# Patient Record
Sex: Female | Born: 1935 | Race: White | Hispanic: No | Marital: Married | Smoking: Never smoker
Health system: Southern US, Community
[De-identification: ages and names within clinical notes are randomized; demographics above are authoritative.]

## PROBLEM LIST (undated history)

## (undated) DIAGNOSIS — I890 Lymphedema, not elsewhere classified: Secondary | ICD-10-CM

## (undated) DIAGNOSIS — E139 Other specified diabetes mellitus without complications: Secondary | ICD-10-CM

## (undated) DIAGNOSIS — E7800 Pure hypercholesterolemia, unspecified: Secondary | ICD-10-CM

## (undated) DIAGNOSIS — K5730 Diverticulosis of large intestine without perforation or abscess without bleeding: Secondary | ICD-10-CM

## (undated) DIAGNOSIS — K635 Polyp of colon: Secondary | ICD-10-CM

## (undated) DIAGNOSIS — J45909 Unspecified asthma, uncomplicated: Secondary | ICD-10-CM

## (undated) DIAGNOSIS — R0989 Other specified symptoms and signs involving the circulatory and respiratory systems: Secondary | ICD-10-CM

## (undated) DIAGNOSIS — K8020 Calculus of gallbladder without cholecystitis without obstruction: Secondary | ICD-10-CM

## (undated) DIAGNOSIS — C50519 Malignant neoplasm of lower-outer quadrant of unspecified female breast: Secondary | ICD-10-CM

## (undated) DIAGNOSIS — M797 Fibromyalgia: Secondary | ICD-10-CM

## (undated) DIAGNOSIS — K449 Diaphragmatic hernia without obstruction or gangrene: Secondary | ICD-10-CM

## (undated) DIAGNOSIS — N952 Postmenopausal atrophic vaginitis: Secondary | ICD-10-CM

## (undated) DIAGNOSIS — G588 Other specified mononeuropathies: Secondary | ICD-10-CM

## (undated) DIAGNOSIS — C259 Malignant neoplasm of pancreas, unspecified: Secondary | ICD-10-CM

## (undated) DIAGNOSIS — I2510 Atherosclerotic heart disease of native coronary artery without angina pectoris: Secondary | ICD-10-CM

## (undated) DIAGNOSIS — K589 Irritable bowel syndrome without diarrhea: Secondary | ICD-10-CM

## (undated) DIAGNOSIS — G4730 Sleep apnea, unspecified: Secondary | ICD-10-CM

## (undated) DIAGNOSIS — K219 Gastro-esophageal reflux disease without esophagitis: Secondary | ICD-10-CM

## (undated) DIAGNOSIS — M47812 Spondylosis without myelopathy or radiculopathy, cervical region: Secondary | ICD-10-CM

## (undated) DIAGNOSIS — R51 Headache: Secondary | ICD-10-CM

## (undated) DIAGNOSIS — H409 Unspecified glaucoma: Secondary | ICD-10-CM

## (undated) HISTORY — PX: COLONOSCOPY: SHX174

## (undated) HISTORY — DX: Other specified mononeuropathies: G58.8

## (undated) HISTORY — PX: CORONARY ANGIOPLASTY WITH STENT PLACEMENT: SHX49

## (undated) HISTORY — DX: Lymphedema, not elsewhere classified: I89.0

## (undated) HISTORY — DX: Irritable bowel syndrome, unspecified: K58.9

## (undated) HISTORY — PX: ESOPHAGOGASTRODUODENOSCOPY: SHX1529

## (undated) HISTORY — DX: Fibromyalgia: M79.7

## (undated) HISTORY — DX: Headache: R51

## (undated) HISTORY — DX: Polyp of colon: K63.5

## (undated) HISTORY — DX: Unspecified glaucoma: H40.9

## (undated) HISTORY — DX: Atherosclerotic heart disease of native coronary artery without angina pectoris: I25.10

## (undated) HISTORY — DX: Gastro-esophageal reflux disease without esophagitis: K21.9

## (undated) HISTORY — PX: CORONARY ARTERY BYPASS GRAFT: SHX141

## (undated) HISTORY — DX: Pure hypercholesterolemia, unspecified: E78.00

## (undated) HISTORY — DX: Postmenopausal atrophic vaginitis: N95.2

## (undated) HISTORY — PX: CATARACT EXTRACTION, BILATERAL: SHX1313

## (undated) HISTORY — PX: OTHER SURGICAL HISTORY: SHX169

## (undated) HISTORY — DX: Diverticulosis of large intestine without perforation or abscess without bleeding: K57.30

## (undated) HISTORY — DX: Malignant neoplasm of lower-outer quadrant of unspecified female breast: C50.519

## (undated) HISTORY — DX: Diaphragmatic hernia without obstruction or gangrene: K44.9

## (undated) SURGERY — Surgical Case
Anesthesia: *Unknown

## (undated) SURGERY — UPPER ESOPHAGEAL ENDOSCOPIC ULTRASOUND (EUS)
Anesthesia: General

---
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
## 1998-01-10 ENCOUNTER — Other Ambulatory Visit: Admission: RE | Admit: 1998-01-10 | Discharge: 1998-01-10 | Payer: Self-pay | Admitting: *Deleted

## 1999-01-01 ENCOUNTER — Other Ambulatory Visit: Admission: RE | Admit: 1999-01-01 | Discharge: 1999-01-01 | Payer: Self-pay | Admitting: *Deleted

## 2000-04-08 ENCOUNTER — Emergency Department (HOSPITAL_COMMUNITY): Admission: EM | Admit: 2000-04-08 | Discharge: 2000-04-08 | Payer: Self-pay | Admitting: Emergency Medicine

## 2000-04-11 ENCOUNTER — Emergency Department (HOSPITAL_COMMUNITY): Admission: EM | Admit: 2000-04-11 | Discharge: 2000-04-11 | Payer: Self-pay | Admitting: Emergency Medicine

## 2000-04-11 ENCOUNTER — Encounter: Payer: Self-pay | Admitting: Emergency Medicine

## 2000-04-15 ENCOUNTER — Other Ambulatory Visit: Admission: RE | Admit: 2000-04-15 | Discharge: 2000-04-15 | Payer: Self-pay | Admitting: *Deleted

## 2000-06-26 ENCOUNTER — Ambulatory Visit (HOSPITAL_COMMUNITY): Admission: RE | Admit: 2000-06-26 | Discharge: 2000-06-26 | Payer: Self-pay | Admitting: Pulmonary Disease

## 2000-06-26 ENCOUNTER — Encounter: Payer: Self-pay | Admitting: Pulmonary Disease

## 2000-12-02 ENCOUNTER — Encounter: Payer: Self-pay | Admitting: Pulmonary Disease

## 2000-12-02 ENCOUNTER — Ambulatory Visit (HOSPITAL_COMMUNITY): Admission: RE | Admit: 2000-12-02 | Discharge: 2000-12-02 | Payer: Self-pay | Admitting: Pulmonary Disease

## 2000-12-18 ENCOUNTER — Encounter: Payer: Self-pay | Admitting: Gastroenterology

## 2001-03-30 ENCOUNTER — Encounter: Payer: Self-pay | Admitting: Gastroenterology

## 2001-11-03 ENCOUNTER — Encounter: Payer: Self-pay | Admitting: Gastroenterology

## 2001-11-05 ENCOUNTER — Other Ambulatory Visit: Admission: RE | Admit: 2001-11-05 | Discharge: 2001-11-05 | Payer: Self-pay | Admitting: *Deleted

## 2003-03-01 ENCOUNTER — Encounter: Payer: Self-pay | Admitting: Gastroenterology

## 2003-03-16 ENCOUNTER — Other Ambulatory Visit: Admission: RE | Admit: 2003-03-16 | Discharge: 2003-03-16 | Payer: Self-pay | Admitting: *Deleted

## 2004-02-01 ENCOUNTER — Ambulatory Visit: Payer: Self-pay | Admitting: Pulmonary Disease

## 2004-03-20 ENCOUNTER — Ambulatory Visit: Payer: Self-pay | Admitting: Pulmonary Disease

## 2004-06-14 ENCOUNTER — Ambulatory Visit: Payer: Self-pay | Admitting: Pulmonary Disease

## 2004-07-05 ENCOUNTER — Ambulatory Visit: Payer: Self-pay | Admitting: Internal Medicine

## 2004-08-09 ENCOUNTER — Ambulatory Visit: Payer: Self-pay | Admitting: Pulmonary Disease

## 2004-08-30 ENCOUNTER — Ambulatory Visit: Payer: Self-pay | Admitting: Internal Medicine

## 2004-09-05 ENCOUNTER — Ambulatory Visit: Payer: Self-pay | Admitting: Cardiology

## 2004-11-29 ENCOUNTER — Ambulatory Visit: Payer: Self-pay | Admitting: Pulmonary Disease

## 2004-12-06 ENCOUNTER — Ambulatory Visit: Payer: Self-pay | Admitting: Cardiology

## 2005-01-22 ENCOUNTER — Ambulatory Visit: Payer: Self-pay | Admitting: Pulmonary Disease

## 2005-01-24 ENCOUNTER — Ambulatory Visit: Payer: Self-pay | Admitting: Cardiology

## 2005-03-06 ENCOUNTER — Ambulatory Visit: Payer: Self-pay | Admitting: Pulmonary Disease

## 2005-04-04 ENCOUNTER — Ambulatory Visit: Payer: Self-pay | Admitting: Pulmonary Disease

## 2005-05-01 ENCOUNTER — Ambulatory Visit: Payer: Self-pay | Admitting: Pulmonary Disease

## 2005-07-09 ENCOUNTER — Ambulatory Visit: Payer: Self-pay | Admitting: Pulmonary Disease

## 2005-07-11 ENCOUNTER — Ambulatory Visit: Payer: Self-pay | Admitting: Cardiology

## 2005-08-06 ENCOUNTER — Ambulatory Visit: Payer: Self-pay | Admitting: Pulmonary Disease

## 2005-08-08 ENCOUNTER — Ambulatory Visit: Payer: Self-pay | Admitting: Cardiovascular Disease

## 2005-08-12 ENCOUNTER — Ambulatory Visit: Payer: Self-pay | Admitting: Cardiovascular Disease

## 2005-08-12 ENCOUNTER — Inpatient Hospital Stay (HOSPITAL_BASED_OUTPATIENT_CLINIC_OR_DEPARTMENT_OTHER): Admission: RE | Admit: 2005-08-12 | Discharge: 2005-08-12 | Payer: Self-pay | Admitting: Cardiovascular Disease

## 2005-08-13 ENCOUNTER — Inpatient Hospital Stay (HOSPITAL_COMMUNITY): Admission: RE | Admit: 2005-08-13 | Discharge: 2005-08-16 | Payer: Self-pay | Admitting: Cardiology

## 2005-08-22 ENCOUNTER — Ambulatory Visit: Payer: Self-pay | Admitting: Cardiology

## 2005-08-22 ENCOUNTER — Inpatient Hospital Stay (HOSPITAL_COMMUNITY): Admission: EM | Admit: 2005-08-22 | Discharge: 2005-08-24 | Payer: Self-pay | Admitting: Emergency Medicine

## 2005-08-29 ENCOUNTER — Ambulatory Visit: Payer: Self-pay | Admitting: Pulmonary Disease

## 2005-08-29 ENCOUNTER — Ambulatory Visit: Payer: Self-pay | Admitting: Cardiovascular Disease

## 2005-09-05 ENCOUNTER — Ambulatory Visit: Payer: Self-pay | Admitting: Cardiovascular Disease

## 2005-09-05 ENCOUNTER — Ambulatory Visit: Payer: Self-pay | Admitting: Cardiology

## 2005-09-19 ENCOUNTER — Encounter (HOSPITAL_COMMUNITY): Admission: RE | Admit: 2005-09-19 | Discharge: 2005-12-18 | Payer: Self-pay | Admitting: Cardiovascular Disease

## 2005-09-19 ENCOUNTER — Ambulatory Visit: Payer: Self-pay | Admitting: Gastroenterology

## 2005-09-26 ENCOUNTER — Ambulatory Visit: Payer: Self-pay | Admitting: Gastroenterology

## 2005-10-03 ENCOUNTER — Ambulatory Visit: Payer: Self-pay | Admitting: Pulmonary Disease

## 2005-10-09 ENCOUNTER — Encounter (INDEPENDENT_AMBULATORY_CARE_PROVIDER_SITE_OTHER): Payer: Self-pay | Admitting: Specialist

## 2005-10-09 ENCOUNTER — Ambulatory Visit: Payer: Self-pay | Admitting: Gastroenterology

## 2005-10-23 ENCOUNTER — Ambulatory Visit: Payer: Self-pay | Admitting: Cardiology

## 2005-10-23 ENCOUNTER — Inpatient Hospital Stay (HOSPITAL_COMMUNITY): Admission: EM | Admit: 2005-10-23 | Discharge: 2005-10-24 | Payer: Self-pay | Admitting: Emergency Medicine

## 2005-11-19 ENCOUNTER — Ambulatory Visit: Payer: Self-pay | Admitting: Cardiovascular Disease

## 2005-11-28 ENCOUNTER — Ambulatory Visit: Payer: Self-pay | Admitting: Cardiology

## 2005-11-29 ENCOUNTER — Ambulatory Visit: Payer: Self-pay | Admitting: Cardiovascular Disease

## 2005-12-19 ENCOUNTER — Ambulatory Visit: Payer: Self-pay | Admitting: Pulmonary Disease

## 2005-12-19 ENCOUNTER — Encounter (HOSPITAL_COMMUNITY): Admission: RE | Admit: 2005-12-19 | Discharge: 2006-03-19 | Payer: Self-pay | Admitting: Cardiovascular Disease

## 2006-02-28 ENCOUNTER — Ambulatory Visit: Payer: Self-pay | Admitting: Cardiovascular Disease

## 2006-02-28 LAB — CONVERTED CEMR LAB
ALT: 19 units/L (ref 0–40)
AST: 19 units/L (ref 0–37)
Albumin: 3.8 g/dL (ref 3.5–5.2)
Alkaline Phosphatase: 80 units/L (ref 39–117)
Bilirubin, Direct: 0.2 mg/dL (ref 0.0–0.3)
Chol/HDL Ratio, serum: 3.3
Cholesterol: 221 mg/dL (ref 0–200)
HDL: 67.4 mg/dL (ref >39.0)
LDL DIRECT: 123.3 mg/dL
Total Bilirubin: 1.1 mg/dL (ref 0.3–1.2)
Total Protein: 7.3 g/dL (ref 6.0–8.3)
Triglyceride fasting, serum: 204 mg/dL (ref 0–149)
VLDL: 41 mg/dL — ABNORMAL HIGH (ref 0–40)

## 2006-03-04 ENCOUNTER — Ambulatory Visit: Payer: Self-pay | Admitting: Pulmonary Disease

## 2006-03-06 ENCOUNTER — Ambulatory Visit (HOSPITAL_COMMUNITY): Admission: RE | Admit: 2006-03-06 | Discharge: 2006-03-06 | Payer: Self-pay | Admitting: Pulmonary Disease

## 2006-03-10 ENCOUNTER — Ambulatory Visit: Payer: Self-pay | Admitting: Cardiology

## 2006-04-01 ENCOUNTER — Ambulatory Visit: Payer: Self-pay | Admitting: Pulmonary Disease

## 2006-04-10 ENCOUNTER — Ambulatory Visit: Payer: Self-pay | Admitting: Internal Medicine

## 2006-05-21 ENCOUNTER — Ambulatory Visit: Payer: Self-pay | Admitting: Cardiology

## 2006-05-21 LAB — CONVERTED CEMR LAB
ALT: 21 units/L (ref 0–40)
AST: 20 units/L (ref 0–37)
Albumin: 3.6 g/dL (ref 3.5–5.2)
Alkaline Phosphatase: 67 units/L (ref 39–117)
Bilirubin, Direct: 0.1 mg/dL (ref 0.0–0.3)
Cholesterol: 233 mg/dL (ref 0–200)
Direct LDL: 140.4 mg/dL
HDL: 65 mg/dL (ref >39.0)
Total Bilirubin: 1.1 mg/dL (ref 0.3–1.2)
Total CHOL/HDL Ratio: 3.6
Total Protein: 6.9 g/dL (ref 6.0–8.3)
Triglycerides: 203 mg/dL (ref 0–149)
VLDL: 41 mg/dL — ABNORMAL HIGH (ref 0–40)

## 2006-05-29 ENCOUNTER — Ambulatory Visit: Payer: Self-pay | Admitting: Cardiology

## 2006-07-10 ENCOUNTER — Ambulatory Visit: Payer: Self-pay | Admitting: Pulmonary Disease

## 2006-08-18 ENCOUNTER — Ambulatory Visit: Payer: Self-pay | Admitting: Cardiovascular Disease

## 2006-09-08 ENCOUNTER — Ambulatory Visit: Payer: Self-pay | Admitting: Pulmonary Disease

## 2006-09-11 ENCOUNTER — Ambulatory Visit: Payer: Self-pay | Admitting: Cardiovascular Disease

## 2006-09-11 LAB — CONVERTED CEMR LAB
ALT: 22 units/L (ref 0–35)
AST: 25 units/L (ref 0–37)
Albumin: 3.8 g/dL (ref 3.5–5.2)
Alkaline Phosphatase: 87 units/L (ref 39–117)
BUN: 11 mg/dL (ref 6–23)
CO2: 32 meq/L (ref 19–32)
Calcium: 9.4 mg/dL (ref 8.4–10.5)
Chloride: 98 meq/L (ref 96–112)
Cholesterol: 217 mg/dL (ref 0–200)
Creatinine, Ser: 0.6 mg/dL (ref 0.4–1.2)
Direct LDL: 149.6 mg/dL
GFR calc Af Amer: 127 mL/min
GFR calc non Af Amer: 105 mL/min
Glucose, Bld: 90 mg/dL (ref 70–99)
HDL: 45.7 mg/dL (ref >39.0)
Potassium: 4.1 meq/L (ref 3.5–5.1)
Sodium: 136 meq/L (ref 135–145)
Total Bilirubin: 1.2 mg/dL (ref 0.3–1.2)
Total CHOL/HDL Ratio: 4.7
Total Protein: 7 g/dL (ref 6.0–8.3)
Triglycerides: 236 mg/dL (ref 0–149)
VLDL: 47 mg/dL — ABNORMAL HIGH (ref 0–40)

## 2006-09-18 ENCOUNTER — Ambulatory Visit: Payer: Self-pay | Admitting: Cardiology

## 2006-10-16 ENCOUNTER — Ambulatory Visit: Payer: Self-pay | Admitting: Cardiology

## 2006-10-22 ENCOUNTER — Ambulatory Visit: Payer: Self-pay | Admitting: Cardiovascular Disease

## 2006-11-04 ENCOUNTER — Ambulatory Visit: Payer: Self-pay

## 2006-11-21 ENCOUNTER — Ambulatory Visit: Payer: Self-pay | Admitting: Cardiovascular Disease

## 2006-11-21 LAB — CONVERTED CEMR LAB
BUN: 12 mg/dL (ref 6–23)
Basophils Absolute: 0.1 10*3/uL (ref 0.0–0.1)
Basophils Relative: 0.7 % (ref 0.0–1.0)
CO2: 31 meq/L (ref 19–32)
Calcium: 9.7 mg/dL (ref 8.4–10.5)
Chloride: 102 meq/L (ref 96–112)
Creatinine, Ser: 0.7 mg/dL (ref 0.4–1.2)
Eosinophils Absolute: 0.2 10*3/uL (ref 0.0–0.6)
Eosinophils Relative: 3.3 % (ref 0.0–5.0)
GFR calc Af Amer: 107 mL/min
GFR calc non Af Amer: 88 mL/min
Glucose, Bld: 98 mg/dL (ref 70–99)
HCT: 36.4 % (ref 36.0–46.0)
Hemoglobin: 12.6 g/dL (ref 12.0–15.0)
INR: 0.8 (ref 0.8–1.0)
Lymphocytes Relative: 51.4 % — ABNORMAL HIGH (ref 12.0–46.0)
MCHC: 34.5 g/dL (ref 30.0–36.0)
MCV: 87.5 fL (ref 78.0–100.0)
Monocytes Absolute: 0.7 10*3/uL (ref 0.2–0.7)
Monocytes Relative: 9.5 % (ref 3.0–11.0)
Neutro Abs: 2.6 10*3/uL (ref 1.4–7.7)
Neutrophils Relative %: 35.1 % — ABNORMAL LOW (ref 43.0–77.0)
Platelets: 298 10*3/uL (ref 150–400)
Potassium: 3.8 meq/L (ref 3.5–5.1)
Prothrombin Time: 10.8 s — ABNORMAL LOW (ref 10.9–13.3)
RBC: 4.16 M/uL (ref 3.87–5.11)
RDW: 13.4 % (ref 11.5–14.6)
Sodium: 138 meq/L (ref 135–145)
WBC: 7.5 10*3/uL (ref 4.5–10.5)
aPTT: 20.4 s — ABNORMAL LOW (ref 21.7–29.8)

## 2006-11-27 ENCOUNTER — Inpatient Hospital Stay (HOSPITAL_BASED_OUTPATIENT_CLINIC_OR_DEPARTMENT_OTHER): Admission: RE | Admit: 2006-11-27 | Discharge: 2006-11-27 | Payer: Self-pay | Admitting: Cardiovascular Disease

## 2006-11-27 ENCOUNTER — Ambulatory Visit: Payer: Self-pay | Admitting: Cardiovascular Disease

## 2006-12-10 ENCOUNTER — Ambulatory Visit: Payer: Self-pay | Admitting: Cardiovascular Disease

## 2006-12-23 DIAGNOSIS — K589 Irritable bowel syndrome without diarrhea: Secondary | ICD-10-CM

## 2006-12-23 DIAGNOSIS — IMO0001 Reserved for inherently not codable concepts without codable children: Secondary | ICD-10-CM

## 2006-12-23 DIAGNOSIS — J45909 Unspecified asthma, uncomplicated: Secondary | ICD-10-CM

## 2006-12-23 DIAGNOSIS — I2510 Atherosclerotic heart disease of native coronary artery without angina pectoris: Secondary | ICD-10-CM

## 2006-12-23 DIAGNOSIS — E782 Mixed hyperlipidemia: Secondary | ICD-10-CM

## 2006-12-23 DIAGNOSIS — H409 Unspecified glaucoma: Secondary | ICD-10-CM | POA: Insufficient documentation

## 2006-12-23 DIAGNOSIS — K219 Gastro-esophageal reflux disease without esophagitis: Secondary | ICD-10-CM

## 2007-02-18 ENCOUNTER — Ambulatory Visit: Payer: Self-pay | Admitting: Pulmonary Disease

## 2007-02-18 DIAGNOSIS — R51 Headache: Secondary | ICD-10-CM

## 2007-02-18 DIAGNOSIS — J309 Allergic rhinitis, unspecified: Secondary | ICD-10-CM

## 2007-02-18 DIAGNOSIS — R42 Dizziness and giddiness: Secondary | ICD-10-CM

## 2007-03-11 ENCOUNTER — Ambulatory Visit: Payer: Self-pay | Admitting: Pulmonary Disease

## 2007-03-16 ENCOUNTER — Ambulatory Visit: Payer: Self-pay | Admitting: Pulmonary Disease

## 2007-03-17 LAB — CONVERTED CEMR LAB
ALT: 20 units/L (ref 0–35)
AST: 19 units/L (ref 0–37)
Albumin: 3.7 g/dL (ref 3.5–5.2)
Alkaline Phosphatase: 77 units/L (ref 39–117)
BUN: 13 mg/dL (ref 6–23)
Basophils Absolute: 0 10*3/uL (ref 0.0–0.1)
Basophils Relative: 0.7 % (ref 0.0–1.0)
Bilirubin, Direct: 0.1 mg/dL (ref 0.0–0.3)
CO2: 31 meq/L (ref 19–32)
Calcium: 9.3 mg/dL (ref 8.4–10.5)
Chloride: 100 meq/L (ref 96–112)
Cholesterol: 222 mg/dL (ref 0–200)
Creatinine, Ser: 0.8 mg/dL (ref 0.4–1.2)
Direct LDL: 148.5 mg/dL
Eosinophils Absolute: 0.2 10*3/uL (ref 0.0–0.6)
Eosinophils Relative: 3.4 % (ref 0.0–5.0)
GFR calc Af Amer: 91 mL/min
GFR calc non Af Amer: 76 mL/min
Glucose, Bld: 91 mg/dL (ref 70–99)
HCT: 38.8 % (ref 36.0–46.0)
HDL: 46.2 mg/dL (ref >39.0)
Hemoglobin: 13.4 g/dL (ref 12.0–15.0)
Lymphocytes Relative: 53 % — ABNORMAL HIGH (ref 12.0–46.0)
MCHC: 34.6 g/dL (ref 30.0–36.0)
MCV: 87.5 fL (ref 78.0–100.0)
Monocytes Absolute: 0.5 10*3/uL (ref 0.2–0.7)
Monocytes Relative: 8.5 % (ref 3.0–11.0)
Neutro Abs: 2 10*3/uL (ref 1.4–7.7)
Neutrophils Relative %: 34.4 % — ABNORMAL LOW (ref 43.0–77.0)
Platelets: 292 10*3/uL (ref 150–400)
Potassium: 4.5 meq/L (ref 3.5–5.1)
RBC: 4.43 M/uL (ref 3.87–5.11)
RDW: 13.2 % (ref 11.5–14.6)
Sodium: 137 meq/L (ref 135–145)
TSH: 2.27 microintl units/mL (ref 0.35–5.50)
Total Bilirubin: 1.1 mg/dL (ref 0.3–1.2)
Total CHOL/HDL Ratio: 4.8
Total Protein: 6.9 g/dL (ref 6.0–8.3)
Triglycerides: 199 mg/dL — ABNORMAL HIGH (ref 0–149)
VLDL: 40 mg/dL (ref 0–40)
WBC: 5.9 10*3/uL (ref 4.5–10.5)

## 2007-03-31 ENCOUNTER — Ambulatory Visit: Payer: Self-pay | Admitting: Cardiovascular Disease

## 2007-04-02 ENCOUNTER — Ambulatory Visit (HOSPITAL_COMMUNITY): Admission: RE | Admit: 2007-04-02 | Discharge: 2007-04-02 | Payer: Self-pay | Admitting: Cardiovascular Disease

## 2007-04-09 ENCOUNTER — Ambulatory Visit: Payer: Self-pay | Admitting: Cardiology

## 2007-06-25 ENCOUNTER — Ambulatory Visit: Payer: Self-pay | Admitting: Cardiovascular Disease

## 2007-06-25 LAB — CONVERTED CEMR LAB
ALT: 23 units/L (ref 0–35)
AST: 26 units/L (ref 0–37)
Albumin: 3.9 g/dL (ref 3.5–5.2)
Alkaline Phosphatase: 67 units/L (ref 39–117)
Bilirubin, Direct: 0.1 mg/dL (ref 0.0–0.3)
Cholesterol: 250 mg/dL (ref 0–200)
Direct LDL: 169.6 mg/dL
HDL: 48.4 mg/dL (ref >39.0)
Total Bilirubin: 1 mg/dL (ref 0.3–1.2)
Total CHOL/HDL Ratio: 5.2
Total Protein: 7 g/dL (ref 6.0–8.3)
Triglycerides: 289 mg/dL (ref 0–149)
VLDL: 58 mg/dL — ABNORMAL HIGH (ref 0–40)

## 2007-07-02 ENCOUNTER — Ambulatory Visit: Payer: Self-pay | Admitting: Internal Medicine

## 2007-08-04 ENCOUNTER — Other Ambulatory Visit: Admission: RE | Admit: 2007-08-04 | Discharge: 2007-08-04 | Payer: Self-pay | Admitting: Obstetrics & Gynecology

## 2007-08-12 ENCOUNTER — Ambulatory Visit: Payer: Self-pay | Admitting: Cardiovascular Disease

## 2007-08-12 LAB — CONVERTED CEMR LAB
Cholesterol: 233 mg/dL (ref 0–200)
Direct LDL: 149.7 mg/dL
HDL: 45.6 mg/dL (ref >39.0)
Total CHOL/HDL Ratio: 5.1
Triglycerides: 263 mg/dL (ref 0–149)
VLDL: 53 mg/dL — ABNORMAL HIGH (ref 0–40)

## 2007-08-20 ENCOUNTER — Ambulatory Visit: Payer: Self-pay | Admitting: Cardiology

## 2007-09-08 ENCOUNTER — Ambulatory Visit: Payer: Self-pay | Admitting: Pulmonary Disease

## 2007-09-13 LAB — CONVERTED CEMR LAB
ALT: 20 units/L (ref 0–35)
AST: 23 units/L (ref 0–37)
Albumin: 3.8 g/dL (ref 3.5–5.2)
Alkaline Phosphatase: 70 units/L (ref 39–117)
BUN: 16 mg/dL (ref 6–23)
Basophils Absolute: 0.1 10*3/uL (ref 0.0–0.1)
Basophils Relative: 1 % (ref 0.0–1.0)
Bilirubin, Direct: 0.1 mg/dL (ref 0.0–0.3)
CO2: 30 meq/L (ref 19–32)
Calcium: 9.5 mg/dL (ref 8.4–10.5)
Chloride: 101 meq/L (ref 96–112)
Creatinine, Ser: 0.8 mg/dL (ref 0.4–1.2)
Eosinophils Absolute: 0.3 10*3/uL (ref 0.0–0.7)
Eosinophils Relative: 3.6 % (ref 0.0–5.0)
GFR calc Af Amer: 91 mL/min
GFR calc non Af Amer: 75 mL/min
Glucose, Bld: 140 mg/dL — ABNORMAL HIGH (ref 70–99)
HCT: 37.5 % (ref 36.0–46.0)
Hemoglobin: 12.8 g/dL (ref 12.0–15.0)
Lymphocytes Relative: 50.9 % — ABNORMAL HIGH (ref 12.0–46.0)
MCHC: 34 g/dL (ref 30.0–36.0)
MCV: 89.9 fL (ref 78.0–100.0)
Monocytes Absolute: 0.6 10*3/uL (ref 0.1–1.0)
Monocytes Relative: 8.4 % (ref 3.0–12.0)
Neutro Abs: 2.6 10*3/uL (ref 1.4–7.7)
Neutrophils Relative %: 36.1 % — ABNORMAL LOW (ref 43.0–77.0)
Platelets: 285 10*3/uL (ref 150–400)
Potassium: 4.3 meq/L (ref 3.5–5.1)
RBC: 4.17 M/uL (ref 3.87–5.11)
RDW: 13.6 % (ref 11.5–14.6)
Sodium: 138 meq/L (ref 135–145)
TSH: 1.46 microintl units/mL (ref 0.35–5.50)
Total Bilirubin: 0.9 mg/dL (ref 0.3–1.2)
Total Protein: 6.8 g/dL (ref 6.0–8.3)
WBC: 7.2 10*3/uL (ref 4.5–10.5)

## 2007-10-15 ENCOUNTER — Ambulatory Visit: Payer: Self-pay | Admitting: Cardiovascular Disease

## 2007-10-15 LAB — CONVERTED CEMR LAB
Cholesterol: 241 mg/dL (ref 0–200)
Direct LDL: 124.1 mg/dL
HDL: 34.5 mg/dL — ABNORMAL LOW (ref >39.0)
Total CHOL/HDL Ratio: 7
Total CK: 104 units/L (ref 7–177)
Triglycerides: 382 mg/dL (ref 0–149)
VLDL: 76 mg/dL — ABNORMAL HIGH (ref 0–40)

## 2007-11-12 ENCOUNTER — Ambulatory Visit: Payer: Self-pay

## 2007-11-12 ENCOUNTER — Encounter: Payer: Self-pay | Admitting: Pulmonary Disease

## 2007-11-19 ENCOUNTER — Telehealth (INDEPENDENT_AMBULATORY_CARE_PROVIDER_SITE_OTHER): Payer: Self-pay | Admitting: *Deleted

## 2007-11-25 ENCOUNTER — Ambulatory Visit: Payer: Self-pay | Admitting: Cardiovascular Disease

## 2007-11-25 ENCOUNTER — Ambulatory Visit: Payer: Self-pay | Admitting: Pulmonary Disease

## 2007-12-16 DIAGNOSIS — K449 Diaphragmatic hernia without obstruction or gangrene: Secondary | ICD-10-CM | POA: Insufficient documentation

## 2007-12-16 DIAGNOSIS — Z860100 Personal history of colon polyps, unspecified: Secondary | ICD-10-CM | POA: Insufficient documentation

## 2007-12-16 DIAGNOSIS — K2970 Gastritis, unspecified, without bleeding: Secondary | ICD-10-CM | POA: Insufficient documentation

## 2007-12-16 DIAGNOSIS — K2990 Gastroduodenitis, unspecified, without bleeding: Secondary | ICD-10-CM

## 2007-12-17 ENCOUNTER — Ambulatory Visit: Payer: Self-pay | Admitting: Gastroenterology

## 2007-12-17 LAB — CONVERTED CEMR LAB
BUN: 12 mg/dL (ref 6–23)
Basophils Absolute: 0.1 10*3/uL (ref 0.0–0.1)
Basophils Relative: 1 % (ref 0.0–3.0)
Creatinine, Ser: 0.7 mg/dL (ref 0.4–1.2)
Eosinophils Absolute: 0.2 10*3/uL (ref 0.0–0.7)
Eosinophils Relative: 1.7 % (ref 0.0–5.0)
HCT: 39.1 % (ref 36.0–46.0)
Hemoglobin: 13.3 g/dL (ref 12.0–15.0)
Lymphocytes Relative: 25 % (ref 12.0–46.0)
MCHC: 34 g/dL (ref 30.0–36.0)
MCV: 89.6 fL (ref 78.0–100.0)
Monocytes Absolute: 1.1 10*3/uL — ABNORMAL HIGH (ref 0.1–1.0)
Monocytes Relative: 10 % (ref 3.0–12.0)
Neutro Abs: 7.1 10*3/uL (ref 1.4–7.7)
Neutrophils Relative %: 62.3 % (ref 43.0–77.0)
Platelets: 266 10*3/uL (ref 150–400)
RBC: 4.36 M/uL (ref 3.87–5.11)
RDW: 13.8 % (ref 11.5–14.6)
Sed Rate: 25 mm/hr — ABNORMAL HIGH (ref 0–22)
WBC: 11.3 10*3/uL — ABNORMAL HIGH (ref 4.5–10.5)

## 2007-12-21 ENCOUNTER — Telehealth: Payer: Self-pay | Admitting: Gastroenterology

## 2007-12-22 ENCOUNTER — Ambulatory Visit: Payer: Self-pay | Admitting: Cardiology

## 2007-12-24 ENCOUNTER — Telehealth: Payer: Self-pay | Admitting: Gastroenterology

## 2007-12-31 ENCOUNTER — Ambulatory Visit: Payer: Self-pay | Admitting: Cardiovascular Disease

## 2007-12-31 LAB — CONVERTED CEMR LAB
ALT: 23 units/L (ref 0–35)
AST: 28 units/L (ref 0–37)
Albumin: 4 g/dL (ref 3.5–5.2)
Alkaline Phosphatase: 65 units/L (ref 39–117)
Bilirubin, Direct: 0.2 mg/dL (ref 0.0–0.3)
Cholesterol: 162 mg/dL (ref 0–200)
HDL: 45.3 mg/dL (ref >39.0)
LDL Cholesterol: 83 mg/dL (ref 0–99)
Total Bilirubin: 0.8 mg/dL (ref 0.3–1.2)
Total CHOL/HDL Ratio: 3.6
Total Protein: 7.4 g/dL (ref 6.0–8.3)
Triglycerides: 167 mg/dL — ABNORMAL HIGH (ref 0–149)
VLDL: 33 mg/dL (ref 0–40)

## 2008-01-01 ENCOUNTER — Ambulatory Visit: Payer: Self-pay | Admitting: Gastroenterology

## 2008-01-11 ENCOUNTER — Ambulatory Visit: Payer: Self-pay | Admitting: Cardiology

## 2008-03-09 ENCOUNTER — Ambulatory Visit: Payer: Self-pay | Admitting: Pulmonary Disease

## 2008-04-26 ENCOUNTER — Encounter: Payer: Self-pay | Admitting: Cardiovascular Disease

## 2008-04-26 ENCOUNTER — Ambulatory Visit: Payer: Self-pay | Admitting: Cardiovascular Disease

## 2008-05-03 ENCOUNTER — Ambulatory Visit: Payer: Self-pay | Admitting: Cardiovascular Disease

## 2008-05-03 LAB — CONVERTED CEMR LAB
ALT: 21 units/L (ref 0–35)
AST: 23 units/L (ref 0–37)
Albumin: 3.8 g/dL (ref 3.5–5.2)
Alkaline Phosphatase: 69 units/L (ref 39–117)
Bilirubin, Direct: 0.1 mg/dL (ref 0.0–0.3)
Cholesterol: 239 mg/dL (ref 0–200)
Direct LDL: 137.5 mg/dL
HDL: 56.7 mg/dL (ref >39.0)
Total Bilirubin: 1.2 mg/dL (ref 0.3–1.2)
Total CHOL/HDL Ratio: 4.2
Total Protein: 7 g/dL (ref 6.0–8.3)
Triglycerides: 211 mg/dL (ref 0–149)
VLDL: 42 mg/dL — ABNORMAL HIGH (ref 0–40)

## 2008-05-12 ENCOUNTER — Ambulatory Visit: Payer: Self-pay | Admitting: Internal Medicine

## 2008-09-13 ENCOUNTER — Ambulatory Visit: Payer: Self-pay | Admitting: Cardiovascular Disease

## 2008-09-13 LAB — CONVERTED CEMR LAB
ALT: 24 units/L (ref 0–35)
AST: 21 units/L (ref 0–37)
Albumin: 3.7 g/dL (ref 3.5–5.2)
Alkaline Phosphatase: 76 units/L (ref 39–117)
Bilirubin, Direct: 0.1 mg/dL (ref 0.0–0.3)
Cholesterol: 219 mg/dL — ABNORMAL HIGH (ref 0–200)
Direct LDL: 139.4 mg/dL
HDL: 45.2 mg/dL (ref >39.00)
Total Bilirubin: 1.2 mg/dL (ref 0.3–1.2)
Total CHOL/HDL Ratio: 5
Total Protein: 7 g/dL (ref 6.0–8.3)
Triglycerides: 263 mg/dL — ABNORMAL HIGH (ref 0.0–149.0)
VLDL: 52.6 mg/dL — ABNORMAL HIGH (ref 0.0–40.0)

## 2008-09-19 ENCOUNTER — Encounter: Admission: RE | Admit: 2008-09-19 | Discharge: 2008-12-18 | Payer: Self-pay | Admitting: Cardiovascular Disease

## 2008-09-19 ENCOUNTER — Encounter: Payer: Self-pay | Admitting: Cardiovascular Disease

## 2008-09-22 ENCOUNTER — Ambulatory Visit: Payer: Self-pay | Admitting: Cardiovascular Disease

## 2008-11-04 ENCOUNTER — Ambulatory Visit: Payer: Self-pay | Admitting: Cardiovascular Disease

## 2008-11-28 ENCOUNTER — Encounter (INDEPENDENT_AMBULATORY_CARE_PROVIDER_SITE_OTHER): Payer: Self-pay | Admitting: *Deleted

## 2008-12-28 ENCOUNTER — Ambulatory Visit: Payer: Self-pay | Admitting: Cardiovascular Disease

## 2008-12-29 ENCOUNTER — Ambulatory Visit: Payer: Self-pay | Admitting: Internal Medicine

## 2009-01-05 LAB — CONVERTED CEMR LAB
ALT: 24 units/L (ref 0–35)
AST: 24 units/L (ref 0–37)
Albumin: 4 g/dL (ref 3.5–5.2)
Alkaline Phosphatase: 69 units/L (ref 39–117)
Bilirubin, Direct: 0.1 mg/dL (ref 0.0–0.3)
Cholesterol: 240 mg/dL — ABNORMAL HIGH (ref 0–200)
Direct LDL: 150.1 mg/dL
HDL: 44.9 mg/dL (ref >39.00)
Total Bilirubin: 1.3 mg/dL — ABNORMAL HIGH (ref 0.3–1.2)
Total CHOL/HDL Ratio: 5
Total Protein: 7.4 g/dL (ref 6.0–8.3)
Triglycerides: 271 mg/dL — ABNORMAL HIGH (ref 0.0–149.0)
VLDL: 54.2 mg/dL — ABNORMAL HIGH (ref 0.0–40.0)

## 2009-01-24 ENCOUNTER — Ambulatory Visit: Payer: Self-pay | Admitting: Pulmonary Disease

## 2009-03-22 ENCOUNTER — Encounter (INDEPENDENT_AMBULATORY_CARE_PROVIDER_SITE_OTHER): Payer: Self-pay | Admitting: *Deleted

## 2009-03-23 ENCOUNTER — Encounter (INDEPENDENT_AMBULATORY_CARE_PROVIDER_SITE_OTHER): Payer: Self-pay | Admitting: *Deleted

## 2009-04-11 ENCOUNTER — Ambulatory Visit: Payer: Self-pay | Admitting: Cardiovascular Disease

## 2009-04-11 ENCOUNTER — Encounter (INDEPENDENT_AMBULATORY_CARE_PROVIDER_SITE_OTHER): Payer: Self-pay | Admitting: *Deleted

## 2009-04-11 LAB — CONVERTED CEMR LAB
ALT: 23 units/L (ref 0–35)
AST: 20 units/L (ref 0–37)
Albumin: 3.9 g/dL (ref 3.5–5.2)
Alkaline Phosphatase: 63 units/L (ref 39–117)
Cholesterol: 284 mg/dL — ABNORMAL HIGH (ref 0–200)
Direct LDL: 179.8 mg/dL
Total CHOL/HDL Ratio: 4
Total Protein: 7.1 g/dL (ref 6.0–8.3)
Triglycerides: 156 mg/dL — ABNORMAL HIGH (ref 0.0–149.0)

## 2009-04-12 ENCOUNTER — Ambulatory Visit: Payer: Self-pay | Admitting: Gastroenterology

## 2009-04-13 ENCOUNTER — Emergency Department (HOSPITAL_COMMUNITY): Admission: EM | Admit: 2009-04-13 | Discharge: 2009-04-14 | Payer: Self-pay | Admitting: Emergency Medicine

## 2009-04-13 ENCOUNTER — Ambulatory Visit: Payer: Self-pay | Admitting: Cardiology

## 2009-04-14 ENCOUNTER — Telehealth: Payer: Self-pay | Admitting: Cardiovascular Disease

## 2009-04-19 ENCOUNTER — Telehealth: Payer: Self-pay | Admitting: Cardiovascular Disease

## 2009-04-26 ENCOUNTER — Ambulatory Visit: Payer: Self-pay | Admitting: Gastroenterology

## 2009-04-27 DIAGNOSIS — K635 Polyp of colon: Secondary | ICD-10-CM

## 2009-04-27 HISTORY — DX: Polyp of colon: K63.5

## 2009-04-28 ENCOUNTER — Encounter: Payer: Self-pay | Admitting: Gastroenterology

## 2009-05-02 ENCOUNTER — Ambulatory Visit: Payer: Self-pay | Admitting: Cardiovascular Disease

## 2009-05-02 ENCOUNTER — Encounter (INDEPENDENT_AMBULATORY_CARE_PROVIDER_SITE_OTHER): Payer: Self-pay | Admitting: *Deleted

## 2009-05-02 ENCOUNTER — Telehealth: Payer: Self-pay | Admitting: Cardiovascular Disease

## 2009-05-02 ENCOUNTER — Telehealth (INDEPENDENT_AMBULATORY_CARE_PROVIDER_SITE_OTHER): Payer: Self-pay | Admitting: *Deleted

## 2009-05-04 ENCOUNTER — Ambulatory Visit: Payer: Self-pay | Admitting: Pulmonary Disease

## 2009-05-09 ENCOUNTER — Telehealth (INDEPENDENT_AMBULATORY_CARE_PROVIDER_SITE_OTHER): Payer: Self-pay | Admitting: *Deleted

## 2009-05-10 ENCOUNTER — Ambulatory Visit: Payer: Self-pay

## 2009-05-10 ENCOUNTER — Encounter (HOSPITAL_COMMUNITY): Admission: RE | Admit: 2009-05-10 | Discharge: 2009-06-28 | Payer: Self-pay | Admitting: Cardiovascular Disease

## 2009-05-10 ENCOUNTER — Ambulatory Visit: Payer: Self-pay | Admitting: Cardiology

## 2009-06-27 ENCOUNTER — Ambulatory Visit: Payer: Self-pay | Admitting: Cardiovascular Disease

## 2009-07-19 ENCOUNTER — Telehealth: Payer: Self-pay | Admitting: Pulmonary Disease

## 2009-07-20 ENCOUNTER — Ambulatory Visit: Payer: Self-pay | Admitting: Pulmonary Disease

## 2009-07-25 ENCOUNTER — Ambulatory Visit: Payer: Self-pay | Admitting: Pulmonary Disease

## 2009-07-25 LAB — CONVERTED CEMR LAB
ALT: 30 units/L (ref 0–35)
AST: 25 units/L (ref 0–37)
Albumin: 4 g/dL (ref 3.5–5.2)
Alkaline Phosphatase: 76 units/L (ref 39–117)
BUN: 15 mg/dL (ref 6–23)
Basophils Absolute: 0.1 10*3/uL (ref 0.0–0.1)
Calcium: 9.8 mg/dL (ref 8.4–10.5)
Chloride: 96 meq/L (ref 96–112)
Cholesterol: 277 mg/dL — ABNORMAL HIGH (ref 0–200)
Creatinine, Ser: 0.6 mg/dL (ref 0.4–1.2)
Direct LDL: 193.3 mg/dL
Eosinophils Absolute: 0.2 10*3/uL (ref 0.0–0.7)
GFR calc non Af Amer: 102.51 mL/min (ref >60)
Hemoglobin: 13.2 g/dL (ref 12.0–15.0)
Lymphocytes Relative: 46.8 % — ABNORMAL HIGH (ref 12.0–46.0)
MCHC: 34.4 g/dL (ref 30.0–36.0)
Monocytes Relative: 9.2 % (ref 3.0–12.0)
Neutro Abs: 2.6 10*3/uL (ref 1.4–7.7)
Platelets: 285 10*3/uL (ref 150.0–400.0)
RDW: 13.8 % (ref 11.5–14.6)
TSH: 1.88 microintl units/mL (ref 0.35–5.50)
Total Protein: 7 g/dL (ref 6.0–8.3)
Triglycerides: 241 mg/dL — ABNORMAL HIGH (ref 0.0–149.0)

## 2009-08-03 ENCOUNTER — Ambulatory Visit: Payer: Self-pay | Admitting: Internal Medicine

## 2009-10-11 ENCOUNTER — Ambulatory Visit: Payer: Self-pay | Admitting: Cardiovascular Disease

## 2009-10-11 LAB — CONVERTED CEMR LAB
ALT: 20 units/L (ref 0–35)
AST: 23 units/L (ref 0–37)
Albumin: 4 g/dL (ref 3.5–5.2)
Alkaline Phosphatase: 69 units/L (ref 39–117)
Cholesterol: 222 mg/dL — ABNORMAL HIGH (ref 0–200)
Total Bilirubin: 1 mg/dL (ref 0.3–1.2)
Total CHOL/HDL Ratio: 4
Triglycerides: 270 mg/dL — ABNORMAL HIGH (ref 0.0–149.0)

## 2009-10-12 ENCOUNTER — Ambulatory Visit: Payer: Self-pay | Admitting: Cardiovascular Disease

## 2009-10-16 ENCOUNTER — Telehealth (INDEPENDENT_AMBULATORY_CARE_PROVIDER_SITE_OTHER): Payer: Self-pay | Admitting: *Deleted

## 2009-11-02 ENCOUNTER — Telehealth (INDEPENDENT_AMBULATORY_CARE_PROVIDER_SITE_OTHER): Payer: Self-pay | Admitting: *Deleted

## 2009-11-09 ENCOUNTER — Telehealth (INDEPENDENT_AMBULATORY_CARE_PROVIDER_SITE_OTHER): Payer: Self-pay | Admitting: *Deleted

## 2009-12-04 ENCOUNTER — Ambulatory Visit: Payer: Self-pay | Admitting: Cardiovascular Disease

## 2009-12-05 ENCOUNTER — Encounter (INDEPENDENT_AMBULATORY_CARE_PROVIDER_SITE_OTHER): Payer: Self-pay | Admitting: *Deleted

## 2009-12-05 LAB — CONVERTED CEMR LAB
Alkaline Phosphatase: 73 units/L (ref 39–117)
Bilirubin, Direct: 0.1 mg/dL (ref 0.0–0.3)
HDL: 52.6 mg/dL (ref >39.00)
Total Bilirubin: 1.1 mg/dL (ref 0.3–1.2)
VLDL: 43 mg/dL — ABNORMAL HIGH (ref 0.0–40.0)

## 2009-12-07 ENCOUNTER — Ambulatory Visit: Payer: Self-pay | Admitting: Internal Medicine

## 2009-12-13 ENCOUNTER — Telehealth: Payer: Self-pay | Admitting: Gastroenterology

## 2009-12-13 ENCOUNTER — Telehealth: Payer: Self-pay | Admitting: Cardiovascular Disease

## 2009-12-18 ENCOUNTER — Telehealth (INDEPENDENT_AMBULATORY_CARE_PROVIDER_SITE_OTHER): Payer: Self-pay | Admitting: *Deleted

## 2009-12-19 ENCOUNTER — Encounter: Payer: Self-pay | Admitting: Pulmonary Disease

## 2009-12-19 ENCOUNTER — Ambulatory Visit: Payer: Self-pay | Admitting: Pulmonary Disease

## 2009-12-19 DIAGNOSIS — R071 Chest pain on breathing: Secondary | ICD-10-CM | POA: Insufficient documentation

## 2010-01-02 ENCOUNTER — Ambulatory Visit: Payer: Self-pay | Admitting: Gastroenterology

## 2010-01-02 LAB — CONVERTED CEMR LAB
Ferritin: 49.1 ng/mL (ref 10.0–291.0)
Folate: >20 ng/mL
Iron: 66 ug/dL (ref 42–145)
Saturation Ratios: 15 % — ABNORMAL LOW (ref 20.0–50.0)
Transferrin: 314.4 mg/dL (ref 212.0–360.0)
Vitamin B-12: >1500 pg/mL — ABNORMAL HIGH (ref 211–911)

## 2010-01-03 ENCOUNTER — Ambulatory Visit: Payer: Self-pay | Admitting: Cardiovascular Disease

## 2010-01-09 ENCOUNTER — Ambulatory Visit (HOSPITAL_COMMUNITY): Admission: RE | Admit: 2010-01-09 | Discharge: 2010-01-09 | Payer: Self-pay | Admitting: Gastroenterology

## 2010-01-09 DIAGNOSIS — K8020 Calculus of gallbladder without cholecystitis without obstruction: Secondary | ICD-10-CM | POA: Insufficient documentation

## 2010-01-09 DIAGNOSIS — K7689 Other specified diseases of liver: Secondary | ICD-10-CM | POA: Insufficient documentation

## 2010-01-15 ENCOUNTER — Telehealth (INDEPENDENT_AMBULATORY_CARE_PROVIDER_SITE_OTHER): Payer: Self-pay

## 2010-01-16 ENCOUNTER — Ambulatory Visit: Payer: Self-pay | Admitting: Cardiovascular Disease

## 2010-01-16 ENCOUNTER — Encounter: Payer: Self-pay | Admitting: Cardiovascular Disease

## 2010-01-16 ENCOUNTER — Encounter (HOSPITAL_COMMUNITY)
Admission: RE | Admit: 2010-01-16 | Discharge: 2010-03-27 | Payer: Self-pay | Source: Home / Self Care | Attending: Cardiovascular Disease | Admitting: Cardiovascular Disease

## 2010-01-16 ENCOUNTER — Encounter: Payer: Self-pay | Admitting: Cardiology

## 2010-01-16 ENCOUNTER — Ambulatory Visit: Payer: Self-pay

## 2010-01-16 ENCOUNTER — Encounter (INDEPENDENT_AMBULATORY_CARE_PROVIDER_SITE_OTHER): Payer: Self-pay | Admitting: *Deleted

## 2010-01-16 ENCOUNTER — Ambulatory Visit: Payer: Self-pay | Admitting: Cardiology

## 2010-01-16 LAB — CONVERTED CEMR LAB
BUN: 14 mg/dL (ref 6–23)
Basophils Absolute: 0.1 10*3/uL (ref 0.0–0.1)
Calcium: 9.7 mg/dL (ref 8.4–10.5)
Creatinine, Ser: 0.7 mg/dL (ref 0.4–1.2)
GFR calc non Af Amer: 93.47 mL/min (ref >60)
Glucose, Bld: 137 mg/dL — ABNORMAL HIGH (ref 70–99)
HCT: 38.8 % (ref 36.0–46.0)
INR: 1 (ref 0.8–1.0)
Lymphocytes Relative: 29.1 % (ref 12.0–46.0)
Lymphs Abs: 2.4 10*3/uL (ref 0.7–4.0)
Monocytes Relative: 5.4 % (ref 3.0–12.0)
Neutrophils Relative %: 64.2 % (ref 43.0–77.0)
Platelets: 319 10*3/uL (ref 150.0–400.0)
Prothrombin Time: 10.2 s (ref 9.7–11.8)
RDW: 14.7 % — ABNORMAL HIGH (ref 11.5–14.6)

## 2010-01-17 ENCOUNTER — Encounter: Payer: Self-pay | Admitting: Family Medicine

## 2010-01-17 ENCOUNTER — Inpatient Hospital Stay (HOSPITAL_BASED_OUTPATIENT_CLINIC_OR_DEPARTMENT_OTHER): Admission: RE | Admit: 2010-01-17 | Discharge: 2010-01-17 | Payer: Self-pay | Admitting: Internal Medicine

## 2010-01-17 ENCOUNTER — Ambulatory Visit: Payer: Self-pay | Admitting: Cardiology

## 2010-01-17 ENCOUNTER — Inpatient Hospital Stay (HOSPITAL_COMMUNITY): Admission: EM | Admit: 2010-01-17 | Discharge: 2010-01-24 | Payer: Self-pay | Admitting: Internal Medicine

## 2010-01-17 ENCOUNTER — Encounter: Payer: Self-pay | Admitting: Cardiothoracic Surgery

## 2010-01-17 ENCOUNTER — Encounter: Payer: Self-pay | Admitting: Internal Medicine

## 2010-01-18 ENCOUNTER — Ambulatory Visit: Payer: Self-pay | Admitting: Cardiothoracic Surgery

## 2010-01-31 ENCOUNTER — Encounter: Payer: Self-pay | Admitting: Cardiovascular Disease

## 2010-02-06 ENCOUNTER — Telehealth: Payer: Self-pay | Admitting: Cardiovascular Disease

## 2010-02-13 ENCOUNTER — Ambulatory Visit: Payer: Self-pay | Admitting: Cardiovascular Disease

## 2010-02-15 ENCOUNTER — Encounter: Payer: Self-pay | Admitting: Pulmonary Disease

## 2010-02-15 ENCOUNTER — Encounter: Payer: Self-pay | Admitting: Cardiovascular Disease

## 2010-02-15 ENCOUNTER — Encounter
Admission: RE | Admit: 2010-02-15 | Discharge: 2010-02-15 | Payer: Self-pay | Source: Home / Self Care | Attending: Cardiothoracic Surgery | Admitting: Cardiothoracic Surgery

## 2010-02-15 ENCOUNTER — Ambulatory Visit: Payer: Self-pay | Admitting: Cardiothoracic Surgery

## 2010-02-22 ENCOUNTER — Encounter (HOSPITAL_COMMUNITY)
Admission: RE | Admit: 2010-02-22 | Discharge: 2010-03-27 | Payer: Self-pay | Source: Home / Self Care | Attending: Cardiovascular Disease | Admitting: Cardiovascular Disease

## 2010-03-12 ENCOUNTER — Encounter: Payer: Self-pay | Admitting: Cardiovascular Disease

## 2010-03-14 ENCOUNTER — Telehealth (INDEPENDENT_AMBULATORY_CARE_PROVIDER_SITE_OTHER): Payer: Self-pay | Admitting: *Deleted

## 2010-03-17 ENCOUNTER — Encounter: Payer: Self-pay | Admitting: Gastroenterology

## 2010-03-27 NOTE — Progress Notes (Signed)
Summary: er last night   Phone Note Call from Patient Call back at [facility] Phone [phone]   Caller: Patient Reason for Call: Talk to Nurse Details for Reason: Per pt calling, went to er last night, was told to call an make appt, offer 3/8. pt wanted debra to call her back .  Initial call taken by: Lorne Skeens,  April 14, 2009 10:06 AM  Follow-up for Phone Call        spoke with pt, she was at a meeting last night and felt funny. she had a real bad headache and a fireman at the meeting checked her bp and it was 220/130. she went to the er and there her bp was 156/62. there were no changes made in her meds and she was told to call. at home today her bp is running 164/80-157/82. appt made for pt to be seen in acouple of weeks. will foward to dr Eden Emms for his review Deliah Goody, RN  April 14, 2009 5:45 PM   Additional Follow-up for Phone Call Additional follow up Details #1::        Cozaar 50mg  and F/U with me in a few weeks Additional Follow-up by: Colon Branch, MD, [facility],  April 17, 2009 8:56 AM     Appended Document: er last night pt aware of new med. she has a follow up on 05-02-09. she will call with cont problems Deliah Goody, RN  April 18, 2009 10:51 AM\

## 2010-03-27 NOTE — Progress Notes (Signed)
Summary: Nuc. Pre-Procedure  Phone Note Outgoing Call Call back at [facility] Phone [phone]   Call placed by: Irean Hong, RN,  January 15, 2010 11:00 AM Summary of Call: Left message with information on Myoview Information Sheet (see scanned document for details).      Nuclear Med Background Indications for Stress Test: Evaluation for Ischemia, Stent Patency, PTCA Patency  Indications Comments: .  History: Angioplasty, Asthma, Heart Catheterization, Myocardial Perfusion Study, Stents  History Comments: '07 PTCA/Stent-LAD x 2. '08 Cath:Patent stents with 40% residual in LAD.  05/10/09 UJW:[id number], EF=86%.  Symptoms: Chest Pain  Symptoms Comments: CP radiates to back and shoulder. Cough, and muscle aches in back of neck.   Nuclear Pre-Procedure Cardiac Risk Factors: Hypertension, Lipids Height (in): 59  Nuclear Med Study Referring MD:  Charlton Haws, MD

## 2010-03-27 NOTE — Assessment & Plan Note (Signed)
Summary: F6M/DM   Referring Provider:  Eden Emms Primary Provider:  Alroy Dust, MD   CC:  pt has been having chest pain pt went to see Dr. [name] they did an EKG since them pt has been doing ok..  History of Present Illness: [name] is seen today for F/U of HTN CAD and elevated lipids.;  She had a DES in 2007 to the LAD.  She has 40% residulal stenosis in LAD on cath in 2008 with a patent stent.  Plavix was stopped in May  She is not having any SSCP.  She has a cough.  She has some reactive airway disease but her cough is more persistant. Her cough improved off ACE and she is tolerating ARB.  She has white coat HTN and her BP is much better when taken at home.   She saw a nutritionist  but has not been very good with her diet eating too much fried food. She is intolerant to statins, vytorin and niaspan.  Her LDL is suboptimal but the best we have been able to do is flaxseed/fish oil with diet control.    She has a myriad of somatic complaints  She has muscle aching in the back of the neck.  She has had SSCP that is not always exertional and radiates to the back and shoulder.  Took nitro but this only gave her a headache.  Increasingly difficult to walk due to heel spurs.    Current Problems (verified): 1)  Chest Wall Pain, Acute  (ICD-786.52) 2)  Glaucoma  (ICD-365.9) 3)  Allergic Rhinitis  (ICD-477.9) 4)  Asthma  (ICD-493.90) 5)  Hypertension  (ICD-401.9) 6)  Coronary Artery Disease  (ICD-414.00) 7)  Hyperlipidemia  (ICD-272.4) 8)  Hiatal Hernia  (ICD-553.3) 9)  Gerd  (ICD-530.81) 10)  Gastritis  (ICD-535.50) 11)  Hx of Abdominal Pain, Left Lower Quadrant  (ICD-789.04) 12)  Diverticulosis, Colon  (ICD-562.10) 13)  Irritable Bowel Syndrome  (ICD-564.1) 14)  Colonic Polyps, Hx of  (ICD-V12.72) 15)  Fibromyalgia  (ICD-729.1) 16)  Headache  (ICD-784.0) 17)  Dizziness, Chronic  (ICD-780.4)  Current Medications (verified): 1)  Travatan 0.004 % Soln (Travoprost) .... Use One Drop in Each Eye  At Bedtime 2)  Alavert 10 Mg  Tabs (Loratadine) .... Take 1 Tablet By Mouth Once A Day As Needed 3)  Nasonex 50 Mcg/act  Susp (Mometasone Furoate) .[name].. 1-2 Puffs Two Times A Day 4)  Symbicort 80-4.5 Mcg/act  Aero (Budesonide-Formoterol Fumarate) .... 2 Inhalations Two Times A Day 5)  Aspirin 81 Mg Tbec (Aspirin) .... Take 1 Tablet By Mouth Once A Day 6)  Metoprolol Tartrate 50 Mg Tabs (Metoprolol Tartrate) .... Take One Tablet By Mouth Two Times A Day 7)  Losartan Potassium-Hctz 100-12.5 Mg Tabs (Losartan Potassium-Hctz) .... Take 1 Tablet By Mouth Once A Day 8)  Crestor 10 Mg Tabs (Rosuvastatin Calcium) .... Take 1 Tablet By Mouth Two Days Per Week. 9)  Fish Oil Maximum Strength 1200 Mg Caps (Omega-3 Fatty Acids) .... Take 2 Caps By Mouth Twice Daily. 10)  Omeprazole 40 Mg Cpdr (Omeprazole) .... Take 1 Cap By Mouth Once Daily- 30 Min Before The 1st Meal of The Day. 11)  Hyoscyamine Sulfate 0.125 Mg  Tabs (Hyoscyamine Sulfate) .[name].. 1-2 By Mouth Q 4 Hours As Needed Esophageal Spasms 12)  Calcium 600/vitamin D 600-400 Mg-Unit Tabs (Calcium Carbonate-Vitamin D) .... Take 1 Tablet By Mouth Once A Day 13)  Vitamin B-12 100 Mcg Tabs (Cyanocobalamin) .[name].. 1 Tab By Mouth Once  Daily 14)  Vitamin D (Ergocalciferol) 50000 Unit Caps (Ergocalciferol) .... Weekly 15)  Hydrocodone-Acetaminophen 5-500 Mg Tabs (Hydrocodone-Acetaminophen) .... Take 1/2 To 1 Tab By Mouth Every 6 H As Needed For Pain...  Allergies (verified): 1)  ! Penicillin 2)  ! Codeine 3)  ! Simvastatin (Simvastatin) 4)  ! Trilipix (Choline Fenofibrate) 5)  ! Lisinopril (Lisinopril)  Past History:  Past Medical History: Last updated: 12/19/2009 GLAUCOMA (ICD-365.9) ALLERGIC RHINITIS (ICD-477.9) ASTHMA (ICD-493.90) HYPERTENSION (ICD-401.9) CORONARY ARTERY DISEASE (ICD-414.00) HYPERLIPIDEMIA (ICD-272.4) HIATAL HERNIA (ICD-553.3) GERD (ICD-530.81) GASTRITIS (ICD-535.50) Hx of ABDOMINAL PAIN, LEFT LOWER QUADRANT  (ICD-789.04) DIVERTICULOSIS, COLON (ICD-562.10) IRRITABLE BOWEL SYNDROME (ICD-564.1) COLONIC POLYPS, HX OF (ICD-V12.72) FIBROMYALGIA (ICD-729.1) HEADACHE (ICD-784.0) DIZZINESS, CHRONIC (ICD-780.4)  Past Surgical History: Last updated: 12/19/2009 PTCA-Stent 2007  Family History: Last updated: 01/01/2008 Family History of Breast Cancer:Sister Family History of Uterine Cancer:Sister Thyroid Cancer: Brother Family History of Prostate Cancer:Father Family History of Diabetes: Sister, brother  Social History: Last updated: 12/17/2007 Patient has never smoked.  Alcohol Use - no Daily Caffeine Use-1 Illicit Drug Use - no  Review of Systems       Denies fever, malais, weight loss, blurry vision, decreased visual acuity, cough, sputum, SOB, hemoptysis, pleuritic pain, palpitaitons, heartburn, abdominal pain, melena, lower extremity edema, claudication, or rash.   Vital Signs:  Patient profile:   75 year old female Height:      59 inches Weight:      132 pounds BMI:     26.76 Pulse rate:   72 / minute Resp:     14 per minute BP sitting:   140 / 80  (left arm)  Vitals Entered By: Kem Parkinson (January 03, 2010 12:03 PM)  Physical Exam  General:  Affect appropriate Healthy:  appears stated age HEENT: normal Neck supple with no adenopathy JVP normal no bruits no thyromegaly Lungs clear with no wheezing and good diaphragmatic motion Heart:  S1/S2 no murmur,rub, gallop or click PMI normal Abdomen: benighn, BS positve, no tenderness, no AAA no bruit.  No HSM or HJR Distal pulses intact with no bruits No edema Neuro non-focal Skin warm and dry    Impression & Recommendations:  Problem # 1:  CHEST WALL PAIN, ACUTE (ICD-786.52) Atypical pain but known disease.  Continue ASA and BB  Lexiscan myovue  Given limitations on pain meds from GERD and gastritis will give her as needed Tramadol and F/U with primary Her updated medication list for this problem includes:     Aspirin 81 Mg Tbec (Aspirin) .[name]... Take 1 tablet by mouth once a day    Metoprolol Tartrate 50 Mg Tabs (Metoprolol tartrate) .[name]... Take one tablet by mouth two times a day  Problem # 2:  HYPERTENSION (ICD-401.9) Well contorlled Her updated medication list for this problem includes:    Aspirin 81 Mg Tbec (Aspirin) .[name]... Take 1 tablet by mouth once a day    Metoprolol Tartrate 50 Mg Tabs (Metoprolol tartrate) .[name]... Take one tablet by mouth two times a day    Losartan Potassium-hctz 100-12.5 Mg Tabs (Losartan potassium-hctz) .[name]... Take 1 tablet by mouth once a day  Problem # 3:  HYPERLIPIDEMIA (ICD-272.4) Continue statin labs per lipid clinci Her updated medication list for this problem includes:    Crestor 10 Mg Tabs (Rosuvastatin calcium) .[name]... Take 1 tablet by mouth two days per week.  Other Orders: Nuclear Stress Test (Nuc Stress Test)  Patient Instructions: 1)  Your physician recommends that you schedule a follow-up appointment in: 6 months  2)  Your physician has recommended you make the following change in your medication: START TRAMADOL 25MG  ONCE DAILY AS NEEDED FOR PAIN 3)  Your physician has requested that you have an LEXISCAN stress myoview.  For further information please visit [email or web address]  Please follow instruction sheet, as given. Prescriptions: TRAMADOL HCL 50 MG TABS (TRAMADOL HCL) 1 TABLET DAILY AS NEEDED FOR PAIN  #30 x 0   Entered by:   Danielle Rankin, CMA   Authorized by:   Colon Branch, MD, [facility]   Signed by:   Danielle Rankin, CMA on 01/03/2010   Method used:   Print then Give to Patient   RxID:   [id number] GRIS-PEG 250 MG TABS (GRISEOFULVIN ULTRAMICROSIZE) 1 tablet by mouth 1 daily as needed for pain  #30 x 0   Entered by:   Danielle Rankin, CMA   Authorized by:   Colon Branch, MD, [facility]   Signed by:   Danielle Rankin, CMA on 01/03/2010   Method used:   Print then Give to Patient   RxID:   [id number]

## 2010-03-27 NOTE — Progress Notes (Signed)
  Phone Note Other Incoming   Request: Send information Summary of Call: Request for records received from [employer]. Request forwarded to [employer].     

## 2010-03-27 NOTE — Assessment & Plan Note (Signed)
Summary: Cardiology Nuclear Study  Nuclear Med Background Indications for Stress Test: Evaluation for Ischemia  Indications Comments: 04/14/09 ED chest pain with HTN urgency,MI r/o  History: Angioplasty, Asthma, Heart Catheterization, Myocardial Perfusion Study, Stents  History Comments: '07 PTCA/Stent-LAD x 2; '08 Cath:Patent stents with 40% residual in LAD; 09/09 ZOX:WRUEAV, EF=89%  Symptoms: Chest Tightness, Dizziness, Nausea  Symptoms Comments: Last episode of CP:Now, 3/10.   Nuclear Pre-Procedure Cardiac Risk Factors: Hypertension, Lipids Caffeine/Decaff Intake: None NPO After: 8:00 PM Lungs: Clear IV 0.9% NS with Angio Cath: 22g     IV Site: (L) AC IV Started by: Irean Hong RN Chest Size (in) 36     Cup Size D     Height (in): 59 Weight (lb): 127 BMI: 25.74  Nuclear Med Study 1 or 2 day study:  1 day     Stress Test Type:  Stress Reading MD:  Olga Millers, MD     Referring MD:  Charlton Haws, MD Resting Radionuclide:  Technetium 49m Tetrofosmin     Resting Radionuclide Dose:  11.0 mCi  Stress Radionuclide:  Technetium 73m Tetrofosmin     Stress Radionuclide Dose:  33.0 mCi   Stress Protocol Exercise Time (min):  8:00 min     Max HR:  110 bpm     Predicted Max HR:  149 bpm  Max Systolic BP: 204 mm Hg     Percent Max HR:  73.83 %     METS: 10.1 Rate Pressure Product:  40981    Stress Test Technologist:  [employer]a [employer] CMA-N     Nuclear Technologist:  Domenic Polite CNMT  Rest Procedure  Myocardial perfusion imaging was performed at rest 45 minutes following the intravenous administration of Myoview Technetium 36m Tetrofosmin.  Stress Procedure  The patient exercised for eight minutes.  The patient stopped due to fatigue.  She had c/o chest tightness prior to exercise, 3/10, that did not increase in intensity until 2-minutes post exercise.  She then c/o chest pressure, 7/10, that radiated to her back.  There were no diagnostic ST-T wave changes, only nonspecific  changes.  She did have a mild hypertensive response to exercise, 204/98.  Myoview was injected at peak exercise and myocardial perfusion imaging was performed after a brief delay.  QPS Raw Data Images:  Acuisition technically good; normal left ventricular size. Stress Images:  There is normal uptake in all areas. Rest Images:  Normal homogeneous uptake in all areas of the myocardium. Subtraction (SDS):  No evidence of ischemia. Transient Ischemic Dilatation:  .92  (Normal <1.22)  Lung/Heart Ratio:  .31  (Normal <0.45)  Quantitative Gated Spect Images QGS EDV:  40 ml QGS ESV:  6 ml QGS EF:  86 % QGS cine images:  Normal wall motion.   Overall Impression  Exercise Capacity: Fair exercise capacity. BP Response: Normal blood pressure response. Clinical Symptoms: There is chest pain ECG Impression: Insignificant upsloping ST segment depression. Overall Impression: There is no sign of scar or ischemia.  Appended Document: Cardiology Nuclear Study normal nuclear study  Appended Document: Cardiology Nuclear Study pt aware of results

## 2010-03-27 NOTE — Letter (Signed)
Summary: [employer] Instructions  [facility]  [address] [place], [place] [address]   Phone: [phone]  Fax: [phone]       [name]    73-01-1936    MRN: [id number]        Procedure Day Dorna Bloom:  Wednesday  04/26/09     Arrival Time:  8:00am      Procedure Time:  9:00am     Location of Procedure:                    _ X_  [facility] (4th Floor)                       PREPARATION FOR COLONOSCOPY WITH MOVIPREP   Starting 5 days prior to your procedure  Friday 02/25  do not eat nuts, seeds, popcorn, corn, beans, peas,  salads, or any raw vegetables.  Do not take any fiber supplements (e.g. Metamucil, Citrucel, and Benefiber).  THE DAY BEFORE YOUR PROCEDURE         DATE:  03/01  DAY: Tuesday  1.  Drink clear liquids the entire day-NO SOLID FOOD  2.  Do not drink anything colored red or purple.  Avoid juices with pulp.  No orange juice.  3.  Drink at least 64 oz. (8 glasses) of fluid/clear liquids during the day to prevent dehydration and help the prep work efficiently.  CLEAR LIQUIDS INCLUDE: Water Jello Ice Popsicles Tea (sugar ok, no milk/cream) Powdered fruit flavored drinks Coffee (sugar ok, no milk/cream) Gatorade Juice: apple, white grape, white cranberry  Lemonade Clear bullion, consomm, broth Carbonated beverages (any kind) Strained chicken noodle soup Hard Candy                             4.  In the morning, mix first dose of MoviPrep solution:    Empty 1 Pouch A and 1 Pouch B into the disposable container    Add lukewarm drinking water to the top line of the container. Mix to dissolve    Refrigerate (mixed solution should be used within 24 hrs)  5.  Begin drinking the prep at 5:00 p.m. The MoviPrep container is divided by 4 marks.   Every 15 minutes drink the solution down to the next mark (approximately 8 oz) until the full liter is complete.   6.  Follow completed prep with 16 oz of clear liquid of your  choice (Nothing red or purple).  Continue to drink clear liquids until bedtime.  7.  Before going to bed, mix second dose of MoviPrep solution:    Empty 1 Pouch A and 1 Pouch B into the disposable container    Add lukewarm drinking water to the top line of the container. Mix to dissolve    Refrigerate  THE DAY OF YOUR PROCEDURE      DATE:  03/02  DAY:  Wednesday  Beginning at  4:00 a.m. (5 hours before procedure):         1. Every 15 minutes, drink the solution down to the next mark (approx 8 oz) until the full liter is complete.  2. Follow completed prep with 16 oz. of clear liquid of your choice.    3. You may drink clear liquids until 7:00am  (2 HOURS BEFORE PROCEDURE).   MEDICATION INSTRUCTIONS  Unless otherwise instructed, you should take regular prescription medications with a small sip of  water   as early as possible the morning of your procedure.            OTHER INSTRUCTIONS  You will need a responsible adult at least 73 years of age to accompany you and drive you home.   This person must remain in the waiting room during your procedure.  Wear loose fitting clothing that is easily removed.  Leave jewelry and other valuables at home.  However, you may wish to bring a book to read or  an iPod/MP3 player to listen to music as you wait for your procedure to start.  Remove all body piercing jewelry and leave at home.  Total time from sign-in until discharge is approximately 2-3 hours.  You should go home directly after your procedure and rest.  You can resume normal activities the  day after your procedure.  The day of your procedure you should not:   Drive   Make legal decisions   Operate machinery   Drink alcohol   Return to work  You will receive specific instructions about eating, activities and medications before you leave.    The above instructions have been reviewed and explained to me by  Wyona Almas RN  April 12, 2009 2:14  PM     I fully understand and can verbalize these instructions _____________________________ Date _________

## 2010-03-27 NOTE — Assessment & Plan Note (Signed)
Summary: 2 month rov.sl   Primary Provider:  Alroy Dust, MD    History of Present Illness: [name] is seen today for F/U of HTN CAD and elevated lipids.;  She had a DES in 2007 to the LAD.  She has 40% residulal stenosis in LAD on cath in 2008 with a patent stent.  She would like to come off Plavix due to cost and I think this is fine.  She is not having any SSCP.  She has a cough.  She has some reactive airway disease but her cough is more persistant. Her cough improved off ACE and she is tolerating ARB.  She has white coat HTN and her BP is much better when taken at home.   She saw a nutritionist  but has not been very good with her diet eating too much fried food. She is intolerant to statins, vytorin and niaspan.  Her LDL is suboptimal but the best we have been able to do is flaxseed/fish oil with diet control.    Current Problems (verified): 1)  Glaucoma  (ICD-365.9) 2)  Allergic Rhinitis  (ICD-477.9) 3)  Asthma  (ICD-493.90) 4)  Hypertension  (ICD-401.9) 5)  Coronary Artery Disease  (ICD-414.00) 6)  Hyperlipidemia  (ICD-272.4) 7)  Hiatal Hernia  (ICD-553.3) 8)  Gerd  (ICD-530.81) 9)  Gastritis  (ICD-535.50) 10)  Hx of Abdominal Pain, Left Lower Quadrant  (ICD-789.04) 11)  Diverticulosis, Colon  (ICD-562.10) 12)  Irritable Bowel Syndrome  (ICD-564.1) 13)  Colonic Polyps, Hx of  (ICD-V12.72) 14)  Fibromyalgia  (ICD-729.1) 15)  Headache  (ICD-784.0) 16)  Dizziness, Chronic  (ICD-780.4)  Current Medications (verified): 1)  Travatan 0.004 % Soln (Travoprost) .... Use One Drop in Each Eye At Bedtime 2)  Alavert 10 Mg  Tabs (Loratadine) .... Take 1 Tablet By Mouth Once A Day As Needed 3)  Nasonex 50 Mcg/act  Susp (Mometasone Furoate) .[name].. 1-2 Puffs Two Times A Day 4)  Symbicort 80-4.5 Mcg/act  Aero (Budesonide-Formoterol Fumarate) .... 2 Inhalations Two Times A Day 5)  Bufferin 325 Mg Tabs (Aspirin Buf(Cacarb-Mgcarb-Mgo)) .... Take 1 Tab By Mouth Once Daily. 6)  Metoprolol Tartrate  50 Mg Tabs (Metoprolol Tartrate) .... Take One Tablet By Mouth Two Times A Day 7)  Losartan Potassium-Hctz 100-12.5 Mg Tabs (Losartan Potassium-Hctz) .... Take One Tablet By Mouth Two Times A Day 8)  Fish Oil Maximum Strength 1200 Mg Caps (Omega-3 Fatty Acids) .... Take 1 Cap Three Times A Day. 9)  Flax Seed Oil 1000 Mg Caps (Flaxseed (Linseed)) .... Take As Directed By [facility] 10)  Omeprazole 40 Mg Cpdr (Omeprazole) .... Take 1 Cap By Mouth Once Daily- 30 Min Before The 1st Meal of The Day. 11)  Hyoscyamine Sulfate 0.125 Mg  Tabs (Hyoscyamine Sulfate) .... As Needed 12)  Promethazine Hcl 25 Mg  Tabs (Promethazine Hcl) .... As Needed 13)  Calcium 600/vitamin D 600-400 Mg-Unit Tabs (Calcium Carbonate-Vitamin D) .... Take 1 Tablet By Mouth Once A Day 14)  Vitamin B-12 100 Mcg Tabs (Cyanocobalamin) .[name].. 1 Tab By Mouth Once Daily 15)  Vitamin D (Ergocalciferol) 50000 Unit Caps (Ergocalciferol) .... Weekly 16)  Hydrocodone-Acetaminophen 5-500 Mg Tabs (Hydrocodone-Acetaminophen) .... Take 1/2 To 1 Tab By Mouth Every 6 H As Needed For Pain...  Allergies (verified): 1)  ! Penicillin 2)  ! Codeine 3)  ! Simvastatin (Simvastatin) 4)  ! Trilipix (Choline Fenofibrate) 5)  ! Lisinopril (Lisinopril)  Past History:  Past Medical History: Last updated: 05/04/2009  GLAUCOMA (ICD-365.9) ALLERGIC  RHINITIS (ICD-477.9) ASTHMA (ICD-493.90) HYPERTENSION (ICD-401.9) CORONARY ARTERY DISEASE (ICD-414.00) HYPERLIPIDEMIA (ICD-272.4) HIATAL HERNIA (ICD-553.3) GERD (ICD-530.81) GASTRITIS (ICD-535.50) Hx of ABDOMINAL PAIN, LEFT LOWER QUADRANT (ICD-789.04) DIVERTICULOSIS, COLON (ICD-562.10) IRRITABLE BOWEL SYNDROME (ICD-564.1) COLONIC POLYPS, HX OF (ICD-V12.72) FIBROMYALGIA (ICD-729.1) HEADACHE (ICD-784.0) DIZZINESS, CHRONIC (ICD-780.4)  Past Surgical History: Last updated: 05/04/2009 PTCA-Stent 2007  Family History: Last updated: 01/01/2008 Family History of Breast Cancer:Sister Family  History of Uterine Cancer:Sister Thyroid Cancer: Brother Family History of Prostate Cancer:Father Family History of Diabetes: Sister, brother  Social History: Last updated: 12/17/2007 Patient has never smoked.  Alcohol Use - no Daily Caffeine Use-1 Illicit Drug Use - no  Review of Systems       Denies fever, malais, weight loss, blurry vision, decreased visual acuity, cough, sputum, SOB, hemoptysis, pleuritic pain, palpitaitons, heartburn, abdominal pain, melena, lower extremity edema, claudication, or rash.   Vital Signs:  Patient profile:   75 year old female Height:      59 inches Weight:      133 pounds BMI:     26.96 Pulse rate:   60 / minute Resp:     14 per minute BP sitting:   150 / 60  Vitals Entered By: Kem Parkinson (Jun 27, 2009 9:03 AM)  Physical Exam  General:  Affect appropriate Healthy:  appears stated age HEENT: normal Neck supple with no adenopathy JVP normal no bruits no thyromegaly Lungs clear with no wheezing and good diaphragmatic motion Heart:  S1/S2 no murmur,rub, gallop or click PMI normal Abdomen: benighn, BS positve, no tenderness, no AAA no bruit.  No HSM or HJR Distal pulses intact with no bruits No edema Neuro non-focal Skin warm and dry    Impression & Recommendations:  Problem # 1:  HYPERTENSION (ICD-401.9) Likely well controlled by home readings.  Improve low sodium diet Her updated medication list for this problem includes:    Bufferin 325 Mg Tabs (Aspirin buf(cacarb-mgcarb-mgo)) .[name]... Take 1 tab by mouth once daily.    Metoprolol Tartrate 50 Mg Tabs (Metoprolol tartrate) .[name]... Take one tablet by mouth two times a day    Losartan Potassium-hctz 100-12.5 Mg Tabs (Losartan potassium-hctz) .[name]... Take one tablet by mouth two times a day  Problem # 2:  CORONARY ARTERY DISEASE (ICD-414.00) Stable no angina.  Non-ischemic myovue 3/11 Her updated medication list for this problem includes:    Bufferin 325 Mg Tabs (Aspirin  buf(cacarb-mgcarb-mgo)) .[name]... Take 1 tab by mouth once daily.    Metoprolol Tartrate 50 Mg Tabs (Metoprolol tartrate) .[name]... Take one tablet by mouth two times a day  Problem # 3:  HYPERLIPIDEMIA (ICD-272.4) Continue to cut back on fried food and take "oils" CHOL: 284 (04/11/2009)   LDL: DEL (05/03/2008)   HDL: 78.60 (04/11/2009)   TG: 156.0 (04/11/2009)  Patient Instructions: 1)  Your physician recommends that you schedule a follow-up appointment in: 6 MONTHS  Appended Document: 2 month rov.sl LFT's ok continue crestor

## 2010-03-27 NOTE — Assessment & Plan Note (Signed)
Summary: ESOPHAGUS IS HURTING/YF   History of Present Illness Visit Type: Follow-up Visit Primary GI MD: Sheryn Bison MD FACP FAGA Primary Provider: Alroy Dust, MD  Chief Complaint: Intermittant chest pain with nausea throughout the day. Pt states since starting back on omeprazole, her symptoms have improved but she still has some acid reflux problems.  History of Present Illness:   75 year old Caucasian female with chronic acid reflux her dental chronic PPI therapy but recently administered dosages and had recurrent substernal chest pain which is now very much been relieved with restarting omeprazole 40 mg a day. Her last endoscopy was unremarkable several years ago. She denies any pulmonary or cardiovascular symptomatology, and has had cardiac evaluation by Dr. [name] been unremarkable. She had a headache with nitroglycerin use, and is currently using p.r.n. sublingual Levsin. She denies dysphagia or any specific hepatobiliary complaints. I cannot find a gallbladder ultrasound reported her record. Her mother had cholecystectomy. She Does not take prednisone and and has not been on recent antibiotics. She is followed closely by cardiology and has had previous angioplasty and stent placement. She is not on Coumadin or Plavix but does take aspirin.   GI Review of Systems    Reports abdominal pain and  chest pain.      Denies acid reflux, belching, bloating, dysphagia with liquids, dysphagia with solids, heartburn, loss of appetite, nausea, vomiting, vomiting blood, weight loss, and  weight gain.        Denies anal fissure, black tarry stools, change in bowel habit, constipation, diarrhea, diverticulosis, fecal incontinence, heme positive stool, hemorrhoids, irritable bowel syndrome, jaundice, light color stool, liver problems, rectal bleeding, and  rectal pain.    Current Medications (verified): 1)  Travatan 0.004 % Soln (Travoprost) .... Use One Drop in Each Eye At Bedtime 2)  Alavert 10 Mg   Tabs (Loratadine) .... Take 1 Tablet By Mouth Once A Day As Needed 3)  Nasonex 50 Mcg/act  Susp (Mometasone Furoate) .[name].. 1-2 Puffs Two Times A Day 4)  Symbicort 80-4.5 Mcg/act  Aero (Budesonide-Formoterol Fumarate) .... 2 Inhalations Two Times A Day 5)  Aspirin 81 Mg Tbec (Aspirin) .... Take 1 Tablet By Mouth Once A Day 6)  Metoprolol Tartrate 50 Mg Tabs (Metoprolol Tartrate) .... Take One Tablet By Mouth Two Times A Day 7)  Losartan Potassium-Hctz 100-12.5 Mg Tabs (Losartan Potassium-Hctz) .... Take 1 Tablet By Mouth Once A Day 8)  Crestor 10 Mg Tabs (Rosuvastatin Calcium) .... Take 1 Tablet By Mouth Two Days Per Week. 9)  Fish Oil Maximum Strength 1200 Mg Caps (Omega-3 Fatty Acids) .... Take 2 Caps By Mouth Twice Daily. 10)  Omeprazole 40 Mg Cpdr (Omeprazole) .... Take 1 Cap By Mouth Once Daily- 30 Min Before The 1st Meal of The Day. 11)  Hyoscyamine Sulfate 0.125 Mg  Tabs (Hyoscyamine Sulfate) .[name].. 1-2 By Mouth Q 4 Hours As Needed Esophageal Spasms 12)  Calcium 600/vitamin D 600-400 Mg-Unit Tabs (Calcium Carbonate-Vitamin D) .... Take 1 Tablet By Mouth Once A Day 13)  Vitamin B-12 100 Mcg Tabs (Cyanocobalamin) .[name].. 1 Tab By Mouth Once Daily 14)  Vitamin D (Ergocalciferol) 50000 Unit Caps (Ergocalciferol) .... Weekly 15)  Hydrocodone-Acetaminophen 5-500 Mg Tabs (Hydrocodone-Acetaminophen) .... Take 1/2 To 1 Tab By Mouth Every 6 H As Needed For Pain...  Allergies (verified): 1)  ! Penicillin 2)  ! Codeine 3)  ! Simvastatin (Simvastatin) 4)  ! Trilipix (Choline Fenofibrate) 5)  ! Lisinopril (Lisinopril)  Past History:  Past medical, surgical, family and social  histories (including risk factors) reviewed for relevance to current acute and chronic problems.  Past Medical History: Reviewed history from 12/19/2009 and no changes required. GLAUCOMA (ICD-365.9) ALLERGIC RHINITIS (ICD-477.9) ASTHMA (ICD-493.90) HYPERTENSION (ICD-401.9) CORONARY ARTERY DISEASE (ICD-414.00) HYPERLIPIDEMIA  (ICD-272.4) HIATAL HERNIA (ICD-553.3) GERD (ICD-530.81) GASTRITIS (ICD-535.50) Hx of ABDOMINAL PAIN, LEFT LOWER QUADRANT (ICD-789.04) DIVERTICULOSIS, COLON (ICD-562.10) IRRITABLE BOWEL SYNDROME (ICD-564.1) COLONIC POLYPS, HX OF (ICD-V12.72) FIBROMYALGIA (ICD-729.1) HEADACHE (ICD-784.0) DIZZINESS, CHRONIC (ICD-780.4)  Past Surgical History: Reviewed history from 12/19/2009 and no changes required. PTCA-Stent 2007  Family History: Reviewed history from 01/01/2008 and no changes required. Family History of Breast Cancer:Sister Family History of Uterine Cancer:Sister Thyroid Cancer: Brother Family History of Prostate Cancer:Father Family History of Diabetes: Sister, brother  Social History: Reviewed history from 12/17/2007 and no changes required. Patient has never smoked.  Alcohol Use - no Daily Caffeine Use-1 Illicit Drug Use - no  Review of Systems  The patient denies allergy/sinus, anemia, anxiety-new, arthritis/joint pain, back pain, blood in urine, breast changes/lumps, change in vision, confusion, cough, coughing up blood, depression-new, fainting, fatigue, fever, headaches-new, hearing problems, heart murmur, heart rhythm changes, itching, menstrual pain, muscle pains/cramps, night sweats, nosebleeds, pregnancy symptoms, shortness of breath, skin rash, sleeping problems, sore throat, swelling of feet/legs, swollen lymph glands, thirst - excessive , urination - excessive , urination changes/pain, urine leakage, vision changes, and voice change.    Vital Signs:  Patient profile:   75 year old female Height:      59 inches Weight:      133.38 pounds BMI:     27.04 Pulse rate:   74 / minute Pulse rhythm:   regular BP sitting:   142 / 68  (left arm) Cuff size:   regular  Vitals Entered By: Christie Nottingham CMA Duncan Dull) (January 02, 2010 11:25 AM)  Physical Exam  General:  Well developed, well nourished, no acute distress.healthy appearing.   Head:  Normocephalic and  atraumatic. Eyes:  PERRLA, no icterus.exam deferred to patient's ophthalmologist.   Mouth:  No deformity or lesions, dentition normal. Neck:  Supple; no masses or thyromegaly. Lungs:  Clear throughout to auscultation. Heart:  Regular rate and rhythm; no murmurs, rubs,  or bruits. Abdomen:  Soft, nontender and nondistended. No masses, hepatosplenomegaly or hernias noted. Normal bowel sounds. Extremities:  No clubbing, cyanosis, edema or deformities noted. Neurologic:  Alert and  oriented x4;  grossly normal neurologically. Cervical Nodes:  No significant cervical adenopathy. Psych:  Alert and cooperative. Normal mood and affect.   Impression & Recommendations:  Problem # 1:  CHEST WALL PAIN, ACUTE (ICD-786.52) Assessment Improved Probable relapse of acid reflux-related esophageal spasm. She is to continue standard antireflux maneuvers and omeprazole 40 mg a day. Gallbladder ultrasound exam has been ordered to exclude cholelithiasis. General clinical course she may need repeat esophageal manometry and 24-hour pH probe testing. Orders: TLB-B12, Serum-Total ONLY ([address]-U04) TLB-Ferritin (82728-FER) TLB-Folic Acid (Folate) (82746-FOL) TLB-IBC Pnl (Iron/FE;Transferrin) (83550-IBC) TLB-Magnesium (Mg) (83735-MG)  Problem # 2:  CORONARY ARTERY DISEASE (ICD-414.00) Assessment: Unchanged Continue cardiac followup is scheduled and other medications as listed and reviewed in her record.  Other Orders: Ultrasound Abdomen (UAS)  Patient Instructions: 1)  Copy sent to : Alroy Dust, MD and Dr. Valera Castle 2)  Please continue current medications.  3)  Your prescription(s) have been sent to you pharmacy.  4)  Please go to the basement today for your labs.  5)  Your abdominal ultrasound is scheduled for 01/09/2010, please follow the seperate instructions.  6)  The medication list was reviewed and reconciled.  All changed / newly prescribed medications were explained.  A complete medication list  was provided to the patient / caregiver. 7)  Avoid foods high in acid content ( tomatoes, citrus juices, spicy foods) . Avoid eating within 3 to 4 hours of lying down or before exercising. Do not over eat; try smaller more frequent meals. Elevate head of bed four inches when sleeping.  Prescriptions: OMEPRAZOLE 40 MG CPDR (OMEPRAZOLE) take 1 cap by mouth once daily- 30 min before the 1st meal of the day.  #30 x 6   Entered by:   Harlow Mares CMA (AAMA)   Authorized by:   Mardella Layman MD [facility]   Signed by:   Mardella Layman MD [facility] on 01/02/2010   Method used:   Electronically to        [facility]  [address] # [id number]* (retail)       [address]       [place], [place]  [address]       Ph: [phone] or [phone]       Fax: [phone]   RxID:   [id number]

## 2010-03-27 NOTE — Miscellaneous (Signed)
Summary: [facility] Previsit/prep  Clinical Lists Changes  Medications: Added new medication of MOVIPREP 100 GM  SOLR (PEG-KCL-NACL-NASULF-NA ASC-C) As per prep instructions. - Signed Rx of MOVIPREP 100 GM  SOLR (PEG-KCL-NACL-NASULF-NA ASC-C) As per prep instructions.;  #1 x 0;  Signed;  Entered by: Wyona Almas RN;  Authorized by: Mardella Layman MD [facility];  Method used: Electronically to [facility] [address] # [id number]*, [address] [place], [place], [place]  [address], Ph: [phone] or [phone], Fax: [phone] Observations: Added new observation of ALLERGY REV: Done (04/12/2009 13:14)    Prescriptions: MOVIPREP 100 GM  SOLR (PEG-KCL-NACL-NASULF-NA ASC-C) As per prep instructions.  #1 x 0   Entered by:   Wyona Almas RN   Authorized by:   Mardella Layman MD [facility]   Signed by:   Wyona Almas RN on 04/12/2009   Method used:   Electronically to        [facility] [address] # [id number]* (retail)       [address]       [place], [place]  [address]       Ph: [phone] or [phone]       Fax: [phone]   RxID:   [id number]

## 2010-03-27 NOTE — Progress Notes (Signed)
Summary: pt has rapid heart rate   Phone Note Call from Patient Call back at Home Phone [phone]   Caller: Patient Reason for Call: Talk to Nurse, Talk to Doctor Summary of Call: pt is having a rapid heart beat since her b/p meds have been changed Initial call taken by: Omer Jack,  April 19, 2009 4:23 PM  Follow-up for Phone Call        spoke with pt, she took the first dose of cozaar last night and she has noticed today that her heart rate has been elevated all day. she has not taken her metoprolol at all yesterday or today because she thought the lorsartan was to take its place. explained to pt that the losartan was to be taken with all of her current meds. she will take her metoprolol now and see if that calms her heart rate down. she will call if cont to have problems Deliah Goody, RN  April 19, 2009 4:41 PM  Follow-up by: Deliah Goody, RN,  April 19, 2009 4:36 PM

## 2010-03-27 NOTE — Assessment & Plan Note (Signed)
Summary: rov/cb   Visit Type:  Follow-up Referring Provider:  Eden Emms Primary Provider:  Alroy Dust, MD   CC:  dyslipidemia follow-up.  History of Present Illness:       Lipid Clinic Visit      The patient comes in today for dyslipidemia follow-up.  The patient has no history of medication problems, chest pain, muscle aches, and muscle cramps.  She is currently taking Crestor 10mg  once  a week and fish oil 4 gm daily.  She has not been taking CoQ10 recently because she ran out but has not noticed any additional pain.  She has had some leg pain but attributes to issues with her foot rather than medications.   Diet:  Cut out all fried food.  Increased tomato sandwiches and vegetables out of the garden.  Cooks vegetables in olive oil.  Decreased sweet tea and coffee since last visit.    Exercise:  Prior to this visit, she has been able to stay active and walk on a regular basis.  Over the past month or so, she has not be able to exercise due to foot issues (heal spur).  Going to chiropractor since July and hopes to be able to walk for exercise within the next month. The humidity has also kept her in some too.    Lipid Management Provider  Weston Brass, PharmD  Current Medications (verified): 1)  Travatan 0.004 % Soln (Travoprost) .... Use One Drop in Each Eye At Bedtime 2)  Alavert 10 Mg  Tabs (Loratadine) .... Take 1 Tablet By Mouth Once A Day As Needed 3)  Nasonex 50 Mcg/act  Susp (Mometasone Furoate) .[name].. 1-2 Puffs Two Times A Day 4)  Symbicort 80-4.5 Mcg/act  Aero (Budesonide-Formoterol Fumarate) .... 2 Inhalations Two Times A Day 5)  Proair Hfa 108 (90 Base) Mcg/act Aers (Albuterol Sulfate) .[name].. 1-2 Sp Every 4-6 H As Needed For Wheezing... 6)  Aspirin 81 Mg Tbec (Aspirin) .... Take 1 Tablet By Mouth Once A Day 7)  Metoprolol Tartrate 50 Mg Tabs (Metoprolol Tartrate) .... Take One Tablet By Mouth Two Times A Day 8)  Losartan Potassium-Hctz 100-12.5 Mg Tabs (Losartan Potassium-Hctz)  .... Take 1 Tablet By Mouth Once A Day 9)  Fish Oil Maximum Strength 1200 Mg Caps (Omega-3 Fatty Acids) .... Take 2 Caps By Mouth Twice Daily. 10)  Omeprazole 40 Mg Cpdr (Omeprazole) .... Take 1 Cap By Mouth Once Daily- 30 Min Before The 1st Meal of The Day. 11)  Hyoscyamine Sulfate 0.125 Mg  Tabs (Hyoscyamine Sulfate) .... As Needed 12)  Calcium 600/vitamin D 600-400 Mg-Unit Tabs (Calcium Carbonate-Vitamin D) .... Take 1 Tablet By Mouth Once A Day 13)  Vitamin B-12 100 Mcg Tabs (Cyanocobalamin) .[name].. 1 Tab By Mouth Once Daily 14)  Vitamin D (Ergocalciferol) 50000 Unit Caps (Ergocalciferol) .... Weekly 15)  Crestor 10 Mg Tabs (Rosuvastatin Calcium) .... Take 1 Tablet By Mouth Two Days Per Week.  Allergies (verified): 1)  ! Penicillin 2)  ! Codeine 3)  ! Simvastatin (Simvastatin) 4)  ! Trilipix (Choline Fenofibrate) 5)  ! Lisinopril (Lisinopril)  Past History:  Risk Factors: Alcohol Use: 0 (08/03/2009) Caffeine Use: 1 cup coffee / day (04/13/2009) Exercise: yes (08/03/2009)  Risk Factors: Smoking Status: never (08/03/2009)   Vital Signs:  Patient profile:   75 year old female Height:      59 inches Weight:      135 pounds BMI:     27.37 BP sitting:   147 / 75 Cuff size:  regular  Impression & Recommendations:  Problem # 1:  HYPERLIPIDEMIA (ICD-272.4) Pt's current cholesterol: TC- 222 (goal<200), TG- 270 (goal<150), HDL- 49 (goal>45) and LDL- 137 (goal<100).  LFTs are WNL.  Since last visit, her LDL has improved greatly from 193 to 137.  She is not having any issues tolerating Crestor at one dose per week.  Her TG have increased and HDL decreased.  This is likely due to lack of exercise due to bone spurs.  Pt is hopeful she will be walking again within the next month.  Will try to increase Crestor to 10mg  two days of the week and continue fish oil.  Encouraged pt to continue her low-cholesterol diet and try to walk as much as her foot will allow.  Will f/u with pt in 2 months.     Her updated medication list for this problem includes:    Crestor 10 Mg Tabs (Rosuvastatin calcium) .[name]... Take 1 tablet by mouth two days per week.  Patient Instructions: 1)  Increase Crestor to 10mg  two days a week.   2)  Continue fish oil 2 grams twice a day 3)  If you have muscle pains, start taking CoQ10 once daily 4)  Continue low-cholesterol diet 5)  Try to start walking as much as possible.   6)  Recheck labs: October 10 at [facility] office 7)  Lipid Clinic Appt: December 07, 2009 at 2pm

## 2010-03-27 NOTE — Letter (Signed)
Summary: Patient Notice- Polyp Results  [facility]  [address] [place], [place] [address]   Phone: [phone]  Fax: [phone]        April 28, 2009 MRN: [id number]    [facility] [address] [place], [place]  [address]    Dear Ms. Darnold,  I am pleased to inform you that the colon polyp(s) removed during your recent colonoscopy was (were) found to be benign (no cancer detected) upon pathologic examination.  I recommend you have a repeat colonoscopy examination in 5_ years to look for recurrent polyps, as having colon polyps increases your risk for having recurrent polyps or even colon cancer in the future.  Should you develop new or worsening symptoms of abdominal pain, bowel habit changes or bleeding from the rectum or bowels, please schedule an evaluation with either your primary care physician or with me.  Additional information/recommendations:  _x_ No further action with gastroenterology is needed at this time. Please      follow-up with your primary care physician for your other healthcare      needs.  __ Please call [phone] to schedule a return visit to review your      situation.  __ Please keep your follow-up visit as already scheduled.  __ Continue treatment plan as outlined the day of your exam.  Please call us if you are having persistent problems or have questions about your condition that have not been fully answered at this time.  Sincerely,  Mardella Layman MD [facility]  This letter has been electronically signed by your physician.  Appended Document: Patient Notice- Polyp Results Letter mailed 3.8.11

## 2010-03-27 NOTE — Assessment & Plan Note (Signed)
Summary: rov/sp   Referring Provider:  Eden Emms Primary Provider:  Alroy Dust, MD    History of Present Illness:        Lipid Clinic Visit      The patient comes in today for dyslipidemia follow-up.  The patient has no history of medication problems, chest pain,or  muscle cramps.  She is currently taking Crestor 10mg  twice a week and fish oil 4 gm daily.  She has some minor aches with Crestor, but she states she is able to tolerate this with no issues.  She is taking CoQ10 on a daily basis and says this helps.   Diet:      Since last visit, pt has cut out all red meat.  She eats baked chicken most of the time and fish approximately once a week.  Her vegetables usually include peas, green beans, potatoes, or greens.  She is trying to decrease sweets as much as possible.  She has noticed many of her favorite desserts are too sweet now so she just eats a small serving.  She normally drinks water, tea (hot and iced) and coffee with 1% milk.   Exercise:      She developed a heal spur earlier this summer.  Went to chiropractor and pain improved.  She tried to walk on it for about 10 minutes at work, but it began to hurt again.  She now has a "fluid pocket" and has been using ice to keep the pain down.   Lipid Management Provider  Weston Brass, PharmD  Current Medications (verified): 1)  Travatan 0.004 % Soln (Travoprost) .... Use One Drop in Each Eye At Bedtime 2)  Alavert 10 Mg  Tabs (Loratadine) .... Take 1 Tablet By Mouth Once A Day As Needed 3)  Nasonex 50 Mcg/act  Susp (Mometasone Furoate) .[name].. 1-2 Puffs Two Times A Day 4)  Symbicort 80-4.5 Mcg/act  Aero (Budesonide-Formoterol Fumarate) .... 2 Inhalations Two Times A Day 5)  Proair Hfa 108 (90 Base) Mcg/act Aers (Albuterol Sulfate) .[name].. 1-2 Sp Every 4-6 H As Needed For Wheezing... 6)  Aspirin 81 Mg Tbec (Aspirin) .... Take 1 Tablet By Mouth Once A Day 7)  Metoprolol Tartrate 50 Mg Tabs (Metoprolol Tartrate) .... Take One Tablet By Mouth  Two Times A Day 8)  Losartan Potassium-Hctz 100-12.5 Mg Tabs (Losartan Potassium-Hctz) .... Take 1 Tablet By Mouth Once A Day 9)  Fish Oil Maximum Strength 1200 Mg Caps (Omega-3 Fatty Acids) .... Take 2 Caps By Mouth Twice Daily. 10)  Omeprazole 40 Mg Cpdr (Omeprazole) .... Take 1 Cap By Mouth Once Daily- 30 Min Before The 1st Meal of The Day. 11)  Hyoscyamine Sulfate 0.125 Mg  Tabs (Hyoscyamine Sulfate) .... As Needed 12)  Calcium 600/vitamin D 600-400 Mg-Unit Tabs (Calcium Carbonate-Vitamin D) .... Take 1 Tablet By Mouth Once A Day 13)  Vitamin B-12 100 Mcg Tabs (Cyanocobalamin) .[name].. 1 Tab By Mouth Once Daily 14)  Vitamin D (Ergocalciferol) 50000 Unit Caps (Ergocalciferol) .... Weekly 15)  Crestor 10 Mg Tabs (Rosuvastatin Calcium) .... Take 1 Tablet By Mouth Two Days Per Week.  Allergies (verified): 1)  ! Penicillin 2)  ! Codeine 3)  ! Simvastatin (Simvastatin) 4)  ! Trilipix (Choline Fenofibrate) 5)  ! Lisinopril (Lisinopril)   Vital Signs:  Patient profile:   75 year old female Height:      59 inches Weight:      135.50 pounds BMI:     27.47 Pulse rate:   70 / minute  BP sitting:   140 / 70  Impression & Recommendations:  Problem # 1:  HYPERLIPIDEMIA (ICD-272.4) Assessment Improved Pt's current cholesterol: TC- 198 (goal<200), TG- 215 (goal<150), HDL- 53 (goal>45), and LDL 109 (goal <100).  AST and ALT are WNL.  Pt's cholesterol has improved since increaseing Crestor to 2 days/week but TG remain well above goal.  Given pt's long history of intolerances and that is having some pains with Crestor at 2 days/week, will not increase medications at this time.  Encouraged her to decrease sweets and increase high-fiber vegetables rather than starchy ones.  Exercise will be limited due to her heal spur.  Will f/u in 4 months.   Her updated medication list for this problem includes:    Crestor 10 Mg Tabs (Rosuvastatin calcium) .[name]... Take 1 tablet by mouth two days per week.  Patient  Instructions: 1)  Your cholesterol is much improved!  2)  Continue same medications 3)  Try to limit your sweets and increase the amount of high-fiber vegetables in your diet. 4)  Continue to let your ankle heal. When you are feeling better, start walking as much as you can tolerate 5)  We will recheck your cholesterol in 4 months.  6)  Lab Appt: 04/17/2010 at the [facility] office 7)  Lipid Clinic Appt: 2/23 at 1:30 pm

## 2010-03-27 NOTE — Progress Notes (Signed)
Summary: Esophagus is hurting  Phone Note Call from Patient Call back at Work Phone [phone] Call back at until noon   Call For: Dr Jarold Motto Reason for Call: Talk to Nurse Summary of Call: Esophagus is hurting. Scheduled office visit for next available 01-02-10 wonders what she can do in the meantime? Initial call taken by: Leanor Kail [facility],  December 13, 2009 9:49 AM  Follow-up for Phone Call        patient is having esophageal spasms, she has stopped her omeprazole.  I have advised her to resume her omeprazole.  She is requesting a refill on her hyoscyamine.  I will send this in to her pharmacy.  She is scheduled to see Dr Jarold Motto 01/02/10 11:30.  I will add her to the cancelation list. Follow-up by: Darcey Nora RN, CGRN,  December 13, 2009 10:29 AM    New/Updated Medications: HYOSCYAMINE SULFATE 0.125 MG  TABS (HYOSCYAMINE SULFATE) 1-2 by mouth q 4 hours as needed esophageal spasms Prescriptions: HYOSCYAMINE SULFATE 0.125 MG  TABS (HYOSCYAMINE SULFATE) 1-2 by mouth q 4 hours as needed esophageal spasms  #100 x 1   Entered by:   Darcey Nora RN, CGRN   Authorized by:   Mardella Layman MD [facility]   Signed by:   Darcey Nora RN, CGRN on 12/13/2009   Method used:   Electronically to        [facility]  [address] # [id number]* (retail)       [address]       [place], [place]  [address]       Ph: [phone] or [phone]       Fax: [phone]   RxID:   [id number]

## 2010-03-27 NOTE — Assessment & Plan Note (Signed)
Summary: Cardiology Nuclear Testing  Nuclear Med Background Indications for Stress Test: Evaluation for Ischemia, Stent Patency, PTCA Patency  Indications Comments: .  History: Angioplasty, Asthma, Heart Catheterization, Myocardial Perfusion Study, Stents  History Comments: '07 PTCA/Stent-LAD x 2. '08 Cath:Patent stents with 40% residual in LAD.  05/10/09 OVF:IEPPIR, EF=86%.  Symptoms: Chest Pain  Symptoms Comments: CP radiates to back and shoulder. Cough, and muscle aches in back of neck.   Nuclear Pre-Procedure Cardiac Risk Factors: Hypertension, Lipids Caffeine/Decaff Intake: none NPO After: 7:00 PM Lungs: clear IV 0.9% NS with Angio Cath: 22g     IV Site: R Hand IV Started by: Cathlyn Parsons, RN Chest Size (in) 36     Cup Size D     Height (in): 59 Weight (lb): 132 BMI: 26.76 Tech Comments: Metoprolol held x 13hrs.  Pictures checked and they were positive. The pictures and test data were taken to P. Nishan, the patient's cardiologist. She was seen by P.Nishan and was set up for a cardiac cath for 01/17/10.  Nuclear Med Study 1 or 2 day study:  1 day     Stress Test Type:  Eugenie Birks Reading MD:  Marca Ancona, MD     Referring MD:  Charlton Haws, MD Resting Radionuclide:  Technetium 65m Tetrofosmin     Resting Radionuclide Dose:  10.6 mCi  Stress Radionuclide:  Technetium 36m Tetrofosmin     Stress Radionuclide Dose:  33 mCi   Stress Protocol  Max Systolic BP: 199 mm Hg Lexiscan: 0.4 mg   Stress Test Technologist:  Milana Na, EMT-P     Nuclear Technologist:  Doyne Keel, CNMT  Rest Procedure  Myocardial perfusion imaging was performed at rest 45 minutes following the intravenous administration of Technetium 67m Tetrofosmin.  Stress Procedure  The patient received IV Lexiscan 0.4 mg over 15-seconds.  Technetium 4m Tetrofosmin injected at 30-seconds.  There were non specific changes and a rare pvc with infusion.  Quantitative spect images were obtained after  a 45 minute delay.  QPS Raw Data Images:  Normal; no motion artifact; normal heart/lung ratio. Stress Images:  Moderate mid to apical anteroseptal and true apex perfusion defect.  Rest Images:  Normal homogeneous uptake in all areas of the myocardium. Subtraction (SDS):  Reversible moderate mid to apical anteroseptal and true apex perfusion defect.  Transient Ischemic Dilatation:  1.18  (Normal <1.22)  Lung/Heart Ratio:  0.29  (Normal <0.45)  Quantitative Gated Spect Images QGS EDV:  51 ml QGS ESV:  11 ml QGS EF:  79 % QGS cine images:  Normal wall motion.    Overall Impression  Exercise Capacity: Lexiscan with no exercise. BP Response: Hypertensive blood pressure response. Clinical Symptoms: Chest pressure ECG Impression: 1 mm ST depression inferior leads, 1/2 mm ST depression V5-V6 Overall Impression: Moderate-sized reversible mid to apical anteroseptal and true apex perfusion defect, suggesting ischemia in the LAD territory.

## 2010-03-27 NOTE — Cardiovascular Report (Signed)
Summary: CDC Pre-Cath Orders  CDC Pre-Cath Orders   Imported By: Earl Many 01/24/2010 16:18:56  _____________________________________________________________________  External Attachment:    Type:   Image     Comment:   External Document

## 2010-03-27 NOTE — Assessment & Plan Note (Signed)
Summary: rov lipid- - [facility]   Visit Type:  Follow-up Referring Provider:  Eden Emms Primary Provider:  Alroy Dust, MD    History of Present Illness: [name] is a pleasant 75 yo female presenting to [facility]id Clinic today for FU.  She states she is tolerating Fish Oil 1200 mg three times a day and Flaxseed Oil 1 capsule three times a day, however most days she only takes twice daily.  Pt lists multiple intolerances, including Niacin, Zetia, Statins, and Fenofibrate.    She has started eating broiled instead of fried fish.  She eats several servings of fruits and vegetables per day, but this does not approach the recommended 5 servings.  For breakfast, she eats Cheerios with 1% milk, raisin bread with PB & J, and/or a boiled egg.  For lunch, she eats a ham or [place] sandwich with lettuce/tomato on wheat or rye bread or a salad.  For dinner, she eats vegetables, potatoes (usually mashed or stewed), and baked chicken/pork chops or red meat (2-3x per week).  Snacks include jello with fruit, apples, almonds, ice cream, or cobblers.  She drinks mostly water with a mix of sweet/unsweet tea with an occasional Pepsi.  Patient continues to walk 30 minutes 4-5 times per week.  This is increasing now that the weather is warmer.  She is also active around her house and with gardening.  [name] is doing well.  She has been compliant with her fish oil capsules.  She has stopped taking Niacin because of continued "prickling" and flushing reactions unrelieved by the usual measures.   Zetia - myalgias Crestor - myalgias simvastatin - mylagias fenofibrate - burning in thighs   Lipid Management Provider  Leota Sauers, PharmD, BCPS, CPP  Preventive Screening-Counseling & Management  Alcohol-Tobacco     Alcohol drinks/day: 0     Smoking Status: never  Caffeine-Diet-Exercise     Does Patient Exercise: yes     Type of exercise: Walking     Exercise (avg: min/session): 30-60     Times/week: 4  Current  Medications (verified): 1)  Travatan 0.004 % Soln (Travoprost) .... Use One Drop in Each Eye At Bedtime 2)  Alavert 10 Mg  Tabs (Loratadine) .... Take 1 Tablet By Mouth Once A Day As Needed 3)  Nasonex 50 Mcg/act  Susp (Mometasone Furoate) .[name].. 1-2 Puffs Two Times A Day 4)  Symbicort 80-4.5 Mcg/act  Aero (Budesonide-Formoterol Fumarate) .... 2 Inhalations Two Times A Day 5)  Proair Hfa 108 (90 Base) Mcg/act Aers (Albuterol Sulfate) .[name].. 1-2 Sp Every 4-6 H As Needed For Wheezing... 6)  Aspirin 81 Mg Tbec (Aspirin) .... Take 1 Tablet By Mouth Once A Day 7)  Metoprolol Tartrate 50 Mg Tabs (Metoprolol Tartrate) .... Take One Tablet By Mouth Two Times A Day 8)  Losartan Potassium-Hctz 100-12.5 Mg Tabs (Losartan Potassium-Hctz) .... Take 1 Tablet By Mouth Once A Day 9)  Fish Oil Maximum Strength 1200 Mg Caps (Omega-3 Fatty Acids) .... Take 2 Caps By Mouth Twice Daily. 10)  Omeprazole 40 Mg Cpdr (Omeprazole) .... Take 1 Cap By Mouth Once Daily- 30 Min Before The 1st Meal of The Day. 11)  Hyoscyamine Sulfate 0.125 Mg  Tabs (Hyoscyamine Sulfate) .... As Needed 12)  Promethazine Hcl 25 Mg  Tabs (Promethazine Hcl) .... As Needed 13)  Calcium 600/vitamin D 600-400 Mg-Unit Tabs (Calcium Carbonate-Vitamin D) .... Take 1 Tablet By Mouth Once A Day 14)  Vitamin B-12 100 Mcg Tabs (Cyanocobalamin) .[name].. 1 Tab By Mouth Once  Daily 15)  Vitamin D (Ergocalciferol) 50000 Unit Caps (Ergocalciferol) .... Weekly 16)  Hydrocodone-Acetaminophen 5-500 Mg Tabs (Hydrocodone-Acetaminophen) .... Take 1/2 To 1 Tab By Mouth Every 6 H As Needed For Pain... 17)  Crestor 10 Mg Tabs (Rosuvastatin Calcium) .... Take 1 Tablet By Mouth One Day Per Week. 18)  Coenzyme Q10 100 Mg Caps (Coenzyme Q10) .... Take 1 Capsule By Mouth Daily.  Allergies: 1)  ! Penicillin 2)  ! Codeine 3)  ! Simvastatin (Simvastatin) 4)  ! Trilipix (Choline Fenofibrate) 5)  ! Lisinopril (Lisinopril)   Impression & Recommendations:  Problem # 1:   HYPERLIPIDEMIA (ICD-272.4) Assessment Deteriorated NCEP/ATP III goals for this pt are TC<200, LDL<70, HDL>40, TG<150. Current lipid panel is:  TC 277, LDL 193.3, HDL 55.4, TG 241.  Dyslipidemia has worsened since last visit in February. HDL remains at goal, however it has decreased. LFTs WNL. Pt will not reach goals with lifestyle modifications and current meds. Discussed initiating Welchol or Crestor weekly, and pt agreed to Crestor weekly after discussing risks/benefits of therapy vs. no treatment. Start Crestor 10 mg once weekly with Coenzyme Q10 daily to prevent myalgias. Stop Flaxseed Oil, which is providing minimal benefit, and increase Fish Oil to 2 caps two times a day. Dietary modifications include changing to skim milk, unsweet tea, and red meats 1x weekly. Continue exercise 5x weekly. FU Lipid Clinic 10/12/09 with fasting lipid/liver 10/03/09. Call w/ concerns. Her updated medication list for this problem includes:    Crestor 10 Mg Tabs (Rosuvastatin calcium) .[name]... Take 1 tablet by mouth one day per week.    Fish Oil 1200 mg .[name]... Take 2 capsules by mouth twice daily.    Coenzyme Q10 .[name]... Take 1 capsule by mouth daily.  Patient Instructions: 1)  Stop Flaxseed Oil.   2)  Increase Fish Oil to 2 capsules by mouth twice daily. 3)  Start Crestor 10 mg by mouth one day per week. 4)  Start Co-enzyme Q10 1 tablet/capsule by mouth daily. 5)  Start drinking skim milk and unsweet tea, limit red meat to once weekly, and do not add salt to foods. 6)  Continue walking 30 minutes 5 times per week.  This is great! 7)  Follow-up at Lipid Clinic in 2 months on 10/12/09 at 1:30.  Fasting labs the week before on 8/9/11at 8:30.

## 2010-03-27 NOTE — Assessment & Plan Note (Signed)
Summary: back pain//lmr   Primary Care Provider:  Alroy Dust, MD   CC:  5 month ROV & add-on for chest & back pain....  History of Present Illness: 75 y/o WF here for a follow up visit... she has multiple medical problems as noted below...    ~  May 04, 2009:  presents w/ HA- posterior pain> band-like c/w muscle contraction HA... she under some stress dealing w/ father's estate... she went to the ER w/ CP & elevated BP> she ruled out for MI & was started on HYZAAR 100-12.5 by DrNishan in f/u... she has a repeat Myoview pending next week (it was neg)... she continues her Lipid Management by Walker Kehr & the [facility] on [place], FlaxSeedOil, & diet...  she had f/u colonoscopy3/11 by DrPatterson> divertics, 1 polyp= tubular adenoma, f/u 63yrs...    ~  Jul 25, 2009:  she's noted some incr SOB, wheezing over the last 2 weeks w/ the heat... we discussed using the Symbicort, Proair, etc... she saw Walker Kehr 5/11- doing well, stopped her Plavix, no angina & non-ischemic Myoview 3/11... BP controlled on meds;  continues on FishOil & FlaxSeesOil for her Chol (intol to all other meds) followed by the clinic but FLP remains deranged w/ TChol 277 & LDL 193;  we will give her a TDAP today...   ~  December 19, 2009:  presents for add-on visit due to chest discomfort that started after picking greens recommended by the [facility]... she got scared & took NTG thinking it might be esoph spasm- no relief from this or Levsin called in by [employer]... she notes chest wall tender & pain incr w/ activity/ movement; and some relief after heat & Vicodin... exam shows tender chest wall, clear lungs, reg rhythm & we discussed rest, heat, analgesics... BP remains controlled on meds;  Chol improved on Cres10 twice per week via lipid clinic f/u;  GI appears stable overall;  hx FM/ HAs/ dizzy, etc... OK Flu shot.   Current Problem List:  GLAUCOMA (ICD-365.9) - she is sched for right cataract surg by DrBevis in  Feb...  ALLERGIC RHINITIS (ICD-477.9) - she uses Alavert & Nasonex Prn...  ASTHMA (ICD-493.90) - on SYMBICORT80 and improved, but only using it Prn...  ~  5/11:  notes incr chest symptoms in the heat & rec to take the Symbicort 2sp Bid...  HYPERTENSION (ICD-401.9) - controlled on TOPROL 50mg Bid & HYZAAR 100-12.5 daily (off Lisinopril due to ACE cough)... BP 140/80 today and similiar prev checks... tol meds well... denies HA, fatigue, visual changes, CP, palipit, syncope, dyspnea, edema, etc...  CORONARY ARTERY DISEASE (ICD-414.00) - on ASA 81mg /d (intol to 325 she says) & off Plavix per Walker Kehr...  Cardiolite 9/08 showed CP & HBP response, EF+85%... had cath 10/08 w/ single vessel dis w/ mod ostial LAD stenosis and patent LAD stent, normal LVF.[name]. option for med Rx vs off pump LIMA... she notes some CP/ pressure "burning" when walking up a certain hill during her daily walks, but states this is stable and "no worse"...  ~  Myoview 9/09 was neg- no scar or ischemia, EF= 89%, hypertensive response- Lisinopril10 added.  ~  f/u Myoview 3/11 was neg- no scar or ischemia, EF= 86%, norm BP response, fair exerc capacity.  ~  saw Walker Kehr 5/11- no CP, he stopped her Plavix, stable- continue other meds same.  HYPERLIPIDEMIA (ICD-272.4) - on CRESTOR 10mg  2 days per week, FISH OIL 1200mg  Tid + Flax Seed Oil... she has been intol to statins in the  past>  tried low dose Trilipix but stopped this due to "thigh burning"...  now followed in the [facility]id Clinic---  ~  FLP 7/08 showed TChol 217, TG 236, HDL 46, LDL 150...   ~  FLP 6/09 showed TChol 233, TG 263, HDL 46, LDL 150... she agrees to try LOW DOSE Trilipix.  ~  FLP 11/09 showed TChol 162, TG 167, HDL 45, LDL 83...  ~  FLP 11/10 showed TChol 240, TG 271, HDL 45, LDL 150... LipClin Rx w/ FishOil & FlaxSeedOil.  ~  FLP 2/11 showed TChol 285, TG 156, HDL 79, LDL 180... LC is aware & working w/ her regularly.  ~  FLP 5/11 showed TChol 277, TG 241, HDL55, LDL  193...  LC added Cres10 just 2d per week.  ~  FLP 10/11 showed TChol 198, TG 215, HDL 53, LDL 109... much improved on Cres10 twice weekly.  GERD (ICD-530.81) - on OMEPRAZOLE 40mg /d... last EGD by DrPatterson 8/07 revealed sm HH, & gastritis and dilatation done... (RUT=neg, PPI Rx)... LEVSIN 0.125mg  helps her esoph spasm... Prn Phenergan for nausea.  DIVERTICULOSIS, COLON (ICD-562.10) & IRRITABLE BOWEL SYNDROME (ICD-564.1)  ~  colonoscopy 2/03 by DrPatterson showed divertics and 5 mm polyp...  ~  colonoscopy 3/11 showed divertics & cecal polyp= tubular adenoma, w/ f/u planned 89yrs.  FIBROMYALGIA (ICD-729.1) - she c/o chr fatigue, aching/ sore, prev on Tramadol & Skelaxin but most benefit from low dose Hydrocodone  ~1/2 tab Prn... I have recommended an increase exercise program to her...  ~  5/11:  Vit D level = 50 on 50000 u weekly by Rock Nephew, NP  HEADACHE (ICD-784.0) - eval at [facility] in [facility], but she notes Rx didn't help...  ~  3/11:  presented w/ muscle contraction HA's> Rx Vicodin 1/2 to 1 Q6H Prn.  DIZZINESS, CHRONIC (ICD-780.4) - MRI 2/09 per Walker Kehr showed atrophy, sm vessel dis, NAD...  Health Maintenance:  ~  GI:  DrPatterson & up to date on colon screening.  ~  GYN:  yearly f/u w/ Rock Nephew, NP for PAP, Mammogram at [facility], ?last BMD.  ~  Immunizations:  yearly Flu vaccines in fall, Pneumovax in 2007?, TDAP given 5/11...   Preventive Screening-Counseling & Management  Alcohol-Tobacco     Alcohol drinks/day: 0     Smoking Status: never  Caffeine-Diet-Exercise     Caffeine use/day: 1 cup coffee / day     Does Patient Exercise: yes     Type of exercise: Walking     Exercise (avg: min/session): 30-60     Times/week: 4  Allergies: 1)  ! Penicillin 2)  ! Codeine 3)  ! Simvastatin (Simvastatin) 4)  ! Trilipix (Choline Fenofibrate) 5)  ! Lisinopril (Lisinopril)  Comments:  Nurse/Medical Assistant: The patient's medications and allergies were  reviewed with the patient and were updated in the Medication and Allergy Lists.  Past History:  Past Medical History: GLAUCOMA (ICD-365.9) ALLERGIC RHINITIS (ICD-477.9) ASTHMA (ICD-493.90) HYPERTENSION (ICD-401.9) CORONARY ARTERY DISEASE (ICD-414.00) HYPERLIPIDEMIA (ICD-272.4) HIATAL HERNIA (ICD-553.3) GERD (ICD-530.81) GASTRITIS (ICD-535.50) Hx of ABDOMINAL PAIN, LEFT LOWER QUADRANT (ICD-789.04) DIVERTICULOSIS, COLON (ICD-562.10) IRRITABLE BOWEL SYNDROME (ICD-564.1) COLONIC POLYPS, HX OF (ICD-V12.72) FIBROMYALGIA (ICD-729.1) HEADACHE (ICD-784.0) DIZZINESS, CHRONIC (ICD-780.4)  Past Surgical History: PTCA-Stent 2007  Family History: Reviewed history from 01/01/2008 and no changes required. Family History of Breast Cancer:Sister Family History of Uterine Cancer:Sister Thyroid Cancer: Brother Family History of Prostate Cancer:Father Family History of Diabetes: Sister, brother  Social History: Reviewed history from 12/17/2007 and no changes required.  Patient has never smoked.  Alcohol Use - no Daily Caffeine Use-1 Illicit Drug Use - no  Review of Systems      See HPI       The patient complains of chest pain and headaches.  The patient denies anorexia, fever, weight loss, weight gain, vision loss, decreased hearing, hoarseness, syncope, dyspnea on exertion, peripheral edema, prolonged cough, hemoptysis, abdominal pain, melena, hematochezia, severe indigestion/heartburn, hematuria, incontinence, muscle weakness, suspicious skin lesions, transient blindness, difficulty walking, depression, unusual weight change, abnormal bleeding, enlarged lymph nodes, and angioedema.    Vital Signs:  Patient profile:   75 year old female Height:      59 inches Weight:      134.13 pounds BMI:     27.19 O2 Sat:      95 % on Room air Temp:     97.8 degrees F oral Pulse rate:   67 / minute BP sitting:   140 / 80  (left arm) Cuff size:   regular  Vitals Entered By: Randell Loop CMA  (December 19, 2009 3:26 PM)  O2 Sat at Rest %:  95 O2 Flow:  Room air CC: 5 month ROV & add-on for chest & back pain... Is Patient Diabetic? No Pain Assessment Patient in pain? yes      Comments meds updated today with pt   Physical Exam  Additional Exam:  WD, WN, 75 y/o WF in NAD... GENERAL:  Alert & oriented; pleasant & cooperative... HEENT:  [place]/AT, EOM-wnl, PERRLA, EACs-clear, TMs-wnl, NOSE-clear, THROAT-clear & wnl. NECK:  Supple w/ fair ROM; no JVD; normal carotid impulses w/o bruits; no thyromegaly or nodules palpated; no lymphadenopathy. CHEST:  Clear to P & A; without wheezes/ rales/ or rhonchi heard... chest wall is tender & reproduces her pain... HEART:  Regular Rhythm; without murmurs/ rubs/ or gallops detected... ABDOMEN:  Soft & nontender; normal bowel sounds; no organomegaly or masses palpated... EXT: without deformities, mild arthritic changes and +trigger points, no varicose veins/ venous insuffic/ or edema. NEURO:  CN's intact; motor testing normal; sensory testing normal; gait normal & balance OK. DERM:  No lesions noted; no rash etc...    EKG  Procedure date:  12/19/2009  Findings:      Normal sinus rhythm with rate of:  64/ min... Tracing shows NSSTTWA, NAD... SN   Impression & Recommendations:  Problem # 1:  CHEST WALL PAIN, ACUTE (ICD-786.52) Hx & exam c/w CWP & we discussed Rx w/ rest, heat, Vicodin... Her updated medication list for this problem includes:    Aspirin 81 Mg Tbec (Aspirin) .[name]... Take 1 tablet by mouth once a day  Orders: 12 Lead EKG (12 Lead EKG)  Problem # 2:  ASTHMA (ICD-493.90) Stable>  doing well on Symbicort & hasn't needed rescue inhaler... Her updated medication list for this problem includes:    Symbicort 80-4.5 Mcg/act Aero (Budesonide-formoterol fumarate) .[name]... 2 inhalations two times a day  Problem # 3:  HYPERTENSION (ICD-401.9) Controlled>  same meds. Her updated medication list for this problem includes:     Metoprolol Tartrate 50 Mg Tabs (Metoprolol tartrate) .[name]... Take one tablet by mouth two times a day    Losartan Potassium-hctz 100-12.5 Mg Tabs (Losartan potassium-hctz) .[name]... Take 1 tablet by mouth once a day  Problem # 4:  CORONARY ARTERY DISEASE (ICD-414.00) Stable>  no angina, & continue same meds. Her updated medication list for this problem includes:    Aspirin 81 Mg Tbec (Aspirin) .[name]... Take 1 tablet by mouth once  a day    Metoprolol Tartrate 50 Mg Tabs (Metoprolol tartrate) .[name]... Take one tablet by mouth two times a day    Losartan Potassium-hctz 100-12.5 Mg Tabs (Losartan potassium-hctz) .[name]... Take 1 tablet by mouth once a day  Problem # 5:  HYPERLIPIDEMIA (ICD-272.4) Followed by Lipid Clinic & on Cres10 twice/ wk... Her updated medication list for this problem includes:    Crestor 10 Mg Tabs (Rosuvastatin calcium) .[name]... Take 1 tablet by mouth two days per week.  Problem # 6:  GERD (ICD-530.81) GI per DrPatterson & stable on PPI Rx + Levsin Prn... Her updated medication list for this problem includes:    Omeprazole 40 Mg Cpdr (Omeprazole) .[name]... Take 1 cap by mouth once daily- 30 min before the 1st meal of the day.    Hyoscyamine Sulfate 0.125 Mg Tabs (Hyoscyamine sulfate) .[name]... 1-2 by mouth q 4 hours as needed esophageal spasms  Problem # 7:  OTHER MEDICAL PROBLEMS AS NOTED>>> OK Flu shot today...  Complete Medication List: 1)  Travatan 0.004 % Soln (Travoprost) .... Use one drop in each eye at bedtime 2)  Alavert 10 Mg Tabs (Loratadine) .... Take 1 tablet by mouth once a day as needed 3)  Nasonex 50 Mcg/act Susp (Mometasone furoate) .[name].. 1-2 puffs two times a day 4)  Symbicort 80-4.5 Mcg/act Aero (Budesonide-formoterol fumarate) .... 2 inhalations two times a day 5)  Aspirin 81 Mg Tbec (Aspirin) .... Take 1 tablet by mouth once a day 6)  Metoprolol Tartrate 50 Mg Tabs (Metoprolol tartrate) .... Take one tablet by mouth two times a day 7)  Losartan Potassium-hctz 100-12.5 Mg  Tabs (Losartan potassium-hctz) .... Take 1 tablet by mouth once a day 8)  Crestor 10 Mg Tabs (Rosuvastatin calcium) .... Take 1 tablet by mouth two days per week. 9)  Fish Oil Maximum Strength 1200 Mg Caps (Omega-3 fatty acids) .... Take 2 caps by mouth twice daily. 10)  Omeprazole 40 Mg Cpdr (Omeprazole) .... Take 1 cap by mouth once daily- 30 min before the 1st meal of the day. 11)  Hyoscyamine Sulfate 0.125 Mg Tabs (Hyoscyamine sulfate) .[name].. 1-2 by mouth q 4 hours as needed esophageal spasms 12)  Calcium 600/vitamin D 600-400 Mg-unit Tabs (Calcium carbonate-vitamin d) .... Take 1 tablet by mouth once a day 13)  Vitamin B-12 100 Mcg Tabs (Cyanocobalamin) .[name].. 1 tab by mouth once daily 14)  Vitamin D (ergocalciferol) 50000 Unit Caps (Ergocalciferol) .... Weekly 15)  Hydrocodone-acetaminophen 5-500 Mg Tabs (Hydrocodone-acetaminophen) .... Take 1/2 to 1 tab by mouth every 6 h as needed for pain...  Patient Instructions: 1)  Today we updated your med list- see below.... 2)  For your chest discomfort:  REST the chest, apply HEAT as we discussed, and take the Hydrocodone as needed... 3)  For your Acid reflux:  continue the acid suppression w/ PRILOSEC or similar med daily (check your insurance company drug formulary).Marland KitchenMarland Kitchen 4)  Call for any questions.Marland KitchenMarland Kitchen  5)  Cancel the prev scheduled 11/29 OV & resched it for Jan-Feb...   Immunization History:  Influenza Immunization History:    Influenza:  historical (12/08/2008)   Appended Document: flu vaccine given at ov    Clinical Lists Changes  Orders: Added new Service order of Flu Vaccine 1yrs + MEDICARE PATIENTS ([id number]) - Signed Added new Service order of Administration Flu vaccine - MCR (P2951) - Signed Observations: Added new observation of FLU VAX VIS: 09/19/2009 version (12/20/2009 8:30) Added new observation of FLU VAXLOT: [id number] (12/20/2009 8:30) Added  new observation of FLU VAXMFR: Glaxosmithkline (12/20/2009 8:30) Added new  observation of FLU VAX EXP: 08/25/2010 (12/20/2009 8:30) Added new observation of FLU VAX DSE: 0.61ml (12/20/2009 8:30) Added new observation of FLU VAX: Fluvax 3+ (12/20/2009 8:30)Flu Vaccine Consent Questions     Do you have a history of severe allergic reactions to this vaccine? no    Any prior history of allergic reactions to egg and/or gelatin? no    Do you have a sensitivity to the preservative Thimersol? no    Do you have a past history of Guillan-Barre Syndrome? no    Do you currently have an acute febrile illness? no    Have you ever had a severe reaction to latex? no    Vaccine information given and explained to patient? yes    Are you currently pregnant? no    Lot Number:[id number]   Exp Date:08/25/2010   Site Given  Left Deltoid IMf FLU VAX: Fluvax 3+ (12/20/2009 8:30)  Randell Loop CMA  December 20, 2009 8:31 AM     .Jacquelyne Balint

## 2010-03-27 NOTE — Assessment & Plan Note (Signed)
Summary: rov/abnormal nuc/dm   Referring Provider:  Eden Emms Primary Provider:  Alroy Dust, MD    History of Present Illness: [name] is seen today for F/U of HTN CAD and elevated lipids.;  She had a DES in 2007 to the LAD.  She has 40% residulal stenosis in LAD on cath in 2008 with a patent stent.  Plavix was stopped in May  She is not having any SSCP.  She has a cough.  She has some reactive airway disease but her cough is more persistant. Her cough improved off ACE and she is tolerating ARB.  She has white coat HTN and her BP is much better when taken at home.   She saw a nutritionist  but has not been very good with her diet eating too much fried food. She is intolerant to statins, vytorin and niaspan.  Her LDL is suboptimal but the best we have been able to do is flaxseed/fish oil with diet control.    She complained of SSCP last visit  She had her F/U myovue today and it showed significant anteroapical ischemia.  Discussed cath with her.  Introduced her to Dr Alice Reichert who will cath her tomorrow.  Risks including stroke discussed and she is willing to proceed  Current Problems (verified): 1)  Fatty Liver Disease  (ICD-571.8) 2)  Cholelithiasis  (ICD-574.20) 3)  Chest Wall Pain, Acute  (ICD-786.52) 4)  Glaucoma  (ICD-365.9) 5)  Allergic Rhinitis  (ICD-477.9) 6)  Asthma  (ICD-493.90) 7)  Hypertension  (ICD-401.9) 8)  Coronary Artery Disease  (ICD-414.00) 9)  Hyperlipidemia  (ICD-272.4) 10)  Hiatal Hernia  (ICD-553.3) 11)  Gerd  (ICD-530.81) 12)  Gastritis  (ICD-535.50) 13)  Hx of Abdominal Pain, Left Lower Quadrant  (ICD-789.04) 14)  Diverticulosis, Colon  (ICD-562.10) 15)  Irritable Bowel Syndrome  (ICD-564.1) 16)  Colonic Polyps, Hx of  (ICD-V12.72) 17)  Fibromyalgia  (ICD-729.1) 18)  Headache  (ICD-784.0) 19)  Dizziness, Chronic  (ICD-780.4)  Current Medications (verified): 1)  Travatan 0.004 % Soln (Travoprost) .... Use One Drop in Each Eye At Bedtime 2)  Alavert 10 Mg   Tabs (Loratadine) .... Take 1 Tablet By Mouth Once A Day As Needed 3)  Nasonex 50 Mcg/act  Susp (Mometasone Furoate) .[name].. 1-2 Puffs Two Times A Day 4)  Symbicort 80-4.5 Mcg/act  Aero (Budesonide-Formoterol Fumarate) .... 2 Inhalations Two Times A Day 5)  Aspirin 81 Mg Tbec (Aspirin) .... Take 1 Tablet By Mouth Once A Day 6)  Metoprolol Tartrate 50 Mg Tabs (Metoprolol Tartrate) .... Take One Tablet By Mouth Two Times A Day 7)  Losartan Potassium-Hctz 100-12.5 Mg Tabs (Losartan Potassium-Hctz) .... Take 1 Tablet By Mouth Once A Day 8)  Crestor 10 Mg Tabs (Rosuvastatin Calcium) .... Take 1 Tablet By Mouth Two Days Per Week. 9)  Fish Oil Maximum Strength 1200 Mg Caps (Omega-3 Fatty Acids) .... Take 2 Caps By Mouth Twice Daily. 10)  Omeprazole 40 Mg Cpdr (Omeprazole) .... Take 1 Cap By Mouth Once Daily- 30 Min Before The 1st Meal of The Day. 11)  Hyoscyamine Sulfate 0.125 Mg  Tabs (Hyoscyamine Sulfate) .[name].. 1-2 By Mouth Q 4 Hours As Needed Esophageal Spasms 12)  Calcium 600/vitamin D 600-400 Mg-Unit Tabs (Calcium Carbonate-Vitamin D) .... Take 1 Tablet By Mouth Once A Day 13)  Vitamin B-12 100 Mcg Tabs (Cyanocobalamin) .[name].. 1 Tab By Mouth Once Daily 14)  Vitamin D (Ergocalciferol) 50000 Unit Caps (Ergocalciferol) .... Weekly 15)  Tramadol Hcl 50 Mg Tabs (Tramadol Hcl) .Marland KitchenMarland KitchenMarland Kitchen  1 Tablet Daily As Needed For Pain  Allergies (verified): 1)  ! Penicillin 2)  ! Codeine 3)  ! Simvastatin (Simvastatin) 4)  ! Trilipix (Choline Fenofibrate) 5)  ! Lisinopril (Lisinopril)  Past History:  Past Medical History: Last updated: 12/19/2009 GLAUCOMA (ICD-365.9) ALLERGIC RHINITIS (ICD-477.9) ASTHMA (ICD-493.90) HYPERTENSION (ICD-401.9) CORONARY ARTERY DISEASE (ICD-414.00) HYPERLIPIDEMIA (ICD-272.4) HIATAL HERNIA (ICD-553.3) GERD (ICD-530.81) GASTRITIS (ICD-535.50) Hx of ABDOMINAL PAIN, LEFT LOWER QUADRANT (ICD-789.04) DIVERTICULOSIS, COLON (ICD-562.10) IRRITABLE BOWEL SYNDROME (ICD-564.1) COLONIC POLYPS, HX  OF (ICD-V12.72) FIBROMYALGIA (ICD-729.1) HEADACHE (ICD-784.0) DIZZINESS, CHRONIC (ICD-780.4)  Past Surgical History: Last updated: 12/19/2009 PTCA-Stent 2007  Family History: Last updated: 01/01/2008 Family History of Breast Cancer:Sister Family History of Uterine Cancer:Sister Thyroid Cancer: Brother Family History of Prostate Cancer:Father Family History of Diabetes: Sister, brother  Social History: Last updated: 12/17/2007 Patient has never smoked.  Alcohol Use - no Daily Caffeine Use-1 Illicit Drug Use - no  Review of Systems       Denies fever, malais, weight loss, blurry vision, decreased visual acuity, cough, sputum, SOB, hemoptysis, pleuritic pain, palpitaitons, heartburn, abdominal pain, melena, lower extremity edema, claudication, or rash.   Vital Signs:  Patient profile:   75 year old female Weight:      132 pounds Pulse rate:   85 / minute BP sitting:   150 / 80  (left arm) Cuff size:   regular  Vitals Entered By: Deliah Goody, RN (January 16, 2010 11:42 AM)  Physical Exam  General:  Affect appropriate Healthy:  appears stated age HEENT: normal Neck supple with no adenopathy JVP normal no bruits no thyromegaly Lungs clear with no wheezing and good diaphragmatic motion Heart:  S1/S2 no murmur,rub, gallop or click PMI normal Abdomen: benighn, BS positve, no tenderness, no AAA no bruit.  No HSM or HJR Distal pulses intact with no bruits No edema Neuro non-focal Skin warm and dry    Impression & Recommendations:  Problem # 1:  CORONARY ARTERY DISEASE (ICD-414.00) Chest pain myovue with anteroapical ischemia with old LAD stent  Cath in a.m.  Her updated medication list for this problem includes:    Aspirin 81 Mg Tbec (Aspirin) .[name]... Take 1 tablet by mouth once a day    Metoprolol Tartrate 50 Mg Tabs (Metoprolol tartrate) .[name]... Take one tablet by mouth two times a day  Orders: TLB-CBC Platelet - w/Differential (85025-CBCD) TLB-PT (Protime)  (85610-PTP)  Problem # 2:  HYPERTENSION (ICD-401.9) Stable will continue to montor  Low sodium diet Her updated medication list for this problem includes:    Aspirin 81 Mg Tbec (Aspirin) .[name]... Take 1 tablet by mouth once a day    Metoprolol Tartrate 50 Mg Tabs (Metoprolol tartrate) .[name]... Take one tablet by mouth two times a day    Losartan Potassium-hctz 100-12.5 Mg Tabs (Losartan potassium-hctz) .[name]... Take 1 tablet by mouth once a day  Orders: TLB-BMP (Basic Metabolic Panel-BMET) (80048-METABOL)  Problem # 3:  HYPERLIPIDEMIA (ICD-272.4) Assessment: Unchanged At goal continue crestor Her updated medication list for this problem includes:    Crestor 10 Mg Tabs (Rosuvastatin calcium) .[name]... Take 1 tablet by mouth two days per week.  CHOL: 198 (12/04/2009)   LDL: DEL (05/03/2008)   HDL: 52.60 (12/04/2009)   TG: 215.0 (12/04/2009)  Other Orders: T-2 View CXR (71020TC) Cardiac Catheterization (Cardiac Cath)  Patient Instructions: 1)  Your physician recommends that you schedule a follow-up appointment in:  2)  Your physician has requested that you have a cardiac catheterization.  Cardiac catheterization is used to diagnose  and/or treat various heart conditions. Doctors may recommend this procedure for a number of different reasons. The most common reason is to evaluate chest pain. Chest pain can be a symptom of coronary artery disease (CAD), and cardiac catheterization can show whether plaque is narrowing or blocking your heart's arteries. This procedure is also used to evaluate the valves, as well as measure the blood flow and oxygen levels in different parts of your heart.  For further information please visit [email or web address]  Please follow instruction sheet, as given.

## 2010-03-27 NOTE — Letter (Signed)
Summary: Previsit letter  [facility]  [address] [place], [place] [address]   Phone: [phone]  Fax: [phone]       03/23/2009 MRN: [id number]  [name] [address] [place], [place]  [address]  Dear Ms. Tumlin,  Welcome to the Gastroenterology Division at [facility].    You are scheduled to see a nurse for your pre-procedure visit on 04-12-09 at 1:30p.m. on the 3rd floor at [facility], [address]  We ask that you try to arrive at our office 15 minutes prior to your appointment time to allow for check-in.  Your nurse visit will consist of discussing your medical and surgical history, your immediate family medical history, and your medications.    Please bring a complete list of all your medications or, if you prefer, bring the medication bottles and we will list them.  We will need to be aware of both prescribed and over the counter drugs.  We will need to know exact dosage information as well.  If you are on blood thinners (Coumadin, Plavix, Aggrenox, Ticlid, etc.) please call our office today/prior to your appointment, as we need to consult with your physician about holding your medication.   Please be prepared to read and sign documents such as consent forms, a financial agreement, and acknowledgement forms.  If necessary, and with your consent, a friend or relative is welcome to sit-in on the nurse visit with you.  Please bring your insurance card so that we may make a copy of it.  If your insurance requires a referral to see a specialist, please bring your referral form from your primary care physician.  No co-pay is required for this nurse visit.     If you cannot keep your appointment, please call [phone] to cancel or reschedule prior to your appointment date.  This allows [place] the opportunity to schedule an appointment for another patient in need of care.    Thank you for choosing [facility] for your medical  needs.  We appreciate the opportunity to care for you.  Please visit [place] at our website  to learn more about our practice.                     Sincerely.                                                                                                                   The Gastroenterology Division

## 2010-03-27 NOTE — Letter (Signed)
Summary: Custom - Lipid  [facility], Main Office  [address]   [place], [place] [address]   Phone: [phone]  Fax: [phone]     December 05, 2009 MRN: [id number]   [facility] [address] [place], [place]  [address]   Dear Ms. Silos,  We have reviewed your cholesterol results.  They are as follows:     Total Cholesterol:    198 (Desirable: less than 200)       HDL  Cholesterol:     52.60  (Desirable: greater than 40 for men and 50 for women)       LDL Cholesterol:       109.0  (Desirable: less than 100 for low risk and less than 70 for moderate to high risk)       Triglycerides:       215.0  (Desirable: less than 150)  Our recommendations include:These numbers look good. Continue on the same medicine. Liver function is normal. Take care, Dr. Leanora Cover.    Call our office at the number listed above if you have any questions.  Lowering your LDL cholesterol is important, but it is only one of a large number of "risk factors" that may indicate that you are at risk for heart disease, stroke or other complications of hardening of the arteries.  Other risk factors include:   A.  Cigarette Smoking* B.  High Blood Pressure* C.  Obesity* D.   Low HDL Cholesterol (see yours above)* E.   Diabetes Mellitus (higher risk if your is uncontrolled) F.  Family history of premature heart disease G.  Previous history of stroke or cardiovascular disease    *These are risk factors YOU HAVE CONTROL OVER.  For more information, visit .  There is now evidence that lowering the TOTAL CHOLESTEROL AND LDL CHOLESTEROL can reduce the risk of heart disease.  The American Heart Association recommends the following guidelines for the treatment of elevated cholesterol:  1.  If there is now current heart disease and less than two risk factors, TOTAL CHOLESTEROL should be less than 200 and LDL CHOLESTEROL should be less than 100. 2.  If there is current heart disease or  two or more risk factors, TOTAL CHOLESTEROL should be less than 200 and LDL CHOLESTEROL should be less than 70.  A diet low in cholesterol, saturated fat, and calories is the cornerstone of treatment for elevated cholesterol.  Cessation of smoking and exercise are also important in the management of elevated cholesterol and preventing vascular disease.  Studies have shown that 30 to 60 minutes of physical activity most days can help lower blood pressure, lower cholesterol, and keep your weight at a healthy level.  Drug therapy is used when cholesterol levels do not respond to therapeutic lifestyle changes (smoking cessation, diet, and exercise) and remains unacceptably high.  If medication is started, it is important to have you levels checked periodically to evaluate the need for further treatment options.  Thank you,    [employer] Team

## 2010-03-27 NOTE — Assessment & Plan Note (Signed)
Summary: headaches/mg   Primary Care Provider:  Alroy Dust, MD   CC:  4 month ROV & review of mult medical problems....  History of Present Illness: 75 y/o WF here for a follow up visit... she has multiple medical problems as noted below...    ~  March 09, 2008:  since I saw her last in Jul09- she has had f/u w/ Walker Kehr for Cards: labile HBP, & CAD- s/p PTCA & stent;  she had a f/u Myoview- neg w/o ischemia & EF= 89% w/ hypertensive response to exercise;  hx mult drug intolerances eg- statins, & Trilipix (stopped it due to "thigh burning")... he added Lisinopril 10mg /d...  Also had f/u DrPatterson:  episode diverticulitis Rx w/ Cipro & Flagyl;  CT Abd showed long segment of inflamm, otherw neg;  she is sched for a f/u colonoscopy soon>>> wasn't done.   ~  January 24, 2009:  she has maintained f/u w/ Walker Kehr for Cards and the [facility]id Clinic... several med changes since last OV- off Lisinopril due to ACE cough (better off med), stopped Plavix per Walker Kehr, stopped Zetia & Niaspan due to reactions per pt & lipid clinic... now taking FISH OIL & Flax Seed Oil... Labs reviewed w/ pt... she's had her 2010 flu shot.   ~  May 04, 2009:  presents w/ HA- posterior pain> band-like c/w muscle contraction HA... she under some stress dealing w/ father's estate... she went to the ER w/ CP & elevated BP> she ruled out for MI & was started on HYZAAR 100-12.5 by DrNishan in f/u... she has a repeat Myoview pending next week... she continues her Lipid Management by Walker Kehr & the [facility]id Clinic on [place], FlaxSeedOil, & diet...  she had f/u colonoscopy3/11 by DrPatterson> divertics, 1 polyp= tubular adenoma, f/u 25yrs...     Current Problem List:  GLAUCOMA (ICD-365.9) - she is sched for right cataract surg by DrBevis in Feb...  ALLERGIC RHINITIS (ICD-477.9) - she uses Alavert & Nasonex Prn...  ASTHMA (ICD-493.90) - on SYMBICORT and improved, but only using it Prn...  HYPERTENSION (ICD-401.9) - controlled on  TOPROL 50mg Bid & HYZAAR 100-12.5 daily (off Lisinopril due to ACE cough)... BP 142/78 today and similiar prev checks... tol meds well... denies HA, fatigue, visual changes, CP, palipit, syncope, dyspnea, edema, etc...  CORONARY ARTERY DISEASE (ICD-414.00) - on ASA 325mg /d now & off Plavix per Walker Kehr...  Cardiolite 9/08 showed CP & HBP response, EF+85%... had cath 10/08 w/ single vessel dis w/ mod ostial LAD stenosis and patent LAD stent, normal LVF.[name]. option for med Rx vs off pump LIMA... she notes some CP/pressure "burning" when walking up a certain hill during her daily walks, but states this is stable and "no worse"...  ~  Myoview 9/09 was neg- no scar or ischemia, EF= 89%, hypertensive response- Lisinopril10 added.  ~  f/u Myoview planned by Cards.  HYPERLIPIDEMIA (ICD-272.4) - on FISH OIL 1200mg  Tid + Flax Seed Oil... she has been intol to statins and all other chol meds... tried low dose Trilipix but stopped this due to "thigh burning"... followed in the Lipid Clinic---  ~  FLP 7/08 showed TChol 217, TG 236, HDL 46, LDL 150...   ~  FLP 6/09 showed TChol 233, TG 263, HDL 46, LDL 150... she agrees to try LOW DOSE Trilipix.  ~  FLP 11/09 showed TChol 162, TG 167, HDL 45, LDL 83...  ~  FLP 11/10 showed TChol 240, TG 271, HDL 45, LDL 150... LipClin Rx w/ FishOil & FlaxSeedOil.  ~  FLP 2/11 showed TChol 285, TG 156, HDL 79, LDL 180... LC is aware & working w/ her regularly.  GERD (ICD-530.81) - on OMEPRAZOLE 40mg /d... last EGD by DrPatterson 8/07 revealed sm HH, & gastritis and dilatation done... (RUT=neg, PPI Rx)... LEVSIN 0.125mg  helps her esoph spasm... Prn Phenergan for nausea.  DIVERTICULOSIS, COLON (ICD-562.10) & IRRITABLE BOWEL SYNDROME (ICD-564.1) - last colonoscopy was 2/03 by DrPatterson showing divertics and 5 mm polyp... f/u colon to be done soon...  FIBROMYALGIA (ICD-729.1) - she c/o chr fatigue, aching/ sore, prev on Tramadol & Skelaxin but not using anything at present... I have  recommended an increase exercise program to her...  HEADACHE (ICD-784.0) - eval at [facility] in 2002, but she notes Rx didn't help...  ~  3/11:  presented w/ muscle contraction HA's> Rx Vicodin 1/2 to 1 Q6H Prn.  DIZZINESS, CHRONIC (ICD-780.4) - MRI 2/09 per Walker Kehr showed atrophy, sm vessel dis, NAD...   Allergies: 1)  ! Penicillin 2)  ! Codeine 3)  ! Simvastatin (Simvastatin) 4)  ! Trilipix (Choline Fenofibrate) 5)  ! Lisinopril (Lisinopril)  Comments:  Nurse/Medical Assistant: The patient's medications and allergies were reviewed with the patient and were updated in the Medication and Allergy Lists.  Past History:  Past Medical History:  GLAUCOMA (ICD-365.9) ALLERGIC RHINITIS (ICD-477.9) ASTHMA (ICD-493.90) HYPERTENSION (ICD-401.9) CORONARY ARTERY DISEASE (ICD-414.00) HYPERLIPIDEMIA (ICD-272.4) HIATAL HERNIA (ICD-553.3) GERD (ICD-530.81) GASTRITIS (ICD-535.50) Hx of ABDOMINAL PAIN, LEFT LOWER QUADRANT (ICD-789.04) DIVERTICULOSIS, COLON (ICD-562.10) IRRITABLE BOWEL SYNDROME (ICD-564.1) COLONIC POLYPS, HX OF (ICD-V12.72) FIBROMYALGIA (ICD-729.1) HEADACHE (ICD-784.0) DIZZINESS, CHRONIC (ICD-780.4)  Past Surgical History: PTCA-Stent 2007  Family History: Reviewed history from 01/01/2008 and no changes required. Family History of Breast Cancer:Sister Family History of Uterine Cancer:Sister Thyroid Cancer: Brother Family History of Prostate Cancer:Father Family History of Diabetes: Sister, brother  Social History: Reviewed history from 12/17/2007 and no changes required. Patient has never smoked.  Alcohol Use - no Daily Caffeine Use-1 Illicit Drug Use - no  Review of Systems      See HPI       The patient complains of decreased hearing, hoarseness, dyspnea on exertion, headaches, muscle weakness, and difficulty walking.  The patient denies anorexia, fever, weight loss, weight gain, vision loss, chest pain, syncope, peripheral edema, prolonged  cough, hemoptysis, abdominal pain, melena, hematochezia, severe indigestion/heartburn, hematuria, incontinence, suspicious skin lesions, transient blindness, depression, unusual weight change, abnormal bleeding, enlarged lymph nodes, and angioedema.    Vital Signs:  Patient profile:   75 year old female Height:      59 inches Weight:      130.38 pounds BMI:     26.43 O2 Sat:      94 % on Room air Temp:     97.3 degrees F oral Pulse rate:   59 / minute BP sitting:   142 / 78  (left arm) Cuff size:   regular  Vitals Entered By: Randell Loop CMA (May 04, 2009 11:15 AM)  O2 Sat at Rest %:  94 O2 Flow:  Room air CC: 4 month ROV & review of mult medical problems... Is Patient Diabetic? No Pain Assessment Patient in pain? yes      Comments meds updated today   Physical Exam  Additional Exam:  WD, WN, 75 y/o WF in NAD... GENERAL:  Alert & oriented; pleasant & cooperative... HEENT:  [place]/AT, EOM-wnl, PERRLA, EACs-clear, TMs-wnl, NOSE-clear, THROAT-clear & wnl. NECK:  Supple w/ fair ROM; no JVD; normal carotid impulses w/o bruits; no thyromegaly or nodules palpated; no  lymphadenopathy. CHEST:  Clear to P & A; without wheezes/ rales/ or rhonchi heard... HEART:  Regular Rhythm; without murmurs/ rubs/ or gallops detected... ABDOMEN:  Soft & nontender; normal bowel sounds; no organomegaly or masses palpated... EXT: without deformities, mild arthritic changes and trigger points, no varicose veins/ venous insuffic/ or edema. NEURO:  CN's intact; motor testing normal; sensory testing normal; gait normal & balance OK. DERM:  No lesions noted; no rash etc...    Impression & Recommendations:  Problem # 1:  HYPERTENSION (ICD-401.9) Meds adjusted by Walker Kehr & improved... Her updated medication list for this problem includes:    Metoprolol Tartrate 50 Mg Tabs (Metoprolol tartrate) .[name]... Take one tablet by mouth two times a day    Losartan Potassium-hctz 100-12.5 Mg Tabs (Losartan  potassium-hctz) .[name]... Take one tablet by mouth two times a day  Problem # 2:  CORONARY ARTERY DISEASE (ICD-414.00) Managed by Walker Kehr for Cards-  recent r/o for MI & BP meds adjusted... Her updated medication list for this problem includes:    Bufferin 325 Mg Tabs (Aspirin buf(cacarb-mgcarb-mgo)) .[name]... Take 1 tab by mouth once daily.    Metoprolol Tartrate 50 Mg Tabs (Metoprolol tartrate) .[name]... Take one tablet by mouth two times a day    Losartan Potassium-hctz 100-12.5 Mg Tabs (Losartan potassium-hctz) .[name]... Take one tablet by mouth two times a day  Problem # 3:  HYPERLIPIDEMIA (ICD-272.4) Followed in the Lipid Clinic on FishOil & FlaxSeedOil...  Problem # 4:  HIATAL HERNIA (ICD-553.3) GI is stable-  s/p colonoscopy f/u as above... Her updated medication list for this problem includes:    Omeprazole 40 Mg Cpdr (Omeprazole) .[name]... Take 1 cap by mouth once daily- 30 min before the 1st meal of the day.    Hyoscyamine Sulfate 0.125 Mg Tabs (Hyoscyamine sulfate) .[name]... As needed  Problem # 5:  FIBROMYALGIA (ICD-729.1) She has mult somatic complaints-  FM etc... she manages w/ exercise etc... Her updated medication list for this problem includes:    Bufferin 325 Mg Tabs (Aspirin buf(cacarb-mgcarb-mgo)) .[name]... Take 1 tab by mouth once daily.    Hydrocodone-acetaminophen 5-500 Mg Tabs (Hydrocodone-acetaminophen) .[name]... Take 1/2 to 1 tab by mouth every 6 h as needed for pain...  Problem # 6:  HEADACHE (ICD-784.0) This is her CC today> prev eval DrAdelman  in 90s w/o effect... symptoms classicly muscle contraction... discussed Rx w/ Vicodin 1/2 to 1 Q6H as needed... Her updated medication list for this problem includes:    Bufferin 325 Mg Tabs (Aspirin buf(cacarb-mgcarb-mgo)) .[name]... Take 1 tab by mouth once daily.    Metoprolol Tartrate 50 Mg Tabs (Metoprolol tartrate) .[name]... Take one tablet by mouth two times a day    Hydrocodone-acetaminophen 5-500 Mg Tabs (Hydrocodone-acetaminophen) .[name]... Take 1/2  to 1 tab by mouth every 6 h as needed for pain...  Problem # 7:  OTHER MEDICAL PROBLEMS AS NOTED>>>  Complete Medication List: 1)  Travatan 0.004 % Soln (Travoprost) .... Use one drop in each eye at bedtime 2)  Alavert 10 Mg Tabs (Loratadine) .... Take 1 tablet by mouth once a day as needed 3)  Nasonex 50 Mcg/act Susp (Mometasone furoate) .[name].. 1-2 puffs two times a day 4)  Symbicort 80-4.5 Mcg/act Aero (Budesonide-formoterol fumarate) .... 2 inhalations two times a day 5)  Bufferin 325 Mg Tabs (Aspirin buf(cacarb-mgcarb-mgo)) .... Take 1 tab by mouth once daily. 6)  Metoprolol Tartrate 50 Mg Tabs (Metoprolol tartrate) .... Take one tablet by mouth two times a day 7)  Losartan Potassium-hctz 100-12.5  Mg Tabs (Losartan potassium-hctz) .... Take one tablet by mouth two times a day 8)  Fish Oil Maximum Strength 1200 Mg Caps (Omega-3 fatty acids) .... Take 1 cap three times a day. 9)  Flax Seed Oil 1000 Mg Caps (Flaxseed (linseed)) .... Take as directed by the lipid clinic 10)  Omeprazole 40 Mg Cpdr (Omeprazole) .... Take 1 cap by mouth once daily- 30 min before the 1st meal of the day. 11)  Hyoscyamine Sulfate 0.125 Mg Tabs (Hyoscyamine sulfate) .... As needed 12)  Promethazine Hcl 25 Mg Tabs (Promethazine hcl) .... As needed 13)  Calcium 600/vitamin D 600-400 Mg-unit Tabs (Calcium carbonate-vitamin d) .... Take 1 tablet by mouth once a day 14)  Vitamin B-12 100 Mcg Tabs (Cyanocobalamin) .[name].. 1 tab by mouth once daily 15)  Vitamin D (ergocalciferol) 50000 Unit Caps (Ergocalciferol) .... Weekly 16)  Hydrocodone-acetaminophen 5-500 Mg Tabs (Hydrocodone-acetaminophen) .... Take 1/2 to 1 tab by mouth every 6 h as needed for pain...  Other Orders: Prescription Created Electronically [phone])  Patient Instructions: 1)  Today we updated your med list- see below.... 2)  We added generic Vicodin to try 1/2 to 1 tab every 6H as needed for the headache... remember to try the heating pad to neck, rest in  darkened room, etc..[name] 3)  Call for any problems.Marland KitchenMarland Kitchen 4)  Keep your prev sched f/u appt... Prescriptions: HYDROCODONE-ACETAMINOPHEN 5-500 MG TABS (HYDROCODONE-ACETAMINOPHEN) take 1/2 to 1 tab by mouth every 6 H as needed for pain...  #30 x prn   Entered and Authorized by:   Michele Mcalpine MD   Signed by:   Michele Mcalpine MD on 05/04/2009   Method used:   Print then Give to Patient   RxID:   [id number]

## 2010-03-27 NOTE — Procedures (Signed)
Summary: Colonoscopy  Patient: [name] Note: All result statuses are Final unless otherwise noted.  Tests: (1) Colonoscopy (COL)   COL Colonoscopy           DONE     [facility]     [address]     [place], [place]  [address]           COLONOSCOPY PROCEDURE REPORT           PATIENT:  [name], [name]  MR#:  [id number]     BIRTHDATE:  07/30/1935, 73 yrs. old  GENDER:  female           ENDOSCOPIST:  Vania Rea. Jarold Motto, MD, [facility]     Referred by:           PROCEDURE DATE:  04/26/2009     PROCEDURE:  Colonoscopy with snare polypectomy     ASA CLASS:  Class III     INDICATIONS:  Elevated Risk Screening           MEDICATIONS:   Fentanyl 50 mcg IV, Versed 4 mg IV           DESCRIPTION OF PROCEDURE:   After the risks benefits and     alternatives of the procedure were thoroughly explained, informed     consent was obtained.  Digital rectal exam was performed and     revealed no abnormalities.   The LB CF-[id number] endoscope     was introduced through the anus and advanced to the cecum, which     was identified by both the appendix and ileocecal valve, without     limitations.  The quality of the prep was excellent, using     MoviPrep.  The instrument was then slowly withdrawn as the colon     was fully examined.     <<PROCEDUREIMAGES>>           FINDINGS:  A diminutive polyp was found in the cecum. COLD SNARE     EXCISED.NO BLEEDING NOTED.  Moderate diverticulosis was found     sigmoid to descending THICKENED RED HAUSTRAL FOLDS NOTED.NO     COLITIS.   Retroflexed views in the rectum revealed no     abnormalities.    The scope was then withdrawn from the patient     and the procedure completed.           COMPLICATIONS:  None           ENDOSCOPIC IMPRESSION:     1) Diminutive polyp in the cecum     2) Moderate diverticulosis in the sigmoid to descending     RECOMMENDATIONS:     1) Repeat colonoscopy in 5 years if polyp adenomatous; otherwise     10  years     2) metamucil or benefiber     3) High fiber diet.           REPEAT EXAM:  No           ______________________________     Vania Rea. Jarold Motto, MD, Clementeen Graham           CC:  Michele Mcalpine, MDPeter Phillips Hay, MD           n.     Rosalie DoctorMarland Kitchen   Vania Rea. Patterson at 04/26/2009 09:29 AM           Tarri Fuller, [phone]  Note: An exclamation mark (!) indicates a result that was not dispersed into the flowsheet. Document  Creation Date: 04/26/2009 9:29 AM _______________________________________________________________________  (1) Order result status: Final Collection or observation date-time: 04/26/2009 09:20 Requested date-time:  Receipt date-time:  Reported date-time:  Referring Physician:   Ordering Physician: Sheryn Bison [phone]) Specimen Source:  Source: Launa Grill Order Number: [id number] Lab site:   Appended Document: Colonoscopy     Procedures Next Due Date:    Colonoscopy: 04/2014

## 2010-03-27 NOTE — Letter (Signed)
Summary: Appointment - Reminder 2  [facility], Main Office  [address]   [place], [place] [address]   Phone: [phone]  Fax: [phone]     March 22, 2009 MRN: [id number]   [facility] [address] [place], [place]  [address]   Dear Ms. Karam,  Our records indicate that it is time to schedule a follow-up appointment with Dr. Eden Emms. It is very important that we reach you to schedule this appointment. We look forward to participating in your health care needs. Please contact us at the number listed above at your earliest convenience to schedule your appointment.  If you are unable to make an appointment at this time, give [place] a call so we can update our records.  Sincerely,   Migdalia Dk [facility] Scheduling Team

## 2010-03-27 NOTE — Progress Notes (Signed)
Summary: Nuclear Pre-Procedure  Phone Note Outgoing Call   Call placed by: Milana Na, EMT-P,  May 09, 2009 1:57 PM Summary of Call: Left message with information on Myoview Information Sheet (see scanned document for details).      Nuclear Med Background Indications for Stress Test: Evaluation for Ischemia  Indications Comments: 04/14/09 ED CP/HTN  R/O MI  History: Angioplasty, Asthma, Heart Catheterization, Myocardial Perfusion Study, Stents  History Comments: '07 Angioplasty/ Stents LAD '08 Heart Cath Patent Stents with 40% residual in LAD 09/09 MPS NL EF 89%     Nuclear Pre-Procedure Cardiac Risk Factors: Hypertension, Lipids Height (in): 59  Nuclear Med Study Referring MD:  P.Sara Lee

## 2010-03-27 NOTE — Progress Notes (Signed)
Summary: chest and back pain  Phone Note Call from Patient   Caller: Patient Call For: nadel Summary of Call: pt says esophagus problem is causing chest and back pain.would like office visit today. Initial call taken by: Rickard Patience,  December 18, 2009 8:19 AM  Follow-up for Phone Call        Spoke with pt.  She c/o increased back pain and pain in her chest x 1 wk- getting worse over the wkend.  She states that she has been taking hycosaamine and this is not helping.  She would like ov with SN- nothing is available. Per TD okay to sched pt to see SN tommorrow pm at 3:30 pm.  I advised that she go to ER sooner if needed and pt agreed Follow-up by: Vernie Murders,  December 18, 2009 9:22 AM

## 2010-03-27 NOTE — Progress Notes (Signed)
Summary: pressure on chest   Phone Note Call from Patient Call back at Home Phone [phone]   Caller: Patient Reason for Call: Talk to Nurse Summary of Call: pt states she feels pressure that comes and goes on her chest toward her back it started last night. no chest pain/sob. pt states she also having esophageal spasms.  Initial call taken by: Roe Coombs,  December 13, 2009 3:25 PM  Follow-up for Phone Call        Pt calls today b/c she had a "bad esophagus spasm" and took 1 sl NTG without relief, except for a bad headache. This did not feel cardiac to her.  She is to see Dr. Jarold Motto about her esophagus in November.  Pt reports that she had cut back on her omeprazole.  This did not work and she is back on full dose.  She also will start back on her hyocysamine.  She will follow-up today with a call to Dr. Norval Gable office. Mylo Red RN

## 2010-03-27 NOTE — Progress Notes (Signed)
Summary: QUESTIONS ABOUT B/P AND HEADACHS  Phone Note Call from Patient Call back at Home Phone [phone]   Caller: Patient Summary of Call: PT HAVE QUESTION ABOUT B/P AND HEADACHES Initial call taken by: Judie Grieve,  May 02, 2009 12:00 PM  Follow-up for Phone Call        pt calling again, states she is going to see Dr [name], Migdalia Dk  May 02, 2009 2:22 PM

## 2010-03-27 NOTE — Progress Notes (Signed)
Summary: req to see sn- headaches  Phone Note Call from Patient Call back at [facility] Phone [phone]   Caller: Patient Call For: nadel Summary of Call: pt c/o headache x 2 wks. wants to see sn (refused appt w/ tp). says dr [name] increased her bp meds and her bp is still high (don't know what it is) but she's mostly concerned about the headaches.  Initial call taken by: Tivis Ringer, CNA,  May 02, 2009 11:39 AM  Follow-up for Phone Call        called and spoke with pt.  pt states she has a hx of headaches and requests to see SN to discuss this.  Pt scheduled to see SN 05-04-2009 at 11am..Megan [facility] LPN  May 02, 1476 1:40 PM

## 2010-03-27 NOTE — Assessment & Plan Note (Signed)
Summary: lpr/jss   Primary Provider:  Alroy Dust, MD   CC:  dyslipidemia follow-up.  History of Present Illness:  Lipid Clinic Visit      The patient comes in today for dyslipidemia follow-up.  The patient has no complaint of medication problems, fever, fatigue, nausea, vomiting, diarrhea, abdominal pain, chest pain, palpitations, shortness of breath, peripheral edema, muscle aches, muscle cramps, back pain, flank pain, rash, and hives.  Dietary compliance review reveals an overall grade of eating 5 or more fruits and vegetables,not  counting carbohydrates, and not limiting fats and TFA's.  Review of exercise habits reveals that the patient is not exercising.  Compliance with medication is fair.    [name] is doing well.  She has been compliant with her fish oil capsules.  She has stopped taking Niacin because of continued "prickling" and flushing reactions unrelieved by the usual measures.   Zetia - myalgias Crestor - myalgias simvastatin - mylagias fenofibrate - burning in thighs   Lipid Management Provider  Leota Sauers, PharmD, BCPS, CPP  Preventive Screening-Counseling & Management  Alcohol-Tobacco     Alcohol drinks/day: 0     Smoking Status: never  Caffeine-Diet-Exercise     Caffeine use/day: 1 cup coffee / day  Current Medications (verified): 1)  Travatan 0.004 % Soln (Travoprost) .... As Needed 2)  Alavert 10 Mg  Tabs (Loratadine) .... Take 1 Tablet By Mouth Once A Day As Needed 3)  Nasonex 50 Mcg/act  Susp (Mometasone Furoate) .[name].. 1-2 Puffs Two Times A Day 4)  Symbicort 80-4.5 Mcg/act  Aero (Budesonide-Formoterol Fumarate) .... 2 Inhalations Two Times A Day 5)  Bufferin 325 Mg Tabs (Aspirin Buf(Cacarb-Mgcarb-Mgo)) .... Take 1 Tab By Mouth Once Daily. 6)  Metoprolol Tartrate 50 Mg Tabs (Metoprolol Tartrate) .... Take One Tablet By Mouth Two Times A Day 7)  Fish Oil Maximum Strength 1200 Mg Caps (Omega-3 Fatty Acids) .... Take 1 Cap Three Times A Day. 8)  Flax Seed  Oil 1000 Mg Caps (Flaxseed (Linseed)) .... Take As Directed By The Lipid Clinic 9)  Omeprazole 40 Mg Cpdr (Omeprazole) .... Take 1 Cap By Mouth Once Daily- 30 Min Before The 1st Meal of The Day. 10)  Hyoscyamine Sulfate 0.125 Mg  Tabs (Hyoscyamine Sulfate) .... As Needed 11)  Promethazine Hcl 25 Mg  Tabs (Promethazine Hcl) .... As Needed 12)  Calcium 600/vitamin D 600-400 Mg-Unit Tabs (Calcium Carbonate-Vitamin D) .... Take 1 Tablet By Mouth Once A Day 13)  Vitamin B-12 100 Mcg Tabs (Cyanocobalamin) .[name].. 1 Tab By Mouth Once Daily 14)  Vitamin D (Ergocalciferol) 50000 Unit Caps (Ergocalciferol) .... Weekly 15)  Moviprep 100 Gm  Solr (Peg-Kcl-Nacl-Nasulf-Na Asc-C) .... As Per Prep Instructions.  Allergies: 1)  ! Penicillin 2)  ! Codeine 3)  ! Simvastatin (Simvastatin) 4)  ! Trilipix (Choline Fenofibrate) 5)  ! Lisinopril (Lisinopril)  Social History: Alcohol drinks/day:  0 Caffeine use/day:  1 cup coffee / day   Vital Signs:  Patient profile:   75 year old female Height:      59 inches Weight:      132 pounds Pulse rate:   70 / minute Pulse rhythm:   regular BP sitting:   160 / 80  (right arm) Cuff size:   regular  Impression & Recommendations:  Problem # 1:  HYPERLIPIDEMIA (ICD-272.4) meds - fish oil 1 gram - take 1 cap three times a day             flaxseed tabs once daily  [name] returns to lipid clinic with no complaints.  She has not been exercising because of the cold.  She has been eating alot of unhealthy foods b/c of holidays and a recent death in the family. She ate whatever people brought over to the house.   She recently started eating more blueberries - and fruit apple, banana, prunes, raisins, oatmeal q am.  However, she does snack  on loads of peanut butter , cheese sticks and chips.  We talked about healthier alternatives.   SHe was not exercising because of the cold weather but will start now that weather is nicer.    TC 284  > goal < 200   TG 156 at goal <  150 (last 271)  HDL 79 at goal > 40   LDL 161 > goal < 100 (last 150) She understands that her LDL is at dangerous levels but she refuses medications because of past myalgias and risk of constipation f/u 4 mo

## 2010-03-27 NOTE — Progress Notes (Signed)
Summary: set up labs  Phone Note Call from Patient Call back at Home Phone [phone]   Caller: Patient Call For: Ryson Bacha Summary of Call: pt requests to have labs put in for her pending appt next week.  Initial call taken by: Tivis Ringer, CNA,  Jul 19, 2009 10:03 AM  Follow-up for Phone Call        please advsie on labs to order. Thanks.Carron Curie CMA  Jul 19, 2009 10:25 AM   per SN--ok for pt to have labs prior to ov--labs are in computer for in the am--called and lmom for pt to make her aware Randell Loop CMA  Jul 19, 2009 2:41 PM

## 2010-03-27 NOTE — Assessment & Plan Note (Signed)
Summary: rov 6 months w/ fasting labs///kp   Primary Care Provider:  Alroy Dust, MD   CC:  3 month ROV & review of mult medical problems....  History of Present Illness: 75 y/o WF here for a follow up visit... she has multiple medical problems as noted below...    ~  May 04, 2009:  presents w/ HA- posterior pain> band-like c/w muscle contraction HA... she under some stress dealing w/ father's estate... she went to the ER w/ CP & elevated BP> she ruled out for MI & was started on HYZAAR 100-12.5 by DrNishan in f/u... she has a repeat Myoview pending next week... she continues her Lipid Management by Walker Kehr & [facility] on [address], FlaxSeedOil, & diet...  she had f/u colonoscopy3/11 by DrPatterson> divertics, 1 polyp= tubular adenoma, f/u 77yrs...    ~  Jul 25, 2009:  she's noted some incr SOB, wheezing over the last 2 weeks w/ the heat... we discussed using the Symbicort, Proair, etc... she saw Walker Kehr 5/11- doing well, stopped her Plavix, no angina & non-ischemic Myoview 3/11... BP controlled on meds;  continues on FishOil & FlaxSeesOil for her Chol (intol to all other meds) followed by the clinic but FLP remains deranged w/ TChol 277 & LDL 193;  we will give her a TDAP today...   Current Problem List:  GLAUCOMA (ICD-365.9) - she is sched for right cataract surg by DrBevis in Feb...  ALLERGIC RHINITIS (ICD-477.9) - she uses Alavert & Nasonex Prn...  ASTHMA (ICD-493.90) - on SYMBICORT80 and improved, but only using it Prn...  ~  5/11:  notes incr chest symptoms in the heat & rec to take the Symbicort 2sp Bid...  HYPERTENSION (ICD-401.9) - controlled on TOPROL 50mg Bid & HYZAAR 100-12.5 daily (off Lisinopril due to ACE cough)... BP 128/80 today and similiar prev checks... tol meds well... denies HA, fatigue, visual changes, CP, palipit, syncope, dyspnea, edema, etc...  CORONARY ARTERY DISEASE (ICD-414.00) - on ASA 81mg /d (intol to 325 she says) & off Plavix per Walker Kehr...   Cardiolite 9/08 showed CP & HBP response, EF+85%... had cath 10/08 w/ single vessel dis w/ mod ostial LAD stenosis and patent LAD stent, normal LVF.[name]. option for med Rx vs off pump LIMA... she notes some CP/ pressure "burning" when walking up a certain hill during her daily walks, but states this is stable and "no worse"...  ~  Myoview 9/09 was neg- no scar or ischemia, EF= 89%, hypertensive response- Lisinopril10 added.  ~  f/u Myoview 3/11 was neg- no scar or ischemia, EF= 86%, norm BP response, fair exerc capacity.  ~  saw Walker Kehr 5/11- no CP, he stopped her Plavix, stable- continue other meds same.  HYPERLIPIDEMIA (ICD-272.4) - on FISH OIL 1200mg  Tid + Flax Seed Oil... she has been intol to statins and all other chol meds... tried low dose Trilipix but stopped this due to "thigh burning"... [facility]---  ~  FLP 7/08 showed TChol 217, TG 236, HDL 46, LDL 150...   ~  FLP 6/09 showed TChol 233, TG 263, HDL 46, LDL 150... she agrees to try LOW DOSE Trilipix.  ~  FLP 11/09 showed TChol 162, TG 167, HDL 45, LDL 83...  ~  FLP 11/10 showed TChol 240, TG 271, HDL 45, LDL 150... [facility] Rx w/ FishOil & FlaxSeedOil.  ~  FLP 2/11 showed TChol 285, TG 156, HDL 79, LDL 180... LC is aware & working w/ her regularly.  ~  FLP 5/11 showed  TChol 277, TG 241, HDL55, LDL 193... continue LC follow up.  GERD (ICD-530.81) - on OMEPRAZOLE 40mg /d... last EGD by DrPatterson 8/07 revealed sm HH, & gastritis and dilatation done... (RUT=neg, PPI Rx)... LEVSIN 0.125mg  helps her esoph spasm... Prn Phenergan for nausea.  DIVERTICULOSIS, COLON (ICD-562.10) & IRRITABLE BOWEL SYNDROME (ICD-564.1)  ~  colonoscopy 2/03 by DrPatterson showed divertics and 5 mm polyp...  ~  colonoscopy 3/11 showed divertics & cecal polyp= tubular adenoma, w/ f/u planned 73yrs.  FIBROMYALGIA (ICD-729.1) - she c/o chr fatigue, aching/ sore, prev on Tramadol & Skelaxin but not using anything at present... I have recommended an  increase exercise program to her...  ~  5/11:  Vit D level = 50 on 50000 u weekly by Rock Nephew, NP  HEADACHE (ICD-784.0) - eval at [facility] in 2002, but she notes Rx didn't help...  ~  3/11:  presented w/ muscle contraction HA's> Rx Vicodin 1/2 to 1 Q6H Prn.  DIZZINESS, CHRONIC (ICD-780.4) - MRI 2/09 per Walker Kehr showed atrophy, sm vessel dis, NAD...  Health Maintenance:  ~  GI:  DrPatterson & up to date on colon screening.  ~  GYN:  yearly f/u w/ Rock Nephew, NP for PAP, Mammogram at [facility], ?last BMD.  ~  Immunizations:  yearly Flu vaccines in fall, Pneumovax in 2007?, TDAP given 5/11...   Current Medications (verified): 1)  Travatan 0.004 % Soln (Travoprost) .... Use One Drop in Each Eye At Bedtime 2)  Alavert 10 Mg  Tabs (Loratadine) .... Take 1 Tablet By Mouth Once A Day As Needed 3)  Nasonex 50 Mcg/act  Susp (Mometasone Furoate) .[name].. 1-2 Puffs Two Times A Day 4)  Symbicort 80-4.5 Mcg/act  Aero (Budesonide-Formoterol Fumarate) .... 2 Inhalations Two Times A Day 5)  Aspirin 81 Mg Tbec (Aspirin) .... Take 1 Tablet By Mouth Once A Day 6)  Metoprolol Tartrate 50 Mg Tabs (Metoprolol Tartrate) .... Take One Tablet By Mouth Two Times A Day 7)  Losartan Potassium-Hctz 100-12.5 Mg Tabs (Losartan Potassium-Hctz) .... Take 1 Tablet By Mouth Once A Day 8)  Fish Oil Maximum Strength 1200 Mg Caps (Omega-3 Fatty Acids) .... Take 1 Cap Three Times A Day. 9)  Flax Seed Oil 1000 Mg Caps (Flaxseed (Linseed)) .... Take As Directed By [facility] 10)  Omeprazole 40 Mg Cpdr (Omeprazole) .... Take 1 Cap By Mouth Once Daily- 30 Min Before The 1st Meal of The Day. 11)  Hyoscyamine Sulfate 0.125 Mg  Tabs (Hyoscyamine Sulfate) .... As Needed 12)  Promethazine Hcl 25 Mg  Tabs (Promethazine Hcl) .... As Needed 13)  Calcium 600/vitamin D 600-400 Mg-Unit Tabs (Calcium Carbonate-Vitamin D) .... Take 1 Tablet By Mouth Once A Day 14)  Vitamin B-12 100 Mcg Tabs (Cyanocobalamin) .[name].. 1 Tab By Mouth  Once Daily 15)  Vitamin D (Ergocalciferol) 50000 Unit Caps (Ergocalciferol) .... Weekly 16)  Hydrocodone-Acetaminophen 5-500 Mg Tabs (Hydrocodone-Acetaminophen) .... Take 1/2 To 1 Tab By Mouth Every 6 H As Needed For Pain...  Allergies (verified): 1)  ! Penicillin 2)  ! Codeine 3)  ! Simvastatin (Simvastatin) 4)  ! Trilipix (Choline Fenofibrate) 5)  ! Lisinopril (Lisinopril)  Comments:  Nurse/Medical Assistant: The patient's medications and allergies were reviewed with the patient and were updated in the Medication and Allergy Lists. Boone Master CNA/MA  Jul 25, 2009 9:09 AM   Past History:  Past Medical History: GLAUCOMA (ICD-365.9) ALLERGIC RHINITIS (ICD-477.9) ASTHMA (ICD-493.90) HYPERTENSION (ICD-401.9) CORONARY ARTERY DISEASE (ICD-414.00) HYPERLIPIDEMIA (ICD-272.4) HIATAL HERNIA (ICD-553.3) GERD (ICD-530.81) GASTRITIS (  ICD-535.50) Hx of ABDOMINAL PAIN, LEFT LOWER QUADRANT (ICD-789.04) DIVERTICULOSIS, COLON (ICD-562.10) IRRITABLE BOWEL SYNDROME (ICD-564.1) COLONIC POLYPS, HX OF (ICD-V12.72) FIBROMYALGIA (ICD-729.1) HEADACHE (ICD-784.0) DIZZINESS, CHRONIC (ICD-780.4)  Past Surgical History: PTCA-Stent 2007  Family History: Reviewed history from 01/01/2008 and no changes required. Family History of Breast Cancer:Sister Family History of Uterine Cancer:Sister Thyroid Cancer: Brother Family History of Prostate Cancer:Father Family History of Diabetes: Sister, brother  Social History: Reviewed history from 12/17/2007 and no changes required. Patient has never smoked.  Alcohol Use - no Daily Caffeine Use-1 Illicit Drug Use - no  Review of Systems      See HPI       The patient complains of dyspnea on exertion.  The patient denies anorexia, fever, weight loss, weight gain, vision loss, decreased hearing, hoarseness, chest pain, syncope, peripheral edema, prolonged cough, headaches, hemoptysis, abdominal pain, melena, hematochezia, severe indigestion/heartburn,  hematuria, incontinence, muscle weakness, suspicious skin lesions, transient blindness, difficulty walking, depression, unusual weight change, abnormal bleeding, enlarged lymph nodes, and angioedema.    Vital Signs:  Patient profile:   75 year old female Height:      59 inches Weight:      136 pounds BMI:     27.57 O2 Sat:      96 % on Room air Temp:     97.7 degrees F oral Pulse rate:   69 / minute BP sitting:   128 / 80  (left arm) Cuff size:   regular  Vitals Entered By: Boone Master CNA/MA (Jul 25, 2009 9:08 AM)  O2 Flow:  Room air CC: 3 month ROV & review of mult medical problems...   Physical Exam  Additional Exam:  WD, WN, 75 y/o WF in NAD... GENERAL:  Alert & oriented; pleasant & cooperative... HEENT:  [place]/AT, EOM-wnl, PERRLA, EACs-clear, TMs-wnl, NOSE-clear, THROAT-clear & wnl. NECK:  Supple w/ fair ROM; no JVD; normal carotid impulses w/o bruits; no thyromegaly or nodules palpated; no lymphadenopathy. CHEST:  Clear to P & A; without wheezes/ rales/ or rhonchi heard... HEART:  Regular Rhythm; without murmurs/ rubs/ or gallops detected... ABDOMEN:  Soft & nontender; normal bowel sounds; no organomegaly or masses palpated... EXT: without deformities, mild arthritic changes and trigger points, no varicose veins/ venous insuffic/ or edema. NEURO:  CN's intact; motor testing normal; sensory testing normal; gait normal & balance OK. DERM:  No lesions noted; no rash etc...    Impression & Recommendations:  Problem # 1:  ASTHMA (ICD-493.90) We discussed using the Symbicort Bid regularly... Her updated medication list for this problem includes:    Symbicort 80-4.5 Mcg/act Aero (Budesonide-formoterol fumarate) .[name]... 2 inhalations two times a day    Proair Hfa 108 (90 Base) Mcg/act Aers (Albuterol sulfate) .[name]... 1-2 sp every 4-6 h as needed for wheezing...  Orders: Prescription Created Electronically [phone])  Problem # 2:  HYPERTENSION (ICD-401.9) BP controlled>  same  meds. Her updated medication list for this problem includes:    Metoprolol Tartrate 50 Mg Tabs (Metoprolol tartrate) .[name]... Take one tablet by mouth two times a day    Losartan Potassium-hctz 100-12.5 Mg Tabs (Losartan potassium-hctz) .[name]... Take 1 tablet by mouth once a day  Problem # 3:  CORONARY ARTERY DISEASE (ICD-414.00) Followed by DrNishan>  seen 5/11, Plavix stopped, no angina... Her updated medication list for this problem includes:    Aspirin 81 Mg Tbec (Aspirin) .[name]... Take 1 tablet by mouth once a day    Metoprolol Tartrate 50 Mg Tabs (Metoprolol tartrate) .[name]... Take one tablet  by mouth two times a day    Losartan Potassium-hctz 100-12.5 Mg Tabs (Losartan potassium-hctz) .[name]... Take 1 tablet by mouth once a day  Problem # 4:  HYPERLIPIDEMIA (ICD-272.4) On diet & lifestyle mod per lipid clinic... intol to all meds.  Problem # 5:  GERD (ICD-530.81) GI is stable... Her updated medication list for this problem includes:    Omeprazole 40 Mg Cpdr (Omeprazole) .[name]... Take 1 cap by mouth once daily- 30 min before the 1st meal of the day.    Hyoscyamine Sulfate 0.125 Mg Tabs (Hyoscyamine sulfate) .[name]... As needed  Problem # 6:  FIBROMYALGIA (ICD-729.1) Aware>  she is stable on the Vicodin, exercise, etc... Her updated medication list for this problem includes:    Aspirin 81 Mg Tbec (Aspirin) .[name]... Take 1 tablet by mouth once a day    Hydrocodone-acetaminophen 5-500 Mg Tabs (Hydrocodone-acetaminophen) .[name]... Take 1/2 to 1 tab by mouth every 6 h as needed for pain...  Problem # 7:  OTHER MEDICAL PROBLEMS AS NOTED>>>  Complete Medication List: 1)  Travatan 0.004 % Soln (Travoprost) .... Use one drop in each eye at bedtime 2)  Alavert 10 Mg Tabs (Loratadine) .... Take 1 tablet by mouth once a day as needed 3)  Nasonex 50 Mcg/act Susp (Mometasone furoate) .[name].. 1-2 puffs two times a day 4)  Symbicort 80-4.5 Mcg/act Aero (Budesonide-formoterol fumarate) .... 2 inhalations two times a day 5)   Proair Hfa 108 (90 Base) Mcg/act Aers (Albuterol sulfate) .[name].. 1-2 sp every 4-6 h as needed for wheezing... 6)  Aspirin 81 Mg Tbec (Aspirin) .... Take 1 tablet by mouth once a day 7)  Metoprolol Tartrate 50 Mg Tabs (Metoprolol tartrate) .... Take one tablet by mouth two times a day 8)  Losartan Potassium-hctz 100-12.5 Mg Tabs (Losartan potassium-hctz) .... Take 1 tablet by mouth once a day 9)  Fish Oil Maximum Strength 1200 Mg Caps (Omega-3 fatty acids) .... Take 1 cap three times a day. 10)  Flax Seed Oil 1000 Mg Caps (Flaxseed (linseed)) .... Take as directed by the lipid clinic 11)  Omeprazole 40 Mg Cpdr (Omeprazole) .... Take 1 cap by mouth once daily- 30 min before the 1st meal of the day. 12)  Hyoscyamine Sulfate 0.125 Mg Tabs (Hyoscyamine sulfate) .... As needed 13)  Promethazine Hcl 25 Mg Tabs (Promethazine hcl) .... As needed 14)  Calcium 600/vitamin D 600-400 Mg-unit Tabs (Calcium carbonate-vitamin d) .... Take 1 tablet by mouth once a day 15)  Vitamin B-12 100 Mcg Tabs (Cyanocobalamin) .[name].. 1 tab by mouth once daily 16)  Vitamin D (ergocalciferol) 50000 Unit Caps (Ergocalciferol) .... Weekly 17)  Hydrocodone-acetaminophen 5-500 Mg Tabs (Hydrocodone-acetaminophen) .... Take 1/2 to 1 tab by mouth every 6 h as needed for pain...  Other Orders: Tdap => 26yrs IM (16109) Admin 1st Vaccine (60454)  Patient Instructions: 1)  Today we updated your med list- see below.... 2)  We refilled your Symbicort & wrote a new perscription for Proair to use as needed for wheezing ("rescue inhaler")... 3)  Today we reviewed your recent lab tests.Marland KitchenMarland Kitchen 4)  Keep your appt w/ the [facility] in June... 5)  Ask Rock Nephew & the [facility] clinic about a follow up Bone Density Test... 6)  Call for any questions.Marland KitchenMarland Kitchen 7)  Please schedule a follow-up appointment in 6 months. Prescriptions: PROAIR HFA 108 (90 BASE) MCG/ACT AERS (ALBUTEROL SULFATE) 1-2 sp every 4-6 H as needed for wheezing...  #1 x 11   Entered by:  Randell Loop CMA   Authorized by:   Michele Mcalpine MD   Signed by:   Randell Loop CMA on 08/02/2009   Method used:   Electronically to        [id number] (retail)       [address]       [place], [place]  [address]       Ph: [phone] or [phone]       Fax: [phone]   RxID:   [id number] SYMBICORT 80-4.5 MCG/ACT  AERO (BUDESONIDE-FORMOTEROL FUMARATE) 2 inhalations two times a day  #1 x 11   Entered by:   Randell Loop CMA   Authorized by:   Michele Mcalpine MD   Signed by:   Randell Loop CMA on 08/02/2009   Method used:   Electronically to        [id number]* (retail)       [address]       [place], [place]  [address]       Ph: [phone] or [phone]       Fax: [phone]   RxID:   [id number]    Immunizations Administered:  Tetanus Vaccine:    Vaccine Type: Tdap    Site: left deltoid    Mfr: GlaxoSmithKline    Dose: 0.5 ml    Route: IM    Given by: Boone Master CNA/MA    Exp. Date: 05/20/2011    Lot #: [id number]    VIS given: 01/13/07 version given Jul 25, 2009.

## 2010-03-27 NOTE — Assessment & Plan Note (Signed)
Summary: rov/f/u bp/dm  Medications Added LOSARTAN POTASSIUM 50 MG TABS (LOSARTAN POTASSIUM) 1 tab by mouth once daily LOSARTAN POTASSIUM-HCTZ 100-12.5 MG TABS (LOSARTAN POTASSIUM-HCTZ) ONE TABLET BY MOUTH ONCE DAILY CRESTOR 10 MG TABS (ROSUVASTATIN CALCIUM) Take 1/2 tablet by mouth every other day LISINOPRIL 20 MG TABS (LISINOPRIL) Take 1 tablet by mouth once a day NEXIUM 40 MG CPDR (ESOMEPRAZOLE MAGNESIUM)  NIASPAN 500 MG CR-TABS (NIACIN (ANTIHYPERLIPIDEMIC)) Take 1 tablet by mouth at bedtime NORVASC 5 MG TABS (AMLODIPINE BESYLATE) Take 1 tablet once a day PLAVIX 75 MG TABS (CLOPIDOGREL BISULFATE)  ZETIA 10 MG TABS (EZETIMIBE)  NIASPAN 500 MG CR-TABS (NIACIN (ANTIHYPERLIPIDEMIC)) Take 1 tablet by mouth at bedtime      Allergies Added:   Primary Provider:  Alroy Dust, MD    History of Present Illness: [name] is seen today for F/U of HTN CAD and elevated lipids.;  She had a DES in 2007 to the LAD.  She has 40% residulal stenosis in LAD on cath in 2008 with a patent stent.  She stopped  Plavix due to cost. She was seen in the ER recently.  I reviewed the records.  She had a hypertensiive urgency with some SSCP.  She R/O and we started her on Cozaar.  She has had a cough with lisinopril.  Since D/C she has still been somewhat anxious and her BP log shows elevated systolics in the 160-170 range. She has not had a ETT in 3 years and with her known CAD and SSCP in the ER I think she should have one.  I discussed options with her at length.  We will start Hyzaar 100/12.5 and F/U in 8 weeks.Her lipids have been hard to control due to multiple drug intolerances. .  She saw a nutritionist  but has not been very good with her diet eating too much fried food. I reviewed her lipid clinic notes which she sees regularly  Current Problems (verified): 1)  Hypertension  (ICD-401.9) 2)  Coronary Artery Disease  (ICD-414.00) 3)  Hyperlipidemia  (ICD-272.4) 4)  Abdominal Pain, Left Lower Quadrant   (ICD-789.04) 5)  Gastritis  (ICD-535.50) 6)  Hiatal Hernia  (ICD-553.3) 7)  Allergic Rhinitis  (ICD-477.9) 8)  Asthma  (ICD-493.90) 9)  Gerd  (ICD-530.81) 10)  Diverticulosis, Colon  (ICD-562.10) 11)  Irritable Bowel Syndrome  (ICD-564.1) 12)  Colonic Polyps, Hx of  (ICD-V12.72) 13)  Fibromyalgia  (ICD-729.1) 14)  Headache  (ICD-784.0) 15)  Dizziness, Chronic  (ICD-780.4) 16)  Glaucoma  (ICD-365.9) 17)  Diverticulitis-colon  (ICD-562.11)  Current Medications (verified): 1)  Travatan 0.004 % Soln (Travoprost) .... As Needed 2)  Alavert 10 Mg  Tabs (Loratadine) .... Take 1 Tablet By Mouth Once A Day As Needed 3)  Nasonex 50 Mcg/act  Susp (Mometasone Furoate) .[name].. 1-2 Puffs Two Times A Day 4)  Symbicort 80-4.5 Mcg/act  Aero (Budesonide-Formoterol Fumarate) .... 2 Inhalations Two Times A Day 5)  Bufferin 325 Mg Tabs (Aspirin Buf(Cacarb-Mgcarb-Mgo)) .... Take 1 Tab By Mouth Once Daily. 6)  Metoprolol Tartrate 50 Mg Tabs (Metoprolol Tartrate) .... Take One Tablet By Mouth Two Times A Day 7)  Fish Oil Maximum Strength 1200 Mg Caps (Omega-3 Fatty Acids) .... Take 1 Cap Three Times A Day. 8)  Flax Seed Oil 1000 Mg Caps (Flaxseed (Linseed)) .... Take As Directed By [facility] 9)  Omeprazole 40 Mg Cpdr (Omeprazole) .... Take 1 Cap By Mouth Once Daily- 30 Min Before The 1st Meal of The Day. 10)  Hyoscyamine Sulfate  0.125 Mg  Tabs (Hyoscyamine Sulfate) .... As Needed 11)  Promethazine Hcl 25 Mg  Tabs (Promethazine Hcl) .... As Needed 12)  Calcium 600/vitamin D 600-400 Mg-Unit Tabs (Calcium Carbonate-Vitamin D) .... Take 1 Tablet By Mouth Once A Day 13)  Vitamin B-12 100 Mcg Tabs (Cyanocobalamin) .[name].. 1 Tab By Mouth Once Daily 14)  Vitamin D (Ergocalciferol) 50000 Unit Caps (Ergocalciferol) .... Weekly 15)  Moviprep 100 Gm  Solr (Peg-Kcl-Nacl-Nasulf-Na Asc-C) .... As Per Prep Instructions. 16)  Losartan Potassium-Hctz 100-12.5 Mg Tabs (Losartan Potassium-Hctz) .... One Tablet By Mouth Once  Daily 17)  Crestor 10 Mg Tabs (Rosuvastatin Calcium) .... Take 1/2 Tablet By Mouth Every Other Day 18)  Lisinopril 20 Mg Tabs (Lisinopril) .... Take 1 Tablet By Mouth Once A Day 19)  Nexium 40 Mg Cpdr (Esomeprazole Magnesium) 20)  Niaspan 500 Mg Cr-Tabs (Niacin (Antihyperlipidemic)) .... Take 1 Tablet By Mouth At Bedtime 21)  Norvasc 5 Mg Tabs (Amlodipine Besylate) .... Take 1 Tablet Once A Day 22)  Plavix 75 Mg Tabs (Clopidogrel Bisulfate) 23)  Zetia 10 Mg Tabs (Ezetimibe) 24)  Niaspan 500 Mg Cr-Tabs (Niacin (Antihyperlipidemic)) .... Take 1 Tablet By Mouth At Bedtime  Allergies (verified): 1)  ! Penicillin 2)  ! Codeine 3)  ! Simvastatin (Simvastatin) 4)  ! Trilipix (Choline Fenofibrate) 5)  ! Lisinopril (Lisinopril)  Past History:  Past Medical History: Last updated: 01/24/2009 HYPERTENSION (ICD-401.9) CORONARY ARTERY DISEASE (ICD-414.00) stent LAD patent at cath 2008 HYPERLIPIDEMIA (ICD-272.4) ABDOMINAL PAIN, LEFT LOWER QUADRANT (ICD-789.04) GASTRITIS (ICD-535.50) HIATAL HERNIA (ICD-553.3) ALLERGIC RHINITIS (ICD-477.9) ASTHMA (ICD-493.90) GERD (ICD-530.81) DIVERTICULOSIS, COLON (ICD-562.10) IRRITABLE BOWEL SYNDROME (ICD-564.1) COLONIC POLYPS, HX OF (ICD-V12.72) FIBROMYALGIA (ICD-729.1) HEADACHE (ICD-784.0) DIZZINESS, CHRONIC (ICD-780.4) GLAUCOMA (ICD-365.9) DIVERTICULITIS-COLON (ICD-562.11)  Past Surgical History: Last updated: 01/24/2009 PTCA-Stent 2007  Family History: Last updated: 01/01/2008 Family History of Breast Cancer:Sister Family History of Uterine Cancer:Sister Thyroid Cancer: Brother Family History of Prostate Cancer:Father Family History of Diabetes: Sister, brother  Social History: Last updated: 12/17/2007 Patient has never smoked.  Alcohol Use - no Daily Caffeine Use-1 Illicit Drug Use - no  Review of Systems       Denies fever, malais, weight loss, blurry vision, decreased visual acuity, cough, sputum, SOB, hemoptysis, pleuritic pain,  palpitaitons, heartburn, abdominal pain, melena, lower extremity edema, claudication, or rash. All other systems reviewed and negative   Vital Signs:  Patient profile:   75 year old female Height:      59 inches Weight:      129 pounds BMI:     26.15 Pulse rate:   66 / minute Resp:     12 per minute BP sitting:   169 / 82  (left arm)  Vitals Entered By: Kem Parkinson (May 02, 2009 8:18 AM)  Physical Exam  General:  Affect appropriate Healthy:  appears stated age HEENT: normal Neck supple with no adenopathy JVP normal no bruits no thyromegaly Lungs clear with no wheezing and good diaphragmatic motion Heart:  S1/S2 no murmur,rub, gallop or click PMI normal Abdomen: benighn, BS positve, no tenderness, no AAA no bruit.  No HSM or HJR Distal pulses intact with no bruits No edema Neuro non-focal Skin warm and dry    Impression & Recommendations:  Problem # 1:  HYPERTENSION (ICD-401.9) Start Hyzaar.  Avoid NSAI"s , ETOH.  OK to take nitro if systolic spikes and she does not feel well The following medications were removed from the medication list:    Losartan Potassium 50 Mg Tabs (Losartan potassium) .Marland KitchenMarland KitchenMarland KitchenMarland Kitchen  One tablet by mouth once daily Her updated medication list for this problem includes:    Bufferin 325 Mg Tabs (Aspirin buf(cacarb-mgcarb-mgo)) .[name]... Take 1 tab by mouth once daily.    Metoprolol Tartrate 50 Mg Tabs (Metoprolol tartrate) .[name]... Take one tablet by mouth two times a day    Losartan Potassium-hctz 100-12.5 Mg Tabs (Losartan potassium-hctz) ..... One tablet by mouth once daily    Lisinopril 20 Mg Tabs (Lisinopril) .[name]... Take 1 tablet by mouth once a day    Norvasc 5 Mg Tabs (Amlodipine besylate) .[name]... Take 1 tablet once a day  Problem # 2:  CORONARY ARTERY DISEASE (ICD-414.00) Recent SSCP in the setting of elevated BP.  History of CAD with DES to LAD.  Exercise myovue Her updated medication list for this problem includes:    Bufferin 325 Mg Tabs (Aspirin  buf(cacarb-mgcarb-mgo)) .[name]... Take 1 tab by mouth once daily.    Metoprolol Tartrate 50 Mg Tabs (Metoprolol tartrate) .[name]... Take one tablet by mouth two times a day    Lisinopril 20 Mg Tabs (Lisinopril) .[name]... Take 1 tablet by mouth once a day    Norvasc 5 Mg Tabs (Amlodipine besylate) .[name]... Take 1 tablet once a day    Plavix 75 Mg Tabs (Clopidogrel bisulfate)  Orders: Nuclear Stress Test (Nuc Stress Test)  Problem # 3:  HYPERLIPIDEMIA (ICD-272.4) Continue F/U with lipid clinic and dietician Her updated medication list for this problem includes:    Crestor 10 Mg Tabs (Rosuvastatin calcium) .[name]... Take 1/2 tablet by mouth every other day    Niaspan 500 Mg Cr-tabs (Niacin (antihyperlipidemic)) .[name]... Take 1 tablet by mouth at bedtime    Zetia 10 Mg Tabs (Ezetimibe)  Problem # 4:  ALLERGIC RHINITIS (ICD-477.9) OK to take alavert but no pseudofed.  Asked her to clear any cold medicine with [place]  Patient Instructions: 1)  Your physician recommends that you schedule a follow-up appointment in: 8 WEEKS 2)  Your physician has recommended you make the following change in your medication: START LOSARTAN HCTZ 100/12.5MG  ONCE DAILY 3)  Your physician has requested that you have an exercise stress myoview.  For further information please visit [email or web address]  Please follow instruction sheet, as given. Prescriptions: LOSARTAN POTASSIUM-HCTZ 100-12.5 MG TABS (LOSARTAN POTASSIUM-HCTZ) ONE TABLET BY MOUTH ONCE DAILY  #30 x 12   Entered by:   Deliah Goody, RN   Authorized by:   Colon Branch, MD, [facility]   Signed by:   Deliah Goody, RN on 05/02/2009   Method used:   Electronically to        [id number] (retail)       [address]       [place], [place]  [address]       Ph: [phone] or [phone]       Fax: [phone]   RxID:   [id number]

## 2010-03-27 NOTE — Letter (Signed)
Summary: Cardiac Catheterization Instructions- [facility]  [facility], Main Office  [address]   [place], [place] [address]   Phone: [phone]  Fax: [phone]     01/16/2010 MRN: [id number]  [name] [address] [place], [place]  [address]  Dear Ms. Heckart,   You are scheduled for a Cardiac Catheterization on [facility] 01-17-10 with Dr. Gala Romney  Please arrive to the 1st floor of the Heart and Vascular Center at [facility] at     10 am       on the day of your procedure. Please do not arrive before 6:30 a.m. Call the Heart and Vascular Center at [phone] if you are unable to make your appointmnet. The Code to get into the parking garage under the building is 2000. Take the elevators to the 1st floor. You must have someone to drive you home. Someone must be with you for the first 24 hours after you arrive home. Please wear clothes that are easy to get on and off and wear slip-on shoes. Do not eat or drink after midnight except water with your medications that morning. Bring all your medications and current insurance cards with you.  ___ DO NOT take these medications before your procedure: ________________________________________________________________  ___ Make sure you take your aspirin.  ___ You may take ALL of your medications with water that morning. ________________________________________________________________________________________________________________________________  ___ DO NOT take ANY medications before your procedure.  ___ Pre-med instructions:  ________________________________________________________________________________________________________________________________  The usual length of stay after your procedure is 2 to 3 hours. This can vary.  If you have any questions, please call the office at the number listed above.   Deliah Goody, RN

## 2010-03-27 NOTE — Progress Notes (Signed)
  Phone Note Other Incoming   Request: Send information Summary of Call: Request for records received from [employer]. Request forwarded to [employer].     

## 2010-03-27 NOTE — Miscellaneous (Signed)
  Clinical Lists Changes  Medications: Removed medication of CRESTOR 10 MG TABS (ROSUVASTATIN CALCIUM) Take 1/2 tablet by mouth every other day Removed medication of LISINOPRIL 20 MG TABS (LISINOPRIL) Take 1 tablet by mouth once a day Removed medication of PLAVIX 75 MG TABS (CLOPIDOGREL BISULFATE) Removed medication of ZETIA 10 MG TABS (EZETIMIBE) Removed medication of NIASPAN 500 MG CR-TABS (NIACIN (ANTIHYPERLIPIDEMIC)) Take 1 tablet by mouth at bedtime Removed medication of MOVIPREP 100 GM  SOLR (PEG-KCL-NACL-NASULF-NA ASC-C) As per prep instructions.

## 2010-03-28 ENCOUNTER — Encounter (HOSPITAL_COMMUNITY): Payer: Medicare Other | Attending: Cardiovascular Disease

## 2010-03-28 DIAGNOSIS — I2582 Chronic total occlusion of coronary artery: Secondary | ICD-10-CM | POA: Insufficient documentation

## 2010-03-28 DIAGNOSIS — Z880 Allergy status to penicillin: Secondary | ICD-10-CM | POA: Insufficient documentation

## 2010-03-28 DIAGNOSIS — E785 Hyperlipidemia, unspecified: Secondary | ICD-10-CM | POA: Insufficient documentation

## 2010-03-28 DIAGNOSIS — Z5189 Encounter for other specified aftercare: Secondary | ICD-10-CM | POA: Insufficient documentation

## 2010-03-28 DIAGNOSIS — Z7982 Long term (current) use of aspirin: Secondary | ICD-10-CM | POA: Insufficient documentation

## 2010-03-28 DIAGNOSIS — K2100 Gastro-esophageal reflux disease with esophagitis, without bleeding: Secondary | ICD-10-CM | POA: Insufficient documentation

## 2010-03-28 DIAGNOSIS — K589 Irritable bowel syndrome without diarrhea: Secondary | ICD-10-CM | POA: Insufficient documentation

## 2010-03-28 DIAGNOSIS — Z8249 Family history of ischemic heart disease and other diseases of the circulatory system: Secondary | ICD-10-CM | POA: Insufficient documentation

## 2010-03-28 DIAGNOSIS — I2510 Atherosclerotic heart disease of native coronary artery without angina pectoris: Secondary | ICD-10-CM | POA: Insufficient documentation

## 2010-03-28 DIAGNOSIS — Z9861 Coronary angioplasty status: Secondary | ICD-10-CM | POA: Insufficient documentation

## 2010-03-28 DIAGNOSIS — I10 Essential (primary) hypertension: Secondary | ICD-10-CM | POA: Insufficient documentation

## 2010-03-28 DIAGNOSIS — Z79899 Other long term (current) drug therapy: Secondary | ICD-10-CM | POA: Insufficient documentation

## 2010-03-28 DIAGNOSIS — J45909 Unspecified asthma, uncomplicated: Secondary | ICD-10-CM | POA: Insufficient documentation

## 2010-03-28 DIAGNOSIS — I200 Unstable angina: Secondary | ICD-10-CM | POA: Insufficient documentation

## 2010-03-28 DIAGNOSIS — Z951 Presence of aortocoronary bypass graft: Secondary | ICD-10-CM | POA: Insufficient documentation

## 2010-03-29 NOTE — Letter (Signed)
Summary: [facility]  [facility]   Imported By: Marylou Mccoy 02/14/2010 12:29:25  _____________________________________________________________________  External Attachment:    Type:   Image     Comment:   External Document

## 2010-03-29 NOTE — Progress Notes (Signed)
Summary: pt feels bad-due to med?  Phone Note Call from Patient   Caller: Patient [phone] Reason for Call: Talk to Nurse Summary of Call: pt states that she feels bad and wonders if it's due to her taking crestor 10mg  1qd Initial call taken by: Glynda Jaeger,  February 06, 2010 11:27 AM  Follow-up for Phone Call        spoke with pt, she states she is having alot of muscle pain. she was discharged from the hosp on crestor everyday. before the hosp she was only taking the crestor twice weekly. she wioll reduce back to twice weekly on the crestor to see if that helps with her symptoms. she will call back if symptoms do not improve Deliah Goody, RN  February 06, 2010 1:02 PM

## 2010-03-29 NOTE — Letter (Signed)
Summary: Sheliah Plane MD  Sheliah Plane MD   Imported By: Lester [place] 03/13/2010 10:21:23  _____________________________________________________________________  External Attachment:    Type:   Image     Comment:   External Document

## 2010-03-29 NOTE — Miscellaneous (Signed)
Summary: [place] Physician Order/Treatment Plan   [facility] Physician Order/Treatment Plan   Imported By: Roderic Ovens 02/13/2010 12:04:36  _____________________________________________________________________  External Attachment:    Type:   Image     Comment:   External Document

## 2010-03-29 NOTE — Progress Notes (Signed)
Summary: pt calling re high bp  Phone Note Call from Patient   Caller: Patient [phone] Reason for Call: Talk to Nurse Summary of Call: pt calling re high blood pressure Initial call taken by: Glynda Jaeger,  March 14, 2010 11:00 AM  Follow-up for Phone Call        Phone Call Completed SPOKE WITH PT C/O  B/P RUNNING HIGH B/P TODAY WAS 197/88 IS BEING TREATED FOR SINUS INFECTION WITH Z -PAK AT THIS TIME.B/P'S  OVER THE LAST WEEK ARE 114-149/61-72 AND HR RUNNNING 60-72.PLEASE ADVISE.[name] Follow-up by: Scherrie Bateman, LPN,  March 14, 2010 12:09 PM  Additional Follow-up for Phone Call Additional follow up Details #1::        Observe.  See what BP dose when sinus infection better Additional Follow-up by: Colon Branch, MD, [facility],  March 14, 2010 12:21 PM    Additional Follow-up for Phone Call Additional follow up Details #2::    PT AWARE. Follow-up by: Scherrie Bateman, LPN,  March 14, 2010 12:27 PM

## 2010-03-29 NOTE — Assessment & Plan Note (Signed)
Summary: F1M/POST CATH/DM   Referring Vivyan Biggers:  Eden Emms Primary Amry Cathy:  Alroy Dust, MD   CC:  dizziness and little sorness in chest due to surgery.  History of Present Illness: [name] is seen today post F/U for CAD with instent restenosis and recent CABG with Dr Tyrone Sage.  Post op course unremarkable.  CRF;s include HTN and elevated lipids.  Rx limited by side effects.  Cough with ACE and myalgias with higher dose statins.  She still has issues with gallstones.  Prior to her CABG she had documented stones by [place] and has seen Dr Jarold Motto and Andrey Campanile.  I told her she is ok to have lap choly as indicated by symptoms of recurrent biliary colic.  She will see Dr Tyrone Sage Thursday and will start cardiac rehab in a couple of weeks.  Current Problems (verified): 1)  Fatty Liver Disease  (ICD-571.8) 2)  Cholelithiasis  (ICD-574.20) 3)  Chest Wall Pain, Acute  (ICD-786.52) 4)  Glaucoma  (ICD-365.9) 5)  Allergic Rhinitis  (ICD-477.9) 6)  Asthma  (ICD-493.90) 7)  Hypertension  (ICD-401.9) 8)  Coronary Artery Disease  (ICD-414.00) 9)  Hyperlipidemia  (ICD-272.4) 10)  Hiatal Hernia  (ICD-553.3) 11)  Gerd  (ICD-530.81) 12)  Gastritis  (ICD-535.50) 13)  Hx of Abdominal Pain, Left Lower Quadrant  (ICD-789.04) 14)  Diverticulosis, Colon  (ICD-562.10) 15)  Irritable Bowel Syndrome  (ICD-564.1) 16)  Colonic Polyps, Hx of  (ICD-V12.72) 17)  Fibromyalgia  (ICD-729.1) 18)  Headache  (ICD-784.0) 19)  Dizziness, Chronic  (ICD-780.4)  Current Medications (verified): 1)  Travatan 0.004 % Soln (Travoprost) .... Use One Drop in Each Eye At Bedtime 2)  Alavert 10 Mg  Tabs (Loratadine) .... Take 1 Tablet By Mouth Once A Day As Needed 3)  Nasonex 50 Mcg/act  Susp (Mometasone Furoate) .[name].. 1-2 Puffs Two Times A Day 4)  Symbicort 80-4.5 Mcg/act  Aero (Budesonide-Formoterol Fumarate) .... 2 Inhalations Two Times A Day 5)  Aspirin 81 Mg Tbec (Aspirin) .... Take 1 Tablet By Mouth Once A Day 6)  Metoprolol  Tartrate 50 Mg Tabs (Metoprolol Tartrate) .... Take One Tablet By Mouth Two Times A Day 7)  Losartan Potassium-Hctz 100-12.5 Mg Tabs (Losartan Potassium-Hctz) .... Take 1 Tablet By Mouth Once A Day 8)  Crestor 10 Mg Tabs (Rosuvastatin Calcium) .... Take 1 Tablet By Mouth Two Days Per Week. 9)  Fish Oil Maximum Strength 1200 Mg Caps (Omega-3 Fatty Acids) .... Take 2 Caps By Mouth Twice Daily. 10)  Omeprazole 40 Mg Cpdr (Omeprazole) .... Take 1 Cap By Mouth Once Daily- 30 Min Before The 1st Meal of The Day. 11)  Hyoscyamine Sulfate 0.125 Mg  Tabs (Hyoscyamine Sulfate) .[name].. 1-2 By Mouth Q 4 Hours As Needed Esophageal Spasms 12)  Vitamin B-12 100 Mcg Tabs (Cyanocobalamin) .[name].. 1 Tab By Mouth Once Daily 13)  Vitamin D (Ergocalciferol) 50000 Unit Caps (Ergocalciferol) .... Weekly 14)  Tramadol Hcl 50 Mg Tabs (Tramadol Hcl) .[name].. 1 Tablet Daily As Needed For Pain 15)  Mucinex Dm 30-600 Mg Xr12h-Tab (Dextromethorphan-Guaifenesin) .... As Needed  Allergies (verified): 1)  ! Penicillin 2)  ! Codeine 3)  ! Simvastatin (Simvastatin) 4)  ! Trilipix (Choline Fenofibrate) 5)  ! Lisinopril (Lisinopril)  Past History:  Past Medical History: Last updated: 12/19/2009 GLAUCOMA (ICD-365.9) ALLERGIC RHINITIS (ICD-477.9) ASTHMA (ICD-493.90) HYPERTENSION (ICD-401.9) CORONARY ARTERY DISEASE (ICD-414.00) HYPERLIPIDEMIA (ICD-272.4) HIATAL HERNIA (ICD-553.3) GERD (ICD-530.81) GASTRITIS (ICD-535.50) Hx of ABDOMINAL PAIN, LEFT LOWER QUADRANT (ICD-789.04) DIVERTICULOSIS, COLON (ICD-562.10) IRRITABLE BOWEL SYNDROME (ICD-564.1) COLONIC POLYPS, HX OF (  ICD-V12.72) FIBROMYALGIA (ICD-729.1) HEADACHE (ICD-784.0) DIZZINESS, CHRONIC (ICD-780.4)  Past Surgical History: Last updated: 12/19/2009 PTCA-Stent 2007  Family History: Last updated: 01/01/2008 Family History of Breast Cancer:Sister Family History of Uterine Cancer:Sister Thyroid Cancer: Brother Family History of Prostate Cancer:Father Family History of  Diabetes: Sister, brother  Social History: Last updated: 12/17/2007 Patient has never smoked.  Alcohol Use - no Daily Caffeine Use-1 Illicit Drug Use - no  Review of Systems       Denies fever, malais, weight loss, blurry vision, decreased visual acuity, cough, sputum, SOB, hemoptysis, pleuritic pain, palpitaitons, heartburn, abdominal pain, melena, lower extremity edema, claudication, or rash.   Vital Signs:  Patient profile:   75 year old female Height:      59 inches Weight:      131 pounds BMI:     26.55 Pulse rate:   67 / minute Resp:     14 per minute BP sitting:   129 / 69  (left arm)  Vitals Entered By: Kem Parkinson (February 13, 2010 10:09 AM)  Physical Exam  General:  Affect appropriate Healthy:  appears stated age HEENT: normal Neck supple with no adenopathy JVP normal no bruits no thyromegaly Lungs clear with no wheezing and good diaphragmatic motion Heart:  S1/S2 no murmur,rub, gallop or click PMI normal Abdomen: benighn, BS positve, no tenderness, no AAA no bruit.  No HSM or HJR Distal pulses intact with no bruits No edema Neuro non-focal Skin warm and dry Sternum well healed   Impression & Recommendations:  Problem # 1:  CHOLELITHIASIS (ICD-574.20) Known gallstones.  F/U primary.  Ok for lap choly if needed  Problem # 2:  HYPERTENSION (ICD-401.9) Well controlled continue current meds Her updated medication list for this problem includes:    Aspirin 81 Mg Tbec (Aspirin) .[name]... Take 1 tablet by mouth once a day    Metoprolol Tartrate 50 Mg Tabs (Metoprolol tartrate) .[name]... Take one tablet by mouth two times a day    Losartan Potassium-hctz 100-12.5 Mg Tabs (Losartan potassium-hctz) .[name]... Take 1 tablet by mouth once a day  Problem # 3:  CORONARY ARTERY DISEASE (ICD-414.00) Stable S/P CABG with normal LV  F/U [place] this week  Cardiac rehab Her updated medication list for this problem includes:    Aspirin 81 Mg Tbec (Aspirin) .[name]... Take 1  tablet by mouth once a day    Metoprolol Tartrate 50 Mg Tabs (Metoprolol tartrate) .[name]... Take one tablet by mouth two times a day  Problem # 4:  HYPERLIPIDEMIA (ICD-272.4) Continue low dose statin as she is intolerant to higher frequency Her updated medication list for this problem includes:    Crestor 10 Mg Tabs (Rosuvastatin calcium) .[name]... Take 1 tablet by mouth two days per week.  CHOL: 198 (12/04/2009)   LDL: DEL (05/03/2008)   HDL: 52.60 (12/04/2009)   TG: 215.0 (12/04/2009)  Patient Instructions: 1)  Your physician recommends that you schedule a follow-up appointment in: 3 months with Dr Eden Emms 2)  Your physician recommends that you continue on your current medications as directed. Please refer to the Current Medication list given to you today.

## 2010-03-29 NOTE — Consult Note (Signed)
Summary: [facility] Consultation Report  [facility] Consultation Report   Imported By: Earl Many 03/15/2010 18:50:20  _____________________________________________________________________  External Attachment:    Type:   Image     Comment:   External Document

## 2010-03-30 ENCOUNTER — Encounter (HOSPITAL_COMMUNITY): Payer: Medicare Other

## 2010-04-02 ENCOUNTER — Encounter (HOSPITAL_COMMUNITY): Payer: Medicare Other

## 2010-04-04 ENCOUNTER — Encounter (HOSPITAL_COMMUNITY): Payer: Medicare Other

## 2010-04-06 ENCOUNTER — Encounter (HOSPITAL_COMMUNITY): Payer: Medicare Other

## 2010-04-09 ENCOUNTER — Encounter (HOSPITAL_COMMUNITY): Payer: Medicare Other

## 2010-04-10 DIAGNOSIS — E785 Hyperlipidemia, unspecified: Secondary | ICD-10-CM

## 2010-04-11 ENCOUNTER — Encounter (HOSPITAL_COMMUNITY): Payer: Medicare Other

## 2010-04-12 NOTE — Miscellaneous (Signed)
Summary: [facility]iac Progress Report   [facility]iac Progress Report   Imported By: Roderic Ovens 04/02/2010 14:23:34  _____________________________________________________________________  External Attachment:    Type:   Image     Comment:   External Document

## 2010-04-13 ENCOUNTER — Encounter (HOSPITAL_COMMUNITY): Payer: Medicare Other

## 2010-04-16 ENCOUNTER — Encounter (HOSPITAL_COMMUNITY): Payer: Medicare Other

## 2010-04-17 ENCOUNTER — Other Ambulatory Visit: Payer: Medicare PPO

## 2010-04-17 ENCOUNTER — Encounter (INDEPENDENT_AMBULATORY_CARE_PROVIDER_SITE_OTHER): Payer: Self-pay | Admitting: *Deleted

## 2010-04-17 ENCOUNTER — Other Ambulatory Visit: Payer: Self-pay | Admitting: Cardiovascular Disease

## 2010-04-17 ENCOUNTER — Ambulatory Visit: Payer: Self-pay | Admitting: Pulmonary Disease

## 2010-04-17 DIAGNOSIS — E785 Hyperlipidemia, unspecified: Secondary | ICD-10-CM

## 2010-04-17 DIAGNOSIS — Z79899 Other long term (current) drug therapy: Secondary | ICD-10-CM

## 2010-04-17 LAB — HEPATIC FUNCTION PANEL
ALT: 30 U/L (ref 0–35)
Total Bilirubin: 0.8 mg/dL (ref 0.3–1.2)

## 2010-04-17 LAB — LIPID PANEL
HDL: 76 mg/dL (ref >39.00)
Total CHOL/HDL Ratio: 3
Triglycerides: 99 mg/dL (ref 0.0–149.0)
VLDL: 19.8 mg/dL (ref 0.0–40.0)

## 2010-04-17 LAB — LDL CHOLESTEROL, DIRECT: Direct LDL: 118.6 mg/dL

## 2010-04-18 ENCOUNTER — Encounter (HOSPITAL_COMMUNITY): Payer: Medicare Other

## 2010-04-18 NOTE — H&P (Signed)
NAMERUBYANN, [name]            ACCOUNT NO.:  [id number]  MEDICAL RECORD NO.:  [id number]          PATIENT TYPE:  REC  LOCATION:  REHS                         FACILITY:  [facility]  PHYSICIAN:  Bevelyn Buckles. Bensimhon, MDDATE OF BIRTH:  1935-12-10  DATE OF ADMISSION:  02/22/2010 DATE OF DISCHARGE:                             HISTORY & PHYSICAL   PRIMARY CARE PHYSICIAN:  [name]. [name], MD  CARDIOLOGIST:  [name]. Eden Emms, MD, [facility]  REASON FOR ADMISSION:  Critical coronary artery disease, admitting for bypass surgery workup.  [name] is a very pleasant 75 year old woman followed by Dr. Eden Emms. She has a history of coronary artery disease with drug-eluting stent to the LAD in 2007.  She also has a history of hypertension and hyperlipidemia.  She saw Dr. Eden Emms recently for chest pain.  She had a Myoview which was positive for ischemia.  She was referred today for cardiac catheterization.  She underwent catheterization in the Outpatient Cath Catheterization Lab and she was found to have severe one- vessel coronary artery disease with in-stent restenosis and total occlusion of the previous drug-eluting stents placed in the LAD.  Her LV function was normal.  She is being admitted for evaluation for bypass surgery.  REVIEW OF SYSTEMS:  She does admit to intermittent chest pain and exertional dyspnea as well as some arthritis pain.  She denies any fevers, chills, nausea, vomiting, or bleeding.  All systems are negative except for HPI and problem list.  PROBLEM LIST: 1. Coronary artery disease as described above. 2. Hypertension. 3. Hyperlipidemia. 4. Gallstones. 5. Gastroesophageal reflux disease.  CURRENT MEDICATIONS:  Nasonex, aspirin, metoprolol, losartan, hydrochlorothiazide, Crestor, fish oil, omeprazole, and Travatan eye drops.  ALLERGIES: 1. PENICILLIN. 2. CODEINE. 3. SIMVASTATIN. 4. TRIPLEX. 5. LISINOPRIL which she stopped due to cough.  SOCIAL HISTORY:  She is  married.  She works part-time as a Manufacturing systems engineer.  She does not smoke or use alcohol.  FAMILY HISTORY:  Her father died at 8 due to a stroke, he had a history of coronary artery disease and had bypass surgery and her mother also had coronary artery disease and had bypass surgery.  PHYSICAL EXAMINATION:  GENERAL:  She is in no acute distress.  She is status post cardiac catheterization. VITAL SIGNS:  Stable. HEENT:  Normal. NECK:  Supple.  There is no JVD.  Carotid are 2+ bilaterally without any bruits.  There is no lymphadenopathy or thyromegaly. CARDIAC:  PMI is nondisplaced.  Regular rate and rhythm with no obvious murmurs, rubs, or gallops. LUNGS:  Clear. ABDOMEN:  Soft, nontender, and nondistended.  No hepatosplenomegaly.  No bruits.  No mass. EXTREMITIES:  Warm with no cyanosis, clubbing, or edema.  LABORATORY DATA:  Creatinine 0.7, potassium 4.5, hematocrit 38, and platelets of 319.  INR is 1.  Chest x-ray shows no acute process.  EKG shows sinus rhythm.  ASSESSMENT: 1. Coronary artery disease with in-stent restenosis of the previous     drug-eluting stents. 2. Normal left ventricular function. 3. Hypertension. 4. Hyperlipidemia.  PLANS/DISCUSSION:  She will be admitted.  We will start her on heparin postcatheterization.  Once her groin site is  stable, we will have cardiothoracic surgery consult on her for possible bypass surgery.     Bevelyn Buckles. Bensimhon, MD     DRB/MEDQ  D:  03/22/2010  T:  03/23/2010  Job:  [id number]  Electronically Signed by Arvilla Meres MD on 04/18/2010 01:55:07 PM

## 2010-04-18 NOTE — Progress Notes (Signed)
Summary: [facility]: Office Visit  [facility]: Office Visit   Imported By: Earl Many 04/05/2010 17:48:38  _____________________________________________________________________  External Attachment:    Type:   Image     Comment:   External Document

## 2010-04-19 ENCOUNTER — Ambulatory Visit: Payer: Self-pay

## 2010-04-19 ENCOUNTER — Encounter: Payer: Self-pay | Admitting: Pulmonary Disease

## 2010-04-19 ENCOUNTER — Ambulatory Visit: Payer: Self-pay | Admitting: Pulmonary Disease

## 2010-04-20 ENCOUNTER — Encounter (HOSPITAL_COMMUNITY): Payer: Medicare Other

## 2010-04-20 ENCOUNTER — Encounter (INDEPENDENT_AMBULATORY_CARE_PROVIDER_SITE_OTHER): Payer: Self-pay | Admitting: *Deleted

## 2010-04-23 ENCOUNTER — Encounter (HOSPITAL_COMMUNITY): Payer: Medicare Other

## 2010-04-24 NOTE — Letter (Signed)
Summary: Custom - Lipid  [facility], Main Office  [address]   [place], [place] [address]   Phone: [phone]  Fax: [phone]     April 20, 2010 MRN: [id number]   [facility] [address] [place], [place]  [address]   Dear Ms. Wilbourn,  We have reviewed your cholesterol results.  They are as follows:     Total Cholesterol:    204 (Desirable: less than 200)       HDL  Cholesterol:     76.00  (Desirable: greater than 40 for men and 50 for women)       LDL Cholesterol:       118.6  (Desirable: less than 100 for low risk and less than 70 for moderate to high risk)       Triglycerides:       99.0  (Desirable: less than 150)  Our recommendations include:These numbers look good. Continue on the same medicine. Liver function is normal. Take care, Dr. Leanora Cover.    Call our office at the number listed above if you have any questions.  Lowering your LDL cholesterol is important, but it is only one of a large number of "risk factors" that may indicate that you are at risk for heart disease, stroke or other complications of hardening of the arteries.  Other risk factors include:   A.  Cigarette Smoking* B.  High Blood Pressure* C.  Obesity* D.   Low HDL Cholesterol (see yours above)* E.   Diabetes Mellitus (higher risk if your is uncontrolled) F.  Family history of premature heart disease G.  Previous history of stroke or cardiovascular disease    *These are risk factors YOU HAVE CONTROL OVER.  For more information, visit .  There is now evidence that lowering the TOTAL CHOLESTEROL AND LDL CHOLESTEROL can reduce the risk of heart disease.  The American Heart Association recommends the following guidelines for the treatment of elevated cholesterol:  1.  If there is now current heart disease and less than two risk factors, TOTAL CHOLESTEROL should be less than 200 and LDL CHOLESTEROL should be less than 100. 2.  If there is current heart disease or  two or more risk factors, TOTAL CHOLESTEROL should be less than 200 and LDL CHOLESTEROL should be less than 70.  A diet low in cholesterol, saturated fat, and calories is the cornerstone of treatment for elevated cholesterol.  Cessation of smoking and exercise are also important in the management of elevated cholesterol and preventing vascular disease.  Studies have shown that 30 to 60 minutes of physical activity most days can help lower blood pressure, lower cholesterol, and keep your weight at a healthy level.  Drug therapy is used when cholesterol levels do not respond to therapeutic lifestyle changes (smoking cessation, diet, and exercise) and remains unacceptably high.  If medication is started, it is important to have you levels checked periodically to evaluate the need for further treatment options.  Thank you,    [employer] Team

## 2010-04-25 ENCOUNTER — Encounter (HOSPITAL_COMMUNITY): Payer: Medicare Other

## 2010-04-27 ENCOUNTER — Encounter (HOSPITAL_COMMUNITY): Payer: Medicare Other

## 2010-04-30 ENCOUNTER — Encounter (HOSPITAL_COMMUNITY): Payer: Medicare Other

## 2010-05-02 ENCOUNTER — Encounter (HOSPITAL_COMMUNITY): Payer: Medicare Other

## 2010-05-03 ENCOUNTER — Ambulatory Visit: Payer: Self-pay

## 2010-05-04 ENCOUNTER — Encounter (HOSPITAL_COMMUNITY): Payer: Medicare Other

## 2010-05-07 ENCOUNTER — Encounter (HOSPITAL_COMMUNITY): Payer: Medicare Other | Attending: Cardiovascular Disease

## 2010-05-07 DIAGNOSIS — K589 Irritable bowel syndrome without diarrhea: Secondary | ICD-10-CM | POA: Insufficient documentation

## 2010-05-07 DIAGNOSIS — Z7982 Long term (current) use of aspirin: Secondary | ICD-10-CM | POA: Insufficient documentation

## 2010-05-07 DIAGNOSIS — E785 Hyperlipidemia, unspecified: Secondary | ICD-10-CM | POA: Insufficient documentation

## 2010-05-07 DIAGNOSIS — Z880 Allergy status to penicillin: Secondary | ICD-10-CM | POA: Insufficient documentation

## 2010-05-07 DIAGNOSIS — Z9861 Coronary angioplasty status: Secondary | ICD-10-CM | POA: Insufficient documentation

## 2010-05-07 DIAGNOSIS — I2582 Chronic total occlusion of coronary artery: Secondary | ICD-10-CM | POA: Insufficient documentation

## 2010-05-07 DIAGNOSIS — Z8249 Family history of ischemic heart disease and other diseases of the circulatory system: Secondary | ICD-10-CM | POA: Insufficient documentation

## 2010-05-07 DIAGNOSIS — I200 Unstable angina: Secondary | ICD-10-CM | POA: Insufficient documentation

## 2010-05-07 DIAGNOSIS — Z5189 Encounter for other specified aftercare: Secondary | ICD-10-CM | POA: Insufficient documentation

## 2010-05-07 DIAGNOSIS — I2510 Atherosclerotic heart disease of native coronary artery without angina pectoris: Secondary | ICD-10-CM | POA: Insufficient documentation

## 2010-05-07 DIAGNOSIS — I10 Essential (primary) hypertension: Secondary | ICD-10-CM | POA: Insufficient documentation

## 2010-05-07 DIAGNOSIS — Z951 Presence of aortocoronary bypass graft: Secondary | ICD-10-CM | POA: Insufficient documentation

## 2010-05-07 DIAGNOSIS — K2100 Gastro-esophageal reflux disease with esophagitis, without bleeding: Secondary | ICD-10-CM | POA: Insufficient documentation

## 2010-05-07 DIAGNOSIS — Z79899 Other long term (current) drug therapy: Secondary | ICD-10-CM | POA: Insufficient documentation

## 2010-05-07 DIAGNOSIS — J45909 Unspecified asthma, uncomplicated: Secondary | ICD-10-CM | POA: Insufficient documentation

## 2010-05-08 ENCOUNTER — Encounter: Payer: Self-pay | Admitting: Cardiology

## 2010-05-08 ENCOUNTER — Ambulatory Visit (INDEPENDENT_AMBULATORY_CARE_PROVIDER_SITE_OTHER): Payer: Medicare Other | Admitting: Pulmonary Disease

## 2010-05-08 ENCOUNTER — Encounter: Payer: Self-pay | Admitting: Pulmonary Disease

## 2010-05-08 DIAGNOSIS — J45909 Unspecified asthma, uncomplicated: Secondary | ICD-10-CM

## 2010-05-08 DIAGNOSIS — K589 Irritable bowel syndrome without diarrhea: Secondary | ICD-10-CM

## 2010-05-08 DIAGNOSIS — E785 Hyperlipidemia, unspecified: Secondary | ICD-10-CM

## 2010-05-08 DIAGNOSIS — I2510 Atherosclerotic heart disease of native coronary artery without angina pectoris: Secondary | ICD-10-CM

## 2010-05-08 DIAGNOSIS — I10 Essential (primary) hypertension: Secondary | ICD-10-CM

## 2010-05-08 DIAGNOSIS — K219 Gastro-esophageal reflux disease without esophagitis: Secondary | ICD-10-CM

## 2010-05-08 DIAGNOSIS — K5730 Diverticulosis of large intestine without perforation or abscess without bleeding: Secondary | ICD-10-CM

## 2010-05-08 DIAGNOSIS — J309 Allergic rhinitis, unspecified: Secondary | ICD-10-CM

## 2010-05-08 LAB — BASIC METABOLIC PANEL
BUN: 11 mg/dL (ref 6–23)
BUN: 7 mg/dL (ref 6–23)
BUN: 7 mg/dL (ref 6–23)
BUN: 7 mg/dL (ref 6–23)
CO2: 26 mEq/L (ref 19–32)
CO2: 28 mEq/L (ref 19–32)
CO2: 31 mEq/L (ref 19–32)
CO2: 32 mEq/L (ref 19–32)
Calcium: 7.9 mg/dL — ABNORMAL LOW (ref 8.4–10.5)
Calcium: 8.2 mg/dL — ABNORMAL LOW (ref 8.4–10.5)
Calcium: 8.3 mg/dL — ABNORMAL LOW (ref 8.4–10.5)
Calcium: 9.8 mg/dL (ref 8.4–10.5)
Chloride: 103 mEq/L (ref 96–112)
Chloride: 103 mEq/L (ref 96–112)
Chloride: 107 mEq/L (ref 96–112)
Chloride: 97 mEq/L (ref 96–112)
Creatinine, Ser: 0.55 mg/dL (ref 0.4–1.2)
Creatinine, Ser: 0.62 mg/dL (ref 0.4–1.2)
Creatinine, Ser: 0.63 mg/dL (ref 0.4–1.2)
Creatinine, Ser: 0.74 mg/dL (ref 0.4–1.2)
GFR calc Af Amer: >60 mL/min (ref >60)
GFR calc Af Amer: >60 mL/min (ref >60)
GFR calc Af Amer: >60 mL/min (ref >60)
GFR calc Af Amer: >60 mL/min (ref >60)
GFR calc non Af Amer: >60 mL/min (ref >60)
GFR calc non Af Amer: >60 mL/min (ref >60)
GFR calc non Af Amer: >60 mL/min (ref >60)
GFR calc non Af Amer: >60 mL/min (ref >60)
Glucose, Bld: 101 mg/dL — ABNORMAL HIGH (ref 70–99)
Glucose, Bld: 102 mg/dL — ABNORMAL HIGH (ref 70–99)
Glucose, Bld: 103 mg/dL — ABNORMAL HIGH (ref 70–99)
Glucose, Bld: 132 mg/dL — ABNORMAL HIGH (ref 70–99)
Potassium: 4.2 mEq/L (ref 3.5–5.1)
Potassium: 4.3 mEq/L (ref 3.5–5.1)
Potassium: 4.4 mEq/L (ref 3.5–5.1)
Potassium: 4.6 mEq/L (ref 3.5–5.1)
Sodium: 136 mEq/L (ref 135–145)
Sodium: 136 mEq/L (ref 135–145)
Sodium: 137 mEq/L (ref 135–145)
Sodium: 140 mEq/L (ref 135–145)

## 2010-05-08 LAB — CBC
HCT: 29.6 % — ABNORMAL LOW (ref 36.0–46.0)
HCT: 31.2 % — ABNORMAL LOW (ref 36.0–46.0)
HCT: 31.4 % — ABNORMAL LOW (ref 36.0–46.0)
HCT: 32.4 % — ABNORMAL LOW (ref 36.0–46.0)
HCT: 32.8 % — ABNORMAL LOW (ref 36.0–46.0)
HCT: 34.3 % — ABNORMAL LOW (ref 36.0–46.0)
HCT: 39 % (ref 36.0–46.0)
Hemoglobin: 10.3 g/dL — ABNORMAL LOW (ref 12.0–15.0)
Hemoglobin: 10.9 g/dL — ABNORMAL LOW (ref 12.0–15.0)
Hemoglobin: 11 g/dL — ABNORMAL LOW (ref 12.0–15.0)
Hemoglobin: 11.3 g/dL — ABNORMAL LOW (ref 12.0–15.0)
Hemoglobin: 11.7 g/dL — ABNORMAL LOW (ref 12.0–15.0)
Hemoglobin: 13.2 g/dL (ref 12.0–15.0)
Hemoglobin: 9.8 g/dL — ABNORMAL LOW (ref 12.0–15.0)
MCH: 29.6 pg (ref 26.0–34.0)
MCH: 29.7 pg (ref 26.0–34.0)
MCH: 29.7 pg (ref 26.0–34.0)
MCH: 29.8 pg (ref 26.0–34.0)
MCH: 30.1 pg (ref 26.0–34.0)
MCH: 30.2 pg (ref 26.0–34.0)
MCH: 30.3 pg (ref 26.0–34.0)
MCHC: 32.8 g/dL (ref 30.0–36.0)
MCHC: 33.1 g/dL (ref 30.0–36.0)
MCHC: 33.2 g/dL (ref 30.0–36.0)
MCHC: 33.8 g/dL (ref 30.0–36.0)
MCHC: 34 g/dL (ref 30.0–36.0)
MCHC: 34.1 g/dL (ref 30.0–36.0)
MCHC: 34.5 g/dL (ref 30.0–36.0)
MCHC: 34.9 g/dL (ref 30.0–36.0)
MCV: 86.7 fL (ref 78.0–100.0)
MCV: 87.5 fL (ref 78.0–100.0)
MCV: 87.7 fL (ref 78.0–100.0)
MCV: 87.8 fL (ref 78.0–100.0)
MCV: 88.8 fL (ref 78.0–100.0)
MCV: 89.7 fL (ref 78.0–100.0)
MCV: 90.2 fL (ref 78.0–100.0)
Platelets: 112 10*3/uL — ABNORMAL LOW (ref 150–400)
Platelets: 136 10*3/uL — ABNORMAL LOW (ref 150–400)
Platelets: 148 10*3/uL — ABNORMAL LOW (ref 150–400)
Platelets: 149 10*3/uL — ABNORMAL LOW (ref 150–400)
Platelets: 154 10*3/uL (ref 150–400)
Platelets: 180 10*3/uL (ref 150–400)
Platelets: 277 10*3/uL (ref 150–400)
Platelets: 293 10*3/uL (ref 150–400)
RBC: 3.3 MIL/uL — ABNORMAL LOW (ref 3.87–5.11)
RBC: 3.48 MIL/uL — ABNORMAL LOW (ref 3.87–5.11)
RBC: 3.6 MIL/uL — ABNORMAL LOW (ref 3.87–5.11)
RBC: 3.65 MIL/uL — ABNORMAL LOW (ref 3.87–5.11)
RBC: 3.74 MIL/uL — ABNORMAL LOW (ref 3.87–5.11)
RBC: 3.92 MIL/uL (ref 3.87–5.11)
RBC: 4.44 MIL/uL (ref 3.87–5.11)
RDW: 13.8 % (ref 11.5–15.5)
RDW: 14.1 % (ref 11.5–15.5)
RDW: 14.3 % (ref 11.5–15.5)
RDW: 14.3 % (ref 11.5–15.5)
RDW: 14.4 % (ref 11.5–15.5)
RDW: 14.8 % (ref 11.5–15.5)
RDW: 14.8 % (ref 11.5–15.5)
RDW: 14.9 % (ref 11.5–15.5)
WBC: 10.3 10*3/uL (ref 4.0–10.5)
WBC: 12.4 10*3/uL — ABNORMAL HIGH (ref 4.0–10.5)
WBC: 7.1 10*3/uL (ref 4.0–10.5)
WBC: 7.4 10*3/uL (ref 4.0–10.5)
WBC: 7.8 10*3/uL (ref 4.0–10.5)
WBC: 9.1 10*3/uL (ref 4.0–10.5)
WBC: 9.5 10*3/uL (ref 4.0–10.5)
WBC: 9.6 10*3/uL (ref 4.0–10.5)

## 2010-05-08 LAB — POCT I-STAT 3, ART BLOOD GAS (G3+)
Acid-base deficit: 2 mmol/L (ref 0.0–2.0)
Bicarbonate: 23.8 mEq/L (ref 20.0–24.0)
Bicarbonate: 24.4 mEq/L — ABNORMAL HIGH (ref 20.0–24.0)
O2 Saturation: 100 %
O2 Saturation: 96 %
TCO2: 25 mmol/L (ref 0–100)
TCO2: 26 mmol/L (ref 0–100)
pCO2 arterial: 35.4 mmHg (ref 35.0–45.0)
pCO2 arterial: 38.2 mmHg (ref 35.0–45.0)
pH, Arterial: 7.43 — ABNORMAL HIGH (ref 7.350–7.400)
pO2, Arterial: 358 mmHg — ABNORMAL HIGH (ref 80.0–100.0)

## 2010-05-08 LAB — BLOOD GAS, ARTERIAL
Acid-Base Excess: 3.5 mmol/L — ABNORMAL HIGH (ref 0.0–2.0)
Bicarbonate: 27.9 mEq/L — ABNORMAL HIGH (ref 20.0–24.0)
Drawn by: 29017
FIO2: 0.21 %
O2 Saturation: 93.7 %
Patient temperature: 98.6
TCO2: 29.3 mmol/L (ref 0–100)
pCO2 arterial: 45.7 mmHg — ABNORMAL HIGH (ref 35.0–45.0)
pH, Arterial: 7.403 — ABNORMAL HIGH (ref 7.350–7.400)
pO2, Arterial: 69.2 mmHg — ABNORMAL LOW (ref 80.0–100.0)

## 2010-05-08 LAB — GLUCOSE, CAPILLARY
Glucose-Capillary: 101 mg/dL — ABNORMAL HIGH (ref 70–99)
Glucose-Capillary: 109 mg/dL — ABNORMAL HIGH (ref 70–99)
Glucose-Capillary: 119 mg/dL — ABNORMAL HIGH (ref 70–99)
Glucose-Capillary: 139 mg/dL — ABNORMAL HIGH (ref 70–99)
Glucose-Capillary: 141 mg/dL — ABNORMAL HIGH (ref 70–99)
Glucose-Capillary: 142 mg/dL — ABNORMAL HIGH (ref 70–99)
Glucose-Capillary: 156 mg/dL — ABNORMAL HIGH (ref 70–99)
Glucose-Capillary: 84 mg/dL (ref 70–99)
Glucose-Capillary: 92 mg/dL (ref 70–99)
Glucose-Capillary: 94 mg/dL (ref 70–99)
Glucose-Capillary: 99 mg/dL (ref 70–99)

## 2010-05-08 LAB — CREATININE, SERUM
Creatinine, Ser: 0.6 mg/dL (ref 0.4–1.2)
Creatinine, Ser: 0.62 mg/dL (ref 0.4–1.2)
GFR calc Af Amer: >60 mL/min (ref >60)
GFR calc Af Amer: >60 mL/min (ref >60)
GFR calc non Af Amer: >60 mL/min (ref >60)
GFR calc non Af Amer: >60 mL/min (ref >60)

## 2010-05-08 LAB — CROSSMATCH
ABO/RH(D): A POS
Antibody Screen: NEGATIVE
Unit division: 0
Unit division: 0

## 2010-05-08 LAB — URINE MICROSCOPIC-ADD ON

## 2010-05-08 LAB — POCT I-STAT, CHEM 8
BUN: 6 mg/dL (ref 6–23)
Calcium, Ion: 1.08 mmol/L — ABNORMAL LOW (ref 1.12–1.32)
Calcium, Ion: 1.18 mmol/L (ref 1.12–1.32)
Creatinine, Ser: 0.6 mg/dL (ref 0.4–1.2)
Glucose, Bld: 113 mg/dL — ABNORMAL HIGH (ref 70–99)
HCT: 34 % — ABNORMAL LOW (ref 36.0–46.0)
Sodium: 141 mEq/L (ref 135–145)
TCO2: 23 mmol/L (ref 0–100)
TCO2: 27 mmol/L (ref 0–100)

## 2010-05-08 LAB — HEMOGLOBIN A1C
Hgb A1c MFr Bld: 6.6 % — ABNORMAL HIGH (ref <5.7)
Mean Plasma Glucose: 143 mg/dL — ABNORMAL HIGH (ref <117)

## 2010-05-08 LAB — URINALYSIS, ROUTINE W REFLEX MICROSCOPIC
Bilirubin Urine: NEGATIVE
Glucose, UA: NEGATIVE mg/dL
Hgb urine dipstick: NEGATIVE
Ketones, ur: NEGATIVE mg/dL
pH: 5.5 (ref 5.0–8.0)

## 2010-05-08 LAB — MAGNESIUM
Magnesium: 2.3 mg/dL (ref 1.5–2.5)
Magnesium: 2.7 mg/dL — ABNORMAL HIGH (ref 1.5–2.5)
Magnesium: 3.6 mg/dL — ABNORMAL HIGH (ref 1.5–2.5)

## 2010-05-08 LAB — POCT I-STAT 4, (NA,K, GLUC, HGB,HCT)
Glucose, Bld: 77 mg/dL (ref 70–99)
Glucose, Bld: 98 mg/dL (ref 70–99)
HCT: 26 % — ABNORMAL LOW (ref 36.0–46.0)
HCT: 36 % (ref 36.0–46.0)
Hemoglobin: 12.2 g/dL (ref 12.0–15.0)
Hemoglobin: 8.8 g/dL — ABNORMAL LOW (ref 12.0–15.0)
Potassium: 3.4 mEq/L — ABNORMAL LOW (ref 3.5–5.1)
Potassium: 3.8 mEq/L (ref 3.5–5.1)
Potassium: 5.1 mEq/L (ref 3.5–5.1)
Sodium: 135 mEq/L (ref 135–145)
Sodium: 136 mEq/L (ref 135–145)
Sodium: 136 mEq/L (ref 135–145)

## 2010-05-08 LAB — COMPREHENSIVE METABOLIC PANEL
ALT: 20 U/L (ref 0–35)
AST: 20 U/L (ref 0–37)
Albumin: 3.4 g/dL — ABNORMAL LOW (ref 3.5–5.2)
CO2: 30 mEq/L (ref 19–32)
Chloride: 98 mEq/L (ref 96–112)
Creatinine, Ser: 0.72 mg/dL (ref 0.4–1.2)
GFR calc Af Amer: >60 mL/min (ref >60)
GFR calc non Af Amer: >60 mL/min (ref >60)
Potassium: 5.1 mEq/L (ref 3.5–5.1)
Sodium: 134 mEq/L — ABNORMAL LOW (ref 135–145)
Total Bilirubin: 0.9 mg/dL (ref 0.3–1.2)

## 2010-05-08 LAB — PLATELET COUNT: Platelets: 115 10*3/uL — ABNORMAL LOW (ref 150–400)

## 2010-05-08 LAB — PROTIME-INR
INR: 1.35 (ref 0.00–1.49)
Prothrombin Time: 16.9 seconds — ABNORMAL HIGH (ref 11.6–15.2)

## 2010-05-08 LAB — PLATELET FUNCTION ASSAY: Collagen / Epinephrine: 140 seconds (ref 0–184)

## 2010-05-08 LAB — MRSA PCR SCREENING: MRSA by PCR: NEGATIVE

## 2010-05-08 LAB — APTT: aPTT: 33 seconds (ref 24–37)

## 2010-05-08 LAB — LIPID PANEL
Triglycerides: 207 mg/dL — ABNORMAL HIGH (ref <150)
VLDL: 41 mg/dL — ABNORMAL HIGH (ref 0–40)

## 2010-05-08 LAB — HEMOGLOBIN AND HEMATOCRIT, BLOOD
HCT: 22.6 % — ABNORMAL LOW (ref 36.0–46.0)
Hemoglobin: 7.7 g/dL — ABNORMAL LOW (ref 12.0–15.0)

## 2010-05-09 ENCOUNTER — Encounter (HOSPITAL_COMMUNITY): Payer: Medicare Other

## 2010-05-10 ENCOUNTER — Encounter: Payer: Self-pay | Admitting: Internal Medicine

## 2010-05-10 ENCOUNTER — Ambulatory Visit (INDEPENDENT_AMBULATORY_CARE_PROVIDER_SITE_OTHER): Payer: Medicare Other

## 2010-05-10 DIAGNOSIS — E785 Hyperlipidemia, unspecified: Secondary | ICD-10-CM

## 2010-05-10 DIAGNOSIS — Z79899 Other long term (current) drug therapy: Secondary | ICD-10-CM

## 2010-05-11 ENCOUNTER — Encounter (HOSPITAL_COMMUNITY): Payer: Medicare Other

## 2010-05-11 ENCOUNTER — Telehealth (INDEPENDENT_AMBULATORY_CARE_PROVIDER_SITE_OTHER): Payer: Self-pay | Admitting: *Deleted

## 2010-05-14 ENCOUNTER — Encounter (HOSPITAL_COMMUNITY): Payer: Medicare Other

## 2010-05-15 NOTE — Letter (Signed)
Summary: [facility]  [facility]   Imported By: Sherian Rein 05/10/2010 10:09:01  _____________________________________________________________________  External Attachment:    Type:   Image     Comment:   External Document

## 2010-05-15 NOTE — Progress Notes (Signed)
Summary: headache feels like band around her head--rx sent  Phone Note Call from Patient Call back at Home Phone [phone]   Caller: Patient Call For: NADEL Summary of Call: Patient phoned stated that she has a headache it feels like a band around the top and bottom of her head. She stated that it started on Thursday and she wants to be seen. She feels weak and trembly inside doesnt have much of an appitite and she just doesnt feel good. Patient can be reached at [phone] Initial call taken by: Vedia Coffer,  May 11, 2010 2:16 PM  Follow-up for Phone Call        Called and spoke with pt and she c/o having a severe headache, feels pressure across her forehead, nauseas, trembly and weak. Pt states she ahs been alternating heat and ice to her forehead and the back of her neck. pt also has taken half of hydrocodene but it has only helped a little bit. pt wants recs on what to do as well as pt requests to see SN. I offered pt apt with TP but states she would rather see SN if possible. Dr. [name] please advise. thanks Allergies:  1)  ! Penicillin 2)  ! Codeine 3)  ! Simvastatin (Simvastatin) 4)  ! Trilipix (Choline Fenofibrate) 5)  ! Lisinopril (Lisinopril) Mindy Silva  May 11, 2010 3:52 PM   Additional Follow-up for Phone Call Additional follow up Details #1::        per SN---headaches sound like muscle contraction type-----recs are for rest, heat/ice ok and hydrocodone  is best to use----1 full tablet by mouth every 6 hours as needed  and take the muscle relaxer  cyclobenazeprine 10mg     1 by mouth three times a day #50  with 1 refills....thanks Randell Loop CMA  May 11, 2010 4:30 PM     Additional Follow-up for Phone Call Additional follow up Details #2::    Called and spoke with pt and informed her of sn recs. she verbalized understanding and also informed her rx was sent Carver Fila  May 11, 2010 4:37 PM   New/Updated Medications: CYCLOBENZAPRINE HCL 10 MG TABS  (CYCLOBENZAPRINE HCL) 1 tablet by mouth three times a day Prescriptions: CYCLOBENZAPRINE HCL 10 MG TABS (CYCLOBENZAPRINE HCL) 1 tablet by mouth three times a day  #50 x 1   Entered by:   Carver Fila   Authorized by:   Michele Mcalpine MD   Signed by:   Carver Fila on 05/11/2010   Method used:   Electronically to        [facility]  [address] [id number]* (retail)       [address]       [place], [place]  [address]       Ph: [phone] or [phone]       Fax: [phone]   RxID:   [id number]

## 2010-05-16 ENCOUNTER — Encounter (HOSPITAL_COMMUNITY): Payer: Medicare Other

## 2010-05-16 LAB — COMPREHENSIVE METABOLIC PANEL
Albumin: 3.9 g/dL (ref 3.5–5.2)
Alkaline Phosphatase: 66 U/L (ref 39–117)
BUN: 13 mg/dL (ref 6–23)
CO2: 29 mEq/L (ref 19–32)
Chloride: 95 mEq/L — ABNORMAL LOW (ref 96–112)
Creatinine, Ser: 0.76 mg/dL (ref 0.4–1.2)
GFR calc non Af Amer: >60 mL/min (ref >60)
Potassium: 4.5 mEq/L (ref 3.5–5.1)
Total Bilirubin: 0.8 mg/dL (ref 0.3–1.2)

## 2010-05-16 LAB — DIFFERENTIAL
Basophils Absolute: 0.1 10*3/uL (ref 0.0–0.1)
Basophils Relative: 1 % (ref 0–1)
Eosinophils Relative: 2 % (ref 0–5)
Monocytes Absolute: 0.6 10*3/uL (ref 0.1–1.0)
Neutro Abs: 3.4 10*3/uL (ref 1.7–7.7)

## 2010-05-16 LAB — URINALYSIS, ROUTINE W REFLEX MICROSCOPIC
Bilirubin Urine: NEGATIVE
Hgb urine dipstick: NEGATIVE
Nitrite: NEGATIVE
Protein, ur: NEGATIVE mg/dL
Urobilinogen, UA: 0.2 mg/dL (ref 0.0–1.0)

## 2010-05-16 LAB — POCT CARDIAC MARKERS
Myoglobin, poc: 49.8 ng/mL (ref 12–200)
Troponin i, poc: <0.05 ng/mL (ref 0.00–0.09)

## 2010-05-16 LAB — LIPASE, BLOOD: Lipase: 21 U/L (ref 11–59)

## 2010-05-16 LAB — CBC
HCT: 40.8 % (ref 36.0–46.0)
Hemoglobin: 14 g/dL (ref 12.0–15.0)
MCV: 92.1 fL (ref 78.0–100.0)
Platelets: 256 10*3/uL (ref 150–400)
RBC: 4.42 MIL/uL (ref 3.87–5.11)
WBC: 9.3 10*3/uL (ref 4.0–10.5)

## 2010-05-18 ENCOUNTER — Encounter (HOSPITAL_COMMUNITY): Payer: Medicare Other

## 2010-05-21 ENCOUNTER — Encounter (HOSPITAL_COMMUNITY): Payer: Medicare Other

## 2010-05-22 ENCOUNTER — Encounter: Payer: Self-pay | Admitting: Cardiovascular Disease

## 2010-05-22 ENCOUNTER — Ambulatory Visit (INDEPENDENT_AMBULATORY_CARE_PROVIDER_SITE_OTHER): Payer: Medicare Other | Admitting: Cardiovascular Disease

## 2010-05-22 DIAGNOSIS — I2510 Atherosclerotic heart disease of native coronary artery without angina pectoris: Secondary | ICD-10-CM

## 2010-05-22 DIAGNOSIS — E785 Hyperlipidemia, unspecified: Secondary | ICD-10-CM

## 2010-05-22 DIAGNOSIS — I10 Essential (primary) hypertension: Secondary | ICD-10-CM

## 2010-05-22 NOTE — Patient Instructions (Signed)
Your physician recommends that you schedule a follow-up appointment in: 6 months  

## 2010-05-22 NOTE — Assessment & Plan Note (Signed)
Well controlled.  Continue current medications and low sodium Dash type diet.    

## 2010-05-22 NOTE — Progress Notes (Signed)
[name] is seen today post F/U for CAD with instent restenosis and recent CABG with Dr Tyrone Sage 01/19/10 .  Post op course unremarkable.  CRF;s include HTN and elevated lipids.  Rx limited by side effects.  Cough with ACE and myalgias with higher dose statins.  She still has issues with gallstones.  Prior to her CABG she had documented stones by [place] and has seen Dr Jarold Motto and Andrey Campanile.  Recent headaches and bronchitis followed by Dr [name].  No chest pain Sternum well healed.  Enjoys cardiac rehab.  Recent clinic visit with good labs and tolerating lower dose statin  ROS: Denies fever, malais, weight loss, blurry vision, decreased visual acuity, cough, sputum, SOB, hemoptysis, pleuritic pain, palpitaitons, heartburn, abdominal pain, melena, lower extremity edema, claudication, or rash.   General: Affect appropriate Healthy:  appears stated age HEENT: normal Neck supple with no adenopathy JVP normal no bruits no thyromegaly Lungs clear with no wheezing and good diaphragmatic motion Heart:  S1/S2 no murmur,rub, gallop or click PMI normal Abdomen: benighn, BS positve, no tenderness, no AAA no bruit.  No HSM or HJR Distal pulses intact with no bruits No edema Neuro non-focal Skin warm and dry No muscular weakness   Current Outpatient Prescriptions  Medication Sig Dispense Refill  . aspirin 81 MG tablet Take 81 mg by mouth daily.        . hyoscyamine (ANASPAZ) 0.125 MG TBDP Place 0.125 mg under the tongue as needed.       . loratadine (ALAVERT) 10 MG tablet 1 tab po prn       . losartan-hydrochlorothiazide (HYZAAR) 100-12.5 MG per tablet Take 1 tablet by mouth daily.        . metoprolol (LOPRESSOR) 50 MG tablet Take 50 mg by mouth 2 (two) times daily.        . mometasone (NASONEX) 50 MCG/ACT nasal spray 2 sprays by Nasal route daily.        . Omega-3 Fatty Acids (FISH OIL) 1200 MG CAPS 2 tabs po bid       . omeprazole (PRILOSEC) 40 MG capsule Take 40 mg by mouth daily.        .  rosuvastatin (CRESTOR) 10 MG tablet 1 tab po 2 days a week      . traMADol (ULTRAM) 50 MG tablet Take 50 mg by mouth every 6 (six) hours as needed.        . travoprost, benzalkonium, (TRAVATAN) 0.004 % ophthalmic solution 1 drop at bedtime.        . vitamin B-12 (CYANOCOBALAMIN) 100 MCG tablet 1 tab po qd       . Vitamin D, Ergocalciferol, (DRISDOL) 50000 UNITS CAPS Take by mouth. weekly       . DISCONTD: budesonide-formoterol (SYMBICORT) 80-4.5 MCG/ACT inhaler Inhale 2 puffs into the lungs 2 (two) times daily.        [name] DISCONTD: dextromethorphan-guaiFENesin (MUCINEX DM) 30-600 MG per 12 hr tablet prn         Allergies  Clarithromycin; Codeine; Lisinopril; Penicillins; Simvastatin; and Trilipix   Assessment and Plan

## 2010-05-22 NOTE — Assessment & Plan Note (Signed)
At target with no side effects Lab Results  Component Value Date   [facility]  Value: 95        Total Cholesterol/HDL:CHD Risk Coronary Heart Disease Risk Table                     Men   Women  1/2 Average Risk   3.4   3.3  Average Risk       5.0   4.4  2 X Average Risk   9.6   7.1  3 X Average Risk  23.4   11.0        Use the calculated Patient Ratio above and the CHD Risk Table to determine the patient's CHD Risk.        ATP III CLASSIFICATION (LDL):  <100     mg/dL   Optimal  147-829  mg/dL   Near or Above                    Optimal  130-159  mg/dL   Borderline  562-130  mg/dL   High  >865     mg/dL   Very High 78/46/9629

## 2010-05-22 NOTE — Assessment & Plan Note (Signed)
S/P CAGB 11/11.  [place] LAD, SVG D1, and SVG OM1.  No angina.  Normal EF

## 2010-05-23 ENCOUNTER — Encounter (HOSPITAL_COMMUNITY): Payer: Medicare Other

## 2010-05-24 NOTE — Assessment & Plan Note (Signed)
Summary: from checkout 10/25/mhh   Primary Care Provider:  Alroy Dust, MD   CC:  5 month ROV & review of mult problems....  History of Present Illness: 75 y/o WF here for a follow up visit... she has multiple medical problems including AR & Asthma;  HBP;  CAD followed by Walker Kehr & s/p 3 vessel CABG 11/11 by DrGearhardt;  Hyperlipidemia followed in the [facility];  GERD/ IBS/ Divertics;  Fibromyalgia;  Chr HAs & dizziness...   ~  May 08, 2010:  [name] has had a busy 64mo> epis of CP, cath w/ in-stent restenosis, then CABG x3 11/11 by DrGearhardt... now c/o refractory URI x 6wks w/ sinusitis/ bronchitis/ laryngitis & rx w/ ZPak, Biaxin, etc from DrESL "I'm allergic" she says... finally received Avelox/ Medrol & improved...      AR/ Asthma>  DrESL stopped her Symbicort in favor of QVar...     HBP>  controlled on Toprol & Hyzaar w/ BP 130/70 today... denies visual changes, CP, palipit, dizziness, syncope, dyspnea, edema, etc...      CAD>  s/p CABG as noted & saw both DrNishan & DrGearhardt 12/11 (notes reviewed)...     Hyperlipid>  followed in the [facility] & FLP 2/12 reviewed on Cres10 2d per week...    Others>  she has persistant FM symptoms and chr HAs, uses Vicodin Prn...     SEE [facility] EMR notes for Problem list details >>>    Preventive Screening-Counseling & Management  Alcohol-Tobacco     Alcohol drinks/day: 0     Smoking Status: never  Caffeine-Diet-Exercise     Caffeine use/day: 1 cup coffee / day     Does Patient Exercise: yes     Type of exercise: Walking     Exercise (avg: min/session): 30-60     Times/week: 4  Allergies: 1)  ! Penicillin 2)  ! Codeine 3)  ! Simvastatin (Simvastatin) 4)  ! Trilipix (Choline Fenofibrate) 5)  ! Lisinopril (Lisinopril) 6)  ! Biaxin  Comments:  Nurse/Medical Assistant: The patient's medications and allergies were reviewed with the patient and were updated in the Medication and Allergy Lists.  Past History:  Past Medical  History: GLAUCOMA (ICD-365.9) ALLERGIC RHINITIS (ICD-477.9) ASTHMA (ICD-493.90) HYPERTENSION (ICD-401.9) CORONARY ARTERY DISEASE (ICD-414.00) HYPERLIPIDEMIA (ICD-272.4) HIATAL HERNIA (ICD-553.3) GERD (ICD-530.81) GASTRITIS (ICD-535.50) DIVERTICULOSIS, COLON (ICD-562.10) IRRITABLE BOWEL SYNDROME (ICD-564.1) COLONIC POLYPS, HX OF (ICD-V12.72) CHOLELITHIASIS (ICD-574.20) FIBROMYALGIA (ICD-729.1) HEADACHE (ICD-784.0) DIZZINESS, CHRONIC (ICD-780.4)  Past Surgical History: PTCA-Stent 2007 S/P 3 vessel CABG 11/11 by DrGearhardt.  Family History: Reviewed history from 01/01/2008 and no changes required. Family History of Breast Cancer:Sister Family History of Uterine Cancer:Sister Thyroid Cancer: Brother Family History of Prostate Cancer:Father Family History of Diabetes: Sister, brother  Social History: Reviewed history from 12/17/2007 and no changes required. Patient has never smoked.  Alcohol Use - no Daily Caffeine Use-1 Illicit Drug Use - no  Review of Systems      See HPI       The patient complains of hoarseness, dyspnea on exertion, prolonged cough, muscle weakness, and difficulty walking.  The patient denies anorexia, fever, weight loss, weight gain, vision loss, decreased hearing, chest pain, syncope, peripheral edema, headaches, hemoptysis, abdominal pain, melena, hematochezia, severe indigestion/heartburn, hematuria, incontinence, suspicious skin lesions, transient blindness, depression, unusual weight change, abnormal bleeding, enlarged lymph nodes, and angioedema.    Vital Signs:  Patient profile:   75 year old female Height:      59 inches Weight:      132.50 pounds O2  Sat:      95 % on Room air Temp:     97.9 degrees F oral Pulse rate:   60 / minute BP sitting:   130 / 70  (right arm) Cuff size:   regular  Vitals Entered By: Randell Loop CMA (May 08, 2010 3:17 PM)  O2 Sat at Rest %:  95 O2 Flow:  Room air CC: 5 month ROV & review of mult  problems... Is Patient Diabetic? No Pain Assessment Patient in pain? no      Comments meds updated today with pt   Physical Exam  Additional Exam:  WD, WN, 75 y/o WF in NAD... GENERAL:  Alert & oriented; pleasant & cooperative... HEENT:  [place]/AT, EOM-wnl, PERRLA, EACs-clear, TMs-wnl, NOSE-clear, THROAT-clear & wnl. NECK:  Supple w/ fair ROM; no JVD; normal carotid impulses w/o bruits; no thyromegaly or nodules palpated; no lymphadenopathy. CHEST:  Clear to P & A; without wheezes/ rales/ or rhonchi heard... chest wall is tender & reproduces her pain... HEART:  Regular Rhythm; without murmurs/ rubs/ or gallops detected... ABDOMEN:  Soft & nontender; normal bowel sounds; no organomegaly or masses palpated... EXT: without deformities, mild arthritic changes and +trigger points, no varicose veins/ venous insuffic/ or edema. NEURO:  CN's intact; motor testing normal; sensory testing normal; gait normal & balance OK. DERM:  No lesions noted; no rash etc...    Impression & Recommendations:  Problem # 1:  ASTHMA (ICD-493.90) She had URI exac w/ several rounds of antibiotics by DrESL... finally better after Avelox & Medrol... ?DrESL cahnged Symbicort to QVar? The following medications were removed from the medication list:    Symbicort 80-4.5 Mcg/act Aero (Budesonide-formoterol fumarate) .[name]... 2 inhalations two times a day  Problem # 2:  HYPERTENSION (ICD-401.9) Controlled on meds>  continue same. Her updated medication list for this problem includes:    Metoprolol Tartrate 50 Mg Tabs (Metoprolol tartrate) .[name]... Take one tablet by mouth two times a day    Losartan Potassium-hctz 100-12.5 Mg Tabs (Losartan potassium-hctz) .[name]... Take 1 tablet by mouth once a day  Problem # 3:  CORONARY ARTERY DISEASE (ICD-414.00) S/p CABG & followed by DrGearhardt & Walker Kehr... Her updated medication list for this problem includes:    Aspirin 81 Mg Tbec (Aspirin) .[name]... Take 1 tablet by mouth once a day     Metoprolol Tartrate 50 Mg Tabs (Metoprolol tartrate) .[name]... Take one tablet by mouth two times a day    Losartan Potassium-hctz 100-12.5 Mg Tabs (Losartan potassium-hctz) .[name]... Take 1 tablet by mouth once a day  Problem # 4:  HYPERLIPIDEMIA (ICD-272.4) Followed in the [facility] on Cres10 twice per week... Her updated medication list for this problem includes:    Crestor 10 Mg Tabs (Rosuvastatin calcium) .[name]... Take 1 tablet by mouth two days per week.  Problem # 5:  CHOLELITHIASIS (ICD-574.20) GI is stable>  Dr Eden Emms has cleared [name] for lap chole if [place]...  Problem # 6:  FIBROMYALGIA (ICD-729.1) She persists w/ FM & Ha symptoms on & off... Her updated medication list for this problem includes:    Aspirin 81 Mg Tbec (Aspirin) .[name]... Take 1 tablet by mouth once a day    Tramadol Hcl 50 Mg Tabs (Tramadol hcl) .[name]... 1 tablet daily as needed for pain    Cyclobenzaprine Hcl 10 Mg Tabs (Cyclobenzaprine hcl) .[name]... 1 tablet by mouth three times a day  Complete Medication List: 1)  Travatan 0.004 % Soln (Travoprost) .... Use one drop in each eye at bedtime  2)  Alavert 10 Mg Tabs (Loratadine) .... Take 1 tablet by mouth once a day as needed 3)  Nasonex 50 Mcg/act Susp (Mometasone furoate) .[name].. 1-2 puffs two times a day 4)  Aspirin 81 Mg Tbec (Aspirin) .... Take 1 tablet by mouth once a day 5)  Metoprolol Tartrate 50 Mg Tabs (Metoprolol tartrate) .... Take one tablet by mouth two times a day 6)  Losartan Potassium-hctz 100-12.5 Mg Tabs (Losartan potassium-hctz) .... Take 1 tablet by mouth once a day 7)  Crestor 10 Mg Tabs (Rosuvastatin calcium) .... Take 1 tablet by mouth two days per week. 8)  Fish Oil Maximum Strength 1200 Mg Caps (Omega-3 fatty acids) .... Take 2 caps by mouth twice daily. 9)  Omeprazole 40 Mg Cpdr (Omeprazole) .... Take 1 cap by mouth once daily- 30 min before the 1st meal of the day. 10)  Hyoscyamine Sulfate 0.125 Mg Tabs (Hyoscyamine sulfate) .[name].. 1-2 by mouth q 4 hours as needed  esophageal spasms 11)  Vitamin B-12 100 Mcg Tabs (Cyanocobalamin) .[name].. 1 tab by mouth once daily 12)  Vitamin D (ergocalciferol) 50000 Unit Caps (Ergocalciferol) .... Weekly 13)  Tramadol Hcl 50 Mg Tabs (Tramadol hcl) .[name].. 1 tablet daily as needed for pain 14)  Cyclobenzaprine Hcl 10 Mg Tabs (Cyclobenzaprine hcl) .[name].. 1 tablet by mouth three times a day  Patient Instructions: 1)  Today we updated your med list- see below.... 2)  Continue your current medications the same... 3)  Call for any problems.Marland KitchenMarland Kitchen 4)  Let's plan a follow up visit in 4-6 months & we will plan follow up blood work at that visit.Marland KitchenMarland Kitchen

## 2010-05-24 NOTE — Assessment & Plan Note (Signed)
Summary: ROV/SP   Referring Provider:  Eden Emms Primary Provider:  Alroy Dust, MD    History of Present Illness: The patient comes in today for dyslipidemia follow-up.  The patient had open hear surgery last November and has been doing great since then. She is currently taking Crestor 10mg  twice a week and fish oil 4 gm daily.  She has some minor aches with Crestor, but she states she is able to tolerate this with no issues.   Since last visit, pt has been trying to eat healthy and stay away from all red meat.  She eats baked chicken and broiled fish.  Her vegetables usually include peas, green beans, potatoes, or greens.   She is enrolled in the cardiac rehab program now, and excercise regularly. But she said she hasn't been walking much or excercise much in addition to the cardiac rehab sessions.   Current Medications (verified): 1)  Travatan 0.004 % Soln (Travoprost) .... Use One Drop in Each Eye At Bedtime 2)  Alavert 10 Mg  Tabs (Loratadine) .... Take 1 Tablet By Mouth Once A Day As Needed 3)  Nasonex 50 Mcg/act  Susp (Mometasone Furoate) .[name].. 1-2 Puffs Two Times A Day 4)  Aspirin 81 Mg Tbec (Aspirin) .... Take 1 Tablet By Mouth Once A Day 5)  Metoprolol Tartrate 50 Mg Tabs (Metoprolol Tartrate) .... Take One Tablet By Mouth Two Times A Day 6)  Losartan Potassium-Hctz 100-12.5 Mg Tabs (Losartan Potassium-Hctz) .... Take 1 Tablet By Mouth Once A Day 7)  Crestor 10 Mg Tabs (Rosuvastatin Calcium) .... Take 1 Tablet By Mouth Two Days Per Week. 8)  Fish Oil Maximum Strength 1200 Mg Caps (Omega-3 Fatty Acids) .... Take 2 Caps By Mouth Twice Daily. 9)  Omeprazole 40 Mg Cpdr (Omeprazole) .... Take 1 Cap By Mouth Once Daily- 30 Min Before The 1st Meal of The Day. 10)  Hyoscyamine Sulfate 0.125 Mg  Tabs (Hyoscyamine Sulfate) .[name].. 1-2 By Mouth Q 4 Hours As Needed Esophageal Spasms 11)  Vitamin B-12 100 Mcg Tabs (Cyanocobalamin) .[name].. 1 Tab By Mouth Once Daily 12)  Vitamin D (Ergocalciferol) 50000  Unit Caps (Ergocalciferol) .... Weekly 13)  Tramadol Hcl 50 Mg Tabs (Tramadol Hcl) .[name].. 1 Tablet Daily As Needed For Pain  Allergies: 1)  ! Penicillin 2)  ! Codeine 3)  ! Simvastatin (Simvastatin) 4)  ! Trilipix (Choline Fenofibrate) 5)  ! Lisinopril (Lisinopril) 6)  ! Biaxin  Vital Signs:  Patient profile:   75 year old female Weight:      132 pounds Pulse rate:   68 / minute BP sitting:   136 / 70  (right arm)   Impression & Recommendations:  Problem # 1:  HYPERLIPIDEMIA (ICD-272.4) Assessment Improved [name] has been making good progress. Her TG decreased to 99 from 198 at the previous visit. Her HDL is increased to 76. Her LDL is 118 which is still above goal and remains the same from 4 months ago. She could benefit from increase statin dosage, however it is not a option for her due to her statin intolerance. She still has some leg pain, but it is tolerable right now. Congratulated the her progress. Recommend to continue current drug therapy, and cut down fat intake as much as possible. In crease excercise as tolerated. Follow up visit in 5 months Her updated medication list for this problem includes:    Crestor 10 Mg Tabs (Rosuvastatin calcium) .[name]... Take 1 tablet by mouth two days per week.  fish oil 1200mg  Caps .... Take 2 caps my mouth twice daily.  Patient Instructions: 1)  Continue current medication 2)  Report increased muscle pain. 3)  Continue low fat diet, increase vegetable,  4)  Continue cardiac rehab, and walk 3 times a week.

## 2010-05-25 ENCOUNTER — Encounter (HOSPITAL_COMMUNITY): Payer: Medicare Other

## 2010-05-25 ENCOUNTER — Other Ambulatory Visit: Payer: Self-pay | Admitting: Cardiovascular Disease

## 2010-05-28 ENCOUNTER — Encounter (HOSPITAL_COMMUNITY): Payer: Medicare Other | Attending: Cardiovascular Disease

## 2010-05-28 DIAGNOSIS — Z951 Presence of aortocoronary bypass graft: Secondary | ICD-10-CM | POA: Insufficient documentation

## 2010-05-28 DIAGNOSIS — I2582 Chronic total occlusion of coronary artery: Secondary | ICD-10-CM | POA: Insufficient documentation

## 2010-05-28 DIAGNOSIS — Z79899 Other long term (current) drug therapy: Secondary | ICD-10-CM | POA: Insufficient documentation

## 2010-05-28 DIAGNOSIS — K589 Irritable bowel syndrome without diarrhea: Secondary | ICD-10-CM | POA: Insufficient documentation

## 2010-05-28 DIAGNOSIS — Z9861 Coronary angioplasty status: Secondary | ICD-10-CM | POA: Insufficient documentation

## 2010-05-28 DIAGNOSIS — K2100 Gastro-esophageal reflux disease with esophagitis, without bleeding: Secondary | ICD-10-CM | POA: Insufficient documentation

## 2010-05-28 DIAGNOSIS — Z5189 Encounter for other specified aftercare: Secondary | ICD-10-CM | POA: Insufficient documentation

## 2010-05-28 DIAGNOSIS — Z8249 Family history of ischemic heart disease and other diseases of the circulatory system: Secondary | ICD-10-CM | POA: Insufficient documentation

## 2010-05-28 DIAGNOSIS — I10 Essential (primary) hypertension: Secondary | ICD-10-CM | POA: Insufficient documentation

## 2010-05-28 DIAGNOSIS — E785 Hyperlipidemia, unspecified: Secondary | ICD-10-CM | POA: Insufficient documentation

## 2010-05-28 DIAGNOSIS — I2510 Atherosclerotic heart disease of native coronary artery without angina pectoris: Secondary | ICD-10-CM | POA: Insufficient documentation

## 2010-05-28 DIAGNOSIS — I200 Unstable angina: Secondary | ICD-10-CM | POA: Insufficient documentation

## 2010-05-28 DIAGNOSIS — J45909 Unspecified asthma, uncomplicated: Secondary | ICD-10-CM | POA: Insufficient documentation

## 2010-05-28 DIAGNOSIS — Z880 Allergy status to penicillin: Secondary | ICD-10-CM | POA: Insufficient documentation

## 2010-05-28 DIAGNOSIS — Z7982 Long term (current) use of aspirin: Secondary | ICD-10-CM | POA: Insufficient documentation

## 2010-05-30 ENCOUNTER — Encounter (HOSPITAL_COMMUNITY): Payer: Medicare Other

## 2010-06-01 ENCOUNTER — Encounter (HOSPITAL_COMMUNITY): Payer: Medicare Other

## 2010-06-04 ENCOUNTER — Encounter (HOSPITAL_COMMUNITY): Payer: Medicare Other

## 2010-06-06 ENCOUNTER — Encounter (HOSPITAL_COMMUNITY): Payer: Medicare Other

## 2010-06-08 ENCOUNTER — Encounter (HOSPITAL_COMMUNITY): Payer: Medicare Other

## 2010-06-11 ENCOUNTER — Encounter (HOSPITAL_COMMUNITY): Payer: Medicare Other

## 2010-06-13 ENCOUNTER — Encounter (HOSPITAL_COMMUNITY): Payer: Medicare Other

## 2010-06-15 ENCOUNTER — Encounter (HOSPITAL_COMMUNITY): Payer: Medicare Other

## 2010-06-18 ENCOUNTER — Encounter (HOSPITAL_COMMUNITY): Payer: Medicare Other

## 2010-06-20 ENCOUNTER — Encounter (HOSPITAL_COMMUNITY): Payer: Medicare Other

## 2010-06-22 ENCOUNTER — Encounter (HOSPITAL_COMMUNITY): Payer: Medicare Other

## 2010-06-25 ENCOUNTER — Encounter (HOSPITAL_COMMUNITY): Payer: Medicare Other

## 2010-06-27 ENCOUNTER — Encounter (HOSPITAL_COMMUNITY): Payer: Medicare Other | Attending: Cardiovascular Disease

## 2010-06-27 DIAGNOSIS — Z8249 Family history of ischemic heart disease and other diseases of the circulatory system: Secondary | ICD-10-CM | POA: Insufficient documentation

## 2010-06-27 DIAGNOSIS — I2510 Atherosclerotic heart disease of native coronary artery without angina pectoris: Secondary | ICD-10-CM | POA: Insufficient documentation

## 2010-06-27 DIAGNOSIS — Z7982 Long term (current) use of aspirin: Secondary | ICD-10-CM | POA: Insufficient documentation

## 2010-06-27 DIAGNOSIS — Z9861 Coronary angioplasty status: Secondary | ICD-10-CM | POA: Insufficient documentation

## 2010-06-27 DIAGNOSIS — Z880 Allergy status to penicillin: Secondary | ICD-10-CM | POA: Insufficient documentation

## 2010-06-27 DIAGNOSIS — I10 Essential (primary) hypertension: Secondary | ICD-10-CM | POA: Insufficient documentation

## 2010-06-27 DIAGNOSIS — K589 Irritable bowel syndrome without diarrhea: Secondary | ICD-10-CM | POA: Insufficient documentation

## 2010-06-27 DIAGNOSIS — K2100 Gastro-esophageal reflux disease with esophagitis, without bleeding: Secondary | ICD-10-CM | POA: Insufficient documentation

## 2010-06-27 DIAGNOSIS — Z79899 Other long term (current) drug therapy: Secondary | ICD-10-CM | POA: Insufficient documentation

## 2010-06-27 DIAGNOSIS — J45909 Unspecified asthma, uncomplicated: Secondary | ICD-10-CM | POA: Insufficient documentation

## 2010-06-27 DIAGNOSIS — Z951 Presence of aortocoronary bypass graft: Secondary | ICD-10-CM | POA: Insufficient documentation

## 2010-06-27 DIAGNOSIS — Z5189 Encounter for other specified aftercare: Secondary | ICD-10-CM | POA: Insufficient documentation

## 2010-06-27 DIAGNOSIS — E785 Hyperlipidemia, unspecified: Secondary | ICD-10-CM | POA: Insufficient documentation

## 2010-06-27 DIAGNOSIS — I200 Unstable angina: Secondary | ICD-10-CM | POA: Insufficient documentation

## 2010-06-27 DIAGNOSIS — I2582 Chronic total occlusion of coronary artery: Secondary | ICD-10-CM | POA: Insufficient documentation

## 2010-06-29 ENCOUNTER — Encounter (HOSPITAL_COMMUNITY): Payer: Medicare Other

## 2010-07-10 NOTE — Assessment & Plan Note (Signed)
[facility]                            CARDIOLOGY OFFICE NOTE   NAME:Wiederholt, CAYLAN CHENARD                   MRN:          [id number]  DATE:08/18/2006                            DOB:          Jul 09, 1935    Tija is here today in followup.  She has had drug-eluting stents in  the LAD.  They were patent by catheterization in 2007.  She continues to  have occasional atypical pains.  They are not exertional. She does not  take nitroglycerin for them.  They are fleeting.  There is no pleuritic  component to them.  There is no associated diaphoresis or shortness of  breath.   Her blood pressure has been a bit high lately.  She was recently started  back on Toprol and Micardis.  She seems to have elevated pressures in  the morning. I think she probably needs a bedtime dose of  antihypertensive.  She has not had any significant headaches, syncope,  palpitations.   The patient has been watching her diet.   REVIEW OF SYSTEMS:  Remarkable for some easy bruising.  She appears to  have a varicosity or a ecchymosis over a superficial vein in the right  arm.  Her Review of Systems is otherwise negative.   MEDICATIONS:  1. Nexium 40 a day.  2. Toprol 50.  3. An aspirin a day.  4. Zyrtec 10 a day.  5. Timolol eye drops.  6. Zetia 10 a day.  7. Plavix 75 a day.  8. Micardis 80 a day.   PHYSICAL EXAMINATION:  GENERAL:  She is an elderly white female in no  distress. Mood is appropriate.  VITAL SIGNS:  Blood pressure 150/72, pulse 74 and regular, respiratory  rate 14. Weight is 133.  HEENT:  Normal.  NECK:  There are no carotid bruits, no thyromegaly, no lymphadenopathy,  no JVP elevation.  LUNGS: Clear with no wheezing.  Good diaphragmatic motion.  HEART:  There is an S1, S2 with a soft systolic murmur.  PMI is normal.  ABDOMEN:  Benign.  Bowel sounds positive, no tenderness. No  hepatosplenomegaly, no hepatojugular reflux, no AAA, no bruits.  EXTREMITIES:  Femorals are +3 bilaterally.  PTs are +2 bilaterally.  There is no lower extremity edema.  NEUROLOGIC:  Nonfocal. There is no muscular weakness.  She does have a  small ecchymosis over a superficial vein in the right arm.   EKG is essentially normal today.   IMPRESSION:  1. History of coronary artery disease with previous stenting of the      left anterior descending.  No need for stress test.  We can      probably wait a year for this  Continue aspirin and beta blocker.  2. Risk factor modification.  For some reason, we do not have the      patient on a statin  drug.  I will have to see if she is      intolerant to it.  She will continue Niaspan.  We will check a      fasting lipid and liver profile in 6 months.  3. Hypertension, poorly controlled.  I increased Toprol to 50 in the      morning, 25 at night; increased Micardis to 80 in the morning and      40 at night.  She will continue a low-salt diet.  I will see her      back in about 8-10 weeks to reassess her blood pressure.     [name]. Eden Emms, MD, [facility]  Electronically Signed    PCN/MedQ  DD: 08/18/2006  DT: 08/18/2006  Job #: [id number]

## 2010-07-10 NOTE — Assessment & Plan Note (Signed)
[facility]                               LIPID CLINIC NOTE   NAME:[name], [name] IVIE                   MRN:          [id number]  DATE:01/11/2008                            DOB:          [birth year]    The patient is seen in Lipid Clinic for further evaluation of medication  titration associated her hyperlipidemia in the setting of documented  coronary disease equivalent.  She has been taking her Zetia 10 mg daily,  her niacin 500 mg daily with food, and two 81 mg aspirin 30 minutes  before her Niaspan.  She continues to follow low-fat, low-cholesterol  diet with oatmeal and cereal for breakfast, sandwiches, beans, fruits,  bananas, vegetables, potatoes, and some meat for lunch, and dinner some  grilled or baked meats, green tea.  She is working to select the leanest  beef products that she can and to substitute whole grain breads.  She is  a nonsmoker and nondrinker.  Med reviewed.  The patient had pain on  Trilipix as she also had with statins.  She tried lovastatin and  Crestor.  She has tried fish oil and flaxseed before.  She exercises by  walking each morning for 30-40 minutes daily in order to get her heart  rate up.   PHYSICAL EXAMINATION:  Weight today is 133 pounds, blood pressure is  122/68, her heart rate is 64.   LABORATORY DATA:  On December 31, 2007, revealed total cholesterol 162,  triglycerides 167, HDL 45.3, LDL of 83.  LFTs within normal limits.   ASSESSMENT:  The patient's numbers look good today and they are moving  in the right direction and her triglycerides remain slightly elevated.  She discontinued her fish oil when she ran out per her report.   Plan will be for the patient to restart her fish oil and increase this  towards 4 g daily.  She will look for brands that have USP label.  She  will continue to decrease her red meats and continue her physical  activity and exercise.  She will follow up with [place] in 4  months.      Shelby Dubin, PharmD, BCPS, CPP  Electronically Signed      Rollene Rotunda, MD, [facility]  Electronically Signed   MP/MedQ  DD: 03/21/2008  DT: 03/22/2008  Job #: [id number]

## 2010-07-10 NOTE — Assessment & Plan Note (Signed)
[facility]                               LIPID CLINIC NOTE   NAME:[name], [name]                   MRN:          [id number]  DATE:10/16/2006                            DOB:          1935/08/23    RETURN OFFICE VISIT FOR LIPID CLINIC   PAST MEDICAL HISTORY:  Hyperlipidemia, coronary artery disease status  post stent x2 on her LAD, gastroesophageal reflux disease, fibromyalgia,  diverticulosis, hypertension, glaucoma, irritable bowel disease.   MEDICATIONS:  1. Nexium 40 mg daily.  2. Toprol 50 mg daily.  3. Aspirin 81 mg daily.  4. Zyrtec 10 mg daily.  5. Vitamin B12 daily.  6. Vitamin C daily.  7. Calcium daily.  8. Atenolol eye drops daily.  9. Zetia 10 mg daily.  10.Niaspan 500 mg at bedtime.  11.Plavix 75 mg daily.  12.Micardis 80 mg in the morning and 40 in the evening.  13.Toprol 50 mg in the morning and 25 in the evening.   VITAL SIGNS:  Weight 135 pounds, blood pressure 128/86, heart rate 76.   No new lab data at this time.   ASSESSMENT:  [name] is returning today after restarting her Niaspan  since last visit.  She had self-discontinued her Niaspan because of  flushing and hot flashes she was having after she had decreased her  aspirin from 325 to 81 mg daily.  At last visit, we increased her  aspirin up to 162 mg in the evening with taking her Niaspan 500 mg after  that, just before going to bed.  She eats a small snack of peanut butter  and crackers once a day in the evening, prior to taking her aspirin and  Niaspan, and she is tolerating medications well.  She is walking 40  minutes a day and continues to eat lots of vegetables, low-fat, low-  carbohydrate meals, except for some low-fat ice cream that she eats  occasionally in the evening.   PLAN:  1. Continue current medication regimen.  2. Followup visit in 6 months for lipid panel and LFTs, and make      adjustments at that time.  3. Continue low-fat,  low-carbohydrate diet and continue exercise      regimen.      Leota Sauers, PharmD  Electronically Signed      Jesse Sans. Daleen Squibb, MD, [facility]  Electronically Signed   LC/MedQ  DD: 10/16/2006  DT: 10/17/2006  Job #: [id number]

## 2010-07-10 NOTE — Assessment & Plan Note (Signed)
[facility]                               LIPID CLINIC NOTE   NAME:Seese, LEVITA MONICAL                   MRN:          [id number]  DATE:04/09/2007                            DOB:          [birth year]    Mrs. Arambula returns to see [place] in lipid clinic for hyperlipidemia.  She  states that she has been doing well and she states that she is  tolerating the Niaspan somewhat better than previously.  She still  reports flushing three to four times a week.  She is following a low-fat  diet.  Breakfast usually consists of oatmeal, raisin bran or Cheerios.  Lunch, a sandwich with low-fat meat on whole-wheat bread, and with  dinner she states that she only has beef three times a week and then  chicken the other days and sometimes salmon.   On review of her diet, it is found that she has quite a bit of peanut  butter.  She states that she eats peanut butter crackers and peanut  butter on toast sometimes in the morning.  She also states that she eats  some almonds with dinner.   Walking:  She states she tries to walk 30-40 minutes four days a week;  she tries for five, but usually makes it for four.  She states that she  has been able to walk a little bit more this week, since it has been  such nice weather, but that when it is colder, she does not get out to  walk as much.   Mr. Tramel has started gardening and she states that she will start  gardening too, as the weather improves.   PAST MEDICAL HISTORY:  Significant for allergic rhinitis, asthma,  hypertension, coronary artery disease, status post LAD stent;  hyperlipidemia, GERD, IBS, fibromyalgia, headaches, dizziness, glaucoma  and diverticulosis.   MEDICATIONS INCLUDE:  1. Nexium 40 mg daily.  2. Zyrtec 10 mg daily.  3. Micardis 80 mg daily.  4. B12 one daily.  5. Vitamin C daily.  6. Timolol eye drops in both eyes daily.  7. Zetia 10 mg daily.  8. Niaspan 50 mg q.h.s.  9. Calcium 600 mg  daily.  10.Metoprolol 50 mg in the morning, 25 mg in the evening.  11.Aspirin 162 mg daily.  12.Plavix 75 mg daily.  13.Glucosamine three times daily.  14.Flax seed oil daily.  15.Fish oil daily.  16.Zinc daily.  17.Nasonex 50 mcg one to two puffs daily.   MEDICATION ALLERGIES:  She is allergic to PENICILLIN, to CODEINE, and  has multiple intolerances to statins as all have caused myalgias.   PHYSICAL EXAMINATION:  Weight is 131 pounds.  This is the same weight  she was back in July of 2008.  Blood pressure 126/64, heart rate of 68.   LABORATORY DATA:  Labs drawn at [facility] from March 16, 2007,  show total cholesterol of 222, triglycerides of 199, HDL of 46.2 and  direct LDL of 148.5 and LFTs were all within normal limits.   ASSESSMENT:  Mrs .Rummell is a very pleasant woman, whom I much enjoyed  seeing in lipid clinic.  Currently, she is not at goal with her current  therapy of Zetia, Niaspan and fish oil.  Total cholesterol is above goal  of 200, triglycerides are above goal of 150, HDL is at goal of greater  than 45; however, LDL is not at goal of less than 70 and it is 148.5.   I explained to Mrs. Maniaci that, especially given the setting that she  has had multiple stents placed, I would like her to be close to her LDL  goal than she is now and that the current therapy of Zetia, fish oil and  Niaspan is not enough.  Her case is further complicated due to  intolerances to all statins, she states.  On further questioning, I  asked her which statins she had taken.  She stated she had problems with  Lovastatin and problems with Crestor.  She states that she, at one  point, was on Crestor 5 mg and she seems to remember that she did okay  with that, but when they increased it to 10 mg, that is when she had the  myalgias.  I am hesitant to go up on her Niaspan, especially because she  complains of flushing three to four times a week.  Upon further  discussion, Mrs.  Stump agreed to start to try a low dose of statin and  to try it every other day.   PLAN:  We gave her samples today of Crestor 10 mg take one half tablet,  so 5 mg, every other day for two weeks and then increase to 5 mg daily.  I instructed her, if the myalgias come with taking the Crestor, that she  could reduce the Crestor down to 5 mg every other day.  Continue the  omega 3 fatty acids twice daily and the Zetia 10 mg daily.  I reinforced  the importance of taking Niaspan with a snack, preferably something that  has some protein in it, and additionally, to continue to take her  aspirin.  In terms of diet, I counseled with Mrs. Skilton to reduce her  consumption of peanut butter and to change one meal a week from beef to  chicken as a lower fat option.  I also told her that she could start  taking a handful of almonds or walnuts to help with her HDL and  triglycerides.  I encouraged her to continue exercising, especially with  the weather becoming nicer, 30 minutes most days of the week, and if she  could go 45 minutes, that would be great.   We will see Mrs. Heeren  in three months for a repeat lipid and liver  panel and also we will see her back in three months for a lipid clinic  followup.      Shelby Dubin, PharmD, BCPS, CPP  Electronically Signed      Rollene Rotunda, MD, [facility]  Electronically Signed   MP/MedQ  DD: 04/10/2007  DT: 04/10/2007  Job #: [id number]

## 2010-07-10 NOTE — Assessment & Plan Note (Signed)
OFFICE VISIT   [name], [name]  DOB:  [birth year]                                        February 15, 2010  CHART #:  [id number]   HISTORY:  The patient returns to the office today in followup after her  coronary artery bypass grafting x3 on January 19, 2010.  She returns to  the office today for followup visit.  [name]  at home.  She is walking up to 15 minutes at least twice a day.  [name] or evidence of congestive heart  failure.   PHYSICAL EXAMINATION:  Her blood pressure 142/74, pulse 74, respiratory  rate 16, and O2 sats 96%.  Her lungs are clear bilaterally.  Her sternum  is stable and healing well.  [name] has some  bruising, but the incisions are healing well.  [name].   MEDICATIONS:  She continues on medications including:  1. Losartan 50 mg a day.  2. Aspirin 81 mg a day.  3. Fish oil twice a day.  4. Metoprolol 50 b.i.d.  5. Nasonex 50 b.i.d.  6. Omeprazole 40 a day.  7. Symbicort 1 puff a day.   [name].  [name], she began having aching in her muscles and the dose was  decreased to twice a day and the symptoms have resolved.  [name] as long as she tolerates it.   [name].   IMPRESSION:  Overall, [name].  [name] next week.  [name] the next week.  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  [name] at least another month before she  returns to that level of activity.  I have not made a return appointment  to see me, but be glad to see her at her or Dr. Fabio [name] request at any  time.   [name] Plane, MD  Electronically Signed   EG/MEDQ  D:   02/15/2010  T:  02/16/2010  Job:  [id number]   cc:   [name] Pick. Eden Emms, MD, [facility]  [name] M. [name], MD

## 2010-07-10 NOTE — Assessment & Plan Note (Signed)
[facility]                            CARDIOLOGY OFFICE NOTE   NAME:[name], [name] DANCEL                   MRN:          [id number]  DATE:12/10/2006                            DOB:          [birth year]    [name] returns today for followup.  She has known coronary artery  disease with previous stent to the LAD.  She had been having recurrent  chest pains.   I had her catheterized by Dr. Excell Seltzer last week.  The patient has an  ostial LAD lesion.  I actually reviewed the films on [employer] today  before seeing the patient.  The lesion is smooth.  There is a pressure  gradient across it on catheter engagement.  Dr. Excell Seltzer and I talked  about the case extensively.  Her best treatment option, unfortunately,  would probably be an off-pump LIMA if her symptoms warranted it.  Currently, [name] is stable.  She has some chest pain.  We think it is  angina.  There is a sort of pressure in her chest.  She only gets it  with significant exertion.  She has not had any prolonged episodes or  rest pain.  For the time being, I told [name] that we would continue  to treat her medically.  This is what she wanted.  She said currently if  the best treatment option was off-pump LIMA, she would not want to  entertain this at this time, as she says I do not think I need it.   REVIEW OF SYSTEMS:  Otherwise negative.  She has been compliant with her  medications.  Remarkable for some flushing.  This is not clearly related  to her Niaspan.  It may be related to hormonal problems.   CURRENT MEDICATIONS:  Include:  1. Nexium 40 a day.  2. Zyrtec 10 day.  3. Micardis 80 a day.  4. Timolol eye drops.  5. Zetia 10 a day.  6. Niaspan 500 a day.  7. Toprol 50 in the morning, and 25 at night.  8. Aspirin a day.  9. Plavix 75 a day.  10.Fish oil.   EXAMINATION:  Remarkable for a healthy-appearing middle-aged white  female in no distress.  She actually looks a little older  than her  stated age.  Weight is 131.  Blood pressure 122/70.  Pulse 84 and regular.  Afebrile.  Respiratory rate 12.  HEENT:  Normal.  Carotids normal without bruits.  No lymphadenopathy.  No thyromegaly.  No JVP elevation.  LUNGS:  Clear with good diaphragmatic motion.  No wheezing.  S1 and S2 with normal heart sounds.  PMI is normal.  ABDOMEN:  Benign.  Bowel sounds positive.  No tenderness.  No AAA.  No  renal bruits.  No hepatosplenomegaly or hepatojugular reflux.  Catheterization site is well healed without residual bruit.  Femoral are  +3.  PTs are +2.  There is no lower extremity edema.  NEUROLOGIC:  Nonfocal.  There is no muscular weakness.   IMPRESSION:  1. Coronary disease with previous stenting to the left anterior      descending.  Stents patent.  Ostial left anterior descending lesion      probably in the 50% to 60% range.  Stable angina with nonischemic      Myoview.  Continue medical therapy.  Increase beta blocker to 50 of      Toprol in the morning and 50 at night.  Follow up in 3 months.  2. Hypercholesterolemia.  We will have to check through my records to      see if she is intolerant statins.  I suspect that her Niaspan may      be causing her flushing.  She will hold this, and continue her      Zetia for the time being.  Followup lipid and liver profile in 6      months.  3. Hypertension.  Currently well controlled on Micardis.  Continue low-      salt diet.  4. History of reflux.  Continue low-spice diet.  No late evening      meals.  Continue Nexium 40 a day.  5. Seasonal allergies are starting to bother her again.  She has a      prescription for Zyrtec.  She knows to avoid this medicine in areas      where sleepiness may be dangerous.  6. Overall, I think [name] is doing well.  I suspect that given her      fairly young age, she will eventually need an off-pump LIMA to the      LAD.  She has large left dominant circulation, and no significant       disease in a non-dominant right.   I will see her back in 3 months.  She will call us if she has any  significant change in her anginal pattern.     [name]. Eden Emms, MD, [facility]  Electronically Signed    PCN/MedQ  DD: 12/10/2006  DT: 12/11/2006  Job #: [id number]

## 2010-07-10 NOTE — Assessment & Plan Note (Signed)
[facility]                            CARDIOLOGY OFFICE NOTE   NAME:[name], [name]                   MRN:          [id number]  DATE:11/25/2007                            DOB:          August 31, 1935    [name] returns today for followup.  She has known coronary artery  disease.  She has had a previous stent to the mid-LAD.  Her last cath in  August 2008 showed a borderline 50-60% ostial LAD lesion and a patent  LAD stent.  She has been doing well.  She had a Myoview study on  November 12, 2007, which I reviewed with her.  It was normal with no  evidence of anterior wall ischemia.  Her EF is 89%.  She had a  hypertensive response to exercise.   The patient has had labile hypertension for quite some time.  She at one  point had low blood pressures on Micardis.   She has been intolerant to lot of medications.  I told her that I  thought she should probably be back on some blood pressure pill besides  her beta-blocker.  We will try to start her on low-dose lisinopril and  see how she tolerates this in regards to her cholesterol.  She has been  intolerant to statin drugs.   She seems to tolerate Trilipix and she is little bit concerned about the  cost of this.   She is otherwise doing well.  She gets occasional chest pain.  She has  not had to take any nitroglycerin.  She was little anxious this morning.  She does not like driving on [address].   Her review of systems is remarkable for an occasional flare nor asthma  this time a year.  Otherwise, negative.   Past medical history is remarkable for,  1. Hyperlipidemia.  2. Coronary disease.  3. Reflux.  4. Seasonal allergies.  5. Hypertension.  6. Asthma.  7. Irritable bowel syndrome.  8. Fibromyalgia.  9. Glaucoma.  10.Diverticulosis.   Her medications include,  1. Nexium 40 a day.  2. Zyrtec 10 a day.  3. Lisinopril 10 a day.  4. Vitamin B12.  5. Vitamin C.  6. Timolol eye  drops.  7. Zetia 10 a day.  8. Niaspan 500 a day.  9. Trilipix.  10.Calcium.  11.Toprol 50 in the morning, 25 in the evening.  12.Aspirin.  13.Plavix.  14.Flaxseed oil.  15.Zinc.  16.Nasonex spray.   PHYSICAL EXAMINATION:  GENERAL:  Remarkable for a healthy-appearing  elderly white female in no distress.  VITAL SIGNS:  Her weight is 133, blood pressure was initially 180/77  came down to 160/80, pulse 60 and regular, respiratory rate 14,  afebrile.  HEENT:  Unremarkable.  NECK:  Carotids are normal without bruit, no lymphadenopathy,  thyromegaly, or JVP elevation.  LUNGS:  Clear.  Good diaphragmatic motion.  No wheezing.  HEART:  S1, S2.  No S4 or gallop.  PMI normal.  ABDOMEN:  Benign.  Bowel sounds positive.  No AAA.  No bruit.  No  hepatosplenomegaly or hepatojugular reflux.  No tenderness.  EXTREMITIES:  Distal pulses are intact.  No edema.  NEUROLOGIC:  Nonfocal.  SKIN:  Warm and dry.  No muscular weakness.   IMPRESSION:  1. Hypertension, suboptimally controlled.  Continue beta-blocker.  Add      lisinopril.  Followup in 3 months.  2. Coronary disease, patent stent to the LAD by catheterization in      August 2008, ostial LAD disease.  No ischemia on Myoview.  Continue      to monitor.  May need to add long-acting nitrates in the future.  3. Hyperlipidemia.  The patient has been seen in Lipid Clinic.  I will      have to review this.  She indicates being on Trilipix, but her      chart indicates that she is on Niaspan and Zetia.  In either case,      she has been intolerant to multiple statins and I suspect we will      not have ideal lipid control for her.  4. Irritable bowel syndrome, stable.  Weight is good without      significant diarrhea, nausea, or vomiting.  Follow up with GI.  5. Seasonal allergies.  Continue Alavert p.r.n. no active wheezing at      this time.   Overall, [name] is doing well.  We will see how she responds to her  lisinopril.  I will see  her back in about 43-month.     [name]. Eden Emms, MD, [facility]  Electronically Signed   PCN/MedQ  DD: 11/25/2007  DT: 11/25/2007  Job #: [id number]

## 2010-07-10 NOTE — Cardiovascular Report (Signed)
[name], [name]            ACCOUNT NO.:  [id number]   MEDICAL RECORD NO.:  [id number]          PATIENT TYPE:  OIB   LOCATION:  1962                         FACILITY:  [facility]   PHYSICIAN:  Veverly Fells. Excell Seltzer, MD  DATE OF BIRTH:  07-31-1935   DATE OF PROCEDURE:  11/27/206  DATE OF DISCHARGE:                            CARDIAC CATHETERIZATION   PROCEDURE:  1. Left heart catheterization.  2. Selective coronary angiography.  3. Left ventricular angiography.   INDICATIONS:  [name] is a 75 year old woman with known CAD.  She  has had stenting of the proximal and midportions of the LAD.  She  presented with recurrent angina and underwent an exercise Myoview study  that showed no evidence of ischemia.  However, there was significant  chest pain and recovery with EKG changes.  In the setting of known  ostial LAD stenosis, she was referred for cardiac catheterization.   PROCEDURAL DETAILS:  Risks and indications of the procedure were  reviewed with the patient and informed consent was obtained.  The right  groin was prepped, draped and anesthetized with 1% lidocaine.  Using a  modified Seldinger technique, a 5-French sheath was placed in the right  femoral artery.  Multiple views of the right and left coronary arteries  were taken.  In the left coronary artery, a 5-French JL-4 and JL-3.5  catheter were used to get good images of the proximal LAD.  A 5-French  JR-4 catheter was used to image the right coronary artery.  A 5-French  pigtail catheter was used for ventriculography and a pullback across the  aortic valve was done.  All catheter exchanges were performed over a  guidewire.  There were no complications.   FINDINGS:  Aortic pressure 168/75 with mean of 116, left ventricular  pressure 116/18.   CORONARY ANGIOGRAPHY:  The left mainstem is angiographically normal.  It  is a short segment that bifurcates into the LAD and left circumflex.   The LAD is a medium-caliber  vessel that courses down and reaches the LV  apex.  There is a widely patent stent in the proximal LAD.  There is  also a widely patent stent just beyond the first septal perforator and  first diagonal.  Neither stent has any evidence of restenosis.  The  ostium of the LAD has moderate stenosis.  Angiographically, this  stenosis is 50%.  However, with a 4-French JL-3.5 catheter selectively  engaged in the ostium, there is a 60-mm pressure gradient and the  patient's chest pain is reproduced.   The left circumflex is a large-caliber vessel.  The circumflex is  dominant.  There is a small intermediate branch and a medium-sized first  OM.  There is a large left posterolateral branch and left PDA that arise  from the distal circumflex.  There is no angiographic stenosis  throughout the left circumflex.   The right coronary artery is small and nondominant.  There is no  significant angiographic stenosis.  It supplies right ventricular  branches.   Left ventricular function assessed by ventriculography is normal to  hyperdynamic.  The LVEF is estimated at  65% to 70%.  There is no mitral  regurgitation.   ASSESSMENT:  1. Single-vessel coronary artery disease with widely patent left      anterior descending stent and moderate ostial left anterior      descending stenosis.  2. Normal right coronary.  3. Normal left circumflex.  4. Normal left ventricular function.   DISCUSSION:  [name] has a borderline ostial LAD stenosis.  I have  compared the angiogram to her prior studies.  She just at an IVUS  performed last year by Dr. Juanda Chance that showed the minimal lumen diameter  of the vessel at 2.1 x 2 mm.  The reference vessel stent lumen measured  2.4 x 2.4 and the reference lumen of the LAD measured 2.8 x 2.8.  There  has not been significant angiographic change since this study.  However,  the pressure damping and reproducible symptoms are significant.  I am  going to review all of  these findings with Dr. Eden Emms we will discuss  treatment options which include ongoing medical therapy in the setting  of a normal perfusion study versus consideration of revascularization  with either percutaneously or with an off-pump LIMA to LAD.  The ostial  nature of the LAD stenosis complicates percutaneous treatment to a  degree.      Veverly Fells. Excell Seltzer, MD  Electronically Signed     MDC/MEDQ  D:  11/27/2006  T:  11/28/2006  Job:  [id number]   cc:   [name]. Eden Emms, MD, [facility]

## 2010-07-10 NOTE — Assessment & Plan Note (Signed)
[facility]                               LIPID CLINIC NOTE   NAME:Hackert, CELES DEDIC                   MRN:          [id number]  DATE:07/02/2007                            DOB:          1936/01/11    [name] comes in for follow-up of her hyperlipidemia therapy which  at her last visit included Crestor 5 mg daily, Niaspan 500 mg daily,  Zetia 10 mg daily, fish oil 1 gram twice a day and flaxseed oil of  unknown strength. She has been compliant with these therapies except for  Crestor. The Crestor has caused recurrence of muscle aches and pains  that she has experienced before on other statins. They were all over  including large muscle groups in the thighs, back and shoulders. She has  been off Crestor for about two weeks now and they are very slow to  resolve.  Her other medications include Coenzyme Q-10 100 mg daily,  Nexium, Zyrtec, Micardis, vitamin B12, vitamin C, atenolol, calcium,  Toprol, aspirin 81 mg, Plavix, zinc, Nasonex and glucosamine.   PHYSICAL EXAM:  Weight of 132 pounds, blood pressure is 150/70, heart  rate is 72.   LABORATORY DATA:  Total cholesterol 250, triglycerides 289. HDL 48.4,  LDL 169.6.  Liver function tests are within normal limits.   ASSESSMENT:  Ms. Houpt was unable to tolerate Crestor. Her laboratory  values show triglycerides not at goal and greatly increased from 199 in  January of this year. Her LDL is also somewhat higher than previous  visit in January  when they were 148.5. Her goals include triglycerides  less than 150, HDL greater than 45 and LDL of less than 70 due to her  significant history of coronary artery disease.  In the past, Ms.  Giannattasio has been on a trial of Welchol at the end of 2007 leading into  the beginning of 2008. This was stopped and the patient is unable to  tell me what the reason was and looking through her chart it is unclear  to me if she did not tolerate it or if it was  ineffective or both. We  talked about different options for pharmacotherapy including fibrates,  especially because her triglycerides have risen significantly and are  near 300 now.  We talked about trying Welchol again. She has tried to  make dietary improvements as instructed at her last visit with [place]  including decreasing the amount of peanut butter she eats, decreasing  beef,  increasing chicken, always baked, never fried. She does eat fish  occasionally two times a week or so but admits to it being usually  fried.  She continues to have a good exercise program. About 4 or 5 days  out of every week she walks for 30-40 minutes at a time.  Her weight is  holding at around 130. Her systolic blood pressure is actually a little  elevated at this visit at 150.   PLAN:  I asked Ms. Barbato to increase her fish oil to 2 grams twice  daily. We are going to continue the Niaspan, Zetia and  flaxseed oil. I  asked her to increase the coenzyme Q-10 to 200 mg a day. We are also  going to try Trilipix 45 mg capsules every other day x2 weeks. If she  tolerates this then I instructed her to increase to 45 mg daily.  Samples were given to her as well as a prescription and a discount card.  I also wrote for refills of her Zetia which she was out of.  I  encouraged her to continue with the exercise program that she has and  continue with a heart healthy diet. We are going to follow up with her  in 6 weeks in the hopes that she will have been on Trilipix 45 mg daily  for about 4 weeks by then. She was instructed to call us with any  questions or concerns in the meantime, especially if she is unable to  tolerate the Trilipix so that we can make an adjustment right away.      Charolotte Eke, PharmD  Electronically Signed      Rollene Rotunda, MD, [facility]  Electronically Signed   TP/MedQ  DD: 07/02/2007  DT: 07/02/2007  Job #: [id number]

## 2010-07-10 NOTE — Assessment & Plan Note (Signed)
[facility]                            CARDIOLOGY OFFICE NOTE   NAME:[name], [name]                   MRN:          [id number]  DATE:03/31/2007                            DOB:          [birth year]    [name] returns today for followup.  She has single-vessel coronary  artery disease with previous stent to the LAD with moderate disease at  the ostium of the LAD proximal to the stent.  She is not having any  significant chest pain.  She has been active.  She is not having  dyspnea, diaphoresis or palpitations.  She is getting some dizziness.  The symptoms are not postural.  I do not think they are related to her  blood pressure medicines.  She just feels sometimes like her head is  full and that she sways to the right side in particular.  She has not  had a history of carotid disease, TIA or CVA.  She has had vertigo in  the past and she says this is different; she does not have that spinning  feeling.  There is no associated nausea and no visual disturbances.   I told [name] that was a little concerned about these symptoms and  would probably order an MRI.  I think the MRI would be more sensitive to  cerebellar disease than a CT.   REVIEW OF SYSTEMS:  Her review of systems is otherwise negative.   MEDICATIONS:  1. Nexium 40 a day.  2. Zyrtec for seasonal allergies.  3. Micardis 80 a day.  4. Timolol eye drops.  5. Zetia 10 a day.  6. Niaspan 500 a day.  7. Calcium.  8. Toprol 50 in the morning, 25 at night.  9. An aspirin a day.  Plavix 75 mg a day.   ALLERGIES:  She is intolerant to STATINS.   PHYSICAL EXAMINATION:  Her exam is remarkable for a healthy-appearing  elderly white female in no distress.  Blood pressure is 116/70, pulse 72 and regular, not postural, weight  130, afebrile, respiratory rate 14.  HEENT:  Unremarkable.  Carotids normal without bruit.  No lymphadenopathy, thyromegaly or JVP  elevation.  LUNGS:  Clear with  good diaphragmatic motion, no wheezing.  S1, S2.  Normal heart sounds.  PMI normal.  ABDOMEN:  Benign.  Bowel sounds are positive.  No AAA.  No tenderness.  No hepatosplenomegaly or hepatojugular reflux.  Distal pulses are intact  with no edema.  SKIN:  Warm and dry.  NEUROLOGIC:  Nonfocal.  No muscular weakness.   IMPRESSION:  1. Coronary artery disease, previous stent to the left anterior      descending with moderate ostial disease, followup Myoview,      September 2009.  She will call us if her nitroglycerin is out of      date.  We will continue her aspirin and beta blocker and Plavix.  2. Hypercholesterolemia.  Unfortunately, she really is intolerant to      statins.  We will continue to try to keep her on Niaspan and Zetia.      Followup lipid and liver  profile in 6 months.  3. Hypertension, currently well controlled.  Continue Micardis.  At      some point, we may need to back off on this, as her blood pressure      is tending towards the low side.  She is not postural and her      symptoms of dizziness are not postural.  4. Dizziness, rule out cerebellar or central nervous system      cerebrovascular accident or transient ischemic attack.  We will      order an MRI, follow up with Dr. [name].  Overall, I think that      Alexander's heart is stable and I will see her in September to      assess her left anterior descending disease.     [name]. Eden Emms, MD, [facility]  Electronically Signed    PCN/MedQ  DD: 03/31/2007  DT: 04/01/2007  Job #: [id number]

## 2010-07-10 NOTE — Assessment & Plan Note (Signed)
[facility]                               LIPID CLINIC NOTE   NAME:Shaddix, TAKARA SERMONS                   MRN:          [id number]  DATE:09/18/2006                            DOB:          Oct 15, 1935    RETURN OFFICE VISIT:   PAST MEDICAL HISTORY:  1. Hyperlipidemia.  2. Coronary artery disease, status post stents x2 in her left anterior      descending.  3. Gastroesophageal reflux disease.  4. Fibromyalgia.  5. Diverticulosis.  6. Hypertension.  7. Glaucoma.  8. Irritable bowel disease.   MEDICATIONS:  1. Nexium 40 mg daily.  2. Toprol 50 mg daily.  3. Aspirin 81 mg daily.  4. Zyrtec 10 mg daily.  5. Vitamin B12 daily.  6. Vitamin C daily.  7. Calcium daily.  8. Timolol eye drops in both eyes.  9. Zetia 10 mg daily.  10.Plavix 75 mg daily.  11.Niaspan 500 mg at bedtime.  12.Micardis 80 in the a.m., 40 mg in the p.m.  13.Toprol 50 mg a.m., 25 p.m.   MEDICATION INTOLERANCES:  STATIN MEDICATIONS.  All have caused myalgias.   PHYSICAL EXAMINATION:  VITAL SIGNS:  Weight 131 pounds, blood pressure  120/60, heart rate 70.   LABORATORY DATA:  Total cholesterol 203, triglycerides 236, HDL 45.7,  LDL 150.  LFTs within normal limits.  CBG 90, BUN 11, creatinine 0.6.   ASSESSMENT:  Ms. Borel is a pleasant 75 year old woman who returns to  lipid clinic today with no chest pain, no shortness of breath, no muscle  aches or pains.  she is compliant with Zetia.  She has self-discontinued  the Niaspan approximately 2 weeks ago secondary to her thought of  allergic reaction.  However, it seems that it was just a hot flash-type  reaction from the Niaspan.  She was tolerating Niaspan quite well in the  past.  However, her aspirin was decreased from 325 now down to 81 mg.  At this point in time, she started noticing the hot flash tingly  reaction in her face and ears after taking the Niaspan.  After a lengthy  conversation, she is willing to take 2  baby aspirin with a small snack  30 minutes prior to bed, taking the Niaspan as she goes to bed, and  hopefully this will help prevent the hot flash tingly feeling that she  was getting from the Niaspan.  She is unable to take a 325 mg aspirin.  She states that it upsets her stomach.  She continues to exercise by  walking 40 minutes 5 times a week.  She says this totals approximately 1-  1/2 miles, and is willing to increase to 2 miles during that walk time.  She walks 5 times a week every other week.  Her diet has lots of fruits  and vegetables.  She grows them in her garden.  She seems to eat a  fairly heart-healthy diet.  However, she does seem to possibly be eating  too many peanut butter and jelly crackers and ice cream.  She says that  in between her meals when  she is hungry she eats about 3 peanut butter  graham crackers and eats lowfat ice cream in the evenings for dessert.  Her total cholesterol is greater than goal of less than 200.  Her  triglycerides are greater than goal of less than 150.  Her HDL is less  at goal of greater than 40, and her LDL is greater than goal of less  than 70.  We again had a discussion regarding statin medications.  She  is unwilling to retry any of these at this time, given her history of  myalgias across her shoulders, down her arms, and throughout her legs  with statins in the past.   PLAN:  1. Continue current medication regimen.  2. Continue heart-healthy diet, decreasing the amount of peanut butter      and ice cream for snacks.  3. Continue exercise regimen.  Increase exercise 5 times a week and up      to 2 miles of her walking.  4. Follow-up visit in 4 weeks to follow up diet, exercise, and      tolerance of Niaspan.      Leota Sauers, PharmD  Electronically Signed      Jesse Sans. Daleen Squibb, MD, [facility]  Electronically Signed   LC/MedQ  DD: 09/18/2006  DT: 09/19/2006  Job #: [id number]

## 2010-07-10 NOTE — Assessment & Plan Note (Signed)
[facility]                            CARDIOLOGY OFFICE NOTE   NAME:[name], [name]                   MRN:          [id number]  DATE:11/21/2006                            DOB:          1935-08-13    [name] returns today for followup.   She has had previous overlapping stents to the LAD.  She has been having  chest pain.   Her last cath on August 23, 2005, showed that the ostium and proximal LAD  were patent.  Proximal to the ostial stent there was a 40-50% lesion.  It had IVUS.  It had the dimension of 2.1 x 2 as compared to a reference  lumen of 2.8 x 2.8 and an in-stent lumen of 2.4 x 2.4.  Dr. Juanda Chance felt  that continued medical therapy was warranted.  However, the patient  continued to have chest pain that is relieved with nitro from time to  time.   She had a stress Myoview study done on September 8th.  She had an  abnormal EKG response with good LV function at 85%.  I reviewed her  scintigraphic images and they were normal.  However, I talked to  [name] at length and I am still concerned that she is having chest  pain which may be anginal and has a 40-50% ostial lesion prior to her  stent.   I recommended that she have repeat heart cath.  I am not here next week  and will arrange for Dr. Excell Seltzer to do it.  We will tentatively set it up  for the [facility], although this may need to be changed if she needs  followup IVUS or intervention.   She is on a fairly good medication.  We have increased her  Micardis  recently for the hypertensive response she had with exercise.  The  patient is not on long-acting nitrates but I would like to see what her  cath shows before doing this.   REVIEW OF SYSTEMS:  Otherwise negative.   PAST MEDICAL HISTORY:  Otherwise remarkable for:  1. Diverticulosis.  2. History of headaches.  3. History of hypercholesterolemia.  4. Glaucoma.   MEDICATIONS:  1. Nexium 40 a day.  2. Zyrtec 10 a day.  3. B12  shots.  4. Timolol 0.5% in both eyes.  5. Zetia 10 a day.  6. Plavix 75 a day.  7. Calcium.  8. Micardis 80 in the morning and 40 at night.  9. An aspirin a day.   She is allergic to:  1. PENICILLIN.  2. CODEINE.   PHYSICAL EXAMINATION:  GENERAL:  Remarkable for an elderly white female  in no distress.  VITAL SIGNS:  Weight is 130, blood pressure is 130/77, pulse 73 and  regular.  She is afebrile.  Respiratory rate is 14.  HEENT:  Normal.  NECK:  Carotids normal without bruit.  There is no lymphadenopathy.  No  thyromegaly.  No JVP elevation.  LUNGS:  Clear with good diaphragmatic motion.  No wheeze.  HEART:  There is an S1 S2 with normal heart sounds.  PMI is normal.  ABDOMEN:  Benign.  There is no AAA.  No tenderness.  No  hepatosplenomegaly, hepatojugular reflux.  No bruit.  EXTREMITIES:  Distal pulses are intact.  No edema.  NEUROLOGIC:  Nonfocal.  SKIN:  Warm and dry.  There is no muscular weakness.   Her baseline EKG shows a sinus rhythm with left atrial enlargement, and  ST segment flattening in the inferolateral leads.   IMPRESSION:  1. Persistent recurrent chest pain, despite normal scintigraphic      images.  2. History of 45% lesion proximal to overlapping stents in the left      anterior descending artery. Left heart cath to be arranged with Dr.      Excell Seltzer in the [facility] next week.  Continue aspirin and Plavix.  3. Hypercholesterolemia.  The patient has been intolerant to Statins      in the past.  She is currently on Zetia and Niaspan.  We will      continue these medications and she will get her lipid and liver      profile checked in 6 months.  4. Hypertension.  Currently borderline controlled with exercise.      Continue higher dose of Micardis 80 in the morning and 40 at night.      Depending on the results of her heart cath, we may end up adding      long-acting nitrates to her regimen.     [name]. Eden Emms, MD, [facility]  Electronically Signed     PCN/MedQ  DD: 11/21/2006  DT: 11/21/2006  Job #: [id number]

## 2010-07-10 NOTE — Assessment & Plan Note (Signed)
[facility]                               LIPID CLINIC NOTE   NAME:Griffitts, DEMONI PARMAR                   MRN:          [id number]  DATE:05/12/2008                            DOB:          Sep 17, 1935    PAST MEDICAL HISTORY:  Hyperlipidemia, gastroesophageal reflux disease,  seasonal allergies, and hypertension.   MEDICATIONS:  1. Protonix 40 mg daily.  2. Zetia 10 mg daily.  3. Plavix 75 mg daily.  4. Metoprolol 50 mg twice daily.  5. Niacin 500 mg at bedtime.  6. Aspirin 81 mg 2 at bedtime.  7. Travatan eye drops.  8. Nasonex b.i.d.  9. Claritin once daily.  10.Lisinopril 20 mg daily.  11.Glucosamine 3 times daily.  12.Flaxseed once daily.  13.Calcium with vitamin D daily.  14.Fish oil 2 tablets 3 times daily.   PHYSICAL EXAMINATION:  VITAL SIGNS:  Weight 150 pounds, blood pressure  127/78, and heart rate of 76.   LABORATORY DATA:  Total cholesterol was 239, triglyceride 211, HDL 57,  and LDL 137.  LFTs within normal limits.   ASSESSMENT:  Ms. Calia is a very pleasant woman who returns to Lipid  Clinic today with no chest pain, no shortness of breath, no muscle aches  or pains.  She is compliant of current medication regimen.  She does  complain of severe itchiness, hot flushy ringing in ears feeling when  she takes her Niaspan.  She was recently started 500 mg at bedtime.  She  says that she takes that after evening meal and then continues to snack  for about an hour afterwards on yogurt, green crackers, or fruit, etc,  because this helps decrease her flushing reactions than hot flash  reactions that she is having with the niacin.  I have encouraged her to  increase her aspirin to 3 baby aspirin prior to taking the niacin.  I  have asked her to do this for 5 more days, if this does not help  decrease the hot flash, flushing, and itchy reaction that she is having,  then I have told her to stop taking the Niaspan.  Her father has  recently passed away and she says that it has altered her life  dramatically that she has been very depressed and has been eating a lot  of snacky comfort foods that she had normally been eating.  She also has  decreased her walking until about 2 weeks ago where she is just now  restarted 5-6 days a week walking 30 minutes at a time.  We had a  lengthy conversation regarding the loss of her parents that he was 39  years old, had a very good life.  She does seem to have come to terms  with this loss and seems to be on them and she does realize that she has  made some poor diet choices in the last few months and is willing to  make a better effort at choosing low-fat, low-carbohydrate meals as well  as snacking on more fruits and vegetables instead of cookies, chips,  cakes, etc.  Her total cholesterol  is greater than goal of less than  200, triglycerides are greater than goal of less than 150, HDL is at  goal of greater than 40 and LDL is greater than goal of less than 100.  However, it improved since last visit where it was 149 and 137.  It is  very challenging to identify medications that Ms. Walsworth is able to  tolerate.  She has been very intolerant to statins in the past, all  causing myalgias with or without changes in her LFTs.  It does not sound  like she is going to tolerate Niaspan.  She is currently being treated  with Zetia and lifestyle modification.  I will continue to encourage her  lifestyle modification and make small changes to her diet and encourage  exercise as much as possible.  I do not foresee Ms. Stanback attaining  her lipid panel goals, but I will continue to make an effort to help her  become as close as possible to them.   PLAN:  1. Continue Niaspan for 5 more days with increasing aspirin to 3      tablets 30 minutes prior.  2. If this is not relieved some of the adverse events then she will      discontinue the Niaspan.  3. Continue titrating up fish oil.  4.  Continue lifestyle modification.  5. Follow up visit in 4-6 month for lipid panel and LFTs and make      adjustments at that time.      Leota Sauers, PharmD  Electronically Signed      Jesse Sans. Daleen Squibb, MD, [facility]  Electronically Signed   LC/MedQ  DD: 05/12/2008  DT: 05/13/2008  Job #: [id number]

## 2010-07-10 NOTE — Assessment & Plan Note (Signed)
[facility]                               LIPID CLINIC NOTE   NAME:[name], [name]                   MRN:          [id number]  DATE:08/20/2007                            DOB:          [birth year]    Return office visit for lipid clinic.   PAST MEDICAL HISTORY:  1. Hyperlipidemia.  2. Gastroesophageal reflux disease.  3. Seasonal allergies.  4. Hypertension.  5. Asthma.  6. Coronary artery disease, status post LAD stents.  7. Irritable bowel disease.  8. Fibromyalgia.  9. Glaucoma.  10.Diverticulosis.   MEDICATIONS:  1. Nexium 40 mg daily.  2. Zyrtec 10 mg daily.  3. Micardis 80 mg daily.  4. Vitamin B12 daily.  5. Vitamin C daily.  6. Timolol eye drops.  7. Zetia 10 mg daily.  8. Niaspan 500 mg at bedtime.  9. Calcium 600 mg daily.  10.Toprol 50 mg in the morning and 25 in the evening.  11.Aspirin two baby aspirin daily.  12.Plavix 75 mg daily.  13.Glucosamine 3 times daily.  14.Flaxseed oil daily.  15.Fish oil 1 g twice daily.  16.Zinc daily.  17.Nasonex sprays daily.   VITAL SIGNS:  Weight 132 pounds, blood pressure 138/60, and heart rate  in the 80s and regular   LABORATORY DATA:  Total cholesterol 233, triglycerides 263, LDL 150, and  HDL 46.  LFTs within normal limits   ASSESSMENT:  Ms. Greis is a very pleasant 75 year old woman who  returns to Lipid Clinic today with no chest pain, no shortness of  breath, but is complaining of some leg muscle pain.  She had recently  been started on Trilipix 45 mg daily which she tolerated well.  She then  was given samples of 135 mg daily, which she was taking and caused  muscle aches and pains.  She subsequently stopped taking the Trilipix  135 approximately a week ago.  She also is intolerant to all statin  medications and had GI upset with WelChol.  She is unable to increase  her dose of Niaspan given the amount of hot flashes that she is  currently having with 500 mg of Niaspan  and she is unwilling to go up  any to any higher dose.  Her total cholesterol is greater than goal of  less than 200, triglycerides are greater than goal of less than 150, her  HDL is at goal of greater than 40, however, has decreased since last  visit, but she has decreased the amount of exercise that she is doing  because she is working so much in her garden.  Her LDL is greater than  goal of less than 70.  I doubt that Ms. Angelucci will ever get her lipid  panel to goal given her history of intolerances to statin drugs and  intolerance to WelChol, Niaspan high dose, etc.  She has made strides in  her diet therapy and lifestyle management.  She exercises for 30-40  minutes 5 times a week on most days.  However, this has decreased to 3  times a week now that she has so many beans  growing in her garden.  She  takes most of her day to pick the beans and to can them.  However, this  season will be over shortly and she will restart her exercise regimen.   PLAN:  Our plan is to:  1. Increase fish oil to 2 g twice daily as she was previously told at      her last visit.  She just did not remember or do this.  2. Continue Zetia 10 mg daily and her Niaspan 500 mg daily.  3. Continue low-fat, low-carbohydrate diet.  4. Continue exercise regimen.  5. Followup visit in 5 months for lipid panel and LFTs.  We will make      adjustments at that time.      Leota Sauers, PharmD  Electronically Signed      Jesse Sans. Daleen Squibb, MD, [facility]  Electronically Signed   LC/MedQ  DD: 08/20/2007  DT: 08/21/2007  Job #: [id number]

## 2010-07-10 NOTE — Assessment & Plan Note (Signed)
[facility]                            CARDIOLOGY OFFICE NOTE   NAME:Casstevens, NEIL ERRICKSON                   MRN:          [id number]  DATE:10/22/2006                            DOB:          [birth year]    Vermelle returns today for followup. She has had significant coronary  artery disease. She has had tandem non overlapping cipher stents to the  ostium of the LAD. Patient's last heart catheterization was in June of  2007. At the time there was a 40% to 50% stenosis proximal to the first  tandem stent. The stents were patent.   Lacretia initially had denied any significant chest pain or angina,  however near the end of our conversation she indicated there was an area  at home when she walks up a hill that she gets reproducible dyspnea, and  this was somewhat worrisome to me as an anginal equivalent.   She has nitroglycerine at home. She will take them and see if this  helps. We initially we going to put off doing a Myoview for another 6  months but I told her I would like to do it next week. Sensitivity for  restenosis should be high in an LAD lesion and I think the Myoview is a  good risk stratifying test. She does not want to go directly to  catheterization.   She is in good medical therapy.   Last time I saw her, we started her on Micardis and this has helped her  blood pressure quite a bit. She tends to take it later in the afternoon  because she feels a little lightheaded on the Micardis and Toprol if she  takes them both in the morning.   REVIEW OF SYSTEMS:  Otherwise negative.   MEDICATIONS:  Include:  1. Nexium 40 a day.  2. Zyrtec 10 a day.  3. B12.  4. Timolol eye drops.  5. Zetia 10 a day.  6. Plavix 75 a day.  7. Niaspan 500 a day.  8. Calcium.  9. Micardis 4mg   .  10.Toprol 50 in the morning and 25 at night.  11.An aspirin a day.   PHYSICAL EXAMINATION:  Remarkable for an elderly white female in no  distress. Affect is  appropriate. Weight is 132, blood pressure is  140/70, pulse 77 and regular, she is afebrile, respiratory rate is 14.  HEENT: Normal. Carotids normal without bruits. There is no  lymphadenopathy. No thyromegaly. No JVP elevation.  LUNGS: Clear with good diaphragmatic motion. No wheezing.  There is an S1, S2 with normal heart sounds. PMI is normal.  ABDOMEN: Benign. Bowel sounds positive. No tenderness. No triple A. No  hepatosplenomegaly or hepatojugular reflux. There is no bruits.  Femorals are +3 bilaterally without bruits. PT s are +2. There is no  lower extremity edema.  NEURO: Nonfocal.  SKIN: Warm and dry.  There is no muscular weakness.   IMPRESSION:  1. Previous non overlapping stents to the LAD, 40% to 50% lesion in      the native proximal vessel on last catheterization in June.      Episodes  of exertional dyspnea, follow up Myoview to make sure this      is not an anginal equivalent, continue current dose of beta blocker      and aspirin.  2. Hypertension, currently better controlled on Micardis. Continue low      salt diet. If anything I may increase her beta blocker further in      the future, particularly if she has more restenosis.  3. Hypercholesterolemia, the patient has been seen in lipid clinic.      Her last LDL cholesterol was 149. At that time Zetia was added to      her Niaspan. I will have to look back through her records but I      suspect she has been intolerant to statins in the past. Her      triglycerides have been elevated at 236 and I think the combination      of Niaspan and Zetia is reasonable.  4. History of reflux, currently stable. Continue Nexium 40 a day.  5. History of osteoporosis, continue calcium supplementation. Patient      is not on Fosamax. I will leave this up to her primary care doctor      to see if he wants to supplement her calcium with this.  6. Allergies, seasonal. Continue Zyrtec, the patient does not get      drowsy with it. I  told her that Claritin will be an alternative.   Further recommendations will be based on the results of her Myoview but  I will have a low threshold to catheterize her given the extent of the  stenting she has had done to the ostium of the LAD.     [name]. Eden Emms, MD, [facility]  Electronically Signed    PCN/MedQ  DD: 10/22/2006  DT: 10/23/2006  Job #: [id number]

## 2010-07-13 ENCOUNTER — Telehealth: Payer: Self-pay | Admitting: Cardiovascular Disease

## 2010-07-13 DIAGNOSIS — I2510 Atherosclerotic heart disease of native coronary artery without angina pectoris: Secondary | ICD-10-CM

## 2010-07-13 MED ORDER — NITROGLYCERIN 0.4 MG SL SUBL: SUBLINGUAL_TABLET | SUBLINGUAL | Status: DC

## 2010-07-13 NOTE — Procedures (Signed)
[facility]                                 ULTRASOUND STUDY   NAME:Gertner, KELIYAH SPILLMAN                   MRN:          [id number]  DATE:09/26/2005                            DOB:          April 05, 1935    SESSION NUMBER:  [id number]   PROCEDURE:  Multiplanar abdominal ultrasound imaging was performed in the  upright, supine, right and left lateral decubitus positions.   RESULTS:  Abdominal aorta normal, diameter 1.9 cm.  The IVC is patent.   The pancreas appears normal throughout the head, body and tail without  evidence of ductal dilatation, pancreatic masses, or peripancreatic  inflammation.   Gallbladder is normal at 2.1 mm, thin walled, with no pericholecystic fluid  or intraluminal echogenic foci to suggest gallstone disease.   The common bile duct measures 5.3 mm in maximal diameter without evidence of  intraluminal foci.   The liver appears normal without evidence of parenchymal lesion, ductal  dilatation or vascular abnormality.   Kidneys are normal in appearance.  Right 9.5 cm, left 10.0 cm.   Spleen is normal in size, measuring 7.5 cm without parenchymal lesion.   ASSESSMENT:  This is a normal upper abdominal ultrasound exam without  evidence of cholelithiasis or other specific abnormalities.  Pancreas and  liver are well visualized and appear normal.                                   Vania Rea. Jarold Motto, MD, [facility], [place]   DRP/MedQ  DD:  09/27/2005  DT:  09/27/2005  Job #:  [id number]

## 2010-07-13 NOTE — Cardiovascular Report (Signed)
NAMELASHANNON, BRESNAN            ACCOUNT NO.:  [id number]   MEDICAL RECORD NO.:  [id number]          PATIENT TYPE:  INP   LOCATION:  3735                         FACILITY:  [facility]   PHYSICIAN:  [name]. [name], M.D. [facility] BIRTH:  1935/12/27   DATE OF PROCEDURE:  08/14/2005  DATE OF DISCHARGE:  08/16/2005                              CARDIAC CATHETERIZATION   INDICATIONS:  Ms. Gaynor was a delightful 75 year old woman who was  admitted by Dr. Eden Emms with progressive chest pain suggestive of angina.  She underwent diagnostic catheterization in the [facility] lab demonstrating a  subcritical stenosis of the left anterior descending artery and mid LAD.  Dr. Eden Emms recommended percutaneous intervention and the patient was brought  to the catheterization laboratory.  She was on chronic daily aspirin.  She  was given bivalirudin and oral clopidogrel.  She had CYPHER stents placed to  both proximal left anterior descending artery as well as the mid-LAD. The  mid-LAD had a pristine result, but the proximal LAD had a post procedure  thrombus formation, and she was given bailout glycoprotein inhibition.  There was marked improvement in the appearance of the artery at the time she  left the lab and she has been maintained on continued oral glycoprotein  inhibition for the past 48 hours.  The patient had some nausea initially on  clopidogrel, but has improved over the past two days.  She was brought to  the laboratory today for further follow-up angiography to demonstrate  continued patency of the stented site.   PROCEDURE:  Selective coronary arteriography of the left coronary artery.   DESCRIPTION OF PROCEDURE:  The patient was brought to the catheterization  laboratory and prepped and draped in usual fashion. Through an anterior  puncture, the left femoral artery was entered and a 6-French sheath was  placed. Because of it's seating pattern, we used a JL-3 guiding catheter,  which seemed to  provide the best opacification as the patient has a dominant  circumflex system.  Multiple views of the LAD were then obtained.  I then  reviewed the films with Dr. Charlies Constable and we had reviewed them earlier in  the day.  We were in agreement that continued medical therapy would be  recommended with aspirin and clopidogrel.  The patient was taken off the  table and into the holding area in satisfactory, clinical condition, where  all sheaths were removed.  There were no complications.   ANGIOGRAPHIC DATA:  1.  The left main coronary artery is free of critical disease.  2.  The left circumflex artery as previously noted is a dominant vessel.  It      is unchanged from the diagnostic study.  There is no evidence of      compromise of the circumflex vessel.  3.  The left anterior descending artery demonstrates excellent TIMI-III      flow, and improved appearance of the left anterior descending artery.      On the original study, there was segmental plaquing leading out from the      ostium into the vessel just beyond the ostium.  The vessel distally then      demonstrated a high-grade stenosis just after the diagonal.  There is      about 40% to 40% narrowing at the ostium as noted on the original study.      The previously stented proximal vessel was now widely patent without      significant focal narrowing.  There does not appear to be high-grade      density in the proximal LAD now as was previously noted, and there is      excellent TIMI-III flow across the stent site.  The second stent site is      also widely patent, and there is no significant compromise of the      diagonal.  Runoff into the distal LAD is excellent as is into the      diagonal branch.   CONCLUSIONS:  Repeat diagnostic coronary angiography demonstrating  mild/moderate ostial disease with widely patent stent in the proximal left  anterior descending and mid left anterior descending arteries.   DISPOSITION:  The  patient will need to remain on aspirin and Plavix.  She  will need close follow-up with Dr. Eden Emms.   ADDENDUM:  The films were reviewed with the family in detail.      [name]. [name], M.D. [facility]  Electronically Signed     TDS/MEDQ  D:  08/17/2005  T:  08/17/2005  Job:  [id number]   cc:   Charlton Haws, M.D.  [address]  [address]  [place]  [place] [address]   [name]. [name], M.D. [facility]  [address]  [address]  [place]  [place] [address]

## 2010-07-13 NOTE — Assessment & Plan Note (Signed)
[facility]                              CARDIOLOGY OFFICE NOTE   NAME:[name], [name]                   MRN:          [id number]  DATE:11/28/2005                            DOB:          01-01-1936    PAST MEDICAL HISTORY:  1. Hyperlipidemia.  2. Coronary artery disease status post two stents in her LAD.  3. Gastroesophageal reflux disease.  4. Fibromyalgia.  5. Diverticulosis.  6. Hypertension.  7. Glaucoma.  8. Irritable bowel disease.   CURRENT MEDICATIONS:  1. Timolol eye drops.  2. Caltrate 2 tablets twice daily.  3. Nexium 40 mg daily.  4. Toprol 50 mg daily.  5. Aspirin 325 mg daily.  6. Zyrtec 10 mg daily.  7. Vitamin B, 12 mcg daily.  8. Vitamin C, 250 mg daily.  9. Nasacort twice daily.  10.Glucosamine once daily.  11.Plavix 75 mg daily.   LABORATORY DATA:  Total cholesterol is 257, triglycerides 319, HDL 39, LDL  160.  LFT's within normal limits.   VITAL SIGNS:  Weight 126 pounds, blood pressure 132/70, heart rate was 70.   ASSESSMENT:  [name] is a very pleasant elderly woman to returns to the  lipid clinic today with no chest pain, no shortness of breath, but has self  discontinued her Zetia about 2 months ago secondary to myalgias that were  very to similar to pain other statins which she is intolerant to.  She still  is complaining of some lower extremity pain stemming from her hips going  down mostly into her feet and bottom of her feet.  Her primary physician is  referring her to an orthopedist secondary to this pain.  This is a different  pain than she has whenever she is on statin drugs and Zetia.  That caused  more muscle weakness, tenderness and soreness across her arms and shoulders,  and also down through the muscles of her thighs and into her calves.  She  exercises by going to cardiac rehabilitation three times a week.  She is  looking for a plan of followup for exercise routine after she is done  with  cardiac rehabilitation at the end of October.  She follows a fairly heart  healthy diet, occasionally snacks.  She drinks 1-2 cups of tea a day, and is  hydrating herself with water fairly well.  After a lengthy discussion with  [name] about her goals for her lipid panel, her total cholesterol above  goal at less than 200, triglycerides above goal at less than 150, HDL just  slightly under goal of greater than 40, and LDL greater than goal of less  than 70.   PLAN:  Our plan is to begin Welchol 625 mg two tablets twice daily with her  meals and increase to three tablets twice daily with meals as she tolerates  the Welchol.  I also, at the same time, will begin Niaspan 500 mg at bedtime  to aid with her triglycerides and her LDL.  Ultimately, she may need to be  on Tricor, as well, to bring down her triglycerides, but, given  her  intolerances in the past, I am going to start slowly and increase as Ms.  [name] tolerates.  1. As stated above, begin Niaspan 500 mg daily.  2. Begin Welchol 625 mg two tablets twice daily.  3. Continue exercise.  4. Continue a heart healthy diet.  5. Followup visit in 3 months for a lipid panel and LFT's and make      adjustments at that time.      ______________________________  Leota Sauers, PharmD    ______________________________  Jesse Sans. Daleen Squibb, MD, [facility]    LC/MedQ  DD:  11/28/2005  DT:  11/29/2005  Job #:  [id number]

## 2010-07-13 NOTE — Discharge Summary (Signed)
NAMEDESERAY, DAPONTE            ACCOUNT NO.:  [id number]   MEDICAL RECORD NO.:  [id number]          PATIENT TYPE:  INP   LOCATION:  3737                         FACILITY:  [facility]   PHYSICIAN:  [name]. Eden Emms, MD, FACCDATE OF BIRTH:  May 13, 1935   DATE OF ADMISSION:  10/23/2005  DATE OF DISCHARGE:  10/24/2005                                 DISCHARGE SUMMARY   DATE OF BIRTH:  [birth year]   DATE OF ADMISSION:  October 23, 2005   DATE OF DISCHARGE:  October 24, 2005   MAIN CARDIOLOGIST:  [name]. Eden Emms, MD, [facility]   PRIMARY CARE PHYSICIAN:  Dr. [name].   GASTROENTEROLOGIST:  Vania Rea. Jarold Motto, MD, [facility], FACP   REASON FOR ADMISSION:  Chest discomfort.   DISCHARGE DIAGNOSES:  1. Chest pain, etiology unclear.  2. Coronary artery disease.      a.     Status post drug-eluting stent (Cypher) x2 to the left anterior       descending (proximal and mid) June of 2007.      b.     Re-look catheterization August 23, 2005, patent stents.      c.     Good left ventricular function with an ejection fraction of 60%.  3. Hypertension.  4. Hyperlipidemia.      a.     Intolerant to statins.      b.     Zetia discontinued secondary to myalgias this admission.  5. Obesity.  6. Duodenitis and gastritis.      a.     Status post esophageal dilatation recently.  7. QUESTIONABLE PLAVIX ALLERGY.      a.     Currently tolerating rechallenge of Plavix.   BRIEF HISTORY:  [name] is a very pleasant 75 year old female patient  followed by Dr. Eden Emms, who has the above noted problem list, who presented  to the emergency room at [facility] with complaints of knot-like  substernal chest pressure.  She had complained of some intermittent  substernal chest pressure with a knot-like sensation radiating through to  her back with mild hot sensations since her PCI.  She had had a re-look  catheterization, as noted above, that revealed patent stents.  She was  admitted to [facility] for  further evaluation and treatment.   HOSPITAL COURSE:  The patient was continued on her home medications.  Serial  enzymes were checked.  These were negative x3.  Her pain was different from  her previous angina.  She was seen by Dr. Eden Emms October 24, 2005, who felt  she could be discharged to home in stable condition.   The patient has had problems tolerating statins in the past.  She had  recently complained of some leg aching.  Therefore, Zetia was continued.   The patient also had a history of a rash.  This was felt to be secondary to  Plavix at some time in the past.  However, she has been back on Plavix for  approximately a week and is tolerating it thus far.  Therefore, she will be  continued on Plavix.  LABS AND ANCILLARY DATA:  White count 6400, hemoglobin 12, hematocrit 35.3,  platelet count 334,000.  Sodium 140, potassium 4.3, chloride 102, CO2 29,  glucose 93, BUN 9, creatinine 0.8, total bilirubin 0.5, alkaline phosphatase  90, AST 19, ALT 16, total protein 6.2, albumin 3.5, calcium 9.3.  Cardiac  enzymes negative x3.  Chest x-ray:  No acute findings.   DISCHARGE MEDICATIONS:  1. Plavix 75 mg daily.  2. Aspirin 325 mg daily.  3. Toprol-XL 50 mg daily.  4. Nexium 40 mg daily.  5. Nitroglycerin p.r.n. chest pain.  6. Zyrtec 10 mg daily.  7. Caltrate 2 tablets twice a day.  8. Atenolol eye drops.  9. Nasacort b.i.d.  10.Glucosamine.  11.Vitamin B12.  12.Vitamin C.   The patient has been advised to stop taking her Zetia.   DIET:  Low-fat, low-sodium diet.   ACTIVITY:  She is to increase her activity slowly.   WOUND CARE:  Not applicable.   FOLLOWUP:  The patient will see Dr. Eden Emms November 07, 2005, at 9:45 a.m.  She can follow up with Dr. [name] as needed.  She has been advised it is okay  to continue with cardiac rehab.   Total physician PA time greater than 30 minutes.     ______________________________  [name] Newcomer, PA-C     ______________________________  [name]. Eden Emms, MD, [facility]    SW/MEDQ  D:  10/24/2005  T:  10/24/2005  Job:  [id number]   cc:   [name]. [name], MD  Vania Rea. Jarold Motto, MD, [facility], [place]

## 2010-07-13 NOTE — Cardiovascular Report (Signed)
NAMEALBINA, [name] NO.:  [id number]   MEDICAL RECORD NO.:  [id number]          PATIENT TYPE:  INP   LOCATION:  2916                         FACILITY:  [facility]   PHYSICIAN:  Charlies Constable, M.D. [facility] DATE OF BIRTH:  1935-12-30   DATE OF PROCEDURE:  08/23/2005  DATE OF DISCHARGE:                              CARDIAC CATHETERIZATION   HISTORY:  Ms. Lowe is 75 years old and last week had intervention by Dr.  [name] with placement of tandem non-overlapping Cypher stents in the ostium  and proximal LAD.  There was a problem with filling defect in the proximal  stent and Dr. [name] kept the patient on Integrilin for an extended period  of time.  She was readmitted today with recurrent chest pain and was seen by  Dr. Eden Emms and scheduled for repeat evaluation with angiography.   PROCEDURE:  Procedure was performed by the right femoral artery initially  with 5-French preformed coronary catheters.  After completion of the  diagnostic study using a FL-3.5 catheter, we had some concern about the  ostial lesion in the LAD proximal to the stent.  For this reason, we decide  to perform IVUS.   The patient was given Angiomax bolus and infusion.  We used a 6-French FL-3  guiding catheter.  We crossed the ostial lesion of the LAD with a Luge wire  without difficulty and advanced the wire into the diagonal branch.  We then  advanced an Atlantis catheter into the diagonal branch and passed the first  stent and did an automatic pullback.  We were certain to be sure that the  guiding catheter was outside the ostium of the LAD.  Final diagnostic study  was then performed with the guiding catheter.  The patient tolerated the  procedure well and left the laboratory in satisfactory condition.   RESULTS:  Angiographically, the stents in the ostium and proximal LAD were  widely patent with 0% stenosis.  Just proximal to the proximal stent at the  ostium of the LAD there appeared to be  narrowing of about 40-50%.   The IVUS measurements of this ostial narrowing showed a lumen dimension of  2.1 x 2.0.  This compared to a reference lumen of 2.8 x 2.8 distal to the  proximal stent and a stent lumen of 2.4 x 2.4.   Based on all these findings, we did not think the ostial lesion was  significant.   CONCLUSION:  No evidence of restenosis at either the stent sites either in  the proximal ostium of the left anterior descending artery with 40-50%  ostial stenosis proximal to the ostial stent.   RECOMMENDATIONS:  Reassurance and continued secondary risk factor  modification.  The patient will be at some risk for restenosis at the ostium  of the left anterior descending artery.           ______________________________  Charlies Constable, M.D. [facility]     BB/MEDQ  D:  08/23/2005  T:  08/24/2005  Job:  [id number]   cc:   Charlton Haws, M.D.  [address]  [address]  [address]  [place] [address]   [name]. [name], M.D. [facility]  [address]  [place]  [place] [address]   [name]. [name], M.D. [facility]  [address]  [address]  [place]  [place] [address]   Cardiopulmonary Lab

## 2010-07-13 NOTE — H&P (Signed)
[name], [name] NO.:  [id number]   MEDICAL RECORD NO.:  [id number]          PATIENT TYPE:  INP   LOCATION:  1824                         FACILITY:  [facility]   PHYSICIAN:  Jonelle Sidle, M.D. LHCDATE OF BIRTH:  27-Jul-1935   DATE OF ADMISSION:  08/22/2005  DATE OF DISCHARGE:                                HISTORY & PHYSICAL   PRIMARY CARE PHYSICIAN:  [name]. [name], M.D.   PRIMARY CARDIOLOGIST:  Charlton Haws, M.D.   PATIENT PROFILE:  A 75 year old white female with prior history of CAD,  status post recent stenting of the LAD x2, who presents with recurrent chest  discomfort.   PROBLEM LIST:  1.  Unstable angina/coronary artery disease.      1.  August 12, 2005 cardiac catheterization; left main, left circumflex          and RCA were normal.  The LAD had proximal and mid stenoses          requiring stenting, with placement of a 2.5 x 18 mm and 2.5 x 13 mm          Cypher drug-eluting stents.      2.  August 14, 2005 re-look cardiac catheterization, revealing patency of          previously placed LAD stent.  2.  Hypertension.  3.  Hyperlipidemia.  4.  Obesity.  5.  History of duodenitis and gastritis.  6.  Normal left ventricular function, EF 60% by catheterization.   HISTORY OF PRESENT ILLNESS:  A 75 year old white female with history of CAD,  status post PCI and stenting of the proximal and mid LAD with Cypher drug  eluting stents; complicated by thrombus formation of the proximal stent,  requiring bail-out 2B3A therapy x24 hr with re-look cardiac catheterization  on August 14, 2005.  This revealed patency of the LAD stents.  She was  discharged August 16, 2005 and had done well until this morning, when she woke  feeling washed out, and then went for a walk without any limitations or  symptoms.  At approximately 12 p.m. today she had sudden onset of 6/10  substernal chest pressure without associated symptoms (similar to previous  angina).  This persisted  for the better of an hour, and then she sat down to  eat.  While she was eating she had worsening of chest pressure to 9/10.  This persisted for about an hour, and she took a sublingual nitroglycerin  which did not change her symptoms.  Approximately 35 min later she took a  second sublingual nitroglycerin, which again had no effect.  At that point  she called into our office, and was advised to present to the [facility]  Emergency Department for further evaluation.  She currently complains of  5-  6/10 chest discomfort, but otherwise looks comfortable.   The first set of cardiac markers were negative.  An ECG shows 0.5-1.0 mm ST  segment depression in II, III aVF, and V4-V6.  There is no evidence of ST  elevation.  She denies any PND, orthopnea, dizziness, syncope, edema,  dyspnea or early  satiety.   ALLERGIES:  PENICILLIN, STATIN intolerance.   HOME MEDICATIONS:  1.  Timolol 0.5% O.U. b.i.d.  2.  Aspirin 325 mg q.d.  3.  Caltrate 600 3 tablets b.i.d.  4.  Hyocyamine.  5.  Nasocort 2 sprays b.i.d.  6.  Nexium 40 mg q.d.  7.  Plavix 75 mg q.d.  8.  Toprol XL 50 mg q.d.  9.  B-12 vitamin daily.  10. Zetia 10 mg q.d.  11. Zyrtec 10 mg q.d.  12. Zegerid.   FAMILY HISTORY:  Mother died with CAD at age 61.  Father is alive at age 6,  with a history of CAD and CABG.  She has one brother who has a history of  CAD.   SOCIAL HISTORY:  She lives in [place] with her husband.  She is retired.  She has never smoked cigarettes; does not use alcohol or drugs and walks for  exercise.   REVIEW OF SYSTEMS:  Positive for chest pain, infrequent diarrhea, GERD.  All  other systems reviewed are negative.   PHYSICAL EXAMINATION:  VITAL SIGNS:  Temperature 98.4, heart rate 81,  respirations 16, blood pressure 160/72, pulse oximetry 97% on 2 liters.  GENERAL:  A pleasant white female in no acute distress; awake, alert and  oriented x3.  NECK:  No bruits or JVD.  LUNGS:  Respiration is  regular and unlabored.  Clear to auscultation.  CARDIAC:  Regular S1, S2; no S3 or S4 murmurs.  ABDOMEN:  Round, soft, nontender and nondistended.  Bowel sounds present x4.  EXTREMITIES:  Warm, dry, pink.  No clubbing, cyanosis or edema.  Dorsalis  pedis, posterior tibial pulses 2+ and equal bilaterally.  Bilateral groin sites are clear of bleeding, bruise or hematoma.   CHEST X-RAY:  Pending.   EKG:  Shows sinus rhythm at a rate of 80, with 0.5 to 1 mm ST segment  depression in leads II, III, aVF, V4-V6.   LAB WORK:  Hemoglobin 12.6, hematocrit 37.0, sodium 137, potassium 3.8,  chloride 105, CO2 27.3, BUN 12, creatinine 0.7, glucose 92.  CK-MB 1.3,  troponin I less than 0.5.   ASSESSMENT AND PLAN:  1.  UNSTABLE ANGINA/CORONARY ARTERY DISEASE.  She is status post stenting of      the LAD 10 days ago, and now presents with chest pain similar to      previous angina.  Cardiac markers are negative x1.  ECG with subtle      inferolateral ST segment depression, which is new compared to EKG on      August 13, 2005.  Plan to admit, cyclic cardiac markers, add heparin and      nitroglycerin.  Continue aspirin, Plavix, Zetia and beta blocker.  Will      try a GI cocktail here in the ER, as well as IV morphine.  Continue PPI      and plan catheterization in the a.m.; or sooner if she has persistent      chest pain or rules in during the night.   1.  HYPERTENSION.  Blood pressure is currently elevated.  Will continue to      follow and titrate beta blocker as tolerated.  Of note, her blood      pressure ran low on her last hospitalization, with dizziness resulting      in down titration of Toprol.   1.  HYPERLIPIDEMIA. She is statin intolerant.  Started on Zetia on discharge      of  August 16, 2005.   1.  HISTORY OF GASTRITIS.  Continue PPI.      Ok Anis, NP      Jonelle Sidle, M.D. [facility] Electronically Signed    CRB/MEDQ  D:  08/22/2005  T:  08/22/2005  Job:   [id number]   cc:   [name]. [name], M.D. [facility]  [address]  [place]  [place] [address]   Charlton Haws, M.D.  [address]  [address]  [place]  [place] [address]

## 2010-07-13 NOTE — Cardiovascular Report (Signed)
[name], [name]            ACCOUNT NO.:  [id number]   MEDICAL RECORD NO.:  [id number]          PATIENT TYPE:  OIB   LOCATION:  1962                         FACILITY:  [facility]   PHYSICIAN:  Charlton Haws, M.D.     DATE OF BIRTH:  [birth year]   DATE OF PROCEDURE:  DATE OF DISCHARGE:                              CARDIAC CATHETERIZATION   PROCEDURE:  Arteriography.   INDICATION:  Symptoms consistent with unstable angina.   __________ catheterization done with 4-French catheters from right femoral  artery.   The patient had significant high blood pressure with systolics greater than  220.  She was given IV enalapril, labetalol and IV nitro in the lab.   The left main coronary artery was a very short segment and was normal.  The  patient may have had side-by-side ostia of the LAD circumflex.  We engaged  the left dominant circumflex with the JL-4 catheter.  The LAD was engaged  with a JL-3.5 catheter.   The LAD had a 90% stenosis proximally and in the mid vessel after the first  diagonal branch.  The second stenosis was more tubular.  Distal flow was  normal.  The patient did have significant chest pain with the injection.   The left circumflex was dominant.  It was large.  It was normal with PDA and  two large obtuse marginal branches.   The right coronary artery was small, nondominant and normal.   Ventriculography was normal.  EF was 60%.  There was no across aortic valve.  No MR.  There was mild aortic root enlargement but no evidence of  dissection.   IMPRESSION:  The patient has severe single-vessel disease.  I think that the  LAD is amenable to angioplasty and stenting since the patient had  significant high blood pressure and pain with injection.  We will see if Dr.  [name] can do her in the inpatient lab today.  If she cannot have her  angioplasty today.  We will admit her on IV nitroglycerin and heparin  proceed in the morning.     ______________________________  Charlton Haws, M.D.    PN/MEDQ  D:  08/12/2005  T:  08/13/2005  Job:  [id number]   cc:   [name]. [name], M.D. [facility]  [address]  [place]  [place] [address]

## 2010-07-13 NOTE — H&P (Signed)
NAMECAYLEI, [name] NO.:  [id number]   MEDICAL RECORD NO.:  [id number]          PATIENT TYPE:  INP   LOCATION:  1846                         FACILITY:  [facility]   PHYSICIAN:  Rollene Rotunda, MD, FACCDATE OF BIRTH:  1936-01-01   DATE OF ADMISSION:  10/23/2005  DATE OF DISCHARGE:                                HISTORY & PHYSICAL   PRIMARY CARDIOLOGIST:  Dr. Charlton Haws   PRIMARY CARE PHYSICIAN:  Dr. Alroy Dust   PRIMARY GASTROENTEROLOGIST:  Dr. Jarold Motto   PATIENT PROFILE:  This is a 75 year old married white female with prior  history of CAD with stenting of the LAD with two Cypher drug-eluting stents  in June 2007 who presents with a knot-like chest pressure.   PROBLEMS:  1. Coronary artery disease.      a.     June 2007 cardiac catheterization revealing a greater than 90%       stenosis in the proximal mid left anterior descending lesion but       otherwise nonobstructive coronary disease.  The left anterior       descending was successfully stented with a 2.5 x 18 mm Cypher drug-       eluting stent in the midsection of the left anterior descending and a       2.5 x 13 mm Cypher drug-eluting stent was placed in the proximal left       anterior descending.      b.     August 23, 2005 cardiac catheterization widely patent left       anterior descending stents and otherwise nonobstructive disease.  2. Hypertension diagnosed about 5 years ago.  3. Hyperlipidemia diagnosed 5 years ago.  4. Obesity.  5. Duodenitis and gastritis.      a.     EGD approximately 2 weeks ago with esophageal dilatation       performed by Dr. Jarold Motto.  6. QUESTION PLAVIX ALLERGY.      a.     Patient developed a rash while on Plavix as well as Zegerid, was       briefly placed on Ticlid therapy and has now been back on Plavix for       the past week.  7. STATIN INTOLERANCE.      a.     Caused leg aches.  8. Glaucoma.  9. Seasonal allergies.   HISTORY OF PRESENT ILLNESS:  This  is a 75 year old married white female with  history of CAD status post PCI and stenting of the proximal mid LAD in June  2007 with Cypher drug-eluting stents and subsequent re-look catheterizations  that same week as well as August 23, 2005 with patent stents on both  occasions.  Since her PCI she has had intermittent exertional 0.5 to 1/10  focal substernal chest pressure a knot-like sensation radiating through her  back with mild hot sensation occurring several times per week.  Symptoms  are sometimes associated with belching and relieved or ease up to some  extent with slowing of pace but sometimes will last all day.  This knot-like  sensation is different than  her previous anginal equivalent which was  substernal chest burning.  Most notably, this knot-like sensation does not  significantly limit her activities during the day.  This morning she awoke  at approximately 7 a.m. and noted this 1/10 knot-like sensation and also  felt fairly fatigued.  She took a sublingual nitroglycerin and had some  easing up of this knot-like pressure to approximately 0.5/10.  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  She then presented to [facility] at 1:15 p.m. and while  walking on the treadmill was noted to be hypertensive with a systolic blood  pressure of 170.  When the technician came over to recheck her blood  pressure, she mentioned that she had been having this 0.5 to 1/10 knot-like  pressure over the past several weeks and since 7 o'clock this morning.  At  that point the treadmill was stopped and she came off the treadmill and was  given a sublingual nitroglycerin.  Her pressure decreased from 170 systolic  to 140 and her discomfort was maintained at 0.5/10.  She was given an   additional sublingual nitroglycerin and then our office was contacted and  she was advised to present to the [facility] ED.  Here in the ED her EKG was  without any acute changes and she continues to report a 0.5/10 discomfort in  the center of her chest.  She says, it's just there, but otherwise appears  comfortable.  She denies any PND, orthopnea, dizziness, syncope, edema or  early satiety.  As noted above she denies any significant limitations in  activities or dyspnea on exertion.   ALLERGIES:  SHE HAS INTOLERANCE TO ALL STATINS CAUSING LEG ACHING.  AS NOTED  IN THE PROBLEM LIST SHE WAS TAKING PLAVIX AND ZEGERID AT THE SAME TIME  FOLLOWING HER PCI AND DEVELOPED A RASH.  She was taken off of both  medications for approximately a 2-week period and was placed on Ticlid while  she was off of Plavix.  Once the rash resolved, she was placed back on  Plavix about 1 week ago and has not had any recurrent rash.  It is not clear  that this is a true allergy to Plavix or not.   HOME MEDICATIONS:  1. Timolol eye drops.  2. Caltrate two tablets b.i.d.  3. Nexium 40 mg daily.  4. Toprol XL 50 mg daily.  5. Aspirin 325 mg daily.  6. Zyrtec 10 mg daily.  7. Vitamin B12 daily.  8. Vitamin C daily.  9. Nasacort b.i.d.  10.Glucosamine daily.  11.Zetia 10 mg daily.  12.Nitroglycerin 0.4 mg sublingual p.r.n. chest pain.  13.Plavix 75 mg daily added back last week.   FAMILY HISTORY:  Mother died with a history of coronary disease at age 14.  Father is alive at age 75.  He has a history of CEA and CABG.   SOCIAL HISTORY:  She lives in [place] with her husband.  She works as a  Astronomer.  She is married and has two daughters.  She denies any  tobacco, alcohol or drug use in her lifetime.  She walks at cardiac rehab 3  days a week.   REVIEW OF SYSTEMS:  Positive for fatigue since June 2007, also positive for  chest pressure as outlined in the HPI.  She has recently developed leg  aching  in the past 2 weeks which she attributes to Zetia therapy which was  started just over a month ago.  She also has GERD-like symptoms with a  history of esophageal dilatation about 2 weeks ago.  All other systems  reviewed and negative.   PHYSICAL EXAMINATION:  VITAL SIGNS:  She is afebrile, heart rate 68,  respirations 18, blood pressure is 153/83, pulse oximetry 100% on 2 liters.  GENERAL:  Pleasant white female in no acute distress.  Awake, alert and  oriented x3.  NECK:  Normal carotid upstrokes.  No bruits or JVD.  LUNGS:  Respirations regular and unlabored.  CARDIAC:  Regular S1 and S2, no S3-S4 or murmurs.  ABDOMEN:  Round, soft, nontender, nondistended.  Bowel sounds present x4.  EXTREMITIES:  Warm, dry, pink.  No clubbing, cyanosis or edema.  Dorsalis  pedis and posterior tibial pulses 2+ and equal bilaterally.   X-RAYS:  Chest x-ray is pending.   ELECTROCARDIOGRAM:  EKG shows sinus rhythm with a rate of 66, no acute ST-T  changes and a normal axis.   LABORATORY WORK:  Hemoglobin 12.9, hematocrit 38.0.  Sodium 136, potassium  4.0, chloride 104, CO2 30.1, BUN 8, creatinine 0.7, glucose 96.  CK-MB less  than 1.0 and troponin I less than 0.5.   ASSESSMENT AND PLAN:  1. Chest pain with history of coronary artery disease.  She has both      typical and atypical features.  Symptoms have been present      intermittently usually with exertion since percutaneous coronary      intervention of the left anterior descending in June 2007.  Symptoms      are sometimes associated with belching and generally do not limit      activity.  Present symptoms are different from her previous anginal      equivalent that she describes as chest burning prior to her stent      placement.  Plan to admit and cycle cardiac markers.  ECG is without      acute changes.  Continue her aspirin, her Plavix and beta blocker.  We      will hold her Zetia at this time given recent leg aching to see if       these aches improve.  Provided that cardiac markers are negative likely      no additional cardiac workup at this time.  2. Hypertension.  Blood pressure was markedly elevated down here in the ER      at 199/98.  She was treated with 5 mg of IV Lopressor bringing her down      to 153/83.  We placed her on an IV nitroglycerin drip to bring her down      and we will also add captopril which we can titrate q.8 h.  We will      increase her Toprol dose to 100 mg daily.  3. Hyperlipidemia.  She is on Zetia at home but has noted leg aching over      the past 2 weeks.  She had similar reaction to statins.  Hold Zetia and      assess from treatment.  4. QUESTIONABLE PLAVIX ALLERGY.  As noted above she developed a rash      approximately 2 weeks after Plavix initiation.  She was also on Zegerid      at the time.  Her Plavix  was switched to Ticlid and now back to Plavix     for the past week or so and so far she is tolerating this well.  We      will continue to follow and assess for any rashes.  5. History of gastritis, duodenitis and esophageal stricture.  This is      followed by Dr. Jarold Motto with gastroenterology.  She is recently      status post esophageal dilatation.  Question if this may be      contributing to symptoms.  6. Obesity.  She is currently enrolled in cardiac rehab which she enjoys      doing.     ______________________________  Nicolasa Ducking, ANP    ______________________________  Rollene Rotunda, MD, [facility]    CB/MEDQ  D:  10/23/2005  T:  10/23/2005  Job:  [id number]

## 2010-07-13 NOTE — Cardiovascular Report (Signed)
[name], SAUTTER            ACCOUNT NO.:  [id number]   MEDICAL RECORD NO.:  [id number]          PATIENT TYPE:  INP   LOCATION:  2911                         FACILITY:  [facility]   PHYSICIAN:  [name]. [name], M.D. [facility] OF BIRTH:  1935-10-20   DATE OF PROCEDURE:  08/12/2005  DATE OF DISCHARGE:                              CARDIAC CATHETERIZATION   INDICATIONS:  The patient is a 75 year old, who has been followed in the  lipid clinic.  She recently presented with exertional angina that sounds  like class II or class III.  She was seen in the clinic by Dr. Charlton Haws.  The patient is on daily aspirin, as well as lipid lowering agents.  Dr.  Eden Emms brought the patient to the catheterization laboratory and she  underwent catheterization demonstrating tandem high-grade lesions in the  left anterior descending artery.  He recommended percutaneous coronary  intervention.  Her sheath was sewn into place.  The patient was given  intravenous heparin.  I discussed the case with the patient in the holding  area.  I recommended re-look to lay out the proximal LAD to see if that was  treatable.  Options were discussed including revascularization surgery and  percutaneous intervention, we also discussed the issues of stenting with a  drug-eluting versus non-drug-eluting platform.  She was brought to the  laboratory for further evaluation.   PROCEDURE:  The patient was brought to the cath lab and prepped and draped  in the usual fashion.  Using double glove technique, the indwelling 4-French  sheath was exchanged for a 6-French sheath.  A bivalve __________ was given  according to protocol.  We initially were administering clopidogrel, but the  patient developed some belching and the pills became stuck.  She was  eventually able to get these down.  A JL-3 O guiding catheter was then  subsequently utilized.  ACT was checked and found to be in excess of 300  seconds.  A Prowater wire was then  taken down the artery, and pre-  dilatations done with a 2.25-mm Maverick balloon.  We then passed a 2.5 x 18  Cypher drug-eluting stent.  A second dose of oral clopidogrel was  administered.  The drug-eluting stent was felt to be too long for the  proximal lesion, so subsequently passed down into the distal lesion, and the  distal lesion was stented up to about 14-15 atmospheres with a 2.5 x 18  Cypher stent.  We then reviewed whether or not we could place a stent in the  proximal lesion with a drug-eluting platform given the vessel size.  A 2.5 x  13 Cypher was then passed to the lesion site and deployed.  This was  deployed between 13 and 15 atmospheres.  Following this, a 2.75 Quantum  Maverick was placed down to the distal stent and post dilatations done.  Post dilatations were then done in the proximal stent.  There was some  evidence after this of  proximal haziness,  and I was not sure immediately  whether or not there was tissue prolapse, incomplete stent expansion and/or  thrombus formation.  We initially placed a 2.5 Quantum Maverick short  balloon and this was taken up to nearly 20 atmospheres.  This was taken down  and there continued to be a somewhat hazy appearance.  We also deployed an  [employer] [place] 2.5 Ranger, and this appeared to be fully expanded with a haziness, I had  the cardiac surgeons called to the catheterization laboratory.  We went  ahead and administered bailout eptifibatide and re-dilatations were  performed.  Additional Plavix was administered, but the patient had a fair  amount of nausea following this, there was some improvement in the  appearance of the artery and haziness predominantly resolved.  Zofran was  given for nausea and vomiting occurred with vomiting of the patient's Plavix  pill.  We elected to keep the bivalve __________  running with the  eptifibatide for approximately 1 hour and continue the eptifibatide after  this.  I elected to go ahead and perform  intravascular ultrasound.  Ultrasound was performed without difficulty.  The stent proximally appeared  to be well expanded.  At the very proximal end, there was evidence of some  linear thrombus formation, but the lumen was widely patent and there did not  appear to be excessive of clot formation.  With this, we elected to complete  the procedure.  All catheters were removed.  The femoral sheath was sewn  into place and she was taken to the holding area with relief of her chest  pain.   ANGIOGRAPHIC DATA:  The left anterior descending artery demonstrates about  an 80-90% hazy stenosis proximal, followed by a high-grade stenosis just  distal to the diagonal.  The distal stenosis following stent deployment was  widely patent with an excellent angiographic result.  The proximal stent was  deployed up to the takeoff from the left main.  There was a very tiny shelf  of residual LAD.  The stent was also reduced to 0% narrowing, but there was  some filling defect, which eventually resolved after administration of  eptifibatide.   CONCLUSIONS:  1.  Successful percutaneous stenting of the mid left anterior descending      artery.  2.  Successful percutaneous stenting of the proximal left anterior      descending with postprocedure clot formation, resolved with bailout      eptifibatide.   DISPOSITION:  The patient will be treated with oral clopidogrel.  She had  difficulty tolerating this in the laboratory, although she is on daily  aspirin.  I will recommend that we bring her back to the catheterization  laboratory for re-look in approximately 48 hours to document continued  patency off of eptifibatide.      [name]. [name], M.D. [facility]  Electronically Signed     TDS/MEDQ  D:  08/12/2005  T:  08/13/2005  Job:  [id number]

## 2010-07-13 NOTE — Telephone Encounter (Signed)
Pt. Aware for Nitroglycerin 0.4 mg sl order was send to [facility] pharmacy.

## 2010-07-13 NOTE — Assessment & Plan Note (Signed)
[facility]                              CARDIOLOGY OFFICE NOTE   NAME:[name], [name]                   MRN:          [id number]  DATE:11/29/2005                            DOB:          10-02-1935    SUBJECTIVE:  [name] presents today for a followup.  She is status post  angioplasty and stenting of the mid LAD.  She was re-hospitalized soon after  that for recurrent chest pain but her stent was widely patent.  She has been  continuing with cardiac rehabilitation which ends in a week.  The patient  initially had a reaction to medication.  We could not tell if it was Plavix  or not.  She was on Ticlid for a while.  She is back on Plavix now without  any problems.  She did not tolerate statins due to severe leg pain.  She is  on Niaspan and Welchol with an LDL cholesterol of 160.   She is not having any significant chest pain.  She needs to get her  medications generically through mail-order now and we wrote her  prescriptions for 3 months at a time.  I explained to her there was not a  generic for Plavix, and she could get over-the-counter proton pump  inhibitors rather than Nexium.   Her review of systems is remarkable for no significant PND, orthopnea, chest  pain, palpitations, or syncope.   OBJECTIVE:  VITAL SIGNS:  Blood pressure is 129/70, pulse 70 and regular.  LUNGS:  Clear.  CARDIOVASCULAR:  Carotids are normal.  There is an S1 and S2, with a soft  systolic murmur.  ABDOMEN:  Benign.  EXTREMITIES:  Lower extremities have good pulses, no edema.   IMPRESSION:  Stable coronary disease with stenting of LAD on aspirin and  Plavix, no recurrent angina.  Continue cardiac rehabilitation.  On Welchol and Niaspan for  hypercholesterolemia, intolerant to statins.   Continue aspirin, Plavix and beta blocker therapy.  Follow up in 6 months.            ______________________________  [name]. Eden Emms, MD, [facility]     PCN/MedQ  DD:   11/29/2005  DT:  12/02/2005  Job #:  [id number]

## 2010-07-13 NOTE — Discharge Summary (Signed)
NAMEVIEVA, BRUMMITT            ACCOUNT NO.:  [id number]   MEDICAL RECORD NO.:  [id number]          PATIENT TYPE:  INP   LOCATION:  3735                         FACILITY:  [facility]   PHYSICIAN:  Charlton Haws, M.D.     DATE OF BIRTH:  [birth year]   DATE OF ADMISSION:  08/12/2005  DATE OF DISCHARGE:  08/16/2005                                 DISCHARGE SUMMARY   DICTATED BY:  Nicolasa Ducking, NP   PRIMARY CARDIOLOGIST:  Dr. Eden Emms.   PRIMARY CARE PHYSICIAN:  Dr. [name]   PRINCIPAL DIAGNOSIS:  Unstable angina/coronary artery disease.   OTHER DIAGNOSES:  1.  Hypertension.  2.  Hyperlipidemia.  3.  Statin intolerance.  4.  History of duodenitis and gastritis.   ALLERGIES:  INTOLERANCE TO STATIN AND ALLERGY TO PENICILLINS.   PROCEDURE:  Left heart cardiac catheterization with PCI and stenting of the  proximal and mid LAD and subsequent re-look cardiac catheterization on August 14, 2005.   HISTORY OF PRESENT ILLNESS:  A 75 year old married white female with no  prior history of CAD who recently saw Dr. Eden Emms in the clinic on August 08, 2005, secondary to progressive exertional chest discomfort.  A decision was  made at that point to pursue left heart cardiac catheterization.   HOSPITAL COURSE:  She was admitted August 12, 2005, and underwent left heart  cardiac catheterization which revealed a normal left main, a large dominant  normal left circumflex, a nondominant normal right coronary artery and a 98%  proximal stenosis and mid stenosis in the LAD.  EF was 60% without regional  wall motion abnormalities.  Her films were reviewed by Dr. [name], and the  decision was made to pursue PCI of the LAD.  A 2.5 x 18 mm Cypher drug-  eluting stent was placed in the mid LAD and a 2.5 x 13 mm Cypher drug-  eluting stent was placed in the proximal region.  Post diltation of the  proximal stent revealed haziness, and it was not clear if this was  dissection, incomplete stent expansion or a  thrombus.  Patient was placed on  integralin therapy and then intravascular ultrasound was performed,  revealing a small amount of linear thrombus formation at the proximal end of  the proximal stent, but otherwise a widely patent lumen with timi-3 flow.  She remained on integralin therapy for the subsequently 24 hours and did  have 1 episode of chest discomfort on June 19th that was improved with  oxygen and nitroglycerin.  She went back to the Cardiac Cath Lab on June  20th to reevaluate the proximal stent, and it was felt to be widely patent  with minimal irregularity proximally.  Integralin infusion was discontinued  and she has been ambulating the hallways without any recurrent discomfort or  limitation.   PLAN:  The plan is to discharge on the morning of June 22nd.   Secondary to the patient's previous intolerance to Statin therapy, she will  be placed on Zetia 10 mg daily at discharge.  She will follow up with our  Cardiology Lipid Clinic in July .  DISCHARGE LABS:  Hemoglobin 11.5, hematocrit 33.5, WBC 6.7, platelets 284,  sodium 139, potassium 3.4, chloride 104, CO2 30, BUN 7, creatinine 0.7,  glucose 103, calcium 8.8, CK 6, MB 2.1, troponin I 0.06.   DISPOSITION:  The patient will be discharged home June 22nd in satisfactory  condition.  Follow up plans and appointments:  She will follow up with Dr.  Charlton Haws on July 5th at 9:30 a.m.  She has a Lipid Clinic follow up  appointment July 12th at 1:30 p.m.   DISCHARGE MEDICATIONS:  Aspirin 325 mg daily, Plavix 75 mg daily, Atenolol  0.5% 1 drop each eye daily, Toprol-XL 50 mg 1-1/2 tablets daily, Nexium 40  mg daily, Zyrtec 10 mg daily, vitamin B12 daily, Nasacort 2 sprays b.i.d.,  Caltrate 3 tabs daily, nitroglycerin 0.4 mg sublingual p.r.n. chest pain,  Zetia 10 mg daily.   Outstanding labs:  None.   Duration of discharge:  40 minutes including physician time.           ______________________________  Charlton Haws, M.D.     PN/MEDQ  D:  08/15/2005  T:  08/15/2005  Job:  [id number]   cc:   [name]. [name], M.D. [facility]  [address]  [place]  [place] [address]

## 2010-07-13 NOTE — Discharge Summary (Signed)
NAMENEOSHA, SWITALSKI            ACCOUNT NO.:  [id number]   MEDICAL RECORD NO.:  [id number]          PATIENT TYPE:  OIB   LOCATION:  1962                         FACILITY:  [facility]   PHYSICIAN:  Ok Anis, NPDATE OF BIRTH:  November 22, 1935   DATE OF ADMISSION:  08/12/2005  DATE OF DISCHARGE:  08/12/2005                                 DISCHARGE SUMMARY   ADDENDUM:  The previously dictated discharge was job #[id number] dictated on  August 15, 2005.   Ms. Mertz developed some light-headedness and mild orthostasis with higher  dose of Toprol, and therefore, the dose was cut back to 50 mg daily.  She is  otherwise being discharged to home in satisfactory condition as previous  outlined.      Ok Anis, NP     CRB/MEDQ  D:  08/16/2005  T:  08/16/2005  Job:  [id number]

## 2010-07-13 NOTE — Discharge Summary (Signed)
[name], [name]            ACCOUNT NO.:  [id number]   MEDICAL RECORD NO.:  [id number]          PATIENT TYPE:  INP   LOCATION:  2916                         FACILITY:  [facility]   PHYSICIAN:  Jonelle Sidle, M.D. LHCDATE OF BIRTH:  1935-06-30   DATE OF ADMISSION:  08/22/2005  DATE OF DISCHARGE:  08/24/2005                                 DISCHARGE SUMMARY   PRIMARY CARE PHYSICIAN:  [name]. [name], M.D. [facility]   PRIMARY CARDIOLOGIST:  Charlton Haws, M.D.   PRINCIPAL DIAGNOSIS:  Chest pain.   OTHER DIAGNOSES:  1.  Coronary artery disease, status post stenting of the left anterior      descending artery with CYPHER drug-eluting stents, [id number].  2.  Hypertension.  3.  Hyperlipidemia.  4.  Obesity.  5.  Duodenitis and gastritis   ALLERGIES:  1.  PENICILLIN.  2.  STATIN intolerance.   PROCEDURES:  Left heart cardiac catheterization.   HISTORY OF PRESENT ILLNESS:  A pleasant white female with a history of CAD,  status post recent PCI and stenting of the proximal mid LAD with CYPHER drug-  eluting stent complicated by thrombus formation of the proximal stent  requiring bailout 2B3A therapy x24 hours with re-look catheterization on  June20,2007, revealing patency of LAD stents.  She was discharged  June22,2007, from [facility] and did well until 12 p.m. on June28,  2007, when she had sudden onset of 6/10 substernal chest pressure without  associated symptoms but similar to previous angina which worsened after  eating and persisted for several hours, despite 2 sublingual nitroglycerin  tablets.  Because of persistence of symptoms, she contacted our office and  was advised to present to the [facility] ED, where ECG showed 0.5-to-1-mm ST-  segment depression in leads II, III, and aVF as well as V4-V6 which was  somewhat worse than a previous ECG.  The decision was made at that point to  admit her for further evaluation.   HOSPITAL COURSE:  She ruled out for MI and  underwent left heart cardiac  catheterization on June29, 2007, revealing patent LAD stent with use of  intravascular ultrasound showing good opposition of stent stress.  She  remained on medical therapy and has not had any recurrent chest discomfort.  She is being discharged home today in satisfactory condition.   DISCHARGE LABORATORY:  Hemoglobin 11.5, hematocrit 33.7, WBC 7.2, platelets  336, MCV 88.8.  Sodium 131, potassium 3.6, chloride 497, CO2 25, BUN 5,  creatinine 0.8, glucose 134.  PT 13.5, INR 1.0.  Total bilirubin 0.7,  alkaline phosphatase 73, AST 19, ALT 16, albumin 3.7.  Cardiac markers  negative x3.  Total cholesterol 201, triglycerides 190, HDL 48, LDL 115,  calcium 8.8.    Chest x-ray on admission showed questionable mild abnormal density at the  right base with recommended 2-view chest x-ray in followup.   DISPOSITION:  The patient is being discharged home today in good condition.   FOLLOWUP PLANS/APPOINTMENTS:  1.  She has a followup appointment Dr. Eden Emms on [id number] 9:30 a.m.[name]  At      that time, she  should have repeat PA and lateral chest x-ray to re-      evaluate the right lung base.  2.  She is asked to follow up with Dr. [name] in 3-4 weeks.  3.  She has a with lipid clinic appointment on July12 at 1:30 p.m.   DISCHARGE MEDICATIONS:  1.  Aspirin 325 mg every day.  2.  Plavix 75 mg every day.  3.  Timolol 0.5%, 1 drop both eyes every day.  4.  Toprol XL 50 mg every day.  5.  Nexium 40 mg every day.  6.  Zyrtec 10 mg.  7.  Vitamin B12 every day.  8.  Nasacort 2 sprays inhaled b.i.d.  9.  Caltrate 3 tablets every day.  10. Zetia 10 mg every day.  11. Nitroglycerin 0.4 mg sublingual p.r.n. chest pain.   OUTSTANDING LABORATORY STUDIES:  None.   DURATION DISCHARGE ENCOUNTER:  Was 35 minutes including physician  time.      Ok Anis, NP      Jonelle Sidle, M.D. [facility]  Electronically Signed    CRB/MEDQ  D:  08/24/2005  T:   08/24/2005  Job:  [id number]   cc:   [name]. [name], M.D. [facility]  [address]  [place]  [place] [address]

## 2010-07-13 NOTE — Assessment & Plan Note (Signed)
[facility]                               LIPID CLINIC NOTE   NAME:[name]                   MRN:          [id number]  DATE:05/29/2006                            DOB:          20-Feb-1936    Return office visit for lipid clinic.   PAST MEDICAL HISTORY:  1. Hyperlipidemia.  2. Coronary artery disease status post stents x2 in her LAD.  3. Gastroesophageal reflux disease.  4. Fibromyalgia.  5. Diverticulosis.  6. Hypertension.  7. Glaucoma.  8. Irritable bowel disease.   MEDICATIONS:  1. Nexium 40 mg daily.  2. Toprol 50 mg daily.  3. Aspirin 325 mg daily.  4. Zyrtec 10 mg daily.  5. Vitamin B12 one tablet daily.  6. Vitamin C one tablet daily.  7. Calcium 1000 mg daily.  8. Timolol eyedrops.  9. Zetia 10 mg daily.  10.Plavix 75 mg daily.  11.Niaspan 500 mg daily.  12.Flax seed daily.  13.Fish oil 3 tablets daily.   VITAL SIGNS:  Weight was 130 pounds, blood pressure was 140/90 and heart  rate was 70.   LABORATORY DATA:  Total cholesterol 233, triglycerides 203, HDL 65, LDL  140. LFTs within normal limits.   ASSESSMENT:  [name] is a pleasant, 75 year old woman who returns to  the lipid clinic today with no muscles aches or pain and compliant with  medication regimen. However, she complaining of some chest discomfort.  She is unsure if it is epigastric pain versus cardiac pain. She will be  following up with her cardiologist next week. She said that her chest  discomfort comes and goes, it is not associated with any meals nor is  associated with any type of physical activity. She could be walking or  she also could be seated, it could be in the morning or evening. There  is no set routine to it. She follows a fairly heart healthy diet. She  eats fried fish twice a month when she goes out with friends. She eats  lots of baked chicken, vegetables and fruit and oatmeal for breakfast.  She is walking about 40 minutes 5  times a week. Although she continues  to have increased lipids, they are improved from last visit. She has  been intolerant to statins in the past. She is unwilling to retry any of  them. She had GI side effects from [facility] and is tolerating the Zetia  and the Niaspan just fine. However, she does have some hot flashes with  the Zetia. She is taking that after her evening meal instead of at  bedtime because with her epigastric pain, she is unable to tolerate a  325 aspirin without a large meal therefore she is taking everything with  her evening meal. She is willing to increase her exercise to most days  of the week being 5-6 days a week, she is willing to walk a minimum of 2  miles and increasing her 40 minutes up to 50-60 minutes to help aid in  her decrease of LDL and increase of HDL. Her LDL is greater than goal of  less 200, her triglycerides are greater than goal of less than 150, her  HDL is at goal of greater than 40 and her LDL is greater than goal of  less than 70. However these are improved from last visit. I have  encouraged [name] to continue her low fat diet and to increase her  exercise and at next visit after her chest discomfort is taken care of  then we will go on and either increase her Niaspan or change her  medications to help bring her lipid panel to goal.   PLAN:  1. Continue current medications for the time being.  2. Followup in 3 months for lipid panel and LFTs and will make      adjustments at that time.  3. Continue low fat diet.  4. Increase exercise regimen.      Leota Sauers, PharmD  Electronically Signed      Jesse Sans. Daleen Squibb, MD, [facility]  Electronically Signed   LC/MedQ  DD: 05/29/2006  DT: 05/29/2006  Job #: [id number]

## 2010-07-13 NOTE — Telephone Encounter (Signed)
[facility]  [address] [phone] needs refill of nitro

## 2010-07-13 NOTE — Assessment & Plan Note (Signed)
[facility]                              CARDIOLOGY OFFICE NOTE   NAME:[name], [name]                     MRN:          [id number]  DATE:09/05/2005                            DOB:          [birth year]    PAST MEDICAL HISTORY:  1.  Hyperlipidemia.  2.  Coronary heart disease.  3.  Status post stents x2 to her left anterior descending.  4.  Gastroesophageal reflux disease.  5.  Fibromyalgia.  6.  Diverticulosis.  7.  Hypertension.  8.  Glaucoma.  9.  Irritable bowel disease.   CURRENT MEDICATIONS:  1.  Timolol eye drops.  2.  Caltrate two tablets twice a day.  3.  Nexium 40 mg daily.  4.  Toprol 50 mg daily,  5.  Aspirin 325 mg daily.  6.  Zyrtec 10 mg daily.  7.  Vitamin B 12 daily.  8.  Vitamin C daily.  9.  Nasacort twice daily.  10. Glucosamine once daily.  11. Ticlid 250 mg twice daily.  12. Zetia 10 mg daily.   MEDICATION INTOLERANCES ARE ALL STATIN MEDICATIONS.   VITAL SIGNS:  Weight 126 pounds, blood pressure 118/68, heart rate 70.   LABORATORY DATA:  Total cholesterol 163, triglycerides 183, HDL 35, LDL 93.   ASSESSMENT:  [name] is a pleasant woman who returns to the lipid clinic  today with no chest pain, no shortness of breath.  She states she is  compliant with current medication regimen.  She is feeling so much better  after having the stents placed and less fatigued, less chest pain and  shortness of breath.  She is very relieved that she is doing well.  She had  tolerated Crestor  for several  months but then developed some myalgias.  We  re-started her at a lower dose.  She continued to  have myalgias and is now,  therefore, taken off Crestor. She is tolerating Zetia 10 mg well.  She does  understand that her total cholesterol goal is less  than 200, triglycerides  at a goal of less than 150,  HDL less that goal of greater than 40, LDL  greater than goal of less than 70.  She does realize  that we may need to  add medications, make changes in the future, however, I am going to continue  her on Zetia 10 mg daily, see how she  tolerates for the next few months.  She is willing to re-start exercise regimen.  She is walking very slowly  about 10 minutes in the morning and will add 10 minutes in the evening but  she is doing that every day.  She does follow a fairly heart-healthy diet  with occasional snacking on sweets.  We spent a lengthy time discussing  small changes in her diet that could help decrease the fats in her diet.   PLAN:  1.  Continue Zetia 10 mg daily.  2.  Re-start exercise regimen.  3.  Decrease fats in diet.  4.  Followup visit in three months for lipid panel and LFTs and we will make  medication changes that will be needed at that time.                                  Leota Sauers, PharmD    LC/MedQ  DD:  09/05/2005  DT:  09/05/2005  Job #:  [id number]

## 2010-07-16 ENCOUNTER — Telehealth: Payer: Self-pay | Admitting: Cardiovascular Disease

## 2010-07-16 NOTE — Telephone Encounter (Signed)
PT TOOK MEDS AND HAD A REACTION SHE WAS LIGHT HEADED/HOT/FLUSH/TREMBLE IN SIDE/AND SHE IS CONCERNED AND NEEDS TO TALK TO SOMEONE

## 2010-07-17 NOTE — Telephone Encounter (Signed)
RN attempted to call Pt, busy, x2 left message to call back.

## 2010-07-17 NOTE — Telephone Encounter (Signed)
Spoke with pt who reports she noticed a hot,flushed, trembling feeling Sun AM and Monday AM after taking metoprolol and Losartan HCTZ. She has been on these medicines for awhile and has not had these symptoms before. Felt better after about 2 hours.  Today she took metoprolol in the AM and waited until after lunch to take Losartan HCTZ.  She has not had any symptoms today.  I told pt it was OK to continue taking meds on this schedule as Losartan is a daily medicine.  She has no other complaints at this time and will call us if symptoms occur again.

## 2010-07-25 ENCOUNTER — Encounter (INDEPENDENT_AMBULATORY_CARE_PROVIDER_SITE_OTHER): Payer: Self-pay | Admitting: General Surgery

## 2010-07-30 ENCOUNTER — Other Ambulatory Visit: Payer: Self-pay | Admitting: Cardiovascular Disease

## 2010-07-30 ENCOUNTER — Telehealth: Payer: Self-pay | Admitting: Cardiovascular Disease

## 2010-07-30 NOTE — Telephone Encounter (Signed)
C/o side effect to medication- metoprolol, lopsartan, hctz,. Doesn't feel right.

## 2010-07-30 NOTE — Telephone Encounter (Signed)
I spoke with the patient. She called on 07/17/10 complaining about not feeling well on her medications. She divided up the metoprolol and losartan/hctz. She takes one in the am and the other after lunch. She is complaining today about feeling nervous, shaky, having a flushing feeling and just feeling "weird." She has been on both of these medications for several months. She states her bp is up and down. Yesterday she was 167/84 and then 143/67 a little later. Her HR runs in the 60's. This morning her bp was 114/64. I explained to her that I am not sure what the cause of her symptoms are, but it may be hard to sort this out over the phone. She will come on 08/10/10 to see Dr. Eden Emms for an office visit. I will forward the message to him in the interim to see if he has any recommendations. The patient is agreeable.

## 2010-08-06 ENCOUNTER — Emergency Department (HOSPITAL_BASED_OUTPATIENT_CLINIC_OR_DEPARTMENT_OTHER)
Admission: EM | Admit: 2010-08-06 | Discharge: 2010-08-07 | Disposition: A | Discharge status: Other Acute Inpatient Hospital | Payer: Medicare Other | Source: Home / Self Care | Attending: Emergency Medicine | Admitting: Emergency Medicine

## 2010-08-06 ENCOUNTER — Emergency Department (INDEPENDENT_AMBULATORY_CARE_PROVIDER_SITE_OTHER): Payer: Medicare Other

## 2010-08-06 DIAGNOSIS — R079 Chest pain, unspecified: Secondary | ICD-10-CM

## 2010-08-06 DIAGNOSIS — I517 Cardiomegaly: Secondary | ICD-10-CM

## 2010-08-06 LAB — COMPREHENSIVE METABOLIC PANEL
ALT: 21 U/L (ref 0–35)
AST: 21 U/L (ref 0–37)
Albumin: 3.9 g/dL (ref 3.5–5.2)
Alkaline Phosphatase: 76 U/L (ref 39–117)
Chloride: 89 mEq/L — ABNORMAL LOW (ref 96–112)
Creatinine, Ser: 0.5 mg/dL (ref 0.4–1.2)
Potassium: 3.7 mEq/L (ref 3.5–5.1)
Sodium: 127 mEq/L — ABNORMAL LOW (ref 135–145)
Total Bilirubin: 0.7 mg/dL (ref 0.3–1.2)

## 2010-08-06 LAB — CBC
Platelets: 302 10*3/uL (ref 150–400)
RDW: 14.1 % (ref 11.5–15.5)
WBC: 8.7 10*3/uL (ref 4.0–10.5)

## 2010-08-06 LAB — CK TOTAL AND CKMB (NOT AT ARMC): CK, MB: 4.8 ng/mL — ABNORMAL HIGH (ref 0.3–4.0)

## 2010-08-06 LAB — TROPONIN I: Troponin I: <0.3 ng/mL (ref <0.30)

## 2010-08-07 ENCOUNTER — Inpatient Hospital Stay (HOSPITAL_COMMUNITY)
Admission: EM | Admit: 2010-08-07 | Discharge: 2010-08-08 | DRG: 287 | Disposition: A | Discharge status: Home / Self Care | Payer: Medicare Other | Source: Other Acute Inpatient Hospital | Attending: Cardiology | Admitting: Cardiology

## 2010-08-07 DIAGNOSIS — I2510 Atherosclerotic heart disease of native coronary artery without angina pectoris: Secondary | ICD-10-CM

## 2010-08-07 DIAGNOSIS — E785 Hyperlipidemia, unspecified: Secondary | ICD-10-CM | POA: Diagnosis present

## 2010-08-07 DIAGNOSIS — K8020 Calculus of gallbladder without cholecystitis without obstruction: Secondary | ICD-10-CM | POA: Diagnosis present

## 2010-08-07 DIAGNOSIS — K219 Gastro-esophageal reflux disease without esophagitis: Secondary | ICD-10-CM | POA: Diagnosis present

## 2010-08-07 DIAGNOSIS — I200 Unstable angina: Principal | ICD-10-CM | POA: Diagnosis present

## 2010-08-07 DIAGNOSIS — Z880 Allergy status to penicillin: Secondary | ICD-10-CM

## 2010-08-07 DIAGNOSIS — Z888 Allergy status to other drugs, medicaments and biological substances status: Secondary | ICD-10-CM

## 2010-08-07 DIAGNOSIS — I10 Essential (primary) hypertension: Secondary | ICD-10-CM | POA: Diagnosis present

## 2010-08-07 DIAGNOSIS — R079 Chest pain, unspecified: Secondary | ICD-10-CM

## 2010-08-07 DIAGNOSIS — Z951 Presence of aortocoronary bypass graft: Secondary | ICD-10-CM

## 2010-08-07 DIAGNOSIS — E871 Hypo-osmolality and hyponatremia: Secondary | ICD-10-CM | POA: Diagnosis present

## 2010-08-07 LAB — CARDIAC PANEL(CRET KIN+CKTOT+MB+TROPI): Troponin I: <0.3 ng/mL (ref <0.30)

## 2010-08-07 LAB — TROPONIN I: Troponin I: <0.3 ng/mL (ref <0.30)

## 2010-08-07 LAB — LIPID PANEL
HDL: 54 mg/dL (ref >39)
LDL Cholesterol: 110 mg/dL — ABNORMAL HIGH (ref 0–99)
Triglycerides: 188 mg/dL — ABNORMAL HIGH (ref <150)

## 2010-08-07 LAB — CK TOTAL AND CKMB (NOT AT ARMC): Total CK: 145 U/L (ref 7–177)

## 2010-08-08 ENCOUNTER — Telehealth: Payer: Self-pay | Admitting: Cardiovascular Disease

## 2010-08-08 DIAGNOSIS — R079 Chest pain, unspecified: Secondary | ICD-10-CM

## 2010-08-08 LAB — BASIC METABOLIC PANEL
BUN: 10 mg/dL (ref 6–23)
Chloride: 99 mEq/L (ref 96–112)
Creatinine, Ser: 0.54 mg/dL (ref 0.4–1.2)
GFR calc Af Amer: >60 mL/min (ref >60)
GFR calc non Af Amer: >60 mL/min (ref >60)
Glucose, Bld: 88 mg/dL (ref 70–99)

## 2010-08-08 LAB — CBC
HCT: 33.2 % — ABNORMAL LOW (ref 36.0–46.0)
MCHC: 34.9 g/dL (ref 30.0–36.0)
MCV: 87.8 fL (ref 78.0–100.0)
RDW: 15 % (ref 11.5–15.5)

## 2010-08-08 NOTE — Cardiovascular Report (Signed)
[name], [name] NO.:  [id number]  MEDICAL RECORD NO.:  [id number]  LOCATION:  6526                         FACILITY:  [facility]  PHYSICIAN:  Verne Carrow, MDDATE OF BIRTH:  [birth year]  DATE OF PROCEDURE:  08/07/2010 DATE OF DISCHARGE:                           CARDIAC CATHETERIZATION   PRIMARY CARDIOLOGIST:  Theron Arista C. Eden Emms, MD, [facility]  PRIMARY CARE PHYSICIAN:  [name]. [name], MD  PROCEDURE PERFORMED: 1. Left heart catheterization. 2. Selective coronary angiography. 3. Left ventricular angiogram. 4. Saphenous vein graft angiography. 5. Left internal mammary artery graft angiography.  OPERATOR:  Verne Carrow, MD  INDICATIONS:  This is a 75 year old Caucasian female with a history of coronary artery disease who underwent three-vessel bypass surgery in November 2011 who is admitted with chest pain.  Cardiac enzymes are negative.  DETAILS OF PROCEDURE:  The patient was brought to the main cardiac catheterization laboratory after signing informed consent for the procedure.  The right groin was prepped and draped in usual sterile fashion.  1% lidocaine was used for local anesthesia.  A 5-French sheath was inserted into the right femoral artery without difficulty.  Standard diagnostic catheters were used to perform selective coronary angiography.  A JR-4 catheter was used to selectively engage and inject the native right coronary artery as well as both saphenous vein graft. We had considerable difficulty engaging the left internal mammary artery as it had an abnormal takeoff just at the level of several other smaller branch vessels.  The catheters all became selectively engaged in the smaller branch vessels.  We performed a nonselective injection of the left subclavian artery with good visualization of the left internal mammary graft that was found to be patent to the mid LAD.  A pigtail catheter was used to perform a left ventricular  angiogram.  The patient tolerated the procedure well.  She was taken to the recovery area in stable condition.  HEMODYNAMIC FINDINGS:  Central aortic pressure 141/63.  Left ventricular pressure 156/19.  ANGIOGRAPHIC FINDINGS: 1. The left main coronary artery has no obstructive disease.  This     vessel bifurcates into the circumflex and the left anterior     descending artery. 2. The left anterior descending artery has 100% proximal occlusion.     The mid and distal vessel fills from the left internal mammary     artery graft.  There is a diagonal branch that fills from the     saphenous vein graft. 3. The circumflex artery is a large-caliber vessel that is dominant.     First obtuse marginal is moderate-sized and has a 90% ostial     stenosis.  This obtuse marginal branch fills both from native flow     and from the saphenous vein graft. 4. The right coronary artery is a small nondominant vessel with no     disease. 5. Saphenous vein graft to first obtuse marginal branch is patent. 6. The saphenous vein graft to diagonal branch is patent. 7. Left internal mammary artery graft to the mid LAD is patent. 8. Left ventricular angiogram was performed in the RAO projection and     showed normal left ventricular systolic function with ejection  fraction of 65% to 70%.  IMPRESSION: 1. Double-vessel coronary artery disease status post three-vessel     coronary artery bypass grafting with 3/3 patent bypass grafts. 2. Normal left ventricular systolic function.  RECOMMENDATIONS:  I would continue medical management at this time.  No further cardiac workup.     Verne Carrow, MD     CM/MEDQ  D:  08/07/2010  T:  08/08/2010  Job:  [id number]  cc:   [name]. Eden Emms, MD, [facility]. [name], MD  Electronically Signed by Verne Carrow MD on 08/08/2010 04:12:13 PM

## 2010-08-08 NOTE — Telephone Encounter (Signed)
Spoke with pt, she is going to wait and ask her questions when she is seen on the 13th of July [name]

## 2010-08-08 NOTE — Telephone Encounter (Signed)
Pt has questions regarding her medication

## 2010-08-10 ENCOUNTER — Ambulatory Visit: Payer: Medicare Other | Admitting: Cardiovascular Disease

## 2010-08-23 ENCOUNTER — Other Ambulatory Visit: Payer: Self-pay | Admitting: Gastroenterology

## 2010-08-26 NOTE — H&P (Signed)
NAMETANEA, MOGA NO.:  [id number]  MEDICAL RECORD NO.:  [id number]  LOCATION:  2926                         FACILITY:  [facility]  PHYSICIAN:  Wendi Snipes, MD DATE OF BIRTH:  11/10/1935  DATE OF ADMISSION:  08/07/2010 DATE OF DISCHARGE:                             HISTORY & PHYSICAL   CARDIOLOGIST:  Theron Arista C. Eden Emms, MD, [facility]  PRIMARY CARE PHYSICIAN:  [name]. [name], MD  CHIEF COMPLAINT:  Chest pain.  HISTORY OF PRESENT ILLNESS:  This is a 75 year old white female with a history of coronary artery disease status post coronary artery bypass graft in 2011 who presents here with chest pain.  She describes her symptoms as chest pressure which has been on and off again for approximately 2 days.  She also has associated progressive weakness over the past 2 weeks and additionally reports dyspnea on exertion to the point where she is out of breath while walking a short distance.  She reported to the emergency department because the chest pressure became severe this evening.  She initially scheduled a followup appointment with Dr. Eden Emms, though her symptoms became severe enough requiring that she would like further evaluation and management of her progressive angina.  She otherwise denies palpitations, increased lower extremity edema, paroxysmal nocturnal dyspnea, or orthopnea.  PAST MEDICAL HISTORY: 1. Coronary artery disease status post coronary artery bypass graft on     January 19, 2010 with LIMA to the LAD, saphenous vein graft to the     D1 and saphenous vein graft to OM-1. 2. Hypertension. 3. Hyperlipidemia. 4. Gallstones. 5. GERD.  ALLERGIES: 1. PENICILLIN. 2. BIAXIN. 3. DOXYCYCLINE. 4. CECLOR. 5. TOBRADEX.  MEDICATIONS ON ADMISSION: 1. Travatan ophthalmic. 2. Nasonex 50 twice daily. 3. Aspirin 81 mg daily. 4. Metoprolol 50 mg twice daily. 5. Losartan/hydrochlorothiazide 100/12.5 daily. 6. Crestor 10 mg daily. 7. Fish oil 1200 two  pills twice daily. 8. Omeprazole 40 mg daily. 9. Hyoscyamine as needed. 10.Vitamin D 50,000 units weekly. 11.Tramadol 50 mg as needed.  SOCIAL HISTORY:  She lives in [place] with her husband.  She is a part-time Engineer, site.  She does not smoke.  FAMILY HISTORY:  Negative for premature coronary artery disease.  REVIEW OF SYSTEMS:  All 14 systems were reviewed and were negative except as mentioned in detail in the HPI.  PHYSICAL EXAMINATION:  VITAL SIGNS:  Blood pressure is 154/70, respiratory rate is 16, pulse is 63, and she is saturating 98% on 2 L nasal cannula. GENERAL:  She is a 75 year old white female appearing stated age in no acute distress. HEENT:  Moist mucous membranes.  Pupils equal, round, and react to light and accommodation.  Anicteric sclerae. NECK:  No jugular venous distention.  No thyromegaly. CARDIOVASCULAR:  Regular rate and rhythm.  No murmurs, rubs, or gallops. LUNGS:  Occasional scattered wheeze, otherwise clear to auscultation bilaterally. ABDOMEN:  Nontender and nondistended.  Positive bowel sounds.  No masses. EXTREMITIES:  No clubbing, cyanosis, or edema. NEUROLOGIC:  Alert and oriented x3.  Cranial nerves II-XII grossly intact.  No focal neurologic deficits. PSYCH:  Mood and affect are appropriate. SKIN:  Warm, dry, and intact.  No rashes.  RADIOLOGIC DATA:  Chest  x-ray showed no acute cardiopulmonary process. EKG showed normal sinus rhythm at a rate of 65 beats per minute with nonspecific inferolateral ST abnormalities.  LABORATORY DATA:  White cell count is 8.7 and hematocrit is 33.4. Potassium is 3.7.  Troponin is less than 0.3 x2.  ASSESSMENT AND PLAN:  This is a 75 year old with a history of coronary artery disease status post coronary artery bypass graft with symptoms of chest pain that have been progressive over the past few days and dyspnea on exertion. 1. Unstable angina.  There is currently no objective evidence of      ischemia.  We will continue her current anticoagulation with     enoxaparin and continue nitroglycerin drip.  We will cycle cardiac     enzymes and will pursue noninvasive risk ratification in the     morning if there is no further evidence of ischemia. 2. Hypertension.  We will continue her ARB and monitor very closely. 3. Hyperlipidemia.  We will continue her statin therapy and check a     fasting lipid profile. 4. Hyponatremia.  We will consider fluid restriction, although this is     likely a tea and toast phenomenon or potentially syndrome of     inappropriate antidiuretic hormone secretion.  We will recheck labs     in the morning.     Wendi Snipes, MD     BHH/MEDQ  D:  08/07/2010  T:  08/07/2010  Job:  [id number]  Electronically Signed by Jim Desanctis MD on 08/26/2010 10:06:35 AM

## 2010-08-27 ENCOUNTER — Telehealth: Payer: Self-pay | Admitting: Pulmonary Disease

## 2010-08-27 MED ORDER — SULFAMETHOXAZOLE-TRIMETHOPRIM 800-160 MG PO TABS: 1.0000 | ORAL_TABLET | Freq: Two times a day (BID) | ORAL | Status: AC

## 2010-08-27 NOTE — Telephone Encounter (Signed)
I spoke with the patient and she states on and off x 2 weeks she has been breaking out on her face with a rash. She states x 2 days the rash has stayed and been worse then before.  She states the rash is red and has red raised bumps on both cheeks and across her nose. She states it does not itch.  She also c/o her eyes being red, as well as having a slight headache.  She has taken one dose of benadryl this morning without any relief. Pt refused appt with TP. Please advise. Carron Curie, CMA Allergies  Allergen Reactions  . Clarithromycin     REACTION: rash  . Codeine     REACTION: n/v  . Lisinopril     REACTION: Developed ACE cough...  . Penicillins     REACTION: itchy rash  . Simvastatin     REACTION: pt states INTOL to STATINS \\T \ refuses to restart  . Trilipix     REACTION: pt states INTOL to Trilipix w/ "thigh burning"  . Ceclor (Cefaclor) Rash  . Doxycycline Rash  . Tobradex Rash

## 2010-08-27 NOTE — Telephone Encounter (Signed)
Spoke with pt and notified of recs per SN. Pt verbalized understanding and rx was sent to pharm. Advised her to call back if she is not improving. Rx was sent to pharm.

## 2010-08-27 NOTE — Telephone Encounter (Signed)
Per SN---could be rosacea--rx for septra ds   #20  1 po bid and stay out of the sun.  thanks

## 2010-08-30 ENCOUNTER — Ambulatory Visit: Payer: Medicare Other | Admitting: Adult Health

## 2010-08-31 NOTE — Telephone Encounter (Signed)
Error.[name] ° °

## 2010-09-04 NOTE — Discharge Summary (Signed)
NAMELENORA, GOMES            ACCOUNT NO.:  [id number]  MEDICAL RECORD NO.:  [id number]  LOCATION:  6526                         FACILITY:  [facility]  PHYSICIAN:  [name]. Eden Emms, MD, FACCDATE OF BIRTH:  1935/07/18  DATE OF ADMISSION:  08/07/2010 DATE OF DISCHARGE:  08/08/2010                              DISCHARGE SUMMARY   PRIMARY CARDIOLOGIST:  Theron Arista C. Eden Emms, MD, [facility]  PRIMARY CARE PROVIDER:  [name]. [name], MD  DISCHARGE DIAGNOSIS:  Chest pain without objective evidence of ischemia.  SECONDARY DIAGNOSES: 1. Coronary artery disease status post prior coronary artery bypass     grafting November 2011 with 3/3 patent grafts this admission. 2. Hypertension. 3. Hyperlipidemia. 4. Cholelithiasis 5. Gastroesophageal reflux disease.  ALLERGIES:  PENICILLIN, BIAXIN, DOXYCYCLINE, CECLOR, TOBRADEX.  PROCEDURES:  Left heart cardiac catheterization revealing severe three- vessel coronary artery disease with 3/3 patent grafts and normal left ventricular function with an ejection fraction of 65-70%.  HISTORY OF PRESENT ILLNESS:  A 75 year old female with prior history of coronary artery disease status post coronary artery bypass grafting x3 November 2011, who was in her usual state of health until approximately 2 days prior to admission when she began to experience intermittent chest discomfort which became progressively more frequent and associated with significant weakness.  She presented to the [facility] on the evening of August 06, 2010, where ECG was nonacute and initial cardiac markers were negative.  She was transferred to [facility] for further evaluation and Cardiology admission.  HOSPITAL COURSE:  The patient ruled out for an MI.  Given recent coronary artery bypass grafting and similarity of symptoms to prior angina, a decision was made to pursue cardiac catheterization. Catheterization took place on August 07, 2010, revealing severe two-vessel coronary artery  disease with a small nondominant right coronary artery. Vein graft to the OM1, vein graft to the diagonal, and LIMA to the LAD were all widely patent while EF was 65-70%.  Medical therapy was recommended.  Postprocedure, the patient has been ambulating without recurrent symptoms or limitations.  We plan to discharge her home today in good condition.  DISCHARGE LABORATORIES:  Hemoglobin 11.6, hematocrit 32.2, WBC 6.9, platelets 253.  Sodium 127, potassium 3.7, chloride 89, CO2 27, BUN 11, creatinine 0.50, glucose 102.  Total bilirubin 0.7, alkaline phosphatase 76, AST 21, ALT 21, total protein 7.1, albumin 2.9, calcium 10.0, hemoglobin A1c 6.1, CK 145, MB 4.3, troponin-I less than 0.30.  Total cholesterol 202, triglycerides 188, HDL 54, LDL 110.  MRSA screen was negative.  DISPOSITION:  The patient will be discharged home today in good condition.  FOLLOWUP PLANS AND APPOINTMENTS:  We will arrange for followup with Dr. Eden Emms in approximately 3/4 weeks.  She will follow with Dr. [name] as previously scheduled.DISCHARGE MEDICATIONS: 1. Nitroglycerin 0.4 mg sublingual p.r.n. chest pain. 2. Alavert 1 tablet daily. 3. Aspirin 81 mg daily. 4. Crestor 10 mg q. Monday and Thursday. 5. Fish oil 1 g 2 caps b.i.d. 6. Losartan/hydrochlorothiazide 50/12.5 mg daily. 7. Metoprolol tartrate 50 mg b.i.d. 8. Nasonex 50 mcg 1 spray each nostril b.i.d. 9. Omeprazole 40 mg daily. 10.Travatan 0.004% 1 drop both eyes daily. 11.Vitamin B12 one tablet daily. 12.Vitamin  D2 50,000 units q. Saturday.  OUTSTANDING LABORATORY STUDIES:  None.     Nicolasa Ducking, ANP   ______________________________ [name]. Eden Emms, MD, [facility]    CB/MEDQ  D:  08/08/2010  T:  08/08/2010  Job:  [id number]  Electronically Signed by Nicolasa Ducking ANP on 08/17/2010 02:40:44 PM Electronically Signed by Charlton Haws MD [facility] on 09/04/2010 12:51:24 PM

## 2010-09-07 ENCOUNTER — Ambulatory Visit (INDEPENDENT_AMBULATORY_CARE_PROVIDER_SITE_OTHER): Payer: Medicare Other | Admitting: Cardiovascular Disease

## 2010-09-07 ENCOUNTER — Encounter: Payer: Self-pay | Admitting: Cardiovascular Disease

## 2010-09-07 DIAGNOSIS — E785 Hyperlipidemia, unspecified: Secondary | ICD-10-CM

## 2010-09-07 DIAGNOSIS — R21 Rash and other nonspecific skin eruption: Secondary | ICD-10-CM

## 2010-09-07 DIAGNOSIS — I2510 Atherosclerotic heart disease of native coronary artery without angina pectoris: Secondary | ICD-10-CM

## 2010-09-07 DIAGNOSIS — I10 Essential (primary) hypertension: Secondary | ICD-10-CM

## 2010-09-07 NOTE — Patient Instructions (Signed)
Your physician recommends that you schedule a follow-up appointment in 3 MONTHS. 

## 2010-09-07 NOTE — Assessment & Plan Note (Signed)
Continue prednisone taper.  May bump BP a bit but should be ok. F/U [employer]

## 2010-09-07 NOTE — Assessment & Plan Note (Addendum)
Well controlled.  Continue current medications and low sodium Dash type diet.    

## 2010-09-07 NOTE — Assessment & Plan Note (Signed)
Stable with no angina and good activity level.  Continue medical Rx  

## 2010-09-07 NOTE — Progress Notes (Signed)
[name] is seen today post F/U for CAD with instent restenosis and recent CABG with Dr Tyrone Sage 01/19/10 . Post op course unremarkable. CRF;s include HTN and elevated lipids. Rx limited by side effects. Cough with ACE and myalgias with higher dose statins. She still has issues with gallstones. Prior to her CABG she had documented stones by [place] and has seen Dr Jarold Motto and Andrey Campanile. Recent headaches and bronchitis followed by Dr [name]. No chest pain Sternum well healed. Enjoys cardiac rehab. Recent clinic visit with good labs and tolerating lower dose statin  She had been having issues with her BP meds but separating them in time seems to have solved this.  Recent eye allergy evaluated by Dr Stevphen Rochester and on prednisone taper now.   ROS: Denies fever, malais, weight loss, blurry vision, decreased visual acuity, cough, sputum, SOB, hemoptysis, pleuritic pain, palpitaitons, heartburn, abdominal pain, melena, lower extremity edema, claudication, .  All other systems reviewed and negative  General: Affect appropriate Healthy:  appears stated age HEENT: normal Neck supple with no adenopathy JVP normal no bruits no thyromegaly Lungs clear with no wheezing and good diaphragmatic motion Heart:  S1/S2 no murmur,rub, gallop or click PMI normal Abdomen: benighn, BS positve, no tenderness, no AAA no bruit.  No HSM or HJR Distal pulses intact with no bruits No edema Neuro non-focal Skin warm and dry erythmetous rash on face No muscular weakness   Current Outpatient Prescriptions  Medication Sig Dispense Refill  . aspirin 81 MG tablet Take 81 mg by mouth daily.        [name] co-enzyme Q-10 30 MG capsule Take 30 mg by mouth daily.        . fexofenadine (ALLEGRA) 180 MG tablet Take 180 mg by mouth daily.        . Glucosamine-Chondroit-Vit C-Mn (GLUCOSAMINE CHONDR 1500 COMPLX PO) Take 1 tablet by mouth 3 (three) times daily.        . hyoscyamine (ANASPAZ) 0.125 MG TBDP Place 0.125 mg under the tongue as  needed.       [name] losartan-hydrochlorothiazide (HYZAAR) 100-12.5 MG per tablet take 1 tablet by mouth once daily  30 tablet  12  . methylPREDNISolone (MEDROL) 16 MG tablet Take 16 mg by mouth daily.        . metoprolol (LOPRESSOR) 50 MG tablet take 1 tablet by mouth twice a day  60 tablet  10  . mometasone (NASONEX) 50 MCG/ACT nasal spray 2 sprays by Nasal route daily.        . nitroGLYCERIN (NITROSTAT) 0.4 MG SL tablet Take one tablet  By mouth SL every 5 minutes for a total of 3 doses as needed for chest pain.  25 tablet  3  . Omega-3 Fatty Acids (FISH OIL) 1200 MG CAPS 2 tabs po bid       . omeprazole (PRILOSEC) 40 MG capsule take 1 capsule by mouth once daily 30 MINUTES BEFORE THE FIRST MEAL OF THE DAY  30 capsule  6  . rosuvastatin (CRESTOR) 10 MG tablet 1 tab po 2 days a week      . traMADol (ULTRAM) 50 MG tablet Take 50 mg by mouth every 6 (six) hours as needed.        . travoprost, benzalkonium, (TRAVATAN) 0.004 % ophthalmic solution 1 drop at bedtime.        . vitamin B-12 (CYANOCOBALAMIN) 100 MCG tablet 1 tab po qd       . Vitamin D, Ergocalciferol, (DRISDOL) 50000 UNITS CAPS Take  by mouth. Once a month        Allergies  Clarithromycin; Codeine; Lisinopril; Penicillins; Simvastatin; Trilipix; Ceclor; Doxycycline; and Tobradex  Electrocardiogram:  Assessment and Plan

## 2010-09-07 NOTE — Assessment & Plan Note (Signed)
Cholesterol is at goal.  Continue current dose of statin and diet Rx.  No myalgias or side effects.  F/U  LFT's in 6 months. Lab Results  Component Value Date   LDLCALC 110* 08/07/2010

## 2010-09-26 ENCOUNTER — Encounter: Payer: Self-pay | Admitting: Pulmonary Disease

## 2010-09-26 ENCOUNTER — Ambulatory Visit (INDEPENDENT_AMBULATORY_CARE_PROVIDER_SITE_OTHER): Payer: Medicare Other | Admitting: Pulmonary Disease

## 2010-09-26 DIAGNOSIS — R51 Headache: Secondary | ICD-10-CM

## 2010-09-26 DIAGNOSIS — E785 Hyperlipidemia, unspecified: Secondary | ICD-10-CM

## 2010-09-26 DIAGNOSIS — I2510 Atherosclerotic heart disease of native coronary artery without angina pectoris: Secondary | ICD-10-CM

## 2010-09-26 DIAGNOSIS — J309 Allergic rhinitis, unspecified: Secondary | ICD-10-CM

## 2010-09-26 DIAGNOSIS — K589 Irritable bowel syndrome without diarrhea: Secondary | ICD-10-CM

## 2010-09-26 DIAGNOSIS — IMO0001 Reserved for inherently not codable concepts without codable children: Secondary | ICD-10-CM

## 2010-09-26 DIAGNOSIS — R42 Dizziness and giddiness: Secondary | ICD-10-CM

## 2010-09-26 DIAGNOSIS — I10 Essential (primary) hypertension: Secondary | ICD-10-CM

## 2010-09-26 DIAGNOSIS — K5730 Diverticulosis of large intestine without perforation or abscess without bleeding: Secondary | ICD-10-CM

## 2010-09-26 DIAGNOSIS — J45909 Unspecified asthma, uncomplicated: Secondary | ICD-10-CM

## 2010-09-26 DIAGNOSIS — K8020 Calculus of gallbladder without cholecystitis without obstruction: Secondary | ICD-10-CM

## 2010-09-26 DIAGNOSIS — K219 Gastro-esophageal reflux disease without esophagitis: Secondary | ICD-10-CM

## 2010-09-26 NOTE — Progress Notes (Signed)
Subjective:    Patient ID: [name], female    DOB: 1935/09/28, 75 y.o.   MRN: [id number]  HPI 75 y/o WF here for a follow up visit... she has multiple medical problems including AR & Asthma;  HBP;  CAD followed by Walker Kehr & s/p 3 vessel CABG 11/11 by DrGearhardt;  Hyperlipidemia followed in the [facility];  GERD/ IBS/ Divertics;  Fibromyalgia;  Chr HAs & dizziness...  ~  May 04, 2009:  presents w/ HA- posterior pain> band-like c/w muscle contraction HA... she under some stress dealing w/ father's estate... she went to the ER w/ CP & elevated BP> she ruled out for MI & was started on HYZAAR 100-12.5 by DrNishan in f/u... she has a repeat Myoview pending next week (it was neg)... she continues her Lipid Management by Walker Kehr & the Lipid Clinic on [place], FlaxSeedOil, & diet...  she had f/u colonoscopy3/11 by DrPatterson> divertics, 1 polyp= tubular adenoma, f/u 54yrs...   ~  Jul 25, 2009:  she's noted some incr SOB, wheezing over the last 2 weeks w/ the heat... we discussed using the Symbicort, Proair, etc... she saw Walker Kehr 5/11- doing well, stopped her Plavix, no angina & non-ischemic Myoview 3/11... BP controlled on meds;  continues on FishOil & FlaxSeesOil for her Chol (intol to all other meds) followed by the clinic but FLP remains deranged w/ TChol 277 & LDL 193;  we will give her a TDAP today...  ~  December 19, 2009:  presents for add-on visit due to chest discomfort that started after picking greens recommended by the LipidClinic... she got scared & took NTG thinking it might be esoph spasm- no relief from this or Levsin called in by [employer]... she notes chest wall tender & pain incr w/ activity/ movement; and some relief after heat & Vicodin... exam shows tender chest wall, clear lungs, reg rhythm & we discussed rest, heat, analgesics... BP remains controlled on meds;  Chol improved on Cres10 twice per week via lipid clinic f/u;  GI appears stable overall;  hx FM/ HAs/ dizzy, etc... OK  Flu shot.  ~  May 08, 2010:  [name] has had a busy 10mo> epis of CP, cath w/ in-stent restenosis, then CABG x3 11/11 by DrGearhardt... now c/o refractory URI x 6wks w/ sinusitis/ bronchitis/ laryngitis & rx w/ ZPak, Biaxin, etc from DrESL "I'm allergic" she says... finally received Avelox/ Medrol & improved...     AR/ Asthma>  DrESL stopped her Symbicort in favor of QVar...    HBP>  controlled on Toprol & Hyzaar w/ BP 130/70 today... denies visual changes, CP, palipit, dizziness, syncope, dyspnea, edema, etc...     CAD>  s/p CABG as noted & saw both DrNishan & DrGearhardt 12/11 (notes reviewed)...    Hyperlipid>  followed in the [facility] & FLP 2/12 reviewed on Cres10 2d per week...    Others>  she has persistant FM symptoms and chr HAs, uses Vicodin Prn...  ~  Sep 26, 2010:  66mo ROV & she was re-hospitalized 6/12 by [facility] for CP> cath showed severe 3 vessel CAD w/ 3/3 grafts patent & norm LVF w/ EF= 65-70%; no change in meds; she states "they don't know what it was"; she saw [place] 7/12 in office f/u & he noted Rx limited by side effects, continued same regimen...  Her CC remains various aches & pains, notes breathing is good w/o cough, sputum, SOB, etc;  Notes GI w/u DrPatterson prev w/ Gallstones.Marland KitchenMarland Kitchen  Problem List:  GLAUCOMA (ICD-365.9) - she is sched for right cataract surg by DrBevis in Feb...  ALLERGIC RHINITIS (ICD-477.9) - she uses Alavert & Nasonex Prn; followed by DrESL...  ASTHMA (ICD-493.90) - prev on Symbicort80 and improved, but only using it Prn; DrLeBauer switched her to Dulera100, but this is not currently on her list. ~  5/11:  notes incr chest symptoms in the heat & rec to take the Symbicort 2sp Bid... ~  2012:  After her CABG & repeat hosp by [facility] she is noted to be off her inhalers...  HYPERTENSION (ICD-401.9) - controlled on TOPROL 50mg Bid & HYZAAR 100-12.5 daily (off Lisinopril due to ACE cough)... BP 128/84 today and similiar prev checks... tol meds well... denies HA,  fatigue, visual changes, CP, palipit, syncope, dyspnea, edema, etc...  CORONARY ARTERY DISEASE (ICD-414.00) - on ASA 81mg /d (intol to 325 she says) & off Plavix per Walker Kehr...  Cardiolite 9/08 showed CP & HBP response, EF+85%... had cath 10/08 w/ single vessel dis w/ mod ostial LAD stenosis and patent LAD stent, normal LVF.[name]. option for med Rx vs off pump LIMA... she notes some CP/ pressure "burning" when walking up a certain hill during her daily walks, but states this is stable and "no worse"... ~  Myoview 9/09 was neg- no scar or ischemia, EF= 89%, hypertensive response- Lisinopril10 added. ~  f/u Myoview 3/11 was neg- no scar or ischemia, EF= 86%, norm BP response, fair exerc capacity. ~  saw Walker Kehr 5/11- no CP, he stopped her Plavix, stable- continue other meds same. ~  She had recurrent CP 11/11 w/ in-stent restenosis on cath> subseq 3 vessel CABG by DrGearhart & doing well since then... ~  6/12:  Hosp w/ CP & repeat cath showed CAD & 3/3 grafts patent, EF= 65-70%  HYPERLIPIDEMIA (ICD-272.4) - on CRESTOR 10mg  2 days per week, FISH OIL 1200mg  Tid + Flax Seed Oil... she has been intol to statins in the past>  tried low dose Trilipix but stopped this due to "thigh burning">  now followed in the Lipid Clinic--- ~  FLP 7/08 showed TChol 217, TG 236, HDL 46, LDL 150...  ~  FLP 6/09 showed TChol 233, TG 263, HDL 46, LDL 150... she agrees to try LOW DOSE Trilipix. ~  FLP 11/09 showed TChol 162, TG 167, HDL 45, LDL 83... ~  FLP 11/10 showed TChol 240, TG 271, HDL 45, LDL 150... LipClin Rx w/ FishOil & FlaxSeedOil. ~  FLP 2/11 showed TChol 285, TG 156, HDL 79, LDL 180... LC is aware & working w/ her regularly. ~  FLP 5/11 showed TChol 277, TG 241, HDL55, LDL 193...  LC added Cres10 just 2d per week. ~  FLP 10/11 showed TChol 198, TG 215, HDL 53, LDL 109... much improved on Cres10 twice weekly. ~  SEE LIPID CLINIC follow up Lipid Profiles...  GERD (ICD-530.81) - on OMEPRAZOLE 40mg /d... last EGD by  DrPatterson 8/07 revealed sm HH, & gastritis and dilatation done... (RUT=neg, PPI Rx)... LEVSIN 0.125mg  helps her esoph spasm... Prn Phenergan for nausea.  DIVERTICULOSIS, COLON (ICD-562.10) & IRRITABLE BOWEL SYNDROME (ICD-564.1) ~  colonoscopy 2/03 by DrPatterson showed divertics and 5 mm polyp... ~  colonoscopy 3/11 showed divertics & cecal polyp= tubular adenoma, w/ f/u planned 9yrs.  GALLSTONES>>  Diagnosed by DrPatterson and referred to [facility] DrWilson, pt asked to call prn fopr incr symptoms to consider surg...  FIBROMYALGIA (ICD-729.1) - she c/o chr fatigue, aching/ sore, on Tramadol & Skelaxin, but most benefit from low  dose Hydrocodone ~1/2 tab Prn... I have recommended an increase exercise program to her... ~  5/11:  Vit D level = 50 on 50000 u weekly by Rock Nephew, NP  HEADACHE (ICD-784.0) - eval at [facility] in 2002, but she notes Rx didn't help... ~  3/11:  presented w/ muscle contraction HA's> Rx Tramadol 50mg  Q6H Prn.  DIZZINESS, CHRONIC (ICD-780.4) - MRI 2/09 per Walker Kehr showed atrophy, sm vessel dis, NAD...  Health Maintenance: ~  GI:  DrPatterson & up to date on colon screening. ~  GYN:  yearly f/u w/ Rock Nephew, NP for PAP, Mammogram at [facility] ?last BMD. ~  Immunizations:  yearly Flu vaccines in fall, Pneumovax in 2007?, TDAP given 5/11...   Past Surgical History  Procedure Date  . Coronary angioplasty with stent placement     Stent 2007  . Open heart surgery     01/19/2010    Outpatient Encounter Prescriptions as of 09/26/2010  Medication Sig Dispense Refill  . aspirin 81 MG tablet Take 81 mg by mouth daily.        [name] co-enzyme Q-10 30 MG capsule Take 30 mg by mouth daily.        . fexofenadine (ALLEGRA) 180 MG tablet Take 180 mg by mouth daily.        . Glucosamine-Chondroit-Vit C-Mn (GLUCOSAMINE CHONDR 1500 COMPLX PO) Take 1 tablet by mouth 3 (three) times daily.        . hyoscyamine (ANASPAZ) 0.125 MG TBDP Place 0.125 mg under the tongue as needed.        [name] losartan-hydrochlorothiazide (HYZAAR) 100-12.5 MG per tablet take 1 tablet by mouth once daily  30 tablet  12  . methylPREDNISolone (MEDROL) 16 MG tablet Take one tablet by mouth every other day      . metoprolol (LOPRESSOR) 50 MG tablet take 1 tablet by mouth twice a day  60 tablet  10  . mometasone (NASONEX) 50 MCG/ACT nasal spray 2 sprays by Nasal route daily.        . nitroGLYCERIN (NITROSTAT) 0.4 MG SL tablet Take one tablet  By mouth SL every 5 minutes for a total of 3 doses as needed for chest pain.  25 tablet  3  . Omega-3 Fatty Acids (FISH OIL) 1200 MG CAPS 2 tabs po bid       . omeprazole (PRILOSEC) 40 MG capsule take 1 capsule by mouth once daily 30 MINUTES BEFORE THE FIRST MEAL OF THE DAY  30 capsule  6  . rosuvastatin (CRESTOR) 10 MG tablet 1 tab po 2 days a week      . traMADol (ULTRAM) 50 MG tablet Take 50 mg by mouth every 6 (six) hours as needed.        . travoprost, benzalkonium, (TRAVATAN) 0.004 % ophthalmic solution 1 drop at bedtime.        . vitamin B-12 (CYANOCOBALAMIN) 100 MCG tablet 1 tab po qd       . Vitamin D, Ergocalciferol, (DRISDOL) 50000 UNITS CAPS Take by mouth. Once a month        Allergies  Allergen Reactions  . Clarithromycin     REACTION: rash  . Codeine     REACTION: n/v  . Lisinopril     REACTION: Developed ACE cough...  . Penicillins     REACTION: itchy rash  . Simvastatin     REACTION: pt states INTOL to STATINS \\T \ refuses to restart  . Trilipix     REACTION: pt states INTOL  to Trilipix w/ "thigh burning"  . Ceclor (Cefaclor) Rash  . Doxycycline Rash  . Tobradex Rash    Current Medications, Allergies, Past Medical History, Past Surgical History, Family History, and Social History were reviewed in [employer] record.    Review of Systems    See HPI - all other systems neg except as noted...  The patient complains of chest pain and headaches.  The patient denies anorexia, fever, weight loss, weight gain,  vision loss, decreased hearing, hoarseness, syncope, dyspnea on exertion, peripheral edema, prolonged cough, hemoptysis, abdominal pain, melena, hematochezia, severe indigestion/heartburn, hematuria, incontinence, muscle weakness, suspicious skin lesions, transient blindness, difficulty walking, depression, unusual weight change, abnormal bleeding, enlarged lymph nodes, and angioedema.   Objective:   Physical Exam    WD, WN, 75 y/o WF in NAD... GENERAL:  Alert & oriented; pleasant & cooperative... HEENT:  [place]/AT, EOM-wnl, PERRLA, EACs-clear, TMs-wnl, NOSE-clear, THROAT-clear & wnl. NECK:  Supple w/ fair ROM; no JVD; normal carotid impulses w/o bruits; no thyromegaly or nodules palpated; no lymphadenopathy. CHEST:  Clear to P & A; without wheezes/ rales/ or rhonchi heard... chest wall is tender & reproduces her pain... HEART:  Regular Rhythm; without murmurs/ rubs/ or gallops detected... ABDOMEN:  Soft & nontender; normal bowel sounds; no organomegaly or masses palpated... EXT: without deformities, mild arthritic changes and +trigger points, no varicose veins/ venous insuffic/ or edema. NEURO:  CN's intact; motor testing normal; sensory testing normal; gait normal & balance OK. DERM:  No lesions noted; no rash etc...   Assessment & Plan:   Asthma/ AR>  Stable & she is followed by DrESL, currently on Medrol for rash & weaning per DrESL; she is apparently off her prev Prn inhalers like Symbicort/ Dulera, etc...  HBP>  Controlled on Toprol & Hyzaar, continue same...  CAD>  Recent cath w/ patent grafts; had f/u DrNishan- continue current meds, no changes made...  CHOL>  Followed in the Lipid Clinic (on Cres10 taking 2d pewr week) & using all their tricks to aide compliance & minimize side effects...  GI> GERD, Divertics, Gallstones>  Followed by [employer] & stones eval by DrWilson [facility]; continue meds...  FM/ HAs/ etc>  On Ultram, Tylenol, etc...  Other medical issues as noted> on CoQ10,  Glucosamine, Vit B12, Vit D, etc..[name]

## 2010-09-26 NOTE — Patient Instructions (Signed)
Today we updated your med list in EPIC...  We reviewed your recent [facility] records, XRay & LABS...  Continue your follow up in the Lipid Clinic...  Call for any questions...  Let's plan a follow up visit in 6 months, sooner if needed for problems.Marland KitchenMarland Kitchen

## 2010-10-01 ENCOUNTER — Encounter: Payer: Self-pay | Admitting: Pulmonary Disease

## 2010-10-02 ENCOUNTER — Other Ambulatory Visit: Payer: Self-pay | Admitting: Cardiovascular Disease

## 2010-10-02 ENCOUNTER — Other Ambulatory Visit (INDEPENDENT_AMBULATORY_CARE_PROVIDER_SITE_OTHER): Payer: Medicare Other

## 2010-10-02 DIAGNOSIS — Z79899 Other long term (current) drug therapy: Secondary | ICD-10-CM

## 2010-10-02 DIAGNOSIS — E7800 Pure hypercholesterolemia, unspecified: Secondary | ICD-10-CM

## 2010-10-02 LAB — HEPATIC FUNCTION PANEL
Alkaline Phosphatase: 51 U/L (ref 39–117)
Bilirubin, Direct: 0.1 mg/dL (ref 0.0–0.3)
Total Bilirubin: 1.1 mg/dL (ref 0.3–1.2)
Total Protein: 6.6 g/dL (ref 6.0–8.3)

## 2010-10-02 LAB — LIPID PANEL: VLDL: 44.2 mg/dL — ABNORMAL HIGH (ref 0.0–40.0)

## 2010-10-04 ENCOUNTER — Ambulatory Visit (INDEPENDENT_AMBULATORY_CARE_PROVIDER_SITE_OTHER): Payer: Medicare Other

## 2010-10-04 VITALS — BP 114/59 | Wt 131.0 lb

## 2010-10-04 DIAGNOSIS — E785 Hyperlipidemia, unspecified: Secondary | ICD-10-CM

## 2010-10-04 MED ORDER — ROSUVASTATIN CALCIUM 10 MG PO TABS: 10.0000 mg | ORAL_TABLET | ORAL | Status: DC

## 2010-10-04 NOTE — Progress Notes (Signed)
[name] returns today for 3 month dyslipidemia follow-up.  Current lipid regimen includes Fish Oil 1,200 mg, 2 capsules twice daily, and Crestor 10 mg twice per week (Tuesday and Friday).  Patient reports compliance with both medications.  She tolerates both well, except for minor muscle aches occuring mainly in her thighs and shoulders/back.  This is much improved on Crestor twice weekly vs daily.  Pt also supplements with CoQ10 100 mg daily, but is unsure of whether or not this truly makes a difference.  In the past, patient has taken zetia, niacin, and other statin including simvastatin, all of which she is intolerant of.    Dietary review reveals that patient makes positive attempts at a healthy diet, but continues to have room for improvement.  She has stopped eating fried fish and now chooses broiled.  She grills and bakes, but rarely fries and tries to eat mostly chicken and [place] and sometimes salmon.  Beverages include water, splenda-sweetened tea, and 1% milk.  Patient reports eating plenty of fresh fruit when in season and attempts to eat fresh vegetables as much as possible.  Breakfast options are relatively healthy and patient often has raisin bran, cheerios, and boiled egg whites.  Once per week she does treat herself to sausage, bacon, or pork tenderloin, but has whole wheat bread with this (rather than biscuits).  [name] weakness is sweets and she admits to cookies and icecream rather often (3-4 times per week).    Review of exercise reveals that patient is walking 30 minutes 4 times per week.  She was participating in cardiac rehab s/p PCI, but she has finished this program.    Patient does not smoke or drink alcohol.

## 2010-10-04 NOTE — Assessment & Plan Note (Signed)
Current lipid values are as follows:  TC 229 (previously 202, goal <200), TG 221 (previously 188, goal <150), HDL 64.8 (previously 54, goal > 50), LDL 123.6 (previously 110, goal <70).  Patient is at goal for HDL, but is above goal for both TG and LDL.  LDL goal is <70 due to extensive history of CABG, stent with restenosis, CAD, HTN.  Patient has previously been intolerant to statins and is currently tolerating Crestor twice weekly.  I do not believe this is enough to get LDL down to goal and have encouraged patient to add an additional 1/2 tablet to her weekly crestor dose (10 mg on Mon and Fri, and 5 mg on Wednesday).  She has agreed to try this.  TG are likely above goal due to dietary noncompliance, specifically sweets (cookies and icecream).  Pt will limit sweets to 1-2 times per week only (vs 4-5 times per week currently).  I have also encouraged her to increase fiber intake through high fiber foods, Fiber One products, or fiber supplements.  I have encouraged patient to work hard on lifestyle changes and attempt increase in crestor.  Will follow-up in 8 weeks.   Plan: Limit sweets to 1-2 times per week and increase fiber intake Continue exercising regularly Increase Crestor by 1/2 tablet (5 mg) weekly.  10 mg Mon and Fri and 5 mg Wednesday. (crestor samples given #14) Return for follow-up in 8 weeks.

## 2010-10-04 NOTE — Patient Instructions (Signed)
1)  Continue exercising:  30 minutes at least 4 times per week 2)  Continue making healthy dietary choices, attempt to limit sweets to no more than 1-2 times per week and increase fiber (try Fiber One products, or Kellog Fiber cereals, or fiber supplements). 3)  Increase Crestor dose:  Take 10 mg on Monday and Friday, and 5 mg on Wednesday.  4)  Continue  Fish Oil at current dose.  5)  Follow-up in 8 weeks.   Lipid Clinic:  Thursday, October 11th @ 1:30 pm Labwork:  Tuesday, October 9th @ 7:30 am (FASTING)

## 2010-10-05 ENCOUNTER — Encounter: Payer: Self-pay | Admitting: Adult Health

## 2010-10-05 ENCOUNTER — Ambulatory Visit (INDEPENDENT_AMBULATORY_CARE_PROVIDER_SITE_OTHER): Payer: Medicare Other | Admitting: Adult Health

## 2010-10-05 VITALS — BP 124/72 | HR 67 | Temp 97.6°F | Ht 59.0 in | Wt 133.0 lb

## 2010-10-05 DIAGNOSIS — R21 Rash and other nonspecific skin eruption: Secondary | ICD-10-CM

## 2010-10-05 NOTE — Patient Instructions (Addendum)
Avoid extreme heat. Avoid hot beverages .  We are referring you to dermatology to evaluate your facial rash  Use sensitive soaps and lotions.  Please contact office for sooner follow up if symptoms do not improve or worsen or seek emergency care

## 2010-10-05 NOTE — Assessment & Plan Note (Signed)
Recurrent facial rash ? Etiology  ? Rosecea   Plan ;  Refer to dermatology  Avoid hot fluids /heat/ stress Sensitive soaps/lotions.

## 2010-10-05 NOTE — Progress Notes (Signed)
Subjective:    Patient ID: [name], female    DOB: 12/14/1935, 75 y.o.   MRN: [id number]  HPI  75 y/o WF here for a follow up visit... she has multiple medical problems including AR & Asthma;  HBP;  CAD followed by Walker Kehr & s/p 3 vessel CABG 11/11 by DrGearhardt;  Hyperlipidemia followed in the [facility];  GERD/ IBS/ Divertics;  Fibromyalgia;  Chr HAs & dizziness...  ~  May 04, 2009:  presents w/ HA- posterior pain> band-like c/w muscle contraction HA... she under some stress dealing w/ father's estate... she went to the ER w/ CP & elevated BP> she ruled out for MI & was started on HYZAAR 100-12.5 by DrNishan in f/u... she has a repeat Myoview pending next week (it was neg)... she continues her Lipid Management by Walker Kehr & the [facility] on [place], FlaxSeedOil, & diet...  she had f/u colonoscopy3/11 by DrPatterson> divertics, 1 polyp= tubular adenoma, f/u 87yrs...   ~  Jul 25, 2009:  she's noted some incr SOB, wheezing over the last 2 weeks w/ the heat... we discussed using the Symbicort, Proair, etc... she saw Walker Kehr 5/11- doing well, stopped her Plavix, no angina & non-ischemic Myoview 3/11... BP controlled on meds;  continues on FishOil & FlaxSeesOil for her Chol (intol to all other meds) followed by the clinic but FLP remains deranged w/ TChol 277 & LDL 193;  we will give her a TDAP today...  ~  December 19, 2009:  presents for add-on visit due to chest discomfort that started after picking greens recommended by the Lipid[facility]inic... she got scared & took NTG thinking it might be esoph spasm- no relief from this or Levsin called in by [employer]... she notes chest wall tender & pain incr w/ activity/ movement; and some relief after heat & Vicodin... exam shows tender chest wall, clear lungs, reg rhythm & we discussed rest, heat, analgesics... BP remains controlled on meds;  Chol improved on Cres10 twice per week via lipid clinic f/u;  GI appears stable overall;  hx FM/ HAs/ dizzy, etc...  OK Flu shot.  ~  May 08, 2010:  [name] has had a busy 39mo> epis of CP, cath w/ in-stent restenosis, then CABG x3 11/11 by DrGearhardt... now c/o refractory URI x 6wks w/ sinusitis/ bronchitis/ laryngitis & rx w/ ZPak, Biaxin, etc from DrESL "I'm allergic" she says... finally received Avelox/ Medrol & improved...     AR/ Asthma>  DrESL stopped her Symbicort in favor of QVar...    HBP>  controlled on Toprol & Hyzaar w/ BP 130/70 today... denies visual changes, CP, palipit, dizziness, syncope, dyspnea, edema, etc...     CAD>  s/p CABG as noted & saw both DrNishan & DrGearhardt 12/11 (notes reviewed)...    Hyperlipid>  followed in the [facility] & FLP 2/12 reviewed on Cres10 2d per week...    Others>  she has persistant FM symptoms and chr HAs, uses Vicodin Prn...  ~  Sep 26, 2010:  14mo ROV & she was re-hospitalized 6/12 by [facility] for CP> cath showed severe 3 vessel CAD w/ 3/3 grafts patent & norm LVF w/ EF= 65-70%; no change in meds; she states "they don't know what it was"; she saw [place] 7/12 in office f/u & he noted Rx limited by side effects, continued same regimen...  Her CC remains various aches & pains, notes breathing is good w/o cough, sputum, SOB, etc;  Notes GI w/u DrPatterson prev w/ Gallstones...  ~10/05/2010 Acute ov  Pt presents for recurrent facial rash for 6 months . Complains of red, itching rash on face with swelling x2days - reports has had this before.  has treated with benadryl and allegra.  has finished pred taper given by Dr [name] for same symptoms this week. Started 6 months ago with rash on cheeks that comes and goes. She had irritation on eyes and was treated with cream then face broke out. Now has on/off rash on face with red eyes. Seen by Dr. [name] in allergy clinic tx w/ steroid taper. With no improvement. Face feels puffy at times. She denies new meds, skin products, pets, travel or injury. No mouth/lips/tongue swelling. No dypsnea , chest pain or dysphagia.           Problem  List:  GLAUCOMA (ICD-365.9) - she is sched for right cataract surg by DrBevis in Feb...  ALLERGIC RHINITIS (ICD-477.9) - she uses Alavert & Nasonex Prn; followed by DrESL...  ASTHMA (ICD-493.90) - prev on Symbicort80 and improved, but only using it Prn; DrLeBauer switched her to Dulera100, but this is not currently on her list. ~  5/11:  notes incr chest symptoms in the heat & rec to take the Symbicort 2sp Bid... ~  2012:  After her CABG & repeat hosp by [facility] she is noted to be off her inhalers...  HYPERTENSION (ICD-401.9) - controlled on TOPROL 50mg Bid & HYZAAR 100-12.5 daily (off Lisinopril due to ACE cough)... BP 128/84 today and similiar prev checks... tol meds well... denies HA, fatigue, visual changes, CP, palipit, syncope, dyspnea, edema, etc...  CORONARY ARTERY DISEASE (ICD-414.00) - on ASA 81mg /d (intol to 325 she says) & off Plavix per Walker Kehr...  Cardiolite 9/08 showed CP & HBP response, EF+85%... had cath 10/08 w/ single vessel dis w/ mod ostial LAD stenosis and patent LAD stent, normal LVF.[name]. option for med Rx vs off pump LIMA... she notes some CP/ pressure "burning" when walking up a certain hill during her daily walks, but states this is stable and "no worse"... ~  Myoview 9/09 was neg- no scar or ischemia, EF= 89%, hypertensive response- Lisinopril10 added. ~  f/u Myoview 3/11 was neg- no scar or ischemia, EF= 86%, norm BP response, fair exerc capacity. ~  saw Walker Kehr 5/11- no CP, he stopped her Plavix, stable- continue other meds same. ~  She had recurrent CP 11/11 w/ in-stent restenosis on cath> subseq 3 vessel CABG by DrGearhart & doing well since then... ~  6/12:  Hosp w/ CP & repeat cath showed CAD & 3/3 grafts patent, EF= 65-70%  HYPERLIPIDEMIA (ICD-272.4) - on CRESTOR 10mg  2 days per week, FISH OIL 1200mg  Tid + Flax Seed Oil... she has been intol to statins in the past>  tried low dose Trilipix but stopped this due to "thigh burning">  now followed in the Lipid  Clinic--- ~  FLP 7/08 showed TChol 217, TG 236, HDL 46, LDL 150...  ~  FLP 6/09 showed TChol 233, TG 263, HDL 46, LDL 150... she agrees to try LOW DOSE Trilipix. ~  FLP 11/09 showed TChol 162, TG 167, HDL 45, LDL 83... ~  FLP 11/10 showed TChol 240, TG 271, HDL 45, LDL 150... LipClin Rx w/ FishOil & FlaxSeedOil. ~  FLP 2/11 showed TChol 285, TG 156, HDL 79, LDL 180... LC is aware & working w/ her regularly. ~  FLP 5/11 showed TChol 277, TG 241, HDL55, LDL 193...  LC added Cres10 just 2d per week. ~  FLP 10/11 showed TChol 198,  TG 215, HDL 53, LDL 109... much improved on Cres10 twice weekly. ~  SEE LIPID CLINIC follow up Lipid Profiles...  GERD (ICD-530.81) - on OMEPRAZOLE 40mg /d... last EGD by DrPatterson 8/07 revealed sm HH, & gastritis and dilatation done... (RUT=neg, PPI Rx)... LEVSIN 0.125mg  helps her esoph spasm... Prn Phenergan for nausea.  DIVERTICULOSIS, COLON (ICD-562.10) & IRRITABLE BOWEL SYNDROME (ICD-564.1) ~  colonoscopy 2/03 by DrPatterson showed divertics and 5 mm polyp... ~  colonoscopy 3/11 showed divertics & cecal polyp= tubular adenoma, w/ f/u planned 60yrs.  GALLSTONES>>  Diagnosed by DrPatterson and referred to [facility] DrWilson, pt asked to call prn fopr incr symptoms to consider surg...  FIBROMYALGIA (ICD-729.1) - she c/o chr fatigue, aching/ sore, on Tramadol & Skelaxin, but most benefit from low dose Hydrocodone ~1/2 tab Prn... I have recommended an increase exercise program to her... ~  5/11:  Vit D level = 50 on 50000 u weekly by Rock Nephew, NP  HEADACHE (ICD-784.0) - eval at [facility] clinic in 2002, but she notes Rx didn't help... ~  3/11:  presented w/ muscle contraction HA's> Rx Tramadol 50mg  Q6H Prn.  DIZZINESS, CHRONIC (ICD-780.4) - MRI 2/09 per Walker Kehr showed atrophy, sm vessel dis, NAD...  Health Maintenance: ~  GI:  DrPatterson & up to date on colon screening. ~  GYN:  yearly f/u w/ Rock Nephew, NP for PAP, Mammogram at [facility], ?last BMD. ~   Immunizations:  yearly Flu vaccines in fall, Pneumovax in 2007?, TDAP given 5/11...   Past Surgical History  Procedure Date  . Coronary angioplasty with stent placement     Stent 2007  . Open heart surgery     01/19/2010  . Coronary artery bypass graft     Outpatient Encounter Prescriptions as of 10/05/2010  Medication Sig Dispense Refill  . aspirin 81 MG tablet Take 81 mg by mouth daily.        . Coenzyme Q10 (COQ10) 100 MG CAPS Take 1 capsule by mouth daily.        . fexofenadine (ALLEGRA) 180 MG tablet Take 180 mg by mouth daily.        . Glucosamine-Chondroit-Vit C-Mn (GLUCOSAMINE CHONDR 1500 COMPLX PO) Take 1 tablet by mouth 3 (three) times daily.        . hyoscyamine (ANASPAZ) 0.125 MG TBDP Place 0.125 mg under the tongue as needed.       [name] losartan-hydrochlorothiazide (HYZAAR) 100-12.5 MG per tablet take 1 tablet by mouth once daily  30 tablet  12  . metoprolol (LOPRESSOR) 50 MG tablet take 1 tablet by mouth twice a day  60 tablet  10  . mometasone (NASONEX) 50 MCG/ACT nasal spray 2 sprays by Nasal route daily.        . nitroGLYCERIN (NITROSTAT) 0.4 MG SL tablet Take one tablet  By mouth SL every 5 minutes for a total of 3 doses as needed for chest pain.  25 tablet  3  . Omega-3 Fatty Acids (FISH OIL) 1200 MG CAPS 2 tabs po bid       . omeprazole (PRILOSEC) 40 MG capsule take 1 capsule by mouth once daily 30 MINUTES BEFORE THE FIRST MEAL OF THE DAY  30 capsule  6  . rosuvastatin (CRESTOR) 10 MG tablet Take 1 tablet (10 mg total) by mouth 3 (three) times a week.  12 tablet  3  . traMADol (ULTRAM) 50 MG tablet Take 50 mg by mouth every 6 (six) hours as needed.        [name]  travoprost, benzalkonium, (TRAVATAN) 0.004 % ophthalmic solution 1 drop at bedtime.        . vitamin B-12 (CYANOCOBALAMIN) 1000 MCG tablet Take 1,000 mcg by mouth daily.        . Vitamin D, Ergocalciferol, (DRISDOL) 50000 UNITS CAPS Take 50,000 Units by mouth every 30 (thirty) days. Once a month        Allergies   Allergen Reactions  . Clarithromycin     REACTION: rash  . Codeine     REACTION: n/v  . Lisinopril     REACTION: Developed ACE cough...  . Penicillins     REACTION: itchy rash  . Simvastatin     REACTION: pt states INTOL to STATINS \\T \ refuses to restart  . Trilipix     REACTION: pt states INTOL to Trilipix w/ "thigh burning"  . Ceclor (Cefaclor) Rash  . Doxycycline Rash  . Tobradex Rash    Current Medications, Allergies, Past Medical History, Past Surgical History, Family History, and Social History were reviewed in [employer] record.    Review of Systems     See HPI - all other systems neg except as noted...  The patient complains of chest pain and headaches.  The patient denies anorexia, fever, weight loss, weight gain, vision loss, decreased hearing, hoarseness, syncope, dyspnea on exertion, peripheral edema, prolonged cough, hemoptysis, abdominal pain, melena, hematochezia, severe indigestion/heartburn, hematuria, incontinence, muscle weakness,  transient blindness, difficulty walking, depression, unusual weight change, abnormal bleeding, enlarged lymph nodes, and angioedema.   Objective:   Physical Exam     WD, WN, 75 y/o WF in NAD... GENERAL:  Alert & oriented; pleasant & cooperative... HEENT:  [place]/AT, EOM-wnl, PERRLA, EACs-clear, TMs-wnl, NOSE-clear, THROAT-clear & wnl. NECK:  Supple w/ fair ROM; no JVD; normal carotid impulses w/o bruits; no thyromegaly or nodules palpated; no lymphadenopathy. CHEST:  Clear to P & A; without wheezes/ rales/ or rhonchi heard... chest wall is tender & reproduces her pain... HEART:  Regular Rhythm; without murmurs/ rubs/ or gallops detected... ABDOMEN:  Soft & nontender; normal bowel sounds; no organomegaly or masses palpated... EXT: without deformities, mild arthritic changes and +trigger points, no varicose veins/ venous insuffic/ or edema. NEURO:  CN's intact; motor testing normal; sensory testing normal;  gait normal & balance OK. DERM:  Patchy papules along cheeks bilaterally right >left , few telangiectasis    Assessment & Plan:

## 2010-11-16 LAB — CREATININE, SERUM
Creatinine, Ser: 0.67
GFR calc Af Amer: >60
GFR calc non Af Amer: >60

## 2010-11-28 ENCOUNTER — Ambulatory Visit (INDEPENDENT_AMBULATORY_CARE_PROVIDER_SITE_OTHER): Payer: Medicare Other | Admitting: Cardiovascular Disease

## 2010-11-28 ENCOUNTER — Encounter: Payer: Self-pay | Admitting: Cardiovascular Disease

## 2010-11-28 DIAGNOSIS — I2510 Atherosclerotic heart disease of native coronary artery without angina pectoris: Secondary | ICD-10-CM

## 2010-11-28 DIAGNOSIS — E785 Hyperlipidemia, unspecified: Secondary | ICD-10-CM

## 2010-11-28 DIAGNOSIS — I739 Peripheral vascular disease, unspecified: Secondary | ICD-10-CM

## 2010-11-28 DIAGNOSIS — M79609 Pain in unspecified limb: Secondary | ICD-10-CM

## 2010-11-28 DIAGNOSIS — I10 Essential (primary) hypertension: Secondary | ICD-10-CM

## 2010-11-28 DIAGNOSIS — M79606 Pain in leg, unspecified: Secondary | ICD-10-CM

## 2010-11-28 MED ORDER — TRAMADOL HCL 50 MG PO TABS: 50.0000 mg | ORAL_TABLET | Freq: Four times a day (QID) | ORAL | Status: DC | PRN

## 2010-11-28 MED ORDER — ROSUVASTATIN CALCIUM 10 MG PO TABS: 10.0000 mg | ORAL_TABLET | Freq: Every day | ORAL | Status: DC

## 2010-11-28 NOTE — Assessment & Plan Note (Signed)
Good pulses on exam.  Doubt PVD.  Has back problems.  Check ABI's

## 2010-11-28 NOTE — Assessment & Plan Note (Addendum)
Stable S/P CABG 11/11.  Continue ASA and beta blocker.

## 2010-11-28 NOTE — Progress Notes (Signed)
Addended by: Scherrie Bateman E on: 11/28/2010 02:32 PM   Modules accepted: Orders

## 2010-11-28 NOTE — Assessment & Plan Note (Signed)
Well controlled.  Continue current medications and low sodium Dash type diet.    

## 2010-11-28 NOTE — Assessment & Plan Note (Addendum)
Cholesterol is ok Only able to take crestor 10mg  M,W,F F/U clinic Lab Results  Component Value Date   LDLCALC 110* 08/07/2010

## 2010-11-28 NOTE — Patient Instructions (Addendum)
Your physician wants you to follow-up in:6 MONTHS WITH DR Haywood Filler will receive a reminder letter in the mail two months in advance. If you don't receive a letter, please call our office to schedule the follow-up appointment.  Your physician recommends that you continue on your current medications as directed. Please refer to the Current Medication list given to you today.  Your physician has requested that you have an ankle brachial index (ABI). During this test an ultrasound and blood pressure cuff are used to evaluate the arteries that supply the arms and legs with blood. Allow thirty minutes for this exam. There are no restrictions or special instructions. DX CLAUDICATION

## 2010-11-28 NOTE — Progress Notes (Signed)
[name] is seen today post F/U for CAD with instent restenosis and recent CABG with Dr Tyrone Sage 01/19/10 . Post op course unremarkable. CRF;s include HTN and elevated lipids. Rx limited by side effects. Cough with ACE and myalgias with higher dose statins. She still has issues with gallstones. Prior to her CABG she had documented stones by [place] and has seen Dr Jarold Motto and Andrey Campanile. Recent headaches and bronchitis followed by Dr [name]. No chest pain Sternum well healed. Enjoys cardiac rehab. Recent clinic visit with good labs and tolerating lower dose statin She had been having issues with her BP meds but separating them in time seems to have solved this. Had  eye allergy evaluated by Dr Stevphen Rochester and on prednisone taper in July.  She has pain in the buttocks and thighs bilaterally with ambulation especially going up hill   ROS: Denies fever, malais, weight loss, blurry vision, decreased visual acuity, cough, sputum, SOB, hemoptysis, pleuritic pain, palpitaitons, heartburn, abdominal pain, melena, lower extremity edema, , or rash.  All other systems reviewed and negative  General: Affect appropriate Healthy:  appears stated age HEENT: normal Neck supple with no adenopathy JVP normal no bruits no thyromegaly Lungs clear with no wheezing and good diaphragmatic motion Heart:  S1/S2 no murmur,rub, gallop or click PMI normal Abdomen: benighn, BS positve, no tenderness, no AAA no bruit.  No HSM or HJR Distal pulses intact with no bruits No edema Neuro non-focal Skin warm and dry No muscular weakness   Current Outpatient Prescriptions  Medication Sig Dispense Refill  . aspirin 81 MG tablet Take 81 mg by mouth daily.        . Coenzyme Q10 (COQ10) 100 MG CAPS Take 1 capsule by mouth daily.        . fexofenadine (ALLEGRA) 180 MG tablet Take 180 mg by mouth daily.        . Glucosamine-Chondroit-Vit C-Mn (GLUCOSAMINE CHONDR 1500 COMPLX PO) Take 1 tablet by mouth 3 (three) times daily.        [name]  losartan-hydrochlorothiazide (HYZAAR) 100-12.5 MG per tablet take 1 tablet by mouth once daily  30 tablet  12  . metoprolol (LOPRESSOR) 50 MG tablet take 1 tablet by mouth twice a day  60 tablet  10  . mometasone (NASONEX) 50 MCG/ACT nasal spray 2 sprays by Nasal route daily.        . nitroGLYCERIN (NITROSTAT) 0.4 MG SL tablet Take one tablet  By mouth SL every 5 minutes for a total of 3 doses as needed for chest pain.  25 tablet  3  . Omega-3 Fatty Acids (FISH OIL) 1200 MG CAPS 2 tabs po bid       . omeprazole (PRILOSEC) 40 MG capsule take 1 capsule by mouth once daily 30 MINUTES BEFORE THE FIRST MEAL OF THE DAY  30 capsule  6  . rosuvastatin (CRESTOR) 10 MG tablet 1 tab po daily( except on Tuesday and Thursday no tabs )  and on Wednesday 1/2 tab daily       . traMADol (ULTRAM) 50 MG tablet Take 50 mg by mouth every 6 (six) hours as needed.        . travoprost, benzalkonium, (TRAVATAN) 0.004 % ophthalmic solution 1 drop at bedtime.        . vitamin B-12 (CYANOCOBALAMIN) 1000 MCG tablet Take 1,000 mcg by mouth daily.        . Vitamin D, Ergocalciferol, (DRISDOL) 50000 UNITS CAPS Take 50,000 Units by mouth every 30 (thirty) days. Once  a month        Allergies  Clarithromycin; Codeine; Lisinopril; Penicillins; Simvastatin; Trilipix; Ceclor; Doxycycline; and Tobradex  Electrocardiogram:  Assessment and Plan

## 2010-12-03 ENCOUNTER — Other Ambulatory Visit: Payer: Medicare Other

## 2010-12-04 ENCOUNTER — Ambulatory Visit: Payer: Medicare Other

## 2010-12-04 DIAGNOSIS — E7800 Pure hypercholesterolemia, unspecified: Secondary | ICD-10-CM

## 2010-12-04 DIAGNOSIS — Z79899 Other long term (current) drug therapy: Secondary | ICD-10-CM

## 2010-12-04 LAB — LIPID PANEL
Cholesterol: 167 mg/dL (ref 0–200)
HDL: 53.9 mg/dL (ref >39.00)
Triglycerides: 203 mg/dL — ABNORMAL HIGH (ref 0.0–149.0)

## 2010-12-04 LAB — HEPATIC FUNCTION PANEL
ALT: 25 U/L (ref 0–35)
AST: 23 U/L (ref 0–37)
Albumin: 4.1 g/dL (ref 3.5–5.2)
Total Bilirubin: 1.1 mg/dL (ref 0.3–1.2)
Total Protein: 7.3 g/dL (ref 6.0–8.3)

## 2010-12-04 LAB — LDL CHOLESTEROL, DIRECT: Direct LDL: 88.1 mg/dL

## 2010-12-06 ENCOUNTER — Ambulatory Visit (INDEPENDENT_AMBULATORY_CARE_PROVIDER_SITE_OTHER): Payer: Medicare Other | Admitting: Pharmacist

## 2010-12-06 ENCOUNTER — Telehealth: Payer: Self-pay | Admitting: Pulmonary Disease

## 2010-12-06 VITALS — BP 132/72 | HR 84 | Wt 133.5 lb

## 2010-12-06 DIAGNOSIS — N390 Urinary tract infection, site not specified: Secondary | ICD-10-CM

## 2010-12-06 DIAGNOSIS — E785 Hyperlipidemia, unspecified: Secondary | ICD-10-CM

## 2010-12-06 NOTE — Progress Notes (Signed)
[name] is a 75 year old white female presenting to the lipid clinic today for her 8 week follow up appointment for the management of her dyslipidemia. Current cholesterol medications include Crestor 10 mg on Mondays and Fridays and 5 mg on Wednesdays, along with fish oil 2400 mg twice daily. [name] reports that she has missed a couple of her Wednesday doses of her Crestor, as it was a recent addition to her regimen. She does acknowledge that her upper thigh pain has worsened since adding the extra dose of Crestor. She states that the pain is "off and on" and is worse at night and when she is sitting down. She is able to climb stairs.  [name] has had past intolerances to other cholesterol medications- Zetia, niacin, and simvastatin.  Diet: [name] states that she has tried to improve her diet. She has eliminated fried foods from her diet, and instead is cooking meals at home with her grill pan and in the oven or slow cooker. Breakfast consists of cereal (Cheerios or Raisin Bran) and the whites of a boiled egg, along with half a cup of coffee and an occasional piece of raisin bread toast with a small amount of peanut butter and jelly. Lunch is usually a sandwich ([place], lettuce, and tomato) with a few chips and iced tea sweetened with Splenda. Dinner is baked chicken or stewed beef with potatoes, peas, greens, or slaw, with water to drink. [name] states that she tries to stay hydrated with lots of water, and also enjoys hot tea. Sweets continue to be a weakness, eating at least 2 sweets per day.  Exercise: Patient continues to walk 4-5 days per week for 30 minutes. The route she walks does have some areas of incline.  Social History: Patient denies alcohol or tobacco (past or present) use.  Current Outpatient Prescriptions on File Prior to Visit  Medication Sig Dispense Refill  . aspirin 81 MG tablet Take 81 mg by mouth daily.        . Coenzyme Q10 (COQ10) 100 MG CAPS Take 1  capsule by mouth daily.        . fexofenadine (ALLEGRA) 180 MG tablet Take 180 mg by mouth daily.        . Glucosamine-Chondroit-Vit C-Mn (GLUCOSAMINE CHONDR 1500 COMPLX PO) Take 1 tablet by mouth 3 (three) times daily.        [name] losartan-hydrochlorothiazide (HYZAAR) 100-12.5 MG per tablet take 1 tablet by mouth once daily  30 tablet  12  . metoprolol (LOPRESSOR) 50 MG tablet take 1 tablet by mouth twice a day  60 tablet  10  . mometasone (NASONEX) 50 MCG/ACT nasal spray 2 sprays by Nasal route daily.        . nitroGLYCERIN (NITROSTAT) 0.4 MG SL tablet Take one tablet  By mouth SL every 5 minutes for a total of 3 doses as needed for chest pain.  25 tablet  3  . Omega-3 Fatty Acids (FISH OIL) 1200 MG CAPS 2 tabs po bid       . omeprazole (PRILOSEC) 40 MG capsule take 1 capsule by mouth once daily 30 MINUTES BEFORE THE FIRST MEAL OF THE DAY  30 capsule  6  . rosuvastatin (CRESTOR) 10 MG tablet Take 1 tablet (10 mg total) by mouth daily. 1 tab po daily( except on Tuesday and Thursday no tabs )  and on Wednesday 1/2 tab daily  30 tablet  6  . traMADol (ULTRAM) 50 MG tablet Take  1 tablet (50 mg total) by mouth every 6 (six) hours as needed.  30 tablet  1  . travoprost, benzalkonium, (TRAVATAN) 0.004 % ophthalmic solution 1 drop at bedtime.        . vitamin B-12 (CYANOCOBALAMIN) 1000 MCG tablet Take 1,000 mcg by mouth daily.        . Vitamin D, Ergocalciferol, (DRISDOL) 50000 UNITS CAPS Take 50,000 Units by mouth every 30 (thirty) days. Once a month

## 2010-12-06 NOTE — Patient Instructions (Addendum)
1) Continue taking Fish Oil 1200 mg 2 capsules twice daily and Crestor 10 mg on Mondays and Fridays and 5 mg on Wednesdays.  *If your leg pain worsens, you may take Crestor 5 mg Mondays, Wednesdays, and Fridays. 2) Continue your current Vitamin D supplement. 3) Continue CoQ-10 if desired. 4) Replace 1 of your daily sweets with either a fiber bar or a piece of fresh fruit. 5) Continue to limit portion sizes of foods such as potatoes and red meats. 6) Increase duration of walk on Tuesdays and Thursdays as tolerated.  Cholesterol Results from 12/04/10: Total Cholesterol: 167 (goal <200) Triglycerides: 203 (goal <150) HDL: 54 (goal >45) LDL: 88 (goal <70)  Follow up with Lipid Clinic in 6 months: Thursday, April 18 FASTING Lab Work at [facility]: Monday, April 15 at 7:30am

## 2010-12-06 NOTE — Assessment & Plan Note (Addendum)
Assessment: Lab values from 12/04/10: TC 167 (goal <200), TG 203 (goal <150), HDL 54 (goal >45), LDL 88 (goal <70). LFTs WNL. Addition of Crestor 5 mg one day per week has improved overall panel. Patient is near LDL goal, however is unable to tolerate an additional increase in dose of medications.   Plan: Will not adjust current medication regimen. Rather, will focus on additional small lifestyle modifications to hopefully reach remaining goals. Patient provided with instructions below and will follow up with lipid clinic in 6 months to reevaluate.  1) Continue taking Fish Oil 1200 mg 2 capsules twice daily and Crestor 10 mg on Mondays and Fridays and 5 mg on Wednesdays.  *If your leg pain worsens, you may take Crestor 5 mg Mondays, Wednesdays, and Fridays. 2) Continue your current Vitamin D supplement. 3) Continue CoQ-10 if desired. 4) Replace 1 of your daily sweets with either a fiber bar or a piece of fresh fruit. 5) Continue to limit portion sizes of foods such as potatoes and red meats. 6) Increase duration of walk on Tuesdays and Thursdays as tolerated.

## 2010-12-07 ENCOUNTER — Ambulatory Visit (INDEPENDENT_AMBULATORY_CARE_PROVIDER_SITE_OTHER): Payer: Medicare Other | Admitting: Adult Health

## 2010-12-07 ENCOUNTER — Encounter: Payer: Self-pay | Admitting: Adult Health

## 2010-12-07 ENCOUNTER — Other Ambulatory Visit (INDEPENDENT_AMBULATORY_CARE_PROVIDER_SITE_OTHER): Payer: Medicare Other

## 2010-12-07 VITALS — BP 128/72 | HR 60 | Temp 97.2°F | Ht 59.0 in | Wt 134.8 lb

## 2010-12-07 DIAGNOSIS — N3000 Acute cystitis without hematuria: Secondary | ICD-10-CM

## 2010-12-07 DIAGNOSIS — N390 Urinary tract infection, site not specified: Secondary | ICD-10-CM

## 2010-12-07 DIAGNOSIS — Z23 Encounter for immunization: Secondary | ICD-10-CM

## 2010-12-07 LAB — URINALYSIS, ROUTINE W REFLEX MICROSCOPIC
Bilirubin Urine: NEGATIVE
Ketones, ur: NEGATIVE
Urine Glucose: NEGATIVE
pH: 5.5 (ref 5.0–8.0)

## 2010-12-07 MED ORDER — CIPROFLOXACIN HCL 250 MG PO TABS: 250.0000 mg | ORAL_TABLET | Freq: Two times a day (BID) | ORAL | Status: AC

## 2010-12-07 NOTE — Assessment & Plan Note (Signed)
UA shows Acute UTI   Plan;  Cipro 250mg  Twice daily  For 7 days  Urinate frequently, empty bladder completely Increase water intake.  Please contact office for sooner follow up if symptoms do not improve or worsen or seek emergency care

## 2010-12-07 NOTE — Telephone Encounter (Signed)
Pt checking on status & can be reached at  [phone].  [name]

## 2010-12-07 NOTE — Progress Notes (Signed)
Subjective:    Patient ID: [name], female    DOB: Aug 26, 1935, 75 y.o.   MRN: [id number]  HPI  75 y/o WF here for a follow up visit... she has multiple medical problems including AR & Asthma;  HBP;  CAD followed by Walker Kehr & s/p 3 vessel CABG 11/11 by DrGearhardt;  Hyperlipidemia followed in the [facility];  GERD/ IBS/ Divertics;  Fibromyalgia;  Chr HAs & dizziness...  ~  May 04, 2009:  presents w/ HA- posterior pain> band-like c/w muscle contraction HA... she under some stress dealing w/ father's estate... she went to the ER w/ CP & elevated BP> she ruled out for MI & was started on HYZAAR 100-12.5 by DrNishan in f/u... she has a repeat Myoview pending next week (it was neg)... she continues her Lipid Management by Walker Kehr & the [facility] on [place], FlaxSeedOil, & diet...  she had f/u colonoscopy3/11 by DrPatterson> divertics, 1 polyp= tubular adenoma, f/u 31yrs...   ~  Jul 25, 2009:  she's noted some incr SOB, wheezing over the last 2 weeks w/ the heat... we discussed using the Symbicort, Proair, etc... she saw Walker Kehr 5/11- doing well, stopped her Plavix, no angina & non-ischemic Myoview 3/11... BP controlled on meds;  continues on FishOil & FlaxSeesOil for her Chol (intol to all other meds) followed by the clinic but FLP remains deranged w/ TChol 277 & LDL 193;  we will give her a TDAP today...  ~  December 19, 2009:  presents for add-on visit due to chest discomfort that started after picking greens recommended by the [facility]... she got scared & took NTG thinking it might be esoph spasm- no relief from this or Levsin called in by [employer]... she notes chest wall tender & pain incr w/ activity/ movement; and some relief after heat & Vicodin... exam shows tender chest wall, clear lungs, reg rhythm & we discussed rest, heat, analgesics... BP remains controlled on meds;  Chol improved on Cres10 twice per week via lipid clinic f/u;  GI appears stable overall;  hx FM/ HAs/ dizzy, etc...  OK Flu shot.  ~  May 08, 2010:  [name] has had a busy 50mo> epis of CP, cath w/ in-stent restenosis, then CABG x3 11/11 by DrGearhardt... now c/o refractory URI x 6wks w/ sinusitis/ bronchitis/ laryngitis & rx w/ ZPak, Biaxin, etc from DrESL "I'm allergic" she says... finally received Avelox/ Medrol & improved...     AR/ Asthma>  DrESL stopped her Symbicort in favor of QVar...    HBP>  controlled on Toprol & Hyzaar w/ BP 130/70 today... denies visual changes, CP, palipit, dizziness, syncope, dyspnea, edema, etc...     CAD>  s/p CABG as noted & saw both DrNishan & DrGearhardt 12/11 (notes reviewed)...    Hyperlipid>  followed in the [facility] & FLP 2/12 reviewed on Cres10 2d per week...    Others>  she has persistant FM symptoms and chr HAs, uses Vicodin Prn...  ~  Sep 26, 2010:  82mo ROV & she was re-hospitalized 6/12 by [facility] for CP> cath showed severe 3 vessel CAD w/ 3/3 grafts patent & norm LVF w/ EF= 65-70%; no change in meds; she states "they don't know what it was"; she saw [place] 7/12 in office f/u & he noted Rx limited by side effects, continued same regimen...  Her CC remains various aches & pains, notes breathing is good w/o cough, sputum, SOB, etc;  Notes GI w/u DrPatterson prev w/ Gallstones...  ~10/05/10  Acute ov  Pt presents for recurrent facial rash for 6 months . Complains of red, itching rash on face with swelling x2days - reports has had this before.  has treated with benadryl and allegra.  has finished pred taper given by Dr [name] for same symptoms this week. Started 6 months ago with rash on cheeks that comes and goes. She had irritation on eyes and was treated with cream then face broke out. Now has on/off rash on face with red eyes. Seen by Dr. [name] in allergy clinic tx w/ steroid taper. With no improvement. Face feels puffy at times. She denies new meds, skin products, pets, travel or injury. No mouth/lips/tongue swelling. No dypsnea , chest pain or dysphagia.  >>  12/07/2010 Acute  OV  Pt complains of pressure and burning when urinating along with lower back pain. x1 day.   No vomitting or diarrhea. No abdominal pain or fever. Took AZO with some help.  Last UTI was years ago . No hematuria           Problem List:  GLAUCOMA (ICD-365.9) - she is sched for right cataract surg by DrBevis in Feb...  ALLERGIC RHINITIS (ICD-477.9) - she uses Alavert & Nasonex Prn; followed by DrESL...  ASTHMA (ICD-493.90) - prev on Symbicort80 and improved, but only using it Prn; DrLeBauer switched her to Dulera100, but this is not currently on her list. ~  5/11:  notes incr chest symptoms in the heat & rec to take the Symbicort 2sp Bid... ~  2012:  After her CABG & repeat hosp by [facility] she is noted to be off her inhalers...  HYPERTENSION (ICD-401.9) - controlled on TOPROL 50mg Bid & HYZAAR 100-12.5 daily (off Lisinopril due to ACE cough)... BP 128/84 today and similiar prev checks... tol meds well... denies HA, fatigue, visual changes, CP, palipit, syncope, dyspnea, edema, etc...  CORONARY ARTERY DISEASE (ICD-414.00) - on ASA 81mg /d (intol to 325 she says) & off Plavix per Walker Kehr...  Cardiolite 9/08 showed CP & HBP response, EF+85%... had cath 10/08 w/ single vessel dis w/ mod ostial LAD stenosis and patent LAD stent, normal LVF.[name]. option for med Rx vs off pump LIMA... she notes some CP/ pressure "burning" when walking up a certain hill during her daily walks, but states this is stable and "no worse"... ~  Myoview 9/09 was neg- no scar or ischemia, EF= 89%, hypertensive response- Lisinopril10 added. ~  f/u Myoview 3/11 was neg- no scar or ischemia, EF= 86%, norm BP response, fair exerc capacity. ~  saw Walker Kehr 5/11- no CP, he stopped her Plavix, stable- continue other meds same. ~  She had recurrent CP 11/11 w/ in-stent restenosis on cath> subseq 3 vessel CABG by DrGearhart & doing well since then... ~  6/12:  Hosp w/ CP & repeat cath showed CAD & 3/3 grafts patent, EF=  65-70%  HYPERLIPIDEMIA (ICD-272.4) - on CRESTOR 10mg  2 days per week, FISH OIL 1200mg  Tid + Flax Seed Oil... she has been intol to statins in the past>  tried low dose Trilipix but stopped this due to "thigh burning">  now followed in the [facility]--- ~  FLP 7/08 showed TChol 217, TG 236, HDL 46, LDL 150...  ~  FLP 6/09 showed TChol 233, TG 263, HDL 46, LDL 150... she agrees to try LOW DOSE Trilipix. ~  FLP 11/09 showed TChol 162, TG 167, HDL 45, LDL 83... ~  FLP 11/10 showed TChol 240, TG 271, HDL 45, LDL 150... LipClin Rx w/ FishOil & FlaxSeedOil. ~  FLP 2/11 showed TChol 285, TG 156, HDL 79, LDL 180... LC is aware & working w/ her regularly. ~  FLP 5/11 showed TChol 277, TG 241, HDL55, LDL 193...  LC added Cres10 just 2d per week. ~  FLP 10/11 showed TChol 198, TG 215, HDL 53, LDL 109... much improved on Cres10 twice weekly. ~  SEE [facility]IC follow up Lipid Profiles...  GERD (ICD-530.81) - on OMEPRAZOLE 40mg /d... last EGD by DrPatterson 8/07 revealed sm HH, & gastritis and dilatation done... (RUT=neg, PPI Rx)... LEVSIN 0.125mg  helps her esoph spasm... Prn Phenergan for nausea.  DIVERTICULOSIS, COLON (ICD-562.10) & IRRITABLE BOWEL SYNDROME (ICD-564.1) ~  colonoscopy 2/03 by DrPatterson showed divertics and 5 mm polyp... ~  colonoscopy 3/11 showed divertics & cecal polyp= tubular adenoma, w/ f/u planned 22yrs.  GALLSTONES>>  Diagnosed by DrPatterson and referred to [facility] DrWilson, pt asked to call prn fopr incr symptoms to consider surg...  FIBROMYALGIA (ICD-729.1) - she c/o chr fatigue, aching/ sore, on Tramadol & Skelaxin, but most benefit from low dose Hydrocodone ~1/2 tab Prn... I have recommended an increase exercise program to her... ~  5/11:  Vit D level = 50 on 50000 u weekly by Rock Nephew, NP  HEADACHE (ICD-784.0) - eval at [facility] in 2002, but she notes Rx didn't help... ~  3/11:  presented w/ muscle contraction HA's> Rx Tramadol 50mg  Q6H Prn.  DIZZINESS, CHRONIC  (ICD-780.4) - MRI 2/09 per Walker Kehr showed atrophy, sm vessel dis, NAD...  Health Maintenance: ~  GI:  DrPatterson & up to date on colon screening. ~  GYN:  yearly f/u w/ Rock Nephew, NP for PAP, Mammogram [facility], ?last BMD. ~  Immunizations:  yearly Flu vaccines in fall, Pneumovax in 2007?, TDAP given 5/11...   Past Surgical History  Procedure Date  . Coronary angioplasty with stent placement     Stent 2007  . Open heart surgery     01/19/2010  . Coronary artery bypass graft     Outpatient Encounter Prescriptions as of 12/07/2010  Medication Sig Dispense Refill  . aspirin 81 MG tablet Take 81 mg by mouth daily.        . Coenzyme Q10 (COQ10) 100 MG CAPS Take 1 capsule by mouth daily.        . fexofenadine (ALLEGRA) 180 MG tablet Take 180 mg by mouth daily.        . Glucosamine-Chondroit-Vit C-Mn (GLUCOSAMINE CHONDR 1500 COMPLX PO) Take 1 tablet by mouth 3 (three) times daily.        [name] losartan-hydrochlorothiazide (HYZAAR) 100-12.5 MG per tablet take 1 tablet by mouth once daily  30 tablet  12  . metoprolol (LOPRESSOR) 50 MG tablet take 1 tablet by mouth twice a day  60 tablet  10  . mometasone (NASONEX) 50 MCG/ACT nasal spray 2 sprays by Nasal route daily.        . nitroGLYCERIN (NITROSTAT) 0.4 MG SL tablet Take one tablet  By mouth SL every 5 minutes for a total of 3 doses as needed for chest pain.  25 tablet  3  . Omega-3 Fatty Acids (FISH OIL) 1200 MG CAPS 2 tabs po bid       . omeprazole (PRILOSEC) 40 MG capsule take 1 capsule by mouth once daily 30 MINUTES BEFORE THE FIRST MEAL OF THE DAY  30 capsule  6  . rosuvastatin (CRESTOR) 10 MG tablet Take 1 tablet (10 mg total) by mouth daily. 1 tab po daily( except on Tuesday and Thursday  no tabs )  and on Wednesday 1/2 tab daily  30 tablet  6  . traMADol (ULTRAM) 50 MG tablet Take 1 tablet (50 mg total) by mouth every 6 (six) hours as needed.  30 tablet  1  . travoprost, benzalkonium, (TRAVATAN) 0.004 % ophthalmic solution 1 drop  at bedtime.        . vitamin B-12 (CYANOCOBALAMIN) 1000 MCG tablet Take 1,000 mcg by mouth daily.        . Vitamin D, Ergocalciferol, (DRISDOL) 50000 UNITS CAPS Take 50,000 Units by mouth every 30 (thirty) days. Once a month        Allergies  Allergen Reactions  . Clarithromycin     REACTION: rash  . Codeine     REACTION: n/v  . Lisinopril     REACTION: Developed ACE cough...  . Penicillins     REACTION: itchy rash  . Simvastatin     REACTION: pt states INTOL to STATINS \\T \ refuses to restart  . Trilipix     REACTION: pt states INTOL to Trilipix w/ "thigh burning"  . Ceclor (Cefaclor) Rash  . Doxycycline Rash  . Tobradex Rash    Current Medications, Allergies, Past Medical History, Past Surgical History, Family History, and Social History were reviewed in [employer] record.    Review of Systems     See HPI - all other systems neg except as noted...  The patient complains of chest pain and headaches.  The patient denies anorexia, fever, weight loss, weight gain, vision loss, decreased hearing, hoarseness, syncope, dyspnea on exertion, peripheral edema, prolonged cough, hemoptysis, abdominal pain, melena, hematochezia, severe indigestion/heartburn, hematuria, incontinence, muscle weakness,  transient blindness, difficulty walking, depression, unusual weight change, abnormal bleeding, enlarged lymph nodes, and angioedema.   Objective:   Physical Exam     WD, WN, 75 y/o WF in NAD... GENERAL:  Alert & oriented; pleasant & cooperative... HEENT:  [place]/AT, EOM-wnl, PERRLA, EACs-clear, TMs-wnl, NOSE-clear, THROAT-clear & wnl. NECK:  Supple w/ fair ROM; no JVD; normal carotid impulses w/o bruits; no thyromegaly or nodules palpated; no lymphadenopathy. CHEST:  Clear to P & A; without wheezes/ rales/ or rhonchi heard... chest wall is tender & reproduces her pain... HEART:  Regular Rhythm; without murmurs/ rubs/ or gallops detected... ABDOMEN:  Soft & nontender;  normal bowel sounds; no organomegaly or masses palpated...neg CVA tenderness  EXT: without deformities, mild arthritic changes a  no varicose veins/ venous insuffic/ or edema.       Assessment & Plan:

## 2010-12-07 NOTE — Progress Notes (Signed)
Addended by: Abigail Miyamoto D on: 12/07/2010 01:49 PM   Modules accepted: Orders

## 2010-12-07 NOTE — Telephone Encounter (Signed)
Pt aware of TP recs. Pt is coming in at 11 this morning. Pt aware to leave UA sample prior to OV.

## 2010-12-07 NOTE — Telephone Encounter (Signed)
I spoke with pt and she thinks she may have a bladder infection. Pt c/o burning sensation, pressure, increase urination (but very little at a time), and feels achy x yesterday. Pt has been taking AZO OTC but has had no relief.  Pt states it feels worse today. Pt is requesting recs on what else to do/take. Please advise, tammy, thanks  Allergies  Allergen Reactions  . Clarithromycin     REACTION: rash  . Codeine     REACTION: n/v  . Lisinopril     REACTION: Developed ACE cough...  . Penicillins     REACTION: itchy rash  . Simvastatin     REACTION: pt states INTOL to STATINS \\T \ refuses to restart  . Trilipix     REACTION: pt states INTOL to Trilipix w/ "thigh burning"  . Ceclor (Cefaclor) Rash  . Doxycycline Rash  . Tobradex Rash    Carver Fila, CMA

## 2010-12-07 NOTE — Patient Instructions (Signed)
Cipro 250mg  Twice daily  For 7 days  Urinate frequently, empty bladder completely Increase water intake.  Please contact office for sooner follow up if symptoms do not improve or worsen or seek emergency care

## 2010-12-07 NOTE — Telephone Encounter (Signed)
Can put her in one of my slot this am, tell her to come now w/ urine w/ micro and urine cx prior to being seen.

## 2010-12-09 LAB — URINE CULTURE

## 2010-12-20 ENCOUNTER — Encounter: Payer: Self-pay | Admitting: Cardiovascular Disease

## 2010-12-20 ENCOUNTER — Other Ambulatory Visit: Payer: Self-pay

## 2010-12-20 DIAGNOSIS — C50519 Malignant neoplasm of lower-outer quadrant of unspecified female breast: Secondary | ICD-10-CM

## 2010-12-20 HISTORY — DX: Malignant neoplasm of lower-outer quadrant of unspecified female breast: C50.519

## 2010-12-21 ENCOUNTER — Other Ambulatory Visit: Payer: Self-pay | Admitting: Radiology

## 2010-12-21 DIAGNOSIS — C50912 Malignant neoplasm of unspecified site of left female breast: Secondary | ICD-10-CM

## 2010-12-24 ENCOUNTER — Ambulatory Visit
Admission: RE | Admit: 2010-12-24 | Discharge: 2010-12-24 | Disposition: A | Discharge status: Home / Self Care | Payer: Medicare Other | Source: Ambulatory Visit | Attending: Radiology | Admitting: Radiology

## 2010-12-24 DIAGNOSIS — C50912 Malignant neoplasm of unspecified site of left female breast: Secondary | ICD-10-CM

## 2010-12-24 MED ORDER — GADOBENATE DIMEGLUMINE 529 MG/ML IV SOLN
12.0000 mL | Freq: Once | INTRAVENOUS | Status: AC | PRN
  Administered 2010-12-24: 12 mL via INTRAVENOUS

## 2010-12-25 ENCOUNTER — Encounter (INDEPENDENT_AMBULATORY_CARE_PROVIDER_SITE_OTHER): Payer: Self-pay | Admitting: Surgery

## 2010-12-26 ENCOUNTER — Ambulatory Visit (HOSPITAL_BASED_OUTPATIENT_CLINIC_OR_DEPARTMENT_OTHER): Payer: Medicare Other | Admitting: Surgery

## 2010-12-26 ENCOUNTER — Other Ambulatory Visit: Payer: Self-pay | Admitting: Oncology

## 2010-12-26 ENCOUNTER — Encounter (INDEPENDENT_AMBULATORY_CARE_PROVIDER_SITE_OTHER): Payer: Self-pay | Admitting: Surgery

## 2010-12-26 ENCOUNTER — Encounter (HOSPITAL_BASED_OUTPATIENT_CLINIC_OR_DEPARTMENT_OTHER): Payer: Medicare Other | Admitting: Oncology

## 2010-12-26 ENCOUNTER — Ambulatory Visit: Payer: Medicare Other | Attending: Surgery | Admitting: Physical Therapy

## 2010-12-26 VITALS — BP 179/78 | HR 64 | Temp 97.9°F | Resp 20 | Ht 59.0 in | Wt 130.7 lb

## 2010-12-26 DIAGNOSIS — C50519 Malignant neoplasm of lower-outer quadrant of unspecified female breast: Secondary | ICD-10-CM

## 2010-12-26 DIAGNOSIS — M25619 Stiffness of unspecified shoulder, not elsewhere classified: Secondary | ICD-10-CM | POA: Insufficient documentation

## 2010-12-26 DIAGNOSIS — C50919 Malignant neoplasm of unspecified site of unspecified female breast: Secondary | ICD-10-CM | POA: Insufficient documentation

## 2010-12-26 DIAGNOSIS — IMO0001 Reserved for inherently not codable concepts without codable children: Secondary | ICD-10-CM | POA: Insufficient documentation

## 2010-12-26 LAB — COMPREHENSIVE METABOLIC PANEL
ALT: 22 U/L (ref 0–35)
AST: 22 U/L (ref 0–37)
Albumin: 4 g/dL (ref 3.5–5.2)
BUN: 12 mg/dL (ref 6–23)
CO2: 29 mEq/L (ref 19–32)
Calcium: 9.7 mg/dL (ref 8.4–10.5)
Chloride: 95 mEq/L — ABNORMAL LOW (ref 96–112)
Creatinine, Ser: 0.58 mg/dL (ref 0.50–1.10)
Potassium: 3.8 mEq/L (ref 3.5–5.3)

## 2010-12-26 LAB — CBC WITH DIFFERENTIAL/PLATELET
BASO%: 1.2 % (ref 0.0–2.0)
Basophils Absolute: 0.1 10*3/uL (ref 0.0–0.1)
EOS%: 2.8 % (ref 0.0–7.0)
HCT: 39.6 % (ref 34.8–46.6)
HGB: 13.3 g/dL (ref 11.6–15.9)
MCH: 29.5 pg (ref 25.1–34.0)
MONO#: 0.5 10*3/uL (ref 0.1–0.9)
NEUT%: 38.4 % (ref 38.4–76.8)
RDW: 15.3 % — ABNORMAL HIGH (ref 11.2–14.5)
WBC: 6.4 10*3/uL (ref 3.9–10.3)
lymph#: 3.2 10*3/uL (ref 0.9–3.3)

## 2010-12-26 NOTE — Patient Instructions (Signed)
We can schedule your surgery after we have that "okay "from your cardiologist. We will plan to do a lumpectomy for your breast cancer. We will also be checking for a sentinel lymph node or nodes. We should call you after we have heard from the cardiologist.

## 2010-12-26 NOTE — Progress Notes (Signed)
NAME: [name]                                                                                      DOB: Feb 13, 1936 DATE: 12/26/2010               MRN: [id number]   CC:  Chief Complaint  Patient presents with  . Breast Cancer    HPI:  [name] is a 75 y.o.  female who presents with a newly diagnosed left breast cancer lower outer quadrant. This was diagnosed on a routine mammogram. Apparently the technician was able to palpate the mass but the patient was unaware of its presence. She's not had a prior breast problems  The mass was biopsied and this is an invasive ductal carcinoma receptor positive but with a Ki-67 of 72%. HER-2/neu was negative. She presents to the breast multidisciplinary clinic for evaluation and management recommendations.  PMH:   has a past medical history of Unspecified glaucoma; Allergic rhinitis, cause unspecified; Unspecified asthma; Unspecified essential hypertension; CAD (coronary artery disease); Other and unspecified hyperlipidemia; Hiatal hernia; Esophageal reflux; Gastritis; Abdominal pain, left lower quadrant; Diverticulosis of colon (without mention of hemorrhage); Irritable bowel syndrome; Colonic polyp; Fibromyalgia; Headache; Dizziness; and Breast cancer, Left (12/20/2010).  PSH:   has past surgical history that includes Coronary angioplasty with stent; open heart surgery; and Coronary artery bypass graft.  ALLERGIES:   Allergies  Allergen Reactions  . Clarithromycin     REACTION: rash  . Codeine     REACTION: n/v  . Lisinopril     REACTION: Developed ACE cough...  . Penicillins     REACTION: itchy rash  . Simvastatin     REACTION: pt states INTOL to STATINS \\T \ refuses to restart  . Trilipix     REACTION: pt states INTOL to Trilipix w/ "thigh burning"  . Ceclor (Cefaclor) Rash  . Doxycycline Rash  . Tobradex Rash    MEDICATIONS:   Current Outpatient Prescriptions  Medication Sig Dispense Refill  . aspirin 81 MG  tablet Take 81 mg by mouth daily.        . Coenzyme Q10 (COQ10) 100 MG CAPS Take 1 capsule by mouth daily.        . fexofenadine (ALLEGRA) 180 MG tablet Take 180 mg by mouth daily.        . Glucosamine-Chondroit-Vit C-Mn (GLUCOSAMINE CHONDR 1500 COMPLX PO) Take 1 tablet by mouth 3 (three) times daily.        [name] losartan-hydrochlorothiazide (HYZAAR) 100-12.5 MG per tablet take 1 tablet by mouth once daily  30 tablet  12  . metoprolol (LOPRESSOR) 50 MG tablet take 1 tablet by mouth twice a day  60 tablet  10  . mometasone (NASONEX) 50 MCG/ACT nasal spray 2 sprays by Nasal route daily.        . nitroGLYCERIN (NITROSTAT) 0.4 MG SL tablet Take one tablet  By mouth SL every 5 minutes for a total of 3 doses as needed for chest pain.  25 tablet  3  . Omega-3 Fatty Acids (FISH OIL) 1200 MG CAPS 2 tabs po bid       .  omeprazole (PRILOSEC) 40 MG capsule take 1 capsule by mouth once daily 30 MINUTES BEFORE THE FIRST MEAL OF THE DAY  30 capsule  6  . rosuvastatin (CRESTOR) 10 MG tablet Take 1 tablet (10 mg total) by mouth daily. 1 tab po daily( except on Tuesday and Thursday no tabs )  and on Wednesday 1/2 tab daily  30 tablet  6  . traMADol (ULTRAM) 50 MG tablet Take 1 tablet (50 mg total) by mouth every 6 (six) hours as needed.  30 tablet  1  . travoprost, benzalkonium, (TRAVATAN) 0.004 % ophthalmic solution 1 drop at bedtime.        . vitamin B-12 (CYANOCOBALAMIN) 1000 MCG tablet Take 1,000 mcg by mouth daily.        . Vitamin D, Ergocalciferol, (DRISDOL) 50000 UNITS CAPS Take 50,000 Units by mouth every 30 (thirty) days. Once a month        ROS: She has filled out the review of systems form. It is negative with exception of problems with coughing some abdominal pain (has been diagnosed with gallstones but advised to wait for more symptoms before surgery) some arthritis pain headaches some mild forgetfulness.  EXAM:   GENERAL:  The patient is alert, oriented, and generally healthy-appearing, NAD. Mood and  affect are normal.  HEENT:  The head is normocephalic, the eyes nonicteric, the pupils were round regular and equal. EOMs are normal. Pharynx normal. Dentition good.  NECK:  The neck is supple and there are no masses or thyromegaly.  LUNGS: Normal respirations and clear to auscultation.  HEART: Regular rhythm, with no murmurs rubs or gallops. Pulses are intact carotid dorsalis pedis and posterior tibial. No significant varicosities are noted.  BREASTS:  the breasts are symmetric in appearance. In the lower outer quadrant of the left breast is a firm mass with some surrounding ecchymosis. It does not appear to be fixed to skin or deeper.the remainder of the breast is unremarkable.  LYMPHATICS: There is no axillary or suprapubic or adenopathy noted.  ABDOMEN: Soft, flat, and nontender. No masses or organomegaly is noted. No hernias are noted. Bowel sounds are normal.  EXTREMITIES:  Good range of motion, no edema.   DATA REVIEWED:   I seen her mammogram films and reports, MRI films and reports, pathology slides and reports.  IMPRESSION:  Clinical stage I left breast cancer lower outer quadrant receptor positive  PLAN:   I think she is a good candidate for a lumpectomy and sentinel node evaluation. She is very seen a medical oncologist. She understands that she will need radiation therapy I have discussed the indications for the lumpectomy and described the procedure. She understand that the chance of removal of the abnormal area is very good, but that occasionally we are unable to locate it and may have to do a second procedure. We also discussed the possibility of a second procedure to get additional tissue. Risks of surgery such as bleeding and infection have also been explained, as well as the implications of not doing the surgery. She understands and wishes to proceed.

## 2010-12-27 ENCOUNTER — Encounter (INDEPENDENT_AMBULATORY_CARE_PROVIDER_SITE_OTHER): Payer: Self-pay | Admitting: General Surgery

## 2010-12-28 ENCOUNTER — Other Ambulatory Visit (INDEPENDENT_AMBULATORY_CARE_PROVIDER_SITE_OTHER): Payer: Self-pay | Admitting: Surgery

## 2010-12-28 DIAGNOSIS — C50912 Malignant neoplasm of unspecified site of left female breast: Secondary | ICD-10-CM

## 2010-12-31 ENCOUNTER — Other Ambulatory Visit (INDEPENDENT_AMBULATORY_CARE_PROVIDER_SITE_OTHER): Payer: Self-pay | Admitting: Surgery

## 2010-12-31 ENCOUNTER — Telehealth: Payer: Self-pay | Admitting: Cardiovascular Disease

## 2010-12-31 NOTE — Telephone Encounter (Signed)
NM:  Pt having surgery on Nov 26 and has a question about some of her medication and also if she needs to keep appt for her test tomorrow.  Please call her [phone]. Til noon.  [phone] after lunch.

## 2010-12-31 NOTE — Telephone Encounter (Signed)
Spoke with pt, she is aware the paperwork form the surgeons was received today. Dr Eden Emms will be back in the office Thursday and will let her know after he reviews Deliah Goody

## 2010-12-31 NOTE — H&P (Signed)
[name]  Description:  75 year old female  12/26/2010 10:00 AM Office Visit Provider:  [name], MD  MRN: [id number] Department:  [facility]            Diagnoses  Reason for Visit    Cancer of lower-outer quadrant of female breast - Primary  Breast Cancer   174.5            Vitals - Last Recorded       BP  Pulse  Temp  Resp  Ht  Wt    179/78  64  97.9 F (36.6 C)  20  4\' 11"  (1.499 m)  130 lb 11.2 oz (59.285 kg)           BMI               26.40 kg/m2                   Progress Notes     [name], MD 12/26/2010 4:23 PM Signed  NAME: [name] DOB: 04/09/1935  DATE: 12/26/2010 MRN: [id number]  CC:     Chief Complaint     Patient presents with     .  Breast Cancer      HPI:  [name] is a 75 y.o. female who presents with a newly diagnosed left breast cancer lower outer quadrant. This was diagnosed on a routine mammogram. Apparently the technician was able to palpate the mass but the patient was unaware of its presence. She's not had a prior breast problems  The mass was biopsied and this is an invasive ductal carcinoma receptor positive but with a Ki-67 of 72%. HER-2/neu was negative. She presents to the breast multidisciplinary clinic for evaluation and management recommendations.  PMH:  has a past medical history of Unspecified glaucoma; Allergic rhinitis, cause unspecified; Unspecified asthma; Unspecified essential hypertension; CAD (coronary artery disease); Other and unspecified hyperlipidemia; Hiatal hernia; Esophageal reflux; Gastritis; Abdominal pain, left lower quadrant; Diverticulosis of colon (without mention of hemorrhage); Irritable bowel syndrome; Colonic polyp; Fibromyalgia; Headache; Dizziness; and Breast cancer, Left (12/20/2010).  PSH:  has past surgical history that includes Coronary angioplasty with stent; open heart surgery; and Coronary artery bypass graft.  ALLERGIES:      Allergies     Allergen  Reactions     .  Clarithromycin        REACTION: rash     .  Codeine        REACTION: n/v     .  Lisinopril        REACTION: Developed ACE cough...     .  Penicillins        REACTION: itchy rash     .  Simvastatin        REACTION: pt states INTOL to STATINS \\T \ refuses to restart     .  Trilipix        REACTION: pt states INTOL to Trilipix w/ "thigh burning"     .  Ceclor (Cefaclor)  Rash     .  Doxycycline  Rash     .  Tobradex  Rash      MEDICATIONS:     Current Outpatient Prescriptions     Medication  Sig  Dispense  Refill     .  aspirin 81 MG tablet  Take 81 mg by mouth daily.       .  Coenzyme Q10 (  COQ10) 100 MG CAPS  Take 1 capsule by mouth daily.       .  fexofenadine (ALLEGRA) 180 MG tablet  Take 180 mg by mouth daily.       .  Glucosamine-Chondroit-Vit C-Mn (GLUCOSAMINE CHONDR 1500 COMPLX PO)  Take 1 tablet by mouth 3 (three) times daily.       [name]  losartan-hydrochlorothiazide (HYZAAR) 100-12.5 MG per tablet  take 1 tablet by mouth once daily  30 tablet  12     .  metoprolol (LOPRESSOR) 50 MG tablet  take 1 tablet by mouth twice a day  60 tablet  10     .  mometasone (NASONEX) 50 MCG/ACT nasal spray  2 sprays by Nasal route daily.       .  nitroGLYCERIN (NITROSTAT) 0.4 MG SL tablet  Take one tablet By mouth SL every 5 minutes for a total of 3 doses as needed for chest pain.  25 tablet  3     .  Omega-3 Fatty Acids (FISH OIL) 1200 MG CAPS  2 tabs po bid       .  omeprazole (PRILOSEC) 40 MG capsule  take 1 capsule by mouth once daily 30 MINUTES BEFORE THE FIRST MEAL OF THE DAY  30 capsule  6     .  rosuvastatin (CRESTOR) 10 MG tablet  Take 1 tablet (10 mg total) by mouth daily. 1 tab po daily( except on Tuesday and Thursday no tabs ) and on Wednesday 1/2 tab daily  30 tablet  6     .  traMADol (ULTRAM) 50 MG tablet  Take 1 tablet (50 mg total) by mouth every 6 (six) hours as needed.  30 tablet  1     .  travoprost, benzalkonium, (TRAVATAN) 0.004 %  ophthalmic solution  1 drop at bedtime.       .  vitamin B-12 (CYANOCOBALAMIN) 1000 MCG tablet  Take 1,000 mcg by mouth daily.       .  Vitamin D, Ergocalciferol, (DRISDOL) 50000 UNITS CAPS  Take 50,000 Units by mouth every 30 (thirty) days. Once a month        ROS:  She has filled out the review of systems form. It is negative with exception of problems with coughing some abdominal pain (has been diagnosed with gallstones but advised to wait for more symptoms before surgery) some arthritis pain headaches some mild forgetfulness.  EXAM:  GENERAL:  The patient is alert, oriented, and generally healthy-appearing, NAD. Mood and affect are normal.  HEENT:  The head is normocephalic, the eyes nonicteric, the pupils were round regular and equal. EOMs are normal. Pharynx normal. Dentition good.  NECK:  The neck is supple and there are no masses or thyromegaly.  LUNGS:  Normal respirations and clear to auscultation.  HEART:  Regular rhythm, with no murmurs rubs or gallops. Pulses are intact carotid dorsalis pedis and posterior tibial. No significant varicosities are noted.  BREASTS:  the breasts are symmetric in appearance. In the lower outer quadrant of the left breast is a firm mass with some surrounding ecchymosis. It does not appear to be fixed to skin or deeper.the remainder of the breast is unremarkable.  LYMPHATICS:  There is no axillary or suprapubic or adenopathy noted.  ABDOMEN:  Soft, flat, and nontender. No masses or organomegaly is noted. No hernias are noted. Bowel sounds are normal.  EXTREMITIES:  Good range of motion, no edema.  DATA REVIEWED:  I seen her mammogram films  and reports, MRI films and reports, pathology slides and reports.  IMPRESSION:  Clinical stage I left breast cancer lower outer quadrant receptor positive  PLAN:  I think she is a good candidate for a lumpectomy and sentinel node evaluation. She is very seen a medical oncologist. She understands that she will  need radiation therapy  I have discussed the indications for the lumpectomy and described the procedure. She understand that the chance of removal of the abnormal area is very good, but that occasionally we are unable to locate it and may have to do a second procedure. We also discussed the possibility of a second procedure to get additional tissue. Risks of surgery such as bleeding and infection have also been explained, as well as the implications of not doing the surgery.  She understands and wishes to proceed.        Referring Provider          Michele Mcalpine, MD            All Charges for This Encounter       Code  Description  Service Date  Service Provider  Modifiers  Quantity    [phone]  PR OFFICE/OUTPT VISIT,NEW,LEVL IV  12/26/2010  [name], MD   1    [phone]  PR PT VIS [facility] USE EHR CER ATCB  12/26/2010  [name], MD   1    [phone]  PR CURRENT TOBACCO NON-USER  12/26/2010  [name], MD   1                Other Encounter Related Information     Allergies & Medications      Problem List      History      Patient-Entered Questionnaires       No data filed

## 2011-01-01 ENCOUNTER — Telehealth: Payer: Self-pay | Admitting: *Deleted

## 2011-01-01 ENCOUNTER — Encounter (INDEPENDENT_AMBULATORY_CARE_PROVIDER_SITE_OTHER): Payer: Medicare Other | Admitting: *Deleted

## 2011-01-01 DIAGNOSIS — I739 Peripheral vascular disease, unspecified: Secondary | ICD-10-CM

## 2011-01-01 NOTE — Telephone Encounter (Signed)
Message copied by Gerre Scull on Tue Jan 01, 2011  3:35 PM ------      Message from: Pershing Proud      Created: Tue Jan 01, 2011  2:08 PM      Regarding: [name],            Please remove appt with lab/magrinat on 11/30.  Her oncotype results will not be back for this appt.  Please keep 12/6 appt for lab/md.            Thanks,      [place]

## 2011-01-01 NOTE — Telephone Encounter (Signed)
Left VM for pt to return call regarding [facility] from 12/26/10.

## 2011-01-03 ENCOUNTER — Telehealth: Payer: Self-pay | Admitting: *Deleted

## 2011-01-03 NOTE — Telephone Encounter (Signed)
Spoke to pt concerning [facility] from 12/26/10.  Discussed appts and education materials.  Informed pt that surgeon will release her to start exercising after surgery.  Encourage pt to call with needs or concerns.  Received verbal understanding.  Contact information given.

## 2011-01-06 ENCOUNTER — Encounter: Payer: Self-pay | Admitting: *Deleted

## 2011-01-06 NOTE — Progress Notes (Signed)
Mailed after appt letter to pt. 

## 2011-01-07 ENCOUNTER — Encounter: Payer: Self-pay | Admitting: Adult Health

## 2011-01-07 ENCOUNTER — Ambulatory Visit (INDEPENDENT_AMBULATORY_CARE_PROVIDER_SITE_OTHER): Payer: Medicare Other | Admitting: Adult Health

## 2011-01-07 DIAGNOSIS — C50519 Malignant neoplasm of lower-outer quadrant of unspecified female breast: Secondary | ICD-10-CM

## 2011-01-07 DIAGNOSIS — J309 Allergic rhinitis, unspecified: Secondary | ICD-10-CM

## 2011-01-07 DIAGNOSIS — C50919 Malignant neoplasm of unspecified site of unspecified female breast: Secondary | ICD-10-CM

## 2011-01-07 MED ORDER — AZITHROMYCIN 250 MG PO TABS: ORAL_TABLET | ORAL | Status: DC

## 2011-01-07 MED ORDER — BUDESONIDE-FORMOTEROL FUMARATE 80-4.5 MCG/ACT IN AERO: 2.0000 | INHALATION_SPRAY | Freq: Two times a day (BID) | RESPIRATORY_TRACT | Status: DC

## 2011-01-07 NOTE — Patient Instructions (Signed)
Z-Pack Take as directed.  Mucinex DM Twice daily  As needed  Cough/congestion  Allegra 180mg  daily As needed  Drainage.  Saline nasal rinses As needed   Fluids and rest  Please contact office for sooner follow up if symptoms do not improve or worsen or seek emergency care

## 2011-01-08 NOTE — Assessment & Plan Note (Signed)
Flare with URI   Plan;  Z-Pack Take as directed.  Mucinex DM Twice daily  As needed  Cough/congestion  Allegra 180mg  daily As needed  Drainage.  Saline nasal rinses As needed   Fluids and rest  Please contact office for sooner follow up if symptoms do not improve or worsen or seek emergency care

## 2011-01-08 NOTE — Assessment & Plan Note (Signed)
Encouragement provided Cont follow up with surgeon/oncology /onc/rad as planned

## 2011-01-08 NOTE — Progress Notes (Signed)
Subjective:    Patient ID: [name], female    DOB: 1935/05/03, 75 y.o.   MRN: [id number]  HPI  75 y/o WF here for a follow up visit... she has multiple medical problems including AR & Asthma;  HBP;  CAD followed by Walker Kehr & s/p 3 vessel CABG 11/11 by DrGearhardt;  Hyperlipidemia followed in the [facility];  GERD/ IBS/ Divertics;  Fibromyalgia;  Chr HAs & dizziness...  ~  May 04, 2009:  presents w/ HA- posterior pain> band-like c/w muscle contraction HA... she under some stress dealing w/ father's estate... she went to the ER w/ CP & elevated BP> she ruled out for MI & was started on HYZAAR 100-12.5 by DrNishan in f/u... she has a repeat Myoview pending next week (it was neg)... she continues her Lipid Management by Walker Kehr & the [facility] on [place], FlaxSeedOil, & diet...  she had f/u colonoscopy3/11 by DrPatterson> divertics, 1 polyp= tubular adenoma, f/u 58yrs...   ~  Jul 25, 2009:  she's noted some incr SOB, wheezing over the last 2 weeks w/ the heat... we discussed using the Symbicort, Proair, etc... she saw Walker Kehr 5/11- doing well, stopped her Plavix, no angina & non-ischemic Myoview 3/11... BP controlled on meds;  continues on FishOil & FlaxSeesOil for her Chol (intol to all other meds) followed by the clinic but FLP remains deranged w/ TChol 277 & LDL 193;  we will give her a TDAP today...  ~  December 19, 2009:  presents for add-on visit due to chest discomfort that started after picking greens recommended by the [facility]... she got scared & took NTG thinking it might be esoph spasm- no relief from this or Levsin called in by [employer]... she notes chest wall tender & pain incr w/ activity/ movement; and some relief after heat & Vicodin... exam shows tender chest wall, clear lungs, reg rhythm & we discussed rest, heat, analgesics... BP remains controlled on meds;  Chol improved on Cres10 twice per week via lipid clinic f/u;  GI appears stable overall;  hx FM/ HAs/ dizzy, etc...  OK Flu shot.  ~  May 08, 2010:  [name] has had a busy 81mo> epis of CP, cath w/ in-stent restenosis, then CABG x3 11/11 by DrGearhardt... now c/o refractory URI x 6wks w/ sinusitis/ bronchitis/ laryngitis & rx w/ ZPak, Biaxin, etc from DrESL "I'm allergic" she says... finally received Avelox/ Medrol & improved...     AR/ Asthma>  DrESL stopped her Symbicort in favor of QVar...    HBP>  controlled on Toprol & Hyzaar w/ BP 130/70 today... denies visual changes, CP, palipit, dizziness, syncope, dyspnea, edema, etc...     CAD>  s/p CABG as noted & saw both DrNishan & DrGearhardt 12/11 (notes reviewed)...    Hyperlipid>  followed in the [facility] & FLP 2/12 reviewed on Cres10 2d per week...    Others>  she has persistant FM symptoms and chr HAs, uses Vicodin Prn...  ~  Sep 26, 2010:  63mo ROV & she was re-hospitalized 6/12 by [facility] for CP> cath showed severe 3 vessel CAD w/ 3/3 grafts patent & norm LVF w/ EF= 65-70%; no change in meds; she states "they don't know what it was"; she saw [place] 7/12 in office f/u & he noted Rx limited by side effects, continued same regimen...  Her CC remains various aches & pains, notes breathing is good w/o cough, sputum, SOB, etc;  Notes GI w/u DrPatterson prev w/ Gallstones...  ~10/05/10  Acute ov  Pt presents for recurrent facial rash for 6 months . Complains of red, itching rash on face with swelling x2days - reports has had this before.  has treated with benadryl and allegra.  has finished pred taper given by Dr [name] for same symptoms this week. Started 6 months ago with rash on cheeks that comes and goes. She had irritation on eyes and was treated with cream then face broke out. Now has on/off rash on face with red eyes. Seen by Dr. [name] in allergy clinic tx w/ steroid taper. With no improvement. Face feels puffy at times. She denies new meds, skin products, pets, travel or injury. No mouth/lips/tongue swelling. No dypsnea , chest pain or dysphagia.    12/07/2010 Acute  OV  Pt complains of pressure and burning when urinating along with lower back pain. x1 day.   No vomitting or diarrhea. No abdominal pain or fever. Took AZO with some help.  Last UTI was years ago . No hematuria  >>Cipro x 7 day  01/08/2011 Acute OV  Complains of sinus pressure/congestion, PND, prod cough with clear/yellow mucus, hoarseness x3days. OTC not working. Cough and congestion are getting worse.  She has recently been dx with breast cancer. She is scheduled for lumpectomy in couple of weeks by Dr. Jamey [name].  She does not want to be sick for the surgery. She has been seen by oncology and onc/rad pending with tx plans to be decided upon lumpectomy results.           Problem List:  GLAUCOMA (ICD-365.9) - she is sched for right cataract surg by DrBevis in Feb...  ALLERGIC RHINITIS (ICD-477.9) - she uses Alavert & Nasonex Prn; followed by DrESL...  ASTHMA (ICD-493.90) - prev on Symbicort80 and improved, but only using it Prn; DrLeBauer switched her to Dulera100, but this is not currently on her list. ~  5/11:  notes incr chest symptoms in the heat & rec to take the Symbicort 2sp Bid... ~  2012:  After her CABG & repeat hosp by [facility] she is noted to be off her inhalers...  HYPERTENSION (ICD-401.9) - controlled on TOPROL 50mg Bid & HYZAAR 100-12.5 daily (off Lisinopril due to ACE cough)..   CORONARY ARTERY DISEASE (ICD-414.00) - on ASA 81mg /d (intol to 325 she says) & off Plavix per Walker Kehr...  Cardiolite 9/08 showed CP & HBP response, EF+85%... had cath 10/08 w/ single vessel dis w/ mod ostial LAD stenosis and patent LAD stent, normal LVF.[name]. option for med Rx vs off pump LIMA... she notes some CP/ pressure "burning" when walking up a certain hill during her daily walks, but states this is stable and "no worse"... ~  Myoview 9/09 was neg- no scar or ischemia, EF= 89%, hypertensive response- Lisinopril10 added. ~  f/u Myoview 3/11 was neg- no scar or ischemia, EF= 86%, norm BP response, fair  exerc capacity. ~  saw Walker Kehr 5/11- no CP, he stopped her Plavix, stable- continue other meds same. ~  She had recurrent CP 11/11 w/ in-stent restenosis on cath> subseq 3 vessel CABG by DrGearhart & doing well since then... ~  6/12:  Hosp w/ CP & repeat cath showed CAD & 3/3 grafts patent, EF= 65-70%  HYPERLIPIDEMIA (ICD-272.4) - on CRESTOR 10mg  2 days per week, FISH OIL 1200mg  Tid + Flax Seed Oil... she has been intol to statins in the past>  tried low dose Trilipix but stopped this due to "thigh burning">  now followed in the [facility]--- ~  FLP 7/08 showed TChol  217, TG 236, HDL 46, LDL 150...  ~  FLP 6/09 showed TChol 233, TG 263, HDL 46, LDL 150... she agrees to try LOW DOSE Trilipix. ~  FLP 11/09 showed TChol 162, TG 167, HDL 45, LDL 83... ~  FLP 11/10 showed TChol 240, TG 271, HDL 45, LDL 150... [facility] Rx w/ FishOil & FlaxSeedOil. ~  FLP 2/11 showed TChol 285, TG 156, HDL 79, LDL 180... LC is aware & working w/ her regularly. ~  FLP 5/11 showed TChol 277, TG 241, HDL55, LDL 193...  LC added Cres10 just 2d per week. ~  FLP 10/11 showed TChol 198, TG 215, HDL 53, LDL 109... much improved on Cres10 twice weekly. ~  SEE [facility] CLINIC follow up Lipid Profiles...  GERD (ICD-530.81) - on OMEPRAZOLE 40mg /d... last EGD by DrPatterson 8/07 revealed sm HH, & gastritis and dilatation done... (RUT=neg, PPI Rx)... LEVSIN 0.125mg  helps her esoph spasm... Prn Phenergan for nausea.  DIVERTICULOSIS, COLON (ICD-562.10) & IRRITABLE BOWEL SYNDROME (ICD-564.1) ~  colonoscopy 2/03 by DrPatterson showed divertics and 5 mm polyp... ~  colonoscopy 3/11 showed divertics & cecal polyp= tubular adenoma, w/ f/u planned 66yrs.  GALLSTONES>>  Diagnosed by DrPatterson and referred to [facility] DrWilson, pt asked to call prn fopr incr symptoms to consider surg...  FIBROMYALGIA (ICD-729.1) - she c/o chr fatigue, aching/ sore, on Tramadol & Skelaxin, but most benefit from low dose Hydrocodone ~1/2 tab Prn... I have  recommended an increase exercise program to her... ~  5/11:  Vit D level = 50 on 50000 u weekly by Rock Nephew, NP  HEADACHE (ICD-784.0) - eval at [facility] clinic in 2002, but she notes Rx didn't help... ~  3/11:  presented w/ muscle contraction HA's> Rx Tramadol 50mg  Q6H Prn.  DIZZINESS, CHRONIC (ICD-780.4) - MRI 2/09 per Walker Kehr showed atrophy, sm vessel dis, NAD...  Health Maintenance: ~  GI:  DrPatterson & up to date on colon screening. ~  GYN:  yearly f/u w/ Rock Nephew, NP for PAP, Mammogram at [facility], ?last BMD. ~  Immunizations:  yearly Flu vaccines in fall, Pneumovax in 2007?, TDAP given 5/11...   Past Surgical History  Procedure Date  . Coronary angioplasty with stent placement     Stent 2007  . Open heart surgery     01/19/2010  . Coronary artery bypass graft     Outpatient Encounter Prescriptions as of 01/07/2011  Medication Sig Dispense Refill  . aspirin 81 MG tablet Take 81 mg by mouth daily.        . Coenzyme Q10 (COQ10) 100 MG CAPS Take 1 capsule by mouth daily.        . fexofenadine (ALLEGRA) 180 MG tablet Take 180 mg by mouth daily.        . Glucosamine-Chondroit-Vit C-Mn (GLUCOSAMINE CHONDR 1500 COMPLX PO) Take 1 tablet by mouth 3 (three) times daily.        [name] losartan-hydrochlorothiazide (HYZAAR) 100-12.5 MG per tablet take 1 tablet by mouth once daily  30 tablet  12  . metoprolol (LOPRESSOR) 50 MG tablet take 1 tablet by mouth twice a day  60 tablet  10  . mometasone (NASONEX) 50 MCG/ACT nasal spray 2 sprays by Nasal route daily.        . nitroGLYCERIN (NITROSTAT) 0.4 MG SL tablet Take one tablet  By mouth SL every 5 minutes for a total of 3 doses as needed for chest pain.  25 tablet  3  . Omega-3 Fatty Acids (FISH OIL) 1200 MG CAPS  2 tabs po bid       . omeprazole (PRILOSEC) 40 MG capsule take 1 capsule by mouth once daily 30 MINUTES BEFORE THE FIRST MEAL OF THE DAY  30 capsule  6  . rosuvastatin (CRESTOR) 10 MG tablet Take 1 tablet (10 mg total) by  mouth daily. 1 tab po daily( except on Tuesday and Thursday no tabs )  and on Wednesday 1/2 tab daily  30 tablet  6  . traMADol (ULTRAM) 50 MG tablet Take 1 tablet (50 mg total) by mouth every 6 (six) hours as needed.  30 tablet  1  . travoprost, benzalkonium, (TRAVATAN) 0.004 % ophthalmic solution Place 1 drop into both eyes at bedtime.       . vitamin B-12 (CYANOCOBALAMIN) 1000 MCG tablet Take 1,000 mcg by mouth daily.        . Vitamin D, Ergocalciferol, (DRISDOL) 50000 UNITS CAPS Take 50,000 Units by mouth every 30 (thirty) days. Once a month      . azithromycin (ZITHROMAX Z-PAK) 250 MG tablet Take 2 tablets (500 mg) on  Day 1,  followed by 1 tablet (250 mg) once daily on Days 2 through 5.  6 each  0  . budesonide-formoterol (SYMBICORT) 80-4.5 MCG/ACT inhaler Inhale 2 puffs into the lungs 2 (two) times daily.  1 Inhaler  12    Allergies  Allergen Reactions  . Clarithromycin     REACTION: rash  . Codeine     REACTION: n/v  . Lisinopril     REACTION: Developed ACE cough...  . Penicillins     REACTION: itchy rash  . Simvastatin     REACTION: pt states INTOL to STATINS \\T \ refuses to restart  . Trilipix     REACTION: pt states INTOL to Trilipix w/ "thigh burning"  . Ceclor (Cefaclor) Rash  . Doxycycline Rash  . Tobradex Rash    Current Medications, Allergies, Past Medical History, Past Surgical History, Family History, and Social History were reviewed in [employer] record.    Review of Systems Constitutional:   No  weight loss, night sweats,  Fevers, chills,  ++fatigue, or  lassitude.  HEENT:   No headaches,  Difficulty swallowing,  Tooth/dental problems, or  Sore throat,                No sneezing, itching,  ++ear ache, nasal congestion, post nasal drip,   CV:  No chest pain,  Orthopnea, PND, swelling in lower extremities, anasarca, dizziness, palpitations, syncope.   GI  No heartburn, indigestion, abdominal pain, nausea, vomiting, diarrhea, change in  bowel habits, loss of appetite, bloody stools.   Resp: No shortness of breath with exertion or at rest.  No excess mucus, no productive cough,  No non-productive cough,  No coughing up of blood.  No change in color of mucus.  No wheezing.  No chest wall deformity  Skin: no rash or lesions.  GU: no dysuria, change in color of urine, no urgency or frequency.  No flank pain, no hematuria   MS:  No joint pain or swelling.  No decreased range of motion.  No back pain.  Psych:  No change in mood or affect. No depression or anxiety.  No memory loss.           Objective:   Physical Exam     WD, WN, 75 y/o WF in NAD... GENERAL:  Alert & oriented; pleasant & cooperative... HEENT:  [place]/AT, EOM-wnl, PERRLA, EACs-clear, TMs-wnl, NOSE-clear drainage, THROAT-clear &  wnl. NECK:  Supple w/ fair ROM; no JVD; normal carotid impulses w/o bruits; no thyromegaly or nodules palpated; no lymphadenopathy. CHEST:  Coarse BS  without wheezes/ rales/ or rhonchi heard... chest wall is tender & reproduces her pain... HEART:  Regular Rhythm; without murmurs/ rubs/ or gallops detected... ABDOMEN:  Soft & nontender; normal bowel sounds; no organomegaly or masses palpated  EXT: without deformities, mild arthritic changes a  no varicose veins/ venous insuffic/ or edema.       Assessment & Plan:

## 2011-01-09 ENCOUNTER — Telehealth: Payer: Self-pay | Admitting: Adult Health

## 2011-01-09 NOTE — Telephone Encounter (Signed)
Called and spoke with pt. Pt states she saw TP on Monday 01/07/11 but is getting worse.  States she is taking z pak, Mucinex DM, Allegra, Nasal saline rinses, warm salt water gargles.  Pt states her throat feels "full and raw."  C/o drainage, throat hurts to swallow or talk, hoarseness, increased coughing esp at night, coughing up thick clear to yellow to green colored sputum, temp of 99.5 this am, sinus pressure on R eye and ear.  Denies wheezing or tightness in chest.  Tp, Please advise of any recs.  Thanks. Allergies  Allergen Reactions  . Clarithromycin     REACTION: rash  . Codeine     REACTION: n/v  . Lisinopril     REACTION: Developed ACE cough...  . Penicillins     REACTION: itchy rash  . Simvastatin     REACTION: pt states INTOL to STATINS \\T \ refuses to restart  . Trilipix     REACTION: pt states INTOL to Trilipix w/ "thigh burning"  . Ceclor (Cefaclor) Rash  . Doxycycline Rash  . Tobradex Rash

## 2011-01-09 NOTE — Telephone Encounter (Signed)
Needs to give it some more time Encourage fluids, rest , and tylenol  Cont w/ ov recs  Please contact office for sooner follow up if symptoms do not improve or worsen or seek emergency care  '

## 2011-01-09 NOTE — Telephone Encounter (Signed)
Called and spoke with pt.  Informed her of TP's recs. Pt verbalized understanding and denied any questions.   

## 2011-01-11 ENCOUNTER — Ambulatory Visit (INDEPENDENT_AMBULATORY_CARE_PROVIDER_SITE_OTHER)
Admission: RE | Admit: 2011-01-11 | Discharge: 2011-01-11 | Disposition: A | Discharge status: Home / Self Care | Payer: Medicare Other | Source: Ambulatory Visit | Attending: Adult Health | Admitting: Adult Health

## 2011-01-11 ENCOUNTER — Ambulatory Visit (INDEPENDENT_AMBULATORY_CARE_PROVIDER_SITE_OTHER): Payer: Medicare Other | Admitting: Adult Health

## 2011-01-11 ENCOUNTER — Telehealth: Payer: Self-pay | Admitting: Pulmonary Disease

## 2011-01-11 ENCOUNTER — Encounter: Payer: Self-pay | Admitting: Adult Health

## 2011-01-11 VITALS — BP 120/80 | HR 79 | Temp 98.9°F | Ht 59.0 in | Wt 130.2 lb

## 2011-01-11 DIAGNOSIS — J45909 Unspecified asthma, uncomplicated: Secondary | ICD-10-CM

## 2011-01-11 MED ORDER — HYDROCODONE-HOMATROPINE 5-1.5 MG/5ML PO SYRP: 5.0000 mL | ORAL_SOLUTION | Freq: Four times a day (QID) | ORAL | Status: DC | PRN

## 2011-01-11 MED ORDER — PREDNISONE 10 MG PO TABS: ORAL_TABLET | ORAL | Status: AC

## 2011-01-11 NOTE — Patient Instructions (Addendum)
  Mucinex DM Twice daily  As needed  Cough/congestion  Prednisone taper over next week Saline nasal rinses As needed   Fluids and rest  Hydromet 1-2 tsp every 4-6 hr As needed  Cough, may make you sleepy  Please contact office for sooner follow up if symptoms do not improve or worsen or seek emergency care

## 2011-01-11 NOTE — Telephone Encounter (Signed)
OK TO OFFER APPT WITH TP THIS MORNING.  THANKS

## 2011-01-11 NOTE — Assessment & Plan Note (Addendum)
Slow to Flare with associated URI  cxr with no acute process Declined neb tx in office   Plan:  Mucinex DM Twice daily  As needed  Cough/congestion  Prednisone taper over next week Saline nasal rinses As needed   Fluids and rest  Hydromet 1-2 tsp every 4-6 hr As needed  Cough, may make you sleepy  Please contact office for sooner follow up if symptoms do not improve or worsen or seek emergency care

## 2011-01-11 NOTE — Telephone Encounter (Signed)
Pt is scheduled to come in today at 11:00 to see TP

## 2011-01-11 NOTE — Telephone Encounter (Signed)
I spoke with pt and she c/o cough w/ yellow to green thick phlem, fever 100.4, hoarseness, raw throat. Pt states she is worse than she was when she saw TP on 01/07/11. Pt states she finished the zpak this morning and is doing salt water rinses 3 times a day. Pt is requesting further recs from Dr. [name]. Please advise, thanks  Allergies  Allergen Reactions  . Clarithromycin     REACTION: rash  . Codeine     REACTION: n/v  . Lisinopril     REACTION: Developed ACE cough...  . Penicillins     REACTION: itchy rash  . Simvastatin     REACTION: pt states INTOL to STATINS \\T \ refuses to restart  . Trilipix     REACTION: pt states INTOL to Trilipix w/ "thigh burning"  . Ceclor (Cefaclor) Rash  . Doxycycline Rash  . Tobradex Rash

## 2011-01-11 NOTE — Progress Notes (Signed)
Subjective:    Patient ID: [name], female    DOB: Jul 22, 1935, 75 y.o.   MRN: [id number]  HPI  75 y/o WF here for a follow up visit... she has multiple medical problems including AR & Asthma;  HBP;  CAD followed by Walker Kehr & s/p 3 vessel CABG 11/11 by DrGearhardt;  [facility];  GERD/ IBS/ Divertics;  Fibromyalgia;  Chr HAs & dizziness...  ~  May 04, 2009:  presents w/ HA- posterior pain> band-like c/w muscle contraction HA... she under some stress dealing w/ father's estate... she went to the ER w/ CP & elevated BP> she ruled out for MI & was started on HYZAAR 100-12.5 by DrNishan in f/u... she has a repeat Myoview pending next week (it was neg)... she continues her Lipid Management by Walker Kehr & the [facility] on [place], FlaxSeedOil, & diet...  she had f/u colonoscopy3/11 by DrPatterson> divertics, 1 polyp= tubular adenoma, f/u 75yrs...   ~  Jul 25, 2009:  she's noted some incr SOB, wheezing over the last 2 weeks w/ the heat... we discussed using the Symbicort, Proair, etc... she saw Walker Kehr 5/11- doing well, stopped her Plavix, no angina & non-ischemic Myoview 3/11... BP controlled on meds;  continues on FishOil & FlaxSeesOil for her Chol (intol to all other meds) followed by the clinic but FLP remains deranged w/ TChol 277 & LDL 193;  we will give her a TDAP today...  ~  December 19, 2009:  presents for add-on visit due to chest discomfort that started after picking greens recommended by the [facility]... she got scared & took NTG thinking it might be esoph spasm- no relief from this or Levsin called in by [employer]... she notes chest wall tender & pain incr w/ activity/ movement; and some relief after heat & Vicodin... exam shows tender chest wall, clear lungs, reg rhythm & we discussed rest, heat, analgesics... BP remains controlled on meds;  Chol improved on Cres10 twice per week via lipid clinic f/u;  GI appears stable overall;  hx FM/ HAs/ dizzy, etc...  OK Flu shot.  ~  May 08, 2010:  [name] has had a busy 32mo> epis of CP, cath w/ in-stent restenosis, then CABG x3 11/11 by DrGearhardt... now c/o refractory URI x 6wks w/ sinusitis/ bronchitis/ laryngitis & rx w/ ZPak, Biaxin, etc from DrESL "I'm allergic" she says... finally received Avelox/ Medrol & improved...     AR/ Asthma>  DrESL stopped her Symbicort in favor of QVar...    HBP>  controlled on Toprol & Hyzaar w/ BP 130/70 today... denies visual changes, CP, palipit, dizziness, syncope, dyspnea, edema, etc...     CAD>  s/p CABG as noted & saw both DrNishan & DrGearhardt 12/11 (notes reviewed)...    Hyperlipid>  followed in the [facility] & FLP 2/12 reviewed on Cres10 2d per week...    Others>  she has persistant FM symptoms and chr HAs, uses Vicodin Prn...  ~  Sep 26, 2010:  6mo ROV & she was re-hospitalized 6/12 by [facility] for CP> cath showed severe 3 vessel CAD w/ 3/3 grafts patent & norm LVF w/ EF= 65-70%; no change in meds; she states "they don't know what it was"; she saw [place] 7/12 in office f/u & he noted Rx limited by side effects, continued same regimen...  Her CC remains various aches & pains, notes breathing is good w/o cough, sputum, SOB, etc;  Notes GI w/u DrPatterson prev w/ Gallstones...  ~10/05/10  Acute ov  Pt presents for recurrent facial rash for 6 months . Complains of red, itching rash on face with swelling x2days - reports has had this before.  has treated with benadryl and allegra.  has finished pred taper given by Dr [name] for same symptoms this week. Started 6 months ago with rash on cheeks that comes and goes. She had irritation on eyes and was treated with cream then face broke out. Now has on/off rash on face with red eyes. Seen by Dr. [name] in allergy clinic tx w/ steroid taper. With no improvement. Face feels puffy at times. She denies new meds, skin products, pets, travel or injury. No mouth/lips/tongue swelling. No dypsnea , chest pain or dysphagia.    12/07/2010 Acute  OV  Pt complains of pressure and burning when urinating along with lower back pain. x1 day.   No vomitting or diarrhea. No abdominal pain or fever. Took AZO with some help.  Last UTI was years ago . No hematuria  >>Cipro x 7 day  01/07/2011 Acute OV  Complains of sinus pressure/congestion, PND, prod cough with clear/yellow mucus, hoarseness x3days. OTC not working. Cough and congestion are getting worse.  She has recently been dx with breast cancer. She is scheduled for lumpectomy in couple of weeks by Dr. [name].  She does not want to be sick for the surgery. She has been seen by oncology and onc/rad pending with tx plans to be decided upon lumpectomy results.  >>rx zpck   01/11/2011 Acute OV  Complains of persistent cough and congestion .  Seen in office for URI on 11.12.12, given zpack .  Does not feel any better still has  sore throat, laryngitis, prod cough with green mucus, sinus congestion with clear - green drainage, HA, fever up tp 100.4.  finished zpak this AM. CXR today with no acute process. Is concerned that she hs upcoming surgery in 2 weeks  No chest pain or n/v/d.           Problem List:  GLAUCOMA (ICD-365.9) - she is sched for right cataract surg by DrBevis in Feb...  ALLERGIC RHINITIS (ICD-477.9) - she uses Alavert & Nasonex Prn; followed by DrESL...  ASTHMA (ICD-493.90) - prev on Symbicort80 and improved, but only using it Prn; DrLeBauer switched her to Dulera100, but this is not currently on her list. ~  5/11:  notes incr chest symptoms in the heat & rec to take the Symbicort 2sp Bid... ~  2012:  After her CABG & repeat hosp by [facility] she is noted to be off her inhalers...  HYPERTENSION (ICD-401.9) - controlled on TOPROL 50mg Bid & HYZAAR 100-12.5 daily (off Lisinopril due to ACE cough)..   CORONARY ARTERY DISEASE (ICD-414.00) - on ASA 81mg /d (intol to 325 she says) & off Plavix per Walker Kehr...  Cardiolite 9/08 showed CP & HBP response, EF+85%... had cath 10/08 w/  single vessel dis w/ mod ostial LAD stenosis and patent LAD stent, normal LVF.[name]. option for med Rx vs off pump LIMA... she notes some CP/ pressure "burning" when walking up a certain hill during her daily walks, but states this is stable and "no worse"... ~  Myoview 9/09 was neg- no scar or ischemia, EF= 89%, hypertensive response- Lisinopril10 added. ~  f/u Myoview 3/11 was neg- no scar or ischemia, EF= 86%, norm BP response, fair exerc capacity. ~  saw Walker Kehr 5/11- no CP, he stopped her Plavix, stable- continue other meds same. ~  She had recurrent CP 11/11 w/ in-stent restenosis on  cath> subseq 3 vessel CABG by DrGearhart & doing well since then... ~  6/12:  Hosp w/ CP & repeat cath showed CAD & 3/3 grafts patent, EF= 65-70%  HYPERLIPIDEMIA (ICD-272.4) - on CRESTOR 10mg  2 days per week, FISH OIL 1200mg  Tid + Flax Seed Oil... she has been intol to statins in the past>  tried low dose Trilipix but stopped this due to "thigh burning">  now followed in the [facility]--- ~  FLP 7/08 showed TChol 217, TG 236, HDL 46, LDL 150...  ~  FLP 6/09 showed TChol 233, TG 263, HDL 46, LDL 150... she agrees to try LOW DOSE Trilipix. ~  FLP 11/09 showed TChol 162, TG 167, HDL 45, LDL 83... ~  FLP 11/10 showed TChol 240, TG 271, HDL 45, LDL 150... LipClin Rx w/ FishOil & FlaxSeedOil. ~  FLP 2/11 showed TChol 285, TG 156, HDL 79, LDL 180... LC is aware & working w/ her regularly. ~  FLP 5/11 showed TChol 277, TG 241, HDL55, LDL 193...  LC added Cres10 just 2d per week. ~  FLP 10/11 showed TChol 198, TG 215, HDL 53, LDL 109... much improved on Cres10 twice weekly. ~  SEE L[facility]ID CLINIC follow up Lipid Profiles...  GERD (ICD-530.81) - on OMEPRAZOLE 40mg /d... last EGD by DrPatterson 8/07 revealed sm HH, & gastritis and dilatation done... (RUT=neg, PPI Rx)... LEVSIN 0.125mg  helps her esoph spasm... Prn Phenergan for nausea.  DIVERTICULOSIS, COLON (ICD-562.10) & IRRITABLE BOWEL SYNDROME (ICD-564.1) ~  colonoscopy  2/03 by DrPatterson showed divertics and 5 mm polyp... ~  colonoscopy 3/11 showed divertics & cecal polyp= tubular adenoma, w/ f/u planned 64yrs.  GALLSTONES>>  Diagnosed by DrPatterson and referred to [facility] DrWilson, pt asked to call prn fopr incr symptoms to consider surg...  FIBROMYALGIA (ICD-729.1) - she c/o chr fatigue, aching/ sore, on Tramadol & Skelaxin, but most benefit from low dose Hydrocodone ~1/2 tab Prn... I have recommended an increase exercise program to her... ~  5/11:  Vit D level = 50 on 50000 u weekly by Rock Nephew, NP  HEADACHE (ICD-784.0) - eval at [facility] in 2002, but she notes Rx didn't help... ~  3/11:  presented w/ muscle contraction HA's> Rx Tramadol 50mg  Q6H Prn.  DIZZINESS, CHRONIC (ICD-780.4) - MRI 2/09 per Walker Kehr showed atrophy, sm vessel dis, NAD...  Health Maintenance: ~  GI:  DrPatterson & up to date on colon screening. ~  GYN:  yearly f/u w/ Rock Nephew, NP for PAP, Mammogram at [facility], ?last BMD. ~  Immunizations:  yearly Flu vaccines in fall, Pneumovax in 2007?, TDAP given 5/11...   Past Surgical History  Procedure Date  . Coronary angioplasty with stent placement     Stent 2007  . Open heart surgery     01/19/2010  . Coronary artery bypass graft     Outpatient Encounter Prescriptions as of 01/11/2011  Medication Sig Dispense Refill  . aspirin 81 MG tablet Take 81 mg by mouth daily.        . budesonide-formoterol (SYMBICORT) 80-4.5 MCG/ACT inhaler Inhale 2 puffs into the lungs 2 (two) times daily.  1 Inhaler  12  . Coenzyme Q10 (COQ10) 100 MG CAPS Take 1 capsule by mouth daily.        . fexofenadine (ALLEGRA) 180 MG tablet Take 180 mg by mouth daily.        . Glucosamine-Chondroit-Vit C-Mn (GLUCOSAMINE CHONDR 1500 COMPLX PO) Take 1 tablet by mouth 3 (three) times daily.        [name]  losartan-hydrochlorothiazide (HYZAAR) 100-12.5 MG per tablet take 1 tablet by mouth once daily  30 tablet  12  . metoprolol (LOPRESSOR) 50 MG tablet take  1 tablet by mouth twice a day  60 tablet  10  . mometasone (NASONEX) 50 MCG/ACT nasal spray 2 sprays by Nasal route daily.        . nitroGLYCERIN (NITROSTAT) 0.4 MG SL tablet Take one tablet  By mouth SL every 5 minutes for a total of 3 doses as needed for chest pain.  25 tablet  3  . Omega-3 Fatty Acids (FISH OIL) 1200 MG CAPS 2 tabs po bid       . omeprazole (PRILOSEC) 40 MG capsule take 1 capsule by mouth once daily 30 MINUTES BEFORE THE FIRST MEAL OF THE DAY  30 capsule  6  . rosuvastatin (CRESTOR) 10 MG tablet Take 1 tablet (10 mg total) by mouth daily. 1 tab po daily( except on Tuesday and Thursday no tabs )  and on Wednesday 1/2 tab daily  30 tablet  6  . traMADol (ULTRAM) 50 MG tablet Take 1 tablet (50 mg total) by mouth every 6 (six) hours as needed.  30 tablet  1  . travoprost, benzalkonium, (TRAVATAN) 0.004 % ophthalmic solution Place 1 drop into both eyes at bedtime.       . vitamin B-12 (CYANOCOBALAMIN) 1000 MCG tablet Take 1,000 mcg by mouth daily.        . Vitamin D, Ergocalciferol, (DRISDOL) 50000 UNITS CAPS Take 50,000 Units by mouth every 30 (thirty) days. Once a month      . DISCONTD: azithromycin (ZITHROMAX Z-PAK) 250 MG tablet Take 2 tablets (500 mg) on  Day 1,  followed by 1 tablet (250 mg) once daily on Days 2 through 5.  6 each  0    Allergies  Allergen Reactions  . Clarithromycin     REACTION: rash  . Codeine     REACTION: n/v  . Lisinopril     REACTION: Developed ACE cough...  . Penicillins     REACTION: itchy rash  . Simvastatin     REACTION: pt states INTOL to STATINS \\T \ refuses to restart  . Trilipix     REACTION: pt states INTOL to Trilipix w/ "thigh burning"  . Ceclor (Cefaclor) Rash  . Doxycycline Rash  . Tobradex Rash    Current Medications, Allergies, Past Medical History, Past Surgical History, Family History, and Social History were reviewed in [employer] record.    Review of Systems Constitutional:   No  weight loss,  night sweats,  Fevers, chills,  ++fatigue, or  lassitude.  HEENT:   No headaches,  Difficulty swallowing,  Tooth/dental problems, or  Sore throat,                No sneezing, itching,  ++ nasal congestion, post nasal drip,   CV:  No chest pain,  Orthopnea, PND, swelling in lower extremities, anasarca, dizziness, palpitations, syncope.   GI  No heartburn, indigestion, abdominal pain, nausea, vomiting, diarrhea, change in bowel habits, loss of appetite, bloody stools.   Resp:   No coughing up of blood.    No chest wall deformity  Skin: no rash or lesions.  GU: no dysuria, change in color of urine, no urgency or frequency.  No flank pain, no hematuria   MS:  No joint pain or swelling.  No decreased range of motion.  No back pain.  Psych:  No change in mood or  affect. No depression or anxiety.  No memory loss.           Objective:   Physical Exam     WD, WN, 75 y/o WF in NAD... GENERAL:  Alert & oriented; pleasant & cooperative... HEENT:  [place]/AT, EOM-wnl, PERRLA, EACs-clear, TMs-wnl, NOSE-clear drainage, nontender sinus  THROAT-clear & wnl. NECK:  Supple w/ fair ROM; no JVD; normal carotid impulses w/o bruits; no thyromegaly or nodules palpated; no lymphadenopathy. CHEST:  Coarse BS  without wheezes/ rales/ or rhonchi heard... chest wall is tender & reproduces her pain... HEART:  Regular Rhythm; without murmurs/ rubs/ or gallops detected... ABDOMEN:  Soft & nontender; normal bowel sounds; no organomegaly or masses palpated  EXT: without deformities, mild arthritic changes a  no varicose veins/ venous insuffic/ or edema.       Assessment & Plan:

## 2011-01-13 ENCOUNTER — Encounter (HOSPITAL_BASED_OUTPATIENT_CLINIC_OR_DEPARTMENT_OTHER): Payer: Self-pay | Admitting: *Deleted

## 2011-01-13 ENCOUNTER — Emergency Department (INDEPENDENT_AMBULATORY_CARE_PROVIDER_SITE_OTHER): Payer: Medicare Other

## 2011-01-13 ENCOUNTER — Emergency Department (HOSPITAL_BASED_OUTPATIENT_CLINIC_OR_DEPARTMENT_OTHER)
Admission: EM | Admit: 2011-01-13 | Discharge: 2011-01-13 | Disposition: A | Discharge status: Home / Self Care | Payer: Medicare Other | Attending: Emergency Medicine | Admitting: Emergency Medicine

## 2011-01-13 DIAGNOSIS — IMO0001 Reserved for inherently not codable concepts without codable children: Secondary | ICD-10-CM | POA: Insufficient documentation

## 2011-01-13 DIAGNOSIS — Z853 Personal history of malignant neoplasm of breast: Secondary | ICD-10-CM | POA: Insufficient documentation

## 2011-01-13 DIAGNOSIS — J45909 Unspecified asthma, uncomplicated: Secondary | ICD-10-CM | POA: Insufficient documentation

## 2011-01-13 DIAGNOSIS — R05 Cough: Secondary | ICD-10-CM

## 2011-01-13 DIAGNOSIS — Z79899 Other long term (current) drug therapy: Secondary | ICD-10-CM | POA: Insufficient documentation

## 2011-01-13 DIAGNOSIS — I2510 Atherosclerotic heart disease of native coronary artery without angina pectoris: Secondary | ICD-10-CM | POA: Insufficient documentation

## 2011-01-13 DIAGNOSIS — J40 Bronchitis, not specified as acute or chronic: Secondary | ICD-10-CM

## 2011-01-13 DIAGNOSIS — K219 Gastro-esophageal reflux disease without esophagitis: Secondary | ICD-10-CM | POA: Insufficient documentation

## 2011-01-13 MED ORDER — ALBUTEROL SULFATE (5 MG/ML) 0.5% IN NEBU
5.0000 mg | INHALATION_SOLUTION | Freq: Once | RESPIRATORY_TRACT | Status: AC
  Administered 2011-01-13: 5 mg via RESPIRATORY_TRACT
  Filled 2011-01-13: qty 1

## 2011-01-13 MED ORDER — LEVOFLOXACIN 500 MG PO TABS: 500.0000 mg | ORAL_TABLET | Freq: Every day | ORAL | Status: DC

## 2011-01-13 NOTE — ED Notes (Signed)
Cough, been to dr twice this week. Dx w/ bronchitis and sinus infection. Given prednisone and z-pack

## 2011-01-13 NOTE — ED Provider Notes (Addendum)
History     CSN: [id number] Arrival date & time: 01/13/2011  4:11 AM   First MD Initiated Contact with Patient 01/13/11 [phone]      Chief Complaint  Patient presents with  . Cough   Pleasant female with persistent cough over the past week. She has always finished a Z-Pak earlier in the week.  She was also taking Mucinex and prednisone. The patient had been seen by her primary care physician earlier in the week and had a chest x-ray, this was normal. She has a persistent nonproductive cough. She's had no chest pain, no fevers. She does not use tobacco. She has had no orthopnea. She denies any leg swelling. (Consider location/radiation/quality/duration/timing/severity/associated sxs/prior treatment) HPI  Past Medical History  Diagnosis Date  . Unspecified glaucoma   . Allergic rhinitis, cause unspecified   . Unspecified asthma   . Unspecified essential hypertension   . CAD (coronary artery disease)   . Other and unspecified hyperlipidemia   . Hiatal hernia   . Esophageal reflux   . Gastritis   . Abdominal pain, left lower quadrant   . Diverticulosis of colon (without mention of hemorrhage)   . Irritable bowel syndrome   . Colonic polyp   . Fibromyalgia   . Headache   . Dizziness   . Breast cancer, Left 12/20/2010    Past Surgical History  Procedure Date  . Coronary angioplasty with stent placement     Stent 2007  . Open heart surgery     01/19/2010  . Coronary artery bypass graft     Family History  Problem Relation Age of Onset  . Heart disease Brother   . Cancer Brother   . Diabetes Sister     History  Substance Use Topics  . Smoking status: Never Smoker   . Smokeless tobacco: Not on file  . Alcohol Use: No    OB History    Grav Para Term Preterm Abortions TAB SAB Ect Mult Living                  Review of Systems  All other systems reviewed and are negative.    Allergies  Clarithromycin; Codeine; Lisinopril; Penicillins; Simvastatin; Trilipix;  Ceclor; Doxycycline; and Tobradex  Home Medications   Current Outpatient Rx  Name Route Sig Dispense Refill  . ASPIRIN 81 MG PO TABS Oral Take 81 mg by mouth daily.      . BUDESONIDE-FORMOTEROL FUMARATE 80-4.5 MCG/ACT IN AERO Inhalation Inhale 2 puffs into the lungs 2 (two) times daily. 1 Inhaler 12  . COQ10 100 MG PO CAPS Oral Take 1 capsule by mouth daily.      [name] FEXOFENADINE HCL 180 MG PO TABS Oral Take 180 mg by mouth daily.      [name] GLUCOSAMINE CHONDR 1500 COMPLX PO Oral Take 1 tablet by mouth 3 (three) times daily.      [name] HYDROCODONE-HOMATROPINE 5-1.5 MG/5ML PO SYRP Oral Take 5 mLs by mouth every 6 (six) hours as needed for cough. 120 mL 0  . LOSARTAN POTASSIUM-HCTZ 100-12.5 MG PO TABS  take 1 tablet by mouth once daily 30 tablet 12  . METOPROLOL TARTRATE 50 MG PO TABS  take 1 tablet by mouth twice a day 60 tablet 10  . MOMETASONE FUROATE 50 MCG/ACT NA SUSP Nasal 2 sprays by Nasal route daily.      [name] NITROGLYCERIN 0.4 MG SL SUBL  Take one tablet  By mouth SL every 5 minutes for a total of 3  doses as needed for chest pain. 25 tablet 3  . FISH OIL 1200 MG PO CAPS  2 tabs po bid     . OMEPRAZOLE 40 MG PO CPDR  take 1 capsule by mouth once daily 30 MINUTES BEFORE THE FIRST MEAL OF THE DAY 30 capsule 6  . PREDNISONE 10 MG PO TABS  4 tabs for 2 days, then 3 tabs for 2 days, 2 tabs for 2 days, then 1 tab for 2 days, then stop 20 tablet 0  . ROSUVASTATIN CALCIUM 10 MG PO TABS Oral Take 1 tablet (10 mg total) by mouth daily. 1 tab po daily( except on Tuesday and Thursday no tabs )  and on Wednesday 1/2 tab daily 30 tablet 6  . TRAMADOL HCL 50 MG PO TABS Oral Take 1 tablet (50 mg total) by mouth every 6 (six) hours as needed. 30 tablet 1  . TRAVOPROST 0.004 % OP SOLN Both Eyes Place 1 drop into both eyes at bedtime.     [name] VITAMIN B-12 1000 MCG PO TABS Oral Take 1,000 mcg by mouth daily.      [name] VITAMIN D (ERGOCALCIFEROL) 50000 UNITS PO CAPS Oral Take 50,000 Units by mouth every 30 (thirty) days. Once  a month      BP 147/74  Pulse 62  Temp(Src) 98 F (36.7 C) (Oral)  Resp 20  SpO2 93%  Physical Exam  Constitutional: She is oriented to person, place, and time. She appears well-developed and well-nourished. No distress.  HENT:  Head: Normocephalic and atraumatic.  Eyes: Conjunctivae and EOM are normal. Pupils are equal, round, and reactive to light.  Neck: Neck supple.  Cardiovascular: Normal rate and regular rhythm.  Exam reveals no gallop and no friction rub.   No murmur heard. Pulmonary/Chest: Breath sounds normal. She has no wheezes. She has no rales. She exhibits no tenderness.       Lungs clear with the exception of persistent, nonproductive cough during exam. There are no rhonchi. No stridor. No respiratory distress, no retractions.  Abdominal: Soft. Bowel sounds are normal. She exhibits no distension. There is no tenderness. There is no rebound and no guarding.  Musculoskeletal: Normal range of motion.  Neurological: She is alert and oriented to person, place, and time. No cranial nerve deficit. Coordination normal.  Skin: Skin is warm and dry. No rash noted.  Psychiatric: She has a normal mood and affect.    ED Course  Procedures (including critical care time)  Labs Reviewed - No data to display Dg Chest 2 View  01/11/2011  *RADIOLOGY REPORT*  Clinical Data: Cough and fever.  CHEST - 2 VIEW  Comparison: Chest x-ray 08/06/2010.  Findings: The cardiac silhouette, mediastinal and hilar contours are within normal limits and stable.  There are stable surgical changes from bypass surgery.  Coronary stents are noted.  The lungs are clear.  No pleural effusion.  The bony thorax is intact.  IMPRESSION: No acute cardiopulmonary findings.  Original Report Authenticated By: P. Loralie Champagne, M.D.     No diagnosis found.    MDM  Pt is seen and examined;  Initial history and physical completed.  Will follow.          Sherrill Buikema A. Patrica Duel, MD 01/13/11 0422   5:02  AM  Results for orders placed in visit on 12/26/10  CANCER ANTIGEN 27.29      Component Value Range   CA 27.29 16  0 - 39 (U/mL)   Dg Chest 2 View  01/11/2011  *RADIOLOGY REPORT*  Clinical Data: Cough and fever.  CHEST - 2 VIEW  Comparison: Chest x-ray 08/06/2010.  Findings: The cardiac silhouette, mediastinal and hilar contours are within normal limits and stable.  There are stable surgical changes from bypass surgery.  Coronary stents are noted.  The lungs are clear.  No pleural effusion.  The bony thorax is intact.  IMPRESSION: No acute cardiopulmonary findings.  Original Report Authenticated By: P. Loralie Champagne, M.D.   Mr Breast Bilateral W Wo Contrast  12/24/2010  *RADIOLOGY REPORT*  Clinical Data: Recent diagnosis of invasive mammary carcinoma of the left breast.  BUN and creatinine were obtained on site at [facility] at [address] Results:  BUN 12 mg/dL,  Creatinine 0.6 mg/dL.  BILATERAL BREAST MRI WITH AND WITHOUT CONTRAST  Technique: Multiplanar, multisequence MR images of both breasts were obtained prior to and following the intravenous administration of 12ml of Multihance.  Three dimensional images were evaluated at the independent DynaCad workstation.  Comparison:  Screening mammogram 12/11/2010, diagnostic left mammogram 12/13/2010, and ultrasound-guided core needle biopsy and post-biopsy mammogram 12/20/2010.  These studies were performed at [facility].  Findings: In the posterior third of the lower outer quadrant of the left breast is an irregular enhancing mass with washout kinetics and central biopsy clip artifact that measures 1.7 x 1.2 x 1.1 cm. This mass does not contact the pectoralis muscle. No additional suspicious mass or suspicious enhancement is identified in the left breast.  No mass or suspicious enhancement is identified in the right breast to suggest malignancy.  No pathologically enlarged axillary or internal mammary chain lymph nodes are  identified.  Median sternotomy wires are noted.  IMPRESSION:  1.  Solitary 1.7 cm biopsy-proven carcinoma in the lower outer quadrant of the left breast. 2.  No MRI evidence of malignancy in the right breast.  THREE-DIMENSIONAL MR IMAGE RENDERING ON INDEPENDENT WORKSTATION:  Three-dimensional MR images were rendered by post-processing of the original MR data on an independent workstation.  The three- dimensional MR images were interpreted, and findings were reported in the accompanying complete MRI report for this study.  BI-RADS CATEGORY 6:  Known biopsy-proven malignancy - appropriate action should be taken.  Original Report Authenticated By: Britta Mccreedy, M.D.      Clements Toro A. Patrica Duel, MD 01/13/11 1610

## 2011-01-14 ENCOUNTER — Encounter (HOSPITAL_COMMUNITY)
Admission: RE | Admit: 2011-01-14 | Discharge: 2011-01-14 | Disposition: A | Discharge status: Home / Self Care | Payer: Medicare Other | Source: Ambulatory Visit | Attending: Surgery | Admitting: Surgery

## 2011-01-14 ENCOUNTER — Telehealth: Payer: Self-pay | Admitting: Cardiovascular Disease

## 2011-01-14 ENCOUNTER — Encounter (HOSPITAL_COMMUNITY): Payer: Self-pay

## 2011-01-14 DIAGNOSIS — C50919 Malignant neoplasm of unspecified site of unspecified female breast: Secondary | ICD-10-CM | POA: Insufficient documentation

## 2011-01-14 HISTORY — DX: Calculus of gallbladder without cholecystitis without obstruction: K80.20

## 2011-01-14 LAB — BASIC METABOLIC PANEL
BUN: 14 mg/dL (ref 6–23)
Calcium: 10 mg/dL (ref 8.4–10.5)
Creatinine, Ser: 0.62 mg/dL (ref 0.50–1.10)
GFR calc Af Amer: >90 mL/min (ref >90)
GFR calc non Af Amer: 87 mL/min — ABNORMAL LOW (ref >90)
Potassium: 4.4 mEq/L (ref 3.5–5.1)

## 2011-01-14 LAB — CBC
HCT: 39.4 % (ref 36.0–46.0)
MCHC: 35 g/dL (ref 30.0–36.0)
MCV: 85.3 fL (ref 78.0–100.0)
RDW: 14.9 % (ref 11.5–15.5)

## 2011-01-14 LAB — SURGICAL PCR SCREEN: Staphylococcus aureus: NEGATIVE

## 2011-01-14 NOTE — Pre-Procedure Instructions (Signed)
20 [name]  01/14/2011   Your procedure is scheduled on: Mon,Nov 26 at 0800  Report to [facility] Short Stay Center at 0600 AM.  Call this number if you have problems the morning of surgery: [phone]   Remember:   Do not eat food:After Midnight.  Do not drink clear liquids: 4 Hours before arrival.May have soda,tea,black coffee,apple/grape juice,broth,or water until 2:00am  Take these medicines the morning of surgery with A SIP OF WATER: Symbicort,Allegra,Pain Pill(if needed),Levaquin,Metoprolol,Nasonex,Omeprazole,Tramadol--Bring your inhaler with you!!   Do not wear jewelry, make-up or nail polish.  Do not wear lotions, powders, or perfumes. You may wear deodorant.  Do not shave 48 hours prior to surgery.  Do not bring valuables to the hospital.  Contacts, dentures or bridgework may not be worn into surgery.  Leave suitcase in the car. After surgery it may be brought to your room.  For patients admitted to the hospital, checkout time is 11:00 AM the day of discharge.   Patients discharged the day of surgery will not be allowed to drive home.  Name and phone number of your driver:   Special Instructions: CHG Shower Use Special Wash: 1/2 bottle night before surgery and 1/2 bottle morning of surgery.   Please read over the following fact sheets that you were given: Pain Booklet, Coughing and Deep Breathing, MRSA Information and Surgical Site Infection Prevention

## 2011-01-14 NOTE — Progress Notes (Signed)
DR Ricki Miller  OFFICE CALLED FOR ECHO,CATH,STRESS TEST

## 2011-01-14 NOTE — Telephone Encounter (Signed)
Echo,Cath,Stress faxed to Kathy/[facility] @ [phone]  01/14/11/km

## 2011-01-16 ENCOUNTER — Encounter: Payer: Self-pay | Admitting: Pulmonary Disease

## 2011-01-21 ENCOUNTER — Inpatient Hospital Stay (HOSPITAL_COMMUNITY): Admission: RE | Admit: 2011-01-21 | Payer: Medicare Other | Source: Ambulatory Visit

## 2011-01-21 ENCOUNTER — Telehealth: Payer: Self-pay | Admitting: Pulmonary Disease

## 2011-01-21 MED ORDER — LEVOFLOXACIN 500 MG PO TABS: 500.0000 mg | ORAL_TABLET | Freq: Every day | ORAL | Status: AC

## 2011-01-21 MED ORDER — METHYLPREDNISOLONE 8 MG PO TABS: ORAL_TABLET | ORAL | Status: DC

## 2011-01-21 NOTE — Telephone Encounter (Signed)
I spoke with pt and she states she is still no better from when she saw TP on 01/11/11. [t c/o cough w/ green to yellow phlem, sinus congestion, green to yellow drainage. Pt states she is still taking mucinex DM, doing saline nasal rinses. Pt states she could not take the hydromet cough syrup bc it caused her to have weird dreams. Pt is requesting further recs from Dr. [name]. Please advise, thanks  Allergies  Allergen Reactions  . Clarithromycin     REACTION: rash  . Codeine     REACTION: n/v  . Lisinopril     REACTION: Developed ACE cough...  . Penicillins     REACTION: itchy rash  . Simvastatin     REACTION: pt states INTOL to STATINS \\T \ refuses to restart  . Trilipix     REACTION: pt states INTOL to Trilipix w/ "thigh burning"  . Adhesive (Tape) Rash  . Ceclor (Cefaclor) Rash  . Doxycycline Rash  . Tobradex Rash

## 2011-01-21 NOTE — Telephone Encounter (Signed)
Per SN---call in levaquin 500mg   #7  1 daily,  Medrol 8mg  tablets  #20  1 po bid x 5 days, 1 daily x 5 days then 1/2 tablet daily until gone.  thanks

## 2011-01-21 NOTE — Telephone Encounter (Signed)
PATIENT WAS INFORMED THAT MEDICATIONS WERE CALLED IN.  SHE WILL CALL PHARMACY AND CALL BACK IF THERE IS ANY PROBLEM

## 2011-01-21 NOTE — Telephone Encounter (Signed)
I spoke with pt and is aware of recs. Pt states she does not want tessalon called in. Pt is requesting another abx be called in for her. Pt states she has finished the prednisone taper. Please advise Dr. [name], thanks  Allergies  Allergen Reactions  . Clarithromycin     REACTION: rash  . Codeine     REACTION: n/v  . Lisinopril     REACTION: Developed ACE cough...  . Penicillins     REACTION: itchy rash  . Simvastatin     REACTION: pt states INTOL to STATINS \\T \ refuses to restart  . Trilipix     REACTION: pt states INTOL to Trilipix w/ "thigh burning"  . Adhesive (Tape) Rash  . Ceclor (Cefaclor) Rash  . Doxycycline Rash  . Tobradex Rash

## 2011-01-21 NOTE — Telephone Encounter (Signed)
Called and spoke with the pharmacy and gave the verbal order of medrol and abx.  Called and spoke with the pt and pt is aware that these meds have been called in to the pharmacy but will need to give the pharmacy time to fill these.  Pt voiced her understanding of this.

## 2011-01-21 NOTE — Telephone Encounter (Signed)
[email or web address].[name], CMA  

## 2011-01-21 NOTE — Telephone Encounter (Signed)
Tessalon 200mg  #30 1 po Three times a day  As needed  Cough  Cont on mucinex DM Twice daily  As needed   Fluids and rest  Finish prednisone taper.  Please contact office for sooner follow up if symptoms do not improve or worsen or seek emergency care

## 2011-01-25 ENCOUNTER — Ambulatory Visit: Payer: Medicare Other | Admitting: Oncology

## 2011-01-25 ENCOUNTER — Other Ambulatory Visit: Payer: Medicare Other | Admitting: Lab

## 2011-01-29 ENCOUNTER — Other Ambulatory Visit (INDEPENDENT_AMBULATORY_CARE_PROVIDER_SITE_OTHER): Payer: Self-pay | Admitting: Surgery

## 2011-01-29 ENCOUNTER — Encounter (HOSPITAL_COMMUNITY): Payer: Self-pay

## 2011-01-29 ENCOUNTER — Encounter (HOSPITAL_COMMUNITY)
Admission: RE | Admit: 2011-01-29 | Discharge: 2011-01-29 | Disposition: A | Discharge status: Home / Self Care | Payer: Medicare Other | Source: Ambulatory Visit | Attending: Surgery | Admitting: Surgery

## 2011-01-29 DIAGNOSIS — C50919 Malignant neoplasm of unspecified site of unspecified female breast: Secondary | ICD-10-CM

## 2011-01-29 LAB — CBC
Platelets: 264 10*3/uL (ref 150–400)
RDW: 15.7 % — ABNORMAL HIGH (ref 11.5–15.5)
WBC: 7.9 10*3/uL (ref 4.0–10.5)

## 2011-01-29 LAB — BASIC METABOLIC PANEL
Calcium: 9.3 mg/dL (ref 8.4–10.5)
Creatinine, Ser: 0.58 mg/dL (ref 0.50–1.10)
GFR calc Af Amer: >90 mL/min (ref >90)
GFR calc non Af Amer: 89 mL/min — ABNORMAL LOW (ref >90)

## 2011-01-29 NOTE — Pre-Procedure Instructions (Signed)
[name]  01/29/2011   Your procedure is scheduled on:  Wednesday February 06, 2011  Report to [facility] at [facility].  Call this number if you have problems the morning of surgery: [phone]   Remember:   Do not eat food:After Midnight.  May have clear liquids: up to 4 Hours before arrival.(nothing after 0500am)  Clear liquids include soda, tea, black coffee, apple or grape juice, broth.  Take these medicines the morning of surgery with A SIP OF WATER: symbicort, allegra, metoprolol, nasonex, omeprazole, levoflaxin, medrol, tramadol   Do not wear jewelry, make-up or nail polish.  Do not wear lotions, powders, or perfumes. You may wear deodorant.  Do not shave 48 hours prior to surgery.  Do not bring valuables to the hospital.  Contacts, dentures or bridgework may not be worn into surgery.  Leave suitcase in the car. After surgery it may be brought to your room.  For patients admitted to the hospital, checkout time is 11:00 AM the day of discharge.   Patients discharged the day of surgery will not be allowed to drive home.  Name and phone number of your driver: [name] [phone]  Special Instructions: CHG Shower Use Special Wash: 1/2 bottle night before surgery and 1/2 bottle morning of surgery.   Please read over the following fact sheets that you were given: Pain Booklet, Coughing and Deep Breathing, MRSA Information and Surgical Site Infection Prevention

## 2011-01-29 NOTE — Progress Notes (Signed)
Contacted Dr. Fabio Bering office [phone], spoke with Selena Batten in medical records, requested copy of stress test and echocardiogram.

## 2011-01-29 NOTE — Progress Notes (Signed)
Contacted Dr Tenna Child office by fax 7:35am requesting order for consent.  Contacted Jade with Dr. Weldon Inches office requested orders, stated orders were previously placed in epic but unable to retrieve.  Had Short stay manager contact Dr. Tenna Child office to assist.  Surgical consent form to be completed day of surgery.

## 2011-01-29 NOTE — Progress Notes (Signed)
Patient  CAD, s/p  CABG. Forwarded to [place] for review.

## 2011-01-30 NOTE — Consult Note (Signed)
Anesthesia:  75 year old female for booked for left breast lumpectomy.  Her history includes CAD s/p CABG, HTN, hyperlipidemia, HH, IBS, fibromyalgia, asthma, bronchitis, and glaucoma.   She had 2 prior DES to her LAD in 2007, but cath on 01/17/10 showed 1V CAD with in-stent restenosis and occlusion of her stents and EF 60%.  She ultimately underwent CABG X 3 (LIMA to LAD, SVG to DIAG, SVG to OM1) by Dr. Tyrone Sage on 01/19/10.  She had recurrent CP in June of this year and underwent repeat heart catheterization showing patent bypass grafts X 3 with normal LV systolic function. Continued medical therapy was recommended.  She was last seen by her primary Cardiologist Dr. Eden Emms on 11/28/10 and she denied any recurrent CP at that time.  Her EKG on 08/08/10 showed NSR with minor ST abnormality.  Her Pulmonlogist  is Dr. Alroy Dust.  She was last seen by their NP Tammy Parrett on 01/11/11 for URI and treated with a Prednisone taper and had already taken a Z-Pak.  It appears she had persistent symptoms and was seen in the ED on 01/13/11 and on 01/21/11 was started on a Medrol pack and a 7 day course of Levaquin, which should now be completed.  Her sats at PAT were 96%, R 20.  She was afebrile.  Her last 2V CXR on 01/11/11 showed no acute findings.  She also had a 1V CXR on 01/13/11 that showed minimal left lung base opacity felt likely reflecting scarring or atelectasis that was similar to her prior film and without acute cardiopulmonary process otherwise.  Her preoperative WBC was WNL.  Other labs were also acceptable.  Since she has been treated with two steroid tapers and two antibiotics since those CXRs were taken, I will have one repeated when she arrives on the day of surgery.  If no worrisome CXR findings and no acute cardiopulmonary symptoms, then I anticipate that she can proceed.

## 2011-01-31 ENCOUNTER — Other Ambulatory Visit: Payer: Medicare Other | Admitting: Lab

## 2011-01-31 ENCOUNTER — Ambulatory Visit: Payer: Medicare Other | Admitting: Oncology

## 2011-02-05 MED ORDER — CIPROFLOXACIN IN D5W 400 MG/200ML IV SOLN
400.0000 mg | INTRAVENOUS | Status: DC
  Filled 2011-02-05: qty 200

## 2011-02-06 ENCOUNTER — Other Ambulatory Visit (INDEPENDENT_AMBULATORY_CARE_PROVIDER_SITE_OTHER): Payer: Self-pay | Admitting: Surgery

## 2011-02-06 ENCOUNTER — Encounter (HOSPITAL_COMMUNITY): Payer: Self-pay | Admitting: *Deleted

## 2011-02-06 ENCOUNTER — Ambulatory Visit (HOSPITAL_COMMUNITY): Payer: Medicare Other | Admitting: Vascular Surgery

## 2011-02-06 ENCOUNTER — Encounter (HOSPITAL_COMMUNITY): Payer: Self-pay | Admitting: Vascular Surgery

## 2011-02-06 ENCOUNTER — Ambulatory Visit (HOSPITAL_COMMUNITY)
Admission: RE | Admit: 2011-02-06 | Discharge: 2011-02-06 | Disposition: A | Discharge status: Home / Self Care | Payer: Medicare Other | Source: Ambulatory Visit | Attending: Surgery | Admitting: Surgery

## 2011-02-06 ENCOUNTER — Ambulatory Visit (HOSPITAL_COMMUNITY): Payer: Medicare Other

## 2011-02-06 ENCOUNTER — Encounter (HOSPITAL_COMMUNITY)
Admission: RE | Disposition: A | Discharge status: Home / Self Care | Payer: Self-pay | Source: Ambulatory Visit | Attending: Surgery

## 2011-02-06 ENCOUNTER — Other Ambulatory Visit: Payer: Self-pay

## 2011-02-06 DIAGNOSIS — C50912 Malignant neoplasm of unspecified site of left female breast: Secondary | ICD-10-CM

## 2011-02-06 DIAGNOSIS — K219 Gastro-esophageal reflux disease without esophagitis: Secondary | ICD-10-CM | POA: Insufficient documentation

## 2011-02-06 DIAGNOSIS — R51 Headache: Secondary | ICD-10-CM | POA: Insufficient documentation

## 2011-02-06 DIAGNOSIS — C50919 Malignant neoplasm of unspecified site of unspecified female breast: Secondary | ICD-10-CM

## 2011-02-06 DIAGNOSIS — K449 Diaphragmatic hernia without obstruction or gangrene: Secondary | ICD-10-CM | POA: Insufficient documentation

## 2011-02-06 DIAGNOSIS — I2510 Atherosclerotic heart disease of native coronary artery without angina pectoris: Secondary | ICD-10-CM | POA: Insufficient documentation

## 2011-02-06 DIAGNOSIS — J45909 Unspecified asthma, uncomplicated: Secondary | ICD-10-CM | POA: Insufficient documentation

## 2011-02-06 DIAGNOSIS — I10 Essential (primary) hypertension: Secondary | ICD-10-CM | POA: Insufficient documentation

## 2011-02-06 DIAGNOSIS — C50519 Malignant neoplasm of lower-outer quadrant of unspecified female breast: Secondary | ICD-10-CM | POA: Insufficient documentation

## 2011-02-06 DIAGNOSIS — Z01812 Encounter for preprocedural laboratory examination: Secondary | ICD-10-CM | POA: Insufficient documentation

## 2011-02-06 HISTORY — PX: BREAST LUMPECTOMY: SHX2

## 2011-02-06 SURGERY — BREAST LUMPECTOMY WITH SENTINEL LYMPH NODE BX
Anesthesia: General | Site: Breast | Laterality: Left | Wound class: Clean

## 2011-02-06 MED ORDER — MIDAZOLAM HCL 2 MG/2ML IJ SOLN
2.0000 mg | Freq: Once | INTRAMUSCULAR | Status: AC
  Administered 2011-02-06: 2 mg via INTRAVENOUS

## 2011-02-06 MED ORDER — BUPIVACAINE HCL (PF) 0.25 % IJ SOLN
INTRAMUSCULAR | Status: DC | PRN
  Administered 2011-02-06: 30 mL

## 2011-02-06 MED ORDER — TECHNETIUM TC 99M SULFUR COLLOID FILTERED
1.0000 | Freq: Once | INTRAVENOUS | Status: AC | PRN
  Administered 2011-02-06: 1 via INTRADERMAL

## 2011-02-06 MED ORDER — FENTANYL CITRATE 0.05 MG/ML IJ SOLN
INTRAMUSCULAR | Status: DC | PRN
  Administered 2011-02-06: 50 ug via INTRAVENOUS

## 2011-02-06 MED ORDER — PROPOFOL 10 MG/ML IV EMUL
INTRAVENOUS | Status: DC | PRN
  Administered 2011-02-06: 200 mg via INTRAVENOUS

## 2011-02-06 MED ORDER — LACTATED RINGERS IV SOLN
INTRAVENOUS | Status: DC
  Administered 2011-02-06: 11:00:00 via INTRAVENOUS

## 2011-02-06 MED ORDER — GLYCOPYRROLATE 0.2 MG/ML IJ SOLN
INTRAMUSCULAR | Status: DC | PRN
  Administered 2011-02-06: 0.2 mg via INTRAVENOUS

## 2011-02-06 MED ORDER — ONDANSETRON HCL 4 MG/2ML IJ SOLN: 4.0000 mg | Freq: Once | INTRAMUSCULAR | Status: DC | PRN

## 2011-02-06 MED ORDER — LACTATED RINGERS IV SOLN
INTRAVENOUS | Status: DC | PRN
  Administered 2011-02-06: 11:00:00 via INTRAVENOUS

## 2011-02-06 MED ORDER — SODIUM CHLORIDE 0.9 % IR SOLN
Status: DC | PRN
  Administered 2011-02-06: 1

## 2011-02-06 MED ORDER — MIDAZOLAM HCL 2 MG/2ML IJ SOLN
INTRAMUSCULAR | Status: AC
  Filled 2011-02-06: qty 2

## 2011-02-06 MED ORDER — MEPERIDINE HCL 25 MG/ML IJ SOLN: 6.2500 mg | INTRAMUSCULAR | Status: DC | PRN

## 2011-02-06 MED ORDER — ONDANSETRON HCL 4 MG/2ML IJ SOLN
INTRAMUSCULAR | Status: DC | PRN
  Administered 2011-02-06: 4 mg via INTRAVENOUS

## 2011-02-06 MED ORDER — METHYLENE BLUE 1 % INJ SOLN
INTRAMUSCULAR | Status: DC | PRN
  Administered 2011-02-06: 12:00:00 via INTRADERMAL

## 2011-02-06 MED ORDER — HYDROMORPHONE HCL 2 MG PO TABS
2.0000 mg | ORAL_TABLET | Freq: Four times a day (QID) | ORAL | Status: DC | PRN
Start: 2011-02-06 — End: 2011-02-15

## 2011-02-06 MED ORDER — PHENYLEPHRINE HCL 10 MG/ML IJ SOLN
INTRAMUSCULAR | Status: DC | PRN
  Administered 2011-02-06 (×3): 80 ug via INTRAVENOUS

## 2011-02-06 MED ORDER — HYDROMORPHONE HCL PF 1 MG/ML IJ SOLN
0.2500 mg | INTRAMUSCULAR | Status: DC | PRN
  Administered 2011-02-06 (×3): 0.25 mg via INTRAVENOUS

## 2011-02-06 MED ORDER — CIPROFLOXACIN IN D5W 400 MG/200ML IV SOLN
INTRAVENOUS | Status: DC | PRN
  Administered 2011-02-06: 400 mg via INTRAVENOUS

## 2011-02-06 SURGICAL SUPPLY — 54 items
ADH SKN CLS APL DERMABOND .7 (GAUZE/BANDAGES/DRESSINGS) ×2
APPLIER CLIP 9.375 MED OPEN (MISCELLANEOUS)
APPLIER CLIP 9.375 SM OPEN (CLIP) ×2
APR CLP MED 9.3 20 MLT OPN (MISCELLANEOUS)
APR CLP SM 9.3 20 MLT OPN (CLIP) ×1
BINDER BREAST LRG (GAUZE/BANDAGES/DRESSINGS) IMPLANT
BINDER BREAST XLRG (GAUZE/BANDAGES/DRESSINGS) ×1 IMPLANT
BLADE SURG 15 STRL LF DISP TIS (BLADE) ×1 IMPLANT
BLADE SURG 15 STRL SS (BLADE) ×2
CANISTER SUCTION 2500CC (MISCELLANEOUS) ×2 IMPLANT
CHLORAPREP W/TINT 26ML (MISCELLANEOUS) ×2 IMPLANT
CLIP APPLIE 9.375 MED OPEN (MISCELLANEOUS) IMPLANT
CLIP APPLIE 9.375 SM OPEN (CLIP) IMPLANT
CLOTH BEACON ORANGE TIMEOUT ST (SAFETY) ×2 IMPLANT
CONT SPEC 4OZ CLIKSEAL STRL BL (MISCELLANEOUS) ×3 IMPLANT
COVER PROBE W GEL 5X96 (DRAPES) ×2 IMPLANT
COVER SURGICAL LIGHT HANDLE (MISCELLANEOUS) ×2 IMPLANT
DERMABOND ADVANCED (GAUZE/BANDAGES/DRESSINGS) ×2
DERMABOND ADVANCED .7 DNX12 (GAUZE/BANDAGES/DRESSINGS) ×1 IMPLANT
DEVICE DUBIN SPECIMEN MAMMOGRA (MISCELLANEOUS) ×1 IMPLANT
DRAPE LAPAROSCOPIC ABDOMINAL (DRAPES) ×2 IMPLANT
ELECT CAUTERY BLADE 6.4 (BLADE) ×2 IMPLANT
ELECT REM PT RETURN 9FT ADLT (ELECTROSURGICAL) ×2
ELECTRODE REM PT RTRN 9FT ADLT (ELECTROSURGICAL) ×1 IMPLANT
GLOVE BIOGEL PI IND STRL 7.0 (GLOVE) IMPLANT
GLOVE BIOGEL PI INDICATOR 7.0 (GLOVE) ×2
GLOVE ECLIPSE 6.5 STRL STRAW (GLOVE) ×1 IMPLANT
GLOVE EUDERMIC 7 POWDERFREE (GLOVE) ×3 IMPLANT
GLOVE SURG SS PI 6.5 STRL IVOR (GLOVE) ×2 IMPLANT
GOWN PREVENTION PLUS XLARGE (GOWN DISPOSABLE) ×2 IMPLANT
GOWN STRL NON-REIN LRG LVL3 (GOWN DISPOSABLE) ×4 IMPLANT
KIT BASIN OR (CUSTOM PROCEDURE TRAY) ×2 IMPLANT
KIT MARKER MARGIN INK (KITS) ×1 IMPLANT
KIT ROOM TURNOVER OR (KITS) ×2 IMPLANT
NDL 18GX1X1/2 (RX/OR ONLY) (NEEDLE) ×1 IMPLANT
NDL HYPO 25GX1X1/2 BEV (NEEDLE) ×2 IMPLANT
NEEDLE 18GX1X1/2 (RX/OR ONLY) (NEEDLE) ×2 IMPLANT
NEEDLE HYPO 25GX1X1/2 BEV (NEEDLE) ×4 IMPLANT
NS IRRIG 1000ML POUR BTL (IV SOLUTION) ×2 IMPLANT
PACK SURGICAL SETUP 50X90 (CUSTOM PROCEDURE TRAY) ×2 IMPLANT
PAD ARMBOARD 7.5X6 YLW CONV (MISCELLANEOUS) ×3 IMPLANT
PENCIL BUTTON HOLSTER BLD 10FT (ELECTRODE) ×2 IMPLANT
SPONGE LAP 4X18 X RAY DECT (DISPOSABLE) ×3 IMPLANT
STAPLER VISISTAT 35W (STAPLE) ×1 IMPLANT
SUT MON AB 4-0 PC3 18 (SUTURE) ×2 IMPLANT
SUT SILK 2 0 SH (SUTURE) ×1 IMPLANT
SUT VIC AB 3-0 SH 18 (SUTURE) ×2 IMPLANT
SYR BULB 3OZ (MISCELLANEOUS) ×2 IMPLANT
SYR CONTROL 10ML LL (SYRINGE) ×4 IMPLANT
TOWEL OR 17X24 6PK STRL BLUE (TOWEL DISPOSABLE) ×2 IMPLANT
TOWEL OR 17X26 10 PK STRL BLUE (TOWEL DISPOSABLE) ×2 IMPLANT
TUBE CONNECTING 12X1/4 (SUCTIONS) ×2 IMPLANT
WATER STERILE IRR 1000ML POUR (IV SOLUTION) IMPLANT
YANKAUER SUCT BULB TIP NO VENT (SUCTIONS) ×2 IMPLANT

## 2011-02-06 NOTE — Transfer of Care (Signed)
Immediate Anesthesia Transfer of Care Note  Patient: [name]  Procedure(s) Performed:  BREAST LUMPECTOMY WITH SENTINEL LYMPH NODE BX  Patient Location: PACU  Anesthesia Type: General  Level of Consciousness: awake, alert  and oriented  Airway & Oxygen Therapy: Patient Spontanous Breathing and Patient connected to nasal cannula oxygen  Post-op Assessment: Report given to PACU RN and Post -op Vital signs reviewed and stable  Post vital signs: Reviewed and stable  Complications: No apparent anesthesia complications

## 2011-02-06 NOTE — Anesthesia Postprocedure Evaluation (Signed)
  Anesthesia Post-op Note  Patient: [name]  Procedure(s) Performed:  BREAST LUMPECTOMY WITH SENTINEL LYMPH NODE BX  Patient Location: PACU  Anesthesia Type: General  Level of Consciousness: alert   Airway and Oxygen Therapy: Patient Spontanous Breathing  Post-op Pain: none  Post-op Assessment: Post-op Vital signs reviewed  Post-op Vital Signs: stable  Complications: No apparent anesthesia complications

## 2011-02-06 NOTE — H&P (Signed)
[name] is an 75 y.o. female.   Chief Complaint: Left breast cancer HPI: [name] is a 75 y.o. female who presents with a newly diagnosed left breast cancer lower outer quadrant. This was diagnosed on a routine mammogram. Apparently the technician was able to palpate the mass but the patient was unaware of its presence. She's not had a prior breast problems  The mass was biopsied and this is an invasive ductal carcinoma receptor positive but with a Ki-67 of 72%. HER-2/neu was negative. She was presentedto the breast multidisciplinary clinic for evaluation and management recommendations.Lumpectomy and SLN was recommended and this was scheduled but had to be postponed to allow the patient to recover from a bout of bronchitis. She is now over that and comes today for surgery  Past Medical History  Diagnosis Date  . Unspecified glaucoma   . Allergic rhinitis, cause unspecified   . Unspecified asthma   . Unspecified essential hypertension   . Other and unspecified hyperlipidemia   . Hiatal hernia   . Esophageal reflux   . Gastritis   . Abdominal pain, left lower quadrant   . Diverticulosis of colon (without mention of hemorrhage)   . Irritable bowel syndrome   . Colonic polyp   . Fibromyalgia   . Headache   . Dizziness   . Breast cancer, Left 12/20/2010  . Gallstones   . CAD (coronary artery disease)     Dr. Eden Emms, saw last in Oct 2012  . Bronchitis     hx of  . Pneumonia     Past Surgical History  Procedure Date  . Coronary angioplasty with stent placement     Stent 2007  . Open heart surgery     01/19/2010  . Coronary artery bypass graft   . Eye surgery     bilateral cataract removal,    Family History  Problem Relation Age of Onset  . Heart disease Brother   . Cancer Brother   . Diabetes Sister    Social History:  reports that she has never smoked. She does not have any smokeless tobacco history on file. She reports that she does not drink alcohol  or use illicit drugs.  Allergies:  Allergies  Allergen Reactions  . Clarithromycin     REACTION: rash  . Codeine     REACTION: n/v  . Hydrocodone     Nightmare after taking cough syrup w/hydrocodone  . Lisinopril     REACTION: Developed ACE cough...  . Penicillins     REACTION: itchy rash  . Simvastatin     REACTION: pt states INTOL to STATINS \\T \ refuses to restart  . Trilipix     REACTION: pt states INTOL to Trilipix w/ "thigh burning"  . Adhesive (Tape) Rash  . Ceclor (Cefaclor) Rash  . Doxycycline Rash  . Tobradex Rash    Medications Prior to Admission  Medication Dose Route Frequency Provider Last Rate Last Dose  . ciprofloxacin (CIPRO) IVPB 400 mg  400 mg Intravenous 120 min pre-op [name], MD       Medications Prior to Admission  Medication Sig Dispense Refill  . aspirin 81 MG tablet Take 81 mg by mouth daily.        . budesonide-formoterol (SYMBICORT) 80-4.5 MCG/ACT inhaler Inhale 2 puffs into the lungs daily.        . Coenzyme Q10 (COQ10) 100 MG CAPS Take 1 capsule by mouth daily.        . fexofenadine (  ALLEGRA) 180 MG tablet Take 180 mg by mouth daily.        . Glucosamine-Chondroit-Vit C-Mn (GLUCOSAMINE CHONDR 1500 COMPLX PO) Take 1 tablet by mouth 3 (three) times daily.        [name] losartan-hydrochlorothiazide (HYZAAR) 100-12.5 MG per tablet take 1 tablet by mouth once daily  30 tablet  12  . metoprolol (LOPRESSOR) 50 MG tablet take 1 tablet by mouth twice a day  60 tablet  10  . mometasone (NASONEX) 50 MCG/ACT nasal spray 2 sprays by Nasal route daily.        . Omega-3 Fatty Acids (FISH OIL) 1200 MG CAPS 2 tabs po bid       . omeprazole (PRILOSEC) 40 MG capsule take 1 capsule by mouth once daily 30 MINUTES BEFORE THE FIRST MEAL OF THE DAY  30 capsule  6  . rosuvastatin (CRESTOR) 10 MG tablet Take 1 tablet (10 mg total) by mouth daily. 1 tab po daily( except on Tuesday and Thursday no tabs )  and on Wednesday 1/2 tab daily  30 tablet  6  . travoprost,  benzalkonium, (TRAVATAN) 0.004 % ophthalmic solution Place 1 drop into both eyes at bedtime.       . vitamin B-12 (CYANOCOBALAMIN) 1000 MCG tablet Take 1,000 mcg by mouth daily.        . Vitamin D, Ergocalciferol, (DRISDOL) 50000 UNITS CAPS Take 50,000 Units by mouth every 30 (thirty) days. Once a month      . nitroGLYCERIN (NITROSTAT) 0.4 MG SL tablet Take one tablet  By mouth SL every 5 minutes for a total of 3 doses as needed for chest pain.  25 tablet  3  . traMADol (ULTRAM) 50 MG tablet Take 50 mg by mouth every 6 (six) hours as needed. For pain         No results found for this or any previous visit (from the past 48 hour(s)). Dg Chest 2 View  02/06/2011  *RADIOLOGY REPORT*  Clinical Data: Preoperative respiratory examination for left breast cancer.  Recent upper respiratory infection.  CHEST - 2 VIEW  Comparison: 01/13/2011 radiographs.  Findings: The heart size and mediastinal contours are stable status post CABG.  Coronary stents are noted.  The lungs are clear.  There is no pleural effusion.  No suspicious osseous findings are demonstrated.  Mild thoracic spine degenerative changes are stable.  IMPRESSION: Stable postoperative chest.  No acute cardiopulmonary process or evidence of metastatic disease.  Original Report Authenticated By: Gerrianne Scale, M.D.    ROS There is no change from the original ROS in November  Blood pressure 188/78, pulse 59, temperature 97.9 F (36.6 C), temperature source Oral, resp. rate 18, SpO2 96.00%. Physical Exam  GENERAL:  The patient is alert, oriented, and generally healthy-appearing, NAD. Mood and affect are normal.  HEENT:  The head is normocephalic, the eyes nonicteric, the pupils were round regular and equal. EOMs are normal. Pharynx normal. Dentition good.  NECK:  The neck is supple and there are no masses or thyromegaly.  LUNGS: Normal respirations and clear to auscultation.  HEART: Regular rhythm, with no murmurs rubs or gallops.  Pulses are intact carotid dorsalis pedis and posterior tibial. No significant varicosities are noted.  BREASTS:  The right breast is normal. There is a mass in the left lower outer quadrant as previously noted.  LYMPHATICS: There is no palpable axillary or supraclavicular adenopathy noted  ABDOMEN: Soft, flat, and nontender. No masses or organomegaly is noted. No  hernias are noted. Bowel sounds are normal.  EXTREMITIES:  Good range of motion, no edema.   Assessment/Plan Clinical stage I Left breast cancer lower outer quadrant.  Plan lumpectomy and SLN.   I have reviewed with the patient again the plans etc and all questions are answered. She is ready to proceed.  Calandra Madura J 02/06/2011, 10:57 AM

## 2011-02-06 NOTE — Op Note (Signed)
[name]  1935/05/15  [id number]  02/06/2011   Preoperative diagnosis: left breast cancer lower outer quadrant clinical stage I  Postoperative diagnosis:same   Procedure:  left lumpectomy (partial mastectomy) with blue dye injection and a sentinel lymph node biopsy (2 lymph nodes)    Surgeon: [name], MD, FACS  Anesthesia: General  Clinical History and Indications: This patient was found to have invasive carcinoma of the left breast lower outer quadrant. After medical radiation and surgical evaluations she elected to proceed to lumpectomy and sentinel lymph node evaluation. She comes to the operating room today for that procedure.  Description of Procedure:I saw the patient in the holding area and examined her and reviewed the plans for the procedure. She had no further questions. I marked the left breast as the surgical site.  The patient was taken to the operating room and after satisfactory general anesthesia had been obtained Timeout was done. I then injected 5 cc of dilute methylene blue in the subareolar tissue on the left side and massage that in.  A full prep and drape was done. Prior to starting I had used an ultrasound to confirm that the palpable abnormality was the carcinoma. I made a curvilinear incision over the mass in the lower outer quadrant and raised. Thin skin flaps in all directions but mainly superiorly and inferiorly. I then divided the breast tissue down to the chest wall above the tumor and medial to it and lateral to it. I was unable to elevate off the chest wall and divided the final inferior attachments.  The specimen was inked for margins. It was sent to to pathology.I then elevated the breast tissue from the fascia and close the deeper layers. I then closed the more superficial layers and then the skin with 4-0 Monocryl subcuticular. I put clips in the mark the margins prior to closing. Hemostasis was achieved with the cautery. I had to  elevate the skin a little bit to prevent dimpling.  Using the neoprobe a hot area was identified in the left axilla and a transverse incision made. I divided the subcutaneous tissues and entered the axillary fat pad. I was immediately identified 2 adjacent pink lymph nodes at both counts. These were both removed and one had counts of 700 the other about 200. There was no blue ink noted entering the axilla. I carefully palpated and could find no abnormal lymph nodes, saw no other blue lymph nodes and FOR around 0-5 after removal of the 2 sentinel lymph nodes.  I injected 0.25% plain Marcaine here and closed in layers with 3-0 Vicryl, 4-0 Monocryl subcuticular, and Dermabond.    The patient tolerated the procedure well. There were no operative complications. All counts were correct.   EBL: minimal  [name], MD, FACS 02/06/2011 1:07 PM

## 2011-02-06 NOTE — Preoperative (Signed)
Beta Blockers   Reason not to administer Beta Blockers:Not Applicable 

## 2011-02-06 NOTE — Anesthesia Preprocedure Evaluation (Addendum)
Anesthesia Evaluation  Patient identified by MRN, date of birth, ID band Patient awake    Reviewed: Allergy & Precautions, H&P , NPO status , Patient's Chart, lab work & pertinent test results, reviewed documented beta blocker date and time   Airway Mallampati: III TM Distance: >3 FB and <3 FB Neck ROM: full    Dental  (+) Teeth Intact and Dental Advidsory Given   Pulmonary asthma ,          Cardiovascular hypertension, On Home Beta Blockers + CAD regular     Neuro/Psych  Headaches,  Neuromuscular disease Negative Psych ROS   GI/Hepatic Neg liver ROS, hiatal hernia, GERD-  Medicated and Controlled,  Endo/Other  Negative Endocrine ROS  Renal/GU negative Renal ROS  Genitourinary negative   Musculoskeletal   Abdominal   Peds  Hematology negative hematology ROS (+)   Anesthesia Other Findings   Reproductive/Obstetrics negative OB ROS                           Anesthesia Physical Anesthesia Plan  ASA: III  Anesthesia Plan: General   Post-op Pain Management:    Induction:   Airway Management Planned: Oral ETT  Additional Equipment:   Intra-op Plan:   Post-operative Plan: Extubation in OR  Informed Consent: I have reviewed the patients History and Physical, chart, labs and discussed the procedure including the risks, benefits and alternatives for the proposed anesthesia with the patient or authorized representative who has indicated his/her understanding and acceptance.   Dental Advisory Given  Plan Discussed with: Anesthesiologist, CRNA and Surgeon  Anesthesia Plan Comments:        Anesthesia Quick Evaluation

## 2011-02-07 ENCOUNTER — Telehealth (INDEPENDENT_AMBULATORY_CARE_PROVIDER_SITE_OTHER): Payer: Self-pay | Admitting: General Surgery

## 2011-02-07 NOTE — Telephone Encounter (Signed)
Called patient, made aware of pathology results. She will follow up at her scheduled visit and call with any problems.

## 2011-02-07 NOTE — Telephone Encounter (Signed)
Message copied by Liliana Cline on Thu Feb 07, 2011  2:05 PM ------      Message from: [name]      Created: Thu Feb 07, 2011  1:43 PM       Tell the patient that her margins are OK and her lymph nodes are negative. I will discuss in detail in the office.

## 2011-02-08 ENCOUNTER — Encounter: Payer: Self-pay | Admitting: *Deleted

## 2011-02-08 NOTE — Progress Notes (Signed)
Ordered Oncotype Dx request w/ [facility].  Faxed request to Pathology.

## 2011-02-15 ENCOUNTER — Encounter (INDEPENDENT_AMBULATORY_CARE_PROVIDER_SITE_OTHER): Payer: Self-pay | Admitting: Surgery

## 2011-02-15 ENCOUNTER — Ambulatory Visit (INDEPENDENT_AMBULATORY_CARE_PROVIDER_SITE_OTHER): Payer: Medicare Other | Admitting: Surgery

## 2011-02-15 VITALS — Temp 97.2°F

## 2011-02-15 DIAGNOSIS — Z09 Encounter for follow-up examination after completed treatment for conditions other than malignant neoplasm: Secondary | ICD-10-CM

## 2011-02-15 MED ORDER — CIPROFLOXACIN HCL 500 MG PO TABS: 500.0000 mg | ORAL_TABLET | Freq: Two times a day (BID) | ORAL | Status: AC

## 2011-02-15 NOTE — Patient Instructions (Signed)
Keep a dressing over the breast incision and if you are having more drainage we will need to see you Monday, otherwise we will see you Thursday as scheduled

## 2011-02-15 NOTE — Progress Notes (Signed)
Chief complaint: Drainage from incision  History of present illness: The patient is about 10 days from a left lumpectomy (lower outer quadrant) and sentinel lymph node for breast cancer. She has developed spontaneous serous drainage from the breast incision. It was not opened. She's not had any significant pain. She's had no systemic symptoms such as fevers or chills. She has not noticed any particular redness to the incisions. She called today and we had her come in for a check. \ Temp(Src) 97.2 F (36.2 C) (Temporal)  Exam: Gen.: The patient is alert oriented and healthy appearing and in no distress.  Breasts: The left breast shows some ecchymosis but minimal around the lumpectomy site. There does appear to be a little bit of drainage between the incision but there does appear to be any cellulitis or infection. The incision is a little bit fluctuant feeling as if there is some fluid present. The extra incision is clean and healing there also appears to be some fluid clear.  Impression: Seromas at both the breast and axillary sites  Plan: I aspirated both sites. There was about 15 cc of serous material in the axilla and about 45 in the breast. She noticed improvement of the fullness and she was feeling.  I will start her on Cipro 500 b.i.d. because of concern this could become infected since she has had spontaneous drainage. She is scheduled to see me next Thursday but we will have her check with [place] on Monday if she has ongoing drainage over the weekend or any other problems.

## 2011-02-21 ENCOUNTER — Ambulatory Visit (INDEPENDENT_AMBULATORY_CARE_PROVIDER_SITE_OTHER): Payer: Medicare Other | Admitting: Surgery

## 2011-02-21 ENCOUNTER — Encounter (INDEPENDENT_AMBULATORY_CARE_PROVIDER_SITE_OTHER): Payer: Self-pay | Admitting: Surgery

## 2011-02-21 VITALS — BP 142/76 | HR 68 | Temp 97.9°F | Resp 16 | Ht 59.0 in | Wt 130.8 lb

## 2011-02-21 DIAGNOSIS — C50519 Malignant neoplasm of lower-outer quadrant of unspecified female breast: Secondary | ICD-10-CM

## 2011-02-21 NOTE — Progress Notes (Signed)
Chief complaint: Drainage from incision  History of present illness: I aspirated fluid on her last visit and she is back for a recheck. She has quit draining from the breast area, butnotes fullness again in the axillar \ BP 142/76  Pulse 68  Temp(Src) 97.9 F (36.6 C) (Temporal)  Resp 16  Ht 4\' 11"  (1.499 m)  Wt 130 lb 12.8 oz (59.33 kg)  BMI 26.42 kg/m2  Exam: Gen.: The patient is alert oriented and healthy appearing and in no distress.  Breasts: The left breast shows some ecchymosis but minimal around the lumpectomy site. There remains some breast erythema but less. No drainage.Fluctuant area in the axilla has reformed Impression: Seromas at both the breast and axillary sites  Plan: I aspirated both sites. There was about 25cc of serous material in the axilla and about 15 in the breast. She noticed improvement of the fullness and she was feeling.  Continue Cipro and see again in a week

## 2011-02-21 NOTE — Patient Instructions (Signed)
See me again next week 

## 2011-02-25 ENCOUNTER — Encounter: Payer: Self-pay | Admitting: *Deleted

## 2011-02-25 NOTE — Progress Notes (Signed)
Received Oncotype Dx results of 21.  Gave copy to MD.

## 2011-02-27 DIAGNOSIS — M5030 Other cervical disc degeneration, unspecified cervical region: Secondary | ICD-10-CM | POA: Diagnosis not present

## 2011-02-27 DIAGNOSIS — M545 Low back pain: Secondary | ICD-10-CM | POA: Diagnosis not present

## 2011-02-27 DIAGNOSIS — M9981 Other biomechanical lesions of cervical region: Secondary | ICD-10-CM | POA: Diagnosis not present

## 2011-02-28 ENCOUNTER — Ambulatory Visit (HOSPITAL_BASED_OUTPATIENT_CLINIC_OR_DEPARTMENT_OTHER): Payer: Medicare Other | Admitting: Oncology

## 2011-02-28 VITALS — BP 170/78 | HR 69 | Temp 98.0°F | Ht 59.0 in | Wt 133.0 lb

## 2011-02-28 DIAGNOSIS — C50519 Malignant neoplasm of lower-outer quadrant of unspecified female breast: Secondary | ICD-10-CM

## 2011-02-28 DIAGNOSIS — Z170 Estrogen receptor positive status [ER+]: Secondary | ICD-10-CM | POA: Diagnosis not present

## 2011-02-28 DIAGNOSIS — C50919 Malignant neoplasm of unspecified site of unspecified female breast: Secondary | ICD-10-CM

## 2011-02-28 NOTE — Progress Notes (Signed)
ID: LISA-MARIE RUEGER  DOB: 02-Apr-1935  MR#: [id number]  CSN#: [id number]   [name] is a 76 year old [place] woman who on routine screening mammography December 11, 2010, at [facility] was found to have a possible abnormality in the left breast.  Additional imaging on October 18th showed an irregular, lobulated mass in the lower outer quadrant of the left breast which by ultrasound was irregular and hypoechoic.  It measured 2.5 cm by ultrasound.  The left axilla showed a benign-looking lymph node which did not appear to have changed compared to prior mammograms.   With this information, the patient underwent biopsy of the left breast mass October 25th, and this showed [phone]) an invasive mammary carcinoma with some lobular features but strongly E-cadherin positive, and so an invasive ductal carcinoma.  There was evidence of perineural invasion, although no definite evidence of angiolymphatic invasion.  The tumor had a CISH ratio of HER-2 to CEP17 signals of 1.15, indicating no amplification.  Estrogen receptor was positive at 100%, progesterone receptor was positive at 61%, and the proliferation marker was 72%.   With this information, the patient was referred to Dr. [name], and bilateral breast MRIs were obtained December 24, 2010.  This showed the mass in the left breast to measure 1.7 cm, to be solitary, to be not contacting the pectoralis, and in addition, there was no suspicious finding in the right breast, no pathologically enlarged axillary or internal mammary chain lymph nodes. The patient proceeded to Left lumpectomy with sentinel lymph node biopsy 02/06/11 as detailed below.  Interval History:   She did well with her surgery, without dehiscence, unusual pain, or bleeding. She does tell me she has had fluid removed by Dr. [name] twice and that she developed some redness in the breast which he is treating with antibiotics. A detailed review of systems was otherwise stable, with a little bit  of a dry cough, urinary leakage, headaches, and back pain, none of which are more intense or frequent than previously.  PAST MEDICAL HISTORY:  Coronary artery disease status post open heart surgery under Ed Gerhardt November 2011, with bypasses from the left internal mammary artery to the left anterior descending, and a reverse saphenous vein graft to the diagonal coronary artery.  The patient had previously had stents placed by Dr. Eden Emms in 2007.  Other problems include a history of reflux esophagitis, hypertension, hyperlipidemia, rosacea and gallstones.  FAMILY HISTORY:  The patient's father died at the age of 67 following a stroke.  The patient's mother died at 18 with a history of Alzheimer disease.  The patient had 2 sisters, one of whom developed breast cancer at the age of 56 and survives.  The patient has a brother who apparently had lymphoma.  He is also a survivor.  GYNECOLOGIC HISTORY:  Menarche age 51.  Menopause in the mid 1990s.  She never used hormone replacement.  She is a GX, P2.  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  Her husband, [name]  has worked as a Curator and is still very active in his shop.   Daughter, [name], 50, lives in [place] and is a home school mom.  Daughter, [name], 40, lives in [place] and works as a Geophysicist/field seismologist for the [employer].  The patient attends the [employer] in [place].  HEALTH MAINTENANCE:  This patient has never smoked, does not abuse alcohol.  She had a colonoscopy  2011 under Dr. Jarold Motto.  She had a bone density 2012, which she says was "normal."  This was done at [facility].  She had a Pap smear last year.  I do not have a copy of her most recent cholesterol.   Allergies  Allergen Reactions  . Simvastatin Other (See Comments)    REACTION: pt states INTOL to STATINS \\T \ refuses to restart  . Trilipix Other (See Comments)    REACTION: pt states INTOL to  Trilipix w/ "thigh burning"  . Hydrocodone     Nightmare after taking cough syrup w/hydrocodone  . Adhesive (Tape) Rash  . Ceclor (Cefaclor) Rash  . Clarithromycin Rash  . Codeine Nausea Only  . Doxycycline Rash  . Lisinopril Cough    Developed ACE cough...  . Penicillins Itching and Rash    At injection site  . Tobradex Rash    Current Outpatient Prescriptions  Medication Sig Dispense Refill  . aspirin 81 MG tablet Take 81 mg by mouth daily.        . budesonide-formoterol (SYMBICORT) 80-4.5 MCG/ACT inhaler Inhale 2 puffs into the lungs daily.        . ciprofloxacin (CIPRO) 500 MG tablet Take 500 mg by mouth 2 (two) times daily.        . Coenzyme Q10 (COQ10) 100 MG CAPS Take 1 capsule by mouth daily.        . fexofenadine (ALLEGRA) 180 MG tablet Take 180 mg by mouth daily.        . Glucosamine-Chondroit-Vit C-Mn (GLUCOSAMINE CHONDR 1500 COMPLX PO) Take 1 tablet by mouth 3 (three) times daily.        [name] losartan-hydrochlorothiazide (HYZAAR) 100-12.5 MG per tablet take 1 tablet by mouth once daily  30 tablet  12  . metoprolol (LOPRESSOR) 50 MG tablet take 1 tablet by mouth twice a day  60 tablet  10  . mometasone (NASONEX) 50 MCG/ACT nasal spray 2 sprays by Nasal route daily.        . nitroGLYCERIN (NITROSTAT) 0.4 MG SL tablet Take one tablet  By mouth SL every 5 minutes for a total of 3 doses as needed for chest pain.  25 tablet  3  . Omega-3 Fatty Acids (FISH OIL) 1200 MG CAPS 2 tabs po bid       . omeprazole (PRILOSEC) 40 MG capsule take 1 capsule by mouth once daily 30 MINUTES BEFORE THE FIRST MEAL OF THE DAY  30 capsule  6  . rosuvastatin (CRESTOR) 10 MG tablet Take 1 tablet (10 mg total) by mouth daily. 1 tab po daily( except on Tuesday and Thursday no tabs )  and on Wednesday 1/2 tab daily  30 tablet  6  . traMADol (ULTRAM) 50 MG tablet Take 50 mg by mouth as needed. For pain      . travoprost, benzalkonium, (TRAVATAN) 0.004 % ophthalmic solution Place 1 drop into both eyes at  bedtime.       . vitamin B-12 (CYANOCOBALAMIN) 1000 MCG tablet Take 1,000 mcg by mouth daily.        . Vitamin D, Ergocalciferol, (DRISDOL) 50000 UNITS CAPS Take 50,000 Units by mouth every 30 (thirty) days. Once a month         Objective:  Filed Vitals:   02/28/11 1258  BP: 170/78  Pulse: 69  Temp: 98 F (36.7 C)    BMI: Body mass index is 26.86 kg/(m^2).   ECOG FS:  Physical Exam:   Sclerae unicteric  Oropharynx clear  No peripheral adenopathy  Lungs clear -- no rales or rhonchi  Heart regular rate and rhythm  Abdomen benign  MSK no focal spinal tenderness, no peripheral edema; well healed sternotomy scar  Neuro nonfocal  Breast exam: Right breast unremarkable. Left breast status post lumpectomy and sentinel lymph node sampling. There is no dehiscence. There is some pink erythema over the breast, which blanches easily. There is no deep erythema or visible or palpable margin. Overall the appearance of the left breast is consistent with recent surgery.  Lab Results:      Chemistry      Component Value Date/Time   NA 130* 01/29/2011 1419   K 4.6 01/29/2011 1419   CL 93* 01/29/2011 1419   CO2 29 01/29/2011 1419   BUN 13 01/29/2011 1419   CREATININE 0.58 01/29/2011 1419      Component Value Date/Time   CALCIUM 9.3 01/29/2011 1419   ALKPHOS 84 12/26/2010 1215   AST 22 12/26/2010 1215   ALT 22 12/26/2010 1215   BILITOT 0.9 12/26/2010 1215       Lab Results  Component Value Date   WBC 7.9 01/29/2011   HGB 12.9 01/29/2011   HCT 38.3 01/29/2011   MCV 85.5 01/29/2011   PLT 264 01/29/2011   NEUTROABS 2.5 12/26/2010    Studies/Results:  Dg Chest 2 View  02/06/2011  *RADIOLOGY REPORT*  Clinical Data: Preoperative respiratory examination for left breast cancer.  Recent upper respiratory infection.  CHEST - 2 VIEW  Comparison: 01/13/2011 radiographs.  Findings: The heart size and mediastinal contours are stable status post CABG.  Coronary stents are noted.  The lungs are clear.   There is no pleural effusion.  No suspicious osseous findings are demonstrated.  Mild thoracic spine degenerative changes are stable.  IMPRESSION: Stable postoperative chest.  No acute cardiopulmonary process or evidence of metastatic disease.  Original Report Authenticated By: Gerrianne Scale, M.D.   REPORT OF SURGICAL PATHOLOGY 06 Feb 2011 FINAL DIAGNOSIS Diagnosis 1. Breast, lumpectomy, Left; Lower outer quadrant - INVASIVE DUCTAL CARCINOMA, 2.3 CM IN DIMENSION. - NEGATIVE FOR LYMPHOVASCULAR INVASION. - MARGINS ARE NEGATIVE FOR CARCINOMA. - SEE COMMENT. 2. Lymph node, sentinel, biopsy, Left axillary - ONE LYMPH NODE, NEGATIVE FOR CARCINOMA (0/1). 3. Lymph node, sentinel, biopsy, Left - ONE LYMPH NODE, NEGATIVE FOR CARCINOMA (0/1).  Assessment: 76 year old [place] woman s/p Left lumpectomy and sentinel lymph node sampling 02/06/11 for a pT2 pN0 or Stage II  invasive ductal carcinoma, grade III, strongly estrogen and progesterone receptor positive, HER-2 negative, with an Mib-1 of 72%. Oncotype DX shows a recurrence score of 21 predicting a 13% risk of distant recurrence if she takes tamoxifen for five years.   Plan: We spent the better part of the visit today discussing her prognosis, and she understands that the Oncotype results place her tumor in the intermediate category. This means we are following back on the Adjuvant! Database to predict chemotherapy benefit, and that database suggests a risk of mortality of 21% within 10 years in the absence of any adjuvant therapy, dropping to 16% with 5 years of aromatase inhibitor treatment. The predicted benefit of chemotherapy in this setting would be in the 1% range. Accordingly I felt comfortable forgoing chemotherapy in this case, and this was agreeable to the patient and her family, present today. This recommendation is consistent with NCCN guidelines.  Accordingly we are proceeding to radiation under Dr. Roselind Messier. Note that margins were  negative but close. The patient's daughter was  very concerned regarding genetic testing, and we have placed a request for genetic counseling to clarify this issue for them. I also scheduled the patient for a visit to the lymphedema clinic for general instruction on lymphedema prevention. The patient will see me again in mid March. At that time we will likely initiate letrozole and start every 3 month followup alternating with her surgeon.  Kaynan Klonowski C 02/28/2011

## 2011-03-01 DIAGNOSIS — J309 Allergic rhinitis, unspecified: Secondary | ICD-10-CM | POA: Diagnosis not present

## 2011-03-04 ENCOUNTER — Ambulatory Visit
Admission: RE | Admit: 2011-03-04 | Discharge: 2011-03-04 | Disposition: A | Discharge status: Home / Self Care | Payer: Medicare Other | Source: Ambulatory Visit | Attending: Radiation Oncology | Admitting: Radiation Oncology

## 2011-03-04 VITALS — BP 157/93 | HR 69 | Temp 97.9°F | Ht 59.0 in | Wt 134.4 lb

## 2011-03-04 DIAGNOSIS — C50519 Malignant neoplasm of lower-outer quadrant of unspecified female breast: Secondary | ICD-10-CM | POA: Diagnosis not present

## 2011-03-04 DIAGNOSIS — Z510 Encounter for antineoplastic radiation therapy: Secondary | ICD-10-CM | POA: Insufficient documentation

## 2011-03-04 DIAGNOSIS — Z170 Estrogen receptor positive status [ER+]: Secondary | ICD-10-CM | POA: Insufficient documentation

## 2011-03-04 DIAGNOSIS — L989 Disorder of the skin and subcutaneous tissue, unspecified: Secondary | ICD-10-CM | POA: Diagnosis not present

## 2011-03-04 DIAGNOSIS — C50919 Malignant neoplasm of unspecified site of unspecified female breast: Secondary | ICD-10-CM | POA: Diagnosis not present

## 2011-03-04 DIAGNOSIS — X58XXXA Exposure to other specified factors, initial encounter: Secondary | ICD-10-CM | POA: Insufficient documentation

## 2011-03-04 DIAGNOSIS — Z79899 Other long term (current) drug therapy: Secondary | ICD-10-CM | POA: Insufficient documentation

## 2011-03-04 DIAGNOSIS — T7840XA Allergy, unspecified, initial encounter: Secondary | ICD-10-CM | POA: Insufficient documentation

## 2011-03-04 DIAGNOSIS — T8140XA Infection following a procedure, unspecified, initial encounter: Secondary | ICD-10-CM | POA: Insufficient documentation

## 2011-03-04 DIAGNOSIS — Z7982 Long term (current) use of aspirin: Secondary | ICD-10-CM | POA: Insufficient documentation

## 2011-03-04 DIAGNOSIS — Y838 Other surgical procedures as the cause of abnormal reaction of the patient, or of later complication, without mention of misadventure at the time of the procedure: Secondary | ICD-10-CM | POA: Insufficient documentation

## 2011-03-04 DIAGNOSIS — Y842 Radiological procedure and radiotherapy as the cause of abnormal reaction of the patient, or of later complication, without mention of misadventure at the time of the procedure: Secondary | ICD-10-CM | POA: Insufficient documentation

## 2011-03-04 NOTE — Progress Notes (Signed)
Please see the Nurse Progress Note in the MD Initial Consult Encounter for this patient. 

## 2011-03-04 NOTE — Progress Notes (Signed)
Pt. here for consideration and set up of radiation treatment. Will not need chemotherapy. No questions for me except "when do we get started?"

## 2011-03-04 NOTE — Progress Notes (Signed)
CC:   [name], M.D. [name], M.D. [name]. [name] Basque, MD  REFERRING PHYSICIAN:  Lowella [name], M.D.  DIAGNOSIS:  Invasive ductal carcinoma of the left breast (pT2 pN0).  HISTORY OF PRESENT ILLNESS:  [name] is a very pleasant, 76 year old female who is seen out of the courtesy of Dr. Darnelle Catalan for further evaluation as it relates to [name] left breast cancer.  [name] was seen initially by myself in the multidisciplinary breast clinic on 12/26/2010.  At that time, the patient elected to proceed with breast conserving therapy.  On 02/06/2011, the patient was taken to the operating room by Dr. [name] at which time the patient underwent partial mastectomy and sentinel node procedure.  Upon pathologic review, the patient was found to have an invasive ductal carcinoma measuring 2.3 cm in dimension.  The tumor was grade 3.  The margins were clear but close with the closest margin being posterior at 0.1 cm.  The patient had 2 benign lymph nodes removed from the left axilla.  This tumor was estrogen receptor-positive at 100% and progesterone receptor-positive at 61%.  The patient did have an Oncotype DX test which showed average rate of distant recurrence of 13%.  The patient did see Dr. Darnelle Catalan again who recommended consideration for hormonal therapy.  The patient is now seen in Radiation Oncology for consideration for breast conserving therapy.  REVIEW OF SYSTEMS:  The patient has some mild discomfort in the left breast.  She has had some problems with postoperative infection and has been on antibiotics concerning this issue.  The patient is scheduled to see Dr. [name] again tomorrow concerning this issue.  The patient denies any new bony pain or headaches.  PHYSICAL EXAMINATION:  Vital Signs:  Temperature is 97.9.  Pulse 69. Blood pressure is elevated at 157/93 and I have recommended the patient followup with [name] primary care physician concerning this  issue. Examination of the Neck and Supraclavicular Region:  No evidence of adenopathy.  The axillary areas are free of adenopathy.  Examination of the Lungs:  Clear.  Heart:  Regular rhythm and rate.  Breasts: Examination of the right breast reveals no palpable mass or nipple discharge.  Examination left breast reveals a scar in the lower outer quadrant, which is healing well without obvious drainage.  The left breast is somewhat swollen and mildly erythematous.  There is no dominant mass appreciated in the breast.  The patient has a separate scar in the low axilla from [name] sentinel node procedure, which is also healing well.  IMPRESSION AND PLAN:  Stage II invasive mammary carcinoma of the left breast.  I feel [name] would be an excellent candidate for breast conservation with radiation therapy directed at the left breast area. Given the size of the patient's tumor as well as a closed posterior margin, I would not recommend partial mastectomy alone in this situation.  I discussed the overall treatment course, side effects, and potential toxicities of radiation therapy in this situation with [name] and [name].  The patient appears to understand and wishes to proceed with the planned course of treatment.  The patient will return next week for CT simulation with treatments to begin approximately 2 weeks from now.  This should allow adequate time for healing from the patient's recent surgery and for clearance of [name] infection.    ______________________________ Billie Lade, Ph.D., M.D. JDK/MEDQ  D:  03/04/2011  T:  03/04/2011  Job:  [id number]

## 2011-03-05 ENCOUNTER — Ambulatory Visit (INDEPENDENT_AMBULATORY_CARE_PROVIDER_SITE_OTHER): Payer: Medicare Other | Admitting: Surgery

## 2011-03-05 ENCOUNTER — Encounter (INDEPENDENT_AMBULATORY_CARE_PROVIDER_SITE_OTHER): Payer: Self-pay | Admitting: Surgery

## 2011-03-05 VITALS — BP 138/78 | HR 70 | Temp 96.9°F | Resp 18 | Ht 59.0 in | Wt 132.8 lb

## 2011-03-05 DIAGNOSIS — Z09 Encounter for follow-up examination after completed treatment for conditions other than malignant neoplasm: Secondary | ICD-10-CM

## 2011-03-05 DIAGNOSIS — J309 Allergic rhinitis, unspecified: Secondary | ICD-10-CM | POA: Diagnosis not present

## 2011-03-05 NOTE — Patient Instructions (Signed)
l me if the redness increases or you develop fever. I will see you in about three weeks. Go to the ABC class when you can

## 2011-03-05 NOTE — Progress Notes (Signed)
Chief complaint: F/U post op seroma  History of present illness: I aspirated fluid on her last visit and she is back for a recheck. She notes no recurrent fluid in the axilla, but has some in the breast. The lumpectomy site remains red, not hot, and she has had no fever \ BP 138/78  Pulse 70  Temp(Src) 96.9 F (36.1 C) (Temporal)  Resp 18  Ht 4\' 11"  (1.499 m)  Wt 132 lb 12.8 oz (60.238 kg)  BMI 26.82 kg/m2  Exam: Gen.: The patient is alert oriented and healthy appearing and in no distress.  BreastsThere remains some breast erythema but less. No drainage.Fluctuant area in the axilla has resolved Impression: Seromas at both the breast and axillary sites  Plan: I aspirated 10 cc from the breast  See me in three weeks but call if the redness increases or she develops fever. She will start radiation in about two weeks

## 2011-03-05 NOTE — Progress Notes (Signed)
Encounter addended by: Tessa Lerner, RN on: 03/05/2011 11:42 AM<BR>     Documentation filed: Charges VN

## 2011-03-06 DIAGNOSIS — J309 Allergic rhinitis, unspecified: Secondary | ICD-10-CM | POA: Diagnosis not present

## 2011-03-11 ENCOUNTER — Ambulatory Visit
Admission: RE | Admit: 2011-03-11 | Discharge: 2011-03-11 | Disposition: A | Discharge status: Home / Self Care | Payer: Medicare Other | Source: Ambulatory Visit | Attending: Radiation Oncology | Admitting: Radiation Oncology

## 2011-03-11 DIAGNOSIS — Z79899 Other long term (current) drug therapy: Secondary | ICD-10-CM | POA: Diagnosis not present

## 2011-03-11 DIAGNOSIS — C50919 Malignant neoplasm of unspecified site of unspecified female breast: Secondary | ICD-10-CM | POA: Diagnosis not present

## 2011-03-11 DIAGNOSIS — C50519 Malignant neoplasm of lower-outer quadrant of unspecified female breast: Secondary | ICD-10-CM

## 2011-03-11 DIAGNOSIS — Z510 Encounter for antineoplastic radiation therapy: Secondary | ICD-10-CM | POA: Diagnosis not present

## 2011-03-11 DIAGNOSIS — T8140XA Infection following a procedure, unspecified, initial encounter: Secondary | ICD-10-CM | POA: Diagnosis not present

## 2011-03-11 DIAGNOSIS — Z170 Estrogen receptor positive status [ER+]: Secondary | ICD-10-CM | POA: Diagnosis not present

## 2011-03-11 DIAGNOSIS — L989 Disorder of the skin and subcutaneous tissue, unspecified: Secondary | ICD-10-CM | POA: Diagnosis not present

## 2011-03-11 NOTE — Progress Notes (Signed)
DIAGNOSIS:  Left breast cancer.  NARRATIVE:  Earlier today [name] underwent treatment planning to begin radiation therapy directed at the left breast area.  The patient was placed on the CT simulator table.  A customized AccuForm mold was placed under the head and neck area for accurate positioning for treatment.  The patient then had a wire placed along the lumpectomy scar in the lower outer quadrant of the left breast.  The patient then proceeded to undergo a CT scan in the treatment position.  Under virtual simulation, the lumpectomy cavity and lumpectomy scar as well as associated clips were noted.  In addition, the heart, lungs and nipple area were outlined.  The patient then had setup of 2 custom shaped tangential beams encompassing the left breast.  Forward planning will be used to improve the dose homogeneity.  In addition, wedging will be used to improve the dose homogeneity.  A computerized isodose plan will be generated for treatment.  TREATMENT PLAN:  The patient is to proceed with daily radiation treatments at 180 cGy per day for 28 treatments for a cumulative dose to the left breast of 5040 cGy.  The patient will then undergo additional planning and continue with a boost field to a cumulative dose of 6440 cGy in light of the close posterior margin.  The patient will likely be treated with a combination of 6 and 18 MV photons.    ______________________________ [name], Ph.D., M.D. JDK/MEDQ  D:  03/11/2011  T:  03/11/2011  Job:  [id number]

## 2011-03-11 NOTE — Progress Notes (Signed)
Met with patient to discuss RO billing.  Rad Tx: [id number] Extrl Beam Attending Rad: Dr. Roselind Messier Dx: 174.5 Lower-outer quadrant of breast

## 2011-03-12 DIAGNOSIS — C50519 Malignant neoplasm of lower-outer quadrant of unspecified female breast: Secondary | ICD-10-CM | POA: Diagnosis not present

## 2011-03-12 DIAGNOSIS — L989 Disorder of the skin and subcutaneous tissue, unspecified: Secondary | ICD-10-CM | POA: Diagnosis not present

## 2011-03-12 DIAGNOSIS — Z170 Estrogen receptor positive status [ER+]: Secondary | ICD-10-CM | POA: Diagnosis not present

## 2011-03-12 DIAGNOSIS — M9981 Other biomechanical lesions of cervical region: Secondary | ICD-10-CM | POA: Diagnosis not present

## 2011-03-12 DIAGNOSIS — T8140XA Infection following a procedure, unspecified, initial encounter: Secondary | ICD-10-CM | POA: Diagnosis not present

## 2011-03-12 DIAGNOSIS — Z79899 Other long term (current) drug therapy: Secondary | ICD-10-CM | POA: Diagnosis not present

## 2011-03-12 DIAGNOSIS — M5030 Other cervical disc degeneration, unspecified cervical region: Secondary | ICD-10-CM | POA: Diagnosis not present

## 2011-03-12 DIAGNOSIS — C50919 Malignant neoplasm of unspecified site of unspecified female breast: Secondary | ICD-10-CM | POA: Diagnosis not present

## 2011-03-12 DIAGNOSIS — M545 Low back pain: Secondary | ICD-10-CM | POA: Diagnosis not present

## 2011-03-12 DIAGNOSIS — Z510 Encounter for antineoplastic radiation therapy: Secondary | ICD-10-CM | POA: Diagnosis not present

## 2011-03-13 NOTE — Progress Notes (Signed)
Encounter addended by: Tessa Lerner, RN on: 03/13/2011  5:06 PM<BR>     Documentation filed: Inpatient Patient Education

## 2011-03-15 ENCOUNTER — Other Ambulatory Visit: Payer: Medicare Other | Admitting: Lab

## 2011-03-18 ENCOUNTER — Other Ambulatory Visit: Payer: Medicare Other | Admitting: Lab

## 2011-03-18 ENCOUNTER — Other Ambulatory Visit: Payer: Self-pay | Admitting: Oncology

## 2011-03-18 ENCOUNTER — Ambulatory Visit
Admission: RE | Admit: 2011-03-18 | Discharge: 2011-03-18 | Disposition: A | Discharge status: Home / Self Care | Payer: Medicare Other | Source: Ambulatory Visit | Attending: Radiation Oncology | Admitting: Radiation Oncology

## 2011-03-18 ENCOUNTER — Ambulatory Visit: Payer: Medicare Other

## 2011-03-18 ENCOUNTER — Encounter: Payer: Self-pay | Admitting: Oncology

## 2011-03-18 DIAGNOSIS — C50519 Malignant neoplasm of lower-outer quadrant of unspecified female breast: Secondary | ICD-10-CM | POA: Diagnosis not present

## 2011-03-18 DIAGNOSIS — T8140XA Infection following a procedure, unspecified, initial encounter: Secondary | ICD-10-CM | POA: Diagnosis not present

## 2011-03-18 DIAGNOSIS — C50919 Malignant neoplasm of unspecified site of unspecified female breast: Secondary | ICD-10-CM | POA: Diagnosis not present

## 2011-03-18 DIAGNOSIS — Z8049 Family history of malignant neoplasm of other genital organs: Secondary | ICD-10-CM | POA: Diagnosis not present

## 2011-03-18 DIAGNOSIS — L989 Disorder of the skin and subcutaneous tissue, unspecified: Secondary | ICD-10-CM | POA: Diagnosis not present

## 2011-03-18 DIAGNOSIS — Z510 Encounter for antineoplastic radiation therapy: Secondary | ICD-10-CM | POA: Diagnosis not present

## 2011-03-18 DIAGNOSIS — Z170 Estrogen receptor positive status [ER+]: Secondary | ICD-10-CM | POA: Diagnosis not present

## 2011-03-18 DIAGNOSIS — Z79899 Other long term (current) drug therapy: Secondary | ICD-10-CM | POA: Diagnosis not present

## 2011-03-18 DIAGNOSIS — Z803 Family history of malignant neoplasm of breast: Secondary | ICD-10-CM | POA: Diagnosis not present

## 2011-03-18 NOTE — Progress Notes (Signed)
[facility] Radiation Oncology Simulation Verification Note   Name: [name] MRN: [id number]   Date: @T @  DOB: Nov 28, 1935  Status:outpatient   DIAGNOSIS:  1. Breast cancer, Left     POSITION: Patient was placed in the supine position on the treatment machine.  Isocenter and MLCS were reviewed and treatment was approved.  NARRATIVE: Patient tolerated simulation well.

## 2011-03-19 ENCOUNTER — Ambulatory Visit
Admission: RE | Admit: 2011-03-19 | Discharge: 2011-03-19 | Disposition: A | Discharge status: Home / Self Care | Payer: Medicare Other | Source: Ambulatory Visit | Attending: Radiation Oncology | Admitting: Radiation Oncology

## 2011-03-19 DIAGNOSIS — C50919 Malignant neoplasm of unspecified site of unspecified female breast: Secondary | ICD-10-CM | POA: Diagnosis not present

## 2011-03-19 DIAGNOSIS — Z510 Encounter for antineoplastic radiation therapy: Secondary | ICD-10-CM | POA: Diagnosis not present

## 2011-03-19 DIAGNOSIS — L989 Disorder of the skin and subcutaneous tissue, unspecified: Secondary | ICD-10-CM | POA: Diagnosis not present

## 2011-03-19 DIAGNOSIS — J309 Allergic rhinitis, unspecified: Secondary | ICD-10-CM | POA: Diagnosis not present

## 2011-03-19 DIAGNOSIS — C50519 Malignant neoplasm of lower-outer quadrant of unspecified female breast: Secondary | ICD-10-CM | POA: Diagnosis not present

## 2011-03-19 DIAGNOSIS — Z170 Estrogen receptor positive status [ER+]: Secondary | ICD-10-CM | POA: Diagnosis not present

## 2011-03-19 DIAGNOSIS — T8140XA Infection following a procedure, unspecified, initial encounter: Secondary | ICD-10-CM | POA: Diagnosis not present

## 2011-03-19 DIAGNOSIS — Z79899 Other long term (current) drug therapy: Secondary | ICD-10-CM | POA: Diagnosis not present

## 2011-03-19 MED ORDER — ALRA NON-METALLIC DEODORANT (RAD-ONC)
1.0000 "application " | Freq: Once | TOPICAL | Status: AC
  Administered 2011-03-19: 1 via TOPICAL

## 2011-03-19 MED ORDER — RADIAPLEXRX EX GEL
Freq: Once | CUTANEOUS | Status: AC
  Administered 2011-03-19: 11:00:00 via TOPICAL

## 2011-03-19 NOTE — Progress Notes (Signed)
Here for weekly radiation visit for treatment to left breast .Today is first treatment.Post sim education performed.pt. Given radiaplex and alra deodorant and Radiation Therapy and You booklet. Side effect reviewed. Informed not to wear underwire bra. Left nipple inverted since surgery.

## 2011-03-19 NOTE — Progress Notes (Signed)
Post sim education performed.

## 2011-03-19 NOTE — Progress Notes (Signed)
[facility]    Radiation Oncology [address] [place]     Maryln Gottron, M.D. [place], [place] [address]               Billie Lade, M.D., Ph.D. Phone: [phone]      Molli Hazard A. Kathrynn Running, M.D. Fax: [phone]      Radene Gunning, M.D., Ph.D.         Lurline Hare, M.D.         Grayland Jack, M.D Weekly Treatment Management Note  Name: [name]     MRN: [id number]        CSN: [id number] Date: 03/19/2011      DOB: 26-Jun-1935  CC: Michele Mcalpine, MD, MD         [name]    Status: Outpatient  Diagnosis: The encounter diagnosis was Breast cancer, Left.  Current Dose: 180  Current Fraction: 1  Planned Dose: 6440 cGy  Narrative: [name] was seen today for weekly treatment management. The chart was checked and port films  were reviewed. Pt. Tolerating well without side effects.  Simvastatin; Trilipix; Hydrocodone; Adhesive; Ceclor; Clarithromycin; Codeine; Doxycycline; Lisinopril; Penicillins; and Tobradex Current Outpatient Prescriptions  Medication Sig Dispense Refill  . aspirin 81 MG tablet Take 81 mg by mouth daily.        . budesonide-formoterol (SYMBICORT) 80-4.5 MCG/ACT inhaler Inhale 2 puffs into the lungs daily.        . ciprofloxacin (CIPRO) 500 MG tablet Take 500 mg by mouth 2 (two) times daily.        . Coenzyme Q10 (COQ10) 100 MG CAPS Take 1 capsule by mouth daily.        . fexofenadine (ALLEGRA) 180 MG tablet Take 180 mg by mouth daily.        . Glucosamine-Chondroit-Vit C-Mn (GLUCOSAMINE CHONDR 1500 COMPLX PO) Take 1 tablet by mouth 3 (three) times daily.        [name] losartan-hydrochlorothiazide (HYZAAR) 100-12.5 MG per tablet take 1 tablet by mouth once daily  30 tablet  12  . metoprolol (LOPRESSOR) 50 MG tablet take 1 tablet by mouth twice a day  60 tablet  10  . mometasone (NASONEX) 50 MCG/ACT nasal spray 2 sprays by Nasal route daily.        . nitroGLYCERIN (NITROSTAT) 0.4 MG SL tablet Take one tablet  By mouth SL every 5  minutes for a total of 3 doses as needed for chest pain.  25 tablet  3  . Omega-3 Fatty Acids (FISH OIL) 1200 MG CAPS 2 tabs po bid       . omeprazole (PRILOSEC) 40 MG capsule take 1 capsule by mouth once daily 30 MINUTES BEFORE THE FIRST MEAL OF THE DAY  30 capsule  6  . rosuvastatin (CRESTOR) 10 MG tablet Take 1 tablet (10 mg total) by mouth daily. 1 tab po daily( except on Tuesday and Thursday no tabs )  and on Wednesday 1/2 tab daily  30 tablet  6  . traMADol (ULTRAM) 50 MG tablet Take 50 mg by mouth as needed. For pain      . travoprost, benzalkonium, (TRAVATAN) 0.004 % ophthalmic solution Place 1 drop into both eyes at bedtime.       . vitamin B-12 (CYANOCOBALAMIN) 1000 MCG tablet Take 1,000 mcg by mouth daily.        . Vitamin D, Ergocalciferol, (DRISDOL) 50000 UNITS CAPS Take 50,000 Units by mouth every 30 (thirty) days. Once a  month       Current Facility-Administered Medications  Medication Dose Route Frequency Provider Last Rate Last Dose  . hyaluronate sodium (RADIAPLEXRX) gel   Topical Once Billie Lade, MD      . non-metallic deodorant Thornton Papas) 1 application  1 application Topical Once Billie Lade, MD   1 application at 03/19/11 1042   Labs:  Lab Results  Component Value Date   WBC 7.9 01/29/2011   HGB 12.9 01/29/2011   HCT 38.3 01/29/2011   MCV 85.5 01/29/2011   PLT 264 01/29/2011   Lab Results  Component Value Date   CREATININE 0.58 01/29/2011   BUN 13 01/29/2011   NA 130* 01/29/2011   K 4.6 01/29/2011   CL 93* 01/29/2011   CO2 29 01/29/2011   Lab Results  Component Value Date   ALT 22 12/26/2010   AST 22 12/26/2010   BILITOT 0.9 12/26/2010    Physical Examination:  vitals were not taken for this visit.   Wt Readings from Last 3 Encounters:  03/05/11 132 lb 12.8 oz (60.238 kg)  03/04/11 134 lb 6.4 oz (60.963 kg)  02/28/11 133 lb (60.328 kg)     Lungs - Normal respiratory effort, chest expands symmetrically. Lungs are clear to auscultation, no crackles or  wheezes.  Heart has regular rhythm and rate  Abdomen is soft and non tender with normal bowel sounds Left breast shows mild edema and subsequent mild erythema.  No obvious signs of infection.  Assessment:  Patient tolerating treatments well  Plan: Continue treatment per original radiation prescription

## 2011-03-20 ENCOUNTER — Ambulatory Visit
Admission: RE | Admit: 2011-03-20 | Discharge: 2011-03-20 | Disposition: A | Discharge status: Home / Self Care | Payer: Medicare Other | Source: Ambulatory Visit | Attending: Radiation Oncology | Admitting: Radiation Oncology

## 2011-03-20 DIAGNOSIS — Z170 Estrogen receptor positive status [ER+]: Secondary | ICD-10-CM | POA: Diagnosis not present

## 2011-03-20 DIAGNOSIS — T8140XA Infection following a procedure, unspecified, initial encounter: Secondary | ICD-10-CM | POA: Diagnosis not present

## 2011-03-20 DIAGNOSIS — Z79899 Other long term (current) drug therapy: Secondary | ICD-10-CM | POA: Diagnosis not present

## 2011-03-20 DIAGNOSIS — L989 Disorder of the skin and subcutaneous tissue, unspecified: Secondary | ICD-10-CM | POA: Diagnosis not present

## 2011-03-20 DIAGNOSIS — Z510 Encounter for antineoplastic radiation therapy: Secondary | ICD-10-CM | POA: Diagnosis not present

## 2011-03-20 DIAGNOSIS — C50919 Malignant neoplasm of unspecified site of unspecified female breast: Secondary | ICD-10-CM | POA: Diagnosis not present

## 2011-03-21 ENCOUNTER — Ambulatory Visit
Admission: RE | Admit: 2011-03-21 | Discharge: 2011-03-21 | Disposition: A | Discharge status: Home / Self Care | Payer: Medicare Other | Source: Ambulatory Visit | Attending: Radiation Oncology | Admitting: Radiation Oncology

## 2011-03-21 DIAGNOSIS — L989 Disorder of the skin and subcutaneous tissue, unspecified: Secondary | ICD-10-CM | POA: Diagnosis not present

## 2011-03-21 DIAGNOSIS — T8140XA Infection following a procedure, unspecified, initial encounter: Secondary | ICD-10-CM | POA: Diagnosis not present

## 2011-03-21 DIAGNOSIS — Z79899 Other long term (current) drug therapy: Secondary | ICD-10-CM | POA: Diagnosis not present

## 2011-03-21 DIAGNOSIS — Z510 Encounter for antineoplastic radiation therapy: Secondary | ICD-10-CM | POA: Diagnosis not present

## 2011-03-21 DIAGNOSIS — C50919 Malignant neoplasm of unspecified site of unspecified female breast: Secondary | ICD-10-CM | POA: Diagnosis not present

## 2011-03-21 DIAGNOSIS — Z170 Estrogen receptor positive status [ER+]: Secondary | ICD-10-CM | POA: Diagnosis not present

## 2011-03-22 ENCOUNTER — Ambulatory Visit
Admission: RE | Admit: 2011-03-22 | Discharge: 2011-03-22 | Disposition: A | Discharge status: Home / Self Care | Payer: Medicare Other | Source: Ambulatory Visit | Attending: Radiation Oncology | Admitting: Radiation Oncology

## 2011-03-22 DIAGNOSIS — L989 Disorder of the skin and subcutaneous tissue, unspecified: Secondary | ICD-10-CM | POA: Diagnosis not present

## 2011-03-22 DIAGNOSIS — T8140XA Infection following a procedure, unspecified, initial encounter: Secondary | ICD-10-CM | POA: Diagnosis not present

## 2011-03-22 DIAGNOSIS — Z79899 Other long term (current) drug therapy: Secondary | ICD-10-CM | POA: Diagnosis not present

## 2011-03-22 DIAGNOSIS — Z170 Estrogen receptor positive status [ER+]: Secondary | ICD-10-CM | POA: Diagnosis not present

## 2011-03-22 DIAGNOSIS — C50919 Malignant neoplasm of unspecified site of unspecified female breast: Secondary | ICD-10-CM | POA: Diagnosis not present

## 2011-03-22 DIAGNOSIS — Z510 Encounter for antineoplastic radiation therapy: Secondary | ICD-10-CM | POA: Diagnosis not present

## 2011-03-25 ENCOUNTER — Ambulatory Visit
Admission: RE | Admit: 2011-03-25 | Discharge: 2011-03-25 | Disposition: A | Discharge status: Home / Self Care | Payer: Medicare Other | Source: Ambulatory Visit | Attending: Radiation Oncology | Admitting: Radiation Oncology

## 2011-03-25 DIAGNOSIS — Z510 Encounter for antineoplastic radiation therapy: Secondary | ICD-10-CM | POA: Diagnosis not present

## 2011-03-25 DIAGNOSIS — Z79899 Other long term (current) drug therapy: Secondary | ICD-10-CM | POA: Diagnosis not present

## 2011-03-25 DIAGNOSIS — Z170 Estrogen receptor positive status [ER+]: Secondary | ICD-10-CM | POA: Diagnosis not present

## 2011-03-25 DIAGNOSIS — T8140XA Infection following a procedure, unspecified, initial encounter: Secondary | ICD-10-CM | POA: Diagnosis not present

## 2011-03-25 DIAGNOSIS — C50919 Malignant neoplasm of unspecified site of unspecified female breast: Secondary | ICD-10-CM | POA: Diagnosis not present

## 2011-03-25 DIAGNOSIS — L989 Disorder of the skin and subcutaneous tissue, unspecified: Secondary | ICD-10-CM | POA: Diagnosis not present

## 2011-03-26 ENCOUNTER — Ambulatory Visit
Admission: RE | Admit: 2011-03-26 | Discharge: 2011-03-26 | Disposition: A | Discharge status: Home / Self Care | Payer: Medicare Other | Source: Ambulatory Visit | Attending: Radiation Oncology | Admitting: Radiation Oncology

## 2011-03-26 ENCOUNTER — Ambulatory Visit (INDEPENDENT_AMBULATORY_CARE_PROVIDER_SITE_OTHER): Payer: Medicare Other | Admitting: Surgery

## 2011-03-26 ENCOUNTER — Encounter (INDEPENDENT_AMBULATORY_CARE_PROVIDER_SITE_OTHER): Payer: Self-pay | Admitting: Surgery

## 2011-03-26 ENCOUNTER — Encounter: Payer: Self-pay | Admitting: Radiation Oncology

## 2011-03-26 VITALS — BP 160/80 | HR 76 | Temp 98.1°F | Resp 18 | Ht 59.0 in | Wt 133.0 lb

## 2011-03-26 DIAGNOSIS — T8140XA Infection following a procedure, unspecified, initial encounter: Secondary | ICD-10-CM | POA: Diagnosis not present

## 2011-03-26 DIAGNOSIS — C50519 Malignant neoplasm of lower-outer quadrant of unspecified female breast: Secondary | ICD-10-CM

## 2011-03-26 DIAGNOSIS — Z170 Estrogen receptor positive status [ER+]: Secondary | ICD-10-CM | POA: Diagnosis not present

## 2011-03-26 DIAGNOSIS — M5030 Other cervical disc degeneration, unspecified cervical region: Secondary | ICD-10-CM | POA: Diagnosis not present

## 2011-03-26 DIAGNOSIS — L989 Disorder of the skin and subcutaneous tissue, unspecified: Secondary | ICD-10-CM | POA: Diagnosis not present

## 2011-03-26 DIAGNOSIS — M9981 Other biomechanical lesions of cervical region: Secondary | ICD-10-CM | POA: Diagnosis not present

## 2011-03-26 DIAGNOSIS — M5412 Radiculopathy, cervical region: Secondary | ICD-10-CM | POA: Diagnosis not present

## 2011-03-26 DIAGNOSIS — Z79899 Other long term (current) drug therapy: Secondary | ICD-10-CM | POA: Diagnosis not present

## 2011-03-26 DIAGNOSIS — C50919 Malignant neoplasm of unspecified site of unspecified female breast: Secondary | ICD-10-CM | POA: Diagnosis not present

## 2011-03-26 DIAGNOSIS — Z510 Encounter for antineoplastic radiation therapy: Secondary | ICD-10-CM | POA: Diagnosis not present

## 2011-03-26 DIAGNOSIS — Z09 Encounter for follow-up examination after completed treatment for conditions other than malignant neoplasm: Secondary | ICD-10-CM

## 2011-03-26 NOTE — Progress Notes (Signed)
Here for weekly radiation visti with md. Has completed 6 of 28 treatments to left breast. Has some mild redness. No pain. Has had a few occasions of facial flushing which confirmed not radiation reaction.

## 2011-03-26 NOTE — Patient Instructions (Signed)
See me again in three or four weeks, after you finished radiation.

## 2011-03-26 NOTE — Progress Notes (Signed)
DIAGNOSIS:  Left breast cancer.  NARRATIVE:  [name] is seen today for weekly assessment.  She has completed 6/35 planned treatments directed at the left breast area (1,080 cGy of a planned 6,440 cGy).  The patient has noticed some slight pruritus within the left breast area but otherwise is tolerating her treatments well.  The patient did see Dr. Jamey [name] earlier today.  There was no further aspirations planned.  The patient was felt to have resolution of her seromas.  The patient denies any chills or fever.  PHYSICAL EXAMINATION:  Vital Signs:  The patient's weight is stable at 133.  Temperature is 98.1.  Pulse 76.  Blood pressure is elevated at 160/80 and I have recommended that the patient followup with her primary care physician concerning this issue.  Neck:  Examination of the neck and supraclavicular region reveals no evidence of adenopathy.  Lungs: Clear.  Heart:  Regular rhythm and rate.  Axillae:  There is no axillary adenopathy appreciated.  Breasts:  Examination of the left breast reveals some erythema, consistent with edema.  There are no signs of infection in the breast or significant radiation reaction at this point.  IMPRESSION AND PLAN:  The patient is tolerating her radiation treatments well at this time.  The patient's radiation fields are setting up accurately.  The patient's radiation chart was checked today.  The plan is to continue with breast conservation therapy to a cumulative dose of 6,440 cGy.    ______________________________ [name], Ph.D., M.D. JDK/MEDQ  D:  03/26/2011  T:  03/26/2011  Job:  [id number]

## 2011-03-26 NOTE — Progress Notes (Signed)
Chief complaint: Post op check  History of present illness: I aspirated fluid on her last visit and she is back for a recheck. She notes no recurrent fluid in the axilla  , BP 160/80  Pulse 76  Temp(Src) 98.1 F (36.7 C) (Temporal)  Resp 18  Ht 4\' 11"  (1.499 m)  Wt 133 lb (60.328 kg)  BMI 26.86 kg/m2  Exam: Gen.: The patient is alert oriented and healthy appearing and in no distress.  BreastsThere remains some breast erythema but less. No apparent fluid collectioins.  Impression:Resolved seromas  Plan:See me when she finishes radiation

## 2011-03-27 ENCOUNTER — Ambulatory Visit
Admission: RE | Admit: 2011-03-27 | Discharge: 2011-03-27 | Disposition: A | Discharge status: Home / Self Care | Payer: Medicare Other | Source: Ambulatory Visit | Attending: Radiation Oncology | Admitting: Radiation Oncology

## 2011-03-27 DIAGNOSIS — C50919 Malignant neoplasm of unspecified site of unspecified female breast: Secondary | ICD-10-CM | POA: Diagnosis not present

## 2011-03-27 DIAGNOSIS — Z79899 Other long term (current) drug therapy: Secondary | ICD-10-CM | POA: Diagnosis not present

## 2011-03-27 DIAGNOSIS — J309 Allergic rhinitis, unspecified: Secondary | ICD-10-CM | POA: Diagnosis not present

## 2011-03-27 DIAGNOSIS — L989 Disorder of the skin and subcutaneous tissue, unspecified: Secondary | ICD-10-CM | POA: Diagnosis not present

## 2011-03-27 DIAGNOSIS — T8140XA Infection following a procedure, unspecified, initial encounter: Secondary | ICD-10-CM | POA: Diagnosis not present

## 2011-03-27 DIAGNOSIS — Z510 Encounter for antineoplastic radiation therapy: Secondary | ICD-10-CM | POA: Diagnosis not present

## 2011-03-27 DIAGNOSIS — Z170 Estrogen receptor positive status [ER+]: Secondary | ICD-10-CM | POA: Diagnosis not present

## 2011-03-28 ENCOUNTER — Ambulatory Visit
Admission: RE | Admit: 2011-03-28 | Discharge: 2011-03-28 | Disposition: A | Discharge status: Home / Self Care | Payer: Medicare Other | Source: Ambulatory Visit | Attending: Radiation Oncology | Admitting: Radiation Oncology

## 2011-03-28 DIAGNOSIS — Z79899 Other long term (current) drug therapy: Secondary | ICD-10-CM | POA: Diagnosis not present

## 2011-03-28 DIAGNOSIS — Z170 Estrogen receptor positive status [ER+]: Secondary | ICD-10-CM | POA: Diagnosis not present

## 2011-03-28 DIAGNOSIS — Z510 Encounter for antineoplastic radiation therapy: Secondary | ICD-10-CM | POA: Diagnosis not present

## 2011-03-28 DIAGNOSIS — T8140XA Infection following a procedure, unspecified, initial encounter: Secondary | ICD-10-CM | POA: Diagnosis not present

## 2011-03-28 DIAGNOSIS — L989 Disorder of the skin and subcutaneous tissue, unspecified: Secondary | ICD-10-CM | POA: Diagnosis not present

## 2011-03-28 DIAGNOSIS — C50919 Malignant neoplasm of unspecified site of unspecified female breast: Secondary | ICD-10-CM | POA: Diagnosis not present

## 2011-03-29 ENCOUNTER — Telehealth: Payer: Self-pay | Admitting: Genetic Counselor

## 2011-03-29 ENCOUNTER — Ambulatory Visit
Admission: RE | Admit: 2011-03-29 | Discharge: 2011-03-29 | Disposition: A | Discharge status: Home / Self Care | Payer: Medicare Other | Source: Ambulatory Visit | Attending: Radiation Oncology | Admitting: Radiation Oncology

## 2011-03-29 DIAGNOSIS — L989 Disorder of the skin and subcutaneous tissue, unspecified: Secondary | ICD-10-CM | POA: Diagnosis not present

## 2011-03-29 DIAGNOSIS — Z79899 Other long term (current) drug therapy: Secondary | ICD-10-CM | POA: Diagnosis not present

## 2011-03-29 DIAGNOSIS — T8140XA Infection following a procedure, unspecified, initial encounter: Secondary | ICD-10-CM | POA: Diagnosis not present

## 2011-03-29 DIAGNOSIS — C50919 Malignant neoplasm of unspecified site of unspecified female breast: Secondary | ICD-10-CM | POA: Diagnosis not present

## 2011-03-29 DIAGNOSIS — Z170 Estrogen receptor positive status [ER+]: Secondary | ICD-10-CM | POA: Diagnosis not present

## 2011-03-29 DIAGNOSIS — Z510 Encounter for antineoplastic radiation therapy: Secondary | ICD-10-CM | POA: Diagnosis not present

## 2011-04-01 ENCOUNTER — Ambulatory Visit
Admission: RE | Admit: 2011-04-01 | Discharge: 2011-04-01 | Disposition: A | Discharge status: Home / Self Care | Payer: Medicare Other | Source: Ambulatory Visit | Attending: Radiation Oncology | Admitting: Radiation Oncology

## 2011-04-01 DIAGNOSIS — C50919 Malignant neoplasm of unspecified site of unspecified female breast: Secondary | ICD-10-CM | POA: Diagnosis not present

## 2011-04-01 DIAGNOSIS — Z79899 Other long term (current) drug therapy: Secondary | ICD-10-CM | POA: Diagnosis not present

## 2011-04-01 DIAGNOSIS — Z510 Encounter for antineoplastic radiation therapy: Secondary | ICD-10-CM | POA: Diagnosis not present

## 2011-04-01 DIAGNOSIS — T8140XA Infection following a procedure, unspecified, initial encounter: Secondary | ICD-10-CM | POA: Diagnosis not present

## 2011-04-01 DIAGNOSIS — L989 Disorder of the skin and subcutaneous tissue, unspecified: Secondary | ICD-10-CM | POA: Diagnosis not present

## 2011-04-01 DIAGNOSIS — Z170 Estrogen receptor positive status [ER+]: Secondary | ICD-10-CM | POA: Diagnosis not present

## 2011-04-02 ENCOUNTER — Ambulatory Visit (INDEPENDENT_AMBULATORY_CARE_PROVIDER_SITE_OTHER): Payer: Medicare Other | Admitting: Pulmonary Disease

## 2011-04-02 ENCOUNTER — Ambulatory Visit
Admission: RE | Admit: 2011-04-02 | Discharge: 2011-04-02 | Disposition: A | Discharge status: Home / Self Care | Payer: Medicare Other | Source: Ambulatory Visit | Attending: Radiation Oncology | Admitting: Radiation Oncology

## 2011-04-02 ENCOUNTER — Encounter: Payer: Self-pay | Admitting: Pulmonary Disease

## 2011-04-02 ENCOUNTER — Encounter: Payer: Self-pay | Admitting: Radiation Oncology

## 2011-04-02 DIAGNOSIS — L989 Disorder of the skin and subcutaneous tissue, unspecified: Secondary | ICD-10-CM | POA: Diagnosis not present

## 2011-04-02 DIAGNOSIS — I2510 Atherosclerotic heart disease of native coronary artery without angina pectoris: Secondary | ICD-10-CM

## 2011-04-02 DIAGNOSIS — R42 Dizziness and giddiness: Secondary | ICD-10-CM

## 2011-04-02 DIAGNOSIS — I10 Essential (primary) hypertension: Secondary | ICD-10-CM | POA: Diagnosis not present

## 2011-04-02 DIAGNOSIS — T8140XA Infection following a procedure, unspecified, initial encounter: Secondary | ICD-10-CM | POA: Diagnosis not present

## 2011-04-02 DIAGNOSIS — C50519 Malignant neoplasm of lower-outer quadrant of unspecified female breast: Secondary | ICD-10-CM

## 2011-04-02 DIAGNOSIS — Z79899 Other long term (current) drug therapy: Secondary | ICD-10-CM | POA: Diagnosis not present

## 2011-04-02 DIAGNOSIS — C50919 Malignant neoplasm of unspecified site of unspecified female breast: Secondary | ICD-10-CM | POA: Diagnosis not present

## 2011-04-02 DIAGNOSIS — K219 Gastro-esophageal reflux disease without esophagitis: Secondary | ICD-10-CM

## 2011-04-02 DIAGNOSIS — J45909 Unspecified asthma, uncomplicated: Secondary | ICD-10-CM

## 2011-04-02 DIAGNOSIS — R51 Headache: Secondary | ICD-10-CM

## 2011-04-02 DIAGNOSIS — Z510 Encounter for antineoplastic radiation therapy: Secondary | ICD-10-CM | POA: Diagnosis not present

## 2011-04-02 DIAGNOSIS — Z170 Estrogen receptor positive status [ER+]: Secondary | ICD-10-CM | POA: Diagnosis not present

## 2011-04-02 DIAGNOSIS — E785 Hyperlipidemia, unspecified: Secondary | ICD-10-CM

## 2011-04-02 DIAGNOSIS — K5730 Diverticulosis of large intestine without perforation or abscess without bleeding: Secondary | ICD-10-CM

## 2011-04-02 DIAGNOSIS — IMO0001 Reserved for inherently not codable concepts without codable children: Secondary | ICD-10-CM

## 2011-04-02 NOTE — Patient Instructions (Signed)
Today we updated your med list in our EPIC system...    Continue your current medications the same...  For your cough:    Continue w/ the Symbicort 80- 2 sp twice daily...    We will try OTC meds first> MUCINEX DM 1-2 tabs twice daily w/ fluids; and DELSYM 2tsp twice daily...  Call for any questions or if we can be of service in any way...  Let's plan a follow up visit in 6 months time.Marland KitchenMarland Kitchen

## 2011-04-02 NOTE — Progress Notes (Signed)
Subjective:    Patient ID: [name], female    DOB: [birth year], 76 y.o.   MRN: [id number]  HPI 76 y/o WF here for a follow up visit... she has multiple medical problems including AR & Asthma;  HBP;  CAD followed by Walker Kehr & s/p 3 vessel CABG 11/11 by DrGearhardt;  Hyperlipidemia followed in the [facility];  GERD/ IBS/ Divertics;  Breast Cancer diagnosed 12/12;  Fibromyalgia;  Chr HAs & dizziness...  ~  May 04, 2009:  presents w/ HA- posterior pain> band-like c/w muscle contraction HA... she under some stress dealing w/ father's estate... she went to the ER w/ CP & elevated BP> she ruled out for MI & was started on HYZAAR 100-12.5 by DrNishan in f/u... she has a repeat Myoview pending next week (it was neg)... she continues her Lipid Management per [facility] on FishOil, FlaxSeedOil, & diet...  she had f/u colonoscopy 3/11 by DrPatterson> divertics,1 polyp= tubular adenoma, f/u 92yrs...   ~  Jul 25, 2009:  she's noted some incr SOB, wheezing over the last 2 weeks w/ the heat... we discussed using the Symbicort, Proair, etc... she saw Walker Kehr 5/11- doing well, stopped her Plavix, no angina & non-ischemic Myoview 3/11... BP controlled on meds;  continues on FishOil & FlaxSeesOil for her Chol (intol to all other meds) followed by the clinic but FLP remains deranged w/ TChol 277 & LDL 193;  we will give her a TDAP today...  ~  May 08, 2010:  [name] has had a busy interval hx> epis of CP, cath w/ in-stent restenosis, then CABG x3 11/11 by DrGearhardt... now c/o refractory URI x 6wks w/ sinusitis/ bronchitis/ laryngitis & rx w/ ZPak, Biaxin, etc from DrESL "I'm allergic" she says... finally received Avelox/ Medrol & improved...     AR/ Asthma>  DrESL stopped her Symbicort in favor of QVar (she didn't like it & returned to [place])...    HBP>  controlled on Toprol & Hyzaar w/ BP 130/70 today... denies visual changes, CP, palipit, dizziness, syncope, dyspnea, edema, etc...     CAD>  s/p CABG as noted  & saw both DrNishan & DrGearhardt 12/11 (notes reviewed)...    Hyperlipid>  followed in the [facility] & FLP 2/12 reviewed on Cres10 2d per week...    Others>  she has persistant FM symptoms and chr HAs, uses Vicodin Prn...  ~  Sep 26, 2010:  74mo ROV & she was re-hospitalized 6/12 by [facility] for CP> cath showed severe 3 vessel CAD w/ 3/3 grafts patent & norm LVF w/ EF= 65-70%; no change in meds; she states "they don't know what it was"; she saw [place] 7/12 in office f/u & he noted Rx limited by side effects, continued same regimen...  Her CC remains various aches & pains, notes breathing is good w/o cough, sputum, SOB, etc;  Notes GI w/u DrPatterson prev w/ Gallstones...  ~  April 02, 2011:  35mo ROV & [name] has had another event filled interval> this time w/ mammogram showing a left breast nodule & surg (partial mastectomy & sentinel node bx) by [name] 12/12 that proved to be an invasive ductal carcinoma, 2.3cm size, w/ neg lymphovasc invasion, clear margins (but close at 1mm), & neg nodes;  subseq XRT by DrKindard (had her 11th treatment today); and Oncology eval by DrMagrinat who plans hormone Rx later (no chemo she says)... She is tolerating treatment well & spirits are up, [name] had to aspirate a seroma & it is now resolved.Marland KitchenMarland Kitchen  Pat had her usual autumn URI, AB exac in Nov RX w/ ZPak, Pred, Mucinex, Hydromet but reacted to the hydrocodone & prefers OTC non-narcotic cough suppression & we discussed MUCINEX DM & DELSYM (offered Tussionex if ineffective);  She had the Flu vaccine 10/12...     She denies CP palpit, dizzy, syncope, incr SOB, edema, etc; Cardiac stable & she is thrilled w/ that...    She continues f/u in the [facility] & has been titrated up to Cres10mg  M&F, plus 5mg  on Wednes; last FLP 10/12 was the best yet w/ TChol 167, TG 203, HDL 54, LDL 88...           Problem List:  GLAUCOMA (ICD-365.9) - she is sched for right cataract surg by DrBevis in Feb...  ALLERGIC RHINITIS (ICD-477.9) -  she uses Alavert & Nasonex Prn; followed by DrESL...  ASTHMA & Asthmatic Bronchitis >> on SYMBICORT 80- 2spBid and improved... ~  5/11:  notes incr chest symptoms in the heat & rec to take the Symbicort 2sp Bid... ~  2012:  After her CABG & repeat hosp by [facility] she is noted to be off her inhalers... ~  10/12:  Treated w/ Zpak, Pred, Mucinex, plus her Symbicort80 & improved...  HYPERTENSION (ICD-401.9) - controlled on TOPROL 50mg Bid & HYZAAR 100-12.5 daily (off Lisinopril due to ACE cough)...  ~  8/12: BP=  128/84 & tol meds well; denies HA, fatigue, visual changes, CP, palipit, syncope, dyspnea, edema, etc... ~  2/13: BP= 140/64 & she remains essentially asymptomatic on Rx...  CORONARY ARTERY DISEASE (ICD-414.00) - on ASA 81mg /d (intol to 325 she says) & off Plavix per Walker Kehr...  Cardiolite 9/08 showed CP & HBP response, EF+85%... had cath 10/08 w/ single vessel dis w/ mod ostial LAD stenosis and patent LAD stent, normal LVF.[name]. option for med Rx vs off pump LIMA... she notes some CP/ pressure "burning" when walking up a certain hill during her daily walks, but states this is stable and "no worse"... ~  Myoview 9/09 was neg- no scar or ischemia, EF= 89%, hypertensive response- Lisinopril10 added. ~  f/u Myoview 3/11 was neg- no scar or ischemia, EF= 86%, norm BP response, fair exerc capacity. ~  saw Walker Kehr 5/11- no CP, he stopped her Plavix, stable- continue other meds same. ~  She had recurrent CP 11/11 w/ in-stent restenosis on cath> subseq 3 vessel CABG by DrGearhart & doing well since then... ~  6/12:  Hosp w/ CP & repeat cath showed CAD & 3/3 grafts patent, EF= 65-70%  HYPERLIPIDEMIA (ICD-272.4) - on CRESTOR 10mg  on M&F, plus 5mg  on Wednes, FISH OIL 1200mg  Tid + Flax Seed Oil... she has been intol to statins in the past>  tried low dose Trilipix but stopped this due to "thigh burning">  now followed in the [facility]--- ~  FLP 7/08 showed TChol 217, TG 236, HDL 46, LDL 150...  ~  FLP  6/09 showed TChol 233, TG 263, HDL 46, LDL 150... she agrees to try LOW DOSE Trilipix. ~  FLP 11/09 showed TChol 162, TG 167, HDL 45, LDL 83... ~  FLP 11/10 showed TChol 240, TG 271, HDL 45, LDL 150... [facility] Rx w/ FishOil & FlaxSeedOil. ~  FLP 2/11 showed TChol 285, TG 156, HDL 79, LDL 180... LC is aware & working w/ her regularly. ~  FLP 5/11 showed TChol 277, TG 241, HDL55, LDL 193...  LC added Cres10 just 2d per week. ~  FLP 10/11 showed TChol 198, TG 215,  HDL 53, LDL 109... much improved on Cres10 twice weekly. ~  She continues to f/u w/ Lipid clinic & they titrated up the Cres10 on M&F, plus 5mg  on Wednes> ~  FLP  10/12 was the best yet w/ TChol 167, TG 203, HDL 54, LDL 88  GERD (ICD-530.81) - on OMEPRAZOLE 40mg /d... last EGD by DrPatterson 8/07 revealed sm HH, & gastritis and dilatation done... (RUT=neg, PPI Rx)... LEVSIN 0.125mg  helps her esoph spasm... Prn Phenergan for nausea.  DIVERTICULOSIS, COLON (ICD-562.10) & IRRITABLE BOWEL SYNDROME (ICD-564.1) ~  colonoscopy 2/03 by DrPatterson showed divertics and 5 mm polyp... ~  colonoscopy 3/11 showed divertics & cecal polyp= tubular adenoma, w/ f/u planned 75yrs.  GALLSTONES>>  Diagnosed by DrPatterson and referred to [facility] DrWilson, pt asked to call prn fopr incr symptoms to consider surg...  BREAST CANCER >> Abn mammogram 10/12 showing a left breast nodule & subseq surg (partial mastectomy & sentinel node bx) by [name] 12/12 that proved to be an invasive ductal carcinoma, 2.3cm size, w/ neg lymphovasc invasion, clear margins (but close at 1mm), & neg nodes; she had XRT by DrKindard (had her 11th treatment today); and Oncology eval by DrMagrinat who plans hormone Rx later (no chemo she says)...  ~  2/13: She is tolerating treatment well & spirits are up, [name] had to aspirate a seroma & it is now resolved...  FIBROMYALGIA (ICD-729.1) - she c/o chr fatigue, aching/ sore, on Tramadol & Skelaxin, but most benefit from low dose Hydrocodone  ~1/2 tab Prn... I have recommended an increase exercise program to her... ~  5/11:  Vit D level = 50 on 50000 u weekly by Rock Nephew, NP  HEADACHE (ICD-784.0) - eval at [facility] clinic in 2002, but she notes Rx didn't help... ~  3/11:  presented w/ muscle contraction HA's> Rx Tramadol 50mg  Q6H Prn.  DIZZINESS, CHRONIC (ICD-780.4) - MRI 2/09 per Walker Kehr showed atrophy, sm vessel dis, NAD...  Health Maintenance: ~  GI:  DrPatterson & up to date on colon screening. ~  GYN:  yearly f/u w/ Rock Nephew, NP for PAP, Mammogram at [facility], ?last BMD. ~  Immunizations:  yearly Flu vaccines in fall, Pneumovax in 2007?, TDAP given 5/11...   Past Surgical History  Procedure Date  . Coronary angioplasty with stent placement     Stent 2007  . Open heart surgery     01/19/2010  . Coronary artery bypass graft   . Eye surgery     bilateral cataract removal,  . Breast mass excision 02/06/11    left  . Left lumpectomy 02/06/2011    Outpatient Encounter Prescriptions as of 04/02/2011  Medication Sig Dispense Refill  . aspirin 81 MG tablet Take 81 mg by mouth daily.        . budesonide-formoterol (SYMBICORT) 80-4.5 MCG/ACT inhaler Inhale 2 puffs into the lungs daily.        . Coenzyme Q10 (COQ10) 100 MG CAPS Take 1 capsule by mouth daily.        . fexofenadine (ALLEGRA) 180 MG tablet Take 180 mg by mouth daily.        . Glucosamine-Chondroit-Vit C-Mn (GLUCOSAMINE CHONDR 1500 COMPLX PO) Take 1 tablet by mouth 3 (three) times daily.        [name] losartan-hydrochlorothiazide (HYZAAR) 100-12.5 MG per tablet take 1 tablet by mouth once daily  30 tablet  12  . metoprolol (LOPRESSOR) 50 MG tablet take 1 tablet by mouth twice a day  60 tablet  10  . mometasone (  NASONEX) 50 MCG/ACT nasal spray 2 sprays by Nasal route daily.        . nitroGLYCERIN (NITROSTAT) 0.4 MG SL tablet Take one tablet  By mouth SL every 5 minutes for a total of 3 doses as needed for chest pain.  25 tablet  3  . Omega-3 Fatty Acids  (FISH OIL) 1200 MG CAPS 2 tabs po bid       . omeprazole (PRILOSEC) 40 MG capsule take 1 capsule by mouth once daily 30 MINUTES BEFORE THE FIRST MEAL OF THE DAY  30 capsule  6  . rosuvastatin (CRESTOR) 10 MG tablet Take 1 tablet (10 mg total) by mouth daily. 1 tab po daily( except on Tuesday and Thursday no tabs )  and on Wednesday 1/2 tab daily  30 tablet  6  . traMADol (ULTRAM) 50 MG tablet Take 50 mg by mouth as needed. For pain      . travoprost, benzalkonium, (TRAVATAN) 0.004 % ophthalmic solution Place 1 drop into both eyes at bedtime.       . vitamin B-12 (CYANOCOBALAMIN) 1000 MCG tablet Take 1,000 mcg by mouth daily.        . Vitamin D, Ergocalciferol, (DRISDOL) 50000 UNITS CAPS Take 50,000 Units by mouth every 30 (thirty) days. Once a month      . DISCONTD: ciprofloxacin (CIPRO) 500 MG tablet Take 500 mg by mouth 2 (two) times daily.          Allergies  Allergen Reactions  . Simvastatin Other (See Comments)    REACTION: pt states INTOL to STATINS \\T \ refuses to restart  . Trilipix Other (See Comments)    REACTION: pt states INTOL to Trilipix w/ "thigh burning"  . Hydrocodone     Nightmare after taking cough syrup w/hydrocodone  . Adhesive (Tape) Rash  . Ceclor (Cefaclor) Rash  . Clarithromycin Rash  . Codeine Nausea Only  . Doxycycline Rash  . Lisinopril Cough    Developed ACE cough...  . Penicillins Itching and Rash    At injection site  . Tobradex Rash    Current Medications, Allergies, Past Medical History, Past Surgical History, Family History, and Social History were reviewed in [employer] record.    Review of Systems    See HPI - all other systems neg except as noted...  The patient complains of chest pain and headaches.  The patient denies anorexia, fever, weight loss, weight gain, vision loss, decreased hearing, hoarseness, syncope, dyspnea on exertion, peripheral edema, prolonged cough, hemoptysis, abdominal pain, melena, hematochezia,  severe indigestion/heartburn, hematuria, incontinence, muscle weakness, suspicious skin lesions, transient blindness, difficulty walking, depression, unusual weight change, abnormal bleeding, enlarged lymph nodes, and angioedema.   Objective:   Physical Exam    WD, WN, 76 y/o WF in NAD... GENERAL:  Alert & oriented; pleasant & cooperative... HEENT:  [place]/AT, EOM-wnl, PERRLA, EACs-clear, TMs-wnl, NOSE-clear, THROAT-clear & wnl. NECK:  Supple w/ fair ROM; no JVD; normal carotid impulses w/o bruits; no thyromegaly or nodules palpated; no lymphadenopathy. CHEST:  Clear to P & A; without wheezes/ rales/ or rhonchi heard... chest wall is tender & reproduces her pain... HEART:  Regular Rhythm; without murmurs/ rubs/ or gallops detected... ABDOMEN:  Soft & nontender; normal bowel sounds; no organomegaly or masses palpated... EXT: without deformities, mild arthritic changes and +trigger points, no varicose veins/ venous insuffic/ or edema. NEURO:  CN's intact; motor testing normal; sensory testing normal; gait normal & balance OK. DERM:  No lesions noted; no rash etc..[name]  RADIOLOGY DATA:  Reviewed in the EPIC EMR & discussed w/ the patient...  LABORATORY DATA:  Reviewed in the EPIC EMR & discussed w/ the patient...   Assessment & Plan:   BREAST CANCER>  SEE ABOVE> treated by [name], DrMagrinat, DrKinard & records reviewed...  Asthma/ AR>  Stable following her URI exac in 10/12; on Symbicort 2spBid, Mucinex DM 2Bid & Delsym 2tsp Bid prn...  HBP>  Controlled on Toprol & Hyzaar, continue same...  CAD>  Cath w/ patent grafts; had f/u DrNishan- continue current meds, no changes made...  CHOL>  Followed in the [facility] (on Cres10-5-10 MWF) & FLP much improved...  GI> GERD, Divertics, Gallstones>  Followed by [employer] & stones eval by DrWilson [facility]; continue meds...  FM/ HAs/ etc>  On Ultram, Tylenol, etc...  Other medical issues as noted> on CoQ10, Glucosamine, Vit B12, Vit D,  etc...   Patient's Medications  New Prescriptions   No medications on file  Previous Medications   ASPIRIN 81 MG TABLET    Take 81 mg by mouth daily.     BUDESONIDE-FORMOTEROL (SYMBICORT) 80-4.5 MCG/ACT INHALER    Inhale 2 puffs into the lungs daily.     COENZYME Q10 (COQ10) 100 MG CAPS    Take 1 capsule by mouth daily.     FEXOFENADINE (ALLEGRA) 180 MG TABLET    Take 180 mg by mouth daily.     GLUCOSAMINE-CHONDROIT-VIT C-MN (GLUCOSAMINE CHONDR 1500 COMPLX PO)    Take 1 tablet by mouth 3 (three) times daily.     LOSARTAN-HYDROCHLOROTHIAZIDE (HYZAAR) 100-12.5 MG PER TABLET    take 1 tablet by mouth once daily   METOPROLOL (LOPRESSOR) 50 MG TABLET    take 1 tablet by mouth twice a day   MOMETASONE (NASONEX) 50 MCG/ACT NASAL SPRAY    2 sprays by Nasal route daily.     NITROGLYCERIN (NITROSTAT) 0.4 MG SL TABLET    Take one tablet  By mouth SL every 5 minutes for a total of 3 doses as needed for chest pain.   OMEGA-3 FATTY ACIDS (FISH OIL) 1200 MG CAPS    2 tabs po bid    OMEPRAZOLE (PRILOSEC) 40 MG CAPSULE    take 1 capsule by mouth once daily 30 MINUTES BEFORE THE FIRST MEAL OF THE DAY   ROSUVASTATIN (CRESTOR) 10 MG TABLET    Take 1 tablet (10 mg total) by mouth daily. 1 tab po daily( except on Tuesday and Thursday no tabs )  and on Wednesday 1/2 tab daily   TRAMADOL (ULTRAM) 50 MG TABLET    Take 50 mg by mouth as needed. For pain   TRAVOPROST, BENZALKONIUM, (TRAVATAN) 0.004 % OPHTHALMIC SOLUTION    Place 1 drop into both eyes at bedtime.    VITAMIN B-12 (CYANOCOBALAMIN) 1000 MCG TABLET    Take 1,000 mcg by mouth daily.     VITAMIN D, ERGOCALCIFEROL, (DRISDOL) 50000 UNITS CAPS    Take 50,000 Units by mouth every 30 (thirty) days. Once a month  Modified Medications   No medications on file  Discontinued Medications   CIPROFLOXACIN (CIPRO) 500 MG TABLET    Take 500 mg by mouth 2 (two) times daily.

## 2011-04-02 NOTE — Progress Notes (Signed)
DIAGNOSIS:  Left breast cancer.  NARRATIVE:  [name] seen today for weekly assessment.  She has completed 11/35 planned treatments directed at the left breast area (1980 cGy of a planned 6440 cGy).  The patient has noticed occasional sharp shooting pains within the breast area but no other issues at this time.  EXAMINATION:  The patient's weight is 132.9 pounds.  Lungs:  Clear. Heart:  The heart has a regular rhythm and rate.  Breasts:  Examination of the left breast area reveals some mild erythema.  There are no signs of infection in the breast.  IMPRESSION AND PLAN:  The patient is tolerating her radiation treatments well at this time.  The patient's radiation fields are setting up accurately.  The patient's radiation chart was checked today.  Plan is to continue with breast conservation therapy to a cumulative dose of 6440 cGy.    ______________________________ Billie Lade, Ph.D., M.D. JDK/MEDQ  D:  04/02/2011  T:  04/02/2011  Job:  [id number]

## 2011-04-02 NOTE — Progress Notes (Signed)
Here for weekly radiation MD visit. Has completed 11/28 tx to left breast. No skin changes. occasional fatigue clears with rest.

## 2011-04-03 ENCOUNTER — Ambulatory Visit
Admission: RE | Admit: 2011-04-03 | Discharge: 2011-04-03 | Disposition: A | Discharge status: Home / Self Care | Payer: Medicare Other | Source: Ambulatory Visit | Attending: Radiation Oncology | Admitting: Radiation Oncology

## 2011-04-03 DIAGNOSIS — Z79899 Other long term (current) drug therapy: Secondary | ICD-10-CM | POA: Diagnosis not present

## 2011-04-03 DIAGNOSIS — C50919 Malignant neoplasm of unspecified site of unspecified female breast: Secondary | ICD-10-CM | POA: Diagnosis not present

## 2011-04-03 DIAGNOSIS — T8140XA Infection following a procedure, unspecified, initial encounter: Secondary | ICD-10-CM | POA: Diagnosis not present

## 2011-04-03 DIAGNOSIS — J309 Allergic rhinitis, unspecified: Secondary | ICD-10-CM | POA: Diagnosis not present

## 2011-04-03 DIAGNOSIS — H4011X0 Primary open-angle glaucoma, stage unspecified: Secondary | ICD-10-CM | POA: Diagnosis not present

## 2011-04-03 DIAGNOSIS — Z510 Encounter for antineoplastic radiation therapy: Secondary | ICD-10-CM | POA: Diagnosis not present

## 2011-04-03 DIAGNOSIS — Z170 Estrogen receptor positive status [ER+]: Secondary | ICD-10-CM | POA: Diagnosis not present

## 2011-04-03 DIAGNOSIS — L989 Disorder of the skin and subcutaneous tissue, unspecified: Secondary | ICD-10-CM | POA: Diagnosis not present

## 2011-04-04 ENCOUNTER — Ambulatory Visit
Admission: RE | Admit: 2011-04-04 | Discharge: 2011-04-04 | Disposition: A | Discharge status: Home / Self Care | Payer: Medicare Other | Source: Ambulatory Visit | Attending: Radiation Oncology | Admitting: Radiation Oncology

## 2011-04-04 DIAGNOSIS — T8140XA Infection following a procedure, unspecified, initial encounter: Secondary | ICD-10-CM | POA: Diagnosis not present

## 2011-04-04 DIAGNOSIS — Z79899 Other long term (current) drug therapy: Secondary | ICD-10-CM | POA: Diagnosis not present

## 2011-04-04 DIAGNOSIS — Z510 Encounter for antineoplastic radiation therapy: Secondary | ICD-10-CM | POA: Diagnosis not present

## 2011-04-04 DIAGNOSIS — Z170 Estrogen receptor positive status [ER+]: Secondary | ICD-10-CM | POA: Diagnosis not present

## 2011-04-04 DIAGNOSIS — L989 Disorder of the skin and subcutaneous tissue, unspecified: Secondary | ICD-10-CM | POA: Diagnosis not present

## 2011-04-04 DIAGNOSIS — C50919 Malignant neoplasm of unspecified site of unspecified female breast: Secondary | ICD-10-CM | POA: Diagnosis not present

## 2011-04-05 ENCOUNTER — Ambulatory Visit
Admission: RE | Admit: 2011-04-05 | Discharge: 2011-04-05 | Disposition: A | Discharge status: Home / Self Care | Payer: Medicare Other | Source: Ambulatory Visit | Attending: Radiation Oncology | Admitting: Radiation Oncology

## 2011-04-05 DIAGNOSIS — L989 Disorder of the skin and subcutaneous tissue, unspecified: Secondary | ICD-10-CM | POA: Diagnosis not present

## 2011-04-05 DIAGNOSIS — Z510 Encounter for antineoplastic radiation therapy: Secondary | ICD-10-CM | POA: Diagnosis not present

## 2011-04-05 DIAGNOSIS — Z170 Estrogen receptor positive status [ER+]: Secondary | ICD-10-CM | POA: Diagnosis not present

## 2011-04-05 DIAGNOSIS — C50919 Malignant neoplasm of unspecified site of unspecified female breast: Secondary | ICD-10-CM | POA: Diagnosis not present

## 2011-04-05 DIAGNOSIS — T8140XA Infection following a procedure, unspecified, initial encounter: Secondary | ICD-10-CM | POA: Diagnosis not present

## 2011-04-05 DIAGNOSIS — Z79899 Other long term (current) drug therapy: Secondary | ICD-10-CM | POA: Diagnosis not present

## 2011-04-08 ENCOUNTER — Ambulatory Visit
Admission: RE | Admit: 2011-04-08 | Discharge: 2011-04-08 | Disposition: A | Discharge status: Home / Self Care | Payer: Medicare Other | Source: Ambulatory Visit | Attending: Radiation Oncology | Admitting: Radiation Oncology

## 2011-04-08 DIAGNOSIS — L989 Disorder of the skin and subcutaneous tissue, unspecified: Secondary | ICD-10-CM | POA: Diagnosis not present

## 2011-04-08 DIAGNOSIS — C50919 Malignant neoplasm of unspecified site of unspecified female breast: Secondary | ICD-10-CM | POA: Diagnosis not present

## 2011-04-08 DIAGNOSIS — Z510 Encounter for antineoplastic radiation therapy: Secondary | ICD-10-CM | POA: Diagnosis not present

## 2011-04-08 DIAGNOSIS — T8140XA Infection following a procedure, unspecified, initial encounter: Secondary | ICD-10-CM | POA: Diagnosis not present

## 2011-04-08 DIAGNOSIS — Z79899 Other long term (current) drug therapy: Secondary | ICD-10-CM | POA: Diagnosis not present

## 2011-04-08 DIAGNOSIS — Z170 Estrogen receptor positive status [ER+]: Secondary | ICD-10-CM | POA: Diagnosis not present

## 2011-04-09 ENCOUNTER — Ambulatory Visit
Admission: RE | Admit: 2011-04-09 | Discharge: 2011-04-09 | Disposition: A | Discharge status: Home / Self Care | Payer: Medicare Other | Source: Ambulatory Visit | Attending: Radiation Oncology | Admitting: Radiation Oncology

## 2011-04-09 DIAGNOSIS — M545 Low back pain: Secondary | ICD-10-CM | POA: Diagnosis not present

## 2011-04-09 DIAGNOSIS — C50919 Malignant neoplasm of unspecified site of unspecified female breast: Secondary | ICD-10-CM | POA: Diagnosis not present

## 2011-04-09 DIAGNOSIS — C50519 Malignant neoplasm of lower-outer quadrant of unspecified female breast: Secondary | ICD-10-CM

## 2011-04-09 DIAGNOSIS — M9981 Other biomechanical lesions of cervical region: Secondary | ICD-10-CM | POA: Diagnosis not present

## 2011-04-09 DIAGNOSIS — Z510 Encounter for antineoplastic radiation therapy: Secondary | ICD-10-CM | POA: Diagnosis not present

## 2011-04-09 DIAGNOSIS — Z170 Estrogen receptor positive status [ER+]: Secondary | ICD-10-CM | POA: Diagnosis not present

## 2011-04-09 DIAGNOSIS — Z79899 Other long term (current) drug therapy: Secondary | ICD-10-CM | POA: Diagnosis not present

## 2011-04-09 DIAGNOSIS — L989 Disorder of the skin and subcutaneous tissue, unspecified: Secondary | ICD-10-CM | POA: Diagnosis not present

## 2011-04-09 DIAGNOSIS — M5030 Other cervical disc degeneration, unspecified cervical region: Secondary | ICD-10-CM | POA: Diagnosis not present

## 2011-04-09 DIAGNOSIS — T8140XA Infection following a procedure, unspecified, initial encounter: Secondary | ICD-10-CM | POA: Diagnosis not present

## 2011-04-09 NOTE — Progress Notes (Signed)
Here for routine weekly md visit. Has completed 16/35 treatments to left breast. Skin mildy pink. No breaks or irritation. Feels occasional throbbing pain in breast. Has increased fatigue.

## 2011-04-09 NOTE — Progress Notes (Signed)
DIAGNOSIS:  Left breast cancer.  NARRATIVE:  [name] is seen today for weekly assessment.  She has completed 2880 cGy of a planned 6440 cGy directed at the left breast area.  The patient is starting to have some fatigue.  She has minimal pruritus and discomfort in the breast area.  On examination the patient's weight is 135 pounds which is up a couple pounds since her weighing last week.  The lungs are clear.  The heart has regular rhythm and rate.  Examination of the left breast reveals some hyperpigmentation changes and erythema as well as some swelling. There is no obvious sign of infection in the breast.  There is no moist desquamation noted.  IMPRESSION AND PLAN:  The patient is tolerating her radiation treatments reasonably well at this time.  The patient's radiation fields are setting up accurately.  The patient's radiation chart was checked today. Plan is to continue with breast conservation therapy to a cumulative dose of 6440 cGy.    ______________________________ Billie Lade, Ph.D., M.D. JDK/MEDQ  D:  04/09/2011  T:  04/09/2011  Job:  [id number]

## 2011-04-10 ENCOUNTER — Ambulatory Visit
Admission: RE | Admit: 2011-04-10 | Discharge: 2011-04-10 | Disposition: A | Discharge status: Home / Self Care | Payer: Medicare Other | Source: Ambulatory Visit | Attending: Radiation Oncology | Admitting: Radiation Oncology

## 2011-04-10 DIAGNOSIS — T8140XA Infection following a procedure, unspecified, initial encounter: Secondary | ICD-10-CM | POA: Diagnosis not present

## 2011-04-10 DIAGNOSIS — Z79899 Other long term (current) drug therapy: Secondary | ICD-10-CM | POA: Diagnosis not present

## 2011-04-10 DIAGNOSIS — L989 Disorder of the skin and subcutaneous tissue, unspecified: Secondary | ICD-10-CM | POA: Diagnosis not present

## 2011-04-10 DIAGNOSIS — Z510 Encounter for antineoplastic radiation therapy: Secondary | ICD-10-CM | POA: Diagnosis not present

## 2011-04-10 DIAGNOSIS — Z170 Estrogen receptor positive status [ER+]: Secondary | ICD-10-CM | POA: Diagnosis not present

## 2011-04-10 DIAGNOSIS — C50919 Malignant neoplasm of unspecified site of unspecified female breast: Secondary | ICD-10-CM | POA: Diagnosis not present

## 2011-04-11 ENCOUNTER — Ambulatory Visit
Admission: RE | Admit: 2011-04-11 | Discharge: 2011-04-11 | Disposition: A | Discharge status: Home / Self Care | Payer: Medicare Other | Source: Ambulatory Visit | Attending: Radiation Oncology | Admitting: Radiation Oncology

## 2011-04-11 DIAGNOSIS — Z79899 Other long term (current) drug therapy: Secondary | ICD-10-CM | POA: Diagnosis not present

## 2011-04-11 DIAGNOSIS — Z170 Estrogen receptor positive status [ER+]: Secondary | ICD-10-CM | POA: Diagnosis not present

## 2011-04-11 DIAGNOSIS — L989 Disorder of the skin and subcutaneous tissue, unspecified: Secondary | ICD-10-CM | POA: Diagnosis not present

## 2011-04-11 DIAGNOSIS — Z510 Encounter for antineoplastic radiation therapy: Secondary | ICD-10-CM | POA: Diagnosis not present

## 2011-04-11 DIAGNOSIS — T8140XA Infection following a procedure, unspecified, initial encounter: Secondary | ICD-10-CM | POA: Diagnosis not present

## 2011-04-11 DIAGNOSIS — C50919 Malignant neoplasm of unspecified site of unspecified female breast: Secondary | ICD-10-CM | POA: Diagnosis not present

## 2011-04-12 ENCOUNTER — Ambulatory Visit
Admission: RE | Admit: 2011-04-12 | Discharge: 2011-04-12 | Disposition: A | Discharge status: Home / Self Care | Payer: Medicare Other | Source: Ambulatory Visit | Attending: Radiation Oncology | Admitting: Radiation Oncology

## 2011-04-12 DIAGNOSIS — J309 Allergic rhinitis, unspecified: Secondary | ICD-10-CM | POA: Diagnosis not present

## 2011-04-12 DIAGNOSIS — C50919 Malignant neoplasm of unspecified site of unspecified female breast: Secondary | ICD-10-CM | POA: Diagnosis not present

## 2011-04-12 DIAGNOSIS — Z79899 Other long term (current) drug therapy: Secondary | ICD-10-CM | POA: Diagnosis not present

## 2011-04-12 DIAGNOSIS — L989 Disorder of the skin and subcutaneous tissue, unspecified: Secondary | ICD-10-CM | POA: Diagnosis not present

## 2011-04-12 DIAGNOSIS — Z510 Encounter for antineoplastic radiation therapy: Secondary | ICD-10-CM | POA: Diagnosis not present

## 2011-04-12 DIAGNOSIS — T8140XA Infection following a procedure, unspecified, initial encounter: Secondary | ICD-10-CM | POA: Diagnosis not present

## 2011-04-12 DIAGNOSIS — Z170 Estrogen receptor positive status [ER+]: Secondary | ICD-10-CM | POA: Diagnosis not present

## 2011-04-15 ENCOUNTER — Ambulatory Visit
Admission: RE | Admit: 2011-04-15 | Discharge: 2011-04-15 | Disposition: A | Discharge status: Home / Self Care | Payer: Medicare Other | Source: Ambulatory Visit | Attending: Radiation Oncology | Admitting: Radiation Oncology

## 2011-04-15 DIAGNOSIS — Z79899 Other long term (current) drug therapy: Secondary | ICD-10-CM | POA: Diagnosis not present

## 2011-04-15 DIAGNOSIS — T8140XA Infection following a procedure, unspecified, initial encounter: Secondary | ICD-10-CM | POA: Diagnosis not present

## 2011-04-15 DIAGNOSIS — L989 Disorder of the skin and subcutaneous tissue, unspecified: Secondary | ICD-10-CM | POA: Diagnosis not present

## 2011-04-15 DIAGNOSIS — C50919 Malignant neoplasm of unspecified site of unspecified female breast: Secondary | ICD-10-CM | POA: Diagnosis not present

## 2011-04-15 DIAGNOSIS — Z170 Estrogen receptor positive status [ER+]: Secondary | ICD-10-CM | POA: Diagnosis not present

## 2011-04-15 DIAGNOSIS — Z510 Encounter for antineoplastic radiation therapy: Secondary | ICD-10-CM | POA: Diagnosis not present

## 2011-04-16 ENCOUNTER — Encounter: Payer: Self-pay | Admitting: Radiation Oncology

## 2011-04-16 ENCOUNTER — Ambulatory Visit
Admission: RE | Admit: 2011-04-16 | Discharge: 2011-04-16 | Disposition: A | Discharge status: Home / Self Care | Payer: Medicare Other | Source: Ambulatory Visit | Attending: Radiation Oncology | Admitting: Radiation Oncology

## 2011-04-16 VITALS — Wt 134.7 lb

## 2011-04-16 DIAGNOSIS — Z510 Encounter for antineoplastic radiation therapy: Secondary | ICD-10-CM | POA: Diagnosis not present

## 2011-04-16 DIAGNOSIS — L989 Disorder of the skin and subcutaneous tissue, unspecified: Secondary | ICD-10-CM | POA: Diagnosis not present

## 2011-04-16 DIAGNOSIS — T8140XA Infection following a procedure, unspecified, initial encounter: Secondary | ICD-10-CM | POA: Diagnosis not present

## 2011-04-16 DIAGNOSIS — C50519 Malignant neoplasm of lower-outer quadrant of unspecified female breast: Secondary | ICD-10-CM

## 2011-04-16 DIAGNOSIS — Z170 Estrogen receptor positive status [ER+]: Secondary | ICD-10-CM | POA: Diagnosis not present

## 2011-04-16 DIAGNOSIS — J309 Allergic rhinitis, unspecified: Secondary | ICD-10-CM | POA: Diagnosis not present

## 2011-04-16 DIAGNOSIS — Z79899 Other long term (current) drug therapy: Secondary | ICD-10-CM | POA: Diagnosis not present

## 2011-04-16 DIAGNOSIS — C50919 Malignant neoplasm of unspecified site of unspecified female breast: Secondary | ICD-10-CM | POA: Diagnosis not present

## 2011-04-16 NOTE — Progress Notes (Signed)
DIAGNOSIS:  Left breast cancer.  NARRATIVE:  [name] is seen today for weekly assessment.  She has completed 21/35 planned treatments directed at the left breast area (3,780 cGy of a planned 6,440 cGy).  The patient has noticed some discomfort in her left arm, but denies any obvious swelling in this area.  She has also noticed some mild pruritus and discomfort in the breast area.  The patient does have some fatigue, but continues to be quite active.  PHYSICAL EXAMINATION:  Lungs:  Clear.  Heart:  Regular rhythm and rate. The patient's weight is stable at 134 pounds.  Breasts:  Examination of the left breast reveals some hyperpigmentation changes and erythema as well as mild swelling.  Extremities:  Examination left arm reveals no palpable cords, significant swelling, or palpable mass.  IMPRESSION AND PLAN:  The patient is tolerating her treatments reasonably well except for issues as above.  The patient's radiation fields are setting up accurately.  The patient's radiation chart was checked today.  The plan is to continue to a cumulative dose of 6,440 cGy.    ______________________________ Billie Lade, Ph.D., M.D. JDK/MEDQ  D:  04/16/2011  T:  04/16/2011  Job:  [id number]

## 2011-04-16 NOTE — Progress Notes (Signed)
Pt has slight erythema left breast ,occasional  Zings throughout breast, no skin breakdown, stated pt, no c/o pain ,eating and drinking well 10:24 AM

## 2011-04-17 ENCOUNTER — Ambulatory Visit
Admission: RE | Admit: 2011-04-17 | Discharge: 2011-04-17 | Disposition: A | Discharge status: Home / Self Care | Payer: Medicare Other | Source: Ambulatory Visit | Attending: Radiation Oncology | Admitting: Radiation Oncology

## 2011-04-17 DIAGNOSIS — L989 Disorder of the skin and subcutaneous tissue, unspecified: Secondary | ICD-10-CM | POA: Diagnosis not present

## 2011-04-17 DIAGNOSIS — T8140XA Infection following a procedure, unspecified, initial encounter: Secondary | ICD-10-CM | POA: Diagnosis not present

## 2011-04-17 DIAGNOSIS — C50919 Malignant neoplasm of unspecified site of unspecified female breast: Secondary | ICD-10-CM | POA: Diagnosis not present

## 2011-04-17 DIAGNOSIS — Z79899 Other long term (current) drug therapy: Secondary | ICD-10-CM | POA: Diagnosis not present

## 2011-04-17 DIAGNOSIS — Z170 Estrogen receptor positive status [ER+]: Secondary | ICD-10-CM | POA: Diagnosis not present

## 2011-04-17 DIAGNOSIS — Z510 Encounter for antineoplastic radiation therapy: Secondary | ICD-10-CM | POA: Diagnosis not present

## 2011-04-18 ENCOUNTER — Ambulatory Visit
Admission: RE | Admit: 2011-04-18 | Discharge: 2011-04-18 | Disposition: A | Discharge status: Home / Self Care | Payer: Medicare Other | Source: Ambulatory Visit | Attending: Radiation Oncology | Admitting: Radiation Oncology

## 2011-04-18 DIAGNOSIS — T8140XA Infection following a procedure, unspecified, initial encounter: Secondary | ICD-10-CM | POA: Diagnosis not present

## 2011-04-18 DIAGNOSIS — C50919 Malignant neoplasm of unspecified site of unspecified female breast: Secondary | ICD-10-CM | POA: Diagnosis not present

## 2011-04-18 DIAGNOSIS — L989 Disorder of the skin and subcutaneous tissue, unspecified: Secondary | ICD-10-CM | POA: Diagnosis not present

## 2011-04-18 DIAGNOSIS — Z79899 Other long term (current) drug therapy: Secondary | ICD-10-CM | POA: Diagnosis not present

## 2011-04-18 DIAGNOSIS — Z510 Encounter for antineoplastic radiation therapy: Secondary | ICD-10-CM | POA: Diagnosis not present

## 2011-04-18 DIAGNOSIS — Z170 Estrogen receptor positive status [ER+]: Secondary | ICD-10-CM | POA: Diagnosis not present

## 2011-04-19 ENCOUNTER — Ambulatory Visit
Admission: RE | Admit: 2011-04-19 | Discharge: 2011-04-19 | Disposition: A | Discharge status: Home / Self Care | Payer: Medicare Other | Source: Ambulatory Visit | Attending: Radiation Oncology | Admitting: Radiation Oncology

## 2011-04-19 DIAGNOSIS — Z79899 Other long term (current) drug therapy: Secondary | ICD-10-CM | POA: Diagnosis not present

## 2011-04-19 DIAGNOSIS — T8140XA Infection following a procedure, unspecified, initial encounter: Secondary | ICD-10-CM | POA: Diagnosis not present

## 2011-04-19 DIAGNOSIS — Z170 Estrogen receptor positive status [ER+]: Secondary | ICD-10-CM | POA: Diagnosis not present

## 2011-04-19 DIAGNOSIS — Z510 Encounter for antineoplastic radiation therapy: Secondary | ICD-10-CM | POA: Diagnosis not present

## 2011-04-19 DIAGNOSIS — C50919 Malignant neoplasm of unspecified site of unspecified female breast: Secondary | ICD-10-CM | POA: Diagnosis not present

## 2011-04-19 DIAGNOSIS — L989 Disorder of the skin and subcutaneous tissue, unspecified: Secondary | ICD-10-CM | POA: Diagnosis not present

## 2011-04-22 ENCOUNTER — Ambulatory Visit
Admission: RE | Admit: 2011-04-22 | Discharge: 2011-04-22 | Disposition: A | Discharge status: Home / Self Care | Payer: Medicare Other | Source: Ambulatory Visit | Attending: Radiation Oncology | Admitting: Radiation Oncology

## 2011-04-22 DIAGNOSIS — C50919 Malignant neoplasm of unspecified site of unspecified female breast: Secondary | ICD-10-CM | POA: Diagnosis not present

## 2011-04-22 DIAGNOSIS — Z79899 Other long term (current) drug therapy: Secondary | ICD-10-CM | POA: Diagnosis not present

## 2011-04-22 DIAGNOSIS — Z170 Estrogen receptor positive status [ER+]: Secondary | ICD-10-CM | POA: Diagnosis not present

## 2011-04-22 DIAGNOSIS — C50519 Malignant neoplasm of lower-outer quadrant of unspecified female breast: Secondary | ICD-10-CM

## 2011-04-22 DIAGNOSIS — T8140XA Infection following a procedure, unspecified, initial encounter: Secondary | ICD-10-CM | POA: Diagnosis not present

## 2011-04-22 DIAGNOSIS — L989 Disorder of the skin and subcutaneous tissue, unspecified: Secondary | ICD-10-CM | POA: Diagnosis not present

## 2011-04-22 DIAGNOSIS — Z510 Encounter for antineoplastic radiation therapy: Secondary | ICD-10-CM | POA: Diagnosis not present

## 2011-04-22 NOTE — Progress Notes (Signed)
DIAGNOSIS:  Left breast cancer.  NARRATIVE:  Earlier today [name] underwent additional planning for radiation therapy directed at the left breast area.  The patient's treatment planning CT scan was reviewed and she subsequently had setup of a boost field directed at the site of presentation within the lower outer aspect of the breast.  Given the depth within the breast area, a reduced photon beam arrangement was chosen for treatment.  The patient will be treated with an AP and left posterior oblique arrangement. Custom blocking will be used on both fields.  A computerized isodose plan will be generated for treatment.  TREATMENT PLAN:  The patient is to receive 7 additional treatments at 200 cGy per day for an additional dose of 1400 cGy and a cumulative dose of 6440 cGy.    ______________________________ Billie Lade, Ph.D., M.D. JDK/MEDQ  D:  04/22/2011  T:  04/22/2011  Job:  [id number]

## 2011-04-23 ENCOUNTER — Encounter: Payer: Self-pay | Admitting: Radiation Oncology

## 2011-04-23 ENCOUNTER — Ambulatory Visit
Admission: RE | Admit: 2011-04-23 | Discharge: 2011-04-23 | Disposition: A | Discharge status: Home / Self Care | Payer: Medicare Other | Source: Ambulatory Visit | Attending: Radiation Oncology | Admitting: Radiation Oncology

## 2011-04-23 VITALS — BP 140/73 | HR 61 | Temp 97.0°F | Wt 135.2 lb

## 2011-04-23 DIAGNOSIS — Z510 Encounter for antineoplastic radiation therapy: Secondary | ICD-10-CM | POA: Diagnosis not present

## 2011-04-23 DIAGNOSIS — T8140XA Infection following a procedure, unspecified, initial encounter: Secondary | ICD-10-CM | POA: Diagnosis not present

## 2011-04-23 DIAGNOSIS — C50519 Malignant neoplasm of lower-outer quadrant of unspecified female breast: Secondary | ICD-10-CM

## 2011-04-23 DIAGNOSIS — L989 Disorder of the skin and subcutaneous tissue, unspecified: Secondary | ICD-10-CM | POA: Diagnosis not present

## 2011-04-23 DIAGNOSIS — M5030 Other cervical disc degeneration, unspecified cervical region: Secondary | ICD-10-CM | POA: Diagnosis not present

## 2011-04-23 DIAGNOSIS — M5412 Radiculopathy, cervical region: Secondary | ICD-10-CM | POA: Diagnosis not present

## 2011-04-23 DIAGNOSIS — Z170 Estrogen receptor positive status [ER+]: Secondary | ICD-10-CM | POA: Diagnosis not present

## 2011-04-23 DIAGNOSIS — M9981 Other biomechanical lesions of cervical region: Secondary | ICD-10-CM | POA: Diagnosis not present

## 2011-04-23 DIAGNOSIS — Z79899 Other long term (current) drug therapy: Secondary | ICD-10-CM | POA: Diagnosis not present

## 2011-04-23 DIAGNOSIS — C50919 Malignant neoplasm of unspecified site of unspecified female breast: Secondary | ICD-10-CM | POA: Diagnosis not present

## 2011-04-23 NOTE — Progress Notes (Signed)
DIAGNOSIS:  Left breast cancer.  NARRATIVE:  Mrs. Cotugno is seen today for weekly assessment.  She has completed 25/35 planned treatments directed at the left breast area (4680 cGy of a planned 6440 cGy).  The patient is starting to have some fatigue.  She is bothered by allergies at this time with puffy eyes and fatigue.  The patient has minimal pruritus and discomfort in the left breast area.  PHYSICAL EXAMINATION:  The lungs are clear.  The heart has a regular rhythm and rate.  The patient's temperature is 97, pulse is 61, blood pressure is 140/73.  Examination of the lungs reveals them to be clear. The heart has a regular rhythm and rate.  Examination of the left breast reveals some hyperpigmentation changes and erythema, but no moist desquamation is noted.  IMPRESSION/PLAN:  The patient is tolerating her treatments reasonably well except for issues as above.  The patient's radiation fields are setting up accurately.  The patient's radiation chart was checked today. Plan is to continue with breast conservation therapy to a cumulative dose of 6440 cGy.    ______________________________ Billie Lade, Ph.D., M.D. JDK/MEDQ  D:  04/23/2011  T:  04/23/2011  Job:  [id number]

## 2011-04-23 NOTE — Progress Notes (Signed)
Here for weekly routine under treat visit of left breast radiation. Redness is mild w/o any breaks in skin. Has completed 26/28 whole breast treatments. To start boost Friday. Continue with application of radiaplex bid.Patient puffy around eyes, will continue with allergy meds of allegra and nasonex. Mild fatigue now takes power naps.

## 2011-04-24 ENCOUNTER — Ambulatory Visit
Admission: RE | Admit: 2011-04-24 | Discharge: 2011-04-24 | Disposition: A | Discharge status: Home / Self Care | Payer: Medicare Other | Source: Ambulatory Visit | Attending: Radiation Oncology | Admitting: Radiation Oncology

## 2011-04-24 DIAGNOSIS — C50919 Malignant neoplasm of unspecified site of unspecified female breast: Secondary | ICD-10-CM | POA: Diagnosis not present

## 2011-04-24 DIAGNOSIS — T8140XA Infection following a procedure, unspecified, initial encounter: Secondary | ICD-10-CM | POA: Diagnosis not present

## 2011-04-24 DIAGNOSIS — Z79899 Other long term (current) drug therapy: Secondary | ICD-10-CM | POA: Diagnosis not present

## 2011-04-24 DIAGNOSIS — Z510 Encounter for antineoplastic radiation therapy: Secondary | ICD-10-CM | POA: Diagnosis not present

## 2011-04-24 DIAGNOSIS — J309 Allergic rhinitis, unspecified: Secondary | ICD-10-CM | POA: Diagnosis not present

## 2011-04-24 DIAGNOSIS — Z170 Estrogen receptor positive status [ER+]: Secondary | ICD-10-CM | POA: Diagnosis not present

## 2011-04-24 DIAGNOSIS — L989 Disorder of the skin and subcutaneous tissue, unspecified: Secondary | ICD-10-CM | POA: Diagnosis not present

## 2011-04-25 ENCOUNTER — Ambulatory Visit
Admission: RE | Admit: 2011-04-25 | Discharge: 2011-04-25 | Disposition: A | Discharge status: Home / Self Care | Payer: Medicare Other | Source: Ambulatory Visit | Attending: Radiation Oncology | Admitting: Radiation Oncology

## 2011-04-25 DIAGNOSIS — C50919 Malignant neoplasm of unspecified site of unspecified female breast: Secondary | ICD-10-CM | POA: Diagnosis not present

## 2011-04-25 DIAGNOSIS — Z79899 Other long term (current) drug therapy: Secondary | ICD-10-CM | POA: Diagnosis not present

## 2011-04-25 DIAGNOSIS — C50519 Malignant neoplasm of lower-outer quadrant of unspecified female breast: Secondary | ICD-10-CM

## 2011-04-25 DIAGNOSIS — Z170 Estrogen receptor positive status [ER+]: Secondary | ICD-10-CM | POA: Diagnosis not present

## 2011-04-25 DIAGNOSIS — L989 Disorder of the skin and subcutaneous tissue, unspecified: Secondary | ICD-10-CM | POA: Diagnosis not present

## 2011-04-25 DIAGNOSIS — Z510 Encounter for antineoplastic radiation therapy: Secondary | ICD-10-CM | POA: Diagnosis not present

## 2011-04-25 DIAGNOSIS — T8140XA Infection following a procedure, unspecified, initial encounter: Secondary | ICD-10-CM | POA: Diagnosis not present

## 2011-04-25 NOTE — Progress Notes (Signed)
[facility] Radiation Oncology Simulation Verification Note   Name: [name] MRN: [id number]   Date: @T@  DOB: 07/02/1935  Status:outpatient   DIAGNOSIS:  1. Breast cancer, Left     POSITION: Patient was placed in the supine position on the treatment machine.  Isocenter and MLCS were reviewed and treatment was approved.  NARRATIVE: Patient tolerated simulation well.   

## 2011-04-26 ENCOUNTER — Ambulatory Visit
Admission: RE | Admit: 2011-04-26 | Discharge: 2011-04-26 | Disposition: A | Discharge status: Home / Self Care | Payer: Medicare Other | Source: Ambulatory Visit | Attending: Radiation Oncology | Admitting: Radiation Oncology

## 2011-04-26 DIAGNOSIS — Z510 Encounter for antineoplastic radiation therapy: Secondary | ICD-10-CM | POA: Diagnosis not present

## 2011-04-26 DIAGNOSIS — T8140XA Infection following a procedure, unspecified, initial encounter: Secondary | ICD-10-CM | POA: Diagnosis not present

## 2011-04-26 DIAGNOSIS — Z79899 Other long term (current) drug therapy: Secondary | ICD-10-CM | POA: Diagnosis not present

## 2011-04-26 DIAGNOSIS — L989 Disorder of the skin and subcutaneous tissue, unspecified: Secondary | ICD-10-CM | POA: Diagnosis not present

## 2011-04-26 DIAGNOSIS — C50919 Malignant neoplasm of unspecified site of unspecified female breast: Secondary | ICD-10-CM | POA: Diagnosis not present

## 2011-04-26 DIAGNOSIS — Z170 Estrogen receptor positive status [ER+]: Secondary | ICD-10-CM | POA: Diagnosis not present

## 2011-04-29 ENCOUNTER — Ambulatory Visit
Admission: RE | Admit: 2011-04-29 | Discharge: 2011-04-29 | Disposition: A | Discharge status: Home / Self Care | Payer: Medicare Other | Source: Ambulatory Visit | Attending: Radiation Oncology | Admitting: Radiation Oncology

## 2011-04-29 DIAGNOSIS — Z79899 Other long term (current) drug therapy: Secondary | ICD-10-CM | POA: Diagnosis not present

## 2011-04-29 DIAGNOSIS — L989 Disorder of the skin and subcutaneous tissue, unspecified: Secondary | ICD-10-CM | POA: Diagnosis not present

## 2011-04-29 DIAGNOSIS — Z510 Encounter for antineoplastic radiation therapy: Secondary | ICD-10-CM | POA: Diagnosis not present

## 2011-04-29 DIAGNOSIS — Z170 Estrogen receptor positive status [ER+]: Secondary | ICD-10-CM | POA: Diagnosis not present

## 2011-04-29 DIAGNOSIS — C50919 Malignant neoplasm of unspecified site of unspecified female breast: Secondary | ICD-10-CM | POA: Diagnosis not present

## 2011-04-29 DIAGNOSIS — T8140XA Infection following a procedure, unspecified, initial encounter: Secondary | ICD-10-CM | POA: Diagnosis not present

## 2011-04-30 ENCOUNTER — Encounter: Payer: Self-pay | Admitting: Radiation Oncology

## 2011-04-30 ENCOUNTER — Ambulatory Visit
Admission: RE | Admit: 2011-04-30 | Discharge: 2011-04-30 | Disposition: A | Discharge status: Home / Self Care | Payer: Medicare Other | Source: Ambulatory Visit | Attending: Radiation Oncology | Admitting: Radiation Oncology

## 2011-04-30 VITALS — Wt 135.3 lb

## 2011-04-30 DIAGNOSIS — L989 Disorder of the skin and subcutaneous tissue, unspecified: Secondary | ICD-10-CM | POA: Diagnosis not present

## 2011-04-30 DIAGNOSIS — C50519 Malignant neoplasm of lower-outer quadrant of unspecified female breast: Secondary | ICD-10-CM | POA: Diagnosis not present

## 2011-04-30 DIAGNOSIS — C50919 Malignant neoplasm of unspecified site of unspecified female breast: Secondary | ICD-10-CM | POA: Diagnosis not present

## 2011-04-30 DIAGNOSIS — T8140XA Infection following a procedure, unspecified, initial encounter: Secondary | ICD-10-CM | POA: Diagnosis not present

## 2011-04-30 DIAGNOSIS — Z79899 Other long term (current) drug therapy: Secondary | ICD-10-CM | POA: Diagnosis not present

## 2011-04-30 DIAGNOSIS — Z170 Estrogen receptor positive status [ER+]: Secondary | ICD-10-CM | POA: Diagnosis not present

## 2011-04-30 DIAGNOSIS — Z510 Encounter for antineoplastic radiation therapy: Secondary | ICD-10-CM | POA: Diagnosis not present

## 2011-04-30 MED ORDER — RADIAPLEXRX EX GEL
Freq: Once | CUTANEOUS | Status: AC
  Administered 2011-04-30: 1 via TOPICAL

## 2011-04-30 NOTE — Progress Notes (Signed)
Encounter addended by: Tessa Lerner, RN on: 04/30/2011 10:45 AM<BR>     Documentation filed: Inpatient MAR

## 2011-04-30 NOTE — Progress Notes (Signed)
DIAGNOSIS:  Left breast cancer.  NARRATIVE:  [name] is seen today for weekly assessment.  She has completed 5640 cGy of a planned 6440 cGy.  The patient is having some fatigue as well as some itching and mild discomfort in the breast area. Overall she is pleased with how she has tolerated her therapy.  PHYSICAL EXAMINATION:  The lungs are clear.  The heart has a regular rhythm and rate.  Examination of the left breast reveals some hyperpigmentation changes and erythema as well as dry desquamation, but no moist desquamation.  IMPRESSION/PLAN:  The patient is tolerating her treatments reasonably well except for issues as above.  The patient's radiation fields are setting up accurately.  The patient's radiation chart was checked today. The patient was given another tube of RadiaPlex gel to place on her skin.  The patient will continue to a cumulative dose of 6440 cGy.    ______________________________ Billie Lade, Ph.D., M.D. JDK/MEDQ  D:  04/30/2011  T:  04/30/2011  Job:  [id number]

## 2011-04-30 NOTE — Progress Notes (Signed)
Here for routine weekly md assessment for radiation of left breast. Has completed 28 of 35 treatments. Skin hyperpigmented without breaks in skin.Has developed a mild follicular reaction. To continue with application of radiaplex gel 2 to 3 times daily.Knows to apply hydrocortisne 1% if itching unresolved. Additional tube of radiaplex given.

## 2011-05-01 ENCOUNTER — Ambulatory Visit
Admission: RE | Admit: 2011-05-01 | Discharge: 2011-05-01 | Disposition: A | Discharge status: Home / Self Care | Payer: Medicare Other | Source: Ambulatory Visit | Attending: Radiation Oncology | Admitting: Radiation Oncology

## 2011-05-01 DIAGNOSIS — J309 Allergic rhinitis, unspecified: Secondary | ICD-10-CM | POA: Diagnosis not present

## 2011-05-01 DIAGNOSIS — Z79899 Other long term (current) drug therapy: Secondary | ICD-10-CM | POA: Diagnosis not present

## 2011-05-01 DIAGNOSIS — L989 Disorder of the skin and subcutaneous tissue, unspecified: Secondary | ICD-10-CM | POA: Diagnosis not present

## 2011-05-01 DIAGNOSIS — Z510 Encounter for antineoplastic radiation therapy: Secondary | ICD-10-CM | POA: Diagnosis not present

## 2011-05-01 DIAGNOSIS — Z170 Estrogen receptor positive status [ER+]: Secondary | ICD-10-CM | POA: Diagnosis not present

## 2011-05-01 DIAGNOSIS — T8140XA Infection following a procedure, unspecified, initial encounter: Secondary | ICD-10-CM | POA: Diagnosis not present

## 2011-05-01 DIAGNOSIS — C50919 Malignant neoplasm of unspecified site of unspecified female breast: Secondary | ICD-10-CM | POA: Diagnosis not present

## 2011-05-02 ENCOUNTER — Ambulatory Visit
Admission: RE | Admit: 2011-05-02 | Discharge: 2011-05-02 | Disposition: A | Discharge status: Home / Self Care | Payer: Medicare Other | Source: Ambulatory Visit | Attending: Radiation Oncology | Admitting: Radiation Oncology

## 2011-05-02 DIAGNOSIS — L989 Disorder of the skin and subcutaneous tissue, unspecified: Secondary | ICD-10-CM | POA: Diagnosis not present

## 2011-05-02 DIAGNOSIS — C50919 Malignant neoplasm of unspecified site of unspecified female breast: Secondary | ICD-10-CM | POA: Diagnosis not present

## 2011-05-02 DIAGNOSIS — T8140XA Infection following a procedure, unspecified, initial encounter: Secondary | ICD-10-CM | POA: Diagnosis not present

## 2011-05-02 DIAGNOSIS — Z170 Estrogen receptor positive status [ER+]: Secondary | ICD-10-CM | POA: Diagnosis not present

## 2011-05-02 DIAGNOSIS — Z79899 Other long term (current) drug therapy: Secondary | ICD-10-CM | POA: Diagnosis not present

## 2011-05-02 DIAGNOSIS — Z510 Encounter for antineoplastic radiation therapy: Secondary | ICD-10-CM | POA: Diagnosis not present

## 2011-05-03 ENCOUNTER — Ambulatory Visit
Admission: RE | Admit: 2011-05-03 | Discharge: 2011-05-03 | Disposition: A | Discharge status: Home / Self Care | Payer: Medicare Other | Source: Ambulatory Visit | Attending: Radiation Oncology | Admitting: Radiation Oncology

## 2011-05-03 DIAGNOSIS — Z79899 Other long term (current) drug therapy: Secondary | ICD-10-CM | POA: Diagnosis not present

## 2011-05-03 DIAGNOSIS — T8140XA Infection following a procedure, unspecified, initial encounter: Secondary | ICD-10-CM | POA: Diagnosis not present

## 2011-05-03 DIAGNOSIS — Z170 Estrogen receptor positive status [ER+]: Secondary | ICD-10-CM | POA: Diagnosis not present

## 2011-05-03 DIAGNOSIS — Z510 Encounter for antineoplastic radiation therapy: Secondary | ICD-10-CM | POA: Diagnosis not present

## 2011-05-03 DIAGNOSIS — L989 Disorder of the skin and subcutaneous tissue, unspecified: Secondary | ICD-10-CM | POA: Diagnosis not present

## 2011-05-03 DIAGNOSIS — C50919 Malignant neoplasm of unspecified site of unspecified female breast: Secondary | ICD-10-CM | POA: Diagnosis not present

## 2011-05-06 ENCOUNTER — Ambulatory Visit
Admission: RE | Admit: 2011-05-06 | Discharge: 2011-05-06 | Disposition: A | Discharge status: Home / Self Care | Payer: Medicare Other | Source: Ambulatory Visit | Attending: Radiation Oncology | Admitting: Radiation Oncology

## 2011-05-06 ENCOUNTER — Encounter: Payer: Self-pay | Admitting: Radiation Oncology

## 2011-05-06 DIAGNOSIS — C50519 Malignant neoplasm of lower-outer quadrant of unspecified female breast: Secondary | ICD-10-CM

## 2011-05-06 DIAGNOSIS — C50919 Malignant neoplasm of unspecified site of unspecified female breast: Secondary | ICD-10-CM | POA: Diagnosis not present

## 2011-05-06 DIAGNOSIS — T8140XA Infection following a procedure, unspecified, initial encounter: Secondary | ICD-10-CM | POA: Diagnosis not present

## 2011-05-06 DIAGNOSIS — Z510 Encounter for antineoplastic radiation therapy: Secondary | ICD-10-CM | POA: Diagnosis not present

## 2011-05-06 DIAGNOSIS — Z79899 Other long term (current) drug therapy: Secondary | ICD-10-CM | POA: Diagnosis not present

## 2011-05-06 DIAGNOSIS — L989 Disorder of the skin and subcutaneous tissue, unspecified: Secondary | ICD-10-CM | POA: Diagnosis not present

## 2011-05-06 DIAGNOSIS — Z170 Estrogen receptor positive status [ER+]: Secondary | ICD-10-CM | POA: Diagnosis not present

## 2011-05-06 NOTE — Progress Notes (Signed)
   Weekly Management Note: Left breast cancer Current Dose:   6440cGy  Projected Dose:  6440cGy   Narrative:  The patient presents for routine under treatment assessment.  CBCT/MVCT images/Port film x-rays were reviewed.  The chart was checked. She is doing well. Her skin is not particularly bothersome. She will be following up in 1 month with Dr. Roselind Messier. She sees medical oncology in 2 days.  Physical Findings: Weight:  . She is in no acute distress. Her left breast is erythematous. She has some early desquamation at the inframammary fold  Impression:  The patient has tolerated radiotherapy.  Plan: She will followup in one month with [place].

## 2011-05-06 NOTE — Progress Notes (Signed)
Completion of radiation of left breast. 35 treatments. Tolerated well mild discomfort and fatigue. Hyperpigmentation more prominent in axillary region and mammary fold but no peeling.Knows to continue application of radiaplex gel 2 to 3 times daily and if skin breaks to use neosporin in effected area.One month follow up to be scheduled.

## 2011-05-07 DIAGNOSIS — M9981 Other biomechanical lesions of cervical region: Secondary | ICD-10-CM | POA: Diagnosis not present

## 2011-05-07 DIAGNOSIS — M5030 Other cervical disc degeneration, unspecified cervical region: Secondary | ICD-10-CM | POA: Diagnosis not present

## 2011-05-07 DIAGNOSIS — M5412 Radiculopathy, cervical region: Secondary | ICD-10-CM | POA: Diagnosis not present

## 2011-05-08 ENCOUNTER — Ambulatory Visit (HOSPITAL_BASED_OUTPATIENT_CLINIC_OR_DEPARTMENT_OTHER): Payer: 59 | Admitting: Oncology

## 2011-05-08 ENCOUNTER — Other Ambulatory Visit: Payer: 59 | Admitting: Lab

## 2011-05-08 ENCOUNTER — Telehealth: Payer: Self-pay | Admitting: *Deleted

## 2011-05-08 VITALS — BP 144/69 | HR 63 | Temp 97.6°F | Ht 60.0 in | Wt 134.4 lb

## 2011-05-08 DIAGNOSIS — C50519 Malignant neoplasm of lower-outer quadrant of unspecified female breast: Secondary | ICD-10-CM | POA: Diagnosis not present

## 2011-05-08 MED ORDER — LETROZOLE 2.5 MG PO TABS: 2.5000 mg | ORAL_TABLET | Freq: Every day | ORAL | Status: AC

## 2011-05-08 NOTE — Telephone Encounter (Signed)
gave patient appointment for 07-2011 printed out calendar and to the patient

## 2011-05-08 NOTE — Progress Notes (Signed)
ID: [name]   DOB: [birth year]  MR#: [id number]  WUJ#:[id number]  HISTORY OF PRESENT ILLNESS: [name] is a 76 year old [place] woman referred by Dr. Tilda Burrow for evaluation and treatment in the setting of newly diagnosed breast cancer.   The patient had routine screening mammography June 2011 which was unremarkable.  Repeat screening mammography December 11, 2010, at [place], however, showed a possible abnormality in the left breast.  Additional imaging on October 18th showed an irregular, lobulated mass in the lower outer quadrant of the left breast which by ultrasound was irregular and hypoechoic.  It measured 2.5 cm by ultrasound.  The left axilla showed a benign-looking lymph node which did not appear to have changed compared to as seen in prior mammograms.   With this information, the patient underwent biopsy of the left breast mass October 25th, and this showed [phone]) an invasive mammary carcinoma with some lobular features but strongly E-cadherin positive, and so an invasive ductal carcinoma.  There was evidence of perineural invasion, although no definite evidence of angiolymphatic invasion.  The tumor had a CISH ratio of HER-2 to CEP17 signals of 1.15, indicating no amplification.  Estrogen receptor was positive at 100%, progesterone receptor was positive at 61%, and the proliferation marker was 72%.   With this information, the patient was referred to Dr. [name], and bilateral breast MRIs were obtained December 24, 2010.  This showed the mass in the left breast to measure 1.7 cm, to be solitary, to be not contacting the pectoralis, and in addition, there was no suspicious finding in the right breast, no pathologically enlarged axillary or internal mammary chain lymph nodes.   INTERVAL HISTORY: The patient returns today with her daughter for followup of her breast cancer. She just finished her radiation treatments 2 days ago. She generally did well with these. She had  minimal to no desquamation, some fatigue but coated in keeping her from doing everything I needed to do" and overall the experience was "not bad". In fact the worse part was driving here.  REVIEW OF SYSTEMS: She is a little bit of a cough, which occasionally produces a little yellow or even greenish phlegm, but no shortness of breath, no pleurisy, no hemoptysis. She's had no fever. Otherwise detailed review of systems was noncontributory  PAST MEDICAL HISTORY: Past Medical History  Diagnosis Date  . Unspecified glaucoma   . Allergic rhinitis, cause unspecified   . Unspecified asthma   . Unspecified essential hypertension   . Other and unspecified hyperlipidemia   . Gastritis   . Abdominal pain, left lower quadrant   . Diverticulosis of colon (without mention of hemorrhage)   . Irritable bowel syndrome   . Colonic polyp   . Headache   . Dizziness   . Breast cancer, Left 12/20/2010  . Gallstones   . CAD (coronary artery disease)     Dr. Eden Emms, saw last in Oct 2012  . Bronchitis     hx of  . Pneumonia   . Hiatal hernia   . Fibromyalgia   . Esophageal reflux     hiatal hernia  Coronary artery disease status post open heart surgery under Ed Gerhardt November 2011, with bypasses from the left internal mammary artery to the left anterior descending, and then reverse saphenous vein graft to the diagonal coronary artery.  The patient had previously had stents placed by Dr. Eden Emms in 2007.  Other problems include a history of reflux esophagitis, hypertension, hyperlipidemia, rosacea and gallstones.  PAST SURGICAL  HISTORY: Past Surgical History  Procedure Date  . Coronary angioplasty with stent placement     Stent 2007  . Open heart surgery     01/19/2010  . Coronary artery bypass graft   . Eye surgery     bilateral cataract removal,  . Breast mass excision 02/06/11    left  . Left lumpectomy 02/06/2011    FAMILY HISTORY Family History  Problem Relation Age of Onset  . Heart  disease Brother   . Cancer Brother   . Diabetes Sister   The patient's father died at the age of 68 following a stroke.  The patient's mother died at 4 with a history of Alzheimer disease.  The patient had 2 sisters, one of whom developed breast cancer at the age of 89 and survives.  The patient has a brother who apparently had lymphoma.  He is also a survivor.  GYNECOLOGIC HISTORY: Menarche age 75.  Menopause in the mid 1990s.  She never used hormone replacement.  She is a GX, P2.  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  Her husband, [name], is present today.  He has worked as a Curator and is still very active in his shop.   Daughter, [name], 50, lives in [place] and is a home school mom.  Daughter, [name], 40, lives in [place] and works as a Geophysicist/field seismologist for the [employer].  The patient attends the [facility] in [place].   ADVANCED DIRECTIVES:  HEALTH MAINTENANCE: History  Substance Use Topics  . Smoking status: Never Smoker   . Smokeless tobacco: Never Used  . Alcohol Use: No     Colonoscopy: D. Patterson  PAP: 2011  Bone density: 2012/[facility]  Lipid panel:  Allergies  Allergen Reactions  . Simvastatin Other (See Comments)    REACTION: pt states INTOL to STATINS \\T \ refuses to restart  . Trilipix Other (See Comments)    REACTION: pt states INTOL to Trilipix w/ "thigh burning"  . Hydrocodone     Nightmare after taking cough syrup w/hydrocodone  . Adhesive (Tape) Rash  . Ceclor (Cefaclor) Rash  . Clarithromycin Rash  . Codeine Nausea Only  . Doxycycline Rash  . Lisinopril Cough    Developed ACE cough...  . Penicillins Itching and Rash    At injection site  . Tobradex Rash    Current Outpatient Prescriptions  Medication Sig Dispense Refill  . aspirin 81 MG tablet Take 81 mg by mouth daily.        . budesonide-formoterol (SYMBICORT) 80-4.5 MCG/ACT inhaler Inhale 2 puffs into the  lungs daily.        . Coenzyme Q10 (COQ10) 100 MG CAPS Take 1 capsule by mouth daily.        . fexofenadine (ALLEGRA) 180 MG tablet Take 180 mg by mouth daily.        . Glucosamine-Chondroit-Vit C-Mn (GLUCOSAMINE CHONDR 1500 COMPLX PO) Take 1 tablet by mouth 3 (three) times daily.        [name] losartan-hydrochlorothiazide (HYZAAR) 100-12.5 MG per tablet take 1 tablet by mouth once daily  30 tablet  12  . metoprolol (LOPRESSOR) 50 MG tablet take 1 tablet by mouth twice a day  60 tablet  10  . mometasone (NASONEX) 50 MCG/ACT nasal spray 2 sprays by Nasal route daily.        . nitroGLYCERIN (NITROSTAT) 0.4 MG SL tablet Take one tablet  By mouth  SL every 5 minutes for a total of 3 doses as needed for chest pain.  25 tablet  3  . Omega-3 Fatty Acids (FISH OIL) 1200 MG CAPS 2 tabs po bid       . omeprazole (PRILOSEC) 40 MG capsule take 1 capsule by mouth once daily 30 MINUTES BEFORE THE FIRST MEAL OF THE DAY  30 capsule  6  . rosuvastatin (CRESTOR) 10 MG tablet Take 1 tablet (10 mg total) by mouth daily. 1 tab po daily( except on Tuesday and Thursday no tabs )  and on Wednesday 1/2 tab daily  30 tablet  6  . traMADol (ULTRAM) 50 MG tablet Take 50 mg by mouth as needed. For pain      . travoprost, benzalkonium, (TRAVATAN) 0.004 % ophthalmic solution Place 1 drop into both eyes at bedtime.       . vitamin B-12 (CYANOCOBALAMIN) 1000 MCG tablet Take 1,000 mcg by mouth daily.        . Vitamin D, Ergocalciferol, (DRISDOL) 50000 UNITS CAPS Take 50,000 Units by mouth every 30 (thirty) days. Once a month        OBJECTIVE: Middle-aged white woman in no acute distress Filed Vitals:   05/08/11 1124  BP: 144/69  Pulse: 63  Temp: 97.6 F (36.4 C)     Body mass index is 26.25 kg/(m^2).    ECOG FS: 1  Sclerae unicteric Oropharynx clear No peripheral adenopathy Lungs no rales or rhonchi Heart regular rate and rhythm Abd benign MSK no focal spinal tenderness, no peripheral edema Neuro: nonfocal Breasts: The  right breast is unremarkable; left breast is status post lumpectomy and radiation. There is minimal dry desquamation in the inferior mammary fold only. There is erythema, as expected. There is no evidence of local recurrence  LAB RESULTS: Lab Results  Component Value Date   WBC 7.9 01/29/2011   NEUTROABS 2.5 12/26/2010   HGB 12.9 01/29/2011   HCT 38.3 01/29/2011   MCV 85.5 01/29/2011   PLT 264 01/29/2011      Chemistry      Component Value Date/Time   NA 130* 01/29/2011 1419   K 4.6 01/29/2011 1419   CL 93* 01/29/2011 1419   CO2 29 01/29/2011 1419   BUN 13 01/29/2011 1419   CREATININE 0.58 01/29/2011 1419      Component Value Date/Time   CALCIUM 9.3 01/29/2011 1419   ALKPHOS 84 12/26/2010 1215   AST 22 12/26/2010 1215   ALT 22 12/26/2010 1215   BILITOT 0.9 12/26/2010 1215       Lab Results  Component Value Date   LABCA2 16 12/26/2010    No components found with this basename: [id number]    No results found for this basename: INR:1;PROTIME:1 in the last 168 hours  Urinalysis    Component Value Date/Time   COLORURINE DK. ORANGE 12/07/2010 1043   APPEARANCEUR CLEAR 12/07/2010 1043   LABSPEC <=1.005 12/07/2010 1043   PHURINE 5.5 12/07/2010 1043   GLUCOSEU NEGATIVE 01/18/2010 0115   HGBUR MODERATE 12/07/2010 1043   BILIRUBINUR NEGATIVE 12/07/2010 1043   KETONESUR NEGATIVE 12/07/2010 1043   PROTEINUR NEGATIVE 01/18/2010 0115   UROBILINOGEN 1.0 12/07/2010 1043   NITRITE POSITIVE 12/07/2010 1043   LEUKOCYTESUR MODERATE 12/07/2010 1043       STUDIES: No new results found.  ASSESSMENT: 76 year old [place] woman status post left lumpectomy and sentinel lymph node biopsy December of 2012 for a T2 N0, stage IIA invasive ductal carcinoma, grade 3, HER-2 not amplified, strongly  estrogen and progesterone receptor positive with an elevated proliferation marker at 72%; with an Oncotype recurrence score of 21, in the intermediate range, indicating a 13% and distant disease  recurrence rate of 13% within 10 years if the patient took tamoxifen for 5 years. She completed radiation  March of 2011 and is now ready to start anti-estrogen treatment.  PLAN: Given her comorbid conditions I would prefer to treat her with an aromatase inhibitor as opposed to tamoxifen. We would over the possible side effects toxicities as well as benefits of the medication and I wrote her a prescription for letrozole. She will call us if she has any unusual side effects, otherwise she will return to see [place] in 3 months. If she is tolerating the letrozole well by that time we will start seeing her on an every six-month basis  She had a bone density last year. We will repeat that 2 years from that study. She is a radiology vitamin D supplementation. I have recommended she start calcium twice daily.  She knows to call for any problems that may develop before the next visit   Kacper Cartlidge C    05/08/2011

## 2011-05-10 DIAGNOSIS — J309 Allergic rhinitis, unspecified: Secondary | ICD-10-CM | POA: Diagnosis not present

## 2011-05-16 DIAGNOSIS — J309 Allergic rhinitis, unspecified: Secondary | ICD-10-CM | POA: Diagnosis not present

## 2011-05-17 ENCOUNTER — Ambulatory Visit (INDEPENDENT_AMBULATORY_CARE_PROVIDER_SITE_OTHER): Payer: Medicare Other | Admitting: Surgery

## 2011-05-17 ENCOUNTER — Encounter (INDEPENDENT_AMBULATORY_CARE_PROVIDER_SITE_OTHER): Payer: Self-pay | Admitting: Surgery

## 2011-05-17 VITALS — BP 140/84 | HR 64 | Resp 16 | Ht 59.0 in | Wt 133.0 lb

## 2011-05-17 DIAGNOSIS — Z853 Personal history of malignant neoplasm of breast: Secondary | ICD-10-CM

## 2011-05-17 NOTE — Progress Notes (Signed)
NAME: [name]       DOB: 1936/02/19           DATE: 05/17/2011       MRN: [id number]   [name] is a 76 y.o.Marland Kitchenfemale who presents for routine followup of her Left breast cancer, lower outer quadrant diagnosed in 2012 and treated with Lumpectomy, radiation and now letrozole. She has no problems or concerns on either side.  PFSH: She has had no significant changes since the last visit here.  ROS: There have been no significant changes since the last visit here  EXAM: General: The patient is alert, oriented, generally healty appearing, NAD. Mood and affect are normal.  Breasts:  Right is normal. Left shows changes of recent radiation with some edema and darkening of the skin. The lumpectomy site is soft.  Lymphatics: She has no axillary or supraclavicular adenopathy on either side.  Extremities: Full ROM of the surgical side with no lymphedema noted.  Data Reviewed: No new data  Impression: Doing well, with no evidence of recurrent cancer or new cancer  Plan: RTC 6 months

## 2011-05-17 NOTE — Patient Instructions (Signed)
See me again in about six months

## 2011-05-21 ENCOUNTER — Other Ambulatory Visit: Payer: Self-pay | Admitting: Pharmacist

## 2011-05-21 DIAGNOSIS — M545 Low back pain: Secondary | ICD-10-CM | POA: Diagnosis not present

## 2011-05-21 DIAGNOSIS — M5030 Other cervical disc degeneration, unspecified cervical region: Secondary | ICD-10-CM | POA: Diagnosis not present

## 2011-05-21 DIAGNOSIS — M9981 Other biomechanical lesions of cervical region: Secondary | ICD-10-CM | POA: Diagnosis not present

## 2011-05-21 DIAGNOSIS — E785 Hyperlipidemia, unspecified: Secondary | ICD-10-CM

## 2011-05-21 NOTE — Progress Notes (Signed)
CC:   [name], M.D. [name], M.D. [name]. [name], MD  DIAGNOSIS:  Left breast cancer.  INDICATION FOR THERAPY:  Breast conservation.  TREATMENT DATES:  March 19, 2011, through May 06, 2011.  SITE/DOSE:  Left breast/5040 cGy in 28 fractions (180 cGy per fraction). The site of presentation in the lower outer quadrant of the left breast was boosted to a cumulative dose of 6440 cGy.  ENERGY/FIELDS:  The patient was initially treated with tangential beams encompassing the left breast.  Forward planning was used to improve the dose homogeneity.  6 MV photons were used to deliver the patient's treatment.  After 28 treatments, the patient underwent therapy directed at the site of presentation in the lower outer quadrant of the left breast.  The patient was treated with an AP and posterior oblique field using 6 MV photons.  The patient received 7 additional treatments at 200 cGy per day for an additional dose of 1400 cGy and a cumulative dose to the target area of 6440 cGy.  NARRATIVE:  [name] tolerated her treatments reasonably well. Towards the end of her therapy, she did develop some pruritus and discomfort in the breast area but did not experience any moist desquamation during the course of her therapy.  FOLLOWUP APPOINTMENT:  1 month.    ______________________________ Billie Lade, Ph.D., M.D. JDK/MEDQ  D:  05/21/2011  T:  05/21/2011  Job:  [id number]

## 2011-05-23 DIAGNOSIS — J309 Allergic rhinitis, unspecified: Secondary | ICD-10-CM | POA: Diagnosis not present

## 2011-05-27 ENCOUNTER — Encounter (INDEPENDENT_AMBULATORY_CARE_PROVIDER_SITE_OTHER): Payer: Self-pay | Admitting: Surgery

## 2011-05-28 DIAGNOSIS — J309 Allergic rhinitis, unspecified: Secondary | ICD-10-CM | POA: Diagnosis not present

## 2011-06-04 ENCOUNTER — Other Ambulatory Visit: Payer: Self-pay | Admitting: Gastroenterology

## 2011-06-04 DIAGNOSIS — R51 Headache: Secondary | ICD-10-CM | POA: Diagnosis not present

## 2011-06-04 DIAGNOSIS — M545 Low back pain: Secondary | ICD-10-CM | POA: Diagnosis not present

## 2011-06-04 DIAGNOSIS — M9981 Other biomechanical lesions of cervical region: Secondary | ICD-10-CM | POA: Diagnosis not present

## 2011-06-04 DIAGNOSIS — M5030 Other cervical disc degeneration, unspecified cervical region: Secondary | ICD-10-CM | POA: Diagnosis not present

## 2011-06-05 DIAGNOSIS — J309 Allergic rhinitis, unspecified: Secondary | ICD-10-CM | POA: Diagnosis not present

## 2011-06-10 ENCOUNTER — Other Ambulatory Visit: Payer: Medicare Other

## 2011-06-10 ENCOUNTER — Encounter: Payer: Self-pay | Admitting: Radiation Oncology

## 2011-06-10 ENCOUNTER — Other Ambulatory Visit (INDEPENDENT_AMBULATORY_CARE_PROVIDER_SITE_OTHER): Payer: 59

## 2011-06-10 ENCOUNTER — Ambulatory Visit
Admission: RE | Admit: 2011-06-10 | Discharge: 2011-06-10 | Disposition: A | Discharge status: Home / Self Care | Payer: Medicare Other | Source: Ambulatory Visit | Attending: Radiation Oncology | Admitting: Radiation Oncology

## 2011-06-10 VITALS — BP 144/81 | HR 65 | Temp 98.0°F | Wt 134.4 lb

## 2011-06-10 DIAGNOSIS — C50519 Malignant neoplasm of lower-outer quadrant of unspecified female breast: Secondary | ICD-10-CM

## 2011-06-10 DIAGNOSIS — E785 Hyperlipidemia, unspecified: Secondary | ICD-10-CM

## 2011-06-10 LAB — LIPID PANEL
Cholesterol: 193 mg/dL (ref 0–200)
LDL Cholesterol: 101 mg/dL — ABNORMAL HIGH (ref 0–99)
Triglycerides: 176 mg/dL — ABNORMAL HIGH (ref 0.0–149.0)

## 2011-06-10 LAB — HEPATIC FUNCTION PANEL
ALT: 30 U/L (ref 0–35)
AST: 22 U/L (ref 0–37)
Albumin: 4.1 g/dL (ref 3.5–5.2)
Alkaline Phosphatase: 63 U/L (ref 39–117)
Bilirubin, Direct: 0.1 mg/dL (ref 0.0–0.3)
Total Bilirubin: 0.7 mg/dL (ref 0.3–1.2)
Total Protein: 6.9 g/dL (ref 6.0–8.3)

## 2011-06-10 NOTE — Progress Notes (Signed)
Here for routine one month follow up post radiation of left breast. Patient is doing well.Back to work 3 days a weeks.Walking approximately 4 days weekly. Intermittent fatigue is tolerable. Skin looks great. No peeling and not much change in skin tone.

## 2011-06-10 NOTE — Progress Notes (Signed)
CC:   [name], M.D. Lowella Dell, M.D.  DIAGNOSIS:  Left breast cancer.  NARRATIVE:  [name] comes in today for routine 38-month followup. She clinically seems to be doing well at this time.  The patient has started the hormonal therapy and seems to be tolerating this well thus far.  She is taking letrozole.  The patient denies any nipple discharge or bleeding.  EXAMINATION:  The patient's temperature is 98.0, pulse 65, blood pressure is 144/81.  Weight is 134 pounds.  Examination of the neck and supraclavicular region reveals no evidence of adenopathy.  The axillary areas are free of adenopathy.  Examination of the lungs reveals them to be clear.  The heart has regular rhythm and rate.  Examination of the right breast reveals no mass or nipple discharge.  Examination of the left breast reveals some hyperpigmentation changes.  There is some edema in the nipple-areolar complex area.  There is some "lumpiness" to the central breast area but no discrete mass is palpable.  There is no nipple discharge or bleeding noted.  IMPRESSION/PLAN:  Clinically NED.  The patient return for routine followup in 6 months and in the interim will see Dr. [name] and Dr. Darnelle Catalan.    ______________________________ Billie Lade, Ph.D., M.D. JDK/MEDQ  D:  06/10/2011  T:  06/10/2011  Job:  [id number]

## 2011-06-11 DIAGNOSIS — J309 Allergic rhinitis, unspecified: Secondary | ICD-10-CM | POA: Diagnosis not present

## 2011-06-13 ENCOUNTER — Ambulatory Visit (INDEPENDENT_AMBULATORY_CARE_PROVIDER_SITE_OTHER): Payer: Medicare Other | Admitting: Pharmacist

## 2011-06-13 VITALS — BP 128/60 | Wt 134.0 lb

## 2011-06-13 DIAGNOSIS — E785 Hyperlipidemia, unspecified: Secondary | ICD-10-CM

## 2011-06-13 NOTE — Patient Instructions (Signed)
1. Continue taking Crestor 10mg  on Monday & Friday, 5mg  on Wednesday 2. Continue taking fish oil (look for 1-2gm of EPA+DHA on the label) 3. Continue to maintain a heart-healthy diet full of fresh fruits & vegetables 4. Continue to walk for at least 150 minutes/week 5. Return for follow-up in 6 months (we'll call you to make an appointment)

## 2011-06-13 NOTE — Progress Notes (Signed)
Subjective- [name] presented to the lipid clinic today in good spirits for her 6 month follow up.  She continues on Crestor 10 mg on Mondays and Fridays and 5 mg on Wednesdays, along with fish oil 2400 mg twice daily, without complaints of side effects. She has a history of significant intolerance to multiple statins and other lipid therapies, and her current dosing is the result of attempted titration.  Of note, since her last visit, she was diagnosed with breast cancer and successfully completed radiation therapy.  Diet- She continues to maintain a heart-healthy diet with lots of fruits & vegetables.  Her challenge continues to be her love of fried fish, which she may have once per week, though she has tried to switch to broiled fish instead.  Exercise- Since her cancer diagnosis & treatment, she has had less energy and has not been as active, though she continues to walk 4-5 x/week for 30 minutes.  Social History- Patient denies alcohol or tobacco (past or present) use.  Lipid Panel     Component Value Date/Time   CHOL 193 06/10/2011 0749   TRIG 176.0* 06/10/2011 0749   HDL 57.30 06/10/2011 0749   CHOLHDL 3 06/10/2011 0749   VLDL 35.2 06/10/2011 0749   LDLCALC 101* 06/10/2011 0749    Current Outpatient Prescriptions on File Prior to Visit  Medication Sig Dispense Refill  . aspirin 81 MG tablet Take 81 mg by mouth daily.        . budesonide-formoterol (SYMBICORT) 80-4.5 MCG/ACT inhaler Inhale 2 puffs into the lungs daily.        . Coenzyme Q10 (COQ10) 100 MG CAPS Take 1 capsule by mouth daily.        . fexofenadine (ALLEGRA) 180 MG tablet Take 180 mg by mouth daily.        . Glucosamine-Chondroit-Vit C-Mn (GLUCOSAMINE CHONDR 1500 COMPLX PO) Take 1 tablet by mouth 3 (three) times daily.        [name] letrozole (FEMARA) 2.5 MG tablet Take 2.5 mg by mouth daily.      [name] losartan-hydrochlorothiazide (HYZAAR) 100-12.5 MG per tablet take 1 tablet by mouth once daily  30 tablet  12  . metoprolol  (LOPRESSOR) 50 MG tablet take 1 tablet by mouth twice a day  60 tablet  10  . mometasone (NASONEX) 50 MCG/ACT nasal spray 2 sprays by Nasal route daily.        . Omega-3 Fatty Acids (FISH OIL) 1200 MG CAPS 2 tabs po bid       . omeprazole (PRILOSEC) 40 MG capsule take 1 capsule by mouth once daily 30 MINUTES BEFORE THE FIRST MEAL OF THE DAY  30 capsule  6  . rosuvastatin (CRESTOR) 10 MG tablet Take 1 tablet (10 mg total) by mouth daily. 1 tab po daily( except on Tuesday and Thursday no tabs )  and on Wednesday 1/2 tab daily  30 tablet  6  . traMADol (ULTRAM) 50 MG tablet Take 50 mg by mouth as needed. For pain      . travoprost, benzalkonium, (TRAVATAN) 0.004 % ophthalmic solution Place 1 drop into both eyes at bedtime.       . vitamin B-12 (CYANOCOBALAMIN) 1000 MCG tablet Take 1,000 mcg by mouth daily.        . Vitamin D, Ergocalciferol, (DRISDOL) 50000 UNITS CAPS Take 50,000 Units by mouth every 30 (thirty) days. Once a month      . nitroGLYCERIN (NITROSTAT) 0.4 MG SL tablet Take one tablet  By mouth SL every 5 minutes for a total of 3 doses as needed for chest pain.  25 tablet  3  . DISCONTD: budesonide-formoterol (SYMBICORT) 80-4.5 MCG/ACT inhaler Inhale 2 puffs into the lungs 2 (two) times daily.  1 Inhaler  12

## 2011-06-13 NOTE — Assessment & Plan Note (Addendum)
CV Risk Assessment- Risk Factors: CAD, age, HTN Positive Factors: high HDL TC goal <200, HDL goal >45, LDL goal <70, TG goal <150  Her TG are improved, but her LDL has come back up a bit, though this is likely related to her dealing with her cancer diagnosis.  Recommendations/Plan- 1. Continue taking Crestor 10mg  on Monday & Friday, 5mg  on Wednesday 2. Continue taking fish oil (look for 1-2gm of EPA+DHA on the label) 3. Continue to maintain a heart-healthy diet full of fresh fruits & vegetables 4. Continue to walk for at least 150 minutes/week 5. Return for follow-up in 6 months (we'll call you to make an appointment)

## 2011-06-18 DIAGNOSIS — M5030 Other cervical disc degeneration, unspecified cervical region: Secondary | ICD-10-CM | POA: Diagnosis not present

## 2011-06-18 DIAGNOSIS — M5412 Radiculopathy, cervical region: Secondary | ICD-10-CM | POA: Diagnosis not present

## 2011-06-18 DIAGNOSIS — M9981 Other biomechanical lesions of cervical region: Secondary | ICD-10-CM | POA: Diagnosis not present

## 2011-06-19 ENCOUNTER — Telehealth: Payer: Self-pay | Admitting: Pulmonary Disease

## 2011-06-19 ENCOUNTER — Other Ambulatory Visit: Payer: Medicare Other

## 2011-06-19 DIAGNOSIS — J309 Allergic rhinitis, unspecified: Secondary | ICD-10-CM | POA: Diagnosis not present

## 2011-06-19 DIAGNOSIS — R300 Dysuria: Secondary | ICD-10-CM | POA: Diagnosis not present

## 2011-06-19 LAB — URINALYSIS, ROUTINE W REFLEX MICROSCOPIC
Bilirubin Urine: NEGATIVE
Nitrite: POSITIVE
Total Protein, Urine: NEGATIVE
pH: 6 (ref 5.0–8.0)

## 2011-06-19 NOTE — Telephone Encounter (Signed)
Pt c/o bladder pressure, increased frequency and some burning.  Started AZO yesterday but not helping.  Pt wants to come leave UA.  Please advise is ok to send order.

## 2011-06-19 NOTE — Telephone Encounter (Signed)
Per SN---ok for pt to have ua and  C&S done please. thanks

## 2011-06-19 NOTE — Telephone Encounter (Signed)
Order has been placed and pt is aware. Nothing further was needed 

## 2011-06-20 ENCOUNTER — Telehealth: Payer: Self-pay

## 2011-06-20 MED ORDER — CIPROFLOXACIN HCL 250 MG PO TABS: 250.0000 mg | ORAL_TABLET | Freq: Two times a day (BID) | ORAL | Status: AC

## 2011-06-20 NOTE — Telephone Encounter (Signed)
Called and spoke with pt about her ua results per SN.  She is aware that UA showed +UTI and recs to strart on cipro 250mg   #14  1 po bid until gone.  Pt is aware that this has been sent to her pharmacy and we will call if anything else further is needed.  Pt voiced her understanding of these results.

## 2011-06-21 ENCOUNTER — Other Ambulatory Visit: Payer: Self-pay | Admitting: Cardiovascular Disease

## 2011-06-21 NOTE — Telephone Encounter (Signed)
Refilled generic hyzaar

## 2011-06-24 LAB — URINE CULTURE

## 2011-06-25 DIAGNOSIS — J309 Allergic rhinitis, unspecified: Secondary | ICD-10-CM | POA: Diagnosis not present

## 2011-07-01 ENCOUNTER — Other Ambulatory Visit: Payer: Self-pay | Admitting: Cardiovascular Disease

## 2011-07-02 DIAGNOSIS — M5030 Other cervical disc degeneration, unspecified cervical region: Secondary | ICD-10-CM | POA: Diagnosis not present

## 2011-07-02 DIAGNOSIS — M5412 Radiculopathy, cervical region: Secondary | ICD-10-CM | POA: Diagnosis not present

## 2011-07-02 DIAGNOSIS — M9981 Other biomechanical lesions of cervical region: Secondary | ICD-10-CM | POA: Diagnosis not present

## 2011-07-04 ENCOUNTER — Ambulatory Visit: Payer: 59 | Admitting: Cardiovascular Disease

## 2011-07-06 ENCOUNTER — Encounter (HOSPITAL_BASED_OUTPATIENT_CLINIC_OR_DEPARTMENT_OTHER): Payer: Self-pay | Admitting: *Deleted

## 2011-07-06 ENCOUNTER — Emergency Department (HOSPITAL_BASED_OUTPATIENT_CLINIC_OR_DEPARTMENT_OTHER)
Admission: EM | Admit: 2011-07-06 | Discharge: 2011-07-06 | Disposition: A | Discharge status: Home / Self Care | Payer: Medicare Other | Attending: Emergency Medicine | Admitting: Emergency Medicine

## 2011-07-06 ENCOUNTER — Emergency Department (INDEPENDENT_AMBULATORY_CARE_PROVIDER_SITE_OTHER): Payer: Medicare Other

## 2011-07-06 DIAGNOSIS — I2510 Atherosclerotic heart disease of native coronary artery without angina pectoris: Secondary | ICD-10-CM | POA: Insufficient documentation

## 2011-07-06 DIAGNOSIS — R059 Cough, unspecified: Secondary | ICD-10-CM | POA: Diagnosis not present

## 2011-07-06 DIAGNOSIS — K589 Irritable bowel syndrome without diarrhea: Secondary | ICD-10-CM | POA: Insufficient documentation

## 2011-07-06 DIAGNOSIS — R05 Cough: Secondary | ICD-10-CM

## 2011-07-06 DIAGNOSIS — R0989 Other specified symptoms and signs involving the circulatory and respiratory systems: Secondary | ICD-10-CM | POA: Insufficient documentation

## 2011-07-06 DIAGNOSIS — J3489 Other specified disorders of nose and nasal sinuses: Secondary | ICD-10-CM | POA: Insufficient documentation

## 2011-07-06 DIAGNOSIS — Z853 Personal history of malignant neoplasm of breast: Secondary | ICD-10-CM | POA: Insufficient documentation

## 2011-07-06 DIAGNOSIS — I10 Essential (primary) hypertension: Secondary | ICD-10-CM | POA: Diagnosis not present

## 2011-07-06 DIAGNOSIS — J40 Bronchitis, not specified as acute or chronic: Secondary | ICD-10-CM | POA: Insufficient documentation

## 2011-07-06 DIAGNOSIS — R0789 Other chest pain: Secondary | ICD-10-CM | POA: Diagnosis not present

## 2011-07-06 DIAGNOSIS — K219 Gastro-esophageal reflux disease without esophagitis: Secondary | ICD-10-CM | POA: Insufficient documentation

## 2011-07-06 DIAGNOSIS — Z79899 Other long term (current) drug therapy: Secondary | ICD-10-CM | POA: Insufficient documentation

## 2011-07-06 DIAGNOSIS — H409 Unspecified glaucoma: Secondary | ICD-10-CM | POA: Insufficient documentation

## 2011-07-06 DIAGNOSIS — Z7982 Long term (current) use of aspirin: Secondary | ICD-10-CM | POA: Diagnosis not present

## 2011-07-06 DIAGNOSIS — J45909 Unspecified asthma, uncomplicated: Secondary | ICD-10-CM | POA: Insufficient documentation

## 2011-07-06 DIAGNOSIS — E785 Hyperlipidemia, unspecified: Secondary | ICD-10-CM | POA: Diagnosis not present

## 2011-07-06 DIAGNOSIS — J209 Acute bronchitis, unspecified: Secondary | ICD-10-CM | POA: Diagnosis not present

## 2011-07-06 MED ORDER — ALBUTEROL SULFATE HFA 108 (90 BASE) MCG/ACT IN AERS
2.0000 | INHALATION_SPRAY | RESPIRATORY_TRACT | Status: DC | PRN
  Administered 2011-07-06: 2 via RESPIRATORY_TRACT
  Filled 2011-07-06: qty 6.7

## 2011-07-06 MED ORDER — SULFAMETHOXAZOLE-TRIMETHOPRIM 800-160 MG PO TABS: 1.0000 | ORAL_TABLET | Freq: Two times a day (BID) | ORAL | Status: DC

## 2011-07-06 MED ORDER — ALBUTEROL SULFATE (5 MG/ML) 0.5% IN NEBU
5.0000 mg | INHALATION_SOLUTION | Freq: Once | RESPIRATORY_TRACT | Status: AC
  Administered 2011-07-06: 5 mg via RESPIRATORY_TRACT
  Filled 2011-07-06: qty 1

## 2011-07-06 NOTE — ED Notes (Signed)
Cough, congestion since yesterday 

## 2011-07-06 NOTE — ED Provider Notes (Addendum)
History   This chart was scribed for Hilario Quarry, MD by Shari Heritage. The patient was seen in room MH04/MH04. Patient's care was started at 1550.     CSN: [id number]  Arrival date & time 07/06/11  1550   First MD Initiated Contact with Patient 07/06/11 1613      Chief Complaint  Patient presents with  . Cough    (Consider location/radiation/quality/duration/timing/severity/associated sxs/prior treatment) HPI [name] is a 76 y.o. female who presents to the Emergency Department complaining of moderate to severe, persistent productive cough associated with chest congestion. Patient reports that the cough produces clear phlegm. Patient with h/o of CABG in 2011 and a left lumpectomy in 2012. Patient has undergone radiation treatments. Patient with h/o of cholecystectomy, appendectomy, coronary angioplasty, HTN, hypercholesteremia, breast cancer, and CAD. Patient denies chest pain, abdominal pain, neck pain, HA, edema, fever or chills. Patient has never smoked.  Past Medical History  Diagnosis Date  . Unspecified glaucoma   . Allergic rhinitis, cause unspecified   . Unspecified asthma   . Unspecified essential hypertension   . Other and unspecified hyperlipidemia   . Gastritis   . Abdominal pain, left lower quadrant   . Diverticulosis of colon (without mention of hemorrhage)   . Irritable bowel syndrome   . Colonic polyp   . Headache   . Dizziness   . Breast cancer, Left 12/20/2010  . Gallstones   . CAD (coronary artery disease)     Dr. Eden Emms, saw last in Oct 2012  . Bronchitis     hx of  . Pneumonia   . Hiatal hernia   . Fibromyalgia   . Esophageal reflux     hiatal hernia    Past Surgical History  Procedure Date  . Coronary angioplasty with stent placement     Stent 2007  . Open heart surgery     01/19/2010  . Coronary artery bypass graft   . Eye surgery     bilateral cataract removal,  . Breast mass excision 02/06/11    left  . Left lumpectomy  02/06/2011    Family History  Problem Relation Age of Onset  . Heart disease Brother   . Cancer Brother   . Diabetes Sister     History  Substance Use Topics  . Smoking status: Never Smoker   . Smokeless tobacco: Never Used  . Alcohol Use: No    OB History    Grav Para Term Preterm Abortions TAB SAB Ect Mult Living                  Review of Systems  Constitutional: Negative for fever and chills.  HENT: Positive for congestion. Negative for neck pain.   Respiratory: Positive for cough.   Gastrointestinal: Negative for abdominal pain.  Neurological: Negative for headaches.  All other systems reviewed and are negative.    Allergies  Choline fenofibrate; Simvastatin; Hydrocodone; Adhesive; Ceclor; Clarithromycin; Codeine; Doxycycline; Lisinopril; Penicillins; and Tobramycin-dexamethasone  Home Medications   Current Outpatient Rx  Name Route Sig Dispense Refill  . ASPIRIN 81 MG PO TABS Oral Take 81 mg by mouth daily.      . COQ10 100 MG PO CAPS Oral Take 1 capsule by mouth daily.      [name] FEXOFENADINE HCL 180 MG PO TABS Oral Take 180 mg by mouth daily.      [name] GLUCOSAMINE CHONDR 1500 COMPLX PO Oral Take 1 tablet by mouth 3 (three) times daily.      [name]  LETROZOLE 2.5 MG PO TABS Oral Take 2.5 mg by mouth daily.    [name] LOSARTAN POTASSIUM-HCTZ 100-12.5 MG PO TABS  take 1 tablet by mouth once daily 30 tablet 12  . METOPROLOL TARTRATE 50 MG PO TABS  take 1 tablet by mouth twice a day 60 tablet 6    Refill Approved  . MOMETASONE FUROATE 50 MCG/ACT NA SUSP Nasal 2 sprays by Nasal route daily.      [name] FISH OIL 1200 MG PO CAPS  2 tabs po bid     . OMEPRAZOLE 40 MG PO CPDR  take 1 capsule by mouth once daily 30 MINUTES BEFORE THE FIRST MEAL OF THE DAY 30 capsule 6  . ROSUVASTATIN CALCIUM 10 MG PO TABS Oral Take 1 tablet (10 mg total) by mouth daily. 1 tab po daily( except on Tuesday and Thursday no tabs )  and on Wednesday 1/2 tab daily 30 tablet 6  . TRAMADOL HCL 50 MG PO TABS Oral Take  50 mg by mouth as needed. For pain    . TRAVOPROST 0.004 % OP SOLN Both Eyes Place 1 drop into both eyes at bedtime.     [name] VITAMIN B-12 1000 MCG PO TABS Oral Take 1,000 mcg by mouth daily.      [name] VITAMIN D (ERGOCALCIFEROL) 50000 UNITS PO CAPS Oral Take 50,000 Units by mouth every 30 (thirty) days. Once a month    . BUDESONIDE-FORMOTEROL FUMARATE 80-4.5 MCG/ACT IN AERO Inhalation Inhale 2 puffs into the lungs daily.      [name] NITROGLYCERIN 0.4 MG SL SUBL  Take one tablet  By mouth SL every 5 minutes for a total of 3 doses as needed for chest pain. 25 tablet 3    BP 167/56  Pulse 62  Temp(Src) 98.1 F (36.7 C) (Oral)  Resp 20  Ht 4\' 11"  (1.499 m)  Wt 133 lb (60.328 kg)  BMI 26.86 kg/m2  SpO2 96%  Physical Exam  Nursing note and vitals reviewed. Constitutional: She is oriented to person, place, and time. She appears well-developed and well-nourished.  HENT:  Head: Normocephalic and atraumatic.  Eyes: Conjunctivae and EOM are normal. Pupils are equal, round, and reactive to light.  Neck: Normal range of motion. Neck supple.  Cardiovascular: Normal rate and regular rhythm.   Pulmonary/Chest: Effort normal and breath sounds normal.  Abdominal: Soft. Bowel sounds are normal.  Musculoskeletal: Normal range of motion.  Neurological: She is alert and oriented to person, place, and time.  Skin: Skin is warm and dry.  Psychiatric: She has a normal mood and affect.    ED Course  Procedures (including critical care time) DIAGNOSTIC STUDIES: Oxygen Saturation is 100% on room air, normal by my interpretation.    COORDINATION OF CARE: 4:13PM- Patient informed of current plan for treatment and evaluation and agrees with plan at this time. Will order chest x-Averee Harb.   Labs Reviewed - No data to display Dg Chest 2 View  07/06/2011  *RADIOLOGY REPORT*  Clinical Data: Cough, congestion and chest tightness.  CHEST - 2 VIEW  Comparison: And lateral chest 02/06/2011  Findings: The patient is status  post CABG.  Heart size is normal. Lungs are clear.  No pneumothorax or pleural fluid.  IMPRESSION: No acute disease.  Original Report Authenticated By: Bernadene Bell. Maricela Curet, M.D.     No diagnosis found.    MDM    Patient with no pneumonia noted on chest x-Lyda Colcord. She is advised to follow primary care doctor as needed.  She is not dyspneic here has normal oxygen saturation and normal vital signs. I personally performed the services described in this documentation, which was scribed in my presence. The recorded information has been reviewed and considered.      Hilario Quarry, MD 07/07/11 1610  Hilario Quarry, MD 07/07/11 2308

## 2011-07-06 NOTE — Discharge Instructions (Signed)
Bronchitis Bronchitis is a problem of the air tubes leading to your lungs. This problem makes it hard for air to get in and out of the lungs. You may cough a lot because your air tubes are narrow. Going without care can cause lasting (chronic) bronchitis. HOME CARE   Drink enough fluids to keep your pee (urine) clear or pale yellow.   Use a cool mist humidifier.   Quit smoking if you smoke. If you keep smoking, the bronchitis might not get better.   Only take medicine as told by your doctor.  GET HELP RIGHT AWAY IF:   Coughing keeps you awake.   You start to wheeze.   You become more sick or weak.   You have a hard time breathing or get short of breath.   You cough up blood.   Coughing lasts more than 2 weeks.   You have a fever.   Your baby is older than 3 months with a rectal temperature of 102 F (38.9 C) or higher.   Your baby is 3 months old or younger with a rectal temperature of 100.4 F (38 C) or higher.  MAKE SURE YOU:  Understand these instructions.   Will watch your condition.   Will get help right away if you are not doing well or get worse.  Document Released: 07/31/2007 Document Revised: 01/31/2011 Document Reviewed: 01/13/2009 [employer] Patient Information 2012 [employer]. 

## 2011-07-08 ENCOUNTER — Telehealth: Payer: Self-pay | Admitting: Pulmonary Disease

## 2011-07-08 MED ORDER — LEVOFLOXACIN 500 MG PO TABS: 500.0000 mg | ORAL_TABLET | Freq: Every day | ORAL | Status: AC

## 2011-07-08 NOTE — Telephone Encounter (Signed)
Per SN---ok to send in levaqui 500mg   #7  1 daily.  Pt is aware that this has been sent to her pharmacy and nothing further needed.

## 2011-07-08 NOTE — Telephone Encounter (Signed)
I spoke with pt and she c/o chest congestion, cough w/ clear to yellow phlem, and is blowing out yellow phlem x Friday/ denies any f/c/s/n/v/sore throat. Pt went to [facility] ED on Saturday and was only given an albuterol inhaler to use. She has been using her nasonex and taking her allegra. Pt is requesting an abx be called in for her to help clear her up. Please advise SN thanks  Allergies  Allergen Reactions  . Choline Fenofibrate Other (See Comments)    REACTION: pt states INTOL to Trilipix w/ "thigh burning"  . Simvastatin Other (See Comments)    REACTION: pt states INTOL to STATINS \\T \ refuses to restart  . Hydrocodone     Nightmare after taking cough syrup w/hydrocodone  . Adhesive (Tape) Rash  . Ceclor (Cefaclor) Rash  . Clarithromycin Rash  . Codeine Nausea Only  . Doxycycline Rash  . Lisinopril Cough    Developed ACE cough...  . Penicillins Itching and Rash    At injection site  . Tobramycin-Dexamethasone Rash

## 2011-07-09 ENCOUNTER — Ambulatory Visit: Payer: 59 | Admitting: Nurse Practitioner

## 2011-07-15 ENCOUNTER — Encounter: Payer: Self-pay | Admitting: Nurse Practitioner

## 2011-07-15 ENCOUNTER — Ambulatory Visit (INDEPENDENT_AMBULATORY_CARE_PROVIDER_SITE_OTHER): Payer: Medicare Other | Admitting: Nurse Practitioner

## 2011-07-15 VITALS — BP 128/70 | HR 78 | Ht 59.0 in | Wt 136.0 lb

## 2011-07-15 DIAGNOSIS — E785 Hyperlipidemia, unspecified: Secondary | ICD-10-CM

## 2011-07-15 DIAGNOSIS — I10 Essential (primary) hypertension: Secondary | ICD-10-CM | POA: Diagnosis not present

## 2011-07-15 DIAGNOSIS — R072 Precordial pain: Secondary | ICD-10-CM | POA: Diagnosis not present

## 2011-07-15 DIAGNOSIS — I2510 Atherosclerotic heart disease of native coronary artery without angina pectoris: Secondary | ICD-10-CM

## 2011-07-15 DIAGNOSIS — R0789 Other chest pain: Secondary | ICD-10-CM

## 2011-07-15 DIAGNOSIS — R079 Chest pain, unspecified: Secondary | ICD-10-CM

## 2011-07-15 NOTE — Patient Instructions (Signed)
Your physician recommends that you schedule a follow-up appointment in: 2 weeks with Ward Givens, NP (on a day Dr Eden Emms is in the office) Your physician has requested that you have a lexiscan myoview. For further information please visit [email or web address] Please follow instruction sheet, as given.

## 2011-07-15 NOTE — Progress Notes (Signed)
Patient Name: [name] Date of Encounter: 07/15/2011  Primary Care Provider:  Michele Mcalpine, MD, MD Primary Cardiologist:  P. Eden Emms, MD  Patient Profile  76 y/o female with history of CAD who presents for f/u.  Problem List   Past Medical History  Diagnosis Date  . Unspecified glaucoma   . Allergic rhinitis, cause unspecified   . Unspecified asthma   . Unspecified essential hypertension   . Other and unspecified hyperlipidemia   . Gastritis   . Abdominal pain, left lower quadrant   . Diverticulosis of colon (without mention of hemorrhage)   . Irritable bowel syndrome   . Colonic polyp   . Headache   . Dizziness   . Breast cancer, Left 12/20/2010  . Gallstones   . CAD (coronary artery disease)     a. 01/19/2010 s/p CABG x 3, lima->lad, vg->diag, vg->om1  . Bronchitis     hx of  . Pneumonia   . Hiatal hernia   . Fibromyalgia   . Esophageal reflux     hiatal hernia   Past Surgical History  Procedure Date  . Coronary angioplasty with stent placement     Stent 2007  . Open heart surgery     01/19/2010  . Coronary artery bypass graft   . Eye surgery     bilateral cataract removal,  . Breast mass excision 02/06/11    left  . Left lumpectomy 02/06/2011    Allergies  Allergies  Allergen Reactions  . Choline Fenofibrate Other (See Comments)    REACTION: pt states INTOL to Trilipix w/ "thigh burning"  . Simvastatin Other (See Comments)    REACTION: pt states INTOL to STATINS \\T \ refuses to restart  . Hydrocodone     Nightmare after taking cough syrup w/hydrocodone  . Adhesive (Tape) Rash  . Ceclor (Cefaclor) Rash  . Clarithromycin Rash  . Codeine Nausea Only  . Doxycycline Rash  . Lisinopril Cough    Developed ACE cough...  . Penicillins Itching and Rash    At injection site  . Tobramycin-Dexamethasone Rash    HPI  76 y/o female with the above problem list.  She was last seen by Dr. Eden Emms in Nov. 2012.  She had been doing well until  about 3 wks ago when she began to experience productive cough.  At the same time, she began to note dyspnea and mild chest tightness, occurring occasionally when walking up hills.  This would not occur every time that she walked up a hill and has not occurred at all while walking on a flat surface.  She isn't sure if Ss are related to her cough/bronchitis (for which she is now on levaquin) but does not that the tightness is somewhat similar to what she experienced prior to her stenting/cabg.  She denies pnd, orthopnea, n, v, dizziness, syncope, edema, or early satiety.  Home Medications  Prior to Admission medications   Medication Sig Start Date End Date Taking? Authorizing Provider  aspirin 81 MG tablet Take 81 mg by mouth daily.     Yes Historical Provider, MD  budesonide-formoterol (SYMBICORT) 80-4.5 MCG/ACT inhaler Inhale 2 puffs into the lungs daily.   01/07/11 01/07/12 Yes Tammy S Parrett, NP  Coenzyme Q10 (COQ10) 100 MG CAPS Take 1 capsule by mouth daily.     Yes Historical Provider, MD  fexofenadine (ALLEGRA) 180 MG tablet Take 180 mg by mouth daily.     Yes Historical Provider, MD  Glucosamine-Chondroit-Vit C-Mn (GLUCOSAMINE CHONDR 1500 COMPLX  PO) Take 1 tablet by mouth 3 (three) times daily.     Yes Historical Provider, MD  letrozole (FEMARA) 2.5 MG tablet Take 2.5 mg by mouth daily.   Yes Historical Provider, MD  levofloxacin (LEVAQUIN) 500 MG tablet Take 1 tablet (500 mg total) by mouth daily. 07/08/11 07/18/11 Yes Michele Mcalpine, MD  losartan-hydrochlorothiazide [facility]) 100-12.5 MG per tablet take 1 tablet by mouth once daily 06/21/11  Yes Wendall Stade, MD  metoprolol (LOPRESSOR) 50 MG tablet take 1 tablet by mouth twice a day 07/01/11  Yes Wendall Stade, MD  mometasone (NASONEX) 50 MCG/ACT nasal spray 2 sprays by Nasal route daily.     Yes Historical Provider, MD  nitroGLYCERIN (NITROSTAT) 0.4 MG SL tablet Take one tablet  By mouth SL every 5 minutes for a total of 3 doses as needed for  chest pain. 07/13/10  Yes Gaylord Shih, MD  Omega-3 Fatty Acids (FISH OIL) 1200 MG CAPS 2 tabs po bid    Yes Historical Provider, MD  omeprazole (PRILOSEC) 40 MG capsule take 1 capsule by mouth once daily 30 MINUTES BEFORE THE FIRST MEAL OF THE DAY 06/04/11  Yes Mardella Layman, MD  rosuvastatin (CRESTOR) 10 MG tablet Take 1 tablet (10 mg total) by mouth daily. 1 tab po daily( except on Tuesday and Thursday no tabs )  and on Wednesday 1/2 tab daily 11/28/10  Yes Wendall Stade, MD  traMADol (ULTRAM) 50 MG tablet Take 50 mg by mouth as needed. For pain 11/28/10  Yes Wendall Stade, MD  travoprost, benzalkonium, (TRAVATAN) 0.004 % ophthalmic solution Place 1 drop into both eyes at bedtime.    Yes Historical Provider, MD  vitamin B-12 (CYANOCOBALAMIN) 1000 MCG tablet Take 1,000 mcg by mouth daily.     Yes Historical Provider, MD  Vitamin D, Ergocalciferol, (DRISDOL) 50000 UNITS CAPS Take 50,000 Units by mouth every 30 (thirty) days. Once a month   Yes Historical Provider, MD   Review of Systems Chest pain and dyspnea with inclines as outline above.  She's had a productive cough for several wks and is now on levaquin.  Otherwise, all systems reviewed and negative.  Physical Exam  Blood pressure 128/70, pulse 78, height 4\' 11"  (1.499 m), weight 136 lb (61.689 kg).  General: Pleasant, NAD Psych: Normal affect. Neuro: Alert and oriented X 3. Moves all extremities spontaneously. HEENT: Normal  Neck: Supple without bruits or JVD. Lungs:  Resp regular and unlabored, CTA. Heart: RRR no s3, s4, or murmurs. Abdomen: Soft, non-tender, non-distended, BS + x 4.  Extremities: No clubbing, cyanosis or edema. DP/PT/Radials 2+ and equal bilaterally.  Accessory Clinical Findings  ECG - rsr, 70, no acute st/t changes.  Assessment & Plan  1.  Midsternal Chest Pain/CAD:  As above, pt has been having intermittent sscp/tightness that occurs exclusively when she walks up hills.  She has no problems on flat ground  or with usual activities around the house.  She is s/p CABG x 3 in 12/2009.  Will obtain a lexiscan MV (doesn't think she can walk on treadmill).  Cont current meds.  If MV markedly abnl, will need cath.  2.  HTN:  Stable.  3.  HL:  Followed closely in lipid clinic.  4.  Bronchitis:  On levaquin per pulm.  5.  Dispo:  Myoview as above, f/u in 2 wks.  Nicolasa Ducking, NP 07/15/2011, 2:50 PM

## 2011-07-18 DIAGNOSIS — J309 Allergic rhinitis, unspecified: Secondary | ICD-10-CM | POA: Diagnosis not present

## 2011-07-23 DIAGNOSIS — R51 Headache: Secondary | ICD-10-CM | POA: Diagnosis not present

## 2011-07-23 DIAGNOSIS — M5030 Other cervical disc degeneration, unspecified cervical region: Secondary | ICD-10-CM | POA: Diagnosis not present

## 2011-07-23 DIAGNOSIS — M9981 Other biomechanical lesions of cervical region: Secondary | ICD-10-CM | POA: Diagnosis not present

## 2011-07-24 ENCOUNTER — Ambulatory Visit (HOSPITAL_COMMUNITY): Payer: Medicare Other | Attending: Cardiology | Admitting: Radiology

## 2011-07-24 DIAGNOSIS — R5381 Other malaise: Secondary | ICD-10-CM | POA: Insufficient documentation

## 2011-07-24 DIAGNOSIS — R079 Chest pain, unspecified: Secondary | ICD-10-CM | POA: Insufficient documentation

## 2011-07-24 DIAGNOSIS — R42 Dizziness and giddiness: Secondary | ICD-10-CM | POA: Diagnosis not present

## 2011-07-24 DIAGNOSIS — R0609 Other forms of dyspnea: Secondary | ICD-10-CM | POA: Insufficient documentation

## 2011-07-24 DIAGNOSIS — E785 Hyperlipidemia, unspecified: Secondary | ICD-10-CM | POA: Insufficient documentation

## 2011-07-24 DIAGNOSIS — Z951 Presence of aortocoronary bypass graft: Secondary | ICD-10-CM | POA: Insufficient documentation

## 2011-07-24 DIAGNOSIS — R002 Palpitations: Secondary | ICD-10-CM | POA: Diagnosis not present

## 2011-07-24 DIAGNOSIS — J45909 Unspecified asthma, uncomplicated: Secondary | ICD-10-CM | POA: Diagnosis not present

## 2011-07-24 DIAGNOSIS — I10 Essential (primary) hypertension: Secondary | ICD-10-CM | POA: Diagnosis not present

## 2011-07-24 DIAGNOSIS — R0789 Other chest pain: Secondary | ICD-10-CM

## 2011-07-24 DIAGNOSIS — I2510 Atherosclerotic heart disease of native coronary artery without angina pectoris: Secondary | ICD-10-CM

## 2011-07-24 DIAGNOSIS — R0989 Other specified symptoms and signs involving the circulatory and respiratory systems: Secondary | ICD-10-CM | POA: Insufficient documentation

## 2011-07-24 MED ORDER — REGADENOSON 0.4 MG/5ML IV SOLN
0.4000 mg | Freq: Once | INTRAVENOUS | Status: AC
  Administered 2011-07-24: 0.4 mg via INTRAVENOUS

## 2011-07-24 MED ORDER — TECHNETIUM TC 99M TETROFOSMIN IV KIT
10.0000 | PACK | Freq: Once | INTRAVENOUS | Status: AC | PRN
  Administered 2011-07-24: 10 via INTRAVENOUS

## 2011-07-24 MED ORDER — TECHNETIUM TC 99M TETROFOSMIN IV KIT
30.0000 | PACK | Freq: Once | INTRAVENOUS | Status: AC | PRN
  Administered 2011-07-24: 30 via INTRAVENOUS

## 2011-07-24 NOTE — Progress Notes (Signed)
[facility] SITE 3 NUCLEAR MED [address] [place] [address] [phone]  Cardiology Nuclear Med Study  [name] is a 76 y.o. female     MRN : [id number]     DOB: 01-21-1936  Procedure Date: 07/24/2011  Nuclear Med Background Indication for Stress Test:  Evaluation for Ischemia and Graft Patency History:  Asthma,'07 Stents: LAD x2, 10/2007 NL EF: 89%, 11/11 CABG x3, 07/2010 Heart Cath: all grafts patent Cardiac Risk Factors: Hypertension and Lipids  Symptoms:  Chest Pain, Dizziness, DOE, Fatigue with Exertion, Light-Headedness and Palpitations   Nuclear Pre-Procedure Caffeine/Decaff Intake:  None NPO After: 8:00pm   Lungs:  diminished O2 Sat: 95% on room air. IV 0.9% NS with Angio Cath:  22g  IV Site: R Hand  IV Started by:  Stanton Kidney, EMT-P  Chest Size (in):  38 Cup Size: D  Height: 4\' 11"  (1.499 m)  Weight:  132 lb (59.875 kg)  BMI:  Body mass index is 26.66 kg/(m^2). Tech Comments:  Meds were taken as directed, per patient.    Nuclear Med Study 1 or 2 day study: 1 day  Stress Test Type:  Treadmill/Lexiscan  Reading MD: Marca Ancona, MD  Order Authorizing Provider:  Charlton Haws MD  Resting Radionuclide: Technetium 61m Tetrofosmin  Resting Radionuclide Dose: 11.0 mCi   Stress Radionuclide:  Technetium 20m Tetrofosmin  Stress Radionuclide Dose: 32.7 mCi           Stress Protocol Rest HR: 56 Stress HR: 91  Rest BP: 134/71 Stress BP: 169/70  Exercise Time (min): n/a METS: n/a   Predicted Max HR: 147 bpm % Max HR: 61.9 bpm Rate Pressure Product: 46962   Dose of Adenosine (mg):  n/a Dose of Lexiscan: 0.4 mg  Dose of Atropine (mg): n/a Dose of Dobutamine: n/a mcg/kg/min (at max HR)  Stress Test Technologist: Milana Na, EMT-P  Nuclear Technologist:  Domenic Polite, CNMT     Rest Procedure:  Myocardial perfusion imaging was performed at rest 45 minutes following the intravenous administration of Technetium 53m Tetrofosmin. Rest  ECG: NSR - Normal EKG  Stress Procedure:  The patient received IV Lexiscan 0.4 mg over 15-seconds with concurrent low level exercise and then Technetium 82m Tetrofosmin was injected at 30-seconds while the patient continued walking one more minute. There were no significant changes with Lexiscan. Quantitative spect images were obtained after a 45-minute delay. Stress ECG: Insignificant upsloping ST segment depression.  QPS Raw Data Images:  Normal; no motion artifact; normal heart/lung ratio. Stress Images:  Normal homogeneous uptake in all areas of the myocardium. Rest Images:  Normal homogeneous uptake in all areas of the myocardium. Subtraction (SDS):  There is no evidence of scar or ischemia. Transient Ischemic Dilatation (Normal <1.22):  1.01 Lung/Heart Ratio (Normal <0.45):  0.32  Quantitative Gated Spect Images QGS EDV:  48 ml QGS ESV:  8 ml  Impression Exercise Capacity:  Lexiscan with no exercise. BP Response:  Normal blood pressure response. Clinical Symptoms:  shortness of breath, warmth.  ECG Impression:  Nonsignificant < 1 mm ST depression in multiple leads.  Comparison with Prior Nuclear Study: No images to compare  Overall Impression:  Normal stress nuclear study.  LV Ejection Fraction: 84%.  LV Wall Motion:  NL LV Function; NL Wall Motion  Marca Ancona 07/24/2011

## 2011-07-25 DIAGNOSIS — J309 Allergic rhinitis, unspecified: Secondary | ICD-10-CM | POA: Diagnosis not present

## 2011-07-29 ENCOUNTER — Ambulatory Visit (INDEPENDENT_AMBULATORY_CARE_PROVIDER_SITE_OTHER): Payer: Medicare Other | Admitting: Nurse Practitioner

## 2011-07-29 ENCOUNTER — Encounter: Payer: Self-pay | Admitting: Nurse Practitioner

## 2011-07-29 VITALS — BP 150/80 | HR 60 | Ht 59.0 in | Wt 135.8 lb

## 2011-07-29 DIAGNOSIS — R072 Precordial pain: Secondary | ICD-10-CM

## 2011-07-29 DIAGNOSIS — R0789 Other chest pain: Secondary | ICD-10-CM

## 2011-07-29 DIAGNOSIS — E785 Hyperlipidemia, unspecified: Secondary | ICD-10-CM

## 2011-07-29 DIAGNOSIS — I2510 Atherosclerotic heart disease of native coronary artery without angina pectoris: Secondary | ICD-10-CM

## 2011-07-29 DIAGNOSIS — I10 Essential (primary) hypertension: Secondary | ICD-10-CM

## 2011-07-29 DIAGNOSIS — J309 Allergic rhinitis, unspecified: Secondary | ICD-10-CM | POA: Diagnosis not present

## 2011-07-29 NOTE — Progress Notes (Signed)
Patient Name: [name] Date of Encounter: 07/29/2011  Primary Care Provider:  Michele Mcalpine, MD, MD Primary Cardiologist:  P. Eden Emms, MD  Patient Profile  76 year old female with history of CAD who presents for followup.  Problem List   Past Medical History  Diagnosis Date  . Unspecified glaucoma   . Allergic rhinitis, cause unspecified   . Unspecified asthma   . Unspecified essential hypertension   . Other and unspecified hyperlipidemia   . Gastritis   . Abdominal pain, left lower quadrant   . Diverticulosis of colon (without mention of hemorrhage)   . Irritable bowel syndrome   . Colonic polyp   . Headache   . Dizziness   . Breast cancer, Left 12/20/2010  . Gallstones   . CAD (coronary artery disease)     a. 01/19/2010 s/p CABG x 3, lima->lad, vg->diag, vg->om1;  b. 06/2011 :Lexi MV: EF 84%, No ischemia.  . Bronchitis     hx of  . Pneumonia   . Hiatal hernia   . Fibromyalgia   . Esophageal reflux     hiatal hernia   Past Surgical History  Procedure Date  . Coronary angioplasty with stent placement     Stent 2007  . Open heart surgery     01/19/2010  . Coronary artery bypass graft   . Eye surgery     bilateral cataract removal,  . Breast mass excision 02/06/11    left  . Left lumpectomy 02/06/2011    Allergies  Allergies  Allergen Reactions  . Choline Fenofibrate Other (See Comments)    REACTION: pt states INTOL to Trilipix w/ "thigh burning"  . Simvastatin Other (See Comments)    REACTION: pt states INTOL to STATINS \\T \ refuses to restart  . Hydrocodone     Nightmare after taking cough syrup w/hydrocodone  . Letrozole     Makes patient hurt  . Adhesive (Tape) Rash  . Ceclor (Cefaclor) Rash  . Clarithromycin Rash  . Codeine Nausea Only  . Doxycycline Rash  . Lisinopril Cough    Developed ACE cough...  . Penicillins Itching and Rash    At injection site  . Tobramycin-Dexamethasone Rash    HPI  76 year old female with the above  problem list.  She was last seen in clinic a few weeks ago at which time she reported exertional chest tightness only occurring when she was walking up inclines.  She underwent stress testing last week which showed no evidence of ischemia and normal LV function.at home, she has continued to walk and remain active around her house.  She has no symptoms at all when ambulating on a flat surface or doing her housework or yard work.  She does continue to get mild pressure in her chest when walking up hills but she generally does not have to stop doing what she is doing.  Home Medications  Prior to Admission medications   Medication Sig Start Date End Date Taking? Authorizing Provider  aspirin 81 MG tablet Take 81 mg by mouth daily.     Yes Historical Provider, MD  budesonide-formoterol (SYMBICORT) 80-4.5 MCG/ACT inhaler Inhale 2 puffs into the lungs daily.   01/07/11 01/07/12 Yes Tammy S Parrett, NP  Coenzyme Q10 (COQ10) 100 MG CAPS Take 1 capsule by mouth daily.     Yes Historical Provider, MD  fexofenadine (ALLEGRA) 180 MG tablet Take 180 mg by mouth daily.     Yes Historical Provider, MD  Glucosamine-Chondroit-Vit C-Mn (GLUCOSAMINE CHONDR 1500 COMPLX PO)  Take 1 tablet by mouth 3 (three) times daily.     Yes Historical Provider, MD  letrozole (FEMARA) 2.5 MG tablet Take 2.5 mg by mouth daily.   Yes Historical Provider, MD  losartan-hydrochlorothiazide [facility]) 100-12.5 MG per tablet take 1 tablet by mouth once daily 06/21/11  Yes Wendall Stade, MD  metoprolol (LOPRESSOR) 50 MG tablet take 1 tablet by mouth twice a day 07/01/11  Yes Wendall Stade, MD  mometasone (NASONEX) 50 MCG/ACT nasal spray 2 sprays by Nasal route daily.     Yes Historical Provider, MD  nitroGLYCERIN (NITROSTAT) 0.4 MG SL tablet Take one tablet  By mouth SL every 5 minutes for a total of 3 doses as needed for chest pain. 07/13/10  Yes Gaylord Shih, MD  Omega-3 Fatty Acids (FISH OIL) 1200 MG CAPS 2 tabs po bid    Yes Historical  Provider, MD  omeprazole (PRILOSEC) 40 MG capsule take 1 capsule by mouth once daily 30 MINUTES BEFORE THE FIRST MEAL OF THE DAY 06/04/11  Yes Mardella Layman, MD  rosuvastatin (CRESTOR) 10 MG tablet She takes one tab on Monday, half a tablet on Wednesday, and one tablet on Friday. 11/28/10  Yes Wendall Stade, MD  traMADol (ULTRAM) 50 MG tablet Take 50 mg by mouth as needed. For pain 11/28/10  Yes Wendall Stade, MD  travoprost, benzalkonium, (TRAVATAN) 0.004 % ophthalmic solution Place 1 drop into both eyes at bedtime.    Yes Historical Provider, MD  Vitamin D, Ergocalciferol, (DRISDOL) 50000 UNITS CAPS Take 50,000 Units by mouth every 30 (thirty) days. Once a month   Yes Historical Provider, MD  vitamin B-12 (CYANOCOBALAMIN) 1000 MCG tablet Take 1,000 mcg by mouth daily.      Historical Provider, MD    Review of Systems  Mild exertional chest tightness when walking up inclines associated with dyspnea.  When she was last here she was on antibiotics for pneumonia which has resolved cough is improved.  She did have a GI bug over the weekend which is improving. All other systems reviewed and are otherwise negative except as noted above.  Physical Exam  Blood pressure 150/80, pulse 60, height 4\' 11"  (1.499 m), weight 135 lb 12.8 oz (61.598 kg).  General: Pleasant, NAD Psych: Normal affect. Neuro: Alert and oriented X 3. Moves all extremities spontaneously. HEENT: Normal  Neck: Supple without bruits or JVD. Lungs:  Resp regular and unlabored, CTA. Heart: RRR no s3, s4, or murmurs. Abdomen: Soft, non-tender, non-distended, BS + x 4.  Extremities: No clubbing, cyanosis or edema. DP/PT/Radials 2+ and equal bilaterally.  Assessment & Plan  1.  Mid sternal chest pain/CAD: She is status post Myoview last week which showed no evidence of ischemia.  We discussed her symptoms and I offered her long-acting nitrate therapy however she would prefer to avoid any additional medication and states that her  symptoms don't bother her much.  We will therefore continue her current medical regimen.  2.  Hypertension:  Blood pressure is elevated in clinic today.  She says it is usually in the one teens to 120s range at home therefore I will make no changes to her regimen today.  I recommended that she continue to follow this closely and to report back to [place] if pressures are consistently greater than 140.  3.  Hyperlipidemia:  This is followed closely in lipid clinic.  4.  Disposition:  followup with Dr. Eden Emms in 4 months or sooner if necessary.  Cristal Deer  Brion Aliment, NP 07/29/2011, 9:39 AM

## 2011-07-29 NOTE — Patient Instructions (Signed)
Your physician wants you to follow-up in: 4 months with Dr Eden Emms.  You will receive a reminder letter in the mail two months in advance. If you don't receive a letter, please call our office to schedule the follow-up appointment.

## 2011-07-30 DIAGNOSIS — M5030 Other cervical disc degeneration, unspecified cervical region: Secondary | ICD-10-CM | POA: Diagnosis not present

## 2011-07-30 DIAGNOSIS — M9981 Other biomechanical lesions of cervical region: Secondary | ICD-10-CM | POA: Diagnosis not present

## 2011-07-30 DIAGNOSIS — H04129 Dry eye syndrome of unspecified lacrimal gland: Secondary | ICD-10-CM | POA: Diagnosis not present

## 2011-07-30 DIAGNOSIS — R51 Headache: Secondary | ICD-10-CM | POA: Diagnosis not present

## 2011-08-01 ENCOUNTER — Other Ambulatory Visit (HOSPITAL_BASED_OUTPATIENT_CLINIC_OR_DEPARTMENT_OTHER): Payer: Medicare Other | Admitting: Lab

## 2011-08-01 ENCOUNTER — Other Ambulatory Visit: Payer: Self-pay | Admitting: *Deleted

## 2011-08-01 ENCOUNTER — Other Ambulatory Visit: Payer: Self-pay | Admitting: Oncology

## 2011-08-01 DIAGNOSIS — C50519 Malignant neoplasm of lower-outer quadrant of unspecified female breast: Secondary | ICD-10-CM

## 2011-08-01 LAB — COMPREHENSIVE METABOLIC PANEL
ALT: 23 U/L (ref 0–35)
CO2: 29 mEq/L (ref 19–32)
Calcium: 9.4 mg/dL (ref 8.4–10.5)
Chloride: 92 mEq/L — ABNORMAL LOW (ref 96–112)
Potassium: 4.3 mEq/L (ref 3.5–5.3)
Sodium: 130 mEq/L — ABNORMAL LOW (ref 135–145)
Total Protein: 6.7 g/dL (ref 6.0–8.3)

## 2011-08-01 LAB — CBC WITH DIFFERENTIAL/PLATELET
Eosinophils Absolute: 0.2 10*3/uL (ref 0.0–0.5)
MONO#: 0.5 10*3/uL (ref 0.1–0.9)
NEUT#: 2.1 10*3/uL (ref 1.5–6.5)
RBC: 4.34 10*6/uL (ref 3.70–5.45)
RDW: 14 % (ref 11.2–14.5)
WBC: 4.9 10*3/uL (ref 3.9–10.3)
nRBC: 0 % (ref 0–0)

## 2011-08-08 ENCOUNTER — Telehealth: Payer: Self-pay | Admitting: Oncology

## 2011-08-08 ENCOUNTER — Ambulatory Visit (HOSPITAL_BASED_OUTPATIENT_CLINIC_OR_DEPARTMENT_OTHER): Payer: Medicare Other | Admitting: Oncology

## 2011-08-08 VITALS — BP 135/72 | HR 61 | Temp 97.9°F | Ht 59.0 in | Wt 134.3 lb

## 2011-08-08 DIAGNOSIS — C50519 Malignant neoplasm of lower-outer quadrant of unspecified female breast: Secondary | ICD-10-CM

## 2011-08-08 DIAGNOSIS — J309 Allergic rhinitis, unspecified: Secondary | ICD-10-CM | POA: Diagnosis not present

## 2011-08-08 NOTE — Telephone Encounter (Signed)
gve the pt her sept 2013 appt calendar °

## 2011-08-08 NOTE — Progress Notes (Signed)
ID: [name]   DOB: 1935-10-04  MR#: [id number]  NWG#:[id number]  HISTORY OF PRESENT ILLNESS: [name] is a 76 year old [place] woman referred by Dr. Tilda [name] for evaluation and treatment in the setting of newly diagnosed breast cancer.   The patient had routine screening mammography June 2011 which was unremarkable.  Repeat screening mammography December 11, 2010, at [place], however, showed a possible abnormality in the left breast.  Additional imaging on October 18th showed an irregular, lobulated mass in the lower outer quadrant of the left breast which by ultrasound was irregular and hypoechoic.  It measured 2.5 cm by ultrasound.  The left axilla showed a benign-looking lymph node which did not appear to have changed compared to as seen in prior mammograms.   With this information, the patient underwent biopsy of the left breast mass October 25th, and this showed [phone] an invasive mammary carcinoma with some lobular features but strongly E-cadherin positive, and so an invasive ductal carcinoma.  There was evidence of perineural invasion, although no definite evidence of angiolymphatic invasion.  The tumor had a CISH ratio of HER-2 to CEP17 signals of 1.15, indicating no amplification.  Estrogen receptor was positive at 100%, progesterone receptor was positive at 61%, and the proliferation marker was 72%.   With this information, the patient was referred to Dr. Jamey [name], and bilateral breast MRIs were obtained December 24, 2010.  This showed the mass in the left breast to measure 1.7 cm, to be solitary, to be not contacting the pectoralis, and in addition, there was no suspicious finding in the right breast, no pathologically enlarged axillary or internal mammary chain lymph nodes.   INTERVAL HISTORY: Returns today to review tolerance of her letrozole. The interval history is otherwise stable  REVIEW OF SYSTEMS: She describes herself as tired as compared to "before all this  happened". She means before she went through her heart surgery and of course had her radiation. I would think some of the fatigue she is experiencing is going to be due to the expected slow recovery from all that. She is having more hot flashes although she was having hot flashes before. She is sleeping poorly. Again she was having insomnia problems prior to this already. She is having some aches and pains but they are in the same place as where she was having than before. She sees [name], a [facility] [place], for this. She has some shooting pains in the left breast and some numbness in the left axilla. This is of course expected. She had an episode of bronchitis in April. This was treated with biotics and has cleared. She has rare headaches, which are not unusual for her. Otherwise a detailed review of systems today was noncontributory.  PAST MEDICAL HISTORY: Past Medical History  Diagnosis Date  . Unspecified glaucoma   . Allergic rhinitis, cause unspecified   . Unspecified asthma   . Unspecified essential hypertension   . Other and unspecified hyperlipidemia   . Gastritis   . Abdominal pain, left lower quadrant   . Diverticulosis of colon (without mention of hemorrhage)   . Irritable bowel syndrome   . Colonic polyp   . Headache   . Dizziness   . Breast cancer, Left 12/20/2010  . Gallstones   . CAD (coronary artery disease)     a. 01/19/2010 s/p CABG x 3, lima->lad, vg->diag, vg->om1;  b. 06/2011 :Lexi MV: EF 84%, No ischemia.  . Bronchitis     hx of  .  Pneumonia   . Hiatal hernia   . Fibromyalgia   . Esophageal reflux     hiatal hernia  Coronary artery disease status post open heart surgery under [name] November 2011, with bypasses from the left internal mammary artery to the left anterior descending, and then reverse saphenous vein graft to the diagonal coronary artery.  The patient had previously had stents placed by Dr. Eden [name] in 2007.  Other problems include a  history of reflux esophagitis, hypertension, hyperlipidemia, rosacea and gallstones.  PAST SURGICAL HISTORY: Past Surgical History  Procedure Date  . Coronary angioplasty with stent placement     Stent 2007  . Open heart surgery     01/19/2010  . Coronary artery bypass graft   . Eye surgery     bilateral cataract removal,  . Breast mass excision 02/06/11    left  . Left lumpectomy 02/06/2011    FAMILY HISTORY Family History  Problem Relation Age of Onset  . Heart disease Brother   . Cancer Brother   . Diabetes Sister   The patient's father died at the age of 67 following a stroke.  The patient's mother died at 66 with a history of Alzheimer disease.  The patient had 2 sisters, one of whom developed breast cancer at the age of 4 and survives.  The patient has a brother who apparently had lymphoma.  He is also a survivor.  GYNECOLOGIC HISTORY: Menarche age 76.  Menopause in the mid 1990s.  She never used hormone replacement.  She is a GX, P2.  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  Her husband, [name],  has worked as a Curator and is still very active in his shop.   Daughter, [name], lives in [place] and is a home school mom.  Daughter, [name],  lives in [place] and works as a Geophysicist/field seismologist for the [employer].  The patient attends the [employer] in [place].   ADVANCED DIRECTIVES:  HEALTH MAINTENANCE: History  Substance Use Topics  . Smoking status: Never Smoker   . Smokeless tobacco: Never Used  . Alcohol Use: No     Colonoscopy: [name]  PAP: 2011  Bone density: 2012/[facility]  Lipid panel:  Allergies  Allergen Reactions  . Choline Fenofibrate Other (See Comments)    REACTION: pt states INTOL to Trilipix w/ "thigh burning"  . Simvastatin Other (See Comments)    REACTION: pt states INTOL to STATINS \\T \ refuses to restart  . Hydrocodone     Nightmare after taking cough syrup  w/hydrocodone  . Letrozole     Makes patient hurt  . Adhesive (Tape) Rash  . Ceclor (Cefaclor) Rash  . Clarithromycin Rash  . Codeine Nausea Only  . Doxycycline Rash  . Lisinopril Cough    Developed ACE cough...  . Penicillins Itching and Rash    At injection site  . Tobramycin-Dexamethasone Rash    Current Outpatient Prescriptions  Medication Sig Dispense Refill  . aspirin 81 MG tablet Take 81 mg by mouth daily.        . budesonide-formoterol (SYMBICORT) 80-4.5 MCG/ACT inhaler Inhale 2 puffs into the lungs daily.        . Coenzyme Q10 (COQ10) 100 MG CAPS Take 1 capsule by mouth daily.        . fexofenadine (ALLEGRA) 180 MG tablet Take 180 mg by mouth daily.        . Glucosamine-Chondroit-Vit  C-Mn (GLUCOSAMINE CHONDR 1500 COMPLX PO) Take 1 tablet by mouth 3 (three) times daily.        [name] letrozole (FEMARA) 2.5 MG tablet Take 2.5 mg by mouth daily.      [name] losartan-hydrochlorothiazide (HYZAAR) 100-12.5 MG per tablet take 1 tablet by mouth once daily  30 tablet  12  . metoprolol (LOPRESSOR) 50 MG tablet take 1 tablet by mouth twice a day  60 tablet  6  . mometasone (NASONEX) 50 MCG/ACT nasal spray 2 sprays by Nasal route daily.        . nitroGLYCERIN (NITROSTAT) 0.4 MG SL tablet Take one tablet  By mouth SL every 5 minutes for a total of 3 doses as needed for chest pain.  25 tablet  3  . Omega-3 Fatty Acids (FISH OIL) 1200 MG CAPS 2 tabs po bid       . omeprazole (PRILOSEC) 40 MG capsule take 1 capsule by mouth once daily 30 MINUTES BEFORE THE FIRST MEAL OF THE DAY  30 capsule  6  . rosuvastatin (CRESTOR) 10 MG tablet Take 1 tablet (10 mg total) by mouth daily. 1 tab po daily( except on Tuesday and Thursday no tabs )  and on Wednesday 1/2 tab daily  30 tablet  6  . traMADol (ULTRAM) 50 MG tablet Take 50 mg by mouth as needed. For pain      . travoprost, benzalkonium, (TRAVATAN) 0.004 % ophthalmic solution Place 1 drop into both eyes at bedtime.       . vitamin B-12 (CYANOCOBALAMIN) 1000  MCG tablet Take 1,000 mcg by mouth daily.        . Vitamin D, Ergocalciferol, (DRISDOL) 50000 UNITS CAPS Take 50,000 Units by mouth every 30 (thirty) days. Once a month      . DISCONTD: budesonide-formoterol (SYMBICORT) 80-4.5 MCG/ACT inhaler Inhale 2 puffs into the lungs 2 (two) times daily.  1 Inhaler  12    OBJECTIVE: Middle-aged white woman in no acute distress Filed Vitals:   08/08/11 1055  BP: 135/72  Pulse: 61  Temp: 97.9 F (36.6 C)     Body mass index is 27.13 kg/(m^2).    ECOG FS: 1  Sclerae unicteric Oropharynx clear No peripheral adenopathy Lungs no rales or rhonchi Heart regular rate and rhythm Abd benign MSK no focal spinal tenderness; moderately reduced range of motion of the left upper extremity, without lymphedema per Neuro: nonfocal Breasts: The right breast is unremarkable; left breast is status post lumpectomy and radiation. There is some residual hyperpigmentation. There is no evidence of local recurrence  LAB RESULTS: Lab Results  Component Value Date   WBC 4.9 08/01/2011   NEUTROABS 2.1 08/01/2011   HGB 13.3 08/01/2011   HCT 38.9 08/01/2011   MCV 89.6 08/01/2011   PLT 267 08/01/2011      Chemistry      Component Value Date/Time   NA 130* 08/01/2011 1133   K 4.3 08/01/2011 1133   CL 92* 08/01/2011 1133   CO2 29 08/01/2011 1133   BUN 12 08/01/2011 1133   CREATININE 0.65 08/01/2011 1133      Component Value Date/Time   CALCIUM 9.4 08/01/2011 1133   ALKPHOS 64 08/01/2011 1133   AST 22 08/01/2011 1133   ALT 23 08/01/2011 1133   BILITOT 1.1 08/01/2011 1133       Lab Results  Component Value Date   LABCA2 20 08/01/2011    No components found with this basename: [id number]    No results  found for this basename: INR:1;PROTIME:1 in the last 168 hours  Urinalysis    Component Value Date/Time   COLORURINE DK. ORANGE 06/19/2011 1237   APPEARANCEUR CLEAR 06/19/2011 1237   LABSPEC <=1.005 06/19/2011 1237   PHURINE 6.0 06/19/2011 1237   GLUCOSEU NEGATIVE 01/18/2010 0115   HGBUR  TRACE-LYSED 06/19/2011 1237   BILIRUBINUR NEGATIVE 06/19/2011 1237   KETONESUR NEGATIVE 06/19/2011 1237   PROTEINUR NEGATIVE 01/18/2010 0115   UROBILINOGEN 1.0 06/19/2011 1237   NITRITE POSITIVE 06/19/2011 1237   LEUKOCYTESUR SMALL 06/19/2011 1237   STUDIES: No new results found.  ASSESSMENT: 76 y.o. [place] woman status post left lumpectomy and sentinel lymph node biopsy December of 2012 for a T2 N0, stage IIA invasive ductal carcinoma, grade 3, HER-2 not amplified, strongly estrogen and progesterone receptor positive with an elevated proliferation marker at 72%; with an Oncotype recurrence score of 21, in the intermediate range, indicating a 13% and distant disease recurrence rate of 13% within 10 years if the patient took tamoxifen for 5 years. She completed radiation  March of 2011 and started letrozole at that time.  PLAN: I think [name] is really still in the recovery phase from her cardiac surgery and radiation. I think her mild to moderate fatigue will improve over the next 2-3 months, especially if she continues to take walks most mornings, as she is doing. I don't hear that she "hurts all over", but rather that some of her old areas of pain, like the right shoulder, are little bit more insistent. I think this may be because of her relative inactivity instead of the letrozole. In short I am not hearing significant symptomatology definitively due to letrozole, other than possibly worsening hot flashes at night  As far as that is concerned I offered her gabapentin and 100 mg by mouth at bedtime. She really did not want to try it because "I'm a ready on so many pills". Hot flashes do tend to get better with time.  After much discussion we decided we would simply continue letrozole and she will see [place] again in 3 months. If she is tolerating things better by then, we will start q. 6 month followup at that time. If she still having a variety of complaints which might possibly be related to letrozole,  we will interrupt that medication for 3 months and reassess. Olliver Boyadjian C    08/08/2011

## 2011-08-13 DIAGNOSIS — M9981 Other biomechanical lesions of cervical region: Secondary | ICD-10-CM | POA: Diagnosis not present

## 2011-08-13 DIAGNOSIS — M5030 Other cervical disc degeneration, unspecified cervical region: Secondary | ICD-10-CM | POA: Diagnosis not present

## 2011-08-15 DIAGNOSIS — J309 Allergic rhinitis, unspecified: Secondary | ICD-10-CM | POA: Diagnosis not present

## 2011-08-15 DIAGNOSIS — Z124 Encounter for screening for malignant neoplasm of cervix: Secondary | ICD-10-CM | POA: Diagnosis not present

## 2011-08-15 DIAGNOSIS — E559 Vitamin D deficiency, unspecified: Secondary | ICD-10-CM | POA: Diagnosis not present

## 2011-08-15 DIAGNOSIS — Z01419 Encounter for gynecological examination (general) (routine) without abnormal findings: Secondary | ICD-10-CM | POA: Diagnosis not present

## 2011-08-15 LAB — HM PAP SMEAR: HM Pap smear: NEGATIVE

## 2011-08-22 ENCOUNTER — Telehealth: Payer: Self-pay | Admitting: *Deleted

## 2011-08-22 NOTE — Telephone Encounter (Signed)
Pt called to state ongoing and increasing side effects post starting letrozole.  1. Nausea - occurring all day- " like I need to upchuck...which I did once but that was after I took a walk  "  2." no energy.. No get up and go "  3. Increased joint / muscle pain most notable in hips and thighs.  Return call number given as [phone].  This note will be reviewed with MD.

## 2011-08-23 ENCOUNTER — Telehealth: Payer: Self-pay | Admitting: Oncology

## 2011-08-23 ENCOUNTER — Other Ambulatory Visit: Payer: Self-pay | Admitting: Oncology

## 2011-08-23 DIAGNOSIS — J309 Allergic rhinitis, unspecified: Secondary | ICD-10-CM | POA: Diagnosis not present

## 2011-08-23 NOTE — Telephone Encounter (Signed)
Pt states that she has nausea that is bad and getting worse. She reported this to Dr,. Magrinat when she was here for a follow up.   She is drinking to try to stay hydrated and eat some food but she has no appetite.   Dr. Darnelle Catalan reviewed and stated she could stop the letrozole and see if it helps but typically this is not related.   I suggested that the patient call and schedule an appt next week to see her primary but try stopping the letrozole over the weekend to see if it helps.  If not, she will have an appt scheduled.  Encouraged her to stay hydrated as she is doing.

## 2011-08-26 ENCOUNTER — Encounter: Payer: Self-pay | Admitting: Adult Health

## 2011-08-26 ENCOUNTER — Ambulatory Visit (INDEPENDENT_AMBULATORY_CARE_PROVIDER_SITE_OTHER): Payer: Medicare Other | Admitting: Adult Health

## 2011-08-26 VITALS — BP 134/72 | HR 59 | Temp 97.3°F | Ht <= 58 in | Wt 125.0 lb

## 2011-08-26 DIAGNOSIS — E785 Hyperlipidemia, unspecified: Secondary | ICD-10-CM

## 2011-08-26 DIAGNOSIS — IMO0001 Reserved for inherently not codable concepts without codable children: Secondary | ICD-10-CM

## 2011-08-26 DIAGNOSIS — J309 Allergic rhinitis, unspecified: Secondary | ICD-10-CM | POA: Diagnosis not present

## 2011-08-26 DIAGNOSIS — C50519 Malignant neoplasm of lower-outer quadrant of unspecified female breast: Secondary | ICD-10-CM

## 2011-08-26 NOTE — Progress Notes (Signed)
Subjective:    Patient ID: [name], female    DOB: [birth year], 76 y.o.   MRN: [id number]  HPI 76 y/o WF >> multiple medical problems including AR & Asthma;  HBP;  CAD followed by Walker Kehr & s/p 3 vessel CABG 11/11 by DrGearhardt;  Hyperlipidemia followed in the [facility];  GERD/ IBS/ Divertics;  Breast Cancer diagnosed 12/12;  Fibromyalgia;  Chr HAs & dizziness...  ~  May 04, 2009:  presents w/ HA- posterior pain> band-like c/w muscle contraction HA... she under some stress dealing w/ father's estate... she went to the ER w/ CP & elevated BP> she ruled out for MI & was started on HYZAAR 100-12.5 by DrNishan in f/u... she has a repeat Myoview pending next week (it was neg)... she continues her Lipid Management per [facility] Clinic on FishOil, FlaxSeedOil, & diet...  she had f/u colonoscopy 3/11 by DrPatterson> divertics,1 polyp= tubular adenoma, f/u 53yrs...   ~  Jul 25, 2009:  she's noted some incr SOB, wheezing over the last 2 weeks w/ the heat... we discussed using the Symbicort, Proair, etc... she saw Walker Kehr 5/11- doing well, stopped her Plavix, no angina & non-ischemic Myoview 3/11... BP controlled on meds;  continues on FishOil & FlaxSeesOil for her Chol (intol to all other meds) followed by the clinic but FLP remains deranged w/ TChol 277 & LDL 193;  we will give her a TDAP today...  ~  May 08, 2010:  [name] has had a busy interval hx> epis of CP, cath w/ in-stent restenosis, then CABG x3 11/11 by DrGearhardt... now c/o refractory URI x 6wks w/ sinusitis/ bronchitis/ laryngitis & rx w/ ZPak, Biaxin, etc from DrESL "I'm allergic" she says... finally received Avelox/ Medrol & improved...     AR/ Asthma>  DrESL stopped her Symbicort in favor of QVar (she didn't like it & returned to [place])...    HBP>  controlled on Toprol & Hyzaar w/ BP 130/70 today... denies visual changes, CP, palipit, dizziness, syncope, dyspnea, edema, etc...     CAD>  s/p CABG as noted & saw both DrNishan & DrGearhardt  12/11 (notes reviewed)...    Hyperlipid>  followed in the [facility] & FLP 2/12 reviewed on Cres10 2d per week...    Others>  she has persistant FM symptoms and chr HAs, uses Vicodin Prn...  ~  Sep 26, 2010:  17mo ROV & she was re-hospitalized 6/12 by [facility] for CP> cath showed severe 3 vessel CAD w/ 3/3 grafts patent & norm LVF w/ EF= 65-70%; no change in meds; she states "they don't know what it was"; she saw [place] 7/12 in office f/u & he noted Rx limited by side effects, continued same regimen...  Her CC remains various aches & pains, notes breathing is good w/o cough, sputum, SOB, etc;  Notes GI w/u DrPatterson prev w/ Gallstones...  ~  April 02, 2011:  49mo ROV & [name] has had another event filled interval> this time w/ mammogram showing a left breast nodule & surg (partial mastectomy & sentinel node bx) by [name] 12/12 that proved to be an invasive ductal carcinoma, 2.3cm size, w/ neg lymphovasc invasion, clear margins (but close at 1mm), & neg nodes;  subseq XRT by DrKindard (had her 11th treatment today); and Oncology eval by DrMagrinat who plans hormone Rx later (no chemo she says)... She is tolerating treatment well & spirits are up, [name] had to aspirate a seroma & it is now resolved...    [name] had her usual autumn  URI, AB exac in Nov RX w/ ZPak, Pred, Mucinex, Hydromet but reacted to the hydrocodone & prefers OTC non-narcotic cough suppression & we discussed MUCINEX DM & DELSYM (offered Tussionex if ineffective);  She had the Flu vaccine 10/12...     She denies CP palpit, dizzy, syncope, incr SOB, edema, etc; Cardiac stable & she is thrilled w/ that...    She continues f/u in the [facility] & has been titrated up to Cres10mg  M&F, plus 5mg  on Wednes; last FLP 10/12 was the best yet w/ TChol 167, TG 203, HDL 54, LDL 88...   08/26/2011 Acute OV  Complains of c/o nausea, body aches, and lack of energy x 2 weeks. She has been concerned this was caused by her Femara and Statin therapy so she stopped  them both 1 week ago. She is feeling better with resolved nausea and decreased body aches.  Seen by Dr. Darnelle Catalan on 08/08/11 who felt this was not related to Femara.  She denies v/d, urinary symptoms, abdominal pain , fever or edema.  Labs done by oncology was essentially unremarbable except sodium was at 130.  We discussed her discussing the statin rx with Lipid clinic and consideration of alternate dose  To see if less aches.  Seen by cardiology recently w/ stress test neg. LFT nml .  Also have recommended she restart her Femara but change it to bedtime dosing.           Problem List:  GLAUCOMA (ICD-365.9) - she is sched for right cataract surg by DrBevis in Feb...  ALLERGIC RHINITIS (ICD-477.9) - she uses Alavert & Nasonex Prn; followed by DrESL...  ASTHMA & Asthmatic Bronchitis >> on SYMBICORT 80- 2spBid and improved... ~  5/11:  notes incr chest symptoms in the heat & rec to take the Symbicort 2sp Bid... ~  2012:  After her CABG & repeat hosp by [facility] she is noted to be off her inhalers... ~  10/12:  Treated w/ Zpak, Pred, Mucinex, plus her Symbicort80 & improved...  HYPERTENSION (ICD-401.9) - controlled on TOPROL 50mg Bid & HYZAAR 100-12.5 daily (off Lisinopril due to ACE cough)...  ~  8/12: BP=  128/84 & tol meds well; denies HA, fatigue, visual changes, CP, palipit, syncope, dyspnea, edema, etc... ~  2/13: BP= 140/64 & she remains essentially asymptomatic on Rx...  CORONARY ARTERY DISEASE (ICD-414.00) - on ASA 81mg /d (intol to 325 she says) & off Plavix per Walker Kehr...  Cardiolite 9/08 showed CP & HBP response, EF+85%... had cath 10/08 w/ single vessel dis w/ mod ostial LAD stenosis and patent LAD stent, normal LVF.[name]. option for med Rx vs off pump LIMA... she notes some CP/ pressure "burning" when walking up a certain hill during her daily walks, but states this is stable and "no worse"... ~  Myoview 9/09 was neg- no scar or ischemia, EF= 89%, hypertensive response- Lisinopril10  added. ~  f/u Myoview 3/11 was neg- no scar or ischemia, EF= 86%, norm BP response, fair exerc capacity. ~  saw Walker Kehr 5/11- no CP, he stopped her Plavix, stable- continue other meds same. ~  She had recurrent CP 11/11 w/ in-stent restenosis on cath> subseq 3 vessel CABG by DrGearhart & doing well since then... ~  6/12:  Hosp w/ CP & repeat cath showed CAD & 3/3 grafts patent, EF= 65-70%  HYPERLIPIDEMIA (ICD-272.4) - on CRESTOR 10mg  on M&F, plus 5mg  on Wednes, FISH OIL 1200mg  Tid + Flax Seed Oil... she has been intol to statins in the past>  tried low dose Trilipix but stopped this due to "thigh burning">  now followed in the [facility]--- ~  FLP 7/08 showed TChol 217, TG 236, HDL 46, LDL 150...  ~  FLP 6/09 showed TChol 233, TG 263, HDL 46, LDL 150... she agrees to try LOW DOSE Trilipix. ~  FLP 11/09 showed TChol 162, TG 167, HDL 45, LDL 83... ~  FLP 11/10 showed TChol 240, TG 271, HDL 45, LDL 150... [facility] Rx w/ FishOil & FlaxSeedOil. ~  FLP 2/11 showed TChol 285, TG 156, HDL 79, LDL 180... LC is aware & working w/ her regularly. ~  FLP 5/11 showed TChol 277, TG 241, HDL55, LDL 193...  LC added Cres10 just 2d per week. ~  FLP 10/11 showed TChol 198, TG 215, HDL 53, LDL 109... much improved on Cres10 twice weekly. ~  She continues to f/u w/ Lipid clinic & they titrated up the Cres10 on M&F, plus 5mg  on Wednes> ~  FLP  10/12 was the best yet w/ TChol 167, TG 203, HDL 54, LDL 88  GERD (ICD-530.81) - on OMEPRAZOLE 40mg /d... last EGD by DrPatterson 8/07 revealed sm HH, & gastritis and dilatation done... (RUT=neg, PPI Rx)... LEVSIN 0.125mg  helps her esoph spasm... Prn Phenergan for nausea.  DIVERTICULOSIS, COLON (ICD-562.10) & IRRITABLE BOWEL SYNDROME (ICD-564.1) ~  colonoscopy 2/03 by DrPatterson showed divertics and 5 mm polyp... ~  colonoscopy 3/11 showed divertics & cecal polyp= tubular adenoma, w/ f/u planned 32yrs.  GALLSTONES>>  Diagnosed by DrPatterson and referred to [facility] DrWilson, pt  asked to call prn fopr incr symptoms to consider surg...  BREAST CANCER >> Abn mammogram 10/12 showing a left breast nodule & subseq surg (partial mastectomy & sentinel node bx) by [name] 12/12 that proved to be an invasive ductal carcinoma, 2.3cm size, w/ neg lymphovasc invasion, clear margins (but close at 1mm), & neg nodes; she had XRT by DrKindard (had her 11th treatment today); and Oncology eval by DrMagrinat who plans hormone Rx later (no chemo she says)...  ~  2/13: She is tolerating treatment well & spirits are up, [name] had to aspirate a seroma & it is now resolved...  FIBROMYALGIA (ICD-729.1) - she c/o chr fatigue, aching/ sore, on Tramadol & Skelaxin, but most benefit from low dose Hydrocodone ~1/2 tab Prn... I have recommended an increase exercise program to her... ~  5/11:  Vit D level = 50 on 50000 u weekly by Rock Nephew, NP  HEADACHE (ICD-784.0) - eval at [facility] in 2002, but she notes Rx didn't help... ~  3/11:  presented w/ muscle contraction HA's> Rx Tramadol 50mg  Q6H Prn.  DIZZINESS, CHRONIC (ICD-780.4) - MRI 2/09 per Walker Kehr showed atrophy, sm vessel dis, NAD...  Health Maintenance: ~  GI:  DrPatterson & up to date on colon screening. ~  GYN:  yearly f/u w/ Rock Nephew, NP for PAP, Mammogram at [facility], ?last BMD. ~  Immunizations:  yearly Flu vaccines in fall, Pneumovax in 2007?, TDAP given 5/11...   Past Surgical History  Procedure Date  . Coronary angioplasty with stent placement     Stent 2007  . Open heart surgery     01/19/2010  . Coronary artery bypass graft   . Eye surgery     bilateral cataract removal,  . Breast mass excision 02/06/11    left  . Left lumpectomy 02/06/2011    Outpatient Encounter Prescriptions as of 08/26/2011  Medication Sig Dispense Refill  . aspirin 81 MG tablet Take 81 mg by  mouth daily.        . budesonide-formoterol (SYMBICORT) 80-4.5 MCG/ACT inhaler Inhale 2 puffs into the lungs as needed.       . Coenzyme Q10  (COQ10) 100 MG CAPS Take 1 capsule by mouth daily.        . fexofenadine (ALLEGRA) 180 MG tablet Take 180 mg by mouth daily.        . Glucosamine-Chondroit-Vit C-Mn (GLUCOSAMINE CHONDR 1500 COMPLX PO) Take 1 tablet by mouth 3 (three) times daily.        [name] losartan-hydrochlorothiazide (HYZAAR) 100-12.5 MG per tablet take 1 tablet by mouth once daily  30 tablet  12  . metoprolol (LOPRESSOR) 50 MG tablet take 1 tablet by mouth twice a day  60 tablet  6  . mometasone (NASONEX) 50 MCG/ACT nasal spray 2 sprays by Nasal route daily.        . nitroGLYCERIN (NITROSTAT) 0.4 MG SL tablet Take one tablet  By mouth SL every 5 minutes for a total of 3 doses as needed for chest pain.  25 tablet  3  . Omega-3 Fatty Acids (FISH OIL) 1200 MG CAPS 2 tabs po bid       . omeprazole (PRILOSEC) 40 MG capsule take 1 capsule by mouth once daily 30 MINUTES BEFORE THE FIRST MEAL OF THE DAY  30 capsule  6  . rosuvastatin (CRESTOR) 10 MG tablet Take 1 tablet (10 mg total) by mouth daily. 1 tab po daily( except on Tuesday and Thursday no tabs )  and on Wednesday 1/2 tab daily  30 tablet  6  . traMADol (ULTRAM) 50 MG tablet Take 50 mg by mouth as needed. For pain      . travoprost, benzalkonium, (TRAVATAN) 0.004 % ophthalmic solution Place 1 drop into both eyes at bedtime.       . vitamin B-12 (CYANOCOBALAMIN) 1000 MCG tablet Take 1,000 mcg by mouth daily.        . Vitamin D, Ergocalciferol, (DRISDOL) 50000 UNITS CAPS Take 50,000 Units by mouth. Every other week      . letrozole (FEMARA) 2.5 MG tablet Take 2.5 mg by mouth daily.        Allergies  Allergen Reactions  . Choline Fenofibrate Other (See Comments)    REACTION: pt states INTOL to Trilipix w/ "thigh burning"  . Simvastatin Other (See Comments)    REACTION: pt states INTOL to STATINS \\T \ refuses to restart  . Hydrocodone     Nightmare after taking cough syrup w/hydrocodone  . Letrozole     Makes patient hurt  . Adhesive (Tape) Rash  . Ceclor (Cefaclor) Rash  .  Clarithromycin Rash  . Codeine Nausea Only  . Doxycycline Rash  . Lisinopril Cough    Developed ACE cough...  . Penicillins Itching and Rash    At injection site  . Tobramycin-Dexamethasone Rash    Current Medications, Allergies, Past Medical History, Past Surgical History, Family History, and Social History were reviewed in [employer] record.    Review of Systems    Constitutional:   No  weight loss, night sweats,  Fevers, chills,  ++fatigue, or  lassitude.  HEENT:   No headaches,  Difficulty swallowing,  Tooth/dental problems, or  Sore throat,                No sneezing, itching, ear ache, nasal congestion, post nasal drip,   CV:  No chest pain,  Orthopnea, PND, swelling in lower extremities, anasarca, dizziness, palpitations,  syncope.   GI  No heartburn, indigestion, abdominal pain, nausea, vomiting, diarrhea, change in bowel habits, loss of appetite, bloody stools.   Resp:   No coughing up of blood.  No change in color of mucus.  No wheezing.  No chest wall deformity  Skin: no rash or lesions.  GU: no dysuria, change in color of urine, no urgency or frequency.  No flank pain, no hematuria   MS:  No joint   swelling.       Psych:     No memory loss.       Objective:   Physical Exam    WD, WN, 76 y/o WF in NAD... GENERAL:  Alert & oriented; pleasant & cooperative... HEENT:  [place]/AT,   EACs-clear, TMs-wnl, NOSE-clear, THROAT-clear & wnl. NECK:  Supple w/ fair ROM; no JVD; normal carotid impulses w/o bruits; no thyromegaly or nodules palpated; no lymphadenopathy. CHEST:  Clear to P & A; without wheezes/ rales/ or rhonchi heard... HEART:  Regular Rhythm; without murmurs/ rubs/ or gallops detected... ABDOMEN:  Soft & nontender; normal bowel sounds; no organomegaly or masses palpated... EXT: without deformities, mild arthritic changes  NEURO:   gait normal & balance OK. DERM:  No lesions noted; no rash etc...++triggers along upper back     Assessment & Plan:

## 2011-08-26 NOTE — Patient Instructions (Addendum)
Change Letrozole At bedtime  .  May use Zofran 4mg  Twice daily  As needed  Nausea.  Contact Lipid clinic regarding crestor.  Please contact office for sooner follow up if symptoms do not improve or worsen or seek emergency care  follow up Dr. [name]  In 1 month as planned

## 2011-08-26 NOTE — Assessment & Plan Note (Signed)
Advised to restart Femara but at At bedtime   May have zofran As needed   Call back if symptoms return

## 2011-08-26 NOTE — Assessment & Plan Note (Signed)
Now off statin therapy due to myalgias  Advised to discuss with lipid clinic  Will need to restart as pt is high risk with LDL goal <70

## 2011-08-27 ENCOUNTER — Encounter: Payer: Self-pay | Admitting: Oncology

## 2011-08-27 DIAGNOSIS — M5412 Radiculopathy, cervical region: Secondary | ICD-10-CM | POA: Diagnosis not present

## 2011-08-27 DIAGNOSIS — M9981 Other biomechanical lesions of cervical region: Secondary | ICD-10-CM | POA: Diagnosis not present

## 2011-08-27 DIAGNOSIS — M5030 Other cervical disc degeneration, unspecified cervical region: Secondary | ICD-10-CM | POA: Diagnosis not present

## 2011-08-27 NOTE — Progress Notes (Signed)
Put Combined Insurance form on nurse's desk °

## 2011-08-30 ENCOUNTER — Encounter: Payer: Self-pay | Admitting: Oncology

## 2011-08-30 NOTE — Progress Notes (Signed)
Mailed combined insurance form to patient's home.

## 2011-09-10 DIAGNOSIS — J45909 Unspecified asthma, uncomplicated: Secondary | ICD-10-CM | POA: Diagnosis not present

## 2011-09-10 DIAGNOSIS — J3089 Other allergic rhinitis: Secondary | ICD-10-CM | POA: Diagnosis not present

## 2011-09-10 DIAGNOSIS — J301 Allergic rhinitis due to pollen: Secondary | ICD-10-CM | POA: Diagnosis not present

## 2011-09-10 DIAGNOSIS — J309 Allergic rhinitis, unspecified: Secondary | ICD-10-CM | POA: Diagnosis not present

## 2011-09-17 DIAGNOSIS — M9981 Other biomechanical lesions of cervical region: Secondary | ICD-10-CM | POA: Diagnosis not present

## 2011-09-17 DIAGNOSIS — M5412 Radiculopathy, cervical region: Secondary | ICD-10-CM | POA: Diagnosis not present

## 2011-09-17 DIAGNOSIS — M5030 Other cervical disc degeneration, unspecified cervical region: Secondary | ICD-10-CM | POA: Diagnosis not present

## 2011-09-27 DIAGNOSIS — J309 Allergic rhinitis, unspecified: Secondary | ICD-10-CM | POA: Diagnosis not present

## 2011-10-01 ENCOUNTER — Ambulatory Visit (INDEPENDENT_AMBULATORY_CARE_PROVIDER_SITE_OTHER): Payer: Medicare Other | Admitting: Pulmonary Disease

## 2011-10-01 ENCOUNTER — Encounter: Payer: Self-pay | Admitting: Pulmonary Disease

## 2011-10-01 VITALS — BP 132/64 | HR 65 | Temp 96.8°F | Ht 60.0 in | Wt 134.2 lb

## 2011-10-01 DIAGNOSIS — I2510 Atherosclerotic heart disease of native coronary artery without angina pectoris: Secondary | ICD-10-CM

## 2011-10-01 DIAGNOSIS — K5730 Diverticulosis of large intestine without perforation or abscess without bleeding: Secondary | ICD-10-CM

## 2011-10-01 DIAGNOSIS — J45909 Unspecified asthma, uncomplicated: Secondary | ICD-10-CM

## 2011-10-01 DIAGNOSIS — E785 Hyperlipidemia, unspecified: Secondary | ICD-10-CM

## 2011-10-01 DIAGNOSIS — IMO0001 Reserved for inherently not codable concepts without codable children: Secondary | ICD-10-CM

## 2011-10-01 DIAGNOSIS — J309 Allergic rhinitis, unspecified: Secondary | ICD-10-CM | POA: Diagnosis not present

## 2011-10-01 DIAGNOSIS — I10 Essential (primary) hypertension: Secondary | ICD-10-CM | POA: Diagnosis not present

## 2011-10-01 DIAGNOSIS — C50519 Malignant neoplasm of lower-outer quadrant of unspecified female breast: Secondary | ICD-10-CM

## 2011-10-01 DIAGNOSIS — K219 Gastro-esophageal reflux disease without esophagitis: Secondary | ICD-10-CM

## 2011-10-01 DIAGNOSIS — K589 Irritable bowel syndrome without diarrhea: Secondary | ICD-10-CM

## 2011-10-01 NOTE — Patient Instructions (Addendum)
Today we updated your med list in our EPIC system...    Continue your current medications the same...  Keep up the good work w/ your exercise program...  Let me know if we can be of assistance to you in any way...  Let's plan a routine follow up in 6 months, but call anytime for [problems.Marland KitchenMarland Kitchen

## 2011-10-02 DIAGNOSIS — M5030 Other cervical disc degeneration, unspecified cervical region: Secondary | ICD-10-CM | POA: Diagnosis not present

## 2011-10-02 DIAGNOSIS — M545 Low back pain: Secondary | ICD-10-CM | POA: Diagnosis not present

## 2011-10-02 DIAGNOSIS — M9981 Other biomechanical lesions of cervical region: Secondary | ICD-10-CM | POA: Diagnosis not present

## 2011-10-03 DIAGNOSIS — J309 Allergic rhinitis, unspecified: Secondary | ICD-10-CM | POA: Diagnosis not present

## 2011-10-05 ENCOUNTER — Encounter: Payer: Self-pay | Admitting: Pulmonary Disease

## 2011-10-05 NOTE — Progress Notes (Signed)
Subjective:    Patient ID: [name], female    DOB: August 26, 1935, 76 y.o.   MRN: [id number]  HPI 76 y/o WF here for a follow up visit... she has multiple medical problems including AR & Asthma;  HBP;  CAD followed by Walker Kehr & s/p 3 vessel CABG 11/11 by DrGearhardt;  Hyperlipidemia followed in the [facility];  GERD/ IBS/ Divertics;  Breast Cancer diagnosed 12/12;  Fibromyalgia;  Chr HAs & dizziness...  ~  May 04, 2009:  presents w/ HA- posterior pain> band-like c/w muscle contraction HA... she under some stress dealing w/ father's estate... she went to the ER w/ CP & elevated BP> she ruled out for MI & was started on HYZAAR 100-12.5 by DrNishan in f/u... she has a repeat Myoview pending next week (it was neg)... she continues her Lipid Management per [facility] on FishOil, FlaxSeedOil, & diet...  she had f/u colonoscopy 3/11 by DrPatterson> divertics,1 polyp= tubular adenoma, f/u 24yrs...   ~  Jul 25, 2009:  she's noted some incr SOB, wheezing over the last 2 weeks w/ the heat... we discussed using the Symbicort, Proair, etc... she saw Walker Kehr 5/11- doing well, stopped her Plavix, no angina & non-ischemic Myoview 3/11... BP controlled on meds;  continues on FishOil & FlaxSeesOil for her Chol (intol to all other meds) followed by the clinic but FLP remains deranged w/ TChol 277 & LDL 193;  we will give her a TDAP today...  ~  May 08, 2010:  [name] has had a busy interval hx> epis of CP, cath w/ in-stent restenosis, then CABG x3 11/11 by DrGearhardt... now c/o refractory URI x 6wks w/ sinusitis/ bronchitis/ laryngitis & rx w/ ZPak, Biaxin, etc from DrESL "I'm allergic" she says... finally received Avelox/ Medrol & improved...     AR/ Asthma>  DrESL stopped her Symbicort in favor of QVar (she didn't like it & returned to [place])...    HBP>  controlled on Toprol & Hyzaar w/ BP 130/70 today... denies visual changes, CP, palipit, dizziness, syncope, dyspnea, edema, etc...     CAD>  s/p CABG as noted  & saw both DrNishan & DrGearhardt 12/11 (notes reviewed)...    Hyperlipid>  followed in the [facility] & FLP 2/12 reviewed on Cres10 2d per week...    Others>  she has persistant FM symptoms and chr HAs, uses Vicodin Prn...  ~  Sep 26, 2010:  5mo ROV & she was re-hospitalized 6/12 by [facility] for CP> cath showed severe 3 vessel CAD w/ 3/3 grafts patent & norm LVF w/ EF= 65-70%; no change in meds; she states "they don't know what it was"; she saw [place] 7/12 in office f/u & he noted Rx limited by side effects, continued same regimen...  Her CC remains various aches & pains, notes breathing is good w/o cough, sputum, SOB, etc;  Notes GI w/u DrPatterson prev w/ Gallstones...  ~  April 02, 2011:  32mo ROV & [name] has had another event filled interval> this time w/ mammogram showing a left breast nodule & surg (partial mastectomy & sentinel node bx) by [name] 12/12 that proved to be an invasive ductal carcinoma, 2.3cm size, w/ neg lymphovasc invasion, clear margins (but close at 1mm), & neg nodes;  subseq XRT by DrKindard (had her 11th treatment today); and Oncology eval by DrMagrinat who plans hormone Rx later (no chemo she says)... She is tolerating treatment well & spirits are up, [name] had to aspirate a seroma & it is now resolved.Marland KitchenMarland Kitchen  Pat had her usual autumn URI, AB exac in Nov RX w/ ZPak, Pred, Mucinex, Hydromet but reacted to the hydrocodone & prefers OTC non-narcotic cough suppression & we discussed MUCINEX DM & DELSYM (offered Tussionex if ineffective);  She had the Flu vaccine 10/12...     She denies CP palpit, dizzy, syncope, incr SOB, edema, etc; Cardiac stable & she is thrilled w/ that...    She continues f/u in the [facility] & has been titrated up to Cres10mg  M&F, plus 5mg  on Wednes; last FLP 10/12 was the best yet w/ TChol 167, TG 203, HDL 54, LDL 88...   ~  October 01, 2011:  8mo ROV & [name] persists w/ mult somatic complaints> eg. Cramp under left breast ("knots up", thinks it's a hernia);  intermittent right arm numbness & in the middle 3 fingers; she has chr fibromyalgia & uses Tramadol/ Tylenol as needed...    She saw DrVanWinkle 7/13 for her allergies> on allergy shots weekly, Xopenex inhaler as needed, Allegra prn, Nasonex daily and doing well...    She saw [facility] 6/13 w/ known CAD, s/p CABG & intermittent CWP likely related to her FM; neg stress test- no ischemia, good LVF; she is walking some & notes "pressure" on hills...    She had f/u DrMagrinat 6/13 & he wondered if her systemic symptoms might not be related to Femara Rx...    We reviewed prob list, meds, xrays and labs> see below for updates >> requests Shingles vaccine Rx (lost prev Rx)... CXR 5/13 showed s/p CABG, clear lungs, NAD...  LABS 6/13:  Chems- ok;  CBC- wnl;  Ca27.29=20...          Problem List:  GLAUCOMA (ICD-365.9) - she is sched for right cataract surg by DrBevis in Feb...  ALLERGIC RHINITIS (ICD-477.9) - she uses Allegra & Nasonex Prn; on Allergy shots weekly x yrs...  ASTHMA & Asthmatic Bronchitis >> off Symbicort & using XOPENEX HFA as needed rescue inhaler... ~  5/11:  notes incr chest symptoms in the heat & rec to take the Symbicort 2sp Bid... ~  2012:  After her CABG & repeat hosp by [facility] she is noted to be off her inhalers... ~  10/12:  Treated w/ Zpak, Pred, Mucinex, plus her Symbicort80 & improved... ~  CXR 5/13 showed s/p CABG, clear lungs, NAD (she went to the Er w/ cough & had neg eval). ~  7/13:  DrVanWinkle stopped her Symbicort & switched to [facility] HFA as needed...  HYPERTENSION (ICD-401.9) - controlled on TOPROL 50mg Bid & HYZAAR 100-12.5 daily (off Lisinopril due to ACE cough)...  ~  8/12: BP=  128/84 & tol meds well; denies HA, fatigue, visual changes, CP, palipit, syncope, dyspnea, edema, etc... ~  2/13: BP= 140/64 & she remains essentially asymptomatic on Rx... ~  8/13:  BP= 132/64 & she denies CP, palpit, SOB, edema, etc...  CORONARY ARTERY DISEASE (ICD-414.00) - on ASA 81mg /d  (intol to 325 she says) & off Plavix per Walker Kehr...  Cardiolite 9/08 showed CP & HBP response, EF+85%... had cath 10/08 w/ single vessel dis w/ mod ostial LAD stenosis and patent LAD stent, normal LVF.[name]. option for med Rx vs off pump LIMA... she notes some CP/ pressure "burning" when walking up a certain hill during her daily walks, but states this is stable and "no worse"... ~  Myoview 9/09 was neg- no scar or ischemia, EF= 89%, hypertensive response- Lisinopril10 added. ~  f/u Myoview 3/11 was neg- no scar or ischemia, EF=  86%, norm BP response, fair exerc capacity. ~  saw Walker Kehr 5/11- no CP, he stopped her Plavix, stable- continue other meds same. ~  She had recurrent CP 11/11 w/ in-stent restenosis on cath> subseq 3 vessel CABG by DrGearhart & doing well since then... ~  6/12:  Hosp w/ CP & repeat cath showed CAD & 3/3 grafts patent, EF= 65-70% ~  Myoview 5/13 was felt to be a normal stress nuclear study- normal wall motion, no ischemia, EF=84%  HYPERLIPIDEMIA (ICD-272.4) - on CRESTOR 10mg - 1/2 on M&F, FISH OIL 1200mg  Tid + Flax Seed Oil... she has been intol to statins in the past>  tried low dose Trilipix but stopped this due to "thigh burning">  now followed in the [facility]--- ~  FLP 7/08 showed TChol 217, TG 236, HDL 46, LDL 150...  ~  FLP 6/09 showed TChol 233, TG 263, HDL 46, LDL 150... she agrees to try LOW DOSE Trilipix. ~  FLP 11/09 showed TChol 162, TG 167, HDL 45, LDL 83... ~  FLP 11/10 showed TChol 240, TG 271, HDL 45, LDL 150... [facility] Rx w/ FishOil & FlaxSeedOil. ~  FLP 2/11 showed TChol 285, TG 156, HDL 79, LDL 180... LC is aware & working w/ her regularly. ~  FLP 5/11 showed TChol 277, TG 241, HDL55, LDL 193...  LC added Cres10 just 2d per week. ~  FLP 10/11 showed TChol 198, TG 215, HDL 53, LDL 109... much improved on Cres10 twice weekly. ~  She continues to f/u w/ Lipid clinic & they titrated up the Cres10 on M&F, plus 5mg  on Wednes> ~  FLP  10/12 was the best yet w/  TChol 167, TG 203, HDL 54, LDL 88...  ~  FLP 4/13 showed TChol 193, TG 176, HDL 57, LDL 101 ~  She is still followed in the [facility]- note 6/13 reviewed (at that time on Cres10 on M&F, 5mg  on W) but she subseq cut herself down to 1/2 on M&F due to nausea & aching.  GERD (ICD-530.81) - on OMEPRAZOLE 40mg /d... last EGD by DrPatterson 8/07 revealed sm HH, & gastritis and dilatation done... (RUT=neg, PPI Rx)... LEVSIN 0.125mg  helps her esoph spasm... Prn Phenergan for nausea.  DIVERTICULOSIS, COLON (ICD-562.10) & IRRITABLE BOWEL SYNDROME (ICD-564.1) ~  colonoscopy 2/03 by DrPatterson showed divertics and 5 mm polyp... ~  colonoscopy 3/11 showed divertics & cecal polyp= tubular adenoma, w/ f/u planned 64yrs.  GALLSTONES>>  Diagnosed by DrPatterson and referred to [facility] DrWilson, pt asked to call prn fopr incr symptoms to consider surg...  BREAST CANCER >> Abn mammogram 10/12 showing a left breast nodule & subseq surg (partial mastectomy & sentinel node bx) by [name] 12/12 that proved to be an invasive ductal carcinoma, 2.3cm size, w/ neg lymphovasc invasion, clear margins (but close at 1mm), & neg nodes; she had XRT by DrKindard (had her 11th treatment today); and Oncology eval by DrMagrinat who plans hormone Rx later (no chemo she says)...  ~  2/13: She is tolerating treatment well & spirits are up, [name] had to aspirate a seroma & it is now resolved... ~  She is followed by DrMagrinat, Streck, Kinard- now on [facility] 2.5mg /d...  FIBROMYALGIA (ICD-729.1) - she c/o chr fatigue, aching/ sore, on Tramadol & Tylenol, but most benefit from low dose Hydrocodone ~1/2 tab Prn... I have recommended an increase exercise program to her... ~  5/11:  Vit D level = 50 on 50000 u weekly by Rock Nephew, NP  HEADACHE (ICD-784.0) - eval  at [facility] clinic in 2002, but she notes Rx didn't help... ~  3/11:  presented w/ muscle contraction HA's> Rx Tramadol 50mg  Q6H Prn.  DIZZINESS, CHRONIC (ICD-780.4) - MRI 2/09 per  Walker Kehr showed atrophy, sm vessel dis, NAD...  Health Maintenance: ~  GI:  DrPatterson & up to date on colon screening. ~  GYN:  yearly f/u w/ Rock Nephew, NP for PAP, Mammogram [facility], ?last BMD. ~  Immunizations:  yearly Flu vaccines in fall, Pneumovax in 2007?, TDAP given 5/11...   Past Surgical History  Procedure Date  . Coronary angioplasty with stent placement     Stent 2007  . Open heart surgery     01/19/2010  . Coronary artery bypass graft   . Eye surgery     bilateral cataract removal,  . Breast mass excision 02/06/11    left  . Left lumpectomy 02/06/2011    Outpatient Encounter Prescriptions as of 10/01/2011  Medication Sig Dispense Refill  . aspirin 81 MG tablet Take 81 mg by mouth daily.        . Coenzyme Q10 (COQ10) 100 MG CAPS Take 1 capsule by mouth daily.        . fexofenadine (ALLEGRA) 180 MG tablet Take 180 mg by mouth daily.        . Glucosamine-Chondroit-Vit C-Mn (GLUCOSAMINE CHONDR 1500 COMPLX PO) Take 1 tablet by mouth 3 (three) times daily.        [name] letrozole (FEMARA) 2.5 MG tablet Take 2.5 mg by mouth daily.      [name] losartan-hydrochlorothiazide (HYZAAR) 100-12.5 MG per tablet take 1 tablet by mouth once daily  30 tablet  12  . metoprolol (LOPRESSOR) 50 MG tablet take 1 tablet by mouth twice a day  60 tablet  6  . mometasone (NASONEX) 50 MCG/ACT nasal spray 2 sprays by Nasal route daily.        . nitroGLYCERIN (NITROSTAT) 0.4 MG SL tablet Take one tablet  By mouth SL every 5 minutes for a total of 3 doses as needed for chest pain.  25 tablet  3  . Olopatadine HCl (PATANASE) 0.6 % SOLN Place 2 puffs into the nose 2 (two) times daily.      . Omega-3 Fatty Acids (FISH OIL) 1200 MG CAPS 2 tabs po bid       . omeprazole (PRILOSEC) 40 MG capsule take 1 capsule by mouth once daily 30 MINUTES BEFORE THE FIRST MEAL OF THE DAY  30 capsule  6  . rosuvastatin (CRESTOR) 10 MG tablet Take 1 tablet (10 mg total) by mouth daily. 1 tab po daily( except on Tuesday and  Thursday no tabs )  and on Wednesday 1/2 tab daily  30 tablet  6  . traMADol (ULTRAM) 50 MG tablet Take 50 mg by mouth as needed. For pain      . travoprost, benzalkonium, (TRAVATAN) 0.004 % ophthalmic solution Place 1 drop into both eyes at bedtime.       . vitamin B-12 (CYANOCOBALAMIN) 1000 MCG tablet Take 1,000 mcg by mouth daily.        . Vitamin D, Ergocalciferol, (DRISDOL) 50000 UNITS CAPS Take 50,000 Units by mouth. Every other week      . DISCONTD: budesonide-formoterol (SYMBICORT) 80-4.5 MCG/ACT inhaler Inhale 2 puffs into the lungs as needed.         Allergies  Allergen Reactions  . Choline Fenofibrate Other (See Comments)    REACTION: pt states INTOL to Trilipix w/ "thigh burning"  .  Simvastatin Other (See Comments)    REACTION: pt states INTOL to STATINS \\T \ refuses to restart  . Hydrocodone     Nightmare after taking cough syrup w/hydrocodone  . Letrozole     Makes patient hurt  . Adhesive (Tape) Rash  . Ceclor (Cefaclor) Rash  . Clarithromycin Rash  . Codeine Nausea Only  . Doxycycline Rash  . Lisinopril Cough    Developed ACE cough...  . Penicillins Itching and Rash    At injection site  . Tobramycin-Dexamethasone Rash    Current Medications, Allergies, Past Medical History, Past Surgical History, Family History, and Social History were reviewed in [employer] record.    Review of Systems    See HPI - all other systems neg except as noted...  The patient complains of chest pain and headaches.  The patient denies anorexia, fever, weight loss, weight gain, vision loss, decreased hearing, hoarseness, syncope, dyspnea on exertion, peripheral edema, prolonged cough, hemoptysis, abdominal pain, melena, hematochezia, severe indigestion/heartburn, hematuria, incontinence, muscle weakness, suspicious skin lesions, transient blindness, difficulty walking, depression, unusual weight change, abnormal bleeding, enlarged lymph nodes, and  angioedema.   Objective:   Physical Exam    WD, WN, 76 y/o WF in NAD... GENERAL:  Alert & oriented; pleasant & cooperative... HEENT:  [place]/AT, EOM-wnl, PERRLA, EACs-clear, TMs-wnl, NOSE-clear, THROAT-clear & wnl. NECK:  Supple w/ fair ROM; no JVD; normal carotid impulses w/o bruits; no thyromegaly or nodules palpated; no lymphadenopathy. CHEST:  Clear to P & A; without wheezes/ rales/ or rhonchi heard... chest wall is tender & reproduces her pain... HEART:  Regular Rhythm; without murmurs/ rubs/ or gallops detected... ABDOMEN:  Soft & nontender; normal bowel sounds; no organomegaly or masses palpated... EXT: without deformities, mild arthritic changes and +trigger points, no varicose veins/ venous insuffic/ or edema. NEURO:  CN's intact; motor testing normal; sensory testing normal; gait normal & balance OK. DERM:  No lesions noted; no rash etc...  RADIOLOGY DATA:  Reviewed in the EPIC EMR & discussed w/ the patient...  LABORATORY DATA:  Reviewed in the EPIC EMR & discussed w/ the patient...   Assessment & Plan:   BREAST CANCER>  SEE ABOVE, on FEMARA now> treated by [name], DrMagrinat, DrKinard & records reviewed...  Asthma/ AR>  Stable following her URI exac in 10/12; on allergy shots & XOPENEX as needed...  HBP>  Controlled on Toprol & Hyzaar, continue same...  CAD>  Cath w/ patent grafts; had neg Nuclear study; continue same meds and f/u DrNishan...  CHOL>  Followed in the [facility] Clinic (on Cres intermittently) & FLP improved...  GI> GERD, Divertics, Gallstones>  Followed by [employer] & stones eval by DrWilson [facility]; continue meds...  FM/ HAs/ etc>  On Ultram, Tylenol, etc...  Other medical issues as noted> on CoQ10, Glucosamine, Vit B12, Vit D, etc...   Patient's Medications  New Prescriptions   No medications on file  Previous Medications   ASPIRIN 81 MG TABLET    Take 81 mg by mouth daily.     COENZYME Q10 (COQ10) 100 MG CAPS    Take 1 capsule by mouth daily.      FEXOFENADINE (ALLEGRA) 180 MG TABLET    Take 180 mg by mouth daily.     GLUCOSAMINE-CHONDROIT-VIT C-MN (GLUCOSAMINE CHONDR 1500 COMPLX PO)    Take 1 tablet by mouth 3 (three) times daily.     LETROZOLE (FEMARA) 2.5 MG TABLET    Take 2.5 mg by mouth daily.   LOSARTAN-HYDROCHLOROTHIAZIDE (HYZAAR) 100-12.5 MG  PER TABLET    take 1 tablet by mouth once daily   METOPROLOL (LOPRESSOR) 50 MG TABLET    take 1 tablet by mouth twice a day   MOMETASONE (NASONEX) 50 MCG/ACT NASAL SPRAY    2 sprays by Nasal route daily.     NITROGLYCERIN (NITROSTAT) 0.4 MG SL TABLET    Take one tablet  By mouth SL every 5 minutes for a total of 3 doses as needed for chest pain.   OLOPATADINE HCL (PATANASE) 0.6 % SOLN    Place 2 puffs into the nose 2 (two) times daily.   OMEGA-3 FATTY ACIDS (FISH OIL) 1200 MG CAPS    2 tabs po bid    OMEPRAZOLE (PRILOSEC) 40 MG CAPSULE    take 1 capsule by mouth once daily 30 MINUTES BEFORE THE FIRST MEAL OF THE DAY   ROSUVASTATIN (CRESTOR) 10 MG TABLET    Take 1 tablet (10 mg total) by mouth daily. 1 tab po daily( except on Tuesday and Thursday no tabs )  and on Wednesday 1/2 tab daily   TRAMADOL (ULTRAM) 50 MG TABLET    Take 50 mg by mouth as needed. For pain   TRAVOPROST, BENZALKONIUM, (TRAVATAN) 0.004 % OPHTHALMIC SOLUTION    Place 1 drop into both eyes at bedtime.    VITAMIN B-12 (CYANOCOBALAMIN) 1000 MCG TABLET    Take 1,000 mcg by mouth daily.     VITAMIN D, ERGOCALCIFEROL, (DRISDOL) 50000 UNITS CAPS    Take 50,000 Units by mouth. Every other week  Modified Medications   No medications on file  Discontinued Medications   BUDESONIDE-FORMOTEROL (SYMBICORT) 80-4.5 MCG/ACT INHALER    Inhale 2 puffs into the lungs as needed.

## 2011-10-15 DIAGNOSIS — M5412 Radiculopathy, cervical region: Secondary | ICD-10-CM | POA: Diagnosis not present

## 2011-10-15 DIAGNOSIS — M5030 Other cervical disc degeneration, unspecified cervical region: Secondary | ICD-10-CM | POA: Diagnosis not present

## 2011-10-15 DIAGNOSIS — M9981 Other biomechanical lesions of cervical region: Secondary | ICD-10-CM | POA: Diagnosis not present

## 2011-10-30 ENCOUNTER — Encounter (INDEPENDENT_AMBULATORY_CARE_PROVIDER_SITE_OTHER): Payer: Self-pay | Admitting: Surgery

## 2011-10-30 ENCOUNTER — Ambulatory Visit (INDEPENDENT_AMBULATORY_CARE_PROVIDER_SITE_OTHER): Payer: Medicare Other | Admitting: Surgery

## 2011-10-30 VITALS — BP 138/62 | HR 72 | Temp 97.6°F | Resp 20 | Ht <= 58 in | Wt 133.4 lb

## 2011-10-30 DIAGNOSIS — Z853 Personal history of malignant neoplasm of breast: Secondary | ICD-10-CM

## 2011-10-30 DIAGNOSIS — J309 Allergic rhinitis, unspecified: Secondary | ICD-10-CM | POA: Diagnosis not present

## 2011-10-30 NOTE — Progress Notes (Signed)
NAME: [name]       DOB: [birth year]           DATE: 10/30/2011       MRN: [id number]   [name] is a 76 y.o.Marland Kitchenfemale who presents for routine followup of her Left breast cancer, lower outer quadrant diagnosed in 2012 and treated with Lumpectomy, radiation and now letrozole. She has no problems or concerns on either side.Still a little tender on side of cancer (left)  PFSH: She has had no significant changes since the last visit here.  ROS: There have been no significant changes since the last visit here  EXAM: General: The patient is alert, oriented, generally healthy appearing, NAD. Mood and affect are normal.  Breasts:  Right is normal. Left shows changes of recent radiation with some edema and darkening of the skin. The lumpectomy site is soft.  Lymphatics: She has no axillary or supraclavicular adenopathy on either side.  Extremities: Full ROM of the surgical side with no lymphedema noted.  Data Reviewed: No new data, Mammogram due in October  Impression: Doing well, with no evidence of recurrent cancer or new cancer  Plan: RTC12 months

## 2011-10-30 NOTE — Patient Instructions (Signed)
See me again in year

## 2011-10-31 ENCOUNTER — Telehealth: Payer: Self-pay | Admitting: Gastroenterology

## 2011-10-31 NOTE — Telephone Encounter (Signed)
Pt reports lower abdominal pain that began last night. The pain is described as cramping and is intermittent across her entire lower abdomen, not one particular side. The pain is worse when she walks and she has to ease herself down when she needs to sit down. She had hyoscyamine but it is out of date. Her appetite is decreased and she has a full feeling, but she has tried to stay hydrated by increasing her water. She denies diarrhea, but states her stools range from soft to more firm and she feels great pressure when she needs to have a BM; no visible blood per pt. Last COLON was 04/26/09 showing moderate diverticulosis and benign polyps. Pt given an appt with Mike Gip, PA tomorrow at 0830am; pt stated understanding.

## 2011-11-01 ENCOUNTER — Ambulatory Visit (INDEPENDENT_AMBULATORY_CARE_PROVIDER_SITE_OTHER): Payer: Medicare Other | Admitting: Physician Assistant

## 2011-11-01 ENCOUNTER — Encounter: Payer: Self-pay | Admitting: Physician Assistant

## 2011-11-01 VITALS — BP 110/50 | HR 68 | Ht 58.25 in | Wt 133.0 lb

## 2011-11-01 DIAGNOSIS — C50919 Malignant neoplasm of unspecified site of unspecified female breast: Secondary | ICD-10-CM

## 2011-11-01 DIAGNOSIS — K5792 Diverticulitis of intestine, part unspecified, without perforation or abscess without bleeding: Secondary | ICD-10-CM

## 2011-11-01 DIAGNOSIS — K5732 Diverticulitis of large intestine without perforation or abscess without bleeding: Secondary | ICD-10-CM | POA: Diagnosis not present

## 2011-11-01 MED ORDER — CIPROFLOXACIN HCL 500 MG PO TABS: 500.0000 mg | ORAL_TABLET | Freq: Two times a day (BID) | ORAL | Status: AC

## 2011-11-01 MED ORDER — HYOSCYAMINE SULFATE 0.125 MG PO TBDP: 0.1250 mg | ORAL_TABLET | Freq: Four times a day (QID) | ORAL | Status: DC | PRN

## 2011-11-01 MED ORDER — METRONIDAZOLE 250 MG PO TABS: 250.0000 mg | ORAL_TABLET | Freq: Two times a day (BID) | ORAL | Status: AC

## 2011-11-01 NOTE — Progress Notes (Addendum)
Subjective:    Patient ID: [name], female    DOB: August 11, 1935, 76 y.o.   MRN: [id number]  HPI [name] is a very nice 76 year old white female known to Dr. Jarold Motto. She last had colonoscopy in 2011 at that time had moderate diverticulosis of the sigmoid to descending colon and one diminutive polyp in the cecum which was adenomatous. Unfortunately he over this past year she was diagnosed with breast cancer and has undergone treatment with lumpectomy and has now completed  radiation and says she's doing very well. Patient says she had started having some twinges of discomfort in her lower abdomen last week but now over the past 3 days has had somewhat progressive and constant lower abdominal discomfort which seems to be worse with walking and sitting. She describes it as a yes or no 7 aching across her lower abdomen.She has had diverticulitis before and says this feels very similar. Her last episode of diverticulitis with several years ago. She has had some nausea this morning but no vomiting. No fever chills or sweats. She says her stools are soft and snare oh she has not noted any melena or hematochezia. She says she has recently been eating a lot of food out of garden particularly corn tomatoes. She denies any dysuria urgency or frequency.    Review of Systems  Unable to perform ROS Constitutional: Positive for appetite change.  HENT: Negative.   Eyes: Negative.   Respiratory: Negative.   Cardiovascular: Negative.   Gastrointestinal: Positive for abdominal pain.  Genitourinary: Negative.   Musculoskeletal: Negative.   Neurological: Negative.   Hematological: Negative.   Psychiatric/Behavioral: Negative.    Outpatient Prescriptions Prior to Visit  Medication Sig Dispense Refill  . aspirin 81 MG tablet Take 81 mg by mouth daily.        . Coenzyme Q10 (COQ10) 100 MG CAPS Take 1 capsule by mouth daily.        . fexofenadine (ALLEGRA) 180 MG tablet Take 180 mg by mouth daily.         . Glucosamine-Chondroit-Vit C-Mn (GLUCOSAMINE CHONDR 1500 COMPLX PO) Take 1 tablet by mouth 3 (three) times daily.        [name] letrozole (FEMARA) 2.5 MG tablet Take 2.5 mg by mouth daily.      [name] losartan-hydrochlorothiazide (HYZAAR) 100-12.5 MG per tablet take 1 tablet by mouth once daily  30 tablet  12  . metoprolol (LOPRESSOR) 50 MG tablet take 1 tablet by mouth twice a day  60 tablet  6  . mometasone (NASONEX) 50 MCG/ACT nasal spray 2 sprays by Nasal route daily.        . nitroGLYCERIN (NITROSTAT) 0.4 MG SL tablet Take one tablet  By mouth SL every 5 minutes for a total of 3 doses as needed for chest pain.  25 tablet  3  . Olopatadine HCl (PATANASE) 0.6 % SOLN Place 2 puffs into the nose 2 (two) times daily.      . Omega-3 Fatty Acids (FISH OIL) 1200 MG CAPS 2 tabs po bid       . omeprazole (PRILOSEC) 40 MG capsule take 1 capsule by mouth once daily 30 MINUTES BEFORE THE FIRST MEAL OF THE DAY  30 capsule  6  . rosuvastatin (CRESTOR) 10 MG tablet Take 1 tablet (10 mg total) by mouth daily. 1 tab po daily( except on Tuesday and Thursday no tabs )  and on Wednesday 1/2 tab daily  30 tablet  6  . traMADol (ULTRAM) 50  MG tablet Take 50 mg by mouth as needed. For pain      . travoprost, benzalkonium, (TRAVATAN) 0.004 % ophthalmic solution Place 1 drop into both eyes at bedtime.       . vitamin B-12 (CYANOCOBALAMIN) 1000 MCG tablet Take 1,000 mcg by mouth daily.        . Vitamin D, Ergocalciferol, (DRISDOL) 50000 UNITS CAPS Take 50,000 Units by mouth. Every other week       Allergies  Allergen Reactions  . Choline Fenofibrate Other (See Comments)    REACTION: pt states INTOL to Trilipix w/ "thigh burning"  . Simvastatin Other (See Comments)    REACTION: pt states INTOL to STATINS \\T \ refuses to restart  . Hydrocodone     Nightmare after taking cough syrup w/hydrocodone  . Letrozole     Makes patient hurt  . Adhesive (Tape) Rash  . Ceclor (Cefaclor) Rash  . Clarithromycin Rash  . Codeine  Nausea Only  . Doxycycline Rash  . Lisinopril Cough    Developed ACE cough...  . Penicillins Itching and Rash    At injection site  . Tobramycin-Dexamethasone Rash   Patient Active Problem List  Diagnosis  . HYPERLIPIDEMIA  . GLAUCOMA  . HYPERTENSION  . CORONARY ARTERY DISEASE  . ALLERGIC RHINITIS  . ASTHMA  . GERD  . GASTRITIS  . HIATAL HERNIA  . DIVERTICULOSIS, COLON  . IRRITABLE BOWEL SYNDROME  . FATTY LIVER DISEASE  . CHOLELITHIASIS  . FIBROMYALGIA  . DIZZINESS, CHRONIC  . HEADACHE  . CHEST WALL PAIN, ACUTE  . COLONIC POLYPS, HX OF  . Rash  . Leg pain  . Acute cystitis  . Breast cancer, Left  . Midsternal chest pain   History   Social History  . Marital Status: Married    Spouse Name: Lorin Picket. Tinkle    Number of Children: 2  . Years of Education: N/A   Occupational History  . part time preschool teacher-retired    Social History Main Topics  . Smoking status: Never Smoker   . Smokeless tobacco: Never Used  . Alcohol Use: No  . Drug Use: No  . Sexually Active: Not on file   Other Topics Concern  . Not on file   Social History Narrative   4 granddaughters       Objective:   Physical Exam well-developed older white female in no acute distress, pleasant blood pressure 110/50 pulse 60. HEENT; nontraumatic normocephalic EOMI PERRLA sclera anicteric, Neck ;supple no JVD, Cardiovascular; regular rate and rhythm with S1-S2 no murmur or gallop, Pulmonary; clear bilaterally, Abdomen; soft bowel sounds are active no palpable mass or hepatosplenomegaly she is tender in the left lower quadrant and across the suprapubic area no guarding no rebound., Rectal; not done, Extremities; no clubbing cyanosis or edema skin warm and dry, Psych mood and affect normal and appropriate.        Assessment & Plan:  #69 76 year old female with acute diverticulitis. #2 history of adenomatous colon polyps-colonoscopy 2011 due for followup 2016 #3 history of IBS #4 left  breast cancer #5 coronary artery disease #6 fibromyalgia #7 asthma  Plan; patient will use Tylenol as needed for pain Will refill her Levsin for when necessary use Start Cipro 500 mg by mouth twice daily x14 days Start Flagyl 250 by mouth twice daily x14 days with food We discussed soft bland small feedings over the next few days until her pain improved Patient is asked to call if her symptoms worsen at  any point and/or if she is not significantly better within the next 5 days. At that point would proceed with CT of the abdomen and pelvis. She will followup with Dr. Jarold Motto as needed.  Addendum: Reviewed and agree with initial management. Beverley Fiedler, MD

## 2011-11-01 NOTE — Patient Instructions (Addendum)
We sent prescriptions for the Hyoscyamine, Cipro and the Metronidazole to [facility] [address]. Take Tylenol for pain as needed. Eat small frequent meals. Call us back if your symptoms worsen or you are not getting better.

## 2011-11-05 DIAGNOSIS — M5030 Other cervical disc degeneration, unspecified cervical region: Secondary | ICD-10-CM | POA: Diagnosis not present

## 2011-11-05 DIAGNOSIS — M545 Low back pain: Secondary | ICD-10-CM | POA: Diagnosis not present

## 2011-11-05 DIAGNOSIS — M9981 Other biomechanical lesions of cervical region: Secondary | ICD-10-CM | POA: Diagnosis not present

## 2011-11-05 DIAGNOSIS — M5412 Radiculopathy, cervical region: Secondary | ICD-10-CM | POA: Diagnosis not present

## 2011-11-06 ENCOUNTER — Other Ambulatory Visit: Payer: Self-pay | Admitting: *Deleted

## 2011-11-06 DIAGNOSIS — C50519 Malignant neoplasm of lower-outer quadrant of unspecified female breast: Secondary | ICD-10-CM

## 2011-11-07 ENCOUNTER — Encounter (HOSPITAL_BASED_OUTPATIENT_CLINIC_OR_DEPARTMENT_OTHER): Payer: Self-pay | Admitting: Emergency Medicine

## 2011-11-07 ENCOUNTER — Telehealth: Payer: Self-pay | Admitting: *Deleted

## 2011-11-07 ENCOUNTER — Emergency Department (HOSPITAL_BASED_OUTPATIENT_CLINIC_OR_DEPARTMENT_OTHER): Payer: Medicare Other

## 2011-11-07 ENCOUNTER — Encounter: Payer: Self-pay | Admitting: Physician Assistant

## 2011-11-07 ENCOUNTER — Emergency Department (HOSPITAL_BASED_OUTPATIENT_CLINIC_OR_DEPARTMENT_OTHER)
Admission: EM | Admit: 2011-11-07 | Discharge: 2011-11-07 | Disposition: A | Discharge status: Home / Self Care | Payer: Medicare Other | Attending: Emergency Medicine | Admitting: Emergency Medicine

## 2011-11-07 ENCOUNTER — Other Ambulatory Visit (HOSPITAL_BASED_OUTPATIENT_CLINIC_OR_DEPARTMENT_OTHER): Payer: Medicare Other | Admitting: Lab

## 2011-11-07 ENCOUNTER — Ambulatory Visit (HOSPITAL_BASED_OUTPATIENT_CLINIC_OR_DEPARTMENT_OTHER): Payer: Medicare Other | Admitting: Physician Assistant

## 2011-11-07 VITALS — BP 123/72 | HR 67 | Temp 98.5°F | Resp 20 | Ht <= 58 in | Wt 132.2 lb

## 2011-11-07 DIAGNOSIS — K7689 Other specified diseases of liver: Secondary | ICD-10-CM | POA: Diagnosis not present

## 2011-11-07 DIAGNOSIS — Z7982 Long term (current) use of aspirin: Secondary | ICD-10-CM | POA: Insufficient documentation

## 2011-11-07 DIAGNOSIS — R110 Nausea: Secondary | ICD-10-CM | POA: Diagnosis not present

## 2011-11-07 DIAGNOSIS — I2510 Atherosclerotic heart disease of native coronary artery without angina pectoris: Secondary | ICD-10-CM | POA: Insufficient documentation

## 2011-11-07 DIAGNOSIS — Z170 Estrogen receptor positive status [ER+]: Secondary | ICD-10-CM

## 2011-11-07 DIAGNOSIS — R197 Diarrhea, unspecified: Secondary | ICD-10-CM | POA: Diagnosis not present

## 2011-11-07 DIAGNOSIS — K589 Irritable bowel syndrome without diarrhea: Secondary | ICD-10-CM | POA: Insufficient documentation

## 2011-11-07 DIAGNOSIS — J45909 Unspecified asthma, uncomplicated: Secondary | ICD-10-CM | POA: Diagnosis not present

## 2011-11-07 DIAGNOSIS — C50519 Malignant neoplasm of lower-outer quadrant of unspecified female breast: Secondary | ICD-10-CM

## 2011-11-07 DIAGNOSIS — I10 Essential (primary) hypertension: Secondary | ICD-10-CM | POA: Diagnosis not present

## 2011-11-07 DIAGNOSIS — Z79899 Other long term (current) drug therapy: Secondary | ICD-10-CM | POA: Diagnosis not present

## 2011-11-07 DIAGNOSIS — H409 Unspecified glaucoma: Secondary | ICD-10-CM | POA: Diagnosis not present

## 2011-11-07 DIAGNOSIS — K5730 Diverticulosis of large intestine without perforation or abscess without bleeding: Secondary | ICD-10-CM | POA: Diagnosis not present

## 2011-11-07 DIAGNOSIS — Z853 Personal history of malignant neoplasm of breast: Secondary | ICD-10-CM | POA: Insufficient documentation

## 2011-11-07 DIAGNOSIS — R51 Headache: Secondary | ICD-10-CM | POA: Insufficient documentation

## 2011-11-07 DIAGNOSIS — G319 Degenerative disease of nervous system, unspecified: Secondary | ICD-10-CM | POA: Diagnosis not present

## 2011-11-07 DIAGNOSIS — J309 Allergic rhinitis, unspecified: Secondary | ICD-10-CM | POA: Diagnosis not present

## 2011-11-07 LAB — CBC WITH DIFFERENTIAL/PLATELET
BASO%: 0.8 % (ref 0.0–2.0)
Eosinophils Absolute: 0.1 10*3/uL (ref 0.0–0.5)
HCT: 36.9 % (ref 34.8–46.6)
HGB: 12.4 g/dL (ref 11.6–15.9)
Hemoglobin: 13 g/dL (ref 12.0–15.0)
Lymphocytes Relative: 44 % (ref 12–46)
Lymphs Abs: 3.3 10*3/uL (ref 0.7–4.0)
MCHC: 33.6 g/dL (ref 31.5–36.0)
MCV: 85.7 fL (ref 78.0–100.0)
MONO#: 0.5 10*3/uL (ref 0.1–0.9)
Monocytes Relative: 11 % (ref 3–12)
NEUT#: 2.1 10*3/uL (ref 1.5–6.5)
NEUT%: 42.4 % (ref 38.4–76.8)
Neutrophils Relative %: 40 % — ABNORMAL LOW (ref 43–77)
Platelets: 320 10*3/uL (ref 150–400)
RBC: 4.34 MIL/uL (ref 3.87–5.11)
WBC: 7.4 10*3/uL (ref 4.0–10.5)
WBC: 5 10*3/uL (ref 3.9–10.3)
lymph#: 2.2 10*3/uL (ref 0.9–3.3)

## 2011-11-07 LAB — COMPREHENSIVE METABOLIC PANEL (CC13)
ALT: 33 U/L (ref 0–55)
CO2: 27 mEq/L (ref 22–29)
Calcium: 9.6 mg/dL (ref 8.4–10.4)
Chloride: 96 mEq/L — ABNORMAL LOW (ref 98–107)
Creatinine: 0.8 mg/dL (ref 0.6–1.1)
Total Protein: 7.1 g/dL (ref 6.4–8.3)

## 2011-11-07 LAB — URINALYSIS, ROUTINE W REFLEX MICROSCOPIC
Glucose, UA: NEGATIVE mg/dL
Leukocytes, UA: NEGATIVE
Specific Gravity, Urine: 1.006 (ref 1.005–1.030)
pH: 7 (ref 5.0–8.0)

## 2011-11-07 LAB — BASIC METABOLIC PANEL
BUN: 11 mg/dL (ref 6–23)
CO2: 28 mEq/L (ref 19–32)
Glucose, Bld: 105 mg/dL — ABNORMAL HIGH (ref 70–99)
Potassium: 4 mEq/L (ref 3.5–5.1)
Sodium: 128 mEq/L — ABNORMAL LOW (ref 135–145)

## 2011-11-07 MED ORDER — IOHEXOL 300 MG/ML  SOLN
100.0000 mL | Freq: Once | INTRAMUSCULAR | Status: AC | PRN
  Administered 2011-11-07: 100 mL via INTRAVENOUS

## 2011-11-07 MED ORDER — IOHEXOL 300 MG/ML  SOLN
20.0000 mL | Freq: Once | INTRAMUSCULAR | Status: AC | PRN
  Administered 2011-11-07: 20 mL via ORAL

## 2011-11-07 MED ORDER — ONDANSETRON HCL 4 MG/2ML IJ SOLN
4.0000 mg | Freq: Once | INTRAMUSCULAR | Status: AC
  Administered 2011-11-07: 4 mg via INTRAVENOUS
  Filled 2011-11-07: qty 2

## 2011-11-07 MED ORDER — ACETAMINOPHEN 500 MG PO TABS
1000.0000 mg | ORAL_TABLET | Freq: Once | ORAL | Status: AC
  Administered 2011-11-07: 1000 mg via ORAL
  Filled 2011-11-07: qty 2

## 2011-11-07 MED ORDER — SODIUM CHLORIDE 0.9 % IV BOLUS (SEPSIS)
1000.0000 mL | Freq: Once | INTRAVENOUS | Status: AC
  Administered 2011-11-07: 1000 mL via INTRAVENOUS

## 2011-11-07 NOTE — Progress Notes (Signed)
ID: [name]   DOB: [birth year]  MR#: [id number]  JXB#:[id number]  HISTORY OF PRESENT ILLNESS: [name] is a 76 year old [place] woman referred by Dr. Tilda Burrow for evaluation and treatment in the setting of newly diagnosed breast cancer.   The patient had routine screening mammography June 2011 which was unremarkable.  Repeat screening mammography December 11, 2010, at [facility], however, showed a possible abnormality in the left breast.  Additional imaging on October 18th showed an irregular, lobulated mass in the lower outer quadrant of the left breast which by ultrasound was irregular and hypoechoic.  It measured 2.5 cm by ultrasound.  The left axilla showed a benign-looking lymph node which did not appear to have changed compared to as seen in prior mammograms.   With this information, the patient underwent biopsy of the left breast mass October 25th, and this showed [phone]) an invasive mammary carcinoma with some lobular features but strongly E-cadherin positive, and so an invasive ductal carcinoma.  There was evidence of perineural invasion, although no definite evidence of angiolymphatic invasion.  The tumor had a CISH ratio of HER-2 to CEP17 signals of 1.15, indicating no amplification.  Estrogen receptor was positive at 100%, progesterone receptor was positive at 61%, and the proliferation marker was 72%.   With this information, the patient was referred to Dr. [name], and bilateral breast MRIs were obtained December 24, 2010.  This showed the mass in the left breast to measure 1.7 cm, to be solitary, to be not contacting the pectoralis, and in addition, there was no suspicious finding in the right breast, no pathologically enlarged axillary or internal mammary chain lymph nodes.   INTERVAL HISTORY: [name] returns today for followup of her left breast carcinoma. She has not been taking her letrozole regularly. She tells me she averages 3 days weekly taking the medication due to  side effects. She does have some increased pain she attributes to the letrozole, but her biggest complaint is that it makes her feel like she is "in a fog". She tells me she notices a definite improvement in how she feels on the days she is not taking the letrozole.  Otherwise, interval history is remarkable for a visit earlier today to the emergency room for hypertension, diarrhea, and nausea. She was recently diagnosed with diverticulitis and is currently on Flagyl and Cipro. They did not believe this was an infectious process. She sees Dr. Sheryn Bison for her GI concerns. The good news is that they obtained a chest x-ray, CT of the abdomen and pelvis, and a CT of the head during this visit to the emergency room, none of which showed evidence of metastatic disease.   REVIEW OF SYSTEMS: [name] has had no known fevers. She does have some occasional chills and hot flashes. Her appetite has been reduced, and she has had some intermittent nausea, but no emesis. She has noted blood in her stool on one occasion, and she attributes this to a rectal fissure. She's had no additional bleeding whatsoever. No new cough, phlegm production, shortness of breath, or chest pain. No abnormal headaches or dizziness. Her energy level is low, and she does have some problems sleeping at times.   Otherwise a detailed review of systems today was noncontributory.  PAST MEDICAL HISTORY: Past Medical History  Diagnosis Date  . Unspecified glaucoma   . Allergic rhinitis, cause unspecified   . Unspecified asthma   . Unspecified essential hypertension   . Other and unspecified hyperlipidemia   . Gastritis   .  Abdominal pain, left lower quadrant   . Diverticulosis of colon (without mention of hemorrhage)   . Irritable bowel syndrome   . Colonic polyp   . Headache   . Dizziness   . Breast cancer, Left 12/20/2010  . Gallstones   . CAD (coronary artery disease)     a. 01/19/2010 s/p CABG x 3, lima->lad, vg->diag,  vg->om1;  b. 06/2011 :[facility] MV: EF 84%, No ischemia.  . Bronchitis     hx of  . Pneumonia   . Hiatal hernia   . Fibromyalgia   . Esophageal reflux     hiatal hernia  Coronary artery disease status post open heart surgery under Ed Gerhardt November 2011, with bypasses from the left internal mammary artery to the left anterior descending, and then reverse saphenous vein graft to the diagonal coronary artery.  The patient had previously had stents placed by Dr. Eden Emms in 2007.  Other problems include a history of reflux esophagitis, hypertension, hyperlipidemia, rosacea and gallstones.  PAST SURGICAL HISTORY: Past Surgical History  Procedure Date  . Coronary angioplasty with stent placement     Stent 2007  . Open heart surgery     01/19/2010  . Coronary artery bypass graft   . Eye surgery     bilateral cataract removal,  . Breast lumpectomy 02/06/11    left    FAMILY HISTORY Family History  Problem Relation Age of Onset  . Heart disease Brother   . Cancer Brother     gland cancer  . Diabetes Sister   . Breast cancer Sister   . Stroke Mother   . Stroke Father   The patient's father died at the age of 28 following a stroke.  The patient's mother died at 87 with a history of Alzheimer disease.  The patient had 2 sisters, one of whom developed breast cancer at the age of 66 and survives.  The patient has a brother who apparently had lymphoma.  He is also a survivor.  GYNECOLOGIC HISTORY: Menarche age 83.  Menopause in the mid 1990s.  She never used hormone replacement.  She is a GX, P2.  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  Her husband, [name],  has worked as a Curator and is still very active in his shop.   Daughter, [name], lives in [place] and is a home school mom.  Daughter, [name],  lives in [place] and works as a Geophysicist/field seismologist for the [employer].  The patient attends the [employer] in  [place].   ADVANCED DIRECTIVES:  HEALTH MAINTENANCE: History  Substance Use Topics  . Smoking status: Never Smoker   . Smokeless tobacco: Never Used  . Alcohol Use: No     Colonoscopy: D. Patterson  PAP: 2011  Bone density: 2012/[facility]  Lipid panel:  Allergies  Allergen Reactions  . Choline Fenofibrate Other (See Comments)    REACTION: pt states INTOL to Trilipix w/ "thigh burning"  . Simvastatin Other (See Comments)    REACTION: pt states INTOL to STATINS \\T \ refuses to restart  . Hydrocodone     Nightmare after taking cough syrup w/hydrocodone  . Letrozole     Makes patient hurt  . Adhesive (Tape) Rash  . Ceclor (Cefaclor) Rash  . Clarithromycin Rash  . Codeine Nausea Only  . Doxycycline Rash  . Lisinopril Cough    Developed ACE cough...  . Penicillins Itching and Rash  At injection site  . Tobramycin-Dexamethasone Rash    Current Outpatient Prescriptions  Medication Sig Dispense Refill  . aspirin 81 MG tablet Take 81 mg by mouth daily.        . ciprofloxacin (CIPRO) 500 MG tablet Take 1 tablet (500 mg total) by mouth 2 (two) times daily.  28 tablet  0  . Coenzyme Q10 (COQ10) 100 MG CAPS Take 1 capsule by mouth daily.        . fexofenadine (ALLEGRA) 180 MG tablet Take 180 mg by mouth daily.        . Glucosamine-Chondroit-Vit C-Mn (GLUCOSAMINE CHONDR 1500 COMPLX PO) Take 1 tablet by mouth 3 (three) times daily.        . hyoscyamine (ANASPAZ) 0.125 MG TBDP Place 1 tablet (0.125 mg total) under the tongue every 6 (six) hours as needed.  40 tablet  2  . letrozole (FEMARA) 2.5 MG tablet Take 2.5 mg by mouth daily.      [name] losartan-hydrochlorothiazide (HYZAAR) 100-12.5 MG per tablet take 1 tablet by mouth once daily  30 tablet  12  . metoprolol (LOPRESSOR) 50 MG tablet take 1 tablet by mouth twice a day  60 tablet  6  . metroNIDAZOLE (FLAGYL) 250 MG tablet Take 1 tablet (250 mg total) by mouth 2 (two) times daily.  28 tablet  0  . mometasone (NASONEX) 50 MCG/ACT nasal  spray 2 sprays by Nasal route daily.        . nitroGLYCERIN (NITROSTAT) 0.4 MG SL tablet Take one tablet  By mouth SL every 5 minutes for a total of 3 doses as needed for chest pain.  25 tablet  3  . Olopatadine HCl (PATANASE) 0.6 % SOLN Place 2 puffs into the nose 2 (two) times daily.      . Omega-3 Fatty Acids (FISH OIL) 1200 MG CAPS 2 tabs po bid       . omeprazole (PRILOSEC) 40 MG capsule take 1 capsule by mouth once daily 30 MINUTES BEFORE THE FIRST MEAL OF THE DAY  30 capsule  6  . rosuvastatin (CRESTOR) 10 MG tablet Take 1 tablet (10 mg total) by mouth daily. 1 tab po daily( except on Tuesday and Thursday no tabs )  and on Wednesday 1/2 tab daily  30 tablet  6  . traMADol (ULTRAM) 50 MG tablet Take 50 mg by mouth as needed. For pain      . travoprost, benzalkonium, (TRAVATAN) 0.004 % ophthalmic solution Place 1 drop into both eyes at bedtime.       . vitamin B-12 (CYANOCOBALAMIN) 1000 MCG tablet Take 1,000 mcg by mouth daily.        . Vitamin D, Ergocalciferol, (DRISDOL) 50000 UNITS CAPS Take 50,000 Units by mouth. Every other week       No current facility-administered medications for this visit.   Facility-Administered Medications Ordered in Other Visits  Medication Dose Route Frequency Provider Last Rate Last Dose  . acetaminophen (TYLENOL) tablet 1,000 mg  1,000 mg Oral Once April K Palumbo-Rasch, MD   1,000 mg at 11/07/11 0058  . iohexol (OMNIPAQUE) 300 MG/ML solution 100 mL  100 mL Intravenous Once PRN Medication Radiologist, MD   100 mL at 11/07/11 0147  . iohexol (OMNIPAQUE) 300 MG/ML solution 20 mL  20 mL Oral Once PRN Medication Radiologist, MD   20 mL at 11/07/11 0147  . ondansetron (ZOFRAN) injection 4 mg  4 mg Intravenous Once April K Palumbo-Rasch, MD   4 mg at 11/07/11  4540  . sodium chloride 0.9 % bolus 1,000 mL  1,000 mL Intravenous Once April K Palumbo-Rasch, MD   1,000 mL at 11/07/11 9811    OBJECTIVE: Middle-aged white woman in no acute distress Filed Vitals:    11/07/11 1052  BP: 123/72  Pulse: 67  Temp: 98.5 F (36.9 C)  Resp: 20     Body mass index is 27.63 kg/(m^2).    ECOG FS: 1  Sclerae unicteric Oropharynx clear No peripheral adenopathy Lungs no rales or rhonchi Heart regular rate and rhythm Abd benign, soft, nontender, nondistended. Positive bowel sounds MSK no focal spinal tenderness No peripheral edema Neuro: nonfocal, alert and oriented x3 Breasts: The right breast is unremarkable; left breast is status post lumpectomy. No suspicious nodularity or skin changes, and no evidence of local recurrence. Axillae are benign bilaterally, no adenopathy  LAB RESULTS: Lab Results  Component Value Date   WBC 5.0 11/07/2011   NEUTROABS 2.1 11/07/2011   HGB 12.4 11/07/2011   HCT 36.9 11/07/2011   MCV 87.9 11/07/2011   PLT 340 11/07/2011      Chemistry      Component Value Date/Time   NA 132* 11/07/2011 1039   NA 128* 11/07/2011 0050   K 4.7 11/07/2011 1039   K 4.0 11/07/2011 0050   CL 96* 11/07/2011 1039   CL 88* 11/07/2011 0050   CO2 27 11/07/2011 1039   CO2 28 11/07/2011 0050   BUN 9.0 11/07/2011 1039   BUN 11 11/07/2011 0050   CREATININE 0.8 11/07/2011 1039   CREATININE 0.60 11/07/2011 0050      Component Value Date/Time   CALCIUM 9.6 11/07/2011 1039   CALCIUM 9.9 11/07/2011 0050   ALKPHOS 80 11/07/2011 1039   ALKPHOS 64 08/01/2011 1133   AST 31 11/07/2011 1039   AST 22 08/01/2011 1133   ALT 33 11/07/2011 1039   ALT 23 08/01/2011 1133   BILITOT 0.90 11/07/2011 1039   BILITOT 1.1 08/01/2011 1133       Lab Results  Component Value Date   LABCA2 20 08/01/2011    STUDIES: Dg Chest 2 View  11/07/2011  *RADIOLOGY REPORT*  Clinical Data: Nausea.  Headache.  CHEST - 2 VIEW  Comparison: 07/06/2011.  Findings: The heart remains normal in size.  Stable post CABG changes.  The lungs are clear.  Minimal scoliosis.  Mild thoracic spine degenerative changes.  IMPRESSION: No acute abnormality.   Original Report Authenticated By: Darrol Angel, M.D.    Ct  Head Wo Contrast  11/07/2011  *RADIOLOGY REPORT*  Clinical Data: Headache.  Nausea.  CT HEAD WITHOUT CONTRAST  Technique:  Contiguous axial images were obtained from the base of the skull through the vertex without contrast.  Comparison: Brain MR dated 04/02/2007.  Findings: No significant change in diffuse enlargement of the ventricles and subarachnoid spaces.  Mild patchy white matter low density in both cerebral hemispheres with mild progression.  No intracranial hemorrhage, mass lesion or CT evidence of acute infarction.  Unremarkable bones and included paranasal sinuses.  IMPRESSION:  1.  No acute abnormality. 2.  Stable atrophy. 3.  Mildly progressive mild chronic small vessel white matter ischemic changes in both cerebral hemispheres.   Original Report Authenticated By: Darrol Angel, M.D.    Ct Abdomen Pelvis W Contrast  11/07/2011  *RADIOLOGY REPORT*  Clinical Data: Nausea.  Headache.  CT ABDOMEN AND PELVIS WITH CONTRAST  Technique:  Multidetector CT imaging of the abdomen and pelvis was performed following the standard  protocol during bolus administration of intravenous contrast.  Contrast: 20mL OMNIPAQUE IOHEXOL 300 MG/ML  SOLN, OMNIPAQUE IOHEXOL 300 MG/ML  SOLN  Comparison: 12/22/2007.  Findings: Minimal diffuse low density of the liver relative to the spleen.  Small rounded areas of low density in the spleen are slightly less prominent.  The pancreatic margins remain mildly ill- defined with no peripancreatic soft tissue stranding or fluid. Unremarkable adrenal glands, kidneys, gallbladder, urinary bladder, uterus and ovaries.  Multiple colonic diverticula without evidence of diverticulitis. No enlarged lymph nodes.  No free peritoneal fluid.  Stable minimal linear scarring at the lung bases.  Stable small calcified granuloma in the left lower lobe, laterally.  Stable lower lumbar spine and lumbosacral spine degenerative changes with grade 1 anterolisthesis at the L4-5 level.  Interval small  amount of gas in the posterior spinal canal on the right at the L4-5 level, most likely arising from a vacuum phenomenon within the disc at that level.  IMPRESSION:  1.  No acute abnormality. 2.  Minimal diffuse hepatic steatosis. 3.  Colonic diverticulosis without evidence of diverticulitis.   Original Report Authenticated By: Darrol Angel, M.D.     ASSESSMENT: 76 y.o. [place] woman   (1)  status post left lumpectomy and sentinel lymph node biopsy December of 2012 for a T2 N0, stage IIA invasive ductal carcinoma, grade 3, HER-2 not amplified, strongly estrogen and progesterone receptor positive with an elevated proliferation marker at 72%; with an Oncotype recurrence score of 21, in the intermediate range, indicating a 13% and distant disease recurrence rate of 13% within 10 years if the patient took tamoxifen for 5 years.   (2)  She completed radiation  March of 2011 and started letrozole at that time.  PLAN: With regards to her breast cancer, [name] appears to be doing very well with no clinical evidence of disease recurrence. What she is not tolerating apparently is the letrozole, and per Dr. Darnelle Catalan previous plan of treatment, we will hold letrozole for several weeks and reassess. Accordingly, I will see Pat back in approximately 6 weeks.  With regards to her GI issues, she will followup with her gastroenterologist, Dr. Jarold Motto next week. This is going to be unrelated to her breast cancer diagnosis.  Pat voices understanding and agreement with our plan, and will call with any changes or problems.

## 2011-11-07 NOTE — Telephone Encounter (Signed)
Mammogram at [facility] on 12-12-2011 at 1:15pm

## 2011-11-07 NOTE — ED Notes (Signed)
Pt c/o nausea and "dont feel right" pt states head feels tight. Pt reports flare up of diverticulitis started taking flagyl and cipro on fri.

## 2011-11-07 NOTE — Telephone Encounter (Signed)
Gave patient appointment for 12-17-2011 starting at 9:15am

## 2011-11-07 NOTE — ED Provider Notes (Addendum)
History     CSN: [id number]  Arrival date & time 11/07/11  0010   First MD Initiated Contact with Patient 11/07/11 0019      Chief Complaint  Patient presents with  . Nausea  . Headache    (Consider location/radiation/quality/duration/timing/severity/associated sxs/prior treatment) Patient is a 76 y.o. female presenting with hypertension and diarrhea. The history is provided by the patient.  Hypertension This is a recurrent problem. The current episode started less than 1 hour ago. The problem occurs constantly. The problem has not changed since onset.Associated symptoms include headaches. Pertinent negatives include no chest pain, no abdominal pain and no shortness of breath. Nothing aggravates the symptoms. Nothing relieves the symptoms. She has tried nothing for the symptoms. The treatment provided moderate relief.  Diarrhea The primary symptoms include nausea and diarrhea. Primary symptoms do not include fever, weight loss, fatigue, abdominal pain, vomiting, melena, hematochezia or dysuria. The illness began more than 7 days ago. The onset was sudden. The problem has not changed since onset. The diarrhea began more than 1 week ago. The diarrhea is watery. The diarrhea occurs 2 to 4 times per day. Risk factors: diagnosed on Friday with diverticulitis.  The illness does not include chills or anorexia. Significant associated medical issues include diverticulitis.  Thinks HA and BP are due to cipro and flagyl for diverticulitis.    Past Medical History  Diagnosis Date  . Unspecified glaucoma   . Allergic rhinitis, cause unspecified   . Unspecified asthma   . Unspecified essential hypertension   . Other and unspecified hyperlipidemia   . Gastritis   . Abdominal pain, left lower quadrant   . Diverticulosis of colon (without mention of hemorrhage)   . Irritable bowel syndrome   . Colonic polyp   . Headache   . Dizziness   . Breast cancer, Left 12/20/2010  . Gallstones   . CAD  (coronary artery disease)     a. 01/19/2010 s/p CABG x 3, lima->lad, vg->diag, vg->om1;  b. 06/2011 :Lexi MV: EF 84%, No ischemia.  . Bronchitis     hx of  . Pneumonia   . Hiatal hernia   . Fibromyalgia   . Esophageal reflux     hiatal hernia    Past Surgical History  Procedure Date  . Coronary angioplasty with stent placement     Stent 2007  . Open heart surgery     01/19/2010  . Coronary artery bypass graft   . Eye surgery     bilateral cataract removal,  . Breast lumpectomy 02/06/11    left    Family History  Problem Relation Age of Onset  . Heart disease Brother   . Cancer Brother     gland cancer  . Diabetes Sister   . Breast cancer Sister   . Stroke Mother   . Stroke Father     History  Substance Use Topics  . Smoking status: Never Smoker   . Smokeless tobacco: Never Used  . Alcohol Use: No    OB History    Grav Para Term Preterm Abortions TAB SAB Ect Mult Living                  Review of Systems  Constitutional: Negative for fever, chills, weight loss and fatigue.  Respiratory: Negative for chest tightness and shortness of breath.   Cardiovascular: Negative for chest pain.  Gastrointestinal: Positive for nausea and diarrhea. Negative for vomiting, abdominal pain, blood in stool, melena, hematochezia and anorexia.  Genitourinary: Negative for dysuria.  Neurological: Positive for headaches. Negative for dizziness, speech difficulty, weakness, light-headedness and numbness.  All other systems reviewed and are negative.    Allergies  Choline fenofibrate; Simvastatin; Hydrocodone; Letrozole; Adhesive; Ceclor; Clarithromycin; Codeine; Doxycycline; Lisinopril; Penicillins; and Tobramycin-dexamethasone  Home Medications   Current Outpatient Rx  Name Route Sig Dispense Refill  . ASPIRIN 81 MG PO TABS Oral Take 81 mg by mouth daily.      [name] CIPROFLOXACIN HCL 500 MG PO TABS Oral Take 1 tablet (500 mg total) by mouth 2 (two) times daily. 28 tablet 0  .  COQ10 100 MG PO CAPS Oral Take 1 capsule by mouth daily.      [name] FEXOFENADINE HCL 180 MG PO TABS Oral Take 180 mg by mouth daily.      [name] GLUCOSAMINE CHONDR 1500 COMPLX PO Oral Take 1 tablet by mouth 3 (three) times daily.      [name] HYOSCYAMINE SULFATE 0.125 MG PO TBDP Sublingual Place 1 tablet (0.125 mg total) under the tongue every 6 (six) hours as needed. 40 tablet 2  . LETROZOLE 2.5 MG PO TABS Oral Take 2.5 mg by mouth daily.    [name] LOSARTAN POTASSIUM-HCTZ 100-12.5 MG PO TABS  take 1 tablet by mouth once daily 30 tablet 12  . METOPROLOL TARTRATE 50 MG PO TABS  take 1 tablet by mouth twice a day 60 tablet 6    Refill Approved  . METRONIDAZOLE 250 MG PO TABS Oral Take 1 tablet (250 mg total) by mouth 2 (two) times daily. 28 tablet 0  . MOMETASONE FUROATE 50 MCG/ACT NA SUSP Nasal 2 sprays by Nasal route daily.      [name] NITROGLYCERIN 0.4 MG SL SUBL  Take one tablet  By mouth SL every 5 minutes for a total of 3 doses as needed for chest pain. 25 tablet 3  . OLOPATADINE HCL 0.6 % NA SOLN Nasal Place 2 puffs into the nose 2 (two) times daily.    [name] FISH OIL 1200 MG PO CAPS  2 tabs po bid     . OMEPRAZOLE 40 MG PO CPDR  take 1 capsule by mouth once daily 30 MINUTES BEFORE THE FIRST MEAL OF THE DAY 30 capsule 6  . ROSUVASTATIN CALCIUM 10 MG PO TABS Oral Take 1 tablet (10 mg total) by mouth daily. 1 tab po daily( except on Tuesday and Thursday no tabs )  and on Wednesday 1/2 tab daily 30 tablet 6  . TRAMADOL HCL 50 MG PO TABS Oral Take 50 mg by mouth as needed. For pain    . TRAVOPROST 0.004 % OP SOLN Both Eyes Place 1 drop into both eyes at bedtime.     [name] VITAMIN B-12 1000 MCG PO TABS Oral Take 1,000 mcg by mouth daily.      [name] VITAMIN D (ERGOCALCIFEROL) 50000 UNITS PO CAPS Oral Take 50,000 Units by mouth. Every other week      BP 169/75  Pulse 64  Temp 98.2 F (36.8 C) (Oral)  Resp 18  Ht 4\' 10"  (1.473 m)  Wt 133 lb (60.328 kg)  BMI 27.80 kg/m2  SpO2 97%  Physical Exam  Constitutional: She is  oriented to person, place, and time. She appears well-developed and well-nourished. No distress.  HENT:  Head: Normocephalic and atraumatic.  Mouth/Throat: Oropharynx is clear and moist.  Eyes: Conjunctivae normal are normal. Pupils are equal, round, and reactive to light.  Neck: Normal range of motion. Neck supple.  Cardiovascular:  Normal rate, regular rhythm, normal heart sounds and intact distal pulses.   Pulmonary/Chest: Effort normal and breath sounds normal. She has no wheezes. She has no rales.  Abdominal: Soft. Bowel sounds are normal. There is no tenderness. There is no rebound and no guarding.  Musculoskeletal: Normal range of motion.  Neurological: She is alert and oriented to person, place, and time. She has normal reflexes. No cranial nerve deficit.  Skin: Skin is warm and dry.    ED Course  Procedures (including critical care time)  Labs Reviewed  CBC WITH DIFFERENTIAL - Abnormal; Notable for the following:    Neutrophils Relative 40 (*)     All other components within normal limits  BASIC METABOLIC PANEL - Abnormal; Notable for the following:    Sodium 128 (*)     Chloride 88 (*)     Glucose, Bld 105 (*)     GFR calc non Af Amer 88 (*)     All other components within normal limits  URINALYSIS, ROUTINE W REFLEX MICROSCOPIC - Abnormal; Notable for the following:    Color, Urine STRAW (*)     All other components within normal limits  TROPONIN I   Dg Chest 2 View  11/07/2011  *RADIOLOGY REPORT*  Clinical Data: Nausea.  Headache.  CHEST - 2 VIEW  Comparison: 07/06/2011.  Findings: The heart remains normal in size.  Stable post CABG changes.  The lungs are clear.  Minimal scoliosis.  Mild thoracic spine degenerative changes.  IMPRESSION: No acute abnormality.   Original Report Authenticated By: Darrol Angel, M.D.    Ct Head Wo Contrast  11/07/2011  *RADIOLOGY REPORT*  Clinical Data: Headache.  Nausea.  CT HEAD WITHOUT CONTRAST  Technique:  Contiguous axial images were  obtained from the base of the skull through the vertex without contrast.  Comparison: Brain MR dated 04/02/2007.  Findings: No significant change in diffuse enlargement of the ventricles and subarachnoid spaces.  Mild patchy white matter low density in both cerebral hemispheres with mild progression.  No intracranial hemorrhage, mass lesion or CT evidence of acute infarction.  Unremarkable bones and included paranasal sinuses.  IMPRESSION:  1.  No acute abnormality. 2.  Stable atrophy. 3.  Mildly progressive mild chronic small vessel white matter ischemic changes in both cerebral hemispheres.   Original Report Authenticated By: Darrol Angel, M.D.    Ct Abdomen Pelvis W Contrast  11/07/2011  *RADIOLOGY REPORT*  Clinical Data: Nausea.  Headache.  CT ABDOMEN AND PELVIS WITH CONTRAST  Technique:  Multidetector CT imaging of the abdomen and pelvis was performed following the standard protocol during bolus administration of intravenous contrast.  Contrast: 20mL OMNIPAQUE IOHEXOL 300 MG/ML  SOLN, OMNIPAQUE IOHEXOL 300 MG/ML  SOLN  Comparison: 12/22/2007.  Findings: Minimal diffuse low density of the liver relative to the spleen.  Small rounded areas of low density in the spleen are slightly less prominent.  The pancreatic margins remain mildly ill- defined with no peripancreatic soft tissue stranding or fluid. Unremarkable adrenal glands, kidneys, gallbladder, urinary bladder, uterus and ovaries.  Multiple colonic diverticula without evidence of diverticulitis. No enlarged lymph nodes.  No free peritoneal fluid.  Stable minimal linear scarring at the lung bases.  Stable small calcified granuloma in the left lower lobe, laterally.  Stable lower lumbar spine and lumbosacral spine degenerative changes with grade 1 anterolisthesis at the L4-5 level.  Interval small amount of gas in the posterior spinal canal on the right at the L4-5 level, most  likely arising from a vacuum phenomenon within the disc at that level.   IMPRESSION:  1.  No acute abnormality. 2.  Minimal diffuse hepatic steatosis. 3.  Colonic diverticulosis without evidence of diverticulitis.   Original Report Authenticated By: Darrol Angel, M.D.      1. Diarrhea   2. Hypertension       MDM   Date: 11/07/2011  Rate: 64  Rhythm: normal sinus rhythm  QRS Axis: normal  Intervals: PR prolonged  ST/T Wave abnormalities: nonspecific ST changes  Conduction Disutrbances:first-degree A-V block   Narrative Interpretation:   Old EKG Reviewed: changes noted  Headache improved post BP coming down on own.  All labs and CTs reassuring. Talk to your doctor about change in medications.  Return for Headache, chest pain, worsening diarrhea, weakness fevers chills or any concerns.  Patient verbalizes understanding and agrees to follow up        Mariano Doshi K Jeimy Bickert-Rasch, MD 11/07/11 0324  Quetzali Heinle K Sephora Boyar-Rasch, MD 11/07/11 1610

## 2011-11-08 LAB — CANCER ANTIGEN 27.29: CA 27.29: 20 U/mL (ref 0–39)

## 2011-11-12 ENCOUNTER — Ambulatory Visit (INDEPENDENT_AMBULATORY_CARE_PROVIDER_SITE_OTHER): Payer: Medicare Other | Admitting: Gastroenterology

## 2011-11-12 ENCOUNTER — Encounter: Payer: Self-pay | Admitting: Gastroenterology

## 2011-11-12 VITALS — BP 140/72 | HR 60 | Ht <= 58 in | Wt 131.0 lb

## 2011-11-12 DIAGNOSIS — K5730 Diverticulosis of large intestine without perforation or abscess without bleeding: Secondary | ICD-10-CM

## 2011-11-12 DIAGNOSIS — R1032 Left lower quadrant pain: Secondary | ICD-10-CM | POA: Diagnosis not present

## 2011-11-12 DIAGNOSIS — K219 Gastro-esophageal reflux disease without esophagitis: Secondary | ICD-10-CM | POA: Diagnosis not present

## 2011-11-12 DIAGNOSIS — G8929 Other chronic pain: Secondary | ICD-10-CM | POA: Diagnosis not present

## 2011-11-12 NOTE — Patient Instructions (Addendum)
Please take Metamucil 1 packet daily Please call us in 1 month with an update on your symptoms at [phone] You have been shown the movie on diverticulitis, please call us is you think of any questions

## 2011-11-12 NOTE — Progress Notes (Signed)
This is a very nice 76 year old Caucasian female recently treated for diverticulitis with Cipro and metronidazole. CT scan performed was unremarkable. She does have known diverticulosis from previous colonoscopies. Currently she is asymptomatic except for some gas, bloating, and incomplete rectal emptying without melena or hematochezia, or systemic complaints. She does take omeprazole 40 mg a day for chronic acid reflux, and denies upper GI or hepatobiliary complaints. Her appetite is good, and she is on a regular high fiber diet. She was to stop antibiotics because of side effects related to Cipro.  Current Medications, Allergies, Past Medical History, Past Surgical History, Family History and Social History were reviewed in [employer] record.  Pertinent Review of Systems Negative   Physical Exam: Healthy-appearing patient in no distress. Blood pressure 140/72, pulse 60 and regular, and weight 131 pounds with a BMI of 27.38. There is no abdominal distention, organomegaly, masses, or tenderness. Bowel sounds are entirely normal. Mental status is normal.    Assessment and Plan: Symptomatic diverticulosis with possible recent subacute diverticulitis which seems to resolve with antibiotic therapy. I have placed her on a high fiber diet with daily Citrucel and liberal by mouth fluids. She uses when necessary Levsin for nominal spasms. I showed her our patient education video on diverticulosis and its management. She is to continue her reflux regime and daily PPI therapy. She is on B12 replacement therapy with oral supplements. No diagnosis found.

## 2011-11-14 DIAGNOSIS — J309 Allergic rhinitis, unspecified: Secondary | ICD-10-CM | POA: Diagnosis not present

## 2011-11-15 DIAGNOSIS — J309 Allergic rhinitis, unspecified: Secondary | ICD-10-CM | POA: Diagnosis not present

## 2011-11-26 DIAGNOSIS — M5412 Radiculopathy, cervical region: Secondary | ICD-10-CM | POA: Diagnosis not present

## 2011-11-26 DIAGNOSIS — M5030 Other cervical disc degeneration, unspecified cervical region: Secondary | ICD-10-CM | POA: Diagnosis not present

## 2011-11-26 DIAGNOSIS — M9981 Other biomechanical lesions of cervical region: Secondary | ICD-10-CM | POA: Diagnosis not present

## 2011-11-28 DIAGNOSIS — J309 Allergic rhinitis, unspecified: Secondary | ICD-10-CM | POA: Diagnosis not present

## 2011-11-28 DIAGNOSIS — Z23 Encounter for immunization: Secondary | ICD-10-CM | POA: Diagnosis not present

## 2011-12-09 ENCOUNTER — Ambulatory Visit: Payer: Medicare Other | Admitting: Radiation Oncology

## 2011-12-09 ENCOUNTER — Ambulatory Visit
Admission: RE | Admit: 2011-12-09 | Discharge: 2011-12-09 | Disposition: A | Discharge status: Home / Self Care | Payer: Medicare Other | Source: Ambulatory Visit | Attending: Radiation Oncology | Admitting: Radiation Oncology

## 2011-12-09 ENCOUNTER — Encounter: Payer: Self-pay | Admitting: Radiation Oncology

## 2011-12-09 VITALS — BP 173/82 | HR 72 | Temp 97.5°F | Resp 18 | Wt 134.4 lb

## 2011-12-09 DIAGNOSIS — R079 Chest pain, unspecified: Secondary | ICD-10-CM | POA: Diagnosis not present

## 2011-12-09 DIAGNOSIS — J301 Allergic rhinitis due to pollen: Secondary | ICD-10-CM | POA: Diagnosis not present

## 2011-12-09 DIAGNOSIS — J45909 Unspecified asthma, uncomplicated: Secondary | ICD-10-CM | POA: Diagnosis not present

## 2011-12-09 DIAGNOSIS — J3089 Other allergic rhinitis: Secondary | ICD-10-CM | POA: Diagnosis not present

## 2011-12-09 DIAGNOSIS — C50519 Malignant neoplasm of lower-outer quadrant of unspecified female breast: Secondary | ICD-10-CM

## 2011-12-09 NOTE — Progress Notes (Signed)
Radiation Oncology         [phone]) [phone] ________________________________  Name: [name] MRN: [id number]  Date: 12/09/2011  DOB: May 04, 1935  Follow-Up Visit Note  CC: Michele Mcalpine, MD  Magrinat, Valentino Hue, MD  Diagnosis:   Stage II left breast cancer  Interval Since Last Radiation:  7 months  Narrative:  The patient returns today for routine follow-up.  She seems to be doing reasonably well. She did stop letrozole in light of joint pain associated with this medicine. She will be meeting with medical oncology later this month to the be switched to another agent. Patient is unsure when her mammogram is scheduled but should be sometime this fall. She has mild tenderness in the left breast. She denies any nipple discharge or bleeding. She denies any problems with swelling in her left arm or hand.                              ALLERGIES:  is allergic to choline fenofibrate; simvastatin; hydrocodone; letrozole; adhesive; ceclor; clarithromycin; codeine; doxycycline; lisinopril; penicillins; and tobramycin-dexamethasone.  Meds: Current Outpatient Prescriptions  Medication Sig Dispense Refill  . aspirin 81 MG tablet Take 81 mg by mouth daily.        . Coenzyme Q10 (COQ10) 100 MG CAPS Take 1 capsule by mouth daily.        . fexofenadine (ALLEGRA) 180 MG tablet Take 180 mg by mouth daily.        . Glucosamine-Chondroit-Vit C-Mn (GLUCOSAMINE CHONDR 1500 COMPLX PO) Take 1 tablet by mouth 3 (three) times daily.        . hyoscyamine (ANASPAZ) 0.125 MG TBDP Place 1 tablet (0.125 mg total) under the tongue every 6 (six) hours as needed.  40 tablet  2  . losartan-hydrochlorothiazide (HYZAAR) 100-12.5 MG per tablet take 1 tablet by mouth once daily  30 tablet  12  . metoprolol (LOPRESSOR) 50 MG tablet take 1 tablet by mouth twice a day  60 tablet  6  . mometasone (NASONEX) 50 MCG/ACT nasal spray 2 sprays by Nasal route daily.        . nitroGLYCERIN (NITROSTAT) 0.4 MG SL tablet Take one tablet   By mouth SL every 5 minutes for a total of 3 doses as needed for chest pain.  25 tablet  3  . Olopatadine HCl (PATANASE) 0.6 % SOLN Place 2 puffs into the nose 2 (two) times daily.      . Omega-3 Fatty Acids (FISH OIL) 1200 MG CAPS 2 tabs po bid       . omeprazole (PRILOSEC) 40 MG capsule take 1 capsule by mouth once daily 30 MINUTES BEFORE THE FIRST MEAL OF THE DAY  30 capsule  6  . traMADol (ULTRAM) 50 MG tablet Take 50 mg by mouth as needed. For pain      . travoprost, benzalkonium, (TRAVATAN) 0.004 % ophthalmic solution Place 1 drop into both eyes at bedtime.       . vitamin B-12 (CYANOCOBALAMIN) 1000 MCG tablet Take 1,000 mcg by mouth daily.        . Vitamin D, Ergocalciferol, (DRISDOL) 50000 UNITS CAPS Take 50,000 Units by mouth. Every other week      . rosuvastatin (CRESTOR) 10 MG tablet Take 1 tablet (10 mg total) by mouth daily. 1 tab po daily( except on Tuesday and Thursday no tabs )  and on Wednesday 1/2 tab daily  30 tablet  6  Physical Findings: The patient is in no acute distress. Patient is alert and oriented.  weight is 134 lb 6.4 oz (60.963 kg). Her oral temperature is 97.5 F (36.4 C). Her blood pressure is 173/82 and her pulse is 72. Her respiration is 18. [name] No palpable cervical supraclavicular or axillary adenopathy. The lungs are clear to auscultation. The heart has a regular rhythm and rate. Examination of the right breast reveals no mass or nipple discharge. Examination of left breast reveals mild hyperpigmentation changes. There continues to be some edema in the nipple areolar complex area.  There is no dominant mass appreciated breast nipple discharge or bleeding.  She does have some prominent moles and inferior aspect of the breast and I have asked her to followup with her dermatologist concerning this area.  Lab Findings: Lab Results  Component Value Date   WBC 5.0 11/07/2011   HGB 12.4 11/07/2011   HCT 36.9 11/07/2011   MCV 87.9 11/07/2011   PLT 340 11/07/2011     @LASTCHEM @  Radiographic Findings: No results found.  Impression:  The patient is recovering from the effects of radiation.  No evidence of recurrence on clinical exam today.  Plan:  When necessary followup.  Patient will continue close followup with medical oncology and surgery.  She will also followup with her dermatologist for issues as above.  _____________________________________  -----------------------------------  Billie Lade, PhD, MD

## 2011-12-09 NOTE — Progress Notes (Signed)
Patient presents to the clinic today unaccompanied for follow up appointment with Dr. Roselind Messier. Patient is alert and oriented to person, place, and time. No distress noted. Steady gait noted. Pleasant affect noted. Patient denies pain at this time. Patient reports that she is no longer taking letrozole because of the joint pain it caused. Patient reports that she is scheduled to see Zollie Scale, PA on 10/22 to further discuss alternative anti estrogen therapy. Patient denies left breast pain. Patient reports that her left breast continues to feel numb on the under side. Patient denies nipple discharge. Patient reports on faint hyperpigmentation of left/treated breast. Patient denies using lotion on her left breast at this time. Patient reports that her appetite is normal and weight stable. Patient denies difficulty sleeping. Patient reports nausea and vomiting related to diverticulitis. Patient denies headaches. Patient reports that occasionally she feels dizziness but, relates it to effects of her bp medication. Reported all findings to Dr. Roselind Messier.

## 2011-12-10 ENCOUNTER — Telehealth: Payer: Self-pay | Admitting: Cardiovascular Disease

## 2011-12-10 DIAGNOSIS — M5030 Other cervical disc degeneration, unspecified cervical region: Secondary | ICD-10-CM | POA: Diagnosis not present

## 2011-12-10 DIAGNOSIS — M9981 Other biomechanical lesions of cervical region: Secondary | ICD-10-CM | POA: Diagnosis not present

## 2011-12-10 DIAGNOSIS — M5412 Radiculopathy, cervical region: Secondary | ICD-10-CM | POA: Diagnosis not present

## 2011-12-10 NOTE — Telephone Encounter (Signed)
SPOKE WITH PT  IS COMPLAINING OF CHEST PRESSURE   NOTICES  WORSE IN AM AND THEN EVENTUALLY GOES AWAY  DID TAKE 1  NTG  TODAY  WITH SOME RELIEF  SAW ALLERGIST YESTERDAY   AND WAS TOLD  THIS COULD BE RELATED TO ASTHMA  BUT TO CALL CARDIOLOGISTS   AFTER REVIEWING  PT'S RECORDS WAS DUE FOR October APPT . APPT MADE FOR TOM AT 10:30  INSTRUCTED PT IF S/S WORSEN OR IF HAS TO TAKE UP TO 3 NTG WITH NO RELIEF TO  GO TO ER FOR EVAL AND TX PT AGREES./CY

## 2011-12-10 NOTE — Telephone Encounter (Signed)
Pt calling re having some pressure in her chest, saw her allergy dr yesterday who thought it may be due to allergies, but to call here as well, pls call

## 2011-12-11 ENCOUNTER — Encounter: Payer: Self-pay | Admitting: Cardiovascular Disease

## 2011-12-11 ENCOUNTER — Ambulatory Visit (INDEPENDENT_AMBULATORY_CARE_PROVIDER_SITE_OTHER): Payer: Medicare Other | Admitting: Cardiovascular Disease

## 2011-12-11 ENCOUNTER — Ambulatory Visit (INDEPENDENT_AMBULATORY_CARE_PROVIDER_SITE_OTHER)
Admission: RE | Admit: 2011-12-11 | Discharge: 2011-12-11 | Disposition: A | Discharge status: Home / Self Care | Payer: Medicare Other | Source: Ambulatory Visit | Attending: Cardiovascular Disease | Admitting: Cardiovascular Disease

## 2011-12-11 VITALS — BP 193/80 | HR 65 | Ht 59.0 in | Wt 131.8 lb

## 2011-12-11 DIAGNOSIS — R079 Chest pain, unspecified: Secondary | ICD-10-CM | POA: Diagnosis not present

## 2011-12-11 DIAGNOSIS — R05 Cough: Secondary | ICD-10-CM | POA: Diagnosis not present

## 2011-12-11 DIAGNOSIS — K8020 Calculus of gallbladder without cholecystitis without obstruction: Secondary | ICD-10-CM | POA: Diagnosis not present

## 2011-12-11 DIAGNOSIS — R0789 Other chest pain: Secondary | ICD-10-CM | POA: Diagnosis not present

## 2011-12-11 LAB — CBC WITH DIFFERENTIAL/PLATELET
Basophils Absolute: 0.1 10*3/uL (ref 0.0–0.1)
Hemoglobin: 13.5 g/dL (ref 12.0–15.0)
Lymphocytes Relative: 40.2 % (ref 12.0–46.0)
Monocytes Relative: 9.2 % (ref 3.0–12.0)
Platelets: 295 10*3/uL (ref 150.0–400.0)
RDW: 14.9 % — ABNORMAL HIGH (ref 11.5–14.6)

## 2011-12-11 LAB — BASIC METABOLIC PANEL
CO2: 32 mEq/L (ref 19–32)
Chloride: 93 mEq/L — ABNORMAL LOW (ref 96–112)
Sodium: 134 mEq/L — ABNORMAL LOW (ref 135–145)

## 2011-12-11 LAB — HEPATIC FUNCTION PANEL
ALT: 31 U/L (ref 0–35)
Alkaline Phosphatase: 72 U/L (ref 39–117)
Bilirubin, Direct: 0.2 mg/dL (ref 0.0–0.3)
Total Protein: 7.4 g/dL (ref 6.0–8.3)

## 2011-12-11 MED ORDER — ISOSORBIDE MONONITRATE ER 30 MG PO TB24: 30.0000 mg | ORAL_TABLET | Freq: Every day | ORAL | Status: DC

## 2011-12-11 NOTE — Assessment & Plan Note (Signed)
ON good meds not normally an issue Nitrates being added Follow

## 2011-12-11 NOTE — Patient Instructions (Addendum)
Your physician recommends that you schedule a follow-up appointment in: 3-4 WEEKS WITH DR [facility] Your physician has recommended you make the following change in your medication: ADD IMDUR 30 MG EVERY DAY    Your physician recommends that you return for lab work in: TODAY  CBC BMET AMYLASE LIPASE   LIPID   DX CHEST PAIN  A chest x-ray takes a picture of the organs and structures inside the chest, including the heart, lungs, and blood vessels. This test can show several things, including, whether the heart is enlarges; whether fluid is building up in the lungs; and whether pacemaker / defibrillator leads are still in place.DX SHORTNESS OF BREATH  ULTRASOUND OF GALLBLADDER HX OF STONES  CHEST PAIN

## 2011-12-11 NOTE — Assessment & Plan Note (Signed)
Cholesterol is at goal.  Continue current dose of statin and diet Rx.  No myalgias or side effects.  F/U  LFT's in 6 months. Lab Results  Component Value Date   LDLCALC 101* 06/10/2011

## 2011-12-11 NOTE — Progress Notes (Signed)
Patient ID: [name], female   DOB: 1935/10/19, 76 y.o.   MRN: [id number] [name] is seen today post F/U for CAD with instent restenosis and CABG with Dr Tyrone Sage 01/19/10 . Post op course unremarkable. CRF;s include HTN and elevated lipids. Rx limited by side effects. Cough with ACE and myalgias with higher dose statins. She still has issues with gallstones. Prior to her CABG she had documented stones by [place] and has seen Dr Jarold Motto and Andrey Campanile. Recent headaches and bronchitis followed by Dr [name]. No chest pain Sternum well healed. Enjoys cardiac rehab. Recent clinic visit with good labs and tolerating lower dose statin She had been having issues with her BP meds but separating them in time seems to have solved this.  Myovue 07/15/11 reviewed and normal with no ischemia and EF over 80% CT 9/12 no cholycystitis  Has not felt well for a couple of weeks.  Malaise.  Mild wheezing with history of asthma.  Chest pain Some relief with belching but took nitro yesterday with relief.  Pain is nonexertional.  She has some radiation of pain to back.  No fever diarhea nausea or vohmiting    ROS: Denies fever, malais, weight loss, blurry vision, decreased visual acuity, cough, sputum, SOB, hemoptysis, pleuritic pain, palpitaitons, heartburn, abdominal pain, melena, lower extremity edema, claudication, or rash.  All other systems reviewed and negative  General: Affect appropriate Healthy:  appears stated age HEENT: normal Neck supple with no adenopathy JVP normal no bruits no thyromegaly Lungs clear with no wheezing and good diaphragmatic motion Heart:  S1/S2 no murmur, no rub, gallop or click PMI normal Abdomen: benighn, BS positve, no tenderness, no AAA no bruit.  No HSM or HJR Distal pulses intact with no bruits No edema Neuro non-focal Skin warm and dry No muscular weakness   Current Outpatient Prescriptions  Medication Sig Dispense Refill  . aspirin 81 MG tablet Take 81 mg by mouth  daily.        . Coenzyme Q10 (COQ10) 100 MG CAPS Take 1 capsule by mouth daily.        . fexofenadine (ALLEGRA) 180 MG tablet Take 180 mg by mouth daily.        . Glucosamine-Chondroit-Vit C-Mn (GLUCOSAMINE CHONDR 1500 COMPLX PO) Take 1 tablet by mouth 3 (three) times daily.        . hyoscyamine (ANASPAZ) 0.125 MG TBDP Place 1 tablet (0.125 mg total) under the tongue every 6 (six) hours as needed.  40 tablet  2  . losartan-hydrochlorothiazide (HYZAAR) 100-12.5 MG per tablet take 1 tablet by mouth once daily  30 tablet  12  . metoprolol (LOPRESSOR) 50 MG tablet take 1 tablet by mouth twice a day  60 tablet  6  . mometasone (NASONEX) 50 MCG/ACT nasal spray 2 sprays by Nasal route daily.        . nitroGLYCERIN (NITROSTAT) 0.4 MG SL tablet Take one tablet  By mouth SL every 5 minutes for a total of 3 doses as needed for chest pain.  25 tablet  3  . Olopatadine HCl (PATANASE) 0.6 % SOLN Place 2 puffs into the nose 2 (two) times daily.      . Omega-3 Fatty Acids (FISH OIL) 1200 MG CAPS 2 tabs po bid       . omeprazole (PRILOSEC) 40 MG capsule take 1 capsule by mouth once daily 30 MINUTES BEFORE THE FIRST MEAL OF THE DAY  30 capsule  6  . traMADol (ULTRAM) 50 MG tablet Take  50 mg by mouth as needed. For pain      . travoprost, benzalkonium, (TRAVATAN) 0.004 % ophthalmic solution Place 1 drop into both eyes at bedtime.       . vitamin B-12 (CYANOCOBALAMIN) 1000 MCG tablet Take 1,000 mcg by mouth daily.        . Vitamin D, Ergocalciferol, (DRISDOL) 50000 UNITS CAPS Take 50,000 Units by mouth. Every other week        Allergies  Choline fenofibrate; Simvastatin; Hydrocodone; Letrozole; Adhesive; Ceclor; Clarithromycin; Codeine; Doxycycline; Lisinopril; Penicillins; and Tobramycin-dexamethasone  Electrocardiogram:  Assessment and Plan

## 2011-12-11 NOTE — Assessment & Plan Note (Signed)
Myovue normal in May  Bypass grafts not that old.  Start imdur since BP elevated.  CXR to check mediastinum and lungs given radiation to back and mild wheezing.  Exam clear today.  F/U labs and GBUS given history of stones and increased belching.

## 2011-12-12 ENCOUNTER — Telehealth: Payer: Self-pay | Admitting: Gastroenterology

## 2011-12-12 DIAGNOSIS — R51 Headache: Secondary | ICD-10-CM

## 2011-12-12 MED ORDER — ISOMETHEPTENE-APAP-DICHLORAL 65-325-100 MG PO CAPS: 1.0000 | ORAL_CAPSULE | ORAL | Status: DC | PRN

## 2011-12-12 NOTE — Telephone Encounter (Signed)
Pt reports pressure in her chest at the sternum that radiates to her back. She had a scheduled appt yesterday with her cardiologist, Dr Eden Emms and he felt she was having gall bladder like s&s. He ordered labs that are normal, a chest xray that was normal and an Abdominal U/S for next week. I had the U/S r/s for tomorrow am and spoke with Mike Gip, PA about seeing her. We really can't do anything else until we see what the U/S shows. Pt had an Abdominal CT on 11/07/11 for nausea that was normal with no problems of gallbladder noted.  Pt also c/o a headache and dizziness even when she lies down. She informed Dr Eden Emms of this yesterday as well. Her BP was up, 193/80 and she is sure it's up today but she hasn't taken it. She also had CT of Head for nausea on 11/07/11 that was basically normal. She reports her headache is pressure at the back of her head. She took ES Tylenol for her headache this am. I asked about allergies and she stated she saw her allergy md and nothing was wrong. Pt is more upset about the headache than the chest pressure. She called Dr Jodelle Green ofc and couldn't get in today, so she called [place]. I offered to order Zofran per Amy for her Nausea, but she took dramamine. I cautioned her and asked her to take fall precautions d/t dizziness and dramamine.

## 2011-12-12 NOTE — Telephone Encounter (Signed)
Per SN---recs midrin to use for her headahces  #30   1 po every 4 hours prn for headaches and we will get her into see the headache clinic for follow up.  Order has been placed for the headache clinic. Called and lmomtcb for the pt to make her aware.

## 2011-12-12 NOTE — Telephone Encounter (Signed)
Pt returned my call and she is aware of SN recs and is ok with appt at the headache clinic and she is aware of the midrin that has been called to the pharmacy.  Pt is aware that we will call her with appt at headache clinic.  Nothing further is needed.

## 2011-12-13 ENCOUNTER — Ambulatory Visit (HOSPITAL_COMMUNITY)
Admission: RE | Admit: 2011-12-13 | Discharge: 2011-12-13 | Disposition: A | Discharge status: Home / Self Care | Payer: Medicare Other | Source: Ambulatory Visit | Attending: Cardiovascular Disease | Admitting: Cardiovascular Disease

## 2011-12-13 DIAGNOSIS — R079 Chest pain, unspecified: Secondary | ICD-10-CM | POA: Diagnosis not present

## 2011-12-13 DIAGNOSIS — R0789 Other chest pain: Secondary | ICD-10-CM | POA: Insufficient documentation

## 2011-12-13 DIAGNOSIS — K8020 Calculus of gallbladder without cholecystitis without obstruction: Secondary | ICD-10-CM | POA: Diagnosis not present

## 2011-12-17 ENCOUNTER — Telehealth: Payer: Self-pay | Admitting: *Deleted

## 2011-12-17 ENCOUNTER — Ambulatory Visit (HOSPITAL_BASED_OUTPATIENT_CLINIC_OR_DEPARTMENT_OTHER): Payer: Medicare Other | Admitting: Physician Assistant

## 2011-12-17 ENCOUNTER — Encounter: Payer: Self-pay | Admitting: Physician Assistant

## 2011-12-17 ENCOUNTER — Ambulatory Visit (HOSPITAL_COMMUNITY): Payer: Medicare Other

## 2011-12-17 ENCOUNTER — Other Ambulatory Visit: Payer: Medicare Other | Admitting: Lab

## 2011-12-17 VITALS — BP 164/72 | HR 62 | Temp 97.8°F | Resp 20 | Ht 59.0 in | Wt 133.6 lb

## 2011-12-17 DIAGNOSIS — Z170 Estrogen receptor positive status [ER+]: Secondary | ICD-10-CM

## 2011-12-17 DIAGNOSIS — C50519 Malignant neoplasm of lower-outer quadrant of unspecified female breast: Secondary | ICD-10-CM

## 2011-12-17 DIAGNOSIS — J309 Allergic rhinitis, unspecified: Secondary | ICD-10-CM | POA: Diagnosis not present

## 2011-12-17 NOTE — Progress Notes (Signed)
ID: DANNI SHIMA   DOB: 1935/05/24  MR#: [id number]  WUJ#:[id number]  HISTORY OF PRESENT ILLNESS: [name] is a 76 year old [place] woman referred by Dr. Tilda Burrow for evaluation and treatment in the setting of newly diagnosed breast cancer.   The patient had routine screening mammography June 2011 which was unremarkable.  Repeat screening mammography December 11, 2010, at [facility], however, showed a possible abnormality in the left breast.  Additional imaging on October 18th showed an irregular, lobulated mass in the lower outer quadrant of the left breast which by ultrasound was irregular and hypoechoic.  It measured 2.5 cm by ultrasound.  The left axilla showed a benign-looking lymph node which did not appear to have changed compared to as seen in prior mammograms.   With this information, the patient underwent biopsy of the left breast mass October 25th, and this showed [phone] an invasive mammary carcinoma with some lobular features but strongly E-cadherin positive, and so an invasive ductal carcinoma.  There was evidence of perineural invasion, although no definite evidence of angiolymphatic invasion.  The tumor had a CISH ratio of HER-2 to CEP17 signals of 1.15, indicating no amplification.  Estrogen receptor was positive at 100%, progesterone receptor was positive at 61%, and the proliferation marker was 72%.   With this information, the patient was referred to Dr. [name], and bilateral breast MRIs were obtained December 24, 2010.  This showed the mass in the left breast to measure 1.7 cm, to be solitary, to be not contacting the pectoralis, and in addition, there was no suspicious finding in the right breast, no pathologically enlarged axillary or internal mammary chain lymph nodes.   INTERVAL HISTORY: [name] returns today for followup of her left breast carcinoma. She was last seen in our office in September, at which time we decided to hold her letrozole. She was having leg pain and  felt like she was "in a fall". She tells me she has noticed definite improvement in the leg pain since discontinuing the letrozole. She is resting better at night. She's able to do more during the day. She continues, however, to feel somewhat forgetful and does not feel like that has changed at all.  Of note, interval history is also remarkable for [name] having discontinued her Crestor which could have also been contributing to the leg pain. [name] continues to be followed closely by Dr. [name], Dr. Eden Emms, and Dr. Jarold Motto for multiple comorbidities.   REVIEW OF SYSTEMS: [name] is feeling "okay" today. She has had no fevers, chills, or night sweats, and no hot flashes she occasionally has some mild nausea, but no emesis. No recent change in bowel habits. No current abdominal pain. She's had no abnormal bleeding, and denies any blood in the stool. No new cough, phlegm production, shortness of breath, or chest pain. No abnormal headaches or dizziness.  Currently, she denies any unusual myalgias, arthralgias or bony pain. She's had no peripheral swelling.  Otherwise a detailed review of systems today was noncontributory.  PAST MEDICAL HISTORY: Past Medical History  Diagnosis Date  . Unspecified glaucoma(365.9)   . Allergic rhinitis, cause unspecified   . Unspecified asthma   . Unspecified essential hypertension   . Other and unspecified hyperlipidemia   . Gastritis   . Abdominal pain, left lower quadrant   . Diverticulosis of colon (without mention of hemorrhage)   . Irritable bowel syndrome   . Colonic polyp 04-27-2009    tubular adenoma  . Headache   . Dizziness   .  Breast cancer, Left 12/20/2010  . Gallstones   . CAD (coronary artery disease)     a. 01/19/2010 s/p CABG x 3, lima->lad, vg->diag, vg->om1;  b. 06/2011 :Lexi MV: EF 84%, No ischemia.  . Bronchitis     hx of  . Pneumonia   . Hiatal hernia   . Fibromyalgia   . Esophageal reflux     hiatal hernia  Coronary artery disease status  post open heart surgery under Ed Gerhardt November 2011, with bypasses from the left internal mammary artery to the left anterior descending, and then reverse saphenous vein graft to the diagonal coronary artery.  The patient had previously had stents placed by Dr. Eden Emms in 2007.  Other problems include a history of reflux esophagitis, hypertension, hyperlipidemia, rosacea and gallstones.  PAST SURGICAL HISTORY: Past Surgical History  Procedure Date  . Coronary angioplasty with stent placement     Stent 2007  . Open heart surgery     01/19/2010  . Coronary artery bypass graft   . Eye surgery     bilateral cataract removal,  . Breast lumpectomy 02/06/11    left    FAMILY HISTORY Family History  Problem Relation Age of Onset  . Heart disease Brother   . Cancer Brother     gland cancer  . Diabetes Sister   . Breast cancer Sister   . Stroke Mother   . Stroke Father   The patient's father died at the age of 40 following a stroke.  The patient's mother died at 56 with a history of Alzheimer disease.  The patient had 2 sisters, one of whom developed breast cancer at the age of 72 and survives.  The patient has a brother who apparently had lymphoma.  He is also a survivor.  GYNECOLOGIC HISTORY: Menarche age 42.  Menopause in the mid 1990s.  She never used hormone replacement.  She is a GX, P2.  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  Her husband, Maridel Pixler,  has worked as a Curator and is still very active in his shop.   Daughter, [name], lives in [place] and is a home school mom.  Daughter, [name],  lives in [place] and works as a Geophysicist/field seismologist for the [employer].  The patient attends the [employer] in [place].   ADVANCED DIRECTIVES:  HEALTH MAINTENANCE: History  Substance Use Topics  . Smoking status: Never Smoker   . Smokeless tobacco: Never Used  . Alcohol Use: No     Colonoscopy: D.  Patterson  PAP: 2011  Bone density: 2012/[facility]  Lipid panel:  Allergies  Allergen Reactions  . Choline Fenofibrate Other (See Comments)    REACTION: pt states INTOL to Trilipix w/ "thigh burning"  . Simvastatin Other (See Comments)    REACTION: pt states INTOL to STATINS \\T \ refuses to restart  . Hydrocodone     Nightmare after taking cough syrup w/hydrocodone  . Letrozole     Makes patient hurt  . Adhesive (Tape) Rash  . Ceclor (Cefaclor) Rash  . Clarithromycin Rash  . Codeine Nausea Only  . Doxycycline Rash  . Lisinopril Cough    Developed ACE cough...  . Penicillins Itching and Rash    At injection site  . Tobramycin-Dexamethasone Rash    Current Outpatient Prescriptions  Medication Sig Dispense Refill  . aspirin 81 MG tablet Take 81 mg by mouth daily.        [name]  Coenzyme Q10 (COQ10) 100 MG CAPS Take 1 capsule by mouth daily.        . fexofenadine (ALLEGRA) 180 MG tablet Take 180 mg by mouth daily.        . Glucosamine-Chondroit-Vit C-Mn (GLUCOSAMINE CHONDR 1500 COMPLX PO) Take 1 tablet by mouth 3 (three) times daily.        . hyoscyamine (ANASPAZ) 0.125 MG TBDP Place 1 tablet (0.125 mg total) under the tongue every 6 (six) hours as needed.  40 tablet  2  . isometheptene-acetaminophen-dichloralphenazone (MIDRIN) 65-325-100 MG capsule Take 1 capsule by mouth every 4 (four) hours as needed for migraine.  30 capsule  0  . isosorbide mononitrate (IMDUR) 30 MG 24 hr tablet Take 1 tablet (30 mg total) by mouth daily.  90 tablet  3  . letrozole (FEMARA) 2.5 MG tablet Take 2.5 mg by mouth daily.      [name] losartan-hydrochlorothiazide (HYZAAR) 100-12.5 MG per tablet take 1 tablet by mouth once daily  30 tablet  12  . metoprolol (LOPRESSOR) 50 MG tablet take 1 tablet by mouth twice a day  60 tablet  6  . mometasone (NASONEX) 50 MCG/ACT nasal spray 2 sprays by Nasal route daily.        . Olopatadine HCl (PATANASE) 0.6 % SOLN Place 2 puffs into the nose 2 (two) times daily.      . Omega-3  Fatty Acids (FISH OIL) 1200 MG CAPS 2 tabs po bid       . omeprazole (PRILOSEC) 40 MG capsule take 1 capsule by mouth once daily 30 MINUTES BEFORE THE FIRST MEAL OF THE DAY  30 capsule  6  . traMADol (ULTRAM) 50 MG tablet Take 50 mg by mouth as needed. For pain      . travoprost, benzalkonium, (TRAVATAN) 0.004 % ophthalmic solution Place 1 drop into both eyes at bedtime.       . vitamin B-12 (CYANOCOBALAMIN) 1000 MCG tablet Take 1,000 mcg by mouth daily.        . Vitamin D, Ergocalciferol, (DRISDOL) 50000 UNITS CAPS Take 50,000 Units by mouth. Every other week      . nitroGLYCERIN (NITROSTAT) 0.4 MG SL tablet Take one tablet  By mouth SL every 5 minutes for a total of 3 doses as needed for chest pain.  25 tablet  3    OBJECTIVE: Middle-aged white woman in no acute distress Filed Vitals:   12/17/11 0928  BP: 164/72  Pulse: 62  Temp: 97.8 F (36.6 C)  Resp: 20     Body mass index is 26.98 kg/(m^2).    ECOG FS: 1 Filed Weights   12/17/11 0928  Weight: 133 lb 9.6 oz (60.601 kg)   Sclerae unicteric Oropharynx clear No peripheral adenopathy Lungs no rales or rhonchi Heart regular rate and rhythm Abd benign, soft, nontender, nondistended. Positive bowel sounds MSK no focal spinal tenderness No peripheral edema Neuro: nonfocal, alert and oriented x3 Breasts: Deferred.  Axillae are benign bilaterally, no adenopathy  LAB RESULTS: Lab Results  Component Value Date   WBC 6.6 12/11/2011   NEUTROABS 3.2 12/11/2011   HGB 13.5 12/11/2011   HCT 40.9 12/11/2011   MCV 92.1 12/11/2011   PLT 295.0 12/11/2011      Chemistry      Component Value Date/Time   NA 134* 12/11/2011 1112   NA 132* 11/07/2011 1039   K 3.8 12/11/2011 1112   K 4.7 11/07/2011 1039   CL 93* 12/11/2011 1112   CL 96*  11/07/2011 1039   CO2 32 12/11/2011 1112   CO2 27 11/07/2011 1039   BUN 12 12/11/2011 1112   BUN 9.0 11/07/2011 1039   CREATININE 0.5 12/11/2011 1112   CREATININE 0.8 11/07/2011 1039      Component  Value Date/Time   CALCIUM 9.5 12/11/2011 1112   CALCIUM 9.6 11/07/2011 1039   ALKPHOS 72 12/11/2011 1112   ALKPHOS 80 11/07/2011 1039   AST 28 12/11/2011 1112   AST 31 11/07/2011 1039   ALT 31 12/11/2011 1112   ALT 33 11/07/2011 1039   BILITOT 1.2 12/11/2011 1112   BILITOT 0.90 11/07/2011 1039       Lab Results  Component Value Date   LABCA2 20 11/07/2011    STUDIES:  Dg Chest 2 View  12/11/2011  *RADIOLOGY REPORT*  Clinical Data: Chest pain, pressure, cough.  CHEST - 2 VIEW  Comparison: 11/07/2011  Findings: Mild hyperinflation.  Prior CABG.  Heart is upper limits normal in size.  No confluent opacities or effusions.  No acute bony abnormality.  IMPRESSION: Hyperinflation.  No active disease.   Original Report Authenticated By: Cyndie Chime, M.D.    US Abdomen Complete  12/13/2011  *RADIOLOGY REPORT*  Clinical Data:  Chest pain  COMPLETE ABDOMINAL ULTRASOUND  Comparison:  11/07/2011 CT  Findings:  Gallbladder:  No gallstones, gallbladder wall thickening, or pericholecystic fluid.  Common bile duct:  Measures 6 mm, within normal limits.  Liver:  No focal lesion identified.  Within normal limits in parenchymal echogenicity.  IVC:  Appears normal.  Pancreas:  No focal abnormality seen.  Spleen:  Measures 7 cm oblique.  No focal abnormality.  Right Kidney:  Measures 10.6 cm.  No hydronephrosis or focal abnormality.  Left Kidney:  Measures 10.0 cm.  No hydronephrosis or focal anomaly.  Abdominal aorta:  No aneurysm identified.  IMPRESSION: Negative abdominal ultrasound.   Original Report Authenticated By: Waneta Martins, M.D.      ASSESSMENT: 76 y.o. [place] woman   (1)  status post left lumpectomy and sentinel lymph node biopsy December of 2012 for a T2 N0, stage IIA invasive ductal carcinoma, grade 3, HER-2 not amplified, strongly estrogen and progesterone receptor positive with an elevated proliferation marker at 72%; with an Oncotype recurrence score of 21, in the intermediate  range, indicating a 13% and distant disease recurrence rate of 13% within 10 years if the patient took tamoxifen for 5 years.   (2)  She completed radiation  March of 2011 and started letrozole at that time.  PLAN: [name]'s leg pain has in fact improved significantly since holding the letrozole. The problem is that she also discontinued Crestor around the same time, so we still cannot be sure which medication was really the culprit. She would like to retry letrozole, but will contact us immediately if the leg pain returns. At that point, we will discontinue letrozole permanently, and we'll likely prescribed anastrozole in its place. I will see [name] in approximately 6 weeks for repeat followup, at which time we will reassess her tolerance of which have her aromatase inhibitor she is on at that time.   [name] voices understanding and agreement with our plan, and will call with any changes or problems.

## 2011-12-17 NOTE — Telephone Encounter (Signed)
Gave patient appointment for 01-28-2012 starting at 9:15am

## 2011-12-18 DIAGNOSIS — M9981 Other biomechanical lesions of cervical region: Secondary | ICD-10-CM | POA: Diagnosis not present

## 2011-12-18 DIAGNOSIS — M5412 Radiculopathy, cervical region: Secondary | ICD-10-CM | POA: Diagnosis not present

## 2011-12-18 DIAGNOSIS — M5030 Other cervical disc degeneration, unspecified cervical region: Secondary | ICD-10-CM | POA: Diagnosis not present

## 2011-12-18 DIAGNOSIS — M531 Cervicobrachial syndrome: Secondary | ICD-10-CM | POA: Diagnosis not present

## 2011-12-19 DIAGNOSIS — Z853 Personal history of malignant neoplasm of breast: Secondary | ICD-10-CM | POA: Diagnosis not present

## 2011-12-25 DIAGNOSIS — J309 Allergic rhinitis, unspecified: Secondary | ICD-10-CM | POA: Diagnosis not present

## 2011-12-31 DIAGNOSIS — M5412 Radiculopathy, cervical region: Secondary | ICD-10-CM | POA: Diagnosis not present

## 2011-12-31 DIAGNOSIS — M9981 Other biomechanical lesions of cervical region: Secondary | ICD-10-CM | POA: Diagnosis not present

## 2011-12-31 DIAGNOSIS — M5030 Other cervical disc degeneration, unspecified cervical region: Secondary | ICD-10-CM | POA: Diagnosis not present

## 2012-01-03 ENCOUNTER — Telehealth: Payer: Self-pay | Admitting: Pulmonary Disease

## 2012-01-03 NOTE — Telephone Encounter (Signed)
I spoke with pt and she stated she is still having pressure at the back of her head. She states the midrin helps for only a short time and then the pressure is back. She never made an appt at the [facility]. She stated she has been there before and they did nothing for her. She is requesting further recs. Please advise SN thanks Last OV 10/01/11 Pending OV 04/01/11 Allergies  Allergen Reactions  . Choline Fenofibrate Other (See Comments)    REACTION: pt states INTOL to Trilipix w/ "thigh burning"  . Simvastatin Other (See Comments)    REACTION: pt states INTOL to STATINS \\T \ refuses to restart  . Hydrocodone     Nightmare after taking cough syrup w/hydrocodone  . Letrozole     Makes patient hurt  . Adhesive (Tape) Rash  . Ceclor (Cefaclor) Rash  . Clarithromycin Rash  . Codeine Nausea Only  . Doxycycline Rash  . Lisinopril Cough    Developed ACE cough...  . Penicillins Itching and Rash    At injection site  . Tobramycin-Dexamethasone Rash

## 2012-01-03 NOTE — Telephone Encounter (Signed)
Per SN---she will need to follow  Back up with the headache management clinic or neuro appt for a work up.  thanks

## 2012-01-03 NOTE — Telephone Encounter (Signed)
I spoke with pt and is aware of SN recs. She voiced her understanding and needed nothing further 

## 2012-01-08 DIAGNOSIS — J309 Allergic rhinitis, unspecified: Secondary | ICD-10-CM | POA: Diagnosis not present

## 2012-01-09 ENCOUNTER — Ambulatory Visit: Payer: Medicare Other | Admitting: Cardiovascular Disease

## 2012-01-14 DIAGNOSIS — M9981 Other biomechanical lesions of cervical region: Secondary | ICD-10-CM | POA: Diagnosis not present

## 2012-01-14 DIAGNOSIS — M5030 Other cervical disc degeneration, unspecified cervical region: Secondary | ICD-10-CM | POA: Diagnosis not present

## 2012-01-14 DIAGNOSIS — M5412 Radiculopathy, cervical region: Secondary | ICD-10-CM | POA: Diagnosis not present

## 2012-01-15 DIAGNOSIS — J309 Allergic rhinitis, unspecified: Secondary | ICD-10-CM | POA: Diagnosis not present

## 2012-01-17 ENCOUNTER — Encounter: Payer: Self-pay | Admitting: Cardiovascular Disease

## 2012-01-17 ENCOUNTER — Ambulatory Visit (INDEPENDENT_AMBULATORY_CARE_PROVIDER_SITE_OTHER): Payer: Medicare Other | Admitting: Cardiovascular Disease

## 2012-01-17 VITALS — BP 164/80 | HR 60 | Ht 59.0 in | Wt 133.0 lb

## 2012-01-17 DIAGNOSIS — E785 Hyperlipidemia, unspecified: Secondary | ICD-10-CM | POA: Diagnosis not present

## 2012-01-17 DIAGNOSIS — I2510 Atherosclerotic heart disease of native coronary artery without angina pectoris: Secondary | ICD-10-CM | POA: Diagnosis not present

## 2012-01-17 DIAGNOSIS — C50519 Malignant neoplasm of lower-outer quadrant of unspecified female breast: Secondary | ICD-10-CM | POA: Diagnosis not present

## 2012-01-17 DIAGNOSIS — I10 Essential (primary) hypertension: Secondary | ICD-10-CM

## 2012-01-17 NOTE — Assessment & Plan Note (Signed)
Rx limited by side effects to statin and fibrates.

## 2012-01-17 NOTE — Assessment & Plan Note (Signed)
Well controlled.  Continue current medications and low sodium Dash type diet.    

## 2012-01-17 NOTE — Assessment & Plan Note (Signed)
Stable with no angina and good activity level.  Continue medical Rx  

## 2012-01-17 NOTE — Progress Notes (Signed)
Patient ID: [name], female   DOB: 1935-09-18, 76 y.o.   MRN: [id number] [name] is seen today post F/U for CAD with instent restenosis and CABG with Dr Tyrone Sage 01/19/10 .  CRF;s include HTN and elevated lipids. Rx limited by side effects. Cough with ACE and myalgias with higher dose statins. She still has issues with gallstones. Prior to her CABG she had documented stones by [place] and has seen Dr Jarold Motto and Andrey Campanile. Recent headaches and bronchitis followed by Dr [name].   Recent clinic visit with good labs and tolerating lower dose statin She had been having issues with her BP meds but separating them in time seems to have solved this.   Myovue 07/15/11 reviewed and normal with no ischemia and EF over 80%  CT 9/12 no cholycystitis   Recently diagjosed breast CA  On letrozole  Cannot take cholesterol pills with this makes her too sore    ROS: Denies fever, malais, weight loss, blurry vision, decreased visual acuity, cough, sputum, SOB, hemoptysis, pleuritic pain, palpitaitons, heartburn, abdominal pain, melena, lower extremity edema, claudication, or rash.  All other systems reviewed and negative  General: Affect appropriate Overweight female HEENT: normal Neck supple with no adenopathy JVP normal no bruits no thyromegaly Lungs clear with no wheezing and good diaphragmatic motion Heart:  S1/S2 no murmur, no rub, gallop or click PMI normal Abdomen: benighn, BS positve, no tenderness, no AAA no bruit.  No HSM or HJR Distal pulses intact with no bruits No edema Neuro non-focal Skin warm and dry No muscular weakness   Current Outpatient Prescriptions  Medication Sig Dispense Refill  . aspirin 81 MG tablet Take 81 mg by mouth daily.        . Coenzyme Q10 (COQ10) 100 MG CAPS Take 1 capsule by mouth daily.        . fexofenadine (ALLEGRA) 180 MG tablet Take 180 mg by mouth daily.        . Glucosamine-Chondroit-Vit C-Mn (GLUCOSAMINE CHONDR 1500 COMPLX PO) Take 1 tablet by mouth 3  (three) times daily.        . hyoscyamine (ANASPAZ) 0.125 MG TBDP Place 1 tablet (0.125 mg total) under the tongue every 6 (six) hours as needed.  40 tablet  2  . isometheptene-acetaminophen-dichloralphenazone (MIDRIN) 65-325-100 MG capsule Take 1 capsule by mouth every 4 (four) hours as needed for migraine.  30 capsule  0  . isosorbide mononitrate (IMDUR) 30 MG 24 hr tablet Take 1 tablet (30 mg total) by mouth daily.  90 tablet  3  . letrozole (FEMARA) 2.5 MG tablet Take 2.5 mg by mouth daily.      [name] losartan-hydrochlorothiazide (HYZAAR) 100-12.5 MG per tablet take 1 tablet by mouth once daily  30 tablet  12  . metoprolol (LOPRESSOR) 50 MG tablet take 1 tablet by mouth twice a day  60 tablet  6  . mometasone (NASONEX) 50 MCG/ACT nasal spray 2 sprays by Nasal route daily.        . nitroGLYCERIN (NITROSTAT) 0.4 MG SL tablet Take one tablet  By mouth SL every 5 minutes for a total of 3 doses as needed for chest pain.  25 tablet  3  . Olopatadine HCl (PATANASE) 0.6 % SOLN Place 2 puffs into the nose 2 (two) times daily.      . Omega-3 Fatty Acids (FISH OIL) 1200 MG CAPS 2 tabs po bid       . omeprazole (PRILOSEC) 40 MG capsule take 1 capsule by mouth once  daily 30 MINUTES BEFORE THE FIRST MEAL OF THE DAY  30 capsule  6  . traMADol (ULTRAM) 50 MG tablet Take 50 mg by mouth as needed. For pain      . travoprost, benzalkonium, (TRAVATAN) 0.004 % ophthalmic solution Place 1 drop into both eyes at bedtime.       . vitamin B-12 (CYANOCOBALAMIN) 1000 MCG tablet Take 1,000 mcg by mouth daily.        . Vitamin D, Ergocalciferol, (DRISDOL) 50000 UNITS CAPS Take 50,000 Units by mouth. Every other week        Allergies  Choline fenofibrate; Simvastatin; Hydrocodone; Letrozole; Adhesive; Ceclor; Clarithromycin; Codeine; Doxycycline; Lisinopril; Penicillins; and Tobramycin-dexamethasone  Electrocardiogram:  11/13/11  SR rate 64 nonspecific ST/T changes  Assessment and Plan

## 2012-01-17 NOTE — Assessment & Plan Note (Signed)
Post lumpectomy and XRT  Now on letrozole  F/U oncology

## 2012-01-17 NOTE — Patient Instructions (Addendum)
The current medical regimen is effective;  continue present plan and medications.  Follow up in 6 months with Dr Nishan.  You will receive a letter in the mail 2 months before you are due.  Please call us when you receive this letter to schedule your follow up appointment.   

## 2012-01-20 DIAGNOSIS — J309 Allergic rhinitis, unspecified: Secondary | ICD-10-CM | POA: Diagnosis not present

## 2012-01-28 ENCOUNTER — Telehealth: Payer: Self-pay | Admitting: Oncology

## 2012-01-28 ENCOUNTER — Other Ambulatory Visit (HOSPITAL_BASED_OUTPATIENT_CLINIC_OR_DEPARTMENT_OTHER): Payer: Medicare Other | Admitting: Lab

## 2012-01-28 ENCOUNTER — Encounter: Payer: Self-pay | Admitting: Oncology

## 2012-01-28 ENCOUNTER — Ambulatory Visit (HOSPITAL_BASED_OUTPATIENT_CLINIC_OR_DEPARTMENT_OTHER): Payer: Medicare Other | Admitting: Oncology

## 2012-01-28 VITALS — BP 139/62 | HR 63 | Temp 96.9°F | Resp 20 | Ht 59.0 in | Wt 133.4 lb

## 2012-01-28 DIAGNOSIS — J309 Allergic rhinitis, unspecified: Secondary | ICD-10-CM | POA: Diagnosis not present

## 2012-01-28 DIAGNOSIS — C50519 Malignant neoplasm of lower-outer quadrant of unspecified female breast: Secondary | ICD-10-CM

## 2012-01-28 LAB — CBC WITH DIFFERENTIAL/PLATELET
BASO%: 1.5 % (ref 0.0–2.0)
HCT: 37.8 % (ref 34.8–46.6)
LYMPH%: 39.9 % (ref 14.0–49.7)
MCH: 30.9 pg (ref 25.1–34.0)
MCHC: 34.1 g/dL (ref 31.5–36.0)
MONO#: 0.6 10*3/uL (ref 0.1–0.9)
NEUT%: 44.8 % (ref 38.4–76.8)
Platelets: 270 10*3/uL (ref 145–400)

## 2012-01-28 MED ORDER — LETROZOLE 2.5 MG PO TABS: 2.5000 mg | ORAL_TABLET | Freq: Every day | ORAL | Status: DC

## 2012-01-28 NOTE — Progress Notes (Signed)
ID: [name]   DOB: [birth year]  MR#: [id number]  WUJ#:[id number]  HISTORY OF PRESENT ILLNESS: [name] is a 76 year old [place] woman referred by Dr. Tilda Burrow for evaluation and treatment in the setting of newly diagnosed breast cancer.   The patient had routine screening mammography June 2011 which was unremarkable.  Repeat screening mammography December 11, 2010, at [facility], however, showed a possible abnormality in the left breast.  Additional imaging on October 18th showed an irregular, lobulated mass in the lower outer quadrant of the left breast which by ultrasound was irregular and hypoechoic.  It measured 2.5 cm by ultrasound.  The left axilla showed a benign-looking lymph node which did not appear to have changed compared to as seen in prior mammograms.   With this information, the patient underwent biopsy of the left breast mass October 25th, and this showed [phone] an invasive mammary carcinoma with some lobular features but strongly E-cadherin positive, and so an invasive ductal carcinoma.  There was evidence of perineural invasion, although no definite evidence of angiolymphatic invasion.  The tumor had a CISH ratio of HER-2 to CEP17 signals of 1.15, indicating no amplification.  Estrogen receptor was positive at 100%, progesterone receptor was positive at 61%, and the proliferation marker was 72%.   With this information, the patient was referred to Dr. [name], and bilateral breast MRIs were obtained December 24, 2010.  This showed the mass in the left breast to measure 1.7 cm, to be solitary, to be not contacting the pectoralis, and in addition, there was no suspicious finding in the right breast, no pathologically enlarged axillary or internal mammary chain lymph nodes.   INTERVAL HISTORY: [name] returns today for followup of her left breast carcinoma. She was last seen in our office in October and was restarted on letrozole at that time. Letrozole had previously been  placed on hold for 6 weeks due to leg pain. She had also been on Crestor and stopped the Crestor and Letrozole around the same time. She has remained off the Crestor. Since resuming the letrozole, she has been doing well. Leg pain has not recurred. She has mild aches and pains that she attributes to her arthritis. She is resting better at night. She's able to do more during the day. She continues, however, to feel somewhat forgetful and does not feel like that has changed at all.    REVIEW OF SYSTEMS: [name] is feeling well today. She has had no fevers, chills, or night sweats, and no hot flashes she occasionally has some mild nausea, but no emesis. No recent change in bowel habits. No current abdominal pain. She's had no abnormal bleeding, and denies any blood in the stool. No new cough, phlegm production, shortness of breath, or chest pain. No abnormal headaches or dizziness.  Currently, she denies any unusual myalgias, arthralgias or bony pain. She's had no peripheral swelling.  Otherwise a detailed review of systems today was noncontributory.  PAST MEDICAL HISTORY: Past Medical History  Diagnosis Date  . Unspecified glaucoma(365.9)   . Allergic rhinitis, cause unspecified   . Unspecified asthma   . Unspecified essential hypertension   . Other and unspecified hyperlipidemia   . Gastritis   . Abdominal pain, left lower quadrant   . Diverticulosis of colon (without mention of hemorrhage)   . Irritable bowel syndrome   . Colonic polyp 04-27-2009    tubular adenoma  . Headache   . Dizziness   . Breast cancer, Left 12/20/2010  . Gallstones   .  CAD (coronary artery disease)     a. 01/19/2010 s/p CABG x 3, lima->lad, vg->diag, vg->om1;  b. 06/2011 :Lexi MV: EF 84%, No ischemia.  . Bronchitis     hx of  . Pneumonia   . Hiatal hernia   . Fibromyalgia   . Esophageal reflux     hiatal hernia  Coronary artery disease status post open heart surgery under Ed Gerhardt November 2011, with bypasses  from the left internal mammary artery to the left anterior descending, and then reverse saphenous vein graft to the diagonal coronary artery.  The patient had previously had stents placed by Dr. Eden Emms in 2007.  Other problems include a history of reflux esophagitis, hypertension, hyperlipidemia, rosacea and gallstones.  PAST SURGICAL HISTORY: Past Surgical History  Procedure Date  . Coronary angioplasty with stent placement     Stent 2007  . Open heart surgery     01/19/2010  . Coronary artery bypass graft   . Eye surgery     bilateral cataract removal,  . Breast lumpectomy 02/06/11    left    FAMILY HISTORY Family History  Problem Relation Age of Onset  . Heart disease Brother   . Cancer Brother     gland cancer  . Diabetes Sister   . Breast cancer Sister   . Stroke Mother   . Stroke Father   The patient's father died at the age of 80 following a stroke.  The patient's mother died at 23 with a history of Alzheimer disease.  The patient had 2 sisters, one of whom developed breast cancer at the age of 56 and survives.  The patient has a brother who apparently had lymphoma.  He is also a survivor.  GYNECOLOGIC HISTORY: Menarche age 46.  Menopause in the mid 1990s.  She never used hormone replacement.  She is a GX, P2.  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  Her husband, [name],  has worked as a Curator and is still very active in his shop.   Daughter, [name], lives in [place] and is a home school mom.  Daughter, [name],  lives in [place] and works as a Geophysicist/field seismologist for the [employer].  The patient attends the [employer] in [place].   ADVANCED DIRECTIVES:  HEALTH MAINTENANCE: History  Substance Use Topics  . Smoking status: Never Smoker   . Smokeless tobacco: Never Used  . Alcohol Use: No     Colonoscopy: D. Patterson  PAP: 2011  Bone density: 2012/[facility]  Lipid panel:  Allergies   Allergen Reactions  . Choline Fenofibrate Other (See Comments)    REACTION: pt states INTOL to Trilipix w/ "thigh burning"  . Simvastatin Other (See Comments)    REACTION: pt states INTOL to STATINS \\T \ refuses to restart  . Hydrocodone     Nightmare after taking cough syrup w/hydrocodone  . Adhesive (Tape) Rash  . Ceclor (Cefaclor) Rash  . Clarithromycin Rash  . Codeine Nausea Only  . Doxycycline Rash  . Lisinopril Cough    Developed ACE cough...  . Penicillins Itching and Rash    At injection site  . Tobramycin-Dexamethasone Rash    Current Outpatient Prescriptions  Medication Sig Dispense Refill  . aspirin 81 MG tablet Take 81 mg by mouth daily.        . Calcium Carbonate-Vitamin D (CALTRATE 600+D) 600-400 MG-UNIT per chew tablet Chew 1 tablet by mouth daily.      [name]  Coenzyme Q10 (COQ10) 100 MG CAPS Take 1 capsule by mouth daily.        . fexofenadine (ALLEGRA) 180 MG tablet Take 180 mg by mouth daily.        . Glucosamine-Chondroit-Vit C-Mn (GLUCOSAMINE CHONDR 1500 COMPLX PO) Take 1 tablet by mouth 3 (three) times daily.        . hyoscyamine (ANASPAZ) 0.125 MG TBDP Place 1 tablet (0.125 mg total) under the tongue every 6 (six) hours as needed.  40 tablet  2  . isometheptene-acetaminophen-dichloralphenazone (MIDRIN) 65-325-100 MG capsule Take 1 capsule by mouth every 4 (four) hours as needed for migraine.  30 capsule  0  . isosorbide mononitrate (IMDUR) 30 MG 24 hr tablet Take 1 tablet (30 mg total) by mouth daily.  90 tablet  3  . letrozole (FEMARA) 2.5 MG tablet Take 1 tablet (2.5 mg total) by mouth daily.  30 tablet  5  . losartan-hydrochlorothiazide (HYZAAR) 100-12.5 MG per tablet take 1 tablet by mouth once daily  30 tablet  12  . metoprolol (LOPRESSOR) 50 MG tablet take 1 tablet by mouth twice a day  60 tablet  6  . mometasone (NASONEX) 50 MCG/ACT nasal spray 2 sprays by Nasal route daily.        . nitroGLYCERIN (NITROSTAT) 0.4 MG SL tablet Take one tablet  By mouth SL  every 5 minutes for a total of 3 doses as needed for chest pain.  25 tablet  3  . Olopatadine HCl (PATANASE) 0.6 % SOLN Place 2 puffs into the nose 2 (two) times daily.      . Omega-3 Fatty Acids (FISH OIL) 1200 MG CAPS 2 tabs po bid       . omeprazole (PRILOSEC) 40 MG capsule take 1 capsule by mouth once daily 30 MINUTES BEFORE THE FIRST MEAL OF THE DAY  30 capsule  6  . traMADol (ULTRAM) 50 MG tablet Take 50 mg by mouth as needed. For pain      . travoprost, benzalkonium, (TRAVATAN) 0.004 % ophthalmic solution Place 1 drop into both eyes at bedtime.       . vitamin B-12 (CYANOCOBALAMIN) 1000 MCG tablet Take 1,000 mcg by mouth daily.        . Vitamin D, Ergocalciferol, (DRISDOL) 50000 UNITS CAPS Take 50,000 Units by mouth. Every other week        OBJECTIVE: Middle-aged white woman in no acute distress Filed Vitals:   01/28/12 0959  BP: 139/62  Pulse: 63  Temp: 96.9 F (36.1 C)  Resp: 20     Body mass index is 26.94 kg/(m^2).    ECOG FS: 1 Filed Weights   01/28/12 0959  Weight: 133 lb 6.4 oz (60.51 kg)   Sclerae unicteric Oropharynx clear No peripheral adenopathy Lungs no rales or rhonchi Heart regular rate and rhythm Abd benign, soft, nontender, nondistended. Positive bowel sounds MSK no focal spinal tenderness No peripheral edema Neuro: nonfocal, alert and oriented x3 Breasts: Deferred.  Axillae are benign bilaterally, no adenopathy  LAB RESULTS: Lab Results  Component Value Date   WBC 5.9 01/28/2012   NEUTROABS 2.7 01/28/2012   HGB 12.9 01/28/2012   HCT 37.8 01/28/2012   MCV 90.8 01/28/2012   PLT 270 01/28/2012      Chemistry      Component Value Date/Time   NA 134* 12/11/2011 1112   NA 132* 11/07/2011 1039   K 3.8 12/11/2011 1112   K 4.7 11/07/2011 1039   CL 93* 12/11/2011 1112  CL 96* 11/07/2011 1039   CO2 32 12/11/2011 1112   CO2 27 11/07/2011 1039   BUN 12 12/11/2011 1112   BUN 9.0 11/07/2011 1039   CREATININE 0.5 12/11/2011 1112   CREATININE 0.8 11/07/2011 1039       Component Value Date/Time   CALCIUM 9.5 12/11/2011 1112   CALCIUM 9.6 11/07/2011 1039   ALKPHOS 72 12/11/2011 1112   ALKPHOS 80 11/07/2011 1039   AST 28 12/11/2011 1112   AST 31 11/07/2011 1039   ALT 31 12/11/2011 1112   ALT 33 11/07/2011 1039   BILITOT 1.2 12/11/2011 1112   BILITOT 0.90 11/07/2011 1039       Lab Results  Component Value Date   LABCA2 20 11/07/2011    ASSESSMENT: 75 y.o. [place] woman   (1)  status post left lumpectomy and sentinel lymph node biopsy December of 2012 for a T2 N0, stage IIA invasive ductal carcinoma, grade 3, HER-2 not amplified, strongly estrogen and progesterone receptor positive with an elevated proliferation marker at 72%; with an Oncotype recurrence score of 21, in the intermediate range, indicating a 13% and distant disease recurrence rate of 13% within 10 years if the patient took tamoxifen for 5 years.   (2)  She completed radiation  March of 2011 and started letrozole at that time.  PLAN: [name]'s leg pain has not recurred since resuming letrozole. Of note, she has not restarted the Crestor which may have been the cause of her leg pain. She is doing well on the letrozole at this point and I have recommended that she continue on this medication. If her pain returns, then may consider stopping letrozole permanently, and will likely prescribed anastrozole in its place. Plan is to see the patient back in 6 months since she is doing well as outlined by Dr Darnelle Catalan in his previous note. I have given her a refill of her Letrozole today. Last mammogram was 12/19/11 and next one is due October 2014.   [name] voices understanding and agreement with our plan, and will call with any changes or problems.

## 2012-01-28 NOTE — Telephone Encounter (Signed)
gv and printed appt schedule to pt for June 2014 °

## 2012-01-29 DIAGNOSIS — M5412 Radiculopathy, cervical region: Secondary | ICD-10-CM | POA: Diagnosis not present

## 2012-01-29 DIAGNOSIS — M5030 Other cervical disc degeneration, unspecified cervical region: Secondary | ICD-10-CM | POA: Diagnosis not present

## 2012-01-29 DIAGNOSIS — M9981 Other biomechanical lesions of cervical region: Secondary | ICD-10-CM | POA: Diagnosis not present

## 2012-02-11 DIAGNOSIS — M5412 Radiculopathy, cervical region: Secondary | ICD-10-CM | POA: Diagnosis not present

## 2012-02-11 DIAGNOSIS — M5030 Other cervical disc degeneration, unspecified cervical region: Secondary | ICD-10-CM | POA: Diagnosis not present

## 2012-02-11 DIAGNOSIS — M9981 Other biomechanical lesions of cervical region: Secondary | ICD-10-CM | POA: Diagnosis not present

## 2012-02-17 DIAGNOSIS — J309 Allergic rhinitis, unspecified: Secondary | ICD-10-CM | POA: Diagnosis not present

## 2012-02-27 DIAGNOSIS — J309 Allergic rhinitis, unspecified: Secondary | ICD-10-CM | POA: Diagnosis not present

## 2012-03-03 DIAGNOSIS — M5412 Radiculopathy, cervical region: Secondary | ICD-10-CM | POA: Diagnosis not present

## 2012-03-03 DIAGNOSIS — R51 Headache: Secondary | ICD-10-CM | POA: Diagnosis not present

## 2012-03-03 DIAGNOSIS — M9981 Other biomechanical lesions of cervical region: Secondary | ICD-10-CM | POA: Diagnosis not present

## 2012-03-03 DIAGNOSIS — M5030 Other cervical disc degeneration, unspecified cervical region: Secondary | ICD-10-CM | POA: Diagnosis not present

## 2012-03-05 DIAGNOSIS — J309 Allergic rhinitis, unspecified: Secondary | ICD-10-CM | POA: Diagnosis not present

## 2012-03-10 DIAGNOSIS — J309 Allergic rhinitis, unspecified: Secondary | ICD-10-CM | POA: Diagnosis not present

## 2012-03-17 DIAGNOSIS — M9981 Other biomechanical lesions of cervical region: Secondary | ICD-10-CM | POA: Diagnosis not present

## 2012-03-17 DIAGNOSIS — M5030 Other cervical disc degeneration, unspecified cervical region: Secondary | ICD-10-CM | POA: Diagnosis not present

## 2012-03-17 DIAGNOSIS — M5412 Radiculopathy, cervical region: Secondary | ICD-10-CM | POA: Diagnosis not present

## 2012-03-19 DIAGNOSIS — J309 Allergic rhinitis, unspecified: Secondary | ICD-10-CM | POA: Diagnosis not present

## 2012-03-26 ENCOUNTER — Telehealth: Payer: Self-pay | Admitting: Cardiovascular Disease

## 2012-03-26 ENCOUNTER — Other Ambulatory Visit: Payer: Self-pay

## 2012-03-26 MED ORDER — METOPROLOL TARTRATE 50 MG PO TABS: 50.0000 mg | ORAL_TABLET | Freq: Two times a day (BID) | ORAL | Status: DC

## 2012-03-26 NOTE — Telephone Encounter (Signed)
New Problem:    Patient called in needing a refill of her metoprolol (LOPRESSOR) 50 MG tablet sent to the pharmacy listed on her profile.

## 2012-03-27 DIAGNOSIS — J309 Allergic rhinitis, unspecified: Secondary | ICD-10-CM | POA: Diagnosis not present

## 2012-03-31 ENCOUNTER — Encounter: Payer: Self-pay | Admitting: Pulmonary Disease

## 2012-03-31 ENCOUNTER — Ambulatory Visit (INDEPENDENT_AMBULATORY_CARE_PROVIDER_SITE_OTHER): Payer: Medicare Other | Admitting: Pulmonary Disease

## 2012-03-31 VITALS — BP 144/72 | HR 65 | Temp 97.2°F | Ht 60.0 in | Wt 135.2 lb

## 2012-03-31 DIAGNOSIS — I2510 Atherosclerotic heart disease of native coronary artery without angina pectoris: Secondary | ICD-10-CM

## 2012-03-31 DIAGNOSIS — K219 Gastro-esophageal reflux disease without esophagitis: Secondary | ICD-10-CM

## 2012-03-31 DIAGNOSIS — J309 Allergic rhinitis, unspecified: Secondary | ICD-10-CM | POA: Diagnosis not present

## 2012-03-31 DIAGNOSIS — I10 Essential (primary) hypertension: Secondary | ICD-10-CM | POA: Diagnosis not present

## 2012-03-31 DIAGNOSIS — C50519 Malignant neoplasm of lower-outer quadrant of unspecified female breast: Secondary | ICD-10-CM

## 2012-03-31 DIAGNOSIS — K589 Irritable bowel syndrome without diarrhea: Secondary | ICD-10-CM

## 2012-03-31 DIAGNOSIS — K449 Diaphragmatic hernia without obstruction or gangrene: Secondary | ICD-10-CM

## 2012-03-31 DIAGNOSIS — J45909 Unspecified asthma, uncomplicated: Secondary | ICD-10-CM

## 2012-03-31 DIAGNOSIS — K5730 Diverticulosis of large intestine without perforation or abscess without bleeding: Secondary | ICD-10-CM

## 2012-03-31 DIAGNOSIS — E785 Hyperlipidemia, unspecified: Secondary | ICD-10-CM

## 2012-03-31 DIAGNOSIS — F411 Generalized anxiety disorder: Secondary | ICD-10-CM

## 2012-03-31 DIAGNOSIS — K8020 Calculus of gallbladder without cholecystitis without obstruction: Secondary | ICD-10-CM

## 2012-03-31 DIAGNOSIS — IMO0001 Reserved for inherently not codable concepts without codable children: Secondary | ICD-10-CM

## 2012-03-31 DIAGNOSIS — F419 Anxiety disorder, unspecified: Secondary | ICD-10-CM

## 2012-03-31 DIAGNOSIS — R51 Headache: Secondary | ICD-10-CM

## 2012-03-31 MED ORDER — ISOMETHEPTENE-APAP-DICHLORAL 65-325-100 MG PO CAPS: 1.0000 | ORAL_CAPSULE | Freq: Four times a day (QID) | ORAL | Status: DC | PRN

## 2012-03-31 NOTE — Progress Notes (Signed)
Subjective:    Patient ID: [name], female    DOB: August 10, 1935, 77 y.o.   MRN: [id number]  HPI 77 y/o WF here for a follow up visit... she has multiple medical problems including AR & Asthma;  HBP;  CAD followed by Walker Kehr & s/p 3 vessel CABG 11/11 by DrGearhardt;  Hyperlipidemia followed in the [facility];  GERD/ IBS/ Divertics;  Breast Cancer diagnosed 12/12;  Fibromyalgia;  Chr HAs & dizziness...  ~  April 02, 2011:  13mo ROV & [name] has had another event filled interval> this time w/ mammogram showing a left breast nodule & surg (partial mastectomy & sentinel node bx) by [name] 12/12 that proved to be an invasive ductal carcinoma, 2.3cm size, w/ neg lymphovasc invasion, clear margins (but close at 1mm), & neg nodes;  subseq XRT by DrKindard (had her 11th treatment today); and Oncology eval by DrMagrinat who plans hormone Rx later (no chemo she says)... She is tolerating treatment well & spirits are up, [name] had to aspirate a seroma & it is now resolved...    Pat had her usual autumn URI, AB exac in Nov RX w/ ZPak, Pred, Mucinex, Hydromet but reacted to the hydrocodone & prefers OTC non-narcotic cough suppression & we discussed MUCINEX DM & DELSYM (offered Tussionex if ineffective);  She had the Flu vaccine 10/12...     She denies CP palpit, dizzy, syncope, incr SOB, edema, etc; Cardiac stable & she is thrilled w/ that...    She continues f/u in the [facility]id Clinic & has been titrated up to Cres10mg  M&F, plus 5mg  on Wednes; last FLP 10/12 was the best yet w/ TChol 167, TG 203, HDL 54, LDL 88...   ~  October 01, 2011:  13mo ROV & [name] persists w/ mult somatic complaints> eg. Cramp under left breast ("knots up", thinks it's a hernia); intermittent right arm numbness & in the middle 3 fingers; she has chr fibromyalgia & uses Tramadol/ Tylenol as needed...    She saw DrVanWinkle 7/13 for her allergies> on allergy shots weekly, Xopenex inhaler as needed, Allegra prn, Nasonex daily and doing well...  She saw Cards 6/13 w/ known CAD, s/p CABG & intermittent CWP likely related to her FM; neg stress test- no ischemia, good LVF; she is walking some & notes "pressure" on hills...    She had f/u DrMagrinat 6/13 & he wondered if her systemic symptoms might not be related to Femara Rx...    We reviewed prob list, meds, xrays and labs> see below for updates >> requests Shingles vaccine Rx (lost prev Rx)... CXR 5/13 showed s/p CABG, clear lungs, NAD...  LABS 6/13:  Chems- ok;  CBC- wnl;  Ca27.29=20...  ~  March 31, 2012:  13mo ROV & [name] admits that she's not feeling well today- c/o occipHA w/ band-like extension around her head, feels funny- sl dizzy & light headed, occuring daily; she has Tramadol for prn use but she says she doesn't like taking it- prefers Midrin (refilled), offered Neuro/HA referral & she will consider it...     AR/ Asthma>  She stopped the Symbicort as well & not on any inhaled meds at present; denies cough, sput, ch in dyspnea, etc.    HBP>  controlled on Metop50Bid & Hyzaar100-12.5 w/ BP=144/72 today; denies visual changes, CP, palipit, dizziness, syncope, dyspnea, edema, etc...     CAD>  CP 11/11=> cath w/ in-stent restenosis, then CABG x3 by DrGearhardt; re-hospitalized 6/12 by Cards for CP> cath showed severe 3 vessel  CAD w/ 3/3 grafts patent & norm LVF w/ EF= 65-70%; Myoview 5/13 showed no ischemia & EF>80%; no change in meds- on Imdur30; & DrNishan noted that med rx is lim by side effects.    Hyperlipid>  followed in the [facility] & FLP reviewed on Cres10- 2d per week but she was unable to tolerate even this low dose; off all meds on diet alone...    GI- GERD, Divertics> on Omep40; followed by DrPatterson & seen 9/13 f/u for ?diverticulitis- improved after Cipro/Flagyl but CTAbd was neg x diverticulosis; he rec high fiber diet & Citrucel...    Hx left breast cancer> she had f/u Oncology 12/13- back on Femara & tol ok; last mammogram 10/13 was ok as well...    Others>  she has persistant  FM symptoms and chr HAs, uses Vicodin Prn... We reviewed prob list, meds, xrays and labs> see below for updates >> she had the 2013 Flu vaccine 10/13...  EKG 9/13 showed NSR, rate64, NSSTTWA, NAD...  CXR 10/13 showed heart at upper lim of norm, prior CABG, mild hyperinflation, clear, NAD... Abd Ultrasound 10/13 by Walker Kehr for eval CP showed> norm GB, liver, kidneys, & Ao- neg sonar... LABS 10-12/13 showed Chems- wnl;  CBC- wnl LABS 2/14:  FLP- ok x LDL=110;  TSH=1.33           Problem List:  GLAUCOMA (ICD-365.9) - she is sched for right cataract surg by DrBevis in Feb...  ALLERGIC RHINITIS (ICD-477.9) - she uses Allegra & Nasonex Prn; on Allergy shots weekly x yrs...  ASTHMA & Asthmatic Bronchitis >> off Symbicort & using XOPENEX HFA as needed rescue inhaler... ~  5/11:  notes incr chest symptoms in the heat & rec to take the Symbicort 2sp Bid... ~  2012:  After her CABG & repeat hosp by Cards she is noted to be off her inhalers... ~  10/12:  Treated w/ Zpak, Pred, Mucinex, plus her Symbicort80 & improved... ~  CXR 5/13 showed s/p CABG, clear lungs, NAD (she went to the Er w/ cough & had neg eval). ~  7/13:  DrVanWinkle stopped her Symbicort & switched to [facility] HFA as needed... ~  2/14: she does not list any inhaled meds at present & states breathing OK w/o exac...  HYPERTENSION (ICD-401.9) - controlled on TOPROL 50mg Bid & HYZAAR 100-12.5 daily (off Lisinopril due to ACE cough)...  ~  8/12: BP=  128/84 & tol meds well; denies HA, fatigue, visual changes, CP, palipit, syncope, dyspnea, edema, etc... ~  2/13: BP= 140/64 & she remains essentially asymptomatic on Rx... ~  8/13:  BP= 132/64 & she denies CP, palpit, SOB, edema, etc... ~  2/14: controlled on Toprol & Hyzaar w/ BP 144/72 today; denies visual changes, CP, palipit, dizziness, syncope, dyspnea, edema, etc...  CORONARY ARTERY DISEASE (ICD-414.00) - on ASA 81mg /d (intol to 325 she says) & off Plavix per Walker Kehr...  Cardiolite  9/08 showed CP & HBP response, EF+85%... had cath 10/08 w/ single vessel dis w/ mod ostial LAD stenosis and patent LAD stent, normal LVF.[name]. option for med Rx vs off pump LIMA... she notes some CP/ pressure "burning" when walking up a certain hill during her daily walks, but states this is stable and "no worse"... ~  Myoview 9/09 was neg- no scar or ischemia, EF= 89%, hypertensive response- Lisinopril10 added. ~  f/u Myoview 3/11 was neg- no scar or ischemia, EF= 86%, norm BP response, fair exerc capacity. ~  saw Walker Kehr 5/11- no CP, he stopped her  Plavix, stable- continue other meds same. ~  She had recurrent CP 11/11 w/ in-stent restenosis on cath> subseq 3 vessel CABG by DrGearhart & doing well since then... ~  6/12:  Hosp w/ CP & repeat cath showed CAD & 3/3 grafts patent, EF= 65-70% ~  Myoview 5/13 was felt to be a normal stress nuclear study- normal wall motion, no ischemia, EF=84% ~  2/14: CP 11/11=> cath w/ in-stent restenosis, then CABG x3 by DrGearhardt; re-hospitalized 6/12 by [facility] for CP> cath showed severe 3 vessel CAD w/ 3/3 grafts patent & norm LVF w/ EF= 65-70%; Myoview 5/13 showed no ischemia & EF>80%; no change in meds; & Walker Kehr noted that med rx is limited by side effects...  HYPERLIPIDEMIA (ICD-272.4) - on CRESTOR 10mg - 1/2 on M&F, FISH OIL 1200mg  Tid + Flax Seed Oil... she has been intol to statins in the past>  tried low dose Trilipix but stopped this due to "thigh burning">  now followed in the Lipid Clinic--- ~  FLP 7/08 showed TChol 217, TG 236, HDL 46, LDL 150...  ~  FLP 6/09 showed TChol 233, TG 263, HDL 46, LDL 150... she agrees to try LOW DOSE Trilipix. ~  FLP 11/09 showed TChol 162, TG 167, HDL 45, LDL 83... ~  FLP 11/10 showed TChol 240, TG 271, HDL 45, LDL 150... LipClin Rx w/ FishOil & FlaxSeedOil. ~  FLP 2/11 showed TChol 285, TG 156, HDL 79, LDL 180... LC is aware & working w/ her regularly. ~  FLP 5/11 showed TChol 277, TG 241, HDL55, LDL 193...  LC added Cres10  just 2d per week. ~  FLP 10/11 showed TChol 198, TG 215, HDL 53, LDL 109... much improved on Cres10 twice weekly. ~  She continues to f/u w/ Lipid clinic & they titrated up the Cres10 on M&F, plus 5mg  on Wednes> ~  FLP  10/12 was the best yet w/ TChol 167, TG 203, HDL 54, LDL 88...  ~  FLP 4/13 showed TChol 193, TG 176, HDL 57, LDL 101 ~  She is still followed in the [facility]- note 6/13 reviewed (at that time on Cres10 on M&F, 5mg  on W) but she subseq cut herself down to 1/2 on M&F due to nausea & aching. ~  FLP 2/14 on diet alone showed TChol 194, TG 143, HDL 55, LDL 110... We reviewed diet, exercise, etc...  GERD (ICD-530.81) - on OMEPRAZOLE 40mg /d... last EGD by DrPatterson 8/07 revealed sm HH, & gastritis and dilatation done... (RUT=neg, PPI Rx)... LEVSIN 0.125mg  helps her esoph spasm... Prn Phenergan for nausea.  DIVERTICULOSIS, COLON (ICD-562.10) & IRRITABLE BOWEL SYNDROME (ICD-564.1) ~  colonoscopy 2/03 by DrPatterson showed divertics and 5 mm polyp... ~  colonoscopy 3/11 showed divertics & cecal polyp= tubular adenoma, w/ f/u planned 61yrs. ~  9/13: seen by DrPatterson f/u for ?diverticulitis- improved after Cipro/Flagyl but CTAbd was neg x diverticulosis; he rec high fiber diet & Citrucel...  ? GALLSTONES>>  Diagnosed by DrPatterson and referred to [facility] DrWilson, pt asked to call prn fopr incr symptoms to consider surg... ~  Abd Ultrasound 10/13 by Walker Kehr for eval CP showed> norm GB, liver, kidneys, & Ao- neg sonar.  BREAST CANCER >> Abn mammogram 10/12 showing a left breast nodule & subseq surg (partial mastectomy & sentinel node bx) by [name] 12/12 that proved to be an invasive ductal carcinoma, 2.3cm size, w/ neg lymphovasc invasion, clear margins (but close at 1mm), & neg nodes; she had XRT by DrKindard (had her 11th treatment  today); and Oncology eval by DrMagrinat who plans hormone Rx later (no chemo she says)...  ~  2/13: She is tolerating treatment well & spirits are up, [name] had  to aspirate a seroma & it is now resolved... ~  She is followed by DrMagrinat, Streck, Kinard- now on [facility] 2.5mg /d...  FIBROMYALGIA (ICD-729.1) - she c/o chr fatigue, aching/ sore, on Tramadol & Tylenol, but most benefit from low dose Hydrocodone ~1/2 tab Prn... I have recommended an increase exercise program to her... ~  5/11:  Vit D level = 50 on 50000 u weekly by Rock Nephew, NP  HEADACHE (ICD-784.0) - eval at [facility] clinic in 2002, but she notes Rx didn't help... ~  3/11:  presented w/ muscle contraction HA's> Rx Tramadol 50mg  Q6H Prn.  DIZZINESS, CHRONIC (ICD-780.4) - MRI 2/09 per Walker Kehr showed atrophy, sm vessel dis, NAD...  Health Maintenance: ~  GI:  DrPatterson & up to date on colon screening. ~  GYN:  yearly f/u w/ Rock Nephew, NP for PAP, Mammogram at [facility], ?last BMD. ~  Immunizations:  yearly Flu vaccines in fall, Pneumovax in 2007?, TDAP given 5/11...   Past Surgical History  Procedure Date  . Coronary angioplasty with stent placement     Stent 2007  . Open heart surgery     01/19/2010  . Coronary artery bypass graft   . Eye surgery     bilateral cataract removal,  . Breast lumpectomy 02/06/11    left    Outpatient Encounter Prescriptions as of 03/31/2012  Medication Sig Dispense Refill  . aspirin 81 MG tablet Take 81 mg by mouth daily.        . Calcium Carbonate-Vitamin D (CALTRATE 600+D) 600-400 MG-UNIT per chew tablet Chew 1 tablet by mouth daily.      . Coenzyme Q10 (COQ10) 100 MG CAPS Take 1 capsule by mouth daily.        . fexofenadine (ALLEGRA) 180 MG tablet Take 180 mg by mouth daily.        . Glucosamine-Chondroit-Vit C-Mn (GLUCOSAMINE CHONDR 1500 COMPLX PO) Take 1 tablet by mouth 3 (three) times daily.        . hyoscyamine (ANASPAZ) 0.125 MG TBDP Place 1 tablet (0.125 mg total) under the tongue every 6 (six) hours as needed.  40 tablet  2  . isometheptene-acetaminophen-dichloralphenazone (MIDRIN) 65-325-100 MG capsule Take 1 capsule by mouth  every 4 (four) hours as needed for migraine.  30 capsule  0  . isosorbide mononitrate (IMDUR) 30 MG 24 hr tablet Take 1 tablet (30 mg total) by mouth daily.  90 tablet  3  . letrozole (FEMARA) 2.5 MG tablet Take 1 tablet (2.5 mg total) by mouth daily.  30 tablet  5  . losartan-hydrochlorothiazide (HYZAAR) 100-12.5 MG per tablet take 1 tablet by mouth once daily  30 tablet  12  . metoprolol (LOPRESSOR) 50 MG tablet Take 1 tablet (50 mg total) by mouth 2 (two) times daily.  60 tablet  5  . mometasone (NASONEX) 50 MCG/ACT nasal spray 2 sprays by Nasal route daily.        . nitroGLYCERIN (NITROSTAT) 0.4 MG SL tablet Take one tablet  By mouth SL every 5 minutes for a total of 3 doses as needed for chest pain.  25 tablet  3  . Olopatadine HCl (PATANASE) 0.6 % SOLN Place 2 puffs into the nose 2 (two) times daily.      . Omega-3 Fatty Acids (FISH OIL) 1200 MG CAPS 2 tabs  po bid       . omeprazole (PRILOSEC) 40 MG capsule take 1 capsule by mouth once daily 30 MINUTES BEFORE THE FIRST MEAL OF THE DAY  30 capsule  6  . traMADol (ULTRAM) 50 MG tablet Take 50 mg by mouth as needed. For pain      . travoprost, benzalkonium, (TRAVATAN) 0.004 % ophthalmic solution Place 1 drop into both eyes at bedtime.       . vitamin B-12 (CYANOCOBALAMIN) 1000 MCG tablet Take 1,000 mcg by mouth daily.        . Vitamin D, Ergocalciferol, (DRISDOL) 50000 UNITS CAPS Take 50,000 Units by mouth. Every other week        Allergies  Allergen Reactions  . Choline Fenofibrate Other (See Comments)    REACTION: pt states INTOL to Trilipix w/ "thigh burning"  . Simvastatin Other (See Comments)    REACTION: pt states INTOL to STATINS \\T \ refuses to restart  . Hydrocodone     Nightmare after taking cough syrup w/hydrocodone  . Adhesive (Tape) Rash  . Ceclor (Cefaclor) Rash  . Clarithromycin Rash  . Codeine Nausea Only  . Doxycycline Rash  . Lisinopril Cough    Developed ACE cough...  . Penicillins Itching and Rash    At injection  site  . Tobramycin-Dexamethasone Rash    Current Medications, Allergies, Past Medical History, Past Surgical History, Family History, and Social History were reviewed in [employer] record.    Review of Systems    See HPI - all other systems neg except as noted...  The patient complains of chest pain and headaches.  The patient denies anorexia, fever, weight loss, weight gain, vision loss, decreased hearing, hoarseness, syncope, dyspnea on exertion, peripheral edema, prolonged cough, hemoptysis, abdominal pain, melena, hematochezia, severe indigestion/heartburn, hematuria, incontinence, muscle weakness, suspicious skin lesions, transient blindness, difficulty walking, depression, unusual weight change, abnormal bleeding, enlarged lymph nodes, and angioedema.   Objective:   Physical Exam    WD, WN, 77 y/o WF in NAD... GENERAL:  Alert & oriented; pleasant & cooperative... HEENT:  [place]/AT, EOM-wnl, PERRLA, EACs-clear, TMs-wnl, NOSE-clear, THROAT-clear & wnl. NECK:  Supple w/ fair ROM; no JVD; normal carotid impulses w/o bruits; no thyromegaly or nodules palpated; no lymphadenopathy. CHEST:  Clear to P & A; without wheezes/ rales/ or rhonchi heard... chest wall is tender & reproduces her pain... HEART:  Regular Rhythm; without murmurs/ rubs/ or gallops detected... ABDOMEN:  Soft & nontender; normal bowel sounds; no organomegaly or masses palpated... EXT: without deformities, mild arthritic changes and +trigger points, no varicose veins/ venous insuffic/ or edema. NEURO:  CN's intact; motor testing normal; sensory testing normal; gait normal & balance OK. DERM:  No lesions noted; no rash etc...  RADIOLOGY DATA:  Reviewed in the EPIC EMR & discussed w/ the patient...  LABORATORY DATA:  Reviewed in the EPIC EMR & discussed w/ the patient...   Assessment & Plan:    BREAST CANCER>  SEE ABOVE, on FEMARA now> treated by [name], DrMagrinat, DrKinard & records  reviewed...  Asthma/ AR>  Stable on allergy shots & XOPENEX as needed; no recent exac...  HBP>  Controlled on Toprol & Hyzaar, continue same...  CAD>  Cath w/ patent grafts; had neg Nuclear study; continue same meds and f/u DrNishan...  CHOL>  Followed in the Lipid Clinic & intol to all meds- FLP looks better [id number] on diet alone...  GI> GERD, Divertics, ?Gallstones>  Followed by [employer] & stones eval by DrWilson [facility];  odd that f/u sonar 9/13 did not show stones...  FM/ HAs/ etc>  On Ultram, Tylenol, etc...  Other medical issues as noted> on CoQ10, Glucosamine, Vit B12, Vit D, etc...   Patient's Medications  New Prescriptions   No medications on file  Previous Medications   ASPIRIN 81 MG TABLET    Take 81 mg by mouth daily.     CALCIUM CARBONATE-VITAMIN D (CALTRATE 600+D) 600-400 MG-UNIT PER CHEW TABLET    Chew 1 tablet by mouth daily.   COENZYME Q10 (COQ10) 100 MG CAPS    Take 1 capsule by mouth daily.     FEXOFENADINE (ALLEGRA) 180 MG TABLET    Take 180 mg by mouth daily.     GLUCOSAMINE-CHONDROIT-VIT C-MN (GLUCOSAMINE CHONDR 1500 COMPLX PO)    Take 1 tablet by mouth 3 (three) times daily.     HYOSCYAMINE (ANASPAZ) 0.125 MG TBDP    Place 1 tablet (0.125 mg total) under the tongue every 6 (six) hours as needed.   ISOSORBIDE MONONITRATE (IMDUR) 30 MG 24 HR TABLET    Take 1 tablet (30 mg total) by mouth daily.   LETROZOLE (FEMARA) 2.5 MG TABLET    Take 1 tablet (2.5 mg total) by mouth daily.   LOSARTAN-HYDROCHLOROTHIAZIDE (HYZAAR) 100-12.5 MG PER TABLET    take 1 tablet by mouth once daily   METOPROLOL (LOPRESSOR) 50 MG TABLET    Take 1 tablet (50 mg total) by mouth 2 (two) times daily.   MOMETASONE (NASONEX) 50 MCG/ACT NASAL SPRAY    2 sprays by Nasal route daily.     NITROGLYCERIN (NITROSTAT) 0.4 MG SL TABLET    Take one tablet  By mouth SL every 5 minutes for a total of 3 doses as needed for chest pain.   OLOPATADINE HCL (PATANASE) 0.6 % SOLN    Place 2 puffs into the nose 2  (two) times daily.   OMEGA-3 FATTY ACIDS (FISH OIL) 1200 MG CAPS    2 tabs po bid    TRAMADOL (ULTRAM) 50 MG TABLET    Take 50 mg by mouth as needed. For pain   TRAVOPROST, BENZALKONIUM, (TRAVATAN) 0.004 % OPHTHALMIC SOLUTION    Place 1 drop into both eyes at bedtime.    VITAMIN B-12 (CYANOCOBALAMIN) 1000 MCG TABLET    Take 1,000 mcg by mouth daily.     VITAMIN D, ERGOCALCIFEROL, (DRISDOL) 50000 UNITS CAPS    Take 50,000 Units by mouth. Every other week  Modified Medications   Modified Medication Previous Medication   ISOMETHEPTENE-ACETAMINOPHEN-DICHLORALPHENAZONE (MIDRIN) 65-325-100 MG CAPSULE isometheptene-acetaminophen-dichloralphenazone (MIDRIN) 65-325-100 MG capsule      Take 1 capsule by mouth every 6 (six) hours as needed for migraine.    Take 1 capsule by mouth every 4 (four) hours as needed for migraine.   OMEPRAZOLE (PRILOSEC) 40 MG CAPSULE omeprazole (PRILOSEC) 40 MG capsule      take 1 capsule by mouth once daily 30 MINUTES BEFORE THE FIRST MEAL OF THE DAY    take 1 capsule by mouth once daily 30 MINUTES BEFORE THE FIRST MEAL OF THE DAY  Discontinued Medications   No medications on file

## 2012-03-31 NOTE — Patient Instructions (Addendum)
Today we updated your med list in our EPIC system...    Continue your current medications the same...  Please return to our lab one morning this week for your FASTING blood work...     We will contact you w/ the results when avail...  We refilled your prescription for [facility] for your headaches...  Call for any questions or if we can be of service in any way...  Let's continue our 6 month follow up visits.Marland KitchenMarland Kitchen

## 2012-04-01 DIAGNOSIS — M5030 Other cervical disc degeneration, unspecified cervical region: Secondary | ICD-10-CM | POA: Diagnosis not present

## 2012-04-01 DIAGNOSIS — M9981 Other biomechanical lesions of cervical region: Secondary | ICD-10-CM | POA: Diagnosis not present

## 2012-04-01 DIAGNOSIS — M5412 Radiculopathy, cervical region: Secondary | ICD-10-CM | POA: Diagnosis not present

## 2012-04-06 ENCOUNTER — Other Ambulatory Visit: Payer: Self-pay | Admitting: Gastroenterology

## 2012-04-06 ENCOUNTER — Other Ambulatory Visit (INDEPENDENT_AMBULATORY_CARE_PROVIDER_SITE_OTHER): Payer: Medicare Other

## 2012-04-06 DIAGNOSIS — E785 Hyperlipidemia, unspecified: Secondary | ICD-10-CM

## 2012-04-06 DIAGNOSIS — F419 Anxiety disorder, unspecified: Secondary | ICD-10-CM

## 2012-04-06 DIAGNOSIS — J309 Allergic rhinitis, unspecified: Secondary | ICD-10-CM | POA: Diagnosis not present

## 2012-04-06 DIAGNOSIS — F411 Generalized anxiety disorder: Secondary | ICD-10-CM

## 2012-04-06 LAB — LIPID PANEL
Cholesterol: 194 mg/dL (ref 0–200)
LDL Cholesterol: 110 mg/dL — ABNORMAL HIGH (ref 0–99)

## 2012-04-06 LAB — TSH: TSH: 1.33 u[IU]/mL (ref 0.35–5.50)

## 2012-04-14 DIAGNOSIS — M5030 Other cervical disc degeneration, unspecified cervical region: Secondary | ICD-10-CM | POA: Diagnosis not present

## 2012-04-14 DIAGNOSIS — M9981 Other biomechanical lesions of cervical region: Secondary | ICD-10-CM | POA: Diagnosis not present

## 2012-04-14 DIAGNOSIS — M5412 Radiculopathy, cervical region: Secondary | ICD-10-CM | POA: Diagnosis not present

## 2012-04-16 DIAGNOSIS — J309 Allergic rhinitis, unspecified: Secondary | ICD-10-CM | POA: Diagnosis not present

## 2012-04-29 DIAGNOSIS — M5030 Other cervical disc degeneration, unspecified cervical region: Secondary | ICD-10-CM | POA: Diagnosis not present

## 2012-04-29 DIAGNOSIS — M9981 Other biomechanical lesions of cervical region: Secondary | ICD-10-CM | POA: Diagnosis not present

## 2012-04-29 DIAGNOSIS — M999 Biomechanical lesion, unspecified: Secondary | ICD-10-CM | POA: Diagnosis not present

## 2012-04-29 DIAGNOSIS — M5412 Radiculopathy, cervical region: Secondary | ICD-10-CM | POA: Diagnosis not present

## 2012-04-30 DIAGNOSIS — J309 Allergic rhinitis, unspecified: Secondary | ICD-10-CM | POA: Diagnosis not present

## 2012-05-07 DIAGNOSIS — J309 Allergic rhinitis, unspecified: Secondary | ICD-10-CM | POA: Diagnosis not present

## 2012-05-15 DIAGNOSIS — M545 Low back pain: Secondary | ICD-10-CM | POA: Diagnosis not present

## 2012-05-15 DIAGNOSIS — M5137 Other intervertebral disc degeneration, lumbosacral region: Secondary | ICD-10-CM | POA: Diagnosis not present

## 2012-05-15 DIAGNOSIS — M999 Biomechanical lesion, unspecified: Secondary | ICD-10-CM | POA: Diagnosis not present

## 2012-05-19 DIAGNOSIS — M545 Low back pain: Secondary | ICD-10-CM | POA: Diagnosis not present

## 2012-05-19 DIAGNOSIS — M9981 Other biomechanical lesions of cervical region: Secondary | ICD-10-CM | POA: Diagnosis not present

## 2012-05-19 DIAGNOSIS — M5137 Other intervertebral disc degeneration, lumbosacral region: Secondary | ICD-10-CM | POA: Diagnosis not present

## 2012-05-19 DIAGNOSIS — M999 Biomechanical lesion, unspecified: Secondary | ICD-10-CM | POA: Diagnosis not present

## 2012-05-20 DIAGNOSIS — J309 Allergic rhinitis, unspecified: Secondary | ICD-10-CM | POA: Diagnosis not present

## 2012-05-29 DIAGNOSIS — J309 Allergic rhinitis, unspecified: Secondary | ICD-10-CM | POA: Diagnosis not present

## 2012-06-02 DIAGNOSIS — M545 Low back pain: Secondary | ICD-10-CM | POA: Diagnosis not present

## 2012-06-02 DIAGNOSIS — M5137 Other intervertebral disc degeneration, lumbosacral region: Secondary | ICD-10-CM | POA: Diagnosis not present

## 2012-06-02 DIAGNOSIS — M999 Biomechanical lesion, unspecified: Secondary | ICD-10-CM | POA: Diagnosis not present

## 2012-06-02 DIAGNOSIS — J309 Allergic rhinitis, unspecified: Secondary | ICD-10-CM | POA: Diagnosis not present

## 2012-06-09 DIAGNOSIS — J309 Allergic rhinitis, unspecified: Secondary | ICD-10-CM | POA: Diagnosis not present

## 2012-06-16 ENCOUNTER — Emergency Department (HOSPITAL_COMMUNITY): Payer: Medicare Other

## 2012-06-16 ENCOUNTER — Observation Stay (HOSPITAL_COMMUNITY): Payer: Medicare Other

## 2012-06-16 ENCOUNTER — Telehealth: Payer: Self-pay | Admitting: Pulmonary Disease

## 2012-06-16 ENCOUNTER — Observation Stay (HOSPITAL_COMMUNITY)
Admission: EM | Admit: 2012-06-16 | Discharge: 2012-06-17 | Disposition: A | Discharge status: Home / Self Care | Payer: Medicare Other | Attending: Internal Medicine | Admitting: Internal Medicine

## 2012-06-16 ENCOUNTER — Encounter (HOSPITAL_COMMUNITY): Payer: Self-pay | Admitting: *Deleted

## 2012-06-16 DIAGNOSIS — M79609 Pain in unspecified limb: Secondary | ICD-10-CM | POA: Insufficient documentation

## 2012-06-16 DIAGNOSIS — R0609 Other forms of dyspnea: Secondary | ICD-10-CM | POA: Diagnosis not present

## 2012-06-16 DIAGNOSIS — E785 Hyperlipidemia, unspecified: Secondary | ICD-10-CM

## 2012-06-16 DIAGNOSIS — K2990 Gastroduodenitis, unspecified, without bleeding: Secondary | ICD-10-CM

## 2012-06-16 DIAGNOSIS — J45909 Unspecified asthma, uncomplicated: Secondary | ICD-10-CM | POA: Diagnosis not present

## 2012-06-16 DIAGNOSIS — J309 Allergic rhinitis, unspecified: Secondary | ICD-10-CM | POA: Diagnosis present

## 2012-06-16 DIAGNOSIS — IMO0002 Reserved for concepts with insufficient information to code with codable children: Secondary | ICD-10-CM | POA: Diagnosis not present

## 2012-06-16 DIAGNOSIS — IMO0001 Reserved for inherently not codable concepts without codable children: Secondary | ICD-10-CM

## 2012-06-16 DIAGNOSIS — T17908D Unspecified foreign body in respiratory tract, part unspecified causing other injury, subsequent encounter: Secondary | ICD-10-CM

## 2012-06-16 DIAGNOSIS — K7689 Other specified diseases of liver: Secondary | ICD-10-CM

## 2012-06-16 DIAGNOSIS — Z79899 Other long term (current) drug therapy: Secondary | ICD-10-CM | POA: Insufficient documentation

## 2012-06-16 DIAGNOSIS — J45901 Unspecified asthma with (acute) exacerbation: Secondary | ICD-10-CM | POA: Insufficient documentation

## 2012-06-16 DIAGNOSIS — T17900A Unspecified foreign body in respiratory tract, part unspecified causing asphyxiation, initial encounter: Secondary | ICD-10-CM

## 2012-06-16 DIAGNOSIS — R05 Cough: Secondary | ICD-10-CM | POA: Insufficient documentation

## 2012-06-16 DIAGNOSIS — R51 Headache: Secondary | ICD-10-CM

## 2012-06-16 DIAGNOSIS — R059 Cough, unspecified: Secondary | ICD-10-CM | POA: Insufficient documentation

## 2012-06-16 DIAGNOSIS — N3000 Acute cystitis without hematuria: Secondary | ICD-10-CM

## 2012-06-16 DIAGNOSIS — R0989 Other specified symptoms and signs involving the circulatory and respiratory systems: Secondary | ICD-10-CM | POA: Diagnosis not present

## 2012-06-16 DIAGNOSIS — I2510 Atherosclerotic heart disease of native coronary artery without angina pectoris: Secondary | ICD-10-CM

## 2012-06-16 DIAGNOSIS — Z853 Personal history of malignant neoplasm of breast: Secondary | ICD-10-CM | POA: Insufficient documentation

## 2012-06-16 DIAGNOSIS — J449 Chronic obstructive pulmonary disease, unspecified: Secondary | ICD-10-CM

## 2012-06-16 DIAGNOSIS — R6889 Other general symptoms and signs: Secondary | ICD-10-CM | POA: Diagnosis not present

## 2012-06-16 DIAGNOSIS — M4802 Spinal stenosis, cervical region: Secondary | ICD-10-CM | POA: Diagnosis not present

## 2012-06-16 DIAGNOSIS — Z860100 Personal history of colon polyps, unspecified: Secondary | ICD-10-CM

## 2012-06-16 DIAGNOSIS — R071 Chest pain on breathing: Secondary | ICD-10-CM

## 2012-06-16 DIAGNOSIS — K589 Irritable bowel syndrome without diarrhea: Secondary | ICD-10-CM

## 2012-06-16 DIAGNOSIS — R42 Dizziness and giddiness: Secondary | ICD-10-CM

## 2012-06-16 DIAGNOSIS — T17308A Unspecified foreign body in larynx causing other injury, initial encounter: Secondary | ICD-10-CM

## 2012-06-16 DIAGNOSIS — Z951 Presence of aortocoronary bypass graft: Secondary | ICD-10-CM | POA: Insufficient documentation

## 2012-06-16 DIAGNOSIS — K5730 Diverticulosis of large intestine without perforation or abscess without bleeding: Secondary | ICD-10-CM

## 2012-06-16 DIAGNOSIS — T17900D Unspecified foreign body in respiratory tract, part unspecified causing asphyxiation, subsequent encounter: Secondary | ICD-10-CM

## 2012-06-16 DIAGNOSIS — J984 Other disorders of lung: Secondary | ICD-10-CM | POA: Diagnosis not present

## 2012-06-16 DIAGNOSIS — M47812 Spondylosis without myelopathy or radiculopathy, cervical region: Secondary | ICD-10-CM | POA: Diagnosis not present

## 2012-06-16 DIAGNOSIS — I10 Essential (primary) hypertension: Secondary | ICD-10-CM | POA: Diagnosis not present

## 2012-06-16 DIAGNOSIS — J441 Chronic obstructive pulmonary disease with (acute) exacerbation: Secondary | ICD-10-CM | POA: Diagnosis not present

## 2012-06-16 DIAGNOSIS — K2970 Gastritis, unspecified, without bleeding: Secondary | ICD-10-CM

## 2012-06-16 DIAGNOSIS — K219 Gastro-esophageal reflux disease without esophagitis: Secondary | ICD-10-CM | POA: Diagnosis present

## 2012-06-16 DIAGNOSIS — K449 Diaphragmatic hernia without obstruction or gangrene: Secondary | ICD-10-CM

## 2012-06-16 DIAGNOSIS — T17908A Unspecified foreign body in respiratory tract, part unspecified causing other injury, initial encounter: Secondary | ICD-10-CM

## 2012-06-16 DIAGNOSIS — R0789 Other chest pain: Secondary | ICD-10-CM

## 2012-06-16 DIAGNOSIS — K8020 Calculus of gallbladder without cholecystitis without obstruction: Secondary | ICD-10-CM

## 2012-06-16 DIAGNOSIS — R519 Headache, unspecified: Secondary | ICD-10-CM | POA: Diagnosis present

## 2012-06-16 DIAGNOSIS — H409 Unspecified glaucoma: Secondary | ICD-10-CM

## 2012-06-16 DIAGNOSIS — R21 Rash and other nonspecific skin eruption: Secondary | ICD-10-CM

## 2012-06-16 LAB — COMPREHENSIVE METABOLIC PANEL
Albumin: 3.9 g/dL (ref 3.5–5.2)
Alkaline Phosphatase: 78 U/L (ref 39–117)
BUN: 11 mg/dL (ref 6–23)
Calcium: 9.6 mg/dL (ref 8.4–10.5)
Creatinine, Ser: 0.49 mg/dL — ABNORMAL LOW (ref 0.50–1.10)
GFR calc Af Amer: >90 mL/min (ref >90)
Potassium: 4.3 mEq/L (ref 3.5–5.1)
Total Protein: 7.4 g/dL (ref 6.0–8.3)

## 2012-06-16 LAB — CBC
HCT: 38.1 % (ref 36.0–46.0)
Hemoglobin: 13.8 g/dL (ref 12.0–15.0)
MCHC: 36.2 g/dL — ABNORMAL HIGH (ref 30.0–36.0)

## 2012-06-16 MED ORDER — LOSARTAN POTASSIUM 50 MG PO TABS
100.0000 mg | ORAL_TABLET | Freq: Every day | ORAL | Status: DC
  Filled 2012-06-16: qty 2

## 2012-06-16 MED ORDER — OXYCODONE HCL 5 MG PO TABS: 5.0000 mg | ORAL_TABLET | ORAL | Status: DC | PRN

## 2012-06-16 MED ORDER — SODIUM CHLORIDE 0.9 % IV SOLN
INTRAVENOUS | Status: DC
  Administered 2012-06-16: 16:00:00 via INTRAVENOUS

## 2012-06-16 MED ORDER — ISOMETHEPTENE-APAP-DICHLORAL 65-325-100 MG PO CAPS: 1.0000 | ORAL_CAPSULE | Freq: Four times a day (QID) | ORAL | Status: DC | PRN

## 2012-06-16 MED ORDER — HEPARIN SODIUM (PORCINE) 5000 UNIT/ML IJ SOLN
5000.0000 [IU] | Freq: Three times a day (TID) | INTRAMUSCULAR | Status: DC
  Administered 2012-06-16 – 2012-06-17 (×2): 5000 [IU] via SUBCUTANEOUS
  Filled 2012-06-16 (×5): qty 1

## 2012-06-16 MED ORDER — LOSARTAN POTASSIUM-HCTZ 100-12.5 MG PO TABS: 1.0000 | ORAL_TABLET | Freq: Every day | ORAL | Status: DC

## 2012-06-16 MED ORDER — LORATADINE 10 MG PO TABS
10.0000 mg | ORAL_TABLET | Freq: Every day | ORAL | Status: DC
  Administered 2012-06-17: 10 mg via ORAL
  Filled 2012-06-16: qty 1

## 2012-06-16 MED ORDER — TRAMADOL HCL 50 MG PO TABS: 50.0000 mg | ORAL_TABLET | ORAL | Status: DC | PRN

## 2012-06-16 MED ORDER — METOPROLOL TARTRATE 50 MG PO TABS
50.0000 mg | ORAL_TABLET | Freq: Two times a day (BID) | ORAL | Status: DC
  Administered 2012-06-16 – 2012-06-17 (×2): 50 mg via ORAL
  Filled 2012-06-16 (×3): qty 1

## 2012-06-16 MED ORDER — ASPIRIN 81 MG PO CHEW
81.0000 mg | CHEWABLE_TABLET | Freq: Every day | ORAL | Status: DC
  Administered 2012-06-17: 81 mg via ORAL
  Filled 2012-06-16: qty 1

## 2012-06-16 MED ORDER — ACETAMINOPHEN 325 MG PO TABS
650.0000 mg | ORAL_TABLET | Freq: Four times a day (QID) | ORAL | Status: DC | PRN
  Administered 2012-06-17: 650 mg via ORAL
  Filled 2012-06-16: qty 2

## 2012-06-16 MED ORDER — LETROZOLE 2.5 MG PO TABS
2.5000 mg | ORAL_TABLET | Freq: Every day | ORAL | Status: DC
  Administered 2012-06-16: 2.5 mg via ORAL
  Filled 2012-06-16 (×2): qty 1

## 2012-06-16 MED ORDER — METHYLPREDNISOLONE SODIUM SUCC 40 MG IJ SOLR
40.0000 mg | Freq: Two times a day (BID) | INTRAMUSCULAR | Status: DC
  Administered 2012-06-16 – 2012-06-17 (×2): 40 mg via INTRAVENOUS
  Filled 2012-06-16 (×4): qty 1

## 2012-06-16 MED ORDER — ACETAMINOPHEN 325 MG PO TABS
650.0000 mg | ORAL_TABLET | Freq: Once | ORAL | Status: AC
  Administered 2012-06-16: 650 mg via ORAL
  Filled 2012-06-16: qty 2

## 2012-06-16 MED ORDER — VITAMIN B-12 1000 MCG PO TABS
1000.0000 ug | ORAL_TABLET | Freq: Every day | ORAL | Status: DC
  Administered 2012-06-17: 1000 ug via ORAL
  Filled 2012-06-16: qty 1

## 2012-06-16 MED ORDER — CALCIUM CARBONATE-VITAMIN D 500-200 MG-UNIT PO TABS
1.0000 | ORAL_TABLET | Freq: Every day | ORAL | Status: DC
Start: 2012-06-17 — End: 2012-06-17
  Administered 2012-06-17: 1 via ORAL
  Filled 2012-06-16: qty 1

## 2012-06-16 MED ORDER — ONDANSETRON HCL 4 MG/2ML IJ SOLN: 4.0000 mg | Freq: Four times a day (QID) | INTRAMUSCULAR | Status: DC | PRN

## 2012-06-16 MED ORDER — ALBUTEROL SULFATE (5 MG/ML) 0.5% IN NEBU
5.0000 mg | INHALATION_SOLUTION | Freq: Once | RESPIRATORY_TRACT | Status: AC
Start: 2012-06-16 — End: 2012-06-16
  Administered 2012-06-16: 5 mg via RESPIRATORY_TRACT
  Filled 2012-06-16: qty 1

## 2012-06-16 MED ORDER — NITROGLYCERIN 0.3 MG SL SUBL: 0.3000 mg | SUBLINGUAL_TABLET | SUBLINGUAL | Status: DC | PRN

## 2012-06-16 MED ORDER — ALBUTEROL SULFATE (5 MG/ML) 0.5% IN NEBU
2.5000 mg | INHALATION_SOLUTION | Freq: Four times a day (QID) | RESPIRATORY_TRACT | Status: DC
  Administered 2012-06-16 – 2012-06-17 (×3): 2.5 mg via RESPIRATORY_TRACT
  Filled 2012-06-16 (×5): qty 0.5

## 2012-06-16 MED ORDER — CALCIUM CARBONATE-VITAMIN D 600-400 MG-UNIT PO CHEW: 1.0000 | CHEWABLE_TABLET | Freq: Every day | ORAL | Status: DC

## 2012-06-16 MED ORDER — HYDROCODONE-ACETAMINOPHEN 5-325 MG PO TABS: 1.0000 | ORAL_TABLET | Freq: Once | ORAL | Status: DC

## 2012-06-16 MED ORDER — ONDANSETRON HCL 4 MG PO TABS: 4.0000 mg | ORAL_TABLET | Freq: Four times a day (QID) | ORAL | Status: DC | PRN

## 2012-06-16 MED ORDER — TRAVOPROST (BAK FREE) 0.004 % OP SOLN
1.0000 [drp] | Freq: Every day | OPHTHALMIC | Status: DC
  Administered 2012-06-16: 1 [drp] via OPHTHALMIC
  Filled 2012-06-16: qty 2.5

## 2012-06-16 MED ORDER — PANTOPRAZOLE SODIUM 40 MG IV SOLR
40.0000 mg | Freq: Two times a day (BID) | INTRAVENOUS | Status: DC
  Administered 2012-06-16 – 2012-06-17 (×2): 40 mg via INTRAVENOUS
  Filled 2012-06-16 (×4): qty 40

## 2012-06-16 MED ORDER — IPRATROPIUM BROMIDE 0.02 % IN SOLN
0.5000 mg | Freq: Once | RESPIRATORY_TRACT | Status: AC
  Administered 2012-06-16: 0.5 mg via RESPIRATORY_TRACT
  Filled 2012-06-16: qty 2.5

## 2012-06-16 MED ORDER — HYDROCHLOROTHIAZIDE 12.5 MG PO CAPS
12.5000 mg | ORAL_CAPSULE | Freq: Every day | ORAL | Status: DC
  Filled 2012-06-16: qty 1

## 2012-06-16 MED ORDER — ACETAMINOPHEN 650 MG RE SUPP: 650.0000 mg | Freq: Four times a day (QID) | RECTAL | Status: DC | PRN

## 2012-06-16 MED ORDER — LEVOFLOXACIN IN D5W 750 MG/150ML IV SOLN
750.0000 mg | INTRAVENOUS | Status: DC
  Filled 2012-06-16: qty 150

## 2012-06-16 MED ORDER — FLUTICASONE PROPIONATE 50 MCG/ACT NA SUSP
2.0000 | Freq: Every day | NASAL | Status: DC
  Administered 2012-06-16 – 2012-06-17 (×2): 2 via NASAL
  Filled 2012-06-16: qty 16

## 2012-06-16 MED ORDER — ISOSORBIDE MONONITRATE ER 30 MG PO TB24
30.0000 mg | ORAL_TABLET | Freq: Every day | ORAL | Status: DC
  Administered 2012-06-17: 30 mg via ORAL
  Filled 2012-06-16: qty 1

## 2012-06-16 MED ORDER — TRAMADOL HCL 50 MG PO TABS
50.0000 mg | ORAL_TABLET | Freq: Four times a day (QID) | ORAL | Status: DC | PRN
  Administered 2012-06-16: 50 mg via ORAL
  Filled 2012-06-16: qty 1

## 2012-06-16 MED ORDER — MORPHINE SULFATE 2 MG/ML IJ SOLN: 1.0000 mg | INTRAMUSCULAR | Status: DC | PRN

## 2012-06-16 MED ORDER — TRAMADOL HCL 50 MG PO TABS
50.0000 mg | ORAL_TABLET | Freq: Once | ORAL | Status: AC
  Administered 2012-06-16: 50 mg via ORAL
  Filled 2012-06-16: qty 1

## 2012-06-16 NOTE — Telephone Encounter (Signed)
Pt states that her wheezing has gotten worse & is going to ED.  [name]

## 2012-06-16 NOTE — ED Notes (Signed)
Patient transported to X-ray 

## 2012-06-16 NOTE — Telephone Encounter (Signed)
I spoke with pt. She is on her way to the ED. She stated when she took her medications this AM believes it went down the wrong way. She has a burning sensation in her chest, she is now wheezing (like never before per pt). She is using her inhaler but is not helping. I advised will forward to SN as an FYI she is going to [employer]. Nothing further was needed,

## 2012-06-16 NOTE — H&P (Signed)
PULMONARY  / CRITICAL CARE MEDICINE  Name: DESHON KOSLOWSKI MRN: [id number] DOB: 12-08-1935    ADMISSION DATE:  06/16/2012 CONSULTATION DATE:  4/22  REFERRING MD :  Effie Shy  PRIMARY SERVICE:  PCCM   CHIEF COMPLAINT:  Dyspnea and cough   BRIEF PATIENT DESCRIPTION:  77 year old female f/b SN w/ sig h/o reactive airway disease and seasonal allergies. Presents to ER on 4/22 w/ CC: wheezing and SOB after possible pill aspiration.   SIGNIFICANT EVENTS / STUDIES:    LINES / TUBES:   CULTURES:   ANTIBIOTICS: levaquin 4/22>>>  HISTORY OF PRESENT ILLNESS:   This is a delightful 77 year old female f/b Dr [name]  for asthma and seasonal allergies,  and maintained on rescue SABA (stopped symbicort back in 2012). Presented to the ER on 4/22 w/ CC: cough and shortness of breath after choking on am medications.  . She has had some increase in her cough over the last 2 weeks which she associated with increased post-nasal drainage and seasonal allergies. Since the episode of choking she has had a wet cough, notes initially was productive of orange discolored sputum which she felt may have been associated with her medications. Since this time she has noted cough essentially non-productive but has the sensation that she can't "bring-up" her sputum, and has associated acid taste in mouth, increased wheezing, some shortness of breath as well as worsening of her headache which in review of her records has been an on-going issue in the out-pt setting.  She will be admitted for treatment of asthmatic exacerbation and possible aspiration.   At baseline Sleeping ok without nocturnal  or early am exacerbation  of respiratory  c/o's or need for noct saba. Also denies any obvious fluctuation of symptoms with weather or environmental changes or other aggravating or alleviating factors except as outlined above   No obvious daytime variabilty or assoc  cp  or overt sinus or hb symptoms. No unusual exp hx or h/o  childhood pna/ asthma or premature birth to her knowledge.   PAST MEDICAL HISTORY :  Past Medical History  Diagnosis Date  . Unspecified glaucoma(365.9)   . Allergic rhinitis, cause unspecified   . Unspecified asthma   . Unspecified essential hypertension   . Other and unspecified hyperlipidemia   . Gastritis   . Abdominal pain, left lower quadrant   . Diverticulosis of colon (without mention of hemorrhage)   . Irritable bowel syndrome   . Colonic polyp 04-27-2009    tubular adenoma  . Headache   . Dizziness   . Breast cancer, Left 12/20/2010  . Gallstones   . CAD (coronary artery disease)     a. 01/19/2010 s/p CABG x 3, lima->lad, vg->diag, vg->om1;  b. 06/2011 :Lexi MV: EF 84%, No ischemia.  . Bronchitis     hx of  . Pneumonia   . Hiatal hernia   . Fibromyalgia   . Esophageal reflux     hiatal hernia   Past Surgical History  Procedure Laterality Date  . Coronary angioplasty with stent placement      Stent 2007  . Open heart surgery      01/19/2010  . Coronary artery bypass graft    . Eye surgery      bilateral cataract removal,  . Breast lumpectomy  02/06/11    left   Prior to Admission medications   Medication Sig Start Date End Date Taking? Authorizing Provider  aspirin 81 MG tablet Take 81 mg  by mouth daily.     Yes Historical Provider, MD  Calcium Carbonate-Vitamin D (CALTRATE 600+D) 600-400 MG-UNIT per chew tablet Chew 1 tablet by mouth daily.   Yes Historical Provider, MD  Coenzyme Q10 (COQ10) 100 MG CAPS Take 1 capsule by mouth daily.     Yes Historical Provider, MD  fexofenadine (ALLEGRA) 180 MG tablet Take 180 mg by mouth daily.     Yes Historical Provider, MD  Glucosamine-Chondroit-Vit C-Mn (GLUCOSAMINE CHONDR 1500 COMPLX PO) Take 1 tablet by mouth 3 (three) times daily.     Yes Historical Provider, MD  hyoscyamine (ANASPAZ) 0.125 MG TBDP Place 1 tablet (0.125 mg total) under the tongue every 6 (six) hours as needed. 11/01/11  Yes Amy S Esterwood, PA-C   isometheptene-acetaminophen-dichloralphenazone (MIDRIN) 65-325-100 MG capsule Take 1 capsule by mouth every 6 (six) hours as needed for migraine. 03/31/12  Yes Michele Mcalpine, MD  isosorbide mononitrate (IMDUR) 30 MG 24 hr tablet Take 1 tablet (30 mg total) by mouth daily. 12/11/11  Yes Wendall Stade, MD  letrozole [facility]) 2.5 MG tablet Take 1 tablet (2.5 mg total) by mouth daily. 01/28/12  Yes Myrtis Ser, NP  losartan-hydrochlorothiazide (HYZAAR) 100-12.5 MG per tablet take 1 tablet by mouth once daily 06/21/11  Yes Wendall Stade, MD  metoprolol (LOPRESSOR) 50 MG tablet Take 1 tablet (50 mg total) by mouth 2 (two) times daily. 03/26/12  Yes Wendall Stade, MD  mometasone (NASONEX) 50 MCG/ACT nasal spray 2 sprays by Nasal route daily.     Yes Historical Provider, MD  Omega-3 Fatty Acids (FISH OIL) 1200 MG CAPS 2 tabs po bid    Yes Historical Provider, MD  omeprazole (PRILOSEC) 40 MG capsule take 1 capsule by mouth once daily 30 MINUTES BEFORE THE FIRST MEAL OF THE DAY 04/06/12  Yes Mardella Layman, MD  traMADol (ULTRAM) 50 MG tablet Take 50 mg by mouth as needed. For pain 11/28/10  Yes Wendall Stade, MD  travoprost, benzalkonium, (TRAVATAN) 0.004 % ophthalmic solution Place 1 drop into both eyes at bedtime.    Yes Historical Provider, MD  vitamin B-12 (CYANOCOBALAMIN) 1000 MCG tablet Take 1,000 mcg by mouth daily.     Yes Historical Provider, MD  Vitamin D, Ergocalciferol, (DRISDOL) 50000 UNITS CAPS Take 50,000 Units by mouth every 14 (fourteen) days. Every other week   Yes Historical Provider, MD  nitroGLYCERIN (NITROSTAT) 0.4 MG SL tablet Take one tablet  By mouth SL every 5 minutes for a total of 3 doses as needed for chest pain. 07/13/10   Gaylord Shih, MD   Allergies  Allergen Reactions  . Choline Fenofibrate Other (See Comments)    REACTION: pt states INTOL to Trilipix w/ "thigh burning"  . Simvastatin Other (See Comments)    REACTION: pt states INTOL to STATINS \\T \ refuses to restart   . Hydrocodone     Nightmare after taking cough syrup w/hydrocodone  . Adhesive (Tape) Rash  . Ceclor (Cefaclor) Rash  . Clarithromycin Rash  . Codeine Nausea Only  . Doxycycline Rash  . Lisinopril Cough    Developed ACE cough...  . Penicillins Itching and Rash    At injection site  . Tobramycin-Dexamethasone Rash    FAMILY HISTORY:  Family History  Problem Relation Age of Onset  . Heart disease Brother   . Cancer Brother     gland cancer  . Diabetes Sister   . Breast cancer Sister   . Stroke Mother   .  Stroke Father    SOCIAL HISTORY:  reports that she has never smoked. She has never used smokeless tobacco. She reports that she does not drink alcohol or use illicit drugs.  REVIEW OF SYSTEMS: bolds are positive:   Constitutional: Negative for fever, chills, weight loss, malaise/fatigue and diaphoresis.  HENT: Negative for hearing loss, ear pain, nosebleeds, congestion increased for last 2 weeks, w/ increased PND, sore throat, neck pain, tinnitus and ear discharge.   Eyes: Negative for blurred vision, double vision, photophobia, pain, discharge and redness.  Respiratory: Negative for cough, worse for last 2 weeks, now non-productive, wet sounding and associated w/ acid taste.hemoptysis, sputum production, shortness of breath, wheezing and stridor.   Cardiovascular: Negative for chest pain, palpitations, orthopnea, claudication, leg swelling and PND.  Gastrointestinal: Negative for heartburn, nausea, vomiting, abdominal pain, diarrhea, constipation, blood in stool and melena.  Genitourinary: Negative for dysuria, urgency, frequency, hematuria and flank pain.  Musculoskeletal: Negative for myalgias, back pain, joint pain and falls.  Skin: Negative for itching and rash.  Neurological: Negative for dizziness, tingling, tremors, sensory change, speech change, focal weakness, seizures, loss of consciousness, weakness and headaches.  Endo/Heme/Allergies: Negative for environmental  allergies and polydipsia. Does not bruise/bleed easily.  SUBJECTIVE:  Elderly wf nad in ER p neb able to lie back comfortably at 30 deg RA  VITAL SIGNS: Temp:  [97.6 F (36.4 C)-97.9 F (36.6 C)] 97.6 F (36.4 C) (04/22 1213) Pulse Rate:  [67-74] 74 (04/22 1213) Resp:  [18] 18 (04/22 0945) BP: (184-191)/(80-84) 191/84 mmHg (04/22 1213) SpO2:  [95 %-98 %] 98 % (04/22 1213)  PHYSICAL EXAMINATION: General:  No acute distress, well nourished 77 year old female  Neuro:  No focal def. Does c/o neck pain Posterior.  HEENT:  MM moist, + upper airway wheeze  Cardiovascular:  rrr Lungs:  Prolonged exhale, scattered rhonchi, exp wheeze  Abdomen:  Non-tender + bowel sounds  Musculoskeletal:  Intact  Skin:  Intact   No results found for this basename: NA, K, CL, CO2, BUN, CREATININE, GLUCOSE,  in the last 168 hours No results found for this basename: HGB, HCT, WBC, PLT,  in the last 168 hours Dg Chest 2 View  06/16/2012  *RADIOLOGY REPORT*  Clinical Data: Recent aspiration of ingested vitamin  CHEST - 2 VIEW  Comparison: 12/11/2011  Findings: Postsurgical changes are again noted.  The heart and pulmonary vascularity are within normal limits.  The lungs are clear.  No acute bony abnormality is noted.  Multiple cardiac stents are seen.  IMPRESSION: No acute abnormality is noted.   Original Report Authenticated By: Alcide Clever, M.D.    Principal Problem:   Foreign body aspiration Active Problems:   ALLERGIC RHINITIS   GERD   Headache   Chronic asthmatic bronchitis with acute exacerbation   ASSESSMENT / PLAN: 1) acute asthmatic exacerbation in the setting of possible foreign body aspiration (pill). Probably complicated by allergic rhinitis,  flare of seasonal allergies and increased gastric reflux. Her CXR is clear w/ no evidence of airway occlusion. It is possible that the pill has dissolved and we are primarily dealing with bronchospasm at this point vs cough/gag from pill to point of now  secondary Gerd producing cyclical ocugh Plan Admit to med surg Place her on maintenance BDs Continue aggressive allergy treatment for PND Add BID PPI for reflux Empiric levaquin for possible bronchitis/aspiration Systemic steroids  F/u cxr in am At this point doesn't look like she needs a bronch as CXR is clear.  2) head ache: this is chronic and we will treat symptomatically.   3) h/o HTN Plan Cont home rx.   4) head/neck pain Plan Will get C spine imaging in am   Sandrea Hughs, MD Pulmonary and Critical Care Medicine [facility] Cell [phone] After 5:30 PM or weekends, call [phone]

## 2012-06-16 NOTE — ED Provider Notes (Signed)
History     CSN: [id number]  Arrival date & time 06/16/12  3664   First MD Initiated Contact with Patient 06/16/12 [phone]      Chief Complaint  Patient presents with  . Wheezing  . pill stuck in throat     (Consider location/radiation/quality/duration/timing/severity/associated sxs/prior treatment) HPI Comments: [name] is a 77 y.o. Female who presents for evaluation of difficulty swallowing a pill. She is taking her usual morning medications, when she choked on her B complex tablet. This caused her to cough. She then began wheezing. She did not get the tablet out. Since that time. She feels that she has been able to swallow water without difficulty. She denies shortness of breath, chest pain, weakness, or dizziness. She tried using her albuterol inhaler, without relief. She denies preceding illnesses. There are no other modifying factors.  Patient is a 77 y.o. female presenting with wheezing. The history is provided by the patient.  Wheezing   Past Medical History  Diagnosis Date  . Unspecified glaucoma(365.9)   . Allergic rhinitis, cause unspecified   . Unspecified asthma   . Unspecified essential hypertension   . Other and unspecified hyperlipidemia   . Gastritis   . Abdominal pain, left lower quadrant   . Diverticulosis of colon (without mention of hemorrhage)   . Irritable bowel syndrome   . Colonic polyp 04-27-2009    tubular adenoma  . Headache   . Dizziness   . Breast cancer, Left 12/20/2010  . Gallstones   . CAD (coronary artery disease)     a. 01/19/2010 s/p CABG x 3, lima->lad, vg->diag, vg->om1;  b. 06/2011 :Lexi MV: EF 84%, No ischemia.  . Bronchitis     hx of  . Pneumonia   . Hiatal hernia   . Fibromyalgia   . Esophageal reflux     hiatal hernia    Past Surgical History  Procedure Laterality Date  . Coronary angioplasty with stent placement      Stent 2007  . Open heart surgery      01/19/2010  . Coronary artery bypass graft    . Eye  surgery      bilateral cataract removal,  . Breast lumpectomy  02/06/11    left    Family History  Problem Relation Age of Onset  . Heart disease Brother   . Cancer Brother     gland cancer  . Diabetes Sister   . Breast cancer Sister   . Stroke Mother   . Stroke Father     History  Substance Use Topics  . Smoking status: Never Smoker   . Smokeless tobacco: Never Used  . Alcohol Use: No    OB History   Grav Para Term Preterm Abortions TAB SAB Ect Mult Living                  Review of Systems  Respiratory: Positive for wheezing.   All other systems reviewed and are negative.    Allergies  Choline fenofibrate; Simvastatin; Hydrocodone; Adhesive; Ceclor; Clarithromycin; Codeine; Doxycycline; Lisinopril; Penicillins; and Tobramycin-dexamethasone  Home Medications   Current Outpatient Rx  Name  Route  Sig  Dispense  Refill  . aspirin 81 MG tablet   Oral   Take 81 mg by mouth daily.           . Calcium Carbonate-Vitamin D (CALTRATE 600+D) 600-400 MG-UNIT per chew tablet   Oral   Chew 1 tablet by mouth daily.         [name]  Coenzyme Q10 (COQ10) 100 MG CAPS   Oral   Take 1 capsule by mouth daily.           . fexofenadine (ALLEGRA) 180 MG tablet   Oral   Take 180 mg by mouth daily.           . Glucosamine-Chondroit-Vit C-Mn (GLUCOSAMINE CHONDR 1500 COMPLX PO)   Oral   Take 1 tablet by mouth 3 (three) times daily.           . hyoscyamine (ANASPAZ) 0.125 MG TBDP   Sublingual   Place 1 tablet (0.125 mg total) under the tongue every 6 (six) hours as needed.   40 tablet   2   . isometheptene-acetaminophen-dichloralphenazone (MIDRIN) 65-325-100 MG capsule   Oral   Take 1 capsule by mouth every 6 (six) hours as needed for migraine.   50 capsule   5   . isosorbide mononitrate (IMDUR) 30 MG 24 hr tablet   Oral   Take 1 tablet (30 mg total) by mouth daily.   90 tablet   3   . letrozole (FEMARA) 2.5 MG tablet   Oral   Take 1 tablet (2.5 mg total) by  mouth daily.   30 tablet   5   . losartan-hydrochlorothiazide (HYZAAR) 100-12.5 MG per tablet      take 1 tablet by mouth once daily   30 tablet   12   . metoprolol (LOPRESSOR) 50 MG tablet   Oral   Take 1 tablet (50 mg total) by mouth 2 (two) times daily.   60 tablet   5     Refill Approved   . mometasone (NASONEX) 50 MCG/ACT nasal spray   Nasal   2 sprays by Nasal route daily.           . Omega-3 Fatty Acids (FISH OIL) 1200 MG CAPS      2 tabs po bid          . omeprazole (PRILOSEC) 40 MG capsule      take 1 capsule by mouth once daily 30 MINUTES BEFORE THE FIRST MEAL OF THE DAY   30 capsule   6   . traMADol (ULTRAM) 50 MG tablet   Oral   Take 50 mg by mouth as needed. For pain         . travoprost, benzalkonium, (TRAVATAN) 0.004 % ophthalmic solution   Both Eyes   Place 1 drop into both eyes at bedtime.          . vitamin B-12 (CYANOCOBALAMIN) 1000 MCG tablet   Oral   Take 1,000 mcg by mouth daily.           . Vitamin D, Ergocalciferol, (DRISDOL) 50000 UNITS CAPS   Oral   Take 50,000 Units by mouth every 14 (fourteen) days. Every other week         . nitroGLYCERIN (NITROSTAT) 0.4 MG SL tablet      Take one tablet  By mouth SL every 5 minutes for a total of 3 doses as needed for chest pain.   25 tablet   3     BP 191/84  Pulse 74  Temp(Src) 97.6 F (36.4 C) (Oral)  Resp 18  SpO2 98%  Physical Exam  Nursing note and vitals reviewed. Constitutional: She is oriented to person, place, and time. She appears well-developed and well-nourished. She appears distressed (mild distress).  HENT:  Head: Normocephalic and atraumatic.  Eyes: Conjunctivae and EOM are normal. Pupils are equal,  round, and reactive to light.  Neck: Normal range of motion and phonation normal. Neck supple.  Cardiovascular: Normal rate, regular rhythm and intact distal pulses.   Pulmonary/Chest: Effort normal. No respiratory distress. She exhibits no tenderness.  No  stridor.  Wheezing left lung. Normal air movement bilaterally. No rhonchi.  Abdominal: Soft. She exhibits no distension. There is no tenderness. There is no guarding.  Musculoskeletal: Normal range of motion.  Neurological: She is alert and oriented to person, place, and time. She has normal strength. She exhibits normal muscle tone.  Skin: Skin is warm and dry.  Psychiatric: She has a normal mood and affect. Her behavior is normal. Judgment and thought content normal.    ED Course  Procedures (including critical care time)  Medications  albuterol (PROVENTIL) (5 MG/ML) 0.5% nebulizer solution 5 mg (5 mg Nebulization Given 06/16/12 1007)  ipratropium (ATROVENT) nebulizer solution 0.5 mg (0.5 mg Nebulization Given 06/16/12 1007)  acetaminophen (TYLENOL) tablet 650 mg (650 mg Oral Given 06/16/12 1212)  traMADol (ULTRAM) tablet 50 mg (50 mg Oral Given 06/16/12 1408)    Reeval.: 12:20- she continues to be uncomfortable with wheezing and a sensation of shortness of breath. She now complains of headache. Lung exam, repeated, reveals persistent decreased air movement, left greater than right. Now scattered left-sided rhonchi. There is no audible wheezing. Repeat vital signs are normal. O2 saturation supplementation is normal.  Concerns exist for aspirated pill. We'll contact pulmonary medicine for assistance with  Evaluation and disposition.  Consultation: 12:30- call returned the 14:15, by Dr. Sherene Sires. He will have NPP, see the patient, to arrange for bronchoscopy. He suggested the patient be admitted to a hospitalist service, for overnight observation. Screening labs ordered, to stratify for admission.   Labs Reviewed  CBC   Dg Chest 2 View  06/16/2012  *RADIOLOGY REPORT*  Clinical Data: Recent aspiration of ingested vitamin  CHEST - 2 VIEW  Comparison: 12/11/2011  Findings: Postsurgical changes are again noted.  The heart and pulmonary vascularity are within normal limits.  The lungs are clear.  No  acute bony abnormality is noted.  Multiple cardiac stents are seen.  IMPRESSION: No acute abnormality is noted.   Original Report Authenticated By: Alcide Clever, M.D.    Nursing Notes Reviewed/ Care Coordinated, and agree without changes. Applicable Imaging Reviewed.  Interpretation of Laboratory Data incorporated into ED treatment  1. Choking, initial encounter   2. COPD (chronic obstructive pulmonary disease)       MDM  Choking episode, associated with swallowing pill. Symptoms are consistent with aspiration of either fluid or pill fragment.    Plan: Admit    Flint Melter, MD 06/16/12 1622

## 2012-06-16 NOTE — ED Notes (Signed)
Pt reports took some pills this morning and one got stuck in her throat, "started a reaction" with burning in the esophagus, coughing at present and reports wheezing. Pt reports did use inhaler but with no relief. Denies pain

## 2012-06-17 ENCOUNTER — Observation Stay (HOSPITAL_COMMUNITY): Payer: Medicare Other

## 2012-06-17 DIAGNOSIS — R0989 Other specified symptoms and signs involving the circulatory and respiratory systems: Secondary | ICD-10-CM | POA: Diagnosis not present

## 2012-06-17 DIAGNOSIS — M5020 Other cervical disc displacement, unspecified cervical region: Secondary | ICD-10-CM | POA: Diagnosis not present

## 2012-06-17 DIAGNOSIS — M47812 Spondylosis without myelopathy or radiculopathy, cervical region: Secondary | ICD-10-CM | POA: Diagnosis not present

## 2012-06-17 DIAGNOSIS — R05 Cough: Secondary | ICD-10-CM | POA: Diagnosis not present

## 2012-06-17 LAB — BASIC METABOLIC PANEL
CO2: 24 mEq/L (ref 19–32)
Chloride: 92 mEq/L — ABNORMAL LOW (ref 96–112)
Glucose, Bld: 135 mg/dL — ABNORMAL HIGH (ref 70–99)
Potassium: 4.6 mEq/L (ref 3.5–5.1)
Sodium: 130 mEq/L — ABNORMAL LOW (ref 135–145)

## 2012-06-17 LAB — CBC
HCT: 39.6 % (ref 36.0–46.0)
Hemoglobin: 13.7 g/dL (ref 12.0–15.0)
MCH: 29.8 pg (ref 26.0–34.0)
MCV: 86.3 fL (ref 78.0–100.0)
RBC: 4.59 MIL/uL (ref 3.87–5.11)
WBC: 6.9 10*3/uL (ref 4.0–10.5)

## 2012-06-17 MED ORDER — PREDNISONE 10 MG PO TABS: ORAL_TABLET | ORAL | Status: DC

## 2012-06-17 NOTE — Care Management (Signed)
CM spoke with patient concerning discharge planning. PTA pt independent, able to provide self tx to MD appts. Pt uses [facility] on [address].  Per pt resides at home with husband. No DME or HH services requested. No other needs assessed at this time.   Roxy Manns Odile Veloso,RN,BSN [phone]

## 2012-06-17 NOTE — Progress Notes (Signed)
Pt discharged home with spouse in stable condition. Discharge instructions given to patient. Pt verbalized understanding. Script called into pt's pharmacy.

## 2012-06-17 NOTE — Discharge Summary (Signed)
Name: [name]  MRN: [id number]  DOB: 15-Dec-1935  ADMISSION DATE: 06/16/2012  CONSULTATION DATE: 4/22  REFERRING MD : Effie Shy  PRIMARY SERVICE: PCCM  CHIEF COMPLAINT: Dyspnea and cough   Admitting dx ALLERGIC RHINITIS  GERD  Headache  Chronic asthmatic bronchitis with acute exacerbation   Discharge dx ALLERGIC RHINITIS  GERD  Headache  Chronic asthmatic bronchitis with acute exacerbation   BRIEF PATIENT DESCRIPTION:  77 year old female f/b SN w/ sig h/o reactive airway disease and seasonal allergies. Presents to ER on 4/22 w/ CC: wheezing and SOB after possible pill aspiration.   SIGNIFICANT EVENTS / STUDIES:  C spine flex/extension films: Multilevel cervical spondylosis detailed above. In this patient  with left upper extremity radicular symptoms, potentially foraminal stenosis at C5-C6 could affect the C6 nerve. Less pronounced bony foraminal stenosis at other levels on the left.  SUBJECTIVE:  Elderly wf nad in ER p neb able to lie back comfortably at 30 deg RA   VITAL SIGNS:  Temp: [97.6 F (36.4 C)-98.5 F (36.9 C)] 97.6 F (36.4 C) (04/23 0554)  Pulse Rate: [72-78] 72 (04/23 0554)  Resp: [18] 18 (04/23 0554)  BP: (160-169)/(73-80) 169/80 mmHg (04/23 0554)  SpO2: [92 %-98 %] 92 % (04/23 0926)  Weight: [60.328 kg (133 lb)] 60.328 kg (133 lb) (04/22 1648)  Room air   PHYSICAL EXAMINATION:  General: No acute distress, well nourished 77 year old female  Neuro: No focal def .  HEENT: MM moist Cardiovascular: rrr  Lungs:clear  Abdomen: Non-tender + bowel sounds  Musculoskeletal: Intact  Skin: Intact   Recent Labs  Lab  06/16/12 1530  06/17/12 0550   NA  125*  130*   K  4.3  4.6   CL  88*  92*   CO2  27  24   BUN  11  12   CREATININE  0.49*  0.48*   GLUCOSE  113*  135*     Recent Labs  Lab  06/16/12 1530  06/17/12 0550   HGB  13.8  13.7   HCT  38.1  39.6   WBC  8.9  6.9   PLT  295  266   PCXR: no acute process  Principal Problem:  Foreign  body aspiration  Active Problems:  ALLERGIC RHINITIS  GERD  Headache  Chronic asthmatic bronchitis with acute exacerbation   ASSESSMENT / PLAN:  1) acute asthmatic exacerbation in the setting of possible foreign body aspiration (pill). Probably complicated by allergic rhinitis, flare of seasonal allergies and increased gastric reflux. Her CXR is clear w/ no evidence of airway occlusion. It is possible that the pill has broken up or been coughed up and we are primarily dealing with bronchospasm at this point vs cough/gag from pill to point of now secondary Gerd producing cyclical cough. She was treated with bronchodilators, steroids and GERD therapy. Now clinically back to baseline. CXR remained clear  Plan  Home on routine regimen Rapid pred taper F/u with nadel placed.   2) head ache: this is chronic and was will treated symptomatically.  3) h/o HTN  Plan  Cont home rx.   4) head/neck pain: C Spine: Multilevel cervical spondylosis, potentially foraminal stenosis at C5-C6 could affect the C6 nerve  Plan  Can look at out-patient referral   Discharge meds   Medication List    TAKE these medications       ALLEGRA 180 MG tablet  Generic drug:  fexofenadine  Take 180 mg by  mouth daily.     aspirin 81 MG tablet  Take 81 mg by mouth daily.     CALTRATE 600+D 600-400 MG-UNIT per chew tablet  Generic drug:  Calcium Carbonate-Vitamin D  Chew 1 tablet by mouth daily.     CoQ10 100 MG Caps  Take 1 capsule by mouth daily.     Fish Oil 1200 MG Caps  2 tabs po bid     GLUCOSAMINE CHONDR 1500 COMPLX PO  Take 1 tablet by mouth 3 (three) times daily.     hyoscyamine 0.125 MG Tbdp  Commonly known as:  ANASPAZ  Place 1 tablet (0.125 mg total) under the tongue every 6 (six) hours as needed.     isometheptene-acetaminophen-dichloralphenazone 65-325-100 MG capsule  Commonly known as:  MIDRIN  Take 1 capsule by mouth every 6 (six) hours as needed for migraine.     isosorbide  mononitrate 30 MG 24 hr tablet  Commonly known as:  IMDUR  Take 1 tablet (30 mg total) by mouth daily.     letrozole 2.5 MG tablet  Commonly known as:  FEMARA  Take 1 tablet (2.5 mg total) by mouth daily.     losartan-hydrochlorothiazide 100-12.5 MG per tablet  Commonly known as:  HYZAAR  take 1 tablet by mouth once daily     metoprolol 50 MG tablet  Commonly known as:  LOPRESSOR  Take 1 tablet (50 mg total) by mouth 2 (two) times daily.     NASONEX 50 MCG/ACT nasal spray  Generic drug:  mometasone  2 sprays by Nasal route daily.     nitroGLYCERIN 0.4 MG SL tablet  Commonly known as:  NITROSTAT  Take one tablet  By mouth SL every 5 minutes for a total of 3 doses as needed for chest pain.     omeprazole 40 MG capsule  Commonly known as:  PRILOSEC  take 1 capsule by mouth once daily 30 MINUTES BEFORE THE FIRST MEAL OF THE DAY     predniSONE 10 MG tablet  Commonly known as:  DELTASONE  5 tabs daily x1d, then 4 tabs daily x 1d, then 3 tabs daily for 1d, then 2 tab daily for 1d, then 1 tab daily for 1d     traMADol 50 MG tablet  Commonly known as:  ULTRAM  Take 50 mg by mouth as needed. For pain     TRAVATAN 0.004 % ophthalmic solution  Generic drug:  travoprost (benzalkonium)  Place 1 drop into both eyes at bedtime.     vitamin B-12 1000 MCG tablet  Commonly known as:  CYANOCOBALAMIN  Take 1,000 mcg by mouth daily.     Vitamin D (Ergocalciferol) 50000 UNITS Caps  Commonly known as:  DRISDOL  Take 50,000 Units by mouth every 14 (fourteen) days. Every other week       Activity Resume normal activity  Follow up: Dr [name] 5/7  Sandrea Hughs, MD Pulmonary and Critical Care Medicine [facility] Cell [phone] After 5:30 PM or weekends, call [phone]

## 2012-06-19 ENCOUNTER — Other Ambulatory Visit: Payer: Self-pay | Admitting: Oncology

## 2012-06-19 ENCOUNTER — Telehealth: Payer: Self-pay | Admitting: Pulmonary Disease

## 2012-06-19 MED ORDER — LEVOFLOXACIN 500 MG PO TABS: 500.0000 mg | ORAL_TABLET | Freq: Every day | ORAL | Status: DC

## 2012-06-19 NOTE — Telephone Encounter (Signed)
I spoke with pt. She c/o cough w/ yellow phlem and wheezing. Denies any chest tx and increase SOB. Pt was d/c from the hospital on 06/17/12 and was giving prednisone but no ABX. She is requesting an ABX. Please advise SN thanks Last OV 03/31/12 Pending 07-03-12 Allergies  Allergen Reactions  . Choline Fenofibrate Other (See Comments)    REACTION: pt states INTOL to Trilipix w/ "thigh burning"  . Simvastatin Other (See Comments)    REACTION: pt states INTOL to STATINS \\T \ refuses to restart  . Hydrocodone     Nightmare after taking cough syrup w/hydrocodone  . Adhesive (Tape) Rash  . Ceclor (Cefaclor) Rash  . Clarithromycin Rash  . Codeine Nausea Only  . Doxycycline Rash  . Lisinopril Cough    Developed ACE cough...  . Penicillins Itching and Rash    At injection site  . Tobramycin-Dexamethasone Rash

## 2012-06-19 NOTE — Telephone Encounter (Signed)
I spoke with pt and is aware of SN recs. rx has been sent. Nothing further was needed

## 2012-06-19 NOTE — Telephone Encounter (Signed)
Per SN----  levaquin 500 mg  #7  1 daily Align once daily 

## 2012-06-19 NOTE — Telephone Encounter (Signed)
Due f/u in June

## 2012-06-22 DIAGNOSIS — M5412 Radiculopathy, cervical region: Secondary | ICD-10-CM | POA: Diagnosis not present

## 2012-06-22 DIAGNOSIS — M5030 Other cervical disc degeneration, unspecified cervical region: Secondary | ICD-10-CM | POA: Diagnosis not present

## 2012-06-22 DIAGNOSIS — M999 Biomechanical lesion, unspecified: Secondary | ICD-10-CM | POA: Diagnosis not present

## 2012-06-22 DIAGNOSIS — M9981 Other biomechanical lesions of cervical region: Secondary | ICD-10-CM | POA: Diagnosis not present

## 2012-07-01 ENCOUNTER — Inpatient Hospital Stay: Payer: Medicare Other | Admitting: Pulmonary Disease

## 2012-07-01 DIAGNOSIS — H4011X0 Primary open-angle glaucoma, stage unspecified: Secondary | ICD-10-CM | POA: Diagnosis not present

## 2012-07-02 DIAGNOSIS — J309 Allergic rhinitis, unspecified: Secondary | ICD-10-CM | POA: Diagnosis not present

## 2012-07-03 ENCOUNTER — Encounter: Payer: Self-pay | Admitting: Pulmonary Disease

## 2012-07-03 ENCOUNTER — Ambulatory Visit (INDEPENDENT_AMBULATORY_CARE_PROVIDER_SITE_OTHER): Payer: Medicare Other | Admitting: Pulmonary Disease

## 2012-07-03 VITALS — BP 136/60 | HR 64 | Temp 97.4°F | Ht 60.0 in | Wt 136.8 lb

## 2012-07-03 DIAGNOSIS — Z5189 Encounter for other specified aftercare: Secondary | ICD-10-CM

## 2012-07-03 DIAGNOSIS — K449 Diaphragmatic hernia without obstruction or gangrene: Secondary | ICD-10-CM

## 2012-07-03 DIAGNOSIS — T17908D Unspecified foreign body in respiratory tract, part unspecified causing other injury, subsequent encounter: Secondary | ICD-10-CM

## 2012-07-03 DIAGNOSIS — M542 Cervicalgia: Secondary | ICD-10-CM

## 2012-07-03 DIAGNOSIS — T17900D Unspecified foreign body in respiratory tract, part unspecified causing asphyxiation, subsequent encounter: Secondary | ICD-10-CM

## 2012-07-03 DIAGNOSIS — K219 Gastro-esophageal reflux disease without esophagitis: Secondary | ICD-10-CM

## 2012-07-03 DIAGNOSIS — J45909 Unspecified asthma, uncomplicated: Secondary | ICD-10-CM

## 2012-07-03 DIAGNOSIS — I10 Essential (primary) hypertension: Secondary | ICD-10-CM

## 2012-07-03 DIAGNOSIS — E785 Hyperlipidemia, unspecified: Secondary | ICD-10-CM

## 2012-07-03 DIAGNOSIS — I2510 Atherosclerotic heart disease of native coronary artery without angina pectoris: Secondary | ICD-10-CM | POA: Diagnosis not present

## 2012-07-03 DIAGNOSIS — J309 Allergic rhinitis, unspecified: Secondary | ICD-10-CM | POA: Diagnosis not present

## 2012-07-03 DIAGNOSIS — C50512 Malignant neoplasm of lower-outer quadrant of left female breast: Secondary | ICD-10-CM

## 2012-07-03 DIAGNOSIS — R51 Headache: Secondary | ICD-10-CM

## 2012-07-03 DIAGNOSIS — K5730 Diverticulosis of large intestine without perforation or abscess without bleeding: Secondary | ICD-10-CM

## 2012-07-03 DIAGNOSIS — IMO0001 Reserved for inherently not codable concepts without codable children: Secondary | ICD-10-CM

## 2012-07-03 MED ORDER — METHOCARBAMOL 500 MG PO TABS: 500.0000 mg | ORAL_TABLET | Freq: Three times a day (TID) | ORAL | Status: DC | PRN

## 2012-07-03 MED ORDER — TRAMADOL HCL 50 MG PO TABS: 50.0000 mg | ORAL_TABLET | Freq: Three times a day (TID) | ORAL | Status: DC | PRN

## 2012-07-03 NOTE — Patient Instructions (Addendum)
Today we updated your med list in our EPIC system...    Continue your current medications the same...  For your neck & arm pain>>    We will sched an MRI scan to check for "stenosis" & see if a shot would be appropriate...    In the meanwhile use your Tramadol 50 mg up to 3 times daily & take it w/ Tylenol to potentiate it's effect...    We will add a muscle relaxer- ROBAXIN 500mg  up to 3 times daily as well...    you may also benefit from a heating pad etc...  For your "reactive hypoglycemia">>    Check w/ your "personal dietician" to help w/ dietary adjustments...    Check the internet for info on foods w/ a LOW GLYCEMIC INDEX (and avoid foods w/ a hi glycemic index)...    Try eating more freq SMALLER meals...  Call for any questions...  Let's plan a follow up visit in 89mo, sooner if needed for problems.Marland KitchenMarland Kitchen

## 2012-07-04 ENCOUNTER — Encounter: Payer: Self-pay | Admitting: Pulmonary Disease

## 2012-07-04 NOTE — Progress Notes (Addendum)
Subjective:    Patient ID: [name], female    DOB: Jul 15, 1935, 77 y.o.   MRN: [id number]  HPI 77 y/o WF here for a follow up visit... she has multiple medical problems including AR & Asthma;  HBP;  CAD followed by Walker Kehr & s/p 3 vessel CABG 11/11 by DrGearhardt;  Hyperlipidemia followed in the [facility];  GERD/ IBS/ Divertics;  Breast Cancer diagnosed 12/12;  Fibromyalgia;  Chr HAs & dizziness...  ~  April 02, 2011:  75mo ROV & [name] has had another event filled interval> this time w/ mammogram showing a left breast nodule & surg (partial mastectomy & sentinel node bx) by [name] 12/12 that proved to be an invasive ductal carcinoma, 2.3cm size, w/ neg lymphovasc invasion, clear margins (but close at 1mm), & neg nodes;  subseq XRT by DrKindard (had her 11th treatment today); and Oncology eval by DrMagrinat who plans hormone Rx later (no chemo she says)... She is tolerating treatment well & spirits are up, [name] had to aspirate a seroma & it is now resolved...    Pat had her usual autumn URI, AB exac in Nov RX w/ ZPak, Pred, Mucinex, Hydromet but reacted to the hydrocodone & prefers OTC non-narcotic cough suppression & we discussed MUCINEX DM & DELSYM (offered Tussionex if ineffective);  She had the Flu vaccine 10/12...     She denies CP palpit, dizzy, syncope, incr SOB, edema, etc; Cardiac stable & she is thrilled w/ that...    She continues f/u in the [facility] Clinic & has been titrated up to Cres10mg  M&F, plus 5mg  on Wednes; last FLP 10/12 was the best yet w/ TChol 167, TG 203, HDL 54, LDL 88...   ~  October 01, 2011:  77mo ROV & [name] persists w/ mult somatic complaints> eg. Cramp under left breast ("knots up", thinks it's a hernia); intermittent right arm numbness & in the middle 3 fingers; she has chr fibromyalgia & uses Tramadol/ Tylenol as needed...    She saw DrVanWinkle 7/13 for her allergies> on allergy shots weekly, Xopenex inhaler as needed, Allegra prn, Nasonex daily and doing well...  She saw [facility] 6/13 w/ known CAD, s/p CABG & intermittent CWP likely related to her FM; neg stress test- no ischemia, good LVF; she is walking some & notes "pressure" on hills...    She had f/u DrMagrinat 6/13 & he wondered if her systemic symptoms might not be related to Femara Rx...    We reviewed prob list, meds, xrays and labs> see below for updates >> requests Shingles vaccine Rx (lost prev Rx)... CXR 5/13 showed s/p CABG, clear lungs, NAD...  LABS 6/13:  Chems- ok;  CBC- wnl;  Ca27.29=20...  ~  March 31, 2012:  75mo ROV & [name] admits that she's not feeling well today- c/o occipHA w/ band-like extension around her head, feels funny- sl dizzy & light headed, occuring daily; she has Tramadol for prn use but she says she doesn't like taking it- prefers Midrin (refilled), offered Neuro/HA referral & she will consider it...     AR/ Asthma>  She stopped the Symbicort as well & not on any inhaled meds at present; denies cough, sput, ch in dyspnea, etc.    HBP>  controlled on Metop50Bid & Hyzaar100-12.5 w/ BP=144/72 today; denies visual changes, CP, palipit, dizziness, syncope, dyspnea, edema, etc...     CAD>  CP 11/11=> cath w/ in-stent restenosis, then CABG x3 by DrGearhardt; re-hospitalized 6/12 by [facility] for CP> cath showed severe 3 vessel  CAD w/ 3/3 grafts patent & norm LVF w/ EF= 65-70%; Myoview 5/13 showed no ischemia & EF>80%; no change in meds- on Imdur30; & DrNishan noted that med rx is lim by side effects.    Hyperlipid>  followed in the [facility] & FLP reviewed on Cres10- 2d per week but she was unable to tolerate even this low dose; off all meds on diet alone...    GI- GERD, Divertics> on Omep40; followed by DrPatterson & seen 9/13 f/u for ?diverticulitis- improved after Cipro/Flagyl but CTAbd was neg x diverticulosis; he rec high fiber diet & Citrucel...    Hx left breast cancer> she had f/u Oncology 12/13- back on Femara & tol ok; last mammogram 10/13 was ok as well...    Others>  she has persistant  FM symptoms and chr HAs, uses Vicodin Prn... We reviewed prob list, meds, xrays and labs> see below for updates >> she had the 2013 Flu vaccine 10/13...  EKG 9/13 showed NSR, rate64, NSSTTWA, NAD...  CXR 10/13 showed heart at upper lim of norm, prior CABG, mild hyperinflation, clear, NAD... Abd Ultrasound 10/13 by Walker Kehr for eval CP showed> norm GB, liver, kidneys, & Ao- neg sonar... LABS 10-12/13 showed Chems- wnl;  CBC- wnl LABS 2/14:  FLP- ok x LDL=110;  TSH=1.33   ~  Jul 03, 2012:  77mo ROV & post hospital visit> [facility] 4/22-23/14 after she presented w/ a pill (B-complex vit) aspiration and wheezing, SOB, asthma exac; she was able to cough it all out & did not require bronchoscopy; she denied dysphagia, n/v, etc;  Still not feeling well, min residual cough, notes some neck pain (XRay showed Cspine spondylosis) & she wants MRI=> referral to NS or whatever is necessary; we reviewed Rx w/ Tramadol, Robaxin as well... Her CC today sounds like reactive hypoglycemia & we reviewed diet adjustment- low glycemic index foods, more freq small meals if nec, etc...     We reviewed prob list, meds, xrays and labs> see below for updates >>            Problem List:  GLAUCOMA (ICD-365.9) - she is sched for right cataract surg by DrBevis in Feb...  ALLERGIC RHINITIS (ICD-477.9) - she uses Allegra & Nasonex Prn; on Allergy shots weekly x yrs...  ASTHMA & Asthmatic Bronchitis >> off Symbicort & using XOPENEX HFA as needed rescue inhaler... ~  5/11:  notes incr chest symptoms in the heat & rec to take the Symbicort 2sp Bid... ~  2012:  After her CABG & repeat hosp by Cards she is noted to be off her inhalers... ~  10/12:  Treated w/ Zpak, Pred, Mucinex, plus her Symbicort80 & improved... ~  CXR 5/13 showed s/p CABG, clear lungs, NAD (she went to the Er w/ cough & had neg eval). ~  7/13:  DrVanWinkle stopped her Symbicort & switched to [facility] HFA as needed... ~  2/14: she does not list any inhaled meds at  present & states breathing OK w/o exac... ~  4/14: she was hosp after pill asp w/ asthma exac & improved w/ supportive Rx, didn't require bronch, CXR was clear, grad improved post hosp... ~  CXR 4/14 showed normal heart size, s/p CABG, sl tort Ao, bilat apic pleural scarring, NAD...   HYPERTENSION (ICD-401.9) - controlled on TOPROL 50mg Bid & HYZAAR 100-12.5 daily (off Lisinopril due to ACE cough)...  ~  8/12: BP=  128/84 & tol meds well; denies HA, fatigue, visual changes, CP, palipit, syncope, dyspnea, edema, etc... ~  2/13: BP= 140/64 & she remains essentially asymptomatic on Rx... ~  8/13:  BP= 132/64 & she denies CP, palpit, SOB, edema, etc... ~  2/14: controlled on Toprol & Hyzaar w/ BP 144/72 today; denies visual changes, CP, palipit, dizziness, syncope, dyspnea, edema, etc... ~  5/14:  BP= 136/60 & she remains stable...  CORONARY ARTERY DISEASE (ICD-414.00) - on ASA 81mg /d (intol to 325 she says) & off Plavix per Walker Kehr...  Cardiolite 9/08 showed CP & HBP response, EF+85%... had cath 10/08 w/ single vessel dis w/ mod ostial LAD stenosis and patent LAD stent, normal LVF.[name]. option for med Rx vs off pump LIMA... she notes some CP/ pressure "burning" when walking up a certain hill during her daily walks, but states this is stable and "no worse"... ~  Myoview 9/09 was neg- no scar or ischemia, EF= 89%, hypertensive response- Lisinopril10 added. ~  f/u Myoview 3/11 was neg- no scar or ischemia, EF= 86%, norm BP response, fair exerc capacity. ~  saw Walker Kehr 5/11- no CP, he stopped her Plavix, stable- continue other meds same. ~  She had recurrent CP 11/11 w/ in-stent restenosis on cath> subseq 3 vessel CABG by DrGearhart & doing well since then... ~  6/12:  Hosp w/ CP & repeat cath showed CAD & 3/3 grafts patent, EF= 65-70% ~  Myoview 5/13 was felt to be a normal stress nuclear study- normal wall motion, no ischemia, EF=84% ~  2/14: CP 11/11=> cath w/ in-stent restenosis, then CABG x3 by  DrGearhardt; re-hospitalized 6/12 by [facility] for CP> cath showed severe 3 vessel CAD w/ 3/3 grafts patent & norm LVF w/ EF= 65-70%; Myoview 5/13 showed no ischemia & EF>80%; no change in meds; & Walker Kehr noted that med rx is limited by side effects...  HYPERLIPIDEMIA (ICD-272.4) - on CRESTOR 10mg - 1/2 on M&F, FISH OIL 1200mg  Tid + Flax Seed Oil... she has been intol to statins in the past>  tried low dose Trilipix but stopped this due to "thigh burning">  now followed in the Lipid Clinic--- ~  FLP 7/08 showed TChol 217, TG 236, HDL 46, LDL 150...  ~  FLP 6/09 showed TChol 233, TG 263, HDL 46, LDL 150... she agrees to try LOW DOSE Trilipix. ~  FLP 11/09 showed TChol 162, TG 167, HDL 45, LDL 83... ~  FLP 11/10 showed TChol 240, TG 271, HDL 45, LDL 150... LipClin Rx w/ FishOil & FlaxSeedOil. ~  FLP 2/11 showed TChol 285, TG 156, HDL 79, LDL 180... LC is aware & working w/ her regularly. ~  FLP 5/11 showed TChol 277, TG 241, HDL55, LDL 193...  LC added Cres10 just 2d per week. ~  FLP 10/11 showed TChol 198, TG 215, HDL 53, LDL 109... much improved on Cres10 twice weekly. ~  She continues to f/u w/ Lipid clinic & they titrated up the Cres10 on M&F, plus 5mg  on Wednes> ~  FLP  10/12 was the best yet w/ TChol 167, TG 203, HDL 54, LDL 88...  ~  FLP 4/13 showed TChol 193, TG 176, HDL 57, LDL 101 ~  She is still followed in the [facility]- note 6/13 reviewed (at that time on Cres10 on M&F, 5mg  on W) but she subseq cut herself down to 1/2 on M&F due to nausea & aching. ~  FLP 2/14 on diet alone showed TChol 194, TG 143, HDL 55, LDL 110... We reviewed diet, exercise, etc...  GERD (ICD-530.81) - on OMEPRAZOLE 40mg /d... last EGD by [facility] 8/07 revealed  sm HH, & gastritis and dilatation done... (RUT=neg, PPI Rx)... LEVSIN 0.125mg  helps her esoph spasm... Prn Phenergan for nausea.  DIVERTICULOSIS, COLON (ICD-562.10) & IRRITABLE BOWEL SYNDROME (ICD-564.1) ~  colonoscopy 2/03 by DrPatterson showed divertics and 5 mm  polyp... ~  colonoscopy 3/11 showed divertics & cecal polyp= tubular adenoma, w/ f/u planned 27yrs. ~  9/13: seen by DrPatterson f/u for ?diverticulitis- improved after Cipro/Flagyl but CTAbd was neg x diverticulosis; he rec high fiber diet & Citrucel...  ? GALLSTONES>>  Diagnosed by DrPatterson and referred to [facility] DrWilson, pt asked to call prn fopr incr symptoms to consider surg... ~  Abd Ultrasound 10/13 by Walker Kehr for eval CP showed> norm GB, liver, kidneys, & Ao- neg sonar.  BREAST CANCER >> Abn mammogram 10/12 showing a left breast nodule & subseq surg (partial mastectomy & sentinel node bx) by [name] 12/12 that proved to be an invasive ductal carcinoma, 2.3cm size, w/ neg lymphovasc invasion, clear margins (but close at 1mm), & neg nodes; she had XRT by DrKindard (had her 11th treatment today); and Oncology eval by DrMagrinat who plans hormone Rx later (no chemo she says)...  ~  2/13: She is tolerating treatment well & spirits are up, [name] had to aspirate a seroma & it is now resolved... ~  She is followed by DrMagrinat, Streck, Kinard- now on [facility] 2.5mg /d...  DJD, Neck Pain >> ~  XRays Cspine 4/14 in hosp showed multilevel cerv spondylosis, 1-51mm anterolisthesis C4 on C5, DDD, foraminal stenosis at C5-6...  ~  5/14:  MRI Cspine => pending  FIBROMYALGIA (ICD-729.1) - she c/o chr fatigue, aching/ sore, on Tramadol & Tylenol, but most benefit from low dose Hydrocodone ~1/2 tab Prn... I have recommended an increase exercise program to her... ~  5/11:  Vit D level = 50 on 50000 u weekly by Rock Nephew, NP  HEADACHE (ICD-784.0) - eval at [facility] clinic in 2002, but she notes Rx didn't help... ~  3/11:  presented w/ muscle contraction HA's> Rx Tramadol 50mg  Q6H Prn.  DIZZINESS, CHRONIC (ICD-780.4) - MRI 2/09 per Walker Kehr showed atrophy, sm vessel dis, NAD...  Health Maintenance: ~  GI:  DrPatterson & up to date on colon screening. ~  GYN:  yearly f/u w/ Rock Nephew, NP for PAP,  Mammogram at [facility], ?last BMD. ~  Immunizations:  yearly Flu vaccines in fall, Pneumovax in 2007?, TDAP given 5/11...   Past Surgical History  Procedure Laterality Date  . Coronary angioplasty with stent placement      Stent 2007  . Open heart surgery      01/19/2010  . Coronary artery bypass graft    . Eye surgery      bilateral cataract removal,  . Breast lumpectomy  02/06/11    left    Outpatient Encounter Prescriptions as of 07/03/2012  Medication Sig Dispense Refill  . aspirin 81 MG tablet Take 81 mg by mouth daily.        . Coenzyme Q10 (COQ10) 100 MG CAPS Take 1 capsule by mouth daily.        . fexofenadine (ALLEGRA) 180 MG tablet Take 180 mg by mouth daily.        . Glucosamine-Chondroit-Vit C-Mn (GLUCOSAMINE CHONDR 1500 COMPLX PO) Take 1 tablet by mouth 3 (three) times daily.        . hyoscyamine (ANASPAZ) 0.125 MG TBDP Place 1 tablet (0.125 mg total) under the tongue every 6 (six) hours as needed.  40 tablet  2  . isometheptene-acetaminophen-dichloralphenazone (MIDRIN)  65-325-100 MG capsule Take 1 capsule by mouth every 6 (six) hours as needed for migraine.  50 capsule  5  . isosorbide mononitrate (IMDUR) 30 MG 24 hr tablet Take 1 tablet (30 mg total) by mouth daily.  90 tablet  3  . letrozole (FEMARA) 2.5 MG tablet TAKE 1 TABLET BY MOUTH ONCE DAILY  30 tablet  0  . losartan-hydrochlorothiazide (HYZAAR) 100-12.5 MG per tablet take 1 tablet by mouth once daily  30 tablet  12  . metoprolol (LOPRESSOR) 50 MG tablet Take 1 tablet (50 mg total) by mouth 2 (two) times daily.  60 tablet  5  . mometasone (NASONEX) 50 MCG/ACT nasal spray 2 sprays by Nasal route daily.        . nitroGLYCERIN (NITROSTAT) 0.4 MG SL tablet Take one tablet  By mouth SL every 5 minutes for a total of 3 doses as needed for chest pain.  25 tablet  3  . Omega-3 Fatty Acids (FISH OIL) 1200 MG CAPS 2 tabs po bid       . omeprazole (PRILOSEC) 40 MG capsule take 1 capsule by mouth once daily 30 MINUTES BEFORE  THE FIRST MEAL OF THE DAY  30 capsule  6  . traMADol (ULTRAM) 50 MG tablet Take 1 tablet (50 mg total) by mouth 3 (three) times daily as needed. For pain  90 tablet  5  . travoprost, benzalkonium, (TRAVATAN) 0.004 % ophthalmic solution Place 1 drop into both eyes at bedtime.       . vitamin B-12 (CYANOCOBALAMIN) 1000 MCG tablet Take 1,000 mcg by mouth daily.        . Vitamin D, Ergocalciferol, (DRISDOL) 50000 UNITS CAPS Take 50,000 Units by mouth every 14 (fourteen) days. Every other week      . [DISCONTINUED] traMADol (ULTRAM) 50 MG tablet Take 50 mg by mouth as needed. For pain      . methocarbamol (ROBAXIN) 500 MG tablet Take 1 tablet (500 mg total) by mouth 3 (three) times daily as needed (muscle spasm).  90 tablet  5  . [DISCONTINUED] Calcium Carbonate-Vitamin D (CALTRATE 600+D) 600-400 MG-UNIT per chew tablet Chew 1 tablet by mouth daily.      . [DISCONTINUED] levofloxacin (LEVAQUIN) 500 MG tablet Take 1 tablet (500 mg total) by mouth daily.  7 tablet  0  . [DISCONTINUED] predniSONE (DELTASONE) 10 MG tablet 5 tabs daily x1d, then 4 tabs daily x 1d, then 3 tabs daily for 1d, then 2 tab daily for 1d, then 1 tab daily for 1d  15 tablet  0   No facility-administered encounter medications on file as of 07/03/2012.    Allergies  Allergen Reactions  . Choline Fenofibrate Other (See Comments)    REACTION: pt states INTOL to Trilipix w/ "thigh burning"  . Simvastatin Other (See Comments)    REACTION: pt states INTOL to STATINS \\T \ refuses to restart  . Hydrocodone     Nightmare after taking cough syrup w/hydrocodone  . Adhesive (Tape) Rash  . Ceclor (Cefaclor) Rash  . Clarithromycin Rash  . Codeine Nausea Only  . Doxycycline Rash  . Lisinopril Cough    Developed ACE cough...  . Penicillins Itching and Rash    At injection site  . Tobramycin-Dexamethasone Rash    Current Medications, Allergies, Past Medical History, Past Surgical History, Family History, and Social History were reviewed  in [employer] record.    Review of Systems    See HPI - all other systems neg  except as noted...  The patient complains of chest pain and headaches.  The patient denies anorexia, fever, weight loss, weight gain, vision loss, decreased hearing, hoarseness, syncope, dyspnea on exertion, peripheral edema, prolonged cough, hemoptysis, abdominal pain, melena, hematochezia, severe indigestion/heartburn, hematuria, incontinence, muscle weakness, suspicious skin lesions, transient blindness, difficulty walking, depression, unusual weight change, abnormal bleeding, enlarged lymph nodes, and angioedema.   Objective:   Physical Exam    WD, WN, 77 y/o WF in NAD... GENERAL:  Alert & oriented; pleasant & cooperative... HEENT:  [place]/AT, EOM-wnl, PERRLA, EACs-clear, TMs-wnl, NOSE-clear, THROAT-clear & wnl. NECK:  Decr ROM; no JVD; normal carotid impulses w/o bruits; no thyromegaly or nodules palpated; no lymphadenopathy. CHEST:  Clear to P & A; without wheezes/ rales/ or rhonchi heard... HEART:  Regular Rhythm; without murmurs/ rubs/ or gallops detected... ABDOMEN:  Soft & nontender; normal bowel sounds; no organomegaly or masses palpated... EXT: without deformities, mild arthritic changes and +trigger points, no varicose veins/ venous insuffic/ or edema. NEURO:  CN's intact; motor testing normal; sensory testing normal; gait normal & balance OK. DERM:  No lesions noted; no rash etc...  RADIOLOGY DATA:  Reviewed in the EPIC EMR & discussed w/ the patient...  LABORATORY DATA:  Reviewed in the EPIC EMR & discussed w/ the patient...   Assessment & Plan:  Hx pill asp w/ asthma exac=> now resolved... C/O neck pain & we decided to check MRI => pending... Symptoms c/w reactive hypoglycemia => we discussed diet adjustment...   Asthma/ AR>  Stable on allergy shots & XOPENEX as needed; no recent exac...  HBP>  Controlled on Toprol & Hyzaar, continue same...  CAD>  Cath w/ patent  grafts; had neg Nuclear study; continue same meds and f/u DrNishan...  CHOL>  Followed in the Lipid Clinic & intol to all meds- FLP looks better [id number] on diet alone...  GI> GERD, Divertics, ?Gallstones>  Followed by [employer] & stones eval by DrWilson [facility]; odd that f/u sonar 9/13 did not show stones...  BREAST CANCER>  on [facility] now> treated by [name], DrMagrinat, DrKinard & records reviewed...  FM/ HAs/ etc>  On Ultram, Tylenol, etc...  Other medical issues as noted> on CoQ10, Glucosamine, Vit B12, Vit D, etc...   Patient's Medications  New Prescriptions   METHOCARBAMOL (ROBAXIN) 500 MG TABLET    Take 1 tablet (500 mg total) by mouth 3 (three) times daily as needed (muscle spasm).  Previous Medications   ASPIRIN 81 MG TABLET    Take 81 mg by mouth daily.     COENZYME Q10 (COQ10) 100 MG CAPS    Take 1 capsule by mouth daily.     FEXOFENADINE (ALLEGRA) 180 MG TABLET    Take 180 mg by mouth daily.     GLUCOSAMINE-CHONDROIT-VIT C-MN (GLUCOSAMINE CHONDR 1500 COMPLX PO)    Take 1 tablet by mouth 3 (three) times daily.     HYOSCYAMINE (ANASPAZ) 0.125 MG TBDP    Place 1 tablet (0.125 mg total) under the tongue every 6 (six) hours as needed.   ISOMETHEPTENE-ACETAMINOPHEN-DICHLORALPHENAZONE (MIDRIN) 65-325-100 MG CAPSULE    Take 1 capsule by mouth every 6 (six) hours as needed for migraine.   ISOSORBIDE MONONITRATE (IMDUR) 30 MG 24 HR TABLET    Take 1 tablet (30 mg total) by mouth daily.   LETROZOLE (FEMARA) 2.5 MG TABLET    TAKE 1 TABLET BY MOUTH ONCE DAILY   LOSARTAN-HYDROCHLOROTHIAZIDE (HYZAAR) 100-12.5 MG PER TABLET    take 1 tablet by mouth once daily  METOPROLOL (LOPRESSOR) 50 MG TABLET    Take 1 tablet (50 mg total) by mouth 2 (two) times daily.   MOMETASONE (NASONEX) 50 MCG/ACT NASAL SPRAY    2 sprays by Nasal route daily.     NITROGLYCERIN (NITROSTAT) 0.4 MG SL TABLET    Take one tablet  By mouth SL every 5 minutes for a total of 3 doses as needed for chest pain.   OMEGA-3 FATTY  ACIDS (FISH OIL) 1200 MG CAPS    2 tabs po bid    OMEPRAZOLE (PRILOSEC) 40 MG CAPSULE    take 1 capsule by mouth once daily 30 MINUTES BEFORE THE FIRST MEAL OF THE DAY   TRAVOPROST, BENZALKONIUM, (TRAVATAN) 0.004 % OPHTHALMIC SOLUTION    Place 1 drop into both eyes at bedtime.    VITAMIN B-12 (CYANOCOBALAMIN) 1000 MCG TABLET    Take 1,000 mcg by mouth daily.     VITAMIN D, ERGOCALCIFEROL, (DRISDOL) 50000 UNITS CAPS    Take 50,000 Units by mouth every 14 (fourteen) days. Every other week  Modified Medications   Modified Medication Previous Medication   TRAMADOL (ULTRAM) 50 MG TABLET traMADol (ULTRAM) 50 MG tablet      Take 1 tablet (50 mg total) by mouth 3 (three) times daily as needed. For pain    Take 50 mg by mouth as needed. For pain  Discontinued Medications   CALCIUM CARBONATE-VITAMIN D (CALTRATE 600+D) 600-400 MG-UNIT PER CHEW TABLET    Chew 1 tablet by mouth daily.   LEVOFLOXACIN (LEVAQUIN) 500 MG TABLET    Take 1 tablet (500 mg total) by mouth daily.   PREDNISONE (DELTASONE) 10 MG TABLET    5 tabs daily x1d, then 4 tabs daily x 1d, then 3 tabs daily for 1d, then 2 tab daily for 1d, then 1 tab daily for 1d

## 2012-07-07 ENCOUNTER — Ambulatory Visit (HOSPITAL_COMMUNITY)
Admission: RE | Admit: 2012-07-07 | Discharge: 2012-07-07 | Disposition: A | Discharge status: Home / Self Care | Payer: Medicare Other | Source: Ambulatory Visit | Attending: Pulmonary Disease | Admitting: Pulmonary Disease

## 2012-07-07 DIAGNOSIS — M4802 Spinal stenosis, cervical region: Secondary | ICD-10-CM | POA: Diagnosis not present

## 2012-07-07 DIAGNOSIS — M542 Cervicalgia: Secondary | ICD-10-CM | POA: Insufficient documentation

## 2012-07-07 DIAGNOSIS — M5380 Other specified dorsopathies, site unspecified: Secondary | ICD-10-CM | POA: Diagnosis not present

## 2012-07-07 DIAGNOSIS — R209 Unspecified disturbances of skin sensation: Secondary | ICD-10-CM | POA: Diagnosis not present

## 2012-07-07 DIAGNOSIS — M5030 Other cervical disc degeneration, unspecified cervical region: Secondary | ICD-10-CM | POA: Diagnosis not present

## 2012-07-07 MED ORDER — GADOBENATE DIMEGLUMINE 529 MG/ML IV SOLN
12.0000 mL | Freq: Once | INTRAVENOUS | Status: AC | PRN
  Administered 2012-07-07: 12 mL via INTRAVENOUS

## 2012-07-08 ENCOUNTER — Ambulatory Visit (HOSPITAL_COMMUNITY): Admission: RE | Admit: 2012-07-08 | Payer: Medicare Other | Source: Ambulatory Visit

## 2012-07-09 ENCOUNTER — Other Ambulatory Visit: Payer: Self-pay | Admitting: Pulmonary Disease

## 2012-07-09 ENCOUNTER — Telehealth: Payer: Self-pay | Admitting: Pulmonary Disease

## 2012-07-09 DIAGNOSIS — M542 Cervicalgia: Secondary | ICD-10-CM

## 2012-07-09 NOTE — Telephone Encounter (Signed)
Notes Recorded by Marcellus Scott, CMA on 07/09/2012 at 9:09 AM [email or web address] ------  Notes Recorded by Michele Mcalpine, MD on 07/08/2012 at 5:11 PM Please notify patient>  MRI CSpine confirms multilevel spondylosis & arthritic changes w/ areas of stenosis... We will refer to NS for their analysis...  ATC line busy, WCB. Carron Curie, CMA

## 2012-07-10 DIAGNOSIS — J309 Allergic rhinitis, unspecified: Secondary | ICD-10-CM | POA: Diagnosis not present

## 2012-07-10 NOTE — Telephone Encounter (Signed)
Pt is aware of results. Jennifer Castillo, CMA  

## 2012-07-14 DIAGNOSIS — M5412 Radiculopathy, cervical region: Secondary | ICD-10-CM | POA: Diagnosis not present

## 2012-07-14 DIAGNOSIS — M9981 Other biomechanical lesions of cervical region: Secondary | ICD-10-CM | POA: Diagnosis not present

## 2012-07-14 DIAGNOSIS — M5030 Other cervical disc degeneration, unspecified cervical region: Secondary | ICD-10-CM | POA: Diagnosis not present

## 2012-07-14 DIAGNOSIS — R51 Headache: Secondary | ICD-10-CM | POA: Diagnosis not present

## 2012-07-15 ENCOUNTER — Other Ambulatory Visit: Payer: Self-pay | Admitting: *Deleted

## 2012-07-15 ENCOUNTER — Ambulatory Visit (INDEPENDENT_AMBULATORY_CARE_PROVIDER_SITE_OTHER): Payer: Medicare Other | Admitting: Cardiovascular Disease

## 2012-07-15 ENCOUNTER — Encounter: Payer: Self-pay | Admitting: Cardiovascular Disease

## 2012-07-15 VITALS — BP 156/67 | HR 67 | Ht 59.0 in | Wt 136.4 lb

## 2012-07-15 DIAGNOSIS — I10 Essential (primary) hypertension: Secondary | ICD-10-CM | POA: Diagnosis not present

## 2012-07-15 DIAGNOSIS — I2510 Atherosclerotic heart disease of native coronary artery without angina pectoris: Secondary | ICD-10-CM

## 2012-07-15 DIAGNOSIS — E785 Hyperlipidemia, unspecified: Secondary | ICD-10-CM | POA: Diagnosis not present

## 2012-07-15 MED ORDER — NITROGLYCERIN 0.4 MG SL SUBL: SUBLINGUAL_TABLET | SUBLINGUAL | Status: DC

## 2012-07-15 NOTE — Assessment & Plan Note (Signed)
Well controlled.  Continue current medications and low sodium Dash type diet.    

## 2012-07-15 NOTE — Assessment & Plan Note (Signed)
Stable with no angina and good activity level.  Continue medical Rx Nitro called into [facility].  May need myovue to clear for cervical spine surgery if she needs it

## 2012-07-15 NOTE — Patient Instructions (Signed)
Your physician wants you to follow-up in: YEAR WITH DR NISHAN  You will receive a reminder letter in the mail two months in advance. If you don't receive a letter, please call our office to schedule the follow-up appointment.  Your physician recommends that you continue on your current medications as directed. Please refer to the Current Medication list given to you today. 

## 2012-07-15 NOTE — Progress Notes (Signed)
Patient ID: [name], female   DOB: [birth year], 77 y.o.   MRN: [id number] [name] is seen today post F/U for CAD with instent restenosis and CABG with Dr Tyrone Sage 01/19/10 . CRF;s include HTN and elevated lipids. Rx limited by side effects. Cough with ACE and myalgias with higher dose statins. She still has issues with gallstones. Prior to her CABG she had documented stones by [place] and has seen Dr Jarold Motto and Andrey Campanile. Recent headaches and bronchitis followed by Dr [name]. Recent clinic visit with good labs and tolerating lower dose statin She had been having issues with her BP meds but separating them in time seems to have solved this.  Myovue 07/15/11 reviewed and normal with no ischemia and EF over 80%  CT 9/12 no cholycystitis  Recently diagjosed breast CA On letrozole Cannot take cholesterol pills with this makes her too sore  Gets some tightness in chest going up hill but not much and not all the time Does not have nitro  Seeing Dr Phoebe Perch this week for neck problem  ROS: Denies fever, malais, weight loss, blurry vision, decreased visual acuity, cough, sputum, SOB, hemoptysis, pleuritic pain, palpitaitons, heartburn, abdominal pain, melena, lower extremity edema, claudication, or rash.  All other systems reviewed and negative  General: Affect appropriate Healthy:  appears stated age HEENT: normal Neck supple with no adenopathy JVP normal no bruits no thyromegaly Lungs clear with no wheezing and good diaphragmatic motion Heart:  S1/S2 no murmur, no rub, gallop or click PMI normal Abdomen: benighn, BS positve, no tenderness, no AAA no bruit.  No HSM or HJR Distal pulses intact with no bruits No edema Neuro non-focal Skin warm and dry No muscular weakness   Current Outpatient Prescriptions  Medication Sig Dispense Refill  . aspirin 81 MG tablet Take 81 mg by mouth daily.        . Coenzyme Q10 (COQ10) 100 MG CAPS Take 1 capsule by mouth daily.        . fexofenadine (ALLEGRA)  180 MG tablet Take 180 mg by mouth daily.        . Glucosamine-Chondroit-Vit C-Mn (GLUCOSAMINE CHONDR 1500 COMPLX PO) Take 1 tablet by mouth 3 (three) times daily.        . hyoscyamine (ANASPAZ) 0.125 MG TBDP Place 1 tablet (0.125 mg total) under the tongue every 6 (six) hours as needed.  40 tablet  2  . isometheptene-acetaminophen-dichloralphenazone (MIDRIN) 65-325-100 MG capsule Take 1 capsule by mouth every 6 (six) hours as needed for migraine.  50 capsule  5  . isosorbide mononitrate (IMDUR) 30 MG 24 hr tablet Take 1 tablet (30 mg total) by mouth daily.  90 tablet  3  . letrozole (FEMARA) 2.5 MG tablet TAKE 1 TABLET BY MOUTH ONCE DAILY  30 tablet  0  . losartan-hydrochlorothiazide (HYZAAR) 100-12.5 MG per tablet take 1 tablet by mouth once daily  30 tablet  12  . methocarbamol (ROBAXIN) 500 MG tablet Take 1 tablet (500 mg total) by mouth 3 (three) times daily as needed (muscle spasm).  90 tablet  5  . metoprolol (LOPRESSOR) 50 MG tablet Take 1 tablet (50 mg total) by mouth 2 (two) times daily.  60 tablet  5  . mometasone (NASONEX) 50 MCG/ACT nasal spray 2 sprays by Nasal route daily.        . nitroGLYCERIN (NITROSTAT) 0.4 MG SL tablet Take one tablet  By mouth SL every 5 minutes for a total of 3 doses as needed for chest pain.  25 tablet  3  . Omega-3 Fatty Acids (FISH OIL) 1200 MG CAPS 2 tabs po bid       . omeprazole (PRILOSEC) 40 MG capsule take 1 capsule by mouth once daily 30 MINUTES BEFORE THE FIRST MEAL OF THE DAY  30 capsule  6  . traMADol (ULTRAM) 50 MG tablet Take 1 tablet (50 mg total) by mouth 3 (three) times daily as needed. For pain  90 tablet  5  . travoprost, benzalkonium, (TRAVATAN) 0.004 % ophthalmic solution Place 1 drop into both eyes at bedtime.       . vitamin B-12 (CYANOCOBALAMIN) 1000 MCG tablet Take 1,000 mcg by mouth daily.        . Vitamin D, Ergocalciferol, (DRISDOL) 50000 UNITS CAPS Take 50,000 Units by mouth every 14 (fourteen) days. Every other week       No  current facility-administered medications for this visit.    Allergies  Choline fenofibrate; Simvastatin; Hydrocodone; Adhesive; Ceclor; Clarithromycin; Codeine; Doxycycline; Lisinopril; Penicillins; and Tobramycin-dexamethasone  Electrocardiogram:  11/13/11  NSR rate 64 nonspecific ST/T wave changes  Assessment and Plan

## 2012-07-15 NOTE — Assessment & Plan Note (Signed)
Cholesterol is at goal.  Continue current dose of statin and diet Rx.  No myalgias or side effects.  F/U  LFT's in 6 months. Lab Results  Component Value Date   LDLCALC 110* 04/06/2012

## 2012-07-16 DIAGNOSIS — J309 Allergic rhinitis, unspecified: Secondary | ICD-10-CM | POA: Diagnosis not present

## 2012-07-17 DIAGNOSIS — M542 Cervicalgia: Secondary | ICD-10-CM | POA: Diagnosis not present

## 2012-07-17 DIAGNOSIS — M47812 Spondylosis without myelopathy or radiculopathy, cervical region: Secondary | ICD-10-CM | POA: Diagnosis not present

## 2012-07-23 DIAGNOSIS — J309 Allergic rhinitis, unspecified: Secondary | ICD-10-CM | POA: Diagnosis not present

## 2012-07-28 ENCOUNTER — Other Ambulatory Visit (HOSPITAL_BASED_OUTPATIENT_CLINIC_OR_DEPARTMENT_OTHER): Payer: Medicare Other | Admitting: Lab

## 2012-07-28 ENCOUNTER — Telehealth: Payer: Self-pay | Admitting: Oncology

## 2012-07-28 ENCOUNTER — Ambulatory Visit (HOSPITAL_BASED_OUTPATIENT_CLINIC_OR_DEPARTMENT_OTHER): Payer: Medicare Other | Admitting: Oncology

## 2012-07-28 VITALS — BP 144/66 | HR 63 | Temp 98.0°F | Resp 20 | Ht 59.0 in | Wt 137.7 lb

## 2012-07-28 DIAGNOSIS — C50519 Malignant neoplasm of lower-outer quadrant of unspecified female breast: Secondary | ICD-10-CM | POA: Diagnosis not present

## 2012-07-28 DIAGNOSIS — R5383 Other fatigue: Secondary | ICD-10-CM

## 2012-07-28 DIAGNOSIS — Z170 Estrogen receptor positive status [ER+]: Secondary | ICD-10-CM

## 2012-07-28 DIAGNOSIS — C50912 Malignant neoplasm of unspecified site of left female breast: Secondary | ICD-10-CM

## 2012-07-28 DIAGNOSIS — M8580 Other specified disorders of bone density and structure, unspecified site: Secondary | ICD-10-CM

## 2012-07-28 DIAGNOSIS — R5381 Other malaise: Secondary | ICD-10-CM | POA: Diagnosis not present

## 2012-07-28 LAB — COMPREHENSIVE METABOLIC PANEL (CC13)
ALT: 29 U/L (ref 0–55)
AST: 22 U/L (ref 5–34)
BUN: 16.3 mg/dL (ref 7.0–26.0)
CO2: 30 mEq/L — ABNORMAL HIGH (ref 22–29)
Calcium: 9.4 mg/dL (ref 8.4–10.4)
Chloride: 95 mEq/L — ABNORMAL LOW (ref 98–107)
Creatinine: 0.8 mg/dL (ref 0.6–1.1)
Total Bilirubin: 1.27 mg/dL — ABNORMAL HIGH (ref 0.20–1.20)

## 2012-07-28 LAB — CBC WITH DIFFERENTIAL/PLATELET
BASO%: 2.1 % — ABNORMAL HIGH (ref 0.0–2.0)
Basophils Absolute: 0.1 10*3/uL (ref 0.0–0.1)
EOS%: 2.7 % (ref 0.0–7.0)
HCT: 35.9 % (ref 34.8–46.6)
HGB: 12.8 g/dL (ref 11.6–15.9)
LYMPH%: 44.3 % (ref 14.0–49.7)
MCH: 31.9 pg (ref 25.1–34.0)
MCHC: 35.6 g/dL (ref 31.5–36.0)
NEUT%: 42.1 % (ref 38.4–76.8)
Platelets: 227 10*3/uL (ref 145–400)
lymph#: 2.5 10*3/uL (ref 0.9–3.3)

## 2012-07-28 NOTE — Progress Notes (Signed)
ID: [name]   DOB: [birth year]  MR#: [id number]  WUJ#:[id number]  PCP: Michele Mcalpine, MD GYN: Ria Comment NP SU: Cicero Duck OTHER MD: Antony Blackbird, Rogelia Mire, Charlton Haws   HISTORY OF PRESENT ILLNESS: [name] is a 77 year old [place] woman referred by Dr. Tilda Burrow for evaluation and treatment in the setting of newly diagnosed breast cancer.   The patient had routine screening mammography June 2011 which was unremarkable.  Repeat screening mammography December 11, 2010, at [place], however, showed a possible abnormality in the left breast.  Additional imaging on October 18th showed an irregular, lobulated mass in the lower outer quadrant of the left breast which by ultrasound was irregular and hypoechoic.  It measured 2.5 cm by ultrasound.  The left axilla showed a benign-looking lymph node which did not appear to have changed compared to as seen in prior mammograms.   With this information, the patient underwent biopsy of the left breast mass October 25th, and this showed [phone]) an invasive mammary carcinoma with some lobular features but strongly E-cadherin positive, and so an invasive ductal carcinoma.  There was evidence of perineural invasion, although no definite evidence of angiolymphatic invasion.  The tumor had a CISH ratio of HER-2 to CEP17 signals of 1.15, indicating no amplification.  Estrogen receptor was positive at 100%, progesterone receptor was positive at 61%, and the proliferation marker was 72%.   With this information, the patient was referred to Dr. [name], and bilateral breast MRIs were obtained December 24, 2010.  This showed the mass in the left breast to measure 1.7 cm, to be solitary, to be not contacting the pectoralis, and in addition, there was no suspicious finding in the right breast, no pathologically enlarged axillary or internal mammary chain lymph nodes.   INTERVAL HISTORY: [name] returns today for followup of her left breast  carcinoma. The interval history is significant for some unusual headaches and other symptoms which led to an MRI of the neck showing significant degenerative disease, but no metastatic disease. She is saw a neurosurgeon whom she did not like and does not plan to go back to. In any case surgery was not advised, and she is hoping some physical therapy will how.  REVIEW OF SYSTEMS: Aside from the neck issue she feels forgetful, although a 3 name recall test today was just perfect. She still has a little bit of numbness in her fingertips and toe tips. She describes herself as mildly fatigued. She is gaining some weight. Otherwise a detailed review of systems today was entirely stable.  PAST MEDICAL HISTORY: Past Medical History  Diagnosis Date  . Unspecified glaucoma(365.9)   . Allergic rhinitis, cause unspecified   . Unspecified asthma(493.90)   . Unspecified essential hypertension   . Other and unspecified hyperlipidemia   . Gastritis   . Abdominal pain, left lower quadrant   . Diverticulosis of colon (without mention of hemorrhage)   . Irritable bowel syndrome   . Colonic polyp 04-27-2009    tubular adenoma  . Headache(784.0)   . Dizziness   . Breast cancer, Left 12/20/2010  . Gallstones   . CAD (coronary artery disease)     a. 01/19/2010 s/p CABG x 3, lima->lad, vg->diag, vg->om1;  b. 06/2011 :[facility]i MV: EF 84%, No ischemia.  . Bronchitis     hx of  . Pneumonia   . Hiatal hernia   . Fibromyalgia   . Esophageal reflux     hiatal hernia  Coronary artery disease status post open  heart surgery under Of[facility] Neas November 2011, with bypasses from the left internal mammary artery to the left anterior descending, and then reverse saphenous vein graft to the diagonal coronary artery.  The patient had previously had stents placed by Dr. Eden Emms in 2007.  Other problems include a history of reflux esophagitis, hypertension, hyperlipidemia, rosacea and gallstones.  PAST SURGICAL HISTORY: Past  Surgical History  Procedure Laterality Date  . Coronary angioplasty with stent placement      Stent 2007  . Open heart surgery      01/19/2010  . Coronary artery bypass graft    . Eye surgery      bilateral cataract removal,  . Breast lumpectomy  02/06/11    left    FAMILY HISTORY Family History  Problem Relation Age of Onset  . Heart disease Brother   . Cancer Brother     gland cancer  . Diabetes Sister   . Breast cancer Sister   . Stroke Mother   . Stroke Father   The patient's father died at the age of 20 following a stroke.  The patient's mother died at 29 with a history of Alzheimer disease.  The patient had 2 sisters, one of whom developed breast cancer at the age of 27 and survives.  The patient has a brother who apparently had lymphoma.  He is also a survivor.  GYNECOLOGIC HISTORY: Menarche age 21.  Menopause in the mid 1990s.  She never used hormone replacement.  She is a GX, P2.  SOCIAL HISTORY: [name] used to teach preschool.  She is now  [employer] retired.  Her husband, Metta Koranda,  has worked as a Curator and is still very active in his shop.   Daughter, [name], lives in [place] and is a home school mom.  Daughter, [name],  lives in [place] and works as a Geophysicist/field seismologist for the [employer].  The patient attends the [employer] in [place].   ADVANCED DIRECTIVES: Not in place  HEALTH MAINTENANCE: History  Substance Use Topics  . Smoking status: Never Smoker   . Smokeless tobacco: Never Used  . Alcohol Use: No     Colonoscopy: D. Patterson  PAP: 2011  Bone density: 2012/[facility]  Lipid panel:  Allergies  Allergen Reactions  . Choline Fenofibrate Other (See Comments)    REACTION: pt states INTOL to Trilipix w/ "thigh burning"  . Simvastatin Other (See Comments)    REACTION: pt states INTOL to STATINS \\T \ refuses to restart  . Hydrocodone     Nightmare after taking cough syrup w/hydrocodone  . Adhesive (Tape)  Rash  . Ceclor (Cefaclor) Rash  . Clarithromycin Rash  . Codeine Nausea Only  . Doxycycline Rash  . Lisinopril Cough    Developed ACE cough...  . Penicillins Itching and Rash    At injection site  . Tobramycin-Dexamethasone Rash    Current Outpatient Prescriptions  Medication Sig Dispense Refill  . aspirin 81 MG tablet Take 81 mg by mouth daily.        . Coenzyme Q10 (COQ10) 100 MG CAPS Take 1 capsule by mouth daily.        . fexofenadine (ALLEGRA) 180 MG tablet Take 180 mg by mouth daily.        . Glucosamine-Chondroit-Vit C-Mn (GLUCOSAMINE CHONDR 1500 COMPLX PO) Take 1 tablet by mouth 3 (three) times daily.        . hyoscyamine (ANASPAZ) 0.125 MG TBDP Place 1 tablet (0.125 mg  total) under the tongue every 6 (six) hours as needed.  40 tablet  2  . isosorbide mononitrate (IMDUR) 30 MG 24 hr tablet Take 1 tablet (30 mg total) by mouth daily.  90 tablet  3  . letrozole (FEMARA) 2.5 MG tablet TAKE 1 TABLET BY MOUTH ONCE DAILY  30 tablet  0  . losartan-hydrochlorothiazide (HYZAAR) 100-12.5 MG per tablet take 1 tablet by mouth once daily  30 tablet  12  . methocarbamol (ROBAXIN) 500 MG tablet Take 1 tablet (500 mg total) by mouth 3 (three) times daily as needed (muscle spasm).  90 tablet  5  . metoprolol (LOPRESSOR) 50 MG tablet Take 1 tablet (50 mg total) by mouth 2 (two) times daily.  60 tablet  5  . mometasone (NASONEX) 50 MCG/ACT nasal spray 2 sprays by Nasal route daily.        . nitroGLYCERIN (NITROSTAT) 0.4 MG SL tablet Take one tablet  By mouth SL every 5 minutes for a total of 3 doses as needed for chest pain.  25 tablet  4  . Omega-3 Fatty Acids (FISH OIL) 1200 MG CAPS 2 tabs po bid       . omeprazole (PRILOSEC) 40 MG capsule take 1 capsule by mouth once daily 30 MINUTES BEFORE THE FIRST MEAL OF THE DAY  30 capsule  6  . traMADol (ULTRAM) 50 MG tablet Take 1 tablet (50 mg total) by mouth 3 (three) times daily as needed. For pain  90 tablet  5  . travoprost, benzalkonium, (TRAVATAN)  0.004 % ophthalmic solution Place 1 drop into both eyes at bedtime.       . vitamin B-12 (CYANOCOBALAMIN) 1000 MCG tablet Take 1,000 mcg by mouth daily.        . Vitamin D, Ergocalciferol, (DRISDOL) 50000 UNITS CAPS Take 50,000 Units by mouth every 14 (fourteen) days. Every other week       No current facility-administered medications for this visit.    OBJECTIVE: Middle-aged white woman in no acute distress Filed Vitals:   07/28/12 0953  BP: 144/66  Pulse: 63  Temp: 98 F (36.7 C)  Resp: 20     Body mass index is 27.8 kg/(m^2).    ECOG FS: 1 Filed Weights   07/28/12 0953  Weight: 137 lb 11.2 oz (62.46 kg)   Sclerae unicteric Oropharynx clear No  cervical or supraclavicular  adenopathy Lungs no rales or rhonchi Heart regular rate and rhythm Abd benign, soft, nontender, nondistended. Positive bowel sounds MSK no focal spinal tenderness No peripheral edema Neuro: nonfocal,  well oriented, friendly affect  Breasts: The right breast is unremarkable. The left breast is status post lumpectomy and radiation. There is no evidence of local recurrence. The left axilla is benign.  LAB RESULTS: Lab Results  Component Value Date   WBC 5.7 07/28/2012   NEUTROABS 2.4 07/28/2012   HGB 12.8 07/28/2012   HCT 35.9 07/28/2012   MCV 89.4 07/28/2012   PLT 227 07/28/2012      Chemistry      Component Value Date/Time   NA 134* 07/28/2012 0941   NA 130* 06/17/2012 0550   K 4.3 07/28/2012 0941   K 4.6 06/17/2012 0550   CL 95* 07/28/2012 0941   CL 92* 06/17/2012 0550   CO2 30* 07/28/2012 0941   CO2 24 06/17/2012 0550   BUN 16.3 07/28/2012 0941   BUN 12 06/17/2012 0550   CREATININE 0.8 07/28/2012 0941   CREATININE 0.48* 06/17/2012 0550  Component Value Date/Time   CALCIUM 9.4 07/28/2012 0941   CALCIUM 10.1 06/17/2012 0550   ALKPHOS 81 07/28/2012 0941   ALKPHOS 78 06/16/2012 1530   AST 22 07/28/2012 0941   AST 22 06/16/2012 1530   ALT 29 07/28/2012 0941   ALT 19 06/16/2012 1530   BILITOT 1.27* 07/28/2012 0941    BILITOT 0.7 06/16/2012 1530       Lab Results  Component Value Date   LABCA2 20 11/07/2011    STUDIES: Mr Cervical Spine W Wo Contrast  07/07/2012   *RADIOLOGY REPORT*  Clinical Data: Neck pain and left arm tingling.  MRI CERVICAL SPINE WITHOUT AND WITH CONTRAST  Technique:  Multiplanar and multiecho pulse sequences of the cervical spine, to include the craniocervical junction and cervicothoracic junction, were obtained according to standard protocol without and with intravenous contrast.  Contrast: 12mL MULTIHANCE GADOBENATE DIMEGLUMINE 529 MG/ML IV SOLN  Comparison: Cervical spine radiographs 06/17/2012.  Findings: Normal signal is present in the cervical upper thoracic spinal cord to the lowest imaged level, T3-4.  The craniocervical junction is within normal limits.  The visualized intracranial contents are normal. Marrow signal and vertebral body heights are normal.  Slight degenerative anterolisthesis is present at C3-4. Alignment is otherwise anatomic.  Flow is present in the major vascular structures of the neck.  The visualized soft tissues are unremarkable.  C2-3:  Asymmetric left-sided facet hypertrophy is present.  There is no significant stenosis.  C3-4: A broad-based disc osteophyte complex partially effaces the ventral CSF.  Asymmetric right-sided facet hypertrophy and uncovertebral disease results in mild right foraminal stenosis.  C4-5:  A mild broad-based disc osteophyte complex is present. Facet hypertrophy is noted bilaterally.  There is no significant foraminal stenosis.  C5-6:  A left paracentral disc protrusion potentially contacts the ventral surface of the cord.  Uncovertebral facet disease is present bilaterally.  The foramina are patent.  C6-7:  A broad-based disc osteophyte complex is present.  There is partial effacement of the ventral CSF.  Asymmetric left-sided uncovertebral spurring is evident.  Mild left foraminal narrowing is present.  C7-T1:  Asymmetric left-sided facet  hypertrophy results in mild left foraminal narrowing.  The central canal and right foramen are patent.  IMPRESSION:  1.  Mild right foraminal stenosis at C3-4 secondary to asymmetric right-sided facet hypertrophy and uncovertebral disease. 2.  Facet hypertrophy and disc osteophyte complexes at C4-5 with mild central canal narrowing.  3.  Mild to moderate left central canal stenosis C5-6 without significant foraminal disease. 4.  Mild central and left foraminal stenosis at C6-7 secondary to a broad-based disc osteophyte complex and uncovertebral disease. 5.  Mild left foraminal narrowing at C7-C1 secondary to facet disease.   Original Report Authenticated By: Marin Roberts, M.D.   ASSESSMENT: 77 y.o. [place] woman   (1)  status post left lumpectomy and sentinel lymph node biopsy December of 2012 for a T2 N0, stage IIA invasive ductal carcinoma, grade 3, HER-2 not amplified, strongly estrogen and progesterone receptor positive with an elevated proliferation marker at 72%;   (2) Oncotype recurrence score of 21, in the intermediate range, indicating a distant disease recurrence rate of 13% within 10 years if the patient's only systemic adjuvant therapy was tamoxifen for 5 years.   (2)  She completed radiation  March of 2011 and started letrozole at that time.  PLAN: The plan is to continue letrozole to March of 2016. We're going to obtain a bone density with her October mammogram at [facility]  this year. She will see [place] again in one year. She knows to call for any problems that may develop before the next visit.

## 2012-07-29 ENCOUNTER — Ambulatory Visit: Payer: Medicare Other | Attending: Neurosurgery | Admitting: Physical Therapy

## 2012-07-29 DIAGNOSIS — IMO0001 Reserved for inherently not codable concepts without codable children: Secondary | ICD-10-CM | POA: Insufficient documentation

## 2012-07-29 DIAGNOSIS — M542 Cervicalgia: Secondary | ICD-10-CM | POA: Insufficient documentation

## 2012-07-31 ENCOUNTER — Telehealth: Payer: Self-pay | Admitting: Cardiovascular Disease

## 2012-07-31 DIAGNOSIS — J309 Allergic rhinitis, unspecified: Secondary | ICD-10-CM | POA: Diagnosis not present

## 2012-07-31 MED ORDER — LOSARTAN POTASSIUM-HCTZ 100-12.5 MG PO TABS: 1.0000 | ORAL_TABLET | Freq: Every day | ORAL | Status: DC

## 2012-07-31 NOTE — Telephone Encounter (Signed)
Fax Received. Refill Completed. Dishon Kehoe Chowoe (R.M.A)   

## 2012-07-31 NOTE — Telephone Encounter (Signed)
New Problem:    Patient called in needing a refill of her losartan-hydrochlorothiazide (HYZAAR) 100-12.5 MG per tablet.  Patient is out of her medication.  Please call back, patient would like to switch her pharmacy.

## 2012-07-31 NOTE — Telephone Encounter (Signed)
Follow up  Pt is calling back about her medication. She note below from [facility].

## 2012-08-03 ENCOUNTER — Ambulatory Visit: Payer: Medicare Other | Admitting: Physical Therapy

## 2012-08-03 DIAGNOSIS — M542 Cervicalgia: Secondary | ICD-10-CM | POA: Diagnosis not present

## 2012-08-03 DIAGNOSIS — IMO0001 Reserved for inherently not codable concepts without codable children: Secondary | ICD-10-CM | POA: Diagnosis not present

## 2012-08-05 ENCOUNTER — Encounter: Payer: Self-pay | Admitting: *Deleted

## 2012-08-06 ENCOUNTER — Ambulatory Visit: Payer: Medicare Other | Admitting: Physical Therapy

## 2012-08-06 DIAGNOSIS — J309 Allergic rhinitis, unspecified: Secondary | ICD-10-CM | POA: Diagnosis not present

## 2012-08-06 DIAGNOSIS — M542 Cervicalgia: Secondary | ICD-10-CM | POA: Diagnosis not present

## 2012-08-06 DIAGNOSIS — IMO0001 Reserved for inherently not codable concepts without codable children: Secondary | ICD-10-CM | POA: Diagnosis not present

## 2012-08-10 ENCOUNTER — Ambulatory Visit: Payer: Medicare Other | Admitting: Physical Therapy

## 2012-08-10 DIAGNOSIS — M542 Cervicalgia: Secondary | ICD-10-CM | POA: Diagnosis not present

## 2012-08-10 DIAGNOSIS — IMO0001 Reserved for inherently not codable concepts without codable children: Secondary | ICD-10-CM | POA: Diagnosis not present

## 2012-08-11 DIAGNOSIS — M5030 Other cervical disc degeneration, unspecified cervical region: Secondary | ICD-10-CM | POA: Diagnosis not present

## 2012-08-11 DIAGNOSIS — M9981 Other biomechanical lesions of cervical region: Secondary | ICD-10-CM | POA: Diagnosis not present

## 2012-08-12 ENCOUNTER — Ambulatory Visit: Payer: Medicare Other | Admitting: Physical Therapy

## 2012-08-12 DIAGNOSIS — IMO0001 Reserved for inherently not codable concepts without codable children: Secondary | ICD-10-CM | POA: Diagnosis not present

## 2012-08-12 DIAGNOSIS — M542 Cervicalgia: Secondary | ICD-10-CM | POA: Diagnosis not present

## 2012-08-12 DIAGNOSIS — J309 Allergic rhinitis, unspecified: Secondary | ICD-10-CM | POA: Diagnosis not present

## 2012-08-18 ENCOUNTER — Encounter: Payer: Self-pay | Admitting: Nurse Practitioner

## 2012-08-18 ENCOUNTER — Ambulatory Visit: Payer: Medicare Other | Admitting: Rehabilitation

## 2012-08-18 ENCOUNTER — Ambulatory Visit (INDEPENDENT_AMBULATORY_CARE_PROVIDER_SITE_OTHER): Payer: Medicare Other | Admitting: Nurse Practitioner

## 2012-08-18 VITALS — BP 128/48 | HR 62 | Resp 12 | Ht 59.0 in | Wt 136.0 lb

## 2012-08-18 DIAGNOSIS — Z124 Encounter for screening for malignant neoplasm of cervix: Secondary | ICD-10-CM

## 2012-08-18 DIAGNOSIS — IMO0001 Reserved for inherently not codable concepts without codable children: Secondary | ICD-10-CM | POA: Diagnosis not present

## 2012-08-18 DIAGNOSIS — Z01419 Encounter for gynecological examination (general) (routine) without abnormal findings: Secondary | ICD-10-CM | POA: Diagnosis not present

## 2012-08-18 DIAGNOSIS — E559 Vitamin D deficiency, unspecified: Secondary | ICD-10-CM

## 2012-08-18 DIAGNOSIS — M542 Cervicalgia: Secondary | ICD-10-CM | POA: Diagnosis not present

## 2012-08-18 MED ORDER — VITAMIN D (ERGOCALCIFEROL) 1.25 MG (50000 UNIT) PO CAPS: 50000.0000 [IU] | ORAL_CAPSULE | ORAL | Status: DC

## 2012-08-18 NOTE — Progress Notes (Signed)
77 y.o. [id number] Married Caucasian Fe here for annual exam.  No new health concerns . Remains on Femara - now at 1 year mark.  She continues to have vaginal dryness and EVOO does help some.  She is very Thankful for each day!  She and husband are still gardening and she just canned a bushel of green beans.  No LMP recorded. Patient is postmenopausal.          Sexually active: yes  The current method of family planning is post menopausal status.    Exercising: yes  walking Smoker:  no  Health Maintenance: Pap:  08/15/2011  negative MMG:  11/2011 has been scheduled 11/2012 Colonoscopy:  04/2009 polyp recheck in 5 years BMD:   11/2010 has been scheduled 11/2012 TDaP:  PCP maintains tetanus.  Labs: PCP maintains lab (blood) work and urine.    reports that she has never smoked. She has never used smokeless tobacco. She reports that she does not drink alcohol or use illicit drugs.  Past Medical History  Diagnosis Date  . Unspecified glaucoma(365.9)   . Allergic rhinitis, cause unspecified   . Unspecified asthma(493.90)   . Unspecified essential hypertension   . Other and unspecified hyperlipidemia   . Gastritis   . Abdominal pain, left lower quadrant   . Diverticulosis of colon (without mention of hemorrhage)   . Irritable bowel syndrome   . Colonic polyp 04-27-2009    tubular adenoma  . Headache(784.0)   . Dizziness   . Breast cancer, Left 12/20/2010  . Gallstones   . CAD (coronary artery disease)     a. 01/19/2010 s/p CABG x 3, lima->lad, vg->diag, vg->om1;  b. 06/2011 :Lexi MV: EF 84%, No ischemia.  . Bronchitis     hx of  . Pneumonia   . Hiatal hernia   . Fibromyalgia   . Esophageal reflux     hiatal hernia  . Atrophic vaginitis   . GERD (gastroesophageal reflux disease)   . Hypercholesterolemia   . Breast cancer   . Glaucoma     Past Surgical History  Procedure Laterality Date  . Coronary angioplasty with stent placement      Stent 2007  . Open heart surgery     01/19/2010  . Coronary artery bypass graft    . Eye surgery      bilateral cataract removal,  . Breast lumpectomy  02/06/11    left  . Cardiac stints    . Glaucoma repair      Current Outpatient Prescriptions  Medication Sig Dispense Refill  . aspirin 81 MG tablet Take 81 mg by mouth daily.        . Coenzyme Q10 (COQ10) 100 MG CAPS Take 1 capsule by mouth daily.        . fexofenadine (ALLEGRA) 180 MG tablet Take 180 mg by mouth daily.        . Glucosamine-Chondroit-Vit C-Mn (GLUCOSAMINE CHONDR 1500 COMPLX PO) Take 1 tablet by mouth 3 (three) times daily.        . hyoscyamine (ANASPAZ) 0.125 MG TBDP Place 1 tablet (0.125 mg total) under the tongue every 6 (six) hours as needed.  40 tablet  2  . isosorbide mononitrate (IMDUR) 30 MG 24 hr tablet Take 1 tablet (30 mg total) by mouth daily.  90 tablet  3  . letrozole (FEMARA) 2.5 MG tablet TAKE 1 TABLET BY MOUTH ONCE DAILY  30 tablet  0  . losartan-hydrochlorothiazide (HYZAAR) 100-12.5 MG per tablet Take 1 tablet  by mouth daily.  30 tablet  6  . methocarbamol (ROBAXIN) 500 MG tablet Take 1 tablet (500 mg total) by mouth 3 (three) times daily as needed (muscle spasm).  90 tablet  5  . metoprolol (LOPRESSOR) 50 MG tablet Take 1 tablet (50 mg total) by mouth 2 (two) times daily.  60 tablet  5  . mometasone (NASONEX) 50 MCG/ACT nasal spray 2 sprays by Nasal route daily.        . nitroGLYCERIN (NITROSTAT) 0.4 MG SL tablet Take one tablet  By mouth SL every 5 minutes for a total of 3 doses as needed for chest pain.  25 tablet  4  . Omega-3 Fatty Acids (FISH OIL) 1200 MG CAPS 2 tabs po bid       . omeprazole (PRILOSEC) 40 MG capsule take 1 capsule by mouth once daily 30 MINUTES BEFORE THE FIRST MEAL OF THE DAY  30 capsule  6  . traMADol (ULTRAM) 50 MG tablet Take 1 tablet (50 mg total) by mouth 3 (three) times daily as needed. For pain  90 tablet  5  . travoprost, benzalkonium, (TRAVATAN) 0.004 % ophthalmic solution Place 1 drop into both eyes at  bedtime.       . vitamin B-12 (CYANOCOBALAMIN) 1000 MCG tablet Take 1,000 mcg by mouth daily.        . Vitamin D, Ergocalciferol, (DRISDOL) 50000 UNITS CAPS Take 50,000 Units by mouth every 14 (fourteen) days. Every other week       No current facility-administered medications for this visit.    Family History  Problem Relation Age of Onset  . Heart disease Brother   . Cancer Brother     gland cancer  . Diabetes Sister   . Breast cancer Sister   . Stroke Mother   . Stroke Father     ROS:  Pertinent items are noted in HPI.  Otherwise, a comprehensive ROS was negative.  Exam:   BP 128/48  Pulse 62  Resp 12  Ht 4\' 11"  (1.499 m)  Wt 136 lb (61.689 kg)  BMI 27.45 kg/m2 Height: 4\' 11"  (149.9 cm)  Ht Readings from Last 3 Encounters:  08/18/12 4\' 11"  (1.499 m)  07/28/12 4\' 11"  (1.499 m)  07/15/12 4\' 11"  (1.499 m)    General appearance: alert, cooperative and appears stated age Head: Normocephalic, without obvious abnormality, atraumatic Neck: no adenopathy, supple, symmetrical, trachea midline and thyroid normal to inspection and palpation Lungs: clear to auscultation bilaterally Breasts: right breast without mass, left breast with surgical and radiation changes.  Inverted nipple on left. Heart: regular rate and rhythm Abdomen: soft, non-tender; no masses,  no organomegaly Extremities: extremities normal, atraumatic, no cyanosis or edema Skin: Skin color, texture, turgor normal. No rashes or lesions Lymph nodes: Cervical, supraclavicular, and axillary nodes normal. No abnormal inguinal nodes palpated Neurologic: Grossly normal   Pelvic: External genitalia:  no lesions              Urethra:  normal appearing urethra with no masses, tenderness or lesions              Bartholin's and Skene's: normal                 Vagina: atrophic appearing vagina with pale color and no discharge, no lesions              Cervix: anteverted              Pap taken: no Bimanual Exam:  Uterus:   normal size, contour, position, consistency, mobility, non-tender              Adnexa: no mass, fullness, tenderness               Rectovaginal: Confirms               Anus:  normal sphincter tone, no lesions  A:  Well Woman with normal exam  S/P left breast cancer with radiation treatment after lumpectomy 01/2011  History of Vit D deficiency  History of CABG 3 vessel 12/2009  P:   Pap smear as per guidelines   Mammogram as scheduled  Recheck Vit D and follow protocol 0 currently at every other week  counseled on breast self exam, menopause, osteoporosis, adequate intake of  calcium and vitamin D, diet and exercise, Kegel's exercises return annually or prn  An After Visit Summary was printed and given to the patient.

## 2012-08-18 NOTE — Patient Instructions (Addendum)

## 2012-08-20 ENCOUNTER — Ambulatory Visit: Payer: Medicare Other | Admitting: Rehabilitation

## 2012-08-20 DIAGNOSIS — M542 Cervicalgia: Secondary | ICD-10-CM | POA: Diagnosis not present

## 2012-08-20 DIAGNOSIS — J309 Allergic rhinitis, unspecified: Secondary | ICD-10-CM | POA: Diagnosis not present

## 2012-08-20 DIAGNOSIS — IMO0001 Reserved for inherently not codable concepts without codable children: Secondary | ICD-10-CM | POA: Diagnosis not present

## 2012-08-20 NOTE — Progress Notes (Signed)
Encounter reviewed by Dr. Ammiel Guiney Silva.  

## 2012-08-25 ENCOUNTER — Ambulatory Visit: Payer: Medicare Other | Attending: Neurosurgery | Admitting: Physical Therapy

## 2012-08-25 ENCOUNTER — Telehealth: Payer: Self-pay | Admitting: *Deleted

## 2012-08-25 DIAGNOSIS — J309 Allergic rhinitis, unspecified: Secondary | ICD-10-CM | POA: Diagnosis not present

## 2012-08-25 DIAGNOSIS — M542 Cervicalgia: Secondary | ICD-10-CM | POA: Insufficient documentation

## 2012-08-25 DIAGNOSIS — IMO0001 Reserved for inherently not codable concepts without codable children: Secondary | ICD-10-CM | POA: Insufficient documentation

## 2012-08-25 NOTE — Telephone Encounter (Signed)
Message copied by Osie Bond on Tue Aug 25, 2012 10:04 AM ------      Message from: Ria Comment R      Created: Tue Aug 25, 2012  8:35 AM       Let pt know her results and follow protocol. ------

## 2012-08-25 NOTE — Telephone Encounter (Signed)
LVM for pt to return my call in regards to lab results.  

## 2012-08-27 ENCOUNTER — Ambulatory Visit: Payer: Medicare Other | Admitting: Rehabilitation

## 2012-08-27 DIAGNOSIS — M5030 Other cervical disc degeneration, unspecified cervical region: Secondary | ICD-10-CM | POA: Diagnosis not present

## 2012-08-27 DIAGNOSIS — M9981 Other biomechanical lesions of cervical region: Secondary | ICD-10-CM | POA: Diagnosis not present

## 2012-09-01 ENCOUNTER — Telehealth: Payer: Self-pay | Admitting: *Deleted

## 2012-09-01 ENCOUNTER — Ambulatory Visit: Payer: Medicare Other | Admitting: Physical Therapy

## 2012-09-01 DIAGNOSIS — J309 Allergic rhinitis, unspecified: Secondary | ICD-10-CM | POA: Diagnosis not present

## 2012-09-01 NOTE — Telephone Encounter (Signed)
Left message with husband to return my call for lab results.

## 2012-09-01 NOTE — Telephone Encounter (Signed)
Message copied by Osie Bond on Tue Sep 01, 2012  8:30 AM ------      Message from: Ria Comment R      Created: Tue Aug 25, 2012  8:35 AM       Let pt know her results and follow protocol. ------

## 2012-09-03 ENCOUNTER — Ambulatory Visit: Payer: Medicare Other | Admitting: Physical Therapy

## 2012-09-08 ENCOUNTER — Ambulatory Visit: Payer: Medicare Other | Admitting: Physical Therapy

## 2012-09-08 ENCOUNTER — Telehealth: Payer: Self-pay | Admitting: *Deleted

## 2012-09-08 DIAGNOSIS — M5030 Other cervical disc degeneration, unspecified cervical region: Secondary | ICD-10-CM | POA: Diagnosis not present

## 2012-09-08 DIAGNOSIS — M9981 Other biomechanical lesions of cervical region: Secondary | ICD-10-CM | POA: Diagnosis not present

## 2012-09-08 NOTE — Telephone Encounter (Signed)
Message copied by Osie Bond on Tue Sep 08, 2012  9:33 AM ------      Message from: Ria Comment R      Created: Tue Aug 25, 2012  8:35 AM       Let pt know her results and follow protocol. ------

## 2012-09-08 NOTE — Telephone Encounter (Signed)
Pt is aware of vitamin D lab results and is agreeable to Vitamin D 600-800 IU 1 po qd (otc).

## 2012-09-10 ENCOUNTER — Ambulatory Visit: Payer: Medicare Other

## 2012-09-11 DIAGNOSIS — J309 Allergic rhinitis, unspecified: Secondary | ICD-10-CM | POA: Diagnosis not present

## 2012-09-15 ENCOUNTER — Ambulatory Visit: Payer: Medicare Other | Admitting: Physical Therapy

## 2012-09-17 ENCOUNTER — Ambulatory Visit: Payer: Medicare Other | Admitting: Physical Therapy

## 2012-09-17 DIAGNOSIS — J309 Allergic rhinitis, unspecified: Secondary | ICD-10-CM | POA: Diagnosis not present

## 2012-09-22 ENCOUNTER — Ambulatory Visit: Payer: Medicare Other | Admitting: Physical Therapy

## 2012-09-22 DIAGNOSIS — J309 Allergic rhinitis, unspecified: Secondary | ICD-10-CM | POA: Diagnosis not present

## 2012-09-23 DIAGNOSIS — M5030 Other cervical disc degeneration, unspecified cervical region: Secondary | ICD-10-CM | POA: Diagnosis not present

## 2012-09-23 DIAGNOSIS — M9981 Other biomechanical lesions of cervical region: Secondary | ICD-10-CM | POA: Diagnosis not present

## 2012-09-25 ENCOUNTER — Ambulatory Visit: Payer: Medicare Other | Attending: Neurosurgery | Admitting: Physical Therapy

## 2012-09-25 ENCOUNTER — Other Ambulatory Visit: Payer: Self-pay | Admitting: *Deleted

## 2012-09-25 DIAGNOSIS — M542 Cervicalgia: Secondary | ICD-10-CM | POA: Diagnosis not present

## 2012-09-25 DIAGNOSIS — IMO0001 Reserved for inherently not codable concepts without codable children: Secondary | ICD-10-CM | POA: Insufficient documentation

## 2012-09-25 MED ORDER — METOPROLOL TARTRATE 50 MG PO TABS: 50.0000 mg | ORAL_TABLET | Freq: Two times a day (BID) | ORAL | Status: DC

## 2012-09-29 ENCOUNTER — Encounter: Payer: Self-pay | Admitting: Pulmonary Disease

## 2012-09-29 ENCOUNTER — Ambulatory Visit: Payer: Medicare Other | Admitting: Physical Therapy

## 2012-09-29 ENCOUNTER — Ambulatory Visit (INDEPENDENT_AMBULATORY_CARE_PROVIDER_SITE_OTHER): Payer: Medicare Other | Admitting: Pulmonary Disease

## 2012-09-29 VITALS — BP 134/60 | HR 66 | Temp 98.4°F | Ht 60.0 in | Wt 137.4 lb

## 2012-09-29 DIAGNOSIS — I2510 Atherosclerotic heart disease of native coronary artery without angina pectoris: Secondary | ICD-10-CM | POA: Diagnosis not present

## 2012-09-29 DIAGNOSIS — I10 Essential (primary) hypertension: Secondary | ICD-10-CM

## 2012-09-29 DIAGNOSIS — M1990 Unspecified osteoarthritis, unspecified site: Secondary | ICD-10-CM | POA: Diagnosis not present

## 2012-09-29 DIAGNOSIS — C50512 Malignant neoplasm of lower-outer quadrant of left female breast: Secondary | ICD-10-CM

## 2012-09-29 DIAGNOSIS — M542 Cervicalgia: Secondary | ICD-10-CM | POA: Diagnosis not present

## 2012-09-29 DIAGNOSIS — J45909 Unspecified asthma, uncomplicated: Secondary | ICD-10-CM

## 2012-09-29 DIAGNOSIS — E785 Hyperlipidemia, unspecified: Secondary | ICD-10-CM

## 2012-09-29 DIAGNOSIS — IMO0001 Reserved for inherently not codable concepts without codable children: Secondary | ICD-10-CM | POA: Diagnosis not present

## 2012-09-29 DIAGNOSIS — K219 Gastro-esophageal reflux disease without esophagitis: Secondary | ICD-10-CM

## 2012-09-29 DIAGNOSIS — K5730 Diverticulosis of large intestine without perforation or abscess without bleeding: Secondary | ICD-10-CM

## 2012-09-29 NOTE — Patient Instructions (Addendum)
Today we updated your med list in our EPIC system...    Continue your current medications the same...  Let me know if you would like a prescription for a muscle relaxer...  We will refer you to a Rheumatologist for their opinion on your situation...  Call for any questions...  Let's plan a follow up visit in 4-35mo, sooner if needed for problems.Marland KitchenMarland Kitchen

## 2012-09-29 NOTE — Progress Notes (Signed)
Subjective:    Patient ID: [name], female    DOB: 1935-11-07, 77 y.o.   MRN: [id number]  HPI 77 y/o WF here for a follow up visit... she has multiple medical problems including AR & Asthma;  HBP;  CAD followed by [name] & s/p 3 vessel CABG 11/11 by [name];  Hyperlipidemia followed in the [facility];  GERD/ IBS/ Divertics;  Breast Cancer diagnosed 12/12;  Fibromyalgia;  Chr HAs & dizziness...  ~  April 02, 2011:  37mo ROV & [name] has had another event filled interval> this time w/ mammogram showing a left breast nodule & surg (partial mastectomy & sentinel node bx) by [name] 12/12 that proved to be an invasive ductal carcinoma, 2.3cm size, w/ neg lymphovasc invasion, clear margins (but close at 1mm), & neg nodes;  subseq XRT by [name] (had her 11th treatment today); and Oncology eval by [name] who plans hormone Rx later (no chemo she says)... She is tolerating treatment well & spirits are up, [name] had to aspirate a seroma & it is now resolved...    Pat had her usual autumn URI, AB exac in Nov RX w/ ZPak, Pred, Mucinex, Hydromet but reacted to the hydrocodone & prefers OTC non-narcotic cough suppression & we discussed MUCINEX DM & DELSYM (offered Tussionex if ineffective);  She had the Flu vaccine 10/12...     She denies CP palpit, dizzy, syncope, incr SOB, edema, etc; Cardiac stable & she is thrilled w/ that...    She continues f/u in the Lipid Clinic & has been titrated up to Cres10mg  M&F, plus 5mg  on Wednes; last FLP 10/12 was the best yet w/ TChol 167, TG 203, HDL 54, LDL 88...   ~  October 01, 2011:  37mo ROV & [name] persists w/ mult somatic complaints> eg.cramp under left breast ("knots up", thinks it's a hernia); intermittent right arm numbness & in the middle 3 fingers; she has chr fibromyalgia & uses Tramadol/ Tylenol as needed...    She saw [name] 7/13 for her allergies> on allergy shots weekly, Xopenex inhaler as needed, Allegra prn, Nasonex daily and doing well...  She saw Cards 6/13 w/ known CAD, s/p CABG & intermittent CWP likely related to her FM; neg stress test- no ischemia, good LVF; she is walking some & notes "pressure" on hills...    She had f/u [name] 6/13 & he wondered if her systemic symptoms might not be related to Femara Rx...    We reviewed prob list, meds, xrays and labs> see below for updates >> requests Shingles vaccine Rx (lost prev Rx)... CXR 5/13 showed s/p CABG, clear lungs, NAD...  LABS 6/13:  Chems- ok;  CBC- wnl;  Ca27.29=20...  ~  March 31, 2012:  37mo ROV & [name] admits that she's not feeling well today- c/o occipHA w/ band-like extension around her head, feels funny- sl dizzy & light headed, occuring daily; she has Tramadol for prn use but she says she doesn't like taking it- prefers Midrin (refilled), offered Neuro/HA referral & she will consider it...     AR/ Asthma>  She stopped the Symbicort as well & not on any inhaled meds at present; denies cough, sput, ch in dyspnea, etc.    HBP>  controlled on Metop50Bid & Hyzaar100-12.5 w/ BP=144/72 today; denies visual changes, CP, palipit, dizziness, syncope, dyspnea, edema, etc...     CAD>  CP 11/11=> cath w/ in-stent restenosis, then CABG x3 by [name]; re-hospitalized 6/12 by Cards for CP> cath showed severe 3 vessel CAD  w/ 3/3 grafts patent & norm LVF w/ EF= 65-70%; Myoview 5/13 showed no ischemia & EF>80%; no change in meds- on Imdur30; & [name] noted that med rx is lim by side effects.    Hyperlipid>  followed in the [facility] & FLP reviewed on Cres10- 2d per week but she was unable to tolerate even this low dose; off all meds on diet alone...    GI- GERD, Divertics> on Omep40; followed by [name] & seen 9/13 f/u for ?diverticulitis- improved after Cipro/Flagyl but CTAbd was neg x diverticulosis; he rec high fiber diet & Citrucel...    Hx left breast cancer> she had f/u Oncology 12/13- back on Femara & tol ok; last mammogram 10/13 was ok as well...    Others>  she has persistant  FM symptoms and chr HAs, uses Vicodin Prn... We reviewed prob list, meds, xrays and labs> see below for updates >> she had the 2013 Flu vaccine 10/13...  EKG 9/13 showed NSR, rate64, NSSTTWA, NAD...  CXR 10/13 showed heart at upper lim of norm, prior CABG, mild hyperinflation, clear, NAD... Abd Ultrasound 10/13 by [name] for eval CP showed> norm GB, liver, kidneys, & Ao- neg sonar... LABS 10-12/13 showed Chems- wnl;  CBC- wnl LABS 2/14:  FLP- ok x LDL=110;  TSH=1.33   ~  Jul 03, 2012:  798mo ROV & post hospital visit> [facility] 4/22-23/14 after she presented w/ a pill (B-complex vit) aspiration and wheezing, SOB, asthma exac; she was able to cough it all out & did not require bronchoscopy; she denied dysphagia, n/v, etc;  Still not feeling well, min residual cough, notes some neck pain (XRay showed Cspine spondylosis) & she wants MRI=> referral to NS or whatever is necessary; we reviewed Rx w/ Tramadol, Robaxin as well... Her CC today sounds like reactive hypoglycemia & we reviewed diet adjustment- low glycemic index foods, more freq small meals if nec, etc...     We reviewed prob list, meds, xrays and labs> see below for updates >>  CXR 4/14 showed norm heart size, s/p CABG, tort thor Ao, bilat apical pleural scarring, NAD.Marland KitchenMarland Kitchen   ~  September 29, 2012:  798mo ROV & [name] had her MRI CSpine 5/14> multilevel facet hypertrophy, foraminal narrowing & stenosis, disc osteophyte complexes, some central canal stenosis=> she was sent to NS, DrHirsh who rec Mobic & PT (helped alittle) but she was not pleased and is requesting a Rheumatology 2nd opinion about her neck discomfort; notes that insurance won't pay for musc relaxers...     She saw [name] for Cards 5/14> CAD, s/p CABG 2011, hx in-stent restenosis, unable to tol statins (even low dose intermittent rx); Myoview 5/13 was neg- no ischemia & EF>80%; no change in med rx...    She had f/u [name] 6/14> his note is reviewed- s/p lumpectomy, sentinel node, XRT; plan is  to continue Femara2.5mg  thru 3/16 for 35yrs rx    She had GYN f/u 6/14 w/ [name]> doing satis x some dryness... We reviewed prob list, meds, xrays and labs> see below for updates >>            Problem List:  GLAUCOMA (ICD-365.9) - she is sched for right cataract surg by DrBevis in Feb...  ALLERGIC RHINITIS (ICD-477.9) - she uses Allegra & Nasonex Prn; on Allergy shots weekly x yrs... ~  She is followed by [name] at the [facility]...   ASTHMA & Asthmatic Bronchitis >> off Symbicort & using XOPENEX HFA as needed rescue  inhaler... ~  5/11:  notes incr chest symptoms in the heat & rec to take the Symbicort 2sp Bid... ~  2012:  After her CABG & repeat hosp by Cards she is noted to be off her inhalers... ~  10/12:  Treated w/ Zpak, Pred, Mucinex, plus her Symbicort80 & improved... ~  CXR 5/13 showed s/p CABG, clear lungs, NAD (she went to the Er w/ cough & had neg eval). ~  7/13:  [name] stopped her Symbicort & switched to [facility] HFA as needed... ~  2/14: she does not list any inhaled meds at present & states breathing OK w/o exac... ~  4/14: she was hosp after pill asp w/ asthma exac & improved w/ supportive Rx, didn't require bronch, CXR was clear, grad improved post hosp... ~  CXR 4/14 showed normal heart size, s/p CABG, sl tort Ao, bilat apic pleural scarring, NAD...   HYPERTENSION (ICD-401.9) - controlled on TOPROL 50mg Bid & HYZAAR 100-12.5 daily (off Lisinopril due to ACE cough)...  ~  8/12: BP=  128/84 & tol meds well; denies HA, fatigue, visual changes, CP, palipit, syncope, dyspnea, edema, etc... ~  2/13: BP= 140/64 & she remains essentially asymptomatic on Rx... ~  8/13:  BP= 132/64 & she denies CP, palpit, SOB, edema, etc... ~  2/14: controlled on Toprol & Hyzaar w/ BP 144/72 today; denies visual changes, CP, palipit, dizziness, syncope, dyspnea, edema, etc... ~  5/14:  BP= 136/60 & she remains stable... ~  8/14:  BP= 134/60 and she  denies CP, palpit, dizzy, SOB, edema...  CORONARY ARTERY DISEASE (ICD-414.00) - on ASA 81mg /d (intol to 325 she says) & off Plavix per [name]...  Cardiolite 9/08 showed CP & HBP response, EF+85%... had cath 10/08 w/ single vessel dis w/ mod ostial LAD stenosis and patent LAD stent, normal LVF.[name]. option for med Rx vs off pump LIMA... she notes some CP/ pressure "burning" when walking up a certain hill during her daily walks, but states this is stable and "no worse"... ~  Myoview 9/09 was neg- no scar or ischemia, EF= 89%, hypertensive response- Lisinopril10 added. ~  f/u Myoview 3/11 was neg- no scar or ischemia, EF= 86%, norm BP response, fair exerc capacity. ~  saw [name] 5/11- no CP, he stopped her Plavix, stable- continue other meds same. ~  She had recurrent CP 11/11 w/ in-stent restenosis on cath> subseq 3 vessel CABG by DrGearhart & doing well since then... ~  6/12:  Hosp w/ CP & repeat cath showed CAD & 3/3 grafts patent, EF= 65-70% ~  Myoview 5/13 was felt to be a normal stress nuclear study- normal wall motion, no ischemia, EF=84% ~  2/14: CP 11/11=> cath w/ in-stent restenosis, then CABG x3 by [name]; re-hospitalized 6/12 by [facility] for CP> cath showed severe 3 vessel CAD w/ 3/3 grafts patent & norm LVF w/ EF= 65-70%; Myoview 5/13 showed no ischemia & EF>80%; no change in meds; & [name] noted that med rx is limited by side effects...  HYPERLIPIDEMIA (ICD-272.4) - on CRESTOR 10mg - 1/2 on M&F, FISH OIL 1200mg  Tid + Flax Seed Oil... she has been intol to statins in the past>  tried low dose Trilipix but stopped this due to "thigh burning">  now followed in the Lipid Clinic--- ~  FLP 7/08 showed TChol 217, TG 236, HDL 46, LDL 150...  ~  FLP 6/09 showed TChol 233, TG 263, HDL 46, LDL 150... she agrees to try LOW DOSE Trilipix. ~  FLP 11/09 showed TChol  162, TG 167, HDL 45, LDL 83... ~  FLP 11/10 showed TChol 240, TG 271, HDL 45, LDL 150... LipClin Rx w/ FishOil & FlaxSeedOil. ~  FLP  2/11 showed TChol 285, TG 156, HDL 79, LDL 180... LC is aware & working w/ her regularly. ~  FLP 5/11 showed TChol 277, TG 241, HDL55, LDL 193...  LC added Cres10 just 2d per week. ~  FLP 10/11 showed TChol 198, TG 215, HDL 53, LDL 109... much improved on Cres10 twice weekly. ~  She continues to f/u w/ Lipid clinic & they titrated up the Cres10 on M&F, plus 5mg  on Wednes> ~  FLP  10/12 was the best yet w/ TChol 167, TG 203, HDL 54, LDL 88...  ~  FLP 4/13 showed TChol 193, TG 176, HDL 57, LDL 101 ~  She is still followed in the [facility]- note 6/13 reviewed (at that time on Cres10 on M&F, 5mg  on W) but she subseq cut herself down to 1/2 on M&F due to nausea & aching. ~  FLP 2/14 on diet alone showed TChol 194, TG 143, HDL 55, LDL 110... We reviewed diet, exercise, etc... ~  She remains off the [place] on diet alone and she declines to restart low dose rx "I can't tolerate them"  GERD (ICD-530.81) - on OMEPRAZOLE 40mg /d... last EGD by [name] 8/07 revealed sm HH, & gastritis and dilatation done... (RUT=neg, PPI Rx)... LEVSIN 0.125mg  helps her esoph spasm... Prn Phenergan for nausea.  DIVERTICULOSIS, COLON (ICD-562.10) & IRRITABLE BOWEL SYNDROME (ICD-564.1) ~  colonoscopy 2/03 by [name] showed divertics and 5 mm polyp... ~  colonoscopy 3/11 showed divertics & cecal polyp= tubular adenoma, w/ f/u planned 65yrs. ~  9/13: seen by [name] f/u for ?diverticulitis- improved after Cipro/Flagyl but CTAbd was neg x diverticulosis; he rec high fiber diet & Citrucel...  ? GALLSTONES>>  Diagnosed by [name] and referred to [facility] DrWilson, pt asked to call prn fopr incr symptoms to consider surg... ~  Abd Ultrasound 10/13 by [name] for eval CP showed> norm GB, liver, kidneys, & Ao- neg sonar.  BREAST CANCER >> Abn mammogram 10/12 showing a left breast nodule & subseq surg (partial mastectomy & sentinel node bx) by [name] 12/12 that proved to be an invasive ductal carcinoma, 2.3cm size, w/ neg lymphovasc  invasion, clear margins (but close at 1mm), & neg nodes; she had XRT by [name] (had her 11th treatment today); and Oncology eval by [name] who plans hormone Rx later (no chemo she says)...  ~  2/13: She is tolerating treatment well & spirits are up, [name] had to aspirate a seroma & it is now resolved... ~  She is followed by [name], Streck, [place]ard- now on [facility] 2.5mg /d w/ 69yrs planned...  DJD, Neck Pain >> ~  XRays Cspine 4/14 in hosp showed multilevel cerv spondylosis, 1-102mm anterolisthesis C4 on C5, DDD, foraminal stenosis at C5-6...  ~  5/14:  MRI Cspine => multilevel facet hypertrophy, foraminal narrowing & stenosis, disc osteophyte complexes, some central canal stenosis=> she was sent to [facility], DrHirsh who rec Mobic & PT (helped alittle) but she was not pleased... ~  8/14:  She is requesting a Rheumatology 2nd opinion about her neck discomfort...  FIBROMYALGIA (ICD-729.1) - she c/o chr fatigue, aching/ sore, on Tramadol & Tylenol, but most benefit from low dose Hydrocodone ~1/2 tab Prn... I have recommended an increase exercise program to her... ~  5/11:  Vit D level = 50 on 50000 u weekly by Rock Nephew, NP  HEADACHE (ICD-784.0) - eval at [facility] clinic in 2002, but she notes Rx didn't help... ~  3/11:  presented w/ muscle contraction HA's> Rx Tramadol 50mg  Q6H Prn.  DIZZINESS, CHRONIC (ICD-780.4) - MRI 2/09 per [name] showed atrophy, sm vessel dis, NAD...  Health Maintenance: ~  GI:  [name] & up to date on colon screening. ~  GYN:  yearly f/u w/ Rock Nephew, NP for PAP, Mammogram at [facility], ?last BMD. ~  Immunizations:  yearly Flu vaccines in fall, Pneumovax in 2007?, TDAP given 5/11...   Past Surgical History  Procedure Laterality Date  . Coronary angioplasty with stent placement      Stent 2007  . Open heart surgery      01/19/2010  . Coronary artery bypass graft    . Eye surgery      bilateral cataract removal,  . Breast lumpectomy  02/06/11     left  . Cardiac stints    . Glaucoma repair      Outpatient Encounter Prescriptions as of 09/29/2012  Medication Sig Dispense Refill  . aspirin 81 MG tablet Take 81 mg by mouth daily.        . Coenzyme Q10 (COQ10) 100 MG CAPS Take 1 capsule by mouth daily.        . fexofenadine (ALLEGRA) 180 MG tablet Take 180 mg by mouth daily.        . Glucosamine-Chondroit-Vit C-Mn (GLUCOSAMINE CHONDR 1500 COMPLX PO) Take 1 tablet by mouth 3 (three) times daily.        . hyoscyamine (ANASPAZ) 0.125 MG TBDP Place 1 tablet (0.125 mg total) under the tongue every 6 (six) hours as needed.  40 tablet  2  . isosorbide mononitrate (IMDUR) 30 MG 24 hr tablet Take 1 tablet (30 mg total) by mouth daily.  90 tablet  3  . letrozole (FEMARA) 2.5 MG tablet TAKE 1 TABLET BY MOUTH ONCE DAILY  30 tablet  0  . losartan-hydrochlorothiazide (HYZAAR) 100-12.5 MG per tablet Take 1 tablet by mouth daily.  30 tablet  6  . meloxicam (MOBIC) 7.5 MG tablet Take 7.5 mg by mouth 2 (two) times daily as needed for pain.      . metoprolol (LOPRESSOR) 50 MG tablet Take 1 tablet (50 mg total) by mouth 2 (two) times daily.  60 tablet  5  . mometasone (NASONEX) 50 MCG/ACT nasal spray 2 sprays by Nasal route daily.        . nitroGLYCERIN (NITROSTAT) 0.4 MG SL tablet Take one tablet  By mouth SL every 5 minutes for a total of 3 doses as needed for chest pain.  25 tablet  4  . Omega-3 Fatty Acids (FISH OIL) 1200 MG CAPS 2 tabs po bid       . omeprazole (PRILOSEC) 40 MG capsule take 1 capsule by mouth once daily 30 MINUTES BEFORE THE FIRST MEAL OF THE DAY  30 capsule  6  . traMADol (ULTRAM) 50 MG tablet Take 1 tablet (50 mg total) by mouth 3 (three) times daily as needed. For pain  90 tablet  5  . travoprost, benzalkonium, (TRAVATAN) 0.004 % ophthalmic solution Place 1 drop into both eyes at bedtime.       . vitamin B-12 (CYANOCOBALAMIN) 1000 MCG tablet Take 1,000 mcg by mouth daily.        . Vitamin D, Ergocalciferol, (DRISDOL) 50000 UNITS CAPS  Take 1 capsule (50,000 Units total) by mouth every 14 (fourteen) days. Every other week  30 capsule  3   No facility-administered encounter medications on file as of 09/29/2012.    Allergies  Allergen Reactions  . Choline Fenofibrate Other (See Comments)    REACTION: pt states INTOL to Trilipix w/ "thigh burning"  . Simvastatin Other (See Comments)    REACTION: pt states INTOL to STATINS \\T \ refuses to restart  . Hydrocodone     Nightmare after taking cough syrup w/hydrocodone  . Adhesive (Tape) Rash  . Ceclor (Cefaclor) Rash  . Clarithromycin Rash  . Codeine Nausea Only  . Doxycycline Rash  . Lisinopril Cough    Developed ACE cough...  . Penicillins Itching and Rash    At injection site  . Tobramycin-Dexamethasone Rash    Current Medications, Allergies, Past Medical History, Past Surgical History, Family History, and Social History were reviewed in [employer] record.    Review of Systems    See HPI - all other systems neg except as noted...  The patient complains of chest pain and headaches.  The patient denies anorexia, fever, weight loss, weight gain, vision loss, decreased hearing, hoarseness, syncope, dyspnea on exertion, peripheral edema, prolonged cough, hemoptysis, abdominal pain, melena, hematochezia, severe indigestion/heartburn, hematuria, incontinence, muscle weakness, suspicious skin lesions, transient blindness, difficulty walking, depression, unusual weight change, abnormal bleeding, enlarged lymph nodes, and angioedema.   Objective:   Physical Exam    WD, WN, 77 y/o WF in NAD... GENERAL:  Alert & oriented; pleasant & cooperative... HEENT:  [place]/AT, EOM-wnl, PERRLA, EACs-clear, TMs-wnl, NOSE-clear, THROAT-clear & wnl. NECK:  Decr ROM; no JVD; normal carotid impulses w/o bruits; no thyromegaly or nodules palpated; no lymphadenopathy. CHEST:  Clear to P & A; without wheezes/ rales/ or rhonchi heard... HEART:  Regular Rhythm; without murmurs/  rubs/ or gallops detected... ABDOMEN:  Soft & nontender; normal bowel sounds; no organomegaly or masses palpated... EXT: without deformities, mild arthritic changes and +trigger points, no varicose veins/ venous insuffic/ or edema. NEURO:  CN's intact; motor testing normal; sensory testing normal; gait normal & balance OK. DERM:  No lesions noted; no rash etc...  RADIOLOGY DATA:  Reviewed in the EPIC EMR & discussed w/ the patient...  LABORATORY DATA:  Reviewed in the EPIC EMR & discussed w/ the patient...   Assessment & Plan:    Asthma/ AR>  Stable on allergy shots & XOPENEX as needed; no recent exac... Hx pill asp w/ asthma exac=> now resolved...  HBP>  Controlled on Toprol & Hyzaar, continue same...  CAD>  Cath w/ patent grafts; had neg Nuclear study; continue same meds and f/u [name]...  CHOL>  Followed in the Lipid Clinic & intol to all meds- FLP looks better however on diet alone...  GI> GERD, Divertics, ?Gallstones>  Followed by [employer] [facility] & stones eval by DrWilson [facility]; odd that f/u sonar 9/13 did not show stones...  BREAST CANCER>  on [facility] now> treated by [name], [name], DrKinard & records reviewed...  FM/ HAs/ etc>  On Ultram, Tylenol, etc... NECK PAIN> with multilevel spondylosis & eval by [facility], DrHirsh but she was not pleased & is asking for Rheum eval...  Other medical issues as noted> on CoQ10, Glucosamine, Vit B12, Vit D, etc...   Patient's Medications  New Prescriptions   No medications on file  Previous Medications   ASPIRIN 81 MG TABLET    Take 81 mg by mouth daily.     COENZYME Q10 (COQ10) 100 MG CAPS    Take 1 capsule by mouth daily.     FEXOFENADINE (ALLEGRA) 180 MG  TABLET    Take 180 mg by mouth daily.     GLUCOSAMINE-CHONDROIT-VIT C-MN (GLUCOSAMINE CHONDR 1500 COMPLX PO)    Take 1 tablet by mouth 3 (three) times daily.     HYOSCYAMINE (ANASPAZ) 0.125 MG TBDP    Place 1 tablet (0.125 mg total) under the tongue every 6 (six) hours as needed.    ISOSORBIDE MONONITRATE (IMDUR) 30 MG 24 HR TABLET    Take 1 tablet (30 mg total) by mouth daily.   LETROZOLE (FEMARA) 2.5 MG TABLET    TAKE 1 TABLET BY MOUTH ONCE DAILY   LOSARTAN-HYDROCHLOROTHIAZIDE (HYZAAR) 100-12.5 MG PER TABLET    Take 1 tablet by mouth daily.   MELOXICAM (MOBIC) 7.5 MG TABLET    Take 7.5 mg by mouth 2 (two) times daily as needed for pain.   METOPROLOL (LOPRESSOR) 50 MG TABLET    Take 1 tablet (50 mg total) by mouth 2 (two) times daily.   MOMETASONE (NASONEX) 50 MCG/ACT NASAL SPRAY    2 sprays by Nasal route daily.     NITROGLYCERIN (NITROSTAT) 0.4 MG SL TABLET    Take one tablet  By mouth SL every 5 minutes for a total of 3 doses as needed for chest pain.   OMEGA-3 FATTY ACIDS (FISH OIL) 1200 MG CAPS    2 tabs po bid    OMEPRAZOLE (PRILOSEC) 40 MG CAPSULE    take 1 capsule by mouth once daily 30 MINUTES BEFORE THE FIRST MEAL OF THE DAY   TRAMADOL (ULTRAM) 50 MG TABLET    Take 1 tablet (50 mg total) by mouth 3 (three) times daily as needed. For pain   TRAVOPROST, BENZALKONIUM, (TRAVATAN) 0.004 % OPHTHALMIC SOLUTION    Place 1 drop into both eyes at bedtime.    VITAMIN B-12 (CYANOCOBALAMIN) 1000 MCG TABLET    Take 1,000 mcg by mouth daily.    Modified Medications   No medications on file  Discontinued Medications   VITAMIN D, ERGOCALCIFEROL, (DRISDOL) 50000 UNITS CAPS    Take 1 capsule (50,000 Units total) by mouth every 14 (fourteen) days. Every other week

## 2012-10-02 ENCOUNTER — Ambulatory Visit: Payer: Medicare Other | Admitting: Physical Therapy

## 2012-10-02 DIAGNOSIS — J309 Allergic rhinitis, unspecified: Secondary | ICD-10-CM | POA: Diagnosis not present

## 2012-10-06 DIAGNOSIS — M5030 Other cervical disc degeneration, unspecified cervical region: Secondary | ICD-10-CM | POA: Diagnosis not present

## 2012-10-06 DIAGNOSIS — M9981 Other biomechanical lesions of cervical region: Secondary | ICD-10-CM | POA: Diagnosis not present

## 2012-10-09 DIAGNOSIS — J309 Allergic rhinitis, unspecified: Secondary | ICD-10-CM | POA: Diagnosis not present

## 2012-10-16 DIAGNOSIS — J309 Allergic rhinitis, unspecified: Secondary | ICD-10-CM | POA: Diagnosis not present

## 2012-10-20 DIAGNOSIS — M5030 Other cervical disc degeneration, unspecified cervical region: Secondary | ICD-10-CM | POA: Diagnosis not present

## 2012-10-20 DIAGNOSIS — M9981 Other biomechanical lesions of cervical region: Secondary | ICD-10-CM | POA: Diagnosis not present

## 2012-10-22 DIAGNOSIS — K219 Gastro-esophageal reflux disease without esophagitis: Secondary | ICD-10-CM | POA: Diagnosis not present

## 2012-10-22 DIAGNOSIS — J45909 Unspecified asthma, uncomplicated: Secondary | ICD-10-CM | POA: Diagnosis not present

## 2012-10-22 DIAGNOSIS — J301 Allergic rhinitis due to pollen: Secondary | ICD-10-CM | POA: Diagnosis not present

## 2012-10-22 DIAGNOSIS — J3089 Other allergic rhinitis: Secondary | ICD-10-CM | POA: Diagnosis not present

## 2012-10-31 ENCOUNTER — Other Ambulatory Visit: Payer: Self-pay | Admitting: Physician Assistant

## 2012-11-02 ENCOUNTER — Emergency Department (HOSPITAL_BASED_OUTPATIENT_CLINIC_OR_DEPARTMENT_OTHER)
Admission: EM | Admit: 2012-11-02 | Discharge: 2012-11-02 | Disposition: A | Discharge status: Home / Self Care | Payer: Medicare Other | Attending: Emergency Medicine | Admitting: Emergency Medicine

## 2012-11-02 ENCOUNTER — Telehealth: Payer: Self-pay | Admitting: Pulmonary Disease

## 2012-11-02 ENCOUNTER — Emergency Department (HOSPITAL_BASED_OUTPATIENT_CLINIC_OR_DEPARTMENT_OTHER): Payer: Medicare Other

## 2012-11-02 ENCOUNTER — Encounter (HOSPITAL_BASED_OUTPATIENT_CLINIC_OR_DEPARTMENT_OTHER): Payer: Self-pay | Admitting: *Deleted

## 2012-11-02 DIAGNOSIS — Z8742 Personal history of other diseases of the female genital tract: Secondary | ICD-10-CM | POA: Diagnosis not present

## 2012-11-02 DIAGNOSIS — Z880 Allergy status to penicillin: Secondary | ICD-10-CM | POA: Diagnosis not present

## 2012-11-02 DIAGNOSIS — Z9889 Other specified postprocedural states: Secondary | ICD-10-CM | POA: Diagnosis not present

## 2012-11-02 DIAGNOSIS — IMO0002 Reserved for concepts with insufficient information to code with codable children: Secondary | ICD-10-CM | POA: Insufficient documentation

## 2012-11-02 DIAGNOSIS — Z7982 Long term (current) use of aspirin: Secondary | ICD-10-CM | POA: Diagnosis not present

## 2012-11-02 DIAGNOSIS — R112 Nausea with vomiting, unspecified: Secondary | ICD-10-CM | POA: Insufficient documentation

## 2012-11-02 DIAGNOSIS — H409 Unspecified glaucoma: Secondary | ICD-10-CM | POA: Diagnosis not present

## 2012-11-02 DIAGNOSIS — I2510 Atherosclerotic heart disease of native coronary artery without angina pectoris: Secondary | ICD-10-CM | POA: Insufficient documentation

## 2012-11-02 DIAGNOSIS — Z8701 Personal history of pneumonia (recurrent): Secondary | ICD-10-CM | POA: Diagnosis not present

## 2012-11-02 DIAGNOSIS — Z853 Personal history of malignant neoplasm of breast: Secondary | ICD-10-CM | POA: Diagnosis not present

## 2012-11-02 DIAGNOSIS — M47812 Spondylosis without myelopathy or radiculopathy, cervical region: Secondary | ICD-10-CM | POA: Diagnosis not present

## 2012-11-02 DIAGNOSIS — R51 Headache: Secondary | ICD-10-CM

## 2012-11-02 DIAGNOSIS — Z862 Personal history of diseases of the blood and blood-forming organs and certain disorders involving the immune mechanism: Secondary | ICD-10-CM | POA: Insufficient documentation

## 2012-11-02 DIAGNOSIS — I10 Essential (primary) hypertension: Secondary | ICD-10-CM | POA: Diagnosis not present

## 2012-11-02 DIAGNOSIS — J45909 Unspecified asthma, uncomplicated: Secondary | ICD-10-CM | POA: Diagnosis not present

## 2012-11-02 DIAGNOSIS — Z79899 Other long term (current) drug therapy: Secondary | ICD-10-CM | POA: Insufficient documentation

## 2012-11-02 DIAGNOSIS — K219 Gastro-esophageal reflux disease without esophagitis: Secondary | ICD-10-CM | POA: Diagnosis not present

## 2012-11-02 DIAGNOSIS — Z860100 Personal history of colon polyps, unspecified: Secondary | ICD-10-CM | POA: Insufficient documentation

## 2012-11-02 DIAGNOSIS — Z8639 Personal history of other endocrine, nutritional and metabolic disease: Secondary | ICD-10-CM | POA: Insufficient documentation

## 2012-11-02 HISTORY — DX: Spondylosis without myelopathy or radiculopathy, cervical region: M47.812

## 2012-11-02 MED ORDER — OXYCODONE-ACETAMINOPHEN 5-325 MG PO TABS: 1.0000 | ORAL_TABLET | Freq: Four times a day (QID) | ORAL | Status: DC | PRN

## 2012-11-02 MED ORDER — ONDANSETRON HCL 4 MG/2ML IJ SOLN
INTRAMUSCULAR | Status: AC
  Filled 2012-11-02: qty 2

## 2012-11-02 MED ORDER — ONDANSETRON HCL 4 MG/2ML IJ SOLN
4.0000 mg | Freq: Once | INTRAMUSCULAR | Status: AC
  Administered 2012-11-02: 4 mg via INTRAVENOUS
  Filled 2012-11-02: qty 2

## 2012-11-02 MED ORDER — METHOCARBAMOL 100 MG/ML IJ SOLN
1000.0000 mg | Freq: Once | INTRAMUSCULAR | Status: DC
  Filled 2012-11-02: qty 10

## 2012-11-02 MED ORDER — METHOCARBAMOL 100 MG/ML IJ SOLN
1000.0000 mg | Freq: Once | INTRAMUSCULAR | Status: AC
  Administered 2012-11-02: 1000 mg via INTRAVENOUS
  Filled 2012-11-02: qty 10

## 2012-11-02 MED ORDER — ONDANSETRON HCL 4 MG PO TABS: 4.0000 mg | ORAL_TABLET | Freq: Three times a day (TID) | ORAL | Status: DC | PRN

## 2012-11-02 MED ORDER — ONDANSETRON HCL 4 MG/2ML IJ SOLN
4.0000 mg | Freq: Once | INTRAMUSCULAR | Status: AC
  Administered 2012-11-02: 4 mg via INTRAVENOUS

## 2012-11-02 MED ORDER — FENTANYL CITRATE 0.05 MG/ML IJ SOLN
50.0000 ug | Freq: Once | INTRAMUSCULAR | Status: AC
  Administered 2012-11-02: 50 ug via INTRAVENOUS
  Filled 2012-11-02: qty 2

## 2012-11-02 MED ORDER — SODIUM CHLORIDE 0.9 % IV BOLUS (SEPSIS)
500.0000 mL | Freq: Once | INTRAVENOUS | Status: AC
  Administered 2012-11-02: 500 mL via INTRAVENOUS

## 2012-11-02 MED ORDER — KETOROLAC TROMETHAMINE 30 MG/ML IJ SOLN
30.0000 mg | Freq: Once | INTRAMUSCULAR | Status: AC
  Administered 2012-11-02: 30 mg via INTRAVENOUS
  Filled 2012-11-02: qty 1

## 2012-11-02 NOTE — Telephone Encounter (Signed)
LMOMTCB x 1 

## 2012-11-02 NOTE — ED Provider Notes (Signed)
CSN: [id number]     Arrival date & time 11/02/12  1034 History   First MD Initiated Contact with Patient 11/02/12 1044     Chief Complaint  Patient presents with  . Headache   (Consider location/radiation/quality/duration/timing/severity/associated sxs/prior Treatment) Patient is a 77 y.o. female presenting with headaches.  Headache  Pt with history of multiple medical problems including cervical arthritis and HTN reports she woke up this AM with mild dull headache that was worsening during the morning especially while out for her routine walk. She reports pain is now severe aching posterior headache, associated with nausea and vomiting. Her BP has been elevated as well. States she had some L arm tingling while eating breakfast, but has since resolved. Did not have weakness, dysphagia, slurred speech or facial droop per husband at bedside. She has had similar headaches before attributed to her neck problems. Has Mobic at home which did not help. Did not try Tramadol.   Past Medical History  Diagnosis Date  . Unspecified glaucoma(365.9)   . Allergic rhinitis, cause unspecified   . Unspecified asthma(493.90)   . Unspecified essential hypertension   . Other and unspecified hyperlipidemia   . Gastritis   . Abdominal pain, left lower quadrant   . Diverticulosis of colon (without mention of hemorrhage)   . Irritable bowel syndrome   . Colonic polyp 04-27-2009    tubular adenoma  . Headache(784.0)   . Dizziness   . Breast cancer, Left 12/20/2010  . Gallstones   . CAD (coronary artery disease)     a. 01/19/2010 s/p CABG x 3, lima->lad, vg->diag, vg->om1;  b. 06/2011 :Lexi MV: EF 84%, No ischemia.  . Bronchitis     hx of  . Pneumonia   . Hiatal hernia   . Fibromyalgia   . Esophageal reflux     hiatal hernia  . Atrophic vaginitis   . GERD (gastroesophageal reflux disease)   . Hypercholesterolemia   . Breast cancer   . Glaucoma   . Cervical arthritis    Past Surgical History   Procedure Laterality Date  . Coronary angioplasty with stent placement      Stent 2007  . Open heart surgery      01/19/2010  . Coronary artery bypass graft    . Eye surgery      bilateral cataract removal,  . Breast lumpectomy  02/06/11    left  . Cardiac stints    . Glaucoma repair     Family History  Problem Relation Age of Onset  . Heart disease Brother   . Cancer Brother     gland cancer  . Diabetes Sister   . Breast cancer Sister   . Stroke Mother   . Stroke Father    History  Substance Use Topics  . Smoking status: Never Smoker   . Smokeless tobacco: Never Used  . Alcohol Use: No   OB History   Grav Para Term Preterm Abortions TAB SAB Ect Mult Living   3 2   1     2      Review of Systems  Neurological: Positive for headaches.   All other systems reviewed and are negative except as noted in HPI.   Allergies  Choline fenofibrate; Simvastatin; Hydrocodone; Adhesive; Ceclor; Clarithromycin; Codeine; Doxycycline; Lisinopril; Penicillins; and Tobramycin-dexamethasone  Home Medications   Current Outpatient Rx  Name  Route  Sig  Dispense  Refill  . aspirin 81 MG tablet   Oral   Take 81 mg by mouth  daily.           . Coenzyme Q10 (COQ10) 100 MG CAPS   Oral   Take 1 capsule by mouth daily.           . fexofenadine (ALLEGRA) 180 MG tablet   Oral   Take 180 mg by mouth daily.           . Glucosamine-Chondroit-Vit C-Mn (GLUCOSAMINE CHONDR 1500 COMPLX PO)   Oral   Take 1 tablet by mouth 3 (three) times daily.           . hyoscyamine (ANASPAZ) 0.125 MG TBDP   Sublingual   Place 1 tablet (0.125 mg total) under the tongue every 6 (six) hours as needed.   40 tablet   2   . isosorbide mononitrate (IMDUR) 30 MG 24 hr tablet   Oral   Take 1 tablet (30 mg total) by mouth daily.   90 tablet   3   . letrozole (FEMARA) 2.5 MG tablet      TAKE 1 TABLET BY MOUTH ONCE DAILY   30 tablet   0   . losartan-hydrochlorothiazide (HYZAAR) 100-12.5 MG per  tablet   Oral   Take 1 tablet by mouth daily.   30 tablet   6   . meloxicam (MOBIC) 7.5 MG tablet   Oral   Take 7.5 mg by mouth 2 (two) times daily as needed for pain.         . metoprolol (LOPRESSOR) 50 MG tablet   Oral   Take 1 tablet (50 mg total) by mouth 2 (two) times daily.   60 tablet   5     Refill Approved   . mometasone (NASONEX) 50 MCG/ACT nasal spray   Nasal   2 sprays by Nasal route daily.           . nitroGLYCERIN (NITROSTAT) 0.4 MG SL tablet      Take one tablet  By mouth SL every 5 minutes for a total of 3 doses as needed for chest pain.   25 tablet   4   . Omega-3 Fatty Acids (FISH OIL) 1200 MG CAPS      2 tabs po bid          . omeprazole (PRILOSEC) 40 MG capsule      take 1 capsule by mouth once daily 30 MINUTES BEFORE THE FIRST MEAL OF THE DAY   30 capsule   6   . traMADol (ULTRAM) 50 MG tablet   Oral   Take 1 tablet (50 mg total) by mouth 3 (three) times daily as needed. For pain   90 tablet   5   . travoprost, benzalkonium, (TRAVATAN) 0.004 % ophthalmic solution   Both Eyes   Place 1 drop into both eyes at bedtime.          . vitamin B-12 (CYANOCOBALAMIN) 1000 MCG tablet   Oral   Take 1,000 mcg by mouth daily.            BP 162/100  Pulse 64  Temp(Src) 97.9 F (36.6 C) (Oral)  Resp 20  SpO2 100% Physical Exam  Nursing note and vitals reviewed. Constitutional: She is oriented to person, place, and time. She appears well-developed and well-nourished.  HENT:  Head: Normocephalic and atraumatic.  Eyes: EOM are normal. Pupils are equal, round, and reactive to light.  Neck: Normal range of motion. Neck supple.  Cardiovascular: Normal rate, normal heart sounds and intact distal pulses.  Pulmonary/Chest: Effort normal and breath sounds normal.  Abdominal: Bowel sounds are normal. She exhibits no distension. There is no tenderness.  Musculoskeletal: Normal range of motion. She exhibits tenderness (cervical paraspinal  tenderness). She exhibits no edema.  Neurological: She is alert and oriented to person, place, and time. She has normal strength. No cranial nerve deficit or sensory deficit. She exhibits normal muscle tone. Coordination normal.  Skin: Skin is warm and dry. No rash noted.  Psychiatric: She has a normal mood and affect.    ED Course  Procedures (including critical care time) Labs Review Labs Reviewed - No data to display Imaging Review Ct Head Wo Contrast  11/02/2012   *RADIOLOGY REPORT*  Clinical Data: Headache.  History of breast cancer.  CT HEAD WITHOUT CONTRAST  Technique:  Contiguous axial images were obtained from the base of the skull through the vertex without contrast.  Comparison: 11/07/2011  Findings: No intracranial hemorrhage or extra-axial fluid collection is identified. There is no mass lesion on this noncontrast examination. No midline shift. No evidence of acute infarction. There is patchy white matter hypoattenuation, likely secondary to chronic small vessel ischemia. Physiologic calcifications in the basal ganglia are noted.  The ventricles and sulci are prominent, consistent with age related central and cortical atrophy. The mastoid air cells and paranasal sinuses are clear. The osseous structures are unremarkable.  IMPRESSION: No acute intracranial abnormality.   Original Report Authenticated By: Jerene Dilling, M.D.    MDM   1. Headache   2. Cervical spine arthritis     Pt feeling some better with meds given in the ED. She and husband state she had appointment scheduled with an arthritis doctor today but will not be able to make it now. Advised to continue Mobic. Will give Norco instead of Ultram. Advised to reschedule with arthritis doctor or follow up with PCP in 2-3 days if not better.    Charles B. Bernette Mayers, MD 11/02/12 1428

## 2012-11-02 NOTE — ED Notes (Signed)
Patient states she woke up this morning around 0630 and had a dull headache.  States she went out for her morning walk and walked for approximately 25 minutes.  States she continued to feel bad, so she cut her normal walk short and went.  Now c/o severe headache, and nausea and tingling in her left arm.  States she recently has been diagnosed with arthritis in her neck and has an appointment today an Orthro MD.

## 2012-11-03 DIAGNOSIS — M9981 Other biomechanical lesions of cervical region: Secondary | ICD-10-CM | POA: Diagnosis not present

## 2012-11-03 DIAGNOSIS — M5030 Other cervical disc degeneration, unspecified cervical region: Secondary | ICD-10-CM | POA: Diagnosis not present

## 2012-11-03 NOTE — Telephone Encounter (Signed)
Called, spoke with pt - Pt states yesterday she had a "real bad HA."  She went to [facility] ED.  Pt states this is slightly improved.  Still has a dull HA and head feels "heavy" still in the back.  Advised I would route to SN as FYI.  Pt verbalized understanding and voiced no further questions or concerns at this time.  ** Pt states she was unable to keep appt with Dr. Lendon Colonel yesterday d/t being in the ED.  States they did call Dr. Lendon Colonel office from the ED to advise of this.  Advised she should call Dr. Lendon Colonel office this am to reschedule this appt.  Pt verbalized understanding.

## 2012-11-03 NOTE — Telephone Encounter (Signed)
Per SN---  Advise the recurrent headaches need neurology eval and follow up with Headache clinic.  Could try midrin #50  1 po every 6 hours as needed for headache.

## 2012-11-03 NOTE — Telephone Encounter (Signed)
ATC home # line busy x 3

## 2012-11-03 NOTE — Telephone Encounter (Signed)
There will have to ba an order put in for this Tobe Sos

## 2012-11-04 ENCOUNTER — Telehealth: Payer: Self-pay | Admitting: Gastroenterology

## 2012-11-04 NOTE — Telephone Encounter (Signed)
Bentyl 10 mg. q 6-8h prn

## 2012-11-04 NOTE — Telephone Encounter (Signed)
Hyoscyamine not covered under patients insurance, what else can we send?

## 2012-11-04 NOTE — Telephone Encounter (Signed)
Spoke with the pt and notified of recs per SN She declined midrin, stating that she has percocet given by ED doc and this is helping She states that she has been to HA clinic before, and will call for f/u with them Order sent to [facility] for neuro eval Nothing further needed per pt

## 2012-11-05 DIAGNOSIS — J309 Allergic rhinitis, unspecified: Secondary | ICD-10-CM | POA: Diagnosis not present

## 2012-11-06 ENCOUNTER — Encounter: Payer: Self-pay | Admitting: Neurology

## 2012-11-06 ENCOUNTER — Ambulatory Visit (INDEPENDENT_AMBULATORY_CARE_PROVIDER_SITE_OTHER): Payer: Medicare Other | Admitting: Neurology

## 2012-11-06 VITALS — BP 169/79 | HR 60 | Ht <= 58 in | Wt 132.0 lb

## 2012-11-06 DIAGNOSIS — E785 Hyperlipidemia, unspecified: Secondary | ICD-10-CM

## 2012-11-06 DIAGNOSIS — I10 Essential (primary) hypertension: Secondary | ICD-10-CM | POA: Diagnosis not present

## 2012-11-06 DIAGNOSIS — R51 Headache: Secondary | ICD-10-CM | POA: Diagnosis not present

## 2012-11-06 MED ORDER — NORTRIPTYLINE HCL 10 MG PO CAPS: 20.0000 mg | ORAL_CAPSULE | Freq: Every day | ORAL | Status: DC

## 2012-11-06 NOTE — Progress Notes (Signed)
[facility]  PATIENT: [name] DOB: [birth year]  HISTORICAL  Rubylee is a 77 years old right-handed Caucasian female, referred by her primary care physician Dr. Alroy Dust for evaluation of headaches  She had a past medical history of coronary artery disease, status post a stent, hypertension, hyperlipidemia, she is taking beta blocker metoprolol 50 mg twice a day. she also has a history of breast cancer, status post left lobectomy followed by chemotherapy and radiation therapy in 2012   She reported a history of migraine headaches since age 71s, her typical migraine are occipital area bandlike, severe pounding headache with associated light noise sensitivity, clustered around her menstruation period of time, her headache has quieted down for many years, until about 6 months ago, she began to experience frequent headaches again, somewhat similar to the headache she had previously, bandlike starting from occipital,to bilateral temporal frontal region, pressure, with associated light noise sensitivity.  She reported 15 days out of a month, she would have moderate degree of headaches    She has tried ice and heating pad, which helped her headache some, she is also taking Percocet, Zofran as needed for nausea.  She had CAT scan of the brain that was normal, MRI of cervical spine showed multilevel degenerative disc disease, mild right foraminal stenosis at C3-4, secondary to asymmetric right-sided facet hypertrophy, and joint disease,     She denies jaw claudication, no gait difficulty no diffuse muscle achy pain, no chewing or swallowing difficulties  REVIEW OF SYSTEMS: Full 14 system review of systems performed and notable only for  feeling cold, diarrhea, headache, dizziness, restless leg,  blurred vision, allergies   ALLERGIES: Allergies  Allergen Reactions  . Choline Fenofibrate Other (See Comments)    REACTION: pt states INTOL to Trilipix w/ "thigh  burning"  . Simvastatin Other (See Comments)    REACTION: pt states INTOL to STATINS \\T \ refuses to restart  . Hydrocodone     Nightmare after taking cough syrup w/hydrocodone  . Adhesive [Tape] Rash  . Ceclor [Cefaclor] Rash  . Clarithromycin Rash  . Codeine Nausea Only  . Doxycycline Rash  . Lisinopril Cough    Developed ACE cough...  . Penicillins Itching and Rash    At injection site  . Tobramycin-Dexamethasone Rash    HOME MEDICATIONS: Outpatient Prescriptions Prior to Visit  Medication Sig Dispense Refill  . aspirin 81 MG tablet Take 81 mg by mouth daily.        . Coenzyme Q10 (COQ10) 100 MG CAPS Take 1 capsule by mouth daily.        . fexofenadine (ALLEGRA) 180 MG tablet Take 180 mg by mouth daily.        . Glucosamine-Chondroit-Vit C-Mn (GLUCOSAMINE CHONDR 1500 COMPLX PO) Take 1 tablet by mouth 3 (three) times daily.        . hyoscyamine (ANASPAZ) 0.125 MG TBDP tablet PLACE 1 TABLET UNDER THE TONGUE EVERY 6 HOURS AS NEEDED.  40 tablet  2  . isosorbide mononitrate (IMDUR) 30 MG 24 hr tablet Take 1 tablet (30 mg total) by mouth daily.  90 tablet  3  . letrozole (FEMARA) 2.5 MG tablet TAKE 1 TABLET BY MOUTH ONCE DAILY  30 tablet  0  . losartan-hydrochlorothiazide (HYZAAR) 100-12.5 MG per tablet Take 1 tablet by mouth daily.  30 tablet  6  . meloxicam (MOBIC) 7.5 MG tablet Take 7.5 mg by mouth 2 (two) times daily as needed for pain.      [name]  metoprolol (LOPRESSOR) 50 MG tablet Take 1 tablet (50 mg total) by mouth 2 (two) times daily.  60 tablet  5  . mometasone (NASONEX) 50 MCG/ACT nasal spray 2 sprays by Nasal route daily.        . nitroGLYCERIN (NITROSTAT) 0.4 MG SL tablet Take one tablet  By mouth SL every 5 minutes for a total of 3 doses as needed for chest pain.  25 tablet  4  . Omega-3 Fatty Acids (FISH OIL) 1200 MG CAPS 2 tabs po bid       . omeprazole (PRILOSEC) 40 MG capsule take 1 capsule by mouth once daily 30 MINUTES BEFORE THE FIRST MEAL OF THE DAY  30 capsule  6  .  ondansetron (ZOFRAN) 4 MG tablet Take 1 tablet (4 mg total) by mouth every 8 (eight) hours as needed for nausea.  12 tablet  0  . oxyCODONE-acetaminophen (PERCOCET/ROXICET) 5-325 MG per tablet Take 1-2 tablets by mouth every 6 (six) hours as needed for pain.  20 tablet  0  . travoprost, benzalkonium, (TRAVATAN) 0.004 % ophthalmic solution Place 1 drop into both eyes at bedtime.       . vitamin B-12 (CYANOCOBALAMIN) 1000 MCG tablet Take 1,000 mcg by mouth daily.         No facility-administered medications prior to visit.    PAST MEDICAL HISTORY: Past Medical History  Diagnosis Date  . Unspecified glaucoma(365.9)   . Allergic rhinitis, cause unspecified   . Unspecified asthma(493.90)   . Unspecified essential hypertension   . Other and unspecified hyperlipidemia   . Gastritis   . Diverticulosis of colon (without mention of hemorrhage)   . Irritable bowel syndrome   . Colonic polyp 04-27-2009  . Headache(784.0)   . Dizziness   . Breast cancer, Left, followed by chemo/radiation 12/20/2010  . Gallstones   . CAD (coronary artery disease)     a. 01/19/2010 s/p CABG x 3, lima->lad, vg->diag, vg->om1;  b. 06/2011 :Lexi MV: EF 84%, No ischemia.  . Bronchitis   . Pneumonia   . Hiatal hernia   . Fibromyalgia   . Esophageal reflux     hiatal hernia  . Atrophic vaginitis   . GERD (gastroesophageal reflux disease)   . Hypercholesterolemia   . Breast cancer   . Glaucoma   . Cervical arthritis     PAST SURGICAL HISTORY: Past Surgical History  Procedure Laterality Date  . Coronary angioplasty with stent placement      Stent 2007  . Open heart surgery      01/19/2010  . Coronary artery bypass graft    . Eye surgery      bilateral cataract removal,  . Breast lumpectomy  02/06/11    left  . Cardiac stent    . Glaucoma repair      FAMILY HISTORY: Family History  Problem Relation Age of Onset  . Heart disease Brother   . Cancer Brother   . Diabetes Sister   . Breast cancer Sister    . Stroke Mother   . Stroke Father     SOCIAL HISTORY:  History   Social History  . Marital Status: Married    Spouse Name: Lorin Picket. Cloe    Number of Children: 2  . Years of Education: 12   Occupational History  . part time preschool teacher-retired    Social History Main Topics  . Smoking status: Never Smoker   . Smokeless tobacco: Never Used  . Alcohol Use: No  .  Drug Use: No  . Sexual Activity: Yes    Birth Control/ Protection: Post-menopausal    Social History Narrative   Patient lives at home with her husband Brewing technologist). Patient is retired. Patient  Has 12 th grade education.    Caffeine- sometimes- One cup of coffee.   Right handed.   Four granddaughters.     PHYSICAL EXAM    Filed Vitals:   11/06/12 1018  BP: 169/79  Pulse: 60  Height: 4\' 9"  (1.448 m)  Weight: 132 lb (59.875 kg)    Body mass index is 28.56 kg/(m^2).   Generalized: In no acute distress  Neck: Supple, no carotid bruits   Cardiac: Regular rate rhythm  Pulmonary: Clear to auscultation bilaterally  Musculoskeletal: No deformity  Neurological examination  Mentation: Alert oriented to time, place, history taking, and causual conversation  Cranial nerve II-XII: Pupils were equal round reactive to light extraocular movements were full, visual field were full on confrontational test. facial sensation and strength were normal. hearing was intact to finger rubbing bilaterally. Uvula tongue midline.  head turning and shoulder shrug and were normal and symmetric.Tongue protrusion into cheek strength was normal.  Motor: normal tone, bulk and strength.  Sensory: Intact to fine touch, pinprick, preserved vibratory sensation, and proprioception at toes.  Coordination: Normal finger to nose, heel-to-shin bilaterally there was no truncal ataxia  Gait: Rising up from seated position without assistance, normal stance, without trunk ataxia, moderate stride, good arm swing, smooth turning,  able to perform tiptoe, and heel walking without difficulty.   Romberg signs: Negative  Deep tendon reflexes: Brachioradialis 2/2, biceps 2/2, triceps 2/2, patellar 2/2, Achilles 2/2, plantar responses were flexor bilaterally.   DIAGNOSTIC DATA (LABS, IMAGING, TESTING) - I reviewed patient records, labs, notes, testing and imaging myself where available.  Lab Results  Component Value Date   WBC 5.7 07/28/2012   HGB 12.8 07/28/2012   HCT 35.9 07/28/2012   MCV 89.4 07/28/2012   PLT 227 07/28/2012      Component Value Date/Time   NA 134* 07/28/2012 0941   NA 130* 06/17/2012 0550   K 4.3 07/28/2012 0941   K 4.6 06/17/2012 0550   CL 95* 07/28/2012 0941   CL 92* 06/17/2012 0550   CO2 30* 07/28/2012 0941   CO2 24 06/17/2012 0550   GLUCOSE 108* 07/28/2012 0941   GLUCOSE 135* 06/17/2012 0550   BUN 16.3 07/28/2012 0941   BUN 12 06/17/2012 0550   CREATININE 0.8 07/28/2012 0941   CREATININE 0.48* 06/17/2012 0550   CALCIUM 9.4 07/28/2012 0941   CALCIUM 10.1 06/17/2012 0550   PROT 6.9 07/28/2012 0941   PROT 7.4 06/16/2012 1530   ALBUMIN 3.7 07/28/2012 0941   ALBUMIN 3.9 06/16/2012 1530   AST 22 07/28/2012 0941   AST 22 06/16/2012 1530   ALT 29 07/28/2012 0941   ALT 19 06/16/2012 1530   ALKPHOS 81 07/28/2012 0941   ALKPHOS 78 06/16/2012 1530   BILITOT 1.27* 07/28/2012 0941   BILITOT 0.7 06/16/2012 1530   GFRNONAA >90 06/17/2012 0550   GFRAA >90 06/17/2012 0550   Lab Results  Component Value Date   CHOL 194 04/06/2012   HDL 55.10 04/06/2012   LDLCALC 110* 04/06/2012   LDLDIRECT 88.1 12/04/2010   TRIG 143.0 04/06/2012   CHOLHDL 4 04/06/2012   Lab Results  Component Value Date   HGBA1C 6.1* 08/07/2010   Lab Results  Component Value Date   VITAMINB12 >1500 pg/mL* 01/02/2010   Lab Results  Component Value  Date   TSH 1.33 04/06/2012     ASSESSMENT AND PLAN   77 years old right-handed Caucasian female, with past medical history of coronary artery disease, hypertension hyperlipidemia, history of migraine headaches,  now  presenting with frequent headaches for 6 months, normal neurological examination, CT the brain, MRI of cervical showed mild degenerative disc disease but no significant canal or foraminal stenosis  1. Differentiation diagnosis including migraine variants, tension headaches, 2.  C-reactive protein ESR to rule out temporal arteritis 3.  nortriptyline 10 mg, titrating to 20 mg every night at headache prevention 4. Morbic as needed for abortive treatment.   5. RTC with Eber Jones in 3 months   Levert Feinstein, M.D. Ph.D.  [facility] [address], [address] [place], [place] [address] [phone]

## 2012-11-07 LAB — SEDIMENTATION RATE: Sed Rate: 6 mm/hr (ref 0–40)

## 2012-11-08 NOTE — Progress Notes (Signed)
Quick Note:  Please call patient, slightly elevated CRP of unknown clinical significance. ______

## 2012-11-09 ENCOUNTER — Telehealth: Payer: Self-pay | Admitting: Gastroenterology

## 2012-11-09 ENCOUNTER — Telehealth: Payer: Self-pay | Admitting: *Deleted

## 2012-11-09 MED ORDER — DICYCLOMINE HCL 10 MG PO CAPS: 10.0000 mg | ORAL_CAPSULE | Freq: Three times a day (TID) | ORAL | Status: DC

## 2012-11-09 NOTE — Telephone Encounter (Signed)
I explained to the patient that we were sent a refill request from her pharmacy for dicyclomine, but if she does not need it she can leave it there on file. She verbalized understanding.

## 2012-11-09 NOTE — Telephone Encounter (Signed)
Mardella Layman, MD at 11/04/2012 11:29 AM   Status: Signed                Bentyl 10 mg. q 6-8h prn   Rx sent

## 2012-11-10 NOTE — Progress Notes (Signed)
Quick Note:  I called pt and gave her the results of labs. She had questions about the medication prescribed and the SE scared her. I told her that nortriptyline 10mg  is low dose starting and if helps headache that is great. All meds have SE and each person needs to try to see if med works with no SE. She would think more on this and may give it a try. ______

## 2012-11-16 NOTE — Telephone Encounter (Signed)
Bentyl sent to Pharmacy by Tresa Endo CMA.

## 2012-11-18 DIAGNOSIS — M5030 Other cervical disc degeneration, unspecified cervical region: Secondary | ICD-10-CM | POA: Diagnosis not present

## 2012-11-18 DIAGNOSIS — M9981 Other biomechanical lesions of cervical region: Secondary | ICD-10-CM | POA: Diagnosis not present

## 2012-11-18 DIAGNOSIS — J309 Allergic rhinitis, unspecified: Secondary | ICD-10-CM | POA: Diagnosis not present

## 2012-11-19 DIAGNOSIS — M542 Cervicalgia: Secondary | ICD-10-CM | POA: Diagnosis not present

## 2012-11-19 DIAGNOSIS — G43909 Migraine, unspecified, not intractable, without status migrainosus: Secondary | ICD-10-CM | POA: Diagnosis not present

## 2012-11-19 DIAGNOSIS — M1990 Unspecified osteoarthritis, unspecified site: Secondary | ICD-10-CM | POA: Diagnosis not present

## 2012-11-27 DIAGNOSIS — M5030 Other cervical disc degeneration, unspecified cervical region: Secondary | ICD-10-CM | POA: Diagnosis not present

## 2012-11-27 DIAGNOSIS — M9981 Other biomechanical lesions of cervical region: Secondary | ICD-10-CM | POA: Diagnosis not present

## 2012-12-01 DIAGNOSIS — Z23 Encounter for immunization: Secondary | ICD-10-CM | POA: Diagnosis not present

## 2012-12-01 DIAGNOSIS — J309 Allergic rhinitis, unspecified: Secondary | ICD-10-CM | POA: Diagnosis not present

## 2012-12-08 DIAGNOSIS — M9981 Other biomechanical lesions of cervical region: Secondary | ICD-10-CM | POA: Diagnosis not present

## 2012-12-08 DIAGNOSIS — M5030 Other cervical disc degeneration, unspecified cervical region: Secondary | ICD-10-CM | POA: Diagnosis not present

## 2012-12-15 ENCOUNTER — Other Ambulatory Visit: Payer: Self-pay

## 2012-12-15 DIAGNOSIS — J309 Allergic rhinitis, unspecified: Secondary | ICD-10-CM | POA: Diagnosis not present

## 2012-12-15 MED ORDER — ISOSORBIDE MONONITRATE ER 30 MG PO TB24: 30.0000 mg | ORAL_TABLET | Freq: Every day | ORAL | Status: DC

## 2012-12-22 DIAGNOSIS — M899 Disorder of bone, unspecified: Secondary | ICD-10-CM | POA: Diagnosis not present

## 2012-12-22 DIAGNOSIS — Z803 Family history of malignant neoplasm of breast: Secondary | ICD-10-CM | POA: Diagnosis not present

## 2012-12-22 DIAGNOSIS — Z853 Personal history of malignant neoplasm of breast: Secondary | ICD-10-CM | POA: Diagnosis not present

## 2012-12-23 DIAGNOSIS — M5030 Other cervical disc degeneration, unspecified cervical region: Secondary | ICD-10-CM | POA: Diagnosis not present

## 2012-12-23 DIAGNOSIS — M9981 Other biomechanical lesions of cervical region: Secondary | ICD-10-CM | POA: Diagnosis not present

## 2012-12-24 ENCOUNTER — Encounter (INDEPENDENT_AMBULATORY_CARE_PROVIDER_SITE_OTHER): Payer: Self-pay | Admitting: Surgery

## 2012-12-24 ENCOUNTER — Ambulatory Visit (INDEPENDENT_AMBULATORY_CARE_PROVIDER_SITE_OTHER): Payer: Medicare Other | Admitting: Surgery

## 2012-12-24 VITALS — BP 140/89 | HR 64 | Temp 98.1°F | Resp 12 | Ht <= 58 in | Wt 135.4 lb

## 2012-12-24 DIAGNOSIS — Z853 Personal history of malignant neoplasm of breast: Secondary | ICD-10-CM | POA: Diagnosis not present

## 2012-12-24 NOTE — Patient Instructions (Signed)
Continued annual mammograms and followups in this office 

## 2012-12-24 NOTE — Progress Notes (Signed)
NAME: TERSA FOTOPOULOS       DOB: [birth year]           DATE: 12/24/2012       MRN: [id number]   [name] is a 77 y.o.Marland Kitchenfemale who presents for routine followup of her Left breast cancer,IDC, receptor+, lower outer quadrant diagnosed in October,  2012 and treated with Lumpectomy, radiation and now letrozole. She has no problems or concerns on either side.Still a little tender on side of cancer (left)  PFSH: She has had no significant changes since the last visit here.  ROS: There have been no significant changes since the last visit here  EXAM: General: The patient is alert, oriented, generally healthy appearing, NAD. Mood and affect are normal.  Breasts:  Right is normal. Left shows minor radiation changes. The breast is a little bit pale and the nipple is somewhat flattened. The lumpectomy site is soft.  Lymphatics: She has no axillary or supraclavicular adenopathy on either side.  Extremities: Full ROM of the surgical side with no lymphedema noted.  Data Reviewed: Mammogram October, 2013 at [facility] was negative.Current was done two days ago, verbal report is it was OK  Impression: Doing well, with no evidence of recurrent cancer or new cancer  Plan: RTC12 months

## 2012-12-30 DIAGNOSIS — J309 Allergic rhinitis, unspecified: Secondary | ICD-10-CM | POA: Diagnosis not present

## 2013-01-05 ENCOUNTER — Ambulatory Visit: Payer: Medicare Other | Admitting: Pulmonary Disease

## 2013-01-05 DIAGNOSIS — M9981 Other biomechanical lesions of cervical region: Secondary | ICD-10-CM | POA: Diagnosis not present

## 2013-01-05 DIAGNOSIS — M5030 Other cervical disc degeneration, unspecified cervical region: Secondary | ICD-10-CM | POA: Diagnosis not present

## 2013-01-13 DIAGNOSIS — J309 Allergic rhinitis, unspecified: Secondary | ICD-10-CM | POA: Diagnosis not present

## 2013-01-26 DIAGNOSIS — M5030 Other cervical disc degeneration, unspecified cervical region: Secondary | ICD-10-CM | POA: Diagnosis not present

## 2013-01-26 DIAGNOSIS — M9981 Other biomechanical lesions of cervical region: Secondary | ICD-10-CM | POA: Diagnosis not present

## 2013-01-28 DIAGNOSIS — J309 Allergic rhinitis, unspecified: Secondary | ICD-10-CM | POA: Diagnosis not present

## 2013-02-03 ENCOUNTER — Ambulatory Visit (INDEPENDENT_AMBULATORY_CARE_PROVIDER_SITE_OTHER): Payer: Medicare Other | Admitting: Pulmonary Disease

## 2013-02-03 ENCOUNTER — Encounter: Payer: Self-pay | Admitting: Pulmonary Disease

## 2013-02-03 VITALS — BP 140/80 | HR 66 | Temp 98.0°F | Ht 60.0 in | Wt 136.8 lb

## 2013-02-03 DIAGNOSIS — M542 Cervicalgia: Secondary | ICD-10-CM

## 2013-02-03 DIAGNOSIS — J309 Allergic rhinitis, unspecified: Secondary | ICD-10-CM

## 2013-02-03 DIAGNOSIS — J45909 Unspecified asthma, uncomplicated: Secondary | ICD-10-CM

## 2013-02-03 DIAGNOSIS — I10 Essential (primary) hypertension: Secondary | ICD-10-CM

## 2013-02-03 DIAGNOSIS — C50512 Malignant neoplasm of lower-outer quadrant of left female breast: Secondary | ICD-10-CM

## 2013-02-03 DIAGNOSIS — R51 Headache: Secondary | ICD-10-CM

## 2013-02-03 DIAGNOSIS — K5730 Diverticulosis of large intestine without perforation or abscess without bleeding: Secondary | ICD-10-CM

## 2013-02-03 DIAGNOSIS — IMO0001 Reserved for inherently not codable concepts without codable children: Secondary | ICD-10-CM

## 2013-02-03 DIAGNOSIS — I2510 Atherosclerotic heart disease of native coronary artery without angina pectoris: Secondary | ICD-10-CM | POA: Diagnosis not present

## 2013-02-03 DIAGNOSIS — K219 Gastro-esophageal reflux disease without esophagitis: Secondary | ICD-10-CM

## 2013-02-03 DIAGNOSIS — E785 Hyperlipidemia, unspecified: Secondary | ICD-10-CM

## 2013-02-03 DIAGNOSIS — M1990 Unspecified osteoarthritis, unspecified site: Secondary | ICD-10-CM

## 2013-02-03 DIAGNOSIS — K589 Irritable bowel syndrome without diarrhea: Secondary | ICD-10-CM

## 2013-02-03 MED ORDER — CYCLOBENZAPRINE HCL 5 MG PO TABS: ORAL_TABLET | ORAL | Status: DC

## 2013-02-03 NOTE — Progress Notes (Signed)
Subjective:    Patient ID: [name], female    DOB: 1935-05-30, 77 y.o.   MRN: [id number]  HPI 77 y/o WF here for a follow up visit... she has multiple medical problems including AR & Asthma;  HBP;  CAD followed by Walker Kehr & s/p 3 vessel CABG 11/11 by DrGearhardt;  Hyperlipidemia followed in the [facility];  GERD/ IBS/ Divertics;  Breast Cancer diagnosed 12/12;  Fibromyalgia;  Chr HAs & dizziness...  ~  October 01, 2011:  43mo ROV & [name] persists w/ mult somatic complaints> eg.cramp under left breast ("knots up", thinks it's a hernia); intermittent right arm numbness & in the middle 3 fingers; she has chr fibromyalgia & uses Tramadol/ Tylenol as needed...    She saw DrVanWinkle 7/13 for her allergies> on allergy shots weekly, Xopenex inhaler as needed, Allegra prn, Nasonex daily and doing well...    She saw Cards 6/13 w/ known CAD, s/p CABG & intermittent CWP likely related to her FM; neg stress test- no ischemia, good LVF; she is walking some & notes "pressure" on hills...    She had f/u DrMagrinat 6/13 & he wondered if her systemic symptoms might not be related to Femara Rx...    We reviewed prob list, meds, xrays and labs> see below for updates >> requests Shingles vaccine Rx (lost prev Rx)... CXR 5/13 showed s/p CABG, clear lungs, NAD...  LABS 6/13:  Chems- ok;  CBC- wnl;  Ca27.29=20...  ~  March 31, 2012:  43mo ROV & [name] admits that she's not feeling well today- c/o occipHA w/ band-like extension around her head, feels funny- sl dizzy & light headed, occuring daily; she has Tramadol for prn use but she says she doesn't like taking it- prefers Midrin (refilled), offered Neuro/HA referral & she will consider it...     AR/ Asthma>  She stopped the Symbicort as well & not on any inhaled meds at present; denies cough, sput, ch in dyspnea, etc.    HBP>  controlled on Metop50Bid & Hyzaar100-12.5 w/ BP=144/72 today; denies visual changes, CP, palipit, dizziness, syncope, dyspnea, edema, etc...      CAD>  CP 11/11=> cath w/ in-stent restenosis, then CABG x3 by DrGearhardt; re-hospitalized 6/12 by Cards for CP> cath showed severe 3 vessel CAD w/ 3/3 grafts patent & norm LVF w/ EF= 65-70%; Myoview 5/13 showed no ischemia & EF>80%; no change in meds- on Imdur30; & DrNishan noted that med rx is lim by side effects.    Hyperlipid>  followed in the [facility] & FLP reviewed on Cres10- 2d per week but she was unable to tolerate even this low dose; off all meds on diet alone...    GI- GERD, Divertics> on Omep40; followed by DrPatterson & seen 9/13 f/u for ?diverticulitis- improved after Cipro/Flagyl but CTAbd was neg x diverticulosis; he rec high fiber diet & Citrucel...    Hx left breast cancer> she had f/u Oncology 12/13- back on Femara & tol ok; last mammogram 10/13 was ok as well...    Others>  she has persistant FM symptoms and chr HAs, uses Vicodin Prn... We reviewed prob list, meds, xrays and labs> see below for updates >> she had the 2013 Flu vaccine 10/13...  EKG 9/13 showed NSR, rate64, NSSTTWA, NAD...  CXR 10/13 showed heart at upper lim of norm, prior CABG, mild hyperinflation, clear, NAD... Abd Ultrasound 10/13 by Walker Kehr for eval CP showed> norm GB, liver, kidneys, & Ao- neg sonar... LABS 10-12/13 showed Chems- wnl;  CBC- wnl  LABS 2/14:  FLP- ok x LDL=110;  TSH=1.33   ~  Jul 03, 2012:  1mo ROV & post hospital visit> [facility] 4/22-23/14 after she presented w/ a pill (B-complex vit) aspiration and wheezing, SOB, asthma exac; she was able to cough it all out & did not require bronchoscopy; she denied dysphagia, n/v, etc;  Still not feeling well, min residual cough, notes some neck pain (XRay showed Cspine spondylosis) & she wants MRI=> referral to NS or whatever is necessary; we reviewed Rx w/ Tramadol, Robaxin as well... Her CC today sounds like reactive hypoglycemia & we reviewed diet adjustment- low glycemic index foods, more freq small meals if nec, etc...     We reviewed prob list, meds, xrays and  labs> see below for updates >>  CXR 4/14 showed norm heart size, s/p CABG, tort thor Ao, bilat apical pleural scarring, NAD.Marland KitchenMarland Kitchen  ~  September 29, 2012:  1mo ROV & [name] had her MRI CSpine 5/14> multilevel facet hypertrophy, foraminal narrowing & stenosis, disc osteophyte complexes, some central canal stenosis=> she was sent to NS, DrHirsh who rec Mobic & PT (helped alittle) but she was not pleased and is requesting a Rheumatology 2nd opinion about her neck discomfort; notes that insurance won't pay for musc relaxers...     She saw Walker Kehr for Cards 5/14> CAD, s/p CABG 2011, hx in-stent restenosis, unable to tol statins (even low dose intermittent rx); Myoview 5/13 was neg- no ischemia & EF>80%; no change in med rx...    She had f/u DrMagrinat 6/14> his note is reviewed- s/p lumpectomy, sentinel node, XRT; plan is to continue Femara2.5mg  thru 3/16 for 3yrs rx    She had GYN f/u 6/14 w/ [name]> doing satis x some dryness... We reviewed prob list, meds, xrays and labs> see below for updates >>   ~  February 03, 2013:  17mo ROV & [name] had Rheum consult [name]- last seen 9/14 & note reviewed> neck pain, this exac HAs/migraines, dizzy/ light headed; MRI=> NS consult & not a surg cand & NSAIDs/ PT w/ min benefit; saw Neurology- tried Pamelor but feels hung-over; for her DDD & OA they rec Flex5mg Tid which has helped some & she requests refill today- OK...     She saw [name] 9/14 after ER visit for HA> hx migraines and mixed HAs; CT Brain was neg & MRI CSpine showed multilevel DDD & some foraminal stenosis; Labs were neg including CRP & Sed; they tried Nortrip & Mobic but only min benefit...    She had routine f/u [name] 10/14> Hx left breast cancer dx 10/12 & treated w/ lumpectomy, XRT, & now Femara; exam neg, f/u mammogram neg, rec f/u 86yr... We reviewed prob list, meds, xrays and labs> see below for updates >> BP remains well controlled on her 3 meds; she had the 2014 flu vaccine 10/14; she continues on  allergy shots at the [facility]...           Problem List:  GLAUCOMA (ICD-365.9) - she is sched for right cataract surg by DrBevis in Feb...  ALLERGIC RHINITIS (ICD-477.9) - she uses Allegra & Nasonex Prn; on Allergy shots weekly x yrs... ~  She is followed by DrVanWinkle at the [facility]=> she remains on allergy shots...  ASTHMA & Asthmatic Bronchitis >> off Symbicort & using XOPENEX HFA as needed rescue inhaler... ~  5/11:  notes incr chest symptoms in the heat & rec to take the Symbicort 2sp Bid... ~  2012:  After her CABG & repeat hosp by Cards she is noted to be off her inhalers... ~  10/12:  Treated w/ Zpak, Pred, Mucinex, plus her Symbicort80 & improved... ~  CXR 5/13 showed s/p CABG, clear lungs, NAD (she went to the Er w/ cough & had neg eval). ~  7/13:  DrVanWinkle stopped her Symbicort & switched to [facility] HFA as needed... ~  2/14: she does not list any inhaled meds at present & states breathing OK w/o exac... ~  4/14: she was hosp after pill asp w/ asthma exac & improved w/ supportive Rx, didn't require bronch, CXR was clear, grad improved post hosp... ~  CXR 4/14 showed normal heart size, s/p CABG, sl tort Ao, bilat apic pleural scarring, NAD...   HYPERTENSION (ICD-401.9) - controlled on TOPROL 50mg Bid & HYZAAR 100-12.5 daily (off Lisinopril due to ACE cough)...  ~  8/12: BP=  128/84 & tol meds well; denies HA, fatigue, visual changes, CP, palipit, syncope, dyspnea, edema, etc... ~  2/13: BP= 140/64 & she remains essentially asymptomatic on Rx... ~  8/13:  BP= 132/64 & she denies CP, palpit, SOB, edema, etc... ~  2/14: controlled on Toprol & Hyzaar w/ BP 144/72 today; denies visual changes, CP, palipit, dizziness, syncope, dyspnea, edema, etc... ~  5/14:  BP= 136/60 & she remains stable... ~  8/14:  BP= 134/60 and she denies CP, palpit, dizzy, SOB, edema... ~  12/14: on Metop50Bid, Hyzaar 100-12.5, Imdur30;  BP= 140/80 & she remains  largely asymptomatic...  CORONARY ARTERY DISEASE (ICD-414.00) - on ASA 81mg /d (intol to 325 she says) & off Plavix per Walker Kehr...  Cardiolite 9/08 showed CP & HBP response, EF+85%... had cath 10/08 w/ single vessel dis w/ mod ostial LAD stenosis and patent LAD stent, normal LVF.[name]. option for med Rx vs off pump LIMA... she notes some CP/ pressure "burning" when walking up a certain hill during her daily walks, but states this is stable and "no worse"... ~  Myoview 9/09 was neg- no scar or ischemia, EF= 89%, hypertensive response- Lisinopril10 added. ~  f/u Myoview 3/11 was neg- no scar or ischemia, EF= 86%, norm BP response, fair exerc capacity. ~  saw Walker Kehr 5/11- no CP, he stopped her Plavix, stable- continue other meds same. ~  She had recurrent CP 11/11 w/ in-stent restenosis on cath> subseq 3 vessel CABG by DrGearhart & doing well since then... ~  6/12:  Hosp w/ CP & repeat cath showed CAD & 3/3 grafts patent, EF= 65-70% ~  Myoview 5/13 was felt to be a normal stress nuclear study- normal wall motion, no ischemia, EF=84% ~  2/14: CP 11/11=> cath w/ in-stent restenosis, then CABG x3 by DrGearhardt; re-hospitalized 6/12 by Cards for CP> cath showed severe 3 vessel CAD w/ 3/3 grafts patent & norm LVF w/ EF= 65-70%; Myoview 5/13 showed no ischemia & EF>80%; no change in meds; & Walker Kehr noted that med rx is limited by side effects... ~  8/14 & 12/14: she remains stable on Imdur30 + Metop50Bid 7 Hyzaar100-12.5; denies CP, palpit, SOB, etc...  HYPERLIPIDEMIA (ICD-272.4) - on CRESTOR 10mg - 1/2 on M&F, FISH OIL 1200mg  Tid + Flax Seed Oil... she has been intol to statins in the past>  tried low dose Trilipix but stopped this due to "thigh burning">  now followed in the [facility]--- ~  FLP 7/08 showed TChol 217, TG 236, HDL 46, LDL 150...  ~  FLP 6/09 showed TChol 233, TG 263, HDL 46, LDL 150... she agrees  to try LOW DOSE Trilipix. ~  FLP 11/09 showed TChol 162, TG 167, HDL 45, LDL 83... ~  FLP 11/10  showed TChol 240, TG 271, HDL 45, LDL 150... LipClin Rx w/ FishOil & FlaxSeedOil. ~  FLP 2/11 showed TChol 285, TG 156, HDL 79, LDL 180... LC is aware & working w/ her regularly. ~  FLP 5/11 showed TChol 277, TG 241, HDL55, LDL 193...  LC added Cres10 just 2d per week. ~  FLP 10/11 showed TChol 198, TG 215, HDL 53, LDL 109... much improved on Cres10 twice weekly. ~  She continues to f/u w/ Lipid clinic & they titrated up the Cres10 on M&F, plus 5mg  on Wednes> ~  FLP  10/12 was the best yet w/ TChol 167, TG 203, HDL 54, LDL 88...  ~  FLP 4/13 showed TChol 193, TG 176, HDL 57, LDL 101 ~  She is still followed in the [facility]- note 6/13 reviewed (at that time on Cres10 on M&F, 5mg  on W) but she subseq cut herself down to 1/2 on M&F due to nausea & aching. ~  FLP 2/14 on diet alone showed TChol 194, TG 143, HDL 55, LDL 110... We reviewed diet, exercise, etc... ~  She remains off the [place] on diet alone and she declines to restart low dose rx "I can't tolerate them"  GERD (ICD-530.81) - on OMEPRAZOLE 40mg /d... last EGD by DrPatterson 8/07 revealed sm HH, & gastritis and dilatation done... (RUT=neg, PPI Rx)... LEVSIN 0.125mg  helps her esoph spasm... Prn Phenergan for nausea.  DIVERTICULOSIS, COLON (ICD-562.10) & IRRITABLE BOWEL SYNDROME (ICD-564.1) ~  colonoscopy 2/03 by DrPatterson showed divertics and 5 mm polyp... ~  colonoscopy 3/11 showed divertics & cecal polyp= tubular adenoma, w/ f/u planned 76yrs. ~  9/13: seen by DrPatterson f/u for ?diverticulitis- improved after Cipro/Flagyl but CTAbd was neg x diverticulosis; he rec high fiber diet & Citrucel...  ? GALLSTONES>>  Diagnosed by DrPatterson and referred to [facility] DrWilson, pt asked to call prn fopr incr symptoms to consider surg... ~  Abd Ultrasound 10/13 by Walker Kehr for eval CP showed> norm GB, liver, kidneys, & Ao- neg sonar.  BREAST CANCER >> Abn mammogram 10/12 showing a left breast nodule & subseq surg (partial mastectomy & sentinel node bx) by  [name] 12/12 that proved to be an invasive ductal carcinoma, 2.3cm size, w/ neg lymphovasc invasion, clear margins (but close at 1mm), & neg nodes; she had XRT by DrKindard (had her 11th treatment today); and Oncology eval by DrMagrinat who plans hormone Rx later (no chemo she says)...  ~  2/13: She is tolerating treatment well & spirits are up, [name] had to aspirate a seroma & it is now resolved... ~  She is followed by DrMagrinat, Streck, Kinard- now on [facility] 2.5mg /d w/ 67yrs planned... ~  6/14:  She had f/u DrMagrinat 6/14> his note is reviewed- s/p lumpectomy, sentinel node, XRT; plan is to continue Femara2.5mg  thru 3/16 for 69yrs rx. ~  10/14:  She had routine f/u [name] 10/14> Hx left breast cancer dx 10/12 & treated w/ lumpectomy, XRT, & now Femara; exam neg, f/u mammogram neg, rec f/u 49yr.  DJD, Neck Pain >> ~  XRays Cspine 4/14 in hosp showed multilevel cerv spondylosis, 1-62mm anterolisthesis C4 on C5, DDD, foraminal stenosis at C5-6...  ~  5/14:  MRI Cspine => multilevel facet hypertrophy, foraminal narrowing & stenosis, disc osteophyte complexes, some central canal stenosis=> she was sent to NS, DrHirsh who rec Mobic & PT (helped alittle)  but she was not pleased... ~  8/14:  She is requesting a Rheumatology 2nd opinion about her neck discomfort=> seen by [facility]- neck pain, this exac HAs/migraines, dizzy/ light headed; MRI=> NS consult & not a surg cand & NSAIDs/ PT w/ min benefit; saw Neurology- tried Pamelor but feels hung-over; for her DDD & OA they rec Flex5mg Tid which has helped some...  FIBROMYALGIA (ICD-729.1) - she c/o chr fatigue, aching/ sore, on Tramadol & Tylenol, but most benefit from low dose Hydrocodone ~1/2 tab Prn... I have recommended an increase exercise program to her... ~  5/11:  Vit D level = 50 on 50000 u weekly by Rock Nephew, NP  HEADACHE (ICD-784.0) - eval at [facility] clinic in 2002, but she notes Rx didn't help... ~  3/11:  presented w/ muscle  contraction HA's> Rx Tramadol 50mg  Q6H Prn.  DIZZINESS, CHRONIC (ICD-780.4) - MRI 2/09 per Walker Kehr showed atrophy, sm vessel dis, NAD...  Health Maintenance: ~  GI:  DrPatterson & up to date on colon screening. ~  GYN:  yearly f/u w/ Rock Nephew, NP for PAP, Mammogram at [facility], ?last BMD. ~  Immunizations:  yearly Flu vaccines in fall, Pneumovax in 2007?, TDAP given 5/11...   Past Surgical History  Procedure Laterality Date  . Coronary angioplasty with stent placement      Stent 2007  . Open heart surgery      01/19/2010  . Coronary artery bypass graft    . Eye surgery      bilateral cataract removal,  . Breast lumpectomy  02/06/11    left  . Cardiac stints    . Glaucoma repair      Outpatient Encounter Prescriptions as of 02/03/2013  Medication Sig  . aspirin 81 MG tablet Take 81 mg by mouth daily.    . Coenzyme Q10 (COQ10) 100 MG CAPS Take 1 capsule by mouth daily.    . cyclobenzaprine (FLEXERIL) 5 MG tablet Take 1/2 to 1 tablet by mouth two times daily as needed  . fexofenadine (ALLEGRA) 180 MG tablet Take 180 mg by mouth daily.    . Glucosamine-Chondroit-Vit C-Mn (GLUCOSAMINE CHONDR 1500 COMPLX PO) Take 1 tablet by mouth 3 (three) times daily.    . hyoscyamine (ANASPAZ) 0.125 MG TBDP tablet PLACE 1 TABLET UNDER THE TONGUE EVERY 6 HOURS AS NEEDED.  [name] isosorbide mononitrate (IMDUR) 30 MG 24 hr tablet Take 1 tablet (30 mg total) by mouth daily.  [name] latanoprost (XALATAN) 0.005 % ophthalmic solution   . letrozole (FEMARA) 2.5 MG tablet TAKE 1 TABLET BY MOUTH ONCE DAILY  . losartan-hydrochlorothiazide (HYZAAR) 100-12.5 MG per tablet Take 1 tablet by mouth daily.  . metoprolol (LOPRESSOR) 50 MG tablet Take 1 tablet (50 mg total) by mouth 2 (two) times daily.  . mometasone (NASONEX) 50 MCG/ACT nasal spray 2 sprays by Nasal route daily.    . nitroGLYCERIN (NITROSTAT) 0.4 MG SL tablet Take one tablet  By mouth SL every 5 minutes for a total of 3 doses as needed for chest pain.   . Omega-3 Fatty Acids (FISH OIL) 1200 MG CAPS 2 tabs po bid   . omeprazole (PRILOSEC) 40 MG capsule take 1 capsule by mouth once daily 30 MINUTES BEFORE THE FIRST MEAL OF THE DAY  . vitamin B-12 (CYANOCOBALAMIN) 1000 MCG tablet Take 1,000 mcg by mouth daily.    . [DISCONTINUED] dicyclomine (BENTYL) 10 MG capsule Take 1 capsule (10 mg total) by mouth 4 (four) times daily -  before meals and at bedtime.  . [  DISCONTINUED] meloxicam (MOBIC) 7.5 MG tablet Take 7.5 mg by mouth 2 (two) times daily as needed for pain.  . [DISCONTINUED] nortriptyline (PAMELOR) 10 MG capsule Take 2 capsules (20 mg total) by mouth at bedtime. One po qhs xone week,  . [DISCONTINUED] ondansetron (ZOFRAN) 4 MG tablet Take 1 tablet (4 mg total) by mouth every 8 (eight) hours as needed for nausea.  . [DISCONTINUED] oxyCODONE-acetaminophen (PERCOCET/ROXICET) 5-325 MG per tablet Take 1-2 tablets by mouth every 6 (six) hours as needed for pain.  . [DISCONTINUED] travoprost, benzalkonium, (TRAVATAN) 0.004 % ophthalmic solution Place 1 drop into both eyes at bedtime.     Allergies  Allergen Reactions  . Choline Fenofibrate Other (See Comments)    REACTION: pt states INTOL to Trilipix w/ "thigh burning"  . Simvastatin Other (See Comments)    REACTION: pt states INTOL to STATINS \\T \ refuses to restart  . Hydrocodone     Nightmare after taking cough syrup w/hydrocodone  . Adhesive [Tape] Rash  . Ceclor [Cefaclor] Rash  . Clarithromycin Rash  . Codeine Nausea Only  . Doxycycline Rash  . Lisinopril Cough    Developed ACE cough...  . Penicillins Itching and Rash    At injection site  . Tobramycin-Dexamethasone Rash    Current Medications, Allergies, Past Medical History, Past Surgical History, Family History, and Social History were reviewed in [employer] record.    Review of Systems    See HPI - all other systems neg except as noted...  The patient complains of chest pain and headaches.  The  patient denies anorexia, fever, weight loss, weight gain, vision loss, decreased hearing, hoarseness, syncope, dyspnea on exertion, peripheral edema, prolonged cough, hemoptysis, abdominal pain, melena, hematochezia, severe indigestion/heartburn, hematuria, incontinence, muscle weakness, suspicious skin lesions, transient blindness, difficulty walking, depression, unusual weight change, abnormal bleeding, enlarged lymph nodes, and angioedema.   Objective:   Physical Exam    WD, WN, 77 y/o WF in NAD... GENERAL:  Alert & oriented; pleasant & cooperative... HEENT:  [place]/AT, EOM-wnl, PERRLA, EACs-clear, TMs-wnl, NOSE-clear, THROAT-clear & wnl. NECK:  Decr ROM; no JVD; normal carotid impulses w/o bruits; no thyromegaly or nodules palpated; no lymphadenopathy. CHEST:  Clear to P & A; without wheezes/ rales/ or rhonchi heard... HEART:  Regular Rhythm; without murmurs/ rubs/ or gallops detected... ABDOMEN:  Soft & nontender; normal bowel sounds; no organomegaly or masses palpated... EXT: without deformities, mild arthritic changes and +trigger points, no varicose veins/ venous insuffic/ or edema. NEURO:  CN's intact; motor testing normal; sensory testing normal; gait normal & balance OK. DERM:  No lesions noted; no rash etc...  RADIOLOGY DATA:  Reviewed in the EPIC EMR & discussed w/ the patient...  LABORATORY DATA:  Reviewed in the EPIC EMR & discussed w/ the patient...   Assessment & Plan:    Asthma/ AR>  Stable on allergy shots & XOPENEX as needed; no recent exac... Hx pill asp w/ asthma exac=> resolved...  HBP>  Controlled on Toprol & Hyzaar, continue same...  CAD>  Cath w/ patent grafts; had neg Nuclear study; continue same meds and f/u DrNishan...  CHOL>  Followed in the [facility] Clinic & intol to all meds- FLP looks better however on diet alone...  GI> GERD, Divertics, ?Gallstones>  Followed by [employer] & stones eval by DrWilson [facility]; odd that f/u sonar 9/13 did not show  stones...  BREAST CANCER>  on [facility] now> treated by [name], DrMagrinat, DrKinard & records reviewed...  FM/ HAs/ etc>  On Ultram,  Tylenol, etc... NECK PAIN> with multilevel spondylosis & eval by NS, DrHirsh but she was not pleased & is asking for Rheum eval...  Other medical issues as noted> on CoQ10, Glucosamine, Vit B12, Vit D, etc...   Patient's Medications  New Prescriptions   No medications on file  Previous Medications   ASPIRIN 81 MG TABLET    Take 81 mg by mouth daily.     COENZYME Q10 (COQ10) 100 MG CAPS    Take 1 capsule by mouth daily.     FEXOFENADINE (ALLEGRA) 180 MG TABLET    Take 180 mg by mouth daily.     GLUCOSAMINE-CHONDROIT-VIT C-MN (GLUCOSAMINE CHONDR 1500 COMPLX PO)    Take 1 tablet by mouth 3 (three) times daily.     HYOSCYAMINE (ANASPAZ) 0.125 MG TBDP TABLET    PLACE 1 TABLET UNDER THE TONGUE EVERY 6 HOURS AS NEEDED.   ISOSORBIDE MONONITRATE (IMDUR) 30 MG 24 HR TABLET    Take 1 tablet (30 mg total) by mouth daily.   LATANOPROST (XALATAN) 0.005 % OPHTHALMIC SOLUTION       LETROZOLE (FEMARA) 2.5 MG TABLET    TAKE 1 TABLET BY MOUTH ONCE DAILY   LOSARTAN-HYDROCHLOROTHIAZIDE (HYZAAR) 100-12.5 MG PER TABLET    Take 1 tablet by mouth daily.   METOPROLOL (LOPRESSOR) 50 MG TABLET    Take 1 tablet (50 mg total) by mouth 2 (two) times daily.   MOMETASONE (NASONEX) 50 MCG/ACT NASAL SPRAY    2 sprays by Nasal route daily.     NITROGLYCERIN (NITROSTAT) 0.4 MG SL TABLET    Take one tablet  By mouth SL every 5 minutes for a total of 3 doses as needed for chest pain.   OMEGA-3 FATTY ACIDS (FISH OIL) 1200 MG CAPS    2 tabs po bid    OMEPRAZOLE (PRILOSEC) 40 MG CAPSULE    take 1 capsule by mouth once daily 30 MINUTES BEFORE THE FIRST MEAL OF THE DAY   VITAMIN B-12 (CYANOCOBALAMIN) 1000 MCG TABLET    Take 1,000 mcg by mouth daily.    Modified Medications   Modified Medication Previous Medication   CYCLOBENZAPRINE (FLEXERIL) 5 MG TABLET cyclobenzaprine (FLEXERIL) 5 MG tablet       Take 1/2 to 1 tablet by mouth two times daily as needed    Take 1/2 to 1 tablet by mouth two times daily as needed  Discontinued Medications   DICYCLOMINE (BENTYL) 10 MG CAPSULE    Take 1 capsule (10 mg total) by mouth 4 (four) times daily -  before meals and at bedtime.   MELOXICAM (MOBIC) 7.5 MG TABLET    Take 7.5 mg by mouth 2 (two) times daily as needed for pain.   NORTRIPTYLINE (PAMELOR) 10 MG CAPSULE    Take 2 capsules (20 mg total) by mouth at bedtime. One po qhs xone week,   ONDANSETRON (ZOFRAN) 4 MG TABLET    Take 1 tablet (4 mg total) by mouth every 8 (eight) hours as needed for nausea.   OXYCODONE-ACETAMINOPHEN (PERCOCET/ROXICET) 5-325 MG PER TABLET    Take 1-2 tablets by mouth every 6 (six) hours as needed for pain.   TRAVOPROST, BENZALKONIUM, (TRAVATAN) 0.004 % OPHTHALMIC SOLUTION    Place 1 drop into both eyes at bedtime.

## 2013-02-03 NOTE — Patient Instructions (Signed)
Today we updated your med list in our EPIC system...    Continue your current medications the same...    We refilled your Flexeril per request...  Call for any questions...  Let's plan a follow up visit in 66mo, sooner if needed for problems.Marland KitchenMarland Kitchen

## 2013-02-09 DIAGNOSIS — M9981 Other biomechanical lesions of cervical region: Secondary | ICD-10-CM | POA: Diagnosis not present

## 2013-02-09 DIAGNOSIS — M5030 Other cervical disc degeneration, unspecified cervical region: Secondary | ICD-10-CM | POA: Diagnosis not present

## 2013-02-11 DIAGNOSIS — J309 Allergic rhinitis, unspecified: Secondary | ICD-10-CM | POA: Diagnosis not present

## 2013-02-16 ENCOUNTER — Ambulatory Visit: Payer: Medicare Other | Admitting: Nurse Practitioner

## 2013-02-23 DIAGNOSIS — M9981 Other biomechanical lesions of cervical region: Secondary | ICD-10-CM | POA: Diagnosis not present

## 2013-02-23 DIAGNOSIS — M5030 Other cervical disc degeneration, unspecified cervical region: Secondary | ICD-10-CM | POA: Diagnosis not present

## 2013-03-02 DIAGNOSIS — J309 Allergic rhinitis, unspecified: Secondary | ICD-10-CM | POA: Diagnosis not present

## 2013-03-03 ENCOUNTER — Other Ambulatory Visit: Payer: Self-pay | Admitting: Gastroenterology

## 2013-03-09 DIAGNOSIS — M9981 Other biomechanical lesions of cervical region: Secondary | ICD-10-CM | POA: Diagnosis not present

## 2013-03-09 DIAGNOSIS — M5030 Other cervical disc degeneration, unspecified cervical region: Secondary | ICD-10-CM | POA: Diagnosis not present

## 2013-03-17 ENCOUNTER — Encounter (INDEPENDENT_AMBULATORY_CARE_PROVIDER_SITE_OTHER): Payer: Self-pay

## 2013-03-17 ENCOUNTER — Ambulatory Visit (INDEPENDENT_AMBULATORY_CARE_PROVIDER_SITE_OTHER): Payer: Medicare Other | Admitting: Nurse Practitioner

## 2013-03-17 ENCOUNTER — Encounter: Payer: Self-pay | Admitting: Nurse Practitioner

## 2013-03-17 VITALS — BP 150/81 | HR 67 | Ht <= 58 in | Wt 137.0 lb

## 2013-03-17 DIAGNOSIS — R51 Headache: Secondary | ICD-10-CM

## 2013-03-17 DIAGNOSIS — J309 Allergic rhinitis, unspecified: Secondary | ICD-10-CM | POA: Diagnosis not present

## 2013-03-17 DIAGNOSIS — M542 Cervicalgia: Secondary | ICD-10-CM

## 2013-03-17 NOTE — Progress Notes (Signed)
[facility]  PATIENT: [name] DOB: 07/24/1935   REASON FOR VISIT:follow up for headache   HISTORY OF PRESENT ILLNESS: [name], 78 year old female returns for followup. She was evaluated by [name] 11/06/2012 for frequent headaches. CAT scan of the brain was normal, MRI of the cervical spine shows multilevel degenerative disc disease. She was placed on Pamelor by [name] and read the side effects of the medication and did not take it. Her C-reactive protein returned normal. In the meantime she has seen [name] who ordered Flexeril, she is only taking it when necessary but it is beneficial. She returns for reevaluation.    HISTORY: evaluation of headaches  She had a past medical history of coronary artery disease, status post a stent, hypertension, hyperlipidemia, she is taking beta blocker metoprolol 50 mg twice a day. she also has a history of breast cancer, status post left lobectomy followed by chemotherapy and radiation therapy in 2012  She reported a history of migraine headaches since age 34s, her typical migraine are occipital area bandlike, severe pounding headache with associated light noise sensitivity, clustered around her menstruation period of time, her headache has quieted down for many years, until about 6 months ago, she began to experience frequent headaches again, somewhat similar to the headache she had previously, bandlike starting from occipital,to bilateral temporal frontal region, pressure, with associated light noise sensitivity.  She reported 15 days out of a month, she would have moderate degree of headaches  She has tried ice and heating pad, which helped her headache some, she is also taking Percocet, Zofran as needed for nausea.  She had CAT scan of the brain that was normal, MRI of cervical spine showed multilevel degenerative disc disease, mild right foraminal stenosis at C3-4, secondary to asymmetric right-sided facet  hypertrophy, and joint disease,  She denies jaw claudication, no gait difficulty no diffuse muscle achy pain, no chewing or swallowing difficulties      REVIEW OF SYSTEMS: Full 14 system review of systems performed and notable only for those listed, all others are neg:  Constitutional: N/A  Cardiovascular: Palpitations Ear/Nose/Throat: N/A  Skin: N/A  Eyes: N/A  Respiratory: N/A  Gastroitestinal: N/A Genitourinary:  Urinary frequency Hematology/Lymphatic: N/A  Endocrine: N/A Musculoskeletal: Back pain, muscle cramps Allergy/Immunology: N/A  Neurological: Headache  Psychiatric: N/A   ALLERGIES: Allergies  Allergen Reactions  . Choline Fenofibrate Other (See Comments)    REACTION: pt states INTOL to Trilipix w/ "thigh burning"  . Simvastatin Other (See Comments)    REACTION: pt states INTOL to STATINS \\T \ refuses to restart  . Hydrocodone     Nightmare after taking cough syrup w/hydrocodone  . Adhesive [Tape] Rash  . Ceclor [Cefaclor] Rash  . Clarithromycin Rash  . Codeine Nausea Only  . Doxycycline Rash  . Lisinopril Cough    Developed ACE cough...  . Penicillins Itching and Rash    At injection site  . Tobramycin-Dexamethasone Rash    HOME MEDICATIONS: Outpatient Prescriptions Prior to Visit  Medication Sig Dispense Refill  . aspirin 81 MG tablet Take 81 mg by mouth daily.        . Coenzyme Q10 (COQ10) 100 MG CAPS Take 1 capsule by mouth daily.        . cyclobenzaprine (FLEXERIL) 5 MG tablet Take 1/2 to 1 tablet by mouth two times daily as needed  60 tablet  5  . fexofenadine (ALLEGRA) 180 MG tablet Take 180 mg by mouth daily.        [name]  Glucosamine-Chondroit-Vit C-Mn (GLUCOSAMINE CHONDR 1500 COMPLX PO) Take 1 tablet by mouth 3 (three) times daily.        . hyoscyamine (ANASPAZ) 0.125 MG TBDP tablet PLACE 1 TABLET UNDER THE TONGUE EVERY 6 HOURS AS NEEDED.  40 tablet  2  . isosorbide mononitrate (IMDUR) 30 MG 24 hr tablet Take 1 tablet (30 mg total) by mouth daily.   90 tablet  3  . latanoprost (XALATAN) 0.005 % ophthalmic solution       . letrozole (FEMARA) 2.5 MG tablet TAKE 1 TABLET BY MOUTH ONCE DAILY  30 tablet  0  . losartan-hydrochlorothiazide (HYZAAR) 100-12.5 MG per tablet Take 1 tablet by mouth daily.  30 tablet  6  . metoprolol (LOPRESSOR) 50 MG tablet Take 1 tablet (50 mg total) by mouth 2 (two) times daily.  60 tablet  5  . mometasone (NASONEX) 50 MCG/ACT nasal spray 2 sprays by Nasal route daily.        . nitroGLYCERIN (NITROSTAT) 0.4 MG SL tablet Take one tablet  By mouth SL every 5 minutes for a total of 3 doses as needed for chest pain.  25 tablet  4  . Omega-3 Fatty Acids (FISH OIL) 1200 MG CAPS 2 tabs po bid       . omeprazole (PRILOSEC) 40 MG capsule take 1 capsule by mouth once daily 30 MINUTES BEFORE THE FIRST MEAL OF THE DAY  30 capsule  1  . vitamin B-12 (CYANOCOBALAMIN) 1000 MCG tablet Take 1,000 mcg by mouth daily.         No facility-administered medications prior to visit.    PAST MEDICAL HISTORY: Past Medical History  Diagnosis Date  . Unspecified glaucoma   . Allergic rhinitis, cause unspecified   . Unspecified asthma(493.90)   . Unspecified essential hypertension   . Other and unspecified hyperlipidemia   . Gastritis   . Abdominal pain, left lower quadrant   . Diverticulosis of colon (without mention of hemorrhage)   . Irritable bowel syndrome   . Colonic polyp 04-27-2009    tubular adenoma  . Headache(784.0)   . Dizziness   . Breast cancer, Left 12/20/2010  . Gallstones   . CAD (coronary artery disease)     a. 01/19/2010 s/p CABG x 3, lima->lad, vg->diag, vg->om1;  b. 06/2011 :Lexi MV: EF 84%, No ischemia.  . Bronchitis     hx of  . Pneumonia   . Hiatal hernia   . Fibromyalgia   . Esophageal reflux     hiatal hernia  . Atrophic vaginitis   . GERD (gastroesophageal reflux disease)   . Hypercholesterolemia   . Breast cancer   . Glaucoma   . Cervical arthritis     PAST SURGICAL HISTORY: Past Surgical  History  Procedure Laterality Date  . Coronary angioplasty with stent placement      Stent 2007  . Open heart surgery      01/19/2010  . Coronary artery bypass graft    . Eye surgery      bilateral cataract removal,  . Breast lumpectomy  02/06/11    left  . Cardiac stints    . Glaucoma repair      FAMILY HISTORY: Family History  Problem Relation Age of Onset  . Heart disease Brother   . Cancer Brother     gland cancer  . Diabetes Sister   . Breast cancer Sister   . Stroke Mother   . Stroke Father     SOCIAL HISTORY: History  Social History  . Marital Status: Married    Spouse Name: Gildardo Griffes. Reaves    Number of Children: 2  . Years of Education: 12   Occupational History  . part time preschool teacher-retired    Social History Main Topics  . Smoking status: Never Smoker   . Smokeless tobacco: Never Used  . Alcohol Use: No  . Drug Use: No  . Sexual Activity: Yes    Birth Control/ Protection: Post-menopausal   Other Topics Concern  . Not on file   Social History Narrative   Patient lives at home with her husband Marcello Moores). Patient is retired. Patient  Has 12 th grade education.    Caffeine- sometimes- One cup of coffee.   Right handed.   Four granddaughters.     PHYSICAL EXAM  Filed Vitals:   03/17/13 1016  BP: 150/81  Pulse: 67  Height: 4' 9.01" (1.448 m)  Weight: 137 lb (62.143 kg)   Body mass index is 29.64 kg/(m^2).  Generalized: Well developed, in no acute distress  Head: normocephalic and atraumatic,. Oropharynx benign  Neck: Supple, no carotid bruits  Cardiac: Regular rate rhythm Musculoskeletal: No deformity   Neurological examination   Mentation: Alert oriented to time, place, history taking. Follows all commands speech and language fluent  Cranial nerve II-XII: Fundoscopic exam reveals sharp disc margins.Pupils were equal round reactive to light extraocular movements were full, visual field were full on confrontational test.  Facial sensation and strength were normal. hearing was intact to finger rubbing bilaterally. Uvula tongue midline. head turning and shoulder shrug were normal and symmetric.Tongue protrusion into cheek strength was normal. Motor: normal bulk and tone, full strength in the BUE, BLE,  No focal weakness Sensory: normal and symmetric to light touch, pinprick, and  vibration  Coordination: finger-nose-finger, heel-to-shin bilaterally, no dysmetria Reflexes: Brachioradialis 2/2, biceps 2/2, triceps 2/2, patellar 2/2, Achilles 2/2, plantar responses were flexor bilaterally. Gait and Station: Rising up from seated position without assistance, normal stance,  moderate stride, good arm swing, smooth turning, able to perform tiptoe, and heel walking without difficulty. Tandem gait is steady. No assistive device.  DIAGNOSTIC DATA (LABS, IMAGING, TESTING) - I reviewed patient records, labs, notes, testing and imaging myself where available.  Lab Results  Component Value Date   WBC 5.7 07/28/2012   HGB 12.8 07/28/2012   HCT 35.9 07/28/2012   MCV 89.4 07/28/2012   PLT 227 07/28/2012      Component Value Date/Time   NA 134* 07/28/2012 0941   NA 130* 06/17/2012 0550   K 4.3 07/28/2012 0941   K 4.6 06/17/2012 0550   CL 95* 07/28/2012 0941   CL 92* 06/17/2012 0550   CO2 30* 07/28/2012 0941   CO2 24 06/17/2012 0550   GLUCOSE 108* 07/28/2012 0941   GLUCOSE 135* 06/17/2012 0550   BUN 16.3 07/28/2012 0941   BUN 12 06/17/2012 0550   CREATININE 0.8 07/28/2012 0941   CREATININE 0.48* 06/17/2012 0550   CALCIUM 9.4 07/28/2012 0941   CALCIUM 10.1 06/17/2012 0550   PROT 6.9 07/28/2012 0941   PROT 7.4 06/16/2012 1530   ALBUMIN 3.7 07/28/2012 0941   ALBUMIN 3.9 06/16/2012 1530   AST 22 07/28/2012 0941   AST 22 06/16/2012 1530   ALT 29 07/28/2012 0941   ALT 19 06/16/2012 1530   ALKPHOS 81 07/28/2012 0941   ALKPHOS 78 06/16/2012 1530   BILITOT 1.27* 07/28/2012 0941   BILITOT 0.7 06/16/2012 1530   GFRNONAA >90 06/17/2012 0550  GFRAA >90 06/17/2012 0550     Lab Results  Component Value Date   CHOL 194 04/06/2012   HDL 55.10 04/06/2012   LDLCALC 110* 04/06/2012   LDLDIRECT 88.1 12/04/2010   TRIG 143.0 04/06/2012   CHOLHDL 4 04/06/2012    Lab Results  Component Value Date   TSH 1.33 04/06/2012    ASSESSMENT AND PLAN  78 y.o. year old female  has a past medical history of Unspecified asthma(493.90); Unspecified essential hypertension; Other and unspecified hyperlipidemia; Irritable bowel syndrome;  Headache(784.0); Dizziness;  CAD (coronary artery disease); Fibromyalgia; Esophageal reflux; Cervical arthritis. here followup for her headaches. CT of the brain was normal. She was placed on Pamelor by [name] but did not take the medication after reading the side effects. She was then placed on Flexeril by [name] and is taking the medication intermittently if she has a headache.   Continue Flexeril as directed by [name], this medication will prevent the headache from occurring if taken as directed Given a copy of neck exercises to perform  several times a day Followup 6-8 months [name], [facility], [facility], [facility] [address] [place], [place] [address] [phone]

## 2013-03-17 NOTE — Patient Instructions (Signed)
Flexeril as directed by Dr. Lenna Gilford Perform neck exercises several times a day Followup 6-8 months

## 2013-03-19 DIAGNOSIS — J309 Allergic rhinitis, unspecified: Secondary | ICD-10-CM | POA: Diagnosis not present

## 2013-03-23 ENCOUNTER — Ambulatory Visit (INDEPENDENT_AMBULATORY_CARE_PROVIDER_SITE_OTHER): Payer: Medicare Other | Admitting: Cardiovascular Disease

## 2013-03-23 ENCOUNTER — Encounter: Payer: Self-pay | Admitting: Cardiovascular Disease

## 2013-03-23 VITALS — BP 155/80 | HR 69 | Ht <= 58 in | Wt 134.0 lb

## 2013-03-23 DIAGNOSIS — E785 Hyperlipidemia, unspecified: Secondary | ICD-10-CM

## 2013-03-23 DIAGNOSIS — I10 Essential (primary) hypertension: Secondary | ICD-10-CM | POA: Diagnosis not present

## 2013-03-23 DIAGNOSIS — I2510 Atherosclerotic heart disease of native coronary artery without angina pectoris: Secondary | ICD-10-CM

## 2013-03-23 DIAGNOSIS — M9981 Other biomechanical lesions of cervical region: Secondary | ICD-10-CM | POA: Diagnosis not present

## 2013-03-23 DIAGNOSIS — M5412 Radiculopathy, cervical region: Secondary | ICD-10-CM | POA: Diagnosis not present

## 2013-03-23 NOTE — Patient Instructions (Signed)
Your physician wants you to follow-up in: 1 year with Dr. Nishan You will receive a reminder letter in the mail two months in advance. If you don't receive a letter, please call our office to schedule the follow-up appointment.  Your physician recommends that you continue on your current medications as directed. Please refer to the Current Medication list given to you today.  

## 2013-03-23 NOTE — Progress Notes (Signed)
Patient ID: [name], female   DOB: 07-21-1935, 78 y.o.   MRN: [id number] [name] is seen today post F/U for CAD with instent restenosis and CABG with Dr Servando Snare 01/19/10 . CRF;s include HTN and elevated lipids. Rx limited by side effects. Cough with ACE and myalgias with higher dose statins. She still has issues with gallstones. Prior to her CABG she had documented stones by [place] and has seen Dr [name] and [name]. She had been having issues with her BP meds but separating them in time seems to have solved this.   Myovue 07/15/11 reviewed and normal with no ischemia and EF over 80%  Breast CA stable  On hormone Rx and recent mamogram ok   Will have 56th wedding anniversary this year   ROS: Denies fever, malais, weight loss, blurry vision, decreased visual acuity, cough, sputum, SOB, hemoptysis, pleuritic pain, palpitaitons, heartburn, abdominal pain, melena, lower extremity edema, claudication, or rash.  All other systems reviewed and negative  General: Affect appropriate Healthy:  appears stated age 78: normal Neck supple with no adenopathy JVP normal no bruits no thyromegaly Lungs clear with no wheezing and good diaphragmatic motion Heart:  S1/S2 no murmur, no rub, gallop or click PMI normal Abdomen: benighn, BS positve, no tenderness, no AAA no bruit.  No HSM or HJR Distal pulses intact with no bruits No edema Neuro non-focal Skin warm and dry No muscular weakness   Current Outpatient Prescriptions  Medication Sig Dispense Refill  . aspirin 81 MG tablet Take 81 mg by mouth daily.        . Coenzyme Q10 (COQ10) 100 MG CAPS Take 1 capsule by mouth daily.        . cyclobenzaprine (FLEXERIL) 5 MG tablet Take 1/2 to 1 tablet by mouth two times daily as needed  60 tablet  5  . fexofenadine (ALLEGRA) 180 MG tablet Take 180 mg by mouth daily.        . Glucosamine-Chondroit-Vit C-Mn (GLUCOSAMINE CHONDR 1500 COMPLX PO) Take 1 tablet by mouth 3 (three) times daily.        .  hyoscyamine (ANASPAZ) 0.125 MG TBDP tablet PLACE 1 TABLET UNDER THE TONGUE EVERY 6 HOURS AS NEEDED.  40 tablet  2  . isosorbide mononitrate (IMDUR) 30 MG 24 hr tablet Take 1 tablet (30 mg total) by mouth daily.  90 tablet  3  . latanoprost (XALATAN) 0.005 % ophthalmic solution       . letrozole (FEMARA) 2.5 MG tablet TAKE 1 TABLET BY MOUTH ONCE DAILY  30 tablet  0  . losartan-hydrochlorothiazide (HYZAAR) 100-12.5 MG per tablet Take 1 tablet by mouth daily.  30 tablet  6  . metoprolol (LOPRESSOR) 50 MG tablet Take 1 tablet (50 mg total) by mouth 2 (two) times daily.  60 tablet  5  . mometasone (NASONEX) 50 MCG/ACT nasal spray 2 sprays by Nasal route daily.        . nitroGLYCERIN (NITROSTAT) 0.4 MG SL tablet Take one tablet  By mouth SL every 5 minutes for a total of 3 doses as needed for chest pain.  25 tablet  4  . Omega-3 Fatty Acids (FISH OIL) 1200 MG CAPS 2 tabs po bid       . omeprazole (PRILOSEC) 40 MG capsule take 1 capsule by mouth once daily 30 MINUTES BEFORE THE FIRST MEAL OF THE DAY  30 capsule  1  . vitamin B-12 (CYANOCOBALAMIN) 1000 MCG tablet Take 1,000 mcg by mouth daily.  No current facility-administered medications for this visit.    Allergies  Choline fenofibrate; Simvastatin; Hydrocodone; Adhesive; Ceclor; Clarithromycin; Codeine; Doxycycline; Lisinopril; Penicillins; and Tobramycin-dexamethasone  Electrocardiogram:  SR rate 69  nonspecfic ST/T wave chagne s  Assessment and Plan

## 2013-03-23 NOTE — Assessment & Plan Note (Signed)
Cholesterol is at goal.  Continue current dose of statin and diet Rx.  No myalgias or side effects.  F/U  LFT's in 6 months. Lab Results  Component Value Date   LDLCALC 110* 04/06/2012  Intol to statins

## 2013-03-23 NOTE — Assessment & Plan Note (Signed)
Well controlled.  Continue current medications and low sodium Dash type diet.    

## 2013-03-23 NOTE — Assessment & Plan Note (Signed)
Stable with no angina and good activity level.  Continue medical Rx Myovue 5/13 normal EF 80%

## 2013-03-29 ENCOUNTER — Other Ambulatory Visit: Payer: Self-pay | Admitting: *Deleted

## 2013-03-29 MED ORDER — METOPROLOL TARTRATE 50 MG PO TABS: 50.0000 mg | ORAL_TABLET | Freq: Two times a day (BID) | ORAL | Status: DC

## 2013-04-02 DIAGNOSIS — J309 Allergic rhinitis, unspecified: Secondary | ICD-10-CM | POA: Diagnosis not present

## 2013-04-06 DIAGNOSIS — M9981 Other biomechanical lesions of cervical region: Secondary | ICD-10-CM | POA: Diagnosis not present

## 2013-04-06 DIAGNOSIS — M5030 Other cervical disc degeneration, unspecified cervical region: Secondary | ICD-10-CM | POA: Diagnosis not present

## 2013-04-09 DIAGNOSIS — J309 Allergic rhinitis, unspecified: Secondary | ICD-10-CM | POA: Diagnosis not present

## 2013-04-26 DIAGNOSIS — J309 Allergic rhinitis, unspecified: Secondary | ICD-10-CM | POA: Diagnosis not present

## 2013-04-28 DIAGNOSIS — M5030 Other cervical disc degeneration, unspecified cervical region: Secondary | ICD-10-CM | POA: Diagnosis not present

## 2013-04-28 DIAGNOSIS — M9981 Other biomechanical lesions of cervical region: Secondary | ICD-10-CM | POA: Diagnosis not present

## 2013-04-29 DIAGNOSIS — J3089 Other allergic rhinitis: Secondary | ICD-10-CM | POA: Diagnosis not present

## 2013-04-29 DIAGNOSIS — J45909 Unspecified asthma, uncomplicated: Secondary | ICD-10-CM | POA: Diagnosis not present

## 2013-04-29 DIAGNOSIS — K219 Gastro-esophageal reflux disease without esophagitis: Secondary | ICD-10-CM | POA: Diagnosis not present

## 2013-04-29 DIAGNOSIS — L219 Seborrheic dermatitis, unspecified: Secondary | ICD-10-CM | POA: Diagnosis not present

## 2013-04-29 DIAGNOSIS — J301 Allergic rhinitis due to pollen: Secondary | ICD-10-CM | POA: Diagnosis not present

## 2013-05-05 DIAGNOSIS — J309 Allergic rhinitis, unspecified: Secondary | ICD-10-CM | POA: Diagnosis not present

## 2013-05-11 DIAGNOSIS — M9981 Other biomechanical lesions of cervical region: Secondary | ICD-10-CM | POA: Diagnosis not present

## 2013-05-11 DIAGNOSIS — M5030 Other cervical disc degeneration, unspecified cervical region: Secondary | ICD-10-CM | POA: Diagnosis not present

## 2013-05-17 ENCOUNTER — Other Ambulatory Visit: Payer: Self-pay | Admitting: *Deleted

## 2013-05-17 DIAGNOSIS — J309 Allergic rhinitis, unspecified: Secondary | ICD-10-CM | POA: Diagnosis not present

## 2013-05-17 MED ORDER — LOSARTAN POTASSIUM-HCTZ 100-12.5 MG PO TABS: 1.0000 | ORAL_TABLET | Freq: Every day | ORAL | Status: DC

## 2013-05-25 DIAGNOSIS — M9981 Other biomechanical lesions of cervical region: Secondary | ICD-10-CM | POA: Diagnosis not present

## 2013-05-25 DIAGNOSIS — M5030 Other cervical disc degeneration, unspecified cervical region: Secondary | ICD-10-CM | POA: Diagnosis not present

## 2013-05-26 ENCOUNTER — Other Ambulatory Visit: Payer: Self-pay | Admitting: Gastroenterology

## 2013-05-27 DIAGNOSIS — J309 Allergic rhinitis, unspecified: Secondary | ICD-10-CM | POA: Diagnosis not present

## 2013-06-02 DIAGNOSIS — J309 Allergic rhinitis, unspecified: Secondary | ICD-10-CM | POA: Diagnosis not present

## 2013-06-04 DIAGNOSIS — J309 Allergic rhinitis, unspecified: Secondary | ICD-10-CM | POA: Diagnosis not present

## 2013-06-08 DIAGNOSIS — M545 Low back pain, unspecified: Secondary | ICD-10-CM | POA: Diagnosis not present

## 2013-06-08 DIAGNOSIS — M999 Biomechanical lesion, unspecified: Secondary | ICD-10-CM | POA: Diagnosis not present

## 2013-06-09 DIAGNOSIS — J309 Allergic rhinitis, unspecified: Secondary | ICD-10-CM | POA: Diagnosis not present

## 2013-06-10 ENCOUNTER — Other Ambulatory Visit: Payer: Self-pay | Admitting: Gastroenterology

## 2013-06-10 DIAGNOSIS — L821 Other seborrheic keratosis: Secondary | ICD-10-CM | POA: Diagnosis not present

## 2013-06-10 DIAGNOSIS — L219 Seborrheic dermatitis, unspecified: Secondary | ICD-10-CM | POA: Diagnosis not present

## 2013-06-15 DIAGNOSIS — J309 Allergic rhinitis, unspecified: Secondary | ICD-10-CM | POA: Diagnosis not present

## 2013-06-18 NOTE — Telephone Encounter (Signed)
Please see Visit Info comments 

## 2013-06-22 DIAGNOSIS — M999 Biomechanical lesion, unspecified: Secondary | ICD-10-CM | POA: Diagnosis not present

## 2013-06-22 DIAGNOSIS — M545 Low back pain, unspecified: Secondary | ICD-10-CM | POA: Diagnosis not present

## 2013-06-22 DIAGNOSIS — J309 Allergic rhinitis, unspecified: Secondary | ICD-10-CM | POA: Diagnosis not present

## 2013-07-06 DIAGNOSIS — M999 Biomechanical lesion, unspecified: Secondary | ICD-10-CM | POA: Diagnosis not present

## 2013-07-06 DIAGNOSIS — M545 Low back pain, unspecified: Secondary | ICD-10-CM | POA: Diagnosis not present

## 2013-07-06 DIAGNOSIS — M5137 Other intervertebral disc degeneration, lumbosacral region: Secondary | ICD-10-CM | POA: Diagnosis not present

## 2013-07-08 DIAGNOSIS — J309 Allergic rhinitis, unspecified: Secondary | ICD-10-CM | POA: Diagnosis not present

## 2013-07-15 DIAGNOSIS — Z961 Presence of intraocular lens: Secondary | ICD-10-CM | POA: Diagnosis not present

## 2013-07-20 DIAGNOSIS — J309 Allergic rhinitis, unspecified: Secondary | ICD-10-CM | POA: Diagnosis not present

## 2013-07-26 ENCOUNTER — Other Ambulatory Visit: Payer: Self-pay | Admitting: *Deleted

## 2013-07-26 DIAGNOSIS — C50519 Malignant neoplasm of lower-outer quadrant of unspecified female breast: Secondary | ICD-10-CM

## 2013-07-27 ENCOUNTER — Other Ambulatory Visit (HOSPITAL_BASED_OUTPATIENT_CLINIC_OR_DEPARTMENT_OTHER): Payer: Medicare Other

## 2013-07-27 ENCOUNTER — Encounter: Payer: Self-pay | Admitting: Physician Assistant

## 2013-07-27 ENCOUNTER — Telehealth: Payer: Self-pay | Admitting: Oncology

## 2013-07-27 ENCOUNTER — Ambulatory Visit (HOSPITAL_BASED_OUTPATIENT_CLINIC_OR_DEPARTMENT_OTHER): Payer: Medicare Other | Admitting: Physician Assistant

## 2013-07-27 VITALS — BP 147/64 | HR 70 | Temp 97.8°F | Resp 18 | Ht <= 58 in | Wt 136.9 lb

## 2013-07-27 DIAGNOSIS — C50519 Malignant neoplasm of lower-outer quadrant of unspecified female breast: Secondary | ICD-10-CM

## 2013-07-27 DIAGNOSIS — M899 Disorder of bone, unspecified: Secondary | ICD-10-CM | POA: Diagnosis not present

## 2013-07-27 DIAGNOSIS — Z853 Personal history of malignant neoplasm of breast: Secondary | ICD-10-CM

## 2013-07-27 DIAGNOSIS — Z171 Estrogen receptor negative status [ER-]: Secondary | ICD-10-CM

## 2013-07-27 DIAGNOSIS — R232 Flushing: Secondary | ICD-10-CM | POA: Insufficient documentation

## 2013-07-27 DIAGNOSIS — T451X5A Adverse effect of antineoplastic and immunosuppressive drugs, initial encounter: Secondary | ICD-10-CM

## 2013-07-27 DIAGNOSIS — M8580 Other specified disorders of bone density and structure, unspecified site: Secondary | ICD-10-CM | POA: Insufficient documentation

## 2013-07-27 DIAGNOSIS — M949 Disorder of cartilage, unspecified: Secondary | ICD-10-CM

## 2013-07-27 DIAGNOSIS — M48061 Spinal stenosis, lumbar region without neurogenic claudication: Secondary | ICD-10-CM | POA: Diagnosis not present

## 2013-07-27 DIAGNOSIS — M549 Dorsalgia, unspecified: Secondary | ICD-10-CM | POA: Diagnosis not present

## 2013-07-27 DIAGNOSIS — M999 Biomechanical lesion, unspecified: Secondary | ICD-10-CM | POA: Diagnosis not present

## 2013-07-27 LAB — CBC WITH DIFFERENTIAL/PLATELET
BASO%: 0.8 % (ref 0.0–2.0)
BASOS ABS: 0.1 10*3/uL (ref 0.0–0.1)
EOS ABS: 0.3 10*3/uL (ref 0.0–0.5)
EOS%: 3.6 % (ref 0.0–7.0)
HCT: 37.7 % (ref 34.8–46.6)
HEMOGLOBIN: 12.8 g/dL (ref 11.6–15.9)
LYMPH%: 46.9 % (ref 14.0–49.7)
MCH: 29.8 pg (ref 25.1–34.0)
MCHC: 34 g/dL (ref 31.5–36.0)
MCV: 87.9 fL (ref 79.5–101.0)
MONO#: 0.8 10*3/uL (ref 0.1–0.9)
MONO%: 10.5 % (ref 0.0–14.0)
NEUT%: 38.2 % — ABNORMAL LOW (ref 38.4–76.8)
NEUTROS ABS: 2.7 10*3/uL (ref 1.5–6.5)
Platelets: 253 10*3/uL (ref 145–400)
RBC: 4.29 10*6/uL (ref 3.70–5.45)
RDW: 14.3 % (ref 11.2–14.5)
WBC: 7.2 10*3/uL (ref 3.9–10.3)
lymph#: 3.4 10*3/uL — ABNORMAL HIGH (ref 0.9–3.3)

## 2013-07-27 LAB — COMPREHENSIVE METABOLIC PANEL (CC13)
ALBUMIN: 4 g/dL (ref 3.5–5.0)
ALK PHOS: 82 U/L (ref 40–150)
ALT: 23 U/L (ref 0–55)
ANION GAP: 13 meq/L — AB (ref 3–11)
AST: 22 U/L (ref 5–34)
BUN: 12.7 mg/dL (ref 7.0–26.0)
CALCIUM: 9.5 mg/dL (ref 8.4–10.4)
CHLORIDE: 96 meq/L — AB (ref 98–109)
CO2: 23 meq/L (ref 22–29)
Creatinine: 0.7 mg/dL (ref 0.6–1.1)
GLUCOSE: 88 mg/dL (ref 70–140)
POTASSIUM: 4.6 meq/L (ref 3.5–5.1)
SODIUM: 132 meq/L — AB (ref 136–145)
TOTAL PROTEIN: 7.1 g/dL (ref 6.4–8.3)
Total Bilirubin: 1.25 mg/dL — ABNORMAL HIGH (ref 0.20–1.20)

## 2013-07-27 MED ORDER — LETROZOLE 2.5 MG PO TABS: 2.5000 mg | ORAL_TABLET | Freq: Every day | ORAL | Status: DC

## 2013-07-27 NOTE — Progress Notes (Signed)
ID: [name]   DOB: Jun 02, 1935  MR#: [id number]  QMG#:[id number]  PCP: Noralee Space, MD GYN: Kem Boroughs NP SU: Osborn Coho OTHER MD: Gery Pray, Christene Slates, Jenkins Rouge  CHIEF COMPLAINT:  Hx of Left  Breast Cancer (letrozole)   HISTORY OF PRESENT ILLNESS: [name] is a [place] woman originally referred by Dr. Marcelo Baldy for evaluation and treatment in the setting of newly diagnosed breast cancer.   The patient had routine screening mammography June 2011 which was unremarkable.  Repeat screening mammography December 11, 2010, at [facility], however, showed a possible abnormality in the left breast.  Additional imaging on October 18th showed an irregular, lobulated mass in the lower outer quadrant of the left breast which by ultrasound was irregular and hypoechoic.  It measured 2.5 cm by ultrasound.  The left axilla showed a benign-looking lymph node which did not appear to have changed compared to as seen in prior mammograms.   With this information, the patient underwent biopsy of the left breast mass October 25th, and this showed [phone]) an invasive mammary carcinoma with some lobular features but strongly E-cadherin positive, and so an invasive ductal carcinoma.  There was evidence of perineural invasion, although no definite evidence of angiolymphatic invasion.  The tumor had a CISH ratio of HER-2 to CEP17 signals of 1.15, indicating no amplification.  Estrogen receptor was positive at 100%, progesterone receptor was positive at 61%, and the proliferation marker was 72%.   With this information, the patient was referred to Dr. Margot Chimes, and bilateral breast MRIs were obtained December 24, 2010.  This showed the mass in the left breast to measure 1.7 cm, to be solitary, to be not contacting the pectoralis, and in addition, there was no suspicious finding in the right breast, no pathologically enlarged axillary or internal mammary chain lymph nodes.   Subsequent history  is as detailed below.   INTERVAL HISTORY: [name] returns alone today for followup of her left breast carcinoma. She has been doing well, and continues on letrozole with [name] tolerance. She has only occasional hot flashes, but nothing she considers problematic. She has mild fatigue. She has generalized muscle aches and pains which she attributes to arthritis, and these do not seem to have worsened on the letrozole. She has known degenerative disc disease in her neck which causes pain, but this is stable. She's had no increased vaginal dryness and denies vaginal bleeding. She has some soreness and tenderness in both breasts, but notes that this seems to be associated with increased activity. Her most recent mammogram in October was unremarkable, and she has noted no abnormalities in either breast.   REVIEW OF SYSTEMS: Otherwise, [name] has had no recent illnesses and denies any fevers, chills, or night sweats. She's had no rashes or skin changes and denies abnormal bruising or bleeding. Her appetite is fairly [name], and she denies any nausea or change in bowel or bladder habits. She's had no new cough, increased shortness of breath, chest pain, or palpitations. She denies abnormal headaches or dizziness. She's had no increased peripheral swelling.  A detailed review of systems is otherwise stable and noncontributory.   PAST MEDICAL HISTORY: Past Medical History  Diagnosis Date  . Unspecified glaucoma   . Allergic rhinitis, cause unspecified   . Unspecified asthma(493.90)   . Unspecified essential hypertension   . Other and unspecified hyperlipidemia   . Gastritis   . Abdominal pain, left lower quadrant   . Diverticulosis of colon (without mention of hemorrhage)   .  Irritable bowel syndrome   . Colonic polyp 04-27-2009    tubular adenoma  . Headache(784.0)   . Dizziness   . Breast cancer, Left 12/20/2010  . Gallstones   . CAD (coronary artery disease)     a. 01/19/2010 s/p CABG x 3, [name]->lad,  vg->diag, vg->om1;  b. 06/2011 :[facility] MV: EF 84%, No ischemia.  . Bronchitis     hx of  . Pneumonia   . Hiatal hernia   . Fibromyalgia   . Esophageal reflux     hiatal hernia  . Atrophic vaginitis   . GERD (gastroesophageal reflux disease)   . Hypercholesterolemia   . Breast cancer   . Glaucoma   . Cervical arthritis   Coronary artery disease status post open heart surgery under [facility] November 2011, with bypasses from the left internal mammary artery to the left anterior descending, and then reverse saphenous vein graft to the diagonal coronary artery.  The patient had previously had stents placed by Dr. Johnsie Cancel in 2007.  Other problems include a history of reflux esophagitis, hypertension, hyperlipidemia, rosacea and gallstones.  PAST SURGICAL HISTORY: Past Surgical History  Procedure Laterality Date  . Coronary angioplasty with stent placement      Stent 2007  . Open heart surgery      01/19/2010  . Coronary artery bypass graft    . Eye surgery      bilateral cataract removal,  . Breast lumpectomy  02/06/11    left  . Cardiac stints    . Glaucoma repair      FAMILY HISTORY Family History  Problem Relation Age of Onset  . Heart disease Brother   . Cancer Brother     gland cancer  . Diabetes Sister   . Breast cancer Sister   . Stroke Mother   . Stroke Father   The patient's father died at the age of 80 following a stroke.  The patient's mother died at 43 with a history of Alzheimer disease.  The patient had 2 sisters, one of whom developed breast cancer at the age of 49 and survives.  The patient has a brother who apparently had lymphoma.  He is also a survivor.  GYNECOLOGIC HISTORY:  (Reviewed 07/27/2013) Menarche age 78.  Menopause in the mid 1990s.  She never used hormone replacement.  She is a GX, P2.    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  Her husband, [name],  has worked as a Dealer and is still  very active in his shop.   Daughter, [name], lives in [place] and is a home school mom.  Daughter, [name],  lives in [place] and works as a Consulting civil engineer for the [employer].  The patient attends the [employer] in [place].   ADVANCED DIRECTIVES: Not in place  HEALTH MAINTENANCE:  (Updated 07/27/2013) History  Substance Use Topics  . Smoking status: Never Smoker   . Smokeless tobacco: Never Used  . Alcohol Use: No     Colonoscopy: 2011/[facility]  PAP: Not on file  Bone density: October 2014 at [facility], osteopenia with T score -1.4  Lipid panel: February 2014/[facility]  Allergies  Allergen Reactions  . Choline Fenofibrate Other (See Comments)    REACTION: pt states INTOL to Trilipix w/ "thigh burning"  . Simvastatin Other (See Comments)    REACTION: pt states INTOL to STATINS$RemoveBeforeD' \\T'[id number]$ \ refuses to restart  . Hydrocodone  Nightmare after taking cough syrup w/hydrocodone  . Adhesive [Tape] Rash  . Ceclor [Cefaclor] Rash  . Clarithromycin Rash  . Codeine Nausea Only  . Doxycycline Rash  . Lisinopril Cough    Developed ACE cough...  . Penicillins Itching and Rash    At injection site  . Tobramycin-Dexamethasone Rash    Current Outpatient Prescriptions  Medication Sig Dispense Refill  . aspirin 81 MG tablet Take 81 mg by mouth daily.        . Coenzyme Q10 (COQ10) 100 MG CAPS Take 1 capsule by mouth daily.        . cyclobenzaprine (FLEXERIL) 5 MG tablet Take 1/2 to 1 tablet by mouth two times daily as needed  60 tablet  5  . fexofenadine (ALLEGRA) 180 MG tablet Take 180 mg by mouth daily.        . fluocinonide (LIDEX) 0.05 % external solution       . fluticasone (CUTIVATE) 0.05 % cream 1 application 2 (two) times daily.       . Glucosamine-Chondroit-Vit C-Mn (GLUCOSAMINE CHONDR 1500 COMPLX PO) Take 1 tablet by mouth 3 (three) times daily.        . isosorbide mononitrate (IMDUR) 30 MG 24 hr tablet Take 1 tablet (30 mg total) by  mouth daily.  90 tablet  3  . ketoconazole (NIZORAL) 2 % shampoo       . latanoprost (XALATAN) 0.005 % ophthalmic solution Place 1 drop into both eyes at bedtime.       [name] letrozole (FEMARA) 2.5 MG tablet Take 1 tablet (2.5 mg total) by mouth daily.  90 tablet  3  . losartan-hydrochlorothiazide (HYZAAR) 100-12.5 MG per tablet Take 1 tablet by mouth daily.  30 tablet  6  . metoprolol (LOPRESSOR) 50 MG tablet Take 1 tablet (50 mg total) by mouth 2 (two) times daily.  60 tablet  5  . Omega-3 Fatty Acids (FISH OIL) 1200 MG CAPS 2 tabs po bid       . omeprazole (PRILOSEC) 40 MG capsule take 1 capsule by mouth once daily 30 MINUTES BEFORE THE FIRST MEAL OF THE DAY  30 capsule  1  . vitamin B-12 (CYANOCOBALAMIN) 1000 MCG tablet Take 1,000 mcg by mouth daily.        . hyoscyamine (ANASPAZ) 0.125 MG TBDP tablet PLACE 1 TABLET UNDER THE TONGUE EVERY 6 HOURS AS NEEDED.  40 tablet  2  . mometasone (NASONEX) 50 MCG/ACT nasal spray 2 sprays by Nasal route daily.        . nitroGLYCERIN (NITROSTAT) 0.4 MG SL tablet Take one tablet  By mouth SL every 5 minutes for a total of 3 doses as needed for chest pain.  25 tablet  4   No current facility-administered medications for this visit.    OBJECTIVE: Middle-aged white woman who appears comfortable and is in no acute distress Filed Vitals:   07/27/13 1019  BP: 147/64  Pulse: 70  Temp: 97.8 F (36.6 C)  Resp: 18     Body mass index is 28.62 kg/(m^2).    ECOG FS: 1 Filed Weights   07/27/13 1019  Weight: 136 lb 14.4 oz (62.097 kg)   Physical Exam: HEENT:  Sclerae anicteric.  Oropharynx clear, pink, and moist. No ulcerations. No evidence of candidiasis. Neck is supple, trachea midline. No thyromegaly.  NODES:  No cervical or supraclavicular lymphadenopathy palpated.  BREAST EXAM:  Right breast is unremarkable. Left breast is status post lumpectomy. No suspicious nodularities or skin  changes. No evidence of local recurrence. Axillae are benign bilaterally, with  no palpable lymphadenopathy. There is no tenderness to either breast on palpation today. LUNGS:  Clear to auscultation bilaterally.  No wheezes or rhonchi HEART:  Regular rate and rhythm. No murmur appreciated. ABDOMEN:  Soft, nontender.  Positive bowel sounds.  MSK:  No focal spinal tenderness to palpation. [name] range of motion bilaterally in the upper extremities. EXTREMITIES:  No peripheral edema.  No lymphedema in the left upper extremity. SKIN:  No visible rashes, excessive ecchymoses, or petechiae. No pallor. [name] skin turgor. NEURO:  Nonfocal. Well oriented.  Appropriate affect.    LAB RESULTS: Lab Results  Component Value Date   WBC 7.2 07/27/2013   NEUTROABS 2.7 07/27/2013   HGB 12.8 07/27/2013   HCT 37.7 07/27/2013   MCV 87.9 07/27/2013   PLT 253 07/27/2013      Chemistry      Component Value Date/Time   NA 132* 07/27/2013 1008   NA 130* 06/17/2012 0550   K 4.6 07/27/2013 1008   K 4.6 06/17/2012 0550   CL 95* 07/28/2012 0941   CL 92* 06/17/2012 0550   CO2 23 07/27/2013 1008   CO2 24 06/17/2012 0550   BUN 12.7 07/27/2013 1008   BUN 12 06/17/2012 0550   CREATININE 0.7 07/27/2013 1008   CREATININE 0.48* 06/17/2012 0550      Component Value Date/Time   CALCIUM 9.5 07/27/2013 1008   CALCIUM 10.1 06/17/2012 0550   ALKPHOS 82 07/27/2013 1008   ALKPHOS 78 06/16/2012 1530   AST 22 07/27/2013 1008   AST 22 06/16/2012 1530   ALT 23 07/27/2013 1008   ALT 19 06/16/2012 1530   BILITOT 1.25* 07/27/2013 1008   BILITOT 0.7 06/16/2012 1530      STUDIES:  Most recent bilateral mammogram at [facility] on 12/22/2012 was unremarkable.  Most recent bone density at [facility] on 12/22/2012 showed osteopenia with a T score of -1.4.   ASSESSMENT: 78 y.o. [place] woman   (1)  status post left lumpectomy and sentinel lymph node biopsy December of 2012 for a T2 N0, stage IIA invasive ductal carcinoma, grade 3, HER-2 not amplified, strongly estrogen and progesterone receptor positive with an elevated proliferation marker at  72%;   (2) Oncotype recurrence score of 21, in the intermediate range, indicating a distant disease recurrence rate of 13% within 10 years if the patient's only systemic adjuvant therapy was tamoxifen for 5 years.   (2)  She completed radiation in March of 2013 and started letrozole at that time.  PLAN: Overall, [name] appears to be doing very well, and I am making changes to her treatment plan at this time. She will continue on the letrozole, the plan being to continue for a total of 5 years (until March 2018). We will continue seeing her on an annual basis for repeat labs and physical exam. She is due for her next mammogram at [facility] in October 2015.  The above plan was reviewed in detail with [name] today. She voices her understanding and agreement with our plan, and will call with any changes or problems prior to her appointment here next June.   Micah Flesher, PA-C 07/27/2013

## 2013-07-27 NOTE — Telephone Encounter (Signed)
per pof to sch appt & mamma @ [facility]-pt given time & date  of appt  & location address-pt given copy of sch

## 2013-08-03 DIAGNOSIS — J309 Allergic rhinitis, unspecified: Secondary | ICD-10-CM | POA: Diagnosis not present

## 2013-08-10 DIAGNOSIS — M999 Biomechanical lesion, unspecified: Secondary | ICD-10-CM | POA: Diagnosis not present

## 2013-08-10 DIAGNOSIS — M545 Low back pain, unspecified: Secondary | ICD-10-CM | POA: Diagnosis not present

## 2013-08-11 ENCOUNTER — Ambulatory Visit (INDEPENDENT_AMBULATORY_CARE_PROVIDER_SITE_OTHER): Payer: Medicare Other | Admitting: Pulmonary Disease

## 2013-08-11 ENCOUNTER — Encounter: Payer: Self-pay | Admitting: Pulmonary Disease

## 2013-08-11 VITALS — BP 136/68 | HR 68 | Temp 97.4°F | Ht 60.0 in | Wt 137.6 lb

## 2013-08-11 DIAGNOSIS — I2510 Atherosclerotic heart disease of native coronary artery without angina pectoris: Secondary | ICD-10-CM

## 2013-08-11 DIAGNOSIS — M1990 Unspecified osteoarthritis, unspecified site: Secondary | ICD-10-CM

## 2013-08-11 DIAGNOSIS — E785 Hyperlipidemia, unspecified: Secondary | ICD-10-CM

## 2013-08-11 DIAGNOSIS — J45909 Unspecified asthma, uncomplicated: Secondary | ICD-10-CM

## 2013-08-11 DIAGNOSIS — IMO0001 Reserved for inherently not codable concepts without codable children: Secondary | ICD-10-CM

## 2013-08-11 DIAGNOSIS — K5730 Diverticulosis of large intestine without perforation or abscess without bleeding: Secondary | ICD-10-CM

## 2013-08-11 DIAGNOSIS — J309 Allergic rhinitis, unspecified: Secondary | ICD-10-CM

## 2013-08-11 DIAGNOSIS — M542 Cervicalgia: Secondary | ICD-10-CM

## 2013-08-11 DIAGNOSIS — I10 Essential (primary) hypertension: Secondary | ICD-10-CM | POA: Diagnosis not present

## 2013-08-11 DIAGNOSIS — R51 Headache: Secondary | ICD-10-CM

## 2013-08-11 DIAGNOSIS — K219 Gastro-esophageal reflux disease without esophagitis: Secondary | ICD-10-CM

## 2013-08-11 DIAGNOSIS — K589 Irritable bowel syndrome without diarrhea: Secondary | ICD-10-CM

## 2013-08-11 DIAGNOSIS — C50519 Malignant neoplasm of lower-outer quadrant of unspecified female breast: Secondary | ICD-10-CM

## 2013-08-11 DIAGNOSIS — K8020 Calculus of gallbladder without cholecystitis without obstruction: Secondary | ICD-10-CM

## 2013-08-11 NOTE — Progress Notes (Signed)
Subjective:    Patient ID: [name], female    DOB: 1935-10-01, 78 y.o.   MRN: [id number]  HPI 78 y/o WF here for a follow up visit... she has multiple medical problems including AR & Asthma;  HBP;  CAD followed by [name] & s/p 3 vessel CABG 11/11 by DrGearhardt;  Hyperlipidemia followed in the [facility];  GERD/ IBS/ Divertics;  Breast Cancer diagnosed 12/12;  Fibromyalgia;  Chr HAs & dizziness...  SEE PREV EPIC NOTES FOR OLDER DATA >>   ~  March 31, 2012:  85mo ROV & [name] admits that she's not feeling well today- c/o occipHA w/ band-like extension around her head, feels funny- sl dizzy & light headed, occuring daily; she has Tramadol for prn use but she says she doesn't like taking it- prefers Midrin (refilled), offered Neuro/HA referral & she will consider it...     AR/ Asthma>  She stopped the Symbicort as well & not on any inhaled meds at present; denies cough, sput, ch in dyspnea, etc.    HBP>  controlled on Metop50Bid & Hyzaar100-12.5 w/ BP=144/72 today; denies visual changes, CP, palipit, dizziness, syncope, dyspnea, edema, etc...     CAD>  CP 11/11=> cath w/ in-stent restenosis, then CABG x3 by DrGearhardt; re-hospitalized 6/12 by Cards for CP> cath showed severe 3 vessel CAD w/ 3/3 grafts patent & norm LVF w/ EF= 65-70%; Myoview 5/13 showed no ischemia & EF>80%; no change in meds- on Imdur30; & DrNishan noted that med rx is lim by side effects.    Hyperlipid>  followed in the [place] reviewed on Cres10- 2d per week but she was unable to tolerate even this low dose; off all meds on diet alone...    GI- GERD, Divertics> on Omep40; followed by DrPatterson & seen 9/13 f/u for ?diverticulitis- improved after Cipro/Flagyl but CTAbd was neg x diverticulosis; he rec high fiber diet & Citrucel...    Hx left breast cancer> she had f/u Oncology 12/13- back on Femara & tol ok; last mammogram 10/13 was ok as well...    Others>  she has persistant FM symptoms and chr HAs, uses Vicodin Prn... We  reviewed prob list, meds, xrays and labs> see below for updates >> she had the 2013 Flu vaccine 10/13...   EKG 9/13 showed NSR, rate64, NSSTTWA, NAD...   CXR 10/13 showed heart at upper lim of norm, prior CABG, mild hyperinflation, clear, NAD...  Abd Ultrasound 10/13 by [name] for eval CP showed> norm GB, liver, kidneys, & Ao- neg sonar...  LABS 10-12/13 showed Chems- wnl;  CBC- wnl  LABS 2/14:  FLP- ok x LDL=110;  TSH=1.33   ~  Jul 03, 2012:  6mo ROV & post hospital visit> [name] 4/22-23/14 after she presented w/ a pill (B-complex vit) aspiration and wheezing, SOB, asthma exac; she was able to cough it all out & did not require bronchoscopy; she denied dysphagia, n/v, etc;  Still not feeling well, min residual cough, notes some neck pain (XRay showed Cspine spondylosis) & she wants MRI=> referral to NS or whatever is necessary; we reviewed Rx w/ Tramadol, Robaxin as well... Her CC today sounds like reactive hypoglycemia & we reviewed diet adjustment- low glycemic index foods, more freq small meals if nec, etc...     We reviewed prob list, meds, xrays and labs> see below for updates >>   CXR 4/14 showed norm heart size, s/p CABG, tort thor Ao, bilat apical pleural scarring, NAD.Marland KitchenMarland Kitchen  ~  September 29, 2012:  109mo ROV & [name] had her MRI CSpine 5/14> multilevel facet hypertrophy, foraminal narrowing & stenosis, disc osteophyte complexes, some central canal stenosis=> she was sent to NS, DrHirsh who rec Mobic & PT (helped alittle) but she was not pleased and is requesting a Rheumatology 2nd opinion about her neck discomfort; notes that insurance won't pay for musc relaxers...     She saw [name] for Cards 5/14> CAD, s/p CABG 2011, hx in-stent restenosis, unable to tol statins (even low dose intermittent rx); Myoview 5/13 was neg- no ischemia & EF>80%; no change in med rx...    She had f/u DrMagrinat 6/14> his note is reviewed- s/p lumpectomy, sentinel node, XRT; plan is to continue Femara2.5mg  thru 3/16 for  59yrs rx    She had GYN f/u 6/14 w/ [name]> doing satis x some dryness... We reviewed prob list, meds, xrays and labs> see below for updates >>   ~  February 03, 2013:  67mo ROV & Pat had Rheum consult [name]- last seen 9/14 & note reviewed> neck pain, this exac HAs/migraines, dizzy/ light headed; MRI=> NS consult & not a surg cand & NSAIDs/ PT w/ min benefit; saw Neurology- tried Pamelor but feels hung-over; for her DDD & OA they rec Flex5mg Tid which has helped some & she requests refill today- OK...     She saw [name] 9/14 after ER visit for HA> hx migraines and mixed HAs; CT Brain was neg & MRI CSpine showed multilevel DDD & some foraminal stenosis; Labs were neg including CRP & Sed; they tried Nortrip & Mobic but only min benefit...    She had routine f/u [name] 10/14> Hx left breast cancer dx 10/12 & treated w/ lumpectomy, XRT, & now Femara; exam neg, f/u mammogram neg, rec f/u 44yr... We reviewed prob list, meds, xrays and labs> see below for updates >> BP remains well controlled on her 3 meds; she had the 2014 flu vaccine 10/14; she continues on allergy shots at the [facility]...  ~  August 11, 2013:  79mo ROV & [name] reports that she is stable overall just lack energy so she's eating extra protein to help w/ this she tells me... She has had numerous specialty follow up visits in the interval>>    She had an allergy f/u w/ DrVanWinkle 3/15> AR on allergy shots, Allegra, Flonase, Saline; she is stable w/o recent exacerbation...    Hx Asthma on XopenexHFA as needed; she has done well w/o asthma exac, cough, sput, wheezing, or SOB...     She had a Cards f/u w/ drNishan 1/15> HBP, CAD, HL; on ASA81, Metop50Bid, Hyzaar100-12.5, Imdur30; Hx neg Myoview 5/13 & doing satis- no changes made; BP today= 136/68 & she denies CP, palpit, SOB, dizzy, edema, etc...    Lipids controlled on diet alone (w/ FishOil & CoQ10); Last FLP was 2/14 showing TChol 194, TG 143, HDL 55, LDL 110; reminded of diet, exercise, &  need for Fasting blood work...    She has seen DrHawkes for rheum and DrHirsh for NS in the past> told to f/u prn only...    She had a Neuro f/u 1/15> HAs followed by [name]; neg CTBrain & DDD on MRI Cspine; she refused to take Pamelor & uses Tramadol/ Flexeril as needed; no change in meds needed...     She saw Oncology 6/15 for f/u of her left Breast Cancer> on Femara2.5 & tol well, s/p lumpectomy & sentinel node bx 12/12, followed by XRT finished 3/13, & on Letrozole since then,  no known recurrence...  We reviewed prob list, meds, xrays and labs> see below for updates >>            Problem List:  GLAUCOMA (ICD-365.9) - she is sched for right cataract surg by DrBevis in Feb...  ALLERGIC RHINITIS (ICD-477.9) - she uses Allegra & Nasonex Prn; on Allergy shots weekly x yrs... ~  She is followed by DrVanWinkle at the [facility]=> she remains on allergy shots...  ASTHMA & Asthmatic Bronchitis >> off Symbicort & using XOPENEX HFA as needed rescue inhaler... ~  5/11:  notes incr chest symptoms in the heat & rec to take the Symbicort 2sp Bid... ~  2012:  After her CABG & repeat hosp by Cards she is noted to be off her inhalers... ~  10/12:  Treated w/ Zpak, Pred, Mucinex, plus her Symbicort80 & improved... ~  CXR 5/13 showed s/p CABG, clear lungs, NAD (she went to the Er w/ cough & had neg eval). ~  7/13:  DrVanWinkle stopped her Symbicort & switched to [facility] HFA as needed... ~  2/14: she does not list any inhaled meds at present & states breathing OK w/o exac... ~  4/14: she was hosp after pill asp w/ asthma exac & improved w/ supportive Rx, didn't require bronch, CXR was clear, grad improved post hosp... ~  CXR 4/14 showed normal heart size, s/p CABG, sl tort Ao, bilat apic pleural scarring, NAD...  ~  6/15: she is stable w/o asthma exac and has avoided URIs/ bronchitis/ etc...  HYPERTENSION (ICD-401.9) - controlled on TOPROL 50mg Bid & HYZAAR 100-12.5 daily  (off Lisinopril due to ACE cough)...  ~  8/12: BP=  128/84 & tol meds well; denies HA, fatigue, visual changes, CP, palipit, syncope, dyspnea, edema, etc... ~  2/13: BP= 140/64 & she remains essentially asymptomatic on Rx... ~  8/13:  BP= 132/64 & she denies CP, palpit, SOB, edema, etc... ~  2/14: controlled on Toprol & Hyzaar w/ BP 144/72 today; denies visual changes, CP, palipit, dizziness, syncope, dyspnea, edema, etc... ~  5/14:  BP= 136/60 & she remains stable... ~  8/14:  BP= 134/60 and she denies CP, palpit, dizzy, SOB, edema... ~  12/14: on Metop50Bid, Hyzaar 100-12.5, Imdur30;  BP= 140/80 & she remains largely asymptomatic... ~  6/15: on ASA81, Metop50Bid, Hyzaar100-12.5, Imdur30; BP today= 136/68 & she denies CP, palpit, SOB, dizzy, edema, etc...  CORONARY ARTERY DISEASE (ICD-414.00) - on ASA 81mg /d (intol to 325 she says) & off Plavix per [name]...  Cardiolite 9/08 showed CP & HBP response, EF+85%... had cath 10/08 w/ single vessel dis w/ mod ostial LAD stenosis and patent LAD stent, normal LVF.[name]. option for med Rx vs off pump LIMA... she notes some CP/ pressure "burning" when walking up a certain hill during her daily walks, but states this is stable and "no worse"... ~  Myoview 9/09 was neg- no scar or ischemia, EF= 89%, hypertensive response- Lisinopril10 added. ~  f/u Myoview 3/11 was neg- no scar or ischemia, EF= 86%, norm BP response, fair exerc capacity. ~  saw [name] 5/11- no CP, he stopped her Plavix, stable- continue other meds same. ~  She had recurrent CP 11/11 w/ in-stent restenosis on cath> subseq 3 vessel CABG by DrGearhart & doing well since then... ~  6/12:  Hosp w/ CP & repeat cath showed CAD & 3/3 grafts patent, EF= 65-70% ~  Myoview 5/13 was felt to be a normal stress nuclear study- normal  wall motion, no ischemia, EF=84% ~  2/14: CP 11/11=> cath w/ in-stent restenosis, then CABG x3 by DrGearhardt; re-hospitalized 6/12 by Cards for CP> cath showed severe 3 vessel  CAD w/ 3/3 grafts patent & norm LVF w/ EF= 65-70%; Myoview 5/13 showed no ischemia & EF>80%; no change in meds; & [name] noted that med rx is limited by side effects... ~  8/14 & 12/14: she remains stable on Imdur30 + Metop50Bid 7 Hyzaar100-12.5; denies CP, palpit, SOB, etc... ~  6/15: Cards f/u w/ DrNishan 1/15> HBP, CAD, HL; on ASA81, Metop50Bid, Hyzaar100-12.5, Imdur30; Hx neg Myoview 5/13 & doing satis- no changes made; BP today= 136/68 & she denies CP, palpit, SOB, dizzy, edema, etc  HYPERLIPIDEMIA (ICD-272.4) - on CRESTOR 10mg - 1/2 on M&F, FISH OIL 1200mg  Tid + Flax Seed Oil... she has been intol to statins in the past>  tried low dose Trilipix but stopped this due to "thigh burning">  now followed in the [facility]id Clinic--- ~  [place] 7/08 showed TChol 217, TG 236, HDL 46, LDL 150...  ~  [place] 6/09 showed TChol 233, TG 263, HDL 46, LDL 150... she agrees to try LOW DOSE Trilipix. ~  FLP 11/09 showed TChol 162, TG 167, HDL 45, LDL 83... ~  [place] 11/10 showed TChol 240, TG 271, HDL 45, LDL 150... LipClin Rx w/ FishOil & FlaxSeedOil. ~  FLP 2/11 showed TChol 285, TG 156, HDL 79, LDL 180... LC is aware & working w/ her regularly. ~  [place] 5/11 showed TChol 277, TG 241, HDL55, LDL 193...  LC added Cres10 just 2d per week. ~  FLP 10/11 showed TChol 198, TG 215, HDL 53, LDL 109... much improved on Cres10 twice weekly. ~  She continues to f/u w/ Lipid clinic & they titrated up the Cres10 on M&F, plus 5mg  on Wednes> ~  FLP  10/12 was the best yet w/ TChol 167, TG 203, HDL 54, LDL 88...  ~  [place] 4/13 showed TChol 193, TG 176, HDL 57, LDL 101 ~  She is still followed in the [facility]- note 6/13 reviewed (at that time on Cres10 on M&F, 5mg  on W) but she subseq cut herself down to 1/2 on M&F due to nausea & aching. ~  FLP 2/14 on diet alone showed TChol 194, TG 143, HDL 55, LDL 110... We reviewed diet, exercise, etc... ~  She remains off the [place] on diet alone and she declines to restart low dose rx "I can't tolerate  them"  GERD (ICD-530.81) - on OMEPRAZOLE 40mg /d... last EGD by DrPatterson 8/07 revealed sm HH, & gastritis and dilatation done... (RUT=neg, PPI Rx)... LEVSIN 0.125mg  helps her esoph spasm... Prn Phenergan for nausea.  DIVERTICULOSIS, COLON (ICD-562.10) & IRRITABLE BOWEL SYNDROME (ICD-564.1) ~  colonoscopy 2/03 by DrPatterson showed divertics and 5 mm polyp... ~  colonoscopy 3/11 showed divertics & cecal polyp= tubular adenoma, w/ f/u planned 42yrs. ~  9/13: seen by DrPatterson f/u for ?diverticulitis- improved after Cipro/Flagyl but CTAbd was neg x diverticulosis; he rec high fiber diet & Citrucel...  ? GALLSTONES>>  Diagnosed by DrPatterson and referred to [facility] DrWilson, pt asked to call prn fopr incr symptoms to consider surg... ~  Abd Ultrasound 10/13 by [name] for eval CP showed> norm GB, liver, kidneys, & Ao- neg sonar.  BREAST CANCER >> Abn mammogram 10/12 showing a left breast nodule & subseq surg (partial mastectomy & sentinel node bx) by [name] 12/12 that proved to be an invasive ductal carcinoma, 2.3cm size, w/ neg lymphovasc  invasion, clear margins (but close at 80mm), & neg nodes; she had XRT by DrKindard (had her 11th treatment today); and Oncology eval by DrMagrinat who plans hormone Rx later (no chemo she says)...  ~  2/13: She is tolerating treatment well & spirits are up, [name] had to aspirate a seroma & it is now resolved... ~  She is followed by DrMagrinat, Streck, Kinard- now on [facility] 2.5mg /d w/ 27yrs planned... ~  6/14:  She had f/u DrMagrinat 6/14> his note is reviewed- s/p lumpectomy, sentinel node, XRT; plan is to continue Femara2.5mg  thru 3/16 for 29yrs rx. ~  10/14:  She had routine f/u [name] 10/14> Hx left breast cancer dx 10/12 & treated w/ lumpectomy, XRT, & now Femara; exam neg, f/u mammogram neg, rec f/u 24yr. ~  6/15:  She saw Oncology for f/u of her left Breast Cancer> on Femara2.5 & tol well, s/p lumpectomy & sentinel node bx 12/12, followed by XRT finished  3/13, & on Letrozole since then, no known recurrence...   DJD, Neck Pain >> ~  XRays Cspine 4/14 in hosp showed multilevel cerv spondylosis, 1-62mm anterolisthesis C4 on C5, DDD, foraminal stenosis at C5-6...  ~  5/14:  MRI Cspine => multilevel facet hypertrophy, foraminal narrowing & stenosis, disc osteophyte complexes, some central canal stenosis=> she was sent to NS, DrHirsh who rec Mobic & PT (helped alittle) but she was not pleased... ~  8/14:  She is requesting a Rheumatology 2nd opinion about her neck discomfort=> seen by [facility] [place]- neck pain, this exac HAs/migraines, dizzy/ light headed; MRI=> NS consult & not a surg cand & NSAIDs/ PT w/ min benefit; saw Neurology- tried Pamelor but feels hung-over; for her DDD & OA they rec Flex5mg Tid which has helped some...  FIBROMYALGIA (ICD-729.1) - she c/o chr fatigue, aching/ sore, on Tramadol & Tylenol, but most benefit from low dose Hydrocodone ~1/2 tab Prn... I have recommended an increase exercise program to her... ~  5/11:  Vit D level = 50 on 50000 u weekly by Helene Shoe, NP  HEADACHE (ICD-784.0) - eval at [facility] clinic in 2002, but she notes Rx didn't help... ~  3/11:  presented w/ muscle contraction HA's> Rx Tramadol 50mg  Q6H Prn.  DIZZINESS, CHRONIC (ICD-780.4) - MRI 2/09 per [name] showed atrophy, sm vessel dis, NAD...  Health Maintenance: ~  GI:  DrPatterson & up to date on colon screening. ~  GYN:  yearly f/u w/ Helene Shoe, NP for PAP, Mammogram at [facility], ?last BMD. ~  Immunizations:  yearly Flu vaccines in fall, Pneumovax in 2007?, TDAP given 5/11...   Past Surgical History  Procedure Laterality Date  . Coronary angioplasty with stent placement      Stent 2007  . Open heart surgery      01/19/2010  . Coronary artery bypass graft    . Eye surgery      bilateral cataract removal,  . Breast lumpectomy  02/06/11    left  . Cardiac stints    . Glaucoma repair      Outpatient Encounter Prescriptions as of 08/11/2013   Medication Sig  . aspirin 81 MG tablet Take 81 mg by mouth daily.    . Coenzyme Q10 (COQ10) 100 MG CAPS Take 1 capsule by mouth daily.    . cyclobenzaprine (FLEXERIL) 5 MG tablet Take 1/2 to 1 tablet by mouth two times daily as needed  . fexofenadine (ALLEGRA) 180 MG tablet Take 180 mg by mouth daily.    . fluocinonide (LIDEX) 0.05 %  external solution   . fluticasone (CUTIVATE) 0.93 % cream 1 application 2 (two) times daily.   . Glucosamine-Chondroit-Vit C-Mn (GLUCOSAMINE CHONDR 1500 COMPLX PO) Take 1 tablet by mouth 3 (three) times daily.    . hyoscyamine (ANASPAZ) 0.125 MG TBDP tablet PLACE 1 TABLET UNDER THE TONGUE EVERY 6 HOURS AS NEEDED.  [name] isosorbide mononitrate (IMDUR) 30 MG 24 hr tablet Take 1 tablet (30 mg total) by mouth daily.  [name] ketoconazole (NIZORAL) 2 % shampoo   . latanoprost (XALATAN) 0.005 % ophthalmic solution Place 1 drop into both eyes at bedtime.   [name] letrozole (FEMARA) 2.5 MG tablet Take 1 tablet (2.5 mg total) by mouth daily.  [name] losartan-hydrochlorothiazide (HYZAAR) 100-12.5 MG per tablet Take 1 tablet by mouth daily.  . metoprolol (LOPRESSOR) 50 MG tablet Take 1 tablet (50 mg total) by mouth 2 (two) times daily.  . mometasone (NASONEX) 50 MCG/ACT nasal spray 2 sprays by Nasal route daily.    . nitroGLYCERIN (NITROSTAT) 0.4 MG SL tablet Take one tablet  By mouth SL every 5 minutes for a total of 3 doses as needed for chest pain.  . Omega-3 Fatty Acids (FISH OIL) 1200 MG CAPS 2 tabs po bid   . omeprazole (PRILOSEC) 40 MG capsule take 1 capsule by mouth once daily 30 MINUTES BEFORE THE FIRST MEAL OF THE DAY  . vitamin B-12 (CYANOCOBALAMIN) 1000 MCG tablet Take 1,000 mcg by mouth daily.      Allergies  Allergen Reactions  . Choline Fenofibrate Other (See Comments)    REACTION: pt states INTOL to Trilipix w/ "thigh burning"  . Simvastatin Other (See Comments)    REACTION: pt states INTOL to STATINS \\T \ refuses to restart  . Hydrocodone     Nightmare after taking cough  syrup w/hydrocodone  . Adhesive [Tape] Rash  . Ceclor [Cefaclor] Rash  . Clarithromycin Rash  . Codeine Nausea Only  . Doxycycline Rash  . Lisinopril Cough    Developed ACE cough...  . Penicillins Itching and Rash    At injection site  . Tobramycin-Dexamethasone Rash    Current Medications, Allergies, Past Medical History, Past Surgical History, Family History, and Social History were reviewed in [employer] record.    Review of Systems    See HPI - all other systems neg except as noted...  The patient complains of chest pain and headaches.  The patient denies anorexia, fever, weight loss, weight gain, vision loss, decreased hearing, hoarseness, syncope, dyspnea on exertion, peripheral edema, prolonged cough, hemoptysis, abdominal pain, melena, hematochezia, severe indigestion/heartburn, hematuria, incontinence, muscle weakness, suspicious skin lesions, transient blindness, difficulty walking, depression, unusual weight change, abnormal bleeding, enlarged lymph nodes, and angioedema.   Objective:   Physical Exam    WD, WN, 78 y/o WF in NAD... GENERAL:  Alert & oriented; pleasant & cooperative... HEENT:  [place]/AT, EOM-wnl, PERRLA, EACs-clear, TMs-wnl, NOSE-clear, THROAT-clear & wnl. NECK:  Decr ROM; no JVD; normal carotid impulses w/o bruits; no thyromegaly or nodules palpated; no lymphadenopathy. CHEST:  Clear to P & A; without wheezes/ rales/ or rhonchi heard... HEART:  Regular Rhythm; without murmurs/ rubs/ or gallops detected... ABDOMEN:  Soft & nontender; normal bowel sounds; no organomegaly or masses palpated... EXT: without deformities, mild arthritic changes and +trigger points, no varicose veins/ venous insuffic/ or edema. NEURO:  CN's intact; motor testing normal; sensory testing normal; gait normal & balance OK. DERM:  No lesions noted; no rash etc...  RADIOLOGY DATA:  Reviewed in the EPIC EMR &  discussed w/ the patient...  LABORATORY DATA:   Reviewed in the EPIC EMR & discussed w/ the patient...   Assessment & Plan:    Asthma/ AR>  Stable on allergy shots & XOPENEX as needed; no recent exac... Hx pill asp w/ asthma exac=> resolved...  HBP>  Controlled on Toprol, Imdur & Hyzaar, continue same...  CAD>  Cath w/ patent grafts; had neg Nuclear study; continue same meds and f/u DrNishan...  CHOL>  Followed in the Lipid Clinic & intol to all meds- FLP looks better however on diet alone...  GI> GERD, Divertics, ?Gallstones>  Followed by [facility] & stones eval by DrWilson [facility]; odd that f/u sonar 9/13 did not show stones...  BREAST CANCER>  on FEMARA > treated by [name], DrMagrinat, DrKinard & records reviewed...  FM/ HAs/ etc>  On Ultram, Tylenol, etc... NECK PAIN> with multilevel spondylosis & eval by NS, DrHirsh but she was not pleased & is asking for Rheum eval...  Other medical issues as noted> on CoQ10, Glucosamine, Vit B12, Vit D, etc...   Patient's Medications  New Prescriptions   No medications on file  Previous Medications   ASPIRIN 81 MG TABLET    Take 81 mg by mouth daily.     COENZYME Q10 (COQ10) 100 MG CAPS    Take 1 capsule by mouth daily.     CYCLOBENZAPRINE (FLEXERIL) 5 MG TABLET    Take 1/2 to 1 tablet by mouth two times daily as needed   FEXOFENADINE (ALLEGRA) 180 MG TABLET    Take 180 mg by mouth daily.     FLUOCINONIDE (LIDEX) 0.05 % EXTERNAL SOLUTION       FLUTICASONE (CUTIVATE) 0.05 % CREAM    1 application 2 (two) times daily.    GLUCOSAMINE-CHONDROIT-VIT C-MN (GLUCOSAMINE CHONDR 1500 COMPLX PO)    Take 1 tablet by mouth 3 (three) times daily.     HYOSCYAMINE (ANASPAZ) 0.125 MG TBDP TABLET    PLACE 1 TABLET UNDER THE TONGUE EVERY 6 HOURS AS NEEDED.   ISOSORBIDE MONONITRATE (IMDUR) 30 MG 24 HR TABLET    Take 1 tablet (30 mg total) by mouth daily.   KETOCONAZOLE (NIZORAL) 2 % SHAMPOO       LATANOPROST (XALATAN) 0.005 % OPHTHALMIC SOLUTION    Place 1 drop into both eyes at bedtime.    LETROZOLE  (FEMARA) 2.5 MG TABLET    Take 1 tablet (2.5 mg total) by mouth daily.   LOSARTAN-HYDROCHLOROTHIAZIDE (HYZAAR) 100-12.5 MG PER TABLET    Take 1 tablet by mouth daily.   METOPROLOL (LOPRESSOR) 50 MG TABLET    Take 1 tablet (50 mg total) by mouth 2 (two) times daily.   MOMETASONE (NASONEX) 50 MCG/ACT NASAL SPRAY    2 sprays by Nasal route daily.     NITROGLYCERIN (NITROSTAT) 0.4 MG SL TABLET    Take one tablet  By mouth SL every 5 minutes for a total of 3 doses as needed for chest pain.   OMEGA-3 FATTY ACIDS (FISH OIL) 1200 MG CAPS    2 tabs po bid    OMEPRAZOLE (PRILOSEC) 40 MG CAPSULE    take 1 capsule by mouth once daily 30 MINUTES BEFORE THE FIRST MEAL OF THE DAY   VITAMIN B-12 (CYANOCOBALAMIN) 1000 MCG TABLET    Take 1,000 mcg by mouth daily.    Modified Medications   No medications on file  Discontinued Medications   No medications on file

## 2013-08-11 NOTE — Patient Instructions (Signed)
Today we updated your med list in our EPIC system...    Continue your current medications the same...  Call for any questions...  Let's plan a follow up visit in 6mo, sooner if needed for problems...   

## 2013-08-24 ENCOUNTER — Ambulatory Visit: Payer: Medicare Other | Admitting: Nurse Practitioner

## 2013-08-24 DIAGNOSIS — J309 Allergic rhinitis, unspecified: Secondary | ICD-10-CM | POA: Diagnosis not present

## 2013-08-24 DIAGNOSIS — M9981 Other biomechanical lesions of cervical region: Secondary | ICD-10-CM | POA: Diagnosis not present

## 2013-08-24 DIAGNOSIS — M5030 Other cervical disc degeneration, unspecified cervical region: Secondary | ICD-10-CM | POA: Diagnosis not present

## 2013-08-24 DIAGNOSIS — M5412 Radiculopathy, cervical region: Secondary | ICD-10-CM | POA: Diagnosis not present

## 2013-08-26 DIAGNOSIS — H4011X0 Primary open-angle glaucoma, stage unspecified: Secondary | ICD-10-CM | POA: Diagnosis not present

## 2013-08-31 ENCOUNTER — Encounter: Payer: Self-pay | Admitting: Nurse Practitioner

## 2013-08-31 ENCOUNTER — Ambulatory Visit (INDEPENDENT_AMBULATORY_CARE_PROVIDER_SITE_OTHER): Payer: Medicare Other | Admitting: Nurse Practitioner

## 2013-08-31 VITALS — BP 96/62 | HR 68 | Ht 58.4 in | Wt 136.0 lb

## 2013-08-31 DIAGNOSIS — Z01419 Encounter for gynecological examination (general) (routine) without abnormal findings: Secondary | ICD-10-CM

## 2013-08-31 DIAGNOSIS — Z124 Encounter for screening for malignant neoplasm of cervix: Secondary | ICD-10-CM

## 2013-08-31 NOTE — Patient Instructions (Signed)

## 2013-08-31 NOTE — Progress Notes (Signed)
Patient ID: [name], female   DOB: 07-Jul-1935, 78 y.o.   MRN: [id number] 78 y.o. [id number] Married Caucasian Fe here for annual exam.  Recent health issues with fatigue, and cough thought to be related to med's.  Her cousin who is few weeks younger is now at [facility] with heart failure and outcome looks bleak.  She is quite worried and concerned.  Patient's last menstrual period was 11/26/1990.          Sexually active: yes  The current method of family planning is post menopausal status.  Exercising: yes walking  Smoker: no   Health Maintenance:  Pap: 08/15/2011 negative  MMG: 12/22/12, benign Colonoscopy: 04/2009 polyp recheck in 5 years  BMD: 12/22/12, low bone mass  TDaP: 07/25/09 Labs: PCP and [facility]   reports that she has never smoked. She has never used smokeless tobacco. She reports that she does not drink alcohol or use illicit drugs.  Past Medical History  Diagnosis Date  . Unspecified glaucoma   . Allergic rhinitis, cause unspecified   . Unspecified asthma(493.90)   . Unspecified essential hypertension   . Other and unspecified hyperlipidemia   . Gastritis   . Abdominal pain, left lower quadrant   . Diverticulosis of colon (without mention of hemorrhage)   . Irritable bowel syndrome   . Colonic polyp 04-27-2009    tubular adenoma  . Headache(784.0)   . Dizziness   . Breast cancer, Left 12/20/2010  . Gallstones   . CAD (coronary artery disease)     a. 01/19/2010 s/p CABG x 3, lima->lad, vg->diag, vg->om1;  b. 06/2011 :Lexi MV: EF 84%, No ischemia.  . Bronchitis     hx of  . Pneumonia   . Hiatal hernia   . Fibromyalgia   . Esophageal reflux     hiatal hernia  . Atrophic vaginitis   . GERD (gastroesophageal reflux disease)   . Hypercholesterolemia   . Breast cancer   . Glaucoma   . Cervical arthritis     Past Surgical History  Procedure Laterality Date  . Coronary angioplasty with stent placement      Stent 2007  . Open heart surgery       01/19/2010  . Coronary artery bypass graft    . Eye surgery      bilateral cataract removal,  . Breast lumpectomy  02/06/11    left  . Cardiac stints    . Glaucoma repair      Current Outpatient Prescriptions  Medication Sig Dispense Refill  . aspirin 81 MG tablet Take 81 mg by mouth daily.        . Coenzyme Q10 (COQ10) 100 MG CAPS Take 1 capsule by mouth daily.        . cyclobenzaprine (FLEXERIL) 5 MG tablet Take 1/2 to 1 tablet by mouth two times daily as needed  60 tablet  5  . fexofenadine (ALLEGRA) 180 MG tablet Take 180 mg by mouth daily.        . fluocinonide (LIDEX) 0.05 % external solution       . fluticasone (CUTIVATE) 0.62 % cream 1 application 2 (two) times daily.       . Glucosamine-Chondroit-Vit C-Mn (GLUCOSAMINE CHONDR 1500 COMPLX PO) Take 1 tablet by mouth 3 (three) times daily.        . hyoscyamine (ANASPAZ) 0.125 MG TBDP tablet PLACE 1 TABLET UNDER THE TONGUE EVERY 6 HOURS AS NEEDED.  40 tablet  2  . isosorbide mononitrate (IMDUR) 30  MG 24 hr tablet Take 1 tablet (30 mg total) by mouth daily.  90 tablet  3  . ketoconazole (NIZORAL) 2 % shampoo       . latanoprost (XALATAN) 0.005 % ophthalmic solution Place 1 drop into both eyes at bedtime.       [name] letrozole (FEMARA) 2.5 MG tablet Take 1 tablet (2.5 mg total) by mouth daily.  90 tablet  3  . losartan-hydrochlorothiazide (HYZAAR) 100-12.5 MG per tablet Take 1 tablet by mouth daily.  30 tablet  6  . metoprolol (LOPRESSOR) 50 MG tablet Take 1 tablet (50 mg total) by mouth 2 (two) times daily.  60 tablet  5  . mometasone (NASONEX) 50 MCG/ACT nasal spray 2 sprays by Nasal route daily.        . nitroGLYCERIN (NITROSTAT) 0.4 MG SL tablet Take one tablet  By mouth SL every 5 minutes for a total of 3 doses as needed for chest pain.  25 tablet  4  . Omega-3 Fatty Acids (FISH OIL) 1200 MG CAPS 2 tabs po bid       . omeprazole (PRILOSEC) 40 MG capsule take 1 capsule by mouth once daily 30 MINUTES BEFORE THE FIRST MEAL OF THE DAY  30  capsule  1  . vitamin B-12 (CYANOCOBALAMIN) 1000 MCG tablet Take 1,000 mcg by mouth daily.         No current facility-administered medications for this visit.    Family History  Problem Relation Age of Onset  . Heart disease Brother   . Cancer Brother     gland cancer  . Diabetes Sister   . Breast cancer Sister   . Stroke Mother   . Stroke Father     ROS:  Pertinent items are noted in HPI.  Otherwise, a comprehensive ROS was negative.  Exam:   BP 96/62  Pulse 68  Ht 4' 10.4" (1.483 m)  Wt 136 lb (61.689 kg)  BMI 28.05 kg/m2  LMP 11/26/1990 Height: 4' 10.4" (148.3 cm)  Ht Readings from Last 3 Encounters:  08/31/13 4' 10.4" (1.483 m)  08/11/13 5' (1.524 m)  07/27/13 4\' 10"  (1.473 m)    General appearance: alert, cooperative and appears stated age Head: Normocephalic, without obvious abnormality, atraumatic Neck: no adenopathy, supple, symmetrical, trachea midline and thyroid normal to inspection and palpation Lungs: clear to auscultation bilaterally Breasts: normal appearance, no masses or tenderness Heart: regular rate and rhythm Abdomen: soft, non-tender; no masses,  no organomegaly Extremities: extremities normal, atraumatic, no cyanosis or edema Skin: Skin color, texture, turgor normal. No rashes or lesions Lymph nodes: Cervical, supraclavicular, and axillary nodes normal. No abnormal inguinal nodes palpated Neurologic: Grossly normal   Pelvic: External genitalia:  no lesions              Urethra:  normal appearing urethra with no masses, tenderness or lesions              Bartholin's and Skene's: normal                 Vagina: normal appearing vagina with normal color and discharge, no lesions              Cervix: anteverted              Pap taken: No. Bimanual Exam:  Uterus:  normal size, contour, position, consistency, mobility, non-tender              Adnexa: no mass, fullness, tenderness  Rectovaginal: Confirms               Anus:  normal  sphincter tone, no lesions  A:  Well Woman with normal exam  S/P left invasive breast cancer 2012 treated with lumpectomy and radiation, on Femara  History of HTN, hypercholesterolemia, CAD with CABG 12/2009  Osteopenia   P:   Reviewed health and wellness pertinent to exam  Pap smear not taken today  Mammogram is due 10/15  Counseled on breast self exam, mammography screening, adequate intake of calcium and vitamin D, diet and exercise, Kegel's exercises return annually or prn  An After Visit Summary was printed and given to the patient.

## 2013-09-01 ENCOUNTER — Telehealth: Payer: Self-pay | Admitting: Pulmonary Disease

## 2013-09-01 NOTE — Progress Notes (Signed)
Encounter reviewed by Dr. Brook Silva.  

## 2013-09-01 NOTE — Telephone Encounter (Signed)
Per Leigh we will not do PA for this as patient can have her Rx switched to a [facility] of her choice and pay $10 out of pocket. I spoke with patient who verbalized her understanding of this and will call [facility] to have them transfer the Rx to a [facility] closest to her. Nothing more needed at this time.

## 2013-09-02 DIAGNOSIS — H01119 Allergic dermatitis of unspecified eye, unspecified eyelid: Secondary | ICD-10-CM | POA: Diagnosis not present

## 2013-09-02 DIAGNOSIS — H01029 Squamous blepharitis unspecified eye, unspecified eyelid: Secondary | ICD-10-CM | POA: Diagnosis not present

## 2013-09-07 DIAGNOSIS — M9981 Other biomechanical lesions of cervical region: Secondary | ICD-10-CM | POA: Diagnosis not present

## 2013-09-07 DIAGNOSIS — M5030 Other cervical disc degeneration, unspecified cervical region: Secondary | ICD-10-CM | POA: Diagnosis not present

## 2013-09-09 DIAGNOSIS — H01119 Allergic dermatitis of unspecified eye, unspecified eyelid: Secondary | ICD-10-CM | POA: Diagnosis not present

## 2013-09-10 DIAGNOSIS — J309 Allergic rhinitis, unspecified: Secondary | ICD-10-CM | POA: Diagnosis not present

## 2013-09-24 ENCOUNTER — Other Ambulatory Visit: Payer: Self-pay

## 2013-09-24 MED ORDER — METOPROLOL TARTRATE 50 MG PO TABS: 50.0000 mg | ORAL_TABLET | Freq: Two times a day (BID) | ORAL | Status: DC

## 2013-09-27 ENCOUNTER — Other Ambulatory Visit: Payer: Self-pay

## 2013-09-27 MED ORDER — METOPROLOL TARTRATE 50 MG PO TABS
50.0000 mg | ORAL_TABLET | Freq: Two times a day (BID) | ORAL | Status: DC
Start: 2013-09-27 — End: 2013-12-30

## 2013-09-27 MED ORDER — METOPROLOL TARTRATE 50 MG PO TABS: 50.0000 mg | ORAL_TABLET | Freq: Two times a day (BID) | ORAL | Status: DC

## 2013-09-29 DIAGNOSIS — M9981 Other biomechanical lesions of cervical region: Secondary | ICD-10-CM | POA: Diagnosis not present

## 2013-09-29 DIAGNOSIS — M5030 Other cervical disc degeneration, unspecified cervical region: Secondary | ICD-10-CM | POA: Diagnosis not present

## 2013-10-01 DIAGNOSIS — J309 Allergic rhinitis, unspecified: Secondary | ICD-10-CM | POA: Diagnosis not present

## 2013-10-12 DIAGNOSIS — M5030 Other cervical disc degeneration, unspecified cervical region: Secondary | ICD-10-CM | POA: Diagnosis not present

## 2013-10-12 DIAGNOSIS — M9981 Other biomechanical lesions of cervical region: Secondary | ICD-10-CM | POA: Diagnosis not present

## 2013-10-13 DIAGNOSIS — J309 Allergic rhinitis, unspecified: Secondary | ICD-10-CM | POA: Diagnosis not present

## 2013-10-26 DIAGNOSIS — M9981 Other biomechanical lesions of cervical region: Secondary | ICD-10-CM | POA: Diagnosis not present

## 2013-10-26 DIAGNOSIS — M5030 Other cervical disc degeneration, unspecified cervical region: Secondary | ICD-10-CM | POA: Diagnosis not present

## 2013-10-29 ENCOUNTER — Encounter: Payer: Self-pay | Admitting: *Deleted

## 2013-11-03 ENCOUNTER — Telehealth: Payer: Self-pay | Admitting: *Deleted

## 2013-11-03 NOTE — Telephone Encounter (Signed)
Left patient a message that appt scheduled 11-16-13 was canceled and needs to be rescheduled.

## 2013-11-04 DIAGNOSIS — J309 Allergic rhinitis, unspecified: Secondary | ICD-10-CM | POA: Diagnosis not present

## 2013-11-09 DIAGNOSIS — M9981 Other biomechanical lesions of cervical region: Secondary | ICD-10-CM | POA: Diagnosis not present

## 2013-11-09 DIAGNOSIS — M5030 Other cervical disc degeneration, unspecified cervical region: Secondary | ICD-10-CM | POA: Diagnosis not present

## 2013-11-10 ENCOUNTER — Encounter (INDEPENDENT_AMBULATORY_CARE_PROVIDER_SITE_OTHER): Payer: Self-pay

## 2013-11-10 ENCOUNTER — Ambulatory Visit (INDEPENDENT_AMBULATORY_CARE_PROVIDER_SITE_OTHER): Payer: Medicare Other | Admitting: Nurse Practitioner

## 2013-11-10 ENCOUNTER — Encounter: Payer: Self-pay | Admitting: Nurse Practitioner

## 2013-11-10 ENCOUNTER — Telehealth: Payer: Self-pay | Admitting: Gastroenterology

## 2013-11-10 VITALS — BP 158/78 | HR 67 | Ht <= 58 in | Wt 135.0 lb

## 2013-11-10 DIAGNOSIS — I2510 Atherosclerotic heart disease of native coronary artery without angina pectoris: Secondary | ICD-10-CM | POA: Diagnosis not present

## 2013-11-10 DIAGNOSIS — M542 Cervicalgia: Secondary | ICD-10-CM | POA: Diagnosis not present

## 2013-11-10 DIAGNOSIS — R51 Headache: Secondary | ICD-10-CM | POA: Diagnosis not present

## 2013-11-10 MED ORDER — METRONIDAZOLE 250 MG PO TABS: ORAL_TABLET | ORAL | Status: DC

## 2013-11-10 MED ORDER — CIPROFLOXACIN HCL 500 MG PO TABS: ORAL_TABLET | ORAL | Status: DC

## 2013-11-10 MED ORDER — TIZANIDINE HCL 2 MG PO CAPS: 2.0000 mg | ORAL_CAPSULE | Freq: Two times a day (BID) | ORAL | Status: DC

## 2013-11-10 NOTE — Progress Notes (Signed)
[facility]  PATIENT: [name] DOB: [birth year]   REASON FOR VISIT: follow up for headache, neck pain    HISTORY OF PRESENT ILLNESS:[name], 78 year old female returns for followup. She was last seen in this office 01/21/20135. She was initially evaluated by Dr. Krista Blue 11/06/2012 for frequent headaches. CAT scan of the brain was normal, MRI of the cervical spine shows multilevel degenerative disc disease. She was placed on Pamelor by Dr. Krista Blue and read the side effects of the medication and did not take it. Her C-reactive protein returned normal. In the meantime she has seen Dr. Lenna Gilford who ordered Flexeril, she is only taking it when necessary but it is beneficial. Today's visit however she says  it makes her so drowsy that she only takes it every couple of days. Her headaches are reduced on the medication. She is doing her neck exercises intermittently. She returns for reevaluation.    HISTORY: evaluation of headaches  She had a past medical history of coronary artery disease, status post a stent, hypertension, hyperlipidemia, she is taking beta blocker metoprolol 50 mg twice a day. she also has a history of breast cancer, status post left lobectomy followed by chemotherapy and radiation therapy in 2012  She reported a history of migraine headaches since age 20s, her typical migraine are occipital area bandlike, severe pounding headache with associated light noise sensitivity, clustered around her menstruation period of time, her headache has quieted down for many years, until about 6 months ago, she began to experience frequent headaches again, somewhat similar to the headache she had previously, bandlike starting from occipital,to bilateral temporal frontal region, pressure, with associated light noise sensitivity.  She reported 15 days out of a month, she would have moderate degree of headaches  She has tried ice and heating pad, which helped her headache some, she  is also taking Percocet, Zofran as needed for nausea.  She had CAT scan of the brain that was normal, MRI of cervical spine showed multilevel degenerative disc disease, mild right foraminal stenosis at C3-4, secondary to asymmetric right-sided facet hypertrophy, and joint disease,  She denies jaw claudication, no gait difficulty no diffuse muscle achy pain, no chewing or swallowing difficulties   REVIEW OF SYSTEMS: Full 14 system review of systems performed and notable only for those listed, all others are neg:  Constitutional: N/A  Cardiovascular: N/A  Ear/Nose/Throat: N/A  Skin: N/A  Eyes: N/A  Respiratory: Cough Gastroitestinal: N/A  Hematology/Lymphatic: N/A  Endocrine: N/A Musculoskeletal: Joint pain, neck pain  Allergy/Immunology: N/A  Neurological: Occasional headache Psychiatric: N/A Sleep : Restless legs  ALLERGIES: Allergies  Allergen Reactions  . Choline Fenofibrate Other (See Comments)    REACTION: pt states INTOL to Trilipix w/ "thigh burning"  . Simvastatin Other (See Comments)    REACTION: pt states INTOL to STATINS \\T \ refuses to restart  . Hydrocodone     Nightmare after taking cough syrup w/hydrocodone  . Adhesive [Tape] Rash  . Ceclor [Cefaclor] Rash  . Clarithromycin Rash  . Codeine Nausea Only  . Doxycycline Rash  . Lisinopril Cough    Developed ACE cough...  . Penicillins Itching and Rash    At injection site  . Tobramycin-Dexamethasone Rash    HOME MEDICATIONS: Outpatient Prescriptions Prior to Visit  Medication Sig Dispense Refill  . aspirin 81 MG tablet Take 81 mg by mouth daily.        . fexofenadine (ALLEGRA) 180 MG tablet Take 180 mg by mouth daily.        [name]  fluocinonide (LIDEX) 0.05 % external solution       . Glucosamine-Chondroit-Vit C-Mn (GLUCOSAMINE CHONDR 1500 COMPLX PO) Take 1 tablet by mouth 3 (three) times daily.        . hyoscyamine (ANASPAZ) 0.125 MG TBDP tablet PLACE 1 TABLET UNDER THE TONGUE EVERY 6 HOURS AS NEEDED.  40 tablet   2  . isosorbide mononitrate (IMDUR) 30 MG 24 hr tablet Take 1 tablet (30 mg total) by mouth daily.  90 tablet  3  . ketoconazole (NIZORAL) 2 % shampoo       . latanoprost (XALATAN) 0.005 % ophthalmic solution Place 1 drop into both eyes at bedtime.       [name] letrozole (FEMARA) 2.5 MG tablet Take 1 tablet (2.5 mg total) by mouth daily.  90 tablet  3  . losartan-hydrochlorothiazide (HYZAAR) 100-12.5 MG per tablet Take 1 tablet by mouth daily.  30 tablet  6  . metoprolol (LOPRESSOR) 50 MG tablet Take 1 tablet (50 mg total) by mouth 2 (two) times daily.  60 tablet  1  . metroNIDAZOLE (FLAGYL) 250 MG tablet Take one po TID x 7 days  21 tablet  0  . mometasone (NASONEX) 50 MCG/ACT nasal spray 2 sprays by Nasal route daily.        . nitroGLYCERIN (NITROSTAT) 0.4 MG SL tablet Take one tablet  By mouth SL every 5 minutes for a total of 3 doses as needed for chest pain.  25 tablet  4  . Omega-3 Fatty Acids (FISH OIL) 1200 MG CAPS 2 tabs po bid       . omeprazole (PRILOSEC) 40 MG capsule take 1 capsule by mouth once daily 30 MINUTES BEFORE THE FIRST MEAL OF THE DAY  30 capsule  1  . vitamin B-12 (CYANOCOBALAMIN) 1000 MCG tablet Take 1,000 mcg by mouth daily.        . cyclobenzaprine (FLEXERIL) 5 MG tablet Take 1/2 to 1 tablet by mouth two times daily as needed  60 tablet  5  . ciprofloxacin (CIPRO) 500 MG tablet Take one po BID x 7 days  14 tablet  0  . Coenzyme Q10 (COQ10) 100 MG CAPS Take 1 capsule by mouth daily.        . fluticasone (CUTIVATE) 1.76 % cream 1 application 2 (two) times daily.        No facility-administered medications prior to visit.    PAST MEDICAL HISTORY: Past Medical History  Diagnosis Date  . Unspecified glaucoma   . Allergic rhinitis, cause unspecified   . Unspecified asthma(493.90)   . Unspecified essential hypertension   . Other and unspecified hyperlipidemia   . Gastritis   . Abdominal pain, left lower quadrant   . Diverticulosis of colon (without mention of hemorrhage)    . Irritable bowel syndrome   . Colonic polyp 04-27-2009    tubular adenoma  . Headache(784.0)   . Dizziness   . Breast cancer, Left 12/20/2010  . Gallstones   . CAD (coronary artery disease)     a. 01/19/2010 s/p CABG x 3, lima->lad, vg->diag, vg->om1;  b. 06/2011 :Lexi MV: EF 84%, No ischemia.  . Bronchitis     hx of  . Pneumonia   . Hiatal hernia   . Fibromyalgia   . Esophageal reflux     hiatal hernia  . Atrophic vaginitis   . GERD (gastroesophageal reflux disease)   . Hypercholesterolemia   . Breast cancer   . Glaucoma   . Cervical arthritis  PAST SURGICAL HISTORY: Past Surgical History  Procedure Laterality Date  . Coronary angioplasty with stent placement      Stent 2007  . Open heart surgery      01/19/2010  . Coronary artery bypass graft    . Eye surgery      bilateral cataract removal,  . Breast lumpectomy  02/06/11    left  . Cardiac stints    . Glaucoma repair      FAMILY HISTORY: Family History  Problem Relation Age of Onset  . Heart disease Brother   . Cancer Brother     gland cancer  . Diabetes Sister   . Breast cancer Sister   . Stroke Mother   . Stroke Father     SOCIAL HISTORY: History   Social History  . Marital Status: Married    Spouse Name: Gildardo Griffes. Skipper    Number of Children: 2  . Years of Education: 12   Occupational History  . part time preschool teacher-retired    Social History Main Topics  . Smoking status: Never Smoker   . Smokeless tobacco: Never Used  . Alcohol Use: No  . Drug Use: No  . Sexual Activity: Yes    Birth Control/ Protection: Post-menopausal   Other Topics Concern  . Not on file   Social History Narrative   Patient lives at home with her husband Marcello Moores). Patient is retired. Patient  Has 12 th grade education.    Caffeine- sometimes- One cup of coffee.   Right handed.   Four granddaughters.     PHYSICAL EXAM  Filed Vitals:   11/10/13 1328  BP: 158/78  Pulse: 67  Height: 4\' 9"   (1.448 m)  Weight: 135 lb (61.236 kg)   Body mass index is 29.21 kg/(m^2). Generalized: Well developed, in no acute distress  Head: normocephalic and atraumatic,. Oropharynx benign  Neck: Supple, no carotid bruits  Cardiac: Regular rate rhythm  Musculoskeletal: No deformity  Neurological examination  Mentation: Alert oriented to time, place, history taking. Follows all commands speech and language fluent  Cranial nerve II-XII: Pupils were equal round reactive to light extraocular movements were full, visual field were full on confrontational test. Facial sensation and strength were normal. hearing was intact to finger rubbing bilaterally. Uvula tongue midline. head turning and shoulder shrug were normal and symmetric.Tongue protrusion into cheek strength was normal.  Motor: normal bulk and tone, full strength in the BUE, BLE, No focal weakness  Sensory: normal and symmetric to light touch, pinprick, and vibration  Coordination: finger-nose-finger, heel-to-shin bilaterally, no dysmetria  Reflexes: Brachioradialis 2/2, biceps 2/2, triceps 2/2, patellar 2/2, Achilles 2/2, plantar responses were flexor bilaterally.  Gait and Station: Rising up from seated position without assistance, normal stance, moderate stride, good arm swing, smooth turning, able to perform tiptoe, and heel walking without difficulty. Tandem gait is steady. No assistive device.  DIAGNOSTIC DATA (LABS, IMAGING, TESTING) - I reviewed patient records, labs, notes, testing and imaging myself where available.  Lab Results  Component Value Date   WBC 7.2 07/27/2013   HGB 12.8 07/27/2013   HCT 37.7 07/27/2013   MCV 87.9 07/27/2013   PLT 253 07/27/2013      Component Value Date/Time   NA 132* 07/27/2013 1008   NA 130* 06/17/2012 0550   K 4.6 07/27/2013 1008   K 4.6 06/17/2012 0550   CL 95* 07/28/2012 0941   CL 92* 06/17/2012 0550   CO2 23 07/27/2013 1008   CO2 24 06/17/2012  0550   GLUCOSE 88 07/27/2013 1008   GLUCOSE 108* 07/28/2012 0941    GLUCOSE 135* 06/17/2012 0550   BUN 12.7 07/27/2013 1008   BUN 12 06/17/2012 0550   CREATININE 0.7 07/27/2013 1008   CREATININE 0.48* 06/17/2012 0550   CALCIUM 9.5 07/27/2013 1008   CALCIUM 10.1 06/17/2012 0550   PROT 7.1 07/27/2013 1008   PROT 7.4 06/16/2012 1530   ALBUMIN 4.0 07/27/2013 1008   ALBUMIN 3.9 06/16/2012 1530   AST 22 07/27/2013 1008   AST 22 06/16/2012 1530   ALT 23 07/27/2013 1008   ALT 19 06/16/2012 1530   ALKPHOS 82 07/27/2013 1008   ALKPHOS 78 06/16/2012 1530   BILITOT 1.25* 07/27/2013 1008   BILITOT 0.7 06/16/2012 1530   GFRNONAA >90 06/17/2012 0550   GFRAA >90 06/17/2012 0550     ASSESSMENT AND PLAN  78 y.o. year old female  has a past medical history of  Unspecified essential hypertension; Other and unspecified hyperlipidemia; Irritable bowel syndrome;  Headache(784.0); Dizziness; Breast cancer, Left (12/20/2010); CAD (coronary artery disease); Bronchitis;  Fibromyalgia; here to follow up  Change Flexeril to tizanidine 2 mg twice daily due to drowsiness, RX to patient Continue neck exercises at least daily Followup in 6-8 months, next visit with Dr. Luan Pulling, [facility], [facility], [facility] [address] [place], [place] [address] [phone]

## 2013-11-10 NOTE — Patient Instructions (Signed)
Change Flexeril to tizanidine 2 mg twice daily due to drowsiness Continue neck exercises at least daily Followup in 6-8 months, next visit with Dr. Krista Blue

## 2013-11-10 NOTE — Telephone Encounter (Signed)
Spoke with patient and she woke up at 4 AM with lower abdominal pain. She feels like she has to have a bowel movement but cannot empty. This is similar to how she felt back in 2013 when she had diverticulitis. She will go to clear liquids x 24 then bland diet.Hx Sharlett Iles, no preference for GI MD. No appointments available this week with APP. Spoke with Dr. Deatra Ina. Cipro 500 mg BID and Flagyl 250 mg TID x 7 days ordered. Patient to call with update on Friday. Scheduled OV with Nicoletta Ba, PA 11/15/13 3:00 PM. Patient notified.

## 2013-11-12 ENCOUNTER — Telehealth: Payer: Self-pay | Admitting: Physician Assistant

## 2013-11-12 NOTE — Telephone Encounter (Signed)
Spoke with patient and she is feeling better. She will keep appointment as scheduled on Monday.

## 2013-11-15 ENCOUNTER — Ambulatory Visit (INDEPENDENT_AMBULATORY_CARE_PROVIDER_SITE_OTHER): Payer: Medicare Other | Admitting: Physician Assistant

## 2013-11-15 ENCOUNTER — Encounter: Payer: Self-pay | Admitting: Physician Assistant

## 2013-11-15 VITALS — BP 150/70 | HR 68 | Ht <= 58 in | Wt 137.6 lb

## 2013-11-15 DIAGNOSIS — K5730 Diverticulosis of large intestine without perforation or abscess without bleeding: Secondary | ICD-10-CM

## 2013-11-15 DIAGNOSIS — I2510 Atherosclerotic heart disease of native coronary artery without angina pectoris: Secondary | ICD-10-CM | POA: Diagnosis not present

## 2013-11-15 DIAGNOSIS — K5732 Diverticulitis of large intestine without perforation or abscess without bleeding: Secondary | ICD-10-CM

## 2013-11-15 DIAGNOSIS — Z860100 Personal history of colon polyps, unspecified: Secondary | ICD-10-CM

## 2013-11-15 MED ORDER — METRONIDAZOLE 250 MG PO TABS: ORAL_TABLET | ORAL | Status: DC

## 2013-11-15 NOTE — Progress Notes (Signed)
Subjective:    Patient ID: [name], female    DOB: Nov 30, 1935, 78 y.o.   MRN: [id number]  HPI [name]  is a very nice 78 year old female known to Dr. Sharlett Iles who has history of adenomatous colon polyps, diverticulosis, GERD, and IBS. She last had colonoscopy in March of 2011 was found to have a diminutive cecal polyp which was removed and passed consistent with tubular adenoma, she also had moderate sigmoid and left colon diverticulosis. Patient has had previous episodes of diverticulitis and called our office last week with an acute episode of lower abdominal pain and pressure consistent with her diverticulitis. She was called in a prescription of Cipro and Flagyl x7 days and comes in today for followup. Apparently there was some confusion about the antibiotics and she has only been taking Flagyl 253 times daily but nevertheless feels much better. She says she's at least 75% better. She's not having any fever or chills her appetite has been good and she's not having any significant abdominal discomfort at this time with by mouth intake. She did put herself on a liquid diet for the first 24 hours. Bowel movements are normal she got noted any melena or hematochezia. She has been needing bland and smaller amounts. She still has some very dull discomfort in the left lower quadrant.    Review of Systems  Constitutional: Negative.   HENT: Negative.   Eyes: Negative.   Respiratory: Negative.   Cardiovascular: Negative.   Gastrointestinal: Positive for abdominal pain.  Endocrine: Negative.   Genitourinary: Negative.   Musculoskeletal: Negative.   Allergic/Immunologic: Negative.   Neurological: Negative.   Hematological: Negative.   Psychiatric/Behavioral: Negative.    Outpatient Prescriptions Prior to Visit  Medication Sig Dispense Refill  . aspirin 81 MG tablet Take 81 mg by mouth daily.        . fexofenadine (ALLEGRA) 180 MG tablet Take 180 mg by mouth daily.        .  fluocinonide (LIDEX) 0.05 % external solution       . Glucosamine-Chondroit-Vit C-Mn (GLUCOSAMINE CHONDR 1500 COMPLX PO) Take 1 tablet by mouth 3 (three) times daily.        . hyoscyamine (ANASPAZ) 0.125 MG TBDP tablet PLACE 1 TABLET UNDER THE TONGUE EVERY 6 HOURS AS NEEDED.  40 tablet  2  . isosorbide mononitrate (IMDUR) 30 MG 24 hr tablet Take 1 tablet (30 mg total) by mouth daily.  90 tablet  3  . ketoconazole (NIZORAL) 2 % shampoo       . latanoprost (XALATAN) 0.005 % ophthalmic solution Place 1 drop into both eyes at bedtime.       [name] letrozole (FEMARA) 2.5 MG tablet Take 1 tablet (2.5 mg total) by mouth daily.  90 tablet  3  . losartan-hydrochlorothiazide (HYZAAR) 100-12.5 MG per tablet Take 1 tablet by mouth daily.  30 tablet  6  . metoprolol (LOPRESSOR) 50 MG tablet Take 1 tablet (50 mg total) by mouth 2 (two) times daily.  60 tablet  1  . mometasone (NASONEX) 50 MCG/ACT nasal spray 2 sprays by Nasal route daily.        . nitroGLYCERIN (NITROSTAT) 0.4 MG SL tablet Take one tablet  By mouth SL every 5 minutes for a total of 3 doses as needed for chest pain.  25 tablet  4  . Omega-3 Fatty Acids (FISH OIL) 1200 MG CAPS 2 tabs po bid       . omeprazole (PRILOSEC) 40 MG capsule take  1 capsule by mouth once daily 30 MINUTES BEFORE THE FIRST MEAL OF THE DAY  30 capsule  1  . tizanidine (ZANAFLEX) 2 MG capsule Take 1 capsule (2 mg total) by mouth 2 (two) times daily.  60 capsule  6  . vitamin B-12 (CYANOCOBALAMIN) 1000 MCG tablet Take 1,000 mcg by mouth daily.        . metroNIDAZOLE (FLAGYL) 250 MG tablet Take one po TID x 7 days  21 tablet  0   No facility-administered medications prior to visit.   Allergies  Allergen Reactions  . Choline Fenofibrate Other (See Comments)    REACTION: pt states INTOL to Trilipix w/ "thigh burning"  . Simvastatin Other (See Comments)    REACTION: pt states INTOL to STATINS \\T \ refuses to restart  . Hydrocodone     Nightmare after taking cough syrup  w/hydrocodone  . Adhesive [Tape] Rash  . Ceclor [Cefaclor] Rash  . Clarithromycin Rash  . Codeine Nausea Only  . Doxycycline Rash  . Lisinopril Cough    Developed ACE cough...  . Penicillins Itching and Rash    At injection site  . Tobramycin-Dexamethasone Rash   Patient Active Problem List   Diagnosis Date Noted  . Diverticulosis of colon without hemorrhage 11/15/2013  . Hot flashes related to aromatase inhibitor therapy 07/27/2013  . Osteopenia 07/27/2013  . DJD (degenerative joint disease) 09/29/2012  . Neck pain 07/03/2012  . Chronic asthmatic bronchitis with acute exacerbation 06/16/2012  . Breast cancer, Left 12/20/2010  . Leg pain 11/28/2010  . FATTY LIVER DISEASE 01/09/2010  . CHOLELITHIASIS 01/09/2010  . CHEST WALL PAIN, ACUTE 12/19/2009  . GASTRITIS 12/16/2007  . COLONIC POLYPS, HX OF 12/16/2007  . ALLERGIC RHINITIS 02/18/2007  . DIZZINESS, CHRONIC 02/18/2007  . Headache(784.0) 02/18/2007  . HYPERLIPIDEMIA 12/23/2006  . GLAUCOMA 12/23/2006  . HYPERTENSION 12/23/2006  . CORONARY ARTERY DISEASE 12/23/2006  . ASTHMA 12/23/2006  . GERD 12/23/2006  . DIVERTICULOSIS, COLON 12/23/2006  . IRRITABLE BOWEL SYNDROME 12/23/2006  . FIBROMYALGIA 12/23/2006   History  Substance Use Topics  . Smoking status: Never Smoker   . Smokeless tobacco: Never Used  . Alcohol Use: No   family history includes Breast cancer in her sister; Cancer in her brother; Diabetes in her sister; Heart disease in her brother; Prostate cancer in her father; Stroke in her father and mother. There is no history of Colon cancer, Stomach cancer, Pancreatic cancer, Kidney disease, or Liver disease.     Objective:   Physical Exam  well-developed elderly white female in no acute distress, pleasant blood pressure 150/70 pulse 68 height 4 foot 9 weight 137. HEENT; nontraumatic normocephalic EOMI PERRLA sclera anicteric, Supple ;no JVD, Cardiovascular; regular rate and rhythm with S1-S2 no murmur or gallop,  Pulmonary; clear bilaterally, Abdomen ;soft she has very minimal tenderness in left lower quadrant there is no guarding or rebound no palpable mass or hepatosplenomegaly bowel sounds are present, Rectal ;exam not done, Extremities ;no clubbing cyanosis or edema skin warm dry, Psych; mood and affect appropriate        Assessment & Plan:  #53 78 year old female with recurrent sigmoid diverticulitis, improving on metronidazole alone and has been to take any other antibiotics as she has been generally intolerant. #2 history of adenomatous colon polyps last colonoscopy March 2011 due for followup March 2016 #3 chronic GERD typing continue omeprazole 40 mg by mouth every morning  Plan; Will leave patient on metronidazole 250 mg 3 times daily but extend her course out  for a total of 10 days. She is asked to call if her symptoms have not completely resolved when she finishes the antibiotics She asked about treatment for yeast infection if necessary and is advised to use over-the-counter Monistat if needed Will plan to followup in March of 2016 to schedule colonoscopy. She will be established with Dr. Fuller Plan

## 2013-11-15 NOTE — Progress Notes (Signed)
Reviewed and agree with management plan.  Jahliyah Trice T. Crislyn Willbanks, MD FACG 

## 2013-11-15 NOTE — Patient Instructions (Signed)
We have sent the following medications to your pharmacy for you to pick up at your convenience: Flagyl 250 mg three times daily x 3 additional days= 10 days total  Please follow up with Nicoletta Ba, PA-C or Dr Fuller Plan in March 2016 to discuss a colonoscopy.

## 2013-11-16 ENCOUNTER — Ambulatory Visit: Payer: Medicare Other | Admitting: Nurse Practitioner

## 2013-11-17 DIAGNOSIS — J309 Allergic rhinitis, unspecified: Secondary | ICD-10-CM | POA: Diagnosis not present

## 2013-11-29 ENCOUNTER — Other Ambulatory Visit (INDEPENDENT_AMBULATORY_CARE_PROVIDER_SITE_OTHER): Payer: Medicare Other

## 2013-11-29 ENCOUNTER — Telehealth: Payer: Self-pay | Admitting: Pulmonary Disease

## 2013-11-29 DIAGNOSIS — R300 Dysuria: Secondary | ICD-10-CM

## 2013-11-29 LAB — URINALYSIS, ROUTINE W REFLEX MICROSCOPIC
Specific Gravity, Urine: <=1.005 — AB (ref 1.000–1.030)
pH: 6.5 (ref 5.0–8.0)

## 2013-11-29 MED ORDER — CIPROFLOXACIN HCL 250 MG PO TABS: 250.0000 mg | ORAL_TABLET | Freq: Two times a day (BID) | ORAL | Status: DC

## 2013-11-29 NOTE — Telephone Encounter (Signed)
Called and spoke with pt and she is aware to come and drop off the urine specimen.    Pt stated that she has been taking the AZO over the weekend and this has stopped some of the pain but she is still having the pressure.  Wanted to see if SN recs anything else for her.  SN please advise. Thanks  Allergies  Allergen Reactions  . Choline Fenofibrate Other (See Comments)    REACTION: pt states INTOL to Trilipix w/ "thigh burning"  . Simvastatin Other (See Comments)    REACTION: pt states INTOL to STATINS \\T \ refuses to restart  . Hydrocodone     Nightmare after taking cough syrup w/hydrocodone  . Adhesive [Tape] Rash  . Ceclor [Cefaclor] Rash  . Clarithromycin Rash  . Codeine Nausea Only  . Doxycycline Rash  . Lisinopril Cough    Developed ACE cough...  . Penicillins Itching and Rash    At injection site  . Tobramycin-Dexamethasone Rash    Current Outpatient Prescriptions on File Prior to Visit  Medication Sig Dispense Refill  . aspirin 81 MG tablet Take 81 mg by mouth daily.        . fexofenadine (ALLEGRA) 180 MG tablet Take 180 mg by mouth daily.        . fluocinonide (LIDEX) 0.05 % external solution       . Glucosamine-Chondroit-Vit C-Mn (GLUCOSAMINE CHONDR 1500 COMPLX PO) Take 1 tablet by mouth 3 (three) times daily.        . hyoscyamine (ANASPAZ) 0.125 MG TBDP tablet PLACE 1 TABLET UNDER THE TONGUE EVERY 6 HOURS AS NEEDED.  40 tablet  2  . isosorbide mononitrate (IMDUR) 30 MG 24 hr tablet Take 1 tablet (30 mg total) by mouth daily.  90 tablet  3  . ketoconazole (NIZORAL) 2 % shampoo       . latanoprost (XALATAN) 0.005 % ophthalmic solution Place 1 drop into both eyes at bedtime.       [name] letrozole (FEMARA) 2.5 MG tablet Take 1 tablet (2.5 mg total) by mouth daily.  90 tablet  3  . losartan-hydrochlorothiazide (HYZAAR) 100-12.5 MG per tablet Take 1 tablet by mouth daily.  30 tablet  6  . metoprolol (LOPRESSOR) 50 MG tablet Take 1 tablet (50 mg total) by mouth 2 (two) times daily.   60 tablet  1  . metroNIDAZOLE (FLAGYL) 250 MG tablet Take 1 tablet by mouth 3 times daily with meals  9 tablet  0  . mometasone (NASONEX) 50 MCG/ACT nasal spray 2 sprays by Nasal route daily.        . nitroGLYCERIN (NITROSTAT) 0.4 MG SL tablet Take one tablet  By mouth SL every 5 minutes for a total of 3 doses as needed for chest pain.  25 tablet  4  . Omega-3 Fatty Acids (FISH OIL) 1200 MG CAPS 2 tabs po bid       . omeprazole (PRILOSEC) 40 MG capsule take 1 capsule by mouth once daily 30 MINUTES BEFORE THE FIRST MEAL OF THE DAY  30 capsule  1  . tizanidine (ZANAFLEX) 2 MG capsule Take 1 capsule (2 mg total) by mouth 2 (two) times daily.  60 capsule  6  . vitamin B-12 (CYANOCOBALAMIN) 1000 MCG tablet Take 1,000 mcg by mouth daily.         No current facility-administered medications on file prior to visit.

## 2013-11-29 NOTE — Telephone Encounter (Signed)
Per SN: yes, check UA and C&S now.  Call in Cipro 250mg  #14, 1 po BID.  Thank you.  Called spoke with patient who verifies that she has already brought urine specimen to the lab (verified in epic). Advised of SN's recs to begin Cipro today and she voiced her understanding.   Rx sent to verified pharmacy. Pt aware will call once results are available.    Will forward back to SN w/ copy of UA results.

## 2013-11-30 DIAGNOSIS — J3081 Allergic rhinitis due to animal (cat) (dog) hair and dander: Secondary | ICD-10-CM | POA: Diagnosis not present

## 2013-11-30 DIAGNOSIS — S13130A Subluxation of C2/C3 cervical vertebrae, initial encounter: Secondary | ICD-10-CM | POA: Diagnosis not present

## 2013-11-30 DIAGNOSIS — J301 Allergic rhinitis due to pollen: Secondary | ICD-10-CM | POA: Diagnosis not present

## 2013-11-30 DIAGNOSIS — M5032 Other cervical disc degeneration, mid-cervical region: Secondary | ICD-10-CM | POA: Diagnosis not present

## 2013-11-30 DIAGNOSIS — Z23 Encounter for immunization: Secondary | ICD-10-CM | POA: Diagnosis not present

## 2013-11-30 DIAGNOSIS — J3089 Other allergic rhinitis: Secondary | ICD-10-CM | POA: Diagnosis not present

## 2013-11-30 DIAGNOSIS — M9901 Segmental and somatic dysfunction of cervical region: Secondary | ICD-10-CM | POA: Diagnosis not present

## 2013-11-30 DIAGNOSIS — S134XXA Sprain of ligaments of cervical spine, initial encounter: Secondary | ICD-10-CM | POA: Diagnosis not present

## 2013-12-02 LAB — URINE CULTURE

## 2013-12-02 NOTE — Telephone Encounter (Signed)
I spoke with patient about results and she verbalized understanding and had no questions 

## 2013-12-02 NOTE — Telephone Encounter (Signed)
Per SN---culture was +.  Pt was started on cipro and she should finish this course of abx to clear this up.  Called and lmomtcb to make the pt aware.

## 2013-12-02 NOTE — Telephone Encounter (Signed)
Pt returned call [phone]

## 2013-12-02 NOTE — Telephone Encounter (Signed)
ATC line busy x 3 WCB 

## 2013-12-13 ENCOUNTER — Other Ambulatory Visit: Payer: Self-pay | Admitting: *Deleted

## 2013-12-13 MED ORDER — ISOSORBIDE MONONITRATE ER 30 MG PO TB24: 30.0000 mg | ORAL_TABLET | Freq: Every day | ORAL | Status: DC

## 2013-12-14 DIAGNOSIS — M5032 Other cervical disc degeneration, mid-cervical region: Secondary | ICD-10-CM | POA: Diagnosis not present

## 2013-12-14 DIAGNOSIS — S134XXA Sprain of ligaments of cervical spine, initial encounter: Secondary | ICD-10-CM | POA: Diagnosis not present

## 2013-12-14 DIAGNOSIS — M5412 Radiculopathy, cervical region: Secondary | ICD-10-CM | POA: Diagnosis not present

## 2013-12-21 ENCOUNTER — Ambulatory Visit (INDEPENDENT_AMBULATORY_CARE_PROVIDER_SITE_OTHER): Payer: Medicare Other | Admitting: Pulmonary Disease

## 2013-12-21 ENCOUNTER — Ambulatory Visit (INDEPENDENT_AMBULATORY_CARE_PROVIDER_SITE_OTHER)
Admission: RE | Admit: 2013-12-21 | Discharge: 2013-12-21 | Disposition: A | Discharge status: Home / Self Care | Payer: Medicare Other | Source: Ambulatory Visit | Attending: Pulmonary Disease | Admitting: Pulmonary Disease

## 2013-12-21 ENCOUNTER — Encounter: Payer: Self-pay | Admitting: Pulmonary Disease

## 2013-12-21 VITALS — BP 160/78 | HR 71 | Temp 98.0°F | Ht 60.0 in | Wt 138.8 lb

## 2013-12-21 DIAGNOSIS — J449 Chronic obstructive pulmonary disease, unspecified: Secondary | ICD-10-CM

## 2013-12-21 DIAGNOSIS — J209 Acute bronchitis, unspecified: Secondary | ICD-10-CM | POA: Insufficient documentation

## 2013-12-21 DIAGNOSIS — M150 Primary generalized (osteo)arthritis: Secondary | ICD-10-CM

## 2013-12-21 DIAGNOSIS — M159 Polyosteoarthritis, unspecified: Secondary | ICD-10-CM

## 2013-12-21 DIAGNOSIS — I10 Essential (primary) hypertension: Secondary | ICD-10-CM

## 2013-12-21 DIAGNOSIS — J440 Chronic obstructive pulmonary disease with acute lower respiratory infection: Secondary | ICD-10-CM | POA: Insufficient documentation

## 2013-12-21 DIAGNOSIS — K589 Irritable bowel syndrome without diarrhea: Secondary | ICD-10-CM

## 2013-12-21 DIAGNOSIS — K5730 Diverticulosis of large intestine without perforation or abscess without bleeding: Secondary | ICD-10-CM

## 2013-12-21 DIAGNOSIS — J441 Chronic obstructive pulmonary disease with (acute) exacerbation: Secondary | ICD-10-CM

## 2013-12-21 DIAGNOSIS — K219 Gastro-esophageal reflux disease without esophagitis: Secondary | ICD-10-CM

## 2013-12-21 DIAGNOSIS — E782 Mixed hyperlipidemia: Secondary | ICD-10-CM

## 2013-12-21 DIAGNOSIS — C50511 Malignant neoplasm of lower-outer quadrant of right female breast: Secondary | ICD-10-CM

## 2013-12-21 DIAGNOSIS — I2510 Atherosclerotic heart disease of native coronary artery without angina pectoris: Secondary | ICD-10-CM

## 2013-12-21 DIAGNOSIS — R0689 Other abnormalities of breathing: Secondary | ICD-10-CM | POA: Diagnosis not present

## 2013-12-21 DIAGNOSIS — R05 Cough: Secondary | ICD-10-CM | POA: Diagnosis not present

## 2013-12-21 DIAGNOSIS — M609 Myositis, unspecified: Secondary | ICD-10-CM

## 2013-12-21 DIAGNOSIS — IMO0001 Reserved for inherently not codable concepts without codable children: Secondary | ICD-10-CM

## 2013-12-21 DIAGNOSIS — M791 Myalgia: Secondary | ICD-10-CM

## 2013-12-21 MED ORDER — METHYLPREDNISOLONE ACETATE 80 MG/ML IJ SUSP
80.0000 mg | Freq: Once | INTRAMUSCULAR | Status: AC
  Administered 2013-12-21: 80 mg via INTRAMUSCULAR

## 2013-12-21 MED ORDER — LEVOFLOXACIN 500 MG PO TABS: 500.0000 mg | ORAL_TABLET | Freq: Every day | ORAL | Status: DC

## 2013-12-21 MED ORDER — FIRST-DUKES MOUTHWASH MT SUSP: OROMUCOSAL | Status: DC

## 2013-12-21 NOTE — Patient Instructions (Signed)
Today we updated your med list in our EPIC system...    Continue your current medications the same...  For your acute bronchitis>    We gave you a Depo shot for the inflammation...    We are prescribing the antibiotic LEVAQUIN 500mg  take one tab daily x 7d...    Be sure to take the probiotic ALIGN one daily while you are on the antibiotic...  Add-in the OTC MUCINEX 600mg - 2 tabs twice daily w/ lots of fluids... You may use the OTC cough syrup DELSYM- 2 tsp twice daily as needed... We also wrote for the MAGIC MOUTHWASH- take one tsp - gargle & swallow up to 4 times daily as needed for your throat...  Today we did a follow up CXR to be sure there are no signs of pneumonia...    We will contact you w/ the results when available...   Call for any questions.Marland KitchenMarland Kitchen

## 2013-12-21 NOTE — Progress Notes (Signed)
Subjective:    Patient ID: [name], female    DOB: [birth year], 78 y.o.   MRN: [id number]  HPI 78 y/o WF here for a follow up visit... she has multiple medical problems including AR & Asthma;  HBP;  CAD followed by [name] & s/p 3 vessel CABG 11/11 by DrGearhardt;  Hyperlipidemia followed in the [facility];  GERD/ IBS/ Divertics;  Breast Cancer diagnosed 12/12;  Fibromyalgia;  Chr HAs & dizziness...  SEE PREV EPIC NOTES FOR OLDER DATA >>   ~  March 31, 2012:  22mo ROV & [name] admits that she's not feeling well today- c/o occipHA w/ band-like extension around her head, feels funny- sl dizzy & light headed, occuring daily; she has Tramadol for prn use but she says she doesn't like taking it- prefers Midrin (refilled), offered Neuro/HA referral & she will consider it...     AR/ Asthma>  She stopped the Symbicort as well & not on any inhaled meds at present; denies cough, sput, ch in dyspnea, etc.    HBP>  controlled on Metop50Bid & Hyzaar100-12.5 w/ BP=144/72 today; denies visual changes, CP, palipit, dizziness, syncope, dyspnea, edema, etc...     CAD>  CP 11/11=> cath w/ in-stent restenosis, then CABG x3 by DrGearhardt; re-hospitalized 6/12 by Cards for CP> cath showed severe 3 vessel CAD w/ 3/3 grafts patent & norm LVF w/ EF= 65-70%; Myoview 5/13 showed no ischemia & EF>80%; no change in meds- on Imdur30; & DrNishan noted that med rx is lim by side effects.    Hyperlipid>  followed in the [place] reviewed on Cres10- 2d per week but she was unable to tolerate even this low dose; off all meds on diet alone...    GI- GERD, Divertics> on Omep40; followed by DrPatterson & seen 9/13 f/u for ?diverticulitis- improved after Cipro/Flagyl but CTAbd was neg x diverticulosis; he rec high fiber diet & Citrucel...    Hx left breast cancer> she had f/u Oncology 12/13- back on Femara & tol ok; last mammogram 10/13 was ok as well...    Others>  she has persistant FM symptoms and chr HAs, uses Vicodin Prn... We  reviewed prob list, meds, xrays and labs> see below for updates >> she had the 2013 Flu vaccine 10/13...   EKG 9/13 showed NSR, rate64, NSSTTWA, NAD...   CXR 10/13 showed heart at upper lim of norm, prior CABG, mild hyperinflation, clear, NAD...  Abd Ultrasound 10/13 by [name] for eval CP showed> norm GB, liver, kidneys, & Ao- neg sonar...  LABS 10-12/13 showed Chems- wnl;  CBC- wnl  LABS 2/14:  FLP- ok x LDL=110;  TSH=1.33   ~  Jul 03, 2012:  28mo ROV & post hospital visit> [facility] 4/22-23/14 after she presented w/ a pill (B-complex vit) aspiration and wheezing, SOB, asthma exac; she was able to cough it all out & did not require bronchoscopy; she denied dysphagia, n/v, etc;  Still not feeling well, min residual cough, notes some neck pain (XRay showed Cspine spondylosis) & she wants MRI=> referral to NS or whatever is necessary; we reviewed Rx w/ Tramadol, Robaxin as well... Her CC today sounds like reactive hypoglycemia & we reviewed diet adjustment- low glycemic index foods, more freq small meals if nec, etc...     We reviewed prob list, meds, xrays and labs> see below for updates >>   CXR 4/14 showed norm heart size, s/p CABG, tort thor Ao, bilat apical pleural scarring, NAD.Marland KitchenMarland Kitchen  ~  September 29, 2012:  76mo ROV & [name] had her MRI CSpine 5/14> multilevel facet hypertrophy, foraminal narrowing & stenosis, disc osteophyte complexes, some central canal stenosis=> she was sent to NS, DrHirsh who rec Mobic & PT (helped alittle) but she was not pleased and is requesting a Rheumatology 2nd opinion about her neck discomfort; notes that insurance won't pay for musc relaxers...     She saw [name] for Cards 5/14> CAD, s/p CABG 2011, hx in-stent restenosis, unable to tol statins (even low dose intermittent rx); Myoview 5/13 was neg- no ischemia & EF>80%; no change in med rx...    She had f/u DrMagrinat 6/14> his note is reviewed- s/p lumpectomy, sentinel node, XRT; plan is to continue Femara2.5mg  thru 3/16 for  59yrs rx    She had GYN f/u 6/14 w/ [name]> doing satis x some dryness... We reviewed prob list, meds, xrays and labs> see below for updates >>   ~  February 03, 2013:  76mo ROV & Pat had Rheum consult [name]- last seen 9/14 & note reviewed> neck pain, this exac HAs/migraines, dizzy/ light headed; MRI=> NS consult & not a surg cand & NSAIDs/ PT w/ min benefit; saw Neurology- tried Pamelor but feels hung-over; for her DDD & OA they rec Flex5mg Tid which has helped some & she requests refill today- OK...     She saw [name] 9/14 after ER visit for HA> hx migraines and mixed HAs; CT Brain was neg & MRI CSpine showed multilevel DDD & some foraminal stenosis; Labs were neg including CRP & Sed; they tried Nortrip & Mobic but only min benefit...    She had routine f/u [name] 10/14> Hx left breast cancer dx 10/12 & treated w/ lumpectomy, XRT, & now Femara; exam neg, f/u mammogram neg, rec f/u 44yr... We reviewed prob list, meds, xrays and labs> see below for updates >> BP remains well controlled on her 3 meds; she had the 2014 flu vaccine 10/14; she continues on allergy shots at the [facility]...  ~  August 11, 2013:  79mo ROV & [name] reports that she is stable overall just lack energy so she's eating extra protein to help w/ this she tells me... She has had numerous specialty follow up visits in the interval>>    She had an allergy f/u w/ DrVanWinkle 3/15> AR on allergy shots, Allegra, Flonase, Saline; she is stable w/o recent exacerbation...    Hx Asthma on XopenexHFA as needed; she has done well w/o asthma exac, cough, sput, wheezing, or SOB...     She had a Cards f/u w/ drNishan 1/15> HBP, CAD, HL; on ASA81, Metop50Bid, Hyzaar100-12.5, Imdur30; Hx neg Myoview 5/13 & doing satis- no changes made; BP today= 136/68 & she denies CP, palpit, SOB, dizzy, edema, etc...    Lipids controlled on diet alone (w/ FishOil & CoQ10); Last FLP was 2/14 showing TChol 194, TG 143, HDL 55, LDL 110; reminded of diet, exercise, &  need for Fasting blood work...    She has seen DrHawkes for rheum and DrHirsh for NS in the past> told to f/u prn only...    She had a Neuro f/u 1/15> HAs followed by [name]; neg CTBrain & DDD on MRI Cspine; she refused to take Pamelor & uses Tramadol/ Flexeril as needed; no change in meds needed...     She saw Oncology 6/15 for f/u of her left Breast Cancer> on Femara2.5 & tol well, s/p lumpectomy & sentinel node bx 12/12, followed by XRT finished 3/13, & on Letrozole since then,  no known recurrence...  We reviewed prob list, meds, xrays and labs> see below for updates >>   ~  December 21, 2013:  53mo ROV & add-on requested for bronchitic exacerbation> [name] tells me that she noted the onset of cough, chest congestion, hoarse voice 7 min sore throat ~4d ago; she denied f/c/s but has chest soreness from the coughing 7 bringing up sm amt of yellow-green sput; of note she was recently (3wks ago) treated for a UTI (Proteus sens Cipro) & this resolved... Exam shows borderline BP= 160/78, otherw stable VS w/ O2sat95% on RA, sl pharyngeal erythema, no exudates, ears neg, no adenopathy, hoarse voice, chest exam w/ bilat rhonchi & exp wheezing... CXR shows norm heart size, s/pCABG, clear lungs w/o infiltrates... We discussed Rx w/ Depo80, Levaquin500 x7d, Mucinex 600-2Bid, Fluids, MMW & Delsym prn...     We reviewed prob list, meds, xrays and labs> see below for updates >>   CXR 10/15 showed normal heart size, s/p CABG, unchanged biapical pleural scarring, otherw clear lungs w/o infiltrates, NAD.Marland KitchenMarland Kitchen            Problem List:  GLAUCOMA (ICD-365.9) - she is sched for right cataract surg by DrBevis in Feb...  ALLERGIC RHINITIS (ICD-477.9) - she uses Allegra & Nasonex Prn; on Allergy shots weekly x yrs... ~  She is followed by DrVanWinkle at the [facility]=> she remains on allergy shots...  ASTHMA & Asthmatic Bronchitis >> off Symbicort & using XOPENEX HFA as needed rescue  inhaler... ~  5/11:  notes incr chest symptoms in the heat & rec to take the Symbicort 2sp Bid... ~  2012:  After her CABG & repeat hosp by Cards she is noted to be off her inhalers... ~  10/12:  Treated w/ Zpak, Pred, Mucinex, plus her Symbicort80 & improved... ~  CXR 5/13 showed s/p CABG, clear lungs, NAD (she went to the Er w/ cough & had neg eval). ~  7/13:  DrVanWinkle stopped her Symbicort & switched to [facility] HFA as needed... ~  2/14: she does not list any inhaled meds at present & states breathing OK w/o exac... ~  4/14: she was hosp after pill asp w/ asthma exac & improved w/ supportive Rx, didn't require bronch, CXR was clear, grad improved post hosp... ~  CXR 4/14 showed normal heart size, s/p CABG, sl tort Ao, bilat apic pleural scarring, NAD...  ~  6/15: she is stable w/o asthma exac and has avoided URIs/ bronchitis/ etc... ~  10/15: presented w/ URI/ bronchitic exac w/ cough, yellow-green sput, congestion, hoarseness, etc; CXR w/o signs of pneumonia & treated w/ Depo, Levaquin, Mucinex, MMW, Delsym, etc...   HYPERTENSION (ICD-401.9) - controlled on TOPROL 50mg Bid & HYZAAR 100-12.5 daily (off Lisinopril due to ACE cough)...  ~  8/12: BP=  128/84 & tol meds well; denies HA, fatigue, visual changes, CP, palipit, syncope, dyspnea, edema, etc... ~  2/13: BP= 140/64 & she remains essentially asymptomatic on Rx... ~  8/13:  BP= 132/64 & she denies CP, palpit, SOB, edema, etc... ~  2/14: controlled on Toprol & Hyzaar w/ BP 144/72 today; denies visual changes, CP, palipit, dizziness, syncope, dyspnea, edema, etc... ~  5/14:  BP= 136/60 & she remains stable... ~  8/14:  BP= 134/60 and she denies CP, palpit, dizzy, SOB, edema... ~  12/14: on Metop50Bid, Hyzaar 100-12.5, Imdur30;  BP= 140/80 & she remains largely asymptomatic... ~  6/15: on ASA81, Metop50Bid, Hyzaar100-12.5, Imdur30; BP today= 136/68 &  she denies CP, palpit, SOB, dizzy, edema, etc... ~  10/15: on same meds w/ BP= 160/78 & no  changes meds- advised to monitor BP at home once she's over the URI illness, take meds regualrly...  CORONARY ARTERY DISEASE (ICD-414.00) - on ASA 81mg /d (intol to 325 she says) & off Plavix per [name]...  Cardiolite 9/08 showed CP & HBP response, EF+85%... had cath 10/08 w/ single vessel dis w/ mod ostial LAD stenosis and patent LAD stent, normal LVF.[name]. option for med Rx vs off pump LIMA... she notes some CP/ pressure "burning" when walking up a certain hill during her daily walks, but states this is stable and "no worse"... ~  Myoview 9/09 was neg- no scar or ischemia, EF= 89%, hypertensive response- Lisinopril10 added. ~  f/u Myoview 3/11 was neg- no scar or ischemia, EF= 86%, norm BP response, fair exerc capacity. ~  saw [name] 5/11- no CP, he stopped her Plavix, stable- continue other meds same. ~  She had recurrent CP 11/11 w/ in-stent restenosis on cath> subseq 3 vessel CABG by DrGearhart & doing well since then... ~  6/12:  Hosp w/ CP & repeat cath showed CAD & 3/3 grafts patent, EF= 65-70% ~  Myoview 5/13 was felt to be a normal stress nuclear study- normal wall motion, no ischemia, EF=84% ~  2/14: CP 11/11=> cath w/ in-stent restenosis, then CABG x3 by DrGearhardt; re-hospitalized 6/12 by Cards for CP> cath showed severe 3 vessel CAD w/ 3/3 grafts patent & norm LVF w/ EF= 65-70%; Myoview 5/13 showed no ischemia & EF>80%; no change in meds; & [name] noted that med rx is limited by side effects... ~  8/14 & 12/14: she remains stable on Imdur30 + Metop50Bid 7 Hyzaar100-12.5; denies CP, palpit, SOB, etc... ~  6/15: Cards f/u w/ DrNishan 1/15> HBP, CAD, HL; on ASA81, Metop50Bid, Hyzaar100-12.5, Imdur30; Hx neg Myoview 5/13 & doing satis- no changes made; BP today= 136/68 & she denies CP, palpit, SOB, dizzy, edema, etc  HYPERLIPIDEMIA (ICD-272.4) - on CRESTOR 10mg - 1/2 on M&F, FISH OIL 1200mg  Tid + Flax Seed Oil... she has been intol to statins in the past>  tried low dose Trilipix but stopped  this due to "thigh burning">  now followed in the Lipid Clinic--- ~  [place] 7/08 showed TChol 217, TG 236, HDL 46, LDL 150...  ~  [place] 6/09 showed TChol 233, TG 263, HDL 46, LDL 150... she agrees to try LOW DOSE Trilipix. ~  FLP 11/09 showed TChol 162, TG 167, HDL 45, LDL 83... ~  [place] 11/10 showed TChol 240, TG 271, HDL 45, LDL 150... LipClin Rx w/ FishOil & FlaxSeedOil. ~  FLP 2/11 showed TChol 285, TG 156, HDL 79, LDL 180... LC is aware & working w/ her regularly. ~  [place] 5/11 showed TChol 277, TG 241, HDL55, LDL 193...  LC added Cres10 just 2d per week. ~  FLP 10/11 showed TChol 198, TG 215, HDL 53, LDL 109... much improved on Cres10 twice weekly. ~  She continues to f/u w/ Lipid clinic & they titrated up the Cres10 on M&F, plus 5mg  on Wednes> ~  FLP  10/12 was the best yet w/ TChol 167, TG 203, HDL 54, LDL 88...  ~  [place] 4/13 showed TChol 193, TG 176, HDL 57, LDL 101 ~  She is still followed in the [facility]- note 6/13 reviewed (at that time on Cres10 on M&F, 5mg  on W) but she subseq cut herself down to 1/2 on M&F due  to nausea & aching. ~  FLP 2/14 on diet alone showed TChol 194, TG 143, HDL 55, LDL 110... We reviewed diet, exercise, etc... ~  She remains off the [place] on diet alone and she declines to restart low dose rx "I can't tolerate them"  GERD (ICD-530.81) - on OMEPRAZOLE 40mg /d... last EGD by DrPatterson 8/07 revealed sm HH, & gastritis and dilatation done... (RUT=neg, PPI Rx)... LEVSIN 0.125mg  helps her esoph spasm... Prn Phenergan for nausea.  DIVERTICULOSIS, COLON (ICD-562.10) & IRRITABLE BOWEL SYNDROME (ICD-564.1) ~  colonoscopy 2/03 by DrPatterson showed divertics and 5 mm polyp... ~  colonoscopy 3/11 showed divertics & cecal polyp= tubular adenoma, w/ f/u planned 43yrs. ~  9/13: seen by DrPatterson f/u for ?diverticulitis- improved after Cipro/Flagyl but CTAbd was neg x diverticulosis; he rec high fiber diet & Citrucel...  ? GALLSTONES>>  Diagnosed by DrPatterson and referred to [facility]  DrWilson, pt asked to call prn fopr incr symptoms to consider surg... ~  Abd Ultrasound 10/13 by [name] for eval CP showed> norm GB, liver, kidneys, & Ao- neg sonar.  BREAST CANCER >> Abn mammogram 10/12 showing a left breast nodule & subseq surg (partial mastectomy & sentinel node bx) by [name] 12/12 that proved to be an invasive ductal carcinoma, 2.3cm size, w/ neg lymphovasc invasion, clear margins (but close at 2mm), & neg nodes; she had XRT by DrKindard (had her 11th treatment today); and Oncology eval by DrMagrinat who plans hormone Rx later (no chemo she says)...  ~  2/13: She is tolerating treatment well & spirits are up, [name] had to aspirate a seroma & it is now resolved... ~  She is followed by DrMagrinat, Streck, Kinard- now on [facility] 2.5mg /d w/ 64yrs planned... ~  6/14:  She had f/u DrMagrinat 6/14> his note is reviewed- s/p lumpectomy, sentinel node, XRT; plan is to continue Femara2.5mg  thru 3/16 for 7yrs rx. ~  10/14:  She had routine f/u [name] 10/14> Hx left breast cancer dx 10/12 & treated w/ lumpectomy, XRT, & now Femara; exam neg, f/u mammogram neg, rec f/u 74yr. ~  6/15:  She saw Oncology for f/u of her left Breast Cancer> on Femara2.5 & tol well, s/p lumpectomy & sentinel node bx 12/12, followed by XRT finished 3/13, & on Letrozole since then, no known recurrence...   DJD, Neck Pain >> ~  XRays Cspine 4/14 in hosp showed multilevel cerv spondylosis, 1-32mm anterolisthesis C4 on C5, DDD, foraminal stenosis at C5-6...  ~  5/14:  MRI Cspine => multilevel facet hypertrophy, foraminal narrowing & stenosis, disc osteophyte complexes, some central canal stenosis=> she was sent to NS, DrHirsh who rec Mobic & PT (helped alittle) but she was not pleased... ~  8/14:  She is requesting a Rheumatology 2nd opinion about her neck discomfort=> seen by [facility]- neck pain, this exac HAs/migraines, dizzy/ light headed; MRI=> NS consult & not a surg cand & NSAIDs/ PT w/ min benefit; saw  Neurology- tried Pamelor but feels hung-over; for her DDD & OA they rec Flex5mg Tid which has helped some...  FIBROMYALGIA (ICD-729.1) - she c/o chr fatigue, aching/ sore, on Tramadol & Tylenol, but most benefit from low dose Hydrocodone ~1/2 tab Prn... I have recommended an increase exercise program to her... ~  5/11:  Vit D level = 50 on 50000 u weekly by Helene Shoe, NP  HEADACHE (ICD-784.0) - eval at [facility] in 2002, but she notes Rx didn't help... ~  3/11:  presented w/ muscle contraction HA's> Rx Tramadol 50mg  Q6H  PrnJodene Nam, CHRONIC (ICD-780.4) - MRI 2/09 per [name] showed atrophy, sm vessel dis, NAD...  Health Maintenance: ~  GI:  DrPatterson & up to date on colon screening. ~  GYN:  yearly f/u w/ Helene Shoe, NP for PAP, Mammogram at [facility], ?last BMD. ~  Immunizations:  yearly Flu vaccines in fall, Pneumovax in 2007?, TDAP given 5/11...   Past Surgical History  Procedure Laterality Date  . Coronary angioplasty with stent placement      Stent 2007  . Open heart surgery      01/19/2010  . Coronary artery bypass graft    . Cataract extraction, bilateral      bilateral caaract removal,  . Breast lumpectomy Left 02/06/11    Outpatient Encounter Prescriptions as of 12/21/2013  Medication Sig  . aspirin 81 MG tablet Take 81 mg by mouth daily.    . fexofenadine (ALLEGRA) 180 MG tablet Take 180 mg by mouth daily.    . fluocinonide (LIDEX) 0.05 % external solution   . Glucosamine-Chondroit-Vit C-Mn (GLUCOSAMINE CHONDR 1500 COMPLX PO) Take 1 tablet by mouth 3 (three) times daily.    . hyoscyamine (ANASPAZ) 0.125 MG TBDP tablet PLACE 1 TABLET UNDER THE TONGUE EVERY 6 HOURS AS NEEDED.  [name] isosorbide mononitrate (IMDUR) 30 MG 24 hr tablet Take 1 tablet (30 mg total) by mouth daily.  [name] ketoconazole (NIZORAL) 2 % shampoo   . latanoprost (XALATAN) 0.005 % ophthalmic solution Place 1 drop into both eyes at bedtime.   [name] letrozole (FEMARA) 2.5 MG tablet Take 1 tablet  (2.5 mg total) by mouth daily.  [name] losartan-hydrochlorothiazide (HYZAAR) 100-12.5 MG per tablet Take 1 tablet by mouth daily.  . metoprolol (LOPRESSOR) 50 MG tablet Take 1 tablet (50 mg total) by mouth 2 (two) times daily.  . mometasone (NASONEX) 50 MCG/ACT nasal spray 2 sprays by Nasal route daily.    . nitroGLYCERIN (NITROSTAT) 0.4 MG SL tablet Take one tablet  By mouth SL every 5 minutes for a total of 3 doses as needed for chest pain.  . Omega-3 Fatty Acids (FISH OIL) 1200 MG CAPS 2 tabs po bid   . omeprazole (PRILOSEC) 40 MG capsule take 1 capsule by mouth once daily 30 MINUTES BEFORE THE FIRST MEAL OF THE DAY  . tizanidine (ZANAFLEX) 2 MG capsule Take 1 capsule (2 mg total) by mouth 2 (two) times daily.  . vitamin B-12 (CYANOCOBALAMIN) 1000 MCG tablet Take 1,000 mcg by mouth daily.    . Diphenhyd-Hydrocort-Nystatin (FIRST-DUKES MOUTHWASH) SUSP 1 tsp gargle and swallow four times daily as needed for your throat  . levofloxacin (LEVAQUIN) 500 MG tablet Take 1 tablet (500 mg total) by mouth daily.  . [DISCONTINUED] ciprofloxacin (CIPRO) 250 MG tablet Take 1 tablet (250 mg total) by mouth 2 (two) times daily.  . [DISCONTINUED] metroNIDAZOLE (FLAGYL) 250 MG tablet Take 1 tablet by mouth 3 times daily with meals    Allergies  Allergen Reactions  . Choline Fenofibrate Other (See Comments)    REACTION: pt states INTOL to Trilipix w/ "thigh burning"  . Simvastatin Other (See Comments)    REACTION: pt states INTOL to STATINS \\T \ refuses to restart  . Hydrocodone     Nightmare after taking cough syrup w/hydrocodone  . Adhesive [Tape] Rash  . Ceclor [Cefaclor] Rash  . Clarithromycin Rash  . Codeine Nausea Only  . Doxycycline Rash  . Lisinopril Cough    Developed ACE cough...  . Penicillins Itching and Rash    At injection  site  . Tobramycin-Dexamethasone Rash    Current Medications, Allergies, Past Medical History, Past Surgical History, Family History, and Social History were reviewed in  [employer] record.    Review of Systems    See HPI - all other systems neg except as noted...  The patient complains of chest pain and headaches.  The patient denies anorexia, fever, weight loss, weight gain, vision loss, decreased hearing, hoarseness, syncope, dyspnea on exertion, peripheral edema, prolonged cough, hemoptysis, abdominal pain, melena, hematochezia, severe indigestion/heartburn, hematuria, incontinence, muscle weakness, suspicious skin lesions, transient blindness, difficulty walking, depression, unusual weight change, abnormal bleeding, enlarged lymph nodes, and angioedema.   Objective:   Physical Exam    WD, WN, 78 y/o WF in NAD... GENERAL:  Alert & oriented; pleasant & cooperative... HEENT:  [place]/AT, EOM-wnl, PERRLA, EACs-clear, TMs-wnl, NOSE-clear, THROAT- sl erythema w/o exud NECK:  Decr ROM; no JVD; normal carotid impulses w/o bruits; no thyromegaly or nodules palpated; no lymphadenopathy. CHEST:  Bilat rhonchi, exp wheezing, no signs of consolidation... HEART:  Regular Rhythm; without murmurs/ rubs/ or gallops detected... ABDOMEN:  Soft & nontender; normal bowel sounds; no organomegaly or masses palpated... EXT: without deformities, mild arthritic changes and +trigger points, no varicose veins/ venous insuffic/ or edema. NEURO:  CN's intact; motor testing normal; sensory testing normal; gait normal & balance OK. DERM:  No lesions noted; no rash etc...  RADIOLOGY DATA:  Reviewed in the EPIC EMR & discussed w/ the patient...  LABORATORY DATA:  Reviewed in the EPIC EMR & discussed w/ the patient...   Assessment & Plan:   URI/ Bronchitic exac 10/15>>  Treated w/ Depo, Levaquin, Mucinex, MMW, Delsym... Asthma/ AR>  Stable on allergy shots & XOPENEX as needed; no recent exac... Hx pill asp w/ asthma exac=> resolved...  HBP>  Controlled on Toprol, Imdur & Hyzaar, continue same...  CAD>  Cath w/ patent grafts; had neg Nuclear study; continue  same meds and f/u DrNishan...  CHOL>  Followed in the Lipid Clinic & intol to all meds- FLP looks better however on diet alone...  GI> GERD, Divertics, ?Gallstones>  Followed by [facility] & stones eval by DrWilson [facility]; odd that f/u sonar 9/13 did not show stones...  BREAST CANCER>  on FEMARA > treated by [name], DrMagrinat, DrKinard & records reviewed...  FM/ HAs/ etc>  On Ultram, Tylenol, etc... NECK PAIN> with multilevel spondylosis & eval by NS, DrHirsh but she was not pleased & is asking for Rheum eval...  Other medical issues as noted> on CoQ10, Glucosamine, Vit B12, Vit D, etc...   Patient's Medications  New Prescriptions   DIPHENHYD-HYDROCORT-NYSTATIN (FIRST-DUKES MOUTHWASH) SUSP    1 tsp gargle and swallow four times daily as needed for your throat   LEVOFLOXACIN (LEVAQUIN) 500 MG TABLET    Take 1 tablet (500 mg total) by mouth daily.  Previous Medications   ASPIRIN 81 MG TABLET    Take 81 mg by mouth daily.     FEXOFENADINE (ALLEGRA) 180 MG TABLET    Take 180 mg by mouth daily.     FLUOCINONIDE (LIDEX) 0.05 % EXTERNAL SOLUTION       GLUCOSAMINE-CHONDROIT-VIT C-MN (GLUCOSAMINE CHONDR 1500 COMPLX PO)    Take 1 tablet by mouth 3 (three) times daily.     HYOSCYAMINE (ANASPAZ) 0.125 MG TBDP TABLET    PLACE 1 TABLET UNDER THE TONGUE EVERY 6 HOURS AS NEEDED.   ISOSORBIDE MONONITRATE (IMDUR) 30 MG 24 HR TABLET    Take 1 tablet (30 mg  total) by mouth daily.   KETOCONAZOLE (NIZORAL) 2 % SHAMPOO       LATANOPROST (XALATAN) 0.005 % OPHTHALMIC SOLUTION    Place 1 drop into both eyes at bedtime.    LETROZOLE (FEMARA) 2.5 MG TABLET    Take 1 tablet (2.5 mg total) by mouth daily.   LOSARTAN-HYDROCHLOROTHIAZIDE (HYZAAR) 100-12.5 MG PER TABLET    Take 1 tablet by mouth daily.   METOPROLOL (LOPRESSOR) 50 MG TABLET    Take 1 tablet (50 mg total) by mouth 2 (two) times daily.   MOMETASONE (NASONEX) 50 MCG/ACT NASAL SPRAY    2 sprays by Nasal route daily.     NITROGLYCERIN (NITROSTAT) 0.4 MG SL  TABLET    Take one tablet  By mouth SL every 5 minutes for a total of 3 doses as needed for chest pain.   OMEGA-3 FATTY ACIDS (FISH OIL) 1200 MG CAPS    2 tabs po bid    OMEPRAZOLE (PRILOSEC) 40 MG CAPSULE    take 1 capsule by mouth once daily 30 MINUTES BEFORE THE FIRST MEAL OF THE DAY   TIZANIDINE (ZANAFLEX) 2 MG CAPSULE    Take 1 capsule (2 mg total) by mouth 2 (two) times daily.   VITAMIN B-12 (CYANOCOBALAMIN) 1000 MCG TABLET    Take 1,000 mcg by mouth daily.    Modified Medications   No medications on file  Discontinued Medications   CIPROFLOXACIN (CIPRO) 250 MG TABLET    Take 1 tablet (250 mg total) by mouth 2 (two) times daily.   METRONIDAZOLE (FLAGYL) 250 MG TABLET    Take 1 tablet by mouth 3 times daily with meals

## 2013-12-22 ENCOUNTER — Encounter: Payer: Self-pay | Admitting: Pulmonary Disease

## 2013-12-23 ENCOUNTER — Emergency Department (HOSPITAL_BASED_OUTPATIENT_CLINIC_OR_DEPARTMENT_OTHER): Payer: Medicare Other

## 2013-12-23 ENCOUNTER — Encounter (HOSPITAL_BASED_OUTPATIENT_CLINIC_OR_DEPARTMENT_OTHER): Payer: Self-pay | Admitting: Emergency Medicine

## 2013-12-23 ENCOUNTER — Inpatient Hospital Stay (HOSPITAL_BASED_OUTPATIENT_CLINIC_OR_DEPARTMENT_OTHER)
Admission: EM | Admit: 2013-12-23 | Discharge: 2013-12-27 | DRG: 640 | Disposition: A | Discharge status: Home / Self Care | Payer: Medicare Other | Attending: Internal Medicine | Admitting: Internal Medicine

## 2013-12-23 ENCOUNTER — Telehealth: Payer: Self-pay | Admitting: Pulmonary Disease

## 2013-12-23 DIAGNOSIS — G9341 Metabolic encephalopathy: Secondary | ICD-10-CM | POA: Diagnosis present

## 2013-12-23 DIAGNOSIS — Z881 Allergy status to other antibiotic agents status: Secondary | ICD-10-CM

## 2013-12-23 DIAGNOSIS — J45909 Unspecified asthma, uncomplicated: Secondary | ICD-10-CM | POA: Diagnosis present

## 2013-12-23 DIAGNOSIS — I2510 Atherosclerotic heart disease of native coronary artery without angina pectoris: Secondary | ICD-10-CM | POA: Diagnosis present

## 2013-12-23 DIAGNOSIS — E782 Mixed hyperlipidemia: Secondary | ICD-10-CM | POA: Diagnosis present

## 2013-12-23 DIAGNOSIS — J209 Acute bronchitis, unspecified: Secondary | ICD-10-CM | POA: Diagnosis present

## 2013-12-23 DIAGNOSIS — I161 Hypertensive emergency: Secondary | ICD-10-CM | POA: Diagnosis present

## 2013-12-23 DIAGNOSIS — M4302 Spondylolysis, cervical region: Secondary | ICD-10-CM

## 2013-12-23 DIAGNOSIS — R079 Chest pain, unspecified: Secondary | ICD-10-CM

## 2013-12-23 DIAGNOSIS — M47892 Other spondylosis, cervical region: Secondary | ICD-10-CM | POA: Diagnosis present

## 2013-12-23 DIAGNOSIS — E861 Hypovolemia: Secondary | ICD-10-CM | POA: Diagnosis present

## 2013-12-23 DIAGNOSIS — Z9842 Cataract extraction status, left eye: Secondary | ICD-10-CM

## 2013-12-23 DIAGNOSIS — Z951 Presence of aortocoronary bypass graft: Secondary | ICD-10-CM

## 2013-12-23 DIAGNOSIS — R42 Dizziness and giddiness: Secondary | ICD-10-CM

## 2013-12-23 DIAGNOSIS — J439 Emphysema, unspecified: Secondary | ICD-10-CM | POA: Diagnosis not present

## 2013-12-23 DIAGNOSIS — Z853 Personal history of malignant neoplasm of breast: Secondary | ICD-10-CM

## 2013-12-23 DIAGNOSIS — K219 Gastro-esophageal reflux disease without esophagitis: Secondary | ICD-10-CM | POA: Diagnosis present

## 2013-12-23 DIAGNOSIS — J440 Chronic obstructive pulmonary disease with acute lower respiratory infection: Secondary | ICD-10-CM | POA: Diagnosis not present

## 2013-12-23 DIAGNOSIS — I10 Essential (primary) hypertension: Secondary | ICD-10-CM

## 2013-12-23 DIAGNOSIS — J441 Chronic obstructive pulmonary disease with (acute) exacerbation: Secondary | ICD-10-CM | POA: Diagnosis present

## 2013-12-23 DIAGNOSIS — G9340 Encephalopathy, unspecified: Secondary | ICD-10-CM | POA: Diagnosis not present

## 2013-12-23 DIAGNOSIS — Z7982 Long term (current) use of aspirin: Secondary | ICD-10-CM

## 2013-12-23 DIAGNOSIS — R1110 Vomiting, unspecified: Secondary | ICD-10-CM | POA: Diagnosis not present

## 2013-12-23 DIAGNOSIS — Z9841 Cataract extraction status, right eye: Secondary | ICD-10-CM

## 2013-12-23 DIAGNOSIS — H409 Unspecified glaucoma: Secondary | ICD-10-CM | POA: Diagnosis present

## 2013-12-23 DIAGNOSIS — R05 Cough: Secondary | ICD-10-CM | POA: Diagnosis not present

## 2013-12-23 DIAGNOSIS — M4316 Spondylolisthesis, lumbar region: Secondary | ICD-10-CM

## 2013-12-23 DIAGNOSIS — R4182 Altered mental status, unspecified: Secondary | ICD-10-CM | POA: Diagnosis present

## 2013-12-23 DIAGNOSIS — M1990 Unspecified osteoarthritis, unspecified site: Secondary | ICD-10-CM | POA: Diagnosis present

## 2013-12-23 DIAGNOSIS — I25701 Atherosclerosis of coronary artery bypass graft(s), unspecified, with angina pectoris with documented spasm: Secondary | ICD-10-CM | POA: Diagnosis present

## 2013-12-23 DIAGNOSIS — E871 Hypo-osmolality and hyponatremia: Principal | ICD-10-CM | POA: Diagnosis present

## 2013-12-23 DIAGNOSIS — Z79899 Other long term (current) drug therapy: Secondary | ICD-10-CM | POA: Diagnosis not present

## 2013-12-23 DIAGNOSIS — Z880 Allergy status to penicillin: Secondary | ICD-10-CM | POA: Diagnosis not present

## 2013-12-23 DIAGNOSIS — M797 Fibromyalgia: Secondary | ICD-10-CM | POA: Diagnosis present

## 2013-12-23 DIAGNOSIS — R51 Headache: Secondary | ICD-10-CM | POA: Diagnosis not present

## 2013-12-23 DIAGNOSIS — R519 Headache, unspecified: Secondary | ICD-10-CM | POA: Diagnosis present

## 2013-12-23 LAB — CBC WITH DIFFERENTIAL/PLATELET
BASOS ABS: 0 10*3/uL (ref 0.0–0.1)
BASOS PCT: 0 % (ref 0–1)
EOS ABS: 0.2 10*3/uL (ref 0.0–0.7)
Eosinophils Relative: 2 % (ref 0–5)
HCT: 36.2 % (ref 36.0–46.0)
Hemoglobin: 13 g/dL (ref 12.0–15.0)
LYMPHS ABS: 3.9 10*3/uL (ref 0.7–4.0)
Lymphocytes Relative: 37 % (ref 12–46)
MCH: 30.4 pg (ref 26.0–34.0)
MCHC: 35.9 g/dL (ref 30.0–36.0)
MCV: 84.8 fL (ref 78.0–100.0)
Monocytes Absolute: 0.8 10*3/uL (ref 0.1–1.0)
Monocytes Relative: 8 % (ref 3–12)
NEUTROS PCT: 53 % (ref 43–77)
Neutro Abs: 5.5 10*3/uL (ref 1.7–7.7)
PLATELETS: 296 10*3/uL (ref 150–400)
RBC: 4.27 MIL/uL (ref 3.87–5.11)
RDW: 12.9 % (ref 11.5–15.5)
WBC: 10.4 10*3/uL (ref 4.0–10.5)

## 2013-12-23 LAB — URINALYSIS, ROUTINE W REFLEX MICROSCOPIC
Bilirubin Urine: NEGATIVE
Glucose, UA: NEGATIVE mg/dL
Hgb urine dipstick: NEGATIVE
KETONES UR: 15 mg/dL — AB
LEUKOCYTES UA: NEGATIVE
NITRITE: NEGATIVE
Protein, ur: NEGATIVE mg/dL
Specific Gravity, Urine: 1.009 (ref 1.005–1.030)
Urobilinogen, UA: 0.2 mg/dL (ref 0.0–1.0)
pH: 7.5 (ref 5.0–8.0)

## 2013-12-23 LAB — BASIC METABOLIC PANEL
ANION GAP: 22 — AB (ref 5–15)
BUN: 13 mg/dL (ref 6–23)
CO2: 22 mEq/L (ref 19–32)
Calcium: 9.8 mg/dL (ref 8.4–10.5)
Chloride: 75 mEq/L — ABNORMAL LOW (ref 96–112)
Creatinine, Ser: 0.5 mg/dL (ref 0.50–1.10)
GFR calc Af Amer: >90 mL/min (ref >90)
Glucose, Bld: 129 mg/dL — ABNORMAL HIGH (ref 70–99)
POTASSIUM: 3.1 meq/L — AB (ref 3.7–5.3)
SODIUM: 119 meq/L — AB (ref 137–147)

## 2013-12-23 LAB — TROPONIN I

## 2013-12-23 LAB — MRSA PCR SCREENING: MRSA by PCR: NEGATIVE

## 2013-12-23 MED ORDER — SODIUM CHLORIDE 0.9 % IV SOLN
INTRAVENOUS | Status: DC
  Administered 2013-12-24: via INTRAVENOUS

## 2013-12-23 MED ORDER — ISOSORBIDE MONONITRATE ER 30 MG PO TB24
30.0000 mg | ORAL_TABLET | Freq: Every day | ORAL | Status: DC
  Filled 2013-12-23: qty 1

## 2013-12-23 MED ORDER — TIZANIDINE HCL 2 MG PO TABS
2.0000 mg | ORAL_TABLET | Freq: Two times a day (BID) | ORAL | Status: DC
  Filled 2013-12-23 (×3): qty 1

## 2013-12-23 MED ORDER — LABETALOL HCL 5 MG/ML IV SOLN
INTRAVENOUS | Status: AC
  Filled 2013-12-23: qty 4

## 2013-12-23 MED ORDER — PANTOPRAZOLE SODIUM 40 MG PO TBEC
80.0000 mg | DELAYED_RELEASE_TABLET | Freq: Every day | ORAL | Status: DC
  Filled 2013-12-23: qty 2

## 2013-12-23 MED ORDER — SODIUM CHLORIDE 0.9 % IV SOLN
INTRAVENOUS | Status: DC
  Administered 2013-12-23: 19:00:00 via INTRAVENOUS

## 2013-12-23 MED ORDER — HYDRALAZINE HCL 20 MG/ML IJ SOLN: 10.0000 mg | INTRAMUSCULAR | Status: DC | PRN

## 2013-12-23 MED ORDER — LORATADINE 10 MG PO TABS
10.0000 mg | ORAL_TABLET | Freq: Every day | ORAL | Status: DC
  Filled 2013-12-23: qty 1

## 2013-12-23 MED ORDER — POTASSIUM CHLORIDE CRYS ER 20 MEQ PO TBCR
40.0000 meq | EXTENDED_RELEASE_TABLET | Freq: Two times a day (BID) | ORAL | Status: DC
  Administered 2013-12-24: 20 meq via ORAL
  Administered 2013-12-24: 40 meq via ORAL
  Filled 2013-12-23 (×3): qty 2

## 2013-12-23 MED ORDER — ACETAMINOPHEN 325 MG PO TABS
650.0000 mg | ORAL_TABLET | Freq: Four times a day (QID) | ORAL | Status: DC | PRN
  Administered 2013-12-24: 650 mg via ORAL
  Filled 2013-12-23: qty 2

## 2013-12-23 MED ORDER — LATANOPROST 0.005 % OP SOLN
1.0000 [drp] | Freq: Every day | OPHTHALMIC | Status: DC
  Administered 2013-12-24 – 2013-12-26 (×3): 1 [drp] via OPHTHALMIC
  Filled 2013-12-23: qty 2.5

## 2013-12-23 MED ORDER — FENTANYL CITRATE 0.05 MG/ML IJ SOLN
50.0000 ug | Freq: Once | INTRAMUSCULAR | Status: AC
  Administered 2013-12-23: 50 ug via INTRAVENOUS
  Filled 2013-12-23: qty 2

## 2013-12-23 MED ORDER — METOPROLOL TARTRATE 50 MG PO TABS
50.0000 mg | ORAL_TABLET | Freq: Two times a day (BID) | ORAL | Status: DC
  Filled 2013-12-23 (×3): qty 1

## 2013-12-23 MED ORDER — ONDANSETRON HCL 4 MG/2ML IJ SOLN
4.0000 mg | Freq: Once | INTRAMUSCULAR | Status: AC
  Administered 2013-12-23: 4 mg via INTRAVENOUS
  Filled 2013-12-23: qty 2

## 2013-12-23 MED ORDER — HEPARIN SODIUM (PORCINE) 5000 UNIT/ML IJ SOLN
5000.0000 [IU] | Freq: Three times a day (TID) | INTRAMUSCULAR | Status: DC
  Administered 2013-12-24 – 2013-12-27 (×11): 5000 [IU] via SUBCUTANEOUS
  Filled 2013-12-23 (×14): qty 1

## 2013-12-23 MED ORDER — FLUTICASONE PROPIONATE 50 MCG/ACT NA SUSP
2.0000 | Freq: Every day | NASAL | Status: DC
  Administered 2013-12-25 – 2013-12-27 (×3): 2 via NASAL
  Filled 2013-12-23: qty 16

## 2013-12-23 MED ORDER — ASPIRIN EC 81 MG PO TBEC
81.0000 mg | DELAYED_RELEASE_TABLET | Freq: Every day | ORAL | Status: DC
  Filled 2013-12-23: qty 1

## 2013-12-23 MED ORDER — LETROZOLE 2.5 MG PO TABS
2.5000 mg | ORAL_TABLET | Freq: Every day | ORAL | Status: DC
  Filled 2013-12-23: qty 1

## 2013-12-23 MED ORDER — LABETALOL HCL 5 MG/ML IV SOLN
10.0000 mg | INTRAVENOUS | Status: DC | PRN
  Administered 2013-12-23: 10 mg via INTRAVENOUS
  Filled 2013-12-23: qty 4

## 2013-12-23 MED ORDER — LOSARTAN POTASSIUM 50 MG PO TABS
100.0000 mg | ORAL_TABLET | Freq: Every day | ORAL | Status: DC
  Administered 2013-12-24: 100 mg via ORAL
  Filled 2013-12-23: qty 2

## 2013-12-23 MED ORDER — SODIUM CHLORIDE 0.9 % IV BOLUS (SEPSIS)
500.0000 mL | Freq: Once | INTRAVENOUS | Status: AC
  Administered 2013-12-23: 500 mL via INTRAVENOUS

## 2013-12-23 NOTE — ED Notes (Signed)
MD at bedside. 

## 2013-12-23 NOTE — ED Notes (Signed)
Patient transported to CT 

## 2013-12-23 NOTE — ED Notes (Signed)
Received phone call from Lelon Frohlich, RN on [facility] unit and gave report.

## 2013-12-23 NOTE — ED Notes (Signed)
MD at bedside discussing test results and plan of care for admission.

## 2013-12-23 NOTE — ED Notes (Signed)
Called the [facility] floor again to give report and nurse is not available.  They state they will have the nurse call me back for report.  Report was given to [facility] RN prior to transport.

## 2013-12-23 NOTE — ED Notes (Signed)
Called back in to room-pt feeling nauseous-EDP notified-orders for zofran-pt vomited x 1 while I was obtaining med-denied need for pain med

## 2013-12-23 NOTE — ED Notes (Signed)
Thinks she is having an allergic reaction to Mucinex.  Seen by PMD on 10/27, diagnosed with bronchitis and statred Levaquin.  No improvement.  Cough, chest burning and headache.

## 2013-12-23 NOTE — ED Notes (Signed)
Attempted to call report to [facility] but the receiving nurse is not available.  They asked that I call back in 10 min.

## 2013-12-23 NOTE — Telephone Encounter (Signed)
Called and spke with pt and she stated that she started with the mucinex and this seemed to dry her out too much.  She then realized that she was taking the 120 mg tablet.  She is aware to increase her fluids and she stated that she will get the smaller dose of the mucinex.   Nothing further is needed.

## 2013-12-23 NOTE — Evaluation (Signed)
[facility] transfer EDP: Ward  Reason for admit: hyponatremia Pt with bronchitic sxs. Saw her PCP 1 week ago. Was placed on levaquin, given steroid shot and mucinex. Pt with worsening HA and CP. EKG unchanged from previous. Trop neg. Head CT WNL. CXR shows emphysema. Na 119. K 3.1. On diuretic. UA + ketones. Otherwise bland. Clinically dry on exam per EDP. Given 500 cc bolus. Asking admission for hyponatremia. Accepted to tele bed.

## 2013-12-23 NOTE — ED Provider Notes (Signed)
TIME SEEN: 5:30 PM  CHIEF COMPLAINT: Headache, chest pain, cough  HPI: Patient is a 78 year old female with history of hypertension, hyperlipidemia, chronic headaches, CAD, fibromyalgia who presents to the emergency department with multiple complaints. She reports that she has had a nonproductive cough for 2 days and was seen by her PCP Dr. Lenna Gilford and had a chest x-ray and was diagnosed with bronchitis. She was given a steroid injection and started on Levaquin. She is also told to take over-the-counter Mucinex. She states that she feels that the Mucinex is "too strong for her". She states she started having a diffuse burning headache that started posteriorly and moved to the front of her head. She has had similar headaches multiple times in the past and had to come to the hospital for treatment before. She states that her headaches were thought to come from cervical arthritis. She states she is having tingling in bilateral hands but no focal weakness. She states she feels like "I can't relax". She is also describing a burning chest pain. No shortness of breath. No nausea, vomiting or diarrhea. No diaphoresis or dizziness.  ROS: See HPI Constitutional: no fever  Eyes: no drainage  ENT: no runny nose   Cardiovascular:  no chest pain  Resp: no SOB  GI: no vomiting GU: no dysuria Integumentary: no rash  Allergy: no hives  Musculoskeletal: no leg swelling  Neurological: no slurred speech ROS otherwise negative  PAST MEDICAL HISTORY/PAST SURGICAL HISTORY:  Past Medical History  Diagnosis Date  . Unspecified glaucoma   . Allergic rhinitis, cause unspecified   . Unspecified asthma(493.90)   . Unspecified essential hypertension   . Other and unspecified hyperlipidemia   . Gastritis   . Abdominal pain, left lower quadrant   . Diverticulosis of colon (without mention of hemorrhage)   . Irritable bowel syndrome   . Colonic polyp 04-27-2009    tubular adenoma  . Headache(784.0)   . Dizziness   .  Breast cancer, Left 12/20/2010  . Gallstones   . CAD (coronary artery disease)     a. 01/19/2010 s/p CABG x 3, lima->lad, vg->diag, vg->om1;  b. 06/2011 :Lexi MV: EF 84%, No ischemia.  . Bronchitis     hx of  . Pneumonia   . Hiatal hernia   . Fibromyalgia   . Esophageal reflux     hiatal hernia  . Atrophic vaginitis   . GERD (gastroesophageal reflux disease)   . Hypercholesterolemia   . Breast cancer   . Glaucoma   . Cervical arthritis     MEDICATIONS:  Prior to Admission medications   Medication Sig Start Date End Date Taking? Authorizing Provider  aspirin 81 MG tablet Take 81 mg by mouth daily.      Historical Provider, MD  Diphenhyd-Hydrocort-Nystatin (FIRST-DUKES MOUTHWASH) SUSP 1 tsp gargle and swallow four times daily as needed for your throat 12/21/13   Noralee Space, MD  fexofenadine (ALLEGRA) 180 MG tablet Take 180 mg by mouth daily.      Historical Provider, MD  fluocinonide (LIDEX) 0.05 % external solution  04/29/13   Historical Provider, MD  Glucosamine-Chondroit-Vit C-Mn (GLUCOSAMINE CHONDR 1500 COMPLX PO) Take 1 tablet by mouth 3 (three) times daily.      Historical Provider, MD  hyoscyamine (ANASPAZ) 0.125 MG TBDP tablet PLACE 1 TABLET UNDER THE TONGUE EVERY 6 HOURS AS NEEDED. 10/31/12   Amy S Esterwood, PA-C  isosorbide mononitrate (IMDUR) 30 MG 24 hr tablet Take 1 tablet (30 mg total) by mouth  daily. 12/13/13   Josue Hector, MD  ketoconazole (NIZORAL) 2 % shampoo  04/29/13   Historical Provider, MD  latanoprost (XALATAN) 0.005 % ophthalmic solution Place 1 drop into both eyes at bedtime.  07/31/12   Historical Provider, MD  letrozole (FEMARA) 2.5 MG tablet Take 1 tablet (2.5 mg total) by mouth daily. 07/27/13   Amy Milda Smart, PA-C  levofloxacin (LEVAQUIN) 500 MG tablet Take 1 tablet (500 mg total) by mouth daily. 12/21/13   Noralee Space, MD  losartan-hydrochlorothiazide (HYZAAR) 100-12.5 MG per tablet Take 1 tablet by mouth daily. 05/17/13   Josue Hector, MD  metoprolol  (LOPRESSOR) 50 MG tablet Take 1 tablet (50 mg total) by mouth 2 (two) times daily. 09/27/13   Josue Hector, MD  mometasone (NASONEX) 50 MCG/ACT nasal spray 2 sprays by Nasal route daily.      Historical Provider, MD  nitroGLYCERIN (NITROSTAT) 0.4 MG SL tablet Take one tablet  By mouth SL every 5 minutes for a total of 3 doses as needed for chest pain. 07/15/12   Josue Hector, MD  Omega-3 Fatty Acids (FISH OIL) 1200 MG CAPS 2 tabs po bid     Historical Provider, MD  omeprazole (PRILOSEC) 40 MG capsule take 1 capsule by mouth once daily 30 MINUTES BEFORE THE FIRST MEAL OF THE DAY 03/03/13   Sable Feil, MD  tizanidine (ZANAFLEX) 2 MG capsule Take 1 capsule (2 mg total) by mouth 2 (two) times daily. 11/10/13   [name], NP  vitamin B-12 (CYANOCOBALAMIN) 1000 MCG tablet Take 1,000 mcg by mouth daily.      Historical Provider, MD    ALLERGIES:  Allergies  Allergen Reactions  . Choline Fenofibrate Other (See Comments)    REACTION: pt states INTOL to Trilipix w/ "thigh burning"  . Simvastatin Other (See Comments)    REACTION: pt states INTOL to STATINS \\T \ refuses to restart  . Hydrocodone     Nightmare after taking cough syrup w/hydrocodone  . Adhesive [Tape] Rash  . Ceclor [Cefaclor] Rash  . Clarithromycin Rash  . Codeine Nausea Only  . Doxycycline Rash  . Lisinopril Cough    Developed ACE cough...  . Penicillins Itching and Rash    At injection site  . Tobramycin-Dexamethasone Rash    SOCIAL HISTORY:  History  Substance Use Topics  . Smoking status: Never Smoker   . Smokeless tobacco: Never Used  . Alcohol Use: No    FAMILY HISTORY: Family History  Problem Relation Age of Onset  . Heart disease Brother   . Cancer Brother     gland cancer  . Diabetes Sister   . Breast cancer Sister   . Stroke Mother   . Stroke Father   . Colon cancer Neg Hx   . Stomach cancer Neg Hx   . Pancreatic cancer Neg Hx   . Prostate cancer Father   . Kidney disease Neg Hx   .  Liver disease Neg Hx     EXAM: BP 176/113  Pulse 68  Temp(Src) 97.8 F (36.6 C) (Oral)  Resp 16  SpO2 95%  LMP 11/26/1990 CONSTITUTIONAL: Alert and oriented and responds appropriately to questions. Well-appearing; well-nourished patient appears uncomfortable, is hyperventilating HEAD: Normocephalic EYES: Conjunctivae clear, PERRL ENT: normal nose; no rhinorrhea; moist mucous membranes; pharynx without lesions noted NECK: Supple, no meningismus, no LAD  CARD: RRR; S1 and S2 appreciated; no murmurs, no clicks, no rubs, no gallops RESP: Normal chest excursion without splinting  patient is hyperventilating, breath sounds clear and equal bilaterally, no rhonchi or wheezing or rales ABD/GI: Normal bowel sounds; non-distended; soft, non-tender, no rebound, no guarding BACK:  The back appears normal and is non-tender to palpation, there is no CVA tenderness EXT: Normal ROM in all joints; non-tender to palpation; no edema; normal capillary refill; no cyanosis    SKIN: Normal color for age and race; warm NEURO: Moves all extremities equally; sensation to light touch intact diffusely, cranial nerves II through XII intact PSYCH: The patient's mood and manner are appropriate. Grooming and personal hygiene are appropriate.  MEDICAL DECISION MAKING: Patient here with complaints of headache that she has had in the past. She has no meningismus on exam. She is neurologically intact. No history of head injury. She is not on anticoagulation.  Given her age however and comorbidities and the fact that she appears uncomfortable, will obtain a CT of her head. We'll give fentanyl, Zofran. She is also complaining of burning chest pain which is atypical. Will obtain cardiac enzymes, chest x-ray. Her EKG does show very mild ST elevation in aVR and ST depression in the inferior leads but this appears similar compared to her prior EKGs. We'll continue to closely monitor.  ED PROGRESS: Patient's labs show a sodium of  119. This was drawn straight out of her vein and not all the line with IV fluids. Chest x-ray is clear. She states she is not on diuretics but appears poor her med list that she is on hydrochlorothiazide which is likely the cause of her hyponatremia. She may be dehydrated. Will obtain urinalysis. She has received 500 mL of normal saline. Will continue IV fluids at 100 mL per hour. Will discuss with hospitalist for admission. Chest x-ray clear. Head CT shows no intracranial hemorrhage or infarct. Headache is improving with fentanyl. Will give second dose.   7:28 PM  She does appear dry on exam, urinalysis shows ketones. Will continue IV hydration. Discussed with Dr. Ernestina Patches with hospitalist service for admission to inpatient, stepdown. Patient agrees with transfer to [facility].    EKG Interpretation  Date/Time:  Thursday December 23 2013 17:57:53 EDT Ventricular Rate:  68 PR Interval:  176 QRS Duration: 80 QT Interval:  450 QTC Calculation: 478 R Axis:   45 Text Interpretation:  Normal sinus rhythm ST \\T \ T wave abnormality, consider inferior ischemia Prolonged QT Abnormal ECG No significant change since last tracing Confirmed by Averiana Clouatre,  DO, Tykwon Fera ([id number]) on 12/23/2013 5:58:33 PM         CRITICAL CARE Performed by: Nyra Jabs   Total critical care time: 40 minutes  Critical care time was exclusive of separately billable procedures and treating other patients.  Critical care was necessary to treat or prevent imminent or life-threatening deterioration.  Critical care was time spent personally by me on the following activities: development of treatment plan with patient and/or surrogate as well as nursing, discussions with consultants, evaluation of patient's response to treatment, examination of patient, obtaining history from patient or surrogate, ordering and performing treatments and interventions, ordering and review of laboratory studies, ordering and review of  radiographic studies, pulse oximetry and re-evaluation of patient's condition.   [place], DO 12/23/13 1928

## 2013-12-23 NOTE — H&P (Signed)
[facility] History and Physical  [name] ZDG:[id number] DOB: 1935-08-17 DOA: 12/23/2013  Referring physician: EDP PCP: Noralee Space, MD   Chief Complaint: Headache   HPI: [name] is a 78 y.o. female h/o HTN, HLD, chronic headaches, who presents to the ED with c/o headache.  She has had nonproductive cough for 2 days.  Seen by Dr. Lenna Gilford 2 days ago, started on levaquin, and told to take mucinex for COPD exacerbation.  Patient presents to ED now with chest pain and headache.  Nothing makes symptoms better or worse.  BPs in the ED were reportedly running around the 595G systolic.  She was found to be hyponatremic to 119.  Transferred to [facility] for hyponatremia.  At arrival at [facility] her SBP was noted to be running consistently in the 220s and she states her headache is even worse!  She cant remember if it was "Mucinex or Mucinex-D or Mucinex-Cold" that she has been taking for her cold.  Review of Systems: Systems reviewed.  As above, otherwise negative  Past Medical History  Diagnosis Date  . Unspecified glaucoma   . Allergic rhinitis, cause unspecified   . Unspecified asthma(493.90)   . Unspecified essential hypertension   . Other and unspecified hyperlipidemia   . Gastritis   . Abdominal pain, left lower quadrant   . Diverticulosis of colon (without mention of hemorrhage)   . Irritable bowel syndrome   . Colonic polyp 04-27-2009    tubular adenoma  . Headache(784.0)   . Dizziness   . Breast cancer, Left 12/20/2010  . Gallstones   . CAD (coronary artery disease)     a. 01/19/2010 s/p CABG x 3, lima->lad, vg->diag, vg->om1;  b. 06/2011 :Lexi MV: EF 84%, No ischemia.  . Bronchitis     hx of  . Pneumonia   . Hiatal hernia   . Fibromyalgia   . Esophageal reflux     hiatal hernia  . Atrophic vaginitis   . GERD (gastroesophageal reflux disease)   . Hypercholesterolemia   . Breast cancer   . Glaucoma   . Cervical arthritis    Past Surgical History   Procedure Laterality Date  . Coronary angioplasty with stent placement      Stent 2007  . Open heart surgery      01/19/2010  . Coronary artery bypass graft    . Cataract extraction, bilateral      bilateral caaract removal,  . Breast lumpectomy Left 02/06/11   Social History:  reports that she has never smoked. She has never used smokeless tobacco. She reports that she does not drink alcohol or use illicit drugs.  Allergies  Allergen Reactions  . Choline Fenofibrate Other (See Comments)    REACTION: pt states INTOL to Trilipix w/ "thigh burning"  . Simvastatin Other (See Comments)    REACTION: pt states INTOL to STATINS \\T \ refuses to restart  . Hydrocodone     Nightmare after taking cough syrup w/hydrocodone  . Adhesive [Tape] Rash  . Ceclor [Cefaclor] Rash  . Clarithromycin Rash  . Codeine Nausea Only  . Doxycycline Rash  . Lisinopril Cough    Developed ACE cough...  . Penicillins Itching and Rash    At injection site  . Tobramycin-Dexamethasone Rash    Family History  Problem Relation Age of Onset  . Heart disease Brother   . Cancer Brother     gland cancer  . Diabetes Sister   . Breast cancer Sister   . Stroke  Mother   . Stroke Father   . Colon cancer Neg Hx   . Stomach cancer Neg Hx   . Pancreatic cancer Neg Hx   . Prostate cancer Father   . Kidney disease Neg Hx   . Liver disease Neg Hx      Prior to Admission medications   Medication Sig Start Date End Date Taking? Authorizing Provider  aspirin 81 MG tablet Take 81 mg by mouth daily.     Yes Historical Provider, MD  Diphenhyd-Hydrocort-Nystatin (FIRST-DUKES MOUTHWASH) SUSP 1 tsp gargle and swallow four times daily as needed for your throat 12/21/13  Yes Noralee Space, MD  fexofenadine (ALLEGRA) 180 MG tablet Take 180 mg by mouth daily.     Yes Historical Provider, MD  Glucosamine-Chondroit-Vit C-Mn (GLUCOSAMINE CHONDR 1500 COMPLX PO) Take 1 tablet by mouth 3 (three) times daily.     Yes Historical  Provider, MD  isosorbide mononitrate (IMDUR) 30 MG 24 hr tablet Take 1 tablet (30 mg total) by mouth daily. 12/13/13  Yes Josue Hector, MD  ketoconazole (NIZORAL) 2 % shampoo  04/29/13  Yes Historical Provider, MD  latanoprost (XALATAN) 0.005 % ophthalmic solution Place 1 drop into both eyes at bedtime.  07/31/12  Yes Historical Provider, MD  letrozole (FEMARA) 2.5 MG tablet Take 1 tablet (2.5 mg total) by mouth daily. 07/27/13  Yes Amy Milda Smart, PA-C  levofloxacin (LEVAQUIN) 500 MG tablet Take 1 tablet (500 mg total) by mouth daily. 12/21/13  Yes Noralee Space, MD  losartan-hydrochlorothiazide (HYZAAR) 100-12.5 MG per tablet Take 1 tablet by mouth daily. 05/17/13  Yes Josue Hector, MD  metoprolol (LOPRESSOR) 50 MG tablet Take 1 tablet (50 mg total) by mouth 2 (two) times daily. 09/27/13  Yes Josue Hector, MD  mometasone (NASONEX) 50 MCG/ACT nasal spray 2 sprays by Nasal route daily.     Yes Historical Provider, MD  nitroGLYCERIN (NITROSTAT) 0.4 MG SL tablet Take one tablet  By mouth SL every 5 minutes for a total of 3 doses as needed for chest pain. 07/15/12  Yes Josue Hector, MD  omeprazole (PRILOSEC) 40 MG capsule take 1 capsule by mouth once daily 30 MINUTES BEFORE THE FIRST MEAL OF THE DAY 03/03/13  Yes Sable Feil, MD  tizanidine (ZANAFLEX) 2 MG capsule Take 1 capsule (2 mg total) by mouth 2 (two) times daily. 11/10/13  Yes [name], NP  vitamin B-12 (CYANOCOBALAMIN) 1000 MCG tablet Take 1,000 mcg by mouth daily.     Yes Historical Provider, MD  fluocinonide (LIDEX) 0.05 % external solution  04/29/13   Historical Provider, MD  hyoscyamine (ANASPAZ) 0.125 MG TBDP tablet PLACE 1 TABLET UNDER THE TONGUE EVERY 6 HOURS AS NEEDED. 10/31/12   Amy S Esterwood, PA-C  Omega-3 Fatty Acids (FISH OIL) 1200 MG CAPS 2 tabs po bid     Historical Provider, MD   Physical Exam: Filed Vitals:   12/23/13 2325  BP: 218/91  Pulse: 82  Temp:   Resp:     BP 218/91  Pulse 82  Temp(Src) 98.1 F (36.7  C) (Oral)  Resp 15  Ht 4\' 10"  (1.473 m)  Wt 61.9 kg (136 lb 7.4 oz)  BMI 28.53 kg/m2  SpO2 98%  LMP 11/26/1990  General Appearance:    Alert, oriented, no distress, appears stated age  Head:    Normocephalic, atraumatic  Eyes:    PERRL, EOMI, sclera non-icteric        Nose:  Nares without drainage or epistaxis. Mucosa, turbinates normal  Throat:   Moist mucous membranes. Oropharynx without erythema or exudate.  Neck:   Supple. No carotid bruits.  No thyromegaly.  No lymphadenopathy.   Back:     No CVA tenderness, no spinal tenderness  Lungs:     Clear to auscultation bilaterally, without wheezes, rhonchi or rales  Chest wall:    No tenderness to palpitation  Heart:    Regular rate and rhythm without murmurs, gallops, rubs  Abdomen:     Soft, non-tender, nondistended, normal bowel sounds, no organomegaly  Genitalia:    deferred  Rectal:    deferred  Extremities:   No clubbing, cyanosis or edema.  Pulses:   2+ and symmetric all extremities  Skin:   Skin color, texture, turgor normal, no rashes or lesions  Lymph nodes:   Cervical, supraclavicular, and axillary nodes normal  Neurologic:   CNII-XII intact. Normal strength, sensation and reflexes      throughout    Labs on Admission:  Basic Metabolic Panel:  Recent Labs Lab 12/23/13 1744  NA 119*  K 3.1*  CL 75*  CO2 22  GLUCOSE 129*  BUN 13  CREATININE 0.50  CALCIUM 9.8   Liver Function Tests: No results found for this basename: AST, ALT, ALKPHOS, BILITOT, PROT, ALBUMIN,  in the last 168 hours No results found for this basename: LIPASE, AMYLASE,  in the last 168 hours No results found for this basename: AMMONIA,  in the last 168 hours CBC:  Recent Labs Lab 12/23/13 1744  WBC 10.4  NEUTROABS 5.5  HGB 13.0  HCT 36.2  MCV 84.8  PLT 296   Cardiac Enzymes:  Recent Labs Lab 12/23/13 1744  TROPONINI <0.30    BNP (last 3 results) No results found for this basename: PROBNP,  in the last 8760 hours CBG: No  results found for this basename: GLUCAP,  in the last 168 hours  Radiological Exams on Admission: Dg Chest 2 View  12/23/2013   CLINICAL DATA:  Cough and dizziness  EXAM: CHEST  2 VIEW  COMPARISON:  December 21, 2013  FINDINGS: There is underlying emphysematous change. There is no edema or consolidation. Heart is upper normal in size with pulmonary vascularity within normal limits. No adenopathy. There is degenerative change in the thoracic spine. Patient is status post coronary artery bypass grafting.  IMPRESSION: Underlying emphysematous change.  No edema or consolidation.   Electronically Signed   By: Lowella Grip M.D.   On: 12/23/2013 18:41   Ct Head Wo Contrast  12/23/2013   CLINICAL DATA:  Cough, chest pain, headache and dizziness  EXAM: CT HEAD WITHOUT CONTRAST  TECHNIQUE: Contiguous axial images were obtained from the base of the skull through the vertex without intravenous contrast.  COMPARISON:  CT 11/02/2012  FINDINGS: No acute intracranial hemorrhage. No focal mass lesion. No CT evidence of acute infarction. No midline shift or mass effect. No hydrocephalus. Basilar cisterns are patent.  There is periventricular and white matter hypodensities not changed from prior. Generalized cortical atrophy unchanged.  Paranasal sinuses and  mastoid air cells are clear.  IMPRESSION: 1. No acute intracranial findings. 2. Atrophy and white matter microvascular disease are not changed from prior.   Electronically Signed   By: Suzy Bouchard M.D.   On: 12/23/2013 18:34    EKG: Independently reviewed.  Assessment/Plan Active Problems:   Hyponatremia   Hypertensive emergency   Acute encephalopathy   Headache   1. Headache and  acute encephalopathy - mild difficulty with memory / confusion, and severe headache due to either hyponatremia or hypertensive emergency or both.  CT head w/o contrast was negative for acute findings (no mass, no brain bleed, etc).  Treating both hyponatremia and HTN as  below. 2. Hyponatremia - possibly due to HCTZ vs less likely new onset SIADH.  Getting NS IV at 100 cc/hr, repeating BMP now and again at 0500 to ensure sodium rise is appropriate.  If sodium decreases because she has as of yet undiagnosed SIADH then will need to consult PCCM for hypertonic saline.  Urine sodium also ordered to help eval for SIADH.  Holding HCTZ 3. Hypertensive urgency - patient getting PRN labetalol here in ICU.  Unclear cause, question if patient's OTC mucinex may have had phenylephrine in it.  First dose of 10mg  IV had minimal effect (patient was still in 210s SBP) so a second dose of 10mg  IV was attempted, the second dose surprisingly had a MAJOR effect, SBP is now down MORE than I had wanted, and is 167 15 mins after 2nd dose and now 195/91.  Patients SBP typically runs in the 130s at home.  Goal SBP is ~ 170s to 180s, if labetalol is not effective then will need to put patient on either labetalol gtt or Cardene gtt (want to avoid NTG gtt if possible due to fact she already has severe headache).  If patient needs gtt for BP control, will likely need PCCM on board.  No focal neurologic deficits to suggest acute ischemic stroke at this time.    Code Status: Full Code  Family Communication: Husband at bedside Disposition Plan: Admit to inpatient   Time spent: 70 min  GARDNER, JARED M. [facility] Pager [phone]  If 7AM-7PM, please contact the day team taking care of the patient [email or web address] Password [email or web address] 12/23/2013, 11:28 PM

## 2013-12-24 ENCOUNTER — Inpatient Hospital Stay (HOSPITAL_COMMUNITY): Payer: Medicare Other

## 2013-12-24 DIAGNOSIS — M4302 Spondylolysis, cervical region: Secondary | ICD-10-CM

## 2013-12-24 DIAGNOSIS — M5136 Other intervertebral disc degeneration, lumbar region: Secondary | ICD-10-CM

## 2013-12-24 DIAGNOSIS — I25701 Atherosclerosis of coronary artery bypass graft(s), unspecified, with angina pectoris with documented spasm: Secondary | ICD-10-CM | POA: Diagnosis present

## 2013-12-24 DIAGNOSIS — I2510 Atherosclerotic heart disease of native coronary artery without angina pectoris: Secondary | ICD-10-CM

## 2013-12-24 DIAGNOSIS — M4316 Spondylolisthesis, lumbar region: Secondary | ICD-10-CM

## 2013-12-24 LAB — BASIC METABOLIC PANEL
ANION GAP: 20 — AB (ref 5–15)
ANION GAP: 16 — AB (ref 5–15)
Anion gap: 18 — ABNORMAL HIGH (ref 5–15)
Anion gap: 17 — ABNORMAL HIGH (ref 5–15)
BUN: 9 mg/dL (ref 6–23)
BUN: 9 mg/dL (ref 6–23)
BUN: 9 mg/dL (ref 6–23)
BUN: 11 mg/dL (ref 6–23)
CALCIUM: 9.4 mg/dL (ref 8.4–10.5)
CHLORIDE: 78 meq/L — AB (ref 96–112)
CO2: 20 meq/L (ref 19–32)
CO2: 22 meq/L (ref 19–32)
CO2: 21 mEq/L (ref 19–32)
CO2: 23 meq/L (ref 19–32)
CREATININE: 0.45 mg/dL — AB (ref 0.50–1.10)
Calcium: 8.8 mg/dL (ref 8.4–10.5)
Calcium: 9 mg/dL (ref 8.4–10.5)
Calcium: 9.4 mg/dL (ref 8.4–10.5)
Chloride: 79 mEq/L — ABNORMAL LOW (ref 96–112)
Chloride: 77 mEq/L — ABNORMAL LOW (ref 96–112)
Chloride: 80 mEq/L — ABNORMAL LOW (ref 96–112)
Creatinine, Ser: 0.42 mg/dL — ABNORMAL LOW (ref 0.50–1.10)
Creatinine, Ser: 0.45 mg/dL — ABNORMAL LOW (ref 0.50–1.10)
Creatinine, Ser: 0.49 mg/dL — ABNORMAL LOW (ref 0.50–1.10)
GFR calc Af Amer: >90 mL/min (ref >90)
GFR calc Af Amer: >90 mL/min (ref >90)
GFR calc Af Amer: >90 mL/min (ref >90)
GFR calc Af Amer: >90 mL/min (ref >90)
GFR calc non Af Amer: >90 mL/min (ref >90)
GFR calc non Af Amer: >90 mL/min (ref >90)
GLUCOSE: 131 mg/dL — AB (ref 70–99)
Glucose, Bld: 137 mg/dL — ABNORMAL HIGH (ref 70–99)
Glucose, Bld: 114 mg/dL — ABNORMAL HIGH (ref 70–99)
Glucose, Bld: 119 mg/dL — ABNORMAL HIGH (ref 70–99)
POTASSIUM: 3.7 meq/L (ref 3.7–5.3)
POTASSIUM: 3.8 meq/L (ref 3.7–5.3)
Potassium: 3.9 mEq/L (ref 3.7–5.3)
Potassium: 4.5 mEq/L (ref 3.7–5.3)
SODIUM: 116 meq/L — AB (ref 137–147)
SODIUM: 118 meq/L — AB (ref 137–147)
Sodium: 118 mEq/L — CL (ref 137–147)
Sodium: 119 mEq/L — CL (ref 137–147)

## 2013-12-24 LAB — CBC
HCT: 35.7 % — ABNORMAL LOW (ref 36.0–46.0)
HEMOGLOBIN: 12.5 g/dL (ref 12.0–15.0)
MCH: 29.1 pg (ref 26.0–34.0)
MCHC: 35 g/dL (ref 30.0–36.0)
MCV: 83.2 fL (ref 78.0–100.0)
Platelets: 266 10*3/uL (ref 150–400)
RBC: 4.29 MIL/uL (ref 3.87–5.11)
RDW: 13.2 % (ref 11.5–15.5)
WBC: 8.5 10*3/uL (ref 4.0–10.5)

## 2013-12-24 LAB — SODIUM, URINE, RANDOM
SODIUM UR: 143 meq/L
Sodium, Ur: 78 mEq/L

## 2013-12-24 LAB — LIPASE, BLOOD: Lipase: 11 U/L (ref 11–59)

## 2013-12-24 LAB — CHLORIDE, URINE, RANDOM: CHLORIDE URINE: 51 meq/L

## 2013-12-24 LAB — OSMOLALITY, URINE: Osmolality, Ur: 385 mOsm/kg — ABNORMAL LOW (ref 390–1090)

## 2013-12-24 LAB — OSMOLALITY: OSMOLALITY: 244 mosm/kg — AB (ref 275–300)

## 2013-12-24 LAB — CREATININE, URINE, RANDOM: Creatinine, Urine: 82.02 mg/dL

## 2013-12-24 LAB — TSH: TSH: 1.6 u[IU]/mL (ref 0.350–4.500)

## 2013-12-24 MED ORDER — SODIUM CHLORIDE 0.9 % IV SOLN: INTRAVENOUS | Status: DC

## 2013-12-24 MED ORDER — LEVOFLOXACIN IN D5W 500 MG/100ML IV SOLN
500.0000 mg | INTRAVENOUS | Status: DC
  Administered 2013-12-24: 500 mg via INTRAVENOUS
  Filled 2013-12-24 (×2): qty 100

## 2013-12-24 MED ORDER — PROMETHAZINE HCL 25 MG/ML IJ SOLN
12.5000 mg | INTRAMUSCULAR | Status: DC | PRN
  Administered 2013-12-24: 12.5 mg via INTRAVENOUS
  Filled 2013-12-24: qty 1

## 2013-12-24 MED ORDER — ONDANSETRON HCL 4 MG/2ML IJ SOLN: 4.0000 mg | Freq: Four times a day (QID) | INTRAMUSCULAR | Status: DC | PRN

## 2013-12-24 MED ORDER — ONDANSETRON HCL 4 MG/2ML IJ SOLN
4.0000 mg | Freq: Four times a day (QID) | INTRAMUSCULAR | Status: DC | PRN
  Administered 2013-12-24 – 2013-12-25 (×4): 4 mg via INTRAVENOUS
  Filled 2013-12-24 (×4): qty 2

## 2013-12-24 MED ORDER — ACETAMINOPHEN 325 MG PO TABS: 650.0000 mg | ORAL_TABLET | Freq: Four times a day (QID) | ORAL | Status: DC | PRN

## 2013-12-24 MED ORDER — METOPROLOL TARTRATE 1 MG/ML IV SOLN
5.0000 mg | Freq: Four times a day (QID) | INTRAVENOUS | Status: DC
  Administered 2013-12-24 – 2013-12-25 (×2): 5 mg via INTRAVENOUS
  Filled 2013-12-24 (×6): qty 5

## 2013-12-24 MED ORDER — PANTOPRAZOLE SODIUM 40 MG IV SOLR
40.0000 mg | Freq: Two times a day (BID) | INTRAVENOUS | Status: DC
  Administered 2013-12-24 – 2013-12-25 (×3): 40 mg via INTRAVENOUS
  Filled 2013-12-24 (×4): qty 40

## 2013-12-24 MED ORDER — MORPHINE SULFATE 2 MG/ML IJ SOLN
1.0000 mg | INTRAMUSCULAR | Status: DC | PRN
  Administered 2013-12-24 (×3): 2 mg via INTRAVENOUS
  Filled 2013-12-24 (×5): qty 1

## 2013-12-24 MED ORDER — NICARDIPINE HCL IN NACL 20-0.86 MG/200ML-% IV SOLN
3.0000 mg/h | INTRAVENOUS | Status: DC
  Administered 2013-12-24: 3 mg/h via INTRAVENOUS
  Filled 2013-12-24 (×3): qty 200

## 2013-12-24 MED ORDER — LORAZEPAM 2 MG/ML IJ SOLN: 0.5000 mg | Freq: Once | INTRAMUSCULAR | Status: DC

## 2013-12-24 MED ORDER — LEVOFLOXACIN 500 MG PO TABS
500.0000 mg | ORAL_TABLET | Freq: Every day | ORAL | Status: DC
  Filled 2013-12-24: qty 1

## 2013-12-24 NOTE — Significant Event (Addendum)
Repeat BMP at midnight showed Na now down to 116 despite 1L of NS so far.  Presumptive diagnosis of SIADH, urine sodium is sent and pending.  I spoke with Dr. Chase Caller of PCCM who recommended just fluid restricting the patient, holding the HCTZ, and seeing where her sodium was at 0200.  Will order repeat BMP for 0200, IVF now off, and fluid restricting to 813ml PO intake daily.

## 2013-12-24 NOTE — Progress Notes (Signed)
[facility]  CESILY CUOCO FVC:[id number] DOB: 1935-05-13 DOA: 12/23/2013 PCP: Noralee Space, MD  Brief narrative: 78 y.o. female w/ h/o HTN, HLD, and chronic headaches, who presented to the ED with c/o headache. She had nonproductive cough for 2 days. Seen by Dr. Lenna Gilford on 10/27 and was started on Levaquin and Mucinex for ABR. She presented to the [facility] and BPs were reportedly running around the 638G systolic. She was found to be hyponatremic to 119. Transferred to [facility] for hyponatremia.   At arrival at [place] her SBP was noted to be running consistently in the 220s and she stated her headache had worsened. She was neurologically intact. It was felt she was hypovolemic so NS IVFs were initiated.  HPI/Subjective: Endorsing occipital HA - mild bilateral visual changes (blurred vision) but o/w neurologically intact. Mild nausea, no emesis. No CP or SOB.  Assessment/Plan:  Hypertensive emergency BP poorly controlled - given HA will dc Imdur but cont Cozaar and Lopressor - begin Cardene gtt - lungs clear so no evidence of flash pulmonary edema  Acute Hyponatremia Etiology unclear - chloride was in the upper 70s - low Na+ initially felt to be 2/2 DH and preadmit HCTZ but Na went down after NS fluids so admit MD dc'd fluids and placed on FR - urine sp gravity was 1.009 - urine sodium markedly elevated at 143 (c/w preadmit HCTZ +/- SIADH) - now that has received NS fluids will ck serum and urine osmo and repeat urine Na with a urine Cl to see if can clarify - preadmit meds reviewed and no other offending meds found - after labs drawn will dose w/ lasix IV to attempt to affect free water diuresis   Occipital Headache  Has known cervical spondylosis but suspect HA from elevated BP- IV Morphine prn - Zofran for assoc nausea - may have 1x dose Ativan if needed    Acute encephalopathy From severe hyponatremia and HA pain - neurologically intact and oriented this  am - follow - initial CT Head unremarkable for acute changes  Bronchitis, chronic obstructive w acute bronchitis No present indication for continuing abx - currently not coughing and no sinusitis seen on CT  Mixed hyperlipidemia Apparent h/o myalgia so not on statin but was on fish oil at home  DIZZINESS, CHRONIC   Coronary artery disease Currently no sx's   DVT prophylaxis: SQ Heparin Code Status: Full Family Communication: Pt and husband at bedside Disposition Plan/Expected LOS: Stepdown  Consultants: None  Procedures: None  Cultures: None  Antibiotics: none  Objective: Blood pressure 169/67, pulse 73, temperature 97.4 F (36.3 C), temperature source Oral, resp. rate 12, height 4\' 10"  (1.473 m), weight 134 lb 4.2 oz (60.9 kg), last menstrual period 11/26/1990, SpO2 98.00%.  Intake/Output Summary (Last 24 hours) at 12/24/13 0826 Last data filed at 12/24/13 0700  Gross per 24 hour  Intake      0 ml  Output   1160 ml  Net  -1160 ml    Exam: Gen: No acute respiratory distress - sleepy but awakens easily and neurologically intact Chest: Clear to auscultation bilaterally without wheezes, rhonchi or crackles, room air Cardiac: Regular rate and rhythm, no rubs murmurs or gallops Abdomen: Soft nontender nondistended without obvious hepatosplenomegaly, no ascites Extremities: no signif cyanosis, clubbing, or edema B LE   Scheduled Meds:  Scheduled Meds: . aspirin EC  81 mg Oral Daily  . fluticasone  2 spray Each Nare  Daily  . heparin  5,000 Units Subcutaneous 3 times per day  . latanoprost  1 drop Both Eyes QHS  . letrozole  2.5 mg Oral Daily  . levofloxacin  500 mg Oral Daily  . loratadine  10 mg Oral Daily  . LORazepam  0.5 mg Intravenous Once  . losartan  100 mg Oral Daily  . metoprolol  50 mg Oral BID  . pantoprazole  80 mg Oral Daily  . potassium chloride  40 mEq Oral BID  . tiZANidine  2 mg Oral BID   Continuous Infusions: . niCARDipine      Data  Reviewed: Basic Metabolic Panel:  Recent Labs Lab 12/23/13 1744 12/23/13 2316 12/24/13 0230  NA 119* 116* 118*  K 3.1* 3.7 3.8  CL 75* 78* 79*  CO2 22 20 22   GLUCOSE 129* 137* 131*  BUN 13 9 9   CREATININE 0.50 0.42* 0.45*  CALCIUM 9.8 8.8 9.0   Liver Function Tests: No results found for this basename: AST, ALT, ALKPHOS, BILITOT, PROT, ALBUMIN,  in the last 168 hours  CBC:  Recent Labs Lab 12/23/13 1744 12/24/13 0230  WBC 10.4 8.5  NEUTROABS 5.5  --   HGB 13.0 12.5  HCT 36.2 35.7*  MCV 84.8 83.2  PLT 296 266   Cardiac Enzymes:  Recent Labs Lab 12/23/13 1744  TROPONINI <0.30    Recent Results (from the past 240 hour(s))  MRSA PCR SCREENING     Status: None   Collection Time    12/23/13 10:19 PM      Result Value Ref Range Status   MRSA by PCR NEGATIVE  NEGATIVE Final   Comment:            The GeneXpert MRSA Assay (FDA     approved for NASAL specimens     only), is one component of a     comprehensive MRSA colonization     surveillance program. It is not     intended to diagnose MRSA     infection nor to guide or     monitor treatment for     MRSA infections.     Studies:  Recent x-ray studies have been reviewed in detail by the Attending Physician  Time spent :  [place], ANP [facility] Office  [phone] Pager [phone]   **If unable to reach the above provider after paging please contact the [place] @ [phone]  On-Call/Text Page:      [email or web address]      password [email or web address]  If 7PM-7AM, please contact night-coverage [email or web address] Password [email or web address] 12/24/2013, 8:26 AM   LOS: 1 day   I have personally examined this patient and reviewed the entire database. I have reviewed the above note, made any necessary editorial changes, and agree with its content.  Cherene Altes, MD [facility]

## 2013-12-24 NOTE — Progress Notes (Signed)
Patient arrived via stretcher by EMS from [facility] ED. 0.9 NS infusing at 155ml via pump into PIV in Right AC. Patients vital signs showing Hypertensive 225/108. Dr. Jennette Kettle came to see patient. Received new orders and new lab work sent to lab. MD ordered Labetalol 10mg  stat. 1st dose given. Patient not responding b/p's sustaining 180's and up. Another dose of Labetalol 10mg  ordered and given. Slow response to this dose as well. Patient stated she is freezing cold, wrapped patient in heated blankets and lots of covers for warmth. Serial b/p's set for q15 minutes see flowsheet. Patient trying to relax and calm down. Will continue to monitor this patient closely.

## 2013-12-24 NOTE — Progress Notes (Signed)
Stopped 0.9NS gtt as ordered. Dr. Alcario Drought spoke with Critical care MD and stopped all fluids and placed patient on 858ml Fluid restriction. Repeat NA down to 116. Monitoring closely.

## 2013-12-24 NOTE — Progress Notes (Signed)
SBP in the 80s, Paged MD, Cardizem Drip stopped, orders for NS bolus 250cc.

## 2013-12-24 NOTE — Progress Notes (Signed)
CRITICAL VALUE ALERT  Critical value received:  Sodium 118  Date of notification:  12/24/2013  Time of notification:  0420  Critical value read back:Yes.    Nurse who received alert:  Tyrell Antonio  MD notified (1st page):  Hoyt Koch  Time of first page:  0425  MD notified (2nd page):  Time of second page:  Responding MD:  Hoyt Koch  Time MD responded: [phone]

## 2013-12-24 NOTE — Progress Notes (Signed)
CRITICAL VALUE ALERT  Critical value received:  Na 116  Date of notification:  12/24/13  Time of notification:  0020  Critical value read back:Yes.    Nurse who received alert:  Melene Plan RN  MD notified (1st page):  Jennette Kettle DO   Reported to MD at bedside

## 2013-12-24 NOTE — Progress Notes (Signed)
Paged NP about 119 NA level.  All critical lab values were given to MD earlier as the day went on.

## 2013-12-24 NOTE — Progress Notes (Signed)
Pt SBP in the 200s, patient is very nauseated, c/o of severe HA 7/10, and her neuro status is questionable, patient's eyes are gazed over, she is slow to respond but can respond to stimuli, spoke with NP, orders received:  Morphine for pain Cardene drip for BP control NPO except pills Neuro checks Q2H PRN Ativan if other interventions do not help  Will monitor

## 2013-12-24 NOTE — Progress Notes (Signed)
UR completed. Jandel Patriarca RN CCM Case Mgmt 

## 2013-12-24 NOTE — Progress Notes (Signed)
PO BP meds and PO Potassium were given, 5 minutes later, patient vomited (~200c), patient states she feels better after vomiting, will hold all other morning meds until N/V is better controlled.  PRN zofran was given earlier. Will monitor

## 2013-12-25 LAB — BASIC METABOLIC PANEL
ANION GAP: 16 — AB (ref 5–15)
BUN: 13 mg/dL (ref 6–23)
CALCIUM: 9.5 mg/dL (ref 8.4–10.5)
CO2: 23 meq/L (ref 19–32)
CREATININE: 0.65 mg/dL (ref 0.50–1.10)
Chloride: 83 mEq/L — ABNORMAL LOW (ref 96–112)
GFR calc Af Amer: >90 mL/min (ref >90)
GFR calc non Af Amer: 84 mL/min — ABNORMAL LOW (ref >90)
Glucose, Bld: 91 mg/dL (ref 70–99)
Potassium: 3.9 mEq/L (ref 3.7–5.3)
Sodium: 122 mEq/L — ABNORMAL LOW (ref 137–147)

## 2013-12-25 MED ORDER — HYOSCYAMINE SULFATE 0.125 MG PO TBDP
0.1250 mg | ORAL_TABLET | Freq: Four times a day (QID) | ORAL | Status: DC | PRN
  Administered 2013-12-25: 0.125 mg via SUBLINGUAL
  Filled 2013-12-25: qty 1

## 2013-12-25 MED ORDER — METOPROLOL TARTRATE 50 MG PO TABS
50.0000 mg | ORAL_TABLET | Freq: Two times a day (BID) | ORAL | Status: DC
  Administered 2013-12-25 – 2013-12-27 (×5): 50 mg via ORAL
  Filled 2013-12-25 (×6): qty 1

## 2013-12-25 MED ORDER — ISOSORBIDE MONONITRATE ER 30 MG PO TB24
30.0000 mg | ORAL_TABLET | Freq: Every day | ORAL | Status: DC
  Administered 2013-12-25 – 2013-12-27 (×3): 30 mg via ORAL
  Filled 2013-12-25 (×3): qty 1

## 2013-12-25 MED ORDER — ACETAMINOPHEN 325 MG PO TABS
650.0000 mg | ORAL_TABLET | Freq: Four times a day (QID) | ORAL | Status: DC | PRN
  Administered 2013-12-25 – 2013-12-27 (×4): 650 mg via ORAL
  Filled 2013-12-25 (×4): qty 2

## 2013-12-25 MED ORDER — ASPIRIN EC 81 MG PO TBEC
81.0000 mg | DELAYED_RELEASE_TABLET | Freq: Every day | ORAL | Status: DC
  Administered 2013-12-25 – 2013-12-27 (×3): 81 mg via ORAL
  Filled 2013-12-25 (×3): qty 1

## 2013-12-25 MED ORDER — LOSARTAN POTASSIUM 50 MG PO TABS
100.0000 mg | ORAL_TABLET | Freq: Every day | ORAL | Status: DC
  Administered 2013-12-25 – 2013-12-26 (×2): 100 mg via ORAL
  Filled 2013-12-25 (×2): qty 2

## 2013-12-25 MED ORDER — SACCHAROMYCES BOULARDII 250 MG PO CAPS
250.0000 mg | ORAL_CAPSULE | Freq: Two times a day (BID) | ORAL | Status: DC
  Administered 2013-12-25 – 2013-12-27 (×5): 250 mg via ORAL
  Filled 2013-12-25 (×6): qty 1

## 2013-12-25 MED ORDER — PANTOPRAZOLE SODIUM 40 MG PO TBEC
40.0000 mg | DELAYED_RELEASE_TABLET | Freq: Every day | ORAL | Status: DC
  Administered 2013-12-26 – 2013-12-27 (×2): 40 mg via ORAL
  Filled 2013-12-25 (×2): qty 1

## 2013-12-25 NOTE — Progress Notes (Signed)
[facility]  [name] [id number]:[id number] DOB: 03/17/1935 DOA: 12/23/2013 PCP: Noralee Space, MD  Brief narrative: 78 y.o. female w/ h/o HTN, HLD, and chronic headaches, who presented to the ED with c/o headache. She had nonproductive cough for 2 days. Seen by Dr. Lenna Gilford on 10/27 and was started on Levaquin and Mucinex for ABR. She presented to the [facility] and BPs were reportedly running around the 163W systolic. She was found to be hyponatremic to 119. Transferred to [facility] for hyponatremia.   At arrival at [place] her SBP was noted to be running consistently in the 220s and she stated her headache had worsened. She was neurologically intact. It was felt she was hypovolemic so NS IVFs were initiated.  HPI/Subjective: Pt feels much better today.  HA has resolved.  She is alert and conversant and at her baseline mental status.  Denies cp, n/v, or sob.    Assessment/Plan:  Hypertensive emergency BP much better controlled at this time - off cardene gtt - attempting to avoid overcorrection - adjust oral meds and follow - avoid diuretic for now   Acute Hyponatremia Etiology unclear - low Na+ initially felt to be 2/2 DH and preadmit HCTZ but Na went down after NS fluids so dc'd fluids - urine sp gravity was 1.009 - urine sodium markedly elevated at 143 (c/w preadmit HCTZ +/- SIADH) - preadmit meds reviewed and no other offending meds found - Na is climbing w/ fluid restriction and d/c of HCTZ - cont to follow - avoid diuretics   Occipital Headache  Has known cervical spondylosis but suspect HA from elevated BP- now resolved w/ improved BP  Acute encephalopathy From severe hyponatremia and HA pain - CT Head unremarkable for acute changes - resolved w/ climbing Na and improved BP  Bronchitis, chronic obstructive w acute bronchitis No present indication for continuing abx - currently not coughing and no sinusitis seen on CT  Mixed hyperlipidemia Apparent h/o  myalgia so not on statin but was on fish oil at home  DIZZINESS, CHRONIC   Coronary artery disease Currently no sx's   DVT prophylaxis: SQ Heparin Code Status: Full Family Communication: spoke w/ daughter at bedside  Disposition Plan/Expected LOS: Stepdown  Consultants: None  Procedures: None  Cultures: None  Antibiotics: Levaquin 10/30  Objective: Blood pressure 163/52, pulse 81, temperature 98.1 F (36.7 C), temperature source Oral, resp. rate 18, height 4\' 10"  (1.473 m), weight 60.9 kg (134 lb 4.2 oz), last menstrual period 11/26/1990, SpO2 99.00%.  Intake/Output Summary (Last 24 hours) at 12/25/13 1213 Last data filed at 12/25/13 0900  Gross per 24 hour  Intake    100 ml  Output   1375 ml  Net  -1275 ml   Exam: Gen: No acute respiratory distress - alert and oriented  Chest: Clear to auscultation bilaterally without wheezes, rhonchi or crackles Cardiac: Regular rate and rhythm, no rubs murmurs or gallops Abdomen: Soft nontender nondistended without obvious hepatosplenomegaly, no ascites Extremities: no signif cyanosis, clubbing, or edema B LE   Scheduled Meds:  Scheduled Meds: . aspirin EC  81 mg Oral Daily  . bifidobacterium infantis  1 capsule Oral Daily  . fluticasone  2 spray Each Nare Daily  . heparin  5,000 Units Subcutaneous 3 times per day  . latanoprost  1 drop Both Eyes QHS  . losartan  100 mg Oral Daily  . metoprolol  50 mg Oral BID  . pantoprazole  40  mg Oral Q1200   Data Reviewed: Basic Metabolic Panel:  Recent Labs Lab 12/23/13 2316 12/24/13 0230 12/24/13 0905 12/24/13 1600 12/25/13 0315  NA 116* 118* 118* 119* 122*  K 3.7 3.8 3.9 4.5 3.9  CL 78* 79* 77* 80* 83*  CO2 20 22 21 23 23   GLUCOSE 137* 131* 114* 119* 91  BUN 9 9 9 11 13   CREATININE 0.42* 0.45* 0.45* 0.49* 0.65  CALCIUM 8.8 9.0 9.4 9.4 9.5   Liver Function Tests: No results found for this basename: AST, ALT, ALKPHOS, BILITOT, PROT, ALBUMIN,  in the last 168  hours  CBC:  Recent Labs Lab 12/23/13 1744 12/24/13 0230  WBC 10.4 8.5  NEUTROABS 5.5  --   HGB 13.0 12.5  HCT 36.2 35.7*  MCV 84.8 83.2  PLT 296 266   Cardiac Enzymes:  Recent Labs Lab 12/23/13 1744  TROPONINI <0.30    Recent Results (from the past 240 hour(s))  MRSA PCR SCREENING     Status: None   Collection Time    12/23/13 10:19 PM      Result Value Ref Range Status   MRSA by PCR NEGATIVE  NEGATIVE Final   Comment:            The GeneXpert MRSA Assay (FDA     approved for NASAL specimens     only), is one component of a     comprehensive MRSA colonization     surveillance program. It is not     intended to diagnose MRSA     infection nor to guide or     monitor treatment for     MRSA infections.     Studies:  Recent x-ray studies have been reviewed in detail by the Attending Physician  Time spent :  55 mins  Cherene Altes, MD [facility] For Consults/Admissions - Flow Manager - [phone] Office  [phone] Pager [phone]  On-Call/Text Page:      [email or web address]      password [facility]  12/25/2013, 12:13 PM   LOS: 2 days

## 2013-12-26 LAB — BASIC METABOLIC PANEL
Anion gap: 16 — ABNORMAL HIGH (ref 5–15)
BUN: 15 mg/dL (ref 6–23)
CO2: 22 mEq/L (ref 19–32)
Calcium: 9.5 mg/dL (ref 8.4–10.5)
Chloride: 85 mEq/L — ABNORMAL LOW (ref 96–112)
Creatinine, Ser: 0.64 mg/dL (ref 0.50–1.10)
GFR, EST NON AFRICAN AMERICAN: 85 mL/min — AB (ref >90)
GLUCOSE: 87 mg/dL (ref 70–99)
POTASSIUM: 4.6 meq/L (ref 3.7–5.3)
SODIUM: 123 meq/L — AB (ref 137–147)

## 2013-12-26 LAB — CORTISOL: Cortisol, Plasma: 6.9 ug/dL

## 2013-12-26 LAB — SODIUM, URINE, RANDOM

## 2013-12-26 LAB — URIC ACID: Uric Acid, Serum: 4.2 mg/dL (ref 2.4–7.0)

## 2013-12-26 MED ORDER — BENZONATATE 100 MG PO CAPS
200.0000 mg | ORAL_CAPSULE | Freq: Three times a day (TID) | ORAL | Status: DC | PRN
  Administered 2013-12-27: 200 mg via ORAL
  Filled 2013-12-26 (×2): qty 2

## 2013-12-26 MED ORDER — LOSARTAN POTASSIUM 50 MG PO TABS
50.0000 mg | ORAL_TABLET | Freq: Every day | ORAL | Status: DC
  Administered 2013-12-27: 50 mg via ORAL
  Filled 2013-12-26: qty 1

## 2013-12-26 NOTE — Plan of Care (Signed)
Problem: Phase II Progression Outcomes Goal: Progress activity as tolerated unless otherwise ordered Outcome: Completed/Met Date Met:  12/26/13 Goal: Discharge plan established Outcome: Completed/Met Date Met:  12/26/13 Goal: Vital signs remain stable Outcome: Completed/Met Date Met:  12/26/13 Goal: IV changed to normal saline lock Outcome: Completed/Met Date Met:  12/26/13 Goal: Obtain order to discontinue catheter if appropriate Outcome: Not Applicable Date Met:  30/05/11

## 2013-12-26 NOTE — Progress Notes (Signed)
[facility]  SOLARA GOODCHILD IZT:[id number] DOB: Jul 15, 1935 DOA: 12/23/2013 PCP: Noralee Space, MD  Brief narrative: 78 y.o. female w/ h/o HTN, HLD, and chronic headaches, who presented to the ED with c/o headache. She had nonproductive cough for 2 days. Seen by Dr. Lenna Gilford on 10/27 and was started on Levaquin and Mucinex for ABR. She presented to the [facility] and BPs were reportedly running around the 382N systolic. She was found to be hyponatremic to 119. Transferred to [facility] for hyponatremia.   At arrival at [place] her SBP was noted to be running consistently in the 220s and she stated her headache had worsened. She was neurologically intact. It was felt she was hypovolemic so NS IVFs were initiated.  HPI/Subjective: Pt is alert and at he neurologic baseline.  She c/o a mild occipital HA, but denies sob, cp, n/v, or abdom pain.  She states all the BP meds "make her feel funny" and states "I just can't take them."    Assessment/Plan:  Hypertensive emergency BP much better controlled at this time - off cardene gtt - attempting to avoid overcorrection - decrease oral meds as BP at times dipping below 110 and follow - avoid diuretic for now   Acute Hyponatremia Etiology unclear, but now suspect may have simply been due to HCTZ - initially felt to be 2/2 DH but Na went down after NS fluids so dc'd fluids - urine sp gravity was 1.009 - urine sodium markedly elevated at 143 (c/w preadmit HCTZ +/- SIADH) - preadmit meds reviewed and no other offending meds found - Na is climbing w/ fluid restriction and d/c of HCTZ - cont to follow - avoid diuretics   Occipital Headache  Has known cervical spondylosis but suspect HA from elevated BP- now improved w/ improved BP  Acute encephalopathy From severe hyponatremia and HTN emergency - CT Head unremarkable for acute changes - resolved w/ climbing Na and improved BP  Bronchitis, chronic obstructive w acute bronchitis No  present indication for continuing abx - currently not coughing and no sinusitis seen on CT  Mixed hyperlipidemia Apparent h/o myalgia so not on statin but was on fish oil at home  DIZZINESS, CHRONIC   Coronary artery disease Currently no sx's    DVT prophylaxis: SQ Heparin Code Status: Full Family Communication: spoke w/ daughter at bedside  Disposition Plan/Expected LOS: stable for transer to tele bed - PT/OT - possible D/C next 24-48hrs  Consultants: None  Procedures: None  Cultures: None  Antibiotics: Levaquin 10/30  Objective: Blood pressure 114/62, pulse 64, temperature 97.8 F (36.6 C), temperature source Oral, resp. rate 15, height 4\' 10"  (1.473 m), weight 60.9 kg (134 lb 4.2 oz), last menstrual period 11/26/1990, SpO2 95 %.  Intake/Output Summary (Last 24 hours) at 12/26/13 1300 Last data filed at 12/26/13 1100  Gross per 24 hour  Intake      0 ml  Output   1415 ml  Net  -1415 ml   Exam: Gen: No acute respiratory distress - alert and oriented  Chest: Clear to auscultation bilaterally without wheezes or crackles Cardiac: Regular rate and rhythm, no rubs murmurs or gallops Abdomen: Soft nontender nondistended without obvious hepatosplenomegaly, no ascites Extremities: no signif cyanosis, clubbing, or edema B LE   Scheduled Meds:  Scheduled Meds: . aspirin EC  81 mg Oral Daily  . fluticasone  2 spray Each Nare Daily  . heparin  5,000 Units Subcutaneous 3 times  per day  . isosorbide mononitrate  30 mg Oral Daily  . latanoprost  1 drop Both Eyes QHS  . losartan  100 mg Oral Daily  . metoprolol  50 mg Oral BID  . pantoprazole  40 mg Oral Q1200  . saccharomyces boulardii  250 mg Oral BID   Data Reviewed: Basic Metabolic Panel:  Recent Labs Lab 12/24/13 0230 12/24/13 0905 12/24/13 1600 12/25/13 0315 12/26/13 0305  NA 118* 118* 119* 122* 123*  K 3.8 3.9 4.5 3.9 4.6  CL 79* 77* 80* 83* 85*  CO2 22 21 23 23 22   GLUCOSE 131* 114* 119* 91 87  BUN 9  9 11 13 15   CREATININE 0.45* 0.45* 0.49* 0.65 0.64  CALCIUM 9.0 9.4 9.4 9.5 9.5   Liver Function Tests: No results for input(s): AST, ALT, ALKPHOS, BILITOT, PROT, ALBUMIN in the last 168 hours.  CBC:  Recent Labs Lab 12/23/13 1744 12/24/13 0230  WBC 10.4 8.5  NEUTROABS 5.5  --   HGB 13.0 12.5  HCT 36.2 35.7*  MCV 84.8 83.2  PLT 296 266   Cardiac Enzymes:  Recent Labs Lab 12/23/13 1744  TROPONINI <0.30    Recent Results (from the past 240 hour(s))  MRSA PCR Screening     Status: None   Collection Time: 12/23/13 10:19 PM  Result Value Ref Range Status   MRSA by PCR NEGATIVE NEGATIVE Final    Comment:        The GeneXpert MRSA Assay (FDA approved for NASAL specimens only), is one component of a comprehensive MRSA colonization surveillance program. It is not intended to diagnose MRSA infection nor to guide or monitor treatment for MRSA infections.     Studies:  Recent x-ray studies have been reviewed in detail by the Attending Physician  Time spent :  53 mins  Cherene Altes, MD [facility] For Consults/Admissions - Flow Manager - [phone] Office  [phone] Pager [phone]  On-Call/Text Page:      [email or web address]      password [email or web address]  12/26/2013, 1:00 PM   LOS: 3 days

## 2013-12-27 ENCOUNTER — Encounter (HOSPITAL_BASED_OUTPATIENT_CLINIC_OR_DEPARTMENT_OTHER): Payer: Self-pay | Admitting: Emergency Medicine

## 2013-12-27 DIAGNOSIS — J440 Chronic obstructive pulmonary disease with acute lower respiratory infection: Secondary | ICD-10-CM

## 2013-12-27 LAB — CBC
HCT: 38.9 % (ref 36.0–46.0)
Hemoglobin: 13.8 g/dL (ref 12.0–15.0)
MCH: 30.4 pg (ref 26.0–34.0)
MCHC: 35.5 g/dL (ref 30.0–36.0)
MCV: 85.7 fL (ref 78.0–100.0)
Platelets: 278 10*3/uL (ref 150–400)
RBC: 4.54 MIL/uL (ref 3.87–5.11)
RDW: 14 % (ref 11.5–15.5)
WBC: 7.5 10*3/uL (ref 4.0–10.5)

## 2013-12-27 LAB — COMPREHENSIVE METABOLIC PANEL
ALT: 21 U/L (ref 0–35)
ANION GAP: 15 (ref 5–15)
AST: 20 U/L (ref 0–37)
Albumin: 3.7 g/dL (ref 3.5–5.2)
Alkaline Phosphatase: 87 U/L (ref 39–117)
BILIRUBIN TOTAL: 0.8 mg/dL (ref 0.3–1.2)
BUN: 14 mg/dL (ref 6–23)
CALCIUM: 9.5 mg/dL (ref 8.4–10.5)
CHLORIDE: 95 meq/L — AB (ref 96–112)
CO2: 24 meq/L (ref 19–32)
Creatinine, Ser: 0.64 mg/dL (ref 0.50–1.10)
GFR, EST NON AFRICAN AMERICAN: 85 mL/min — AB (ref >90)
Glucose, Bld: 90 mg/dL (ref 70–99)
Potassium: 4.3 mEq/L (ref 3.7–5.3)
Sodium: 134 mEq/L — ABNORMAL LOW (ref 137–147)
Total Protein: 7 g/dL (ref 6.0–8.3)

## 2013-12-27 MED ORDER — LOSARTAN POTASSIUM 50 MG PO TABS: 50.0000 mg | ORAL_TABLET | Freq: Every day | ORAL | Status: DC

## 2013-12-27 MED ORDER — BENZONATATE 200 MG PO CAPS: 200.0000 mg | ORAL_CAPSULE | Freq: Three times a day (TID) | ORAL | Status: DC | PRN

## 2013-12-27 NOTE — Discharge Summary (Addendum)
Physician Discharge Summary  NYSA SARIN WCB:[id number] DOB: 1935-07-31 DOA: 12/23/2013  PCP: Noralee Space, MD  Admit date: 12/23/2013 Discharge date: 12/27/2013  Time spent: > 30 minutes  Recommendations for Outpatient Follow-up:  1. Call to arrange follow up appointment with Dr. Lenna Gilford for 7-10 days post discharge 2. HCTZ stopped this admit due to severe hyponatremia 3.   instructed to resume pre admission Levaquin  Discharge Diagnoses:    Headache - occipital - due to HTN emergency   Hypertensive emergency - resolved   Acute encephalopathy - due to hyponatremia (metabolic) + HTN emergency - resolved   Bronchitis, chronic obstructive w acute bronchitis   Severe Hyponatremia due to HCTZ   Mixed hyperlipidemia   DIZZINESS, CHRONIC   Cervical spondylolysis   CAD (coronary artery disease)   Discharge Condition: stable  Diet recommendation: Heart Healthy  Filed Weights   12/23/13 2232 12/24/13 0700  Weight: 136 lb 7.4 oz (61.9 kg) 134 lb 4.2 oz (60.9 kg)    History of present illness:  78 y.o. female w/ h/o HTN, HLD, and chronic headaches, who presented to the ED with c/o headache. She had nonproductive cough for 2 days. Seen by Dr. Lenna Gilford on 10/27 and was started on Levaquin and Mucinex for ABR. She presented to the [facility] and BPs were reportedly running around the 616W systolic. She was found to be hyponatremic to 119. Transferred to [facility] for hyponatremia.   At arrival at [place] her SBP was noted to be running consistently in the 220s and she stated her headache had worsened. She was neurologically intact. It was felt she was hypovolemic so NS IVFs were initiated.  Hospital Course:   Hypertensive emergency Initially required Cardene drip which was rapidly weaned. BP much better controlled at this time.  Will discharge on Lopressor and Cozaar but have stopped HCTZ (see below)  Acute Hyponatremia Initially etiology unclear, but now suspect may have simply been due to  HCTZ. At time of admission felt to be 2/2 dehydration but sodium went down after NS fluids so stopped fluids and fluid restriction initiated. Urine sp gravity was 1.009 and inital urine sodium markedly elevated at 143 (c/w preadmit HCTZ +/- SIADH). Follow up urine sodium prior to dc down to <20 in absence of diuretic tx. Will not resume HCTZ and avoid diuretics if possible in the future.  No evidence to suggest SIADH  Occipital Headache  Has known cervical spondylosis but suspect HA from elevated BP. Has resolved with correction of BP.  CT head was w/o acute findings.  Acute encephalopathy Due to severe hyponatremia and HTN emergency. CT Head was unremarkable for acute changes. Resolved with resolution of hyponatremia and correction of HTN.  Bronchitis, chronic obstructive w acute bronchitis Immediately after admission cough had resolved but over the past 2 days has recurred and with yellow-green sputum so she was instructed to resume pre admission Levaquin.  Mixed hyperlipidemia Apparent h/o myalgia so not on statin but was on fish oil at home  DIZZINESS, CHRONIC   Coronary artery disease Currently no sx's  Procedures:  None  Consultations:  None  Discharge Exam: Filed Vitals:   12/27/13 1353  BP: 148/60  Pulse: 70  Temp: 97.9 F (36.6 C)  Resp: 18   Gen: No acute respiratory distress - alert and oriented, up in chair Chest: Clear to auscultation bilaterally -no wheezing Cardiac: Regular rate and rhythm, no rubs murmurs or gallops Abdomen: Soft nontender nondistended without obvious hepatosplenomegaly, no ascites Extremities: no  cyanosis, clubbing, or edema B LE   Discharge Instructions    Call MD for:  extreme fatigue    Complete by:  As directed      Call MD for:  persistant dizziness or light-headedness    Complete by:  As directed      Call MD for:  severe uncontrolled pain    Complete by:  As directed      Call MD for:  temperature >100.4    Complete by:  As  directed      Diet general    Complete by:  As directed      Increase activity slowly    Complete by:  As directed           Current Discharge Medication List    START taking these medications   Details  benzonatate (TESSALON) 200 MG capsule Take 1 capsule (200 mg total) by mouth 3 (three) times daily as needed for cough. Qty: 20 capsule, Refills: 0    losartan (COZAAR) 50 MG tablet Take 1 tablet (50 mg total) by mouth daily. Qty: 30 tablet, Refills: 0      CONTINUE these medications which have NOT CHANGED   Details  aspirin EC 81 MG tablet Take 81 mg by mouth daily.    bifidobacterium infantis (ALIGN) capsule Take 1 capsule by mouth daily.    Diphenhyd-Hydrocort-Nystatin (FIRST-DUKES MOUTHWASH) SUSP Use as directed 5 mLs in the mouth or throat 4 (four) times daily as needed (mouth pain). Gargle and Swallow    fexofenadine (ALLEGRA) 180 MG tablet Take 180 mg by mouth daily.      fluocinonide (LIDEX) 0.05 % external solution Apply 1 application topically daily as needed (scalp itching).    Associated Diagnoses: History of breast cancer    Glucosamine-Chondroit-Vit C-Mn (GLUCOSAMINE CHONDR 1500 COMPLX PO) Take 1 tablet by mouth 3 (three) times daily.      guaiFENesin (MUCINEX) 600 MG 12 hr tablet Take 1,200 mg by mouth 2 (two) times daily.    hyoscyamine (ANASPAZ) 0.125 MG TBDP disintergrating tablet Place 0.125 mg under the tongue every 6 (six) hours as needed for bladder spasms or cramping.    ibuprofen (ADVIL,MOTRIN) 200 MG tablet Take 200 mg by mouth daily as needed (pain).    isosorbide mononitrate (IMDUR) 30 MG 24 hr tablet Take 1 tablet (30 mg total) by mouth daily. Qty: 90 tablet, Refills: 1    ketoconazole (NIZORAL) 2 % shampoo Apply 1 application topically 2 (two) times a week. As needed for itching a couple of times week   Associated Diagnoses: History of breast cancer    latanoprost (XALATAN) 0.005 % ophthalmic solution Place 1 drop into both eyes at bedtime.      letrozole (FEMARA) 2.5 MG tablet Take 1 tablet (2.5 mg total) by mouth daily. Qty: 90 tablet, Refills: 3   Associated Diagnoses: History of breast cancer    levofloxacin (LEVAQUIN) 500 MG tablet Take 1 tablet (500 mg total) by mouth daily. Qty: 7 tablet, Refills: 0    metoprolol (LOPRESSOR) 50 MG tablet Take 1 tablet (50 mg total) by mouth 2 (two) times daily. Qty: 60 tablet, Refills: 1    mometasone (NASONEX) 50 MCG/ACT nasal spray Place 2 sprays into the nose daily as needed (congestion).     nitroGLYCERIN (NITROSTAT) 0.4 MG SL tablet Place 0.4 mg under the tongue every 5 (five) minutes as needed for chest pain.    Omega-3 Fatty Acids (FISH OIL) 1200 MG CAPS Take 1,200 mg  by mouth daily.     omeprazole (PRILOSEC) 40 MG capsule Take 40 mg by mouth daily. 30 minutes before 1st meal of the day    tiZANidine (ZANAFLEX) 2 MG tablet Take 2 mg by mouth at bedtime.    vitamin B-12 (CYANOCOBALAMIN) 1000 MCG tablet Take 1,000 mcg by mouth daily.        STOP taking these medications     losartan-hydrochlorothiazide (HYZAAR) 100-12.5 MG per tablet        Allergies  Allergen Reactions  . Choline Fenofibrate Other (See Comments)     pt states INTOL to Trilipix w/ "thigh burning"  . Simvastatin Other (See Comments)     pt states INTOL to STATINS \\T \ refuses to restart  . Hydrocodone     Nightmare after taking cough syrup w/hydrocodone  . Adhesive [Tape] Rash  . Ceclor [Cefaclor] Rash  . Clarithromycin Rash  . Codeine Nausea Only  . Doxycycline Rash  . Lisinopril Cough    Developed ACE cough...  . Penicillins Itching and Rash    At injection site  . Tobramycin-Dexamethasone Rash   Follow-up Information    Follow up with NADEL,SCOTT M, MD.   Specialty:  Pulmonary Disease   Contact information:   [place] [address] [phone]      Significant Diagnostic Studies: Dg Chest 2 View  12/23/2013   CLINICAL DATA:  Cough and dizziness  EXAM: CHEST  2 VIEW   COMPARISON:  December 21, 2013  FINDINGS: There is underlying emphysematous change. There is no edema or consolidation. Heart is upper normal in size with pulmonary vascularity within normal limits. No adenopathy. There is degenerative change in the thoracic spine. Patient is status post coronary artery bypass grafting.  IMPRESSION: Underlying emphysematous change.  No edema or consolidation.   Electronically Signed   By: Lowella Grip M.D.   On: 12/23/2013 18:41   Dg Chest 2 View  12/22/2013   CLINICAL DATA:  Cough and chest congestion.  EXAM: CHEST  2 VIEW  COMPARISON:  06/17/2012  FINDINGS: Heart size and pulmonary vascularity are normal and the lungs are clear but are somewhat hyperinflated with flattening of the diaphragm consistent with emphysema. No effusions.  CABG.  No acute osseous abnormality.  IMPRESSION: No acute abnormalities.  Hyperinflated lungs consistent with COPD.   Electronically Signed   By: Rozetta Nunnery M.D.   On: 12/22/2013 08:13   Ct Head Wo Contrast  12/23/2013   CLINICAL DATA:  Cough, chest pain, headache and dizziness  EXAM: CT HEAD WITHOUT CONTRAST  TECHNIQUE: Contiguous axial images were obtained from the base of the skull through the vertex without intravenous contrast.  COMPARISON:  CT 11/02/2012  FINDINGS: No acute intracranial hemorrhage. No focal mass lesion. No CT evidence of acute infarction. No midline shift or mass effect. No hydrocephalus. Basilar cisterns are patent.  There is periventricular and white matter hypodensities not changed from prior. Generalized cortical atrophy unchanged.  Paranasal sinuses and  mastoid air cells are clear.  IMPRESSION: 1. No acute intracranial findings. 2. Atrophy and white matter microvascular disease are not changed from prior.   Electronically Signed   By: Suzy Bouchard M.D.   On: 12/23/2013 18:34   Dg Abd Portable 1v  12/24/2013   CLINICAL DATA:  78 year old female with acute vomiting. Initial encounter.  EXAM: PORTABLE  ABDOMEN - 1 VIEW  COMPARISON:  CT Abdomen and Pelvis 11/07/2011.  FINDINGS: Portable AP supine view at 1333 hrs. Non obstructed bowel  gas pattern. There may be some dilute contrast in the small bowel, uncertain. Abdominal and pelvic visceral contours are stable. Stable lung bases. No definite pneumoperitoneum on this supine view. No acute osseous abnormality identified.  IMPRESSION: Negative.   Electronically Signed   By: Lars Pinks M.D.   On: 12/24/2013 14:04    Microbiology: Recent Results (from the past 240 hour(s))  MRSA PCR Screening     Status: None   Collection Time: 12/23/13 10:19 PM  Result Value Ref Range Status   MRSA by PCR NEGATIVE NEGATIVE Final    Comment:        The GeneXpert MRSA Assay (FDA approved for NASAL specimens only), is one component of a comprehensive MRSA colonization surveillance program. It is not intended to diagnose MRSA infection nor to guide or monitor treatment for MRSA infections.     Labs: Basic Metabolic Panel:  Recent Labs Lab 12/24/13 0905 12/24/13 1600 12/25/13 0315 12/26/13 0305 12/27/13 0404  NA 118* 119* 122* 123* 134*  K 3.9 4.5 3.9 4.6 4.3  CL 77* 80* 83* 85* 95*  CO2 21 23 23 22 24   GLUCOSE 114* 119* 91 87 90  BUN 9 11 13 15 14   CREATININE 0.45* 0.49* 0.65 0.64 0.64  CALCIUM 9.4 9.4 9.5 9.5 9.5   Liver Function Tests:  Recent Labs Lab 12/27/13 0404  AST 20  ALT 21  ALKPHOS 87  BILITOT 0.8  PROT 7.0  ALBUMIN 3.7    Recent Labs Lab 12/24/13 1600  LIPASE 11   CBC:  Recent Labs Lab 12/23/13 1744 12/24/13 0230 12/27/13 0404  WBC 10.4 8.5 7.5  NEUTROABS 5.5  --   --   HGB 13.0 12.5 13.8  HCT 36.2 35.7* 38.9  MCV 84.8 83.2 85.7  PLT 296 266 278    Recent Labs Lab 12/23/13 1744  TROPONINI <0.30   Signed:  ELLIS,ALLISON L. ANP [facility] 12/27/2013, 2:02 PM  I have personally examined this patient and reviewed the entire database. I have reviewed the above note, made any necessary editorial  changes, and agree with its content.  Cherene Altes, MD [facility]

## 2013-12-27 NOTE — Plan of Care (Signed)
Problem: Phase II Progression Outcomes Goal: Other Phase II Outcomes/Goals Outcome: Completed/Met Date Met:  12/27/13     

## 2013-12-27 NOTE — Progress Notes (Signed)
Pt A&O x4; pt discharge education and instructions completed with pt and spouse at bedside and both denies any questions. Pt IV and telemetry removed. Pt handed her prescription for losartan and benzonatate. Pt spouse informs RN pt can't find her glasses and it might have lost in transition during her stay in the hospital or somewhere at home but pt insist she had her eye glasses on when she came to the hospital. Charge RN informed. Pt transported off unit via wheelchair with her belongings to the side. Pt discharge home with spouse to transport her home. Pt informed someone to follow up with her concerning her missing eye glasses. Francis Gaines Erilyn Pearman RN.

## 2013-12-27 NOTE — Evaluation (Signed)
Occupational Therapy Evaluation and Discharge Patient Details Name: [name] MRN: [id number] DOB: 1935-08-28 Today's Date: 12/27/2013    History of Present Illness Headache that was getting worse--found to be HTN and hyponatremic. PMHx: CAD s/p CABF, COPD, HTN, chronic headaches, dizziness, arthirtis of cervical spine, fibromyalgia   Clinical Impression   This 78 yo female admitted for above presents to acute OT at an Independent to Mod I level. No further OT needs identified as well as no PT needs identified as well (made them aware). Acute OT will sign off.    Follow Up Recommendations  No OT follow up    Equipment Recommendations  None recommended by OT       Precautions / Restrictions Precautions Precautions: None      Mobility Bed Mobility Overal bed mobility: Independent                Transfers Overall transfer level: Independent               General transfer comment: Up and down one step Mod I (increased time and holding onto rail)    Balance Overall balance assessment:  (no reports of dizziness)                                          ADL Overall ADL's : Independent                                                       Pertinent Vitals/Pain Pain Assessment: No/denies pain     Hand Dominance Right   Extremity/Trunk Assessment Upper Extremity Assessment Upper Extremity Assessment: Overall WFL for tasks assessed   Lower Extremity Assessment Lower Extremity Assessment: Overall WFL for tasks assessed       Communication Communication Communication: No difficulties   Cognition Arousal/Alertness: Awake/alert Behavior During Therapy: WFL for tasks assessed/performed Overall Cognitive Status: Within Functional Limits for tasks assessed                                Home Living Family/patient expects to be discharged to:: Private residence Living Arrangements:  Spouse/significant other Available Help at Discharge: Family;Available PRN/intermittently Type of Home: House Home Access: Stairs to enter [employer] of Steps: 1 Entrance Stairs-Rails: Right Home Layout: One level     Bathroom Shower/Tub: Tub/shower unit;Curtain Shower/tub characteristics: Architectural technologist: Standard     Home Equipment: None          Prior Functioning/Environment Level of Independence: Independent             OT Diagnosis: Generalized weakness         OT Goals(Current goals can be found in the care plan section) Acute Rehab OT Goals Patient Stated Goal: did not ask--pt independent to Mod I  OT Frequency:                End of Session Equipment Utilized During Treatment:  (none) Nurse Communication: Mobility status (safe to be up by herself)  Activity Tolerance: Patient tolerated treatment well Patient left:  (sitting EOB with nursing student)   Time: 4193-7902 OT Time Calculation (min): 16 min Charges:  OT General Charges $  OT Visit: 1 Procedure OT Evaluation $Initial OT Evaluation Tier I: 1 Procedure OT Treatments $Self Care/Home Management : 8-22 mins  Almon Register [phone] 12/27/2013, 8:58 AM

## 2013-12-27 NOTE — Clinical Documentation Improvement (Addendum)
.   Document the etiology of the acute encephalopathy, if known:   Hypertensive Alcoholic Anoxic/hypoxic Drug-induced/toxic (specify drug) Metabolic/septic Wernicke Other (specify)   Supporting Information:  Headache and acute encephalopathy noted per 10/29 progress notes. Hypertensive urgency - patient getting PRN labetalol here in ICU. Unclear cause, question if patient's OTC mucinex may have had phenylephrine in it. First dose of 10mg  IV had minimal effect (patient was still in 210s SBP) so a second dose of 10mg  IV was attempted, per 10/29 progress notes.   Thank Sherian Maroon Documentation Specialist [phone] [email or web address]  Please see my note of 11/1 in which I provide this information below the heading "encephalopathy."  Cherene Altes, MD [facility] For Consults/Admissions - Flow Manager - [phone] Office  [phone] Pager [phone]  On-Call/Text Page:      [email or web address]      password [facility]

## 2013-12-27 NOTE — Evaluation (Signed)
Physical Therapy Evaluation and Discharge Patient Details Name: [name] MRN: [id number] DOB: 10-04-1935 Today's Date: 12/27/2013   History of Present Illness  Headache that was getting worse--found to be HTN and hyponatremic. PMHx: CAD s/p CABF, COPD, HTN, chronic headaches, dizziness, arthirtis of cervical spine, fibromyalgia  Clinical Impression  Patient evaluated by Physical Therapy with no further acute PT needs identified. All education has been completed and the patient has no further questions. Patient able to safely perform challenging dynamic gait activities and ascend/descend step without assistance. Per pt report she is at her baseline for functional mobility See below for any follow-up Physial Therapy or equipment needs. PT is signing off. Thank you for this referral.     Follow Up Recommendations No PT follow up    Equipment Recommendations  None recommended by PT    Recommendations for Other Services       Precautions / Restrictions Precautions Precautions: None      Mobility  Bed Mobility Overal bed mobility: Independent                Transfers Overall transfer level: Modified independent Equipment used: None             General transfer comment: safely places hands on stable surface to rise  Ambulation/Gait Ambulation/Gait assistance: Supervision Ambulation Distance (Feet): 1000 Feet Assistive device: None Gait Pattern/deviations: Step-through pattern   Gait velocity interpretation: at or above normal speed for age/gender General Gait Details: Ambulates generally well. Pt performed dynamic gait challenges including high marching, variable speeds, head turns, and backwards stepping with only minor deviations in gait, but no loss of balance requiring assistance.  Stairs Stairs: Yes Stairs assistance: Supervision Stair Management: No rails;Step to pattern;Forwards Number of Stairs: 1 General stair comments: Pt able to safely  complete this task without physical assist or loss of balance.  Wheelchair Mobility    Modified Rankin (Stroke Patients Only)       Balance Overall balance assessment: Modified Independent                                           Pertinent Vitals/Pain Pain Assessment: No/denies pain    Home Living Family/patient expects to be discharged to:: Private residence Living Arrangements: Spouse/significant other Available Help at Discharge: Family;Available PRN/intermittently Type of Home: House Home Access: Stairs to enter Entrance Stairs-Rails: Right Entrance Stairs-Number of Steps: 1 Home Layout: One level Home Equipment: None      Prior Function Level of Independence: Independent               Hand Dominance   Dominant Hand: Right    Extremity/Trunk Assessment   Upper Extremity Assessment: Defer to OT evaluation           Lower Extremity Assessment: Overall WFL for tasks assessed         Communication   Communication: No difficulties  Cognition Arousal/Alertness: Awake/alert Behavior During Therapy: WFL for tasks assessed/performed Overall Cognitive Status: Within Functional Limits for tasks assessed                      General Comments General comments (skin integrity, edema, etc.): Pt with no further questions, states she feels confident with her ability to mobilize and is at her baseline ability.    Exercises        Assessment/Plan    PT  Assessment Patent does not need any further PT services  PT Diagnosis Acute pain   PT Problem List    PT Treatment Interventions     PT Goals (Current goals can be found in the Care Plan section) Acute Rehab PT Goals Patient Stated Goal: none stated PT Goal Formulation: All assessment and education complete, DC therapy    Frequency     Barriers to discharge        Co-evaluation               End of Session Equipment Utilized During Treatment: Gait  belt Activity Tolerance: Patient tolerated treatment well Patient left: in chair;with call bell/phone within reach Nurse Communication: Mobility status;Other (comment) (May walk independently)         Time: 0954-1008 PT Time Calculation (min): 14 min   Charges:   PT Evaluation $Initial PT Evaluation Tier I: 1 Procedure     PT G CodesEllouise Newer 12/27/2013, 10:19 AM  Elayne Snare, [place]

## 2013-12-27 NOTE — Discharge Instructions (Signed)
Losartan tablets What is this medicine? LOSARTAN (loe SAR tan) is used to treat high blood pressure and to reduce the risk of stroke in certain patients. This drug also slows the progression of kidney disease in patients with diabetes. This medicine may be used for other purposes; ask your health care provider or pharmacist if you have questions. COMMON BRAND NAME(S): Cozaar What should I tell my health care provider before I take this medicine? They need to know if you have any of these conditions: -heart failure -kidney or liver disease -an unusual or allergic reaction to losartan, other medicines, foods, dyes, or preservatives -pregnant or trying to get pregnant -breast-feeding How should I use this medicine? Take this medicine by mouth with a glass of water. Follow the directions on the prescription label. This medicine can be taken with or without food. Take your doses at regular intervals. Do not take your medicine more often than directed. Talk to your pediatrician regarding the use of this medicine in children. Special care may be needed. Overdosage: If you think you have taken too much of this medicine contact a poison control center or emergency room at once. NOTE: This medicine is only for you. Do not share this medicine with others. What if I miss a dose? If you miss a dose, take it as soon as you can. If it is almost time for your next dose, take only that dose. Do not take double or extra doses. What may interact with this medicine? -blood pressure medicines -diuretics, especially triamterene, spironolactone, or amiloride -fluconazole -NSAIDs, medicines for pain and inflammation, like ibuprofen or naproxen -potassium salts or potassium supplements -rifampin This list may not describe all possible interactions. Give your health care provider a list of all the medicines, herbs, non-prescription drugs, or dietary supplements you use. Also tell them if you smoke, drink alcohol, or  use illegal drugs. Some items may interact with your medicine. What should I watch for while using this medicine? Visit your doctor or health care professional for regular checks on your progress. Check your blood pressure as directed. Ask your doctor or health care professional what your blood pressure should be and when you should contact him or her. Call your doctor or health care professional if you notice an irregular or fast heart beat. Women should inform their doctor if they wish to become pregnant or think they might be pregnant. There is a potential for serious side effects to an unborn child, particularly in the second or third trimester. Talk to your health care professional or pharmacist for more information. You may get drowsy or dizzy. Do not drive, use machinery, or do anything that needs mental alertness until you know how this drug affects you. Do not stand or sit up quickly, especially if you are an older patient. This reduces the risk of dizzy or fainting spells. Alcohol can make you more drowsy and dizzy. Avoid alcoholic drinks. Avoid salt substitutes unless you are told otherwise by your doctor or health care professional. Do not treat yourself for coughs, colds, or pain while you are taking this medicine without asking your doctor or health care professional for advice. Some ingredients may increase your blood pressure. What side effects may I notice from receiving this medicine? Side effects that you should report to your doctor or health care professional as soon as possible: -confusion, dizziness, light headedness or fainting spells -decreased amount of urine passed -difficulty breathing or swallowing, hoarseness, or tightening of the throat -fast or  irregular heart beat, palpitations, or chest pain -skin rash, itching -swelling of your face, lips, tongue, hands, or feet Side effects that usually do not require medical attention (report to your doctor or health care  professional if they continue or are bothersome): -cough -decreased sexual function or desire -headache -nasal congestion or stuffiness -nausea or stomach pain -sore or cramping muscles This list may not describe all possible side effects. Call your doctor for medical advice about side effects. You may report side effects to FDA at 1-800-FDA-1088. Where should I keep my medicine? Keep out of the reach of children. Store at room temperature between 15 and 30 degrees C (59 and 86 degrees F). Protect from light. Keep container tightly closed. Throw away any unused medicine after the expiration date. NOTE: This sheet is a summary. It may not cover all possible information. If you have questions about this medicine, talk to your doctor, pharmacist, or health care provider.  2015, [employer]vier/Gold Standard. (2007-04-24 16:42:18) Hyponatremia  Hyponatremia is when the amount of salt (sodium) in your blood is too low. When sodium levels are low, your cells will absorb extra water and swell. The swelling happens throughout the body, but it mostly affects the brain. Severe brain swelling (cerebral edema), seizures, or coma can happen.  CAUSES   Heart, kidney, or liver problems.  Thyroid problems.  Adrenal gland problems.  Severe vomiting and diarrhea.  Certain medicines or illegal drugs.  Dehydration.  Drinking too much water.  Low-sodium diet. SYMPTOMS   Nausea and vomiting.  Confusion.  Lethargy.  Agitation.  Headache.  Twitching or shaking (seizures).  Unconsciousness.  Appetite loss.  Muscle weakness and cramping. DIAGNOSIS  Hyponatremia is identified by a simple blood test. Your caregiver will perform a history and physical exam to try to find the cause and type of hyponatremia. Other tests may be needed to measure the amount of sodium in your blood and urine. TREATMENT  Treatment will depend on the cause.   Fluids may be given through the vein (IV).  Medicines may  be used to correct the sodium imbalance. If medicines are causing the problem, they will need to be adjusted.  Water or fluid intake may be restricted to restore proper balance. The speed of correcting the sodium problem is very important. If the problem is corrected too fast, nerve damage (sometimes unchangeable) can happen. HOME CARE INSTRUCTIONS   Only take medicines as directed by your caregiver. Many medicines can make hyponatremia worse. Discuss all your medicines with your caregiver.  Carefully follow any recommended diet, including any fluid restrictions.  You may be asked to repeat lab tests. Follow these directions.  Avoid alcohol and recreational drugs. SEEK MEDICAL CARE IF:   You develop worsening nausea, fatigue, headache, confusion, or weakness.  Your original hyponatremia symptoms return.  You have problems following the recommended diet. SEEK IMMEDIATE MEDICAL CARE IF:   You have a seizure.  You faint.  You have ongoing diarrhea or vomiting. MAKE SURE YOU:   Understand these instructions.  Will watch your condition.  Will get help right away if you are not doing well or get worse. Document Released: 02/01/2002 Document Revised: 05/06/2011 Document Reviewed: 07/29/2010 [facility] Patient Information 2015 [place], [place]. This information is not intended to replace advice given to you by your health care provider. Make sure you discuss any questions you have with your health care provider.

## 2013-12-28 DIAGNOSIS — M5032 Other cervical disc degeneration, mid-cervical region: Secondary | ICD-10-CM | POA: Diagnosis not present

## 2013-12-28 DIAGNOSIS — M9901 Segmental and somatic dysfunction of cervical region: Secondary | ICD-10-CM | POA: Diagnosis not present

## 2013-12-28 DIAGNOSIS — M5412 Radiculopathy, cervical region: Secondary | ICD-10-CM | POA: Diagnosis not present

## 2013-12-28 DIAGNOSIS — S134XXA Sprain of ligaments of cervical spine, initial encounter: Secondary | ICD-10-CM | POA: Diagnosis not present

## 2013-12-28 DIAGNOSIS — S13130A Subluxation of C2/C3 cervical vertebrae, initial encounter: Secondary | ICD-10-CM | POA: Diagnosis not present

## 2013-12-28 NOTE — Care Management Note (Signed)
    Page 1 of 1   12/28/2013     4:59:40 PM CARE MANAGEMENT NOTE 12/28/2013  Patient:  [name], [name]   Account Number:  [id number]  Date Initiated:  12/24/2013  Documentation initiated by:  [facility]  Subjective/Objective Assessment:     Action/Plan:   Anticipated DC Date:     Anticipated DC Plan:  [place]  CM consult      Choice offered to / List presented to:             Status of service:  Completed, signed off Medicare Important Message given?  YES (If response is "NO", the following Medicare IM given date fields will be blank) Date Medicare IM given:  12/27/2013 Medicare IM given by:  Kyeisha Janowicz Date Additional Medicare IM given:   Additional Medicare IM given by:    Discharge Disposition:  HOME/SELF CARE  Per UR Regulation:  Reviewed for med. necessity/level of care/duration of stay  If discussed at [place] of Stay Meetings, dates discussed:    Comments:

## 2013-12-29 ENCOUNTER — Telehealth: Payer: Self-pay | Admitting: Pulmonary Disease

## 2013-12-29 ENCOUNTER — Encounter (HOSPITAL_BASED_OUTPATIENT_CLINIC_OR_DEPARTMENT_OTHER): Payer: Self-pay

## 2013-12-29 ENCOUNTER — Emergency Department (HOSPITAL_BASED_OUTPATIENT_CLINIC_OR_DEPARTMENT_OTHER)
Admission: EM | Admit: 2013-12-29 | Discharge: 2013-12-29 | Disposition: A | Discharge status: Home / Self Care | Payer: Medicare Other | Attending: Emergency Medicine | Admitting: Emergency Medicine

## 2013-12-29 DIAGNOSIS — I10 Essential (primary) hypertension: Secondary | ICD-10-CM | POA: Insufficient documentation

## 2013-12-29 DIAGNOSIS — Z880 Allergy status to penicillin: Secondary | ICD-10-CM | POA: Diagnosis not present

## 2013-12-29 DIAGNOSIS — Z86010 Personal history of colonic polyps: Secondary | ICD-10-CM | POA: Insufficient documentation

## 2013-12-29 DIAGNOSIS — Z79899 Other long term (current) drug therapy: Secondary | ICD-10-CM | POA: Diagnosis not present

## 2013-12-29 DIAGNOSIS — Z8701 Personal history of pneumonia (recurrent): Secondary | ICD-10-CM | POA: Insufficient documentation

## 2013-12-29 DIAGNOSIS — I2510 Atherosclerotic heart disease of native coronary artery without angina pectoris: Secondary | ICD-10-CM | POA: Insufficient documentation

## 2013-12-29 DIAGNOSIS — K219 Gastro-esophageal reflux disease without esophagitis: Secondary | ICD-10-CM | POA: Insufficient documentation

## 2013-12-29 DIAGNOSIS — E871 Hypo-osmolality and hyponatremia: Secondary | ICD-10-CM | POA: Diagnosis not present

## 2013-12-29 DIAGNOSIS — H409 Unspecified glaucoma: Secondary | ICD-10-CM | POA: Insufficient documentation

## 2013-12-29 DIAGNOSIS — Z853 Personal history of malignant neoplasm of breast: Secondary | ICD-10-CM | POA: Insufficient documentation

## 2013-12-29 DIAGNOSIS — J45909 Unspecified asthma, uncomplicated: Secondary | ICD-10-CM | POA: Diagnosis not present

## 2013-12-29 DIAGNOSIS — Z7952 Long term (current) use of systemic steroids: Secondary | ICD-10-CM | POA: Insufficient documentation

## 2013-12-29 DIAGNOSIS — Z791 Long term (current) use of non-steroidal anti-inflammatories (NSAID): Secondary | ICD-10-CM | POA: Diagnosis not present

## 2013-12-29 DIAGNOSIS — Z792 Long term (current) use of antibiotics: Secondary | ICD-10-CM | POA: Insufficient documentation

## 2013-12-29 DIAGNOSIS — J069 Acute upper respiratory infection, unspecified: Secondary | ICD-10-CM | POA: Diagnosis not present

## 2013-12-29 DIAGNOSIS — Z8742 Personal history of other diseases of the female genital tract: Secondary | ICD-10-CM | POA: Diagnosis not present

## 2013-12-29 DIAGNOSIS — Z7982 Long term (current) use of aspirin: Secondary | ICD-10-CM | POA: Insufficient documentation

## 2013-12-29 LAB — URINALYSIS, ROUTINE W REFLEX MICROSCOPIC
BILIRUBIN URINE: NEGATIVE
Glucose, UA: NEGATIVE mg/dL
Hgb urine dipstick: NEGATIVE
KETONES UR: NEGATIVE mg/dL
Leukocytes, UA: NEGATIVE
NITRITE: NEGATIVE
Protein, ur: NEGATIVE mg/dL
SPECIFIC GRAVITY, URINE: 1.003 — AB (ref 1.005–1.030)
Urobilinogen, UA: 0.2 mg/dL (ref 0.0–1.0)
pH: 7 (ref 5.0–8.0)

## 2013-12-29 LAB — BASIC METABOLIC PANEL
Anion gap: 12 (ref 5–15)
BUN: 12 mg/dL (ref 6–23)
CALCIUM: 10 mg/dL (ref 8.4–10.5)
CO2: 28 mEq/L (ref 19–32)
Chloride: 88 mEq/L — ABNORMAL LOW (ref 96–112)
Creatinine, Ser: 0.6 mg/dL (ref 0.50–1.10)
GFR calc Af Amer: >90 mL/min (ref >90)
GFR, EST NON AFRICAN AMERICAN: 86 mL/min — AB (ref >90)
Glucose, Bld: 105 mg/dL — ABNORMAL HIGH (ref 70–99)
POTASSIUM: 4.8 meq/L (ref 3.7–5.3)
SODIUM: 128 meq/L — AB (ref 137–147)

## 2013-12-29 LAB — CBC WITH DIFFERENTIAL/PLATELET
BASOS ABS: 0 10*3/uL (ref 0.0–0.1)
Basophils Relative: 1 % (ref 0–1)
EOS PCT: 2 % (ref 0–5)
Eosinophils Absolute: 0.2 10*3/uL (ref 0.0–0.7)
HCT: 38.9 % (ref 36.0–46.0)
Hemoglobin: 13.8 g/dL (ref 12.0–15.0)
LYMPHS PCT: 35 % (ref 12–46)
Lymphs Abs: 2.8 10*3/uL (ref 0.7–4.0)
MCH: 30.7 pg (ref 26.0–34.0)
MCHC: 35.5 g/dL (ref 30.0–36.0)
MCV: 86.6 fL (ref 78.0–100.0)
Monocytes Absolute: 0.7 10*3/uL (ref 0.1–1.0)
Monocytes Relative: 9 % (ref 3–12)
NEUTROS PCT: 53 % (ref 43–77)
Neutro Abs: 4.3 10*3/uL (ref 1.7–7.7)
PLATELETS: 338 10*3/uL (ref 150–400)
RBC: 4.49 MIL/uL (ref 3.87–5.11)
RDW: 13.9 % (ref 11.5–15.5)
WBC: 8 10*3/uL (ref 4.0–10.5)

## 2013-12-29 LAB — TROPONIN I

## 2013-12-29 MED ORDER — METOPROLOL TARTRATE 50 MG PO TABS
50.0000 mg | ORAL_TABLET | Freq: Once | ORAL | Status: DC
  Filled 2013-12-29: qty 1

## 2013-12-29 MED ORDER — LOSARTAN POTASSIUM 50 MG PO TABS
50.0000 mg | ORAL_TABLET | Freq: Every day | ORAL | Status: DC
  Filled 2013-12-29: qty 1

## 2013-12-29 MED ORDER — HYDRALAZINE HCL 20 MG/ML IJ SOLN
5.0000 mg | Freq: Once | INTRAMUSCULAR | Status: AC
  Administered 2013-12-29: 5 mg via INTRAVENOUS
  Filled 2013-12-29: qty 1

## 2013-12-29 NOTE — ED Notes (Signed)
MD at bedside. 

## 2013-12-29 NOTE — ED Notes (Signed)
triage delay due to pt need to use restroom

## 2013-12-29 NOTE — ED Provider Notes (Signed)
TIME SEEN: 12:50 PM  CHIEF COMPLAINT: hypertension  HPI: Pt is a 78 y.o. F with history of hypertension, hyperlipidemia, coronary artery disease, fibromyalgia, chronic headaches who was recently admitted to the hospital for hypertensive emergency and hyponatremia who presents emergency department today with hypertension. She reports that she was recently switched to losartan and Lopressor from hydrochlorothiazide. She took her Lopressor this morning but did not take her losartan yet today. She states she has had a dry cough for several weeks and has been intermittently on Levaquin. She states she took a dose of Levaquin that was prescribed by her PCP today and it made her feel poorly so she measured her blood pressure and noticed it was elevated. She states she did have some cramping in her right arm which is now gone but no chest pain or shortness of breath, no headache or vision changes, no numbness, tingling or focal weakness. No vomiting or diarrhea.  She reports this is the second time she has not taken Levaquin and had strange effects from it.  No rash, swelling of her lips or tongue, difficulty breathing, wheezing.  ROS: See HPI Constitutional: no fever  Eyes: no drainage  ENT: no runny nose   Cardiovascular:  no chest pain  Resp: no SOB  GI: no vomiting GU: no dysuria Integumentary: no rash  Allergy: no hives  Musculoskeletal: no leg swelling  Neurological: no slurred speech ROS otherwise negative  PAST MEDICAL HISTORY/PAST SURGICAL HISTORY:  Past Medical History  Diagnosis Date  . Unspecified glaucoma   . Allergic rhinitis, cause unspecified   . Unspecified asthma(493.90)   . Unspecified essential hypertension   . Other and unspecified hyperlipidemia   . Gastritis   . Abdominal pain, left lower quadrant   . Diverticulosis of colon (without mention of hemorrhage)   . Irritable bowel syndrome   . Colonic polyp 04-27-2009    tubular adenoma  . Headache(784.0)   . Dizziness   .  Breast cancer, Left 12/20/2010  . Gallstones   . CAD (coronary artery disease)     a. 01/19/2010 s/p CABG x 3, lima->lad, vg->diag, vg->om1;  b. 06/2011 :Lexi MV: EF 84%, No ischemia.  . Bronchitis     hx of  . Pneumonia   . Hiatal hernia   . Fibromyalgia   . Esophageal reflux     hiatal hernia  . Atrophic vaginitis   . GERD (gastroesophageal reflux disease)   . Hypercholesterolemia   . Breast cancer   . Glaucoma   . Cervical arthritis     MEDICATIONS:  Prior to Admission medications   Medication Sig Start Date End Date Taking? Authorizing Provider  aspirin EC 81 MG tablet Take 81 mg by mouth daily.    Historical Provider, MD  benzonatate (TESSALON) 200 MG capsule Take 1 capsule (200 mg total) by mouth 3 (three) times daily as needed for cough. 12/27/13   Samella Parr, NP  bifidobacterium infantis (ALIGN) capsule Take 1 capsule by mouth daily.    Historical Provider, MD  Diphenhyd-Hydrocort-Nystatin (FIRST-DUKES MOUTHWASH) SUSP Use as directed 5 mLs in the mouth or throat 4 (four) times daily as needed (mouth pain). Gargle and Swallow    Historical Provider, MD  fexofenadine (ALLEGRA) 180 MG tablet Take 180 mg by mouth daily.      Historical Provider, MD  fluocinonide (LIDEX) 0.05 % external solution Apply 1 application topically daily as needed (scalp itching).  04/29/13   Historical Provider, MD  Glucosamine-Chondroit-Vit C-Mn (GLUCOSAMINE CHONDR 1500  COMPLX PO) Take 1 tablet by mouth 3 (three) times daily.      Historical Provider, MD  guaiFENesin (MUCINEX) 600 MG 12 hr tablet Take 1,200 mg by mouth 2 (two) times daily.    Historical Provider, MD  hyoscyamine (ANASPAZ) 0.125 MG TBDP disintergrating tablet Place 0.125 mg under the tongue every 6 (six) hours as needed for bladder spasms or cramping.    Historical Provider, MD  ibuprofen (ADVIL,MOTRIN) 200 MG tablet Take 200 mg by mouth daily as needed (pain).    Historical Provider, MD  isosorbide mononitrate (IMDUR) 30 MG 24 hr  tablet Take 1 tablet (30 mg total) by mouth daily. 12/13/13   Josue Hector, MD  ketoconazole (NIZORAL) 2 % shampoo Apply 1 application topically 2 (two) times a week. As needed for itching a couple of times week 04/29/13   Historical Provider, MD  latanoprost (XALATAN) 0.005 % ophthalmic solution Place 1 drop into both eyes at bedtime.  07/31/12   Historical Provider, MD  letrozole (FEMARA) 2.5 MG tablet Take 1 tablet (2.5 mg total) by mouth daily. 07/27/13   Amy Milda Smart, PA-C  levofloxacin (LEVAQUIN) 500 MG tablet Take 1 tablet (500 mg total) by mouth daily. 12/21/13   Noralee Space, MD  losartan (COZAAR) 50 MG tablet Take 1 tablet (50 mg total) by mouth daily. 12/27/13   Samella Parr, NP  metoprolol (LOPRESSOR) 50 MG tablet Take 1 tablet (50 mg total) by mouth 2 (two) times daily. 09/27/13   Josue Hector, MD  mometasone (NASONEX) 50 MCG/ACT nasal spray Place 2 sprays into the nose daily as needed (congestion).     Historical Provider, MD  nitroGLYCERIN (NITROSTAT) 0.4 MG SL tablet Place 0.4 mg under the tongue every 5 (five) minutes as needed for chest pain.    Historical Provider, MD  Omega-3 Fatty Acids (FISH OIL) 1200 MG CAPS Take 1,200 mg by mouth daily.     Historical Provider, MD  omeprazole (PRILOSEC) 40 MG capsule Take 40 mg by mouth daily. 30 minutes before 1st meal of the day    Historical Provider, MD  tiZANidine (ZANAFLEX) 2 MG tablet Take 2 mg by mouth at bedtime.    Historical Provider, MD  vitamin B-12 (CYANOCOBALAMIN) 1000 MCG tablet Take 1,000 mcg by mouth daily.      Historical Provider, MD    ALLERGIES:  Allergies  Allergen Reactions  . Choline Fenofibrate Other (See Comments)     pt states INTOL to Trilipix w/ "thigh burning"  . Simvastatin Other (See Comments)     pt states INTOL to STATINS \\T \ refuses to restart  . Hydrocodone     Nightmare after taking cough syrup w/hydrocodone  . Adhesive [Tape] Rash  . Ceclor [Cefaclor] Rash  . Clarithromycin Rash  . Codeine Nausea  Only  . Doxycycline Rash  . Lisinopril Cough    Developed ACE cough...  . Penicillins Itching and Rash    At injection site  . Tobramycin-Dexamethasone Rash    SOCIAL HISTORY:  History  Substance Use Topics  . Smoking status: Never Smoker   . Smokeless tobacco: Never Used  . Alcohol Use: No    FAMILY HISTORY: Family History  Problem Relation Age of Onset  . Heart disease Brother   . Cancer Brother     gland cancer  . Diabetes Sister   . Breast cancer Sister   . Stroke Mother   . Stroke Father   . Colon cancer Neg Hx   .  Stomach cancer Neg Hx   . Pancreatic cancer Neg Hx   . Prostate cancer Father   . Kidney disease Neg Hx   . Liver disease Neg Hx     EXAM: BP 212/87 mmHg  Pulse 69  Temp(Src) 97.9 F (36.6 C) (Oral)  Resp 22  SpO2 99%  LMP 11/26/1990 CONSTITUTIONAL: Alert and oriented and responds appropriately to questions. Well-appearing; well-nourished HEAD: Normocephalic EYES: Conjunctivae clear, PERRL ENT: normal nose; no rhinorrhea; moist mucous membranes; pharynx without lesions noted NECK: Supple, no meningismus, no LAD  CARD: RRR; S1 and S2 appreciated; no murmurs, no clicks, no rubs, no gallops RESP: Normal chest excursion without splinting or tachypnea; breath sounds clear and equal bilaterally; no wheezes, no rhonchi, no rales, no hypoxia or respiratory distress, speaking full sentences ABD/GI: Normal bowel sounds; non-distended; soft, non-tender, no rebound, no guarding BACK:  The back appears normal and is non-tender to palpation, there is no CVA tenderness EXT: Normal ROM in all joints; non-tender to palpation; no edema; normal capillary refill; no cyanosis    SKIN: Normal color for age and race; warm NEURO: Moves all extremities equally; sensation to light touch intact diffusely, cranial nerves II through XII intact, strength 5/5 in all 4 extremities, normal gait PSYCH: The patient's mood and manner are appropriate. Grooming and personal hygiene  are appropriate.  MEDICAL DECISION MAKING: Patient here with asymptomatic hypertension. She did not take her losartan prior to arriving. She does have a dry cough but has had multiple prior x-rays which show no infiltrate her lungs are clear to auscultation. She is afebrile and not hypoxic. She was recently admitted to the hospital however for hypertensive emergency and hyponatremia. Because of this, I will repeat labs, urine, EKG. Will give small dose of IV hydralazine as we do not have losartan here this ED for her hypertension. Will closely monitor.  ED PROGRESS: Patient's blood pressure has improved with IV hydralazine. Her labs do show mild decrease of her sodium. She was discharged on 12/27/13 with a sodium of 134. Have discussed with patient that she should restrict her fluid intake as her sodium today is 128 with a low specific gravity. She reports she has been drinking more fluids today because she was trying to "flush the Levaquin out of my system". Otherwise labs and urine do not show any signs of end organ damage.  Have advised her to follow-up with her PCP in the next 2 days of that someone could reassess her lungs, blood pressure and sodium. Have discussed strict return precautions. I do not feel at this time she meets criteria for inpatient admission or observation. She agrees and wants discharge home. Husband at bedside.      EKG Interpretation  Date/Time:  Wednesday December 29 2013 14:00:37 EST Ventricular Rate:  69 PR Interval:  176 QRS Duration: 78 QT Interval:  406 QTC Calculation: 435 R Axis:   3 Text Interpretation:  Normal sinus rhythm Nonspecific ST abnormality Abnormal ECG Confirmed by Ogle Hoeffner,  DO, Gethsemane Fischler ([id number]) on 12/29/2013 2:11:33 PM        [place], DO 12/29/13 [id number]

## 2013-12-29 NOTE — Discharge Instructions (Signed)
Your sodium level today was slightly low again at 128. It was 134 on 12/27/13. I recommend that you decrease the amount of water that you are drinking and follow up with your doctor in the next 2 days to have this rechecked. Your blood pressure was also elevated which may be secondary to you not feeling well from your upper respiratory infection but also from a recent change in your blood pressure medications. Please take your Cozaar when you get home. Please stop taking Levaquin.    Hypertension Hypertension, commonly called high blood pressure, is when the force of blood pumping through your arteries is too strong. Your arteries are the blood vessels that carry blood from your heart throughout your body. A blood pressure reading consists of a higher number over a lower number, such as 110/72. The higher number (systolic) is the pressure inside your arteries when your heart pumps. The lower number (diastolic) is the pressure inside your arteries when your heart relaxes. Ideally you want your blood pressure below 120/80. Hypertension forces your heart to work harder to pump blood. Your arteries may become narrow or stiff. Having hypertension puts you at risk for heart disease, stroke, and other problems.  RISK FACTORS Some risk factors for high blood pressure are controllable. Others are not.  Risk factors you cannot control include:   Race. You may be at higher risk if you are African American.  Age. Risk increases with age.  Gender. Men are at higher risk than women before age 61 years. After age 40, women are at higher risk than men. Risk factors you can control include:  Not getting enough exercise or physical activity.  Being overweight.  Getting too much fat, sugar, calories, or salt in your diet.  Drinking too much alcohol. SIGNS AND SYMPTOMS Hypertension does not usually cause signs or symptoms. Extremely high blood pressure (hypertensive crisis) may cause headache, anxiety,  shortness of breath, and nosebleed. DIAGNOSIS  To check if you have hypertension, your health care provider will measure your blood pressure while you are seated, with your arm held at the level of your heart. It should be measured at least twice using the same arm. Certain conditions can cause a difference in blood pressure between your right and left arms. A blood pressure reading that is higher than normal on one occasion does not mean that you need treatment. If one blood pressure reading is high, ask your health care provider about having it checked again. TREATMENT  Treating high blood pressure includes making lifestyle changes and possibly taking medicine. Living a healthy lifestyle can help lower high blood pressure. You may need to change some of your habits. Lifestyle changes may include:  Following the DASH diet. This diet is high in fruits, vegetables, and whole grains. It is low in salt, red meat, and added sugars.  Getting at least 2 hours of brisk physical activity every week.  Losing weight if necessary.  Not smoking.  Limiting alcoholic beverages.  Learning ways to reduce stress. If lifestyle changes are not enough to get your blood pressure under control, your health care provider may prescribe medicine. You may need to take more than one. Work closely with your health care provider to understand the risks and benefits. HOME CARE INSTRUCTIONS  Have your blood pressure rechecked as directed by your health care provider.   Take medicines only as directed by your health care provider. Follow the directions carefully. Blood pressure medicines must be taken as prescribed. The medicine  does not work as well when you skip doses. Skipping doses also puts you at risk for problems.   Do not smoke.   Monitor your blood pressure at home as directed by your health care provider. SEEK MEDICAL CARE IF:   You think you are having a reaction to medicines taken.  You have  recurrent headaches or feel dizzy.  You have swelling in your ankles.  You have trouble with your vision. SEEK IMMEDIATE MEDICAL CARE IF:  You develop a severe headache or confusion.  You have unusual weakness, numbness, or feel faint.  You have severe chest or abdominal pain.  You vomit repeatedly.  You have trouble breathing. MAKE SURE YOU:   Understand these instructions.  Will watch your condition.  Will get help right away if you are not doing well or get worse. Document Released: 02/11/2005 Document Revised: 06/28/2013 Document Reviewed: 12/04/2012 [facility] Patient Information 2015 [place], [place]. This information is not intended to replace advice given to you by your health care provider. Make sure you discuss any questions you have with your health care provider.   Upper Respiratory Infection, Adult An upper respiratory infection (URI) is also sometimes known as the common cold. The upper respiratory tract includes the nose, sinuses, throat, trachea, and bronchi. Bronchi are the airways leading to the lungs. Most people improve within 1 week, but symptoms can last up to 2 weeks. A residual cough may last even longer.  CAUSES Many different viruses can infect the tissues lining the upper respiratory tract. The tissues become irritated and inflamed and often become very moist. Mucus production is also common. A cold is contagious. You can easily spread the virus to others by oral contact. This includes kissing, sharing a glass, coughing, or sneezing. Touching your mouth or nose and then touching a surface, which is then touched by another person, can also spread the virus. SYMPTOMS  Symptoms typically develop 1 to 3 days after you come in contact with a cold virus. Symptoms vary from person to person. They may include:  Runny nose.  Sneezing.  Nasal congestion.  Sinus irritation.  Sore throat.  Loss of voice (laryngitis).  Cough.  Fatigue.  Muscle  aches.  Loss of appetite.  Headache.  Low-grade fever. DIAGNOSIS  You might diagnose your own cold based on familiar symptoms, since most people get a cold 2 to 3 times a year. Your caregiver can confirm this based on your exam. Most importantly, your caregiver can check that your symptoms are not due to another disease such as strep throat, sinusitis, pneumonia, asthma, or epiglottitis. Blood tests, throat tests, and X-rays are not necessary to diagnose a common cold, but they may sometimes be helpful in excluding other more serious diseases. Your caregiver will decide if any further tests are required. RISKS AND COMPLICATIONS  You may be at risk for a more severe case of the common cold if you smoke cigarettes, have chronic heart disease (such as heart failure) or lung disease (such as asthma), or if you have a weakened immune system. The very young and very old are also at risk for more serious infections. Bacterial sinusitis, middle ear infections, and bacterial pneumonia can complicate the common cold. The common cold can worsen asthma and chronic obstructive pulmonary disease (COPD). Sometimes, these complications can require emergency medical care and may be life-threatening. PREVENTION  The best way to protect against getting a cold is to practice good hygiene. Avoid oral or hand contact with people with cold  symptoms. Wash your hands often if contact occurs. There is no clear evidence that vitamin C, vitamin E, echinacea, or exercise reduces the chance of developing a cold. However, it is always recommended to get plenty of rest and practice good nutrition. TREATMENT  Treatment is directed at relieving symptoms. There is no cure. Antibiotics are not effective, because the infection is caused by a virus, not by bacteria. Treatment may include:  Increased fluid intake. Sports drinks offer valuable electrolytes, sugars, and fluids.  Breathing heated mist or steam (vaporizer or  shower).  Eating chicken soup or other clear broths, and maintaining good nutrition.  Getting plenty of rest.  Using gargles or lozenges for comfort.  Controlling fevers with ibuprofen or acetaminophen as directed by your caregiver.  Increasing usage of your inhaler if you have asthma. Zinc gel and zinc lozenges, taken in the first 24 hours of the common cold, can shorten the duration and lessen the severity of symptoms. Pain medicines may help with fever, muscle aches, and throat pain. A variety of non-prescription medicines are available to treat congestion and runny nose. Your caregiver can make recommendations and may suggest nasal or lung inhalers for other symptoms.  HOME CARE INSTRUCTIONS   Only take over-the-counter or prescription medicines for pain, discomfort, or fever as directed by your caregiver.  Use a warm mist humidifier or inhale steam from a shower to increase air moisture. This may keep secretions moist and make it easier to breathe.  Drink enough water and fluids to keep your urine clear or pale yellow.  Rest as needed.  Return to work when your temperature has returned to normal or as your caregiver advises. You may need to stay home longer to avoid infecting others. You can also use a face mask and careful hand washing to prevent spread of the virus. SEEK MEDICAL CARE IF:   After the first few days, you feel you are getting worse rather than better.  You need your caregiver's advice about medicines to control symptoms.  You develop chills, worsening shortness of breath, or brown or red sputum. These may be signs of pneumonia.  You develop yellow or brown nasal discharge or pain in the face, especially when you bend forward. These may be signs of sinusitis.  You develop a fever, swollen neck glands, pain with swallowing, or white areas in the back of your throat. These may be signs of strep throat. SEEK IMMEDIATE MEDICAL CARE IF:   You have a fever.  You  develop severe or persistent headache, ear pain, sinus pain, or chest pain.  You develop wheezing, a prolonged cough, cough up blood, or have a change in your usual mucus (if you have chronic lung disease).  You develop sore muscles or a stiff neck. Document Released: 08/07/2000 Document Revised: 05/06/2011 Document Reviewed: 05/19/2013 [facility] Patient Information 2015 [place], [place]. This information is not intended to replace advice given to you by your health care provider. Make sure you discuss any questions you have with your health care provider.    Hyponatremia  Hyponatremia is when the amount of salt (sodium) in your blood is too low. When sodium levels are low, your cells will absorb extra water and swell. The swelling happens throughout the body, but it mostly affects the brain. Severe brain swelling (cerebral edema), seizures, or coma can happen.  CAUSES   Heart, kidney, or liver problems.  Thyroid problems.  Adrenal gland problems.  Severe vomiting and diarrhea.  Certain medicines or illegal drugs.  Dehydration.  Drinking too much water.  Low-sodium diet. SYMPTOMS   Nausea and vomiting.  Confusion.  Lethargy.  Agitation.  Headache.  Twitching or shaking (seizures).  Unconsciousness.  Appetite loss.  Muscle weakness and cramping. DIAGNOSIS  Hyponatremia is identified by a simple blood test. Your caregiver will perform a history and physical exam to try to find the cause and type of hyponatremia. Other tests may be needed to measure the amount of sodium in your blood and urine. TREATMENT  Treatment will depend on the cause.   Fluids may be given through the vein (IV).  Medicines may be used to correct the sodium imbalance. If medicines are causing the problem, they will need to be adjusted.  Water or fluid intake may be restricted to restore proper balance. The speed of correcting the sodium problem is very important. If the problem is corrected  too fast, nerve damage (sometimes unchangeable) can happen. HOME CARE INSTRUCTIONS   Only take medicines as directed by your caregiver. Many medicines can make hyponatremia worse. Discuss all your medicines with your caregiver.  Carefully follow any recommended diet, including any fluid restrictions.  You may be asked to repeat lab tests. Follow these directions.  Avoid alcohol and recreational drugs. SEEK MEDICAL CARE IF:   You develop worsening nausea, fatigue, headache, confusion, or weakness.  Your original hyponatremia symptoms return.  You have problems following the recommended diet. SEEK IMMEDIATE MEDICAL CARE IF:   You have a seizure.  You faint.  You have ongoing diarrhea or vomiting. MAKE SURE YOU:   Understand these instructions.  Will watch your condition.  Will get help right away if you are not doing well or get worse. Document Released: 02/01/2002 Document Revised: 05/06/2011 Document Reviewed: 07/29/2010 [facility] Patient Information 2015 [place], [place]. This information is not intended to replace advice given to you by your health care provider. Make sure you discuss any questions you have with your health care provider.

## 2013-12-29 NOTE — Telephone Encounter (Signed)
Called and spoke with pt and she stated that she already has appt with SN on 11/5 at 10.  Nothing further is needed.

## 2013-12-29 NOTE — ED Notes (Signed)
Reports HTN since this AM when she took her medication. Reports recent history of admission and same.

## 2013-12-30 ENCOUNTER — Other Ambulatory Visit: Payer: Self-pay

## 2013-12-30 ENCOUNTER — Ambulatory Visit (INDEPENDENT_AMBULATORY_CARE_PROVIDER_SITE_OTHER): Payer: Medicare Other | Admitting: Pulmonary Disease

## 2013-12-30 ENCOUNTER — Encounter: Payer: Self-pay | Admitting: Pulmonary Disease

## 2013-12-30 VITALS — BP 146/82 | HR 74 | Temp 97.4°F | Ht 60.0 in | Wt 134.6 lb

## 2013-12-30 DIAGNOSIS — K5730 Diverticulosis of large intestine without perforation or abscess without bleeding: Secondary | ICD-10-CM | POA: Diagnosis not present

## 2013-12-30 DIAGNOSIS — J440 Chronic obstructive pulmonary disease with acute lower respiratory infection: Secondary | ICD-10-CM

## 2013-12-30 DIAGNOSIS — E782 Mixed hyperlipidemia: Secondary | ICD-10-CM | POA: Diagnosis not present

## 2013-12-30 DIAGNOSIS — M609 Myositis, unspecified: Secondary | ICD-10-CM

## 2013-12-30 DIAGNOSIS — J441 Chronic obstructive pulmonary disease with (acute) exacerbation: Secondary | ICD-10-CM

## 2013-12-30 DIAGNOSIS — I2510 Atherosclerotic heart disease of native coronary artery without angina pectoris: Secondary | ICD-10-CM | POA: Diagnosis not present

## 2013-12-30 DIAGNOSIS — J449 Chronic obstructive pulmonary disease, unspecified: Secondary | ICD-10-CM

## 2013-12-30 DIAGNOSIS — K219 Gastro-esophageal reflux disease without esophagitis: Secondary | ICD-10-CM | POA: Diagnosis not present

## 2013-12-30 DIAGNOSIS — M150 Primary generalized (osteo)arthritis: Secondary | ICD-10-CM

## 2013-12-30 DIAGNOSIS — K589 Irritable bowel syndrome without diarrhea: Secondary | ICD-10-CM | POA: Diagnosis not present

## 2013-12-30 DIAGNOSIS — I10 Essential (primary) hypertension: Secondary | ICD-10-CM

## 2013-12-30 DIAGNOSIS — C50511 Malignant neoplasm of lower-outer quadrant of right female breast: Secondary | ICD-10-CM

## 2013-12-30 DIAGNOSIS — M791 Myalgia: Secondary | ICD-10-CM

## 2013-12-30 DIAGNOSIS — IMO0001 Reserved for inherently not codable concepts without codable children: Secondary | ICD-10-CM

## 2013-12-30 DIAGNOSIS — M159 Polyosteoarthritis, unspecified: Secondary | ICD-10-CM

## 2013-12-30 MED ORDER — HYDRALAZINE HCL 50 MG PO TABS: ORAL_TABLET | ORAL | Status: DC

## 2013-12-30 MED ORDER — METOPROLOL TARTRATE 50 MG PO TABS: 50.0000 mg | ORAL_TABLET | Freq: Two times a day (BID) | ORAL | Status: DC

## 2013-12-30 MED ORDER — OMEPRAZOLE 40 MG PO CPDR: 40.0000 mg | DELAYED_RELEASE_CAPSULE | Freq: Every day | ORAL | Status: DC

## 2013-12-30 MED ORDER — LOSARTAN POTASSIUM 100 MG PO TABS: ORAL_TABLET | ORAL | Status: DC

## 2013-12-30 MED ORDER — TRAMADOL HCL 50 MG PO TABS: 50.0000 mg | ORAL_TABLET | Freq: Three times a day (TID) | ORAL | Status: DC | PRN

## 2013-12-30 NOTE — Patient Instructions (Signed)
Today we updated your med list in our EPIC system...     Today we made the following adjustments in your meds>>    Continue the Metoprolol 50mg  twice daily...    We changed the Losartan to the 100mg  tab- take 1/2 tab twice daily...    We added HYDRALAZINE 50mg  tabs- take 1/2 tab twice daily...  We refilled your Tramadol for pain (take it w/ Tylenol for more severe discomfort)...  Call for any questions...  Let's plan a follow up visit in 2 weeks w/ blood work.Marland KitchenMarland Kitchen

## 2013-12-30 NOTE — Progress Notes (Signed)
Subjective:    Patient ID: [name], female    DOB: [birth year], 78 y.o.   MRN: [id number]  HPI 78 y/o WF here for a follow up visit... she has multiple medical problems including AR & Asthma;  HBP;  CAD followed by [name] & s/p 3 vessel CABG 11/11 by [name];  Hyperlipidemia followed in the [facility];  GERD/ IBS/ Divertics;  Breast Cancer diagnosed 12/12;  Fibromyalgia;  Chr HAs & dizziness...  SEE PREV EPIC NOTES FOR OLDER DATA >>   ~  March 31, 2012:  22mo ROV & [name] admits that she's not feeling well today- c/o occipHA w/ band-like extension around her head, feels funny- sl dizzy & light headed, occuring daily; she has Tramadol for prn use but she says she doesn't like taking it- prefers Midrin (refilled), offered Neuro/HA referral & she will consider it...     AR/ Asthma>  She stopped the Symbicort as well & not on any inhaled meds at present; denies cough, sput, ch in dyspnea, etc.    HBP>  controlled on Metop50Bid & Hyzaar100-12.5 w/ BP=144/72 today; denies visual changes, CP, palipit, dizziness, syncope, dyspnea, edema, etc...     CAD>  CP 11/11=> cath w/ in-stent restenosis, then CABG x3 by [name]; re-hospitalized 6/12 by Cards for CP> cath showed severe 3 vessel CAD w/ 3/3 grafts patent & norm LVF w/ EF= 65-70%; Myoview 5/13 showed no ischemia & EF>80%; no change in meds- on Imdur30; & [name] noted that med rx is lim by side effects.    Hyperlipid>  followed in the [place] reviewed on Cres10- 2d per week but she was unable to tolerate even this low dose; off all meds on diet alone...    GI- GERD, Divertics> on Omep40; followed by [name] & seen 9/13 f/u for ?diverticulitis- improved after Cipro/Flagyl but CTAbd was neg x diverticulosis; he rec high fiber diet & Citrucel...    Hx left breast cancer> she had f/u Oncology 12/13- back on Femara & tol ok; last mammogram 10/13 was ok as well...    Others>  she has persistant FM symptoms and chr HAs, uses Vicodin Prn... We  reviewed prob list, meds, xrays and labs> see below for updates >> she had the 2013 Flu vaccine 10/13...   EKG 9/13 showed NSR, rate64, NSSTTWA, NAD...   CXR 10/13 showed heart at upper lim of norm, prior CABG, mild hyperinflation, clear, NAD...  Abd Ultrasound 10/13 by [name] for eval CP showed> norm GB, liver, kidneys, & Ao- neg sonar...  LABS 10-12/13 showed Chems- wnl;  CBC- wnl  LABS 2/14:  FLP- ok x LDL=110;  TSH=1.33   ~  Jul 03, 2012:  28mo ROV & post hospital visit> [facility] 4/22-23/14 after she presented w/ a pill (B-complex vit) aspiration and wheezing, SOB, asthma exac; she was able to cough it all out & did not require bronchoscopy; she denied dysphagia, n/v, etc;  Still not feeling well, min residual cough, notes some neck pain (XRay showed Cspine spondylosis) & she wants MRI=> referral to NS or whatever is necessary; we reviewed Rx w/ Tramadol, Robaxin as well... Her CC today sounds like reactive hypoglycemia & we reviewed diet adjustment- low glycemic index foods, more freq small meals if nec, etc...     We reviewed prob list, meds, xrays and labs> see below for updates >>   CXR 4/14 showed norm heart size, s/p CABG, tort thor Ao, bilat apical pleural scarring, NAD.Marland KitchenMarland Kitchen  ~  September 29, 2012:  109mo ROV & [name] had her MRI CSpine 5/14> multilevel facet hypertrophy, foraminal narrowing & stenosis, disc osteophyte complexes, some central canal stenosis=> she was sent to NS, [name] who rec Mobic & PT (helped alittle) but she was not pleased and is requesting a Rheumatology 2nd opinion about her neck discomfort; notes that insurance won't pay for musc relaxers...     She saw [name] for Cards 5/14> CAD, s/p CABG 2011, hx in-stent restenosis, unable to tol statins (even low dose intermittent rx); Myoview 5/13 was neg- no ischemia & EF>80%; no change in med rx...    She had f/u [name] 6/14> his note is reviewed- s/p lumpectomy, sentinel node, XRT; plan is to continue Femara2.5mg  thru 3/16 for  59yrs rx    She had GYN f/u 6/14 w/ [name]> doing satis x some dryness... We reviewed prob list, meds, xrays and labs> see below for updates >>   ~  February 03, 2013:  67mo ROV & [name] had Rheum consult [name]- last seen 9/14 & note reviewed> neck pain, this exac HAs/migraines, dizzy/ light headed; MRI=> NS consult & not a surg cand & NSAIDs/ PT w/ min benefit; saw Neurology- tried [name] but feels hung-over; for her DDD & OA they rec Flex5mg Tid which has helped some & she requests refill today- OK...     She saw [name] 9/14 after ER visit for HA> hx migraines and mixed HAs; CT Brain was neg & MRI CSpine showed multilevel DDD & some foraminal stenosis; Labs were neg including CRP & Sed; they tried Nortrip & Mobic but only min benefit...    She had routine f/u [name] 10/14> Hx left breast cancer dx 10/12 & treated w/ lumpectomy, XRT, & now Femara; exam neg, f/u mammogram neg, rec f/u 44yr... We reviewed prob list, meds, xrays and labs> see below for updates >> BP remains well controlled on her 3 meds; she had the 2014 flu vaccine 10/14; she continues on allergy shots at the [facility]...  ~  August 11, 2013:  79mo ROV & [name] reports that she is stable overall just lack energy so she's eating extra protein to help w/ this she tells me... She has had numerous specialty follow up visits in the interval>>    She had an allergy f/u w/ [name] 3/15> AR on allergy shots, Allegra, Flonase, Saline; she is stable w/o recent exacerbation...    Hx Asthma on XopenexHFA as needed; she has done well w/o asthma exac, cough, sput, wheezing, or SOB...     She had a Cards f/u w/ [name] 1/15> HBP, CAD, HL; on ASA81, Metop50Bid, Hyzaar100-12.5, Imdur30; Hx neg Myoview 5/13 & doing satis- no changes made; BP today= 136/68 & she denies CP, palpit, SOB, dizzy, edema, etc...    Lipids controlled on diet alone (w/ FishOil & CoQ10); Last FLP was 2/14 showing TChol 194, TG 143, HDL 55, LDL 110; reminded of diet, exercise, &  need for Fasting blood work...    She has seen DrHawkes for rheum and [name] for NS in the past> told to f/u prn only...    She had a Neuro f/u 1/15> HAs followed by [name]; neg CTBrain & DDD on MRI Cspine; she refused to take [name] & uses Tramadol/ Flexeril as needed; no change in meds needed...     She saw Oncology 6/15 for f/u of her left Breast Cancer> on Femara2.5 & tol well, s/p lumpectomy & sentinel node bx 12/12, followed by XRT finished 3/13, & on Letrozole since then,  no known recurrence...  We reviewed prob list, meds, xrays and labs> see below for updates >>   ~  December 21, 2013:  25mo ROV & add-on requested for bronchitic exacerbation> [name] tells me that she noted the onset of cough, chest congestion, hoarse voice 7 min sore throat ~4d ago; she denied f/c/s but has chest soreness from the coughing 7 bringing up sm amt of yellow-green sput; of note she was recently (3wks ago) treated for a UTI (Proteus sens Cipro) & this resolved... Exam shows borderline BP= 160/78, otherw stable VS w/ O2sat95% on RA, sl pharyngeal erythema, no exudates, ears neg, no adenopathy, hoarse voice, chest exam w/ bilat rhonchi & exp wheezing... CXR shows norm heart size, s/pCABG, clear lungs w/o infiltrates... We discussed Rx w/ Depo80, Levaquin500 x7d, Mucinex 600-2Bid, Fluids, MMW & Delsym prn...     We reviewed prob list, meds, xrays and labs> see below for updates >>   CXR 10/15 showed normal heart size, s/p CABG, unchanged biapical pleural scarring, otherw clear lungs w/o infiltrates, NAD...   ~  December 30, 2013:  1wk ROV & post hosp check>  [name] was [facility] 10/29 - 12/27/13 by [facility] w/ HA, HBP, & hyponatremia (Na=119 ?etiology)> she had recently been treated for bronchitic exac w/ Levaquin & Mucinex, she thinks the HBP was a reaction "allergy" to Levaquin & doesn't want to take this antibiotic again; BP was 220 range in ER &treated w/ Cardene drip=> she was actually disch on less meds than PTA=> sdhe returned to  ER the day after disch w/ HBP & BP ~200 & she hadn't taken her Losartan yet, given IV hyralazine & BP improved to the 160s; now on Metop50Bid & Cozaar50 (prev on Hyzaar100-12.5); they suspected her diuretic in her hyponatremia which corrected w/ IVF; she has a hx of HAs from DJD/neck and FM; We reviewed the following medical problems during today's office visit >>     AR/ Asthma>  not on any inhaled meds at present; denies cough, sput, ch in dyspnea, etc; she has Allegra, Nasonex, Mucinex, Tessalon, MMW for her various symptoms...    HBP>  on Metop50Bid & Losar50 now w/ BP=146/82 today; denies visual changes, CP, palipit, dizziness, syncope, dyspnea, edema, etc; we decided to incr Losartan to 100 & add Hydralazine50-1/2Bid w/ ROV 2 wks...    CAD>  CP 11/11=> cath w/ in-stent restenosis, then CABG x3 by [name]; re-hospitalized 6/12 by [facility] for CP> cath showed severe 3 vessel CAD w/ 3/3 grafts patent & norm LVF w/ EF= 65-70%; Myoview 5/13 showed no ischemia & EF>80%; no change in meds- on Imdur30; & [name] noted that med rx is lim by side effects.    Hyperlipid>  Intol to statins; followed in the [facility] & FLP on Cres10- 2d per week reviewed but she was unable to tolerate even this low dose; off all meds on diet alone now...    GI- GERD, Divertics> on Omep40 + Levsin0.125 & Align; followed by [name] & seen 9/13 f/u for ?diverticulitis- improved after Cipro/Flagyl but CTAbd was neg x diverticulosis; he rec high fiber diet & Citrucel...    Hx left breast cancer> she had f/u Oncology 12/13- on Femara2.5 & tol ok; last mammogram 10/13 was ok as well...    Others>  she has persistant FM symptoms and chr HAs, uses Tramadol & Advil Prn... We reviewed prob list, meds, xrays and labs> see below for updates >>   CXR 10/15 showed fe chr changes in lungs, norm  heart size & s/p CABG, DJD in spine, NAD.Marland KitchenMarland Kitchen  EKG 11/15 showed NSR, rate69, NSSTTWA, NAD...  Lab cumulative summary sheet reviewed...  PLAN>> we  decided to incr Losartan to 100mg /d and add Hydralazine50-1/2Bid, continue Metop50Bid and the Imdur30 as before; she knows to aqvoid excess sodium in her diet (esp now that hyponatremia resolved & she's off diuretic; ROV 2wks...            Problem List:  GLAUCOMA (ICD-365.9) - she is sched for right cataract surg by DrBevis in Feb...  ALLERGIC RHINITIS (ICD-477.9) - she uses Allegra & Nasonex Prn; on Allergy shots weekly x yrs... ~  She is followed by [name] at the [place] [facility] asthma[facility] facility=> she remains on allergy shots...  ASTHMA & Asthmatic Bronchitis >> off Symbicort & using XOPENEX HFA as needed rescue inhaler... ~  5/11:  notes incr chest symptoms in the heat & rec to take the Symbicort 2sp Bid... ~  2012:  After her CABG & repeat hosp by Cards she is noted to be off her inhalers... ~  10/12:  Treated w/ Zpak, Pred, Mucinex, plus her Symbicort80 & improved... ~  CXR 5/13 showed s/p CABG, clear lungs, NAD (she went to the Er w/ cough & had neg eval). ~  7/13:  [name] stopped her Symbicort & switched to [facility] HFA as needed... ~  2/14: she does not list any inhaled meds at present & states breathing OK w/o exac... ~  4/14: she was hosp after pill asp w/ asthma exac & improved w/ supportive Rx, didn't require bronch, CXR was clear, grad improved post hosp... ~  CXR 4/14 showed normal heart size, s/p CABG, sl tort Ao, bilat apic pleural scarring, NAD...  ~  6/15: she is stable w/o asthma exac and has avoided URIs/ bronchitis/ etc... ~  10/15: presented w/ URI/ bronchitic exac w/ cough, yellow-green sput, congestion, hoarseness, etc; CXR w/o signs of pneumonia & treated w/ Depo, Levaquin, Mucinex, MMW, Delsym, etc...  ~  CXR 10/15 showed fe chr changes in lungs, norm heart size & s/p CABG, DJD in spine, NAD  HYPERTENSION (ICD-401.9) - controlled on TOPROL 50mg Bid & HYZAAR 100-12.5 daily (off Lisinopril due to ACE cough)...  ~  8/12: BP=  128/84 & tol meds  well; denies HA, fatigue, visual changes, CP, palipit, syncope, dyspnea, edema, etc... ~  2/13: BP= 140/64 & she remains essentially asymptomatic on Rx... ~  8/13:  BP= 132/64 & she denies CP, palpit, SOB, edema, etc... ~  2/14: controlled on Toprol & Hyzaar w/ BP 144/72 today; denies visual changes, CP, palipit, dizziness, syncope, dyspnea, edema, etc... ~  5/14:  BP= 136/60 & she remains stable... ~  8/14:  BP= 134/60 and she denies CP, palpit, dizzy, SOB, edema... ~  12/14: on Metop50Bid, Hyzaar 100-12.5, Imdur30;  BP= 140/80 & she remains largely asymptomatic... ~  6/15: on ASA81, Metop50Bid, Hyzaar100-12.5, Imdur30; BP today= 136/68 & she denies CP, palpit, SOB, dizzy, edema, etc... ~  10/15: on same meds w/ BP= 160/78 & no changes meds- advised to monitor BP at home once she's over the URI illness, take meds regualrly... ~  11/15: post Hosp for HBP, HYA, hyponatremia; they stopped diuretic & disch on Metop50Bid & Losar50; she went back to ER the next day w/ BP~200 (hadn't taken her Losartan) & given IV Hydralazine w/ improvement to 160s; we decided to continue the BBlocker, increase the ARB to 100mg /d, and ADD HYDRALAZINE50- 1/2Bid for now; ROV 2 weeks.Marland KitchenMarland Kitchen  CORONARY ARTERY DISEASE (ICD-414.00) - on ASA 81mg /d (intol to 325 she says) & off Plavix per [name]...  Cardiolite 9/08 showed CP & HBP response, EF+85%... had cath 10/08 w/ single vessel dis w/ mod ostial LAD stenosis and patent LAD stent, normal LVF.[name]. option for med Rx vs off pump LIMA... she notes some CP/ pressure "burning" when walking up a certain hill during her daily walks, but states this is stable and "no worse"... ~  Myoview 9/09 was neg- no scar or ischemia, EF= 89%, hypertensive response- Lisinopril10 added. ~  f/u Myoview 3/11 was neg- no scar or ischemia, EF= 86%, norm BP response, fair exerc capacity. ~  saw [name] 5/11- no CP, he stopped her Plavix, stable- continue other meds same. ~  She had recurrent CP 11/11 w/  in-stent restenosis on cath> subseq 3 vessel CABG by DrGearhart & doing well since then... ~  6/12:  Hosp w/ CP & repeat cath showed CAD & 3/3 grafts patent, EF= 65-70% ~  Myoview 5/13 was felt to be a normal stress nuclear study- normal wall motion, no ischemia, EF=84% ~  2/14: CP 11/11=> cath w/ in-stent restenosis, then CABG x3 by [name]; re-hospitalized 6/12 by [facility] for CP> cath showed severe 3 vessel CAD w/ 3/3 grafts patent & norm LVF w/ EF= 65-70%; Myoview 5/13 showed no ischemia & EF>80%; no change in meds; & [name] noted that med rx is limited by side effects... ~  8/14 & 12/14: she remains stable on Imdur30 + Metop50Bid 7 Hyzaar100-12.5; denies CP, palpit, SOB, etc... ~  6/15: Cards f/u w/ [name] 1/15> HBP, CAD, HL; on ASA81, Metop50Bid, Hyzaar100-12.5, Imdur30; Hx neg Myoview 5/13 & doing satis- no changes made; BP today= 136/68 & she denies CP, palpit, SOB, dizzy, edema, etc ~  EKG 11/15 showed NSR, rate69, NSSTTWA, NAD   HYPERLIPIDEMIA (ICD-272.4) - on CRESTOR 10mg - 1/2 on M&F, FISH OIL 1200mg  Tid + Flax Seed Oil... she has been intol to statins in the past>  tried low dose Trilipix but stopped this due to "thigh burning">  now followed in the Lipid Clinic--- ~  [place] 7/08 showed TChol 217, TG 236, HDL 46, LDL 150...  ~  [place] 6/09 showed TChol 233, TG 263, HDL 46, LDL 150... she agrees to try LOW DOSE Trilipix. ~  FLP 11/09 showed TChol 162, TG 167, HDL 45, LDL 83... ~  [place] 11/10 showed TChol 240, TG 271, HDL 45, LDL 150... LipClin Rx w/ FishOil & FlaxSeedOil. ~  FLP 2/11 showed TChol 285, TG 156, HDL 79, LDL 180... LC is aware & working w/ her regularly. ~  [place] 5/11 showed TChol 277, TG 241, HDL55, LDL 193...  LC added Cres10 just 2d per week. ~  FLP 10/11 showed TChol 198, TG 215, HDL 53, LDL 109... much improved on Cres10 twice weekly. ~  She continues to f/u w/ Lipid clinic & they titrated up the Cres10 on M&F, plus 5mg  on Wednes> ~  FLP  10/12 was the best yet w/ TChol 167,  TG 203, HDL 54, LDL 88...  ~  [place] 4/13 showed TChol 193, TG 176, HDL 57, LDL 101 ~  She is still followed in the [facility]- note 6/13 reviewed (at that time on Cres10 on M&F, 5mg  on W) but she subseq cut herself down to 1/2 on M&F due to nausea & aching. ~  FLP 2/14 on diet alone showed TChol 194, TG 143, HDL 55, LDL 110... We reviewed diet, exercise, etc... ~  She remains off the [place] on diet alone and she declines to restart low dose rx "I can't tolerate them"  GERD (ICD-530.81) - on OMEPRAZOLE 40mg /d... last EGD by [name] 8/07 revealed sm HH, & gastritis and dilatation done... (RUT=neg, PPI Rx)... LEVSIN 0.125mg  helps her esoph spasm... Prn Phenergan for nausea.  DIVERTICULOSIS, COLON (ICD-562.10) & IRRITABLE BOWEL SYNDROME (ICD-564.1) ~  colonoscopy 2/03 by [name] showed divertics and 5 mm polyp... ~  colonoscopy 3/11 showed divertics & cecal polyp= tubular adenoma, w/ f/u planned 62yrs. ~  9/13: seen by [name] f/u for ?diverticulitis- improved after Cipro/Flagyl but CTAbd was neg x diverticulosis; he rec high fiber diet & Citrucel...  ? GALLSTONES>>  Diagnosed by [name] and referred to [facility] DrWilson, pt asked to call prn fopr incr symptoms to consider surg... ~  Abd Ultrasound 10/13 by [name] for eval CP showed> norm GB, liver, kidneys, & Ao- neg sonar.  BREAST CANCER >> Abn mammogram 10/12 showing a left breast nodule & subseq surg (partial mastectomy & sentinel node bx) by [name] 12/12 that proved to be an invasive ductal carcinoma, 2.3cm size, w/ neg lymphovasc invasion, clear margins (but close at 27mm), & neg nodes; she had XRT by DrKindard (had her 11th treatment today); and Oncology eval by [name] who plans hormone Rx later (no chemo she says)...  ~  2/13: She is tolerating treatment well & spirits are up, [name] had to aspirate a seroma & it is now resolved... ~  She is followed by [name], [place], [place]- now on [facility] 2.5mg /d w/ 78yrs planned... ~  6/14:  She  had f/u [name] 6/14> his note is reviewed- s/p lumpectomy, sentinel node, XRT; plan is to continue Femara2.5mg  thru 3/16 for 41yrs rx. ~  10/14:  She had routine f/u [name] 10/14> Hx left breast cancer dx 10/12 & treated w/ lumpectomy, XRT, & now Femara; exam neg, f/u mammogram neg, rec f/u 52yr. ~  6/15:  She saw Oncology for f/u of her left Breast Cancer> on Femara2.5 & tol well, s/p lumpectomy & sentinel node bx 12/12, followed by XRT finished 3/13, & on Letrozole since then, no known recurrence...   DJD, Neck Pain >> ~  XRays Cspine 4/14 in hosp showed multilevel cerv spondylosis, 1-55mm anterolisthesis C4 on C5, DDD, foraminal stenosis at C5-6...  ~  5/14:  MRI Cspine => multilevel facet hypertrophy, foraminal narrowing & stenosis, disc osteophyte complexes, some central canal stenosis=> she was sent to NS, [name] who rec Mobic & PT (helped alittle) but she was not pleased... ~  8/14:  She is requesting a Rheumatology 2nd opinion about her neck discomfort=> seen by [facility]- neck pain, this exac HAs/migraines, dizzy/ light headed; MRI=> NS consult & not a surg cand & NSAIDs/ PT w/ min benefit; saw Neurology- tried [name] but feels hung-over; for her DDD & OA they rec Flex5mg Tid which has helped some...  FIBROMYALGIA (ICD-729.1) - she c/o chr fatigue, aching/ sore, on Tramadol & Tylenol, but most benefit from low dose Hydrocodone ~1/2 tab Prn... I have recommended an increase exercise program to her... ~  5/11:  Vit D level = 50 on 50000 u weekly by Helene Shoe, NP  HEADACHE (ICD-784.0) - eval at [facility] clinic in 2002, but she notes Rx didn't help... ~  3/11:  presented w/ muscle contraction HA's> Rx Tramadol 50mg  Q6H Prn. ~  She has persistent/ recurrent HAs...  DIZZINESS, CHRONIC (ICD-780.4) - MRI 2/09 per [name] showed atrophy, sm vessel dis, NAD...  Health Maintenance: ~  GI:  [name] & up to date on colon screening. ~  GYN:  yearly f/u w/ Helene Shoe, NP for PAP,  Mammogram at [facility], ?last BMD. ~  Immunizations:  yearly Flu vaccines in fall, Pneumovax in 2007?, TDAP given 5/11...   Past Surgical History  Procedure Laterality Date  . Coronary angioplasty with stent placement      Stent 2007  . Open heart surgery      01/19/2010  . Coronary artery bypass graft    . Cataract extraction, bilateral      bilateral caaract removal,  . Breast lumpectomy Left 02/06/11    Outpatient Encounter Prescriptions as of 12/30/2013  Medication Sig  . aspirin EC 81 MG tablet Take 81 mg by mouth daily.  . benzonatate (TESSALON) 200 MG capsule Take 1 capsule (200 mg total) by mouth 3 (three) times daily as needed for cough.  . bifidobacterium infantis (ALIGN) capsule Take 1 capsule by mouth daily.  . Diphenhyd-Hydrocort-Nystatin (FIRST-DUKES MOUTHWASH) SUSP Use as directed 5 mLs in the mouth or throat 4 (four) times daily as needed (mouth pain). Gargle and Swallow  . fexofenadine (ALLEGRA) 180 MG tablet Take 180 mg by mouth daily.    . fluocinonide (LIDEX) 0.05 % external solution Apply 1 application topically daily as needed (scalp itching).   . Glucosamine-Chondroit-Vit C-Mn (GLUCOSAMINE CHONDR 1500 COMPLX PO) Take 1 tablet by mouth 3 (three) times daily.    [name] guaiFENesin (MUCINEX) 600 MG 12 hr tablet Take 1,200 mg by mouth 2 (two) times daily.  . hyoscyamine (ANASPAZ) 0.125 MG TBDP disintergrating tablet Place 0.125 mg under the tongue every 6 (six) hours as needed for bladder spasms or cramping.  [name] ibuprofen (ADVIL,MOTRIN) 200 MG tablet Take 200 mg by mouth daily as needed (pain).  . isosorbide mononitrate (IMDUR) 30 MG 24 hr tablet Take 1 tablet (30 mg total) by mouth daily.  [name] ketoconazole (NIZORAL) 2 % shampoo Apply 1 application topically 2 (two) times a week. As needed for itching a couple of times week  . latanoprost (XALATAN) 0.005 % ophthalmic solution Place 1 drop into both eyes at bedtime.   [name] letrozole (FEMARA) 2.5 MG tablet Take 1 tablet (2.5 mg  total) by mouth daily.  [name] losartan (COZAAR) 50 MG tablet Take 1 tablet (50 mg total) by mouth daily.  . metoprolol (LOPRESSOR) 50 MG tablet Take 1 tablet (50 mg total) by mouth 2 (two) times daily.  . mometasone (NASONEX) 50 MCG/ACT nasal spray Place 2 sprays into the nose daily as needed (congestion).   . nitroGLYCERIN (NITROSTAT) 0.4 MG SL tablet Place 0.4 mg under the tongue every 5 (five) minutes as needed for chest pain.  . Omega-3 Fatty Acids (FISH OIL) 1200 MG CAPS Take 1,200 mg by mouth daily.   [name] omeprazole (PRILOSEC) 40 MG capsule Take 40 mg by mouth daily. 30 minutes before 1st meal of the day  . tiZANidine (ZANAFLEX) 2 MG tablet Take 2 mg by mouth at bedtime.  . vitamin B-12 (CYANOCOBALAMIN) 1000 MCG tablet Take 1,000 mcg by mouth daily.    . [DISCONTINUED] levofloxacin (LEVAQUIN) 500 MG tablet Take 1 tablet (500 mg total) by mouth daily.    Allergies  Allergen Reactions  . Choline Fenofibrate Other (See Comments)     pt states INTOL to Trilipix w/ "thigh burning"  . Simvastatin Other (See Comments)     pt states INTOL to STATINS \\T \ refuses to restart  . Hydrocodone     Nightmare after taking cough  syrup w/hydrocodone  . Levaquin [Levofloxacin In D5w]     Elevated BP  . Adhesive [Tape] Rash  . Ceclor [Cefaclor] Rash  . Clarithromycin Rash  . Codeine Nausea Only  . Doxycycline Rash  . Lisinopril Cough    Developed ACE cough...  . Penicillins Itching and Rash    At injection site  . Tobramycin-Dexamethasone Rash    Current Medications, Allergies, Past Medical History, Past Surgical History, Family History, and Social History were reviewed in [employer] record.    Review of Systems    See HPI - all other systems neg except as noted...  The patient complains of chest pain and headaches.  The patient denies anorexia, fever, weight loss, weight gain, vision loss, decreased hearing, hoarseness, syncope, dyspnea on exertion, peripheral edema,  prolonged cough, hemoptysis, abdominal pain, melena, hematochezia, severe indigestion/heartburn, hematuria, incontinence, muscle weakness, suspicious skin lesions, transient blindness, difficulty walking, depression, unusual weight change, abnormal bleeding, enlarged lymph nodes, and angioedema.   Objective:   Physical Exam    WD, WN, 78 y/o WF in NAD... GENERAL:  Alert & oriented; pleasant & cooperative... HEENT:  [place]/AT, EOM-wnl, PERRLA, EACs-clear, TMs-wnl, NOSE-clear, THROAT- sl erythema w/o exud NECK:  Decr ROM; no JVD; normal carotid impulses w/o bruits; no thyromegaly or nodules palpated; no lymphadenopathy. CHEST:  Bilat rhonchi, exp wheezing, no signs of consolidation... HEART:  Regular Rhythm; without murmurs/ rubs/ or gallops detected... ABDOMEN:  Soft & nontender; normal bowel sounds; no organomegaly or masses palpated... EXT: without deformities, mild arthritic changes and +trigger points, no varicose veins/ venous insuffic/ or edema. NEURO:  CN's intact; motor testing normal; sensory testing normal; gait normal & balance OK. DERM:  No lesions noted; no rash etc...  RADIOLOGY DATA:  Reviewed in the EPIC EMR & discussed w/ the patient...  LABORATORY DATA:  Reviewed in the EPIC EMR & discussed w/ the patient...   Assessment & Plan:   URI/ Bronchitic exac 10/15>>  Treated w/ Depo, Levaquin, Mucinex, MMW, Delsym... Asthma/ AR>  Stable on allergy shots & XOPENEX as needed; no recent exac... Hx pill asp w/ asthma exac=> resolved...  HBP>  BP elev recently & she thinks due to Levaquin, refuses that Ab in the future; [facility] Metop50Bid, Losar100, Hydralazine50-1/2Bid...  CAD>  Cath w/ patent grafts; had neg Nuclear study; continue same meds and f/u [name]...  CHOL>  Followed in the [facility] & intol to all meds- FLP looks better however on diet alone...  GI> GERD, Divertics, ?Gallstones>  Followed by [facility] & stones eval  by DrWilson CCS; odd that f/u sonar 9/13 did not show stones...  BREAST CANCER>  on FEMARA > treated by [name], [name], DrKinard & records reviewed...  FM/ HAs/ etc>  On Ultram, Tylenol, etc... NECK PAIN> with multilevel spondylosis & eval by NS, [name] but she was not pleased & is asking for Rheum eval...  Other medical issues as noted> on CoQ10, Glucosamine, Vit B12, Vit D, etc...   Patient's Medications  New Prescriptions   HYDRALAZINE (APRESOLINE) 50 MG TABLET    Take 1/2 tablet by mouth two times daily   LOSARTAN (COZAAR) 100 MG TABLET    Take 1/2 tablet by mouth two times daily   TRAMADOL (ULTRAM) 50 MG TABLET    Take 1 tablet (50 mg total) by mouth 3 (three) times daily as needed.  Previous Medications   ASPIRIN EC 81 MG TABLET    Take 81 mg  by mouth daily.   BENZONATATE (TESSALON) 200 MG CAPSULE    Take 1 capsule (200 mg total) by mouth 3 (three) times daily as needed for cough.   BIFIDOBACTERIUM INFANTIS (ALIGN) CAPSULE    Take 1 capsule by mouth daily.   DIPHENHYD-HYDROCORT-NYSTATIN (FIRST-DUKES MOUTHWASH) SUSP    Use as directed 5 mLs in the mouth or throat 4 (four) times daily as needed (mouth pain). Gargle and Swallow   FEXOFENADINE (ALLEGRA) 180 MG TABLET    Take 180 mg by mouth daily.     FLUOCINONIDE (LIDEX) 0.05 % EXTERNAL SOLUTION    Apply 1 application topically daily as needed (scalp itching).    GLUCOSAMINE-CHONDROIT-VIT C-MN (GLUCOSAMINE CHONDR 1500 COMPLX PO)    Take 1 tablet by mouth 3 (three) times daily.     GUAIFENESIN (MUCINEX) 600 MG 12 HR TABLET    Take 1,200 mg by mouth 2 (two) times daily.   HYOSCYAMINE (ANASPAZ) 0.125 MG TBDP DISINTERGRATING TABLET    Place 0.125 mg under the tongue every 6 (six) hours as needed for bladder spasms or cramping.   IBUPROFEN (ADVIL,MOTRIN) 200 MG TABLET    Take 200 mg by mouth daily as needed (pain).   ISOSORBIDE MONONITRATE (IMDUR) 30 MG 24 HR TABLET    Take 1 tablet (30 mg total) by mouth daily.   KETOCONAZOLE  (NIZORAL) 2 % SHAMPOO    Apply 1 application topically 2 (two) times a week. As needed for itching a couple of times week   LATANOPROST (XALATAN) 0.005 % OPHTHALMIC SOLUTION    Place 1 drop into both eyes at bedtime.    LETROZOLE (FEMARA) 2.5 MG TABLET    Take 1 tablet (2.5 mg total) by mouth daily.   METOPROLOL (LOPRESSOR) 50 MG TABLET    Take 1 tablet (50 mg total) by mouth 2 (two) times daily.   MOMETASONE (NASONEX) 50 MCG/ACT NASAL SPRAY    Place 2 sprays into the nose daily as needed (congestion).    NITROGLYCERIN (NITROSTAT) 0.4 MG SL TABLET    Place 0.4 mg under the tongue every 5 (five) minutes as needed for chest pain.   OMEGA-3 FATTY ACIDS (FISH OIL) 1200 MG CAPS    Take 1,200 mg by mouth daily.    TIZANIDINE (ZANAFLEX) 2 MG TABLET    Take 2 mg by mouth at bedtime.   VITAMIN B-12 (CYANOCOBALAMIN) 1000 MCG TABLET    Take 1,000 mcg by mouth daily.    Modified Medications   Modified Medication Previous Medication   OMEPRAZOLE (PRILOSEC) 40 MG CAPSULE omeprazole (PRILOSEC) 40 MG capsule      Take 1 capsule (40 mg total) by mouth daily. 30 minutes before 1st meal of the day    Take 40 mg by mouth daily. 30 minutes before 1st meal of the day  Discontinued Medications   LEVOFLOXACIN (LEVAQUIN) 500 MG TABLET    Take 1 tablet (500 mg total) by mouth daily.   LOSARTAN (COZAAR) 50 MG TABLET    Take 1 tablet (50 mg total) by mouth daily.

## 2013-12-31 ENCOUNTER — Telehealth: Payer: Self-pay | Admitting: Pulmonary Disease

## 2013-12-31 NOTE — Telephone Encounter (Signed)
Called and spoke to pt. Pt questioned how much to take of the losartan because she has the 50mg  tablets. Informed pt that Dr. Lenna Gilford wanted the pt to take 50mg  BID, pt using 50mg  tablets-informed her to take 1 tablet twice a day. Pt verbalized understanding and denied any further questions or concerns at this time.

## 2014-01-03 ENCOUNTER — Emergency Department (HOSPITAL_BASED_OUTPATIENT_CLINIC_OR_DEPARTMENT_OTHER)
Admission: EM | Admit: 2014-01-03 | Discharge: 2014-01-03 | Disposition: A | Discharge status: Home / Self Care | Payer: Medicare Other | Attending: Emergency Medicine | Admitting: Emergency Medicine

## 2014-01-03 ENCOUNTER — Emergency Department (HOSPITAL_BASED_OUTPATIENT_CLINIC_OR_DEPARTMENT_OTHER): Payer: Medicare Other

## 2014-01-03 ENCOUNTER — Encounter (HOSPITAL_BASED_OUTPATIENT_CLINIC_OR_DEPARTMENT_OTHER): Payer: Self-pay | Admitting: Emergency Medicine

## 2014-01-03 ENCOUNTER — Telehealth: Payer: Self-pay | Admitting: Pulmonary Disease

## 2014-01-03 DIAGNOSIS — Z79899 Other long term (current) drug therapy: Secondary | ICD-10-CM | POA: Diagnosis not present

## 2014-01-03 DIAGNOSIS — I10 Essential (primary) hypertension: Secondary | ICD-10-CM | POA: Insufficient documentation

## 2014-01-03 DIAGNOSIS — M47812 Spondylosis without myelopathy or radiculopathy, cervical region: Secondary | ICD-10-CM | POA: Diagnosis not present

## 2014-01-03 DIAGNOSIS — I25810 Atherosclerosis of coronary artery bypass graft(s) without angina pectoris: Secondary | ICD-10-CM | POA: Insufficient documentation

## 2014-01-03 DIAGNOSIS — Z7951 Long term (current) use of inhaled steroids: Secondary | ICD-10-CM | POA: Diagnosis not present

## 2014-01-03 DIAGNOSIS — Z9889 Other specified postprocedural states: Secondary | ICD-10-CM | POA: Insufficient documentation

## 2014-01-03 DIAGNOSIS — Z951 Presence of aortocoronary bypass graft: Secondary | ICD-10-CM | POA: Insufficient documentation

## 2014-01-03 DIAGNOSIS — H409 Unspecified glaucoma: Secondary | ICD-10-CM | POA: Insufficient documentation

## 2014-01-03 DIAGNOSIS — Z853 Personal history of malignant neoplasm of breast: Secondary | ICD-10-CM | POA: Insufficient documentation

## 2014-01-03 DIAGNOSIS — Z8701 Personal history of pneumonia (recurrent): Secondary | ICD-10-CM | POA: Insufficient documentation

## 2014-01-03 DIAGNOSIS — I6782 Cerebral ischemia: Secondary | ICD-10-CM | POA: Diagnosis not present

## 2014-01-03 DIAGNOSIS — M797 Fibromyalgia: Secondary | ICD-10-CM | POA: Insufficient documentation

## 2014-01-03 DIAGNOSIS — E780 Pure hypercholesterolemia: Secondary | ICD-10-CM | POA: Diagnosis not present

## 2014-01-03 DIAGNOSIS — Z7982 Long term (current) use of aspirin: Secondary | ICD-10-CM | POA: Diagnosis not present

## 2014-01-03 DIAGNOSIS — Z880 Allergy status to penicillin: Secondary | ICD-10-CM | POA: Diagnosis not present

## 2014-01-03 DIAGNOSIS — Z86010 Personal history of colonic polyps: Secondary | ICD-10-CM | POA: Diagnosis not present

## 2014-01-03 DIAGNOSIS — J45909 Unspecified asthma, uncomplicated: Secondary | ICD-10-CM | POA: Diagnosis not present

## 2014-01-03 DIAGNOSIS — Z955 Presence of coronary angioplasty implant and graft: Secondary | ICD-10-CM | POA: Diagnosis not present

## 2014-01-03 DIAGNOSIS — R51 Headache: Secondary | ICD-10-CM | POA: Diagnosis not present

## 2014-01-03 DIAGNOSIS — K219 Gastro-esophageal reflux disease without esophagitis: Secondary | ICD-10-CM | POA: Diagnosis not present

## 2014-01-03 LAB — BASIC METABOLIC PANEL
Anion gap: 12 (ref 5–15)
BUN: 14 mg/dL (ref 6–23)
CO2: 28 mEq/L (ref 19–32)
Calcium: 10.2 mg/dL (ref 8.4–10.5)
Chloride: 94 mEq/L — ABNORMAL LOW (ref 96–112)
Creatinine, Ser: 0.6 mg/dL (ref 0.50–1.10)
GFR, EST NON AFRICAN AMERICAN: 86 mL/min — AB (ref >90)
Glucose, Bld: 90 mg/dL (ref 70–99)
Potassium: 4.9 mEq/L (ref 3.7–5.3)
SODIUM: 134 meq/L — AB (ref 137–147)

## 2014-01-03 LAB — CBC WITH DIFFERENTIAL/PLATELET
BASOS PCT: 1 % (ref 0–1)
Basophils Absolute: 0.1 10*3/uL (ref 0.0–0.1)
EOS ABS: 0.3 10*3/uL (ref 0.0–0.7)
Eosinophils Relative: 5 % (ref 0–5)
HCT: 41.9 % (ref 36.0–46.0)
Hemoglobin: 14.3 g/dL (ref 12.0–15.0)
Lymphocytes Relative: 47 % — ABNORMAL HIGH (ref 12–46)
Lymphs Abs: 3.4 10*3/uL (ref 0.7–4.0)
MCH: 30.4 pg (ref 26.0–34.0)
MCHC: 34.1 g/dL (ref 30.0–36.0)
MCV: 89 fL (ref 78.0–100.0)
Monocytes Absolute: 0.6 10*3/uL (ref 0.1–1.0)
Monocytes Relative: 8 % (ref 3–12)
NEUTROS PCT: 39 % — AB (ref 43–77)
Neutro Abs: 2.8 10*3/uL (ref 1.7–7.7)
PLATELETS: 277 10*3/uL (ref 150–400)
RBC: 4.71 MIL/uL (ref 3.87–5.11)
RDW: 14.6 % (ref 11.5–15.5)
WBC: 7.1 10*3/uL (ref 4.0–10.5)

## 2014-01-03 LAB — URINALYSIS, ROUTINE W REFLEX MICROSCOPIC
Bilirubin Urine: NEGATIVE
Glucose, UA: NEGATIVE mg/dL
HGB URINE DIPSTICK: NEGATIVE
Ketones, ur: NEGATIVE mg/dL
LEUKOCYTES UA: NEGATIVE
Nitrite: NEGATIVE
Protein, ur: NEGATIVE mg/dL
Specific Gravity, Urine: 1.006 (ref 1.005–1.030)
UROBILINOGEN UA: 0.2 mg/dL (ref 0.0–1.0)
pH: 6.5 (ref 5.0–8.0)

## 2014-01-03 MED ORDER — KETOROLAC TROMETHAMINE 30 MG/ML IJ SOLN
30.0000 mg | Freq: Once | INTRAMUSCULAR | Status: AC
  Administered 2014-01-03: 30 mg via INTRAVENOUS

## 2014-01-03 MED ORDER — AMLODIPINE BESYLATE 5 MG PO TABS
5.0000 mg | ORAL_TABLET | Freq: Once | ORAL | Status: DC
  Filled 2014-01-03: qty 1

## 2014-01-03 MED ORDER — HYDRALAZINE HCL 20 MG/ML IJ SOLN
5.0000 mg | Freq: Once | INTRAMUSCULAR | Status: AC
  Administered 2014-01-03: 5 mg via INTRAVENOUS
  Filled 2014-01-03: qty 1

## 2014-01-03 MED ORDER — KETOROLAC TROMETHAMINE 30 MG/ML IJ SOLN
INTRAMUSCULAR | Status: AC
  Administered 2014-01-03: 30 mg via INTRAVENOUS
  Filled 2014-01-03: qty 1

## 2014-01-03 NOTE — ED Notes (Signed)
Pt reports that just before midnight she began to have a headache and took 50 mg tramadol w/o relief

## 2014-01-03 NOTE — Telephone Encounter (Signed)
Per SN---  She will need to review the AVS that was given to her at her last OV with Dr. Lenna Gilford, it tells her how to take her medications. Does she need to have her family help her with her medications? She should be on the following medications:  Metoprolol 50 mg  1 tablet two times daily Losartan 50 mg 1 tablet two times daily ( 100 mg  1/2 tablet BID) Hydralazine 50 mg  1 po BID  ( this was 1/2 tablet BID) imdur 30 mg  1 daily for her heart.    thanks

## 2014-01-03 NOTE — ED Provider Notes (Signed)
CSN: [id number]     Arrival date & time 01/03/14  0256 History   First MD Initiated Contact with Patient 01/03/14 0341     Chief Complaint  Patient presents with  . Headache     (Consider location/radiation/quality/duration/timing/severity/associated sxs/prior Treatment) Patient is a 78 y.o. female presenting with headaches. The history is provided by the patient. No language interpreter was used.  Headache Pain location:  Generalized Quality:  Dull Radiates to:  Does not radiate Severity currently:  7/10 Severity at highest:  8/10 Onset quality:  Gradual Timing:  Constant Progression:  Unchanged Chronicity:  Recurrent Context: not activity and not exposure to bright light   Relieved by:  Nothing Worsened by:  Nothing tried Ineffective treatments:  None tried Associated symptoms: no back pain, no blurred vision, no congestion, no cough, no diarrhea, no dizziness, no drainage, no ear pain, no pain, no fever, no focal weakness, no nausea, no near-syncope, no neck pain, no neck stiffness, no numbness, no paresthesias, no photophobia, no seizures, no sore throat, no swollen glands, no URI, no visual change, no vomiting and no weakness   Risk factors: no anger     Past Medical History  Diagnosis Date  . Unspecified glaucoma   . Allergic rhinitis, cause unspecified   . Unspecified asthma(493.90)   . Unspecified essential hypertension   . Other and unspecified hyperlipidemia   . Gastritis   . Abdominal pain, left lower quadrant   . Diverticulosis of colon (without mention of hemorrhage)   . Irritable bowel syndrome   . Colonic polyp 04-27-2009    tubular adenoma  . Headache(784.0)   . Dizziness   . Breast cancer, Left 12/20/2010  . Gallstones   . CAD (coronary artery disease)     a. 01/19/2010 s/p CABG x 3, lima->lad, vg->diag, vg->om1;  b. 06/2011 :Lexi MV: EF 84%, No ischemia.  . Bronchitis     hx of  . Pneumonia   . Hiatal hernia   . Fibromyalgia   . Esophageal reflux      hiatal hernia  . Atrophic vaginitis   . GERD (gastroesophageal reflux disease)   . Hypercholesterolemia   . Breast cancer   . Glaucoma   . Cervical arthritis    Past Surgical History  Procedure Laterality Date  . Coronary angioplasty with stent placement      Stent 2007  . Open heart surgery      01/19/2010  . Coronary artery bypass graft    . Cataract extraction, bilateral      bilateral caaract removal,  . Breast lumpectomy Left 02/06/11   Family History  Problem Relation Age of Onset  . Heart disease Brother   . Cancer Brother     gland cancer  . Diabetes Sister   . Breast cancer Sister   . Stroke Mother   . Stroke Father   . Colon cancer Neg Hx   . Stomach cancer Neg Hx   . Pancreatic cancer Neg Hx   . Prostate cancer Father   . Kidney disease Neg Hx   . Liver disease Neg Hx    History  Substance Use Topics  . Smoking status: Never Smoker   . Smokeless tobacco: Never Used  . Alcohol Use: No   OB History    Gravida Para Term Preterm AB TAB SAB Ectopic Multiple Living   3 2   1     2      Review of Systems  Constitutional: Negative for fever.  HENT:  Negative for congestion, ear pain, postnasal drip and sore throat.   Eyes: Negative for blurred vision, photophobia and pain.  Respiratory: Negative for cough.   Cardiovascular: Negative for chest pain, palpitations, leg swelling and near-syncope.  Gastrointestinal: Negative for nausea, vomiting and diarrhea.  Musculoskeletal: Negative for back pain, neck pain and neck stiffness.  Neurological: Positive for headaches. Negative for dizziness, focal weakness, seizures, numbness and paresthesias.  All other systems reviewed and are negative.     Allergies  Choline fenofibrate; Simvastatin; Hydrocodone; Levaquin; Adhesive; Ceclor; Clarithromycin; Codeine; Doxycycline; Lisinopril; Penicillins; and Tobramycin-dexamethasone  Home Medications   Prior to Admission medications   Medication Sig Start Date End  Date Taking? Authorizing Provider  aspirin EC 81 MG tablet Take 81 mg by mouth daily.    Historical Provider, MD  benzonatate (TESSALON) 200 MG capsule Take 1 capsule (200 mg total) by mouth 3 (three) times daily as needed for cough. 12/27/13   Samella Parr, NP  bifidobacterium infantis (ALIGN) capsule Take 1 capsule by mouth daily.    Historical Provider, MD  Diphenhyd-Hydrocort-Nystatin (FIRST-DUKES MOUTHWASH) SUSP Use as directed 5 mLs in the mouth or throat 4 (four) times daily as needed (mouth pain). Gargle and Swallow    Historical Provider, MD  fexofenadine (ALLEGRA) 180 MG tablet Take 180 mg by mouth daily.      Historical Provider, MD  fluocinonide (LIDEX) 0.05 % external solution Apply 1 application topically daily as needed (scalp itching).  04/29/13   Historical Provider, MD  Glucosamine-Chondroit-Vit C-Mn (GLUCOSAMINE CHONDR 1500 COMPLX PO) Take 1 tablet by mouth 3 (three) times daily.      Historical Provider, MD  guaiFENesin (MUCINEX) 600 MG 12 hr tablet Take 1,200 mg by mouth 2 (two) times daily.    Historical Provider, MD  hydrALAZINE (APRESOLINE) 50 MG tablet Take 1/2 tablet by mouth two times daily 12/30/13   Noralee Space, MD  hyoscyamine (ANASPAZ) 0.125 MG TBDP disintergrating tablet Place 0.125 mg under the tongue every 6 (six) hours as needed for bladder spasms or cramping.    Historical Provider, MD  ibuprofen (ADVIL,MOTRIN) 200 MG tablet Take 200 mg by mouth daily as needed (pain).    Historical Provider, MD  isosorbide mononitrate (IMDUR) 30 MG 24 hr tablet Take 1 tablet (30 mg total) by mouth daily. 12/13/13   Josue Hector, MD  ketoconazole (NIZORAL) 2 % shampoo Apply 1 application topically 2 (two) times a week. As needed for itching a couple of times week 04/29/13   Historical Provider, MD  latanoprost (XALATAN) 0.005 % ophthalmic solution Place 1 drop into both eyes at bedtime.  07/31/12   Historical Provider, MD  letrozole (FEMARA) 2.5 MG tablet Take 1 tablet (2.5 mg total)  by mouth daily. 07/27/13   Amy Milda Smart, PA-C  losartan (COZAAR) 100 MG tablet Take 1/2 tablet by mouth two times daily 12/30/13   Noralee Space, MD  metoprolol (LOPRESSOR) 50 MG tablet Take 1 tablet (50 mg total) by mouth 2 (two) times daily. 12/30/13   Josue Hector, MD  mometasone (NASONEX) 50 MCG/ACT nasal spray Place 2 sprays into the nose daily as needed (congestion).     Historical Provider, MD  nitroGLYCERIN (NITROSTAT) 0.4 MG SL tablet Place 0.4 mg under the tongue every 5 (five) minutes as needed for chest pain.    Historical Provider, MD  Omega-3 Fatty Acids (FISH OIL) 1200 MG CAPS Take 1,200 mg by mouth daily.     Historical Provider, MD  omeprazole (PRILOSEC) 40 MG capsule Take 1 capsule (40 mg total) by mouth daily. 30 minutes before 1st meal of the day 12/30/13   Noralee Space, MD  tiZANidine (ZANAFLEX) 2 MG tablet Take 2 mg by mouth at bedtime.    Historical Provider, MD  traMADol (ULTRAM) 50 MG tablet Take 1 tablet (50 mg total) by mouth 3 (three) times daily as needed. 12/30/13   Noralee Space, MD  vitamin B-12 (CYANOCOBALAMIN) 1000 MCG tablet Take 1,000 mcg by mouth daily.      Historical Provider, MD   BP 204/85 mmHg  Pulse 59  Temp(Src) 97.8 F (36.6 C) (Oral)  Resp 18  Ht 4' 10.5" (1.486 m)  Wt 134 lb (60.782 kg)  BMI 27.53 kg/m2  SpO2 100%  LMP 11/26/1990 Physical Exam  Constitutional: She is oriented to person, place, and time. She appears well-developed and well-nourished. No distress.  HENT:  Head: Normocephalic and atraumatic.  Right Ear: External ear normal.  Left Ear: External ear normal.  Mouth/Throat: Oropharynx is clear and moist.  Eyes: Conjunctivae and EOM are normal. Pupils are equal, round, and reactive to light.  Neck: Normal range of motion. Neck supple.  Cardiovascular: Normal rate, regular rhythm and intact distal pulses.   Pulmonary/Chest: Effort normal and breath sounds normal. She has no wheezes. She has no rales.  Abdominal: Soft. Bowel sounds are  normal. There is no tenderness. There is no rebound and no guarding.  Musculoskeletal: Normal range of motion.  Neurological: She is alert and oriented to person, place, and time. She has normal reflexes. No cranial nerve deficit.  Skin: Skin is warm and dry.  Psychiatric: She has a normal mood and affect.    ED Course  Procedures (including critical care time) Labs Review Labs Reviewed  CBC WITH DIFFERENTIAL  BASIC METABOLIC PANEL    Imaging Review No results found.   EKG Interpretation None      MDM   Final diagnoses:  None     Date: 01/03/2014  Rate: 58  Rhythm: sinus bradycardia  QRS Axis: normal  Intervals: normal  ST/T Wave abnormalities: nonspecific ST changes  Conduction Disutrbances:none  Narrative Interpretation:   Old EKG Reviewed: unchanged   Suspect BP high in the evening due to the way patient is taking her meds she is not spreading the BID medications out every 12 hours and is taking some too close together and is then left at least partially uncovered by medications in the evening.  She will need to follow up with her PMD and cardiologist regarding dose adjustment and staggering of medications in order to achieve better 24 hour coverage.  Metoprolol cannot be increased further due to effects on rate.        Carlisle Beers, MD 01/03/14 [phone]

## 2014-01-03 NOTE — Telephone Encounter (Signed)
ATC x 2 busy signal. wcb 

## 2014-01-03 NOTE — Telephone Encounter (Signed)
Pt calling back & can possibly be reached at [phone].  [name]

## 2014-01-03 NOTE — Telephone Encounter (Signed)
ATC line busy x 3 wcb 

## 2014-01-03 NOTE — ED Notes (Signed)
I first took auto BP with result of 210/81, nurse Bobby re-checked manually and got 212/90. I then took auto BP again with smaller cuff and got result of 204/85. I notified nurse Channin and Dr. Randal Buba.

## 2014-01-03 NOTE — Telephone Encounter (Signed)
Per pt chart, pt is in ED for headache and unchanged BP. BP was 204/85 upon arrival to ED.  Will send to SN as FYI.

## 2014-01-03 NOTE — Telephone Encounter (Signed)
Spoke with pt, she was seen at [facility] early this morning because her blood pressure was over 200 according to her.  She rechecked it about 9:00 this morning and it was 188/83.  She was told by the medcenter to make an appt with SN because she is not taking her BP meds correctly.  Pt was just seen on 11/5, has rov on 11/20.  SN please advise.  Thank you.  Allergies  Allergen Reactions  . Choline Fenofibrate Other (See Comments)     pt states INTOL to Trilipix w/ "thigh burning"  . Simvastatin Other (See Comments)     pt states INTOL to STATINS \\T \ refuses to restart  . Hydrocodone     Nightmare after taking cough syrup w/hydrocodone  . Levaquin [Levofloxacin In D5w]     Elevated BP  . Adhesive [Tape] Rash  . Ceclor [Cefaclor] Rash  . Clarithromycin Rash  . Codeine Nausea Only  . Doxycycline Rash  . Lisinopril Cough    Developed ACE cough...  . Penicillins Itching and Rash    At injection site  . Tobramycin-Dexamethasone Rash   Current Outpatient Prescriptions on File Prior to Visit  Medication Sig Dispense Refill  . aspirin EC 81 MG tablet Take 81 mg by mouth daily.    . benzonatate (TESSALON) 200 MG capsule Take 1 capsule (200 mg total) by mouth 3 (three) times daily as needed for cough. 20 capsule 0  . bifidobacterium infantis (ALIGN) capsule Take 1 capsule by mouth daily.    . Diphenhyd-Hydrocort-Nystatin (FIRST-DUKES MOUTHWASH) SUSP Use as directed 5 mLs in the mouth or throat 4 (four) times daily as needed (mouth pain). Gargle and Swallow    . fexofenadine (ALLEGRA) 180 MG tablet Take 180 mg by mouth daily.      . fluocinonide (LIDEX) 0.05 % external solution Apply 1 application topically daily as needed (scalp itching).     . Glucosamine-Chondroit-Vit C-Mn (GLUCOSAMINE CHONDR 1500 COMPLX PO) Take 1 tablet by mouth 3 (three) times daily.      [name] guaiFENesin (MUCINEX) 600 MG 12 hr tablet Take 1,200 mg by mouth 2 (two) times daily.    . hydrALAZINE (APRESOLINE) 50 MG tablet  Take 1/2 tablet by mouth two times daily 30 tablet 6  . hyoscyamine (ANASPAZ) 0.125 MG TBDP disintergrating tablet Place 0.125 mg under the tongue every 6 (six) hours as needed for bladder spasms or cramping.    [name] ibuprofen (ADVIL,MOTRIN) 200 MG tablet Take 200 mg by mouth daily as needed (pain).    . isosorbide mononitrate (IMDUR) 30 MG 24 hr tablet Take 1 tablet (30 mg total) by mouth daily. 90 tablet 1  . ketoconazole (NIZORAL) 2 % shampoo Apply 1 application topically 2 (two) times a week. As needed for itching a couple of times week    . latanoprost (XALATAN) 0.005 % ophthalmic solution Place 1 drop into both eyes at bedtime.     [name] letrozole (FEMARA) 2.5 MG tablet Take 1 tablet (2.5 mg total) by mouth daily. 90 tablet 3  . losartan (COZAAR) 100 MG tablet Take 1/2 tablet by mouth two times daily 30 tablet 6  . metoprolol (LOPRESSOR) 50 MG tablet Take 1 tablet (50 mg total) by mouth 2 (two) times daily. 60 tablet 1  . mometasone (NASONEX) 50 MCG/ACT nasal spray Place 2 sprays into the nose daily as needed (congestion).     . nitroGLYCERIN (NITROSTAT) 0.4 MG SL tablet Place 0.4 mg under the tongue every 5 (five) minutes  as needed for chest pain.    . Omega-3 Fatty Acids (FISH OIL) 1200 MG CAPS Take 1,200 mg by mouth daily.     [name] omeprazole (PRILOSEC) 40 MG capsule Take 1 capsule (40 mg total) by mouth daily. 30 minutes before 1st meal of the day 30 capsule 6  . tiZANidine (ZANAFLEX) 2 MG tablet Take 2 mg by mouth at bedtime.    . traMADol (ULTRAM) 50 MG tablet Take 1 tablet (50 mg total) by mouth 3 (three) times daily as needed. 90 tablet 5  . vitamin B-12 (CYANOCOBALAMIN) 1000 MCG tablet Take 1,000 mcg by mouth daily.       No current facility-administered medications on file prior to visit.

## 2014-01-03 NOTE — Discharge Instructions (Signed)
DASH Eating Plan DASH stands for "Dietary Approaches to Stop Hypertension." The DASH eating plan is a healthy eating plan that has been shown to reduce high blood pressure (hypertension). Additional health benefits may include reducing the risk of type 2 diabetes mellitus, heart disease, and stroke. The DASH eating plan may also help with weight loss. WHAT DO I NEED TO KNOW ABOUT THE DASH EATING PLAN? For the DASH eating plan, you will follow these general guidelines:  Choose foods with a percent daily value for sodium of less than 5% (as listed on the food label).  Use salt-free seasonings or herbs instead of table salt or sea salt.  Check with your health care provider or pharmacist before using salt substitutes.  Eat lower-sodium products, often labeled as "lower sodium" or "no salt added."  Eat fresh foods.  Eat more vegetables, fruits, and low-fat dairy products.  Choose whole grains. Look for the word "whole" as the first word in the ingredient list.  Choose fish and skinless chicken or turkey more often than red meat. Limit fish, poultry, and meat to 6 oz (170 g) each day.  Limit sweets, desserts, sugars, and sugary drinks.  Choose heart-healthy fats.  Limit cheese to 1 oz (28 g) per day.  Eat more home-cooked food and less restaurant, buffet, and fast food.  Limit fried foods.  Cook foods using methods other than frying.  Limit canned vegetables. If you do use them, rinse them well to decrease the sodium.  When eating at a restaurant, ask that your food be prepared with less salt, or no salt if possible. WHAT FOODS CAN I EAT? Seek help from a dietitian for individual calorie needs. Grains Whole grain or whole wheat bread. Brown rice. Whole grain or whole wheat pasta. Quinoa, bulgur, and whole grain cereals. Low-sodium cereals. Corn or whole wheat flour tortillas. Whole grain cornbread. Whole grain crackers. Low-sodium crackers. Vegetables Fresh or frozen vegetables  (raw, steamed, roasted, or grilled). Low-sodium or reduced-sodium tomato and vegetable juices. Low-sodium or reduced-sodium tomato sauce and paste. Low-sodium or reduced-sodium canned vegetables.  Fruits All fresh, canned (in natural juice), or frozen fruits. Meat and Other Protein Products Ground beef (85% or leaner), grass-fed beef, or beef trimmed of fat. Skinless chicken or turkey. Ground chicken or turkey. Pork trimmed of fat. All fish and seafood. Eggs. Dried beans, peas, or lentils. Unsalted nuts and seeds. Unsalted canned beans. Dairy Low-fat dairy products, such as skim or 1% milk, 2% or reduced-fat cheeses, low-fat ricotta or cottage cheese, or plain low-fat yogurt. Low-sodium or reduced-sodium cheeses. Fats and Oils Tub margarines without trans fats. Light or reduced-fat mayonnaise and salad dressings (reduced sodium). Avocado. Safflower, olive, or canola oils. Natural peanut or almond butter. Other Unsalted popcorn and pretzels. The items listed above may not be a complete list of recommended foods or beverages. Contact your dietitian for more options. WHAT FOODS ARE NOT RECOMMENDED? Grains White bread. White pasta. White rice. Refined cornbread. Bagels and croissants. Crackers that contain trans fat. Vegetables Creamed or fried vegetables. Vegetables in a cheese sauce. Regular canned vegetables. Regular canned tomato sauce and paste. Regular tomato and vegetable juices. Fruits Dried fruits. Canned fruit in light or heavy syrup. Fruit juice. Meat and Other Protein Products Fatty cuts of meat. Ribs, chicken wings, bacon, sausage, bologna, salami, chitterlings, fatback, hot dogs, bratwurst, and packaged luncheon meats. Salted nuts and seeds. Canned beans with salt. Dairy Whole or 2% milk, cream, half-and-half, and cream cheese. Whole-fat or sweetened yogurt. Full-fat   cheeses or blue cheese. Nondairy creamers and whipped toppings. Processed cheese, cheese spreads, or cheese  curds. Condiments Onion and garlic salt, seasoned salt, table salt, and sea salt. Canned and packaged gravies. Worcestershire sauce. Tartar sauce. Barbecue sauce. Teriyaki sauce. Soy sauce, including reduced sodium. Steak sauce. Fish sauce. Oyster sauce. Cocktail sauce. Horseradish. Ketchup and mustard. Meat flavorings and tenderizers. Bouillon cubes. Hot sauce. Tabasco sauce. Marinades. Taco seasonings. Relishes. Fats and Oils Butter, stick margarine, lard, shortening, ghee, and bacon fat. Coconut, palm kernel, or palm oils. Regular salad dressings. Other Pickles and olives. Salted popcorn and pretzels. The items listed above may not be a complete list of foods and beverages to avoid. Contact your dietitian for more information. WHERE CAN I FIND MORE INFORMATION? National Heart, Lung, and [employer] Institute: www.nhlbi.nih.gov/health/health-topics/topics/dash/ Document Released: 01/31/2011 Document Revised: 06/28/2013 Document Reviewed: 12/16/2012 ExitCare Patient Information 2015 [employer]. This information is not intended to replace advice given to you by your health care provider. Make sure you discuss any questions you have with your health care provider. Hypertension Hypertension, commonly called high blood pressure, is when the force of blood pumping through your arteries is too strong. Your arteries are the blood vessels that carry blood from your heart throughout your body. A blood pressure reading consists of a higher number over a lower number, such as 110/72. The higher number (systolic) is the pressure inside your arteries when your heart pumps. The lower number (diastolic) is the pressure inside your arteries when your heart relaxes. Ideally you want your blood pressure below 120/80. Hypertension forces your heart to work harder to pump blood. Your arteries may become narrow or stiff. Having hypertension puts you at risk for heart disease, stroke, and other problems.  RISK  FACTORS Some risk factors for high blood pressure are controllable. Others are not.  Risk factors you cannot control include:   Race. You may be at higher risk if you are African American.  Age. Risk increases with age.  Gender. Men are at higher risk than women before age 45 years. After age 65, women are at higher risk than men. Risk factors you can control include:  Not getting enough exercise or physical activity.  Being overweight.  Getting too much fat, sugar, calories, or salt in your diet.  Drinking too much alcohol. SIGNS AND SYMPTOMS Hypertension does not usually cause signs or symptoms. Extremely high blood pressure (hypertensive crisis) may cause headache, anxiety, shortness of breath, and nosebleed. DIAGNOSIS  To check if you have hypertension, your health care provider will measure your blood pressure while you are seated, with your arm held at the level of your heart. It should be measured at least twice using the same arm. Certain conditions can cause a difference in blood pressure between your right and left arms. A blood pressure reading that is higher than normal on one occasion does not mean that you need treatment. If one blood pressure reading is high, ask your health care provider about having it checked again. TREATMENT  Treating high blood pressure includes making lifestyle changes and possibly taking medicine. Living a healthy lifestyle can help lower high blood pressure. You may need to change some of your habits. Lifestyle changes may include:  Following the DASH diet. This diet is high in fruits, vegetables, and whole grains. It is low in salt, red meat, and added sugars.  Getting at least 2 hours of brisk physical activity every week.  Losing weight if necessary.  Not smoking.  Limiting   alcoholic beverages.  Learning ways to reduce stress. If lifestyle changes are not enough to get your blood pressure under control, your health care provider may  prescribe medicine. You may need to take more than one. Work closely with your health care provider to understand the risks and benefits. HOME CARE INSTRUCTIONS  Have your blood pressure rechecked as directed by your health care provider.   Take medicines only as directed by your health care provider. Follow the directions carefully. Blood pressure medicines must be taken as prescribed. The medicine does not work as well when you skip doses. Skipping doses also puts you at risk for problems.   Do not smoke.   Monitor your blood pressure at home as directed by your health care provider. SEEK MEDICAL CARE IF:   You think you are having a reaction to medicines taken.  You have recurrent headaches or feel dizzy.  You have swelling in your ankles.  You have trouble with your vision. SEEK IMMEDIATE MEDICAL CARE IF:  You develop a severe headache or confusion.  You have unusual weakness, numbness, or feel faint.  You have severe chest or abdominal pain.  You vomit repeatedly.  You have trouble breathing. MAKE SURE YOU:   Understand these instructions.  Will watch your condition.  Will get help right away if you are not doing well or get worse. Document Released: 02/11/2005 Document Revised: 06/28/2013 Document Reviewed: 12/04/2012 ExitCare Patient Information 2015 [employer]. This information is not intended to replace advice given to you by your health care provider. Make sure you discuss any questions you have with your health care provider.  

## 2014-01-04 ENCOUNTER — Encounter: Payer: Self-pay | Admitting: Physician Assistant

## 2014-01-04 ENCOUNTER — Ambulatory Visit (INDEPENDENT_AMBULATORY_CARE_PROVIDER_SITE_OTHER): Payer: Medicare Other | Admitting: Physician Assistant

## 2014-01-04 ENCOUNTER — Telehealth: Payer: Self-pay | Admitting: *Deleted

## 2014-01-04 VITALS — BP 164/80 | HR 62 | Ht 58.5 in | Wt 132.2 lb

## 2014-01-04 DIAGNOSIS — E871 Hypo-osmolality and hyponatremia: Secondary | ICD-10-CM | POA: Diagnosis not present

## 2014-01-04 DIAGNOSIS — M9901 Segmental and somatic dysfunction of cervical region: Secondary | ICD-10-CM | POA: Diagnosis not present

## 2014-01-04 DIAGNOSIS — S134XXA Sprain of ligaments of cervical spine, initial encounter: Secondary | ICD-10-CM | POA: Diagnosis not present

## 2014-01-04 DIAGNOSIS — I10 Essential (primary) hypertension: Secondary | ICD-10-CM | POA: Diagnosis not present

## 2014-01-04 DIAGNOSIS — M5412 Radiculopathy, cervical region: Secondary | ICD-10-CM | POA: Diagnosis not present

## 2014-01-04 DIAGNOSIS — I2510 Atherosclerotic heart disease of native coronary artery without angina pectoris: Secondary | ICD-10-CM

## 2014-01-04 DIAGNOSIS — E782 Mixed hyperlipidemia: Secondary | ICD-10-CM

## 2014-01-04 MED ORDER — HYDRALAZINE HCL 50 MG PO TABS: ORAL_TABLET | ORAL | Status: DC

## 2014-01-04 NOTE — Telephone Encounter (Signed)
PT  CAME  IN  TODAY  THOUGHT  HAD  APPT WITH  DR Johnsie Cancel  PT C/O   FEELING  BAD  HEADACHES  AND  HAS  HAD  OFF AND  ON  ELEVATED  B/PS'S  SINCE  END  OF  OCT   DR  NADEL  TREATING   B/P  WAS   158/84  TODAY  DISCUSSED WITH SCOTT  WILL SEE PT  TODAY  AT  11:00 am.  Pt  Aware .

## 2014-01-04 NOTE — Telephone Encounter (Signed)
Spoke with nurse at [facility].  Pt is at their office now and thought she had ov but she does not.  Pt is still c/o headache and doesn't feel good.  They will have nurse check her b/p and call us back to inform Dr Lenna Gilford.  Will await call.

## 2014-01-04 NOTE — Patient Instructions (Signed)
INCREASE HYDRALAZINE TO 25 MG THREE TIMES DAILY  PLEASE CALL [phone] TO [facility]ER, PAC IF YOUR BLOOD PRESSURE IS REMAINING TO STAY HIGH AFTER INCREASING HYDRALAZINE TO 3 TIMES DAILY  KEEP YOUR FOLLOW UP WITH DR. NADEL  FOLLOW UP WITH DR. Johnsie Cancel IN 02/2014

## 2014-01-04 NOTE — Telephone Encounter (Signed)
Pt was seen by cardiology in office today.

## 2014-01-04 NOTE — Progress Notes (Signed)
Cardiology Office Note   Date:  01/04/2014   ID:  Lariyah, Shetterly April 26, 1935, MRN [id number]  PCP:  Noralee Space, MD  Cardiologist:  Dr. Jenkins Rouge     History of Present Illness: [name] is a 78 y.o. female with a history of CAD status post CABG 12/2009, HTN, HL, breasts CA, GERD.  Last seen by Dr. Johnsie Cancel 02/2013.  She was treated for bronchitis last month.  She was then admitted 10/29-11/2 with hypertensive emergency and hyponatremia.  HCTZ was stopped. There was no evidence of SIADH. Sodium improved.  Blood pressure was much better controlled at discharge.  She followed up with primary care who adjusted her medications further.  She was then seen in the emergency room yesterday with uncontrolled hypertension.  Follow-up with cardiology was recommended.    Patient describes a headache when her blood pressure is elevated. This typically improves with improved blood pressure. She notes that if she takes all of her medications at once, she becomes dizzy. She therefore tries to space these out. She denies significant chest discomfort. She has had some chest tightness with her recent bronchitis.  This is improved. Her cough is improved. She denies significant dyspnea. She is NYHA 2. She denies orthopnea, PND or edema. She denies syncope or near syncope. She denies facial droop, unilateral weakness or speech difficulty.   Studies:  - LHC (6/12):  Proximal LAD 100%, ostial OM1 90%, RCA patent, SVG-OM1 patent, SVG-DX patent, LIMA-LAD patent, EF 65-70%  - Nuclear (5/13):  Normal stress nuclear study.  No ischemia.  LV Ejection Fraction: 84%   Recent Labs/Images:  12/24/2013: TSH 1.600 12/27/2013: ALT 21 01/03/2014: BUN 14; Creatinine 0.60; Hemoglobin 14.3; Potassium 4.9; Sodium 134*    Ct Head Wo Contrast  01/03/2014     IMPRESSION: No acute finding.  Atrophy and chronic microvascular ischemic change.   Electronically Signed   By: Inge Rise M.D.   On: 01/03/2014 05:29      Wt Readings from Last 3 Encounters:  01/04/14 132 lb 3.2 oz (59.966 kg)  01/03/14 134 lb (60.782 kg)  12/30/13 134 lb 9.6 oz (61.054 kg)     Past Medical History  Diagnosis Date  . Unspecified glaucoma   . Allergic rhinitis, cause unspecified   . Unspecified asthma(493.90)   . Unspecified essential hypertension   . Other and unspecified hyperlipidemia   . Gastritis   . Abdominal pain, left lower quadrant   . Diverticulosis of colon (without mention of hemorrhage)   . Irritable bowel syndrome   . Colonic polyp 04-27-2009    tubular adenoma  . Headache(784.0)   . Dizziness   . Breast cancer, Left 12/20/2010  . Gallstones   . CAD (coronary artery disease)     a. 01/19/2010 s/p CABG x 3, lima->lad, vg->diag, vg->om1;  b. 06/2011 :Lexi MV: EF 84%, No ischemia.  . Bronchitis     hx of  . Pneumonia   . Hiatal hernia   . Fibromyalgia   . Esophageal reflux     hiatal hernia  . Atrophic vaginitis   . GERD (gastroesophageal reflux disease)   . Hypercholesterolemia   . Breast cancer   . Glaucoma   . Cervical arthritis     Current Outpatient Prescriptions  Medication Sig Dispense Refill  . aspirin EC 81 MG tablet Take 81 mg by mouth daily.    . benzonatate (TESSALON) 200 MG capsule Take 1 capsule (200 mg total) by mouth 3 (three) times  daily as needed for cough. 20 capsule 0  . bifidobacterium infantis (ALIGN) capsule Take 1 capsule by mouth daily.    . Diphenhyd-Hydrocort-Nystatin (FIRST-DUKES MOUTHWASH) SUSP Use as directed 5 mLs in the mouth or throat 4 (four) times daily as needed (mouth pain). Gargle and Swallow    . fexofenadine (ALLEGRA) 180 MG tablet Take 180 mg by mouth daily.      . fluocinonide (LIDEX) 0.05 % external solution Apply 1 application topically daily as needed (scalp itching).     . Glucosamine-Chondroit-Vit C-Mn (GLUCOSAMINE CHONDR 1500 COMPLX PO) Take 1 tablet by mouth 3 (three) times daily.      [name] guaiFENesin (MUCINEX) 600 MG 12 hr tablet Take 1,200  mg by mouth 2 (two) times daily.    . hydrALAZINE (APRESOLINE) 50 MG tablet Take 1/2 tablet by mouth two times daily 30 tablet 6  . hyoscyamine (ANASPAZ) 0.125 MG TBDP disintergrating tablet Place 0.125 mg under the tongue every 6 (six) hours as needed for bladder spasms or cramping.    [name] ibuprofen (ADVIL,MOTRIN) 200 MG tablet Take 200 mg by mouth daily as needed (pain).    . isosorbide mononitrate (IMDUR) 30 MG 24 hr tablet Take 1 tablet (30 mg total) by mouth daily. 90 tablet 1  . ketoconazole (NIZORAL) 2 % shampoo Apply 1 application topically 2 (two) times a week. As needed for itching a couple of times week    . latanoprost (XALATAN) 0.005 % ophthalmic solution Place 1 drop into both eyes at bedtime.     [name] letrozole (FEMARA) 2.5 MG tablet Take 1 tablet (2.5 mg total) by mouth daily. 90 tablet 3  . losartan (COZAAR) 100 MG tablet Take 1/2 tablet by mouth two times daily 30 tablet 6  . metoprolol (LOPRESSOR) 50 MG tablet Take 1 tablet (50 mg total) by mouth 2 (two) times daily. 60 tablet 1  . mometasone (NASONEX) 50 MCG/ACT nasal spray Place 2 sprays into the nose daily as needed (congestion).     . nitroGLYCERIN (NITROSTAT) 0.4 MG SL tablet Place 0.4 mg under the tongue every 5 (five) minutes as needed for chest pain.    . Omega-3 Fatty Acids (FISH OIL) 1200 MG CAPS Take 1,200 mg by mouth daily.     [name] omeprazole (PRILOSEC) 40 MG capsule Take 1 capsule (40 mg total) by mouth daily. 30 minutes before 1st meal of the day 30 capsule 6  . tiZANidine (ZANAFLEX) 2 MG tablet Take 2 mg by mouth at bedtime.    . traMADol (ULTRAM) 50 MG tablet Take 1 tablet (50 mg total) by mouth 3 (three) times daily as needed. 90 tablet 5  . vitamin B-12 (CYANOCOBALAMIN) 1000 MCG tablet Take 1,000 mcg by mouth daily.       No current facility-administered medications for this visit.     Allergies:   Choline fenofibrate; Simvastatin; Hydrocodone; Levaquin; Adhesive; Ceclor; Clarithromycin; Codeine; Doxycycline;  Lisinopril; Penicillins; and Tobramycin-dexamethasone   Social History:  The patient  reports that she has never smoked. She has never used smokeless tobacco. She reports that she does not drink alcohol or use illicit drugs.   Family History:  The patient's family history includes Breast cancer in her sister; Cancer in her brother; Diabetes in her sister; Heart disease in her brother; Prostate cancer in her father; Stroke in her father and mother. There is no history of Colon cancer, Stomach cancer, Pancreatic cancer, Kidney disease, or Liver disease.   ROS:  Please see the history of present  illness.       All other systems reviewed and negative.    PHYSICAL EXAM: VS:  BP 164/80 mmHg  Pulse 62  Ht 4' 10.5" (1.486 m)  Wt 132 lb 3.2 oz (59.966 kg)  BMI 27.16 kg/m2  LMP 11/26/1990 Well nourished, well developed, in no acute distress HEENT: normal Neck: no JVD Cardiac:  normal S1, S2; RRR; no murmur Lungs:  clear to auscultation bilaterally, no wheezing, rhonchi or rales Abd: soft, nontender, no hepatomegaly Ext: no edema Skin: warm and dry Neuro:  CNs 2-12 intact, no focal abnormalities noted  EKG:  NSR, HR 62, normal axis, nonspecific ST-T wave changes, no change from prior tracings      ASSESSMENT AND PLAN:  1.  Essential hypertension: Blood pressure remains elevated. She has symptomatic with this. We discussed multiple options today including changing metoprolol to Toprol-XL, adding amlodipine or increasing hydralazine. She would prefer to continue with her same medications for now. She does admit to eating out ([employer]) since she became sick.    -  Increase hydralazine to 25 mg 3 times a day.    -  Consider increasing hydralazine to 50 mg 3 times a day if blood pressure remains elevated.    -  Consider adding amlodipine if blood pressure remains elevated.    -  Low-sodium diet. I discussed this with the patient today. 2.  Coronary artery disease:  No angina. Continue  aspirin, beta blocker, nitrates. She is intolerant to statins. 3.  Mixed hyperlipidemia: Intolerant of statins. 4.  Hyponatremia:  Resolved. Remain off of HCTZ.  Disposition:   FU with Dr. Lenna Gilford as planned and Dr. Jenkins Rouge in 02/2014.   Signed, Versie Starks, MHS 01/04/2014 11:47 AM    [facility] [place], [place], [place]  [address] Phone: [phone]; Fax: [phone]

## 2014-01-05 ENCOUNTER — Encounter (HOSPITAL_COMMUNITY): Payer: Self-pay

## 2014-01-05 ENCOUNTER — Emergency Department (HOSPITAL_COMMUNITY): Payer: Medicare Other

## 2014-01-05 ENCOUNTER — Telehealth: Payer: Self-pay | Admitting: Cardiovascular Disease

## 2014-01-05 ENCOUNTER — Observation Stay (HOSPITAL_COMMUNITY)
Admission: EM | Admit: 2014-01-05 | Discharge: 2014-01-06 | Disposition: A | Discharge status: Home / Self Care | Payer: Medicare Other | Attending: Cardiology | Admitting: Cardiology

## 2014-01-05 DIAGNOSIS — M549 Dorsalgia, unspecified: Secondary | ICD-10-CM | POA: Diagnosis not present

## 2014-01-05 DIAGNOSIS — N952 Postmenopausal atrophic vaginitis: Secondary | ICD-10-CM | POA: Insufficient documentation

## 2014-01-05 DIAGNOSIS — R079 Chest pain, unspecified: Principal | ICD-10-CM

## 2014-01-05 DIAGNOSIS — E871 Hypo-osmolality and hyponatremia: Secondary | ICD-10-CM | POA: Diagnosis not present

## 2014-01-05 DIAGNOSIS — K449 Diaphragmatic hernia without obstruction or gangrene: Secondary | ICD-10-CM | POA: Insufficient documentation

## 2014-01-05 DIAGNOSIS — Z853 Personal history of malignant neoplasm of breast: Secondary | ICD-10-CM | POA: Diagnosis not present

## 2014-01-05 DIAGNOSIS — R0989 Other specified symptoms and signs involving the circulatory and respiratory systems: Secondary | ICD-10-CM

## 2014-01-05 DIAGNOSIS — I10 Essential (primary) hypertension: Secondary | ICD-10-CM | POA: Insufficient documentation

## 2014-01-05 DIAGNOSIS — R072 Precordial pain: Secondary | ICD-10-CM | POA: Diagnosis not present

## 2014-01-05 DIAGNOSIS — I25701 Atherosclerosis of coronary artery bypass graft(s), unspecified, with angina pectoris with documented spasm: Secondary | ICD-10-CM

## 2014-01-05 DIAGNOSIS — E785 Hyperlipidemia, unspecified: Secondary | ICD-10-CM | POA: Insufficient documentation

## 2014-01-05 DIAGNOSIS — K5730 Diverticulosis of large intestine without perforation or abscess without bleeding: Secondary | ICD-10-CM | POA: Diagnosis not present

## 2014-01-05 DIAGNOSIS — Z880 Allergy status to penicillin: Secondary | ICD-10-CM | POA: Diagnosis not present

## 2014-01-05 DIAGNOSIS — Z8701 Personal history of pneumonia (recurrent): Secondary | ICD-10-CM | POA: Diagnosis not present

## 2014-01-05 DIAGNOSIS — R0789 Other chest pain: Secondary | ICD-10-CM

## 2014-01-05 DIAGNOSIS — I2510 Atherosclerotic heart disease of native coronary artery without angina pectoris: Secondary | ICD-10-CM | POA: Diagnosis present

## 2014-01-05 DIAGNOSIS — M797 Fibromyalgia: Secondary | ICD-10-CM | POA: Diagnosis not present

## 2014-01-05 DIAGNOSIS — Z86010 Personal history of colonic polyps: Secondary | ICD-10-CM | POA: Insufficient documentation

## 2014-01-05 DIAGNOSIS — H409 Unspecified glaucoma: Secondary | ICD-10-CM | POA: Insufficient documentation

## 2014-01-05 DIAGNOSIS — K589 Irritable bowel syndrome without diarrhea: Secondary | ICD-10-CM | POA: Diagnosis not present

## 2014-01-05 DIAGNOSIS — J45909 Unspecified asthma, uncomplicated: Secondary | ICD-10-CM | POA: Diagnosis present

## 2014-01-05 DIAGNOSIS — K2970 Gastritis, unspecified, without bleeding: Secondary | ICD-10-CM | POA: Diagnosis not present

## 2014-01-05 DIAGNOSIS — Z951 Presence of aortocoronary bypass graft: Secondary | ICD-10-CM | POA: Diagnosis not present

## 2014-01-05 DIAGNOSIS — K219 Gastro-esophageal reflux disease without esophagitis: Secondary | ICD-10-CM | POA: Diagnosis present

## 2014-01-05 DIAGNOSIS — E782 Mixed hyperlipidemia: Secondary | ICD-10-CM | POA: Diagnosis not present

## 2014-01-05 DIAGNOSIS — M4692 Unspecified inflammatory spondylopathy, cervical region: Secondary | ICD-10-CM | POA: Diagnosis not present

## 2014-01-05 DIAGNOSIS — K8020 Calculus of gallbladder without cholecystitis without obstruction: Secondary | ICD-10-CM | POA: Diagnosis not present

## 2014-01-05 DIAGNOSIS — E780 Pure hypercholesterolemia: Secondary | ICD-10-CM | POA: Insufficient documentation

## 2014-01-05 DIAGNOSIS — M25519 Pain in unspecified shoulder: Secondary | ICD-10-CM | POA: Diagnosis not present

## 2014-01-05 DIAGNOSIS — Z7982 Long term (current) use of aspirin: Secondary | ICD-10-CM | POA: Diagnosis not present

## 2014-01-05 DIAGNOSIS — Z79899 Other long term (current) drug therapy: Secondary | ICD-10-CM | POA: Insufficient documentation

## 2014-01-05 HISTORY — DX: Other specified symptoms and signs involving the circulatory and respiratory systems: R09.89

## 2014-01-05 LAB — BASIC METABOLIC PANEL
Anion gap: 17 — ABNORMAL HIGH (ref 5–15)
BUN: 13 mg/dL (ref 6–23)
CO2: 22 mEq/L (ref 19–32)
Calcium: 9.3 mg/dL (ref 8.4–10.5)
Chloride: 91 mEq/L — ABNORMAL LOW (ref 96–112)
Creatinine, Ser: 0.59 mg/dL (ref 0.50–1.10)
GFR calc non Af Amer: 87 mL/min — ABNORMAL LOW (ref >90)
GLUCOSE: 100 mg/dL — AB (ref 70–99)
POTASSIUM: 5.2 meq/L (ref 3.7–5.3)
SODIUM: 130 meq/L — AB (ref 137–147)

## 2014-01-05 LAB — TROPONIN I
Troponin I: <0.3 ng/mL (ref <0.30)
Troponin I: <0.3 ng/mL (ref <0.30)
Troponin I: <0.3 ng/mL (ref <0.30)

## 2014-01-05 LAB — I-STAT TROPONIN, ED: Troponin i, poc: 0.01 ng/mL (ref 0.00–0.08)

## 2014-01-05 LAB — CBC
HCT: 36.2 % (ref 36.0–46.0)
HEMOGLOBIN: 12.3 g/dL (ref 12.0–15.0)
MCH: 30.3 pg (ref 26.0–34.0)
MCHC: 34 g/dL (ref 30.0–36.0)
MCV: 89.2 fL (ref 78.0–100.0)
Platelets: 271 10*3/uL (ref 150–400)
RBC: 4.06 MIL/uL (ref 3.87–5.11)
RDW: 14.4 % (ref 11.5–15.5)
WBC: 10.9 10*3/uL — ABNORMAL HIGH (ref 4.0–10.5)

## 2014-01-05 MED ORDER — METOPROLOL TARTRATE 50 MG PO TABS
50.0000 mg | ORAL_TABLET | Freq: Two times a day (BID) | ORAL | Status: DC
  Administered 2014-01-05: 50 mg via ORAL
  Filled 2014-01-05 (×2): qty 1

## 2014-01-05 MED ORDER — BENZONATATE 100 MG PO CAPS: 200.0000 mg | ORAL_CAPSULE | Freq: Three times a day (TID) | ORAL | Status: DC | PRN

## 2014-01-05 MED ORDER — RISAQUAD PO CAPS
1.0000 | ORAL_CAPSULE | Freq: Every day | ORAL | Status: DC
  Filled 2014-01-05 (×2): qty 1

## 2014-01-05 MED ORDER — LORATADINE 10 MG PO TABS
10.0000 mg | ORAL_TABLET | Freq: Every day | ORAL | Status: DC
  Administered 2014-01-06: 10 mg via ORAL
  Filled 2014-01-05 (×2): qty 1

## 2014-01-05 MED ORDER — LATANOPROST 0.005 % OP SOLN
1.0000 [drp] | Freq: Every day | OPHTHALMIC | Status: DC
  Administered 2014-01-05: 1 [drp] via OPHTHALMIC
  Filled 2014-01-05: qty 2.5

## 2014-01-05 MED ORDER — ENOXAPARIN SODIUM 40 MG/0.4ML ~~LOC~~ SOLN
40.0000 mg | SUBCUTANEOUS | Status: DC
  Administered 2014-01-05: 40 mg via SUBCUTANEOUS
  Filled 2014-01-05: qty 0.4

## 2014-01-05 MED ORDER — VITAMIN B-12 1000 MCG PO TABS
1000.0000 ug | ORAL_TABLET | Freq: Every day | ORAL | Status: DC
  Administered 2014-01-06: 1000 ug via ORAL
  Filled 2014-01-05 (×2): qty 1

## 2014-01-05 MED ORDER — ZOLPIDEM TARTRATE 5 MG PO TABS: 5.0000 mg | ORAL_TABLET | Freq: Every evening | ORAL | Status: DC | PRN

## 2014-01-05 MED ORDER — FLUTICASONE PROPIONATE 50 MCG/ACT NA SUSP
1.0000 | Freq: Every day | NASAL | Status: DC
  Filled 2014-01-05: qty 16

## 2014-01-05 MED ORDER — LOSARTAN POTASSIUM 50 MG PO TABS
50.0000 mg | ORAL_TABLET | Freq: Two times a day (BID) | ORAL | Status: DC
  Administered 2014-01-06: 50 mg via ORAL
  Filled 2014-01-05: qty 1

## 2014-01-05 MED ORDER — NITROGLYCERIN 0.4 MG SL SUBL
0.4000 mg | SUBLINGUAL_TABLET | SUBLINGUAL | Status: AC | PRN
  Administered 2014-01-05: 0.4 mg via SUBLINGUAL
  Filled 2014-01-05: qty 1

## 2014-01-05 MED ORDER — TIZANIDINE HCL 2 MG PO TABS
2.0000 mg | ORAL_TABLET | Freq: Every day | ORAL | Status: DC
  Administered 2014-01-05: 2 mg via ORAL
  Filled 2014-01-05 (×3): qty 1

## 2014-01-05 MED ORDER — MORPHINE SULFATE 4 MG/ML IJ SOLN: 4.0000 mg | Freq: Once | INTRAMUSCULAR | Status: DC

## 2014-01-05 MED ORDER — LOSARTAN POTASSIUM 50 MG PO TABS
50.0000 mg | ORAL_TABLET | Freq: Two times a day (BID) | ORAL | Status: DC
  Filled 2014-01-05 (×2): qty 1

## 2014-01-05 MED ORDER — ALPRAZOLAM 0.25 MG PO TABS: 0.2500 mg | ORAL_TABLET | Freq: Two times a day (BID) | ORAL | Status: DC | PRN

## 2014-01-05 MED ORDER — MORPHINE SULFATE 4 MG/ML IJ SOLN
4.0000 mg | Freq: Once | INTRAMUSCULAR | Status: AC
  Administered 2014-01-05: 4 mg via INTRAVENOUS
  Filled 2014-01-05: qty 1

## 2014-01-05 MED ORDER — HYDRALAZINE HCL 50 MG PO TABS
50.0000 mg | ORAL_TABLET | Freq: Three times a day (TID) | ORAL | Status: DC
  Administered 2014-01-05 – 2014-01-06 (×2): 50 mg via ORAL
  Filled 2014-01-05 (×3): qty 1

## 2014-01-05 MED ORDER — ASPIRIN EC 81 MG PO TBEC
81.0000 mg | DELAYED_RELEASE_TABLET | Freq: Every day | ORAL | Status: DC
  Administered 2014-01-06: 81 mg via ORAL
  Filled 2014-01-05 (×2): qty 1

## 2014-01-05 MED ORDER — ONDANSETRON HCL 4 MG/2ML IJ SOLN: 4.0000 mg | Freq: Four times a day (QID) | INTRAMUSCULAR | Status: DC | PRN

## 2014-01-05 MED ORDER — ONDANSETRON HCL 4 MG/2ML IJ SOLN
4.0000 mg | Freq: Once | INTRAMUSCULAR | Status: AC
  Administered 2014-01-05: 4 mg via INTRAVENOUS
  Filled 2014-01-05: qty 2

## 2014-01-05 MED ORDER — MORPHINE SULFATE 2 MG/ML IJ SOLN: 2.0000 mg | INTRAMUSCULAR | Status: DC | PRN

## 2014-01-05 MED ORDER — ASPIRIN 81 MG PO CHEW
324.0000 mg | CHEWABLE_TABLET | Freq: Once | ORAL | Status: AC
  Administered 2014-01-05: 324 mg via ORAL
  Filled 2014-01-05: qty 4

## 2014-01-05 MED ORDER — ALIGN PO CAPS: 1.0000 | ORAL_CAPSULE | Freq: Every day | ORAL | Status: DC

## 2014-01-05 MED ORDER — LETROZOLE 2.5 MG PO TABS
2.5000 mg | ORAL_TABLET | Freq: Every day | ORAL | Status: DC
  Administered 2014-01-06: 2.5 mg via ORAL
  Filled 2014-01-05 (×2): qty 1

## 2014-01-05 MED ORDER — ISOSORBIDE MONONITRATE ER 60 MG PO TB24
60.0000 mg | ORAL_TABLET | Freq: Every day | ORAL | Status: DC
  Administered 2014-01-06: 60 mg via ORAL
  Filled 2014-01-05 (×2): qty 1

## 2014-01-05 MED ORDER — TRAMADOL HCL 50 MG PO TABS: 50.0000 mg | ORAL_TABLET | Freq: Three times a day (TID) | ORAL | Status: DC | PRN

## 2014-01-05 MED ORDER — ACETAMINOPHEN 325 MG PO TABS
650.0000 mg | ORAL_TABLET | ORAL | Status: DC | PRN
Start: 2014-01-05 — End: 2014-01-06
  Administered 2014-01-06: 650 mg via ORAL
  Filled 2014-01-05: qty 2

## 2014-01-05 MED ORDER — PANTOPRAZOLE SODIUM 40 MG PO TBEC
40.0000 mg | DELAYED_RELEASE_TABLET | Freq: Every day | ORAL | Status: DC
  Administered 2014-01-06: 40 mg via ORAL
  Filled 2014-01-05: qty 1

## 2014-01-05 NOTE — Telephone Encounter (Signed)
New message          C/o pain in her back between her shoulders / weakness / headache / please give pt a call

## 2014-01-05 NOTE — ED Notes (Signed)
Pt off the floor

## 2014-01-05 NOTE — ED Provider Notes (Signed)
CSN: [id number]     Arrival date & time 01/05/14  1243 History   First MD Initiated Contact with Patient 01/05/14 1258     Chief Complaint  Patient presents with  . Chest Pain  . Back Pain     (Consider location/radiation/quality/duration/timing/severity/associated sxs/prior Treatment) HPI   PCP: Noralee Space, MD Cardiologist: Dr. Jenkins Rouge   Thepatient has a PMH of Open heart surgery in 2011, coronary artery bypass graft, stent placed in 2007, left sided breast cancer, GERD to the ER presents to the ED with complaints of Chest "pressure" and  back pain. She was admitted on 01/03/2014 for hyponatremia and hypertension. She was recently discharged and since has been having on again, off again. She is also having a headache and her BP is elevated at 174/75. She reports calling her PMD and they told her to take nitro and call 9-1-1. She has had 2 nitroglycerins and said that they did not help her pain at all. She currently is having pain at a 8/10. No SOB, diaphoresis, nausea, weakness, LE swelling, abdominal pain or swelling, neck pain, fevers, cough or other associated symptoms.  Past Medical History  Diagnosis Date  . Unspecified glaucoma   . Allergic rhinitis, cause unspecified   . Unspecified asthma(493.90)   . Unspecified essential hypertension   . Other and unspecified hyperlipidemia   . Gastritis   . Abdominal pain, left lower quadrant   . Diverticulosis of colon (without mention of hemorrhage)   . Irritable bowel syndrome   . Colonic polyp 04-27-2009    tubular adenoma  . Headache(784.0)   . Dizziness   . Breast cancer, Left 12/20/2010  . Gallstones   . CAD (coronary artery disease)     a. 01/19/2010 s/p CABG x 3, lima->lad, vg->diag, vg->om1;  b. 06/2011 :Lexi MV: EF 84%, No ischemia.  . Bronchitis     hx of  . Pneumonia   . Hiatal hernia   . Fibromyalgia   . Esophageal reflux     hiatal hernia  . Atrophic vaginitis   . GERD (gastroesophageal reflux  disease)   . Hypercholesterolemia   . Breast cancer   . Glaucoma   . Cervical arthritis    Past Surgical History  Procedure Laterality Date  . Coronary angioplasty with stent placement      Stent 2007  . Open heart surgery      01/19/2010  . Coronary artery bypass graft    . Cataract extraction, bilateral      bilateral caaract removal,  . Breast lumpectomy Left 02/06/11   Family History  Problem Relation Age of Onset  . Heart disease Brother   . Cancer Brother     gland cancer  . Diabetes Sister   . Breast cancer Sister   . Stroke Mother   . Stroke Father   . Colon cancer Neg Hx   . Stomach cancer Neg Hx   . Pancreatic cancer Neg Hx   . Prostate cancer Father   . Kidney disease Neg Hx   . Liver disease Neg Hx    History  Substance Use Topics  . Smoking status: Never Smoker   . Smokeless tobacco: Never Used  . Alcohol Use: No   OB History    Gravida Para Term Preterm AB TAB SAB Ectopic Multiple Living   3 2   1     2      Review of Systems  10 Systems reviewed and are negative for acute change except  as noted in the HPI.     Allergies  Choline fenofibrate; Simvastatin; Hydrocodone; Levaquin; Adhesive; Ceclor; Clarithromycin; Codeine; Doxycycline; Lisinopril; Penicillins; and Tobramycin-dexamethasone  Home Medications   Prior to Admission medications   Medication Sig Start Date End Date Taking? Authorizing Provider  aspirin EC 81 MG tablet Take 81 mg by mouth daily.    Historical Provider, MD  benzonatate (TESSALON) 200 MG capsule Take 1 capsule (200 mg total) by mouth 3 (three) times daily as needed for cough. 12/27/13   Samella Parr, NP  bifidobacterium infantis (ALIGN) capsule Take 1 capsule by mouth daily.    Historical Provider, MD  Diphenhyd-Hydrocort-Nystatin (FIRST-DUKES MOUTHWASH) SUSP Use as directed 5 mLs in the mouth or throat 4 (four) times daily as needed (mouth pain). Gargle and Swallow    Historical Provider, MD  fexofenadine (ALLEGRA) 180  MG tablet Take 180 mg by mouth daily.      Historical Provider, MD  fluocinonide (LIDEX) 0.05 % external solution Apply 1 application topically daily as needed (scalp itching).  04/29/13   Historical Provider, MD  Glucosamine-Chondroit-Vit C-Mn (GLUCOSAMINE CHONDR 1500 COMPLX PO) Take 1 tablet by mouth 3 (three) times daily.      Historical Provider, MD  guaiFENesin (MUCINEX) 600 MG 12 hr tablet Take 1,200 mg by mouth 2 (two) times daily.    Historical Provider, MD  hydrALAZINE (APRESOLINE) 50 MG tablet Take 1/2 tablet by mouth three times daily 01/04/14   Liliane Shi, PA-C  hyoscyamine (ANASPAZ) 0.125 MG TBDP disintergrating tablet Place 0.125 mg under the tongue every 6 (six) hours as needed for bladder spasms or cramping.    Historical Provider, MD  ibuprofen (ADVIL,MOTRIN) 200 MG tablet Take 200 mg by mouth daily as needed (pain).    Historical Provider, MD  isosorbide mononitrate (IMDUR) 30 MG 24 hr tablet Take 1 tablet (30 mg total) by mouth daily. 12/13/13   Josue Hector, MD  ketoconazole (NIZORAL) 2 % shampoo Apply 1 application topically 2 (two) times a week. As needed for itching a couple of times week 04/29/13   Historical Provider, MD  latanoprost (XALATAN) 0.005 % ophthalmic solution Place 1 drop into both eyes at bedtime.  07/31/12   Historical Provider, MD  letrozole (FEMARA) 2.5 MG tablet Take 1 tablet (2.5 mg total) by mouth daily. 07/27/13   Amy Milda Smart, PA-C  losartan (COZAAR) 100 MG tablet Take 1/2 tablet by mouth two times daily 12/30/13   Noralee Space, MD  metoprolol (LOPRESSOR) 50 MG tablet Take 1 tablet (50 mg total) by mouth 2 (two) times daily. 12/30/13   Josue Hector, MD  mometasone (NASONEX) 50 MCG/ACT nasal spray Place 2 sprays into the nose daily as needed (congestion).     Historical Provider, MD  nitroGLYCERIN (NITROSTAT) 0.4 MG SL tablet Place 0.4 mg under the tongue every 5 (five) minutes as needed for chest pain.    Historical Provider, MD  Omega-3 Fatty Acids (FISH  OIL) 1200 MG CAPS Take 1,200 mg by mouth daily.     Historical Provider, MD  omeprazole (PRILOSEC) 40 MG capsule Take 1 capsule (40 mg total) by mouth daily. 30 minutes before 1st meal of the day 12/30/13   Noralee Space, MD  tiZANidine (ZANAFLEX) 2 MG tablet Take 2 mg by mouth at bedtime.    Historical Provider, MD  traMADol (ULTRAM) 50 MG tablet Take 1 tablet (50 mg total) by mouth 3 (three) times daily as needed. 12/30/13   Deborra Medina  Lenna Gilford, MD  vitamin B-12 (CYANOCOBALAMIN) 1000 MCG tablet Take 1,000 mcg by mouth daily.      Historical Provider, MD   BP 159/65 mmHg  Pulse 63  Temp(Src) 99 F (37.2 C) (Oral)  Resp 17  Ht 4' 10.5" (1.486 m)  Wt 132 lb (59.875 kg)  BMI 27.11 kg/m2  SpO2 99%  LMP 11/26/1990 Physical Exam  Constitutional: She is oriented to person, place, and time. She appears well-developed and well-nourished. No distress.  HENT:  Head: Normocephalic and atraumatic.  Eyes: Pupils are equal, round, and reactive to light.  Neck: Normal range of motion. Neck supple.  Cardiovascular: Normal rate and regular rhythm.   Pulmonary/Chest: Effort normal and breath sounds normal. No accessory muscle usage. She exhibits no tenderness, no deformity and no retraction.  Abdominal: Soft. Bowel sounds are normal. There is no tenderness. There is no rebound.  Neurological: She is alert and oriented to person, place, and time.  Cranial nerves II-VIII and X-XII evaluated and show no deficits. Pt alert and oriented x 3 Upper and lower extremity strength is symmetrical and physiologic Normal muscular tone No facial droop Coordination intact, no limb ataxia  Skin: Skin is warm and dry.  Nursing note and vitals reviewed.     ED Course  Procedures (including critical care time) Labs Review Labs Reviewed  CBC - Abnormal; Notable for the following:    WBC 10.9 (*)    All other components within normal limits  BASIC METABOLIC PANEL - Abnormal; Notable for the following:    Sodium 130  (*)    Chloride 91 (*)    Glucose, Bld 100 (*)    GFR calc non Af Amer 87 (*)    Anion gap 17 (*)    All other components within normal limits  TROPONIN I  TROPONIN I  I-STAT TROPOININ, ED    Imaging Review Dg Chest 2 View  01/05/2014   CLINICAL DATA:  Chest pain, back pain  EXAM: CHEST  2 VIEW  COMPARISON:  None.  FINDINGS: There is no focal parenchymal opacity, pleural effusion, or pneumothorax. The heart and mediastinal contours are unremarkable. There is evidence of prior CABG.  There is mild thoracic spine spondylosis.  IMPRESSION: No active cardiopulmonary disease.   Electronically Signed   By: Kathreen Devoid   On: 01/05/2014 14:22     EKG Interpretation   Date/Time:  Wednesday January 05 2014 12:47:20 EST Ventricular Rate:  64 PR Interval:  168 QRS Duration: 68 QT Interval:  408 QTC Calculation: 420 R Axis:   1 Text Interpretation:  Normal sinus rhythm Possible Left atrial enlargement  Borderline ECG Similar to prior8 Confirmed by Mingo Amber  MD, [place] ([id number]) on  01/05/2014 1:27:48 PM      MDM   Final diagnoses:  Hyponatremia  Chest pain, unspecified chest pain type    Pt has had 2 nitro and 4mg  IV Morphine without relief of pain. 7/10- worse in the back Neg chest and Trop, no acute changes to EKG.  2:46 pm I spoke with Trish with Cardiology, they will consult on pt for further recommendations. 1 more nitro and 4mg  IV Morphine ordered to gain better control of pain.   Linus Mako, PA-C 01/05/14 [place], MD 01/06/14 [phone]

## 2014-01-05 NOTE — ED Notes (Signed)
Cardiology at bedside.

## 2014-01-05 NOTE — Telephone Encounter (Signed)
Pt states she had similar pain prior to CABG. I advised pt to call 911 and take NTG immediately.  Pt told me she would hang up the phone call 911 and take NTG.

## 2014-01-05 NOTE — Consult Note (Signed)
CARDIOLOGY CONSULT NOTE   Patient ID: [name] MRN: [id number] DOB/AGE: 04/28/1935 78 y.o.  Admit date: 01/05/2014  Primary Physician   Noralee Space, MD Primary Cardiologist   Dr Johnsie Cancel Reason for Consultation   Chest pain  LPF:[id number] [name] is a 78 y.o. female with a history of CAD, CABG 2011, HTN, HLD, HA, fibromyalgia, IBS, and GERD. Last ischemic eval was a Lexiscan stress test in 2013 (done for chest pain).  She was admitted 10/29-11/02 with HA due to hypertensive emergency (SBP > 220), and hyponatremia (Na + 119). HCTZ was stopped and her sodium improved. She may have been taking an OTC medication with pseudoephedrine.   She was seen in the ER on 11/04 with HTN, SBP > 210. Discharged from the ER.  Seen by Dr. Lenna Gilford 11/05, BP meds increased with addition of hydralazine 25 mg BID, increased Losartan 50 mg-->100 mg daily.  ED visit 11/09 with HA, BP 212/92, MD suggested she stagger meds for better 24 hour coverage, no med changes made.   She was seen in the office by Richardson Dopp on 11/10 and her BP was 164/80. Her hydralazine was increased from 25 mg bid to 25 mg tid. No complaints of chest pain or back pain.  She has been having intermittent interscapular pain plus chest pressure. Sometimes the back pain will come alone, sometimes the chest pressure goes through to her back.   She has had URI x about 3 weeks with cough/cold symptoms, yellowish sputum. She was placed on Levaquin by Dr. Lenna Gilford on 10/27, but did not tolerate it, with vomiting. She has been coughing frequently, but the cough is not always associated with the chest pain. No fevers or chills and the cough is gradually getting better.   Last pm, the chest pain started and would wax and wane but did not resolve. There is no associated N&V, SOB or diaphoresis. Today, the chest pain worsened, up to a 9/10. She took Tylenol, 2 SL NTG with no relief, so came to the ER. She was given morphine 4 mg and  Zofran 4 mg in the ER. She feels the morphine improved the pain, now a 5/10. It has not been a 0/10 since yesterday. The pain reminds her of her pre-PCI symptoms.    Past Medical History  Diagnosis Date  . Unspecified glaucoma   . Allergic rhinitis, cause unspecified   . Unspecified asthma(493.90)   . Unspecified essential hypertension   . Other and unspecified hyperlipidemia   . Gastritis   . Abdominal pain, left lower quadrant   . Diverticulosis of colon (without mention of hemorrhage)   . Irritable bowel syndrome   . Colonic polyp 04-27-2009    tubular adenoma  . Headache(784.0)   . Dizziness   . Breast cancer, Left 12/20/2010  . Gallstones   . CAD (coronary artery disease)     a. 01/19/2010 s/p CABG x 3, lima->lad, vg->diag, vg->om1;  b. 06/2011 :Lexi MV: EF 84%, No ischemia.  . Bronchitis     hx of  . Pneumonia   . Hiatal hernia   . Fibromyalgia   . Esophageal reflux     hiatal hernia  . Atrophic vaginitis   . GERD (gastroesophageal reflux disease)   . Hypercholesterolemia   . Breast cancer   . Glaucoma   . Cervical arthritis      Past Surgical History  Procedure Laterality Date  . Coronary angioplasty with stent placement  Stent 2007  . Open heart surgery      01/19/2010  . Coronary artery bypass graft    . Cataract extraction, bilateral      bilateral caaract removal,  . Breast lumpectomy Left 02/06/11    Allergies  Allergen Reactions  . Choline Fenofibrate Other (See Comments)     pt states INTOL to Trilipix w/ "thigh burning"  . Simvastatin Other (See Comments)     pt states INTOL to STATINS \\T \ refuses to restart  . Hydrocodone     Nightmare after taking cough syrup w/hydrocodone  . Levaquin [Levofloxacin In D5w]     Elevated BP  . Adhesive [Tape] Rash  . Ceclor [Cefaclor] Rash  . Clarithromycin Rash  . Codeine Nausea Only  . Doxycycline Rash  . Lisinopril Cough    Developed ACE cough...  . Penicillins Itching and Rash    At injection site   . Tobramycin-Dexamethasone Rash    I have reviewed the patient's current medications .  morphine injection  4 mg Intravenous Once   Medication Sig  aspirin EC 81 MG tablet Take 81 mg by mouth daily.  benzonatate (TESSALON) 200 MG capsule Take 1 capsule (200 mg total) by mouth 3 (three) times daily as needed for cough.  bifidobacterium infantis (ALIGN) capsule Take 1 capsule by mouth daily.  Diphenhyd-Hydrocort-Nystatin (FIRST-DUKES MOUTHWASH) SUSP Use as directed 5 mLs in the mouth or throat 4 (four) times daily as needed (mouth pain). Gargle and Swallow  fexofenadine (ALLEGRA) 180 MG tablet Take 180 mg by mouth daily.    fluocinonide (LIDEX) 0.05 % external solution Apply 1 application topically daily as needed (scalp itching).   Glucosamine-Chondroit-Vit C-Mn (GLUCOSAMINE CHONDR 1500 COMPLX PO) Take 1 tablet by mouth 3 (three) times daily.    guaiFENesin (MUCINEX) 600 MG 12 hr tablet Take 1,200 mg by mouth 2 (two) times daily.  hydrALAZINE (APRESOLINE) 50 MG tablet Take 1/2 tablet by mouth three times daily  hyoscyamine (ANASPAZ) 0.125 MG TBDP disintergrating tablet Place 0.125 mg under the tongue every 6 (six) hours as needed for bladder spasms or cramping.  ibuprofen (ADVIL,MOTRIN) 200 MG tablet Take 200 mg by mouth daily as needed (pain).  isosorbide mononitrate (IMDUR) 30 MG 24 hr tablet Take 1 tablet (30 mg total) by mouth daily.  ketoconazole (NIZORAL) 2 % shampoo Apply 1 application topically 2 (two) times a week. As needed for itching a couple of times week  latanoprost (XALATAN) 0.005 % ophthalmic solution Place 1 drop into both eyes at bedtime.   letrozole (FEMARA) 2.5 MG tablet Take 1 tablet (2.5 mg total) by mouth daily.  losartan (COZAAR) 100 MG tablet Take 1/2 tablet by mouth two times daily  metoprolol (LOPRESSOR) 50 MG tablet Take 1 tablet (50 mg total) by mouth 2 (two) times daily.  mometasone (NASONEX) 50 MCG/ACT nasal spray Place 2 sprays into the nose daily as needed  (congestion).   nitroGLYCERIN (NITROSTAT) 0.4 MG SL tablet Place 0.4 mg under the tongue every 5 (five) minutes as needed for chest pain.  Omega-3 Fatty Acids (FISH OIL) 1200 MG CAPS Take 1,200 mg by mouth daily.   omeprazole (PRILOSEC) 40 MG capsule Take 1 capsule (40 mg total) by mouth daily. 30 minutes before 1st meal of the day  tiZANidine (ZANAFLEX) 2 MG tablet Take 2 mg by mouth at bedtime.  traMADol (ULTRAM) 50 MG tablet Take 1 tablet (50 mg total) by mouth 3 (three) times daily as needed.  vitamin B-12 (CYANOCOBALAMIN) 1000 MCG tablet  Take 1,000 mcg by mouth daily.       History   Social History  . Marital Status: Married    Spouse Name: Gildardo Griffes. [place]    Number of Children: 2  . Years of Education: 12   Occupational History  . part time preschool teacher-retired    Social History Main Topics  . Smoking status: Never Smoker   . Smokeless tobacco: Never Used  . Alcohol Use: No  . Drug Use: No  . Sexual Activity: Yes    Birth Control/ Protection: Post-menopausal   Other Topics Concern  . Not on file   Social History Narrative   Patient lives at home with her husband Marcello Moores). Patient is retired. Patient  Has 12 th grade education.    Caffeine- sometimes- One cup of coffee.   Right handed.   Four granddaughters.    Family Status  Relation Status Death Age  . Mother Deceased 91  . Father Deceased 14    prostate cancer  . Sister Alive     breast cancer, uterine cancer,DM  . Brother Alive     thyroid cancer,DM   Family History  Problem Relation Age of Onset  . Heart disease Brother   . Cancer Brother     gland cancer  . Diabetes Sister   . Breast cancer Sister   . Stroke Mother   . Stroke Father   . Colon cancer Neg Hx   . Stomach cancer Neg Hx   . Pancreatic cancer Neg Hx   . Prostate cancer Father   . Kidney disease Neg Hx   . Liver disease Neg Hx      ROS: No fevers or chills. Full 14 point review of systems complete and found to be negative  unless listed above.  Physical Exam: Blood pressure 159/65, pulse 63, temperature 99 F (37.2 C), temperature source Oral, resp. rate 17, height 4' 10.5" (1.486 m), weight 132 lb (59.875 kg), last menstrual period 11/26/1990, SpO2 99 %.  General: Well developed, well nourished, female in no acute distress Head: Eyes PERRLA, No xanthomas.   Normocephalic and atraumatic, oropharynx without edema or exudate. Dentition: good Lungs: few rales, good air exchange Heart: HRRR S1 S2, no rub/gallop, no murmur. pulses are 2+ all 4 extrem.   Neck: No carotid bruits. No lymphadenopathy.  JVD not elevated. Abdomen: Bowel sounds present, abdomen soft and non-tender without masses or hernias noted. Msk:  No spine or cva tenderness. No weakness, no joint deformities or effusions. Extremities: No clubbing or cyanosis. no edema.  Neuro: Alert and oriented X 3. No focal deficits noted. Psych:  Good affect, responds appropriately Skin: No rashes or lesions noted.  Labs:   Lab Results  Component Value Date   WBC 10.9* 01/05/2014   HGB 12.3 01/05/2014   HCT 36.2 01/05/2014   MCV 89.2 01/05/2014   PLT 271 01/05/2014     Recent Labs Lab 01/05/14 1348  NA 130*  K 5.2  CL 91*  CO2 22  BUN 13  CREATININE 0.59  CALCIUM 9.3  GLUCOSE 100*    Recent Labs  01/05/14 1350 01/05/14 1602  TROPONINI <0.30 <0.30    Recent Labs  01/05/14 1403  TROPIPOC 0.01   Lab Results  Component Value Date   CHOL 194 04/06/2012   HDL 55.10 04/06/2012   LDLCALC 110* 04/06/2012   TRIG 143.0 04/06/2012   TSH  Date/Time Value Ref Range Status  12/24/2013 04:00 PM 1.600 0.350 - 4.500 uIU/mL Final  ECG: 01/05/2014 SR, no acute ischemic changes  Radiology:  Dg Chest 2 View 01/05/2014   CLINICAL DATA:  Chest pain, back pain  EXAM: CHEST  2 VIEW  COMPARISON:  None.  FINDINGS: There is no focal parenchymal opacity, pleural effusion, or pneumothorax. The heart and mediastinal contours are unremarkable. There is  evidence of prior CABG.  There is mild thoracic spine spondylosis.  IMPRESSION: No active cardiopulmonary disease.   Electronically Signed   By: Kathreen Devoid   On: 01/05/2014 14:22    ASSESSMENT AND PLAN:   The patient was seen today by Dr Meda Coffee, the patient evaluated and the data reviewed.  Principal Problem:   Chest pain, midsternal - prolonged > 12 hours without enzyme elevation or ECG changes. However, she likens it to pre-PCI symptoms. MD advise on admission to observation, continue to cycle enzymes and consider nuclear stress test in am if enzymes remain negative. Echo in am.  Active Problems:   Mixed hyperlipidemia - intolerant to statins, ck profile in am    Essential hypertension - on 11/08, BP 212/92. Highest BP 174/75. Can increase hydralazine to 50 mg bid, increase Imdur 30 mg-->60 mg daily.     Hyponatremia - Na+ 119 on 10/30, improved since then. Follow. Ck aldosterone + renin/angiotensin ratio.  Plan - admit overnight observation, testing in am, possible d/c tomorrow pm if does well.   SignedRosaria Ferries, PA-C 01/05/2014 4:50 PM Beeper [phone]  Co-Sign MD   The patient was seen, examined and discussed with Rosaria Ferries, PA-C and I agree with the above.   78 year old, patient of Dr Johnsie Cancel, last seen by Richardson Dopp yesterday for chest pain and labile HTN. H/o CAD, s/p CABG in 2011, negative stress test 2011. We will investigate for hyperaldosteronism (PRA), renal artery stenosis, intensify BP control with increasing hydralazine to 50 TID and imdur to 60 daily. We will order echo (none in epic) for the evaluation of systolic, diastolic LV function, degree of LVH and filling pressures. If persistent chest pain we will consider stress test. The pain is atypical, constant, associated with elevated BP, this is possibly microvascular disease secondary to poorly controlled HTN. NPO after midnight for possible stress test in the am.  Dorothy Spark 01/05/2014

## 2014-01-05 NOTE — Telephone Encounter (Signed)
Pt states she has had pain between her shoulder blades for about 2 hours this morning.  It is associated with a little bit of nausea, but no other symptoms.

## 2014-01-05 NOTE — ED Notes (Signed)
Pt here for right arm pain and numbness and chest pain and back pain, pt complains of htn and headache as well, call pmd and took ntg and no relief.

## 2014-01-06 ENCOUNTER — Encounter (HOSPITAL_COMMUNITY): Payer: Self-pay | Admitting: Physician Assistant

## 2014-01-06 ENCOUNTER — Observation Stay (HOSPITAL_COMMUNITY): Payer: Medicare Other

## 2014-01-06 DIAGNOSIS — R0989 Other specified symptoms and signs involving the circulatory and respiratory systems: Secondary | ICD-10-CM | POA: Diagnosis present

## 2014-01-06 DIAGNOSIS — R079 Chest pain, unspecified: Secondary | ICD-10-CM | POA: Diagnosis not present

## 2014-01-06 DIAGNOSIS — M797 Fibromyalgia: Secondary | ICD-10-CM | POA: Diagnosis present

## 2014-01-06 DIAGNOSIS — R072 Precordial pain: Secondary | ICD-10-CM | POA: Diagnosis not present

## 2014-01-06 LAB — TROPONIN I: Troponin I: <0.3 ng/mL (ref <0.30)

## 2014-01-06 MED ORDER — HYDRALAZINE HCL 50 MG PO TABS: 50.0000 mg | ORAL_TABLET | Freq: Three times a day (TID) | ORAL | Status: DC

## 2014-01-06 MED ORDER — REGADENOSON 0.4 MG/5ML IV SOLN
0.4000 mg | Freq: Once | INTRAVENOUS | Status: AC
  Administered 2014-01-06: 0.4 mg via INTRAVENOUS

## 2014-01-06 MED ORDER — SODIUM CHLORIDE 0.9 % IJ SOLN
80.0000 mg | INTRAVENOUS | Status: AC
  Administered 2014-01-06: 80 mg via INTRAVENOUS

## 2014-01-06 MED ORDER — TECHNETIUM TC 99M SESTAMIBI GENERIC - CARDIOLITE
30.0000 | Freq: Once | INTRAVENOUS | Status: AC | PRN
  Administered 2014-01-06: 30 via INTRAVENOUS

## 2014-01-06 MED ORDER — REGADENOSON 0.4 MG/5ML IV SOLN
INTRAVENOUS | Status: AC
  Administered 2014-01-06: 0.4 mg via INTRAVENOUS
  Filled 2014-01-06: qty 5

## 2014-01-06 MED ORDER — TECHNETIUM TC 99M SESTAMIBI GENERIC - CARDIOLITE
10.0000 | Freq: Once | INTRAVENOUS | Status: AC | PRN
  Administered 2014-01-06: 10 via INTRAVENOUS

## 2014-01-06 MED ORDER — ISOSORBIDE MONONITRATE ER 60 MG PO TB24: 60.0000 mg | ORAL_TABLET | Freq: Every day | ORAL | Status: DC

## 2014-01-06 NOTE — Progress Notes (Addendum)
Lexiscan nuc completed. Pt developed flushing initially. VSS - BP 287-681L systolic.  She then developed sensation of chest pressure going through to her back similar to admission. EKG showed ST depression in II, avF, V3-V6 up to 1 mm. No arrhythmias. Symptoms did not improve by 8 minutes so aminophylline 80mg  given with gradual relief of chest discomfort and improvement in EKG.  Picture of mid-test EKG as below. Jameson Morrow PA-C

## 2014-01-06 NOTE — Discharge Instructions (Signed)
Hydralazine tablets What is this medicine? HYDRALAZINE (hye DRAL a zeen) is a type of vasodilator. It relaxes blood vessels, increasing the blood and oxygen supply to your heart. This medicine is used to treat high blood pressure. This medicine may be used for other purposes; ask your health care provider or pharmacist if you have questions. COMMON BRAND NAME(S): Apresoline What should I tell my health care provider before I take this medicine? They need to know if you have any of these conditions: -blood vessel disease -heart disease including angina or history of heart attack -kidney or liver disease -systemic lupus erythematosus (SLE) -an unusual or allergic reaction to hydralazine, tartrazine dye, other medicines, foods, dyes, or preservatives -pregnant or trying to get pregnant -breast-feeding How should I use this medicine? Take this medicine by mouth with a glass of water. Follow the directions on the prescription label. Take your doses at regular intervals. Do not take your medicine more often than directed. Do not stop taking except on the advice of your doctor or health care professional. Talk to your pediatrician regarding the use of this medicine in children. Special care may be needed. While this drug may be prescribed for children for selected conditions, precautions do apply. Overdosage: If you think you have taken too much of this medicine contact a poison control center or emergency room at once. NOTE: This medicine is only for you. Do not share this medicine with others. What if I miss a dose? If you miss a dose, take it as soon as you can. If it is almost time for your next dose, take only that dose. Do not take double or extra doses. What may interact with this medicine? -medicines for high blood pressure -medicines for mental depression This list may not describe all possible interactions. Give your health care provider a list of all the medicines, herbs, non-prescription  drugs, or dietary supplements you use. Also tell them if you smoke, drink alcohol, or use illegal drugs. Some items may interact with your medicine. What should I watch for while using this medicine? Visit your doctor or health care professional for regular checks on your progress. Check your blood pressure and pulse rate regularly. Ask your doctor or health care professional what your blood pressure and pulse rate should be and when you should contact him or her. You may get drowsy or dizzy. Do not drive, use machinery, or do anything that needs mental alertness until you know how this medicine affects you. Do not stand or sit up quickly, especially if you are an older patient. This reduces the risk of dizzy or fainting spells. Alcohol may interfere with the effect of this medicine. Avoid alcoholic drinks. Do not treat yourself for coughs, colds, or pain while you are taking this medicine without asking your doctor or health care professional for advice. Some ingredients may increase your blood pressure. What side effects may I notice from receiving this medicine? Side effects that you should report to your doctor or health care professional as soon as possible: -chest pain, or fast or irregular heartbeat -fever, chills, or sore throat -numbness or tingling in the hands or feet -shortness of breath -skin rash, redness, blisters or itching -stiff or swollen joints -sudden weight gain -swelling of the feet or legs -swollen lymph glands -unusual weakness Side effects that usually do not require medical attention (report to your doctor or health care professional if they continue or are bothersome): -diarrhea, or constipation -headache -loss of appetite -nausea, vomiting  This list may not describe all possible side effects. Call your doctor for medical advice about side effects. You may report side effects to FDA at 1-800-FDA-1088. Where should I keep my medicine? Keep out of the reach of  children. Store at room temperature between 15 and 30 degrees C (59 and 86 degrees F). Throw away any unused medicine after the expiration date. NOTE: This sheet is a summary. It may not cover all possible information. If you have questions about this medicine, talk to your doctor, pharmacist, or health care provider.  2015, [employer]/Gold Standard. (2007-06-26 15:44:58) Hypertension Hypertension, commonly called high blood pressure, is when the force of blood pumping through your arteries is too strong. Your arteries are the blood vessels that carry blood from your heart throughout your body. A blood pressure reading consists of a higher number over a lower number, such as 110/72. The higher number (systolic) is the pressure inside your arteries when your heart pumps. The lower number (diastolic) is the pressure inside your arteries when your heart relaxes. Ideally you want your blood pressure below 120/80. Hypertension forces your heart to work harder to pump blood. Your arteries may become narrow or stiff. Having hypertension puts you at risk for heart disease, stroke, and other problems.  RISK FACTORS Some risk factors for high blood pressure are controllable. Others are not.  Risk factors you cannot control include:   Race. You may be at higher risk if you are African American.  Age. Risk increases with age.  Gender. Men are at higher risk than women before age 53 years. After age 56, women are at higher risk than men. Risk factors you can control include:  Not getting enough exercise or physical activity.  Being overweight.  Getting too much fat, sugar, calories, or salt in your diet.  Drinking too much alcohol. SIGNS AND SYMPTOMS Hypertension does not usually cause signs or symptoms. Extremely high blood pressure (hypertensive crisis) may cause headache, anxiety, shortness of breath, and nosebleed. DIAGNOSIS  To check if you have hypertension, your health care provider will measure  your blood pressure while you are seated, with your arm held at the level of your heart. It should be measured at least twice using the same arm. Certain conditions can cause a difference in blood pressure between your right and left arms. A blood pressure reading that is higher than normal on one occasion does not mean that you need treatment. If one blood pressure reading is high, ask your health care provider about having it checked again. TREATMENT  Treating high blood pressure includes making lifestyle changes and possibly taking medicine. Living a healthy lifestyle can help lower high blood pressure. You may need to change some of your habits. Lifestyle changes may include:  Following the DASH diet. This diet is high in fruits, vegetables, and whole grains. It is low in salt, red meat, and added sugars.  Getting at least 2 hours of brisk physical activity every week.  Losing weight if necessary.  Not smoking.  Limiting alcoholic beverages.  Learning ways to reduce stress. If lifestyle changes are not enough to get your blood pressure under control, your health care provider may prescribe medicine. You may need to take more than one. Work closely with your health care provider to understand the risks and benefits. HOME CARE INSTRUCTIONS  Have your blood pressure rechecked as directed by your health care provider.   Take medicines only as directed by your health care provider. Follow the directions  carefully. Blood pressure medicines must be taken as prescribed. The medicine does not work as well when you skip doses. Skipping doses also puts you at risk for problems.   Do not smoke.   Monitor your blood pressure at home as directed by your health care provider. SEEK MEDICAL CARE IF:   You think you are having a reaction to medicines taken.  You have recurrent headaches or feel dizzy.  You have swelling in your ankles.  You have trouble with your vision. SEEK IMMEDIATE  MEDICAL CARE IF:  You develop a severe headache or confusion.  You have unusual weakness, numbness, or feel faint.  You have severe chest or abdominal pain.  You vomit repeatedly.  You have trouble breathing. MAKE SURE YOU:   Understand these instructions.  Will watch your condition.  Will get help right away if you are not doing well or get worse. Document Released: 02/11/2005 Document Revised: 06/28/2013 Document Reviewed: 12/04/2012 [facility] Patient Information 2015 [place], [place]. This information is not intended to replace advice given to you by your health care provider. Make sure you discuss any questions you have with your health care provider.

## 2014-01-06 NOTE — Progress Notes (Signed)
Patient Name: [name] Date of Encounter: 01/06/2014     Principal Problem:   Chest pain, midsternal Active Problems:   Mixed hyperlipidemia   Essential hypertension   Hyponatremia   Chest pain, mid sternal    SUBJECTIVE  Denies any CP or SOB. Described CP as central dull pressure radiate to her back. Similar symptom prior to previous PCI. Denies any h/o aortic dissection.  CURRENT MEDS . acidophilus  1 capsule Oral Daily  . aspirin EC  81 mg Oral Daily  . enoxaparin (LOVENOX) injection  40 mg Subcutaneous Q24H  . fluticasone  1 spray Each Nare Daily  . hydrALAZINE  50 mg Oral 3 times per day  . isosorbide mononitrate  60 mg Oral Daily  . latanoprost  1 drop Both Eyes QHS  . letrozole  2.5 mg Oral Daily  . loratadine  10 mg Oral Daily  . losartan  50 mg Oral BID  . metoprolol  50 mg Oral BID  .  morphine injection  4 mg Intravenous Once  . pantoprazole  40 mg Oral Daily  . tiZANidine  2 mg Oral QHS  . vitamin B-12  1,000 mcg Oral Daily    OBJECTIVE  Filed Vitals:   01/05/14 2045 01/05/14 2105 01/06/14 0023 01/06/14 0529  BP: 125/58  84/52 103/56  Pulse: 65  64 55  Temp: 98.1 F (36.7 C)  98.1 F (36.7 C) 97.7 F (36.5 C)  TempSrc: Oral  Oral Oral  Resp:      Height: 4' 10.5" (1.486 m)     Weight: 134 lb 4.8 oz (60.918 kg) 128 lb 3.2 oz (58.151 kg)  127 lb 11.2 oz (57.924 kg)  SpO2: 94%  95% 94%    Intake/Output Summary (Last 24 hours) at 01/06/14 0743 Last data filed at 01/06/14 0644  Gross per 24 hour  Intake    480 ml  Output    700 ml  Net   -220 ml   Filed Weights   01/05/14 2045 01/05/14 2105 01/06/14 0529  Weight: 134 lb 4.8 oz (60.918 kg) 128 lb 3.2 oz (58.151 kg) 127 lb 11.2 oz (57.924 kg)    PHYSICAL EXAM  General: Pleasant, NAD. Neuro: Alert and oriented X 3. Moves all extremities spontaneously. Psych: Normal affect. HEENT:  Normal  Neck: Supple without bruits or JVD. Lungs:  Resp regular and unlabored, CTA. Heart: RRR  no s3, s4, or murmurs. Abdomen: Soft, non-tender, non-distended, BS + x 4.  Extremities: No clubbing, cyanosis or edema. DP/PT/Radials 2+ and equal bilaterally.  Accessory Clinical Findings  CBC  Recent Labs  01/05/14 1348  WBC 10.9*  HGB 12.3  HCT 36.2  MCV 89.2  PLT 333   Basic Metabolic Panel  Recent Labs  01/05/14 1348  NA 130*  K 5.2  CL 91*  CO2 22  GLUCOSE 100*  BUN 13  CREATININE 0.59  CALCIUM 9.3   Cardiac Enzymes  Recent Labs  01/05/14 1602 01/05/14 2303 01/06/14 0330  TROPONINI <0.30 <0.30 <0.30    TELE NSR with HR 50-60s, no significant ventricular ectopy    ECG  NSR with HR 60s, no significant ST-T wave changes  Echocardiogram  pending    Radiology/Studies  Dg Chest 2 View  01/05/2014   CLINICAL DATA:  Chest pain, back pain  EXAM: CHEST  2 VIEW  COMPARISON:  None.  FINDINGS: There is no focal parenchymal opacity, pleural effusion, or pneumothorax. The heart and mediastinal contours are unremarkable. There is  evidence of prior CABG.  There is mild thoracic spine spondylosis.  IMPRESSION: No active cardiopulmonary disease.   Electronically Signed   By: Kathreen Devoid   On: 01/05/2014 14:22   Dg Chest 2 View  12/23/2013   CLINICAL DATA:  Cough and dizziness  EXAM: CHEST  2 VIEW  COMPARISON:  December 21, 2013  FINDINGS: There is underlying emphysematous change. There is no edema or consolidation. Heart is upper normal in size with pulmonary vascularity within normal limits. No adenopathy. There is degenerative change in the thoracic spine. Patient is status post coronary artery bypass grafting.  IMPRESSION: Underlying emphysematous change.  No edema or consolidation.   Electronically Signed   By: Lowella Grip M.D.   On: 12/23/2013 18:41   Dg Chest 2 View  12/22/2013   CLINICAL DATA:  Cough and chest congestion.  EXAM: CHEST  2 VIEW  COMPARISON:  06/17/2012  FINDINGS: Heart size and pulmonary vascularity are normal and the lungs are clear  but are somewhat hyperinflated with flattening of the diaphragm consistent with emphysema. No effusions.  CABG.  No acute osseous abnormality.  IMPRESSION: No acute abnormalities.  Hyperinflated lungs consistent with COPD.   Electronically Signed   By: Rozetta Nunnery M.D.   On: 12/22/2013 08:13   Ct Head Wo Contrast  01/03/2014   CLINICAL DATA:  Headache since midnight 01/03/2014.  EXAM: CT HEAD WITHOUT CONTRAST  TECHNIQUE: Contiguous axial images were obtained from the base of the skull through the vertex without intravenous contrast.  COMPARISON:  Head CT scan 12/23/2013 and 11/02/2012.  FINDINGS: Cortical atrophy and chronic microvascular ischemic change are identified. There is no evidence of acute intracranial abnormality including infarct, hemorrhage, mass lesion, mass effect, midline shift or abnormal extra-axial fluid collection. No hydrocephalus or pneumocephalus. The calvarium is intact.  IMPRESSION: No acute finding.  Atrophy and chronic microvascular ischemic change.   Electronically Signed   By: Inge Rise M.D.   On: 01/03/2014 05:29   Ct Head Wo Contrast  12/23/2013   CLINICAL DATA:  Cough, chest pain, headache and dizziness  EXAM: CT HEAD WITHOUT CONTRAST  TECHNIQUE: Contiguous axial images were obtained from the base of the skull through the vertex without intravenous contrast.  COMPARISON:  CT 11/02/2012  FINDINGS: No acute intracranial hemorrhage. No focal mass lesion. No CT evidence of acute infarction. No midline shift or mass effect. No hydrocephalus. Basilar cisterns are patent.  There is periventricular and white matter hypodensities not changed from prior. Generalized cortical atrophy unchanged.  Paranasal sinuses and  mastoid air cells are clear.  IMPRESSION: 1. No acute intracranial findings. 2. Atrophy and white matter microvascular disease are not changed from prior.   Electronically Signed   By: Suzy Bouchard M.D.   On: 12/23/2013 18:34   US Renal  01/05/2014   CLINICAL  DATA:  Labile hypertension.  EXAM: RENAL/URINARY TRACT ULTRASOUND COMPLETE  COMPARISON:  CT 11/07/2011  FINDINGS: Right Kidney:  Length: 10.2 cm. Echogenicity within normal limits. No mass or hydronephrosis visualized.  Left Kidney:  Length: 10.4 cm. Echogenicity within normal limits. No mass or hydronephrosis visualized.  Bladder:  Appears normal for degree of bladder distention.  IMPRESSION: Normal renal ultrasound.   Electronically Signed   By: Marin Olp M.D.   On: 01/05/2014 18:43   Dg Abd Portable 1v  12/24/2013   CLINICAL DATA:  78 year old female with acute vomiting. Initial encounter.  EXAM: PORTABLE ABDOMEN - 1 VIEW  COMPARISON:  CT Abdomen and Pelvis  11/07/2011.  FINDINGS: Portable AP supine view at 1333 hrs. Non obstructed bowel gas pattern. There may be some dilute contrast in the small bowel, uncertain. Abdominal and pelvic visceral contours are stable. Stable lung bases. No definite pneumoperitoneum on this supine view. No acute osseous abnormality identified.  IMPRESSION: Negative.   Electronically Signed   By: Lars Pinks M.D.   On: 12/24/2013 14:04    ASSESSMENT AND PLAN  1. Atypical chest pain  - likely related to labile HTN, prolonged episode of CP with no EKG or enzyme changes  - CP resolved last night, however concerned this morning as patient is unclear what caused it, described it as dull chest pressure radiating to the back, radial pulse equal bilaterally, denies h/o aortic dissection, felt the symptom was reminiscent the sx prior to previous cath, pending stress test. Pending stress test today, if negative potentially can be discharged.   2. Labile HTN  - hydralazine increased to 50mg  TID, Imdur up to 60mg   - off HCTZ during recent admission due to Na 119  - workup for secondary HTN during this admission, renal U/S normal kidney size, however did not mention renal artery. Aldosterone lab pending. Pending echo  - Consider add US Renal Artery Stenosis for further  evaluation  - BP dropped down to 80s this morning, after 50mg  hydralazine. If it drops again with 2nd dose today, may need to return hydralazine back down to 25mg  TID and continue with increased dose of 60mg  Imdur.   3. CAD s/p CABG 2011 4. HLD 5. Fibromyalgia 6. IBS 7. GERD 8. HA: recently admitted 10/29 - 11/2 with HA due to hypertensive emergency and hyponatremia   Signed, Almyra Deforest PA-C Pager: [phone]  Patient seen in nuclear.  Pain between shoulder blades improved and sporadic Does not sound cardiac Mediastinum normal on CXR  Headache better BP improved suspect hydralazine 25 tid will be better dose Await results of myovue  Spoke with huspand  Jenkins Rouge

## 2014-01-06 NOTE — Progress Notes (Signed)
UR completed 

## 2014-01-06 NOTE — Discharge Summary (Signed)
Discharge Summary   Patient ID: [name] MRN: [id number], DOB/AGE: 04-22-1935 78 y.o. Admit date: 01/05/2014 D/C date:     01/06/2014  Primary Cardiologist: Dr. Johnsie Cancel  Principal Problem:   Chest pain, midsternal Active Problems:   Mixed hyperlipidemia   Coronary atherosclerosis   Asthma   GERD   Hyponatremia   Labile hypertension   Fibromyalgia   Admission Dates: 01/05/14-01/06/14 Discharge Diagnosis: chest pain s/p normal nuclear stress test  HPI: [name] is a 78 y.o. female with a history of CAD, CABG 2011, HTN, HLD, HA, fibromyalgia, IBS, and GERD who presented to [facility] on 01/05/14 with chest pain.   Last ischemic eval was a Lexiscan stress test in 2013 (done for chest pain). She was admitted 10/29-11/02 with HA due to hypertensive emergency (SBP > 220), and hyponatremia (Na + 119). HCTZ was stopped and her sodium improved. She may have been taking an OTC medication with pseudoephedrine.   She was seen in the ER on 11/04 with HTN, SBP > 210. Discharged from the ER.  een by Dr. Lenna Gilford 11/05, BP meds increased with addition of hydralazine 25 mg BID, increased Losartan 50 mg-->100 mg daily.  ED visit 11/09 with HA, BP 212/92, MD suggested she stagger meds for better 24 hour coverage, no med changes made.   She was seen in the office by Richardson Dopp on 11/10 and her BP was 164/80. Her hydralazine was increased from 25 mg bid to 25 mg tid. No complaints of chest pain or back pain.  On the night prior to admission she had chest pain that would wax and wane but did not resolve. There was no associated N&V, SOB or diaphoresis. The following day her chest pain worsened, up to a 9/10. She took Tylenol, 2 SL NTG with no relief, so came to the ER because the pain reminded her of her pre-PCI symptoms.    Hospital Course  Atypical chest pain/ CAD s/p CABG 2011  - tropnin neg x3 - likely related to labile HTN, prolonged episode of CP with no EKG or  enzyme changes - s/p neg Lexiscan nuclear stress test.  IMPRESSION: 1. Low risk scan with abnormal ECG portion of the test but no resting or stress induced perfusion defects. 2. Hyperdynamic LVEF >70%, no regional wall motion abnormalities  - Of note, during her lexiscan NST she developed flushing and EKG showed ST depression in II, avF, V3-V6 up to 1 mm. No arrhythmias. Symptoms did not improve by 8 minutes so aminophylline 80mg  given with gradual relief of chest discomfort and improvement in EKG. Dr. Meda Coffee commented that this could be a sign of microvascular disease in the setting of normal nuclear perfusion images.  -- 2d ECHO on this admission with EF 60 -65%, no RWMA or valvular disease.  Labile HTN - hydralazine increased to 50mg  TID, Imdur increased up to 60mg  with better control of her BP. - off HCTZ during recent admission due to hyponatremia (Na 119) - workup for secondary HTN during this admission, renal U/S normal kidney size, however did not mention renal artery. Aldosterone lab pending.   HLD- cont statin  HA: recently admitted 10/29 - 11/2 with HA due to hypertensive emergency and hyponatremia  - stable this admission   Fibromyalgia  IBS  GERD  The patient has had an uncomplicated hospital course and is recovering well. She has been seen by Dr. Johnsie Cancel today and deemed ready for discharge home. All follow-up appointments have been scheduled.  Discharge medications are listed below.   Discharge Vitals: Blood pressure 100/47, pulse 81, temperature 98.2 F (36.8 C), temperature source Oral, resp. rate 17, height 4' 10.5" (1.486 m), weight 127 lb 11.2 oz (57.924 kg), last menstrual period 11/26/1990, SpO2 95 %.  Labs: Lab Results  Component Value Date   WBC 10.9* 01/05/2014   HGB 12.3 01/05/2014   HCT 36.2 01/05/2014   MCV 89.2 01/05/2014   PLT 271 01/05/2014     Recent Labs Lab 01/05/14 1348  NA 130*  K  5.2  CL 91*  CO2 22  BUN 13  CREATININE 0.59  CALCIUM 9.3  GLUCOSE 100*    Recent Labs  01/05/14 1602 01/05/14 2303 01/06/14 0330 01/06/14 0935  TROPONINI <0.30 <0.30 <0.30 <0.30      Diagnostic Studies/Procedures   Dg Chest 2 View  01/05/2014   CLINICAL DATA:  Chest pain, back pain  EXAM: CHEST  2 VIEW  COMPARISON:  None.  FINDINGS: There is no focal parenchymal opacity, pleural effusion, or pneumothorax. The heart and mediastinal contours are unremarkable. There is evidence of prior CABG.  There is mild thoracic spine spondylosis.  IMPRESSION: No active cardiopulmonary disease.   Electronically Signed   By: Kathreen Devoid   On: 01/05/2014 14:22   Dg Chest 2 View  12/23/2013   CLINICAL DATA:  Cough and dizziness  EXAM: CHEST  2 VIEW  COMPARISON:  December 21, 2013  FINDINGS: There is underlying emphysematous change. There is no edema or consolidation. Heart is upper normal in size with pulmonary vascularity within normal limits. No adenopathy. There is degenerative change in the thoracic spine. Patient is status post coronary artery bypass grafting.  IMPRESSION: Underlying emphysematous change.  No edema or consolidation.   Electronically Signed   By: Lowella Grip M.D.   On: 12/23/2013 18:41   Dg Chest 2 View  12/22/2013   CLINICAL DATA:  Cough and chest congestion.  EXAM: CHEST  2 VIEW  COMPARISON:  06/17/2012  FINDINGS: Heart size and pulmonary vascularity are normal and the lungs are clear but are somewhat hyperinflated with flattening of the diaphragm consistent with emphysema. No effusions.  CABG.  No acute osseous abnormality.  IMPRESSION: No acute abnormalities.  Hyperinflated lungs consistent with COPD.   Electronically Signed   By: Rozetta Nunnery M.D.   On: 12/22/2013 08:13   Ct Head Wo Contrast  01/03/2014   CLINICAL DATA:  Headache since midnight 01/03/2014.  EXAM: CT HEAD WITHOUT CONTRAST  TECHNIQUE: Contiguous axial images were obtained from the base of the skull  through the vertex without intravenous contrast.  COMPARISON:  Head CT scan 12/23/2013 and 11/02/2012.  FINDINGS: Cortical atrophy and chronic microvascular ischemic change are identified. There is no evidence of acute intracranial abnormality including infarct, hemorrhage, mass lesion, mass effect, midline shift or abnormal extra-axial fluid collection. No hydrocephalus or pneumocephalus. The calvarium is intact.  IMPRESSION: No acute finding.  Atrophy and chronic microvascular ischemic change.   Electronically Signed   By: Inge Rise M.D.   On: 01/03/2014 05:29   Ct Head Wo Contrast  12/23/2013   CLINICAL DATA:  Cough, chest pain, headache and dizziness  EXAM: CT HEAD WITHOUT CONTRAST  TECHNIQUE: Contiguous axial images were obtained from the base of the skull through the vertex without intravenous contrast.  COMPARISON:  CT 11/02/2012  FINDINGS: No acute intracranial hemorrhage. No focal mass lesion. No CT evidence of acute infarction. No midline shift or mass effect. No hydrocephalus. Basilar cisterns  are patent.  There is periventricular and white matter hypodensities not changed from prior. Generalized cortical atrophy unchanged.  Paranasal sinuses and  mastoid air cells are clear.  IMPRESSION: 1. No acute intracranial findings. 2. Atrophy and white matter microvascular disease are not changed from prior.   Electronically Signed   By: Suzy Bouchard M.D.   On: 12/23/2013 18:34   US Renal  01/05/2014   CLINICAL DATA:  Labile hypertension.  EXAM: RENAL/URINARY TRACT ULTRASOUND COMPLETE  COMPARISON:  CT 11/07/2011  FINDINGS: Right Kidney:  Length: 10.2 cm. Echogenicity within normal limits. No mass or hydronephrosis visualized.  Left Kidney:  Length: 10.4 cm. Echogenicity within normal limits. No mass or hydronephrosis visualized.  Bladder:  Appears normal for degree of bladder distention.  IMPRESSION: Normal renal ultrasound.   Electronically Signed   By: Marin Olp M.D.   On: 01/05/2014 18:43    Nm Myocar Multi W/spect W/wall Motion / Ef  01/06/2014   CLINICAL DATA:  Chest pain  EXAM: Lexiscan Myovue  TECHNIQUE: The patient received IV Lexiscan .4mg  over 15 seconds. 33.0 mCi of Technetium 33m Sestamibi injected at 30 seconds. Quantitative SPECT images were obtained in the vertical, horizontal and short axis planes after a 45 minute delay. Rest images were obtained with similar planes and delay using 10.2 mCi of Technetium 14m Sestamibi.  FINDINGS: ECG: Rest: NSR, Stress: 1 mm down sloaping inferolateral ST depressions that persist 11 minutes into recovery.  Symptoms: Chest pain after Lexiscan injection, resolved after aminophylline injection.  RAW Data:  TID:  0.73.  Normal lung to heart ratio.  Quantitiative Gated SPECT EF: Hyperdynamic LVEF >70%, no regional wall motion abnormalities.  Perfusion Images:  No resting or stress induced perfusion defect.  IMPRESSION: 1. Low risk scan with abnormal ECG portion of the test but no resting or stress induced perfusion defects.  2. Hyperdynamic LVEF >70%, no regional wall motion abnormalities.  Ena Dawley   Electronically Signed   By: Ena Dawley   On: 01/06/2014 16:05   Dg Abd Portable 1v  12/24/2013   CLINICAL DATA:  79 year old female with acute vomiting. Initial encounter.  EXAM: PORTABLE ABDOMEN - 1 VIEW  COMPARISON:  CT Abdomen and Pelvis 11/07/2011.  FINDINGS: Portable AP supine view at 1333 hrs. Non obstructed bowel gas pattern. There may be some dilute contrast in the small bowel, uncertain. Abdominal and pelvic visceral contours are stable. Stable lung bases. No definite pneumoperitoneum on this supine view. No acute osseous abnormality identified.  IMPRESSION: Negative.   Electronically Signed   By: Lars Pinks M.D.   On: 12/24/2013 14:04    2D ECHO: 01/06/2014 LV EF: 60% -  65% History:  PMH:  Coronary artery disease. Risk factors: Hypertension. Dyslipidemia. Study Conclusions - Left ventricle: The cavity size was normal.  Wall thickness was normal. Systolic function was normal. The estimated ejection fraction was in the range of 60% to 65%. Wall motion was normal; there were no regional wall motion abnormalities. Left ventricular diastolic function parameters were normal. - Aortic valve: There was trivial regurgitation. - Atrial septum: No defect or patent foramen ovale was identified.   Discharge Medications     Medication List    STOP taking these medications        ibuprofen 200 MG tablet  Commonly known as:  ADVIL,MOTRIN      TAKE these medications        ALLEGRA 180 MG tablet  Generic drug:  fexofenadine  Take 180 mg by  mouth daily.     aspirin EC 81 MG tablet  Take 81 mg by mouth daily.     benzonatate 200 MG capsule  Commonly known as:  TESSALON  Take 1 capsule (200 mg total) by mouth 3 (three) times daily as needed for cough.     bifidobacterium infantis capsule  Take 1 capsule by mouth daily.     Fish Oil 1200 MG Caps  Take 1,200 mg by mouth daily.     fluocinonide 0.05 % external solution  Commonly known as:  LIDEX  Apply 1 application topically daily as needed (scalp itching).     GLUCOSAMINE CHONDR 1500 COMPLX PO  Take 1 tablet by mouth 3 (three) times daily.     hydrALAZINE 50 MG tablet  Commonly known as:  APRESOLINE  Take 1 tablet (50 mg total) by mouth 3 (three) times daily.     hyoscyamine 0.125 MG Tbdp disintergrating tablet  Commonly known as:  ANASPAZ  Place 0.125 mg under the tongue every 6 (six) hours as needed for bladder spasms or cramping.     isosorbide mononitrate 60 MG 24 hr tablet  Commonly known as:  IMDUR  Take 1 tablet (60 mg total) by mouth daily.     ketoconazole 2 % shampoo  Commonly known as:  NIZORAL  Apply 1 application topically 2 (two) times a week. As needed for itching a couple of times week     latanoprost 0.005 % ophthalmic solution  Commonly known as:  XALATAN  Place 1 drop into both eyes at bedtime.     letrozole  2.5 MG tablet  Commonly known as:  FEMARA  Take 1 tablet (2.5 mg total) by mouth daily.     losartan 100 MG tablet  Commonly known as:  COZAAR  Take 1/2 tablet by mouth two times daily     metoprolol 50 MG tablet  Commonly known as:  LOPRESSOR  Take 1 tablet (50 mg total) by mouth 2 (two) times daily.     NASONEX 50 MCG/ACT nasal spray  Generic drug:  mometasone  Place 2 sprays into the nose daily as needed (congestion).     nitroGLYCERIN 0.4 MG SL tablet  Commonly known as:  NITROSTAT  Place 0.4 mg under the tongue every 5 (five) minutes as needed for chest pain.     omeprazole 40 MG capsule  Commonly known as:  PRILOSEC  Take 1 capsule (40 mg total) by mouth daily. 30 minutes before 1st meal of the day     tiZANidine 2 MG tablet  Commonly known as:  ZANAFLEX  Take 2 mg by mouth at bedtime.     traMADol 50 MG tablet  Commonly known as:  ULTRAM  Take 1 tablet (50 mg total) by mouth 3 (three) times daily as needed.     vitamin B-12 1000 MCG tablet  Commonly known as:  CYANOCOBALAMIN  Take 1,000 mcg by mouth daily.        Disposition   The patient will be discharged in stable condition to home.  Follow-up Information    Follow up with Jenkins Rouge, MD On 03/23/2014.   Specialty:  Cardiology   Why:  @ 9:15am   Contact information:   [address] [address] [phone]       Follow up with Melina Copa, PA-C On 01/14/2014.   Specialty:  Cardiology   Why:  @ 8am   Contact information:   [address]  [address] [place] [address] [phone]         Duration of Discharge Encounter: Greater than 30 minutes including physician and PA time.  Mable Fill R PA-C 01/06/2014, 5:09 PM

## 2014-01-06 NOTE — Progress Notes (Signed)
Echo Lab  2D Echocardiogram completed.  [place], RDCS 01/06/2014 8:45 AM

## 2014-01-07 ENCOUNTER — Telehealth: Payer: Self-pay | Admitting: Cardiovascular Disease

## 2014-01-07 NOTE — Telephone Encounter (Signed)
Make hydralazine 25 bid

## 2014-01-07 NOTE — Telephone Encounter (Signed)
SPOKE WITH  PT  HAD ONLY  TAKEN   ISOSORBIDE  60 MG   AND  B/P  WAS 99/48  DID NOT  TAKE  LOPRESSOR ,HYDRALAZINE,NOR LOSARTAN THIS  AM PT  WANTING TO KNOW IF  SHOULD  TAKE WHAT  WOULD   BE    SECOND   HYDRALAZINE  INSTRUCTED   TO  TRY AND  TAKE   AFTERNOON DOSE  OF  HYDRALAZINE  BUT ONLY TAKE  25 MG INSTEAD OF   50 MG  AS B/P HAS  INCREASED WILL FORWARD MESSAGE TO  DR Johnsie Cancel FOR  RECOMMENDATIONS./CY

## 2014-01-07 NOTE — Telephone Encounter (Signed)
Pt  Aware .Adonis Housekeeper

## 2014-01-07 NOTE — Telephone Encounter (Signed)
New message      Talk to the nurse about her bp.  This am 99/48 after taking her isosorbide.  Is this too low?

## 2014-01-10 ENCOUNTER — Encounter: Payer: Self-pay | Admitting: Cardiovascular Disease

## 2014-01-10 ENCOUNTER — Telehealth: Payer: Self-pay | Admitting: Cardiology

## 2014-01-10 ENCOUNTER — Ambulatory Visit (INDEPENDENT_AMBULATORY_CARE_PROVIDER_SITE_OTHER): Payer: Medicare Other | Admitting: Cardiovascular Disease

## 2014-01-10 VITALS — BP 130/64 | HR 65 | Ht 58.5 in | Wt 133.0 lb

## 2014-01-10 DIAGNOSIS — R519 Headache, unspecified: Secondary | ICD-10-CM

## 2014-01-10 DIAGNOSIS — I2510 Atherosclerotic heart disease of native coronary artery without angina pectoris: Secondary | ICD-10-CM | POA: Diagnosis not present

## 2014-01-10 DIAGNOSIS — I10 Essential (primary) hypertension: Secondary | ICD-10-CM

## 2014-01-10 DIAGNOSIS — E782 Mixed hyperlipidemia: Secondary | ICD-10-CM

## 2014-01-10 DIAGNOSIS — I161 Hypertensive emergency: Secondary | ICD-10-CM

## 2014-01-10 DIAGNOSIS — R51 Headache: Secondary | ICD-10-CM

## 2014-01-10 NOTE — Assessment & Plan Note (Signed)
Stable with no angina and good activity level.  Continue medical Rx Recent myovue non ischemic

## 2014-01-10 NOTE — Assessment & Plan Note (Signed)
BP better  Space meds out as indicated on visit  Stop imdur as may ppt vascular headaches

## 2014-01-10 NOTE — Assessment & Plan Note (Signed)
Etiology not clear f/u Lenna Gilford and yan  CT head ok Continue Tramadol

## 2014-01-10 NOTE — Patient Instructions (Signed)
Your physician wants you to follow-up in:  6 MONTHS WITH DR NISHAN  You will receive a reminder letter in the mail two months in advance. If you don't receive a letter, please call our office to schedule the follow-up appointment. Your physician recommends that you continue on your current medications as directed. Please refer to the Current Medication list given to you today. 

## 2014-01-10 NOTE — Assessment & Plan Note (Signed)
Cholesterol is at goal.  Continue current dose of statin and diet Rx.  No myalgias or side effects.  F/U  LFT's in 6 months. Lab Results  Component Value Date   LDLCALC 110* 04/06/2012             

## 2014-01-10 NOTE — Progress Notes (Signed)
Patient ID: [name], female   DOB: 03-06-1935, 78 y.o.   MRN: [id number] [name] is a 78 y.o. female with a history of CAD, CABG 2011, HTN, HLD, HA, fibromyalgia, IBS, and GERD who presented to [facility] on 01/05/14 with chest pain.   Last ischemic eval was a Lexiscan stress test in 2013 (done for chest pain). She was admitted 10/29-11/02 with HA due to hypertensive emergency (SBP > 220), and hyponatremia (Na + 119). HCTZ was stopped and her sodium improved. She [name] have been taking an OTC medication with pseudoephedrine.   She was seen in the ER on 11/04 with HTN, SBP > 210. Discharged from the ER.  een by Dr. Lenna [name] 11/05, BP meds increased with addition of hydralazine 25 mg BID, increased Losartan 50 mg-->100 mg daily.  ED visit 11/09 with HA, BP 212/92, MD suggested she stagger meds for better 24 hour coverage, no med changes made.   She was seen in hospital 11/15  With r/o normal lexiscan myovue and echo with normal EF  BP meds adjusted  Not on diuretic due to hyponatremia She seems more anxious with labile emotions  Sees [name] as primary and [name] neuro for headaches.  Has had them ? A year  When she gets them Makes her BP labile     ROS: Denies fever, malais, weight loss, blurry vision, decreased visual acuity, cough, sputum, SOB, hemoptysis, pleuritic pain, palpitaitons, heartburn, abdominal pain, melena, lower extremity edema, claudication, or rash.  All other systems reviewed and negative  General: Affect appropriate Healthy:  appears stated age 35: normal Neck supple with no adenopathy JVP normal no bruits no thyromegaly Lungs clear with no wheezing and good diaphragmatic motion Heart:  S1/S2 no murmur, no rub, gallop or click PMI normal Abdomen: benighn, BS positve, no tenderness, no AAA no bruit.  No HSM or HJR Distal pulses intact with no bruits No edema Neuro non-focal Skin warm and dry No muscular weakness   Current Outpatient  Prescriptions  Medication Sig Dispense Refill  . aspirin EC 81 MG tablet Take 81 mg by mouth daily.    . fexofenadine (ALLEGRA) 180 MG tablet Take 180 mg by mouth daily.      . fluocinonide (LIDEX) 0.05 % external solution Apply 1 application topically daily as needed (scalp itching).     . Glucosamine-Chondroit-Vit C-Mn (GLUCOSAMINE CHONDR 1500 COMPLX PO) Take 1 tablet by mouth 3 (three) times daily.      . hydrALAZINE (APRESOLINE) 50 MG tablet Take 1 tablet (50 mg total) by mouth 3 (three) times daily. (Patient taking differently: Take 25 mg by mouth 2 (two) times daily. ) 45 tablet 6  . hyoscyamine (ANASPAZ) 0.125 MG TBDP disintergrating tablet Place 0.125 mg under the tongue every 6 (six) hours as needed for bladder spasms or cramping.    . isosorbide mononitrate (IMDUR) 60 MG 24 hr tablet Take 1 tablet (60 mg total) by mouth daily. 30 tablet 6  . ketoconazole (NIZORAL) 2 % shampoo Apply 1 application topically 2 (two) times a week. As needed for itching a couple of times week    . latanoprost (XALATAN) 0.005 % ophthalmic solution Place 1 drop into both eyes at bedtime.     [name] letrozole (FEMARA) 2.5 MG tablet Take 1 tablet (2.5 mg total) by mouth daily. 90 tablet 3  . losartan (COZAAR) 100 MG tablet Take 1/2 tablet by mouth two times daily 30 tablet 6  . metoprolol (LOPRESSOR) 50 MG tablet Take  1 tablet (50 mg total) by mouth 2 (two) times daily. 60 tablet 1  . mometasone (NASONEX) 50 MCG/ACT nasal spray Place 2 sprays into the nose daily as needed (congestion).     . nitroGLYCERIN (NITROSTAT) 0.4 MG SL tablet Place 0.4 mg under the tongue every 5 (five) minutes as needed for chest pain.    . Omega-3 Fatty Acids (FISH OIL) 1200 MG CAPS Take 1,200 mg by mouth daily.     [name] omeprazole (PRILOSEC) 40 MG capsule Take 1 capsule (40 mg total) by mouth daily. 30 minutes before 1st meal of the day 30 capsule 6  . tiZANidine (ZANAFLEX) 2 MG tablet Take 2 mg by mouth at bedtime.    . traMADol (ULTRAM) 50  MG tablet Take 1 tablet (50 mg total) by mouth 3 (three) times daily as needed. 90 tablet 5  . vitamin B-12 (CYANOCOBALAMIN) 1000 MCG tablet Take 1,000 mcg by mouth daily.       No current facility-administered medications for this visit.    Allergies  Choline fenofibrate; Simvastatin; Hydrocodone; Levaquin; Adhesive; Ceclor; Clarithromycin; Codeine; Doxycycline; Lisinopril; Penicillins; and Tobramycin-dexamethasone  Electrocardiogram:  SR rate 64  Normal LAE   Assessment and Plan

## 2014-01-10 NOTE — Telephone Encounter (Signed)
Called to report chronic HA and SBP to 235. She already took all of her BP meds for today.  She otherwise feels okay and since the HA are chronic I instructed her to take 25mg  of hydralazine and 1SL NTG now. In 10 minutes she can repeat SL NTG if BP still elevated. In 1 hour, if SBP is still above 200, she will take a 2nd 25mg  dose of hydralazine. If symptoms worsen or BP does not get under control she will call.

## 2014-01-11 ENCOUNTER — Telehealth: Payer: Self-pay | Admitting: Pulmonary Disease

## 2014-01-11 NOTE — Telephone Encounter (Signed)
lmtcb for pt.  

## 2014-01-11 NOTE — Telephone Encounter (Signed)
Per SN--  Will need to make sure her medications are as follows:   Hydralazine taking 1 po TID Cozaar 100 mg   1/2 TID metroprolol 50 mg  1 po TID  Called and spoke with pt and she is aware of how to take these medications and she will call back for any concerns.  Pt is aware to change to these meds and she will keep up with her BP readings at home.

## 2014-01-11 NOTE — Telephone Encounter (Signed)
Called and spoke to pt. Pt stated her BP has been elevated recently. Pt states her BP was 200/98 last night at 9-10pm, pt called the on call physician with her cardiologist and was advised to take 25mg  of hydralizine and 0.4mg  nitro and if the systolic was still above 878 in one hour then to take another 25mg  of hydralazine. Pt stated the pressure when down after an hour. Pt stated when she woke up this morning she had a headache and her pressure was 190/80, pt took her hydralazine, metoprolol and losartan and her BP was 118/58 at 11am. Pt requesting recs to help keep her BP lower at night. Pt denies any other complaints. Pt alert and oriented x 4. Pt last seen by SN on 12/30/2013.  SN please advise.   Allergies  Allergen Reactions  . Choline Fenofibrate Other (See Comments)     pt states INTOL to Trilipix w/ "thigh burning"  . Simvastatin Other (See Comments)     pt states INTOL to STATINS \\T \ refuses to restart  . Hydrocodone     Nightmare after taking cough syrup w/hydrocodone  . Levaquin [Levofloxacin In D5w]     Elevated BP  . Adhesive [Tape] Rash  . Ceclor [Cefaclor] Rash  . Clarithromycin Rash  . Codeine Nausea Only  . Doxycycline Rash  . Lisinopril Cough    Developed ACE cough...  . Penicillins Itching and Rash    At injection site  . Tobramycin-Dexamethasone Rash    Current Outpatient Prescriptions on File Prior to Visit  Medication Sig Dispense Refill  . aspirin EC 81 MG tablet Take 81 mg by mouth daily.    . fexofenadine (ALLEGRA) 180 MG tablet Take 180 mg by mouth daily.      . fluocinonide (LIDEX) 0.05 % external solution Apply 1 application topically daily as needed (scalp itching).     . Glucosamine-Chondroit-Vit C-Mn (GLUCOSAMINE CHONDR 1500 COMPLX PO) Take 1 tablet by mouth 3 (three) times daily.      . hydrALAZINE (APRESOLINE) 50 MG tablet Take 1 tablet (50 mg total) by mouth 3 (three) times daily. (Patient taking differently: Take 25 mg by mouth 2 (two) times daily.  ) 45 tablet 6  . hyoscyamine (ANASPAZ) 0.125 MG TBDP disintergrating tablet Place 0.125 mg under the tongue every 6 (six) hours as needed for bladder spasms or cramping.    [name] ketoconazole (NIZORAL) 2 % shampoo Apply 1 application topically 2 (two) times a week. As needed for itching a couple of times week    . latanoprost (XALATAN) 0.005 % ophthalmic solution Place 1 drop into both eyes at bedtime.     [name] letrozole (FEMARA) 2.5 MG tablet Take 1 tablet (2.5 mg total) by mouth daily. 90 tablet 3  . losartan (COZAAR) 100 MG tablet Take 1/2 tablet by mouth two times daily 30 tablet 6  . metoprolol (LOPRESSOR) 50 MG tablet Take 1 tablet (50 mg total) by mouth 2 (two) times daily. 60 tablet 1  . mometasone (NASONEX) 50 MCG/ACT nasal spray Place 2 sprays into the nose daily as needed (congestion).     . nitroGLYCERIN (NITROSTAT) 0.4 MG SL tablet Place 0.4 mg under the tongue every 5 (five) minutes as needed for chest pain.    . Omega-3 Fatty Acids (FISH OIL) 1200 MG CAPS Take 1,200 mg by mouth daily.     [name] omeprazole (PRILOSEC) 40 MG capsule Take 1 capsule (40 mg total) by mouth daily. 30 minutes before 1st meal of the day  30 capsule 6  . tiZANidine (ZANAFLEX) 2 MG tablet Take 2 mg by mouth at bedtime.    . traMADol (ULTRAM) 50 MG tablet Take 1 tablet (50 mg total) by mouth 3 (three) times daily as needed. 90 tablet 5  . vitamin B-12 (CYANOCOBALAMIN) 1000 MCG tablet Take 1,000 mcg by mouth daily.       No current facility-administered medications on file prior to visit.

## 2014-01-12 ENCOUNTER — Telehealth: Payer: Self-pay | Admitting: Pulmonary Disease

## 2014-01-12 DIAGNOSIS — R42 Dizziness and giddiness: Secondary | ICD-10-CM

## 2014-01-12 DIAGNOSIS — M9901 Segmental and somatic dysfunction of cervical region: Secondary | ICD-10-CM | POA: Diagnosis not present

## 2014-01-12 DIAGNOSIS — M5412 Radiculopathy, cervical region: Secondary | ICD-10-CM | POA: Diagnosis not present

## 2014-01-12 DIAGNOSIS — R519 Headache, unspecified: Secondary | ICD-10-CM

## 2014-01-12 DIAGNOSIS — R51 Headache: Secondary | ICD-10-CM | POA: Diagnosis not present

## 2014-01-12 LAB — ALDOSTERONE + RENIN ACTIVITY W/ RATIO
ALDO / PRA Ratio: 7.7 Ratio (ref 0.9–28.9)
Aldosterone: 2 ng/dL
PRA LC/MS/MS: 0.26 ng/mL/h (ref 0.25–5.82)

## 2014-01-12 NOTE — Telephone Encounter (Signed)
lmtcb x1 

## 2014-01-12 NOTE — Telephone Encounter (Signed)
Spoke with patient-she states she continues to have a headache but her BP has come down. She is having unsteady gate; denies any acute issues-no CP, chest tightness, dizziness, blurred vision, or radiating pain. Pt is aware to keep her appt with SN on Friday.  Pt stated she was only taking 25mg  of Cozaar TID---pt is aware she needs to take 50mg  TID as stated per SN on 01-11-14. Pt will start this now. Pt was also told if she were to have any new sudden onset symptoms through the night to go to the ER. Otherwise [name] will have SN advise on this message tomorrow morning.

## 2014-01-13 NOTE — Telephone Encounter (Signed)
Per SN---   She will need to be seen by neurology for the headaches and further eval of these.  Use the tramadol and tylenol for now.  thanks

## 2014-01-13 NOTE — Telephone Encounter (Signed)
Poke with the pt and she states that she called neurology and she cannot get in until late December. I advised we will place an order to see if we can get her in sooner. Pt states understanding. Logan Bing, CMA

## 2014-01-14 ENCOUNTER — Ambulatory Visit (INDEPENDENT_AMBULATORY_CARE_PROVIDER_SITE_OTHER): Payer: Medicare Other | Admitting: Pulmonary Disease

## 2014-01-14 ENCOUNTER — Encounter: Payer: Self-pay | Admitting: Pulmonary Disease

## 2014-01-14 ENCOUNTER — Ambulatory Visit: Payer: Medicare Other | Admitting: Physician Assistant

## 2014-01-14 VITALS — BP 168/78 | HR 60 | Temp 97.0°F | Ht 58.5 in | Wt 132.4 lb

## 2014-01-14 DIAGNOSIS — R51 Headache: Secondary | ICD-10-CM | POA: Diagnosis not present

## 2014-01-14 DIAGNOSIS — I10 Essential (primary) hypertension: Secondary | ICD-10-CM | POA: Diagnosis not present

## 2014-01-14 DIAGNOSIS — I2510 Atherosclerotic heart disease of native coronary artery without angina pectoris: Secondary | ICD-10-CM

## 2014-01-14 DIAGNOSIS — R0989 Other specified symptoms and signs involving the circulatory and respiratory systems: Secondary | ICD-10-CM

## 2014-01-14 DIAGNOSIS — M797 Fibromyalgia: Secondary | ICD-10-CM

## 2014-01-14 DIAGNOSIS — M4302 Spondylolysis, cervical region: Secondary | ICD-10-CM | POA: Diagnosis not present

## 2014-01-14 DIAGNOSIS — R519 Headache, unspecified: Secondary | ICD-10-CM

## 2014-01-14 MED ORDER — PREDNISONE (PAK) 5 MG PO TABS: ORAL_TABLET | ORAL | Status: DC

## 2014-01-14 NOTE — Patient Instructions (Signed)
Today we updated your med list in our EPIC system...    Continue your current medications the same...  Your current BP med regimen>>    Metoprolol 50mg  - one tab three times daily...    Losartan 100mg  - 1/2 tab three times daily...    Apresoline 50mg  - 1/2 to 1 tab three times daily as we discussed...  For your Headaches>>    Use the Tramadol 50mg  up to 3 times daily...    And you may add-in TYLENOL 1-2 tabs up to 3 times daily as well...  We decided to use the muscle relaxer 3 times daily as well>>    You have the ZANAFLEX 2mg  and the CYCLOBENZAPRINE 5mg  to see which one works better for you...  As requested we are going to try the PREDNISONE Dosepak for the next 6d to see if this helps as well...  Keep your f/u appt w/ [name] next week...  Call for any questions...  Let's plan a follow up appt to check your progress in 3 weeks time.Marland KitchenMarland Kitchen

## 2014-01-15 ENCOUNTER — Encounter: Payer: Self-pay | Admitting: Pulmonary Disease

## 2014-01-15 NOTE — Progress Notes (Signed)
Subjective:    Patient ID: [name], female    DOB: [birth year], 78 y.o.   MRN: [id number]  HPI 78 y/o WF here for a follow up visit... she has multiple medical problems including AR & Asthma;  HBP;  CAD followed by [name] & s/p 3 vessel CABG 11/11 by DrGearhardt;  Hyperlipidemia followed in the [facility];  GERD/ IBS/ Divertics;  Breast Cancer diagnosed 12/12;  Fibromyalgia;  Chr HAs & dizziness...  SEE PREV EPIC NOTES FOR OLDER DATA >>   ~  March 31, 2012:  22mo ROV & [name] admits that she's not feeling well today- c/o occipHA w/ band-like extension around her head, feels funny- sl dizzy & light headed, occuring daily; she has Tramadol for prn use but she says she doesn't like taking it- prefers Midrin (refilled), offered Neuro/HA referral & she will consider it...     AR/ Asthma>  She stopped the Symbicort as well & not on any inhaled meds at present; denies cough, sput, ch in dyspnea, etc.    HBP>  controlled on Metop50Bid & Hyzaar100-12.5 w/ BP=144/72 today; denies visual changes, CP, palipit, dizziness, syncope, dyspnea, edema, etc...     CAD>  CP 11/11=> cath w/ in-stent restenosis, then CABG x3 by DrGearhardt; re-hospitalized 6/12 by Cards for CP> cath showed severe 3 vessel CAD w/ 3/3 grafts patent & norm LVF w/ EF= 65-70%; Myoview 5/13 showed no ischemia & EF>80%; no change in meds- on Imdur30; & [name] noted that med rx is lim by side effects.    Hyperlipid>  followed in the [place] reviewed on Cres10- 2d per week but she was unable to tolerate even this low dose; off all meds on diet alone...    GI- GERD, Divertics> on Omep40; followed by DrPatterson & seen 9/13 f/u for ?diverticulitis- improved after Cipro/Flagyl but CTAbd was neg x diverticulosis; he rec high fiber diet & Citrucel...    Hx left breast cancer> she had f/u Oncology 12/13- back on Femara & tol ok; last mammogram 10/13 was ok as well...    Others>  she has persistant FM symptoms and chr HAs, uses Vicodin Prn... We  reviewed prob list, meds, xrays and labs> see below for updates >> she had the 2013 Flu vaccine 10/13...   EKG 9/13 showed NSR, rate64, NSSTTWA, NAD...   CXR 10/13 showed heart at upper lim of norm, prior CABG, mild hyperinflation, clear, NAD...  Abd Ultrasound 10/13 by [name] for eval CP showed> norm GB, liver, kidneys, & Ao- neg sonar...  LABS 10-12/13 showed Chems- wnl;  CBC- wnl  LABS 2/14:  FLP- ok x LDL=110;  TSH=1.33   ~  Jul 03, 2012:  28mo ROV & post hospital visit> [facility] 4/22-23/14 after she presented w/ a pill (B-complex vit) aspiration and wheezing, SOB, asthma exac; she was able to cough it all out & did not require bronchoscopy; she denied dysphagia, n/v, etc;  Still not feeling well, min residual cough, notes some neck pain (XRay showed Cspine spondylosis) & she wants MRI=> referral to NS or whatever is necessary; we reviewed Rx w/ Tramadol, Robaxin as well... Her CC today sounds like reactive hypoglycemia & we reviewed diet adjustment- low glycemic index foods, more freq small meals if nec, etc...     We reviewed prob list, meds, xrays and labs> see below for updates >>   CXR 4/14 showed norm heart size, s/p CABG, tort thor Ao, bilat apical pleural scarring, NAD.Marland KitchenMarland Kitchen  ~  September 29, 2012:  109mo ROV & [name] had her MRI CSpine 5/14> multilevel facet hypertrophy, foraminal narrowing & stenosis, disc osteophyte complexes, some central canal stenosis=> she was sent to NS, [name] who rec Mobic & PT (helped alittle) but she was not pleased and is requesting a Rheumatology 2nd opinion about her neck discomfort; notes that insurance won't pay for musc relaxers...     She saw [name] for Cards 5/14> CAD, s/p CABG 2011, hx in-stent restenosis, unable to tol statins (even low dose intermittent rx); Myoview 5/13 was neg- no ischemia & EF>80%; no change in med rx...    She had f/u [name] 6/14> his note is reviewed- s/p lumpectomy, sentinel node, XRT; plan is to continue Femara2.5mg  thru 3/16 for  59yrs rx    She had GYN f/u 6/14 w/ [name]> doing satis x some dryness... We reviewed prob list, meds, xrays and labs> see below for updates >>   ~  February 03, 2013:  67mo ROV & [name] had Rheum consult [name]- last seen 9/14 & note reviewed> neck pain, this exac HAs/migraines, dizzy/ light headed; MRI=> NS consult & not a surg cand & NSAIDs/ PT w/ min benefit; saw Neurology- tried Pamelor but feels hung-over; for her DDD & OA they rec Flex5mg Tid which has helped some & she requests refill today- OK...     She saw [name] 9/14 after ER visit for HA> hx migraines and mixed HAs; CT Brain was neg & MRI CSpine showed multilevel DDD & some foraminal stenosis; Labs were neg including CRP & Sed; they tried Nortrip & Mobic but only min benefit...    She had routine f/u [name] 10/14> Hx left breast cancer dx 10/12 & treated w/ lumpectomy, XRT, & now Femara; exam neg, f/u mammogram neg, rec f/u 44yr... We reviewed prob list, meds, xrays and labs> see below for updates >> BP remains well controlled on her 3 meds; she had the 2014 flu vaccine 10/14; she continues on allergy shots at the [facility]...  ~  August 11, 2013:  79mo ROV & [name] reports that she is stable overall just lack energy so she's eating extra protein to help w/ this she tells me... She has had numerous specialty follow up visits in the interval>>    She had an allergy f/u w/ [name] 3/15> AR on allergy shots, Allegra, Flonase, Saline; she is stable w/o recent exacerbation...    Hx Asthma on XopenexHFA as needed; she has done well w/o asthma exac, cough, sput, wheezing, or SOB...     She had a Cards f/u w/ [name] 1/15> HBP, CAD, HL; on ASA81, Metop50Bid, Hyzaar100-12.5, Imdur30; Hx neg Myoview 5/13 & doing satis- no changes made; BP today= 136/68 & she denies CP, palpit, SOB, dizzy, edema, etc...    Lipids controlled on diet alone (w/ FishOil & CoQ10); Last FLP was 2/14 showing TChol 194, TG 143, HDL 55, LDL 110; reminded of diet, exercise, &  need for Fasting blood work...    She has seen DrHawkes for rheum and [name] for NS in the past> told to f/u prn only...    She had a Neuro f/u 1/15> HAs followed by [name]; neg CTBrain & DDD on MRI Cspine; she refused to take Pamelor & uses Tramadol/ Flexeril as needed; no change in meds needed...     She saw Oncology 6/15 for f/u of her left Breast Cancer> on Femara2.5 & tol well, s/p lumpectomy & sentinel node bx 12/12, followed by XRT finished 3/13, & on Letrozole since then,  no known recurrence...  We reviewed prob list, meds, xrays and labs> see below for updates >>   ~  December 21, 2013:  25mo ROV & add-on requested for bronchitic exacerbation> [name] tells me that she noted the onset of cough, chest congestion, hoarse voice 7 min sore throat ~4d ago; she denied f/c/s but has chest soreness from the coughing 7 bringing up sm amt of yellow-green sput; of note she was recently (3wks ago) treated for a UTI (Proteus sens Cipro) & this resolved... Exam shows borderline BP= 160/78, otherw stable VS w/ O2sat95% on RA, sl pharyngeal erythema, no exudates, ears neg, no adenopathy, hoarse voice, chest exam w/ bilat rhonchi & exp wheezing... CXR shows norm heart size, s/pCABG, clear lungs w/o infiltrates... We discussed Rx w/ Depo80, Levaquin500 x7d, Mucinex 600-2Bid, Fluids, MMW & Delsym prn...     We reviewed prob list, meds, xrays and labs> see below for updates >>   CXR 10/15 showed normal heart size, s/p CABG, unchanged biapical pleural scarring, otherw clear lungs w/o infiltrates, NAD...   ~  December 30, 2013:  1wk ROV & post hosp check>  [name] was [facility] 10/29 - 12/27/13 by [facility] w/ HA, HBP, & hyponatremia (Na=119 ?etiology)> she had recently been treated for bronchitic exac w/ Levaquin & Mucinex, she thinks the HBP was a reaction "allergy" to Levaquin & doesn't want to take this antibiotic again; BP was 220 range in ER &treated w/ Cardene drip=> she was actually disch on less meds than PTA=> sdhe returned to  ER the day after disch w/ HBP & BP ~200 & she hadn't taken her Losartan yet, given IV hyralazine & BP improved to the 160s; now on Metop50Bid & Cozaar50 (prev on Hyzaar100-12.5); they suspected her diuretic in her hyponatremia which corrected w/ IVF; she has a hx of HAs from DJD/neck and FM; We reviewed the following medical problems during today's office visit >>     AR/ Asthma>  not on any inhaled meds at present; denies cough, sput, ch in dyspnea, etc; she has Allegra, Nasonex, Mucinex, Tessalon, MMW for her various symptoms...    HBP>  on Metop50Bid & Losar50 now w/ BP=146/82 today; denies visual changes, CP, palipit, dizziness, syncope, dyspnea, edema, etc; we decided to incr Losartan to 100 & add Hydralazine50-1/2Bid w/ ROV 2 wks...    CAD>  CP 11/11=> cath w/ in-stent restenosis, then CABG x3 by DrGearhardt; re-hospitalized 6/12 by Cards for CP> cath showed severe 3 vessel CAD w/ 3/3 grafts patent & norm LVF w/ EF= 65-70%; Myoview 5/13 showed no ischemia & EF>80%; no change in meds- on Imdur30; & [name] noted that med rx is lim by side effects.    Hyperlipid>  Intol to statins; followed in the [facility] & FLP on Cres10- 2d per week reviewed but she was unable to tolerate even this low dose; off all meds on diet alone now...    GI- GERD, Divertics> on Omep40 + Levsin0.125 & Align; followed by DrPatterson & seen 9/13 f/u for ?diverticulitis- improved after Cipro/Flagyl but CTAbd was neg x diverticulosis; he rec high fiber diet & Citrucel...    Hx left breast cancer> she had f/u Oncology 12/13- on Femara2.5 & tol ok; last mammogram 10/13 was ok as well...    Others>  she has persistant FM symptoms and chr HAs, uses Tramadol & Advil Prn... We reviewed prob list, meds, xrays and labs> see below for updates >>   CXR 10/15 showed fe chr changes in lungs, norm  heart size & s/p CABG, DJD in spine, NAD.Marland KitchenMarland Kitchen  EKG 11/15 showed NSR, rate69, NSSTTWA, NAD...  Lab cumulative summary sheet reviewed...  PLAN>> we  decided to incr Losartan to 100mg /d and add Hydralazine50-1/2Bid, continue Metop50Bid and the Imdur30 as before; she knows to aqvoid excess sodium in her diet (esp now that hyponatremia resolved & she's off diuretic; ROV 2wks...    ~  January 14, 2014:  2wk ROV & another hospitalization in the interval> [name] has had a continuing saga of HA & HBP, she has been to the ER, to Cardiology, Hospitalized, back to Cardiology, and almost daily phone calls for HA & HBP w/ BP ~200 sys> all notes reviewed;  Cards eval for secondary cause of the HBP has been neg;  Initially it was assumed that incr BP resulted in HBP but she has a hx of chronic daily HAs and she continues to have HAs when her BP is controlled, and it seems more likely that the pain contributes to her HBP;  She was last Hosp 11/11 - 01/06/14 and disch on ASA81, Imdur60, Hydralazine50Tid, Losar100-1/2Bid, Metop50Bid;  BP at disch was 100/47;  She saw [name] 11/16 on these meds w/ BP recorded 130/64, Imdur was stopped due to her HAs, they plan f/u 62mo;  She has called daily since then w/ elev BP (esp at night she says) and we have rec strategies using her current meds to help her headaches and adjust BP meds:    HBP> Rec to adjust meds- take Metop50Tid, Losartan100-1/2Tid, Apresoline50- take 1/2 to 1tab Tid (she wants to do this since BP is low in AM & she feels weak, with BP elev in PM & she needs more)...    Chronic Daily HAs> she needs f/u appt w/ Neurology w/ their active management of this problem; she has Zanaflex2mg  & Cyclobenzaprine5mg - asked to try one of these taken Tid & if not improved then try the other Tid; rest, use heating pad on neck, and take Tramadol/ Tylenol up to 3 times daily as needed... We reviewed prob list, meds, xrays and labs> see below for updates >>   CXR 11/15 showed norm heart size, prev CABG, clear lungs- some hyperinflation/ NAD, DJD Tspine...  Renal Sonar 11/15 was wnl...  CT Head 11/15 showed cortical atrophy and  small vessel dis, sinuses clear, NAD.Marland KitchenMarland Kitchen   EKG 11/15 showed NSR, rate64, borderline tracing w/?LAE & min NSSTTWA...  2DEcho 11/15 showed norm cavity size & thickness, norm EF= 60-65%, norm wall motion, norm diastolic function, essentially norm valves...   Myoview 11/15 showed low risk scan w/ abn EKG portion but no stress induced perfusion defects, hyperdynamic LVF w/ EF>70% & no regional wall motion abn  LABS 11/15:  Chems- wnl w/ Cr=0.6;  CBC- ok w/ Hg= 12.3;  TSH=1.60 PLAN>>  take Metop50Tid, Losartan100-1/2Tid, Apresoline50- take 1/2 to 1tab Tid (she wants to do this since BP is low in AM & she feels weak, with BP elev in PM & she needs more); needs f/u appt w/ Neurology w/ their active management of this problem; she has Zanaflex2mg  & Cyclobenzaprine5mg - asked to try one of these taken Tid & if not improved then try the other Tid; rest, use heating pad on neck, and take Tramadol/ Tylenol up to 3 times daily as needed...           Problem List:  GLAUCOMA (ICD-365.9) - she is sched for right cataract surg by DrBevis in Feb...  ALLERGIC RHINITIS (ICD-477.9) - she uses Allegra &  Nasonex Prn; on Allergy shots weekly x yrs... ~  She is followed by [name] at the [place]=> she remains on allergy shots...  ASTHMA & Asthmatic Bronchitis >> off Symbicort & using XOPENEX HFA as needed rescue inhaler... ~  5/11:  notes incr chest symptoms in the heat & rec to take the Symbicort 2sp Bid... ~  2012:  After her CABG & repeat hosp by Cards she is noted to be off her inhalers... ~  10/12:  Treated w/ Zpak, Pred, Mucinex, plus her Symbicort80 & improved... ~  CXR 5/13 showed s/p CABG, clear lungs, NAD (she went to the Er w/ cough & had neg eval). ~  7/13:  [name] stopped her Symbicort & switched to [facility] HFA as needed... ~  2/14: she does not list any inhaled meds at present & states breathing OK w/o exac... ~  4/14: she was hosp after pill asp w/ asthma  exac & improved w/ supportive Rx, didn't require bronch, CXR was clear, grad improved post hosp... ~  CXR 4/14 showed normal heart size, s/p CABG, sl tort Ao, bilat apic pleural scarring, NAD...  ~  6/15: she is stable w/o asthma exac and has avoided URIs/ bronchitis/ etc... ~  10/15: presented w/ URI/ bronchitic exac w/ cough, yellow-green sput, congestion, hoarseness, etc; CXR w/o signs of pneumonia & treated w/ Depo, Levaquin, Mucinex, MMW, Delsym, etc...  ~  CXR 10/15 showed few chr changes in lungs, norm heart size & s/p CABG, DJD in spine, NAD  HYPERTENSION (ICD-401.9) - controlled on TOPROL 50mg Bid & HYZAAR 100-12.5 daily (off Lisinopril due to ACE cough)...  ~  8/12: BP=  128/84 & tol meds well; denies HA, fatigue, visual changes, CP, palipit, syncope, dyspnea, edema, etc... ~  2/13: BP= 140/64 & she remains essentially asymptomatic on Rx... ~  8/13:  BP= 132/64 & she denies CP, palpit, SOB, edema, etc... ~  2/14: controlled on Toprol & Hyzaar w/ BP 144/72 today; denies visual changes, CP, palipit, dizziness, syncope, dyspnea, edema, etc... ~  5/14:  BP= 136/60 & she remains stable... ~  8/14:  BP= 134/60 and she denies CP, palpit, dizzy, SOB, edema... ~  12/14: on Metop50Bid, Hyzaar 100-12.5, Imdur30;  BP= 140/80 & she remains largely asymptomatic... ~  6/15: on ASA81, Metop50Bid, Hyzaar100-12.5, Imdur30; BP today= 136/68 & she denies CP, palpit, SOB, dizzy, edema, etc... ~  10/15: on same meds w/ BP= 160/78 & no changes meds- advised to monitor BP at home once she's over the URI illness, take meds regualrly... ~  11/15: post Hosp for HBP, hyponatremia; they stopped diuretic & disch on Metop50Bid & Losar50; she went back to ER the next day w/ BP~200 (hadn't taken her Losartan) & given IV Hydralazine w/ improvement to 160s; we decided to continue the BBlocker, increase the ARB to 100mg /d, and ADD HYDRALAZINE50- 1/2Bid for now; ROV 2 weeks... ~  11/15: she is s/p ER, Hosp, Cards visits & mult  phone calls (see above)> meds ajusted- take Metop50Tid, Losartan100-1/2Tid, Apresoline50- 1/2 to 1tab Tid (she wants to do this since BP is low in AM & she feels weak, with BP elev in PM & she needs more)...  CORONARY ARTERY DISEASE (ICD-414.00) - on ASA 81mg /d (intol to 325 she says) & off Plavix per [name]...  Cardiolite 9/08 showed CP & HBP response, EF+85%... had cath 10/08 w/ single vessel dis w/ mod ostial LAD stenosis and patent LAD stent, normal LVF.[name]. option for med Rx  vs off pump LIMA... she notes some CP/ pressure "burning" when walking up a certain hill during her daily walks, but states this is stable and "no worse"... ~  Myoview 9/09 was neg- no scar or ischemia, EF= 89%, hypertensive response- Lisinopril10 added. ~  f/u Myoview 3/11 was neg- no scar or ischemia, EF= 86%, norm BP response, fair exerc capacity. ~  saw [name] 5/11- no CP, he stopped her Plavix, stable- continue other meds same. ~  She had recurrent CP 11/11 w/ in-stent restenosis on cath> subseq 3 vessel CABG by DrGearhart & doing well since then... ~  6/12:  Hosp w/ CP & repeat cath showed CAD & 3/3 grafts patent, EF= 65-70% ~  Myoview 5/13 was felt to be a normal stress nuclear study- normal wall motion, no ischemia, EF=84% ~  2/14: CP 11/11=> cath w/ in-stent restenosis, then CABG x3 by DrGearhardt; re-hospitalized 6/12 by Cards for CP> cath showed severe 3 vessel CAD w/ 3/3 grafts patent & norm LVF w/ EF= 65-70%; Myoview 5/13 showed no ischemia & EF>80%; no change in meds; & [name] noted that med rx is limited by side effects... ~  8/14 & 12/14: she remains stable on Imdur30 + Metop50Bid 7 Hyzaar100-12.5; denies CP, palpit, SOB, etc... ~  6/15: Cards f/u w/ [name] 1/15> HBP, CAD, HL; on ASA81, Metop50Bid, Hyzaar100-12.5, Imdur30; Hx neg Myoview 5/13 & doing satis- no changes made; BP today= 136/68 & she denies CP, palpit, SOB, dizzy, edema, etc ~  EKG 11/15 showed NSR, rate69, NSSTTWA, NAD  ~  Myoview 11/15  showed low risk scan w/ abn EKG portion but no stress induced perfusion defects, hyperdynamic LVF w/ EF>70% & no regional wall motion abn. ~  11/15: she is off the Imdur per [name] due to HAs...  HYPERLIPIDEMIA (ICD-272.4) - on CRESTOR 10mg - 1/2 on M&F, FISH OIL 1200mg  Tid + Flax Seed Oil... she has been intol to statins in the past>  tried low dose Trilipix but stopped this due to "thigh burning">  now followed in the Lipid Clinic--- ~  [place] 7/08 showed TChol 217, TG 236, HDL 46, LDL 150...  ~  [place] 6/09 showed TChol 233, TG 263, HDL 46, LDL 150... she agrees to try LOW DOSE Trilipix. ~  FLP 11/09 showed TChol 162, TG 167, HDL 45, LDL 83... ~  [place] 11/10 showed TChol 240, TG 271, HDL 45, LDL 150... LipClin Rx w/ FishOil & FlaxSeedOil. ~  FLP 2/11 showed TChol 285, TG 156, HDL 79, LDL 180... LC is aware & working w/ her regularly. ~  [place] 5/11 showed TChol 277, TG 241, HDL55, LDL 193...  LC added Cres10 just 2d per week. ~  FLP 10/11 showed TChol 198, TG 215, HDL 53, LDL 109... much improved on Cres10 twice weekly. ~  She continues to f/u w/ Lipid clinic & they titrated up the Cres10 on M&F, plus 5mg  on Wednes> ~  FLP  10/12 was the best yet w/ TChol 167, TG 203, HDL 54, LDL 88...  ~  [place] 4/13 showed TChol 193, TG 176, HDL 57, LDL 101 ~  She is still followed in the [facility]- note 6/13 reviewed (at that time on Cres10 on M&F, 5mg  on W) but she subseq cut herself down to 1/2 on M&F due to nausea & aching. ~  FLP 2/14 on diet alone showed TChol 194, TG 143, HDL 55, LDL 110... We reviewed diet, exercise, etc... ~  She remains off the [place] on diet alone and  she declines to restart low dose rx "I can't tolerate them"  GERD (ICD-530.81) - on OMEPRAZOLE 40mg /d... last EGD by DrPatterson 8/07 revealed sm HH, & gastritis and dilatation done... (RUT=neg, PPI Rx)... LEVSIN 0.125mg  helps her esoph spasm... Prn Phenergan for nausea.  DIVERTICULOSIS, COLON (ICD-562.10) & IRRITABLE BOWEL SYNDROME (ICD-564.1) ~   colonoscopy 2/03 by DrPatterson showed divertics and 5 mm polyp... ~  colonoscopy 3/11 showed divertics & cecal polyp= tubular adenoma, w/ f/u planned 80yrs. ~  9/13: seen by DrPatterson f/u for ?diverticulitis- improved after Cipro/Flagyl but CTAbd was neg x diverticulosis; he rec high fiber diet & Citrucel...  ? GALLSTONES>>  Diagnosed by DrPatterson and referred to [facility] DrWilson, pt asked to call prn fopr incr symptoms to consider surg... ~  Abd Ultrasound 10/13 by [name] for eval CP showed> norm GB, liver, kidneys, & Ao- neg sonar.  BREAST CANCER >> Abn mammogram 10/12 showing a left breast nodule & subseq surg (partial mastectomy & sentinel node bx) by [name] 12/12 that proved to be an invasive ductal carcinoma, 2.3cm size, w/ neg lymphovasc invasion, clear margins (but close at 51mm), & neg nodes; she had XRT by DrKindard (had her 11th treatment today); and Oncology eval by [name] who plans hormone Rx later (no chemo she says)...  ~  2/13: She is tolerating treatment well & spirits are up, [name] had to aspirate a seroma & it is now resolved... ~  She is followed by [name], [place], [place]- now on [facility] 2.5mg /d w/ 75yrs planned... ~  6/14:  She had f/u [name] 6/14> his note is reviewed- s/p lumpectomy, sentinel node, XRT; plan is to continue Femara2.5mg  thru 3/16 for 33yrs rx. ~  10/14:  She had routine f/u [name] 10/14> Hx left breast cancer dx 10/12 & treated w/ lumpectomy, XRT, & now Femara; exam neg, f/u mammogram neg, rec f/u 60yr. ~  6/15:  She saw Oncology for f/u of her left Breast Cancer> on Femara2.5 & tol well, s/p lumpectomy & sentinel node bx 12/12, followed by XRT finished 3/13, & on Letrozole since then, no known recurrence...   DJD, Neck Pain >> ~  XRays Cspine 4/14 in hosp showed multilevel cerv spondylosis, 1-6mm anterolisthesis C4 on C5, DDD, foraminal stenosis at C5-6...  ~  5/14:  MRI Cspine => multilevel facet hypertrophy, foraminal narrowing & stenosis, disc  osteophyte complexes, some central canal stenosis=> she was sent to NS, [name] who rec Mobic & PT (helped alittle) but she was not pleased... ~  8/14:  She is requesting a Rheumatology 2nd opinion about her neck discomfort=> seen by [facility] [place]- neck pain, this exac HAs/migraines, dizzy/ light headed; MRI=> NS consult & not a surg cand & NSAIDs/ PT w/ min benefit; saw Neurology- tried Pamelor but feels hung-over; for her DDD & OA they rec Flex5mg Tid which has helped some...  FIBROMYALGIA (ICD-729.1) - she c/o chr fatigue, aching/ sore, on Tramadol & Tylenol, but most benefit from low dose Hydrocodone ~1/2 tab Prn... I have recommended an increase exercise program to her... ~  5/11:  Vit D level = 50 on 50000 u weekly by Helene Shoe, NP  HEADACHE (ICD-784.0) - eval at [facility] clinic in 2002, but she notes Rx didn't help... ~  3/11:  presented w/ muscle contraction HA's> Rx Tramadol 50mg  Q6H Prn. ~  She has persistent/ recurrent HAs... ~  11/15: her chronic daily HAs have again risen in improtance due to interaction w/ her HBP> asked to f/u w/ Neuro ASAP & Rx w/ rest,  heating pad, Tramadol50/ Tylenol, and musc relaxer- Zanaflex2Tid vs Flexeril5Tid...  DIZZINESS, CHRONIC (ICD-780.4) - MRI 2/09 per [name] showed atrophy, sm vessel dis, NAD...  Health Maintenance: ~  GI:  DrPatterson & up to date on colon screening. ~  GYN:  yearly f/u w/ Helene Shoe, NP for PAP, Mammogram at [facility], ?last BMD. ~  Immunizations:  yearly Flu vaccines in fall, Pneumovax in 2007?, TDAP given 5/11...   Past Surgical History  Procedure Laterality Date  . Coronary angioplasty with stent placement      Stent 2007  . Open heart surgery      01/19/2010  . Coronary artery bypass graft    . Cataract extraction, bilateral      bilateral caaract removal,  . Breast lumpectomy Left 02/06/11    Outpatient Encounter Prescriptions as of 01/14/2014  Medication Sig  . aspirin EC 81 MG tablet Take 81 mg by mouth daily.   . fexofenadine (ALLEGRA) 180 MG tablet Take 180 mg by mouth daily.    . fluocinonide (LIDEX) 0.05 % external solution Apply 1 application topically daily as needed (scalp itching).   . Glucosamine-Chondroit-Vit C-Mn (GLUCOSAMINE CHONDR 1500 COMPLX PO) Take 1 tablet by mouth 3 (three) times daily.    . hydrALAZINE (APRESOLINE) 50 MG tablet Take 1 tablet (50 mg total) by mouth 3 (three) times daily. (Patient taking differently: Take 1/2 to 1 tablet by mouth three times daily)  . hyoscyamine (ANASPAZ) 0.125 MG TBDP disintergrating tablet Place 0.125 mg under the tongue every 6 (six) hours as needed for bladder spasms or cramping.  [name] ketoconazole (NIZORAL) 2 % shampoo Apply 1 application topically 2 (two) times a week. As needed for itching a couple of times week  . latanoprost (XALATAN) 0.005 % ophthalmic solution Place 1 drop into both eyes at bedtime.   [name] letrozole (FEMARA) 2.5 MG tablet Take 1 tablet (2.5 mg total) by mouth daily.  . metoprolol (LOPRESSOR) 50 MG tablet Take 1 tablet (50 mg total) by mouth 2 (two) times daily. (Patient taking differently: Take 50 mg by mouth 3 (three) times daily. )  . mometasone (NASONEX) 50 MCG/ACT nasal spray Place 2 sprays into the nose daily as needed (congestion).   . nitroGLYCERIN (NITROSTAT) 0.4 MG SL tablet Place 0.4 mg under the tongue every 5 (five) minutes as needed for chest pain.  . Omega-3 Fatty Acids (FISH OIL) 1200 MG CAPS Take 1,200 mg by mouth daily.   [name] omeprazole (PRILOSEC) 40 MG capsule Take 1 capsule (40 mg total) by mouth daily. 30 minutes before 1st meal of the day  . tiZANidine (ZANAFLEX) 2 MG tablet Take 2 mg by mouth at bedtime.  . traMADol (ULTRAM) 50 MG tablet Take 1 tablet (50 mg total) by mouth 3 (three) times daily as needed.  . vitamin B-12 (CYANOCOBALAMIN) 1000 MCG tablet Take 1,000 mcg by mouth daily.    [name] losartan (COZAAR) 100 MG tablet Take 1/2 tablet by mouth two times daily (Patient taking differently: Take 1/2 tablet by mouth  three times daily)  . predniSONE (STERAPRED UNI-PAK) 5 MG TABS tablet Take as directed    Allergies  Allergen Reactions  . Choline Fenofibrate Other (See Comments)     pt states INTOL to Trilipix w/ "thigh burning"  . Simvastatin Other (See Comments)     pt states INTOL to STATINS \\T \ refuses to restart  . Hctz [Hydrochlorothiazide]     Causes hyponatremia  . Hydrocodone     Nightmare after taking cough syrup  w/hydrocodone  . Levaquin [Levofloxacin In D5w]     Elevated BP  . Adhesive [Tape] Rash  . Ceclor [Cefaclor] Rash  . Clarithromycin Rash  . Codeine Nausea Only  . Doxycycline Rash  . Lisinopril Cough    Developed ACE cough...  . Penicillins Itching and Rash    At injection site  . Tobramycin-Dexamethasone Rash    Current Medications, Allergies, Past Medical History, Past Surgical History, Family History, and Social History were reviewed in [employer] record.    Review of Systems    See HPI - all other systems neg except as noted...  The patient complains of chest pain and headaches.  The patient denies anorexia, fever, weight loss, weight gain, vision loss, decreased hearing, hoarseness, syncope, dyspnea on exertion, peripheral edema, prolonged cough, hemoptysis, abdominal pain, melena, hematochezia, severe indigestion/heartburn, hematuria, incontinence, muscle weakness, suspicious skin lesions, transient blindness, difficulty walking, depression, unusual weight change, abnormal bleeding, enlarged lymph nodes, and angioedema.   Objective:   Physical Exam    WD, WN, 78 y/o WF in NAD... GENERAL:  Alert & oriented; pleasant & cooperative... HEENT:  [place]/AT, EOM-wnl, PERRLA, EACs-clear, TMs-wnl, NOSE-clear, THROAT- clear & wnl. NECK:  Decr ROM; no JVD; normal carotid impulses w/o bruits; no thyromegaly or nodules palpated; no lymphadenopathy. CHEST:  Clear- no wheezing, rales, or signs of consolidation... HEART:  Regular Rhythm; without murmurs/  rubs/ or gallops detected... ABDOMEN:  Soft & nontender; normal bowel sounds; no organomegaly or masses palpated... EXT: without deformities, mild arthritic changes and +trigger points, no varicose veins/ venous insuffic/ or edema. NEURO:  CN's intact; motor testing normal; sensory testing normal; gait normal & balance OK. DERM:  No lesions noted; no rash etc...  RADIOLOGY DATA:  Reviewed in the EPIC EMR & discussed w/ the patient...  LABORATORY DATA:  Reviewed in the EPIC EMR & discussed w/ the patient...   Assessment & Plan:    URI/ Bronchitic exac 10/15>>  Treated w/ Depo, Levaquin, Mucinex, MMW, Delsym... Asthma/ AR>  Stable on allergy shots & XOPENEX as needed; no recent exac... Hx pill asp w/ asthma exac=> resolved...  HBP>  BP elev recently & she thought due to Levaquin, refuses that Ab in the future; meds adjusted several times- Hosp, ER, Cards & now rec Metop50Tid, Losar100-1/2Tid, Hydralazine50-1/2 to 1tab Tid...  CAD>  Cath w/ patent grafts; had neg Nuclear study; continue same meds and f/u [name]...  CHOL>  Followed in the [facility] & intol to all meds- FLP looks better however on diet alone...  GI> GERD, Divertics, ?Gallstones>  Followed by [facility] & stones eval by DrWilson CCS; odd that f/u sonar 9/13 did not show stones...  BREAST CANCER>  on FEMARA > treated by [name], [name], DrKinard & records reviewed...  FM/ HAs/ etc>  On Ultram, Tylenol, etc => needs active management of this prob by Neuro... NECK PAIN> with multilevel spondylosis & eval by NS, [name] but she was not pleased & is asking for Rheum eval...  Other medical issues as noted> on CoQ10, Glucosamine, Vit B12, Vit D, etc...   Patient's Medications  New Prescriptions   PREDNISONE (STERAPRED UNI-PAK) 5 MG TABS TABLET    Take as directed  Previous Medications   ASPIRIN EC 81 MG TABLET    Take 81 mg by mouth daily.   FEXOFENADINE (ALLEGRA) 180 MG TABLET    Take 180 mg by mouth daily.      FLUOCINONIDE (LIDEX) 0.05 % EXTERNAL SOLUTION    Apply 1  application topically daily as needed (scalp itching).    GLUCOSAMINE-CHONDROIT-VIT C-MN (GLUCOSAMINE CHONDR 1500 COMPLX PO)    Take 1 tablet by mouth 3 (three) times daily.     HYDRALAZINE (APRESOLINE) 50 MG TABLET    Take 1 tablet (50 mg total) by mouth 3 (three) times daily.   HYOSCYAMINE (ANASPAZ) 0.125 MG TBDP DISINTERGRATING TABLET    Place 0.125 mg under the tongue every 6 (six) hours as needed for bladder spasms or cramping.   KETOCONAZOLE (NIZORAL) 2 % SHAMPOO    Apply 1 application topically 2 (two) times a week. As needed for itching a couple of times week   LATANOPROST (XALATAN) 0.005 % OPHTHALMIC SOLUTION    Place 1 drop into both eyes at bedtime.    LETROZOLE (FEMARA) 2.5 MG TABLET    Take 1 tablet (2.5 mg total) by mouth daily.   LOSARTAN (COZAAR) 100 MG TABLET    Take 1/2 tablet by mouth two times daily   METOPROLOL (LOPRESSOR) 50 MG TABLET    Take 1 tablet (50 mg total) by mouth 2 (two) times daily.   MOMETASONE (NASONEX) 50 MCG/ACT NASAL SPRAY    Place 2 sprays into the nose daily as needed (congestion).    NITROGLYCERIN (NITROSTAT) 0.4 MG SL TABLET    Place 0.4 mg under the tongue every 5 (five) minutes as needed for chest pain.   OMEGA-3 FATTY ACIDS (FISH OIL) 1200 MG CAPS    Take 1,200 mg by mouth daily.    OMEPRAZOLE (PRILOSEC) 40 MG CAPSULE    Take 1 capsule (40 mg total) by mouth daily. 30 minutes before 1st meal of the day   TIZANIDINE (ZANAFLEX) 2 MG TABLET    Take 2 mg by mouth at bedtime.   TRAMADOL (ULTRAM) 50 MG TABLET    Take 1 tablet (50 mg total) by mouth 3 (three) times daily as needed.   VITAMIN B-12 (CYANOCOBALAMIN) 1000 MCG TABLET    Take 1,000 mcg by mouth daily.    Modified Medications   No medications on file  Discontinued Medications   No medications on file

## 2014-01-17 ENCOUNTER — Telehealth: Payer: Self-pay | Admitting: Cardiovascular Disease

## 2014-01-17 NOTE — Telephone Encounter (Signed)
PT AWARE OF LAB RESULTS./CY 

## 2014-01-17 NOTE — Telephone Encounter (Signed)
PT RTN CALL TO CHRISTINE

## 2014-01-18 ENCOUNTER — Encounter: Payer: Self-pay | Admitting: Neurology

## 2014-01-18 ENCOUNTER — Ambulatory Visit (INDEPENDENT_AMBULATORY_CARE_PROVIDER_SITE_OTHER): Payer: Medicare Other | Admitting: Neurology

## 2014-01-18 VITALS — BP 178/80 | HR 69 | Ht <= 58 in | Wt 130.0 lb

## 2014-01-18 DIAGNOSIS — I2510 Atherosclerotic heart disease of native coronary artery without angina pectoris: Secondary | ICD-10-CM

## 2014-01-18 DIAGNOSIS — I10 Essential (primary) hypertension: Secondary | ICD-10-CM

## 2014-01-18 DIAGNOSIS — R519 Headache, unspecified: Secondary | ICD-10-CM

## 2014-01-18 DIAGNOSIS — R51 Headache: Secondary | ICD-10-CM

## 2014-01-18 DIAGNOSIS — R0989 Other specified symptoms and signs involving the circulatory and respiratory systems: Secondary | ICD-10-CM

## 2014-01-18 NOTE — Patient Instructions (Signed)
Magnesium oxide 400mg Riboflavin 100mg  Twice a day. 

## 2014-01-18 NOTE — Progress Notes (Signed)
[facility]  PATIENT: [name] DOB: 1935-03-18   HISTORY OF PRESENT ILLNESS:[name], 78 year old female returns for followup.   HISTORY: Evaluation of headaches initial visit Sept 2014   She had a past medical history of coronary artery disease, status post a stent, hypertension, hyperlipidemia, she is taking beta blocker metoprolol 50 mg twice a day. she also has a history of breast cancer, status post left lobectomy followed by chemotherapy and radiation therapy in 2012   She reported a history of migraine headaches since age 67s, her typical migraine are occipital area bandlike, severe pounding headache with associated light noise sensitivity, clustered around her menstruation period of time, her headache has quieted down for many years, until about 2014, she began to experience frequent headaches again, somewhat similar to the headache she had previously, bandlike starting from occipital,to bilateral temporal frontal region, pressure, with associated light noise sensitivity.  She reported 15 days out of a month, she would have moderate degree of headaches  She has tried ice and heating pad, which helped her headache some, she is also taking Percocet, Zofran as needed for nausea.  She had CAT scan of the brain that was normal, MRI of cervical spine showed multilevel degenerative disc disease, mild right foraminal stenosis at C3-4, secondary to asymmetric right-sided facet hypertrophy, and joint disease,  She denies jaw claudication, no gait difficulty no diffuse muscle achy pain, no chewing or swallowing difficulties   Previous ESR, was normal, only mild elevated C-reactive protein  UPDATE 01/18/2014:  She was admitted to hospital in October for acute bronchitis, was treated with antibiotics, later was readmitted to the hospital for hyponatremia as low as 118, most recent sodium was 130 in January 24 2014,  CT head without contrast, showed mild atrophy,  small vessel disease, no acute lesions. Echocardiogram was essentially normal.  Over the past 2 months, she began to have frequent headaches again, often starting from occipital area, spreading forward, pressure and burning sensation, lasting 30 minutes, resting was helpful   Ultram as needed for headaches only provides mild help  She was also noted to have elevated blood pressure, to 200, is on 3 agent treatment, hydralazine 50 mg 3 times a day, metoprolol 50 mg 3 times a day, losartan 50 mg twice a day.   REVIEW OF SYSTEMS: Full 14 system review of systems performed and notable only for those listed, all others are neg:   Anxiety, constipation, nausea, cough, chest tightness, dizziness, headaches, insomnia, frequent wakening, daytime sleepiness, snoring   ALLERGIES: Allergies  Allergen Reactions  . Choline Fenofibrate Other (See Comments)     pt states INTOL to Trilipix w/ "thigh burning"  . Simvastatin Other (See Comments)     pt states INTOL to STATINS \T_0 refuses to restart  . Hctz [Hydrochlorothiazide]     Causes hyponatremia  . Hydrocodone     Nightmare after taking cough syrup w/hydrocodone  . Levaquin [Levofloxacin In D5w]     Elevated BP  . Adhesive [Tape] Rash  . Ceclor [Cefaclor] Rash  . Clarithromycin Rash  . Codeine Nausea Only  . Doxycycline Rash  . Lisinopril Cough    Developed ACE cough...  . Penicillins Itching and Rash    At injection site  . Tobramycin-Dexamethasone Rash    HOME MEDICATIONS: Outpatient Prescriptions Prior to Visit  Medication Sig Dispense Refill  . aspirin EC 81 MG tablet Take 81 mg by mouth daily.    . fexofenadine (ALLEGRA) 180 MG tablet Take  180 mg by mouth daily.      . fluocinonide (LIDEX) 0.05 % external solution Apply 1 application topically daily as needed (scalp itching).     . Glucosamine-Chondroit-Vit C-Mn (GLUCOSAMINE CHONDR 1500 COMPLX PO) Take 1 tablet by mouth 3 (three) times daily.      . hydrALAZINE (APRESOLINE) 50 MG  tablet Take 1 tablet (50 mg total) by mouth 3 (three) times daily. (Patient taking differently: Take 1/2 to 1 tablet by mouth three times daily) 45 tablet 6  . hyoscyamine (ANASPAZ) 0.125 MG TBDP disintergrating tablet Place 0.125 mg under the tongue every 6 (six) hours as needed for bladder spasms or cramping.    [name] ketoconazole (NIZORAL) 2 % shampoo Apply 1 application topically 2 (two) times a week. As needed for itching a couple of times week    . latanoprost (XALATAN) 0.005 % ophthalmic solution Place 1 drop into both eyes at bedtime.     [name] letrozole (FEMARA) 2.5 MG tablet Take 1 tablet (2.5 mg total) by mouth daily. 90 tablet 3  . losartan (COZAAR) 100 MG tablet Take 1/2 tablet by mouth two times daily (Patient taking differently: Take 1/2 tablet by mouth three times daily) 30 tablet 6  . metoprolol (LOPRESSOR) 50 MG tablet Take 1 tablet (50 mg total) by mouth 2 (two) times daily. (Patient taking differently: Take 50 mg by mouth 3 (three) times daily. ) 60 tablet 1  . mometasone (NASONEX) 50 MCG/ACT nasal spray Place 2 sprays into the nose daily as needed (congestion).     . nitroGLYCERIN (NITROSTAT) 0.4 MG SL tablet Place 0.4 mg under the tongue every 5 (five) minutes as needed for chest pain.    . Omega-3 Fatty Acids (FISH OIL) 1200 MG CAPS Take 1,200 mg by mouth daily.     [name] omeprazole (PRILOSEC) 40 MG capsule Take 1 capsule (40 mg total) by mouth daily. 30 minutes before 1st meal of the day 30 capsule 6  . predniSONE (STERAPRED UNI-PAK) 5 MG TABS tablet Take as directed 1 each 0  . tiZANidine (ZANAFLEX) 2 MG tablet Take 2 mg by mouth at bedtime.    . traMADol (ULTRAM) 50 MG tablet Take 1 tablet (50 mg total) by mouth 3 (three) times daily as needed. 90 tablet 5  . vitamin B-12 (CYANOCOBALAMIN) 1000 MCG tablet Take 1,000 mcg by mouth daily.       No facility-administered medications prior to visit.    PAST MEDICAL HISTORY: Past Medical History  Diagnosis Date  . Diverticulosis of colon  (without mention of hemorrhage)   . Irritable bowel syndrome   . Colonic polyp 04-27-2009    tubular adenoma  . Headache(784.0)     a. frequently assocaited with high BPs  . Breast cancer, Left 12/20/2010  . Gallstones   . CAD (coronary artery disease)     a. 01/19/2010 s/p CABG x 3, lima->lad, vg->diag, vg->om1;  b. 06/2011 :Lexi MV: EF 84%, No ischemia. c. 01/06/14 s/p negative nuclear stress test with EF >70%  . Fibromyalgia   . Atrophic vaginitis   . GERD (gastroesophageal reflux disease)   . Hypercholesterolemia   . Glaucoma   . Cervical arthritis   . Hiatal hernia   . Labile hypertension     PAST SURGICAL HISTORY: Past Surgical History  Procedure Laterality Date  . Coronary angioplasty with stent placement      Stent 2007  . Open heart surgery      01/19/2010  . Coronary artery bypass graft    .  Cataract extraction, bilateral      bilateral caaract removal,  . Breast lumpectomy Left 02/06/11    FAMILY HISTORY: Family History  Problem Relation Age of Onset  . Heart disease Brother   . Cancer Brother     gland cancer  . Diabetes Sister   . Breast cancer Sister   . Stroke Mother   . Stroke Father   . Colon cancer Neg Hx   . Stomach cancer Neg Hx   . Pancreatic cancer Neg Hx   . Prostate cancer Father   . Kidney disease Neg Hx   . Liver disease Neg Hx     SOCIAL HISTORY: History   Social History  . Marital Status: Married    Spouse Name: Gildardo Griffes. [place]    Number of Children: 2  . Years of Education: 12   Occupational History  . part time preschool teacher-retired    Social History Main Topics  . Smoking status: Never Smoker   . Smokeless tobacco: Never Used  . Alcohol Use: No  . Drug Use: No  . Sexual Activity: Yes    Birth Control/ Protection: Post-menopausal   Other Topics Concern  . Not on file   Social History Narrative   Patient lives at home with her husband Marcello Moores). Patient is retired. Patient  Has 12 th grade education.     Caffeine- sometimes- One cup of coffee.   Right handed.   Four granddaughters.     PHYSICAL EXAM  Filed Vitals:   01/18/14 0849  BP: 178/80  Pulse: 69  Height: 4' 9" (1.448 m)  Weight: 130 lb (58.968 kg)   Body mass index is 28.12 kg/(m^2).  PHYSICAL EXAMINATOINS:  Generalized: In no acute distress  Neck: Supple, no carotid bruits   Cardiac: Regular rate rhythm  Pulmonary: Clear to auscultation bilaterally  Musculoskeletal: No deformity  Neurological examination  Mentation: Alert oriented to time, place, history taking, and causual conversation  Cranial nerve II-XII: Pupils were equal round reactive to light extraocular movements were full, visual field were full on confrontational test.  Bilateral fundi were sharp  Facial sensation and strength were normal. hearing was intact to finger rubbing bilaterally. Uvula tongue midline.  head turning and shoulder shrug and were normal and symmetric.Tongue protrusion into cheek strength was normal.  Motor: normal tone, bulk and strength.  Sensory: Intact to fine touch, pinprick, preserved vibratory sensation, and proprioception at toes.  Coordination: Normal finger to nose, heel-to-shin bilaterally there was no truncal ataxia  Gait: Rising up from seated position without assistance, normal stance, without trunk ataxia, moderate stride, good arm swing, smooth turning, able to perform tiptoe, and heel walking without difficulty.   Romberg signs: Negative  Deep tendon reflexes: Brachioradialis 2/2, biceps 2/2, triceps 2/2, patellar 2/2, Achilles 2/2, plantar responses were flexor bilaterally.      DIAGNOSTIC DATA (LABS, IMAGING, TESTING) - I reviewed patient records, labs, notes, testing and imaging myself where available.  Lab Results  Component Value Date   WBC 10.9* 01/05/2014   HGB 12.3 01/05/2014   HCT 36.2 01/05/2014   MCV 89.2 01/05/2014   PLT 271 01/05/2014      Component Value Date/Time   NA 130* 01/05/2014  1348   NA 132* 07/27/2013 1008   K 5.2 01/05/2014 1348   K 4.6 07/27/2013 1008   CL 91* 01/05/2014 1348   CL 95* 07/28/2012 0941   CO2 22 01/05/2014 1348   CO2 23 07/27/2013 1008   GLUCOSE 100* 01/05/2014  1348   GLUCOSE 88 07/27/2013 1008   GLUCOSE 108* 07/28/2012 0941   BUN 13 01/05/2014 1348   BUN 12.7 07/27/2013 1008   CREATININE 0.59 01/05/2014 1348   CREATININE 0.7 07/27/2013 1008   CALCIUM 9.3 01/05/2014 1348   CALCIUM 9.5 07/27/2013 1008   PROT 7.0 12/27/2013 0404   PROT 7.1 07/27/2013 1008   ALBUMIN 3.7 12/27/2013 0404   ALBUMIN 4.0 07/27/2013 1008   AST 20 12/27/2013 0404   AST 22 07/27/2013 1008   ALT 21 12/27/2013 0404   ALT 23 07/27/2013 1008   ALKPHOS 87 12/27/2013 0404   ALKPHOS 82 07/27/2013 1008   BILITOT 0.8 12/27/2013 0404   BILITOT 1.25* 07/27/2013 1008   GFRNONAA 87* 01/05/2014 1348   GFRAA >90 01/05/2014 1348    ASSESSMENT AND PLAN  78 y.o. year old female  with past medical history of hypertension, migraine headaches, presenting with frequent headaches over the past 2 months, in the setting of acute bronchitis, hyponatremia, elevated blood pressure, her headache most consistent with tension headaches, with mild migraine features    1, she is on polypharmacy treatment already, is hesitate to try more medication as headache prevention, in addition, she was started on prednisone tapering dose  pack recently for headaches 2. Verapamil may be considered for her blood pressure issue, and also as headache prevention 3. I also suggested magnesium oxide, riboflavin, hot compression, neck stretching exercise, resting,  4. Return to clinic in 1-2 month    Marcial Pacas, M.D.

## 2014-01-25 DIAGNOSIS — M5032 Other cervical disc degeneration, mid-cervical region: Secondary | ICD-10-CM | POA: Diagnosis not present

## 2014-01-25 DIAGNOSIS — M5412 Radiculopathy, cervical region: Secondary | ICD-10-CM | POA: Diagnosis not present

## 2014-01-25 DIAGNOSIS — G43611 Persistent migraine aura with cerebral infarction, intractable, with status migrainosus: Secondary | ICD-10-CM | POA: Diagnosis not present

## 2014-01-26 ENCOUNTER — Telehealth: Payer: Self-pay | Admitting: Pulmonary Disease

## 2014-01-26 NOTE — Telephone Encounter (Signed)
Called and spoke with pt and she stated that she is not sure if she is on too much BP meds.  106/59 at times and the highest its been has been 140's/60's.   Pt stated that she has no energy at all and wanted to see if she needs to cut back on one of her meds.  She has been taking these 3 meds TID.  Pt is aware that SN is out of the office until in the morning and she is fine to wait until then.  SN please advise. Thanks  Allergies  Allergen Reactions  . Choline Fenofibrate Other (See Comments)     pt states INTOL to Trilipix w/ "thigh burning"  . Simvastatin Other (See Comments)     pt states INTOL to STATINS \\T \ refuses to restart  . Hctz [Hydrochlorothiazide]     Causes hyponatremia  . Hydrocodone     Nightmare after taking cough syrup w/hydrocodone  . Levaquin [Levofloxacin In D5w]     Elevated BP  . Adhesive [Tape] Rash  . Ceclor [Cefaclor] Rash  . Clarithromycin Rash  . Codeine Nausea Only  . Doxycycline Rash  . Lisinopril Cough    Developed ACE cough...  . Penicillins Itching and Rash    At injection site  . Tobramycin-Dexamethasone Rash    Current Outpatient Prescriptions on File Prior to Visit  Medication Sig Dispense Refill  . aspirin EC 81 MG tablet Take 81 mg by mouth daily.    . fexofenadine (ALLEGRA) 180 MG tablet Take 180 mg by mouth daily.      . fluocinonide (LIDEX) 0.05 % external solution Apply 1 application topically daily as needed (scalp itching).     . Glucosamine-Chondroit-Vit C-Mn (GLUCOSAMINE CHONDR 1500 COMPLX PO) Take 1 tablet by mouth 3 (three) times daily.      . hydrALAZINE (APRESOLINE) 50 MG tablet Take 1 tablet (50 mg total) by mouth 3 (three) times daily. (Patient taking differently: Take 1/2 to 1 tablet by mouth three times daily) 45 tablet 6  . hyoscyamine (ANASPAZ) 0.125 MG TBDP disintergrating tablet Place 0.125 mg under the tongue every 6 (six) hours as needed for bladder spasms or cramping.    [name] ketoconazole (NIZORAL) 2 % shampoo Apply 1  application topically 2 (two) times a week. As needed for itching a couple of times week    . latanoprost (XALATAN) 0.005 % ophthalmic solution Place 1 drop into both eyes at bedtime.     [name] letrozole (FEMARA) 2.5 MG tablet Take 1 tablet (2.5 mg total) by mouth daily. 90 tablet 3  . losartan (COZAAR) 100 MG tablet Take 1/2 tablet by mouth two times daily (Patient taking differently: Take 1/2 tablet by mouth three times daily) 30 tablet 6  . metoprolol (LOPRESSOR) 50 MG tablet Take 1 tablet (50 mg total) by mouth 2 (two) times daily. (Patient taking differently: Take 50 mg by mouth 3 (three) times daily. ) 60 tablet 1  . mometasone (NASONEX) 50 MCG/ACT nasal spray Place 2 sprays into the nose daily as needed (congestion).     . nitroGLYCERIN (NITROSTAT) 0.4 MG SL tablet Place 0.4 mg under the tongue every 5 (five) minutes as needed for chest pain.    . Omega-3 Fatty Acids (FISH OIL) 1200 MG CAPS Take 1,200 mg by mouth daily.     [name] omeprazole (PRILOSEC) 40 MG capsule Take 1 capsule (40 mg total) by mouth daily. 30 minutes before 1st meal of the day 30 capsule 6  .  predniSONE (STERAPRED UNI-PAK) 5 MG TABS tablet Take as directed 1 each 0  . tiZANidine (ZANAFLEX) 2 MG tablet Take 2 mg by mouth at bedtime.    . traMADol (ULTRAM) 50 MG tablet Take 1 tablet (50 mg total) by mouth 3 (three) times daily as needed. 90 tablet 5  . vitamin B-12 (CYANOCOBALAMIN) 1000 MCG tablet Take 1,000 mcg by mouth daily.       No current facility-administered medications on file prior to visit.

## 2014-01-27 NOTE — Telephone Encounter (Signed)
Per SN---  Hydralazine 50 mg  1/2 to 1 TID  rec to take 1/2 tab tid Metoprolol 50 mg  1 TID  Keep the same Losartan 100 mg  1/2 TID keep the same.

## 2014-01-27 NOTE — Telephone Encounter (Signed)
Per SN---  Not a good idea to make too many changes too fast--  recs to monitor the BP for the next couple of days Call if BP <100 or >150.  Called and spoke with pt and she is aware of SN recs and she will do this.  Nothing further is needed.

## 2014-01-27 NOTE — Telephone Encounter (Signed)
Spoke with pt, she states that she has already been cutting hydralazine to .5 tab tid.  Wants to know if SN has any other recs for her.  Thanks!

## 2014-01-28 ENCOUNTER — Telehealth: Payer: Self-pay | Admitting: Pulmonary Disease

## 2014-01-28 NOTE — Telephone Encounter (Signed)
Pt states that she was instructed by SN and leigh to contact them if her bp is greater than 150 or less than 100.  Pt states she checked her bp this morning, was 157/81.  Wants to know why she is to relay this info.  Please advise.  Thank you.

## 2014-02-01 NOTE — Telephone Encounter (Signed)
Called and spoke to pt. Informed pt of the recs per SN. Pt verbalized understanding and denied any further questions or concerns at this time.   

## 2014-02-01 NOTE — Telephone Encounter (Signed)
Per SN---  This is acceptable.  Dr. Lenna Gilford asked her to call to inform us of her BP instead of increasing and decreasing the medications so much.  Previously her BP was 110 and now it is 150 on the same meds.  Ok to continue same meds for now and call if she has any questions.

## 2014-02-04 ENCOUNTER — Encounter: Payer: Self-pay | Admitting: Pulmonary Disease

## 2014-02-04 ENCOUNTER — Ambulatory Visit (INDEPENDENT_AMBULATORY_CARE_PROVIDER_SITE_OTHER): Payer: Medicare Other | Admitting: Pulmonary Disease

## 2014-02-04 VITALS — BP 144/78 | HR 58 | Temp 98.1°F | Ht <= 58 in | Wt 134.6 lb

## 2014-02-04 DIAGNOSIS — I10 Essential (primary) hypertension: Secondary | ICD-10-CM

## 2014-02-04 DIAGNOSIS — K219 Gastro-esophageal reflux disease without esophagitis: Secondary | ICD-10-CM | POA: Diagnosis not present

## 2014-02-04 DIAGNOSIS — I2510 Atherosclerotic heart disease of native coronary artery without angina pectoris: Secondary | ICD-10-CM | POA: Diagnosis not present

## 2014-02-04 DIAGNOSIS — R0989 Other specified symptoms and signs involving the circulatory and respiratory systems: Secondary | ICD-10-CM

## 2014-02-04 DIAGNOSIS — C50512 Malignant neoplasm of lower-outer quadrant of left female breast: Secondary | ICD-10-CM

## 2014-02-04 DIAGNOSIS — K5730 Diverticulosis of large intestine without perforation or abscess without bleeding: Secondary | ICD-10-CM

## 2014-02-04 DIAGNOSIS — K589 Irritable bowel syndrome without diarrhea: Secondary | ICD-10-CM

## 2014-02-04 DIAGNOSIS — M4302 Spondylolysis, cervical region: Secondary | ICD-10-CM

## 2014-02-04 DIAGNOSIS — F411 Generalized anxiety disorder: Secondary | ICD-10-CM

## 2014-02-04 DIAGNOSIS — E782 Mixed hyperlipidemia: Secondary | ICD-10-CM

## 2014-02-04 DIAGNOSIS — J301 Allergic rhinitis due to pollen: Secondary | ICD-10-CM | POA: Diagnosis not present

## 2014-02-04 DIAGNOSIS — R519 Headache, unspecified: Secondary | ICD-10-CM

## 2014-02-04 DIAGNOSIS — J3089 Other allergic rhinitis: Secondary | ICD-10-CM | POA: Diagnosis not present

## 2014-02-04 DIAGNOSIS — R51 Headache: Secondary | ICD-10-CM

## 2014-02-04 MED ORDER — CLONAZEPAM 0.5 MG PO TABS: ORAL_TABLET | ORAL | Status: DC

## 2014-02-04 NOTE — Patient Instructions (Signed)
Today we updated your med list in our EPIC system...    Continue your current medications the same...  Continue your BP meds the same for now & you may titrate the Hydralazine 50mg  from 1/2 to 1 tab three times daily...  We decided to add the [facility] 0.5mg  1/2 to 1 tab twice daily on a regular dosing schedule to see if we can decrease the headaches further 7 the "shaking inside" feeling...  Call for any questions...  Let's plan a follow up visit in 49mo, sooner if needed for problems.Marland KitchenMarland Kitchen

## 2014-02-04 NOTE — Progress Notes (Signed)
Subjective:    Patient ID: [name], female    DOB: [birth year], 78 y.o.   MRN: [id number]  HPI 78 y/o WF here for a follow up visit... she has multiple medical problems including AR & Asthma;  HBP;  CAD followed by [name] & s/p 3 vessel CABG 11/11 by DrGearhardt;  Hyperlipidemia followed in the [facility];  GERD/ IBS/ Divertics;  Breast Cancer diagnosed 12/12;  Fibromyalgia;  Chr HAs & dizziness...  SEE PREV EPIC NOTES FOR OLDER DATA >>   ~  March 31, 2012:  22mo ROV & [name] admits that she's not feeling well today- c/o occipHA w/ band-like extension around her head, feels funny- sl dizzy & light headed, occuring daily; she has Tramadol for prn use but she says she doesn't like taking it- prefers Midrin (refilled), offered Neuro/HA referral & she will consider it...     AR/ Asthma>  She stopped the Symbicort as well & not on any inhaled meds at present; denies cough, sput, ch in dyspnea, etc.    HBP>  controlled on Metop50Bid & Hyzaar100-12.5 w/ BP=144/72 today; denies visual changes, CP, palipit, dizziness, syncope, dyspnea, edema, etc...     CAD>  CP 11/11=> cath w/ in-stent restenosis, then CABG x3 by DrGearhardt; re-hospitalized 6/12 by Cards for CP> cath showed severe 3 vessel CAD w/ 3/3 grafts patent & norm LVF w/ EF= 65-70%; Myoview 5/13 showed no ischemia & EF>80%; no change in meds- on Imdur30; & DrNishan noted that med rx is lim by side effects.    Hyperlipid>  followed in the [place] reviewed on Cres10- 2d per week but she was unable to tolerate even this low dose; off all meds on diet alone...    GI- GERD, Divertics> on Omep40; followed by DrPatterson & seen 9/13 f/u for ?diverticulitis- improved after Cipro/Flagyl but CTAbd was neg x diverticulosis; he rec high fiber diet & Citrucel...    Hx left breast cancer> she had f/u Oncology 12/13- back on Femara & tol ok; last mammogram 10/13 was ok as well...    Others>  she has persistant FM symptoms and chr HAs, uses Vicodin Prn... We  reviewed prob list, meds, xrays and labs> see below for updates >> she had the 2013 Flu vaccine 10/13...   EKG 9/13 showed NSR, rate64, NSSTTWA, NAD...   CXR 10/13 showed heart at upper lim of norm, prior CABG, mild hyperinflation, clear, NAD...  Abd Ultrasound 10/13 by [name] for eval CP showed> norm GB, liver, kidneys, & Ao- neg sonar...  LABS 10-12/13 showed Chems- wnl;  CBC- wnl  LABS 2/14:  FLP- ok x LDL=110;  TSH=1.33   ~  Jul 03, 2012:  28mo ROV & post hospital visit> [facility] 4/22-23/14 after she presented w/ a pill (B-complex vit) aspiration and wheezing, SOB, asthma exac; she was able to cough it all out & did not require bronchoscopy; she denied dysphagia, n/v, etc;  Still not feeling well, min residual cough, notes some neck pain (XRay showed Cspine spondylosis) & she wants MRI=> referral to NS or whatever is necessary; we reviewed Rx w/ Tramadol, Robaxin as well... Her CC today sounds like reactive hypoglycemia & we reviewed diet adjustment- low glycemic index foods, more freq small meals if nec, etc...     We reviewed prob list, meds, xrays and labs> see below for updates >>   CXR 4/14 showed norm heart size, s/p CABG, tort thor Ao, bilat apical pleural scarring, NAD.Marland KitchenMarland Kitchen  ~  September 29, 2012:  109mo ROV & [name] had her MRI CSpine 5/14> multilevel facet hypertrophy, foraminal narrowing & stenosis, disc osteophyte complexes, some central canal stenosis=> she was sent to NS, DrHirsh who rec Mobic & PT (helped alittle) but she was not pleased and is requesting a Rheumatology 2nd opinion about her neck discomfort; notes that insurance won't pay for musc relaxers...     She saw [name] for Cards 5/14> CAD, s/p CABG 2011, hx in-stent restenosis, unable to tol statins (even low dose intermittent rx); Myoview 5/13 was neg- no ischemia & EF>80%; no change in med rx...    She had f/u DrMagrinat 6/14> his note is reviewed- s/p lumpectomy, sentinel node, XRT; plan is to continue Femara2.5mg  thru 3/16 for  59yrs rx    She had GYN f/u 6/14 w/ [name]> doing satis x some dryness... We reviewed prob list, meds, xrays and labs> see below for updates >>   ~  February 03, 2013:  67mo ROV & [name] had Rheum consult [name]- last seen 9/14 & note reviewed> neck pain, this exac HAs/migraines, dizzy/ light headed; MRI=> NS consult & not a surg cand & NSAIDs/ PT w/ min benefit; saw Neurology- tried Pamelor but feels hung-over; for her DDD & OA they rec Flex5mg Tid which has helped some & she requests refill today- OK...     She saw [name] 9/14 after ER visit for HA> hx migraines and mixed HAs; CT Brain was neg & MRI CSpine showed multilevel DDD & some foraminal stenosis; Labs were neg including CRP & Sed; they tried Nortrip & Mobic but only min benefit...    She had routine f/u [name] 10/14> Hx left breast cancer dx 10/12 & treated w/ lumpectomy, XRT, & now Femara; exam neg, f/u mammogram neg, rec f/u 44yr... We reviewed prob list, meds, xrays and labs> see below for updates >> BP remains well controlled on her 3 meds; she had the 2014 flu vaccine 10/14; she continues on allergy shots at the [facility]...  ~  August 11, 2013:  79mo ROV & [name] reports that she is stable overall just lack energy so she's eating extra protein to help w/ this she tells me... She has had numerous specialty follow up visits in the interval>>    She had an allergy f/u w/ DrVanWinkle 3/15> AR on allergy shots, Allegra, Flonase, Saline; she is stable w/o recent exacerbation...    Hx Asthma on XopenexHFA as needed; she has done well w/o asthma exac, cough, sput, wheezing, or SOB...     She had a Cards f/u w/ drNishan 1/15> HBP, CAD, HL; on ASA81, Metop50Bid, Hyzaar100-12.5, Imdur30; Hx neg Myoview 5/13 & doing satis- no changes made; BP today= 136/68 & she denies CP, palpit, SOB, dizzy, edema, etc...    Lipids controlled on diet alone (w/ FishOil & CoQ10); Last FLP was 2/14 showing TChol 194, TG 143, HDL 55, LDL 110; reminded of diet, exercise, &  need for Fasting blood work...    She has seen DrHawkes for rheum and DrHirsh for NS in the past> told to f/u prn only...    She had a Neuro f/u 1/15> HAs followed by [name]; neg CTBrain & DDD on MRI Cspine; she refused to take Pamelor & uses Tramadol/ Flexeril as needed; no change in meds needed...     She saw Oncology 6/15 for f/u of her left Breast Cancer> on Femara2.5 & tol well, s/p lumpectomy & sentinel node bx 12/12, followed by XRT finished 3/13, & on Letrozole since then,  no known recurrence...  We reviewed prob list, meds, xrays and labs> see below for updates >>   ~  December 21, 2013:  25mo ROV & add-on requested for bronchitic exacerbation> [name] tells me that she noted the onset of cough, chest congestion, hoarse voice 7 min sore throat ~4d ago; she denied f/c/s but has chest soreness from the coughing 7 bringing up sm amt of yellow-green sput; of note she was recently (3wks ago) treated for a UTI (Proteus sens Cipro) & this resolved... Exam shows borderline BP= 160/78, otherw stable VS w/ O2sat95% on RA, sl pharyngeal erythema, no exudates, ears neg, no adenopathy, hoarse voice, chest exam w/ bilat rhonchi & exp wheezing... CXR shows norm heart size, s/pCABG, clear lungs w/o infiltrates... We discussed Rx w/ Depo80, Levaquin500 x7d, Mucinex 600-2Bid, Fluids, MMW & Delsym prn...     We reviewed prob list, meds, xrays and labs> see below for updates >>   CXR 10/15 showed normal heart size, s/p CABG, unchanged biapical pleural scarring, otherw clear lungs w/o infiltrates, NAD...   ~  December 30, 2013:  1wk ROV & post hosp check>  [name] was [facility] 10/29 - 12/27/13 by [facility] w/ HA, HBP, & hyponatremia (Na=119 ?etiology)> she had recently been treated for bronchitic exac w/ Levaquin & Mucinex, she thinks the HBP was a reaction "allergy" to Levaquin & doesn't want to take this antibiotic again; BP was 220 range in ER &treated w/ Cardene drip=> she was actually disch on less meds than PTA=> sdhe returned to  ER the day after disch w/ HBP & BP ~200 & she hadn't taken her Losartan yet, given IV hyralazine & BP improved to the 160s; now on Metop50Bid & Cozaar50 (prev on Hyzaar100-12.5); they suspected her diuretic in her hyponatremia which corrected w/ IVF; she has a hx of HAs from DJD/neck and FM; We reviewed the following medical problems during today's office visit >>     AR/ Asthma>  not on any inhaled meds at present; denies cough, sput, ch in dyspnea, etc; she has Allegra, Nasonex, Mucinex, Tessalon, MMW for her various symptoms...    HBP>  on Metop50Bid & Losar50 now w/ BP=146/82 today; denies visual changes, CP, palipit, dizziness, syncope, dyspnea, edema, etc; we decided to incr Losartan to 100 & add Hydralazine50-1/2Bid w/ ROV 2 wks...    CAD>  CP 11/11=> cath w/ in-stent restenosis, then CABG x3 by DrGearhardt; re-hospitalized 6/12 by Cards for CP> cath showed severe 3 vessel CAD w/ 3/3 grafts patent & norm LVF w/ EF= 65-70%; Myoview 5/13 showed no ischemia & EF>80%; no change in meds- on Imdur30; & DrNishan noted that med rx is lim by side effects.    Hyperlipid>  Intol to statins; followed in the [facility] & FLP on Cres10- 2d per week reviewed but she was unable to tolerate even this low dose; off all meds on diet alone now...    GI- GERD, Divertics> on Omep40 + Levsin0.125 & Align; followed by DrPatterson & seen 9/13 f/u for ?diverticulitis- improved after Cipro/Flagyl but CTAbd was neg x diverticulosis; he rec high fiber diet & Citrucel...    Hx left breast cancer> she had f/u Oncology 12/13- on Femara2.5 & tol ok; last mammogram 10/13 was ok as well...    Others>  she has persistant FM symptoms and chr HAs, uses Tramadol & Advil Prn... We reviewed prob list, meds, xrays and labs> see below for updates >>   CXR 10/15 showed fe chr changes in lungs, norm  heart size & s/p CABG, DJD in spine, NAD.Marland KitchenMarland Kitchen  EKG 11/15 showed NSR, rate69, NSSTTWA, NAD...  Lab cumulative summary sheet reviewed...  PLAN>> we  decided to incr Losartan to 100mg /d and add Hydralazine50-1/2Bid, continue Metop50Bid and the Imdur30 as before; she knows to aqvoid excess sodium in her diet (esp now that hyponatremia resolved & she's off diuretic; ROV 2wks...    ~  January 14, 2014:  2wk ROV & another hospitalization in the interval> [name] has had a continuing saga of HA & HBP, she has been to the ER, to Cardiology, Hospitalized, back to Cardiology, and almost daily phone calls for HA & HBP w/ BP ~200 sys> all notes reviewed;  Cards eval for secondary cause of the HBP has been neg;  Initially it was assumed that incr BP resulted in HBP but she has a hx of chronic daily HAs and she continues to have HAs when her BP is controlled, and it seems more likely that the pain contributes to her HBP;  She was last Hosp 11/11 - 01/06/14 and disch on ASA81, Imdur60, Hydralazine50Tid, Losar100-1/2Bid, Metop50Bid;  BP at disch was 100/47;  She saw [name] 11/16 on these meds w/ BP recorded 130/64, Imdur was stopped due to her HAs, they plan f/u 20mo;  She has called daily since then w/ elev BP (esp at night she says) and we have rec strategies using her current meds to help her headaches and adjust BP meds:    HBP> Rec to adjust meds- take Metop50Tid, Losartan100-1/2Tid, Apresoline50- take 1/2 to 1tab Tid (she wants to do this since BP is low in AM & she feels weak, with BP elev in PM & she needs more)...    Chronic Daily HAs> she needs f/u appt w/ Neurology w/ their active management of this problem; she has Zanaflex2mg  & Cyclobenzaprine5mg - asked to try one of these taken Tid & if not improved then try the other Tid; rest, use heating pad on neck, and take Tramadol/ Tylenol up to 3 times daily as needed... We reviewed prob list, meds, xrays and labs> see below for updates >>   CXR 11/15 showed norm heart size, prev CABG, clear lungs- some hyperinflation/ NAD, DJD Tspine...  Renal Sonar 11/15 was wnl...  CT Head 11/15 showed cortical atrophy and  small vessel dis, sinuses clear, NAD.Marland KitchenMarland Kitchen   EKG 11/15 showed NSR, rate64, borderline tracing w/?LAE & min NSSTTWA...  2DEcho 11/15 showed norm cavity size & thickness, norm EF= 60-65%, norm wall motion, norm diastolic function, essentially norm valves...   Myoview 11/15 showed low risk scan w/ abn EKG portion but no stress induced perfusion defects, hyperdynamic LVF w/ EF>70% & no regional wall motion abn  LABS 11/15:  Chems- wnl w/ Cr=0.6;  CBC- ok w/ Hg= 12.3;  TSH=1.60 PLAN>>  take Metop50Tid, Losartan100-1/2Tid, Apresoline50- take 1/2 to 1tab Tid (she wants to do this since BP is low in AM & she feels weak, with BP elev in PM & she needs more); needs f/u appt w/ Neurology w/ their active management of this problem; she has Zanaflex2mg  & Cyclobenzaprine5mg - asked to try one of these taken Tid & if not improved then try the other Tid; rest, use heating pad on neck, and take Tramadol/ Tylenol up to 3 times daily as needed...  ~  February 04, 2014:  3wk ROV & [name] reports improved overall;  BP is better regulated but still sl volatile & she like the ability to adjust the Hydralazine> on Metop50Tid, Cozaar100- 1/2  tid, & Hydralazine50- 1/2to1Tid;  BP today= 144/78 and when it was up>160/90 last PM she took an extra 25mg  dose...  She saw Neuro [name] for her chronic daily HAs 11/15 and she agreed w/ our Rx for BP control + Muscle relaxer (she prefers the Zanaflex over the Flexeril) + Tramadol/ Tylenol for pain...  She is c/o a jitteriness/ shaking on the inside feeling and we discussed trying a regular medication to try to help elim this sensation> try KLONOPIN 0.5mg - 1/2 to 1 tab Bid... She will f/u in 62mo & call sooner prn problems...            Problem List:  GLAUCOMA (ICD-365.9) - she is sched for right cataract surg by DrBevis in Feb...  ALLERGIC RHINITIS (ICD-477.9) - she uses Allegra & Nasonex Prn; on Allergy shots weekly x yrs... ~  She is followed by DrVanWinkle at the [facility],  & sinus care facility=> she remains on allergy shots...  ASTHMA & Asthmatic Bronchitis >> off Symbicort & using XOPENEX HFA as needed rescue inhaler... ~  5/11:  notes incr chest symptoms in the heat & rec to take the Symbicort 2sp Bid... ~  2012:  After her CABG & repeat hosp by Cards she is noted to be off her inhalers... ~  10/12:  Treated w/ Zpak, Pred, Mucinex, plus her Symbicort80 & improved... ~  CXR 5/13 showed s/p CABG, clear lungs, NAD (she went to the Er w/ cough & had neg eval). ~  7/13:  DrVanWinkle stopped her Symbicort & switched to [facility] HFA as needed... ~  2/14: she does not list any inhaled meds at present & states breathing OK w/o exac... ~  4/14: she was hosp after pill asp w/ asthma exac & improved w/ supportive Rx, didn't require bronch, CXR was clear, grad improved post hosp... ~  CXR 4/14 showed normal heart size, s/p CABG, sl tort Ao, bilat apic pleural scarring, NAD...  ~  6/15: she is stable w/o asthma exac and has avoided URIs/ bronchitis/ etc... ~  10/15: presented w/ URI/ bronchitic exac w/ cough, yellow-green sput, congestion, hoarseness, etc; CXR w/o signs of pneumonia & treated w/ Depo, Levaquin, Mucinex, MMW, Delsym, etc...  ~  CXR 10/15 showed few chr changes in lungs, norm heart size & s/p CABG, DJD in spine, NAD  HYPERTENSION (ICD-401.9) - controlled on TOPROL 50mg Bid & HYZAAR 100-12.5 daily (off Lisinopril due to ACE cough)...  ~  8/12: BP=  128/84 & tol meds well; denies HA, fatigue, visual changes, CP, palipit, syncope, dyspnea, edema, etc... ~  2/13: BP= 140/64 & she remains essentially asymptomatic on Rx... ~  8/13:  BP= 132/64 & she denies CP, palpit, SOB, edema, etc... ~  2/14: controlled on Toprol & Hyzaar w/ BP 144/72 today; denies visual changes, CP, palipit, dizziness, syncope, dyspnea, edema, etc... ~  5/14:  BP= 136/60 & she remains stable... ~  8/14:  BP= 134/60 and she denies CP, palpit, dizzy, SOB, edema... ~  12/14: on Metop50Bid, Hyzaar  100-12.5, Imdur30;  BP= 140/80 & she remains largely asymptomatic... ~  6/15: on ASA81, Metop50Bid, Hyzaar100-12.5, Imdur30; BP today= 136/68 & she denies CP, palpit, SOB, dizzy, edema, etc... ~  10/15: on same meds w/ BP= 160/78 & no changes meds- advised to monitor BP at home once she's over the URI illness, take meds regualrly... ~  11/15: post Hosp for HBP, hyponatremia; they stopped diuretic & disch on Metop50Bid & Losar50; she went back to ER the  next day w/ BP~200 (hadn't taken her Losartan) & given IV Hydralazine w/ improvement to 160s; we decided to continue the BBlocker, increase the ARB to 100mg /d, and ADD HYDRALAZINE50- 1/2Bid for now; ROV 2 weeks... ~  11/15: she is s/p ER, Hosp, Cards visits & mult phone calls (see above)> meds ajusted- take Metop50Tid, Losartan100-1/2Tid, Apresoline50- 1/2 to 1tab Tid (she wants to do this since BP is low in AM & she feels weak, with BP elev in PM & she needs more)...  CORONARY ARTERY DISEASE (ICD-414.00) - on ASA 81mg /d (intol to 325 she says) & off Plavix per [name]...  Cardiolite 9/08 showed CP & HBP response, EF+85%... had cath 10/08 w/ single vessel dis w/ mod ostial LAD stenosis and patent LAD stent, normal LVF.[name]. option for med Rx vs off pump LIMA... she notes some CP/ pressure "burning" when walking up a certain hill during her daily walks, but states this is stable and "no worse"... ~  Myoview 9/09 was neg- no scar or ischemia, EF= 89%, hypertensive response- Lisinopril10 added. ~  f/u Myoview 3/11 was neg- no scar or ischemia, EF= 86%, norm BP response, fair exerc capacity. ~  saw [name] 5/11- no CP, he stopped her Plavix, stable- continue other meds same. ~  She had recurrent CP 11/11 w/ in-stent restenosis on cath> subseq 3 vessel CABG by DrGearhart & doing well since then... ~  6/12:  Hosp w/ CP & repeat cath showed CAD & 3/3 grafts patent, EF= 65-70% ~  Myoview 5/13 was felt to be a normal stress nuclear study- normal wall motion, no  ischemia, EF=84% ~  2/14: CP 11/11=> cath w/ in-stent restenosis, then CABG x3 by DrGearhardt; re-hospitalized 6/12 by Cards for CP> cath showed severe 3 vessel CAD w/ 3/3 grafts patent & norm LVF w/ EF= 65-70%; Myoview 5/13 showed no ischemia & EF>80%; no change in meds; & [name] noted that med rx is limited by side effects... ~  8/14 & 12/14: she remains stable on Imdur30 + Metop50Bid 7 Hyzaar100-12.5; denies CP, palpit, SOB, etc... ~  6/15: Cards f/u w/ DrNishan 1/15> HBP, CAD, HL; on ASA81, Metop50Bid, Hyzaar100-12.5, Imdur30; Hx neg Myoview 5/13 & doing satis- no changes made; BP today= 136/68 & she denies CP, palpit, SOB, dizzy, edema, etc ~  EKG 11/15 showed NSR, rate69, NSSTTWA, NAD  ~  Myoview 11/15 showed low risk scan w/ abn EKG portion but no stress induced perfusion defects, hyperdynamic LVF w/ EF>70% & no regional wall motion abn. ~  11/15: she is off the Imdur per DrNishan due to HAs...  HYPERLIPIDEMIA (ICD-272.4) - on CRESTOR 10mg - 1/2 on M&F, FISH OIL 1200mg  Tid + Flax Seed Oil... she has been intol to statins in the past>  tried low dose Trilipix but stopped this due to "thigh burning">  now followed in the Lipid Clinic--- ~  [place] 7/08 showed TChol 217, TG 236, HDL 46, LDL 150...  ~  [place] 6/09 showed TChol 233, TG 263, HDL 46, LDL 150... she agrees to try LOW DOSE Trilipix. ~  FLP 11/09 showed TChol 162, TG 167, HDL 45, LDL 83... ~  [place] 11/10 showed TChol 240, TG 271, HDL 45, LDL 150... LipClin Rx w/ FishOil & FlaxSeedOil. ~  FLP 2/11 showed TChol 285, TG 156, HDL 79, LDL 180... LC is aware & working w/ her regularly. ~  [place] 5/11 showed TChol 277, TG 241, HDL55, LDL 193...  LC added Cres10 just 2d per week. ~  FLP 10/11 showed  TChol 198, TG 215, HDL 53, LDL 109... much improved on Cres10 twice weekly. ~  She continues to f/u w/ Lipid clinic & they titrated up the Cres10 on M&F, plus 5mg  on Wednes> ~  FLP  10/12 was the best yet w/ TChol 167, TG 203, HDL 54, LDL 88...  ~  [place] 4/13  showed TChol 193, TG 176, HDL 57, LDL 101 ~  She is still followed in the [facility]- note 6/13 reviewed (at that time on Cres10 on M&F, 5mg  on W) but she subseq cut herself down to 1/2 on M&F due to nausea & aching. ~  FLP 2/14 on diet alone showed TChol 194, TG 143, HDL 55, LDL 110... We reviewed diet, exercise, etc... ~  She remains off the [place] on diet alone and she declines to restart low dose rx "I can't tolerate them"  GERD (ICD-530.81) - on OMEPRAZOLE 40mg /d... last EGD by DrPatterson 8/07 revealed sm HH, & gastritis and dilatation done... (RUT=neg, PPI Rx)... LEVSIN 0.125mg  helps her esoph spasm... Prn Phenergan for nausea.  DIVERTICULOSIS, COLON (ICD-562.10) & IRRITABLE BOWEL SYNDROME (ICD-564.1) ~  colonoscopy 2/03 by DrPatterson showed divertics and 5 mm polyp... ~  colonoscopy 3/11 showed divertics & cecal polyp= tubular adenoma, w/ f/u planned 59yrs. ~  9/13: seen by DrPatterson f/u for ?diverticulitis- improved after Cipro/Flagyl but CTAbd was neg x diverticulosis; he rec high fiber diet & Citrucel...  ? GALLSTONES>>  Diagnosed by DrPatterson and referred to [facility] DrWilson, pt asked to call prn fopr incr symptoms to consider surg... ~  Abd Ultrasound 10/13 by [name] for eval CP showed> norm GB, liver, kidneys, & Ao- neg sonar.  BREAST CANCER >> Abn mammogram 10/12 showing a left breast nodule & subseq surg (partial mastectomy & sentinel node bx) by [name] 12/12 that proved to be an invasive ductal carcinoma, 2.3cm size, w/ neg lymphovasc invasion, clear margins (but close at 63mm), & neg nodes; she had XRT by DrKindard (had her 11th treatment today); and Oncology eval by DrMagrinat who plans hormone Rx later (no chemo she says)...  ~  2/13: She is tolerating treatment well & spirits are up, [name] had to aspirate a seroma & it is now resolved... ~  She is followed by DrMagrinat, Streck, Kin[place]- now on [facility] 2.5mg /d w/ 61yrs planned... ~  6/14:  She had f/u DrMagrinat 6/14> his note is  reviewed- s/p lumpectomy, sentinel node, XRT; plan is to continue Femara2.5mg  thru 3/16 for 55yrs rx. ~  10/14:  She had routine f/u [name] 10/14> Hx left breast cancer dx 10/12 & treated w/ lumpectomy, XRT, & now Femara; exam neg, f/u mammogram neg, rec f/u 54yr. ~  6/15:  She saw Oncology for f/u of her left Breast Cancer> on Femara2.5 & tol well, s/p lumpectomy & sentinel node bx 12/12, followed by XRT finished 3/13, & on Letrozole since then, no known recurrence...   DJD, Neck Pain >> ~  XRays Cspine 4/14 in hosp showed multilevel cerv spondylosis, 1-36mm anterolisthesis C4 on C5, DDD, foraminal stenosis at C5-6...  ~  5/14:  MRI Cspine => multilevel facet hypertrophy, foraminal narrowing & stenosis, disc osteophyte complexes, some central canal stenosis=> she was sent to NS, DrHirsh who rec Mobic & PT (helped alittle) but she was not pleased... ~  8/14:  She is requesting a Rheumatology 2nd opinion about her neck discomfort=> seen by [facility]- neck pain, this exac HAs/migraines, dizzy/ light headed; MRI=> NS consult & not a surg cand & NSAIDs/ PT w/  min benefit; saw Neurology- tried Pamelor but feels hung-over; for her DDD & OA they rec Flex5mg Tid which has helped some...  FIBROMYALGIA (ICD-729.1) - she c/o chr fatigue, aching/ sore, on Tramadol & Tylenol, but most benefit from low dose Hydrocodone ~1/2 tab Prn... I have recommended an increase exercise program to her... ~  5/11:  Vit D level = 50 on 50000 u weekly by Helene Shoe, NP  HEADACHE (ICD-784.0) - eval at [facility] clinic in 2002, but she notes Rx didn't help... ~  3/11:  presented w/ muscle contraction HA's> Rx Tramadol 50mg  Q6H Prn. ~  She has persistent/ recurrent HAs... ~  11/15: her chronic daily HAs have again risen in improtance due to interaction w/ her HBP> asked to f/u w/ Neuro ASAP & Rx w/ rest, heating pad, Tramadol50/ Tylenol, and musc relaxer- Zanaflex2Tid vs Flexeril5Tid...  DIZZINESS, CHRONIC (ICD-780.4) - MRI 2/09  per [name] showed atrophy, sm vessel dis, NAD...  Health Maintenance: ~  GI:  DrPatterson & up to date on colon screening. ~  GYN:  yearly f/u w/ Helene Shoe, NP for PAP, Mammogram at [facility], ?last BMD. ~  Immunizations:  yearly Flu vaccines in fall, Pneumovax in 2007?, TDAP given 5/11...   Past Surgical History  Procedure Laterality Date  . Coronary angioplasty with stent placement      Stent 2007  . Open heart surgery      01/19/2010  . Coronary artery bypass graft    . Cataract extraction, bilateral      bilateral caaract removal,  . Breast lumpectomy Left 02/06/11    Outpatient Encounter Prescriptions as of 02/04/2014  Medication Sig  . aspirin EC 81 MG tablet Take 81 mg by mouth daily.  . fexofenadine (ALLEGRA) 180 MG tablet Take 180 mg by mouth daily.    . fluocinonide (LIDEX) 0.05 % external solution Apply 1 application topically daily as needed (scalp itching).   . Glucosamine-Chondroit-Vit C-Mn (GLUCOSAMINE CHONDR 1500 COMPLX PO) Take 1 tablet by mouth 3 (three) times daily.    . hydrALAZINE (APRESOLINE) 50 MG tablet Take 1 tablet (50 mg total) by mouth 3 (three) times daily. (Patient taking differently: Take 1/2 to 1 tablet by mouth three times daily)  . hyoscyamine (ANASPAZ) 0.125 MG TBDP disintergrating tablet Place 0.125 mg under the tongue every 6 (six) hours as needed for bladder spasms or cramping.  [name] ketoconazole (NIZORAL) 2 % shampoo Apply 1 application topically 2 (two) times a week. As needed for itching a couple of times week  . latanoprost (XALATAN) 0.005 % ophthalmic solution Place 1 drop into both eyes at bedtime.   [name] letrozole (FEMARA) 2.5 MG tablet Take 1 tablet (2.5 mg total) by mouth daily.  [name] losartan (COZAAR) 100 MG tablet Take 1/2 tablet by mouth two times daily (Patient taking differently: Take 1/2 tablet by mouth three times daily)  . metoprolol (LOPRESSOR) 50 MG tablet Take 1 tablet (50 mg total) by mouth 2 (two) times daily. (Patient taking  differently: Take 50 mg by mouth 3 (three) times daily. )  . mometasone (NASONEX) 50 MCG/ACT nasal spray Place 2 sprays into the nose daily as needed (congestion).   . nitroGLYCERIN (NITROSTAT) 0.4 MG SL tablet Place 0.4 mg under the tongue every 5 (five) minutes as needed for chest pain.  . Omega-3 Fatty Acids (FISH OIL) 1200 MG CAPS Take 1,200 mg by mouth daily.   [name] omeprazole (PRILOSEC) 40 MG capsule Take 1 capsule (40 mg total) by mouth daily. 30 minutes  before 1st meal of the day  . tiZANidine (ZANAFLEX) 2 MG tablet Take 2 mg by mouth at bedtime.  . traMADol (ULTRAM) 50 MG tablet Take 1 tablet (50 mg total) by mouth 3 (three) times daily as needed.  . vitamin B-12 (CYANOCOBALAMIN) 1000 MCG tablet Take 1,000 mcg by mouth daily.    . [DISCONTINUED] predniSONE (STERAPRED UNI-PAK) 5 MG TABS tablet Take as directed (Patient not taking: Reported on 02/04/2014)    Allergies  Allergen Reactions  . Choline Fenofibrate Other (See Comments)     pt states INTOL to Trilipix w/ "thigh burning"  . Simvastatin Other (See Comments)     pt states INTOL to STATINS \\T \ refuses to restart  . Hctz [Hydrochlorothiazide]     Causes hyponatremia  . Hydrocodone     Nightmare after taking cough syrup w/hydrocodone  . Levaquin [Levofloxacin In D5w]     Elevated BP  . Adhesive [Tape] Rash  . Ceclor [Cefaclor] Rash  . Clarithromycin Rash  . Codeine Nausea Only  . Doxycycline Rash  . Lisinopril Cough    Developed ACE cough...  . Penicillins Itching and Rash    At injection site  . Tobramycin-Dexamethasone Rash    Current Medications, Allergies, Past Medical History, Past Surgical History, Family History, and Social History were reviewed in [employer] record.    Review of Systems    See HPI - all other systems neg except as noted...  The patient complains of chest pain and headaches.  The patient denies anorexia, fever, weight loss, weight gain, vision loss, decreased hearing,  hoarseness, syncope, dyspnea on exertion, peripheral edema, prolonged cough, hemoptysis, abdominal pain, melena, hematochezia, severe indigestion/heartburn, hematuria, incontinence, muscle weakness, suspicious skin lesions, transient blindness, difficulty walking, depression, unusual weight change, abnormal bleeding, enlarged lymph nodes, and angioedema.   Objective:   Physical Exam    WD, WN, 78 y/o WF in NAD... GENERAL:  Alert & oriented; pleasant & cooperative... HEENT:  [place]/AT, EOM-wnl, PERRLA, EACs-clear, TMs-wnl, NOSE-clear, THROAT- clear & wnl. NECK:  Decr ROM; no JVD; normal carotid impulses w/o bruits; no thyromegaly or nodules palpated; no lymphadenopathy. CHEST:  Clear- no wheezing, rales, or signs of consolidation... HEART:  Regular Rhythm; without murmurs/ rubs/ or gallops detected... ABDOMEN:  Soft & nontender; normal bowel sounds; no organomegaly or masses palpated... EXT: without deformities, mild arthritic changes and +trigger points, no varicose veins/ venous insuffic/ or edema. NEURO:  CN's intact; motor testing normal; sensory testing normal; gait normal & balance OK. DERM:  No lesions noted; no rash etc...  RADIOLOGY DATA:  Reviewed in the EPIC EMR & discussed w/ the patient...  LABORATORY DATA:  Reviewed in the EPIC EMR & discussed w/ the patient...   Assessment & Plan:    URI/ Bronchitic exac 10/15>>  Prev treated w/ Depo, Levaquin, Mucinex, MMW, Delsym and improved... Asthma/ AR>  Stable on allergy shots & XOPENEX as needed; no recent exac... Hx pill asp w/ asthma exac=> resolved...  HBP>  BP elev recently & she thought due to Levaquin, refuses that Ab in the future; meds adjusted several times- Hosp, ER, Cards & now rec Metop50Tid, Losar100-1/2Tid, Hydralazine50-1/2 to 1tab Tid...  CAD>  Cath w/ patent grafts; had neg Nuclear study; continue same meds and f/u DrNishan...  CHOL>  Followed in [facility] & intol to all meds- FLP looks better however on diet  alone...  GI> GERD, Divertics, ?Gallstones>  Followed by [facility] & stones eval by DrWilson CCS; odd that f/u  sonar 9/13 did not show stones...  BREAST CANCER>  on FEMARA > treated by [name], DrMagrinat, DrKinard & records reviewed...  FM/ HAs/ etc>  On Ultram, Tylenol, etc => needs active management of this prob by Neuro... NECK PAIN> with multilevel spondylosis & eval by NS, DrHirsh but she was not pleased & is asking for Rheum eval...  Other medical issues as noted> on CoQ10, Glucosamine, Vit B12, Vit D, etc...   Patient's Medications  New Prescriptions   CLONAZEPAM (KLONOPIN) 0.5 MG TABLET    Take 1/2 to 1 tablet by mouth BID  Previous Medications   ASPIRIN EC 81 MG TABLET    Take 81 mg by mouth daily.   FEXOFENADINE (ALLEGRA) 180 MG TABLET    Take 180 mg by mouth daily.     FLUOCINONIDE (LIDEX) 0.05 % EXTERNAL SOLUTION    Apply 1 application topically daily as needed (scalp itching).    GLUCOSAMINE-CHONDROIT-VIT C-MN (GLUCOSAMINE CHONDR 1500 COMPLX PO)    Take 1 tablet by mouth 3 (three) times daily.     HYDRALAZINE (APRESOLINE) 50 MG TABLET    Take 1 tablet (50 mg total) by mouth 3 (three) times daily.   HYOSCYAMINE (ANASPAZ) 0.125 MG TBDP DISINTERGRATING TABLET    Place 0.125 mg under the tongue every 6 (six) hours as needed for bladder spasms or cramping.   KETOCONAZOLE (NIZORAL) 2 % SHAMPOO    Apply 1 application topically 2 (two) times a week. As needed for itching a couple of times week   LATANOPROST (XALATAN) 0.005 % OPHTHALMIC SOLUTION    Place 1 drop into both eyes at bedtime.    LETROZOLE (FEMARA) 2.5 MG TABLET    Take 1 tablet (2.5 mg total) by mouth daily.   LOSARTAN (COZAAR) 100 MG TABLET    Take 1/2 tablet by mouth two times daily   METOPROLOL (LOPRESSOR) 50 MG TABLET    Take 1 tablet (50 mg total) by mouth 2 (two) times daily.   MOMETASONE (NASONEX) 50 MCG/ACT NASAL SPRAY    Place 2 sprays into the nose daily as needed (congestion).    NITROGLYCERIN (NITROSTAT)  0.4 MG SL TABLET    Place 0.4 mg under the tongue every 5 (five) minutes as needed for chest pain.   OMEGA-3 FATTY ACIDS (FISH OIL) 1200 MG CAPS    Take 1,200 mg by mouth daily.    OMEPRAZOLE (PRILOSEC) 40 MG CAPSULE    Take 1 capsule (40 mg total) by mouth daily. 30 minutes before 1st meal of the day   TIZANIDINE (ZANAFLEX) 2 MG TABLET    Take 2 mg by mouth at bedtime.   TRAMADOL (ULTRAM) 50 MG TABLET    Take 1 tablet (50 mg total) by mouth 3 (three) times daily as needed.   VITAMIN B-12 (CYANOCOBALAMIN) 1000 MCG TABLET    Take 1,000 mcg by mouth daily.    Modified Medications   No medications on file  Discontinued Medications   PREDNISONE (STERAPRED UNI-PAK) 5 MG TABS TABLET    Take as directed

## 2014-02-07 ENCOUNTER — Telehealth: Payer: Self-pay | Admitting: *Deleted

## 2014-02-07 ENCOUNTER — Ambulatory Visit: Payer: Medicare Other | Admitting: Neurology

## 2014-02-07 ENCOUNTER — Other Ambulatory Visit (INDEPENDENT_AMBULATORY_CARE_PROVIDER_SITE_OTHER): Payer: Medicare Other

## 2014-02-07 ENCOUNTER — Encounter: Payer: Self-pay | Admitting: Gastroenterology

## 2014-02-07 ENCOUNTER — Ambulatory Visit (INDEPENDENT_AMBULATORY_CARE_PROVIDER_SITE_OTHER): Payer: Medicare Other | Admitting: Gastroenterology

## 2014-02-07 ENCOUNTER — Telehealth: Payer: Self-pay | Admitting: Gastroenterology

## 2014-02-07 VITALS — BP 106/54 | HR 68 | Ht <= 58 in | Wt 134.0 lb

## 2014-02-07 DIAGNOSIS — Z8719 Personal history of other diseases of the digestive system: Secondary | ICD-10-CM

## 2014-02-07 DIAGNOSIS — R109 Unspecified abdominal pain: Secondary | ICD-10-CM

## 2014-02-07 DIAGNOSIS — I2510 Atherosclerotic heart disease of native coronary artery without angina pectoris: Secondary | ICD-10-CM | POA: Diagnosis not present

## 2014-02-07 LAB — CBC WITH DIFFERENTIAL/PLATELET
BASOS ABS: 0 10*3/uL (ref 0.0–0.1)
BASOS PCT: 0.3 % (ref 0.0–3.0)
Eosinophils Absolute: 0.1 10*3/uL (ref 0.0–0.7)
Eosinophils Relative: 1.4 % (ref 0.0–5.0)
HEMATOCRIT: 37.3 % (ref 36.0–46.0)
HEMOGLOBIN: 12.4 g/dL (ref 12.0–15.0)
LYMPHS ABS: 2.8 10*3/uL (ref 0.7–4.0)
Lymphocytes Relative: 28.6 % (ref 12.0–46.0)
MCHC: 33.1 g/dL (ref 30.0–36.0)
MCV: 91.6 fl (ref 78.0–100.0)
MONOS PCT: 10.2 % (ref 3.0–12.0)
Monocytes Absolute: 1 10*3/uL (ref 0.1–1.0)
NEUTROS ABS: 5.9 10*3/uL (ref 1.4–7.7)
Neutrophils Relative %: 59.5 % (ref 43.0–77.0)
Platelets: 268 10*3/uL (ref 150.0–400.0)
RBC: 4.07 Mil/uL (ref 3.87–5.11)
RDW: 15.5 % (ref 11.5–15.5)
WBC: 9.9 10*3/uL (ref 4.0–10.5)

## 2014-02-07 LAB — BASIC METABOLIC PANEL
BUN: 11 mg/dL (ref 6–23)
CHLORIDE: 95 meq/L — AB (ref 96–112)
CO2: 28 mEq/L (ref 19–32)
Calcium: 9.1 mg/dL (ref 8.4–10.5)
Creatinine, Ser: 0.6 mg/dL (ref 0.4–1.2)
GFR: 97.54 mL/min (ref >60.00)
Glucose, Bld: 108 mg/dL — ABNORMAL HIGH (ref 70–99)
POTASSIUM: 4.4 meq/L (ref 3.5–5.1)
SODIUM: 130 meq/L — AB (ref 135–145)

## 2014-02-07 MED ORDER — METRONIDAZOLE 500 MG PO TABS: 500.0000 mg | ORAL_TABLET | Freq: Three times a day (TID) | ORAL | Status: DC

## 2014-02-07 MED ORDER — METRONIDAZOLE 250 MG PO TABS: 250.0000 mg | ORAL_TABLET | Freq: Three times a day (TID) | ORAL | Status: DC

## 2014-02-07 MED ORDER — CIPROFLOXACIN HCL 500 MG PO TABS: 500.0000 mg | ORAL_TABLET | Freq: Two times a day (BID) | ORAL | Status: DC

## 2014-02-07 NOTE — Addendum Note (Signed)
Addended by: Hope Pigeon A on: 02/07/2014 03:33 PM   Modules accepted: Orders

## 2014-02-07 NOTE — Telephone Encounter (Signed)
Ok, pt was seen in office today by [name]

## 2014-02-07 NOTE — Telephone Encounter (Signed)
Called patient to advise that we sent Cipro to the pharmacy to take with the Flagyl per Jessica's request. Patient stated that the pharmacist called here and spoke with Jessica's nurse but does not remember the name, to advise patient she should not take Cipro due to allergic reactions to similar antibiotics. Patient said Cipro was cancelled and she does not want to take it. I advised patient she took it in 2013 with no issues, patient refuses Cipro. Jessica aware.

## 2014-02-07 NOTE — Progress Notes (Signed)
Reviewed and agree with management plan.  Carlton Buskey T. Jillienne Egner, MD FACG 

## 2014-02-07 NOTE — Telephone Encounter (Signed)
Patient reports abdominal pain starting 02/05/14. She has loose stool. No blood noted. Off and on nausea, no vomiting or fever. She feels these symptoms are similar to the last time she had diverticulitis.

## 2014-02-07 NOTE — Telephone Encounter (Signed)
Patient stated doing Flagyl at a higher dose will cause stomach issues. I advised Janett Billow per patient higher dose medication will cause patient to have stomach issues. Per Janett Billow please make patient aware that she is only taking one antibiotic not two like they normally would treat diverticulitis.  Per Janett Billow please make patient aware we can go to Flagyl 250 mg but may not be as effective.  Patient aware of Jessica's recommendation, patient wants lower dose antibiotic and patient is aware may not be as effective. Cancelled Flagyl 500 sent 250.

## 2014-02-07 NOTE — Patient Instructions (Signed)
Your physician has requested that you go to the basement for the following lab work before leaving today: CBC BMET  We have sent the following medications to your pharmacy for you to pick up at your convenience: Flagyl 500 mg, please take one tablet by mouth three times daily for ten days _______________________________________________________________________________________________________________________________________________________________________________ You have been scheduled for a CT scan of the abdomen and pelvis at [facility] ([address]---this is in the same building as [facility]).   You are scheduled on 02-11-2014 at 9:30 am. You should arrive 15 minutes prior to your appointment time for registration. Please follow the written instructions below on the day of your exam:  WARNING: IF YOU ARE ALLERGIC TO IODINE/X-RAY DYE, PLEASE NOTIFY RADIOLOGY IMMEDIATELY AT [phone]! YOU WILL BE GIVEN A 13 HOUR PREMEDICATION PREP.  1) Do not eat or drink anything after 5:30 am (4 hours prior to your test) 2) You have been given 2 bottles of oral contrast to drink. The solution may taste better if refrigerated, but do NOT add ice or any other liquid to this solution. Shake well before drinking.    Drink 1 bottle of contrast @ 7:30 am (2 hours prior to your exam)  Drink 1 bottle of contrast @ 8:30 am (1 hour prior to your exam)  You may take any medications as prescribed with a small amount of water except for the following: Metformin, Glucophage, Glucovance, Avandamet, Riomet, Fortamet, Actoplus Met, Janumet, Glumetza or Metaglip. The above medications must be held the day of the exam AND 48 hours after the exam.  The purpose of you drinking the oral contrast is to aid in the visualization of your intestinal tract. The contrast solution may cause some diarrhea. Before your exam is started, you will be given a small amount of fluid to drink. Depending on your  individual set of symptoms, you may also receive an intravenous injection of x-ray contrast/dye. Plan on being at [facility] for 30 minutes or long, depending on the type of exam you are having performed.  This test typically takes 30-45 minutes to complete.  If you have any questions regarding your exam or if you need to reschedule, you may call the CT department at [phone] between the hours of 8:00 am and 5:00 pm, Monday-Friday.  ________________________________________________________________________

## 2014-02-07 NOTE — Telephone Encounter (Signed)
Appointment today for evaluation.

## 2014-02-07 NOTE — Progress Notes (Signed)
     02/07/2014 [name] [id number] [birth year]   History of Present Illness:  This is a 78 year old female who is previously known to Dr. Sharlett Iles and is now going to be Dr. Lynne Leader patient.  She has history of diverticulitis and has been treated on at least 2 other occasions since 2013, although CT scan in 2013 did not show active diverticulitis.  She has levaquin listed as an allergy, but looks like she has tolerated cipro in the past without any issues.    She presents to our office today with complaints of lower abdominal pain and nausea that began suddenly on Saturday evening.  She says that the pain is all across her lower abdomen.  Feels similar to previous diverticulitis episodes in the past.  Stools are loose, no blood.  No vomiting, fever, or chills, but just feels very tired/wiped out.   Current Medications, Allergies, Past Medical History, Past Surgical History, Family History and Social History were reviewed in [employer] record.   Physical Exam: BP 106/54 mmHg  Pulse 68  Ht 4' 9.5" (1.461 m)  Wt 134 lb (60.782 kg)  BMI 28.48 kg/m2  LMP 11/26/1990 General: Well developed white female in no acute distress Head: Normocephalic and atraumatic Eyes:  Sclerae anicteric, conjunctiva pink  Ears: Normal auditory acuity Lungs: Clear throughout to auscultation Heart: Regular rate and rhythm Abdomen: Soft, non-distended.  Normal bowel sounds.  Moderate lower abdominal TTP > in the suprapubic and LLQ regions. Musculoskeletal: Symmetrical with no gross deformities  Extremities: No edema  Neurological: Alert oriented x 4, grossly non-focal Psychological:  Alert and cooperative. Normal mood and affect  Assessment and Recommendations: -Lower abdominal pain and nausea:  Suspect that she has diverticulitis.  I am going to empirically treat her with flagyl 250 mg TID and cipro 500 mg BID for ten days.  I would like to get CT scan evidence, however, since  she has not had any imaging documenting diverticulitis since 2009 and has been treated on at least a few other occasions since then.  Will check CBC and BMP as well.

## 2014-02-08 DIAGNOSIS — M9901 Segmental and somatic dysfunction of cervical region: Secondary | ICD-10-CM | POA: Diagnosis not present

## 2014-02-08 DIAGNOSIS — R51 Headache: Secondary | ICD-10-CM | POA: Diagnosis not present

## 2014-02-08 DIAGNOSIS — M5412 Radiculopathy, cervical region: Secondary | ICD-10-CM | POA: Diagnosis not present

## 2014-02-10 ENCOUNTER — Ambulatory Visit: Payer: Medicare Other | Admitting: Pulmonary Disease

## 2014-02-11 ENCOUNTER — Ambulatory Visit (INDEPENDENT_AMBULATORY_CARE_PROVIDER_SITE_OTHER)
Admission: RE | Admit: 2014-02-11 | Discharge: 2014-02-11 | Disposition: A | Discharge status: Home / Self Care | Payer: Medicare Other | Source: Ambulatory Visit | Attending: Gastroenterology | Admitting: Gastroenterology

## 2014-02-11 DIAGNOSIS — R109 Unspecified abdominal pain: Secondary | ICD-10-CM

## 2014-02-11 DIAGNOSIS — Z8719 Personal history of other diseases of the digestive system: Secondary | ICD-10-CM

## 2014-02-11 DIAGNOSIS — K5732 Diverticulitis of large intestine without perforation or abscess without bleeding: Secondary | ICD-10-CM | POA: Diagnosis not present

## 2014-02-11 MED ORDER — IOHEXOL 300 MG/ML  SOLN
100.0000 mL | Freq: Once | INTRAMUSCULAR | Status: AC | PRN
  Administered 2014-02-11: 100 mL via INTRAVENOUS

## 2014-02-14 ENCOUNTER — Telehealth: Payer: Self-pay | Admitting: Pulmonary Disease

## 2014-02-14 ENCOUNTER — Other Ambulatory Visit: Payer: Self-pay | Admitting: *Deleted

## 2014-02-14 MED ORDER — METOPROLOL TARTRATE 50 MG PO TABS: 50.0000 mg | ORAL_TABLET | Freq: Three times a day (TID) | ORAL | Status: DC

## 2014-02-14 NOTE — Telephone Encounter (Signed)
Rx sent to preferred pharmacy, [facility] [address]. Called and left detailed message that rx has been called in and to call back if needed. Nothing further at this time.

## 2014-02-14 NOTE — Telephone Encounter (Signed)
Called and spoke to pt. Pt requesting refill on metoprolol 50mg  TID. Pt last seen by SN on 02/04/14. In OV note it says pt is taking metoprolol 50 mg BID.  Dr. Lenna Gilford, please advise if pt should be taking metoprolol 50 mg BID or TID.

## 2014-02-14 NOTE — Telephone Encounter (Signed)
Per SN---  Ok to refill the metoprolol tartrate 50 mg   #90  1 po TID with 1 refill.  thanks

## 2014-02-15 DIAGNOSIS — J301 Allergic rhinitis due to pollen: Secondary | ICD-10-CM | POA: Diagnosis not present

## 2014-02-15 DIAGNOSIS — J3089 Other allergic rhinitis: Secondary | ICD-10-CM | POA: Diagnosis not present

## 2014-02-21 DIAGNOSIS — S13130A Subluxation of C2/C3 cervical vertebrae, initial encounter: Secondary | ICD-10-CM | POA: Diagnosis not present

## 2014-02-21 DIAGNOSIS — R42 Dizziness and giddiness: Secondary | ICD-10-CM | POA: Diagnosis not present

## 2014-02-21 DIAGNOSIS — M9901 Segmental and somatic dysfunction of cervical region: Secondary | ICD-10-CM | POA: Diagnosis not present

## 2014-03-01 DIAGNOSIS — M5412 Radiculopathy, cervical region: Secondary | ICD-10-CM | POA: Diagnosis not present

## 2014-03-01 DIAGNOSIS — M9901 Segmental and somatic dysfunction of cervical region: Secondary | ICD-10-CM | POA: Diagnosis not present

## 2014-03-01 DIAGNOSIS — M5032 Other cervical disc degeneration, mid-cervical region: Secondary | ICD-10-CM | POA: Diagnosis not present

## 2014-03-04 DIAGNOSIS — J301 Allergic rhinitis due to pollen: Secondary | ICD-10-CM | POA: Diagnosis not present

## 2014-03-05 ENCOUNTER — Other Ambulatory Visit: Payer: Self-pay | Admitting: Pulmonary Disease

## 2014-03-08 DIAGNOSIS — M9901 Segmental and somatic dysfunction of cervical region: Secondary | ICD-10-CM | POA: Diagnosis not present

## 2014-03-08 DIAGNOSIS — M5412 Radiculopathy, cervical region: Secondary | ICD-10-CM | POA: Diagnosis not present

## 2014-03-08 DIAGNOSIS — M5032 Other cervical disc degeneration, mid-cervical region: Secondary | ICD-10-CM | POA: Diagnosis not present

## 2014-03-10 ENCOUNTER — Ambulatory Visit (INDEPENDENT_AMBULATORY_CARE_PROVIDER_SITE_OTHER): Payer: Medicare Other | Admitting: Neurology

## 2014-03-10 ENCOUNTER — Encounter: Payer: Self-pay | Admitting: Neurology

## 2014-03-10 VITALS — BP 149/72 | HR 65 | Ht 59.0 in | Wt 134.0 lb

## 2014-03-10 DIAGNOSIS — J301 Allergic rhinitis due to pollen: Secondary | ICD-10-CM | POA: Diagnosis not present

## 2014-03-10 DIAGNOSIS — G4730 Sleep apnea, unspecified: Secondary | ICD-10-CM

## 2014-03-10 DIAGNOSIS — J3089 Other allergic rhinitis: Secondary | ICD-10-CM | POA: Diagnosis not present

## 2014-03-10 NOTE — Progress Notes (Signed)
[facility]  PATIENT: [name] DOB: 08-09-1935   HISTORY OF PRESENT ILLNESS:[name], 79 year old female returns for followup.   HISTORY: Evaluation of headaches initial visit Sept 2014   She had a past medical history of coronary artery disease, status post  stent, hypertension, hyperlipidemia, she is taking beta blocker metoprolol 50 mg twice a day. she also has a history of breast cancer, status post left lobectomy followed by chemotherapy and radiation therapy in 2012   She reported a history of migraine headaches since age 60s, her typical migraine are occipital area bandlike, severe pounding headache with associated light noise sensitivity, clustered around her menstruation period of time, her headache has quieted down for many years, until about 2014, she began to experience frequent headaches again, somewhat similar to the headache she had previously, bandlike starting from occipital,to bilateral temporal frontal region, pressure, with associated light noise sensitivity.   She reported 15 days out of a month, she would have moderate degree of headaches   She has tried ice and heating pad, which helped her headache some, she is also taking Percocet, Zofran as needed for nausea.  She had CAT scan of the brain that was normal, MRI of cervical spine showed multilevel degenerative disc disease, mild right foraminal stenosis at C3-4, secondary to asymmetric right-sided facet hypertrophy, and joint disease,  She denies jaw claudication, no gait difficulty no diffuse muscle achy pain, no chewing or swallowing difficulties   Previous ESR, was normal, only mild elevated C-reactive protein  UPDATE 01/18/2014:  She was admitted to hospital in October 2015 for acute bronchitis, was treated with antibiotics, later was readmitted to the hospital for hyponatremia as low as 118, most recent sodium was 130 in January 24 2014,  CT head without contrast, showed mild  atrophy, small vessel disease, no acute lesions. Echocardiogram was essentially normal.  Over the past 2 months, she began to have frequent headaches again, often starting from occipital area, spreading forward, pressure and burning sensation, lasting 30 minutes, resting was helpful   Ultram as needed for headaches only provides mild help  She was also noted to have elevated blood pressure, to 200, is on 3 agent treatment, hydralazine 50 mg 3 times a day, metoprolol 50 mg 3 times a day, losartan 50 mg twice a day.  UPDATE Jan 14th 2016: She continue have frequent early morning headaches, bandlike sensation around her vertex region, 5 out of 10, sometimes can go up to 10 out of 10, she is taking tramadol, Tylenol PM as needed she also complains of frequent awakening at nighttime,  nocturia dry mouth, snoring, ESS score is 14, F S S score is 26   REVIEW OF SYSTEMS: Full 14 system review of systems performed and notable only for those listed, all others are neg:  Dizziness.   ALLERGIES: Allergies  Allergen Reactions  . Choline Fenofibrate Other (See Comments)     pt states INTOL to Trilipix w/ "thigh burning"  . Simvastatin Other (See Comments)     pt states INTOL to STATINS$RemoveBeforeD' \\T'[id number]$ \ refuses to restart  . Hctz [Hydrochlorothiazide]     Causes hyponatremia  . Hydrocodone     Nightmare after taking cough syrup w/hydrocodone  . Levaquin [Levofloxacin In D5w]     Elevated BP  . Adhesive [Tape] Rash  . Ceclor [Cefaclor] Rash  . Clarithromycin Rash  . Codeine Nausea Only  . Doxycycline Rash  . Lisinopril Cough    Developed ACE cough...  . Penicillins  Itching and Rash    At injection site  . Tobramycin-Dexamethasone Rash    HOME MEDICATIONS: Outpatient Prescriptions Prior to Visit  Medication Sig Dispense Refill  . aspirin EC 81 MG tablet Take 81 mg by mouth daily.    . ciprofloxacin (CIPRO) 500 MG tablet Take 1 tablet (500 mg total) by mouth 2 (two) times daily. 20 tablet 0  .  cyclobenzaprine (FLEXERIL) 5 MG tablet take 1/2 to 1 tablet by mouth twice a day if needed 60 tablet 5  . fexofenadine (ALLEGRA) 180 MG tablet Take 180 mg by mouth daily.      . fluocinonide (LIDEX) 0.05 % external solution Apply 1 application topically daily as needed (scalp itching).     . Glucosamine-Chondroit-Vit C-Mn (GLUCOSAMINE CHONDR 1500 COMPLX PO) Take 1 tablet by mouth 3 (three) times daily.      . hydrALAZINE (APRESOLINE) 50 MG tablet Take 1 tablet (50 mg total) by mouth 3 (three) times daily. (Patient taking differently: Take 1/2 to 1 tablet by mouth three times daily) 45 tablet 6  . hyoscyamine (ANASPAZ) 0.125 MG TBDP disintergrating tablet Place 0.125 mg under the tongue every 6 (six) hours as needed for bladder spasms or cramping.    [name] ketoconazole (NIZORAL) 2 % shampoo Apply 1 application topically 2 (two) times a week. As needed for itching a couple of times week    . latanoprost (XALATAN) 0.005 % ophthalmic solution Place 1 drop into both eyes at bedtime.     [name] letrozole (FEMARA) 2.5 MG tablet Take 1 tablet (2.5 mg total) by mouth daily. 90 tablet 3  . losartan (COZAAR) 100 MG tablet Take 1/2 tablet by mouth two times daily (Patient taking differently: Take 1/2 tablet by mouth three  times daily) 30 tablet 6  . metoprolol (LOPRESSOR) 50 MG tablet Take 1 tablet (50 mg total) by mouth 3 (three) times daily. 90 tablet 1  . metroNIDAZOLE (FLAGYL) 250 MG tablet Take 1 tablet (250 mg total) by mouth 3 (three) times daily. 30 tablet 0  . mometasone (NASONEX) 50 MCG/ACT nasal spray Place 2 sprays into the nose daily as needed (congestion).     . nitroGLYCERIN (NITROSTAT) 0.4 MG SL tablet Place 0.4 mg under the tongue every 5 (five) minutes as needed for chest pain.    . Omega-3 Fatty Acids (FISH OIL) 1200 MG CAPS Take 1,200 mg by mouth daily.     [name] omeprazole (PRILOSEC) 40 MG capsule Take 1 capsule (40 mg total) by mouth daily. 30 minutes before 1st meal of the day 30 capsule 6  .  tiZANidine (ZANAFLEX) 2 MG tablet Take 2 mg by mouth at bedtime.    . traMADol (ULTRAM) 50 MG tablet Take 1 tablet (50 mg total) by mouth 3 (three) times daily as needed. 90 tablet 5  . vitamin B-12 (CYANOCOBALAMIN) 1000 MCG tablet Take 1,000 mcg by mouth daily.      . clonazePAM (KLONOPIN) 0.5 MG tablet Take 1/2 to 1 tablet by mouth BID 60 tablet 5   No facility-administered medications prior to visit.    PAST MEDICAL HISTORY: Past Medical History  Diagnosis Date  . Diverticulosis of colon (without mention of hemorrhage)   . Irritable bowel syndrome   . Colonic polyp 04-27-2009    tubular adenoma  . Headache(784.0)     a. frequently assocaited with high BPs  . Breast cancer, Left 12/20/2010    NO BLOOD PRESSURE CHECKS OR STICKS IN LEFT ARM  . Gallstones   .  CAD (coronary artery disease)     a. 01/19/2010 s/p CABG x 3, lima->lad, vg->diag, vg->om1;  b. 06/2011 :Lexi MV: EF 84%, No ischemia. c. 01/06/14 s/p negative nuclear stress test with EF >70%  . Fibromyalgia   . Atrophic vaginitis   . GERD (gastroesophageal reflux disease)   . Hypercholesterolemia   . Glaucoma   . Cervical arthritis   . Hiatal hernia   . Labile hypertension     PAST SURGICAL HISTORY: Past Surgical History  Procedure Laterality Date  . Coronary angioplasty with stent placement      Stent 2007  . Open heart surgery      01/19/2010  . Coronary artery bypass graft    . Cataract extraction, bilateral      bilateral caaract removal,  . Breast lumpectomy Left 02/06/11    FAMILY HISTORY: Family History  Problem Relation Age of Onset  . Heart disease Brother   . Cancer Brother     gland cancer  . Diabetes Sister   . Breast cancer Sister   . Stroke Mother   . Stroke Father   . Colon cancer Neg Hx   . Stomach cancer Neg Hx   . Pancreatic cancer Neg Hx   . Prostate cancer Father   . Kidney disease Neg Hx   . Liver disease Neg Hx     SOCIAL HISTORY: History   Social History  . Marital Status:  Married    Spouse Name: Gildardo Griffes. Murthy    Number of Children: 2  . Years of Education: 12   Occupational History  . part time preschool teacher-retired    Social History Main Topics  . Smoking status: Never Smoker   . Smokeless tobacco: Never Used  . Alcohol Use: No  . Drug Use: No  . Sexual Activity: Yes    Birth Control/ Protection: Post-menopausal   Other Topics Concern  . Not on file   Social History Narrative   Patient lives at home with her husband Marcello Moores). Patient is retired. Patient  Has 12 th grade education.    Caffeine- sometimes- One cup of coffee.   Right handed.   Four granddaughters.     PHYSICAL EXAM  Filed Vitals:   03/10/14 1352  BP: 149/72  Pulse: 65  Height: $Remove'4\' 11"'RZpUgpR$  (1.499 m)  Weight: 134 lb (60.782 kg)   Body mass index is 27.05 kg/(m^2).  PHYSICAL EXAMINATOINS:  Generalized: In no acute distress  Neck: Supple, no carotid bruits   Cardiac: Regular rate rhythm  Pulmonary: Clear to auscultation bilaterally  Musculoskeletal: No deformity  Neurological examination  Mentation: Alert oriented to time, place, history taking, and causual conversation  Cranial nerve II-XII: Pupils were equal round reactive to light extraocular movements were full, visual field were full on confrontational test.  Bilateral fundi were sharp  Facial sensation and strength were normal. hearing was intact to finger rubbing bilaterally. Uvula tongue midline.  head turning and shoulder shrug and were normal and symmetric.Tongue protrusion into cheek strength was normal. Narrow oropharyngeal space  Motor: normal tone, bulk and strength.  Sensory: Intact to fine touch, pinprick, preserved vibratory sensation, and proprioception at toes.  Coordination: Normal finger to nose, heel-to-shin bilaterally there was no truncal ataxia  Gait: Rising up from seated position without assistance, normal stance, without trunk ataxia, moderate stride, good arm swing, smooth  turning, able to perform tiptoe, and heel walking without difficulty.   Romberg signs: Negative  Deep tendon reflexes: Brachioradialis 2/2, biceps 2/2, triceps  2/2, patellar 2/2, Achilles 2/2, plantar responses were flexor bilaterally.      DIAGNOSTIC DATA (LABS, IMAGING, TESTING) - I reviewed patient records, labs, notes, testing and imaging myself where available.  Lab Results  Component Value Date   WBC 9.9 02/07/2014   HGB 12.4 02/07/2014   HCT 37.3 02/07/2014   MCV 91.6 02/07/2014   PLT 268.0 02/07/2014      Component Value Date/Time   NA 130* 02/07/2014 1504   NA 132* 07/27/2013 1008   K 4.4 02/07/2014 1504   K 4.6 07/27/2013 1008   CL 95* 02/07/2014 1504   CL 95* 07/28/2012 0941   CO2 28 02/07/2014 1504   CO2 23 07/27/2013 1008   GLUCOSE 108* 02/07/2014 1504   GLUCOSE 88 07/27/2013 1008   GLUCOSE 108* 07/28/2012 0941   BUN 11 02/07/2014 1504   BUN 12.7 07/27/2013 1008   CREATININE 0.6 02/07/2014 1504   CREATININE 0.7 07/27/2013 1008   CALCIUM 9.1 02/07/2014 1504   CALCIUM 9.5 07/27/2013 1008   PROT 7.0 12/27/2013 0404   PROT 7.1 07/27/2013 1008   ALBUMIN 3.7 12/27/2013 0404   ALBUMIN 4.0 07/27/2013 1008   AST 20 12/27/2013 0404   AST 22 07/27/2013 1008   ALT 21 12/27/2013 0404   ALT 23 07/27/2013 1008   ALKPHOS 87 12/27/2013 0404   ALKPHOS 82 07/27/2013 1008   BILITOT 0.8 12/27/2013 0404   BILITOT 1.25* 07/27/2013 1008   GFRNONAA 87* 01/05/2014 1348   GFRAA >90 01/05/2014 1348    ASSESSMENT AND PLAN  79 y.o. year old female  with past medical history of hypertension, migraine headaches, presenting with frequent headaches over the past 2 months, in the setting of acute bronchitis, hyponatremia, elevated blood pressure, her headache most consistent with tension headaches, with mild migraine features, she also complains of excessive daytime sleepiness, ESS score was 14, FSS score was 26, she has very narrow oropharyngeal, nighttime frequent awakening,  early morning headaches, suggestive of option of sleep apnea  Sleep study refer  Orders Placed This Encounter  Procedures  . Ambulatory referral to Sleep Studies  Return in about 6 months (around 09/08/2014).  Marcial Pacas, M.D. Ph.D.  [facility] [place], [place] [address] Phone: [phone] Fax:      [phone]

## 2014-03-11 ENCOUNTER — Telehealth: Payer: Self-pay | Admitting: Neurology

## 2014-03-11 DIAGNOSIS — R51 Headache: Secondary | ICD-10-CM

## 2014-03-11 DIAGNOSIS — G4719 Other hypersomnia: Secondary | ICD-10-CM

## 2014-03-11 DIAGNOSIS — E663 Overweight: Secondary | ICD-10-CM

## 2014-03-11 DIAGNOSIS — R0683 Snoring: Secondary | ICD-10-CM

## 2014-03-11 DIAGNOSIS — R519 Headache, unspecified: Secondary | ICD-10-CM

## 2014-03-11 DIAGNOSIS — R351 Nocturia: Secondary | ICD-10-CM

## 2014-03-11 DIAGNOSIS — I2510 Atherosclerotic heart disease of native coronary artery without angina pectoris: Secondary | ICD-10-CM

## 2014-03-11 NOTE — Telephone Encounter (Signed)
Sleep study request review: This patient has an underlying medical history of diverticulosis, IBS, breast cancer, coronary artery disease, status post CABG, fibromyalgia, reflux disease, hyperlipidemia, glaucoma, hypertension and overweight state and is referred by Dr. Krista Blue for an attended sleep study due to a report of morning headaches, nocturia, snoring and excessive daytime somnolence with an Epworth sleepiness score of 14. I will order a split-night sleep study and see the patient in sleep medicine consultation afterwards. Please print this note and attach to chart.   Technologist instructions: Please score at 4% and split if 2 hour estimated AHI >15/h.    Star Age, MD, PhD [facility])

## 2014-03-11 NOTE — Telephone Encounter (Signed)
Dr. Marcial Pacas, refers patient for attended sleep study.  Height: 4'11"  Weight: 134 lb  BMI: 27.05  Past Medical History:   Diverticulosis of colon (without mention of hemorrhage)   . Irritable bowel syndrome   . Colonic polyp 04-27-2009    tubular adenoma  . Headache(784.0)     a. frequently assocaited with high BPs  . Breast cancer, Left 12/20/2010    NO BLOOD PRESSURE CHECKS OR STICKS IN LEFT ARM  . Gallstones   . CAD (coronary artery disease)     a. 01/19/2010 s/p CABG x 3, lima->lad, vg->diag, vg->om1; b. 06/2011 :Lexi MV: EF 84%, No ischemia. c. 01/06/14 s/p negative nuclear stress test with EF >70%  . Fibromyalgia   . Atrophic vaginitis   . GERD (gastroesophageal reflux disease)   . Hypercholesterolemia   . Glaucoma   . Cervical arthritis   . Hiatal hernia   . Labile hypertension      Sleep Symptoms: early morning headaches, frequent awakening at nighttime, nocturia dry mouth, snoring   Epworth Score: ESS is 14   Medication:  Aspirin (Tablet Delayed Response) aspirin EC 81 MG Take 81 mg by mouth daily.       Cyanocobalamin (Tab) CYANOCOBALAMIN 1000 MCG Take 1,000 mcg by mouth daily.       Cyclobenzaprine HCl (Tab) FLEXERIL 5 MG take 1/2 to 1 tablet by mouth twice a day if needed      Fexofenadine HCl (Tab) ALLEGRA 180 MG Take 180 mg by mouth daily.       Fluocinonide (Solution) LIDEX 9.02 % Apply 1 application topically daily as needed (scalp itching).       Glucosamine-Chondroit-Vit C-Mn   Take 1 tablet by mouth 3 (three) times daily.       HydrALAZINE HCl (Tab) APRESOLINE 50 MG Take 1 tablet (50 mg total) by mouth 3 (three) times daily.      Hyoscyamine Sulfate (Tablet Dispersible) ANASPAZ 0.125 MG Place 0.125 mg under the tongue every 6 (six) hours as needed for bladder spasms or cramping.      Ketoconazole (Shampoo) NIZORAL 2 % Apply 1 application topically 2 (two) times a week.  As needed for itching a couple of times week      Latanoprost (Solution) XALATAN 0.005 % Place 1 drop into both eyes at bedtime.       Letrozole (Tab) FEMARA 2.5 MG Take 1 tablet (2.5 mg total) by mouth daily.      Losartan Potassium (Tab) COZAAR 100 MG Take 1/2 tablet by mouth two times daily      Metoprolol Tartrate (Tab) LOPRESSOR 50 MG Take 1 tablet (50 mg total) by mouth 3 (three) times daily.      Mometasone Furoate (Suspension) NASONEX 50 MCG/ACT Place 2 sprays into the nose daily as needed (congestion).       Nitroglycerin (SL Tab) NITROSTAT 0.4 MG Place 0.4 mg under the tongue every 5 (five) minutes as needed for chest pain.      Omega-3 Fatty Acids (Cap) Fish Oil 1200 MG Take 1,200 mg by mouth daily.       Omeprazole (Capsule Delayed Release) PRILOSEC 40 MG Take 1 capsule (40 mg total) by mouth daily. 30 minutes before 1st meal of the day      TiZANidine HCl (Tab) ZANAFLEX 2 MG Take 2 mg by mouth at bedtime.      TraMADol HCl (Tab) ULTRAM 50 MG Take 1 tablet (50 mg total) by mouth 3 (three) times daily as  needed.       Ins:  [employer]   Assessment & Plan:  79 y.o. year old female with past medical history of hypertension, migraine headaches, presenting with frequent headaches over the past 2 months, in the setting of acute bronchitis, hyponatremia, elevated blood pressure, her headache most consistent with tension headaches, with mild migraine features, she also complains of excessive daytime sleepiness, ESS score was 14, FSS score was 26, she has very narrow oropharyngeal, nighttime frequent awakening, early morning headaches, suggestive of option of sleep apnea  Sleep study refer  Please review patient information and submit instructions for scheduling and orders for sleep technologist. Thank you.

## 2014-03-22 DIAGNOSIS — M9901 Segmental and somatic dysfunction of cervical region: Secondary | ICD-10-CM | POA: Diagnosis not present

## 2014-03-22 DIAGNOSIS — M5412 Radiculopathy, cervical region: Secondary | ICD-10-CM | POA: Diagnosis not present

## 2014-03-22 DIAGNOSIS — R51 Headache: Secondary | ICD-10-CM | POA: Diagnosis not present

## 2014-03-22 DIAGNOSIS — M5032 Other cervical disc degeneration, mid-cervical region: Secondary | ICD-10-CM | POA: Diagnosis not present

## 2014-03-23 ENCOUNTER — Encounter: Payer: Self-pay | Admitting: Cardiovascular Disease

## 2014-03-23 ENCOUNTER — Ambulatory Visit (INDEPENDENT_AMBULATORY_CARE_PROVIDER_SITE_OTHER): Payer: Medicare Other | Admitting: Cardiovascular Disease

## 2014-03-23 VITALS — BP 140/72 | HR 66 | Ht 59.0 in | Wt 133.8 lb

## 2014-03-23 DIAGNOSIS — J301 Allergic rhinitis due to pollen: Secondary | ICD-10-CM | POA: Diagnosis not present

## 2014-03-23 DIAGNOSIS — I10 Essential (primary) hypertension: Secondary | ICD-10-CM | POA: Diagnosis not present

## 2014-03-23 DIAGNOSIS — R519 Headache, unspecified: Secondary | ICD-10-CM

## 2014-03-23 DIAGNOSIS — I161 Hypertensive emergency: Secondary | ICD-10-CM

## 2014-03-23 DIAGNOSIS — I2510 Atherosclerotic heart disease of native coronary artery without angina pectoris: Secondary | ICD-10-CM

## 2014-03-23 DIAGNOSIS — E782 Mixed hyperlipidemia: Secondary | ICD-10-CM | POA: Diagnosis not present

## 2014-03-23 DIAGNOSIS — R51 Headache: Secondary | ICD-10-CM

## 2014-03-23 NOTE — Patient Instructions (Signed)
Your physician wants you to follow-up in:  6 MONTHS WITH DR NISHAN  You will receive a reminder letter in the mail two months in advance. If you don't receive a letter, please call our office to schedule the follow-up appointment. Your physician recommends that you continue on your current medications as directed. Please refer to the Current Medication list given to you today. 

## 2014-03-23 NOTE — Assessment & Plan Note (Signed)
Cholesterol is at goal.  Continue current dose of statin and diet Rx.  No myalgias or side effects.  F/U  LFT's in 6 months. Lab Results  Component Value Date   LDLCALC 110* 04/06/2012

## 2014-03-23 NOTE — Assessment & Plan Note (Signed)
Well controlled.  Continue current medications and low sodium Dash type diet.    

## 2014-03-23 NOTE — Progress Notes (Signed)
Patient ID: SHARYL PANCHAL, female   DOB: [birth year], 79 y.o.   MRN: [id number] ODESSA NISHI is a 79 y.o. [name] female with a history of CAD, CABG 2011, HTN, HLD, HA, fibromyalgia, IBS, and GERD who presented to [facility] on 01/05/14 with chest pain.   Last ischemic eval was a Lexiscan stress test in 2013 (done for chest pain). She was admitted 10/29-11/02 with HA due to hypertensive emergency (SBP > 220), and hyponatremia (Na + 119). HCTZ was stopped and her sodium improved. She may have been taking an OTC medication with pseudoephedrine.   She was seen in the ER on 11/04 with HTN, SBP > 210. Discharged from the ER.  een by Dr. Lenna Gilford 11/05, BP meds increased with addition of hydralazine 25 mg BID, increased Losartan 50 mg-->100 mg daily.  ED visit 11/09 with HA, BP 212/92, MD suggested she stagger meds for better 24 hour coverage, no med changes made.   She was seen in hospital 11/15  With r/o normal lexiscan myovue and echo with normal EF  BP meds adjusted  Not on diuretic due to hyponatremia She seems more anxious with labile emotions  Sees Lenna Gilford as primary and Rosina Lowenstein neuro for headaches.  Has had them ? A year  When she gets them Makes her BP labile     ROS: Denies fever, malais, weight loss, blurry vision, decreased visual acuity, cough, sputum, SOB, hemoptysis, pleuritic pain, palpitaitons, heartburn, abdominal pain, melena, lower extremity edema, claudication, or rash.  All other systems reviewed and negative  General: Affect appropriate Healthy:  appears stated age 27: normal Neck supple with no adenopathy JVP normal no bruits no thyromegaly Lungs clear with no wheezing and good diaphragmatic motion Heart:  S1/S2 no murmur, no rub, gallop or click PMI normal Abdomen: benighn, BS positve, no tenderness, no AAA no bruit.  No HSM or HJR Distal pulses intact with no bruits No edema Neuro non-focal Skin warm and dry No muscular weakness   Current Outpatient  Prescriptions  Medication Sig Dispense Refill  . aspirin EC 81 MG tablet Take 81 mg by mouth daily.    . cyclobenzaprine (FLEXERIL) 5 MG tablet take 1/2 to 1 tablet by mouth twice a day if needed 60 tablet 5  . fexofenadine (ALLEGRA) 180 MG tablet Take 180 mg by mouth daily.      . fluocinonide (LIDEX) 0.05 % external solution Apply 1 application topically daily as needed (scalp itching).     . Glucosamine-Chondroit-Vit C-Mn (GLUCOSAMINE CHONDR 1500 COMPLX PO) Take 1 tablet by mouth 3 (three) times daily.      . hydrALAZINE (APRESOLINE) 50 MG tablet Take 1 tablet (50 mg total) by mouth 3 (three) times daily. (Patient taking differently: Take 1/2 to 1 tablet by mouth three times daily) 45 tablet 6  . hyoscyamine (ANASPAZ) 0.125 MG TBDP disintergrating tablet Place 0.125 mg under the tongue every 6 (six) hours as needed for bladder spasms or cramping.    [name] ketoconazole (NIZORAL) 2 % shampoo Apply 1 application topically 2 (two) times a week. As needed for itching a couple of times week    . latanoprost (XALATAN) 0.005 % ophthalmic solution Place 1 drop into both eyes at bedtime.     [name] letrozole (FEMARA) 2.5 MG tablet Take 1 tablet (2.5 mg total) by mouth daily. 90 tablet 3  . losartan (COZAAR) 100 MG tablet Take 1/2 tablet by mouth two times daily (Patient taking differently: Take 1/2 tablet by mouth three  times daily) 30 tablet 6  . metoprolol (LOPRESSOR) 50 MG tablet Take 1 tablet (50 mg total) by mouth 3 (three) times daily. 90 tablet 1  . mometasone (NASONEX) 50 MCG/ACT nasal spray Place 2 sprays into the nose daily as needed (congestion).     . nitroGLYCERIN (NITROSTAT) 0.4 MG SL tablet Place 0.4 mg under the tongue every 5 (five) minutes as needed for chest pain.    . Omega-3 Fatty Acids (FISH OIL) 1200 MG CAPS Take 1,200 mg by mouth daily.     [name] omeprazole (PRILOSEC) 40 MG capsule Take 1 capsule (40 mg total) by mouth daily. 30 minutes before 1st meal of the day 30 capsule 6  . tiZANidine  (ZANAFLEX) 2 MG tablet Take 2 mg by mouth at bedtime.    . traMADol (ULTRAM) 50 MG tablet Take 1 tablet (50 mg total) by mouth 3 (three) times daily as needed. 90 tablet 5  . vitamin B-12 (CYANOCOBALAMIN) 1000 MCG tablet Take 1,000 mcg by mouth daily.       No current facility-administered medications for this visit.    Allergies  Choline fenofibrate; Simvastatin; Hctz; Hydrocodone; Levaquin; Adhesive; Ceclor; Clarithromycin; Codeine; Doxycycline; Lisinopril; Penicillins; and Tobramycin-dexamethasone  Electrocardiogram:  SR rate 64  Normal LAE 01/14/14   Assessment and Plan

## 2014-03-23 NOTE — Assessment & Plan Note (Signed)
Stable with no angina and good activity level.  Continue medical Rx CABG x3 2011  Normal myovue 2015

## 2014-03-23 NOTE — Assessment & Plan Note (Signed)
Improved f/u neurology  Tramadol seems to work well for her

## 2014-03-24 ENCOUNTER — Other Ambulatory Visit: Payer: Self-pay | Admitting: *Deleted

## 2014-03-24 MED ORDER — NITROGLYCERIN 0.4 MG SL SUBL: 0.4000 mg | SUBLINGUAL_TABLET | SUBLINGUAL | Status: DC | PRN

## 2014-03-29 ENCOUNTER — Telehealth: Payer: Self-pay | Admitting: *Deleted

## 2014-03-29 NOTE — Telephone Encounter (Signed)
Order for mammogram faxed to [facility] 

## 2014-03-30 DIAGNOSIS — J3089 Other allergic rhinitis: Secondary | ICD-10-CM | POA: Diagnosis not present

## 2014-03-30 DIAGNOSIS — J301 Allergic rhinitis due to pollen: Secondary | ICD-10-CM | POA: Diagnosis not present

## 2014-03-30 DIAGNOSIS — Z853 Personal history of malignant neoplasm of breast: Secondary | ICD-10-CM | POA: Diagnosis not present

## 2014-03-30 DIAGNOSIS — Z803 Family history of malignant neoplasm of breast: Secondary | ICD-10-CM | POA: Diagnosis not present

## 2014-04-05 DIAGNOSIS — M5412 Radiculopathy, cervical region: Secondary | ICD-10-CM | POA: Diagnosis not present

## 2014-04-05 DIAGNOSIS — M9901 Segmental and somatic dysfunction of cervical region: Secondary | ICD-10-CM | POA: Diagnosis not present

## 2014-04-05 DIAGNOSIS — M5032 Other cervical disc degeneration, mid-cervical region: Secondary | ICD-10-CM | POA: Diagnosis not present

## 2014-04-05 DIAGNOSIS — S134XXA Sprain of ligaments of cervical spine, initial encounter: Secondary | ICD-10-CM | POA: Diagnosis not present

## 2014-04-07 DIAGNOSIS — J301 Allergic rhinitis due to pollen: Secondary | ICD-10-CM | POA: Diagnosis not present

## 2014-04-07 DIAGNOSIS — J3089 Other allergic rhinitis: Secondary | ICD-10-CM | POA: Diagnosis not present

## 2014-04-12 DIAGNOSIS — H40013 Open angle with borderline findings, low risk, bilateral: Secondary | ICD-10-CM | POA: Diagnosis not present

## 2014-04-13 ENCOUNTER — Encounter: Payer: Self-pay | Admitting: Pulmonary Disease

## 2014-04-13 ENCOUNTER — Ambulatory Visit (INDEPENDENT_AMBULATORY_CARE_PROVIDER_SITE_OTHER): Payer: Medicare Other | Admitting: Pulmonary Disease

## 2014-04-13 VITALS — BP 136/80 | HR 68 | Temp 96.6°F | Ht 59.0 in | Wt 133.1 lb

## 2014-04-13 DIAGNOSIS — R519 Headache, unspecified: Secondary | ICD-10-CM

## 2014-04-13 DIAGNOSIS — M797 Fibromyalgia: Secondary | ICD-10-CM | POA: Diagnosis not present

## 2014-04-13 DIAGNOSIS — Z23 Encounter for immunization: Secondary | ICD-10-CM | POA: Diagnosis not present

## 2014-04-13 DIAGNOSIS — C50512 Malignant neoplasm of lower-outer quadrant of left female breast: Secondary | ICD-10-CM | POA: Diagnosis not present

## 2014-04-13 DIAGNOSIS — M4302 Spondylolysis, cervical region: Secondary | ICD-10-CM

## 2014-04-13 DIAGNOSIS — I10 Essential (primary) hypertension: Secondary | ICD-10-CM | POA: Diagnosis not present

## 2014-04-13 DIAGNOSIS — K589 Irritable bowel syndrome without diarrhea: Secondary | ICD-10-CM

## 2014-04-13 DIAGNOSIS — K5730 Diverticulosis of large intestine without perforation or abscess without bleeding: Secondary | ICD-10-CM

## 2014-04-13 DIAGNOSIS — I2510 Atherosclerotic heart disease of native coronary artery without angina pectoris: Secondary | ICD-10-CM

## 2014-04-13 DIAGNOSIS — M150 Primary generalized (osteo)arthritis: Secondary | ICD-10-CM

## 2014-04-13 DIAGNOSIS — K219 Gastro-esophageal reflux disease without esophagitis: Secondary | ICD-10-CM

## 2014-04-13 DIAGNOSIS — J301 Allergic rhinitis due to pollen: Secondary | ICD-10-CM | POA: Diagnosis not present

## 2014-04-13 DIAGNOSIS — M8580 Other specified disorders of bone density and structure, unspecified site: Secondary | ICD-10-CM

## 2014-04-13 DIAGNOSIS — R0989 Other specified symptoms and signs involving the circulatory and respiratory systems: Secondary | ICD-10-CM

## 2014-04-13 DIAGNOSIS — E782 Mixed hyperlipidemia: Secondary | ICD-10-CM | POA: Diagnosis not present

## 2014-04-13 DIAGNOSIS — R51 Headache: Secondary | ICD-10-CM | POA: Diagnosis not present

## 2014-04-13 DIAGNOSIS — M159 Polyosteoarthritis, unspecified: Secondary | ICD-10-CM

## 2014-04-13 DIAGNOSIS — J3089 Other allergic rhinitis: Secondary | ICD-10-CM | POA: Diagnosis not present

## 2014-04-13 NOTE — Patient Instructions (Signed)
Today we updated your med list in our EPIC system...    Continue your current medications the same...  Today we gave you the last pneumonia vaccine- PREVNAR-13 (one & done)...  Stay as active as possible...  Call for any questions...  Let's plan a follow up visit in 77mo, sooner if needed for problems.Marland KitchenMarland Kitchen

## 2014-04-13 NOTE — Progress Notes (Signed)
Subjective:    Patient ID: [name], female    DOB: Aug 04, 1935, 79 y.o.   MRN: [id number]  HPI 79 y/o WF here for a follow up visit... she has multiple medical problems including AR & Asthma;  HBP;  CAD followed by [name] & s/p 3 vessel CABG 11/11 by DrGearhardt;  Hyperlipidemia followed in the [facility];  GERD/ IBS/ Divertics;  Breast Cancer diagnosed 12/12;  Fibromyalgia;  Chr HAs & dizziness...  SEE PREV EPIC NOTES FOR OLDER DATA >>   ~  March 31, 2012:  10mo ROV & [name] admits that she's not feeling well today- c/o occipHA w/ band-like extension around her head, feels funny- sl dizzy & light headed, occuring daily; she has Tramadol for prn use but she says she doesn't like taking it- prefers Midrin (refilled), offered Neuro/HA referral & she will consider it...     AR/ Asthma>  She stopped the Symbicort as well & not on any inhaled meds at present; denies cough, sput, ch in dyspnea, etc.    HBP>  controlled on Metop50Bid & Hyzaar100-12.5 w/ BP=144/72 today; denies visual changes, CP, palipit, dizziness, syncope, dyspnea, edema, etc...     CAD>  CP 11/11=> cath w/ in-stent restenosis, then CABG x3 by DrGearhardt; re-hospitalized 6/12 by Cards for CP> cath showed severe 3 vessel CAD w/ 3/3 grafts patent & norm LVF w/ EF= 65-70%; Myoview 5/13 showed no ischemia & EF>80%; no change in meds- on Imdur30; & DrNishan noted that med rx is lim by side effects.    Hyperlipid>  followed in the [facility] reviewed on Cres10- 2d per week but she was unable to tolerate even this low dose; off all meds on diet alone...    GI- GERD, Divertics> on Omep40; followed by DrPatterson & seen 9/13 f/u for ?diverticulitis- improved after Cipro/Flagyl but CTAbd was neg x diverticulosis; he rec high fiber diet & Citrucel...    Hx left breast cancer> she had f/u Oncology 12/13- back on Femara & tol ok; last mammogram 10/13 was ok as well...    Others>  she has persistant FM symptoms and chr HAs, uses Vicodin Prn... We  reviewed prob list, meds, xrays and labs> see below for updates >> she had the 2013 Flu vaccine 10/13...   EKG 9/13 showed NSR, rate64, NSSTTWA, NAD...   CXR 10/13 showed heart at upper lim of norm, prior CABG, mild hyperinflation, clear, NAD...  Abd Ultrasound 10/13 by [name] for eval CP showed> norm GB, liver, kidneys, & Ao- neg sonar...  LABS 10-12/13 showed Chems- wnl;  CBC- wnl  LABS 2/14:  FLP- ok x LDL=110;  TSH=1.33   ~  Jul 03, 2012:  38mo ROV & post hospital visit> [facility] 4/22-23/14 after she presented w/ a pill (B-complex vit) aspiration and wheezing, SOB, asthma exac; she was able to cough it all out & did not require bronchoscopy; she denied dysphagia, n/v, etc;  Still not feeling well, min residual cough, notes some neck pain (XRay showed Cspine spondylosis) & she wants MRI=> referral to NS or whatever is necessary; we reviewed Rx w/ Tramadol, Robaxin as well... Her CC today sounds like reactive hypoglycemia & we reviewed diet adjustment- low glycemic index foods, more freq small meals if nec, etc...     We reviewed prob list, meds, xrays and labs> see below for updates >>   CXR 4/14 showed norm heart size, s/p CABG, tort thor Ao, bilat apical pleural scarring, NAD.Marland KitchenMarland Kitchen  ~  September 29, 2012:  12mo ROV & [name] had her MRI CSpine 5/14> multilevel facet hypertrophy, foraminal narrowing & stenosis, disc osteophyte complexes, some central canal stenosis=> she was sent to NS, DrHirsh who rec Mobic & PT (helped alittle) but she was not pleased and is requesting a Rheumatology 2nd opinion about her neck discomfort; notes that insurance won't pay for musc relaxers...     She saw [name] for Cards 5/14> CAD, s/p CABG 2011, hx in-stent restenosis, unable to tol statins (even low dose intermittent rx); Myoview 5/13 was neg- no ischemia & EF>80%; no change in med rx...    She had f/u DrMagrinat 6/14> his note is reviewed- s/p lumpectomy, sentinel node, XRT; plan is to continue Femara2.5mg  thru 3/16 for  35yrs rx    She had GYN f/u 6/14 w/ [name]> doing satis x some dryness... We reviewed prob list, meds, xrays and labs> see below for updates >>   ~  February 03, 2013:  14mo ROV & [name] had Rheum consult [name]- last seen 9/14 & note reviewed> neck pain, this exac HAs/migraines, dizzy/ light headed; MRI=> NS consult & not a surg cand & NSAIDs/ PT w/ min benefit; saw Neurology- tried Pamelor but feels hung-over; for her DDD & OA they rec Flex5mg Tid which has helped some & she requests refill today- OK...     She saw [name] 9/14 after ER visit for HA> hx migraines and mixed HAs; CT Brain was neg & MRI CSpine showed multilevel DDD & some foraminal stenosis; Labs were neg including CRP & Sed; they tried Nortrip & Mobic but only min benefit...    She had routine f/u [name] 10/14> Hx left breast cancer dx 10/12 & treated w/ lumpectomy, XRT, & now Femara; exam neg, f/u mammogram neg, rec f/u 9yr... We reviewed prob list, meds, xrays and labs> see below for updates >> BP remains well controlled on her 3 meds; she had the 2014 flu vaccine 10/14; she continues on allergy shots at the [facility]...  ~  August 11, 2013:  40mo ROV & [name] reports that she is stable overall just lack energy so she's eating extra protein to help w/ this she tells me... She has had numerous specialty follow up visits in the interval>>    She had an allergy f/u w/ DrVanWinkle 3/15> AR on allergy shots, Allegra, Flonase, Saline; she is stable w/o recent exacerbation...    Hx Asthma on XopenexHFA as needed; she has done well w/o asthma exac, cough, sput, wheezing, or SOB...     She had a Cards f/u w/ drNishan 1/15> HBP, CAD, HL; on ASA81, Metop50Bid, Hyzaar100-12.5, Imdur30; Hx neg Myoview 5/13 & doing satis- no changes made; BP today= 136/68 & she denies CP, palpit, SOB, dizzy, edema, etc...    Lipids controlled on diet alone (w/ FishOil & CoQ10); Last FLP was 2/14 showing TChol 194, TG 143, HDL 55, LDL 110; reminded of diet, exercise, &  need for Fasting blood work...    She has seen DrHawkes for rheum and DrHirsh for NS in the past> told to f/u prn only...    She had a Neuro f/u 1/15> HAs followed by [name]; neg CTBrain & DDD on MRI Cspine; she refused to take Pamelor & uses Tramadol/ Flexeril as needed; no change in meds needed...     She saw Oncology 6/15 for f/u of her left Breast Cancer> on Femara2.5 & tol well, s/p lumpectomy & sentinel node bx 12/12, followed by XRT finished 3/13, & on Letrozole since then,  no known recurrence...  We reviewed prob list, meds, xrays and labs> see below for updates >>   ~  December 21, 2013:  13mo ROV & add-on requested for bronchitic exacerbation> [name] tells me that she noted the onset of cough, chest congestion, hoarse voice 7 min sore throat ~4d ago; she denied f/c/s but has chest soreness from the coughing 7 bringing up sm amt of yellow-green sput; of note she was recently (3wks ago) treated for a UTI (Proteus sens Cipro) & this resolved... Exam shows borderline BP= 160/78, otherw stable VS w/ O2sat95% on RA, sl pharyngeal erythema, no exudates, ears neg, no adenopathy, hoarse voice, chest exam w/ bilat rhonchi & exp wheezing... CXR shows norm heart size, s/pCABG, clear lungs w/o infiltrates... We discussed Rx w/ Depo80, Levaquin500 x7d, Mucinex 600-2Bid, Fluids, MMW & Delsym prn...     We reviewed prob list, meds, xrays and labs> see below for updates >>   CXR 10/15 showed normal heart size, s/p CABG, unchanged biapical pleural scarring, otherw clear lungs w/o infiltrates, NAD...   ~  December 30, 2013:  1wk ROV & post hosp check>  [name] was [facility] 10/29 - 12/27/13 by [facility] w/ HA, HBP, & hyponatremia (Na=119 ?etiology)> she had recently been treated for bronchitic exac w/ Levaquin & Mucinex, she thinks the HBP was a reaction "allergy" to Levaquin & doesn't want to take this antibiotic again; BP was 220 range in ER &treated w/ Cardene drip=> she was actually disch on less meds than PTA=> sdhe returned to  ER the day after disch w/ HBP & BP ~200 & she hadn't taken her Losartan yet, given IV hyralazine & BP improved to the 160s; now on Metop50Bid & Cozaar50 (prev on Hyzaar100-12.5); they suspected her diuretic in her hyponatremia which corrected w/ IVF; she has a hx of HAs from DJD/neck and FM; We reviewed the following medical problems during today's office visit >>     AR/ Asthma>  not on any inhaled meds at present; denies cough, sput, ch in dyspnea, etc; she has Allegra, Nasonex, Mucinex, Tessalon, MMW for her various symptoms...    HBP>  on Metop50Bid & Losar50 now w/ BP=146/82 today; denies visual changes, CP, palipit, dizziness, syncope, dyspnea, edema, etc; we decided to incr Losartan to 100 & add Hydralazine50-1/2Bid w/ ROV 2 wks...    CAD>  CP 11/11=> cath w/ in-stent restenosis, then CABG x3 by DrGearhardt; re-hospitalized 6/12 by Cards for CP> cath showed severe 3 vessel CAD w/ 3/3 grafts patent & norm LVF w/ EF= 65-70%; Myoview 5/13 showed no ischemia & EF>80%; no change in meds- on Imdur30; & DrNishan noted that med rx is lim by side effects.    Hyperlipid>  Intol to statins; followed in the [facility] & FLP on Cres10- 2d per week reviewed but she was unable to tolerate even this low dose; off all meds on diet alone now...    GI- GERD, Divertics> on Omep40 + Levsin0.125 & Align; followed by DrPatterson & seen 9/13 f/u for ?diverticulitis- improved after Cipro/Flagyl but CTAbd was neg x diverticulosis; he rec high fiber diet & Citrucel...    Hx left breast cancer> she had f/u Oncology 12/13- on Femara2.5 & tol ok; last mammogram 10/13 was ok as well...    Others>  she has persistant FM symptoms and chr HAs, uses Tramadol & Advil Prn... We reviewed prob list, meds, xrays and labs> see below for updates >>   CXR 10/15 showed fe chr changes in lungs, norm  heart size & s/p CABG, DJD in spine, NAD.Marland KitchenMarland Kitchen  EKG 11/15 showed NSR, rate69, NSSTTWA, NAD...  Lab cumulative summary sheet reviewed...  PLAN>> we  decided to incr Losartan to 100mg /d and add Hydralazine50-1/2Bid, continue Metop50Bid and the Imdur30 as before; she knows to aqvoid excess sodium in her diet (esp now that hyponatremia resolved & she's off diuretic; ROV 2wks...    ~  January 14, 2014:  2wk ROV & another hospitalization in the interval> [name] has had a continuing saga of HA & HBP, she has been to the ER, to Cardiology, Hospitalized, back to Cardiology, and almost daily phone calls for HA & HBP w/ BP ~200 sys> all notes reviewed;  Cards eval for secondary cause of the HBP has been neg;  Initially it was assumed that incr BP resulted in HBP but she has a hx of chronic daily HAs and she continues to have HAs when her BP is controlled, and it seems more likely that the pain contributes to her HBP;  She was last Hosp 11/11 - 01/06/14 and disch on ASA81, Imdur60, Hydralazine50Tid, Losar100-1/2Bid, Metop50Bid;  BP at disch was 100/47;  She saw [name] 11/16 on these meds w/ BP recorded 130/64, Imdur was stopped due to her HAs, they plan f/u 20mo;  She has called daily since then w/ elev BP (esp at night she says) and we have rec strategies using her current meds to help her headaches and adjust BP meds:    HBP> Rec to adjust meds- take Metop50Tid, Losartan100-1/2Tid, Apresoline50- take 1/2 to 1tab Tid (she wants to do this since BP is low in AM & she feels weak, with BP elev in PM & she needs more)...    Chronic Daily HAs> she needs f/u appt w/ Neurology w/ their active management of this problem; she has Zanaflex2mg  & Cyclobenzaprine5mg - asked to try one of these taken Tid & if not improved then try the other Tid; rest, use heating pad on neck, and take Tramadol/ Tylenol up to 3 times daily as needed... We reviewed prob list, meds, xrays and labs> see below for updates >>   CXR 11/15 showed norm heart size, prev CABG, clear lungs- some hyperinflation/ NAD, DJD Tspine...  Renal Sonar 11/15 was wnl...  CT Head 11/15 showed cortical atrophy and  small vessel dis, sinuses clear, NAD.Marland KitchenMarland Kitchen   EKG 11/15 showed NSR, rate64, borderline tracing w/?LAE & min NSSTTWA...  2DEcho 11/15 showed norm cavity size & thickness, norm EF= 60-65%, norm wall motion, norm diastolic function, essentially norm valves...   Myoview 11/15 showed low risk scan w/ abn EKG portion but no stress induced perfusion defects, hyperdynamic LVF w/ EF>70% & no regional wall motion abn  LABS 11/15:  Chems- wnl w/ Cr=0.6;  CBC- ok w/ Hg= 12.3;  TSH=1.60 PLAN>>  take Metop50Tid, Losartan100-1/2Tid, Apresoline50- take 1/2 to 1tab Tid (she wants to do this since BP is low in AM & she feels weak, with BP elev in PM & she needs more); needs f/u appt w/ Neurology w/ their active management of this problem; she has Zanaflex2mg  & Cyclobenzaprine5mg - asked to try one of these taken Tid & if not improved then try the other Tid; rest, use heating pad on neck, and take Tramadol/ Tylenol up to 3 times daily as needed...  ~  February 04, 2014:  3wk ROV & [name] reports improved overall;  BP is better regulated but still sl volatile & she like the ability to adjust the Hydralazine> on Metop50Tid, Cozaar100- 1/2  tid, & Hydralazine50- 1/2to1Tid;  BP today= 144/78 and when it was up>160/90 last PM she took an extra 25mg  dose...  She saw Neuro [name] for her chronic daily HAs 11/15 and she agreed w/ our Rx for BP control + Muscle relaxer (she prefers the Zanaflex over the Flexeril) + Tramadol/ Tylenol for pain...  She is c/o a jitteriness/ shaking on the inside feeling and we discussed trying a regular medication to try to help elim this sensation> try KLONOPIN 0.5mg - 1/2 to 1 tab Bid... She will f/u in 36mo & call sooner prn problems...   ~  April 13, 2014:  87mo ROV & [name] has had numerous medical follow up visits over the last 68mo>    She saw GI 12/15 w/ lower abd pain & nausea similar to prev bouts of diverticulitis in past;  CT Abd&Pelvis showed mild sigmoid diverticulitis, no evid of abscess or  complications;  She was treated w/ Cipro & Flagyl & improved;  She is maintained on Prilosec, Anaspaz, fiber & metamucil...     She saw Neuro, [name] 1/16 due to headaches> long hx migraines, recurrent prob since 2014, now 15/30 days per month, CT Brain was neg x atrophy & sm vessel dis, MRI Cspine w/ multilevel DDD etc; treated w/ Tramadol, Flexeril, Zanaflex and [name] has ordered a Sleep Study- still pending & sched at end of this month...     She also saw DrNishan for [facility] f/u 1/16> hx CAD, CABG 2011, HBP, HLD; Adm 10/15 w/ HBP & hyponatremia (Na=119), HCTZ was stopped, BP meds adjusted & now controlled on Metoprolol50Tid, Losar100-1/2Tid, Apres50-1/2Tid; BP today= 136/80, improved & she denies recent CP, palpit, dizzy, ch in SOB, edema;  2DEcho 11/15 showed norm LVF w/ EF=60-65% & no regional wall motion abn, trivial AI, otherw wnl; Lexiscan Myoview 11/15 showed low risk scan w/ abn EKG portion but no ischemic perfusion defects & hyperdynamic LV w/ EF=70% & no wall motion abn... We reviewed prob list, meds, xrays and labs> see below for updates >> we gave her the PREVNAR-13 today... She had the Shingles vaccination at [facility]... PLAN>> she will continue her same meds and plan f/u in ~55mo w/ fasting blood work at that time...            Problem List:  GLAUCOMA (ICD-365.9) - she is sched for right cataract surg by DrBevis in Feb...  ALLERGIC RHINITIS (ICD-477.9) - she uses Allegra & Nasonex Prn; on Allergy shots weekly x yrs... ~  She is followed by DrVanWinkle at the [facility]=> she remains on allergy shots...  ASTHMA & Asthmatic Bronchitis >> off Symbicort & using XOPENEX HFA as needed rescue inhaler... ~  5/11:  notes incr chest symptoms in the heat & rec to take the Symbicort 2sp Bid... ~  2012:  After her CABG & repeat hosp by Cards she is noted to be off her inhalers... ~  10/12:  Treated w/ Zpak, Pred, Mucinex, plus her Symbicort80 & improved... ~   CXR 5/13 showed s/p CABG, clear lungs, NAD (she went to the Er w/ cough & had neg eval). ~  7/13:  DrVanWinkle stopped her Symbicort & switched to [facility] HFA as needed... ~  2/14: she does not list any inhaled meds at present & states breathing OK w/o exac... ~  4/14: she was hosp after pill asp w/ asthma exac & improved w/ supportive Rx, didn't require bronch, CXR was clear, grad improved post hosp... ~  CXR 4/14 showed normal heart size, s/p CABG, sl tort Ao, bilat apic pleural scarring, NAD...  ~  6/15: she is stable w/o asthma exac and has avoided URIs/ bronchitis/ etc... ~  10/15: presented w/ URI/ bronchitic exac w/ cough, yellow-green sput, congestion, hoarseness, etc; CXR w/o signs of pneumonia & treated w/ Depo, Levaquin, Mucinex, MMW, Delsym, etc...  ~  CXR 10/15 showed few chr changes in lungs, norm heart size & s/p CABG, DJD in spine, NAD  HYPERTENSION (ICD-401.9) - controlled on TOPROL 50mg Bid & HYZAAR 100-12.5 daily (off Lisinopril due to ACE cough)...  ~  8/12: BP=  128/84 & tol meds well; denies HA, fatigue, visual changes, CP, palipit, syncope, dyspnea, edema, etc... ~  2/13: BP= 140/64 & she remains essentially asymptomatic on Rx... ~  8/13:  BP= 132/64 & she denies CP, palpit, SOB, edema, etc... ~  2/14: controlled on Toprol & Hyzaar w/ BP 144/72 today; denies visual changes, CP, palipit, dizziness, syncope, dyspnea, edema, etc... ~  5/14:  BP= 136/60 & she remains stable... ~  8/14:  BP= 134/60 and she denies CP, palpit, dizzy, SOB, edema... ~  12/14: on Metop50Bid, Hyzaar 100-12.5, Imdur30;  BP= 140/80 & she remains largely asymptomatic... ~  6/15: on ASA81, Metop50Bid, Hyzaar100-12.5, Imdur30; BP today= 136/68 & she denies CP, palpit, SOB, dizzy, edema, etc... ~  10/15: on same meds w/ BP= 160/78 & no changes meds- advised to monitor BP at home once she's over the URI illness, take meds regualrly... ~  11/15: post Hosp for HBP, hyponatremia; they stopped diuretic & disch on  Metop50Bid & Losar50; she went back to ER the next day w/ BP~200 (hadn't taken her Losartan) & given IV Hydralazine w/ improvement to 160s; we decided to continue the BBlocker, increase the ARB to 100mg /d, and ADD HYDRALAZINE50- 1/2Bid for now; ROV 2 weeks... ~  11/15: she is s/p ER, Hosp, Cards visits & mult phone calls (see above)> meds ajusted- take Metop50Tid, Losartan100-1/2Tid, Apresoline50- 1/2 to 1tab Tid (she wants to do this since BP is low in AM & she feels weak, with BP elev in PM & she needs more)... ~  2/16: BP meds adjusted over the last few months & BP controlled on Metoprolol50Tid, Losar100-1/2Tid, Apres50-1/2Tid; BP today= 136/80, improved & she denies recent CP, palpit, dizzy, ch in SOB, edema...  CORONARY ARTERY DISEASE (ICD-414.00) - on ASA 81mg /d (intol to 325 she says) & off Plavix per [name]...  Cardiolite 9/08 showed CP & HBP response, EF+85%... had cath 10/08 w/ single vessel dis w/ mod ostial LAD stenosis and patent LAD stent, normal LVF.[name]. option for med Rx vs off pump LIMA... she notes some CP/ pressure "burning" when walking up a certain hill during her daily walks, but states this is stable and "no worse"... ~  Myoview 9/09 was neg- no scar or ischemia, EF= 89%, hypertensive response- Lisinopril10 added. ~  f/u Myoview 3/11 was neg- no scar or ischemia, EF= 86%, norm BP response, fair exerc capacity. ~  saw [name] 5/11- no CP, he stopped her Plavix, stable- continue other meds same. ~  She had recurrent CP 11/11 w/ in-stent restenosis on cath> subseq 3 vessel CABG by DrGearhart & doing well since then... ~  6/12:  Hosp w/ CP & repeat cath showed CAD & 3/3 grafts patent, EF= 65-70% ~  Myoview 5/13 was felt to be a normal stress nuclear study- normal wall motion, no ischemia, EF=84% ~  2/14: CP 11/11=> cath w/ in-stent restenosis, then  CABG x3 by DrGearhardt; re-hospitalized 6/12 by Cards for CP> cath showed severe 3 vessel CAD w/ 3/3 grafts patent & norm LVF w/ EF=  65-70%; Myoview 5/13 showed no ischemia & EF>80%; no change in meds; & [name] noted that med rx is limited by side effects... ~  8/14 & 12/14: she remains stable on Imdur30 + Metop50Bid 7 Hyzaar100-12.5; denies CP, palpit, SOB, etc... ~  6/15: Cards f/u w/ DrNishan 1/15> HBP, CAD, HL; on ASA81, Metop50Bid, Hyzaar100-12.5, Imdur30; Hx neg Myoview 5/13 & doing satis- no changes made; BP today= 136/68 & she denies CP, palpit, SOB, dizzy, edema, etc ~  EKG 11/15 showed NSR, rate69, NSSTTWA, NAD  ~  2DEcho 11/15 showed  norm LVF w/ EF=60-65% & no regional wall motion abn, trivial AI, otherw wnl. ~  Myoview 11/15 showed low risk scan w/ abn EKG portion but no stress induced perfusion defects, hyperdynamic LVF w/ EF>70% & no regional wall motion abn. ~  11/15: she is off the Imdur per DrNishan due to HAs... ~  1/16: she had f/u DrNishan> stable on meds and no changes made...  HYPERLIPIDEMIA (ICD-272.4) - on CRESTOR 10mg - 1/2 on M&F, FISH OIL 1200mg  Tid + Flax Seed Oil... she has been intol to statins in the past>  tried low dose Trilipix but stopped this due to "thigh burning">  now followed in the Lipid Clinic--- ~  [place] 7/08 showed TChol 217, TG 236, HDL 46, LDL 150...  ~  [place] 6/09 showed TChol 233, TG 263, HDL 46, LDL 150... she agrees to try LOW DOSE Trilipix. ~  FLP 11/09 showed TChol 162, TG 167, HDL 45, LDL 83... ~  [place] 11/10 showed TChol 240, TG 271, HDL 45, LDL 150... LipClin Rx w/ FishOil & FlaxSeedOil. ~  FLP 2/11 showed TChol 285, TG 156, HDL 79, LDL 180... LC is aware & working w/ her regularly. ~  [place] 5/11 showed TChol 277, TG 241, HDL55, LDL 193...  LC added Cres10 just 2d per week. ~  FLP 10/11 showed TChol 198, TG 215, HDL 53, LDL 109... much improved on Cres10 twice weekly. ~  She continues to f/u w/ Lipid clinic & they titrated up the Cres10 on M&F, plus 5mg  on Wednes> ~  FLP  10/12 was the best yet w/ TChol 167, TG 203, HDL 54, LDL 88...  ~  [place] 4/13 showed TChol 193, TG 176, HDL 57,  LDL 101 ~  She is still followed in the [facility]- note 6/13 reviewed (at that time on Cres10 on M&F, 5mg  on W) but she subseq cut herself down to 1/2 on M&F due to nausea & aching. ~  FLP 2/14 on diet alone showed TChol 194, TG 143, HDL 55, LDL 110... We reviewed diet, exercise, etc... ~  She remains off the [place] on diet alone and she declines to restart low dose rx "I can't tolerate them"  GERD (ICD-530.81) - on OMEPRAZOLE 40mg /d... last EGD by DrPatterson 8/07 revealed sm HH, & gastritis and dilatation done... (RUT=neg, PPI Rx)... LEVSIN 0.125mg  helps her esoph spasm... Prn Phenergan for nausea.  DIVERTICULOSIS, COLON (ICD-562.10) & IRRITABLE BOWEL SYNDROME (ICD-564.1) ~  colonoscopy 2/03 by DrPatterson showed divertics and 5 mm polyp... ~  colonoscopy 3/11 showed divertics & cecal polyp= tubular adenoma, w/ f/u planned 71yrs. ~  9/13: seen by DrPatterson f/u for ?diverticulitis- improved after Cipro/Flagyl but CTAbd was neg x diverticulosis; he rec high fiber diet & Citrucel...  ? GALLSTONES>>  Diagnosed by DrPatterson and referred  to [facility] DrWilson, pt asked to call prn fopr incr symptoms to consider surg... ~  Abd Ultrasound 10/13 by [name] for eval CP showed> norm GB, liver, kidneys, & Ao- neg sonar.  BREAST CANCER >> Abn mammogram 10/12 showing a left breast nodule & subseq surg (partial mastectomy & sentinel node bx) by [name] 12/12 that proved to be an invasive ductal carcinoma, 2.3cm size, w/ neg lymphovasc invasion, clear margins (but close at 55mm), & neg nodes; she had XRT by DrKindard (had her 11th treatment today); and Oncology eval by DrMagrinat who plans hormone Rx later (no chemo she says)...  ~  2/13: She is tolerating treatment well & spirits are up, [name] had to aspirate a seroma & it is now resolved... ~  She is followed by DrMagrinat, Streck, Kinard- now on [facility] 2.5mg /d w/ 54yrs planned... ~  6/14:  She had f/u DrMagrinat 6/14> his note is reviewed- s/p lumpectomy, sentinel  node, XRT; plan is to continue Femara2.5mg  thru 3/16 for 12yrs rx. ~  10/14:  She had routine f/u [name] 10/14> Hx left breast cancer dx 10/12 & treated w/ lumpectomy, XRT, & now Femara; exam neg, f/u mammogram neg, rec f/u 50yr. ~  6/15:  She saw Oncology for f/u of her left Breast Cancer> on Femara2.5 & tol well, s/p lumpectomy & sentinel node bx 12/12, followed by XRT finished 3/13, & on Letrozole since then, no known recurrence...   DJD, Neck Pain >> ~  XRays Cspine 4/14 in hosp showed multilevel cerv spondylosis, 1-25mm anterolisthesis C4 on C5, DDD, foraminal stenosis at C5-6...  ~  5/14:  MRI Cspine => multilevel facet hypertrophy, foraminal narrowing & stenosis, disc osteophyte complexes, some central canal stenosis=> she was sent to NS, DrHirsh who rec Mobic & PT (helped alittle) but she was not pleased... ~  8/14:  She is requesting a Rheumatology 2nd opinion about her neck discomfort=> seen by [facility]- neck pain, this exac HAs/migraines, dizzy/ light headed; MRI=> NS consult & not a surg cand & NSAIDs/ PT w/ min benefit; saw Neurology- tried Pamelor but feels hung-over; for her DDD & OA they rec Flex5mg Tid which has helped some...  FIBROMYALGIA (ICD-729.1) - she c/o chr fatigue, aching/ sore, on Tramadol & Tylenol, but most benefit from low dose Hydrocodone ~1/2 tab Prn... I have recommended an increase exercise program to her... ~  5/11:  Vit D level = 50 on 50000 u weekly by Helene Shoe, NP  HEADACHE (ICD-784.0) - eval at [facility] clinic in 2002, but she notes Rx didn't help... ~  3/11:  presented w/ muscle contraction HA's> Rx Tramadol 50mg  Q6H Prn. ~  She has persistent/ recurrent HAs... ~  11/15: her chronic daily HAs have again risen in improtance due to interaction w/ her HBP> asked to f/u w/ Neuro ASAP & Rx w/ rest, heating pad, Tramadol50/ Tylenol, and musc relaxer- Zanaflex2Tid vs Flexeril5Tid...  DIZZINESS, CHRONIC (ICD-780.4) - MRI 2/09 per [name] showed atrophy, sm  vessel dis, NAD...  Health Maintenance: ~  GI:  DrPatterson & up to date on colon screening. ~  GYN:  yearly f/u w/ Helene Shoe, NP for PAP, Mammogram at [facility], ?last BMD. ~  Immunizations:  yearly Flu vaccines in fall, Pneumovax in 2007?, TDAP given 5/11...   Past Surgical History  Procedure Laterality Date  . Coronary angioplasty with stent placement      Stent 2007  . Open heart surgery      01/19/2010  . Coronary artery bypass graft    .  Cataract extraction, bilateral      bilateral caaract removal,  . Breast lumpectomy Left 02/06/11    Outpatient Encounter Prescriptions as of 04/13/2014  Medication Sig  . aspirin EC 81 MG tablet Take 81 mg by mouth daily.  . cyclobenzaprine (FLEXERIL) 5 MG tablet take 1/2 to 1 tablet by mouth twice a day if needed  . fexofenadine (ALLEGRA) 180 MG tablet Take 180 mg by mouth daily.    . fluocinonide (LIDEX) 0.05 % external solution Apply 1 application topically daily as needed (scalp itching).   . Glucosamine-Chondroit-Vit C-Mn (GLUCOSAMINE CHONDR 1500 COMPLX PO) Take 1 tablet by mouth 3 (three) times daily.    . hydrALAZINE (APRESOLINE) 50 MG tablet Take 1 tablet (50 mg total) by mouth 3 (three) times daily. (Patient taking differently: Take 1/2 to 1 tablet by mouth three times daily)  . hyoscyamine (ANASPAZ) 0.125 MG TBDP disintergrating tablet Place 0.125 mg under the tongue every 6 (six) hours as needed for bladder spasms or cramping.  [name] ketoconazole (NIZORAL) 2 % shampoo Apply 1 application topically 2 (two) times a week. As needed for itching a couple of times week  . latanoprost (XALATAN) 0.005 % ophthalmic solution Place 1 drop into both eyes at bedtime.   [name] letrozole (FEMARA) 2.5 MG tablet Take 1 tablet (2.5 mg total) by mouth daily.  [name] losartan (COZAAR) 100 MG tablet Take 1/2 tablet by mouth two times daily (Patient taking differently: Take 1/2 tablet by mouth three  times daily)  . metoprolol (LOPRESSOR) 50 MG tablet Take 1 tablet  (50 mg total) by mouth 3 (three) times daily.  . mometasone (NASONEX) 50 MCG/ACT nasal spray Place 2 sprays into the nose daily as needed (congestion).   . nitroGLYCERIN (NITROSTAT) 0.4 MG SL tablet Place 1 tablet (0.4 mg total) under the tongue every 5 (five) minutes as needed for chest pain.  . Omega-3 Fatty Acids (FISH OIL) 1200 MG CAPS Take 1,200 mg by mouth daily.   [name] omeprazole (PRILOSEC) 40 MG capsule Take 1 capsule (40 mg total) by mouth daily. 30 minutes before 1st meal of the day  . tiZANidine (ZANAFLEX) 2 MG tablet Take 2 mg by mouth at bedtime.  . traMADol (ULTRAM) 50 MG tablet Take 1 tablet (50 mg total) by mouth 3 (three) times daily as needed.  . vitamin B-12 (CYANOCOBALAMIN) 1000 MCG tablet Take 1,000 mcg by mouth daily.      Allergies  Allergen Reactions  . Choline Fenofibrate Other (See Comments)     pt states INTOL to Trilipix w/ "thigh burning"  . Simvastatin Other (See Comments)     pt states INTOL to STATINS \\T \ refuses to restart  . Hctz [Hydrochlorothiazide]     Causes hyponatremia  . Hydrocodone     Nightmare after taking cough syrup w/hydrocodone  . Levaquin [Levofloxacin In D5w]     Elevated BP  . Adhesive [Tape] Rash  . Ceclor [Cefaclor] Rash  . Clarithromycin Rash  . Codeine Nausea Only  . Doxycycline Rash  . Lisinopril Cough    Developed ACE cough...  . Penicillins Itching and Rash    At injection site  . Tobramycin-Dexamethasone Rash    Current Medications, Allergies, Past Medical History, Past Surgical History, Family History, and Social History were reviewed in [employer] record.    Review of Systems    See HPI - all other systems neg except as noted...  The patient complains of chest pain and headaches.  The patient denies  anorexia, fever, weight loss, weight gain, vision loss, decreased hearing, hoarseness, syncope, dyspnea on exertion, peripheral edema, prolonged cough, hemoptysis, abdominal pain, melena,  hematochezia, severe indigestion/heartburn, hematuria, incontinence, muscle weakness, suspicious skin lesions, transient blindness, difficulty walking, depression, unusual weight change, abnormal bleeding, enlarged lymph nodes, and angioedema.   Objective:   Physical Exam    WD, WN, 79 y/o WF in NAD... GENERAL:  Alert & oriented; pleasant & cooperative... HEENT:  [place]/AT, EOM-wnl, PERRLA, EACs-clear, TMs-wnl, NOSE-clear, THROAT- clear & wnl. NECK:  Decr ROM; no JVD; normal carotid impulses w/o bruits; no thyromegaly or nodules palpated; no lymphadenopathy. CHEST:  Clear- no wheezing, rales, or signs of consolidation... HEART:  Regular Rhythm; without murmurs/ rubs/ or gallops detected... ABDOMEN:  Soft & nontender; normal bowel sounds; no organomegaly or masses palpated... EXT: without deformities, mild arthritic changes and +trigger points, no varicose veins/ venous insuffic/ or edema. NEURO:  CN's intact; motor testing normal; sensory testing normal; gait normal & balance OK. DERM:  No lesions noted; no rash etc...  RADIOLOGY DATA:  Reviewed in the EPIC EMR & discussed w/ the patient...  LABORATORY DATA:  Reviewed in the EPIC EMR & discussed w/ the patient...   Assessment & Plan:    URI/ Bronchitic exac 10/15>>  Prev treated w/ Depo, Levaquin, Mucinex, MMW, Delsym and improved... Asthma/ AR>  Stable on allergy shots & XOPENEX as needed; no recent exac... Hx pill asp w/ asthma exac=> resolved...  HBP>  BP elev recently & she thought due to Levaquin, refuses that Ab in the future; meds adjusted several times- Hosp, ER, Cards & now rec Metop50Tid, Losar100-1/2Tid, Hydralazine50-1/2 to 1tab Tid...  CAD>  Cath w/ patent grafts; had neg Nuclear study; continue same meds and f/u DrNishan...  CHOL>  Followed in the [facility] & intol to all meds- FLP looks better however on diet alone...  GI> GERD, Divertics, ?Gallstones>  Followed by [facility] & stones eval by DrWilson [facility]; odd that  f/u sonar 9/13 did not show stones...  BREAST CANCER>  on FEMARA > treated by [name], DrMagrinat, DrKinard & records reviewed...  FM/ HAs/ etc>  On Ultram, Tylenol, etc => needs active management of this prob by Neuro... NECK PAIN> with multilevel spondylosis & eval by NS, DrHirsh but she was not pleased & is asking for Rheum eval...  Other medical issues as noted> on CoQ10, Glucosamine, Vit B12, Vit D, etc...   Patient's Medications  New Prescriptions   No medications on file  Previous Medications   ASPIRIN EC 81 MG TABLET    Take 81 mg by mouth daily.   CYCLOBENZAPRINE (FLEXERIL) 5 MG TABLET    take 1/2 to 1 tablet by mouth twice a day if needed   FEXOFENADINE (ALLEGRA) 180 MG TABLET    Take 180 mg by mouth daily.     FLUOCINONIDE (LIDEX) 0.05 % EXTERNAL SOLUTION    Apply 1 application topically daily as needed (scalp itching).    GLUCOSAMINE-CHONDROIT-VIT C-MN (GLUCOSAMINE CHONDR 1500 COMPLX PO)    Take 1 tablet by mouth 3 (three) times daily.     HYDRALAZINE (APRESOLINE) 50 MG TABLET    Take 1/2 tablet by mouth three times daily   HYOSCYAMINE (ANASPAZ) 0.125 MG TBDP DISINTERGRATING TABLET    Place 0.125 mg under the tongue every 6 (six) hours as needed for bladder spasms or cramping.   KETOCONAZOLE (NIZORAL) 2 % SHAMPOO    Apply 1 application topically 2 (two) times a week. As needed for itching a couple  of times week   LATANOPROST (XALATAN) 0.005 % OPHTHALMIC SOLUTION    Place 1 drop into both eyes at bedtime.    LETROZOLE (FEMARA) 2.5 MG TABLET    Take 1 tablet (2.5 mg total) by mouth daily.   LOSARTAN (COZAAR) 50 MG TABLET    Take 1/2 tablet by mouth three times daily   METOPROLOL (LOPRESSOR) 50 MG TABLET    Take 1 tablet (50 mg total) by mouth 3 (three) times daily.   MOMETASONE (NASONEX) 50 MCG/ACT NASAL SPRAY    Place 2 sprays into the nose daily as needed (congestion).    NITROGLYCERIN (NITROSTAT) 0.4 MG SL TABLET    Place 1 tablet (0.4 mg total) under the tongue every 5  (five) minutes as needed for chest pain.   OMEGA-3 FATTY ACIDS (FISH OIL) 1200 MG CAPS    Take 1,200 mg by mouth daily.    OMEPRAZOLE (PRILOSEC) 40 MG CAPSULE    Take 1 capsule (40 mg total) by mouth daily. 30 minutes before 1st meal of the day   TIZANIDINE (ZANAFLEX) 2 MG TABLET    Take 2 mg by mouth at bedtime.   TRAMADOL (ULTRAM) 50 MG TABLET    Take 1 tablet (50 mg total) by mouth 3 (three) times daily as needed.   VITAMIN B-12 (CYANOCOBALAMIN) 1000 MCG TABLET    Take 1,000 mcg by mouth daily.    Modified Medications   No medications on file  Discontinued Medications   HYDRALAZINE (APRESOLINE) 50 MG TABLET    Take 1 tablet (50 mg total) by mouth 3 (three) times daily.   LOSARTAN (COZAAR) 100 MG TABLET    Take 1/2 tablet by mouth two times daily

## 2014-04-14 DIAGNOSIS — M5032 Other cervical disc degeneration, mid-cervical region: Secondary | ICD-10-CM | POA: Diagnosis not present

## 2014-04-14 DIAGNOSIS — M9901 Segmental and somatic dysfunction of cervical region: Secondary | ICD-10-CM | POA: Diagnosis not present

## 2014-04-14 DIAGNOSIS — M436 Torticollis: Secondary | ICD-10-CM | POA: Diagnosis not present

## 2014-04-19 DIAGNOSIS — J301 Allergic rhinitis due to pollen: Secondary | ICD-10-CM | POA: Diagnosis not present

## 2014-04-19 DIAGNOSIS — J3089 Other allergic rhinitis: Secondary | ICD-10-CM | POA: Diagnosis not present

## 2014-04-20 DIAGNOSIS — M9901 Segmental and somatic dysfunction of cervical region: Secondary | ICD-10-CM | POA: Diagnosis not present

## 2014-04-20 DIAGNOSIS — S13120A Subluxation of C1/C2 cervical vertebrae, initial encounter: Secondary | ICD-10-CM | POA: Diagnosis not present

## 2014-04-20 DIAGNOSIS — G43611 Persistent migraine aura with cerebral infarction, intractable, with status migrainosus: Secondary | ICD-10-CM | POA: Diagnosis not present

## 2014-04-20 DIAGNOSIS — M5412 Radiculopathy, cervical region: Secondary | ICD-10-CM | POA: Diagnosis not present

## 2014-04-25 ENCOUNTER — Encounter: Payer: Self-pay | Admitting: Gastroenterology

## 2014-04-25 ENCOUNTER — Ambulatory Visit (INDEPENDENT_AMBULATORY_CARE_PROVIDER_SITE_OTHER): Payer: Medicare Other | Admitting: Neurology

## 2014-04-25 VITALS — BP 182/90 | HR 69

## 2014-04-25 DIAGNOSIS — G4761 Periodic limb movement disorder: Secondary | ICD-10-CM

## 2014-04-25 DIAGNOSIS — G4733 Obstructive sleep apnea (adult) (pediatric): Secondary | ICD-10-CM | POA: Diagnosis not present

## 2014-04-25 DIAGNOSIS — G479 Sleep disorder, unspecified: Secondary | ICD-10-CM

## 2014-04-26 NOTE — Sleep Study (Signed)
Please see the scanned sleep study interpretation located in the Procedure tab within the Chart Review section. 

## 2014-04-28 DIAGNOSIS — K219 Gastro-esophageal reflux disease without esophagitis: Secondary | ICD-10-CM | POA: Diagnosis not present

## 2014-04-28 DIAGNOSIS — J3089 Other allergic rhinitis: Secondary | ICD-10-CM | POA: Diagnosis not present

## 2014-04-28 DIAGNOSIS — J4520 Mild intermittent asthma, uncomplicated: Secondary | ICD-10-CM | POA: Diagnosis not present

## 2014-04-28 DIAGNOSIS — J301 Allergic rhinitis due to pollen: Secondary | ICD-10-CM | POA: Diagnosis not present

## 2014-05-02 ENCOUNTER — Telehealth: Payer: Self-pay | Admitting: Pulmonary Disease

## 2014-05-02 ENCOUNTER — Telehealth: Payer: Self-pay | Admitting: Neurology

## 2014-05-02 NOTE — Telephone Encounter (Addendum)
Patient is calling and needs to speak with you about bad headache and high blood pressure . Patient thought is might be having a reaction to Cyclobenzane and Hydralazine because she took these Rx too close together.  Please advise.

## 2014-05-02 NOTE — Telephone Encounter (Addendum)
Spoke to patient - she has been having extremely high blood pressure readings all weekend (highest systolic being in the 094 range and highest diastolic being 076) - she spoke to her cardiologist on Saturday and was able to get her pressure down by following his verbal instructions.  However, her pressure is starting to climb back up today.  I instructed her to call cardiology back to let them know she needs to be seen in the office for an evaluation.  She has tried Excedrin Arthritis for her headache and is now going to see if taking one Tramadol 50mg  will help.  Agree with BP control.

## 2014-05-02 NOTE — Telephone Encounter (Signed)
Spoke with pt, c/o chest tightness, headache, fluxuating BP since Saturday.  Tested BP at home on Saturday, was 234/115. Yesterday, BP was 90's/50's Today- 152/70.   Pt called neurologist today about the headaches, requested that pt be seen for fluxuating bp.  Scheduled pt at 10:00 with SN tomorrow.  Nothing further needed.

## 2014-05-03 ENCOUNTER — Encounter: Payer: Self-pay | Admitting: Pulmonary Disease

## 2014-05-03 ENCOUNTER — Ambulatory Visit (INDEPENDENT_AMBULATORY_CARE_PROVIDER_SITE_OTHER): Payer: Medicare Other | Admitting: Pulmonary Disease

## 2014-05-03 VITALS — BP 160/70 | HR 62 | Temp 96.8°F | Ht 59.0 in | Wt 134.2 lb

## 2014-05-03 DIAGNOSIS — I10 Essential (primary) hypertension: Secondary | ICD-10-CM

## 2014-05-03 DIAGNOSIS — R519 Headache, unspecified: Secondary | ICD-10-CM

## 2014-05-03 DIAGNOSIS — F411 Generalized anxiety disorder: Secondary | ICD-10-CM

## 2014-05-03 DIAGNOSIS — R51 Headache: Secondary | ICD-10-CM | POA: Diagnosis not present

## 2014-05-03 DIAGNOSIS — I2510 Atherosclerotic heart disease of native coronary artery without angina pectoris: Secondary | ICD-10-CM

## 2014-05-03 DIAGNOSIS — C50512 Malignant neoplasm of lower-outer quadrant of left female breast: Secondary | ICD-10-CM | POA: Diagnosis not present

## 2014-05-03 DIAGNOSIS — R0989 Other specified symptoms and signs involving the circulatory and respiratory systems: Secondary | ICD-10-CM

## 2014-05-03 NOTE — Patient Instructions (Signed)
Today we updated your med list in our EPIC system...    Continue your current medications the same...  It is our opinion that your severe headaches (pain) CAUSE your BP to be elevated beyond it's usual level>> In this regard, we want you to have a plan for managing your blood pressure:    If you don't have a headache & your BP is OK- take your regular dose of meds: Metoprolol50-3times/d, Hydralazine50-1/2tab 3times/d, Losartan50- 1/2tab 3times/d...    If you have a headache> please follow [name]'s (Neurology) treatment plan & take your TRAMADOL, ZANAFLEX, FLEXERIL, and any other meds she advises for severe HAs...       Plan to check your BP 3 times daily when you have the HA pain> if your BP is elevated then plan to take 1 each of your Metop/ Hydral/ Losar as a booster dose (until the HA resolves and your BP returns to baseline)...  Call for any questions.Marland KitchenMarland Kitchen

## 2014-05-03 NOTE — Progress Notes (Signed)
Subjective:    Patient ID: [name], female    DOB: [birth year], 79 y.o.   MRN: [id number]  HPI 79 y/o WF here for a follow up visit... she has multiple medical problems including AR & Asthma;  HBP;  CAD followed by [name] & s/p 3 vessel CABG 11/11 by [name];  Hyperlipidemia followed in the [facility];  GERD/ IBS/ Divertics;  Breast Cancer diagnosed 12/12;  Fibromyalgia;  Chr HAs & dizziness...  SEE PREV EPIC NOTES FOR OLDER DATA >>   ~  March 31, 2012:  22mo ROV & [name] admits that she's not feeling well today- c/o occipHA w/ band-like extension around her head, feels funny- sl dizzy & light headed, occuring daily; she has Tramadol for prn use but she says she doesn't like taking it- prefers Midrin (refilled), offered Neuro/HA referral & she will consider it...     AR/ Asthma>  She stopped the Symbicort as well & not on any inhaled meds at present; denies cough, sput, ch in dyspnea, etc.    HBP>  controlled on Metop50Bid & Hyzaar100-12.5 w/ BP=144/72 today; denies visual changes, CP, palipit, dizziness, syncope, dyspnea, edema, etc...     CAD>  CP 11/11=> cath w/ in-stent restenosis, then CABG x3 by [name]; re-hospitalized 6/12 by Cards for CP> cath showed severe 3 vessel CAD w/ 3/3 grafts patent & norm LVF w/ EF= 65-70%; Myoview 5/13 showed no ischemia & EF>80%; no change in meds- on Imdur30; & [name] noted that med rx is lim by side effects.    Hyperlipid>  followed in the [place] reviewed on Cres10- 2d per week but she was unable to tolerate even this low dose; off all meds on diet alone...    GI- GERD, Divertics> on Omep40; followed by [name] & seen 9/13 f/u for ?diverticulitis- improved after Cipro/Flagyl but CTAbd was neg x diverticulosis; he rec high fiber diet & Citrucel...    Hx left breast cancer> she had f/u Oncology 12/13- back on Femara & tol ok; last mammogram 10/13 was ok as well...    Others>  she has persistant FM symptoms and chr HAs, uses Vicodin Prn... We  reviewed prob list, meds, xrays and labs> see below for updates >> she had the 2013 Flu vaccine 10/13...   EKG 9/13 showed NSR, rate64, NSSTTWA, NAD...   CXR 10/13 showed heart at upper lim of norm, prior CABG, mild hyperinflation, clear, NAD...  Abd Ultrasound 10/13 by [name] for eval CP showed> norm GB, liver, kidneys, & Ao- neg sonar...  LABS 10-12/13 showed Chems- wnl;  CBC- wnl  LABS 2/14:  FLP- ok x LDL=110;  TSH=1.33   ~  Jul 03, 2012:  28mo ROV & post hospital visit> [facility] 4/22-23/14 after she presented w/ a pill (B-complex vit) aspiration and wheezing, SOB, asthma exac; she was able to cough it all out & did not require bronchoscopy; she denied dysphagia, n/v, etc;  Still not feeling well, min residual cough, notes some neck pain (XRay showed Cspine spondylosis) & she wants MRI=> referral to NS or whatever is necessary; we reviewed Rx w/ Tramadol, Robaxin as well... Her CC today sounds like reactive hypoglycemia & we reviewed diet adjustment- low glycemic index foods, more freq small meals if nec, etc...     We reviewed prob list, meds, xrays and labs> see below for updates >>   CXR 4/14 showed norm heart size, s/p CABG, tort thor Ao, bilat apical pleural scarring, NAD.Marland KitchenMarland Kitchen  ~  September 29, 2012:  109mo ROV & [name] had her MRI CSpine 5/14> multilevel facet hypertrophy, foraminal narrowing & stenosis, disc osteophyte complexes, some central canal stenosis=> she was sent to NS, [name] who rec Mobic & PT (helped alittle) but she was not pleased and is requesting a Rheumatology 2nd opinion about her neck discomfort; notes that insurance won't pay for musc relaxers...     She saw [name] for Cards 5/14> CAD, s/p CABG 2011, hx in-stent restenosis, unable to tol statins (even low dose intermittent rx); Myoview 5/13 was neg- no ischemia & EF>80%; no change in med rx...    She had f/u [name] 6/14> his note is reviewed- s/p lumpectomy, sentinel node, XRT; plan is to continue Femara2.5mg  thru 3/16 for  59yrs rx    She had GYN f/u 6/14 w/ [name]> doing satis x some dryness... We reviewed prob list, meds, xrays and labs> see below for updates >>   ~  February 03, 2013:  67mo ROV & [name] had Rheum consult [name]- last seen 9/14 & note reviewed> neck pain, this exac HAs/migraines, dizzy/ light headed; MRI=> NS consult & not a surg cand & NSAIDs/ PT w/ min benefit; saw Neurology- tried Pamelor but feels hung-over; for her DDD & OA they rec Flex5mg Tid which has helped some & she requests refill today- OK...     She saw [name] 9/14 after ER visit for HA> hx migraines and mixed HAs; CT Brain was neg & MRI CSpine showed multilevel DDD & some foraminal stenosis; Labs were neg including CRP & Sed; they tried Nortrip & Mobic but only min benefit...    She had routine f/u [name] 10/14> Hx left breast cancer dx 10/12 & treated w/ lumpectomy, XRT, & now Femara; exam neg, f/u mammogram neg, rec f/u 44yr... We reviewed prob list, meds, xrays and labs> see below for updates >> BP remains well controlled on her 3 meds; she had the 2014 flu vaccine 10/14; she continues on allergy shots at the [facility]...  ~  August 11, 2013:  79mo ROV & [name] reports that she is stable overall just lack energy so she's eating extra protein to help w/ this she tells me... She has had numerous specialty follow up visits in the interval>>    She had an allergy f/u w/ [name] 3/15> AR on allergy shots, Allegra, Flonase, Saline; she is stable w/o recent exacerbation...    Hx Asthma on XopenexHFA as needed; she has done well w/o asthma exac, cough, sput, wheezing, or SOB...     She had a Cards f/u w/ [name] 1/15> HBP, CAD, HL; on ASA81, Metop50Bid, Hyzaar100-12.5, Imdur30; Hx neg Myoview 5/13 & doing satis- no changes made; BP today= 136/68 & she denies CP, palpit, SOB, dizzy, edema, etc...    Lipids controlled on diet alone (w/ FishOil & CoQ10); Last FLP was 2/14 showing TChol 194, TG 143, HDL 55, LDL 110; reminded of diet, exercise, &  need for Fasting blood work...    She has seen DrHawkes for rheum and [name] for NS in the past> told to f/u prn only...    She had a Neuro f/u 1/15> HAs followed by [name]; neg CTBrain & DDD on MRI Cspine; she refused to take Pamelor & uses Tramadol/ Flexeril as needed; no change in meds needed...     She saw Oncology 6/15 for f/u of her left Breast Cancer> on Femara2.5 & tol well, s/p lumpectomy & sentinel node bx 12/12, followed by XRT finished 3/13, & on Letrozole since then,  no known recurrence...  We reviewed prob list, meds, xrays and labs> see below for updates >>   ~  December 21, 2013:  25mo ROV & add-on requested for bronchitic exacerbation> [name] tells me that she noted the onset of cough, chest congestion, hoarse voice 7 min sore throat ~4d ago; she denied f/c/s but has chest soreness from the coughing 7 bringing up sm amt of yellow-green sput; of note she was recently (3wks ago) treated for a UTI (Proteus sens Cipro) & this resolved... Exam shows borderline BP= 160/78, otherw stable VS w/ O2sat95% on RA, sl pharyngeal erythema, no exudates, ears neg, no adenopathy, hoarse voice, chest exam w/ bilat rhonchi & exp wheezing... CXR shows norm heart size, s/pCABG, clear lungs w/o infiltrates... We discussed Rx w/ Depo80, Levaquin500 x7d, Mucinex 600-2Bid, Fluids, MMW & Delsym prn...     We reviewed prob list, meds, xrays and labs> see below for updates >>   CXR 10/15 showed normal heart size, s/p CABG, unchanged biapical pleural scarring, otherw clear lungs w/o infiltrates, NAD...   ~  December 30, 2013:  1wk ROV & post hosp check>  [name] was [facility] 10/29 - 12/27/13 by [facility] w/ HA, HBP, & hyponatremia (Na=119 ?etiology)> she had recently been treated for bronchitic exac w/ Levaquin & Mucinex, she thinks the HBP was a reaction "allergy" to Levaquin & doesn't want to take this antibiotic again; BP was 220 range in ER &treated w/ Cardene drip=> she was actually disch on less meds than PTA=> sdhe returned to  ER the day after disch w/ HBP & BP ~200 & she hadn't taken her Losartan yet, given IV hyralazine & BP improved to the 160s; now on Metop50Bid & Cozaar50 (prev on Hyzaar100-12.5); they suspected her diuretic in her hyponatremia which corrected w/ IVF; she has a hx of HAs from DJD/neck and FM; We reviewed the following medical problems during today's office visit >>     AR/ Asthma>  not on any inhaled meds at present; denies cough, sput, ch in dyspnea, etc; she has Allegra, Nasonex, Mucinex, Tessalon, MMW for her various symptoms...    HBP>  on Metop50Bid & Losar50 now w/ BP=146/82 today; denies visual changes, CP, palipit, dizziness, syncope, dyspnea, edema, etc; we decided to incr Losartan to 100 & add Hydralazine50-1/2Bid w/ ROV 2 wks...    CAD>  CP 11/11=> cath w/ in-stent restenosis, then CABG x3 by [name]; re-hospitalized 6/12 by Cards for CP> cath showed severe 3 vessel CAD w/ 3/3 grafts patent & norm LVF w/ EF= 65-70%; Myoview 5/13 showed no ischemia & EF>80%; no change in meds- on Imdur30; & [name] noted that med rx is lim by side effects.    Hyperlipid>  Intol to statins; followed in the [facility] & FLP on Cres10- 2d per week reviewed but she was unable to tolerate even this low dose; off all meds on diet alone now...    GI- GERD, Divertics> on Omep40 + Levsin0.125 & Align; followed by [name] & seen 9/13 f/u for ?diverticulitis- improved after Cipro/Flagyl but CTAbd was neg x diverticulosis; he rec high fiber diet & Citrucel...    Hx left breast cancer> she had f/u Oncology 12/13- on Femara2.5 & tol ok; last mammogram 10/13 was ok as well...    Others>  she has persistant FM symptoms and chr HAs, uses Tramadol & Advil Prn... We reviewed prob list, meds, xrays and labs> see below for updates >>   CXR 10/15 showed fe chr changes in lungs, norm  heart size & s/p CABG, DJD in spine, NAD.Marland KitchenMarland Kitchen  EKG 11/15 showed NSR, rate69, NSSTTWA, NAD...  Lab cumulative summary sheet reviewed...  PLAN>> we  decided to incr Losartan to 100mg /d and add Hydralazine50-1/2Bid, continue Metop50Bid and the Imdur30 as before; she knows to aqvoid excess sodium in her diet (esp now that hyponatremia resolved & she's off diuretic; ROV 2wks...    ~  January 14, 2014:  2wk ROV & another hospitalization in the interval> [name] has had a continuing saga of HA & HBP, she has been to the ER, to Cardiology, Hospitalized, back to Cardiology, and almost daily phone calls for HA & HBP w/ BP ~200 sys> all notes reviewed;  Cards eval for secondary cause of the HBP has been neg;  Initially it was assumed that incr BP resulted in HBP but she has a hx of chronic daily HAs and she continues to have HAs when her BP is controlled, and it seems more likely that the pain contributes to her HBP;  She was last Hosp 11/11 - 01/06/14 and disch on ASA81, Imdur60, Hydralazine50Tid, Losar100-1/2Bid, Metop50Bid;  BP at disch was 100/47;  She saw [name] 11/16 on these meds w/ BP recorded 130/64, Imdur was stopped due to her HAs, they plan f/u 69mo;  She has called daily since then w/ elev BP (esp at night she says) and we have rec strategies using her current meds to help her headaches and adjust BP meds:    HBP> Rec to adjust meds- take Metop50Tid, Losartan100-1/2Tid, Apresoline50- take 1/2 to 1tab Tid (she wants to do this since BP is low in AM & she feels weak, with BP elev in PM & she needs more)...    Chronic Daily HAs> she needs f/u appt w/ Neurology w/ their active management of this problem; she has Zanaflex2mg  & Cyclobenzaprine5mg - asked to try one of these taken Tid & if not improved then try the other Tid; rest, use heating pad on neck, and take Tramadol/ Tylenol up to 3 times daily as needed... We reviewed prob list, meds, xrays and labs> see below for updates >>   CXR 11/15 showed norm heart size, prev CABG, clear lungs- some hyperinflation/ NAD, DJD Tspine...  Renal Sonar 11/15 was wnl...  CT Head 11/15 showed cortical atrophy and  small vessel dis, sinuses clear, NAD.Marland KitchenMarland Kitchen   EKG 11/15 showed NSR, rate64, borderline tracing w/?LAE & min NSSTTWA...  2DEcho 11/15 showed norm cavity size & thickness, norm EF= 60-65%, norm wall motion, norm diastolic function, essentially norm valves...   Myoview 11/15 showed low risk scan w/ abn EKG portion but no stress induced perfusion defects, hyperdynamic LVF w/ EF>70% & no regional wall motion abn  LABS 11/15:  Chems- wnl w/ Cr=0.6;  CBC- ok w/ Hg= 12.3;  TSH=1.60 PLAN>>  take Metop50Tid, Losartan100-1/2Tid, Apresoline50- take 1/2 to 1tab Tid (she wants to do this since BP is low in AM & she feels weak, with BP elev in PM & she needs more); needs f/u appt w/ Neurology w/ their active management of this problem; she has Zanaflex2mg  & Cyclobenzaprine5mg - asked to try one of these taken Tid & if not improved then try the other Tid; rest, use heating pad on neck, and take Tramadol/ Tylenol up to 3 times daily as needed...  ~  February 04, 2014:  3wk ROV & [name] reports improved overall;  BP is better regulated but still sl volatile & she like the ability to adjust the Hydralazine> on Metop50Tid, Cozaar100- 1/2  tid, & Hydralazine50- 1/2to1Tid;  BP today= 144/78 and when it was up>160/90 last PM she took an extra 25mg  dose...  She saw Neuro [name] for her chronic daily HAs 11/15 and she agreed w/ our Rx for BP control + Muscle relaxer (she prefers the Zanaflex over the Flexeril) + Tramadol/ Tylenol for pain...  She is c/o a jitteriness/ shaking on the inside feeling and we discussed trying a regular medication to try to help elim this sensation> try KLONOPIN 0.5mg - 1/2 to 1 tab Bid... She will f/u in 31mo & call sooner prn problems...  ADDENDUM>> she never filled the Klonopin and refuses anxiolytic meds.  ~  April 13, 2014:  12mo ROV & [name] has had numerous medical follow up visits over the last 37mo>    She saw GI 12/15 w/ lower abd pain & nausea similar to prev bouts of diverticulitis in past;  CT  Abd&Pelvis showed mild sigmoid diverticulitis, no evid of abscess or complications;  She was treated w/ Cipro & Flagyl & improved;  She is maintained on Prilosec, Anaspaz, fiber & metamucil...     She saw Neuro, [name] 1/16 due to headaches> long hx migraines, recurrent prob since 2014, now 15/30 days per month, CT Brain was neg x atrophy & sm vessel dis, MRI Cspine w/ multilevel DDD etc; treated w/ Tramadol, Flexeril, Zanaflex and [name] has ordered a Sleep Study- still pending & sched at end of this month...     She also saw [name] for [facility] f/u 1/16> hx CAD, CABG 2011, HBP, HLD; Adm 10/15 w/ HBP & hyponatremia (Na=119), HCTZ was stopped, BP meds adjusted & now controlled on Metoprolol50Tid, Losar100-1/2Tid, Apres50-1/2Tid; BP today= 136/80, improved & she denies recent CP, palpit, dizzy, ch in SOB, edema;  2DEcho 11/15 showed norm LVF w/ EF=60-65% & no regional wall motion abn, trivial AI, otherw wnl; Lexiscan Myoview 11/15 showed low risk scan w/ abn EKG portion but no ischemic perfusion defects & hyperdynamic LV w/ EF=70% & no wall motion abn... We reviewed prob list, meds, xrays and labs> see below for updates >> we gave her the PREVNAR-13 today... She had the Shingles vaccination at [facility]... PLAN>> she will continue her same meds and plan f/u in ~105mo w/ fasting blood work at that time...   ~  May 03, 2014:  3wk ROV & pt called requesting urgent add-on appt for recurrent BP problems> Pt notes that BP has been well controlled on her regimen of low sodium, Metoprolol50Tid, Hydralazine50-1/2Tid, and Losartan100-1/2Tid> until 3d ago when she had the onset of another severe HA- assoc w/ the severe pain she noted that her BP was once again elevated (190-230/ 100-110 she says) & the usual doses of the 3 meds didn't bring it down; she tried taking the Tramadol50 as directed by [name] & added Tylenol plus her musc relaxers Flexeril & Zanaflex but the HA persisted; when she called [name]'s office she was  directed to call us for an appt because of the BP... NOTE> it has been determined by her Cardiologist [name] & I concur that in her case it's the HA PAIN that causes her BP to spike, and relief of the pain is paramount to controlling the BP in these instances...  In this regard she is directed to maintain a baseline BP regimen w/ Metoprolol50Tid, Apres50-1/2Tid & Losar100-1/2Tid;  But when she has a HA she will immediately start her Neurology action plan for her severe headaches, and check her BP Tid w/ adjustment of her  meds up to one of each tab Tid until the HA is resolved, then return to her baseline regimen... We reviewed this plan in detail & she will let us know if she has any questions...  PLAN>> continue baseline BP med regimen w/ METOPROLOL50Tid, APRESOLINE50-1/2Tid & LOSARTAN100-1/2Tid; continue to monitor BP at home; during episodes of severe HA pain- follow Neurologists HA action plan for control of the discomfort; in addition start to monitor BP 3 times daily & increase BP meds to 1 of each Tid until the HA is controlled & the BP returns to normal; then resume the baseline regimen...           Problem List:  GLAUCOMA (ICD-365.9) - she is sched for right cataract surg by DrBevis in Feb...  ALLERGIC RHINITIS (ICD-477.9) - she uses Allegra & Nasonex Prn; on Allergy shots weekly x yrs... ~  She is followed by [name] at the [place] [facility] facility=> she remains on allergy shots...  ASTHMA & Asthmatic Bronchitis >> off Symbicort & using XOPENEX HFA as needed rescue inhaler... ~  5/11:  notes incr chest symptoms in the heat & rec to take the Symbicort 2sp Bid... ~  2012:  After her CABG & repeat hosp by Cards she is noted to be off her inhalers... ~  10/12:  Treated w/ Zpak, Pred, Mucinex, plus her Symbicort80 & improved... ~  CXR 5/13 showed s/p CABG, clear lungs, NAD (she went to the Er w/ cough & had neg eval). ~  7/13:  [name] stopped her Symbicort &  switched to [employer] HFA as needed... ~  2/14: she does not list any inhaled meds at present & states breathing OK w/o exac... ~  4/14: she was hosp after pill asp w/ asthma exac & improved w/ supportive Rx, didn't require bronch, CXR was clear, grad improved post hosp... ~  CXR 4/14 showed normal heart size, s/p CABG, sl tort Ao, bilat apic pleural scarring, NAD...  ~  6/15: she is stable w/o asthma exac and has avoided URIs/ bronchitis/ etc... ~  10/15: presented w/ URI/ bronchitic exac w/ cough, yellow-green sput, congestion, hoarseness, etc; CXR w/o signs of pneumonia & treated w/ Depo, Levaquin, Mucinex, MMW, Delsym, etc...  ~  CXR 10/15 showed few chr changes in lungs, norm heart size & s/p CABG, DJD in spine, NAD  HYPERTENSION (ICD-401.9) - controlled on TOPROL 50mg Bid & HYZAAR 100-12.5 daily (off Lisinopril due to ACE cough)...  ~  8/12: BP=  128/84 & tol meds well; denies HA, fatigue, visual changes, CP, palipit, syncope, dyspnea, edema, etc... ~  2/13: BP= 140/64 & she remains essentially asymptomatic on Rx... ~  8/13:  BP= 132/64 & she denies CP, palpit, SOB, edema, etc... ~  2/14: controlled on Toprol & Hyzaar w/ BP 144/72 today; denies visual changes, CP, palipit, dizziness, syncope, dyspnea, edema, etc... ~  5/14:  BP= 136/60 & she remains stable... ~  8/14:  BP= 134/60 and she denies CP, palpit, dizzy, SOB, edema... ~  12/14: on Metop50Bid, Hyzaar 100-12.5, Imdur30;  BP= 140/80 & she remains largely asymptomatic... ~  6/15: on ASA81, Metop50Bid, Hyzaar100-12.5, Imdur30; BP today= 136/68 & she denies CP, palpit, SOB, dizzy, edema, etc... ~  10/15: on same meds w/ BP= 160/78 & no changes meds- advised to monitor BP at home once she's over the URI illness, take meds regualrly... ~  11/15: post Hosp for HBP, hyponatremia; they stopped diuretic & disch on Metop50Bid & Losar50; she went back  to ER the next day w/ BP~200 (hadn't taken her Losartan) & given IV Hydralazine w/ improvement to  160s; we decided to continue the BBlocker, increase the ARB to 100mg /d, and ADD HYDRALAZINE50- 1/2Bid for now; ROV 2 weeks... ~  11/15: she is s/p ER, Hosp, Cards visits & mult phone calls (see above)> meds ajusted- take Metop50Tid, Losartan100-1/2Tid, Apresoline50- 1/2 to 1tab Tid (she wants to do this since BP is low in AM & she feels weak, with BP elev in PM & she needs more)... ~  2/16: BP meds adjusted over the last few months & BP controlled on Metoprolol50Tid, Losar100-1/2Tid, Apres50-1/2Tid; BP today= 136/80, improved & she denies recent CP, palpit, dizzy, ch in SOB, edema... ~  3/16: pt presented w/ BP elev during recurrent bouts of HA pain- asked to increase her above meds to Metop50/ Losar100/ Apres50- at her Tid dosing intervals when needed for HBP while her HA meds are kicking in to resolve her pain; if her pain doesn't resolve & her BP does not improve, then she knows to go to the ER for immediate assessment...   CORONARY ARTERY DISEASE (ICD-414.00) - on ASA 81mg /d (intol to 325 she says) & off Plavix per [name]...  Cardiolite 9/08 showed CP & HBP response, EF+85%... had cath 10/08 w/ single vessel dis w/ mod ostial LAD stenosis and patent LAD stent, normal LVF.[name]. option for med Rx vs off pump LIMA... she notes some CP/ pressure "burning" when walking up a certain hill during her daily walks, but states this is stable and "no worse"... ~  Myoview 9/09 was neg- no scar or ischemia, EF= 89%, hypertensive response- Lisinopril10 added. ~  f/u Myoview 3/11 was neg- no scar or ischemia, EF= 86%, norm BP response, fair exerc capacity. ~  saw [name] 5/11- no CP, he stopped her Plavix, stable- continue other meds same. ~  She had recurrent CP 11/11 w/ in-stent restenosis on cath> subseq 3 vessel CABG by DrGearhart & doing well since then... ~  6/12:  Hosp w/ CP & repeat cath showed CAD & 3/3 grafts patent, EF= 65-70% ~  Myoview 5/13 was felt to be a normal stress nuclear study- normal wall  motion, no ischemia, EF=84% ~  2/14: CP 11/11=> cath w/ in-stent restenosis, then CABG x3 by [name]; re-hospitalized 6/12 by [facility] for CP> cath showed severe 3 vessel CAD w/ 3/3 grafts patent & norm LVF w/ EF= 65-70%; Myoview 5/13 showed no ischemia & EF>80%; no change in meds; & [name] noted that med rx is limited by side effects... ~  8/14 & 12/14: she remains stable on Imdur30 + Metop50Bid 7 Hyzaar100-12.5; denies CP, palpit, SOB, etc... ~  6/15: Cards f/u w/ [name] 1/15> HBP, CAD, HL; on ASA81, Metop50Bid, Hyzaar100-12.5, Imdur30; Hx neg Myoview 5/13 & doing satis- no changes made; BP today= 136/68 & she denies CP, palpit, SOB, dizzy, edema, etc ~  EKG 11/15 showed NSR, rate69, NSSTTWA, NAD  ~  2DEcho 11/15 showed  norm LVF w/ EF=60-65% & no regional wall motion abn, trivial AI, otherw wnl. ~  Myoview 11/15 showed low risk scan w/ abn EKG portion but no stress induced perfusion defects, hyperdynamic LVF w/ EF>70% & no regional wall motion abn. ~  11/15: she is off the Imdur per [name] due to HAs... ~  1/16: she had f/u [name]> stable on meds and no changes made...  HYPERLIPIDEMIA (ICD-272.4) - on CRESTOR 10mg - 1/2 on M&F, FISH OIL 1200mg  Tid + Flax Seed Oil... she has  been intol to statins in the past>  tried low dose Trilipix but stopped this due to "thigh burning">  now followed in the [facility]--- ~  [place] 7/08 showed TChol 217, TG 236, HDL 46, LDL 150...  ~  [place] 6/09 showed TChol 233, TG 263, HDL 46, LDL 150... she agrees to try LOW DOSE Trilipix. ~  FLP 11/09 showed TChol 162, TG 167, HDL 45, LDL 83... ~  [place] 11/10 showed TChol 240, TG 271, HDL 45, LDL 150... LipClin Rx w/ FishOil & FlaxSeedOil. ~  FLP 2/11 showed TChol 285, TG 156, HDL 79, LDL 180... LC is aware & working w/ her regularly. ~  [place] 5/11 showed TChol 277, TG 241, HDL55, LDL 193...  LC added Cres10 just 2d per week. ~  FLP 10/11 showed TChol 198, TG 215, HDL 53, LDL 109... much improved on Cres10 twice weekly. ~   She continues to f/u w/ Lipid clinic & they titrated up the Cres10 on M&F, plus 5mg  on Wednes> ~  FLP  10/12 was the best yet w/ TChol 167, TG 203, HDL 54, LDL 88...  ~  [place] 4/13 showed TChol 193, TG 176, HDL 57, LDL 101 ~  She is still followed in the [facility]- note 6/13 reviewed (at that time on Cres10 on M&F, 5mg  on W) but she subseq cut herself down to 1/2 on M&F due to nausea & aching. ~  FLP 2/14 on diet alone showed TChol 194, TG 143, HDL 55, LDL 110... We reviewed diet, exercise, etc... ~  She remains off the [place] on diet alone and she declines to restart low dose rx "I can't tolerate them"  GERD (ICD-530.81) - on OMEPRAZOLE 40mg /d... last EGD by [name] 8/07 revealed sm HH, & gastritis and dilatation done... (RUT=neg, PPI Rx)... LEVSIN 0.125mg  helps her esoph spasm... Prn Phenergan for nausea.  DIVERTICULOSIS, COLON (ICD-562.10) & IRRITABLE BOWEL SYNDROME (ICD-564.1) ~  colonoscopy 2/03 by [name] showed divertics and 5 mm polyp... ~  colonoscopy 3/11 showed divertics & cecal polyp= tubular adenoma, w/ f/u planned 53yrs. ~  9/13: seen by [name] f/u for ?diverticulitis- improved after Cipro/Flagyl but CTAbd was neg x diverticulosis; he rec high fiber diet & Citrucel...  ? GALLSTONES>>  Diagnosed by [name] and referred to [facility] DrWilson, pt asked to call prn fopr incr symptoms to consider surg... ~  Abd Ultrasound 10/13 by [name] for eval CP showed> norm GB, liver, kidneys, & Ao- neg sonar.  BREAST CANCER >> Abn mammogram 10/12 showing a left breast nodule & subseq surg (partial mastectomy & sentinel node bx) by [name] 12/12 that proved to be an invasive ductal carcinoma, 2.3cm size, w/ neg lymphovasc invasion, clear margins (but close at 39mm), & neg nodes; she had XRT by DrKindard (had her 11th treatment today); and Oncology eval by [name] who plans hormone Rx later (no chemo she says)...  ~  2/13: She is tolerating treatment well & spirits are up, [name] had to aspirate  a seroma & it is now resolved... ~  She is followed by [name], Streck, [place]- now on [facility] 2.5mg /d w/ 64yrs planned... ~  6/14:  She had f/u [name] 6/14> his note is reviewed- s/p lumpectomy, sentinel node, XRT; plan is to continue Femara2.5mg  thru 3/16 for 63yrs rx. ~  10/14:  She had routine f/u [name] 10/14> Hx left breast cancer dx 10/12 & treated w/ lumpectomy, XRT, & now Femara; exam neg, f/u mammogram neg, rec f/u 50yr. ~  6/15:  She saw  Oncology for f/u of her left Breast Cancer> on Femara2.5 & tol well, s/p lumpectomy & sentinel node bx 12/12, followed by XRT finished 3/13, & on Letrozole since then, no known recurrence...   DJD, Neck Pain >> ~  XRays Cspine 4/14 in hosp showed multilevel cerv spondylosis, 1-52mm anterolisthesis C4 on C5, DDD, foraminal stenosis at C5-6...  ~  5/14:  MRI Cspine => multilevel facet hypertrophy, foraminal narrowing & stenosis, disc osteophyte complexes, some central canal stenosis=> she was sent to NS, [name] who rec Mobic & PT (helped alittle) but she was not pleased... ~  8/14:  She is requesting a Rheumatology 2nd opinion about her neck discomfort=> seen by [facility]- neck pain, this exac HAs/migraines, dizzy/ light headed; MRI=> NS consult & not a surg cand & NSAIDs/ PT w/ min benefit; saw Neurology- tried Pamelor but feels hung-over; for her DDD & OA they rec Flex5mg Tid which has helped some...  FIBROMYALGIA (ICD-729.1) - she c/o chr fatigue, aching/ sore, on Tramadol & Tylenol, but most benefit from low dose Hydrocodone ~1/2 tab Prn... I have recommended an increase exercise program to her... ~  5/11:  Vit D level = 50 on 50000 u weekly by Helene Shoe, NP  HEADACHE (ICD-784.0) - eval at [facility] clinic in 2002, but she notes Rx didn't help... ~  3/11:  presented w/ muscle contraction HA's> Rx Tramadol 50mg  Q6H Prn. ~  She has persistent/ recurrent HAs... ~  11/15: her chronic daily HAs have again risen in improtance due to interaction w/ her  HBP> asked to f/u w/ Neuro ASAP & Rx w/ rest, heating pad, Tramadol50/ Tylenol, and musc relaxer- Zanaflex2Tid vs Flexeril5Tid...  DIZZINESS, CHRONIC (ICD-780.4) - MRI 2/09 per [name] showed atrophy, sm vessel dis, NAD...  Health Maintenance: ~  GI:  [name] & up to date on colon screening. ~  GYN:  yearly f/u w/ Helene Shoe, NP for PAP, Mammogram at [facility], ?last BMD. ~  Immunizations:  yearly Flu vaccines in fall, Pneumovax in 2007?, TDAP given 5/11...   Past Surgical History  Procedure Laterality Date  . Coronary angioplasty with stent placement      Stent 2007  . Open heart surgery      01/19/2010  . Coronary artery bypass graft    . Cataract extraction, bilateral      bilateral caaract removal,  . Breast lumpectomy Left 02/06/11    Outpatient Encounter Prescriptions as of 05/03/2014  Medication Sig  . aspirin EC 81 MG tablet Take 81 mg by mouth daily.  . cyclobenzaprine (FLEXERIL) 5 MG tablet take 1/2 to 1 tablet by mouth twice a day if needed  . fexofenadine (ALLEGRA) 180 MG tablet Take 180 mg by mouth daily.    . fluocinonide (LIDEX) 0.05 % external solution Apply 1 application topically daily as needed (scalp itching).   . Glucosamine-Chondroit-Vit C-Mn (GLUCOSAMINE CHONDR 1500 COMPLX PO) Take 1 tablet by mouth 3 (three) times daily.    . hydrALAZINE (APRESOLINE) 50 MG tablet Take 1/2 tablet by mouth three times daily  . hyoscyamine (ANASPAZ) 0.125 MG TBDP disintergrating tablet Place 0.125 mg under the tongue every 6 (six) hours as needed for bladder spasms or cramping.  [name] ketoconazole (NIZORAL) 2 % shampoo Apply 1 application topically 2 (two) times a week. As needed for itching a couple of times week  . latanoprost (XALATAN) 0.005 % ophthalmic solution Place 1 drop into both eyes at bedtime.   [name] letrozole (FEMARA) 2.5 MG tablet Take 1 tablet (2.5 mg  total) by mouth daily.  [name] losartan (COZAAR) 100 MG tablet Take 1/2 tablet by mouth three times daily  . metoprolol  (LOPRESSOR) 50 MG tablet Take 1 tablet (50 mg total) by mouth 3 (three) times daily.  . mometasone (NASONEX) 50 MCG/ACT nasal spray Place 2 sprays into the nose daily as needed (congestion).   . nitroGLYCERIN (NITROSTAT) 0.4 MG SL tablet Place 1 tablet (0.4 mg total) under the tongue every 5 (five) minutes as needed for chest pain.  . Omega-3 Fatty Acids (FISH OIL) 1200 MG CAPS Take 1,200 mg by mouth daily.   [name] omeprazole (PRILOSEC) 40 MG capsule Take 1 capsule (40 mg total) by mouth daily. 30 minutes before 1st meal of the day  . tiZANidine (ZANAFLEX) 2 MG tablet Take 2 mg by mouth at bedtime.  . traMADol (ULTRAM) 50 MG tablet Take 1 tablet (50 mg total) by mouth 3 (three) times daily as needed.  . vitamin B-12 (CYANOCOBALAMIN) 1000 MCG tablet Take 1,000 mcg by mouth daily.      Allergies  Allergen Reactions  . Choline Fenofibrate Other (See Comments)     pt states INTOL to Trilipix w/ "thigh burning"  . Simvastatin Other (See Comments)     pt states INTOL to STATINS \\T \ refuses to restart  . Hctz [Hydrochlorothiazide]     Causes hyponatremia  . Hydrocodone     Nightmare after taking cough syrup w/hydrocodone  . Levaquin [Levofloxacin In D5w]     Elevated BP  . Adhesive [Tape] Rash  . Ceclor [Cefaclor] Rash  . Clarithromycin Rash  . Codeine Nausea Only  . Doxycycline Rash  . Lisinopril Cough    Developed ACE cough...  . Penicillins Itching and Rash    At injection site  . Tobramycin-Dexamethasone Rash    Current Medications, Allergies, Past Medical History, Past Surgical History, Family History, and Social History were reviewed in [employer] record.    Review of Systems    See HPI - all other systems neg except as noted...  The patient complains of headaches.  The patient denies anorexia, fever, weight loss, weight gain, vision loss, decreased hearing, hoarseness, chest pain, syncope, dyspnea on exertion, peripheral edema, prolonged cough, hemoptysis,  abdominal pain, melena, hematochezia, severe indigestion/heartburn, hematuria, incontinence, muscle weakness, suspicious skin lesions, transient blindness, difficulty walking, depression, unusual weight change, abnormal bleeding, enlarged lymph nodes, and angioedema.   Objective:   Physical Exam    WD, WN, 79 y/o WF in NAD... GENERAL:  Alert & oriented; pleasant & cooperative... HEENT:  /AT, EOM-wnl, PERRLA, EACs-clear, TMs-wnl, NOSE-clear, THROAT- clear & wnl. NECK:  Decr ROM; no JVD; normal carotid impulses w/o bruits; no thyromegaly or nodules palpated; no lymphadenopathy. CHEST:  Clear- no wheezing, rales, or signs of consolidation... HEART:  Regular Rhythm; without murmurs/ rubs/ or gallops detected... ABDOMEN:  Soft & nontender; normal bowel sounds; no organomegaly or masses palpated... EXT: without deformities, mild arthritic changes and +trigger points, no varicose veins/ venous insuffic/ or edema. NEURO:  CN's intact; motor testing normal; sensory testing normal; gait normal & balance OK. DERM:  No lesions noted; no rash etc...  RADIOLOGY DATA:  Reviewed in the EPIC EMR & discussed w/ the patient...  LABORATORY DATA:  Reviewed in the EPIC EMR & discussed w/ the patient...   Assessment & Plan:    Asthma/ AR>  Stable on allergy shots & XOPENEX as needed; no recent exac... Hx pill asp w/ asthma exac=> resolved...  HBP>  Controlled on BP  med regimen of Metoprolol50Tid, Losartan100-1/2Tid, Hydralazine50-1/2Tid... BP is noted to be up when she has pain, esp HA pain; Headaches are managed by Luvenia Redden on Tramadol, Flexeril, Zanaflex... 3/16> she presented w/ severe HA and assoc elev BP readings- we rec increasing her 3 meds to 1 of each Tid to manage the BP until the pain abates...  CAD>  Cath w/ patent grafts; had neg Nuclear study; continue same meds and f/u [name]...  CHOL>  Followed in the [facility] & intol to all meds- FLP looks better however on diet alone...  GI>  GERD, Divertics, ?Gallstones>  Followed by [facility] & stones eval by DrWilson [facility]; odd that f/u sonar 9/13 did not show stones...  BREAST CANCER>  on FEMARA > treated by [name], [name], DrKinard & records reviewed...  FM/ HAs/ etc>  On Ultram, Tylenol, etc => needs active management of this prob by Neuro w/ an "action plan" for severe HA episodes... NECK PAIN> with multilevel spondylosis & eval by NS, [name] but she was not pleased & is asking for Rheum eval...  Other medical issues as noted> on CoQ10, Glucosamine, Vit B12, Vit D, etc...   Patient's Medications  New Prescriptions   No medications on file  Previous Medications   ASPIRIN EC 81 MG TABLET    Take 81 mg by mouth daily.   CYCLOBENZAPRINE (FLEXERIL) 5 MG TABLET    take 1/2 to 1 tablet by mouth twice a day if needed   FEXOFENADINE (ALLEGRA) 180 MG TABLET    Take 180 mg by mouth daily.     FLUOCINONIDE (LIDEX) 0.05 % EXTERNAL SOLUTION    Apply 1 application topically daily as needed (scalp itching).    GLUCOSAMINE-CHONDROIT-VIT C-MN (GLUCOSAMINE CHONDR 1500 COMPLX PO)    Take 1 tablet by mouth 3 (three) times daily.     HYDRALAZINE (APRESOLINE) 50 MG TABLET    Take 1/2 tablet by mouth three times daily   HYOSCYAMINE (ANASPAZ) 0.125 MG TBDP DISINTERGRATING TABLET    Place 0.125 mg under the tongue every 6 (six) hours as needed for bladder spasms or cramping.   KETOCONAZOLE (NIZORAL) 2 % SHAMPOO    Apply 1 application topically 2 (two) times a week. As needed for itching a couple of times week   LATANOPROST (XALATAN) 0.005 % OPHTHALMIC SOLUTION    Place 1 drop into both eyes at bedtime.    LETROZOLE (FEMARA) 2.5 MG TABLET    Take 1 tablet (2.5 mg total) by mouth daily.   LOSARTAN (COZAAR) 100 MG TABLET    Take 1/2 tablet by mouth three times daily   METOPROLOL (LOPRESSOR) 50 MG TABLET    Take 1 tablet (50 mg total) by mouth 3 (three) times daily.   MOMETASONE (NASONEX) 50 MCG/ACT NASAL SPRAY    Place 2 sprays into the nose  daily as needed (congestion).    NITROGLYCERIN (NITROSTAT) 0.4 MG SL TABLET    Place 1 tablet (0.4 mg total) under the tongue every 5 (five) minutes as needed for chest pain.   OMEGA-3 FATTY ACIDS (FISH OIL) 1200 MG CAPS    Take 1,200 mg by mouth daily.    OMEPRAZOLE (PRILOSEC) 40 MG CAPSULE    Take 1 capsule (40 mg total) by mouth daily. 30 minutes before 1st meal of the day   TIZANIDINE (ZANAFLEX) 2 MG TABLET    Take 2 mg by mouth at bedtime.   TRAMADOL (ULTRAM) 50 MG TABLET    Take 1 tablet (50 mg total) by  mouth 3 (three) times daily as needed.   VITAMIN B-12 (CYANOCOBALAMIN) 1000 MCG TABLET    Take 1,000 mcg by mouth daily.    Modified Medications   No medications on file  Discontinued Medications   LOSARTAN (COZAAR) 50 MG TABLET    Take 1/2 tablet by mouth three times daily

## 2014-05-04 ENCOUNTER — Other Ambulatory Visit: Payer: Self-pay

## 2014-05-04 ENCOUNTER — Emergency Department (HOSPITAL_BASED_OUTPATIENT_CLINIC_OR_DEPARTMENT_OTHER)
Admission: EM | Admit: 2014-05-04 | Discharge: 2014-05-04 | Disposition: A | Discharge status: Home / Self Care | Payer: Medicare Other | Attending: Emergency Medicine | Admitting: Emergency Medicine

## 2014-05-04 ENCOUNTER — Emergency Department (HOSPITAL_BASED_OUTPATIENT_CLINIC_OR_DEPARTMENT_OTHER): Payer: Medicare Other

## 2014-05-04 ENCOUNTER — Encounter (HOSPITAL_BASED_OUTPATIENT_CLINIC_OR_DEPARTMENT_OTHER): Payer: Self-pay | Admitting: *Deleted

## 2014-05-04 DIAGNOSIS — R1013 Epigastric pain: Secondary | ICD-10-CM

## 2014-05-04 DIAGNOSIS — Z9861 Coronary angioplasty status: Secondary | ICD-10-CM | POA: Diagnosis not present

## 2014-05-04 DIAGNOSIS — Z7982 Long term (current) use of aspirin: Secondary | ICD-10-CM | POA: Diagnosis not present

## 2014-05-04 DIAGNOSIS — Z8742 Personal history of other diseases of the female genital tract: Secondary | ICD-10-CM | POA: Insufficient documentation

## 2014-05-04 DIAGNOSIS — Z951 Presence of aortocoronary bypass graft: Secondary | ICD-10-CM | POA: Insufficient documentation

## 2014-05-04 DIAGNOSIS — I10 Essential (primary) hypertension: Secondary | ICD-10-CM | POA: Diagnosis not present

## 2014-05-04 DIAGNOSIS — E780 Pure hypercholesterolemia: Secondary | ICD-10-CM | POA: Diagnosis not present

## 2014-05-04 DIAGNOSIS — K5730 Diverticulosis of large intestine without perforation or abscess without bleeding: Secondary | ICD-10-CM | POA: Diagnosis not present

## 2014-05-04 DIAGNOSIS — M797 Fibromyalgia: Secondary | ICD-10-CM | POA: Insufficient documentation

## 2014-05-04 DIAGNOSIS — I2510 Atherosclerotic heart disease of native coronary artery without angina pectoris: Secondary | ICD-10-CM | POA: Insufficient documentation

## 2014-05-04 DIAGNOSIS — Z8669 Personal history of other diseases of the nervous system and sense organs: Secondary | ICD-10-CM | POA: Insufficient documentation

## 2014-05-04 DIAGNOSIS — Z853 Personal history of malignant neoplasm of breast: Secondary | ICD-10-CM | POA: Diagnosis not present

## 2014-05-04 DIAGNOSIS — K760 Fatty (change of) liver, not elsewhere classified: Secondary | ICD-10-CM | POA: Diagnosis not present

## 2014-05-04 DIAGNOSIS — Z880 Allergy status to penicillin: Secondary | ICD-10-CM | POA: Insufficient documentation

## 2014-05-04 DIAGNOSIS — K219 Gastro-esophageal reflux disease without esophagitis: Secondary | ICD-10-CM | POA: Diagnosis not present

## 2014-05-04 DIAGNOSIS — Z86010 Personal history of colonic polyps: Secondary | ICD-10-CM | POA: Diagnosis not present

## 2014-05-04 DIAGNOSIS — Z79899 Other long term (current) drug therapy: Secondary | ICD-10-CM | POA: Insufficient documentation

## 2014-05-04 HISTORY — DX: Unspecified asthma, uncomplicated: J45.909

## 2014-05-04 LAB — URINALYSIS, ROUTINE W REFLEX MICROSCOPIC
Bilirubin Urine: NEGATIVE
Glucose, UA: NEGATIVE mg/dL
Hgb urine dipstick: NEGATIVE
Ketones, ur: NEGATIVE mg/dL
Leukocytes, UA: NEGATIVE
Nitrite: NEGATIVE
Protein, ur: NEGATIVE mg/dL
Specific Gravity, Urine: 1.009 (ref 1.005–1.030)
Urobilinogen, UA: 0.2 mg/dL (ref 0.0–1.0)
pH: 7 (ref 5.0–8.0)

## 2014-05-04 LAB — CBC WITH DIFFERENTIAL/PLATELET
BASOS ABS: 0 10*3/uL (ref 0.0–0.1)
Band Neutrophils: 0 % (ref 0–10)
Basophils Relative: 0 % (ref 0–1)
Blasts: 0 %
Eosinophils Absolute: 0.1 10*3/uL (ref 0.0–0.7)
Eosinophils Relative: 1 % (ref 0–5)
HCT: 40.1 % (ref 36.0–46.0)
HEMOGLOBIN: 13.7 g/dL (ref 12.0–15.0)
LYMPHS ABS: 2.9 10*3/uL (ref 0.7–4.0)
Lymphocytes Relative: 46 % (ref 12–46)
MCH: 31.1 pg (ref 26.0–34.0)
MCHC: 34.2 g/dL (ref 30.0–36.0)
MCV: 90.9 fL (ref 78.0–100.0)
Metamyelocytes Relative: 0 %
Monocytes Absolute: 0.1 10*3/uL (ref 0.1–1.0)
Monocytes Relative: 1 % — ABNORMAL LOW (ref 3–12)
Myelocytes: 0 %
Neutro Abs: 3.1 10*3/uL (ref 1.7–7.7)
Neutrophils Relative %: 52 % (ref 43–77)
PLATELETS: UNDETERMINED 10*3/uL (ref 150–400)
Promyelocytes Absolute: 0 %
RBC: 4.41 MIL/uL (ref 3.87–5.11)
RDW: 14 % (ref 11.5–15.5)
WBC: 6.2 10*3/uL (ref 4.0–10.5)
nRBC: 0 /100 WBC

## 2014-05-04 LAB — TROPONIN I: Troponin I: <0.03 ng/mL (ref <0.031)

## 2014-05-04 LAB — COMPREHENSIVE METABOLIC PANEL
ALT: 22 U/L (ref 0–35)
ANION GAP: 6 (ref 5–15)
AST: 26 U/L (ref 0–37)
Albumin: 4.5 g/dL (ref 3.5–5.2)
Alkaline Phosphatase: 81 U/L (ref 39–117)
BILIRUBIN TOTAL: 1.1 mg/dL (ref 0.3–1.2)
BUN: 13 mg/dL (ref 6–23)
CO2: 26 mmol/L (ref 19–32)
Calcium: 9 mg/dL (ref 8.4–10.5)
Chloride: 97 mmol/L (ref 96–112)
Creatinine, Ser: 0.59 mg/dL (ref 0.50–1.10)
GFR, EST NON AFRICAN AMERICAN: 87 mL/min — AB (ref >90)
GLUCOSE: 93 mg/dL (ref 70–99)
POTASSIUM: 3.8 mmol/L (ref 3.5–5.1)
Sodium: 129 mmol/L — ABNORMAL LOW (ref 135–145)
Total Protein: 7.8 g/dL (ref 6.0–8.3)

## 2014-05-04 LAB — LIPASE, BLOOD: LIPASE: 23 U/L (ref 11–59)

## 2014-05-04 MED ORDER — ONDANSETRON 4 MG PO TBDP: ORAL_TABLET | ORAL | Status: DC

## 2014-05-04 MED ORDER — MORPHINE SULFATE 2 MG/ML IJ SOLN
2.0000 mg | Freq: Once | INTRAMUSCULAR | Status: AC
  Administered 2014-05-04: 2 mg via INTRAVENOUS
  Filled 2014-05-04: qty 1

## 2014-05-04 MED ORDER — ONDANSETRON HCL 4 MG/2ML IJ SOLN
4.0000 mg | Freq: Once | INTRAMUSCULAR | Status: AC
  Administered 2014-05-04: 4 mg via INTRAVENOUS
  Filled 2014-05-04: qty 2

## 2014-05-04 MED ORDER — GI COCKTAIL ~~LOC~~
30.0000 mL | Freq: Once | ORAL | Status: AC
Start: 2014-05-04 — End: 2014-05-04
  Administered 2014-05-04: 30 mL via ORAL
  Filled 2014-05-04: qty 30

## 2014-05-04 MED ORDER — SODIUM CHLORIDE 0.9 % IV BOLUS (SEPSIS)
500.0000 mL | Freq: Once | INTRAVENOUS | Status: AC
  Administered 2014-05-04: 500 mL via INTRAVENOUS

## 2014-05-04 MED ORDER — IOHEXOL 300 MG/ML  SOLN
100.0000 mL | Freq: Once | INTRAMUSCULAR | Status: AC | PRN
  Administered 2014-05-04: 100 mL via INTRAVENOUS

## 2014-05-04 MED ORDER — IOHEXOL 300 MG/ML  SOLN
50.0000 mL | Freq: Once | INTRAMUSCULAR | Status: AC | PRN
  Administered 2014-05-04: 50 mL via ORAL

## 2014-05-04 NOTE — ED Provider Notes (Signed)
CSN: [id number]     Arrival date & time 05/04/14  0754 History   First MD Initiated Contact with Patient 05/04/14 [phone]     Chief Complaint  Patient presents with  . Abdominal Pain     (Consider location/radiation/quality/duration/timing/severity/associated sxs/prior Treatment) Patient is a 79 y.o. female presenting with abdominal pain. The history is provided by the patient.  Abdominal Pain Pain location:  Epigastric Pain quality: bloating and fullness   Pain radiates to:  Back Pain severity:  Mild Onset quality:  Sudden Duration:  3 hours Timing:  Constant Progression:  Unchanged Chronicity:  New Context comment:  At rest Relieved by:  Nothing Worsened by:  Nothing tried Ineffective treatments:  None tried Associated symptoms: no chest pain, no cough, no diarrhea, no dysuria, no fatigue, no fever, no hematuria, no nausea, no shortness of breath and no vomiting     Past Medical History  Diagnosis Date  . Diverticulosis of colon (without mention of hemorrhage)   . Irritable bowel syndrome   . Colonic polyp 04-27-2009    tubular adenoma  . Headache(784.0)     a. frequently assocaited with high BPs  . Breast cancer, Left 12/20/2010    NO BLOOD PRESSURE CHECKS OR STICKS IN LEFT ARM  . Gallstones   . CAD (coronary artery disease)     a. 01/19/2010 s/p CABG x 3, lima->lad, vg->diag, vg->om1;  b. 06/2011 :Lexi MV: EF 84%, No ischemia. c. 01/06/14 s/p negative nuclear stress test with EF >70%  . Fibromyalgia   . Atrophic vaginitis   . GERD (gastroesophageal reflux disease)   . Hypercholesterolemia   . Glaucoma   . Cervical arthritis   . Hiatal hernia   . Labile hypertension    Past Surgical History  Procedure Laterality Date  . Coronary angioplasty with stent placement      Stent 2007  . Open heart surgery      01/19/2010  . Coronary artery bypass graft    . Cataract extraction, bilateral      bilateral caaract removal,  . Breast lumpectomy Left 02/06/11   Family  History  Problem Relation Age of Onset  . Heart disease Brother   . Cancer Brother     gland cancer  . Diabetes Sister   . Breast cancer Sister   . Stroke Mother   . Stroke Father   . Colon cancer Neg Hx   . Stomach cancer Neg Hx   . Pancreatic cancer Neg Hx   . Prostate cancer Father   . Kidney disease Neg Hx   . Liver disease Neg Hx    History  Substance Use Topics  . Smoking status: Never Smoker   . Smokeless tobacco: Never Used  . Alcohol Use: No   OB History    Gravida Para Term Preterm AB TAB SAB Ectopic Multiple Living   3 2   1     2      Review of Systems  Constitutional: Negative for fever and fatigue.  HENT: Negative for congestion and drooling.   Eyes: Negative for pain.  Respiratory: Negative for cough and shortness of breath.   Cardiovascular: Negative for chest pain.  Gastrointestinal: Positive for abdominal pain. Negative for nausea, vomiting and diarrhea.  Genitourinary: Negative for dysuria and hematuria.  Musculoskeletal: Negative for back pain, gait problem and neck pain.  Skin: Negative for color change.  Neurological: Negative for dizziness and headaches.  Hematological: Negative for adenopathy.  Psychiatric/Behavioral: Negative for behavioral problems.  All other  systems reviewed and are negative.     Allergies  Choline fenofibrate; Simvastatin; Hctz; Hydrocodone; Levaquin; Adhesive; Ceclor; Clarithromycin; Codeine; Doxycycline; Lisinopril; Penicillins; and Tobramycin-dexamethasone  Home Medications   Prior to Admission medications   Medication Sig Start Date End Date Taking? Authorizing Provider  aspirin EC 81 MG tablet Take 81 mg by mouth daily.   Yes Historical Provider, MD  cyclobenzaprine (FLEXERIL) 5 MG tablet take 1/2 to 1 tablet by mouth twice a day if needed 03/07/14  Yes Noralee Space, MD  fexofenadine (ALLEGRA) 180 MG tablet Take 180 mg by mouth daily.     Yes Historical Provider, MD  fluocinonide (LIDEX) 0.05 % external solution  Apply 1 application topically daily as needed (scalp itching).  04/29/13  Yes Historical Provider, MD  Glucosamine-Chondroit-Vit C-Mn (GLUCOSAMINE CHONDR 1500 COMPLX PO) Take 1 tablet by mouth 3 (three) times daily.     Yes Historical Provider, MD  hydrALAZINE (APRESOLINE) 50 MG tablet Take 1/2 tablet by mouth three times daily   Yes Historical Provider, MD  hyoscyamine (ANASPAZ) 0.125 MG TBDP disintergrating tablet Place 0.125 mg under the tongue every 6 (six) hours as needed for bladder spasms or cramping.   Yes Historical Provider, MD  ketoconazole (NIZORAL) 2 % shampoo Apply 1 application topically 2 (two) times a week. As needed for itching a couple of times week 04/29/13  Yes Historical Provider, MD  latanoprost (XALATAN) 0.005 % ophthalmic solution Place 1 drop into both eyes at bedtime.  07/31/12  Yes Historical Provider, MD  letrozole (FEMARA) 2.5 MG tablet Take 1 tablet (2.5 mg total) by mouth daily. 07/27/13  Yes Amy Milda Smart, PA-C  losartan (COZAAR) 100 MG tablet Take 1/2 tablet by mouth three times daily   Yes Historical Provider, MD  magnesium oxide (MAG-OX) 400 MG tablet Take 400 mg by mouth daily.   Yes Historical Provider, MD  metoprolol (LOPRESSOR) 50 MG tablet Take 1 tablet (50 mg total) by mouth 3 (three) times daily. 02/14/14  Yes Josue Hector, MD  mometasone (NASONEX) 50 MCG/ACT nasal spray Place 2 sprays into the nose daily as needed (congestion).    Yes Historical Provider, MD  nitroGLYCERIN (NITROSTAT) 0.4 MG SL tablet Place 1 tablet (0.4 mg total) under the tongue every 5 (five) minutes as needed for chest pain. 03/24/14  Yes Josue Hector, MD  Omega-3 Fatty Acids (FISH OIL) 1200 MG CAPS Take 1,200 mg by mouth daily.    Yes Historical Provider, MD  omeprazole (PRILOSEC) 40 MG capsule Take 1 capsule (40 mg total) by mouth daily. 30 minutes before 1st meal of the day 12/30/13  Yes Noralee Space, MD  PRESCRIPTION MEDICATION Vitamin B 2   Yes Historical Provider, MD  tiZANidine  (ZANAFLEX) 2 MG tablet Take 2 mg by mouth at bedtime.   Yes Historical Provider, MD  traMADol (ULTRAM) 50 MG tablet Take 1 tablet (50 mg total) by mouth 3 (three) times daily as needed. 12/30/13  Yes Noralee Space, MD  vitamin B-12 (CYANOCOBALAMIN) 1000 MCG tablet Take 1,000 mcg by mouth daily.     Yes Historical Provider, MD   BP 168/89 mmHg  Pulse 62  Temp(Src) 97.7 F (36.5 C) (Oral)  Ht 4\' 11"  (1.499 m)  Wt 134 lb (60.782 kg)  BMI 27.05 kg/m2  SpO2 97%  LMP 11/26/1990 Physical Exam  Constitutional: She is oriented to person, place, and time. She appears well-developed and well-nourished.  HENT:  Head: Normocephalic and atraumatic.  Mouth/Throat: Oropharynx is  clear and moist. No oropharyngeal exudate.  Eyes: Conjunctivae and EOM are normal. Pupils are equal, round, and reactive to light.  Neck: Normal range of motion. Neck supple.  Cardiovascular: Normal rate, regular rhythm, normal heart sounds and intact distal pulses.  Exam reveals no gallop and no friction rub.   No murmur heard. Pulmonary/Chest: Effort normal and breath sounds normal. No respiratory distress. She has no wheezes.  Abdominal: Soft. Bowel sounds are normal. There is tenderness (mild ttp of epig area). There is no rebound and no guarding.  Musculoskeletal: Normal range of motion. She exhibits no edema or tenderness.  No CVA ttp noted.   Neurological: She is alert and oriented to person, place, and time.  Skin: Skin is warm and dry.  Psychiatric: She has a normal mood and affect. Her behavior is normal.  Nursing note and vitals reviewed.   ED Course  Procedures (including critical care time) Labs Review Labs Reviewed  CBC WITH DIFFERENTIAL/PLATELET - Abnormal; Notable for the following:    Monocytes Relative 1 (*)    All other components within normal limits  COMPREHENSIVE METABOLIC PANEL - Abnormal; Notable for the following:    Sodium 129 (*)    GFR calc non Af Amer 87 (*)    All other components within  normal limits  TROPONIN I  LIPASE, BLOOD  URINALYSIS, ROUTINE W REFLEX MICROSCOPIC    Imaging Review Dg Chest 2 View  05/04/2014   CLINICAL DATA:  History of left breast cancer.  Epigastric pain.  EXAM: CHEST  2 VIEW  COMPARISON:  PA and lateral chest 01/05/2014.  FINDINGS: The patient is status post CABG. The lungs are clear. Heart size is normal. No pneumothorax or pleural effusion. Thoracic spondylosis is noted.  IMPRESSION: No acute disease.   Electronically Signed   By: Inge Rise M.D.   On: 05/04/2014 10:04   Ct Abdomen Pelvis W Contrast  05/04/2014   CLINICAL DATA:  Mid and upper abdominal pain beginning at 5 a.m. today. History of gallstones.  EXAM: CT ABDOMEN AND PELVIS WITH CONTRAST  TECHNIQUE: Multidetector CT imaging of the abdomen and pelvis was performed using the standard protocol following bolus administration of intravenous contrast.  CONTRAST:  50 mL OMNIPAQUE IOHEXOL 300 MG/ML SOLN, 100 mL OMNIPAQUE IOHEXOL 300 MG/ML SOLN  COMPARISON:  CT abdomen and pelvis 02/11/2014 11/07/2011.  FINDINGS: Mild dependent atelectasis is seen in the lung bases. Heart size is upper normal. No pleural or pericardial effusion.  The liver is diffusely low attenuating consistent with fatty infiltration. No focal liver lesion is identified. The gallbladder, adrenal glands, kidneys and pancreas are unremarkable. A 0.4 cm hypo attenuating lesion in the spleen is likely a cyst and unchanged.  Uterus, adnexa and urinary bladder appear normal. The patient has fairly extensive sigmoid diverticulosis without evidence of diverticulitis. The colon is otherwise normal in appearance. The stomach and small bowel appear normal. The appendix is not visualized and may have been removed. No inflammatory process is seen. No lymphadenopathy or fluid.  No lytic or sclerotic bony lesion is identified. Lumbar spondylosis is unchanged in appearance.  IMPRESSION: No acute finding abdomen or pelvis.  Sigmoid diverticulosis  without diverticulitis.  Fatty infiltration of the liver.   Electronically Signed   By: Inge Rise M.D.   On: 05/04/2014 09:59     EKG Interpretation   Date/Time:  Wednesday May 04 2014 08:28:00 EST Ventricular Rate:  56 PR Interval:  186 QRS Duration: 72 QT Interval:  424 QTC  Calculation: 409 R Axis:   12 Text Interpretation:  Sinus bradycardia Nonspecific ST abnormality No  significant change since last tracing Confirmed by Davidson Palmieri  MD, Kynzee Devinney  ([id number]) on 05/04/2014 8:29:15 AM      MDM   Final diagnoses:  Epigastric pain    8:22 AM 78 y.o. female w hx of CAD s/p CABG, gallstones, IBS who presents with epigastric pain which awoke her from sleep around 5 AM this morning. She states that the pain is diffuse in the upper abdomen and radiates to her back. She denies any fevers, vomiting, or diarrhea. 2 normal bowel movements already today. Vital signs are unremarkable here. Mild epigastric tenderness on exam. We'll get screening labs and imaging.  11:27 AM: I interpreted/reviewed the labs and/or imaging which were non-contributory.  Pt cont to appear well on exam.  I have discussed the diagnosis/risks/treatment options with the patient and family and believe the pt to be eligible for discharge home to follow-up with her pcp. We also discussed returning to the ED immediately if new or worsening sx occur. We discussed the sx which are most concerning (e.g., worsening pain) that necessitate immediate return. Medications administered to the patient during their visit and any new prescriptions provided to the patient are listed below.  Medications given during this visit Medications  sodium chloride 0.9 % bolus 500 mL (0 mLs Intravenous Stopped 05/04/14 0952)  morphine 2 MG/ML injection 2 mg (2 mg Intravenous Given 05/04/14 0854)  ondansetron (ZOFRAN) injection 4 mg (4 mg Intravenous Given 05/04/14 0854)  iohexol (OMNIPAQUE) 300 MG/ML solution 50 mL (50 mLs Oral Contrast Given 05/04/14 0835)   iohexol (OMNIPAQUE) 300 MG/ML solution 100 mL (100 mLs Intravenous Contrast Given 05/04/14 0937)  gi cocktail (Maalox,Lidocaine,Donnatal) (30 mLs Oral Given 05/04/14 1042)    Discharge Medication List as of 05/04/2014 11:28 AM    START taking these medications   Details  ondansetron (ZOFRAN ODT) 4 MG disintegrating tablet 4mg  ODT q4 hours prn nausea/vomit, Print         Pamella Pert, MD 05/04/14 1502

## 2014-05-04 NOTE — Discharge Instructions (Signed)
Abdominal Pain, Women °Abdominal (stomach, pelvic, or belly) pain can be caused by many things. It is important to tell your doctor: °· The location of the pain. °· Does it come and go or is it present all the time? °· Are there things that start the pain (eating certain foods, exercise)? °· Are there other symptoms associated with the pain (fever, nausea, vomiting, diarrhea)? °All of this is helpful to know when trying to find the cause of the pain. °CAUSES  °· Stomach: virus or bacteria infection, or ulcer. °· Intestine: appendicitis (inflamed appendix), regional ileitis (Crohn's disease), ulcerative colitis (inflamed colon), irritable bowel syndrome, diverticulitis (inflamed diverticulum of the colon), or cancer of the stomach or intestine. °· Gallbladder disease or stones in the gallbladder. °· Kidney disease, kidney stones, or infection. °· Pancreas infection or cancer. °· Fibromyalgia (pain disorder). °· Diseases of the female organs: °¨ Uterus: fibroid (non-cancerous) tumors or infection. °¨ Fallopian tubes: infection or tubal pregnancy. °¨ Ovary: cysts or tumors. °¨ Pelvic adhesions (scar tissue). °¨ Endometriosis (uterus lining tissue growing in the pelvis and on the pelvic organs). °¨ Pelvic congestion syndrome (female organs filling up with blood just before the menstrual period). °¨ Pain with the menstrual period. °¨ Pain with ovulation (producing an egg). °¨ Pain with an IUD (intrauterine device, birth control) in the uterus. °¨ Cancer of the female organs. °· Functional pain (pain not caused by a disease, may improve without treatment). °· Psychological pain. °· Depression. °DIAGNOSIS  °Your doctor will decide the seriousness of your pain by doing an examination. °· Blood tests. °· X-rays. °· Ultrasound. °· CT scan (computed tomography, special type of X-ray). °· MRI (magnetic resonance imaging). °· Cultures, for infection. °· Barium enema (dye inserted in the large intestine, to better view it with  X-rays). °· Colonoscopy (looking in intestine with a lighted tube). °· Laparoscopy (minor surgery, looking in abdomen with a lighted tube). °· Major abdominal exploratory surgery (looking in abdomen with a large incision). °TREATMENT  °The treatment will depend on the cause of the pain.  °· Many cases can be observed and treated at home. °· Over-the-counter medicines recommended by your caregiver. °· Prescription medicine. °· Antibiotics, for infection. °· Birth control pills, for painful periods or for ovulation pain. °· Hormone treatment, for endometriosis. °· Nerve blocking injections. °· Physical therapy. °· Antidepressants. °· Counseling with a psychologist or psychiatrist. °· Minor or major surgery. °HOME CARE INSTRUCTIONS  °· Do not take laxatives, unless directed by your caregiver. °· Take over-the-counter pain medicine only if ordered by your caregiver. Do not take aspirin because it can cause an upset stomach or bleeding. °· Try a clear liquid diet (broth or water) as ordered by your caregiver. Slowly move to a bland diet, as tolerated, if the pain is related to the stomach or intestine. °· Have a thermometer and take your temperature several times a day, and record it. °· Bed rest and sleep, if it helps the pain. °· Avoid sexual intercourse, if it causes pain. °· Avoid stressful situations. °· Keep your follow-up appointments and tests, as your caregiver orders. °· If the pain does not go away with medicine or surgery, you may try: °¨ Acupuncture. °¨ Relaxation exercises (yoga, meditation). °¨ Group therapy. °¨ Counseling. °SEEK MEDICAL CARE IF:  °· You notice certain foods cause stomach pain. °· Your home care treatment is not helping your pain. °· You need stronger pain medicine. °· You want your IUD removed. °· You feel faint or   lightheaded. °· You develop nausea and vomiting. °· You develop a rash. °· You are having side effects or an allergy to your medicine. °SEEK IMMEDIATE MEDICAL CARE IF:  °· Your  pain does not go away or gets worse. °· You have a fever. °· Your pain is felt only in portions of the abdomen. The right side could possibly be appendicitis. The left lower portion of the abdomen could be colitis or diverticulitis. °· You are passing blood in your stools (bright red or black tarry stools, with or without vomiting). °· You have blood in your urine. °· You develop chills, with or without a fever. °· You pass out. °MAKE SURE YOU:  °· Understand these instructions. °· Will watch your condition. °· Will get help right away if you are not doing well or get worse. °Document Released: 12/09/2006 Document Revised: 06/28/2013 Document Reviewed: 12/29/2008 °[employer]® Patient Information ©2015 [employer]. This information is not intended to replace advice given to you by your health care provider. Make sure you discuss any questions you have with your health care provider. ° °

## 2014-05-04 NOTE — ED Notes (Signed)
Patient has a known history of gall stones for several years.  States this morning around 0500, she developed upper abdominal pain with radiation into her left posterior rib.  States she feels like she needs to burp or pass gas.  Denies nausea or vomiting.

## 2014-05-04 NOTE — ED Notes (Signed)
Patient transported to CT 

## 2014-05-05 DIAGNOSIS — S13140A Subluxation of C3/C4 cervical vertebrae, initial encounter: Secondary | ICD-10-CM | POA: Diagnosis not present

## 2014-05-05 DIAGNOSIS — M5412 Radiculopathy, cervical region: Secondary | ICD-10-CM | POA: Diagnosis not present

## 2014-05-05 DIAGNOSIS — M9901 Segmental and somatic dysfunction of cervical region: Secondary | ICD-10-CM | POA: Diagnosis not present

## 2014-05-06 ENCOUNTER — Telehealth: Payer: Self-pay | Admitting: Neurology

## 2014-05-06 ENCOUNTER — Telehealth: Payer: Self-pay | Admitting: Gastroenterology

## 2014-05-06 NOTE — Telephone Encounter (Signed)
Left message to call back - made aware of office hours today.

## 2014-05-06 NOTE — Telephone Encounter (Signed)
Spoke with patient and she states she went to [facility] ED earlier this week for abdominal pain. (CT was negative) She states she is having abdominal pain below belly button and today she has had diarrhea. She took [employer] for this. Scheduled with Arta Bruce, PA-C on 05/09/14 at 1:15 PM. Patient understands to seek care at Urgent Care or ED if worsens over the weekend.

## 2014-05-06 NOTE — Telephone Encounter (Signed)
Spoke to patient who reports she is having headache today - does not feel different than others she has had in the past - BP elevated at 173/96 this am but is now back down to 127/72 - instructed her to take whole tablet of Tramadol for headache and may repeat if needed.  Also, instructed her to take tizanidine at qhs if needed.  I ask her to keep a record of her BP and if it continues to be unstable to call her PCP to address.

## 2014-05-06 NOTE — Telephone Encounter (Signed)
Pt would like a call back from the nurse regarding the ache and burning in her head.  She is not sure what to do.  She has taken her blood pressure and a half of tramadol.  Please call and advise.

## 2014-05-06 NOTE — Telephone Encounter (Signed)
Line busy - will try again later

## 2014-05-09 ENCOUNTER — Ambulatory Visit (INDEPENDENT_AMBULATORY_CARE_PROVIDER_SITE_OTHER): Payer: Medicare Other | Admitting: Physician Assistant

## 2014-05-09 ENCOUNTER — Encounter: Payer: Self-pay | Admitting: Physician Assistant

## 2014-05-09 VITALS — BP 100/66 | HR 72 | Ht 59.0 in | Wt 134.8 lb

## 2014-05-09 DIAGNOSIS — K5732 Diverticulitis of large intestine without perforation or abscess without bleeding: Secondary | ICD-10-CM

## 2014-05-09 DIAGNOSIS — I2510 Atherosclerotic heart disease of native coronary artery without angina pectoris: Secondary | ICD-10-CM | POA: Diagnosis not present

## 2014-05-09 MED ORDER — METRONIDAZOLE 250 MG PO TABS: 250.0000 mg | ORAL_TABLET | Freq: Three times a day (TID) | ORAL | Status: DC

## 2014-05-09 MED ORDER — HYOSCYAMINE SULFATE 0.125 MG SL SUBL: 0.1250 mg | SUBLINGUAL_TABLET | Freq: Three times a day (TID) | SUBLINGUAL | Status: DC | PRN

## 2014-05-09 MED ORDER — SULFAMETHOXAZOLE-TRIMETHOPRIM 400-80 MG PO TABS: 1.0000 | ORAL_TABLET | Freq: Two times a day (BID) | ORAL | Status: DC

## 2014-05-09 NOTE — Patient Instructions (Signed)
We have sent the following medications to your pharmacy for you to pick up at your convenience:  Bactrim, Flagyl, Hyoscamine  Eat a low residue diet for 7 days.  Please follow up with Cecille Rubin in 2-3 weeks

## 2014-05-09 NOTE — Progress Notes (Signed)
Reviewed and agree with management plan.  Amarilis Belflower T. Sweta Halseth, MD FACG 

## 2014-05-09 NOTE — Progress Notes (Signed)
Patient ID: [name], female   DOB: Nov 18, 1935, 79 y.o.   MRN: [id number]     History of Present Illness: [name] pleasant 79 year old female who has been followed in the past by Dr. Sharlett Iles and is currently Dr. Lynne Leader patient. She has a history of diverticulitis and has been treated on several occasions since 2013. Most recently she was seen in the office in December 2015 with left lower quadrant abdominal pain and had a CT that showed diverticulitis. She was treated with Flagyl and Cipro for 10 days and felt better. She states approximately 1 week ago she again began to have left lower quadrant abdominal pain that she describes as a constant ache her stools have been normal to soft. She was seen at the [facility] emergency room and had a CT and labs that were normal. She was given nausea meds. She used her nausea met once in her nausea has improved but her left lower quadrant pain has gotten worse. She states it comes and goes and spasms she is not having any diarrhea. She has no fever or chills but states she just doesn't feel well and feels very weak. She rates her pain as a 7 or 8 on a scale of 1-10.   Past Medical History  Diagnosis Date  . Diverticulosis of colon (without mention of hemorrhage)   . Irritable bowel syndrome   . Colonic polyp 04-27-2009    tubular adenoma  . Headache(784.0)     a. frequently assocaited with high BPs  . Breast cancer, Left 12/20/2010    NO BLOOD PRESSURE CHECKS OR STICKS IN LEFT ARM  . Gallstones   . CAD (coronary artery disease)     a. 01/19/2010 s/p CABG x 3, lima->lad, vg->diag, vg->om1;  b. 06/2011 :Lexi MV: EF 84%, No ischemia. c. 01/06/14 s/p negative nuclear stress test with EF >70%  . Fibromyalgia   . Atrophic vaginitis   . GERD (gastroesophageal reflux disease)   . Hypercholesterolemia   . Glaucoma   . Cervical arthritis   . Hiatal hernia   . Labile hypertension   . Asthma     Past Surgical History    Procedure Laterality Date  . Coronary angioplasty with stent placement      Stent 2007  . Open heart surgery      01/19/2010  . Coronary artery bypass graft    . Cataract extraction, bilateral      bilateral caaract removal,  . Breast lumpectomy Left 02/06/11   Family History  Problem Relation Age of Onset  . Heart disease Brother   . Cancer Brother     gland cancer  . Diabetes Sister   . Breast cancer Sister   . Stroke Mother   . Stroke Father   . Colon cancer Neg Hx   . Stomach cancer Neg Hx   . Pancreatic cancer Neg Hx   . Prostate cancer Father   . Kidney disease Neg Hx   . Liver disease Neg Hx    History  Substance Use Topics  . Smoking status: Never Smoker   . Smokeless tobacco: Never Used  . Alcohol Use: No   Current Outpatient Prescriptions  Medication Sig Dispense Refill  . aspirin EC 81 MG tablet Take 81 mg by mouth daily.    . cyclobenzaprine (FLEXERIL) 5 MG tablet take 1/2 to 1 tablet by mouth twice a day if needed 60 tablet 5  . fexofenadine (ALLEGRA) 180 MG tablet Take  180 mg by mouth daily.      . fluocinonide (LIDEX) 0.05 % external solution Apply 1 application topically daily as needed (scalp itching).     . Glucosamine-Chondroit-Vit C-Mn (GLUCOSAMINE CHONDR 1500 COMPLX PO) Take 1 tablet by mouth 3 (three) times daily.      . hydrALAZINE (APRESOLINE) 50 MG tablet Take 1/2 tablet by mouth three times daily    . hyoscyamine (ANASPAZ) 0.125 MG TBDP disintergrating tablet Place 0.125 mg under the tongue every 6 (six) hours as needed for bladder spasms or cramping.    [name] ketoconazole (NIZORAL) 2 % shampoo Apply 1 application topically 2 (two) times a week. As needed for itching a couple of times week    . latanoprost (XALATAN) 0.005 % ophthalmic solution Place 1 drop into both eyes at bedtime.     [name] letrozole (FEMARA) 2.5 MG tablet Take 1 tablet (2.5 mg total) by mouth daily. 90 tablet 3  . losartan (COZAAR) 100 MG tablet Take 1/2 tablet by mouth three times  daily    . magnesium oxide (MAG-OX) 400 MG tablet Take 400 mg by mouth daily.    . metoprolol (LOPRESSOR) 50 MG tablet Take 1 tablet (50 mg total) by mouth 3 (three) times daily. 90 tablet 1  . mometasone (NASONEX) 50 MCG/ACT nasal spray Place 2 sprays into the nose daily as needed (congestion).     . nitroGLYCERIN (NITROSTAT) 0.4 MG SL tablet Place 1 tablet (0.4 mg total) under the tongue every 5 (five) minutes as needed for chest pain. 25 tablet 3  . Omega-3 Fatty Acids (FISH OIL) 1200 MG CAPS Take 1,200 mg by mouth daily.     [name] omeprazole (PRILOSEC) 40 MG capsule Take 1 capsule (40 mg total) by mouth daily. 30 minutes before 1st meal of the day 30 capsule 6  . PRESCRIPTION MEDICATION Vitamin B 2    . tiZANidine (ZANAFLEX) 2 MG tablet Take 2 mg by mouth at bedtime.    . traMADol (ULTRAM) 50 MG tablet Take 1 tablet (50 mg total) by mouth 3 (three) times daily as needed. 90 tablet 5  . vitamin B-12 (CYANOCOBALAMIN) 1000 MCG tablet Take 1,000 mcg by mouth daily.      . hyoscyamine (LEVSIN SL) 0.125 MG SL tablet Place 1 tablet (0.125 mg total) under the tongue 3 (three) times daily as needed. 60 tablet 0  . metroNIDAZOLE (FLAGYL) 250 MG tablet Take 1 tablet (250 mg total) by mouth 3 (three) times daily. 21 tablet 0  . sulfamethoxazole-trimethoprim (BACTRIM,SEPTRA) 400-80 MG per tablet Take 1 tablet by mouth 2 (two) times daily. 14 tablet 0   No current facility-administered medications for this visit.   Allergies  Allergen Reactions  . Choline Fenofibrate Other (See Comments)     pt states INTOL to Trilipix w/ "thigh burning"  . Simvastatin Other (See Comments)     pt states INTOL to STATINS \T\ refuses to restart  . Hctz [Hydrochlorothiazide]     Causes hyponatremia  . Hydrocodone     Nightmare after taking cough syrup w/hydrocodone  . Levaquin [Levofloxacin In D5w]     Elevated BP  . Adhesive [Tape] Rash  . Ceclor [Cefaclor] Rash  . Clarithromycin Rash  . Codeine Nausea Only  .  Doxycycline Rash  . Lisinopril Cough    Developed ACE cough...  . Penicillins Itching and Rash    At injection site  . Tobramycin-Dexamethasone Rash      Review of Systems: Gen: Denies any fever, chills,  sweats, anorexia, fatigue, weakness, malaise, weight loss, and sleep disorder CV: Denies chest pain, angina, palpitations, syncope, orthopnea, PND, peripheral edema, and claudication. Resp: Denies dyspnea at rest, dyspnea with exercise, cough, sputum, wheezing, coughing up blood, and pleurisy. GI: Denies vomiting blood, jaundice, and fecal incontinence.   Denies dysphagia or odynophagia. GU : Denies urinary burning, blood in urine, urinary frequency, urinary hesitancy, nocturnal urination, and urinary incontinence. MS: Denies joint pain, limitation of movement, and swelling, stiffness, low back pain, extremity pain. Denies muscle weakness, cramps, atrophy.  Derm: Denies rash, itching, dry skin, hives, moles, warts, or unhealing ulcers.  Psych: Denies depression, anxiety, memory loss, suicidal ideation, hallucinations, paranoia, and confusion. Heme: Denies bruising, bleeding, and enlarged lymph nodes. Neuro:  Denies any headaches, dizziness, paresthesia Endo:  Denies any problems with DM, thyroid, adrenal  LAB RESULTS: CBC on 05/04/2014 had a white blood cell count 6.2, hemoglobin 13.7, hematocrit 40.1, platelets were in clumps.  Studies:   Dg Chest 2 View  05/04/2014   CLINICAL DATA:  History of left breast cancer.  Epigastric pain.  EXAM: CHEST  2 VIEW  COMPARISON:  PA and lateral chest 01/05/2014.  FINDINGS: The patient is status post CABG. The lungs are clear. Heart size is normal. No pneumothorax or pleural effusion. Thoracic spondylosis is noted.  IMPRESSION: No acute disease.   Electronically Signed   By: Inge Rise M.D.   On: 05/04/2014 10:04   Ct Abdomen Pelvis W Contrast  05/04/2014   CLINICAL DATA:  Mid and upper abdominal pain beginning at 5 a.m. today. History of  gallstones.  EXAM: CT ABDOMEN AND PELVIS WITH CONTRAST  TECHNIQUE: Multidetector CT imaging of the abdomen and pelvis was performed using the standard protocol following bolus administration of intravenous contrast.  CONTRAST:  50 mL OMNIPAQUE IOHEXOL 300 MG/ML SOLN, 100 mL OMNIPAQUE IOHEXOL 300 MG/ML SOLN  COMPARISON:  CT abdomen and pelvis 02/11/2014 11/07/2011.  FINDINGS: Mild dependent atelectasis is seen in the lung bases. Heart size is upper normal. No pleural or pericardial effusion.  The liver is diffusely low attenuating consistent with fatty infiltration. No focal liver lesion is identified. The gallbladder, adrenal glands, kidneys and pancreas are unremarkable. A 0.4 cm hypo attenuating lesion in the spleen is likely a cyst and unchanged.  Uterus, adnexa and urinary bladder appear normal. The patient has fairly extensive sigmoid diverticulosis without evidence of diverticulitis. The colon is otherwise normal in appearance. The stomach and small bowel appear normal. The appendix is not visualized and may have been removed. No inflammatory process is seen. No lymphadenopathy or fluid.  No lytic or sclerotic bony lesion is identified. Lumbar spondylosis is unchanged in appearance.  IMPRESSION: No acute finding abdomen or pelvis.  Sigmoid diverticulosis without diverticulitis.  Fatty infiltration of the liver.   Electronically Signed   By: Inge Rise M.D.   On: 05/04/2014 09:59     Physical Exam: General: Pleasant, well developed ,female who appears uncomfortable Head: Normocephalic and atraumatic Eyes:  sclerae anicteric, conjunctiva pink  Ears: Normal auditory acuity Lungs: Clear throughout to auscultation Heart: Regular rate and rhythm Abdomen: Soft, non distended, tender LLQ with guarding. No masses, no hepatomegaly. Normal bowel sounds Musculoskeletal: Symmetrical with no gross deformities  Extremities: No edema  Neurological: Alert oriented x 4, grossly nonfocal Psychological:   Alert and cooperative. Normal mood and affect  Assessment and Recommendations:  79 year old female with a known history of diverticulosis status post several bouts of diverticulitis here with a one-week history of left  lower quadrant abdominal pain that has been increasing in severity. CT scan and CBC at the onset of her symptoms done at [facility] revealed diverticulosis with no diverticulitis and a normal white count. However, due to his progression of her symptoms we will treat her as diverticulitis. Patient states she is very sensitive to medications and has multiple antibiotic allergies. She has recently been started on flecainide and so we will avoid possible interactions with a quinolone. She will be given a trial of Flagyl 250 mg 3 times a day for 7 days along with Bactrim 400/80  1 by mouth twice a day for 7 days. She will also try hyoscyamine 0.125 mg 1 by mouth 3 times a day when necessary spasm. She is to adhere to a low residue diet. She will follow up in 2 weeks, sooner if needed.    Damonie Furney, Deloris Ping 05/09/2014,

## 2014-05-11 ENCOUNTER — Telehealth: Payer: Self-pay | Admitting: Neurology

## 2014-05-11 ENCOUNTER — Ambulatory Visit: Payer: Medicare Other | Admitting: Neurology

## 2014-05-11 DIAGNOSIS — G4733 Obstructive sleep apnea (adult) (pediatric): Secondary | ICD-10-CM

## 2014-05-11 DIAGNOSIS — J3089 Other allergic rhinitis: Secondary | ICD-10-CM | POA: Diagnosis not present

## 2014-05-11 DIAGNOSIS — J301 Allergic rhinitis due to pollen: Secondary | ICD-10-CM | POA: Diagnosis not present

## 2014-05-11 NOTE — Telephone Encounter (Signed)
Please call and notify patient that the recent sleep study confirmed the diagnosis of OSA. She did well with CPAP during the study with significant improvement of the respiratory events. Therefore, I would like start the patient on CPAP at home. I placed the order in the chart.   Arrange for CPAP set up at home through a DME company of patient's choice and fax/route report to PCP and referring MD (if other than PCP).   The patient will also need a follow up appointment with me in 6-8 weeks post set up that has to be scheduled; help the patient schedule this (in a follow-up slot).   Please re-enforce the importance of compliance with treatment and the need for [place] to monitor compliance data.   Once you have spoken to the patient and scheduled the return appointment, you may close this encounter, thanks,   Star Age, MD, PhD [facility] ([place])

## 2014-05-12 ENCOUNTER — Encounter: Payer: Self-pay | Admitting: Neurology

## 2014-05-12 ENCOUNTER — Ambulatory Visit (INDEPENDENT_AMBULATORY_CARE_PROVIDER_SITE_OTHER): Payer: Medicare Other | Admitting: Neurology

## 2014-05-12 ENCOUNTER — Encounter: Payer: Self-pay | Admitting: *Deleted

## 2014-05-12 VITALS — BP 183/83 | HR 60 | Ht 59.0 in | Wt 134.0 lb

## 2014-05-12 DIAGNOSIS — I2510 Atherosclerotic heart disease of native coronary artery without angina pectoris: Secondary | ICD-10-CM

## 2014-05-12 DIAGNOSIS — G4733 Obstructive sleep apnea (adult) (pediatric): Secondary | ICD-10-CM | POA: Diagnosis not present

## 2014-05-12 DIAGNOSIS — R51 Headache: Secondary | ICD-10-CM

## 2014-05-12 DIAGNOSIS — R519 Headache, unspecified: Secondary | ICD-10-CM

## 2014-05-12 MED ORDER — NORTRIPTYLINE HCL 25 MG PO CAPS
ORAL_CAPSULE | ORAL | Status: DC
Start: 2014-05-12 — End: 2014-05-12

## 2014-05-12 NOTE — Progress Notes (Signed)
[facility]  PATIENT: [name] DOB: Mar 22, 1935   HISTORY OF PRESENT ILLNESS:[name], 79 year old female returns for followup.   HISTORY: Evaluation of headaches initial visit Sept 2014   She had a past medical history of coronary artery disease, status post  stent, hypertension, hyperlipidemia, she is taking beta blocker metoprolol 50 mg twice a day. she also has a history of breast cancer, status post left lobectomy followed by chemotherapy and radiation therapy in 2012   She reported a history of migraine headaches since age 55s, her typical migraine are occipital area bandlike, severe pounding headache with associated light noise sensitivity, clustered around her menstruation period of time, her headache has quieted down for many years, until about 2014, she began to experience frequent headaches again, somewhat similar to the headache she had previously, bandlike starting from occipital,to bilateral temporal frontal region, pressure, with associated light noise sensitivity.   She reported 15 days out of a month, she would have moderate degree of headaches   She has tried ice and heating pad, which helped her headache some, she is also taking Percocet, Zofran as needed for nausea.  She had CAT scan of the brain that was normal, MRI of cervical spine showed multilevel degenerative disc disease, mild right foraminal stenosis at C3-4, secondary to asymmetric right-sided facet hypertrophy, and joint disease,  She denies jaw claudication, no gait difficulty no diffuse muscle achy pain, no chewing or swallowing difficulties   Previous ESR, was normal, only mild elevated C-reactive protein  UPDATE 01/18/2014:  She was admitted to hospital in October 2015 for acute bronchitis, was treated with antibiotics, later was readmitted to the hospital for hyponatremia as low as 118, most recent sodium was 130 in January 24 2014,  CT head without contrast, showed mild  atrophy, small vessel disease, no acute lesions.  Echocardiogram was essentially normal.  Over the past 2 months, she began to have frequent headaches again, often starting from occipital area, spreading forward, pressure and burning sensation, lasting 30 minutes, resting was helpful   Ultram as needed for headaches only provides mild help  She was also noted to have elevated blood pressure, to 200, is on 3 agent treatment, hydralazine 50 mg 3 times a day, metoprolol 50 mg 3 times a day, losartan 50 mg twice a day.  UPDATE Jan 14th 2016: She continue have frequent early morning headaches, bandlike sensation around her vertex region, 5 out of 10, sometimes can go up to 10 out of 10, she is taking tramadol, Tylenol PM as needed she also complains of frequent awakening at nighttime,  nocturia dry mouth, snoring, ESS score is 42, F S S score is 31   UPDATE March 17th 2016: She has vertex pressure headache, starting at occipital region, at least 3 times a day, she is emotional, upset Sleep study confirmed OSA, she has not got her CPAP machine yet,  REVIEW OF SYSTEMS: Full 14 system review of systems performed and notable only for those listed, all others are neg: Abdominal pain, dizziness, headaches, Dizziness.   ALLERGIES: Allergies  Allergen Reactions  . Choline Fenofibrate Other (See Comments)     pt states INTOL to Trilipix w/ "thigh burning"  . Simvastatin Other (See Comments)     pt states INTOL to STATINS \\T \ refuses to restart  . Hctz [Hydrochlorothiazide]     Causes hyponatremia  . Hydrocodone     Nightmare after taking cough syrup w/hydrocodone  . Levaquin [Levofloxacin In D5w]  Elevated BP  . Adhesive [Tape] Rash  . Ceclor [Cefaclor] Rash  . Clarithromycin Rash  . Codeine Nausea Only  . Doxycycline Rash  . Lisinopril Cough    Developed ACE cough...  . Penicillins Itching and Rash    At injection site  . Tobramycin-Dexamethasone Rash    HOME  MEDICATIONS: Outpatient Prescriptions Prior to Visit  Medication Sig Dispense Refill  . aspirin EC 81 MG tablet Take 81 mg by mouth daily.    . fexofenadine (ALLEGRA) 180 MG tablet Take 180 mg by mouth daily.      . fluocinonide (LIDEX) 0.05 % external solution Apply 1 application topically daily as needed (scalp itching).     . Glucosamine-Chondroit-Vit C-Mn (GLUCOSAMINE CHONDR 1500 COMPLX PO) Take 1 tablet by mouth 3 (three) times daily.      . hydrALAZINE (APRESOLINE) 50 MG tablet Take 1/2 tablet by mouth three times daily    . hyoscyamine (ANASPAZ) 0.125 MG TBDP disintergrating tablet Place 0.125 mg under the tongue every 6 (six) hours as needed for bladder spasms or cramping.    [name] ketoconazole (NIZORAL) 2 % shampoo Apply 1 application topically 2 (two) times a week. As needed for itching a couple of times week    . latanoprost (XALATAN) 0.005 % ophthalmic solution Place 1 drop into both eyes at bedtime.     [name] letrozole (FEMARA) 2.5 MG tablet Take 1 tablet (2.5 mg total) by mouth daily. 90 tablet 3  . losartan (COZAAR) 100 MG tablet Take 1/2 tablet by mouth three times daily    . magnesium oxide (MAG-OX) 400 MG tablet Take 400 mg by mouth daily.    . metoprolol (LOPRESSOR) 50 MG tablet Take 1 tablet (50 mg total) by mouth 3 (three) times daily. 90 tablet 1  . metroNIDAZOLE (FLAGYL) 250 MG tablet Take 1 tablet (250 mg total) by mouth 3 (three) times daily. 21 tablet 0  . mometasone (NASONEX) 50 MCG/ACT nasal spray Place 2 sprays into the nose daily as needed (congestion).     . nitroGLYCERIN (NITROSTAT) 0.4 MG SL tablet Place 1 tablet (0.4 mg total) under the tongue every 5 (five) minutes as needed for chest pain. 25 tablet 3  . Omega-3 Fatty Acids (FISH OIL) 1200 MG CAPS Take 1,200 mg by mouth daily.     [name] omeprazole (PRILOSEC) 40 MG capsule Take 1 capsule (40 mg total) by mouth daily. 30 minutes before 1st meal of the day 30 capsule 6  . PRESCRIPTION MEDICATION Vitamin B 2    .  sulfamethoxazole-trimethoprim (BACTRIM,SEPTRA) 400-80 MG per tablet Take 1 tablet by mouth 2 (two) times daily. 14 tablet 0  . tiZANidine (ZANAFLEX) 2 MG tablet Take 2 mg by mouth at bedtime.    . traMADol (ULTRAM) 50 MG tablet Take 1 tablet (50 mg total) by mouth 3 (three) times daily as needed. 90 tablet 5  . vitamin B-12 (CYANOCOBALAMIN) 1000 MCG tablet Take 1,000 mcg by mouth daily.      . cyclobenzaprine (FLEXERIL) 5 MG tablet take 1/2 to 1 tablet by mouth twice a day if needed 60 tablet 5  . hyoscyamine (LEVSIN SL) 0.125 MG SL tablet Place 1 tablet (0.125 mg total) under the tongue 3 (three) times daily as needed. 60 tablet 0   No facility-administered medications prior to visit.    PAST MEDICAL HISTORY: Past Medical History  Diagnosis Date  . Diverticulosis of colon (without mention of hemorrhage)   . Irritable bowel syndrome   . Colonic  polyp 04-27-2009    tubular adenoma  . Headache(784.0)     a. frequently assocaited with high BPs  . Breast cancer, Left 12/20/2010    NO BLOOD PRESSURE CHECKS OR STICKS IN LEFT ARM  . Gallstones   . CAD (coronary artery disease)     a. 01/19/2010 s/p CABG x 3, lima->lad, vg->diag, vg->om1;  b. 06/2011 :Lexi MV: EF 84%, No ischemia. c. 01/06/14 s/p negative nuclear stress test with EF >70%  . Fibromyalgia   . Atrophic vaginitis   . GERD (gastroesophageal reflux disease)   . Hypercholesterolemia   . Glaucoma   . Cervical arthritis   . Hiatal hernia   . Labile hypertension   . Asthma     PAST SURGICAL HISTORY: Past Surgical History  Procedure Laterality Date  . Coronary angioplasty with stent placement      Stent 2007  . Open heart surgery      01/19/2010  . Coronary artery bypass graft    . Cataract extraction, bilateral      bilateral caaract removal,  . Breast lumpectomy Left 02/06/11    FAMILY HISTORY: Family History  Problem Relation Age of Onset  . Heart disease Brother   . Cancer Brother     gland cancer  . Diabetes  Sister   . Breast cancer Sister   . Stroke Mother   . Stroke Father   . Colon cancer Neg Hx   . Stomach cancer Neg Hx   . Pancreatic cancer Neg Hx   . Prostate cancer Father   . Kidney disease Neg Hx   . Liver disease Neg Hx     SOCIAL HISTORY: History   Social History  . Marital Status: Married    Spouse Name: Gildardo Griffes. [place]  . Number of Children: 2  . Years of Education: 12   Occupational History  . part time preschool teacher-retired    Social History Main Topics  . Smoking status: Never Smoker   . Smokeless tobacco: Never Used  . Alcohol Use: No  . Drug Use: No  . Sexual Activity: Yes    Birth Control/ Protection: Post-menopausal   Other Topics Concern  . Not on file   Social History Narrative   Patient lives at home with her husband Marcello Moores). Patient is retired. Patient  Has 12 th grade education.    Caffeine- sometimes- One cup of coffee.   Right handed.   Four granddaughters.     PHYSICAL EXAM  Filed Vitals:   05/12/14 1136  BP: 183/83  Pulse: 60  Height: $Remove'4\' 11"'CWgbSFp$  (1.499 m)  Weight: 134 lb (60.782 kg)   Body mass index is 27.05 kg/(m^2). PHYSICAL EXAMNIATION:  Gen: NAD, conversant, well nourised, obese, well groomed                     Cardiovascular: Regular rate rhythm, no peripheral edema, warm, nontender. Eyes: Conjunctivae clear without exudates or hemorrhage Neck: Supple, no carotid bruise. Pulmonary: Clear to auscultation bilaterally   NEUROLOGICAL EXAM:  MENTAL STATUS: Speech:    Speech is normal; fluent and spontaneous with normal comprehension.  Cognition:    The patient is oriented to person, place, and time;     recent and remote memory intact;     language fluent;     normal attention, concentration,     fund of knowledge.  CRANIAL NERVES: CN II: Visual fields are full to confrontation. Fundoscopic exam is normal with sharp discs and no vascular changes.  Venous pulsations are present bilaterally. Pupils are 4 mm and  briskly reactive to light. Visual acuity is 20/20 bilaterally. CN III, IV, VI: extraocular movement are normal. No ptosis. CN V: Facial sensation is intact to pinprick in all 3 divisions bilaterally. Corneal responses are intact.  CN VII: Face is symmetric with normal eye closure and smile. CN VIII: Hearing is normal to rubbing fingers CN IX, X: Palate elevates symmetrically. Phonation is normal. CN XI: Head turning and shoulder shrug are intact CN XII: Tongue is midline with normal movements and no atrophy. Small oropharyngeal.  MOTOR: There is no pronator drift of out-stretched arms. Muscle bulk and tone are normal. Muscle strength is normal.   Shoulder abduction Shoulder external rotation Elbow flexion Elbow extension Wrist flexion Wrist extension Finger abduction Hip flexion Knee flexion Knee extension Ankle dorsi flexion Ankle plantar flexion  R $'5 5 5 5 5 5 5 5 5 5 5 5  'q$ L '5 5 5 5 5 5 5 5 5 5 5 5    '$ REFLEXES: Reflexes are 2+ and symmetric at the biceps, triceps, knees, and ankles. Plantar responses are flexor.  SENSORY: Light touch, pinprick, position sense, and vibration sense are intact in fingers and toes.  COORDINATION: Rapid alternating movements and fine finger movements are intact. There is no dysmetria on finger-to-nose and heel-knee-shin. There are no abnormal or extraneous movements.   GAIT/STANCE: Posture is normal. Gait is steady with normal steps, base, arm swing, and turning. Heel and toe walking are normal. Tandem gait is normal.  Romberg is absent.    DIAGNOSTIC DATA (LABS, IMAGING, TESTING) - I reviewed patient records, labs, notes, testing and imaging myself where available.  Lab Results  Component Value Date   WBC 6.2 05/04/2014   HGB 13.7 05/04/2014   HCT 40.1 05/04/2014   MCV 90.9 05/04/2014   PLT PLATELET CLUMPS NOTED ON SMEAR, UNABLE TO ESTIMATE 05/04/2014      Component Value Date/Time   NA 129* 05/04/2014 0850   NA 132* 07/27/2013 1008   K  3.8 05/04/2014 0850   K 4.6 07/27/2013 1008   CL 97 05/04/2014 0850   CL 95* 07/28/2012 0941   CO2 26 05/04/2014 0850   CO2 23 07/27/2013 1008   GLUCOSE 93 05/04/2014 0850   GLUCOSE 88 07/27/2013 1008   GLUCOSE 108* 07/28/2012 0941   BUN 13 05/04/2014 0850   BUN 12.7 07/27/2013 1008   CREATININE 0.59 05/04/2014 0850   CREATININE 0.7 07/27/2013 1008   CALCIUM 9.0 05/04/2014 0850   CALCIUM 9.5 07/27/2013 1008   PROT 7.8 05/04/2014 0850   PROT 7.1 07/27/2013 1008   ALBUMIN 4.5 05/04/2014 0850   ALBUMIN 4.0 07/27/2013 1008   AST 26 05/04/2014 0850   AST 22 07/27/2013 1008   ALT 22 05/04/2014 0850   ALT 23 07/27/2013 1008   ALKPHOS 81 05/04/2014 0850   ALKPHOS 82 07/27/2013 1008   BILITOT 1.1 05/04/2014 0850   BILITOT 1.25* 07/27/2013 1008   GFRNONAA 87* 05/04/2014 0850   GFRAA >90 05/04/2014 0850    ASSESSMENT AND PLAN  79 y.o. year old female  with past medical history of hypertension, migraine headaches, presenting with frequent headaches over the past 2 months, in the setting of acute bronchitis, hyponatremia, elevated blood pressure, her headache most consistent with tension headaches, with mild migraine features, she also complains of excessive daytime sleepiness, ESS score was 14, FSS score was 26, she has very narrow oropharyngeal, nighttime frequent awakening, early morning headaches,  sleep study has confirmed obstructive sleep apnea,  1 she continue complains of frequent headaches, likely will improved after CPAP machine, 2. Continue moderate exercise, will not start any new medication at this point  No orders of the defined types were placed in this encounter.    New Prescriptions   No medications on file    Return in about 2 months (around 07/12/2014).Marcial Pacas, M.D. Ph.D.  [facility] [place], [place] [address] Phone: [phone] Fax:      [phone]

## 2014-05-12 NOTE — Telephone Encounter (Signed)
Patient was contacted and provided the results of her sleep study that did reveal moderate sleep apnea.  The patient was advised that CPAP therapy was effective in treatment and it had been recommended for home use.  The patient agreed to give the therapy a try and was referred to [facility] for set up.  Dr. Krista Blue was routed a copy of the results.  The patient gave verbal permission to mail a copy of her test results.   Patient instructed to contact our office 6-8 weeks post set up to schedule a follow up appointment.

## 2014-05-16 ENCOUNTER — Emergency Department (HOSPITAL_BASED_OUTPATIENT_CLINIC_OR_DEPARTMENT_OTHER)
Admission: EM | Admit: 2014-05-16 | Discharge: 2014-05-16 | Disposition: A | Discharge status: Home / Self Care | Payer: Medicare Other | Attending: Emergency Medicine | Admitting: Emergency Medicine

## 2014-05-16 ENCOUNTER — Encounter (HOSPITAL_BASED_OUTPATIENT_CLINIC_OR_DEPARTMENT_OTHER): Payer: Self-pay | Admitting: *Deleted

## 2014-05-16 DIAGNOSIS — Z8742 Personal history of other diseases of the female genital tract: Secondary | ICD-10-CM | POA: Insufficient documentation

## 2014-05-16 DIAGNOSIS — Z853 Personal history of malignant neoplasm of breast: Secondary | ICD-10-CM | POA: Insufficient documentation

## 2014-05-16 DIAGNOSIS — Z792 Long term (current) use of antibiotics: Secondary | ICD-10-CM | POA: Insufficient documentation

## 2014-05-16 DIAGNOSIS — Z7982 Long term (current) use of aspirin: Secondary | ICD-10-CM | POA: Insufficient documentation

## 2014-05-16 DIAGNOSIS — Z8669 Personal history of other diseases of the nervous system and sense organs: Secondary | ICD-10-CM | POA: Diagnosis not present

## 2014-05-16 DIAGNOSIS — Z86010 Personal history of colonic polyps: Secondary | ICD-10-CM | POA: Diagnosis not present

## 2014-05-16 DIAGNOSIS — Z951 Presence of aortocoronary bypass graft: Secondary | ICD-10-CM | POA: Insufficient documentation

## 2014-05-16 DIAGNOSIS — R682 Dry mouth, unspecified: Secondary | ICD-10-CM

## 2014-05-16 DIAGNOSIS — K219 Gastro-esophageal reflux disease without esophagitis: Secondary | ICD-10-CM | POA: Diagnosis not present

## 2014-05-16 DIAGNOSIS — I2510 Atherosclerotic heart disease of native coronary artery without angina pectoris: Secondary | ICD-10-CM | POA: Diagnosis not present

## 2014-05-16 DIAGNOSIS — Z79899 Other long term (current) drug therapy: Secondary | ICD-10-CM | POA: Insufficient documentation

## 2014-05-16 DIAGNOSIS — R002 Palpitations: Secondary | ICD-10-CM | POA: Insufficient documentation

## 2014-05-16 DIAGNOSIS — J45909 Unspecified asthma, uncomplicated: Secondary | ICD-10-CM | POA: Diagnosis not present

## 2014-05-16 DIAGNOSIS — Z880 Allergy status to penicillin: Secondary | ICD-10-CM | POA: Diagnosis not present

## 2014-05-16 DIAGNOSIS — Z9861 Coronary angioplasty status: Secondary | ICD-10-CM | POA: Insufficient documentation

## 2014-05-16 LAB — CBC WITH DIFFERENTIAL/PLATELET
BASOS ABS: 0.1 10*3/uL (ref 0.0–0.1)
BASOS PCT: 1 % (ref 0–1)
Eosinophils Absolute: 0.2 10*3/uL (ref 0.0–0.7)
Eosinophils Relative: 3 % (ref 0–5)
HCT: 37.7 % (ref 36.0–46.0)
HEMOGLOBIN: 12.7 g/dL (ref 12.0–15.0)
Lymphocytes Relative: 43 % (ref 12–46)
Lymphs Abs: 2.7 10*3/uL (ref 0.7–4.0)
MCH: 31.1 pg (ref 26.0–34.0)
MCHC: 33.7 g/dL (ref 30.0–36.0)
MCV: 92.2 fL (ref 78.0–100.0)
MONOS PCT: 9 % (ref 3–12)
Monocytes Absolute: 0.6 10*3/uL (ref 0.1–1.0)
NEUTROS ABS: 2.8 10*3/uL (ref 1.7–7.7)
Neutrophils Relative %: 44 % (ref 43–77)
Platelets: 240 10*3/uL (ref 150–400)
RBC: 4.09 MIL/uL (ref 3.87–5.11)
RDW: 14 % (ref 11.5–15.5)
WBC: 6.3 10*3/uL (ref 4.0–10.5)

## 2014-05-16 LAB — URINALYSIS, ROUTINE W REFLEX MICROSCOPIC
BILIRUBIN URINE: NEGATIVE
GLUCOSE, UA: NEGATIVE mg/dL
HGB URINE DIPSTICK: NEGATIVE
Ketones, ur: NEGATIVE mg/dL
Nitrite: NEGATIVE
Protein, ur: NEGATIVE mg/dL
SPECIFIC GRAVITY, URINE: 1.01 (ref 1.005–1.030)
Urobilinogen, UA: 0.2 mg/dL (ref 0.0–1.0)
pH: 7 (ref 5.0–8.0)

## 2014-05-16 LAB — URINE MICROSCOPIC-ADD ON

## 2014-05-16 LAB — BASIC METABOLIC PANEL
ANION GAP: 8 (ref 5–15)
BUN: 13 mg/dL (ref 6–23)
CALCIUM: 9.2 mg/dL (ref 8.4–10.5)
CO2: 30 mmol/L (ref 19–32)
CREATININE: 0.71 mg/dL (ref 0.50–1.10)
Chloride: 95 mmol/L — ABNORMAL LOW (ref 96–112)
GFR, EST NON AFRICAN AMERICAN: 82 mL/min — AB (ref >90)
Glucose, Bld: 106 mg/dL — ABNORMAL HIGH (ref 70–99)
Potassium: 4.3 mmol/L (ref 3.5–5.1)
Sodium: 133 mmol/L — ABNORMAL LOW (ref 135–145)

## 2014-05-16 MED ORDER — SODIUM CHLORIDE 0.9 % IV BOLUS (SEPSIS)
1000.0000 mL | Freq: Once | INTRAVENOUS | Status: AC
  Administered 2014-05-16: 1000 mL via INTRAVENOUS

## 2014-05-16 NOTE — Discharge Instructions (Signed)

## 2014-05-16 NOTE — ED Provider Notes (Signed)
CSN: [id number]     Arrival date & time 05/16/14  0033 History   First MD Initiated Contact with Patient 05/16/14 0217     Chief Complaint  Patient presents with  . Diarrhea  . Tachycardia     (Consider location/radiation/quality/duration/timing/severity/associated sxs/prior Treatment) HPI Comments: Patient is a 79 year old female with extensive past medical history. She presents for evaluation of dry mouth and palpitations that woke her from sleep this evening. She's been taking Bactrim and Flagyl for diverticulitis. She denies any specific pain. She denies having any recent diarrhea states that her appetite has been good. She denies any excessive thirst or frequent urination.  The history is provided by the patient.    Past Medical History  Diagnosis Date  . Diverticulosis of colon (without mention of hemorrhage)   . Irritable bowel syndrome   . Colonic polyp 04-27-2009    tubular adenoma  . Headache(784.0)     a. frequently assocaited with high BPs  . Breast cancer, Left 12/20/2010    NO BLOOD PRESSURE CHECKS OR STICKS IN LEFT ARM  . Gallstones   . CAD (coronary artery disease)     a. 01/19/2010 s/p CABG x 3, lima->lad, vg->diag, vg->om1;  b. 06/2011 :Lexi MV: EF 84%, No ischemia. c. 01/06/14 s/p negative nuclear stress test with EF >70%  . Fibromyalgia   . Atrophic vaginitis   . GERD (gastroesophageal reflux disease)   . Hypercholesterolemia   . Glaucoma   . Cervical arthritis   . Hiatal hernia   . Labile hypertension   . Asthma    Past Surgical History  Procedure Laterality Date  . Coronary angioplasty with stent placement      Stent 2007  . Open heart surgery      01/19/2010  . Coronary artery bypass graft    . Cataract extraction, bilateral      bilateral caaract removal,  . Breast lumpectomy Left 02/06/11   Family History  Problem Relation Age of Onset  . Heart disease Brother   . Cancer Brother     gland cancer  . Diabetes Sister   . Breast cancer Sister    . Stroke Mother   . Stroke Father   . Colon cancer Neg Hx   . Stomach cancer Neg Hx   . Pancreatic cancer Neg Hx   . Prostate cancer Father   . Kidney disease Neg Hx   . Liver disease Neg Hx    History  Substance Use Topics  . Smoking status: Never Smoker   . Smokeless tobacco: Never Used  . Alcohol Use: No   OB History    Gravida Para Term Preterm AB TAB SAB Ectopic Multiple Living   3 2   1     2      Review of Systems  All other systems reviewed and are negative.     Allergies  Choline fenofibrate; Simvastatin; Hctz; Hydrocodone; Levaquin; Adhesive; Ceclor; Clarithromycin; Codeine; Doxycycline; Lisinopril; Penicillins; and Tobramycin-dexamethasone  Home Medications   Prior to Admission medications   Medication Sig Start Date End Date Taking? Authorizing Provider  aspirin EC 81 MG tablet Take 81 mg by mouth daily.    Historical Provider, MD  fexofenadine (ALLEGRA) 180 MG tablet Take 180 mg by mouth daily.      Historical Provider, MD  fluocinonide (LIDEX) 0.05 % external solution Apply 1 application topically daily as needed (scalp itching).  04/29/13   Historical Provider, MD  Glucosamine-Chondroit-Vit C-Mn (GLUCOSAMINE CHONDR 1500 COMPLX PO) Take 1  tablet by mouth 3 (three) times daily.      Historical Provider, MD  hydrALAZINE (APRESOLINE) 50 MG tablet Take 1/2 tablet by mouth three times daily    Historical Provider, MD  hyoscyamine (ANASPAZ) 0.125 MG TBDP disintergrating tablet Place 0.125 mg under the tongue every 6 (six) hours as needed for bladder spasms or cramping.    Historical Provider, MD  ketoconazole (NIZORAL) 2 % shampoo Apply 1 application topically 2 (two) times a week. As needed for itching a couple of times week 04/29/13   Historical Provider, MD  latanoprost (XALATAN) 0.005 % ophthalmic solution Place 1 drop into both eyes at bedtime.  07/31/12   Historical Provider, MD  letrozole (FEMARA) 2.5 MG tablet Take 1 tablet (2.5 mg total) by mouth daily. 07/27/13    Amy Milda Smart, PA-C  losartan (COZAAR) 100 MG tablet Take 1/2 tablet by mouth three times daily    Historical Provider, MD  magnesium oxide (MAG-OX) 400 MG tablet Take 400 mg by mouth daily.    Historical Provider, MD  metoprolol (LOPRESSOR) 50 MG tablet Take 1 tablet (50 mg total) by mouth 3 (three) times daily. 02/14/14   Josue Hector, MD  metroNIDAZOLE (FLAGYL) 250 MG tablet Take 1 tablet (250 mg total) by mouth 3 (three) times daily. 05/09/14   Lori P Hvozdovic, PA-C  mometasone (NASONEX) 50 MCG/ACT nasal spray Place 2 sprays into the nose daily as needed (congestion).     Historical Provider, MD  nitroGLYCERIN (NITROSTAT) 0.4 MG SL tablet Place 1 tablet (0.4 mg total) under the tongue every 5 (five) minutes as needed for chest pain. 03/24/14   Josue Hector, MD  Omega-3 Fatty Acids (FISH OIL) 1200 MG CAPS Take 1,200 mg by mouth daily.     Historical Provider, MD  omeprazole (PRILOSEC) 40 MG capsule Take 1 capsule (40 mg total) by mouth daily. 30 minutes before 1st meal of the day 12/30/13   Noralee Space, MD  PRESCRIPTION MEDICATION Vitamin B 2    Historical Provider, MD  sulfamethoxazole-trimethoprim (BACTRIM,SEPTRA) 400-80 MG per tablet Take 1 tablet by mouth 2 (two) times daily. 05/09/14   Lori P Hvozdovic, PA-C  tiZANidine (ZANAFLEX) 2 MG tablet Take 2 mg by mouth at bedtime.    Historical Provider, MD  traMADol (ULTRAM) 50 MG tablet Take 1 tablet (50 mg total) by mouth 3 (three) times daily as needed. 12/30/13   Noralee Space, MD  vitamin B-12 (CYANOCOBALAMIN) 1000 MCG tablet Take 1,000 mcg by mouth daily.      Historical Provider, MD   BP 192/76 mmHg  Pulse 53  Temp(Src) 97.5 F (36.4 C) (Oral)  Resp 20  Ht 4\' 11"  (1.499 m)  Wt 134 lb (60.782 kg)  BMI 27.05 kg/m2  SpO2 100%  LMP 11/26/1990 Physical Exam  Constitutional: She is oriented to person, place, and time. She appears well-developed and well-nourished. No distress.  HENT:  Head: Normocephalic and atraumatic.  Mouth/Throat:  Oropharynx is clear and moist.  Neck: Normal range of motion. Neck supple.  Cardiovascular: Normal rate and regular rhythm.  Exam reveals no gallop and no friction rub.   No murmur heard. Pulmonary/Chest: Effort normal and breath sounds normal. No respiratory distress. She has no wheezes.  Abdominal: Soft. Bowel sounds are normal. She exhibits no distension. There is no tenderness.  Musculoskeletal: Normal range of motion.  Neurological: She is alert and oriented to person, place, and time.  Skin: Skin is warm and dry. She is not  diaphoretic.  Nursing note and vitals reviewed.   ED Course  Procedures (including critical care time) Labs Review Labs Reviewed  BASIC METABOLIC PANEL  CBC WITH DIFFERENTIAL/PLATELET  URINALYSIS, ROUTINE W REFLEX MICROSCOPIC    Imaging Review No results found.  ED ECG REPORT   Date: 05/16/2014  Rate: 53  Rhythm: sinus bradycardia  QRS Axis: normal  Intervals: normal  ST/T Wave abnormalities: normal  Conduction Disutrbances:none  Narrative Interpretation:   Old EKG Reviewed: none available  I have personally reviewed the EKG tracing and agree with the computerized printout as noted.   MDM   Final diagnoses:  None    Patient presents with complaints of dry mouth, heart racing that woke her from sleep.  Her labs are reassuring and she is feeling better with iv hydration.  To return prn.    Veryl Speak, MD 05/16/14 [phone]

## 2014-05-16 NOTE — ED Notes (Addendum)
Woke at 2330 with heart racing, dry mouth and throat and head pounding, also reports diarrhea. Taking abx (flagyl and bactrim) for diverticulitis. Denies feeling of heart racing now. "only pain is head tightness".  No dyspnea noted. Alert, NAD, calm, voice hoarse, interactive.

## 2014-05-17 ENCOUNTER — Telehealth: Payer: Self-pay

## 2014-05-17 DIAGNOSIS — S13140A Subluxation of C3/C4 cervical vertebrae, initial encounter: Secondary | ICD-10-CM | POA: Diagnosis not present

## 2014-05-17 DIAGNOSIS — G43611 Persistent migraine aura with cerebral infarction, intractable, with status migrainosus: Secondary | ICD-10-CM | POA: Diagnosis not present

## 2014-05-17 DIAGNOSIS — R51 Headache: Secondary | ICD-10-CM | POA: Diagnosis not present

## 2014-05-17 MED ORDER — NORTRIPTYLINE HCL 10 MG PO CAPS
10.0000 mg | ORAL_CAPSULE | Freq: Every day | ORAL | Status: DC
Start: 2014-05-17 — End: 2014-06-23

## 2014-05-17 NOTE — Telephone Encounter (Signed)
[facility] indicates they received a Rx for Nortriptyline 25mg  on 03/17 with instructions of 1/2 po qhs for 1 week then one qhs.  (This is no longer on med list, but is on med history.)  Since this is a capsule, they are unable to half the dose.  They would like to know if they should dispense a Rx for 10mg  for 1 week, then a separate Rx for 25mg  to take thereafter, or if patient can just take 25mg  qhs.  Please advise.  Thank you.

## 2014-05-17 NOTE — Telephone Encounter (Signed)
[name], please call pharmacy, if they have tab, let her have Nortriptyline 25mg  tab, if not, let her have 10mg  capsule, one po qhs xone week, then 2 tabs po qhs.

## 2014-05-17 NOTE — Telephone Encounter (Signed)
Unfortunately, the pharmacy only has capsules.  Rx has been updated to 10mg  as instructed below.

## 2014-05-25 DIAGNOSIS — J301 Allergic rhinitis due to pollen: Secondary | ICD-10-CM | POA: Diagnosis not present

## 2014-05-25 DIAGNOSIS — J3089 Other allergic rhinitis: Secondary | ICD-10-CM | POA: Diagnosis not present

## 2014-05-26 ENCOUNTER — Telehealth: Payer: Self-pay | Admitting: Pulmonary Disease

## 2014-05-26 MED ORDER — LOSARTAN POTASSIUM 100 MG PO TABS: 50.0000 mg | ORAL_TABLET | Freq: Three times a day (TID) | ORAL | Status: DC

## 2014-05-26 NOTE — Telephone Encounter (Signed)
Spoke with the pt  She is needing new rx for losartan 100 mg 1/2 tid  Rx was sent to pharm  Nothing further needed per pt

## 2014-05-31 DIAGNOSIS — R51 Headache: Secondary | ICD-10-CM | POA: Diagnosis not present

## 2014-05-31 DIAGNOSIS — M5032 Other cervical disc degeneration, mid-cervical region: Secondary | ICD-10-CM | POA: Diagnosis not present

## 2014-05-31 DIAGNOSIS — S13140A Subluxation of C3/C4 cervical vertebrae, initial encounter: Secondary | ICD-10-CM | POA: Diagnosis not present

## 2014-05-31 DIAGNOSIS — M5412 Radiculopathy, cervical region: Secondary | ICD-10-CM | POA: Diagnosis not present

## 2014-06-02 ENCOUNTER — Telehealth: Payer: Self-pay | Admitting: *Deleted

## 2014-06-02 DIAGNOSIS — J3089 Other allergic rhinitis: Secondary | ICD-10-CM | POA: Diagnosis not present

## 2014-06-02 DIAGNOSIS — J301 Allergic rhinitis due to pollen: Secondary | ICD-10-CM | POA: Diagnosis not present

## 2014-06-02 NOTE — Telephone Encounter (Signed)
Patient called inquiring about a "lenght of need form" that [employer] required in order for her to be placed on CPAP therapy.  Numerous questions we're addressed and I will follow up with [employer] and patient regarding form.

## 2014-06-10 DIAGNOSIS — J301 Allergic rhinitis due to pollen: Secondary | ICD-10-CM | POA: Diagnosis not present

## 2014-06-10 DIAGNOSIS — J3089 Other allergic rhinitis: Secondary | ICD-10-CM | POA: Diagnosis not present

## 2014-06-15 DIAGNOSIS — M5412 Radiculopathy, cervical region: Secondary | ICD-10-CM | POA: Diagnosis not present

## 2014-06-15 DIAGNOSIS — R51 Headache: Secondary | ICD-10-CM | POA: Diagnosis not present

## 2014-06-15 DIAGNOSIS — S13140A Subluxation of C3/C4 cervical vertebrae, initial encounter: Secondary | ICD-10-CM | POA: Diagnosis not present

## 2014-06-17 DIAGNOSIS — J301 Allergic rhinitis due to pollen: Secondary | ICD-10-CM | POA: Diagnosis not present

## 2014-06-17 DIAGNOSIS — J3089 Other allergic rhinitis: Secondary | ICD-10-CM | POA: Diagnosis not present

## 2014-06-20 ENCOUNTER — Other Ambulatory Visit: Payer: Self-pay

## 2014-06-20 MED ORDER — METOPROLOL TARTRATE 50 MG PO TABS: 50.0000 mg | ORAL_TABLET | Freq: Three times a day (TID) | ORAL | Status: DC

## 2014-06-23 ENCOUNTER — Ambulatory Visit (INDEPENDENT_AMBULATORY_CARE_PROVIDER_SITE_OTHER): Payer: Medicare Other | Admitting: Physician Assistant

## 2014-06-23 ENCOUNTER — Encounter: Payer: Self-pay | Admitting: Physician Assistant

## 2014-06-23 VITALS — BP 152/70 | HR 60 | Ht <= 58 in | Wt 134.1 lb

## 2014-06-23 DIAGNOSIS — K589 Irritable bowel syndrome without diarrhea: Secondary | ICD-10-CM

## 2014-06-23 DIAGNOSIS — I2510 Atherosclerotic heart disease of native coronary artery without angina pectoris: Secondary | ICD-10-CM | POA: Diagnosis not present

## 2014-06-23 DIAGNOSIS — K219 Gastro-esophageal reflux disease without esophagitis: Secondary | ICD-10-CM

## 2014-06-23 DIAGNOSIS — Z86010 Personal history of colonic polyps: Secondary | ICD-10-CM

## 2014-06-23 DIAGNOSIS — J3089 Other allergic rhinitis: Secondary | ICD-10-CM | POA: Diagnosis not present

## 2014-06-23 DIAGNOSIS — J301 Allergic rhinitis due to pollen: Secondary | ICD-10-CM | POA: Diagnosis not present

## 2014-06-23 DIAGNOSIS — K5732 Diverticulitis of large intestine without perforation or abscess without bleeding: Secondary | ICD-10-CM | POA: Diagnosis not present

## 2014-06-23 MED ORDER — RANITIDINE HCL 300 MG PO CAPS: 300.0000 mg | ORAL_CAPSULE | Freq: Every evening | ORAL | Status: DC

## 2014-06-23 MED ORDER — NA SULFATE-K SULFATE-MG SULF 17.5-3.13-1.6 GM/177ML PO SOLN: 1.0000 | Freq: Once | ORAL | Status: DC

## 2014-06-23 NOTE — Progress Notes (Signed)
Reviewed and agree with management plan.  Malcolm T. Stark, MD FACG 

## 2014-06-23 NOTE — Progress Notes (Signed)
Patient ID: [name], female   DOB: 1936-02-07, 79 y.o.   MRN: [id number]     History of Present Illness: [name] is a delightful 79 year old female who is known to Dr. Fuller Plan. She has a history of diverticulitis and has been treated on several occasions since 2013. She was in the office in December 2015 with left lower quadrant pain and had a CT that showed diverticulitis. She was treated with Cipro and Flagyl for 10 days and felt better. In early March she again began to have left lower quadrant abdominal pain. She was seen at the [facility] emergency room and had a CT and labs that were normal she was given nausea meds and followed up here a week later with worsening pain she was treated with Flagyl and Bactrim and has had improvement of her discomfort. She has less pain but says her stools often alternate between formed and loose. She notes if she has milk or dairy products she has increased gas and bloating. She has tried almonds milk and her symptoms decrease. On a typical day she has 3 bowel movements in the morning right after breakfast and then no other bowel movements throughout the day except for an occasional bowel movement after supper. She feels hyoscyamine helps her lower abdominal cramping but feels it no longer helps her esophageal spasm. She does have a history of GERD and has been getting more frequent breakthrough heartburn she has been using her omeprazole at bedtime instead of in the morning. She has been belching a lot and feels like she has a rock in her stomach after meals. She feels as if she has a golf ball in her throat whenever she has anything cold to drink. She has also been having difficulty swallowing solids. She has to stop eating for a few minutes and let the food pass. She has no dysphagia to liquids. Review of her chart shows that her last colonoscopy was in March 2011 at which time an adenomatous polyp was removed and she was advised to have  surveillance in 5 years. She would like to schedule that at this time.   Past Medical History  Diagnosis Date  . Diverticulosis of colon (without mention of hemorrhage)   . Irritable bowel syndrome   . Colonic polyp 04-27-2009    tubular adenoma  . Headache(784.0)     a. frequently assocaited with high BPs  . Breast cancer, Left 12/20/2010    NO BLOOD PRESSURE CHECKS OR STICKS IN LEFT ARM  . Gallstones   . CAD (coronary artery disease)     a. 01/19/2010 s/p CABG x 3, lima->lad, vg->diag, vg->om1;  b. 06/2011 :Lexi MV: EF 84%, No ischemia. c. 01/06/14 s/p negative nuclear stress test with EF >70%  . Fibromyalgia   . Atrophic vaginitis   . GERD (gastroesophageal reflux disease)   . Hypercholesterolemia   . Glaucoma   . Cervical arthritis   . Hiatal hernia   . Labile hypertension   . Asthma     Past Surgical History  Procedure Laterality Date  . Coronary angioplasty with stent placement      Stent 2007  . Open heart surgery      01/19/2010  . Coronary artery bypass graft    . Cataract extraction, bilateral      bilateral caaract removal,  . Breast lumpectomy Left 02/06/11   Family History  Problem Relation Age of Onset  . Heart disease Brother   . Cancer  Brother     gland cancer  . Diabetes Sister   . Breast cancer Sister   . Stroke Mother   . Stroke Father   . Colon cancer Neg Hx   . Stomach cancer Neg Hx   . Pancreatic cancer Neg Hx   . Prostate cancer Father   . Kidney disease Neg Hx   . Liver disease Neg Hx    History  Substance Use Topics  . Smoking status: Never Smoker   . Smokeless tobacco: Never Used  . Alcohol Use: No   Current Outpatient Prescriptions  Medication Sig Dispense Refill  . aspirin EC 81 MG tablet Take 81 mg by mouth daily.    . fexofenadine (ALLEGRA) 180 MG tablet Take 180 mg by mouth daily.      . fluocinonide (LIDEX) 0.05 % external solution Apply 1 application topically daily as needed (scalp itching).     .  Glucosamine-Chondroit-Vit C-Mn (GLUCOSAMINE CHONDR 1500 COMPLX PO) Take 1 tablet by mouth 3 (three) times daily.      . hydrALAZINE (APRESOLINE) 50 MG tablet Take 1/2 tablet by mouth three times daily    . hyoscyamine (ANASPAZ) 0.125 MG TBDP disintergrating tablet Place 0.125 mg under the tongue every 6 (six) hours as needed for bladder spasms or cramping.    [name] ketoconazole (NIZORAL) 2 % shampoo Apply 1 application topically 2 (two) times a week. As needed for itching a couple of times week    . latanoprost (XALATAN) 0.005 % ophthalmic solution Place 1 drop into both eyes at bedtime.     [name] letrozole (FEMARA) 2.5 MG tablet Take 1 tablet (2.5 mg total) by mouth daily. 90 tablet 3  . losartan (COZAAR) 100 MG tablet Take 0.5 tablets (50 mg total) by mouth 3 (three) times daily. Take 1/2 tablet by mouth three times daily 45 tablet 5  . magnesium oxide (MAG-OX) 400 MG tablet Take 400 mg by mouth daily.    . metoprolol (LOPRESSOR) 50 MG tablet Take 1 tablet (50 mg total) by mouth 3 (three) times daily. 90 tablet 1  . mometasone (NASONEX) 50 MCG/ACT nasal spray Place 2 sprays into the nose daily as needed (congestion).     . nitroGLYCERIN (NITROSTAT) 0.4 MG SL tablet Place 1 tablet (0.4 mg total) under the tongue every 5 (five) minutes as needed for chest pain. 25 tablet 3  . Omega-3 Fatty Acids (FISH OIL) 1200 MG CAPS Take 1,200 mg by mouth daily.     [name] omeprazole (PRILOSEC) 40 MG capsule Take 1 capsule (40 mg total) by mouth daily. 30 minutes before 1st meal of the day 30 capsule 6  . PRESCRIPTION MEDICATION Vitamin B 2    . tiZANidine (ZANAFLEX) 2 MG tablet Take 2 mg by mouth at bedtime.    . traMADol (ULTRAM) 50 MG tablet Take 1 tablet (50 mg total) by mouth 3 (three) times daily as needed. 90 tablet 5  . vitamin B-12 (CYANOCOBALAMIN) 1000 MCG tablet Take 1,000 mcg by mouth daily.      . Na Sulfate-K Sulfate-Mg Sulf SOLN Take 1 kit by mouth once. 354 mL 0  . ranitidine (ZANTAC) 300 MG capsule Take 1  capsule (300 mg total) by mouth every evening. 30 capsule 3   No current facility-administered medications for this visit.   Allergies  Allergen Reactions  . Choline Fenofibrate Other (See Comments)     pt states INTOL to Trilipix w/ "thigh burning"  . Simvastatin Other (See Comments)  pt states INTOL to STATINS \T\ refuses to restart  . Hctz [Hydrochlorothiazide]     Causes hyponatremia  . Hydrocodone     Nightmare after taking cough syrup w/hydrocodone  . Levaquin [Levofloxacin In D5w]     Elevated BP  . Adhesive [Tape] Rash  . Ceclor [Cefaclor] Rash  . Clarithromycin Rash  . Codeine Nausea Only  . Doxycycline Rash  . Lisinopril Cough    Developed ACE cough...  . Penicillins Itching and Rash    At injection site  . Tobramycin-Dexamethasone Rash      Review of Systems: Gen: Denies any fever, chills, sweats, anorexia, fatigue, weakness, malaise, weight loss, and sleep disorder CV: Denies chest pain, angina, palpitations, syncope, orthopnea, PND, peripheral edema, and claudication. Resp: Denies dyspnea at rest, dyspnea with exercise, cough, sputum, wheezing, coughing up blood, and pleurisy. GI: Denies vomiting blood, jaundice, and fecal incontinence.  Has dysphagia to solids. GU : Denies urinary burning, blood in urine, urinary frequency, urinary hesitancy, nocturnal urination, and urinary incontinence. MS: Denies joint pain, limitation of movement, and swelling, stiffness, low back pain, extremity pain. Denies muscle weakness, cramps, atrophy.  Derm: Denies rash, itching, dry skin, hives, moles, warts, or unhealing ulcers.  Psych: Denies depression, anxiety, memory loss, suicidal ideation, hallucinations, paranoia, and confusion. Heme: Denies bruising, bleeding, and enlarged lymph nodes. Neuro:  Denies any headaches, dizziness, paresthesia Endo:  Denies any problems with DM, thyroid, adrenal    Physical Exam: General: Pleasant, well developed ,female in no acute  distress Head: Normocephalic and atraumatic Eyes:  sclerae anicteric, conjunctiva pink  Ears: Normal auditory acuity Lungs: Clear throughout to auscultation Heart: Regular rate and rhythm Abdomen: Soft, non distended, non-tender. No masses, no hepatomegaly. Normal bowel sounds Musculoskeletal: Symmetrical with no gross deformities  Extremities: No edema  Neurological: Alert oriented x 4, grossly nonfocal Psychological:  Alert and cooperative. Normal mood and affect  Assessment and Recommendations: #1. GERD. An antireflux regimen has been reviewed. She has been advised to use her omeprazole 30 minutes before breakfast in the morning. She will also be given a trial of ranitidine 300 mg daily at bedtime.  #2. Dysphagia. This may be due to poorly controlled reflux. The patient will be scheduled for a barium swallow with tablet. After completion of this she will be scheduled for an EGD to evaluate for esophagitis, gastritis, ulcer, stricture, etc.The risks, benefits, and alternatives to endoscopy with possible biopsy and possible dilation were discussed with the patient and they consent to proceed.    #3. Diverticulosis. IBS. Her erratic bowel movements may be due to a segment of spastic diverticular disease or IBS. She's been instructed to adhere to a high-fiber low-fat diet. She will use Benefiber 1 heaping tablespoon daily to try to regulate the consistency of her stool. She will continue hyoscyamine as needed. She will also try a lactose-free diet and has been instructed to try Lactaid tablets before she has milk or dairy products.  #4. Personal history of adenomatous colon polyps. Last colonoscopy was in 2011 by Dr. Sharlett Iles at which time an adenomatous polyp was removed from the cecum. Patient is due for surveillance.The risks, benefits, and alternatives to colonoscopy with possible biopsy and possible polypectomy were discussed with the patient and they consent to proceed.  The procedure will  be scheduled with Dr. Fuller Plan.  Further recommendations will be made pending the findings of the above tests.        Lauriana Denes, Deloris Ping 06/23/2014,

## 2014-06-23 NOTE — Patient Instructions (Signed)
You have been scheduled for an endoscopy and colonoscopy. Please follow the written instructions given to you at your visit today. Please pick up your prep supplies at the pharmacy within the next 1-3 days. If you use inhalers (even only as needed), please bring them with you on the day of your procedure. Your physician has requested that you go to www.startemmi.com and enter the access code given to you at your visit today. This web site gives a general overview about your procedure. However, you should still follow specific instructions given to you by our office regarding your preparation for the procedure. You have been scheduled for a Barium Swallow at [facility] (1st floor of the hospital) on 06/24/14 at 11 am. Please arrive 15 minutes prior to your appointment for registration. Make certain not to have anything to eat or drink 3 hours prior to your test. If you need to reschedule for any reason, please contact radiology at [phone] to do so. Continue omeprazole. We have sent the following medications to your pharmacy for you to pick up at your convenience:  Ranitidine Benefiber 1 heaping tablespoon daily. Continue Hyoscyamine as directed.   Lactose-Free Diet Lactose is a carbohydrate that is found mainly in milk and milk products, as well as in foods with added milk or whey. Lactose must be digested by the enzyme lactase in order to be used by the body. Lactose intolerance occurs when there is a shortage of lactase. When your body is not able to digest lactose, you may feel sick to your stomach (nausea), bloated, and have cramps, gas, and diarrhea. TYPES OF LACTASE DEFICIENCY  Primary lactase deficiency. This is the most common type. It is characterized by a slow decrease in lactase activity.  Secondary lactase deficiency. This occurs following injury to the small intestinal mucosa as a result of a disease or condition. It can also occur as a result of surgery or after treatment  with antibiotic medicines or cancer drugs. Tolerance to lactose varies widely. Each person must determine how much milk can be consumed without developing symptoms. Drinking smaller portions of milk throughout the day may be helpful. Some studies suggest that slowing gastric emptying may help increase tolerance of milk products. This may be done by:  Consuming milk or milk products with a meal rather than alone.  Consuming milk with a higher fat content. There are many dairy products that may be tolerated better than milk by some people, including:  Cheese (especially aged cheese). The lactose content is much lower than in milk.  Cultured dairy products, such as yogurt, buttermilk, cottage cheese, and sweet acidophilus milk (kefir). These products are usually well tolerated by lactase-deficient people. This is because the healthy bacteria help digest lactose.  Lactose-hydrolyzed milk. This product contains 40% to 90% less lactose than milk and may also be well tolerated. ADEQUACY These diets may be deficient in calcium, riboflavin, and vitamin D, according to the Recommended Dietary Allowances of the [employer]. Depending on individual tolerances and the use of milk substitutes, milk, or other dairy products, you may be able to meet these recommendations. SPECIAL NOTES  Lactose is a carbohydrate. The main food source for lactose is dairy products. Reading food labels is important. Many products contain lactose even when they are not made from milk. Look for the following words: whey, milk solids, dry milk solids, nonfat dry milk powder. Typical sources of lactose other than dairy products include breads, candies, cold cuts, prepared and processed foods,  and commercial sauces and gravies.  All foods must be prepared without milk, cream, or other dairy foods.  A vitamin or mineral supplement may be necessary. Consult your caregiver or Registered Dietitian.  Lactose is also found  in many prescription and over-the-counter medicines.  Soy milk and lactose-free supplements may be used as an alternative to milk. CHOOSING FOODS Breads and Starches  Allowed: Breads and rolls made without milk. [place], [place], or [place] bread. Soda crackers, graham crackers. Any crackers prepared without lactose. Cooked or dry cereals prepared without lactose (read labels). Any potatoes, pasta, or rice prepared without milk or lactose. Popcorn.  Avoid: Breads and rolls that contain milk. Prepared mixes such as muffins, biscuits, waffles, pancakes. Sweet rolls, donuts, [place] toast (if made with milk or lactose). Zwieback crackers, corn curls, or any crackers that contain lactose. Cooked or dry cereals prepared with lactose (read labels). Instant potatoes, frozen [place] fries, scalloped or au gratin potatoes. Vegetables  Allowed: Fresh, frozen, and canned vegetables.  Avoid: Creamed or breaded vegetables. Vegetables in a cheese sauce or with lactose-containing margarines. Fruit  Allowed: All fresh, canned, or frozen fruits that are not processed with lactose.  Avoid: Any canned or frozen fruits processed with lactose. Meat and Meat Substitutes  Allowed: Plain beef, chicken, fish, [place], lamb, veal, pork, or ham. Kosher prepared meat products. Strained or junior meats that do not contain milk. Eggs, soy meat substitutes, nuts.  Avoid: Scrambled eggs, omelets, and souffles that contain milk. Creamed or breaded meat, fish, or fowl. Sausage products such as wieners, liver sausage, or cold cuts that contain milk solids. Cheese, cottage cheese, or cheese spreads. Milk  Allowed: None.  Avoid: Milk (whole, 2%, skim, or chocolate). Evaporated, powdered, or condensed milk. Malted milk. Soups and Combination Foods  Allowed: Bouillon, broth, vegetable soups, clear soups, consomms. Homemade soups made with allowed ingredients. Combination or prepared foods that do not contain milk or milk  products (read labels).  Avoid: Cream soups, chowders, commercially prepared soups containing lactose. Macaroni and cheese, pizza. Combination or prepared foods that contain milk or milk products. Desserts and Sweets  Allowed: Water and fruit ices, gelatin, angel food cake. Homemade cookies, pies, or cakes made from allowed ingredients. Pudding (if made with water or a milk substitute). Lactose-free tofu desserts. Sugar, honey, corn syrup, jam, jelly, marmalade, molasses (beet sugar). Pure sugar candy, marshmallows.  Avoid: Ice cream, ice milk, sherbet, custard, pudding, frozen yogurt. Commercial cake and cookie mixes. Desserts that contain chocolate. Pie crust made with milk-containing margarine. Reduced calorie desserts made with a sugar substitute that contains lactose. Toffee, peppermint, butterscotch, chocolate, caramels. Fats and Oils  Allowed: Butter (as tolerated, contains very small amounts of lactose). Margarines and dressings that do not contain milk. Vegetable oils, shortening, mayonnaise, nondairy cream and whipped toppings without lactose or milk solids added. Berniece Salines.  Avoid: Margarines and salad dressings containing milk. Cream, cream cheese, peanut butter with added milk solids, sour cream, chip dips made with sour cream. Beverages  Allowed: Carbonated drinks, tea, coffee and freeze-dried coffee, some instant coffees (check labels). Fruit drinks, fruit and vegetable juice, rice or soy milk.  Avoid: Hot chocolate. Some cocoas, some instant coffees, instant iced teas, powdered fruit drinks (read labels). Condiments  Allowed: Soy sauce, carob powder, olives, gravy made with water, baker's cocoa, pickles, pure seasonings and spices, wine, pure monosodium glutamate, catsup, mustard.  Avoid: Some chewing gums, chocolate, some cocoas. Certain antibiotics and vitamin or mineral preparations. Spice blends if they contain milk products.  MSG extender. Artificial sweeteners that contain  lactose. Some nondairy creamers (read labels). SAMPLE MENU Breakfast  Orange juice.  Banana.  Bran cereal.  Nondairy creamer.  [place] bread, toasted.  Butter or milk-free margarine.  Coffee or tea. Lunch  Chicken breast.  Rice.  Green beans.  Butter or milk-free margarine.  Fresh melon.  Coffee or tea. Dinner  [employer].  Baked potato.  Butter or milk-free margarine.  Broccoli.  Lettuce salad with vinegar and oil dressing.  [employer]  Coffee or tea. Document Released: 08/03/2001 Document Revised: 05/06/2011 Document Reviewed: 05/14/2013 [facility] Patient Information 2015 [place], [place]. This information is not intended to replace advice given to you by your health care provider. Make sure you discuss any questions you have with your health care provider.  Use lactaid tablets before milk or dairy

## 2014-06-24 ENCOUNTER — Ambulatory Visit (HOSPITAL_COMMUNITY)
Admission: RE | Admit: 2014-06-24 | Discharge: 2014-06-24 | Disposition: A | Discharge status: Home / Self Care | Payer: Medicare Other | Source: Ambulatory Visit | Attending: Physician Assistant | Admitting: Physician Assistant

## 2014-06-24 DIAGNOSIS — K219 Gastro-esophageal reflux disease without esophagitis: Secondary | ICD-10-CM | POA: Insufficient documentation

## 2014-06-24 DIAGNOSIS — K589 Irritable bowel syndrome without diarrhea: Secondary | ICD-10-CM

## 2014-06-24 DIAGNOSIS — Z86010 Personal history of colonic polyps: Secondary | ICD-10-CM

## 2014-06-24 DIAGNOSIS — K5732 Diverticulitis of large intestine without perforation or abscess without bleeding: Secondary | ICD-10-CM

## 2014-06-24 DIAGNOSIS — R1310 Dysphagia, unspecified: Secondary | ICD-10-CM | POA: Insufficient documentation

## 2014-06-29 ENCOUNTER — Encounter: Payer: Self-pay | Admitting: Gastroenterology

## 2014-06-29 ENCOUNTER — Ambulatory Visit (AMBULATORY_SURGERY_CENTER): Payer: Medicare Other | Admitting: Gastroenterology

## 2014-06-29 ENCOUNTER — Other Ambulatory Visit: Payer: Self-pay | Admitting: Gastroenterology

## 2014-06-29 VITALS — BP 138/62 | HR 59 | Temp 98.1°F | Resp 18 | Ht <= 58 in | Wt 134.0 lb

## 2014-06-29 DIAGNOSIS — R1310 Dysphagia, unspecified: Secondary | ICD-10-CM

## 2014-06-29 DIAGNOSIS — Z86010 Personal history of colonic polyps: Secondary | ICD-10-CM

## 2014-06-29 DIAGNOSIS — K5730 Diverticulosis of large intestine without perforation or abscess without bleeding: Secondary | ICD-10-CM | POA: Diagnosis not present

## 2014-06-29 DIAGNOSIS — K2970 Gastritis, unspecified, without bleeding: Secondary | ICD-10-CM | POA: Diagnosis not present

## 2014-06-29 DIAGNOSIS — D124 Benign neoplasm of descending colon: Secondary | ICD-10-CM | POA: Diagnosis not present

## 2014-06-29 DIAGNOSIS — G4733 Obstructive sleep apnea (adult) (pediatric): Secondary | ICD-10-CM | POA: Diagnosis not present

## 2014-06-29 DIAGNOSIS — D123 Benign neoplasm of transverse colon: Secondary | ICD-10-CM

## 2014-06-29 DIAGNOSIS — K589 Irritable bowel syndrome without diarrhea: Secondary | ICD-10-CM | POA: Diagnosis not present

## 2014-06-29 DIAGNOSIS — K700 Alcoholic fatty liver: Secondary | ICD-10-CM | POA: Diagnosis not present

## 2014-06-29 DIAGNOSIS — K219 Gastro-esophageal reflux disease without esophagitis: Secondary | ICD-10-CM | POA: Diagnosis not present

## 2014-06-29 DIAGNOSIS — I2510 Atherosclerotic heart disease of native coronary artery without angina pectoris: Secondary | ICD-10-CM | POA: Diagnosis not present

## 2014-06-29 DIAGNOSIS — K635 Polyp of colon: Secondary | ICD-10-CM | POA: Diagnosis not present

## 2014-06-29 MED ORDER — SODIUM CHLORIDE 0.9 % IV SOLN: 500.0000 mL | INTRAVENOUS | Status: DC

## 2014-06-29 NOTE — Progress Notes (Signed)
Stable to RR 

## 2014-06-29 NOTE — Progress Notes (Signed)
[place] out.  Bleeding from insertion site.  Pressure applied to stop bleeding, no bruising or c/o pain.  Pressure dressing applied to site.  Site still without bruising, drainage, pain or swelling

## 2014-06-29 NOTE — Patient Instructions (Signed)
See Procedure Report for findings and recommendations  YOU HAD AN ENDOSCOPIC PROCEDURE TODAY AT [place]:   Refer to the procedure report that was given to you for any specific questions about what was found during the examination.  If the procedure report does not answer your questions, please call your gastroenterologist to clarify.  If you requested that your care partner not be given the details of your procedure findings, then the procedure report has been included in a sealed envelope for you to review at your convenience later.  YOU SHOULD EXPECT: Some feelings of bloating in the abdomen. Passage of more gas than usual.  Walking can help get rid of the air that was put into your GI tract during the procedure and reduce the bloating. If you had a lower endoscopy (such as a colonoscopy or flexible sigmoidoscopy) you may notice spotting of blood in your stool or on the toilet paper. If you underwent a bowel prep for your procedure, you may not have a normal bowel movement for a few days.  Please Note:  You might notice some irritation and congestion in your nose or some drainage.  This is from the oxygen used during your procedure.  There is no need for concern and it should clear up in a day or so.  SYMPTOMS TO REPORT IMMEDIATELY:   Following lower endoscopy (colonoscopy or flexible sigmoidoscopy):  Excessive amounts of blood in the stool  Significant tenderness or worsening of abdominal pains  Swelling of the abdomen that is new, acute  Fever of 100F or higher   Following upper endoscopy (EGD)  Vomiting of blood or coffee ground material  New chest pain or pain under the shoulder blades  Painful or persistently difficult swallowing  New shortness of breath  Fever of 100F or higher  Black, tarry-looking stools  For urgent or emergent issues, a gastroenterologist can be reached at any hour by calling [phone].   DIET: Your first meal following the  procedure should be a small meal and then it is ok to progress to your normal diet. Heavy or fried foods are harder to digest and may make you feel nauseous or bloated.  Likewise, meals heavy in dairy and vegetables can increase bloating.  Drink plenty of fluids but you should avoid alcoholic beverages for 24 hours.  ACTIVITY:  You should plan to take it easy for the rest of today and you should NOT DRIVE or use heavy machinery until tomorrow (because of the sedation medicines used during the test).    FOLLOW UP: Our staff will call the number listed on your records the next business day following your procedure to check on you and address any questions or concerns that you may have regarding the information given to you following your procedure. If we do not reach you, we will leave a message.  However, if you are feeling well and you are not experiencing any problems, there is no need to return our call.  We will assume that you have returned to your regular daily activities without incident.  If any biopsies were taken you will be contacted by phone or by letter within the next 1-3 weeks.  Please call us at [phone] if you have not heard about the biopsies in 3 weeks.    SIGNATURES/CONFIDENTIALITY: You and/or your care partner have signed paperwork which will be entered into your electronic medical record.  These signatures attest to the fact that that the information above  on your After Visit Summary has been reviewed and is understood.  Full responsibility of the confidentiality of this discharge information lies with you and/or your care-partner.  Please follow all discharge instructions given to you by the recovery room nurse. If you have any questions or problems after discharge please call one of the numbers listed above. You will receive a phone call in the am to see how you are doing and answer any questions you may have. Thank you for choosing [facility] for your health  care needs. 

## 2014-06-29 NOTE — Progress Notes (Signed)
Called to room to assist during endoscopic procedure.  Patient ID and intended procedure confirmed with present staff. Received instructions for my participation in the procedure from the performing physician.  

## 2014-06-29 NOTE — Op Note (Signed)
[place]  [address] [place], [id number]   COLONOSCOPY PROCEDURE REPORT  PATIENT: [name], [name]  MR#: [id number] BIRTHDATE: 25-Oct-1935 , 78  yrs. old GENDER: female ENDOSCOPIST: Ladene Artist, MD, [facility] PROCEDURE DATE:  06/29/2014 PROCEDURE:   Colonoscopy, surveillance and Colonoscopy with snare polypectomy First Screening Colonoscopy - Avg.  risk and is 50 yrs.  old or older - No.  Prior Negative Screening - Now for repeat screening. N/A  History of Adenoma - Now for follow-up colonoscopy & has been > or = to 3 yrs.  Yes hx of adenoma.  Has been 3 or more years since last colonoscopy.  Polyps Removed Today ASA CLASS:   Class III INDICATIONS:Surveillance due to prior colonic neoplasia and PH Colon Adenoma. MEDICATIONS: Monitored anesthesia care, Propofol 200 mg IV, and lidocaine 40 mg IV DESCRIPTION OF PROCEDURE:   After the risks benefits and alternatives of the procedure were thoroughly explained, informed consent was obtained.  The digital rectal exam revealed no abnormalities of the rectum.   The LB [id number]  endoscope was introduced through the anus and advanced to the cecum, which was identified by both the appendix and ileocecal valve. No adverse events experienced.   The quality of the prep was good.  (Suprep was used)  The instrument was then slowly withdrawn as the colon was fully examined.    COLON FINDINGS: Two sessile polyps measuring 6 mm in size were found in the descending colon and transverse colon.  Polypectomies were performed with a cold snare.  The resection was complete, the polyp tissue was completely retrieved and sent to histology.   There was moderate diverticulosis noted in the sigmoid colon.   The examination was otherwise normal.  Retroflexed views revealed no abnormalities. The time to cecum = 0.3 Withdrawal time = 8.7   The scope was withdrawn and the procedure completed. COMPLICATIONS: There were no  immediate complications.  ENDOSCOPIC IMPRESSION: 1.   Two sessile polyps in the descending colon and transverse colon; polypectomies performed with a cold snare 2.   Moderate diverticulosis in the sigmoid colon 3.   The examination was otherwise normal  RECOMMENDATIONS: 1.  Await pathology results 2.  High fiber diet with liberal fluid intake. 3.  Given your age, you will not need another colonoscopy for colon cancer screening or polyp surveillance.  These types of tests usually stop around the age 33.  eSigned:  Ladene Artist, MD, [facility] 06/29/2014 3:48 PM

## 2014-06-29 NOTE — Op Note (Signed)
[place]  [address] [place], [address]   ENDOSCOPY PROCEDURE REPORT  PATIENT: [name], [name]  MR#: [id number] BIRTHDATE: [birth year] , 78  yrs. old GENDER: female ENDOSCOPIST: Ladene Artist, MD, [facility] PROCEDURE DATE:  06/29/2014 PROCEDURE:  EGD, diagnostic ASA CLASS:     Class III INDICATIONS:  history of esophageal reflux and dysphagia. MEDICATIONS: Monitored anesthesia care, Residual sedation present, and Propofol 50 mg IV TOPICAL ANESTHETIC: none DESCRIPTION OF PROCEDURE: After the risks benefits and alternatives of the procedure were thoroughly explained, informed consent was obtained.  The LB [id number] endoscope was introduced through the mouth and advanced to the second portion of the duodenum , Without limitations.  The instrument was slowly withdrawn as the mucosa was fully examined.    ESOPHAGUS: The mucosa of the esophagus appeared normal. STOMACH: The mucosa and folds of the stomach appeared normal. DUODENUM: The duodenal mucosa showed no abnormalities in the bulb and 2nd part of the duodenum.  Retroflexed views revealed no abnormalities.   The scope was then withdrawn from the patient and the procedure completed.  COMPLICATIONS: There were no immediate complications.  ENDOSCOPIC IMPRESSION: 1.   The EGD appeared normal  RECOMMENDATIONS: 1.  Anti-reflux regimen 2.  Continue PPI  eSigned:  Ladene Artist, MD, [facility] 06/29/2014 3:52 PM

## 2014-06-30 ENCOUNTER — Telehealth: Payer: Self-pay | Admitting: *Deleted

## 2014-06-30 DIAGNOSIS — M797 Fibromyalgia: Secondary | ICD-10-CM | POA: Diagnosis not present

## 2014-06-30 DIAGNOSIS — R51 Headache: Secondary | ICD-10-CM | POA: Diagnosis not present

## 2014-06-30 DIAGNOSIS — M9901 Segmental and somatic dysfunction of cervical region: Secondary | ICD-10-CM | POA: Diagnosis not present

## 2014-06-30 NOTE — Telephone Encounter (Signed)
  Follow up Call-  Call back number 06/29/2014  Post procedure Call Back phone  # [phone]   Permission to leave phone message Yes     Patient questions:  Do you have a fever, pain , or abdominal swelling? No. Pain Score  0 *  Have you tolerated food without any problems? Yes.    Have you been able to return to your normal activities? No.  Do you have any questions about your discharge instructions: Diet   No. Medications  No. Follow up visit  No.  Do you have questions or concerns about your Care? No.  Actions: * If pain score is 4 or above: No action needed, pain <4.  Pt has no pain or fever and has been able to eat without problems.  She states that she is just "wore out".  I suggested to take it [place] today and drink plenty of liquids.  She will call back with any further problems.

## 2014-07-01 DIAGNOSIS — J301 Allergic rhinitis due to pollen: Secondary | ICD-10-CM | POA: Diagnosis not present

## 2014-07-01 DIAGNOSIS — J3089 Other allergic rhinitis: Secondary | ICD-10-CM | POA: Diagnosis not present

## 2014-07-05 ENCOUNTER — Encounter: Payer: Self-pay | Admitting: Gastroenterology

## 2014-07-05 DIAGNOSIS — R51 Headache: Secondary | ICD-10-CM | POA: Diagnosis not present

## 2014-07-05 DIAGNOSIS — M9901 Segmental and somatic dysfunction of cervical region: Secondary | ICD-10-CM | POA: Diagnosis not present

## 2014-07-05 DIAGNOSIS — M797 Fibromyalgia: Secondary | ICD-10-CM | POA: Diagnosis not present

## 2014-07-06 ENCOUNTER — Encounter: Payer: Self-pay | Admitting: Gastroenterology

## 2014-07-11 NOTE — Telephone Encounter (Signed)
Patient is calling back as she has still not heard from [place] about her CPAP therapy. Please call.  Thanks!

## 2014-07-12 DIAGNOSIS — M797 Fibromyalgia: Secondary | ICD-10-CM | POA: Diagnosis not present

## 2014-07-12 DIAGNOSIS — M9901 Segmental and somatic dysfunction of cervical region: Secondary | ICD-10-CM | POA: Diagnosis not present

## 2014-07-12 DIAGNOSIS — M531 Cervicobrachial syndrome: Secondary | ICD-10-CM | POA: Diagnosis not present

## 2014-07-14 DIAGNOSIS — J301 Allergic rhinitis due to pollen: Secondary | ICD-10-CM | POA: Diagnosis not present

## 2014-07-14 DIAGNOSIS — J3089 Other allergic rhinitis: Secondary | ICD-10-CM | POA: Diagnosis not present

## 2014-07-18 ENCOUNTER — Telehealth: Payer: Self-pay | Admitting: Neurology

## 2014-07-18 ENCOUNTER — Telehealth: Payer: Self-pay | Admitting: Gastroenterology

## 2014-07-18 NOTE — Telephone Encounter (Signed)
I spoke to [place], they needed some more information for the order. They refaxed order to [place] and I had Dr. Rexene Alberts sign it. Patient was made aware of this. I I have faxed order back to [place]. I advised patient to call me back if any further questions. I will also mail her a letter to remind her to make f/u and the importance of compliance.

## 2014-07-18 NOTE — Telephone Encounter (Signed)
Patient reports continued abdominal pain.  She had a endo/colon on 07/01/14.  EGD was normal.  She was prescribed zantac in addition to prilosec that she has not been taking.  I have asked her to try adding this at night.  She also has hyoscyamine that she has not tried, she will try this as needed for the discomfort. She will call back if she has any additional questions or concerns.

## 2014-07-18 NOTE — Telephone Encounter (Signed)
Patient canceled her appointment with Dr. Krista Blue that she was suppose to have this Wednesday because she said that she has not yet got her CPAP. She said that was the only reason why she was having a follow up with Dr. Krista Blue was for the CPAP. Can you please check in for the order because the patient is understanding that she is suppose to be getting a CPAP. The best number to contact her is [phone]

## 2014-07-19 DIAGNOSIS — J301 Allergic rhinitis due to pollen: Secondary | ICD-10-CM | POA: Diagnosis not present

## 2014-07-19 DIAGNOSIS — M542 Cervicalgia: Secondary | ICD-10-CM | POA: Diagnosis not present

## 2014-07-19 DIAGNOSIS — J3089 Other allergic rhinitis: Secondary | ICD-10-CM | POA: Diagnosis not present

## 2014-07-19 DIAGNOSIS — G44209 Tension-type headache, unspecified, not intractable: Secondary | ICD-10-CM | POA: Diagnosis not present

## 2014-07-20 ENCOUNTER — Ambulatory Visit: Payer: Medicare Other | Admitting: Neurology

## 2014-07-26 ENCOUNTER — Other Ambulatory Visit: Payer: Medicare Other

## 2014-07-26 ENCOUNTER — Other Ambulatory Visit: Payer: Self-pay | Admitting: *Deleted

## 2014-07-27 ENCOUNTER — Telehealth: Payer: Self-pay | Admitting: Oncology

## 2014-07-27 NOTE — Telephone Encounter (Signed)
Called patient and left a message with a new lab appointment

## 2014-07-28 ENCOUNTER — Telehealth: Payer: Self-pay

## 2014-07-28 DIAGNOSIS — M542 Cervicalgia: Secondary | ICD-10-CM | POA: Diagnosis not present

## 2014-07-28 DIAGNOSIS — R51 Headache: Secondary | ICD-10-CM | POA: Diagnosis not present

## 2014-07-28 DIAGNOSIS — M5032 Other cervical disc degeneration, mid-cervical region: Secondary | ICD-10-CM | POA: Diagnosis not present

## 2014-07-28 DIAGNOSIS — M5412 Radiculopathy, cervical region: Secondary | ICD-10-CM | POA: Diagnosis not present

## 2014-07-28 DIAGNOSIS — J3089 Other allergic rhinitis: Secondary | ICD-10-CM | POA: Diagnosis not present

## 2014-07-28 DIAGNOSIS — J301 Allergic rhinitis due to pollen: Secondary | ICD-10-CM | POA: Diagnosis not present

## 2014-07-28 NOTE — Telephone Encounter (Signed)
I called [facility] today to follow up on if patient was able to get her CPAP machine. Staff reports that she has not received machine yet. They state they are waiting on final approval by Medicare.

## 2014-08-01 ENCOUNTER — Other Ambulatory Visit: Payer: Self-pay | Admitting: *Deleted

## 2014-08-01 DIAGNOSIS — C50519 Malignant neoplasm of lower-outer quadrant of unspecified female breast: Secondary | ICD-10-CM

## 2014-08-02 ENCOUNTER — Telehealth: Payer: Self-pay | Admitting: Oncology

## 2014-08-02 ENCOUNTER — Other Ambulatory Visit (HOSPITAL_BASED_OUTPATIENT_CLINIC_OR_DEPARTMENT_OTHER): Payer: Medicare Other

## 2014-08-02 ENCOUNTER — Ambulatory Visit (HOSPITAL_BASED_OUTPATIENT_CLINIC_OR_DEPARTMENT_OTHER): Payer: Medicare Other | Admitting: Oncology

## 2014-08-02 VITALS — BP 150/67 | HR 67 | Temp 97.9°F | Resp 18 | Ht <= 58 in | Wt 134.8 lb

## 2014-08-02 DIAGNOSIS — C50512 Malignant neoplasm of lower-outer quadrant of left female breast: Secondary | ICD-10-CM

## 2014-08-02 DIAGNOSIS — M8580 Other specified disorders of bone density and structure, unspecified site: Secondary | ICD-10-CM

## 2014-08-02 DIAGNOSIS — Z170 Estrogen receptor positive status [ER+]: Secondary | ICD-10-CM | POA: Diagnosis not present

## 2014-08-02 DIAGNOSIS — C50519 Malignant neoplasm of lower-outer quadrant of unspecified female breast: Secondary | ICD-10-CM

## 2014-08-02 DIAGNOSIS — J301 Allergic rhinitis due to pollen: Secondary | ICD-10-CM | POA: Diagnosis not present

## 2014-08-02 DIAGNOSIS — J3089 Other allergic rhinitis: Secondary | ICD-10-CM | POA: Diagnosis not present

## 2014-08-02 LAB — CBC WITH DIFFERENTIAL/PLATELET
BASO%: 0.9 % (ref 0.0–2.0)
Basophils Absolute: 0.1 10*3/uL (ref 0.0–0.1)
EOS%: 3.3 % (ref 0.0–7.0)
Eosinophils Absolute: 0.3 10*3/uL (ref 0.0–0.5)
HEMATOCRIT: 38.5 % (ref 34.8–46.6)
HGB: 13 g/dL (ref 11.6–15.9)
LYMPH%: 52 % — ABNORMAL HIGH (ref 14.0–49.7)
MCH: 30.6 pg (ref 25.1–34.0)
MCHC: 33.8 g/dL (ref 31.5–36.0)
MCV: 90.6 fL (ref 79.5–101.0)
MONO#: 0.7 10*3/uL (ref 0.1–0.9)
MONO%: 8.8 % (ref 0.0–14.0)
NEUT%: 35 % — ABNORMAL LOW (ref 38.4–76.8)
NEUTROS ABS: 2.7 10*3/uL (ref 1.5–6.5)
PLATELETS: 281 10*3/uL (ref 145–400)
RBC: 4.25 10*6/uL (ref 3.70–5.45)
RDW: 14 % (ref 11.2–14.5)
WBC: 7.8 10*3/uL (ref 3.9–10.3)
lymph#: 4.1 10*3/uL — ABNORMAL HIGH (ref 0.9–3.3)

## 2014-08-02 LAB — COMPREHENSIVE METABOLIC PANEL (CC13)
ALT: 22 U/L (ref 0–55)
AST: 21 U/L (ref 5–34)
Albumin: 4.2 g/dL (ref 3.5–5.0)
Alkaline Phosphatase: 84 U/L (ref 40–150)
Anion Gap: 9 mEq/L (ref 3–11)
BUN: 18.1 mg/dL (ref 7.0–26.0)
CO2: 29 mEq/L (ref 22–29)
Calcium: 9.5 mg/dL (ref 8.4–10.4)
Chloride: 98 mEq/L (ref 98–109)
Creatinine: 0.7 mg/dL (ref 0.6–1.1)
EGFR: 79 mL/min/{1.73_m2} — ABNORMAL LOW (ref >90)
Glucose: 147 mg/dl — ABNORMAL HIGH (ref 70–140)
Potassium: 4.4 mEq/L (ref 3.5–5.1)
Sodium: 136 mEq/L (ref 136–145)
Total Bilirubin: 1.07 mg/dL (ref 0.20–1.20)
Total Protein: 7.4 g/dL (ref 6.4–8.3)

## 2014-08-02 NOTE — Telephone Encounter (Signed)
Appointments made and avs printed for patient,[facility] 11/29/14 9;00 order faxed

## 2014-08-02 NOTE — Progress Notes (Signed)
ID: [name]   DOB: Feb 02, 1936  MR#: [id number]  VVY#:[id number]  PCP: Noralee Space, MD GYN: Kem Boroughs NP SU: Osborn Coho OTHER MD: Gery Pray, Christene Slates, Jenkins Rouge, Coralie Common  CHIEF COMPLAINT:  Hx of Left  Breast Cancer  CURRENT TREATMENT: Letrozole)   HISTORY OF PRESENT ILLNESS: From the original intake note:  [name] is a [place] woman originally referred by Dr. Marcelo Baldy for evaluation and treatment in the setting of newly diagnosed breast cancer.   The patient had routine screening mammography June 2011 which was unremarkable.  Repeat screening mammography December 11, 2010, at [facility], however, showed a possible abnormality in the left breast.  Additional imaging on October 18th showed an irregular, lobulated mass in the lower outer quadrant of the left breast which by ultrasound was irregular and hypoechoic.  It measured 2.5 cm by ultrasound.  The left axilla showed a benign-looking lymph node which did not appear to have changed compared to as seen in prior mammograms.   With this information, the patient underwent biopsy of the left breast mass October 25th, and this showed [phone]) an invasive mammary carcinoma with some lobular features but strongly E-cadherin positive, and so an invasive ductal carcinoma.  There was evidence of perineural invasion, although no definite evidence of angiolymphatic invasion.  The tumor had a CISH ratio of HER-2 to CEP17 signals of 1.15, indicating no amplification.  Estrogen receptor was positive at 100%, progesterone receptor was positive at 61%, and the proliferation marker was 72%.   With this information, the patient was referred to Dr. Margot Chimes, and bilateral breast MRIs were obtained December 24, 2010.  This showed the mass in the left breast to measure 1.7 cm, to be solitary, to be not contacting the pectoralis, and in addition, there was no suspicious finding in the right breast, no pathologically  enlarged axillary or internal mammary chain lymph nodes.   Subsequent history is as detailed below.   INTERVAL HISTORY: [name] returns today for follow-up of her breast cancer. She continues on letrozole, with excellent tolerance. Hot flashes and vaginal dryness are not a major issue for her. She has not experienced the arthralgias and myalgias that many patients can have on this medication. She obtains it for approximately $8 a month  REVIEW OF SYSTEMS: [name] has had significant problems with reflux. She had an esophagogram which was unremarkable. She is being treated aggressively for this and things are improving. She was tested for sleep apnea and was found to have that problem. His C Pap has been ordered but she has never received it. She also had a colonoscopy under Dr. Fuller Plan. She continues to have neck and head problems as she puts it, mostly dizziness, which are not getting any better. This is long-standing. A detailed review of systems today was otherwise stable  PAST MEDICAL HISTORY: Past Medical History  Diagnosis Date  . Diverticulosis of colon (without mention of hemorrhage)   . Irritable bowel syndrome   . Colonic polyp 04-27-2009    tubular adenoma  . Headache(784.0)     a. frequently assocaited with high BPs  . Breast cancer, Left 12/20/2010    NO BLOOD PRESSURE CHECKS OR STICKS IN LEFT ARM  . Gallstones   . CAD (coronary artery disease)     a. 01/19/2010 s/p CABG x 3, lima->lad, vg->diag, vg->om1;  b. 06/2011 :Lexi MV: EF 84%, No ischemia. c. 01/06/14 s/p negative nuclear stress test with EF >70%  . Fibromyalgia   .  Atrophic vaginitis   . GERD (gastroesophageal reflux disease)   . Hypercholesterolemia   . Glaucoma   . Cervical arthritis   . Hiatal hernia   . Labile hypertension   . Asthma   Coronary artery disease status post open heart surgery under Ed Gerhardt November 2011, with bypasses from the left internal mammary artery to the left anterior descending, and then  reverse saphenous vein graft to the diagonal coronary artery.  The patient had previously had stents placed by Dr. Johnsie Cancel in 2007.  Other problems include a history of reflux esophagitis, hypertension, hyperlipidemia, rosacea and gallstones.  PAST SURGICAL HISTORY: Past Surgical History  Procedure Laterality Date  . Coronary angioplasty with stent placement      Stent 2007  . Open heart surgery      01/19/2010  . Coronary artery bypass graft    . Cataract extraction, bilateral      bilateral caaract removal,  . Breast lumpectomy Left 02/06/11    FAMILY HISTORY Family History  Problem Relation Age of Onset  . Heart disease Brother   . Cancer Brother     gland cancer  . Diabetes Sister   . Breast cancer Sister   . Stroke Mother   . Stroke Father   . Colon cancer Neg Hx   . Stomach cancer Neg Hx   . Pancreatic cancer Neg Hx   . Prostate cancer Father   . Kidney disease Neg Hx   . Liver disease Neg Hx   The patient's father died at the age of 106 following a stroke.  The patient's mother died at 74 with a history of Alzheimer disease.  The patient had 2 sisters, one of whom developed breast cancer at the age of 57 and survives.  The patient has a brother who apparently had lymphoma.  He is also a survivor.  GYNECOLOGIC HISTORY:  (Reviewed 07/27/2013) Menarche age 34.  Menopause in the mid 1990s.  She never used hormone replacement.  She is a GX, P2.    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  Her husband, Donta Mcinroy,  has worked as a Dealer and is still very active in his shop.   Daughter, [name], lives in [place] and is a home school mom.  Daughter, [name],  lives in [place] and works as a Consulting civil engineer for the [employer].  The patient attends the [employer] in [place].   ADVANCED DIRECTIVES: Not in place  HEALTH MAINTENANCE:  (Updated 07/27/2013) History   Substance Use Topics  . Smoking status: Never Smoker   . Smokeless tobacco: Never Used  . Alcohol Use: No     Colonoscopy: 2011/[facility]  PAP: Not on file  Bone density: October 2014 at [facility], osteopenia with T score -1.4  Lipid panel: February 2014/[facility]  Allergies  Allergen Reactions  . Choline Fenofibrate Other (See Comments)     pt states INTOL to Trilipix w/ "thigh burning"  . Simvastatin Other (See Comments)     pt states INTOL to STATINS \T\ refuses to restart  . Hctz [Hydrochlorothiazide]     Causes hyponatremia  . Hydrocodone     Nightmare after taking cough syrup w/hydrocodone  . Levaquin [Levofloxacin In D5w]     Elevated BP  . Adhesive [Tape] Rash  . Ceclor [Cefaclor] Rash  . Clarithromycin Rash  . Codeine Nausea Only  . Doxycycline Rash  . Lisinopril Cough  Developed ACE cough...  . Penicillins Itching and Rash    At injection site  . Tobramycin-Dexamethasone Rash    Current Outpatient Prescriptions  Medication Sig Dispense Refill  . aspirin EC 81 MG tablet Take 81 mg by mouth daily.    . fexofenadine (ALLEGRA) 180 MG tablet Take 180 mg by mouth daily.      . fluocinonide (LIDEX) 0.05 % external solution Apply 1 application topically daily as needed (scalp itching).     . Glucosamine-Chondroit-Vit C-Mn (GLUCOSAMINE CHONDR 1500 COMPLX PO) Take 1 tablet by mouth 3 (three) times daily.      . hydrALAZINE (APRESOLINE) 50 MG tablet Take 1/2 tablet by mouth three times daily    . hyoscyamine (ANASPAZ) 0.125 MG TBDP disintergrating tablet Place 0.125 mg under the tongue every 6 (six) hours as needed for bladder spasms or cramping.    [name] ketoconazole (NIZORAL) 2 % shampoo Apply 1 application topically 2 (two) times a week. As needed for itching a couple of times week    . latanoprost (XALATAN) 0.005 % ophthalmic solution Place 1 drop into both eyes at bedtime.     [name] letrozole (FEMARA) 2.5 MG tablet Take 1 tablet (2.5 mg total) by mouth daily. 90 tablet 3  .  losartan (COZAAR) 100 MG tablet Take 0.5 tablets (50 mg total) by mouth 3 (three) times daily. Take 1/2 tablet by mouth three times daily 45 tablet 5  . magnesium oxide (MAG-OX) 400 MG tablet Take 400 mg by mouth daily.    . metoprolol (LOPRESSOR) 50 MG tablet Take 1 tablet (50 mg total) by mouth 3 (three) times daily. 90 tablet 1  . mometasone (NASONEX) 50 MCG/ACT nasal spray Place 2 sprays into the nose daily as needed (congestion).     . nitroGLYCERIN (NITROSTAT) 0.4 MG SL tablet Place 1 tablet (0.4 mg total) under the tongue every 5 (five) minutes as needed for chest pain. (Patient not taking: Reported on 06/29/2014) 25 tablet 3  . Omega-3 Fatty Acids (FISH OIL) 1200 MG CAPS Take 1,200 mg by mouth daily.     [name] omeprazole (PRILOSEC) 40 MG capsule Take 1 capsule (40 mg total) by mouth daily. 30 minutes before 1st meal of the day 30 capsule 6  . PRESCRIPTION MEDICATION Vitamin B 2    . ranitidine (ZANTAC) 300 MG capsule Take 1 capsule (300 mg total) by mouth every evening. (Patient not taking: Reported on 06/29/2014) 30 capsule 3  . tiZANidine (ZANAFLEX) 2 MG tablet Take 2 mg by mouth at bedtime.    . traMADol (ULTRAM) 50 MG tablet Take 1 tablet (50 mg total) by mouth 3 (three) times daily as needed. 90 tablet 5  . vitamin B-12 (CYANOCOBALAMIN) 1000 MCG tablet Take 1,000 mcg by mouth daily.       No current facility-administered medications for this visit.    OBJECTIVE:  Older white woman who appears  stated age 26 Vitals:   08/02/14 1335  BP: 150/67  Pulse: 67  Temp: 97.9 F (36.6 C)  Resp: 18     Body mass index is 29.16 kg/(m^2).    ECOG FS: 1 Filed Weights   08/02/14 1335  Weight: 134 lb 12.8 oz (61.145 kg)   Sclerae unicteric, pupils round and equal Oropharynx clear and moist-- no thrush or other lesions No cervical or supraclavicular adenopathy Lungs no rales or rhonchi Heart regular rate and rhythm Abd soft, nontender, positive bowel sounds MSK no focal spinal tenderness, no  upper extremity lymphedema Neuro:  nonfocal, well oriented, appropriate affect Breasts: The right breast is unremarkable. The left breast is status post lumpectomy and radiation. There is no evidence of local recurrence. The left axilla is benign.   LAB RESULTS: Lab Results  Component Value Date   WBC 7.8 08/02/2014   NEUTROABS 2.7 08/02/2014   HGB 13.0 08/02/2014   HCT 38.5 08/02/2014   MCV 90.6 08/02/2014   PLT 281 08/02/2014      Chemistry      Component Value Date/Time   NA 136 08/02/2014 1258   NA 133* 05/16/2014 0326   K 4.4 08/02/2014 1258   K 4.3 05/16/2014 0326   CL 95* 05/16/2014 0326   CL 95* 07/28/2012 0941   CO2 29 08/02/2014 1258   CO2 30 05/16/2014 0326   BUN 18.1 08/02/2014 1258   BUN 13 05/16/2014 0326   CREATININE 0.7 08/02/2014 1258   CREATININE 0.71 05/16/2014 0326      Component Value Date/Time   CALCIUM 9.5 08/02/2014 1258   CALCIUM 9.2 05/16/2014 0326   ALKPHOS 84 08/02/2014 1258   ALKPHOS 81 05/04/2014 0850   AST 21 08/02/2014 1258   AST 26 05/04/2014 0850   ALT 22 08/02/2014 1258   ALT 22 05/04/2014 0850   BILITOT 1.07 08/02/2014 1258   BILITOT 1.1 05/04/2014 0850      STUDIES:  Repeat mammography October 2016 pending  Most recent bone density at [facility] on 12/22/2012 showed osteopenia with a T score of -1.4.   ASSESSMENT: 79 y.o. [place] woman   (1)  status post left lumpectomy and sentinel lymph node biopsy December of 2012 for a T2 N0, stage IIA invasive ductal carcinoma, grade 3, HER-2 not amplified, strongly estrogen and progesterone receptor positive with an elevated proliferation marker at 72%;   (2) Oncotype recurrence score of 21, in the intermediate range, indicating a distant disease recurrence rate of 13% within 10 years if the patient's only systemic adjuvant therapy was tamoxifen for 5 years.   (2)  She completed radiation in March of 2013 and started letrozole at that time.  PLAN: [name] is now 3-1/2 years out from her  definitive surgery and 3 years into her 5 years of antiestrogen therapy. She is tolerating the treatment well. The plan is to continue letrozole for an additional 2 years at which point she will "graduate" from breast cancer follow-up.  In particular I do not think she would be a good candidate for a total 10 years of letrozole, given her comorbidities and concerns regarding bone density  She will have a repeat bone density this October at the same time as her mammography. She will see [place] again in one year. If there has been significant bone loss noted on repeat DEXA scan we will consider zolendronate versus denosumab at that time.   Chauncey Cruel, MD  07/27/2013

## 2014-08-09 DIAGNOSIS — M9901 Segmental and somatic dysfunction of cervical region: Secondary | ICD-10-CM | POA: Diagnosis not present

## 2014-08-09 DIAGNOSIS — R51 Headache: Secondary | ICD-10-CM | POA: Diagnosis not present

## 2014-08-09 DIAGNOSIS — M542 Cervicalgia: Secondary | ICD-10-CM | POA: Diagnosis not present

## 2014-08-09 DIAGNOSIS — M5412 Radiculopathy, cervical region: Secondary | ICD-10-CM | POA: Diagnosis not present

## 2014-08-16 ENCOUNTER — Encounter: Payer: Self-pay | Admitting: Pulmonary Disease

## 2014-08-16 ENCOUNTER — Ambulatory Visit (INDEPENDENT_AMBULATORY_CARE_PROVIDER_SITE_OTHER): Payer: Medicare Other | Admitting: Pulmonary Disease

## 2014-08-16 VITALS — BP 126/80 | HR 66 | Temp 97.2°F | Wt 134.0 lb

## 2014-08-16 DIAGNOSIS — I10 Essential (primary) hypertension: Secondary | ICD-10-CM | POA: Diagnosis not present

## 2014-08-16 DIAGNOSIS — K589 Irritable bowel syndrome without diarrhea: Secondary | ICD-10-CM

## 2014-08-16 DIAGNOSIS — R519 Headache, unspecified: Secondary | ICD-10-CM

## 2014-08-16 DIAGNOSIS — J3089 Other allergic rhinitis: Secondary | ICD-10-CM | POA: Diagnosis not present

## 2014-08-16 DIAGNOSIS — R51 Headache: Secondary | ICD-10-CM

## 2014-08-16 DIAGNOSIS — IMO0001 Reserved for inherently not codable concepts without codable children: Secondary | ICD-10-CM

## 2014-08-16 DIAGNOSIS — M8580 Other specified disorders of bone density and structure, unspecified site: Secondary | ICD-10-CM

## 2014-08-16 DIAGNOSIS — M150 Primary generalized (osteo)arthritis: Secondary | ICD-10-CM

## 2014-08-16 DIAGNOSIS — M4302 Spondylolysis, cervical region: Secondary | ICD-10-CM

## 2014-08-16 DIAGNOSIS — M159 Polyosteoarthritis, unspecified: Secondary | ICD-10-CM

## 2014-08-16 DIAGNOSIS — M791 Myalgia: Secondary | ICD-10-CM

## 2014-08-16 DIAGNOSIS — E782 Mixed hyperlipidemia: Secondary | ICD-10-CM

## 2014-08-16 DIAGNOSIS — I2510 Atherosclerotic heart disease of native coronary artery without angina pectoris: Secondary | ICD-10-CM | POA: Diagnosis not present

## 2014-08-16 DIAGNOSIS — M609 Myositis, unspecified: Secondary | ICD-10-CM

## 2014-08-16 DIAGNOSIS — R0989 Other specified symptoms and signs involving the circulatory and respiratory systems: Secondary | ICD-10-CM

## 2014-08-16 DIAGNOSIS — K219 Gastro-esophageal reflux disease without esophagitis: Secondary | ICD-10-CM

## 2014-08-16 DIAGNOSIS — K5730 Diverticulosis of large intestine without perforation or abscess without bleeding: Secondary | ICD-10-CM

## 2014-08-16 DIAGNOSIS — J301 Allergic rhinitis due to pollen: Secondary | ICD-10-CM | POA: Diagnosis not present

## 2014-08-16 NOTE — Patient Instructions (Signed)
Today we updated your med list in our EPIC system...    Continue your current medications the same...  We will set up a referral to a hand orthopedic specialist regarding the cyst on your right index finger...  Call for any questions...  Let's plan a follow up visit in 63mo, sooner if needed for problems.Marland KitchenMarland Kitchen

## 2014-08-16 NOTE — Progress Notes (Signed)
Subjective:    Patient ID: [name], female    DOB: 1936/02/02, 79 y.o.   MRN: [id number]  HPI 79 y/o WF here for a follow up visit... she has multiple medical problems including AR & Asthma;  HBP;  CAD followed by [name] & s/p 3 vessel CABG 11/11 by [name];  Hyperlipidemia followed in the [facility];  GERD/ IBS/ Divertics;  Breast Cancer diagnosed 12/12;  Fibromyalgia;  Chr HAs & dizziness...  SEE PREV EPIC NOTES FOR OLDER DATA >>    EKG 9/13 showed NSR, rate64, NSSTTWA, NAD...   CXR 10/13 showed heart at upper lim of norm, prior CABG, mild hyperinflation, clear, NAD...  Abd Ultrasound 10/13 by [name] for eval CP showed> norm GB, liver, kidneys, & Ao- neg sonar...  LABS 10-12/13 showed Chems- wnl;  CBC- wnl  LABS 2/14:  FLP- ok x LDL=110;  TSH=1.33   CXR 4/14 showed norm heart size, s/p CABG, tort thor Ao, bilat apical pleural scarring, NAD.[name].  ~  February 03, 2013:  37mo ROV & [name] had Rheum consult [name]- last seen 9/14 & note reviewed> neck pain, this exac HAs/migraines, dizzy/ light headed; MRI=> NS consult & not a surg cand & NSAIDs/ PT w/ min benefit; saw Neurology- tried Pamelor but feels hung-over; for her DDD & OA they rec Flex5mg Tid which has helped some & she requests refill today- OK...     She saw [name] 9/14 after ER visit for HA> hx migraines and mixed HAs; CT Brain was neg & MRI CSpine showed multilevel DDD & some foraminal stenosis; Labs were neg including CRP & Sed; they tried Nortrip & Mobic but only min benefit...    She had routine f/u [name] 10/14> Hx left breast cancer dx 10/12 & treated w/ lumpectomy, XRT, & now Femara; exam neg, f/u mammogram neg, rec f/u 91yr... We reviewed prob list, meds, xrays and labs> see below for updates >> BP remains well controlled on her 3 meds; she had the 2014 flu vaccine 10/14; she continues on allergy shots at the [facility]...  ~  August 11, 2013:  43mo ROV & [name] reports that she is stable overall just lack energy so she's  eating extra protein to help w/ this she tells me... She has had numerous specialty follow up visits in the interval>>    She had an allergy f/u w/ [name] 3/15> AR on allergy shots, Allegra, Flonase, Saline; she is stable w/o recent exacerbation...    Hx Asthma on XopenexHFA as needed; she has done well w/o asthma exac, cough, sput, wheezing, or SOB...     She had a Cards f/u w/ [name] 1/15> HBP, CAD, HL; on ASA81, Metop50Bid, Hyzaar100-12.5, Imdur30; Hx neg Myoview 5/13 & doing satis- no changes made; BP today= 136/68 & she denies CP, palpit, SOB, dizzy, edema, etc...    Lipids controlled on diet alone (w/ FishOil & CoQ10); Last FLP was 2/14 showing TChol 194, TG 143, HDL 55, LDL 110; reminded of diet, exercise, & need for Fasting blood work...    She has seen [name] for rheum and [name] for NS in the past> told to f/u prn only...    She had a Neuro f/u 1/15> HAs followed by [name]; neg CTBrain & DDD on MRI Cspine; she refused to take Pamelor & uses Tramadol/ Flexeril as needed; no change in meds needed...     She saw Oncology 6/15 for f/u of her left Breast Cancer> on Femara2.5 & tol well, s/p lumpectomy & sentinel  node bx 12/12, followed by XRT finished 3/13, & on Letrozole since then, no known recurrence...  We reviewed prob list, meds, xrays and labs> see below for updates >>   ~  December 21, 2013:  105moROV & add-on requested for bronchitic exacerbation> [name] Dintells me that she noted the onset of cough, chest congestion, hoarse voice 7 min sore throat ~4d ago; she denied f/c/s but has chest soreness from the coughing 7 bringing up sm amt of yellow-green sput; of note she was recently (3wks ago) treated for a UTI (Proteus sens Cipro) & this resolved... Exam shows borderline BP= 160/78, otherw stable VS w/ O2sat95% on RA, sl pharyngeal erythema, no exudates, ears neg, no adenopathy, hoarse voice, chest exam w/ bilat rhonchi & exp wheezing... CXR shows norm heart size, s/pCABG, clear lungs w/o  infiltrates... We discussed Rx w/ Depo80, Levaquin500 x7d, Mucinex 600-2Bid, Fluids, MMW & Delsym prn...     We reviewed prob list, meds, xrays and labs> see below for updates >>   CXR 10/15 showed normal heart size, s/p CABG, unchanged biapical pleural scarring, otherw clear lungs w/o infiltrates, NAD...   ~  December 30, 2013:  1wk ROV & post hosp check>  [name]/ HA, HBP, & hyponatremia (Na=119 ?etiology)> she had recently been treated for bronchitic exac w/ Levaquin & Mucinex, she thinks the HBP was a reaction "allergy" to Levaquin & doesn't want to take this antibiotic again; BP was 220 range in ER &treated w/ Cardene drip=> she was actually disch on less meds than PTA=> sdhe returned to ER the day after disch w/ HBP & BP ~200 & she hadn't taken her Losartan yet, given IV hyralazine & BP improved to the 160s; now on Metop50Bid & Cozaar50 (prev on Hyzaar100-12.5); they suspected her diuretic in her hyponatremia which corrected w/ IVF; she has a hx of HAs from DJD/neck and FM; We reviewed the following medical problems during today's office visit >>     AR/ Asthma>  not on any inhaled meds at present; denies cough, sput, ch in dyspnea, etc; she has Allegra, Nasonex, Mucinex, Tessalon, MMW for her various symptoms...    HBP>  on Metop50Bid & Losar50 now w/ BP=146/82 today; denies visual changes, CP, palipit, dizziness, syncope, dyspnea, edema, etc; we decided to incr Losartan to 100 & add Hydralazine50-1/2Bid w/ ROV 2 wks...    CAD>  CP 11/11=> cath w/ in-stent restenosis, then CABG x3 by [name]; re-hospitalized 6/12 by Cards for CP> cath showed severe 3 vessel CAD w/ 3/3 grafts patent & norm LVF w/ EF= 65-70%; Myoview 5/13 showed no ischemia & EF>80%; no change in meds- on Imdur30; & [name] noted that med rx is lim by side effects.    Hyperlipid>  Intol to statins; [name] FLP on Cres10- 2d per week reviewed but she was unable to tolerate even this low  dose; off all meds on diet alone now...    GI- GERD, Divertics> on Omep40 + Levsin0.125 & Align; followed by DrPatterson & seen 9/13 f/u for ?diverticulitis- improved after Cipro/Flagyl but CTAbd was neg x diverticulosis; he rec high fiber diet & Citrucel...    Hx left breast cancer> she had f/u Oncology 12/13- on Femara2.5 & tol ok; last mammogram 10/13 was ok as well...    Others>  she has persistant FM symptoms and chr HAs, uses Tramadol & Advil Prn... We reviewed prob list, meds, xrays and labs> see below  for updates >>   CXR 10/15 showed fe chr changes in lungs, norm heart size & s/p CABG, DJD in spine, NAD.Marland KitchenMarland Kitchen  EKG 11/15 showed NSR, rate69, NSSTTWA, NAD...  Lab cumulative summary sheet reviewed...  PLAN>> we decided to incr Losartan to '100mg'$ /d and add Hydralazine50-1/2Bid, continue Metop50Bid and the Imdur30 as before; she knows to aqvoid excess sodium in her diet (esp now that hyponatremia resolved & she's off diuretic; ROV 2wks...    ~  January 14, 2014:  2wk ROV & another hospitalization in the interval> [name] has had a continuing saga of HA & HBP, she has been to the ER, to Cardiology, Hospitalized, back to Cardiology, and almost daily phone calls for HA & HBP w/ BP ~200 sys> all notes reviewed;  Cards eval for secondary cause of the HBP has been neg;  Initially it was assumed that incr BP resulted in HBP but she has a hx of chronic daily HAs and she continues to have HAs when her BP is controlled, and it seems more likely that the pain contributes to her HBP;  She was last Hosp 11/11 - 01/06/14 and disch on ASA81, Imdur60, Hydralazine50Tid, Losar100-1/2Bid, Metop50Bid;  BP at disch was 100/47;  She saw [name] 11/16 on these meds w/ BP recorded 130/64, Imdur was stopped due to her HAs, they plan f/u 56mo  She has called daily since then w/ elev BP (esp at night she says) and we have rec strategies using her current meds to help her headaches and adjust BP meds:    HBP> Rec to adjust meds-  take Metop50Tid, Losartan100-1/2Tid, Apresoline50- take 1/2 to 1tab Tid (she wants to do this since BP is low in AM & she feels weak, with BP elev in PM & she needs more)...    Chronic Daily HAs> she needs f/u appt w/ Neurology w/ their active management of this problem; she has Zanaflex'2mg'$  & Cyclobenzaprine'5mg'$ - asked to try one of these taken Tid & if not improved then try the other Tid; rest, use heating pad on neck, and take Tramadol/ Tylenol up to 3 times daily as needed... We reviewed prob list, meds, xrays and labs> see below for updates >>   CXR 11/15 showed norm heart size, prev CABG, clear lungs- some hyperinflation/ NAD, DJD Tspine...  Renal Sonar 11/15 was wnl...  CT Head 11/15 showed cortical atrophy and small vessel dis, sinuses clear, NAD..Marland KitchenMarland Kitchen  EKG 11/15 showed NSR, rate64, borderline tracing w/?LAE & min NSSTTWA...  2DEcho 11/15 showed norm cavity size & thickness, norm EF= 60-65%, norm wall motion, norm diastolic function, essentially norm valves...   Myoview 11/15 showed low risk scan w/ abn EKG portion but no stress induced perfusion defects, hyperdynamic LVF w/ EF>70% & no regional wall motion abn  LABS 11/15:  Chems- wnl w/ Cr=0.6;  CBC- ok w/ Hg= 12.3;  TSH=1.60 PLAN>>  take Metop50Tid, Losartan100-1/2Tid, Apresoline50- take 1/2 to 1tab Tid (she wants to do this since BP is low in AM & she feels weak, with BP elev in PM & she needs more); needs f/u appt w/ Neurology w/ their active management of this problem; she has Zanaflex'2mg'$  & Cyclobenzaprine'5mg'$ - asked to try one of these taken Tid & if not improved then try the other Tid; rest, use heating pad on neck, and take Tramadol/ Tylenol up to 3 times daily as needed...  ~  February 04, 2014:  3wk ROV & [name] Dinreports improved overall;  BP is better regulated but still sl volatile &  she like the ability to adjust the Hydralazine> on Metop50Tid, Cozaar100- 1/2 tid, & Hydralazine50- 1/2to1Tid;  BP today= 144/78 and when it was up>160/90  last PM she took an extra '25mg'$  dose...  She saw Neuro [name] for her chronic daily HAs 11/15 and she agreed w/ our Rx for BP control + Muscle relaxer (she prefers the Zanaflex over the Flexeril) + Tramadol/ Tylenol for pain...  She is c/o a jitteriness/ shaking on the inside feeling and we discussed trying a regular medication to try to help elim this sensation> try KLONOPIN 0.'5mg'$ - 1/2 to 1 tab Bid... She will f/u in 10mo& call sooner prn problems...  ADDENDUM>> she never filled the Klonopin and refuses anxiolytic meds.  ~  April 13, 2014:  268moOV & PaFraser Dinas had numerous medical follow up visits over the last 63m47mo  She saw GI 12/15 w/ lower abd pain & nausea similar to prev bouts of diverticulitis in past;  CT Abd&Pelvis showed mild sigmoid diverticulitis, no evid of abscess or complications;  She was treated w/ Cipro & Flagyl & improved;  She is maintained on Prilosec, Anaspaz, fiber & metamucil...     She saw Neuro, [name] 1/16 due to headaches> long hx migraines, recurrent prob since 2014, now 15/30 days per month, CT Brain was neg x atrophy & sm vessel dis, MRI Cspine w/ multilevel DDD etc; treated w/ Tramadol, Flexeril, Zanaflex and [name] has ordered a Sleep Study- still pending & sched at end of this month...     She also saw [name] for CARDS f/u 1/16> hx CAD, CABG 2011, HBP, HLD; Adm 10/15 w/ HBP & hyponatremia (Na=119), HCTZ was stopped, BP meds adjusted & now controlled on Metoprolol50Tid, Losar100-1/2Tid, Apres50-1/2Tid; BP today= 136/80, improved & she denies recent CP, palpit, dizzy, ch in SOB, edema;  2DEcho 11/15 showed norm LVF w/ EF=60-65% & no regional wall motion abn, trivial AI, otherw wnl; Lexiscan Myoview 11/15 showed low risk scan w/ abn EKG portion but no ischemic perfusion defects & hyperdynamic LV w/ EF=70% & no wall motion abn... We reviewed prob list, meds, xrays and labs> see below for updates >> we gave her the PREVNAR-13 today... [name] PLAN>> she will continue her same meds and plan f/u in ~44mo66mofasting blood work at that time...   ~  May 03, 2014:  3wk ROV & pt called requesting urgent add-on appt for recurrent BP problems> Pt notes that BP has been well controlled on her regimen of low sodium, Metoprolol50Tid, Hydralazine50-1/2Tid, and Losartan100-1/2Tid> until 3d ago when she had the onset of another severe HA- assoc w/ the severe pain she noted that her BP was once again elevated (190-230/ 100-110 she says) & the usual doses of the 3 meds didn't bring it down; she tried taking the Tramadol50 as directed by [name] & added Tylenol plus her musc relaxers Flexeril & Zanaflex but the HA persisted; when she called [name]'s office she was directed to call us [place] an appt because of the BP... NOTE> it has been determined by her Cardiologist [name] & I concur that in her case it's the HA PAIN that causes her BP to spike, and relief of the pain is paramount to controlling the BP in these instances...  In this regard she is directed to maintain a baseline BP regimen w/ Metoprolol50Tid, Apres50-1/2Tid & Losar100-1/2Tid;  But when she has a HA she will immediately start her Neurology action plan for  her severe headaches, and check her BP Tid w/ adjustment of her meds up to one of each tab Tid until the HA is resolved, then return to her baseline regimen... We reviewed this plan in detail & she will let us know if she has any questions...   CXR 3/16 showed norm heart size, s/p CABG, clear lungs, NAD.Marland KitchenMarland Kitchen   EKG 3/16 showed SBrady, rate53, NSSTTWA, NAD... PLAN>> continue baseline BP med regimen w/ METOPROLOL50Tid, APRESOLINE50-1/2Tid & LOSARTAN100-1/2Tid; continue to monitor BP at home; during episodes of severe HA pain- follow Neurologists HA action plan for control of the discomfort; in addition start to monitor BP 3 times daily & increase BP meds to 1 of each Tid until the HA is controlled & the BP returns to normal; then resume the  baseline regimen...  ~  August 16, 2014:  3-28mo ROV & [name] has continued w/ her regular array of medical specialist follow up visits> SEE BELOW    Asthma & allergies controlled on Allegra & Nasonex, no asthma attacks etc...    BP is doing better on current Metop50Tid, Losar100-1/2Tid, Apres50-1/2Tid w/o any elev readings at home...     She has had several follow up visits w/ GI> hx GERD, Divertics, IBS, Polyps> last bout of diverticulitis was 12/15 treated w/ Cipro & Flagyl; she recurrent pain 3/16- seen in ER where CT was neg but she improved w/ Antibiotic rx; she continues on Omep & they added Ranitadine; she's been on Levsin & they added benefiber; she had Ba swallow (neg), EGD (wnl) & Colon (divertics and 2 sessile polyps) by DrStark...     She saw her oncologist, DrMagrinat 6/16 for f/u of her breast cancer> Dx 10/12 & treated w/ lumpectomy & sentinel node bx, followed by XRT, the Femara for a planned 39yrs (2 more yrs to go); they follow her BMDs etc...     She has a small cyst on her right index finger & I have suggested an Ortho/Hand eval, "I might want to see Rheumatology again" she says...    She's had an extensive Neuro eval from [name] & had a Neurology 2nd opinion consult 5/16 w/ DrDKirby at [facility] in [place]-  "it's my neck" and she states nothing new was discovered "they didn't find anything"; she has Tramadol & Tylenol for pain... We reviewed prob list, meds, xrays and labs> see below for updates >>   Barium Swallow was neg- norm motility, no HH, etc...  EGD 5/16 by DrStark showed essentially normal endoscopy; rec to continue antireflux regimen & PPI Rx...  Colonoscopy 5/16 by DrStark showed diverticulosis, 2 sessile polyps (one tub adenoma, the other hyperplastic)...  LABS 6/16: Chems- ok x BS=147;  CBC- wnl;   PLAN>>  She is rec to continue her current med regimen and follow up w/ her specialists...           Problem List:  GLAUCOMA (ICD-365.9) - she is sched for right  cataract surg by DrBevis in Feb...  ALLERGIC RHINITIS (ICD-477.9) - she uses Allegra & Nasonex Prn; on Allergy shots weekly x yrs... ~  She is followed by [name] at the [place] [facility] asthma[facility] sinus [facility] facility=> she remains on allergy shots...  ASTHMA & Asthmatic Bronchitis >> off Symbicort & using XOPENEX HFA as needed rescue inhaler... ~  5/11:  notes incr chest symptoms in the heat & rec to take the Symbicort 2sp Bid... ~  2012:  After her CABG & repeat hosp by Cards she is noted to  be off her inhalers... ~  10/12:  Treated w/ Zpak, Pred, Mucinex, plus her Symbicort80 & improved... ~  CXR 5/13 showed s/p CABG, clear lungs, NAD (she went to the Er w/ cough & had neg eval). ~  7/13:  [name] stopped her Symbicort & switched to [name] HFA as needed... ~  2/14: she does not list any inhaled meds at present & states breathing OK w/o exac... ~  4/14: she was hosp after pill asp w/ asthma exac & improved w/ supportive Rx, didn't require bronch, CXR was clear, grad improved post hosp... ~  CXR 4/14 showed normal heart size, s/p CABG, sl tort Ao, bilat apic pleural scarring, NAD...  ~  6/15: she is stable w/o asthma exac and has avoided URIs/ bronchitis/ etc... ~  10/15: presented w/ URI/ bronchitic exac w/ cough, yellow-green sput, congestion, hoarseness, etc; CXR w/o signs of pneumonia & treated w/ Depo, Levaquin, Mucinex, MMW, Delsym, etc...  ~  CXR 10/15 showed few chr changes in lungs, norm heart size & s/p CABG, DJD in spine, NAD  HYPERTENSION (ICD-401.9) - controlled on TOPROL 50mg Bid & HYZAAR 100-12.5 daily (off Lisinopril due to ACE cough)...  ~  8/12: BP=  128/84 & tol meds well; denies HA, fatigue, visual changes, CP, palipit, syncope, dyspnea, edema, etc... ~  2/13: BP= 140/64 & she remains essentially asymptomatic on Rx... ~  8/13:  BP= 132/64 & she denies CP, palpit, SOB, edema, etc... ~  2/14: controlled on Toprol & Hyzaar w/ BP 144/72 today; denies visual changes, CP,  palipit, dizziness, syncope, dyspnea, edema, etc... ~  5/14:  BP= 136/60 & she remains stable... ~  8/14:  BP= 134/60 and she denies CP, palpit, dizzy, SOB, edema... ~  12/14: on Metop50Bid, Hyzaar 100-12.5, Imdur30;  BP= 140/80 & she remains largely asymptomatic... ~  6/15: on ASA81, Metop50Bid, Hyzaar100-12.5, Imdur30; BP today= 136/68 & she denies CP, palpit, SOB, dizzy, edema, etc... ~  10/15: on same meds w/ BP= 160/78 & no changes meds- advised to monitor BP at home once she's over the URI illness, take meds regualrly... ~  11/15: post Hosp for HBP, hyponatremia; they stopped diuretic & disch on Metop50Bid & Losar50; she went back to ER the next day w/ BP~200 (hadn't taken her Losartan) & given IV Hydralazine w/ improvement to 160s; we decided to continue the BBlocker, increase the ARB to 100mg /d, and ADD HYDRALAZINE50- 1/2Bid for now; ROV 2 weeks... ~  11/15: she is s/p ER, Hosp, Cards visits & mult phone calls (see above)> meds ajusted- take Metop50Tid, Losartan100-1/2Tid, Apresoline50- 1/2 to 1tab Tid (she wants to do this since BP is low in AM & she feels weak, with BP elev in PM & she needs more)... ~  2/16: BP meds adjusted over the last few months & BP controlled on Metoprolol50Tid, Losar100-1/2Tid, Apres50-1/2Tid; BP today= 136/80, improved & she denies recent CP, palpit, dizzy, ch in SOB, edema... ~  3/16: pt presented w/ BP elev during recurrent bouts of HA pain- asked to increase her above meds to Metop50/ Losar100/ Apres50- at her Tid dosing intervals when needed for HBP while her HA meds are kicking in to resolve her pain; if her pain doesn't resolve & her BP does not improve, then she knows to go to the ER for immediate assessment...   CORONARY ARTERY DISEASE (ICD-414.00) - on ASA 81mg /d (intol to 325 she says) & off Plavix per [name]...  Cardiolite 9/08 showed CP & HBP response, EF+85%... had cath 10/08  w/ single vessel dis w/ mod ostial LAD stenosis and patent LAD stent, normal  LVF.[name]. option for med Rx vs off pump LIMA... she notes some CP/ pressure "burning" when walking up a certain hill during her daily walks, but states this is stable and "no worse"... ~  Myoview 9/09 was neg- no scar or ischemia, EF= 89%, hypertensive response- Lisinopril10 added. ~  f/u Myoview 3/11 was neg- no scar or ischemia, EF= 86%, norm BP response, fair exerc capacity. ~  saw [name] 5/11- no CP, he stopped her Plavix, stable- continue other meds same. ~  She had recurrent CP 11/11 w/ in-stent restenosis on cath> subseq 3 vessel CABG by DrGearhart & doing well since then... ~  6/12:  Hosp w/ CP & repeat cath showed CAD & 3/3 grafts patent, EF= 65-70% ~  Myoview 5/13 was felt to be a normal stress nuclear study- normal wall motion, no ischemia, EF=84% ~  2/14: CP 11/11=> cath w/ in-stent restenosis, then CABG x3 by [name]; re-hospitalized 6/12 by Cards for CP> cath showed severe 3 vessel CAD w/ 3/3 grafts patent & norm LVF w/ EF= 65-70%; Myoview 5/13 showed no ischemia & EF>80%; no change in meds; & [name] noted that med rx is limited by side effects... ~  8/14 & 12/14: she remains stable on Imdur30 + Metop50Bid 7 Hyzaar100-12.5; denies CP, palpit, SOB, etc... ~  6/15: Cards f/u w/ [name] 1/15> HBP, CAD, HL; on ASA81, Metop50Bid, Hyzaar100-12.5, Imdur30; Hx neg Myoview 5/13 & doing satis- no changes made; BP today= 136/68 & she denies CP, palpit, SOB, dizzy, edema, etc ~  EKG 11/15 showed NSR, rate69, NSSTTWA, NAD  ~  2DEcho 11/15 showed  norm LVF w/ EF=60-65% & no regional wall motion abn, trivial AI, otherw wnl. ~  Myoview 11/15 showed low risk scan w/ abn EKG portion but no stress induced perfusion defects, hyperdynamic LVF w/ EF>70% & no regional wall motion abn. ~  11/15: she is off the Imdur per [name] due to HAs... ~  1/16: she had f/u [name]> stable on meds and no changes made...  HYPERLIPIDEMIA (ICD-272.4) - on CRESTOR 10mg - 1/2 on M&F, FISH OIL 1200mg  Tid + Flax Seed  Oil... she has been intol to statins in the past>  tried low dose Trilipix but stopped this due to "thigh burning">  now followed in the Lipid Clinic--- ~  [place] 7/08 showed TChol 217, TG 236, HDL 46, LDL 150...  ~  [place] 6/09 showed TChol 233, TG 263, HDL 46, LDL 150... she agrees to try LOW DOSE Trilipix. ~  FLP 11/09 showed TChol 162, TG 167, HDL 45, LDL 83... ~  [place] 11/10 showed TChol 240, TG 271, HDL 45, LDL 150... LipClin Rx w/ FishOil & FlaxSeedOil. ~  FLP 2/11 showed TChol 285, TG 156, HDL 79, LDL 180... LC is aware & working w/ her regularly. ~  [place] 5/11 showed TChol 277, TG 241, HDL55, LDL 193...  LC added Cres10 just 2d per week. ~  FLP 10/11 showed TChol 198, TG 215, HDL 53, LDL 109... much improved on Cres10 twice weekly. ~  She continues to f/u w/ Lipid clinic & they titrated up the Cres10 on M&F, plus 5mg  on Wednes> ~  FLP  10/12 was the best yet w/ TChol 167, TG 203, HDL 54, LDL 88...  ~  [place] 4/13 showed TChol 193, TG 176, HDL 57, LDL 101 ~  She is still followed in the [facility]- note 6/13 reviewed (at that time  on Cres10 on M&F, 5mg  on W) but she subseq cut herself down to 1/2 on M&F due to nausea & aching. ~  FLP 2/14 on diet alone showed TChol 194, TG 143, HDL 55, LDL 110... We reviewed diet, exercise, etc... ~  She remains off the [place] on diet alone and she declines to restart low dose rx "I can't tolerate them"  GERD (ICD-530.81) - on OMEPRAZOLE 40mg /d... last EGD by DrPatterson 8/07 revealed sm HH, & gastritis and dilatation done... (RUT=neg, PPI Rx)... LEVSIN 0.125mg  helps her esoph spasm... Prn Phenergan for nausea.  DIVERTICULOSIS, COLON (ICD-562.10) & IRRITABLE BOWEL SYNDROME (ICD-564.1) ~  colonoscopy 2/03 by DrPatterson showed divertics and 5 mm polyp... ~  colonoscopy 3/11 showed divertics & cecal polyp= tubular adenoma, w/ f/u planned 59yrs. ~  9/13: seen by DrPatterson f/u for ?diverticulitis- improved after Cipro/Flagyl but CTAbd was neg x diverticulosis; he rec high fiber  diet & Citrucel...  ? GALLSTONES>>  Diagnosed by DrPatterson and referred to [facility] DrWilson, pt asked to call prn fopr incr symptoms to consider surg... ~  Abd Ultrasound 10/13 by [name] for eval CP showed> norm GB, liver, kidneys, & Ao- neg sonar.  BREAST CANCER >> Abn mammogram 10/12 showing a left breast nodule & subseq surg (partial mastectomy & sentinel node bx) by [name] 12/12 that proved to be an invasive ductal carcinoma, 2.3cm size, w/ neg lymphovasc invasion, clear margins (but close at 76mm), & neg nodes; she had XRT by DrKindard (had her 11th treatment today); and Oncology eval by DrMagrinat who plans hormone Rx later (no chemo she says)...  ~  2/13: She is tolerating treatment well & spirits are up, [name] had to aspirate a seroma & it is now resolved... ~  She is followed by DrMagrinat, Margot Chimes, [place]- now on [name] [place] 2.5mg /d w/ 72yrs planned... ~  6/14:  She had f/u DrMagrinat 6/14> his note is reviewed- s/p lumpectomy, sentinel node, XRT; plan is to continue Femara2.5mg  thru 3/16 for 77yrs rx. ~  10/14:  She had routine f/u [name] 10/14> Hx left breast cancer dx 10/12 & treated w/ lumpectomy, XRT, & now Femara; exam neg, f/u mammogram neg, rec f/u 69yr. ~  6/15:  She saw Oncology for f/u of her left Breast Cancer> on Femara2.5 & tol well, s/p lumpectomy & sentinel node bx 12/12, followed by XRT finished 3/13, & on Letrozole since then, no known recurrence...   DJD, Neck Pain >> ~  XRays Cspine 4/14 in hosp showed multilevel cerv spondylosis, 1-59mm anterolisthesis C4 on C5, DDD, foraminal stenosis at C5-6...  ~  5/14:  MRI Cspine => multilevel facet hypertrophy, foraminal narrowing & stenosis, disc osteophyte complexes, some central canal stenosis=> she was sent to NS, [name] who rec Mobic & PT (helped alittle) but she was not pleased... ~  8/14:  She is requesting a Rheumatology 2nd opinion about her neck discomfort=> seen by John Dempsey [name]- neck pain, this exac HAs/migraines, dizzy/ light  headed; MRI=> NS consult & not a surg cand & NSAIDs/ PT w/ min benefit; saw Neurology- tried Pamelor but feels hung-over; for her DDD & OA they rec Flex5mg Tid which has helped some...  FIBROMYALGIA (ICD-729.1) - she c/o chr fatigue, aching/ sore, on Tramadol & Tylenol, but most benefit from low dose Hydrocodone ~1/2 tab Prn... I have recommended an increase exercise program to her... ~  5/11:  Vit D level = 50 on 50000 u weekly by Helene Shoe, NP  HEADACHE (ICD-784.0) - eval at [facility] clinic in 2002, but  she notes Rx didn't help... ~  3/11:  presented w/ muscle contraction HA's> Rx Tramadol 50mg  Q6H Prn. ~  She has persistent/ recurrent HAs... ~  11/15: her chronic daily HAs have again risen in improtance due to interaction w/ her HBP> asked to f/u w/ Neuro ASAP & Rx w/ rest, heating pad, Tramadol50/ Tylenol, and musc relaxer- Zanaflex2Tid vs Flexeril5Tid...  DIZZINESS, CHRONIC (ICD-780.4) - MRI 2/09 per [name] showed atrophy, sm vessel dis, NAD...  Health Maintenance: ~  GI:  DrPatterson & up to date on colon screening. ~  GYN:  yearly f/u w/ Helene Shoe, NP for PAP, Mammogram at [name] [place], ?last BMD. ~  Immunizations:  yearly Flu vaccines in fall, Pneumovax in 2007?, TDAP given 5/11...   Past Surgical History  Procedure Laterality Date  . Coronary angioplasty with stent placement      Stent 2007  . Open heart surgery      01/19/2010  . Coronary artery bypass graft    . Cataract extraction, bilateral      bilateral caaract removal,  . Breast lumpectomy Left 02/06/11    Outpatient Encounter Prescriptions as of 05/03/2014  Medication Sig  . aspirin EC 81 MG tablet Take 81 mg by mouth daily.  . cyclobenzaprine (FLEXERIL) 5 MG tablet take 1/2 to 1 tablet by mouth twice a day if needed  . fexofenadine (ALLEGRA) 180 MG tablet Take 180 mg by mouth daily.    . fluocinonide (LIDEX) 0.05 % external solution Apply 1 application topically daily as needed (scalp itching).   .  Glucosamine-Chondroit-Vit C-Mn (GLUCOSAMINE CHONDR 1500 COMPLX PO) Take 1 tablet by mouth 3 (three) times daily.    . hydrALAZINE (APRESOLINE) 50 MG tablet Take 1/2 tablet by mouth three times daily  . hyoscyamine (ANASPAZ) 0.125 MG TBDP disintergrating tablet Place 0.125 mg under the tongue every 6 (six) hours as needed for bladder spasms or cramping.  [name] ketoconazole (NIZORAL) 2 % shampoo Apply 1 application topically 2 (two) times a week. As needed for itching a couple of times week  . latanoprost (XALATAN) 0.005 % ophthalmic solution Place 1 drop into both eyes at bedtime.   [name] letrozole (FEMARA) 2.5 MG tablet Take 1 tablet (2.5 mg total) by mouth daily.  [name] losartan (COZAAR) 100 MG tablet Take 1/2 tablet by mouth three times daily  . metoprolol (LOPRESSOR) 50 MG tablet Take 1 tablet (50 mg total) by mouth 3 (three) times daily.  . mometasone (NASONEX) 50 MCG/ACT nasal spray Place 2 sprays into the nose daily as needed (congestion).   . nitroGLYCERIN (NITROSTAT) 0.4 MG SL tablet Place 1 tablet (0.4 mg total) under the tongue every 5 (five) minutes as needed for chest pain.  . Omega-3 Fatty Acids (FISH OIL) 1200 MG CAPS Take 1,200 mg by mouth daily.   [name] omeprazole (PRILOSEC) 40 MG capsule Take 1 capsule (40 mg total) by mouth daily. 30 minutes before 1st meal of the day  . tiZANidine (ZANAFLEX) 2 MG tablet Take 2 mg by mouth at bedtime.  . traMADol (ULTRAM) 50 MG tablet Take 1 tablet (50 mg total) by mouth 3 (three) times daily as needed.  . vitamin B-12 (CYANOCOBALAMIN) 1000 MCG tablet Take 1,000 mcg by mouth daily.      Allergies  Allergen Reactions  . Choline Fenofibrate Other (See Comments)     pt states INTOL to Trilipix w/ "thigh burning"  . Simvastatin Other (See Comments)     pt states INTOL to STATINS \\T \ refuses to restart  .  Hctz [Hydrochlorothiazide]     Causes hyponatremia  . Hydrocodone     Nightmare after taking cough syrup w/hydrocodone  . Levaquin [Levofloxacin In D5w]      Elevated BP  . Adhesive [Tape] Rash  . Ceclor [Cefaclor] Rash  . Clarithromycin Rash  . Codeine Nausea Only  . Doxycycline Rash  . Lisinopril Cough    Developed ACE cough...  . Penicillins Itching and Rash    At injection site  . Tobramycin-Dexamethasone Rash    Current Medications, Allergies, Past Medical History, Past Surgical History, Family History, and Social History were reviewed in [employer] record.    Review of Systems    See HPI - all other systems neg except as noted...  The patient complains of headaches.  The patient denies anorexia, fever, weight loss, weight gain, vision loss, decreased hearing, hoarseness, chest pain, syncope, dyspnea on exertion, peripheral edema, prolonged cough, hemoptysis, abdominal pain, melena, hematochezia, severe indigestion/heartburn, hematuria, incontinence, muscle weakness, suspicious skin lesions, transient blindness, difficulty walking, depression, unusual weight change, abnormal bleeding, enlarged lymph nodes, and angioedema.   Objective:   Physical Exam    WD, WN, 79 y/o WF in NAD... GENERAL:  Alert & oriented; pleasant & cooperative... HEENT:  [place]/AT, EOM-wnl, PERRLA, EACs-clear, TMs-wnl, NOSE-clear, THROAT- clear & wnl. NECK:  Decr ROM; no JVD; normal carotid impulses w/o bruits; no thyromegaly or nodules palpated; no lymphadenopathy. CHEST:  Clear- no wheezing, rales, or signs of consolidation... HEART:  Regular Rhythm; without murmurs/ rubs/ or gallops detected... ABDOMEN:  Soft & nontender; normal bowel sounds; no organomegaly or masses palpated... EXT: without deformities, mild arthritic changes and +trigger points, no varicose veins/ venous insuffic/ or edema. NEURO:  CN's intact; motor testing normal; sensory testing normal; gait normal & balance OK. DERM:  No lesions noted; no rash etc...  RADIOLOGY DATA:  Reviewed in the EPIC EMR & discussed w/ the patient...  LABORATORY DATA:  Reviewed in the EPIC  EMR & discussed w/ the patient...   Assessment & Plan:    Asthma/ AR>  Stable on allergy shots & XOPENEX as needed; no recent exac... Hx pill asp w/ asthma exac in past => resolved...  HBP>  Controlled on BP med regimen of Metoprolol50Tid, Losartan100-1/2Tid, Hydralazine50-1/2Tid... BP is noted to be up when she has pain, esp HA pain; Headaches are managed by Luvenia Redden on Tramadol, Flexeril, Zanaflex... 3/16> she presented w/ severe HA and assoc elev BP readings- we rec increasing her 3 meds to 1 of each Tid to manage the BP until the pain abates...  CAD>  Cath w/ patent grafts; had neg Nuclear study; continue same meds and f/u [name]...  CHOL>  Followed in the [facility] & intol to all meds- FLP looks better however on diet alone...  GI> GERD, Divertics, ?Gallstones>  Followed by [facility] & stones eval by DrWilson [facility]; odd that f/u sonar 9/13 did not show stones...  BREAST CANCER>  on FEMARA > treated by [name], DrMagrinat, DrKinard & records reviewed...  FM/ HAs/ etc>  On Ultram, Tylenol, etc => needs active management of this prob by Neuro w/ an "action plan" for severe HA episodes... NECK PAIN> with multilevel spondylosis & eval by NS, [name] but she was not pleased & is asking for Rheum eval...  Other medical issues as noted> on CoQ10, Glucosamine, Vit B12, Vit D, etc...   Patient's Medications  New Prescriptions   No medications on file  Previous Medications   ASPIRIN EC 81 MG TABLET  Take 81 mg by mouth daily.   FEXOFENADINE (ALLEGRA) 180 MG TABLET    Take 180 mg by mouth daily.     FLUOCINONIDE (LIDEX) 0.05 % EXTERNAL SOLUTION    Apply 1 application topically daily as needed (scalp itching).    GLUCOSAMINE-CHONDROIT-VIT C-MN (GLUCOSAMINE CHONDR 1500 COMPLX PO)    Take 1 tablet by mouth 3 (three) times daily.     HYDRALAZINE (APRESOLINE) 50 MG TABLET    Take 1/2 tablet by mouth three times daily   HYOSCYAMINE (ANASPAZ) 0.125 MG TBDP DISINTERGRATING TABLET     Place 0.125 mg under the tongue every 6 (six) hours as needed for bladder spasms or cramping.   KETOCONAZOLE (NIZORAL) 2 % SHAMPOO    Apply 1 application topically 2 (two) times a week. As needed for itching a couple of times week   LATANOPROST (XALATAN) 0.005 % OPHTHALMIC SOLUTION    Place 1 drop into both eyes at bedtime.    LETROZOLE (FEMARA) 2.5 MG TABLET    Take 1 tablet (2.5 mg total) by mouth daily.   LOSARTAN (COZAAR) 100 MG TABLET    Take 0.5 tablets (50 mg total) by mouth 3 (three) times daily. Take 1/2 tablet by mouth three times daily   MAGNESIUM OXIDE (MAG-OX) 400 MG TABLET    Take 400 mg by mouth daily.   METOPROLOL (LOPRESSOR) 50 MG TABLET    Take 1 tablet (50 mg total) by mouth 3 (three) times daily.   MOMETASONE (NASONEX) 50 MCG/ACT NASAL SPRAY    Place 2 sprays into the nose daily as needed (congestion).    NITROGLYCERIN (NITROSTAT) 0.4 MG SL TABLET    Place 1 tablet (0.4 mg total) under the tongue every 5 (five) minutes as needed for chest pain.   OMEGA-3 FATTY ACIDS (FISH OIL) 1200 MG CAPS    Take 1,200 mg by mouth daily.    OMEPRAZOLE (PRILOSEC) 40 MG CAPSULE    Take 1 capsule (40 mg total) by mouth daily. 30 minutes before 1st meal of the day   PRESCRIPTION MEDICATION    Vitamin B 2   RANITIDINE (ZANTAC) 300 MG CAPSULE    Take 1 capsule (300 mg total) by mouth every evening.   TIZANIDINE (ZANAFLEX) 2 MG TABLET    Take 2 mg by mouth at bedtime.   TRAMADOL (ULTRAM) 50 MG TABLET    Take 1 tablet (50 mg total) by mouth 3 (three) times daily as needed.   VITAMIN B-12 (CYANOCOBALAMIN) 1000 MCG TABLET    Take 1,000 mcg by mouth daily.    Modified Medications   No medications on file  Discontinued Medications   No medications on file

## 2014-08-19 ENCOUNTER — Other Ambulatory Visit: Payer: Self-pay | Admitting: *Deleted

## 2014-08-19 MED ORDER — METOPROLOL TARTRATE 50 MG PO TABS: 50.0000 mg | ORAL_TABLET | Freq: Three times a day (TID) | ORAL | Status: DC

## 2014-08-23 DIAGNOSIS — G43611 Persistent migraine aura with cerebral infarction, intractable, with status migrainosus: Secondary | ICD-10-CM | POA: Diagnosis not present

## 2014-08-23 DIAGNOSIS — M9901 Segmental and somatic dysfunction of cervical region: Secondary | ICD-10-CM | POA: Diagnosis not present

## 2014-08-23 DIAGNOSIS — S134XXA Sprain of ligaments of cervical spine, initial encounter: Secondary | ICD-10-CM | POA: Diagnosis not present

## 2014-08-23 DIAGNOSIS — M5412 Radiculopathy, cervical region: Secondary | ICD-10-CM | POA: Diagnosis not present

## 2014-08-25 DIAGNOSIS — J3089 Other allergic rhinitis: Secondary | ICD-10-CM | POA: Diagnosis not present

## 2014-08-25 DIAGNOSIS — J301 Allergic rhinitis due to pollen: Secondary | ICD-10-CM | POA: Diagnosis not present

## 2014-09-01 ENCOUNTER — Telehealth: Payer: Self-pay | Admitting: Neurology

## 2014-09-01 ENCOUNTER — Telehealth: Payer: Self-pay

## 2014-09-01 NOTE — Telephone Encounter (Signed)
I spoke to [place] today. They report patient was set up on CPAP on 6/21. I left a message for patient to call back and make f/u appt with Dr. Rexene Alberts in August.

## 2014-09-01 NOTE — Telephone Encounter (Signed)
Patient just needs 36min or 33min F/U appt with Dr. Rexene Alberts in August or 1st part of September.

## 2014-09-01 NOTE — Telephone Encounter (Signed)
Pt is returning your call she is working outside today and may have to call you back dg

## 2014-09-01 NOTE — Telephone Encounter (Signed)
Opened in error

## 2014-09-02 ENCOUNTER — Other Ambulatory Visit: Payer: Self-pay | Admitting: *Deleted

## 2014-09-02 DIAGNOSIS — Z853 Personal history of malignant neoplasm of breast: Secondary | ICD-10-CM

## 2014-09-02 MED ORDER — LETROZOLE 2.5 MG PO TABS: 2.5000 mg | ORAL_TABLET | Freq: Every day | ORAL | Status: DC

## 2014-09-06 ENCOUNTER — Encounter: Payer: Self-pay | Admitting: Nurse Practitioner

## 2014-09-06 ENCOUNTER — Ambulatory Visit: Payer: Medicare Other | Admitting: Neurology

## 2014-09-06 ENCOUNTER — Ambulatory Visit (INDEPENDENT_AMBULATORY_CARE_PROVIDER_SITE_OTHER): Payer: Medicare Other | Admitting: Nurse Practitioner

## 2014-09-06 ENCOUNTER — Other Ambulatory Visit: Payer: Self-pay | Admitting: *Deleted

## 2014-09-06 VITALS — BP 140/74 | HR 60 | Ht <= 58 in | Wt 134.0 lb

## 2014-09-06 DIAGNOSIS — J301 Allergic rhinitis due to pollen: Secondary | ICD-10-CM | POA: Diagnosis not present

## 2014-09-06 DIAGNOSIS — Z853 Personal history of malignant neoplasm of breast: Secondary | ICD-10-CM

## 2014-09-06 DIAGNOSIS — E559 Vitamin D deficiency, unspecified: Secondary | ICD-10-CM | POA: Diagnosis not present

## 2014-09-06 DIAGNOSIS — J3089 Other allergic rhinitis: Secondary | ICD-10-CM | POA: Diagnosis not present

## 2014-09-06 DIAGNOSIS — Z0000 Encounter for general adult medical examination without abnormal findings: Secondary | ICD-10-CM | POA: Diagnosis not present

## 2014-09-06 DIAGNOSIS — Z01419 Encounter for gynecological examination (general) (routine) without abnormal findings: Secondary | ICD-10-CM

## 2014-09-06 DIAGNOSIS — Z124 Encounter for screening for malignant neoplasm of cervix: Secondary | ICD-10-CM

## 2014-09-06 DIAGNOSIS — C50519 Malignant neoplasm of lower-outer quadrant of unspecified female breast: Secondary | ICD-10-CM

## 2014-09-06 MED ORDER — LETROZOLE 2.5 MG PO TABS: 2.5000 mg | ORAL_TABLET | Freq: Every day | ORAL | Status: DC

## 2014-09-06 NOTE — Addendum Note (Signed)
Addended by: Cherylynn Ridges on: 09/06/2014 09:06 AM   Modules accepted: Orders

## 2014-09-06 NOTE — Progress Notes (Signed)
Patient ID: [name], female   DOB: Apr 17, 1935, 79 y.o.   MRN: [id number] 79 y.o. [id number] Married  Caucasian Fe here for annual exam.  She feels well except for persistent vaginal dryness and dyspareunia.  She is using EVOO prn.  Her last Oncology apt. Went well and Dr. Jana Hakim wants her to repeat a BMD - due again after 12/23/14.  She remains on Femara.  She and her husband have celebrated their 25 th wedding anniversary.  Patient's last menstrual period was 11/26/1990.          Sexually active: Yes.    The current method of family planning is post menopausal status.    Exercising: No.  occasional walking Smoker:  no  Health Maintenance: Pap:  08/15/2011,  Negative MMG:  03/30/14, 3D, Bi-Rads 2:  Benign findings Colonoscopy:  06/29/14, 1 Tubular adenoma, 1 hyperplastic polyp, no repeat due to age BMD:  12/22/12, T-Score -1.1 S/-1.3 R/-1.1 L TDaP:  PCP maintains tetanus.   Labs: PCP maintains lab (blood) work and urine.   reports that she has never smoked. She has never used smokeless tobacco. She reports that she does not drink alcohol or use illicit drugs.  Past Medical History  Diagnosis Date  . Diverticulosis of colon (without mention of hemorrhage)   . Irritable bowel syndrome   . Colonic polyp 04-27-2009    tubular adenoma  . Headache(784.0)     a. frequently assocaited with high BPs  . Breast cancer, Left 12/20/2010    NO BLOOD PRESSURE CHECKS OR STICKS IN LEFT ARM  . Gallstones   . CAD (coronary artery disease)     a. 01/19/2010 s/p CABG x 3, lima->lad, vg->diag, vg->om1;  b. 06/2011 :Lexi MV: EF 84%, No ischemia. c. 01/06/14 s/p negative nuclear stress test with EF >70%  . Fibromyalgia   . Atrophic vaginitis   . GERD (gastroesophageal reflux disease)   . Hypercholesterolemia   . Glaucoma   . Cervical arthritis   . Hiatal hernia   . Labile hypertension   . Asthma     Past Surgical History  Procedure Laterality Date  . Coronary angioplasty with stent placement       Stent 2007  . Open heart surgery      01/19/2010  . Coronary artery bypass graft    . Cataract extraction, bilateral      bilateral caaract removal,  . Breast lumpectomy Left 02/06/11    Current Outpatient Prescriptions  Medication Sig Dispense Refill  . aspirin EC 81 MG tablet Take 81 mg by mouth daily.    . fexofenadine (ALLEGRA) 180 MG tablet Take 180 mg by mouth daily.      . fluocinonide (LIDEX) 0.05 % external solution Apply 1 application topically daily as needed (scalp itching).     . Glucosamine-Chondroit-Vit C-Mn (GLUCOSAMINE CHONDR 1500 COMPLX PO) Take 1 tablet by mouth 3 (three) times daily.      . hydrALAZINE (APRESOLINE) 50 MG tablet Take 1/2 tablet by mouth three times daily    . hyoscyamine (ANASPAZ) 0.125 MG TBDP disintergrating tablet Place 0.125 mg under the tongue every 6 (six) hours as needed for bladder spasms or cramping.    [name] ketoconazole (NIZORAL) 2 % shampoo Apply 1 application topically 2 (two) times a week. As needed for itching a couple of times week    . latanoprost (XALATAN) 0.005 % ophthalmic solution Place 1 drop into both eyes at bedtime.     [name] letrozole (FEMARA) 2.5 MG tablet  Take 1 tablet (2.5 mg total) by mouth daily. 90 tablet 3  . losartan (COZAAR) 100 MG tablet Take 0.5 tablets (50 mg total) by mouth 3 (three) times daily. Take 1/2 tablet by mouth three times daily 45 tablet 5  . magnesium oxide (MAG-OX) 400 MG tablet Take 400 mg by mouth daily.    . metoprolol (LOPRESSOR) 50 MG tablet Take 1 tablet (50 mg total) by mouth 3 (three) times daily. 90 tablet 1  . mometasone (NASONEX) 50 MCG/ACT nasal spray Place 2 sprays into the nose daily as needed (congestion).     . nitroGLYCERIN (NITROSTAT) 0.4 MG SL tablet Place 1 tablet (0.4 mg total) under the tongue every 5 (five) minutes as needed for chest pain. 25 tablet 3  . Omega-3 Fatty Acids (FISH OIL) 1200 MG CAPS Take 1,200 mg by mouth daily.     [name] omeprazole (PRILOSEC) 40 MG capsule Take 1 capsule  (40 mg total) by mouth daily. 30 minutes before 1st meal of the day 30 capsule 6  . PRESCRIPTION MEDICATION Vitamin B 2    . ranitidine (ZANTAC) 300 MG capsule Take 1 capsule (300 mg total) by mouth every evening. 30 capsule 3  . tiZANidine (ZANAFLEX) 2 MG tablet Take 2 mg by mouth at bedtime.    . traMADol (ULTRAM) 50 MG tablet Take 1 tablet (50 mg total) by mouth 3 (three) times daily as needed. 90 tablet 5  . vitamin B-12 (CYANOCOBALAMIN) 1000 MCG tablet Take 1,000 mcg by mouth daily.       No current facility-administered medications for this visit.    Family History  Problem Relation Age of Onset  . Heart disease Brother   . Cancer Brother     gland cancer  . Diabetes Sister   . Breast cancer Sister   . Stroke Mother   . Stroke Father   . Colon cancer Neg Hx   . Stomach cancer Neg Hx   . Pancreatic cancer Neg Hx   . Prostate cancer Father   . Kidney disease Neg Hx   . Liver disease Neg Hx     ROS:  Pertinent items are noted in HPI.  Otherwise, a comprehensive ROS was negative.  Exam:   BP 140/74 mmHg  Pulse 60  Ht 4\' 10"  (1.473 m)  Wt 134 lb (60.782 kg)  BMI 28.01 kg/m2  LMP 11/26/1990 Height: 4\' 10"  (147.3 cm) Ht Readings from Last 3 Encounters:  09/06/14 4\' 10"  (1.473 m)  08/02/14 4\' 9"  (1.448 m)  06/29/14 4\' 9"  (1.448 m)    General appearance: alert, cooperative and appears stated age Head: Normocephalic, without obvious abnormality, atraumatic Neck: no adenopathy, supple, symmetrical, trachea midline and thyroid normal to inspection and palpation Lungs: clear to auscultation bilaterally Breasts: normal appearance, no masses or tenderness, on the right.  The left breast with surgical and radiation changes and as usual remains tender at the uppermost quadrant. Heart: regular rate and rhythm Abdomen: soft, non-tender; no masses,  no organomegaly Extremities: extremities normal, atraumatic, no cyanosis or edema Skin: Skin color, texture, turgor normal. No rashes  or lesions Lymph nodes: Cervical, supraclavicular, and axillary nodes normal. No abnormal inguinal nodes palpated Neurologic: Grossly normal   Pelvic: External genitalia:  no lesions              Urethra:  normal appearing urethra with no masses, tenderness or lesions              Bartholin's and Skene's: normal  Vagina: normal appearing vagina with normal color and discharge, no lesions              Cervix: anteverted              Pap taken: Yes.   Bimanual Exam:  Uterus:  normal size, contour, position, consistency, mobility, non-tender              Adnexa: no mass, fullness, tenderness               Rectovaginal: Confirms               Anus:  normal sphincter tone, no lesions  Chaperone present:  yes  A:  Well Woman with normal exam  S/P left invasive breast cancer 2012 treated with lumpectomy and radiation, on Femara History of HTN, hypercholesterolemia, CAD with CABG 12/2009 Osteopenia   Recent episodes of labile HTN with hospital visits  P:   Reviewed health and wellness pertinent to exam  Pap smear as above  Mammogram is due 03/2015  Counseled on breast self exam, mammography screening, adequate intake of calcium and vitamin D, diet and exercise return annually or prn  An After Visit Summary was printed and given to the patient.

## 2014-09-06 NOTE — Telephone Encounter (Signed)
"  I just spoke with you and would like you to send refill to [facility].  It's cheaper there."

## 2014-09-06 NOTE — Telephone Encounter (Signed)
Patient called reporting [facility] has not received Letrozole refill.  Informed her this was sent to [facility] on [address].  She will call [facility].

## 2014-09-06 NOTE — Patient Instructions (Signed)

## 2014-09-07 DIAGNOSIS — M9901 Segmental and somatic dysfunction of cervical region: Secondary | ICD-10-CM | POA: Diagnosis not present

## 2014-09-07 DIAGNOSIS — S13160A Subluxation of C5/C6 cervical vertebrae, initial encounter: Secondary | ICD-10-CM | POA: Diagnosis not present

## 2014-09-07 DIAGNOSIS — M5412 Radiculopathy, cervical region: Secondary | ICD-10-CM | POA: Diagnosis not present

## 2014-09-07 DIAGNOSIS — R51 Headache: Secondary | ICD-10-CM | POA: Diagnosis not present

## 2014-09-07 LAB — VITAMIN D 25 HYDROXY (VIT D DEFICIENCY, FRACTURES): Vit D, 25-Hydroxy: 29 ng/mL — ABNORMAL LOW (ref 30–100)

## 2014-09-08 NOTE — Progress Notes (Signed)
Encounter reviewed by Dr. Brook Amundson C. Silva.  

## 2014-09-09 LAB — IPS PAP SMEAR ONLY

## 2014-09-12 ENCOUNTER — Telehealth: Payer: Self-pay | Admitting: *Deleted

## 2014-09-12 NOTE — Telephone Encounter (Signed)
-----   Message from [place], [place] sent at 09/07/2014  8:49 AM EDT ----- Please let Par know that her Vit D level has dropped from 47 to 27.  She is on calcium plus D - but this is not enough.  Follow protocol.

## 2014-09-12 NOTE — Telephone Encounter (Signed)
I have attempted to contact this patient by phone with the following results: left message to return call to [place] at [phone] answering machine (home per [facility]).  No personal information given.  [phone] (Home) *Preferred*

## 2014-09-13 DIAGNOSIS — S13130A Subluxation of C2/C3 cervical vertebrae, initial encounter: Secondary | ICD-10-CM | POA: Diagnosis not present

## 2014-09-13 DIAGNOSIS — R51 Headache: Secondary | ICD-10-CM | POA: Diagnosis not present

## 2014-09-13 DIAGNOSIS — M5032 Other cervical disc degeneration, mid-cervical region: Secondary | ICD-10-CM | POA: Diagnosis not present

## 2014-09-13 DIAGNOSIS — M5412 Radiculopathy, cervical region: Secondary | ICD-10-CM | POA: Diagnosis not present

## 2014-09-13 DIAGNOSIS — M542 Cervicalgia: Secondary | ICD-10-CM | POA: Diagnosis not present

## 2014-09-15 NOTE — Telephone Encounter (Signed)
Pt notified in result note.  Closing encounter. 

## 2014-09-15 NOTE — Telephone Encounter (Signed)
I have attempted to contact this patient by phone with the following results: left message to return call to [place] at [phone] answering machine (home per [facility]). No personal information given. [phone] (Home) *Preferred*

## 2014-09-20 DIAGNOSIS — M9901 Segmental and somatic dysfunction of cervical region: Secondary | ICD-10-CM | POA: Diagnosis not present

## 2014-09-20 DIAGNOSIS — R51 Headache: Secondary | ICD-10-CM | POA: Diagnosis not present

## 2014-09-20 DIAGNOSIS — M542 Cervicalgia: Secondary | ICD-10-CM | POA: Diagnosis not present

## 2014-09-20 DIAGNOSIS — S13120A Subluxation of C1/C2 cervical vertebrae, initial encounter: Secondary | ICD-10-CM | POA: Diagnosis not present

## 2014-09-20 DIAGNOSIS — M5412 Radiculopathy, cervical region: Secondary | ICD-10-CM | POA: Diagnosis not present

## 2014-09-22 ENCOUNTER — Other Ambulatory Visit: Payer: Self-pay | Admitting: Pulmonary Disease

## 2014-09-26 NOTE — Progress Notes (Signed)
Patient ID: SABIHA SURA, female   DOB: [birth year], 79 y.o.   MRN: [id number] CLAUDETTA SALLIE is a 79 y.o. [name] female with a history of CAD, CABG 2011, HTN, HLD, HA, fibromyalgia, IBS, and GERD  She was admitted 10/29-11/02 with HA due to hypertensive emergency (SBP > 220), and hyponatremia (Na + 119). HCTZ was stopped and her sodium improved. She may have been taking an OTC medication with pseudoephedrine.   She was seen in the ER on 11/04 with HTN, SBP > 210. Discharged from the ER.  Seen by Dr. Lenna Gilford 11/05, BP meds increased with addition of hydralazine 25 mg BID, increased Losartan 50 mg-->100 mg daily.  ED visit 11/09 with HA, BP 212/92, MD suggested she stagger meds for better 24 hour coverage, no med changes made.   She was seen in hospital 11/15  With r/o normal lexiscan myovue and echo with normal EF  BP meds adjusted  Not on diuretic due to hyponatremia She seems more anxious with labile emotions  Sees Lenna Gilford as primary and Rosina Lowenstein neuro for headaches.  Has had them ? A year  When she gets them Makes her BP labile   She is intolerant to statins and refuses to try new ones   Wants to see Dr Tat for 2nd opinion about headaches.  Some tightness in chest going up steep hill near house but has not taken nitro   ROS: Denies fever, malais, weight loss, blurry vision, decreased visual acuity, cough, sputum, SOB, hemoptysis, pleuritic pain, palpitaitons, heartburn, abdominal pain, melena, lower extremity edema, claudication, or rash.  All other systems reviewed and negative  General: Affect appropriate Healthy:  appears stated age 79: normal Neck supple with no adenopathy JVP normal no bruits no thyromegaly Lungs clear with no wheezing and good diaphragmatic motion Heart:  S1/S2 no murmur, no rub, gallop or click PMI normal Abdomen: benighn, BS positve, no tenderness, no AAA no bruit.  No HSM or HJR Distal pulses intact with no bruits No edema Neuro non-focal Skin  warm and dry No muscular weakness   Current Outpatient Prescriptions  Medication Sig Dispense Refill  . aspirin EC 81 MG tablet Take 81 mg by mouth daily.    . fexofenadine (ALLEGRA) 180 MG tablet Take 180 mg by mouth daily.      . fluocinonide (LIDEX) 0.05 % external solution Apply 1 application topically daily as needed (scalp itching).     . Glucosamine-Chondroit-Vit C-Mn (GLUCOSAMINE CHONDR 1500 COMPLX PO) Take 1 tablet by mouth 3 (three) times daily.      . hydrALAZINE (APRESOLINE) 50 MG tablet Take 1/2 tablet by mouth three times daily    . hyoscyamine (ANASPAZ) 0.125 MG TBDP disintergrating tablet Place 0.125 mg under the tongue every 6 (six) hours as needed for bladder spasms or cramping.    [name] ketoconazole (NIZORAL) 2 % shampoo Apply 1 application topically 2 (two) times a week. As needed for itching a couple of times week    . latanoprost (XALATAN) 0.005 % ophthalmic solution Place 1 drop into both eyes at bedtime.     [name] letrozole (FEMARA) 2.5 MG tablet Take 1 tablet (2.5 mg total) by mouth daily. 90 tablet 3  . losartan (COZAAR) 100 MG tablet Take 0.5 tablets (50 mg total) by mouth 3 (three) times daily. Take 1/2 tablet by mouth three times daily 45 tablet 5  . magnesium oxide (MAG-OX) 400 MG tablet Take 400 mg by mouth daily.    . metoprolol (LOPRESSOR)  50 MG tablet Take 1 tablet (50 mg total) by mouth 3 (three) times daily. 90 tablet 1  . mometasone (NASONEX) 50 MCG/ACT nasal spray Place 2 sprays into the nose daily as needed (congestion).     . nitroGLYCERIN (NITROSTAT) 0.4 MG SL tablet Place 1 tablet (0.4 mg total) under the tongue every 5 (five) minutes as needed for chest pain. 25 tablet 3  . Omega-3 Fatty Acids (FISH OIL) 1200 MG CAPS Take 1,200 mg by mouth daily.     [name] omeprazole (PRILOSEC) 40 MG capsule take 1 capsule by mouth once daily 30 MINUTES BEFORE 1ST MEAL OF THE DAY 30 capsule 4  . PRESCRIPTION MEDICATION Vitamin B 2    . ranitidine (ZANTAC) 300 MG capsule Take 1  capsule (300 mg total) by mouth every evening. 30 capsule 3  . tiZANidine (ZANAFLEX) 2 MG tablet Take 2 mg by mouth at bedtime.    . traMADol (ULTRAM) 50 MG tablet Take 1 tablet (50 mg total) by mouth 3 (three) times daily as needed. 90 tablet 5  . vitamin B-12 (CYANOCOBALAMIN) 1000 MCG tablet Take 1,000 mcg by mouth daily.       No current facility-administered medications for this visit.    Allergies  Choline fenofibrate; Simvastatin; Hctz; Hydrocodone; Levaquin; Adhesive; Ceclor; Clarithromycin; Codeine; Doxycycline; Lisinopril; Penicillins; and Tobramycin-dexamethasone  Electrocardiogram:  SR rate 64  Normal LAE 01/14/14   Assessment and Plan CAD/CABG: non ishcemic myovue 2015  She will call if has to take nitro continue ASA/Beta blocker  HTN:  Much improved  meds refilled    Breast Cancer   F/U Magrinat No BP's on left arm  mamogram last year ok  Chol:   Lab Results  Component Value Date   LDLCALC 110* 04/06/2012   Labs with Dr Ree Kida to statin    Refer Tat Headaches F/U me 6 months  Jenkins Rouge

## 2014-09-27 DIAGNOSIS — J3089 Other allergic rhinitis: Secondary | ICD-10-CM | POA: Diagnosis not present

## 2014-09-27 DIAGNOSIS — J301 Allergic rhinitis due to pollen: Secondary | ICD-10-CM | POA: Diagnosis not present

## 2014-09-29 ENCOUNTER — Ambulatory Visit: Payer: Medicare Other | Admitting: Neurology

## 2014-09-30 ENCOUNTER — Encounter: Payer: Self-pay | Admitting: Cardiovascular Disease

## 2014-09-30 ENCOUNTER — Ambulatory Visit (INDEPENDENT_AMBULATORY_CARE_PROVIDER_SITE_OTHER): Payer: Medicare Other | Admitting: Cardiovascular Disease

## 2014-09-30 VITALS — BP 158/72 | HR 68 | Ht <= 58 in | Wt 134.8 lb

## 2014-09-30 DIAGNOSIS — R519 Headache, unspecified: Secondary | ICD-10-CM

## 2014-09-30 DIAGNOSIS — R51 Headache: Secondary | ICD-10-CM | POA: Diagnosis not present

## 2014-09-30 DIAGNOSIS — I2510 Atherosclerotic heart disease of native coronary artery without angina pectoris: Secondary | ICD-10-CM

## 2014-09-30 DIAGNOSIS — I2583 Coronary atherosclerosis due to lipid rich plaque: Secondary | ICD-10-CM

## 2014-09-30 DIAGNOSIS — J3089 Other allergic rhinitis: Secondary | ICD-10-CM | POA: Diagnosis not present

## 2014-09-30 DIAGNOSIS — J301 Allergic rhinitis due to pollen: Secondary | ICD-10-CM | POA: Diagnosis not present

## 2014-09-30 NOTE — Patient Instructions (Signed)
Medication Instructions:  NO CHANGES  Labwork: NONE  Testing/Procedures: NONE  Follow-Up: Your physician wants you to follow-up in: [place] will receive a reminder letter in the mail two months in advance. If you don't receive a letter, please call our office to schedule the follow-up appointment.  You have been referred to DR  TAT  Any Other Special Instructions Will Be Listed Below (If Applicable).

## 2014-10-04 DIAGNOSIS — M5032 Other cervical disc degeneration, mid-cervical region: Secondary | ICD-10-CM | POA: Diagnosis not present

## 2014-10-04 DIAGNOSIS — M5412 Radiculopathy, cervical region: Secondary | ICD-10-CM | POA: Diagnosis not present

## 2014-10-04 DIAGNOSIS — J301 Allergic rhinitis due to pollen: Secondary | ICD-10-CM | POA: Diagnosis not present

## 2014-10-04 DIAGNOSIS — M9901 Segmental and somatic dysfunction of cervical region: Secondary | ICD-10-CM | POA: Diagnosis not present

## 2014-10-04 DIAGNOSIS — J3089 Other allergic rhinitis: Secondary | ICD-10-CM | POA: Diagnosis not present

## 2014-10-04 DIAGNOSIS — M542 Cervicalgia: Secondary | ICD-10-CM | POA: Diagnosis not present

## 2014-10-04 DIAGNOSIS — M79644 Pain in right finger(s): Secondary | ICD-10-CM | POA: Diagnosis not present

## 2014-10-06 DIAGNOSIS — J3089 Other allergic rhinitis: Secondary | ICD-10-CM | POA: Diagnosis not present

## 2014-10-06 DIAGNOSIS — J301 Allergic rhinitis due to pollen: Secondary | ICD-10-CM | POA: Diagnosis not present

## 2014-10-12 ENCOUNTER — Ambulatory Visit (INDEPENDENT_AMBULATORY_CARE_PROVIDER_SITE_OTHER): Payer: Medicare Other | Admitting: Neurology

## 2014-10-12 ENCOUNTER — Encounter: Payer: Self-pay | Admitting: Neurology

## 2014-10-12 ENCOUNTER — Ambulatory Visit: Payer: Medicare Other | Admitting: Neurology

## 2014-10-12 VITALS — BP 163/78 | HR 64 | Ht <= 58 in | Wt 135.0 lb

## 2014-10-12 DIAGNOSIS — R51 Headache: Secondary | ICD-10-CM | POA: Diagnosis not present

## 2014-10-12 DIAGNOSIS — G4733 Obstructive sleep apnea (adult) (pediatric): Secondary | ICD-10-CM

## 2014-10-12 DIAGNOSIS — J301 Allergic rhinitis due to pollen: Secondary | ICD-10-CM | POA: Diagnosis not present

## 2014-10-12 DIAGNOSIS — R519 Headache, unspecified: Secondary | ICD-10-CM

## 2014-10-12 DIAGNOSIS — J3089 Other allergic rhinitis: Secondary | ICD-10-CM | POA: Diagnosis not present

## 2014-10-12 DIAGNOSIS — I2510 Atherosclerotic heart disease of native coronary artery without angina pectoris: Secondary | ICD-10-CM

## 2014-10-12 NOTE — Patient Instructions (Signed)
Occipital Nerve Block Patient Information  Description: The occipital nerves originate in the cervical (neck) spinal cord and travel upward through muscle and tissue to supply sensation to the back of the head and top of the scalp.  In addition, the nerves control some of the muscles of the scalp.  Occipital neuralgia is an irritation of these nerves which can cause headaches, numbness of the scalp, and neck discomfort.     The occipital nerve block will interrupt nerve transmission through these nerves and can relieve pain and spasm.  The block consists of insertion of a small needle under the skin in the back of the head to deposit local anesthetic (numbing medicine) and/or steroids around the nerve.  The entire block usually lasts less than 5 minutes.  Conditions which may be treated by occipital blocks:   Muscular pain and spasm of the scalp  Nerve irritation, back of the head  Headaches  Upper neck pain  Preparation for the injection:  1. Do not eat any solid food or dairy products within 6 hours of your appointment. 2. You may drink clear liquids up to 2 hours before appointment.  Clear liquids include water, black coffee, juice or soda.  No milk or cream please. 3. You may take your regular medication, including pain medications, with a sip of water before you appointment.  Diabetics should hold regular insulin (if taken separately) and take 1/2 normal NPH dose the morning of the procedure.  Carry some sugar containing items with you to your appointment. 4. A driver must accompany you and be prepared to drive you home after your procedure. 5. Bring all your current medications with you. 6. An IV may be inserted and sedation may be given at the discretion of the physician. 7. A blood pressure cuff, EKG, and other monitors will often be applied during the procedure.  Some patients may need to have extra oxygen administered for a short period. 8. You will be asked to provide medical  information, including your allergies and medications, prior to the procedure.  We must know immediately if you are taking blood thinners (like Coumadin/Warfarin) or if you are allergic to IV iodine contrast (dye).  We must know if you could possible be pregnant.  9. Do not wear a high collared shirt or turtleneck.  Tie long hair up in the back if possible.  Possible side-effects:   Bleeding from needle site  Infection (rare, may require surgery)  Nerve injury (rare)  Hair on back of neck can be tinged with iodine scrub (this will wash out)  Light-headedness (temporary)  Pain at injection site (several days)  Decreased blood pressure (rare, temporary)  Seizure (very rare)  Call if you experience:   Hives or difficulty breathing ( go to the emergency room)  Inflammation or drainage at the injection site(s)  Please note:  Although the local anesthetic injected can often make your painful muscles or headache feel good for several hours after the injection, the pain may return.  It takes 3-7 days for steroids to work.  You may not notice any pain relief for at least one week.  If effective, we will often do a series of injections spaced 3-6 weeks apart to maximally decrease your pain.  If you have any questions, please call [phone] [facility]

## 2014-10-12 NOTE — Progress Notes (Signed)
Chief Complaint  Patient presents with  . Headache    Her headaches have started to worsen again.  She has some type of headache daily that varies in severity.  She uses Tylenol or Tramadol depending on her level of pain.  . Sleep Apnea    She is using her CPAP nightly.  She is having some difficutly with comfort using the nasal cannula.  She wonders if a mask would be more comfortable.     [facility]  PATIENT: [name] DOB: 1935/07/19   HISTORY OF PRESENT ILLNESS:[name], 79 year old female returns for followup.   HISTORY: Evaluation of headaches initial visit Sept 2014   She had a past medical history of coronary artery disease, status post  stent, hypertension, hyperlipidemia, she is taking beta blocker metoprolol 50 mg twice a day. she also has a history of breast cancer, status post left lobectomy followed by chemotherapy and radiation therapy in 2012   She reported a history of migraine headaches since age 84s, her typical migraine are occipital area bandlike, severe pounding headache with associated light noise sensitivity, clustered around her menstruation period of time, her headache has quieted down for many years, until about 2014, she began to experience frequent headaches again, somewhat similar to the headache she had previously, bandlike starting from occipital,to bilateral temporal frontal region, pressure, with associated light noise sensitivity.   She reported 15 days out of a month, she would have moderate degree of headaches   She has tried ice and heating pad, which helped her headache some, she is also taking Percocet, Zofran as needed for nausea.   She had CAT scan of the brain that was normal, MRI of cervical spine showed multilevel degenerative disc disease, mild right foraminal stenosis at C3-4, secondary to asymmetric right-sided facet hypertrophy, and joint disease,  She denies jaw claudication, no gait difficulty no diffuse  muscle achy pain, no chewing or swallowing difficulties   Previous ESR, was normal, only mild elevated C-reactive protein  UPDATE 01/18/2014:  She was admitted to hospital in October 2015 for acute bronchitis, was treated with antibiotics, later was readmitted to the hospital for hyponatremia as low as 118, most recent sodium was 130 in January 24 2014,  CT head without contrast, showed mild atrophy, small vessel disease, no acute lesions.  Echocardiogram was essentially normal.  Over the past 2 months, she began to have frequent headaches again, often starting from occipital area, spreading forward, pressure and burning sensation, lasting 30 minutes, resting was helpful   Ultram as needed for headaches only provides mild help  She was also noted to have elevated blood pressure, to 200, is on 3 agent treatment, hydralazine 50 mg 3 times a day, metoprolol 50 mg 3 times a day, losartan 50 mg twice a day.  UPDATE Jan 14th 2016: She continue have frequent early morning headaches, bandlike sensation around her vertex region, 5 out of 10, sometimes can go up to 10 out of 10, she is taking tramadol, Tylenol PM as needed she also complains of frequent awakening at nighttime,  nocturia dry mouth, snoring, ESS score is 30, F S S score is 58   UPDATE March 17th 2016: She has vertex pressure headache, starting at occipital region, at least 3 times a day, she is emotional, upset Sleep study confirmed OSA, she has not got her CPAP machine yet,  UPDATE August 17th 2016: Her bandlike sensation pressure headache has much improved, today she focus on radiating pain from  right occipital region spreading forward to right frontal region, sometimes with right-sided skull sensitivity, occasionally radiating discomfort right shoulder, right upper extremity,  She denies gait difficulty, no bowel and bladder incontinence,  She had a sleep study in March 2016, consistent with obstructive sleep apnea, using CPAP  machine, has difficulty with nasal cannular, has follow-up appointment with Dr. Rexene [name] in September 2016  I have reviewed MRI of cervical spine May 2014: Mild right foraminal stenosis at C3-4 secondary to asymmetric right-sided facet hypertrophy and uncovertebral disease. Facet hypertrophy and disc osteophyte complexes at C4-5 with mild central canal narrowing. Mild to moderate left central canal stenosis C5-6 without significant foraminal disease. Mild central and left foraminal stenosis at C6-7 secondary to a broad-based disc osteophyte complex and uncovertebral disease. Mild left foraminal narrowing at C7-C1 secondary to facet disease. REVIEW OF SYSTEMS: Full 14 system review of systems performed and notable only for those listed, all others are neg: As above   ALLERGIES: Allergies  Allergen Reactions  . Choline Fenofibrate Other (See Comments)     pt states INTOL to Trilipix w/ "thigh burning"  . Simvastatin Other (See Comments)     pt states INTOL to STATINS$RemoveBeforeD' \\T'[id number]$ \ refuses to restart  . Hctz [Hydrochlorothiazide]     Causes hyponatremia  . Hydrocodone     Nightmare after taking cough syrup w/hydrocodone  . Levaquin [Levofloxacin In D5w]     Elevated BP  . Adhesive [Tape] Rash  . Ceclor [Cefaclor] Rash  . Clarithromycin Rash  . Codeine Nausea Only  . Doxycycline Rash  . Lisinopril Cough    Developed ACE cough...  . Penicillins Itching and Rash    At injection site  . Tobramycin-Dexamethasone Rash    HOME MEDICATIONS: Outpatient Prescriptions Prior to Visit  Medication Sig Dispense Refill  . aspirin EC 81 MG tablet Take 81 mg by mouth daily.    . fexofenadine (ALLEGRA) 180 MG tablet Take 180 mg by mouth daily.      . fluocinonide (LIDEX) 0.05 % external solution Apply 1 application topically daily as needed (scalp itching).     . Glucosamine-Chondroit-Vit C-Mn (GLUCOSAMINE CHONDR 1500 COMPLX PO) Take 1 tablet by mouth 3 (three) times daily.      . hydrALAZINE (APRESOLINE) 50 MG  tablet Take 1/2 tablet by mouth three times daily    . hyoscyamine (ANASPAZ) 0.125 MG TBDP disintergrating tablet Place 0.125 mg under the tongue every 6 (six) hours as needed for bladder spasms or cramping.    [name] ketoconazole (NIZORAL) 2 % shampoo Apply 1 application topically 2 (two) times a week. As needed for itching a couple of times week    . latanoprost (XALATAN) 0.005 % ophthalmic solution Place 1 drop into both eyes at bedtime.     [name] letrozole (FEMARA) 2.5 MG tablet Take 1 tablet (2.5 mg total) by mouth daily. 90 tablet 3  . losartan (COZAAR) 100 MG tablet Take 0.5 tablets (50 mg total) by mouth 3 (three) times daily. Take 1/2 tablet by mouth three times daily 45 tablet 5  . magnesium oxide (MAG-OX) 400 MG tablet Take 400 mg by mouth daily.    . metoprolol (LOPRESSOR) 50 MG tablet Take 1 tablet (50 mg total) by mouth 3 (three) times daily. 90 tablet 1  . mometasone (NASONEX) 50 MCG/ACT nasal spray Place 2 sprays into the nose daily as needed (congestion).     . nitroGLYCERIN (NITROSTAT) 0.4 MG SL tablet Place 1 tablet (0.4 mg total) under the tongue every  5 (five) minutes as needed for chest pain. 25 tablet 3  . Omega-3 Fatty Acids (FISH OIL) 1200 MG CAPS Take 1,200 mg by mouth daily.     [name] omeprazole (PRILOSEC) 40 MG capsule take 1 capsule by mouth once daily 30 MINUTES BEFORE 1ST MEAL OF THE DAY 30 capsule 4  . PRESCRIPTION MEDICATION Vitamin B 2    . ranitidine (ZANTAC) 300 MG capsule Take 1 capsule (300 mg total) by mouth every evening. 30 capsule 3  . traMADol (ULTRAM) 50 MG tablet Take 1 tablet (50 mg total) by mouth 3 (three) times daily as needed. 90 tablet 5  . vitamin B-12 (CYANOCOBALAMIN) 1000 MCG tablet Take 1,000 mcg by mouth daily.      [name] tiZANidine (ZANAFLEX) 2 MG tablet Take 2 mg by mouth at bedtime.     No facility-administered medications prior to visit.    PAST MEDICAL HISTORY: Past Medical History  Diagnosis Date  . Diverticulosis of colon (without mention of  hemorrhage)   . Irritable bowel syndrome   . Colonic polyp 04-27-2009    tubular adenoma  . Headache(784.0)     a. frequently assocaited with high BPs  . Breast cancer, Left 12/20/2010    NO BLOOD PRESSURE CHECKS OR STICKS IN LEFT ARM  . Gallstones   . CAD (coronary artery disease)     a. 01/19/2010 s/p CABG x 3, lima->lad, vg->diag, vg->om1;  b. 06/2011 :Lexi MV: EF 84%, No ischemia. c. 01/06/14 s/p negative nuclear stress test with EF >70%  . Fibromyalgia   . Atrophic vaginitis   . GERD (gastroesophageal reflux disease)   . Hypercholesterolemia   . Glaucoma   . Cervical arthritis   . Hiatal hernia   . Labile hypertension   . Asthma     PAST SURGICAL HISTORY: Past Surgical History  Procedure Laterality Date  . Coronary angioplasty with stent placement      Stent 2007  . Open heart surgery      01/19/2010  . Coronary artery bypass graft    . Cataract extraction, bilateral      bilateral caaract removal,  . Breast lumpectomy Left 02/06/11    FAMILY HISTORY: Family History  Problem Relation Age of Onset  . Heart disease Brother   . Cancer Brother     gland cancer  . Diabetes Sister   . Breast cancer Sister   . Stroke Mother   . Stroke Father   . Colon cancer Neg Hx   . Stomach cancer Neg Hx   . Pancreatic cancer Neg Hx   . Prostate cancer Father   . Kidney disease Neg Hx   . Liver disease Neg Hx     SOCIAL HISTORY: Social History   Social History  . Marital Status: Married    Spouse Name: Lorin Picket. Agner  . Number of Children: 2  . Years of Education: 12   Occupational History  . part time preschool teacher-retired    Social History Main Topics  . Smoking status: Never Smoker   . Smokeless tobacco: Never Used  . Alcohol Use: No  . Drug Use: No  . Sexual Activity: Yes    Birth Control/ Protection: Post-menopausal   Other Topics Concern  . Not on file   Social History Narrative   Patient lives at home with her husband Maisie Fus). Patient is  retired. Patient  Has 12 th grade education.    Caffeine- sometimes- One cup of coffee.   Right handed.  Four granddaughters.     PHYSICAL EXAM  Filed Vitals:   10/12/14 1152  BP: 163/78  Pulse: 64  Height: 4\' 10"  (1.473 m)  Weight: 135 lb (61.236 kg)   Body mass index is 28.22 kg/(m^2). PHYSICAL EXAMNIATION:  Gen: NAD, conversant, well nourised, obese, well groomed                     Cardiovascular: Regular rate rhythm, no peripheral edema, warm, nontender. Eyes: Conjunctivae clear without exudates or hemorrhage Neck: Supple, no carotid bruise. Tenderness along right nuchal line Pulmonary: Clear to auscultation bilaterally    NEUROLOGICAL EXAM:  MENTAL STATUS: Speech:    Speech is normal; fluent and spontaneous with normal comprehension.  Cognition:    The patient is oriented to person, place, and time;     recent and remote memory intact;     language fluent;     normal attention, concentration,     fund of knowledge.  CRANIAL NERVES: CN II: Visual fields are full to confrontation. Fundoscopic exam is normal with sharp discs and no vascular changes. Venous pulsations are present bilaterally. Pupils are 4 mm and briskly reactive to light. Visual acuity is 20/20 bilaterally. CN III, IV, VI: extraocular movement are normal. No ptosis. CN V: Facial sensation is intact to pinprick in all 3 divisions bilaterally. Corneal responses are intact.  CN VII: Face is symmetric with normal eye closure and smile. CN VIII: Hearing is normal to rubbing fingers CN IX, X: Palate elevates symmetrically. Phonation is normal. CN XI: Head turning and shoulder shrug are intact CN XII: Tongue is midline with normal movements and no atrophy. Small oropharyngeal.  MOTOR: There is no pronator drift of out-stretched arms. Muscle bulk and tone are normal. Muscle strength is normal.   REFLEXES: Reflexes are 2+ and symmetric at the biceps, triceps, knees, and ankles. Plantar responses are  flexor.  SENSORY: Light touch, pinprick, position sense, and vibration sense are intact in fingers and toes.  COORDINATION: Rapid alternating movements and fine finger movements are intact. There is no dysmetria on finger-to-nose and heel-knee-shin. There are no abnormal or extraneous movements.   GAIT/STANCE: Posture is normal. Gait is steady with normal steps, base, arm swing, and turning. Heel and toe walking are normal. Tandem gait is normal.  Romberg is absent.    DIAGNOSTIC DATA (LABS, IMAGING, TESTING) - I reviewed patient records, labs, notes, testing and imaging myself where available.  Lab Results  Component Value Date   WBC 7.8 08/02/2014   HGB 13.0 08/02/2014   HCT 38.5 08/02/2014   MCV 90.6 08/02/2014   PLT 281 08/02/2014      Component Value Date/Time   NA 136 08/02/2014 1258   NA 133* 05/16/2014 0326   K 4.4 08/02/2014 1258   K 4.3 05/16/2014 0326   CL 95* 05/16/2014 0326   CL 95* 07/28/2012 0941   CO2 29 08/02/2014 1258   CO2 30 05/16/2014 0326   GLUCOSE 147* 08/02/2014 1258   GLUCOSE 106* 05/16/2014 0326   GLUCOSE 108* 07/28/2012 0941   BUN 18.1 08/02/2014 1258   BUN 13 05/16/2014 0326   CREATININE 0.7 08/02/2014 1258   CREATININE 0.71 05/16/2014 0326   CALCIUM 9.5 08/02/2014 1258   CALCIUM 9.2 05/16/2014 0326   PROT 7.4 08/02/2014 1258   PROT 7.8 05/04/2014 0850   ALBUMIN 4.2 08/02/2014 1258   ALBUMIN 4.5 05/04/2014 0850   AST 21 08/02/2014 1258   AST 26 05/04/2014 0850  ALT 22 08/02/2014 1258   ALT 22 05/04/2014 0850   ALKPHOS 84 08/02/2014 1258   ALKPHOS 81 05/04/2014 0850   BILITOT 1.07 08/02/2014 1258   BILITOT 1.1 05/04/2014 0850   GFRNONAA 82* 05/16/2014 0326   GFRAA >90 05/16/2014 0326    ASSESSMENT AND PLAN  79 y.o. year old female  with past medical history of hypertension, migraine headaches, Obstructive sleep apnea  Follow-up with Dr. Rexene [name] Frequent headaches, tenderness along right nuchal line, consistent with right  occipital neuralgia,  I have discussed option of right occipital nerve block,  If she continued to have headaches, will contact our office for occipital nerve block  When necessary NSAIDs  Marcial Pacas, M.D. Ph.D.  [facility] [place], [place] [address] Phone: [phone] Fax:      [phone]

## 2014-10-18 ENCOUNTER — Other Ambulatory Visit: Payer: Self-pay | Admitting: Pulmonary Disease

## 2014-10-18 DIAGNOSIS — S13120A Subluxation of C1/C2 cervical vertebrae, initial encounter: Secondary | ICD-10-CM | POA: Diagnosis not present

## 2014-10-18 DIAGNOSIS — M5412 Radiculopathy, cervical region: Secondary | ICD-10-CM | POA: Diagnosis not present

## 2014-10-18 DIAGNOSIS — R51 Headache: Secondary | ICD-10-CM | POA: Diagnosis not present

## 2014-10-18 DIAGNOSIS — M9901 Segmental and somatic dysfunction of cervical region: Secondary | ICD-10-CM | POA: Diagnosis not present

## 2014-10-18 DIAGNOSIS — M5032 Other cervical disc degeneration, mid-cervical region: Secondary | ICD-10-CM | POA: Diagnosis not present

## 2014-10-19 NOTE — Telephone Encounter (Signed)
Per SN, ok to refill  Refill called into pt's pharmacy  Nothing further is needed

## 2014-10-21 ENCOUNTER — Other Ambulatory Visit: Payer: Self-pay

## 2014-10-21 MED ORDER — METOPROLOL TARTRATE 50 MG PO TABS: 50.0000 mg | ORAL_TABLET | Freq: Three times a day (TID) | ORAL | Status: DC

## 2014-10-21 NOTE — Telephone Encounter (Signed)
Josue Hector, MD at 09/26/2014 8:12 AM  metoprolol (LOPRESSOR) 50 MG tablet Take 1 tablet (50 mg total) by mouth 3 (three) times daily         Medication Instructions:  NO CHANGES

## 2014-10-27 ENCOUNTER — Ambulatory Visit (INDEPENDENT_AMBULATORY_CARE_PROVIDER_SITE_OTHER): Payer: Medicare Other | Admitting: Neurology

## 2014-10-27 ENCOUNTER — Encounter: Payer: Self-pay | Admitting: Neurology

## 2014-10-27 VITALS — BP 132/60 | HR 72 | Resp 16 | Ht <= 58 in | Wt 134.0 lb

## 2014-10-27 DIAGNOSIS — E663 Overweight: Secondary | ICD-10-CM

## 2014-10-27 DIAGNOSIS — I2510 Atherosclerotic heart disease of native coronary artery without angina pectoris: Secondary | ICD-10-CM | POA: Diagnosis not present

## 2014-10-27 DIAGNOSIS — J301 Allergic rhinitis due to pollen: Secondary | ICD-10-CM | POA: Diagnosis not present

## 2014-10-27 DIAGNOSIS — Z9989 Dependence on other enabling machines and devices: Principal | ICD-10-CM

## 2014-10-27 DIAGNOSIS — J3089 Other allergic rhinitis: Secondary | ICD-10-CM | POA: Diagnosis not present

## 2014-10-27 DIAGNOSIS — L219 Seborrheic dermatitis, unspecified: Secondary | ICD-10-CM | POA: Diagnosis not present

## 2014-10-27 DIAGNOSIS — G4733 Obstructive sleep apnea (adult) (pediatric): Secondary | ICD-10-CM | POA: Diagnosis not present

## 2014-10-27 NOTE — Patient Instructions (Signed)

## 2014-10-27 NOTE — Progress Notes (Signed)
Subjective:    Patient ID: [name] is a 79 y.o. female.  HPI     Interim history:  Dear [name] and [name],   I saw your patient, [name], upon your kind request in my clinic today for initial consultation after her recent sleep study. The patient is unaccompanied today. As you know, [name] is a very pleasant 79 year old right-handed woman with an underlying medical history of fibromyalgia, coronary artery disease, status post CABG, irritable bowel syndrome, diverticulosis, breast cancer, hyperlipidemia, glaucoma, hypertension and overweight state, who was referred for sleep study. She reported morning headaches, nocturia, nonrestorative sleep and excessive daytime somnolence as well as snoring. She had a split-night sleep study on 04/25/2014 and underwent over her test results with her in detail today. Her baseline sleep efficiency was reduced at 53.5% with a latency to sleep of 42.5 minutes and wake after sleep onset of 61 minutes with moderate to severe sleep fragmentation noted. She had a markedly elevated arousal index. She had an increased percentage of light stage sleep and absence of slow-wave sleep and REM sleep. She had PLMS at 62 per hour, with minimal arousals. She had mild to moderate snoring. Total AHI was 16.1 per hour. Average oxygen saturation was 95%, nadir was 85%. She was then titrated on CPAP. Sleep efficiency was 83.3%, latency to sleep 9 minutes and wake after sleep onset 16.5 minutes with mild sleep fragmentation noted. She had slow-wave sleep at 8.3% in rem sleep at 25.6%. Average oxygen saturation was 98%, nadir was 96%. Snoring was eliminated. She had a PLM index of 43.5 per hour with no arousals. She was titrated from 5 cm to 6 cm. AHI was 0 per hour on the final pressure with supine REM sleep achieved. Based on her test results are prescribed CPAP therapy for home use.  Today, 10/27/2014: I reviewed her CPAP compliance data from 09/26/2014 through  10/25/2014 which is a total of 30 days during which time she used her machine every night with percent used days greater than 4 hours at 97%, indicating excellent compliance with an average usage of 7 hours and 4 minutes, residual AHI low at syrup 0.5 per hour, leak low with the 95th percentile at 3.8 L/m on a pressure of 6 cm.  Today, 10/27/2014: She reports doing fairly well. Overall, she feels a little bit better rested when she uses CPAP and still is struggling somewhat with the mask. She uses nasal pillows. She started CPAP therapy in mid June. She had some questions about the machine and how to clean the house and so forth and I tried to answer them but I also asked her to get in touch with her DME company, [employer]. As far as her bedtime goes, she tries to be in bed between 10 and 10:30. Her rise time is 6 to 6:30 AM. She used to have restless leg symptoms even as a child. Her husband has reported to her that her twitching has improved. She does not endorse bothersome restless leg symptoms at this time. Her headaches are about the same. Her nocturia is still about the same, 2-3 times in an average night. She does not drink alcohol and does not drink caffeine on a regular basis and is a nonsmoker. She lives with her husband. She has 2 grown daughters and 3 granddaughters.  Her Past Medical History Is Significant For: Past Medical History  Diagnosis Date  . Diverticulosis of colon (without mention of hemorrhage)   . Irritable  bowel syndrome   . Colonic polyp 04-27-2009    tubular adenoma  . Headache(784.0)     a. frequently assocaited with high BPs  . Breast cancer, Left 12/20/2010    NO BLOOD PRESSURE CHECKS OR STICKS IN LEFT ARM  . Gallstones   . CAD (coronary artery disease)     a. 01/19/2010 s/p CABG x 3, lima->lad, vg->diag, vg->om1;  b. 06/2011 :Lexi MV: EF 84%, No ischemia. c. 01/06/14 s/p negative nuclear stress test with EF >70%  . Fibromyalgia   . Atrophic vaginitis   . GERD  (gastroesophageal reflux disease)   . Hypercholesterolemia   . Glaucoma   . Cervical arthritis   . Hiatal hernia   . Labile hypertension   . Asthma     Her Past Surgical History Is Significant For: Past Surgical History  Procedure Laterality Date  . Coronary angioplasty with stent placement      Stent 2007  . Open heart surgery      01/19/2010  . Coronary artery bypass graft    . Cataract extraction, bilateral      bilateral caaract removal,  . Breast lumpectomy Left 02/06/11    Her Family History Is Significant For: Family History  Problem Relation Age of Onset  . Heart disease Brother   . Cancer Brother     gland cancer  . Diabetes Sister   . Breast cancer Sister   . Stroke Mother   . Stroke Father   . Colon cancer Neg Hx   . Stomach cancer Neg Hx   . Pancreatic cancer Neg Hx   . Prostate cancer Father   . Kidney disease Neg Hx   . Liver disease Neg Hx     Her Social History Is Significant For: Social History   Social History  . Marital Status: Married    Spouse Name: [name]. [name]  . Number of Children: 2  . Years of Education: 12   Occupational History  . part time preschool teacher-retired    Social History Main Topics  . Smoking status: Never Smoker   . Smokeless tobacco: Never Used  . Alcohol Use: No  . Drug Use: No  . Sexual Activity: Yes    Birth Control/ Protection: Post-menopausal   Other Topics Concern  . None   Social History Narrative   Patient lives at home with her husband [name]). Patient is retired. Patient  Has 12 th grade education.    Caffeine- sometimes- One cup of coffee.   Right handed.   Four granddaughters.    Her Allergies Are:  Allergies  Allergen Reactions  . Choline Fenofibrate Other (See Comments)     pt states INTOL to Trilipix w/ "thigh burning"  . Simvastatin Other (See Comments)     pt states INTOL to STATINS \\T \ refuses to restart  . Hctz [Hydrochlorothiazide]     Causes hyponatremia  . Hydrocodone      Nightmare after taking cough syrup w/hydrocodone  . Levaquin [Levofloxacin In D5w]     Elevated BP  . Adhesive [Tape] Rash  . Ceclor [Cefaclor] Rash  . Clarithromycin Rash  . Codeine Nausea Only  . Doxycycline Rash  . Lisinopril Cough    Developed ACE cough...  . Penicillins Itching and Rash    At injection site  . Tobramycin-Dexamethasone Rash  :   Her Current Medications Are:  Outpatient Encounter Prescriptions as of 10/27/2014  Medication Sig  . Acetaminophen (TYLENOL 8 HOUR PO) Take by mouth as needed.  [name]  aspirin EC 81 MG tablet Take 81 mg by mouth daily.  . fexofenadine (ALLEGRA) 180 MG tablet Take 180 mg by mouth daily.    . fluocinonide (LIDEX) 0.05 % external solution Apply 1 application topically daily as needed (scalp itching).   . Glucosamine-Chondroit-Vit C-Mn (GLUCOSAMINE CHONDR 1500 COMPLX PO) Take 1 tablet by mouth 3 (three) times daily.    . hydrALAZINE (APRESOLINE) 50 MG tablet Take 1/2 tablet by mouth three times daily  . hyoscyamine (ANASPAZ) 0.125 MG TBDP disintergrating tablet Place 0.125 mg under the tongue every 6 (six) hours as needed for bladder spasms or cramping.  [name] ketoconazole (NIZORAL) 2 % shampoo Apply 1 application topically 2 (two) times a week. As needed for itching a couple of times week  . latanoprost (XALATAN) 0.005 % ophthalmic solution Place 1 drop into both eyes at bedtime.   [name] letrozole (FEMARA) 2.5 MG tablet Take 1 tablet (2.5 mg total) by mouth daily.  [name] losartan (COZAAR) 100 MG tablet Take 0.5 tablets (50 mg total) by mouth 3 (three) times daily. Take 1/2 tablet by mouth three times daily  . magnesium oxide (MAG-OX) 400 MG tablet Take 400 mg by mouth daily.  . metoprolol (LOPRESSOR) 50 MG tablet Take 1 tablet (50 mg total) by mouth 3 (three) times daily.  . mometasone (NASONEX) 50 MCG/ACT nasal spray Place 2 sprays into the nose daily as needed (congestion).   . nitroGLYCERIN (NITROSTAT) 0.4 MG SL tablet Place 1 tablet (0.4 mg total)  under the tongue every 5 (five) minutes as needed for chest pain.  . Omega-3 Fatty Acids (FISH OIL) 1200 MG CAPS Take 1,200 mg by mouth daily.   [name] omeprazole (PRILOSEC) 40 MG capsule take 1 capsule by mouth once daily 30 MINUTES BEFORE 1ST MEAL OF THE DAY  . PRESCRIPTION MEDICATION Vitamin B 2  . ranitidine (ZANTAC) 300 MG capsule Take 1 capsule (300 mg total) by mouth every evening.  . traMADol (ULTRAM) 50 MG tablet take 1 tablet by mouth three times a day if needed  . vitamin B-12 (CYANOCOBALAMIN) 1000 MCG tablet Take 1,000 mcg by mouth daily.     No facility-administered encounter medications on file as of 10/27/2014.  :  Review of Systems:  Out of a complete 14 point review of systems, all are reviewed and negative with the exception of these symptoms as listed below:   Review of Systems  Neurological:       Patient states that she does ok on CPAP. Reports some trouble with nasal mask. She feels like she may need a different type of mask.      Objective:  Neurologic Exam  Physical Exam Physical Examination:   Filed Vitals:   10/27/14 1337  BP: 132/60  Pulse: 72  Resp: 16   General Examination: The patient is a very pleasant 79 y.o. female in no acute distress. She appears well-developed and well-nourished and well groomed.   HEENT: Normocephalic, atraumatic, pupils are equal, round and reactive to light and accommodation. Funduscopic exam is normal with sharp disc margins noted. Extraocular tracking is good without limitation to gaze excursion or nystagmus noted. Normal smooth pursuit is noted. Hearing is grossly intact. Tympanic membranes are clear bilaterally. Face is symmetric with normal facial animation and normal facial sensation. Speech is clear with no dysarthria noted. There is no hypophonia. There is no lip, neck/head, jaw or voice tremor. Neck is supple with full range of passive and active motion. There are no carotid bruits on auscultation.  Oropharynx exam reveals:  mild mouth dryness, adequate dental hygiene and mild airway crowding, due to narrow airway entry and floppy appearing soft palate. Mallampati is class II. Tongue protrudes centrally and palate elevates symmetrically. Tonsils are small. Neck size is 14 inches. She has a minimal overbite. Nasal inspection reveals no significant nasal mucosal bogginess or redness and no septal deviation.   Chest: Clear to auscultation without wheezing, rhonchi or crackles noted.  Heart: S1+S2+0, regular and normal without murmurs, rubs or gallops noted.   Abdomen: Soft, non-tender and non-distended with normal bowel sounds appreciated on auscultation.  Extremities: There is no pitting edema in the distal lower extremities bilaterally. Pedal pulses are intact.  Skin: Warm and dry without trophic changes noted. There are no varicose veins.  Musculoskeletal: exam reveals no obvious joint deformities, tenderness or joint swelling or erythema.   Neurologically:  Mental status: The patient is awake, alert and oriented in all 4 spheres. Her immediate and remote memory, attention, language skills and fund of knowledge are appropriate. There is no evidence of aphasia, agnosia, apraxia or anomia. Speech is clear with normal prosody and enunciation. Thought process is linear. Mood is normal and affect is normal.  Cranial nerves II - XII are as described above under HEENT exam. In addition: shoulder shrug is normal with equal shoulder height noted. Motor exam: Normal bulk, strength and tone is noted. There is no drift, tremor or rebound. Romberg is negative. Reflexes are 2+ throughout. Babinski: Toes are flexor bilaterally. Fine motor skills and coordination: intact with normal finger taps, normal hand movements, normal rapid alternating patting, normal foot taps and normal foot agility.  Cerebellar testing: No dysmetria or intention tremor on finger to nose testing. Heel to shin is unremarkable bilaterally. There is no truncal  or gait ataxia.  Sensory exam: intact to light touch, pinprick, vibration, temperature sense in the upper and lower extremities.  Gait, station and balance: She stands easily. No veering to one side is noted. No leaning to one side is noted. Posture is age-appropriate and stance is narrow based. Gait shows normal stride length and normal pace. No problems turning are noted. She turns in 3 steps. Tandem walk is slightly difficult for her.   Assessment and Plan:   In summary, TOYIA JELINEK is a very pleasant 79 y.o.-year old female with an underlying medical history of fibromyalgia, coronary artery disease, status post CABG, irritable bowel syndrome, diverticulosis, breast cancer, hyperlipidemia, glaucoma, hypertension and overweight state, who has been diagnosed with obstructive sleep apnea and has been using CPAP since June 2016. She is advised about her sleep test results in detail today. She is fully compliant with treatment and commended for her treatment adherence. She has noted some improvement in her daytime energy level but her headaches are about the same at this time. She is scheduled for an appointment with Dr. Delice Lesch later this month for second opinion she states. I had a long chat with the patient about my findings and the diagnosis of OSA, its prognosis and treatment options. We talked about medical treatments, surgical interventions and non-pharmacological approaches. I explained in particular the risks and ramifications of untreated moderate to severe OSA, especially with respect to developing cardiovascular disease down the Road, including congestive heart failure, difficult to treat hypertension, cardiac arrhythmias, or stroke. Even type 2 diabetes has, in part, been linked to untreated OSA. Symptoms of untreated OSA include daytime sleepiness, memory problems, mood irritability and mood disorder such as depression and anxiety, lack  of energy, as well as recurrent headaches, especially  morning headaches. I explained the importance of being compliant with PAP treatment, not only for insurance purposes but primarily to improve Her symptoms, and for the patient's long term health benefit, including to reduce Her cardiovascular risks. She is encouraged to continue with treatment at this time and advised that sometimes improvement does take about 3 months to be noticeable by the patient's. She is motivated to continue with treatment. From my end of things, I will see her back in about 6 months, sooner if the need arises. I answered all her questions today and the patient was in agreement. Thank you very much for allowing me to participate in the care of this nice patient. If I can be of any further assistance to you please do not hesitate to talk to me.   Sincerely,   Star Age, MD, PhD

## 2014-11-01 DIAGNOSIS — M9900 Segmental and somatic dysfunction of head region: Secondary | ICD-10-CM | POA: Diagnosis not present

## 2014-11-01 DIAGNOSIS — M5032 Other cervical disc degeneration, mid-cervical region: Secondary | ICD-10-CM | POA: Diagnosis not present

## 2014-11-01 DIAGNOSIS — M5412 Radiculopathy, cervical region: Secondary | ICD-10-CM | POA: Diagnosis not present

## 2014-11-01 DIAGNOSIS — R51 Headache: Secondary | ICD-10-CM | POA: Diagnosis not present

## 2014-11-01 DIAGNOSIS — M9901 Segmental and somatic dysfunction of cervical region: Secondary | ICD-10-CM | POA: Diagnosis not present

## 2014-11-10 ENCOUNTER — Telehealth: Payer: Self-pay | Admitting: Pulmonary Disease

## 2014-11-10 MED ORDER — CIPROFLOXACIN HCL 500 MG PO TABS: 500.0000 mg | ORAL_TABLET | Freq: Two times a day (BID) | ORAL | Status: DC

## 2014-11-10 MED ORDER — CIPROFLOXACIN HCL 250 MG PO TABS: 250.0000 mg | ORAL_TABLET | Freq: Two times a day (BID) | ORAL | Status: DC

## 2014-11-10 NOTE — Telephone Encounter (Signed)
Called and spoke to pt. Informed her of the recs per SN. Rx sent to preferred pharmacy. Pt verbalized understanding and denied any further questions or concerns at this time.   

## 2014-11-10 NOTE — Telephone Encounter (Signed)
Per SN, Call in cipro 500mg  #14 with instructions of 1 PO BID until gone

## 2014-11-10 NOTE — Telephone Encounter (Signed)
Per SN, Inform pt that adverse reaction we have listed to levaquin is elevated BP. SN feels that it is very unlikely that pt will have same reaction to oral cipro Call in cipro 250mg  #14 with instructions to take 1 PO BID x 7 days  Called and spoke with pt and informed of the above Pt voiced understanding and expressed no further concerns  Nothing else is needed at this time

## 2014-11-10 NOTE — Telephone Encounter (Signed)
Patient having a lot of pressure in bladder, burning with urination x 4 days.  Patient took Bokoshe on Monday, but it didn't help.   Wants something called in for infection.  Having a lot of nausea  Allergies  Allergen Reactions  . Choline Fenofibrate Other (See Comments)     pt states INTOL to Trilipix w/ "thigh burning"  . Simvastatin Other (See Comments)     pt states INTOL to STATINS \\T \ refuses to restart  . Hctz [Hydrochlorothiazide]     Causes hyponatremia  . Hydrocodone     Nightmare after taking cough syrup w/hydrocodone  . Levaquin [Levofloxacin In D5w]     Elevated BP  . Adhesive [Tape] Rash  . Ceclor [Cefaclor] Rash  . Clarithromycin Rash  . Codeine Nausea Only  . Doxycycline Rash  . Lisinopril Cough    Developed ACE cough...  . Penicillins Itching and Rash    At injection site  . Tobramycin-Dexamethasone Rash

## 2014-11-10 NOTE — Telephone Encounter (Signed)
Called spoke with pt. We called in cipro for her. She called the pharm and they told her to call us since she is allergic to levaquin. She has had this IV and tablet form and was allergic to levaquin. She is concerned about taking the cipro. She reports she took a very low dose in the past but that was before she found out she was allergic to levaquin. Please advise SN thanks  Previous message also printed.

## 2014-11-11 DIAGNOSIS — J301 Allergic rhinitis due to pollen: Secondary | ICD-10-CM | POA: Diagnosis not present

## 2014-11-11 DIAGNOSIS — J3089 Other allergic rhinitis: Secondary | ICD-10-CM | POA: Diagnosis not present

## 2014-11-17 DIAGNOSIS — H40013 Open angle with borderline findings, low risk, bilateral: Secondary | ICD-10-CM | POA: Diagnosis not present

## 2014-11-22 DIAGNOSIS — M9901 Segmental and somatic dysfunction of cervical region: Secondary | ICD-10-CM | POA: Diagnosis not present

## 2014-11-22 DIAGNOSIS — M797 Fibromyalgia: Secondary | ICD-10-CM | POA: Diagnosis not present

## 2014-11-22 DIAGNOSIS — M542 Cervicalgia: Secondary | ICD-10-CM | POA: Diagnosis not present

## 2014-11-23 ENCOUNTER — Encounter: Payer: Self-pay | Admitting: Neurology

## 2014-11-23 ENCOUNTER — Ambulatory Visit (INDEPENDENT_AMBULATORY_CARE_PROVIDER_SITE_OTHER): Payer: Medicare Other | Admitting: Neurology

## 2014-11-23 VITALS — BP 136/72 | HR 61 | Resp 16 | Ht <= 58 in | Wt 134.0 lb

## 2014-11-23 DIAGNOSIS — I2510 Atherosclerotic heart disease of native coronary artery without angina pectoris: Secondary | ICD-10-CM | POA: Diagnosis not present

## 2014-11-23 DIAGNOSIS — Z9989 Dependence on other enabling machines and devices: Secondary | ICD-10-CM

## 2014-11-23 DIAGNOSIS — M5481 Occipital neuralgia: Secondary | ICD-10-CM | POA: Diagnosis not present

## 2014-11-23 DIAGNOSIS — G4733 Obstructive sleep apnea (adult) (pediatric): Secondary | ICD-10-CM | POA: Diagnosis not present

## 2014-11-23 DIAGNOSIS — G4486 Cervicogenic headache: Secondary | ICD-10-CM

## 2014-11-23 DIAGNOSIS — J301 Allergic rhinitis due to pollen: Secondary | ICD-10-CM | POA: Diagnosis not present

## 2014-11-23 DIAGNOSIS — J3089 Other allergic rhinitis: Secondary | ICD-10-CM | POA: Diagnosis not present

## 2014-11-23 DIAGNOSIS — M542 Cervicalgia: Secondary | ICD-10-CM

## 2014-11-23 DIAGNOSIS — R51 Headache: Secondary | ICD-10-CM | POA: Diagnosis not present

## 2014-11-23 NOTE — Patient Instructions (Signed)
1. Here are your options:  - would highly recommend doing Physical Therapy for neck pain leading to headaches in the back of head  - occipital nerve blocks  - oral medication Gabapentin to help quiet down nerve irritation, we can start with low dose and monitor for drowsiness  2. Call our office regarding your decision, follow-up in 3 months

## 2014-11-23 NOTE — Progress Notes (Signed)
NEUROLOGY CONSULTATION NOTE  [name]: [id number] DOB: 04-20-1935  Referring provider: Dr. Jenkins Rouge Primary care provider: Dr. Teressa Lower  Reason for consult:  Headaches second opinion  Dear Dr Johnsie Cancel:  Thank you for your kind referral of [name] for consultation of the above symptoms. Although her history is well known to you, please allow me to reiterate it for the purpose of our medical record. Records and images were personally reviewed where available.  HISTORY OF PRESENT ILLNESS: This is a pleasant 79 year old right-handed woman with a history of hypertension, hyperlipidemia, CAD, breast cancer s/p lumpectomy, chemotherapy and radiation, presenting for second opinion for daily headaches. She had been seeing neurologist Dr. Krista Blue since 2014. Records were reviewed. [name] reports a history of migraines since her 80s. These quieted down, until 2014 when she started having headaches that she reports were different from her migraines in the past. It starts in the bilateral occipital region, her neck feels strange, "like it's about to break in the middle," then radiates to the frontal region like a tight band of pressure. This is associated with nausea. Her last migraine was 1 year ago. She was reporting 15 headaches days a month. MRI cervical spine had shown multilevel degenerative disc disease, mild right foraminal stenosis at C3-4 secondary to asymmetric right-sided facet hypertrophy and uncovertebral disease, facet hypertrophy and disc osteophyte complexes at C4-5 with mild central canal narrowing, mild to moderate left central canal stenosis C5-6 without significant foraminal disease, mild central and left foraminal stenosis at C6-7 secondary to a broad-based disc osteophyte complex and uncovertebral disease, mild left foraminal narrowing at C7-C1 secondary to facet disease. ESR normal. In 2015, she was admitted for hyponatremia and headaches. Headaches had  increased in frequency, described as a burning and pressure sensation. Ultram would help some. She was noted to have elevated blood pressure during times of headaches. According to notes, on her last visit in August 2016, she had reported improvement in the headaches but still frequent, and was offered the option of an occipital nerve block. She presents today for second opinion.  She reports having the headaches constantly, waxing and waning in intensity from 3/10 to 10/10. She takes Tramadol or Arthritis Tylenol, applying ice helps. She chews ginger for the nausea. In the past, seeing her chiropractor used to help, but not anymore. She had PT for neck pain in 2015 and was told her neck was so tight. She feels slightly better lying down. She has occasional right hand numbness, as well as right leg numbness. She denies any diplopia, dysarthria, dysphagia, bowel/bladder dysfunction. She has had balance problems but denies any recent falls in the past year. She reports that her BP is overall better except this week when she has had more intense headaches. She started using a CPAP machine last June but has not noticed much change and still does not feel rested on awakening.   Laboratory Data: Lab Results  Component Value Date   WBC 7.8 08/02/2014   HGB 13.0 08/02/2014   HCT 38.5 08/02/2014   MCV 90.6 08/02/2014   PLT 281 08/02/2014     Chemistry      Component Value Date/Time   NA 136 08/02/2014 1258   NA 133* 05/16/2014 0326   K 4.4 08/02/2014 1258   K 4.3 05/16/2014 0326   CL 95* 05/16/2014 0326   CL 95* 07/28/2012 0941   CO2 29 08/02/2014 1258   CO2 30 05/16/2014 0326  BUN 18.1 08/02/2014 1258   BUN 13 05/16/2014 0326   CREATININE 0.7 08/02/2014 1258   CREATININE 0.71 05/16/2014 0326      Component Value Date/Time   CALCIUM 9.5 08/02/2014 1258   CALCIUM 9.2 05/16/2014 0326   ALKPHOS 84 08/02/2014 1258   ALKPHOS 81 05/04/2014 0850   AST 21 08/02/2014 1258   AST 26 05/04/2014 0850     ALT 22 08/02/2014 1258   ALT 22 05/04/2014 0850   BILITOT 1.07 08/02/2014 1258   BILITOT 1.1 05/04/2014 0850     Lab Results  Component Value Date   ESRSEDRATE 6 11/06/2012     PAST MEDICAL HISTORY: Past Medical History  Diagnosis Date  . Diverticulosis of colon (without mention of hemorrhage)   . Irritable bowel syndrome   . Colonic polyp 04-27-2009    tubular adenoma  . Headache(784.0)     a. frequently assocaited with high BPs  . Breast cancer, Left 12/20/2010    NO BLOOD PRESSURE CHECKS OR STICKS IN LEFT ARM  . Gallstones   . CAD (coronary artery disease)     a. 01/19/2010 s/p CABG x 3, lima->lad, vg->diag, vg->om1;  b. 06/2011 :Lexi MV: EF 84%, No ischemia. c. 01/06/14 s/p negative nuclear stress test with EF >70%  . Fibromyalgia   . Atrophic vaginitis   . GERD (gastroesophageal reflux disease)   . Hypercholesterolemia   . Glaucoma   . Cervical arthritis   . Hiatal hernia   . Labile hypertension   . Asthma     PAST SURGICAL HISTORY: Past Surgical History  Procedure Laterality Date  . Coronary angioplasty with stent placement      Stent 2007  . Open heart surgery      01/19/2010  . Coronary artery bypass graft    . Cataract extraction, bilateral      bilateral caaract removal,  . Breast lumpectomy Left 02/06/11    MEDICATIONS: Current Outpatient Prescriptions on File Prior to Visit  Medication Sig Dispense Refill  . Acetaminophen (TYLENOL 8 HOUR PO) Take by mouth as needed.    [name] aspirin EC 81 MG tablet Take 81 mg by mouth daily.    . ciprofloxacin (CIPRO) 250 MG tablet Take 1 tablet (250 mg total) by mouth 2 (two) times daily. 14 tablet 0  . fexofenadine (ALLEGRA) 180 MG tablet Take 180 mg by mouth daily.      . fluocinonide (LIDEX) 0.05 % external solution Apply 1 application topically daily as needed (scalp itching).     . Glucosamine-Chondroit-Vit C-Mn (GLUCOSAMINE CHONDR 1500 COMPLX PO) Take 1 tablet by mouth 3 (three) times daily.      . hydrALAZINE  (APRESOLINE) 50 MG tablet Take 1/2 tablet by mouth three times daily    . hyoscyamine (ANASPAZ) 0.125 MG TBDP disintergrating tablet Place 0.125 mg under the tongue every 6 (six) hours as needed for bladder spasms or cramping.    [name] ketoconazole (NIZORAL) 2 % shampoo Apply 1 application topically 2 (two) times a week. As needed for itching a couple of times week    . latanoprost (XALATAN) 0.005 % ophthalmic solution Place 1 drop into both eyes at bedtime.     [name] letrozole (FEMARA) 2.5 MG tablet Take 1 tablet (2.5 mg total) by mouth daily. 90 tablet 3  . losartan (COZAAR) 100 MG tablet Take 0.5 tablets (50 mg total) by mouth 3 (three) times daily. Take 1/2 tablet by mouth three times daily 45 tablet 5  . magnesium oxide (MAG-OX)  400 MG tablet Take 400 mg by mouth daily.    . metoprolol (LOPRESSOR) 50 MG tablet Take 1 tablet (50 mg total) by mouth 3 (three) times daily. 270 tablet 2  . mometasone (NASONEX) 50 MCG/ACT nasal spray Place 2 sprays into the nose daily as needed (congestion).     . Omega-3 Fatty Acids (FISH OIL) 1200 MG CAPS Take 1,200 mg by mouth daily.     [name] omeprazole (PRILOSEC) 40 MG capsule take 1 capsule by mouth once daily 30 MINUTES BEFORE 1ST MEAL OF THE DAY 30 capsule 4  . PRESCRIPTION MEDICATION Vitamin B 2    . ranitidine (ZANTAC) 300 MG capsule Take 1 capsule (300 mg total) by mouth every evening. 30 capsule 3  . traMADol (ULTRAM) 50 MG tablet take 1 tablet by mouth three times a day if needed 90 tablet 5  . vitamin B-12 (CYANOCOBALAMIN) 1000 MCG tablet Take 1,000 mcg by mouth daily.      . nitroGLYCERIN (NITROSTAT) 0.4 MG SL tablet Place 1 tablet (0.4 mg total) under the tongue every 5 (five) minutes as needed for chest pain. (Patient not taking: Reported on 11/23/2014) 25 tablet 3   No current facility-administered medications on file prior to visit.    ALLERGIES: Allergies  Allergen Reactions  . Choline Fenofibrate Other (See Comments)     pt states INTOL to Trilipix w/  "thigh burning"  . Simvastatin Other (See Comments)     pt states INTOL to STATINS$RemoveBeforeD' \\T'[id number]$ \ refuses to restart  . Hctz [Hydrochlorothiazide]     Causes hyponatremia  . Hydrocodone     Nightmare after taking cough syrup w/hydrocodone  . Levaquin [Levofloxacin In D5w]     Elevated BP  . Adhesive [Tape] Rash  . Ceclor [Cefaclor] Rash  . Clarithromycin Rash  . Codeine Nausea Only  . Doxycycline Rash  . Lisinopril Cough    Developed ACE cough...  . Penicillins Itching and Rash    At injection site  . Tobramycin-Dexamethasone Rash    FAMILY HISTORY: Family History  Problem Relation Age of Onset  . Heart disease Brother   . Cancer Brother     gland cancer  . Diabetes Sister   . Breast cancer Sister   . Stroke Mother   . Stroke Father   . Colon cancer Neg Hx   . Stomach cancer Neg Hx   . Pancreatic cancer Neg Hx   . Prostate cancer Father   . Kidney disease Neg Hx   . Liver disease Neg Hx     SOCIAL HISTORY: Social History   Social History  . Marital Status: Married    Spouse Name: Gildardo Griffes. [place]  . Number of Children: 2  . Years of Education: 12   Occupational History  . part time preschool teacher-retired    Social History Main Topics  . Smoking status: Never Smoker   . Smokeless tobacco: Never Used  . Alcohol Use: No  . Drug Use: No  . Sexual Activity: Yes    Birth Control/ Protection: Post-menopausal   Other Topics Concern  . Not on file   Social History Narrative   Patient lives at home with her husband Marcello Moores). Patient is retired. Patient  Has 12 th grade education.    Caffeine- sometimes- One cup of coffee.   Right handed.   Four granddaughters.    REVIEW OF SYSTEMS: Constitutional: No fevers, chills, or sweats, no generalized fatigue, change in appetite Eyes: No visual changes, double vision, eye pain  Ear, nose and throat: No hearing loss, ear pain, nasal congestion, sore throat Cardiovascular: No chest pain, palpitations Respiratory:  No  shortness of breath at rest or with exertion, wheezes GastrointestinaI: No nausea, vomiting, diarrhea, abdominal pain, fecal incontinence Genitourinary:  No dysuria, urinary retention or frequency Musculoskeletal:  + neck pain, back pain Integumentary: No rash, pruritus, skin lesions Neurological: as above Psychiatric: No depression, insomnia, anxiety Endocrine: No palpitations, fatigue, diaphoresis, mood swings, change in appetite, change in weight, increased thirst Hematologic/Lymphatic:  No anemia, purpura, petechiae. Allergic/Immunologic: no itchy/runny eyes, nasal congestion, recent allergic reactions, rashes  PHYSICAL EXAM: Filed Vitals:   11/23/14 0937  BP: 136/72  Pulse: 61  Resp: 16   General: No acute distress Head:  Normocephalic/atraumatic, no tenderness over the temporal regions, some tenderness over the bilateral occipital regions right>left Eyes: Fundoscopic exam shows bilateral sharp discs, no vessel changes, exudates, or hemorrhages Neck: supple, + paraspinal tenderness, full range of motion Back: No paraspinal tenderness Heart: regular rate and rhythm Lungs: Clear to auscultation bilaterally. Vascular: No carotid bruits. Skin/Extremities: No rash, no edema Neurological Exam: Mental status: alert and oriented to person, place, and time, no dysarthria or aphasia, Fund of knowledge is appropriate.  Recent and remote memory are intact.  Attention and concentration are normal.    Able to name objects and repeat phrases. Cranial nerves: CN I: not tested CN II: pupils equal, round and reactive to light, visual fields intact, fundi unremarkable. CN III, IV, VI:  full range of motion, no nystagmus, no ptosis CN V: facial sensation intact CN VII: upper and lower face symmetric CN VIII: hearing intact to finger rub CN IX, X: gag intact, uvula midline CN XI: sternocleidomastoid and trapezius muscles intact CN XII: tongue midline Bulk & Tone: normal, no  fasciculations. Motor: 5/5 throughout with no pronator drift. Sensation: intact to light touch, cold, pin, vibration and joint position sense.  No extinction to double simultaneous stimulation.  Romberg test negative Deep Tendon Reflexes: +1 throughout except for absent ankle jerks bilaterally, no ankle clonus Plantar responses: downgoing bilaterally Cerebellar: no incoordination on finger to nose, heel to shin. No dysdiadochokinesia Gait: slow and cautious, some difficulty with tandem walk but able Tremor: none  IMPRESSION: This is a pleasant 79 year old right-handed woman with a history of hypertension, sleep apnea, migraines, presenting for chronic daily headaches over the occipital and neck regions. History and exam suggestive of occipital neuralgia versus cervicogenic headaches. She does have degenerative disease on her cervical MRI. Dr. Krista Blue had previously discussed occipital nerve blocks with her, which I again recommended today. She may also benefit from physical therapy for cervicogenic headaches and neck pain, which I strongly recommended. We discussed the option of a daily preventative medication such as low dose gabapentin, side effects of sedation were discussed. She will consider her options and let us know her decision. She will follow-up in 3 months.   Thank you for allowing me to participate in the care of this patient. Please do not hesitate to call for any questions or concerns.   Ellouise Newer, M.D.  CC: Dr. Lenna Gilford, Dr. Johnsie Cancel

## 2014-12-02 DIAGNOSIS — M8589 Other specified disorders of bone density and structure, multiple sites: Secondary | ICD-10-CM | POA: Diagnosis not present

## 2014-12-07 DIAGNOSIS — J301 Allergic rhinitis due to pollen: Secondary | ICD-10-CM | POA: Diagnosis not present

## 2014-12-07 DIAGNOSIS — J3089 Other allergic rhinitis: Secondary | ICD-10-CM | POA: Diagnosis not present

## 2014-12-13 DIAGNOSIS — M542 Cervicalgia: Secondary | ICD-10-CM | POA: Diagnosis not present

## 2014-12-13 DIAGNOSIS — M797 Fibromyalgia: Secondary | ICD-10-CM | POA: Diagnosis not present

## 2014-12-13 DIAGNOSIS — R51 Headache: Secondary | ICD-10-CM | POA: Diagnosis not present

## 2014-12-13 DIAGNOSIS — M9901 Segmental and somatic dysfunction of cervical region: Secondary | ICD-10-CM | POA: Diagnosis not present

## 2014-12-14 ENCOUNTER — Telehealth: Payer: Self-pay | Admitting: Nurse Practitioner

## 2014-12-14 ENCOUNTER — Other Ambulatory Visit: Payer: Self-pay | Admitting: Pulmonary Disease

## 2014-12-14 NOTE — Telephone Encounter (Signed)
This patient may have already been informed of BMD through Dr. Virgie Dad office.  If already done [facility].  just reinforce precautions to prevent falls.    If not already informed:  Please let patient know that BMD completed on 12/02/14 shows a T Score at the spine of -1.8; left hip neck -1.6; right hip neck -1.4.  All of her measurements are in the Osteopenic range.  There was a decrease at most sites measured but still fairly good given her age and [facility] of Osteoporosis.  She still needs to maintain adequate calcium, Vit D and exercise program.  The FRAX score for major fracture in 10 years is 20 % (gaol is <20%).  The FRAX score for a fracture in 10 years for hip is 10% (goal is<3%).  She must take every precaution not to get on ladders, walk on uneven grounds or climb a lot of stairs.

## 2014-12-20 ENCOUNTER — Encounter: Payer: Self-pay | Admitting: Pulmonary Disease

## 2014-12-20 ENCOUNTER — Ambulatory Visit (INDEPENDENT_AMBULATORY_CARE_PROVIDER_SITE_OTHER): Payer: Medicare Other | Admitting: Pulmonary Disease

## 2014-12-20 VITALS — BP 124/70 | HR 62 | Temp 98.0°F | Wt 135.8 lb

## 2014-12-20 DIAGNOSIS — R232 Flushing: Secondary | ICD-10-CM | POA: Diagnosis not present

## 2014-12-20 DIAGNOSIS — R0989 Other specified symptoms and signs involving the circulatory and respiratory systems: Secondary | ICD-10-CM

## 2014-12-20 DIAGNOSIS — C50519 Malignant neoplasm of lower-outer quadrant of unspecified female breast: Secondary | ICD-10-CM

## 2014-12-20 DIAGNOSIS — J301 Allergic rhinitis due to pollen: Secondary | ICD-10-CM

## 2014-12-20 DIAGNOSIS — I2510 Atherosclerotic heart disease of native coronary artery without angina pectoris: Secondary | ICD-10-CM | POA: Diagnosis not present

## 2014-12-20 DIAGNOSIS — I10 Essential (primary) hypertension: Secondary | ICD-10-CM | POA: Diagnosis not present

## 2014-12-20 DIAGNOSIS — Z23 Encounter for immunization: Secondary | ICD-10-CM | POA: Diagnosis not present

## 2014-12-20 DIAGNOSIS — G4733 Obstructive sleep apnea (adult) (pediatric): Secondary | ICD-10-CM | POA: Diagnosis not present

## 2014-12-20 DIAGNOSIS — M159 Polyosteoarthritis, unspecified: Secondary | ICD-10-CM

## 2014-12-20 DIAGNOSIS — J3089 Other allergic rhinitis: Secondary | ICD-10-CM | POA: Diagnosis not present

## 2014-12-20 DIAGNOSIS — K219 Gastro-esophageal reflux disease without esophagitis: Secondary | ICD-10-CM

## 2014-12-20 DIAGNOSIS — E782 Mixed hyperlipidemia: Secondary | ICD-10-CM

## 2014-12-20 DIAGNOSIS — K581 Irritable bowel syndrome with constipation: Secondary | ICD-10-CM

## 2014-12-20 DIAGNOSIS — T451X5A Adverse effect of antineoplastic and immunosuppressive drugs, initial encounter: Secondary | ICD-10-CM

## 2014-12-20 DIAGNOSIS — M5481 Occipital neuralgia: Secondary | ICD-10-CM

## 2014-12-20 DIAGNOSIS — G4486 Cervicogenic headache: Secondary | ICD-10-CM

## 2014-12-20 DIAGNOSIS — Z9989 Dependence on other enabling machines and devices: Secondary | ICD-10-CM

## 2014-12-20 DIAGNOSIS — M8580 Other specified disorders of bone density and structure, unspecified site: Secondary | ICD-10-CM

## 2014-12-20 DIAGNOSIS — M150 Primary generalized (osteo)arthritis: Secondary | ICD-10-CM

## 2014-12-20 DIAGNOSIS — F411 Generalized anxiety disorder: Secondary | ICD-10-CM

## 2014-12-20 DIAGNOSIS — M797 Fibromyalgia: Secondary | ICD-10-CM

## 2014-12-20 DIAGNOSIS — K5730 Diverticulosis of large intestine without perforation or abscess without bleeding: Secondary | ICD-10-CM

## 2014-12-20 DIAGNOSIS — R51 Headache: Secondary | ICD-10-CM

## 2014-12-20 NOTE — Patient Instructions (Signed)
Today we updated your med list in our EPIC system...    Continue your current medications the same...  Stay as active as possible...  Today we gave you the 2016 Flu vaccine...  Remember to increase your Vit D supplement to about 2000u daily...  Call for any questions...  Let's plan a follow up visit in 47mo, sooner if needed for problems.Marland KitchenMarland Kitchen

## 2014-12-20 NOTE — Telephone Encounter (Signed)
Pt notified of results.  She has not received results from Dr. Jana Hakim.  She is agreeable with plan.  She will continue walking.  She is taking 800 IU of Vit D daily along with calcium and will continue both. Patient will call with any further questions.  Closing encounter.

## 2014-12-20 NOTE — Progress Notes (Signed)
Subjective:    Patient ID: [name], female    DOB: 09-14-1935, 79 y.o.   MRN: [id number]  HPI 79 y/o WF here for a follow up visit... she has multiple medical problems including AR & Asthma;  HBP;  CAD followed by [name] & s/p 3 vessel CABG 11/11 by DrGearhardt;  Hyperlipidemia followed in the [facility];  GERD/ IBS/ Divertics;  Breast Cancer diagnosed 12/12;  Fibromyalgia;  Chr HAs & dizziness...  SEE PREV EPIC NOTES FOR OLDER DATA >>    EKG 9/13 showed NSR, rate64, NSSTTWA, NAD...   CXR 10/13 showed heart at upper lim of norm, prior CABG, mild hyperinflation, clear, NAD...  Abd Ultrasound 10/13 by [name] for eval CP showed> norm GB, liver, kidneys, & Ao- neg sonar...  LABS 10-12/13 showed Chems- wnl;  CBC- wnl  LABS 2/14:  FLP- ok x LDL=110;  TSH=1.33   CXR 4/14 showed norm heart size, s/p CABG, tort thor Ao, bilat apical pleural scarring, NAD.[name].  ~  February 03, 2013:  60moROV & PFraser Dinhad Rheum consult DKathlynn Grate last seen 9/14 & note reviewed> neck pain, this exac HAs/migraines, dizzy/ light headed; MRI=> NS consult & not a surg cand & NSAIDs/ PT w/ min benefit; saw Neurology- tried Pamelor but feels hung-over; for her DDD & OA they rec Flex'5mg'$ Tid which has helped some & she requests refill today- OK...     She saw [name] 9/14 after ER visit for HA> hx migraines and mixed HAs; CT Brain was neg & MRI CSpine showed multilevel DDD & some foraminal stenosis; Labs were neg including CRP & Sed; they tried Nortrip & Mobic but only min benefit...    She had routine f/u [name] 10/14> Hx left breast cancer dx 10/12 & treated w/ lumpectomy, XRT, & now Femara; exam neg, f/u mammogram neg, rec f/u 196yr. We reviewed prob list, meds, xrays and labs> see below for updates >> BP remains well controlled on her 3 meds; she had the 2014 flu vaccine 10/14; she continues on allergy shots at the [facility]...  ~  August 11, 2013:  36m24moV & PatFraser Dinports that she is stable overall just lack energy so she's  eating extra protein to help w/ this she tells me... She has had numerous specialty follow up visits in the interval>>    She had an allergy f/u w/ DrVanWinkle 3/15> AR on allergy shots, Allegra, Flonase, Saline; she is stable w/o recent exacerbation...    Hx Asthma on XopenexHFA as needed; she has done well w/o asthma exac, cough, sput, wheezing, or SOB...     She had a Cards f/u w/ drNishan 1/15> HBP, CAD, HL; on ASA81, Metop50Bid, Hyzaar100-12.5, Imdur30; Hx neg Myoview 5/13 & doing satis- no changes made; BP today= 136/68 & she denies CP, palpit, SOB, dizzy, edema, etc...    Lipids controlled on diet alone (w/ FishOil & CoQ10); Last FLP was 2/14 showing TChol 194, TG 143, HDL 55, LDL 110; reminded of diet, exercise, & need for Fasting blood work...    She has seen DrHawkes for rheum and DrHirsh for NS in the past> told to f/u prn only...    She had a Neuro f/u 1/15> HAs followed by [name]; neg CTBrain & DDD on MRI Cspine; she refused to take Pamelor & uses Tramadol/ Flexeril as needed; no change in meds needed...     She saw Oncology 6/15 for f/u of her left Breast Cancer> on Femara2.5 & tol well, s/p lumpectomy & sentinel  node bx 12/12, followed by XRT finished 3/13, & on Letrozole since then, no known recurrence...  We reviewed prob list, meds, xrays and labs> see below for updates >>   ~  December 21, 2013:  105moROV & add-on requested for bronchitic exacerbation> [name], chest congestion, hoarse voice 7 min sore throat ~4d ago; she denied f/c/s but has chest soreness from the coughing 7 bringing up sm amt of yellow-green sput; of note she was recently (3wks ago) treated for a UTI (Proteus sens Cipro) & this resolved... Exam shows borderline BP= 160/78, otherw stable VS w/ O2sat95% on RA, sl pharyngeal erythema, no exudates, ears neg, no adenopathy, hoarse voice, chest exam w/ bilat rhonchi & exp wheezing... CXR shows norm heart size, s/pCABG, clear lungs w/o  infiltrates... We discussed Rx w/ Depo80, Levaquin500 x7d, Mucinex 600-2Bid, Fluids, MMW & Delsym prn...     We reviewed prob list, meds, xrays and labs> see below for updates >>   CXR 10/15 showed normal heart size, s/p CABG, unchanged biapical pleural scarring, otherw clear lungs w/o infiltrates, NAD...   ~  December 30, 2013:  1wk ROV & post hosp check>  [facility]/ HA, HBP, & hyponatremia (Na=119 ?etiology)> she had recently been treated for bronchitic exac w/ Levaquin & Mucinex, she thinks the HBP was a reaction "allergy" to Levaquin & doesn't want to take this antibiotic again; BP was 220 range in ER &treated w/ Cardene drip=> she was actually disch on less meds than PTA=> sdhe returned to ER the day after disch w/ HBP & BP ~200 & she hadn't taken her Losartan yet, given IV hyralazine & BP improved to the 160s; now on Metop50Bid & Cozaar50 (prev on Hyzaar100-12.5); they suspected her diuretic in her hyponatremia which corrected w/ IVF; she has a hx of HAs from DJD/neck and FM; We reviewed the following medical problems during today's office visit >>     AR/ Asthma>  not on any inhaled meds at present; denies cough, sput, ch in dyspnea, etc; she has Allegra, Nasonex, Mucinex, Tessalon, MMW for her various symptoms...    HBP>  on Metop50Bid & Losar50 now w/ BP=146/82 today; denies visual changes, CP, palipit, dizziness, syncope, dyspnea, edema, etc; we decided to incr Losartan to 100 & add Hydralazine50-1/2Bid w/ ROV 2 wks...    CAD>  CP 11/11=> cath w/ in-stent restenosis, then CABG x3 by DrGearhardt; re-hospitalized 6/12 by [facility] for CP> cath showed severe 3 vessel CAD w/ 3/3 grafts patent & norm LVF w/ EF= 65-70%; Myoview 5/13 showed no ischemia & EF>80%; no change in meds- on Imdur30; & DrNishan noted that med rx is lim by side effects.    Hyperlipid>  Intol to statins; [facility] on Cres10- 2d per week reviewed but she was unable to tolerate even this low  dose; off all meds on diet alone now...    GI- GERD, Divertics> on Omep40 + Levsin0.125 & Align; followed by DrPatterson & seen 9/13 f/u for ?diverticulitis- improved after Cipro/Flagyl but CTAbd was neg x diverticulosis; he rec high fiber diet & Citrucel...    Hx left breast cancer> she had f/u Oncology 12/13- on Femara2.5 & tol ok; last mammogram 10/13 was ok as well...    Others>  she has persistant FM symptoms and chr HAs, uses Tramadol & Advil Prn... We reviewed prob list, meds, xrays and labs> see below  for updates >>   CXR 10/15 showed fe chr changes in lungs, norm heart size & s/p CABG, DJD in spine, NAD.Marland KitchenMarland Kitchen  EKG 11/15 showed NSR, rate69, NSSTTWA, NAD...  Lab cumulative summary sheet reviewed...  PLAN>> we decided to incr Losartan to '100mg'$ /d and add Hydralazine50-1/2Bid, continue Metop50Bid and the Imdur30 as before; she knows to aqvoid excess sodium in her diet (esp now that hyponatremia resolved & she's off diuretic; ROV 2wks...    ~  January 14, 2014:  2wk ROV & another hospitalization in the interval> [name] has had a continuing saga of HA & HBP, she has been to the ER, to Cardiology, Hospitalized, back to Cardiology, and almost daily phone calls for HA & HBP w/ BP ~200 sys> all notes reviewed;  Cards eval for secondary cause of the HBP has been neg;  Initially it was assumed that incr BP resulted in HBP but she has a hx of chronic daily HAs and she continues to have HAs when her BP is controlled, and it seems more likely that the pain contributes to her HBP;  She was last Hosp 11/11 - 01/06/14 and disch on ASA81, Imdur60, Hydralazine50Tid, Losar100-1/2Bid, Metop50Bid;  BP at disch was 100/47;  She saw [name] 11/16 on these meds w/ BP recorded 130/64, Imdur was stopped due to her HAs, they plan f/u 56mo  She has called daily since then w/ elev BP (esp at night she says) and we have rec strategies using her current meds to help her headaches and adjust BP meds:    HBP> Rec to adjust meds-  take Metop50Tid, Losartan100-1/2Tid, Apresoline50- take 1/2 to 1tab Tid (she wants to do this since BP is low in AM & she feels weak, with BP elev in PM & she needs more)...    Chronic Daily HAs> she needs f/u appt w/ Neurology w/ their active management of this problem; she has Zanaflex'2mg'$  & Cyclobenzaprine'5mg'$ - asked to try one of these taken Tid & if not improved then try the other Tid; rest, use heating pad on neck, and take Tramadol/ Tylenol up to 3 times daily as needed... We reviewed prob list, meds, xrays and labs> see below for updates >>   CXR 11/15 showed norm heart size, prev CABG, clear lungs- some hyperinflation/ NAD, DJD Tspine...  Renal Sonar 11/15 was wnl...  CT Head 11/15 showed cortical atrophy and small vessel dis, sinuses clear, NAD..Marland KitchenMarland Kitchen  EKG 11/15 showed NSR, rate64, borderline tracing w/?LAE & min NSSTTWA...  2DEcho 11/15 showed norm cavity size & thickness, norm EF= 60-65%, norm wall motion, norm diastolic function, essentially norm valves...   Myoview 11/15 showed low risk scan w/ abn EKG portion but no stress induced perfusion defects, hyperdynamic LVF w/ EF>70% & no regional wall motion abn  LABS 11/15:  Chems- wnl w/ Cr=0.6;  CBC- ok w/ Hg= 12.3;  TSH=1.60 PLAN>>  take Metop50Tid, Losartan100-1/2Tid, Apresoline50- take 1/2 to 1tab Tid (she wants to do this since BP is low in AM & she feels weak, with BP elev in PM & she needs more); needs f/u appt w/ Neurology w/ their active management of this problem; she has Zanaflex'2mg'$  & Cyclobenzaprine'5mg'$ - asked to try one of these taken Tid & if not improved then try the other Tid; rest, use heating pad on neck, and take Tramadol/ Tylenol up to 3 times daily as needed...  ~  February 04, 2014:  3wk ROV & PFraser Dinreports improved overall;  BP is better regulated but still sl volatile &  she like the ability to adjust the Hydralazine> on Metop50Tid, Cozaar100- 1/2 tid, & Hydralazine50- 1/2to1Tid;  BP today= 144/78 and when it was up>160/90  last PM she took an extra '25mg'$  dose...  She saw Neuro [name] for her chronic daily HAs 11/15 and she agreed w/ our Rx for BP control + Muscle relaxer (she prefers the Zanaflex over the Flexeril) + Tramadol/ Tylenol for pain...  She is c/o a jitteriness/ shaking on the inside feeling and we discussed trying a regular medication to try to help elim this sensation> try KLONOPIN 0.'5mg'$ - 1/2 to 1 tab Bid... She will f/u in 10mo& call sooner prn problems...  ADDENDUM>> she never filled the Klonopin and refuses anxiolytic meds.  ~  April 13, 2014:  268moOV & PaFraser Dinas had numerous medical follow up visits over the last 63m47mo  She saw GI 12/15 w/ lower abd pain & nausea similar to prev bouts of diverticulitis in past;  CT Abd&Pelvis showed mild sigmoid diverticulitis, no evid of abscess or complications;  She was treated w/ Cipro & Flagyl & improved;  She is maintained on Prilosec, Anaspaz, fiber & metamucil...     She saw Neuro, [name] 1/16 due to headaches> long hx migraines, recurrent prob since 2014, now 15/30 days per month, CT Brain was neg x atrophy & sm vessel dis, MRI Cspine w/ multilevel DDD etc; treated w/ Tramadol, Flexeril, Zanaflex and [name] has ordered a Sleep Study- still pending & sched at end of this month...     She also saw DrNishan for CARDS f/u 1/16> hx CAD, CABG 2011, HBP, HLD; Adm 10/15 w/ HBP & hyponatremia (Na=119), HCTZ was stopped, BP meds adjusted & now controlled on Metoprolol50Tid, Losar100-1/2Tid, Apres50-1/2Tid; BP today= 136/80, improved & she denies recent CP, palpit, dizzy, ch in SOB, edema;  2DEcho 11/15 showed norm LVF w/ EF=60-65% & no regional wall motion abn, trivial AI, otherw wnl; Lexiscan Myoview 11/15 showed low risk scan w/ abn EKG portion but no ischemic perfusion defects & hyperdynamic LV w/ EF=70% & no wall motion abn... We reviewed prob list, meds, xrays and labs> see below for updates >> we gave her the PREVNAR-13 today... [facility]>> she will continue her same meds and plan f/u in ~44mo66mofasting blood work at that time...   ~  May 03, 2014:  3wk ROV & pt called requesting urgent add-on appt for recurrent BP problems> Pt notes that BP has been well controlled on her regimen of low sodium, Metoprolol50Tid, Hydralazine50-1/2Tid, and Losartan100-1/2Tid> until 3d ago when she had the onset of another severe HA- assoc w/ the severe pain she noted that her BP was once again elevated (190-230/ 100-110 she says) & the usual doses of the 3 meds didn't bring it down; she tried taking the Tramadol50 as directed by [name] & added Tylenol plus her musc relaxers Flexeril & Zanaflex but the HA persisted; when she called [name]'s office she was directed to call us [place] an appt because of the BP... NOTE> it has been determined by her Cardiologist DrNishan & I concur that in her case it's the HA PAIN that causes her BP to spike, and relief of the pain is paramount to controlling the BP in these instances...  In this regard she is directed to maintain a baseline BP regimen w/ Metoprolol50Tid, Apres50-1/2Tid & Losar100-1/2Tid;  But when she has a HA she will immediately start her Neurology action plan for  her severe headaches, and check her BP Tid w/ adjustment of her meds up to one of each tab Tid until the HA is resolved, then return to her baseline regimen... We reviewed this plan in detail & she will let us know if she has any questions...   CXR 3/16 showed norm heart size, s/p CABG, clear lungs, NAD.Marland KitchenMarland Kitchen   EKG 3/16 showed SBrady, rate53, NSSTTWA, NAD... PLAN>> continue baseline BP med regimen w/ METOPROLOL50Tid, APRESOLINE50-1/2Tid & LOSARTAN100-1/2Tid; continue to monitor BP at home; during episodes of severe HA pain- follow Neurologists HA action plan for control of the discomfort; in addition start to monitor BP 3 times daily & increase BP meds to 1 of each Tid until the HA is controlled & the BP returns to normal; then resume the  baseline regimen...  ~  August 16, 2014:  3-76mo ROV & [name] has continued w/ her regular array of medical specialist follow up visits> SEE BELOW    Asthma & allergies controlled on Allegra & Nasonex, no asthma attacks etc...    BP is doing better on current Metop50Tid, Losar100-1/2Tid, Apres50-1/2Tid w/o any elev readings at home...     She has had several follow up visits w/ GI> hx GERD, Divertics, IBS, Polyps> last bout of diverticulitis was 12/15 treated w/ Cipro & Flagyl; she recurrent pain 3/16- seen in ER where CT was neg but she improved w/ Antibiotic rx; she continues on Omep & they added Ranitadine; she's been on Levsin & they added benefiber; she had Ba swallow (neg), EGD (wnl) & Colon (divertics and 2 sessile polyps) by DrStark...     She saw her oncologist, DrMagrinat 6/16 for f/u of her breast cancer> Dx 10/12 & treated w/ lumpectomy & sentinel node bx, followed by XRT, the Femara for a planned 58yrs (2 more yrs to go); they follow her BMDs etc...     She has a small cyst on her right index finger & I have suggested an Ortho/Hand eval, "I might want to see Rheumatology again" she says...    She's had an extensive Neuro eval from [name] & had a Neurology 2nd opinion consult 5/16 w/ DrDKirby at [facility] in [place]-  "it's my neck" and she states nothing new was discovered "they didn't find anything"; she has Tramadol & Tylenol for pain... We reviewed prob list, meds, xrays and labs> see below for updates >>   Barium Swallow was neg- norm motility, no HH, etc...  EGD 5/16 by DrStark showed essentially normal endoscopy; rec to continue antireflux regimen & PPI Rx...  Colonoscopy 5/16 by DrStark showed diverticulosis, 2 sessile polyps (one tub adenoma, the other hyperplastic)...  LABS 6/16: Chems- ok x BS=147;  CBC- wnl;   PLAN>>  She is rec to continue her current med regimen and follow up w/ her specialists...  ~  December 20, 2014:  62mo ROV & [name] indicates that she is the same,  maintaining her regular f/u visits w/ various medical specialists>    Asthma & allergies controlled on Allegra & Nasonex, no asthma attacks etc & breathing is OK, she's been walking for exercise...    BP is doing better on current Metop50Tid, Losar100-1/2Tid, Apres50-1/2Tid w/o any elev readings at home; she saw DrNishan for Cards 8/16> HBP, CAD, CABG 2011, HL; nonischemic myoview2015, BP controlled, intol to statins.     She has had several follow up visits w/ GI> hx GERD, Divertics, IBS, Polyps> on Omep40, Zantac300, Anaspaz0.125 prn; last bout of diverticulitis was 12/15 treated  w/ Cipro & Flagyl; she had recurrent pain 3/16- seen in ER where CT was neg but she improved w/ Antibiotic rx; she had Ba swallow (neg), EGD (wnl) & Colon (divertics and 2 sessile polyps) by DrStark...     She saw her oncologist, DrMagrinat 6/16 for f/u of her breast cancer> Dx 10/12 & treated w/ lumpectomy & sentinel node bx, followed by XRT, the Femara for a planned 4yrs (2 more yrs to go); they follow her BMDs etc...     She had routine GYN check by PatGrubb 7/16> note reviewed...    She has a small cyst on her right index finger & I have suggested an Ortho/Hand eval, "I might want to see Rheumatology again" she says...    She's had an extensive Neuro eval from [name] & had a Neurology 2nd opinion consult 5/16 w/ DrDKirby at [facility] in [place]-  "it's my neck" and she states nothing new was discovered "they didn't find anything"; she has Tramadol & Tylenol for pain=> f/u [name] 8/16 & offered occipital nerve blocks; she had a 3rd opinion from [place] 9/16> note reviewed. Chr daily HAs (occip neuralgia vs cervicogenic HAs), rec occip nerve blocks & PT, Gabapentin and f/u planned.    She also saw DrAthar for a Sleep Eval split night study 04/25/14 w/ AHI=16/hr w/ desat to 85% reported; she was titrated to CPAP 6cm H2O w/ AHI=0 and O2sat nadir=96%, snoring was eliminated; download from 8/16 looked good but she reported some  mask issues which they are working on; she notes feeling sl better on the days that she wears the CPAP but HAs are about the same... We reviewed prob list, meds, xrays and labs> see below for updates >> given 2016 FLU vaccine today...  Vit D level 7/16 reported at 29 & she is rec to increase Vit D supplement to ~2000u daily...            Problem List:  GLAUCOMA (ICD-365.9) - she is sched for right cataract surg by DrBevis in Feb...  ALLERGIC RHINITIS (ICD-477.9) - she uses Allegra & Nasonex Prn; on Allergy shots weekly x yrs... ~  She is followed by DrVanWinkle at the [facility]=> she remains on allergy shots...  ASTHMA & Asthmatic Bronchitis >> off Symbicort & using XOPENEX HFA as needed rescue inhaler... ~  5/11:  notes incr chest symptoms in the heat & rec to take the Symbicort 2sp Bid... ~  2012:  After her CABG & repeat hosp by Cards she is noted to be off her inhalers... ~  10/12:  Treated w/ Zpak, Pred, Mucinex, plus her Symbicort80 & improved... ~  CXR 5/13 showed s/p CABG, clear lungs, NAD (she went to the Er w/ cough & had neg eval). ~  7/13:  DrVanWinkle stopped her Symbicort & switched to [facility] HFA as needed... ~  2/14: she does not list any inhaled meds at present & states breathing OK w/o exac... ~  4/14: she was hosp after pill asp w/ asthma exac & improved w/ supportive Rx, didn't require bronch, CXR was clear, grad improved post hosp... ~  CXR 4/14 showed normal heart size, s/p CABG, sl tort Ao, bilat apic pleural scarring, NAD...  ~  6/15: she is stable w/o asthma exac and has avoided URIs/ bronchitis/ etc... ~  10/15: presented w/ URI/ bronchitic exac w/ cough, yellow-green sput, congestion, hoarseness, etc; CXR w/o signs of pneumonia & treated w/ Depo, Levaquin, Mucinex, MMW, Delsym, etc...  ~  CXR 10/15 showed few chr changes in lungs, norm heart size & s/p CABG, DJD in spine, NAD  HYPERTENSION (ICD-401.9) - controlled on TOPROL  50mg Bid & HYZAAR 100-12.5 daily (off Lisinopril due to ACE cough)...  ~  8/12: BP=  128/84 & tol meds well; denies HA, fatigue, visual changes, CP, palipit, syncope, dyspnea, edema, etc... ~  2/13: BP= 140/64 & she remains essentially asymptomatic on Rx... ~  8/13:  BP= 132/64 & she denies CP, palpit, SOB, edema, etc... ~  2/14: controlled on Toprol & Hyzaar w/ BP 144/72 today; denies visual changes, CP, palipit, dizziness, syncope, dyspnea, edema, etc... ~  5/14:  BP= 136/60 & she remains stable... ~  8/14:  BP= 134/60 and she denies CP, palpit, dizzy, SOB, edema... ~  12/14: on Metop50Bid, Hyzaar 100-12.5, Imdur30;  BP= 140/80 & she remains largely asymptomatic... ~  6/15: on ASA81, Metop50Bid, Hyzaar100-12.5, Imdur30; BP today= 136/68 & she denies CP, palpit, SOB, dizzy, edema, etc... ~  10/15: on same meds w/ BP= 160/78 & no changes meds- advised to monitor BP at home once she's over the URI illness, take meds regualrly... ~  11/15: post Hosp for HBP, hyponatremia; they stopped diuretic & disch on Metop50Bid & Losar50; she went back to ER the next day w/ BP~200 (hadn't taken her Losartan) & given IV Hydralazine w/ improvement to 160s; we decided to continue the BBlocker, increase the ARB to 100mg /d, and ADD HYDRALAZINE50- 1/2Bid for now; ROV 2 weeks... ~  11/15: she is s/p ER, Hosp, Cards visits & mult phone calls (see above)> meds ajusted- take Metop50Tid, Losartan100-1/2Tid, Apresoline50- 1/2 to 1tab Tid (she wants to do this since BP is low in AM & she feels weak, with BP elev in PM & she needs more)... ~  2/16: BP meds adjusted over the last few months & BP controlled on Metoprolol50Tid, Losar100-1/2Tid, Apres50-1/2Tid; BP today= 136/80, improved & she denies recent CP, palpit, dizzy, ch in SOB, edema... ~  3/16: pt presented w/ BP elev during recurrent bouts of HA pain- asked to increase her above meds to Metop50/ Losar100/ Apres50- at her Tid dosing intervals when needed for HBP while her HA  meds are kicking in to resolve her pain; if her pain doesn't resolve & her BP does not improve, then she knows to go to the ER for immediate assessment...   CORONARY ARTERY DISEASE (ICD-414.00) - on ASA 81mg /d (intol to 325 she says) & off Plavix per [name]...  Cardiolite 9/08 showed CP & HBP response, EF+85%... had cath 10/08 w/ single vessel dis w/ mod ostial LAD stenosis and patent LAD stent, normal LVF.[name]. option for med Rx vs off pump LIMA... she notes some CP/ pressure "burning" when walking up a certain hill during her daily walks, but states this is stable and "no worse"... ~  Myoview 9/09 was neg- no scar or ischemia, EF= 89%, hypertensive response- Lisinopril10 added. ~  f/u Myoview 3/11 was neg- no scar or ischemia, EF= 86%, norm BP response, fair exerc capacity. ~  saw [name] 5/11- no CP, he stopped her Plavix, stable- continue other meds same. ~  She had recurrent CP 11/11 w/ in-stent restenosis on cath> subseq 3 vessel CABG by DrGearhart & doing well since then... ~  6/12:  Hosp w/ CP & repeat cath showed CAD & 3/3 grafts patent, EF= 65-70% ~  Myoview 5/13 was felt to be a normal stress nuclear study- normal wall motion, no ischemia, EF=84% ~  2/14: CP 11/11=> cath  w/ in-stent restenosis, then CABG x3 by DrGearhardt; re-hospitalized 6/12 by [facility] for CP> cath showed severe 3 vessel CAD w/ 3/3 grafts patent & norm LVF w/ EF= 65-70%; Myoview 5/13 showed no ischemia & EF>80%; no change in meds; & [name] noted that med rx is limited by side effects... ~  8/14 & 12/14: she remains stable on Imdur30 + Metop50Bid 7 Hyzaar100-12.5; denies CP, palpit, SOB, etc... ~  6/15: Cards f/u w/ DrNishan 1/15> HBP, CAD, HL; on ASA81, Metop50Bid, Hyzaar100-12.5, Imdur30; Hx neg Myoview 5/13 & doing satis- no changes made; BP today= 136/68 & she denies CP, palpit, SOB, dizzy, edema, etc ~  EKG 11/15 showed NSR, rate69, NSSTTWA, NAD  ~  2DEcho 11/15 showed  norm LVF w/ EF=60-65% & no regional wall motion  abn, trivial AI, otherw wnl. ~  Myoview 11/15 showed low risk scan w/ abn EKG portion but no stress induced perfusion defects, hyperdynamic LVF w/ EF>70% & no regional wall motion abn. ~  11/15: she is off the Imdur per DrNishan due to HAs... ~  1/16: she had f/u DrNishan> stable on meds and no changes made...  HYPERLIPIDEMIA (ICD-272.4) - on CRESTOR 10mg - 1/2 on M&F, FISH OIL 1200mg  Tid + Flax Seed Oil... she has been intol to statins in the past>  tried low dose Trilipix but stopped this due to "thigh burning">  now followed in the Lipid Clinic--- ~  [place] 7/08 showed TChol 217, TG 236, HDL 46, LDL 150...  ~  [place] 6/09 showed TChol 233, TG 263, HDL 46, LDL 150... she agrees to try LOW DOSE Trilipix. ~  FL[place] 11/09 showed TChol 162, TG 167, HDL 45, LDL 83... ~  [place] 11/10 showed TChol 240, TG 271, HDL 45, LDL 150... [facility] Rx w/ FishOil & FlaxSeedOil. ~  FL[place] 2/11 showed TChol 285, TG 156, HDL 79, LDL 180... LC is aware & working w/ her regularly. ~  [place] 5/11 showed TChol 277, TG 241, HDL55, LDL 193...  LC added Cres10 just 2d per week. ~  FLP 10/11 showed TChol 198, TG 215, HDL 53, LDL 109... much improved on Cres10 twice weekly. ~  She continues to f/u w/ Lipid clinic & they titrated up the Cres10 on M&F, plus 5mg  on Wednes> ~  FLP  10/12 was the best yet w/ TChol 167, TG 203, HDL 54, LDL 88...  ~  [place] 4/13 showed TChol 193, TG 176, HDL 57, LDL 101 ~  She is still followed in the [facility]- note 6/13 reviewed (at that time on Cres10 on M&F, 5mg  on W) but she subseq cut herself down to 1/2 on M&F due to nausea & aching. ~  FL[place] 2/14 on diet alone showed TChol 194, TG 143, HDL 55, LDL 110... We reviewed diet, exercise, etc... ~  She remains off the [place] on diet alone and she declines to restart low dose rx "I can't tolerate them"  GERD (ICD-530.81) - on OMEPRAZOLE 40mg /d... last EGD by DrPatterson 8/07 revealed sm HH, & gastritis and dilatation done... (RUT=neg, PPI Rx)... LEVSIN 0.125mg  helps her esoph  spasm... Prn Phenergan for nausea.  DIVERTICULOSIS, COLON (ICD-562.10) & IRRITABLE BOWEL SYNDROME (ICD-564.1) ~  colonoscopy 2/03 by DrPatterson showed divertics and 5 mm polyp... ~  colonoscopy 3/11 showed divertics & cecal polyp= tubular adenoma, w/ f/u planned 50yrs. ~  9/13: seen by DrPatterson f/u for ?diverticulitis- improved after Cipro/Flagyl but CTAbd was neg x diverticulosis; he rec high fiber diet & Citrucel...  ? GALLSTONES>>  Diagnosed  by [facility] and referred to [facility] DrWilson, pt asked to call prn fopr incr symptoms to consider surg... ~  Abd Ultrasound 10/13 by [name] for eval CP showed> norm GB, liver, kidneys, & Ao- neg sonar.  BREAST CANCER >> Abn mammogram 10/12 showing a left breast nodule & subseq surg (partial mastectomy & sentinel node bx) by [name] 12/12 that proved to be an invasive ductal carcinoma, 2.3cm size, w/ neg lymphovasc invasion, clear margins (but close at 35mm), & neg nodes; she had XRT by DrKindard (had her 11th treatment today); and Oncology eval by DrMagrinat who plans hormone Rx later (no chemo she says)...  ~  2/13: She is tolerating treatment well & spirits are up, [name] had to aspirate a seroma & it is now resolved... ~  She is followed by DrMagrinat, Streck, Kinard- now on [facility] 2.5mg /d w/ 23yrs planned... ~  6/14:  She had f/u DrMagrinat 6/14> his note is reviewed- s/p lumpectomy, sentinel node, XRT; plan is to continue Femara2.5mg  thru 3/16 for 63yrs rx. ~  10/14:  She had routine f/u [name] 10/14> Hx left breast cancer dx 10/12 & treated w/ lumpectomy, XRT, & now Femara; exam neg, f/u mammogram neg, rec f/u 83yr. ~  6/15:  She saw Oncology for f/u of her left Breast Cancer> on Femara2.5 & tol well, s/p lumpectomy & sentinel node bx 12/12, followed by XRT finished 3/13, & on Letrozole since then, no known recurrence...   DJD, Neck Pain >> ~  XRays Cspine 4/14 in hosp showed multilevel cerv spondylosis, 1-21mm anterolisthesis C4 on C5, DDD,  foraminal stenosis at C5-6...  ~  5/14:  MRI Cspine => multilevel facet hypertrophy, foraminal narrowing & stenosis, disc osteophyte complexes, some central canal stenosis=> she was sent to NS, DrHirsh who rec Mobic & PT (helped alittle) but she was not pleased... ~  8/14:  She is requesting a Rheumatology 2nd opinion about her neck discomfort=> seen by [facility]- neck pain, this exac HAs/migraines, dizzy/ light headed; MRI=> NS consult & not a surg cand & NSAIDs/ PT w/ min benefit; saw Neurology- tried Pamelor but feels hung-over; for her DDD & OA they rec Flex5mg Tid which has helped some...  FIBROMYALGIA (ICD-729.1) - she c/o chr fatigue, aching/ sore, on Tramadol & Tylenol, but most benefit from low dose Hydrocodone ~1/2 tab Prn... I have recommended an increase exercise program to her... ~  5/11:  Vit D level = 50 on 50000 u weekly by Helene Shoe, NP  HEADACHE (ICD-784.0) - eval at [facility] clinic in 2002, but she notes Rx didn't help... ~  3/11:  presented w/ muscle contraction HA's> Rx Tramadol 50mg  Q6H Prn. ~  She has persistent/ recurrent HAs... ~  11/15: her chronic daily HAs have again risen in improtance due to interaction w/ her HBP> asked to f/u w/ Neuro ASAP & Rx w/ rest, heating pad, Tramadol50/ Tylenol, and musc relaxer- Zanaflex2Tid vs Flexeril5Tid...  DIZZINESS, CHRONIC (ICD-780.4) - MRI 2/09 per [name] showed atrophy, sm vessel dis, NAD...  Health Maintenance: ~  GI:  DrPatterson & up to date on colon screening. ~  GYN:  yearly f/u w/ Helene Shoe, NP for PAP, Mammogram at [facility], ?last BMD. ~  Immunizations:  yearly Flu vaccines in fall, Pneumovax in 2007?, TDAP given 5/11...   Past Surgical History  Procedure Laterality Date  . Coronary angioplasty with stent placement      Stent 2007  . Open heart surgery      01/19/2010  . Coronary artery bypass  graft    . Cataract extraction, bilateral      bilateral caaract removal,  . Breast lumpectomy Left 02/06/11     Outpatient Encounter Prescriptions as of 12/20/2014  Medication Sig  . aspirin EC 81 MG tablet Take 81 mg by mouth daily.  . cyclobenzaprine (FLEXERIL) 5 MG tablet take 1/2 to 1 tablet by mouth twice a day if needed  . fexofenadine (ALLEGRA) 180 MG tablet Take 180 mg by mouth daily.    . fluocinonide (LIDEX) 0.05 % external solution Apply 1 application topically daily as needed (scalp itching).   . Glucosamine-Chondroit-Vit C-Mn (GLUCOSAMINE CHONDR 1500 COMPLX PO) Take 1 tablet by mouth 3 (three) times daily.    . hydrALAZINE (APRESOLINE) 50 MG tablet Take 1/2 tablet by mouth three times daily  . hyoscyamine (ANASPAZ) 0.125 MG TBDP disintergrating tablet Place 0.125 mg under the tongue every 6 (six) hours as needed for bladder spasms or cramping.  [name] ketoconazole (NIZORAL) 2 % shampoo Apply 1 application topically 2 (two) times a week. As needed for itching a couple of times week  . latanoprost (XALATAN) 0.005 % ophthalmic solution Place 1 drop into both eyes at bedtime.   [name] letrozole (FEMARA) 2.5 MG tablet Take 1 tablet (2.5 mg total) by mouth daily.  [name] losartan (COZAAR) 100 MG tablet Take 1/2 tablet by mouth three times daily  . metoprolol (LOPRESSOR) 50 MG tablet Take 1 tablet (50 mg total) by mouth 3 (three) times daily.  . mometasone (NASONEX) 50 MCG/ACT nasal spray Place 2 sprays into the nose daily as needed (congestion).   . nitroGLYCERIN (NITROSTAT) 0.4 MG SL tablet Place 1 tablet (0.4 mg total) under the tongue every 5 (five) minutes as needed for chest pain.  . Omega-3 Fatty Acids (FISH OIL) 1200 MG CAPS Take 1,200 mg by mouth daily.   [name] omeprazole (PRILOSEC) 40 MG capsule Take 1 capsule (40 mg total) by mouth daily. 30 minutes before 1st meal of the day  . tiZANidine (ZANAFLEX) 2 MG tablet Take 2 mg by mouth at bedtime.  . traMADol (ULTRAM) 50 MG tablet Take 1 tablet (50 mg total) by mouth 3 (three) times daily as needed.  . vitamin B-12 (CYANOCOBALAMIN) 1000 MCG tablet Take  1,000 mcg by mouth daily.      Allergies  Allergen Reactions  . Choline Fenofibrate Other (See Comments)     pt states INTOL to Trilipix w/ "thigh burning"  . Simvastatin Other (See Comments)     pt states INTOL to STATINS \\T \ refuses to restart  . Hctz [Hydrochlorothiazide]     Causes hyponatremia  . Hydrocodone     Nightmare after taking cough syrup w/hydrocodone  . Levaquin [Levofloxacin In D5w]     Elevated BP  . Adhesive [Tape] Rash  . Ceclor [Cefaclor] Rash  . Clarithromycin Rash  . Codeine Nausea Only  . Doxycycline Rash  . Lisinopril Cough    Developed ACE cough...  . Penicillins Itching and Rash    At injection site  . Tobramycin-Dexamethasone Rash    Current Medications, Allergies, Past Medical History, Past Surgical History, Family History, and Social History were reviewed in [employer] record.    Review of Systems    See HPI - all other systems neg except as noted...  The patient complains of headaches.  The patient denies anorexia, fever, weight loss, weight gain, vision loss, decreased hearing, hoarseness, chest pain, syncope, dyspnea on exertion, peripheral edema, prolonged cough, hemoptysis, abdominal pain, melena, hematochezia,  severe indigestion/heartburn, hematuria, incontinence, muscle weakness, suspicious skin lesions, transient blindness, difficulty walking, depression, unusual weight change, abnormal bleeding, enlarged lymph nodes, and angioedema.   Objective:   Physical Exam    WD, WN, 79 y/o WF in NAD... GENERAL:  Alert & oriented; pleasant & cooperative... HEENT:  [place]/AT, EOM-wnl, PERRLA, EACs-clear, TMs-wnl, NOSE-clear, THROAT- clear & wnl. NECK:  Decr ROM; no JVD; normal carotid impulses w/o bruits; no thyromegaly or nodules palpated; no lymphadenopathy. CHEST:  Clear- no wheezing, rales, or signs of consolidation... HEART:  Regular Rhythm; without murmurs/ rubs/ or gallops detected... ABDOMEN:  Soft & nontender; normal  bowel sounds; no organomegaly or masses palpated... EXT: without deformities, mild arthritic changes and +trigger points, no varicose veins/ venous insuffic/ or edema. NEURO:  CN's intact; motor testing normal; sensory testing normal; gait normal & balance OK. DERM:  No lesions noted; no rash etc...  RADIOLOGY DATA:  Reviewed in the EPIC EMR & discussed w/ the patient...  LABORATORY DATA:  Reviewed in the EPIC EMR & discussed w/ the patient...   Assessment & Plan:    Asthma/ AR>  Stable on allergy shots & XOPENEX as needed; no recent exac... Hx pill asp w/ asthma exac in past => resolved...  HBP>  Controlled on BP med regimen of Metoprolol50Tid, Losartan100-1/2Tid, Hydralazine50-1/2Tid... BP is noted to be up when she has pain, esp HA pain; Headaches are managed by Luvenia Redden on Tramadol, Flexeril, Zanaflex... 3/16> she presented w/ severe HA and assoc elev BP readings- we rec increasing her 3 meds to 1 of each Tid to manage the BP until the pain abates... 10/16> BP has been well controlled on her 3 med regimen...  CAD>  Cath w/ patent grafts; had neg Nuclear study; continue same meds and f/u DrNishan...  CHOL>  Followed in the Lipid Clinic & intol to all meds- FLP looks better however on diet alone...  GI> GERD, Divertics, ?Gallstones>  Followed by [facility] & stones eval by DrWilson [facility]; odd that f/u sonar 9/13 did not show stones...  BREAST CANCER>  on FEMARA > treated by [name], DrMagrinat, DrKinard & records reviewed...  FM/ HAs/ etc>  On Ultram, Tylenol, etc => needs active management of this prob by Neuro w/ an "action plan" for severe HA episodes... NECK PAIN> with multilevel spondylosis & eval by NS, DrHirsh but she was not pleased & is asking for Rheum eval...  Other medical issues as noted> on CoQ10, Glucosamine, Vit B12, Vit D, etc...   Patient's Medications  New Prescriptions   No medications on file  Previous Medications   ACETAMINOPHEN (TYLENOL 8 HOUR PO)     Take by mouth as needed.   ASPIRIN EC 81 MG TABLET    Take 81 mg by mouth daily.   CLOBETASOL (TEMOVATE) 0.05 % EXTERNAL SOLUTION    APPLY TO THE AFFECTED SCALP AREA IN THE MORNING AND EVENING   FEXOFENADINE (ALLEGRA) 180 MG TABLET    Take 180 mg by mouth daily.     FLUOCINONIDE (LIDEX) 0.05 % EXTERNAL SOLUTION    Apply 1 application topically daily as needed (scalp itching).    FLUTICASONE (FLONASE) 50 MCG/ACT NASAL SPRAY    Takes as needed   GLUCOSAMINE-CHONDROIT-VIT C-MN (GLUCOSAMINE CHONDR 1500 COMPLX PO)    Take 1 tablet by mouth 3 (three) times daily.     HYDRALAZINE (APRESOLINE) 50 MG TABLET    Take 1/2 tablet by mouth three times daily   HYOSCYAMINE (ANASPAZ) 0.125 MG TBDP DISINTERGRATING TABLET  Place 0.125 mg under the tongue every 6 (six) hours as needed for bladder spasms or cramping.   KETOCONAZOLE (NIZORAL) 2 % SHAMPOO    Apply 1 application topically 2 (two) times a week. As needed for itching a couple of times week   LATANOPROST (XALATAN) 0.005 % OPHTHALMIC SOLUTION    Place 1 drop into both eyes at bedtime.    LETROZOLE (FEMARA) 2.5 MG TABLET    Take 1 tablet (2.5 mg total) by mouth daily.   LOSARTAN (COZAAR) 100 MG TABLET    take 1/2 tablet by mouth three times a day   MAGNESIUM OXIDE (MAG-OX) 400 MG TABLET    Take 400 mg by mouth daily.   METOPROLOL (LOPRESSOR) 50 MG TABLET    Take 1 tablet (50 mg total) by mouth 3 (three) times daily.   MOMETASONE (NASONEX) 50 MCG/ACT NASAL SPRAY    Place 2 sprays into the nose daily as needed (congestion).    NITROGLYCERIN (NITROSTAT) 0.4 MG SL TABLET    Place 1 tablet (0.4 mg total) under the tongue every 5 (five) minutes as needed for chest pain.   OMEGA-3 FATTY ACIDS (FISH OIL) 1200 MG CAPS    Take 1,200 mg by mouth daily.    OMEPRAZOLE (PRILOSEC) 40 MG CAPSULE    take 1 capsule by mouth once daily 30 MINUTES BEFORE 1ST MEAL OF THE DAY   PRESCRIPTION MEDICATION    Vitamin B 2   RANITIDINE (ZANTAC) 300 MG CAPSULE    Take 1 capsule (300 mg  total) by mouth every evening.   TRAMADOL (ULTRAM) 50 MG TABLET    take 1 tablet by mouth three times a day if needed   VITAMIN B-12 (CYANOCOBALAMIN) 1000 MCG TABLET    Take 1,000 mcg by mouth daily.    Modified Medications   No medications on file  Discontinued Medications   CIPROFLOXACIN (CIPRO) 250 MG TABLET    Take 1 tablet (250 mg total) by mouth 2 (two) times daily.

## 2015-01-03 DIAGNOSIS — M542 Cervicalgia: Secondary | ICD-10-CM | POA: Diagnosis not present

## 2015-01-03 DIAGNOSIS — M9901 Segmental and somatic dysfunction of cervical region: Secondary | ICD-10-CM | POA: Diagnosis not present

## 2015-01-03 DIAGNOSIS — R51 Headache: Secondary | ICD-10-CM | POA: Diagnosis not present

## 2015-01-03 DIAGNOSIS — S13140A Subluxation of C3/C4 cervical vertebrae, initial encounter: Secondary | ICD-10-CM | POA: Diagnosis not present

## 2015-01-04 ENCOUNTER — Telehealth: Payer: Self-pay | Admitting: Neurology

## 2015-01-04 NOTE — Telephone Encounter (Signed)
Ok to start low dose Gabapentin 100mg  qhs x 1 week, then increase to 2 caps qhs. #60 with 3 refills. Pls have her monitor for daytime drowsiness, if too drowsy, take at dinner instead of bedtime. Thanks.

## 2015-01-04 NOTE — Telephone Encounter (Signed)
Pt needs to talk to some one about her medication and physical therapy please call [phone]

## 2015-01-04 NOTE — Telephone Encounter (Signed)
Left msg with patient's husband to return my call. 

## 2015-01-04 NOTE — Telephone Encounter (Signed)
I spoke with patient. She states she doesn't want to go to physical therapy, but she is willing to try a low dose of gabapentin.

## 2015-01-05 MED ORDER — GABAPENTIN 100 MG PO CAPS: ORAL_CAPSULE | ORAL | Status: DC

## 2015-01-05 NOTE — Telephone Encounter (Signed)
Patient returned my call. She is agreeable to starting Gabapentin 100 mg. I did explain to her how she is supposed to take it. New Rx sent to her pharmacy.

## 2015-01-05 NOTE — Telephone Encounter (Signed)
Lmovm to rtn my call. 

## 2015-01-10 ENCOUNTER — Telehealth: Payer: Self-pay | Admitting: Neurology

## 2015-01-10 NOTE — Telephone Encounter (Signed)
Patient states she is concerned about possible side effects of Gabapentin, causing increased depression, thoughts of suicide, and balance issues. She states she hasn't started med yet. Concerned about the bp med she is already taking with the possible balance/dizzy issue.

## 2015-01-10 NOTE — Telephone Encounter (Signed)
I spoke with patient and notified her of advisement. She states that she is going to give medication a try. I advised her to call back if she had any issues or any more questions about the medication.

## 2015-01-10 NOTE — Telephone Encounter (Signed)
Pls let her know that this is certainly up to her. The 100mg  dose is a very low dose, and she can stop it anytime if she starts feeling any side effects. Thanks

## 2015-01-10 NOTE — Telephone Encounter (Signed)
PT called in regards to her medication Gabapentin/Dawn CB# [phone]

## 2015-01-11 ENCOUNTER — Ambulatory Visit: Payer: Medicare Other | Admitting: Nurse Practitioner

## 2015-01-13 DIAGNOSIS — J301 Allergic rhinitis due to pollen: Secondary | ICD-10-CM | POA: Diagnosis not present

## 2015-01-13 DIAGNOSIS — J3089 Other allergic rhinitis: Secondary | ICD-10-CM | POA: Diagnosis not present

## 2015-01-17 DIAGNOSIS — M9903 Segmental and somatic dysfunction of lumbar region: Secondary | ICD-10-CM | POA: Diagnosis not present

## 2015-01-17 DIAGNOSIS — M9901 Segmental and somatic dysfunction of cervical region: Secondary | ICD-10-CM | POA: Diagnosis not present

## 2015-01-17 DIAGNOSIS — M542 Cervicalgia: Secondary | ICD-10-CM | POA: Diagnosis not present

## 2015-01-17 DIAGNOSIS — M797 Fibromyalgia: Secondary | ICD-10-CM | POA: Diagnosis not present

## 2015-01-23 ENCOUNTER — Telehealth: Payer: Self-pay | Admitting: Cardiovascular Disease

## 2015-01-23 ENCOUNTER — Telehealth: Payer: Self-pay | Admitting: Neurology

## 2015-01-23 MED ORDER — AMLODIPINE BESYLATE 5 MG PO TABS: 5.0000 mg | ORAL_TABLET | Freq: Every day | ORAL | Status: DC

## 2015-01-23 NOTE — Telephone Encounter (Signed)
I called and spoke with the patient. She reports that her BP has been running high, some prior to Thanksgiving day, but more so over the weekend. Thursday her BP was 165/74 (am), then 128/46, then 112/61 (pm). Last night she started having a headache and some slight chest pressure. She took tramadol and 2 NTG- with some relief of her headache and chest pressure. Her BP last night was 208/115 (11 pm), then she took two extra hydralazine (total 100 mg extra). BP was 175/92 (12 am), 199/97 (2 am), 177/85 (after she got up this morning), 147/78 (10:30 am) HR- 61. The patient reports she did have some extra sodium in her diet over the Thanksgiving holiday. She also started on gabapentin 100 mg once daily ~ 2 weeks ago. I advised the patient I would review with my provider in the office and call her back. She is agreeable.

## 2015-01-23 NOTE — Telephone Encounter (Signed)
I returned call to patient. She states that she has been having headaches due to her bp being elevated. She states that it was going as high as 200/109. I did advise her that she needed to call her pcp for this. She states she has called her cardiologist office and left a msg for the doctor and she hasn't heard anything yet. She state she gets a bp med from her cardiologist and one from her pcp. I did advise if she hadn't heard anything from them soon that she should also call her pcp's office to make them aware also. She verbalized good understanding.

## 2015-01-23 NOTE — Telephone Encounter (Signed)
Reviewed the patient's BP readings with Truitt Merle, NP. Orders received to have the patient start amlodipine 5 mg once daily at bedtime. I have reviewed this with the patient. She is agreeable. She will continue to monitor her BP readings and let us know if this is not working for her. I will forward to Dr. Johnsie Cancel for an FYI/ any further recommendations.

## 2015-01-23 NOTE — Telephone Encounter (Signed)
New message      Pt c/o BP issue: STAT if pt c/o blurred vision, one-sided weakness or slurred speech  1. What are your last 5 BP readings?  177/85 this am, 199/97 at 2am, 208/115 11pm yesterday 2. Are you having any other symptoms (ex. Dizziness, headache, blurred vision, passed out)? Headache and tightness in back of head 3. What is your BP issue? bp is high

## 2015-01-23 NOTE — Telephone Encounter (Signed)
Pt called to inform that she is having severe headaches/ is there anything she can have for that?//return call@ [phone]

## 2015-01-24 DIAGNOSIS — M50322 Other cervical disc degeneration at C5-C6 level: Secondary | ICD-10-CM | POA: Diagnosis not present

## 2015-01-24 DIAGNOSIS — M9901 Segmental and somatic dysfunction of cervical region: Secondary | ICD-10-CM | POA: Diagnosis not present

## 2015-01-24 DIAGNOSIS — M5412 Radiculopathy, cervical region: Secondary | ICD-10-CM | POA: Diagnosis not present

## 2015-01-25 ENCOUNTER — Telehealth: Payer: Self-pay | Admitting: Neurology

## 2015-01-25 NOTE — Telephone Encounter (Signed)
Pt called to inform that she is having trouble with her BP/ and Headaches/ call back @ [phone]

## 2015-01-25 NOTE — Telephone Encounter (Signed)
Agree with following Cardiology recommendations. Once she is more stable on amlodipine, we can again discuss medication for headache (gabapentin), but would not start another medication at this time since she only started amlodipine recently. Thanks

## 2015-01-25 NOTE — Telephone Encounter (Signed)
Called patient and notified of advisement. She will discuss further at her f/u visit on 12/20.

## 2015-01-25 NOTE — Telephone Encounter (Signed)
I spoke with patient. She states she thinks her elevated bp issues are coming from her head pain. She states she feels like her bp increases when her head is hurting. She did talk with her cardiologist office about this on Monday(see epic phone note), once she told them a list her bp readings they advised her to start on Amlodipine 5 mg. She states that she did start the Amlodipine and her bp reading has been lower but she states she doesn't want it to drop her bp too low. I did advise her that she needed to follow the directions of the cardiology office about her bp medications, and I would let you know what her concerns were and call her back with any further advisement.

## 2015-01-30 ENCOUNTER — Other Ambulatory Visit: Payer: Self-pay | Admitting: *Deleted

## 2015-01-30 NOTE — Telephone Encounter (Signed)
Patient left a voicemail on the refill line requesting refills on this medication. Looks like another provider changed how she takes it, and I am not seeing where Dr Johnsie Cancel has been refilling it. Please advise. Thanks, MI

## 2015-01-30 NOTE — Telephone Encounter (Signed)
Dr. Lenna Gilford is refilling this medication and made the changes. Please have her call his office.

## 2015-01-31 ENCOUNTER — Other Ambulatory Visit: Payer: Self-pay

## 2015-01-31 DIAGNOSIS — M9901 Segmental and somatic dysfunction of cervical region: Secondary | ICD-10-CM | POA: Diagnosis not present

## 2015-01-31 DIAGNOSIS — M5412 Radiculopathy, cervical region: Secondary | ICD-10-CM | POA: Diagnosis not present

## 2015-01-31 DIAGNOSIS — M50322 Other cervical disc degeneration at C5-C6 level: Secondary | ICD-10-CM | POA: Diagnosis not present

## 2015-01-31 MED ORDER — HYDRALAZINE HCL 50 MG PO TABS: 25.0000 mg | ORAL_TABLET | Freq: Three times a day (TID) | ORAL | Status: DC

## 2015-01-31 NOTE — Telephone Encounter (Signed)
Josue Hector, MD at 09/26/2014 8:12 AM  hydrALAZINE (APRESOLINE) 50 MG tablet Take 1/2 tablet by mouth three times daily Medication Instructions:  NO CHANGES

## 2015-02-09 DIAGNOSIS — J301 Allergic rhinitis due to pollen: Secondary | ICD-10-CM | POA: Diagnosis not present

## 2015-02-09 DIAGNOSIS — J3089 Other allergic rhinitis: Secondary | ICD-10-CM | POA: Diagnosis not present

## 2015-02-14 ENCOUNTER — Encounter: Payer: Self-pay | Admitting: Neurology

## 2015-02-14 ENCOUNTER — Ambulatory Visit (INDEPENDENT_AMBULATORY_CARE_PROVIDER_SITE_OTHER): Payer: Medicare Other | Admitting: Neurology

## 2015-02-14 VITALS — BP 120/62 | HR 74 | Ht <= 58 in | Wt 136.0 lb

## 2015-02-14 DIAGNOSIS — R51 Headache: Secondary | ICD-10-CM | POA: Diagnosis not present

## 2015-02-14 DIAGNOSIS — M5481 Occipital neuralgia: Secondary | ICD-10-CM

## 2015-02-14 DIAGNOSIS — I2510 Atherosclerotic heart disease of native coronary artery without angina pectoris: Secondary | ICD-10-CM | POA: Diagnosis not present

## 2015-02-14 DIAGNOSIS — G4486 Cervicogenic headache: Secondary | ICD-10-CM

## 2015-02-14 NOTE — Patient Instructions (Signed)
1. Refer to Physical Therapy for neck pain and cervicogenic headaches 2. Schedule bilateral occipital nerve blocks with Dr. Tomi Likens 3. Continue gabapentin 100mg  daily for now 4. Follow-up in 2 months, call for any problems

## 2015-02-14 NOTE — Progress Notes (Signed)
NEUROLOGY FOLLOW UP OFFICE NOTE  [name] [id number]  HISTORY OF PRESENT ILLNESS: I had the pleasure of seeing [name] in follow-up in the neurology clinic on 02/14/2015.  The patient was last seen 3 months ago for chronic daily headaches suggestive of occipital neuralgia versus cervicogenic headaches. She describes burning pain in the bilateral occipital regions, as well a sharp shooting pain from her neck going up her head. No nausea, vomiting  She had been very hesitant to start gabapentin, but eventually started medication a month ago. She is on very low dose '100mg'$ , taking 1 capsule daily for the past 2 days. She had self-reduced after she feared about an allergic reaction of scalp itching that she reports started after she had been prescribed amlodipine. She starts feeling itching in her scalp, chest, then her head starts hurting and her BP goes up. She has been having more elevated BPs. She denies any falls. She denies any dizziness, diplopia, focal numbness/tingling/weakness.  HPI: This is a pleasant 79 yo RH woman with a history of hypertension, hyperlipidemia, CAD, breast cancer s/p lumpectomy, chemotherapy and radiation, with daily headaches. She had been seeing neurologist [name] since 2014. Records were reviewed. [name] reports a history of migraines since her 38s. These quieted down, until 2014 when she started having headaches that she reports were different from her migraines in the past. It starts in the bilateral occipital region, her neck feels strange, "like it's about to break in the middle," then radiates to the frontal region like a tight band of pressure. This is associated with nausea. Her last migraine was 1 year ago. She was reporting 15 headaches days a month. MRI cervical spine had shown multilevel degenerative disc disease, mild right foraminal stenosis at C3-4 secondary to asymmetric right-sided facet hypertrophy and uncovertebral disease, facet  hypertrophy and disc osteophyte complexes at C4-5 with mild central canal narrowing, mild to moderate left central canal stenosis C5-6 without significant foraminal disease, mild central and left foraminal stenosis at C6-7 secondary to a broad-based disc osteophyte complex and uncovertebral disease, mild left foraminal narrowing at C7-C1 secondary to facet disease. ESR normal. In 2015, she was admitted for hyponatremia and headaches. Headaches had increased in frequency, described as a burning and pressure sensation. Ultram would help some. She was noted to have elevated blood pressure during times of headaches. According to notes, on her last visit in August 2016, she had reported improvement in the headaches but still frequent, and was offered the option of an occipital nerve block.   She reports having the headaches constantly, waxing and waning in intensity from 3/10 to 10/10. She takes Tramadol or Arthritis Tylenol, applying ice helps. She chews ginger for the nausea. In the past, seeing her chiropractor used to help, but not anymore. She had PT for neck pain in 2015 and was told her neck was so tight. She feels slightly better lying down. She has had balance problems but denies any recent falls in the past year. She reports that her BP is overall better except this week when she has had more intense headaches. She started using a CPAP machine last June but has not noticed much change and still does not feel rested on awakening.   PAST MEDICAL HISTORY: Past Medical History  Diagnosis Date  . Diverticulosis of colon (without mention of hemorrhage)   . Irritable bowel syndrome   . Colonic polyp 04-27-2009    tubular adenoma  . Headache(784.0)  a. frequently assocaited with high BPs  . Breast cancer, Left 12/20/2010    NO BLOOD PRESSURE CHECKS OR STICKS IN LEFT ARM  . Gallstones   . CAD (coronary artery disease)     a. 01/19/2010 s/p CABG x 3, lima->lad, vg->diag, vg->om1;  b. 06/2011 :Lexi MV: EF  84%, No ischemia. c. 01/06/14 s/p negative nuclear stress test with EF >70%  . Fibromyalgia   . Atrophic vaginitis   . GERD (gastroesophageal reflux disease)   . Hypercholesterolemia   . Glaucoma   . Cervical arthritis ([place])   . Hiatal hernia   . Labile hypertension   . Asthma     MEDICATIONS: Current Outpatient Prescriptions on File Prior to Visit  Medication Sig Dispense Refill  . Acetaminophen (TYLENOL 8 HOUR PO) Take by mouth as needed.    [name] amLODipine (NORVASC) 5 MG tablet Take 1 tablet (5 mg total) by mouth daily. 30 tablet 6  . aspirin EC 81 MG tablet Take 81 mg by mouth daily.    . clobetasol (TEMOVATE) 0.05 % external solution APPLY TO THE AFFECTED SCALP AREA IN THE MORNING AND EVENING  0  . fexofenadine (ALLEGRA) 180 MG tablet Take 180 mg by mouth daily.      . fluocinonide (LIDEX) 0.05 % external solution Apply 1 application topically daily as needed (scalp itching).     . fluticasone (FLONASE) 50 MCG/ACT nasal spray Takes as needed  0  . gabapentin (NEURONTIN) 100 MG capsule Take 1 capsule at bedtime for 1 week, then increase to 2 capsules at bedtime continuous 60 capsule 3  . Glucosamine-Chondroit-Vit C-Mn (GLUCOSAMINE CHONDR 1500 COMPLX PO) Take 1 tablet by mouth 3 (three) times daily.      . hydrALAZINE (APRESOLINE) 50 MG tablet Take 0.5 tablets (25 mg total) by mouth 3 (three) times daily. 45 tablet 2  . hyoscyamine (ANASPAZ) 0.125 MG TBDP disintergrating tablet Place 0.125 mg under the tongue every 6 (six) hours as needed for bladder spasms or cramping.    [name] ketoconazole (NIZORAL) 2 % shampoo Apply 1 application topically 2 (two) times a week. As needed for itching a couple of times week    . latanoprost (XALATAN) 0.005 % ophthalmic solution Place 1 drop into both eyes at bedtime.     [name] letrozole (FEMARA) 2.5 MG tablet Take 1 tablet (2.5 mg total) by mouth daily. 90 tablet 3  . losartan (COZAAR) 100 MG tablet take 1/2 tablet by mouth three times a day 45 tablet 5  .  magnesium oxide (MAG-OX) 400 MG tablet Take 400 mg by mouth daily.    . metoprolol (LOPRESSOR) 50 MG tablet Take 1 tablet (50 mg total) by mouth 3 (three) times daily. 270 tablet 2  . mometasone (NASONEX) 50 MCG/ACT nasal spray Place 2 sprays into the nose daily as needed (congestion).     . nitroGLYCERIN (NITROSTAT) 0.4 MG SL tablet Place 1 tablet (0.4 mg total) under the tongue every 5 (five) minutes as needed for chest pain. 25 tablet 3  . Omega-3 Fatty Acids (FISH OIL) 1200 MG CAPS Take 1,200 mg by mouth daily.     [name] omeprazole (PRILOSEC) 40 MG capsule take 1 capsule by mouth once daily 30 MINUTES BEFORE 1ST MEAL OF THE DAY 30 capsule 4  . PRESCRIPTION MEDICATION Vitamin B 2    . ranitidine (ZANTAC) 300 MG capsule Take 1 capsule (300 mg total) by mouth every evening. 30 capsule 3  . traMADol (ULTRAM) 50 MG tablet take 1  tablet by mouth three times a day if needed 90 tablet 5  . vitamin B-12 (CYANOCOBALAMIN) 1000 MCG tablet Take 1,000 mcg by mouth daily.       No current facility-administered medications on file prior to visit.    ALLERGIES: Allergies  Allergen Reactions  . Choline Fenofibrate Other (See Comments)     pt states INTOL to Trilipix w/ "thigh burning"  . Simvastatin Other (See Comments)     pt states INTOL to STATINS \\T \ refuses to restart  . Hctz [Hydrochlorothiazide]     Causes hyponatremia  . Hydrocodone     Nightmare after taking cough syrup w/hydrocodone  . Levaquin [Levofloxacin In D5w]     Elevated BP  . Adhesive [Tape] Rash  . Ceclor [Cefaclor] Rash  . Clarithromycin Rash  . Codeine Nausea Only  . Doxycycline Rash  . Lisinopril Cough    Developed ACE cough...  . Penicillins Itching and Rash    At injection site  . Tobramycin-Dexamethasone Rash    FAMILY HISTORY: Family History  Problem Relation Age of Onset  . Heart disease Brother   . Cancer Brother     gland cancer  . Diabetes Sister   . Breast cancer Sister   . Stroke Mother   . Stroke Father    . Colon cancer Neg Hx   . Stomach cancer Neg Hx   . Pancreatic cancer Neg Hx   . Prostate cancer Father   . Kidney disease Neg Hx   . Liver disease Neg Hx     SOCIAL HISTORY: Social History   Social History  . Marital Status: Married    Spouse Name: Lorin Picket. Heskett  . Number of Children: 2  . Years of Education: 12   Occupational History  . part time preschool teacher-retired    Social History Main Topics  . Smoking status: Never Smoker   . Smokeless tobacco: Never Used  . Alcohol Use: No  . Drug Use: No  . Sexual Activity: Yes    Birth Control/ Protection: Post-menopausal   Other Topics Concern  . Not on file   Social History Narrative   Patient lives at home with her husband Maisie Fus). Patient is retired. Patient  Has 12 th grade education.    Caffeine- sometimes- One cup of coffee.   Right handed.   Four granddaughters.    REVIEW OF SYSTEMS: Constitutional: No fevers, chills, or sweats, no generalized fatigue, change in appetite Eyes: No visual changes, double vision, eye pain Ear, nose and throat: No hearing loss, ear pain, nasal congestion, sore throat Cardiovascular: No chest pain, palpitations Respiratory:  No shortness of breath at rest or with exertion, wheezes GastrointestinaI: No nausea, vomiting, diarrhea, abdominal pain, fecal incontinence Genitourinary:  No dysuria, urinary retention or frequency Musculoskeletal:  + neck pain, back pain Integumentary: No rash, pruritus, skin lesions Neurological: as above Psychiatric: No depression, insomnia, anxiety Endocrine: No palpitations, fatigue, diaphoresis, mood swings, change in appetite, change in weight, increased thirst Hematologic/Lymphatic:  No anemia, purpura, petechiae. Allergic/Immunologic: no itchy/runny eyes, nasal congestion, recent allergic reactions, rashes  PHYSICAL EXAM: Filed Vitals:   02/14/15 0947  BP: 120/62   General: No acute distress Head:  Normocephalic/atraumatic, +  tenderness to palpation over the bilateral occipital regions Neck: supple, + paraspinal tenderness, full range of motion Heart:  Regular rate and rhythm Lungs:  Clear to auscultation bilaterally Back: No paraspinal tenderness Skin/Extremities: No rash, no edema Neurological Exam: alert and oriented to person, place, and time. No  aphasia or dysarthria. Fund of knowledge is appropriate.  Recent and remote memory are intact.  Attention and concentration are normal.    Able to name objects and repeat phrases. Cranial nerves: Pupils equal, round, reactive to light.  Fundoscopic exam unremarkable, no papilledema. Extraocular movements intact with no nystagmus. Visual fields full. Facial sensation intact. No facial asymmetry. Tongue, uvula, palate midline.  Motor: Bulk and tone normal, muscle strength 5/5 throughout with no pronator drift.  Sensation to light touch intact.  No extinction to double simultaneous stimulation.  Deep tendon reflexes 2+ throughout, toes downgoing.  Finger to nose testing intact.  Gait narrow-based and steady, able to tandem walk adequately.  Romberg negative.  IMPRESSION: This is a pleasant 79 yo RH woman with a history of hypertension, sleep apnea, migraines, with chronic daily headaches over the occipital and neck regions. History and exam suggestive of occipital neuralgia versus cervicogenic headaches. She does have degenerative disease on her cervical MRI. [name] had previously discussed occipital nerve blocks with her, which I again discussed today and she agrees to do. She would also hopefully benefit from PT for cervicogenic headaches and neck pain, and agrees to proceed today. Continue low dose gabapentin '100mg'$  daily for now. It is unclear which medication she is reacting to, she started having itching after taking amlodipine and will discuss this with her cardiologist and PCP. She will follow-up in 2 months.   Thank you for allowing me to participate in her care.  Please do  not hesitate to call for any questions or concerns.  The duration of this appointment visit was 24 minutes of face-to-face time with the patient.  Greater than 50% of this time was spent in counseling, explanation of diagnosis, planning of further management, and coordination of care.   [name], M.D.   CC: Dr. Lenna Gilford

## 2015-02-21 ENCOUNTER — Ambulatory Visit: Payer: Medicare Other | Attending: Neurology | Admitting: Physical Therapy

## 2015-02-21 DIAGNOSIS — M62838 Other muscle spasm: Secondary | ICD-10-CM

## 2015-02-21 DIAGNOSIS — M542 Cervicalgia: Secondary | ICD-10-CM | POA: Diagnosis not present

## 2015-02-21 DIAGNOSIS — M6248 Contracture of muscle, other site: Secondary | ICD-10-CM | POA: Diagnosis not present

## 2015-02-21 DIAGNOSIS — M9901 Segmental and somatic dysfunction of cervical region: Secondary | ICD-10-CM | POA: Diagnosis not present

## 2015-02-21 DIAGNOSIS — M545 Low back pain: Secondary | ICD-10-CM | POA: Diagnosis not present

## 2015-02-21 DIAGNOSIS — M9903 Segmental and somatic dysfunction of lumbar region: Secondary | ICD-10-CM | POA: Diagnosis not present

## 2015-02-21 DIAGNOSIS — R293 Abnormal posture: Secondary | ICD-10-CM | POA: Diagnosis not present

## 2015-02-21 DIAGNOSIS — M5382 Other specified dorsopathies, cervical region: Secondary | ICD-10-CM | POA: Diagnosis not present

## 2015-02-21 NOTE — Therapy (Signed)
[place], [place], [address] Phone: [phone]   Fax:  [phone]  Physical Therapy Evaluation  Patient Details  Name: [name] MRN: [id number] Date of Birth: 1935-05-09 Referring Provider: Cameron Sprang  Encounter Date: 02/21/2015      PT End of Session - 02/21/15 1647    Visit Number 1   Number of Visits 6   Date for PT Re-Evaluation 04/04/15   Authorization Type Medicare: kx mod by 15th visit, progress note by 9th visit   PT Start Time 1500   PT Stop Time 1545   PT Time Calculation (min) 45 min   Activity Tolerance Patient tolerated treatment well   Behavior During Therapy [facility] for tasks assessed/performed      Past Medical History  Diagnosis Date  . Diverticulosis of colon (without mention of hemorrhage)   . Irritable bowel syndrome   . Colonic polyp 04-27-2009    tubular adenoma  . Headache(784.0)     a. frequently assocaited with high BPs  . Breast cancer, Left 12/20/2010    NO BLOOD PRESSURE CHECKS OR STICKS IN LEFT ARM  . Gallstones   . CAD (coronary artery disease)     a. 01/19/2010 s/p CABG x 3, lima->lad, vg->diag, vg->om1;  b. 06/2011 :Lexi MV: EF 84%, No ischemia. c. 01/06/14 s/p negative nuclear stress test with EF >70%  . Fibromyalgia   . Atrophic vaginitis   . GERD (gastroesophageal reflux disease)   . Hypercholesterolemia   . Glaucoma   . Cervical arthritis ([place])   . Hiatal hernia   . Labile hypertension   . Asthma     Past Surgical History  Procedure Laterality Date  . Coronary angioplasty with stent placement      Stent 2007  . Open heart surgery      01/19/2010  . Coronary artery bypass graft    . Cataract extraction, bilateral      bilateral caaract removal,  . Breast lumpectomy Left 02/06/11    There were no vitals filed for this visit.  Visit Diagnosis:  Neck pain - Plan: PT plan of care cert/re-cert  Muscle spasms of neck - Plan: PT plan of  care cert/re-cert  Decreased ROM of intervertebral discs of cervical spine - Plan: PT plan of care cert/re-cert  Abnormal posture - Plan: PT plan of care cert/re-cert      Subjective Assessment - 02/21/15 1508    Subjective pt is a 79 y.o F with CC of neck pain/ muscle spasm and headaches that has been going on for along time, that started insidiously. She reports that the pain fluctates off and on. she reports seeing a PT in the past for this but didn't have much relief.  She reports pain goes into a band around the top of her head and occassionally down her R arm into the hands.    Limitations Lifting;House hold activities   How long can you sit comfortably? 30  min   How long can you stand comfortably? unlimited   How long can you walk comfortably? unlimited   Diagnostic tests x-ray per pt report arthritis   Patient Stated Goals To feel better, to decrease tight   Currently in Pain? Yes   Pain Score 9    Pain Location Neck   Pain Orientation Right;Left;Mid   Pain Descriptors / Indicators Aching;Tightness;Burning   Pain Type Chronic pain   Pain Radiating Towards into the head that wraps around the front  Pain Onset More than a month ago   Pain Frequency Constant   Aggravating Factors  reaching out forward and scrubbing, anything   Pain Relieving Factors pain medication, ice/ heat            [facility] PT Assessment - 02/21/15 1517    Assessment   Medical Diagnosis cervicogenic headaches   Referring Provider Cameron Sprang   Onset Date/Surgical Date --  many years   Hand Dominance Right   Next MD Visit 02/27/2014   Prior Therapy yes   Precautions   Precautions None   Restrictions   Weight Bearing Restrictions No   Balance Screen   Has the patient fallen in the past 6 months Yes   How many times? 1   Has the patient had a decrease in activity level because of a fear of falling?  No   Is the patient reluctant to leave their home because of a fear of falling?  No   Home  Environment   Living Environment Private residence   Living Arrangements Spouse/significant other   Available Help at Discharge Available 24 hours/day;Available PRN/intermittently   Type of Home House   Home Access Stairs to enter   Entrance Stairs-Number of Steps 1   Home Layout One level   Home Equipment None   Prior Function   Level of Independence Independent;Independent with basic ADLs   Vocation Retired   Charity fundraiser Status Within Functional Limits for tasks assessed   Observation/Other Assessments   Focus on Therapeutic Outcomes (FOTO)  48% limited  predicted 42% limited   Posture/Postural Control   Posture/Postural Control Postural limitations   Postural Limitations Rounded Shoulders;Forward head   ROM / Strength   AROM / PROM / Strength AROM;PROM   AROM   AROM Assessment Site Cervical   Cervical Flexion 32   Cervical Extension 28   Cervical - Right Side Bend 30   Cervical - Left Side Bend 20   Cervical - Right Rotation 40   Cervical - Left Rotation 45   Palpation   Spinal mobility hypomobility intervertebral movemebts of cervical spine with tenderness noted upon palpation   Palpation comment spams of bil upper traps, levator scap and sub-occipital tightness   Special Tests    Special Tests Cervical   Cervical Tests Spurling's;Dictraction   Spurling's   Findings Negative   Distraction Test   Findngs Negative                   [facility] Adult PT Treatment/Exercise - 02/21/15 1517    Neck Exercises: Supine   Neck Retraction 10 reps;5 secs   Manual Therapy   Manual Therapy Myofascial release   Myofascial Release sub-occipital release                PT Education - 02/21/15 1647    Education provided Yes   Education Details evaluation findings, POC, Goals, HEP   Person(s) Educated Patient   Methods Explanation   Comprehension Verbalized understanding          PT Short Term Goals - 02/21/15 1652    PT SHORT TERM GOAL #1    Title pt will be I with initial HEP  (03/14/2015)   Time 3   Period Weeks   Status New   PT SHORT TERM GOAL #2   Title pt will be able to verbalize and demonstrate techniques to reduce neck pain and headaches via postural awarenss, lifting and carrying mechanics and HEP (  03/14/2015)   Time 3   Period Weeks   Status New           PT Long Term Goals - 02/27/15 1654    PT LONG TERM GOAL #1   Title pt will be I with all HEP as of last visit ( 04/04/2015)   Time 6   Period Weeks   Status New   PT LONG TERM GOAL #2   Title pt will demonstrate reduced muscle spasm to promote improvement of cervical moblity and reduce pain to </=4/10 ( 04/04/2015)    Time 6   Period Weeks   Status New   PT LONG TERM GOAL #3   Title pt will increase her cervical moblity by >/=8 degrees with </= 4/10 pain  in all planes to assist with safety during driving ( [id number])    Time 6   Period Weeks   Status New   PT LONG TERM GOAL #4   Title pt will be able to lift and carry >/=10# at shoulder height or higher with </= 4/10 pain to assist with ADLS ([id number])   Time 6   Period Weeks   Status New   PT LONG TERM GOAL #5   Title pt will improve her FOTO score to >/= 58 to demontrate improved function at discharge (04/04/2015)    Time [place] - 27-Feb-2015 1648    Clinical Impression Statement Lenda presents to OPPT with CC neck pain, muscle tightness with headaches that has been present for a long time. She dmeosntrates limited cervical AROM due to pain and muscle tightness in all planes. Palpation revaled signifcant tightness in bil upper traps, levator scapulae, and sub-occipoitsl with hypomobility of C3-C7 intervertebral segments. Speciall testing was negative with spurlings and distraction. Following DNF and sub-occipital relief she reported relief of the headach and tightness in the back of the neck. She would benefit from physcial therapy to decreaes her neck  pain and tightness and return to her PLOF by addressing the impairments listed.    Pt will benefit from skilled therapeutic intervention in order to improve on the following deficits Increased muscle spasms;Pain;Improper body mechanics;Postural dysfunction;Hypomobility;Decreased activity tolerance;Decreased endurance;Decreased range of motion   Rehab Potential Good   PT Frequency 1x / week   PT Duration 6 weeks   PT Treatment/Interventions ADLs/Self Care Home Management;Electrical Stimulation;Cryotherapy;Iontophoresis 4mg /ml Dexamethasone;Moist Heat;Therapeutic exercise;Therapeutic activities;Taping;Manual techniques;Dry needling;Patient/family education;Neuromuscular re-education;Ultrasound   PT Next Visit Plan assess response to HEP, manual for tight cervical muscles, Modalities for pain, posture education   PT Home Exercise Plan DNF, rows, upper trap stretching,    Consulted and Agree with Plan of Care Patient          G-Codes - 02/27/15 1659    Functional Assessment Tool Used FOTO 48% limited   Functional Limitation Changing and maintaining body position   Changing and Maintaining Body Position Current Status [id number]) At least 40 percent but less than 60 percent impaired, limited or restricted   Changing and Maintaining Body Position Goal Status [id number]) At least 20 percent but less than 40 percent impaired, limited or restricted       Problem List Patient Active Problem List   Diagnosis Date Noted  . Bilateral occipital neuralgia 11/23/2014  . Cervicogenic headache 11/23/2014  . Neck pain 11/23/2014  . OSA on CPAP 11/23/2014  . Sleep apnea 03/10/2014  . AP (  abdominal pain) 02/07/2014  . History of diverticulitis 02/07/2014  . Generalized anxiety disorder 02/04/2014  . Labile hypertension   . Fibromyalgia   . Chest pain, midsternal 01/05/2014  . Cervical spondylolysis 12/24/2013  . CAD (coronary artery disease) 12/24/2013  . Hyponatremia 12/23/2013  . Hypertensive emergency  12/23/2013  . Acute encephalopathy 12/23/2013  . Bronchitis, chronic obstructive w acute bronchitis ([place]) 12/21/2013  . Diverticulosis of colon without hemorrhage 11/15/2013  . Hot flashes related to aromatase inhibitor therapy 07/27/2013  . Osteopenia 07/27/2013  . DJD (degenerative joint disease) 09/29/2012  . Breast cancer, Left 12/20/2010  . Leg pain 11/28/2010  . FATTY LIVER DISEASE 01/09/2010  . GASTRITIS 12/16/2007  . Allergic rhinitis 02/18/2007  . DIZZINESS, CHRONIC 02/18/2007  . Headache in back of head 02/18/2007  . Mixed hyperlipidemia 12/23/2006  . GLAUCOMA 12/23/2006  . Coronary atherosclerosis 12/23/2006  . Asthma 12/23/2006  . GERD 12/23/2006  . IRRITABLE BOWEL SYNDROME 12/23/2006  . Myalgia and myositis 12/23/2006   Starr Lake PT, DPT, LAT, ATC  02/21/2015  5:03 PM     [facility] [address] [place], [place], [address] Phone: [phone]   Fax:  [phone]  Name: ILANA FAHEY MRN: [id number] Date of Birth: 1936/01/23

## 2015-02-21 NOTE — Patient Instructions (Signed)
   Daven Pinckney PT, DPT, LAT, ATC  [facility] Phone: [phone]     

## 2015-02-22 ENCOUNTER — Telehealth: Payer: Self-pay | Admitting: Neurology

## 2015-02-22 NOTE — Telephone Encounter (Signed)
She is on a low dose, and gabapentin is used for anxiety, so unlikely, however if she feels this is the cause, she can try stopping medication for a week and see if symptoms go away. Thanks

## 2015-02-22 NOTE — Telephone Encounter (Signed)
Patient returned my call. She states she's noticed that since she's been taking the gabapentin that she is easily agitated. Wants to know if you think this could be coming from the medication.

## 2015-02-22 NOTE — Telephone Encounter (Signed)
Lmovm to rtn my call. 

## 2015-02-22 NOTE — Telephone Encounter (Signed)
PT called in regards to her gabapentin/Dawn CB# [phone]

## 2015-02-22 NOTE — Telephone Encounter (Signed)
Called patient and notified of advisement. 

## 2015-02-23 ENCOUNTER — Ambulatory Visit: Payer: Medicare Other | Admitting: Neurology

## 2015-02-28 ENCOUNTER — Encounter: Payer: Self-pay | Admitting: Neurology

## 2015-02-28 ENCOUNTER — Ambulatory Visit: Payer: Medicare Other | Attending: Neurology | Admitting: Physical Therapy

## 2015-02-28 ENCOUNTER — Ambulatory Visit (INDEPENDENT_AMBULATORY_CARE_PROVIDER_SITE_OTHER): Payer: Medicare Other | Admitting: Neurology

## 2015-02-28 VITALS — BP 116/64 | HR 65

## 2015-02-28 VITALS — BP 150/82 | HR 63

## 2015-02-28 DIAGNOSIS — M6248 Contracture of muscle, other site: Secondary | ICD-10-CM | POA: Diagnosis not present

## 2015-02-28 DIAGNOSIS — M542 Cervicalgia: Secondary | ICD-10-CM | POA: Diagnosis not present

## 2015-02-28 DIAGNOSIS — M5382 Other specified dorsopathies, cervical region: Secondary | ICD-10-CM | POA: Insufficient documentation

## 2015-02-28 DIAGNOSIS — M5481 Occipital neuralgia: Secondary | ICD-10-CM

## 2015-02-28 DIAGNOSIS — R293 Abnormal posture: Secondary | ICD-10-CM

## 2015-02-28 DIAGNOSIS — M62838 Other muscle spasm: Secondary | ICD-10-CM

## 2015-02-28 MED ORDER — TRIAMCINOLONE ACETONIDE 40 MG/ML IJ SUSP
40.0000 mg | Freq: Once | INTRAMUSCULAR | Status: AC
  Administered 2015-02-28: 40 mg via INTRAMUSCULAR

## 2015-02-28 MED ORDER — BUPIVACAINE HCL 0.5 % IJ SOLN
4.0000 mL | Freq: Once | INTRAMUSCULAR | Status: AC
  Administered 2015-02-28: 4 mL

## 2015-02-28 NOTE — Progress Notes (Signed)
See procedure note.

## 2015-02-28 NOTE — Therapy (Signed)
[place], [place], [address] Phone: [phone]   Fax:  [phone]  Physical Therapy Treatment  Patient Details  Name: [name] MRN: [id number] Date of Birth: October 15, 1935 Referring Provider: Cameron Sprang  Encounter Date: 02/28/2015      PT End of Session - 02/28/15 1015    Visit Number 2   Number of Visits 6   Authorization Type Medicare: kx mod by 15th visit, progress note by 9th visit   PT Start Time 0930   PT Stop Time 1022   PT Time Calculation (min) 52 min   Activity Tolerance Patient tolerated treatment well   Behavior During Therapy [facility] for tasks assessed/performed      Past Medical History  Diagnosis Date  . Diverticulosis of colon (without mention of hemorrhage)   . Irritable bowel syndrome   . Colonic polyp 04-27-2009    tubular adenoma  . Headache(784.0)     a. frequently assocaited with high BPs  . Breast cancer, Left 12/20/2010    NO BLOOD PRESSURE CHECKS OR STICKS IN LEFT ARM  . Gallstones   . CAD (coronary artery disease)     a. 01/19/2010 s/p CABG x 3, lima->lad, vg->diag, vg->om1;  b. 06/2011 :Lexi MV: EF 84%, No ischemia. c. 01/06/14 s/p negative nuclear stress test with EF >70%  . Fibromyalgia   . Atrophic vaginitis   . GERD (gastroesophageal reflux disease)   . Hypercholesterolemia   . Glaucoma   . Cervical arthritis ([place])   . Hiatal hernia   . Labile hypertension   . Asthma     Past Surgical History  Procedure Laterality Date  . Coronary angioplasty with stent placement      Stent 2007  . Open heart surgery      01/19/2010  . Coronary artery bypass graft    . Cataract extraction, bilateral      bilateral caaract removal,  . Breast lumpectomy Left 02/06/11    Filed Vitals:   02/28/15 0930  BP: 150/82  Pulse: 63  SpO2: 97%    Visit Diagnosis:  Neck pain  Muscle spasms of neck  Decreased ROM of intervertebral discs of cervical spine  Abnormal  posture      Subjective Assessment - 02/28/15 0930    Subjective pt reports that she is feeling about the same today. pt reports that she is feeling more lightheaded that comes and goes and doesn't seem to matter what her position she is in, and has reported feeling like she has been having a fluttering and palpitations   Currently in Pain? Yes   Pain Score 6    Pain Location Neck   Pain Orientation Right;Left;Mid   Pain Descriptors / Indicators Aching;Tightness;Burning   Pain Type Chronic pain   Pain Onset More than a month ago   Pain Frequency Constant   Aggravating Factors  reaching out forward   Pain Relieving Factors pain medcation, ice/ heat                         [facility] Adult PT Treatment/Exercise - 02/28/15 1014    Neck Exercises: Theraband   Scapula Retraction 15 reps;Red   Neck Exercises: Standing   Neck Retraction 10 reps;5 secs   Other Standing Exercises rows 2 x 10 red theraband  VC to keep shoulders down   Modalities   Modalities Moist Heat   Moist Heat Therapy   Moist Heat Location Cervical  Manual Therapy   Manual Therapy Joint mobilization;Myofascial release;Soft tissue mobilization   Joint Mobilization grade 2 cervical mobs P>A and unilateral   Myofascial Release sub-occipital release, R upper trap and levator scap manual trigger point release   Neck Exercises: Stretches   Upper Trapezius Stretch 2 reps;30 seconds   Levator Stretch 2 reps;30 seconds                  PT Short Term Goals - 02/28/15 1018    PT SHORT TERM GOAL #1   Title pt will be I with initial HEP  (03/14/2015)   Time 3   Period Weeks   Status On-going   PT SHORT TERM GOAL #2   Title pt will be able to verbalize and demonstrate techniques to reduce neck pain and headaches via postural awarenss, lifting and carrying mechanics and HEP (03/14/2015)   Time 3   Period Weeks   Status On-going           PT Long Term Goals - 02/28/15 1018    PT LONG TERM GOAL  #1   Title pt will be I with all HEP as of last visit ( 04/04/2015)   Time 6   Period Weeks   Status On-going   PT LONG TERM GOAL #2   Title pt will demonstrate reduced muscle spasm to promote improvement of cervical moblity and reduce pain to </=4/10 ( 04/04/2015)    Time 6   Period Weeks   Status On-going   PT LONG TERM GOAL #3   Title pt will increase her cervical moblity by >/=8 degrees with </= 4/10 pain  in all planes to assist with safety during driving ( [id number])    Time 6   Period Weeks   Status On-going   PT LONG TERM GOAL #4   Title pt will be able to lift and carry >/=10# at shoulder height or higher with </= 4/10 pain to assist with ADLS ([id number])   Time 6   Period Weeks   Status On-going   PT LONG TERM GOAL #5   Title pt will improve her FOTO score to >/= 58 to demontrate improved function at discharge (04/04/2015)    Time 6   Period Weeks   Status On-going               Plan - 02/28/15 1015    Clinical Impression Statement [name] reports that she has been feeling flushed and having intermittnet bouts of dizziness with palpitations her vitals berfore therapy were 152/80, and HR 63, and O2 97%. She reports she plans to call her cardio physician because this has been going on for a little while. Focused todays session on HEP review, manual for muscle tightness and posture education regarding keeping shoulders down during exercises. She rpeorted feeling much better following todays session. utlized MHP following to decrease any soreness from todays manual treatment.    PT Next Visit Plan  manual for tight cervical muscles, Modalities for pain, posture education   PT Home Exercise Plan HEP review   Consulted and Agree with Plan of Care Patient        Problem List Patient Active Problem List   Diagnosis Date Noted  . Bilateral occipital neuralgia 11/23/2014  . Cervicogenic headache 11/23/2014  . Neck pain 11/23/2014  . OSA on CPAP 11/23/2014  . Sleep apnea  03/10/2014  . AP (abdominal pain) 02/07/2014  . History of diverticulitis 02/07/2014  . Generalized anxiety disorder 02/04/2014  .  Labile hypertension   . Fibromyalgia   . Chest pain, midsternal 01/05/2014  . Cervical spondylolysis 12/24/2013  . CAD (coronary artery disease) 12/24/2013  . Hyponatremia 12/23/2013  . Hypertensive emergency 12/23/2013  . Acute encephalopathy 12/23/2013  . Bronchitis, chronic obstructive w acute bronchitis ([place]) 12/21/2013  . Diverticulosis of colon without hemorrhage 11/15/2013  . Hot flashes related to aromatase inhibitor therapy 07/27/2013  . Osteopenia 07/27/2013  . DJD (degenerative joint disease) 09/29/2012  . Breast cancer, Left 12/20/2010  . Leg pain 11/28/2010  . FATTY LIVER DISEASE 01/09/2010  . GASTRITIS 12/16/2007  . Allergic rhinitis 02/18/2007  . DIZZINESS, CHRONIC 02/18/2007  . Headache in back of head 02/18/2007  . Mixed hyperlipidemia 12/23/2006  . GLAUCOMA 12/23/2006  . Coronary atherosclerosis 12/23/2006  . Asthma 12/23/2006  . GERD 12/23/2006  . IRRITABLE BOWEL SYNDROME 12/23/2006  . Myalgia and myositis 12/23/2006   Starr Lake PT, DPT, LAT, ATC  02/28/2015  10:22 AM      [facility] [address] [place], [place], [address] Phone: [phone]   Fax:  [phone]  Name: [name] MRN: [id number] Date of Birth: Apr 18, 1935

## 2015-02-28 NOTE — Procedures (Signed)
Procedure: Occipital nerve block  Procedure risks, benefits and alternative treatments explained to patient and they agree to proceed.   A concentration of 0.25% bupivacaine (3 ml) was mixed with 40 mg of Kenalog (1 ml). A 27 gauge needle was used for injection. The region of the right greater occipital nerve was located by palpation. The area was prepped with alcohol. A total of 2 cc of the above mixture was injected without difficulty. The patient tolerated the procedure well.    [name] R. Tomi Likens, DO

## 2015-03-01 DIAGNOSIS — J3089 Other allergic rhinitis: Secondary | ICD-10-CM | POA: Diagnosis not present

## 2015-03-01 DIAGNOSIS — J301 Allergic rhinitis due to pollen: Secondary | ICD-10-CM | POA: Diagnosis not present

## 2015-03-07 DIAGNOSIS — M9903 Segmental and somatic dysfunction of lumbar region: Secondary | ICD-10-CM | POA: Diagnosis not present

## 2015-03-07 DIAGNOSIS — M545 Low back pain: Secondary | ICD-10-CM | POA: Diagnosis not present

## 2015-03-07 DIAGNOSIS — M542 Cervicalgia: Secondary | ICD-10-CM | POA: Diagnosis not present

## 2015-03-07 DIAGNOSIS — M9901 Segmental and somatic dysfunction of cervical region: Secondary | ICD-10-CM | POA: Diagnosis not present

## 2015-03-08 ENCOUNTER — Ambulatory Visit: Payer: Medicare Other | Admitting: Physical Therapy

## 2015-03-08 DIAGNOSIS — M542 Cervicalgia: Secondary | ICD-10-CM | POA: Diagnosis not present

## 2015-03-08 DIAGNOSIS — M5382 Other specified dorsopathies, cervical region: Secondary | ICD-10-CM

## 2015-03-08 DIAGNOSIS — M62838 Other muscle spasm: Secondary | ICD-10-CM

## 2015-03-08 DIAGNOSIS — R293 Abnormal posture: Secondary | ICD-10-CM

## 2015-03-08 DIAGNOSIS — M6248 Contracture of muscle, other site: Secondary | ICD-10-CM | POA: Diagnosis not present

## 2015-03-08 NOTE — Therapy (Signed)
[place], [place], [address] Phone: [phone]   Fax:  [phone]  Physical Therapy Treatment  Patient Details  Name: [name] MRN: [id number] Date of Birth: 1935/06/29 Referring Provider: Cameron Sprang  Encounter Date: 03/08/2015      PT End of Session - 03/08/15 1011    Visit Number 3   Number of Visits 6   Date for PT Re-Evaluation 04/04/15   PT Start Time 0929   PT Stop Time 1016   PT Time Calculation (min) 47 min   Activity Tolerance Patient tolerated treatment well   Behavior During Therapy [facility] for tasks assessed/performed      Past Medical History  Diagnosis Date  . Diverticulosis of colon (without mention of hemorrhage)   . Irritable bowel syndrome   . Colonic polyp 04-27-2009    tubular adenoma  . Headache(784.0)     a. frequently assocaited with high BPs  . Breast cancer, Left 12/20/2010    NO BLOOD PRESSURE CHECKS OR STICKS IN LEFT ARM  . Gallstones   . CAD (coronary artery disease)     a. 01/19/2010 s/p CABG x 3, lima->lad, vg->diag, vg->om1;  b. 06/2011 :Lexi MV: EF 84%, No ischemia. c. 01/06/14 s/p negative nuclear stress test with EF >70%  . Fibromyalgia   . Atrophic vaginitis   . GERD (gastroesophageal reflux disease)   . Hypercholesterolemia   . Glaucoma   . Cervical arthritis ([place])   . Hiatal hernia   . Labile hypertension   . Asthma     Past Surgical History  Procedure Laterality Date  . Coronary angioplasty with stent placement      Stent 2007  . Open heart surgery      01/19/2010  . Coronary artery bypass graft    . Cataract extraction, bilateral      bilateral caaract removal,  . Breast lumpectomy Left 02/06/11    There were no vitals filed for this visit.  Visit Diagnosis:  Neck pain  Muscle spasms of neck  Decreased ROM of intervertebral discs of cervical spine  Abnormal posture      Subjective Assessment - 03/08/15 0932    Subjective  Pt. reports feeling fine though had "a bad night" with blood pressure issues. Pt was asked if today she felt any dizziness or had any of the same complaints and reported that she did not, so no vitals were taken.  Also has been unable to contact her doctor concerning the cardiac issues. Pt. reported that she recieved "a shot in her neck".   Currently in Pain? No/denies                         [facility] Adult PT Treatment/Exercise - 03/08/15 0938    Neck Exercises: Theraband   Rows 20 reps;Red   Rows Limitations low row with scapular retraction   Neck Exercises: Seated   Cervical Isometrics Flexion;10 reps;5 secs   Cervical Isometrics Limitations upper cervical flexion, resist with hand   Neck Retraction 10 reps;5 secs   Modalities   Modalities Moist Heat;Electrical Stimulation   Moist Heat Therapy   Number Minutes Moist Heat 15 Minutes   Moist Heat Location Cervical   Electrical Stimulation   Electrical Stimulation Location cervical    Electrical Stimulation Action IFC   Electrical Stimulation Parameters to tolerance   Electrical Stimulation Goals Pain   Manual Therapy   Manual Therapy Joint mobilization  Manual therapy comments level C3-C5 lateral (left to right)   Joint Mobilization grade 3   Neck Exercises: Stretches   Upper Trapezius Stretch 1 rep;30 seconds  both sides   Levator Stretch 1 rep;30 seconds  both sides                PT Education - 03/08/15 1009    Education provided Yes   Education Details correct posture during exercises, isometric upper cervical flexion   Person(s) Educated Patient   Methods Explanation;Demonstration   Comprehension Verbalized understanding;Returned demonstration;Need further instruction          PT Short Term Goals - 02/28/15 1018    PT SHORT TERM GOAL #1   Title pt will be I with initial HEP  (03/14/2015)   Time 3   Period Weeks   Status On-going   PT SHORT TERM GOAL #2   Title pt will be able to  verbalize and demonstrate techniques to reduce neck pain and headaches via postural awarenss, lifting and carrying mechanics and HEP (03/14/2015)   Time 3   Period Weeks   Status On-going           PT Long Term Goals - 02/28/15 1018    PT LONG TERM GOAL #1   Title pt will be I with all HEP as of last visit ( 04/04/2015)   Time 6   Period Weeks   Status On-going   PT LONG TERM GOAL #2   Title pt will demonstrate reduced muscle spasm to promote improvement of cervical moblity and reduce pain to </=4/10 ( 04/04/2015)    Time 6   Period Weeks   Status On-going   PT LONG TERM GOAL #3   Title pt will increase her cervical moblity by >/=8 degrees with </= 4/10 pain  in all planes to assist with safety during driving ( [id number])    Time 6   Period Weeks   Status On-going   PT LONG TERM GOAL #4   Title pt will be able to lift and carry >/=10# at shoulder height or higher with </= 4/10 pain to assist with ADLS ([id number])   Time 6   Period Weeks   Status On-going   PT LONG TERM GOAL #5   Title pt will improve her FOTO score to >/= 58 to demontrate improved function at discharge (04/04/2015)    Time 6   Period Weeks   Status On-going               Plan - 03/08/15 1012    Clinical Impression Statement Pt reported problems with her blood pressure last night, but none today. Pt. has not yet been in contact with doctor about it. Pt. reported recieving a shot for her neck since her last therapy session. Pt handled treatment well with no complaints of dizziness or any other concerns throughout. Pt continuing to work on postural corrections, especialy during exercises to prevent reinjury. Manual therapy administered to decrease neck stiffness. Added electtrical stimulation with MHP  to decrease pain and soreness from todays treatment. Pt reported that e-stim  felt good and helped.    PT Next Visit Plan modalities for pain, posture education, neck and shoulder stabilization strengthening         Problem List Patient Active Problem List   Diagnosis Date Noted  . Bilateral occipital neuralgia 11/23/2014  . Cervicogenic headache 11/23/2014  . Neck pain 11/23/2014  . OSA on CPAP 11/23/2014  . Sleep apnea 03/10/2014  .  AP (abdominal pain) 02/07/2014  . History of diverticulitis 02/07/2014  . Generalized anxiety disorder 02/04/2014  . Labile hypertension   . Fibromyalgia   . Chest pain, midsternal 01/05/2014  . Cervical spondylolysis 12/24/2013  . CAD (coronary artery disease) 12/24/2013  . Hyponatremia 12/23/2013  . Hypertensive emergency 12/23/2013  . Acute encephalopathy 12/23/2013  . Bronchitis, chronic obstructive w acute bronchitis ([place]) 12/21/2013  . Diverticulosis of colon without hemorrhage 11/15/2013  . Hot flashes related to aromatase inhibitor therapy 07/27/2013  . Osteopenia 07/27/2013  . DJD (degenerative joint disease) 09/29/2012  . Breast cancer, Left 12/20/2010  . Leg pain 11/28/2010  . FATTY LIVER DISEASE 01/09/2010  . GASTRITIS 12/16/2007  . Allergic rhinitis 02/18/2007  . DIZZINESS, CHRONIC 02/18/2007  . Headache in back of head 02/18/2007  . Mixed hyperlipidemia 12/23/2006  . GLAUCOMA 12/23/2006  . Coronary atherosclerosis 12/23/2006  . Asthma 12/23/2006  . GERD 12/23/2006  . IRRITABLE BOWEL SYNDROME 12/23/2006  . Myalgia and myositis 12/23/2006      Ammie Ferrier, SPT 03/08/2015  12:31 PM   [facility] [address] [place], [place], [address] Phone: [phone]   Fax:  [phone]  Name: [name] MRN: [id number] Date of Birth: 09-May-1935

## 2015-03-09 ENCOUNTER — Encounter: Payer: Self-pay | Admitting: *Deleted

## 2015-03-09 NOTE — Progress Notes (Signed)
Patient ID: [name], female   DOB: 26-Feb-1935, 80 y.o.   MRN: [id number]   TERYN [name] is a 80 y.o. [name] female with a history of CAD, CABG 2011, HTN, HLD, HA, fibromyalgia, IBS, and GERD  She was admitted 10/29-11/02 with HA due to hypertensive emergency (SBP > 220), and hyponatremia (Na + 119). HCTZ was stopped and her sodium improved. She may have been taking an OTC medication with pseudoephedrine.   She was seen in the ER on 11/04 with HTN, SBP > 210. Discharged from the ER.  Seen by Dr. Lenna Gilford 11/05, BP meds increased with addition of hydralazine 25 mg BID, increased Losartan 50 mg-->100 mg daily.  ED visit 11/09 with HA, BP 212/92, MD suggested she stagger meds for better 24 hour coverage, no med changes made.   She was seen in hospital 11/15  With r/o normal lexiscan myovue and echo with normal EF  BP meds adjusted  Not on diuretic due to hyponatremia  Started on amlodipine November 2016  She seems more anxious with labile emotions  Sees Lenna Gilford as primary and Rosina Lowenstein neuro for headaches.  Has had them ? A year  When she gets them Makes her BP labile   She is intolerant to statins and refuses to try new ones   Had injection neck for headaches and is seeing PT  ROS: Denies fever, malais, weight loss, blurry vision, decreased visual acuity, cough, sputum, SOB, hemoptysis, pleuritic pain, palpitaitons, heartburn, abdominal pain, melena, lower extremity edema, claudication, or rash.  All other systems reviewed and negative  General: Affect appropriate Healthy:  appears stated age 80: normal Neck supple with no adenopathy JVP normal no bruits no thyromegaly Lungs clear with no wheezing and good diaphragmatic motion Heart:  S1/S2 no murmur, no rub, gallop or click PMI normal Abdomen: benighn, BS positve, no tenderness, no AAA no bruit.  No HSM or HJR Distal pulses intact with no bruits No edema Neuro non-focal Skin warm and dry No muscular  weakness   Current Outpatient Prescriptions  Medication Sig Dispense Refill  . Acetaminophen (TYLENOL 8 HOUR PO) Take by mouth as directed.     [name] amLODipine (NORVASC) 5 MG tablet Take 1 tablet (5 mg total) by mouth daily. 30 tablet 6  . aspirin EC 81 MG tablet Take 81 mg by mouth daily.    . clobetasol (TEMOVATE) 0.05 % external solution Apply 1 application topically 2 (two) times daily.    . fexofenadine (ALLEGRA) 180 MG tablet Take 180 mg by mouth daily.      . fluocinonide (LIDEX) 0.05 % external solution Apply 1 application topically daily as needed (scalp itching).     . Glucosamine-Chondroit-Vit C-Mn (GLUCOSAMINE CHONDR 1500 COMPLX PO) Take 1 tablet by mouth 3 (three) times daily.      . hydrALAZINE (APRESOLINE) 50 MG tablet Take 0.5 tablets (25 mg total) by mouth 3 (three) times daily. 45 tablet 2  . hyoscyamine (ANASPAZ) 0.125 MG TBDP disintergrating tablet Place 0.125 mg under the tongue every 6 (six) hours as needed for bladder spasms or cramping.    [name] ketoconazole (NIZORAL) 2 % shampoo Apply 1 application topically 2 (two) times a week. As needed for itching a couple of times week    . latanoprost (XALATAN) 0.005 % ophthalmic solution Place 1 drop into both eyes at bedtime.     [name] letrozole (FEMARA) 2.5 MG tablet Take 1 tablet (2.5 mg total) by mouth daily. 90 tablet 3  .  losartan (COZAAR) 100 MG tablet take 1/2 tablet by mouth three times a day 45 tablet 5  . magnesium oxide (MAG-OX) 400 MG tablet Take 400 mg by mouth daily. Reported on 02/14/2015    . metoprolol (LOPRESSOR) 50 MG tablet Take 1 tablet (50 mg total) by mouth 3 (three) times daily. 270 tablet 2  . mometasone (NASONEX) 50 MCG/ACT nasal spray Place 2 sprays into the nose daily as needed (congestion).     . nitroGLYCERIN (NITROSTAT) 0.4 MG SL tablet Place 1 tablet (0.4 mg total) under the tongue every 5 (five) minutes as needed for chest pain. 25 tablet 3  . Omega-3 Fatty Acids (FISH OIL) 1200 MG CAPS Take 1,200 mg by  mouth daily. Reported on 02/14/2015    . omeprazole (PRILOSEC) 40 MG capsule take 1 capsule by mouth once daily 30 MINUTES BEFORE 1ST MEAL OF THE DAY 30 capsule 4  . ranitidine (ZANTAC) 300 MG capsule Take 1 capsule (300 mg total) by mouth every evening. 30 capsule 3  . traMADol (ULTRAM) 50 MG tablet Take 50 mg by mouth as directed.    . vitamin B-12 (CYANOCOBALAMIN) 1000 MCG tablet Take 1,000 mcg by mouth daily.       No current facility-administered medications for this visit.    Allergies  Choline fenofibrate; Simvastatin; Hctz; Hydrocodone; Levaquin; Adhesive; Ceclor; Clarithromycin; Codeine; Doxycycline; Lisinopril; Penicillins; and Tobramycin-dexamethasone  Electrocardiogram:  SR rate 64  Normal LAE 01/14/14   Assessment and Plan CAD/CABG: non ishcemic myovue 2015  She will call if has to take nitro continue ASA/Beta blocker  HTN:  Improved on amlodipine   Breast Cancer   F/U Magrinat No BP's on left arm  mamogram last year ok  Chol:   Lab Results  Component Value Date   LDLCALC 110* 04/06/2012   Labs with Dr Ree Kida to statin    Headaches:  Don't think these related to headache  Seen by neurology cervicogenic ? Nerve block injection x1 continue PT gabapentin has been d/c   F/U me 6 months  Jenkins Rouge

## 2015-03-10 ENCOUNTER — Ambulatory Visit (INDEPENDENT_AMBULATORY_CARE_PROVIDER_SITE_OTHER): Payer: Medicare Other | Admitting: Cardiovascular Disease

## 2015-03-10 ENCOUNTER — Encounter: Payer: Self-pay | Admitting: Cardiovascular Disease

## 2015-03-10 VITALS — BP 150/80 | HR 67 | Ht <= 58 in | Wt 135.1 lb

## 2015-03-10 DIAGNOSIS — I10 Essential (primary) hypertension: Secondary | ICD-10-CM

## 2015-03-10 DIAGNOSIS — I2510 Atherosclerotic heart disease of native coronary artery without angina pectoris: Secondary | ICD-10-CM

## 2015-03-10 DIAGNOSIS — I2583 Coronary atherosclerosis due to lipid rich plaque: Principal | ICD-10-CM

## 2015-03-10 NOTE — Patient Instructions (Signed)

## 2015-03-15 ENCOUNTER — Ambulatory Visit: Payer: Medicare Other | Admitting: Physical Therapy

## 2015-03-15 VITALS — BP 130/72 | HR 62

## 2015-03-15 DIAGNOSIS — M5382 Other specified dorsopathies, cervical region: Secondary | ICD-10-CM

## 2015-03-15 DIAGNOSIS — J3089 Other allergic rhinitis: Secondary | ICD-10-CM | POA: Diagnosis not present

## 2015-03-15 DIAGNOSIS — J301 Allergic rhinitis due to pollen: Secondary | ICD-10-CM | POA: Diagnosis not present

## 2015-03-15 DIAGNOSIS — M542 Cervicalgia: Secondary | ICD-10-CM

## 2015-03-15 DIAGNOSIS — M6248 Contracture of muscle, other site: Secondary | ICD-10-CM | POA: Diagnosis not present

## 2015-03-15 DIAGNOSIS — R293 Abnormal posture: Secondary | ICD-10-CM

## 2015-03-15 DIAGNOSIS — M62838 Other muscle spasm: Secondary | ICD-10-CM

## 2015-03-15 NOTE — Therapy (Signed)
[place], [place], [address] Phone: [phone]   Fax:  [phone]  Physical Therapy Treatment  Patient Details  Name: [name] MRN: [id number] Date of Birth: 03/28/1935 Referring Provider: Cameron Sprang  Encounter Date: 03/15/2015      PT End of Session - 03/15/15 0935    Visit Number 4   Number of Visits 6   Date for PT Re-Evaluation 04/04/15   PT Start Time 0930   PT Stop Time 1030   PT Time Calculation (min) 60 min   Activity Tolerance Patient tolerated treatment well   Behavior During Therapy [facility] for tasks assessed/performed      Past Medical History  Diagnosis Date  . Diverticulosis of colon (without mention of hemorrhage)   . Irritable bowel syndrome   . Colonic polyp 04-27-2009    tubular adenoma  . Headache(784.0)     a. frequently assocaited with high BPs  . Breast cancer, Left 12/20/2010    NO BLOOD PRESSURE CHECKS OR STICKS IN LEFT ARM  . Gallstones   . CAD (coronary artery disease)     a. 01/19/2010 s/p CABG x 3, lima->lad, vg->diag, vg->om1;  b. 06/2011 :Lexi MV: EF 84%, No ischemia. c. 01/06/14 s/p negative nuclear stress test with EF >70%  . Fibromyalgia   . Atrophic vaginitis   . GERD (gastroesophageal reflux disease)   . Hypercholesterolemia   . Glaucoma   . Cervical arthritis ([place])   . Hiatal hernia   . Labile hypertension   . Asthma     Past Surgical History  Procedure Laterality Date  . Coronary angioplasty with stent placement      Stent 2007  . Open heart surgery      01/19/2010  . Coronary artery bypass graft    . Cataract extraction, bilateral      bilateral caaract removal,  . Breast lumpectomy Left 02/06/11    Filed Vitals:   03/15/15 0927  BP: 130/72  Pulse: 62  SpO2: 98%    Visit Diagnosis:  No diagnosis found.      Subjective Assessment - 03/15/15 0927    Subjective "I am having a little more of a headache today and my blood  pressure was up this morning at 179/70."   Currently in Pain? Yes   Pain Score 8   took tylenol before coming in today.    Pain Location Neck   Pain Orientation Right;Left;Mid                         [facility] Adult PT Treatment/Exercise - 03/15/15 1003    Neck Exercises: Supine   Neck Retraction 10 reps;10 secs  with towel beneath head to help with chin tuck   Shoulder ABduction 10 reps;Both  2 x 10 with yellow theraband   Upper Extremity D1 10 reps;Theraband  RUE only   Theraband Level (UE D1) Level 1 (Yellow)   Upper Extremity D2 10 reps;Theraband  RUE only   Theraband Level (UE D2) Level 1 (Yellow)   Other Supine Exercise retraction with ER 2 x 10 with yellow theraband  cueing to keep elbows tucked   Moist Heat Therapy   Number Minutes Moist Heat 10 Minutes   Moist Heat Location Cervical;Shoulder  in supine   Manual Therapy   Joint Mobilization grade 3 C6-T4 P>A mobs    Myofascial Release DTM over the sub-occipitals, R levator scap, and upper trap  Neck Exercises: Stretches   Upper Trapezius Stretch 2 reps;30 seconds   Levator Stretch 2 reps;30 seconds          Trigger Point Dry Needling - 03/15/15 1001    Consent Given? Yes   Education Handout Provided Yes   Muscles Treated Upper Body Suboccipitals muscle group;Upper trapezius;Levator scapulae   Upper Trapezius Response Twitch reponse elicited;Palpable increased muscle length   SubOccipitals Response Twitch response elicited;Palpable increased muscle length   Levator Scapulae Response Twitch response elicited;Palpable increased muscle length              PT Education - 03/15/15 1248    Education provided Yes   Education Details dry needling education   Person(s) Educated Patient   Methods Explanation   Comprehension Verbalized understanding          PT Short Term Goals - 03/15/15 1252    PT SHORT TERM GOAL #1   Title pt will be I with initial HEP  (03/14/2015)   Period Weeks    Status Achieved   PT SHORT TERM GOAL #2   Title pt will be able to verbalize and demonstrate techniques to reduce neck pain and headaches via postural awarenss, lifting and carrying mechanics and HEP (03/14/2015)   Time 3   Period Weeks   Status Achieved           PT Long Term Goals - 03/15/15 1252    PT LONG TERM GOAL #1   Title pt will be I with all HEP as of last visit ( 04/04/2015)   Time 6   Period Weeks   Status On-going   PT LONG TERM GOAL #2   Title pt will demonstrate reduced muscle spasm to promote improvement of cervical moblity and reduce pain to </=4/10 ( 04/04/2015)    Time 6   Period Weeks   Status On-going   PT LONG TERM GOAL #3   Title pt will increase her cervical moblity by >/=8 degrees with </= 4/10 pain  in all planes to assist with safety during driving ( 04/30/4654)    Time 6   Period Weeks   Status On-going   PT LONG TERM GOAL #4   Title pt will be able to lift and carry >/=10# at shoulder height or higher with </= 4/10 pain to assist with ADLS (09/25/2749)   Time 6   Period Weeks   Status On-going   PT LONG TERM GOAL #5   Title pt will improve her FOTO score to >/= 58 to demontrate improved function at discharge (04/04/2015)    Time 6   Period Weeks   Status On-going               Plan - 03/15/15 1249    Clinical Impression Statement [name] reported increased headache today at 8/10. following assessment of her vitials with them being in normal ranges she DN was perofomred on her  R upper trap, levator scap, and sub-occipitals which she reported relief following DTM. She completed given exercise requring Cueing on proper form but reported no pain or difficulty during or following activity. she met all STG this visit. Provided MHP for her neck and shoulder following todays session to decrease sorness from DN.    PT Next Visit Plan assess response to dry needling. modalities for pain, posture education, neck and shoulder stabilization strengthening   PT  Home Exercise Plan no new HEP provided.    Consulted and Agree with Plan of Care Patient  Problem List Patient Active Problem List   Diagnosis Date Noted  . Bilateral occipital neuralgia 11/23/2014  . Cervicogenic headache 11/23/2014  . Neck pain 11/23/2014  . OSA on CPAP 11/23/2014  . Sleep apnea 03/10/2014  . AP (abdominal pain) 02/07/2014  . History of diverticulitis 02/07/2014  . Generalized anxiety disorder 02/04/2014  . Labile hypertension   . Fibromyalgia   . Chest pain, midsternal 01/05/2014  . Cervical spondylolysis 12/24/2013  . CAD (coronary artery disease) 12/24/2013  . Hyponatremia 12/23/2013  . Hypertensive emergency 12/23/2013  . Acute encephalopathy 12/23/2013  . Bronchitis, chronic obstructive w acute bronchitis ([place]) 12/21/2013  . Diverticulosis of colon without hemorrhage 11/15/2013  . Hot flashes related to aromatase inhibitor therapy 07/27/2013  . Osteopenia 07/27/2013  . DJD (degenerative joint disease) 09/29/2012  . Breast cancer, Left 12/20/2010  . Leg pain 11/28/2010  . FATTY LIVER DISEASE 01/09/2010  . GASTRITIS 12/16/2007  . Allergic rhinitis 02/18/2007  . DIZZINESS, CHRONIC 02/18/2007  . Headache in back of head 02/18/2007  . Mixed hyperlipidemia 12/23/2006  . GLAUCOMA 12/23/2006  . Coronary atherosclerosis 12/23/2006  . Asthma 12/23/2006  . GERD 12/23/2006  . IRRITABLE BOWEL SYNDROME 12/23/2006  . Myalgia and myositis 12/23/2006   Starr Lake PT, DPT, LAT, ATC  03/15/2015  12:53 PM     [facility] [address] [place], [place], [address] Phone: [phone]   Fax:  [phone]  Name: [name] MRN: [id number] Date of Birth: 10-06-1935

## 2015-03-21 DIAGNOSIS — M542 Cervicalgia: Secondary | ICD-10-CM | POA: Diagnosis not present

## 2015-03-21 DIAGNOSIS — M797 Fibromyalgia: Secondary | ICD-10-CM | POA: Diagnosis not present

## 2015-03-21 DIAGNOSIS — R51 Headache: Secondary | ICD-10-CM | POA: Diagnosis not present

## 2015-03-21 DIAGNOSIS — M9901 Segmental and somatic dysfunction of cervical region: Secondary | ICD-10-CM | POA: Diagnosis not present

## 2015-03-22 ENCOUNTER — Ambulatory Visit: Payer: Medicare Other | Admitting: Physical Therapy

## 2015-03-22 DIAGNOSIS — M542 Cervicalgia: Secondary | ICD-10-CM | POA: Diagnosis not present

## 2015-03-22 DIAGNOSIS — M5382 Other specified dorsopathies, cervical region: Secondary | ICD-10-CM

## 2015-03-22 DIAGNOSIS — M62838 Other muscle spasm: Secondary | ICD-10-CM

## 2015-03-22 DIAGNOSIS — R293 Abnormal posture: Secondary | ICD-10-CM

## 2015-03-22 DIAGNOSIS — M6248 Contracture of muscle, other site: Secondary | ICD-10-CM | POA: Diagnosis not present

## 2015-03-22 MED FILL — LETROZOLE 2.5 MG TABLET: 2.5 | 90 days supply | Qty: 90 | Fill #2

## 2015-03-22 NOTE — Patient Instructions (Addendum)

## 2015-03-22 NOTE — Therapy (Signed)
[place], [place], [address] Phone: [phone]   Fax:  [phone]  Physical Therapy Treatment  Patient Details  Name: [name] MRN: [id number] Date of Birth: December 25, 1935 Referring Provider: Cameron Sprang  Encounter Date: 03/22/2015      PT End of Session - 03/22/15 1110    Visit Number 5   Number of Visits 6   Date for PT Re-Evaluation 04/04/15   PT Start Time 0932   PT Stop Time 1017   PT Time Calculation (min) 45 min   Activity Tolerance Patient tolerated treatment well   Behavior During Therapy [facility] for tasks assessed/performed      Past Medical History  Diagnosis Date  . Diverticulosis of colon (without mention of hemorrhage)   . Irritable bowel syndrome   . Colonic polyp 04-27-2009    tubular adenoma  . Headache(784.0)     a. frequently assocaited with high BPs  . Breast cancer, Left 12/20/2010    NO BLOOD PRESSURE CHECKS OR STICKS IN LEFT ARM  . Gallstones   . CAD (coronary artery disease)     a. 01/19/2010 s/p CABG x 3, lima->lad, vg->diag, vg->om1;  b. 06/2011 :Lexi MV: EF 84%, No ischemia. c. 01/06/14 s/p negative nuclear stress test with EF >70%  . Fibromyalgia   . Atrophic vaginitis   . GERD (gastroesophageal reflux disease)   . Hypercholesterolemia   . Glaucoma   . Cervical arthritis ([place])   . Hiatal hernia   . Labile hypertension   . Asthma     Past Surgical History  Procedure Laterality Date  . Coronary angioplasty with stent placement      Stent 2007  . Open heart surgery      01/19/2010  . Coronary artery bypass graft    . Cataract extraction, bilateral      bilateral caaract removal,  . Breast lumpectomy Left 02/06/11    There were no vitals filed for this visit.  Visit Diagnosis:  Neck pain  Muscle spasms of neck  Decreased ROM of intervertebral discs of cervical spine  Abnormal posture      Subjective Assessment - 03/22/15 0935    Subjective  Pt was asked how she felt after DN and she reported that it took until Saturday for the soreness to go away, especially in the one spot (pt pointed to levator) and radiated into the neck. Pt also reported that moving her neck was a little easier after the DN. Pt stated that her R shoulder "is real tired and it goes down the arm with my hand and fingers tingling." When asked about her BP pt stated "BP has been good for me" .    Currently in Pain? Yes   Pain Score 5    Pain Location Head   Pain Orientation --  base   Pain Descriptors / Indicators Aching;Shooting   Pain Type Chronic pain                         [facility] Adult PT Treatment/Exercise - 03/22/15 0942    Neck Exercises: Machines for Strengthening   UBE (Upper Arm Bike) L1.0 x 6 min (3 min forward, 3 min back)   Neck Exercises: Seated   Other Seated Exercise Scapular retraction; hold 10 sec   Neck Exercises: Supine   Neck Retraction 10 reps;10 secs   Cervical Rotation Both;10 reps   Cervical Rotation Limitations hold 10 secs  Other Supine Exercise serratus ant. punches 2 x 10, 2#   Other Supine Exercise retraction with ER, red theraband 2 x 10   Manual Therapy   Manual Therapy Soft tissue mobilization   Soft tissue mobilization Levator scap cross friction massage; suboccipital release (1 min)                PT Education - 03/22/15 1109    Education provided Yes   Education Details posture education (focus on stand, sit, and chores)   Person(s) Educated Patient   Methods Explanation;Handout   Comprehension Verbalized understanding;Returned demonstration          PT Short Term Goals - 03/15/15 1252    PT SHORT TERM GOAL #1   Title pt will be I with initial HEP  (03/14/2015)   Period Weeks   Status Achieved   PT SHORT TERM GOAL #2   Title pt will be able to verbalize and demonstrate techniques to reduce neck pain and headaches via postural awarenss, lifting and carrying mechanics and HEP  (03/14/2015)   Time 3   Period Weeks   Status Achieved           PT Long Term Goals - 03/15/15 1252    PT LONG TERM GOAL #1   Title pt will be I with all HEP as of last visit ( 04/04/2015)   Time 6   Period Weeks   Status On-going   PT LONG TERM GOAL #2   Title pt will demonstrate reduced muscle spasm to promote improvement of cervical moblity and reduce pain to </=4/10 ( 04/04/2015)    Time 6   Period Weeks   Status On-going   PT LONG TERM GOAL #3   Title pt will increase her cervical moblity by >/=8 degrees with </= 4/10 pain  in all planes to assist with safety during driving ( [id number])    Time 6   Period Weeks   Status On-going   PT LONG TERM GOAL #4   Title pt will be able to lift and carry >/=10# at shoulder height or higher with </= 4/10 pain to assist with ADLS ([id number])   Time 6   Period Weeks   Status On-going   PT LONG TERM GOAL #5   Title pt will improve her FOTO score to >/= 58 to demontrate improved function at discharge (04/04/2015)    Time 6   Period Weeks   Status On-going               Plan - 03/22/15 1111    Clinical Impression Statement Pt reported head pain that was 5/10 and shoulder "tiredness". Pt felt some relief from DN with soreness into the weekend, though pt seems hesitant towards having it again if needed. Continued tighness in the levator scapulae and suboccipitals. Pt tolerated all shoulder and cervical exercises well without an increase in pain. Provided education about posture with a handout in order to prevent reinjury and help with muscle pain.     PT Next Visit Plan neck and shoulder stabilization strengthening, progress HEP, modalities PRN   Consulted and Agree with Plan of Care Patient        Problem List Patient Active Problem List   Diagnosis Date Noted  . Bilateral occipital neuralgia 11/23/2014  . Cervicogenic headache 11/23/2014  . Neck pain 11/23/2014  . OSA on CPAP 11/23/2014  . Sleep apnea 03/10/2014  . AP  (abdominal pain) 02/07/2014  . History of diverticulitis 02/07/2014  .  Generalized anxiety disorder 02/04/2014  . Labile hypertension   . Fibromyalgia   . Chest pain, midsternal 01/05/2014  . Cervical spondylolysis 12/24/2013  . CAD (coronary artery disease) 12/24/2013  . Hyponatremia 12/23/2013  . Hypertensive emergency 12/23/2013  . Acute encephalopathy 12/23/2013  . Bronchitis, chronic obstructive w acute bronchitis ([place]) 12/21/2013  . Diverticulosis of colon without hemorrhage 11/15/2013  . Hot flashes related to aromatase inhibitor therapy 07/27/2013  . Osteopenia 07/27/2013  . DJD (degenerative joint disease) 09/29/2012  . Breast cancer, Left 12/20/2010  . Leg pain 11/28/2010  . FATTY LIVER DISEASE 01/09/2010  . GASTRITIS 12/16/2007  . Allergic rhinitis 02/18/2007  . DIZZINESS, CHRONIC 02/18/2007  . Headache in back of head 02/18/2007  . Mixed hyperlipidemia 12/23/2006  . GLAUCOMA 12/23/2006  . Coronary atherosclerosis 12/23/2006  . Asthma 12/23/2006  . GERD 12/23/2006  . IRRITABLE BOWEL SYNDROME 12/23/2006  . Myalgia and myositis 12/23/2006     Ammie Ferrier, SPT 03/22/2015  12:40 PM  [facility] [address] [place], [place], [address] Phone: [phone]   Fax:  [phone]  Name: [name] MRN: [id number] Date of Birth: [birth year]

## 2015-03-27 ENCOUNTER — Ambulatory Visit: Payer: Medicare Other | Admitting: Cardiovascular Disease

## 2015-03-29 ENCOUNTER — Ambulatory Visit: Payer: Medicare Other | Attending: Neurology | Admitting: Physical Therapy

## 2015-03-29 ENCOUNTER — Ambulatory Visit: Payer: Medicare Other | Admitting: Cardiovascular Disease

## 2015-03-29 DIAGNOSIS — R293 Abnormal posture: Secondary | ICD-10-CM | POA: Insufficient documentation

## 2015-03-29 DIAGNOSIS — M62838 Other muscle spasm: Secondary | ICD-10-CM

## 2015-03-29 DIAGNOSIS — M5382 Other specified dorsopathies, cervical region: Secondary | ICD-10-CM | POA: Insufficient documentation

## 2015-03-29 DIAGNOSIS — J301 Allergic rhinitis due to pollen: Secondary | ICD-10-CM | POA: Diagnosis not present

## 2015-03-29 DIAGNOSIS — M6248 Contracture of muscle, other site: Secondary | ICD-10-CM | POA: Diagnosis not present

## 2015-03-29 DIAGNOSIS — M542 Cervicalgia: Secondary | ICD-10-CM | POA: Insufficient documentation

## 2015-03-29 DIAGNOSIS — J3089 Other allergic rhinitis: Secondary | ICD-10-CM | POA: Diagnosis not present

## 2015-03-29 NOTE — Therapy (Signed)
[place], [place], [address] Phone: [phone]   Fax:  [phone]  Physical Therapy Treatment  Patient Details  Name: [name] MRN: [id number] Date of Birth: June 17, 1935 Referring Provider: Cameron Sprang  Encounter Date: 03/29/2015      PT End of Session - 03/29/15 1152    Visit Number 6   Number of Visits 8   Date for PT Re-Evaluation 04/04/15   PT Start Time 1018   PT Stop Time 1110   PT Time Calculation (min) 52 min   Activity Tolerance Patient tolerated treatment well   Behavior During Therapy [facility] for tasks assessed/performed      Past Medical History  Diagnosis Date  . Diverticulosis of colon (without mention of hemorrhage)   . Irritable bowel syndrome   . Colonic polyp 04-27-2009    tubular adenoma  . Headache(784.0)     a. frequently assocaited with high BPs  . Breast cancer, Left 12/20/2010    NO BLOOD PRESSURE CHECKS OR STICKS IN LEFT ARM  . Gallstones   . CAD (coronary artery disease)     a. 01/19/2010 s/p CABG x 3, lima->lad, vg->diag, vg->om1;  b. 06/2011 :Lexi MV: EF 84%, No ischemia. c. 01/06/14 s/p negative nuclear stress test with EF >70%  . Fibromyalgia   . Atrophic vaginitis   . GERD (gastroesophageal reflux disease)   . Hypercholesterolemia   . Glaucoma   . Cervical arthritis ([place])   . Hiatal hernia   . Labile hypertension   . Asthma     Past Surgical History  Procedure Laterality Date  . Coronary angioplasty with stent placement      Stent 2007  . Open heart surgery      01/19/2010  . Coronary artery bypass graft    . Cataract extraction, bilateral      bilateral caaract removal,  . Breast lumpectomy Left 02/06/11    There were no vitals filed for this visit.  Visit Diagnosis:  Neck pain  Muscle spasms of neck  Decreased ROM of intervertebral discs of cervical spine  Abnormal posture      Subjective Assessment - 03/29/15 1024    Subjective  between shoulder blades 4-5/10  PT has helped neck and shoulder pain.     Currently in Pain? Yes   Pain Score 5    Pain Location Back   Pain Orientation Posterior;Mid   Pain Descriptors / Indicators Sore   Pain Radiating Towards RT arm pressing, not right    Pain Frequency Intermittent   Aggravating Factors  some exercises,    Pain Relieving Factors meds heat ice.                           [facility] Adult PT Treatment/Exercise - 03/29/15 1027    Self-Care   Self-Care --  sitting posture practiced and reeviewed.     Posture pATIENT DOES NOT HAVE A FOOT STOOL fior home.  Practiced sitting with lumbar support,  practiced sidelying and supine with neck roll.  Standing with abdominal bracing practiced.     Neck Exercises: Machines for Strengthening   UBE (Upper Arm Bike) L1.1 3 each way,  large bolster for foot rest   Neck Exercises: Standing   Neck Retraction --  5 X, cues   Neck Retraction Limitations scapular retraction 10 X, no bands to assist posture.     Other Standing Exercises rows 10 X with  cues, red band .  shoulder extension with red band 10 X with multiple cues.     Neck Exercises: Seated   Cervical Isometrics 3 secs   Neck Retraction 5 reps  cues   Neck Exercises: Supine   Cervical Isometrics Flexion;Extension;5 secs;5 reps   Neck Retraction 5 reps;5 secs  cues   Other Supine Exercise decompression with rolled towel along spine, 2 pillows along Rt arm for support.    Manual Therapy   Manual therapy comments RT upper trap brief trigger point relase with deconpression exercises.     Neck Exercises: Stretches   Upper Trapezius Stretch 1 rep   Levator Stretch 2 reps                PT Education - 03/29/15 1106    Education provided Yes   Education Details posture ed, sit, stand, supine, sidelying.  SCAPULAR RETRACTION   Methods Explanation;Demonstration;Verbal cues;Handout   Comprehension Verbalized understanding;Returned demonstration           PT Short Term Goals - 03/15/15 1252    PT SHORT TERM GOAL #1   Title pt will be I with initial HEP  (03/14/2015)   Period Weeks   Status Achieved   PT SHORT TERM GOAL #2   Title pt will be able to verbalize and demonstrate techniques to reduce neck pain and headaches via postural awarenss, lifting and carrying mechanics and HEP (03/14/2015)   Time 3   Period Weeks   Status Achieved           PT Long Term Goals - 03/29/15 1156    PT LONG TERM GOAL #1   Title pt will be I with all HEP as of last visit ( 04/04/2015)   Baseline cues needed   Time 6   Period Weeks   Status On-going   PT LONG TERM GOAL #2   Title pt will demonstrate reduced muscle spasm to promote improvement of cervical moblity and reduce pain to </=4/10 ( 04/04/2015)    Time 6   Period Weeks   Status On-going   PT LONG TERM GOAL #3   Title pt will increase her cervical moblity by >/=8 degrees with </= 4/10 pain  in all planes to assist with safety during driving ( [id number])    Time 6   Period Weeks   Status Unable to assess   PT LONG TERM GOAL #4   Title pt will be able to lift and carry >/=10# at shoulder height or higher with </= 4/10 pain to assist with ADLS ([id number])   Time 6   Period Weeks   Status Unable to assess   PT LONG TERM GOAL #5   Title pt will improve her FOTO score to >/= 58 to demontrate improved function at discharge (04/04/2015)    Time 6   Period Weeks   Status Unable to assess               Plan - 03/29/15 1153    Clinical Impression Statement Patient has questions about techniques for home exercises  and posture deficets so both were reviewed.  Patient now more awaer of how posture really affects body.  Pain today was no longer in her neck but focused mid thoracic.    Progress toward pain goals  and ADL goals.    PT Next Visit Plan neck and shoulder stabilization strengthening, progress HEP, modalities PRN One more visit?    PT Home Exercise Plan scapular retraction  Consulted  and Agree with Plan of Care Patient        Problem List Patient Active Problem List   Diagnosis Date Noted  . Bilateral occipital neuralgia 11/23/2014  . Cervicogenic headache 11/23/2014  . Neck pain 11/23/2014  . OSA on CPAP 11/23/2014  . Sleep apnea 03/10/2014  . AP (abdominal pain) 02/07/2014  . History of diverticulitis 02/07/2014  . Generalized anxiety disorder 02/04/2014  . Labile hypertension   . Fibromyalgia   . Chest pain, midsternal 01/05/2014  . Cervical spondylolysis 12/24/2013  . CAD (coronary artery disease) 12/24/2013  . Hyponatremia 12/23/2013  . Hypertensive emergency 12/23/2013  . Acute encephalopathy 12/23/2013  . Bronchitis, chronic obstructive w acute bronchitis ([place]) 12/21/2013  . Diverticulosis of colon without hemorrhage 11/15/2013  . Hot flashes related to aromatase inhibitor therapy 07/27/2013  . Osteopenia 07/27/2013  . DJD (degenerative joint disease) 09/29/2012  . Breast cancer, Left 12/20/2010  . Leg pain 11/28/2010  . FATTY LIVER DISEASE 01/09/2010  . GASTRITIS 12/16/2007  . Allergic rhinitis 02/18/2007  . DIZZINESS, CHRONIC 02/18/2007  . Headache in back of head 02/18/2007  . Mixed hyperlipidemia 12/23/2006  . GLAUCOMA 12/23/2006  . Coronary atherosclerosis 12/23/2006  . Asthma 12/23/2006  . GERD 12/23/2006  . IRRITABLE BOWEL SYNDROME 12/23/2006  . Myalgia and myositis 12/23/2006    [facility] 03/29/2015, 11:58 AM  [facility] [address] [place], [place], [address] Phone: [phone]   Fax:  [phone]  Name: [name] MRN: [id number] Date of Birth: 06/29/1935    Melvenia Needles, PTA 03/29/2015 11:58 AM Phone: [phone] Fax: [phone]

## 2015-03-29 NOTE — Patient Instructions (Signed)
Shoulder (Scapula) Retraction    Pull shoulders back, squeezing shoulder blades together. Repeat _5 to 10 ___ times per session. Do _1 to 2 or more___ sessions per day.  This should feel good Position: Standing   Copyright  VHI. All rights reserved.

## 2015-04-03 DIAGNOSIS — J301 Allergic rhinitis due to pollen: Secondary | ICD-10-CM | POA: Diagnosis not present

## 2015-04-03 DIAGNOSIS — J3089 Other allergic rhinitis: Secondary | ICD-10-CM | POA: Diagnosis not present

## 2015-04-04 DIAGNOSIS — M542 Cervicalgia: Secondary | ICD-10-CM | POA: Diagnosis not present

## 2015-04-04 DIAGNOSIS — M545 Low back pain: Secondary | ICD-10-CM | POA: Diagnosis not present

## 2015-04-04 DIAGNOSIS — M9901 Segmental and somatic dysfunction of cervical region: Secondary | ICD-10-CM | POA: Diagnosis not present

## 2015-04-04 DIAGNOSIS — M9903 Segmental and somatic dysfunction of lumbar region: Secondary | ICD-10-CM | POA: Diagnosis not present

## 2015-04-05 ENCOUNTER — Encounter: Payer: Medicare Other | Admitting: Physical Therapy

## 2015-04-06 ENCOUNTER — Ambulatory Visit: Payer: Medicare Other | Admitting: Physical Therapy

## 2015-04-11 ENCOUNTER — Ambulatory Visit: Payer: Medicare Other | Admitting: Physical Therapy

## 2015-04-11 DIAGNOSIS — M542 Cervicalgia: Secondary | ICD-10-CM

## 2015-04-11 DIAGNOSIS — M62838 Other muscle spasm: Secondary | ICD-10-CM

## 2015-04-11 DIAGNOSIS — M5382 Other specified dorsopathies, cervical region: Secondary | ICD-10-CM

## 2015-04-11 DIAGNOSIS — R293 Abnormal posture: Secondary | ICD-10-CM | POA: Diagnosis not present

## 2015-04-11 DIAGNOSIS — J301 Allergic rhinitis due to pollen: Secondary | ICD-10-CM | POA: Diagnosis not present

## 2015-04-11 DIAGNOSIS — M6248 Contracture of muscle, other site: Secondary | ICD-10-CM | POA: Diagnosis not present

## 2015-04-11 DIAGNOSIS — J3089 Other allergic rhinitis: Secondary | ICD-10-CM | POA: Diagnosis not present

## 2015-04-11 NOTE — Patient Instructions (Signed)
Can do this in standing or sitting if needed.   Over Head Pull: Narrow Grip       On back, knees bent, feet flat, band across thighs, elbows straight but relaxed. Pull hands apart (start). Keeping elbows straight, bring arms up and over head, hands toward floor. Keep pull steady on band. Hold momentarily. Return slowly, keeping pull steady, back to start. Repeat _10__ times. Band color ___Red___   Side Pull: Double Arm   On back, knees bent, feet flat. Arms perpendicular to body, shoulder level, elbows straight but relaxed. Pull arms out to sides, elbows straight. Resistance band comes across collarbones, hands toward floor. Hold momentarily. Slowly return to starting position. Repeat _10__ times. Band color ___RED__      Shoulder Rotation: Double Arm   On back, knees bent, feet flat, elbows tucked at sides, bent 90, hands palms up. Pull hands apart and down toward floor, keeping elbows near sides. Hold momentarily. Slowly return to starting position. Repeat _10__ times. Band color ____RED__

## 2015-04-11 NOTE — Therapy (Signed)
[place], [place], [address] Phone: [phone]   Fax:  [phone]  Physical Therapy Treatment  Patient Details  Name: [name] MRN: [id number] Date of Birth: 10-Dec-1935 Referring Provider: Cameron Sprang  Encounter Date: 04/11/2015      PT End of Session - 04/11/15 1016    Visit Number 7   Number of Visits 11   Date for PT Re-Evaluation 05/09/15   PT Start Time 3419   PT Stop Time 1114   PT Time Calculation (min) 60 min   Activity Tolerance Patient tolerated treatment well   Behavior During Therapy [facility] for tasks assessed/performed      Past Medical History  Diagnosis Date  . Diverticulosis of colon (without mention of hemorrhage)   . Irritable bowel syndrome   . Colonic polyp 04-27-2009    tubular adenoma  . Headache(784.0)     a. frequently assocaited with high BPs  . Breast cancer, Left 12/20/2010    NO BLOOD PRESSURE CHECKS OR STICKS IN LEFT ARM  . Gallstones   . CAD (coronary artery disease)     a. 01/19/2010 s/p CABG x 3, lima->lad, vg->diag, vg->om1;  b. 06/2011 :Lexi MV: EF 84%, No ischemia. c. 01/06/14 s/p negative nuclear stress test with EF >70%  . Fibromyalgia   . Atrophic vaginitis   . GERD (gastroesophageal reflux disease)   . Hypercholesterolemia   . Glaucoma   . Cervical arthritis ([place])   . Hiatal hernia   . Labile hypertension   . Asthma     Past Surgical History  Procedure Laterality Date  . Coronary angioplasty with stent placement      Stent 2007  . Open heart surgery      01/19/2010  . Coronary artery bypass graft    . Cataract extraction, bilateral      bilateral caaract removal,  . Breast lumpectomy Left 02/06/11    There were no vitals filed for this visit.  Visit Diagnosis:  Neck pain  Muscle spasms of neck  Decreased ROM of intervertebral discs of cervical spine  Abnormal posture      Subjective Assessment - 04/11/15 1016     Subjective Shoulder blade squeeze really helped.  Pt feels better but noticed tightness in Rt. scapula and neck.  Headaches are better.     Currently in Pain? Yes   Pain Score 2    Pain Location Neck   Pain Orientation Posterior   Pain Descriptors / Indicators Tightness   Pain Type Chronic pain   Pain Onset More than a month ago   Pain Frequency Intermittent   Aggravating Factors  self limits lifting, sitting too long    Pain Relieving Factors stretching, heat            [facility] PT Assessment - 04/11/15 1022    AROM   Cervical Flexion 35  mid cervical 65    Cervical Extension 30   Cervical - Right Side Bend 30   Cervical - Left Side Bend 30   Cervical - Right Rotation 70   Cervical - Left Rotation 65            PT Education - 04/11/15 1057    Education provided Yes   Education Details scap stab ex HEP, posture , progress   Person(s) Educated Patient   Methods Demonstration;Verbal cues;Tactile cues;Explanation;Handout   Comprehension Returned demonstration;Verbalized understanding;Need further instruction          PT Short  Term Goals - 04/13/2015 1039    PT SHORT TERM GOAL #1   Title pt will be I with initial HEP  (03/14/2015)   Status Achieved   PT SHORT TERM GOAL #2   Title pt will be able to verbalize and demonstrate techniques to reduce neck pain and headaches via postural awarenss, lifting and carrying mechanics and HEP (03/14/2015)   Status Achieved           PT Long Term Goals - April 13, 2015 1039    PT LONG TERM GOAL #1   Title pt will be I with all HEP as of last visit ( 04/04/2015)   Status On-going   PT LONG TERM GOAL #2   Title pt will demonstrate reduced muscle spasm to promote improvement of cervical moblity and reduce pain to </=4/10 ( 04/04/2015)    Baseline 5/10   Status Partially Met   PT LONG TERM GOAL #3   Title pt will increase her cervical moblity by >/=8 degrees with </= 4/10 pain  in all planes to assist with safety during driving ( 05/28/3293)     Baseline met in rotation    Status Partially Met   PT LONG TERM GOAL #4   Title pt will be able to lift and carry >/=10# at shoulder height or higher with </= 4/10 pain to assist with ADLS (03/04/8414)   Status On-going   PT LONG TERM GOAL #5   Title pt will improve her FOTO score to >/= 58 to demontrate improved function at discharge (04/04/2015)    Baseline 47%   Status On-going               Plan - Apr 13, 2015 1033    Clinical Impression Statement Patient is improving in function and having less pain overall.  She cont to have increased muscle tension with prolonged periods of sitting and mild decreased in lateral flexion of C spine.  She will benefit from a few more visits to improve postural strength and reduce tension, pain in scapular mm  and neck.     Pt will benefit from skilled therapeutic intervention in order to improve on the following deficits Increased muscle spasms;Pain;Improper body mechanics;Postural dysfunction;Hypomobility;Decreased activity tolerance;Decreased endurance;Decreased range of motion   Rehab Potential Good   PT Frequency 1x / week   PT Duration 4 weeks   PT Treatment/Interventions ADLs/Self Care Home Management;Electrical Stimulation;Cryotherapy;Iontophoresis 32m/ml Dexamethasone;Moist Heat;Therapeutic exercise;Therapeutic activities;Taping;Manual techniques;Dry needling;Patient/family education;Neuromuscular re-education;Ultrasound   PT Next Visit Plan neck and shoulder stabilization strengthening, progress HEP, modalities PRN, dry needle if possible   PT Home Exercise Plan scapular retraction, gave scap stab today red band    Consulted and Agree with Plan of Care Patient          G-Codes - [id number]    Functional Assessment Tool Used FOTO   Functional Limitation Changing and maintaining body position   Changing and Maintaining Body Position Current Status ((S0630 At least 40 percent but less than 60 percent impaired, limited or restricted    Changing and Maintaining Body Position Goal Status ((Z6010 At least 40 percent but less than 60 percent impaired, limited or restricted      Problem List Patient Active Problem List   Diagnosis Date Noted  . Bilateral occipital neuralgia 11/23/2014  . Cervicogenic headache 11/23/2014  . Neck pain 11/23/2014  . OSA on CPAP 11/23/2014  . Sleep apnea 03/10/2014  . AP (abdominal pain) 02/07/2014  . History of diverticulitis 02/07/2014  . Generalized anxiety disorder  02/04/2014  . Labile hypertension   . Fibromyalgia   . Chest pain, midsternal 01/05/2014  . Cervical spondylolysis 12/24/2013  . CAD (coronary artery disease) 12/24/2013  . Hyponatremia 12/23/2013  . Hypertensive emergency 12/23/2013  . Acute encephalopathy 12/23/2013  . Bronchitis, chronic obstructive w acute bronchitis ([place]) 12/21/2013  . Diverticulosis of colon without hemorrhage 11/15/2013  . Hot flashes related to aromatase inhibitor therapy 07/27/2013  . Osteopenia 07/27/2013  . DJD (degenerative joint disease) 09/29/2012  . Breast cancer, Left 12/20/2010  . Leg pain 11/28/2010  . FATTY LIVER DISEASE 01/09/2010  . GASTRITIS 12/16/2007  . Allergic rhinitis 02/18/2007  . DIZZINESS, CHRONIC 02/18/2007  . Headache in back of head 02/18/2007  . Mixed hyperlipidemia 12/23/2006  . GLAUCOMA 12/23/2006  . Coronary atherosclerosis 12/23/2006  . Asthma 12/23/2006  . GERD 12/23/2006  . IRRITABLE BOWEL SYNDROME 12/23/2006  . Myalgia and myositis 12/23/2006    PAA,JENNIFER 04/11/2015, 11:15 AM  [place], [place], [address] Phone: [phone]   Fax:  [phone]  Name: [name] MRN: [id number] Date of Birth: [birth year]  Raeford Razor, PT 04/11/2015 11:15 AM Phone: [phone] Fax: [phone]

## 2015-04-11 NOTE — Addendum Note (Signed)
Addended by: Raeford Razor L on: 04/11/2015 11:31 AM   Modules accepted: Orders

## 2015-04-13 ENCOUNTER — Telehealth: Payer: Self-pay | Admitting: Neurology

## 2015-04-13 NOTE — Telephone Encounter (Signed)
Called patient back and notified her of advisement. 

## 2015-04-13 NOTE — Telephone Encounter (Signed)
I spoke with patient. She states that last night she started having tightness around her head, she states she also feels "swimmy" headed. States she wants to sleep a lot.

## 2015-04-13 NOTE — Telephone Encounter (Signed)
Would continue with physical therapy. If symptoms worsen, go to ER. Thanks

## 2015-04-13 NOTE — Telephone Encounter (Signed)
VM-PT left message that her head and neck are tight again and would like a call back/Dawn CB# [phone]

## 2015-04-17 ENCOUNTER — Ambulatory Visit: Payer: Medicare Other | Admitting: Physical Therapy

## 2015-04-17 VITALS — BP 140/78

## 2015-04-17 DIAGNOSIS — M5382 Other specified dorsopathies, cervical region: Secondary | ICD-10-CM

## 2015-04-17 DIAGNOSIS — M6248 Contracture of muscle, other site: Secondary | ICD-10-CM | POA: Diagnosis not present

## 2015-04-17 DIAGNOSIS — J301 Allergic rhinitis due to pollen: Secondary | ICD-10-CM | POA: Diagnosis not present

## 2015-04-17 DIAGNOSIS — J3089 Other allergic rhinitis: Secondary | ICD-10-CM | POA: Diagnosis not present

## 2015-04-17 DIAGNOSIS — R293 Abnormal posture: Secondary | ICD-10-CM | POA: Diagnosis not present

## 2015-04-17 DIAGNOSIS — M62838 Other muscle spasm: Secondary | ICD-10-CM

## 2015-04-17 DIAGNOSIS — M542 Cervicalgia: Secondary | ICD-10-CM

## 2015-04-17 NOTE — Therapy (Signed)
[place], [place], [address] Phone: [phone]   Fax:  [phone]  Physical Therapy Treatment  Patient Details  Name: [name] MRN: [id number] Date of Birth: 1936/01/20 Referring Provider: Cameron Sprang  Encounter Date: 04/17/2015      PT End of Session - 04/17/15 1250    Visit Number 8   Number of Visits 11   Date for PT Re-Evaluation 05/09/15   Authorization Type Medicare: kx mod by 15th visit, progress note by 10th visit   PT Start Time 1148   PT Stop Time 1242   PT Time Calculation (min) 54 min   Activity Tolerance Patient tolerated treatment well   Behavior During Therapy [facility] for tasks assessed/performed      Past Medical History  Diagnosis Date  . Diverticulosis of colon (without mention of hemorrhage)   . Irritable bowel syndrome   . Colonic polyp 04-27-2009    tubular adenoma  . Headache(784.0)     a. frequently assocaited with high BPs  . Breast cancer, Left 12/20/2010    NO BLOOD PRESSURE CHECKS OR STICKS IN LEFT ARM  . Gallstones   . CAD (coronary artery disease)     a. 01/19/2010 s/p CABG x 3, lima->lad, vg->diag, vg->om1;  b. 06/2011 :Lexi MV: EF 84%, No ischemia. c. 01/06/14 s/p negative nuclear stress test with EF >70%  . Fibromyalgia   . Atrophic vaginitis   . GERD (gastroesophageal reflux disease)   . Hypercholesterolemia   . Glaucoma   . Cervical arthritis ([place])   . Hiatal hernia   . Labile hypertension   . Asthma     Past Surgical History  Procedure Laterality Date  . Coronary angioplasty with stent placement      Stent 2007  . Open heart surgery      01/19/2010  . Coronary artery bypass graft    . Cataract extraction, bilateral      bilateral caaract removal,  . Breast lumpectomy Left 02/06/11    Filed Vitals:   04/17/15 1150  BP: 140/78    Visit Diagnosis:  Neck pain  Muscle spasms of neck  Decreased ROM of intervertebral discs of cervical  spine  Abnormal posture      Subjective Assessment - 04/17/15 1150    Subjective "I am having a bad day today, it has been acting funny since the last time I was here"    Currently in Pain? Yes   Pain Score 7    Pain Location Neck   Pain Orientation Left;Right   Pain Descriptors / Indicators Tightness   Pain Type Chronic pain   Pain Onset More than a month ago   Pain Frequency Intermittent   Aggravating Factors  possibly effected by blood pressure(per pt report)                         [facility] Adult PT Treatment/Exercise - 04/17/15 1256    Self-Care   Self-Care Other Self-Care Comments   Other Self-Care Comments  reviewed Stretches and how to perform them. educated that she should only feel stretch, if pain is felt she should back off until she only feels a stretch   Moist Heat Therapy   Number Minutes Moist Heat 10 Minutes   Moist Heat Location Shoulder  R upper trap in supine   Manual Therapy   Manual Therapy Other (comment)   Joint Mobilization Grade 3 T1-T7 P>A mobs  reported  inital sores at T4 and T7   Soft tissue mobilization soft tissue massage over the R upper trap, sub-occipital release   Myofascial Release Rolling / stretching of the R upper trap and Cervical multifidus   Other Manual Therapy contract relax of R upper trap with 10 sec hold x 3          Trigger Point Dry Needling - 04/17/15 1259    Consent Given? Yes   Education Handout Provided Yes  provided previously   Muscles Treated Upper Body Upper trapezius  cervical multifidus   Upper Trapezius Response Twitch reponse elicited;Palpable increased muscle length              PT Education - 04/17/15 1250    Education provided Yes   Education Details reviewed stretches for the upper trap and levator scapulae   Person(s) Educated Patient   Methods Explanation   Comprehension Verbalized understanding          PT Short Term Goals - 04/11/15 1039    PT SHORT TERM GOAL #1    Title pt will be I with initial HEP  (03/14/2015)   Status Achieved   PT SHORT TERM GOAL #2   Title pt will be able to verbalize and demonstrate techniques to reduce neck pain and headaches via postural awarenss, lifting and carrying mechanics and HEP (03/14/2015)   Status Achieved           PT Long Term Goals - 04/11/15 1039    PT LONG TERM GOAL #1   Title pt will be I with all HEP as of last visit ( 04/04/2015)   Status On-going   PT LONG TERM GOAL #2   Title pt will demonstrate reduced muscle spasm to promote improvement of cervical moblity and reduce pain to </=4/10 ( 04/04/2015)    Baseline 5/10   Status Partially Met   PT LONG TERM GOAL #3   Title pt will increase her cervical moblity by >/=8 degrees with </= 4/10 pain  in all planes to assist with safety during driving ( 03/03/9140)    Baseline met in rotation    Status Partially Met   PT LONG TERM GOAL #4   Title pt will be able to lift and carry >/=10# at shoulder height or higher with </= 4/10 pain to assist with ADLS (04/03/2014)   Status On-going   PT LONG TERM GOAL #5   Title pt will improve her FOTO score to >/= 58 to demontrate improved function at discharge (04/04/2015)    Baseline 47%   Status On-going               Plan - 04/17/15 1251    Clinical Impression Statement [name] reports initially increased pain to 8/10 since the last session which she reports could be attributed to BP. Assess BP which is 142/78. She provided consent for dry needling of the R upper trapezius and cervical multifus at C3-C4 multiple palpable twitches were elicited. Folllowing STM and mysofascial rolling and thoracic P>A mobs she reported decreased pain in the shoulder and neck. utilized MHP following todays session to decrease any soreness which she reported pain dropped to a 2/10 post session.  Plan to progress to exercise.   PT Next Visit Plan Assess response to dry needling, neck and shoulder stabilization strengthening, progress HEP,  modalities PRN, progress with exercise.    PT Home Exercise Plan reviewed stretches for upper trap and levator scapulae   Consulted and Agree with Plan of  Care Patient        Problem List Patient Active Problem List   Diagnosis Date Noted  . Bilateral occipital neuralgia 11/23/2014  . Cervicogenic headache 11/23/2014  . Neck pain 11/23/2014  . OSA on CPAP 11/23/2014  . Sleep apnea 03/10/2014  . AP (abdominal pain) 02/07/2014  . History of diverticulitis 02/07/2014  . Generalized anxiety disorder 02/04/2014  . Labile hypertension   . Fibromyalgia   . Chest pain, midsternal 01/05/2014  . Cervical spondylolysis 12/24/2013  . CAD (coronary artery disease) 12/24/2013  . Hyponatremia 12/23/2013  . Hypertensive emergency 12/23/2013  . Acute encephalopathy 12/23/2013  . Bronchitis, chronic obstructive w acute bronchitis ([place]) 12/21/2013  . Diverticulosis of colon without hemorrhage 11/15/2013  . Hot flashes related to aromatase inhibitor therapy 07/27/2013  . Osteopenia 07/27/2013  . DJD (degenerative joint disease) 09/29/2012  . Breast cancer, Left 12/20/2010  . Leg pain 11/28/2010  . FATTY LIVER DISEASE 01/09/2010  . GASTRITIS 12/16/2007  . Allergic rhinitis 02/18/2007  . DIZZINESS, CHRONIC 02/18/2007  . Headache in back of head 02/18/2007  . Mixed hyperlipidemia 12/23/2006  . GLAUCOMA 12/23/2006  . Coronary atherosclerosis 12/23/2006  . Asthma 12/23/2006  . GERD 12/23/2006  . IRRITABLE BOWEL SYNDROME 12/23/2006  . Myalgia and myositis 12/23/2006   Starr Lake PT, DPT, LAT, ATC  04/17/2015  1:03 PM     [facility] [address] [place], [place], [address] Phone: [phone]   Fax:  [phone]  Name: [name] MRN: [id number] Date of Birth: 08-15-1935

## 2015-04-18 DIAGNOSIS — M9903 Segmental and somatic dysfunction of lumbar region: Secondary | ICD-10-CM | POA: Diagnosis not present

## 2015-04-18 DIAGNOSIS — M9901 Segmental and somatic dysfunction of cervical region: Secondary | ICD-10-CM | POA: Diagnosis not present

## 2015-04-18 DIAGNOSIS — M542 Cervicalgia: Secondary | ICD-10-CM | POA: Diagnosis not present

## 2015-04-18 DIAGNOSIS — M545 Low back pain: Secondary | ICD-10-CM | POA: Diagnosis not present

## 2015-04-19 ENCOUNTER — Telehealth: Payer: Self-pay | Admitting: Pulmonary Disease

## 2015-04-19 MED ORDER — OMEPRAZOLE 40 MG PO CPDR: DELAYED_RELEASE_CAPSULE | ORAL | Status: DC

## 2015-04-19 NOTE — Telephone Encounter (Signed)
Called spoke with pt. She needs refill in omperazole sent for 30 day supply. I have done so. Nothing further needed

## 2015-04-21 DIAGNOSIS — J3089 Other allergic rhinitis: Secondary | ICD-10-CM | POA: Diagnosis not present

## 2015-04-21 DIAGNOSIS — J301 Allergic rhinitis due to pollen: Secondary | ICD-10-CM | POA: Diagnosis not present

## 2015-04-26 ENCOUNTER — Ambulatory Visit (INDEPENDENT_AMBULATORY_CARE_PROVIDER_SITE_OTHER): Payer: Medicare Other | Admitting: Neurology

## 2015-04-26 ENCOUNTER — Encounter: Payer: Self-pay | Admitting: Neurology

## 2015-04-26 ENCOUNTER — Ambulatory Visit: Payer: Medicare Other | Attending: Neurology | Admitting: Physical Therapy

## 2015-04-26 VITALS — BP 138/62 | HR 70 | Resp 16 | Ht <= 58 in | Wt 138.0 lb

## 2015-04-26 DIAGNOSIS — I2510 Atherosclerotic heart disease of native coronary artery without angina pectoris: Secondary | ICD-10-CM | POA: Diagnosis not present

## 2015-04-26 DIAGNOSIS — G4733 Obstructive sleep apnea (adult) (pediatric): Secondary | ICD-10-CM | POA: Diagnosis not present

## 2015-04-26 DIAGNOSIS — R51 Headache: Secondary | ICD-10-CM

## 2015-04-26 DIAGNOSIS — R293 Abnormal posture: Secondary | ICD-10-CM | POA: Diagnosis not present

## 2015-04-26 DIAGNOSIS — M542 Cervicalgia: Secondary | ICD-10-CM | POA: Insufficient documentation

## 2015-04-26 DIAGNOSIS — M62838 Other muscle spasm: Secondary | ICD-10-CM

## 2015-04-26 DIAGNOSIS — M5382 Other specified dorsopathies, cervical region: Secondary | ICD-10-CM | POA: Diagnosis not present

## 2015-04-26 DIAGNOSIS — M6248 Contracture of muscle, other site: Secondary | ICD-10-CM | POA: Insufficient documentation

## 2015-04-26 DIAGNOSIS — Z9989 Dependence on other enabling machines and devices: Principal | ICD-10-CM

## 2015-04-26 DIAGNOSIS — R519 Headache, unspecified: Secondary | ICD-10-CM

## 2015-04-26 NOTE — Progress Notes (Signed)
Subjective:    Patient ID: [name] is a 80 y.o. female.  HPI     Interim history:   [name] is a very pleasant 80 year old right-handed woman with an underlying medical history of fibromyalgia, coronary artery disease, status post CABG, irritable bowel syndrome, diverticulosis, breast cancer, hyperlipidemia, glaucoma, hypertension and overweight state, who presents for follow-up consultation of her obstructive sleep apnea, on treatment with CPAP at home. The patient is unaccompanied today. I first met her on 10/27/2014 at the request of Dr. Krista Blue and Cecille Rubin, at which time the patient was advised regarding her split-night sleep study results from 04/25/2014. She had evidence of moderate obstructive sleep apnea. She did well with CPAP. She was compliant with CPAP therapy at the time and indicated improvement of her sleep including feeling better rested and having less daytime somnolence. Headaches were about the same and she was supposed to start seeing Dr. Delice Lesch for this.    Today, 04/26/2015: I reviewed her CPAP compliance data from 03/26/2015 through 04/24/2015 which is a total of 30 days during which time she used her machine every night with percent used days greater than 4 hours at 100%, indicating superb compliance with an average usage of 7 hours and 1 minute, residual AHI low at 0.6 per hour, leak low with the 95th percentile at 0.9 L/m at a pressure of 6 cm with EPR.  Today, 04/26/2015: She reports doing okay, sometimes mask a little uncomfortable. Overall, she is motivated to stay on CPAP therapy. We talked about her test results from her sleep study from 04/25/2014 briefly again today. As far as her headaches, she has been seeing Dr. Delice Lesch. She had trigger point injection once with Dr. Tomi Likens, with mixed results, not telltale response, then was referred to PT and has had 2 treatments with dry needling. The second one may have helped. She has another appointment this  morning. She has a FU with Dr. Delice Lesch later this month. Has nocturia 1-2 times per night.   Previously:   10/27/2014: She was referred for a sleep study. She reported morning headaches, nocturia, nonrestorative sleep and excessive daytime somnolence as well as snoring. She had a split-night sleep study on 04/25/2014 and I went over her test results with her in detail today. Her baseline sleep efficiency was reduced at 53.5% with a latency to sleep of 42.5 minutes and wake after sleep onset of 61 minutes with moderate to severe sleep fragmentation noted. She had a markedly elevated arousal index. She had an increased percentage of light stage sleep and absence of slow-wave sleep and REM sleep. She had PLMS at 62 per hour, with minimal arousals. She had mild to moderate snoring. Total AHI was 16.1 per hour. Average oxygen saturation was 95%, nadir was 85%. She was then titrated on CPAP. Sleep efficiency was 83.3%, latency to sleep 9 minutes and wake after sleep onset 16.5 minutes with mild sleep fragmentation noted. She had slow-wave sleep at 8.3% in rem sleep at 25.6%. Average oxygen saturation was 98%, nadir was 96%. Snoring was eliminated. She had a PLM index of 43.5 per hour with no arousals. She was titrated from 5 cm to 6 cm. AHI was 0 per hour on the final pressure with supine REM sleep achieved. Based on her test results are prescribed CPAP therapy for home use.  I reviewed her CPAP compliance data from 09/26/2014 through 10/25/2014 which is a total of 30 days during which time she used her machine every night  with percent used days greater than 4 hours at 97%, indicating excellent compliance with an average usage of 7 hours and 4 minutes, residual AHI low at syrup 0.5 per hour, leak low with the 95th percentile at 3.8 L/m on a pressure of 6 cm.  She reports doing fairly well. Overall, she feels a little bit better rested when she uses CPAP and still is struggling somewhat with the mask. She uses nasal  pillows. She started CPAP therapy in mid June. She had some questions about the machine and how to clean the house and so forth and I tried to answer them but I also asked her to get in touch with her DME company, [employer]. As far as her bedtime goes, she tries to be in bed between 10 and 10:30. Her rise time is 6 to 6:30 AM. She used to have restless leg symptoms even as a child. Her husband has reported to her that her twitching has improved. She does not endorse bothersome restless leg symptoms at this time. Her headaches are about the same. Her nocturia is still about the same, 2-3 times in an average night. She does not drink alcohol and does not drink caffeine on a regular basis and is a nonsmoker. She lives with her husband. She has 2 grown daughters and 3 granddaughters.   Her Past Medical History Is Significant For: Past Medical History  Diagnosis Date  . Diverticulosis of colon (without mention of hemorrhage)   . Irritable bowel syndrome   . Colonic polyp 04-27-2009    tubular adenoma  . Headache(784.0)     a. frequently assocaited with high BPs  . Breast cancer, Left 12/20/2010    NO BLOOD PRESSURE CHECKS OR STICKS IN LEFT ARM  . Gallstones   . CAD (coronary artery disease)     a. 01/19/2010 s/p CABG x 3, lima->lad, vg->diag, vg->om1;  b. 06/2011 :Lexi MV: EF 84%, No ischemia. c. 01/06/14 s/p negative nuclear stress test with EF >70%  . Fibromyalgia   . Atrophic vaginitis   . GERD (gastroesophageal reflux disease)   . Hypercholesterolemia   . Glaucoma   . Cervical arthritis ([place])   . Hiatal hernia   . Labile hypertension   . Asthma     Her Past Surgical History Is Significant For: Past Surgical History  Procedure Laterality Date  . Coronary angioplasty with stent placement      Stent 2007  . Open heart surgery      01/19/2010  . Coronary artery bypass graft    . Cataract extraction, bilateral      bilateral caaract removal,  . Breast lumpectomy Left 02/06/11    Her Family  History Is Significant For: Family History  Problem Relation Age of Onset  . Heart disease Brother   . Cancer Brother     gland cancer  . Diabetes Sister   . Breast cancer Sister   . Stroke Mother   . Stroke Father   . Colon cancer Neg Hx   . Stomach cancer Neg Hx   . Pancreatic cancer Neg Hx   . Prostate cancer Father   . Kidney disease Neg Hx   . Liver disease Neg Hx     Her Social History Is Significant For: Social History   Social History  . Marital Status: Married    Spouse Name: Gildardo Griffes. [place]  . Number of Children: 2  . Years of Education: 12   Occupational History  . part time preschool teacher-retired  Social History Main Topics  . Smoking status: Never Smoker   . Smokeless tobacco: Never Used  . Alcohol Use: No  . Drug Use: No  . Sexual Activity: Yes    Birth Control/ Protection: Post-menopausal   Other Topics Concern  . None   Social History Narrative   Patient lives at home with her husband Marcello Moores). Patient is retired. Patient  Has 12 th grade education.    Caffeine- sometimes- One cup of coffee.   Right handed.   Four granddaughters.    Her Allergies Are:  Allergies  Allergen Reactions  . Choline Fenofibrate Other (See Comments)     pt states INTOL to Trilipix w/ "thigh burning"  . Simvastatin Other (See Comments)     pt states INTOL to STATINS \T\ refuses to restart  . Hctz [Hydrochlorothiazide]     Causes hyponatremia  . Hydrocodone     Nightmare after taking cough syrup w/hydrocodone  . Levaquin [Levofloxacin In D5w]     Elevated BP  . Adhesive [Tape] Rash  . Ceclor [Cefaclor] Rash  . Clarithromycin Rash  . Codeine Nausea Only  . Doxycycline Rash  . Lisinopril Cough    Developed ACE cough...  . Penicillins Itching and Rash    At injection site  . Tobramycin-Dexamethasone Rash  :   Her Current Medications Are:  Outpatient Encounter Prescriptions as of 04/26/2015  Medication Sig  . Acetaminophen (TYLENOL 8 HOUR PO) Take by  mouth as directed.   [name] amLODipine (NORVASC) 5 MG tablet Take 1 tablet (5 mg total) by mouth daily.  [name] aspirin EC 81 MG tablet Take 81 mg by mouth daily.  . clobetasol (TEMOVATE) 0.05 % external solution Apply 1 application topically 2 (two) times daily.  . fexofenadine (ALLEGRA) 180 MG tablet Take 180 mg by mouth daily.    . fluocinonide (LIDEX) 0.05 % external solution Apply 1 application topically daily as needed (scalp itching).   . Glucosamine-Chondroit-Vit C-Mn (GLUCOSAMINE CHONDR 1500 COMPLX PO) Take 1 tablet by mouth 3 (three) times daily.    . hydrALAZINE (APRESOLINE) 50 MG tablet Take 0.5 tablets (25 mg total) by mouth 3 (three) times daily.  . hyoscyamine (ANASPAZ) 0.125 MG TBDP disintergrating tablet Place 0.125 mg under the tongue every 6 (six) hours as needed for bladder spasms or cramping.  [name] ketoconazole (NIZORAL) 2 % shampoo Apply 1 application topically 2 (two) times a week. As needed for itching a couple of times week  . latanoprost (XALATAN) 0.005 % ophthalmic solution Place 1 drop into both eyes at bedtime.   [name] letrozole (FEMARA) 2.5 MG tablet Take 1 tablet (2.5 mg total) by mouth daily.  [name] losartan (COZAAR) 100 MG tablet take 1/2 tablet by mouth three times a day  . magnesium oxide (MAG-OX) 400 MG tablet Take 400 mg by mouth daily. Reported on 02/14/2015  . metoprolol (LOPRESSOR) 50 MG tablet Take 1 tablet (50 mg total) by mouth 3 (three) times daily.  . mometasone (NASONEX) 50 MCG/ACT nasal spray Place 2 sprays into the nose daily as needed (congestion).   . nitroGLYCERIN (NITROSTAT) 0.4 MG SL tablet Place 1 tablet (0.4 mg total) under the tongue every 5 (five) minutes as needed for chest pain.  . Omega-3 Fatty Acids (FISH OIL) 1200 MG CAPS Take 1,200 mg by mouth daily. Reported on 02/14/2015  . omeprazole (PRILOSEC) 40 MG capsule take 1 capsule by mouth once daily 30 MINUTES BEFORE 1ST MEAL OF THE DAY  . ranitidine (ZANTAC) 300 MG capsule  Take 1 capsule (300 mg total) by  mouth every evening.  . traMADol (ULTRAM) 50 MG tablet Take 50 mg by mouth as directed.  . vitamin B-12 (CYANOCOBALAMIN) 1000 MCG tablet Take 1,000 mcg by mouth daily.     No facility-administered encounter medications on file as of 04/26/2015.  :  Review of Systems:  Out of a complete 14 point review of systems, all are reviewed and negative with the exception of these symptoms as listed below:   Review of Systems  Neurological:       Patient feels like she is doing ok on CPAP. Has some mask fitting concerns.     Objective:  Neurologic Exam  Physical Exam Physical Examination:   Filed Vitals:   04/26/15 0915  BP: 138/62  Pulse: 70  Resp: 16   General Examination: The patient is a very pleasant 80 y.o. female in no acute distress. She appears well-developed and well-nourished and well groomed.   HEENT: Normocephalic, atraumatic, pupils are equal, round and reactive to light and accommodation. Funduscopic exam is normal with sharp disc margins noted.She is status post bilateral cataract repairs. Extraocular tracking is good without limitation to gaze excursion or nystagmus noted. Normal smooth pursuit is noted. Hearing is grossly intact. Face is symmetric with normal facial animation and normal facial sensation. Speech is clear with no dysarthria noted. There is no hypophonia. There is no lip, neck/head, jaw or voice tremor. Neck is supple with full range of passive and active motion. There are no carotid bruits on auscultation. Oropharynx exam reveals: mild to moderate mouth dryness, adequate dental hygiene and mild airway crowding, due to narrow airway entry and floppy appearing soft palate. Mallampati is class II. Tongue protrudes centrally and palate elevates symmetrically. Tonsils are small.   Chest: Clear to auscultation without wheezing, rhonchi or crackles noted.  Heart: S1+S2+0, regular and normal without murmurs, rubs or gallops noted.   Abdomen: Soft, non-tender and  non-distended with normal bowel sounds appreciated on auscultation.  Extremities: There is no pitting edema in the distal lower extremities bilaterally. Pedal pulses are intact.  Skin: Warm and dry without trophic changes noted. There are no varicose veins.  Musculoskeletal: exam reveals no obvious joint deformities, tenderness or joint swelling or erythema.   Neurologically:  Mental status: The patient is awake, alert and oriented in all 4 spheres. Her immediate and remote memory, attention, language skills and fund of knowledge are appropriate. There is no evidence of aphasia, agnosia, apraxia or anomia. Speech is clear with normal prosody and enunciation. Thought process is linear. Mood is normal and affect is normal.  Cranial nerves II - XII are as described above under HEENT exam. In addition: shoulder shrug is normal with equal shoulder height noted. Motor exam: Normal bulk, strength and tone is noted. There is no drift, tremor or rebound. Romberg is negative. Reflexes are 1-2+ throughout. Fine motor skills and coordination: intact in the upper and lower extremities.  Cerebellar testing: No dysmetria or intention tremor on finger to nose testing. Heel to shin is unremarkable bilaterally. There is no truncal or gait ataxia.  Sensory exam: intact to light touch in the upper and lower extremities.  Gait, station and balance: She stands easily. No veering to one side is noted. No leaning to one side is noted. Posture is age-appropriate and stance is narrow based. Gait shows normal stride length and normal pace. No problems turning are noted. She turns in 3 steps. Tandem walk is slightly difficult for her,  unchanged.   Assessment and Plan:   In summary, SHATASIA CUTSHAW is a very pleasant 80 year old female with an underlying medical history of fibromyalgia, coronary artery disease, status post CABG, irritable bowel syndrome, diverticulosis, breast cancer, hyperlipidemia, glaucoma, hypertension  and overweight state, who Presents for follow-up consultation of her obstructive sleep apnea, on CPAP therapy since June 2016 with full compliance. While she did not have a telltale response from it, she overall has tolerated it well and given her medical history and her history of recurrent headaches, I encouraged her to stay on therapy with CPAP. We talked about her sleep study results from last year and her most recent compliance data. She is congratulated on her superb treatment adherence. For headaches she has seen Dr. Delice Lesch and has undergone trigger point injections but now is in physical therapy and is getting dry needling. She had her second treatment last week and feels that it may have helped. She has another appointment today and then in 2 weeks and then a follow-up later this month with Dr. Delice Lesch. I did ask her to stay better hydrated. She is stable with respect to her physical and neurological exam. At this juncture, I suggested from a sleep apnea standpoint that she follow-up with me in one year. She has been getting her supplies from [employer], and is encouraged to call with any interim problems or questions regarding her CPAP or supplies. I answered all her questions today and she was in agreement.  I spent 25 minutes in total face-to-face time with the patient, more than 50% of which was spent in counseling and coordination of care, reviewing test results, reviewing medication and discussing or reviewing the diagnosis of OSA, recurrent occipital HAs, its prognosis and treatment options.

## 2015-04-26 NOTE — Therapy (Signed)
[facility] [place], [place], [address] Phone: [phone]   Fax:  [phone]  Physical Therapy Treatment  Patient Details  Name: [name] MRN: [id number] Date of Birth: [birth year] Referring Provider: Cameron Sprang  Encounter Date: 04/26/2015      PT End of Session - 04/26/15 1229    Visit Number 9   Number of Visits 11   Date for PT Re-Evaluation 05/09/15   Authorization Type Medicare: kx mod by 15th visit, progress note by 10th visit   PT Start Time 1151   PT Stop Time 1239   PT Time Calculation (min) 48 min   Activity Tolerance Patient tolerated treatment well   Behavior During Therapy [facility] for tasks assessed/performed      Past Medical History  Diagnosis Date  . Diverticulosis of colon (without mention of hemorrhage)   . Irritable bowel syndrome   . Colonic polyp 04-27-2009    tubular adenoma  . Headache(784.0)     a. frequently assocaited with high BPs  . Breast cancer, Left 12/20/2010    NO BLOOD PRESSURE CHECKS OR STICKS IN LEFT ARM  . Gallstones   . CAD (coronary artery disease)     a. 01/19/2010 s/p CABG x 3, lima->lad, vg->diag, vg->om1;  b. 06/2011 :Lexi MV: EF 84%, No ischemia. c. 01/06/14 s/p negative nuclear stress test with EF >70%  . Fibromyalgia   . Atrophic vaginitis   . GERD (gastroesophageal reflux disease)   . Hypercholesterolemia   . Glaucoma   . Cervical arthritis ([place])   . Hiatal hernia   . Labile hypertension   . Asthma     Past Surgical History  Procedure Laterality Date  . Coronary angioplasty with stent placement      Stent 2007  . Open heart surgery      01/19/2010  . Coronary artery bypass graft    . Cataract extraction, bilateral      bilateral caaract removal,  . Breast lumpectomy Left 02/06/11    There were no vitals filed for this visit.  Visit Diagnosis:  Neck pain  Muscle spasms of neck  Decreased ROM of intervertebral discs of cervical  spine  Abnormal posture      Subjective Assessment - 04/26/15 1153    Subjective "I feel like the dry needling helped, but I am still having some tightness in the neck/ head"   Currently in Pain? Yes   Pain Score 5    Pain Location Neck   Pain Orientation Right;Left   Pain Descriptors / Indicators Tightness   Pain Type Chronic pain   Pain Onset More than a month ago   Pain Frequency Intermittent                         [facility] Adult PT Treatment/Exercise - 04/26/15 1157    Neck Exercises: Machines for Strengthening   UBE (Upper Arm Bike) L1 x 4 min each way, larg bolster for feet   Neck Exercises: Supine   Neck Retraction 10 reps;10 secs  with towel rolled up at top of head   Other Supine Exercise thoracic extension over bolster 2 x 15  reported relief of pain in neck and head   Moist Heat Therapy   Number Minutes Moist Heat 10 Minutes   Moist Heat Location Cervical  and upper thoracic spine in supine   Manual Therapy   Joint Mobilization grade 3 cervical mobs C3-C7 P>A  Myofascial Release Rolling / stretching of the R upper trap and Cervical multifidus   Neck Exercises: Stretches   Upper Trapezius Stretch 2 reps;30 seconds                PT Education - 04/26/15 1228    Education provided Yes   Education Details updated HEP   Person(s) Educated Patient   Methods Explanation   Comprehension Verbalized understanding          PT Short Term Goals - 04/11/15 1039    PT SHORT TERM GOAL #1   Title pt will be I with initial HEP  (03/14/2015)   Status Achieved   PT SHORT TERM GOAL #2   Title pt will be able to verbalize and demonstrate techniques to reduce neck pain and headaches via postural awarenss, lifting and carrying mechanics and HEP (03/14/2015)   Status Achieved           PT Long Term Goals - 04/26/15 1232    PT LONG TERM GOAL #1   Title pt will be I with all HEP as of last visit ( 04/04/2015)   Time 6   Period Weeks   Status  On-going   PT LONG TERM GOAL #2   Title pt will demonstrate reduced muscle spasm to promote improvement of cervical moblity and reduce pain to </=4/10 ( 04/04/2015)    Baseline 5/10   Time 6   Period Weeks   Status Partially Met   PT LONG TERM GOAL #3   Title pt will increase her cervical moblity by >/=8 degrees with </= 4/10 pain  in all planes to assist with safety during driving ( 04/06/7987)    Time 6   Period Weeks   Status Partially Met   PT LONG TERM GOAL #4   Title pt will be able to lift and carry >/=10# at shoulder height or higher with </= 4/10 pain to assist with ADLS (03/29/1939)   Time 6   Period Weeks   Status On-going   PT LONG TERM GOAL #5   Title pt will improve her FOTO score to >/= 58 to demontrate improved function at discharge (04/04/2015)    Time 6   Period Weeks   Status On-going               Plan - 04/26/15 1229    Clinical Impression Statement [name] reported relief with the dry needling last visit. She opted not for dry needling today, worked on manual trigger point release of the R upper trap and sub-occipital release. following thoracic extension over foam roll she reported relief of pain in the neck and head. following todays session she reported pain dropped to a 2/10.    PT Next Visit Plan neck and shoulder stabilization strengthening, progress HEP, modalities PRN, progress with exercise.    PT Home Exercise Plan thoracic extension over chair or rolled up towel or pillow   Consulted and Agree with Plan of Care Patient        Problem List Patient Active Problem List   Diagnosis Date Noted  . Bilateral occipital neuralgia 11/23/2014  . Cervicogenic headache 11/23/2014  . Neck pain 11/23/2014  . OSA on CPAP 11/23/2014  . Sleep apnea 03/10/2014  . AP (abdominal pain) 02/07/2014  . History of diverticulitis 02/07/2014  . Generalized anxiety disorder 02/04/2014  . Labile hypertension   . Fibromyalgia   . Chest pain, midsternal 01/05/2014  .  Cervical spondylolysis 12/24/2013  . CAD (coronary artery  disease) 12/24/2013  . Hyponatremia 12/23/2013  . Hypertensive emergency 12/23/2013  . Acute encephalopathy 12/23/2013  . Bronchitis, chronic obstructive w acute bronchitis ([place]) 12/21/2013  . Diverticulosis of colon without hemorrhage 11/15/2013  . Hot flashes related to aromatase inhibitor therapy 07/27/2013  . Osteopenia 07/27/2013  . DJD (degenerative joint disease) 09/29/2012  . Breast cancer, Left 12/20/2010  . Leg pain 11/28/2010  . FATTY LIVER DISEASE 01/09/2010  . GASTRITIS 12/16/2007  . Allergic rhinitis 02/18/2007  . DIZZINESS, CHRONIC 02/18/2007  . Headache in back of head 02/18/2007  . Mixed hyperlipidemia 12/23/2006  . GLAUCOMA 12/23/2006  . Coronary atherosclerosis 12/23/2006  . Asthma 12/23/2006  . GERD 12/23/2006  . IRRITABLE BOWEL SYNDROME 12/23/2006  . Myalgia and myositis 12/23/2006   Starr Lake PT, DPT, LAT, ATC  04/26/2015  12:41 PM     [facility] [address] [place], [place], [address] Phone: [phone]   Fax:  [phone]  Name: [name] MRN: [id number] Date of Birth: 01-19-1936

## 2015-04-26 NOTE — Patient Instructions (Signed)
Please continue using your CPAP regularly. While your insurance requires that you use CPAP at least 4 hours each night on 70% of the nights, I recommend, that you not skip any nights and use it throughout the night if you can. Getting used to CPAP and staying with the treatment long term does take time and patience and discipline. Untreated obstructive sleep apnea when it is moderate to severe can have an adverse impact on cardiovascular health and raise her risk for heart disease, arrhythmias, hypertension, congestive heart failure, stroke and diabetes. Untreated obstructive sleep apnea causes sleep disruption, nonrestorative sleep, and sleep deprivation. This can have an impact on your day to day functioning and cause daytime sleepiness and impairment of cognitive function, memory loss, mood disturbance, and problems focussing. Using CPAP regularly can improve these symptoms.  Keep up the good work! I will see you back in one year for sleep apnea check up.

## 2015-04-27 DIAGNOSIS — K219 Gastro-esophageal reflux disease without esophagitis: Secondary | ICD-10-CM | POA: Diagnosis not present

## 2015-04-27 DIAGNOSIS — J3089 Other allergic rhinitis: Secondary | ICD-10-CM | POA: Diagnosis not present

## 2015-04-27 DIAGNOSIS — J4520 Mild intermittent asthma, uncomplicated: Secondary | ICD-10-CM | POA: Diagnosis not present

## 2015-04-27 DIAGNOSIS — J301 Allergic rhinitis due to pollen: Secondary | ICD-10-CM | POA: Diagnosis not present

## 2015-05-02 ENCOUNTER — Encounter: Payer: Self-pay | Admitting: *Deleted

## 2015-05-02 DIAGNOSIS — M545 Low back pain: Secondary | ICD-10-CM | POA: Diagnosis not present

## 2015-05-02 DIAGNOSIS — M9903 Segmental and somatic dysfunction of lumbar region: Secondary | ICD-10-CM | POA: Diagnosis not present

## 2015-05-02 DIAGNOSIS — M542 Cervicalgia: Secondary | ICD-10-CM | POA: Diagnosis not present

## 2015-05-02 DIAGNOSIS — J3089 Other allergic rhinitis: Secondary | ICD-10-CM | POA: Diagnosis not present

## 2015-05-02 DIAGNOSIS — M9901 Segmental and somatic dysfunction of cervical region: Secondary | ICD-10-CM | POA: Diagnosis not present

## 2015-05-02 DIAGNOSIS — J301 Allergic rhinitis due to pollen: Secondary | ICD-10-CM | POA: Diagnosis not present

## 2015-05-03 ENCOUNTER — Ambulatory Visit: Payer: Medicare Other | Admitting: Physical Therapy

## 2015-05-03 DIAGNOSIS — M542 Cervicalgia: Secondary | ICD-10-CM

## 2015-05-03 DIAGNOSIS — M62838 Other muscle spasm: Secondary | ICD-10-CM

## 2015-05-03 DIAGNOSIS — M5382 Other specified dorsopathies, cervical region: Secondary | ICD-10-CM

## 2015-05-03 DIAGNOSIS — R293 Abnormal posture: Secondary | ICD-10-CM | POA: Diagnosis not present

## 2015-05-03 DIAGNOSIS — M6248 Contracture of muscle, other site: Secondary | ICD-10-CM | POA: Diagnosis not present

## 2015-05-03 NOTE — Therapy (Signed)
[facility] [address] [place], [place], [address] Phone: [phone]   Fax:  [phone]  Physical Therapy Treatment  Patient Details  Name: [name] MRN: [id number] Date of Birth: 1935/10/23 Referring Provider: Van Clines  Encounter Date: 05/03/2015      PT End of Session - 05/03/15 1113    Visit Number 10   Number of Visits 11   Date for PT Re-Evaluation 05/09/15   PT Start Time 1015   PT Stop Time 1120   PT Time Calculation (min) 65 min   Activity Tolerance Patient tolerated treatment well   Behavior During Therapy [facility] for tasks assessed/performed      Past Medical History  Diagnosis Date  . Diverticulosis of colon (without mention of hemorrhage)   . Irritable bowel syndrome   . Colonic polyp 04-27-2009    tubular adenoma  . Headache(784.0)     a. frequently assocaited with high BPs  . Breast cancer, Left 12/20/2010    NO BLOOD PRESSURE CHECKS OR STICKS IN LEFT ARM  . Gallstones   . CAD (coronary artery disease)     a. 01/19/2010 s/p CABG x 3, lima->lad, vg->diag, vg->om1;  b. 06/2011 :Lexi MV: EF 84%, No ischemia. c. 01/06/14 s/p negative nuclear stress test with EF >70%  . Fibromyalgia   . Atrophic vaginitis   . GERD (gastroesophageal reflux disease)   . Hypercholesterolemia   . Glaucoma   . Cervical arthritis (HCC)   . Hiatal hernia   . Labile hypertension   . Asthma     Past Surgical History  Procedure Laterality Date  . Coronary angioplasty with stent placement      Stent 2007  . Open heart surgery      01/19/2010  . Coronary artery bypass graft    . Cataract extraction, bilateral      bilateral caaract removal,  . Breast lumpectomy Left 02/06/11    There were no vitals filed for this visit.  Visit Diagnosis:  Neck pain  Muscle spasms of neck  Decreased ROM of intervertebral discs of cervical spine  Abnormal posture      Subjective Assessment - 05/03/15 1016    Subjective Not able to do exercises all the time, but I do do scapular pinches and upper trap stretches.  No headach,  neck 3/10.  Headach yesterday lasting most of the day.    Currently in Pain? Yes   Pain Score 3    Pain Location Neck   Pain Orientation Right;Left;Posterior   Pain Descriptors / Indicators Tightness;Tingling   Pain Radiating Towards RT>LT   off and on daily   Aggravating Factors  not sure   Pain Relieving Factors posture stretches.                          [facility] Adult PT Treatment/Exercise - 05/03/15 1027    Neck Exercises: Supine   Other Supine Exercise Supine scapular series, red band 10 X minor cues given,  Head press with tongue press, , shoulder press with fist, Hand to fist press with head press, head press with circles, horizontal add/Abduction, flexion/extension arms only and with bent knee march,    Other Supine Exercise Cervical stabilization 1 and 2 exercises  10 X each.     Moist Heat Therapy   Number Minutes Moist Heat 15 Minutes   Moist Heat Location Cervical  and upper back   Manual Therapy   Manual  Therapy Soft tissue mobilization   Other Manual Therapy neck and upper traps. post exercise.                 PT Education - 05/03/15 1112    Education provided Yes   Education Details supine scapular stabilization 2   Person(s) Educated Patient   Methods Explanation;Demonstration;Tactile cues;Verbal cues;Handout   Comprehension Verbalized understanding;Returned demonstration          PT Short Term Goals - 04/11/15 1039    PT SHORT TERM GOAL #1   Title pt will be I with initial HEP  (03/14/2015)   Status Achieved   PT SHORT TERM GOAL #2   Title pt will be able to verbalize and demonstrate techniques to reduce neck pain and headaches via postural awarenss, lifting and carrying mechanics and HEP (03/14/2015)   Status Achieved           PT Long Term Goals - 05/03/15 1221    PT LONG TERM GOAL #1   Title pt will be I with  all HEP as of last visit ( 04/04/2015)   Baseline cues needed   Time 6   Period Weeks   Status On-going   PT LONG TERM GOAL #2   Title pt will demonstrate reduced muscle spasm to promote improvement of cervical moblity and reduce pain to </=4/10 ( 04/04/2015)    Baseline 3/10 today, not sure if this is consistant   Time 6   Period Weeks   Status Partially Met   PT LONG TERM GOAL #3   Title pt will increase her cervical moblity by >/=8 degrees with </= 4/10 pain  in all planes to assist with safety during driving ( 08/26/5364)    Time 6   Period Weeks   Status Unable to assess   PT LONG TERM GOAL #4   Title pt will be able to lift and carry >/=10# at shoulder height or higher with </= 4/10 pain to assist with ADLS (05/29/345)   Time 6   Period Weeks   Status Unable to assess   PT LONG TERM GOAL #5   Title pt will improve her FOTO score to >/= 58 to demontrate improved function at discharge (04/04/2015)    Baseline 48% limitation   Time 6   Period Weeks   Status On-going               Plan - 05/03/15 1156    Clinical Impression Statement Progress toward home exercise program.  She admits to not doing her home exercise program.  FOTO today:  48% limitation current status which is equal to Intake status. Pain goal partially met with 3/10 pain neck.  Less pain post session   PT Next Visit Plan POC ends soon.  D/C?   review neck stabilization 2 exercises.     PT Home Exercise Plan Cervical stabilization 2 exercises   Consulted and Agree with Plan of Care Patient        Problem List Patient Active Problem List   Diagnosis Date Noted  . Bilateral occipital neuralgia 11/23/2014  . Cervicogenic headache 11/23/2014  . Neck pain 11/23/2014  . OSA on CPAP 11/23/2014  . Sleep apnea 03/10/2014  . AP (abdominal pain) 02/07/2014  . History of diverticulitis 02/07/2014  . Generalized anxiety disorder 02/04/2014  . Labile hypertension   . Fibromyalgia   . Chest pain, midsternal  01/05/2014  . Cervical spondylolysis 12/24/2013  . CAD (coronary artery disease) 12/24/2013  . Hyponatremia  12/23/2013  . Hypertensive emergency 12/23/2013  . Acute encephalopathy 12/23/2013  . Bronchitis, chronic obstructive w acute bronchitis ([place]) 12/21/2013  . Diverticulosis of colon without hemorrhage 11/15/2013  . Hot flashes related to aromatase inhibitor therapy 07/27/2013  . Osteopenia 07/27/2013  . DJD (degenerative joint disease) 09/29/2012  . Breast cancer, Left 12/20/2010  . Leg pain 11/28/2010  . FATTY LIVER DISEASE 01/09/2010  . GASTRITIS 12/16/2007  . Allergic rhinitis 02/18/2007  . DIZZINESS, CHRONIC 02/18/2007  . Headache in back of head 02/18/2007  . Mixed hyperlipidemia 12/23/2006  . GLAUCOMA 12/23/2006  . Coronary atherosclerosis 12/23/2006  . Asthma 12/23/2006  . GERD 12/23/2006  . IRRITABLE BOWEL SYNDROME 12/23/2006  . Myalgia and myositis 12/23/2006    [facility] 05/03/2015, 12:26 PM  [facility] [address] [place], [place], [address] Phone: [phone]   Fax:  [phone]  Name: [name] MRN: [id number] Date of Birth: Sep 04, 1935    Melvenia Needles, PTA 05/03/2015 12:26 PM Phone: [phone] Fax: [phone]

## 2015-05-03 NOTE — Patient Instructions (Signed)
From exercise drawer:  Cervical stabilization 2 exercises, all issued 1 X a day, 10 X each.

## 2015-05-09 DIAGNOSIS — H40013 Open angle with borderline findings, low risk, bilateral: Secondary | ICD-10-CM | POA: Diagnosis not present

## 2015-05-10 ENCOUNTER — Ambulatory Visit: Payer: Medicare Other | Admitting: Physical Therapy

## 2015-05-10 DIAGNOSIS — M5382 Other specified dorsopathies, cervical region: Secondary | ICD-10-CM

## 2015-05-10 DIAGNOSIS — R293 Abnormal posture: Secondary | ICD-10-CM | POA: Diagnosis not present

## 2015-05-10 DIAGNOSIS — M6248 Contracture of muscle, other site: Secondary | ICD-10-CM | POA: Diagnosis not present

## 2015-05-10 DIAGNOSIS — M542 Cervicalgia: Secondary | ICD-10-CM

## 2015-05-10 DIAGNOSIS — M62838 Other muscle spasm: Secondary | ICD-10-CM

## 2015-05-10 NOTE — Therapy (Signed)
[place], [place], [address] Phone: [phone]   Fax:  [phone]  Physical Therapy Treatment / discharge note  Patient Details  Name: [name] MRN: [id number] Date of Birth: 1936/01/23 Referring Provider: Cameron Sprang  Encounter Date: 05/10/2015      PT End of Session - 05/10/15 1026    Visit Number 11   Number of Visits 11   Date for PT Re-Evaluation 05/11/15   Authorization Type Medicare: kx mod by 15th visit, progress note by 10th visit   PT Start Time 1017   PT Stop Time 1050   PT Time Calculation (min) 33 min   Activity Tolerance Patient tolerated treatment well   Behavior During Therapy [facility] for tasks assessed/performed      Past Medical History  Diagnosis Date  . Diverticulosis of colon (without mention of hemorrhage)   . Irritable bowel syndrome   . Colonic polyp 04-27-2009    tubular adenoma  . Headache(784.0)     a. frequently assocaited with high BPs  . Breast cancer, Left 12/20/2010    NO BLOOD PRESSURE CHECKS OR STICKS IN LEFT ARM  . Gallstones   . CAD (coronary artery disease)     a. 01/19/2010 s/p CABG x 3, lima->lad, vg->diag, vg->om1;  b. 06/2011 :Lexi MV: EF 84%, No ischemia. c. 01/06/14 s/p negative nuclear stress test with EF >70%  . Fibromyalgia   . Atrophic vaginitis   . GERD (gastroesophageal reflux disease)   . Hypercholesterolemia   . Glaucoma   . Cervical arthritis ([place])   . Hiatal hernia   . Labile hypertension   . Asthma     Past Surgical History  Procedure Laterality Date  . Coronary angioplasty with stent placement      Stent 2007  . Open heart surgery      01/19/2010  . Coronary artery bypass graft    . Cataract extraction, bilateral      bilateral caaract removal,  . Breast lumpectomy Left 02/06/11    There were no vitals filed for this visit.  Visit Diagnosis:  Neck pain - Plan: PT plan of care cert/re-cert  Muscle spasms of neck -  Plan: PT plan of care cert/re-cert  Decreased ROM of intervertebral discs of cervical spine - Plan: PT plan of care cert/re-cert  Abnormal posture - Plan: PT plan of care cert/re-cert      Subjective Assessment - 05/10/15 1019    Subjective " I was doing my exercises at home and it caused my shoulder to get tight"   Currently in Pain? Yes   Pain Score 6    Pain Location Neck   Pain Orientation Right   Pain Descriptors / Indicators Tightness;Tingling            [facility] PT Assessment - 05/10/15 1041    Observation/Other Assessments   Focus on Therapeutic Outcomes (FOTO)  39% limited   AROM   Cervical Flexion 33   Cervical Extension 58   Cervical - Right Side Bend 34   Cervical - Left Side Bend 32   Cervical - Right Rotation 55   Cervical - Left Rotation 55                     [facility] Adult PT Treatment/Exercise - 05/10/15 0001    Self-Care   Posture to keep shoulders down during exericise to avoid over activation of the muscles. to perform exercise infront of a mirror  to monitor for correct form.    Other Self-Care Comments  how to perform manual trigger point release with back nobber and where she can purchase one, using tennis balls for sub-ooccipital release which was used in clinic she reported it relieved alot of pain   Neck Exercises: Machines for Strengthening   UBE (Upper Arm Bike) L1 x 4 min each way, larg bolster for feet   Manual Therapy   Manual therapy comments sub-occipital release                 PT Education - May 27, 2015 1152    Education provided Yes   Education Details HEP review   Person(s) Educated Patient   Methods Explanation   Comprehension Verbalized understanding          PT Short Term Goals - 04/11/15 1039    PT SHORT TERM GOAL #1   Title pt will be I with initial HEP  (03/14/2015)   Status Achieved   PT SHORT TERM GOAL #2   Title pt will be able to verbalize and demonstrate techniques to reduce neck pain and headaches  via postural awarenss, lifting and carrying mechanics and HEP (03/14/2015)   Status Achieved           PT Long Term Goals - May 27, 2015 1054    PT LONG TERM GOAL #1   Title pt will be I with all HEP as of last visit ( 04/04/2015)   Time 6   Period Weeks   Status Achieved   PT LONG TERM GOAL #2   Title pt will demonstrate reduced muscle spasm to promote improvement of cervical moblity and reduce pain to </=4/10 ( 04/04/2015)    Time 6   Period Weeks   Status Achieved   PT LONG TERM GOAL #3   Title pt will increase her cervical moblity by >/=8 degrees with </= 4/10 pain  in all planes to assist with safety during driving ( 03/02/1094)    Time 6   Period Weeks   Status Achieved   PT LONG TERM GOAL #4   Title pt will be able to lift and carry >/=10# at shoulder height or higher with </= 4/10 pain to assist with ADLS (0/05/5407)   Time 6   Period Weeks   Status Achieved   PT LONG TERM GOAL #5   Title pt will improve her FOTO score to >/= 58 to demontrate improved function at discharge (04/04/2015)    Baseline 61   Time 6   Period Weeks   Status Achieved               Plan - May 27, 2015 1155    Clinical Impression Statement teaira croft reported having pain at a 6/10 in the neck following doing her HEP last night. upon further assessment she demonstrated increased tightness in the upper traps which with manual trigger point release and sub-occipital release she reported pain dropped to a 1/10. edcuated how pt can peform manual trigger point release at home using tools or tennis balls. she has improved her cervical mobilty and reports decreased headache incidence. she reports that she is able to maintain and progress her current level of function independently and will be discharged from physical therapy today.    PT Next Visit Plan discharged   PT Home Exercise Plan HEP review   Consulted and Agree with Plan of Care Patient          G-Codes - 05/27/15 1220    Functional  Assessment Tool Used FOTO    Functional Limitation Changing and maintaining body position   Changing and Maintaining Body Position Goal Status [phone]) At least 40 percent but less than 60 percent impaired, limited or restricted   Changing and Maintaining Body Position Discharge Status ([id number]) At least 20 percent but less than 40 percent impaired, limited or restricted      Problem List Patient Active Problem List   Diagnosis Date Noted  . Bilateral occipital neuralgia 11/23/2014  . Cervicogenic headache 11/23/2014  . Neck pain 11/23/2014  . OSA on CPAP 11/23/2014  . Sleep apnea 03/10/2014  . AP (abdominal pain) 02/07/2014  . History of diverticulitis 02/07/2014  . Generalized anxiety disorder 02/04/2014  . Labile hypertension   . Fibromyalgia   . Chest pain, midsternal 01/05/2014  . Cervical spondylolysis 12/24/2013  . CAD (coronary artery disease) 12/24/2013  . Hyponatremia 12/23/2013  . Hypertensive emergency 12/23/2013  . Acute encephalopathy 12/23/2013  . Bronchitis, chronic obstructive w acute bronchitis ([place]) 12/21/2013  . Diverticulosis of colon without hemorrhage 11/15/2013  . Hot flashes related to aromatase inhibitor therapy 07/27/2013  . Osteopenia 07/27/2013  . DJD (degenerative joint disease) 09/29/2012  . Breast cancer, Left 12/20/2010  . Leg pain 11/28/2010  . FATTY LIVER DISEASE 01/09/2010  . GASTRITIS 12/16/2007  . Allergic rhinitis 02/18/2007  . DIZZINESS, CHRONIC 02/18/2007  . Headache in back of head 02/18/2007  . Mixed hyperlipidemia 12/23/2006  . GLAUCOMA 12/23/2006  . Coronary atherosclerosis 12/23/2006  . Asthma 12/23/2006  . GERD 12/23/2006  . IRRITABLE BOWEL SYNDROME 12/23/2006  . Myalgia and myositis 12/23/2006   Starr Lake PT, DPT, LAT, ATC  05/10/2015  12:22 PM      [facility] [address] [place], [place], [address] Phone: [phone]   Fax:  [phone]  Name: RASHAE ROTHER MRN: [id number] Date of Birth: [birth year]   PHYSICAL THERAPY DISCHARGE SUMMARY  Visits from Start of Care: 11  Current functional level related to goals / functional outcomes: FOTO 39% limited   Remaining deficits: Intermittent tightness of bil upper traps secondary to over activation from keeping shoulders elevated during ADLS. Reported relief of pain with manual trigger point release and stretching.    Education / Equipment: HEP, theraband for strengthening, posture education, where to find back nobber for trigger points.   Plan: Patient agrees to discharge.  Patient goals were met. Patient is being discharged due to meeting the stated rehab goals.  ?????

## 2015-05-11 ENCOUNTER — Telehealth: Payer: Self-pay | Admitting: Gastroenterology

## 2015-05-11 ENCOUNTER — Telehealth: Payer: Self-pay | Admitting: Pulmonary Disease

## 2015-05-11 MED ORDER — DICYCLOMINE HCL 20 MG PO TABS: 20.0000 mg | ORAL_TABLET | Freq: Four times a day (QID) | ORAL | Status: DC | PRN

## 2015-05-11 NOTE — Telephone Encounter (Signed)
Called spoke with pt. Aware of recs below. RX for bentyl has been sent in. Nothing further needed

## 2015-05-11 NOTE — Telephone Encounter (Signed)
Patient reports that she has a "miserable " pain that stared this weekend.  She reports the pain was intermittent, but now is almost constant.  It is located just above the umbilicus.  She describes it as a "squeezing pain".  She also reports nausea and has not appetite. "I am miserable and I want to know what to do?"  She is advised that I don't have any openings this week and I recommend she see her primary care or proceed to the ED for evaluation.  She verbalized understanding.

## 2015-05-11 NOTE — Telephone Encounter (Signed)
Patient states she is having stomach pain, she is nauseated, states she doesn't feel like she has the flu. States she knows she has gallstones. She tried with GI but they didn't have anything this week and told her to call her PCP, Lenna Gilford for appt.   ------------------ Patient states that she is not vomiting.  Patient says that her "intestines do not feel right".  She ate something Saturday night that did not agree with her, she has been having discomfort every since. Patient is having pain around navel.  Not a sharp pain, but a squeezing, tightness feeling.  Patient having soft stools and having to go a lot.  Patient says that her bottom is raw and irritated from having to go to the bathroom so often. Patient states that she has Tramadol, but she has not taken any because she didn't know if it would be okay to take.  Dr. Lenna Gilford, please advise.  Pharmacy:  [facility] [place].  Allergies  Allergen Reactions  . Choline Fenofibrate Other (See Comments)     pt states INTOL to Trilipix w/ "thigh burning"  . Simvastatin Other (See Comments)     pt states INTOL to STATINS \\T \ refuses to restart  . Hctz [Hydrochlorothiazide]     Causes hyponatremia  . Hydrocodone     Nightmare after taking cough syrup w/hydrocodone  . Levaquin [Levofloxacin In D5w]     Elevated BP  . Adhesive [Tape] Rash  . Ceclor [Cefaclor] Rash  . Clarithromycin Rash  . Codeine Nausea Only  . Doxycycline Rash  . Lisinopril Cough    Developed ACE cough...  . Penicillins Itching and Rash    At injection site  . Tobramycin-Dexamethasone Rash   Current Outpatient Prescriptions on File Prior to Visit  Medication Sig Dispense Refill  . Acetaminophen (TYLENOL 8 HOUR PO) Take by mouth as directed.     [name] amLODipine (NORVASC) 5 MG tablet Take 1 tablet (5 mg total) by mouth daily. 30 tablet 6  . aspirin EC 81 MG tablet Take 81 mg by mouth daily.    . clobetasol (TEMOVATE) 0.05 % external solution Apply 1 application topically  2 (two) times daily.    . fexofenadine (ALLEGRA) 180 MG tablet Take 180 mg by mouth daily.      . fluocinonide (LIDEX) 0.05 % external solution Apply 1 application topically daily as needed (scalp itching).     . Glucosamine-Chondroit-Vit C-Mn (GLUCOSAMINE CHONDR 1500 COMPLX PO) Take 1 tablet by mouth 3 (three) times daily.      . hydrALAZINE (APRESOLINE) 50 MG tablet Take 0.5 tablets (25 mg total) by mouth 3 (three) times daily. 45 tablet 2  . hyoscyamine (ANASPAZ) 0.125 MG TBDP disintergrating tablet Place 0.125 mg under the tongue every 6 (six) hours as needed for bladder spasms or cramping.    [name] ketoconazole (NIZORAL) 2 % shampoo Apply 1 application topically 2 (two) times a week. As needed for itching a couple of times week    . latanoprost (XALATAN) 0.005 % ophthalmic solution Place 1 drop into both eyes at bedtime.     [name] letrozole (FEMARA) 2.5 MG tablet Take 1 tablet (2.5 mg total) by mouth daily. 90 tablet 3  . losartan (COZAAR) 100 MG tablet take 1/2 tablet by mouth three times a day 45 tablet 5  . magnesium oxide (MAG-OX) 400 MG tablet Take 400 mg by mouth daily. Reported on 02/14/2015    . metoprolol (LOPRESSOR) 50 MG tablet Take 1 tablet (50  mg total) by mouth 3 (three) times daily. 270 tablet 2  . mometasone (NASONEX) 50 MCG/ACT nasal spray Place 2 sprays into the nose daily as needed (congestion).     . nitroGLYCERIN (NITROSTAT) 0.4 MG SL tablet Place 1 tablet (0.4 mg total) under the tongue every 5 (five) minutes as needed for chest pain. 25 tablet 3  . Omega-3 Fatty Acids (FISH OIL) 1200 MG CAPS Take 1,200 mg by mouth daily. Reported on 02/14/2015    . omeprazole (PRILOSEC) 40 MG capsule take 1 capsule by mouth once daily 30 MINUTES BEFORE 1ST MEAL OF THE DAY 30 capsule 4  . ranitidine (ZANTAC) 300 MG capsule Take 1 capsule (300 mg total) by mouth every evening. 30 capsule 3  . traMADol (ULTRAM) 50 MG tablet Take 50 mg by mouth as directed.    . vitamin B-12 (CYANOCOBALAMIN) 1000  MCG tablet Take 1,000 mcg by mouth daily.       No current facility-administered medications on file prior to visit.

## 2015-05-11 NOTE — Telephone Encounter (Signed)
Per SN- Bentyl 20 mg #30 take 1 PO every 6 hours prn abdominal cramping/pain no refills, OTC Imodium 1 PO every 4 hours prn watery diarrhea, and Anusol HC cream 1 tube apply to rectal area prn. Thanks.

## 2015-05-11 NOTE — Telephone Encounter (Signed)
Phone line busy.

## 2015-05-15 NOTE — Telephone Encounter (Signed)
Patient will come in and see [name], PA tomorrow at 2:00 for continued abdominal pain

## 2015-05-15 NOTE — Telephone Encounter (Signed)
Patient called back stating that she did go to her primary care and was prescribed dicyclomine. She states that the abd pain is better but is having intestinal spasms and heart burn. Best # [phone].

## 2015-05-16 ENCOUNTER — Encounter: Payer: Self-pay | Admitting: Gastroenterology

## 2015-05-16 ENCOUNTER — Ambulatory Visit (INDEPENDENT_AMBULATORY_CARE_PROVIDER_SITE_OTHER): Payer: Medicare Other | Admitting: Gastroenterology

## 2015-05-16 VITALS — BP 122/58 | HR 68 | Ht <= 58 in | Wt 138.0 lb

## 2015-05-16 DIAGNOSIS — R197 Diarrhea, unspecified: Secondary | ICD-10-CM | POA: Diagnosis not present

## 2015-05-16 DIAGNOSIS — M797 Fibromyalgia: Secondary | ICD-10-CM | POA: Diagnosis not present

## 2015-05-16 DIAGNOSIS — M9901 Segmental and somatic dysfunction of cervical region: Secondary | ICD-10-CM | POA: Diagnosis not present

## 2015-05-16 DIAGNOSIS — R12 Heartburn: Secondary | ICD-10-CM | POA: Diagnosis not present

## 2015-05-16 DIAGNOSIS — R1084 Generalized abdominal pain: Secondary | ICD-10-CM | POA: Diagnosis not present

## 2015-05-16 DIAGNOSIS — M542 Cervicalgia: Secondary | ICD-10-CM | POA: Diagnosis not present

## 2015-05-16 DIAGNOSIS — R140 Abdominal distension (gaseous): Secondary | ICD-10-CM | POA: Diagnosis not present

## 2015-05-16 DIAGNOSIS — I2510 Atherosclerotic heart disease of native coronary artery without angina pectoris: Secondary | ICD-10-CM | POA: Diagnosis not present

## 2015-05-16 DIAGNOSIS — M9903 Segmental and somatic dysfunction of lumbar region: Secondary | ICD-10-CM | POA: Diagnosis not present

## 2015-05-16 MED ORDER — PANTOPRAZOLE SODIUM 40 MG PO TBEC: 40.0000 mg | DELAYED_RELEASE_TABLET | Freq: Every day | ORAL | Status: DC

## 2015-05-16 NOTE — Patient Instructions (Addendum)
Discontinue the Omeprazole 40 mg daily. Start the Pantoprazole Sodium 40 mg , Take 1 tablet daily. [facility] [place]  Start daily probiotic such as Publishing copy, culturelle, Electronics engineer.  We have given you samples of IB Gard for IBS.  Take 1 tab twice daily as needed.  Coupon provided.

## 2015-05-17 ENCOUNTER — Encounter: Payer: Medicare Other | Admitting: Physical Therapy

## 2015-05-23 ENCOUNTER — Encounter: Payer: Self-pay | Admitting: Gastroenterology

## 2015-05-23 DIAGNOSIS — R12 Heartburn: Secondary | ICD-10-CM | POA: Insufficient documentation

## 2015-05-23 DIAGNOSIS — R140 Abdominal distension (gaseous): Secondary | ICD-10-CM | POA: Insufficient documentation

## 2015-05-23 DIAGNOSIS — R197 Diarrhea, unspecified: Secondary | ICD-10-CM | POA: Insufficient documentation

## 2015-05-23 NOTE — Progress Notes (Signed)
     05/16/2015 ARMARI BECKEL [id number] [birth year]   History of Present Illness:  This is a 80 year old female who is known to Dr. Fuller Plan. She had colonoscopy in May 2016 at which time she was found have moderate diverticulosis in the sigmoid colon and 2 polyps that were removed and one was a tubular adenoma and the other was a hyperplastic polyp. She also had EGD performed at the same time, which was normal.  She has multiple medication allergies/intolerances.  She presents to the office today with complaints of generalized abdominal pain, bloating, and diarrhea. She was given Bentyl to use from her PCP and she says that this medication did work, but it made her feel weird and "drained her". She does use hyoscyamine for esophageal spasm and tolerates that, however.  She has actually been feeling better for the past few days.  She does have a history of diverticulitis, but says that the symptoms do not feel similar and she does not have localized left lower quadrant pain as she has with this in the past.  She also complains of some nausea and heartburn. She is currently on omeprazole 40 mg daily and Zantac 300 mg at bedtime.   Current Medications, Allergies, Past Medical History, Past Surgical History, Family History and Social History were reviewed in [employer] record.   Physical Exam: BP 122/58 mmHg  Pulse 68  Ht 4' 9.5" (1.461 m)  Wt 138 lb (62.596 kg)  BMI 29.33 kg/m2  LMP 11/26/1990 General: Well developed white female in no acute distress Head: Normocephalic and atraumatic Eyes:  Sclerae anicteric, conjunctiva pink  Ears: Normal auditory acuity Lungs: Clear throughout to auscultation Heart: Regular rate and rhythm Abdomen: Soft, non-distended.  Normal bowel sounds. Non-tender. Musculoskeletal: Symmetrical with no gross deformities  Extremities: No edema  Neurological: Alert oriented x 4, grossly non-focal Psychological:  Alert and cooperative.  Normal mood and affect  Assessment and Recommendations: -Abdominal pain, diarrhea:  Likely has some functional symptoms/IBS.  Improved on Bentyl, but stopped due to side effects.  She uses hyoscyamine for esophageal spasms and was advised that she can use this prn for her abdominal pains as well in place of the Bentyl.  Will also start a daily probiotic and will try IBgard BID prn for gas/bloating related to IBS. -GERD:  Will try changing her PPI from omeprazole 40 mg daily to pantoprazole 40 mg daily.  If changing her PPI does not help, may want to consider pH study.  *She will return to the office for any ongoing/recurrent symptoms.

## 2015-05-24 ENCOUNTER — Encounter: Payer: Self-pay | Admitting: Neurology

## 2015-05-24 ENCOUNTER — Ambulatory Visit (INDEPENDENT_AMBULATORY_CARE_PROVIDER_SITE_OTHER): Payer: Medicare Other | Admitting: Neurology

## 2015-05-24 VITALS — BP 120/68 | HR 69 | Ht <= 58 in | Wt 138.0 lb

## 2015-05-24 DIAGNOSIS — G4486 Cervicogenic headache: Secondary | ICD-10-CM

## 2015-05-24 DIAGNOSIS — I2510 Atherosclerotic heart disease of native coronary artery without angina pectoris: Secondary | ICD-10-CM | POA: Diagnosis not present

## 2015-05-24 DIAGNOSIS — R51 Headache: Secondary | ICD-10-CM

## 2015-05-24 NOTE — Progress Notes (Signed)
Reviewed and agree with management plan.  Malcolm T. Stark, MD FACG 

## 2015-05-24 NOTE — Patient Instructions (Signed)
1. Continue with home exercise program 2. If you need another session of dry needling, call PT and if they need an order, please let us know so we can send this in 3. Try using wrist splints to stabilize nerve in hand if causing a lot of symptoms 4. Follow-up in 1 year, call for any changes

## 2015-05-24 NOTE — Progress Notes (Signed)
NEUROLOGY FOLLOW UP OFFICE NOTE  [name] [id number]  HISTORY OF PRESENT ILLNESS: I had the pleasure of seeing [name] in follow-up in the neurology clinic on 05/24/2015.  The patient was last seen 3 months ago for chronic daily headaches suggestive of occipital neuralgia versus cervicogenic headaches. She describes burning pain in the bilateral occipital regions, as well a sharp shooting pain from her neck going up her head. No nausea, vomiting. She stopped gabapentin. She did not notice much improvement with occipital nerve blocks, but had good response to physical therapy, particularly dry needling. The tightness/band in her neck is still present, but much better. She reports tightening in her right shoulder going up her neck. Headaches depend on how tense the shoulder/neck are. She denies any falls. She reports occasional sensation of electricity in her hands. No weakness. She denies any dizziness, diplopia, bowel/bladder dysfunction.   HPI: This is a pleasant 80 yo RH woman with a history of hypertension, hyperlipidemia, CAD, breast cancer s/p lumpectomy, chemotherapy and radiation, with daily headaches. She had been seeing neurologist Dr. Krista Blue since 2014. Records were reviewed. [name] reports a history of migraines since her 44s. These quieted down, until 2014 when she started having headaches that she reports were different from her migraines in the past. It starts in the bilateral occipital region, her neck feels strange, "like it's about to break in the middle," then radiates to the frontal region like a tight band of pressure. This is associated with nausea. Her last migraine was 1 year ago. She was reporting 15 headaches days a month. MRI cervical spine had shown multilevel degenerative disc disease, mild right foraminal stenosis at C3-4 secondary to asymmetric right-sided facet hypertrophy and uncovertebral disease, facet hypertrophy and disc osteophyte complexes at C4-5  with mild central canal narrowing, mild to moderate left central canal stenosis C5-6 without significant foraminal disease, mild central and left foraminal stenosis at C6-7 secondary to a broad-based disc osteophyte complex and uncovertebral disease, mild left foraminal narrowing at C7-C1 secondary to facet disease. ESR normal. In 2015, she was admitted for hyponatremia and headaches. Headaches had increased in frequency, described as a burning and pressure sensation. Ultram would help some. She was noted to have elevated blood pressure during times of headaches. According to notes, on her last visit in August 2016, she had reported improvement in the headaches but still frequent, and was offered the option of an occipital nerve block.   She reports having the headaches constantly, waxing and waning in intensity from 3/10 to 10/10. She takes Tramadol or Arthritis Tylenol, applying ice helps. She chews ginger for the nausea. In the past, seeing her chiropractor used to help, but not anymore. She had PT for neck pain in 2015 and was told her neck was so tight. She feels slightly better lying down. She has had balance problems but denies any recent falls in the past year. She reports that her BP is overall better except this week when she has had more intense headaches. She started using a CPAP machine last June but has not noticed much change and still does not feel rested on awakening.   PAST MEDICAL HISTORY: Past Medical History  Diagnosis Date  . Diverticulosis of colon (without mention of hemorrhage)   . Irritable bowel syndrome   . Colonic polyp 04-27-2009    tubular adenoma  . Headache(784.0)     a. frequently assocaited with high BPs  . Breast cancer, Left 12/20/2010  NO BLOOD PRESSURE CHECKS OR STICKS IN LEFT ARM  . Gallstones   . CAD (coronary artery disease)     a. 01/19/2010 s/p CABG x 3, lima->lad, vg->diag, vg->om1;  b. 06/2011 :Lexi MV: EF 84%, No ischemia. c. 01/06/14 s/p negative  nuclear stress test with EF >70%  . Fibromyalgia   . Atrophic vaginitis   . GERD (gastroesophageal reflux disease)   . Hypercholesterolemia   . Glaucoma   . Cervical arthritis ([place])   . Hiatal hernia   . Labile hypertension   . Asthma     MEDICATIONS: Current Outpatient Prescriptions on File Prior to Visit  Medication Sig Dispense Refill  . Acetaminophen (TYLENOL 8 HOUR PO) Take by mouth as directed.     [name] amLODipine (NORVASC) 5 MG tablet Take 1 tablet (5 mg total) by mouth daily. 30 tablet 6  . aspirin EC 81 MG tablet Take 81 mg by mouth daily.    . clobetasol (TEMOVATE) 0.05 % external solution Apply 1 application topically 2 (two) times daily.    . fexofenadine (ALLEGRA) 180 MG tablet Take 180 mg by mouth daily.      . fluocinonide (LIDEX) 0.05 % external solution Apply 1 application topically daily as needed (scalp itching).     . Glucosamine-Chondroit-Vit C-Mn (GLUCOSAMINE CHONDR 1500 COMPLX PO) Take 1 tablet by mouth 3 (three) times daily.      . hydrALAZINE (APRESOLINE) 50 MG tablet Take 0.5 tablets (25 mg total) by mouth 3 (three) times daily. 45 tablet 2  . hyoscyamine (ANASPAZ) 0.125 MG TBDP disintergrating tablet Place 0.125 mg under the tongue every 6 (six) hours as needed for bladder spasms or cramping.    [name] ketoconazole (NIZORAL) 2 % shampoo Apply 1 application topically 2 (two) times a week. As needed for itching a couple of times week    . latanoprost (XALATAN) 0.005 % ophthalmic solution Place 1 drop into both eyes at bedtime.     [name] letrozole (FEMARA) 2.5 MG tablet Take 1 tablet (2.5 mg total) by mouth daily. 90 tablet 3  . losartan (COZAAR) 100 MG tablet take 1/2 tablet by mouth three times a day 45 tablet 5  . magnesium oxide (MAG-OX) 400 MG tablet Take 400 mg by mouth daily. Reported on 02/14/2015    . metoprolol (LOPRESSOR) 50 MG tablet Take 1 tablet (50 mg total) by mouth 3 (three) times daily. 270 tablet 2  . mometasone (NASONEX) 50 MCG/ACT nasal spray Place 2  sprays into the nose daily as needed (congestion).     . Omega-3 Fatty Acids (FISH OIL) 1200 MG CAPS Take 1,200 mg by mouth daily. Reported on 02/14/2015    . omeprazole (PRILOSEC) 40 MG capsule take 1 capsule by mouth once daily 30 MINUTES BEFORE 1ST MEAL OF THE DAY 30 capsule 4  . pantoprazole (PROTONIX) 40 MG tablet Take 1 tablet (40 mg total) by mouth daily. 30 tablet 2  . ranitidine (ZANTAC) 300 MG capsule Take 1 capsule (300 mg total) by mouth every evening. 30 capsule 3  . traMADol (ULTRAM) 50 MG tablet Take 50 mg by mouth as directed.    . vitamin B-12 (CYANOCOBALAMIN) 1000 MCG tablet Take 1,000 mcg by mouth daily.      . nitroGLYCERIN (NITROSTAT) 0.4 MG SL tablet Place 1 tablet (0.4 mg total) under the tongue every 5 (five) minutes as needed for chest pain. (Patient not taking: Reported on 05/24/2015) 25 tablet 3   No current facility-administered medications on file prior to  visit.    ALLERGIES: Allergies  Allergen Reactions  . Choline Fenofibrate Other (See Comments)     pt states INTOL to Trilipix w/ "thigh burning"  . Simvastatin Other (See Comments)     pt states INTOL to STATINS \\T \ refuses to restart  . Bentyl [Dicyclomine Hcl]     Made me feel weird and drained me  . Hctz [Hydrochlorothiazide]     Causes hyponatremia  . Hydrocodone     Nightmare after taking cough syrup w/hydrocodone  . Levaquin [Levofloxacin In D5w]     Elevated BP  . Adhesive [Tape] Rash  . Ceclor [Cefaclor] Rash  . Clarithromycin Rash  . Codeine Nausea Only  . Doxycycline Rash  . Lisinopril Cough    Developed ACE cough...  . Penicillins Itching and Rash    At injection site  . Tobramycin-Dexamethasone Rash    FAMILY HISTORY: Family History  Problem Relation Age of Onset  . Heart disease Brother   . Cancer Brother     gland cancer  . Diabetes Sister   . Breast cancer Sister   . Stroke Mother   . Stroke Father   . Colon cancer Neg Hx   . Stomach cancer Neg Hx   . Pancreatic cancer  Neg Hx   . Prostate cancer Father   . Kidney disease Neg Hx   . Liver disease Neg Hx     SOCIAL HISTORY: Social History   Social History  . Marital Status: Married    Spouse Name: Lorin Picket. Liskey  . Number of Children: 2  . Years of Education: 12   Occupational History  . part time preschool teacher-retired    Social History Main Topics  . Smoking status: Never Smoker   . Smokeless tobacco: Never Used  . Alcohol Use: No  . Drug Use: No  . Sexual Activity: Yes    Birth Control/ Protection: Post-menopausal   Other Topics Concern  . Not on file   Social History Narrative   Patient lives at home with her husband Maisie Fus). Patient is retired. Patient  Has 12 th grade education.    Caffeine- sometimes- One cup of coffee.   Right handed.   Four granddaughters.    REVIEW OF SYSTEMS: Constitutional: No fevers, chills, or sweats, no generalized fatigue, change in appetite Eyes: No visual changes, double vision, eye pain Ear, nose and throat: No hearing loss, ear pain, nasal congestion, sore throat Cardiovascular: No chest pain, palpitations Respiratory:  No shortness of breath at rest or with exertion, wheezes GastrointestinaI: No nausea, vomiting, diarrhea, abdominal pain, fecal incontinence Genitourinary:  No dysuria, urinary retention or frequency Musculoskeletal:  + neck pain, back pain Integumentary: No rash, pruritus, skin lesions Neurological: as above Psychiatric: No depression, insomnia, anxiety Endocrine: No palpitations, fatigue, diaphoresis, mood swings, change in appetite, change in weight, increased thirst Hematologic/Lymphatic:  No anemia, purpura, petechiae. Allergic/Immunologic: no itchy/runny eyes, nasal congestion, recent allergic reactions, rashes  PHYSICAL EXAM: Filed Vitals:   05/24/15 1506  BP: 120/68  Pulse: 69   General: No acute distress Head:  Normocephalic/atraumatic Neck: supple, + paraspinal tenderness, full range of motion Heart:   Regular rate and rhythm Lungs:  Clear to auscultation bilaterally Back: No paraspinal tenderness Skin/Extremities: No rash, no edema Neurological Exam: alert and oriented to person, place, and time. No aphasia or dysarthria. Fund of knowledge is appropriate.  Recent and remote memory are intact.  Attention and concentration are normal.    Able to name objects and repeat  phrases. Cranial nerves: Pupils equal, round, reactive to light.  Fundoscopic exam unremarkable, no papilledema. Extraocular movements intact with no nystagmus. Visual fields full. Facial sensation intact. No facial asymmetry. Tongue, uvula, palate midline.  Motor: Bulk and tone normal, muscle strength 5/5 throughout with no pronator drift.  Sensation to light touch intact.  No extinction to double simultaneous stimulation.  Deep tendon reflexes 2+ throughout, toes downgoing.  Finger to nose testing intact.  Gait narrow-based and steady, able to tandem walk adequately.  Romberg negative.  IMPRESSION: This is a pleasant 80 yo RH woman with a history of hypertension, sleep apnea, migraines, with chronic daily headaches over the occipital and neck regions. She did not have much response to occipital nerve blocks but had good response to PT and dry needling for neck pain, suggestive of cervicogenic headaches. She now notices headaches mostly occur when her neck is tense. She stopped gabapentin. Continue with HEP. If neck pain worsens, she will call PT for another session of dry needling, and will let us know if order needs to be sent. She reports a sensation of electricity in both hands, possibly carpal tunnel syndrome, she will start using wrist splints. She will follow-up in 1 year and knows to call for any changes.   Thank you for allowing me to participate in her care.  Please do not hesitate to call for any questions or concerns.  The duration of this appointment visit was 24 minutes of face-to-face time with the patient.  Greater than  50% of this time was spent in counseling, explanation of diagnosis, planning of further management, and coordination of care.   Ellouise Newer, M.D.   CC: Dr. Lenna Gilford

## 2015-05-25 DIAGNOSIS — J301 Allergic rhinitis due to pollen: Secondary | ICD-10-CM | POA: Diagnosis not present

## 2015-05-25 DIAGNOSIS — J3089 Other allergic rhinitis: Secondary | ICD-10-CM | POA: Diagnosis not present

## 2015-05-30 DIAGNOSIS — M542 Cervicalgia: Secondary | ICD-10-CM | POA: Diagnosis not present

## 2015-05-30 DIAGNOSIS — M9901 Segmental and somatic dysfunction of cervical region: Secondary | ICD-10-CM | POA: Diagnosis not present

## 2015-05-30 DIAGNOSIS — M9903 Segmental and somatic dysfunction of lumbar region: Secondary | ICD-10-CM | POA: Diagnosis not present

## 2015-05-30 DIAGNOSIS — M545 Low back pain: Secondary | ICD-10-CM | POA: Diagnosis not present

## 2015-06-13 DIAGNOSIS — M545 Low back pain: Secondary | ICD-10-CM | POA: Diagnosis not present

## 2015-06-13 DIAGNOSIS — M9903 Segmental and somatic dysfunction of lumbar region: Secondary | ICD-10-CM | POA: Diagnosis not present

## 2015-06-13 DIAGNOSIS — M9901 Segmental and somatic dysfunction of cervical region: Secondary | ICD-10-CM | POA: Diagnosis not present

## 2015-06-13 DIAGNOSIS — M542 Cervicalgia: Secondary | ICD-10-CM | POA: Diagnosis not present

## 2015-06-15 DIAGNOSIS — J301 Allergic rhinitis due to pollen: Secondary | ICD-10-CM | POA: Diagnosis not present

## 2015-06-15 DIAGNOSIS — J3089 Other allergic rhinitis: Secondary | ICD-10-CM | POA: Diagnosis not present

## 2015-06-20 ENCOUNTER — Ambulatory Visit (INDEPENDENT_AMBULATORY_CARE_PROVIDER_SITE_OTHER)
Admission: RE | Admit: 2015-06-20 | Discharge: 2015-06-20 | Disposition: A | Discharge status: Home / Self Care | Payer: Medicare Other | Source: Ambulatory Visit | Attending: Pulmonary Disease | Admitting: Pulmonary Disease

## 2015-06-20 ENCOUNTER — Other Ambulatory Visit (INDEPENDENT_AMBULATORY_CARE_PROVIDER_SITE_OTHER): Payer: Medicare Other

## 2015-06-20 ENCOUNTER — Ambulatory Visit (INDEPENDENT_AMBULATORY_CARE_PROVIDER_SITE_OTHER): Payer: Medicare Other | Admitting: Pulmonary Disease

## 2015-06-20 ENCOUNTER — Encounter: Payer: Self-pay | Admitting: Pulmonary Disease

## 2015-06-20 VITALS — BP 140/70 | HR 82 | Temp 97.9°F | Ht <= 58 in | Wt 138.8 lb

## 2015-06-20 DIAGNOSIS — C50519 Malignant neoplasm of lower-outer quadrant of unspecified female breast: Secondary | ICD-10-CM

## 2015-06-20 DIAGNOSIS — I10 Essential (primary) hypertension: Secondary | ICD-10-CM

## 2015-06-20 DIAGNOSIS — K581 Irritable bowel syndrome with constipation: Secondary | ICD-10-CM

## 2015-06-20 DIAGNOSIS — J45909 Unspecified asthma, uncomplicated: Secondary | ICD-10-CM

## 2015-06-20 DIAGNOSIS — K219 Gastro-esophageal reflux disease without esophagitis: Secondary | ICD-10-CM

## 2015-06-20 DIAGNOSIS — I2510 Atherosclerotic heart disease of native coronary artery without angina pectoris: Secondary | ICD-10-CM | POA: Diagnosis not present

## 2015-06-20 DIAGNOSIS — E782 Mixed hyperlipidemia: Secondary | ICD-10-CM

## 2015-06-20 DIAGNOSIS — I2583 Coronary atherosclerosis due to lipid rich plaque: Secondary | ICD-10-CM

## 2015-06-20 DIAGNOSIS — J301 Allergic rhinitis due to pollen: Secondary | ICD-10-CM

## 2015-06-20 DIAGNOSIS — G4733 Obstructive sleep apnea (adult) (pediatric): Secondary | ICD-10-CM

## 2015-06-20 DIAGNOSIS — F411 Generalized anxiety disorder: Secondary | ICD-10-CM

## 2015-06-20 DIAGNOSIS — J4520 Mild intermittent asthma, uncomplicated: Secondary | ICD-10-CM

## 2015-06-20 DIAGNOSIS — R0989 Other specified symptoms and signs involving the circulatory and respiratory systems: Secondary | ICD-10-CM

## 2015-06-20 DIAGNOSIS — K5730 Diverticulosis of large intestine without perforation or abscess without bleeding: Secondary | ICD-10-CM

## 2015-06-20 DIAGNOSIS — Z9989 Dependence on other enabling machines and devices: Secondary | ICD-10-CM

## 2015-06-20 DIAGNOSIS — R05 Cough: Secondary | ICD-10-CM | POA: Diagnosis not present

## 2015-06-20 LAB — CBC WITH DIFFERENTIAL/PLATELET
BASOS PCT: 0.7 % (ref 0.0–3.0)
Basophils Absolute: 0.1 10*3/uL (ref 0.0–0.1)
EOS PCT: 3.3 % (ref 0.0–5.0)
Eosinophils Absolute: 0.2 10*3/uL (ref 0.0–0.7)
HCT: 38.6 % (ref 36.0–46.0)
HEMOGLOBIN: 13.2 g/dL (ref 12.0–15.0)
LYMPHS ABS: 3.1 10*3/uL (ref 0.7–4.0)
Lymphocytes Relative: 42.2 % (ref 12.0–46.0)
MCHC: 34.2 g/dL (ref 30.0–36.0)
MCV: 90.3 fl (ref 78.0–100.0)
MONO ABS: 0.8 10*3/uL (ref 0.1–1.0)
MONOS PCT: 11.2 % (ref 3.0–12.0)
Neutro Abs: 3.1 10*3/uL (ref 1.4–7.7)
Neutrophils Relative %: 42.6 % — ABNORMAL LOW (ref 43.0–77.0)
Platelets: 305 10*3/uL (ref 150.0–400.0)
RBC: 4.28 Mil/uL (ref 3.87–5.11)
RDW: 14.3 % (ref 11.5–15.5)
WBC: 7.4 10*3/uL (ref 4.0–10.5)

## 2015-06-20 LAB — COMPREHENSIVE METABOLIC PANEL
ALT: 23 U/L (ref 0–35)
AST: 21 U/L (ref 0–37)
Albumin: 4.6 g/dL (ref 3.5–5.2)
Alkaline Phosphatase: 80 U/L (ref 39–117)
BUN: 11 mg/dL (ref 6–23)
CHLORIDE: 96 meq/L (ref 96–112)
CO2: 31 meq/L (ref 19–32)
CREATININE: 0.62 mg/dL (ref 0.40–1.20)
Calcium: 9.9 mg/dL (ref 8.4–10.5)
GFR: 99 mL/min (ref >60.00)
Glucose, Bld: 80 mg/dL (ref 70–99)
Potassium: 4.5 mEq/L (ref 3.5–5.1)
SODIUM: 134 meq/L — AB (ref 135–145)
Total Bilirubin: 1.2 mg/dL (ref 0.2–1.2)
Total Protein: 7.5 g/dL (ref 6.0–8.3)

## 2015-06-20 LAB — TSH: TSH: 1.3 u[IU]/mL (ref 0.35–4.50)

## 2015-06-20 NOTE — Progress Notes (Signed)
Subjective:    Patient ID: [name], female    DOB: 09-14-1935, 80 y.o.   MRN: [id number]  HPI 80 y/o WF here for a follow up visit... she has multiple medical problems including AR & Asthma;  HBP;  CAD followed by [name] & s/p 3 vessel CABG 11/11 by DrGearhardt;  Hyperlipidemia followed in the [facility];  GERD/ IBS/ Divertics;  Breast Cancer diagnosed 12/12;  Fibromyalgia;  Chr HAs & dizziness...  SEE PREV EPIC NOTES FOR OLDER DATA >>    EKG 9/13 showed NSR, rate64, NSSTTWA, NAD...   CXR 10/13 showed heart at upper lim of norm, prior CABG, mild hyperinflation, clear, NAD...  Abd Ultrasound 10/13 by [name] for eval CP showed> norm GB, liver, kidneys, & Ao- neg sonar...  LABS 10-12/13 showed Chems- wnl;  CBC- wnl  LABS 2/14:  FLP- ok x LDL=110;  TSH=1.33   CXR 4/14 showed norm heart size, s/p CABG, tort thor Ao, bilat apical pleural scarring, NAD.[name].  ~  February 03, 2013:  60moROV & PFraser Dinhad Rheum consult DKathlynn Grate last seen 9/14 & note reviewed> neck pain, this exac HAs/migraines, dizzy/ light headed; MRI=> NS consult & not a surg cand & NSAIDs/ PT w/ min benefit; saw Neurology- tried Pamelor but feels hung-over; for her DDD & OA they rec Flex'5mg'$ Tid which has helped some & she requests refill today- OK...     She saw [name] 9/14 after ER visit for HA> hx migraines and mixed HAs; CT Brain was neg & MRI CSpine showed multilevel DDD & some foraminal stenosis; Labs were neg including CRP & Sed; they tried Nortrip & Mobic but only min benefit...    She had routine f/u [name] 10/14> Hx left breast cancer dx 10/12 & treated w/ lumpectomy, XRT, & now Femara; exam neg, f/u mammogram neg, rec f/u 196yr. We reviewed prob list, meds, xrays and labs> see below for updates >> BP remains well controlled on her 3 meds; she had the 2014 flu vaccine 10/14; she continues on allergy shots at the [facility]...  ~  August 11, 2013:  36m24moV & PatFraser Dinports that she is stable overall just lack energy so she's  eating extra protein to help w/ this she tells me... She has had numerous specialty follow up visits in the interval>>    She had an allergy f/u w/ DrVanWinkle 3/15> AR on allergy shots, Allegra, Flonase, Saline; she is stable w/o recent exacerbation...    Hx Asthma on XopenexHFA as needed; she has done well w/o asthma exac, cough, sput, wheezing, or SOB...     She had a Cards f/u w/ drNishan 1/15> HBP, CAD, HL; on ASA81, Metop50Bid, Hyzaar100-12.5, Imdur30; Hx neg Myoview 5/13 & doing satis- no changes made; BP today= 136/68 & she denies CP, palpit, SOB, dizzy, edema, etc...    Lipids controlled on diet alone (w/ FishOil & CoQ10); Last FLP was 2/14 showing TChol 194, TG 143, HDL 55, LDL 110; reminded of diet, exercise, & need for Fasting blood work...    She has seen DrHawkes for rheum and DrHirsh for NS in the past> told to f/u prn only...    She had a Neuro f/u 1/15> HAs followed by [name]; neg CTBrain & DDD on MRI Cspine; she refused to take Pamelor & uses Tramadol/ Flexeril as needed; no change in meds needed...     She saw Oncology 6/15 for f/u of her left Breast Cancer> on Femara2.5 & tol well, s/p lumpectomy & sentinel  node bx 12/12, followed by XRT finished 3/13, & on Letrozole since then, no known recurrence...  We reviewed prob list, meds, xrays and labs> see below for updates >>   ~  December 21, 2013:  105moROV & add-on requested for bronchitic exacerbation> [name], chest congestion, hoarse voice 7 min sore throat ~4d ago; she denied f/c/s but has chest soreness from the coughing 7 bringing up sm amt of yellow-green sput; of note she was recently (3wks ago) treated for a UTI (Proteus sens Cipro) & this resolved... Exam shows borderline BP= 160/78, otherw stable VS w/ O2sat95% on RA, sl pharyngeal erythema, no exudates, ears neg, no adenopathy, hoarse voice, chest exam w/ bilat rhonchi & exp wheezing... CXR shows norm heart size, s/pCABG, clear lungs w/o  infiltrates... We discussed Rx w/ Depo80, Levaquin500 x7d, Mucinex 600-2Bid, Fluids, MMW & Delsym prn...     We reviewed prob list, meds, xrays and labs> see below for updates >>   CXR 10/15 showed normal heart size, s/p CABG, unchanged biapical pleural scarring, otherw clear lungs w/o infiltrates, NAD...   ~  December 30, 2013:  1wk ROV & post hosp check>  [facility] w/ HA, HBP, & hyponatremia (Na=119 ?etiology)> she had recently been treated for bronchitic exac w/ Levaquin & Mucinex, she thinks the HBP was a reaction "allergy" to Levaquin & doesn't want to take this antibiotic again; BP was 220 range in ER &treated w/ Cardene drip=> she was actually disch on less meds than PTA=> sdhe returned to ER the day after disch w/ HBP & BP ~200 & she hadn't taken her Losartan yet, given IV hyralazine & BP improved to the 160s; now on Metop50Bid & Cozaar50 (prev on Hyzaar100-12.5); they suspected her diuretic in her hyponatremia which corrected w/ IVF; she has a hx of HAs from DJD/neck and FM; We reviewed the following medical problems during today's office visit >>     AR/ Asthma>  not on any inhaled meds at present; denies cough, sput, ch in dyspnea, etc; she has Allegra, Nasonex, Mucinex, Tessalon, MMW for her various symptoms...    HBP>  on Metop50Bid & Losar50 now w/ BP=146/82 today; denies visual changes, CP, palipit, dizziness, syncope, dyspnea, edema, etc; we decided to incr Losartan to 100 & add Hydralazine50-1/2Bid w/ ROV 2 wks...    CAD>  CP 11/11=> cath w/ in-stent restenosis, then CABG x3 by DrGearhardt; re-hospitalized 6/12 by Cards for CP> cath showed severe 3 vessel CAD w/ 3/3 grafts patent & norm LVF w/ EF= 65-70%; Myoview 5/13 showed no ischemia & EF>80%; no change in meds- on Imdur30; & DrNishan noted that med rx is lim by side effects.    Hyperlipid>  Intol to statins; [facility] on Cres10- 2d per week reviewed but she was unable to tolerate even this low  dose; off all meds on diet alone now...    GI- GERD, Divertics> on Omep40 + Levsin0.125 & Align; followed by DrPatterson & seen 9/13 f/u for ?diverticulitis- improved after Cipro/Flagyl but CTAbd was neg x diverticulosis; he rec high fiber diet & Citrucel...    Hx left breast cancer> she had f/u Oncology 12/13- on Femara2.5 & tol ok; last mammogram 10/13 was ok as well...    Others>  she has persistant FM symptoms and chr HAs, uses Tramadol & Advil Prn... We reviewed prob list, meds, xrays and labs> see below  for updates >>   CXR 10/15 showed fe chr changes in lungs, norm heart size & s/p CABG, DJD in spine, NAD.Marland KitchenMarland Kitchen  EKG 11/15 showed NSR, rate69, NSSTTWA, NAD...  Lab cumulative summary sheet reviewed...  PLAN>> we decided to incr Losartan to '100mg'$ /d and add Hydralazine50-1/2Bid, continue Metop50Bid and the Imdur30 as before; she knows to aqvoid excess sodium in her diet (esp now that hyponatremia resolved & she's off diuretic; ROV 2wks...    ~  January 14, 2014:  2wk ROV & another hospitalization in the interval> [name] has had a continuing saga of HA & HBP, she has been to the ER, to Cardiology, Hospitalized, back to Cardiology, and almost daily phone calls for HA & HBP w/ BP ~200 sys> all notes reviewed;  Cards eval for secondary cause of the HBP has been neg;  Initially it was assumed that incr BP resulted in HBP but she has a hx of chronic daily HAs and she continues to have HAs when her BP is controlled, and it seems more likely that the pain contributes to her HBP;  She was last Hosp 11/11 - 01/06/14 and disch on ASA81, Imdur60, Hydralazine50Tid, Losar100-1/2Bid, Metop50Bid;  BP at disch was 100/47;  She saw [name] 11/16 on these meds w/ BP recorded 130/64, Imdur was stopped due to her HAs, they plan f/u 56mo  She has called daily since then w/ elev BP (esp at night she says) and we have rec strategies using her current meds to help her headaches and adjust BP meds:    HBP> Rec to adjust meds-  take Metop50Tid, Losartan100-1/2Tid, Apresoline50- take 1/2 to 1tab Tid (she wants to do this since BP is low in AM & she feels weak, with BP elev in PM & she needs more)...    Chronic Daily HAs> she needs f/u appt w/ Neurology w/ their active management of this problem; she has Zanaflex'2mg'$  & Cyclobenzaprine'5mg'$ - asked to try one of these taken Tid & if not improved then try the other Tid; rest, use heating pad on neck, and take Tramadol/ Tylenol up to 3 times daily as needed... We reviewed prob list, meds, xrays and labs> see below for updates >>   CXR 11/15 showed norm heart size, prev CABG, clear lungs- some hyperinflation/ NAD, DJD Tspine...  Renal Sonar 11/15 was wnl...  CT Head 11/15 showed cortical atrophy and small vessel dis, sinuses clear, NAD..Marland KitchenMarland Kitchen  EKG 11/15 showed NSR, rate64, borderline tracing w/?LAE & min NSSTTWA...  2DEcho 11/15 showed norm cavity size & thickness, norm EF= 60-65%, norm wall motion, norm diastolic function, essentially norm valves...   Myoview 11/15 showed low risk scan w/ abn EKG portion but no stress induced perfusion defects, hyperdynamic LVF w/ EF>70% & no regional wall motion abn  LABS 11/15:  Chems- wnl w/ Cr=0.6;  CBC- ok w/ Hg= 12.3;  TSH=1.60 PLAN>>  take Metop50Tid, Losartan100-1/2Tid, Apresoline50- take 1/2 to 1tab Tid (she wants to do this since BP is low in AM & she feels weak, with BP elev in PM & she needs more); needs f/u appt w/ Neurology w/ their active management of this problem; she has Zanaflex'2mg'$  & Cyclobenzaprine'5mg'$ - asked to try one of these taken Tid & if not improved then try the other Tid; rest, use heating pad on neck, and take Tramadol/ Tylenol up to 3 times daily as needed...  ~  February 04, 2014:  3wk ROV & PFraser Dinreports improved overall;  BP is better regulated but still sl volatile &  she like the ability to adjust the Hydralazine> on Metop50Tid, Cozaar100- 1/2 tid, & Hydralazine50- 1/2to1Tid;  BP today= 144/78 and when it was up>160/90  last PM she took an extra '25mg'$  dose...  She saw Neuro [name] for her chronic daily HAs 11/15 and she agreed w/ our Rx for BP control + Muscle relaxer (she prefers the Zanaflex over the Flexeril) + Tramadol/ Tylenol for pain...  She is c/o a jitteriness/ shaking on the inside feeling and we discussed trying a regular medication to try to help elim this sensation> try KLONOPIN 0.'5mg'$ - 1/2 to 1 tab Bid... She will f/u in 10mo& call sooner prn problems...  ADDENDUM>> she never filled the Klonopin and refuses anxiolytic meds.  ~  April 13, 2014:  268moOV & PaFraser Dinas had numerous medical follow up visits over the last 63m47mo  She saw GI 12/15 w/ lower abd pain & nausea similar to prev bouts of diverticulitis in past;  CT Abd&Pelvis showed mild sigmoid diverticulitis, no evid of abscess or complications;  She was treated w/ Cipro & Flagyl & improved;  She is maintained on Prilosec, Anaspaz, fiber & metamucil...     She saw Neuro, [name] 1/16 due to headaches> long hx migraines, recurrent prob since 2014, now 15/30 days per month, CT Brain was neg x atrophy & sm vessel dis, MRI Cspine w/ multilevel DDD etc; treated w/ Tramadol, Flexeril, Zanaflex and [name] has ordered a Sleep Study- still pending & sched at end of this month...     She also saw DrNishan for CARDS f/u 1/16> hx CAD, CABG 2011, HBP, HLD; Adm 10/15 w/ HBP & hyponatremia (Na=119), HCTZ was stopped, BP meds adjusted & now controlled on Metoprolol50Tid, Losar100-1/2Tid, Apres50-1/2Tid; BP today= 136/80, improved & she denies recent CP, palpit, dizzy, ch in SOB, edema;  2DEcho 11/15 showed norm LVF w/ EF=60-65% & no regional wall motion abn, trivial AI, otherw wnl; Lexiscan Myoview 11/15 showed low risk scan w/ abn EKG portion but no ischemic perfusion defects & hyperdynamic LV w/ EF=70% & no wall motion abn... We reviewed prob list, meds, xrays and labs> see below for updates >> we gave her the PREVNAR-13 today... [facility]>> she will continue her same meds and plan f/u in ~44mo66mofasting blood work at that time...   ~  May 03, 2014:  3wk ROV & pt called requesting urgent add-on appt for recurrent BP problems> Pt notes that BP has been well controlled on her regimen of low sodium, Metoprolol50Tid, Hydralazine50-1/2Tid, and Losartan100-1/2Tid> until 3d ago when she had the onset of another severe HA- assoc w/ the severe pain she noted that her BP was once again elevated (190-230/ 100-110 she says) & the usual doses of the 3 meds didn't bring it down; she tried taking the Tramadol50 as directed by [name] & added Tylenol plus her musc relaxers Flexeril & Zanaflex but the HA persisted; when she called [name]'s office she was directed to call us f[place] an appt because of the BP... NOTE> it has been determined by her Cardiologist DrNishan & I concur that in her case it's the HA PAIN that causes her BP to spike, and relief of the pain is paramount to controlling the BP in these instances...  In this regard she is directed to maintain a baseline BP regimen w/ Metoprolol50Tid, Apres50-1/2Tid & Losar100-1/2Tid;  But when she has a HA she will immediately start her Neurology action plan for  her severe headaches, and check her BP Tid w/ adjustment of her meds up to one of each tab Tid until the HA is resolved, then return to her baseline regimen... We reviewed this plan in detail & she will let us know if she has any questions...   CXR 3/16 showed norm heart size, s/p CABG, clear lungs, NAD.Marland KitchenMarland Kitchen   EKG 3/16 showed SBrady, rate53, NSSTTWA, NAD... PLAN>> continue baseline BP med regimen w/ METOPROLOL50Tid, APRESOLINE50-1/2Tid & LOSARTAN100-1/2Tid; continue to monitor BP at home; during episodes of severe HA pain- follow Neurologists HA action plan for control of the discomfort; in addition start to monitor BP 3 times daily & increase BP meds to 1 of each Tid until the HA is controlled & the BP returns to normal; then resume the  baseline regimen...  ~  August 16, 2014:  3-43mo ROV & [name] has continued w/ her regular array of medical specialist follow up visits> SEE BELOW    Asthma & allergies controlled on Allegra & Nasonex, no asthma attacks etc...    BP is doing better on current Metop50Tid, Losar100-1/2Tid, Apres50-1/2Tid w/o any elev readings at home...     She has had several follow up visits w/ GI> hx GERD, Divertics, IBS, Polyps> last bout of diverticulitis was 12/15 treated w/ Cipro & Flagyl; she recurrent pain 3/16- seen in ER where CT was neg but she improved w/ Antibiotic rx; she continues on Omep & they added Ranitadine; she's been on Levsin & they added benefiber; she had Ba swallow (neg), EGD (wnl) & Colon (divertics and 2 sessile polyps) by DrStark...     She saw her oncologist, DrMagrinat 6/16 for f/u of her breast cancer> Dx 10/12 & treated w/ lumpectomy & sentinel node bx, followed by XRT, the Femara for a planned 69yrs (2 more yrs to go); they follow her BMDs etc...     She has a small cyst on her right index finger & I have suggested an Ortho/Hand eval, "I might want to see Rheumatology again" she says...    She's had an extensive Neuro eval from [name] & had a Neurology 2nd opinion consult 5/16 w/ DrDKirby at [facility] in [place]-  "it's my neck" and she states nothing new was discovered "they didn't find anything"; she has Tramadol & Tylenol for pain... We reviewed prob list, meds, xrays and labs> see below for updates >>   Barium Swallow was neg- norm motility, no HH, etc...  EGD 5/16 by DrStark showed essentially normal endoscopy; rec to continue antireflux regimen & PPI Rx...  Colonoscopy 5/16 by DrStark showed diverticulosis, 2 sessile polyps (one tub adenoma, the other hyperplastic)...  LABS 6/16: Chems- ok x BS=147;  CBC- wnl;   PLAN>>  She is rec to continue her current med regimen and follow up w/ her specialists...  ~  December 20, 2014:  46mo ROV & [name] indicates that she is the same,  maintaining her regular f/u visits w/ various medical specialists>    Asthma & allergies controlled on Allegra & Nasonex, no asthma attacks etc & breathing is OK, she's been walking for exercise...    BP is doing better on current Metop50Tid, Losar100-1/2Tid, Apres50-1/2Tid w/o any elev readings at home; she saw DrNishan for Cards 8/16> HBP, CAD, CABG 2011, HL; nonischemic myoview2015, BP controlled, intol to statins.     She has had several follow up visits w/ GI> hx GERD, Divertics, IBS, Polyps> on Omep40, Zantac300, Anaspaz0.125 prn; last bout of diverticulitis was 12/15 treated  w/ Cipro & Flagyl; she had recurrent pain 3/16- seen in ER where CT was neg but she improved w/ Antibiotic rx; she had Ba swallow (neg), EGD (wnl) & Colon (divertics and 2 sessile polyps) by DrStark...     She saw her oncologist, DrMagrinat 6/16 for f/u of her breast cancer> Dx 10/12 & treated w/ lumpectomy & sentinel node bx, followed by XRT, the Femara for a planned 23yr (2 more yrs to go); they follow her BMDs etc...     She had routine GYN check by PatGrubb 7/16> note reviewed...    She has a small cyst on her right index finger & I have suggested an Ortho/Hand eval, "I might want to see Rheumatology again" she says...    She's had an extensive Neuro eval from [name] & [facility] [place]  "it's my neck" and she states nothing new was discovered "they didn't find anything"; she has Tramadol & Tylenol for pain=> f/u [name] 8/16 & offered occipital nerve blocks; she had a 3rd opinion from DKersey9/16> note reviewed. Chr daily HAs (occip neuralgia vs cervicogenic HAs), rec occip nerve blocks & PT, Gabapentin and f/u planned.    She also saw DrAthar for a Sleep Eval split night study 04/25/14 w/ AHI=16/hr w/ desat to 85% reported; she was titrated to CPAP 6cm H2O w/ AHI=0 and O2sat nadir=96%, snoring was eliminated; download from 8/16 looked good but she reported some  mask issues which they are working on; she notes feeling sl better on the days that she wears the CPAP but HAs are about the same... We reviewed prob list, meds, xrays and labs> see below for updates >> given 2016 FLU vaccine today...  Vit D level 7/16 reported at 29 & she is rec to increase Vit D supplement to ~2000u daily... IMP/PLAN>>  PFraser Dinis stable on her current regimen; multisystem disease w/ numerous specialists tending to her needs and their systems; she has had several opinions from Neurologists regarding her chr daily HAs and cervicogenic HAs, she will f/u w/ DrTat regarding treatment options...   ~  June 20, 2015:  697moOV & PaFraser Dinontinues to have mult somatic complaints- weakness, not feeling well, chest pressure, cough w/ thick white sput, occas wheezing noted, and increased anxiety/jittery (esp w/ the Ventolin prescribed by drVanWinkle);  She states that her BP has been low & therefore she doesn't take all of her meds everyday, but this makes it hard for usKoreao adjust Rx since she does not keep detailed records of BP & wheat she is taking & when... We reviewed her interval specialist visits>>    Allergy- DrVanwinkle 04/27/15>  AR, asthma, GERD- on immunotherapy for >1568yrAllegra180, flonase, Nasonex, Saline, VentolinHFA, Omeprazole; FENO=27ppb 7 pt rec to f/u 24yr89yr   Cards- DrNishan 03/10/15>  HBP, CAD, CABG 2011, HL (intol to statins & refuses Rx);  She was Adm over halloween w/ hypertensive emergency (SBP>220) and hyponatremia (Na=119), HCT was stopped & she was suspected of taking pseudophed, Myoview & Echo were neg, mult med adjustments over that time-  Appears stable on ASA81, Metop50Tid, Amlod5, Losar100, Apres50-1/2Tid; BP today= 140/70 & she is doing better overall...     GI- JZehr 05/16/15>  Abd pain, heartburn, bloating, diarrhea; felt to have functional pain & IBS; EGD 5/16 was wnl; Colon 5/16 w/ mod divertics, & tub adenoma removed; on Omep40 (changed to Protonix40), Zantac300, rec  to add Probiotic daily &  use Levsin prn...    She remains on Femara 2.'5mg'$ /d from DrMagrinat- last seen 07/2014 7 note reviewed...    Outpt Rehab- mult visits (11) and disch 05/10/15>  Neck pain, musc spasm, decr ROM Cspine, abn posture; goals met & pt improved...    Neuro- DrJaffe 02/28/15>  Occipital neuralgia, given occipital nerve block w/ Maracaine & Kenalog...     DrAthar 04/26/15>  OSA on CPAP- good compliance documented, AHI on CPAP<1 on CPAP 6 w/ min leak; she gets her supplies from [place]...  DrAquino 05/24/15>  F/u chr daily HAs, pt says occip nerve blocks w/o benefit, but she reports that PT helped (esp dry needling) & rec to continue home exercise program. EXAM shows Afeb, VSS, O2sat=96% on RA;  HEENT- neg, mallampati2;  Chest- clear w/o w/r/r;  Heart- RR Gr1/6SEM w/o r/g;  Abd- soft, nontender, norm BS, neg;  Ext- w/o c/c/e;  Neuro- sl decr ROM neck, +trigger pts, otherw neg.  CXR 06/20/15>  Borderline heart size, s/p CABGsl hyperinflation, clear lungs- NAD...  Spirometry 06/20/15>  FVC=1.78 (90%), FEV1=1.26 (84%), %1sec=71%, mid-flows were sl reduced at 59% predicted...   LABS 06/20/15>  Chems- wnl;  CBC- wnl;  TSH=1.30... IMP/PLAN>>  [name] is well attended by her mult specialists and is stable to improved overall;  She is rec to continue her current meds, get plenty of exercise, and avoid stress... we plan recheck in 110mo..           Problem List:  GLAUCOMA (ICD-365.9) - she is sched for right cataract surg by DrBevis in Feb...  ALLERGIC RHINITIS (ICD-477.9) - she uses Allegra & Nasonex Prn; on Allergy shots weekly x yrs... ~  [facility] she remains on allergy shots...  ASTHMA & Asthmatic Bronchitis >> off Symbicort & using XOPENEX HFA as needed rescue inhaler... ~  5/11:  notes incr chest symptoms in the heat & rec to take the Symbicort 2sp Bid... ~  2012:  After her CABG & repeat hosp by Cards she is noted to be  off her inhalers... ~  10/12:  Treated w/ Zpak, Pred, Mucinex, plus her Symbicort80 & improved... ~  CXR 5/13 showed s/p CABG, clear lungs, NAD (she went to the Er w/ cough & had neg eval). ~  7/13:  DrVanWinkle stopped her Symbicort & switched to [facility]FA as needed... ~  2/14: she does not list any inhaled meds at present & states breathing OK w/o exac... ~  4/14: she was hosp after pill asp w/ asthma exac & improved w/ supportive Rx, didn't require bronch, CXR was clear, grad improved post hosp... ~  CXR 4/14 showed normal heart size, s/p CABG, sl tort Ao, bilat apic pleural scarring, NAD...  ~  6/15: she is stable w/o asthma exac and has avoided URIs/ bronchitis/ etc... ~  10/15: presented w/ URI/ bronchitic exac w/ cough, yellow-green sput, congestion, hoarseness, etc; CXR w/o signs of pneumonia & treated w/ Depo, Levaquin, Mucinex, MMW, Delsym, etc...  ~  CXR 10/15 showed few chr changes in lungs, norm heart size & s/p CABG, DJD in spine, NAD  HYPERTENSION (ICD-401.9) - controlled on TOPROL '50mg'$ Bid & HYZAAR 100-12.5 daily (off Lisinopril due to ACE cough)...  ~  8/12: BP=  128/84 & tol meds well; denies HA, fatigue, visual changes, CP, palipit, syncope, dyspnea, edema, etc... ~  2/13: BP= 140/64 & she remains essentially asymptomatic on Rx... ~  8/13:  BP= 132/64 &  she denies CP, palpit, SOB, edema, etc... ~  2/14: controlled on Toprol & Hyzaar w/ BP 144/72 today; denies visual changes, CP, palipit, dizziness, syncope, dyspnea, edema, etc... ~  5/14:  BP= 136/60 & she remains stable... ~  8/14:  BP= 134/60 and she denies CP, palpit, dizzy, SOB, edema... ~  12/14: on Metop50Bid, Hyzaar 100-12.5, Imdur30;  BP= 140/80 & she remains largely asymptomatic... ~  6/15: on ASA81, Metop50Bid, Hyzaar100-12.5, Imdur30; BP today= 136/68 & she denies CP, palpit, SOB, dizzy, edema, etc... ~  10/15: on same meds w/ BP= 160/78 & no changes meds- advised to monitor BP at home once she's over the URI  illness, take meds regualrly... ~  11/15: post Hosp for HBP, hyponatremia; they stopped diuretic & disch on Metop50Bid & Losar50; she went back to ER the next day w/ BP~200 (hadn't taken her Losartan) & given IV Hydralazine w/ improvement to 160s; we decided to continue the BBlocker, increase the ARB to '100mg'$ /d, and ADD HYDRALAZINE50- 1/2Bid for now; ROV 2 weeks... ~  11/15: she is s/p ER, Hosp, Cards visits & mult phone calls (see above)> meds ajusted- take Metop50Tid, Losartan100-1/2Tid, Apresoline50- 1/2 to 1tab Tid (she wants to do this since BP is low in AM & she feels weak, with BP elev in PM & she needs more)... ~  2/16: BP meds adjusted over the last few months & BP controlled on Metoprolol50Tid, Losar100-1/2Tid, Apres50-1/2Tid; BP today= 136/80, improved & she denies recent CP, palpit, dizzy, ch in SOB, edema... ~  3/16: pt presented w/ BP elev during recurrent bouts of HA pain- asked to increase her above meds to Metop50/ Losar100/ Apres50- at her Tid dosing intervals when needed for HBP while her HA meds are kicking in to resolve her pain; if her pain doesn't resolve & her BP does not improve, then she knows to go to the ER for immediate assessment...   CORONARY ARTERY DISEASE (ICD-414.00) - on ASA '81mg'$ /d (intol to 325 she says) & off Plavix per [name]...  Cardiolite 9/08 showed CP & HBP response, EF+85%... had cath 10/08 w/ single vessel dis w/ mod ostial LAD stenosis and patent LAD stent, normal LVF.[name]. option for med Rx vs off pump LIMA... she notes some CP/ pressure "burning" when walking up a certain hill during her daily walks, but states this is stable and "no worse"... ~  Myoview 9/09 was neg- no scar or ischemia, EF= 89%, hypertensive response- Lisinopril10 added. ~  f/u Myoview 3/11 was neg- no scar or ischemia, EF= 86%, norm BP response, fair exerc capacity. ~  saw [name] 5/11- no CP, he stopped her Plavix, stable- continue other meds same. ~  She had recurrent CP 11/11 w/ in-stent  restenosis on cath> subseq 3 vessel CABG by DrGearhart & doing well since then... ~  6/12:  Hosp w/ CP & repeat cath showed CAD & 3/3 grafts patent, EF= 65-70% ~  Myoview 5/13 was felt to be a normal stress nuclear study- normal wall motion, no ischemia, EF=84% ~  2/14: CP 11/11=> cath w/ in-stent restenosis, then CABG x3 by DrGearhardt; re-hospitalized 6/12 by Cards for CP> cath showed severe 3 vessel CAD w/ 3/3 grafts patent & norm LVF w/ EF= 65-70%; Myoview 5/13 showed no ischemia & EF>80%; no change in meds; & [name] noted that med rx is limited by side effects... ~  8/14 & 12/14: she remains stable on Imdur30 + Metop50Bid 7 Hyzaar100-12.5; denies CP, palpit, SOB, etc... ~  6/15: Cards f/u w/  DrNishan 1/15> HBP, CAD, HL; on ASA81, Metop50Bid, Hyzaar100-12.5, Imdur30; Hx neg Myoview 5/13 & doing satis- no changes made; BP today= 136/68 & she denies CP, palpit, SOB, dizzy, edema, etc ~  EKG 11/15 showed NSR, rate69, NSSTTWA, NAD  ~  2DEcho 11/15 showed  norm LVF w/ EF=60-65% & no regional wall motion abn, trivial AI, otherw wnl. ~  Myoview 11/15 showed low risk scan w/ abn EKG portion but no stress induced perfusion defects, hyperdynamic LVF w/ EF>70% & no regional wall motion abn. ~  11/15: she is off the Imdur per DrNishan due to HAs... ~  1/16: she had f/u DrNishan> stable on meds and no changes made...  HYPERLIPIDEMIA (ICD-272.4) - on CRESTOR '10mg'$ - 1/2 on M&F, FISH OIL '1200mg'$  Tid + Flax Seed Oil... she has been intol to statins in the past>  tried low dose Trilipix but stopped this due to "thigh burning">  now followed in the Lipid Clinic--- ~  [place] 7/08 showed TChol 217, TG 236, HDL 46, LDL 150...  ~  [place] 6/09 showed TChol 233, TG 263, HDL 46, LDL 150... she agrees to try LOW DOSE Trilipix. ~  FLP 11/09 showed TChol 162, TG 167, HDL 45, LDL 83... ~  [place] 11/10 showed TChol 240, TG 271, HDL 45, LDL 150... LipClin Rx w/ FishOil & FlaxSeedOil. ~  FLP 2/11 showed TChol 285, TG 156, HDL 79, LDL  180... LC is aware & working w/ her regularly. ~  [place] 5/11 showed TChol 277, TG 241, HDL55, LDL 193...  LC added Cres10 just 2d per week. ~  FLP 10/11 showed TChol 198, TG 215, HDL 53, LDL 109... much improved on Cres10 twice weekly. ~  She continues to f/u w/ Lipid clinic & they titrated up the Cres10 on M&F, plus '5mg'$  on Wednes> ~  FLP  10/12 was the best yet w/ TChol 167, TG 203, HDL 54, LDL 88...  ~  [place] 4/13 showed TChol 193, TG 176, HDL 57, LDL 101 ~  She is still followed in the [facility]- note 6/13 reviewed (at that time on Cres10 on M&F, '5mg'$  on W) but she subseq cut herself down to 1/2 on M&F due to nausea & aching. ~  FLP 2/14 on diet alone showed TChol 194, TG 143, HDL 55, LDL 110... We reviewed diet, exercise, etc... ~  She remains off the [place] on diet alone and she declines to restart low dose rx "I can't tolerate them"  GERD (ICD-530.81) - on OMEPRAZOLE '40mg'$ /d... last EGD by DrPatterson 8/07 revealed sm HH, & gastritis and dilatation done... (RUT=neg, PPI Rx)... LEVSIN 0.'125mg'$  helps her esoph spasm... Prn Phenergan for nausea.  DIVERTICULOSIS, COLON (ICD-562.10) & IRRITABLE BOWEL SYNDROME (ICD-564.1) ~  colonoscopy 2/03 by DrPatterson showed divertics and 5 mm polyp... ~  colonoscopy 3/11 showed divertics & cecal polyp= tubular adenoma, w/ f/u planned 45yr. ~  9/13: seen by DrPatterson f/u for ?diverticulitis- improved after Cipro/Flagyl but CTAbd was neg x diverticulosis; he rec high fiber diet & Citrucel...  ? GALLSTONES>>  Diagnosed by DrPatterson and referred to [facility] DrWilson, pt asked to call prn fopr incr symptoms to consider surg... ~  Abd Ultrasound 10/13 by DCherly Hensenfor eval CP showed> norm GB, liver, kidneys, & Ao- neg sonar.  BREAST CANCER >> Abn mammogram 10/12 showing a left breast nodule & subseq surg (partial mastectomy & sentinel node bx) by [name] 12/12 that proved to be an invasive ductal carcinoma, 2.3cm size, w/ neg lymphovasc invasion, clear margins (but  close at 30m),  & neg nodes; she had XRT by DrKindard (had her 11th treatment today); and Oncology eval by DrMagrinat who plans hormone Rx later (no chemo she says)...  ~  2/13: She is tolerating treatment well & spirits are up, [name] had to aspirate a seroma & it is now resolved... ~  She is followed by DrMagrinat, Streck, Kinard- [facility] /d w/ 546yrplanned... ~  6/14:  She had f/u DrMagrinat 6/14> his note is reviewed- s/p lumpectomy, sentinel node, XRT; plan is to continue Femara2.'5mg'$  thru 3/16 for 5y69yrx. ~  10/14:  She had routine f/u [name] 10/14> Hx left breast cancer dx 10/12 & treated w/ lumpectomy, XRT, & now Femara; exam neg, f/u mammogram neg, rec f/u 78yr12yr 6/15:  She saw Oncology for f/u of her left Breast Cancer> on Femara2.5 & tol well, s/p lumpectomy & sentinel node bx 12/12, followed by XRT finished 3/13, & on Letrozole since then, no known recurrence...   DJD, Neck Pain >> ~  XRays Cspine 4/14 in hosp showed multilevel cerv spondylosis, 1-2mm 75merolisthesis C4 on C5, DDD, foraminal stenosis at C5-6...  ~  5/14:  MRI Cspine => multilevel facet hypertrophy, foraminal narrowing & stenosis, disc osteophyte complexes, some central canal stenosis=> she was sent to NS, DrHirsh who rec Mobic & PT (helped alittle) but she was not pleased... ~  8/14:  She is requesting a Rheumatology 2nd opinion about her neck discomfort=> [facility], this exac HAs/migraines, dizzy/ light headed; MRI=> NS consult & not a surg cand & NSAIDs/ PT w/ min benefit; saw Neurology- tried Pamelor but feels hung-over; for her DDD & OA they rec Flex'5mg'$ Tid which has helped some...  FIBROMYALGIA (ICD-729.1) - she c/o chr fatigue, aching/ sore, on Tramadol & Tylenol, but most benefit from low dose Hydrocodone ~1/2 tab Prn... I have recommended an increase exercise program to her... ~  5/11:  Vit D level = 50 on 50000 u weekly by PattiHelene Shoe HEADACHE (ICD-784.0) [facility],  but she notes Rx didn't help... ~  3/11:  presented w/ muscle contraction HA's> Rx Tramadol '50mg'$  Q6H Prn. ~  She has persistent/ recurrent HAs... ~  11/15: her chronic daily HAs have again risen in improtance due to interaction w/ her HBP> asked to f/u w/ Neuro ASAP & Rx w/ rest, heating pad, Tramadol50/ Tylenol, and musc relaxer- Zanaflex2Tid vs Flexeril5Tid...  DIZZINESS, CHRONIC (ICD-780.4) - MRI 2/09 per DrNisCherly Hensened atrophy, sm vessel dis, NAD...  Health Maintenance: ~  GI:  DrPatterson & up to date on colon screening. ~  GYN:  yearly f/u w/ PattiHelene Shoefor PAP, [facility] ~  Immunizations:  yearly Flu vaccines in fall, Pneumovax in 2007?, TDAP given 5/11...   Past Surgical History  Procedure Laterality Date  . Coronary angioplasty with stent placement      Stent 2007  . Open heart surgery      01/19/2010  . Coronary artery bypass graft    . Cataract extraction, bilateral      bilateral caaract removal,  . Breast lumpectomy Left 02/06/11    Outpatient Encounter Prescriptions as of 06/20/2015  Medication Sig  . Acetaminophen (TYLENOL 8 HOUR PO) Take by mouth as directed.   . albMarland Kitchenterol (VENTOLIN HFA) 108 (90 Base) MCG/ACT inhaler Inhale 2 puffs into the lungs every 6 (six) hours as needed for wheezing or shortness of breath.  .[name]  amLODipine (NORVASC) 5 MG tablet Take 1 tablet (5 mg total) by mouth daily.  [name] aspirin EC 81 MG tablet Take 81 mg by mouth daily.  . clobetasol (TEMOVATE) 0.05 % external solution Apply 1 application topically 2 (two) times daily.  . fexofenadine (ALLEGRA) 180 MG tablet Take 180 mg by mouth daily.    . fluocinonide (LIDEX) 0.05 % external solution Apply 1 application topically daily as needed (scalp itching).   . hyoscyamine (ANASPAZ) 0.125 MG TBDP disintergrating tablet Place 0.125 mg under the tongue every 6 (six) hours as needed for bladder spasms or cramping.  [name] ketoconazole (NIZORAL) 2 % shampoo Apply 1 application  topically 2 (two) times a week. As needed for itching a couple of times week  . latanoprost (XALATAN) 0.005 % ophthalmic solution Place 1 drop into both eyes at bedtime.   [name] letrozole (FEMARA) 2.5 MG tablet Take 1 tablet (2.5 mg total) by mouth daily.  [name] losartan (COZAAR) 100 MG tablet take 1/2 tablet by mouth three times a day  . magnesium oxide (MAG-OX) 400 MG tablet Take 400 mg by mouth daily. Reported on 02/14/2015  . metoprolol (LOPRESSOR) 50 MG tablet Take 1 tablet (50 mg total) by mouth 3 (three) times daily.  . mometasone (NASONEX) 50 MCG/ACT nasal spray Place 2 sprays into the nose daily as needed (congestion).   . nitroGLYCERIN (NITROSTAT) 0.4 MG SL tablet Place 1 tablet (0.4 mg total) under the tongue every 5 (five) minutes as needed for chest pain.  . Omega-3 Fatty Acids (FISH OIL) 1200 MG CAPS Take 1,200 mg by mouth daily. Reported on 02/14/2015  . pantoprazole (PROTONIX) 40 MG tablet Take 1 tablet (40 mg total) by mouth daily.  . ranitidine (ZANTAC) 300 MG capsule Take 1 capsule (300 mg total) by mouth every evening.  . traMADol (ULTRAM) 50 MG tablet Take 50 mg by mouth as directed.  . vitamin B-12 (CYANOCOBALAMIN) 1000 MCG tablet Take 1,000 mcg by mouth daily.    . Glucosamine-Chondroit-Vit C-Mn (GLUCOSAMINE CHONDR 1500 COMPLX PO) Take 1 tablet by mouth 3 (three) times daily.    . hydrALAZINE (APRESOLINE) 50 MG tablet Take 0.5 tablets (25 mg total) by mouth 3 (three) times daily. (Patient not taking: Reported on 06/20/2015)  . [DISCONTINUED] omeprazole (PRILOSEC) 40 MG capsule take 1 capsule by mouth once daily 30 MINUTES BEFORE 1ST MEAL OF THE DAY (Patient not taking: Reported on 06/20/2015)   No facility-administered encounter medications on file as of 06/20/2015.    Allergies  Allergen Reactions  . Choline Fenofibrate Other (See Comments)     pt states INTOL to Trilipix w/ "thigh burning"  . Simvastatin Other (See Comments)     pt states INTOL to STATINS \\T \ refuses to restart   . Bentyl [Dicyclomine Hcl]     Made me feel weird and drained me  . Hctz [Hydrochlorothiazide]     Causes hyponatremia  . Hydrocodone     Nightmare after taking cough syrup w/hydrocodone  . Levaquin [Levofloxacin In D5w]     Elevated BP  . Adhesive [Tape] Rash  . Ceclor [Cefaclor] Rash  . Clarithromycin Rash  . Codeine Nausea Only  . Doxycycline Rash  . Lisinopril Cough    Developed ACE cough...  . Penicillins Itching and Rash    At injection site  . Tobramycin-Dexamethasone Rash    Current Medications, Allergies, Past Medical History, Past Surgical History, Family History, and Social History were reviewed in [employer] record.    Review  of Systems    See HPI - all other systems neg except as noted...  The patient complains of headaches.  The patient denies anorexia, fever, weight loss, weight gain, vision loss, decreased hearing, hoarseness, chest pain, syncope, dyspnea on exertion, peripheral edema, prolonged cough, hemoptysis, abdominal pain, melena, hematochezia, severe indigestion/heartburn, hematuria, incontinence, muscle weakness, suspicious skin lesions, transient blindness, difficulty walking, depression, unusual weight change, abnormal bleeding, enlarged lymph nodes, and angioedema.   Objective:   Physical Exam    WD, WN, 81 y/o WF in NAD... GENERAL:  Alert & oriented; pleasant & cooperative... HEENT:  [place]/AT, EOM-wnl, PERRLA, EACs-clear, TMs-wnl, NOSE-clear, THROAT- clear & wnl. NECK:  Decr ROM; no JVD; normal carotid impulses w/o bruits; no thyromegaly or nodules palpated; no lymphadenopathy. CHEST:  Clear- no wheezing, rales, or signs of consolidation... HEART:  Regular Rhythm; without murmurs/ rubs/ or gallops detected... ABDOMEN:  Soft & nontender; normal bowel sounds; no organomegaly or masses palpated... EXT: without deformities, mild arthritic changes and +trigger points, no varicose veins/ venous insuffic/ or edema. NEURO:  CN's  intact; motor testing normal; sensory testing normal; gait normal & balance OK. DERM:  No lesions noted; no rash etc...  RADIOLOGY DATA:  Reviewed in the EPIC EMR & discussed w/ the patient...  LABORATORY DATA:  Reviewed in the EPIC EMR & discussed w/ the patient...   Assessment & Plan:    Asthma/ AR>  Stable on allergy shots & XOPENEX as needed; no recent exac... Hx pill asp w/ asthma exac in past => resolved...  HBP>  Controlled on BP med regimen of Metoprolol50Tid, Losartan100-1/2Tid, Hydralazine50-1/2Tid... BP is noted to be up when she has pain, esp HA pain; Headaches are managed by Luvenia Redden on Tramadol, Flexeril, Zanaflex... 3/16> she presented w/ severe HA and assoc elev BP readings- we rec increasing her 3 meds to 1 of each Tid to manage the BP until the pain abates... 10/16> BP has been well controlled on her 3 med regimen...  CAD>  Cath w/ patent grafts; had neg Nuclear study; continue same meds and f/u DrNishan...  CHOL>  Followed in the Lipid Clinic & intol to all meds- FLP looks better however on diet alone...  GI> GERD, Divertics, ?Gallstones>  Followed by [facility] & stones eval by DrWilson [facility]; odd that f/u sonar 9/13 did not show stones...  BREAST CANCER>  on FEMARA > treated by [name], DrMagrinat, DrKinard & records reviewed...  FM/ HAs/ etc>  On Ultram, Tylenol, etc => needs active management of this prob by Neuro w/ an "action plan" for severe HA episodes... NECK PAIN> with multilevel spondylosis & eval by NS, DrHirsh but she was not pleased & is asking for Rheum eval...  Other medical issues as noted> on CoQ10, Glucosamine, Vit B12, Vit D, etc...   Patient's Medications  New Prescriptions   No medications on file  Previous Medications   ACETAMINOPHEN (TYLENOL 8 HOUR PO)    Take by mouth as directed.    ALBUTEROL (VENTOLIN HFA) 108 (90 BASE) MCG/ACT INHALER    Inhale 2 puffs into the lungs every 6 (six) hours as needed for wheezing or shortness of  breath.   AMLODIPINE (NORVASC) 5 MG TABLET    Take 1 tablet (5 mg total) by mouth daily.   ASPIRIN EC 81 MG TABLET    Take 81 mg by mouth daily.   CLOBETASOL (TEMOVATE) 0.05 % EXTERNAL SOLUTION    Apply 1 application topically 2 (two) times daily.   FEXOFENADINE (ALLEGRA) 180 MG  TABLET    Take 180 mg by mouth daily.     FLUOCINONIDE (LIDEX) 0.05 % EXTERNAL SOLUTION    Apply 1 application topically daily as needed (scalp itching).    GLUCOSAMINE-CHONDROIT-VIT C-MN (GLUCOSAMINE CHONDR 1500 COMPLX PO)    Take 1 tablet by mouth 3 (three) times daily.     HYDRALAZINE (APRESOLINE) 50 MG TABLET    Take 0.5 tablets (25 mg total) by mouth 3 (three) times daily.   HYOSCYAMINE (ANASPAZ) 0.125 MG TBDP DISINTERGRATING TABLET    Place 0.125 mg under the tongue every 6 (six) hours as needed for bladder spasms or cramping.   KETOCONAZOLE (NIZORAL) 2 % SHAMPOO    Apply 1 application topically 2 (two) times a week. As needed for itching a couple of times week   LATANOPROST (XALATAN) 0.005 % OPHTHALMIC SOLUTION    Place 1 drop into both eyes at bedtime.    LETROZOLE (FEMARA) 2.5 MG TABLET    Take 1 tablet (2.5 mg total) by mouth daily.   LOSARTAN (COZAAR) 100 MG TABLET    take 1/2 tablet by mouth three times a day   MAGNESIUM OXIDE (MAG-OX) 400 MG TABLET    Take 400 mg by mouth daily. Reported on 02/14/2015   METOPROLOL (LOPRESSOR) 50 MG TABLET    Take 1 tablet (50 mg total) by mouth 3 (three) times daily.   MOMETASONE (NASONEX) 50 MCG/ACT NASAL SPRAY    Place 2 sprays into the nose daily as needed (congestion).    NITROGLYCERIN (NITROSTAT) 0.4 MG SL TABLET    Place 1 tablet (0.4 mg total) under the tongue every 5 (five) minutes as needed for chest pain.   OMEGA-3 FATTY ACIDS (FISH OIL) 1200 MG CAPS    Take 1,200 mg by mouth daily. Reported on 02/14/2015   PANTOPRAZOLE (PROTONIX) 40 MG TABLET    Take 1 tablet (40 mg total) by mouth daily.   RANITIDINE (ZANTAC) 300 MG CAPSULE    Take 1 capsule (300 mg total) by mouth  every evening.   TRAMADOL (ULTRAM) 50 MG TABLET    Take 50 mg by mouth as directed.   VITAMIN B-12 (CYANOCOBALAMIN) 1000 MCG TABLET    Take 1,000 mcg by mouth daily.    Modified Medications   No medications on file  Discontinued Medications   OMEPRAZOLE (PRILOSEC) 40 MG CAPSULE    take 1 capsule by mouth once daily 30 MINUTES BEFORE 1ST MEAL OF THE DAY

## 2015-06-20 NOTE — Patient Instructions (Signed)
Today we updated your med list in our EPIC system...    Continue your current medications the same...  Today we checked a fiollow up CXR spirometry breathing test & blood work...    We will contact you w/ the results when available...   Continue your current meds the same for now...  Remember to stay as active as possible (good exercise program)...  Call for any questions...  Let's plan a follow up visit in 34mo, sooner if needed for problems.Marland KitchenMarland Kitchen

## 2015-06-22 NOTE — Progress Notes (Signed)
Quick Note:  ATC pt x 3. Line busy. WCB. ______

## 2015-06-23 NOTE — Progress Notes (Signed)
Quick Note:  ATC pt x 3. Line busy. WCB. ______

## 2015-06-26 DIAGNOSIS — J3089 Other allergic rhinitis: Secondary | ICD-10-CM | POA: Diagnosis not present

## 2015-06-26 DIAGNOSIS — J301 Allergic rhinitis due to pollen: Secondary | ICD-10-CM | POA: Diagnosis not present

## 2015-06-28 NOTE — Progress Notes (Signed)
Quick Note:  ATC pt x 3. Line busy.  Called and spoke to pt's daughter, [name]. Informed her that we have tried to contact pt but have been unsuccessful. Pt's daughter states she will inform pt to call us back. ______

## 2015-06-28 NOTE — Progress Notes (Signed)
Quick Note:  ATC pt x 3. Line busy.  Called and spoke to pt's daughter, [name]. Informed her that we have tried to contact pt but have been unsuccessful. Pt's daughter states she will inform pt to call us back. ______

## 2015-06-29 ENCOUNTER — Telehealth: Payer: Self-pay | Admitting: Pulmonary Disease

## 2015-06-29 NOTE — Telephone Encounter (Signed)
Notes Recorded by Noralee Space, MD on 06/21/2015 at 3:09 PM Please notify patient>  CXR shows borderline heart size, s/p CABG surgery, clear lungs, NAD.Marland KitchenMarland Kitchen ----------------- Notes Recorded by Noralee Space, MD on 06/21/2015 at 3:10 PM Please notify patient>  LABS show CBC, Chems, Thyroid- all essentially wnl... Continue same meds...  ----------  Spoke with pt, aware of results/recs.  Nothing further needed.

## 2015-07-11 ENCOUNTER — Other Ambulatory Visit: Payer: Self-pay | Admitting: *Deleted

## 2015-07-11 MED ORDER — LOSARTAN POTASSIUM 100 MG PO TABS: ORAL_TABLET | ORAL | Status: DC

## 2015-07-12 ENCOUNTER — Encounter (HOSPITAL_BASED_OUTPATIENT_CLINIC_OR_DEPARTMENT_OTHER): Payer: Self-pay | Admitting: Emergency Medicine

## 2015-07-12 ENCOUNTER — Emergency Department (HOSPITAL_BASED_OUTPATIENT_CLINIC_OR_DEPARTMENT_OTHER)
Admission: EM | Admit: 2015-07-12 | Discharge: 2015-07-12 | Disposition: A | Discharge status: Home / Self Care | Payer: Medicare Other | Attending: Emergency Medicine | Admitting: Emergency Medicine

## 2015-07-12 DIAGNOSIS — J45909 Unspecified asthma, uncomplicated: Secondary | ICD-10-CM | POA: Insufficient documentation

## 2015-07-12 DIAGNOSIS — Y929 Unspecified place or not applicable: Secondary | ICD-10-CM | POA: Insufficient documentation

## 2015-07-12 DIAGNOSIS — Z79899 Other long term (current) drug therapy: Secondary | ICD-10-CM | POA: Insufficient documentation

## 2015-07-12 DIAGNOSIS — Z853 Personal history of malignant neoplasm of breast: Secondary | ICD-10-CM | POA: Diagnosis not present

## 2015-07-12 DIAGNOSIS — Z7982 Long term (current) use of aspirin: Secondary | ICD-10-CM | POA: Diagnosis not present

## 2015-07-12 DIAGNOSIS — M9903 Segmental and somatic dysfunction of lumbar region: Secondary | ICD-10-CM | POA: Diagnosis not present

## 2015-07-12 DIAGNOSIS — W57XXXA Bitten or stung by nonvenomous insect and other nonvenomous arthropods, initial encounter: Secondary | ICD-10-CM | POA: Insufficient documentation

## 2015-07-12 DIAGNOSIS — I10 Essential (primary) hypertension: Secondary | ICD-10-CM | POA: Insufficient documentation

## 2015-07-12 DIAGNOSIS — I2510 Atherosclerotic heart disease of native coronary artery without angina pectoris: Secondary | ICD-10-CM | POA: Diagnosis not present

## 2015-07-12 DIAGNOSIS — M9901 Segmental and somatic dysfunction of cervical region: Secondary | ICD-10-CM | POA: Diagnosis not present

## 2015-07-12 DIAGNOSIS — M4692 Unspecified inflammatory spondylopathy, cervical region: Secondary | ICD-10-CM | POA: Insufficient documentation

## 2015-07-12 DIAGNOSIS — Y999 Unspecified external cause status: Secondary | ICD-10-CM | POA: Diagnosis not present

## 2015-07-12 DIAGNOSIS — M545 Low back pain: Secondary | ICD-10-CM | POA: Diagnosis not present

## 2015-07-12 DIAGNOSIS — S90561A Insect bite (nonvenomous), right ankle, initial encounter: Secondary | ICD-10-CM | POA: Insufficient documentation

## 2015-07-12 DIAGNOSIS — M542 Cervicalgia: Secondary | ICD-10-CM | POA: Diagnosis not present

## 2015-07-12 DIAGNOSIS — Y939 Activity, unspecified: Secondary | ICD-10-CM | POA: Insufficient documentation

## 2015-07-12 MED ORDER — SULFAMETHOXAZOLE-TRIMETHOPRIM 800-160 MG PO TABS: 1.0000 | ORAL_TABLET | Freq: Two times a day (BID) | ORAL | Status: AC

## 2015-07-12 MED FILL — SULFAMETHOXAZOLE-TMP DS TAB: 800-160 | 7 days supply | Qty: 14 | Fill #0

## 2015-07-12 NOTE — ED Notes (Signed)
Pt in c/o inflamed insect bite to R ankle onset 1 week ago and worsening over time. States she is concerned it's becoming infected. Ambulatory in NAD.

## 2015-07-12 NOTE — ED Provider Notes (Signed)
CSN: [id number]     Arrival date & time 07/12/15  13 History   First MD Initiated Contact with Patient 07/12/15 1554     Chief Complaint  Patient presents with  . Insect Bite     (Consider location/radiation/quality/duration/timing/severity/associated sxs/prior Treatment) HPI Comments: Patient is a 80 year old female with past medical history of fibromyalgia, coronary artery disease with CABG, reflux. She presents today for evaluation of an insect bite to the inside of her right ankle. This started 1 week ago and is now becoming more swollen and red. It is more painful. She denies any fevers or chills. She denies any headaches.  The history is provided by the patient.    Past Medical History  Diagnosis Date  . Diverticulosis of colon (without mention of hemorrhage)   . Irritable bowel syndrome   . Colonic polyp 04-27-2009    tubular adenoma  . Headache(784.0)     a. frequently assocaited with high BPs  . Breast cancer, Left 12/20/2010    NO BLOOD PRESSURE CHECKS OR STICKS IN LEFT ARM  . Gallstones   . CAD (coronary artery disease)     a. 01/19/2010 s/p CABG x 3, lima->lad, vg->diag, vg->om1;  b. 06/2011 :Lexi MV: EF 84%, No ischemia. c. 01/06/14 s/p negative nuclear stress test with EF >70%  . Fibromyalgia   . Atrophic vaginitis   . GERD (gastroesophageal reflux disease)   . Hypercholesterolemia   . Glaucoma   . Cervical arthritis ([place])   . Hiatal hernia   . Labile hypertension   . Asthma    Past Surgical History  Procedure Laterality Date  . Coronary angioplasty with stent placement      Stent 2007  . Open heart surgery      01/19/2010  . Coronary artery bypass graft    . Cataract extraction, bilateral      bilateral caaract removal,  . Breast lumpectomy Left 02/06/11   Family History  Problem Relation Age of Onset  . Heart disease Brother   . Cancer Brother     gland cancer  . Diabetes Sister   . Breast cancer Sister   . Stroke Mother   . Stroke Father   .  Colon cancer Neg Hx   . Stomach cancer Neg Hx   . Pancreatic cancer Neg Hx   . Prostate cancer Father   . Kidney disease Neg Hx   . Liver disease Neg Hx    Social History  Substance Use Topics  . Smoking status: Never Smoker   . Smokeless tobacco: Never Used  . Alcohol Use: No   OB History    Gravida Para Term Preterm AB TAB SAB Ectopic Multiple Living   3 2 2  0 1 0 0 0 0 2     Review of Systems  All other systems reviewed and are negative.     Allergies  Choline fenofibrate; Simvastatin; Bentyl; Hctz; Hydrocodone; Levaquin; Adhesive; Ceclor; Clarithromycin; Codeine; Doxycycline; Lisinopril; Penicillins; and Tobramycin-dexamethasone  Home Medications   Prior to Admission medications   Medication Sig Start Date End Date Taking? Authorizing Provider  Acetaminophen (TYLENOL 8 HOUR PO) Take by mouth as directed.     Historical Provider, MD  albuterol (VENTOLIN HFA) 108 (90 Base) MCG/ACT inhaler Inhale 2 puffs into the lungs every 6 (six) hours as needed for wheezing or shortness of breath.    Historical Provider, MD  amLODipine (NORVASC) 5 MG tablet Take 1 tablet (5 mg total) by mouth daily. 01/23/15   Remo Lipps  Peterson Lombard, MD  aspirin EC 81 MG tablet Take 81 mg by mouth daily.    Historical Provider, MD  clobetasol (TEMOVATE) 0.05 % external solution Apply 1 application topically 2 (two) times daily.    Historical Provider, MD  fexofenadine (ALLEGRA) 180 MG tablet Take 180 mg by mouth daily.      Historical Provider, MD  fluocinonide (LIDEX) 0.05 % external solution Apply 1 application topically daily as needed (scalp itching).  04/29/13   Historical Provider, MD  Glucosamine-Chondroit-Vit C-Mn (GLUCOSAMINE CHONDR 1500 COMPLX PO) Take 1 tablet by mouth 3 (three) times daily.      Historical Provider, MD  hydrALAZINE (APRESOLINE) 50 MG tablet Take 0.5 tablets (25 mg total) by mouth 3 (three) times daily. Patient not taking: Reported on 06/20/2015 01/31/15   Josue Hector, MD  hyoscyamine  (ANASPAZ) 0.125 MG TBDP disintergrating tablet Place 0.125 mg under the tongue every 6 (six) hours as needed for bladder spasms or cramping.    Historical Provider, MD  ketoconazole (NIZORAL) 2 % shampoo Apply 1 application topically 2 (two) times a week. As needed for itching a couple of times week 04/29/13   Historical Provider, MD  latanoprost (XALATAN) 0.005 % ophthalmic solution Place 1 drop into both eyes at bedtime.  07/31/12   Historical Provider, MD  letrozole (FEMARA) 2.5 MG tablet Take 1 tablet (2.5 mg total) by mouth daily. 09/06/14   Chauncey Cruel, MD  losartan (COZAAR) 100 MG tablet take 1/2 tablet by mouth three times a day 07/11/15   Noralee Space, MD  magnesium oxide (MAG-OX) 400 MG tablet Take 400 mg by mouth daily. Reported on 02/14/2015    Historical Provider, MD  metoprolol (LOPRESSOR) 50 MG tablet Take 1 tablet (50 mg total) by mouth 3 (three) times daily. 10/21/14   Josue Hector, MD  mometasone (NASONEX) 50 MCG/ACT nasal spray Place 2 sprays into the nose daily as needed (congestion).     Historical Provider, MD  nitroGLYCERIN (NITROSTAT) 0.4 MG SL tablet Place 1 tablet (0.4 mg total) under the tongue every 5 (five) minutes as needed for chest pain. 03/24/14   Josue Hector, MD  Omega-3 Fatty Acids (FISH OIL) 1200 MG CAPS Take 1,200 mg by mouth daily. Reported on 02/14/2015    Historical Provider, MD  pantoprazole (PROTONIX) 40 MG tablet Take 1 tablet (40 mg total) by mouth daily. 05/16/15   Jessica D Zehr, PA-C  ranitidine (ZANTAC) 300 MG capsule Take 1 capsule (300 mg total) by mouth every evening. 06/23/14   Lori P Hvozdovic, PA-C  traMADol (ULTRAM) 50 MG tablet Take 50 mg by mouth as directed.    Historical Provider, MD  vitamin B-12 (CYANOCOBALAMIN) 1000 MCG tablet Take 1,000 mcg by mouth daily.      Historical Provider, MD   BP 165/71 mmHg  Pulse 65  Temp(Src) 98.4 F (36.9 C) (Oral)  Resp 18  Ht 4\' 10"  (1.473 m)  Wt 138 lb (62.596 kg)  BMI 28.85 kg/m2  SpO2 98%  LMP  11/26/1990 Physical Exam  Constitutional: She is oriented to person, place, and time. She appears well-developed and well-nourished. No distress.  HENT:  Head: Normocephalic and atraumatic.  Neck: Normal range of motion. Neck supple.  Musculoskeletal: Normal range of motion.  Neurological: She is alert and oriented to person, place, and time.  Skin: Skin is warm and dry. She is not diaphoretic.  There is a 2 cm round, pustule to the inside of the right ankle.  There is mild surrounding erythema.  Nursing note and vitals reviewed.   ED Course  Procedures (including critical care time) Labs Review Labs Reviewed - No data to display  Imaging Review No results found. I have personally reviewed and evaluated these images and lab results as part of my medical decision-making.   EKG Interpretation None      MDM   Final diagnoses:  None    This will be treated with Keflex, local wound care and when necessary return.    Veryl Speak, MD 07/12/15 [phone]

## 2015-07-12 NOTE — Discharge Instructions (Signed)
Bactrim as prescribed.  Local wound care with bacitracin and dressing changes twice daily.  Return to the ER symptoms significantly worsen or change.   Cellulitis Cellulitis is an infection of the skin and the tissue beneath it. The infected area is usually red and tender. Cellulitis occurs most often in the arms and lower legs.  CAUSES  Cellulitis is caused by bacteria that enter the skin through cracks or cuts in the skin. The most common types of bacteria that cause cellulitis are staphylococci and streptococci. SIGNS AND SYMPTOMS   Redness and warmth.  Swelling.  Tenderness or pain.  Fever. DIAGNOSIS  Your health care provider can usually determine what is wrong based on a physical exam. Blood tests may also be done. TREATMENT  Treatment usually involves taking an antibiotic medicine. HOME CARE INSTRUCTIONS   Take your antibiotic medicine as directed by your health care provider. Finish the antibiotic even if you start to feel better.  Keep the infected arm or leg elevated to reduce swelling.  Apply a warm cloth to the affected area up to 4 times per day to relieve pain.  Take medicines only as directed by your health care provider.  Keep all follow-up visits as directed by your health care provider. SEEK MEDICAL CARE IF:   You notice red streaks coming from the infected area.  Your red area gets larger or turns dark in color.  Your bone or joint underneath the infected area becomes painful after the skin has healed.  Your infection returns in the same area or another area.  You notice a swollen bump in the infected area.  You develop new symptoms.  You have a fever. SEEK IMMEDIATE MEDICAL CARE IF:   You feel very sleepy.  You develop vomiting or diarrhea.  You have a general ill feeling (malaise) with muscle aches and pains.   This information is not intended to replace advice given to you by your health care provider. Make sure you discuss any questions  you have with your health care provider.   Document Released: 11/21/2004 Document Revised: 11/02/2014 Document Reviewed: 04/29/2011 Elsevier Interactive Patient Education [employer].

## 2015-07-13 DIAGNOSIS — J3089 Other allergic rhinitis: Secondary | ICD-10-CM | POA: Diagnosis not present

## 2015-07-13 DIAGNOSIS — J301 Allergic rhinitis due to pollen: Secondary | ICD-10-CM | POA: Diagnosis not present

## 2015-07-14 ENCOUNTER — Other Ambulatory Visit: Payer: Self-pay | Admitting: Pulmonary Disease

## 2015-07-14 MED ORDER — LOSARTAN POTASSIUM 100 MG PO TABS: ORAL_TABLET | ORAL | Status: DC

## 2015-07-14 NOTE — Telephone Encounter (Signed)
Received faxed refill request for Losartan 100mg  1/2 tab TID #45 with 5 refills Okayed per SN's signature and faxed back to [place] list updated and rx sent for scan Will sign off

## 2015-07-21 ENCOUNTER — Other Ambulatory Visit: Payer: Self-pay | Admitting: Cardiovascular Disease

## 2015-07-25 DIAGNOSIS — M797 Fibromyalgia: Secondary | ICD-10-CM | POA: Diagnosis not present

## 2015-07-25 DIAGNOSIS — M542 Cervicalgia: Secondary | ICD-10-CM | POA: Diagnosis not present

## 2015-07-25 DIAGNOSIS — M9901 Segmental and somatic dysfunction of cervical region: Secondary | ICD-10-CM | POA: Diagnosis not present

## 2015-07-25 DIAGNOSIS — M9903 Segmental and somatic dysfunction of lumbar region: Secondary | ICD-10-CM | POA: Diagnosis not present

## 2015-07-28 ENCOUNTER — Encounter: Payer: Self-pay | Admitting: Neurology

## 2015-07-28 ENCOUNTER — Other Ambulatory Visit: Payer: Self-pay | Admitting: Cardiovascular Disease

## 2015-08-02 DIAGNOSIS — J3089 Other allergic rhinitis: Secondary | ICD-10-CM | POA: Diagnosis not present

## 2015-08-02 DIAGNOSIS — J301 Allergic rhinitis due to pollen: Secondary | ICD-10-CM | POA: Diagnosis not present

## 2015-08-08 DIAGNOSIS — M9903 Segmental and somatic dysfunction of lumbar region: Secondary | ICD-10-CM | POA: Diagnosis not present

## 2015-08-08 DIAGNOSIS — M542 Cervicalgia: Secondary | ICD-10-CM | POA: Diagnosis not present

## 2015-08-08 DIAGNOSIS — M9901 Segmental and somatic dysfunction of cervical region: Secondary | ICD-10-CM | POA: Diagnosis not present

## 2015-08-08 DIAGNOSIS — M545 Low back pain: Secondary | ICD-10-CM | POA: Diagnosis not present

## 2015-08-09 DIAGNOSIS — J301 Allergic rhinitis due to pollen: Secondary | ICD-10-CM | POA: Diagnosis not present

## 2015-08-09 DIAGNOSIS — J3089 Other allergic rhinitis: Secondary | ICD-10-CM | POA: Diagnosis not present

## 2015-08-22 DIAGNOSIS — M545 Low back pain: Secondary | ICD-10-CM | POA: Diagnosis not present

## 2015-08-22 DIAGNOSIS — M542 Cervicalgia: Secondary | ICD-10-CM | POA: Diagnosis not present

## 2015-08-22 DIAGNOSIS — M9903 Segmental and somatic dysfunction of lumbar region: Secondary | ICD-10-CM | POA: Diagnosis not present

## 2015-08-22 DIAGNOSIS — M9901 Segmental and somatic dysfunction of cervical region: Secondary | ICD-10-CM | POA: Diagnosis not present

## 2015-08-24 ENCOUNTER — Other Ambulatory Visit: Payer: Self-pay | Admitting: Gastroenterology

## 2015-09-07 DIAGNOSIS — M9901 Segmental and somatic dysfunction of cervical region: Secondary | ICD-10-CM | POA: Diagnosis not present

## 2015-09-07 DIAGNOSIS — M9903 Segmental and somatic dysfunction of lumbar region: Secondary | ICD-10-CM | POA: Diagnosis not present

## 2015-09-07 DIAGNOSIS — M542 Cervicalgia: Secondary | ICD-10-CM | POA: Diagnosis not present

## 2015-09-07 DIAGNOSIS — J3089 Other allergic rhinitis: Secondary | ICD-10-CM | POA: Diagnosis not present

## 2015-09-07 DIAGNOSIS — M545 Low back pain: Secondary | ICD-10-CM | POA: Diagnosis not present

## 2015-09-07 DIAGNOSIS — J301 Allergic rhinitis due to pollen: Secondary | ICD-10-CM | POA: Diagnosis not present

## 2015-09-18 ENCOUNTER — Telehealth: Payer: Self-pay | Admitting: Gastroenterology

## 2015-09-18 MED ORDER — METRONIDAZOLE 500 MG PO TABS: 500.0000 mg | ORAL_TABLET | Freq: Two times a day (BID) | ORAL | 0 refills | Status: DC

## 2015-09-18 MED ORDER — CIPROFLOXACIN HCL 500 MG PO TABS: 500.0000 mg | ORAL_TABLET | Freq: Two times a day (BID) | ORAL | 0 refills | Status: DC

## 2015-09-18 NOTE — Telephone Encounter (Signed)
Patient notified of the recommendations She will call the end of the week if her symptoms don't improve

## 2015-09-18 NOTE — Telephone Encounter (Signed)
Patient reports that she is having what she feels is a flare of diverticulitis.  She reports pain started on Saturday, LLQ.  Pain is worse with ambulation.  She reports that she was constipated on Friday and Saturday, now is having diarrhea 3 episodes this am.  She denies fever.  She has placed herself on a bland diet.  No APP appts until Thursday.  Dr. Fuller Plan please advise.

## 2015-09-18 NOTE — Telephone Encounter (Signed)
Cipro 500 mg po bid, #14 Flagyl 500 mg po bid, #14 She did have Levsin q4h prn and IBgard bid prn at last office and she can use both Clear liquids for 1 day and then low residue diet Call if symptoms not improving in a few days

## 2015-09-19 ENCOUNTER — Ambulatory Visit: Payer: Medicare Other | Admitting: Nurse Practitioner

## 2015-09-21 DIAGNOSIS — M9903 Segmental and somatic dysfunction of lumbar region: Secondary | ICD-10-CM | POA: Diagnosis not present

## 2015-09-21 DIAGNOSIS — M545 Low back pain: Secondary | ICD-10-CM | POA: Diagnosis not present

## 2015-09-21 DIAGNOSIS — M9901 Segmental and somatic dysfunction of cervical region: Secondary | ICD-10-CM | POA: Diagnosis not present

## 2015-09-21 DIAGNOSIS — M542 Cervicalgia: Secondary | ICD-10-CM | POA: Diagnosis not present

## 2015-09-26 DIAGNOSIS — J301 Allergic rhinitis due to pollen: Secondary | ICD-10-CM | POA: Diagnosis not present

## 2015-09-26 DIAGNOSIS — J3089 Other allergic rhinitis: Secondary | ICD-10-CM | POA: Diagnosis not present

## 2015-10-02 ENCOUNTER — Other Ambulatory Visit: Payer: Self-pay | Admitting: Pulmonary Disease

## 2015-10-02 NOTE — Telephone Encounter (Signed)
Pt requesting a refill on Tramadol. Last refill: 10/19/14 #90 with 5 refills- 1 tab tid prn. Last office visit: 06/20/15 Next office visit: 12/20/15  SN ok to refill?  Thanks!

## 2015-10-03 ENCOUNTER — Ambulatory Visit: Payer: Medicare Other | Admitting: Nurse Practitioner

## 2015-10-05 ENCOUNTER — Other Ambulatory Visit: Payer: Self-pay | Admitting: Internal Medicine

## 2015-10-10 DIAGNOSIS — M9901 Segmental and somatic dysfunction of cervical region: Secondary | ICD-10-CM | POA: Diagnosis not present

## 2015-10-10 DIAGNOSIS — M542 Cervicalgia: Secondary | ICD-10-CM | POA: Diagnosis not present

## 2015-10-10 DIAGNOSIS — M9903 Segmental and somatic dysfunction of lumbar region: Secondary | ICD-10-CM | POA: Diagnosis not present

## 2015-10-10 DIAGNOSIS — M797 Fibromyalgia: Secondary | ICD-10-CM | POA: Diagnosis not present

## 2015-10-10 NOTE — Progress Notes (Signed)
Patient ID: [name], female   DOB: 1936/01/13, 80 y.o.   MRN: [id number]   [name] is a 80 y.o. [name] female with a history of CAD, CABG 2011, HTN, HLD, HA, fibromyalgia, IBS, and GERD  She was admitted 10/29-11/02 with HA due to hypertensive emergency (SBP > 220), and hyponatremia (Na + 119). HCTZ was stopped and her sodium improved. She may have been taking an OTC medication with pseudoephedrine.   She was seen in the ER on 11/04 with HTN, SBP > 210. Discharged from the ER.  Seen by Dr. Lenna Gilford 11/05, BP meds increased with addition of hydralazine 25 mg BID, increased Losartan 50 mg-->100 mg daily.  ED visit 11/09 with HA, BP 212/92, MD suggested she stagger meds for better 24 hour coverage, no med changes made.   She was seen in hospital 11/15  With r/o normal lexiscan myovue and echo with normal EF  BP meds adjusted  Not on diuretic due to hyponatremia  Started on amlodipine November 2016  She seems more anxious with labile emotions  Sees Lenna Gilford as primary and Rosina Lowenstein neuro for headaches.  Has had them ? A year  When she gets them Makes her BP labile   She is intolerant to statins and refuses to try new ones   Had injection neck for headaches and is seeing PT  ROS: Denies fever, malais, weight loss, blurry vision, decreased visual acuity, cough, sputum, SOB, hemoptysis, pleuritic pain, palpitaitons, heartburn, abdominal pain, melena, lower extremity edema, claudication, or rash.  All other systems reviewed and negative  General: Affect appropriate Healthy:  appears stated age 18: normal Neck supple with no adenopathy JVP normal no bruits no thyromegaly Lungs clear with no wheezing and good diaphragmatic motion Heart:  S1/S2 no murmur, no rub, gallop or click PMI normal Abdomen: benighn, BS positve, no tenderness, no AAA no bruit.  No HSM or HJR Distal pulses intact with no bruits No edema Neuro non-focal Skin warm and dry No muscular  weakness   Current Outpatient Prescriptions  Medication Sig Dispense Refill  . Acetaminophen (TYLENOL 8 HOUR PO) Take by mouth as directed.     [name] albuterol (VENTOLIN HFA) 108 (90 Base) MCG/ACT inhaler Inhale 2 puffs into the lungs every 6 (six) hours as needed for wheezing or shortness of breath.    [name] amLODipine (NORVASC) 5 MG tablet take 1 tablet by mouth once daily 30 tablet 4  . aspirin EC 81 MG tablet Take 81 mg by mouth daily.    . fexofenadine (ALLEGRA) 180 MG tablet Take 180 mg by mouth daily.      . fluocinonide (LIDEX) 0.05 % external solution Apply 1 application topically daily as needed (scalp itching).     . Glucosamine-Chondroit-Vit C-Mn (GLUCOSAMINE CHONDR 1500 COMPLX PO) Take 1 tablet by mouth 3 (three) times daily.      . hydrALAZINE (APRESOLINE) 50 MG tablet take 1/2 tablet by mouth three times a day 45 tablet 2  . hyoscyamine (ANASPAZ) 0.125 MG TBDP disintergrating tablet Place 0.125 mg under the tongue every 6 (six) hours as needed for bladder spasms or cramping.    [name] ketoconazole (NIZORAL) 2 % shampoo Apply 1 application topically 2 (two) times a week. As needed for itching a couple of times week    . latanoprost (XALATAN) 0.005 % ophthalmic solution Place 1 drop into both eyes at bedtime.     [name] letrozole (FEMARA) 2.5 MG tablet Take 1 tablet (2.5 mg total) by  mouth daily. 90 tablet 3  . losartan (COZAAR) 100 MG tablet take 1/2 tablet by mouth three times a day 45 tablet 5  . magnesium oxide (MAG-OX) 400 MG tablet Take 400 mg by mouth daily. Reported on 02/14/2015    . metoprolol (LOPRESSOR) 50 MG tablet take 1 tablet by mouth three times a day 270 tablet 1  . metroNIDAZOLE (FLAGYL) 500 MG tablet Take 1 tablet (500 mg total) by mouth 2 (two) times daily. 14 tablet 0  . mometasone (NASONEX) 50 MCG/ACT nasal spray Place 2 sprays into the nose daily as needed (congestion).     . nitroGLYCERIN (NITROSTAT) 0.4 MG SL tablet Place 1 tablet (0.4 mg total) under the tongue every 5  (five) minutes as needed for chest pain. 25 tablet 3  . Omega-3 Fatty Acids (FISH OIL) 1200 MG CAPS Take 1,200 mg by mouth daily. Reported on 02/14/2015    . pantoprazole (PROTONIX) 40 MG tablet take 1 tablet by mouth once daily 30 tablet 1  . ranitidine (ZANTAC) 300 MG capsule Take 1 capsule (300 mg total) by mouth every evening. 30 capsule 3  . traMADol (ULTRAM) 50 MG tablet take 1 tablet by mouth three times a day if needed 90 tablet 5  . vitamin B-12 (CYANOCOBALAMIN) 1000 MCG tablet Take 1,000 mcg by mouth daily.       No current facility-administered medications for this visit.     Allergies  Choline fenofibrate; Simvastatin; Bentyl [dicyclomine hcl]; Hctz [hydrochlorothiazide]; Hydrocodone; Levaquin [levofloxacin in d5w]; Adhesive [tape]; Ceclor [cefaclor]; Clarithromycin; Codeine; Doxycycline; Lisinopril; Penicillins; and Tobramycin-dexamethasone  Electrocardiogram:  SR rate 64  Normal LAE 01/14/14  10/11/15 SR rate 59 LVH no change   Assessment and Plan CAD/CABG: non ishcemic myovue 2015  She will call if has to take nitro continue ASA/Beta blocker  HTN:  Improved on amlodipine   Breast Cancer   F/U Magrinat No BP's on left arm  mamogram last year ok  Chol:   Lab Results  Component Value Date   LDLCALC 110 (H) 04/06/2012   Labs with Dr Ree Kida to statin    Headaches:  Seen by neurology cervicogenic ? Nerve block injection x1 continue PT gabapentin has been d/c   F/U me 6 months  Jenkins Rouge

## 2015-10-11 ENCOUNTER — Encounter: Payer: Self-pay | Admitting: Cardiovascular Disease

## 2015-10-11 ENCOUNTER — Ambulatory Visit (INDEPENDENT_AMBULATORY_CARE_PROVIDER_SITE_OTHER): Payer: Medicare Other | Admitting: Cardiovascular Disease

## 2015-10-11 VITALS — BP 132/60 | HR 61 | Ht <= 58 in | Wt 136.8 lb

## 2015-10-11 DIAGNOSIS — I2510 Atherosclerotic heart disease of native coronary artery without angina pectoris: Secondary | ICD-10-CM

## 2015-10-11 DIAGNOSIS — I2583 Coronary atherosclerosis due to lipid rich plaque: Principal | ICD-10-CM

## 2015-10-11 MED ORDER — NITROGLYCERIN 0.4 MG SL SUBL: 0.4000 mg | SUBLINGUAL_TABLET | SUBLINGUAL | 3 refills | Status: DC | PRN

## 2015-10-11 NOTE — Patient Instructions (Signed)

## 2015-10-16 ENCOUNTER — Other Ambulatory Visit: Payer: Self-pay | Admitting: Oncology

## 2015-10-16 DIAGNOSIS — J301 Allergic rhinitis due to pollen: Secondary | ICD-10-CM | POA: Diagnosis not present

## 2015-10-16 DIAGNOSIS — J3089 Other allergic rhinitis: Secondary | ICD-10-CM | POA: Diagnosis not present

## 2015-10-16 DIAGNOSIS — Z853 Personal history of malignant neoplasm of breast: Secondary | ICD-10-CM

## 2015-10-16 MED FILL — LETROZOLE 2.5 MG TABLET: 2.5 | 90 days supply | Qty: 90 | Fill #0

## 2015-10-31 DIAGNOSIS — M545 Low back pain: Secondary | ICD-10-CM | POA: Diagnosis not present

## 2015-10-31 DIAGNOSIS — M9903 Segmental and somatic dysfunction of lumbar region: Secondary | ICD-10-CM | POA: Diagnosis not present

## 2015-10-31 DIAGNOSIS — M9901 Segmental and somatic dysfunction of cervical region: Secondary | ICD-10-CM | POA: Diagnosis not present

## 2015-10-31 DIAGNOSIS — M542 Cervicalgia: Secondary | ICD-10-CM | POA: Diagnosis not present

## 2015-11-01 ENCOUNTER — Ambulatory Visit (INDEPENDENT_AMBULATORY_CARE_PROVIDER_SITE_OTHER): Payer: Medicare Other | Admitting: Nurse Practitioner

## 2015-11-01 ENCOUNTER — Encounter: Payer: Self-pay | Admitting: Nurse Practitioner

## 2015-11-01 VITALS — BP 142/74 | HR 60 | Ht <= 58 in | Wt 139.0 lb

## 2015-11-01 DIAGNOSIS — Z0000 Encounter for general adult medical examination without abnormal findings: Secondary | ICD-10-CM

## 2015-11-01 DIAGNOSIS — Z01419 Encounter for gynecological examination (general) (routine) without abnormal findings: Secondary | ICD-10-CM | POA: Diagnosis not present

## 2015-11-01 DIAGNOSIS — E559 Vitamin D deficiency, unspecified: Secondary | ICD-10-CM | POA: Diagnosis not present

## 2015-11-01 DIAGNOSIS — N368 Other specified disorders of urethra: Secondary | ICD-10-CM

## 2015-11-01 LAB — POCT URINALYSIS DIPSTICK
BILIRUBIN UA: NEGATIVE
Blood, UA: NEGATIVE
Glucose, UA: NEGATIVE
KETONES UA: NEGATIVE
LEUKOCYTES UA: NEGATIVE
Nitrite, UA: NEGATIVE
PH UA: 6
Protein, UA: NEGATIVE
Urobilinogen, UA: NEGATIVE

## 2015-11-01 NOTE — Progress Notes (Signed)
Patient ID: [name], female   DOB: 1935-07-06, 80 y.o.   MRN: [id number]   80 y.o. [id number] Married  Caucasian Fe here for annual exam.  New problems with cervical neck pain and has had injection.  She also has been in PT.  For awhile symptoms were some better but now back again.  Considering if she needs PT again.  She has no other new health problems.  Patient's last menstrual period was 11/26/1990.          Sexually active: No.  The current method of family planning is abstinence.    Exercising: Yes.    walking Smoker:  no  Health Maintenance: Pap: 09/06/14, negative MMG:  03/30/14, 3D, Bi-Rads 2:  Benign findings Colonoscopy:  06/29/14, 1 Tubular adenoma, 1 hyperplastic polyp, no repeat due to age BMD: 12/02/14 T Score, -1.80 Spine / -1.40 Right Femur Neck / -1.60 Left Femur Neck TDaP: 07/25/09 Shingles: 03/08/13 Pneumonia: 02/25/05, 04/13/14 Prevnar 13 Hep C and HIV: Not indicated due to age Labs: Dr. Lenna Gilford   reports that she has never smoked. She has never used smokeless tobacco. She reports that she does not drink alcohol or use drugs.  Past Medical History:  Diagnosis Date  . Asthma   . Atrophic vaginitis   . Breast cancer, Left 12/20/2010   NO BLOOD PRESSURE CHECKS OR STICKS IN LEFT ARM  . CAD (coronary artery disease)    a. 01/19/2010 s/p CABG x 3, lima->lad, vg->diag, vg->om1;  b. 06/2011 :Lexi MV: EF 84%, No ischemia. c. 01/06/14 s/p negative nuclear stress test with EF >70%  . Cervical arthritis ([place])   . Colonic polyp 04-27-2009   tubular adenoma  . Diverticulosis of colon (without mention of hemorrhage)   . Fibromyalgia   . Gallstones   . GERD (gastroesophageal reflux disease)   . Glaucoma   . Headache(784.0)    a. frequently assocaited with high BPs  . Hiatal hernia   . Hypercholesterolemia   . Irritable bowel syndrome   . Labile hypertension     Past Surgical History:  Procedure Laterality Date  . BREAST LUMPECTOMY Left 02/06/11  . CATARACT EXTRACTION,  BILATERAL     bilateral caaract removal,  . CORONARY ANGIOPLASTY WITH STENT PLACEMENT     Stent 2007  . CORONARY ARTERY BYPASS GRAFT    . open heart surgery     01/19/2010    Current Outpatient Prescriptions  Medication Sig Dispense Refill  . Acetaminophen (TYLENOL 8 HOUR PO) Take by mouth as directed.     [name] albuterol (VENTOLIN HFA) 108 (90 Base) MCG/ACT inhaler Inhale 2 puffs into the lungs every 6 (six) hours as needed for wheezing or shortness of breath.    [name] amLODipine (NORVASC) 5 MG tablet take 1 tablet by mouth once daily 30 tablet 4  . aspirin EC 81 MG tablet Take 81 mg by mouth daily.    . fexofenadine (ALLEGRA) 180 MG tablet Take 180 mg by mouth daily.      . fluocinonide (LIDEX) 0.05 % external solution Apply 1 application topically daily as needed (scalp itching).     . Glucosamine-Chondroit-Vit C-Mn (GLUCOSAMINE CHONDR 1500 COMPLX PO) Take 1 tablet by mouth 3 (three) times daily.      . hydrALAZINE (APRESOLINE) 50 MG tablet take 1/2 tablet by mouth three times a day 45 tablet 2  . hyoscyamine (ANASPAZ) 0.125 MG TBDP disintergrating tablet Place 0.125 mg under the tongue every 6 (six) hours as needed for bladder spasms  or cramping.    [name] ketoconazole (NIZORAL) 2 % shampoo Apply 1 application topically 2 (two) times a week. As needed for itching a couple of times week    . latanoprost (XALATAN) 0.005 % ophthalmic solution Place 1 drop into both eyes at bedtime.     [name] letrozole (FEMARA) 2.5 MG tablet Take 1 tablet (2.5 mg total) by mouth daily. 90 tablet 3  . losartan (COZAAR) 100 MG tablet take 1/2 tablet by mouth three times a day 45 tablet 5  . magnesium oxide (MAG-OX) 400 MG tablet Take 400 mg by mouth daily. Reported on 02/14/2015    . metoprolol (LOPRESSOR) 50 MG tablet take 1 tablet by mouth three times a day 270 tablet 1  . mometasone (NASONEX) 50 MCG/ACT nasal spray Place 2 sprays into the nose daily as needed (congestion).     . nitroGLYCERIN (NITROSTAT) 0.4 MG SL tablet  Place 1 tablet (0.4 mg total) under the tongue every 5 (five) minutes as needed for chest pain. 25 tablet 3  . Omega-3 Fatty Acids (FISH OIL) 1200 MG CAPS Take 1,200 mg by mouth daily. Reported on 02/14/2015    . pantoprazole (PROTONIX) 40 MG tablet take 1 tablet by mouth once daily 30 tablet 1  . ranitidine (ZANTAC) 300 MG capsule Take 1 capsule (300 mg total) by mouth every evening. 30 capsule 3  . traMADol (ULTRAM) 50 MG tablet take 1 tablet by mouth three times a day if needed 90 tablet 5  . vitamin B-12 (CYANOCOBALAMIN) 1000 MCG tablet Take 1,000 mcg by mouth daily.       No current facility-administered medications for this visit.     Family History  Problem Relation Age of Onset  . Stroke Mother   . Stroke Father   . Prostate cancer Father   . Breast cancer Sister   . Diabetes Sister   . Cancer Brother     gland cancer  . Heart disease Brother   . Colon cancer Neg Hx   . Stomach cancer Neg Hx   . Pancreatic cancer Neg Hx   . Kidney disease Neg Hx   . Liver disease Neg Hx     ROS:  Pertinent items are noted in HPI.  Otherwise, a comprehensive ROS was negative.  Exam:   BP (!) 142/74 (BP Location: Right Arm, Patient Position: Sitting, Cuff Size: Normal)   Pulse 60   Ht 4\' 10"  (1.473 m)   Wt 139 lb (63 kg)   LMP 11/26/1990   BMI 29.05 kg/m  Height: 4\' 10"  (147.3 cm) Ht Readings from Last 3 Encounters:  11/01/15 4\' 10"  (1.473 m)  10/11/15 4\' 10"  (1.473 m)  07/12/15 4\' 10"  (1.473 m)    General appearance: alert, cooperative and appears stated age Head: Normocephalic, without obvious abnormality, atraumatic Neck: no adenopathy, supple, symmetrical, trachea midline and thyroid normal to inspection and palpation Lungs: clear to auscultation bilaterally Breasts: normal appearance, no masses or tenderness, on the right.  There are surgical and radiation changes on the left.  No new mass. Heart: regular rate and rhythm Abdomen: soft, non-tender; no masses,  no  organomegaly Extremities: extremities normal, atraumatic, no cyanosis or edema Skin: Skin color, texture, turgor normal. No rashes or lesions Lymph nodes: Cervical, supraclavicular, and axillary nodes normal. No abnormal inguinal nodes palpated Neurologic: Grossly normal   Pelvic: External genitalia:  no lesions              Urethra:  normal appearing urethra with  no masses, tenderness or lesions              Bartholin's and Skene's: normal                 Vagina: normal appearing vagina with a cluster of varicose veins below the urethra.  Otherwise normal color and discharge, no lesions.  patient was also seen with Dr. Quincy Simmonds              Cervix: anteverted              Pap taken: No. Bimanual Exam:  Uterus:  normal size, contour, position, consistency, mobility, non-tender              Adnexa: no mass, fullness, tenderness               Rectovaginal: Confirms               Anus:  normal sphincter tone, no lesions  Chaperone present: yes  A:  Well Woman with normal exam            S/P left invasive breast cancer 2012 treated with lumpectomy and radiation, on Femara History of HTN, hypercholesterolemia, CAD with CABG 12/2009 Osteopenia              Recent  History of cervical neck pain and HA's    P:   Reviewed health and wellness pertinent to exam  Pap smear as above  Mammogram is due now and will schedule - thinks she got off schedule dealing with HA's  She is advised to get OTC organic coconut oil to use prn at the vulva  Counseled on breast self exam, mammography screening, adequate intake of calcium and vitamin D, diet and exercise, Kegel's exercises return annually or prn  An After Visit Summary was printed and given to the patient.

## 2015-11-01 NOTE — Patient Instructions (Addendum)

## 2015-11-02 DIAGNOSIS — J3089 Other allergic rhinitis: Secondary | ICD-10-CM | POA: Diagnosis not present

## 2015-11-02 DIAGNOSIS — J301 Allergic rhinitis due to pollen: Secondary | ICD-10-CM | POA: Diagnosis not present

## 2015-11-04 NOTE — Progress Notes (Signed)
Encounter reviewed by Dr. Brook Amundson C. Silva.  

## 2015-11-07 ENCOUNTER — Telehealth: Payer: Self-pay | Admitting: Neurology

## 2015-11-07 DIAGNOSIS — R51 Headache: Principal | ICD-10-CM

## 2015-11-07 DIAGNOSIS — G4486 Cervicogenic headache: Secondary | ICD-10-CM

## 2015-11-07 NOTE — Telephone Encounter (Signed)
Contacted [facility] and verified referral was needed. Referral sent for continuing PT and dry needling. LVM for pt that referral was sent and to contact us with and questions or concerns.

## 2015-11-07 NOTE — Telephone Encounter (Signed)
[name] 2035/07/29. She called and was going to set up the therapy and they told her they needed the Referral sent to them for therapy on her neck and dry needling and other therapy. Her # is [phone]. Thank you

## 2015-11-28 DIAGNOSIS — J301 Allergic rhinitis due to pollen: Secondary | ICD-10-CM | POA: Diagnosis not present

## 2015-11-28 DIAGNOSIS — M545 Low back pain: Secondary | ICD-10-CM | POA: Diagnosis not present

## 2015-11-28 DIAGNOSIS — M542 Cervicalgia: Secondary | ICD-10-CM | POA: Diagnosis not present

## 2015-11-28 DIAGNOSIS — M9903 Segmental and somatic dysfunction of lumbar region: Secondary | ICD-10-CM | POA: Diagnosis not present

## 2015-11-28 DIAGNOSIS — J3089 Other allergic rhinitis: Secondary | ICD-10-CM | POA: Diagnosis not present

## 2015-11-28 DIAGNOSIS — M9901 Segmental and somatic dysfunction of cervical region: Secondary | ICD-10-CM | POA: Diagnosis not present

## 2015-12-08 ENCOUNTER — Encounter (HOSPITAL_BASED_OUTPATIENT_CLINIC_OR_DEPARTMENT_OTHER): Payer: Self-pay | Admitting: Emergency Medicine

## 2015-12-08 ENCOUNTER — Emergency Department (HOSPITAL_BASED_OUTPATIENT_CLINIC_OR_DEPARTMENT_OTHER)
Admission: EM | Admit: 2015-12-08 | Discharge: 2015-12-08 | Disposition: A | Discharge status: Home / Self Care | Payer: Medicare Other | Attending: Emergency Medicine | Admitting: Emergency Medicine

## 2015-12-08 ENCOUNTER — Emergency Department (HOSPITAL_BASED_OUTPATIENT_CLINIC_OR_DEPARTMENT_OTHER): Payer: Medicare Other

## 2015-12-08 DIAGNOSIS — I2510 Atherosclerotic heart disease of native coronary artery without angina pectoris: Secondary | ICD-10-CM | POA: Diagnosis not present

## 2015-12-08 DIAGNOSIS — Z7982 Long term (current) use of aspirin: Secondary | ICD-10-CM | POA: Insufficient documentation

## 2015-12-08 DIAGNOSIS — K2970 Gastritis, unspecified, without bleeding: Secondary | ICD-10-CM | POA: Diagnosis not present

## 2015-12-08 DIAGNOSIS — R1084 Generalized abdominal pain: Secondary | ICD-10-CM

## 2015-12-08 DIAGNOSIS — Z79899 Other long term (current) drug therapy: Secondary | ICD-10-CM | POA: Diagnosis not present

## 2015-12-08 DIAGNOSIS — Z951 Presence of aortocoronary bypass graft: Secondary | ICD-10-CM | POA: Diagnosis not present

## 2015-12-08 DIAGNOSIS — J45909 Unspecified asthma, uncomplicated: Secondary | ICD-10-CM | POA: Insufficient documentation

## 2015-12-08 DIAGNOSIS — Z853 Personal history of malignant neoplasm of breast: Secondary | ICD-10-CM | POA: Diagnosis not present

## 2015-12-08 DIAGNOSIS — R1013 Epigastric pain: Secondary | ICD-10-CM | POA: Diagnosis present

## 2015-12-08 DIAGNOSIS — R1011 Right upper quadrant pain: Secondary | ICD-10-CM

## 2015-12-08 LAB — COMPREHENSIVE METABOLIC PANEL
ALK PHOS: 80 U/L (ref 38–126)
ALT: 22 U/L (ref 14–54)
ANION GAP: 9 (ref 5–15)
AST: 25 U/L (ref 15–41)
Albumin: 4.5 g/dL (ref 3.5–5.0)
BILIRUBIN TOTAL: 1.2 mg/dL (ref 0.3–1.2)
BUN: 15 mg/dL (ref 6–20)
CALCIUM: 9.6 mg/dL (ref 8.9–10.3)
CO2: 28 mmol/L (ref 22–32)
Chloride: 96 mmol/L — ABNORMAL LOW (ref 101–111)
Creatinine, Ser: 0.63 mg/dL (ref 0.44–1.00)
GLUCOSE: 112 mg/dL — AB (ref 65–99)
POTASSIUM: 4 mmol/L (ref 3.5–5.1)
Sodium: 133 mmol/L — ABNORMAL LOW (ref 135–145)
TOTAL PROTEIN: 7.8 g/dL (ref 6.5–8.1)

## 2015-12-08 LAB — CBC
HEMATOCRIT: 39.4 % (ref 36.0–46.0)
HEMOGLOBIN: 13.8 g/dL (ref 12.0–15.0)
MCH: 30.9 pg (ref 26.0–34.0)
MCHC: 35 g/dL (ref 30.0–36.0)
MCV: 88.3 fL (ref 78.0–100.0)
Platelets: 290 10*3/uL (ref 150–400)
RBC: 4.46 MIL/uL (ref 3.87–5.11)
RDW: 14.2 % (ref 11.5–15.5)
WBC: 7.5 10*3/uL (ref 4.0–10.5)

## 2015-12-08 LAB — URINALYSIS, ROUTINE W REFLEX MICROSCOPIC
BILIRUBIN URINE: NEGATIVE
Glucose, UA: NEGATIVE mg/dL
Hgb urine dipstick: NEGATIVE
KETONES UR: NEGATIVE mg/dL
LEUKOCYTES UA: NEGATIVE
NITRITE: NEGATIVE
PH: 8 (ref 5.0–8.0)
Protein, ur: NEGATIVE mg/dL
SPECIFIC GRAVITY, URINE: 1.008 (ref 1.005–1.030)

## 2015-12-08 LAB — LIPASE, BLOOD: Lipase: 16 U/L (ref 11–51)

## 2015-12-08 MED ORDER — ONDANSETRON HCL 4 MG/2ML IJ SOLN
4.0000 mg | Freq: Once | INTRAMUSCULAR | Status: AC
  Administered 2015-12-08: 4 mg via INTRAVENOUS
  Filled 2015-12-08: qty 2

## 2015-12-08 MED ORDER — FAMOTIDINE IN NACL 20-0.9 MG/50ML-% IV SOLN
20.0000 mg | Freq: Once | INTRAVENOUS | Status: AC
  Administered 2015-12-08: 20 mg via INTRAVENOUS
  Filled 2015-12-08: qty 50

## 2015-12-08 MED ORDER — SUCRALFATE 1 G PO TABS: 1.0000 g | ORAL_TABLET | Freq: Four times a day (QID) | ORAL | 0 refills | Status: DC

## 2015-12-08 MED ORDER — MORPHINE SULFATE (PF) 4 MG/ML IV SOLN
4.0000 mg | INTRAVENOUS | Status: DC | PRN
  Administered 2015-12-08: 4 mg via INTRAVENOUS
  Filled 2015-12-08: qty 1

## 2015-12-08 MED ORDER — MORPHINE SULFATE (PF) 4 MG/ML IV SOLN
4.0000 mg | Freq: Once | INTRAVENOUS | Status: AC
  Administered 2015-12-08: 4 mg via INTRAVENOUS
  Filled 2015-12-08: qty 1

## 2015-12-08 MED ORDER — OXYCODONE-ACETAMINOPHEN 5-325 MG PO TABS: 2.0000 | ORAL_TABLET | ORAL | 0 refills | Status: DC | PRN

## 2015-12-08 MED FILL — OXYCODONE/APAP 5/325 MG TAB: 5-325 | 1 days supply | Qty: 6 | Fill #0

## 2015-12-08 MED FILL — SUCRALFATE 1 GM TABLET: 1 | 15 days supply | Qty: 60 | Fill #0

## 2015-12-08 NOTE — Discharge Instructions (Signed)
No ibuprofen or Aleve. Only over-the-counter Tylenol. No caffeine. Continue your omeprazole, and Protonix. Start prescription for Carafate. Follow up with your GI doctor.

## 2015-12-08 NOTE — ED Provider Notes (Addendum)
[facility] Provider Note   CSN: [id number] Arrival date & time: 12/08/15  0737     History   Chief Complaint Chief Complaint  Patient presents with  . Abdominal Pain    HPI [name] is a 80 y.o. female.  She presents for evaluation of abdominal pain.  She awakened approximately 4 AM with epigastric pain. Described as a burning. Does radiate to her bilateral upper quadrants and into her back. Mild nausea but no vomiting. No chest pain shortness of breath. She has had some neck and shoulder pain and has been taking Aleve and Motrin once each daily for the last week. Has a history of gastritis. Does take omeprazole in the mornings, and Protonix at night although infrequently. Takes caffeine one dose daily. No alcohol use. Does not smoke. Has history of known gallstones. Has not undergone cholecystectomy. Normally eats a regular diet without limitations or food intolerances.  In review her chart she's had similar past episodes. Showed normal CT scan 18 months ago without aneurysm or other identifiable abnormalities  HPI  Past Medical History:  Diagnosis Date  . Asthma   . Atrophic vaginitis   . Breast cancer, Left 12/20/2010   NO BLOOD PRESSURE CHECKS OR STICKS IN LEFT ARM  . CAD (coronary artery disease)    a. 01/19/2010 s/p CABG x 3, lima->lad, vg->diag, vg->om1;  b. 06/2011 :Lexi MV: EF 84%, No ischemia. c. 01/06/14 s/p negative nuclear stress test with EF >70%  . Cervical arthritis ([place])   . Colonic polyp 04-27-2009   tubular adenoma  . Diverticulosis of colon (without mention of hemorrhage)   . Fibromyalgia   . Gallstones   . GERD (gastroesophageal reflux disease)   . Glaucoma   . Headache(784.0)    a. frequently assocaited with high BPs  . Hiatal hernia   . Hypercholesterolemia   . Irritable bowel syndrome   . Labile hypertension     Patient Active Problem List   Diagnosis Date Noted  . Bloating 05/23/2015  . Diarrhea 05/23/2015  . Heartburn  05/23/2015  . Bilateral occipital neuralgia 11/23/2014  . Cervicogenic headache 11/23/2014  . Neck pain 11/23/2014  . OSA on CPAP 11/23/2014  . Generalized anxiety disorder 02/04/2014  . Labile hypertension   . Fibromyalgia   . Cervical spondylolysis 12/24/2013  . CAD (coronary artery disease) 12/24/2013  . Hypertensive emergency 12/23/2013  . Acute encephalopathy 12/23/2013  . Bronchitis, chronic obstructive w acute bronchitis ([place]) 12/21/2013  . Diverticulosis of colon without hemorrhage 11/15/2013  . Hot flashes related to aromatase inhibitor therapy 07/27/2013  . Osteopenia 07/27/2013  . DJD (degenerative joint disease) 09/29/2012  . Breast cancer, Left 12/20/2010  . Leg pain 11/28/2010  . FATTY LIVER DISEASE 01/09/2010  . GASTRITIS 12/16/2007  . Allergic rhinitis 02/18/2007  . DIZZINESS, CHRONIC 02/18/2007  . Headache in back of head 02/18/2007  . Mixed hyperlipidemia 12/23/2006  . GLAUCOMA 12/23/2006  . Coronary atherosclerosis 12/23/2006  . Asthma 12/23/2006  . GERD 12/23/2006  . IRRITABLE BOWEL SYNDROME 12/23/2006  . Myalgia and myositis 12/23/2006    Past Surgical History:  Procedure Laterality Date  . BREAST LUMPECTOMY Left 02/06/11  . CATARACT EXTRACTION, BILATERAL     bilateral caaract removal,  . CORONARY ANGIOPLASTY WITH STENT PLACEMENT     Stent 2007  . CORONARY ARTERY BYPASS GRAFT    . open heart surgery     01/19/2010    OB History    Gravida Para Term Preterm AB Living  3 2 2  0 1 2   SAB TAB Ectopic Multiple Live Births   0 0 0 0 2       Home Medications    Prior to Admission medications   Medication Sig Start Date End Date Taking? Authorizing Provider  Acetaminophen (TYLENOL 8 HOUR PO) Take by mouth as directed.    Yes Historical Provider, MD  albuterol (VENTOLIN HFA) 108 (90 Base) MCG/ACT inhaler Inhale 2 puffs into the lungs every 6 (six) hours as needed for wheezing or shortness of breath.   Yes Historical Provider, MD  amLODipine  (NORVASC) 5 MG tablet take 1 tablet by mouth once daily 10/05/15  Yes Josue Hector, MD  aspirin EC 81 MG tablet Take 81 mg by mouth daily.   Yes Historical Provider, MD  letrozole (FEMARA) 2.5 MG tablet Take 1 tablet (2.5 mg total) by mouth daily. 09/06/14  Yes Chauncey Cruel, MD  losartan (COZAAR) 100 MG tablet take 1/2 tablet by mouth three times a day 07/14/15  Yes Noralee Space, MD  metoprolol (LOPRESSOR) 50 MG tablet take 1 tablet by mouth three times a day 07/28/15  Yes Josue Hector, MD  nitroGLYCERIN (NITROSTAT) 0.4 MG SL tablet Place 1 tablet (0.4 mg total) under the tongue every 5 (five) minutes as needed for chest pain. 10/11/15  Yes Josue Hector, MD  pantoprazole (PROTONIX) 40 MG tablet take 1 tablet by mouth once daily 08/24/15  Yes Ladene Artist, MD  ranitidine (ZANTAC) 300 MG capsule Take 1 capsule (300 mg total) by mouth every evening. 06/23/14  Yes Lori P Hvozdovic, PA-C  vitamin B-12 (CYANOCOBALAMIN) 1000 MCG tablet Take 1,000 mcg by mouth daily.     Yes Historical Provider, MD  fexofenadine (ALLEGRA) 180 MG tablet Take 180 mg by mouth daily.      Historical Provider, MD  fluocinonide (LIDEX) 0.05 % external solution Apply 1 application topically daily as needed (scalp itching).  04/29/13   Historical Provider, MD  Glucosamine-Chondroit-Vit C-Mn (GLUCOSAMINE CHONDR 1500 COMPLX PO) Take 1 tablet by mouth 3 (three) times daily.      Historical Provider, MD  hydrALAZINE (APRESOLINE) 50 MG tablet take 1/2 tablet by mouth three times a day 07/21/15   Josue Hector, MD  hyoscyamine (ANASPAZ) 0.125 MG TBDP disintergrating tablet Place 0.125 mg under the tongue every 6 (six) hours as needed for bladder spasms or cramping.    Historical Provider, MD  ketoconazole (NIZORAL) 2 % shampoo Apply 1 application topically 2 (two) times a week. As needed for itching a couple of times week 04/29/13   Historical Provider, MD  latanoprost (XALATAN) 0.005 % ophthalmic solution Place 1 drop into both eyes at  bedtime.  07/31/12   Historical Provider, MD  magnesium oxide (MAG-OX) 400 MG tablet Take 400 mg by mouth daily. Reported on 02/14/2015    Historical Provider, MD  mometasone (NASONEX) 50 MCG/ACT nasal spray Place 2 sprays into the nose daily as needed (congestion).     Historical Provider, MD  Omega-3 Fatty Acids (FISH OIL) 1200 MG CAPS Take 1,200 mg by mouth daily. Reported on 02/14/2015    Historical Provider, MD  oxyCODONE-acetaminophen (PERCOCET/ROXICET) 5-325 MG tablet Take 2 tablets by mouth every 4 (four) hours as needed. 12/08/15   Tanna Furry, MD  sucralfate (CARAFATE) 1 g tablet Take 1 tablet (1 g total) by mouth 4 (four) times daily. 12/08/15   Tanna Furry, MD    Family History Family History  Problem Relation  Age of Onset  . Stroke Mother   . Stroke Father   . Prostate cancer Father   . Breast cancer Sister   . Diabetes Sister   . Cancer Brother     gland cancer  . Heart disease Brother   . Colon cancer Neg Hx   . Stomach cancer Neg Hx   . Pancreatic cancer Neg Hx   . Kidney disease Neg Hx   . Liver disease Neg Hx     Social History Social History  Substance Use Topics  . Smoking status: Never Smoker  . Smokeless tobacco: Never Used  . Alcohol use No     Allergies   Choline fenofibrate; Simvastatin; Bentyl [dicyclomine hcl]; Hctz [hydrochlorothiazide]; Hydrocodone; Levaquin [levofloxacin in d5w]; Adhesive [tape]; Ceclor [cefaclor]; Clarithromycin; Codeine; Doxycycline; Lisinopril; Penicillins; and Tobramycin-dexamethasone   Review of Systems Review of Systems  Constitutional: Negative for appetite change, chills, diaphoresis, fatigue and fever.  HENT: Negative for mouth sores, sore throat and trouble swallowing.   Eyes: Negative for visual disturbance.  Respiratory: Negative for cough, chest tightness, shortness of breath and wheezing.   Cardiovascular: Negative for chest pain.  Gastrointestinal: Positive for abdominal distention and nausea. Negative for  abdominal pain, diarrhea and vomiting.  Endocrine: Negative for polydipsia, polyphagia and polyuria.  Genitourinary: Negative for dysuria, frequency and hematuria.  Musculoskeletal: Negative for gait problem.  Skin: Negative for color change, pallor and rash.  Neurological: Negative for dizziness, syncope, light-headedness and headaches.  Hematological: Does not bruise/bleed easily.  Psychiatric/Behavioral: Negative for behavioral problems and confusion.     Physical Exam Updated Vital Signs BP 109/60   Pulse (!) 59   Temp 98.3 F (36.8 C) (Oral)   Resp 13   Ht 4\' 10"  (1.473 m)   Wt 136 lb (61.7 kg)   LMP 11/26/1990   SpO2 91%   BMI 28.42 kg/m   Physical Exam  Constitutional: She is oriented to person, place, and time. She appears well-developed and well-nourished. No distress.  HENT:  Head: Normocephalic.  Eyes: Conjunctivae are normal. Pupils are equal, round, and reactive to light. No scleral icterus.  Neck: Normal range of motion. Neck supple. No thyromegaly present.  Cardiovascular: Normal rate and regular rhythm.  Exam reveals no gallop and no friction rub.   No murmur heard. Pulmonary/Chest: Effort normal and breath sounds normal. No respiratory distress. She has no wheezes. She has no rales.  Abdominal: Soft. Bowel sounds are normal. She exhibits no distension. There is no tenderness. There is no rebound.  Complains of the pain primarily in her epigastrium. No guarding rebound or peritoneal irritation.  Pain is not reproducible.  Musculoskeletal: Normal range of motion.  Neurological: She is alert and oriented to person, place, and time.  Skin: Skin is warm and dry. No rash noted.  Psychiatric: She has a normal mood and affect. Her behavior is normal.     ED Treatments / Results  Labs (all labs ordered are listed, but only abnormal results are displayed) Labs Reviewed  COMPREHENSIVE METABOLIC PANEL - Abnormal; Notable for the following:       Result Value    Sodium 133 (*)    Chloride 96 (*)    Glucose, Bld 112 (*)    All other components within normal limits  LIPASE, BLOOD  CBC  URINALYSIS, ROUTINE W REFLEX MICROSCOPIC (NOT AT [facility])    EKG  EKG Interpretation  Date/Time:  Friday December 08 2015 08:12:04 EDT Ventricular Rate:  62 PR Interval:  QRS Duration: 78 QT Interval:  417 QTC Calculation: 424 R Axis:   14 Text Interpretation:  Sinus rhythm Consider left atrial enlargement Abnormal R-wave progression, early transition Minimal ST depression, anterolateral leads Confirmed by Jeneen Rinks  MD, [place] ([id number]) on 12/08/2015 8:30:21 AM       Radiology US Abdomen Limited Ruq  Result Date: 12/08/2015 CLINICAL DATA:  Right upper quadrant pain since 12 a.m. EXAM: US ABDOMEN LIMITED - RIGHT UPPER QUADRANT COMPARISON:  None. FINDINGS: Gallbladder: No gallstones or wall thickening visualized. No sonographic Murphy sign noted by sonographer. Common bile duct: Diameter: 2.9 mm Liver: No focal lesion identified. Within normal limits in parenchymal echogenicity. IMPRESSION: Normal right upper quadrant ultrasound. Electronically Signed   By: Kathreen Devoid   On: 12/08/2015 09:30    Procedures Procedures (including critical care time)  Medications Ordered in ED Medications  morphine 4 MG/ML injection 4 mg (4 mg Intravenous Given 12/08/15 0845)  ondansetron (ZOFRAN) injection 4 mg (4 mg Intravenous Given 12/08/15 0845)  famotidine (PEPCID) IVPB 20 mg premix (0 mg Intravenous Stopped 12/08/15 0921)  morphine 4 MG/ML injection 4 mg (4 mg Intravenous Given 12/08/15 1108)     Initial Impression / Assessment and Plan / ED Course  I have reviewed the triage vital signs and the nursing notes.  Pertinent labs & imaging results that were available during my care of the patient were reviewed by me and considered in my medical decision making (see chart for details).  Clinical Course    Reassuring labs. Normal hepatobiliary and pink reticulocyte enzymes.  Ultrasound without stones, or signs of cholecystitis. His symptoms are consistent with gastritis. She will stop caffeine. She will stop anti-inflammatories. Continue proton pump inhibitor of choice twice a day. Carafate. GI follow-up. ER with acute changes. I did review CT scan from 18 months ago has no aneurysm at that time and again no other abnormalities noted. Think she is appropriate for outpatient follow-up.  Final Clinical Impressions(s) / ED Diagnoses   Final diagnoses:  RUQ pain  Generalized abdominal pain  Gastritis without bleeding, unspecified chronicity, unspecified gastritis type    New Prescriptions New Prescriptions   OXYCODONE-ACETAMINOPHEN (PERCOCET/ROXICET) 5-325 MG TABLET    Take 2 tablets by mouth every 4 (four) hours as needed.   SUCRALFATE (CARAFATE) 1 G TABLET    Take 1 tablet (1 g total) by mouth 4 (four) times daily.     Tanna Furry, MD 12/08/15 [place], MD 12/08/15 1226

## 2015-12-08 NOTE — ED Triage Notes (Signed)
Pt reports she was awakened during the night around midnight with upper abd pain radiating through to her back. Pt denies N/V/D, chest pain, SOB, urinary symptoms.

## 2015-12-12 DIAGNOSIS — H40013 Open angle with borderline findings, low risk, bilateral: Secondary | ICD-10-CM | POA: Diagnosis not present

## 2015-12-19 ENCOUNTER — Other Ambulatory Visit: Payer: Self-pay

## 2015-12-19 ENCOUNTER — Telehealth: Payer: Self-pay

## 2015-12-19 DIAGNOSIS — M79606 Pain in leg, unspecified: Secondary | ICD-10-CM

## 2015-12-19 NOTE — Telephone Encounter (Signed)
Patient left VM . Contacted patient and she states she never heard from [facility]. Referral was sent 9/12 for PT and dry needling.

## 2015-12-19 NOTE — Telephone Encounter (Signed)
Notified patient new referral sent and rehab center notified.

## 2015-12-20 ENCOUNTER — Ambulatory Visit (INDEPENDENT_AMBULATORY_CARE_PROVIDER_SITE_OTHER): Payer: Medicare Other | Admitting: Pulmonary Disease

## 2015-12-20 ENCOUNTER — Encounter: Payer: Self-pay | Admitting: Pulmonary Disease

## 2015-12-20 VITALS — BP 140/76 | HR 62 | Temp 97.7°F | Ht <= 58 in | Wt 135.4 lb

## 2015-12-20 DIAGNOSIS — I2510 Atherosclerotic heart disease of native coronary artery without angina pectoris: Secondary | ICD-10-CM | POA: Diagnosis not present

## 2015-12-20 DIAGNOSIS — Z170 Estrogen receptor positive status [ER+]: Secondary | ICD-10-CM

## 2015-12-20 DIAGNOSIS — K5730 Diverticulosis of large intestine without perforation or abscess without bleeding: Secondary | ICD-10-CM

## 2015-12-20 DIAGNOSIS — K581 Irritable bowel syndrome with constipation: Secondary | ICD-10-CM | POA: Diagnosis not present

## 2015-12-20 DIAGNOSIS — C50512 Malignant neoplasm of lower-outer quadrant of left female breast: Secondary | ICD-10-CM

## 2015-12-20 DIAGNOSIS — Z23 Encounter for immunization: Secondary | ICD-10-CM

## 2015-12-20 DIAGNOSIS — K219 Gastro-esophageal reflux disease without esophagitis: Secondary | ICD-10-CM

## 2015-12-20 DIAGNOSIS — J301 Allergic rhinitis due to pollen: Secondary | ICD-10-CM | POA: Diagnosis not present

## 2015-12-20 DIAGNOSIS — F411 Generalized anxiety disorder: Secondary | ICD-10-CM

## 2015-12-20 DIAGNOSIS — E782 Mixed hyperlipidemia: Secondary | ICD-10-CM | POA: Diagnosis not present

## 2015-12-20 DIAGNOSIS — J4520 Mild intermittent asthma, uncomplicated: Secondary | ICD-10-CM

## 2015-12-20 DIAGNOSIS — R0989 Other specified symptoms and signs involving the circulatory and respiratory systems: Secondary | ICD-10-CM

## 2015-12-20 DIAGNOSIS — Z9989 Dependence on other enabling machines and devices: Secondary | ICD-10-CM

## 2015-12-20 DIAGNOSIS — G4733 Obstructive sleep apnea (adult) (pediatric): Secondary | ICD-10-CM

## 2015-12-20 DIAGNOSIS — I2583 Coronary atherosclerosis due to lipid rich plaque: Secondary | ICD-10-CM

## 2015-12-20 DIAGNOSIS — I10 Essential (primary) hypertension: Secondary | ICD-10-CM | POA: Diagnosis not present

## 2015-12-20 DIAGNOSIS — J3089 Other allergic rhinitis: Secondary | ICD-10-CM | POA: Diagnosis not present

## 2015-12-20 NOTE — Patient Instructions (Signed)
Today we updated your med list in our EPIC system...    Continue your current medications the same...  We gave you the 2017 flu vaccine today...  Stay as active as possible...  Call for any questions...  Let's plan a follow up visit in 26mo, sooner if needed for problems.Marland KitchenMarland Kitchen

## 2015-12-20 NOTE — Progress Notes (Signed)
Subjective:    Patient ID: [name], female    DOB: 08/20/1935, 80 y.o.   MRN: [id number]  HPI 80 y/o WF here for a follow up visit... she has multiple medical problems including AR & Asthma;  HBP;  CAD followed by [name] & s/p 3 vessel CABG 11/11 by DrGearhardt;  Hyperlipidemia followed in the [facility];  GERD/ IBS/ Divertics;  Breast Cancer diagnosed 12/12;  Fibromyalgia;  Chr HAs & dizziness...   ~  SEE PREV EPIC NOTES FOR OLDER DATA >>    EKG 9/13 showed NSR, rate64, NSSTTWA, NAD...   CXR 10/13 showed heart at upper lim of norm, prior CABG, mild hyperinflation, clear, NAD...  Abd Ultrasound 10/13 by [name] for eval CP showed> norm GB, liver, kidneys, & Ao- neg sonar...  LABS 10-12/13 showed Chems- wnl;  CBC- wnl  LABS 2/14:  FLP- ok x LDL=110;  TSH=1.33   CXR 4/14 showed norm heart size, s/p CABG, tort thor Ao, bilat apical pleural scarring, NAD...  CXR 10/15 showed normal heart size, s/p CABG, unchanged biapical pleural scarring, otherw clear lungs w/o infiltrates, NAD...   ~  December 30, 2013:  1wk ROV & post hosp check>  [name] was [facility] 10/29 - 12/27/13 by [facility] w/ HA, HBP, & hyponatremia (Na=119 ?etiology)> she had recently been treated for bronchitic exac w/ Levaquin & Mucinex, she thinks the HBP was a reaction "allergy" to Levaquin & doesn't want to take this antibiotic again; BP was 220 range in ER &treated w/ Cardene drip=> she was actually disch on less meds than PTA=> sdhe returned to ER the day after disch w/ HBP & BP ~200 & she hadn't taken her Losartan yet, given IV hyralazine & BP improved to the 160s; now on Metop50Bid & Cozaar50 (prev on Hyzaar100-12.5); they suspected her diuretic in her hyponatremia which corrected w/ IVF; she has a hx of HAs from DJD/neck and FM; We reviewed the following medical problems during today's office visit >>     AR/ Asthma>  not on any inhaled meds at present; denies cough, sput, ch in dyspnea, etc; she has Allegra, Nasonex, Mucinex,  Tessalon, MMW for her various symptoms...    HBP>  on Metop50Bid & Losar50 now w/ BP=146/82 today; denies visual changes, CP, palipit, dizziness, syncope, dyspnea, edema, etc; we decided to incr Losartan to 100 & add Hydralazine50-1/2Bid w/ ROV 2 wks...    CAD>  CP 11/11=> cath w/ in-stent restenosis, then CABG x3 by DrGearhardt; re-hospitalized 6/12 by Cards for CP> cath showed severe 3 vessel CAD w/ 3/3 grafts patent & norm LVF w/ EF= 65-70%; Myoview 5/13 showed no ischemia & EF>80%; no change in meds- on Imdur30; & DrNishan noted that med rx is lim by side effects.    Hyperlipid>  Intol to statins; followed in the [facility] & FLP on Cres10- 2d per week reviewed but she was unable to tolerate even this low dose; off all meds on diet alone now...    GI- GERD, Divertics> on Omep40 + Levsin0.125 & Align; followed by DrPatterson & seen 9/13 f/u for ?diverticulitis- improved after Cipro/Flagyl but CTAbd was neg x diverticulosis; he rec high fiber diet & Citrucel...    Hx left breast cancer> she had f/u Oncology 12/13- on Femara2.5 & tol ok; last mammogram 10/13 was ok as well...    Others>  she has persistant FM symptoms and chr HAs, uses Tramadol & Advil Prn... We reviewed prob list, meds, xrays and labs> see below for updates >>  CXR 10/15 showed fe chr changes in lungs, norm heart size & s/p CABG, DJD in spine, NAD.[name] KitchenMarland [name]  EKG 11/15 showed NSR, rate69, NSSTTWA, NAD...  Lab cumulative summary sheet reviewed...  PLAN>> we decided to incr Losartan to 171m/d and add Hydralazine50-1/2Bid, continue Metop50Bid and the Imdur30 as before; she knows to aqvoid excess sodium in her diet (esp now that hyponatremia resolved & she's off diuretic; ROV 2wks...    ~  January 14, 2014:  2wk ROV & another hospitalization in the interval> PFraser Dinhas had a continuing saga of HA & HBP, she has been to the ER, to Cardiology, Hospitalized, back to Cardiology, and almost daily phone calls for HA & HBP w/ BP ~200 sys> all notes reviewed;   Cards eval for secondary cause of the HBP has been neg;  Initially it was assumed that incr BP resulted in HBP but she has a hx of chronic daily HAs and she continues to have HAs when her BP is controlled, and it seems more likely that the pain contributes to her HBP;  She was last Hosp 11/11 - 01/06/14 and disch on ASA81, Imdur60, Hydralazine50Tid, Losar100-1/2Bid, Metop50Bid;  BP at disch was 100/47;  She saw DCherly Hensen11/16 on these meds w/ BP recorded 130/64, Imdur was stopped due to her HAs, they plan f/u 614mo She has called daily since then w/ elev BP (esp at night she says) and we have rec strategies using her current meds to help her headaches and adjust BP meds:    HBP> Rec to adjust meds- take Metop50Tid, Losartan100-1/2Tid, Apresoline50- take 1/2 to 1tab Tid (she wants to do this since BP is low in AM & she feels weak, with BP elev in PM & she needs more)...    Chronic Daily HAs> she needs f/u appt w/ Neurology w/ their active management of this problem; she has Zanaflex2m49m Cyclobenzaprine5mg24msked to try one of these taken Tid & if not improved then try the other Tid; rest, use heating pad on neck, and take Tramadol/ Tylenol up to 3 times daily as needed... We reviewed prob list, meds, xrays and labs> see below for updates >>   CXR 11/15 showed norm heart size, prev CABG, clear lungs- some hyperinflation/ NAD, DJD Tspine...  Renal Sonar 11/15 was wnl...  CT Head 11/15 showed cortical atrophy and small vessel dis, sinuses clear, NAD...  [name] KitchenMarland KitchenKG 11/15 showed NSR, rate64, borderline tracing w/?LAE & min NSSTTWA...  2DEcho 11/15 showed norm cavity size & thickness, norm EF= 60-65%, norm wall motion, norm diastolic function, essentially norm valves...   Myoview 11/15 showed low risk scan w/ abn EKG portion but no stress induced perfusion defects, hyperdynamic LVF w/ EF>70% & no regional wall motion abn  LABS 11/15:  Chems- wnl w/ Cr=0.6;  CBC- ok w/ Hg= 12.3;  TSH=1.60 PLAN>>  take  Metop50Tid, Losartan100-1/2Tid, Apresoline50- take 1/2 to 1tab Tid (she wants to do this since BP is low in AM & she feels weak, with BP elev in PM & she needs more); needs f/u appt w/ Neurology w/ their active management of this problem; she has Zanaflex2mg 48myclobenzaprine5mg- 51med to try one of these taken Tid & if not improved then try the other Tid; rest, use heating pad on neck, and take Tramadol/ Tylenol up to 3 times daily as needed...  ~  February 04, 2014:  3wk ROV & Pat reFraser Dints improved overall;  BP is better regulated but still sl volatile & she like the  ability to adjust the Hydralazine> on Metop50Tid, Cozaar100- 1/2 tid, & Hydralazine50- 1/2to1Tid;  BP today= 144/78 and when it was up>160/90 last PM she took an extra 87m dose...  She saw Neuro [name] for her chronic daily HAs 11/15 and she agreed w/ our Rx for BP control + Muscle relaxer (she prefers the Zanaflex over the Flexeril) + Tramadol/ Tylenol for pain...  She is c/o a jitteriness/ shaking on the inside feeling and we discussed trying a regular medication to try to help elim this sensation> try KLONOPIN 0.575m 1/2 to 1 tab Bid... She will f/u in 67m80mocall sooner prn problems...  ADDENDUM>> she never filled the Klonopin and refuses anxiolytic meds.  ~  April 13, 2014:  67mo78mo & Pat [name] had numerous medical follow up visits over the last 67mo>60moShe saw GI 12/15 w/ lower abd pain & nausea similar to prev bouts of diverticulitis in past;  CT Abd&Pelvis showed mild sigmoid diverticulitis, no evid of abscess or complications;  She was treated w/ Cipro & Flagyl & improved;  She is maintained on Prilosec, Anaspaz, fiber & metamucil...     She saw Neuro, [name] 1/16 due to headaches> long hx migraines, recurrent prob since 2014, now 15/30 days per month, CT Brain was neg x atrophy & sm vessel dis, MRI Cspine w/ multilevel DDD etc; treated w/ Tramadol, Flexeril, Zanaflex and [name] has ordered a Sleep Study- still pending & sched at end of  this month...     She also saw DrNishan for [facility] f/u 1/16> hx CAD, CABG 2011, HBP, HLD; Adm 10/15 w/ HBP & hyponatremia (Na=119), HCTZ was stopped, BP meds adjusted & now controlled on Metoprolol50Tid, Losar100-1/2Tid, Apres50-1/2Tid; BP today= 136/80, improved & she denies recent CP, palpit, dizzy, ch in SOB, edema;  2DEcho 11/15 showed norm LVF w/ EF=60-65% & no regional wall motion abn, trivial AI, otherw wnl; Lexiscan Myoview 11/15 showed low risk scan w/ abn EKG portion but no ischemic perfusion defects & hyperdynamic LV w/ EF=70% & no wall motion abn... We reviewed prob list, meds, xrays and labs> see below for updates >> we gave her the PREVNAR-13 today... [facility] she will continue her same meds and plan f/u in ~32mo w70mosting blood work at that time...   ~  May 03, 2014:  3wk ROV & pt called requesting urgent add-on appt for recurrent BP problems> Pt notes that BP has been well controlled on her regimen of low sodium, Metoprolol50Tid, Hydralazine50-1/2Tid, and Losartan100-1/2Tid> until 3d ago when she had the onset of another severe HA- assoc w/ the severe pain she noted that her BP was once again elevated (190-230/ 100-110 she says) & the usual doses of the 3 meds didn't bring it down; she tried taking the Tramadol50 as directed by [name] & added Tylenol plus her musc relaxers Flexeril & Zanaflex but the HA persisted; when she called [name]'s office she was directed to call us forKorean appt because of the BP... NOTE> it has been determined by her Cardiologist DrNishan & I concur that in her case it's the HA PAIN that causes her BP to spike, and relief of the pain is paramount to controlling the BP in these instances...  In this regard she is directed to maintain a baseline BP regimen w/ Metoprolol50Tid, Apres50-1/2Tid & Losar100-1/2Tid;  But when she has a HA she will immediately start her Neurology action plan for her severe headaches, and  check her BP  Tid w/ adjustment of her meds up to one of each tab Tid until the HA is resolved, then return to her baseline regimen... We reviewed this plan in detail & she will let us know if she has any questions...   CXR 3/16 showed norm heart size, s/p CABG, clear lungs, NAD.[name] KitchenMarland [name]   EKG 3/16 showed SBrady, rate53, NSSTTWA, NAD... PLAN>> continue baseline BP med regimen w/ METOPROLOL50Tid, APRESOLINE50-1/2Tid & LOSARTAN100-1/2Tid; continue to monitor BP at home; during episodes of severe HA pain- follow Neurologists HA action plan for control of the discomfort; in addition start to monitor BP 3 times daily & increase BP meds to 1 of each Tid until the HA is controlled & the BP returns to normal; then resume the baseline regimen...  ~  August 16, 2014:  3-96moROV & PFraser Dinhas continued w/ her regular array of medical specialist follow up visits> SEE BELOW    Asthma & allergies controlled on Allegra & Nasonex, no asthma attacks etc...    BP is doing better on current Metop50Tid, Losar100-1/2Tid, Apres50-1/2Tid w/o any elev readings at home...     She has had several follow up visits w/ GI> hx GERD, Divertics, IBS, Polyps> last bout of diverticulitis was 12/15 treated w/ Cipro & Flagyl; she recurrent pain 3/16- seen in ER where CT was neg but she improved w/ Antibiotic rx; she continues on Omep & they added Ranitadine; she's been on Levsin & they added benefiber; she had Ba swallow (neg), EGD (wnl) & Colon (divertics and 2 sessile polyps) by DrStark...     She saw her oncologist, DrMagrinat 6/16 for f/u of her breast cancer> Dx 10/12 & treated w/ lumpectomy & sentinel node bx, followed by XRT, the Femara for a planned 543yr(2 more yrs to go); they follow her BMDs etc...     She has a small cyst on her right index finger & I have suggested an Ortho/Hand eval, "I might want to see Rheumatology again" she says...    She's had an extensive Neuro eval from [name] & had a Neurology 2nd opinion consult 5/16 [facility]/ [facility]DKirby [facility] [place]-  "it's my neck" and she states nothing new was discovered "they didn't find anything"; she has Tramadol & Tylenol for pain... We reviewed prob list, meds, xrays and labs> see below for updates >>   Barium Swallow was neg- norm motility, no HH, etc...  EGD 5/16 by DrStark showed essentially normal endoscopy; rec to continue antireflux regimen & PPI Rx...  Colonoscopy 5/16 by DrStark showed diverticulosis, 2 sessile polyps (one tub adenoma, the other hyperplastic)...  LABS 6/16: Chems- ok x BS=147;  CBC- wnl;   PLAN>>  She is rec to continue her current med regimen and follow up w/ her specialists...  ~  December 20, 2014:  50m22moV & PatFraser Dindicates that she is the same, maintaining her regular f/u visits w/ various medical specialists>    Asthma & allergies controlled on Allegra & Nasonex, no asthma attacks etc & breathing is OK, she's been walking for exercise...    BP is doing better on current Metop50Tid, Losar100-1/2Tid, Apres50-1/2Tid w/o any elev readings at home; she saw DrNishan for Cards 8/16> HBP, CAD, CABG 2011, HL; nonischemic myoview2015, BP controlled, intol to statins.     She has had several follow up visits w/ GI> hx GERD, Divertics, IBS, Polyps> on Omep40, Zantac300, Anaspaz0.125 prn; last bout of diverticulitis was 12/15 treated w/ Cipro & Flagyl;  she had recurrent pain 3/16- seen in ER where CT was neg but she improved w/ Antibiotic rx; she had Ba swallow (neg), EGD (wnl) & Colon (divertics and 2 sessile polyps) by DrStark...     She saw her oncologist, DrMagrinat 6/16 for f/u of her breast cancer> Dx 10/12 & treated w/ lumpectomy & sentinel node bx, followed by XRT, the Femara for a planned 32yr (2 more yrs to go); they follow her BMDs etc...     She had routine GYN check by PatGrubb 7/16> note reviewed...    She has a small cyst on her right index finger & I have suggested an Ortho/Hand eval, "I might want to see Rheumatology again" she says...    She's  had an extensive Neuro eval from [name] & had a Neurology 2nd opinion [facility]16 [facility] [place]-  "it's my neck" and she states nothing new was discovered "they didn't find anything"; she has Tramadol & Tylenol for pain=> f/u [name] 8/16 & offered occipital nerve blocks; she had a 3rd opinion from DFrontier9/16> note reviewed. Chr daily HAs (occip neuralgia vs cervicogenic HAs), rec occip nerve blocks & PT, Gabapentin and f/u planned.    She also saw DrAthar for a Sleep Eval split night study 04/25/14 w/ AHI=16/hr w/ desat to 85% reported; she was titrated to CPAP 6cm H2O w/ AHI=0 and O2sat nadir=96%, snoring was eliminated; download from 8/16 looked good but she reported some mask issues which they are working on; she notes feeling sl better on the days that she wears the CPAP but HAs are about the same... We reviewed prob list, meds, xrays and labs> see below for updates >> given 2016 FLU vaccine today...  Vit D level 7/16 reported at 29 & she is rec to increase Vit D supplement to ~2000u daily...  [facility]  Lowest Tscore = -1.8 in [facility] and [facility] IMP/PLAN>>  PFraser Dinis stable on her current regimen; multisystem disease w/ numerous specialists tending to her needs and their systems; she has had several opinions from Neurologists regarding her chr daily HAs and cervicogenic HAs, she will f/u w/ DrTat regarding treatment options...   ~  June 20, 2015:  673moOV & PaFraser Dinontinues to have mult somatic complaints- weakness, not feeling well, chest pressure, cough w/ thick white sput, occas wheezing noted, and increased anxiety/jittery (esp w/ the Ventolin prescribed by drVanWinkle);  She states that her BP has been low & therefore she doesn't take all of her meds everyday, but this makes it hard for usKoreao adjust Rx since she does not keep detailed records of BP & wheat she is taking & when... We reviewed her interval specialist visits>>     Allergy- DrVanwinkle 04/27/15>  AR, asthma, GERD- on immunotherapy for >1543yrAllegra180, flonase, Nasonex, Saline, VentolinHFA, Omeprazole; FENO=27ppb & pt rec to f/u 69yr63yr   Cards- DrNishan 03/10/15>  HBP, CAD, CABG 2011, HL (intol to statins & refuses Rx);  She was Adm over halloween w/ hypertensive emergency (SBP>220) and hyponatremia (Na=119), HCT was stopped & she was suspected of taking pseudophed, Myoview & Echo were neg, mult med adjustments over that time-  Appears stable on ASA81, Metop50Tid, Amlod5, Losar100, Apres50-1/2Tid; BP today= 140/70 & she is doing better overall...     GI- JZehr 05/16/15>  Abd pain, heartburn, bloating, diarrhea; felt to have functional pain & IBS; EGD 5/16 was wnl; Colon 5/16 w/ mod divertics, &  tub adenoma removed; on Omep40 (changed to Protonix40), Zantac300, rec to add Probiotic daily & use Levsin prn...    She remains on Femara 2.93m/d from DrMagrinat- last seen 07/2014 7 note reviewed...    Rheum eval 2014 by DKathlynn Grate neck pain w/ NS consult DrHirsh as well- MRI CSpine showed multilevel DDD & some foraminal stenosis, not a surg cand; asked to f/u prn...    Outpt Rehab- mult visits (11) and disch 05/10/15>  Neck pain, musc spasm, decr ROM Cspine, abn posture; goals met & pt improved...    Neuro- DrJaffe 02/28/15>  Occipital neuralgia, given occipital nerve block w/ Maracaine & Kenalog...     DrAthar 04/26/15>  OSA on CPAP- good compliance documented, AHI on CPAP<1 on CPAP 6 w/ min leak; [facility] [place] [facility]  DrAquino 05/24/15>  F/u chr daily HAs, pt says occip nerve blocks w/o benefit, but she reports that PT helped (esp dry needling) & rec to continue home exercise program. EXAM shows Afeb, VSS, O2sat=96% on RA;  HEENT- neg, mallampati2;  Chest- clear w/o w/r/r;  Heart- RR Gr1/6SEM w/o r/g;  Abd- soft, nontender, norm BS, neg;  Ext- w/o c/c/e;  Neuro- sl decr ROM neck, +trigger pts, otherw neg.  CXR 06/20/15>  Borderline heart size, s/p CABGsl  hyperinflation, clear lungs- NAD...  Spirometry 06/20/15>  FVC=1.78 (90%), FEV1=1.26 (84%), %1sec=71%, mid-flows were sl reduced at 59% predicted...   LABS 06/20/15>  Chems- wnl;  CBC- wnl;  TSH=1.30... IMP/PLAN>>  PFraser Dinis well attended by her mult specialists and is stable to improved overall;  She is rec to continue her current meds, get plenty of exercise, and avoid stress... we plan recheck in 673mo.   ~  December 20, 2015:  83m77moV & PatFraser Dintes that her BP drops on occas down to 110/50 w/ pulse in the 50's & she will adjust her own meds if this occurs; regular meds include Amlodipine5, Metop50Tid, Cozaar100-1/2Tid, Hydralazine50-1/2Tid & she likes it this way so she can freq check BP 7 adjust as noted which empowers her sense of control over her health status; she lists MULT allergies/ drug sensitivities (12+listed)  including Hct (developed hyponatremia) & ACE inhibitors;  BP today is 140/76 & we rec continue same regimen...    She continues to see Allegy- DrVanWinkle yearly, seen 04/2015 w/ mild intermit asthma, AR, GERD;  FeNO=27, rec to continue the rescue inhaler as needed, Allegra, flonase, saline & Omeprazole; he thanked me for allowing him to participate in her care...     She had CARDS f/u 10/11/15 by DrNishan> HBP, Hx CAD, s/p CABG 2011, HL; non-ischemic myview in 2015- continue same meds...    She was recently seen in the ER 12/08/15 for Abd pain- epig area, awoke her at 4am w/ burning pain, sl nausea, hx gastritis, norm CT Abd in 2016;  Exam reported as neg- nontender;  Labs were OK;  EKG showed NSSTTWA;  Abd Sonar was normal as well;  She was given Zofran, MS, and Pepcid- improved;  Disch on her PPI rx, Carafate Qid, and Percocet5 prn...    She had a yearly GYN eval by DrAmundson & PatEdman Circle6/17- note reviewed, hx urethral prolapse, & rec to take spectrum Organic coconut oil EXAM shows Afeb, VSS, O2sat=97% on RA;  HEENT- neg, mallampati2;  Chest- clear w/o w/r/r;  Heart- RR Gr1/6SEM w/o  r/g;  Abd- soft, nontender, norm BS, neg;  Ext- w/o c/c/e;  Neuro- sl decr ROM neck, +trigger pts, otherw  neg.  LABS 11/2015 in epic reviewed>  Chems- wnl, BS=112;  CBC- wnl IMP/PLAN>>  Mult medical problems as listed- BP is contolled on her regimen and she monitors her BP regularly;  Cardiac stable & we discussed exercise program;  Cryptic abd discomfort, ER eval neg, no recurrence... Given Flu shot today & we plan ROV 38mo           Problem List:  GLAUCOMA (ICD-365.9) - she is sched for right cataract surg by DrBevis in Feb...  ALLERGIC RHINITIS (ICD-477.9) - she uses Allegra & Nasonex Prn; on Allergy shots weekly x yrs... ~  [facility] she remains on allergy shots...  ASTHMA & Asthmatic Bronchitis >> off Symbicort & using XOPENEX HFA as needed rescue inhaler... ~  5/11:  notes incr chest symptoms in the heat & rec to take the Symbicort 2sp Bid... ~  2012:  After her CABG & repeat hosp by Cards she is noted to be off her inhalers... ~  10/12:  Treated w/ Zpak, Pred, Mucinex, plus her Symbicort80 & improved... ~  CXR 5/13 showed s/p CABG, clear lungs, NAD (she went to the Er w/ cough & had neg eval). ~  7/13:  DrVanWinkle stopped her Symbicort & [facility] to [facility] as needed... ~  2/14: she does not list any inhaled meds at present & states breathing OK w/o exac... ~  4/14: she was hosp after pill asp w/ asthma exac & improved w/ supportive Rx, didn't require bronch, CXR was clear, grad improved post hosp... ~  CXR 4/14 showed normal heart size, s/p CABG, sl tort Ao, bilat apic pleural scarring, NAD...  ~  6/15: she is stable w/o asthma exac and has avoided URIs/ bronchitis/ etc... ~  10/15: presented w/ URI/ bronchitic exac w/ cough, yellow-green sput, congestion, hoarseness, etc; CXR w/o signs of pneumonia & treated w/ Depo, Levaquin, Mucinex, MMW, Delsym, etc...  ~  CXR 10/15 showed few chr changes in lungs,  norm heart size & s/p CABG, DJD in spine, NAD  HYPERTENSION (ICD-401.9) - controlled on TOPROL 536mid & HYZAAR 100-12.5 daily (off Lisinopril due to ACE cough)...  ~  8/12: BP=  128/84 & tol meds well; denies HA, fatigue, visual changes, CP, palipit, syncope, dyspnea, edema, etc... ~  2/13: BP= 140/64 & she remains essentially asymptomatic on Rx... ~  8/13:  BP= 132/64 & she denies CP, palpit, SOB, edema, etc... ~  2/14: controlled on Toprol & Hyzaar w/ BP 144/72 today; denies visual changes, CP, palipit, dizziness, syncope, dyspnea, edema, etc... ~  5/14:  BP= 136/60 & she remains stable... ~  8/14:  BP= 134/60 and she denies CP, palpit, dizzy, SOB, edema... ~  12/14: on Metop50Bid, Hyzaar 100-12.5, Imdur30;  BP= 140/80 & she remains largely asymptomatic... ~  6/15: on ASA81, Metop50Bid, Hyzaar100-12.5, Imdur30; BP today= 136/68 & she denies CP, palpit, SOB, dizzy, edema, etc... ~  10/15: on same meds w/ BP= 160/78 & no changes meds- advised to monitor BP at home once she's over the URI illness, take meds regualrly... ~  11/15: post Hosp for HBP, hyponatremia; they stopped diuretic & disch on Metop50Bid & Losar50; she went back to ER the next day w/ BP~200 (hadn't taken her Losartan) & given IV Hydralazine w/ improvement to 160s; we decided to continue the BBlocker, increase the ARB to 10026m, and ADD HYDRALAZINE50- 1/2Bid for now; ROV 2 weeks... ~  11/15: she is s/p  ER, Hosp, Cards visits & mult phone calls (see above)> meds ajusted- take Metop50Tid, Losartan100-1/2Tid, Apresoline50- 1/2 to 1tab Tid (she wants to do this since BP is low in AM & she feels weak, with BP elev in PM & she needs more)... ~  2/16: BP meds adjusted over the last few months & BP controlled on Metoprolol50Tid, Losar100-1/2Tid, Apres50-1/2Tid; BP today= 136/80, improved & she denies recent CP, palpit, dizzy, ch in SOB, edema... ~  3/16: pt presented w/ BP elev during recurrent bouts of HA pain- asked to increase her above  meds to Metop50/ Losar100/ Apres50- at her Tid dosing intervals when needed for HBP while her HA meds are kicking in to resolve her pain; if her pain doesn't resolve & her BP does not improve, then she knows to go to the ER for immediate assessment...   CORONARY ARTERY DISEASE (ICD-414.00) - on ASA 21m/d (intol to 325 she says) & off Plavix per DCherly [name]..  Cardiolite 9/08 showed CP & HBP response, EF+85%... had cath 10/08 w/ single vessel dis w/ mod ostial LAD stenosis and patent LAD stent, normal LVF..[name] option for med Rx vs off pump LIMA... she notes some CP/ pressure "burning" when walking up a certain hill during her daily walks, but states this is stable and "no worse"... ~  Myoview 9/09 was neg- no scar or ischemia, EF= 89%, hypertensive response- Lisinopril10 added. ~  f/u Myoview 3/11 was neg- no scar or ischemia, EF= 86%, norm BP response, fair exerc capacity. ~  saw DCherly Hensen5/11- no CP, he stopped her Plavix, stable- continue other meds same. ~  She had recurrent CP 11/11 w/ in-stent restenosis on cath> subseq 3 vessel CABG by DrGearhart & doing well since then... ~  6/12:  Hosp w/ CP & repeat cath showed CAD & 3/3 grafts patent, EF= 65-70% ~  Myoview 5/13 was felt to be a normal stress nuclear study- normal wall motion, no ischemia, EF=84% ~  2/14: CP 11/11=> cath w/ in-stent restenosis, then CABG x3 by DrGearhardt; re-hospitalized 6/12 by Cards for CP> cath showed severe 3 vessel CAD w/ 3/3 grafts patent & norm LVF w/ EF= 65-70%; Myoview 5/13 showed no ischemia & EF>80%; no change in meds; & DCherly Hensennoted that med rx is limited by side effects... ~  8/14 & 12/14: she remains stable on Imdur30 + Metop50Bid 7 Hyzaar100-12.5; denies CP, palpit, SOB, etc... ~  6/15: Cards f/u w/ DrNishan 1/15> HBP, CAD, HL; on ASA81, Metop50Bid, Hyzaar100-12.5, Imdur30; Hx neg Myoview 5/13 & doing satis- no changes made; BP today= 136/68 & she denies CP, palpit, SOB, dizzy, edema, etc ~  EKG 11/15 showed NSR,  rate69, NSSTTWA, NAD  ~  2DEcho 11/15 showed  norm LVF w/ EF=60-65% & no regional wall motion abn, trivial AI, otherw wnl. ~  Myoview 11/15 showed low risk scan w/ abn EKG portion but no stress induced perfusion defects, hyperdynamic LVF w/ EF>70% & no regional wall motion abn. ~  11/15: she is off the Imdur per DrNishan due to HAs... ~  1/16: she had f/u DrNishan> stable on meds and no changes made...  HYPERLIPIDEMIA (ICD-272.4) - on CRESTOR 156m 1/2 on M&F, FISH OIL 120076mid + Flax Seed Oil... she has been intol to statins in the past>  tried low dose Trilipix but stopped this due to "thigh burning">  now followed in the [facility]id Clinic--- ~  FLPR[place]08 showed TChol 217, TG 236, HDL 46, LDL 150...  ~  FLPCameron09 showed  TChol 233, TG 263, HDL 46, LDL 150... she agrees to try LOW DOSE Trilipix. ~  FLP 11/09 showed TChol 162, TG 167, HDL 45, LDL 83... ~  [place] 11/10 showed TChol 240, TG 271, HDL 45, LDL 150... LipClin Rx w/ FishOil & FlaxSeedOil. ~  FLP 2/11 showed TChol 285, TG 156, HDL 79, LDL 180... LC is aware & working w/ her regularly. ~  [place] 5/11 showed TChol 277, TG 241, HDL55, LDL 193...  LC added Cres10 just 2d per week. ~  FLP 10/11 showed TChol 198, TG 215, HDL 53, LDL 109... much improved on Cres10 twice weekly. ~  She continues to f/u w/ Lipid clinic & they titrated up the Cres10 on M&F, plus 27m on Wednes> ~  FLP  10/12 was the best yet w/ TChol 167, TG 203, HDL 54, LDL 88...  ~  FDunsmuir4/13 showed TChol 193, TG 176, HDL 57, LDL 101 ~  [facility] (at that time on Cres10 on M&F, 58mon W) but she subseq cut herself down to 1/2 on M&F due to nausea & aching. ~  FLP 2/14 on diet alone showed TChol 194, TG 143, HDL 55, LDL 110... We reviewed diet, exercise, etc... ~  She remains off the [place] diet alone and she declines to restart low dose rx "I can't tolerate them"  GERD (ICD-530.81) - on OMEPRAZOLE 4022m... last EGD by DrPatterson 8/07 revealed sm  HH, & gastritis and dilatation done... (RUT=neg, PPI Rx)... LEVSIN 0.125m49mlps her esoph spasm... Prn Phenergan for nausea.  DIVERTICULOSIS, COLON (ICD-562.10) & IRRITABLE BOWEL SYNDROME (ICD-564.1) ~  colonoscopy 2/03 by DrPatterson showed divertics and 5 mm polyp... ~  colonoscopy 3/11 showed divertics & cecal polyp= tubular adenoma, w/ f/u planned 30yrs33yr 9/13: seen by DrPatterson f/u for ?diverticulitis- improved after Cipro/Flagyl but CTAbd was neg x diverticulosis; he rec high fiber diet & Citrucel...  ? GALLSTONES>>  Diagnosed by DrPatterson and referred to [facility] DrWilson, pt asked to call prn fopr incr symptoms to consider surg... ~  Abd Ultrasound 10/13 by DrNisCherly Henseneval CP showed> norm GB, liver, kidneys, & Ao- neg sonar.  BREAST CANCER >> Abn mammogram 10/12 showing a left breast nodule & subseq surg (partial mastectomy & sentinel node bx) by [name] 12/12 that proved to be an invasive ductal carcinoma, 2.3cm size, w/ neg lymphovasc invasion, clear margins (but close at 1mm),36mneg nodes; she had XRT by DrKindard (had her 11th treatment today); and Oncology eval by DrMagrinat who plans hormone Rx later (no chemo she says)...  ~  2/13: She is tolerating treatment well & spirits are up, [name] had to aspirate a seroma & it is now resolved... ~  She is followed by DrMagrinat, StreckMargot Chimesrd- [facility]m30yrs pl64yrd... ~  6/14:  She had f/u DrMagrinat 6/14> his note is reviewed- s/p lumpectomy, sentinel node, XRT; plan is to continue Femara2.5mg thru42m16 for 30yrs rx. 65yr0/14:  She had routine f/u [name] 10/14> Hx left breast cancer dx 10/12 & treated w/ lumpectomy, XRT, & now Femara; exam neg, f/u mammogram neg, rec f/u 30yr. ~  6/75yr She saw Oncology for f/u of her left Breast Cancer> on Femara2.5 & tol well, s/p lumpectomy & sentinel node bx 12/12, followed by XRT finished 3/13, & on Letrozole since then, no known recurrence...   DJD, Neck Pain >> ~  XRays Cspine  4/14 in  hosp showed multilevel cerv spondylosis, 1-21m anterolisthesis C4 on C5, DDD, foraminal stenosis at C5-6...  ~  5/14:  MRI Cspine => multilevel facet hypertrophy, foraminal narrowing & stenosis, disc osteophyte complexes, some central canal stenosis=> she was sent to NS, DrHirsh who rec Mobic & PT (helped alittle) but she was not pleased... ~  8/14:  She is requesting a Rheumatology 2nd opinion about her neck discomfort=> [facility], this exac HAs/migraines, dizzy/ light headed; MRI=> NS consult & not a surg cand & NSAIDs/ PT w/ min benefit; saw Neurology- tried Pamelor but feels hung-over; for her DDD & OA they rec Flex569mid which has helped some...  FIBROMYALGIA (ICD-729.1) - she c/o chr fatigue, aching/ sore, on Tramadol & Tylenol, but most benefit from low dose Hydrocodone ~1/2 tab Prn... I have recommended an increase exercise program to her... ~  5/11:  Vit D level = 50 on 50000 u weekly by PaHelene ShoeNP  HEADACHE (ICD-784.0) - [facility], but she notes Rx didn't help... ~  3/11:  presented w/ muscle contraction HA's> Rx Tramadol 5054m6H Prn. ~  She has persistent/ recurrent HAs... ~  11/15: her chronic daily HAs have again risen in improtance due to interaction w/ her HBP> asked to f/u w/ Neuro ASAP & Rx w/ rest, heating pad, Tramadol50/ Tylenol, and musc relaxer- Zanaflex2Tid vs Flexeril5Tid...  DIZZINESS, CHRONIC (ICD-780.4) - MRI 2/09 per DrNCherly Hensenowed atrophy, sm vessel dis, NAD...  Health Maintenance: ~  GI:  DrPatterson & up to date on colon screening. ~  GYN:  yearly f/u w/ PatHelene ShoeP for PAP, [facility] ~  Immunizations:  yearly Flu vaccines in fall, Pneumovax in 2007?, TDAP given 5/11...   Past Surgical History:  Procedure Laterality Date  . BREAST LUMPECTOMY Left 02/06/11  . CATARACT EXTRACTION, BILATERAL     bilateral caaract removal,  . CORONARY ANGIOPLASTY WITH STENT PLACEMENT     Stent  2007  . CORONARY ARTERY BYPASS GRAFT    . open heart surgery     01/19/2010    Outpatient Encounter Prescriptions as of 12/20/2015  Medication Sig  . Acetaminophen (TYLENOL 8 HOUR PO) Take by mouth as directed.   . aMarland Kitchenbuterol (VENTOLIN HFA) 108 (90 Base) MCG/ACT inhaler Inhale 2 puffs into the lungs every 6 (six) hours as needed for wheezing or shortness of breath.  . aMarland KitchenLODipine (NORVASC) 5 MG tablet take 1 tablet by mouth once daily  . aspirin EC 81 MG tablet Take 81 mg by mouth daily.  . fexofenadine (ALLEGRA) 180 MG tablet Take 180 mg by mouth daily.    . fluocinonide (LIDEX) 0.05 % external solution Apply 1 application topically daily as needed (scalp itching).   . Glucosamine-Chondroit-Vit C-Mn (GLUCOSAMINE CHONDR 1500 COMPLX PO) Take 1 tablet by mouth 3 (three) times daily.    . hydrALAZINE (APRESOLINE) 50 MG tablet take 1/2 tablet by mouth three times a day  . hyoscyamine (ANASPAZ) 0.125 MG TBDP disintergrating tablet Place 0.125 mg under the tongue every 6 (six) hours as needed for bladder spasms or cramping.  . ibandronate (BONIVA) 150 MG tablet Take 150 mg by mouth every 30 (thirty) days. Take in the morning with a full glass of water, on an empty stomach, and do not take anything else by mouth or lie down for the next 30 min.  . kMarland Kitchentoconazole (NIZORAL) 2 % shampoo Apply 1 application topically 2 (two) times a week. As needed for  itching a couple of times week  . latanoprost (XALATAN) 0.005 % ophthalmic solution Place 1 drop into both eyes at bedtime.   [name] letrozole (FEMARA) 2.5 MG tablet Take 1 tablet (2.5 mg total) by mouth daily.  [name] losartan (COZAAR) 100 MG tablet take 1/2 tablet by mouth three times a day  . magnesium oxide (MAG-OX) 400 MG tablet Take 400 mg by mouth daily. Reported on 02/14/2015  . metoprolol (LOPRESSOR) 50 MG tablet take 1 tablet by mouth three times a day  . mometasone (NASONEX) 50 MCG/ACT nasal spray Place 2 sprays into the nose daily as needed (congestion).    . nitroGLYCERIN (NITROSTAT) 0.4 MG SL tablet Place 1 tablet (0.4 mg total) under the tongue every 5 (five) minutes as needed for chest pain.  . Omega-3 Fatty Acids (FISH OIL) 1200 MG CAPS Take 1,200 mg by mouth daily. Reported on 02/14/2015  . OVER THE COUNTER MEDICATION daily.  [name] oxyCODONE-acetaminophen (PERCOCET/ROXICET) 5-325 MG tablet Take 2 tablets by mouth every 4 (four) hours as needed.  . pantoprazole (PROTONIX) 40 MG tablet take 1 tablet by mouth once daily  . ranitidine (ZANTAC) 300 MG capsule Take 1 capsule (300 mg total) by mouth every evening.  . sucralfate (CARAFATE) 1 g tablet Take 1 tablet (1 g total) by mouth 4 (four) times daily.  . vitamin B-12 (CYANOCOBALAMIN) 1000 MCG tablet Take 1,000 mcg by mouth daily.     No facility-administered encounter medications on file as of 12/20/2015.     Allergies  Allergen Reactions  . Choline Fenofibrate Other (See Comments)     pt states INTOL to Trilipix w/ "thigh burning"  . Simvastatin Other (See Comments)     pt states INTOL to STATINS \T\ refuses to restart  . Bentyl [Dicyclomine Hcl]     Made me feel weird and drained me  . Hctz [Hydrochlorothiazide]     Causes hyponatremia  . Hydrocodone     Nightmare after taking cough syrup w/hydrocodone  . Levaquin [Levofloxacin In D5w]     Elevated BP  . Adhesive [Tape] Rash  . Ceclor [Cefaclor] Rash  . Clarithromycin Rash  . Codeine Nausea Only  . Doxycycline Rash  . Lisinopril Cough    Developed ACE cough...  . Penicillins Itching and Rash    At injection site  . Tobramycin-Dexamethasone Rash    Current Medications, Allergies, Past Medical History, Past Surgical History, Family History, and Social History were reviewed in [employer] record.    Review of Systems    See HPI - all other systems neg except as noted...  The patient complains of headaches.  The patient denies anorexia, fever, weight loss, weight gain, vision loss, decreased hearing,  hoarseness, chest pain, syncope, dyspnea on exertion, peripheral edema, prolonged cough, hemoptysis, abdominal pain, melena, hematochezia, severe indigestion/heartburn, hematuria, incontinence, muscle weakness, suspicious skin lesions, transient blindness, difficulty walking, depression, unusual weight change, abnormal bleeding, enlarged lymph nodes, and angioedema.   Objective:   Physical Exam    WD, WN, 80 y/o WF in NAD... GENERAL:  Alert & oriented; pleasant & cooperative... HEENT:  [place]/AT, EOM-wnl, PERRLA, EACs-clear, TMs-wnl, NOSE-clear, THROAT- clear & wnl. NECK:  Decr ROM; no JVD; normal carotid impulses w/o bruits; no thyromegaly or nodules palpated; no lymphadenopathy. CHEST:  Clear- no wheezing, rales, or signs of consolidation... HEART:  Regular Rhythm; without murmurs/ rubs/ or gallops detected... ABDOMEN:  Soft & nontender; normal bowel sounds; no organomegaly or masses palpated... EXT: without deformities, mild arthritic changes  and +trigger points, no varicose veins/ venous insuffic/ or edema. NEURO:  CN's intact; motor testing normal; sensory testing normal; gait normal & balance OK. DERM:  No lesions noted; no rash etc...  RADIOLOGY DATA:  Reviewed in the EPIC EMR & discussed w/ the patient...  LABORATORY DATA:  Reviewed in the EPIC EMR & discussed w/ the patient...   Assessment & Plan:    Asthma/ AR>  Stable on allergy shots & XOPENEX as needed; no recent exac... Hx pill asp w/ asthma exac in past => resolved...  HBP>  Controlled on BP med regimen of Metoprolol50Tid, Losartan100-1/2Tid, Hydralazine50-1/2Tid... BP is noted to be up when she has pain, esp HA pain; Headaches are managed by Luvenia Redden on Tramadol, Flexeril, Zanaflex... 3/16> she presented w/ severe HA and assoc elev BP readings- we rec increasing her 3 meds to 1 of each Tid to manage the BP until the pain abates... 10/16> BP has been well controlled on her 3 med regimen...  CAD>  Cath w/ patent grafts;  had neg Nuclear study; continue same meds and f/u DrNishan...  CHOL>  Followed in the Lipid Clinic & intol to all meds- FLP looks better however on diet alone...  GI> GERD, Divertics, ?Gallstones>  Followed by [facility] & stones eval by DrWilson [facility]; odd that f/u sonar 9/13 did not show stones...  BREAST CANCER>  on FEMARA > treated by [name], DrMagrinat, DrKinard & records reviewed...  FM/ HAs/ etc>  On Ultram, Tylenol, etc => needs active management of this prob by Neuro w/ an "action plan" for severe HA episodes... NECK PAIN> with multilevel spondylosis & eval by NS, DrHirsh but she was not pleased & is asking for Rheum eval...  Other medical issues as noted> on CoQ10, Glucosamine, Vit B12, Vit D, etc...   Patient's Medications  New Prescriptions   No medications on file  Previous Medications   ACETAMINOPHEN (TYLENOL 8 HOUR PO)    Take by mouth as directed.    ALBUTEROL (VENTOLIN HFA) 108 (90 BASE) MCG/ACT INHALER    Inhale 2 puffs into the lungs every 6 (six) hours as needed for wheezing or shortness of breath.   AMLODIPINE (NORVASC) 5 MG TABLET    take 1 tablet by mouth once daily   ASPIRIN EC 81 MG TABLET    Take 81 mg by mouth daily.   FEXOFENADINE (ALLEGRA) 180 MG TABLET    Take 180 mg by mouth daily.     FLUOCINONIDE (LIDEX) 0.05 % EXTERNAL SOLUTION    Apply 1 application topically daily as needed (scalp itching).    GLUCOSAMINE-CHONDROIT-VIT C-MN (GLUCOSAMINE CHONDR 1500 COMPLX PO)    Take 1 tablet by mouth 3 (three) times daily.     HYDRALAZINE (APRESOLINE) 50 MG TABLET    take 1/2 tablet by mouth three times a day   HYOSCYAMINE (ANASPAZ) 0.125 MG TBDP DISINTERGRATING TABLET    Place 0.125 mg under the tongue every 6 (six) hours as needed for bladder spasms or cramping.   IBANDRONATE (BONIVA) 150 MG TABLET    Take 150 mg by mouth every 30 (thirty) days. Take in the morning with a full glass of water, on an empty stomach, and do not take anything else by mouth or lie down for  the next 30 min.   KETOCONAZOLE (NIZORAL) 2 % SHAMPOO    Apply 1 application topically 2 (two) times a week. As needed for itching a couple of times week   LATANOPROST (XALATAN) 0.005 % OPHTHALMIC SOLUTION  Place 1 drop into both eyes at bedtime.    LETROZOLE (FEMARA) 2.5 MG TABLET    Take 1 tablet (2.5 mg total) by mouth daily.   LOSARTAN (COZAAR) 100 MG TABLET    take 1/2 tablet by mouth three times a day   MAGNESIUM OXIDE (MAG-OX) 400 MG TABLET    Take 400 mg by mouth daily. Reported on 02/14/2015   METOPROLOL (LOPRESSOR) 50 MG TABLET    take 1 tablet by mouth three times a day   MOMETASONE (NASONEX) 50 MCG/ACT NASAL SPRAY    Place 2 sprays into the nose daily as needed (congestion).    NITROGLYCERIN (NITROSTAT) 0.4 MG SL TABLET    Place 1 tablet (0.4 mg total) under the tongue every 5 (five) minutes as needed for chest pain.   OMEGA-3 FATTY ACIDS (FISH OIL) 1200 MG CAPS    Take 1,200 mg by mouth daily. Reported on 02/14/2015   OVER THE COUNTER MEDICATION    daily.   OXYCODONE-ACETAMINOPHEN (PERCOCET/ROXICET) 5-325 MG TABLET    Take 2 tablets by mouth every 4 (four) hours as needed.   PANTOPRAZOLE (PROTONIX) 40 MG TABLET    take 1 tablet by mouth once daily   RANITIDINE (ZANTAC) 300 MG CAPSULE    Take 1 capsule (300 mg total) by mouth every evening.   SUCRALFATE (CARAFATE) 1 G TABLET    Take 1 tablet (1 g total) by mouth 4 (four) times daily.   VITAMIN B-12 (CYANOCOBALAMIN) 1000 MCG TABLET    Take 1,000 mcg by mouth daily.    Modified Medications   No medications on file  Discontinued Medications   No medications on file

## 2015-12-25 ENCOUNTER — Ambulatory Visit (HOSPITAL_BASED_OUTPATIENT_CLINIC_OR_DEPARTMENT_OTHER): Payer: Medicare Other | Admitting: Oncology

## 2015-12-25 DIAGNOSIS — M8580 Other specified disorders of bone density and structure, unspecified site: Secondary | ICD-10-CM

## 2015-12-25 DIAGNOSIS — Z79811 Long term (current) use of aromatase inhibitors: Secondary | ICD-10-CM

## 2015-12-25 DIAGNOSIS — C50512 Malignant neoplasm of lower-outer quadrant of left female breast: Secondary | ICD-10-CM | POA: Diagnosis not present

## 2015-12-25 DIAGNOSIS — Z853 Personal history of malignant neoplasm of breast: Secondary | ICD-10-CM

## 2015-12-25 MED ORDER — LETROZOLE 2.5 MG PO TABS: 2.5000 mg | ORAL_TABLET | Freq: Every day | ORAL | 3 refills | Status: DC

## 2015-12-25 NOTE — Progress Notes (Signed)
ID: [name]   DOB: August 08, 1935  MR#: [id number]  DDU#:[id number]  PCP: [name] Space, MD GYN: [name] Boroughs NP SU: [name] OTHER MD: [name], [name], [name], [name]  CHIEF COMPLAINT:  Hx of Left  Breast Cancer  CURRENT TREATMENT: Completing 5 years of aromatase inhibitors   HISTORY OF PRESENT ILLNESS: From the original intake note:  [name] is a [place] woman originally referred by Dr. Marcelo [name] for evaluation and treatment in the setting of newly diagnosed breast cancer.   The patient had routine screening mammography June 2011 which was unremarkable.  Repeat screening mammography December 11, 2010, at [name], however, showed a possible abnormality in the left breast.  Additional imaging on October 18th showed an irregular, lobulated mass in the lower outer quadrant of the left breast which by ultrasound was irregular and hypoechoic.  It measured 2.5 cm by ultrasound.  The left axilla showed a benign-looking lymph node which did not appear to have changed compared to as seen in prior mammograms.   With this information, the patient underwent biopsy of the left breast mass October 25th, and this showed [phone] an invasive mammary carcinoma with some lobular features but strongly E-cadherin positive, and so an invasive ductal carcinoma.  There was evidence of perineural invasion, although no definite evidence of angiolymphatic invasion.  The tumor had a CISH ratio of HER-2 to CEP17 signals of 1.15, indicating no amplification.  Estrogen receptor was positive at 100%, progesterone receptor was positive at 61%, and the proliferation marker was 72%.   With this information, the patient was referred to Dr. Margot [name], and bilateral breast MRIs were obtained December 24, 2010.  This showed the mass in the left breast to measure 1.7 cm, to be solitary, to be not contacting the pectoralis, and in addition, there was no suspicious finding in the  right breast, no pathologically enlarged axillary or internal mammary chain lymph nodes.   Her subsequent history is as detailed below.   INTERVAL HISTORY: [name] returns today for follow-up of her estrogen receptor positive breast cancer. She tolerates letrozole well. Hot flashes and vaginal dryness are not a major issue. She never developed the arthralgias or myalgias that many patients can experience on this medication. She obtains it at a [name] price.  REVIEW OF SYSTEMS: [name] has "[name] problems" but is otherwise "doing fine". She has rare headaches, which she attributes to sinus problems usually gets this time of year. She was recently seen in the emergency room because of "stomach pain" this was felt most likely to be secondary to gastritis and she was asked to stop nonsteroidals. Aside from these issues a detailed review of systems today was noncontributory  PAST MEDICAL HISTORY: Past Medical History:  Diagnosis Date  . Asthma   . Atrophic vaginitis   . Breast cancer, Left 12/20/2010   NO BLOOD PRESSURE CHECKS OR STICKS IN LEFT ARM  . CAD (coronary artery disease)    a. 01/19/2010 s/p CABG x 3, [name]->lad, vg->diag, vg->om1;  b. 06/2011 :Lexi MV: EF 84%, No ischemia. c. 01/06/14 s/p negative nuclear stress test with EF >70%  . Cervical arthritis ([place])   . Colonic polyp 04-27-2009   tubular adenoma  . Diverticulosis of colon (without mention of hemorrhage)   . Fibromyalgia   . Gallstones   . GERD (gastroesophageal reflux disease)   . Glaucoma   . Headache(784.0)    a. frequently assocaited with high BPs  . Hiatal hernia   .  Hypercholesterolemia   . Irritable bowel syndrome   . Labile hypertension   Coronary artery disease status post open heart surgery under [facility] November 2011, with bypasses from the left internal mammary artery to the left anterior descending, and then reverse saphenous vein graft to the diagonal coronary artery.  The patient had previously had stents placed  by Dr. Johnsie Cancel in 2007.  Other problems include a history of reflux esophagitis, hypertension, hyperlipidemia, rosacea and gallstones.  PAST SURGICAL HISTORY: Past Surgical History:  Procedure Laterality Date  . BREAST LUMPECTOMY Left 02/06/11  . CATARACT EXTRACTION, BILATERAL     bilateral caaract removal,  . CORONARY ANGIOPLASTY WITH STENT PLACEMENT     Stent 2007  . CORONARY ARTERY BYPASS GRAFT    . open heart surgery     01/19/2010    FAMILY HISTORY Family History  Problem Relation Age of Onset  . Stroke Mother   . Stroke Father   . Prostate cancer Father   . Breast cancer Sister   . Diabetes Sister   . Cancer Brother     gland cancer  . Heart disease Brother   . Colon cancer Neg Hx   . Stomach cancer Neg Hx   . Pancreatic cancer Neg Hx   . Kidney disease Neg Hx   . Liver disease Neg Hx   The patient's father died at the age of 2 following a stroke.  The patient's mother died at 47 with a history of Alzheimer disease.  The patient had 2 sisters, one of whom developed breast cancer at the age of 45 and survives.  The patient has a brother who apparently had lymphoma.  He is also a survivor.  GYNECOLOGIC HISTORY:  (Reviewed 07/27/2013) Menarche age 37.  Menopause in the mid 1990s.  She never used hormone replacement.  She is a GX, P2.    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  Her husband, [name],  has worked as a Dealer and is still very active in his shop.   Daughter, [name], lives in [place] and is a home school mom.  Daughter, [name],  lives in [place] and works as a Consulting civil engineer for the [employer].  The patient attends the [employer] in [place].   ADVANCED DIRECTIVES: Not in place  HEALTH MAINTENANCE:  (Updated 07/27/2013) Social History  Substance Use Topics  . Smoking status: Never Smoker  . Smokeless tobacco: Never Used  . Alcohol use No      Colonoscopy: 2011/[name]  PAP: Not on file  B[facility] density: October 2014 [name], osteopenia with T score -1.4  Lipid panel: February 2014/[name]  Allergies  Allergen Reactions  . Choline Fenofibrate Other (See Comments)     pt states INTOL to Trilipix w/ "thigh burning"  . Simvastatin Other (See Comments)     pt states INTOL to STATINS' \\T'$ \ refuses to restart  . Bentyl [Dicyclomine Hcl]     Made me feel weird and drained me  . Hctz [Hydrochlorothiazide]     Causes hyponatremia  . Hydrocodone     Nightmare after taking cough syrup w/hydrocodone  . Levaquin [Levofloxacin In D5w]     Elevated BP  . Adhesive [Tape] Rash  . Ceclor [Cefaclor] Rash  . Clarithromycin Rash  . Codeine Nausea Only  . Doxycycline Rash  . Lisinopril Cough    Developed ACE cough...  . Penicillins Itching and  Rash    At injection site  . Tobramycin-Dexamethasone Rash    Current Outpatient Prescriptions  Medication Sig Dispense Refill  . Acetaminophen (TYLENOL 8 HOUR PO) Take by mouth as directed.     [name] albuterol (VENTOLIN HFA) 108 (90 Base) MCG/ACT inhaler Inhale 2 puffs into the lungs every 6 (six) hours as needed for wheezing or shortness of breath.    [name] amLODipine (NORVASC) 5 MG tablet take 1 tablet by mouth once daily 30 tablet 4  . aspirin EC 81 MG tablet Take 81 mg by mouth daily.    . fexofenadine (ALLEGRA) 180 MG tablet Take 180 mg by mouth daily.      . fluocinonide (LIDEX) 0.05 % external solution Apply 1 application topically daily as needed (scalp itching).     . Glucosamine-Chondroit-Vit C-Mn (GLUCOSAMINE CHONDR 1500 COMPLX PO) Take 1 tablet by mouth 3 (three) times daily.      . hydrALAZINE (APRESOLINE) 50 MG tablet take 1/2 tablet by mouth three times a day 45 tablet 2  . hyoscyamine (ANASPAZ) 0.125 MG TBDP disintergrating tablet Place 0.125 mg under the tongue every 6 (six) hours as needed for bladder spasms or cramping.    [name] ketoconazole (NIZORAL) 2 % shampoo Apply 1 application  topically 2 (two) times a week. As needed for itching a couple of times week    . latanoprost (XALATAN) 0.005 % ophthalmic solution Place 1 drop into both eyes at bedtime.     [name] letrozole (FEMARA) 2.5 MG tablet Take 1 tablet (2.5 mg total) by mouth daily. 90 tablet 3  . losartan (COZAAR) 100 MG tablet take 1/2 tablet by mouth three times a day 45 tablet 5  . magnesium oxide (MAG-OX) 400 MG tablet Take 400 mg by mouth daily. Reported on 02/14/2015    . metoprolol (LOPRESSOR) 50 MG tablet take 1 tablet by mouth three times a day 270 tablet 1  . mometasone (NASONEX) 50 MCG/ACT nasal spray Place 2 sprays into the nose daily as needed (congestion).     . nitroGLYCERIN (NITROSTAT) 0.4 MG SL tablet Place 1 tablet (0.4 mg total) under the tongue every 5 (five) minutes as needed for chest pain. 25 tablet 3  . Omega-3 Fatty Acids (FISH OIL) 1200 MG CAPS Take 1,200 mg by mouth daily. Reported on 02/14/2015    . pantoprazole (PROTONIX) 40 MG tablet take 1 tablet by mouth once daily 30 tablet 1  . ranitidine (ZANTAC) 300 MG capsule Take 1 capsule (300 mg total) by mouth every evening. 30 capsule 3  . sucralfate (CARAFATE) 1 g tablet Take 1 tablet (1 g total) by mouth 4 (four) times daily. 60 tablet 0  . vitamin B-12 (CYANOCOBALAMIN) 1000 MCG tablet Take 1,000 mcg by mouth daily.       No current facility-administered medications for this visit.     OBJECTIVE:  Older white woman In no acute distress  Vitals:   12/25/15 1344  BP: (!) 144/64  Pulse: 63  Resp: 17  Temp: 97.8 F (36.6 C)     Body mass index is 28.88 kg/m.    ECOG FS: 1 Filed Weights   12/25/15 1344  Weight: 138 lb 3.2 oz (62.7 kg)   Sclerae unicteric, EOMs intact Oropharynx clear and moist No cervical or supraclavicular adenopathy Lungs no rales or rhonchi Heart regular rate and rhythm Abd soft, nontender, positive bowel sounds, no masses palpated MSK no focal spinal tenderness, no upper extremity lymphedema Neuro: nonfocal, well  oriented, appropriate affect  Breasts: The right breast is benign. The left breast is status post lumpectomy followed by radiation. There is no evidence of local recurrence. Left axilla is benign.    LAB RESULTS: Lab Results  Component Value Date   WBC 7.5 12/08/2015   NEUTROABS 3.1 06/20/2015   HGB 13.8 12/08/2015   HCT 39.4 12/08/2015   MCV 88.3 12/08/2015   PLT 290 12/08/2015      Chemistry      Component Value Date/Time   NA 133 (L) 12/08/2015 0825   NA 136 08/02/2014 1258   K 4.0 12/08/2015 0825   K 4.4 08/02/2014 1258   CL 96 (L) 12/08/2015 0825   CL 95 (L) 07/28/2012 0941   CO2 28 12/08/2015 0825   CO2 29 08/02/2014 1258   BUN 15 12/08/2015 0825   BUN 18.1 08/02/2014 1258   CREATININE 0.63 12/08/2015 0825   CREATININE 0.7 08/02/2014 1258      Component Value Date/Time   CALCIUM 9.6 12/08/2015 0825   CALCIUM 9.5 08/02/2014 1258   ALKPHOS 80 12/08/2015 0825   ALKPHOS 84 08/02/2014 1258   AST 25 12/08/2015 0825   AST 21 08/02/2014 1258   ALT 22 12/08/2015 0825   ALT 22 08/02/2014 1258   BILITOT 1.2 12/08/2015 0825   BILITOT 1.07 08/02/2014 1258      STUDIES: US Abdomen Limited Ruq  Result Date: 12/08/2015 CLINICAL DATA:  Right upper quadrant pain since 12 a.m. EXAM: US ABDOMEN LIMITED - RIGHT UPPER QUADRANT COMPARISON:  None. FINDINGS: Gallbladder: No gallstones or wall thickening visualized. No sonographic Murphy sign noted by sonographer. [name] bile duct: Diameter: 2.9 mm Liver: No focal lesion identified. Within normal limits in parenchymal echogenicity. IMPRESSION: Normal right upper quadrant ultrasound. Electronically Signed   By: Kathreen Devoid   On: 12/08/2015 09:30     ASSESSMENT: 80 y.o. [place] woman   (1)  status post left lumpectomy and sentinel lymph node biopsy December of 2012 for a T2 N0, stage IIA invasive ductal carcinoma, grade 3, HER-2 not amplified, strongly estrogen and progesterone receptor positive with an elevated proliferation  marker at 72%;   (2) Oncotype recurrence score of 21, in the intermediate range, indicating a distant disease recurrence rate of 13% within 10 years if the patient's only systemic adjuvant therapy was tamoxifen for 5 years.   (2)  She completed radiation in March of 2013 and started letrozole at that time, discontinued February 2018  (a) Bone density at [facility] 12/02/2014 shows a T score of -1.8.  PLAN: [name] is now nearly 5 years out from definitive surgery for her breast cancer with no evidence of disease recurrence. This is very favorable.  She will continue letrozole through February and then will stop. We discussed the fact that the benefit of continuing an additional 5 years of letrozole, namely a 1% decrease in systemic recurrence, would be outweighed by her problems with osteopenia, which would be worsened.  When we reviewed her medication list today she did not recognize ibandronate/Boniva. She is not on it at this point. She will discuss with her primary care physician whether that or another bisphosphonate or denosumab/Prolia might be useful.  At this point she is comfortable "graduating" from follow-up here. All she needs in terms of breast cancer screening is yearly mammography, which usually obtains in February at [place], and a yearly physician breast exam.   I will be glad to see [name] at any point in the future if on when the need arises, but  as of now were making no further routine appointments for her here.  Chauncey Cruel, MD  07/27/2013

## 2015-12-27 ENCOUNTER — Encounter: Payer: Self-pay | Admitting: Physical Therapy

## 2015-12-27 ENCOUNTER — Ambulatory Visit: Payer: Medicare Other | Attending: Pulmonary Disease | Admitting: Physical Therapy

## 2015-12-27 DIAGNOSIS — R293 Abnormal posture: Secondary | ICD-10-CM | POA: Diagnosis not present

## 2015-12-27 DIAGNOSIS — M5382 Other specified dorsopathies, cervical region: Secondary | ICD-10-CM | POA: Insufficient documentation

## 2015-12-27 DIAGNOSIS — M62838 Other muscle spasm: Secondary | ICD-10-CM | POA: Diagnosis not present

## 2015-12-27 DIAGNOSIS — M542 Cervicalgia: Secondary | ICD-10-CM

## 2015-12-27 DIAGNOSIS — M545 Low back pain: Secondary | ICD-10-CM | POA: Diagnosis not present

## 2015-12-27 NOTE — Patient Instructions (Signed)
Shoulder (Scapula) Retraction    Pull shoulders back, squeezing shoulder blades together. Repeat _5 to 10 ___ times per session. Do _1 to 2 or more___ sessions per day.  This should feel good Position: Standing

## 2015-12-27 NOTE — Therapy (Signed)
[place], [place], [address] Phone: [phone]   Fax:  [phone]  Physical Therapy Evaluation  Patient Details  Name: [name] MRN: [id number] Date of Birth: 12-10-1935 Referring Provider: Ellouise Newer, MD  Encounter Date: 12/27/2015      PT End of Session - 12/27/15 1121    Visit Number 1   Number of Visits 12   Date for PT Re-Evaluation 02/26/15   Authorization Type KX modifier 15 visit   PT Start Time 1110   PT Stop Time 1150   PT Time Calculation (min) 40 min   Activity Tolerance Patient tolerated treatment well   Behavior During Therapy [facility] for tasks assessed/performed      Past Medical History:  Diagnosis Date  . Asthma   . Atrophic vaginitis   . Breast cancer, Left 12/20/2010   NO BLOOD PRESSURE CHECKS OR STICKS IN LEFT ARM  . CAD (coronary artery disease)    a. 01/19/2010 s/p CABG x 3, [place]->lad, vg->diag, vg->om1;  b. 06/2011 :Lexi MV: EF 84%, No ischemia. c. 01/06/14 s/p negative nuclear stress test with EF >70%  . Cervical arthritis ([place])   . Colonic polyp 04-27-2009   tubular adenoma  . Diverticulosis of colon (without mention of hemorrhage)   . Fibromyalgia   . Gallstones   . GERD (gastroesophageal reflux disease)   . Glaucoma   . Headache(784.0)    a. frequently assocaited with high BPs  . Hiatal hernia   . Hypercholesterolemia   . Irritable bowel syndrome   . Labile hypertension     Past Surgical History:  Procedure Laterality Date  . BREAST LUMPECTOMY Left 02/06/11  . CATARACT EXTRACTION, BILATERAL     bilateral caaract removal,  . CORONARY ANGIOPLASTY WITH STENT PLACEMENT     Stent 2007  . CORONARY ARTERY BYPASS GRAFT    . open heart surgery     01/19/2010    There were no vitals filed for this visit.       Subjective Assessment - 12/27/15 1124    Subjective Pt arriving to [facility] today complaining of chronic neck pain. Pt has PT in the past and  had good results from Jan 2017 to March 2017. Pt reporting pain of 7-8/10. Pain can increase intermittently.    Limitations House hold activities;Lifting   How long can you sit comfortably? 30 minutes   How long can you stand comfortably? unlimited   How long can you walk comfortably? unlimited   Diagnostic tests MRI in past   Patient Stated Goals Stop hurting, be able to perform my household chores.    Currently in Pain? Yes   Pain Score 8    Pain Location Neck   Pain Orientation Right;Left   Pain Descriptors / Indicators Aching;Tightness;Burning;Tender;Constant   Pain Type Chronic pain   Pain Onset More than a month ago   Pain Frequency Constant   Aggravating Factors  house hold chores   Pain Relieving Factors ice, heat   Effect of Pain on Daily Activities unable to perform household activities/cleaning her home   Multiple Pain Sites No            [facility] PT Assessment - 12/27/15 0001      Assessment   Medical Diagnosis neck pain   Referring Provider Ellouise Newer, MD   Hand Dominance Right   Next MD Visit 12/20/15   Prior Therapy yes, Jan 2017 to March 2017     Precautions  Precautions None     Restrictions   Weight Bearing Restrictions No     Balance Screen   Has the patient fallen in the past 6 months No   Is the patient reluctant to leave their home because of a fear of falling?  No     Prior Function   Level of Independence Independent   Vocation Retired   Leisure walk be outside, be with family     Cognition   Overall Cognitive Status Within Functional Limits for tasks assessed     ROM / Strength   AROM / PROM / Strength AROM;Strength     AROM   Overall AROM Comments reaching behind back: R UE: T12, L UE:    AROM Assessment Site Shoulder;Cervical   Right/Left Shoulder Right;Left   Left Shoulder Internal Rotation --  limited by pain   Cervical Flexion 20   Cervical Extension 16   Cervical - Right Side Bend 12   Cervical - Left Side Bend 20    Cervical - Right Rotation 40   Cervical - Left Rotation 40     Strength   Strength Assessment Site Shoulder   Right/Left Shoulder Right;Left   Right Shoulder Flexion 4-/5   Right Shoulder Extension 4-/5   Right Shoulder ABduction 3/5   Right Shoulder Internal Rotation 3/5   Right Shoulder External Rotation 3/5   Left Shoulder Flexion 4-/5   Left Shoulder Extension 4-/5   Left Shoulder ABduction 3/5   Left Shoulder Internal Rotation 3/5   Left Shoulder External Rotation 3/5     Special Tests    Special Tests Rotator Cuff Impingement   Rotator Cuff Impingment tests Empty Can test;Full Can test     Empty Can test   Findings Negative   Side Left   Comment negative right     Full Can test   Findings Negative   Side Left   Comment negative right                           PT Education - 12/27/15 1128    Education provided Yes   Education Details HEP, Postural awareness   Person(s) Educated Patient   Methods Explanation;Demonstration;Tactile cues;Verbal cues;Handout   Comprehension Returned demonstration;Verbalized understanding          PT Short Term Goals - 12/27/15 1319      PT SHORT TERM GOAL #1   Title pt will be I with initial HEP  (12/27/15)   Time 3   Period Weeks   Status New     PT SHORT TERM GOAL #2   Title pt will be able to verbalize and demonstrate techniques to reduce neck pain and headaches via postural awarenss, lifting and carrying mechanics and HEP (12/27/2015)   Time 3   Period Weeks   Status New           PT Long Term Goals - 12/27/15 1320      PT LONG TERM GOAL #1   Title Pt will improve her FOTO score from 50% limitaion to </= 40% limitation to improve functional mobility.    Baseline 50% limitation   Time 6   Period Weeks   Status New     PT LONG TERM GOAL #2   Title pt will demonstrate reduced muscle spasm to promote improvement of cervical moblity and reduce pain to </=4/10 ( 12/27/2015)    Baseline 8/10 pain  on 12/27/15   Time  6   Period Weeks     PT LONG TERM GOAL #3   Title pt will increase her cervical moblity by >/= 10 degrees to improve safety with driving and functional mobility. (01/07/16).   Time 6   Period Weeks   Status New               Plan - 01/07/2016 1301    Clinical Impression Statement Pt presenting to physical therapy as a low complexity evaluation complaining of cervical pain and upper trap soreness. Pt reporting pain of 8/10. Pt has had the same pain in the past and reports physical therapy helped in the past. Pt with limited ROM and mild weakness in bilateral shoulders. Skilled PT needed to address pt's increased pain and improve pt's functional mobility.    Rehab Potential Good   PT Frequency 2x / week   PT Duration 6 weeks   PT Treatment/Interventions ADLs/Self Care Home Management;Moist Heat;Traction;Functional mobility training;Passive range of motion;Taping;Dry needling;Therapeutic activities;Therapeutic exercise;Patient/family education;Ultrasound;Electrical Stimulation   PT Next Visit Plan Dry needling, cervical ROM, Stretching, Shoulder strengthening   PT Home Exercise Plan upper trap stretch, shoulder retraction, cervical flexion/rotation   Consulted and Agree with Plan of Care Patient      Patient will benefit from skilled therapeutic intervention in order to improve the following deficits and impairments:  Decreased activity tolerance, Impaired perceived functional ability, Postural dysfunction, Improper body mechanics, Decreased strength, Decreased mobility, Impaired UE functional use, Pain  Visit Diagnosis: Neck pain  Muscle spasms of neck  Decreased ROM of intervertebral discs of cervical spine  Abnormal posture      G-Codes - Jan 07, 2016 1335    Functional Assessment Tool Used FOTO   Functional Limitation Carrying, moving and handling objects   Carrying, Moving and Handling Objects Current Status [id number]) At least 40 percent but less than 60  percent impaired, limited or restricted   Carrying, Moving and Handling Objects Goal Status [id number]) At least 40 percent but less than 60 percent impaired, limited or restricted       Problem List Patient Active Problem List   Diagnosis Date Noted  . Bloating 05/23/2015  . Diarrhea 05/23/2015  . Heartburn 05/23/2015  . Bilateral occipital neuralgia 11/23/2014  . Cervicogenic headache 11/23/2014  . Neck pain 11/23/2014  . OSA on CPAP 11/23/2014  . Generalized anxiety disorder 02/04/2014  . Labile hypertension   . Fibromyalgia   . Cervical spondylolysis 12/24/2013  . CAD (coronary artery disease) 12/24/2013  . Hypertensive emergency 12/23/2013  . Acute encephalopathy 12/23/2013  . Bronchitis, chronic obstructive w acute bronchitis ([place]) 12/21/2013  . Diverticulosis of colon without hemorrhage 11/15/2013  . Hot flashes related to aromatase inhibitor therapy 07/27/2013  . Osteopenia 07/27/2013  . DJD (degenerative joint disease) 09/29/2012  . Breast cancer, Left 12/20/2010  . Leg pain 11/28/2010  . FATTY LIVER DISEASE 01/09/2010  . GASTRITIS 12/16/2007  . Allergic rhinitis 02/18/2007  . DIZZINESS, CHRONIC 02/18/2007  . Headache in back of head 02/18/2007  . Mixed hyperlipidemia 12/23/2006  . GLAUCOMA 12/23/2006  . Coronary atherosclerosis 12/23/2006  . Asthma 12/23/2006  . GERD 12/23/2006  . IRRITABLE BOWEL SYNDROME 12/23/2006  . Myalgia and myositis 12/23/2006    Oretha Caprice, MPT  01-07-16, 1:37 PM  [facility] [address] [place], [place], [address] Phone: [phone]   Fax:  [phone]  Name: [name] MRN: [id number] Date of Birth: [birth year]

## 2015-12-28 ENCOUNTER — Encounter: Payer: Self-pay | Admitting: Oncology

## 2015-12-28 DIAGNOSIS — R921 Mammographic calcification found on diagnostic imaging of breast: Secondary | ICD-10-CM | POA: Diagnosis not present

## 2016-01-01 ENCOUNTER — Telehealth: Payer: Self-pay | Admitting: Pulmonary Disease

## 2016-01-01 NOTE — Telephone Encounter (Signed)
Called and spoke to pt. Pt states she noticed Boniva 150 mg was on her medication list but has not been taking this. This was taken off her medication list on 10.30.17 by Dr. Jana Hakim. Pt states she will "stay off" of this until she comes back to see Dr. Lenna Gilford.   Will send to SN as FYI.

## 2016-01-02 ENCOUNTER — Ambulatory Visit: Payer: Medicare Other | Admitting: Physical Therapy

## 2016-01-02 DIAGNOSIS — M542 Cervicalgia: Secondary | ICD-10-CM | POA: Diagnosis not present

## 2016-01-02 DIAGNOSIS — M545 Low back pain: Secondary | ICD-10-CM | POA: Diagnosis not present

## 2016-01-02 DIAGNOSIS — R293 Abnormal posture: Secondary | ICD-10-CM

## 2016-01-02 DIAGNOSIS — M62838 Other muscle spasm: Secondary | ICD-10-CM

## 2016-01-02 DIAGNOSIS — M5382 Other specified dorsopathies, cervical region: Secondary | ICD-10-CM | POA: Diagnosis not present

## 2016-01-02 DIAGNOSIS — J301 Allergic rhinitis due to pollen: Secondary | ICD-10-CM | POA: Diagnosis not present

## 2016-01-02 DIAGNOSIS — J3089 Other allergic rhinitis: Secondary | ICD-10-CM | POA: Diagnosis not present

## 2016-01-02 NOTE — Therapy (Signed)
[place], [place], [address] Phone: [phone]   Fax:  [phone]  Physical Therapy Treatment  Patient Details  Name: [name] MRN: [id number] Date of Birth: [birth year] Referring Provider: Ellouise Newer, MD  Encounter Date: 01/02/2016      PT End of Session - 01/02/16 1015    Visit Number 2   Number of Visits 12   Date for PT Re-Evaluation 02/26/15   Authorization Type KX modifier 15 visit   PT Start Time 1015   PT Stop Time 1055   PT Time Calculation (min) 40 min   Activity Tolerance Patient tolerated treatment well   Behavior During Therapy [facility] for tasks assessed/performed      Past Medical History:  Diagnosis Date  . Asthma   . Atrophic vaginitis   . Breast cancer, Left 12/20/2010   NO BLOOD PRESSURE CHECKS OR STICKS IN LEFT ARM  . CAD (coronary artery disease)    a. 01/19/2010 s/p CABG x 3, lima->lad, vg->diag, vg->om1;  b. 06/2011 :Lexi MV: EF 84%, No ischemia. c. 01/06/14 s/p negative nuclear stress test with EF >70%  . Cervical arthritis ([place])   . Colonic polyp 04-27-2009   tubular adenoma  . Diverticulosis of colon (without mention of hemorrhage)   . Fibromyalgia   . Gallstones   . GERD (gastroesophageal reflux disease)   . Glaucoma   . Headache(784.0)    a. frequently assocaited with high BPs  . Hiatal hernia   . Hypercholesterolemia   . Irritable bowel syndrome   . Labile hypertension     Past Surgical History:  Procedure Laterality Date  . BREAST LUMPECTOMY Left 02/06/11  . CATARACT EXTRACTION, BILATERAL     bilateral caaract removal,  . CORONARY ANGIOPLASTY WITH STENT PLACEMENT     Stent 2007  . CORONARY ARTERY BYPASS GRAFT    . open heart surgery     01/19/2010    There were no vitals filed for this visit.      Subjective Assessment - 01/02/16 1040    Subjective pt arriving today reporting 5/10 pain in her neck and upper traps. Pt reported doing her HEP.     Limitations House hold activities;Lifting   How long can you sit comfortably? 30 minutes   How long can you stand comfortably? unlimited   How long can you walk comfortably? unlimited   Diagnostic tests MRI in past   Patient Stated Goals Stop hurting, be able to perform my household chores.    Currently in Pain? Yes   Pain Score 5    Pain Location Neck   Pain Orientation Right;Left   Pain Descriptors / Indicators Aching   Pain Type Chronic pain   Pain Onset More than a month ago   Pain Frequency Constant   Aggravating Factors  household chores   Pain Relieving Factors heat   Effect of Pain on Daily Activities unable to perform  household activities/ cleaning her home   Multiple Pain Sites No                         [facility] Adult PT Treatment/Exercise - 01/02/16 0001      Posture/Postural Control   Posture/Postural Control Postural limitations   Postural Limitations Rounded Shoulders;Forward head     Exercises   Exercises Neck     Neck Exercises: Machines for Strengthening   UBE (Upper Arm Bike) L1 2 minutes forward and 2 minutes backward  Neck Exercises: Theraband   Rows 10 reps;Red     Neck Exercises: Seated   Upper Extremity D1 10 reps  instructions to follow arm  with head rotation   Postural Training Edu on cervical retraction and scapular retraction.      Neck Exercises: Supine   Cervical Isometrics Flexion;Right lateral flexion;Left lateral flexion;Right rotation;Left rotation;3 secs   Cervical Isometrics Limitations needed verbal instructions for technique    Lateral Flexion Right;Left     Manual Therapy   Manual Therapy Soft tissue mobilization   Manual therapy comments upper trap and medial scapular border                PT Education - 01/02/16 1058    Education provided Yes   Education Details reviewed HEP, added cervical retraction in supine   Person(s) Educated Patient   Methods Explanation;Demonstration;Tactile  cues;Verbal cues;Handout   Comprehension Verbalized understanding;Returned demonstration;Verbal cues required          PT Short Term Goals - 01/02/16 1016      PT SHORT TERM GOAL #1   Title pt will be I with initial HEP  (12/27/15)   Time 3   Period Weeks   Status On-going     PT SHORT TERM GOAL #2   Title pt will be able to verbalize and demonstrate techniques to reduce neck pain and headaches via postural awarenss, lifting and carrying mechanics and HEP (12/27/2015)   Period Weeks   Status New           PT Long Term Goals - 12/27/15 1320      PT LONG TERM GOAL #1   Title Pt will improve her FOTO score from 50% limitaion to </= 40% limitation to improve functional mobility.    Baseline 50% limitation   Time 6   Period Weeks   Status New     PT LONG TERM GOAL #2   Title pt will demonstrate reduced muscle spasm to promote improvement of cervical moblity and reduce pain to </=4/10 ( 12/27/2015)    Baseline 8/10 pain on 12/27/15   Time 6   Period Weeks     PT LONG TERM GOAL #3   Title pt will increase her cervical moblity by >/= 10 degrees to improve safety with driving and functional mobility. (12/27/15).   Time 6   Period Weeks   Status New               Plan - 01/02/16 1031    Clinical Impression Statement Pt presenting today complaining of 5/10 pain in bilateral cervical spine and bilateral upper trap. Pt reviewed her HEP and tolerated well. Pt reporting decreased pain at end of session following moist heat application.    Rehab Potential Good   PT Frequency 2x / week   PT Duration 6 weeks   PT Treatment/Interventions ADLs/Self Care Home Management;Moist Heat;Traction;Functional mobility training;Passive range of motion;Taping;Dry needling;Therapeutic activities;Therapeutic exercise;Patient/family education;Ultrasound;Electrical Stimulation   PT Home Exercise Plan upper trap stretch, shoulder retraction, cervical flexion/rotation   Consulted and Agree with  Plan of Care Patient      Patient will benefit from skilled therapeutic intervention in order to improve the following deficits and impairments:  Decreased activity tolerance, Impaired perceived functional ability, Postural dysfunction, Improper body mechanics, Decreased strength, Decreased mobility, Impaired UE functional use, Pain  Visit Diagnosis: Neck pain  Muscle spasms of neck  Decreased ROM of intervertebral discs of cervical spine  Abnormal posture     Problem List  Patient Active Problem List   Diagnosis Date Noted  . Bloating 05/23/2015  . Diarrhea 05/23/2015  . Heartburn 05/23/2015  . Bilateral occipital neuralgia 11/23/2014  . Cervicogenic headache 11/23/2014  . Neck pain 11/23/2014  . OSA on CPAP 11/23/2014  . Generalized anxiety disorder 02/04/2014  . Labile hypertension   . Fibromyalgia   . Cervical spondylolysis 12/24/2013  . CAD (coronary artery disease) 12/24/2013  . Hypertensive emergency 12/23/2013  . Acute encephalopathy 12/23/2013  . Bronchitis, chronic obstructive w acute bronchitis ([place]) 12/21/2013  . Diverticulosis of colon without hemorrhage 11/15/2013  . Hot flashes related to aromatase inhibitor therapy 07/27/2013  . Osteopenia 07/27/2013  . DJD (degenerative joint disease) 09/29/2012  . Breast cancer, Left 12/20/2010  . Leg pain 11/28/2010  . FATTY LIVER DISEASE 01/09/2010  . GASTRITIS 12/16/2007  . Allergic rhinitis 02/18/2007  . DIZZINESS, CHRONIC 02/18/2007  . Headache in back of head 02/18/2007  . Mixed hyperlipidemia 12/23/2006  . GLAUCOMA 12/23/2006  . Coronary atherosclerosis 12/23/2006  . Asthma 12/23/2006  . GERD 12/23/2006  . IRRITABLE BOWEL SYNDROME 12/23/2006  . Myalgia and myositis 12/23/2006    Oretha Caprice, MPT 01/02/2016, 11:03 AM  [facility] [address] [place], [place], [address] Phone: [phone]   Fax:  [phone]  Name: [name] MRN:  [id number] Date of Birth: 29-Jun-1935

## 2016-01-03 ENCOUNTER — Encounter: Payer: Self-pay | Admitting: Gastroenterology

## 2016-01-03 ENCOUNTER — Ambulatory Visit (INDEPENDENT_AMBULATORY_CARE_PROVIDER_SITE_OTHER): Payer: Medicare Other | Admitting: Gastroenterology

## 2016-01-03 VITALS — BP 132/60 | HR 64 | Ht <= 58 in | Wt 137.0 lb

## 2016-01-03 DIAGNOSIS — R142 Eructation: Secondary | ICD-10-CM

## 2016-01-03 DIAGNOSIS — K219 Gastro-esophageal reflux disease without esophagitis: Secondary | ICD-10-CM

## 2016-01-03 DIAGNOSIS — I2583 Coronary atherosclerosis due to lipid rich plaque: Secondary | ICD-10-CM

## 2016-01-03 DIAGNOSIS — I2510 Atherosclerotic heart disease of native coronary artery without angina pectoris: Secondary | ICD-10-CM | POA: Diagnosis not present

## 2016-01-03 MED ORDER — PANTOPRAZOLE SODIUM 40 MG PO TBEC: 40.0000 mg | DELAYED_RELEASE_TABLET | Freq: Every day | ORAL | 6 refills | Status: DC

## 2016-01-03 MED ORDER — HYOSCYAMINE SULFATE 0.125 MG PO TBDP: 0.1250 mg | ORAL_TABLET | Freq: Four times a day (QID) | ORAL | 3 refills | Status: DC | PRN

## 2016-01-03 NOTE — Progress Notes (Signed)
     01/03/2016 [name] [id number] 28-Mar-1935   History of Present Illness:  This is 80 year old female who is known to Dr. Fuller Plan. She had colonoscopy in May 2016 at which time she was found have moderate diverticulosis in the sigmoid colon and 2 polyps that were removed and one was a tubular adenoma and the other was a hyperplastic polyp. She also had EGD performed at the same time, which was normal.  She has multiple medication allergies/intolerances.   She presents to the office today for ER follow-up of upper abdominal pain that she says has now resolved.  Apparently had been experiencing upper abdominal pain across her entire upper abdomen. She went to the ER and abdominal sounds unremarkable. CBC, CMP, lipase were also unremarkable. Has a history of suspected IBS/functional abdominal pain and diarrhea. Has Levsin at home that she uses when necessary. She's had extensive GI evaluation over the years.   She does complain of ongoing heartburn and acid reflux despite pantoprazole 40 mg daily and Zantac 300 mg daily (PPI was changed from omeprazole to pantoprazole at last visit in March).      Current Medications, Allergies, Past Medical History, Past Surgical History, Family History and Social History were reviewed in [employer] record.   Physical Exam: BP 132/60   Pulse 64   Ht 4\' 10"  (1.473 m)   Wt 137 lb (62.1 kg)   LMP 11/26/1990   BMI 28.63 kg/m  General: Well developed white female in no acute distress Head: Normocephalic and atraumatic Eyes:  Sclerae anicteric, conjunctiva pink  Ears: Normal auditory acuity Lungs: Clear throughout to auscultation Heart: Regular rate and rhythm Abdomen: Soft, non-distended.  Normal bowel sounds.  Non-tender. Musculoskeletal: Symmetrical with no gross deformities  Extremities: No edema  Neurological: Alert oriented x 4, grossly non-focal Psychological:  Alert and cooperative. Normal mood and  affect  Assessment and Recommendations: -Upper abdominal pain:  Resolved.  Has had extensive GI evaluation in the near past.  Most recently ultrasound was negative. -IBS:  Will refill Levsin. -GERD/belching:  Still with symptoms despite changing PPI to pantoprazole 40 mg daily and taking zantac once daily as well.  Will refill pantoprazole for now.  Will schedule 24 hour pH study while patient is on medication.

## 2016-01-03 NOTE — Progress Notes (Signed)
Reviewed and agree with initial management plan.  Darian Ace T. Jettie Lazare, MD FACG 

## 2016-01-03 NOTE — Patient Instructions (Signed)
We have sent the following medications to your pharmacy for you to pick up at your convenience:  Pantoprazole and Levsin    You have been scheduled for a 24 Hour PH Probe test at [facility] on 01/08/2016 at 10:00am. Please arrive 30 minutes prior to your procedure for registration. You will need to go to outpatient registration (1st floor of the hospital) first. Make certain to bring your insurance cards as well as a complete list of medications.  Please remember the following:  1) Do not take any muscle relaxants, xanax (alprazolam) or ativan for 1 day prior to your     test as well as the day of the test.  2) Nothing to eat or drink after 12:00 midnight on the night before your test.  3) Hold all diabetic medications/insulin the morning of the test. You may eat and take             your medications after the test.  4) For 7 days prior to your test do not take: Dexilant, Prevacid, Nexium, Protonix,         Aciphex, Zegerid, Pantoprazole, Prilosec or omeprazole.  5) For 7 days prior to your test, do not take: Reglan, Tagamet, Zantac, Axid or Pepcid.  6) You MAY use an antacid such as Rolaids or Tums up to 12 hours prior to your test.  It will take at least 2 weeks to receive the results of this test from your physician. ------------------------------------------ ABOUT 24 HOUR [place] PROBE An esophageal pH test measures and records the pH in your esophagus to determine if you have gastroesophageal reflux disease (GERD). The test can also be done to determine the effectiveness of medications or surgical treatment for GERD. What is esophageal reflux? Esophageal reflux is a condition in which stomach acid refluxes or moves back into the esophagus (the "food pipe" leading from the mouth to the stomach). How does the esophageal pH test work? A thin, small tube with an acid sensing device on the tip is gently passed through your nose, down the esophagus ("food tube"), and positioned about  2 inches above the lower esophageal sphincter. The tube is secured to the side of your face with clear tape. The end of the tube exiting from your nose is attached to a portable recorder that is worn on your belt or over your shoulder. The recorder has several buttons on it that you will press to mark certain events. A nurse will review the monitoring instructions with you. Once the test has begun, what do I need to know and do? Activity: Follow your usual daily routine. Do not reduce or change your activities during the monitoring period. Doing so can make the monitoring results less useful.  Note: do not take a tub bath or shower; the equipment can't get wet.  Eating: Eat your regular meals at the usual times. If you do not eat during the monitoring period, your stomach will not produce acid as usual, and the test results will not be accurate. Eat at least 2 meals a day. Eat foods that tend to increase your symptoms (without making yourself miserable). Avoid snacking. Do not suck on hard candy or lozenges and do not chew gum during the monitoring period.  Lying down: Remain upright throughout the day. Do not lie down until you go to bed (unless napping or lying down during the day is part of your daily routine).  Medications: Continue to follow your doctor's advice regarding medications to avoid  during the monitoring period.  Recording symptoms: Press the appropriate button on your recorder when symptoms occur (as discussed with the nurse).  Recording events: Record the time you start and stop eating and drinking (anything other than plain water). Record the time you lie down (even if just resting) and when you get back up. The nurse will explain this.  Unusual symptoms or side effects. If you think you may be experiencing any unusual symptoms or side effects, call your doctor.  You will return the next day to have the tube removed. The information on the recorder will be downloaded to a computer and the  results will be analyzed.  After completion of the study Resume your normal diet and medications. Lozenges or hard candy may help ease any sore throat caused by the tube.

## 2016-01-04 ENCOUNTER — Ambulatory Visit: Payer: Medicare Other | Admitting: Physical Therapy

## 2016-01-04 DIAGNOSIS — R293 Abnormal posture: Secondary | ICD-10-CM

## 2016-01-04 DIAGNOSIS — M62838 Other muscle spasm: Secondary | ICD-10-CM

## 2016-01-04 DIAGNOSIS — M5382 Other specified dorsopathies, cervical region: Secondary | ICD-10-CM

## 2016-01-04 DIAGNOSIS — M542 Cervicalgia: Secondary | ICD-10-CM

## 2016-01-04 DIAGNOSIS — M545 Low back pain: Secondary | ICD-10-CM | POA: Diagnosis not present

## 2016-01-04 NOTE — Therapy (Signed)
[place], [place], [address] Phone: [phone]   Fax:  [phone]  Physical Therapy Treatment  Patient Details  Name: [name] MRN: [id number] Date of Birth: [birth year] Referring Provider: Ellouise Newer, MD  Encounter Date: 01/04/2016      PT End of Session - 01/04/16 1023    Visit Number 3   Number of Visits 12   Date for PT Re-Evaluation 02/26/15   Authorization Type KX modifier 15 visit   PT Start Time 1015   PT Stop Time 1055   PT Time Calculation (min) 40 min   Activity Tolerance Patient tolerated treatment well   Behavior During Therapy [facility] for tasks assessed/performed      Past Medical History:  Diagnosis Date  . Asthma   . Atrophic vaginitis   . Breast cancer, Left 12/20/2010   NO BLOOD PRESSURE CHECKS OR STICKS IN LEFT ARM  . CAD (coronary artery disease)    a. 01/19/2010 s/p CABG x 3, lima->lad, vg->diag, vg->om1;  b. 06/2011 :Lexi MV: EF 84%, No ischemia. c. 01/06/14 s/p negative nuclear stress test with EF >70%  . Cervical arthritis ([place])   . Colonic polyp 04-27-2009   tubular adenoma  . Diverticulosis of colon (without mention of hemorrhage)   . Fibromyalgia   . Gallstones   . GERD (gastroesophageal reflux disease)   . Glaucoma   . Headache(784.0)    a. frequently assocaited with high BPs  . Hiatal hernia   . Hypercholesterolemia   . Irritable bowel syndrome   . Labile hypertension     Past Surgical History:  Procedure Laterality Date  . BREAST LUMPECTOMY Left 02/06/11  . CATARACT EXTRACTION, BILATERAL     bilateral caaract removal,  . CORONARY ANGIOPLASTY WITH STENT PLACEMENT     Stent 2007  . CORONARY ARTERY BYPASS GRAFT    . open heart surgery     01/19/2010    There were no vitals filed for this visit.      Subjective Assessment - 01/04/16 1020    Subjective Pt arriving to therapy reporting 4/10 pain in he neck and upper trap. pt reported doing her  exercises and stretches at home and reported she would like to review her exercises.    Limitations House hold activities;Lifting   How long can you sit comfortably? 30 minutes   How long can you stand comfortably? unlimited   Diagnostic tests MRI in past   Currently in Pain? Yes   Pain Location Neck   Pain Orientation Right;Left   Pain Descriptors / Indicators Aching   Pain Type Chronic pain   Pain Onset More than a month ago   Pain Frequency Constant   Aggravating Factors  house hold chores   Pain Relieving Factors heat   Effect of Pain on Daily Activities unable to perform household activities/ cleaning/ cooking can all be difficult   Multiple Pain Sites No            [facility] PT Assessment - 01/04/16 0001      AROM   Cervical - Right Rotation 50   Cervical - Left Rotation 40                     [facility] Adult PT Treatment/Exercise - 01/04/16 0001      Posture/Postural Control   Posture/Postural Control Postural limitations   Postural Limitations Rounded Shoulders;Forward head   Posture Comments instructed to perform posture correction throughout the day.  Exercises   Exercises Neck     Neck Exercises: Machines for Strengthening   UBE (Upper Arm Bike) L1.5 3 minutes forward/ 3 minutes backward     Neck Exercises: Theraband   Rows 10 reps;Red  2 sets     Neck Exercises: Seated   Upper Extremity D1 10 reps     Neck Exercises: Supine   Cervical Isometrics Flexion;Extension;Right rotation;Left rotation   Cervical Isometrics Limitations pt needed verbal instructions for postural correction/technique   Lateral Flexion Right;Left;5 reps     Manual Therapy   Manual Therapy Soft tissue mobilization;Scapular mobilization   Manual therapy comments upper trap, medial scapular border   Soft tissue mobilization trigger point release on R and left upper trap   Scapular Mobilization glides on the right                PT Education - 01/04/16 1022     Education Details reviewed HEP, posture correction   Person(s) Educated Patient   Methods Explanation;Demonstration;Tactile cues;Verbal cues   Comprehension Verbalized understanding;Returned demonstration          PT Short Term Goals - 01/04/16 1031      PT SHORT TERM GOAL #1   Title pt will be I with initial HEP  (12/27/15)   Time 3   Period Weeks   Status On-going     PT SHORT TERM GOAL #2   Title pt will be able to verbalize and demonstrate techniques to reduce neck pain and headaches via postural awarenss, lifting and carrying mechanics and HEP (12/27/2015)   Period Weeks   Status New           PT Long Term Goals - 12/27/15 1320      PT LONG TERM GOAL #1   Title Pt will improve her FOTO score from 50% limitaion to </= 40% limitation to improve functional mobility.    Baseline 50% limitation   Time 6   Period Weeks   Status New     PT LONG TERM GOAL #2   Title pt will demonstrate reduced muscle spasm to promote improvement of cervical moblity and reduce pain to </=4/10 ( 12/27/2015)    Baseline 8/10 pain on 12/27/15   Time 6   Period Weeks     PT LONG TERM GOAL #3   Title pt will increase her cervical moblity by >/= 10 degrees to improve safety with driving and functional mobility. (12/27/15).   Time 6   Period Weeks   Status New               Plan - 01/04/16 1023    Clinical Impression Statement pt presenting with 4/10 pain in her neck on bilateral sides with the right side worse than left. Pt reviewed exercises added at last visit. pt tolerated treatment exercises well, soft tissue mobilization to upper traps and medial scapular border bilaterally. pt reporting decreased pain at end of session. Continue with skilled physical therapy to progress toward goals set.    Rehab Potential Good   PT Frequency 2x / week   PT Duration 6 weeks   PT Next Visit Plan Dry needling, cervical ROM, Stretching, Shoulder strengthening   PT Home Exercise Plan upper trap  stretch, shoulder retraction, cervical flexion/rotation   Consulted and Agree with Plan of Care Patient      Patient will benefit from skilled therapeutic intervention in order to improve the following deficits and impairments:  Decreased activity tolerance, Impaired perceived functional ability, Postural  dysfunction, Improper body mechanics, Decreased strength, Decreased mobility, Impaired UE functional use, Pain  Visit Diagnosis: Neck pain  Muscle spasms of neck  Decreased ROM of intervertebral discs of cervical spine  Abnormal posture     Problem List Patient Active Problem List   Diagnosis Date Noted  . Belching 01/03/2016  . Bloating 05/23/2015  . Diarrhea 05/23/2015  . Heartburn 05/23/2015  . Bilateral occipital neuralgia 11/23/2014  . Cervicogenic headache 11/23/2014  . Neck pain 11/23/2014  . OSA on CPAP 11/23/2014  . Generalized anxiety disorder 02/04/2014  . Labile hypertension   . Fibromyalgia   . Cervical spondylolysis 12/24/2013  . CAD (coronary artery disease) 12/24/2013  . Hypertensive emergency 12/23/2013  . Acute encephalopathy 12/23/2013  . Bronchitis, chronic obstructive w acute bronchitis ([place]) 12/21/2013  . Diverticulosis of colon without hemorrhage 11/15/2013  . Hot flashes related to aromatase inhibitor therapy 07/27/2013  . Osteopenia 07/27/2013  . DJD (degenerative joint disease) 09/29/2012  . Breast cancer, Left 12/20/2010  . Leg pain 11/28/2010  . FATTY LIVER DISEASE 01/09/2010  . GASTRITIS 12/16/2007  . Allergic rhinitis 02/18/2007  . DIZZINESS, CHRONIC 02/18/2007  . Headache in back of head 02/18/2007  . Mixed hyperlipidemia 12/23/2006  . GLAUCOMA 12/23/2006  . Coronary atherosclerosis 12/23/2006  . Asthma 12/23/2006  . GERD 12/23/2006  . IRRITABLE BOWEL SYNDROME 12/23/2006  . Myalgia and myositis 12/23/2006    Oretha Caprice, MPT 01/04/2016, 10:59 AM  [facility] [address] [place], [place], [address] Phone: [phone]   Fax:  [phone]  Name: SANVI OGUINN MRN: [id number] Date of Birth: Jun 23, 1935

## 2016-01-08 ENCOUNTER — Ambulatory Visit (HOSPITAL_COMMUNITY)
Admission: RE | Admit: 2016-01-08 | Discharge: 2016-01-08 | Disposition: A | Discharge status: Home / Self Care | Payer: Medicare Other | Source: Ambulatory Visit | Attending: Gastroenterology | Admitting: Gastroenterology

## 2016-01-08 ENCOUNTER — Encounter (HOSPITAL_COMMUNITY)
Admission: RE | Disposition: A | Discharge status: Home / Self Care | Payer: Self-pay | Source: Ambulatory Visit | Attending: Gastroenterology

## 2016-01-08 DIAGNOSIS — J3089 Other allergic rhinitis: Secondary | ICD-10-CM | POA: Diagnosis not present

## 2016-01-08 DIAGNOSIS — K224 Dyskinesia of esophagus: Secondary | ICD-10-CM | POA: Diagnosis not present

## 2016-01-08 DIAGNOSIS — K219 Gastro-esophageal reflux disease without esophagitis: Secondary | ICD-10-CM | POA: Insufficient documentation

## 2016-01-08 DIAGNOSIS — R12 Heartburn: Secondary | ICD-10-CM

## 2016-01-08 DIAGNOSIS — R142 Eructation: Secondary | ICD-10-CM | POA: Diagnosis not present

## 2016-01-08 DIAGNOSIS — J301 Allergic rhinitis due to pollen: Secondary | ICD-10-CM | POA: Diagnosis not present

## 2016-01-08 HISTORY — PX: ESOPHAGEAL MANOMETRY: SHX5429

## 2016-01-08 HISTORY — PX: 24 HOUR PH STUDY: SHX5419

## 2016-01-08 SURGERY — MANOMETRY, ESOPHAGUS

## 2016-01-08 MED ORDER — LIDOCAINE VISCOUS 2 % MT SOLN
OROMUCOSAL | Status: AC
  Filled 2016-01-08: qty 15

## 2016-01-08 SURGICAL SUPPLY — 2 items
FACESHIELD LNG OPTICON STERILE (SAFETY) IMPLANT
GLOVE BIO SURGEON STRL SZ8 (GLOVE) ×6 IMPLANT

## 2016-01-08 NOTE — Progress Notes (Signed)
Esophageal Manometry done per protocol. Pt tolerated well without distress.  24 hour impedance probe placed without difficulty at 34 cm which is 5 cm above top border of LES. PH with impedance monitor and study education done with patient and husband using teachback. Pt and husband verbalized understanding with questions answered. Will return 01/09/2016 at 11:45 or after to have probe removed and monitor downloaded. Will send report to Dr. Lynne Leader office at that time.

## 2016-01-09 ENCOUNTER — Ambulatory Visit: Payer: Medicare Other | Admitting: Physical Therapy

## 2016-01-09 ENCOUNTER — Encounter (HOSPITAL_COMMUNITY): Payer: Self-pay | Admitting: Gastroenterology

## 2016-01-11 ENCOUNTER — Ambulatory Visit: Payer: Medicare Other | Admitting: Physical Therapy

## 2016-01-11 DIAGNOSIS — M545 Low back pain: Secondary | ICD-10-CM | POA: Diagnosis not present

## 2016-01-11 DIAGNOSIS — M5382 Other specified dorsopathies, cervical region: Secondary | ICD-10-CM

## 2016-01-11 DIAGNOSIS — R293 Abnormal posture: Secondary | ICD-10-CM | POA: Diagnosis not present

## 2016-01-11 DIAGNOSIS — M62838 Other muscle spasm: Secondary | ICD-10-CM | POA: Diagnosis not present

## 2016-01-11 DIAGNOSIS — M542 Cervicalgia: Secondary | ICD-10-CM

## 2016-01-11 NOTE — Patient Instructions (Signed)
Decompression exercises from ex drawer5 x 5 seconds each head press and shoulder press.  Decompression 5 - 15 minutes as needed during the day.

## 2016-01-11 NOTE — Therapy (Signed)
[place], [place], [address] Phone: [phone]   Fax:  [phone]  Physical Therapy Treatment  Patient Details  Name: [name] MRN: [id number] Date of Birth: May 17, 1935 Referring Provider: Ellouise Newer, MD  Encounter Date: 01/11/2016      PT End of Session - 01/11/16 1105    Visit Number 4   Number of Visits 12   PT Start Time 1015   PT Stop Time 1115   PT Time Calculation (min) 60 min   Activity Tolerance Patient tolerated treatment well   Behavior During Therapy [facility] for tasks assessed/performed      Past Medical History:  Diagnosis Date  . Asthma   . Atrophic vaginitis   . Breast cancer, Left 12/20/2010   NO BLOOD PRESSURE CHECKS OR STICKS IN LEFT ARM  . CAD (coronary artery disease)    a. 01/19/2010 s/p CABG x 3, lima->lad, vg->diag, vg->om1;  b. 06/2011 :Lexi MV: EF 84%, No ischemia. c. 01/06/14 s/p negative nuclear stress test with EF >70%  . Cervical arthritis ([place])   . Colonic polyp 04-27-2009   tubular adenoma  . Diverticulosis of colon (without mention of hemorrhage)   . Fibromyalgia   . Gallstones   . GERD (gastroesophageal reflux disease)   . Glaucoma   . Headache(784.0)    a. frequently assocaited with high BPs  . Hiatal hernia   . Hypercholesterolemia   . Irritable bowel syndrome   . Labile hypertension     Past Surgical History:  Procedure Laterality Date  . [place] STUDY N/A 01/08/2016   Procedure: [place] STUDY;  Surgeon: Ladene Artist, MD;  Location: [facility];  Service: Endoscopy;  Laterality: N/A;  . BREAST LUMPECTOMY Left 02/06/11  . CATARACT EXTRACTION, BILATERAL     bilateral caaract removal,  . CORONARY ANGIOPLASTY WITH STENT PLACEMENT     Stent 2007  . CORONARY ARTERY BYPASS GRAFT    . ESOPHAGEAL MANOMETRY N/A 01/08/2016   Procedure: ESOPHAGEAL MANOMETRY (EM);  Surgeon: Ladene Artist, MD;  [facility];  Service: Endoscopy;   Laterality: N/A;  . open heart surgery     01/19/2010    There were no vitals filed for this visit.      Subjective Assessment - 01/11/16 1018    Subjective Had a test Tuesday and I had to sit on th edge of the mat and neck is tight and sore(1 hour to -45)  I have 6/10 pain.  PT 1 X this week.  I don't have the results of the test.   Currently in Pain? Yes   Pain Score 6   pain up to 8-9/10   Pain Location Neck   Pain Descriptors / Indicators Headache;Aching;Tightness   Pain Radiating Towards top of head   Pain Frequency Constant   Aggravating Factors  sitting unsupported for a test   Pain Relieving Factors tylenol, heat   Effect of Pain on Daily Activities ADL difficult                         [facility] Adult PT Treatment/Exercise - 01/11/16 0001      Neck Exercises: Supine   Other Supine Exercise Decompression, shoulder press, head press 5 X each with decompression 15 + minutes with manual  HEP,  towel roll upper back during 1st decompression exercis     Moist Heat Therapy   Number Minutes Moist Heat 15 Minutes  Moist Heat Location Cervical  upper back with extra layers     Manual Therapy   Manual Therapy Soft tissue mobilization;Myofascial release   Manual therapy comments trigger point releases, neuromuscular with muscular twitch and lengthening response.  Multiple points softened,  then in supine over rolled towel, myofascial releases to [place] and anterior shpoulders.                  PT Education - 01/11/16 1104    Education provided Yes   Education Details exercise   Person(s) Educated Patient   Methods Explanation;Demonstration;Tactile cues;Verbal cues;Handout   Comprehension Verbalized understanding;Returned demonstration;Need further instruction          PT Short Term Goals - 01/11/16 1509      PT SHORT TERM GOAL #1   Title pt will be I with initial HEP  (12/27/15)   Time 3   Period Weeks   Status Unable to assess     PT SHORT  TERM GOAL #2   Title pt will be able to verbalize and demonstrate techniques to reduce neck pain and headaches via postural awarenss, lifting and carrying mechanics and HEP (12/27/2015)   Baseline discussed today. practiced decompression   Time 3   Period Weeks   Status On-going           PT Long Term Goals - 12/27/15 1320      PT LONG TERM GOAL #1   Title Pt will improve her FOTO score from 50% limitaion to </= 40% limitation to improve functional mobility.    Baseline 50% limitation   Time 6   Period Weeks   Status New     PT LONG TERM GOAL #2   Title pt will demonstrate reduced muscle spasm to promote improvement of cervical moblity and reduce pain to </=4/10 ( 12/27/2015)    Baseline 8/10 pain on 12/27/15   Time 6   Period Weeks     PT LONG TERM GOAL #3   Title pt will increase her cervical moblity by >/= 10 degrees to improve safety with driving and functional mobility. (12/27/15).   Time 6   Period Weeks   Status New               Plan - 01/11/16 1105    Clinical Impression Statement pain 2/10 at end of session.  Focus on pain mostly due to pain flare.  progress toward HEP Goal,  Able to get shoulders within 1 finger width from mat .(Fist fit there initially)   PT Next Visit Plan Dry needling, cervical ROM, Stretching, Shoulder strengthening, review decompression   PT Home Exercise Plan upper trap stretch, shoulder retraction, cervical flexion/rotation   Consulted and Agree with Plan of Care Patient      Patient will benefit from skilled therapeutic intervention in order to improve the following deficits and impairments:  Decreased activity tolerance, Impaired perceived functional ability, Postural dysfunction, Improper body mechanics, Decreased strength, Decreased mobility, Impaired UE functional use, Pain  Visit Diagnosis: Neck pain  Muscle spasms of neck  Decreased ROM of intervertebral discs of cervical spine  Abnormal posture     Problem  List Patient Active Problem List   Diagnosis Date Noted  . Belching 01/03/2016  . Bloating 05/23/2015  . Diarrhea 05/23/2015  . Heartburn 05/23/2015  . Bilateral occipital neuralgia 11/23/2014  . Cervicogenic headache 11/23/2014  . Neck pain 11/23/2014  . OSA on CPAP 11/23/2014  . Generalized anxiety disorder 02/04/2014  . Labile hypertension   .  Fibromyalgia   . Cervical spondylolysis 12/24/2013  . CAD (coronary artery disease) 12/24/2013  . Hypertensive emergency 12/23/2013  . Acute encephalopathy 12/23/2013  . Bronchitis, chronic obstructive w acute bronchitis ([place]) 12/21/2013  . Diverticulosis of colon without hemorrhage 11/15/2013  . Hot flashes related to aromatase inhibitor therapy 07/27/2013  . Osteopenia 07/27/2013  . DJD (degenerative joint disease) 09/29/2012  . Breast cancer, Left 12/20/2010  . Leg pain 11/28/2010  . FATTY LIVER DISEASE 01/09/2010  . GASTRITIS 12/16/2007  . Allergic rhinitis 02/18/2007  . DIZZINESS, CHRONIC 02/18/2007  . Headache in back of head 02/18/2007  . Mixed hyperlipidemia 12/23/2006  . GLAUCOMA 12/23/2006  . Coronary atherosclerosis 12/23/2006  . Asthma 12/23/2006  . GERD 12/23/2006  . IRRITABLE BOWEL SYNDROME 12/23/2006  . Myalgia and myositis 12/23/2006    HARRIS,KAREN PTA 01/11/2016, 3:17 PM  [facility] [address] [place], [place], [address] Phone: [phone]   Fax:  [phone]  Name: DORIANNA HILLIAN MRN: [id number] Date of Birth: March 10, 1935

## 2016-01-16 ENCOUNTER — Ambulatory Visit: Payer: Medicare Other | Admitting: Physical Therapy

## 2016-01-16 DIAGNOSIS — M545 Low back pain: Secondary | ICD-10-CM | POA: Diagnosis not present

## 2016-01-16 DIAGNOSIS — M542 Cervicalgia: Secondary | ICD-10-CM

## 2016-01-16 DIAGNOSIS — M5382 Other specified dorsopathies, cervical region: Secondary | ICD-10-CM

## 2016-01-16 DIAGNOSIS — R293 Abnormal posture: Secondary | ICD-10-CM

## 2016-01-16 DIAGNOSIS — M62838 Other muscle spasm: Secondary | ICD-10-CM

## 2016-01-16 NOTE — Therapy (Signed)
[place], [place], [address] Phone: [phone]   Fax:  [phone]  Physical Therapy Treatment  Patient Details  Name: [name] MRN: [id number] Date of Birth: [birth year] Referring Provider: Ellouise Newer, MD  Encounter Date: 01/16/2016      PT End of Session - 01/16/16 1212    Visit Number 5   Number of Visits 12   Date for PT Re-Evaluation 02/26/15   PT Start Time 1016   PT Stop Time 1114   PT Time Calculation (min) 58 min   Activity Tolerance Patient tolerated treatment well   Behavior During Therapy [facility] for tasks assessed/performed      Past Medical History:  Diagnosis Date  . Asthma   . Atrophic vaginitis   . Breast cancer, Left 12/20/2010   NO BLOOD PRESSURE CHECKS OR STICKS IN LEFT ARM  . CAD (coronary artery disease)    a. 01/19/2010 s/p CABG x 3, lima->lad, vg->diag, vg->om1;  b. 06/2011 :Lexi MV: EF 84%, No ischemia. c. 01/06/14 s/p negative nuclear stress test with EF >70%  . Cervical arthritis ([place])   . Colonic polyp 04-27-2009   tubular adenoma  . Diverticulosis of colon (without mention of hemorrhage)   . Fibromyalgia   . Gallstones   . GERD (gastroesophageal reflux disease)   . Glaucoma   . Headache(784.0)    a. frequently assocaited with high BPs  . Hiatal hernia   . Hypercholesterolemia   . Irritable bowel syndrome   . Labile hypertension     Past Surgical History:  Procedure Laterality Date  . [place] STUDY N/A 01/08/2016   Procedure: [place] STUDY;  Surgeon: Ladene Artist, MD;  [facility];  Service: Endoscopy;  Laterality: N/A;  . BREAST LUMPECTOMY Left 02/06/11  . CATARACT EXTRACTION, BILATERAL     bilateral caaract removal,  . CORONARY ANGIOPLASTY WITH STENT PLACEMENT     Stent 2007  . CORONARY ARTERY BYPASS GRAFT    . ESOPHAGEAL MANOMETRY N/A 01/08/2016   Procedure: ESOPHAGEAL MANOMETRY (EM);  Surgeon: Ladene Artist, MD;  [facility]:  [facility];  Service: Endoscopy;  Laterality: N/A;  . open heart surgery     01/19/2010    There were no vitals filed for this visit.      Subjective Assessment - 01/16/16 1021    Subjective 3-4/10 up to 6-7/10,  sometimes no pain. I have been working on my posture with doing things around the house.  Does not get Headaches every day and they are not as bad.    Pain Score 4    Pain Location Neck   Pain Descriptors / Indicators Sore;Tightness;Headache   Pain Type Chronic pain   Pain Radiating Towards top of head   Pain Frequency Intermittent   Aggravating Factors  Forgetting to pay attention to posture through day.   Pain Relieving Factors tylenol, heat, exercise   Effect of Pain on Daily Activities ADL difficult   Multiple Pain Sites No                         [facility] Adult PT Treatment/Exercise - 01/16/16 0001      Neck Exercises: Machines for Strengthening   UBE (Upper Arm Bike) L1 30 seconds each way stopped due to tightness increased in neck  Nustep L1 arms and legs , 8 minutes   Other Machines for Strengthening Nustep, L1, 8 minutes  Neck Exercises: Supine   Other Supine Exercise Decompression series,  5 minutes decompression exercise, shoulder press, head press, leg lengthener, leg press 5 x 5 seconds, HEP  lightheaded upon sitting after this.   Other Supine Exercise supine scapular stabilization, yellow band, 10 X cues intermittantly for technique.  ER, narrow grip, horizontal pulls and diagonals. each 10 x     Moist Heat Therapy   Number Minutes Moist Heat 10 Minutes   Moist Heat Location Cervical  extra layers.     Neck Exercises: Stretches   Other Neck Stretches doorway stretch, single arm 3 X 30 added to HEP                  PT Short Term Goals - 01/16/16 1216      PT SHORT TERM GOAL #1   Title pt will be I with initial HEP  (12/27/15)   Baseline independent   Time 3   Period Weeks   Status Achieved     PT SHORT TERM GOAL  #2   Title pt will be able to verbalize and demonstrate techniques to reduce neck pain and headaches via postural awarenss, lifting and carrying mechanics and HEP (12/27/2015)   Baseline 2 out of three (Posture, exercise)   Time 3   Period Weeks   Status On-going           PT Long Term Goals - 12/27/15 1320      PT LONG TERM GOAL #1   Title Pt will improve her FOTO score from 50% limitaion to </= 40% limitation to improve functional mobility.    Baseline 50% limitation   Time 6   Period Weeks   Status New     PT LONG TERM GOAL #2   Title pt will demonstrate reduced muscle spasm to promote improvement of cervical moblity and reduce pain to </=4/10 ( 12/27/2015)    Baseline 8/10 pain on 12/27/15   Time 6   Period Weeks     PT LONG TERM GOAL #3   Title pt will increase her cervical moblity by >/= 10 degrees to improve safety with driving and functional mobility. (12/27/15).   Time 6   Period Weeks   Status New               Plan - 01/16/16 1213    Clinical Impression Statement No pain at he end of session. "I can tell I have done someting". HA now intermittant.  Progress toward HEP goals.  Posture standing improved post exercise.   PT Next Visit Plan review decompression, Check specific goals.   PT Home Exercise Plan upper trap stretch, shoulder retraction, cervical flexion/rotation, 11/21: decompression series   Consulted and Agree with Plan of Care Patient      Patient will benefit from skilled therapeutic intervention in order to improve the following deficits and impairments:  Decreased activity tolerance, Impaired perceived functional ability, Postural dysfunction, Improper body mechanics, Decreased strength, Decreased mobility, Impaired UE functional use, Pain  Visit Diagnosis: Neck pain  Muscle spasms of neck  Decreased ROM of intervertebral discs of cervical spine  Abnormal posture     Problem List Patient Active Problem List   Diagnosis Date Noted   . Belching 01/03/2016  . Bloating 05/23/2015  . Diarrhea 05/23/2015  . Heartburn 05/23/2015  . Bilateral occipital neuralgia 11/23/2014  . Cervicogenic headache 11/23/2014  . Neck pain 11/23/2014  . OSA on CPAP 11/23/2014  . Generalized anxiety disorder 02/04/2014  . Labile  hypertension   . Fibromyalgia   . Cervical spondylolysis 12/24/2013  . CAD (coronary artery disease) 12/24/2013  . Hypertensive emergency 12/23/2013  . Acute encephalopathy 12/23/2013  . Bronchitis, chronic obstructive w acute bronchitis ([place]) 12/21/2013  . Diverticulosis of colon without hemorrhage 11/15/2013  . Hot flashes related to aromatase inhibitor therapy 07/27/2013  . Osteopenia 07/27/2013  . DJD (degenerative joint disease) 09/29/2012  . Breast cancer, Left 12/20/2010  . Leg pain 11/28/2010  . FATTY LIVER DISEASE 01/09/2010  . GASTRITIS 12/16/2007  . Allergic rhinitis 02/18/2007  . DIZZINESS, CHRONIC 02/18/2007  . Headache in back of head 02/18/2007  . Mixed hyperlipidemia 12/23/2006  . GLAUCOMA 12/23/2006  . Coronary atherosclerosis 12/23/2006  . Asthma 12/23/2006  . GERD 12/23/2006  . IRRITABLE BOWEL SYNDROME 12/23/2006  . Myalgia and myositis 12/23/2006    Jamall Strohmeier PTA 01/16/2016, 12:19 PM  [facility] [address] [place], [place], [address] Phone: [phone]   Fax:  [phone]  Name: [name] MRN: [id number] Date of Birth: 1936-01-26

## 2016-01-23 ENCOUNTER — Ambulatory Visit: Payer: Medicare Other | Admitting: Physical Therapy

## 2016-01-23 DIAGNOSIS — M62838 Other muscle spasm: Secondary | ICD-10-CM

## 2016-01-23 DIAGNOSIS — M545 Low back pain, unspecified: Secondary | ICD-10-CM

## 2016-01-23 DIAGNOSIS — R293 Abnormal posture: Secondary | ICD-10-CM

## 2016-01-23 DIAGNOSIS — J301 Allergic rhinitis due to pollen: Secondary | ICD-10-CM | POA: Diagnosis not present

## 2016-01-23 DIAGNOSIS — M542 Cervicalgia: Secondary | ICD-10-CM | POA: Diagnosis not present

## 2016-01-23 DIAGNOSIS — J3089 Other allergic rhinitis: Secondary | ICD-10-CM | POA: Diagnosis not present

## 2016-01-23 DIAGNOSIS — M5382 Other specified dorsopathies, cervical region: Secondary | ICD-10-CM

## 2016-01-23 NOTE — Therapy (Signed)
[place], [place], [address] Phone: [phone]   Fax:  [phone]  Physical Therapy Treatment  Patient Details  Name: [name] MRN: [id number] Date of Birth: 1936/02/23 Referring Provider: Ellouise Newer, MD  Encounter Date: 01/23/2016      PT End of Session - 01/23/16 1045    Visit Number 6   Number of Visits 12   Date for PT Re-Evaluation 02/26/15   Authorization Type KX modifier 15 visit   PT Start Time 1030   PT Stop Time 1100   PT Time Calculation (min) 30 min   Activity Tolerance Patient tolerated treatment well   Behavior During Therapy [facility] for tasks assessed/performed      Past Medical History:  Diagnosis Date  . Asthma   . Atrophic vaginitis   . Breast cancer, Left 12/20/2010   NO BLOOD PRESSURE CHECKS OR STICKS IN LEFT ARM  . CAD (coronary artery disease)    a. 01/19/2010 s/p CABG x 3, lima->lad, vg->diag, vg->om1;  b. 06/2011 :Lexi MV: EF 84%, No ischemia. c. 01/06/14 s/p negative nuclear stress test with EF >70%  . Cervical arthritis ([place])   . Colonic polyp 04-27-2009   tubular adenoma  . Diverticulosis of colon (without mention of hemorrhage)   . Fibromyalgia   . Gallstones   . GERD (gastroesophageal reflux disease)   . Glaucoma   . Headache(784.0)    a. frequently assocaited with high BPs  . Hiatal hernia   . Hypercholesterolemia   . Irritable bowel syndrome   . Labile hypertension     Past Surgical History:  Procedure Laterality Date  . [place] STUDY N/A 01/08/2016   Procedure: [place] STUDY;  Surgeon: Ladene Artist, MD;  [facility];  Service: Endoscopy;  Laterality: N/A;  . BREAST LUMPECTOMY Left 02/06/11  . CATARACT EXTRACTION, BILATERAL     bilateral caaract removal,  . CORONARY ANGIOPLASTY WITH STENT PLACEMENT     Stent 2007  . CORONARY ARTERY BYPASS GRAFT    . ESOPHAGEAL MANOMETRY N/A 01/08/2016   Procedure: ESOPHAGEAL MANOMETRY  (EM);  Surgeon: Ladene Artist, MD;  [facility];  Service: Endoscopy;  Laterality: N/A;  . open heart surgery     01/19/2010    There were no vitals filed for this visit.      Subjective Assessment - 01/23/16 1037    Subjective pt reporting 6-7/10 in low back pain across the middle. Pt also reporting R arm tingling all the way down the arm. Pt reporting no pain in R arm or neck.    Limitations House hold activities;Lifting   How long can you sit comfortably? 30 minutes   How long can you stand comfortably? unlimited   How long can you walk comfortably? unlimited   Diagnostic tests MRI in past   Patient Stated Goals Stop hurting, be able to perform my household chores.    Currently in Pain? Yes   Pain Score 7    Pain Location Back   Pain Orientation Lower;Medial   Pain Descriptors / Indicators Aching;Tightness   Pain Type Chronic pain   Pain Onset More than a month ago   Pain Frequency Constant   Aggravating Factors  lifting, household chores, cooking over thansgiving, standing in one spot   Pain Relieving Factors tylenol,  some exercises   Effect of Pain on Daily Activities ADL's are difficutl   Multiple Pain Sites Yes   Pain Score 0  Pain Location Neck   Pain Orientation Right   Pain Descriptors / Indicators Tightness   Pain Type Chronic pain   Pain Radiating Towards down R arm into finger tips, It woke me up last night.    Pain Onset More than a month ago   Pain Frequency Intermittent            [facility] PT Assessment - 01/23/16 0001      AROM   Cervical Flexion 25   Cervical Extension 22   Cervical - Right Side Bend 15   Cervical - Left Side Bend 20   Cervical - Right Rotation 55   Cervical - Left Rotation 45                     [facility] Adult PT Treatment/Exercise - 01/23/16 0001      Exercises   Exercises Lumbar     Neck Exercises: Machines for Strengthening   UBE (Upper Arm Bike) L1 1 minute each direction   Other Machines for  Strengthening NuStep: L2 8 minutes     Neck Exercises: Supine   Neck Retraction 5 reps   Other Supine Exercise Decompression series,  decompression exercise, shoulder press, head press, leg lengthener, leg press 5 x 5 seconds, HEP  lightheaded upon sitting after this.   Other Supine Exercise supine scapular stabilization, yellow band, 10 X cues intermittantly for technique.  ER, narrow grip, horizontal pulls and diagonals. each 10 x     Moist Heat Therapy   Number Minutes Moist Heat 15 Minutes   Moist Heat Location Cervical;Other (comment)  mid and lower back     Neck Exercises: Stretches   Other Neck Stretches doorway stretch, single arm 3 X 30 added to HEP                PT Education - 01/23/16 1045    Education provided Yes   Education Details continue current HEP and importance of consistency   Person(s) Educated Patient   Methods Explanation   Comprehension Verbalized understanding          PT Short Term Goals - 01/23/16 1053      PT SHORT TERM GOAL #1   Title pt will be I with initial HEP  (12/27/15)   Baseline independent   Period Weeks   Status Achieved     PT SHORT TERM GOAL #2   Title pt will be able to verbalize and demonstrate techniques to reduce neck pain and headaches via postural awarenss, lifting and carrying mechanics and HEP (12/27/2015)   Baseline Pt reporting no pain today   Time 3   Period Weeks   Status On-going           PT Long Term Goals - 01/23/16 1055      PT LONG TERM GOAL #1   Title Pt will improve her FOTO score from 50% limitaion to </= 40% limitation to improve functional mobility.    Baseline 50% limitation   Time 6   Status Unable to assess     PT LONG TERM GOAL #2   Title pt will demonstrate reduced muscle spasm to promote improvement of cervical moblity and reduce pain to </=4/10 ( 12/27/2015)    Baseline 8/10 pain on 12/27/15   Time 6   Period Weeks   Status Achieved     PT LONG TERM GOAL #3   Title pt will  increase her cervical moblity by >/= 10 degrees to improve safety  with driving and functional mobility. (12/27/15).   Baseline met in rotation    Time 6   Period Weeks   Status New     PT LONG TERM GOAL #4   Title pt will be able to lift and carry >/=10# at shoulder height or higher with </= 4/10 pain to assist with ADLS (08/02/5914)   Time 6   Period Weeks   Status Achieved               Plan - 01/23/16 1046    Clinical Impression Statement Pt reporting decreased pain at end of session in her low back after performing the decompression exercises in supine. Pt is progressing towrd her LTG's set at inital evaluation. Skilled Pt needed to continue with edu and exercises to improve pt's functional mobility and decrease pain.    Rehab Potential Good   PT Frequency 2x / week   PT Duration 6 weeks   PT Treatment/Interventions ADLs/Self Care Home Management;Moist Heat;Traction;Functional mobility training;Passive range of motion;Taping;Dry needling;Therapeutic activities;Therapeutic exercise;Patient/family education;Ultrasound;Electrical Stimulation   PT Next Visit Plan Decompression Exercises, Low back stretches/strengthening, Shoulder exercises as pt tolerates for headaches and right arm pain, bridges   PT Home Exercise Plan upper trap stretch, shoulder retraction, cervical flexion/rotation, 11/21: decompression series   Consulted and Agree with Plan of Care Patient      Patient will benefit from skilled therapeutic intervention in order to improve the following deficits and impairments:  Decreased activity tolerance, Impaired perceived functional ability, Postural dysfunction, Improper body mechanics, Decreased strength, Decreased mobility, Impaired UE functional use, Pain  Visit Diagnosis: Neck pain  Muscle spasms of neck  Decreased ROM of intervertebral discs of cervical spine  Abnormal posture  Acute bilateral low back pain without sciatica     Problem List Patient  Active Problem List   Diagnosis Date Noted  . Belching 01/03/2016  . Bloating 05/23/2015  . Diarrhea 05/23/2015  . Heartburn 05/23/2015  . Bilateral occipital neuralgia 11/23/2014  . Cervicogenic headache 11/23/2014  . Neck pain 11/23/2014  . OSA on CPAP 11/23/2014  . Generalized anxiety disorder 02/04/2014  . Labile hypertension   . Fibromyalgia   . Cervical spondylolysis 12/24/2013  . CAD (coronary artery disease) 12/24/2013  . Hypertensive emergency 12/23/2013  . Acute encephalopathy 12/23/2013  . Bronchitis, chronic obstructive w acute bronchitis ([place]) 12/21/2013  . Diverticulosis of colon without hemorrhage 11/15/2013  . Hot flashes related to aromatase inhibitor therapy 07/27/2013  . Osteopenia 07/27/2013  . DJD (degenerative joint disease) 09/29/2012  . Breast cancer, Left 12/20/2010  . Leg pain 11/28/2010  . FATTY LIVER DISEASE 01/09/2010  . GASTRITIS 12/16/2007  . Allergic rhinitis 02/18/2007  . DIZZINESS, CHRONIC 02/18/2007  . Headache in back of head 02/18/2007  . Mixed hyperlipidemia 12/23/2006  . GLAUCOMA 12/23/2006  . Coronary atherosclerosis 12/23/2006  . Asthma 12/23/2006  . GERD 12/23/2006  . IRRITABLE BOWEL SYNDROME 12/23/2006  . Myalgia and myositis 12/23/2006    Oretha Caprice, MPT 01/23/2016, 12:17 PM  [facility] [address] [place], [place], [address] Phone: [phone]   Fax:  [phone]  Name: [name] MRN: [id number] Date of Birth: October 14, 1935

## 2016-01-25 ENCOUNTER — Ambulatory Visit: Payer: Medicare Other | Admitting: Physical Therapy

## 2016-01-25 DIAGNOSIS — R293 Abnormal posture: Secondary | ICD-10-CM | POA: Diagnosis not present

## 2016-01-25 DIAGNOSIS — M62838 Other muscle spasm: Secondary | ICD-10-CM

## 2016-01-25 DIAGNOSIS — M542 Cervicalgia: Secondary | ICD-10-CM

## 2016-01-25 DIAGNOSIS — M5382 Other specified dorsopathies, cervical region: Secondary | ICD-10-CM

## 2016-01-25 DIAGNOSIS — M545 Low back pain, unspecified: Secondary | ICD-10-CM

## 2016-01-25 NOTE — Therapy (Signed)
[place], [place], [address] Phone: [phone]   Fax:  [phone]  Physical Therapy Treatment  Patient Details  Name: [name] MRN: [id number] Date of Birth: 1935/08/15 Referring Provider: Ellouise Newer, MD  Encounter Date: 01/25/2016      PT End of Session - 01/25/16 1055    Visit Number 7   Number of Visits 12   Date for PT Re-Evaluation 02/26/15   Authorization Type KX modifier 15 visit   PT Start Time 1016   PT Stop Time 1103   PT Time Calculation (min) 47 min   Activity Tolerance Patient tolerated treatment well   Behavior During Therapy [facility] for tasks assessed/performed      Past Medical History:  Diagnosis Date  . Asthma   . Atrophic vaginitis   . Breast cancer, Left 12/20/2010   NO BLOOD PRESSURE CHECKS OR STICKS IN LEFT ARM  . CAD (coronary artery disease)    a. 01/19/2010 s/p CABG x 3, lima->lad, vg->diag, vg->om1;  b. 06/2011 :Lexi MV: EF 84%, No ischemia. c. 01/06/14 s/p negative nuclear stress test with EF >70%  . Cervical arthritis ([place])   . Colonic polyp 04-27-2009   tubular adenoma  . Diverticulosis of colon (without mention of hemorrhage)   . Fibromyalgia   . Gallstones   . GERD (gastroesophageal reflux disease)   . Glaucoma   . Headache(784.0)    a. frequently assocaited with high BPs  . Hiatal hernia   . Hypercholesterolemia   . Irritable bowel syndrome   . Labile hypertension     Past Surgical History:  Procedure Laterality Date  . [place] STUDY N/A 01/08/2016   Procedure: [place] STUDY;  Surgeon: Ladene Artist, MD;  [facility]: [facility];  Service: Endoscopy;  Laterality: N/A;  . BREAST LUMPECTOMY Left 02/06/11  . CATARACT EXTRACTION, BILATERAL     bilateral caaract removal,  . CORONARY ANGIOPLASTY WITH STENT PLACEMENT     Stent 2007  . CORONARY ARTERY BYPASS GRAFT    . ESOPHAGEAL MANOMETRY N/A 01/08/2016   Procedure: ESOPHAGEAL MANOMETRY  (EM);  Surgeon: Ladene Artist, MD;  [facility];  Service: Endoscopy;  Laterality: N/A;  . open heart surgery     01/19/2010    There were no vitals filed for this visit.      Subjective Assessment - 01/25/16 1024    Subjective "I was doing fairly well but I may have done too much over thanks giving, and it is going into my fingers"   Currently in Pain? Yes   Pain Score 6    Pain Location Back   Pain Orientation Upper   Pain Descriptors / Indicators Aching;Sore   Pain Type Chronic pain   Pain Onset More than a month ago   Pain Frequency Intermittent   Pain Score 6   Pain Location Neck   Pain Orientation Right   Pain Type Chronic pain   Pain Onset More than a month ago   Pain Frequency Intermittent   Aggravating Factors  turning  the head to the R, looking down   Pain Relieving Factors resting, tylenol                         [facility] Adult PT Treatment/Exercise - 01/25/16 0001      Moist Heat Therapy   Number Minutes Moist Heat 10 Minutes   Moist Heat Location Cervical;Other (comment)  Manual Therapy   Manual Therapy Joint mobilization   Joint Mobilization 1st rib inferior mobs   Soft tissue mobilization IASTM over the R upper trap and scalenes,      Neck Exercises: Stretches   Upper Trapezius Stretch 2 reps;30 seconds          Trigger Point Dry Needling - 01/25/16 1044    Consent Given? Yes   Education Handout Provided Yes   Muscles Treated Upper Body Upper trapezius  scalenes   Upper Trapezius Response Palpable increased muscle length;Twitch reponse elicited              PT Education - 01/25/16 1054    Education provided Yes   Education Details anatomy of the neck in regard to muscle tightness and referral for headaches. how muscles can form trigger points, benefits of DN, what to expect and after care.    Person(s) Educated Patient   Methods Explanation;Verbal cues   Comprehension Verbalized understanding;Verbal cues  required          PT Short Term Goals - 01/23/16 1053      PT SHORT TERM GOAL #1   Title pt will be I with initial HEP  (12/27/15)   Baseline independent   Period Weeks   Status Achieved     PT SHORT TERM GOAL #2   Title pt will be able to verbalize and demonstrate techniques to reduce neck pain and headaches via postural awarenss, lifting and carrying mechanics and HEP (12/27/2015)   Baseline Pt reporting no pain today   Time 3   Period Weeks   Status On-going           PT Long Term Goals - 01/23/16 1055      PT LONG TERM GOAL #1   Title Pt will improve her FOTO score from 50% limitaion to </= 40% limitation to improve functional mobility.    Baseline 50% limitation   Time 6   Status Unable to assess     PT LONG TERM GOAL #2   Title pt will demonstrate reduced muscle spasm to promote improvement of cervical moblity and reduce pain to </=4/10 ( 12/27/2015)    Baseline 8/10 pain on 12/27/15   Time 6   Period Weeks   Status Achieved     PT LONG TERM GOAL #3   Title pt will increase her cervical moblity by >/= 10 degrees to improve safety with driving and functional mobility. (12/27/15).   Baseline met in rotation    Time 6   Period Weeks   Status New     PT LONG TERM GOAL #4   Title pt will be able to lift and carry >/=10# at shoulder height or higher with </= 4/10 pain to assist with ADLS (02/28/9700)   Time 6   Period Weeks   Status Achieved               Plan - 01/25/16 1056    Clinical Impression Statement pt conitnues to report pain in the neck and back. DN was performed on the scalenes and R upper trap; followed with soft tissue work and stretching. She reported decreased pain and tightness. continued MHP post session for pain.    PT Next Visit Plan assess response to DN, Decompression Exercises, Low back stretches/strengthening, Shoulder exercises as pt tolerates for headaches and right arm pain, bridges   PT Home Exercise Plan upper trap stretch,  shoulder retraction, cervical flexion/rotation, 11/21: decompression series   Consulted and Agree  with Plan of Care Patient      Patient will benefit from skilled therapeutic intervention in order to improve the following deficits and impairments:  Decreased activity tolerance, Impaired perceived functional ability, Postural dysfunction, Improper body mechanics, Decreased strength, Decreased mobility, Impaired UE functional use, Pain  Visit Diagnosis: Neck pain  Muscle spasms of neck  Decreased ROM of intervertebral discs of cervical spine  Abnormal posture  Acute bilateral low back pain without sciatica     Problem List Patient Active Problem List   Diagnosis Date Noted  . Belching 01/03/2016  . Bloating 05/23/2015  . Diarrhea 05/23/2015  . Heartburn 05/23/2015  . Bilateral occipital neuralgia 11/23/2014  . Cervicogenic headache 11/23/2014  . Neck pain 11/23/2014  . OSA on CPAP 11/23/2014  . Generalized anxiety disorder 02/04/2014  . Labile hypertension   . Fibromyalgia   . Cervical spondylolysis 12/24/2013  . CAD (coronary artery disease) 12/24/2013  . Hypertensive emergency 12/23/2013  . Acute encephalopathy 12/23/2013  . Bronchitis, chronic obstructive w acute bronchitis ([place]) 12/21/2013  . Diverticulosis of colon without hemorrhage 11/15/2013  . Hot flashes related to aromatase inhibitor therapy 07/27/2013  . Osteopenia 07/27/2013  . DJD (degenerative joint disease) 09/29/2012  . Breast cancer, Left 12/20/2010  . Leg pain 11/28/2010  . FATTY LIVER DISEASE 01/09/2010  . GASTRITIS 12/16/2007  . Allergic rhinitis 02/18/2007  . DIZZINESS, CHRONIC 02/18/2007  . Headache in back of head 02/18/2007  . Mixed hyperlipidemia 12/23/2006  . GLAUCOMA 12/23/2006  . Coronary atherosclerosis 12/23/2006  . Asthma 12/23/2006  . GERD 12/23/2006  . IRRITABLE BOWEL SYNDROME 12/23/2006  . Myalgia and myositis 12/23/2006   Starr Lake PT, DPT, LAT, ATC  01/25/16  11:05  AM      [place] [facility] [address] [place], [place], [address] Phone: [phone]   Fax:  [phone]  Name: TELA KOTECKI MRN: [id number] Date of Birth: 1935/11/16

## 2016-01-31 ENCOUNTER — Other Ambulatory Visit: Payer: Self-pay

## 2016-01-31 MED ORDER — DICYCLOMINE HCL 10 MG PO CAPS: 10.0000 mg | ORAL_CAPSULE | Freq: Three times a day (TID) | ORAL | 3 refills | Status: DC

## 2016-02-06 ENCOUNTER — Ambulatory Visit: Payer: Medicare Other | Attending: Pulmonary Disease | Admitting: Physical Therapy

## 2016-02-06 DIAGNOSIS — M545 Low back pain, unspecified: Secondary | ICD-10-CM

## 2016-02-06 DIAGNOSIS — R293 Abnormal posture: Secondary | ICD-10-CM | POA: Insufficient documentation

## 2016-02-06 DIAGNOSIS — M542 Cervicalgia: Secondary | ICD-10-CM | POA: Diagnosis not present

## 2016-02-06 DIAGNOSIS — M62838 Other muscle spasm: Secondary | ICD-10-CM | POA: Diagnosis not present

## 2016-02-06 DIAGNOSIS — J301 Allergic rhinitis due to pollen: Secondary | ICD-10-CM | POA: Diagnosis not present

## 2016-02-06 DIAGNOSIS — M5382 Other specified dorsopathies, cervical region: Secondary | ICD-10-CM | POA: Diagnosis not present

## 2016-02-06 DIAGNOSIS — J3089 Other allergic rhinitis: Secondary | ICD-10-CM | POA: Diagnosis not present

## 2016-02-06 NOTE — Therapy (Signed)
[place], [place], [address] Phone: [phone]   Fax:  [phone]  Physical Therapy Treatment  Patient Details  Name: [name] MRN: [id number] Date of Birth: 06-18-1935 Referring Provider: Ellouise Newer, MD  Encounter Date: 02/06/2016      PT End of Session - 02/06/16 1219    Visit Number 8   Number of Visits 12   Date for PT Re-Evaluation 02/26/15   PT Start Time 1103   PT Stop Time 1200   PT Time Calculation (min) 57 min   Activity Tolerance Patient tolerated treatment well   Behavior During Therapy [facility] for tasks assessed/performed      Past Medical History:  Diagnosis Date  . Asthma   . Atrophic vaginitis   . Breast cancer, Left 12/20/2010   NO BLOOD PRESSURE CHECKS OR STICKS IN LEFT ARM  . CAD (coronary artery disease)    a. 01/19/2010 s/p CABG x 3, lima->lad, vg->diag, vg->om1;  b. 06/2011 :Lexi MV: EF 84%, No ischemia. c. 01/06/14 s/p negative nuclear stress test with EF >70%  . Cervical arthritis ([place])   . Colonic polyp 04-27-2009   tubular adenoma  . Diverticulosis of colon (without mention of hemorrhage)   . Fibromyalgia   . Gallstones   . GERD (gastroesophageal reflux disease)   . Glaucoma   . Headache(784.0)    a. frequently assocaited with high BPs  . Hiatal hernia   . Hypercholesterolemia   . Irritable bowel syndrome   . Labile hypertension     Past Surgical History:  Procedure Laterality Date  . [place] STUDY N/A 01/08/2016   Procedure: [place] STUDY;  Surgeon: Ladene Artist, MD;  [facility];  Service: Endoscopy;  Laterality: N/A;  . BREAST LUMPECTOMY Left 02/06/11  . CATARACT EXTRACTION, BILATERAL     bilateral caaract removal,  . CORONARY ANGIOPLASTY WITH STENT PLACEMENT     Stent 2007  . CORONARY ARTERY BYPASS GRAFT    . ESOPHAGEAL MANOMETRY N/A 01/08/2016   Procedure: ESOPHAGEAL MANOMETRY (EM);  Surgeon: Ladene Artist, MD;  Location:  [facility];  Service: Endoscopy;  Laterality: N/A;  . open heart surgery     01/19/2010    There were no vitals filed for this visit.      Subjective Assessment - 02/06/16 1023    Subjective DN made me sore for a few days.  It still feels like a rope.  No headach.  I have not done the decompression.  I tried heat and it did not help.   Currently in Pain? Yes   Pain Score 5    Pain Location --  upper trap   Pain Orientation Right;Upper   Pain Descriptors / Indicators Tightness  Like a rope   Pain Type Chronic pain   Pain Radiating Towards top of shoulder   Pain Frequency Intermittent  HA 2-3 x a week   Aggravating Factors  using arms    Pain Relieving Factors medication, heat,  Neck stretches.   Effect of Pain on Daily Activities Unable to get as much work done and I don't do vacume as often   Pain Score 5   Pain Location Back   Pain Orientation Upper;Mid;Lower;Left;Right   Pain Descriptors / Indicators Sore;Throbbing;Tightness;Spasm   Pain Type Chronic pain   Aggravating Factors  vacume   Pain Relieving Factors resting tylenol            [facility] PT Assessment -  02/06/16 0001      AROM   Cervical - Right Rotation 50   Cervical - Left Rotation 58     Strength   Right Shoulder Flexion 4/5   Right Shoulder Extension 4/5   Right Shoulder ABduction 4-/5   Right Shoulder Internal Rotation 4-/5   Right Shoulder External Rotation 4/5                     [facility] Adult PT Treatment/Exercise - 02/06/16 0001      Self-Care   Self-Care ADL's;Posture   ADL's Vacume practice   Posture Sitting posture anatomy     Neck Exercises: Seated   Other Seated Exercise ROWS , red band  10 X   Other Seated Exercise ER, red band 10 X     Lumbar Exercises: Supine   Other Supine Lumbar Exercises decompression, leg lengthener, decompression, head press 5 X 5 second holds,  decompression pose 5 minutes.     Manual Therapy   Manual therapy comments neuromuscular  triggrtpoint decompression to right upper trap     Neck Exercises: Stretches   Levator Stretch 3 reps;10 seconds  and other, upper trap stretch 2-3 x each 10 second holds                PT Education - 02/06/16 1046    Education provided Yes   Education Details Posture and ADL   Person(s) Educated Patient   Methods Explanation;Demonstration;Verbal cues;Handout   Comprehension Verbalized understanding;Returned demonstration          PT Short Term Goals - 02/06/16 1225      PT SHORT TERM GOAL #1   Title pt will be I with initial HEP  (12/27/15)   Baseline independent   Time 3   Period Weeks   Status Achieved     PT SHORT TERM GOAL #2   Title pt will be able to verbalize and demonstrate techniques to reduce neck pain and headaches via postural awarenss, lifting and carrying mechanics and HEP (12/27/2015)   Baseline pain continues   Time 3   Period Weeks   Status On-going           PT Long Term Goals - 02/06/16 1226      PT LONG TERM GOAL #1   Title Pt will improve her FOTO score from 50% limitaion to </= 40% limitation to improve functional mobility.    Time 6   Period Weeks   Status Unable to assess     PT LONG TERM GOAL #2   Title pt will demonstrate reduced muscle spasm to promote improvement of cervical moblity and reduce pain to </=4/10 ( 12/27/2015)    Baseline Painful today   Time 6   Period Weeks   Status Achieved     PT LONG TERM GOAL #3   Title pt will increase her cervical moblity by >/= 10 degrees to improve safety with driving and functional mobility. (12/27/15).   Baseline improving, not sure if consistant   Time 6   Period Weeks   Status On-going     PT LONG TERM GOAL #4   Title pt will be able to lift and carry >/=10# at shoulder height or higher with </= 4/10 pain to assist with ADLS ([id number])   Time 6   Period Weeks   Status Achieved               Plan - 02/06/16 1220    Clinical Impression Statement  Patient has pain  that continues.  She does her stretches intermittantly and they help.  Her HA returned today when MMT of shoulder was done "Straining."  The DN irritated her pain.  Strength and ROM are improving see Flow sheet.   PT Next Visit Plan  FOTO. Decompression Exercises, Low back stretches/strengthening, Shoulder exercises as pt tolerates for headaches and right arm pain, bridges   PT Home Exercise Plan upper trap stretch, shoulder retraction, cervical flexion/rotation, 11/21: decompression series   Consulted and Agree with Plan of Care Patient      Patient will benefit from skilled therapeutic intervention in order to improve the following deficits and impairments:  Decreased activity tolerance, Impaired perceived functional ability, Postural dysfunction, Improper body mechanics, Decreased strength, Decreased mobility, Impaired UE functional use, Pain  Visit Diagnosis: Neck pain  Muscle spasms of neck  Decreased ROM of intervertebral discs of cervical spine  Abnormal posture  Acute bilateral low back pain without sciatica     Problem List Patient Active Problem List   Diagnosis Date Noted  . Belching 01/03/2016  . Bloating 05/23/2015  . Diarrhea 05/23/2015  . Heartburn 05/23/2015  . Bilateral occipital neuralgia 11/23/2014  . Cervicogenic headache 11/23/2014  . Neck pain 11/23/2014  . OSA on CPAP 11/23/2014  . Generalized anxiety disorder 02/04/2014  . Labile hypertension   . Fibromyalgia   . Cervical spondylolysis 12/24/2013  . CAD (coronary artery disease) 12/24/2013  . Hypertensive emergency 12/23/2013  . Acute encephalopathy 12/23/2013  . Bronchitis, chronic obstructive w acute bronchitis ([place]) 12/21/2013  . Diverticulosis of colon without hemorrhage 11/15/2013  . Hot flashes related to aromatase inhibitor therapy 07/27/2013  . Osteopenia 07/27/2013  . DJD (degenerative joint disease) 09/29/2012  . Breast cancer, Left 12/20/2010  . Leg pain 11/28/2010  . FATTY LIVER  DISEASE 01/09/2010  . GASTRITIS 12/16/2007  . Allergic rhinitis 02/18/2007  . DIZZINESS, CHRONIC 02/18/2007  . Headache in back of head 02/18/2007  . Mixed hyperlipidemia 12/23/2006  . GLAUCOMA 12/23/2006  . Coronary atherosclerosis 12/23/2006  . Asthma 12/23/2006  . Gastroesophageal reflux disease 12/23/2006  . IRRITABLE BOWEL SYNDROME 12/23/2006  . Myalgia and myositis 12/23/2006    Pierina Schuknecht PTA 02/06/2016, 12:29 PM  [facility] [address] [place], [place], [address] Phone: [phone]   Fax:  [phone]  Name: [name] MRN: [id number] Date of Birth: Jul 18, 1935

## 2016-02-06 NOTE — Patient Instructions (Signed)

## 2016-02-07 ENCOUNTER — Other Ambulatory Visit: Payer: Self-pay | Admitting: Cardiovascular Disease

## 2016-02-08 ENCOUNTER — Ambulatory Visit: Payer: Medicare Other | Admitting: Physical Therapy

## 2016-02-08 DIAGNOSIS — M5382 Other specified dorsopathies, cervical region: Secondary | ICD-10-CM | POA: Diagnosis not present

## 2016-02-08 DIAGNOSIS — R293 Abnormal posture: Secondary | ICD-10-CM | POA: Diagnosis not present

## 2016-02-08 DIAGNOSIS — M545 Low back pain, unspecified: Secondary | ICD-10-CM

## 2016-02-08 DIAGNOSIS — M542 Cervicalgia: Secondary | ICD-10-CM | POA: Diagnosis not present

## 2016-02-08 DIAGNOSIS — M62838 Other muscle spasm: Secondary | ICD-10-CM | POA: Diagnosis not present

## 2016-02-08 NOTE — Therapy (Signed)
[place], [place], [address] Phone: [phone]   Fax:  [phone]  Physical Therapy Treatment  Patient Details  Name: [name] MRN: [id number] Date of Birth: [birth year] Referring Provider: Ellouise Newer, MD  Encounter Date: 02/08/2016    Past Medical History:  Diagnosis Date  . Asthma   . Atrophic vaginitis   . Breast cancer, Left 12/20/2010   NO BLOOD PRESSURE CHECKS OR STICKS IN LEFT ARM  . CAD (coronary artery disease)    a. 01/19/2010 s/p CABG x 3, lima->lad, vg->diag, vg->om1;  b. 06/2011 :Lexi MV: EF 84%, No ischemia. c. 01/06/14 s/p negative nuclear stress test with EF >70%  . Cervical arthritis ([place])   . Colonic polyp 04-27-2009   tubular adenoma  . Diverticulosis of colon (without mention of hemorrhage)   . Fibromyalgia   . Gallstones   . GERD (gastroesophageal reflux disease)   . Glaucoma   . Headache(784.0)    a. frequently assocaited with high BPs  . Hiatal hernia   . Hypercholesterolemia   . Irritable bowel syndrome   . Labile hypertension     Past Surgical History:  Procedure Laterality Date  . [place] STUDY N/A 01/08/2016   Procedure: [place] STUDY;  Surgeon: Ladene Artist, MD;  [facility];  Service: Endoscopy;  Laterality: N/A;  . BREAST LUMPECTOMY Left 02/06/11  . CATARACT EXTRACTION, BILATERAL     bilateral caaract removal,  . CORONARY ANGIOPLASTY WITH STENT PLACEMENT     Stent 2007  . CORONARY ARTERY BYPASS GRAFT    . ESOPHAGEAL MANOMETRY N/A 01/08/2016   Procedure: ESOPHAGEAL MANOMETRY (EM);  Surgeon: Ladene Artist, MD;  [facility];  Service: Endoscopy;  Laterality: N/A;  . open heart surgery     01/19/2010    There were no vitals filed for this visit.      Subjective Assessment - 02/08/16 0934    Subjective "I am feeling alittle better in the shoulders, th eTPDN didn't seem to help last time"    Currently in Pain? Yes    Pain Score 4    Pain Location Shoulder   Pain Orientation Right;Upper   Pain Descriptors / Indicators Aching;Sore;Tightness   Pain Type Chronic pain   Pain Onset More than a month ago   Pain Frequency Intermittent            [facility] PT Assessment - 02/08/16 0001      AROM   Cervical - Right Side Bend 30   Cervical - Left Side Bend 22   Cervical - Right Rotation 61   Cervical - Left Rotation 60                     [facility] Adult PT Treatment/Exercise - 02/08/16 0001      Moist Heat Therapy   Number Minutes Moist Heat 10 Minutes   Moist Heat Location Cervical;Other (comment)  upper trap     Manual Therapy   Joint Mobilization cervical C3-C7 PA grade 3 mobs (in prone), T1-T8 pA grade 3, R lateral gapping c3-C6 grade 3 mobs   Soft tissue mobilization IASTM over the R upper trap/ levator scapulae and scalenes,      Neck Exercises: Stretches   Upper Trapezius Stretch 2 reps;30 seconds   Levator Stretch 3 reps;10 seconds          Trigger Point Dry Needling - 02/08/16 1019    Consent Given? Yes  Education Handout Provided Yes   Muscles Treated Upper Body Upper trapezius   Upper Trapezius Response Palpable increased muscle length;Twitch reponse elicited                PT Short Term Goals - 02/06/16 1225      PT SHORT TERM GOAL #1   Title pt will be I with initial HEP  (12/27/15)   Baseline independent   Time 3   Period Weeks   Status Achieved     PT SHORT TERM GOAL #2   Title pt will be able to verbalize and demonstrate techniques to reduce neck pain and headaches via postural awarenss, lifting and carrying mechanics and HEP (12/27/2015)   Baseline pain continues   Time 3   Period Weeks   Status On-going           PT Long Term Goals - 02/06/16 1226      PT LONG TERM GOAL #1   Title Pt will improve her FOTO score from 50% limitaion to </= 40% limitation to improve functional mobility.    Time 6   Period Weeks   Status Unable to  assess     PT LONG TERM GOAL #2   Title pt will demonstrate reduced muscle spasm to promote improvement of cervical moblity and reduce pain to </=4/10 ( 12/27/2015)    Baseline Painful today   Time 6   Period Weeks   Status Achieved     PT LONG TERM GOAL #3   Title pt will increase her cervical moblity by >/= 10 degrees to improve safety with driving and functional mobility. (12/27/15).   Baseline improving, not sure if consistant   Time 6   Period Weeks   Status On-going     PT LONG TERM GOAL #4   Title pt will be able to lift and carry >/=10# at shoulder height or higher with </= 4/10 pain to assist with ADLS ([id number])   Time 6   Period Weeks   Status Achieved               Plan - 02/08/16 1122    Clinical Impression Statement focused on manual today with DN of the R upper trap and levator scapulae, following TPDN performed soft tissue work to calm down tightness and pain. following cervical mobs she demonstrates improvement in cervical mobility.    PT Next Visit Plan  FOTO. Decompression Exercises, Low back stretches/strengthening, Shoulder exercises as pt tolerates for headaches and right arm pain, bridges   PT Home Exercise Plan upper trap stretch, shoulder retraction, cervical flexion/rotation, 11/21: decompression series   Consulted and Agree with Plan of Care Patient      Patient will benefit from skilled therapeutic intervention in order to improve the following deficits and impairments:  Decreased activity tolerance, Impaired perceived functional ability, Postural dysfunction, Improper body mechanics, Decreased strength, Decreased mobility, Impaired UE functional use, Pain  Visit Diagnosis: Neck pain  Muscle spasms of neck  Decreased ROM of intervertebral discs of cervical spine  Abnormal posture  Acute bilateral low back pain without sciatica     Problem List Patient Active Problem List   Diagnosis Date Noted  . Belching 01/03/2016  . Bloating  05/23/2015  . Diarrhea 05/23/2015  . Heartburn 05/23/2015  . Bilateral occipital neuralgia 11/23/2014  . Cervicogenic headache 11/23/2014  . Neck pain 11/23/2014  . OSA on CPAP 11/23/2014  . Generalized anxiety disorder 02/04/2014  . Labile hypertension   . Fibromyalgia   .  Cervical spondylolysis 12/24/2013  . CAD (coronary artery disease) 12/24/2013  . Hypertensive emergency 12/23/2013  . Acute encephalopathy 12/23/2013  . Bronchitis, chronic obstructive w acute bronchitis ([place]) 12/21/2013  . Diverticulosis of colon without hemorrhage 11/15/2013  . Hot flashes related to aromatase inhibitor therapy 07/27/2013  . Osteopenia 07/27/2013  . DJD (degenerative joint disease) 09/29/2012  . Breast cancer, Left 12/20/2010  . Leg pain 11/28/2010  . FATTY LIVER DISEASE 01/09/2010  . GASTRITIS 12/16/2007  . Allergic rhinitis 02/18/2007  . DIZZINESS, CHRONIC 02/18/2007  . Headache in back of head 02/18/2007  . Mixed hyperlipidemia 12/23/2006  . GLAUCOMA 12/23/2006  . Coronary atherosclerosis 12/23/2006  . Asthma 12/23/2006  . Gastroesophageal reflux disease 12/23/2006  . IRRITABLE BOWEL SYNDROME 12/23/2006  . Myalgia and myositis 12/23/2006   Starr Lake PT, DPT, LAT, ATC  02/08/16  11:26 AM      [place] [facility] [address] [place], [place], [address] Phone: [phone]   Fax:  [phone]  Name: [name] MRN: [id number] Date of Birth: [birth year]

## 2016-02-13 ENCOUNTER — Ambulatory Visit: Payer: Medicare Other | Admitting: Physical Therapy

## 2016-02-13 DIAGNOSIS — M5382 Other specified dorsopathies, cervical region: Secondary | ICD-10-CM | POA: Diagnosis not present

## 2016-02-13 DIAGNOSIS — M62838 Other muscle spasm: Secondary | ICD-10-CM | POA: Diagnosis not present

## 2016-02-13 DIAGNOSIS — M542 Cervicalgia: Secondary | ICD-10-CM | POA: Diagnosis not present

## 2016-02-13 DIAGNOSIS — M545 Low back pain, unspecified: Secondary | ICD-10-CM

## 2016-02-13 DIAGNOSIS — R293 Abnormal posture: Secondary | ICD-10-CM | POA: Diagnosis not present

## 2016-02-13 NOTE — Therapy (Signed)
[place], [place], [address] Phone: [phone]   Fax:  [phone]  Physical Therapy Treatment  Patient Details  Name: [name] MRN: [id number] Date of Birth: 11-20-1935 Referring Provider: Ellouise Newer, MD  Encounter Date: 02/13/2016      PT End of Session - 02/13/16 1217    Visit Number 9   Number of Visits 12   Date for PT Re-Evaluation 02/26/15   Authorization Time Period --  FOTO done 10 visit   PT Start Time 1016   PT Stop Time 1114   PT Time Calculation (min) 58 min   Activity Tolerance Patient tolerated treatment well   Behavior During Therapy [facility] for tasks assessed/performed      Past Medical History:  Diagnosis Date  . Asthma   . Atrophic vaginitis   . Breast cancer, Left 12/20/2010   NO BLOOD PRESSURE CHECKS OR STICKS IN LEFT ARM  . CAD (coronary artery disease)    a. 01/19/2010 s/p CABG x 3, lima->lad, vg->diag, vg->om1;  b. 06/2011 :Lexi MV: EF 84%, No ischemia. c. 01/06/14 s/p negative nuclear stress test with EF >70%  . Cervical arthritis ([place])   . Colonic polyp 04-27-2009   tubular adenoma  . Diverticulosis of colon (without mention of hemorrhage)   . Fibromyalgia   . Gallstones   . GERD (gastroesophageal reflux disease)   . Glaucoma   . Headache(784.0)    a. frequently assocaited with high BPs  . Hiatal hernia   . Hypercholesterolemia   . Irritable bowel syndrome   . Labile hypertension     Past Surgical History:  Procedure Laterality Date  . [place] STUDY N/A 01/08/2016   Procedure: [place] STUDY;  Surgeon: Ladene Artist, MD;  Location: [facility];  Service: Endoscopy;  Laterality: N/A;  . BREAST LUMPECTOMY Left 02/06/11  . CATARACT EXTRACTION, BILATERAL     bilateral caaract removal,  . CORONARY ANGIOPLASTY WITH STENT PLACEMENT     Stent 2007  . CORONARY ARTERY BYPASS GRAFT    . ESOPHAGEAL MANOMETRY N/A 01/08/2016   Procedure: ESOPHAGEAL  MANOMETRY (EM);  Surgeon: Ladene Artist, MD;  Location: [facility];  Service: Endoscopy;  Laterality: N/A;  . open heart surgery     01/19/2010    There were no vitals filed for this visit.      Subjective Assessment - 02/13/16 1044    Subjective I didn't do the decompression exercises.  I have been doing the others   Currently in Pain? Yes   Pain Score 3    Pain Location Shoulder   Pain Orientation Right;Upper   Pain Descriptors / Indicators Aching;Tightness  deep inside,  a band is around my head   Pain Frequency Intermittent   Aggravating Factors  using arms ,  being hugged tighter hurts neck and shoulders   Pain Relieving Factors stretches, manual, medication, heat   Effect of Pain on Daily Activities Not getting as much work done, pain wakes her up with arm asleep   Pain Score 3   Pain Location Back   Pain Orientation Upper   Pain Descriptors / Indicators Tightness;Spasm   Pain Type Chronic pain   Pain Frequency Intermittent   Aggravating Factors  vacume   Pain Relieving Factors resting tylenol, stretching                         [facility] Adult PT Treatment/Exercise - 02/13/16  0001      Neck Exercises: Theraband   Rows 10 reps  3 sets, red band, HEP   Other Theraband Exercises extension 5 X red, HEP, heavy cues     Neck Exercises: Standing   Neck Retraction 5 reps;5 secs  sitting, standing cued during exercises   Other Standing Exercises scapular rows, shoulder exeension red bands 10 x each  HEP with cues     Neck Exercises: Supine   Other Supine Exercise Decompression series,  decompression exercise, shoulder press, head press,  5 x 5 seconds,    Other Supine Exercise supine scapular stabilization, horizontal pulls 10 X, ER 10 X     Moist Heat Therapy   Number Minutes Moist Heat 15 Minutes   Moist Heat Location Cervical  upper back, extra layers     Manual Therapy   Soft tissue mobilization Instrument assist to paraspinals, upper trap                 PT Education - 02/13/16 1217    Education provided Yes   Education Details HEP   Person(s) Educated Patient   Methods Explanation;Demonstration;Tactile cues;Verbal cues;Handout   Comprehension Verbalized understanding;Returned demonstration          PT Short Term Goals - 02/13/16 1219      PT SHORT TERM GOAL #1   Title pt will be I with initial HEP  (12/27/15)   Time 3   Period Weeks   Status Achieved     PT SHORT TERM GOAL #2   Title pt will be able to verbalize and demonstrate techniques to reduce neck pain and headaches via postural awarenss, lifting and carrying mechanics and HEP (12/27/2015)   Baseline able to demonstrate posture awareness in clinic sitting and standing   Time 3   Period Weeks   Status Partially Met           PT Long Term Goals - 02/13/16 1221      PT LONG TERM GOAL #1   Title Pt will improve her FOTO score from 50% limitaion to </= 40% limitation to improve functional mobility.    Baseline 52% limitation (Was 50 % limitation)   Time 6   Period Weeks   Status On-going     PT LONG TERM GOAL #2   Title pt will demonstrate reduced muscle spasm to promote improvement of cervical moblity and reduce pain to </=4/10 ( 12/27/2015)    Baseline spasm less frequent, less intense.     Time 6   Period Weeks   Status On-going     PT LONG TERM GOAL #3   Title pt will increase her cervical moblity by >/= 10 degrees to improve safety with driving and functional mobility. (12/27/15).   Status Unable to assess     PT LONG TERM GOAL #4   Title pt will be able to lift and carry >/=10# at shoulder height or higher with </= 4/10 pain to assist with ADLS (10/29/5036)   Time 6   Period Weeks   Status Achieved               Plan - 02/13/16 1218    Clinical Impression Statement FOTO 52% limitation.  Progress toward HEP Goals.  Her standing posture is improving.     PT Next Visit Plan  Decompression Exercises, Low back  stretches/strengthening, Shoulder exercises as pt tolerates for headaches and right arm pain, bridges  review rows   PT Home Exercise Plan upper trap  stretch, shoulder retraction, cervical flexion/rotation, 11/21: decompression series, 12/19 rows, extension bands standing,  Check lifting mechanics.   Consulted and Agree with Plan of Care Patient      Patient will benefit from skilled therapeutic intervention in order to improve the following deficits and impairments:  Decreased activity tolerance, Impaired perceived functional ability, Postural dysfunction, Improper body mechanics, Decreased strength, Decreased mobility, Impaired UE functional use, Pain  Visit Diagnosis: Neck pain  Muscle spasms of neck  Decreased ROM of intervertebral discs of cervical spine  Abnormal posture  Acute bilateral low back pain without sciatica     Problem List Patient Active Problem List   Diagnosis Date Noted  . Belching 01/03/2016  . Bloating 05/23/2015  . Diarrhea 05/23/2015  . Heartburn 05/23/2015  . Bilateral occipital neuralgia 11/23/2014  . Cervicogenic headache 11/23/2014  . Neck pain 11/23/2014  . OSA on CPAP 11/23/2014  . Generalized anxiety disorder 02/04/2014  . Labile hypertension   . Fibromyalgia   . Cervical spondylolysis 12/24/2013  . CAD (coronary artery disease) 12/24/2013  . Hypertensive emergency 12/23/2013  . Acute encephalopathy 12/23/2013  . Bronchitis, chronic obstructive w acute bronchitis ([place]) 12/21/2013  . Diverticulosis of colon without hemorrhage 11/15/2013  . Hot flashes related to aromatase inhibitor therapy 07/27/2013  . Osteopenia 07/27/2013  . DJD (degenerative joint disease) 09/29/2012  . Breast cancer, Left 12/20/2010  . Leg pain 11/28/2010  . FATTY LIVER DISEASE 01/09/2010  . GASTRITIS 12/16/2007  . Allergic rhinitis 02/18/2007  . DIZZINESS, CHRONIC 02/18/2007  . Headache in back of head 02/18/2007  . Mixed hyperlipidemia 12/23/2006  . GLAUCOMA  12/23/2006  . Coronary atherosclerosis 12/23/2006  . Asthma 12/23/2006  . Gastroesophageal reflux disease 12/23/2006  . IRRITABLE BOWEL SYNDROME 12/23/2006  . Myalgia and myositis 12/23/2006    Markail Diekman  PTA 02/13/2016, 12:23 PM  [facility] [address] [place], [place], [address] Phone: [phone]   Fax:  [phone]  Name: NIAJA STICKLEY MRN: [id number] Date of Birth: February 21, 1936

## 2016-02-13 NOTE — Patient Instructions (Signed)
Issued from ex drawer: sacaplar attached, rows, extension. 5 to 30 x each , 1 x a day  Seated trunk rotation, written instructions.  3-5 x each PRN

## 2016-02-15 ENCOUNTER — Ambulatory Visit: Payer: Medicare Other | Admitting: Physical Therapy

## 2016-02-15 DIAGNOSIS — M545 Low back pain, unspecified: Secondary | ICD-10-CM

## 2016-02-15 DIAGNOSIS — M5382 Other specified dorsopathies, cervical region: Secondary | ICD-10-CM | POA: Diagnosis not present

## 2016-02-15 DIAGNOSIS — R293 Abnormal posture: Secondary | ICD-10-CM | POA: Diagnosis not present

## 2016-02-15 DIAGNOSIS — M542 Cervicalgia: Secondary | ICD-10-CM

## 2016-02-15 DIAGNOSIS — M62838 Other muscle spasm: Secondary | ICD-10-CM | POA: Diagnosis not present

## 2016-02-15 NOTE — Therapy (Signed)
[place], [place], [address] Phone: [phone]   Fax:  [phone]  Physical Therapy Treatment / Progress Note  Patient Details  Name: [name] MRN: [id number] Date of Birth: [birth year] Referring Provider: Ellouise Newer, MD  Encounter Date: 02/15/2016      PT End of Session - 02/15/16 1105    Visit Number 10   Number of Visits 12   Date for PT Re-Evaluation 02/26/15   PT Start Time 1100   PT Stop Time 1155   PT Time Calculation (min) 55 min   Activity Tolerance Patient tolerated treatment well   Behavior During Therapy [facility] for tasks assessed/performed      Past Medical History:  Diagnosis Date  . Asthma   . Atrophic vaginitis   . Breast cancer, Left 12/20/2010   NO BLOOD PRESSURE CHECKS OR STICKS IN LEFT ARM  . CAD (coronary artery disease)    a. 01/19/2010 s/p CABG x 3, [place]a->lad, vg->diag, vg->om1;  b. 06/2011 :Lexi MV: EF 84%, No ischemia. c. 01/06/14 s/p negative nuclear stress test with EF >70%  . Cervical arthritis ([place])   . Colonic polyp 04-27-2009   tubular adenoma  . Diverticulosis of colon (without mention of hemorrhage)   . Fibromyalgia   . Gallstones   . GERD (gastroesophageal reflux disease)   . Glaucoma   . Headache(784.0)    a. frequently assocaited with high BPs  . Hiatal hernia   . Hypercholesterolemia   . Irritable bowel syndrome   . Labile hypertension     Past Surgical History:  Procedure Laterality Date  . [place] STUDY N/A 01/08/2016   Procedure: [place] STUDY;  Surgeon: Ladene Artist, MD;  Location: [facility];  Service: Endoscopy;  Laterality: N/A;  . BREAST LUMPECTOMY Left 02/06/11  . CATARACT EXTRACTION, BILATERAL     bilateral caaract removal,  . CORONARY ANGIOPLASTY WITH STENT PLACEMENT     Stent 2007  . CORONARY ARTERY BYPASS GRAFT    . ESOPHAGEAL MANOMETRY N/A 01/08/2016   Procedure: ESOPHAGEAL MANOMETRY (EM);  Surgeon: Ladene Artist, MD;  Location: [facility];  Service: Endoscopy;  Laterality: N/A;  . open heart surgery     01/19/2010    There were no vitals filed for this visit.      Subjective Assessment - 02/15/16 1102    Subjective "its not as bad today, Im not sure what to do, the TPDN helps but the pain continues to return"            [facility] PT Assessment - 02/15/16 0001      AROM   Cervical - Right Side Bend 30   Cervical - Left Side Bend 22   Cervical - Right Rotation 60   Cervical - Left Rotation 60                     [facility] Adult PT Treatment/Exercise - 02/15/16 0001      Modalities   Modalities Ultrasound     Ultrasound   Ultrasound Location R upper trap   Ultrasound Parameters 81mz at 1.0 w/cm 2 x 8 min,  pt performed upper trap stretch throughout treatment   Ultrasound Goals Pain;Other (Comment)  muscle tightness     Manual Therapy   Manual therapy comments manual trigger point release x 3 along R upper trap and scalenes   Joint Mobilization cervical C3-C7 PA grade 3 mobs (in prone), C2-C7 pA  grade 3, R lateral gapping c3-C6 grade 3 mobs     Neck Exercises: Stretches   Upper Trapezius Stretch 2 reps;30 seconds   Levator Stretch 2 reps;30 seconds                  PT Short Term Goals - February 27, 2016 1319      PT SHORT TERM GOAL #1   Title pt will be I with initial HEP  (12/27/15)   Baseline independent   Period Weeks   Status Achieved     PT SHORT TERM GOAL #2   Title pt will be able to verbalize and demonstrate techniques to reduce neck pain and headaches via postural awarenss, lifting and carrying mechanics and HEP (12/27/2015)   Baseline able to demonstrate posture awareness in clinic sitting and standing   Time 3   Period Weeks   Status Partially Met           PT Long Term Goals - February 27, 2016 1320      PT LONG TERM GOAL #1   Title Pt will improve her FOTO score from 50% limitaion to </= 40% limitation to improve functional mobility.     Baseline 52% limitation (Was 50 % limitation)   Time 6   Period Weeks     PT LONG TERM GOAL #2   Title pt will demonstrate reduced muscle spasm to promote improvement of cervical moblity and reduce pain to </=4/10 ( 12/27/2015)    Baseline spasm less frequent, less intense.     Time 6   Period Weeks   Status On-going     PT LONG TERM GOAL #3   Title pt will increase her cervical moblity by >/= 10 degrees to improve safety with driving and functional mobility. (12/27/15).   Baseline improving, not sure if consistant   Time 6   Period Weeks   Status On-going               Plan - 2016-02-27 1314    Clinical Impression Statement Focused todays session cervical mobs to promote function and assess if the pain is coming from her neck due to little carry over from session to session regarding relief of pain. followig todays session she reported pain dropped to a 2/10 with R rotation.    PT Next Visit Plan assess response to [place],  Decompression Exercises, Low back stretches/strengthening, Shoulder exercises as pt tolerates for headaches and right arm pain, bridges  review rows, cervical mobs,    Consulted and Agree with Plan of Care Patient      Patient will benefit from skilled therapeutic intervention in order to improve the following deficits and impairments:  Decreased activity tolerance, Impaired perceived functional ability, Postural dysfunction, Improper body mechanics, Decreased strength, Decreased mobility, Impaired UE functional use, Pain  Visit Diagnosis: Neck pain  Muscle spasms of neck  Decreased ROM of intervertebral discs of cervical spine  Abnormal posture  Acute bilateral low back pain without sciatica       G-Codes - 2016/02/27 1318    Functional Assessment Tool Used Clinical judgement   Functional Limitation Carrying, moving and handling objects   Carrying, Moving and Handling Objects Current Status (V0350) At least 40 percent but less than 60 percent impaired,  limited or restricted   Carrying, Moving and Handling Objects Goal Status (K9381) At least 40 percent but less than 60 percent impaired, limited or restricted      Problem List Patient Active Problem List   Diagnosis Date Noted  .  Belching 01/03/2016  . Bloating 05/23/2015  . Diarrhea 05/23/2015  . Heartburn 05/23/2015  . Bilateral occipital neuralgia 11/23/2014  . Cervicogenic headache 11/23/2014  . Neck pain 11/23/2014  . OSA on CPAP 11/23/2014  . Generalized anxiety disorder 02/04/2014  . Labile hypertension   . Fibromyalgia   . Cervical spondylolysis 12/24/2013  . CAD (coronary artery disease) 12/24/2013  . Hypertensive emergency 12/23/2013  . Acute encephalopathy 12/23/2013  . Bronchitis, chronic obstructive w acute bronchitis ([place]) 12/21/2013  . Diverticulosis of colon without hemorrhage 11/15/2013  . Hot flashes related to aromatase inhibitor therapy 07/27/2013  . Osteopenia 07/27/2013  . DJD (degenerative joint disease) 09/29/2012  . Breast cancer, Left 12/20/2010  . Leg pain 11/28/2010  . FATTY LIVER DISEASE 01/09/2010  . GASTRITIS 12/16/2007  . Allergic rhinitis 02/18/2007  . DIZZINESS, CHRONIC 02/18/2007  . Headache in back of head 02/18/2007  . Mixed hyperlipidemia 12/23/2006  . GLAUCOMA 12/23/2006  . Coronary atherosclerosis 12/23/2006  . Asthma 12/23/2006  . Gastroesophageal reflux disease 12/23/2006  . IRRITABLE BOWEL SYNDROME 12/23/2006  . Myalgia and myositis 12/23/2006   Starr Lake PT, DPT, LAT, ATC  02/15/16  1:21 PM      [facility] [address] [place], [place], [address] Phone: [phone]   Fax:  [phone]  Name: [name] MRN: [id number] Date of Birth: 11-Nov-1935

## 2016-02-16 MED FILL — LETROZOLE 2.5 MG TABLET: 2.5 | 90 days supply | Qty: 90 | Fill #1

## 2016-02-21 ENCOUNTER — Ambulatory Visit: Payer: Medicare Other | Admitting: Physical Therapy

## 2016-02-21 DIAGNOSIS — M62838 Other muscle spasm: Secondary | ICD-10-CM

## 2016-02-21 DIAGNOSIS — R293 Abnormal posture: Secondary | ICD-10-CM | POA: Diagnosis not present

## 2016-02-21 DIAGNOSIS — M545 Low back pain, unspecified: Secondary | ICD-10-CM

## 2016-02-21 DIAGNOSIS — M5382 Other specified dorsopathies, cervical region: Secondary | ICD-10-CM | POA: Diagnosis not present

## 2016-02-21 DIAGNOSIS — M542 Cervicalgia: Secondary | ICD-10-CM

## 2016-02-21 NOTE — Therapy (Addendum)
[place], [place], [address] Phone: [phone]   Fax:  [phone]  Physical Therapy Treatment / Discharge Note  Patient Details  Name: [name] MRN: [id number] Date of Birth: October 08, 1935 Referring Provider: Ellouise Newer, MD  Encounter Date: 02/21/2016      PT End of Session - 02/21/16 1216    Visit Number 11   Number of Visits 12   Date for PT Re-Evaluation 02/26/15   PT Start Time 5885   PT Stop Time 1117   PT Time Calculation (min) 62 min   Behavior During Therapy [facility] for tasks assessed/performed      Past Medical History:  Diagnosis Date  . Asthma   . Atrophic vaginitis   . Breast cancer, Left 12/20/2010   NO BLOOD PRESSURE CHECKS OR STICKS IN LEFT ARM  . CAD (coronary artery disease)    a. 01/19/2010 s/p CABG x 3, lima->lad, vg->diag, vg->om1;  b. 06/2011 :Lexi MV: EF 84%, No ischemia. c. 01/06/14 s/p negative nuclear stress test with EF >70%  . Cervical arthritis ([place])   . Colonic polyp 04-27-2009   tubular adenoma  . Diverticulosis of colon (without mention of hemorrhage)   . Fibromyalgia   . Gallstones   . GERD (gastroesophageal reflux disease)   . Glaucoma   . Headache(784.0)    a. frequently assocaited with high BPs  . Hiatal hernia   . Hypercholesterolemia   . Irritable bowel syndrome   . Labile hypertension     Past Surgical History:  Procedure Laterality Date  . [place] STUDY N/A 01/08/2016   Procedure: [place] STUDY;  Surgeon: Ladene Artist, MD;  Location: [facility];  Service: Endoscopy;  Laterality: N/A;  . BREAST LUMPECTOMY Left 02/06/11  . CATARACT EXTRACTION, BILATERAL     bilateral caaract removal,  . CORONARY ANGIOPLASTY WITH STENT PLACEMENT     Stent 2007  . CORONARY ARTERY BYPASS GRAFT    . ESOPHAGEAL MANOMETRY N/A 01/08/2016   Procedure: ESOPHAGEAL MANOMETRY (EM);  Surgeon: Ladene Artist, MD;  Location: [facility];  Service: Endoscopy;   Laterality: N/A;  . open heart surgery     01/19/2010    There were no vitals filed for this visit.      Subjective Assessment - 02/21/16 1019    Subjective The last treatment helped, my pain is mild.  My neck is stiff   Currently in Pain? Yes   Pain Score --  mild   Pain Location Shoulder   Pain Orientation Right;Upper   Pain Descriptors / Indicators Aching;Tightness   Pain Type Chronic pain   Pain Frequency Intermittent   Aggravating Factors  using arms,  cold weather, slumping   Pain Relieving Factors [place],  Pt,  working posture,  showing everyone my necklace.   Pain Score 4   Pain Location Back   Pain Orientation Upper;Mid;Lower   Pain Descriptors / Indicators Spasm;Tightness  it will catch   Pain Type Chronic pain   Pain Radiating Towards whole back   Pain Frequency Intermittent   Aggravating Factors  sitting in big chairs(Visiting people)   Pain Relieving Factors change of position, sitting on the edge of the chairs.                          [facility] Adult PT Treatment/Exercise - 02/21/16 0001      Neck Exercises: Seated   Neck Retraction 3 secs;5  reps   Cervical Rotation 5 reps   Cervical Rotation Limitations cued for posture   Other Seated Exercise scapular retraction3 X 10 seconds each     Moist Heat Therapy   Number Minutes Moist Heat 15 Minutes   Moist Heat Location Cervical  upper back     Ultrasound   Ultrasound Location right upper trap   Ultrasound Parameters 25mz1 watts/cm2   Ultrasound Goals Pain;Other (Comment)  muscle tightness     Manual Therapy   Manual therapy comments soft tissue work,  upper trap, upper back right> left     Neck Exercises: Stretches   Upper Trapezius Stretch 3 reps;10 seconds   Levator Stretch 3 reps;10 seconds  Both sides                PT Education - 02/21/16 1206    Education provided Yes   Education Details Benifits of adhering to her HEP   Person(s) Educated Patient   Methods  Explanation   Comprehension Verbalized understanding          PT Short Term Goals - 02/21/16 1207      PT SHORT TERM GOAL #1   Title pt will be I with initial HEP  (12/27/15)   Baseline independent   Time 3   Period Weeks   Status Achieved     PT SHORT TERM GOAL #2   Title pt will be able to verbalize and demonstrate techniques to reduce neck pain and headaches via postural awarenss, lifting and carrying mechanics and HEP (12/27/2015)   Baseline able to demonstrate posture awareness in clinic sitting and standing.  she notes she tries to use good mechanics at home,  She was able to verbalize ways to modify chair to help assist her posture.  She is able to get close to the load before she lifts or carries an item  Not completely compliant.   Time 3   Period Weeks   Status Achieved           PT Long Term Goals - 02/21/16 1211      PT LONG TERM GOAL #1   Title Pt will improve her FOTO score from 50% limitaion to </= 40% limitation to improve functional mobility.    Baseline 52% limitation (Was 50 % limitation)  Unsure if she understood the questions because she has clearly improved functionally since PT started.    Time 6   Period Weeks   Status Not Met     PT LONG TERM GOAL #2   Title pt will demonstrate reduced muscle spasm to promote improvement of cervical moblity and reduce pain to </=4/10 ( 12/27/2015)    Baseline pain intermittantly more than 4/10 , with spikes to moderate pain.  Pain usually 4/10 or less.    Time 6   Period Weeks   Status Partially Met     PT LONG TERM GOAL #3   Title pt will increase her cervical moblity by >/= 10 degrees to improve safety with driving and functional mobility. (12/27/15).   Baseline 60 + AROM rotation rt and Lt   Time 6   Period Weeks   Status Achieved     PT LONG TERM GOAL #4   Title pt will be able to lift and carry >/=10# at shoulder height or higher with </= 4/10 pain to assist with ADLS (26/03/7033   Time 6   Period Weeks    Status Achieved  G-Codes - 02/23/16 1208    Functional Assessment Tool Used Clinical judgement   Functional Limitation Carrying, moving and handling objects   Carrying, Moving and Handling Objects Goal Status [phone]) At least 40 percent but less than 60 percent impaired, limited or restricted   Carrying, Moving and Handling Objects Discharge Status [phone]) At least 20 percent but less than 40 percent impaired, limited or restricted               Plan - 02/21/16 1217    Clinical Impression Statement ROM rotations less painful post session today.  ROM appears increased since last visit  she did not let me know she would cancel her next appointment so ROM npot formally measured.  STG#2 partially met, LTG's # 1 not met, LTG#2 partially met, LTG#3 met.   Patient had no questions about her HEP.  She admits to not being compliant with her HEP.  Pain and ROM over have imprived greatly.  Pain improves whren she uses good posture techniques. .     PT Next Visit Plan Discharge at patient's request.  She is pleased with her results.     PT Home Exercise Plan upper trap stretch, shoulder retraction, cervical flexion/rotation, 11/21: decompression series, 12/19 rows, extension bands standing,  Check lifting mechanics.   Consulted and Agree with Plan of Care Patient      Patient will benefit from skilled therapeutic intervention in order to improve the following deficits and impairments:     Visit Diagnosis: Neck pain  Muscle spasms of neck  Decreased ROM of intervertebral discs of cervical spine  Abnormal posture  Acute bilateral low back pain without sciatica     Problem List Patient Active Problem List   Diagnosis Date Noted  . Belching 01/03/2016  . Bloating 05/23/2015  . Diarrhea 05/23/2015  . Heartburn 05/23/2015  . Bilateral occipital neuralgia 11/23/2014  . Cervicogenic headache 11/23/2014  . Neck pain 11/23/2014  . OSA on CPAP 11/23/2014  . Generalized  anxiety disorder 02/04/2014  . Labile hypertension   . Fibromyalgia   . Cervical spondylolysis 12/24/2013  . CAD (coronary artery disease) 12/24/2013  . Hypertensive emergency 12/23/2013  . Acute encephalopathy 12/23/2013  . Bronchitis, chronic obstructive w acute bronchitis ([place]) 12/21/2013  . Diverticulosis of colon without hemorrhage 11/15/2013  . Hot flashes related to aromatase inhibitor therapy 07/27/2013  . Osteopenia 07/27/2013  . DJD (degenerative joint disease) 09/29/2012  . Breast cancer, Left 12/20/2010  . Leg pain 11/28/2010  . FATTY LIVER DISEASE 01/09/2010  . GASTRITIS 12/16/2007  . Allergic rhinitis 02/18/2007  . DIZZINESS, CHRONIC 02/18/2007  . Headache in back of head 02/18/2007  . Mixed hyperlipidemia 12/23/2006  . GLAUCOMA 12/23/2006  . Coronary atherosclerosis 12/23/2006  . Asthma 12/23/2006  . Gastroesophageal reflux disease 12/23/2006  . IRRITABLE BOWEL SYNDROME 12/23/2006  . Myalgia and myositis 12/23/2006    Eyla Tallon PTA 02/21/2016, 12:24 PM  [facility] [address] [place], [place], [address] Phone: [phone]   Fax:  [phone]  Name: BRADI ARBUTHNOT MRN: [id number] Date of Birth: 1935-10-19   PHYSICAL THERAPY DISCHARGE SUMMARY  Visits from Start of Care: 11  Current functional level related to goals / functional outcomes: See goals   Remaining deficits: Intermittent tightness in the R upper trap with intermittent incidence of HA.    Education / Equipment: HEP, posture, lifting/ carrying mechanics, theraband  Plan: Patient agrees to discharge.  Patient goals were partially met. Patient is being discharged due  to being pleased with the current functional level.  ?????    Kristoffer Leamon PT, DPT, LAT, ATC  02/23/16  11:58 AM

## 2016-02-22 ENCOUNTER — Ambulatory Visit: Payer: Medicare Other | Admitting: Physical Therapy

## 2016-03-10 DIAGNOSIS — H01111 Allergic dermatitis of right upper eyelid: Secondary | ICD-10-CM | POA: Diagnosis not present

## 2016-03-19 ENCOUNTER — Encounter: Payer: Self-pay | Admitting: Gastroenterology

## 2016-03-19 ENCOUNTER — Ambulatory Visit (INDEPENDENT_AMBULATORY_CARE_PROVIDER_SITE_OTHER): Payer: Medicare Other | Admitting: Gastroenterology

## 2016-03-19 VITALS — BP 122/60 | HR 56 | Ht <= 58 in | Wt 137.4 lb

## 2016-03-19 DIAGNOSIS — K229 Disease of esophagus, unspecified: Secondary | ICD-10-CM | POA: Diagnosis not present

## 2016-03-19 DIAGNOSIS — K219 Gastro-esophageal reflux disease without esophagitis: Secondary | ICD-10-CM | POA: Diagnosis not present

## 2016-03-19 DIAGNOSIS — K588 Other irritable bowel syndrome: Secondary | ICD-10-CM | POA: Diagnosis not present

## 2016-03-19 DIAGNOSIS — R1310 Dysphagia, unspecified: Secondary | ICD-10-CM

## 2016-03-19 DIAGNOSIS — R1319 Other dysphagia: Secondary | ICD-10-CM

## 2016-03-19 DIAGNOSIS — J301 Allergic rhinitis due to pollen: Secondary | ICD-10-CM | POA: Diagnosis not present

## 2016-03-19 DIAGNOSIS — K224 Dyskinesia of esophagus: Secondary | ICD-10-CM

## 2016-03-19 DIAGNOSIS — J3089 Other allergic rhinitis: Secondary | ICD-10-CM | POA: Diagnosis not present

## 2016-03-19 NOTE — Progress Notes (Signed)
History of Present Illness: This is a 81 year old female returning for follow-up of intermittent chest pain, epigastric pain and intermittent dysphagia. She has had an extensive evaluation to date including EGD in May 2016 was normal, colonoscopy in May 2016 showed diverticulosis and 2 small adenomatous colon polyps, abd [place] in 11/2015 was normal, esophageal manometry study in November 2017 showed a hypercontractile esophagus and a 24-hour pH study and November 2017 done on PPIs with a DeMeester score of 4.7. She was unable to afford Levsin so did not try it. Dicyclomine did not improve symptoms. IBgard helps with intermittent IBS symptoms.  Allergies  Allergen Reactions  . Choline Fenofibrate Other (See Comments)     pt states INTOL to Trilipix w/ "thigh burning"  . Simvastatin Other (See Comments)     pt states INTOL to STATINS \\T \ refuses to restart  . Bentyl [Dicyclomine Hcl]     Made me feel weird and drained me  . Hctz [Hydrochlorothiazide]     Causes hyponatremia  . Hydrocodone     Nightmare after taking cough syrup w/hydrocodone  . Levaquin [Levofloxacin In D5w]     Elevated BP  . Adhesive [Tape] Rash  . Ceclor [Cefaclor] Rash  . Clarithromycin Rash  . Codeine Nausea Only  . Doxycycline Rash  . Lisinopril Cough    Developed ACE cough...  . Penicillins Itching and Rash    At injection site  . Tobramycin-Dexamethasone Rash   Outpatient Medications Prior to Visit  Medication Sig Dispense Refill  . Acetaminophen (TYLENOL 8 HOUR PO) Take by mouth as directed.     [name] albuterol (VENTOLIN HFA) 108 (90 Base) MCG/ACT inhaler Inhale 2 puffs into the lungs every 6 (six) hours as needed for wheezing or shortness of breath.    [name] amLODipine (NORVASC) 5 MG tablet take 1 tablet by mouth once daily 30 tablet 4  . aspirin EC 81 MG tablet Take 81 mg by mouth daily.    [name] dicyclomine (BENTYL) 10 MG capsule Take 1 capsule (10 mg total) by mouth 3 (three) times daily before meals. 90 capsule 3    . fexofenadine (ALLEGRA) 180 MG tablet Take 180 mg by mouth daily.      . fluocinonide (LIDEX) 0.05 % external solution Apply 1 application topically daily as needed (scalp itching).     . Glucosamine-Chondroit-Vit C-Mn (GLUCOSAMINE CHONDR 1500 COMPLX PO) Take 1 tablet by mouth 3 (three) times daily.      . hydrALAZINE (APRESOLINE) 50 MG tablet take 1/2 tablet by mouth three times a day 45 tablet 2  . ketoconazole (NIZORAL) 2 % shampoo Apply 1 application topically 2 (two) times a week. As needed for itching a couple of times week    . latanoprost (XALATAN) 0.005 % ophthalmic solution Place 1 drop into both eyes at bedtime.     [name] letrozole (FEMARA) 2.5 MG tablet Take 1 tablet (2.5 mg total) by mouth daily. 90 tablet 3  . losartan (COZAAR) 100 MG tablet take 1/2 tablet by mouth three times a day 45 tablet 5  . magnesium oxide (MAG-OX) 400 MG tablet Take 400 mg by mouth daily. Reported on 02/14/2015    . metoprolol (LOPRESSOR) 50 MG tablet take 1 tablet by mouth three times a day 270 tablet 2  . mometasone (NASONEX) 50 MCG/ACT nasal spray Place 2 sprays into the nose daily as needed (congestion).     . nitroGLYCERIN (NITROSTAT) 0.4 MG SL tablet Place 1 tablet (0.4 mg total) under the  tongue every 5 (five) minutes as needed for chest pain. 25 tablet 3  . Omega-3 Fatty Acids (FISH OIL) 1200 MG CAPS Take 1,200 mg by mouth daily. Reported on 02/14/2015    . pantoprazole (PROTONIX) 40 MG tablet Take 1 tablet (40 mg total) by mouth daily. 30 tablet 6  . Psyllium (METAMUCIL FIBER PO) Take by mouth.    . ranitidine (ZANTAC) 300 MG capsule Take 1 capsule (300 mg total) by mouth every evening. 30 capsule 3  . sucralfate (CARAFATE) 1 g tablet Take 1 tablet (1 g total) by mouth 4 (four) times daily. 60 tablet 0  . vitamin B-12 (CYANOCOBALAMIN) 1000 MCG tablet Take 1,000 mcg by mouth daily.      . hyoscyamine (ANASPAZ) 0.125 MG TBDP disintergrating tablet Place 1 tablet (0.125 mg total) under the tongue every 6  (six) hours as needed for bladder spasms or cramping. 30 tablet 3   No facility-administered medications prior to visit.    Past Medical History:  Diagnosis Date  . Asthma   . Atrophic vaginitis   . Breast cancer, Left 12/20/2010   NO BLOOD PRESSURE CHECKS OR STICKS IN LEFT ARM  . CAD (coronary artery disease)    a. 01/19/2010 s/p CABG x 3, lima->lad, vg->diag, vg->om1;  b. 06/2011 :Lexi MV: EF 84%, No ischemia. c. 01/06/14 s/p negative nuclear stress test with EF >70%  . Cervical arthritis ([place])   . Colonic polyp 04-27-2009   tubular adenoma  . Diverticulosis of colon (without mention of hemorrhage)   . Fibromyalgia   . Gallstones   . GERD (gastroesophageal reflux disease)   . Glaucoma   . Headache(784.0)    a. frequently assocaited with high BPs  . Hiatal hernia   . Hypercholesterolemia   . Irritable bowel syndrome   . Labile hypertension    Past Surgical History:  Procedure Laterality Date  . [place] STUDY N/A 01/08/2016   Procedure: [place] STUDY;  Surgeon: Ladene Artist, MD;  Location: [facility];  Service: Endoscopy;  Laterality: N/A;  . BREAST LUMPECTOMY Left 02/06/11  . CATARACT EXTRACTION, BILATERAL     bilateral caaract removal,  . CORONARY ANGIOPLASTY WITH STENT PLACEMENT     Stent 2007  . CORONARY ARTERY BYPASS GRAFT    . ESOPHAGEAL MANOMETRY N/A 01/08/2016   Procedure: ESOPHAGEAL MANOMETRY (EM);  Surgeon: Ladene Artist, MD;  Location: [facility];  Service: Endoscopy;  Laterality: N/A;  . open heart surgery     01/19/2010   Social History   Social History  . Marital status: Married    Spouse name: Gildardo Griffes. [place]  . Number of children: 2  . Years of education: 12   Occupational History  . part time preschool teacher-retired [employer]   Social History Main Topics  . Smoking status: Never Smoker  . Smokeless tobacco: Never Used  . Alcohol use No  . Drug use: No  . Sexual activity: Yes    Birth control/ protection:  Post-menopausal   Other Topics Concern  . None   Social History Narrative   Patient lives at home with her husband Marcello Moores). Patient is retired. Patient  Has 12 th grade education.    Caffeine- sometimes- One cup of coffee.   Right handed.   Four granddaughters.   Family History  Problem Relation Age of Onset  . Stroke Mother   . Stroke Father   . Prostate cancer Father   . Breast cancer Sister   .  Diabetes Sister   . Cancer Brother     gland cancer  . Heart disease Brother   . Colon cancer Neg Hx   . Stomach cancer Neg Hx   . Pancreatic cancer Neg Hx   . Kidney disease Neg Hx   . Liver disease Neg Hx      Physical Exam: General: Well developed, well nourished, no acute distress Head: Normocephalic and atraumatic Eyes:  sclerae anicteric, EOMI Ears: Normal auditory acuity Mouth: No deformity or lesions Lungs: Clear throughout to auscultation Heart: Regular rate and rhythm; no murmurs, rubs or bruits Abdomen: Soft, non tender and non distended. No masses, hepatosplenomegaly or hernias noted. Normal Bowel sounds Musculoskeletal: Symmetrical with no gross deformities  Pulses:  Normal pulses noted Extremities: No clubbing, cyanosis, edema or deformities noted Neurological: Alert oriented x 4, grossly nonfocal Psychological:  Alert and cooperative. Normal mood and affect  Assessment and Recommendations:  1. Hypercontractile esophagus with intermittient dysphagia, epigastric pain and chest pain. Evaluation to date as summarized in HPI. Could not afford Levsin. Dicyclomine did not help. DC dicyclomine and Carafate. Trial of FD tid prn. NTG SL for severe symptoms. We had an extensive discussion about her symptoms and her evaluation to date. It is likely that her dysphasia, chest pain and epigastric pain are related to her hypercontractile esophagus. If her symptoms worsen consider trial of diltiazem or phosphodiesterase inhibitor however using either of these medications would  require management input from her cardiologist and PCP.  2. GERD. Well controlled on current mediations as noted on 24 hr pH study. Continue current medications. REV in 1 year.   3. IBS and gas. IBgard tid prn.   I spent 25 minutes of face-to-face time with the patient. Greater than 50% of the time was spent counseling and coordinating care.

## 2016-03-19 NOTE — Patient Instructions (Signed)
Start the samples of FD Guard 2 capsules by mouth twice daily. You can purchase more over the counter.   Discontinue your Carafate and Bentyl.   Thank you for choosing me and [facility].  Pricilla Riffle. Dagoberto Ligas., MD., Marval Regal

## 2016-04-02 DIAGNOSIS — J3089 Other allergic rhinitis: Secondary | ICD-10-CM | POA: Diagnosis not present

## 2016-04-02 DIAGNOSIS — J301 Allergic rhinitis due to pollen: Secondary | ICD-10-CM | POA: Diagnosis not present

## 2016-04-08 NOTE — Progress Notes (Signed)
Patient ID: [name], female   DOB: 07-12-1935, 81 y.o.   MRN: [id number]   [name] is a 81 y.o. [name] female with a history of CAD, CABG 2011, HTN, HLD, HA, fibromyalgia, IBS, and GERD  HTN well Rx tends to be hyponatremic with diuretics  She was seen in hospital 12/2013  With r/o normal lexiscan myovue and echo with normal EF  Started on amlodipine November 2016  She is intolerant to statins and refuses to try new ones   Had injection neck for headaches and is seeing PT  Having GI issues GERD/Reflux ? biliary  colic   ROS: Denies fever, malais, weight loss, blurry vision, decreased visual acuity, cough, sputum, SOB, hemoptysis, pleuritic pain, palpitaitons, heartburn, abdominal pain, melena, lower extremity edema, claudication, or rash.  All other systems reviewed and negative  General: Affect appropriate Healthy:  appears stated age 3: normal Neck supple with no adenopathy JVP normal no bruits no thyromegaly Lungs clear with no wheezing and good diaphragmatic motion Heart:  S1/S2 no murmur, no rub, gallop or click PMI normal Abdomen: benighn, BS positve, no tenderness, no AAA no bruit.  No HSM or HJR Distal pulses intact with no bruits No edema Neuro non-focal Skin warm and dry No muscular weakness   Current Outpatient Prescriptions  Medication Sig Dispense Refill  . Acetaminophen (TYLENOL 8 HOUR PO) Take by mouth as directed.     [name] albuterol (VENTOLIN HFA) 108 (90 Base) MCG/ACT inhaler Inhale 2 puffs into the lungs every 6 (six) hours as needed for wheezing or shortness of breath.    [name] amLODipine (NORVASC) 5 MG tablet Take 1 tablet (5 mg total) by mouth daily. 90 tablet 3  . aspirin EC 81 MG tablet Take 81 mg by mouth daily.    . Calcium & Magnesium Carbonates (MYLANTA PO) Take by mouth. 1-2 teaspoons as needed    . fexofenadine (ALLEGRA) 180 MG tablet Take 180 mg by mouth daily.      . fluocinonide (LIDEX) 0.05 % external solution Apply 1  application topically daily as needed (scalp itching).     . Glucosamine-Chondroit-Vit C-Mn (GLUCOSAMINE CHONDR 1500 COMPLX PO) Take 1 tablet by mouth 3 (three) times daily.      . hydrALAZINE (APRESOLINE) 50 MG tablet take 1/2 tablet by mouth three times a day 45 tablet 2  . ketoconazole (NIZORAL) 2 % shampoo Apply 1 application topically 2 (two) times a week. As needed for itching a couple of times week    . latanoprost (XALATAN) 0.005 % ophthalmic solution Place 1 drop into both eyes at bedtime.     [name] losartan (COZAAR) 100 MG tablet take 1/2 tablet by mouth three times a day 45 tablet 5  . magnesium oxide (MAG-OX) 400 MG tablet Take 400 mg by mouth daily. Reported on 02/14/2015    . metoprolol (LOPRESSOR) 50 MG tablet take 1 tablet by mouth three times a day 270 tablet 2  . mometasone (NASONEX) 50 MCG/ACT nasal spray Place 2 sprays into the nose daily as needed (congestion).     . nitroGLYCERIN (NITROSTAT) 0.4 MG SL tablet Place 1 tablet (0.4 mg total) under the tongue every 5 (five) minutes as needed for chest pain. 25 tablet 2  . Omega-3 Fatty Acids (FISH OIL) 1200 MG CAPS Take 1,200 mg by mouth daily. Reported on 02/14/2015    . pantoprazole (PROTONIX) 40 MG tablet Take 1 tablet (40 mg total) by mouth daily. 30 tablet 6  . Psyllium (  METAMUCIL FIBER PO) Take 1 tablet by mouth daily.     . ranitidine (ZANTAC) 300 MG capsule Take 1 capsule (300 mg total) by mouth every evening. 30 capsule 3  . sucralfate (CARAFATE) 1 g tablet Take 0.5 g by mouth 4 (four) times daily as needed.    . vitamin B-12 (CYANOCOBALAMIN) 1000 MCG tablet Take 1,000 mcg by mouth daily.       No current facility-administered medications for this visit.     Allergies  Choline fenofibrate; Simvastatin; Bentyl [dicyclomine hcl]; Hctz [hydrochlorothiazide]; Hydrocodone; Levaquin [levofloxacin in d5w]; Adhesive [tape]; Ceclor [cefaclor]; Clarithromycin; Codeine; Doxycycline; Lisinopril; Penicillins; and  Tobramycin-dexamethasone  Electrocardiogram:  SR rate 64  Normal LAE 01/14/14  10/11/15 SR rate 59 LVH no change   Assessment and Plan  CAD/CABG: 2011  non ishcemic myovue 2015  She will call if has to take nitro continue ASA/Beta blocker  HTN:  Improved on amlodipine   Breast Cancer   F/U Mag[place]at No BP's on left arm  mamogram last year ok  Chol:   Lab Results  Component Value Date   LDLCALC 110 (H) 04/06/2012   Labs with Dr Ree Kida to statin    Headaches:  Seen by neurology cervicogenic ? Nerve block injection x1 continue PT gabapentin has been d/c   GI:  ? Biliary colic and reflux having HIDA scan in am f/u Dr Fuller Plan   F/U me 6 months  Jenkins Rouge

## 2016-04-09 ENCOUNTER — Other Ambulatory Visit: Payer: Self-pay | Admitting: Pulmonary Disease

## 2016-04-11 ENCOUNTER — Encounter: Payer: Self-pay | Admitting: Gastroenterology

## 2016-04-11 ENCOUNTER — Ambulatory Visit (INDEPENDENT_AMBULATORY_CARE_PROVIDER_SITE_OTHER): Payer: Medicare Other | Admitting: Gastroenterology

## 2016-04-11 VITALS — BP 120/62 | HR 72 | Ht <= 58 in | Wt 136.8 lb

## 2016-04-11 DIAGNOSIS — R110 Nausea: Secondary | ICD-10-CM

## 2016-04-11 DIAGNOSIS — R1013 Epigastric pain: Secondary | ICD-10-CM | POA: Diagnosis not present

## 2016-04-11 MED ORDER — SUCRALFATE 1 GM/10ML PO SUSP: 1.0000 g | Freq: Three times a day (TID) | ORAL | 2 refills | Status: DC

## 2016-04-11 NOTE — Progress Notes (Signed)
04/11/2016 [name] STROJNY [id number] 1935-11-17   HISTORY OF PRESENT ILLNESS:  This is an 81 year-old female with ongoing complaints of upper abdominal pain and nausea. She is known to Dr. Fuller Plan and was just seen by him a few weeks ago.  She has undergone extensive GI evaluation. Her reflux seems well controlled on PPI according to pH study.  She has tried several GI medications.  Patient is very distressed by her symptoms. She says that since October she has had intermittent epigastric abdominal pain and nausea. Sometimes the pain is very severe.  She is not able to afford Levsin. Dicyclomine made her feel weird. She is on daily PPI. She uses Carafate tablet intermittently. Takes ranitidine 300 mg at bedtime.  Past Medical History:  Diagnosis Date  . Asthma   . Atrophic vaginitis   . Breast cancer, Left 12/20/2010   NO BLOOD PRESSURE CHECKS OR STICKS IN LEFT ARM  . CAD (coronary artery disease)    a. 01/19/2010 s/p CABG x 3, lima->lad, vg->diag, vg->om1;  b. 06/2011 :Lexi MV: EF 84%, No ischemia. c. 01/06/14 s/p negative nuclear stress test with EF >70%  . Cervical arthritis ([place])   . Colonic polyp 04-27-2009   tubular adenoma  . Diverticulosis of colon (without mention of hemorrhage)   . Fibromyalgia   . Gallstones   . GERD (gastroesophageal reflux disease)   . Glaucoma   . Headache(784.0)    a. frequently assocaited with high BPs  . Hiatal hernia   . Hypercholesterolemia   . Irritable bowel syndrome   . Labile hypertension    Past Surgical History:  Procedure Laterality Date  . [place] STUDY N/A 01/08/2016   Procedure: [place] STUDY;  Surgeon: Ladene Artist, MD;  Location: [facility];  Service: Endoscopy;  Laterality: N/A;  . BREAST LUMPECTOMY Left 02/06/11  . CATARACT EXTRACTION, BILATERAL     bilateral caaract removal,  . CORONARY ANGIOPLASTY WITH STENT PLACEMENT     Stent 2007  . CORONARY ARTERY BYPASS GRAFT    . ESOPHAGEAL MANOMETRY N/A 01/08/2016   Procedure: ESOPHAGEAL MANOMETRY (EM);  Surgeon: Ladene Artist, MD;  Location: [facility];  Service: Endoscopy;  Laterality: N/A;  . open heart surgery     01/19/2010    reports that she has never smoked. She has never used smokeless tobacco. She reports that she does not drink alcohol or use drugs. family history includes Breast cancer in her sister; Cancer in her brother; Diabetes in her sister; Heart disease in her brother; Prostate cancer in her father; Stroke in her father and mother. Allergies  Allergen Reactions  . Choline Fenofibrate Other (See Comments)     pt states INTOL to Trilipix w/ "thigh burning"  . Simvastatin Other (See Comments)     pt states INTOL to STATINS \\T \ refuses to restart  . Bentyl [Dicyclomine Hcl]     Made me feel weird and drained me  . Hctz [Hydrochlorothiazide]     Causes hyponatremia  . Hydrocodone     Nightmare after taking cough syrup w/hydrocodone  . Levaquin [Levofloxacin In D5w]     Elevated BP  . Adhesive [Tape] Rash  . Ceclor [Cefaclor] Rash  . Clarithromycin Rash  . Codeine Nausea Only  . Doxycycline Rash  . Lisinopril Cough    Developed ACE cough...  . Penicillins Itching and Rash    At injection site  . Tobramycin-Dexamethasone Rash      Outpatient Encounter Prescriptions  as of 04/11/2016  Medication Sig  . Acetaminophen (TYLENOL 8 HOUR PO) Take by mouth as directed.   [name] albuterol (VENTOLIN HFA) 108 (90 Base) MCG/ACT inhaler Inhale 2 puffs into the lungs every 6 (six) hours as needed for wheezing or shortness of breath.  [name] amLODipine (NORVASC) 5 MG tablet take 1 tablet by mouth once daily  . aspirin EC 81 MG tablet Take 81 mg by mouth daily.  . Calcium & Magnesium Carbonates (MYLANTA PO) Take by mouth. 1-2 teaspoons as needed  . fexofenadine (ALLEGRA) 180 MG tablet Take 180 mg by mouth daily.    . fluocinonide (LIDEX) 0.05 % external solution Apply 1 application topically daily as needed (scalp itching).   .  Glucosamine-Chondroit-Vit C-Mn (GLUCOSAMINE CHONDR 1500 COMPLX PO) Take 1 tablet by mouth 3 (three) times daily.    . hydrALAZINE (APRESOLINE) 50 MG tablet take 1/2 tablet by mouth three times a day  . ketoconazole (NIZORAL) 2 % shampoo Apply 1 application topically 2 (two) times a week. As needed for itching a couple of times week  . latanoprost (XALATAN) 0.005 % ophthalmic solution Place 1 drop into both eyes at bedtime.   [name] losartan (COZAAR) 100 MG tablet take 1/2 tablet by mouth three times a day  . magnesium oxide (MAG-OX) 400 MG tablet Take 400 mg by mouth daily. Reported on 02/14/2015  . metoprolol (LOPRESSOR) 50 MG tablet take 1 tablet by mouth three times a day  . mometasone (NASONEX) 50 MCG/ACT nasal spray Place 2 sprays into the nose daily as needed (congestion).   . nitroGLYCERIN (NITROSTAT) 0.4 MG SL tablet Place 1 tablet (0.4 mg total) under the tongue every 5 (five) minutes as needed for chest pain.  . Omega-3 Fatty Acids (FISH OIL) 1200 MG CAPS Take 1,200 mg by mouth daily. Reported on 02/14/2015  . pantoprazole (PROTONIX) 40 MG tablet Take 1 tablet (40 mg total) by mouth daily.  . Psyllium (METAMUCIL FIBER PO) Take by mouth.  . ranitidine (ZANTAC) 300 MG capsule Take 1 capsule (300 mg total) by mouth every evening.  . sucralfate (CARAFATE) 1 g tablet Take 0.5 g by mouth 4 (four) times daily as needed.  . vitamin B-12 (CYANOCOBALAMIN) 1000 MCG tablet Take 1,000 mcg by mouth daily.    . sucralfate (CARAFATE) 1 GM/10ML suspension Take 10 mLs (1 g total) by mouth 4 (four) times daily -  with meals and at bedtime.  . [DISCONTINUED] letrozole (FEMARA) 2.5 MG tablet Take 1 tablet (2.5 mg total) by mouth daily. (Patient not taking: Reported on 04/11/2016)  . [DISCONTINUED] losartan (COZAAR) 100 MG tablet take 1/2 tablet by mouth three times a day   No facility-administered encounter medications on file as of 04/11/2016.      REVIEW OF SYSTEMS  : All other systems reviewed and negative  except where noted in the History of Present Illness.   PHYSICAL EXAM: BP 120/62   Pulse 72   Ht 4\' 10"  (1.473 m)   Wt 136 lb 12.8 oz (62.1 kg)   LMP 11/26/1990   BMI 28.59 kg/m  General: Well developed white female in no acute distress Head: Normocephalic and atraumatic Eyes:  Sclerae anicteric, conjunctiva pink. Ears: Normal auditory acuity Lungs: Clear throughout to auscultation Heart: Regular rate and rhythm Abdomen: Soft, non-distended.  Normal bowel sounds.  Slightly epigastric TTP. Musculoskeletal: Symmetrical with no gross deformities  Skin: No lesions on visible extremities Extremities: No edema  Neurological: Alert oriented x 4, grossly non-focal Psychological:  Alert  and cooperative. Normal mood and affect  ASSESSMENT AND PLAN: -81 year-old female with ongoing complaints of upper abdominal pain and nausea. She has undergone extensive GI evaluation. Her reflux seems well controlled on PPI according to pH study.  She has tried several GI medications.  Patient very distressed by her symptoms.  Will check HIDA scan with CCK as last effort to explain her symptoms other than anxiety/psychologic source.  Will try carafate suspension ACHS in the interim.  CC:  Noralee Space, MD

## 2016-04-11 NOTE — Progress Notes (Signed)
Reviewed and agree with initial management plan.  Shelia Magallon T. Cauy Melody, MD FACG 

## 2016-04-11 NOTE — Patient Instructions (Signed)
You have been scheduled for a HIDA scan at [facility]iology (1st floor) on 04/19/2016. Please arrive 15 minutes prior to your scheduled appointment at  [phone]. Make certain not to have anything to eat or drink after midnight prior to your test. Should this appointment date or time not work well for you, please call radiology scheduling at [phone].  _____________________________________________________________________ hepatobiliary (HIDA) scan is an imaging procedure used to diagnose problems in the liver, gallbladder and bile ducts. In the HIDA scan, a radioactive chemical or tracer is injected into a vein in your arm. The tracer is handled by the liver like bile. Bile is a fluid produced and excreted by your liver that helps your digestive system break down fats in the foods you eat. Bile is stored in your gallbladder and the gallbladder releases the bile when you eat a meal. A special nuclear medicine scanner (gamma camera) tracks the flow of the tracer from your liver into your gallbladder and small intestine.  During your HIDA scan  You'll be asked to change into a hospital gown before your HIDA scan begins. Your health care team will position you on a table, usually on your back. The radioactive tracer is then injected into a vein in your arm.The tracer travels through your bloodstream to your liver, where it's taken up by the bile-producing cells. The radioactive tracer travels with the bile from your liver into your gallbladder and through your bile ducts to your small intestine.You may feel some pressure while the radioactive tracer is injected into your vein. As you lie on the table, a special gamma camera is positioned over your abdomen taking pictures of the tracer as it moves through your body. The gamma camera takes pictures continually for about an hour. You'll need to keep still during the HIDA scan. This can become uncomfortable, but you may find that you can lessen the discomfort by  taking deep breaths and thinking about other things. Tell your health care team if you're uncomfortable. The radiologist will watch on a computer the progress of the radioactive tracer through your body. The HIDA scan may be stopped when the radioactive tracer is seen in the gallbladder and enters your small intestine. This typically takes about an hour. In some cases extra imaging will be performed if original images aren't satisfactory, if morphine is given to help visualize the gallbladder or if the medication CCK is given to look at the contraction of the gallbladder. This test typically takes 2 hours to complete. ________________________________________________________________________   We will send carafate suspension to your pharmacy

## 2016-04-18 ENCOUNTER — Ambulatory Visit (INDEPENDENT_AMBULATORY_CARE_PROVIDER_SITE_OTHER): Payer: Medicare Other | Admitting: Cardiovascular Disease

## 2016-04-18 ENCOUNTER — Encounter (INDEPENDENT_AMBULATORY_CARE_PROVIDER_SITE_OTHER): Payer: Self-pay

## 2016-04-18 ENCOUNTER — Encounter: Payer: Self-pay | Admitting: Cardiovascular Disease

## 2016-04-18 VITALS — BP 130/62 | HR 63 | Ht <= 58 in | Wt 136.1 lb

## 2016-04-18 DIAGNOSIS — J3089 Other allergic rhinitis: Secondary | ICD-10-CM | POA: Diagnosis not present

## 2016-04-18 DIAGNOSIS — I2583 Coronary atherosclerosis due to lipid rich plaque: Secondary | ICD-10-CM

## 2016-04-18 DIAGNOSIS — J301 Allergic rhinitis due to pollen: Secondary | ICD-10-CM | POA: Diagnosis not present

## 2016-04-18 DIAGNOSIS — I2510 Atherosclerotic heart disease of native coronary artery without angina pectoris: Secondary | ICD-10-CM | POA: Diagnosis not present

## 2016-04-18 MED ORDER — NITROGLYCERIN 0.4 MG SL SUBL: 0.4000 mg | SUBLINGUAL_TABLET | SUBLINGUAL | 2 refills | Status: DC | PRN

## 2016-04-18 MED ORDER — AMLODIPINE BESYLATE 5 MG PO TABS: 5.0000 mg | ORAL_TABLET | Freq: Every day | ORAL | 3 refills | Status: DC

## 2016-04-18 MED FILL — NITROGLYCERIN 0.4 MG TAB SL: 0.4 | 8 days supply | Qty: 25 | Fill #0

## 2016-04-18 MED FILL — AMLODIPINE BESYLATE 5 MG TA: 5 | 90 days supply | Qty: 90 | Fill #0

## 2016-04-18 NOTE — Patient Instructions (Signed)

## 2016-04-19 ENCOUNTER — Encounter (HOSPITAL_COMMUNITY)
Admission: RE | Admit: 2016-04-19 | Discharge: 2016-04-19 | Disposition: A | Discharge status: Home / Self Care | Payer: Medicare Other | Source: Ambulatory Visit | Attending: Gastroenterology | Admitting: Gastroenterology

## 2016-04-19 DIAGNOSIS — R1013 Epigastric pain: Secondary | ICD-10-CM | POA: Insufficient documentation

## 2016-04-19 DIAGNOSIS — R110 Nausea: Secondary | ICD-10-CM | POA: Insufficient documentation

## 2016-04-19 MED ORDER — TECHNETIUM TC 99M MEBROFENIN IV KIT
4.9000 | PACK | Freq: Once | INTRAVENOUS | Status: AC | PRN
  Administered 2016-04-19: 4.9 via INTRAVENOUS

## 2016-04-19 MED ORDER — SINCALIDE 5 MCG IJ SOLR
0.0200 ug/kg | Freq: Once | INTRAMUSCULAR | Status: AC
  Administered 2016-04-19: 1.2 ug via INTRAVENOUS

## 2016-04-25 ENCOUNTER — Encounter: Payer: Self-pay | Admitting: Neurology

## 2016-04-25 ENCOUNTER — Ambulatory Visit (INDEPENDENT_AMBULATORY_CARE_PROVIDER_SITE_OTHER): Payer: Medicare Other | Admitting: Neurology

## 2016-04-25 VITALS — BP 132/70 | HR 58 | Resp 20 | Ht <= 58 in | Wt 134.0 lb

## 2016-04-25 DIAGNOSIS — G4733 Obstructive sleep apnea (adult) (pediatric): Secondary | ICD-10-CM

## 2016-04-25 DIAGNOSIS — I2583 Coronary atherosclerosis due to lipid rich plaque: Secondary | ICD-10-CM

## 2016-04-25 DIAGNOSIS — I2510 Atherosclerotic heart disease of native coronary artery without angina pectoris: Secondary | ICD-10-CM | POA: Diagnosis not present

## 2016-04-25 DIAGNOSIS — Z9989 Dependence on other enabling machines and devices: Secondary | ICD-10-CM

## 2016-04-25 NOTE — Progress Notes (Signed)
Subjective:    Patient ID: [name] is a 81 y.o. female.  HPI   Interim history:   [name] is a very pleasant 81 year old right-handed woman with an underlying medical history of fibromyalgia, coronary artery disease, status post CABG, irritable bowel syndrome, diverticulosis, breast cancer, hyperlipidemia, glaucoma, hypertension and overweight state, who presents for follow-up consultation of her obstructive sleep apnea, on treatment with CPAP at home. The patient is unaccompanied today. I last saw her on 04/26/2015, at which time [name] was compliant with CPAP therapy, [name] was doing fairly well. We talked about her sleep study results from 04/25/2014, [name] had trigger point injection under Dr. Tomi Likens, was referred to physical therapy and had 2 treatments with dry needling. I suggested a new checkup from the sleep apnea standpoint. [name] had an appointment pending with Dr. Delice Lesch.   Today, 04/25/2016 (all dictated new, as well as above notes, some dictation done in note pad or Word, outside of chart, may appear as copied):  I reviewed her CPAP compliance data from 03/25/2016 through 04/23/2016, which is a total of 30 days, during which time [name] used her CPAP every night with percent used days greater than 4 hours at 93%, indicating excellent compliance with an average usage of 6 hours and 49 minutes, residual AHI low at 0.5 per hour, leak low for the 95th percentile at 1.4 L/m on a pressure of 6 cm with EPR. [name] reports doing well, sometimes [name] forgets to put the mask back on after [name] comes back from the bathroom at night. [name] is finishing her Tamoxifen, has completed her 5 years last year. [name] has some weight loss compared to last year, is not going to the gym. Does walk in the neighborhood. Sees Dr. Delice Lesch in April, has done PT x 2 for her neck.  Saw her cardiologist last week, I reviewed the note from 04/18/16. Sees Dr. Quinn Axe for GI issues.   The patient's allergies, current medications,  family history, past medical history, past social history, past surgical history and problem list were reviewed and updated as appropriate.   Previously (copied from previous notes for reference):   I first met her on 10/27/2014 at the request of Dr. Krista Blue and Cecille Rubin, at which time the patient was advised regarding her split-night sleep study results from 04/25/2014. [name] had evidence of moderate obstructive sleep apnea. [name] did well with CPAP. [name] was compliant with CPAP therapy at the time and indicated improvement of her sleep including feeling better rested and having less daytime somnolence. Headaches were about the same and [name] was supposed to start seeing Dr. Delice Lesch for this.     I reviewed her CPAP compliance data from 03/26/2015 through 04/24/2015 which is a total of 30 days during which time [name] used her machine every night with percent used days greater than 4 hours at 100%, indicating superb compliance with an average usage of 7 hours and 1 minute, residual AHI low at 0.6 per hour, leak low with the 95th percentile at 0.9 L/m at a pressure of 6 cm with EPR.   10/27/2014: [name] was referred for a sleep study. [name] reported morning headaches, nocturia, nonrestorative sleep and excessive daytime somnolence as well as snoring. [name] had a split-night sleep study on 04/25/2014 and I went over her test results with her in detail today. Her baseline sleep efficiency was reduced at 53.5% with a latency to sleep of 42.5 minutes and wake after sleep onset of 61 minutes with moderate  to severe sleep fragmentation noted. [name] had a markedly elevated arousal index. [name] had an increased percentage of light stage sleep and absence of slow-wave sleep and REM sleep. [name] had PLMS at 62 per hour, with minimal arousals. [name] had mild to moderate snoring. Total AHI was 16.1 per hour. Average oxygen saturation was 95%, nadir was 85%. [name] was then titrated on CPAP. Sleep efficiency was 83.3%, latency to sleep 9 minutes  and wake after sleep onset 16.5 minutes with mild sleep fragmentation noted. [name] had slow-wave sleep at 8.3% in rem sleep at 25.6%. Average oxygen saturation was 98%, nadir was 96%. Snoring was eliminated. [name] had a PLM index of 43.5 per hour with no arousals. [name] was titrated from 5 cm to 6 cm. AHI was 0 per hour on the final pressure with supine REM sleep achieved. Based on her test results are prescribed CPAP therapy for home use.   I reviewed her CPAP compliance data from 09/26/2014 through 10/25/2014 which is a total of 30 days during which time [name] used her machine every night with percent used days greater than 4 hours at 97%, indicating excellent compliance with an average usage of 7 hours and 4 minutes, residual AHI low at syrup 0.5 per hour, leak low with the 95th percentile at 3.8 L/m on a pressure of 6 cm.   [name] reports doing fairly well. Overall, [name] feels a little bit better rested when [name] uses CPAP and still is struggling somewhat with the mask. [name] uses nasal pillows. [name] started CPAP therapy in mid June. [name] had some questions about the machine and how to clean the house and so forth and I tried to answer them but I also asked her to get in touch with her DME company, [employer]. As far as her bedtime goes, [name] tries to be in bed between 10 and 10:30. Her rise time is 6 to 6:30 AM. [name] used to have restless leg symptoms even as a child. Her husband has reported to her that her twitching has improved. [name] does not endorse bothersome restless leg symptoms at this time. Her headaches are about the same. Her nocturia is still about the same, 2-3 times in an average night. [name] does not drink alcohol and does not drink caffeine on a regular basis and is a nonsmoker. [name] lives with her husband. [name] has 2 grown daughters and 3 granddaughters.   Her Past Medical History Is Significant For: Past Medical History:  Diagnosis Date  . Asthma   . Atrophic vaginitis   . Breast cancer, Left 12/20/2010   NO  BLOOD PRESSURE CHECKS OR STICKS IN LEFT ARM  . CAD (coronary artery disease)    a. 01/19/2010 s/p CABG x 3, lima->lad, vg->diag, vg->om1;  b. 06/2011 :Lexi MV: EF 84%, No ischemia. c. 01/06/14 s/p negative nuclear stress test with EF >70%  . Cervical arthritis ([place])   . Colonic polyp 04-27-2009   tubular adenoma  . Diverticulosis of colon (without mention of hemorrhage)   . Fibromyalgia   . Gallstones   . GERD (gastroesophageal reflux disease)   . Glaucoma   . Headache(784.0)    a. frequently assocaited with high BPs  . Hiatal hernia   . Hypercholesterolemia   . Irritable bowel syndrome   . Labile hypertension     Her Past Surgical History Is Significant For: Past Surgical History:  Procedure Laterality Date  . [place] STUDY N/A 01/08/2016   Procedure: [place] STUDY;  Surgeon:  Ladene Artist, MD;  Location: [facility];  Service: Endoscopy;  Laterality: N/A;  . BREAST LUMPECTOMY Left 02/06/11  . CATARACT EXTRACTION, BILATERAL     bilateral caaract removal,  . CORONARY ANGIOPLASTY WITH STENT PLACEMENT     Stent 2007  . CORONARY ARTERY BYPASS GRAFT    . ESOPHAGEAL MANOMETRY N/A 01/08/2016   Procedure: ESOPHAGEAL MANOMETRY (EM);  Surgeon: Ladene Artist, MD;  Location: [facility];  Service: Endoscopy;  Laterality: N/A;  . open heart surgery     01/19/2010    Her Family History Is Significant For: Family History  Problem Relation Age of Onset  . Stroke Mother   . Stroke Father   . Prostate cancer Father   . Breast cancer Sister   . Diabetes Sister   . Cancer Brother     gland cancer  . Heart disease Brother   . Colon cancer Neg Hx   . Stomach cancer Neg Hx   . Pancreatic cancer Neg Hx   . Kidney disease Neg Hx   . Liver disease Neg Hx     Her Social History Is Significant For: Social History   Social History  . Marital status: Married    Spouse name: Gildardo Griffes. [place]  . Number of children: 2  . Years of education: 12   Occupational History  . part  time preschool teacher-retired [employer]   Social History Main Topics  . Smoking status: Never Smoker  . Smokeless tobacco: Never Used  . Alcohol use No  . Drug use: No  . Sexual activity: Yes    Birth control/ protection: Post-menopausal   Other Topics Concern  . None   Social History Narrative   Patient lives at home with her husband Marcello Moores). Patient is retired. Patient  Has 12 th grade education.    Caffeine- sometimes- One cup of coffee.   Right handed.   Four granddaughters.    Her Allergies Are:  Allergies  Allergen Reactions  . Choline Fenofibrate Other (See Comments)     pt states INTOL to Trilipix w/ "thigh burning"  . Simvastatin Other (See Comments)     pt states INTOL to STATINS' \\T'$ \ refuses to restart  . Bentyl [Dicyclomine Hcl]     Made me feel weird and drained me  . Hctz [Hydrochlorothiazide]     Causes hyponatremia  . Hydrocodone     Nightmare after taking cough syrup w/hydrocodone  . Levaquin [Levofloxacin In D5w]     Elevated BP  . Adhesive [Tape] Rash  . Ceclor [Cefaclor] Rash  . Clarithromycin Rash  . Codeine Nausea Only  . Doxycycline Rash  . Lisinopril Cough    Developed ACE cough...  . Penicillins Itching and Rash    At injection site  . Tobramycin-Dexamethasone Rash  :   Her Current Medications Are:  Outpatient Encounter Prescriptions as of 04/25/2016  Medication Sig  . Acetaminophen (TYLENOL 8 HOUR PO) Take by mouth as directed.   [name] albuterol (VENTOLIN HFA) 108 (90 Base) MCG/ACT inhaler Inhale 2 puffs into the lungs every 6 (six) hours as needed for wheezing or shortness of breath.  [name] amLODipine (NORVASC) 5 MG tablet Take 1 tablet (5 mg total) by mouth daily.  [name] aspirin EC 81 MG tablet Take 81 mg by mouth daily.  . Calcium & Magnesium Carbonates (MYLANTA PO) Take by mouth. 1-2 teaspoons as needed  . fexofenadine (ALLEGRA) 180 MG tablet Take 180 mg by mouth daily.    . fluocinonide (  LIDEX) 0.05 % external solution Apply 1  application topically daily as needed (scalp itching).   . Glucosamine-Chondroit-Vit C-Mn (GLUCOSAMINE CHONDR 1500 COMPLX PO) Take 1 tablet by mouth 3 (three) times daily.    . hydrALAZINE (APRESOLINE) 50 MG tablet take 1/2 tablet by mouth three times a day  . ketoconazole (NIZORAL) 2 % shampoo Apply 1 application topically 2 (two) times a week. As needed for itching a couple of times week  . latanoprost (XALATAN) 0.005 % ophthalmic solution Place 1 drop into both eyes at bedtime.   [name] losartan (COZAAR) 100 MG tablet take 1/2 tablet by mouth three times a day  . magnesium oxide (MAG-OX) 400 MG tablet Take 400 mg by mouth daily. Reported on 02/14/2015  . metoprolol (LOPRESSOR) 50 MG tablet take 1 tablet by mouth three times a day  . mometasone (NASONEX) 50 MCG/ACT nasal spray Place 2 sprays into the nose daily as needed (congestion).   . nitroGLYCERIN (NITROSTAT) 0.4 MG SL tablet Place 1 tablet (0.4 mg total) under the tongue every 5 (five) minutes as needed for chest pain.  . Omega-3 Fatty Acids (FISH OIL) 1200 MG CAPS Take 1,200 mg by mouth daily. Reported on 02/14/2015  . pantoprazole (PROTONIX) 40 MG tablet Take 1 tablet (40 mg total) by mouth daily.  . Psyllium (METAMUCIL FIBER PO) Take 1 tablet by mouth daily.   . ranitidine (ZANTAC) 300 MG capsule Take 1 capsule (300 mg total) by mouth every evening.  . sucralfate (CARAFATE) 1 GM/10ML suspension Take 1 g by mouth 4 (four) times daily.  . vitamin B-12 (CYANOCOBALAMIN) 1000 MCG tablet Take 1,000 mcg by mouth daily.    . [DISCONTINUED] sucralfate (CARAFATE) 1 g tablet Take 0.5 g by mouth 4 (four) times daily as needed.   No facility-administered encounter medications on file as of 04/25/2016.   :  Review of Systems:  Out of a complete 14 point review of systems, all are reviewed and negative with the exception of these symptoms as listed below: Review of Systems  Neurological:       Pt is doing well on cpap but sometimes forgets to put in on  after [name] uses the bathroom during the night.    Objective:  Neurologic Exam  Physical Exam Physical Examination:   Vitals:   04/25/16 0906  BP: 132/70  Pulse: (!) 58  Resp: 20    General Examination: The patient is a very pleasant 81 y.o. female in no acute distress. [name] appears well-developed and well-nourished and well groomed.   HEENT: Normocephalic, atraumatic, pupils are equal, round and reactive to light and accommodation. Extraocular tracking is good without limitation to gaze excursion or nystagmus noted. Normal smooth pursuit is noted. Hearing is grossly intact. Face is symmetric with normal facial animation and normal facial sensation. Speech is clear with no dysarthria noted. There is no hypophonia. There is no lip, neck/head, jaw or voice tremor. Neck is supple with full range of passive and active motion. There are no carotid bruits on auscultation. Oropharynx exam reveals: mild mouth dryness, adequate dental hygiene and mild airway crowding. Mallampati is class II. Tongue protrudes centrally and palate elevates symmetrically. Tonsils are small.   Chest: Clear to auscultation without wheezing, rhonchi or crackles noted.  Heart: S1+S2+0, regular and normal without murmurs, rubs or gallops noted.   Abdomen: Soft, non-tender and non-distended with normal bowel sounds appreciated on auscultation.  Extremities: There is no pitting edema in the distal lower extremities bilaterally. Pedal pulses are  intact.  Skin: Warm and dry without trophic changes noted.  Musculoskeletal: exam reveals no obvious joint deformities, tenderness or joint swelling or erythema.   Neurologically:  Mental status: The patient is awake, alert and oriented in all 4 spheres. Her immediate and remote memory, attention, language skills and fund of knowledge are appropriate. There is no evidence of aphasia, agnosia, apraxia or anomia. Speech is clear with normal prosody and enunciation. Thought process is  linear. Mood is normal and affect is normal.  Cranial nerves II - XII are as described above under HEENT exam. In addition: shoulder shrug is normal with equal shoulder height noted. Motor exam: Normal bulk, strength and tone is noted. There is no drift, tremor or rebound. Romberg is negative. Reflexes are 1+ throughout. Fine motor skills and coordination: intact with normal finger taps, normal hand movements, normal rapid alternating patting, normal foot taps and normal foot agility.  Cerebellar testing: No dysmetria or intention tremor on finger to nose testing. Heel to shin is unremarkable bilaterally.  Sensory exam: intact to light touch in the upper and lower extremities.  Gait, station and balance: [name] stands easily. No veering to one side is noted. No leaning to one side is noted. Posture is age-appropriate and stance is narrow based. Gait shows normal stride length and normal pace. Tandem walk is good for age.   Assessment and Plan:  In summary, KYSHA MURALLES is a very pleasant 81 y.o.-year old female with anUnderlying medical history of coronary artery disease with status post CABG, irritable bowel syndrome, reflux disease, diverticulosis, history of breast cancer, fibromyalgia, hyperlipidemia, neck pain, glaucoma, hypertension and overweight state, who presents for follow-up consultation of her OSA and established on CPAP therapy since mid 2016 with full compliance and good results. [name] is doing well, exam is good today. [name] is commended for her excellent treatment adherence with CPAP. [name] noted benefit as in more rested. [name] is advised to have a goal of 7 to 7:30 hours of sleep. [name] is advised to stay well hydrated with water and stay active physically. Her compliance looks great, [name] is followed by cardiology on a 6 monthly basis, [name] is seeing a new GI provider for ongoing issues with stomach pain. [name] has had some changes in her bowel habits. [name] is advised consider adding more probiotic  such as [place] or a probiotic drink to her diet. For her headaches and neck pain, [name] has been seeing Dr. Delice Lesch.  [name] does not need any new supplies for her CPAP machine. From my end of things [name] is doing well. I suggested a yearly checkup for sleep apnea. I answered all her questions today and [name] was in agreement. [name] is encouraged to call for any interim problems with her CPAP machine or sleep apnea. I spent 25 minutes in total face-to-face time with the patient, more than 50% of which was spent in counseling and coordination of care, reviewing test results, reviewing medication and discussing or reviewing the diagnosis of OSA, its prognosis and treatment options. Pertinent laboratory and imaging test results that were available during this visit with the patient were reviewed by me and considered in my medical decision making (see chart for details).

## 2016-04-25 NOTE — Patient Instructions (Signed)
Please continue using your CPAP regularly. While your insurance requires that you use CPAP at least 4 hours each night on 70% of the nights, I recommend, that you not skip any nights and use it throughout the night if you can. Getting used to CPAP and staying with the treatment long term does take time and patience and discipline. Untreated obstructive sleep apnea when it is moderate to severe can have an adverse impact on cardiovascular health and raise her risk for heart disease, arrhythmias, hypertension, congestive heart failure, stroke and diabetes. Untreated obstructive sleep apnea causes sleep disruption, nonrestorative sleep, and sleep deprivation. This can have an impact on your day to day functioning and cause daytime sleepiness and impairment of cognitive function, memory loss, mood disturbance, and problems focussing. Using CPAP regularly can improve these symptoms.  Keep up the good work! I will see you back in 12 months for sleep apnea.    

## 2016-05-01 DIAGNOSIS — J301 Allergic rhinitis due to pollen: Secondary | ICD-10-CM | POA: Diagnosis not present

## 2016-05-01 DIAGNOSIS — J3089 Other allergic rhinitis: Secondary | ICD-10-CM | POA: Diagnosis not present

## 2016-05-01 DIAGNOSIS — K219 Gastro-esophageal reflux disease without esophagitis: Secondary | ICD-10-CM | POA: Diagnosis not present

## 2016-05-01 DIAGNOSIS — J4520 Mild intermittent asthma, uncomplicated: Secondary | ICD-10-CM | POA: Diagnosis not present

## 2016-05-02 ENCOUNTER — Ambulatory Visit: Payer: Medicare Other | Admitting: Gastroenterology

## 2016-05-07 ENCOUNTER — Encounter (INDEPENDENT_AMBULATORY_CARE_PROVIDER_SITE_OTHER): Payer: Self-pay

## 2016-05-07 ENCOUNTER — Ambulatory Visit (INDEPENDENT_AMBULATORY_CARE_PROVIDER_SITE_OTHER): Payer: Medicare Other | Admitting: Gastroenterology

## 2016-05-07 ENCOUNTER — Encounter: Payer: Self-pay | Admitting: Gastroenterology

## 2016-05-07 VITALS — BP 138/70 | HR 60 | Resp 18 | Ht <= 58 in | Wt 136.8 lb

## 2016-05-07 DIAGNOSIS — K229 Disease of esophagus, unspecified: Secondary | ICD-10-CM

## 2016-05-07 DIAGNOSIS — K219 Gastro-esophageal reflux disease without esophagitis: Secondary | ICD-10-CM | POA: Diagnosis not present

## 2016-05-07 DIAGNOSIS — I2510 Atherosclerotic heart disease of native coronary artery without angina pectoris: Secondary | ICD-10-CM

## 2016-05-07 DIAGNOSIS — K224 Dyskinesia of esophagus: Secondary | ICD-10-CM

## 2016-05-07 DIAGNOSIS — K588 Other irritable bowel syndrome: Secondary | ICD-10-CM

## 2016-05-07 DIAGNOSIS — I2583 Coronary atherosclerosis due to lipid rich plaque: Secondary | ICD-10-CM

## 2016-05-07 MED ORDER — SUCRALFATE 1 GM/10ML PO SUSP: 1.0000 g | Freq: Four times a day (QID) | ORAL | 11 refills | Status: DC

## 2016-05-07 NOTE — Progress Notes (Signed)
    History of Present Illness: This is a 81 year old female here for follow up of abdominal pain, chest pain and dysphagia. See office notes from 03/19/2016 and 01/03/2016. She relates that she has had good relief of her symptoms with Carafate suspension, IBgard as needed and FDgard as needed. She states she has had a difficult time finding FDgard. Pantoprazole and ranitidine are working well to control GERD. She had many questions about her diagnosis, treatment and medication usage.   HIDA 04/19/2016 EF=94%  Review of Systems: Pertinent positive and negative review of systems were noted in the above HPI section. All other review of systems were otherwise negative.  Current Medications, Allergies, Past Medical History, Past Surgical History, Family History and Social History were reviewed in [employer] record.  Physical Exam: General: Well developed, well nourished, no acute distress Head: Normocephalic and atraumatic Eyes:  sclerae anicteric, EOMI Ears: Normal auditory acuity Mouth: No deformity or lesions Neck: Supple, no masses or thyromegaly Lungs: Clear throughout to auscultation Heart: Regular rate and rhythm; no murmurs, rubs or bruits Abdomen: Soft, non tender and non distended. No masses, hepatosplenomegaly or hernias noted. Normal Bowel sounds Musculoskeletal: Symmetrical with no gross deformities  Skin: No lesions on visible extremities Pulses:  Normal pulses noted Extremities: No clubbing, cyanosis, edema or deformities noted Neurological: Alert oriented x 4, grossly nonfocal Cervical Nodes:  No significant cervical adenopathy Inguinal Nodes: No significant inguinal adenopathy Psychological:  Alert and cooperative. Normal mood and affect  Assessment and Recommendations:  1. IBS. Continue IBgard 1-2 tid prn and Metamucil daily. Avoid foods that trigger symptoms.    2. Hypercontractile esophagus with intermittent dysphagia, epigastric pain and chest  pain. Continue Carafate suspension 1g qid, FD guard 1-2 tid prn and NTG SL prn for severe symptoms. REV in 1 year.   3. GERD. Well controlled. Continue pantoprazole 40 mg daily and ranitidine 300 mg at bedtime. REV in 1 year.   I spent 15 minutes of face-to-face time with the patient. Greater than 50% of the time was spent counseling and coordinating care.

## 2016-05-07 NOTE — Patient Instructions (Signed)
We have sent the following medications to your pharmacy for you to pick up at your convenience: Carafate suspension.   You have been given samples of FD Guard and coupon to take to your pharmacy.  Thank you for choosing me and [facility].  Pricilla Riffle. Dagoberto Ligas., MD., Mar[facility] Regal

## 2016-05-29 ENCOUNTER — Ambulatory Visit (INDEPENDENT_AMBULATORY_CARE_PROVIDER_SITE_OTHER): Payer: Medicare Other | Admitting: Neurology

## 2016-05-29 ENCOUNTER — Encounter: Payer: Self-pay | Admitting: Neurology

## 2016-05-29 VITALS — BP 140/70 | HR 62 | Ht <= 58 in | Wt 136.1 lb

## 2016-05-29 DIAGNOSIS — J301 Allergic rhinitis due to pollen: Secondary | ICD-10-CM | POA: Diagnosis not present

## 2016-05-29 DIAGNOSIS — G4486 Cervicogenic headache: Secondary | ICD-10-CM

## 2016-05-29 DIAGNOSIS — I2510 Atherosclerotic heart disease of native coronary artery without angina pectoris: Secondary | ICD-10-CM

## 2016-05-29 DIAGNOSIS — I2583 Coronary atherosclerosis due to lipid rich plaque: Secondary | ICD-10-CM

## 2016-05-29 DIAGNOSIS — R51 Headache: Secondary | ICD-10-CM

## 2016-05-29 DIAGNOSIS — J3089 Other allergic rhinitis: Secondary | ICD-10-CM | POA: Diagnosis not present

## 2016-05-29 NOTE — Patient Instructions (Addendum)
1. Continue home exercises 2. Look into massage therapy 3. If arm symptoms worsen, call our office and we will do nerve test and PT 4. Follow-up in 1 year, call for any changes

## 2016-05-29 NOTE — Progress Notes (Signed)
NEUROLOGY FOLLOW UP OFFICE NOTE  [name] [id number]  HISTORY OF PRESENT ILLNESS: I had the pleasure of seeing [name] in follow-up in the neurology clinic on 05/29/2016.  The patient was last seen a year ago for headaches and neck pain. At that time she was having chronic daily headaches likely cervicogenic. She had occipital nerve blocks with not much improvement. She had good response to physical therapy and had dry needling twice. She had good response to the first dry needling, but states she did not get much response after the second course. She continues to do her home exercises and states that her headaches are not as bad as they were. Her neck is not as bad as it was, the tightness still comes once a week. She sometimes has sharp pains on the top of her head. No nausea, vomiting, photo/phonophobia. She has had right hip pain going down her foot, she describes a hot wire going down her leg, or sometimes down her arms, right more than left. Her hand goes to sleep when she is driving. No bowel/bladder dysfunction. No falls.   HPI: This is a pleasant 81 yo RH woman with a history of hypertension, hyperlipidemia, CAD, breast cancer s/p lumpectomy, chemotherapy and radiation, with daily headaches. She had been seeing neurologist Dr. Krista Blue since 2014. Records were reviewed. Ms. Radziewicz reports a history of migraines since her 33s. These quieted down, until 2014 when she started having headaches that she reports were different from her migraines in the past. It starts in the bilateral occipital region, her neck feels strange, "like it's about to break in the middle," then radiates to the frontal region like a tight band of pressure. This is associated with nausea. Her last migraine was 1 year ago. She was reporting 15 headaches days a month. MRI cervical spine had shown multilevel degenerative disc disease, mild right foraminal stenosis at C3-4 secondary to asymmetric right-sided facet  hypertrophy and uncovertebral disease, facet hypertrophy and disc osteophyte complexes at C4-5 with mild central canal narrowing, mild to moderate left central canal stenosis C5-6 without significant foraminal disease, mild central and left foraminal stenosis at C6-7 secondary to a broad-based disc osteophyte complex and uncovertebral disease, mild left foraminal narrowing at C7-C1 secondary to facet disease. ESR normal. In 2015, she was admitted for hyponatremia and headaches. Headaches had increased in frequency, described as a burning and pressure sensation. Ultram would help some. She was noted to have elevated blood pressure during times of headaches. According to notes, on her last visit in August 2016, she had reported improvement in the headaches but still frequent, and was offered the option of an occipital nerve block.   She reports having the headaches constantly, waxing and waning in intensity from 3/10 to 10/10. She takes Tramadol or Arthritis Tylenol, applying ice helps. She chews ginger for the nausea. In the past, seeing her chiropractor used to help, but not anymore. She had PT for neck pain in 2015 and was told her neck was so tight. She feels slightly better lying down. She has had balance problems but denies any recent falls in the past year. She reports that her BP is overall better except this week when she has had more intense headaches. She started using a CPAP machine last June but has not noticed much change and still does not feel rested on awakening.   PAST MEDICAL HISTORY: Past Medical History:  Diagnosis Date  . Asthma   . Atrophic vaginitis   .  Breast cancer, Left 12/20/2010   NO BLOOD PRESSURE CHECKS OR STICKS IN LEFT ARM  . CAD (coronary artery disease)    a. 01/19/2010 s/p CABG x 3, lima->lad, vg->diag, vg->om1;  b. 06/2011 :Lexi MV: EF 84%, No ischemia. c. 01/06/14 s/p negative nuclear stress test with EF >70%  . Cervical arthritis ([place])   . Colonic polyp 04-27-2009    tubular adenoma  . Diverticulosis of colon (without mention of hemorrhage)   . Fibromyalgia   . Gallstones   . GERD (gastroesophageal reflux disease)   . Glaucoma   . Headache(784.0)    a. frequently assocaited with high BPs  . Hiatal hernia   . Hypercholesterolemia   . Irritable bowel syndrome   . Labile hypertension     MEDICATIONS: Current Outpatient Prescriptions on File Prior to Visit  Medication Sig Dispense Refill  . Acetaminophen (TYLENOL 8 HOUR PO) Take by mouth as directed.     [name] albuterol (VENTOLIN HFA) 108 (90 Base) MCG/ACT inhaler Inhale 2 puffs into the lungs every 6 (six) hours as needed for wheezing or shortness of breath.    [name] amLODipine (NORVASC) 5 MG tablet Take 1 tablet (5 mg total) by mouth daily. 90 tablet 3  . aspirin EC 81 MG tablet Take 81 mg by mouth daily.    . Calcium & Magnesium Carbonates (MYLANTA PO) Take by mouth. 1-2 teaspoons as needed    . fexofenadine (ALLEGRA) 180 MG tablet Take 180 mg by mouth daily.      . fluocinonide (LIDEX) 0.05 % external solution Apply 1 application topically daily as needed (scalp itching).     . Glucosamine-Chondroit-Vit C-Mn (GLUCOSAMINE CHONDR 1500 COMPLX PO) Take 1 tablet by mouth 3 (three) times daily.      . hydrALAZINE (APRESOLINE) 50 MG tablet take 1/2 tablet by mouth three times a day 45 tablet 2  . ketoconazole (NIZORAL) 2 % shampoo Apply 1 application topically 2 (two) times a week. As needed for itching a couple of times week    . latanoprost (XALATAN) 0.005 % ophthalmic solution Place 1 drop into both eyes at bedtime.     [name] losartan (COZAAR) 100 MG tablet take 1/2 tablet by mouth three times a day 45 tablet 5  . magnesium oxide (MAG-OX) 400 MG tablet Take 400 mg by mouth daily. Reported on 02/14/2015    . metoprolol (LOPRESSOR) 50 MG tablet take 1 tablet by mouth three times a day 270 tablet 2  . mometasone (NASONEX) 50 MCG/ACT nasal spray Place 2 sprays into the nose daily as needed (congestion).     .  nitroGLYCERIN (NITROSTAT) 0.4 MG SL tablet Place 1 tablet (0.4 mg total) under the tongue every 5 (five) minutes as needed for chest pain. 25 tablet 2  . Omega-3 Fatty Acids (FISH OIL) 1200 MG CAPS Take 1,200 mg by mouth daily. Reported on 02/14/2015    . pantoprazole (PROTONIX) 40 MG tablet Take 1 tablet (40 mg total) by mouth daily. 30 tablet 6  . Psyllium (METAMUCIL FIBER PO) Take 1 tablet by mouth daily.     . ranitidine (ZANTAC) 300 MG capsule Take 1 capsule (300 mg total) by mouth every evening. 30 capsule 3  . sucralfate (CARAFATE) 1 GM/10ML suspension Take 10 mLs (1 g total) by mouth 4 (four) times daily. 420 mL 11  . vitamin B-12 (CYANOCOBALAMIN) 1000 MCG tablet Take 1,000 mcg by mouth daily.       No current facility-administered medications on file prior to visit.  ALLERGIES: Allergies  Allergen Reactions  . Choline Fenofibrate Other (See Comments)     pt states INTOL to Trilipix w/ "thigh burning"  . Simvastatin Other (See Comments)     pt states INTOL to STATINS' \\T'$ \ refuses to restart  . Bentyl [Dicyclomine Hcl]     Made me feel weird and drained me  . Hctz [Hydrochlorothiazide]     Causes hyponatremia  . Hydrocodone     Nightmare after taking cough syrup w/hydrocodone  . Levaquin [Levofloxacin In D5w]     Elevated BP  . Adhesive [Tape] Rash  . Ceclor [Cefaclor] Rash  . Clarithromycin Rash  . Codeine Nausea Only  . Doxycycline Rash  . Lisinopril Cough    Developed ACE cough...  . Penicillins Itching and Rash    At injection site  . Tobramycin-Dexamethasone Rash    FAMILY HISTORY: Family History  Problem Relation Age of Onset  . Stroke Mother   . Stroke Father   . Prostate cancer Father   . Breast cancer Sister   . Diabetes Sister   . Cancer Brother     gland cancer  . Heart disease Brother   . Colon cancer Neg Hx   . Stomach cancer Neg Hx   . Pancreatic cancer Neg Hx   . Kidney disease Neg Hx   . Liver disease Neg Hx     SOCIAL HISTORY: Social  History   Social History  . Marital status: Married    Spouse name: Gildardo Griffes. [place]  . Number of children: 2  . Years of education: 12   Occupational History  . part time preschool teacher-retired [employer]   Social History Main Topics  . Smoking status: Never Smoker  . Smokeless tobacco: Never Used  . Alcohol use No  . Drug use: No  . Sexual activity: Yes    Birth control/ protection: Post-menopausal   Other Topics Concern  . Not on file   Social History Narrative   Patient lives at home with her husband Marcello Moores). Patient is retired. Patient  Has 12 th grade education.    Caffeine- sometimes- One cup of coffee.   Right handed.   Four granddaughters.    REVIEW OF SYSTEMS: Constitutional: No fevers, chills, or sweats, no generalized fatigue, change in appetite Eyes: No visual changes, double vision, eye pain Ear, nose and throat: No hearing loss, ear pain, nasal congestion, sore throat Cardiovascular: No chest pain, palpitations Respiratory:  No shortness of breath at rest or with exertion, wheezes GastrointestinaI: No nausea, vomiting, diarrhea, abdominal pain, fecal incontinence Genitourinary:  No dysuria, urinary retention or frequency Musculoskeletal:  + neck pain, back pain Integumentary: No rash, pruritus, skin lesions Neurological: as above Psychiatric: No depression, insomnia, anxiety Endocrine: No palpitations, fatigue, diaphoresis, mood swings, change in appetite, change in weight, increased thirst Hematologic/Lymphatic:  No anemia, purpura, petechiae. Allergic/Immunologic: no itchy/runny eyes, nasal congestion, recent allergic reactions, rashes  PHYSICAL EXAM: Vitals:   05/29/16 1521  BP: 140/70  Pulse: 62   General: No acute distress Head:  Normocephalic/atraumatic, no occipital tenderness Neck: supple, + paraspinal tenderness, full range of motion Heart:  Regular rate and rhythm Lungs:  Clear to auscultation bilaterally Back: No  paraspinal tenderness Skin/Extremities: No rash, no edema Neurological Exam: alert and oriented to person, place, and time. No aphasia or dysarthria. Fund of knowledge is appropriate.  Recent and remote memory are intact.  Attention and concentration are normal.    Able to name objects and repeat phrases.  Cranial nerves: Pupils equal, round, reactive to light.  Fundoscopic exam unremarkable, no papilledema. Extraocular movements intact with no nystagmus. Visual fields full. Facial sensation intact. No facial asymmetry. Tongue, uvula, palate midline.  Motor: Bulk and tone normal, muscle strength 5/5 throughout with no pronator drift.  Sensation to light touch intact.  No extinction to double simultaneous stimulation.  Deep tendon reflexes 2+ throughout, toes downgoing.  Finger to nose testing intact.  Gait narrow-based and steady, able to tandem walk adequately.  Romberg negative.  IMPRESSION: This is a pleasant 81 yo RH woman with a history of hypertension, sleep apnea, migraines, who was having chronic daily headaches over the occipital and neck regions. She did not have much response to occipital nerve blocks but had good response to PT and dry needling for neck pain, suggestive of cervicogenic headaches. Headaches and neck pain are overall better, she continues to do home PT exercises. She has some right hip radiating down her foot as well as an electrical sensation down her arms, R>L. We discussed doing an EMG/NCV, she would like to hold off for now. If symptoms worsen, consider PT again. She does not want to take any medication such as gabapentin or Lyrica. She will follow-up in 1 year and knows to call for any changes.   Thank you for allowing me to participate in her care.  Please do not hesitate to call for any questions or concerns.  The duration of this appointment visit was 25 minutes of face-to-face time with the patient.  Greater than 50% of this time was spent in counseling, explanation of  diagnosis, planning of further management, and coordination of care.   Ellouise Newer, M.D.   CC: Dr. Lenna Gilford

## 2016-05-31 DIAGNOSIS — J3089 Other allergic rhinitis: Secondary | ICD-10-CM | POA: Diagnosis not present

## 2016-05-31 DIAGNOSIS — J301 Allergic rhinitis due to pollen: Secondary | ICD-10-CM | POA: Diagnosis not present

## 2016-06-05 DIAGNOSIS — J3089 Other allergic rhinitis: Secondary | ICD-10-CM | POA: Diagnosis not present

## 2016-06-05 DIAGNOSIS — J301 Allergic rhinitis due to pollen: Secondary | ICD-10-CM | POA: Diagnosis not present

## 2016-06-17 ENCOUNTER — Other Ambulatory Visit: Payer: Self-pay | Admitting: Cardiovascular Disease

## 2016-06-19 ENCOUNTER — Encounter: Payer: Self-pay | Admitting: Pulmonary Disease

## 2016-06-19 ENCOUNTER — Ambulatory Visit (INDEPENDENT_AMBULATORY_CARE_PROVIDER_SITE_OTHER): Payer: Medicare Other | Admitting: Pulmonary Disease

## 2016-06-19 VITALS — BP 120/62 | HR 55 | Temp 96.5°F | Ht <= 58 in | Wt 136.0 lb

## 2016-06-19 DIAGNOSIS — J4520 Mild intermittent asthma, uncomplicated: Secondary | ICD-10-CM | POA: Diagnosis not present

## 2016-06-19 DIAGNOSIS — R0989 Other specified symptoms and signs involving the circulatory and respiratory systems: Secondary | ICD-10-CM | POA: Diagnosis not present

## 2016-06-19 DIAGNOSIS — K581 Irritable bowel syndrome with constipation: Secondary | ICD-10-CM

## 2016-06-19 DIAGNOSIS — I2510 Atherosclerotic heart disease of native coronary artery without angina pectoris: Secondary | ICD-10-CM | POA: Diagnosis not present

## 2016-06-19 DIAGNOSIS — E559 Vitamin D deficiency, unspecified: Secondary | ICD-10-CM | POA: Diagnosis not present

## 2016-06-19 DIAGNOSIS — C50512 Malignant neoplasm of lower-outer quadrant of left female breast: Secondary | ICD-10-CM

## 2016-06-19 DIAGNOSIS — J301 Allergic rhinitis due to pollen: Secondary | ICD-10-CM | POA: Diagnosis not present

## 2016-06-19 DIAGNOSIS — E782 Mixed hyperlipidemia: Secondary | ICD-10-CM

## 2016-06-19 DIAGNOSIS — F411 Generalized anxiety disorder: Secondary | ICD-10-CM

## 2016-06-19 DIAGNOSIS — Z9989 Dependence on other enabling machines and devices: Secondary | ICD-10-CM | POA: Diagnosis not present

## 2016-06-19 DIAGNOSIS — Z170 Estrogen receptor positive status [ER+]: Secondary | ICD-10-CM

## 2016-06-19 DIAGNOSIS — I2583 Coronary atherosclerosis due to lipid rich plaque: Secondary | ICD-10-CM

## 2016-06-19 DIAGNOSIS — J3089 Other allergic rhinitis: Secondary | ICD-10-CM | POA: Diagnosis not present

## 2016-06-19 DIAGNOSIS — G4733 Obstructive sleep apnea (adult) (pediatric): Secondary | ICD-10-CM | POA: Diagnosis not present

## 2016-06-19 DIAGNOSIS — K219 Gastro-esophageal reflux disease without esophagitis: Secondary | ICD-10-CM

## 2016-06-19 MED ORDER — TRAMADOL HCL 50 MG PO TABS: ORAL_TABLET | ORAL | 5 refills | Status: DC

## 2016-06-19 MED FILL — traMADol HCL 50 MG TABS: 50 | 30 days supply | Qty: 90 | Fill #0

## 2016-06-19 NOTE — Patient Instructions (Signed)
Today we updated your med list in our EPIC system...    Continue your current medications the same...  Please return to our lab one day soon for your FASTING blood work...    We will contact you w/ the results when available...   Gradually increase your exercise capacity...  Call for any questions...  Let's plan a follow up visit in 66mo, sooner if needed for problems.Marland KitchenMarland Kitchen

## 2016-06-19 NOTE — Progress Notes (Signed)
Subjective:    Patient ID: [name], female    DOB: 09/10/1935, 81 y.o.   MRN: [id number]  HPI 81 y/o WF here for a follow up visit... she has multiple medical problems including AR & Asthma;  HBP;  CAD followed by [name] & s/p 3 vessel CABG 11/11 by DrGearhardt;  [facility];  GERD/ IBS/ Divertics;  Breast Cancer diagnosed 12/12;  Fibromyalgia;  Chr HAs & dizziness...   ~  SEE PREV EPIC NOTES FOR OLDER DATA >>   ~   CP 11/11=> cath w/ in-stent restenosis, then CABG x3 by DrGearhardt; re-hospitalized 6/12 by Cards for CP> cath showed severe 3 vessel CAD w/ 3/3 grafts patent & norm LVF w/ EF= 65-70%; Myoview 5/13 showed no ischemia & EF>80%; no change in meds- on Imdur30; & DrNishan noted that med rx is lim by side effects.  EKG 9/13 showed NSR, rate64, NSSTTWA, NAD...   CXR 10/13 showed heart at upper lim of norm, prior CABG, mild hyperinflation, clear, NAD...  Abd Ultrasound 10/13 by [name] for eval CP showed> norm GB, liver, kidneys, & Ao- neg sonar...  LABS 10-12/13 showed Chems- wnl;  CBC- wnl  LABS 2/14:  FLP- ok x LDL=110;  TSH=1.33   CXR 4/14 showed norm heart size, s/p CABG, tort thor Ao, bilat apical pleural scarring, NAD...  CXR 10/15 showed normal heart size, s/p CABG, unchanged biapical pleural scarring, otherw clear lungs w/o infiltrates, NAD...  ~   [name] was [facility] 10/29 - 12/27/13 by [facility] w/ HA, HBP, & hyponatremia (Na=119 ?etiology)> she had recently been treated for bronchitic exac w/ Levaquin & Mucinex, she thinks the HBP was a reaction "allergy" to Levaquin & doesn't want to take this antibiotic again; BP was 220 range in ER &treated w/ Cardene drip=> she was actually disch on less meds than PTA=> she returned to ER the day after disch w/ HBP & BP ~200 & she hadn't taken her Losartan yet, given IV hyralazine & BP improved to the 160s; now on Metop50Bid & Cozaar50 (prev on Hyzaar100-12.5); they suspected her diuretic in her hyponatremia which  corrected w/ IVF; she has a hx of HAs from DJD/neck and FM...  CXR 10/15 showed fe chr changes in lungs, norm heart size & s/p CABG, DJD in spine, NAD.Marland KitchenMarland Kitchen  EKG 11/15 showed NSR, rate69, NSSTTWA, NAD... ~  12/2013=> another hospitalization in the interval> [name] has had a continuing saga of HA & HBP, she has been to the ER, to Cardiology, Hospitalized, back to Cardiology, and almost daily phone calls for HA & HBP w/ BP ~200 sys> all notes reviewed;  Cards eval for secondary cause of the HBP has been neg;  Initially it was assumed that incr HA pain resulted in HBP but she has a hx of chronic daily HAs and she continues to have HAs when her BP is controlled;  She was last Hosp 11/11 - 01/06/14 and disch on ASA81, Imdur60, Hydralazine50Tid, Losar100-1/2Bid, Metop50Bid;  BP at disch was 100/47;  She saw [name] 11/16 on these meds w/ BP recorded 130/64, Imdur was stopped due to her HAs;  She has called daily since then w/ elev BP (esp at night she says) and we have rec strategies using her current meds to help her headaches and adjust BP meds:  CXR 11/15 showed norm heart size, prev CABG, clear lungs- some hyperinflation/ NAD, DJD Tspine...  Renal Sonar 11/15 was wnl...  CT Head 11/15 showed cortical atrophy and small vessel  dis, sinuses clear, NAD.Marland KitchenMarland Kitchen   EKG 11/15 showed NSR, rate64, borderline tracing w/?LAE & min NSSTTWA...  2DEcho 11/15 showed norm cavity size & thickness, norm EF= 60-65%, norm wall motion, norm diastolic function, essentially norm valves...   Myoview 11/15 showed low risk scan w/ abn EKG portion but no stress induced perfusion defects, hyperdynamic LVF w/ EF>70% & no regional wall motion abn  LABS 11/15:  Chems- wnl w/ Cr=0.6;  CBC- ok w/ Hg= 12.3;  TSH=1.60 ~  01/2014=> She is c/o a jitteriness/ shaking on the inside feeling and we discussed trying a regular medication to try to help elim this sensation> try KLONOPIN 0.3m- 1/2 to 1 tab Bid (she never filled the Klonopin and refuses  anxiolytic meds).  ~  April 13, 2014:  260moOV & PaFraser Dinas had numerous medical follow up visits over the last 48m63mo  She saw GI 12/15 w/ lower abd pain & nausea similar to prev bouts of diverticulitis in past;  CT Abd&Pelvis showed mild sigmoid diverticulitis, no evid of abscess or complications;  She was treated w/ Cipro & Flagyl & improved;  She is maintained on Prilosec, Anaspaz, fiber & metamucil...     She saw Neuro, [name] 1/16 due to headaches> long hx migraines, recurrent prob since 2014, now 15/30 days per month, CT Brain was neg x atrophy & sm vessel dis, MRI Cspine w/ multilevel DDD etc; treated w/ Tramadol, Flexeril, Zanaflex and [name] has ordered a Sleep Study- still pending & sched at end of this month...     She also saw DrNishan for CARDS f/u 1/16> hx CAD, CABG 2011, HBP, HLD; Adm 10/15 w/ HBP & hyponatremia (Na=119), HCTZ was stopped, BP meds adjusted & now controlled on Metoprolol50Tid, Losar100-1/2Tid, Apres50-1/2Tid; BP today= 136/80, improved & she denies recent CP, palpit, dizzy, ch in SOB, edema;  2DEcho 11/15 showed norm LVF w/ EF=60-65% & no regional wall motion abn, trivial AI, otherw wnl; Lexiscan Myoview 11/15 showed low risk scan w/ abn EKG portion but no ischemic perfusion defects & hyperdynamic LV w/ EF=70% & no wall motion abn... We reviewed prob list, meds, xrays and labs> see below for updates >> we gave her the PREVNAR-13 today... [facility] she will continue her same meds and plan f/u in ~52mo8mofasting blood work at that time...   ~  May 03, 2014:  3wk ROV & pt called requesting urgent add-on appt for recurrent BP problems> Pt notes that BP has been well controlled on her regimen of low sodium, Metoprolol50Tid, Hydralazine50-1/2Tid, and Losartan100-1/2Tid> until 3d ago when she had the onset of another severe HA- assoc w/ the severe pain she noted that her BP was once again elevated (190-230/ 100-110 she says) & the usual  doses of the 3 meds didn't bring it down; she tried taking the Tramadol50 as directed by [name] & added Tylenol plus her musc relaxers Flexeril & Zanaflex but the HA persisted; when she called [name]'s office she was directed to call us f[place] an appt because of the BP... NOTE> it has been determined by her Cardiologist DrNishan & I concur that in her case it's the HA PAIN that causes her BP to spike, and relief of the pain is paramount to controlling the BP in these instances...  In this regard she is directed to maintain a baseline BP regimen w/ Metoprolol50Tid, Apres50-1/2Tid & Losar100-1/2Tid;  But when she has a HA she will immediately start her  Neurology action plan for her severe headaches, and check her BP Tid w/ adjustment of her meds up to one of each tab Tid until the HA is resolved, then return to her baseline regimen... We reviewed this plan in detail & she will let us know if she has any questions...   CXR 3/16 showed norm heart size, s/p CABG, clear lungs, NAD.Marland KitchenMarland Kitchen   EKG 3/16 showed SBrady, rate53, NSSTTWA, NAD... PLAN>> continue baseline BP med regimen w/ METOPROLOL50Tid, APRESOLINE50-1/2Tid & LOSARTAN100-1/2Tid; continue to monitor BP at home; during episodes of severe HA pain- follow Neurologists HA action plan for control of the discomfort; in addition start to monitor BP 3 times daily & increase BP meds to 1 of each Tid until the HA is controlled & the BP returns to normal; then resume the baseline regimen...  ~  August 16, 2014:  3-337moROV & [name] Dinhas continued w/ her regular array of medical specialist follow up visits> SEE BELOW    Asthma & allergies controlled on Allegra & Nasonex, no asthma attacks etc...    BP is doing better on current Metop50Tid, Losar100-1/2Tid, Apres50-1/2Tid w/o any elev readings at home...     She has had several follow up visits w/ GI> hx GERD, Divertics, IBS, Polyps> last bout of diverticulitis was 12/15 treated w/ Cipro & Flagyl; she recurrent pain 3/16- seen in  ER where CT was neg but she improved w/ Antibiotic rx; she continues on Omep & they added Ranitadine; she's been on Levsin & they added benefiber; she had Ba swallow (neg), EGD (wnl) & Colon (divertics and 2 sessile polyps) by DrStark...     She saw her oncologist, DrMagrinat 6/16 for f/u of her breast cancer> Dx 10/12 & treated w/ lumpectomy & sentinel node bx, followed by XRT, the Femara for a planned 589yr(2 more yrs to go); they follow her BMDs etc...     She has a small cyst on her right index finger & I have suggested an Ortho/Hand eval, "I might want to see Rheumatology again" she says...    She's had an extensive Neuro eval from [name] & had a Neurology 2nd opinion [facility] [place]-  "it's my neck" and she states nothing new was discovered "they didn't find anything"; she has Tramadol & Tylenol for pain... We reviewed prob list, meds, xrays and labs> see below for updates >>   Barium Swallow was neg- norm motility, no HH, etc...  EGD 5/16 by DrStark showed essentially normal endoscopy; rec to continue antireflux regimen & PPI Rx...  Colonoscopy 5/16 by DrStark showed diverticulosis, 2 sessile polyps (one tub adenoma, the other hyperplastic)...  LABS 6/16: Chems- ok x BS=147;  CBC- wnl;   PLAN>>  She is rec to continue her current med regimen and follow up w/ her specialists...  ~  December 20, 2014:  37m86moV & PatFraser Dindicates that she is the same, maintaining her regular f/u visits w/ various medical specialists>    Asthma & allergies controlled on Allegra & Nasonex, no asthma attacks etc & breathing is OK, she's been walking for exercise...    BP is doing better on current Metop50Tid, Losar100-1/2Tid, Apres50-1/2Tid w/o any elev readings at home; she saw DrNishan for Cards 8/16> HBP, CAD, CABG 2011, HL; nonischemic myoview2015, BP controlled, intol to statins.     She has had several follow up visits w/ GI> hx GERD, Divertics, IBS, Polyps> on Omep40,  Zantac300, Anaspaz0.125 prn; last bout of  diverticulitis was 12/15 treated w/ Cipro & Flagyl; she had recurrent pain 3/16- seen in ER where CT was neg but she improved w/ Antibiotic rx; she had Ba swallow (neg), EGD (wnl) & Colon (divertics and 2 sessile polyps) by DrStark...     She saw her oncologist, DrMagrinat 6/16 for f/u of her breast cancer> Dx 10/12 & treated w/ lumpectomy & sentinel node bx, followed by XRT, the Femara for a planned 50yr (2 more yrs to go); they follow her BMDs etc...     She had routine GYN check by PatGrubb 7/16> note reviewed...    She has a small cyst on her right index finger & I have suggested an Ortho/Hand eval, "I might want to see Rheumatology again" she says...    She's had an extensive Neuro eval from [name] & had a Neurology 2nd opinion [facility]16 [facility] [place]-  "it's my neck" and she states nothing new was discovered "they didn't find anything"; she has Tramadol & Tylenol for pain=> f/u [name] 8/16 & offered occipital nerve blocks; she [place]> note reviewed. Chr daily HAs (occip neuralgia vs cervicogenic HAs), rec occip nerve blocks & PT, Gabapentin and f/u planned.    She also saw DrAthar for a Sleep Eval split night study 04/25/14 w/ AHI=16/hr w/ desat to 85% reported; she was titrated to CPAP 6cm H2O w/ AHI=0 and O2sat nadir=96%, snoring was eliminated; download from 8/16 looked good but she reported some mask issues which they are working on; she notes feeling sl better on the days that she wears the CPAP but HAs are about the same... We reviewed prob list, meds, xrays and labs> see below for updates >> given 2016 FLU vaccine today...  Vit D level 7/16 reported at 29 & she is rec to increase Vit D supplement to ~2000u daily...  [facility]  Lowest Tscore = -1.8 in TotalSpine and -1.6 [facility].. IMP/PLAN>>  [name] Dinis stable on her current regimen; multisystem disease w/ numerous  specialists tending to her needs and their systems; she has had several opinions from Neurologists regarding her chr daily HAs and cervicogenic HAs, she will f/u w/ DrTat regarding treatment options...   ~  June 20, 2015:  671moOV & PaFraser [name] to have mult somatic complaints- weakness, not feeling well, chest pressure, cough w/ thick white sput, occas wheezing noted, and increased anxiety/jittery (esp w/ the Ventolin prescribed by drVanWinkle);  She states that her BP has been low & therefore she doesn't take all of her meds everyday, but this makes it hard for usKoreao adjust Rx since she does not keep detailed records of BP & wheat she is taking & when... We reviewed her interval specialist visits>>    Allergy- DrVanwinkle 04/27/15>  AR, asthma, GERD- on immunotherapy for >[id number], flonase, Nasonex, Saline, VentolinHFA, Omeprazole; FENO=27ppb & pt rec to f/u 57yr34yr   Cards- DrNishan 03/10/15>  HBP, CAD, CABG 2011, HL (intol to statins & refuses Rx);  She was Adm over halloween w/ hypertensive emergency (SBP>220) and hyponatremia (Na=119), HCT was stopped & she was suspected of taking pseudophed, Myoview & Echo were neg, mult med adjustments over that time-  Appears stable on ASA81, Metop50Tid, Amlod5, Losar100, Apres50-1/2Tid; BP today= 140/70 & she is doing better overall...     GI- JZehr 05/16/15>  Abd pain, heartburn, bloating, diarrhea; felt to have functional pain & IBS; EGD 5/16  was wnl; Colon 5/16 w/ mod divertics, & tub adenoma removed; on Omep40 (changed to Protonix40), Zantac300, rec to add Probiotic daily & use Levsin prn...    She remains on Femara 2.72m/d from DrMagrinat- last seen 07/2014 7 note reviewed...    Rheum eval 2014 by DKathlynn Grate neck pain w/ NS consult DrHirsh as well- MRI CSpine showed multilevel DDD & some foraminal stenosis, not a surg cand; asked to f/u prn...    Outpt Rehab- mult visits (11) and disch 05/10/15>  Neck pain, musc spasm, decr ROM Cspine, abn posture; goals  met & pt improved...    Neuro- DrJaffe 02/28/15>  Occipital neuralgia, given occipital nerve block w/ Maracaine & Kenalog...     DrAthar 04/26/15>  OSA on CPAP- good compliance documented, AHI on CPAP<1 on CPAP 6 w/ min leak; she [place]..  DrAquino 05/24/15>  F/u chr daily HAs, pt says occip nerve blocks w/o benefit, but she reports that PT helped (esp dry needling) & rec to continue home exercise program. EXAM shows Afeb, VSS, O2sat=96% on RA;  HEENT- neg, mallampati2;  Chest- clear w/o w/r/r;  Heart- RR Gr1/6SEM w/o r/g;  Abd- soft, nontender, norm BS, neg;  Ext- w/o c/c/e;  Neuro- sl decr ROM neck, +trigger pts, otherw neg.  CXR 06/20/15>  Borderline heart size, s/p CABGsl hyperinflation, clear lungs- NAD...  Spirometry 06/20/15>  FVC=1.78 (90%), FEV1=1.26 (84%), %1sec=71%, mid-flows were sl reduced at 59% predicted...   LABS 06/20/15>  Chems- wnl;  CBC- wnl;  TSH=1.30... IMP/PLAN>>  [name] Dinis well attended by her mult specialists and is stable to improved overall;  She is rec to continue her current meds, get plenty of exercise, and avoid stress... we plan recheck in 625mo.  ~  December 20, 2015:  44m58moV & PatFraser Dintes that her BP drops on occas down to 110/50 w/ pulse in the 50's & she will adjust her own meds if this occurs; regular meds include Amlodipine5, Metop50Tid, Cozaar100-1/2Tid, Hydralazine50-1/2Tid & she likes it this way so she can freq check BP 7 adjust as noted which empowers her sense of control over her health status; she lists MULT allergies/ drug sensitivities (12+listed)  including Hct (developed hyponatremia) & ACE inhibitors;  BP today is 140/76 & we rec continue same regimen...    She continues to see Allegy- DrVanWinkle yearly, seen 04/2015 w/ mild intermit asthma, AR, GERD;  FeNO=27, rec to continue the rescue inhaler as needed, Allegra, flonase, saline & Omeprazole; he thanked me for allowing him to participate in her care...     She had CARDS f/u 10/11/15 by  DrNishan> HBP, Hx CAD, s/p CABG 2011, HL; non-ischemic myview in 2015- continue same meds...    She was recently seen in the ER 12/08/15 for Abd pain- epig area, awoke her at 4am w/ burning pain, sl nausea, hx gastritis, norm CT Abd in 2016;  Exam reported as neg- nontender;  Labs were OK;  EKG showed NSSTTWA;  Abd Sonar was normal as well;  She was given Zofran, MS, and Pepcid- improved;  Disch on her PPI rx, Carafate Qid, and Percocet5 prn...    She had a yearly GYN eval by DrAmundson & PatEdman Circle6/17- note reviewed, hx urethral prolapse, & rec to take spectrum Organic coconut oil EXAM shows Afeb, VSS, O2sat=97% on RA;  HEENT- neg, mallampati2;  Chest- clear w/o w/r/r;  Heart- RR Gr1/6SEM w/o r/g;  Abd- soft, nontender, norm BS, neg;  Ext- w/o c/c/e;  Neuro-  sl decr ROM neck, +trigger pts, otherw neg.  LABS 11/2015 in epic reviewed>  Chems- wnl, BS=112;  CBC- wnl IMP/PLAN>>  Mult medical problems as listed- BP is contolled on her regimen and she monitors her BP regularly;  Cardiac stable & we discussed exercise program;  Cryptic abd discomfort, ER eval neg, no recurrence... Given Flu shot today & we plan ROV 50mo   ~  June 19, 2016:  638moV & [name] indicates "doing pretty good";  She has not had any resp exacerbations & hasn't had to use her rescue inhaler, breathing well but she notes occas cough, chunks of sput, DOE w/ walking; she denies CP/palpit/ edema/ etc... We reviewed the following medical problems during today's office visit >>     AR/ Asthma>  On Albut-HFA as needed & Allegra180/ Nasonex; denies cough, sput, ch in dyspnea, etc; so far no allergy or resp exacerbation...    OSA on CPAP per DrAthar>  Sleep study 03/2014 w/ AHI=16, nadir O2sat=85%, & PLMS=62/hr but min arousals; placed on CPAP & titrated to 6cSevern/ resolution of snoring & hypoxemia,   CPAP download 03/2016 w/ excellent compliance w/ CPAP=6, low leak & AHI=0.5/hr...    HBP>  on Metop50Tid, Amlod5, Losar100-1/2Tid,  Apres50-1/2Tid; BP=120/62 today; denies visual changes, CP, palipit, dizziness, syncope, dyspnea, edema, etc; she freq adjusts meds on her own based on home BP readings...     CAD>  On ASA81 + above meds; CP 11/11=> cath w/ in-stent restenosis, then CABG x3 by DrGearhardt; re-hospitalized 6/12 by Cards for CP> cath showed severe 3 vessel CAD w/ 3/3 grafts patent & norm LVF w/ EF= 65-70%; Myoview 5/13 showed no ischemia & EF>80%; Imdur30 stopped for HAs;  DrNishan noted that med rx is lim by side effects- Last seen 03/2016 doing satis & no changes made...    Hyperlipid>  Intol to statins; [facility] her best FLP readings on Cres10-M&Th but she eventually stopped this too due to perceived side effects; on diet alone FLP 06/2016 shows TChol 230, TG 286, HDL 53, LDL 140 and she is asked to again reconsider a very very low dose of Crestor or perhaps a non-statin medication alternative...    GI- GERD, "hypercontractile esoph", Divertics, IBS> prev followed by DrPatterson & seen 9/13 f/u for ?diverticulitis- improved after Cipro/Flagyl but CTAbd was neg x diverticulosis; he rec high fiber diet & Citrucel; now followed by DrStark on Protonix40, Zantac300 & Carafate-1gmQid; he rec IBgard medical food for her IBS and FDgard for her functional dyspepsia    Hx left breast cancer> she had f/u Oncology-DrMagrinat, last seen 11/2015 for her estrogen receptor positive breast cancer; she inished Letravole in [id number]& discharge from Oncology f/u;  [facility] well...     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 EXAM shows Afeb, VSS, O2sat=95% on RA;  HEENT- neg, mallampati2;  Chest-  clear w/o w/r/r;  Heart- RR Gr1/6SEM w/o r/g;  Abd- soft, nontender, norm BS, neg;  Ext- w/o c/c/e;  Neuro- sl decr ROM neck, +trigger pts, otherw neg.  LABS 05/2016>  FLP- not at goals on diet alone (see above);  Chems-  wnl, Na=134;  CBC- wnl;  TSH=2.10;  VitD=40 & rec to take OTC VitD supplement ~2000u daily... IMP/PLAN>>  [name] is overall stable on her current regimen;  She will continue same meds, continue nightly CPAP, continue to exercise daily, etc... She will maintain f/u w/ DrStark for GI, and DrAquino for Neuro;  She will reconsider getting on a med for her Chol- offered very very low dose Crestor vs a non-statin & she will think about it & let me know, in the meanwhile we reviewed diet & exercise... we plan ROV in 14mo           Problem List:  GLAUCOMA (ICD-365.9) - she is sched for right cataract surg by DrBevis in Feb...  ALLERGIC RHINITIS (ICD-477.9) - she uses Allegra & Nasonex Prn; on Allergy shots weekly x yrs... ~  [facility]=> she remains on allergy shots...  ASTHMA & Asthmatic Bronchitis >> off Symbicort & using XOPENEX HFA as needed rescue inhaler... ~  5/11:  notes incr chest symptoms in the heat & rec to take the Symbicort 2sp Bid... ~  2012:  After her CABG & repeat hosp by Cards she is noted to be off her inhalers... ~  10/12:  Treated w/ Zpak, Pred, Mucinex, plus her Symbicort80 & improved... ~  CXR 5/13 showed s/p CABG, clear lungs, NAD (she went to the Er w/ cough & had neg eval). ~  7/13:  DrVanWinkle stopped her Symbicort & switched to [facility]HFA as needed... ~  2/14: she does not list any inhaled meds at present & states breathing OK w/o exac... ~  4/14: she was hosp after pill asp w/ asthma exac & improved w/ supportive Rx, didn't require bronch, CXR was clear, grad improved post hosp... ~  CXR 4/14 showed normal heart size, s/p CABG, sl tort Ao, bilat apic pleural scarring, NAD...  ~  6/15:  she is stable w/o asthma exac and has avoided URIs/ bronchitis/ etc... ~  10/15: presented w/ URI/ bronchitic exac w/ cough, yellow-green sput, congestion, hoarseness, etc; CXR w/o signs of pneumonia & treated w/ Depo, Levaquin, Mucinex, MMW, Delsym, etc...  ~  CXR 10/15 showed few chr changes in lungs, norm heart size & s/p CABG, DJD in spine, NAD  HYPERTENSION (ICD-401.9) - controlled on TOPROL 549mid & HYZAAR 100-12.5 daily (off Lisinopril due to ACE cough)...  ~  8/12: BP=  128/84 & tol meds well; denies HA, fatigue, visual changes, CP, palipit, syncope, dyspnea, edema, etc... ~  2/13: BP= 140/64 & she remains essentially asymptomatic on Rx... ~  8/13:  BP= 132/64 & she denies CP, palpit, SOB, edema, etc... ~  2/14: controlled on Toprol & Hyzaar w/ BP 144/72 today; denies visual changes, CP, palipit, dizziness, syncope, dyspnea, edema, etc... ~  5/14:  BP= 136/60 & she remains stable... ~  8/14:  BP= 134/60 and she denies CP, palpit, dizzy, SOB, edema... ~  12/14: on Metop50Bid, Hyzaar 100-12.5, Imdur30;  BP= 140/80 & she remains largely asymptomatic... ~  6/15: on ASA81, Metop50Bid, Hyzaar100-12.5, Imdur30; BP today= 136/68 & she denies CP, palpit, SOB, dizzy, edema, etc... ~  10/15: on same meds w/ BP= 160/78 & no changes meds- advised to monitor BP at home once she's over the URI illness, take meds regualrly... ~  11/15: post Hosp for HBP, hyponatremia; they stopped diuretic & disch on Metop50Bid & Losar50; she went back to ER the next day w/ BP~200 (  hadn't taken her Losartan) & given IV Hydralazine w/ improvement to 160s; we decided to continue the BBlocker, increase the ARB to 15m/d, and ADD HYDRALAZINE50- 1/2Bid for now; ROV 2 weeks... ~  11/15: she is s/p ER, Hosp, Cards visits & mult phone calls (see above)> meds ajusted- take Metop50Tid, Losartan100-1/2Tid, Apresoline50- 1/2 to 1tab Tid (she wants to do this since BP is low in AM & she feels weak, with BP elev in PM & she needs  more)... ~  2/16: BP meds adjusted over the last few months & BP controlled on Metoprolol50Tid, Losar100-1/2Tid, Apres50-1/2Tid; BP today= 136/80, improved & she denies recent CP, palpit, dizzy, ch in SOB, edema... ~  3/16: pt presented w/ BP elev during recurrent bouts of HA pain- asked to increase her above meds to Metop50/ Losar100/ Apres50- at her Tid dosing intervals when needed for HBP while her HA meds are kicking in to resolve her pain; if her pain doesn't resolve & her BP does not improve, then she knows to go to the ER for immediate assessment...   CORONARY ARTERY DISEASE (ICD-414.00) - on ASA 859md (intol to 325 she says) & off Plavix per DrCherly Hensen.  Cardiolite 9/08 showed CP & HBP response, EF+85%... had cath 10/08 w/ single vessel dis w/ mod ostial LAD stenosis and patent LAD stent, normal LVF...Marland Kitchenoption for med Rx vs off pump LIMA... she notes some CP/ pressure "burning" when walking up a certain hill during her daily walks, but states this is stable and "no worse"... ~  Myoview 9/09 was neg- no scar or ischemia, EF= 89%, hypertensive response- Lisinopril10 added. ~  f/u Myoview 3/11 was neg- no scar or ischemia, EF= 86%, norm BP response, fair exerc capacity. ~  saw DrCherly Hensen/11- no CP, he stopped her Plavix, stable- continue other meds same. ~  She had recurrent CP 11/11 w/ in-stent restenosis on cath> subseq 3 vessel CABG by DrGearhart & doing well since then... ~  6/12:  Hosp w/ CP & repeat cath showed CAD & 3/3 grafts patent, EF= 65-70% ~  Myoview 5/13 was felt to be a normal stress nuclear study- normal wall motion, no ischemia, EF=84% ~  2/14: CP 11/11=> cath w/ in-stent restenosis, then CABG x3 by DrGearhardt; re-hospitalized 6/12 by [facility] for CP> cath showed severe 3 vessel CAD w/ 3/3 grafts patent & norm LVF w/ EF= 65-70%; Myoview 5/13 showed no ischemia & EF>80%; no change in meds; & DrCherly Hensenoted that med rx is limited by side effects... ~  8/14 & 12/14: she remains stable on  Imdur30 + Metop50Bid 7 Hyzaar100-12.5; denies CP, palpit, SOB, etc... ~  6/15: [facility] f/u w/ DrNishan 1/15> HBP, CAD, HL; on ASA81, Metop50Bid, Hyzaar100-12.5, Imdur30; Hx neg Myoview 5/13 & doing satis- no changes made; BP today= 136/68 & she denies CP, palpit, SOB, dizzy, edema, etc ~  EKG 11/15 showed NSR, rate69, NSSTTWA, NAD  ~  2DEcho 11/15 showed  norm LVF w/ EF=60-65% & no regional wall motion abn, trivial AI, otherw wnl. ~  Myoview 11/15 showed low risk scan w/ abn EKG portion but no stress induced perfusion defects, hyperdynamic LVF w/ EF>70% & no regional wall motion abn. ~  11/15: she is off the Imdur per DrNishan due to HAs... ~  1/16: she had f/u DrNishan> stable on meds and no changes made...  HYPERLIPIDEMIA (ICD-272.4) - on CRESTOR 102m1/2 on M&F, FISH OIL 1200m57md + Flax Seed Oil... she has been intol to statins in the past>  tried low dose Trilipix but stopped this due to "thigh burning">  now followed in the [facility]--- ~  [place] 7/08 showed TChol 217, TG 236, HDL 46, LDL 150...  ~  [place] 6/09 showed TChol 233, TG 263, HDL 46, LDL 150... she agrees to try LOW DOSE Trilipix. ~  FLP 11/09 showed TChol 162, TG 167, HDL 45, LDL 83... ~  [place] 11/10 showed TChol 240, TG 271, HDL 45, LDL 150... LipClin Rx w/ FishOil & FlaxSeedOil. ~  FLP 2/11 showed TChol 285, TG 156, HDL 79, LDL 180... LC is aware & working w/ her regularly. ~  [place] 5/11 showed TChol 277, TG 241, HDL55, LDL 193...  LC added Cres10 just 2d per week. ~  FLP 10/11 showed TChol 198, TG 215, HDL 53, LDL 109... much improved on Cres10 twice weekly. ~  She continues to f/u w/ Lipid clinic & they titrated up the Cres10 on M&F, plus 40m on Wednes> ~  FLP  10/12 was the best yet w/ TChol 167, TG 203, HDL 54, LDL 88...  ~  [place]4/13 showed TChol 193, TG 176, HDL 57, LDL 101 ~  [facility] (at that time on Cres10 on M&F, 541mon W) but she subseq cut herself down to 1/2 on M&F due to nausea &  aching. ~  FLP 2/14 on diet alone showed TChol 194, TG 143, HDL 55, LDL 110... We reviewed diet, exercise, etc... ~  She remains off the [place] diet alone and she declines to restart low dose rx "I can't tolerate them"  GERD (ICD-530.81) - on OMEPRAZOLE 4032m... last EGD by DrPatterson 8/07 revealed sm HH, & gastritis and dilatation done... (RUT=neg, PPI Rx)... LEVSIN 0.125m67mlps her esoph spasm... Prn Phenergan for nausea.  DIVERTICULOSIS, COLON (ICD-562.10) & IRRITABLE BOWEL SYNDROME (ICD-564.1) ~  colonoscopy 2/03 by DrPatterson showed divertics and 5 mm polyp... ~  colonoscopy 3/11 showed divertics & cecal polyp= tubular adenoma, w/ f/u planned 382yrs380yr 9/13: seen by DrPatterson f/u for ?diverticulitis- improved after Cipro/Flagyl but CTAbd was neg x diverticulosis; he rec high fiber diet & Citrucel...  ? GALLSTONES>>  Diagnosed by DrPatterson and referred to [facility] DrWilson, pt asked to call prn fopr incr symptoms to consider surg... ~  Abd Ultrasound 10/13 by DrNisCherly Henseneval CP showed> norm GB, liver, kidneys, & Ao- neg sonar.  BREAST CANCER >> Abn mammogram 10/12 showing a left breast nodule & subseq surg (partial mastectomy & sentinel node bx) by [name] 12/12 that proved to be an invasive ductal carcinoma, 2.3cm size, w/ neg lymphovasc invasion, clear margins (but close at 1mm),86mneg nodes; she had XRT by DrKindard (had her 11th treatment today); and Oncology eval by DrMagrinat who plans hormone Rx later (no chemo she says)...  ~  2/13: She is tolerating treatment well & spirits are up, [name] had to aspirate a seroma & it is now resolved... ~  She is followed by DrMagrinat, StreckMargot Chimesrd- [facility]382[facility] pl74y[facility]... ~  6/14:  She had f/u DrMagrinat 6/14> his note is reviewed- s/p lumpectomy, sentinel node, XRT; plan is to continue Femara2.5mg thru62m16 for 382yrs rx. 36yr0/14:  She had routine f/u [name] 10/14> Hx left breast cancer dx 10/12 & treated w/ lumpectomy,  XRT, & now Femara; exam neg, f/u mammogram neg, rec f/u 80yr. ~  6/17yr She saw Oncology for f/u of her left Breast Cancer>  on Femara2.5 & tol well, s/p lumpectomy & sentinel node bx 12/12, followed by XRT finished 3/13, & on Letrozole since then, no known recurrence...   DJD, Neck Pain >> ~  XRays Cspine 4/14 in hosp showed multilevel cerv spondylosis, 1-10m anterolisthesis C4 on C5, DDD, foraminal stenosis at C5-6...  ~  5/14:  MRI Cspine => multilevel facet hypertrophy, foraminal narrowing & stenosis, disc osteophyte complexes, some central canal stenosis=> she was sent to NS, DrHirsh who rec Mobic & PT (helped alittle) but she was not pleased... ~  8/14:  She is requesting a Rheumatology 2nd opinion about her neck discomfort=> [facility] this exac HAs/migraines, dizzy/ light headed; MRI=> NS consult & not a surg cand & NSAIDs/ PT w/ min benefit; saw Neurology- tried Pamelor but feels hung-over; for her DDD & OA they rec Flex534mid which has helped some...  FIBROMYALGIA (ICD-729.1) - she c/o chr fatigue, aching/ sore, on Tramadol & Tylenol, but most benefit from low dose Hydrocodone ~1/2 tab Prn... I have recommended an increase exercise program to her... ~  5/11:  Vit D level = 50 on 50000 u weekly by PaHelene ShoeNP  HEADACHE (ICD-784.0) [facility], but she notes Rx didn't help... ~  3/11:  presented w/ muscle contraction HA's> Rx Tramadol 5039m6H Prn. ~  She has persistent/ recurrent HAs... ~  11/15: her chronic daily HAs have again risen in improtance due to interaction w/ her HBP> asked to f/u w/ Neuro ASAP & Rx w/ rest, heating pad, Tramadol50/ Tylenol, and musc relaxer- Zanaflex2Tid vs Flexeril5Tid...  DIZZINESS, CHRONIC (ICD-780.4) - MRI 2/09 per DrNCherly Hensenowed atrophy, sm vessel dis, NAD...  Health Maintenance: ~  GI:  DrPatterson & up to date on colon screening. ~  GYN:  yearly f/u w/ PatHelene ShoeP for PAP, [facility]. ~  Immunizations:  yearly Flu vaccines in fall, Pneumovax in 2007?, TDAP given 5/11...   Past Surgical History:  Procedure Laterality Date  . 24 CabellUDY N/A 01/08/2016   Procedure: 24 FollansbeeUDY;  Surgeon: MalLadene ArtistD;  Location: [facility] END[facility]COPY;  Service: Endoscopy;  Laterality: N/A;  . BREAST LUMPECTOMY Left 02/06/11  . CATARACT EXTRACTION, BILATERAL     bilateral caaract removal,  . CORONARY ANGIOPLASTY WITH STENT PLACEMENT     Stent 2007  . CORONARY ARTERY BYPASS GRAFT    . ESOPHAGEAL MANOMETRY N/A 01/08/2016   Procedure: ESOPHAGEAL MANOMETRY (EM);  Surgeon: MalLadene ArtistD;  Location: [facility] END[facility];  Service: Endoscopy;  Laterality: N/A;  . open heart surgery     01/19/2010    Outpatient Encounter Prescriptions as of 06/19/2016  Medication Sig  . Acetaminophen (TYLENOL 8 HOUR PO) Take by mouth as directed.   . aMarland Kitchenbuterol (VENTOLIN HFA) 108 (90 Base) MCG/ACT inhaler Inhale 2 puffs into the lungs every 6 (six) hours as needed for wheezing or shortness of breath.  . aMarland KitchenLODipine (NORVASC) 5 MG tablet Take 1 tablet (5 mg total) by mouth daily.  . aMarland Kitchenpirin EC 81 MG tablet Take 81 mg by mouth daily.  . Calcium & Magnesium Carbonates (MYLANTA PO) Take by mouth. 1-2 teaspoons as needed  . fexofenadine (ALLEGRA) 180 MG tablet Take 180 mg by mouth daily.    . fluocinonide (LIDEX) 0.05 % external solution Apply 1 application topically daily as needed (scalp itching).   . Glucosamine-Chondroit-Vit C-Mn (GLUCOSAMINE CHONDR 1500 COMPLX PO) Take 1 tablet by mouth  3 (three) times daily.    . hydrALAZINE (APRESOLINE) 50 MG tablet take 1/2 tablet by mouth three times a day  . ketoconazole (NIZORAL) 2 % shampoo Apply 1 application topically 2 (two) times a week. As needed for itching a couple of times week  . latanoprost (XALATAN) 0.005 % ophthalmic solution Place 1 drop into both eyes at bedtime.   [name] losartan (COZAAR) 100 MG tablet take 1/2 tablet by mouth three times a day  .  magnesium oxide (MAG-OX) 400 MG tablet Take 400 mg by mouth daily. Reported on 02/14/2015  . metoprolol (LOPRESSOR) 50 MG tablet take 1 tablet by mouth three times a day  . mometasone (NASONEX) 50 MCG/ACT nasal spray Place 2 sprays into the nose daily as needed (congestion).   . nitroGLYCERIN (NITROSTAT) 0.4 MG SL tablet Place 1 tablet (0.4 mg total) under the tongue every 5 (five) minutes as needed for chest pain.  . Omega-3 Fatty Acids (FISH OIL) 1200 MG CAPS Take 1,200 mg by mouth daily. Reported on 02/14/2015  . pantoprazole (PROTONIX) 40 MG tablet Take 1 tablet (40 mg total) by mouth daily.  . Psyllium (METAMUCIL FIBER PO) Take 1 tablet by mouth daily.   . ranitidine (ZANTAC) 300 MG capsule Take 1 capsule (300 mg total) by mouth every evening.  . sucralfate (CARAFATE) 1 GM/10ML suspension Take 10 mLs (1 g total) by mouth 4 (four) times daily.  . traMADol (ULTRAM) 50 MG tablet Take 1/2 to 1 tablet by mouth three times daily as needed  . vitamin B-12 (CYANOCOBALAMIN) 1000 MCG tablet Take 1,000 mcg by mouth daily.    . [DISCONTINUED] traMADol (ULTRAM) 50 MG tablet Take 1/2 to 1 tablet by mouth three times daily as needed   No facility-administered encounter medications on file as of 06/19/2016.     Allergies  Allergen Reactions  . Choline Fenofibrate Other (See Comments)     pt states INTOL to Trilipix w/ "thigh burning"  . Simvastatin Other (See Comments)     pt states INTOL to STATINS \T\ refuses to restart  . Bentyl [Dicyclomine Hcl]     Made me feel weird and drained me  . Hctz [Hydrochlorothiazide]     Causes hyponatremia  . Hydrocodone     Nightmare after taking cough syrup w/hydrocodone  . Levaquin [Levofloxacin In D5w]     Elevated BP  . Adhesive [Tape] Rash  . Ceclor [Cefaclor] Rash  . Clarithromycin Rash  . Codeine Nausea Only  . Doxycycline Rash  . Lisinopril Cough    Developed ACE cough...  . Penicillins Itching and Rash    At injection site  .  Tobramycin-Dexamethasone Rash    Immunization History  Administered Date(s) Administered  . Influenza Split 12/07/2010, 11/29/2011, 11/25/2012, 11/25/2013  . Influenza Whole 12/19/2005, 11/25/2007, 12/08/2008, 12/20/2009  . Influenza, High Dose Seasonal PF 12/20/2015  . Influenza,inj,Quad PF,36+ Mos 12/20/2014  . Pneumococcal Conjugate-13 04/13/2014  . Pneumococcal Polysaccharide-23 02/25/2005  . Td 07/25/2009  . Zoster 03/08/2013    Current Medications, Allergies, Past Medical History, Past Surgical History, Family History, and Social History were reviewed in [employer] record.    Review of Systems    See HPI - all other systems neg except as noted...  The patient complains of headaches.  The patient denies anorexia, fever, weight loss, weight gain, vision loss, decreased hearing, hoarseness, chest pain, syncope, dyspnea on exertion, peripheral edema, prolonged cough, hemoptysis, abdominal pain, melena, hematochezia, severe indigestion/heartburn, hematuria, incontinence, muscle weakness, suspicious  skin lesions, transient blindness, difficulty walking, depression, unusual weight change, abnormal bleeding, enlarged lymph nodes, and angioedema.   Objective:   Physical Exam    WD, WN, 81 y/o WF in NAD... GENERAL:  Alert & oriented; pleasant & cooperative... HEENT:  [place]/AT, EOM-wnl, PERRLA, EACs-clear, TMs-wnl, NOSE-clear, THROAT- clear & wnl. NECK:  Decr ROM; no JVD; normal carotid impulses w/o bruits; no thyromegaly or nodules palpated; no lymphadenopathy. CHEST:  Clear- no wheezing, rales, or signs of consolidation... HEART:  Regular Rhythm; without murmurs/ rubs/ or gallops detected... ABDOMEN:  Soft & nontender; normal bowel sounds; no organomegaly or masses palpated... EXT: without deformities, mild arthritic changes and +trigger points, no varicose veins/ venous insuffic/ or edema. NEURO:  CN's intact; motor testing normal; sensory testing normal; gait  normal & balance OK. DERM:  No lesions noted; no rash etc...  RADIOLOGY DATA:  Reviewed in the EPIC EMR & discussed w/ the patient...  LABORATORY DATA:  Reviewed in the EPIC EMR & discussed w/ the patient...   Assessment & Plan:    Asthma/ AR>   ~  Prev stable on allergy shots & XOPENEX as needed; no recent exac... Hx pill asp w/ asthma exac in past => resolved... ~  4/18>  [name] is overall stable on her current regimen;  She will continue same meds, continue nightly CPAP, continue to exercise daily, etc... She will maintain f/u w/ DrStark for GI, and DrAquino for Neuro;  She will reconsider getting on a med for her Chol- offered very very low dose Crestor vs a non-statin & she will think about it & let me know, in the meanwhile we reviewed diet & exercise.  HBP>  Controlled on BP med regimen of Metoprolol50Tid, Losartan100-1/2Tid, Hydralazine50-1/2Tid, & Amlod5...  CAD>  Cath w/ patent grafts; had neg Nuclear study; continue same meds and f/u DrNishan...  CHOL>  Prev followed in the Lipid Clinic & intol to all meds- on diet alone now 7 FLP not at goals; she will consider her options & let me know...  GI> GERD, Divertics, ?Gallstones>  Followed by [facility] & stones eval by DrWilson [facility]; odd that f/u sonar 9/13 did not show stones& nuclear med hepatobiliary scan was WNL.....  BREAST CANCER>  off Kiefer now after 5 yrs rx & released by DrMagrinat > needs yearly mammogram & exam, periodic BMD...  FM/ HAs/ etc>  On Ultram, Tylenol, etc => needs active management of this prob by Neuro w/ an "action plan" for severe HA episodes... NECK PAIN> with multilevel spondylosis & eval by NS, DrHirsh but she was not pleased & is asking for Rheum eval...  Other medical issues as noted> on CoQ10, Glucosamine, Vit B12, Vit D, etc...   Patient's Medications  New Prescriptions   No medications on file  Previous Medications   ACETAMINOPHEN (TYLENOL 8 HOUR PO)    Take by mouth as directed.    ALBUTEROL  (VENTOLIN HFA) 108 (90 BASE) MCG/ACT INHALER    Inhale 2 puffs into the lungs every 6 (six) hours as needed for wheezing or shortness of breath.   AMLODIPINE (NORVASC) 5 MG TABLET    Take 1 tablet (5 mg total) by mouth daily.   ASPIRIN EC 81 MG TABLET    Take 81 mg by mouth daily.   CALCIUM & MAGNESIUM CARBONATES (MYLANTA PO)    Take by mouth. 1-2 teaspoons as needed   FEXOFENADINE (ALLEGRA) 180 MG TABLET    Take 180 mg by mouth daily.     FLUOCINONIDE (  LIDEX) 0.05 % EXTERNAL SOLUTION    Apply 1 application topically daily as needed (scalp itching).    GLUCOSAMINE-CHONDROIT-VIT C-MN (GLUCOSAMINE CHONDR 1500 COMPLX PO)    Take 1 tablet by mouth 3 (three) times daily.     HYDRALAZINE (APRESOLINE) 50 MG TABLET    take 1/2 tablet by mouth three times a day   KETOCONAZOLE (NIZORAL) 2 % SHAMPOO    Apply 1 application topically 2 (two) times a week. As needed for itching a couple of times week   LATANOPROST (XALATAN) 0.005 % OPHTHALMIC SOLUTION    Place 1 drop into both eyes at bedtime.    LOSARTAN (COZAAR) 100 MG TABLET    take 1/2 tablet by mouth three times a day   MAGNESIUM OXIDE (MAG-OX) 400 MG TABLET    Take 400 mg by mouth daily. Reported on 02/14/2015   METOPROLOL (LOPRESSOR) 50 MG TABLET    take 1 tablet by mouth three times a day   MOMETASONE (NASONEX) 50 MCG/ACT NASAL SPRAY    Place 2 sprays into the nose daily as needed (congestion).    NITROGLYCERIN (NITROSTAT) 0.4 MG SL TABLET    Place 1 tablet (0.4 mg total) under the tongue every 5 (five) minutes as needed for chest pain.   OMEGA-3 FATTY ACIDS (FISH OIL) 1200 MG CAPS    Take 1,200 mg by mouth daily. Reported on 02/14/2015   PANTOPRAZOLE (PROTONIX) 40 MG TABLET    Take 1 tablet (40 mg total) by mouth daily.   PSYLLIUM (METAMUCIL FIBER PO)    Take 1 tablet by mouth daily.    RANITIDINE (ZANTAC) 300 MG CAPSULE    Take 1 capsule (300 mg total) by mouth every evening.   SUCRALFATE (CARAFATE) 1 GM/10ML SUSPENSION    Take 10 mLs (1 g total) by  mouth 4 (four) times daily.   VITAMIN B-12 (CYANOCOBALAMIN) 1000 MCG TABLET    Take 1,000 mcg by mouth daily.    Modified Medications   Modified Medication Previous Medication   TRAMADOL (ULTRAM) 50 MG TABLET traMADol (ULTRAM) 50 MG tablet      Take 1/2 to 1 tablet by mouth three times daily as needed    Take 1/2 to 1 tablet by mouth three times daily as needed  Discontinued Medications   No medications on file

## 2016-06-20 ENCOUNTER — Telehealth: Payer: Self-pay | Admitting: Pulmonary Disease

## 2016-06-20 NOTE — Telephone Encounter (Signed)
Spoke with the pt  She states tramadol was not sent to her pharm  She uses [facility]  [facility]igh did call this in yesterday  I called and spoke with the pharmacist to confirm  Pt aware and nothing further needed

## 2016-06-26 ENCOUNTER — Other Ambulatory Visit (INDEPENDENT_AMBULATORY_CARE_PROVIDER_SITE_OTHER): Payer: Medicare Other

## 2016-06-26 DIAGNOSIS — E559 Vitamin D deficiency, unspecified: Secondary | ICD-10-CM | POA: Diagnosis not present

## 2016-06-26 DIAGNOSIS — R7989 Other specified abnormal findings of blood chemistry: Secondary | ICD-10-CM | POA: Diagnosis not present

## 2016-06-26 DIAGNOSIS — E782 Mixed hyperlipidemia: Secondary | ICD-10-CM

## 2016-06-26 DIAGNOSIS — R0989 Other specified symptoms and signs involving the circulatory and respiratory systems: Secondary | ICD-10-CM | POA: Diagnosis not present

## 2016-06-26 DIAGNOSIS — F411 Generalized anxiety disorder: Secondary | ICD-10-CM

## 2016-06-26 DIAGNOSIS — I2510 Atherosclerotic heart disease of native coronary artery without angina pectoris: Secondary | ICD-10-CM | POA: Diagnosis not present

## 2016-06-26 LAB — CBC WITH DIFFERENTIAL/PLATELET
BASOS ABS: 0.1 10*3/uL (ref 0.0–0.1)
Basophils Relative: 1.1 % (ref 0.0–3.0)
Eosinophils Absolute: 0.4 10*3/uL (ref 0.0–0.7)
Eosinophils Relative: 5 % (ref 0.0–5.0)
HCT: 39 % (ref 36.0–46.0)
Hemoglobin: 13.2 g/dL (ref 12.0–15.0)
LYMPHS ABS: 3.8 10*3/uL (ref 0.7–4.0)
Lymphocytes Relative: 51.6 % — ABNORMAL HIGH (ref 12.0–46.0)
MCHC: 33.9 g/dL (ref 30.0–36.0)
MCV: 90.2 fl (ref 78.0–100.0)
MONO ABS: 0.8 10*3/uL (ref 0.1–1.0)
MONOS PCT: 10.3 % (ref 3.0–12.0)
NEUTROS ABS: 2.3 10*3/uL (ref 1.4–7.7)
NEUTROS PCT: 32 % — AB (ref 43.0–77.0)
PLATELETS: 298 10*3/uL (ref 150.0–400.0)
RBC: 4.33 Mil/uL (ref 3.87–5.11)
RDW: 14.5 % (ref 11.5–15.5)
WBC: 7.3 10*3/uL (ref 4.0–10.5)

## 2016-06-26 LAB — COMPREHENSIVE METABOLIC PANEL
ALT: 21 U/L (ref 0–35)
AST: 19 U/L (ref 0–37)
Albumin: 4.4 g/dL (ref 3.5–5.2)
Alkaline Phosphatase: 75 U/L (ref 39–117)
BILIRUBIN TOTAL: 1 mg/dL (ref 0.2–1.2)
BUN: 12 mg/dL (ref 6–23)
CALCIUM: 9.7 mg/dL (ref 8.4–10.5)
CHLORIDE: 98 meq/L (ref 96–112)
CO2: 31 meq/L (ref 19–32)
Creatinine, Ser: 0.69 mg/dL (ref 0.40–1.20)
GFR: 87.27 mL/min (ref >60.00)
GLUCOSE: 96 mg/dL (ref 70–99)
POTASSIUM: 4.7 meq/L (ref 3.5–5.1)
Sodium: 134 mEq/L — ABNORMAL LOW (ref 135–145)
Total Protein: 7.3 g/dL (ref 6.0–8.3)

## 2016-06-26 LAB — LIPID PANEL
CHOL/HDL RATIO: 4
Cholesterol: 230 mg/dL — ABNORMAL HIGH (ref 0–200)
HDL: 52.7 mg/dL (ref >39.00)
NONHDL: 177.14
TRIGLYCERIDES: 286 mg/dL — AB (ref 0.0–149.0)
VLDL: 57.2 mg/dL — AB (ref 0.0–40.0)

## 2016-06-26 LAB — LDL CHOLESTEROL, DIRECT: LDL DIRECT: 140 mg/dL

## 2016-06-26 LAB — VITAMIN D 25 HYDROXY (VIT D DEFICIENCY, FRACTURES): VITD: 39.9 ng/mL (ref 30.00–100.00)

## 2016-06-26 LAB — TSH: TSH: 2.1 u[IU]/mL (ref 0.35–4.50)

## 2016-06-28 NOTE — Progress Notes (Signed)
Called and spoke to pt. Informed her of the results and recs per SN. Pt verbalized understanding and states she wants to think about adding Crestor and will call us on 07/01/2016 if she decides she would like to add this to her med regime.

## 2016-07-01 ENCOUNTER — Other Ambulatory Visit: Payer: Self-pay

## 2016-07-01 MED ORDER — ROSUVASTATIN CALCIUM 5 MG PO TABS: 5.0000 mg | ORAL_TABLET | ORAL | 11 refills | Status: DC

## 2016-07-01 MED FILL — ROSUVASTATIN CALCIUM 5 MG T: 5 | 70 days supply | Qty: 30 | Fill #0

## 2016-07-01 NOTE — Progress Notes (Signed)
Contacted patient for Crestor follow up. Pt stated she wanted to start medication; will route to SN for dosage. She also stated she lost her low fat diet sheet. One was located via [employer]; pt stated she would like to have it. Information was printed off put in out going mail.

## 2016-07-03 DIAGNOSIS — J3089 Other allergic rhinitis: Secondary | ICD-10-CM | POA: Diagnosis not present

## 2016-07-03 DIAGNOSIS — J301 Allergic rhinitis due to pollen: Secondary | ICD-10-CM | POA: Diagnosis not present

## 2016-07-09 DIAGNOSIS — J3089 Other allergic rhinitis: Secondary | ICD-10-CM | POA: Diagnosis not present

## 2016-07-09 DIAGNOSIS — J301 Allergic rhinitis due to pollen: Secondary | ICD-10-CM | POA: Diagnosis not present

## 2016-07-15 DIAGNOSIS — J3089 Other allergic rhinitis: Secondary | ICD-10-CM | POA: Diagnosis not present

## 2016-07-15 DIAGNOSIS — J301 Allergic rhinitis due to pollen: Secondary | ICD-10-CM | POA: Diagnosis not present

## 2016-07-16 DIAGNOSIS — H40013 Open angle with borderline findings, low risk, bilateral: Secondary | ICD-10-CM | POA: Diagnosis not present

## 2016-07-25 DIAGNOSIS — J301 Allergic rhinitis due to pollen: Secondary | ICD-10-CM | POA: Diagnosis not present

## 2016-07-25 DIAGNOSIS — J3089 Other allergic rhinitis: Secondary | ICD-10-CM | POA: Diagnosis not present

## 2016-07-29 ENCOUNTER — Other Ambulatory Visit: Payer: Self-pay | Admitting: *Deleted

## 2016-07-29 ENCOUNTER — Telehealth: Payer: Self-pay | Admitting: *Deleted

## 2016-07-29 DIAGNOSIS — Z170 Estrogen receptor positive status [ER+]: Principal | ICD-10-CM

## 2016-07-29 DIAGNOSIS — I890 Lymphedema, not elsewhere classified: Secondary | ICD-10-CM

## 2016-07-29 DIAGNOSIS — C50512 Malignant neoplasm of lower-outer quadrant of left female breast: Secondary | ICD-10-CM

## 2016-07-29 NOTE — Telephone Encounter (Signed)
VM received from the patient stating " I really don't know who to call but I have developed some swelling in my left arm and hand "  Pt was last seen Oct 2017 and released from follow up per completion of therapy.  This RN returned call and discussed above. She denies any redness, focalized pain or any noted injury or bug bite. This RN informed pt this office can resource as appropriate for certain needs relating to her history of breast cancer.  Per discussion this RN placed a referral for pt to be seen at the lymphedema clinic.

## 2016-07-31 ENCOUNTER — Ambulatory Visit: Payer: Medicare Other | Attending: Oncology | Admitting: Physical Therapy

## 2016-07-31 DIAGNOSIS — M6281 Muscle weakness (generalized): Secondary | ICD-10-CM | POA: Insufficient documentation

## 2016-07-31 DIAGNOSIS — I890 Lymphedema, not elsewhere classified: Secondary | ICD-10-CM | POA: Insufficient documentation

## 2016-07-31 NOTE — Therapy (Signed)
[place], [place], [address] Phone: [phone]   Fax:  [phone]  Physical Therapy Evaluation  Patient Details  Name: [name] MRN: [id number] Date of Birth: 06-27-1935 Referring Provider: Magrinat  Encounter Date: 07/31/2016      PT End of Session - 07/31/16 1031    Visit Number 1   Number of Visits 7   Date for PT Re-Evaluation 09/11/16   PT Start Time 0848   PT Stop Time 0933   PT Time Calculation (min) 45 min   Activity Tolerance Patient tolerated treatment well   Behavior During Therapy [facility] for tasks assessed/performed      Past Medical History:  Diagnosis Date  . Asthma   . Atrophic vaginitis   . Breast cancer, Left 12/20/2010   NO BLOOD PRESSURE CHECKS OR STICKS IN LEFT ARM  . CAD (coronary artery disease)    a. 01/19/2010 s/p CABG x 3, [place]->lad, vg->diag, vg->om1;  b. 06/2011 :Lexi MV: EF 84%, No ischemia. c. 01/06/14 s/p negative nuclear stress test with EF >70%  . Cervical arthritis ([place])   . Colonic polyp 04-27-2009   tubular adenoma  . Diverticulosis of colon (without mention of hemorrhage)   . Fibromyalgia   . Gallstones   . GERD (gastroesophageal reflux disease)   . Glaucoma   . Headache(784.0)    a. frequently assocaited with high BPs  . Hiatal hernia   . Hypercholesterolemia   . Irritable bowel syndrome   . Labile hypertension     Past Surgical History:  Procedure Laterality Date  . [place] STUDY N/A 01/08/2016   Procedure: [place] STUDY;  Surgeon: Ladene Artist, MD;  Location: [facility];  Service: Endoscopy;  Laterality: N/A;  . BREAST LUMPECTOMY Left 02/06/11  . CATARACT EXTRACTION, BILATERAL     bilateral caaract removal,  . CORONARY ANGIOPLASTY WITH STENT PLACEMENT     Stent 2007  . CORONARY ARTERY BYPASS GRAFT    . ESOPHAGEAL MANOMETRY N/A 01/08/2016   Procedure: ESOPHAGEAL MANOMETRY (EM);  Surgeon: Ladene Artist, MD;  Location: [facility]  ENDOS[facility];  Service: Endoscopy;  Laterality: N/A;  . open heart surgery     01/19/2010    There were no vitals filed for this visit.       Subjective Assessment - 07/31/16 0850    Subjective Two weeks ago I woke up during the night and felt like my hand and arm were swelling. I put my arm up to try to keep it down. It has gone down some but it has not gone all the way down. The wrist area has been thicker for a long time but it never bothered me it was just my watch was tight. Pt also reports her left breast swells.    Pertinent History 2012 L breast cancer status post left lumpectomy and sentinel lymph node biopsy December of 2012 for a T2 N0, stage IIA invasive ductal carcinoma, grade 3, HER-2 not amplified, strongly estrogen and progesterone receptor positive with an elevated proliferation marker at 72%; , Feb 06, 2011- lumpectomy, completed radiation, open heart surgery in 2011 - 3 bypasses, fibromyalgia   Patient Stated Goals to get swelling down and know how to manage it   Currently in Pain? No/denies            [facility] PT Assessment - 07/31/16 0001      Assessment   Medical Diagnosis left breast cancer   Referring Provider Magrinat  Onset Date/Surgical Date 02/06/11   Hand Dominance Right   Prior Therapy physical therapy in March 2018 for neck pain pt reports 6-8 visits     Precautions   Precautions Other (comment)  LUE lymphedema     Restrictions   Weight Bearing Restrictions No     Balance Screen   Has the patient fallen in the past 6 months No   Has the patient had a decrease in activity level because of a fear of falling?  No   Is the patient reluctant to leave their home because of a fear of falling?  No     Home Social worker Private residence   Living Arrangements Spouse/significant other   Available Help at Discharge Family   Type of [place] to enter   Entrance Stairs-Number of Steps 3   [place] Two  level;Able to live on main level with bedroom/bathroom   Alternate Level Stairs-Number of Steps 14     Prior Function   Level of Independence Independent   Vocation Retired   Leisure pt reports she walks 5 days a week for 20 minutes     Cognition   Overall Cognitive Status Within Functional Limits for tasks assessed     Observation/Other Assessments   Other Surveys  --  LLIS: 16% impairment     Observation/Other Assessments-Edema    Edema --  swelling visible in left hand and wrist     ROM / Strength   AROM / PROM / Strength AROM;Strength     AROM   Overall AROM  Within functional limits for tasks performed     Strength   Overall Strength Deficits  shoulders grossly 4/5           LYMPHEDEMA/ONCOLOGY QUESTIONNAIRE - 07/31/16 0911      Type   Cancer Type left breast     Surgeries   Lumpectomy Date 02/06/11   Sentinel Lymph Node Biopsy Date 02/06/11   Number Lymph Nodes Removed --  pt reports 1 or 2     Date Lymphedema/Swelling Started   Date 07/17/16  though pt reports thickness at her wrist for longer      Treatment   Active Chemotherapy Treatment No   Past Chemotherapy Treatment No   Active Radiation Treatment No   Past Radiation Treatment Yes   Date 07/10/11   Body Site left breast   Current Hormone Treatment No   Past Hormone Therapy Yes   Date 02/26/16   Drug Name Femara     What other symptoms do you have   Are you Having Heaviness or Tightness Yes   Are you having Pain Yes   Are you having pitting edema No   Is it Hard or Difficult finding clothes that fit No   Do you have infections No   Is there Decreased scar mobility No     Lymphedema Assessments   Lymphedema Assessments Upper extremities     Right Upper Extremity Lymphedema   15 cm Proximal to Olecranon Process 29 cm   10 cm Proximal to Olecranon Process 27.5 cm   Olecranon Process 24 cm   15 cm Proximal to Ulnar Styloid Process 23.7 cm   10 cm Proximal to Ulnar Styloid Process  20.3 cm   Just Proximal to Ulnar Styloid Process 15.8 cm   Across Hand at [employer] 18.5 cm   At [place] of 2nd Digit 6.2 cm  Left Upper Extremity Lymphedema   15 cm Proximal to Olecranon Process 28.9 cm   10 cm Proximal to Olecranon Process 27 cm   Olecranon Process 25 cm   15 cm Proximal to Ulnar Styloid Process 24.1 cm   10 cm Proximal to Ulnar Styloid Process 21.6 cm   Just Proximal to Ulnar Styloid Process 16.1 cm   Across Hand at [employer] 18.6 cm   At [place] of 2nd Digit 6.1 cm         Objective measurements completed on examination: See above findings.                  PT Education - 07/31/16 1029    Education provided Yes   Education Details anatomy and physiology of lymphatic system, lymphedema risk reduction practices   Person(s) Educated Patient   Methods Explanation   Comprehension Verbalized understanding           Short Term Clinic Goals - 07/31/16 1039      CC Short Term Goal  #1   Title Patient will be able to independently verbalize lymphedema risk reduction practices.   Time 3   Period Weeks   Status New             Long Term Clinic Goals - 07/31/16 1039      CC Long Term Goal  #1   Title Patient will obtain appropriate compression garments including sleeve and glove for long term management of edema   Time 6   Period Weeks   Status New     CC Long Term Goal  #2   Title Pt will be able to perform self drainage to L UE and breast for long term management of edema   Time 6   Period Weeks   Status New     CC Long Term Goal  #3   Title Pt will be independent in a strengthening program for bilateral shoulders to return to prior level of function   Time 6   Period Weeks   Status New     CC Long Term Goal  #4   Title Pt will report a at least a 65% improvement in swelling in left upper extremity to allow increased comfort.    Time 6   Period Weeks   Status New             Plan - 07/31/16 1033     Clinical Impression Statement Patient presents to PT with left hand and UE lymphedema. She underwent a lumpectomy on 02/06/11 for treatment of left breast cancer and pt reports she had 1-2 nodes biopsied. She also completed radiation. She states her hand began swelling two weeks ago but her wrist has appeared thicker before that. Her watch is fitting tightly on her left wrist. At times she reports her left breast feels heavy but there is no noticable swelling present today. Circumferential measurements bilaterally are similar but swelling is visible especially at left hand and wrist. She is right hand dominant. She also presents with general shoulder weakness. Patient would benefit from skilled PT services to help decrease lymphedema in her left UE, assist pt with getting appropriate compression garments and issue patient exercises for shoulder strengthening.    History and Personal Factors relevant to plan of care: none   Clinical Presentation Stable   Clinical Decision Making Low   Rehab Potential Good   Clinical Impairments Affecting Rehab Potential hx of radiation   PT Frequency 1x /  week   PT Duration 6 weeks   PT Treatment/Interventions ADLs/Self Care Home Management;Therapeutic exercise;Patient/family education;Manual lymph drainage;Compression bandaging;Scar mobilization;Manual techniques;Taping   PT Next Visit Plan begin MLD to LUE and begin teaching patient technique, assist in getting compression glove and sleeve   Consulted and Agree with Plan of Care Patient      Patient will benefit from skilled therapeutic intervention in order to improve the following deficits and impairments:  Pain, Decreased strength, Increased edema, Decreased knowledge of precautions  Visit Diagnosis: Lymphedema, not elsewhere classified - Plan: PT plan of care cert/re-cert  Muscle weakness (generalized) - Plan: PT plan of care cert/re-cert      G-Codes - 82/99/37 1041    Functional Assessment Tool Used  (Outpatient Only) LLIS   Functional Limitation Other PT primary   Other PT Primary Current Status (J6967) At least 1 percent but less than 20 percent impaired, limited or restricted   Other PT Primary Goal Status (E9381) 0 percent impaired, limited or restricted       Problem List Patient Active Problem List   Diagnosis Date Noted  . Abdominal pain, epigastric 04/11/2016  . Nausea 04/11/2016  . 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  . Belching 01/03/2016  . Bloating 05/23/2015  . Diarrhea 05/23/2015  . Heartburn 05/23/2015  . Bilateral occipital neuralgia 11/23/2014  . Cervicogenic headache 11/23/2014  . Neck pain 11/23/2014  . OSA on CPAP 11/23/2014  . Generalized anxiety disorder 02/04/2014  . Labile hypertension   . Fibromyalgia   . Cervical spondylolysis 12/24/2013  . CAD (coronary artery disease) 12/24/2013  . Hypertensive emergency 12/23/2013  . Acute encephalopathy 12/23/2013  . Bronchitis, chronic obstructive w acute bronchitis ([place]) 12/21/2013  . Diverticulosis of colon without hemorrhage 11/15/2013  . Hot flashes related to aromatase inhibitor therapy 07/27/2013  . Osteopenia 07/27/2013  . DJD (degenerative joint disease) 09/29/2012  . Breast cancer, Left 12/20/2010  . Leg pain 11/28/2010  . FATTY LIVER DISEASE 01/09/2010  . GASTRITIS 12/16/2007  . Allergic rhinitis 02/18/2007  . DIZZINESS, CHRONIC 02/18/2007  . Headache in back of head 02/18/2007  . Mixed hyperlipidemia 12/23/2006  . GLAUCOMA 12/23/2006  . Coronary atherosclerosis 12/23/2006  . Asthma 12/23/2006  . Gastroesophageal reflux disease 12/23/2006  . IRRITABLE BOWEL SYNDROME 12/23/2006  . Myalgia and myositis 12/23/2006    Allyson Sabal [facility] 07/31/2016, 10:45 AM  [place], [place], [address] Phone: [phone]   Fax:  [phone]  Name: [name] MRN: [id number] Date of Birth: May 28, 1935  Manus Gunning, PT 07/31/16 10:45 AM

## 2016-08-01 ENCOUNTER — Ambulatory Visit: Payer: Medicare Other | Admitting: Physical Therapy

## 2016-08-01 ENCOUNTER — Encounter: Payer: Self-pay | Admitting: Physical Therapy

## 2016-08-01 DIAGNOSIS — I890 Lymphedema, not elsewhere classified: Secondary | ICD-10-CM

## 2016-08-01 DIAGNOSIS — M6281 Muscle weakness (generalized): Secondary | ICD-10-CM | POA: Diagnosis not present

## 2016-08-01 NOTE — Therapy (Addendum)
[place], [place], [address] Phone: [phone]   Fax:  [phone]  Physical Therapy Treatment  Patient Details  Name: [name] MRN: [id number] Date of Birth: [birth year] Referring Provider: Magrinat  Encounter Date: 08/01/2016      PT End of Session - 08/01/16 1224    Visit Number 2   Number of Visits 7   Date for PT Re-Evaluation 09/11/16   PT Start Time 0935   PT Stop Time 1017   PT Time Calculation (min) 42 min   Activity Tolerance Patient tolerated treatment well   Behavior During Therapy [facility] for tasks assessed/performed      Past Medical History:  Diagnosis Date  . Asthma   . Atrophic vaginitis   . Breast cancer, Left 12/20/2010   NO BLOOD PRESSURE CHECKS OR STICKS IN LEFT ARM  . CAD (coronary artery disease)    a. 01/19/2010 s/p CABG x 3, [place]->lad, vg->diag, vg->om1;  b. 06/2011 :Lexi MV: EF 84%, No ischemia. c. 01/06/14 s/p negative nuclear stress test with EF >70%  . Cervical arthritis ([place])   . Colonic polyp 04-27-2009   tubular adenoma  . Diverticulosis of colon (without mention of hemorrhage)   . Fibromyalgia   . Gallstones   . GERD (gastroesophageal reflux disease)   . Glaucoma   . Headache(784.0)    a. frequently assocaited with high BPs  . Hiatal hernia   . Hypercholesterolemia   . Irritable bowel syndrome   . Labile hypertension     Past Surgical History:  Procedure Laterality Date  . [place] STUDY N/A 01/08/2016   Procedure: [place] STUDY;  Surgeon: Ladene Artist, MD;  [facility];  Service: Endoscopy;  Laterality: N/A;  . BREAST LUMPECTOMY Left 02/06/11  . CATARACT EXTRACTION, BILATERAL     bilateral caaract removal,  . CORONARY ANGIOPLASTY WITH STENT PLACEMENT     Stent 2007  . CORONARY ARTERY BYPASS GRAFT    . ESOPHAGEAL MANOMETRY N/A 01/08/2016   Procedure: ESOPHAGEAL MANOMETRY (EM);  Surgeon: Ladene Artist, MD;  [facility];  Service: Endoscopy;  Laterality: N/A;  . open heart surgery     01/19/2010    There were no vitals filed for this visit.      Subjective Assessment - 08/01/16 0938    Subjective My hand has gone down a little bit from yesterday.    Pertinent History 2012 L breast cancer status post left lumpectomy and sentinel lymph node biopsy December of 2012 for a T2 N0, stage IIA invasive ductal carcinoma, grade 3, HER-2 not amplified, strongly estrogen and progesterone receptor positive with an elevated proliferation marker at 72%; , Feb 06, 2011- lumpectomy, completed radiation, open heart surgery in 2011 - 3 bypasses, fibromyalgia   Patient Stated Goals to get swelling down and know how to manage it   Currently in Pain? No/denies               LYMPHEDEMA/ONCOLOGY QUESTIONNAIRE - 08/01/16 0939      Left Upper Extremity Lymphedema   Across Hand at Thumb Web Space 18.5 cm                  [facility] Adult PT Treatment/Exercise - 08/01/16 0001      Manual Therapy   Manual Therapy Edema management;Manual Lymphatic Drainage (MLD)   Edema Management educated patient about different types of compression sleeves: flat knit vs circular knit and pros/cons of  each. Showed pt different samples and talked with her about where to purchase   Manual Lymphatic Drainage (MLD) short neck, 5 diaphragmatic breaths, right axillary nodes and establishment of interaxillary pathway, left inguinal nodes and establishment of axillo inguinal pathway, L UE working proximal to distal then retracing all steps while educating patient in proper technique and sequence                PT Education - 08/19/2016 1029    Education provided Yes   Education Details anatomy and physiology of lymphatic system, lymphedema risk reduction practices   Person(s) Educated Patient   Methods Explanation   Comprehension Verbalized understanding           Short Term Clinic Goals - 08-19-16 1039      CC  Short Term Goal  #1   Title Patient will be able to independently verbalize lymphedema risk reduction practices.   Time 3   Period Weeks   Status New             Long Term Clinic Goals - 08/19/16 1039      CC Long Term Goal  #1   Title Patient will obtain appropriate compression garments including sleeve and glove for long term management of edema   Time 6   Period Weeks   Status New     CC Long Term Goal  #2   Title Pt will be able to perform self drainage to L UE and breast for long term management of edema   Time 6   Period Weeks   Status New     CC Long Term Goal  #3   Title Pt will be independent in a strengthening program for bilateral shoulders to return to prior level of function   Time 6   Period Weeks   Status New     CC Long Term Goal  #4   Title Pt will report a at least a 65% improvement in swelling in left upper extremity to allow increased comfort.    Time 6   Period Weeks   Status New            Plan - 08/01/16 1225    Clinical Impression Statement Educated patient on different types of compression garments including flat knit and circular knit. Patient ultimately decided on the circular knit sleeve and glove. Will send off for RX for these. Began instruction and completed MLD to LUE. WIll issue handout at next session.    Rehab Potential Good   Clinical Impairments Affecting Rehab Potential hx of radiation   PT Frequency 1x / week   PT Duration 6 weeks   PT Treatment/Interventions ADLs/Self Care Home Management;Therapeutic exercise;Patient/family education;Manual lymph drainage;Compression bandaging;Scar mobilization;Manual techniques;Taping   PT Next Visit Plan see if rx signed for compression sleeve and send pt to [employer] for [employer] sleeve and glove, continue MLD to LUE and issue handout   Consulted and Agree with Plan of Care Patient      Patient will benefit from skilled therapeutic intervention in order to improve the  following deficits and impairments:  Pain, Decreased strength, Increased edema, Decreased knowledge of precautions  Visit Diagnosis: Lymphedema, not elsewhere classified       G-Codes - 08-19-2016 1041    Functional Assessment Tool Used (Outpatient Only) LLIS   Functional Limitation Other PT primary   Other PT Primary Current Status (D6644) At least 1 percent but less than 20 percent impaired, limited or restricted  Other PT Primary Goal Status (S4739) 0 percent impaired, limited or restricted      Problem List Patient Active Problem List   Diagnosis Date Noted  . Abdominal pain, epigastric 04/11/2016  . Nausea 04/11/2016  . 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  . Belching 01/03/2016  . Bloating 05/23/2015  . Diarrhea 05/23/2015  . Heartburn 05/23/2015  . Bilateral occipital neuralgia 11/23/2014  . Cervicogenic headache 11/23/2014  . Neck pain 11/23/2014  . OSA on CPAP 11/23/2014  . Generalized anxiety disorder 02/04/2014  . Labile hypertension   . Fibromyalgia   . Cervical spondylolysis 12/24/2013  . CAD (coronary artery disease) 12/24/2013  . Hypertensive emergency 12/23/2013  . Acute encephalopathy 12/23/2013  . Bronchitis, chronic obstructive w acute bronchitis ([place]) 12/21/2013  . Diverticulosis of colon without hemorrhage 11/15/2013  . Hot flashes related to aromatase inhibitor therapy 07/27/2013  . Osteopenia 07/27/2013  . DJD (degenerative joint disease) 09/29/2012  . Breast cancer, Left 12/20/2010  . Leg pain 11/28/2010  . FATTY LIVER DISEASE 01/09/2010  . GASTRITIS 12/16/2007  . Allergic rhinitis 02/18/2007  . DIZZINESS, CHRONIC 02/18/2007  . Headache in back of head 02/18/2007  . Mixed hyperlipidemia 12/23/2006  . GLAUCOMA 12/23/2006  . Coronary atherosclerosis 12/23/2006  . Asthma 12/23/2006  . Gastroesophageal reflux disease 12/23/2006  . IRRITABLE BOWEL SYNDROME 12/23/2006  . Myalgia and myositis 12/23/2006    Allyson Sabal [facility] 08/01/2016,  12:27 PM  [place], [place], [address] Phone: [phone]   Fax:  [phone]  Name: NNENNA MEADOR MRN: [id number] Date of Birth: 1935-08-06  Manus Gunning, PT 08/01/16 12:28 PM

## 2016-08-05 ENCOUNTER — Ambulatory Visit: Payer: Medicare Other | Admitting: Physical Therapy

## 2016-08-05 DIAGNOSIS — I890 Lymphedema, not elsewhere classified: Secondary | ICD-10-CM

## 2016-08-05 DIAGNOSIS — M6281 Muscle weakness (generalized): Secondary | ICD-10-CM | POA: Diagnosis not present

## 2016-08-05 NOTE — Therapy (Addendum)
[place], [place], [address] Phone: [phone]   Fax:  [phone]  Physical Therapy Treatment  Patient Details  Name: [name] MRN: [id number] Date of Birth: [birth year] Referring Provider: Magrinat  Encounter Date: 08/05/2016      PT End of Session - 08/05/16 1542    Visit Number 3   Number of Visits 7   Date for PT Re-Evaluation 09/11/16   PT Start Time 9924   PT Stop Time 1540   PT Time Calculation (min) 58 min   Activity Tolerance Patient tolerated treatment well   Behavior During Therapy [name] for tasks assessed/performed      Past Medical History:  Diagnosis Date  . Asthma   . Atrophic vaginitis   . Breast cancer, Left 12/20/2010   NO BLOOD PRESSURE CHECKS OR STICKS IN LEFT ARM  . CAD (coronary artery disease)    a. 01/19/2010 s/p CABG x 3, lima->lad, vg->diag, vg->om1;  b. 06/2011 :Lexi MV: EF 84%, No ischemia. c. 01/06/14 s/p negative nuclear stress test with EF >70%  . Cervical arthritis ([place])   . Colonic polyp 04-27-2009   tubular adenoma  . Diverticulosis of colon (without mention of hemorrhage)   . Fibromyalgia   . Gallstones   . GERD (gastroesophageal reflux disease)   . Glaucoma   . Headache(784.0)    a. frequently assocaited with high BPs  . Hiatal hernia   . Hypercholesterolemia   . Irritable bowel syndrome   . Labile hypertension     Past Surgical History:  Procedure Laterality Date  . [place] STUDY N/A 01/08/2016   Procedure: [place] STUDY;  Surgeon: Ladene Artist, MD;  [facility]: [facility] ENDOS[facility];  Service: Endoscopy;  Laterality: N/A;  . BREAST LUMPECTOMY Left 02/06/11  . CATARACT EXTRACTION, BILATERAL     bilateral caaract removal,  . CORONARY ANGIOPLASTY WITH STENT PLACEMENT     Stent 2007  . CORONARY ARTERY BYPASS GRAFT    . ESOPHAGEAL MANOMETRY N/A 01/08/2016   Procedure: ESOPHAGEAL MANOMETRY (EM);  Surgeon: Ladene Artist, MD;  [facility]  ENDOS[facility];  Service: Endoscopy;  Laterality: N/A;  . open heart surgery     01/19/2010    There were no vitals filed for this visit.                       [facility] Adult PT Treatment/Exercise - 08/05/16 0001      Manual Therapy   Manual Therapy Edema management;Manual Lymphatic Drainage (MLD)   Manual Lymphatic Drainage (MLD) short neck, 5 diaphragmatic breaths, right axillary nodes and establishment of interaxillary pathway, left inguinal nodes and establishment of axillo inguinal pathway, L UE working proximal to distal then retracing all steps while educating patient in proper technique and sequence while having pt demonstrate and follow along with handout                PT Education - 08/05/16 1542    Education provided Yes   Education Details self drainage of left upper extremity   Person(s) Educated Patient   Methods Explanation;Handout   Comprehension Verbalized understanding           Short Term Clinic Goals - 07/31/16 1039      CC Short Term Goal  #1   Title Patient will be able to independently verbalize lymphedema risk reduction practices.   Time 3   Period Weeks   Status New  [facility] Goals - 07/31/16 1039      CC Long Term Goal  #1   Title Patient will obtain appropriate compression garments including sleeve and glove for long term management of edema   Time 6   Period Weeks   Status New     CC Long Term Goal  #2   Title Pt will be able to perform self drainage to L UE and breast for long term management of edema   Time 6   Period Weeks   Status New     CC Long Term Goal  #3   Title Pt will be independent in a strengthening program for bilateral shoulders to return to prior level of function   Time 6   Period Weeks   Status New     CC Long Term Goal  #4   Title Pt will report a at least a 65% improvement in swelling in left upper extremity to allow increased comfort.    Time 6   Period Weeks    Status New            Plan - 08/05/16 1543    Clinical Impression Statement Patient will now be measured for a flat knit compression garment for better management of hand edema on Wednesday. Continued instructing pt in self drainage technique for left upper extremity. Issued handout to patient and had patient demonstrate correct technique and hand placement.    Rehab Potential Good   Clinical Impairments Affecting Rehab Potential hx of radiation   PT Frequency 1x / week   PT Duration 6 weeks   PT Treatment/Interventions ADLs/Self Care Home Management;Therapeutic exercise;Patient/family education;Manual lymph drainage;Compression bandaging;Scar mobilization;Manual techniques;Taping   PT Next Visit Plan pt will be measured for flat knit compression sleeve and glove on wednesday, assess independence with MLD to LUE - add breast since pt feels it may be swollen   Consulted and Agree with Plan of Care Patient      Patient will benefit from skilled therapeutic intervention in order to improve the following deficits and impairments:  Pain, Decreased strength, Increased edema, Decreased knowledge of precautions  Visit Diagnosis: Lymphedema, not elsewhere classified     Problem List Patient Active Problem List   Diagnosis Date Noted  . Abdominal pain, epigastric 04/11/2016  . Nausea 04/11/2016  . 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  . Belching 01/03/2016  . Bloating 05/23/2015  . Diarrhea 05/23/2015  . Heartburn 05/23/2015  . Bilateral occipital neuralgia 11/23/2014  . Cervicogenic headache 11/23/2014  . Neck pain 11/23/2014  . OSA on CPAP 11/23/2014  . Generalized anxiety disorder 02/04/2014  . Labile hypertension   . Fibromyalgia   . Cervical spondylolysis 12/24/2013  . CAD (coronary artery disease) 12/24/2013  . Hypertensive emergency 12/23/2013  . Acute encephalopathy 12/23/2013  . Bronchitis, chronic obstructive w acute bronchitis ([place]) 12/21/2013  . Diverticulosis of  colon without hemorrhage 11/15/2013  . Hot flashes related to aromatase inhibitor therapy 07/27/2013  . Osteopenia 07/27/2013  . DJD (degenerative joint disease) 09/29/2012  . Breast cancer, Left 12/20/2010  . Leg pain 11/28/2010  . FATTY LIVER DISEASE 01/09/2010  . GASTRITIS 12/16/2007  . Allergic rhinitis 02/18/2007  . DIZZINESS, CHRONIC 02/18/2007  . Headache in back of head 02/18/2007  . Mixed hyperlipidemia 12/23/2006  . GLAUCOMA 12/23/2006  . Coronary atherosclerosis 12/23/2006  . Asthma 12/23/2006  . Gastroesophageal reflux disease 12/23/2006  . IRRITABLE BOWEL SYNDROME 12/23/2006  . Myalgia and myositis 12/23/2006    Allyson Sabal [facility] 08/05/2016,  3:46 PM  [place], [place], [address] Phone: [phone]   Fax:  [phone]  Name: [name] MRN: [id number] Date of Birth: 15-Dec-1935  Manus Gunning, PT 08/05/16 3:47 PM

## 2016-08-05 NOTE — Patient Instructions (Signed)

## 2016-08-07 DIAGNOSIS — J3089 Other allergic rhinitis: Secondary | ICD-10-CM | POA: Diagnosis not present

## 2016-08-07 DIAGNOSIS — J301 Allergic rhinitis due to pollen: Secondary | ICD-10-CM | POA: Diagnosis not present

## 2016-08-12 ENCOUNTER — Ambulatory Visit: Payer: Medicare Other | Admitting: Physical Therapy

## 2016-08-12 ENCOUNTER — Encounter: Payer: Self-pay | Admitting: Physical Therapy

## 2016-08-12 DIAGNOSIS — I890 Lymphedema, not elsewhere classified: Secondary | ICD-10-CM

## 2016-08-12 DIAGNOSIS — M6281 Muscle weakness (generalized): Secondary | ICD-10-CM | POA: Diagnosis not present

## 2016-08-12 NOTE — Therapy (Addendum)
[place], [place], [address] Phone: [phone]   Fax:  [phone]  Physical Therapy Treatment  Patient Details  Name: [name] MRN: [id number] Date of Birth: Apr 02, 1935 Referring Provider: Magrinat  Encounter Date: 08/12/2016      PT End of Session - 08/26/16 1016    Visit Number 6   Number of Visits 7   Date for PT Re-Evaluation 09/11/16   PT Start Time 0847   PT Stop Time 0935   PT Time Calculation (min) 48 min   Activity Tolerance Patient tolerated treatment well   Behavior During Therapy [facility] for tasks assessed/performed      Past Medical History:  Diagnosis Date  . Asthma   . Atrophic vaginitis   . Breast cancer, Left 12/20/2010   NO BLOOD PRESSURE CHECKS OR STICKS IN LEFT ARM  . CAD (coronary artery disease)    a. 01/19/2010 s/p CABG x 3, lima->lad, vg->diag, vg->om1;  b. 06/2011 :Lexi MV: EF 84%, No ischemia. c. 01/06/14 s/p negative nuclear stress test with EF >70%  . Cervical arthritis ([place])   . Colonic polyp 04-27-2009   tubular adenoma  . Diverticulosis of colon (without mention of hemorrhage)   . Fibromyalgia   . Gallstones   . GERD (gastroesophageal reflux disease)   . Glaucoma   . Headache(784.0)    a. frequently assocaited with high BPs  . Hiatal hernia   . Hypercholesterolemia   . Irritable bowel syndrome   . Labile hypertension     Past Surgical History:  Procedure Laterality Date  . [place] STUDY N/A 01/08/2016   Procedure: [place] STUDY;  Surgeon: Ladene Artist, MD;  [facility];  Service: Endoscopy;  Laterality: N/A;  . BREAST LUMPECTOMY Left 02/06/11  . CATARACT EXTRACTION, BILATERAL     bilateral caaract removal,  . CORONARY ANGIOPLASTY WITH STENT PLACEMENT     Stent 2007  . CORONARY ARTERY BYPASS GRAFT    . ESOPHAGEAL MANOMETRY N/A 01/08/2016   Procedure: ESOPHAGEAL MANOMETRY (EM);  Surgeon: Ladene Artist, MD;  [facility];  Service: Endoscopy;  Laterality: N/A;  . open heart surgery     01/19/2010    There were no vitals filed for this visit.      Subjective Assessment - 08/26/16 0849    Subjective My hand is doing about the same.    Pertinent History 2012 L breast cancer status post left lumpectomy and sentinel lymph node biopsy December of 2012 for a T2 N0, stage IIA invasive ductal carcinoma, grade 3, HER-2 not amplified, strongly estrogen and progesterone receptor positive with an elevated proliferation marker at 72%; , Feb 06, 2011- lumpectomy, completed radiation, open heart surgery in 2011 - 3 bypasses, fibromyalgia   Patient Stated Goals to get swelling down and know how to manage it   Currently in Pain? No/denies               LYMPHEDEMA/ONCOLOGY QUESTIONNAIRE - 08/26/16 0853      Left Upper Extremity Lymphedema   15 cm Proximal to Olecranon Process 28.4 cm   10 cm Proximal to Olecranon Process 26.8 cm   Olecranon Process 24.7 cm   15 cm Proximal to Ulnar Styloid Process 24.3 cm   10 cm Proximal to Ulnar Styloid Process 21.8 cm   Just Proximal to Ulnar Styloid Process 16.5 cm   Across Hand at [employer] 18.6 cm   At [place] of  2nd Digit 6.5 cm                             Short Term Clinic Goals - 08/26/16 0865      CC Short Term Goal  #1   Title Patient will be able to independently verbalize lymphedema risk reduction practices.   Baseline 08/12/16- pt required some cueing in order to verbalize precautions, 08/26/16- pt able to recall these independently   Time [facility] Goals - 08/26/16 0850      CC Long Term Goal  #1   Title Patient will obtain appropriate compression garments including sleeve and glove for long term management of edema   Baseline 08/26/16- pt awaiting arrival of compression garments   Time 6   Period Weeks   Status On-going     CC Long Term Goal  #2   Title Pt  will be able to perform self drainage to L UE and breast for long term management of edema   Baseline 08/26/16- pt still requiring cues with breast lymphedema   Time 6   Period Weeks   Status On-going     CC Long Term Goal  #3   Title Pt will be independent in a strengthening program for bilateral shoulders to return to prior level of function   Time 6   Period Weeks   Status On-going     CC Long Term Goal  #4   Title Pt will report a at least a 65% improvement in swelling in left upper extremity to allow increased comfort.    Baseline 08/26/16- 50% improvement   Time 6   Period Weeks   Status On-going            Plan - 08/26/16 1016    Clinical Impression Statement Assessed pt's independence with self drainage of her LUE. Pt demonstrates independence with this and did require cueing for sequence or hand placement. She did have questions for performing breast drainage correctly. Re educated pt in this. Will assess for independence next session and once independent with begin left shoulder strengthening exercises.    Rehab Potential Good   Clinical Impairments Affecting Rehab Potential hx of radiation   PT Frequency 1x / week   PT Duration 6 weeks   PT Treatment/Interventions ADLs/Self Care Home Management;Therapeutic exercise;Patient/family education;Manual lymph drainage;Compression bandaging;Scar mobilization;Manual techniques;Taping   PT Next Visit Plan  assess independence with MLD to L breast, add strengthening exercises as time allows   Consulted and Agree with Plan of Care Patient      Patient will benefit from skilled therapeutic intervention in order to improve the following deficits and impairments:  Pain, Decreased strength, Increased edema, Decreased knowledge of precautions  Visit Diagnosis: Lymphedema, not elsewhere classified     Problem List Patient Active Problem List   Diagnosis Date Noted  . Abdominal pain, epigastric 04/11/2016  . Nausea 04/11/2016  .  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  . Belching 01/03/2016  . Bloating 05/23/2015  . Diarrhea 05/23/2015  . Heartburn 05/23/2015  . Bilateral occipital neuralgia 11/23/2014  . Cervicogenic headache 11/23/2014  . Neck pain 11/23/2014  . OSA on CPAP 11/23/2014  . Generalized anxiety disorder 02/04/2014  . Labile hypertension   . Fibromyalgia   . Cervical spondylolysis 12/24/2013  . CAD (coronary artery disease) 12/24/2013  .  Hypertensive emergency 12/23/2013  . Acute encephalopathy 12/23/2013  . Bronchitis, chronic obstructive w acute bronchitis ([place]) 12/21/2013  . Diverticulosis of colon without hemorrhage 11/15/2013  . Hot flashes related to aromatase inhibitor therapy 07/27/2013  . Osteopenia 07/27/2013  . DJD (degenerative joint disease) 09/29/2012  . Breast cancer, Left 12/20/2010  . Leg pain 11/28/2010  . FATTY LIVER DISEASE 01/09/2010  . GASTRITIS 12/16/2007  . Allergic rhinitis 02/18/2007  . DIZZINESS, CHRONIC 02/18/2007  . Headache in back of head 02/18/2007  . Mixed hyperlipidemia 12/23/2006  . GLAUCOMA 12/23/2006  . Coronary atherosclerosis 12/23/2006  . Asthma 12/23/2006  . Gastroesophageal reflux disease 12/23/2006  . IRRITABLE BOWEL SYNDROME 12/23/2006  . Myalgia and myositis 12/23/2006    Allyson Sabal [facility] 08/27/2016, 8:08 AM  [place], [place], [address] Phone: [phone]   Fax:  [phone]  Name: HEDY GARRO MRN: [id number] Date of Birth: [birth year]  Manus Gunning, PT 08/12/16 8:08 AM

## 2016-08-13 MED FILL — AMLODIPINE BESYLATE 5 MG TA: 5 | 90 days supply | Qty: 90 | Fill #1

## 2016-08-20 ENCOUNTER — Encounter: Payer: Self-pay | Admitting: Physical Therapy

## 2016-08-20 ENCOUNTER — Ambulatory Visit: Payer: Medicare Other | Admitting: Physical Therapy

## 2016-08-20 DIAGNOSIS — I890 Lymphedema, not elsewhere classified: Secondary | ICD-10-CM

## 2016-08-20 DIAGNOSIS — M6281 Muscle weakness (generalized): Secondary | ICD-10-CM | POA: Diagnosis not present

## 2016-08-20 NOTE — Patient Instructions (Signed)
Self manual lymph drainage: °Perform this sequence once a day.  Only give enough pressure no your skin to make the skin move. ° °Diaphragmatic - Supine ° ° °Inhale through nose making navel move out toward hands. Exhale through puckered lips, hands follow navel in. °Repeat _5__ times. Rest _10__ seconds between repeats.  ° °Copyright © VHI. All rights reserved.  °Hug yourself.  Do circles at your neck just above your collarbones.  Repeat this 10 times. ° °Axilla - One at a Time ° ° °Using full weight of flat hand and fingers at center of uninvolved armpit, make _10__ in-place circles.  ° °Copyright © VHI. All rights reserved.  °LEG: Inguinal Nodes Stimulation ° ° °With small finger side of hand against hip crease on involved side, gently perform circles at the crease. °Repeat __10_ times.  ° °Copyright © VHI. All rights reserved.  °1) Axilla to Inguinal Nodes - Sweep ° ° °On involved side, sweep _4__ times from armpit along side of trunk to hip crease. ° °Now gently stretch skin from the involved side to the uninvolved side across the chest at the shoulder line.  Repeat that 4 times. ° °Draw an imaginary diagonal line from upper outer breast through the nipple area toward lower inner breast.  Direct fluid upward and inward from this line toward the pathway across your upper chest .  Do this in three rows to treat all of the upper inner breast tissue, and do each row 3-4x. °     Direct fluid to treat all of lower outer breast tissue downward and outward toward      pathway that is aimed at the left groin. ° ° ° ° ° ° ° ° ° ° ° ° ° ° ° °Finish by doing the pathways as described above going from your involved armpit to the same side groin and going across your upper chest from the involved shoulder to the uninvolved shoulder. ° °Repeat the steps above where you do circles in your left groin and right armpit. °Copyright © VHI. All rights reserved.  ° °

## 2016-08-20 NOTE — Therapy (Addendum)
[place], [place], [address] Phone: [phone]   Fax:  [phone]  Physical Therapy Treatment  Patient Details  Name: [name] MRN: [id number] Date of Birth: 03-24-1935 Referring Provider: Magrinat  Encounter Date: 08/20/2016      PT End of Session - 08/26/16 1016    Visit Number 6   Number of Visits 7   Date for PT Re-Evaluation 09/11/16   PT Start Time 0847   PT Stop Time 0935   PT Time Calculation (min) 48 min   Activity Tolerance Patient tolerated treatment well   Behavior During Therapy [facility] for tasks assessed/performed      Past Medical History:  Diagnosis Date  . Asthma   . Atrophic vaginitis   . Breast cancer, Left 12/20/2010   NO BLOOD PRESSURE CHECKS OR STICKS IN LEFT ARM  . CAD (coronary artery disease)    a. 01/19/2010 s/p CABG x 3, lima->lad, vg->diag, vg->om1;  b. 06/2011 :Lexi MV: EF 84%, No ischemia. c. 01/06/14 s/p negative nuclear stress test with EF >70%  . Cervical arthritis ([place])   . Colonic polyp 04-27-2009   tubular adenoma  . Diverticulosis of colon (without mention of hemorrhage)   . Fibromyalgia   . Gallstones   . GERD (gastroesophageal reflux disease)   . Glaucoma   . Headache(784.0)    a. frequently assocaited with high BPs  . Hiatal hernia   . Hypercholesterolemia   . Irritable bowel syndrome   . Labile hypertension     Past Surgical History:  Procedure Laterality Date  . [place] STUDY N/A 01/08/2016   Procedure: [place] STUDY;  Surgeon: Ladene Artist, MD;  [facility];  Service: Endoscopy;  Laterality: N/A;  . BREAST LUMPECTOMY Left 02/06/11  . CATARACT EXTRACTION, BILATERAL     bilateral caaract removal,  . CORONARY ANGIOPLASTY WITH STENT PLACEMENT     Stent 2007  . CORONARY ARTERY BYPASS GRAFT    . ESOPHAGEAL MANOMETRY N/A 01/08/2016   Procedure: ESOPHAGEAL MANOMETRY (EM);  Surgeon: Ladene Artist, MD;  [facility];  Service: Endoscopy;  Laterality: N/A;  . open heart surgery     01/19/2010    There were no vitals filed for this visit.      Subjective Assessment - 08/26/16 0849    Subjective My hand is doing about the same.    Pertinent History 2012 L breast cancer status post left lumpectomy and sentinel lymph node biopsy December of 2012 for a T2 N0, stage IIA invasive ductal carcinoma, grade 3, HER-2 not amplified, strongly estrogen and progesterone receptor positive with an elevated proliferation marker at 72%; , Feb 06, 2011- lumpectomy, completed radiation, open heart surgery in 2011 - 3 bypasses, fibromyalgia   Patient Stated Goals to get swelling down and know how to manage it   Currently in Pain? No/denies               LYMPHEDEMA/ONCOLOGY QUESTIONNAIRE - 08/26/16 0853      Left Upper Extremity Lymphedema   15 cm Proximal to Olecranon Process 28.4 cm   10 cm Proximal to Olecranon Process 26.8 cm   Olecranon Process 24.7 cm   15 cm Proximal to Ulnar Styloid Process 24.3 cm   10 cm Proximal to Ulnar Styloid Process 21.8 cm   Just Proximal to Ulnar Styloid Process 16.5 cm   Across Hand at [employer] 18.6 cm   At [place] of  2nd Digit 6.5 cm                             Short Term Clinic Goals - 08/26/16 0865      CC Short Term Goal  #1   Title Patient will be able to independently verbalize lymphedema risk reduction practices.   Baseline 08/12/16- pt required some cueing in order to verbalize precautions, 08/26/16- pt able to recall these independently   Time [facility] Goals - 08/26/16 0850      CC Long Term Goal  #1   Title Patient will obtain appropriate compression garments including sleeve and glove for long term management of edema   Baseline 08/26/16- pt awaiting arrival of compression garments   Time 6   Period Weeks   Status On-going     CC Long Term Goal  #2   Title Pt  will be able to perform self drainage to L UE and breast for long term management of edema   Baseline 08/26/16- pt still requiring cues with breast lymphedema   Time 6   Period Weeks   Status On-going     CC Long Term Goal  #3   Title Pt will be independent in a strengthening program for bilateral shoulders to return to prior level of function   Time 6   Period Weeks   Status On-going     CC Long Term Goal  #4   Title Pt will report a at least a 65% improvement in swelling in left upper extremity to allow increased comfort.    Baseline 08/26/16- 50% improvement   Time 6   Period Weeks   Status On-going            Plan - 08/26/16 1016    Clinical Impression Statement Assessed pt's independence with self drainage of her LUE. Pt demonstrates independence with this and did require cueing for sequence or hand placement. She did have questions for performing breast drainage correctly. Re educated pt in this. Will assess for independence next session and once independent with begin left shoulder strengthening exercises.    Rehab Potential Good   Clinical Impairments Affecting Rehab Potential hx of radiation   PT Frequency 1x / week   PT Duration 6 weeks   PT Treatment/Interventions ADLs/Self Care Home Management;Therapeutic exercise;Patient/family education;Manual lymph drainage;Compression bandaging;Scar mobilization;Manual techniques;Taping   PT Next Visit Plan  assess independence with MLD to L breast, add strengthening exercises as time allows   Consulted and Agree with Plan of Care Patient      Patient will benefit from skilled therapeutic intervention in order to improve the following deficits and impairments:  Pain, Decreased strength, Increased edema, Decreased knowledge of precautions  Visit Diagnosis: Lymphedema, not elsewhere classified     Problem List Patient Active Problem List   Diagnosis Date Noted  . Abdominal pain, epigastric 04/11/2016  . Nausea 04/11/2016  .  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  . Belching 01/03/2016  . Bloating 05/23/2015  . Diarrhea 05/23/2015  . Heartburn 05/23/2015  . Bilateral occipital neuralgia 11/23/2014  . Cervicogenic headache 11/23/2014  . Neck pain 11/23/2014  . OSA on CPAP 11/23/2014  . Generalized anxiety disorder 02/04/2014  . Labile hypertension   . Fibromyalgia   . Cervical spondylolysis 12/24/2013  . CAD (coronary artery disease) 12/24/2013  .  Hypertensive emergency 12/23/2013  . Acute encephalopathy 12/23/2013  . Bronchitis, chronic obstructive w acute bronchitis ([place]) 12/21/2013  . Diverticulosis of colon without hemorrhage 11/15/2013  . Hot flashes related to aromatase inhibitor therapy 07/27/2013  . Osteopenia 07/27/2013  . DJD (degenerative joint disease) 09/29/2012  . Breast cancer, Left 12/20/2010  . Leg pain 11/28/2010  . FATTY LIVER DISEASE 01/09/2010  . GASTRITIS 12/16/2007  . Allergic rhinitis 02/18/2007  . DIZZINESS, CHRONIC 02/18/2007  . Headache in back of head 02/18/2007  . Mixed hyperlipidemia 12/23/2006  . GLAUCOMA 12/23/2006  . Coronary atherosclerosis 12/23/2006  . Asthma 12/23/2006  . Gastroesophageal reflux disease 12/23/2006  . IRRITABLE BOWEL SYNDROME 12/23/2006  . Myalgia and myositis 12/23/2006    Allyson Sabal [facility] 08/27/2016, 8:07 AM  [place], [place], [address] Phone: [phone]   Fax:  [phone]  Name: [name] MRN: [id number] Date of Birth: Aug 09, 1935  Manus Gunning, PT 08/27/16 8:07 AM

## 2016-08-26 ENCOUNTER — Ambulatory Visit: Payer: Medicare Other | Attending: Oncology | Admitting: Physical Therapy

## 2016-08-26 DIAGNOSIS — I890 Lymphedema, not elsewhere classified: Secondary | ICD-10-CM

## 2016-08-26 DIAGNOSIS — J3089 Other allergic rhinitis: Secondary | ICD-10-CM | POA: Diagnosis not present

## 2016-08-26 DIAGNOSIS — M6281 Muscle weakness (generalized): Secondary | ICD-10-CM | POA: Diagnosis not present

## 2016-08-26 DIAGNOSIS — J301 Allergic rhinitis due to pollen: Secondary | ICD-10-CM | POA: Diagnosis not present

## 2016-08-26 NOTE — Therapy (Signed)
[facility] [address] [place], [place], [address] Phone: [phone]   Fax:  [phone]  Physical Therapy Treatment  Patient Details  Name: [name] MRN: [id number] Date of Birth: [birth year] Referring Provider: Magrinat  Encounter Date: 08/26/2016      PT End of Session - 08/26/16 1016    Visit Number 6   Number of Visits 7   Date for PT Re-Evaluation 09/11/16   PT Start Time 0847   PT Stop Time 0935   PT Time Calculation (min) 48 min   Activity Tolerance Patient tolerated treatment well   Behavior During Therapy [facility] for tasks assessed/performed      Past Medical History:  Diagnosis Date  . Asthma   . Atrophic vaginitis   . Breast cancer, Left 12/20/2010   NO BLOOD PRESSURE CHECKS OR STICKS IN LEFT ARM  . CAD (coronary artery disease)    a. 01/19/2010 s/p CABG x 3, lima->lad, vg->diag, vg->om1;  b. 06/2011 :Lexi MV: EF 84%, No ischemia. c. 01/06/14 s/p negative nuclear stress test with EF >70%  . Cervical arthritis (HCC)   . Colonic polyp 04-27-2009   tubular adenoma  . Diverticulosis of colon (without mention of hemorrhage)   . Fibromyalgia   . Gallstones   . GERD (gastroesophageal reflux disease)   . Glaucoma   . Headache(784.0)    a. frequently assocaited with high BPs  . Hiatal hernia   . Hypercholesterolemia   . Irritable bowel syndrome   . Labile hypertension     Past Surgical History:  Procedure Laterality Date  . 24 HOUR PH STUDY N/A 01/08/2016   Procedure: 24 HOUR PH STUDY;  Surgeon: Meryl Dare, MD;  [facility];  Service: Endoscopy;  Laterality: N/A;  . BREAST LUMPECTOMY Left 02/06/11  . CATARACT EXTRACTION, BILATERAL     bilateral caaract removal,  . CORONARY ANGIOPLASTY WITH STENT PLACEMENT     Stent 2007  . CORONARY ARTERY BYPASS GRAFT    . ESOPHAGEAL MANOMETRY N/A 01/08/2016   Procedure: ESOPHAGEAL MANOMETRY (EM);  Surgeon: Meryl Dare, MD;  [facility];  Service: Endoscopy;  Laterality: N/A;  . open heart surgery     01/19/2010    There were no vitals filed for this visit.      Subjective Assessment - 08/26/16 0849    Subjective My hand is doing about the same.    Pertinent History 2012 L breast cancer status post left lumpectomy and sentinel lymph node biopsy December of 2012 for a T2 N0, stage IIA invasive ductal carcinoma, grade 3, HER-2 not amplified, strongly estrogen and progesterone receptor positive with an elevated proliferation marker at 72%; , Feb 06, 2011- lumpectomy, completed radiation, open heart surgery in 2011 - 3 bypasses, fibromyalgia   Patient Stated Goals to get swelling down and know how to manage it   Currently in Pain? No/denies               LYMPHEDEMA/ONCOLOGY QUESTIONNAIRE - 08/26/16 0853      Left Upper Extremity Lymphedema   15 cm Proximal to Olecranon Process 28.4 cm   10 cm Proximal to Olecranon Process 26.8 cm   Olecranon Process 24.7 cm   15 cm Proximal to Ulnar Styloid Process 24.3 cm   10 cm Proximal to Ulnar Styloid Process 21.8 cm   Just Proximal to Ulnar Styloid Process 16.5 cm   Across Hand at [facility] 18.6 cm   At [place] of  2nd Digit 6.5 cm                  [facility] Adult PT Treatment/Exercise - 08/26/16 0001      Manual Therapy   Manual Therapy Edema management;Manual Lymphatic Drainage (MLD)   Manual Lymphatic Drainage (MLD) short neck, 5 diaphragmatic breaths, right axillary nodes and establishment of interaxillary pathway, left inguinal nodes and establishment of axillo inguinal pathway, L UE working proximal to distal then retracing all steps while educating patient in proper technique and sequence while having pt demonstrate and complete entire sequence, continued breast  sequence and had pt demonstrate correct technique for this- pt requiring only min cueing for breast drainage - pt indepedent with UE drainage                   Short Term  Clinic Goals - 08/26/16 0849      CC Short Term Goal  #1   Title Patient will be able to independently verbalize lymphedema risk reduction practices.   Baseline 08/12/16- pt required some cueing in order to verbalize precautions, 08/26/16- pt able to recall these independently   Time [place] [facility] Goals - 08/26/16 0850      CC Long Term Goal  #1   Title Patient will obtain appropriate compression garments including sleeve and glove for long term management of edema   Baseline 08/26/16- pt awaiting arrival of compression garments   Time 6   Period Weeks   Status On-going     CC Long Term Goal  #2   Title Pt will be able to perform self drainage to L UE and breast for long term management of edema   Baseline 08/26/16- pt still requiring cues with breast lymphedema   Time 6   Period Weeks   Status On-going     CC Long Term Goal  #3   Title Pt will be independent in a strengthening program for bilateral shoulders to return to prior level of function   Time 6   Period Weeks   Status On-going     CC Long Term Goal  #4   Title Pt will report a at least a 65% improvement in swelling in left upper extremity to allow increased comfort.    Baseline 08/26/16- 50% improvement   Time 6   Period Weeks   Status On-going            Plan - 08/26/16 1016    Clinical Impression Statement Assessed pt's independence with self drainage of her LUE. Pt demonstrates independence with this and did require cueing for sequence or hand placement. She did have questions for performing breast drainage correctly. Re educated pt in this. Will assess for independence next session and once independent with begin left shoulder strengthening exercises.    Rehab Potential Good   Clinical Impairments Affecting Rehab Potential hx of radiation   PT Frequency 1x / week   PT Duration 6 weeks   PT Treatment/Interventions ADLs/Self Care Home Management;Therapeutic  exercise;Patient/family education;Manual lymph drainage;Compression bandaging;Scar mobilization;Manual techniques;Taping   PT Next Visit Plan  assess independence with MLD to L breast, add strengthening exercises as time allows   Consulted and Agree with Plan of Care Patient      Patient will benefit from skilled therapeutic intervention in order to improve the following deficits and impairments:  Pain, Decreased strength,  Increased edema, Decreased knowledge of precautions  Visit Diagnosis: Lymphedema, not elsewhere classified     Problem List Patient Active Problem List   Diagnosis Date Noted  . Abdominal pain, epigastric 04/11/2016  . Nausea 04/11/2016  . 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  . Belching 01/03/2016  . Bloating 05/23/2015  . Diarrhea 05/23/2015  . Heartburn 05/23/2015  . Bilateral occipital neuralgia 11/23/2014  . Cervicogenic headache 11/23/2014  . Neck pain 11/23/2014  . OSA on CPAP 11/23/2014  . Generalized anxiety disorder 02/04/2014  . Labile hypertension   . Fibromyalgia   . Cervical spondylolysis 12/24/2013  . CAD (coronary artery disease) 12/24/2013  . Hypertensive emergency 12/23/2013  . Acute encephalopathy 12/23/2013  . Bronchitis, chronic obstructive w acute bronchitis ([place]) 12/21/2013  . Diverticulosis of colon without hemorrhage 11/15/2013  . Hot flashes related to aromatase inhibitor therapy 07/27/2013  . Osteopenia 07/27/2013  . DJD (degenerative joint disease) 09/29/2012  . Breast cancer, Left 12/20/2010  . Leg pain 11/28/2010  . FATTY LIVER DISEASE 01/09/2010  . GASTRITIS 12/16/2007  . Allergic rhinitis 02/18/2007  . DIZZINESS, CHRONIC 02/18/2007  . Headache in back of head 02/18/2007  . Mixed hyperlipidemia 12/23/2006  . GLAUCOMA 12/23/2006  . Coronary atherosclerosis 12/23/2006  . Asthma 12/23/2006  . Gastroesophageal reflux disease 12/23/2006  . IRRITABLE BOWEL SYNDROME 12/23/2006  . Myalgia and myositis 12/23/2006     Allyson Sabal [facility] 08/26/2016, 10:18 AM  [place], [place], [address] Phone: [phone]   Fax:  [phone]  Name: ALEXA BLISH MRN: [id number] Date of Birth: [birth year]  Manus Gunning, PT 08/26/16 10:19 AM

## 2016-09-05 ENCOUNTER — Encounter: Payer: Self-pay | Admitting: Physical Therapy

## 2016-09-05 ENCOUNTER — Ambulatory Visit: Payer: Medicare Other | Admitting: Physical Therapy

## 2016-09-05 DIAGNOSIS — I890 Lymphedema, not elsewhere classified: Secondary | ICD-10-CM | POA: Diagnosis not present

## 2016-09-05 DIAGNOSIS — M6281 Muscle weakness (generalized): Secondary | ICD-10-CM | POA: Diagnosis not present

## 2016-09-05 NOTE — Therapy (Signed)
[place], [place], [address] Phone: [phone]   Fax:  [phone]  Physical Therapy Treatment  Patient Details  Name: [name] MRN: [id number] Date of Birth: 1935/08/16 Referring Provider: Magrinat  Encounter Date: 09/05/2016      PT End of Session - 09/05/16 1444    Visit Number 7   Number of Visits 7   Date for PT Re-Evaluation 09/11/16   PT Start Time 1350   PT Stop Time 1431   PT Time Calculation (min) 41 min   Activity Tolerance Patient tolerated treatment well   Behavior During Therapy [facility] for tasks assessed/performed      Past Medical History:  Diagnosis Date  . Asthma   . Atrophic vaginitis   . Breast cancer, Left 12/20/2010   NO BLOOD PRESSURE CHECKS OR STICKS IN LEFT ARM  . CAD (coronary artery disease)    a. 01/19/2010 s/p CABG x 3, lima->lad, vg->diag, vg->om1;  b. 06/2011 :Lexi MV: EF 84%, No ischemia. c. 01/06/14 s/p negative nuclear stress test with EF >70%  . Cervical arthritis ([place])   . Colonic polyp 04-27-2009   tubular adenoma  . Diverticulosis of colon (without mention of hemorrhage)   . Fibromyalgia   . Gallstones   . GERD (gastroesophageal reflux disease)   . Glaucoma   . Headache(784.0)    a. frequently assocaited with high BPs  . Hiatal hernia   . Hypercholesterolemia   . Irritable bowel syndrome   . Labile hypertension     Past Surgical History:  Procedure Laterality Date  . [place] STUDY N/A 01/08/2016   Procedure: [place] STUDY;  Surgeon: Ladene Artist, MD;  [facility];  Service: Endoscopy;  Laterality: N/A;  . BREAST LUMPECTOMY Left 02/06/11  . CATARACT EXTRACTION, BILATERAL     bilateral caaract removal,  . CORONARY ANGIOPLASTY WITH STENT PLACEMENT     Stent 2007  . CORONARY ARTERY BYPASS GRAFT    . ESOPHAGEAL MANOMETRY N/A 01/08/2016   Procedure: ESOPHAGEAL MANOMETRY (EM);  Surgeon: Ladene Artist, MD;  [facility];  Service: Endoscopy;  Laterality: N/A;  . open heart surgery     01/19/2010    There were no vitals filed for this visit.      Subjective Assessment - 09/05/16 1353    Subjective My swelling is doing okay. I have only done the massage one time.    Pertinent History 2012 L breast cancer status post left lumpectomy and sentinel lymph node biopsy December of 2012 for a T2 N0, stage IIA invasive ductal carcinoma, grade 3, HER-2 not amplified, strongly estrogen and progesterone receptor positive with an elevated proliferation marker at 72%; , Feb 06, 2011- lumpectomy, completed radiation, open heart surgery in 2011 - 3 bypasses, fibromyalgia   Patient Stated Goals to get swelling down and know how to manage it   Currently in Pain? No/denies               LYMPHEDEMA/ONCOLOGY QUESTIONNAIRE - 09/05/16 1407      Left Upper Extremity Lymphedema   15 cm Proximal to Olecranon Process 27.8 cm   10 cm Proximal to Olecranon Process 26.5 cm   Olecranon Process 24.9 cm   15 cm Proximal to Ulnar Styloid Process 23.8 cm   10 cm Proximal to Ulnar Styloid Process 21.6 cm   Just Proximal to Ulnar Styloid Process 15.9 cm   Across Hand at [employer] 18.2  cm   At [facility]: [place] W of 2nd Digit 6.4 cm                  [facility] Adult PT Treatment/Exercise - 09/05/16 0001      Manual Therapy   Edema Management Pt's custom compression sleeve and glove arrived today. Assessed them for fit. Pt's custom compression sleeve is too long. New measurements were taken today and sent to DME provider for correction. Re measured circumferences today and pt demonstrates reduction. Pt's compression glove arrived and it fits well. Issued pt's glove to her and encouraged her to wear it when gardening with a dishwashing glove over it. Also cut a piece of TG soft for pt to wear on her arm for compression until she receives a new compression sleeve. Educated pt in proper donning/doffing of compression sleeve and  glove.                 PT Education - 09/05/16 1444    Education provided Yes   Education Details donning/doffing compression garments and compression wear schedule   Person(s) Educated Patient   Methods Explanation   Comprehension Verbalized understanding           Short Term Clinic Goals - 08/26/16 0849      CC Short Term Goal  #1   Title Patient will be able to independently verbalize lymphedema risk reduction practices.   Baseline 08/12/16- pt required some cueing in order to verbalize precautions, 08/26/16- pt able to recall these independently   Time 3   Period Weeks   Status Achieved             Long Term Clinic Goals - 08/26/16 0850      CC Long Term Goal  #1   Title Patient will obtain appropriate compression garments including sleeve and glove for long term management of edema   Baseline 08/26/16- pt awaiting arrival of compression garments   Time 6   Period Weeks   Status On-going     CC Long Term Goal  #2   Title Pt will be able to perform self drainage to L UE and breast for long term management of edema   Baseline 08/26/16- pt still requiring cues with breast lymphedema   Time 6   Period Weeks   Status On-going     CC Long Term Goal  #3   Title Pt will be independent in a strengthening program for bilateral shoulders to return to prior level of function   Time 6   Period Weeks   Status On-going     CC Long Term Goal  #4   Title Pt will report a at least a 65% improvement in swelling in left upper extremity to allow increased comfort.    Baseline 08/26/16- 50% improvement   Time 6   Period Weeks   Status On-going            Plan - 09/05/16 1446    Clinical Impression Statement Pt's compression garments arrived and fit was assessed. Pt's compression sleeve is too long leaving excess fabric at her antecubital fossa. New measurements were taken today and sent to DME supplier for correction. Pt's compression glove fit so encouraged pt to  wear compression glove with TG soft sleeve for compression while she waits on arrival of compression sleeve. Educated pt in proper way to don/doff compression garments. Circumferential measurements taken today show a decrease in edema.    Rehab Potential Good   Clinical Impairments Affecting  Rehab Potential hx of radiation   PT Frequency 1x / week   PT Duration 6 weeks   PT Treatment/Interventions ADLs/Self Care Home Management;Therapeutic exercise;Patient/family education;Manual lymph drainage;Compression bandaging;Scar mobilization;Manual techniques;Taping   PT Next Visit Plan  assess independence with MLD to L breast, give strength ABC program, assess sleeve if it has arrived   Consulted and Agree with Plan of Care Patient      Patient will benefit from skilled therapeutic intervention in order to improve the following deficits and impairments:  Pain, Decreased strength, Increased edema, Decreased knowledge of precautions  Visit Diagnosis: Lymphedema, not elsewhere classified     Problem List Patient Active Problem List   Diagnosis Date Noted  . Abdominal pain, epigastric 04/11/2016  . Nausea 04/11/2016  . 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  . Belching 01/03/2016  . Bloating 05/23/2015  . Diarrhea 05/23/2015  . Heartburn 05/23/2015  . Bilateral occipital neuralgia 11/23/2014  . Cervicogenic headache 11/23/2014  . Neck pain 11/23/2014  . OSA on CPAP 11/23/2014  . Generalized anxiety disorder 02/04/2014  . Labile hypertension   . Fibromyalgia   . Cervical spondylolysis 12/24/2013  . CAD (coronary artery disease) 12/24/2013  . Hypertensive emergency 12/23/2013  . Acute encephalopathy 12/23/2013  . Bronchitis, chronic obstructive w acute bronchitis ([place]) 12/21/2013  . Diverticulosis of colon without hemorrhage 11/15/2013  . Hot flashes related to aromatase inhibitor therapy 07/27/2013  . Osteopenia 07/27/2013  . DJD (degenerative joint disease) 09/29/2012  . Breast  cancer, Left 12/20/2010  . Leg pain 11/28/2010  . FATTY LIVER DISEASE 01/09/2010  . GASTRITIS 12/16/2007  . Allergic rhinitis 02/18/2007  . DIZZINESS, CHRONIC 02/18/2007  . Headache in back of head 02/18/2007  . Mixed hyperlipidemia 12/23/2006  . GLAUCOMA 12/23/2006  . Coronary atherosclerosis 12/23/2006  . Asthma 12/23/2006  . Gastroesophageal reflux disease 12/23/2006  . IRRITABLE BOWEL SYNDROME 12/23/2006  . Myalgia and myositis 12/23/2006    Allyson Sabal [facility] 09/05/2016, 2:54 PM  [place], [place], [address] Phone: [phone]   Fax:  [phone]  Name: [name] MRN: [id number] Date of Birth: 12-31-1935  Manus Gunning, PT 09/05/16 2:55 PM

## 2016-09-09 ENCOUNTER — Telehealth: Payer: Self-pay | Admitting: Obstetrics and Gynecology

## 2016-09-09 NOTE — Telephone Encounter (Signed)
Left patient a message to call back to reschedule a future appointment that was cancelled by the provider for [facility]EX. °

## 2016-09-10 ENCOUNTER — Encounter: Payer: Self-pay | Admitting: Physical Therapy

## 2016-09-10 ENCOUNTER — Ambulatory Visit: Payer: Medicare Other | Admitting: Physical Therapy

## 2016-09-10 DIAGNOSIS — M6281 Muscle weakness (generalized): Secondary | ICD-10-CM

## 2016-09-10 DIAGNOSIS — J301 Allergic rhinitis due to pollen: Secondary | ICD-10-CM | POA: Diagnosis not present

## 2016-09-10 DIAGNOSIS — I890 Lymphedema, not elsewhere classified: Secondary | ICD-10-CM

## 2016-09-10 DIAGNOSIS — J3089 Other allergic rhinitis: Secondary | ICD-10-CM | POA: Diagnosis not present

## 2016-09-10 NOTE — Therapy (Signed)
[place], [place], [address] Phone: [phone]   Fax:  [phone]  Physical Therapy Treatment  Patient Details  Name: [name] MRN: [id number] Date of Birth: 1935-11-05 Referring Provider: Magrinat  Encounter Date: 09/10/2016      PT End of Session - 09/10/16 1234    Visit Number 8   Number of Visits 7   Date for PT Re-Evaluation 09/11/16   PT Start Time 1021   PT Stop Time 1101   PT Time Calculation (min) 40 min   Activity Tolerance Patient tolerated treatment well   Behavior During Therapy [facility] for tasks assessed/performed      Past Medical History:  Diagnosis Date  . Asthma   . Atrophic vaginitis   . Breast cancer, Left 12/20/2010   NO BLOOD PRESSURE CHECKS OR STICKS IN LEFT ARM  . CAD (coronary artery disease)    a. 01/19/2010 s/p CABG x 3, lima->lad, vg->diag, vg->om1;  b. 06/2011 :Lexi MV: EF 84%, No ischemia. c. 01/06/14 s/p negative nuclear stress test with EF >70%  . Cervical arthritis ([place])   . Colonic polyp 04-27-2009   tubular adenoma  . Diverticulosis of colon (without mention of hemorrhage)   . Fibromyalgia   . Gallstones   . GERD (gastroesophageal reflux disease)   . Glaucoma   . Headache(784.0)    a. frequently assocaited with high BPs  . Hiatal hernia   . Hypercholesterolemia   . Irritable bowel syndrome   . Labile hypertension     Past Surgical History:  Procedure Laterality Date  . [place] STUDY N/A 01/08/2016   Procedure: [place] STUDY;  Surgeon: Ladene Artist, MD;  [facility];  Service: Endoscopy;  Laterality: N/A;  . BREAST LUMPECTOMY Left 02/06/11  . CATARACT EXTRACTION, BILATERAL     bilateral caaract removal,  . CORONARY ANGIOPLASTY WITH STENT PLACEMENT     Stent 2007  . CORONARY ARTERY BYPASS GRAFT    . ESOPHAGEAL MANOMETRY N/A 01/08/2016   Procedure: ESOPHAGEAL MANOMETRY (EM);  Surgeon: Ladene Artist, MD;  [facility];  Service: Endoscopy;  Laterality: N/A;  . open heart surgery     01/19/2010    There were no vitals filed for this visit.      Subjective Assessment - 09/10/16 1023    Subjective I haven't felt like my arm is swelling. I have been wearing the glove.    Pertinent History 2012 L breast cancer status post left lumpectomy and sentinel lymph node biopsy December of 2012 for a T2 N0, stage IIA invasive ductal carcinoma, grade 3, HER-2 not amplified, strongly estrogen and progesterone receptor positive with an elevated proliferation marker at 72%; , Feb 06, 2011- lumpectomy, completed radiation, open heart surgery in 2011 - 3 bypasses, fibromyalgia   Patient Stated Goals to get swelling down and know how to manage it   Currently in Pain? No/denies               LYMPHEDEMA/ONCOLOGY QUESTIONNAIRE - 09/10/16 1024      Left Upper Extremity Lymphedema   15 cm Proximal to Olecranon Process 27.6 cm   Olecranon Process 24.8 cm   15 cm Proximal to Ulnar Styloid Process 24.5 cm   Just Proximal to Ulnar Styloid Process 15.9 cm   Across Hand at [employer] 18.5 cm   At [place] of 2nd Digit 6.1 cm  [place] Adult PT Treatment/Exercise - 09/10/16 0001      Shoulder Exercises: Standing   Other Standing Exercises Instructed pt in Strength ABC program through rows- stopped here, pt held all stretches for 15 sec and performed all exercises 10 reps with 1 lb weights, pt requires more cueing to perform rows correctly with no increase in back pain or use of opposite upper trap                   Short Term Clinic Goals - 08/26/16 0849      CC Short Term Goal  #1   Title Patient will be able to independently verbalize lymphedema risk reduction practices.   Baseline 08/12/16- pt required some cueing in order to verbalize precautions, 08/26/16- pt able to recall these independently   Time [place] [facility] Goals - 08/26/16 0850      CC Long Term Goal  #1   Title Patient will obtain appropriate compression garments including sleeve and glove for long term management of edema   Baseline 08/26/16- pt awaiting arrival of compression garments   Time 6   Period Weeks   Status On-going     CC Long Term Goal  #2   Title Pt will be able to perform self drainage to L UE and breast for long term management of edema   Baseline 08/26/16- pt still requiring cues with breast lymphedema   Time 6   Period Weeks   Status On-going     CC Long Term Goal  #3   Title Pt will be independent in a strengthening program for bilateral shoulders to return to prior level of function   Time 6   Period Weeks   Status On-going     CC Long Term Goal  #4   Title Pt will report a at least a 65% improvement in swelling in left upper extremity to allow increased comfort.    Baseline 08/26/16- 50% improvement   Time 6   Period Weeks   Status On-going            Plan - 09/10/16 1235    Clinical Impression Statement Pt is still awaiting new compression sleeve since last one was too long. She has been wearing the compression glove without the sleeve. Began instructing pt in Strength ABC program to help progress pt towards her goal. Pt did well but required frequent verbal and tactile cues to perform correctly.    Rehab Potential Good   Clinical Impairments Affecting Rehab Potential hx of radiation   PT Frequency 1x / week   PT Duration 6 weeks   PT Treatment/Interventions ADLs/Self Care Home Management;Therapeutic exercise;Patient/family education;Manual lymph drainage;Compression bandaging;Scar mobilization;Manual techniques;Taping   PT Next Visit Plan  assess independence with MLD to L breast, continue instruction in strength ABC program- begin at rows, assess sleeve if it has arrived   Consulted and Agree with Plan of Care Patient      Patient will benefit from skilled therapeutic intervention in order to  improve the following deficits and impairments:  Pain, Decreased strength, Increased edema, Decreased knowledge of precautions  Visit Diagnosis: Muscle weakness (generalized)  Lymphedema, not elsewhere classified     Problem List Patient Active Problem List   Diagnosis Date Noted  . Abdominal pain, epigastric 04/11/2016  . Nausea 04/11/2016  . 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  . Belching  01/03/2016  . Bloating 05/23/2015  . Diarrhea 05/23/2015  . Heartburn 05/23/2015  . Bilateral occipital neuralgia 11/23/2014  . Cervicogenic headache 11/23/2014  . Neck pain 11/23/2014  . OSA on CPAP 11/23/2014  . Generalized anxiety disorder 02/04/2014  . Labile hypertension   . Fibromyalgia   . Cervical spondylolysis 12/24/2013  . CAD (coronary artery disease) 12/24/2013  . Hypertensive emergency 12/23/2013  . Acute encephalopathy 12/23/2013  . Bronchitis, chronic obstructive w acute bronchitis ([place]) 12/21/2013  . Diverticulosis of colon without hemorrhage 11/15/2013  . Hot flashes related to aromatase inhibitor therapy 07/27/2013  . Osteopenia 07/27/2013  . DJD (degenerative joint disease) 09/29/2012  . Breast cancer, Left 12/20/2010  . Leg pain 11/28/2010  . FATTY LIVER DISEASE 01/09/2010  . GASTRITIS 12/16/2007  . Allergic rhinitis 02/18/2007  . DIZZINESS, CHRONIC 02/18/2007  . Headache in back of head 02/18/2007  . Mixed hyperlipidemia 12/23/2006  . GLAUCOMA 12/23/2006  . Coronary atherosclerosis 12/23/2006  . Asthma 12/23/2006  . Gastroesophageal reflux disease 12/23/2006  . IRRITABLE BOWEL SYNDROME 12/23/2006  . Myalgia and myositis 12/23/2006    Allyson Sabal [facility] 09/10/2016, 12:37 PM  [place], [place], [address] Phone: [phone]   Fax:  [phone]  Name: [name] MRN: [id number] Date of Birth: September 05, 1935  Manus Gunning, PT 09/10/16 12:37 PM

## 2016-09-16 ENCOUNTER — Ambulatory Visit: Payer: Medicare Other | Admitting: Physical Therapy

## 2016-09-16 ENCOUNTER — Encounter: Payer: Self-pay | Admitting: Physical Therapy

## 2016-09-16 DIAGNOSIS — I890 Lymphedema, not elsewhere classified: Secondary | ICD-10-CM

## 2016-09-16 DIAGNOSIS — M6281 Muscle weakness (generalized): Secondary | ICD-10-CM

## 2016-09-16 NOTE — Therapy (Signed)
[facility] [address] [place], [place], [address] Phone: [phone]   Fax:  [phone]  Physical Therapy Treatment  Patient Details  Name: [name] MRN: [id number] Date of Birth: 1935/11/21 Referring Provider: Magrinat  Encounter Date: 09/16/2016      PT End of Session - 09/16/16 1656    Visit Number 9   Number of Visits 13   Date for PT Re-Evaluation 10/07/16   PT Start Time 1607   PT Stop Time 1651   PT Time Calculation (min) 44 min   Activity Tolerance Patient tolerated treatment well   Behavior During Therapy [facility] for tasks assessed/performed      Past Medical History:  Diagnosis Date  . Asthma   . Atrophic vaginitis   . Breast cancer, Left 12/20/2010   NO BLOOD PRESSURE CHECKS OR STICKS IN LEFT ARM  . CAD (coronary artery disease)    a. 01/19/2010 s/p CABG x 3, [place]->lad, vg->diag, vg->om1;  b. 06/2011 :Lexi MV: EF 84%, No ischemia. c. 01/06/14 s/p negative nuclear stress test with EF >70%  . Cervical arthritis (HCC)   . Colonic polyp 04-27-2009   tubular adenoma  . Diverticulosis of colon (without mention of hemorrhage)   . Fibromyalgia   . Gallstones   . GERD (gastroesophageal reflux disease)   . Glaucoma   . Headache(784.0)    a. frequently assocaited with high BPs  . Hiatal hernia   . Hypercholesterolemia   . Irritable bowel syndrome   . Labile hypertension     Past Surgical History:  Procedure Laterality Date  . 24 HOUR PH STUDY N/A 01/08/2016   Procedure: 24 HOUR PH STUDY;  Surgeon: Meryl Dare, MD;  Location: [facility];  Service: Endoscopy;  Laterality: N/A;  . BREAST LUMPECTOMY Left 02/06/11  . CATARACT EXTRACTION, BILATERAL     bilateral caaract removal,  . CORONARY ANGIOPLASTY WITH STENT PLACEMENT     Stent 2007  . CORONARY ARTERY BYPASS GRAFT    . ESOPHAGEAL MANOMETRY N/A 01/08/2016   Procedure: ESOPHAGEAL MANOMETRY (EM);  Surgeon: Meryl Dare, MD;  Location: [facility]  ENDOS[facility];  Service: Endoscopy;  Laterality: N/A;  . open heart surgery     01/19/2010    There were no vitals filed for this visit.      Subjective Assessment - 09/16/16 1620    Subjective I have been feeling so tired. I have done a few of the exercises you gave me.    Pertinent History 2012 L breast cancer status post left lumpectomy and sentinel lymph node biopsy December of 2012 for a T2 N0, stage IIA invasive ductal carcinoma, grade 3, HER-2 not amplified, strongly estrogen and progesterone receptor positive with an elevated proliferation marker at 72%; , Feb 06, 2011- lumpectomy, completed radiation, open heart surgery in 2011 - 3 bypasses, fibromyalgia   Patient Stated Goals to get swelling down and know how to manage it   Currently in Pain? No/denies                         [facility] Adult PT Treatment/Exercise - 09/16/16 0001      Shoulder Exercises: Standing   Other Standing Exercises Instructed pt in Strength ABC program through rows- stopped here, pt held all stretches for 15 sec and performed all exercises 10 reps with 1 lb weights, pt wanted to review same exercises she was instructed in last session because she had questions about exercises- pt required  min-mod verbal cues and some tactile cues     Manual Therapy   Edema Management pt's custom compression sleeve arrived and the length was appropriate, pt educated to wear this and glove during day time hours, instructed pt in donning/doffing and pt was able to do so independently                   [facility] Goals - 08/26/16 0849      CC Short Term Goal  #1   Title Patient will be able to independently verbalize lymphedema risk reduction practices.   Baseline 08/12/16- pt required some cueing in order to verbalize precautions, 08/26/16- pt able to recall these independently   [facility] Goals - 09/16/16 1703      CC Long  Term Goal  #1   Title Patient will obtain appropriate compression garments including sleeve and glove for long term management of edema   Baseline 08/26/16- pt awaiting arrival of compression garments, 09/16/16- pt has received appropriate sleeve and glove with assess how pt tolerated at next session   Time 6   Period Weeks   Status On-going     CC Long Term Goal  #2   Title Pt will be able to perform self drainage to L UE and breast for long term management of edema   Baseline 08/26/16- pt still requiring cues with breast lymphedema   Time 6   Period Weeks   Status On-going     CC Long Term Goal  #3   Title Pt will be independent in a strengthening program for bilateral shoulders to return to prior level of function   Baseline 09/16/16- still educating pt in Strength ABC program   Time 6   Period Weeks   Status On-going     CC Long Term Goal  #4   Title Pt will report a at least a 65% improvement in swelling in left upper extremity to allow increased comfort.    Baseline 08/26/16- 50% improvement   Time 6   Period Weeks   Status On-going            Plan - 09/16/16 1657    Clinical Impression Statement Pt's new sleeve arrived. Assessed sleeve for proper fit. The length is now appropriate. Instructed pt in donning/doffing which pt is able to complete independently. Educated pt to wear sleeve and glove during waking hours only. Continued instruction in strength ABC program today since pt had questions about correct way to perform exericses. Pt would benefit from additional skilled PT visits to continue to progress pt towards goals in therapy and progress her towards independence with a home exercise program.    Rehab Potential Good   Clinical Impairments Affecting Rehab Potential hx of radiation   PT Frequency 1x / week   PT Duration 6 weeks   PT Treatment/Interventions ADLs/Self Care Home Management;Therapeutic exercise;Patient/family education;Manual lymph drainage;Compression  bandaging;Scar mobilization;Manual techniques;Taping   PT Next Visit Plan  assess independence with MLD to L breast, continue instruction in strength ABC program- begin at rows, assess hand swelling since pt has been using sleeve and glove   Consulted and Agree with Plan of Care Patient      Patient will benefit from skilled therapeutic intervention in order to improve the following deficits and impairments:  Pain, Decreased strength, Increased edema, Decreased knowledge of  precautions  Visit Diagnosis: Muscle weakness (generalized) - Plan: PT plan of care cert/re-cert  Lymphedema, not elsewhere classified - Plan: PT plan of care cert/re-cert     Problem List Patient Active Problem List   Diagnosis Date Noted  . Abdominal pain, epigastric 04/11/2016  . Nausea 04/11/2016  . 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  . Belching 01/03/2016  . Bloating 05/23/2015  . Diarrhea 05/23/2015  . Heartburn 05/23/2015  . Bilateral occipital neuralgia 11/23/2014  . Cervicogenic headache 11/23/2014  . Neck pain 11/23/2014  . OSA on CPAP 11/23/2014  . Generalized anxiety disorder 02/04/2014  . Labile hypertension   . Fibromyalgia   . Cervical spondylolysis 12/24/2013  . CAD (coronary artery disease) 12/24/2013  . Hypertensive emergency 12/23/2013  . Acute encephalopathy 12/23/2013  . Bronchitis, chronic obstructive w acute bronchitis ([place]) 12/21/2013  . Diverticulosis of colon without hemorrhage 11/15/2013  . Hot flashes related to aromatase inhibitor therapy 07/27/2013  . Osteopenia 07/27/2013  . DJD (degenerative joint disease) 09/29/2012  . Breast cancer, Left 12/20/2010  . Leg pain 11/28/2010  . FATTY LIVER DISEASE 01/09/2010  . GASTRITIS 12/16/2007  . Allergic rhinitis 02/18/2007  . DIZZINESS, CHRONIC 02/18/2007  . Headache in back of head 02/18/2007  . Mixed hyperlipidemia 12/23/2006  . GLAUCOMA 12/23/2006  . Coronary atherosclerosis 12/23/2006  . Asthma 12/23/2006  .  Gastroesophageal reflux disease 12/23/2006  . IRRITABLE BOWEL SYNDROME 12/23/2006  . Myalgia and myositis 12/23/2006    Allyson Sabal [facility] 09/16/2016, 5:05 PM  [place], [place], [address] Phone: [phone]   Fax:  [phone]  Name: SAMAMTHA TIEGS MRN: [id number] Date of Birth: [birth year]

## 2016-09-23 ENCOUNTER — Ambulatory Visit: Payer: Medicare Other | Admitting: Physical Therapy

## 2016-09-23 ENCOUNTER — Encounter: Payer: Self-pay | Admitting: Physical Therapy

## 2016-09-23 DIAGNOSIS — M6281 Muscle weakness (generalized): Secondary | ICD-10-CM

## 2016-09-23 DIAGNOSIS — I890 Lymphedema, not elsewhere classified: Secondary | ICD-10-CM | POA: Diagnosis not present

## 2016-09-23 NOTE — Therapy (Signed)
[place], [place], [address] Phone: [phone]   Fax:  [phone]  Physical Therapy Treatment  Patient Details  Name: [name] MRN: [id number] Date of Birth: [birth year] Referring Provider: Magrinat  Encounter Date: 09/23/2016      PT End of Session - 09/23/16 1217    Visit Number 10   Number of Visits 13   Date for PT Re-Evaluation 10/07/16   PT Start Time 0932   PT Stop Time 1020   PT Time Calculation (min) 48 min   Activity Tolerance Patient tolerated treatment well   Behavior During Therapy [facility] for tasks assessed/performed      Past Medical History:  Diagnosis Date  . Asthma   . Atrophic vaginitis   . Breast cancer, Left 12/20/2010   NO BLOOD PRESSURE CHECKS OR STICKS IN LEFT ARM  . CAD (coronary artery disease)    a. 01/19/2010 s/p CABG x 3, lima->lad, vg->diag, vg->om1;  b. 06/2011 :Lexi MV: EF 84%, No ischemia. c. 01/06/14 s/p negative nuclear stress test with EF >70%  . Cervical arthritis ([place])   . Colonic polyp 04-27-2009   tubular adenoma  . Diverticulosis of colon (without mention of hemorrhage)   . Fibromyalgia   . Gallstones   . GERD (gastroesophageal reflux disease)   . Glaucoma   . Headache(784.0)    a. frequently assocaited with high BPs  . Hiatal hernia   . Hypercholesterolemia   . Irritable bowel syndrome   . Labile hypertension     Past Surgical History:  Procedure Laterality Date  . [place] STUDY N/A 01/08/2016   Procedure: [place] STUDY;  Surgeon: Ladene Artist, MD;  [facility];  Service: Endoscopy;  Laterality: N/A;  . BREAST LUMPECTOMY Left 02/06/11  . CATARACT EXTRACTION, BILATERAL     bilateral caaract removal,  . CORONARY ANGIOPLASTY WITH STENT PLACEMENT     Stent 2007  . CORONARY ARTERY BYPASS GRAFT    . ESOPHAGEAL MANOMETRY N/A 01/08/2016   Procedure: ESOPHAGEAL MANOMETRY (EM);  Surgeon: Ladene Artist, MD;  [facility];  Service: Endoscopy;  Laterality: N/A;  . open heart surgery     01/19/2010    There were no vitals filed for this visit.      Subjective Assessment - 09/23/16 0933    Subjective I have been wearing my sleeve and glove most days. I did not wear it to church. I think it is controlling my swelling. My exercises are going but not everyday. I have questions about them.    Pertinent History 2012 L breast cancer status post left lumpectomy and sentinel lymph node biopsy December of 2012 for a T2 N0, stage IIA invasive ductal carcinoma, grade 3, HER-2 not amplified, strongly estrogen and progesterone receptor positive with an elevated proliferation marker at 72%; , Feb 06, 2011- lumpectomy, completed radiation, open heart surgery in 2011 - 3 bypasses, fibromyalgia   Patient Stated Goals to get swelling down and know how to manage it   Currently in Pain? No/denies               LYMPHEDEMA/ONCOLOGY QUESTIONNAIRE - 09/23/16 0937      Left Upper Extremity Lymphedema   15 cm Proximal to Olecranon Process 27.5 cm   Olecranon Process 24.5 cm   15 cm Proximal to Ulnar Styloid Process 24 cm   Just Proximal to Ulnar Styloid Process 16 cm   Across Hand at [employer]  Space 18.5 cm   At [facility] of 2nd Digit 6.2 cm                  [facility] Adult PT Treatment/Exercise - 09/23/16 0001      Shoulder Exercises: Standing   Other Standing Exercises Instructed pt in Strength ABC program through rows- stopped here, pt held all stretches for 15 sec and performed all exercises 10 reps with 1 lb weights, went through entire packet except for step exercise. Pt only required min cueing this visit and is becoming more independent with these exercises     Manual Therapy   Edema Management circumferential measurements taken today                   [facility] Goals - 08/26/16 0849      CC Short Term Goal  #1   Title Patient will be able to independently verbalize lymphedema  risk reduction practices.   Baseline 08/12/16- pt required some cueing in order to verbalize precautions, 08/26/16- pt able to recall these independently   Time [facility] Goals - 09/16/16 1703      CC Long Term Goal  #1   Title Patient will obtain appropriate compression garments including sleeve and glove for long term management of edema   Baseline 08/26/16- pt awaiting arrival of compression garments, 09/16/16- pt has received appropriate sleeve and glove with assess how pt tolerated at next session   Time 6   Period Weeks   Status On-going     CC Long Term Goal  #2   Title Pt will be able to perform self drainage to L UE and breast for long term management of edema   Baseline 08/26/16- pt still requiring cues with breast lymphedema   Time 6   Period Weeks   Status On-going     CC Long Term Goal  #3   Title Pt will be independent in a strengthening program for bilateral shoulders to return to prior level of function   Baseline 09/16/16- still educating pt in Strength ABC program   Time 6   Period Weeks   Status On-going     CC Long Term Goal  #4   Title Pt will report a at least a 65% improvement in swelling in left upper extremity to allow increased comfort.    Baseline 08/26/16- 50% improvement   Time 6   Period Weeks   Status On-going            Plan - 09/23/16 1217    Clinical Impression Statement Pt has been wearing her compression sleeve and glove and it has been managing her swelling. Instructed pt in entirety of strength ABC program. Pt is requiring less cueing with her exercise program and will be ready for discharge soon.    Rehab Potential Good   Clinical Impairments Affecting Rehab Potential hx of radiation   PT Frequency 1x / week   PT Duration 6 weeks   PT Treatment/Interventions ADLs/Self Care Home Management;Therapeutic exercise;Patient/family education;Manual lymph drainage;Compression bandaging;Scar  mobilization;Manual techniques;Taping   PT Next Visit Plan assess indep in strength ABC program, d/c when indep   Consulted and Agree with Plan of Care Patient      Patient will benefit from skilled therapeutic intervention in order to improve the following deficits and impairments:  Pain, Decreased strength, Increased  edema, Decreased knowledge of precautions  Visit Diagnosis: Muscle weakness (generalized)       G-Codes - 15-Oct-2016 05/24/17    Functional Assessment Tool Used (Outpatient Only) per clinical judgement   Functional Limitation Other PT primary   Other PT Primary Current Status (Q1194) 0 percent impaired, limited or restricted   Other PT Primary Goal Status (R7408) 0 percent impaired, limited or restricted      Problem List Patient Active Problem List   Diagnosis Date Noted  . Abdominal pain, epigastric 04/11/2016  . Nausea 04/11/2016  . 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  . Belching 01/03/2016  . Bloating 05/23/2015  . Diarrhea 05/23/2015  . Heartburn 05/23/2015  . Bilateral occipital neuralgia 11/23/2014  . Cervicogenic headache 11/23/2014  . Neck pain 11/23/2014  . OSA on CPAP 11/23/2014  . Generalized anxiety disorder 02/04/2014  . Labile hypertension   . Fibromyalgia   . Cervical spondylolysis 12/24/2013  . CAD (coronary artery disease) 12/24/2013  . Hypertensive emergency 12/23/2013  . Acute encephalopathy 12/23/2013  . Bronchitis, chronic obstructive w acute bronchitis ([place]) 12/21/2013  . Diverticulosis of colon without hemorrhage 11/15/2013  . Hot flashes related to aromatase inhibitor therapy 07/27/2013  . Osteopenia 07/27/2013  . DJD (degenerative joint disease) 09/29/2012  . Breast cancer, Left 12/20/2010  . Leg pain 11/28/2010  . FATTY LIVER DISEASE 01/09/2010  . GASTRITIS 12/16/2007  . Allergic rhinitis 02/18/2007  . DIZZINESS, CHRONIC 02/18/2007  . Headache in back of head 02/18/2007  . Mixed hyperlipidemia 12/23/2006  . GLAUCOMA  12/23/2006  . Coronary atherosclerosis 12/23/2006  . Asthma 12/23/2006  . Gastroesophageal reflux disease 12/23/2006  . IRRITABLE BOWEL SYNDROME 12/23/2006  . Myalgia and myositis 12/23/2006    Allyson Sabal [facility] Oct 15, 2016, 12:21 PM  [place], [place], [address] Phone: [phone]   Fax:  [phone]  Name: DEZI BRAUNER MRN: [id number] Date of Birth: 29-Jan-1936  Manus Gunning, PT 2016-10-15 12:21 PM

## 2016-09-24 DIAGNOSIS — J301 Allergic rhinitis due to pollen: Secondary | ICD-10-CM | POA: Diagnosis not present

## 2016-09-24 DIAGNOSIS — J3089 Other allergic rhinitis: Secondary | ICD-10-CM | POA: Diagnosis not present

## 2016-10-09 MED FILL — ROSUVASTATIN CALCIUM 5 MG T: 5 | 70 days supply | Qty: 30 | Fill #1

## 2016-10-11 ENCOUNTER — Encounter: Payer: Self-pay | Admitting: Physical Therapy

## 2016-10-11 ENCOUNTER — Ambulatory Visit: Payer: Medicare Other | Attending: Oncology | Admitting: Physical Therapy

## 2016-10-11 DIAGNOSIS — M6281 Muscle weakness (generalized): Secondary | ICD-10-CM | POA: Insufficient documentation

## 2016-10-11 NOTE — Therapy (Signed)
[place], [place], [address] Phone: [phone]   Fax:  [phone]  Physical Therapy Treatment  Patient Details  Name: [name] MRN: [id number] Date of Birth: 01/11/1936 Referring Provider: Magrinat  Encounter Date: 10/11/2016      PT End of Session - 10/11/16 1005    Visit Number 11   Number of Visits 13   Date for PT Re-Evaluation 10/07/16   PT Start Time 0933   PT Stop Time 1022   PT Time Calculation (min) 49 min   Activity Tolerance Patient tolerated treatment well   Behavior During Therapy [facility] for tasks assessed/performed      Past Medical History:  Diagnosis Date  . Asthma   . Atrophic vaginitis   . Breast cancer, Left 12/20/2010   NO BLOOD PRESSURE CHECKS OR STICKS IN LEFT ARM  . CAD (coronary artery disease)    a. 01/19/2010 s/p CABG x 3, lima->lad, vg->diag, vg->om1;  b. 06/2011 :Lexi MV: EF 84%, No ischemia. c. 01/06/14 s/p negative nuclear stress test with EF >70%  . Cervical arthritis ([place])   . Colonic polyp 04-27-2009   tubular adenoma  . Diverticulosis of colon (without mention of hemorrhage)   . Fibromyalgia   . Gallstones   . GERD (gastroesophageal reflux disease)   . Glaucoma   . Headache(784.0)    a. frequently assocaited with high BPs  . Hiatal hernia   . Hypercholesterolemia   . Irritable bowel syndrome   . Labile hypertension     Past Surgical History:  Procedure Laterality Date  . [place] STUDY N/A 01/08/2016   Procedure: [place] STUDY;  Surgeon: Ladene Artist, MD;  Location: [facility];  Service: Endoscopy;  Laterality: N/A;  . BREAST LUMPECTOMY Left 02/06/11  . CATARACT EXTRACTION, BILATERAL     bilateral caaract removal,  . CORONARY ANGIOPLASTY WITH STENT PLACEMENT     Stent 2007  . CORONARY ARTERY BYPASS GRAFT    . ESOPHAGEAL MANOMETRY N/A 01/08/2016   Procedure: ESOPHAGEAL MANOMETRY (EM);  Surgeon: Ladene Artist, MD;  Location: [facility]  ENDOSCOPY;  Service: Endoscopy;  Laterality: N/A;  . open heart surgery     01/19/2010    There were no vitals filed for this visit.      Subjective Assessment - 10/11/16 0937    Subjective Pt states she is wearing her sleeve part of the day but not everyday. I feel like my swelling is controlled. I have been doing my exercises but not everyday. I have been trying to get back in to walking. I think I understand all the exercises but one of them.    Pertinent History 2012 L breast cancer status post left lumpectomy and sentinel lymph node biopsy December of 2012 for a T2 N0, stage IIA invasive ductal carcinoma, grade 3, HER-2 not amplified, strongly estrogen and progesterone receptor positive with an elevated proliferation marker at 72%; , Feb 06, 2011- lumpectomy, completed radiation, open heart surgery in 2011 - 3 bypasses, fibromyalgia   Patient Stated Goals to get swelling down and know how to manage it   Currently in Pain? No/denies               LYMPHEDEMA/ONCOLOGY QUESTIONNAIRE - 10/11/16 0939      Left Upper Extremity Lymphedema   15 cm Proximal to Olecranon Process 27 cm   Olecranon Process 23.8 cm   15 cm Proximal to Ulnar Styloid Process 24 cm  Just Proximal to Ulnar Styloid Process 15.8 cm   Across Hand at [facility] 18.5 cm   At [place] of 2nd Digit 6.2 cm                  [facility] Adult PT Treatment/Exercise - 10/11/16 0001      Shoulder Exercises: Standing   Other Standing Exercises Instructed pt in Strength ABC program, pt held all stretches for 15 sec and performed all exercises 10 reps with 2 lb weights, went through entire packet.       Manual Therapy   Edema Management circumferential measurements taken today                   Short Term Clinic Goals - 08/26/16 0849      CC Short Term Goal  #1   Title Patient will be able to independently verbalize lymphedema risk reduction practices.   Baseline 08/12/16- pt required some cueing  in order to verbalize precautions, 08/26/16- pt able to recall these independently   Time 3   Period Weeks   Status Achieved             Long Term Clinic Goals - 10/11/16 1002      CC Long Term Goal  #1   Title Patient will obtain appropriate compression garments including sleeve and glove for long term management of edema   Baseline 08/26/16- pt awaiting arrival of compression garments, 09/16/16- pt has received appropriate sleeve and glove with assess how pt tolerated at next session, 8/171/18- pt obtained appropriately fitting sleeve and glove that is containing her swelling   Time 6   Period Weeks   Status Achieved     CC Long Term Goal  #2   Title Pt will be able to perform self drainage to L UE and breast for long term management of edema   Baseline 08/26/16- pt still requiring cues with breast lymphedema, 10/11/16- pt reports she is independent with this and has been performing at home   Time 6   Period Weeks   Status Achieved     CC Long Term Goal  #3   Title Pt will be independent in a strengthening program for bilateral shoulders to return to prior level of function   Baseline 09/16/16- still educating pt in Strength ABC program, 10/11/16- pt demonstrates independence with this today   Time 6   Period Weeks   Status Achieved     CC Long Term Goal  #4   Title Pt will report a at least a 65% improvement in swelling in left upper extremity to allow increased comfort.    Baseline 08/26/16- 50% improvement, 10/11/16- 90% improvement   Time 6   Period Weeks   Status Achieved            Plan - 10/11/16 1005    Clinical Impression Statement Pt has now met all goals for therapy. She is able to independently manage her lymphedema through use of self manual lymphatic drainage and use of a flat knit compression sleeve and glove. She reports 90% improvement in swelling in her LUE. Pt able to complete Strength ABC program independently today during therapy and was able to tolerate  more resistance. Pt to be discharged from skilled PT services at this time.    Rehab Potential Good   Clinical Impairments Affecting Rehab Potential hx of radiation   PT Frequency 1x / week   PT Duration 6 weeks   PT Treatment/Interventions  ADLs/Self Care Home Management;Therapeutic exercise;Patient/family education;Manual lymph drainage;Compression bandaging;Scar mobilization;Manual techniques;Taping   PT Next Visit Plan d/c this visit   PT Home Exercise Plan strength ABC, self MLD to breast and UE   Consulted and Agree with Plan of Care Patient      Patient will benefit from skilled therapeutic intervention in order to improve the following deficits and impairments:  Pain, Decreased strength, Increased edema, Decreased knowledge of precautions  Visit Diagnosis: Muscle weakness (generalized)       G-Codes - 21-Oct-2016 1010    Functional Assessment Tool Used (Outpatient Only) per clinical judgement   Functional Limitation Other PT primary   Other PT Primary Goal Status (T7001) 0 percent impaired, limited or restricted   Other PT Primary Discharge Status (V4944) 0 percent impaired, limited or restricted      Problem List Patient Active Problem List   Diagnosis Date Noted  . Abdominal pain, epigastric 04/11/2016  . Nausea 04/11/2016  . 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  . Belching 01/03/2016  . Bloating 05/23/2015  . Diarrhea 05/23/2015  . Heartburn 05/23/2015  . Bilateral occipital neuralgia 11/23/2014  . Cervicogenic headache 11/23/2014  . Neck pain 11/23/2014  . OSA on CPAP 11/23/2014  . Generalized anxiety disorder 02/04/2014  . Labile hypertension   . Fibromyalgia   . Cervical spondylolysis 12/24/2013  . CAD (coronary artery disease) 12/24/2013  . Hypertensive emergency 12/23/2013  . Acute encephalopathy 12/23/2013  . Bronchitis, chronic obstructive w acute bronchitis ([place]) 12/21/2013  . Diverticulosis of colon without hemorrhage 11/15/2013  . Hot flashes  related to aromatase inhibitor therapy 07/27/2013  . Osteopenia 07/27/2013  . DJD (degenerative joint disease) 09/29/2012  . Breast cancer, Left 12/20/2010  . Leg pain 11/28/2010  . FATTY LIVER DISEASE 01/09/2010  . GASTRITIS 12/16/2007  . Allergic rhinitis 02/18/2007  . DIZZINESS, CHRONIC 02/18/2007  . Headache in back of head 02/18/2007  . Mixed hyperlipidemia 12/23/2006  . GLAUCOMA 12/23/2006  . Coronary atherosclerosis 12/23/2006  . Asthma 12/23/2006  . Gastroesophageal reflux disease 12/23/2006  . IRRITABLE BOWEL SYNDROME 12/23/2006  . Myalgia and myositis 12/23/2006    Allyson Sabal [facility] 10-21-2016, 12:45 PM  [place], [place], [address] Phone: [phone]   Fax:  [phone]  Name: MARLINE MORACE MRN: [id number] Date of Birth: 1935/07/29  PHYSICAL THERAPY DISCHARGE SUMMARY  Visits from Start of Care: 11  Current functional level related to goals / functional outcomes: Pt now independent with a home exercise program and self drainage for management of swelling, has compression sleeve and glove   Remaining deficits: none  Education / Equipment: HEP, compression sleeve and glove Plan: Patient agrees to discharge.  Patient goals were met. Patient is being discharged due to meeting the stated rehab goals.  ?????    Allyson Sabal [place], [place] Oct 21, 2016 12:47 PM

## 2016-10-18 ENCOUNTER — Encounter: Payer: Self-pay | Admitting: Physical Therapy

## 2016-10-21 DIAGNOSIS — J301 Allergic rhinitis due to pollen: Secondary | ICD-10-CM | POA: Diagnosis not present

## 2016-10-21 DIAGNOSIS — J3089 Other allergic rhinitis: Secondary | ICD-10-CM | POA: Diagnosis not present

## 2016-11-05 ENCOUNTER — Ambulatory Visit: Payer: Medicare Other | Admitting: Nurse Practitioner

## 2016-11-05 NOTE — Progress Notes (Signed)
Patient ID: [name], female   DOB: 11-24-1935, 81 y.o.   MRN: [id number]  81 y.o. with HTN, HLD, Fibromyalgia, IBS and GERD. History of CAD and CABG 2011. 12/2013 nonischemic myovue and normal EF by echo. Tends to get hyponatremic with diuretics. Intolerant to statins and refuses to Try any more. Some issues with headaches and GERD.  ROS: Denies fever, malais, weight loss, blurry vision, decreased visual acuity, cough, sputum, SOB, hemoptysis, pleuritic pain, palpitaitons, heartburn, abdominal pain, melena, lower extremity edema, claudication, or rash.  All other systems reviewed and negative  General: Vitals:   11/14/16 0915  BP: 128/70  Pulse: 64    Affect appropriate Overweight white female  HEENT: normal Neck supple with no adenopathy JVP normal no bruits no thyromegaly Lungs clear with no wheezing and good diaphragmatic motion Heart:  S1/S2 no murmur, no rub, gallop or click PMI normal Abdomen: benighn, BS positve, no tenderness, no AAA no bruit.  No HSM or HJR Distal pulses intact with no bruits No edema Neuro non-focal Skin warm and dry No muscular weakness    Current Outpatient Prescriptions  Medication Sig Dispense Refill  . Acetaminophen (TYLENOL 8 HOUR PO) Take by mouth as directed.     [name] albuterol (VENTOLIN HFA) 108 (90 Base) MCG/ACT inhaler Inhale 2 puffs into the lungs every 6 (six) hours as needed for wheezing or shortness of breath.    [name] amLODipine (NORVASC) 5 MG tablet Take 1 tablet (5 mg total) by mouth daily. 90 tablet 3  . aspirin EC 81 MG tablet Take 81 mg by mouth daily.    . Calcium & Magnesium Carbonates (MYLANTA PO) Take by mouth. 1-2 teaspoons as needed    . fexofenadine (ALLEGRA) 180 MG tablet Take 180 mg by mouth daily.      . fluocinonide (LIDEX) 0.05 % external solution Apply 1 application topically daily as needed (scalp itching).     . Glucosamine-Chondroit-Vit C-Mn (GLUCOSAMINE CHONDR 1500 COMPLX PO) Take 1 tablet by mouth 3 (three)  times daily.      . hydrALAZINE (APRESOLINE) 50 MG tablet take 1/2 tablet by mouth three times a day 45 tablet 9  . ketoconazole (NIZORAL) 2 % shampoo Apply 1 application topically 2 (two) times a week. As needed for itching a couple of times week    . latanoprost (XALATAN) 0.005 % ophthalmic solution Place 1 drop into both eyes at bedtime.     [name] losartan (COZAAR) 100 MG tablet take 1/2 tablet by mouth three times a day 45 tablet 5  . magnesium oxide (MAG-OX) 400 MG tablet Take 400 mg by mouth daily. Reported on 02/14/2015    . metoprolol (LOPRESSOR) 50 MG tablet take 1 tablet by mouth three times a day 270 tablet 2  . mometasone (NASONEX) 50 MCG/ACT nasal spray Place 2 sprays into the nose daily as needed (congestion).     . nitroGLYCERIN (NITROSTAT) 0.4 MG SL tablet Place 1 tablet (0.4 mg total) under the tongue every 5 (five) minutes as needed for chest pain. 25 tablet 2  . Omega-3 Fatty Acids (FISH OIL) 1200 MG CAPS Take 1,200 mg by mouth daily. Reported on 02/14/2015    . pantoprazole (PROTONIX) 40 MG tablet Take 1 tablet (40 mg total) by mouth daily. 30 tablet 6  . Psyllium (METAMUCIL FIBER PO) Take 1 tablet by mouth daily.     . ranitidine (ZANTAC) 300 MG capsule Take 1 capsule (300 mg total) by mouth every evening. 30 capsule 3  .  rosuvastatin (CRESTOR) 5 MG tablet Take 1 tablet (5 mg total) by mouth 3 (three) times a week. (take on Monday, Wednesday, and Friday) 30 tablet 11  . sucralfate (CARAFATE) 1 GM/10ML suspension Take 10 mLs (1 g total) by mouth 4 (four) times daily. 420 mL 11  . traMADol (ULTRAM) 50 MG tablet Take 1/2 to 1 tablet by mouth three times daily as needed 90 tablet 5  . vitamin B-12 (CYANOCOBALAMIN) 1000 MCG tablet Take 1,000 mcg by mouth daily.       No current facility-administered medications for this visit.     Allergies  Levaquin [levofloxacin in d5w]; Choline fenofibrate; Hctz [hydrochlorothiazide]; Simvastatin; Adhesive [tape]; Bentyl [dicyclomine hcl];  Ceclor [cefaclor]; Clarithromycin; Codeine; Doxycycline; Hydrocodone; Lisinopril; Penicillins; and Tobramycin-dexamethasone  Electrocardiogram:  SR rate 64  Normal LAE 01/14/14  10/11/15 SR rate 59 LVH no change 11/14/16 SR rate 64 LVH no acute changes   Assessment and Plan  CAD/CABG: 2011  non ishcemic myovue 2015  She will call if has to take nitro continue ASA/Beta blocker Some esophageal dysmotility causes dysphagia and ? Chest pain that confused picture  HTN:  Improved on amlodipine   Breast Cancer   F/U Magrinat No BP's on left arm  mamogram last year ok  Chol:   Lab Results  Component Value Date   LDLCALC 110 (H) 04/06/2012   Labs with Dr Ree Kida to statin    Headaches:  Seen by neurology cervicogenic ? Nerve block injection x1 continue PT gabapentin has been d/c   GI:  Carafate , protonix, ranitidine help GERD HIDA scan 04/19/16 normal IBgard and metamucil for IBS Will f/u with Dr Fuller Plan  F/U me in a year   Jenkins Rouge

## 2016-11-14 ENCOUNTER — Encounter: Payer: Self-pay | Admitting: Cardiovascular Disease

## 2016-11-14 ENCOUNTER — Ambulatory Visit (INDEPENDENT_AMBULATORY_CARE_PROVIDER_SITE_OTHER): Payer: Medicare Other | Admitting: Cardiovascular Disease

## 2016-11-14 VITALS — BP 128/70 | HR 64 | Ht 58.75 in | Wt 136.0 lb

## 2016-11-14 DIAGNOSIS — I2583 Coronary atherosclerosis due to lipid rich plaque: Secondary | ICD-10-CM

## 2016-11-14 DIAGNOSIS — J3089 Other allergic rhinitis: Secondary | ICD-10-CM | POA: Diagnosis not present

## 2016-11-14 DIAGNOSIS — I2510 Atherosclerotic heart disease of native coronary artery without angina pectoris: Secondary | ICD-10-CM | POA: Diagnosis not present

## 2016-11-14 DIAGNOSIS — J301 Allergic rhinitis due to pollen: Secondary | ICD-10-CM | POA: Diagnosis not present

## 2016-11-14 NOTE — Patient Instructions (Signed)

## 2016-11-23 ENCOUNTER — Other Ambulatory Visit: Payer: Self-pay | Admitting: Cardiovascular Disease

## 2016-11-28 DIAGNOSIS — J3089 Other allergic rhinitis: Secondary | ICD-10-CM | POA: Diagnosis not present

## 2016-11-28 DIAGNOSIS — J301 Allergic rhinitis due to pollen: Secondary | ICD-10-CM | POA: Diagnosis not present

## 2016-12-04 ENCOUNTER — Other Ambulatory Visit: Payer: Self-pay | Admitting: Pulmonary Disease

## 2016-12-09 MED FILL — AMLODIPINE BESYLATE 5 MG TA: 5 | 90 days supply | Qty: 90 | Fill #2

## 2016-12-10 DIAGNOSIS — J301 Allergic rhinitis due to pollen: Secondary | ICD-10-CM | POA: Diagnosis not present

## 2016-12-10 DIAGNOSIS — J3089 Other allergic rhinitis: Secondary | ICD-10-CM | POA: Diagnosis not present

## 2016-12-19 ENCOUNTER — Ambulatory Visit (INDEPENDENT_AMBULATORY_CARE_PROVIDER_SITE_OTHER): Payer: Medicare Other | Admitting: Pulmonary Disease

## 2016-12-19 VITALS — BP 158/68 | HR 57 | Temp 97.4°F | Ht 58.4 in | Wt 136.1 lb

## 2016-12-19 DIAGNOSIS — Z951 Presence of aortocoronary bypass graft: Secondary | ICD-10-CM | POA: Insufficient documentation

## 2016-12-19 DIAGNOSIS — J3089 Other allergic rhinitis: Secondary | ICD-10-CM | POA: Diagnosis not present

## 2016-12-19 DIAGNOSIS — Z9989 Dependence on other enabling machines and devices: Secondary | ICD-10-CM

## 2016-12-19 DIAGNOSIS — E782 Mixed hyperlipidemia: Secondary | ICD-10-CM | POA: Diagnosis not present

## 2016-12-19 DIAGNOSIS — K219 Gastro-esophageal reflux disease without esophagitis: Secondary | ICD-10-CM

## 2016-12-19 DIAGNOSIS — C50512 Malignant neoplasm of lower-outer quadrant of left female breast: Secondary | ICD-10-CM | POA: Diagnosis not present

## 2016-12-19 DIAGNOSIS — Z23 Encounter for immunization: Secondary | ICD-10-CM

## 2016-12-19 DIAGNOSIS — L600 Ingrowing nail: Secondary | ICD-10-CM

## 2016-12-19 DIAGNOSIS — J4520 Mild intermittent asthma, uncomplicated: Secondary | ICD-10-CM | POA: Diagnosis not present

## 2016-12-19 DIAGNOSIS — R0989 Other specified symptoms and signs involving the circulatory and respiratory systems: Secondary | ICD-10-CM | POA: Diagnosis not present

## 2016-12-19 DIAGNOSIS — I2510 Atherosclerotic heart disease of native coronary artery without angina pectoris: Secondary | ICD-10-CM | POA: Diagnosis not present

## 2016-12-19 DIAGNOSIS — F411 Generalized anxiety disorder: Secondary | ICD-10-CM

## 2016-12-19 DIAGNOSIS — K589 Irritable bowel syndrome without diarrhea: Secondary | ICD-10-CM

## 2016-12-19 DIAGNOSIS — I2583 Coronary atherosclerosis due to lipid rich plaque: Secondary | ICD-10-CM

## 2016-12-19 DIAGNOSIS — Z170 Estrogen receptor positive status [ER+]: Secondary | ICD-10-CM

## 2016-12-19 DIAGNOSIS — G4733 Obstructive sleep apnea (adult) (pediatric): Secondary | ICD-10-CM

## 2016-12-19 NOTE — Patient Instructions (Signed)
Today we updated your med list in our EPIC system...    Continue your current medications the same...  Please return to our lab one morning soon for your FASTING lipid profile (so we can assess the benefit from the low dose crestor5mg  on MWF).Marland KitchenMarland Kitchen We will contact you w/ the results when available...   Today we gave you [id number] FLU vaccine...  We will make a referral to Podiatry per your request...  Call for any questions...  Let's plan a follow up visit in 11mo, sooner if needed for problems.Marland KitchenMarland Kitchen

## 2016-12-20 ENCOUNTER — Telehealth: Payer: Self-pay | Admitting: Gastroenterology

## 2016-12-20 NOTE — Telephone Encounter (Signed)
Patient with abdominal pain.  She has stopped her carafate a few months ago.  She will resume it for a few weeks and if this does not improve her symptoms she will call back

## 2016-12-25 DIAGNOSIS — M79675 Pain in left toe(s): Secondary | ICD-10-CM | POA: Diagnosis not present

## 2016-12-25 DIAGNOSIS — M79674 Pain in right toe(s): Secondary | ICD-10-CM | POA: Diagnosis not present

## 2016-12-25 DIAGNOSIS — L03032 Cellulitis of left toe: Secondary | ICD-10-CM | POA: Diagnosis not present

## 2016-12-25 DIAGNOSIS — L03031 Cellulitis of right toe: Secondary | ICD-10-CM | POA: Diagnosis not present

## 2016-12-26 DIAGNOSIS — J3089 Other allergic rhinitis: Secondary | ICD-10-CM | POA: Diagnosis not present

## 2016-12-26 DIAGNOSIS — J301 Allergic rhinitis due to pollen: Secondary | ICD-10-CM | POA: Diagnosis not present

## 2016-12-27 ENCOUNTER — Other Ambulatory Visit (INDEPENDENT_AMBULATORY_CARE_PROVIDER_SITE_OTHER): Payer: Medicare Other

## 2016-12-27 DIAGNOSIS — J301 Allergic rhinitis due to pollen: Secondary | ICD-10-CM | POA: Diagnosis not present

## 2016-12-27 DIAGNOSIS — J3089 Other allergic rhinitis: Secondary | ICD-10-CM | POA: Diagnosis not present

## 2016-12-27 DIAGNOSIS — E782 Mixed hyperlipidemia: Secondary | ICD-10-CM

## 2016-12-27 LAB — LIPID PANEL
CHOL/HDL RATIO: 3
Cholesterol: 151 mg/dL (ref 0–200)
HDL: 54.9 mg/dL (ref >39.00)
LDL Cholesterol: 61 mg/dL (ref 0–99)
NonHDL: 95.64
TRIGLYCERIDES: 173 mg/dL — AB (ref 0.0–149.0)
VLDL: 34.6 mg/dL (ref 0.0–40.0)

## 2016-12-27 LAB — HEPATIC FUNCTION PANEL
ALK PHOS: 70 U/L (ref 39–117)
ALT: 16 U/L (ref 0–35)
AST: 16 U/L (ref 0–37)
Albumin: 4.3 g/dL (ref 3.5–5.2)
BILIRUBIN DIRECT: 0.2 mg/dL (ref 0.0–0.3)
BILIRUBIN TOTAL: 1.2 mg/dL (ref 0.2–1.2)
TOTAL PROTEIN: 7.3 g/dL (ref 6.0–8.3)

## 2016-12-31 DIAGNOSIS — Z853 Personal history of malignant neoplasm of breast: Secondary | ICD-10-CM | POA: Diagnosis not present

## 2016-12-31 DIAGNOSIS — R928 Other abnormal and inconclusive findings on diagnostic imaging of breast: Secondary | ICD-10-CM | POA: Diagnosis not present

## 2017-01-07 MED FILL — ROSUVASTATIN CALCIUM 5 MG T: 5 | 70 days supply | Qty: 30 | Fill #2

## 2017-01-08 DIAGNOSIS — J301 Allergic rhinitis due to pollen: Secondary | ICD-10-CM | POA: Diagnosis not present

## 2017-01-08 DIAGNOSIS — L03031 Cellulitis of right toe: Secondary | ICD-10-CM | POA: Diagnosis not present

## 2017-01-08 DIAGNOSIS — J3089 Other allergic rhinitis: Secondary | ICD-10-CM | POA: Diagnosis not present

## 2017-01-10 DIAGNOSIS — J301 Allergic rhinitis due to pollen: Secondary | ICD-10-CM | POA: Diagnosis not present

## 2017-01-10 DIAGNOSIS — J3089 Other allergic rhinitis: Secondary | ICD-10-CM | POA: Diagnosis not present

## 2017-01-14 ENCOUNTER — Encounter: Payer: Self-pay | Admitting: Certified Nurse Midwife

## 2017-01-14 ENCOUNTER — Other Ambulatory Visit: Payer: Self-pay

## 2017-01-14 ENCOUNTER — Other Ambulatory Visit (HOSPITAL_COMMUNITY)
Admission: RE | Admit: 2017-01-14 | Discharge: 2017-01-14 | Disposition: A | Discharge status: Home / Self Care | Payer: Medicare Other | Source: Ambulatory Visit | Attending: Obstetrics & Gynecology | Admitting: Obstetrics & Gynecology

## 2017-01-14 ENCOUNTER — Ambulatory Visit (INDEPENDENT_AMBULATORY_CARE_PROVIDER_SITE_OTHER): Payer: Medicare Other | Admitting: Certified Nurse Midwife

## 2017-01-14 VITALS — BP 120/68 | HR 70 | Resp 16 | Ht <= 58 in | Wt 136.0 lb

## 2017-01-14 DIAGNOSIS — Z01419 Encounter for gynecological examination (general) (routine) without abnormal findings: Secondary | ICD-10-CM | POA: Diagnosis not present

## 2017-01-14 DIAGNOSIS — Z853 Personal history of malignant neoplasm of breast: Secondary | ICD-10-CM

## 2017-01-14 DIAGNOSIS — Z124 Encounter for screening for malignant neoplasm of cervix: Secondary | ICD-10-CM

## 2017-01-14 DIAGNOSIS — N952 Postmenopausal atrophic vaginitis: Secondary | ICD-10-CM

## 2017-01-14 NOTE — Patient Instructions (Signed)

## 2017-01-14 NOTE — Progress Notes (Signed)
81 y.o. [id number] Married  Caucasian Fe here for annual exam. Menopausal no HRT. Denies vaginal bleeding. Has vaginal dryness using coconut oil with good response. Continues to have night urination no more than 2 per night.. Occasional stress incontinence with vigorous cough or sneeze, no issues.[facility] PCP Dr. Lenna Gilford for cholesterol/hypertension. Sees Maryanna Shape for GERD and IBS management. All stable at present. Battling Lymphedema in left arm as result of breast cancer surgery. Sees Cardiology for follow up of previous stent placement. No other health issues today.  Patient's last menstrual period was 11/26/1990.          Sexually active: No.  The current method of family planning is post menopausal status.    Exercising: Yes.    walking Smoker:  no  Health Maintenance: Pap:  09-06-14 neg History of Abnormal Pap: no MMG:  11/18 Self Breast exams: no Colonoscopy:  2016 tubular adenoma, hyperplastic polyp no repeat due to age BMD:   2016 TDaP:  2011 Shingles: 2015 Pneumonia: 2016 Hep C and HIV: not done Labs: if needed.   reports that  has never smoked. she has never used smokeless tobacco. She reports that she does not drink alcohol or use drugs.  Past Medical History:  Diagnosis Date  . Asthma   . Atrophic vaginitis   . Breast cancer, Left 12/20/2010   NO BLOOD PRESSURE CHECKS OR STICKS IN LEFT ARM  . CAD (coronary artery disease)    a. 01/19/2010 s/p CABG x 3, [place]->lad, vg->diag, vg->om1;  b. 06/2011 :Lexi MV: EF 84%, No ischemia. c. 01/06/14 s/p negative nuclear stress test with EF >70%  . Cervical arthritis   . Colonic polyp 04-27-2009   tubular adenoma  . Diverticulosis of colon (without mention of hemorrhage)   . Fibromyalgia   . Gallstones   . GERD (gastroesophageal reflux disease)   . Glaucoma   . Headache(784.0)    a. frequently assocaited with high BPs  . Hiatal hernia   . Hypercholesterolemia   . Irritable bowel syndrome   . Labile hypertension     Past Surgical  History:  Procedure Laterality Date  . [place] STUDY N/A 01/08/2016   Procedure: [place] STUDY;  Surgeon: Ladene Artist, MD;  [facility];  Service: Endoscopy;  Laterality: N/A;  . BREAST LUMPECTOMY Left 02/06/11  . CATARACT EXTRACTION, BILATERAL     bilateral caaract removal,  . CORONARY ANGIOPLASTY WITH STENT PLACEMENT     Stent 2007  . CORONARY ARTERY BYPASS GRAFT    . ESOPHAGEAL MANOMETRY N/A 01/08/2016   Procedure: ESOPHAGEAL MANOMETRY (EM);  Surgeon: Ladene Artist, MD;  [facility];  Service: Endoscopy;  Laterality: N/A;  . open heart surgery     01/19/2010    Current Outpatient Medications  Medication Sig Dispense Refill  . Acetaminophen (TYLENOL 8 HOUR PO) Take by mouth as directed.     [name] albuterol (VENTOLIN HFA) 108 (90 Base) MCG/ACT inhaler Inhale 2 puffs into the lungs every 6 (six) hours as needed for wheezing or shortness of breath.    [name] amLODipine (NORVASC) 5 MG tablet Take 1 tablet (5 mg total) by mouth daily. 90 tablet 3  . aspirin EC 81 MG tablet Take 81 mg by mouth daily.    . Calcium & Magnesium Carbonates (MYLANTA PO) Take by mouth. 1-2 teaspoons as needed    . fexofenadine (ALLEGRA) 180 MG tablet Take 180 mg by mouth daily.      . fluocinonide ([place])  0.05 % external solution Apply 1 application topically daily as needed (scalp itching).     . Glucosamine-Chondroit-Vit C-Mn (GLUCOSAMINE CHONDR 1500 COMPLX PO) Take 1 tablet by mouth 3 (three) times daily.      . hydrALAZINE (APRESOLINE) 50 MG tablet take 1/2 tablet by mouth three times a day 45 tablet 9  . ketoconazole (NIZORAL) 2 % shampoo Apply 1 application topically 2 (two) times a week. As needed for itching a couple of times week    . latanoprost (XALATAN) 0.005 % ophthalmic solution Place 1 drop into both eyes at bedtime.     [name] losartan (COZAAR) 100 MG tablet take 1/2 tablet by mouth three times a day 45 tablet 5  . magnesium oxide (MAG-OX) 400 MG tablet Take 400 mg by mouth  daily. Reported on 02/14/2015    . metoprolol tartrate (LOPRESSOR) 50 MG tablet take 1 tablet by mouth three times a day 270 tablet 3  . mometasone (NASONEX) 50 MCG/ACT nasal spray Place 2 sprays into the nose daily as needed (congestion).     . Omega-3 Fatty Acids (FISH OIL) 1200 MG CAPS Take 1,200 mg by mouth daily. Reported on 02/14/2015    . pantoprazole (PROTONIX) 40 MG tablet Take 1 tablet (40 mg total) by mouth daily. 30 tablet 6  . Psyllium (METAMUCIL FIBER PO) Take 1 tablet by mouth daily.     . ranitidine (ZANTAC) 300 MG capsule Take 1 capsule (300 mg total) by mouth every evening. 30 capsule 3  . rosuvastatin (CRESTOR) 5 MG tablet Take 1 tablet (5 mg total) by mouth 3 (three) times a week. (take on Monday, Wednesday, and Friday) 30 tablet 11  . sucralfate (CARAFATE) 1 GM/10ML suspension Take 10 mLs (1 g total) by mouth 4 (four) times daily. 420 mL 11  . traMADol (ULTRAM) 50 MG tablet Take 1/2 to 1 tablet by mouth three times daily as needed 90 tablet 5  . vitamin B-12 (CYANOCOBALAMIN) 1000 MCG tablet Take 1,000 mcg by mouth daily.      . nitroGLYCERIN (NITROSTAT) 0.4 MG SL tablet Place 1 tablet (0.4 mg total) under the tongue every 5 (five) minutes as needed for chest pain. (Patient not taking: Reported on 01/14/2017) 25 tablet 2   No current facility-administered medications for this visit.     Family History  Problem Relation Age of Onset  . Stroke Mother   . Stroke Father   . Prostate cancer Father   . Breast cancer Sister   . Diabetes Sister   . Cancer Brother        gland cancer  . Heart disease Brother   . Colon cancer Neg Hx   . Stomach cancer Neg Hx   . Pancreatic cancer Neg Hx   . Kidney disease Neg Hx   . Liver disease Neg Hx     ROS:  Pertinent items are noted in HPI.  Otherwise, a comprehensive ROS was negative.  Exam:   BP 120/68   Pulse 70   Resp 16   Ht 4' 9.5" (1.461 m)   Wt 136 lb (61.7 kg)   LMP 11/26/1990   BMI 28.92 kg/m  Height: 4' 9.5"  (146.1 cm) Ht Readings from Last 3 Encounters:  01/14/17 4' 9.5" (1.461 m)  12/19/16 4' 10.4" (1.483 m)  11/14/16 4' 10.75" (1.492 m)    General appearance: alert, cooperative and appears stated age Head: Normocephalic, without obvious abnormality, atraumatic Neck: no adenopathy, supple, symmetrical, trachea midline and thyroid normal to  inspection and palpation Lungs: clear to auscultation bilaterally Breasts: normal appearance, no masses or tenderness, No nipple retraction or dimpling, No nipple discharge or bleeding, No axillary or supraclavicular adenopathy, scarring from radiation on left Heart: regular rate and rhythm Abdomen: soft, non-tender; no masses,  no organomegaly Extremities: extremities normal, atraumatic, no cyanosis or edema Skin: Skin color, texture, turgor normal. No rashes or lesions Lymph nodes: Cervical, supraclavicular, and axillary nodes normal. No abnormal inguinal nodes palpated Neurologic: Grossly normal   Pelvic: External genitalia:  no lesions              Urethra:  normal appearing urethra with no masses, tenderness or lesions              Bartholin's and Skene's: normal                 Vagina: normal appearing vagina with normal color and discharge, no lesions cystocele grade 1-2 noted (patient aware)              Cervix: no cervical motion tenderness, no lesions and stenotic os with 3 attempts for endocervical cells, normal appearance              Pap taken: Yes.   Bimanual Exam:  Uterus:  normal size, contour, position, consistency, mobility, non-tender              Adnexa: normal adnexa and no mass, fullness, tenderness               Rectovaginal: Confirms               Anus:  normal sphincter tone, no lesions  Chaperone present: yes  A:  Well Woman with normal exam  Menopausal no HRT  Atrophic vaginitis with coconut oil use with good response.  Cystocele grade 1-2 patient aware, no change  History of breast cancer with lymphedema occurring  now, using sleeve with good results  Hypertension,cholesterol, CAD,IBS and GERD with MD management  BMD due  P:   Reviewed health and wellness pertinent to exam  Aware need to advise if vaginal bleeding  Continue coconut oil use two times weekly  Will advise if urinary leakage increases  Continue MD follow up as indicated  Patient will schedule, declined our scheduling  Pap smear: yes  counseled on breast self exam, mammography screening, feminine hygiene, adequate intake of calcium and vitamin D, diet and exercise, Kegel's exercises return annually or prn  An After Visit Summary was printed and given to the patient.

## 2017-01-17 LAB — CYTOLOGY - PAP: DIAGNOSIS: NEGATIVE

## 2017-01-21 DIAGNOSIS — J3089 Other allergic rhinitis: Secondary | ICD-10-CM | POA: Diagnosis not present

## 2017-01-21 DIAGNOSIS — J301 Allergic rhinitis due to pollen: Secondary | ICD-10-CM | POA: Diagnosis not present

## 2017-01-23 DIAGNOSIS — J301 Allergic rhinitis due to pollen: Secondary | ICD-10-CM | POA: Diagnosis not present

## 2017-01-23 DIAGNOSIS — J3089 Other allergic rhinitis: Secondary | ICD-10-CM | POA: Diagnosis not present

## 2017-01-28 DIAGNOSIS — H40013 Open angle with borderline findings, low risk, bilateral: Secondary | ICD-10-CM | POA: Diagnosis not present

## 2017-01-28 DIAGNOSIS — H16223 Keratoconjunctivitis sicca, not specified as Sjogren's, bilateral: Secondary | ICD-10-CM | POA: Diagnosis not present

## 2017-01-30 DIAGNOSIS — J3089 Other allergic rhinitis: Secondary | ICD-10-CM | POA: Diagnosis not present

## 2017-01-30 DIAGNOSIS — J301 Allergic rhinitis due to pollen: Secondary | ICD-10-CM | POA: Diagnosis not present

## 2017-02-12 DIAGNOSIS — J3089 Other allergic rhinitis: Secondary | ICD-10-CM | POA: Diagnosis not present

## 2017-02-12 DIAGNOSIS — J301 Allergic rhinitis due to pollen: Secondary | ICD-10-CM | POA: Diagnosis not present

## 2017-03-05 DIAGNOSIS — J3089 Other allergic rhinitis: Secondary | ICD-10-CM | POA: Diagnosis not present

## 2017-03-05 DIAGNOSIS — J301 Allergic rhinitis due to pollen: Secondary | ICD-10-CM | POA: Diagnosis not present

## 2017-03-07 ENCOUNTER — Other Ambulatory Visit: Payer: Self-pay

## 2017-03-07 MED ORDER — PANTOPRAZOLE SODIUM 40 MG PO TBEC: 40.0000 mg | DELAYED_RELEASE_TABLET | Freq: Every day | ORAL | 0 refills | Status: DC

## 2017-03-18 MED FILL — AMLODIPINE BESYLATE 5 MG TA: 5 | 90 days supply | Qty: 90 | Fill #3

## 2017-03-20 DIAGNOSIS — J301 Allergic rhinitis due to pollen: Secondary | ICD-10-CM | POA: Diagnosis not present

## 2017-03-20 DIAGNOSIS — J3089 Other allergic rhinitis: Secondary | ICD-10-CM | POA: Diagnosis not present

## 2017-04-02 DIAGNOSIS — J3089 Other allergic rhinitis: Secondary | ICD-10-CM | POA: Diagnosis not present

## 2017-04-02 DIAGNOSIS — J301 Allergic rhinitis due to pollen: Secondary | ICD-10-CM | POA: Diagnosis not present

## 2017-04-08 MED FILL — ROSUVASTATIN CALCIUM 5 MG T: 5 | 70 days supply | Qty: 30 | Fill #3

## 2017-04-15 DIAGNOSIS — J3089 Other allergic rhinitis: Secondary | ICD-10-CM | POA: Diagnosis not present

## 2017-04-15 DIAGNOSIS — J301 Allergic rhinitis due to pollen: Secondary | ICD-10-CM | POA: Diagnosis not present

## 2017-04-16 DIAGNOSIS — M8589 Other specified disorders of bone density and structure, multiple sites: Secondary | ICD-10-CM | POA: Diagnosis not present

## 2017-04-21 ENCOUNTER — Other Ambulatory Visit: Payer: Self-pay | Admitting: Gastroenterology

## 2017-04-29 ENCOUNTER — Ambulatory Visit: Payer: Medicare Other | Admitting: Neurology

## 2017-04-29 DIAGNOSIS — J301 Allergic rhinitis due to pollen: Secondary | ICD-10-CM | POA: Diagnosis not present

## 2017-04-29 DIAGNOSIS — J3089 Other allergic rhinitis: Secondary | ICD-10-CM | POA: Diagnosis not present

## 2017-05-13 DIAGNOSIS — J301 Allergic rhinitis due to pollen: Secondary | ICD-10-CM | POA: Diagnosis not present

## 2017-05-13 DIAGNOSIS — J3089 Other allergic rhinitis: Secondary | ICD-10-CM | POA: Diagnosis not present

## 2017-05-13 DIAGNOSIS — K219 Gastro-esophageal reflux disease without esophagitis: Secondary | ICD-10-CM | POA: Diagnosis not present

## 2017-05-13 DIAGNOSIS — J4520 Mild intermittent asthma, uncomplicated: Secondary | ICD-10-CM | POA: Diagnosis not present

## 2017-05-19 ENCOUNTER — Encounter: Payer: Self-pay | Admitting: Neurology

## 2017-05-21 ENCOUNTER — Encounter: Payer: Self-pay | Admitting: Neurology

## 2017-05-21 ENCOUNTER — Encounter (INDEPENDENT_AMBULATORY_CARE_PROVIDER_SITE_OTHER): Payer: Self-pay

## 2017-05-21 ENCOUNTER — Ambulatory Visit (INDEPENDENT_AMBULATORY_CARE_PROVIDER_SITE_OTHER): Payer: Medicare Other | Admitting: Neurology

## 2017-05-21 VITALS — BP 130/73 | HR 56 | Ht <= 58 in | Wt 138.0 lb

## 2017-05-21 DIAGNOSIS — Z9989 Dependence on other enabling machines and devices: Secondary | ICD-10-CM

## 2017-05-21 DIAGNOSIS — G4733 Obstructive sleep apnea (adult) (pediatric): Secondary | ICD-10-CM | POA: Diagnosis not present

## 2017-05-21 NOTE — Patient Instructions (Signed)
Please continue using your CPAP regularly. While your insurance requires that you use CPAP at least 4 hours each night on 70% of the nights, I recommend, that you not skip any nights and use it throughout the night if you can. Getting used to CPAP and staying with the treatment long term does take time and patience and discipline. Untreated obstructive sleep apnea when it is moderate to severe can have an adverse impact on cardiovascular health and raise her risk for heart disease, arrhythmias, hypertension, congestive heart failure, stroke and diabetes. Untreated obstructive sleep apnea causes sleep disruption, nonrestorative sleep, and sleep deprivation. This can have an impact on your day to day functioning and cause daytime sleepiness and impairment of cognitive function, memory loss, mood disturbance, and problems focussing. Using CPAP regularly can improve these symptoms.  Keep up the good work! We can see you in 1 year, you can see one of our nurse practitioners as you are stable. I will see you after that.     

## 2017-05-21 NOTE — Progress Notes (Signed)
Subjective:    [name] ID: [name] is a 82 y.o. female.  HPI     Interim history:   [name] is a very pleasant 82 year old right-handed woman with an underlying medical history of fibromyalgia, coronary artery disease, status post CABG, irritable bowel syndrome, diverticulosis, breast cancer, hyperlipidemia, glaucoma, hypertension and overweight state, who presents for follow-up consultation of [name] obstructive sleep apnea, on treatment with CPAP, for [name] yearly check up. [name] is unaccompanied today. [name] last saw [name] on 04/25/2017, at which time [name] was compliant with CPAP therapy. [name] reported that [name] sometimes would forget to put [name] mask back on but generally [name] was doing okay. [name] was finishing [name] tamoxifen after being on it for 5 years. [name] had some weight loss.   Today, 05/21/2017: [name] reviewed [name] CPAP compliance data from 04/20/2017 through 05/19/2017 which is a total of 30 days, during which time [name] used [name] machine every night with percent used days greater than 4 hours at 100%, indicating superb compliance with an average usage of 7 hours and 30 minutes, residual AHI at goal at 0.6 per hour, leak from [name] very low side with [name] 95th percentile at 0.7 L/m on a pressure of 6 cm with EPR of 3. [name] reports doing well, some days [name] heart goes below 60 and [name] feels bad, checks [name] BP/P at home and skips [name] beta blocker for one dose. Tries to hydrate.    [name]'s allergies, current medications, family history, past medical history, past social history, past surgical history and problem list were reviewed and updated as appropriate.    Previously (copied from previous notes for reference):   [name] saw [name] on 04/26/2015, at which time [name] was compliant with CPAP therapy, [name] was doing fairly well. We talked about [name] sleep study results from 04/25/2014, [name] had trigger point injection under Dr. Tomi Likens, was referred to physical therapy and had 2 treatments with dry  needling. [name] suggested a new checkup from [name] sleep apnea standpoint. [name] had an appointment pending with Dr. Delice Lesch.    [name] reviewed [name] CPAP compliance data from 03/25/2016 through 04/23/2016, which is a total of 30 days, during which time [name] used [name] CPAP every night with percent used days greater than 4 hours at 93%, indicating excellent compliance with an average usage of 6 hours and 49 minutes, residual AHI low at 0.5 per hour, leak low for [name] 95th percentile at 1.4 L/m on a pressure of 6 cm with EPR.   [name] first met [name] on 10/27/2014 at [name] request of Dr. Krista Blue and Cecille Rubin, at which time [name] was advised regarding [name] split-night sleep study results from 04/25/2014. [name] had evidence of moderate obstructive sleep apnea. [name] did well with CPAP. [name] was compliant with CPAP therapy at [name] time and indicated improvement of [name] sleep including feeling better rested and having less daytime somnolence. Headaches were about [name] same and [name] was supposed to start seeing Dr. Delice Lesch for this.     [name] reviewed [name] CPAP compliance data from 03/26/2015 through 04/24/2015 which is a total of 30 days during which time [name] used [name] machine every night with percent used days greater than 4 hours at 100%, indicating superb compliance with an average usage of 7 hours and 1 minute, residual AHI low at 0.6 per hour, leak low with [name] 95th percentile at 0.9 L/m at a pressure of 6 cm with EPR.   10/27/2014: [name] was referred for  a sleep study. [name] reported morning headaches, nocturia, nonrestorative sleep and excessive daytime somnolence as well as snoring. [name] had a split-night sleep study on 04/25/2014 and [name] went over [name] test results with [name] in detail today. [name] baseline sleep efficiency was reduced at 53.5% with a latency to sleep of 42.5 minutes and wake after sleep onset of 61 minutes with moderate to severe sleep fragmentation noted. [name] had a markedly elevated arousal index. [name] had an increased percentage of  light stage sleep and absence of slow-wave sleep and REM sleep. [name] had PLMS at 62 per hour, with minimal arousals. [name] had mild to moderate snoring. Total AHI was 16.1 per hour. Average oxygen saturation was 95%, nadir was 85%. [name] was then titrated on CPAP. Sleep efficiency was 83.3%, latency to sleep 9 minutes and wake after sleep onset 16.5 minutes with mild sleep fragmentation noted. [name] had slow-wave sleep at 8.3% in rem sleep at 25.6%. Average oxygen saturation was 98%, nadir was 96%. Snoring was eliminated. [name] had a PLM index of 43.5 per hour with no arousals. [name] was titrated from 5 cm to 6 cm. AHI was 0 per hour on [name] final pressure with supine REM sleep achieved. Based on [name] test results are prescribed CPAP therapy for home use.   [name] reviewed [name] CPAP compliance data from 09/26/2014 through 10/25/2014 which is a total of 30 days during which time [name] used [name] machine every night with percent used days greater than 4 hours at 97%, indicating excellent compliance with an average usage of 7 hours and 4 minutes, residual AHI low at syrup 0.5 per hour, leak low with [name] 95th percentile at 3.8 L/m on a pressure of 6 cm.   [name] reports doing fairly well. Overall, [name] feels a little bit better rested when [name] uses CPAP and still is struggling somewhat with [name] mask. [name] uses nasal pillows. [name] started CPAP therapy in mid June. [name] had some questions about [name] machine and how to clean [name] house and so forth and [name] tried to answer them but [name] also asked [name] to get in touch with [name] DME company, [place]. As far as [name] bedtime goes, [name] tries to be in bed between 10 and 10:30. [name] rise time is 6 to 6:30 AM. [name] used to have restless leg symptoms even as a child. [name] husband has reported to [name] that [name] twitching has improved. [name] does not endorse bothersome restless leg symptoms at this time. [name] headaches are about [name] same. [name] nocturia is still about [name] same, 2-3 times in an average night. [name] does not drink  alcohol and does not drink caffeine on a regular basis and is a nonsmoker. [name] lives with [name] husband. [name] has 2 grown daughters and 4 granddaughters.  [name] Past Medical History Is Significant For: Past Medical History:  Diagnosis Date  . Asthma   . Atrophic vaginitis   . Breast cancer, Left 12/20/2010   NO BLOOD PRESSURE CHECKS OR STICKS IN LEFT ARM  . CAD (coronary artery disease)    a. 01/19/2010 s/p CABG x 3, lima->lad, vg->diag, vg->om1;  b. 06/2011 :Lexi MV: EF 84%, No ischemia. c. 01/06/14 s/p negative nuclear stress test with EF >70%  . Cervical arthritis   . Colonic polyp 04-27-2009   tubular adenoma  . Diverticulosis of colon (without mention of hemorrhage)   . Fibromyalgia   . Gallstones   . GERD (gastroesophageal reflux disease)   . Glaucoma   . Headache(784.0)  a. frequently assocaited with high BPs  . Hiatal hernia   . Hypercholesterolemia   . Irritable bowel syndrome   . Labile hypertension     [name] Past Surgical History Is Significant For: Past Surgical History:  Procedure Laterality Date  . [place] STUDY N/A 01/08/2016   Procedure: [place] STUDY;  Surgeon: Ladene Artist, MD;  Location: [facility];  Service: Endoscopy;  Laterality: N/A;  . BREAST LUMPECTOMY Left 02/06/11  . CATARACT EXTRACTION, BILATERAL     bilateral caaract removal,  . CORONARY ANGIOPLASTY WITH STENT PLACEMENT     Stent 2007  . CORONARY ARTERY BYPASS GRAFT    . ESOPHAGEAL MANOMETRY N/A 01/08/2016   Procedure: ESOPHAGEAL MANOMETRY (EM);  Surgeon: Ladene Artist, MD;  Location: [facility];  Service: Endoscopy;  Laterality: N/A;  . open heart surgery     01/19/2010    [name] Family History Is Significant For: Family History  Problem Relation Age of Onset  . Stroke Mother   . Stroke Father   . Prostate cancer Father   . Breast cancer Sister   . Diabetes Sister   . Cancer Brother        gland cancer  . Heart disease Brother   . Colon cancer Neg Hx   . Stomach cancer Neg Hx    . Pancreatic cancer Neg Hx   . Kidney disease Neg Hx   . Liver disease Neg Hx     [name] Social History Is Significant For: Social History   Socioeconomic History  . Marital status: Married    Spouse name: Gildardo Griffes. [place]  . Number of children: 2  . Years of education: 70  . Highest education level: Not on file  Occupational History  . Occupation: part time preschool teacher-retired    Employer: [employer]  . Financial resource strain: Not on file  . Food insecurity:    Worry: Not on file    Inability: Not on file  . Transportation needs:    Medical: Not on file    Non-medical: Not on file  Tobacco Use  . Smoking status: Never Smoker  . Smokeless tobacco: Never Used  Substance and Sexual Activity  . Alcohol use: No    Alcohol/week: 0.0 oz  . Drug use: No  . Sexual activity: Never    Partners: Male    Birth control/protection: Post-menopausal  Lifestyle  . Physical activity:    Days per week: Not on file    Minutes per session: Not on file  . Stress: Not on file  Relationships  . Social connections:    Talks on phone: Not on file    Gets together: Not on file    Attends religious service: Not on file    Active member of club or organization: Not on file    Attends meetings of clubs or organizations: Not on file    Relationship status: Not on file  Other Topics Concern  . Not on file  Social History Narrative   [name] lives at home with [name] husband Marcello Moores). [name] is retired. [name]  Has 12 th grade education.    Caffeine- sometimes- One cup of coffee.   Right handed.   Four granddaughters.    [name] Allergies Are:  Allergies  Allergen Reactions  . Levaquin [Levofloxacin In D5w]     Elevated BP  . Choline Fenofibrate Other (See Comments)     pt states INTOL to Trilipix w/ "thigh burning"  .  Hctz [Hydrochlorothiazide]     Causes hyponatremia  . Simvastatin Other (See Comments)     pt states INTOL to STATINS' \\T'$ \ refuses to  restart  . Adhesive [Tape] Rash  . Bentyl [Dicyclomine Hcl]     Made me feel weird and drained me  . Ceclor [Cefaclor] Rash  . Clarithromycin Rash  . Codeine Nausea Only  . Doxycycline Rash  . Hydrocodone     Nightmare after taking cough syrup w/hydrocodone  . Lisinopril Cough    Developed ACE cough...  . Penicillins Itching and Rash    At injection site  . Tobramycin-Dexamethasone Rash  :   [name] Current Medications Are:  Outpatient Encounter Medications as of 05/21/2017  Medication Sig  . Acetaminophen (TYLENOL 8 HOUR PO) Take by mouth as directed.   [name] albuterol (VENTOLIN HFA) 108 (90 Base) MCG/ACT inhaler Inhale 2 puffs into [name] lungs every 6 (six) hours as needed for wheezing or shortness of breath.  [name] amLODipine (NORVASC) 5 MG tablet Take 1 tablet (5 mg total) by mouth daily.  [name] aspirin EC 81 MG tablet Take 81 mg by mouth daily.  . Calcium & Magnesium Carbonates (MYLANTA PO) Take by mouth. 1-2 teaspoons as needed  . CARAFATE 1 GM/10ML suspension take 10 milliliters by mouth four times a day with meals and at bedtime  . fexofenadine (ALLEGRA) 180 MG tablet Take 180 mg by mouth daily.    . fluocinonide (LIDEX) 0.05 % external solution Apply 1 application topically daily as needed (scalp itching).   . Glucosamine-Chondroit-Vit C-Mn (GLUCOSAMINE CHONDR 1500 COMPLX PO) Take 1 tablet by mouth 3 (three) times daily.    . hydrALAZINE (APRESOLINE) 50 MG tablet take 1/2 tablet by mouth three times a day  . ketoconazole (NIZORAL) 2 % shampoo Apply 1 application topically 2 (two) times a week. As needed for itching a couple of times week  . latanoprost (XALATAN) 0.005 % ophthalmic solution Place 1 drop into both eyes at bedtime.   [name] losartan (COZAAR) 100 MG tablet take 1/2 tablet by mouth three times a day  . magnesium oxide (MAG-OX) 400 MG tablet Take 400 mg by mouth daily. Reported on 02/14/2015  . metoprolol tartrate (LOPRESSOR) 50 MG tablet take 1 tablet by mouth three times a day  .  mometasone (NASONEX) 50 MCG/ACT nasal spray Place 2 sprays into [name] nose daily as needed (congestion).   . nitroGLYCERIN (NITROSTAT) 0.4 MG SL tablet Place 1 tablet (0.4 mg total) under [name] tongue every 5 (five) minutes as needed for chest pain.  . Omega-3 Fatty Acids (FISH OIL) 1200 MG CAPS Take 1,200 mg by mouth daily. Reported on 02/14/2015  . pantoprazole (PROTONIX) 40 MG tablet Take 1 tablet (40 mg total) by mouth daily.  . Psyllium (METAMUCIL FIBER PO) Take 1 tablet by mouth daily.   . ranitidine (ZANTAC) 300 MG capsule Take 1 capsule (300 mg total) by mouth every evening.  . rosuvastatin (CRESTOR) 5 MG tablet Take 1 tablet (5 mg total) by mouth 3 (three) times a week. (take on Monday, Wednesday, and Friday)  . traMADol (ULTRAM) 50 MG tablet Take 1/2 to 1 tablet by mouth three times daily as needed  . vitamin B-12 (CYANOCOBALAMIN) 1000 MCG tablet Take 1,000 mcg by mouth daily.     No facility-administered encounter medications on file as of 05/21/2017.   : Review of Systems:  Out of a complete 14 point review of systems, all are reviewed and negative with [name] exception of these symptoms  as listed below: Review of Systems  Neurological:       Pt presents today to discuss [name] cpap. Pt's DME has changed to [place].   Objective:  Neurological Exam  Physical Exam Physical Examination:   Vitals:   05/21/17 1034  BP: 130/73  Pulse: (!) 56   General Examination: [name] is a very pleasant 82 y.o. female in no acute distress. [name] appears well-developed and well-nourished and well groomed.   HEENT: Normocephalic, atraumatic, pupils are equal, round and reactive to light and accommodation. Corrective eye glasses in place. Extraocular tracking is good without limitation to gaze excursion or nystagmus noted. Normal smooth pursuit is noted. Hearing is grossly intact. Face is symmetric with normal facial animation and normal facial sensation. Speech is clear with no dysarthria noted. There is  no hypophonia. There is no lip, neck/head, jaw or voice tremor. Neck is supple with full range of passive and active motion. Oropharynx exam reveals: mild mouth dryness, adequate dental hygiene and mild airway crowding. Mallampati is class II. Tongue protrudes centrally and palate elevates symmetrically.   Chest: Clear to auscultation without wheezing, rhonchi or crackles noted.  Heart: S1+S2+0, regular and normal without murmurs, rubs or gallops noted.   Abdomen: Soft, non-tender and non-distended with normal bowel sounds appreciated on auscultation.  Extremities: There is no pitting edema in [name] distal lower extremities bilaterally. Pedal pulses are intact.  Skin: Warm and dry without trophic changes noted.  Musculoskeletal: exam reveals no obvious joint deformities, tenderness or joint swelling or erythema.   Neurologically:  Mental status: [name] is awake, alert and oriented in all 4 spheres. [name] immediate and remote memory, attention, language skills and fund of knowledge are appropriate. There is no evidence of aphasia, agnosia, apraxia or anomia. Speech is clear with normal prosody and enunciation. Thought process is linear. Mood is normal and affect is normal.  Cranial nerves II - XII are as described above under HEENT exam. In addition: shoulder shrug is normal with equal shoulder height noted. Motor exam: Normal bulk, strength and tone is noted. There is no tremor. Fine motor skills and coordination: grossly intact.  Cerebellar testing: No dysmetria or intention tremor. No gait ataxia.  Sensory exam: intact to light touch in [name] upper and lower extremities.  Gait, station and balance: [name] stands easily. No veering to one side is noted. No leaning to one side is noted. Posture is age-appropriate and stance is narrow based. Gait shows normal stride length and normal pace.   Assessment and Plan:  In summary, AEVA POSEY is a very pleasant 82 year old female with an  underlying medical history of coronary artery disease with status post CABG, irritable bowel syndrome, reflux disease, diverticulosis, history of breast cancer, fibromyalgia, hyperlipidemia, neck pain, glaucoma, hypertension, recurrent headaches and overweight state, who presents for follow-up consultation of [name] OSA, well established on CPAP therapy since mid 2016 with full compliance and ongoing good results. [name] is doing well, exam is stable. [name] is commended for [name] excellent treatment adherence with CPAP. [name] noted benefit when [name] started treatment in [name] sense that [name] felt more rested and less tired during [name] day. [name] average usage of 7-1/2 hours is great as well. [name] placed an order for updated CPAP supplies which we will fax to [name] new DME company. From my end of things [name] is doing rather well. [name] suggested a yearly checkup for sleep apnea. [name] can see one of our nurse practitioners next time. [name] answered  all [name] questions today and [name] was in agreement. [name] is encouraged to call for any interim problems with [name] CPAP machine or sleep apnea. [name] spent 25 minutes in total face-to-face time with [name], more than 50% of which was spent in counseling and coordination of care, reviewing test results, reviewing medication and discussing or reviewing [name] diagnosis of OSA, its prognosis and treatment options. Pertinent laboratory and imaging test results that were available during this visit with [name] were reviewed by me and considered in my medical decision making (see chart for details).

## 2017-06-03 ENCOUNTER — Encounter: Payer: Self-pay | Admitting: Neurology

## 2017-06-03 ENCOUNTER — Ambulatory Visit (INDEPENDENT_AMBULATORY_CARE_PROVIDER_SITE_OTHER): Payer: Medicare Other | Admitting: Neurology

## 2017-06-03 VITALS — BP 144/72 | HR 60 | Ht <= 58 in | Wt 137.0 lb

## 2017-06-03 DIAGNOSIS — R51 Headache: Secondary | ICD-10-CM

## 2017-06-03 DIAGNOSIS — J301 Allergic rhinitis due to pollen: Secondary | ICD-10-CM | POA: Diagnosis not present

## 2017-06-03 DIAGNOSIS — J3089 Other allergic rhinitis: Secondary | ICD-10-CM | POA: Diagnosis not present

## 2017-06-03 DIAGNOSIS — R200 Anesthesia of skin: Secondary | ICD-10-CM | POA: Diagnosis not present

## 2017-06-03 DIAGNOSIS — G4486 Cervicogenic headache: Secondary | ICD-10-CM

## 2017-06-03 NOTE — Progress Notes (Signed)
NEUROLOGY FOLLOW UP OFFICE NOTE  [name] [id number]  DOB: [birth year]  HISTORY OF PRESENT ILLNESS: I had the pleasure of seeing [name] in follow-up in the neurology clinic on 06/03/2017.  The patient was last seen a year ago for headaches and neck pain. She had previously been having chronic daily headaches likely cervicogenic. She had occipital nerve blocks in 2017 with not much improvement. She had good response to physical therapy and had dry needling twice. She had good response to the first dry needling, but states she did not get much response after the second course. She continues to do her home exercises. She reports that headaches are still not as bad as before, but she does have an ache in the back of her head sometimes. She has on and off neck pain. She gets lightheaded a lot, no falls. She feels unsteady sometimes standing up. She reports more numbness in her right hand now, it goes to sleep, mostly on the 2nd and 3rd digits. Occasionally if she moves her hand a certain way, she feels an electrical shock down her fingers. She also has the same sensation in her right foot up her knee. Her whole leg aches sometimes. Her back aches if she sits for prolonged periods, her legs won't be still. No bowel/bladder dysfunction. Left side is mostly okay.   HPI: This is a pleasant 82 yo RH woman with a history of hypertension, hyperlipidemia, CAD, breast cancer s/p lumpectomy, chemotherapy and radiation, with daily headaches. She had been seeing neurologist Dr. Krista Blue since 2014. Records were reviewed. [name] reports a history of migraines since her 65s. These quieted down, until 2014 when she started having headaches that she reports were different from her migraines in the past. It starts in the bilateral occipital region, her neck feels strange, "like it's about to break in the middle," then radiates to the frontal region like a tight band of pressure. This is associated with  nausea. Her last migraine was 1 year ago. She was reporting 15 headaches days a month. MRI cervical spine had shown multilevel degenerative disc disease, mild right foraminal stenosis at C3-4 secondary to asymmetric right-sided facet hypertrophy and uncovertebral disease, facet hypertrophy and disc osteophyte complexes at C4-5 with mild central canal narrowing, mild to moderate left central canal stenosis C5-6 without significant foraminal disease, mild central and left foraminal stenosis at C6-7 secondary to a broad-based disc osteophyte complex and uncovertebral disease, mild left foraminal narrowing at C7-C1 secondary to facet disease. ESR normal. In 2015, she was admitted for hyponatremia and headaches. Headaches had increased in frequency, described as a burning and pressure sensation. Ultram would help some. She was noted to have elevated blood pressure during times of headaches. According to notes, on her last visit in August 2016, she had reported improvement in the headaches but still frequent, and was offered the option of an occipital nerve block.   She reports having the headaches constantly, waxing and waning in intensity from 3/10 to 10/10. She takes Tramadol or Arthritis Tylenol, applying ice helps. She chews ginger for the nausea. In the past, seeing her chiropractor used to help, but not anymore. She had PT for neck pain in 2015 and was told her neck was so tight. She feels slightly better lying down. She has had balance problems but denies any recent falls in the past year. She reports that her BP is overall better except this week when she has had more intense headaches. She started  using a CPAP machine last June but has not noticed much change and still does not feel rested on awakening.   PAST MEDICAL HISTORY: Past Medical History:  Diagnosis Date  . Asthma   . Atrophic vaginitis   . Breast cancer, Left 12/20/2010   NO BLOOD PRESSURE CHECKS OR STICKS IN LEFT ARM  . CAD (coronary  artery disease)    a. 01/19/2010 s/p CABG x 3, lima->lad, vg->diag, vg->om1;  b. 06/2011 :Lexi MV: EF 84%, No ischemia. c. 01/06/14 s/p negative nuclear stress test with EF >70%  . Cervical arthritis   . Colonic polyp 04-27-2009   tubular adenoma  . Diverticulosis of colon (without mention of hemorrhage)   . Fibromyalgia   . Gallstones   . GERD (gastroesophageal reflux disease)   . Glaucoma   . Headache(784.0)    a. frequently assocaited with high BPs  . Hiatal hernia   . Hypercholesterolemia   . Irritable bowel syndrome   . Labile hypertension     MEDICATIONS: Current Outpatient Medications on File Prior to Visit  Medication Sig Dispense Refill  . Acetaminophen (TYLENOL 8 HOUR PO) Take by mouth as directed.     [name] albuterol (VENTOLIN HFA) 108 (90 Base) MCG/ACT inhaler Inhale 2 puffs into the lungs every 6 (six) hours as needed for wheezing or shortness of breath.    [name] amLODipine (NORVASC) 5 MG tablet Take 1 tablet (5 mg total) by mouth daily. 90 tablet 3  . aspirin EC 81 MG tablet Take 81 mg by mouth daily.    . Calcium & Magnesium Carbonates (MYLANTA PO) Take by mouth. 1-2 teaspoons as needed    . CARAFATE 1 GM/10ML suspension take 10 milliliters by mouth four times a day with meals and at bedtime 420 mL 2  . fexofenadine (ALLEGRA) 180 MG tablet Take 180 mg by mouth daily.      . fluocinonide (LIDEX) 0.05 % external solution Apply 1 application topically daily as needed (scalp itching).     . Glucosamine-Chondroit-Vit C-Mn (GLUCOSAMINE CHONDR 1500 COMPLX PO) Take 1 tablet by mouth 3 (three) times daily.      . hydrALAZINE (APRESOLINE) 50 MG tablet take 1/2 tablet by mouth three times a day 45 tablet 9  . ketoconazole (NIZORAL) 2 % shampoo Apply 1 application topically 2 (two) times a week. As needed for itching a couple of times week    . latanoprost (XALATAN) 0.005 % ophthalmic solution Place 1 drop into both eyes at bedtime.     [name] losartan (COZAAR) 100 MG tablet take 1/2 tablet by  mouth three times a day 45 tablet 5  . magnesium oxide (MAG-OX) 400 MG tablet Take 400 mg by mouth daily. Reported on 02/14/2015    . metoprolol tartrate (LOPRESSOR) 50 MG tablet take 1 tablet by mouth three times a day 270 tablet 3  . mometasone (NASONEX) 50 MCG/ACT nasal spray Place 2 sprays into the nose daily as needed (congestion).     . nitroGLYCERIN (NITROSTAT) 0.4 MG SL tablet Place 1 tablet (0.4 mg total) under the tongue every 5 (five) minutes as needed for chest pain. 25 tablet 2  . Omega-3 Fatty Acids (FISH OIL) 1200 MG CAPS Take 1,200 mg by mouth daily. Reported on 02/14/2015    . pantoprazole (PROTONIX) 40 MG tablet Take 1 tablet (40 mg total) by mouth daily. 30 tablet 0  . Psyllium (METAMUCIL FIBER PO) Take 1 tablet by mouth daily.     . ranitidine (ZANTAC) 300 MG  capsule Take 1 capsule (300 mg total) by mouth every evening. 30 capsule 3  . rosuvastatin (CRESTOR) 5 MG tablet Take 1 tablet (5 mg total) by mouth 3 (three) times a week. (take on Monday, Wednesday, and Friday) 30 tablet 11  . traMADol (ULTRAM) 50 MG tablet Take 1/2 to 1 tablet by mouth three times daily as needed 90 tablet 5  . vitamin B-12 (CYANOCOBALAMIN) 1000 MCG tablet Take 1,000 mcg by mouth daily.       No current facility-administered medications on file prior to visit.     ALLERGIES: Allergies  Allergen Reactions  . Levaquin [Levofloxacin In D5w]     Elevated BP  . Choline Fenofibrate Other (See Comments)     pt states INTOL to Trilipix w/ "thigh burning"  . Hctz [Hydrochlorothiazide]     Causes hyponatremia  . Simvastatin Other (See Comments)     pt states INTOL to STATINS \T\ refuses to restart  . Adhesive [Tape] Rash  . Bentyl [Dicyclomine Hcl]     Made me feel weird and drained me  . Ceclor [Cefaclor] Rash  . Clarithromycin Rash  . Codeine Nausea Only  . Doxycycline Rash  . Hydrocodone     Nightmare after taking cough syrup w/hydrocodone  . Lisinopril Cough    Developed ACE cough...  .  Penicillins Itching and Rash    At injection site  . Tobramycin-Dexamethasone Rash    FAMILY HISTORY: Family History  Problem Relation Age of Onset  . Stroke Mother   . Stroke Father   . Prostate cancer Father   . Breast cancer Sister   . Diabetes Sister   . Cancer Brother        gland cancer  . Heart disease Brother   . Colon cancer Neg Hx   . Stomach cancer Neg Hx   . Pancreatic cancer Neg Hx   . Kidney disease Neg Hx   . Liver disease Neg Hx     SOCIAL HISTORY: Social History   Socioeconomic History  . Marital status: Married    Spouse name: Gildardo Griffes. Novicki  . Number of children: 2  . Years of education: 44  . Highest education level: Not on file  Occupational History  . Occupation: part time preschool teacher-retired    Employer: [employer]  . Financial resource strain: Not on file  . Food insecurity:    Worry: Not on file    Inability: Not on file  . Transportation needs:    Medical: Not on file    Non-medical: Not on file  Tobacco Use  . Smoking status: Never Smoker  . Smokeless tobacco: Never Used  Substance and Sexual Activity  . Alcohol use: No    Alcohol/week: 0.0 oz  . Drug use: No  . Sexual activity: Never    Partners: Male    Birth control/protection: Post-menopausal  Lifestyle  . Physical activity:    Days per week: Not on file    Minutes per session: Not on file  . Stress: Not on file  Relationships  . Social connections:    Talks on phone: Not on file    Gets together: Not on file    Attends religious service: Not on file    Active member of club or organization: Not on file    Attends meetings of clubs or organizations: Not on file    Relationship status: Not on file  . Intimate partner violence:    Fear of  current or ex partner: Not on file    Emotionally abused: Not on file    Physically abused: Not on file    Forced sexual activity: Not on file  Other Topics Concern  . Not on file  Social History  Narrative   Patient lives at home with her husband Marcello Moores). Patient is retired. Patient  Has 12 th grade education.    Caffeine- sometimes- One cup of coffee.   Right handed.   Four granddaughters.    REVIEW OF SYSTEMS: Constitutional: No fevers, chills, or sweats, no generalized fatigue, change in appetite Eyes: No visual changes, double vision, eye pain Ear, nose and throat: No hearing loss, ear pain, nasal congestion, sore throat Cardiovascular: No chest pain, palpitations Respiratory:  No shortness of breath at rest or with exertion, wheezes GastrointestinaI: No nausea, vomiting, diarrhea, abdominal pain, fecal incontinence Genitourinary:  No dysuria, urinary retention or frequency Musculoskeletal:  + neck pain, back pain Integumentary: No rash, pruritus, skin lesions Neurological: as above Psychiatric: No depression, insomnia, anxiety Endocrine: No palpitations, fatigue, diaphoresis, mood swings, change in appetite, change in weight, increased thirst Hematologic/Lymphatic:  No anemia, purpura, petechiae. Allergic/Immunologic: no itchy/runny eyes, nasal congestion, recent allergic reactions, rashes  PHYSICAL EXAM: Vitals:   06/03/17 1014  BP: (!) 144/72  Pulse: 60  SpO2: 99%   General: No acute distress Head:  Normocephalic/atraumatic, no occipital tenderness Neck: supple, + paraspinal tenderness, full range of motion Heart:  Regular rate and rhythm Lungs:  Clear to auscultation bilaterally Back: No paraspinal tenderness Skin/Extremities: No rash, no edema Neurological Exam: alert and oriented to person, place, and time. No aphasia or dysarthria. Fund of knowledge is appropriate.  Recent and remote memory are intact.  Attention and concentration are normal.    Able to name objects and repeat phrases. Cranial nerves: Pupils equal, round, reactive to light.  Extraocular movements intact with no nystagmus. Visual fields full. Facial sensation intact. No facial asymmetry.  Tongue, uvula, palate midline.  Motor: Bulk and tone normal, muscle strength 5/5 throughout except for 4/5 bilateral APB, no pronator drift.  Sensation to light touch intact, cold, pin. Decreased vibration to ankles bilaterally.  No extinction to double simultaneous stimulation.  Deep tendon reflexes +2 throughout, toes downgoing.  Finger to nose testing intact.  Gait narrow-based and steady, mild difficulty with tandem walk.  Romberg negative. Negative Tinel and Phalen signs.  IMPRESSION: This is a pleasant 82 yo RH woman with a history of hypertension, sleep apnea, migraines, who was having chronic daily headaches over the occipital and neck regions. She did not have much response to occipital nerve blocks but had good response to PT and dry needling for neck pain, suggestive of cervicogenic headaches. Headaches and neck pain are overall better, she continues to do home PT exercises. Her main concern today is the numbness in her right hand and foot. There is mild thumb weakness, mild neuropathy. We discussed doing an EMG/NCV of the right UE and LE to further evaluate her symptoms, she would like to hold off for now and knows to call our office if symptoms worsen. She declines starting gabapentin or Lyrica. She will follow-up in 1 year and knows to call for any changes.   Thank you for allowing me to participate in her care.  Please do not hesitate to call for any questions or concerns.  The duration of this appointment visit was 25 minutes of face-to-face time with the patient.  Greater than 50% of this time was spent  in counseling, explanation of diagnosis, planning of further management, and coordination of care.   Ellouise Newer, M.D.   CC: Dr. Lenna Gilford

## 2017-06-03 NOTE — Patient Instructions (Signed)
Great seeing you! If the problems on the right hand and foot worsen, would proceed with nerve testing. Call our office when ready. Follow-up in 1 year.

## 2017-06-10 DIAGNOSIS — G43909 Migraine, unspecified, not intractable, without status migrainosus: Secondary | ICD-10-CM | POA: Diagnosis not present

## 2017-06-19 ENCOUNTER — Ambulatory Visit (INDEPENDENT_AMBULATORY_CARE_PROVIDER_SITE_OTHER)
Admission: RE | Admit: 2017-06-19 | Discharge: 2017-06-19 | Disposition: A | Discharge status: Home / Self Care | Payer: Medicare Other | Source: Ambulatory Visit | Attending: Pulmonary Disease | Admitting: Pulmonary Disease

## 2017-06-19 ENCOUNTER — Ambulatory Visit (INDEPENDENT_AMBULATORY_CARE_PROVIDER_SITE_OTHER): Payer: Medicare Other | Admitting: Pulmonary Disease

## 2017-06-19 ENCOUNTER — Encounter: Payer: Self-pay | Admitting: Pulmonary Disease

## 2017-06-19 ENCOUNTER — Other Ambulatory Visit (INDEPENDENT_AMBULATORY_CARE_PROVIDER_SITE_OTHER): Payer: Medicare Other

## 2017-06-19 VITALS — BP 132/62 | HR 61 | Temp 97.7°F | Ht <= 58 in | Wt 136.8 lb

## 2017-06-19 DIAGNOSIS — K2100 Gastro-esophageal reflux disease with esophagitis, without bleeding: Secondary | ICD-10-CM

## 2017-06-19 DIAGNOSIS — Z9989 Dependence on other enabling machines and devices: Secondary | ICD-10-CM

## 2017-06-19 DIAGNOSIS — I517 Cardiomegaly: Secondary | ICD-10-CM | POA: Diagnosis not present

## 2017-06-19 DIAGNOSIS — J4520 Mild intermittent asthma, uncomplicated: Secondary | ICD-10-CM

## 2017-06-19 DIAGNOSIS — C50512 Malignant neoplasm of lower-outer quadrant of left female breast: Secondary | ICD-10-CM

## 2017-06-19 DIAGNOSIS — J3089 Other allergic rhinitis: Secondary | ICD-10-CM | POA: Diagnosis not present

## 2017-06-19 DIAGNOSIS — I2510 Atherosclerotic heart disease of native coronary artery without angina pectoris: Secondary | ICD-10-CM

## 2017-06-19 DIAGNOSIS — Z170 Estrogen receptor positive status [ER+]: Secondary | ICD-10-CM

## 2017-06-19 DIAGNOSIS — F411 Generalized anxiety disorder: Secondary | ICD-10-CM

## 2017-06-19 DIAGNOSIS — J301 Allergic rhinitis due to pollen: Secondary | ICD-10-CM | POA: Diagnosis not present

## 2017-06-19 DIAGNOSIS — R0989 Other specified symptoms and signs involving the circulatory and respiratory systems: Secondary | ICD-10-CM | POA: Diagnosis not present

## 2017-06-19 DIAGNOSIS — E782 Mixed hyperlipidemia: Secondary | ICD-10-CM

## 2017-06-19 DIAGNOSIS — G4733 Obstructive sleep apnea (adult) (pediatric): Secondary | ICD-10-CM

## 2017-06-19 DIAGNOSIS — Z951 Presence of aortocoronary bypass graft: Secondary | ICD-10-CM

## 2017-06-19 DIAGNOSIS — K5730 Diverticulosis of large intestine without perforation or abscess without bleeding: Secondary | ICD-10-CM

## 2017-06-19 DIAGNOSIS — K589 Irritable bowel syndrome without diarrhea: Secondary | ICD-10-CM

## 2017-06-19 LAB — COMPREHENSIVE METABOLIC PANEL
ALT: 16 U/L (ref 0–35)
AST: 17 U/L (ref 0–37)
Albumin: 4.4 g/dL (ref 3.5–5.2)
Alkaline Phosphatase: 70 U/L (ref 39–117)
BUN: 12 mg/dL (ref 6–23)
CO2: 30 meq/L (ref 19–32)
Calcium: 9.6 mg/dL (ref 8.4–10.5)
Chloride: 99 mEq/L (ref 96–112)
Creatinine, Ser: 0.66 mg/dL (ref 0.40–1.20)
GFR: 91.64 mL/min (ref >60.00)
GLUCOSE: 94 mg/dL (ref 70–99)
POTASSIUM: 4.7 meq/L (ref 3.5–5.1)
SODIUM: 136 meq/L (ref 135–145)
Total Bilirubin: 1.2 mg/dL (ref 0.2–1.2)
Total Protein: 7.4 g/dL (ref 6.0–8.3)

## 2017-06-19 LAB — LIPID PANEL
CHOL/HDL RATIO: 4
Cholesterol: 193 mg/dL (ref 0–200)
HDL: 54.8 mg/dL (ref >39.00)
LDL Cholesterol: 100 mg/dL — ABNORMAL HIGH (ref 0–99)
NONHDL: 138.29
Triglycerides: 192 mg/dL — ABNORMAL HIGH (ref 0.0–149.0)
VLDL: 38.4 mg/dL (ref 0.0–40.0)

## 2017-06-19 LAB — CBC WITH DIFFERENTIAL/PLATELET
BASOS PCT: 0.8 % (ref 0.0–3.0)
Basophils Absolute: 0.1 10*3/uL (ref 0.0–0.1)
EOS PCT: 4.5 % (ref 0.0–5.0)
Eosinophils Absolute: 0.3 10*3/uL (ref 0.0–0.7)
HCT: 40.3 % (ref 36.0–46.0)
Hemoglobin: 13.5 g/dL (ref 12.0–15.0)
LYMPHS ABS: 3 10*3/uL (ref 0.7–4.0)
Lymphocytes Relative: 48.2 % — ABNORMAL HIGH (ref 12.0–46.0)
MCHC: 33.6 g/dL (ref 30.0–36.0)
MCV: 91.7 fl (ref 78.0–100.0)
MONOS PCT: 10.4 % (ref 3.0–12.0)
Monocytes Absolute: 0.7 10*3/uL (ref 0.1–1.0)
NEUTROS ABS: 2.3 10*3/uL (ref 1.4–7.7)
NEUTROS PCT: 36.1 % — AB (ref 43.0–77.0)
PLATELETS: 269 10*3/uL (ref 150.0–400.0)
RBC: 4.4 Mil/uL (ref 3.87–5.11)
RDW: 14.4 % (ref 11.5–15.5)
WBC: 6.3 10*3/uL (ref 4.0–10.5)

## 2017-06-19 LAB — TSH: TSH: 1.26 u[IU]/mL (ref 0.35–4.50)

## 2017-06-19 NOTE — Progress Notes (Signed)
Subjective:    Patient ID: [name], female    DOB: 18-Apr-1935, 82 y.o.   MRN: [id number]  HPI 82 y/o WF here for a follow up visit... she has multiple medical problems including AR & Asthma;  HBP;  CAD followed by [name] & s/p 3 vessel CABG 11/11 by DrGearhardt;  Hyperlipidemia followed in the [facility];  GERD/ IBS/ Divertics;  Breast Cancer diagnosed 12/12;  Fibromyalgia;  Chr HAs & dizziness...   ~  SEE PREV EPIC NOTES FOR OLDER DATA >>   ~   CP 11/11=> cath w/ in-stent restenosis, then CABG x3 by DrGearhardt; re-hospitalized 6/12 by Cards for CP> cath showed severe 3 vessel CAD w/ 3/3 grafts patent & norm LVF w/ EF= 65-70%; Myoview 5/13 showed no ischemia & EF>80%; no change in meds- on Imdur30; & DrNishan noted that med rx is lim by side effects.  EKG 9/13 showed NSR, rate64, NSSTTWA, NAD...   CXR 10/13 showed heart at upper lim of norm, prior CABG, mild hyperinflation, clear, NAD...  Abd Ultrasound 10/13 by [name] for eval CP showed> norm GB, liver, kidneys, & Ao- neg sonar...  LABS 10-12/13 showed Chems- wnl;  CBC- wnl  LABS 2/14:  FLP- ok x LDL=110;  TSH=1.33   CXR 4/14 showed norm heart size, s/p CABG, tort thor Ao, bilat apical pleural scarring, NAD...  CXR 10/15 showed normal heart size, s/p CABG, unchanged biapical pleural scarring, otherw clear lungs w/o infiltrates, NAD...  ~   [name] was [facility] 10/29 - 12/27/13 by [facility] w/ HA, HBP, & hyponatremia (Na=119 ?etiology)> she had recently been treated for bronchitic exac w/ Levaquin & Mucinex, she thinks the HBP was a reaction "allergy" to Levaquin & doesn't want to take this antibiotic again; BP was 220 range in ER &treated w/ Cardene drip=> she was actually disch on less meds than PTA=> she returned to ER the day after disch w/ HBP & BP ~200 & she hadn't taken her Losartan yet, given IV hyralazine & BP improved to the 160s; now on Metop50Bid & Cozaar50 (prev on Hyzaar100-12.5); they suspected her diuretic in her hyponatremia which  corrected w/ IVF; she has a hx of HAs from DJD/neck and FM...  CXR 10/15 showed fe chr changes in lungs, norm heart size & s/p CABG, DJD in spine, NAD.Marland KitchenMarland Kitchen  EKG 11/15 showed NSR, rate69, NSSTTWA, NAD... ~  12/2013=> another hospitalization in the interval> [name] has had a continuing saga of HA & HBP, she has been to the ER, to Cardiology, Hospitalized, back to Cardiology, and almost daily phone calls for HA & HBP w/ BP ~200 sys> all notes reviewed;  Cards eval for secondary cause of the HBP has been neg;  Initially it was assumed that incr HA pain resulted in HBP but she has a hx of chronic daily HAs and she continues to have HAs when her BP is controlled;  She was last Hosp 11/11 - 01/06/14 and disch on ASA81, Imdur60, Hydralazine50Tid, Losar100-1/2Bid, Metop50Bid;  BP at disch was 100/47;  She saw [name] 11/16 on these meds w/ BP recorded 130/64, Imdur was stopped due to her HAs;  She has called daily since then w/ elev BP (esp at night she says) and we have rec strategies using her current meds to help her headaches and adjust BP meds:  CXR 11/15 showed norm heart size, prev CABG, clear lungs- some hyperinflation/ NAD, DJD Tspine...  Renal Sonar 11/15 was wnl...  CT Head 11/15 showed cortical atrophy and small vessel  dis, sinuses clear, NAD.Marland KitchenMarland Kitchen   EKG 11/15 showed NSR, rate64, borderline tracing w/?LAE & min NSSTTWA...  2DEcho 11/15 showed norm cavity size & thickness, norm EF= 60-65%, norm wall motion, norm diastolic function, essentially norm valves...   Myoview 11/15 showed low risk scan w/ abn EKG portion but no stress induced perfusion defects, hyperdynamic LVF w/ EF>70% & no regional wall motion abn  LABS 11/15:  Chems- wnl w/ Cr=0.6;  CBC- ok w/ Hg= 12.3;  TSH=1.60 ~  01/2014=> She is c/o a jitteriness/ shaking on the inside feeling and we discussed trying a regular medication to try to help elim this sensation> try KLONOPIN 0.3m- 1/2 to 1 tab Bid (she never filled the Klonopin and refuses  anxiolytic meds).  ~  April 13, 2014:  260moOV & [name] had numerous medical follow up visits over the last 48m63mo  She saw GI 12/15 w/ lower abd pain & nausea similar to prev bouts of diverticulitis in past;  CT Abd&Pelvis showed mild sigmoid diverticulitis, no evid of abscess or complications;  She was treated w/ Cipro & Flagyl & improved;  She is maintained on Prilosec, Anaspaz, fiber & metamucil...     She saw Neuro, [name] 1/16 due to headaches> long hx migraines, recurrent prob since 2014, now 15/30 days per month, CT Brain was neg x atrophy & sm vessel dis, MRI Cspine w/ multilevel DDD etc; treated w/ Tramadol, Flexeril, Zanaflex and [name] has ordered a Sleep Study- still pending & sched at end of this month...     She also saw DrNishan for CARDS f/u 1/16> hx CAD, CABG 2011, HBP, HLD; Adm 10/15 w/ HBP & hyponatremia (Na=119), HCTZ was stopped, BP meds adjusted & now controlled on Metoprolol50Tid, Losar100-1/2Tid, Apres50-1/2Tid; BP today= 136/80, improved & she denies recent CP, palpit, dizzy, ch in SOB, edema;  2DEcho 11/15 showed norm LVF w/ EF=60-65% & no regional wall motion abn, trivial AI, otherw wnl; Lexiscan Myoview 11/15 showed low risk scan w/ abn EKG portion but no ischemic perfusion defects & hyperdynamic LV w/ EF=70% & no wall motion abn... We reviewed prob list, meds, xrays and labs> see below for updates >> we gave her the PREVNAR-13 today... [facility] she will continue her same meds and plan f/u in ~52mo8mofasting blood work at that time...   ~  May 03, 2014:  3wk ROV & pt called requesting urgent add-on appt for recurrent BP problems> Pt notes that BP has been well controlled on her regimen of low sodium, Metoprolol50Tid, Hydralazine50-1/2Tid, and Losartan100-1/2Tid> until 3d ago when she had the onset of another severe HA- assoc w/ the severe pain she noted that her BP was once again elevated (190-230/ 100-110 she says) & the usual  doses of the 3 meds didn't bring it down; she tried taking the Tramadol50 as directed by [name] & added Tylenol plus her musc relaxers Flexeril & Zanaflex but the HA persisted; when she called [name]'s office she was directed to call us f[place] an appt because of the BP... NOTE> it has been determined by her Cardiologist DrNishan & I concur that in her case it's the HA PAIN that causes her BP to spike, and relief of the pain is paramount to controlling the BP in these instances...  In this regard she is directed to maintain a baseline BP regimen w/ Metoprolol50Tid, Apres50-1/2Tid & Losar100-1/2Tid;  But when she has a HA she will immediately start her  Neurology action plan for her severe headaches, and check her BP Tid w/ adjustment of her meds up to one of each tab Tid until the HA is resolved, then return to her baseline regimen... We reviewed this plan in detail & she will let us know if she has any questions...   CXR 3/16 showed norm heart size, s/p CABG, clear lungs, NAD.Marland KitchenMarland Kitchen   EKG 3/16 showed SBrady, rate53, NSSTTWA, NAD... PLAN>> continue baseline BP med regimen w/ METOPROLOL50Tid, APRESOLINE50-1/2Tid & LOSARTAN100-1/2Tid; continue to monitor BP at home; during episodes of severe HA pain- follow Neurologists HA action plan for control of the discomfort; in addition start to monitor BP 3 times daily & increase BP meds to 1 of each Tid until the HA is controlled & the BP returns to normal; then resume the baseline regimen...  ~  August 16, 2014:  3-337moROV & [name] Dinhas continued w/ her regular array of medical specialist follow up visits> SEE BELOW    Asthma & allergies controlled on Allegra & Nasonex, no asthma attacks etc...    BP is doing better on current Metop50Tid, Losar100-1/2Tid, Apres50-1/2Tid w/o any elev readings at home...     She has had several follow up visits w/ GI> hx GERD, Divertics, IBS, Polyps> last bout of diverticulitis was 12/15 treated w/ Cipro & Flagyl; she recurrent pain 3/16- seen in  ER where CT was neg but she improved w/ Antibiotic rx; she continues on Omep & they added Ranitadine; she's been on Levsin & they added benefiber; she had Ba swallow (neg), EGD (wnl) & Colon (divertics and 2 sessile polyps) by DrStark...     She saw her oncologist, DrMagrinat 6/16 for f/u of her breast cancer> Dx 10/12 & treated w/ lumpectomy & sentinel node bx, followed by XRT, the Femara for a planned 589yr(2 more yrs to go); they follow her BMDs etc...     She has a small cyst on her right index finger & I have suggested an Ortho/Hand eval, "I might want to see Rheumatology again" she says...    She's had an extensive Neuro eval from [name] & had a Neurology 2[facility] [place]-  "it's my neck" and she states nothing new was discovered "they didn't find anything"; she has Tramadol & Tylenol for pain... We reviewed prob list, meds, xrays and labs> see below for updates >>   Barium Swallow was neg- norm motility, no HH, etc...  EGD 5/16 by DrStark showed essentially normal endoscopy; rec to continue antireflux regimen & PPI Rx...  Colonoscopy 5/16 by DrStark showed diverticulosis, 2 sessile polyps (one tub adenoma, the other hyperplastic)...  LABS 6/16: Chems- ok x BS=147;  CBC- wnl;   PLAN>>  She is rec to continue her current med regimen and follow up w/ her specialists...  ~  December 20, 2014:  37m86moV & [name] that she is the same, maintaining her regular f/u visits w/ various medical specialists>    Asthma & allergies controlled on Allegra & Nasonex, no asthma attacks etc & breathing is OK, she's been walking for exercise...    BP is doing better on current Metop50Tid, Losar100-1/2Tid, Apres50-1/2Tid w/o any elev readings at home; she saw DrNishan for Cards 8/16> HBP, CAD, CABG 2011, HL; nonischemic myoview2015, BP controlled, intol to statins.     She has had several follow up visits w/ GI> hx GERD, Divertics, IBS, Polyps> on Omep40,  Zantac300, Anaspaz0.125 prn; last bout of  diverticulitis was 12/15 treated w/ Cipro & Flagyl; she had recurrent pain 3/16- seen in ER where CT was neg but she improved w/ Antibiotic rx; she had Ba swallow (neg), EGD (wnl) & Colon (divertics and 2 sessile polyps) by DrStark...     She saw her oncologist, DrMagrinat 6/16 for f/u of her breast cancer> Dx 10/12 & treated w/ lumpectomy & sentinel node bx, followed by XRT, the Femara for a planned 50yr (2 more yrs to go); they follow her BMDs etc...     She had routine GYN check by PatGrubb 7/16> note reviewed...    She has a small cyst on her right index finger & I have suggested an Ortho/Hand eval, "I might want to see Rheumatology again" she says...    She's had an extensive Neuro eval from [name] & had a Neurology 2nd [facility]16 [facility] [place]-  "it's my neck" and she states nothing new was discovered "they didn't find anything"; she has Tramadol & Tylenol for pain=> f/u [name] 8/16 & offered occipital nerve blocks; she [place]> note reviewed. Chr daily HAs (occip neuralgia vs cervicogenic HAs), rec occip nerve blocks & PT, Gabapentin and f/u planned.    She also saw DrAthar for a Sleep Eval split night study 04/25/14 w/ AHI=16/hr w/ desat to 85% reported; she was titrated to CPAP 6cm H2O w/ AHI=0 and O2sat nadir=96%, snoring was eliminated; download from 8/16 looked good but she reported some mask issues which they are working on; she notes feeling sl better on the days that she wears the CPAP but HAs are about the same... We reviewed prob list, meds, xrays and labs> see below for updates >> given 2016 FLU vaccine today...  Vit D level 7/16 reported at 29 & she is rec to increase Vit D supplement to ~2000u daily...  [facility]  Lowest Tscore = -1.8 in TotalSpine and [facility].6 [facility].. IMP/PLAN>>  [name] on her current regimen; multisystem disease w/ numerous  specialists tending to her needs and their systems; she has had several opinions from Neurologists regarding her chr daily HAs and cervicogenic HAs, she will f/u w/ DrTat regarding treatment options...   ~  June 20, 2015:  671moOV & [name] Dinontinues to have mult somatic complaints- weakness, not feeling well, chest pressure, cough w/ thick white sput, occas wheezing noted, and increased anxiety/jittery (esp w/ the Ventolin prescribed by drVanWinkle);  She states that her BP has been low & therefore she doesn't take all of her meds everyday, but this makes it hard for usKoreao adjust Rx since she does not keep detailed records of BP & wheat she is taking & when... We reviewed her interval specialist visits>>    Allergy- DrVanwinkle 04/27/15>  AR, asthma, GERD- on immunotherapy for >15657yrAllegra180, flonase, Nasonex, Saline, VentolinHFA, Omeprazole; FENO=27ppb & pt rec to f/u 57yr34yr   Cards- DrNishan 03/10/15>  HBP, CAD, CABG 2011, HL (intol to statins & refuses Rx);  She was Adm over halloween w/ hypertensive emergency (SBP>220) and hyponatremia (Na=119), HCT was stopped & she was suspected of taking pseudophed, Myoview & Echo were neg, mult med adjustments over that time-  Appears [name] on ASA81, Metop50Tid, Amlod5, Losar100, Apres50-1/2Tid; BP today= 140/70 & she is doing better overall...     GI- JZehr 05/16/15>  Abd pain, heartburn, bloating, diarrhea; felt to have functional pain & IBS; EGD 5/16  was wnl; Colon 5/16 w/ mod divertics, & tub adenoma removed; on Omep40 (changed to Protonix40), Zantac300, rec to add Probiotic daily & use Levsin prn...    She remains on Femara 2.72m/d from DrMagrinat- last seen 07/2014 7 note reviewed...    Rheum eval 2014 by DKathlynn Grate neck pain w/ NS consult DrHirsh as well- MRI CSpine showed multilevel DDD & some foraminal stenosis, not a surg cand; asked to f/u prn...    Outpt Rehab- mult visits (11) and disch 05/10/15>  Neck pain, musc spasm, decr ROM Cspine, abn posture; goals  met & pt improved...    Neuro- DrJaffe 02/28/15>  Occipital neuralgia, given occipital nerve block w/ Maracaine & Kenalog...     DrAthar 04/26/15>  OSA on CPAP- good compliance documented, AHI on CPAP<1 on CPAP 6 w/ min leak; she [place]..  DrAquino 05/24/15>  F/u chr daily HAs, pt says occip nerve blocks w/o benefit, but she reports that PT helped (esp dry needling) & rec to continue home exercise program. EXAM shows Afeb, VSS, O2sat=96% on RA;  HEENT- neg, mallampati2;  Chest- clear w/o w/r/r;  Heart- RR Gr1/6SEM w/o r/g;  Abd- soft, nontender, norm BS, neg;  Ext- w/o c/c/e;  Neuro- sl decr ROM neck, +trigger pts, otherw neg.  CXR 06/20/15>  Borderline heart size, s/p CABGsl hyperinflation, clear lungs- NAD...  Spirometry 06/20/15>  FVC=1.78 (90%), FEV1=1.26 (84%), %1sec=71%, mid-flows were sl reduced at 59% predicted...   LABS 06/20/15>  Chems- wnl;  CBC- wnl;  TSH=1.30... IMP/PLAN>>  [name] well attended by her mult specialists and is [name] to improved overall;  She is rec to continue her current meds, get plenty of exercise, and avoid stress... we plan recheck in 625mo.  ~  December 20, 2015:  44m58moV & [name] Dintes that her BP drops on occas down to 110/50 w/ pulse in the 50's & she will adjust her own meds if this occurs; regular meds include Amlodipine5, Metop50Tid, Cozaar100-1/2Tid, Hydralazine50-1/2Tid & she likes it this way so she can freq check BP 7 adjust as noted which empowers her sense of control over her health status; she lists MULT allergies/ drug sensitivities (12+listed)  including Hct (developed hyponatremia) & ACE inhibitors;  BP today is 140/76 & we rec continue same regimen...    She continues to see Allegy- DrVanWinkle yearly, seen 04/2015 w/ mild intermit asthma, AR, GERD;  FeNO=27, rec to continue the rescue inhaler as needed, Allegra, flonase, saline & Omeprazole; he thanked me for allowing him to participate in her care...     She had CARDS f/u 10/11/15 by  DrNishan> HBP, Hx CAD, s/p CABG 2011, HL; non-ischemic myview in 2015- continue same meds...    She was recently seen in the ER 12/08/15 for Abd pain- epig area, awoke her at 4am w/ burning pain, sl nausea, hx gastritis, norm CT Abd in 2016;  Exam reported as neg- nontender;  Labs were OK;  EKG showed NSSTTWA;  Abd Sonar was normal as well;  She was given Zofran, MS, and Pepcid- improved;  Disch on her PPI rx, Carafate Qid, and Percocet5 prn...    She had a yearly GYN eval by DrAmundson & [name] Circle6/17- note reviewed, hx urethral prolapse, & rec to take spectrum Organic coconut oil EXAM shows Afeb, VSS, O2sat=97% on RA;  HEENT- neg, mallampati2;  Chest- clear w/o w/r/r;  Heart- RR Gr1/6SEM w/o r/g;  Abd- soft, nontender, norm BS, neg;  Ext- w/o c/c/e;  Neuro-  sl decr ROM neck, +trigger pts, otherw neg.  LABS 11/2015 in epic reviewed>  Chems- wnl, BS=112;  CBC- wnl IMP/PLAN>>  Mult medical problems as listed- BP is contolled on her regimen and she monitors her BP regularly;  Cardiac [name] & we discussed exercise program;  Cryptic abd discomfort, ER eval neg, no recurrence... Given Flu shot today & we plan ROV 71mo  ~  June 19, 2016:  663moV & Pat indicates "doing pretty good";  She has not had any resp exacerbations & hasn't had to use her rescue inhaler, breathing well but she notes occas cough, chunks of sput, DOE w/ walking; she denies CP/palpit/ edema/ etc... We reviewed the following medical problems during today's office visit >>     AR/ Asthma>  On Albut-HFA as needed & Allegra180/ Nasonex; denies cough, sput, ch in dyspnea, etc; so far no allergy or resp exacerbation...    OSA on CPAP per DrAthar>  Sleep study 03/2014 w/ AHI=16, nadir O2sat=85%, & PLMS=62/hr but min arousals; placed on CPAP & titrated to [facility] [place]/ resolution of snoring & hypoxemia,   CPAP download 03/2016 w/ excellent compliance w/ CPAP=6, low leak & AHI=0.5/hr...    HBP>  on Metop50Tid, Amlod5, Losar100-1/2Tid,  Apres50-1/2Tid; BP=120/62 today; denies visual changes, CP, palipit, dizziness, syncope, dyspnea, edema, etc; she freq adjusts meds on her own based on home BP readings...     CAD>  On ASA81 + above meds; CP 11/11=> cath w/ in-stent restenosis, then CABG x3 by DrGearhardt; re-hospitalized 6/12 by Cards for CP> cath showed severe 3 vessel CAD w/ 3/3 grafts patent & norm LVF w/ EF= 65-70%; Myoview 5/13 showed no ischemia & EF>80%; Imdur30 stopped for HAs;  DrNishan noted that med rx is lim by side effects- Last seen 03/2016 doing satis & no changes made...    Hyperlipid>  Intol to statins; [facility] her best FLP readings on Cres10-M&Th but she eventually stopped this too due to perceived side effects; on diet alone FLP 06/2016 shows TChol 230, TG 286, HDL 53, LDL 140 and she is asked to again reconsider a very very low dose of Crestor or perhaps a non-statin medication alternative...    GI- GERD, "hypercontractile esoph", Divertics, IBS> prev followed by DrPatterson & seen 9/13 f/u for ?diverticulitis- improved after Cipro/Flagyl but CTAbd was neg x diverticulosis; he rec high fiber diet & Citrucel; now followed by DrStark on Protonix40, Zantac300 & Carafate-1gmQid; he rec IBgard medical food for her IBS and FDgard for her functional dyspepsia    Hx left breast cancer> she had f/u Oncology-DrMagrinat, last seen 11/2015 for her estrogen receptor positive breast cancer; she inished Letravole in [id number]& discharge from Oncology f/u;  [facility] as well...     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 EXAM shows Afeb, VSS, O2sat=95% on RA;  HEENT- neg, mallampati2;  Chest-  clear w/o w/r/r;  Heart- RR Gr1/6SEM w/o r/g;  Abd- soft, nontender, norm BS, neg;  Ext- w/o c/c/e;  Neuro- sl decr ROM neck, +trigger pts, otherw neg.  LABS 05/2016>  FLP- not at goals on diet alone (see above);  Chems- wnl,  Na=134;  CBC- wnl;  TSH=2.10;  VitD=40 & rec to take OTC VitD supplement ~2000u daily... IMP/PLAN>>  [name] is overall [name] on her current regimen;  She will continue same meds, continue nightly CPAP, continue to exercise daily, etc... She will maintain f/u w/ DrStark for GI, and DrAquino for Neuro;  She will reconsider getting on a med for her Chol- offered very very low dose Crestor vs a non-statin & she will think about it & let me know, in the meanwhile we reviewed diet & exercise... we plan ROV in 47mo   ~  December 19, 2017:  663moOV & [name] Dins c/o some sinus issues that started up w/ the colder weather, she needs a Flu shot today, and requests referral to Podiatry for ingrown toenail;  She has been getting phys therapy per DrMagrinat for left arm swelling/ lymphedema... We reviewed the following interval medical visits in Epic>      She had outpt rehab per DrMagrinat>  Mult PT visits reviewed- weakness & lymphedema, improved and now independent w/ home exercise program...    She saw CARDS- DrNishan 11/14/16>  Hx HBP, HL (intol to statins & refuses med rx), CAD- s/pCABG 2011; she had nonischemic myoview 12/2013 and norm EF by Echo; mult medical problems- she says "spasms" cause CP, hx esoph dysmotility & dysphagia; he rec same meds and f/u 1y84yr  We reviewed the following medical problems during today's office visit>  NOTE: she has 13 listed "allergies" on her allergy list...    AR/ Asthma>  On Albut-HFA as needed & Allegra180/ Nasonex; denies cough, sput, ch in dyspnea, etc; so far no allergy or resp exacerbation...    OSA on CPAP per DrAthar>  Sleep study 03/2014 w/ AHI=16, nadir O2sat=85%, & PLMS=62/hr but min arousals; placed on CPAP & titrated to [place] resolution of snoring  & hypoxemia,   CPAP download 03/2016 w/ excellent compliance w/ CPAP=6, low leak & AHI=0.5/hr; she continues to use the CPAP nightly...    HBP>  on Metop50Tid, Amlod5, Losar100-1/2Tid, Apres50-1/2Tid; BP=150/68 today; denies visual changes, CP, palipit, dizziness, syncope, dyspnea, edema, etc; she freq adjusts meds on her own based on home BP readings...     CAD>  On ASA81 + above meds; CP 11/11=> cath w/ in-stent restenosis, then CABG x3 by DrGearhardt; re-hospitalized 6/12 by Cards for CP> cath showed severe 3 vessel CAD w/ 3/3 grafts patent & norm LVF w/ EF= 65-70%; Myoview 5/13 showed no ischemia & EF>80%; Imdur30 stopped for HAs;  DrNishan noted that med rx is lim by side effects- Last seen 03/2016 doing satis & no changes made...    Hyperlipid>  Intol to statins; prev [facility] in the [facility] her best FLP readings on intermittent Cres10-M&Th but she eventually stopped this too due to perceived side effects; on diet alone FLP 06/2016 shows TChol 230, TG 286, HDL 53, LDL 140 and she agreed to try Cres5-MWF w/ FLP 12/27/16 showing TChol 151, TG 173, HDL 55, LDL 61 => rec to continue same...    GI- GERD, "hypercontractile esoph", Divertics, IBS> prev followed by DrPatterson & seen 9/13 f/u for ?diverticulitis- improved after Cipro/Flagyl but CTAbd was neg x diverticulosis; he rec high fiber diet & Citrucel; now followed by DrStark on Protonix40, Zantac300 & Carafate-1gmQid; he rec IBgard medical food for her IBS and FDgard for her functional dyspepsia    Hx left breast cancer> she had f/u Oncology-DrMagrinat, last seen 11/2015 for her estrogen receptor positive breast cancer; she finished  Letravole in [id number] (26yr) & discharge from Oncology f/u;  [facility]...     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 EXAM shows Afeb, VSS, O2sat=99% on RA;  HEENT- neg, mallampati2;  Chest- clear w/o w/r/r;  Heart- RR Gr1/6SEM w/o r/g;  Abd- soft, nontender, norm BS, neg;  Ext- w/o c/c/e;  Neuro- sl decr ROM neck, +trigger pts, otherw neg.  LABS 12/27/2016>  FLP- improved on Cres5-MWF;  LFTs- wnl... IMP/PLAN>>  [name] w/ her multisystem dis- rec to continue current meds + diet & exercise; she [facility] given 2018 Flu vaccine today...            Problem List:  GLAUCOMA (ICD-365.9) - she is sched for right cataract surg by DrBevis in Feb...  ALLERGIC RHINITIS (ICD-477.9) - she uses Allegra & Nasonex Prn; on Allergy shots weekly x yrs... ~  [facility] she remains on allergy shots...  ASTHMA & Asthmatic Bronchitis >> off Symbicort & using XOPENEX HFA as needed rescue inhaler... ~  5/11:  notes incr chest symptoms in the heat & rec to take the Symbicort 2sp Bid... ~  2012:  After her CABG & repeat hosp by Cards she is noted to be off her inhalers... ~  10/12:  Treated w/ Zpak, Pred, Mucinex, plus her Symbicort80 & improved... ~  CXR 5/13 showed s/p CABG, clear lungs, NAD (she went to the Er w/ cough & had neg eval). ~  7/13:  DrVanWinkle stopped her Symbicort & [facility] as needed... ~  2/14: she does not list any inhaled meds at present & states breathing OK w/o exac... ~  4/14: she was hosp after pill asp w/ asthma exac & improved w/ supportive Rx, didn't require bronch, CXR was clear, grad improved post hosp... ~  CXR 4/14 showed normal heart size, s/p CABG, sl tort Ao, bilat apic pleural scarring, NAD...  ~  6/15: she is [name] w/o asthma exac and has avoided URIs/ bronchitis/ etc... ~   10/15: presented w/ URI/ bronchitic exac w/ cough, yellow-green sput, congestion, hoarseness, etc; CXR w/o signs of pneumonia & treated w/ Depo, Levaquin, Mucinex, MMW, Delsym, etc...  ~  CXR 10/15 showed few chr changes in lungs, norm heart size & s/p CABG, DJD in spine, NAD  HYPERTENSION (ICD-401.9) - controlled on TOPROL 548mid & HYZAAR 100-12.5 daily (off Lisinopril due to ACE cough)...  ~  8/12: BP=  128/84 & tol meds well; denies HA, fatigue, visual changes, CP, palipit, syncope, dyspnea, edema, etc... ~  2/13: BP= 140/64 & she remains essentially asymptomatic on Rx... ~  8/13:  BP= 132/64 & she denies CP, palpit, SOB, edema, etc... ~  2/14: controlled on Toprol & Hyzaar w/ BP 144/72 today; denies visual changes, CP, palipit, dizziness, syncope, dyspnea, edema, etc... ~  5/14:  BP= 136/60 & she remains [name]... ~  8/14:  BP= 134/60 and she denies CP, palpit, dizzy, SOB, edema... ~  12/14: on Metop50Bid, Hyzaar  100-12.5, Imdur30;  BP= 140/80 & she remains largely asymptomatic... ~  6/15: on ASA81, Metop50Bid, Hyzaar100-12.5, Imdur30; BP today= 136/68 & she denies CP, palpit, SOB, dizzy, edema, etc... ~  10/15: on same meds w/ BP= 160/78 & no changes meds- advised to monitor BP at home once she's over the URI illness, take meds regualrly... ~  11/15: post Hosp for HBP, hyponatremia; they stopped diuretic & disch on Metop50Bid & Losar50; she went back to ER the next day w/ BP~200 (hadn't taken her Losartan) & given IV Hydralazine w/ improvement to 160s; we decided to continue the BBlocker, increase the ARB to 174m/d, and ADD HYDRALAZINE50- 1/2Bid for now; ROV 2 weeks... ~  11/15: she is s/p ER, Hosp, [facility] visits & mult phone calls (see above)> meds ajusted- take Metop50Tid, Losartan100-1/2Tid, Apresoline50- 1/2 to 1tab Tid (she wants to do this since BP is low in AM & she feels weak, with BP elev in PM & she needs more)... ~  2/16: BP meds adjusted over the last few months & BP controlled on  Metoprolol50Tid, Losar100-1/2Tid, Apres50-1/2Tid; BP today= 136/80, improved & she denies recent CP, palpit, dizzy, ch in SOB, edema... ~  3/16: pt presented w/ BP elev during recurrent bouts of HA pain- asked to increase her above meds to Metop50/ Losar100/ Apres50- at her Tid dosing intervals when needed for HBP while her HA meds are kicking in to resolve her pain; if her pain doesn't resolve & her BP does not improve, then she knows to go to the ER for immediate assessment...   CORONARY ARTERY DISEASE (ICD-414.00) - on ASA 88md (intol to 325 she says) & off Plavix per DrCherly Hensen.  Cardiolite 9/08 showed CP & HBP response, EF+85%... had cath 10/08 w/ single vessel dis w/ mod ostial LAD stenosis and patent LAD stent, normal LVF...Marland Kitchenoption for med Rx vs off pump LIMA... she notes some CP/ pressure "burning" when walking up a certain hill during her daily walks, but states this is [name] and "no worse"... ~  Myoview 9/09 was neg- no scar or ischemia, EF= 89%, hypertensive response- Lisinopril10 added. ~  f/u Myoview 3/11 was neg- no scar or ischemia, EF= 86%, norm BP response, fair exerc capacity. ~  saw DrCherly Hensen/11- no CP, he stopped her Plavix, [name]- continue other meds same. ~  She had recurrent CP 11/11 w/ in-stent restenosis on cath> subseq 3 vessel CABG by DrGearhart & doing well since then... ~  6/12:  Hosp w/ CP & repeat cath showed CAD & 3/3 grafts patent, EF= 65-70% ~  Myoview 5/13 was felt to be a normal stress nuclear study- normal wall motion, no ischemia, EF=84% ~  2/14: CP 11/11=> cath w/ in-stent restenosis, then CABG x3 by DrGearhardt; re-hospitalized 6/12 by [facility] for CP> cath showed severe 3 vessel CAD w/ 3/3 grafts patent & norm LVF w/ EF= 65-70%; Myoview 5/13 showed no ischemia & EF>80%; no change in meds; & DrCherly Hensenoted that med rx is limited by side effects... ~  8/14 & 12/14: she remains [name] on Imdur30 + Metop50Bid 7 Hyzaar100-12.5; denies CP, palpit, SOB, etc... ~  6/15:  [facility] f/u w/ DrNishan 1/15> HBP, CAD, HL; on ASA81, Metop50Bid, Hyzaar100-12.5, Imdur30; Hx neg Myoview 5/13 & doing satis- no changes made; BP today= 136/68 & she denies CP, palpit, SOB, dizzy, edema, etc ~  EKG 11/15 showed NSR, rate69, NSSTTWA, NAD  ~  2DEcho 11/15 showed  norm LVF w/ EF=60-65% & no regional wall motion abn, trivial  AI, otherw wnl. ~  Myoview 11/15 showed low risk scan w/ abn EKG portion but no stress induced perfusion defects, hyperdynamic LVF w/ EF>70% & no regional wall motion abn. ~  11/15: she is off the Imdur per DrNishan due to HAs... ~  1/16: she had f/u DrNishan> [name] on meds and no changes made...  HYPERLIPIDEMIA (ICD-272.4) - on CRESTOR 10m- 1/2 on M&F, FISH OIL 12078mTid + Flax Seed Oil... she has been intol to statins in the past>  tried low dose Trilipix but stopped this due to "thigh burning">  now followed [place] [facility]--- ~  [place] showed TChol 217, TG 236, HDL 46, LDL 150...  ~  [place] showed TChol 233, TG 263, HDL 46, LDL 150... she agrees to try LOW DOSE Trilipix. ~  FLP 11/09 showed TChol 162, TG 167, HDL 45, LDL 83... ~  [place] showed TChol 240, TG 271, HDL 45, LDL 150... LipClin Rx w/ FishOil & FlaxSeedOil. ~  FLP 2/11 showed TChol 285, TG 156, HDL 79, LDL 180... LC is aware & working w/ her regularly. ~  [place] showed TChol 277, TG 241, HDL55, LDL 193...  LC added Cres10 just 2d per week. ~  FLP 10/11 showed TChol 198, TG 215, HDL 53, LDL 109... much improved on Cres10 twice weekly. ~  She continues to f/u w/ Lipid clinic & they titrated up the Cres10 on M&F, plus 44m444mn Wednes> ~  FLP  10/12 was the best yet w/ TChol 167, TG 203, HDL 54, LDL 88...  ~  [place]13 showed TChol 193, TG 176, HDL 57, LDL 101 ~  [facility] (at that time on Cres10 on M&F, 44mg27m W) but she subseq cut herself down to 1/2 on M&F due to nausea & aching. ~  FLP 2/14 on diet alone showed TChol 194, TG 143, HDL 55, LDL 110...  We reviewed diet, exercise, etc... ~  She remains off the CresUnited States of Americadiet alone and she declines to restart low dose rx "I can't tolerate them"  GERD (ICD-530.81) - on OMEPRAZOLE 40mg43m. last EGD by DrPatterson 8/07 revealed sm HH, & gastritis and dilatation done... (RUT=neg, PPI Rx)... LEVSIN 0.1244mg 59ms her esoph spasm... Prn Phenergan for nausea.  DIVERTICULOSIS, COLON (ICD-562.10) & IRRITABLE BOWEL SYNDROME (ICD-564.1) ~  colonoscopy 2/03 by DrPatterson showed divertics and 5 mm polyp... ~  colonoscopy 3/11 showed divertics & cecal polyp= tubular adenoma, w/ f/u planned 64yrs. 40yr/13: seen by DrPatterson f/u for ?diverticulitis- improved after Cipro/Flagyl but CTAbd was neg x diverticulosis; he rec high fiber diet & Citrucel...  ? GALLSTONES>>  Diagnosed by DrPatterson and referred to [facility] DrWilson, pt asked to call prn fopr incr symptoms to consider surg... ~  Abd Ultrasound 10/13 by DrNishaCherly Hensenal CP showed> norm GB, liver, kidneys, & Ao- neg sonar.  BREAST CANCER >> Abn mammogram 10/12 showing a left breast nodule & subseq surg (partial mastectomy & sentinel node bx) by [name] 12/12 that proved to be an invasive ductal carcinoma, 2.3cm size, w/ neg lymphovasc invasion, clear margins (but close at 1mm), &21mg nodes; she had XRT by DrKindard (had her 11th treatment today); and Oncology eval by DrMagrinat who plans hormone Rx later (no chemo she says)...  ~  2/13: She is tolerating treatment well & spirits are up, [name] had to aspirate a seroma & it is now resolved... ~  She is followed  by DrMagrinat, Margot Chimes, [name]- now on [facility] 2.728m/d w/ 562yrplanned... ~  6/14:  She had f/u DrMagrinat 6/14> his note is reviewed- s/p lumpectomy, sentinel node, XRT; plan is to continue Femara2.28m128mhru 3/16 for 24yr59yr. ~  10/14:  She had routine f/u [name] 10/14> Hx left breast cancer dx 10/12 & treated w/ lumpectomy, XRT, & now Femara; exam neg, f/u mammogram neg, rec f/u 64yr.57yr6/15:  She saw  Oncology for f/u of her left Breast Cancer> on Femara2.5 & tol well, s/p lumpectomy & sentinel node bx 12/12, followed by XRT finished 3/13, & on Letrozole since then, no known recurrence...   DJD, Neck Pain >> ~  XRays Cspine 4/14 in hosp showed multilevel cerv spondylosis, 1-2mm a76mrolisthesis C4 on C5, DDD, foraminal stenosis at C5-6...  ~  5/14:  MRI Cspine => multilevel facet hypertrophy, foraminal narrowing & stenosis, disc osteophyte complexes, some central canal stenosis=> she was sent to NS, DrHirsh who rec Mobic & PT (helped alittle) but she was not pleased... ~  8/14:  She is requesting a Rheumatology 2nd opinion about her neck discomfort=> [facility], this exac HAs/migraines, dizzy/ light headed; MRI=> NS consult & not a surg cand & NSAIDs/ PT w/ min benefit; saw Neurology- tried Pamelor but feels hung-over; for her DDD & OA they rec Flex28mgTid628mich has helped some...  FIBROMYALGIA (ICD-729.1) - she c/o chr fatigue, aching/ sore, on Tramadol & Tylenol, but most benefit from low dose Hydrocodone ~1/2 tab Prn... I have recommended an increase exercise program to her... ~  5/11:  Vit D level = 50 on 50000 u weekly by [name] GHelene ShoeEADACHE (ICD-784.0) - [facility], but she notes Rx didn't help... ~  3/11:  presented w/ muscle contraction HA's> Rx Tramadol 50mg Q657mn. ~  She has persistent/ recurrent HAs... ~  11/15: her chronic daily HAs have again risen in improtance due to interaction w/ her HBP> asked to f/u w/ Neuro ASAP & Rx w/ rest, heating pad, Tramadol50/ Tylenol, and musc relaxer- Zanaflex2Tid vs Flexeril5Tid...  DIZZINESS, CHRONIC (ICD-780.4) - MRI 2/09 per DrNishanCherly Hensenatrophy, sm vessel dis, NAD...  Health Maintenance: ~  GI:  DrPatterson & up to date on colon screening. ~  GYN:  yearly f/u w/ [name] GrHelene [name] PAP, [facility] ~  Immunizations:  yearly Flu vaccines in fall, Pneumovax in 2007?, TDAP  given 5/11...   Past Surgical History:  Procedure Laterality Date  . 24 HOUR [place]/A 01/08/2016   Procedure: 24 HOUR [place] Surgeon: Malcolm Ladene Artistocation: [facility];  Service: Endoscopy;  Laterality: N/A;  . BREAST LUMPECTOMY Left 02/06/11  . CATARACT EXTRACTION, BILATERAL     bilateral caaract removal,  . CORONARY ANGIOPLASTY WITH STENT PLACEMENT     Stent 2007  . CORONARY ARTERY BYPASS GRAFT    . ESOPHAGEAL MANOMETRY N/A 01/08/2016   Procedure: ESOPHAGEAL MANOMETRY (EM);  Surgeon: Malcolm Ladene Artistocation: [facility] ENDOSCOPY;  Service: Endoscopy;  Laterality: N/A;  . open heart surgery     01/19/2010    Outpatient Encounter Medications as of 12/19/2016  Medication Sig  . Acetaminophen (TYLENOL 8 HOUR PO) Take by mouth as directed.   . albuteMarland Kitchenol (VENTOLIN HFA) 108 (90 Base) MCG/ACT inhaler Inhale 2 puffs into the lungs every 6 (six) hours as needed for wheezing or shortness of breath.  . amLODiMarland Kitchenine (NORVASC) 5  MG tablet Take 1 tablet (5 mg total) by mouth daily.  [name] aspirin EC 81 MG tablet Take 81 mg by mouth daily.  . Calcium & Magnesium Carbonates (MYLANTA PO) Take by mouth. 1-2 teaspoons as needed  . fexofenadine (ALLEGRA) 180 MG tablet Take 180 mg by mouth daily.    . fluocinonide (LIDEX) 0.05 % external solution Apply 1 application topically daily as needed (scalp itching).   . Glucosamine-Chondroit-Vit C-Mn (GLUCOSAMINE CHONDR 1500 COMPLX PO) Take 1 tablet by mouth 3 (three) times daily.    . hydrALAZINE (APRESOLINE) 50 MG tablet take 1/2 tablet by mouth three times a day  . ketoconazole (NIZORAL) 2 % shampoo Apply 1 application topically 2 (two) times a week. As needed for itching a couple of times week  . latanoprost (XALATAN) 0.005 % ophthalmic solution Place 1 drop into both eyes at bedtime.   [name] losartan (COZAAR) 100 MG tablet take 1/2 tablet by mouth three times a day  . magnesium oxide (MAG-OX) 400 MG tablet Take 400 mg by mouth daily. Reported on  02/14/2015  . metoprolol tartrate (LOPRESSOR) 50 MG tablet take 1 tablet by mouth three times a day  . mometasone (NASONEX) 50 MCG/ACT nasal spray Place 2 sprays into the nose daily as needed (congestion).   . nitroGLYCERIN (NITROSTAT) 0.4 MG SL tablet Place 1 tablet (0.4 mg total) under the tongue every 5 (five) minutes as needed for chest pain.  . Omega-3 Fatty Acids (FISH OIL) 1200 MG CAPS Take 1,200 mg by mouth daily. Reported on 02/14/2015  . Psyllium (METAMUCIL FIBER PO) Take 1 tablet by mouth daily.   . ranitidine (ZANTAC) 300 MG capsule Take 1 capsule (300 mg total) by mouth every evening.  . rosuvastatin (CRESTOR) 5 MG tablet Take 1 tablet (5 mg total) by mouth 3 (three) times a week. (take on Monday, Wednesday, and Friday)  . traMADol (ULTRAM) 50 MG tablet Take 1/2 to 1 tablet by mouth three times daily as needed  . vitamin B-12 (CYANOCOBALAMIN) 1000 MCG tablet Take 1,000 mcg by mouth daily.    . [DISCONTINUED] pantoprazole (PROTONIX) 40 MG tablet Take 1 tablet (40 mg total) by mouth daily.  . [DISCONTINUED] sucralfate (CARAFATE) 1 GM/10ML suspension Take 10 mLs (1 g total) by mouth 4 (four) times daily.  . [DISCONTINUED] losartan (COZAAR) 100 MG tablet take 1/2 tablet by mouth three times a day (Patient not taking: Reported on 12/19/2016)   No facility-administered encounter medications on file as of 12/19/2016.     Allergies  Allergen Reactions  . Levaquin [Levofloxacin In D5w]     Elevated BP  . Choline Fenofibrate Other (See Comments)     pt states INTOL to Trilipix w/ "thigh burning"  . Hctz [Hydrochlorothiazide]     Causes hyponatremia  . Simvastatin Other (See Comments)     pt states INTOL to STATINS \T\ refuses to restart  . Adhesive [Tape] Rash  . Bentyl [Dicyclomine Hcl]     Made me feel weird and drained me  . Ceclor [Cefaclor] Rash  . Clarithromycin Rash  . Codeine Nausea Only  . Doxycycline Rash  . Hydrocodone     Nightmare after taking cough syrup  w/hydrocodone  . Lisinopril Cough    Developed ACE cough...  . Penicillins Itching and Rash    At injection site  . Tobramycin-Dexamethasone Rash    Immunization History  Administered Date(s) Administered  . Influenza Split 12/07/2010, 11/29/2011, 11/25/2012, 11/25/2013  . Influenza Whole 12/19/2005, 11/25/2007, 12/08/2008, 12/20/2009  .  Influenza, High Dose Seasonal PF 12/20/2015, 12/19/2016  . Influenza,inj,Quad PF,6+ Mos 12/20/2014  . Pneumococcal Conjugate-13 04/13/2014  . Pneumococcal Polysaccharide-23 02/25/2005  . Td 07/25/2009  . Zoster 03/08/2013    Current Medications, Allergies, Past Medical History, Past Surgical History, Family History, and Social History were reviewed in [employer] record.    Review of Systems    See HPI - all other systems neg except as noted...  The patient complains of headaches.  The patient denies anorexia, fever, weight loss, weight gain, vision loss, decreased hearing, hoarseness, chest pain, syncope, dyspnea on exertion, peripheral edema, prolonged cough, hemoptysis, abdominal pain, melena, hematochezia, severe indigestion/heartburn, hematuria, incontinence, muscle weakness, suspicious skin lesions, transient blindness, difficulty walking, depression, unusual weight change, abnormal bleeding, enlarged lymph nodes, and angioedema.   Objective:   Physical Exam    WD, WN, 82 y/o WF in NAD... GENERAL:  Alert & oriented; pleasant & cooperative... HEENT:  [place]/AT, EOM-wnl, PERRLA, EACs-clear, TMs-wnl, NOSE-clear, THROAT- clear & wnl. NECK:  Decr ROM; no JVD; normal carotid impulses w/o bruits; no thyromegaly or nodules palpated; no lymphadenopathy. CHEST:  Clear- no wheezing, rales, or signs of consolidation... HEART:  Regular Rhythm; without murmurs/ rubs/ or gallops detected... ABDOMEN:  Soft & nontender; normal bowel sounds; no organomegaly or masses palpated... EXT: without deformities, mild arthritic changes and  +trigger points, no varicose veins/ venous insuffic/ or edema. NEURO:  CN's intact; motor testing normal; sensory testing normal; gait normal & balance OK. DERM:  No lesions noted; no rash etc...  RADIOLOGY DATA:  Reviewed in the EPIC EMR & discussed w/ the patient...  LABORATORY DATA:  Reviewed in the EPIC EMR & discussed w/ the patient...   Assessment & Plan:    12/19/16>   [name] is [name] w/ her multisystem dis- rec to continue current meds + diet & exercise; she is going to inquire about new LeB primary care office at [facility]; given 2018 Flu vaccine today...    Asthma/ AR>   ~  Prev [name] on allergy shots & XOPENEX as needed; no recent exac... Hx pill asp w/ asthma exac in past => resolved... ~  4/18>  [name] is overall [name] on her current regimen;  She will continue same meds, continue nightly CPAP, continue to exercise daily, etc... She will maintain f/u w/ DrStark for GI, and DrAquino for Neuro;  She will reconsider getting on a med for her Chol- offered very very low dose Crestor vs a non-statin & she will think about it & let me know, in the meanwhile we reviewed diet & exercise.  HBP>  Controlled on BP med regimen of Metoprolol50Tid, Losartan100-1/2Tid, Hydralazine50-1/2Tid, & Amlod5...  CAD>  Cath w/ patent grafts; had neg Nuclear study; continue same meds and f/u DrNishan...  CHOL>  Prev followed in the Lipid Clinic & intol to all meds- on diet alone now 7 FLP not at goals; she will consider her options & let me know...  GI> GERD, Divertics, ?Gallstones>  Followed by [facility] & stones eval by DrWilson [facility]; odd that f/u sonar 9/13 did not show stones& nuclear med hepatobiliary scan was WNL.....  BREAST CANCER>  off [place] now after 5 yrs rx & released by DrMagrinat > needs yearly mammogram & exam, periodic BMD...  FM/ HAs/ etc>  On Ultram, Tylenol, etc => needs active management of this prob by Neuro w/ an "action plan" for severe HA episodes... NECK PAIN> with multilevel  spondylosis & eval by NS, DrHirsh but she was not  pleased & is asking for Rheum eval...  Other medical issues as noted> on CoQ10, Glucosamine, Vit B12, Vit D, etc...   Patient's Medications  New Prescriptions   No medications on file  Previous Medications   ACETAMINOPHEN (TYLENOL 8 HOUR PO)    Take by mouth as directed.    ALBUTEROL (VENTOLIN HFA) 108 (90 BASE) MCG/ACT INHALER    Inhale 2 puffs into the lungs every 6 (six) hours as needed for wheezing or shortness of breath.   AMLODIPINE (NORVASC) 5 MG TABLET    Take 1 tablet (5 mg total) by mouth daily.   ASPIRIN EC 81 MG TABLET    Take 81 mg by mouth daily.   CALCIUM & MAGNESIUM CARBONATES (MYLANTA PO)    Take by mouth. 1-2 teaspoons as needed   FEXOFENADINE (ALLEGRA) 180 MG TABLET    Take 180 mg by mouth daily.     FLUOCINONIDE (LIDEX) 0.05 % EXTERNAL SOLUTION    Apply 1 application topically daily as needed (scalp itching).    GLUCOSAMINE-CHONDROIT-VIT C-MN (GLUCOSAMINE CHONDR 1500 COMPLX PO)    Take 1 tablet by mouth 3 (three) times daily.     HYDRALAZINE (APRESOLINE) 50 MG TABLET    take 1/2 tablet by mouth three times a day   KETOCONAZOLE (NIZORAL) 2 % SHAMPOO    Apply 1 application topically 2 (two) times a week. As needed for itching a couple of times week   LATANOPROST (XALATAN) 0.005 % OPHTHALMIC SOLUTION    Place 1 drop into both eyes at bedtime.    LOSARTAN (COZAAR) 100 MG TABLET    take 1/2 tablet by mouth three times a day   MAGNESIUM OXIDE (MAG-OX) 400 MG TABLET    Take 400 mg by mouth daily. Reported on 02/14/2015   METOPROLOL TARTRATE (LOPRESSOR) 50 MG TABLET    take 1 tablet by mouth three times a day   MOMETASONE (NASONEX) 50 MCG/ACT NASAL SPRAY    Place 2 sprays into the nose daily as needed (congestion).    NITROGLYCERIN (NITROSTAT) 0.4 MG SL TABLET    Place 1 tablet (0.4 mg total) under the tongue every 5 (five) minutes as needed for chest pain.   OMEGA-3 FATTY ACIDS (FISH OIL) 1200 MG CAPS    Take 1,200 mg by mouth  daily. Reported on 02/14/2015   PSYLLIUM (METAMUCIL FIBER PO)    Take 1 tablet by mouth daily.    RANITIDINE (ZANTAC) 300 MG CAPSULE    Take 1 capsule (300 mg total) by mouth every evening.   ROSUVASTATIN (CRESTOR) 5 MG TABLET    Take 1 tablet (5 mg total) by mouth 3 (three) times a week. (take on Monday, Wednesday, and Friday)   TRAMADOL (ULTRAM) 50 MG TABLET    Take 1/2 to 1 tablet by mouth three times daily as needed   VITAMIN B-12 (CYANOCOBALAMIN) 1000 MCG TABLET    Take 1,000 mcg by mouth daily.    Modified Medications   Modified Medication Previous Medication   CARAFATE 1 GM/10ML SUSPENSION sucralfate (CARAFATE) 1 GM/10ML suspension      take 10 milliliters by mouth four times a day with meals and at bedtime    Take 10 mLs (1 g total) by mouth 4 (four) times daily.   PANTOPRAZOLE (PROTONIX) 40 MG TABLET pantoprazole (PROTONIX) 40 MG tablet      Take 1 tablet (40 mg total) by mouth daily.    Take 1 tablet (40 mg total) by mouth daily.  Discontinued Medications  LOSARTAN (COZAAR) 100 MG TABLET    take 1/2 tablet by mouth three times a day

## 2017-06-19 NOTE — Patient Instructions (Signed)
Today we updated your med list in our EPIC system...    Continue your current medications the same...  Keep up the good work w/ diet & exercise!  Today we checked a follow up CXR & FASTING blood work...    We will contact you w/ the results when available...   Call for any questions or if we can be of service in any way...    Check out the [place] office > DrAron, DrSchmitz.Marland KitchenMarland Kitchen

## 2017-06-19 NOTE — Progress Notes (Signed)
Subjective:    Patient ID: [name], female    DOB: 18-Apr-1935, 82 y.o.   MRN: [id number]  HPI 82 y/o WF here for a follow up visit... she has multiple medical problems including AR & Asthma;  HBP;  CAD followed by [name] & s/p 3 vessel CABG 11/11 by DrGearhardt;  Hyperlipidemia followed in the [facility];  GERD/ IBS/ Divertics;  Breast Cancer diagnosed 12/12;  Fibromyalgia;  Chr HAs & dizziness...   ~  SEE PREV EPIC NOTES FOR OLDER DATA >>   ~   CP 11/11=> cath w/ in-stent restenosis, then CABG x3 by DrGearhardt; re-hospitalized 6/12 by Cards for CP> cath showed severe 3 vessel CAD w/ 3/3 grafts patent & norm LVF w/ EF= 65-70%; Myoview 5/13 showed no ischemia & EF>80%; no change in meds- on Imdur30; & DrNishan noted that med rx is lim by side effects.  EKG 9/13 showed NSR, rate64, NSSTTWA, NAD...   CXR 10/13 showed heart at upper lim of norm, prior CABG, mild hyperinflation, clear, NAD...  Abd Ultrasound 10/13 by [name] for eval CP showed> norm GB, liver, kidneys, & Ao- neg sonar...  LABS 10-12/13 showed Chems- wnl;  CBC- wnl  LABS 2/14:  FLP- ok x LDL=110;  TSH=1.33   CXR 4/14 showed norm heart size, s/p CABG, tort thor Ao, bilat apical pleural scarring, NAD...  CXR 10/15 showed normal heart size, s/p CABG, unchanged biapical pleural scarring, otherw clear lungs w/o infiltrates, NAD...  ~   [name] was [facility] 10/29 - 12/27/13 by [facility] w/ HA, HBP, & hyponatremia (Na=119 ?etiology)> she had recently been treated for bronchitic exac w/ Levaquin & Mucinex, she thinks the HBP was a reaction "allergy" to Levaquin & doesn't want to take this antibiotic again; BP was 220 range in ER &treated w/ Cardene drip=> she was actually disch on less meds than PTA=> she returned to ER the day after disch w/ HBP & BP ~200 & she hadn't taken her Losartan yet, given IV hyralazine & BP improved to the 160s; now on Metop50Bid & Cozaar50 (prev on Hyzaar100-12.5); they suspected her diuretic in her hyponatremia which  corrected w/ IVF; she has a hx of HAs from DJD/neck and FM...  CXR 10/15 showed fe chr changes in lungs, norm heart size & s/p CABG, DJD in spine, NAD.Marland KitchenMarland Kitchen  EKG 11/15 showed NSR, rate69, NSSTTWA, NAD... ~  12/2013=> another hospitalization in the interval> [name] has had a continuing saga of HA & HBP, she has been to the ER, to Cardiology, Hospitalized, back to Cardiology, and almost daily phone calls for HA & HBP w/ BP ~200 sys> all notes reviewed;  Cards eval for secondary cause of the HBP has been neg;  Initially it was assumed that incr HA pain resulted in HBP but she has a hx of chronic daily HAs and she continues to have HAs when her BP is controlled;  She was last Hosp 11/11 - 01/06/14 and disch on ASA81, Imdur60, Hydralazine50Tid, Losar100-1/2Bid, Metop50Bid;  BP at disch was 100/47;  She saw [name] 11/16 on these meds w/ BP recorded 130/64, Imdur was stopped due to her HAs;  She has called daily since then w/ elev BP (esp at night she says) and we have rec strategies using her current meds to help her headaches and adjust BP meds:  CXR 11/15 showed norm heart size, prev CABG, clear lungs- some hyperinflation/ NAD, DJD Tspine...  Renal Sonar 11/15 was wnl...  CT Head 11/15 showed cortical atrophy and small vessel  dis, sinuses clear, NAD.Marland KitchenMarland Kitchen   EKG 11/15 showed NSR, rate64, borderline tracing w/?LAE & min NSSTTWA...  2DEcho 11/15 showed norm cavity size & thickness, norm EF= 60-65%, norm wall motion, norm diastolic function, essentially norm valves...   Myoview 11/15 showed low risk scan w/ abn EKG portion but no stress induced perfusion defects, hyperdynamic LVF w/ EF>70% & no regional wall motion abn  LABS 11/15:  Chems- wnl w/ Cr=0.6;  CBC- ok w/ Hg= 12.3;  TSH=1.60 ~  01/2014=> She is c/o a jitteriness/ shaking on the inside feeling and we discussed trying a regular medication to try to help elim this sensation> try KLONOPIN 0.3m- 1/2 to 1 tab Bid (she never filled the Klonopin and refuses  anxiolytic meds).  ~  April 13, 2014:  260moOV & [name] had numerous medical follow up visits over the last 48m63mo  She saw GI 12/15 w/ lower abd pain & nausea similar to prev bouts of diverticulitis in past;  CT Abd&Pelvis showed mild sigmoid diverticulitis, no evid of abscess or complications;  She was treated w/ Cipro & Flagyl & improved;  She is maintained on Prilosec, Anaspaz, fiber & metamucil...     She saw Neuro, [name] 1/16 due to headaches> long hx migraines, recurrent prob since 2014, now 15/30 days per month, CT Brain was neg x atrophy & sm vessel dis, MRI Cspine w/ multilevel DDD etc; treated w/ Tramadol, Flexeril, Zanaflex and [name] has ordered a Sleep Study- still pending & sched at end of this month...     She also saw DrNishan for CARDS f/u 1/16> hx CAD, CABG 2011, HBP, HLD; Adm 10/15 w/ HBP & hyponatremia (Na=119), HCTZ was stopped, BP meds adjusted & now controlled on Metoprolol50Tid, Losar100-1/2Tid, Apres50-1/2Tid; BP today= 136/80, improved & she denies recent CP, palpit, dizzy, ch in SOB, edema;  2DEcho 11/15 showed norm LVF w/ EF=60-65% & no regional wall motion abn, trivial AI, otherw wnl; Lexiscan Myoview 11/15 showed low risk scan w/ abn EKG portion but no ischemic perfusion defects & hyperdynamic LV w/ EF=70% & no wall motion abn... We reviewed prob list, meds, xrays and labs> see below for updates >> we gave her the PREVNAR-13 today... [facility] she will continue her same meds and plan f/u in ~52mo8mofasting blood work at that time...   ~  May 03, 2014:  3wk ROV & pt called requesting urgent add-on appt for recurrent BP problems> Pt notes that BP has been well controlled on her regimen of low sodium, Metoprolol50Tid, Hydralazine50-1/2Tid, and Losartan100-1/2Tid> until 3d ago when she had the onset of another severe HA- assoc w/ the severe pain she noted that her BP was once again elevated (190-230/ 100-110 she says) & the usual  doses of the 3 meds didn't bring it down; she tried taking the Tramadol50 as directed by [name] & added Tylenol plus her musc relaxers Flexeril & Zanaflex but the HA persisted; when she called [name]'s office she was directed to call us f[place] an appt because of the BP... NOTE> it has been determined by her Cardiologist DrNishan & I concur that in her case it's the HA PAIN that causes her BP to spike, and relief of the pain is paramount to controlling the BP in these instances...  In this regard she is directed to maintain a baseline BP regimen w/ Metoprolol50Tid, Apres50-1/2Tid & Losar100-1/2Tid;  But when she has a HA she will immediately start her  Neurology action plan for her severe headaches, and check her BP Tid w/ adjustment of her meds up to one of each tab Tid until the HA is resolved, then return to her baseline regimen... We reviewed this plan in detail & she will let us know if she has any questions...   CXR 3/16 showed norm heart size, s/p CABG, clear lungs, NAD.Marland KitchenMarland Kitchen   EKG 3/16 showed SBrady, rate53, NSSTTWA, NAD... PLAN>> continue baseline BP med regimen w/ METOPROLOL50Tid, APRESOLINE50-1/2Tid & LOSARTAN100-1/2Tid; continue to monitor BP at home; during episodes of severe HA pain- follow Neurologists HA action plan for control of the discomfort; in addition start to monitor BP 3 times daily & increase BP meds to 1 of each Tid until the HA is controlled & the BP returns to normal; then resume the baseline regimen...  ~  August 16, 2014:  3-337moROV & [name] Dinhas continued w/ her regular array of medical specialist follow up visits> SEE BELOW    Asthma & allergies controlled on Allegra & Nasonex, no asthma attacks etc...    BP is doing better on current Metop50Tid, Losar100-1/2Tid, Apres50-1/2Tid w/o any elev readings at home...     She has had several follow up visits w/ GI> hx GERD, Divertics, IBS, Polyps> last bout of diverticulitis was 12/15 treated w/ Cipro & Flagyl; she recurrent pain 3/16- seen in  ER where CT was neg but she improved w/ Antibiotic rx; she continues on Omep & they added Ranitadine; she's been on Levsin & they added benefiber; she had Ba swallow (neg), EGD (wnl) & Colon (divertics and 2 sessile polyps) by DrStark...     She saw her oncologist, DrMagrinat 6/16 for f/u of her breast cancer> Dx 10/12 & treated w/ lumpectomy & sentinel node bx, followed by XRT, the Femara for a planned 589yr(2 more yrs to go); they follow her BMDs etc...     She has a small cyst on her right index finger & I have suggested an Ortho/Hand eval, "I might want to see Rheumatology again" she says...    She's had an extensive Neuro eval from [name] & had a [facility]rology 2[facility] [place]-  "it's my neck" and she states nothing new was discovered "they didn't find anything"; she has Tramadol & Tylenol for pain... We reviewed prob list, meds, xrays and labs> see below for updates >>   Barium Swallow was neg- norm motility, no HH, etc...  EGD 5/16 by DrStark showed essentially normal endoscopy; rec to continue antireflux regimen & PPI Rx...  Colonoscopy 5/16 by DrStark showed diverticulosis, 2 sessile polyps (one tub adenoma, the other hyperplastic)...  LABS 6/16: Chems- ok x BS=147;  CBC- wnl;   PLAN>>  She is rec to continue her current med regimen and follow up w/ her specialists...  ~  December 20, 2014:  37m86moV & [name] Dindicates that she is the same, maintaining her regular f/u visits w/ various medical specialists>    Asthma & allergies controlled on Allegra & Nasonex, no asthma attacks etc & breathing is OK, she's been walking for exercise...    BP is doing better on current Metop50Tid, Losar100-1/2Tid, Apres50-1/2Tid w/o any elev readings at home; she saw DrNishan for Cards 8/16> HBP, CAD, CABG 2011, HL; nonischemic myoview2015, BP controlled, intol to statins.     She has had several follow up visits w/ GI> hx GERD, Divertics, IBS, Polyps> on Omep40,  Zantac300, Anaspaz0.125 prn; last bout of  diverticulitis was 12/15 treated w/ Cipro & Flagyl; she had recurrent pain 3/16- seen in ER where CT was neg but she improved w/ Antibiotic rx; she had Ba swallow (neg), EGD (wnl) & Colon (divertics and 2 sessile polyps) by DrStark...     She saw her oncologist, DrMagrinat 6/16 for f/u of her breast cancer> Dx 10/12 & treated w/ lumpectomy & sentinel node bx, followed by XRT, the Femara for a planned 50yr (2 more yrs to go); they follow her BMDs etc...     She had routine GYN check by PatGrubb 7/16> note reviewed...    She has a small cyst on her right index finger & I have suggested an Ortho/Hand eval, "I might want to see Rheumatology again" she says...    She's had an extensive Neuro eval from [name] & had a Neurology 2nd [facility]16 [facility] [place]-  "it's my neck" and she states nothing new was discovered "they didn't find anything"; she has Tramadol & Tylenol for pain=> f/u [name] 8/16 & offered occipital nerve blocks; she [place]> note reviewed. Chr daily HAs (occip neuralgia vs cervicogenic HAs), rec occip nerve blocks & PT, Gabapentin and f/u planned.    She also saw DrAthar for a Sleep Eval split night study 04/25/14 w/ AHI=16/hr w/ desat to 85% reported; she was titrated to CPAP 6cm H2O w/ AHI=0 and O2sat nadir=96%, snoring was eliminated; download from 8/16 looked good but she reported some mask issues which they are working on; she notes feeling sl better on the days that she wears the CPAP but HAs are about the same... We reviewed prob list, meds, xrays and labs> see below for updates >> given 2016 FLU vaccine today...  Vit D level 7/16 reported at 29 & she is rec to increase Vit D supplement to ~2000u daily...  [facility]  Lowest Tscore = -1.8 in TotalSpine and -[facility].6 in [facility].. IMP/PLAN>>  [name] stable on her current regimen; multisystem disease w/ numerous  specialists tending to her needs and their systems; she has had several opinions from Neurologists regarding her chr daily HAs and cervicogenic HAs, she will f/u w/ DrTat regarding treatment options...   ~  June 20, 2015:  671moOV & [name] Dinontinues to have mult somatic [name]- weakness, not feeling well, chest pressure, cough w/ thick white sput, occas wheezing noted, and increased anxiety/jittery (esp w/ the Ventolin prescribed by drVanWinkle);  She states that her BP has been low & therefore she doesn't take all of her meds everyday, but this makes it hard for usKoreao adjust Rx since she does not keep detailed records of BP & wheat she is taking & when... We reviewed her interval specialist visits>>    Allergy- DrVanwinkle 04/27/15>  AR, asthma, GERD- on immunotherapy for >15657yrAllegra180, flonase, Nasonex, Saline, VentolinHFA, Omeprazole; FENO=27ppb & pt rec to f/u 57yr34yr   Cards- DrNishan 03/10/15>  HBP, CAD, CABG 2011, HL (intol to statins & refuses Rx);  She was Adm over halloween w/ hypertensive emergency (SBP>220) and hyponatremia (Na=119), HCT was stopped & she was suspected of taking pseudophed, Myoview & Echo were neg, mult med adjustments over that time-  Appears stable on ASA81, Metop50Tid, Amlod5, Losar100, Apres50-1/2Tid; BP today= 140/70 & she is doing better overall...     GI- JZehr 05/16/15>  Abd pain, heartburn, bloating, diarrhea; felt to have functional pain & IBS; EGD 5/16  was wnl; Colon 5/16 w/ mod divertics, & tub adenoma removed; on Omep40 (changed to Protonix40), Zantac300, rec to add Probiotic daily & use Levsin prn...    She remains on Femara 2.72m/d from DrMagrinat- last seen 07/2014 7 note reviewed...    Rheum eval 2014 by DKathlynn Grate neck pain w/ NS consult DrHirsh as well- MRI CSpine showed multilevel DDD & some foraminal stenosis, not a surg cand; asked to f/u prn...    Outpt Rehab- mult visits (11) and disch 05/10/15>  Neck pain, musc spasm, decr ROM Cspine, abn posture; goals  met & pt improved...    Neuro- DrJaffe 02/28/15>  Occipital neuralgia, given occipital nerve block w/ Maracaine & Kenalog...     DrAthar 04/26/15>  OSA on CPAP- good compliance documented, AHI on CPAP<1 on CPAP 6 w/ min leak; she [place] supplies from [place]..  DrAquino 05/24/15>  F/u chr daily HAs, pt says occip nerve blocks w/o benefit, but she reports that PT helped (esp dry needling) & rec to continue home exercise program. EXAM shows Afeb, VSS, O2sat=96% on RA;  HEENT- neg, mallampati2;  Chest- clear w/o w/r/r;  Heart- RR Gr1/6SEM w/o r/g;  Abd- soft, nontender, norm BS, neg;  Ext- w/o c/c/e;  Neuro- sl decr ROM neck, +trigger pts, otherw neg.  CXR 06/20/15>  Borderline heart size, s/p CABGsl hyperinflation, clear lungs- NAD...  Spirometry 06/20/15>  FVC=1.78 (90%), FEV1=1.26 (84%), %1sec=71%, mid-flows were sl reduced at 59% predicted...   LABS 06/20/15>  Chems- wnl;  CBC- wnl;  TSH=1.30... IMP/PLAN>>  [name] well attended by her mult specialists and is stable to improved overall;  She is rec to continue her current meds, get plenty of exercise, and avoid stress... we plan recheck in 625mo.  ~  December 20, 2015:  44m58moV & [name] that her BP drops on occas down to 110/50 w/ pulse in the 50's & she will adjust her own meds if this occurs; regular meds include Amlodipine5, Metop50Tid, Cozaar100-1/2Tid, Hydralazine50-1/2Tid & she likes it this way so she can freq check BP 7 adjust as noted which empowers her sense of control over her health status; she lists MULT allergies/ drug sensitivities (12+listed)  including Hct (developed hyponatremia) & ACE inhibitors;  BP today is 140/76 & we rec continue same regimen...    She continues to see Allegy- DrVanWinkle yearly, seen 04/2015 w/ mild intermit asthma, AR, GERD;  FeNO=27, rec to continue the rescue inhaler as needed, Allegra, flonase, saline & Omeprazole; he thanked me for allowing him to participate in her care...     She had CARDS f/u 10/11/15 by  DrNishan> HBP, Hx CAD, s/p CABG 2011, HL; non-ischemic myview in 2015- continue same meds...    She was recently seen in the ER 12/08/15 for Abd pain- epig area, awoke her at 4am w/ burning pain, sl nausea, hx gastritis, norm CT Abd in 2016;  Exam reported as neg- nontender;  Labs were OK;  EKG showed NSSTTWA;  Abd Sonar was normal as well;  She was given Zofran, MS, and Pepcid- improved;  Disch on her PPI rx, Carafate Qid, and Percocet5 prn...    She had a yearly GYN eval by DrAmundson & [name] Circle6/17- note reviewed, hx urethral prolapse, & rec to take spectrum Organic coconut oil EXAM shows Afeb, VSS, O2sat=97% on RA;  HEENT- neg, mallampati2;  Chest- clear w/o w/r/r;  Heart- RR Gr1/6SEM w/o r/g;  Abd- soft, nontender, norm BS, neg;  Ext- w/o c/c/e;  Neuro-  sl decr ROM neck, +trigger pts, otherw neg.  LABS 11/2015 in epic reviewed>  Chems- wnl, BS=112;  CBC- wnl IMP/PLAN>>  Mult medical problems as listed- BP is contolled on her regimen and she monitors her BP regularly;  Cardiac stable & we discussed exercise program;  Cryptic abd discomfort, ER eval neg, no recurrence... Given Flu shot today & we plan ROV 71mo  ~  June 19, 2016:  663moV & [name] indicates "doing pretty good";  She has not had any resp exacerbations & hasn't had to use her rescue inhaler, breathing well but she notes occas cough, chunks of sput, DOE w/ walking; she denies CP/palpit/ edema/ etc... We reviewed the following medical problems during today's office visit >>     AR/ Asthma>  On Albut-HFA as needed & Allegra180/ Nasonex; denies cough, sput, ch in dyspnea, etc; so far no allergy or resp exacerbation...    OSA on CPAP per DrAthar>  Sleep study 03/2014 w/ AHI=16, nadir O2sat=85%, & PLMS=62/hr but min arousals; placed on CPAP & titrated to [facility]/ resolution of snoring & hypoxemia,   CPAP download 03/2016 w/ excellent compliance w/ CPAP=6, low leak & AHI=0.5/hr...    HBP>  on Metop50Tid, Amlod5, Losar100-1/2Tid,  Apres50-1/2Tid; BP=120/62 today; denies visual changes, CP, palipit, dizziness, syncope, dyspnea, edema, etc; she freq adjusts meds on her own based on home BP readings...     CAD>  On ASA81 + above meds; CP 11/11=> cath w/ in-stent restenosis, then CABG x3 by DrGearhardt; re-hospitalized 6/12 by Cards for CP> cath showed severe 3 vessel CAD w/ 3/3 grafts patent & norm LVF w/ EF= 65-70%; Myoview 5/13 showed no ischemia & EF>80%; Imdur30 stopped for HAs;  DrNishan noted that med rx is lim by side effects- Last seen 03/2016 doing satis & no changes made...    Hyperlipid>  Intol to statins; [facility] her best FLP readings on Cres10-M&Th but she eventually stopped this too due to perceived side effects; on diet alone FLP 06/2016 shows TChol 230, TG 286, HDL 53, LDL 140 and she is asked to again reconsider a very very low dose of Crestor or perhaps a non-statin medication alternative...    GI- GERD, "hypercontractile esoph", Divertics, IBS> prev followed by DrPatterson & seen 9/13 f/u for ?diverticulitis- improved after Cipro/Flagyl but CTAbd was neg x diverticulosis; he rec high fiber diet & Citrucel; now followed by DrStark on Protonix40, Zantac300 & Carafate-1gmQid; he rec IBgard medical food for her IBS and FDgard for her functional dyspepsia    Hx left breast cancer> she had f/u Oncology-DrMagrinat, last seen 11/2015 for her estrogen receptor positive breast cancer; she inished [employer] in [id number]& discharge from Oncology f/u;  [facility] as well...     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 EXAM shows Afeb, VSS, O2sat=95% on RA;  HEENT- neg, mallampati2;  Chest-  clear w/o w/r/r;  Heart- RR Gr1/6SEM w/o r/g;  Abd- soft, nontender, norm BS, neg;  Ext- w/o c/c/e;  Neuro- sl decr ROM neck, +trigger pts, otherw neg.  LABS 05/2016>  FLP- not at goals on diet alone (see above);  Chems- wnl,  Na=134;  CBC- wnl;  TSH=2.10;  VitD=40 & rec to take OTC VitD supplement ~2000u daily... IMP/PLAN>>  [name] is overall stable on her current regimen;  She will continue same meds, continue nightly CPAP, continue to exercise daily, etc... She will maintain f/u w/ DrStark for GI, and DrAquino for Neuro;  She will reconsider getting on a med for her Chol- offered very very low dose Crestor vs a non-statin & she will think about it & let me know, in the meanwhile we reviewed diet & exercise... we plan ROV in 34mo  ~  December 19, 2016:  663moOV & [name] c/o some sinus issues that started up w/ the colder weather, she needs a Flu shot today, and requests referral to Podiatry for ingrown toenail;  She has been getting phys therapy per DrMagrinat for left arm swelling/ lymphedema... We reviewed the following interval medical visits in Epic>      She had outpt rehab per DrMagrinat>  Mult PT visits reviewed- weakness & lymphedema, improved and now independent w/ home exercise program...    She saw CARDS- DrNishan 11/14/16>  Hx HBP, HL (intol to statins & refuses med rx), CAD- s/pCABG 2011; she had nonischemic myoview 12/2013 and norm EF by Echo; mult medical problems- she says "spasms" cause CP, hx esoph dysmotility & dysphagia; he rec same meds and f/u 1y28yr  We reviewed the following medical problems during today's office visit>  NOTE: she has 13 listed "allergies" on her allergy list...    AR/ Asthma>  On Albut-HFA as needed & Allegra180/ Nasonex; denies cough, sput, ch in dyspnea, etc; so far no allergy or resp exacerbation...    OSA on CPAP per DrAthar>  Sleep study 03/2014 w/ AHI=16, nadir O2sat=85%, & PLMS=62/hr but min arousals; placed on CPAP & titrated to [place] resolution of snoring &  hypoxemia,   CPAP download 03/2016 w/ excellent compliance w/ CPAP=6, low leak & AHI=0.5/hr; she continues to use the CPAP nightly...    HBP>  on Metop50Tid, Amlod5, Losar100-1/2Tid, Apres50-1/2Tid; BP=150/68 today; denies visual changes, CP, palipit, dizziness, syncope, dyspnea, edema, etc; she freq adjusts meds on her own based on home BP readings...     CAD>  On ASA81 + above meds; CP 11/11=> cath w/ in-stent restenosis, then CABG x3 by DrGearhardt; re-hospitalized 6/12 by Cards for CP> cath showed severe 3 vessel CAD w/ 3/3 grafts patent & norm LVF w/ EF= 65-70%; Myoview 5/13 showed no ischemia & EF>80%; Imdur30 stopped for HAs;  DrNishan noted that med rx is lim by side effects- Last seen 03/2016 doing satis & no changes made...    Hyperlipid>  Intol to statins; [facility] her best FLP readings on intermittent Cres10-M&Th but she eventually stopped this too due to perceived side effects; on diet alone FLP 06/2016 shows TChol 230, TG 286, HDL 53, LDL 140 and she agreed to try Cres5-MWF w/ FLP 12/27/16 showing TChol 151, TG 173, HDL 55, LDL 61 => rec to continue same...    GI- GERD, "hypercontractile esoph", Divertics, IBS> prev followed by DrPatterson & seen 9/13 f/u for ?diverticulitis- improved after Cipro/Flagyl but CTAbd was neg x diverticulosis; he rec high fiber diet & Citrucel; now followed by DrStark on Protonix40, Zantac300 & Carafate-1gmQid; he rec IBgard medical food for her IBS and FDgard for her functional dyspepsia    Hx left breast cancer> she had f/u Oncology-DrMagrinat, last seen 11/2015 for her estrogen receptor positive breast cancer; she finished [employer]  in [id number] (69yr) & discharge from Oncology f/u;  [facility]...     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 EXAM shows Afeb, VSS, O2sat=99% on RA;  HEENT- neg, mallampati2;  Chest- clear w/o w/r/r;  Heart- RR Gr1/6SEM w/o r/g;  Abd- soft, nontender, norm BS, neg;  Ext- w/o c/c/e;  Neuro- sl decr ROM neck, +trigger pts, otherw neg.  LABS 12/27/2016>  FLP- improved on Cres5-MWF;  LFTs- wnl... IMP/PLAN>>  [name] stable w/ her multisystem dis- rec to continue current meds + diet & exercise; she is going to inquire about new [facility] given 2018 Flu vaccine today...    ~  June 19, 2017:  671moOV & [name] Dineports a good interval- no new [name] or concerns... We reviewed the following interval medical visits in Epic>    She saw GYN- DebLeonard on 01/14/17>  Annual exam, no problems identified x some dryness/ atrophic vaginitis/ small cystocele & using coconut oil; hx breast cancer & up to date...    She saw ALLERGY- DrVanWinkle on 05/13/17>  Hx AR, asthma, & GERD; on allergy shots since 2000, persistent nasal congestion/ drainage/ etc; on AlbutHFA prn, Allegra180, & antireflux regimenshe requested to continue immunotherapy as well.    She saw NEURO Sleep- DrAthar on 05/21/17>  Dx w/ OSA in 2016 w/ AHI=16.1 & started on CPAP 6cm w/ good efficacy, download showed excellent compliance & resid AHI<1; no changes made...     She saw NEURO- DrAquino on 06/03/17>  Hx HAs and neck pain, prev occip nerve block w/o much benefit, improved after PT & dry needling, mult somatic [name] noted; uses Tramadol/ Tylenol prn; she was c/o some numbness in right hand & foot- offered to do EMG/NCV vs atart Gabapentin or Lyrica as trial but she declined both & will watch her symptoms, call if worsening... We reviewed the following medical problems during today's office visit>  NOTE: she has 13 listed "allergies" on her allergy list...    AR/  Asthma>  On Albut-HFA as needed & Allegra180/ Nasonex; denies cough, sput, ch in dyspnea, etc; so far no allergy or resp exacerbation...    OSA on CPAP per DrAthar>  Sleep study 03/2014 w/ AHI=16, nadir O2sat=85%, & PLMS=62/hr but min arousals; placed on CPAP & titrated to 6cDauphin/ resolution of snoring & hypoxemia, CPAP download 04/2017 w/ excellent compliance w/ CPAP=6, low leak & AHI=0.5/hr; she continues to use the CPAP nightly...    HBP>  on Metop50- 1/2Tid, Amlod5, Losar100-1/2Tid, Apres50-1/2Tid; BP=132/62 today; denies visual changes, CP, palipit, dizziness, syncope, dyspnea, edema, etc; she freq adjusts meds on her own based on home BP readings...     CAD>  On ASA81 + above meds; CP 12/2009=> cath w/ in-stent restenosis, then CABG x3 by DrGearhardt; re-hospitalized 6/12 by [facility] for CP> cath showed severe 3 vessel CAD w/ 3/3 grafts patent & norm LVF w/ EF= 65-70%; Myoview 5/13 showed no ischemia & EF>80%; Imdur30 stopped for HAs;  DrNishan noted that med rx is lim by side effects- Last seen 10/2016 doing satis & no changes made...Marland KitchenMarland Kitchen  Hyperlipid>  Intol to statins; prev followed in the [facility] w/ her best FLP readings on intermittent Cres10-M&Th but she eventually stopped this too due to perceived side effects; on diet alone FLP 06/2016 shows TChol 230, TG 286, HDL 53, LDL 140 and she agreed to try Cres5-MWF w/ FLP 12/27/16 showing TChol 151, TG 173, HDL 55, LDL 61 => rec to continue same; tol this regimen satis w/ FLP 05/2017 showing TChol 193, TG 192, HDL 55, LDL 100 => discussed low chol/fat diet     GI- GERD, "hypercontractile esoph", Divertics, IBS> prev followed by DrPatterson & seen 9/13 f/u for ?diverticulitis- improved after Cipro/Flagyl but CTAbd was neg x diverticulosis; he rec high fiber diet & Citrucel; now followed by DrStark on Protonix40, Zantac300 & Carafate-1gmQid; he rec IBgard medical food for her IBS and FDgard for her functional dyspepsia    Hx left breast cancer> she had f/u  Oncology-DrMagrinat, last seen 11/2015 for her estrogen receptor positive breast cancer; she finished Letravole in [id number] (42yr) & discharge from Oncology f/u;  [facility]...     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 EXAM shows Afeb, VSS, O2sat=97% on RA;  HEENT- neg, mallampati2;  Chest- clear w/o w/r/r;  Heart- RR Gr1/6SEM w/o r/g;  Abd- soft, nontender, norm BS, neg;  Ext- w/o c/c/e;  Neuro- sl decr ROM neck, +trigger pts, otherw neg.  CXR 06/20/27 (independently reviewed by me in the PACS system) showed mild ardiomeg, s/p CABG, clear lungs, NAD...  LABS 06/19/17>  FLP- ok on Cres5MWF x TG=192;  chems- ok w/ K=4.7, BS=94, Cr=0.66, LFTs=wnl;  CBC- ok w/ Hg=13.5, WBC=6.3K;  TSH=1.26 IMP/PLAN>>  [name] stable w/ her mult medical issues as documented above;  Asked to continue same meds and therapies- continue f/u w/ her various medical specialists;  We discussed my planned retirement at th end of 2019 & the need to find another primary care physician- [facility] and [facility] w/ [facility] practitioners;  DrVanWinkle can refill her prn Albuteral inhaler & we will not set her up for f/u w/ [facility], and she unnderstands that she can call at any time for help w/ her breathing should the need arise...           Problem List:  GLAUCOMA (ICD-365.9) - she is sched for right cataract surg by DrBevis in Feb...  ALLERGIC RHINITIS (ICD-477.9) - she uses Allegra & Nasonex Prn; on Allergy shots weekly x yrs... ~  [facility]=> she  remains on allergy shots...  ASTHMA & Asthmatic Bronchitis >> off Symbicort & using XOPENEX HFA as needed rescue inhaler... ~  5/11:  notes incr chest symptoms in the heat & rec to take the Symbicort 2sp Bid... ~  2012:  After her CABG & repeat hosp by [facility] she is noted to be off her inhalers... ~  10/12:  Treated w/ Zpak, Pred, Mucinex, plus her Symbicort80 & improved... ~  CXR 5/13 showed s/p CABG, clear lungs, NAD (she went to the Er w/ cough & had  neg eval). ~  7/13:  DrVanWinkle stopped her Symbicort & switched to [facility] HFA as needed... ~  2/14: she does not list any inhaled meds at present & states breathing OK w/o exac... ~  4/14: she was hosp after pill asp w/ asthma exac & improved w/ supportive Rx, didn't require bronch, CXR was clear, grad improved post hosp... ~  CXR 4/14 showed normal heart size, s/p CABG, sl tort Ao, bilat apic pleural scarring, NAD...  ~  6/15: she is stable w/o asthma exac and has avoided URIs/ bronchitis/ etc... ~  10/15: presented w/ URI/ bronchitic exac w/ cough, yellow-green sput, congestion, hoarseness, etc; CXR w/o signs of pneumonia & treated w/ Depo, Levaquin, Mucinex, MMW, Delsym, etc...  ~  CXR 10/15 showed few chr changes in lungs, norm heart size & s/p CABG, DJD in spine, NAD  HYPERTENSION (ICD-401.9) - controlled on TOPROL 53mBid & HYZAAR 100-12.5 daily (off Lisinopril due to ACE cough)...  ~  8/12: BP=  128/84 & tol meds well; denies HA, fatigue, visual changes, CP, palipit, syncope, dyspnea, edema, etc... ~  2/13: BP= 140/64 & she remains essentially asymptomatic on Rx... ~  8/13:  BP= 132/64 & she denies CP, palpit, SOB, edema, etc... ~  2/14: controlled on Toprol & Hyzaar w/ BP 144/72 today; denies visual changes, CP, palipit, dizziness, syncope, dyspnea, edema, etc... ~  5/14:  BP= 136/60 & she remains stable... ~  8/14:  BP= 134/60 and she denies CP, palpit, dizzy, SOB, edema... ~  12/14: on Metop50Bid, Hyzaar 100-12.5, Imdur30;  BP=  140/80 & she remains largely asymptomatic... ~  6/15: on ASA81, Metop50Bid, Hyzaar100-12.5, Imdur30; BP today= 136/68 & she denies CP, palpit, SOB, dizzy, edema, etc... ~  10/15: on same meds w/ BP= 160/78 & no changes meds- advised to monitor BP at home once she's over the URI illness, take meds regualrly... ~  11/15: post Hosp for HBP, hyponatremia; they stopped diuretic & disch on Metop50Bid & Losar50; she went back to ER the next day w/ BP~200 (hadn't taken her Losartan) & given IV Hydralazine w/ improvement to 160s; we decided to continue the BBlocker, increase the ARB to 1032md, and ADD HYDRALAZINE50- 1/2Bid for now; ROV 2 weeks... ~  11/15: she is s/p ER, Hosp, [facility] visits & mult phone calls (see above)> meds ajusted- take Metop50Tid, Losartan100-1/2Tid, Apresoline50- 1/2 to 1tab Tid (she wants to do this since BP is low in AM & she feels weak, with BP elev in PM & she needs more)... ~  2/16: BP meds adjusted over the last few months & BP controlled on Metoprolol50Tid, Losar100-1/2Tid, Apres50-1/2Tid; BP today= 136/80, improved & she denies recent CP, palpit, dizzy, ch in SOB, edema... ~  3/16: pt presented w/ BP elev during recurrent bouts of HA pain- asked to increase her above meds to Metop50/ Losar100/ Apres50- at her Tid dosing intervals when needed for HBP while her HA meds are kicking in to resolve her pain; if her pain doesn't resolve & her BP does not improve, then she knows to go to the ER for immediate assessment...   CORONARY ARTERY DISEASE (ICD-414.00) - on ASA 8117m (intol to 325 she says) & off Plavix per DrNCherly Hensen  Cardiolite 9/08 showed CP & HBP response, EF+85%... had cath 10/08 w/ single vessel dis w/ mod ostial LAD stenosis and patent LAD stent, normal LVF... Marland Kitchenption for med Rx vs off pump LIMA... she notes some CP/ pressure "burning" when walking up a certain hill during her  daily walks, but states this is stable and "no worse"... ~  Myoview 9/09 was neg- no scar or ischemia,  EF= 89%, hypertensive response- Lisinopril10 added. ~  f/u Myoview 3/11 was neg- no scar or ischemia, EF= 86%, norm BP response, fair exerc capacity. ~  saw [name] 5/11- no CP, he stopped her Plavix, stable- continue other meds same. ~  She had recurrent CP 11/11 w/ in-stent restenosis on cath> subseq 3 vessel CABG by DrGearhart & doing well since then... ~  6/12:  Hosp w/ CP & repeat cath showed CAD & 3/3 grafts patent, EF= 65-70% ~  Myoview 5/13 was felt to be a normal stress nuclear study- normal wall motion, no ischemia, EF=84% ~  2/14: CP 11/11=> cath w/ in-stent restenosis, then CABG x3 by DrGearhardt; re-hospitalized 6/12 by [facility] for CP> cath showed severe 3 vessel CAD w/ 3/3 grafts patent & norm LVF w/ EF= 65-70%; Myoview 5/13 showed no ischemia & EF>80%; no change in meds; & [name] noted that med rx is limited by side effects... ~  8/14 & 12/14: she remains stable on Imdur30 + Metop50Bid 7 Hyzaar100-12.5; denies CP, palpit, SOB, etc... ~  6/15: [facility] f/u w/ DrNishan 1/15> HBP, CAD, HL; on ASA81, Metop50Bid, Hyzaar100-12.5, Imdur30; Hx neg Myoview 5/13 & doing satis- no changes made; BP today= 136/68 & she denies CP, palpit, SOB, dizzy, edema, etc ~  EKG 11/15 showed NSR, rate69, NSSTTWA, NAD  ~  2DEcho 11/15 showed  norm LVF w/ EF=60-65% & no regional wall motion abn, trivial AI, otherw wnl. ~  Myoview 11/15 showed low risk scan w/ abn EKG portion but no stress induced perfusion defects, hyperdynamic LVF w/ EF>70% & no regional wall motion abn. ~  11/15: she is off the Imdur per DrNishan due to HAs... ~  1/16: she had f/u DrNishan> stable on meds and no changes made...  HYPERLIPIDEMIA (ICD-272.4) - on CRESTOR 46m- 1/2 on M&F, FISH OIL 12040mTid + Flax Seed Oil... she has been intol to statins in the past>  tried low dose Trilipix but stopped this due to "thigh burning">  now followed [place] [facility]--- ~  [place] showed TChol 217, TG 236, HDL 46, LDL 150...  ~  [place] showed  TChol 233, TG 263, HDL 46, LDL 150... she agrees to try LOW DOSE Trilipix. ~  FLP 11/09 showed TChol 162, TG 167, HDL 45, LDL 83... ~  [place]/10 showed TChol 240, TG 271, HDL 45, LDL 150... LipClin Rx w/ FishOil & FlaxSeedOil. ~  FLP 2/11 showed TChol 285, TG 156, HDL 79, LDL 180... LC is aware & working w/ her regularly. ~  [place]11 showed TChol 277, TG 241, HDL55, LDL 193...  LC added Cres10 just 2d per week. ~  FLP 10/11 showed TChol 198, TG 215, HDL 53, LDL 109... much improved on Cres10 twice weekly. ~  She continues to f/u w/ Lipid clinic & they titrated up the Cres10 on M&F, plus 66m24mn Wednes> ~  FLP  10/12 was the best yet w/ TChol 167, TG 203, HDL 54, LDL 88...  ~  [place]13 showed TChol 193, TG 176, HDL 57, LDL 101 ~  [facility] (at that time on Cres10 on M&F, 66mg1m W) but she subseq cut herself down to 1/2 on M&F due to nausea & aching. ~  FLP 2/14 on diet alone showed TChol 194, TG 143, HDL 55, LDL  110... We reviewed diet, exercise, etc... ~  She remains off the [place] on diet alone and she declines to restart low dose rx "I can't tolerate them"  GERD (ICD-530.81) - on OMEPRAZOLE 48m/d... last EGD by DrPatterson 8/07 revealed sm HH, & gastritis and dilatation done... (RUT=neg, PPI Rx)... LEVSIN 0.1259mhelps her esoph spasm... Prn Phenergan for nausea.  DIVERTICULOSIS, COLON (ICD-562.10) & IRRITABLE BOWEL SYNDROME (ICD-564.1) ~  colonoscopy 2/03 by DrPatterson showed divertics and 5 mm polyp... ~  colonoscopy 3/11 showed divertics & cecal polyp= tubular adenoma, w/ f/u planned 5y28yr~  9/13: seen by DrPatterson f/u for ?diverticulitis- improved after Cipro/Flagyl but CTAbd was neg x diverticulosis; he rec high fiber diet & Citrucel...  ? GALLSTONES>>  Diagnosed by DrPatterson and referred to [facility] DrWilson, pt asked to call prn fopr incr symptoms to consider surg... ~  Abd Ultrasound 10/13 by DrNCherly Hensenr eval CP showed> norm GB, liver, kidneys,  & Ao- neg sonar.  BREAST CANCER >> Abn mammogram 10/12 showing a left breast nodule & subseq surg (partial mastectomy & sentinel node bx) by [name] 12/12 that proved to be an invasive ductal carcinoma, 2.3cm size, w/ neg lymphovasc invasion, clear margins (but close at 1mm60m& neg nodes; she had XRT by DrKindard (had her 11th treatment today); and Oncology eval by DrMagrinat who plans hormone Rx later (no chemo she says)...  ~  2/13: She is tolerating treatment well & spirits are up, [name] had to aspirate a seroma & it is now resolved... ~  She is followed by DrMagrinat, StreMargot Chimesnard- [facility]/ 56yrs 4yrned... ~  6/14:  She had f/u DrMagrinat 6/14> his note is reviewed- s/p lumpectomy, sentinel node, XRT; plan is to continue Femara2.5mg th61m3/16 for 56yrs rx35yr 10/14:  She had routine f/u [name] 10/14> Hx left breast cancer dx 10/12 & treated w/ lumpectomy, XRT, & now Femara; exam neg, f/u mammogram neg, rec f/u 35yr. ~  22yr:  She saw Oncology for f/u of her left Breast Cancer> on Femara2.5 & tol well, s/p lumpectomy & sentinel node bx 12/12, followed by XRT finished 3/13, & on Letrozole since then, no known recurrence...   DJD, Neck Pain >> ~  XRays Cspine 4/14 in hosp showed multilevel cerv spondylosis, 1-2mm anter13msthesis C4 on C5, DDD, foraminal stenosis at C5-6...  ~  5/14:  MRI Cspine => multilevel facet hypertrophy, foraminal narrowing & stenosis, disc osteophyte complexes, some central canal stenosis=> she was sent to NS, DrHirsh who rec Mobic & PT (helped alittle) but she was not pleased... ~  8/14:  She is requesting a Rheumatology 2nd opinion about her neck discomfort=> [facility] this exac HAs/migraines, dizzy/ light headed; MRI=> NS consult & not a surg cand & NSAIDs/ PT w/ min benefit; saw Neurology- tried Pamelor but feels hung-over; for her DDD & OA they rec Flex5mgTid whi72mhas helped some...  FIBROMYALGIA (ICD-729.1) - she c/o chr fatigue,  aching/ sore, on Tramadol & Tylenol, but most benefit from low dose Hydrocodone ~1/2 tab Prn... I have recommended an increase exercise program to her... ~  5/11:  Vit D level = 50 on 50000 u weekly by Patti GrubbHelene ShoeCHE (ICD-784.0) [facility], but she notes Rx didn't help... ~  3/11:  presented w/ muscle contraction HA's> Rx Tramadol 50mg Q6H Pr58m  She has persistent/ recurrent HAs... ~  11/15: her chronic daily HAs have again risen in  improtance due to interaction w/ her HBP> asked to f/u w/ Neuro ASAP & Rx w/ rest, heating pad, Tramadol50/ Tylenol, and musc relaxer- Zanaflex2Tid vs Flexeril5Tid...  DIZZINESS, CHRONIC (ICD-780.4) - MRI 2/09 per [name] showed atrophy, sm vessel dis, NAD...  Health Maintenance: ~  GI:  DrPatterson & up to date on colon screening. ~  GYN:  yearly f/u w/ [name] Shoe, NP for PAP, Mamm[facility], ?last BMD. ~  Immunizations:  yearly Flu vaccines in fall, Pneumovax in 2007?, TDAP given 5/11...   Past Surgical History:  Procedure Laterality Date  . [place] STUDY N/A 01/08/2016   Procedure: [place] STUDY;  Surgeon: Ladene Artist, MD;  Location: [facility];  Service: Endoscopy;  Laterality: N/A;  . BREAST LUMPECTOMY Left 02/06/11  . CATARACT EXTRACTION, BILATERAL     bilateral caaract removal,  . CORONARY ANGIOPLASTY WITH STENT PLACEMENT     Stent 2007  . CORONARY ARTERY BYPASS GRAFT    . ESOPHAGEAL MANOMETRY N/A 01/08/2016   Procedure: ESOPHAGEAL MANOMETRY (EM);  Surgeon: Ladene Artist, MD;  Location: [facility];  Service: Endoscopy;  Laterality: N/A;  . open heart surgery     01/19/2010    Outpatient Encounter Medications as of 06/19/2017  Medication Sig  . Acetaminophen (TYLENOL 8 HOUR PO) Take by mouth as directed.   [name] albuterol (VENTOLIN HFA) 108 (90 Base) MCG/ACT inhaler Inhale 2 puffs into the lungs every 6 (six) hours as needed for wheezing or shortness of breath.  [name] amLODipine (NORVASC) 5  MG tablet Take 1 tablet (5 mg total) by mouth daily.  [name] aspirin EC 81 MG tablet Take 81 mg by mouth daily.  . Calcium & Magnesium Carbonates (MYLANTA PO) Take by mouth. 1-2 teaspoons as needed  . CARAFATE 1 GM/10ML suspension take 10 milliliters by mouth four times a day with meals and at bedtime  . fexofenadine (ALLEGRA) 180 MG tablet Take 180 mg by mouth daily.    . fluocinonide (LIDEX) 0.05 % external solution Apply 1 application topically daily as needed (scalp itching).   . hydrALAZINE (APRESOLINE) 50 MG tablet take 1/2 tablet by mouth three times a day  . ketoconazole (NIZORAL) 2 % shampoo Apply 1 application topically 2 (two) times a week. As needed for itching a couple of times week  . latanoprost (XALATAN) 0.005 % ophthalmic solution Place 1 drop into both eyes at bedtime.   [name] losartan (COZAAR) 100 MG tablet take 1/2 tablet by mouth three times a day  . magnesium oxide (MAG-OX) 400 MG tablet Take 400 mg by mouth daily. Reported on 02/14/2015  . metoprolol tartrate (LOPRESSOR) 50 MG tablet take 1 tablet by mouth three times a day  . mometasone (NASONEX) 50 MCG/ACT nasal spray Place 2 sprays into the nose daily as needed (congestion).   . nitroGLYCERIN (NITROSTAT) 0.4 MG SL tablet Place 1 tablet (0.4 mg total) under the tongue every 5 (five) minutes as needed for chest pain.  . Omega-3 Fatty Acids (FISH OIL) 1200 MG CAPS Take 1,200 mg by mouth daily. Reported on 02/14/2015  . pantoprazole (PROTONIX) 40 MG tablet Take 1 tablet (40 mg total) by mouth daily.  . Psyllium (METAMUCIL FIBER PO) Take 1 tablet by mouth daily.   . ranitidine (ZANTAC) 300 MG capsule Take 1 capsule (300 mg total) by mouth every evening.  . rosuvastatin (CRESTOR) 5 MG tablet Take 1 tablet (5 mg total) by mouth 3 (three) times a week. (take on Monday, Wednesday, and  Friday)  . traMADol (ULTRAM) 50 MG tablet Take 1/2 to 1 tablet by mouth three times daily as needed  . vitamin B-12 (CYANOCOBALAMIN) 1000 MCG tablet Take  1,000 mcg by mouth daily.    . Glucosamine-Chondroit-Vit C-Mn (GLUCOSAMINE CHONDR 1500 COMPLX PO) Take 1 tablet by mouth 3 (three) times daily.     No facility-administered encounter medications on file as of 06/19/2017.     Allergies  Allergen Reactions  . Levaquin [Levofloxacin In D5w]     Elevated BP  . Choline Fenofibrate Other (See Comments)     pt states INTOL to Trilipix w/ "thigh burning"  . Hctz [Hydrochlorothiazide]     Causes hyponatremia  . Simvastatin Other (See Comments)     pt states INTOL to STATINS \T\ refuses to restart  . Adhesive [Tape] Rash  . Bentyl [Dicyclomine Hcl]     Made me feel weird and drained me  . Ceclor [Cefaclor] Rash  . Clarithromycin Rash  . Codeine Nausea Only  . Doxycycline Rash  . Hydrocodone     Nightmare after taking cough syrup w/hydrocodone  . Lisinopril Cough    Developed ACE cough...  . Penicillins Itching and Rash    At injection site  . Tobramycin-Dexamethasone Rash    Immunization History  Administered Date(s) Administered  . Influenza Split 12/07/2010, 11/29/2011, 11/25/2012, 11/25/2013  . Influenza Whole 12/19/2005, 11/25/2007, 12/08/2008, 12/20/2009  . Influenza, High Dose Seasonal PF 12/20/2015, 12/19/2016  . Influenza,inj,Quad PF,6+ Mos 12/20/2014  . Pneumococcal Conjugate-13 04/13/2014  . Pneumococcal Polysaccharide-23 02/25/2005  . Td 07/25/2009  . Zoster 03/08/2013    Current Medications, Allergies, Past Medical History, Past Surgical History, Family History, and Social History were reviewed in [employer] record.    Review of Systems    See HPI - all other systems neg except as noted...  The patient complains of headaches.  The patient denies anorexia, fever, weight loss, weight gain, vision loss, decreased hearing, hoarseness, chest pain, syncope, dyspnea on exertion, peripheral edema, prolonged cough, hemoptysis, abdominal pain, melena, hematochezia, severe indigestion/heartburn,  hematuria, incontinence, muscle weakness, suspicious skin lesions, transient blindness, difficulty walking, depression, unusual weight change, abnormal bleeding, enlarged lymph nodes, and angioedema.   Objective:   Physical Exam    WD, WN, 82 y/o WF in NAD... GENERAL:  Alert & oriented; pleasant & cooperative... HEENT:  [place]/AT, EOM-wnl, PERRLA, EACs-clear, TMs-wnl, NOSE-clear, THROAT- clear & wnl. NECK:  Decr ROM; no JVD; normal carotid impulses w/o bruits; no thyromegaly or nodules palpated; no lymphadenopathy. CHEST:  Clear- no wheezing, rales, or signs of consolidation... HEART:  Regular Rhythm; without murmurs/ rubs/ or gallops detected... ABDOMEN:  Soft & nontender; normal bowel sounds; no organomegaly or masses palpated... EXT: without deformities, mild arthritic changes and +trigger points, no varicose veins/ venous insuffic/ or edema. NEURO:  CN's intact; motor testing normal; sensory testing normal; gait normal & balance OK. DERM:  No lesions noted; no rash etc...  RADIOLOGY DATA:  Reviewed in the EPIC EMR & discussed w/ the patient...  LABORATORY DATA:  Reviewed in the EPIC EMR & discussed w/ the patient...   Assessment & Plan:    12/19/16>   [name] is stable w/ her multisystem dis- rec to continue current meds + diet & exercise; she is going to inquire about new [facility] primary care office at [facility]; given 2018 Flu vaccine today...  06/19/17>   [name] is stable w/ her mult medical issues as documented above;  Asked to continue same meds and therapies-  continue f/u w/ her various medical specialists;  We discussed my planned retirement at th end of 2019 & the need to find another primary care physician- she will contact [place] office and establish w/ their practitioners;  DrVanWinkle can refill her prn Albuteral inhaler & we will not set her up for f/u w/ [facility], and she unnderstands that she can call at any time for help w/ her breathing should the need  arise   Asthma/ AR>   ~  Prev stable on allergy shots & XOPENEX as needed; no recent exac... Hx pill asp w/ asthma exac in past => resolved... ~  4/18>  [name] is overall stable on her current regimen;  She will continue same meds, continue nightly CPAP, continue to exercise daily, etc... She will maintain f/u w/ DrStark for GI, and DrAquino for Neuro;  She will reconsider getting on a med for her Chol- offered very very low dose Crestor vs a non-statin & she will think about it & let me know, in the meanwhile we reviewed diet & exercise.  HBP>  Controlled on BP med regimen of Metoprolol50Tid, Losartan100-1/2Tid, Hydralazine50-1/2Tid, & Amlod5...  CAD>  Cath w/ patent grafts; had neg Nuclear study; continue same meds and f/u DrNishan...  CHOL>  Prev followed in the [facility] & intol to all meds- on diet alone now 7 FLP not at goals; she will consider her options & let me know...  GI> GERD, Divertics, ?Gallstones>  Followed by [facility] & stones eval by DrWilson CCS; odd that f/u sonar 9/13 did not show stones& nuclear med hepatobiliary scan was WNL.....  BREAST CANCER>  off [place] now after 5 yrs rx & released by DrMagrinat > needs yearly mammogram & exam, periodic BMD...  FM/ HAs/ etc>  On Ultram, Tylenol, etc => needs active management of this prob by Neuro w/ an "action plan" for severe HA episodes... NECK PAIN> with multilevel spondylosis & eval by NS, DrHirsh but she was not pleased & is asking for Rheum eval...  Other medical issues as noted> on CoQ10, Glucosamine, Vit B12, Vit D, etc...   Patient's Medications  New Prescriptions   No medications on file  Previous Medications   ACETAMINOPHEN (TYLENOL 8 HOUR PO)    Take by mouth as directed.    ALBUTEROL (VENTOLIN HFA) 108 (90 BASE) MCG/ACT INHALER    Inhale 2 puffs into the lungs every 6 (six) hours as needed for wheezing or shortness of breath.   AMLODIPINE (NORVASC) 5 MG TABLET    Take 1 tablet (5 mg total) by mouth daily.    ASPIRIN EC 81 MG TABLET    Take 81 mg by mouth daily.   CALCIUM & MAGNESIUM CARBONATES (MYLANTA PO)    Take by mouth. 1-2 teaspoons as needed   CARAFATE 1 GM/10ML SUSPENSION    take 10 milliliters by mouth four times a day with meals and at bedtime   FEXOFENADINE (ALLEGRA) 180 MG TABLET    Take 180 mg by mouth daily.     FLUOCINONIDE (LIDEX) 0.05 % EXTERNAL SOLUTION    Apply 1 application topically daily as needed (scalp itching).    GLUCOSAMINE-CHONDROIT-VIT C-MN (GLUCOSAMINE CHONDR 1500 COMPLX PO)    Take 1 tablet by mouth 3 (three) times daily.     HYDRALAZINE (APRESOLINE) 50 MG TABLET    take 1/2 tablet by mouth three times a day   KETOCONAZOLE (NIZORAL) 2 % SHAMPOO    Apply 1 application topically 2 (two) times a  week. As needed for itching a couple of times week   LATANOPROST (XALATAN) 0.005 % OPHTHALMIC SOLUTION    Place 1 drop into both eyes at bedtime.    LOSARTAN (COZAAR) 100 MG TABLET    take 1/2 tablet by mouth three times a day   MAGNESIUM OXIDE (MAG-OX) 400 MG TABLET    Take 400 mg by mouth daily. Reported on 02/14/2015   METOPROLOL TARTRATE (LOPRESSOR) 50 MG TABLET    take 1 tablet by mouth three times a day   MOMETASONE (NASONEX) 50 MCG/ACT NASAL SPRAY    Place 2 sprays into the nose daily as needed (congestion).    NITROGLYCERIN (NITROSTAT) 0.4 MG SL TABLET    Place 1 tablet (0.4 mg total) under the tongue every 5 (five) minutes as needed for chest pain.   OMEGA-3 FATTY ACIDS (FISH OIL) 1200 MG CAPS    Take 1,200 mg by mouth daily. Reported on 02/14/2015   PANTOPRAZOLE (PROTONIX) 40 MG TABLET    Take 1 tablet (40 mg total) by mouth daily.   PSYLLIUM (METAMUCIL FIBER PO)    Take 1 tablet by mouth daily.    RANITIDINE (ZANTAC) 300 MG CAPSULE    Take 1 capsule (300 mg total) by mouth every evening.   ROSUVASTATIN (CRESTOR) 5 MG TABLET    Take 1 tablet (5 mg total) by mouth 3 (three) times a week. (take on Monday, Wednesday, and Friday)   TRAMADOL (ULTRAM) 50 MG TABLET    Take 1/2 to  1 tablet by mouth three times daily as needed   VITAMIN B-12 (CYANOCOBALAMIN) 1000 MCG TABLET    Take 1,000 mcg by mouth daily.    Modified Medications   No medications on file  Discontinued Medications   No medications on file

## 2017-06-30 DIAGNOSIS — J3089 Other allergic rhinitis: Secondary | ICD-10-CM | POA: Diagnosis not present

## 2017-06-30 DIAGNOSIS — J301 Allergic rhinitis due to pollen: Secondary | ICD-10-CM | POA: Diagnosis not present

## 2017-07-01 ENCOUNTER — Other Ambulatory Visit: Payer: Self-pay | Admitting: *Deleted

## 2017-07-01 DIAGNOSIS — R200 Anesthesia of skin: Secondary | ICD-10-CM

## 2017-07-07 ENCOUNTER — Other Ambulatory Visit: Payer: Self-pay | Admitting: Cardiovascular Disease

## 2017-07-07 ENCOUNTER — Other Ambulatory Visit: Payer: Self-pay | Admitting: Pulmonary Disease

## 2017-07-07 MED FILL — ROSUVASTATIN CALCIUM 5 MG T: 5 | 70 days supply | Qty: 30 | Fill #0

## 2017-07-07 MED FILL — AMLODIPINE BESYLATE 5 MG TA: 5 | 90 days supply | Qty: 90 | Fill #0

## 2017-07-08 ENCOUNTER — Ambulatory Visit (INDEPENDENT_AMBULATORY_CARE_PROVIDER_SITE_OTHER): Payer: Medicare Other | Admitting: Neurology

## 2017-07-08 DIAGNOSIS — R200 Anesthesia of skin: Secondary | ICD-10-CM

## 2017-07-08 DIAGNOSIS — M5417 Radiculopathy, lumbosacral region: Secondary | ICD-10-CM

## 2017-07-08 DIAGNOSIS — G5601 Carpal tunnel syndrome, right upper limb: Secondary | ICD-10-CM

## 2017-07-08 NOTE — Procedures (Signed)
[facility]  [address]  [place], [place] [address] Tel: [phone] Fax:  [phone] Test Date:  07/08/2017  Patient: [name] DOB: [birth year] Physician: Narda Amber, DO  Sex: Female Height: 4\' 10"  Ref Phys: Ellouise Newer, MD  ID#: [id number] Temp: 33.0 Technician:    Patient Complaints:  This is a 82 year old female referred for evaluation of right hand and foot numbness and tingling.  NCV & EMG Findings: Extensive electrodiagnostic testing of the right upper and lower extremity shows:  1. Right median sensory response shows prolonged latency (4.1 ms) and normal amplitude. Right ulnar, sural, and superficial peroneal sensory responses are within normal limits. 2. Right median motor response shows prolonged latency (5.3 ms).  Right ulnar, peroneal, and tibial motor responses are within normal limits.  3. Right tibial H reflex study is within normal limits. 4. In the right upper extremity, chronic motor axon loss changes are seen affecting the pronator teres and biceps muscles, without accompanied active denervation.  5. In the right lower extremity, chronic motor axon loss changes are seen affecting the medial gastrocnemius and biceps femoris short muscle, without accompanied active denervation.   Impression: 1. Right median neuropathy at or distal to the wrist, consistent with the clinical diagnosis of carpal tunnel syndrome. Overall, moderate in degree electrically. 2. Chronic S1 radiculopathy affecting the right lower extremity, mild in degree.  3. There is no evidence of sensorimotor polyneuropathy affecting the right lower extremity.   ___________________________ Narda Amber, DO    Nerve Conduction Studies Anti Sensory Summary Table   Site NR Peak (ms) Norm Peak (ms) P-T Amp (V) Norm P-T Amp  Right Median Anti Sensory (2nd Digit)  33C  Wrist    4.1 <3.8 13.2 >10  Right Sup Peroneal Anti Sensory (Ant Lat Mall)  33C  12 cm    2.0  <4.6 9.3 >3  Right Sural Anti Sensory (Lat Mall)  33C  Calf    2.5 <4.6 12.0 >3  Right Ulnar Anti Sensory (5th Digit)  33C  Wrist    2.3 <3.2 16.2 >5   Motor Summary Table   Site NR Onset (ms) Norm Onset (ms) O-P Amp (mV) Norm O-P Amp Site1 Site2 Delta-0 (ms) Dist (cm) Vel (m/s) Norm Vel (m/s)  Right Median Motor (Abd Poll Brev)  33C  Wrist    5.3 <4.0 8.8 >5 Elbow Wrist 4.6 25.5 55 >50  Elbow    9.9  8.5         Right Peroneal Motor (Ext Dig Brev)  33C  Ankle    3.4 <6.0 6.2 >2.5 B Fib Ankle 6.2 30.0 48 >40  B Fib    9.6  5.5  Poplt B Fib 1.3 8.0 62 >40  Poplt    10.9  5.3         Right Tibial Motor (Abd Hall Brev)  33C  Ankle    3.3 <6.0 11.4 >4 Knee Ankle 7.1 34.0 48 >40  Knee    10.4  6.5         Right Ulnar Motor (Abd Dig Minimi)  33C  Wrist    1.9 <3.1 8.3 >7 B Elbow Wrist 2.9 19.5 67 >50  B Elbow    4.8  8.1  A Elbow B Elbow 1.6 10.0 62 >50  A Elbow    6.4  8.0          H Reflex Studies   NR H-Lat (ms) Lat Norm (ms) L-R H-Lat (ms)  Right Tibial (Gastroc)  33C     28.44 <35    EMG   Side Muscle Ins Act Fibs Psw Fasc Number Recrt Dur Dur. Amp Amp. Poly Poly. Comment  Right 1stDorInt Nml Nml Nml Nml Nml Nml Nml Nml Nml Nml Nml Nml N/A  Right Abd Poll Brev Nml Nml Nml Nml Nml Nml Nml Nml Nml Nml Nml Nml N/A  Right PronatorTeres Nml Nml Nml Nml 1- Rapid Some 1+ Some 1+ Nml Nml N/A  Right Biceps Nml Nml Nml Nml 1- Rapid Some 1+ Some 1+ Nml Nml N/A  Right Triceps Nml Nml Nml Nml Nml Nml Nml Nml Nml Nml Nml Nml N/A  Right Deltoid Nml Nml Nml Nml Nml Nml Nml Nml Nml Nml Nml Nml N/A  Right AntTibialis Nml Nml Nml Nml Nml Nml Nml Nml Nml Nml Nml Nml N/A  Right Gastroc Nml Nml Nml Nml 2- Rapid Some 1+ Some 1+ Some 1+ N/A  Right Flex Dig Long Nml Nml Nml Nml Nml Nml Nml Nml Nml Nml Nml Nml N/A  Right RectFemoris Nml Nml Nml Nml Nml Nml Nml Nml Nml Nml Nml Nml N/A  Right GluteusMed Nml Nml Nml Nml Nml Nml Nml Nml Nml Nml Nml Nml N/A  Right BicepsFemS Nml Nml Nml Nml 1-  Rapid Some 1+ Some 1+ Nml Nml N/A      Waveforms:

## 2017-07-10 DIAGNOSIS — R928 Other abnormal and inconclusive findings on diagnostic imaging of breast: Secondary | ICD-10-CM | POA: Diagnosis not present

## 2017-07-10 DIAGNOSIS — Z853 Personal history of malignant neoplasm of breast: Secondary | ICD-10-CM | POA: Diagnosis not present

## 2017-07-17 DIAGNOSIS — J301 Allergic rhinitis due to pollen: Secondary | ICD-10-CM | POA: Diagnosis not present

## 2017-07-17 DIAGNOSIS — J3089 Other allergic rhinitis: Secondary | ICD-10-CM | POA: Diagnosis not present

## 2017-07-24 ENCOUNTER — Telehealth: Payer: Self-pay | Admitting: Neurology

## 2017-07-24 NOTE — Telephone Encounter (Signed)
Patient is wanting the results of the EMG

## 2017-07-24 NOTE — Telephone Encounter (Signed)
Pt has been made aware of results

## 2017-07-25 ENCOUNTER — Other Ambulatory Visit: Payer: Self-pay | Admitting: *Deleted

## 2017-07-25 ENCOUNTER — Telehealth: Payer: Self-pay | Admitting: *Deleted

## 2017-07-25 DIAGNOSIS — M5417 Radiculopathy, lumbosacral region: Secondary | ICD-10-CM

## 2017-07-25 DIAGNOSIS — M79606 Pain in leg, unspecified: Secondary | ICD-10-CM

## 2017-07-25 NOTE — Telephone Encounter (Signed)
I spoke with patient and gave her the results and instructions.  She has chosen to try the wrist splint and PT for now.  Will send a referral for PT today.

## 2017-07-25 NOTE — Telephone Encounter (Signed)
-----   Message from Lattie Corns sent at 07/24/2017  4:08 PM EDT -----   ----- Message ----- From: Cameron Sprang, MD Sent: 07/09/2017   9:48 AM To: Lyman Speller Costello  Pls let her know the nerve test showed carpal tunnel syndrome on her right wrist which is causing the numbness. Does she want to see a hand surgeon? In the meantime, she can start wearing a wrist splint she can get at the pharmacy. It also showed a pinched nerve in her back causing the numbness in her foot. Physical therapy can help if it is very bothersome for her. Thanks

## 2017-07-31 DIAGNOSIS — J301 Allergic rhinitis due to pollen: Secondary | ICD-10-CM | POA: Diagnosis not present

## 2017-07-31 DIAGNOSIS — J3089 Other allergic rhinitis: Secondary | ICD-10-CM | POA: Diagnosis not present

## 2017-08-05 ENCOUNTER — Other Ambulatory Visit: Payer: Self-pay

## 2017-08-05 ENCOUNTER — Ambulatory Visit: Payer: Medicare Other | Attending: Neurology | Admitting: Physical Therapy

## 2017-08-05 DIAGNOSIS — M6281 Muscle weakness (generalized): Secondary | ICD-10-CM | POA: Diagnosis not present

## 2017-08-05 DIAGNOSIS — M5416 Radiculopathy, lumbar region: Secondary | ICD-10-CM

## 2017-08-05 NOTE — Therapy (Signed)
[facility] [place], [place], [address] Phone: [phone]   Fax:  [phone]  Physical Therapy Evaluation  Patient Details  Name: [name] MRN: [id number] Date of Birth: 07/27/1935 Referring Provider: Ellouise Newer MD   Encounter Date: 08/05/2017  PT End of Session - 08/05/17 1056    Visit Number  1    Date for PT Re-Evaluation  09/30/17    Authorization Type  MCR; add KX    PT Start Time  1057    PT Stop Time  1200    PT Time Calculation (min)  63 min    Activity Tolerance  Patient tolerated treatment well    Behavior During Therapy  [facility] for tasks assessed/performed       Past Medical History:  Diagnosis Date  . Asthma   . Atrophic vaginitis   . Breast cancer, Left 12/20/2010   NO BLOOD PRESSURE CHECKS OR STICKS IN LEFT ARM  . CAD (coronary artery disease)    a. 01/19/2010 s/p CABG x 3, lima->lad, vg->diag, vg->om1;  b. 06/2011 :Lexi MV: EF 84%, No ischemia. c. 01/06/14 s/p negative nuclear stress test with EF >70%  . Cervical arthritis   . Colonic polyp 04-27-2009   tubular adenoma  . Diverticulosis of colon (without mention of hemorrhage)   . Fibromyalgia   . Gallstones   . GERD (gastroesophageal reflux disease)   . Glaucoma   . Headache(784.0)    a. frequently assocaited with high BPs  . Hiatal hernia   . Hypercholesterolemia   . Irritable bowel syndrome   . Labile hypertension     Past Surgical History:  Procedure Laterality Date  . [place] STUDY N/A 01/08/2016   Procedure: [place] STUDY;  Surgeon: Ladene Artist, MD;  Location: [facility];  Service: Endoscopy;  Laterality: N/A;  . BREAST LUMPECTOMY Left 02/06/11  . CATARACT EXTRACTION, BILATERAL     bilateral caaract removal,  . CORONARY ANGIOPLASTY WITH STENT PLACEMENT     Stent 2007  . CORONARY ARTERY BYPASS GRAFT    . ESOPHAGEAL MANOMETRY N/A 01/08/2016   Procedure: ESOPHAGEAL MANOMETRY (EM);  Surgeon:  Ladene Artist, MD;  Location: [facility]OS[facility];  Service: Endoscopy;  Laterality: N/A;  . open heart surgery     01/19/2010    There were no vitals filed for this visit.   Subjective Assessment - 08/05/17 1106    Subjective  Patient has long h/o low back pain which has worsened in the past few months. She began feeling pain into her RLE down to the ankle. She reports her foot goes to sleep intermittently. NCV test showed a pinched nerve.     Pertinent History  HTN, h/o CA, recent bone density (unsure of results)    Limitations  Standing;Sitting;Walking    How long can you sit comfortably?  1 hour    How long can you walk comfortably?  25-30 min    Diagnostic tests  NCV - pinched nerve    Patient Stated Goals  to decrease pain    Currently in Pain?  Yes    Pain Score  5     Pain Location  Back    Pain Orientation  Right    Pain Descriptors / Indicators  Aching;Dull    Pain Type  Chronic pain    Pain Radiating Towards  to RLE to ankle    Aggravating Factors   standing in one spot, sitting  Pain Relieving Factors  moving positions    Effect of Pain on Daily Activities  limiting to ADLS         [facility] PT Assessment - 08/05/17 0001      Assessment   Medical Diagnosis  lumbosacral radiculopathy, pain of LE    Referring Provider  Ellouise Newer MD    Onset Date/Surgical Date  05/09/17    Next MD Visit  one year    Prior Therapy  no      Precautions   Precautions  None      Balance Screen   Has the patient fallen in the past 6 months  Yes    How many times?  1 fell to knees in garden early June    Has the patient had a decrease in activity level because of a fear of falling?   No    Is the patient reluctant to leave their home because of a fear of falling?   No      Home Environment   Living Environment  Private residence    Living Arrangements  Spouse/significant other    Type of [place] to enter    Entrance Stairs-Number of Steps  1    [place] or work area in basement;One level      Prior Function   Level of Independence  Independent    Vocation  Retired      Observation/Other Assessments   Focus on Therapeutic Outcomes (FOTO)   43% limited      Posture/Postural Control   Posture Comments  Mild tightness R QL, else WNL      ROM / Strength   AROM / PROM / Strength  AROM;Strength      AROM   Overall AROM Comments  Full lumbar except rotation decreased 50% bil      Strength   Overall Strength Comments  R ankle Ever 3+/5. L 4+/5    Strength Assessment Site  Hip;Knee    Right/Left Hip  Right;Left    Right Hip Flexion  4-/5    Right Hip Extension  4+/5    Right Hip ABduction  4-/5    Left Hip Flexion  4/5    Left Hip Extension  4+/5    Left Hip ABduction  5/5    Right/Left Knee  Right;Left    Right Knee Flexion  4/5    Right Knee Extension  5/5    Left Knee Flexion  4+/5    Left Knee Extension  5/5      Flexibility   Soft Tissue Assessment /Muscle Length  yes    Hamstrings  mod tightness bil    Quadriceps  R very tight 90 deg in prone    Piriformis  mod bil    Quadratus Lumborum  mild R      Palpation   Palpation comment  marked R gluteals, piriformis, lateral HS; mod in L gluteals piriformis      Special Tests   Other special tests  + mild + SLR on R, - slump R                 Objective measurements completed on examination: See above findings.              PT Education - 08/05/17 1152    Education Details  HEP    Person(s) Educated  Patient  Methods  Explanation;Demonstration;Handout    Comprehension  Verbalized understanding;Returned demonstration       PT Short Term Goals - 08/05/17 1201      PT SHORT TERM GOAL #1   Title  pt will be I with initial HEP      Time  3    Period  Weeks    Status  New    Target Date  08/26/17      PT SHORT TERM GOAL #2   Title  ----------------------------------        PT Long Term Goals -  08/05/17 1202      PT LONG TERM GOAL #1   Title  Pt I with advanced HEP    Time  6    Period  Weeks    Status  New    Target Date  10/28/17      PT LONG TERM GOAL #2   Title  Patient to report decreased frequency of RLE pain by 50% or more with ADLS.    Time  6    Period  Weeks    Status  New      PT LONG TERM GOAL #3   Title  Patient to report decreased intensity of back and LE pain by 50% or more with ADLS.    Time  6    Period  Weeks    Status  New      PT LONG TERM GOAL #4   Title  Patient to demo improved strength in RLE strength including R ankle eversion to 4+/5    Time  6    Period  Weeks    Status  New      PT LONG TERM GOAL #5   Title  -----------------------------------             Plan - 08/05/17 1153    Clinical Impression Statement  Patient presents for low complexity evaluation for low back pain with radiculopathy. She has good lumbar ROM but is very tight in BLE R >L and has strength deficits in BLE R>L as well. She also c/o intermittent numbness in her R foot.  All of these deficits are affecting her ADLS including sitting, standing and walking. She had one recent fall on uneven ground as well.    History and Personal Factors relevant to plan of care:  HTN    Clinical Presentation  Stable    Clinical Decision Making  Low    Rehab Potential  Good    PT Frequency  2x / week    PT Duration  6 weeks    PT Treatment/Interventions  ADLs/Self Care Home Management;Cryotherapy;Electrical Stimulation;Moist Heat;Traction;Therapeutic exercise;Neuromuscular re-education;Patient/family education;Manual techniques;Dry needling;Taping confirm bone density results before doing traction    PT Next Visit Plan  flexibility (piriformis, quads, HS); BLE strength including R ankle eversion; manual and/or DN to R gluteals/piriformis    PT Home Exercise Plan  seated piriformis and HS stretch    Consulted and Agree with Plan of Care  Patient       Patient will benefit from  skilled therapeutic intervention in order to improve the following deficits and impairments:  Decreased activity tolerance, Decreased strength, Pain, Decreased range of motion, Impaired flexibility  Visit Diagnosis: Radiculopathy, lumbar region - Plan: PT plan of care cert/re-cert  Muscle weakness (generalized) - Plan: PT plan of care cert/re-cert     Problem List Patient Active Problem List   Diagnosis Date Noted  . S/P CABG (coronary artery bypass graft)  12/19/2016  . Abdominal pain, epigastric 04/11/2016  . Nausea 04/11/2016  . 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  . Belching 01/03/2016  . Bloating 05/23/2015  . Diarrhea 05/23/2015  . Heartburn 05/23/2015  . Bilateral occipital neuralgia 11/23/2014  . Cervicogenic headache 11/23/2014  . Neck pain 11/23/2014  . OSA on CPAP 11/23/2014  . Generalized anxiety disorder 02/04/2014  . Labile hypertension   . Fibromyalgia   . Cervical spondylolysis 12/24/2013  . CAD (coronary artery disease) 12/24/2013  . Hypertensive emergency 12/23/2013  . Acute encephalopathy 12/23/2013  . Bronchitis, chronic obstructive w acute bronchitis ([place]) 12/21/2013  . Diverticulosis of colon without hemorrhage 11/15/2013  . Hot flashes related to aromatase inhibitor therapy 07/27/2013  . Osteopenia 07/27/2013  . DJD (degenerative joint disease) 09/29/2012  . Breast cancer, Left 12/20/2010  . Leg pain 11/28/2010  . FATTY LIVER DISEASE 01/09/2010  . GASTRITIS 12/16/2007  . Allergic rhinitis 02/18/2007  . DIZZINESS, CHRONIC 02/18/2007  . Headache in back of head 02/18/2007  . Mixed hyperlipidemia 12/23/2006  . GLAUCOMA 12/23/2006  . Coronary atherosclerosis 12/23/2006  . Asthma 12/23/2006  . Gastroesophageal reflux disease 12/23/2006  . IRRITABLE BOWEL SYNDROME 12/23/2006  . Myalgia and myositis 12/23/2006    Halie Gass PT 08/05/2017, 12:14 PM  [facility] [address]  [place], [place], [address] Phone: [phone]   Fax:  [phone]  Name: [name] MRN: [id number] Date of Birth: 1935/11/17

## 2017-08-05 NOTE — Patient Instructions (Signed)
Leg Extension (Hamstring)   Sit toward front edge of chair, with leg out straight, heel on floor, toes pointing toward body. Keeping back straight, bend forward at hip, until a stretch is felt. Hold 30-60 seconds. Repeat _3__ times. Repeat with other leg. Do __2-3_ sessions per day.Left foot relaxed, knee straight, other leg bent, foot flat. Raise straight leg further upward to maximal range. Hold _30-60__ seconds. Relax leg completely down. Repeat 3___ times.  Stretching: Hamstring (Sitting)   With right leg straight, tuck other foot near groin. Reach down until stretch is felt in back of thigh. Keep back straight. Hold _30-60___ seconds. Repeat __3__ times per set. Do ____ sets per session. Do _2-3___ sessions per day.   Hip Stretch  Put right ankle over left knee. Let right knee fall downward, but keep ankle in place. Feel the stretch in hip. May push down gently with hand to feel stretch. Hold _30-60___ seconds while counting out loud. Repeat with other leg. Repeat __3__ times. Do _2-3___ sessions per day.   Madelyn Flavors, PT 08/05/17 11:44 AM;

## 2017-08-12 ENCOUNTER — Encounter: Payer: Self-pay | Admitting: Physical Therapy

## 2017-08-12 ENCOUNTER — Ambulatory Visit: Payer: Medicare Other | Admitting: Physical Therapy

## 2017-08-12 DIAGNOSIS — M5416 Radiculopathy, lumbar region: Secondary | ICD-10-CM

## 2017-08-12 DIAGNOSIS — M6281 Muscle weakness (generalized): Secondary | ICD-10-CM

## 2017-08-12 NOTE — Therapy (Signed)
[place], [place], [address] Phone: [phone]   Fax:  [phone]  Physical Therapy Treatment  Patient Details  Name: [name] MRN: [id number] Date of Birth: [birth year] Referring Provider: Ellouise Newer MD   Encounter Date: 08/12/2017  PT End of Session - 08/12/17 1339    Visit Number  2    Date for PT Re-Evaluation  09/30/17    PT Start Time  1310    PT Stop Time  1400    PT Time Calculation (min)  50 min       Past Medical History:  Diagnosis Date  . Asthma   . Atrophic vaginitis   . Breast cancer, Left 12/20/2010   NO BLOOD PRESSURE CHECKS OR STICKS IN LEFT ARM  . CAD (coronary artery disease)    a. 01/19/2010 s/p CABG x 3, lima->lad, vg->diag, vg->om1;  b. 06/2011 :Lexi MV: EF 84%, No ischemia. c. 01/06/14 s/p negative nuclear stress test with EF >70%  . Cervical arthritis   . Colonic polyp 04-27-2009   tubular adenoma  . Diverticulosis of colon (without mention of hemorrhage)   . Fibromyalgia   . Gallstones   . GERD (gastroesophageal reflux disease)   . Glaucoma   . Headache(784.0)    a. frequently assocaited with high BPs  . Hiatal hernia   . Hypercholesterolemia   . Irritable bowel syndrome   . Labile hypertension     Past Surgical History:  Procedure Laterality Date  . [place] STUDY N/A 01/08/2016   Procedure: [place] STUDY;  Surgeon: Ladene Artist, MD;  Location: [facility];  Service: Endoscopy;  Laterality: N/A;  . BREAST LUMPECTOMY Left 02/06/11  . CATARACT EXTRACTION, BILATERAL     bilateral caaract removal,  . CORONARY ANGIOPLASTY WITH STENT PLACEMENT     Stent 2007  . CORONARY ARTERY BYPASS GRAFT    . ESOPHAGEAL MANOMETRY N/A 01/08/2016   Procedure: ESOPHAGEAL MANOMETRY (EM);  Surgeon: Ladene Artist, MD;  Location: [facility];  Service: Endoscopy;  Laterality: N/A;  . open heart surgery     01/19/2010    There were no vitals filed for  this visit.  Subjective Assessment - 08/12/17 1310    Subjective  pain not so bad but running down RT leg    Currently in Pain?  Yes    Pain Score  4     Pain Location  Leg    Pain Orientation  Right                       [facility] Adult PT Treatment/Exercise - 08/12/17 0001      Exercises   Exercises  Knee/Hip      Knee/Hip Exercises: Aerobic   Nustep  L 3 5 min      Knee/Hip Exercises: Standing   Other Standing Knee Exercises  hip flex,ext and abd 2# 15 times each      Knee/Hip Exercises: Seated   Long Arc Quad  Both;2 sets;10 reps;Weights    Long Arc Quad Weight  2 lbs.    Ball Squeeze  15    Clamshell with TheraBand  Red    Marching  Both;15 reps red tabnd    Hamstring Curl  Both;15 reps red tband      Modalities   Modalities  Moist Heat;Traction      Moist Heat Therapy   Number Minutes Moist Heat  15  Minutes    Moist Heat Location  Lumbar Spine      Traction   Type of Traction  Lumbar    Max (lbs)  50    Time  15      Manual Therapy   Manual Therapy  Passive ROM    Passive ROM  LE and trunk               PT Short Term Goals - 08/05/17 1201      PT SHORT TERM GOAL #1   Title  pt will be I with initial HEP      Time  3    Period  Weeks    Status  New    Target Date  08/26/17      PT SHORT TERM GOAL #2   Title  ----------------------------------        PT Long Term Goals - 08/05/17 1202      PT LONG TERM GOAL #1   Title  Pt I with advanced HEP    Time  6    Period  Weeks    Status  New    Target Date  10/28/17      PT LONG TERM GOAL #2   Title  Patient to report decreased frequency of RLE pain by 50% or more with ADLS.    Time  6    Period  Weeks    Status  New      PT LONG TERM GOAL #3   Title  Patient to report decreased intensity of back and LE pain by 50% or more with ADLS.    Time  6    Period  Weeks    Status  New      PT LONG TERM GOAL #4   Title  Patient to demo improved strength in RLE strength  including R ankle eversion to 4+/5    Time  6    Period  Weeks    Status  New      PT LONG TERM GOAL #5   Title  -----------------------------------            Plan - 08/12/17 1339    Clinical Impression Statement  pt tolerated initial ex interventions well, some postural and speed cuing needed. PROM RT LE tighter than Left. Tolerated traction well.    PT Treatment/Interventions  ADLs/Self Care Home Management;Cryotherapy;Electrical Stimulation;Moist Heat;Traction;Therapeutic exercise;Neuromuscular re-education;Patient/family education;Manual techniques;Dry needling;Taping    PT Next Visit Plan  assess initail ther ex and traction and progress. PROM LE and trunk       Patient will benefit from skilled therapeutic intervention in order to improve the following deficits and impairments:  Decreased activity tolerance, Decreased strength, Pain, Decreased range of motion, Impaired flexibility  Visit Diagnosis: Radiculopathy, lumbar region  Muscle weakness (generalized)     Problem List Patient Active Problem List   Diagnosis Date Noted  . S/P CABG (coronary artery bypass graft) 12/19/2016  . Abdominal pain, epigastric 04/11/2016  . Nausea 04/11/2016  . 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  . Belching 01/03/2016  . Bloating 05/23/2015  . Diarrhea 05/23/2015  . Heartburn 05/23/2015  . Bilateral occipital neuralgia 11/23/2014  . Cervicogenic headache 11/23/2014  . Neck pain 11/23/2014  . OSA on CPAP 11/23/2014  . Generalized anxiety disorder 02/04/2014  . Labile hypertension   . Fibromyalgia   . Cervical spondylolysis 12/24/2013  . CAD (coronary artery disease) 12/24/2013  . Hypertensive emergency 12/23/2013  . Acute encephalopathy 12/23/2013  . Bronchitis, chronic  obstructive w acute bronchitis ([place]) 12/21/2013  . Diverticulosis of colon without hemorrhage 11/15/2013  . Hot flashes related to aromatase inhibitor therapy 07/27/2013  . Osteopenia 07/27/2013  . DJD  (degenerative joint disease) 09/29/2012  . Breast cancer, Left 12/20/2010  . Leg pain 11/28/2010  . FATTY LIVER DISEASE 01/09/2010  . GASTRITIS 12/16/2007  . Allergic rhinitis 02/18/2007  . DIZZINESS, CHRONIC 02/18/2007  . Headache in back of head 02/18/2007  . Mixed hyperlipidemia 12/23/2006  . GLAUCOMA 12/23/2006  . Coronary atherosclerosis 12/23/2006  . Asthma 12/23/2006  . Gastroesophageal reflux disease 12/23/2006  . IRRITABLE BOWEL SYNDROME 12/23/2006  . Myalgia and myositis 12/23/2006    Gehrig Patras,ANGIE PTA 08/12/2017, 1:54 PM  [place], [place], [address] Phone: [phone]   Fax:  [phone]  Name: [name] MRN: [id number] Date of Birth: Nov 20, 1935

## 2017-08-14 ENCOUNTER — Ambulatory Visit: Payer: Medicare Other | Admitting: Physical Therapy

## 2017-08-14 DIAGNOSIS — M5416 Radiculopathy, lumbar region: Secondary | ICD-10-CM | POA: Diagnosis not present

## 2017-08-14 DIAGNOSIS — M6281 Muscle weakness (generalized): Secondary | ICD-10-CM | POA: Diagnosis not present

## 2017-08-14 NOTE — Therapy (Signed)
[place], [place], [address] Phone: [phone]   Fax:  [phone]  Physical Therapy Treatment  Patient Details  Name: [name] MRN: [id number] Date of Birth: 31-Mar-1935 Referring Provider: Ellouise Newer MD   Encounter Date: 08/14/2017  PT End of Session - 08/14/17 1442    Visit Number  3    Date for PT Re-Evaluation  09/30/17    PT Start Time  1400    PT Stop Time  1500    PT Time Calculation (min)  60 min       Past Medical History:  Diagnosis Date  . Asthma   . Atrophic vaginitis   . Breast cancer, Left 12/20/2010   NO BLOOD PRESSURE CHECKS OR STICKS IN LEFT ARM  . CAD (coronary artery disease)    a. 01/19/2010 s/p CABG x 3, lima->lad, vg->diag, vg->om1;  b. 06/2011 :Lexi MV: EF 84%, No ischemia. c. 01/06/14 s/p negative nuclear stress test with EF >70%  . Cervical arthritis   . Colonic polyp 04-27-2009   tubular adenoma  . Diverticulosis of colon (without mention of hemorrhage)   . Fibromyalgia   . Gallstones   . GERD (gastroesophageal reflux disease)   . Glaucoma   . Headache(784.0)    a. frequently assocaited with high BPs  . Hiatal hernia   . Hypercholesterolemia   . Irritable bowel syndrome   . Labile hypertension     Past Surgical History:  Procedure Laterality Date  . [place] STUDY N/A 01/08/2016   Procedure: [place] STUDY;  Surgeon: Ladene Artist, MD;  [facility]: [facility];  Service: Endoscopy;  Laterality: N/A;  . BREAST LUMPECTOMY Left 02/06/11  . CATARACT EXTRACTION, BILATERAL     bilateral caaract removal,  . CORONARY ANGIOPLASTY WITH STENT PLACEMENT     Stent 2007  . CORONARY ARTERY BYPASS GRAFT    . ESOPHAGEAL MANOMETRY N/A 01/08/2016   Procedure: ESOPHAGEAL MANOMETRY (EM);  Surgeon: Ladene Artist, MD;  Location: [facility];  Service: Endoscopy;  Laterality: N/A;  . open heart surgery     01/19/2010    There were no vitals filed for  this visit.  Subjective Assessment - 08/14/17 1408    Subjective  traction was okay but weird. that night pain in calf butthen went away. I think I like estim better. pain comes and goes    Currently in Pain?  Yes    Pain Score  3     Pain Location  Back    Pain Orientation  Right                       [facility] Adult PT Treatment/Exercise - 08/14/17 0001      Knee/Hip Exercises: Aerobic   Nustep  L 4 6 min      Knee/Hip Exercises: Machines for Strengthening   Cybex Knee Extension  5# 2 sets 10    Cybex Knee Flexion  15# 2 sets 10    Other Machine  row and lats 15# 2 sets 10      Knee/Hip Exercises: Standing   Other Standing Knee Exercises  hip flex,ext and abd red tband 15 times each on airex      Knee/Hip Exercises: Seated   Ball Squeeze  15    Clamshell with TheraBand  Red      Modalities   Modalities  Moist Heat;Electrical Stimulation  Moist Heat Therapy   Number Minutes Moist Heat  15 Minutes    Moist Heat Location  Lumbar Spine      Electrical Stimulation   Electrical Stimulation Location  LB    Electrical Stimulation Action  IFC    Electrical Stimulation Parameters  supine    Electrical Stimulation Goals  Pain      Manual Therapy   Manual Therapy  Passive ROM    Passive ROM  LE and trunk               PT Short Term Goals - 08/14/17 1442      PT SHORT TERM GOAL #1   Title  pt will be I with initial HEP      Status  Partially Met        PT Long Term Goals - 08/05/17 1202      PT LONG TERM GOAL #1   Title  Pt I with advanced HEP    Time  6    Period  Weeks    Status  New    Target Date  10/28/17      PT LONG TERM GOAL #2   Title  Patient to report decreased frequency of RLE pain by 50% or more with ADLS.    Time  6    Period  Weeks    Status  New      PT LONG TERM GOAL #3   Title  Patient to report decreased intensity of back and LE pain by 50% or more with ADLS.    Time  6    Period  Weeks    Status  New      PT  LONG TERM GOAL #4   Title  Patient to demo improved strength in RLE strength including R ankle eversion to 4+/5    Time  6    Period  Weeks    Status  New      PT LONG TERM GOAL #5   Title  -----------------------------------            Plan - 08/14/17 1442    Clinical Impression Statement  increased ther ex for strength in clinic and needed posture cuing and cuing for control of mvmt. STG met partially for stretching.    PT Treatment/Interventions  ADLs/Self Care Home Management;Cryotherapy;Electrical Stimulation;Moist Heat;Traction;Therapeutic exercise;Neuromuscular re-education;Patient/family education;Manual techniques;Dry needling;Taping    PT Next Visit Plan  Assess and progress. Increase HEP       Patient will benefit from skilled therapeutic intervention in order to improve the following deficits and impairments:  Decreased activity tolerance, Decreased strength, Pain, Decreased range of motion, Impaired flexibility  Visit Diagnosis: Radiculopathy, lumbar region  Muscle weakness (generalized)     Problem List Patient Active Problem List   Diagnosis Date Noted  . S/P CABG (coronary artery bypass graft) 12/19/2016  . Abdominal pain, epigastric 04/11/2016  . Nausea 04/11/2016  . 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  . Belching 01/03/2016  . Bloating 05/23/2015  . Diarrhea 05/23/2015  . Heartburn 05/23/2015  . Bilateral occipital neuralgia 11/23/2014  . Cervicogenic headache 11/23/2014  . Neck pain 11/23/2014  . OSA on CPAP 11/23/2014  . Generalized anxiety disorder 02/04/2014  . Labile hypertension   . Fibromyalgia   . Cervical spondylolysis 12/24/2013  . CAD (coronary artery disease) 12/24/2013  . Hypertensive emergency 12/23/2013  . Acute encephalopathy 12/23/2013  . Bronchitis, chronic obstructive w acute bronchitis ([place]) 12/21/2013  . Diverticulosis of colon without hemorrhage 11/15/2013  .  Hot flashes related to aromatase inhibitor therapy  07/27/2013  . Osteopenia 07/27/2013  . DJD (degenerative joint disease) 09/29/2012  . Breast cancer, Left 12/20/2010  . Leg pain 11/28/2010  . FATTY LIVER DISEASE 01/09/2010  . GASTRITIS 12/16/2007  . Allergic rhinitis 02/18/2007  . DIZZINESS, CHRONIC 02/18/2007  . Headache in back of head 02/18/2007  . Mixed hyperlipidemia 12/23/2006  . GLAUCOMA 12/23/2006  . Coronary atherosclerosis 12/23/2006  . Asthma 12/23/2006  . Gastroesophageal reflux disease 12/23/2006  . IRRITABLE BOWEL SYNDROME 12/23/2006  . Myalgia and myositis 12/23/2006    Rajeev Escue,ANGIE PTA 08/14/2017, 2:44 PM  [place] [address] [place], [place], [address] Phone: [phone]   Fax:  [phone]  Name: [name] MRN: [id number] Date of Birth: 07-20-1935

## 2017-08-21 ENCOUNTER — Encounter: Payer: Self-pay | Admitting: Physical Therapy

## 2017-08-21 ENCOUNTER — Ambulatory Visit: Payer: Medicare Other | Admitting: Physical Therapy

## 2017-08-21 DIAGNOSIS — H40013 Open angle with borderline findings, low risk, bilateral: Secondary | ICD-10-CM | POA: Diagnosis not present

## 2017-08-21 DIAGNOSIS — M5416 Radiculopathy, lumbar region: Secondary | ICD-10-CM

## 2017-08-21 DIAGNOSIS — M6281 Muscle weakness (generalized): Secondary | ICD-10-CM | POA: Diagnosis not present

## 2017-08-21 NOTE — Therapy (Signed)
[place], [place], [address] Phone: [phone]   Fax:  [phone]  Physical Therapy Treatment  Patient Details  Name: [name] MRN: [id number] Date of Birth: 08-12-1935 Referring Provider: Ellouise Newer MD   Encounter Date: 08/21/2017  PT End of Session - 08/21/17 1355    Visit Number  4    Date for PT Re-Evaluation  09/30/17    Authorization Type  MCR; add KX    PT Start Time  1230    PT Stop Time  1310    PT Time Calculation (min)  40 min    Activity Tolerance  Patient tolerated treatment well    Behavior During Therapy  [facility] for tasks assessed/performed       Past Medical History:  Diagnosis Date  . Asthma   . Atrophic vaginitis   . Breast cancer, Left 12/20/2010   NO BLOOD PRESSURE CHECKS OR STICKS IN LEFT ARM  . CAD (coronary artery disease)    a. 01/19/2010 s/p CABG x 3, lima->lad, vg->diag, vg->om1;  b. 06/2011 :Lexi MV: EF 84%, No ischemia. c. 01/06/14 s/p negative nuclear stress test with EF >70%  . Cervical arthritis   . Colonic polyp 04-27-2009   tubular adenoma  . Diverticulosis of colon (without mention of hemorrhage)   . Fibromyalgia   . Gallstones   . GERD (gastroesophageal reflux disease)   . Glaucoma   . Headache(784.0)    a. frequently assocaited with high BPs  . Hiatal hernia   . Hypercholesterolemia   . Irritable bowel syndrome   . Labile hypertension     Past Surgical History:  Procedure Laterality Date  . [place] STUDY N/A 01/08/2016   Procedure: [place] STUDY;  Surgeon: Ladene Artist, MD;  Location: [facility];  Service: Endoscopy;  Laterality: N/A;  . BREAST LUMPECTOMY Left 02/06/11  . CATARACT EXTRACTION, BILATERAL     bilateral caaract removal,  . CORONARY ANGIOPLASTY WITH STENT PLACEMENT     Stent 2007  . CORONARY ARTERY BYPASS GRAFT    . ESOPHAGEAL MANOMETRY N/A 01/08/2016   Procedure: ESOPHAGEAL MANOMETRY (EM);  Surgeon:  Ladene Artist, MD;  Location: [facility];  Service: Endoscopy;  Laterality: N/A;  . open heart surgery     01/19/2010    There were no vitals filed for this visit.  Subjective Assessment - 08/21/17 1350    Subjective  Pt reported she did not want to try traction again. Pt feels like her pain is better since begining therapy but reported her pain was bad this morning when she woke up.     Pertinent History  HTN, h/o CA, recent bone density (unsure of results)    Limitations  Standing;Sitting;Walking    How long can you sit comfortably?  1 hour    Currently in Pain?  Yes    Pain Score  4     Pain Location  Back    Pain Orientation  Right    Pain Descriptors / Indicators  Aching;Dull    Pain Type  Chronic pain    Pain Radiating Towards  R LE to foot/ankle    Pain Onset  More than a month ago                       [facility] Adult PT Treatment/Exercise - 08/21/17 0001      Knee/Hip Exercises: Stretches   Active Hamstring Stretch  Right;3 reps;30 seconds    Piriformis Stretch  Right;2 reps;30 seconds      Knee/Hip Exercises: Aerobic   Nustep  L 4 6 min      Modalities   Modalities  Electrical Stimulation;Moist Heat      Moist Heat Therapy   Number Minutes Moist Heat  12 Minutes    Moist Heat Location  Lumbar Spine      Electrical Stimulation   Electrical Stimulation Location  LB    Electrical Stimulation Action  IFC    Electrical Stimulation Parameters  80-150 Hz x 12 minutes, intensity to tolerance sitting     Electrical Stimulation Goals  Pain      Manual Therapy   Manual Therapy  Passive ROM;Manual Traction;Other (comment)    Passive ROM  LE and trunk    Manual Traction  R LE distraction 3 pulls holding 60 seconds with rest in between               PT Short Term Goals - 08/14/17 1442      PT SHORT TERM GOAL #1   Title  pt will be I with initial HEP      Status  Partially Met        PT Long Term Goals - 08/05/17 1202      PT LONG TERM  GOAL #1   Title  Pt I with advanced HEP    Time  6    Period  Weeks    Status  New    Target Date  10/28/17      PT LONG TERM GOAL #2   Title  Patient to report decreased frequency of RLE pain by 50% or more with ADLS.    Time  6    Period  Weeks    Status  New      PT LONG TERM GOAL #3   Title  Patient to report decreased intensity of back and LE pain by 50% or more with ADLS.    Time  6    Period  Weeks    Status  New      PT LONG TERM GOAL #4   Title  Patient to demo improved strength in RLE strength including R ankle eversion to 4+/5    Time  6    Period  Weeks    Status  New      PT LONG TERM GOAL #5   Title  -----------------------------------            Plan - 08/21/17 1355    Clinical Impression Statement  Pt tolerating exercises and stretches well today. R LE distraction performed.  Pt reporting relief after session of 2/10 pain in her low back and R LE. Continue to progress toward goals set.     Rehab Potential  Good    PT Frequency  2x / week    PT Treatment/Interventions  ADLs/Self Care Home Management;Cryotherapy;Electrical Stimulation;Moist Heat;Traction;Therapeutic exercise;Neuromuscular re-education;Patient/family education;Manual techniques;Dry needling;Taping    PT Next Visit Plan  Assess and progress. Increase HEP    PT Home Exercise Plan  seated piriformis and HS stretch    Consulted and Agree with Plan of Care  Patient       Patient will benefit from skilled therapeutic intervention in order to improve the following deficits and impairments:  Decreased activity tolerance, Decreased strength, Pain, Decreased range of motion, Impaired flexibility  Visit Diagnosis: Radiculopathy, lumbar region  Muscle weakness (generalized)     Problem List  Patient Active Problem List   Diagnosis Date Noted  . S/P CABG (coronary artery bypass graft) 12/19/2016  . Abdominal pain, epigastric 04/11/2016  . Nausea 04/11/2016  . Hypercontractile esophagus  03/19/2016  . Belching 01/03/2016  . Bloating 05/23/2015  . Diarrhea 05/23/2015  . Heartburn 05/23/2015  . Bilateral occipital neuralgia 11/23/2014  . Cervicogenic headache 11/23/2014  . Neck pain 11/23/2014  . OSA on CPAP 11/23/2014  . Generalized anxiety disorder 02/04/2014  . Labile hypertension   . Fibromyalgia   . Cervical spondylolysis 12/24/2013  . CAD (coronary artery disease) 12/24/2013  . Hypertensive emergency 12/23/2013  . Acute encephalopathy 12/23/2013  . Bronchitis, chronic obstructive w acute bronchitis ([place]) 12/21/2013  . Diverticulosis of colon without hemorrhage 11/15/2013  . Hot flashes related to aromatase inhibitor therapy 07/27/2013  . Osteopenia 07/27/2013  . DJD (degenerative joint disease) 09/29/2012  . Breast cancer, Left 12/20/2010  . Leg pain 11/28/2010  . FATTY LIVER DISEASE 01/09/2010  . GASTRITIS 12/16/2007  . Allergic rhinitis 02/18/2007  . DIZZINESS, CHRONIC 02/18/2007  . Headache in back of head 02/18/2007  . Mixed hyperlipidemia 12/23/2006  . GLAUCOMA 12/23/2006  . Coronary atherosclerosis 12/23/2006  . Asthma 12/23/2006  . Gastroesophageal reflux disease 12/23/2006  . IRRITABLE BOWEL SYNDROME 12/23/2006  . Myalgia and myositis 12/23/2006    Oretha Caprice, MPT 08/21/2017, 1:58 PM  [place] [address] [address] [place], [place], [address] Phone: [phone]   Fax:  [phone]  Name: [name] MRN: [id number] Date of Birth: 16-May-1935

## 2017-08-26 ENCOUNTER — Ambulatory Visit: Payer: Medicare Other | Admitting: Physical Therapy

## 2017-09-02 ENCOUNTER — Other Ambulatory Visit: Payer: Self-pay | Admitting: Cardiovascular Disease

## 2017-09-02 ENCOUNTER — Ambulatory Visit: Payer: Medicare Other | Attending: Neurology | Admitting: Physical Therapy

## 2017-09-02 ENCOUNTER — Encounter: Payer: Self-pay | Admitting: Physical Therapy

## 2017-09-02 DIAGNOSIS — M542 Cervicalgia: Secondary | ICD-10-CM

## 2017-09-02 DIAGNOSIS — R293 Abnormal posture: Secondary | ICD-10-CM | POA: Diagnosis not present

## 2017-09-02 DIAGNOSIS — I890 Lymphedema, not elsewhere classified: Secondary | ICD-10-CM | POA: Diagnosis not present

## 2017-09-02 DIAGNOSIS — M5416 Radiculopathy, lumbar region: Secondary | ICD-10-CM | POA: Diagnosis not present

## 2017-09-02 DIAGNOSIS — M545 Low back pain, unspecified: Secondary | ICD-10-CM

## 2017-09-02 DIAGNOSIS — M6281 Muscle weakness (generalized): Secondary | ICD-10-CM

## 2017-09-02 DIAGNOSIS — M62838 Other muscle spasm: Secondary | ICD-10-CM

## 2017-09-02 DIAGNOSIS — M5382 Other specified dorsopathies, cervical region: Secondary | ICD-10-CM | POA: Diagnosis not present

## 2017-09-02 NOTE — Therapy (Signed)
[name], [place], [address] Phone: [phone]   Fax:  [phone]  Physical Therapy Treatment  Patient Details  Name: [name] MRN: [id number] Date of Birth: Feb 12, 1936 Referring Provider: Ellouise Newer MD   Encounter Date: 09/02/2017  PT End of Session - 09/02/17 1403    Visit Number  5    Date for PT Re-Evaluation  09/30/17    PT Start Time  1315    PT Stop Time  1415    PT Time Calculation (min)  60 min       Past Medical History:  Diagnosis Date  . Asthma   . Atrophic vaginitis   . Breast cancer, Left 12/20/2010   NO BLOOD PRESSURE CHECKS OR STICKS IN LEFT ARM  . CAD (coronary artery disease)    a. 01/19/2010 s/p CABG x 3, lima->lad, vg->diag, vg->om1;  b. 06/2011 :Lexi MV: EF 84%, No ischemia. c. 01/06/14 s/p negative nuclear stress test with EF >70%  . Cervical arthritis   . Colonic polyp 04-27-2009   tubular adenoma  . Diverticulosis of colon (without mention of hemorrhage)   . Fibromyalgia   . Gallstones   . GERD (gastroesophageal reflux disease)   . Glaucoma   . Headache(784.0)    a. frequently assocaited with high BPs  . Hiatal hernia   . Hypercholesterolemia   . Irritable bowel syndrome   . Labile hypertension     Past Surgical History:  Procedure Laterality Date  . [place] STUDY N/A 01/08/2016   Procedure: [place] STUDY;  Surgeon: Ladene Artist, MD;  Location: [facility];  Service: Endoscopy;  Laterality: N/A;  . BREAST LUMPECTOMY Left 02/06/11  . CATARACT EXTRACTION, BILATERAL     bilateral caaract removal,  . CORONARY ANGIOPLASTY WITH STENT PLACEMENT     Stent 2007  . CORONARY ARTERY BYPASS GRAFT    . ESOPHAGEAL MANOMETRY N/A 01/08/2016   Procedure: ESOPHAGEAL MANOMETRY (EM);  Surgeon: Ladene Artist, MD;  Location: [facility];  Service: Endoscopy;  Laterality: N/A;  . open heart surgery     01/19/2010    There were no vitals filed for  this visit.  Subjective Assessment - 09/02/17 1317    Subjective  Pt reports a catch in her lower back and rt hip region. Noted that she was working in her garden yesterday which may have made it worse.    Currently in Pain?  Yes    Pain Score  5     Pain Location  Back    Pain Orientation  Lower;Right                       [facility] Adult PT Treatment/Exercise - 09/02/17 0001      Knee/Hip Exercises: Stretches   Active Hamstring Stretch  Right;3 reps;30 seconds    Piriformis Stretch  Right;2 reps;30 seconds      Knee/Hip Exercises: Aerobic   Nustep  L 4 6 min      Knee/Hip Exercises: Machines for Strengthening   Cybex Knee Extension  5# 2 sets 12    Cybex Knee Flexion  15# 2 sets 12      Knee/Hip Exercises: Standing   Hip Abduction  Right;2 sets;15 reps Red theraband anchored low    Hip Extension  2 sets;15 reps;Other (comment);Right Red Theraband anchored low    Gait Training  Yellow Banded Side Stepping Laterally 2 x down  and back      Knee/Hip Exercises: Supine   Other Supine Knee/Hip Exercises  Active Straight Leg Raise Rt side only with 3lb DB 2 x 10       Moist Heat Therapy   Number Minutes Moist Heat  15 Minutes    Moist Heat Location  Lumbar Spine      Electrical Stimulation   Electrical Stimulation Location  LB    Electrical Stimulation Action  IFC    Electrical Stimulation Parameters  Supine    Electrical Stimulation Goals  Pain               PT Short Term Goals - 08/14/17 1442      PT SHORT TERM GOAL #1   Title  pt will be I with initial HEP      Status  Partially Met        PT Long Term Goals - 08/05/17 1202      PT LONG TERM GOAL #1   Title  Pt I with advanced HEP    Time  6    Period  Weeks    Status  New    Target Date  10/28/17      PT LONG TERM GOAL #2   Title  Patient to report decreased frequency of RLE pain by 50% or more with ADLS.    Time  6    Period  Weeks    Status  New      PT LONG TERM GOAL #3   Title   Patient to report decreased intensity of back and LE pain by 50% or more with ADLS.    Time  6    Period  Weeks    Status  New      PT LONG TERM GOAL #4   Title  Patient to demo improved strength in RLE strength including R ankle eversion to 4+/5    Time  6    Period  Weeks    Status  New      PT LONG TERM GOAL #5   Title  -----------------------------------            Plan - 09/02/17 1404    Clinical Impression Statement  Pt had no radiculating pain today and tolerated increased exercise volume well with no increase in pain. Pt improved with a focus on hip mobility and strength.    PT Treatment/Interventions  ADLs/Self Care Home Management;Cryotherapy;Electrical Stimulation;Moist Heat;Traction;Therapeutic exercise;Neuromuscular re-education;Patient/family education;Manual techniques;Dry needling;Taping    PT Next Visit Plan  Assess and progress. Increase HEP       Patient will benefit from skilled therapeutic intervention in order to improve the following deficits and impairments:  Decreased activity tolerance, Decreased strength, Pain, Decreased range of motion, Impaired flexibility  Visit Diagnosis: Radiculopathy, lumbar region  Muscle weakness (generalized)  Lymphedema, not elsewhere classified  Neck pain  Muscle spasms of neck  Decreased ROM of intervertebral discs of cervical spine  Abnormal posture  Acute bilateral low back pain without sciatica     Problem List Patient Active Problem List   Diagnosis Date Noted  . S/P CABG (coronary artery bypass graft) 12/19/2016  . Abdominal pain, epigastric 04/11/2016  . Nausea 04/11/2016  . 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  . Belching 01/03/2016  . Bloating 05/23/2015  . Diarrhea 05/23/2015  . Heartburn 05/23/2015  . Bilateral occipital neuralgia 11/23/2014  . Cervicogenic headache 11/23/2014  . Neck pain 11/23/2014  . OSA on CPAP 11/23/2014  . Generalized anxiety disorder 02/04/2014  .  Labile  hypertension   . Fibromyalgia   . Cervical spondylolysis 12/24/2013  . CAD (coronary artery disease) 12/24/2013  . Hypertensive emergency 12/23/2013  . Acute encephalopathy 12/23/2013  . Bronchitis, chronic obstructive w acute bronchitis ([place]) 12/21/2013  . Diverticulosis of colon without hemorrhage 11/15/2013  . Hot flashes related to aromatase inhibitor therapy 07/27/2013  . Osteopenia 07/27/2013  . DJD (degenerative joint disease) 09/29/2012  . Breast cancer, Left 12/20/2010  . Leg pain 11/28/2010  . FATTY LIVER DISEASE 01/09/2010  . GASTRITIS 12/16/2007  . Allergic rhinitis 02/18/2007  . DIZZINESS, CHRONIC 02/18/2007  . Headache in back of head 02/18/2007  . Mixed hyperlipidemia 12/23/2006  . GLAUCOMA 12/23/2006  . Coronary atherosclerosis 12/23/2006  . Asthma 12/23/2006  . Gastroesophageal reflux disease 12/23/2006  . IRRITABLE BOWEL SYNDROME 12/23/2006  . Myalgia and myositis 12/23/2006   STEPHEN CLINE, SPTA Casmir Auguste,ANGIE PTA 09/02/2017, 2:10 PM  [address] [place], [place], [address] Phone: [phone]   Fax:  [phone]  Name: GABRELLE ROCA MRN: [id number] Date of Birth: Jul 13, 1935

## 2017-09-04 ENCOUNTER — Ambulatory Visit: Payer: Medicare Other | Admitting: Physical Therapy

## 2017-09-04 DIAGNOSIS — M6281 Muscle weakness (generalized): Secondary | ICD-10-CM | POA: Diagnosis not present

## 2017-09-04 DIAGNOSIS — I890 Lymphedema, not elsewhere classified: Secondary | ICD-10-CM | POA: Diagnosis not present

## 2017-09-04 DIAGNOSIS — M542 Cervicalgia: Secondary | ICD-10-CM | POA: Diagnosis not present

## 2017-09-04 DIAGNOSIS — M62838 Other muscle spasm: Secondary | ICD-10-CM | POA: Diagnosis not present

## 2017-09-04 DIAGNOSIS — M5416 Radiculopathy, lumbar region: Secondary | ICD-10-CM

## 2017-09-04 DIAGNOSIS — M5382 Other specified dorsopathies, cervical region: Secondary | ICD-10-CM | POA: Diagnosis not present

## 2017-09-04 NOTE — Therapy (Signed)
[name], [place], [address] Phone: [phone]   Fax:  [phone]  Physical Therapy Treatment  Patient Details  Name: [name] MRN: [id number] Date of Birth: [birth year] Referring Provider: Ellouise Newer MD   Encounter Date: 09/04/2017  PT End of Session - 09/04/17 1208    Visit Number  6    Date for PT Re-Evaluation  09/30/17    PT Start Time  1140    PT Stop Time  1235    PT Time Calculation (min)  55 min       Past Medical History:  Diagnosis Date  . Asthma   . Atrophic vaginitis   . Breast cancer, Left 12/20/2010   NO BLOOD PRESSURE CHECKS OR STICKS IN LEFT ARM  . CAD (coronary artery disease)    a. 01/19/2010 s/p CABG x 3, lima->lad, vg->diag, vg->om1;  b. 06/2011 :Lexi MV: EF 84%, No ischemia. c. 01/06/14 s/p negative nuclear stress test with EF >70%  . Cervical arthritis   . Colonic polyp 04-27-2009   tubular adenoma  . Diverticulosis of colon (without mention of hemorrhage)   . Fibromyalgia   . Gallstones   . GERD (gastroesophageal reflux disease)   . Glaucoma   . Headache(784.0)    a. frequently assocaited with high BPs  . Hiatal hernia   . Hypercholesterolemia   . Irritable bowel syndrome   . Labile hypertension     Past Surgical History:  Procedure Laterality Date  . [place] STUDY N/A 01/08/2016   Procedure: [place] STUDY;  Surgeon: Ladene Artist, MD;  Location: [facility];  Service: Endoscopy;  Laterality: N/A;  . BREAST LUMPECTOMY Left 02/06/11  . CATARACT EXTRACTION, BILATERAL     bilateral caaract removal,  . CORONARY ANGIOPLASTY WITH STENT PLACEMENT     Stent 2007  . CORONARY ARTERY BYPASS GRAFT    . ESOPHAGEAL MANOMETRY N/A 01/08/2016   Procedure: ESOPHAGEAL MANOMETRY (EM);  Surgeon: Ladene Artist, MD;  Location: [facility];  Service: Endoscopy;  Laterality: N/A;  . open heart surgery     01/19/2010    There were no vitals filed for  this visit.  Subjective Assessment - 09/04/17 1204    Subjective  not good, been sore since last session- feels like leg just needs pulled off    Limitations  Standing;Sitting;Walking    Currently in Pain?  Yes    Pain Score  7     Pain Location  Hip                       [facility] Adult PT Treatment/Exercise - 09/04/17 0001      Modalities   Modalities  Electrical Stimulation;Moist Heat;Iontophoresis      Moist Heat Therapy   Number Minutes Moist Heat  15 Minutes    Moist Heat Location  Lumbar Spine RT glut and lat hip      Electrical Stimulation   Electrical Stimulation Location  LB    Electrical Stimulation Action  IFC    Electrical Stimulation Parameters  supine    Electrical Stimulation Goals  Pain      Iontophoresis   Type of Iontophoresis  Dexamethasone    Location  RT SI    Dose  1.2cc    Time  4 mA patch      Manual Therapy   Manual Therapy  Soft tissue mobilization;Manual Traction;Passive ROM;Muscle Energy Technique  Manual therapy comments  very tender over RT SI, and ITB    Soft tissue mobilization  RT glut/SI,Hip and ITB RT ITB stripping    Passive ROM  LE and trunk    Manual Traction  RT LE distraction    Muscle Energy Technique  =SI when checked               PT Short Term Goals - 08/14/17 1442      PT SHORT TERM GOAL #1   Title  pt will be I with initial HEP      Status  Partially Met        PT Long Term Goals - 09/04/17 1210      PT LONG TERM GOAL #1   Title  Pt I with advanced HEP    Status  Partially Met      PT LONG TERM GOAL #2   Title  Patient to report decreased frequency of RLE pain by 50% or more with ADLS.    Status  Partially Met      PT LONG TERM GOAL #3   Title  Patient to report decreased intensity of back and LE pain by 50% or more with ADLS.    Status  Partially Met      PT LONG TERM GOAL #4   Title  Patient to demo improved strength in RLE strength including R ankle eversion to 4+/5    Status   Partially Met            Plan - 09/04/17 1208    Clinical Impression Statement  pt with increased pain at arrival , focus on PROM and STW- very tight and tener in RT SI/GT and ITB- good ROM with stretching. adjusted location of estim to addresses areas with STW and trial of ionto at [facility] which is whee most pain is originating at but no alignment issues when tested    PT Treatment/Interventions  ADLs/Self Care Home Management;Cryotherapy;Electrical Stimulation;Moist Heat;Traction;Therapeutic exercise;Neuromuscular re-education;Patient/family education;Manual techniques;Dry needling;Taping    PT Next Visit Plan  Assess and progress. Increase HEP       Patient will benefit from skilled therapeutic intervention in order to improve the following deficits and impairments:  Decreased activity tolerance, Decreased strength, Pain, Decreased range of motion, Impaired flexibility  Visit Diagnosis: Radiculopathy, lumbar region  Muscle weakness (generalized)     Problem List Patient Active Problem List   Diagnosis Date Noted  . S/P CABG (coronary artery bypass graft) 12/19/2016  . Abdominal pain, epigastric 04/11/2016  . Nausea 04/11/2016  . 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  . Belching 01/03/2016  . Bloating 05/23/2015  . Diarrhea 05/23/2015  . Heartburn 05/23/2015  . Bilateral occipital neuralgia 11/23/2014  . Cervicogenic headache 11/23/2014  . Neck pain 11/23/2014  . OSA on CPAP 11/23/2014  . Generalized anxiety disorder 02/04/2014  . Labile hypertension   . Fibromyalgia   . Cervical spondylolysis 12/24/2013  . CAD (coronary artery disease) 12/24/2013  . Hypertensive emergency 12/23/2013  . Acute encephalopathy 12/23/2013  . Bronchitis, chronic obstructive w acute bronchitis ([place]) 12/21/2013  . Diverticulosis of colon without hemorrhage 11/15/2013  . Hot flashes related to aromatase inhibitor therapy 07/27/2013  . Osteopenia 07/27/2013  . DJD (degenerative joint disease)  09/29/2012  . Breast cancer, Left 12/20/2010  . Leg pain 11/28/2010  . FATTY LIVER DISEASE 01/09/2010  . GASTRITIS 12/16/2007  . Allergic rhinitis 02/18/2007  . DIZZINESS, CHRONIC 02/18/2007  . Headache in back of head 02/18/2007  . Mixed hyperlipidemia  12/23/2006  . GLAUCOMA 12/23/2006  . Coronary atherosclerosis 12/23/2006  . Asthma 12/23/2006  . Gastroesophageal reflux disease 12/23/2006  . IRRITABLE BOWEL SYNDROME 12/23/2006  . Myalgia and myositis 12/23/2006    Willim Turnage,ANGIE PTA 09/04/2017, 12:11 PM  [place] [address] [place], [place], [address] Phone: [phone]   Fax:  [phone]  Name: NOVELLE ADDAIR MRN: [id number] Date of Birth: [birth year]

## 2017-09-09 ENCOUNTER — Encounter: Payer: Self-pay | Admitting: Physical Therapy

## 2017-09-09 ENCOUNTER — Ambulatory Visit: Payer: Medicare Other | Admitting: Physical Therapy

## 2017-09-09 DIAGNOSIS — M5382 Other specified dorsopathies, cervical region: Secondary | ICD-10-CM | POA: Diagnosis not present

## 2017-09-09 DIAGNOSIS — M5416 Radiculopathy, lumbar region: Secondary | ICD-10-CM | POA: Diagnosis not present

## 2017-09-09 DIAGNOSIS — M6281 Muscle weakness (generalized): Secondary | ICD-10-CM

## 2017-09-09 DIAGNOSIS — I890 Lymphedema, not elsewhere classified: Secondary | ICD-10-CM | POA: Diagnosis not present

## 2017-09-09 DIAGNOSIS — M62838 Other muscle spasm: Secondary | ICD-10-CM | POA: Diagnosis not present

## 2017-09-09 DIAGNOSIS — M542 Cervicalgia: Secondary | ICD-10-CM | POA: Diagnosis not present

## 2017-09-09 NOTE — Therapy (Signed)
[place], [place], [address] Phone: [phone]   Fax:  [phone]  Physical Therapy Treatment  Patient Details  Name: [name] MRN: [id number] Date of Birth: [birth year] Referring Provider: Ellouise Newer MD   Encounter Date: 09/09/2017  PT End of Session - 09/09/17 1250    Visit Number  7    Date for PT Re-Evaluation  09/30/17    PT Start Time  1225    PT Stop Time  1320    PT Time Calculation (min)  55 min       Past Medical History:  Diagnosis Date  . Asthma   . Atrophic vaginitis   . Breast cancer, Left 12/20/2010   NO BLOOD PRESSURE CHECKS OR STICKS IN LEFT ARM  . CAD (coronary artery disease)    a. 01/19/2010 s/p CABG x 3, lima->lad, vg->diag, vg->om1;  b. 06/2011 :Lexi MV: EF 84%, No ischemia. c. 01/06/14 s/p negative nuclear stress test with EF >70%  . Cervical arthritis   . Colonic polyp 04-27-2009   tubular adenoma  . Diverticulosis of colon (without mention of hemorrhage)   . Fibromyalgia   . Gallstones   . GERD (gastroesophageal reflux disease)   . Glaucoma   . Headache(784.0)    a. frequently assocaited with high BPs  . Hiatal hernia   . Hypercholesterolemia   . Irritable bowel syndrome   . Labile hypertension     Past Surgical History:  Procedure Laterality Date  . [place] STUDY N/A 01/08/2016   Procedure: [place] STUDY;  Surgeon: Ladene Artist, MD;  [facility]: [facility];  Service: Endoscopy;  Laterality: N/A;  . BREAST LUMPECTOMY Left 02/06/11  . CATARACT EXTRACTION, BILATERAL     bilateral caaract removal,  . CORONARY ANGIOPLASTY WITH STENT PLACEMENT     Stent 2007  . CORONARY ARTERY BYPASS GRAFT    . ESOPHAGEAL MANOMETRY N/A 01/08/2016   Procedure: ESOPHAGEAL MANOMETRY (EM);  Surgeon: Ladene Artist, MD;  [facility]: [facility];  Service: Endoscopy;  Laterality: N/A;  . open heart surgery     01/19/2010    There were no vitals filed for  this visit.  Subjective Assessment - 09/09/17 1248    Subjective  last session really helped    Currently in Pain?  Yes    Pain Location  Hip    Pain Orientation  Right                       [facility] Adult PT Treatment/Exercise - 09/09/17 0001      Moist Heat Therapy   Number Minutes Moist Heat  15 Minutes    Moist Heat Location  Lumbar Spine;Hip      Electrical Stimulation   Electrical Stimulation Location  LB    Electrical Stimulation Action  IFC    Electrical Stimulation Parameters  SL    Electrical Stimulation Goals  Pain      Iontophoresis   Type of Iontophoresis  Dexamethasone    Location  RT SI    Dose  1.2cc    Time  4 mA patch      Manual Therapy   Manual Therapy  Soft tissue mobilization;Manual Traction;Passive ROM;Muscle Energy Technique    Manual therapy comments  very tender over RT SI, and ITB tolerated session better today     Soft tissue mobilization  RT glut/SI,Hip and ITB RT IT stripping  Passive ROM  LE and trunk    Manual Traction  RT LE distraction             PT Education - 09/09/17 1255    Education Details  SL ITB stretch    Person(s) Educated  Patient    Methods  Explanation;Demonstration    Comprehension  Verbalized understanding;Returned demonstration       PT Short Term Goals - 09/09/17 1254      PT SHORT TERM GOAL #1   Status  Achieved        PT Long Term Goals - 09/09/17 1254      PT LONG TERM GOAL #1   Title  Pt I with advanced HEP    Status  Partially Met      PT LONG TERM GOAL #2   Title  Patient to report decreased frequency of RLE pain by 50% or more with ADLS.    Status  Partially Met      PT LONG TERM GOAL #3   Title  Patient to report decreased intensity of back and LE pain by 50% or more with ADLS.    Status  Partially Met      PT LONG TERM GOAL #4   Title  Patient to demo improved strength in RLE strength including R ankle eversion to 4+/5    Status  Achieved            Plan -  09/09/17 1250    Clinical Impression Statement  pt with good LE and trunk ROM, minimal limitations IR supine. prone STW in RT SI with LE rotation. tenderness over RT SI but good alignment-relief with manual distraction. tight ITB but overall responding well to tx and she feels we are on the right path    PT Treatment/Interventions  ADLs/Self Care Home Management;Cryotherapy;Electrical Stimulation;Moist Heat;Traction;Therapeutic exercise;Neuromuscular re-education;Patient/family education;Manual techniques;Dry needling;Taping    PT Next Visit Plan  Assess and progress. Increase HEP       Patient will benefit from skilled therapeutic intervention in order to improve the following deficits and impairments:  Decreased activity tolerance, Decreased strength, Pain, Decreased range of motion, Impaired flexibility  Visit Diagnosis: Radiculopathy, lumbar region  Muscle weakness (generalized)     Problem List Patient Active Problem List   Diagnosis Date Noted  . S/P CABG (coronary artery bypass graft) 12/19/2016  . Abdominal pain, epigastric 04/11/2016  . Nausea 04/11/2016  . 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  . Belching 01/03/2016  . Bloating 05/23/2015  . Diarrhea 05/23/2015  . Heartburn 05/23/2015  . Bilateral occipital neuralgia 11/23/2014  . Cervicogenic headache 11/23/2014  . Neck pain 11/23/2014  . OSA on CPAP 11/23/2014  . Generalized anxiety disorder 02/04/2014  . Labile hypertension   . Fibromyalgia   . Cervical spondylolysis 12/24/2013  . CAD (coronary artery disease) 12/24/2013  . Hypertensive emergency 12/23/2013  . Acute encephalopathy 12/23/2013  . Bronchitis, chronic obstructive w acute bronchitis ([place]) 12/21/2013  . Diverticulosis of colon without hemorrhage 11/15/2013  . Hot flashes related to aromatase inhibitor therapy 07/27/2013  . Osteopenia 07/27/2013  . DJD (degenerative joint disease) 09/29/2012  . Breast cancer, Left 12/20/2010  . Leg pain 11/28/2010   . FATTY LIVER DISEASE 01/09/2010  . GASTRITIS 12/16/2007  . Allergic rhinitis 02/18/2007  . DIZZINESS, CHRONIC 02/18/2007  . Headache in back of head 02/18/2007  . Mixed hyperlipidemia 12/23/2006  . GLAUCOMA 12/23/2006  . Coronary atherosclerosis 12/23/2006  . Asthma 12/23/2006  . Gastroesophageal reflux disease 12/23/2006  . IRRITABLE  BOWEL SYNDROME 12/23/2006  . Myalgia and myositis 12/23/2006    Rissa Turley,ANGIE PTA 09/09/2017, 12:56 PM  [place] [address] [place], [place], [address] Phone: [phone]   Fax:  [phone]  Name: [name] MRN: [id number] Date of Birth: 1936-01-04

## 2017-09-16 ENCOUNTER — Ambulatory Visit: Payer: Medicare Other | Admitting: Physical Therapy

## 2017-09-16 DIAGNOSIS — M6281 Muscle weakness (generalized): Secondary | ICD-10-CM

## 2017-09-16 DIAGNOSIS — M5382 Other specified dorsopathies, cervical region: Secondary | ICD-10-CM | POA: Diagnosis not present

## 2017-09-16 DIAGNOSIS — M542 Cervicalgia: Secondary | ICD-10-CM | POA: Diagnosis not present

## 2017-09-16 DIAGNOSIS — M62838 Other muscle spasm: Secondary | ICD-10-CM | POA: Diagnosis not present

## 2017-09-16 DIAGNOSIS — M5416 Radiculopathy, lumbar region: Secondary | ICD-10-CM

## 2017-09-16 DIAGNOSIS — I890 Lymphedema, not elsewhere classified: Secondary | ICD-10-CM | POA: Diagnosis not present

## 2017-09-16 NOTE — Therapy (Signed)
[facility] [place], [place], [address] Phone: [phone]   Fax:  [phone]  Physical Therapy Treatment  Patient Details  Name: [name] MRN: [id number] Date of Birth: Jun 06, 1935 Referring Provider: Ellouise Newer MD   Encounter Date: 09/16/2017  PT End of Session - 09/16/17 1314    Visit Number  8    Date for PT Re-Evaluation  09/30/17    PT Start Time  1230    PT Stop Time  1325    PT Time Calculation (min)  55 min       Past Medical History:  Diagnosis Date  . Asthma   . Atrophic vaginitis   . Breast cancer, Left 12/20/2010   NO BLOOD PRESSURE CHECKS OR STICKS IN LEFT ARM  . CAD (coronary artery disease)    a. 01/19/2010 s/p CABG x 3, lima->lad, vg->diag, vg->om1;  b. 06/2011 :Lexi MV: EF 84%, No ischemia. c. 01/06/14 s/p negative nuclear stress test with EF >70%  . Cervical arthritis   . Colonic polyp 04-27-2009   tubular adenoma  . Diverticulosis of colon (without mention of hemorrhage)   . Fibromyalgia   . Gallstones   . GERD (gastroesophageal reflux disease)   . Glaucoma   . Headache(784.0)    a. frequently assocaited with high BPs  . Hiatal hernia   . Hypercholesterolemia   . Irritable bowel syndrome   . Labile hypertension     Past Surgical History:  Procedure Laterality Date  . [place] STUDY N/A 01/08/2016   Procedure: [place] STUDY;  Surgeon: Ladene Artist, MD;  Location: [facility];  Service: Endoscopy;  Laterality: N/A;  . BREAST LUMPECTOMY Left 02/06/11  . CATARACT EXTRACTION, BILATERAL     bilateral caaract removal,  . CORONARY ANGIOPLASTY WITH STENT PLACEMENT     Stent 2007  . CORONARY ARTERY BYPASS GRAFT    . ESOPHAGEAL MANOMETRY N/A 01/08/2016   Procedure: ESOPHAGEAL MANOMETRY (EM);  Surgeon: Ladene Artist, MD;  Location: [facility];  Service: Endoscopy;  Laterality: N/A;  . open heart surgery     01/19/2010    There were no vitals filed for  this visit.  Subjective Assessment - 09/16/17 1310    Subjective  just not any better. BIL LE RT greater left weakness and pain, c/o groin pain at times    Currently in Pain?  Yes    Pain Score  7     Pain Location  Hip    Pain Orientation  Right                       [facility] Adult PT Treatment/Exercise - 09/16/17 0001      Moist Heat Therapy   Number Minutes Moist Heat  15 Minutes    Moist Heat Location  Lumbar Spine;Hip      Electrical Stimulation   Electrical Stimulation Location  hip/buttock    Electrical Stimulation Action  IFC    Electrical Stimulation Goals  Pain      Manual Therapy   Manual Therapy  Soft tissue mobilization;Manual Traction;Passive ROM;Muscle Energy Technique    Manual therapy comments  painful over RT ischial tub    Soft tissue mobilization  RT LE, ischial tub    Passive ROM  LE and trunk    Manual Traction  RT LE distraction    Muscle Energy Technique  = SI  PT Short Term Goals - 09/09/17 1254      PT SHORT TERM GOAL #1   Status  Achieved        PT Long Term Goals - 09/16/17 1316      PT LONG TERM GOAL #1   Title  Pt I with advanced HEP    Status  Partially Met      PT LONG TERM GOAL #2   Title  Patient to report decreased frequency of RLE pain by 50% or more with ADLS.    Status  Partially Met      PT LONG TERM GOAL #3   Title  Patient to report decreased intensity of back and LE pain by 50% or more with ADLS.    Status  Partially Met            Plan - 09/16/17 1315    Clinical Impression Statement  RT LE minimal tightness, pain an dtender over RT ischial tub and IT- occassional groin pain and knee pain _ question if issue is hip??     PT Treatment/Interventions  ADLs/Self Care Home Management;Cryotherapy;Electrical Stimulation;Moist Heat;Traction;Therapeutic exercise;Neuromuscular re-education;Patient/family education;Manual techniques;Dry needling;Taping    PT Next Visit Plan  rec pt call  and get f/u appoint with MD d/t symptoms not improving and now questioning if hip is involved       Patient will benefit from skilled therapeutic intervention in order to improve the following deficits and impairments:  Decreased activity tolerance, Decreased strength, Pain, Decreased range of motion, Impaired flexibility  Visit Diagnosis: Radiculopathy, lumbar region  Muscle weakness (generalized)     Problem List Patient Active Problem List   Diagnosis Date Noted  . S/P CABG (coronary artery bypass graft) 12/19/2016  . Abdominal pain, epigastric 04/11/2016  . Nausea 04/11/2016  . 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  . Belching 01/03/2016  . Bloating 05/23/2015  . Diarrhea 05/23/2015  . Heartburn 05/23/2015  . Bilateral occipital neuralgia 11/23/2014  . Cervicogenic headache 11/23/2014  . Neck pain 11/23/2014  . OSA on CPAP 11/23/2014  . Generalized anxiety disorder 02/04/2014  . Labile hypertension   . Fibromyalgia   . Cervical spondylolysis 12/24/2013  . CAD (coronary artery disease) 12/24/2013  . Hypertensive emergency 12/23/2013  . Acute encephalopathy 12/23/2013  . Bronchitis, chronic obstructive w acute bronchitis ([place]) 12/21/2013  . Diverticulosis of colon without hemorrhage 11/15/2013  . Hot flashes related to aromatase inhibitor therapy 07/27/2013  . Osteopenia 07/27/2013  . DJD (degenerative joint disease) 09/29/2012  . Breast cancer, Left 12/20/2010  . Leg pain 11/28/2010  . FATTY LIVER DISEASE 01/09/2010  . GASTRITIS 12/16/2007  . Allergic rhinitis 02/18/2007  . DIZZINESS, CHRONIC 02/18/2007  . Headache in back of head 02/18/2007  . Mixed hyperlipidemia 12/23/2006  . GLAUCOMA 12/23/2006  . Coronary atherosclerosis 12/23/2006  . Asthma 12/23/2006  . Gastroesophageal reflux disease 12/23/2006  . IRRITABLE BOWEL SYNDROME 12/23/2006  . Myalgia and myositis 12/23/2006    Valetta Mulroy,ANGIE PTA 09/16/2017, 1:17 PM  [facility], [place], [address] Phone: [phone]   Fax:  [phone]  Name: [name] MRN: [id number] Date of Birth: 11-04-1935

## 2017-09-17 ENCOUNTER — Telehealth: Payer: Self-pay | Admitting: Neurology

## 2017-09-17 NOTE — Telephone Encounter (Signed)
Patient called to let Dr. Delice Lesch know that her Physical Therapist said they are really not getting anywhere. They are thinking it could be her Hip. She also has started having new symptoms. She will be in on 09/18/2017. Thanks

## 2017-09-18 ENCOUNTER — Ambulatory Visit: Payer: Medicare Other | Admitting: Physical Therapy

## 2017-09-18 ENCOUNTER — Ambulatory Visit (INDEPENDENT_AMBULATORY_CARE_PROVIDER_SITE_OTHER): Payer: Medicare Other | Admitting: Neurology

## 2017-09-18 ENCOUNTER — Other Ambulatory Visit: Payer: Self-pay

## 2017-09-18 ENCOUNTER — Encounter: Payer: Self-pay | Admitting: Neurology

## 2017-09-18 ENCOUNTER — Ambulatory Visit
Admission: RE | Admit: 2017-09-18 | Discharge: 2017-09-18 | Disposition: A | Discharge status: Home / Self Care | Payer: Medicare Other | Source: Ambulatory Visit | Attending: Neurology | Admitting: Neurology

## 2017-09-18 VITALS — BP 130/68 | HR 67 | Ht <= 58 in | Wt 136.0 lb

## 2017-09-18 DIAGNOSIS — G5601 Carpal tunnel syndrome, right upper limb: Secondary | ICD-10-CM

## 2017-09-18 DIAGNOSIS — M25551 Pain in right hip: Secondary | ICD-10-CM | POA: Diagnosis not present

## 2017-09-18 DIAGNOSIS — R51 Headache: Secondary | ICD-10-CM | POA: Diagnosis not present

## 2017-09-18 DIAGNOSIS — G4486 Cervicogenic headache: Secondary | ICD-10-CM

## 2017-09-18 DIAGNOSIS — M1611 Unilateral primary osteoarthritis, right hip: Secondary | ICD-10-CM | POA: Diagnosis not present

## 2017-09-18 DIAGNOSIS — I2510 Atherosclerotic heart disease of native coronary artery without angina pectoris: Secondary | ICD-10-CM

## 2017-09-18 DIAGNOSIS — J3089 Other allergic rhinitis: Secondary | ICD-10-CM | POA: Diagnosis not present

## 2017-09-18 DIAGNOSIS — J301 Allergic rhinitis due to pollen: Secondary | ICD-10-CM | POA: Diagnosis not present

## 2017-09-18 MED ORDER — CARPAL TUNNEL WRIST STABILIZER MISC: 0 refills | Status: DC

## 2017-09-18 NOTE — Progress Notes (Addendum)
NEUROLOGY FOLLOW UP OFFICE NOTE  [name] [id number]  DOB: [birth year]  HISTORY OF PRESENT ILLNESS: I had the pleasure of seeing [name] in follow-up in the neurology clinic on 09/18/2017.  The patient was last seen 3 months ago for headaches and neck pain. She presents for an earlier visit due to back/hip pain. She reports pain from the right hip and lower back, down to her buttock. No numbness/tingling/weakness, bowel/bladder dysfunction. She had an EMG of the right LE in May 2019 which showed chronic right S1 radiculopathy, but pain today appears to be more around her right hip. No recent falls. She continues to note numbness in her right hand, EMG/NCV of the right hand showed moderate carpal tunnel syndrome. She also has neck pain, which is likely the cause of headaches. Headaches are fair, she takes prn Tylenol then Tramadol if more severe. She reports occasional memory issues, sometimes she "could not think," for instance she went to the cabinet and could not think of what she went for. She denies getting lost driving, no missed bills or medications.   HPI: This is a pleasant 82 yo RH woman with a history of hypertension, hyperlipidemia, CAD, breast cancer s/p lumpectomy, chemotherapy and radiation, with daily headaches. She had been seeing neurologist Dr. Krista Blue since 2014. Records were reviewed. [name] reports a history of migraines since her 110s. These quieted down, until 2014 when she started having headaches that she reports were different from her migraines in the past. It starts in the bilateral occipital region, her neck feels strange, "like it's about to break in the middle," then radiates to the frontal region like a tight band of pressure. This is associated with nausea. Her last migraine was 1 year ago. She was reporting 15 headaches days a month. MRI cervical spine had shown multilevel degenerative disc disease, mild right foraminal stenosis at C3-4 secondary to  asymmetric right-sided facet hypertrophy and uncovertebral disease, facet hypertrophy and disc osteophyte complexes at C4-5 with mild central canal narrowing, mild to moderate left central canal stenosis C5-6 without significant foraminal disease, mild central and left foraminal stenosis at C6-7 secondary to a broad-based disc osteophyte complex and uncovertebral disease, mild left foraminal narrowing at C7-C1 secondary to facet disease. ESR normal. In 2015, she was admitted for hyponatremia and headaches. Headaches had increased in frequency, described as a burning and pressure sensation. Ultram would help some. She was noted to have elevated blood pressure during times of headaches. According to notes, on her last visit in August 2016, she had reported improvement in the headaches but still frequent, and was offered the option of an occipital nerve block.   She reports having the headaches constantly, waxing and waning in intensity from 3/10 to 10/10. She takes Tramadol or Arthritis Tylenol, applying ice helps. She chews ginger for the nausea. In the past, seeing her chiropractor used to help, but not anymore. She had PT for neck pain in 2015 and was told her neck was so tight. She feels slightly better lying down. She has had balance problems but denies any recent falls in the past year. She reports that her BP is overall better except this week when she has had more intense headaches. She started using a CPAP machine last June but has not noticed much change and still does not feel rested on awakening.   PAST MEDICAL HISTORY: Past Medical History:  Diagnosis Date  . Asthma   . Atrophic vaginitis   . Breast cancer,  Left 12/20/2010   NO BLOOD PRESSURE CHECKS OR STICKS IN LEFT ARM  . CAD (coronary artery disease)    a. 01/19/2010 s/p CABG x 3, lima->lad, vg->diag, vg->om1;  b. 06/2011 :Lexi MV: EF 84%, No ischemia. c. 01/06/14 s/p negative nuclear stress test with EF >70%  . Cervical arthritis   .  Colonic polyp 04-27-2009   tubular adenoma  . Diverticulosis of colon (without mention of hemorrhage)   . Fibromyalgia   . Gallstones   . GERD (gastroesophageal reflux disease)   . Glaucoma   . Headache(784.0)    a. frequently assocaited with high BPs  . Hiatal hernia   . Hypercholesterolemia   . Irritable bowel syndrome   . Labile hypertension     MEDICATIONS: Current Outpatient Medications on File Prior to Visit  Medication Sig Dispense Refill  . Acetaminophen (TYLENOL 8 HOUR PO) Take by mouth as directed.     [name] albuterol (VENTOLIN HFA) 108 (90 Base) MCG/ACT inhaler Inhale 2 puffs into the lungs every 6 (six) hours as needed for wheezing or shortness of breath.    [name] amLODipine (NORVASC) 5 MG tablet TAKE 1 TABLET BY MOUTH ONCE DAILY 90 tablet 1  . aspirin EC 81 MG tablet Take 81 mg by mouth daily.    . Calcium & Magnesium Carbonates (MYLANTA PO) Take by mouth. 1-2 teaspoons as needed    . CARAFATE 1 GM/10ML suspension take 10 milliliters by mouth four times a day with meals and at bedtime 420 mL 2  . fexofenadine (ALLEGRA) 180 MG tablet Take 180 mg by mouth daily.      . fluocinonide (LIDEX) 0.05 % external solution Apply 1 application topically daily as needed (scalp itching).     . Glucosamine-Chondroit-Vit C-Mn (GLUCOSAMINE CHONDR 1500 COMPLX PO) Take 1 tablet by mouth 3 (three) times daily.      . hydrALAZINE (APRESOLINE) 50 MG tablet TAKE 1/2 TABLET BY MOUTH THREE TIMES DAILY 45 tablet 1  . ketoconazole (NIZORAL) 2 % shampoo Apply 1 application topically 2 (two) times a week. As needed for itching a couple of times week    . latanoprost (XALATAN) 0.005 % ophthalmic solution Place 1 drop into both eyes at bedtime.     [name] losartan (COZAAR) 100 MG tablet take 1/2 tablet by mouth three times a day 45 tablet 5  . magnesium oxide (MAG-OX) 400 MG tablet Take 400 mg by mouth daily. Reported on 02/14/2015    . metoprolol tartrate (LOPRESSOR) 50 MG tablet take 1 tablet by mouth three times a  day 270 tablet 3  . mometasone (NASONEX) 50 MCG/ACT nasal spray Place 2 sprays into the nose daily as needed (congestion).     . nitroGLYCERIN (NITROSTAT) 0.4 MG SL tablet Place 1 tablet (0.4 mg total) under the tongue every 5 (five) minutes as needed for chest pain. 25 tablet 2  . Omega-3 Fatty Acids (FISH OIL) 1200 MG CAPS Take 1,200 mg by mouth daily. Reported on 02/14/2015    . pantoprazole (PROTONIX) 40 MG tablet Take 1 tablet (40 mg total) by mouth daily. 30 tablet 0  . Psyllium (METAMUCIL FIBER PO) Take 1 tablet by mouth daily.     . ranitidine (ZANTAC) 300 MG capsule Take 1 capsule (300 mg total) by mouth every evening. 30 capsule 3  . rosuvastatin (CRESTOR) 5 MG tablet TAKE 1 TABLET BY MOUTH 3 TIMES A WEEK. (TAKE ON MONDAY, WEDNESDAY, AND FRIDAY) 30 tablet 5  . traMADol (ULTRAM) 50 MG tablet Take  1/2 to 1 tablet by mouth three times daily as needed 90 tablet 5  . vitamin B-12 (CYANOCOBALAMIN) 1000 MCG tablet Take 1,000 mcg by mouth daily.       No current facility-administered medications on file prior to visit.     ALLERGIES: Allergies  Allergen Reactions  . Levaquin [Levofloxacin In D5w]     Elevated BP  . Choline Fenofibrate Other (See Comments)     pt states INTOL to Trilipix w/ "thigh burning"  . Hctz [Hydrochlorothiazide]     Causes hyponatremia  . Simvastatin Other (See Comments)     pt states INTOL to STATINS' \\T'$ \ refuses to restart  . Adhesive [Tape] Rash  . Bentyl [Dicyclomine Hcl]     Made me feel weird and drained me  . Ceclor [Cefaclor] Rash  . Clarithromycin Rash  . Codeine Nausea Only  . Doxycycline Rash  . Hydrocodone     Nightmare after taking cough syrup w/hydrocodone  . Lisinopril Cough    Developed ACE cough...  . Penicillins Itching and Rash    At injection site  . Tobramycin-Dexamethasone Rash    FAMILY HISTORY: Family History  Problem Relation Age of Onset  . Stroke Mother   . Stroke Father   . Prostate cancer Father   . Breast cancer  Sister   . Diabetes Sister   . Cancer Brother        gland cancer  . Heart disease Brother   . Colon cancer Neg Hx   . Stomach cancer Neg Hx   . Pancreatic cancer Neg Hx   . Kidney disease Neg Hx   . Liver disease Neg Hx     SOCIAL HISTORY: Social History   Socioeconomic History  . Marital status: Married    Spouse name: Gildardo Griffes. Yanko  . Number of children: 2  . Years of education: 10  . Highest education level: Not on file  Occupational History  . Occupation: part time preschool teacher-retired    Employer: [employer]  . Financial resource strain: Not on file  . Food insecurity:    Worry: Not on file    Inability: Not on file  . Transportation needs:    Medical: Not on file    Non-medical: Not on file  Tobacco Use  . Smoking status: Never Smoker  . Smokeless tobacco: Never Used  Substance and Sexual Activity  . Alcohol use: No    Alcohol/week: 0.0 oz  . Drug use: No  . Sexual activity: Never    Partners: Male    Birth control/protection: Post-menopausal  Lifestyle  . Physical activity:    Days per week: Not on file    Minutes per session: Not on file  . Stress: Not on file  Relationships  . Social connections:    Talks on phone: Not on file    Gets together: Not on file    Attends religious service: Not on file    Active member of club or organization: Not on file    Attends meetings of clubs or organizations: Not on file    Relationship status: Not on file  . Intimate partner violence:    Fear of current or ex partner: Not on file    Emotionally abused: Not on file    Physically abused: Not on file    Forced sexual activity: Not on file  Other Topics Concern  . Not on file  Social History Narrative   Patient lives at home  with her husband Marcello Moores). Patient is retired. Patient  Has 12 th grade education.    Caffeine- sometimes- One cup of coffee.   Right handed.   Four granddaughters.    REVIEW OF  SYSTEMS: Constitutional: No fevers, chills, or sweats, no generalized fatigue, change in appetite Eyes: No visual changes, double vision, eye pain Ear, nose and throat: No hearing loss, ear pain, nasal congestion, sore throat Cardiovascular: No chest pain, palpitations Respiratory:  No shortness of breath at rest or with exertion, wheezes GastrointestinaI: No nausea, vomiting, diarrhea, abdominal pain, fecal incontinence Genitourinary:  No dysuria, urinary retention or frequency Musculoskeletal:  + neck pain, back pain Integumentary: No rash, pruritus, skin lesions Neurological: as above Psychiatric: No depression, insomnia, anxiety Endocrine: No palpitations, fatigue, diaphoresis, mood swings, change in appetite, change in weight, increased thirst Hematologic/Lymphatic:  No anemia, purpura, petechiae. Allergic/Immunologic: no itchy/runny eyes, nasal congestion, recent allergic reactions, rashes  PHYSICAL EXAM: Vitals:   09/18/17 0952  BP: 130/68  Pulse: 67  SpO2: 98%   General: No acute distress Head:  Normocephalic/atraumatic, no occipital tenderness Neck: supple, + paraspinal tenderness, full range of motion Heart:  Regular rate and rhythm Lungs:  Clear to auscultation bilaterally Back: No paraspinal tenderness Skin/Extremities: No rash, no edema Neurological Exam: alert and oriented to person, place, and time. No aphasia or dysarthria. Fund of knowledge is appropriate.  Recent and remote memory are intact.  Attention and concentration are normal.    Able to name objects and repeat phrases. CDT 5/5 MMSE - Mini Mental State Exam 09/18/2017/2019  Orientation to time 5  Orientation to Place 5  Registration 3  Attention/ Calculation 4  Recall 3  Language- name 2 objects 2  Language- repeat 1  Language- follow 3 step command 3  Language- read & follow direction 1  Write a sentence 1  Copy design 1  Total score 29   Cranial nerves: Pupils equal, round, reactive to light.   Extraocular movements intact with no nystagmus. Visual fields full. Facial sensation intact. No facial asymmetry. Tongue, uvula, palate midline.  Motor: Bulk and tone normal, muscle strength 5/5 throughout except for 4/5 bilateral APB, no pronator drift.  Sensation to light touch intact, cold, pin. Decreased vibration to ankles bilaterally.  No extinction to double simultaneous stimulation.  Deep tendon reflexes +1 throughout, toes downgoing.  Finger to nose testing intact.  Gait slow and cautious reporting right hip pain, unable to tandem walk.  Romberg negative.   IMPRESSION: This is a pleasant 82 yo RH woman with a history of hypertension, sleep apnea, migraines, who was having chronic daily headaches over the occipital and neck regions. She did not have much response to occipital nerve blocks but had good response to PT and dry needling for neck pain, suggestive of cervicogenic headaches. Headaches and neck pain are overall better. She presents for an earlier visit due to back and right hip pain. Prior EMG had shown chronic right S1 radiculopathy, however pain today appears to be more in the hip, xray will be ordered and referral to Ortho will be sent. We discussed using a wrist splint for the carpal tunnel syndrome, if no improvement, she will let us know if she would like to proceed with seeing Hand Surgery. She reports memory loss, MMSE today normal, 29/30, continue to monitor. We discussed the importance of control of vascular risk factors, physical exercise, and brain stimulation exercises for brain health. She will follow-up in 1 year and knows to call for any  changes.   Thank you for allowing me to participate in her care.  Please do not hesitate to call for any questions or concerns.  The duration of this appointment visit was 27 minutes of face-to-face time with the patient.  Greater than 50% of this time was spent in counseling, explanation of diagnosis, planning of further management, and  coordination of care.   Ellouise Newer, M.D.   CC: Dr. Lenna Gilford

## 2017-09-18 NOTE — Patient Instructions (Signed)
1. Let's do right hip xray 2. Refer to Ortho for right hip pain 3. Start using wrist brace on right. If no improvement, we can send referral to Hand surgeon 4. Minimize Tylenol and Tramadol intake to 2-3 times a week 5. Follow-up in 1 year, call for any changes

## 2017-09-19 ENCOUNTER — Telehealth: Payer: Self-pay | Admitting: Neurology

## 2017-09-19 NOTE — Telephone Encounter (Signed)
Patient made aware of results.  Referral faxed to [facility] at [phone].

## 2017-09-19 NOTE — Telephone Encounter (Signed)
-----   Message from Cameron Sprang, MD sent at 09/19/2017 10:07 AM EDT ----- Pls let her know the xray confirmed moderate arthritis in the right hip, proceed with Ortho consult. Thanks

## 2017-09-22 NOTE — Telephone Encounter (Signed)
Received notice from [facility] - pt scheduled for August 1, at 2:15pm

## 2017-09-24 DIAGNOSIS — J3089 Other allergic rhinitis: Secondary | ICD-10-CM | POA: Diagnosis not present

## 2017-09-24 DIAGNOSIS — J301 Allergic rhinitis due to pollen: Secondary | ICD-10-CM | POA: Diagnosis not present

## 2017-09-25 DIAGNOSIS — M545 Low back pain, unspecified: Secondary | ICD-10-CM | POA: Insufficient documentation

## 2017-09-25 DIAGNOSIS — M169 Osteoarthritis of hip, unspecified: Secondary | ICD-10-CM | POA: Insufficient documentation

## 2017-09-25 DIAGNOSIS — M1611 Unilateral primary osteoarthritis, right hip: Secondary | ICD-10-CM | POA: Diagnosis not present

## 2017-09-29 ENCOUNTER — Encounter: Payer: Self-pay | Admitting: Neurology

## 2017-10-07 ENCOUNTER — Ambulatory Visit: Payer: Medicare Other | Attending: Orthopedic Surgery | Admitting: Physical Therapy

## 2017-10-07 ENCOUNTER — Encounter: Payer: Self-pay | Admitting: Physical Therapy

## 2017-10-07 DIAGNOSIS — M6281 Muscle weakness (generalized): Secondary | ICD-10-CM

## 2017-10-07 DIAGNOSIS — M5416 Radiculopathy, lumbar region: Secondary | ICD-10-CM | POA: Diagnosis not present

## 2017-10-07 DIAGNOSIS — J3089 Other allergic rhinitis: Secondary | ICD-10-CM | POA: Diagnosis not present

## 2017-10-07 DIAGNOSIS — J301 Allergic rhinitis due to pollen: Secondary | ICD-10-CM | POA: Diagnosis not present

## 2017-10-07 NOTE — Therapy (Signed)
[address] [place], [place], [address] Phone: [phone]   Fax:  [phone]  Physical Therapy Evaluation  Patient Details  Name: [name] MRN: [id number] Date of Birth: 1935-10-22 Referring Provider: Rod Can   Encounter Date: 10/07/2017  PT End of Session - 10/07/17 1648    Visit Number  9    Date for PT Re-Evaluation  11/07/17    Authorization Type  MCR; add KX    PT Start Time  1446    PT Stop Time  1535    PT Time Calculation (min)  49 min    Activity Tolerance  Patient tolerated treatment well    Behavior During Therapy  [facility] for tasks assessed/performed       Past Medical History:  Diagnosis Date  . Asthma   . Atrophic vaginitis   . Breast cancer, Left 12/20/2010   NO BLOOD PRESSURE CHECKS OR STICKS IN LEFT ARM  . CAD (coronary artery disease)    a. 01/19/2010 s/p CABG x 3, lima->lad, vg->diag, vg->om1;  b. 06/2011 :Lexi MV: EF 84%, No ischemia. c. 01/06/14 s/p negative nuclear stress test with EF >70%  . Cervical arthritis   . Colonic polyp 04-27-2009   tubular adenoma  . Diverticulosis of colon (without mention of hemorrhage)   . Fibromyalgia   . Gallstones   . GERD (gastroesophageal reflux disease)   . Glaucoma   . Headache(784.0)    a. frequently assocaited with high BPs  . Hiatal hernia   . Hypercholesterolemia   . Irritable bowel syndrome   . Labile hypertension     Past Surgical History:  Procedure Laterality Date  . [place] STUDY N/A 01/08/2016   Procedure: [place] STUDY;  Surgeon: Ladene Artist, MD;  [facility]: [facility];  Service: Endoscopy;  Laterality: N/A;  . BREAST LUMPECTOMY Left 02/06/11  . CATARACT EXTRACTION, BILATERAL     bilateral caaract removal,  . CORONARY ANGIOPLASTY WITH STENT PLACEMENT     Stent 2007  . CORONARY ARTERY BYPASS GRAFT    . ESOPHAGEAL MANOMETRY N/A 01/08/2016   Procedure: ESOPHAGEAL MANOMETRY (EM);  Surgeon:  Ladene Artist, MD;  [facility];  Service: Endoscopy;  Laterality: N/A;  . open heart surgery     01/19/2010    There were no vitals filed for this visit.   Subjective Assessment - 10/07/17 1452    Subjective  Patient has not been in to [place] in 3 weeks, we saw her 8 visits and she really was thinking that she was not all that better, we went her back to her MD and she had x-rays of the hip that shows some moderate arthritic changes.  She was sent to [facility] and the MD felt like it was coming from the back as the hip looked good.  She returns today with new order from orthopedic MD    Currently in Pain?  Yes    Pain Score  5     Pain Location  Buttocks    Pain Orientation  Right;Lower    Pain Descriptors / Indicators  Aching    Pain Radiating Towards  pain in the right leg    Aggravating Factors   standing incresaes pain, lifting pain can be a 9/10    Pain Relieving Factors  lie down, take Tylenol reports at best pain a 4/10         [facility] PT Assessment - 10/07/17  0001      Assessment   Medical Diagnosis  LBP    Referring Provider  Swinteck, Aaron Edelman      Strength   Right Hip ABduction  4/5      Flexibility   Hamstrings  mod tightness bil      Palpation   Palpation comment  has a knot in the right SI that is tender and painful, has spasms in the lumbar area      Special Tests   Other special tests  + mild + SLR on R, - slump R                 Objective measurements completed on examination: See above findings.      [facility] Adult PT Treatment/Exercise - 10/07/17 0001      Electrical Stimulation   Electrical Stimulation Location  estim/US combo    Electrical Stimulation Parameters  left sidelying    Electrical Stimulation Goals  Pain      Manual Therapy   Manual Therapy  Soft tissue mobilization    Soft tissue mobilization  right SI area and into the right buttock with her in left sidelying               PT Short Term Goals - 09/09/17  1254      PT SHORT TERM GOAL #1   Status  Achieved        PT Long Term Goals - 10/07/17 1659      PT LONG TERM GOAL #1   Title  Pt I with advanced HEP    Status  Partially Met      PT LONG TERM GOAL #2   Title  Patient to report decreased frequency of RLE pain by 50% or more with ADLS.    Status  Partially Met      PT LONG TERM GOAL #3   Title  Patient to report decreased intensity of back and LE pain by 50% or more with ADLS.    Status  Partially Met      PT LONG TERM GOAL #4   Title  Patient to demo improved strength in RLE strength including R ankle eversion to 4+/5    Status  Partially Met             Plan - 10/07/17 1649    Clinical Impression Statement  Patient just saw different MD about her back and leg pain issues.  She really has tenderness in the right SI area with some knots palpable.  The hip x-rays showed some arthritis.  she reports that she felt that maybe the manual distraction helped.    PT Frequency  2x / week    PT Duration  4 weeks    PT Treatment/Interventions  ADLs/Self Care Home Management;Cryotherapy;Electrical Stimulation;Moist Heat;Traction;Therapeutic exercise;Neuromuscular re-education;Patient/family education;Manual techniques;Dry needling;Taping    PT Next Visit Plan  I would like to try traction again and assure she is using good body mechanics, may try DN over the knots in the right SI as well    Consulted and Agree with Plan of Care  Patient       Patient will benefit from skilled therapeutic intervention in order to improve the following deficits and impairments:  Decreased activity tolerance, Decreased strength, Pain, Decreased range of motion, Impaired flexibility  Visit Diagnosis: Radiculopathy, lumbar region - Plan: PT plan of care cert/re-cert  Muscle weakness (generalized) - Plan: PT plan of care cert/re-cert     Problem List Patient  Active Problem List   Diagnosis Date Noted  . S/P CABG (coronary artery bypass graft)  12/19/2016  . Abdominal pain, epigastric 04/11/2016  . Nausea 04/11/2016  . 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  . Belching 01/03/2016  . Bloating 05/23/2015  . Diarrhea 05/23/2015  . Heartburn 05/23/2015  . Bilateral occipital neuralgia 11/23/2014  . Cervicogenic headache 11/23/2014  . Neck pain 11/23/2014  . OSA on CPAP 11/23/2014  . Generalized anxiety disorder 02/04/2014  . Labile hypertension   . Fibromyalgia   . Cervical spondylolysis 12/24/2013  . CAD (coronary artery disease) 12/24/2013  . Hypertensive emergency 12/23/2013  . Acute encephalopathy 12/23/2013  . Bronchitis, chronic obstructive w acute bronchitis ([place]) 12/21/2013  . Diverticulosis of colon without hemorrhage 11/15/2013  . Hot flashes related to aromatase inhibitor therapy 07/27/2013  . Osteopenia 07/27/2013  . DJD (degenerative joint disease) 09/29/2012  . Breast cancer, Left 12/20/2010  . Leg pain 11/28/2010  . FATTY LIVER DISEASE 01/09/2010  . GASTRITIS 12/16/2007  . Allergic rhinitis 02/18/2007  . DIZZINESS, CHRONIC 02/18/2007  . Headache in back of head 02/18/2007  . Mixed hyperlipidemia 12/23/2006  . GLAUCOMA 12/23/2006  . Coronary atherosclerosis 12/23/2006  . Asthma 12/23/2006  . Gastroesophageal reflux disease 12/23/2006  . IRRITABLE BOWEL SYNDROME 12/23/2006  . Myalgia and myositis 12/23/2006    Sumner Boast., PT 10/07/2017, 5:01 PM  [address] [place], [place], [address] Phone: [phone]   Fax:  [phone]  Name: [name] MRN: [id number] Date of Birth: 08-13-1935

## 2017-10-13 ENCOUNTER — Ambulatory Visit: Payer: Medicare Other | Admitting: Physical Therapy

## 2017-10-13 ENCOUNTER — Ambulatory Visit: Payer: Medicare Other

## 2017-10-13 DIAGNOSIS — M6281 Muscle weakness (generalized): Secondary | ICD-10-CM

## 2017-10-13 DIAGNOSIS — M5416 Radiculopathy, lumbar region: Secondary | ICD-10-CM

## 2017-10-13 NOTE — Therapy (Signed)
[address] [place], [place], [address] Phone: [phone]   Fax:  [phone]  Physical Therapy Treatment  Patient Details  Name: [name] MRN: [id number] Date of Birth: 08/25/1935 Referring Provider: Rod Can   Encounter Date: 10/13/2017   Physical Therapy Progress Note  Dates of Reporting Period: 08/05/17 to 10/13/17     PT End of Session - 10/13/17 1303    Visit Number  10    Date for PT Re-Evaluation  11/07/17    Authorization Type  MCR; add KX    PT Start Time  0105    PT Stop Time  0212   long traction set up   PT Time Calculation (min)  67 min       Past Medical History:  Diagnosis Date  . Asthma   . Atrophic vaginitis   . Breast cancer, Left 12/20/2010   NO BLOOD PRESSURE CHECKS OR STICKS IN LEFT ARM  . CAD (coronary artery disease)    a. 01/19/2010 s/p CABG x 3, lima->lad, vg->diag, vg->om1;  b. 06/2011 :Lexi MV: EF 84%, No ischemia. c. 01/06/14 s/p negative nuclear stress test with EF >70%  . Cervical arthritis   . Colonic polyp 04-27-2009   tubular adenoma  . Diverticulosis of colon (without mention of hemorrhage)   . Fibromyalgia   . Gallstones   . GERD (gastroesophageal reflux disease)   . Glaucoma   . Headache(784.0)    a. frequently assocaited with high BPs  . Hiatal hernia   . Hypercholesterolemia   . Irritable bowel syndrome   . Labile hypertension     Past Surgical History:  Procedure Laterality Date  . [place] STUDY N/A 01/08/2016   Procedure: [place] STUDY;  Surgeon: Ladene Artist, MD;  [facility];  Service: Endoscopy;  Laterality: N/A;  . BREAST LUMPECTOMY Left 02/06/11  . CATARACT EXTRACTION, BILATERAL     bilateral caaract removal,  . CORONARY ANGIOPLASTY WITH STENT PLACEMENT     Stent 2007  . CORONARY ARTERY BYPASS GRAFT    . ESOPHAGEAL MANOMETRY N/A 01/08/2016   Procedure: ESOPHAGEAL MANOMETRY (EM);  Surgeon: Ladene Artist, MD;  [facility];  Service: Endoscopy;  Laterality: N/A;  . open heart surgery     01/19/2010    There were no vitals filed for this visit.  Subjective Assessment - 10/13/17 1304    Subjective   Patient gets some relief from treatment but then pain comes back.     Pertinent History  HTN, h/o CA, recent bone density (unsure of results)    Limitations  Standing;Sitting;Walking    Patient Stated Goals  to decrease pain    Currently in Pain?  Yes    Pain Score  3     Pain Location  Buttocks    Pain Orientation  Right;Lower    Pain Descriptors / Indicators  Aching    Pain Type  Chronic pain    Pain Radiating Towards  also reports numbness in her buttock and leg    Pain Onset  More than a month ago         [facility] PT Assessment - 10/13/17 0001      Observation/Other Assessments   Focus on Therapeutic Outcomes (FOTO)   43% limited                   [facility] Adult PT Treatment/Exercise - 10/13/17 0001  Self-Care   Self-Care  Other Self-Care Comments    Other Self-Care Comments   Discussed current POC, DN education and aftercare      Knee/Hip Exercises: Aerobic   Nustep  L4x8 min      Modalities   Modalities  Traction      Traction   Type of Traction  Lumbar    Min (lbs)  15    Max (lbs)  45    Hold Time  60    Rest Time  10    Time  15      Manual Therapy   Manual Therapy  Soft tissue mobilization;Myofascial release    Soft tissue mobilization  to R gluteals and piriformis    Myofascial Release  along R SIJ and to gluteals       Trigger Point Dry Needling - 10/13/17 1558    Consent Given?  Yes    Education Handout Provided  Yes    Muscles Treated Lower Body  Gluteus minimus;Gluteus maximus;Piriformis   RIGHT and gluteus med   Gluteus Maximus Response  Twitch response elicited;Palpable increased muscle length    Gluteus Minimus Response  Twitch response elicited;Palpable increased muscle length    Piriformis Response  Twitch response  elicited;Palpable increased muscle length             PT Short Term Goals - 09/09/17 1254      PT SHORT TERM GOAL #1   Status  Achieved        PT Long Term Goals - 10/13/17 1314      PT LONG TERM GOAL #1   Title  Pt I with advanced HEP    Time  6    Period  Weeks    Status  Partially Met      PT LONG TERM GOAL #2   Title  Patient to report decreased frequency of RLE pain by 50% or more with ADLS.    Status  Partially Met      PT LONG TERM GOAL #3   Title  Patient to report decreased intensity of back and LE pain by 50% or more with ADLS.    Baseline  25% better; has stopped the "hot wire" feeling    Status  Partially Met      PT LONG TERM GOAL #4   Title  Patient to demo improved strength in RLE strength including R ankle eversion to 4+/5    Baseline  5/5 on 10/13/17    Status  Achieved            Plan - 10/13/17 1606    Clinical Impression Statement  Patient reports 25% improvement overall. She has met one long term goal and partially met the others. She responded well to DN to her hip muscles after giving consent. Traction was attempted again at 45#.     PT Treatment/Interventions  ADLs/Self Care Home Management;Cryotherapy;Electrical Stimulation;Moist Heat;Traction;Therapeutic exercise;Neuromuscular re-education;Patient/family education;Manual techniques;Dry needling;Taping    PT Next Visit Plan  assure she is using good body mechanics, assess DN over the knots in the right SI as well       Patient will benefit from skilled therapeutic intervention in order to improve the following deficits and impairments:  Decreased activity tolerance, Decreased strength, Pain, Decreased range of motion, Impaired flexibility  Visit Diagnosis: Radiculopathy, lumbar region  Muscle weakness (generalized)     Problem List Patient Active Problem List   Diagnosis Date Noted  . S/P CABG (coronary artery bypass graft)  12/19/2016  . Abdominal pain, epigastric 04/11/2016   . Nausea 04/11/2016  . 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  . Belching 01/03/2016  . Bloating 05/23/2015  . Diarrhea 05/23/2015  . Heartburn 05/23/2015  . Bilateral occipital neuralgia 11/23/2014  . Cervicogenic headache 11/23/2014  . Neck pain 11/23/2014  . OSA on CPAP 11/23/2014  . Generalized anxiety disorder 02/04/2014  . Labile hypertension   . Fibromyalgia   . Cervical spondylolysis 12/24/2013  . CAD (coronary artery disease) 12/24/2013  . Hypertensive emergency 12/23/2013  . Acute encephalopathy 12/23/2013  . Bronchitis, chronic obstructive w acute bronchitis ([place]) 12/21/2013  . Diverticulosis of colon without hemorrhage 11/15/2013  . Hot flashes related to aromatase inhibitor therapy 07/27/2013  . Osteopenia 07/27/2013  . DJD (degenerative joint disease) 09/29/2012  . Breast cancer, Left 12/20/2010  . Leg pain 11/28/2010  . FATTY LIVER DISEASE 01/09/2010  . GASTRITIS 12/16/2007  . Allergic rhinitis 02/18/2007  . DIZZINESS, CHRONIC 02/18/2007  . Headache in back of head 02/18/2007  . Mixed hyperlipidemia 12/23/2006  . GLAUCOMA 12/23/2006  . Coronary atherosclerosis 12/23/2006  . Asthma 12/23/2006  . Gastroesophageal reflux disease 12/23/2006  . IRRITABLE BOWEL SYNDROME 12/23/2006  . Myalgia and myositis 12/23/2006    Lashala Laser PT 10/13/2017, 4:11 PM  [address] [place], [place], [address] Phone: [phone]   Fax:  [phone]  Name: [name] MRN: [id number] Date of Birth: [birth year]

## 2017-10-13 NOTE — Patient Instructions (Signed)
Trigger Point Dry Needling  . What is Trigger Point Dry Needling (DN)? o DN is a physical therapy technique used to treat muscle pain and dysfunction. Specifically, DN helps deactivate muscle trigger points (muscle knots).  o A thin filiform needle is used to penetrate the skin and stimulate the underlying trigger point. The goal is for a local twitch response (LTR) to occur and for the trigger point to relax. No medication of any kind is injected during the procedure.   . What Does Trigger Point Dry Needling Feel Like?  o The procedure feels different for each individual patient. Some patients report that they do not actually feel the needle enter the skin and overall the process is not painful. Very mild bleeding may occur. However, many patients feel a deep cramping in the muscle in which the needle was inserted. This is the local twitch response.   [name] How Will I feel after the treatment? o Soreness is normal, and the onset of soreness may not occur for a few hours. Typically this soreness does not last longer than two days.  o Bruising is uncommon, however; ice can be used to decrease any possible bruising.  o In rare cases feeling tired or nauseous after the treatment is normal. In addition, your symptoms may get worse before they get better, this period will typically not last longer than 24 hours.   . What Can I do After My Treatment? o Increase your hydration by drinking more water for the next 24 hours. o You may place ice or heat on the areas treated that have become sore, however, do not use heat on inflamed or bruised areas. Heat often brings more relief post needling. o You can continue your regular activities, but vigorous activity is not recommended initially after the treatment for 24 hours. o DN is best combined with other physical therapy such as strengthening, stretching, and other therapies.    Precautions:  In some cases, dry needling is done over the lung field. While rare,  there is a risk of pneumothorax (punctured lung). Because of this, if you ever experience shortness of breath on exertion, difficulty taking a deep breath, chest pain or a dry cough following dry needling, you should report to an emergency room and tell them that you have been dry needled over the thorax.   Madelyn Flavors, PT 10/13/17 4:01 PM

## 2017-10-16 ENCOUNTER — Ambulatory Visit: Payer: Medicare Other | Admitting: Physical Therapy

## 2017-10-21 ENCOUNTER — Ambulatory Visit: Payer: Medicare Other | Admitting: Physical Therapy

## 2017-10-21 DIAGNOSIS — M5416 Radiculopathy, lumbar region: Secondary | ICD-10-CM | POA: Diagnosis not present

## 2017-10-21 DIAGNOSIS — M6281 Muscle weakness (generalized): Secondary | ICD-10-CM | POA: Diagnosis not present

## 2017-10-21 NOTE — Therapy (Signed)
[address] [address] [place], [place], [address] Phone: [phone]   Fax:  [phone]  Physical Therapy Treatment  Patient Details  Name: [name] MRN: [id number] Date of Birth: December 25, 1935 Referring Provider: Rod Can   Encounter Date: 10/21/2017  PT End of Session - 10/21/17 1257    Visit Number  11    Date for PT Re-Evaluation  11/07/17    Authorization Type  MCR; add KX    PT Start Time  1300    PT Stop Time  1347    PT Time Calculation (min)  47 min    Activity Tolerance  Patient tolerated treatment well    Behavior During Therapy  [facility] for tasks assessed/performed       Past Medical History:  Diagnosis Date  . Asthma   . Atrophic vaginitis   . Breast cancer, Left 12/20/2010   NO BLOOD PRESSURE CHECKS OR STICKS IN LEFT ARM  . CAD (coronary artery disease)    a. 01/19/2010 s/p CABG x 3, lima->lad, vg->diag, vg->om1;  b. 06/2011 :Lexi MV: EF 84%, No ischemia. c. 01/06/14 s/p negative nuclear stress test with EF >70%  . Cervical arthritis   . Colonic polyp 04-27-2009   tubular adenoma  . Diverticulosis of colon (without mention of hemorrhage)   . Fibromyalgia   . Gallstones   . GERD (gastroesophageal reflux disease)   . Glaucoma   . Headache(784.0)    a. frequently assocaited with high BPs  . Hiatal hernia   . Hypercholesterolemia   . Irritable bowel syndrome   . Labile hypertension     Past Surgical History:  Procedure Laterality Date  . [place] STUDY N/A 01/08/2016   Procedure: [place] STUDY;  Surgeon: Ladene Artist, MD;  [facility];  Service: Endoscopy;  Laterality: N/A;  . BREAST LUMPECTOMY Left 02/06/11  . CATARACT EXTRACTION, BILATERAL     bilateral caaract removal,  . CORONARY ANGIOPLASTY WITH STENT PLACEMENT     Stent 2007  . CORONARY ARTERY BYPASS GRAFT    . ESOPHAGEAL MANOMETRY N/A 01/08/2016   Procedure: ESOPHAGEAL MANOMETRY (EM);  Surgeon:  Ladene Artist, MD;  [facility];  Service: Endoscopy;  Laterality: N/A;  . open heart surgery     01/19/2010    There were no vitals filed for this visit.  Subjective Assessment - 10/21/17 1302    Subjective  Patient was sore for 3-4 days after DN and then it improved. She reported some improvement with Rt leg but it was temporary. Patient thinks her pain is up today due to working in the garden and bending a lot. She continues to report mumbness in the RLE t knee.    Pertinent History  HTN, h/o CA, recent bone density (unsure of results)    Limitations  Standing;Sitting;Walking    How long can you sit comfortably?  1 hour    How long can you walk comfortably?  25-30 min    Diagnostic tests  NCV - pinched nerve    Patient Stated Goals  to decrease pain    Currently in Pain?  Yes    Pain Score  7     Pain Location  Buttocks    Pain Orientation  Right;Lower    Pain Descriptors / Indicators  Aching    Pain Type  Chronic pain  [place] Adult PT Treatment/Exercise - 10/21/17 0001      Exercises   Exercises  Knee/Hip      Knee/Hip Exercises: Stretches   Passive Hamstring Stretch  Right;60 seconds;2 reps    Piriformis Stretch  Right;2 reps;60 seconds    Piriformis Stretch Limitations  plus 1x60 sec knee across LLE    Other Knee/Hip Stretches  supine C stretch for Rt side      Knee/Hip Exercises: Aerobic   Nustep  L4x8 min      Knee/Hip Exercises: Machines for Strengthening   Cybex Knee Extension  5# 2 sets 12    Cybex Knee Flexion  15# 2 sets 12      Knee/Hip Exercises: Prone   Other Prone Exercises  POE and press ups 2x10 (overpressue painful)      Manual Therapy   Manual Therapy  Joint mobilization    Joint Mobilization  PA mobs central and unilateral gdII/II L3-L5/S1    Manual Traction  LLT to LLE               PT Short Term Goals - 09/09/17 1254      PT SHORT TERM GOAL #1   Status  Achieved        PT Long  Term Goals - 10/13/17 1314      PT LONG TERM GOAL #1   Title  Pt I with advanced HEP    Time  6    Period  Weeks    Status  Partially Met      PT LONG TERM GOAL #2   Title  Patient to report decreased frequency of RLE pain by 50% or more with ADLS.    Status  Partially Met      PT LONG TERM GOAL #3   Title  Patient to report decreased intensity of back and LE pain by 50% or more with ADLS.    Baseline  25% better; has stopped the "hot wire" feeling    Status  Partially Met      PT LONG TERM GOAL #4   Title  Patient to demo improved strength in RLE strength including R ankle eversion to 4+/5    Baseline  5/5 on 10/13/17    Status  Achieved            Plan - 10/21/17 1557    Clinical Impression Statement  Patient is making very slow progress with pain reduction. This may be in part to all of the gardening she is doing at home which requires stooping for long periods daily. She responded well to lumbar mobs and extension biased exercise. She has tight QL bil.    PT Treatment/Interventions  ADLs/Self Care Home Management;Cryotherapy;Electrical Stimulation;Moist Heat;Traction;Therapeutic exercise;Neuromuscular re-education;Patient/family education;Manual techniques;Dry needling;Taping    PT Next Visit Plan  continue with passive stretching, PA mobs to lumbar and extension biased TE.    PT Home Exercise Plan  supine "C" stretch for QL. seated piriformis and HS stretch    Consulted and Agree with Plan of Care  Patient       Patient will benefit from skilled therapeutic intervention in order to improve the following deficits and impairments:  Decreased activity tolerance, Decreased strength, Pain, Decreased range of motion, Impaired flexibility  Visit Diagnosis: Radiculopathy, lumbar region  Muscle weakness (generalized)     Problem List Patient Active Problem List   Diagnosis Date Noted  . S/P CABG (coronary artery bypass graft) 12/19/2016  . Abdominal pain, epigastric  04/11/2016  .  Nausea 04/11/2016  . 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  . Belching 01/03/2016  . Bloating 05/23/2015  . Diarrhea 05/23/2015  . Heartburn 05/23/2015  . Bilateral occipital neuralgia 11/23/2014  . Cervicogenic headache 11/23/2014  . Neck pain 11/23/2014  . OSA on CPAP 11/23/2014  . Generalized anxiety disorder 02/04/2014  . Labile hypertension   . Fibromyalgia   . Cervical spondylolysis 12/24/2013  . CAD (coronary artery disease) 12/24/2013  . Hypertensive emergency 12/23/2013  . Acute encephalopathy 12/23/2013  . Bronchitis, chronic obstructive w acute bronchitis ([place]) 12/21/2013  . Diverticulosis of colon without hemorrhage 11/15/2013  . Hot flashes related to aromatase inhibitor therapy 07/27/2013  . Osteopenia 07/27/2013  . DJD (degenerative joint disease) 09/29/2012  . Breast cancer, Left 12/20/2010  . Leg pain 11/28/2010  . FATTY LIVER DISEASE 01/09/2010  . GASTRITIS 12/16/2007  . Allergic rhinitis 02/18/2007  . DIZZINESS, CHRONIC 02/18/2007  . Headache in back of head 02/18/2007  . Mixed hyperlipidemia 12/23/2006  . GLAUCOMA 12/23/2006  . Coronary atherosclerosis 12/23/2006  . Asthma 12/23/2006  . Gastroesophageal reflux disease 12/23/2006  . IRRITABLE BOWEL SYNDROME 12/23/2006  . Myalgia and myositis 12/23/2006    Ethan Kasperski PT 10/21/2017, 4:07 PM  [address] [place], [place], [address] Phone: [phone]   Fax:  [phone]  Name: [name] MRN: [id number] Date of Birth: 06/16/1935

## 2017-10-22 MED FILL — AMLODIPINE BESYLATE 5 MG TA: 5 | 90 days supply | Qty: 90 | Fill #1

## 2017-10-22 MED FILL — ROSUVASTATIN CALCIUM 5 MG T: 5 | 70 days supply | Qty: 30 | Fill #1

## 2017-10-23 ENCOUNTER — Ambulatory Visit: Payer: Medicare Other | Admitting: Physical Therapy

## 2017-10-23 ENCOUNTER — Encounter: Payer: Self-pay | Admitting: Physical Therapy

## 2017-10-23 DIAGNOSIS — J301 Allergic rhinitis due to pollen: Secondary | ICD-10-CM | POA: Diagnosis not present

## 2017-10-23 DIAGNOSIS — M6281 Muscle weakness (generalized): Secondary | ICD-10-CM | POA: Diagnosis not present

## 2017-10-23 DIAGNOSIS — M5416 Radiculopathy, lumbar region: Secondary | ICD-10-CM

## 2017-10-23 DIAGNOSIS — J3089 Other allergic rhinitis: Secondary | ICD-10-CM | POA: Diagnosis not present

## 2017-10-23 NOTE — Therapy (Signed)
[address] [place], [place], [address] Phone: [phone]   Fax:  [phone]  Physical Therapy Treatment  Patient Details  Name: [name] MRN: [id number] Date of Birth: Jul 06, 1935 Referring Provider: Rod Can   Encounter Date: 10/23/2017  PT End of Session - 10/23/17 1041    Visit Number  12    Date for PT Re-Evaluation  11/07/17    PT Start Time  1012    PT Stop Time  1100    PT Time Calculation (min)  48 min       Past Medical History:  Diagnosis Date  . Asthma   . Atrophic vaginitis   . Breast cancer, Left 12/20/2010   NO BLOOD PRESSURE CHECKS OR STICKS IN LEFT ARM  . CAD (coronary artery disease)    a. 01/19/2010 s/p CABG x 3, lima->lad, vg->diag, vg->om1;  b. 06/2011 :Lexi MV: EF 84%, No ischemia. c. 01/06/14 s/p negative nuclear stress test with EF >70%  . Cervical arthritis   . Colonic polyp 04-27-2009   tubular adenoma  . Diverticulosis of colon (without mention of hemorrhage)   . Fibromyalgia   . Gallstones   . GERD (gastroesophageal reflux disease)   . Glaucoma   . Headache(784.0)    a. frequently assocaited with high BPs  . Hiatal hernia   . Hypercholesterolemia   . Irritable bowel syndrome   . Labile hypertension     Past Surgical History:  Procedure Laterality Date  . [place] STUDY N/A 01/08/2016   Procedure: [place] STUDY;  Surgeon: Ladene Artist, MD;  Location: [facility];  Service: Endoscopy;  Laterality: N/A;  . BREAST LUMPECTOMY Left 02/06/11  . CATARACT EXTRACTION, BILATERAL     bilateral caaract removal,  . CORONARY ANGIOPLASTY WITH STENT PLACEMENT     Stent 2007  . CORONARY ARTERY BYPASS GRAFT    . ESOPHAGEAL MANOMETRY N/A 01/08/2016   Procedure: ESOPHAGEAL MANOMETRY (EM);  Surgeon: Ladene Artist, MD;  Location: [facility];  Service: Endoscopy;  Laterality: N/A;  . open heart surgery     01/19/2010    There were no vitals filed for  this visit.  Subjective Assessment - 10/23/17 1037    Subjective  very sore and hurting in back and down RT leg.     Currently in Pain?  Yes    Pain Score  7     Pain Location  Buttocks                       [facility] Adult PT Treatment/Exercise - 10/23/17 0001      Moist Heat Therapy   Number Minutes Moist Heat  15 Minutes    Moist Heat Location  Lumbar Spine;Hip      Electrical Stimulation   Electrical Stimulation Location  buttock/LB    Electrical Stimulation Action  IFC    Electrical Stimulation Parameters  prone    Electrical Stimulation Goals  Pain      Manual Therapy   Manual Therapy  Other (comment)    Manual therapy comments  very tight BIL gluts,and painful RT SI    Soft tissue mobilization  BIL gluts    Myofascial Release  BIL SI    Passive ROM  LE and trunk    Manual Traction  sacral distraction    Other Manual Therapy  ext in prone 100% centralization  PT Education - 10/23/17 1040    Education Details  c stretch supine, piriformis stretch and explained need ot lye prone and ext in standing esp after flexion activities as it centralizes pain    Person(s) Educated  Patient    Methods  Explanation;Demonstration;Tactile cues    Comprehension  Verbalized understanding;Returned demonstration       PT Short Term Goals - 09/09/17 1254      PT SHORT TERM GOAL #1   Status  Achieved        PT Long Term Goals - 10/23/17 1041      PT LONG TERM GOAL #1   Title  Pt I with advanced HEP    Status  Partially Met      PT LONG TERM GOAL #2   Title  Patient to report decreased frequency of RLE pain by 50% or more with ADLS.    Status  Partially Met      PT LONG TERM GOAL #3   Title  Patient to report decreased intensity of back and LE pain by 50% or more with ADLS.    Status  Partially Met            Plan - 10/23/17 1042    Clinical Impression Statement  no goals met this week as pt is getting NO lasting relief with PT. pt is  responding to prone lieing and ext- educ on need to ext esp after flexion activities. pt very tight and pianful BIL gluts/SI.    PT Treatment/Interventions  ADLs/Self Care Home Management;Cryotherapy;Electrical Stimulation;Moist Heat;Traction;Therapeutic exercise;Neuromuscular re-education;Patient/family education;Manual techniques;Dry needling;Taping    PT Next Visit Plan  MD 9/3. Pt would possibly benefit from injection as she is gettin gno lasting relief with PT. Very tight and painful BIL SI RT> Left with pain.       Patient will benefit from skilled therapeutic intervention in order to improve the following deficits and impairments:  Decreased activity tolerance, Decreased strength, Pain, Decreased range of motion, Impaired flexibility  Visit Diagnosis: Radiculopathy, lumbar region     Problem List Patient Active Problem List   Diagnosis Date Noted  . S/P CABG (coronary artery bypass graft) 12/19/2016  . Abdominal pain, epigastric 04/11/2016  . Nausea 04/11/2016  . 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  . Belching 01/03/2016  . Bloating 05/23/2015  . Diarrhea 05/23/2015  . Heartburn 05/23/2015  . Bilateral occipital neuralgia 11/23/2014  . Cervicogenic headache 11/23/2014  . Neck pain 11/23/2014  . OSA on CPAP 11/23/2014  . Generalized anxiety disorder 02/04/2014  . Labile hypertension   . Fibromyalgia   . Cervical spondylolysis 12/24/2013  . CAD (coronary artery disease) 12/24/2013  . Hypertensive emergency 12/23/2013  . Acute encephalopathy 12/23/2013  . Bronchitis, chronic obstructive w acute bronchitis ([place]) 12/21/2013  . Diverticulosis of colon without hemorrhage 11/15/2013  . Hot flashes related to aromatase inhibitor therapy 07/27/2013  . Osteopenia 07/27/2013  . DJD (degenerative joint disease) 09/29/2012  . Breast cancer, Left 12/20/2010  . Leg pain 11/28/2010  . FATTY LIVER DISEASE 01/09/2010  . GASTRITIS 12/16/2007  . Allergic rhinitis 02/18/2007  .  DIZZINESS, CHRONIC 02/18/2007  . Headache in back of head 02/18/2007  . Mixed hyperlipidemia 12/23/2006  . GLAUCOMA 12/23/2006  . Coronary atherosclerosis 12/23/2006  . Asthma 12/23/2006  . Gastroesophageal reflux disease 12/23/2006  . IRRITABLE BOWEL SYNDROME 12/23/2006  . Myalgia and myositis 12/23/2006    PAYSEUR,ANGIE PTA 10/23/2017, 10:44 AM  [place]  [place], [place], [address] Phone: [phone]   Fax:  [phone]  Name: CAMRON ESSMAN MRN: [id number] Date of Birth: 1935-11-20

## 2017-10-29 ENCOUNTER — Ambulatory Visit: Payer: Medicare Other | Attending: Orthopedic Surgery | Admitting: Physical Therapy

## 2017-10-29 DIAGNOSIS — J3089 Other allergic rhinitis: Secondary | ICD-10-CM | POA: Diagnosis not present

## 2017-10-29 DIAGNOSIS — M5416 Radiculopathy, lumbar region: Secondary | ICD-10-CM | POA: Insufficient documentation

## 2017-10-29 DIAGNOSIS — J301 Allergic rhinitis due to pollen: Secondary | ICD-10-CM | POA: Diagnosis not present

## 2017-10-29 NOTE — Therapy (Addendum)
[address] [place], [place], [address] Phone: [phone]   Fax:  [phone]  Physical Therapy Treatment  Patient Details  Name: [name] MRN: [id number] Date of Birth: Oct 06, 1935 Referring Provider: Rod Can   Encounter Date: 10/29/2017  PT End of Session - 10/29/17 1258    Visit Number  13    Date for PT Re-Evaluation  11/07/17    Authorization Type  MCR; add KX    PT Start Time  1300    PT Stop Time  1400    PT Time Calculation (min)  60 min    Activity Tolerance  Patient tolerated treatment well    Behavior During Therapy  [facility] for tasks assessed/performed       Past Medical History:  Diagnosis Date  . Asthma   . Atrophic vaginitis   . Breast cancer, Left 12/20/2010   NO BLOOD PRESSURE CHECKS OR STICKS IN LEFT ARM  . CAD (coronary artery disease)    a. 01/19/2010 s/p CABG x 3, lima->lad, vg->diag, vg->om1;  b. 06/2011 :Lexi MV: EF 84%, No ischemia. c. 01/06/14 s/p negative nuclear stress test with EF >70%  . Cervical arthritis   . Colonic polyp 04-27-2009   tubular adenoma  . Diverticulosis of colon (without mention of hemorrhage)   . Fibromyalgia   . Gallstones   . GERD (gastroesophageal reflux disease)   . Glaucoma   . Headache(784.0)    a. frequently assocaited with high BPs  . Hiatal hernia   . Hypercholesterolemia   . Irritable bowel syndrome   . Labile hypertension     Past Surgical History:  Procedure Laterality Date  . [place] STUDY N/A 01/08/2016   Procedure: [place] STUDY;  Surgeon: Ladene Artist, MD;  Location: [facility];  Service: Endoscopy;  Laterality: N/A;  . BREAST LUMPECTOMY Left 02/06/11  . CATARACT EXTRACTION, BILATERAL     bilateral caaract removal,  . CORONARY ANGIOPLASTY WITH STENT PLACEMENT     Stent 2007  . CORONARY ARTERY BYPASS GRAFT    . ESOPHAGEAL MANOMETRY N/A 01/08/2016   Procedure: ESOPHAGEAL MANOMETRY (EM);  Surgeon:  Ladene Artist, MD;  Location: [facility];  Service: Endoscopy;  Laterality: N/A;  . open heart surgery     01/19/2010    There were no vitals filed for this visit.  Subjective Assessment - 10/29/17 1304    Subjective  Patient reports her MD appt was changed to 10/31/17. Right now patient feels just tightness in her R gluteals. she felt the "hot wire" in the post/med thigh to ankle yesterday. she had picked peas.     Pertinent History  HTN, h/o CA, recent bone density (unsure of results)    Limitations  Standing;Sitting;Walking    How long can you sit comfortably?  1 hour    How long can you walk comfortably?  25-30 min    Diagnostic tests  NCV - pinched nerve    Patient Stated Goals  to decrease pain    Currently in Pain?  Yes    Pain Score  4     Pain Location  Buttocks    Pain Orientation  Right;Lower    Pain Descriptors / Indicators  Aching    Pain Type  Chronic pain                       [facility] Adult PT Treatment/Exercise - 10/29/17 0001  Knee/Hip Exercises: Stretches   Piriformis Stretch  Both;2 reps;60 seconds    Other Knee/Hip Stretches  QL stretch in standing (not effective) and supine C stretch 2 x 60 sec; prone quad stretch 2x30 sec bil      Knee/Hip Exercises: Aerobic   Nustep  L4 x 6 min      Knee/Hip Exercises: Standing   Other Standing Knee Exercises  standing ext x 20   less pain with first set; 2nd set pt reports some numbness     Modalities   Modalities  Electrical Stimulation;Moist Heat      Moist Heat Therapy   Number Minutes Moist Heat  15 Minutes    Moist Heat Location  Lumbar Spine      Electrical Stimulation   Electrical Stimulation Location  bil lumbar/gluts    Electrical Stimulation Action  IFC    Electrical Stimulation Parameters  supine    Electrical Stimulation Goals  Pain      Manual Therapy   Manual Therapy  Joint mobilization;Soft tissue mobilization;Myofascial release    Joint Mobilization  PA mobs central and  unilateral to L3/4/5/S1 gd III    Soft tissue mobilization  to R lumbar     Myofascial Release  to R QL in [facility]               PT Short Term Goals - 09/09/17 1254      PT SHORT TERM GOAL #1   Status  Achieved        PT Long Term Goals - 10/23/17 1041      PT LONG TERM GOAL #1   Title  Pt I with advanced HEP    Status  Partially Met      PT LONG TERM GOAL #2   Title  Patient to report decreased frequency of RLE pain by 50% or more with ADLS.    Status  Partially Met      PT LONG TERM GOAL #3   Title  Patient to report decreased intensity of back and LE pain by 50% or more with ADLS.    Status  Partially Met            Plan - 10/29/17 1437    Clinical Impression Statement  Patient reports no change today other than having increased shooting pain down RLE after picking peas yesterday. She can still abolish sx with prone lying. She had incresed pain at left SIF after piriformis stretching today.No change in goal status since last visit.        Patient will benefit from skilled therapeutic intervention in order to improve the following deficits and impairments:     Visit Diagnosis: Radiculopathy, lumbar region     Problem List Patient Active Problem List   Diagnosis Date Noted  . S/P CABG (coronary artery bypass graft) 12/19/2016  . Abdominal pain, epigastric 04/11/2016  . Nausea 04/11/2016  . 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  . Belching 01/03/2016  . Bloating 05/23/2015  . Diarrhea 05/23/2015  . Heartburn 05/23/2015  . Bilateral occipital neuralgia 11/23/2014  . Cervicogenic headache 11/23/2014  . Neck pain 11/23/2014  . OSA on CPAP 11/23/2014  . Generalized anxiety disorder 02/04/2014  . Labile hypertension   . Fibromyalgia   . Cervical spondylolysis 12/24/2013  . CAD (coronary artery disease) 12/24/2013  . Hypertensive emergency 12/23/2013  . Acute encephalopathy 12/23/2013  . Bronchitis, chronic obstructive w acute bronchitis ([place])  12/21/2013  . Diverticulosis of colon without hemorrhage 11/15/2013  . Hot flashes  related to aromatase inhibitor therapy 07/27/2013  . Osteopenia 07/27/2013  . DJD (degenerative joint disease) 09/29/2012  . Breast cancer, Left 12/20/2010  . Leg pain 11/28/2010  . FATTY LIVER DISEASE 01/09/2010  . GASTRITIS 12/16/2007  . Allergic rhinitis 02/18/2007  . DIZZINESS, CHRONIC 02/18/2007  . Headache in back of head 02/18/2007  . Mixed hyperlipidemia 12/23/2006  . GLAUCOMA 12/23/2006  . Coronary atherosclerosis 12/23/2006  . Asthma 12/23/2006  . Gastroesophageal reflux disease 12/23/2006  . IRRITABLE BOWEL SYNDROME 12/23/2006  . Myalgia and myositis 12/23/2006    Kenrick Pore PT 10/29/2017, 3:53 PM  [address] [place], [place], [address] Phone: [phone]   Fax:  [phone]  Name: [name] MRN: [id number] Date of Birth: 1935/03/24   PHYSICAL THERAPY DISCHARGE SUMMARY  Visits from Start of Care: 13  Current functional level related to goals / functional outcomes: See above   Remaining deficits: See above   Education / Equipment: HEP Plan: Patient agrees to discharge.  Patient goals were partially met. Patient is being discharged due to not returning since the last visit.  ?????    Madelyn Flavors, PT 03/23/18 9:11 AM [address] [place], [place], [address] Phone: [phone]   Fax:  [phone]

## 2017-10-31 DIAGNOSIS — M545 Low back pain: Secondary | ICD-10-CM | POA: Diagnosis not present

## 2017-10-31 DIAGNOSIS — M1611 Unilateral primary osteoarthritis, right hip: Secondary | ICD-10-CM | POA: Diagnosis not present

## 2017-11-04 ENCOUNTER — Ambulatory Visit: Payer: Medicare Other | Admitting: Physical Therapy

## 2017-11-06 ENCOUNTER — Encounter: Payer: Medicare Other | Admitting: Physical Therapy

## 2017-11-06 DIAGNOSIS — J3089 Other allergic rhinitis: Secondary | ICD-10-CM | POA: Diagnosis not present

## 2017-11-06 DIAGNOSIS — J301 Allergic rhinitis due to pollen: Secondary | ICD-10-CM | POA: Diagnosis not present

## 2017-11-08 DIAGNOSIS — M545 Low back pain: Secondary | ICD-10-CM | POA: Diagnosis not present

## 2017-11-12 DIAGNOSIS — J3089 Other allergic rhinitis: Secondary | ICD-10-CM | POA: Diagnosis not present

## 2017-11-12 DIAGNOSIS — J301 Allergic rhinitis due to pollen: Secondary | ICD-10-CM | POA: Diagnosis not present

## 2017-11-12 NOTE — Progress Notes (Signed)
Patient ID: SHALECE STAFFA, female   DOB: 06-17-1935, 82 y.o.   MRN: [id number]   82 y.o. with HTN, HLD, Fibromyalgia, IBS and GERD. History of CAD and CABG 2011. 12/2013 nonischemic myovue and normal EF by echo. Tends to get hyponatremic with diuretics. Intolerant to statins and refuses to Try any more. Some issues with headaches and GERD. Seeing PT for RLE radiculopathy and leg pain Plain films From 09/18/17 reviewed and moderate osteoarthritic joint space disease    ROS: Denies fever, malais, weight loss, blurry vision, decreased visual acuity, cough, sputum, SOB, hemoptysis, pleuritic pain, palpitaitons, heartburn, abdominal pain, melena, lower extremity edema, claudication, or rash.  All other systems reviewed and negative  General: Vitals:   11/21/17 0827  BP: (!) 142/80  Pulse: 62  SpO2: 95%    Affect appropriate Obese white female  HEENT: normal Neck supple with no adenopathy JVP normal no bruits no thyromegaly Lungs clear with no wheezing and good diaphragmatic motion Heart:  S1/S2 no murmur, no rub, gallop or click PMI normal Abdomen: benighn, BS positve, no tenderness, no AAA no bruit.  No HSM or HJR Distal pulses intact with no bruits No edema Neuro non-focal Skin warm and dry No muscular weakness     Current Outpatient Medications  Medication Sig Dispense Refill  . Acetaminophen (TYLENOL 8 HOUR PO) Take by mouth as directed.     [name] albuterol (VENTOLIN HFA) 108 (90 Base) MCG/ACT inhaler Inhale 2 puffs into the lungs every 6 (six) hours as needed for wheezing or shortness of breath.    [name] amLODipine (NORVASC) 5 MG tablet TAKE 1 TABLET BY MOUTH ONCE DAILY 90 tablet 1  . aspirin EC 81 MG tablet Take 81 mg by mouth daily.    . Calcium & Magnesium Carbonates (MYLANTA PO) Take by mouth. 1-2 teaspoons as needed    . CARAFATE 1 GM/10ML suspension take 10 milliliters by mouth four times a day with meals and at bedtime 420 mL 2  . Elastic Bandages & Supports (CARPAL  TUNNEL WRIST STABILIZER) MISC Use on right wrist daily 1 each 0  . fexofenadine (ALLEGRA) 180 MG tablet Take 180 mg by mouth daily.      . fluocinonide (LIDEX) 0.05 % external solution Apply 1 application topically daily as needed (scalp itching).     . Glucosamine-Chondroit-Vit C-Mn (GLUCOSAMINE CHONDR 1500 COMPLX PO) Take 1 tablet by mouth 3 (three) times daily.      . hydrALAZINE (APRESOLINE) 50 MG tablet TAKE 1/2 TABLET BY MOUTH THREE TIMES DAILY 45 tablet 1  . ketoconazole (NIZORAL) 2 % shampoo Apply 1 application topically 2 (two) times a week. As needed for itching a couple of times week    . latanoprost (XALATAN) 0.005 % ophthalmic solution Place 1 drop into both eyes at bedtime.     [name] losartan (COZAAR) 100 MG tablet take 1/2 tablet by mouth three times a day 45 tablet 5  . magnesium oxide (MAG-OX) 400 MG tablet Take 400 mg by mouth daily. Reported on 02/14/2015    . metoprolol tartrate (LOPRESSOR) 50 MG tablet take 1 tablet by mouth three times a day 270 tablet 3  . metoprolol tartrate (LOPRESSOR) 50 MG tablet Take 50 mg by mouth 3 (three) times daily. Patient takes it as she needs to, if bp is too low she does not take as many as 3 a day.    . mometasone (NASONEX) 50 MCG/ACT nasal spray Place 2 sprays into the nose daily as needed (congestion).     [name]  nitroGLYCERIN (NITROSTAT) 0.4 MG SL tablet Place 1 tablet (0.4 mg total) under the tongue every 5 (five) minutes as needed for chest pain. 25 tablet 2  . Omega-3 Fatty Acids (FISH OIL) 1200 MG CAPS Take 1,200 mg by mouth daily. Reported on 02/14/2015    . pantoprazole (PROTONIX) 40 MG tablet Take 1 tablet (40 mg total) by mouth daily. 30 tablet 0  . Psyllium (METAMUCIL FIBER PO) Take 1 tablet by mouth daily.     . ranitidine (ZANTAC) 300 MG capsule Take 1 capsule (300 mg total) by mouth every evening. 30 capsule 3  . rosuvastatin (CRESTOR) 5 MG tablet TAKE 1 TABLET BY MOUTH 3 TIMES A WEEK. (TAKE ON MONDAY, WEDNESDAY, AND FRIDAY) 30 tablet 5   . traMADol (ULTRAM) 50 MG tablet Take 1/2 to 1 tablet by mouth three times daily as needed 90 tablet 5  . vitamin B-12 (CYANOCOBALAMIN) 1000 MCG tablet Take 1,000 mcg by mouth daily.       No current facility-administered medications for this visit.     Allergies  Levaquin [levofloxacin in d5w]; Choline fenofibrate; Hctz [hydrochlorothiazide]; Simvastatin; Adhesive [tape]; Bentyl [dicyclomine hcl]; Ceclor [cefaclor]; Clarithromycin; Codeine; Doxycycline; Hydrocodone; Lisinopril; Penicillins; and Tobramycin-dexamethasone  Electrocardiogram:   11/21/17 SR rate 57 nonspecific ST changes   Assessment and Plan  CAD/CABG: 2011  non ishcemic myovue 2015  She will call if has to take nitro continue ASA/Beta blocker Some esophageal dysmotility causes dysphagia and ? Chest pain that confused picture  HTN:  Improved on amlodipine   Breast Cancer   F/U Magrinat No BP's on left arm  mamogram last year ok  Chol:   Lab Results  Component Value Date   LDLCALC 100 (H) 06/19/2017   Labs with Dr Ree Kida to statin    Headaches:  Seen by neurology cervicogenic ? Nerve block injection x1 continue PT gabapentin has been d/c   GI:  Carafate , protonix, ranitidine help GERD HIDA scan 04/19/16 normal IBgard and metamucil for IBS Will f/u with Dr Fuller Plan  F/U me in a year   Jenkins Rouge

## 2017-11-14 DIAGNOSIS — M48061 Spinal stenosis, lumbar region without neurogenic claudication: Secondary | ICD-10-CM | POA: Diagnosis not present

## 2017-11-14 DIAGNOSIS — M4316 Spondylolisthesis, lumbar region: Secondary | ICD-10-CM | POA: Diagnosis not present

## 2017-11-14 DIAGNOSIS — M545 Low back pain: Secondary | ICD-10-CM | POA: Diagnosis not present

## 2017-11-21 ENCOUNTER — Ambulatory Visit (INDEPENDENT_AMBULATORY_CARE_PROVIDER_SITE_OTHER): Payer: Medicare Other | Admitting: Cardiovascular Disease

## 2017-11-21 ENCOUNTER — Encounter: Payer: Self-pay | Admitting: Cardiovascular Disease

## 2017-11-21 VITALS — BP 142/80 | HR 62 | Ht <= 58 in | Wt 137.5 lb

## 2017-11-21 DIAGNOSIS — I2510 Atherosclerotic heart disease of native coronary artery without angina pectoris: Secondary | ICD-10-CM

## 2017-11-21 DIAGNOSIS — I2583 Coronary atherosclerosis due to lipid rich plaque: Secondary | ICD-10-CM | POA: Diagnosis not present

## 2017-11-21 NOTE — Patient Instructions (Addendum)

## 2017-11-26 DIAGNOSIS — J3089 Other allergic rhinitis: Secondary | ICD-10-CM | POA: Diagnosis not present

## 2017-11-26 DIAGNOSIS — J301 Allergic rhinitis due to pollen: Secondary | ICD-10-CM | POA: Diagnosis not present

## 2017-12-09 DIAGNOSIS — J3089 Other allergic rhinitis: Secondary | ICD-10-CM | POA: Diagnosis not present

## 2017-12-09 DIAGNOSIS — J301 Allergic rhinitis due to pollen: Secondary | ICD-10-CM | POA: Diagnosis not present

## 2017-12-15 ENCOUNTER — Other Ambulatory Visit: Payer: Self-pay | Admitting: Pulmonary Disease

## 2017-12-16 ENCOUNTER — Encounter: Payer: Self-pay | Admitting: Gastroenterology

## 2017-12-16 ENCOUNTER — Ambulatory Visit (INDEPENDENT_AMBULATORY_CARE_PROVIDER_SITE_OTHER): Payer: Medicare Other | Admitting: Gastroenterology

## 2017-12-16 VITALS — BP 120/56 | HR 60 | Ht <= 58 in | Wt 137.1 lb

## 2017-12-16 DIAGNOSIS — I2583 Coronary atherosclerosis due to lipid rich plaque: Secondary | ICD-10-CM

## 2017-12-16 DIAGNOSIS — J301 Allergic rhinitis due to pollen: Secondary | ICD-10-CM | POA: Diagnosis not present

## 2017-12-16 DIAGNOSIS — I2510 Atherosclerotic heart disease of native coronary artery without angina pectoris: Secondary | ICD-10-CM

## 2017-12-16 DIAGNOSIS — J3089 Other allergic rhinitis: Secondary | ICD-10-CM | POA: Diagnosis not present

## 2017-12-16 DIAGNOSIS — M4316 Spondylolisthesis, lumbar region: Secondary | ICD-10-CM | POA: Diagnosis not present

## 2017-12-16 DIAGNOSIS — M48061 Spinal stenosis, lumbar region without neurogenic claudication: Secondary | ICD-10-CM | POA: Diagnosis not present

## 2017-12-16 DIAGNOSIS — R1032 Left lower quadrant pain: Secondary | ICD-10-CM

## 2017-12-16 DIAGNOSIS — M5136 Other intervertebral disc degeneration, lumbar region: Secondary | ICD-10-CM | POA: Insufficient documentation

## 2017-12-16 MED ORDER — METRONIDAZOLE 250 MG PO TABS: 250.0000 mg | ORAL_TABLET | Freq: Three times a day (TID) | ORAL | 0 refills | Status: AC

## 2017-12-16 MED ORDER — CIPROFLOXACIN HCL 250 MG PO TABS: 250.0000 mg | ORAL_TABLET | Freq: Two times a day (BID) | ORAL | 0 refills | Status: AC

## 2017-12-16 NOTE — Progress Notes (Signed)
12/16/2017 [name] [id number] [birth year]   HISTORY OF PRESENT ILLNESS: This is an 82 year old female who is a patient of Dr. Silvio Pate.  She has history of IBS and GERD.  She actually has not been here since spring 2018.  She presents here today with complaints of left lower quadrant abdominal pain that just began this past weekend.  She says that she has her chronic complaints of bloating for which she has been using FD guard and IBgard to manage those, but over the weekend she developed this sudden onset abdominal pain.  She says that on Sunday it got really severe.  Had some nausea yesterday, but no vomiting.  Says it felt like she had to have a bowel movement and then yesterday when she finally went the pain eased slightly, but is still present.  She denies any fevers or chills.  She says that now her whole lower abdomen just feels sore.    Past Medical History:  Diagnosis Date  . Asthma   . Atrophic vaginitis   . Breast cancer, Left 12/20/2010   NO BLOOD PRESSURE CHECKS OR STICKS IN LEFT ARM  . CAD (coronary artery disease)    a. 01/19/2010 s/p CABG x 3, [place]a->lad, vg->diag, vg->om1;  b. 06/2011 :Lexi MV: EF 84%, No ischemia. c. 01/06/14 s/p negative nuclear stress test with EF >70%  . Cervical arthritis   . Colonic polyp 04-27-2009   tubular adenoma  . Diverticulosis of colon (without mention of hemorrhage)   . Fibromyalgia   . Gallstones   . GERD (gastroesophageal reflux disease)   . Glaucoma   . Headache(784.0)    a. frequently assocaited with high BPs  . Hiatal hernia   . Hypercholesterolemia   . Irritable bowel syndrome   . Labile hypertension   . Lymphedema    left arm  . Pinched vertebral nerve    Past Surgical History:  Procedure Laterality Date  . 24 HOUR PH STUDY N/A 01/08/2016   Procedure: 24 HOUR PH STUDY;  Surgeon: Malcolm T Stark, MD;  Location: [facility];  Service: Endoscopy;  Laterality: N/A;  . BREAST LUMPECTOMY Left 02/06/11  . CATARACT  EXTRACTION, BILATERAL     bilateral caaract removal,  . CORONARY ANGIOPLASTY WITH STENT PLACEMENT     Stent 2007  . CORONARY ARTERY BYPASS GRAFT    . ESOPHAGEAL MANOMETRY N/A 01/08/2016   Procedure: ESOPHAGEAL MANOMETRY (EM);  Surgeon: Malcolm T Stark, MD;  Location: [facility];  Service: Endoscopy;  Laterality: N/A;  . open heart surgery     11 /25/2011    reports that she has never smoked. She has never used smokeless tobacco. She reports that she does not drink alcohol or use drugs. family history includes Breast cancer in her sister; Cancer in her brother; Diabetes in her sister; Heart disease in her brother; Prostate cancer in her father; Stroke in her father and mother. Allergies  Allergen Reactions  . Levaquin [Levofloxacin In D5w]     Elevated BP  . Choline Fenofibrate Other (See Comments)     pt states INTOL to Trilipix w/ "thigh burning"  . Hctz [Hydrochlorothiazide]     Causes hyponatremia  . Simvastatin Other (See Comments)     pt states INTOL to STATINS \\T \ refuses to restart  . Adhesive [Tape] Rash  . Bentyl [Dicyclomine Hcl]     Made me feel weird and drained me  . Ceclor [Cefaclor] Rash  . Clarithromycin Rash  . Codeine Nausea Only  .  Doxycycline Rash  . Hydrocodone     Nightmare after taking cough syrup w/hydrocodone  . Lisinopril Cough    Developed ACE cough...  . Penicillins Itching and Rash    At injection site  . Tobramycin-Dexamethasone Rash      Outpatient Encounter Medications as of 12/16/2017  Medication Sig  . Acetaminophen (TYLENOL 8 HOUR PO) Take by mouth as directed.   [name] albuterol (VENTOLIN HFA) 108 (90 Base) MCG/ACT inhaler Inhale 2 puffs into the lungs every 6 (six) hours as needed for wheezing or shortness of breath.  . AMBULATORY NON FORMULARY MEDICATION as needed. FDGARD  . amLODipine (NORVASC) 5 MG tablet TAKE 1 TABLET BY MOUTH ONCE DAILY  . aspirin EC 81 MG tablet Take 81 mg by mouth daily.  . Calcium & Magnesium Carbonates (MYLANTA  PO) Take by mouth. 1-2 teaspoons as needed  . CARAFATE 1 GM/10ML suspension take 10 milliliters by mouth four times a day with meals and at bedtime  . Elastic Bandages & Supports (CARPAL TUNNEL WRIST STABILIZER) MISC Use on right wrist daily  . fexofenadine (ALLEGRA) 180 MG tablet Take 180 mg by mouth daily.    . fluocinonide (LIDEX) 0.05 % external solution Apply 1 application topically daily as needed (scalp itching).   . Glucosamine-Chondroit-Vit C-Mn (GLUCOSAMINE CHONDR 1500 COMPLX PO) Take 1 tablet by mouth 3 (three) times daily.    . hydrALAZINE (APRESOLINE) 50 MG tablet TAKE 1/2 TABLET BY MOUTH THREE TIMES DAILY  . ketoconazole (NIZORAL) 2 % shampoo Apply 1 application topically 2 (two) times a week. As needed for itching a couple of times week  . latanoprost (XALATAN) 0.005 % ophthalmic solution Place 1 drop into both eyes at bedtime.   [name] losartan (COZAAR) 100 MG tablet TAKE 1/2 TABLET BY MOUTH THREE TIMES A DAY  . magnesium oxide (MAG-OX) 400 MG tablet Take 400 mg by mouth daily. Reported on 02/14/2015  . metoprolol tartrate (LOPRESSOR) 50 MG tablet Take 50 mg by mouth 3 (three) times daily. Patient takes it as she needs to, if bp is too low she does not take as many as 3 a day.  . mometasone (NASONEX) 50 MCG/ACT nasal spray Place 2 sprays into the nose daily as needed (congestion).   . nitroGLYCERIN (NITROSTAT) 0.4 MG SL tablet Place 1 tablet (0.4 mg total) under the tongue every 5 (five) minutes as needed for chest pain.  . Omega-3 Fatty Acids (FISH OIL) 1200 MG CAPS Take 1,200 mg by mouth daily. Reported on 02/14/2015  . omeprazole (PRILOSEC OTC) 20 MG tablet Take 20 mg by mouth daily.  [name] Peppermint Oil (IBGARD) 90 MG CPCR Take by mouth as needed.  . Psyllium (METAMUCIL FIBER PO) Take 1 tablet by mouth daily.   . rosuvastatin (CRESTOR) 5 MG tablet TAKE 1 TABLET BY MOUTH 3 TIMES A WEEK. (TAKE ON MONDAY, [place], AND FRIDAY)  . traMADol (ULTRAM) 50 MG tablet Take 1/2 to 1 tablet by  mouth three times daily as needed  . vitamin B-12 (CYANOCOBALAMIN) 1000 MCG tablet Take 1,000 mcg by mouth daily.    . [DISCONTINUED] metoprolol tartrate (LOPRESSOR) 50 MG tablet take 1 tablet by mouth three times a day  . [DISCONTINUED] pantoprazole (PROTONIX) 40 MG tablet Take 1 tablet (40 mg total) by mouth daily.  . [DISCONTINUED] ranitidine (ZANTAC) 300 MG capsule Take 1 capsule (300 mg total) by mouth every evening.   No facility-administered encounter medications on file as of 12/16/2017.      REVIEW OF SYSTEMS  :  All other systems reviewed and negative except where noted in the History of Present Illness.   PHYSICAL EXAM: BP (!) 120/56 (BP Location: Right Arm, Patient Position: Sitting, Cuff Size: Normal)   Pulse 60   Ht 4' 9.5" (1.461 m)   Wt 137 lb 2 oz (62.2 kg)   LMP 11/26/1990   BMI 29.16 kg/m  General: Well developed white female in no acute distress Head: Normocephalic and atraumatic Eyes:  Sclerae anicteric, conjunctiva pink. Ears: Normal auditory acuity Lungs: Clear throughout to auscultation; no increased WOB. Heart: Regular rate and rhythm; no M/R/G. Abdomen: Soft, non-distended.  BS present.  Mild to moderate LLQ TTP. Musculoskeletal: Symmetrical with no gross deformities  Skin: No lesions on visible extremities Extremities: No edema  Neurological: Alert oriented x 4, grossly non-focal Psychological:  Alert and cooperative. Normal mood and affect  ASSESSMENT AND PLAN: *82 year old female with history of IBS and diverticulosis.  Presents with LLQ abdominal pain that began this past weekend.  She is tender on exam.  Will treat empirically with cipro and flagyl.  Says that she needs "low dose" antibiotics.  Will treat with cipro 250 mg BID and flagyl 250 mg TID for 10 days as it appears that was used several years ago.  She will call back after her course of treatment to give [place] an update on her symptoms.  Discussed drinking plenty of fluids and eating a bland/low  fiber diet for the next several days.   CC:  Noralee Space, MD

## 2017-12-16 NOTE — Progress Notes (Signed)
Reviewed and agree with initial management plan.  Stashia Sia T. Benjerman Molinelli, MD FACG 

## 2017-12-16 NOTE — Patient Instructions (Signed)
  We have sent the following medications to your pharmacy for you to pick up at your convenience: Cipro, flagyl   Call us back after finishing your medicine and ask for Patty and give her an update.    I appreciate the opportunity to care for you. Silvano Rusk , MD, [facility]

## 2017-12-18 DIAGNOSIS — Z23 Encounter for immunization: Secondary | ICD-10-CM | POA: Diagnosis not present

## 2017-12-19 ENCOUNTER — Other Ambulatory Visit: Payer: Self-pay | Admitting: Cardiovascular Disease

## 2017-12-23 DIAGNOSIS — J3089 Other allergic rhinitis: Secondary | ICD-10-CM | POA: Diagnosis not present

## 2017-12-23 DIAGNOSIS — J301 Allergic rhinitis due to pollen: Secondary | ICD-10-CM | POA: Diagnosis not present

## 2017-12-26 DIAGNOSIS — J3089 Other allergic rhinitis: Secondary | ICD-10-CM | POA: Diagnosis not present

## 2017-12-26 DIAGNOSIS — J301 Allergic rhinitis due to pollen: Secondary | ICD-10-CM | POA: Diagnosis not present

## 2017-12-29 ENCOUNTER — Telehealth: Payer: Self-pay | Admitting: Gastroenterology

## 2017-12-29 NOTE — Telephone Encounter (Signed)
The pt states she is doing well, will call back if symptoms return.  [name]

## 2018-01-01 DIAGNOSIS — J301 Allergic rhinitis due to pollen: Secondary | ICD-10-CM | POA: Diagnosis not present

## 2018-01-01 DIAGNOSIS — J3089 Other allergic rhinitis: Secondary | ICD-10-CM | POA: Diagnosis not present

## 2018-01-13 DIAGNOSIS — J3089 Other allergic rhinitis: Secondary | ICD-10-CM | POA: Diagnosis not present

## 2018-01-13 DIAGNOSIS — J301 Allergic rhinitis due to pollen: Secondary | ICD-10-CM | POA: Diagnosis not present

## 2018-01-15 ENCOUNTER — Encounter: Payer: Self-pay | Admitting: Certified Nurse Midwife

## 2018-01-15 ENCOUNTER — Other Ambulatory Visit: Payer: Self-pay

## 2018-01-15 ENCOUNTER — Ambulatory Visit (INDEPENDENT_AMBULATORY_CARE_PROVIDER_SITE_OTHER): Payer: Medicare Other | Admitting: Certified Nurse Midwife

## 2018-01-15 VITALS — BP 118/80 | HR 70 | Resp 16 | Ht <= 58 in | Wt 136.0 lb

## 2018-01-15 DIAGNOSIS — J301 Allergic rhinitis due to pollen: Secondary | ICD-10-CM | POA: Diagnosis not present

## 2018-01-15 DIAGNOSIS — J3089 Other allergic rhinitis: Secondary | ICD-10-CM | POA: Diagnosis not present

## 2018-01-15 DIAGNOSIS — Z01419 Encounter for gynecological examination (general) (routine) without abnormal findings: Secondary | ICD-10-CM | POA: Diagnosis not present

## 2018-01-15 DIAGNOSIS — N952 Postmenopausal atrophic vaginitis: Secondary | ICD-10-CM | POA: Diagnosis not present

## 2018-01-15 NOTE — Patient Instructions (Signed)

## 2018-01-15 NOTE — Progress Notes (Signed)
82 y.o. [id number] Married  Caucasian Fe here for annual exam. Post menopausal no vaginal bleeding. Has used Monistat derm as needed for vaginal itching. No concerns with itching today. Using coconut oil for vaginal dryness,  as needed. Occasional night urination but no incontinence. Sees Teressa Lower PCP for aex, labs, Hypertension, cholesterol management. No other health issues today.  Patient's last menstrual period was 11/26/1990.          Sexually active: No.  The current method of family planning is post menopausal status.    Exercising: Yes.    walking occ Smoker:  no  Review of Systems  Constitutional: Negative.   HENT: Negative.   Eyes: Negative.   Respiratory: Negative.   Cardiovascular: Negative.   Gastrointestinal:       Bloating  Genitourinary: Negative.   Musculoskeletal: Negative.   Skin: Negative.   Neurological: Negative.   Endo/Heme/Allergies: Negative.   Psychiatric/Behavioral: Negative.     Health Maintenance: Pap:  09-06-14 neg, 01-14-17 presence or absence of endocervical/transformation zone cant be determined due to atrophy-neg pap History of Abnormal Pap: no MMG:  07-10-17 category b density birads 2:neg Self Breast exams: no Colonoscopy:  2016 tubular adenoma, hyperplastic polyp, no repeat due to age BMD:   2019 TDaP:  2011 Shingles: 2015 Pneumonia: 2016 Hep C and HIV: not done Labs: if needed   reports that she has never smoked. She has never used smokeless tobacco. She reports that she does not drink alcohol or use drugs.  Past Medical History:  Diagnosis Date  . Asthma   . Atrophic vaginitis   . Breast cancer, Left 12/20/2010   NO BLOOD PRESSURE CHECKS OR STICKS IN LEFT ARM  . CAD (coronary artery disease)    a. 01/19/2010 s/p CABG x 3, lima->lad, vg->diag, vg->om1;  b. 06/2011 :Lexi MV: EF 84%, No ischemia. c. 01/06/14 s/p negative nuclear stress test with EF >70%  . Cervical arthritis   . Colonic polyp 04-27-2009   tubular adenoma  .  Diverticulosis of colon (without mention of hemorrhage)   . Fibromyalgia   . Gallstones   . GERD (gastroesophageal reflux disease)   . Glaucoma   . Headache(784.0)    a. frequently assocaited with high BPs  . Hiatal hernia   . Hypercholesterolemia   . Irritable bowel syndrome   . Labile hypertension   . Lymphedema    left arm  . Pinched vertebral nerve     Past Surgical History:  Procedure Laterality Date  . [place] STUDY N/A 01/08/2016   Procedure: [place] STUDY;  Surgeon: Ladene Artist, MD;  Location: [facility];  Service: Endoscopy;  Laterality: N/A;  . BREAST LUMPECTOMY Left 02/06/11  . CATARACT EXTRACTION, BILATERAL     bilateral caaract removal,  . CORONARY ANGIOPLASTY WITH STENT PLACEMENT     Stent 2007  . CORONARY ARTERY BYPASS GRAFT    . ESOPHAGEAL MANOMETRY N/A 01/08/2016   Procedure: ESOPHAGEAL MANOMETRY (EM);  Surgeon: Ladene Artist, MD;  Location: [facility];  Service: Endoscopy;  Laterality: N/A;  . open heart surgery     01/19/2010    Current Outpatient Medications  Medication Sig Dispense Refill  . Acetaminophen (TYLENOL 8 HOUR PO) Take by mouth as directed.     . AMBULATORY NON FORMULARY MEDICATION as needed. FDGARD    . amLODipine (NORVASC) 5 MG tablet TAKE 1 TABLET BY MOUTH ONCE DAILY 90 tablet 1  . aspirin EC 81 MG tablet Take 81 mg by  mouth daily.    [name] CALCIUM PO Take by mouth.    [name] CARAFATE 1 GM/10ML suspension take 10 milliliters by mouth four times a day with meals and at bedtime 420 mL 2  . fexofenadine (ALLEGRA) 180 MG tablet Take 180 mg by mouth daily.      . Glucosamine-Chondroit-Vit C-Mn (GLUCOSAMINE CHONDR 1500 COMPLX PO) Take 1 tablet by mouth 3 (three) times daily.      . hydrALAZINE (APRESOLINE) 50 MG tablet TAKE 1/2 TABLET BY MOUTH THREE TIMES DAILY 45 tablet 1  . latanoprost (XALATAN) 0.005 % ophthalmic solution Place 1 drop into both eyes at bedtime.     [name] losartan (COZAAR) 100 MG tablet TAKE 1/2 TABLET BY MOUTH THREE TIMES  A DAY 45 tablet 0  . magnesium oxide (MAG-OX) 400 MG tablet Take 400 mg by mouth daily. Reported on 02/14/2015    . metoprolol tartrate (LOPRESSOR) 50 MG tablet TAKE 1 TABLET BY MOUTH THREE TIMES A DAY 270 tablet 3  . mometasone (NASONEX) 50 MCG/ACT nasal spray Place 2 sprays into the nose daily as needed (congestion).     . Omega-3 Fatty Acids (FISH OIL) 1200 MG CAPS Take 1,200 mg by mouth daily. Reported on 02/14/2015    . Peppermint Oil (IBGARD) 90 MG CPCR Take by mouth as needed.    . Psyllium (METAMUCIL FIBER PO) Take 1 tablet by mouth daily.     . rosuvastatin (CRESTOR) 5 MG tablet TAKE 1 TABLET BY MOUTH 3 TIMES A WEEK. (TAKE ON MONDAY, WEDNESDAY, AND FRIDAY) 30 tablet 5  . traMADol (ULTRAM) 50 MG tablet Take 1/2 to 1 tablet by mouth three times daily as needed 90 tablet 5  . vitamin B-12 (CYANOCOBALAMIN) 1000 MCG tablet Take 1,000 mcg by mouth daily.      [name] VITAMIN D PO Take by mouth.    [name] albuterol (VENTOLIN HFA) 108 (90 Base) MCG/ACT inhaler Inhale 2 puffs into the lungs every 6 (six) hours as needed for wheezing or shortness of breath.    . nitroGLYCERIN (NITROSTAT) 0.4 MG SL tablet Place 1 tablet (0.4 mg total) under the tongue every 5 (five) minutes as needed for chest pain. (Patient not taking: Reported on 01/15/2018) 25 tablet 2   No current facility-administered medications for this visit.     Family History  Problem Relation Age of Onset  . Stroke Mother   . Stroke Father   . Prostate cancer Father   . Breast cancer Sister   . Diabetes Sister   . Cancer Brother        gland cancer  . Heart disease Brother   . Colon cancer Neg Hx   . Stomach cancer Neg Hx   . Pancreatic cancer Neg Hx   . Kidney disease Neg Hx   . Liver disease Neg Hx     ROS:  Pertinent items are noted in HPI.  Otherwise, a comprehensive ROS was negative.  Exam:   BP 118/80   Pulse 70   Resp 16   Ht 4' 9.5" (1.461 m)   Wt 136 lb (61.7 kg)   LMP 11/26/1990   BMI 28.92 kg/m  Height: 4' 9.5"  (146.1 cm) Ht Readings from Last 3 Encounters:  01/15/18 4' 9.5" (1.461 m)  12/16/17 4' 9.5" (1.461 m)  11/21/17 4\' 10"  (1.473 m)    General appearance: alert, cooperative and appears stated age Head: Normocephalic, without obvious abnormality, atraumatic Neck: no adenopathy, supple, symmetrical, trachea midline and thyroid normal to inspection  and palpation Lungs: clear to auscultation bilaterally Breasts: normal appearance, no masses or tenderness, No nipple retraction or dimpling, No nipple discharge or bleeding, No axillary or supraclavicular adenopathy Heart: regular rate and rhythm Abdomen: soft, non-tender; no masses,  no organomegaly Extremities: extremities normal, atraumatic, no cyanosis or edema Skin: Skin color, texture, turgor normal. No rashes or lesions Lymph nodes: Cervical, supraclavicular, and axillary nodes normal. No abnormal inguinal nodes palpated Neurologic: Grossly normal   Pelvic: External genitalia:  no lesions              Urethra:  normal appearing urethra with no masses, tenderness or lesions              Bartholin's and Skene's: normal                 Vagina: normal appearing vagina with normal color and discharge, no lesions              Cervix: no cervical motion tenderness, no lesions and normal appearance              Pap taken: No. Bimanual Exam:  Uterus:  normal size, contour, position, consistency, mobility, non-tender              Adnexa: normal adnexa and no mass, fullness, tenderness               Rectovaginal: Confirms               Anus:  normal sphincter tone, no lesions  Chaperone present: yes  A:  Well Woman with normal exam  Post menopausal, no HRT  Cholesterol, hypertension management with PCP.  P:   Reviewed health and wellness pertinent to exam  Aware to advise if vaginal bleeding.  Pap smear: no  counseled on breast self exam, mammography screening, STD prevention  return annually or prn  An After Visit Summary was printed  and given to the patient.

## 2018-01-27 DIAGNOSIS — J3089 Other allergic rhinitis: Secondary | ICD-10-CM | POA: Diagnosis not present

## 2018-01-27 DIAGNOSIS — J301 Allergic rhinitis due to pollen: Secondary | ICD-10-CM | POA: Diagnosis not present

## 2018-01-27 DIAGNOSIS — H40013 Open angle with borderline findings, low risk, bilateral: Secondary | ICD-10-CM | POA: Diagnosis not present

## 2018-01-29 ENCOUNTER — Other Ambulatory Visit: Payer: Self-pay | Admitting: Family Medicine

## 2018-01-29 ENCOUNTER — Other Ambulatory Visit: Payer: Self-pay | Admitting: Pulmonary Disease

## 2018-01-29 MED FILL — ROSUVASTATIN CALCIUM 5 MG T: 5 | 70 days supply | Qty: 30 | Fill #2

## 2018-01-29 NOTE — Telephone Encounter (Signed)
Copied from [place] [id number]. Topic: Quick Communication - Rx Refill/Question >> Jan 29, 2018  9:50 AM Alanda Slim E wrote: Medication: losartan (COZAAR) 100 MG tablet ( Pt has a New Patient appt with Dr. Deborra Medina scheduled for 02.17.2020) but is in need of meds (previous provider was Dr. Teressa Lower)  Has the patient contacted their pharmacy? No. (No refills on the bottle and Dr. Etheleen Mayhew required)    Preferred Pharmacy (with phone number or street name): [id number] [address], [place] [phone] (Phone) [phone] (Fax)

## 2018-01-29 NOTE — Telephone Encounter (Signed)
Pt called back and was advised of message from NT, Malinda Jobe.  Pt states clear understanding.

## 2018-02-02 ENCOUNTER — Other Ambulatory Visit: Payer: Self-pay | Admitting: Cardiovascular Disease

## 2018-02-02 MED FILL — AMLODIPINE BESYLATE 5 MG TA: 5 | 90 days supply | Qty: 90 | Fill #0

## 2018-02-11 DIAGNOSIS — J301 Allergic rhinitis due to pollen: Secondary | ICD-10-CM | POA: Diagnosis not present

## 2018-02-11 DIAGNOSIS — J3089 Other allergic rhinitis: Secondary | ICD-10-CM | POA: Diagnosis not present

## 2018-03-04 DIAGNOSIS — J301 Allergic rhinitis due to pollen: Secondary | ICD-10-CM | POA: Diagnosis not present

## 2018-03-04 DIAGNOSIS — J3089 Other allergic rhinitis: Secondary | ICD-10-CM | POA: Diagnosis not present

## 2018-03-17 DIAGNOSIS — J301 Allergic rhinitis due to pollen: Secondary | ICD-10-CM | POA: Diagnosis not present

## 2018-03-17 DIAGNOSIS — J3089 Other allergic rhinitis: Secondary | ICD-10-CM | POA: Diagnosis not present

## 2018-03-31 DIAGNOSIS — J301 Allergic rhinitis due to pollen: Secondary | ICD-10-CM | POA: Diagnosis not present

## 2018-03-31 DIAGNOSIS — J3089 Other allergic rhinitis: Secondary | ICD-10-CM | POA: Diagnosis not present

## 2018-04-12 DIAGNOSIS — M5416 Radiculopathy, lumbar region: Secondary | ICD-10-CM | POA: Insufficient documentation

## 2018-04-12 DIAGNOSIS — I10 Essential (primary) hypertension: Secondary | ICD-10-CM | POA: Insufficient documentation

## 2018-04-12 NOTE — Progress Notes (Signed)
Subjective:   Patient ID: [name], female    DOB: 06-Apr-1935, 83 y.o.   MRN: [id number]  [name] is a pleasant 83 y.o. year old female who presents to clinic today with New Patient (Initial Visit) (Patient is here today to establish care.  She is not currently fasting.  BP can only be taken in right arm as she had Left Breast CA with lumpectomy.  She is needing a refill of her Crestor and Hydralazine.) and Neck Pain (She states that she is having problems with DDD in her C-Spine.  She has been going to therapy at [facility] in [place] and has helped some. Her pain is at a 4 right now but will get worse and there is nagging tightness she says.)  on 04/13/2018  HPI:  Here to establish care from Dr. Lenna Gilford.  Has a GYN- saw [name] on 01/15/18.  Note reviewed.  Health Maintenance  Topic Date Due  . TETANUS/TDAP  07/26/2019  . INFLUENZA VACCINE  Completed  . DEXA SCAN  Completed  . PNA vac Low Risk Adult  Completed   CAD s/p CABG- followed by Dr. Johnsie Cancel. She was last seen by him on 11/21/17.  Note reviewed. HLD- she is taking crestor day.   Per his note also she tends to get hyponatremic with diuretics. EKG unchanged at 11/21/17 OV.  Advised to continue ASA, beta blocker and as needed NTG.  Lab Results  Component Value Date   CHOL 193 06/19/2017   HDL 54.80 06/19/2017   LDLCALC 100 (H) 06/19/2017   LDLDIRECT 140.0 06/26/2016   TRIG 192.0 (H) 06/19/2017   CHOLHDL 4 06/19/2017    HTN- has improved with addition of amlodipine to her hydralazine, metoprolol, and cozaar. Higher than it has been running her today but she was nervous to come meet her new doctor.  She also has h/o labile htn (on her problem list). BP Readings from Last 3 Encounters:  04/13/18 140/64  01/15/18 118/80  12/16/17 (!) 120/56   Lab Results  Component Value Date   CREATININE 0.66 06/19/2017    H/o breast CA- followed by Dr. Jana [name] s/p lumpectomy. Last mammogram  07/10/17.  Also has history of persistent, intermittent lumbar radiculopathy. Was seen for this along with headaches and neck pain by Dr. Delice Lesch, neurology, on 10/19/17.  Note reviewed. Prior EMG has confirmed chronic S1 radiculopathy.  Cervical spondylolysis- She did not have much response to occipital nerve blocks, per Dr. Amparo Bristol notes but she did have some improvement with PT. Now seeing [facility] in [place].  Current Outpatient Medications on File Prior to Visit  Medication Sig Dispense Refill  . Acetaminophen (TYLENOL 8 HOUR PO) Take by mouth as directed.     [name] albuterol (VENTOLIN HFA) 108 (90 Base) MCG/ACT inhaler Inhale 2 puffs into the lungs every 6 (six) hours as needed for wheezing or shortness of breath.    . AMBULATORY NON FORMULARY MEDICATION as needed. FDGARD    . amLODipine (NORVASC) 5 MG tablet TAKE 1 TABLET BY MOUTH ONCE DAILY 90 tablet 2  . aspirin EC 81 MG tablet Take 81 mg by mouth daily.    [name] CALCIUM PO Take by mouth.    [name] CARAFATE 1 GM/10ML suspension take 10 milliliters by mouth four times a day with meals and at bedtime 420 mL 2  . EPINEPHrine 0.3 mg/0.3 mL IJ SOAJ injection UAD for severe reaction    . fexofenadine (ALLEGRA) 180 MG tablet Take  180 mg by mouth daily.      . Glucosamine-Chondroit-Vit C-Mn (GLUCOSAMINE CHONDR 1500 COMPLX PO) Take 1 tablet by mouth 3 (three) times daily.      [name] latanoprost (XALATAN) 0.005 % ophthalmic solution Place 1 drop into both eyes at bedtime.     [name] losartan (COZAAR) 100 MG tablet TAKE 1/2 TABLET BY MOUTH THREE TIMES A DAY 45 tablet 2  . magnesium oxide (MAG-OX) 400 MG tablet Take 400 mg by mouth daily. Reported on 02/14/2015    . metoprolol tartrate (LOPRESSOR) 50 MG tablet TAKE 1 TABLET BY MOUTH THREE TIMES A DAY 270 tablet 3  . mometasone (NASONEX) 50 MCG/ACT nasal spray Place 2 sprays into the nose daily as needed (congestion).     . nitroGLYCERIN (NITROSTAT) 0.4 MG SL tablet Place 1 tablet (0.4 mg total)  under the tongue every 5 (five) minutes as needed for chest pain. 25 tablet 2  . Omega-3 Fatty Acids (FISH OIL) 1200 MG CAPS Take 1,200 mg by mouth daily. Reported on 02/14/2015    . Peppermint Oil (IBGARD) 90 MG CPCR Take by mouth as needed.    . Psyllium (METAMUCIL FIBER PO) Take 1 tablet by mouth daily.     . traMADol (ULTRAM) 50 MG tablet Take 1/2 to 1 tablet by mouth three times daily as needed 90 tablet 5  . vitamin B-12 (CYANOCOBALAMIN) 1000 MCG tablet Take 1,000 mcg by mouth daily.      [name] VITAMIN D PO Take by mouth.     No current facility-administered medications on file prior to visit.     Allergies  Allergen Reactions  . Levaquin [Levofloxacin In D5w]     Elevated BP  . Choline Fenofibrate Other (See Comments)     pt states INTOL to Trilipix w/ "thigh burning"  . Hctz [Hydrochlorothiazide]     Causes hyponatremia  . Simvastatin Other (See Comments)     pt states INTOL to STATINS \\T \ refuses to restart  . Adhesive [Tape] Rash  . Bentyl [Dicyclomine Hcl]     Made me feel weird and drained me  . Ceclor [Cefaclor] Rash  . Clarithromycin Rash  . Codeine Nausea Only  . Doxycycline Rash  . Hydrocodone     Nightmare after taking cough syrup w/hydrocodone  . Lisinopril Cough    Developed ACE cough...  . Penicillins Itching and Rash    At injection site  . Tobramycin-Dexamethasone Rash    Past Medical History:  Diagnosis Date  . Asthma   . Atrophic vaginitis   . Breast cancer, Left 12/20/2010   NO BLOOD PRESSURE CHECKS OR STICKS IN LEFT ARM  . CAD (coronary artery disease)    a. 01/19/2010 s/p CABG x 3, lima->lad, vg->diag, vg->om1;  b. 06/2011 :Lexi MV: EF 84%, No ischemia. c. 01/06/14 s/p negative nuclear stress test with EF >70%  . Cervical arthritis   . Colonic polyp 04-27-2009   tubular adenoma  . Diverticulosis of colon (without mention of hemorrhage)   . Fibromyalgia   . Gallstones   . GERD (gastroesophageal reflux disease)   . Glaucoma   . Headache(784.0)     a. frequently assocaited with high BPs  . Hiatal hernia   . Hypercholesterolemia   . Irritable bowel syndrome   . Labile hypertension   . Lymphedema    left arm  . Pinched vertebral nerve     Past Surgical History:  Procedure Laterality Date  . [place] STUDY N/A 01/08/2016   Procedure:  [place] STUDY;  Surgeon: Ladene Artist, MD;  Location: [facility];  Service: Endoscopy;  Laterality: N/A;  . BREAST LUMPECTOMY Left 02/06/11  . CATARACT EXTRACTION, BILATERAL     bilateral caaract removal,  . CORONARY ANGIOPLASTY WITH STENT PLACEMENT     Stent 2007  . CORONARY ARTERY BYPASS GRAFT    . ESOPHAGEAL MANOMETRY N/A 01/08/2016   Procedure: ESOPHAGEAL MANOMETRY (EM);  Surgeon: Ladene Artist, MD;  Location: [facility];  Service: Endoscopy;  Laterality: N/A;  . open heart surgery     01/19/2010    Family History  Problem Relation Age of Onset  . Stroke Mother   . Stroke Father   . Prostate cancer Father   . Breast cancer Sister   . Diabetes Sister   . Cancer Brother        gland cancer  . Heart disease Brother   . Colon cancer Neg Hx   . Stomach cancer Neg Hx   . Pancreatic cancer Neg Hx   . Kidney disease Neg Hx   . Liver disease Neg Hx     Social History   Socioeconomic History  . Marital status: Married    Spouse name: Gildardo Griffes. [place]  . Number of children: 2  . Years of education: 36  . Highest education level: Not on file  Occupational History  . Occupation: part time preschool teacher-retired    Employer: [employer]  . Financial resource strain: Not on file  . Food insecurity:    Worry: Not on file    Inability: Not on file  . Transportation needs:    Medical: Not on file    Non-medical: Not on file  Tobacco Use  . Smoking status: Never Smoker  . Smokeless tobacco: Never Used  Substance and Sexual Activity  . Alcohol use: No    Alcohol/week: 0.0 standard drinks  . Drug use: No  . Sexual activity: Not  Currently    Partners: Male    Birth control/protection: Post-menopausal  Lifestyle  . Physical activity:    Days per week: Not on file    Minutes per session: Not on file  . Stress: Not on file  Relationships  . Social connections:    Talks on phone: Not on file    Gets together: Not on file    Attends religious service: Not on file    Active member of club or organization: Not on file    Attends meetings of clubs or organizations: Not on file    Relationship status: Not on file  . Intimate partner violence:    Fear of current or ex partner: Not on file    Emotionally abused: Not on file    Physically abused: Not on file    Forced sexual activity: Not on file  Other Topics Concern  . Not on file  Social History Narrative   Patient lives at home with her husband Marcello Moores). Patient is retired. Patient  Has 12 th grade education.    Caffeine- sometimes- One cup of coffee.   Right handed.   Four granddaughters.   The PMH, PSH, Social History, Family History, Medications, and allergies have been reviewed in [facility], and have been updated if relevant.   Review of Systems  Constitutional: Negative.   HENT: Negative.   Respiratory: Negative.   Cardiovascular: Negative.   Gastrointestinal: Negative.   Endocrine: Negative.   Genitourinary: Negative.   Musculoskeletal: Positive for arthralgias, back pain and  neck pain.  Skin: Negative.   Allergic/Immunologic: Negative.   Neurological: Positive for numbness.  Hematological: Negative.   Psychiatric/Behavioral: Negative.   All other systems reviewed and are negative.      Objective:    BP 140/64 (BP Location: Right Arm, Patient Position: Sitting, Cuff Size: Normal)   Pulse (!) 57   Temp 97.9 F (36.6 C) (Oral)   Ht 4\' 10"  (1.473 m)   Wt 136 lb (61.7 kg)   LMP 11/26/1990   SpO2 96%   BMI 28.42 kg/m   BP Readings from Last 3 Encounters:  04/13/18 140/64  01/15/18 118/80  12/16/17 (!) 120/56    Physical Exam Vitals  signs and nursing note reviewed.  Constitutional:      Appearance: Normal appearance.  HENT:     Head: Normocephalic and atraumatic.     Nose: Nose normal.  Eyes:     Extraocular Movements: Extraocular movements intact.  Neck:     Musculoskeletal: Normal range of motion.  Cardiovascular:     Rate and Rhythm: Normal rate.  Pulmonary:     Effort: Pulmonary effort is normal.  Musculoskeletal: Normal range of motion.  Skin:    General: Skin is warm and dry.  Neurological:     General: No focal deficit present.     Mental Status: She is alert.  Psychiatric:        Mood and Affect: Mood normal.        Behavior: Behavior normal.        Thought Content: Thought content normal.        Judgment: Judgment normal.           Assessment & Plan:   Coronary artery disease involving native coronary artery of native heart without angina pectoris  OSA on CPAP  S/P CABG (coronary artery bypass graft)  Mixed hyperlipidemia - Plan: Comprehensive metabolic panel, Lipid panel, TSH  Cervical spondylolysis  Essential hypertension  Malignant neoplasm of lower-outer quadrant of left breast of female, estrogen receptor positive ([place])  Lumbar radiculopathy  Labile hypertension No follow-ups on file.

## 2018-04-13 ENCOUNTER — Ambulatory Visit (INDEPENDENT_AMBULATORY_CARE_PROVIDER_SITE_OTHER): Payer: Medicare Other | Admitting: Family Medicine

## 2018-04-13 ENCOUNTER — Encounter: Payer: Self-pay | Admitting: Family Medicine

## 2018-04-13 VITALS — BP 140/64 | HR 57 | Temp 97.9°F | Ht <= 58 in | Wt 136.0 lb

## 2018-04-13 DIAGNOSIS — R0989 Other specified symptoms and signs involving the circulatory and respiratory systems: Secondary | ICD-10-CM | POA: Diagnosis not present

## 2018-04-13 DIAGNOSIS — G4733 Obstructive sleep apnea (adult) (pediatric): Secondary | ICD-10-CM | POA: Diagnosis not present

## 2018-04-13 DIAGNOSIS — M4302 Spondylolysis, cervical region: Secondary | ICD-10-CM | POA: Diagnosis not present

## 2018-04-13 DIAGNOSIS — Z9989 Dependence on other enabling machines and devices: Secondary | ICD-10-CM | POA: Diagnosis not present

## 2018-04-13 DIAGNOSIS — I2510 Atherosclerotic heart disease of native coronary artery without angina pectoris: Secondary | ICD-10-CM

## 2018-04-13 DIAGNOSIS — K2100 Gastro-esophageal reflux disease with esophagitis, without bleeding: Secondary | ICD-10-CM

## 2018-04-13 DIAGNOSIS — Z170 Estrogen receptor positive status [ER+]: Secondary | ICD-10-CM | POA: Diagnosis not present

## 2018-04-13 DIAGNOSIS — C50512 Malignant neoplasm of lower-outer quadrant of left female breast: Secondary | ICD-10-CM

## 2018-04-13 DIAGNOSIS — E782 Mixed hyperlipidemia: Secondary | ICD-10-CM | POA: Diagnosis not present

## 2018-04-13 DIAGNOSIS — M5416 Radiculopathy, lumbar region: Secondary | ICD-10-CM

## 2018-04-13 DIAGNOSIS — Z951 Presence of aortocoronary bypass graft: Secondary | ICD-10-CM | POA: Diagnosis not present

## 2018-04-13 DIAGNOSIS — I10 Essential (primary) hypertension: Secondary | ICD-10-CM

## 2018-04-13 LAB — COMPREHENSIVE METABOLIC PANEL
ALT: 63 U/L — ABNORMAL HIGH (ref 0–35)
AST: 28 U/L (ref 0–37)
Albumin: 4.2 g/dL (ref 3.5–5.2)
Alkaline Phosphatase: 105 U/L (ref 39–117)
BUN: 13 mg/dL (ref 6–23)
CO2: 22 mEq/L (ref 19–32)
CREATININE: 0.76 mg/dL (ref 0.40–1.20)
Calcium: 9.1 mg/dL (ref 8.4–10.5)
Chloride: 105 mEq/L (ref 96–112)
GFR: 73.11 mL/min (ref >60.00)
Glucose, Bld: 81 mg/dL (ref 70–99)
Potassium: 4 mEq/L (ref 3.5–5.1)
SODIUM: 138 meq/L (ref 135–145)
Total Bilirubin: 0.5 mg/dL (ref 0.2–1.2)
Total Protein: 7.2 g/dL (ref 6.0–8.3)

## 2018-04-13 LAB — LIPID PANEL
CHOLESTEROL: 222 mg/dL — AB (ref 0–200)
HDL: 63.2 mg/dL (ref >39.00)
LDL CALC: 140 mg/dL — AB (ref 0–99)
NonHDL: 159.24
Total CHOL/HDL Ratio: 4
Triglycerides: 98 mg/dL (ref 0.0–149.0)
VLDL: 19.6 mg/dL (ref 0.0–40.0)

## 2018-04-13 MED ORDER — HYDRALAZINE HCL 50 MG PO TABS: 25.0000 mg | ORAL_TABLET | Freq: Three times a day (TID) | ORAL | 1 refills | Status: DC

## 2018-04-13 MED ORDER — ROSUVASTATIN CALCIUM 5 MG PO TABS: ORAL_TABLET | ORAL | 5 refills | Status: DC

## 2018-04-13 MED ORDER — SUCRALFATE 1 G PO TABS: 1.0000 g | ORAL_TABLET | Freq: Three times a day (TID) | ORAL | 0 refills | Status: DC

## 2018-04-13 MED FILL — ROSUVASTATIN CALCIUM 5 MG T: 5 | 70 days supply | Qty: 30 | Fill #0

## 2018-04-13 NOTE — Assessment & Plan Note (Signed)
Having more symptoms today.  She has been going to [facility] in [place] which has been helping some.

## 2018-04-13 NOTE — Assessment & Plan Note (Signed)
Mammogram UTD. 

## 2018-04-13 NOTE — Assessment & Plan Note (Signed)
Followed by cardiology, Dr. Cecile Sheerer. On medical management and follow up with him is UTD. Continue ASA, statin, beta blocker with as needed NTG.

## 2018-04-13 NOTE — Addendum Note (Signed)
Addended by: Lucille Passy on: 04/13/2018 11:11 AM   Modules accepted: Orders

## 2018-04-13 NOTE — Assessment & Plan Note (Signed)
Continue current rxs. Asymptomatic and has overall been controlled.

## 2018-04-13 NOTE — Assessment & Plan Note (Signed)
Followed by Dr. Fuller Plan. carfate liquid too expensive. I have sent in carafate tablets and have advised to follow up with Dr. Fuller Plan.

## 2018-04-13 NOTE — Assessment & Plan Note (Signed)
Chronic intermittent issue. > 30 minutes spent in face to face time with patient, >50% spent in counselling or coordination of care discussing her medical history and treatments.

## 2018-04-13 NOTE — Assessment & Plan Note (Signed)
On crestor.  Due for labs today.

## 2018-04-13 NOTE — Patient Instructions (Addendum)
Great to meet you.  I will call you with your lab results from today and you can view them online.   You are due for you next wellness visit in 12/2018.  Let me know how the carafate tablets work for you. Follow up with Fuller Plan if that doesn't work.

## 2018-04-14 DIAGNOSIS — J3089 Other allergic rhinitis: Secondary | ICD-10-CM | POA: Diagnosis not present

## 2018-04-14 DIAGNOSIS — J301 Allergic rhinitis due to pollen: Secondary | ICD-10-CM | POA: Diagnosis not present

## 2018-04-15 LAB — TSH: TSH: 1.44 u[IU]/mL (ref 0.35–4.50)

## 2018-04-30 DIAGNOSIS — J301 Allergic rhinitis due to pollen: Secondary | ICD-10-CM | POA: Diagnosis not present

## 2018-04-30 DIAGNOSIS — J3089 Other allergic rhinitis: Secondary | ICD-10-CM | POA: Diagnosis not present

## 2018-05-12 ENCOUNTER — Encounter: Payer: Self-pay | Admitting: Physician Assistant

## 2018-05-12 ENCOUNTER — Other Ambulatory Visit: Payer: Self-pay

## 2018-05-12 ENCOUNTER — Ambulatory Visit (INDEPENDENT_AMBULATORY_CARE_PROVIDER_SITE_OTHER): Payer: Medicare Other | Admitting: Physician Assistant

## 2018-05-12 VITALS — BP 136/64 | HR 64 | Temp 97.5°F | Ht <= 58 in | Wt 136.0 lb

## 2018-05-12 DIAGNOSIS — R1013 Epigastric pain: Secondary | ICD-10-CM | POA: Diagnosis not present

## 2018-05-12 DIAGNOSIS — R140 Abdominal distension (gaseous): Secondary | ICD-10-CM

## 2018-05-12 DIAGNOSIS — I2510 Atherosclerotic heart disease of native coronary artery without angina pectoris: Secondary | ICD-10-CM | POA: Diagnosis not present

## 2018-05-12 DIAGNOSIS — K219 Gastro-esophageal reflux disease without esophagitis: Secondary | ICD-10-CM | POA: Diagnosis not present

## 2018-05-12 MED ORDER — SUCRALFATE 1 G PO TABS: ORAL_TABLET | ORAL | 3 refills | Status: DC

## 2018-05-12 MED ORDER — PANTOPRAZOLE SODIUM 40 MG PO TBEC: 40.0000 mg | DELAYED_RELEASE_TABLET | Freq: Every day | ORAL | 3 refills | Status: DC

## 2018-05-12 NOTE — Progress Notes (Signed)
Reviewed and agree with initial management plan.  Misaki Sozio T. Chakara Bognar, MD FACG 

## 2018-05-12 NOTE — Patient Instructions (Signed)
If you are age 81 or older, your body mass index should be between 23-30. Your Body mass index is 28.42 kg/m. If this is out of the aforementioned range listed, please consider follow up with your Primary Care Provider.  If you are age 31 or younger, your body mass index should be between 19-25. Your Body mass index is 28.42 kg/m. If this is out of the aformentioned range listed, please consider follow up with your Primary Care Provider.   We have sent the following medications to your pharmacy for you to pick up at your convenience: Pantoprazole Sucralfate  You have been given samples of FDgard.  Take one tab twice daily for one month.  (over-the-counter)  Follow up in one month.  The schedule is not available at this time.  Please call the office in a week or two for an appointment.  Thank you for choosing me and [facility].    Ellouise Newer, PA-C

## 2018-05-12 NOTE — Progress Notes (Signed)
Chief Complaint: Epigastric pain, bloating  HPI:    [name] is an 83 year old Caucasian female, known to Dr. Fuller Plan, with a history of IBS and GERD, who presents to clinic today for epigastric pain and bloating.    06/29/2014 colonoscopy with 2 sessile polyps in the descending and transverse colon, moderate diverticulosis in the sigmoid colon otherwise normal.  Repeat was not recommended due to age.  EGD on the same day was normal.    12/16/2017 last office visit with [name] to discuss left lower quadrant pain.  At that time was diagnosed with suspected diverticulitis and given Cipro and Flagyl.  Her pain improved.    04/13/2018 office visit with PCP.  Her GERD was discussed.  At that time Carafate liquid was too expensive and they sent in Carafate tablets instead.  She was advised to follow-up with [place].    Today, the patient tells me that over the past couple months she has had an increase in epigastric pain/gnawing discomfort rated as a 2-3/10 which comes off and on, typically worse after eating.  She has been using her FD guard off and on as well as IBgard offand on, but nothing consistently.  Currently using Carafate 1 g tab grams tabs twice daily, typically before breakfast and dinner.  Does tell me she takes all of her other medications before breakfast as well.  Also on Omeprazole over-the-counter daily before breakfast.    Denies fever, chills, weight loss, nausea, vomiting, change in bowel habits or symptoms that awaken her from sleep.  Past Medical History:  Diagnosis Date  . Asthma   . Atrophic vaginitis   . Breast cancer, Left 12/20/2010   NO BLOOD PRESSURE CHECKS OR STICKS IN LEFT ARM  . CAD (coronary artery disease)    a. 01/19/2010 s/p CABG x 3, lima->lad, vg->diag, vg->om1;  b. 06/2011 :Lexi MV: EF 84%, No ischemia. c. 01/06/14 s/p negative nuclear stress test with EF >70%  . Cervical arthritis   . Colonic polyp 04-27-2009   tubular adenoma  . Diverticulosis of colon  (without mention of hemorrhage)   . Fibromyalgia   . Gallstones   . GERD (gastroesophageal reflux disease)   . Glaucoma   . Headache(784.0)    a. frequently assocaited with high BPs  . Hiatal hernia   . Hypercholesterolemia   . Irritable bowel syndrome   . Labile hypertension   . Lymphedema    left arm  . Pinched vertebral nerve     Past Surgical History:  Procedure Laterality Date  . [place] STUDY N/A 01/08/2016   Procedure: [place] STUDY;  Surgeon: Ladene Artist, MD;  Location: [facility];  Service: Endoscopy;  Laterality: N/A;  . BREAST LUMPECTOMY Left 02/06/11  . CATARACT EXTRACTION, BILATERAL     bilateral caaract removal,  . CORONARY ANGIOPLASTY WITH STENT PLACEMENT     Stent 2007  . CORONARY ARTERY BYPASS GRAFT    . ESOPHAGEAL MANOMETRY N/A 01/08/2016   Procedure: ESOPHAGEAL MANOMETRY (EM);  Surgeon: Ladene Artist, MD;  Location: [facility];  Service: Endoscopy;  Laterality: N/A;  . open heart surgery     01/19/2010    Current Outpatient Medications  Medication Sig Dispense Refill  . Acetaminophen (TYLENOL 8 HOUR PO) Take by mouth as directed.     [name] albuterol (VENTOLIN HFA) 108 (90 Base) MCG/ACT inhaler Inhale 2 puffs into the lungs every 6 (six) hours as needed for wheezing or shortness of breath.    [name]  AMBULATORY NON FORMULARY MEDICATION as needed. FDGARD    . amLODipine (NORVASC) 5 MG tablet TAKE 1 TABLET BY MOUTH ONCE DAILY 90 tablet 2  . aspirin EC 81 MG tablet Take 81 mg by mouth daily.    [name] CALCIUM PO Take by mouth.    . EPINEPHrine 0.3 mg/0.3 mL IJ SOAJ injection UAD for severe reaction    . fexofenadine (ALLEGRA) 180 MG tablet Take 180 mg by mouth daily.      . Glucosamine-Chondroit-Vit C-Mn (GLUCOSAMINE CHONDR 1500 COMPLX PO) Take 1 tablet by mouth 3 (three) times daily.      . hydrALAZINE (APRESOLINE) 50 MG tablet Take 0.5 tablets (25 mg total) by mouth 3 (three) times daily. 135 tablet 1  . latanoprost (XALATAN) 0.005 % ophthalmic solution  Place 1 drop into both eyes at bedtime.     [name] losartan (COZAAR) 100 MG tablet TAKE 1/2 TABLET BY MOUTH THREE TIMES A DAY 45 tablet 2  . magnesium oxide (MAG-OX) 400 MG tablet Take 400 mg by mouth daily. Reported on 02/14/2015    . metoprolol tartrate (LOPRESSOR) 50 MG tablet TAKE 1 TABLET BY MOUTH THREE TIMES A DAY 270 tablet 3  . mometasone (NASONEX) 50 MCG/ACT nasal spray Place 2 sprays into the nose daily as needed (congestion).     . nitroGLYCERIN (NITROSTAT) 0.4 MG SL tablet Place 1 tablet (0.4 mg total) under the tongue every 5 (five) minutes as needed for chest pain. 25 tablet 2  . Omega-3 Fatty Acids (FISH OIL) 1200 MG CAPS Take 1,200 mg by mouth daily. Reported on 02/14/2015    . Peppermint Oil (IBGARD) 90 MG CPCR Take by mouth as needed.    . Psyllium (METAMUCIL FIBER PO) Take 1 tablet by mouth daily.     . rosuvastatin (CRESTOR) 5 MG tablet TAKE 1 TABLET BY MOUTH 3 TIMES A WEEK. (TAKE ON MONDAY, WEDNESDAY, AND FRIDAY) 30 tablet 5  . sucralfate (CARAFATE) 1 g tablet Take 1 tablet (1 g total) by mouth 4 (four) times daily -  with meals and at bedtime. 30 tablet 0  . traMADol (ULTRAM) 50 MG tablet Take 1/2 to 1 tablet by mouth three times daily as needed 90 tablet 5  . vitamin B-12 (CYANOCOBALAMIN) 1000 MCG tablet Take 1,000 mcg by mouth daily.      [name] VITAMIN D PO Take by mouth.     No current facility-administered medications for this visit.     Allergies as of 05/12/2018 - Review Complete 04/13/2018  Allergen Reaction Noted  . Levaquin [levofloxacin in d5w]  12/30/2013  . Choline fenofibrate Other (See Comments)   . Hctz [hydrochlorothiazide]  01/14/2014  . Simvastatin Other (See Comments)   . Adhesive [tape] Rash 01/14/2011  . Bentyl [dicyclomine hcl]  05/16/2015  . Ceclor [cefaclor] Rash 07/25/2010  . Clarithromycin Rash 05/10/2010  . Codeine Nausea Only 04/13/2009  . Doxycycline Rash 07/25/2010  . Hydrocodone  02/06/2011  . Lisinopril Cough   . Penicillins Itching and  Rash 04/13/2009  . Tobramycin-dexamethasone Rash 07/25/2010    Family History  Problem Relation Age of Onset  . Stroke Mother   . Stroke Father   . Prostate cancer Father   . Breast cancer Sister   . Diabetes Sister   . Cancer Brother        gland cancer  . Heart disease Brother   . Colon cancer Neg Hx   . Stomach cancer Neg Hx   . Pancreatic cancer Neg Hx   .  Kidney disease Neg Hx   . Liver disease Neg Hx     Social History   Socioeconomic History  . Marital status: Married    Spouse name: [name]  . Number of children: 2  . Years of education: 56  . Highest education level: Not on file  Occupational History  . Occupation: part time preschool teacher-retired    Employer: [employer]  . Financial resource strain: Not on file  . Food insecurity:    Worry: Not on file    Inability: Not on file  . Transportation needs:    Medical: Not on file    Non-medical: Not on file  Tobacco Use  . Smoking status: Never Smoker  . Smokeless tobacco: Never Used  Substance and Sexual Activity  . Alcohol use: No    Alcohol/week: 0.0 standard drinks  . Drug use: No  . Sexual activity: Not Currently    Partners: Male    Birth control/protection: Post-menopausal  Lifestyle  . Physical activity:    Days per week: Not on file    Minutes per session: Not on file  . Stress: Not on file  Relationships  . Social connections:    Talks on phone: Not on file    Gets together: Not on file    Attends religious service: Not on file    Active member of club or organization: Not on file    Attends meetings of clubs or organizations: Not on file    Relationship status: Not on file  . Intimate partner violence:    Fear of current or ex partner: Not on file    Emotionally abused: Not on file    Physically abused: Not on file    Forced sexual activity: Not on file  Other Topics Concern  . Not on file  Social History Narrative   Patient lives at home  with her husband [name]). Patient is retired. Patient  Has 12 th grade education.    Caffeine- sometimes- One cup of coffee.   Right handed.   Four granddaughters.    Review of Systems:    Constitutional: No weight loss, fever or chills Cardiovascular: No chest pain Respiratory: No SOB  Gastrointestinal: See HPI and otherwise negative   Physical Exam:  Vital signs: BP 136/64   Pulse 64   Temp (!) 97.5 F (36.4 C) (Other (Comment))   Ht 4\' 10"  (1.473 m)   Wt 136 lb (61.7 kg)   LMP 11/26/1990   BMI 28.42 kg/m   Constitutional:   Pleasant Caucasian female appears to be in NAD, Well developed, Well nourished, alert and cooperative  Respiratory: Respirations even and unlabored. Lungs clear to auscultation bilaterally.   No wheezes, crackles, or rhonchi.  Cardiovascular: Normal S1, S2. No MRG. Regular rate and rhythm. No peripheral edema, cyanosis or pallor.  Gastrointestinal:  Soft, nondistended, mild epigastric ttp. No rebound or guarding. Normal bowel sounds. No appreciable masses or hepatomegaly. Rectal:  Not performed.  Psychiatric:  Demonstrates good judgement and reason without abnormal affect or behaviors.  No recent labs or imaging.  Assessment: 1. Epigastric pain: Gnawing sensation per the patient, likely related to gastritis/reflux 2. Bloating: With the above and likely IBS 3. GERD: Increase symptoms with bloating and pain as above  Plan: 1.  Recommend the patient start her FD guard 1 tab twice daily over the next month 2.  Stop Omeprazole.  Prescribed Pantoprazole 40 mg daily, 30-60 minutes before breakfast.  #30  with 3 refills. 3.  Discussed taking Carafate with the patient.  It is hard for her to time this in the morning with all of her other medication.  Would recommend that she just take this 20 minutes before dinner.  Refilled prescription 4.  Reviewed antireflux diet and lifestyle modifications. 5.  Patient to follow in clinic in 1 month or sooner if necessary.   Ellouise Newer, PA-C [facility] 05/12/2018, 2:53 PM  Cc: [name] Passy, MD

## 2018-05-21 DIAGNOSIS — J3089 Other allergic rhinitis: Secondary | ICD-10-CM | POA: Diagnosis not present

## 2018-05-21 DIAGNOSIS — J301 Allergic rhinitis due to pollen: Secondary | ICD-10-CM | POA: Diagnosis not present

## 2018-05-25 ENCOUNTER — Ambulatory Visit: Payer: Medicare Other | Admitting: Nurse Practitioner

## 2018-05-25 ENCOUNTER — Telehealth: Payer: Self-pay

## 2018-05-25 NOTE — Telephone Encounter (Signed)
I called pt and discussed this with her. Pt would prefer to delay her appt until June, rather than do a virtual or telephone visit. Pt's appt was rescheduled for 08/20/2018. Pt verbalized understanding of new appt date and time.

## 2018-05-25 NOTE — Telephone Encounter (Signed)
Due to current COVID 19 pandemic, our office is severely reducing in office visits for at least the next 2 weeks, in order to minimize the risk to our patients and healthcare providers.   I called pt to discuss. Home phone number rings busy. Will try again later.

## 2018-05-26 ENCOUNTER — Ambulatory Visit: Payer: Medicare Other | Admitting: Neurology

## 2018-06-01 MED FILL — AMLODIPINE BESYLATE 5 MG TA: 5 | 90 days supply | Qty: 90 | Fill #1

## 2018-06-04 ENCOUNTER — Ambulatory Visit: Payer: Medicare Other | Admitting: Neurology

## 2018-06-18 DIAGNOSIS — J4520 Mild intermittent asthma, uncomplicated: Secondary | ICD-10-CM | POA: Diagnosis not present

## 2018-06-18 DIAGNOSIS — J3089 Other allergic rhinitis: Secondary | ICD-10-CM | POA: Diagnosis not present

## 2018-06-18 DIAGNOSIS — J301 Allergic rhinitis due to pollen: Secondary | ICD-10-CM | POA: Diagnosis not present

## 2018-06-18 DIAGNOSIS — K219 Gastro-esophageal reflux disease without esophagitis: Secondary | ICD-10-CM | POA: Diagnosis not present

## 2018-06-22 ENCOUNTER — Other Ambulatory Visit: Payer: Self-pay

## 2018-06-24 ENCOUNTER — Encounter: Payer: Self-pay | Admitting: Gastroenterology

## 2018-06-24 ENCOUNTER — Other Ambulatory Visit: Payer: Self-pay

## 2018-06-24 ENCOUNTER — Ambulatory Visit (INDEPENDENT_AMBULATORY_CARE_PROVIDER_SITE_OTHER): Payer: Medicare Other | Admitting: Gastroenterology

## 2018-06-24 VITALS — Ht <= 58 in | Wt 136.0 lb

## 2018-06-24 DIAGNOSIS — R140 Abdominal distension (gaseous): Secondary | ICD-10-CM

## 2018-06-24 DIAGNOSIS — R1013 Epigastric pain: Secondary | ICD-10-CM | POA: Diagnosis not present

## 2018-06-24 DIAGNOSIS — I2510 Atherosclerotic heart disease of native coronary artery without angina pectoris: Secondary | ICD-10-CM | POA: Diagnosis not present

## 2018-06-24 DIAGNOSIS — K219 Gastro-esophageal reflux disease without esophagitis: Secondary | ICD-10-CM | POA: Diagnosis not present

## 2018-06-24 MED ORDER — PANTOPRAZOLE SODIUM 40 MG PO TBEC: 40.0000 mg | DELAYED_RELEASE_TABLET | Freq: Two times a day (BID) | ORAL | 11 refills | Status: DC

## 2018-06-24 NOTE — Progress Notes (Signed)
    History of Present Illness: This is an 83 year old female with IBS and GERD.  She was seen in the office in March by Ellouise Newer, PA-C for epigastric pain and bloating.  Omeprazole was discontinued and pantoprazole 40 mg daily was started.  FDgard 1 tablet twice daily was started and Carafate was continued once daily.  She notes a significant improvement in symptoms however epigastric discomfort and bloating persists. She notes increased gas.  She is not sure which medication is been the most beneficial however they all seem to be helping.  Current Medications, Allergies, Past Medical History, Past Surgical History, Family History and Social History were reviewed in [employer] record.   Physical Exam: Telemedicine visit-not performed   Assessment and Recommendations:  1.  Epigastric pain and bloating.  Suspected GERD, IBS and gas.  Follow antireflux measures.  Increase pantoprazole to 40 mg twice daily.  Begin Gas-X 3 times daily as needed. If symptoms not adequately controlled increase FDgard to 1-2 3 times daily.  If symptoms not controlled increase Carafate to 3 times daily.  If symptoms not well controlled consider RUQ [place] and EGD for further evaluation. REV in 1 month.    These services were provided via telemedicine, audio only per patient request.  The patient was at home and the provider was in the office, alone.  We discussed the limitations of evaluation and management by telemedicine and the availability of in person appointments.  Patient consented for this telemedicine visit and is aware of possible charges for this service.  The other person participating in the telemedicine service was Marlon Pel, [place] who reviewed medications, allergies, past history and completed AVS.  Time spent on call: 8 minutes

## 2018-06-24 NOTE — Patient Instructions (Signed)
Patient advised to avoid spicy, acidic, citrus, chocolate, mints, fruit and fruit juices.  Limit the intake of caffeine, alcohol and Soda.  Don't exercise too soon after eating.  Don't lie down within 3-4 hours of eating.  Elevate the head of your bed.  Increase your pantoprazole 40 mg to one tablet by mouth twice daily. A new prescription will be sent to your pharmacy.   Take over the counter Gas-X three times a day as needed for gas and bloating symptoms.   If your symptoms are not adequately controlled start over the counter FD guard taking 1-2 capsules by mouth three times daily. If still not under control then increase Carafate to 3 times daily.   Thank you for choosing me and [facility].  Pricilla Riffle. Dagoberto Ligas., MD., [facility] Regal

## 2018-07-03 ENCOUNTER — Telehealth: Payer: Self-pay | Admitting: Neurology

## 2018-07-03 NOTE — Telephone Encounter (Signed)
Spoke with pt, informed her per Dr. Delice Lesch to go to the ER if her symptoms get worse or she has weakness on one side, pt verbalized understanding, message sent to the front to call her to reschedule pt appointment for earlier one

## 2018-07-03 NOTE — Telephone Encounter (Signed)
[place] for earlier appointment, but pls let her know if she starts having worsening symptoms or weakness on one side, would go to ER. Thanks

## 2018-07-03 NOTE — Telephone Encounter (Signed)
Patient is calling in about her right sided arms and leg falling asleep. She was wanting to know what to do and asked about an earlier appt than her 09/21/18. Please call her back. Thanks!

## 2018-07-06 MED FILL — ROSUVASTATIN CALCIUM 5 MG T: 5 | 70 days supply | Qty: 30 | Fill #1

## 2018-07-07 ENCOUNTER — Other Ambulatory Visit: Payer: Self-pay

## 2018-07-07 ENCOUNTER — Encounter: Payer: Self-pay | Admitting: Neurology

## 2018-07-07 ENCOUNTER — Telehealth (INDEPENDENT_AMBULATORY_CARE_PROVIDER_SITE_OTHER): Payer: Medicare Other | Admitting: Neurology

## 2018-07-07 ENCOUNTER — Telehealth: Payer: Medicare Other | Admitting: Neurology

## 2018-07-07 VITALS — BP 120/60

## 2018-07-07 DIAGNOSIS — R200 Anesthesia of skin: Secondary | ICD-10-CM

## 2018-07-07 NOTE — Progress Notes (Signed)
Virtual Visit via Video Note The purpose of this virtual visit is to provide medical care while limiting exposure to the novel coronavirus.    Consent was obtained for video visit:  Yes.   Answered questions that patient had about telehealth interaction:  Yes.   I discussed the limitations, risks, security and privacy concerns of performing an evaluation and management service by telemedicine. I also discussed with the patient that there may be a patient responsible charge related to this service. The patient expressed understanding and agreed to proceed.  Pt location: Home Physician Location: office Name of referring provider:  Lucille Passy, MD I connected with [name] at patients initiation/request on 07/07/2018 at  3:00 PM EDT by video enabled telemedicine application and verified that I am speaking with the correct person using two identifiers. Pt MRN:  [id number] Pt DOB:  [birth year] Video Participants:  [name]   History of Present Illness:  The patient was last seen in July 2019 for headaches and neck pain. She had previously reported right hand and foot numbness and had an EMG/NCV in May 2019 showing moderate right carpal tunnel syndrome and chronic right S1 radiculopathy. She presents for an urgent visit today due to right-sided numbness that started on 07/03/2018. She states it feels different than her prior symptoms, "more like it is asleep." There is not much tingling, it feels like she had sat on it. Numbness started on her right leg down to her foot, then her right hand and arm, mostly the right hand. She feels the numbness on her right cheek across her face, no slurred speech or facial droop. She feels like there is a hot wire going down her right shoulder to elbow. She has pain above her ankle and top of her foot, more on the 2nd digit. She takes Tylenol to help with the aggravating feeling in her arm/leg, and alternates cold and hot on her back. She has on and  off back pain and continues to have neck pain with tightness around her head. She states that symptoms are a little better over the past few days but still present. She denies any significant weakness, she can grip but cannot hold on for long periods. Left side is okay,but sometimes her left arm seems to want to be asleep. Her BP today is 120/60.   Laboratory Data: Lipid Panel     Component Value Date/Time   CHOL 222 (H) 04/13/2018 1116   TRIG 98.0 04/13/2018 1116   TRIG 204 (HH) 02/28/2006 0804   HDL 63.20 04/13/2018 1116   CHOLHDL 4 04/13/2018 1116   VLDL 19.6 04/13/2018 1116   LDLCALC 140 (H) 04/13/2018 1116   LDLDIRECT 140.0 06/26/2016 0821   HPI: This is a pleasant 83 yo RH woman with a history of hypertension, hyperlipidemia, CAD, breast cancer s/p lumpectomy, chemotherapy and radiation, with daily headaches. She had been seeing neurologist Dr. Krista Blue since 2014. Records were reviewed. Ms. Vencill reports a history of migraines since her 81s. These quieted down, until 2014 when she started having headaches that she reports were different from her migraines in the past. It starts in the bilateral occipital region, her neck feels strange, "like it's about to break in the middle," then radiates to the frontal region like a tight band of pressure. This is associated with nausea. Her last migraine was 1 year ago. She was reporting 15 headaches days a month. MRI cervical spine had shown multilevel degenerative disc disease, mild right  foraminal stenosis at C3-4 secondary to asymmetric right-sided facet hypertrophy and uncovertebral disease, facet hypertrophy and disc osteophyte complexes at C4-5 with mild central canal narrowing, mild to moderate left central canal stenosis C5-6 without significant foraminal disease, mild central and left foraminal stenosis at C6-7 secondary to a broad-based disc osteophyte complex and uncovertebral disease, mild left foraminal narrowing at C7-C1 secondary to facet  disease. ESR normal. In 2015, she was admitted for hyponatremia and headaches. Headaches had increased in frequency, described as a burning and pressure sensation. Ultram would help some. She was noted to have elevated blood pressure during times of headaches. According to notes, on her last visit in August 2016, she had reported improvement in the headaches but still frequent, and was offered the option of an occipital nerve block.   She reports having the headaches constantly, waxing and waning in intensity from 3/10 to 10/10. She takes Tramadol or Arthritis Tylenol, applying ice helps. She chews ginger for the nausea. In the past, seeing her chiropractor used to help, but not anymore. She had PT for neck pain in 2015 and was told her neck was so tight. She feels slightly better lying down. She has had balance problems but denies any recent falls in the past year. She reports that her BP is overall better except this week when she has had more intense headaches. She started using a CPAP machine last June but has not noticed much change and still does not feel rested on awakening.     Observations/Objective:   Vitals:   07/07/18 1512  BP: 120/60   GEN:  The patient appears stated age and is in NAD. Neurological examination: Patient is awake, alert, oriented x 3. No aphasia or dysarthria. Intact fluency and comprehension. Remote and recent memory intact. Able to name and repeat. Cranial nerves: Extraocular movements intact with no nystagmus. No facial asymmetry. Motor: moves all extremities symmetrically, at least anti-gravity x 4. She notes different sensation/numbness on fingertips of right hand when doing fine finger movements. No incoordination on finger to nose testing. Gait: narrow-based and steady, able to tandem walk adequately. Negative Romberg test.  Assessment and Plan:   This is a pleasant 83 yo RH woman with a history of hypertension, sleep apnea, migraines, presenting for urgent visit for  right-sided numbness for the past 4 days. In the past she has complained of right hand and foot and EMG had shown right carpal tunnel syndrome and right S1 radiculopathy, however she states symptoms are different and now affect the right side of her face as well. With vascular risk factors, small vessel stroke is a concern, MRI brain without contrast will be ordered. We discussed the importance of control of vascular risk factors, continue aspirin '81mg'$  daily. We discussed that if symptoms worsen, she should go to the ER immediately. If MRI negative for stroke, we will plan for PT at [facility] for radiculopathy. She will follow-up as scheduled in July 2020.  Follow Up Instructions:   -I discussed the assessment and treatment plan with the patient. The patient was provided an opportunity to ask questions and all were answered. The patient agreed with the plan and demonstrated an understanding of the instructions.   The patient was advised to call back or seek an in-person evaluation if the symptoms worsen or if the condition fails to improve as anticipated.    Cameron Sprang, MD

## 2018-07-21 ENCOUNTER — Telehealth: Payer: Self-pay | Admitting: General Surgery

## 2018-07-21 NOTE — Telephone Encounter (Signed)
Left a voicemail for the patient to contact the office to pre-screen for her virtual appointment on 07/22/2018

## 2018-07-21 NOTE — Telephone Encounter (Signed)
Patient returned your call and would like another call.

## 2018-07-22 ENCOUNTER — Telehealth: Payer: Self-pay

## 2018-07-22 ENCOUNTER — Other Ambulatory Visit: Payer: Self-pay

## 2018-07-22 ENCOUNTER — Ambulatory Visit (INDEPENDENT_AMBULATORY_CARE_PROVIDER_SITE_OTHER): Payer: Medicare Other | Admitting: Gastroenterology

## 2018-07-22 ENCOUNTER — Encounter: Payer: Self-pay | Admitting: Gastroenterology

## 2018-07-22 VITALS — Ht <= 58 in | Wt 136.0 lb

## 2018-07-22 DIAGNOSIS — K219 Gastro-esophageal reflux disease without esophagitis: Secondary | ICD-10-CM

## 2018-07-22 DIAGNOSIS — I2510 Atherosclerotic heart disease of native coronary artery without angina pectoris: Secondary | ICD-10-CM | POA: Diagnosis not present

## 2018-07-22 DIAGNOSIS — R1013 Epigastric pain: Secondary | ICD-10-CM | POA: Diagnosis not present

## 2018-07-22 NOTE — Telephone Encounter (Signed)
Pre screened patient for phone visit today

## 2018-07-22 NOTE — Progress Notes (Signed)
    History of Present Illness: This is an 83 year old female for follow-up of epigastric pain.  Her symptoms have improved on pantoprazole twice daily.  She states FD guard and Carafate are also somewhat helpful in controlling symptoms however she feels Carafate has been more effective.  She is also tried Mylanta which has been effective.  She describes the symptoms as intermittent, mild and gnawing.   Current Medications, Allergies, Past Medical History, Past Surgical History, Family History and Social History were reviewed in [employer] record.   Physical Exam: Telemedicine - not performed    Assessment and Recommendations:  1. Epigastric pain. Suspected GERD, gastritis however symptoms have not resolved. Continue Protonix 40 mg po bid. Mylanta qid prn. Carafate tid prn. FDgard 1-2 tid prn. Recommended proceeding with EGD however she would like to continue her current treatment before considering EGD as she has made some improvement. Discussed proceeding with EGD if symptoms not resolved by her next visit.  Telemedicine REV in 1 month.  2. IBS, mild. Continue IBgard 1-2 tid prn.     These services were provided via telemedicine, audio only per patient request.  The patient was at homeand the provider was in the office, alone.  We discussed the limitations of evaluation and management by telemedicine and the availability of in person appointments.  Patient consented for this telemedicine visit and is aware of possible charges for this service.  The other person participating in the telemedicine service was a [place] who reviewed medications, allergies, past history and completed AVS.  Time spent on call: 8 minutes

## 2018-07-22 NOTE — Patient Instructions (Addendum)
Continue taking Protonix twice a day.  Take Mylanta four times a day as needed.  Take Carafate three times a day as needed.  Take over the counter FDgard, 1-2 capsules three times a day as needed.  Continue IBgard, 1-2 three times a day as needed  You have been rescheduled for a follow up telephone visit on 08/24/2018 at 2:30pm

## 2018-07-25 ENCOUNTER — Other Ambulatory Visit: Payer: Self-pay

## 2018-07-25 ENCOUNTER — Ambulatory Visit
Admission: RE | Admit: 2018-07-25 | Discharge: 2018-07-25 | Disposition: A | Discharge status: Home / Self Care | Payer: Medicare Other | Source: Ambulatory Visit | Attending: Neurology | Admitting: Neurology

## 2018-07-25 DIAGNOSIS — R200 Anesthesia of skin: Secondary | ICD-10-CM | POA: Diagnosis not present

## 2018-07-28 ENCOUNTER — Telehealth: Payer: Self-pay

## 2018-07-28 NOTE — Telephone Encounter (Signed)
Pt called informed the MRI brain looked good, no evidence of stroke, tumor, or bleed. Pt asked how was she feeling? she stated that" at times she has trouble sleeping and doesn't know why but she just goes with the flow."

## 2018-07-28 NOTE — Telephone Encounter (Signed)
Noted, thanks!

## 2018-08-03 ENCOUNTER — Telehealth: Payer: Self-pay | Admitting: Family Medicine

## 2018-08-03 NOTE — Telephone Encounter (Unsigned)
Copied from [place] [phone]. Topic: Quick Communication - See Telephone Encounter >> Aug 03, 2018 11:16 AM [place]oma [place] wrote: CRM for notification. See Telephone encounter for: 08/03/18.losartan (COZAAR) 100 MG tablet  [id number] - [address], [address] AT [address] [phone] (Phone) [phone] (Fax) Established care in Feb 2020. Failed to give her this one, now she is out. Please refill

## 2018-08-05 NOTE — Telephone Encounter (Signed)
Pt called to check on refill status for Losartan/ please advise

## 2018-08-06 ENCOUNTER — Other Ambulatory Visit: Payer: Self-pay

## 2018-08-06 MED ORDER — LOSARTAN POTASSIUM 100 MG PO TABS: ORAL_TABLET | ORAL | 0 refills | Status: DC

## 2018-08-06 NOTE — Telephone Encounter (Signed)
Rx sent to pharmacy/thx dmf 

## 2018-08-18 ENCOUNTER — Telehealth: Payer: Self-pay | Admitting: Neurology

## 2018-08-18 NOTE — Telephone Encounter (Signed)
Due to current COVID 19 pandemic, our office is severely reducing in office visits until further notice, in order to minimize the risk to our patients and healthcare providers.   I called patient and offered a virtual visit for her 6/25 appt. Patient declined a virtual visit as she does not have the resources for this type of visit. Patient will come in office and verbalized understanding of the precautions our office is taking for in office visits.   Pt understands that although there may be some limitations with this type of visit, we will take all precautions to reduce any security or privacy concerns.  Pt understands that this will be treated like an in office visit and we will file with pt's insurance, and there may be a patient responsible charge related to this service.

## 2018-08-20 ENCOUNTER — Encounter: Payer: Self-pay | Admitting: Neurology

## 2018-08-20 ENCOUNTER — Ambulatory Visit (INDEPENDENT_AMBULATORY_CARE_PROVIDER_SITE_OTHER): Payer: Medicare Other | Admitting: Neurology

## 2018-08-20 ENCOUNTER — Other Ambulatory Visit: Payer: Self-pay

## 2018-08-20 VITALS — BP 154/73 | HR 64 | Temp 98.2°F | Ht <= 58 in | Wt 136.0 lb

## 2018-08-20 DIAGNOSIS — G4733 Obstructive sleep apnea (adult) (pediatric): Secondary | ICD-10-CM

## 2018-08-20 DIAGNOSIS — Z9989 Dependence on other enabling machines and devices: Secondary | ICD-10-CM | POA: Diagnosis not present

## 2018-08-20 DIAGNOSIS — I2510 Atherosclerotic heart disease of native coronary artery without angina pectoris: Secondary | ICD-10-CM | POA: Diagnosis not present

## 2018-08-20 NOTE — Progress Notes (Signed)
Subjective:    Patient ID: [name] is a 83 y.o. female.  HPI     Interim history:   [name] is a very pleasant 83 year old right-handed woman with an underlying medical history of fibromyalgia, coronary artery disease, status post CABG, irritable bowel syndrome, diverticulosis, breast cancer, hyperlipidemia, glaucoma, hypertension and overweight state, who presents for follow-up consultation of her obstructive sleep apnea, on treatment with CPAP, for her yearly check up. The patient is unaccompanied today. I last saw her on 05/21/2017, at which time she was fully compliant with her CPAP and advised to follow-up in 1 year.   Today, 08/20/2018: I reviewed her CPAP compliance data from 07/20/2018 through 08/18/2018 which is a total of 30 days, during which time she used her machine every night with percent used days greater than 4 hours at 97%, indicating excellent compliance with an average usage of 7 hours and 22 minutes, residual AHI at goal at 0.5/h, leak on the low side with a 95th percentile at 4.1 L/min on a pressure of 6 cm with EPR.   She reports tolerating her CPAP, some nights are easier to use than other.  She uses nasal pillows, she is up-to-date with her supplies, DME company is [employer].  She has graduated from oncology follow-up thankfully.  She has intermittent issues with left upper extremity lymphedema.  She has been going to a medical massage practice intermittently and has an appointment coming up.  Thankfully, she has had no recent acute illnesses.  She tries to hydrate well with water, she is active.    The patient's allergies, current medications, family history, past medical history, past social history, past surgical history and problem list were reviewed and updated as appropriate.    Previously (copied from previous notes for reference):   I saw her on 04/25/2017, at which time she was compliant with CPAP therapy. She reported that she sometimes would forget to  put the mask back on but generally she was doing okay. She was finishing her tamoxifen after being on it for 5 years. She had some weight loss.    I reviewed her CPAP compliance data from 04/20/2017 through 05/19/2017 which is a total of 30 days, during which time she used her machine every night with percent used days greater than 4 hours at 100%, indicating superb compliance with an average usage of 7 hours and 30 minutes, residual AHI at goal at 0.6 per hour, leak from the very low side with the 95th percentile at 0.7 L/m on a pressure of 6 cm with EPR of 3.    I saw her on 04/26/2015, at which time she was compliant with CPAP therapy, she was doing fairly well. We talked about her sleep study results from 04/25/2014, she had trigger point injection under Dr. Tomi Likens, was referred to physical therapy and had 2 treatments with dry needling. I suggested a new checkup from the sleep apnea standpoint. She had an appointment pending with Dr. Delice Lesch.    I reviewed her CPAP compliance data from 03/25/2016 through 04/23/2016, which is a total of 30 days, during which time she used her CPAP every night with percent used days greater than 4 hours at 93%, indicating excellent compliance with an average usage of 6 hours and 49 minutes, residual AHI low at 0.5 per hour, leak low for the 95th percentile at 1.4 L/m on a pressure of 6 cm with EPR.    I first met her on 10/27/2014 at the request  of Dr. Krista Blue and Cecille Rubin, at which time the patient was advised regarding her split-night sleep study results from 04/25/2014. She had evidence of moderate obstructive sleep apnea. She did well with CPAP. She was compliant with CPAP therapy at the time and indicated improvement of her sleep including feeling better rested and having less daytime somnolence. Headaches were about the same and she was supposed to start seeing Dr. Delice Lesch for this.     I reviewed her CPAP compliance data from 03/26/2015 through 04/24/2015 which is  a total of 30 days during which time she used her machine every night with percent used days greater than 4 hours at 100%, indicating superb compliance with an average usage of 7 hours and 1 minute, residual AHI low at 0.6 per hour, leak low with the 95th percentile at 0.9 L/m at a pressure of 6 cm with EPR.   10/27/2014: She was referred for a sleep study. She reported morning headaches, nocturia, nonrestorative sleep and excessive daytime somnolence as well as snoring. She had a split-night sleep study on 04/25/2014 and I went over her test results with her in detail today. Her baseline sleep efficiency was reduced at 53.5% with a latency to sleep of 42.5 minutes and wake after sleep onset of 61 minutes with moderate to severe sleep fragmentation noted. She had a markedly elevated arousal index. She had an increased percentage of light stage sleep and absence of slow-wave sleep and REM sleep. She had PLMS at 62 per hour, with minimal arousals. She had mild to moderate snoring. Total AHI was 16.1 per hour. Average oxygen saturation was 95%, nadir was 85%. She was then titrated on CPAP. Sleep efficiency was 83.3%, latency to sleep 9 minutes and wake after sleep onset 16.5 minutes with mild sleep fragmentation noted. She had slow-wave sleep at 8.3% in rem sleep at 25.6%. Average oxygen saturation was 98%, nadir was 96%. Snoring was eliminated. She had a PLM index of 43.5 per hour with no arousals. She was titrated from 5 cm to 6 cm. AHI was 0 per hour on the final pressure with supine REM sleep achieved. Based on her test results are prescribed CPAP therapy for home use.   I reviewed her CPAP compliance data from 09/26/2014 through 10/25/2014 which is a total of 30 days during which time she used her machine every night with percent used days greater than 4 hours at 97%, indicating excellent compliance with an average usage of 7 hours and 4 minutes, residual AHI low at syrup 0.5 per hour, leak low with the 95th  percentile at 3.8 L/m on a pressure of 6 cm.   She reports doing fairly well. Overall, she feels a little bit better rested when she uses CPAP and still is struggling somewhat with the mask. She uses nasal pillows. She started CPAP therapy in mid June. She had some questions about the machine and how to clean the house and so forth and I tried to answer them but I also asked her to get in touch with her DME company, [employer]. As far as her bedtime goes, she tries to be in bed between 10 and 10:30. Her rise time is 6 to 6:30 AM. She used to have restless leg symptoms even as a child. Her husband has reported to her that her twitching has improved. She does not endorse bothersome restless leg symptoms at this time. Her headaches are about the same. Her nocturia is still about the same, 2-3 times in  an average night. She does not drink alcohol and does not drink caffeine on a regular basis and is a nonsmoker. She lives with her husband. She has 2 grown daughters and 4 granddaughters.  Her Past Medical History Is Significant For: Past Medical History:  Diagnosis Date  . Asthma   . Atrophic vaginitis   . Breast cancer, Left 12/20/2010   NO BLOOD PRESSURE CHECKS OR STICKS IN LEFT ARM  . CAD (coronary artery disease)    a. 01/19/2010 s/p CABG x 3, lima->lad, vg->diag, vg->om1;  b. 06/2011 :Lexi MV: EF 84%, No ischemia. c. 01/06/14 s/p negative nuclear stress test with EF >70%  . Cervical arthritis   . Colonic polyp 04-27-2009   tubular adenoma  . Diverticulosis of colon (without mention of hemorrhage)   . Fibromyalgia   . Gallstones   . GERD (gastroesophageal reflux disease)   . Glaucoma   . Headache(784.0)    a. frequently assocaited with high BPs  . Hiatal hernia   . Hypercholesterolemia   . Irritable bowel syndrome   . Labile hypertension   . Lymphedema    left arm  . Pinched vertebral nerve     Her Past Surgical History Is Significant For: Past Surgical History:  Procedure Laterality Date   . [place] STUDY N/A 01/08/2016   Procedure: [place] STUDY;  Surgeon: Ladene Artist, MD;  Location: [facility];  Service: Endoscopy;  Laterality: N/A;  . BREAST LUMPECTOMY Left 02/06/11  . CATARACT EXTRACTION, BILATERAL     bilateral caaract removal,  . CORONARY ANGIOPLASTY WITH STENT PLACEMENT     Stent 2007  . CORONARY ARTERY BYPASS GRAFT    . ESOPHAGEAL MANOMETRY N/A 01/08/2016   Procedure: ESOPHAGEAL MANOMETRY (EM);  Surgeon: Ladene Artist, MD;  Location: [facility];  Service: Endoscopy;  Laterality: N/A;  . open heart surgery     01/19/2010    Her Family History Is Significant For: Family History  Problem Relation Age of Onset  . Stroke Mother   . Stroke Father   . Prostate cancer Father   . Breast cancer Sister   . Diabetes Sister   . Cancer Brother        gland cancer  . Heart disease Brother   . Colon cancer Neg Hx   . Stomach cancer Neg Hx   . Pancreatic cancer Neg Hx   . Kidney disease Neg Hx   . Liver disease Neg Hx     Her Social History Is Significant For: Social History   Socioeconomic History  . Marital status: Married    Spouse name: Gildardo Griffes. Arbaugh  . Number of children: 2  . Years of education: 24  . Highest education level: Not on file  Occupational History  . Occupation: part time preschool teacher-retired    Employer: [employer]  . Financial resource strain: Not on file  . Food insecurity    Worry: Not on file    Inability: Not on file  . Transportation needs    Medical: Not on file    Non-medical: Not on file  Tobacco Use  . Smoking status: Never Smoker  . Smokeless tobacco: Never Used  Substance and Sexual Activity  . Alcohol use: No    Alcohol/week: 0.0 standard drinks  . Drug use: No  . Sexual activity: Not Currently    Partners: Male    Birth control/protection: Post-menopausal  Lifestyle  . Physical activity    Days  per week: Not on file    Minutes per session: Not on file  .  Stress: Not on file  Relationships  . Social Herbalist on phone: Not on file    Gets together: Not on file    Attends religious service: Not on file    Active member of club or organization: Not on file    Attends meetings of clubs or organizations: Not on file    Relationship status: Not on file  Other Topics Concern  . Not on file  Social History Narrative   Patient lives at home with her husband Marcello Moores). Patient is retired. Patient  Has 12 th grade education.    Caffeine- sometimes- One cup of coffee.   Right handed.   Four granddaughters.    Her Allergies Are:  Allergies  Allergen Reactions  . Levaquin [Levofloxacin In D5w]     Elevated BP  . Choline Fenofibrate Other (See Comments)     pt states INTOL to Trilipix w/ "thigh burning"  . Hctz [Hydrochlorothiazide]     Causes hyponatremia  . Simvastatin Other (See Comments)     pt states INTOL to STATINS \T_0 refuses to restart  . Adhesive [Tape] Rash  . Bentyl [Dicyclomine Hcl]     Made me feel weird and drained me  . Ceclor [Cefaclor] Rash  . Clarithromycin Rash  . Codeine Nausea Only  . Doxycycline Rash  . Hydrocodone     Nightmare after taking cough syrup w/hydrocodone  . Lisinopril Cough    Developed ACE cough...  . Penicillins Itching and Rash    At injection site  . Tobramycin-Dexamethasone Rash  :   Her Current Medications Are:  Outpatient Encounter Medications as of 08/20/2018  Medication Sig  . albuterol (VENTOLIN HFA) 108 (90 Base) MCG/ACT inhaler Inhale 2 puffs into the lungs every 6 (six) hours as needed for wheezing or shortness of breath.  . AMBULATORY NON FORMULARY MEDICATION as needed. FDGARD  . amLODipine (NORVASC) 5 MG tablet TAKE 1 TABLET BY MOUTH ONCE DAILY  . aspirin EC 81 MG tablet Take 81 mg by mouth daily.  [name] CALCIUM PO Take by mouth.  . EPINEPHrine 0.3 mg/0.3 mL IJ SOAJ injection UAD for severe reaction  . fexofenadine (ALLEGRA) 180 MG tablet Take 180 mg by mouth daily.    .  Glucosamine-Chondroit-Vit C-Mn (GLUCOSAMINE CHONDR 1500 COMPLX PO) Take 1 tablet by mouth 3 (three) times daily.    . hydrALAZINE (APRESOLINE) 50 MG tablet Take 0.5 tablets (25 mg total) by mouth 3 (three) times daily.  [name] latanoprost (XALATAN) 0.005 % ophthalmic solution Place 1 drop into both eyes at bedtime.   [name] losartan (COZAAR) 100 MG tablet TAKE 1/2 TABLET BY MOUTH THREE TIMES A DAY  . metoprolol tartrate (LOPRESSOR) 50 MG tablet TAKE 1 TABLET BY MOUTH THREE TIMES A DAY  . mometasone (NASONEX) 50 MCG/ACT nasal spray Place 2 sprays into the nose daily as needed (congestion).   . nitroGLYCERIN (NITROSTAT) 0.4 MG SL tablet Place 1 tablet (0.4 mg total) under the tongue every 5 (five) minutes as needed for chest pain.  . pantoprazole (PROTONIX) 40 MG tablet Take 1 tablet (40 mg total) by mouth 2 (two) times daily. Take 30-60 minutes before breakfast  . Psyllium (METAMUCIL FIBER PO) Take 1 tablet by mouth daily.   . rosuvastatin (CRESTOR) 5 MG tablet TAKE 1 TABLET BY MOUTH 3 TIMES A WEEK. (TAKE ON MONDAY, [place], AND FRIDAY)  . sucralfate (CARAFATE) 1  g tablet Take one before dinner  . traMADol (ULTRAM) 50 MG tablet Take 1/2 to 1 tablet by mouth three times daily as needed  . vitamin B-12 (CYANOCOBALAMIN) 1000 MCG tablet Take 1,000 mcg by mouth daily.    [name] VITAMIN D PO Take by mouth.   No facility-administered encounter medications on file as of 08/20/2018.   :  Review of Systems:  Out of a complete 14 point review of systems, all are reviewed and negative with the exception of these symptoms as listed below: Review of Systems  Neurological:       Pt presents today to discuss her cpap. Pt reports that her cpap is going well.    Objective:  Neurological Exam  Physical Exam Physical Examination:   Vitals:   08/20/18 0928  BP: (!) 154/73  Pulse: 64  Temp: 98.2 F (36.8 C)    General Examination: The patient is a very pleasant 83 y.o. female in no acute distress. She appears  well-developed and well-nourished and well groomed.   HEENT:Normocephalic, atraumatic, pupils are equal, round and reactive to light and accommodation. Corrective eye glasses in place. Extraocular tracking is good without limitation to gaze excursion or nystagmus noted. Normal smooth pursuit is noted. Hearing is grossly intact. Face is symmetric with normal facial animation and normal facial sensation. Speech is clear with no dysarthria noted. There is no hypophonia. There is no lip, neck/head, jaw or voice tremor. Neck shows fairly good ROM. Oropharynx exam reveals:mildto moderate mouth dryness, adequatedental hygiene and mildairway crowding. Mallampati is class II. Tongue protrudes centrally and palate elevates symmetrically.  Chest:Clear to auscultation without wheezing, rhonchi or crackles noted.  Heart:S1+S2+0, regular and normal without murmurs, rubs or gallops noted.   Abdomen:Soft, non-tender and non-distended with normal bowel sounds appreciated on auscultation.  Extremities:There isnopitting edema in the distal lower extremities bilaterally.   Skin: Warm and dry without trophic changes noted.  Musculoskeletal: exam reveals no obvious joint deformities, tenderness or joint swelling or erythema.   Neurologically:  Mental status: The patient is awake, alert and oriented in all 4 spheres.Herimmediate and remote memory, attention, language skills and fund of knowledge are appropriate. There is no evidence of aphasia, agnosia, apraxia or anomia. Speech is clear with normal prosody and enunciation. Thought process is linear. Mood is normaland affect is normal.  Cranial nerves II - XII are as described above under HEENT exam. In addition: shoulder shrug is normal with equal shoulder height noted. Motor exam: Normal bulk, strength and tone is noted. There is no tremor. Fine motor skills and coordination: grossly intact.  Romberg is negative. Cerebellar testing: No  dysmetria or intention tremor. No gait ataxia.  Sensory exam: intact to light touch in the upper and lower extremities.  Gait, station and balance:Shestands easily. No veering to one side is noted. No leaning to one side is noted. Posture is age-appropriate and stance is narrow based. Gait showsnormalstride length and normalpace.   Assessmentand Plan:  In summary,Tamkia M Venableis a very pleasant 60 year oldfemalewith an underlying medical history of coronary artery disease with status post CABG, irritable bowel syndrome, reflux disease, diverticulosis, history of breast cancer, fibromyalgia, hyperlipidemia, neck pain, glaucoma, hypertension, recurrent headaches and overweight state, who presents for follow-up consultation of her OSA, well established on CPAP therapy since 08/16/2014 with full compliance and ongoing good results.She is doing well, exam is stable. She is commended for her excellent treatment adherence with CPAP. She noted benefit when she started treatment in the sense that  she felt more rested and less tired during the day. Her average usage is nearly 7-1/2 hours, which is great. I placed an order for updating her CPAP supplies which we will fax to her DME company. From my end of things she is doing rather well. I suggested a yearly checkup for sleep apnea. She can see one of our nurse practitioners next time. I answered all her questions today and she was in agreement. She is encouraged to call for any interim problems with her CPAP machine or sleep apnea. I spent 25 minutes in total face-to-face time with the patient, more than 50% of which was spent in counseling and coordination of care, reviewing test results, reviewing medication and discussing or reviewing the diagnosis of OSA, its prognosis and treatment options. Pertinent laboratory and imaging test results that were available during this visit with the patient were reviewed by me and considered in my medical decision  making (see chart for details).

## 2018-08-20 NOTE — Patient Instructions (Signed)
Please continue using your CPAP regularly. While your insurance requires that you use CPAP at least 4 hours each night on 70% of the nights, I recommend, that you not skip any nights and use it throughout the night if you can. Getting used to CPAP and staying with the treatment long term does take time and patience and discipline. Untreated obstructive sleep apnea when it is moderate to severe can have an adverse impact on cardiovascular health and raise her risk for heart disease, arrhythmias, hypertension, congestive heart failure, stroke and diabetes. Untreated obstructive sleep apnea causes sleep disruption, nonrestorative sleep, and sleep deprivation. This can have an impact on your day to day functioning and cause daytime sleepiness and impairment of cognitive function, memory loss, mood disturbance, and problems focussing. Using CPAP regularly can improve these symptoms.  Keep up the good work! We can see you in 1 year, you can see one of our nurse practitioners as you are stable. 

## 2018-08-20 NOTE — Progress Notes (Signed)
Order for cpap supplies sent to [facility] via fax. Confirmation received that the order transmitted was successful.

## 2018-08-21 ENCOUNTER — Other Ambulatory Visit: Payer: Self-pay

## 2018-08-21 MED ORDER — SUCRALFATE 1 G PO TABS: 1.0000 g | ORAL_TABLET | Freq: Two times a day (BID) | ORAL | 1 refills | Status: DC

## 2018-08-24 ENCOUNTER — Ambulatory Visit (INDEPENDENT_AMBULATORY_CARE_PROVIDER_SITE_OTHER): Payer: Medicare Other | Admitting: Gastroenterology

## 2018-08-24 ENCOUNTER — Encounter: Payer: Self-pay | Admitting: Gastroenterology

## 2018-08-24 VITALS — Ht <= 58 in | Wt 136.0 lb

## 2018-08-24 DIAGNOSIS — K589 Irritable bowel syndrome without diarrhea: Secondary | ICD-10-CM | POA: Diagnosis not present

## 2018-08-24 DIAGNOSIS — R1013 Epigastric pain: Secondary | ICD-10-CM

## 2018-08-24 DIAGNOSIS — R140 Abdominal distension (gaseous): Secondary | ICD-10-CM

## 2018-08-24 DIAGNOSIS — I2510 Atherosclerotic heart disease of native coronary artery without angina pectoris: Secondary | ICD-10-CM | POA: Diagnosis not present

## 2018-08-24 NOTE — Patient Instructions (Signed)
Continue pantoprazole 40 mg twice daily and FDgard 1-2 three times a day as needed.  Also continue Carafate twice daily and Mylanta as needed.   Thank you for choosing me and [facility].  Pricilla Riffle. Dagoberto Ligas., MD., Marval Regal

## 2018-08-24 NOTE — Progress Notes (Signed)
    History of Present Illness: This is an 83 year old female with IBS and GERD. She returns with improved but persistent epigastric pain, bloating, gas. FDgard and Gas-X have helped. Mild intermittent lower abdominal pain responds to IBgard   Current Medications, Allergies, Past Medical History, Past Surgical History, Family History and Social History were reviewed in [employer] record.   Physical Exam: Telemedicine - not performed   Assessment and Recommendations:  1. Epigastric pain, bloating, gas, GERD and dyspepsia. We reviewed her prior imaging and endoscopic studies performed in 2016, 2017 and 2018. Offered RUQ [place] and if negative then EGD to further evaluate her symptoms. She declines and states she will call back if symptoms worsen.  Continue pantoprazole 40 mg po bid, FDgard 1-2 tid prn and Gas-X tid prn. Continue Carafate bid prn and Mylanta prn.   2. IBS. IBgard 1 - 2 po tid prn.    These services were provided via telemedicine, audio only per patient request.  The patient was at home and the provider was in the office, alone.  We discussed the limitations of evaluation and management by telemedicine and the availability of in person appointments.  Patient consented for this telemedicine visit and is aware of possible charges for this service.  Office CMA or LPN participated in this telemedicine service.  Time spent on call: 8 minutes

## 2018-09-11 MED FILL — AMLODIPINE BESYLATE 5 MG TA: 5 | 90 days supply | Qty: 90 | Fill #2

## 2018-09-21 ENCOUNTER — Encounter: Payer: Self-pay | Admitting: Neurology

## 2018-09-21 ENCOUNTER — Other Ambulatory Visit: Payer: Self-pay

## 2018-09-21 ENCOUNTER — Telehealth (INDEPENDENT_AMBULATORY_CARE_PROVIDER_SITE_OTHER): Payer: Medicare Other | Admitting: Neurology

## 2018-09-21 VITALS — BP 125/67 | Ht <= 58 in | Wt 136.0 lb

## 2018-09-21 DIAGNOSIS — M25551 Pain in right hip: Secondary | ICD-10-CM

## 2018-09-21 DIAGNOSIS — M5417 Radiculopathy, lumbosacral region: Secondary | ICD-10-CM

## 2018-09-21 DIAGNOSIS — R200 Anesthesia of skin: Secondary | ICD-10-CM

## 2018-09-21 DIAGNOSIS — G5601 Carpal tunnel syndrome, right upper limb: Secondary | ICD-10-CM

## 2018-09-21 NOTE — Progress Notes (Signed)
Virtual Visit via Telephone Note The purpose of this virtual visit is to provide medical care while limiting exposure to the novel coronavirus.    Consent was obtained for phone visit:  Yes.   Answered questions that patient had about telehealth interaction:  Yes.   I discussed the limitations, risks, security and privacy concerns of performing an evaluation and management service by telephone. I also discussed with the patient that there may be a patient responsible charge related to this service. The patient expressed understanding and agreed to proceed.  Pt location: Home Physician Location: office Name of referring provider:  Noralee Space, MD I connected with .[name] at patients initiation/request on 09/21/2018 at 10:00 AM EDT by telephone and verified that I am speaking with the correct person using two identifiers.  Pt MRN:  [id number] Pt DOB:  1935-03-04   History of Present Illness:  The patient had a phone visit on 09/21/2018. She was last evaluated as a virtual video visit 2 months ago when she reported right-sided numbness affecting the right side of her face, arm, and leg. She felt like there was a hot wife going down her right shoulder to elbow. She had an MRI brain without contrast on 06/2018 which did not show any evidence of stroke, no acute changes, there was moderate chronic microvascular disease. She had previously reported right hand and foot numbness and had an EMG/NCV in May 2019 showing moderate right carpal tunnel syndrome and chronic right S1 radiculopathy. She states the numbness is still off and on, it does not get as numb in her right leg and foot, but her right hip and leg are still bothersome. Her arm is also bothering her. She has some neck pain. The headaches are not as bad. She had a fall last month when her legs were tired and she did not life her foot coming to the back door. No injuries or loss of consciousness. No bowel/bladder dysfunction. She has  been sleeping better with her CPAP. She has been having coughing recently, no fever. She has seen a chiropractor and Ortho who did xrays and mentioned injections for her hip, which she declined.   HPI: This is a pleasant 83 yo RH woman with a history of hypertension, hyperlipidemia, CAD, breast cancer s/p lumpectomy, chemotherapy and radiation, with daily headaches. She had been seeing neurologist Dr. Krista Blue since 2014. Records were reviewed. Ms. Kramar reports a history of migraines since her 84s. These quieted down, until 2014 when she started having headaches that she reports were different from her migraines in the past. It starts in the bilateral occipital region, her neck feels strange, "like it's about to break in the middle," then radiates to the frontal region like a tight band of pressure. This is associated with nausea. Her last migraine was 1 year ago. She was reporting 15 headaches days a month. MRI cervical spine had shown multilevel degenerative disc disease, mild right foraminal stenosis at C3-4 secondary to asymmetric right-sided facet hypertrophy and uncovertebral disease, facet hypertrophy and disc osteophyte complexes at C4-5 with mild central canal narrowing, mild to moderate left central canal stenosis C5-6 without significant foraminal disease, mild central and left foraminal stenosis at C6-7 secondary to a broad-based disc osteophyte complex and uncovertebral disease, mild left foraminal narrowing at C7-C1 secondary to facet disease. ESR normal. In 2015, she was admitted for hyponatremia and headaches. Headaches had increased in frequency, described as a burning and pressure sensation. Ultram would help some.  She was noted to have elevated blood pressure during times of headaches. According to notes, on her last visit in August 2016, she had reported improvement in the headaches but still frequent, and was offered the option of an occipital nerve block.   She reports having the headaches  constantly, waxing and waning in intensity from 3/10 to 10/10. She takes Tramadol or Arthritis Tylenol, applying ice helps. She chews ginger for the nausea. In the past, seeing her chiropractor used to help, but not anymore. She had PT for neck pain in 2015 and was told her neck was so tight. She feels slightly better lying down. She has had balance problems but denies any recent falls in the past year. She reports that her BP is overall better except this week when she has had more intense headaches. She started using a CPAP machine last June but has not noticed much change and still does not feel rested on awakening.    Observations/Objective:  Limited due to nature of phone visit. Patient is awake, alert, able to answer questions without dysarthria or confusion noted.   Assessment and Plan:   This is a pleasant 83 yo RH woman with a history of hypertension, sleep apnea, migraines, with right-sided numbness and pain. MRI brain in May 2020 did not show any acute changes. A prior EMG had shown right carpal tunnel and right S1 radiculopathy. With continued right-sided symptoms, we agreed to proceed with physical therapy. If no improvement, she was encouraged to follow-up with Ortho as well. She will follow-up in 6 months and knows to call for any changes.   Follow Up Instructions:   -I discussed the assessment and treatment plan with the patient. The patient was provided an opportunity to ask questions and all were answered. The patient agreed with the plan and demonstrated an understanding of the instructions.   The patient was advised to call back or seek an in-person evaluation if the symptoms worsen or if the condition fails to improve as anticipated.    Total Time spent in visit with the patient was:  12 minutes, of which 100% of the time was spent in counseling and/or coordinating care on the above.   Pt understands and agrees with the plan of care outlined.     Cameron Sprang, MD

## 2018-09-25 ENCOUNTER — Ambulatory Visit (INDEPENDENT_AMBULATORY_CARE_PROVIDER_SITE_OTHER): Payer: Medicare Other | Admitting: Family Medicine

## 2018-09-25 ENCOUNTER — Encounter: Payer: Self-pay | Admitting: Family Medicine

## 2018-09-25 DIAGNOSIS — B9789 Other viral agents as the cause of diseases classified elsewhere: Secondary | ICD-10-CM | POA: Diagnosis not present

## 2018-09-25 DIAGNOSIS — J329 Chronic sinusitis, unspecified: Secondary | ICD-10-CM | POA: Diagnosis not present

## 2018-09-25 NOTE — Progress Notes (Signed)
[name] - 83 y.o. female MRN [id number]  Date of birth: 07-03-1935   This visit type was conducted due to national recommendations for restrictions regarding the COVID-19 Pandemic (e.g. social distancing).  This format is felt to be most appropriate for this patient at this time.  All issues noted in this document were discussed and addressed.  No physical exam was performed (except for noted visual exam findings with Video Visits).  I discussed the limitations of evaluation and management by telemedicine and the availability of in person appointments. The patient expressed understanding and agreed to proceed.  I connected with@ on 09/25/18 at 10:00 AM EDT by a video enabled telemedicine application and verified that I am speaking with the correct person using two identifiers.   Interactive audio and video telecommunications were attempted between this provider and patient, however failed, due to patient having technical difficulties OR patient did not have access to video capability.  We continued and completed visit with audio only.    Patient Location: [facility] [place] [address]   Provider location:   Home office  Chief Complaint  Patient presents with   Headache    pt states having a headache/ cough sometimes/ congested at times/ feels just bad and fatigued/lump feeling in throat/ 1 week / allegra and salt water spray    HPI  [name] is a 83 y.o. female who presents via audio/video conferencing for a telehealth visit today.  She reports 5 day history of mild congestion with intermittent cough.  She has also had a mild headache. She has sinus pressure but denies pain/tenderness. She denies fever, chills, shortness of breath, body aches, rash.  She has had some improvement with allegra and saline spray.  She does feel fatigued today. Has felt fatigued when her sodium levels have been low in the past so is a bit concerned about this.  She denies any sick  contacts and reports only being around her daughter and their family but everyone wore masks during the visits.     ROS:  A comprehensive ROS was completed and negative except as noted per HPI  Past Medical History:  Diagnosis Date   Asthma    Atrophic vaginitis    Breast cancer, Left 12/20/2010   NO BLOOD PRESSURE CHECKS OR STICKS IN LEFT ARM   CAD (coronary artery disease)    a. 01/19/2010 s/p CABG x 3, lima->lad, vg->diag, vg->om1;  b. 06/2011 :Lexi MV: EF 84%, No ischemia. c. 01/06/14 s/p negative nuclear stress test with EF >70%   Cervical arthritis    Colonic polyp 04-27-2009   tubular adenoma   Diverticulosis of colon (without mention of hemorrhage)    Fibromyalgia    Gallstones    GERD (gastroesophageal reflux disease)    Glaucoma    Headache(784.0)    a. frequently assocaited with high BPs   Hiatal hernia    Hypercholesterolemia    Irritable bowel syndrome    Labile hypertension    Lymphedema    left arm   Pinched vertebral nerve     Past Surgical History:  Procedure Laterality Date   65 HOUR [place] STUDY N/A 01/08/2016   Procedure: 24 HOUR [place] STUDY;  Surgeon: Ladene Artist, MD;  Location: [facility];  Service: Endoscopy;  Laterality: N/A;   BREAST LUMPECTOMY Left 02/06/11   CATARACT EXTRACTION, BILATERAL     bilateral caaract removal,   CORONARY ANGIOPLASTY WITH STENT PLACEMENT     Stent 2007  CORONARY ARTERY BYPASS GRAFT     ESOPHAGEAL MANOMETRY N/A 01/08/2016   Procedure: ESOPHAGEAL MANOMETRY (EM);  Surgeon: Ladene Artist, MD;  Location: [facility];  Service: Endoscopy;  Laterality: N/A;   open heart surgery     01/19/2010    Family History  Problem Relation Age of Onset   Stroke Mother    Stroke Father    Prostate cancer Father    Breast cancer Sister    Diabetes Sister    Cancer Brother        gland cancer   Heart disease Brother    Colon cancer Neg Hx    Stomach cancer Neg Hx    Pancreatic cancer Neg Hx      Kidney disease Neg Hx    Liver disease Neg Hx     Social History   Socioeconomic History   Marital status: Married    Spouse name: [name]. [place]   Number of children: 2   Years of education: 12   Highest education level: Not on file  Occupational History   Occupation: part time preschool teacher-retired    Employer: [employer] resource strain: Not on file   Food insecurity    Worry: Not on file    Inability: Not on file   Transportation needs    Medical: Not on file    Non-medical: Not on file  Tobacco Use   Smoking status: Never Smoker   Smokeless tobacco: Never Used  Substance and Sexual Activity   Alcohol use: No    Alcohol/week: 0.0 standard drinks   Drug use: No   Sexual activity: Not Currently    Partners: Male    Birth control/protection: Post-menopausal  Lifestyle   Physical activity    Days per week: Not on file    Minutes per session: Not on file   Stress: Not on file  Relationships   Social connections    Talks on phone: Not on file    Gets together: Not on file    Attends religious service: Not on file    Active member of club or organization: Not on file    Attends meetings of clubs or organizations: Not on file    Relationship status: Not on file   Intimate partner violence    Fear of current or ex partner: Not on file    Emotionally abused: Not on file    Physically abused: Not on file    Forced sexual activity: Not on file  Other Topics Concern   Not on file  Social History Narrative   Patient lives at home with her husband Marcello Moores). Patient is retired. Patient  Has 12 th grade education.    Caffeine- sometimes- One cup of coffee.   Right handed.   Four granddaughters.     Current Outpatient Medications:    albuterol (VENTOLIN HFA) 108 (90 Base) MCG/ACT inhaler, Inhale 2 puffs into the lungs every 6 (six) hours as needed for wheezing or shortness of breath., Disp: , Rfl:     AMBULATORY NON FORMULARY MEDICATION, as needed. FDGARD, Disp: , Rfl:    aspirin EC 81 MG tablet, Take 81 mg by mouth daily., Disp: , Rfl:    CALCIUM PO, Take by mouth., Disp: , Rfl:    EPINEPHrine 0.3 mg/0.3 mL IJ SOAJ injection, UAD for severe reaction, Disp: , Rfl:    fexofenadine (ALLEGRA) 180 MG tablet, Take 180 mg by mouth daily.  , Disp: ,  Rfl:    Glucosamine-Chondroit-Vit C-Mn (GLUCOSAMINE CHONDR 1500 COMPLX PO), Take 1 tablet by mouth 3 (three) times daily.  , Disp: , Rfl:    hydrALAZINE (APRESOLINE) 50 MG tablet, Take 0.5 tablets (25 mg total) by mouth 3 (three) times daily., Disp: 135 tablet, Rfl: 1   latanoprost (XALATAN) 0.005 % ophthalmic solution, Place 1 drop into both eyes at bedtime. , Disp: , Rfl:    losartan (COZAAR) 100 MG tablet, TAKE 1/2 TABLET BY MOUTH THREE TIMES A DAY, Disp: 135 tablet, Rfl: 0   metoprolol tartrate (LOPRESSOR) 50 MG tablet, TAKE 1 TABLET BY MOUTH THREE TIMES A DAY, Disp: 270 tablet, Rfl: 3   mometasone (NASONEX) 50 MCG/ACT nasal spray, Place 2 sprays into the nose daily as needed (congestion). , Disp: , Rfl:    nitroGLYCERIN (NITROSTAT) 0.4 MG SL tablet, Place 1 tablet (0.4 mg total) under the tongue every 5 (five) minutes as needed for chest pain., Disp: 25 tablet, Rfl: 2   pantoprazole (PROTONIX) 40 MG tablet, Take 1 tablet (40 mg total) by mouth 2 (two) times daily. Take 30-60 minutes before breakfast, Disp: 60 tablet, Rfl: 11   Psyllium (METAMUCIL FIBER PO), Take 1 tablet by mouth daily. , Disp: , Rfl:    rosuvastatin (CRESTOR) 5 MG tablet, TAKE 1 TABLET BY MOUTH 3 TIMES A WEEK. (TAKE ON MONDAY, WEDNESDAY, AND FRIDAY), Disp: 30 tablet, Rfl: 5   sucralfate (CARAFATE) 1 g tablet, Take 1 tablet (1 g total) by mouth 2 (two) times daily. Take one before dinner, Disp: 60 tablet, Rfl: 1   traMADol (ULTRAM) 50 MG tablet, Take 1/2 to 1 tablet by mouth three times daily as needed, Disp: 90 tablet, Rfl: 5   vitamin B-12 (CYANOCOBALAMIN) 1000  MCG tablet, Take 1,000 mcg by mouth daily.  , Disp: , Rfl:    VITAMIN D PO, Take by mouth., Disp: , Rfl:   EXAM:  VITALS per patient if applicable: BP 671/24    Pulse 64    LMP 11/26/1990   GENERAL: alert, oriented, appears well and in no acute distress  HEENT: atraumatic, conjunttiva clear, no obvious abnormalities on inspection of external nose and ears  NECK: normal movements of the head and neck  LUNGS: on inspection no signs of respiratory distress, breathing rate appears normal, no obvious gross SOB, gasping or wheezing  CV: no obvious cyanosis  MS: moves all visible extremities without noticeable abnormality  PSYCH/NEURO: pleasant and cooperative, no obvious depression or anxiety, speech and thought processing grossly intact  ASSESSMENT AND PLAN:  Discussed the following assessment and plan:  Viral sinusitis -Symptoms are consistent with viral etiology, allergy component may be contributing as well.  -Discussed that I think COVID is less likely with current symptoms but should she develop any worsening symptoms to please contact us ASAP.   -Recommend rest and increased fluids.  She will continue saline spray and allegra.  She may add on plain mucinex as well.         I discussed the assessment and treatment plan with the patient. The patient was provided an opportunity to ask questions and all were answered. The patient agreed with the plan and demonstrated an understanding of the instructions.   The patient was advised to call back or seek an in-person evaluation if the symptoms worsen or if the condition fails to improve as anticipated.  I provided 25 minutes of non-face-to-face time during this encounter.   Luetta Nutting, DO

## 2018-09-25 NOTE — Assessment & Plan Note (Signed)
-  Symptoms are consistent with viral etiology, allergy component may be contributing as well.  -Discussed that I think COVID is less likely with current symptoms but should she develop any worsening symptoms to please contact us ASAP.   -Recommend rest and increased fluids.  She will continue saline spray and allegra.  She may add on plain mucinex as well.

## 2018-09-29 ENCOUNTER — Other Ambulatory Visit: Payer: Self-pay

## 2018-09-29 ENCOUNTER — Encounter: Payer: Self-pay | Admitting: Physical Therapy

## 2018-09-29 ENCOUNTER — Ambulatory Visit: Payer: Medicare Other | Attending: Neurology | Admitting: Physical Therapy

## 2018-09-29 DIAGNOSIS — M5441 Lumbago with sciatica, right side: Secondary | ICD-10-CM

## 2018-09-29 DIAGNOSIS — M62830 Muscle spasm of back: Secondary | ICD-10-CM | POA: Diagnosis not present

## 2018-09-29 DIAGNOSIS — M6281 Muscle weakness (generalized): Secondary | ICD-10-CM | POA: Diagnosis not present

## 2018-09-29 DIAGNOSIS — M545 Low back pain: Secondary | ICD-10-CM | POA: Insufficient documentation

## 2018-09-29 DIAGNOSIS — R293 Abnormal posture: Secondary | ICD-10-CM | POA: Diagnosis not present

## 2018-09-29 DIAGNOSIS — M5416 Radiculopathy, lumbar region: Secondary | ICD-10-CM | POA: Diagnosis not present

## 2018-09-29 DIAGNOSIS — M5382 Other specified dorsopathies, cervical region: Secondary | ICD-10-CM | POA: Insufficient documentation

## 2018-09-29 NOTE — Therapy (Signed)
[address] [place], [place], [address] Phone: [phone]   Fax:  [phone]  Physical Therapy Evaluation  Patient Details  Name: [name] MRN: [id number] Date of Birth: 09-May-1935 Referring Provider (PT): Delice Lesch   Encounter Date: 09/29/2018  PT End of Session - 09/29/18 0954    Visit Number  1    Date for PT Re-Evaluation  11/29/18    PT Start Time  0930    PT Stop Time  1016    PT Time Calculation (min)  46 min    Activity Tolerance  Patient tolerated treatment well    Behavior During Therapy  [facility] for tasks assessed/performed       Past Medical History:  Diagnosis Date  . Asthma   . Atrophic vaginitis   . Breast cancer, Left 12/20/2010   NO BLOOD PRESSURE CHECKS OR STICKS IN LEFT ARM  . CAD (coronary artery disease)    a. 01/19/2010 s/p CABG x 3, lima->lad, vg->diag, vg->om1;  b. 06/2011 :Lexi MV: EF 84%, No ischemia. c. 01/06/14 s/p negative nuclear stress test with EF >70%  . Cervical arthritis   . Colonic polyp 04-27-2009   tubular adenoma  . Diverticulosis of colon (without mention of hemorrhage)   . Fibromyalgia   . Gallstones   . GERD (gastroesophageal reflux disease)   . Glaucoma   . Headache(784.0)    a. frequently assocaited with high BPs  . Hiatal hernia   . Hypercholesterolemia   . Irritable bowel syndrome   . Labile hypertension   . Lymphedema    left arm  . Pinched vertebral nerve     Past Surgical History:  Procedure Laterality Date  . [place] STUDY N/A 01/08/2016   Procedure: [place] STUDY;  Surgeon: Ladene Artist, MD;  Location: [facility];  Service: Endoscopy;  Laterality: N/A;  . BREAST LUMPECTOMY Left 02/06/11  . CATARACT EXTRACTION, BILATERAL     bilateral caaract removal,  . CORONARY ANGIOPLASTY WITH STENT PLACEMENT     Stent 2007  . CORONARY ARTERY BYPASS GRAFT    . ESOPHAGEAL MANOMETRY N/A 01/08/2016   Procedure: ESOPHAGEAL MANOMETRY (EM);   Surgeon: Ladene Artist, MD;  [facility];  Service: Endoscopy;  Laterality: N/A;  . open heart surgery     01/19/2010    There were no vitals filed for this visit.   Subjective Assessment - 09/29/18 0928    Subjective  Patient was seen here about a year ago, she is having now right low back pain, with some pain in the right buttock and posterior right leg.  Patient reports that she got better with our treatment and was abe to go and do without much issue but denies that it went all the way away    Pertinent History  HTN, CA in past    Limitations  Standing;Sitting;Walking    How long can you sit comfortably?  1 hour    How long can you walk comfortably?  25-30 min    Diagnostic tests  NCV - pinched nerve    Patient Stated Goals  to decrease pain    Currently in Pain?  Yes    Pain Score  5     Pain Location  Back    Pain Orientation  Right;Lower    Pain Descriptors / Indicators  Aching    Pain Type  Acute pain    Pain Radiating Towards  reports pain  in the right buttock the most and then in the right posterior leg area    Pain Onset  More than a month ago    Pain Frequency  Constant    Aggravating Factors   sitting, standing, bending, gardening pain can be a 9/10    Pain Relieving Factors  rest, lie down, Tylenol pain can get down to a 4/10    Effect of Pain on Daily Activities  difficulty with ADL's. standing and bending, gardening         [facility] PT Assessment - 09/29/18 0001      Assessment   Medical Diagnosis  LBP    Referring Provider (PT)  Delice Lesch    Onset Date/Surgical Date  08/29/18    Hand Dominance  Right    Prior Therapy  a year ago      Precautions   Precautions  None      Balance Screen   Has the patient fallen in the past 6 months  Yes    How many times?  1   fell into the door after gardening   Has the patient had a decrease in activity level because of a fear of falling?   No    Is the patient reluctant to leave their home because of a fear  of falling?   No      Home Environment   Additional Comments  does gardening, some steps, does housework      Prior Function   Level of [place]  Retired    Leisure  gardens      Mining engineer Comments  fwd head, rounded shoulders      AROM   Overall AROM Comments  lumbar ROM decreased 25% with most pain with flexion, pain in the right buttock and low back area      Strength   Right Hip Flexion  3+/5    Right Hip Extension  4-/5    Right Hip ABduction  4-/5    Left Hip Flexion  3+/5    Left Hip Extension  4-/5      Flexibility   Soft Tissue Assessment /Muscle Length  yes    Hamstrings  tight    Quadriceps  very tight    ITB  very tight    Piriformis  tight      Palpation   Palpation comment  has a knot in the right SI that is tender and painful, has spasms in the lumbar area, tight and tender into the right ITB      Ambulation/Gait   Gait Comments  has a slight antalgic gait on the right                Objective measurements completed on examination: See above findings.      [facility] Adult PT Treatment/Exercise - 09/29/18 0001      Modalities   Modalities  Electrical Stimulation;Moist Heat      Moist Heat Therapy   Number Minutes Moist Heat  15 Minutes    Moist Heat Location  Lumbar Spine      Electrical Stimulation   Electrical Stimulation Location  right buttock area    Electrical Stimulation Action  IFC    Electrical Stimulation Parameters  supine    Electrical Stimulation Goals  Pain             PT Education - 09/29/18 0953    Education Details  piriformis and ITB  stretch    Person(s) Educated  Patient    Methods  Explanation;Demonstration;Handout    Comprehension  Verbalized understanding       PT Short Term Goals - 09/29/18 0956      PT SHORT TERM GOAL #1   Title  pt will be I with initial HEP      Time  2    Period  Weeks    Status  New        PT Long Term Goals -  09/29/18 0957      PT LONG TERM GOAL #1   Title  Pt I with advanced HEP    Time  8    Period  Weeks    Status  New      PT LONG TERM GOAL #2   Title  Patient to report decreased frequency of RLE pain by 50% or more with ADLS.    Time  8    Period  Weeks    Status  New      PT LONG TERM GOAL #3   Title  increase lumbar ROM 25%    Time  8    Period  Weeks    Status  New      PT LONG TERM GOAL #4   Title  Patient to demo improved strength in RLE strength to 4+/5    Time  8    Period  Weeks    Status  New             Plan - 09/29/18 0954    Clinical Impression Statement  Patient has been seen here before for low back pain, today seems to be a little more right sciatica with pain in the right buttock and the right psoterior thigh, she has some limiteation in lumbar ROM, has some decreased right LE strength.  She is very tight and tender in the right buttock.  Tight HS, piriformis and ITB    Stability/Clinical Decision Making  Stable/Uncomplicated    Clinical Decision Making  Low    Rehab Potential  Good    PT Frequency  2x / week    PT Duration  8 weeks    PT Treatment/Interventions  ADLs/Self Care Home Management;Cryotherapy;Electrical Stimulation;Moist Heat;Traction;Therapeutic exercise;Neuromuscular re-education;Patient/family education;Manual techniques;Dry needling;Taping;Iontophoresis 4mg /ml Dexamethasone;Ultrasound    PT Next Visit Plan  We will try to work on the sciatica type pain and the tightness    Consulted and Agree with Plan of Care  Patient       Patient will benefit from skilled therapeutic intervention in order to improve the following deficits and impairments:  Decreased activity tolerance, Decreased strength, Pain, Decreased range of motion, Impaired flexibility, Abnormal gait, Improper body mechanics, Decreased mobility, Increased muscle spasms, Difficulty walking  Visit Diagnosis: 1. Radiculopathy, lumbar region   2. Acute right-sided low back pain  with right-sided sciatica   3. Muscle weakness (generalized)   4. Muscle spasm of back        Problem List Patient Active Problem List   Diagnosis Date Noted  . Viral sinusitis 09/25/2018  . HTN (hypertension) 04/12/2018  . Lumbar radiculopathy 04/12/2018  . Degeneration of lumbar intervertebral disc 12/16/2017  . Spinal stenosis of lumbar region 12/16/2017  . Osteoarthritis of hip 09/25/2017  . Low back pain 09/25/2017  . S/P CABG (coronary artery bypass graft) 12/19/2016  . 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  . Heartburn 05/23/2015  . OSA on CPAP 11/23/2014  . Generalized anxiety disorder 02/04/2014  . Labile hypertension   .  Fibromyalgia   . Lumbar adjacent segment disease with spondylolisthesis 12/24/2013  . CAD (coronary artery disease) 12/24/2013  . Bronchitis, chronic obstructive w acute bronchitis ([place]) 12/21/2013  . Diverticulosis of colon without hemorrhage 11/15/2013  . Hot flashes related to aromatase inhibitor therapy 07/27/2013  . Osteopenia 07/27/2013  . DJD (degenerative joint disease) 09/29/2012  . Breast cancer, Left 12/20/2010  . FATTY LIVER DISEASE 01/09/2010  . Allergic rhinitis 02/18/2007  . DIZZINESS, CHRONIC 02/18/2007  . Headache in back of head 02/18/2007  . Mixed hyperlipidemia 12/23/2006  . GLAUCOMA 12/23/2006  . Coronary atherosclerosis 12/23/2006  . Asthma 12/23/2006  . Gastroesophageal reflux disease 12/23/2006  . IRRITABLE BOWEL SYNDROME 12/23/2006  . Myalgia and myositis 12/23/2006    Sumner Boast., PT 09/29/2018, 9:59 AM  [address] [place], [place], [address] Phone: [phone]   Fax:  [phone]  Name: [name] MRN: [id number] Date of Birth: 18-Sep-1935

## 2018-10-06 ENCOUNTER — Ambulatory Visit: Payer: Medicare Other | Admitting: Physical Therapy

## 2018-10-06 ENCOUNTER — Other Ambulatory Visit: Payer: Self-pay

## 2018-10-06 DIAGNOSIS — M5416 Radiculopathy, lumbar region: Secondary | ICD-10-CM

## 2018-10-06 DIAGNOSIS — M62830 Muscle spasm of back: Secondary | ICD-10-CM

## 2018-10-06 DIAGNOSIS — M5441 Lumbago with sciatica, right side: Secondary | ICD-10-CM | POA: Diagnosis not present

## 2018-10-06 DIAGNOSIS — M6281 Muscle weakness (generalized): Secondary | ICD-10-CM

## 2018-10-06 DIAGNOSIS — R293 Abnormal posture: Secondary | ICD-10-CM | POA: Diagnosis not present

## 2018-10-06 DIAGNOSIS — M5382 Other specified dorsopathies, cervical region: Secondary | ICD-10-CM | POA: Diagnosis not present

## 2018-10-06 NOTE — Therapy (Signed)
[address] [place], [place], [address] Phone: [phone]   Fax:  [phone]  Physical Therapy Treatment  Patient Details  Name: [name] MRN: [id number] Date of Birth: 12-01-1935 Referring Provider (PT): Delice Lesch   Encounter Date: 10/06/2018  PT End of Session - 10/06/18 1304    Visit Number  2    Date for PT Re-Evaluation  11/29/18    PT Start Time  1230    PT Stop Time  1320    PT Time Calculation (min)  50 min       Past Medical History:  Diagnosis Date  . Asthma   . Atrophic vaginitis   . Breast cancer, Left 12/20/2010   NO BLOOD PRESSURE CHECKS OR STICKS IN LEFT ARM  . CAD (coronary artery disease)    a. 01/19/2010 s/p CABG x 3, lima->lad, vg->diag, vg->om1;  b. 06/2011 :Lexi MV: EF 84%, No ischemia. c. 01/06/14 s/p negative nuclear stress test with EF >70%  . Cervical arthritis   . Colonic polyp 04-27-2009   tubular adenoma  . Diverticulosis of colon (without mention of hemorrhage)   . Fibromyalgia   . Gallstones   . GERD (gastroesophageal reflux disease)   . Glaucoma   . Headache(784.0)    a. frequently assocaited with high BPs  . Hiatal hernia   . Hypercholesterolemia   . Irritable bowel syndrome   . Labile hypertension   . Lymphedema    left arm  . Pinched vertebral nerve     Past Surgical History:  Procedure Laterality Date  . [place] STUDY N/A 01/08/2016   Procedure: [place] STUDY;  Surgeon: Ladene Artist, MD;  [facility];  Service: Endoscopy;  Laterality: N/A;  . BREAST LUMPECTOMY Left 02/06/11  . CATARACT EXTRACTION, BILATERAL     bilateral caaract removal,  . CORONARY ANGIOPLASTY WITH STENT PLACEMENT     Stent 2007  . CORONARY ARTERY BYPASS GRAFT    . ESOPHAGEAL MANOMETRY N/A 01/08/2016   Procedure: ESOPHAGEAL MANOMETRY (EM);  Surgeon: Ladene Artist, MD;  [facility];  Service: Endoscopy;  Laterality: N/A;  . open heart surgery      01/19/2010    There were no vitals filed for this visit.  Subjective Assessment - 10/06/18 1301    Subjective  whole back hurts. in garden this morning and it always make back worse    Currently in Pain?  Yes    Pain Score  5     Pain Location  Back                       [facility] Adult PT Treatment/Exercise - 10/06/18 0001      Modalities   Modalities  Electrical Stimulation;Moist Heat;Ultrasound      Moist Heat Therapy   Number Minutes Moist Heat  15 Minutes    Moist Heat Location  Lumbar Spine      Electrical Stimulation   Electrical Stimulation Location  LB and buttock    Electrical Stimulation Action  IFC    Electrical Stimulation Parameters  supine    Electrical Stimulation Goals  Pain      Ultrasound   Ultrasound Location  RT buttock    Ultrasound Parameters  3 mhz 100% cont    Ultrasound Goals  Pain      Manual Therapy   Manual Therapy  Passive ROM;Soft tissue mobilization    Manual  therapy comments  multi knots in RT buttock    Soft tissue mobilization  RT SI and LB    Passive ROM  quads, hip flexor,IT, HS and trunk             PT Education - 10/06/18 1303    Education Details  using knees when bending in garden vs straight over at hips to decrease LB strain    Person(s) Educated  Patient    Methods  Explanation;Demonstration    Comprehension  Verbalized understanding       PT Short Term Goals - 09/29/18 0956      PT SHORT TERM GOAL #1   Title  pt will be I with initial HEP      Time  2    Period  Weeks    Status  New        PT Long Term Goals - 09/29/18 0957      PT LONG TERM GOAL #1   Title  Pt I with advanced HEP    Time  8    Period  Weeks    Status  New      PT LONG TERM GOAL #2   Title  Patient to report decreased frequency of RLE pain by 50% or more with ADLS.    Time  8    Period  Weeks    Status  New      PT LONG TERM GOAL #3   Title  increase lumbar ROM 25%    Time  8    Period  Weeks    Status  New       PT LONG TERM GOAL #4   Title  Patient to demo improved strength in RLE strength to 4+/5    Time  8    Period  Weeks    Status  New            Plan - 10/06/18 1304    Clinical Impression Statement  very tight LE esp quads and hip flex, tightness with trigger points in RT buttock. educ on better lifting/squating    PT Treatment/Interventions  ADLs/Self Care Home Management;Cryotherapy;Electrical Stimulation;Moist Heat;Traction;Therapeutic exercise;Neuromuscular re-education;Patient/family education;Manual techniques;Dry needling;Taping;Iontophoresis 4mg /ml Dexamethasone;Ultrasound    PT Next Visit Plan  We will try to work on the sciatica type pain and the tightness       Patient will benefit from skilled therapeutic intervention in order to improve the following deficits and impairments:  Decreased activity tolerance, Decreased strength, Pain, Decreased range of motion, Impaired flexibility, Abnormal gait, Improper body mechanics, Decreased mobility, Increased muscle spasms, Difficulty walking  Visit Diagnosis: 1. Acute right-sided low back pain with right-sided sciatica   2. Radiculopathy, lumbar region   3. Muscle weakness (generalized)   4. Muscle spasm of back        Problem List Patient Active Problem List   Diagnosis Date Noted  . Viral sinusitis 09/25/2018  . HTN (hypertension) 04/12/2018  . Lumbar radiculopathy 04/12/2018  . Degeneration of lumbar intervertebral disc 12/16/2017  . Spinal stenosis of lumbar region 12/16/2017  . Osteoarthritis of hip 09/25/2017  . Low back pain 09/25/2017  . S/P CABG (coronary artery bypass graft) 12/19/2016  . 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  . Heartburn 05/23/2015  . OSA on CPAP 11/23/2014  . Generalized anxiety disorder 02/04/2014  . Labile hypertension   . Fibromyalgia   . Lumbar adjacent segment disease with spondylolisthesis 12/24/2013  . CAD (coronary artery disease) 12/24/2013  . Bronchitis, chronic  obstructive  w acute bronchitis ([place]) 12/21/2013  . Diverticulosis of colon without hemorrhage 11/15/2013  . Hot flashes related to aromatase inhibitor therapy 07/27/2013  . Osteopenia 07/27/2013  . DJD (degenerative joint disease) 09/29/2012  . Breast cancer, Left 12/20/2010  . FATTY LIVER DISEASE 01/09/2010  . Allergic rhinitis 02/18/2007  . DIZZINESS, CHRONIC 02/18/2007  . Headache in back of head 02/18/2007  . Mixed hyperlipidemia 12/23/2006  . GLAUCOMA 12/23/2006  . Coronary atherosclerosis 12/23/2006  . Asthma 12/23/2006  . Gastroesophageal reflux disease 12/23/2006  . IRRITABLE BOWEL SYNDROME 12/23/2006  . Myalgia and myositis 12/23/2006    ,ANGIE  PTA 10/06/2018, 1:06 PM  [address] [place], [place], [address] Phone: [phone]   Fax:  [phone]  Name: CORTLYN CANNELL MRN: [id number] Date of Birth: [birth year]

## 2018-10-08 ENCOUNTER — Other Ambulatory Visit: Payer: Self-pay

## 2018-10-08 ENCOUNTER — Ambulatory Visit: Payer: Medicare Other | Admitting: Physical Therapy

## 2018-10-08 DIAGNOSIS — M5441 Lumbago with sciatica, right side: Secondary | ICD-10-CM

## 2018-10-08 DIAGNOSIS — M5382 Other specified dorsopathies, cervical region: Secondary | ICD-10-CM | POA: Diagnosis not present

## 2018-10-08 DIAGNOSIS — M5416 Radiculopathy, lumbar region: Secondary | ICD-10-CM | POA: Diagnosis not present

## 2018-10-08 DIAGNOSIS — M62830 Muscle spasm of back: Secondary | ICD-10-CM

## 2018-10-08 DIAGNOSIS — M6281 Muscle weakness (generalized): Secondary | ICD-10-CM | POA: Diagnosis not present

## 2018-10-08 DIAGNOSIS — R293 Abnormal posture: Secondary | ICD-10-CM | POA: Diagnosis not present

## 2018-10-08 NOTE — Therapy (Signed)
[address] [place], [place], [address] Phone: [phone]   Fax:  [phone]  Physical Therapy Treatment  Patient Details  Name: [name] MRN: [id number] Date of Birth: 1935-12-16 Referring Provider (PT): Delice Lesch   Encounter Date: 10/08/2018  PT End of Session - 10/08/18 1343    Visit Number  3    Date for PT Re-Evaluation  11/29/18    PT Start Time  3825    PT Stop Time  0539    PT Time Calculation (min)  50 min       Past Medical History:  Diagnosis Date  . Asthma   . Atrophic vaginitis   . Breast cancer, Left 12/20/2010   NO BLOOD PRESSURE CHECKS OR STICKS IN LEFT ARM  . CAD (coronary artery disease)    a. 01/19/2010 s/p CABG x 3, lima->lad, vg->diag, vg->om1;  b. 06/2011 :Lexi MV: EF 84%, No ischemia. c. 01/06/14 s/p negative nuclear stress test with EF >70%  . Cervical arthritis   . Colonic polyp 04-27-2009   tubular adenoma  . Diverticulosis of colon (without mention of hemorrhage)   . Fibromyalgia   . Gallstones   . GERD (gastroesophageal reflux disease)   . Glaucoma   . Headache(784.0)    a. frequently assocaited with high BPs  . Hiatal hernia   . Hypercholesterolemia   . Irritable bowel syndrome   . Labile hypertension   . Lymphedema    left arm  . Pinched vertebral nerve     Past Surgical History:  Procedure Laterality Date  . [place] STUDY N/A 01/08/2016   Procedure: [place] STUDY;  Surgeon: Ladene Artist, MD;  [facility];  Service: Endoscopy;  Laterality: N/A;  . BREAST LUMPECTOMY Left 02/06/11  . CATARACT EXTRACTION, BILATERAL     bilateral caaract removal,  . CORONARY ANGIOPLASTY WITH STENT PLACEMENT     Stent 2007  . CORONARY ARTERY BYPASS GRAFT    . ESOPHAGEAL MANOMETRY N/A 01/08/2016   Procedure: ESOPHAGEAL MANOMETRY (EM);  Surgeon: Ladene Artist, MD;  [facility];  Service: Endoscopy;  Laterality: N/A;  . open heart surgery      01/19/2010    There were no vitals filed for this visit.  Subjective Assessment - 10/08/18 1340    Subjective  almost called after last session it hurt so bad and pain ran down RT leg front and back    Currently in Pain?  Yes    Pain Score  4     Pain Location  Back    Pain Orientation  Right;Lower                       [facility] Adult PT Treatment/Exercise - 10/08/18 0001      Modalities   Modalities  Electrical Stimulation;Moist Heat;Ultrasound;Iontophoresis      Moist Heat Therapy   Number Minutes Moist Heat  15 Minutes    Moist Heat Location  Lumbar Spine      Electrical Stimulation   Electrical Stimulation Location  LB and buttock    Electrical Stimulation Action  IFC    Electrical Stimulation Parameters  supine    Electrical Stimulation Goals  Pain      Ultrasound   Ultrasound Location  RT buttock    Ultrasound Parameters  34mhz 100% cont    Ultrasound Goals  Pain      Iontophoresis   Type  of Iontophoresis  Dexamethasone    Location  RT SI    Dose  1.1 cc    Time  2mA, 4 hour patch      Manual Therapy   Manual Therapy  Manual Traction;Passive ROM    Passive ROM  quads, hip flexor,IT, HS and trunk    Manual Traction  sheet traction NE and RT SL distraction- some relief             PT Education - 10/08/18 1343    Education Details  reviewed piriformis and IT stretch with pt    Person(s) Educated  Patient    Methods  Explanation;Demonstration;Verbal cues    Comprehension  Verbalized understanding;Returned demonstration       PT Short Term Goals - 09/29/18 0956      PT SHORT TERM GOAL #1   Title  pt will be I with initial HEP      Time  2    Period  Weeks    Status  New        PT Long Term Goals - 09/29/18 0957      PT LONG TERM GOAL #1   Title  Pt I with advanced HEP    Time  8    Period  Weeks    Status  New      PT LONG TERM GOAL #2   Title  Patient to report decreased frequency of RLE pain by 50% or more with ADLS.     Time  8    Period  Weeks    Status  New      PT LONG TERM GOAL #3   Title  increase lumbar ROM 25%    Time  8    Period  Weeks    Status  New      PT LONG TERM GOAL #4   Title  Patient to demo improved strength in RLE strength to 4+/5    Time  8    Period  Weeks    Status  New            Plan - 10/08/18 1343    Clinical Impression Statement  reviewed HEP at pt request, some cuing needed. increased flexibility noted. NE with sheet tratcion but some relief with SL distratcion.no STw as pt was very sore after last session possibly try DN next session, added ionto to RT SI    PT Treatment/Interventions  ADLs/Self Care Home Management;Cryotherapy;Electrical Stimulation;Moist Heat;Traction;Therapeutic exercise;Neuromuscular re-education;Patient/family education;Manual techniques;Dry needling;Taping;Iontophoresis 4mg /ml Dexamethasone;Ultrasound    PT Next Visit Plan  assess pain, ? DN       Patient will benefit from skilled therapeutic intervention in order to improve the following deficits and impairments:  Decreased activity tolerance, Decreased strength, Pain, Decreased range of motion, Impaired flexibility, Abnormal gait, Improper body mechanics, Decreased mobility, Increased muscle spasms, Difficulty walking  Visit Diagnosis: 1. Acute right-sided low back pain with right-sided sciatica   2. Radiculopathy, lumbar region   3. Muscle weakness (generalized)   4. Muscle spasm of back        Problem List Patient Active Problem List   Diagnosis Date Noted  . Viral sinusitis 09/25/2018  . HTN (hypertension) 04/12/2018  . Lumbar radiculopathy 04/12/2018  . Degeneration of lumbar intervertebral disc 12/16/2017  . Spinal stenosis of lumbar region 12/16/2017  . Osteoarthritis of hip 09/25/2017  . Low back pain 09/25/2017  . S/P CABG (coronary artery bypass graft) 12/19/2016  . 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  . Heartburn  05/23/2015  . OSA on CPAP 11/23/2014  .  Generalized anxiety disorder 02/04/2014  . Labile hypertension   . Fibromyalgia   . Lumbar adjacent segment disease with spondylolisthesis 12/24/2013  . CAD (coronary artery disease) 12/24/2013  . Bronchitis, chronic obstructive w acute bronchitis ([place]) 12/21/2013  . Diverticulosis of colon without hemorrhage 11/15/2013  . Hot flashes related to aromatase inhibitor therapy 07/27/2013  . Osteopenia 07/27/2013  . DJD (degenerative joint disease) 09/29/2012  . Breast cancer, Left 12/20/2010  . FATTY LIVER DISEASE 01/09/2010  . Allergic rhinitis 02/18/2007  . DIZZINESS, CHRONIC 02/18/2007  . Headache in back of head 02/18/2007  . Mixed hyperlipidemia 12/23/2006  . GLAUCOMA 12/23/2006  . Coronary atherosclerosis 12/23/2006  . Asthma 12/23/2006  . Gastroesophageal reflux disease 12/23/2006  . IRRITABLE BOWEL SYNDROME 12/23/2006  . Myalgia and myositis 12/23/2006    Gargi Berch,ANGIE  PTA 10/08/2018, 1:46 PM  [address] [place], [place], [address] Phone: [phone]   Fax:  [phone]  Name: [name] MRN: [id number] Date of Birth: April 24, 1935

## 2018-10-13 ENCOUNTER — Other Ambulatory Visit: Payer: Self-pay

## 2018-10-13 ENCOUNTER — Ambulatory Visit: Payer: Medicare Other | Admitting: Physical Therapy

## 2018-10-13 DIAGNOSIS — M5416 Radiculopathy, lumbar region: Secondary | ICD-10-CM | POA: Diagnosis not present

## 2018-10-13 DIAGNOSIS — M5441 Lumbago with sciatica, right side: Secondary | ICD-10-CM | POA: Diagnosis not present

## 2018-10-13 DIAGNOSIS — M6281 Muscle weakness (generalized): Secondary | ICD-10-CM

## 2018-10-13 DIAGNOSIS — M62830 Muscle spasm of back: Secondary | ICD-10-CM | POA: Diagnosis not present

## 2018-10-13 DIAGNOSIS — M545 Low back pain, unspecified: Secondary | ICD-10-CM

## 2018-10-13 DIAGNOSIS — R293 Abnormal posture: Secondary | ICD-10-CM

## 2018-10-13 DIAGNOSIS — M5382 Other specified dorsopathies, cervical region: Secondary | ICD-10-CM | POA: Diagnosis not present

## 2018-10-13 NOTE — Therapy (Signed)
[address] [place], [place], [address] Phone: [phone]   Fax:  [phone]  Physical Therapy Treatment  Patient Details  Name: [name] MRN: [id number] Date of Birth: 1935/11/12 Referring Provider (PT): Delice Lesch   Encounter Date: 10/13/2018  PT End of Session - 10/13/18 9381    Visit Number  4    Date for PT Re-Evaluation  11/29/18    PT Start Time  1316    PT Stop Time  1419    PT Time Calculation (min)  63 min       Past Medical History:  Diagnosis Date  . Asthma   . Atrophic vaginitis   . Breast cancer, Left 12/20/2010   NO BLOOD PRESSURE CHECKS OR STICKS IN LEFT ARM  . CAD (coronary artery disease)    a. 01/19/2010 s/p CABG x 3, lima->lad, vg->diag, vg->om1;  b. 06/2011 :Lexi MV: EF 84%, No ischemia. c. 01/06/14 s/p negative nuclear stress test with EF >70%  . Cervical arthritis   . Colonic polyp 04-27-2009   tubular adenoma  . Diverticulosis of colon (without mention of hemorrhage)   . Fibromyalgia   . Gallstones   . GERD (gastroesophageal reflux disease)   . Glaucoma   . Headache(784.0)    a. frequently assocaited with high BPs  . Hiatal hernia   . Hypercholesterolemia   . Irritable bowel syndrome   . Labile hypertension   . Lymphedema    left arm  . Pinched vertebral nerve     Past Surgical History:  Procedure Laterality Date  . [place] STUDY N/A 01/08/2016   Procedure: [place] STUDY;  Surgeon: Ladene Artist, MD;  Location: [facility];  Service: Endoscopy;  Laterality: N/A;  . BREAST LUMPECTOMY Left 02/06/11  . CATARACT EXTRACTION, BILATERAL     bilateral caaract removal,  . CORONARY ANGIOPLASTY WITH STENT PLACEMENT     Stent 2007  . CORONARY ARTERY BYPASS GRAFT    . ESOPHAGEAL MANOMETRY N/A 01/08/2016   Procedure: ESOPHAGEAL MANOMETRY (EM);  Surgeon: Ladene Artist, MD;  Location: [facility];  Service: Endoscopy;  Laterality: N/A;  . open heart surgery      01/19/2010    There were no vitals filed for this visit.  Subjective Assessment - 10/13/18 1319    Subjective  worked in the garden and experiences a catch throughout the motion, iontophoresis helped out                       [facility] Adult PT Treatment/Exercise - 10/13/18 0001      Exercises   Exercises  Lumbar   trunk flexion and extension 30 each     Lumbar Exercises: Stretches   Passive Hamstring Stretch  5 reps;10 seconds    Quad Stretch  5 reps;10 seconds;3 reps    Other Lumbar Stretch Exercise  glute stretch 5 reps; 10 sec      Knee/Hip Exercises: Machines for Therapist, art for 5 minutes      Knee/Hip Exercises: Supine   Other Supine Knee/Hip Exercises  DL bridging 2x10      Modalities   Modalities  Electrical Stimulation;Moist Heat;Ultrasound;Iontophoresis      Moist Heat Therapy   Number Minutes Moist Heat  15 Minutes    Moist Heat Location  Lumbar Spine;Hip      Electrical Stimulation   Electrical Stimulation Location  R SI; R  Programmer, applications Parameters  sidelying    Electrical Stimulation Goals  Pain      Iontophoresis   Type of Iontophoresis  Dexamethasone    Location  RT SI    Dose  1.1 cc    Time  7mA, 4 hour patch      Manual Therapy   Soft tissue mobilization  RT SI and RT IT band    Passive ROM  quads, hip flexor,IT, HS and trunk               PT Short Term Goals - 09/29/18 0956      PT SHORT TERM GOAL #1   Title  pt will be I with initial HEP      Time  2    Period  Weeks    Status  New        PT Long Term Goals - 09/29/18 0957      PT LONG TERM GOAL #1   Title  Pt I with advanced HEP    Time  8    Period  Weeks    Status  New      PT LONG TERM GOAL #2   Title  Patient to report decreased frequency of RLE pain by 50% or more with ADLS.    Time  8    Period  Weeks    Status  New      PT LONG TERM GOAL #3   Title  increase  lumbar ROM 25%    Time  8    Period  Weeks    Status  New      PT LONG TERM GOAL #4   Title  Patient to demo improved strength in RLE strength to 4+/5    Time  8    Period  Weeks    Status  New            Plan - 10/13/18 1400    Clinical Impression Statement  patient didnt present with pain at beginning of session, tension in IT band discovered and RT SI still tender- addressed both. VCing needed with ther ex , decreased core activation    PT Treatment/Interventions  ADLs/Self Care Home Management;Cryotherapy;Electrical Stimulation;Moist Heat;Traction;Therapeutic exercise;Neuromuscular re-education;Patient/family education;Manual techniques;Dry needling;Taping;Iontophoresis 4mg /ml Dexamethasone;Ultrasound    PT Next Visit Plan  contine to progress TE ,address IB and SI       Patient will benefit from skilled therapeutic intervention in order to improve the following deficits and impairments:  Decreased activity tolerance, Decreased strength, Pain, Decreased range of motion, Impaired flexibility, Abnormal gait, Improper body mechanics, Decreased mobility, Increased muscle spasms, Difficulty walking  Visit Diagnosis: 1. Acute right-sided low back pain with right-sided sciatica   2. Muscle weakness (generalized)   3. Decreased ROM of intervertebral discs of cervical spine   4. Abnormal posture   5. Acute bilateral low back pain without sciatica        Problem List Patient Active Problem List   Diagnosis Date Noted  . Viral sinusitis 09/25/2018  . HTN (hypertension) 04/12/2018  . Lumbar radiculopathy 04/12/2018  . Degeneration of lumbar intervertebral disc 12/16/2017  . Spinal stenosis of lumbar region 12/16/2017  . Osteoarthritis of hip 09/25/2017  . Low back pain 09/25/2017  . S/P CABG (coronary artery bypass graft) 12/19/2016  . 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  . Heartburn 05/23/2015  . OSA on CPAP 11/23/2014  . Generalized anxiety disorder 02/04/2014  .  Labile hypertension   . Fibromyalgia   . Lumbar adjacent segment disease with spondylolisthesis 12/24/2013  . CAD (coronary artery disease) 12/24/2013  . Bronchitis, chronic obstructive w acute bronchitis ([place]) 12/21/2013  . Diverticulosis of colon without hemorrhage 11/15/2013  . Hot flashes related to aromatase inhibitor therapy 07/27/2013  . Osteopenia 07/27/2013  . DJD (degenerative joint disease) 09/29/2012  . Breast cancer, Left 12/20/2010  . FATTY LIVER DISEASE 01/09/2010  . Allergic rhinitis 02/18/2007  . DIZZINESS, CHRONIC 02/18/2007  . Headache in back of head 02/18/2007  . Mixed hyperlipidemia 12/23/2006  . GLAUCOMA 12/23/2006  . Coronary atherosclerosis 12/23/2006  . Asthma 12/23/2006  . Gastroesophageal reflux disease 12/23/2006  . IRRITABLE BOWEL SYNDROME 12/23/2006  . Myalgia and myositis 12/23/2006    PAYSEUR,ANGIE PTA 10/13/2018, 2:08 PM  [address] [place], [place], [address] Phone: [phone]   Fax:  [phone]  Name: [name] MRN: [id number] Date of Birth: 11/25/1935

## 2018-10-15 ENCOUNTER — Encounter: Payer: Self-pay | Admitting: Physical Therapy

## 2018-10-15 ENCOUNTER — Other Ambulatory Visit: Payer: Self-pay

## 2018-10-15 ENCOUNTER — Ambulatory Visit: Payer: Medicare Other | Admitting: Physical Therapy

## 2018-10-15 DIAGNOSIS — M5416 Radiculopathy, lumbar region: Secondary | ICD-10-CM | POA: Diagnosis not present

## 2018-10-15 DIAGNOSIS — M5382 Other specified dorsopathies, cervical region: Secondary | ICD-10-CM | POA: Diagnosis not present

## 2018-10-15 DIAGNOSIS — M6281 Muscle weakness (generalized): Secondary | ICD-10-CM | POA: Diagnosis not present

## 2018-10-15 DIAGNOSIS — M62830 Muscle spasm of back: Secondary | ICD-10-CM | POA: Diagnosis not present

## 2018-10-15 DIAGNOSIS — M5441 Lumbago with sciatica, right side: Secondary | ICD-10-CM

## 2018-10-15 DIAGNOSIS — R293 Abnormal posture: Secondary | ICD-10-CM | POA: Diagnosis not present

## 2018-10-15 NOTE — Therapy (Signed)
[address] [place], [place], [address] Phone: [phone]   Fax:  [phone]  Physical Therapy Treatment  Patient Details  Name: [name] MRN: [id number] Date of Birth: Dec 12, 1935 Referring Provider (PT): Delice Lesch   Encounter Date: 10/15/2018  PT End of Session - 10/15/18 1224    Visit Number  5    Date for PT Re-Evaluation  11/29/18    PT Start Time  0100    PT Stop Time  7121    PT Time Calculation (min)  50 min       Past Medical History:  Diagnosis Date  . Asthma   . Atrophic vaginitis   . Breast cancer, Left 12/20/2010   NO BLOOD PRESSURE CHECKS OR STICKS IN LEFT ARM  . CAD (coronary artery disease)    a. 01/19/2010 s/p CABG x 3, lima->lad, vg->diag, vg->om1;  b. 06/2011 :Lexi MV: EF 84%, No ischemia. c. 01/06/14 s/p negative nuclear stress test with EF >70%  . Cervical arthritis   . Colonic polyp 04-27-2009   tubular adenoma  . Diverticulosis of colon (without mention of hemorrhage)   . Fibromyalgia   . Gallstones   . GERD (gastroesophageal reflux disease)   . Glaucoma   . Headache(784.0)    a. frequently assocaited with high BPs  . Hiatal hernia   . Hypercholesterolemia   . Irritable bowel syndrome   . Labile hypertension   . Lymphedema    left arm  . Pinched vertebral nerve     Past Surgical History:  Procedure Laterality Date  . [place] STUDY N/A 01/08/2016   Procedure: [place] STUDY;  Surgeon: Ladene Artist, MD;  Location: [facility];  Service: Endoscopy;  Laterality: N/A;  . BREAST LUMPECTOMY Left 02/06/11  . CATARACT EXTRACTION, BILATERAL     bilateral caaract removal,  . CORONARY ANGIOPLASTY WITH STENT PLACEMENT     Stent 2007  . CORONARY ARTERY BYPASS GRAFT    . ESOPHAGEAL MANOMETRY N/A 01/08/2016   Procedure: ESOPHAGEAL MANOMETRY (EM);  Surgeon: Ladene Artist, MD;  [facility]: [facility];  Service: Endoscopy;  Laterality: N/A;  . open heart surgery      01/19/2010    There were no vitals filed for this visit.  Subjective Assessment - 10/15/18 1147    Subjective  STw didnt hurt but no difference from last session    Currently in Pain?  Yes    Pain Score  4                        [facility] Adult PT Treatment/Exercise - 10/15/18 0001      Lumbar Exercises: Supine   Ab Set  10 reps   with red physio ball   Straight Leg Raise  10 reps   with abduction, BLE R Tband   Straight Leg Raises Limitations  SLR red tband 10 each    Other Supine Lumbar Exercises  Bent knee hip abduction; flexion; R Tband, 10 reps    Other Supine Lumbar Exercises  Bridging,K2C, obl 10 each      Knee/Hip Exercises: Supine   Other Supine Knee/Hip Exercises  DL bridging 1x10      Moist Heat Therapy   Number Minutes Moist Heat  15 Minutes    Moist Heat Location  Lumbar Spine;Hip      Electrical Stimulation   Electrical Stimulation Location  R SI; R IT  Chartered certified accountant Goals  Pain      Manual Therapy   Manual Therapy  Soft tissue mobilization;Muscle Energy Technique;Passive ROM    Soft tissue mobilization  RT SI, ITB    Passive ROM  Quads, hip flexors, IT, piriformis               PT Short Term Goals - 09/29/18 9323      PT SHORT TERM GOAL #1   Title  pt will be I with initial HEP      Time  2    Period  Weeks    Status  New        PT Long Term Goals - 10/15/18 1218      PT LONG TERM GOAL #1   Title  Pt I with advanced HEP    Status  Partially Met      PT LONG TERM GOAL #2   Title  Patient to report decreased frequency of RLE pain by 50% or more with ADLS.    Status  Partially Met      PT LONG TERM GOAL #3   Title  increase lumbar ROM 25%    Status  Partially Met      PT LONG TERM GOAL #4   Title  Patient to demo improved strength in RLE strength to 4+/5    Status  Partially Met            Plan - 10/15/18 1224     Clinical Impression Statement  pt arrived with c/o pain unchanged, tx helps fo ra little while. After STW and stretching decreased pain and improved gait. Cuing needed with stab ex for core engagement. slow progress towards goals.    PT Treatment/Interventions  ADLs/Self Care Home Management;Cryotherapy;Electrical Stimulation;Moist Heat;Traction;Therapeutic exercise;Neuromuscular re-education;Patient/family education;Manual techniques;Dry needling;Taping;Iontophoresis '4mg'$ /ml Dexamethasone;Ultrasound    PT Next Visit Plan  contine to progress TE ,address IB and SI stab       Patient will benefit from skilled therapeutic intervention in order to improve the following deficits and impairments:  Decreased activity tolerance, Decreased strength, Pain, Decreased range of motion, Impaired flexibility, Abnormal gait, Improper body mechanics, Decreased mobility, Increased muscle spasms, Difficulty walking  Visit Diagnosis: Acute right-sided low back pain with right-sided sciatica  Muscle weakness (generalized)  Decreased ROM of intervertebral discs of cervical spine     Problem List Patient Active Problem List   Diagnosis Date Noted  . Viral sinusitis 09/25/2018  . HTN (hypertension) 04/12/2018  . Lumbar radiculopathy 04/12/2018  . Degeneration of lumbar intervertebral disc 12/16/2017  . Spinal stenosis of lumbar region 12/16/2017  . Osteoarthritis of hip 09/25/2017  . Low back pain 09/25/2017  . S/P CABG (coronary artery bypass graft) 12/19/2016  . 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  . Heartburn 05/23/2015  . OSA on CPAP 11/23/2014  . Generalized anxiety disorder 02/04/2014  . Labile hypertension   . Fibromyalgia   . Lumbar adjacent segment disease with spondylolisthesis 12/24/2013  . CAD (coronary artery disease) 12/24/2013  . Bronchitis, chronic obstructive w acute bronchitis ([place]) 12/21/2013  . Diverticulosis of colon without hemorrhage 11/15/2013  . Hot flashes related to  aromatase inhibitor therapy 07/27/2013  . Osteopenia 07/27/2013  . DJD (degenerative joint disease) 09/29/2012  . Breast cancer, Left 12/20/2010  . FATTY LIVER DISEASE 01/09/2010  . Allergic rhinitis 02/18/2007  . DIZZINESS, CHRONIC 02/18/2007  . Headache in back of head 02/18/2007  .  Mixed hyperlipidemia 12/23/2006  . GLAUCOMA 12/23/2006  . Coronary atherosclerosis 12/23/2006  . Asthma 12/23/2006  . Gastroesophageal reflux disease 12/23/2006  . IRRITABLE BOWEL SYNDROME 12/23/2006  . Myalgia and myositis 12/23/2006    PAYSEUR,ANGIE PTA 10/15/2018, 12:26 PM  [address] [place], [place], [address] Phone: [phone]   Fax:  [phone]  Name: DENESHIA ZUCKER MRN: [id number] Date of Birth: [birth year]

## 2018-10-21 ENCOUNTER — Other Ambulatory Visit: Payer: Self-pay

## 2018-10-21 ENCOUNTER — Ambulatory Visit: Payer: Medicare Other | Admitting: Physical Therapy

## 2018-10-21 ENCOUNTER — Encounter: Payer: Self-pay | Admitting: Physical Therapy

## 2018-10-21 DIAGNOSIS — M6281 Muscle weakness (generalized): Secondary | ICD-10-CM

## 2018-10-21 DIAGNOSIS — M5416 Radiculopathy, lumbar region: Secondary | ICD-10-CM | POA: Diagnosis not present

## 2018-10-21 DIAGNOSIS — M62830 Muscle spasm of back: Secondary | ICD-10-CM | POA: Diagnosis not present

## 2018-10-21 DIAGNOSIS — M5441 Lumbago with sciatica, right side: Secondary | ICD-10-CM | POA: Diagnosis not present

## 2018-10-21 DIAGNOSIS — R293 Abnormal posture: Secondary | ICD-10-CM | POA: Diagnosis not present

## 2018-10-21 DIAGNOSIS — M5382 Other specified dorsopathies, cervical region: Secondary | ICD-10-CM | POA: Diagnosis not present

## 2018-10-21 NOTE — Therapy (Signed)
[address] [place], [place], [address] Phone: [phone]   Fax:  [phone]  Physical Therapy Treatment  Patient Details  Name: [name] MRN: [id number] Date of Birth: Jul 25, 1935 Referring Provider (PT): Delice Lesch   Encounter Date: 10/21/2018  PT End of Session - 10/21/18 1345    Visit Number  6    Date for PT Re-Evaluation  11/29/18    PT Start Time  1694    PT Stop Time  1343    PT Time Calculation (min)  45 min    Activity Tolerance  Patient tolerated treatment well    Behavior During Therapy  [facility] for tasks assessed/performed       Past Medical History:  Diagnosis Date  . Asthma   . Atrophic vaginitis   . Breast cancer, Left 12/20/2010   NO BLOOD PRESSURE CHECKS OR STICKS IN LEFT ARM  . CAD (coronary artery disease)    a. 01/19/2010 s/p CABG x 3, lima->lad, vg->diag, vg->om1;  b. 06/2011 :Lexi MV: EF 84%, No ischemia. c. 01/06/14 s/p negative nuclear stress test with EF >70%  . Cervical arthritis   . Colonic polyp 04-27-2009   tubular adenoma  . Diverticulosis of colon (without mention of hemorrhage)   . Fibromyalgia   . Gallstones   . GERD (gastroesophageal reflux disease)   . Glaucoma   . Headache(784.0)    a. frequently assocaited with high BPs  . Hiatal hernia   . Hypercholesterolemia   . Irritable bowel syndrome   . Labile hypertension   . Lymphedema    left arm  . Pinched vertebral nerve     Past Surgical History:  Procedure Laterality Date  . [place] STUDY N/A 01/08/2016   Procedure: [place] STUDY;  Surgeon: Ladene Artist, MD;  Location: [facility];  Service: Endoscopy;  Laterality: N/A;  . BREAST LUMPECTOMY Left 02/06/11  . CATARACT EXTRACTION, BILATERAL     bilateral caaract removal,  . CORONARY ANGIOPLASTY WITH STENT PLACEMENT     Stent 2007  . CORONARY ARTERY BYPASS GRAFT    . ESOPHAGEAL MANOMETRY N/A 01/08/2016   Procedure: ESOPHAGEAL MANOMETRY (EM);   Surgeon: Ladene Artist, MD;  Location: [facility];  Service: Endoscopy;  Laterality: N/A;  . open heart surgery     01/19/2010    There were no vitals filed for this visit.  Subjective Assessment - 10/21/18 1258    Subjective  no pain, felt a catch today while gardening. numbness while in the car driving [place] in [place] to knee    Currently in Pain?  No/denies    Pain Score  0-No pain                       [facility] Adult PT Treatment/Exercise - 10/21/18 0001      Lumbar Exercises: Aerobic   Nustep  L 4 55mn      Lumbar Exercises: Machines for Strengthening   Cybex Knee Extension   1x10    Cybex Knee Flexion  15# 1x10      Lumbar Exercises: Supine   Straight Leg Raise  20 reps   2 sets   Straight Leg Raises Limitations  2#    Other Supine Lumbar Exercises  Bridging,K2C, obl 2x10   Red physio ball     Knee/Hip Exercises: Standing   Hip Abduction  Stengthening;Right;2 sets;10 reps;Knee straight   2#  Hip Extension  Stengthening;Right;2 sets;10 reps;Knee straight   2#   Other Standing Knee Exercises  steps ups, fwd, side-stepping 2x10 LOB during side step     Knee/Hip Exercises: Seated   Long Arc Quad  Strengthening;Right;1 set;15 reps;Weights    Long Arc Quad Weight  2 lbs.      Manual Therapy   Manual Therapy  Soft tissue mobilization;Muscle Energy Technique;Passive ROM    Soft tissue mobilization  RT SI, ITB    Passive ROM  Quads, IT, piriformis               PT Short Term Goals - 09/29/18 0737      PT SHORT TERM GOAL #1   Title  pt will be I with initial HEP      Time  2    Period  Weeks    Status  New        PT Long Term Goals - 10/15/18 1218      PT LONG TERM GOAL #1   Title  Pt I with advanced HEP    Status  Partially Met      PT LONG TERM GOAL #2   Title  Patient to report decreased frequency of RLE pain by 50% or more with ADLS.    Status  Partially Met      PT LONG TERM GOAL #3   Title  increase lumbar ROM 25%     Status  Partially Met      PT LONG TERM GOAL #4   Title  Patient to demo improved strength in RLE strength to 4+/5    Status  Partially Met            Plan - 10/21/18 1345    Clinical Impression Statement  Pt arrived with no pain, tenderness in ITB, and piriformis. patient had LOB during lateral step up, patient will continue to benefit from PT to address muscle strength, balance, and muscle tension.    PT Treatment/Interventions  ADLs/Self Care Home Management;Cryotherapy;Electrical Stimulation;Moist Heat;Traction;Therapeutic exercise;Neuromuscular re-education;Patient/family education;Manual techniques;Dry needling;Taping;Iontophoresis '4mg'$ /ml Dexamethasone;Ultrasound    PT Next Visit Plan  contine to progress TE ,address ITB and SI stab       Patient will benefit from skilled therapeutic intervention in order to improve the following deficits and impairments:  Decreased activity tolerance, Decreased strength, Pain, Decreased range of motion, Impaired flexibility, Abnormal gait, Improper body mechanics, Decreased mobility, Increased muscle spasms, Difficulty walking  Visit Diagnosis: Muscle weakness (generalized)  Acute right-sided low back pain with right-sided sciatica     Problem List Patient Active Problem List   Diagnosis Date Noted  . Viral sinusitis 09/25/2018  . HTN (hypertension) 04/12/2018  . Lumbar radiculopathy 04/12/2018  . Degeneration of lumbar intervertebral disc 12/16/2017  . Spinal stenosis of lumbar region 12/16/2017  . Osteoarthritis of hip 09/25/2017  . Low back pain 09/25/2017  . S/P CABG (coronary artery bypass graft) 12/19/2016  . 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  . Heartburn 05/23/2015  . OSA on CPAP 11/23/2014  . Generalized anxiety disorder 02/04/2014  . Labile hypertension   . Fibromyalgia   . Lumbar adjacent segment disease with spondylolisthesis 12/24/2013  . CAD (coronary artery disease) 12/24/2013  . Bronchitis, chronic  obstructive w acute bronchitis ([place]) 12/21/2013  . Diverticulosis of colon without hemorrhage 11/15/2013  . Hot flashes related to aromatase inhibitor therapy 07/27/2013  . Osteopenia 07/27/2013  . DJD (degenerative joint disease) 09/29/2012  . Breast cancer, Left 12/20/2010  . FATTY LIVER DISEASE 01/09/2010  .  Allergic rhinitis 02/18/2007  . DIZZINESS, CHRONIC 02/18/2007  . Headache in back of head 02/18/2007  . Mixed hyperlipidemia 12/23/2006  . GLAUCOMA 12/23/2006  . Coronary atherosclerosis 12/23/2006  . Asthma 12/23/2006  . Gastroesophageal reflux disease 12/23/2006  . IRRITABLE BOWEL SYNDROME 12/23/2006  . Myalgia and myositis 12/23/2006    Darcella Cheshire, SPTA 10/21/2018, 1:52 PM  [place] [address] [place], [place], [address] Phone: [phone]   Fax:  [phone]  Name: [name] MRN: [id number] Date of Birth: Aug 10, 1935

## 2018-10-27 ENCOUNTER — Other Ambulatory Visit: Payer: Self-pay

## 2018-10-27 ENCOUNTER — Other Ambulatory Visit: Payer: Self-pay | Admitting: Gastroenterology

## 2018-10-27 ENCOUNTER — Ambulatory Visit: Payer: Medicare Other | Attending: Neurology | Admitting: Physical Therapy

## 2018-10-27 ENCOUNTER — Encounter: Payer: Self-pay | Admitting: Physical Therapy

## 2018-10-27 DIAGNOSIS — M5441 Lumbago with sciatica, right side: Secondary | ICD-10-CM | POA: Insufficient documentation

## 2018-10-27 DIAGNOSIS — R293 Abnormal posture: Secondary | ICD-10-CM | POA: Diagnosis not present

## 2018-10-27 DIAGNOSIS — M6281 Muscle weakness (generalized): Secondary | ICD-10-CM | POA: Insufficient documentation

## 2018-10-27 NOTE — Therapy (Signed)
[address] [place], [place], [address] Phone: [phone]   Fax:  [phone]  Physical Therapy Treatment  Patient Details  Name: [name] MRN: [id number] Date of Birth: 03/05/1935 Referring Provider (PT): Delice Lesch   Encounter Date: 10/27/2018  PT End of Session - 10/27/18 1335    Visit Number  6    Date for PT Re-Evaluation  11/29/18    PT Start Time  2993    PT Stop Time  1315    PT Time Calculation (min)  42 min       Past Medical History:  Diagnosis Date  . Asthma   . Atrophic vaginitis   . Breast cancer, Left 12/20/2010   NO BLOOD PRESSURE CHECKS OR STICKS IN LEFT ARM  . CAD (coronary artery disease)    a. 01/19/2010 s/p CABG x 3, lima->lad, vg->diag, vg->om1;  b. 06/2011 :Lexi MV: EF 84%, No ischemia. c. 01/06/14 s/p negative nuclear stress test with EF >70%  . Cervical arthritis   . Colonic polyp 04-27-2009   tubular adenoma  . Diverticulosis of colon (without mention of hemorrhage)   . Fibromyalgia   . Gallstones   . GERD (gastroesophageal reflux disease)   . Glaucoma   . Headache(784.0)    a. frequently assocaited with high BPs  . Hiatal hernia   . Hypercholesterolemia   . Irritable bowel syndrome   . Labile hypertension   . Lymphedema    left arm  . Pinched vertebral nerve     Past Surgical History:  Procedure Laterality Date  . [place] STUDY N/A 01/08/2016   Procedure: [place] STUDY;  Surgeon: Ladene Artist, MD;  Location: [facility];  Service: Endoscopy;  Laterality: N/A;  . BREAST LUMPECTOMY Left 02/06/11  . CATARACT EXTRACTION, BILATERAL     bilateral caaract removal,  . CORONARY ANGIOPLASTY WITH STENT PLACEMENT     Stent 2007  . CORONARY ARTERY BYPASS GRAFT    . ESOPHAGEAL MANOMETRY N/A 01/08/2016   Procedure: ESOPHAGEAL MANOMETRY (EM);  Surgeon: Ladene Artist, MD;  Location: [facility];  Service: Endoscopy;  Laterality: N/A;  . open heart surgery      01/19/2010    There were no vitals filed for this visit.  Subjective Assessment - 10/27/18 1234    Subjective  achy but no pain probably weather and arthritis. felt a catch on L side, while walking up a hill. numbness comes and goes but not constant    Currently in Pain?  No/denies    Pain Score  0-No pain                       [facility] Adult PT Treatment/Exercise - 10/27/18 0001      Lumbar Exercises: Aerobic   Nustep  L 5 7mn      Lumbar Exercises: Supine   Straight Leg Raise  20 reps   2 sets   Straight Leg Raises Limitations  2#    Other Supine Lumbar Exercises  Bridges, KTC 2x10   Red physio ball     Knee/Hip Exercises: Standing   Hip Abduction  Stengthening;Right;2 sets;10 reps;Knee straight   2#   Hip Extension  Stengthening;Right;2 sets;10 reps;Knee straight   2#   Other Standing Knee Exercises  steps ups, fwd, side-stepping 2x10. standing hip flexion 2# 2x10      Knee/Hip Exercises: Seated   Long Arc Quad  Strengthening;Right;15 reps;Weights;2  sets    [employer] Weight  2 lbs.      Manual Therapy   Manual Therapy  Soft tissue mobilization;Passive ROM    Soft tissue mobilization  RT SI, ITB   tender spot around ischium   Passive ROM  Quads, IT, piriformis               PT Short Term Goals - 09/29/18 0956      PT SHORT TERM GOAL #1   Title  pt will be I with initial HEP      Time  2    Period  Weeks    Status  New        PT Long Term Goals - 10/15/18 1218      PT LONG TERM GOAL #1   Title  Pt I with advanced HEP    Status  Partially Met      PT LONG TERM GOAL #2   Title  Patient to report decreased frequency of RLE pain by 50% or more with ADLS.    Status  Partially Met      PT LONG TERM GOAL #3   Title  increase lumbar ROM 25%    Status  Partially Met      PT LONG TERM GOAL #4   Title  Patient to demo improved strength in RLE strength to 4+/5    Status  Partially Met            Plan - 10/27/18 1336     Clinical Impression Statement  pt was 3 minutes late, no reports of pain but tenderness when picking something up and tenderness in R hip, ITB, ischium area. exercises completed with no onset of pain, minimal VC's to slow down exercises    PT Treatment/Interventions  ADLs/Self Care Home Management;Cryotherapy;Electrical Stimulation;Moist Heat;Traction;Therapeutic exercise;Neuromuscular re-education;Patient/family education;Manual techniques;Dry needling;Taping;Iontophoresis '4mg'$ /ml Dexamethasone;Ultrasound    PT Next Visit Plan  contine to progress TE ,address ITB and SI stab       Patient will benefit from skilled therapeutic intervention in order to improve the following deficits and impairments:  Decreased activity tolerance, Decreased strength, Pain, Decreased range of motion, Impaired flexibility, Abnormal gait, Improper body mechanics, Decreased mobility, Increased muscle spasms, Difficulty walking  Visit Diagnosis: Muscle weakness (generalized)  Abnormal posture  Acute right-sided low back pain with right-sided sciatica     Problem List Patient Active Problem List   Diagnosis Date Noted  . Viral sinusitis 09/25/2018  . HTN (hypertension) 04/12/2018  . Lumbar radiculopathy 04/12/2018  . Degeneration of lumbar intervertebral disc 12/16/2017  . Spinal stenosis of lumbar region 12/16/2017  . Osteoarthritis of hip 09/25/2017  . Low back pain 09/25/2017  . S/P CABG (coronary artery bypass graft) 12/19/2016  . 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  . Heartburn 05/23/2015  . OSA on CPAP 11/23/2014  . Generalized anxiety disorder 02/04/2014  . Labile hypertension   . Fibromyalgia   . Lumbar adjacent segment disease with spondylolisthesis 12/24/2013  . CAD (coronary artery disease) 12/24/2013  . Bronchitis, chronic obstructive w acute bronchitis ([place]) 12/21/2013  . Diverticulosis of colon without hemorrhage 11/15/2013  . Hot flashes related to aromatase inhibitor therapy 07/27/2013   . Osteopenia 07/27/2013  . DJD (degenerative joint disease) 09/29/2012  . Breast cancer, Left 12/20/2010  . FATTY LIVER DISEASE 01/09/2010  . Allergic rhinitis 02/18/2007  . DIZZINESS, CHRONIC 02/18/2007  . Headache in back of head 02/18/2007  . Mixed hyperlipidemia 12/23/2006  . GLAUCOMA 12/23/2006  . Coronary atherosclerosis  12/23/2006  . Asthma 12/23/2006  . Gastroesophageal reflux disease 12/23/2006  . IRRITABLE BOWEL SYNDROME 12/23/2006  . Myalgia and myositis 12/23/2006    Dylan Jashawna Reever, SPTA 10/27/2018, 2:09 PM  [address] [place], [place], [address] Phone: [phone]   Fax:  [phone]  Name: VANDORA JASKULSKI MRN: [id number] Date of Birth: 1935/12/05

## 2018-10-28 MED FILL — ROSUVASTATIN CALCIUM 5 MG T: 5 | 70 days supply | Qty: 30 | Fill #2

## 2018-10-29 ENCOUNTER — Ambulatory Visit: Payer: Medicare Other | Admitting: Physical Therapy

## 2018-10-29 ENCOUNTER — Encounter: Payer: Self-pay | Admitting: Physical Therapy

## 2018-10-29 ENCOUNTER — Other Ambulatory Visit: Payer: Self-pay

## 2018-10-29 DIAGNOSIS — M5441 Lumbago with sciatica, right side: Secondary | ICD-10-CM | POA: Diagnosis not present

## 2018-10-29 DIAGNOSIS — R293 Abnormal posture: Secondary | ICD-10-CM | POA: Diagnosis not present

## 2018-10-29 DIAGNOSIS — M6281 Muscle weakness (generalized): Secondary | ICD-10-CM

## 2018-10-29 NOTE — Therapy (Addendum)
During this treatment session, the therapist was present, participating in and directing the treatment. [address] [place], [place], [address] Phone: [phone]   Fax:  [phone]  Physical Therapy Treatment  Patient Details  Name: NONI STONESIFER MRN: [id number] Date of Birth: 16-Jul-1935 Referring Provider (PT): Delice Lesch   Encounter Date: 10/29/2018  PT End of Session - 10/29/18 1535    Visit Number  7    Date for PT Re-Evaluation  11/29/18    PT Start Time  7510    PT Stop Time  1537    PT Time Calculation (min)  52 min    Activity Tolerance  Patient tolerated treatment well    Behavior During Therapy  [facility] for tasks assessed/performed       Past Medical History:  Diagnosis Date  . Asthma   . Atrophic vaginitis   . Breast cancer, Left 12/20/2010   NO BLOOD PRESSURE CHECKS OR STICKS IN LEFT ARM  . CAD (coronary artery disease)    a. 01/19/2010 s/p CABG x 3, lima->lad, vg->diag, vg->om1;  b. 06/2011 :Lexi MV: EF 84%, No ischemia. c. 01/06/14 s/p negative nuclear stress test with EF >70%  . Cervical arthritis   . Colonic polyp 04-27-2009   tubular adenoma  . Diverticulosis of colon (without mention of hemorrhage)   . Fibromyalgia   . Gallstones   . GERD (gastroesophageal reflux disease)   . Glaucoma   . Headache(784.0)    a. frequently assocaited with high BPs  . Hiatal hernia   . Hypercholesterolemia   . Irritable bowel syndrome   . Labile hypertension   . Lymphedema    left arm  . Pinched vertebral nerve     Past Surgical History:  Procedure Laterality Date  . [place] STUDY N/A 01/08/2016   Procedure: [place] STUDY;  Surgeon: Ladene Artist, MD;  Location: [facility];  Service: Endoscopy;  Laterality: N/A;  . BREAST LUMPECTOMY Left 02/06/11  . CATARACT EXTRACTION, BILATERAL     bilateral caaract removal,  . CORONARY ANGIOPLASTY WITH STENT PLACEMENT     Stent 2007  .  CORONARY ARTERY BYPASS GRAFT    . ESOPHAGEAL MANOMETRY N/A 01/08/2016   Procedure: ESOPHAGEAL MANOMETRY (EM);  Surgeon: Ladene Artist, MD;  Location: [facility];  Service: Endoscopy;  Laterality: N/A;  . open heart surgery     01/19/2010    There were no vitals filed for this visit.  Subjective Assessment - 10/29/18 1440    Subjective  no pain reported, no catch on L leg and no stiffness at the moment, the weather is warm and sunny    Currently in Pain?  No/denies    Pain Score  0-No pain                       [facility] Adult PT Treatment/Exercise - 10/29/18 0001      Lumbar Exercises: Aerobic   Nustep  L4 6 min      Lumbar Exercises: Supine   Other Supine Lumbar Exercises  Bridges, KTC, obliques 2x10      Moist Heat Therapy   Number Minutes Moist Heat  15 Minutes    Moist Heat Location  Lumbar Spine;Hip      Manual Therapy   Manual Therapy  Soft tissue mobilization;Passive ROM    Soft tissue mobilization  RT SI, ITB    Passive ROM  Quads, IT,  piriformis        DN with pt consent to R gluteals with localized twitch response.       PT Short Term Goals - 09/29/18 0956      PT SHORT TERM GOAL #1   Title  pt will be I with initial HEP      Time  2    Period  Weeks    Status  New        PT Long Term Goals - 10/15/18 1218      PT LONG TERM GOAL #1   Title  Pt I with advanced HEP    Status  Partially Met      PT LONG TERM GOAL #2   Title  Patient to report decreased frequency of RLE pain by 50% or more with ADLS.    Status  Partially Met      PT LONG TERM GOAL #3   Title  increase lumbar ROM 25%    Status  Partially Met      PT LONG TERM GOAL #4   Title  Patient to demo improved strength in RLE strength to 4+/5    Status  Partially Met            Plan - 10/29/18 1535    Clinical Impression Statement  pt. had no reports of pain but tenderness present in R hip in SI and ITB. Pt. will continue from PT to better manage this stiffness  and inconsistent pain present in the R hip    PT Treatment/Interventions  ADLs/Self Care Home Management;Cryotherapy;Electrical Stimulation;Moist Heat;Traction;Therapeutic exercise;Neuromuscular re-education;Patient/family education;Manual techniques;Dry needling;Taping;Iontophoresis 68m/ml Dexamethasone;Ultrasound    PT Next Visit Plan  contine to progress TE ,address ITB and SI stab; check to see how dry needling affected area       Patient will benefit from skilled therapeutic intervention in order to improve the following deficits and impairments:  Decreased activity tolerance, Decreased strength, Pain, Decreased range of motion, Impaired flexibility, Abnormal gait, Improper body mechanics, Decreased mobility, Increased muscle spasms, Difficulty walking  Visit Diagnosis: Muscle weakness (generalized)  Abnormal posture  Acute right-sided low back pain with right-sided sciatica     Problem List Patient Active Problem List   Diagnosis Date Noted  . Viral sinusitis 09/25/2018  . HTN (hypertension) 04/12/2018  . Lumbar radiculopathy 04/12/2018  . Degeneration of lumbar intervertebral disc 12/16/2017  . Spinal stenosis of lumbar region 12/16/2017  . Osteoarthritis of hip 09/25/2017  . Low back pain 09/25/2017  . S/P CABG (coronary artery bypass graft) 12/19/2016  . 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  . Heartburn 05/23/2015  . OSA on CPAP 11/23/2014  . Generalized anxiety disorder 02/04/2014  . Labile hypertension   . Fibromyalgia   . Lumbar adjacent segment disease with spondylolisthesis 12/24/2013  . CAD (coronary artery disease) 12/24/2013  . Bronchitis, chronic obstructive w acute bronchitis ([place] 12/21/2013  . Diverticulosis of colon without hemorrhage 11/15/2013  . Hot flashes related to aromatase inhibitor therapy 07/27/2013  . Osteopenia 07/27/2013  . DJD (degenerative joint disease) 09/29/2012  . Breast cancer, Left 12/20/2010  . FATTY LIVER DISEASE 01/09/2010  .  Allergic rhinitis 02/18/2007  . DIZZINESS, CHRONIC 02/18/2007  . Headache in back of head 02/18/2007  . Mixed hyperlipidemia 12/23/2006  . GLAUCOMA 12/23/2006  . Coronary atherosclerosis 12/23/2006  . Asthma 12/23/2006  . Gastroesophageal reflux disease 12/23/2006  . IRRITABLE BOWEL SYNDROME 12/23/2006  . Myalgia and myositis 12/23/2006    Dylan Taliana Mersereau, SPTA 10/29/2018, 4:07 PM  [facility]  [address] [place], [place], [address] Phone: [phone]   Fax:  [phone]  Name: MATSUE STROM MRN: [id number] Date of Birth: 04-Apr-1935  Madelyn Flavors, PT 11/04/18 4:33 PM; [address] [place], [place], [address] Phone: [phone]   Fax:  [phone]

## 2018-11-04 ENCOUNTER — Ambulatory Visit: Payer: Medicare Other | Admitting: Physical Therapy

## 2018-11-04 ENCOUNTER — Other Ambulatory Visit: Payer: Self-pay

## 2018-11-04 DIAGNOSIS — R293 Abnormal posture: Secondary | ICD-10-CM

## 2018-11-04 DIAGNOSIS — M6281 Muscle weakness (generalized): Secondary | ICD-10-CM

## 2018-11-04 DIAGNOSIS — M5441 Lumbago with sciatica, right side: Secondary | ICD-10-CM | POA: Diagnosis not present

## 2018-11-04 NOTE — Therapy (Signed)
[address] [place], [place], [address] Phone: [phone]   Fax:  [phone]  Physical Therapy Treatment  Patient Details  Name: [name] MRN: [id number] Date of Birth: Dec 26, 1935 Referring Provider (PT): Delice Lesch   Encounter Date: 11/04/2018  PT End of Session - 11/04/18 1258    Visit Number  8    Date for PT Re-Evaluation  11/29/18    PT Start Time  1257    PT Stop Time  1356    PT Time Calculation (min)  59 min    Activity Tolerance  Patient tolerated treatment well    Behavior During Therapy  [facility] for tasks assessed/performed       Past Medical History:  Diagnosis Date  . Asthma   . Atrophic vaginitis   . Breast cancer, Left 12/20/2010   NO BLOOD PRESSURE CHECKS OR STICKS IN LEFT ARM  . CAD (coronary artery disease)    a. 01/19/2010 s/p CABG x 3, lima->lad, vg->diag, vg->om1;  b. 06/2011 :Lexi MV: EF 84%, No ischemia. c. 01/06/14 s/p negative nuclear stress test with EF >70%  . Cervical arthritis   . Colonic polyp 04-27-2009   tubular adenoma  . Diverticulosis of colon (without mention of hemorrhage)   . Fibromyalgia   . Gallstones   . GERD (gastroesophageal reflux disease)   . Glaucoma   . Headache(784.0)    a. frequently assocaited with high BPs  . Hiatal hernia   . Hypercholesterolemia   . Irritable bowel syndrome   . Labile hypertension   . Lymphedema    left arm  . Pinched vertebral nerve     Past Surgical History:  Procedure Laterality Date  . [place] STUDY N/A 01/08/2016   Procedure: [place] STUDY;  Surgeon: Ladene Artist, MD;  Location: [facility];  Service: Endoscopy;  Laterality: N/A;  . BREAST LUMPECTOMY Left 02/06/11  . CATARACT EXTRACTION, BILATERAL     bilateral caaract removal,  . CORONARY ANGIOPLASTY WITH STENT PLACEMENT     Stent 2007  . CORONARY ARTERY BYPASS GRAFT    . ESOPHAGEAL MANOMETRY N/A 01/08/2016   Procedure: ESOPHAGEAL MANOMETRY (EM);   Surgeon: Ladene Artist, MD;  Location: [facility];  Service: Endoscopy;  Laterality: N/A;  . open heart surgery     01/19/2010    There were no vitals filed for this visit.  Subjective Assessment - 11/04/18 1259    Subjective  patient in a lot of pain today down BLE to feet. She also reports some numbness in her RLE and hand. She stood a lot yesterday preparing apples. DN didn't make it worse.    Patient Stated Goals  to decrease pain    Currently in Pain?  Yes    Pain Score  6     Pain Location  Back    Pain Orientation  Right;Left    Pain Descriptors / Indicators  Aching;Numbness    Pain Type  Chronic pain    Pain Radiating Towards  to BLE today    Pain Onset  More than a month ago    Pain Frequency  Constant                       [facility] Adult PT Treatment/Exercise - 11/04/18 0001      Lumbar Exercises: Aerobic   Nustep  L4 6 min      Lumbar Exercises: Standing   Other  Standing Lumbar Exercises  ext x 10 no change in sx      Lumbar Exercises: Supine   Straight Leg Raise  20 reps   2 sets   Straight Leg Raises Limitations  2#    Other Supine Lumbar Exercises  Bridges, KTC, obliques 2x10      Knee/Hip Exercises: Standing   Hip Abduction  Stengthening;Right;2 sets;10 reps;Knee straight   2#   Hip Extension  Stengthening;Right;2 sets;10 reps;Knee straight   2#   Other Standing Knee Exercises  steps ups, fwd, side-stepping 2x10. standing hip flexion 2# 2x10      Knee/Hip Exercises: Seated   Long Arc Quad  Strengthening;Right;15 reps;Weights;2 sets    Long Arc Quad Weight  2 lbs.      Modalities   Modalities  Moist Heat      Moist Heat Therapy   Number Minutes Moist Heat  10 Minutes    Moist Heat Location  Hip;Lumbar Spine      Manual Therapy   Manual Therapy  Soft tissue mobilization;Myofascial release    Soft tissue mobilization  to right glut min/med, lateral quads    Myofascial Release  to R ITB               PT Short Term Goals -  11/04/18 1327      PT SHORT TERM GOAL #1   Title  pt will be I with initial HEP      Baseline  pt reports she is not fully compliant with HEP    Status  On-going        PT Long Term Goals - 11/04/18 1328      PT LONG TERM GOAL #1   Title  Pt I with advanced HEP    Status  Partially Met      PT LONG TERM GOAL #2   Title  Patient to report decreased frequency of RLE pain by 50% or more with ADLS.    Status  Partially Met            Plan - 11/04/18 1628    Clinical Impression Statement  Patient presented with increased pain today after standing a lot yesterday. Her pain decreased with supine lying exercises and did not increase after this for 20 min of standing exercises. Patient advised to lie down for 5 min in hooklying every 2 hours or so to increase endurance.    PT Treatment/Interventions  ADLs/Self Care Home Management;Cryotherapy;Electrical Stimulation;Moist Heat;Traction;Therapeutic exercise;Neuromuscular re-education;Patient/family education;Manual techniques;Dry needling;Taping;Iontophoresis 79m/ml Dexamethasone;Ultrasound    PT Next Visit Plan  contine to progress TE ,address ITB and SI stab;       Patient will benefit from skilled therapeutic intervention in order to improve the following deficits and impairments:  Decreased activity tolerance, Decreased strength, Pain, Decreased range of motion, Impaired flexibility, Abnormal gait, Improper body mechanics, Decreased mobility, Increased muscle spasms, Difficulty walking  Visit Diagnosis: Muscle weakness (generalized)  Abnormal posture  Acute right-sided low back pain with right-sided sciatica     Problem List Patient Active Problem List   Diagnosis Date Noted  . Viral sinusitis 09/25/2018  . HTN (hypertension) 04/12/2018  . Lumbar radiculopathy 04/12/2018  . Degeneration of lumbar intervertebral disc 12/16/2017  . Spinal stenosis of lumbar region 12/16/2017  . Osteoarthritis of hip 09/25/2017  . Low  back pain 09/25/2017  . S/P CABG (coronary artery bypass graft) 12/19/2016  . 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  . Heartburn 05/23/2015  . OSA on CPAP 11/23/2014  . Generalized  anxiety disorder 02/04/2014  . Labile hypertension   . Fibromyalgia   . Lumbar adjacent segment disease with spondylolisthesis 12/24/2013  . CAD (coronary artery disease) 12/24/2013  . Bronchitis, chronic obstructive w acute bronchitis ([place]) 12/21/2013  . Diverticulosis of colon without hemorrhage 11/15/2013  . Hot flashes related to aromatase inhibitor therapy 07/27/2013  . Osteopenia 07/27/2013  . DJD (degenerative joint disease) 09/29/2012  . Breast cancer, Left 12/20/2010  . FATTY LIVER DISEASE 01/09/2010  . Allergic rhinitis 02/18/2007  . DIZZINESS, CHRONIC 02/18/2007  . Headache in back of head 02/18/2007  . Mixed hyperlipidemia 12/23/2006  . GLAUCOMA 12/23/2006  . Coronary atherosclerosis 12/23/2006  . Asthma 12/23/2006  . Gastroesophageal reflux disease 12/23/2006  . IRRITABLE BOWEL SYNDROME 12/23/2006  . Myalgia and myositis 12/23/2006    Madelyn Flavors PT 11/04/2018, 4:31 PM  [facility] [address] [place], [place], [address] Phone: [phone]   Fax:  [phone]  Name: [name] MRN: [id number] Date of Birth: 1935-03-30

## 2018-11-10 ENCOUNTER — Ambulatory Visit: Payer: Medicare Other | Admitting: Physical Therapy

## 2018-11-10 ENCOUNTER — Encounter: Payer: Self-pay | Admitting: Physical Therapy

## 2018-11-10 ENCOUNTER — Other Ambulatory Visit: Payer: Self-pay

## 2018-11-10 DIAGNOSIS — M6281 Muscle weakness (generalized): Secondary | ICD-10-CM

## 2018-11-10 DIAGNOSIS — R293 Abnormal posture: Secondary | ICD-10-CM

## 2018-11-10 DIAGNOSIS — M5441 Lumbago with sciatica, right side: Secondary | ICD-10-CM

## 2018-11-10 NOTE — Therapy (Signed)
[place], [place], [address] Phone: [phone]   Fax:  [phone]  Physical Therapy Treatment  Patient Details  Name: [name] MRN: [id number] Date of Birth: 1935/07/16 Referring Provider (PT): Delice Lesch   Encounter Date: 11/10/2018  PT End of Session - 11/10/18 1228    PT Start Time  1146    PT Stop Time  1230    PT Time Calculation (min)  44 min    Activity Tolerance  Patient tolerated treatment well    Behavior During Therapy  [facility] for tasks assessed/performed       Past Medical History:  Diagnosis Date  . Asthma   . Atrophic vaginitis   . Breast cancer, Left 12/20/2010   NO BLOOD PRESSURE CHECKS OR STICKS IN LEFT ARM  . CAD (coronary artery disease)    a. 01/19/2010 s/p CABG x 3, lima->lad, vg->diag, vg->om1;  b. 06/2011 :Lexi MV: EF 84%, No ischemia. c. 01/06/14 s/p negative nuclear stress test with EF >70%  . Cervical arthritis   . Colonic polyp 04-27-2009   tubular adenoma  . Diverticulosis of colon (without mention of hemorrhage)   . Fibromyalgia   . Gallstones   . GERD (gastroesophageal reflux disease)   . Glaucoma   . Headache(784.0)    a. frequently assocaited with high BPs  . Hiatal hernia   . Hypercholesterolemia   . Irritable bowel syndrome   . Labile hypertension   . Lymphedema    left arm  . Pinched vertebral nerve     Past Surgical History:  Procedure Laterality Date  . [place] STUDY N/A 01/08/2016   Procedure: [place] STUDY;  Surgeon: Ladene Artist, MD;  Location: [facility];  Service: Endoscopy;  Laterality: N/A;  . BREAST LUMPECTOMY Left 02/06/11  . CATARACT EXTRACTION, BILATERAL     bilateral caaract removal,  . CORONARY ANGIOPLASTY WITH STENT PLACEMENT     Stent 2007  . CORONARY ARTERY BYPASS GRAFT    . ESOPHAGEAL MANOMETRY N/A 01/08/2016   Procedure: ESOPHAGEAL MANOMETRY (EM);  Surgeon: Ladene Artist, MD;  [facility];   Service: Endoscopy;  Laterality: N/A;  . open heart surgery     01/19/2010    There were no vitals filed for this visit.  Subjective Assessment - 11/10/18 1150    Subjective  Pt pain primarily in Rt glute and travels down the leg, numbness also present. cutting ocra, but not in garden a whole lot. both legs seem to get tired easier    Pain Score  5     Pain Location  Back    Pain Orientation  Right                       [facility] Adult PT Treatment/Exercise - 11/10/18 0001      Self-Care   Self-Care  --      Manual Therapy   Manual Therapy  Soft tissue mobilization    Manual therapy comments  prolonged ext. in prone, repeated extension in standing.    Soft tissue mobilization  Rt glute   did not alleviate pain.   Passive ROM  glute, pirformis stretch             PT Education - 11/10/18 1225    Education Details  explained importance of extension exercises in-between activities. repeated extension in standing and in pron, explained centralization of pain to pt.  Person(s) Educated  Patient    Methods  Explanation;Demonstration;Handout    Comprehension  Verbalized understanding;Returned demonstration       PT Short Term Goals - 11/04/18 1327      PT SHORT TERM GOAL #1   Title  pt will be I with initial HEP      Baseline  pt reports she is not fully compliant with HEP    Status  On-going        PT Long Term Goals - 11/04/18 1328      PT LONG TERM GOAL #1   Title  Pt I with advanced HEP    Status  Partially Met      PT LONG TERM GOAL #2   Title  Patient to report decreased frequency of RLE pain by 50% or more with ADLS.    Status  Partially Met            Plan - 11/10/18 1229    Clinical Impression Statement  pt. reported pain in Rt glute area and assessed area with manual therapy. manual therapy did not relieve pain however adjusting table promoting back extension caused relief.    PT Treatment/Interventions  ADLs/Self Care Home  Management;Cryotherapy;Electrical Stimulation;Moist Heat;Traction;Therapeutic exercise;Neuromuscular re-education;Patient/family education;Manual techniques;Dry needling;Taping;Iontophoresis '4mg'$ /ml Dexamethasone;Ultrasound    PT Next Visit Plan  assess effectiveness of extension exercises and add extension strengthening exercises.    PT Home Exercise Plan  follow handouts promoting back extension       Patient will benefit from skilled therapeutic intervention in order to improve the following deficits and impairments:  Decreased activity tolerance, Decreased strength, Pain, Decreased range of motion, Impaired flexibility, Abnormal gait, Improper body mechanics, Decreased mobility, Increased muscle spasms, Difficulty walking  Visit Diagnosis: Muscle weakness (generalized)  Abnormal posture  Acute right-sided low back pain with right-sided sciatica     Problem List Patient Active Problem List   Diagnosis Date Noted  . Viral sinusitis 09/25/2018  . HTN (hypertension) 04/12/2018  . Lumbar radiculopathy 04/12/2018  . Degeneration of lumbar intervertebral disc 12/16/2017  . Spinal stenosis of lumbar region 12/16/2017  . Osteoarthritis of hip 09/25/2017  . Low back pain 09/25/2017  . S/P CABG (coronary artery bypass graft) 12/19/2016  . 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  . Heartburn 05/23/2015  . OSA on CPAP 11/23/2014  . Generalized anxiety disorder 02/04/2014  . Labile hypertension   . Fibromyalgia   . Lumbar adjacent segment disease with spondylolisthesis 12/24/2013  . CAD (coronary artery disease) 12/24/2013  . Bronchitis, chronic obstructive w acute bronchitis ([place]) 12/21/2013  . Diverticulosis of colon without hemorrhage 11/15/2013  . Hot flashes related to aromatase inhibitor therapy 07/27/2013  . Osteopenia 07/27/2013  . DJD (degenerative joint disease) 09/29/2012  . Breast cancer, Left 12/20/2010  . FATTY LIVER DISEASE 01/09/2010  . Allergic rhinitis 02/18/2007  .  DIZZINESS, CHRONIC 02/18/2007  . Headache in back of head 02/18/2007  . Mixed hyperlipidemia 12/23/2006  . GLAUCOMA 12/23/2006  . Coronary atherosclerosis 12/23/2006  . Asthma 12/23/2006  . Gastroesophageal reflux disease 12/23/2006  . IRRITABLE BOWEL SYNDROME 12/23/2006  . Myalgia and myositis 12/23/2006    Darcella Cheshire, SPTA 11/10/2018, 12:37 PM  [place] [address] [place], [place], [address] Phone: [phone]   Fax:  [phone]  Name: [name] MRN: [id number] Date of Birth: 1935-12-06

## 2018-11-18 ENCOUNTER — Ambulatory Visit: Payer: Medicare Other | Admitting: Physical Therapy

## 2018-11-18 ENCOUNTER — Other Ambulatory Visit: Payer: Self-pay

## 2018-11-18 DIAGNOSIS — M5441 Lumbago with sciatica, right side: Secondary | ICD-10-CM

## 2018-11-18 DIAGNOSIS — R293 Abnormal posture: Secondary | ICD-10-CM

## 2018-11-18 DIAGNOSIS — M6281 Muscle weakness (generalized): Secondary | ICD-10-CM

## 2018-11-18 NOTE — Patient Instructions (Addendum)
[id number]

## 2018-11-18 NOTE — Therapy (Addendum)
[place], [place], [address] Phone: [phone]   Fax:  [phone]  Physical Therapy Treatment  Patient Details  Name: [name] MRN: [id number] Date of Birth: Dec 11, 1935 Referring Provider (PT): Delice Lesch   Encounter Date: 11/18/2018   Physical Therapy Progress Note  Dates of Reporting Period: 09/29/18 to 11/18/18    PT End of Session - 11/18/18 1258     Visit Number  10    Date for PT Re-Evaluation  11/29/18    PT Start Time  1300    PT Stop Time  1345    PT Time Calculation (min)  45 min    Activity Tolerance  Patient tolerated treatment well    Behavior During Therapy  [facility] for tasks assessed/performed        Past Medical History:  Diagnosis Date   Asthma    Atrophic vaginitis    Breast cancer, Left 12/20/2010   NO BLOOD PRESSURE CHECKS OR STICKS IN LEFT ARM   CAD (coronary artery disease)    a. 01/19/2010 s/p CABG x 3, lima->lad, vg->diag, vg->om1;  b. 06/2011 :Lexi MV: EF 84%, No ischemia. c. 01/06/14 s/p negative nuclear stress test with EF >70%   Cervical arthritis    Colonic polyp 04-27-2009   tubular adenoma   Diverticulosis of colon (without mention of hemorrhage)    Fibromyalgia    Gallstones    GERD (gastroesophageal reflux disease)    Glaucoma    Headache(784.0)    a. frequently assocaited with high BPs   Hiatal hernia    Hypercholesterolemia    Irritable bowel syndrome    Labile hypertension    Lymphedema    left arm   Pinched vertebral nerve     Past Surgical History:  Procedure Laterality Date   76 HOUR [place] STUDY N/A 01/08/2016   Procedure: 24 HOUR [place] STUDY;  Surgeon: Ladene Artist, MD;  [facility];  Service: Endoscopy;  Laterality: N/A;   BREAST LUMPECTOMY Left 02/06/11   CATARACT EXTRACTION, BILATERAL     bilateral caaract removal,   CORONARY ANGIOPLASTY WITH STENT PLACEMENT     Stent 2007   CORONARY ARTERY BYPASS GRAFT      ESOPHAGEAL MANOMETRY N/A 01/08/2016   Procedure: ESOPHAGEAL MANOMETRY (EM);  Surgeon: Ladene Artist, MD;  [facility];  Service: Endoscopy;  Laterality: N/A;   open heart surgery     01/19/2010    There were no vitals filed for this visit.  Subjective Assessment - 11/18/18 1302     Subjective  Not doing good today.Got home from a trip last night and today I'm hurting. 5-6/10 in right hip and leg.    Patient Stated Goals  to decrease pain    Currently in Pain?  Yes    Pain Score  5     Pain Location  Back    Pain Orientation  Right    Pain Descriptors / Indicators  Aching;Numbness          [facility] PT Assessment - 11/18/18 0001       AROM   Overall AROM Comments  full lumbar ROM      Strength   Right Hip Flexion  5/5    Right Hip Extension  5/5    Right Hip ABduction  5/5    Left Hip Flexion  4+/5    Left Hip Extension  5/5    Left Hip ABduction  5/5  Right Knee Flexion  5/5    Right Knee Extension  5/5    Left Knee Flexion  4+/5    Left Knee Extension  5/5                    [facility] Adult PT Treatment/Exercise - 11/18/18 0001       Lumbar Exercises: Aerobic   Nustep  L4 x 7 min      Lumbar Exercises: Machines for Strengthening   Cybex Knee Flexion  5# Left only x 15; BLE 15# 2 x 10      Lumbar Exercises: Standing   Other Standing Lumbar Exercises  ext x 10      Lumbar Exercises: Supine   Bridge  20 reps      Lumbar Exercises: Prone   Other Prone Lumbar Exercises  POE x 1 min no change; prone press ups 2x10 decreases pain to 2/10      Knee/Hip Exercises: Stretches   Other Knee/Hip Stretches  standing lunge stretch for bil hip flexors x 30 sec each      Knee/Hip Exercises: Standing   Other Standing Knee Exercises  marching 3# 2x10 ea      Knee/Hip Exercises: Seated   Marching  Strengthening;Both;2 sets;10 reps;Weights    Marching Weights  3 lbs.              PT Education - 11/18/18 1440     Education Details  seated  HS curl    Person(s) Educated  Patient    Methods  Explanation;Demonstration;Handout    Comprehension  Verbalized understanding;Returned demonstration        PT Short Term Goals - 11/18/18 1312       PT SHORT TERM GOAL #1   Title  pt will be I with initial HEP      Baseline  pt reports she is not fully compliant with HEP    Status  On-going         PT Long Term Goals - 11/18/18 1312       PT LONG TERM GOAL #1   Title  Pt I with advanced HEP    Time  8    Period  Weeks    Status  On-going      PT LONG TERM GOAL #2   Title  Patient to report decreased frequency of RLE pain by 50% or more with ADLS.    Time  8    Period  Weeks    Status  Partially Met      PT LONG TERM GOAL #3   Title  increase lumbar ROM 25%    Baseline  full lumbar ROM    Status  Achieved      PT LONG TERM GOAL #4   Title  Patient to demo improved strength in RLE strength to 4+/5    Status  Partially Met             Plan - 11/18/18 1347     Clinical Impression Statement  Patient is progressing well toward goals with ROM and strength significantly improved. FOTO has improved from 60% deficit to 41%.    PT Frequency  2x / week    PT Duration  8 weeks    PT Treatment/Interventions  ADLs/Self Care Home Management;Cryotherapy;Electrical Stimulation;Moist Heat;Traction;Therapeutic exercise;Neuromuscular re-education;Patient/family education;Manual techniques;Dry needling;Taping;Iontophoresis '4mg'$ /ml Dexamethasone;Ultrasound    PT Next Visit Plan  continue to push extension exercises    Consulted and Agree with Plan of Care  Patient        Patient will benefit from skilled therapeutic intervention in order to improve the following deficits and impairments:  Decreased activity tolerance, Decreased strength, Pain, Decreased range of motion, Impaired flexibility, Abnormal gait, Improper body mechanics, Decreased mobility, Increased muscle spasms, Difficulty walking  Visit Diagnosis: Muscle  weakness (generalized)  Abnormal posture  Acute right-sided low back pain with right-sided sciatica     Problem List Patient Active Problem List   Diagnosis Date Noted   Viral sinusitis 09/25/2018   HTN (hypertension) 04/12/2018   Lumbar radiculopathy 04/12/2018   Degeneration of lumbar intervertebral disc 12/16/2017   Spinal stenosis of lumbar region 12/16/2017   Osteoarthritis of hip 09/25/2017   Low back pain 09/25/2017   S/P CABG (coronary artery bypass graft) 12/19/2016   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   Heartburn 05/23/2015   OSA on CPAP 11/23/2014   Generalized anxiety disorder 02/04/2014   Labile hypertension    Fibromyalgia    Lumbar adjacent segment disease with spondylolisthesis 12/24/2013   CAD (coronary artery disease) 12/24/2013   Bronchitis, chronic obstructive w acute bronchitis ([place]) 12/21/2013   Diverticulosis of colon without hemorrhage 11/15/2013   Hot flashes related to aromatase inhibitor therapy 07/27/2013   Osteopenia 07/27/2013   DJD (degenerative joint disease) 09/29/2012   Breast cancer, Left 12/20/2010   FATTY LIVER DISEASE 01/09/2010   Allergic rhinitis 02/18/2007   DIZZINESS, CHRONIC 02/18/2007   Headache in back of head 02/18/2007   Mixed hyperlipidemia 12/23/2006   GLAUCOMA 12/23/2006   Coronary atherosclerosis 12/23/2006   Asthma 12/23/2006   Gastroesophageal reflux disease 12/23/2006   IRRITABLE BOWEL SYNDROME 12/23/2006   Myalgia and myositis 12/23/2006    Madelyn Flavors PT 11/18/2018, 2:42 PM  [place] [address] [place], [place], [address] Phone: [phone]   Fax:  [phone]  Name: [name] MRN: [id number] Date of Birth: Jan 14, 1936   PHYSICAL THERAPY DISCHARGE SUMMARY  Visits from Start of Care: 10 Plan: Patient agrees to discharge.  Patient goals were met. Patient is being discharged due to - not returning since last visit.    Discharge updated today to close old episode. Signing therapist did not treat patient. Goals and progress not updated, just doing d/c.    Lyndee Hensen, PT, DPT 10:36 AM  01/11/22

## 2018-11-21 ENCOUNTER — Other Ambulatory Visit: Payer: Self-pay

## 2018-11-21 ENCOUNTER — Ambulatory Visit (INDEPENDENT_AMBULATORY_CARE_PROVIDER_SITE_OTHER): Payer: Medicare Other | Admitting: Family Medicine

## 2018-11-21 DIAGNOSIS — Z20822 Contact with and (suspected) exposure to covid-19: Secondary | ICD-10-CM | POA: Insufficient documentation

## 2018-11-21 DIAGNOSIS — R6889 Other general symptoms and signs: Secondary | ICD-10-CM

## 2018-11-21 NOTE — Assessment & Plan Note (Signed)
Patient symptoms are concerning for COVID-19 infection.  This could also represent some other viral infection.  Discussed getting tested at the old [facility] [place] testing site on Monday.  Order has been placed.  Advised the patient of strict quarantine precautions for her and her husband.  Discussed that if she needed anything from the pharmacy she should have somebody drop it off at her front door.  Advised that she needed to remain on quarantine at least until the results come back and we release her from quarantine.  She is given reasons to seek medical attention in the emergency department.

## 2018-11-21 NOTE — Progress Notes (Signed)
Virtual Visit via telephone Note  This visit type was conducted due to national recommendations for restrictions regarding the COVID-19 pandemic (e.g. social distancing).  This format is felt to be most appropriate for this patient at this time.  All issues noted in this document were discussed and addressed.  No physical exam was performed (except for noted visual exam findings with Video Visits).   I connected with [name] today at  9:20 AM EDT by telephone and verified that I am speaking with the correct person using two identifiers. Location patient: home Location provider: work  Persons participating in the virtual visit: patient, provider  I discussed the limitations, risks, security and privacy concerns of performing an evaluation and management service by telephone and the availability of in person appointments. I also discussed with the patient that there may be a patient responsible charge related to this service. The patient expressed understanding and agreed to proceed.  Interactive audio and video telecommunications were attempted between this provider and patient, however failed, due to patient having technical difficulties OR patient did not have access to video capability.  We continued and completed visit with audio only.   Reason for visit: same day visit  HPI: Respiratory illness: Patient notes onset of symptoms 4 days ago.  Started with achiness and chills.  Subsequently developed headache with pain in her forehead and cheeks as well as the back of her head associated with some congestion.  She has been coughing as well and is producing thick clear mucus.  She is not getting much out of her nose.  T-max 98.8 F.  She reports bad chills last night.  No rhinorrhea.  No shortness of breath.  No COVID-19 exposure.  She has been taking Tylenol intermittently.  She has gargled with salt water and on a saline rinse.   ROS: See pertinent positives and negatives per HPI.   Past Medical History:  Diagnosis Date  . Asthma   . Atrophic vaginitis   . Breast cancer, Left 12/20/2010   NO BLOOD PRESSURE CHECKS OR STICKS IN LEFT ARM  . CAD (coronary artery disease)    a. 01/19/2010 s/p CABG x 3, lima->lad, vg->diag, vg->om1;  b. 06/2011 :Lexi MV: EF 84%, No ischemia. c. 01/06/14 s/p negative nuclear stress test with EF >70%  . Cervical arthritis   . Colonic polyp 04-27-2009   tubular adenoma  . Diverticulosis of colon (without mention of hemorrhage)   . Fibromyalgia   . Gallstones   . GERD (gastroesophageal reflux disease)   . Glaucoma   . Headache(784.0)    a. frequently assocaited with high BPs  . Hiatal hernia   . Hypercholesterolemia   . Irritable bowel syndrome   . Labile hypertension   . Lymphedema    left arm  . Pinched vertebral nerve     Past Surgical History:  Procedure Laterality Date  . [place] STUDY N/A 01/08/2016   Procedure: [place] STUDY;  Surgeon: Ladene Artist, MD;  [facility];  Service: Endoscopy;  Laterality: N/A;  . BREAST LUMPECTOMY Left 02/06/11  . CATARACT EXTRACTION, BILATERAL     bilateral caaract removal,  . CORONARY ANGIOPLASTY WITH STENT PLACEMENT     Stent 2007  . CORONARY ARTERY BYPASS GRAFT    . ESOPHAGEAL MANOMETRY N/A 01/08/2016   Procedure: ESOPHAGEAL MANOMETRY (EM);  Surgeon: Ladene Artist, MD;  [facility];  Service: Endoscopy;  Laterality: N/A;  . open heart surgery  01/19/2010    Family History  Problem Relation Age of Onset  . Stroke Mother   . Stroke Father   . Prostate cancer Father   . Breast cancer Sister   . Diabetes Sister   . Cancer Brother        gland cancer  . Heart disease Brother   . Colon cancer Neg Hx   . Stomach cancer Neg Hx   . Pancreatic cancer Neg Hx   . Kidney disease Neg Hx   . Liver disease Neg Hx     SOCIAL HX: Non-smoker   Current Outpatient Medications:  .  albuterol (VENTOLIN HFA) 108 (90 Base) MCG/ACT inhaler, Inhale 2 puffs into  the lungs every 6 (six) hours as needed for wheezing or shortness of breath., Disp: , Rfl:  .  AMBULATORY NON FORMULARY MEDICATION, as needed. FDGARD, Disp: , Rfl:  .  aspirin EC 81 MG tablet, Take 81 mg by mouth daily., Disp: , Rfl:  .  CALCIUM PO, Take by mouth., Disp: , Rfl:  .  EPINEPHrine 0.3 mg/0.3 mL IJ SOAJ injection, UAD for severe reaction, Disp: , Rfl:  .  fexofenadine (ALLEGRA) 180 MG tablet, Take 180 mg by mouth daily.  , Disp: , Rfl:  .  Glucosamine-Chondroit-Vit C-Mn (GLUCOSAMINE CHONDR 1500 COMPLX PO), Take 1 tablet by mouth 3 (three) times daily.  , Disp: , Rfl:  .  hydrALAZINE (APRESOLINE) 50 MG tablet, Take 0.5 tablets (25 mg total) by mouth 3 (three) times daily., Disp: 135 tablet, Rfl: 1 .  latanoprost (XALATAN) 0.005 % ophthalmic solution, Place 1 drop into both eyes at bedtime. , Disp: , Rfl:  .  losartan (COZAAR) 100 MG tablet, TAKE 1/2 TABLET BY MOUTH THREE TIMES A DAY, Disp: 135 tablet, Rfl: 0 .  metoprolol tartrate (LOPRESSOR) 50 MG tablet, TAKE 1 TABLET BY MOUTH THREE TIMES A DAY, Disp: 270 tablet, Rfl: 3 .  nitroGLYCERIN (NITROSTAT) 0.4 MG SL tablet, Place 1 tablet (0.4 mg total) under the tongue every 5 (five) minutes as needed for chest pain., Disp: 25 tablet, Rfl: 2 .  pantoprazole (PROTONIX) 40 MG tablet, Take 1 tablet (40 mg total) by mouth 2 (two) times daily. Take 30-60 minutes before breakfast, Disp: 60 tablet, Rfl: 11 .  Psyllium (METAMUCIL FIBER PO), Take 1 tablet by mouth daily. , Disp: , Rfl:  .  rosuvastatin (CRESTOR) 5 MG tablet, TAKE 1 TABLET BY MOUTH 3 TIMES A WEEK. (TAKE ON MONDAY, WEDNESDAY, AND FRIDAY), Disp: 30 tablet, Rfl: 5 .  sucralfate (CARAFATE) 1 g tablet, TAKE 1 TABLET BY MOUTH BY MOUTH TWICE DAILY(ONE BEFORE DINNER), Disp: 60 tablet, Rfl: 1 .  traMADol (ULTRAM) 50 MG tablet, Take 1/2 to 1 tablet by mouth three times daily as needed, Disp: 90 tablet, Rfl: 5 .  vitamin B-12 (CYANOCOBALAMIN) 1000 MCG tablet, Take 1,000 mcg by mouth daily.  ,  Disp: , Rfl:  .  VITAMIN D PO, Take by mouth., Disp: , Rfl:   EXAM: This was a telehealth telephone visit and thus no physical exam was completed.   ASSESSMENT AND PLAN:  Discussed the following assessment and plan:  Suspected Covid-19 Virus Infection Patient symptoms are concerning for COVID-19 infection.  This could also represent some other viral infection.  Discussed getting tested at the [facility] [place] testing site on Monday.  Order has been placed.  Advised the patient of strict quarantine precautions for her and her husband.  Discussed that if she needed anything from the  pharmacy she should have somebody drop it off at her front door.  Advised that she needed to remain on quarantine at least until the results come back and we release her from quarantine.  She is given reasons to seek medical attention in the emergency department.    I discussed the assessment and treatment plan with the patient. The patient was provided an opportunity to ask questions and all were answered. The patient agreed with the plan and demonstrated an understanding of the instructions.   The patient was advised to call back or seek an in-person evaluation if the symptoms worsen or if the condition fails to improve as anticipated.  I provided 12 minutes of non-face-to-face time during this encounter.   Tommi Rumps, MD

## 2018-11-23 ENCOUNTER — Telehealth: Payer: Self-pay | Admitting: Gastroenterology

## 2018-11-23 ENCOUNTER — Other Ambulatory Visit: Payer: Self-pay

## 2018-11-23 DIAGNOSIS — Z20822 Contact with and (suspected) exposure to covid-19: Secondary | ICD-10-CM

## 2018-11-23 NOTE — Telephone Encounter (Signed)
Pt states her stomach has been bothering her again. Pt reports she is taking the protonix and carafate like she is supposed to but she has been out of the FD gard. Pt has gotten some more of the FD gard. Reviewed with her the recommendations from Dr. William Dalton last [place] note. She will resume the FD gard and take it regularly for several days and call back if no improvement.

## 2018-11-24 LAB — NOVEL CORONAVIRUS, NAA: SARS-CoV-2, NAA: NOT DETECTED

## 2018-11-25 ENCOUNTER — Ambulatory Visit: Payer: Medicare Other

## 2018-11-25 ENCOUNTER — Encounter: Payer: Self-pay | Admitting: Physical Therapy

## 2018-11-26 ENCOUNTER — Encounter: Payer: Self-pay | Admitting: Family Medicine

## 2018-11-26 ENCOUNTER — Ambulatory Visit (INDEPENDENT_AMBULATORY_CARE_PROVIDER_SITE_OTHER): Payer: Medicare Other | Admitting: Family Medicine

## 2018-11-26 ENCOUNTER — Other Ambulatory Visit: Payer: Self-pay

## 2018-11-26 VITALS — BP 132/100 | HR 68 | Temp 95.4°F

## 2018-11-26 DIAGNOSIS — L299 Pruritus, unspecified: Secondary | ICD-10-CM | POA: Diagnosis not present

## 2018-11-26 DIAGNOSIS — I2510 Atherosclerotic heart disease of native coronary artery without angina pectoris: Secondary | ICD-10-CM | POA: Diagnosis not present

## 2018-11-26 LAB — URINALYSIS, ROUTINE W REFLEX MICROSCOPIC
Hgb urine dipstick: NEGATIVE
Ketones, ur: NEGATIVE
Leukocytes,Ua: NEGATIVE
Nitrite: NEGATIVE
RBC / HPF: NONE SEEN (ref 0–?)
Specific Gravity, Urine: <=1.005 — AB (ref 1.000–1.030)
Urine Glucose: >=1000 — AB
Urobilinogen, UA: 0.2 (ref 0.0–1.0)
pH: 6.5 (ref 5.0–8.0)

## 2018-11-26 LAB — COMPREHENSIVE METABOLIC PANEL
ALT: 114 U/L — ABNORMAL HIGH (ref 0–35)
AST: 63 U/L — ABNORMAL HIGH (ref 0–37)
Albumin: 3.9 g/dL (ref 3.5–5.2)
Alkaline Phosphatase: 409 U/L — ABNORMAL HIGH (ref 39–117)
BUN: 10 mg/dL (ref 6–23)
CO2: 27 mEq/L (ref 19–32)
Calcium: 9.5 mg/dL (ref 8.4–10.5)
Chloride: 91 mEq/L — ABNORMAL LOW (ref 96–112)
Creatinine, Ser: 0.66 mg/dL (ref 0.40–1.20)
GFR: 85.91 mL/min (ref >60.00)
Glucose, Bld: 364 mg/dL — ABNORMAL HIGH (ref 70–99)
Potassium: 3.8 mEq/L (ref 3.5–5.1)
Sodium: 127 mEq/L — ABNORMAL LOW (ref 135–145)
Total Bilirubin: 7.5 mg/dL — ABNORMAL HIGH (ref 0.2–1.2)
Total Protein: 7.4 g/dL (ref 6.0–8.3)

## 2018-11-26 LAB — CBC WITH DIFFERENTIAL/PLATELET
Basophils Absolute: 0 10*3/uL (ref 0.0–0.1)
Basophils Relative: 0.7 % (ref 0.0–3.0)
Eosinophils Absolute: 0.2 10*3/uL (ref 0.0–0.7)
Eosinophils Relative: 3.3 % (ref 0.0–5.0)
HCT: 31.8 % — ABNORMAL LOW (ref 36.0–46.0)
Hemoglobin: 10.7 g/dL — ABNORMAL LOW (ref 12.0–15.0)
Lymphocytes Relative: 39.1 % (ref 12.0–46.0)
Lymphs Abs: 2.3 10*3/uL (ref 0.7–4.0)
MCHC: 33.8 g/dL (ref 30.0–36.0)
MCV: 92 fl (ref 78.0–100.0)
Monocytes Absolute: 0.7 10*3/uL (ref 0.1–1.0)
Monocytes Relative: 12.9 % — ABNORMAL HIGH (ref 3.0–12.0)
Neutro Abs: 2.6 10*3/uL (ref 1.4–7.7)
Neutrophils Relative %: 44 % (ref 43.0–77.0)
Platelets: 405 10*3/uL — ABNORMAL HIGH (ref 150.0–400.0)
RBC: 3.46 Mil/uL — ABNORMAL LOW (ref 3.87–5.11)
RDW: 15.4 % (ref 11.5–15.5)
WBC: 5.8 10*3/uL (ref 4.0–10.5)

## 2018-11-26 LAB — AMMONIA: Ammonia: 44 umol/L — ABNORMAL HIGH (ref 11–35)

## 2018-11-26 NOTE — Progress Notes (Signed)
Virtual Visit via Video   Due to the COVID-19 pandemic, this visit was completed with telemedicine (audio/video) technology to reduce patient and provider exposure as well as to preserve personal protective equipment.   I connected with [name] by a video enabled telemedicine application and verified that I am speaking with the correct person using two identifiers. Location patient: Home Location provider: [facility], Office Persons participating in the virtual visit: CHALA KAZEMI, Arnette Norris, MD   I discussed the limitations of evaluation and management by telemedicine and the availability of in person appointments. The patient expressed understanding and agreed to proceed.  Care Team   Patient Care Team: Lucille Passy, MD as PCP - General (Family Medicine) Josue Hector, MD as PCP - Cardiology (Cardiology) Neldon Mc, MD as Surgeon (General Surgery) Gery Pray, MD (Radiation Oncology) Magrinat, Virgie Dad, MD (Hematology and Oncology) Cameron Sprang, MD as Consulting Physician (Neurology)  Subjective:   HPI:   Pruritis on upper body and hands- Started last week.    Saw Dr. Caryl Bis on 11/21/18 for HA, cough, fever, sinus issues.   Note reviewed-  He advised quarantine precautions and sent her for covid testing, which was negative.  Only change in her medications since she started itching was she took AZO thinking maybe she was developing a UTI  but she is no longer taking Azo but the itching remains.  She did increase her FDgard dosage but was having itching prior to this.  No rash.  Fever has resolved.  Allegra helps a little.  She has had some diarrhea and dry mouth. No changes in soaps, detergents or anything else she can think of.     Review of Systems  Constitutional: Negative for fever.  HENT: Negative.   Respiratory: Negative.   Cardiovascular: Negative.   Gastrointestinal: Positive for diarrhea. Negative for abdominal  pain, blood in stool, constipation, heartburn, melena, nausea and vomiting.  Genitourinary: Negative.   Musculoskeletal: Negative.   Skin: Positive for itching. Negative for rash.  Neurological: Negative.   Endo/Heme/Allergies: Negative.   Psychiatric/Behavioral: Negative.   All other systems reviewed and are negative.    Patient Active Problem List   Diagnosis Date Noted  . Itching 11/26/2018  . Suspected COVID-19 virus infection 11/21/2018  . HTN (hypertension) 04/12/2018  . Lumbar radiculopathy 04/12/2018  . Degeneration of lumbar intervertebral disc 12/16/2017  . Spinal stenosis of lumbar region 12/16/2017  . Osteoarthritis of hip 09/25/2017  . Low back pain 09/25/2017  . S/P CABG (coronary artery bypass graft) 12/19/2016  . 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  . Heartburn 05/23/2015  . OSA on CPAP 11/23/2014  . Generalized anxiety disorder 02/04/2014  . Labile hypertension   . Fibromyalgia   . Lumbar adjacent segment disease with spondylolisthesis 12/24/2013  . CAD (coronary artery disease) 12/24/2013  . Bronchitis, chronic obstructive w acute bronchitis ([place]) 12/21/2013  . Diverticulosis of colon without hemorrhage 11/15/2013  . Hot flashes related to aromatase inhibitor therapy 07/27/2013  . Osteopenia 07/27/2013  . DJD (degenerative joint disease) 09/29/2012  . Breast cancer, Left 12/20/2010  . FATTY LIVER DISEASE 01/09/2010  . Allergic rhinitis 02/18/2007  . DIZZINESS, CHRONIC 02/18/2007  . Headache in back of head 02/18/2007  . Mixed hyperlipidemia 12/23/2006  . GLAUCOMA 12/23/2006  . Coronary atherosclerosis 12/23/2006  . Asthma 12/23/2006  . Gastroesophageal reflux disease 12/23/2006  . IRRITABLE BOWEL SYNDROME 12/23/2006  . Myalgia and myositis 12/23/2006    Social History   Tobacco Use  .  Smoking status: Never Smoker  . Smokeless tobacco: Never Used  Substance Use Topics  . Alcohol use: No    Alcohol/week: 0.0 standard drinks    Current  Outpatient Medications:  .  albuterol (VENTOLIN HFA) 108 (90 Base) MCG/ACT inhaler, Inhale 2 puffs into the lungs every 6 (six) hours as needed for wheezing or shortness of breath., Disp: , Rfl:  .  AMBULATORY NON FORMULARY MEDICATION, as needed. FDGARD, Disp: , Rfl:  .  aspirin EC 81 MG tablet, Take 81 mg by mouth daily., Disp: , Rfl:  .  CALCIUM PO, Take by mouth., Disp: , Rfl:  .  EPINEPHrine 0.3 mg/0.3 mL IJ SOAJ injection, UAD for severe reaction, Disp: , Rfl:  .  fexofenadine (ALLEGRA) 180 MG tablet, Take 180 mg by mouth daily.  , Disp: , Rfl:  .  Glucosamine-Chondroit-Vit C-Mn (GLUCOSAMINE CHONDR 1500 COMPLX PO), Take 1 tablet by mouth 3 (three) times daily.  , Disp: , Rfl:  .  hydrALAZINE (APRESOLINE) 50 MG tablet, Take 0.5 tablets (25 mg total) by mouth 3 (three) times daily., Disp: 135 tablet, Rfl: 1 .  latanoprost (XALATAN) 0.005 % ophthalmic solution, Place 1 drop into both eyes at bedtime. , Disp: , Rfl:  .  losartan (COZAAR) 100 MG tablet, TAKE 1/2 TABLET BY MOUTH THREE TIMES A DAY, Disp: 135 tablet, Rfl: 0 .  metoprolol tartrate (LOPRESSOR) 50 MG tablet, TAKE 1 TABLET BY MOUTH THREE TIMES A DAY, Disp: 270 tablet, Rfl: 3 .  nitroGLYCERIN (NITROSTAT) 0.4 MG SL tablet, Place 1 tablet (0.4 mg total) under the tongue every 5 (five) minutes as needed for chest pain., Disp: 25 tablet, Rfl: 2 .  pantoprazole (PROTONIX) 40 MG tablet, Take 1 tablet (40 mg total) by mouth 2 (two) times daily. Take 30-60 minutes before breakfast, Disp: 60 tablet, Rfl: 11 .  Psyllium (METAMUCIL FIBER PO), Take 1 tablet by mouth daily. , Disp: , Rfl:  .  rosuvastatin (CRESTOR) 5 MG tablet, TAKE 1 TABLET BY MOUTH 3 TIMES A WEEK. (TAKE ON MONDAY, WEDNESDAY, AND FRIDAY), Disp: 30 tablet, Rfl: 5 .  sucralfate (CARAFATE) 1 g tablet, TAKE 1 TABLET BY MOUTH BY MOUTH TWICE DAILY(ONE BEFORE DINNER), Disp: 60 tablet, Rfl: 1 .  traMADol (ULTRAM) 50 MG tablet, Take 1/2 to 1 tablet by mouth three times daily as needed, Disp: 90  tablet, Rfl: 5 .  vitamin B-12 (CYANOCOBALAMIN) 1000 MCG tablet, Take 1,000 mcg by mouth daily.  , Disp: , Rfl:  .  VITAMIN D PO, Take by mouth., Disp: , Rfl:   Allergies  Allergen Reactions  . Levaquin [Levofloxacin In D5w]     Elevated BP  . Choline Fenofibrate Other (See Comments)     pt states INTOL to Trilipix w/ "thigh burning"  . Hctz [Hydrochlorothiazide]     Causes hyponatremia  . Simvastatin Other (See Comments)     pt states INTOL to STATINS \\T \ refuses to restart  . Adhesive [Tape] Rash  . Bentyl [Dicyclomine Hcl]     Made me feel weird and drained me  . Ceclor [Cefaclor] Rash  . Clarithromycin Rash  . Codeine Nausea Only  . Doxycycline Rash  . Hydrocodone     Nightmare after taking cough syrup w/hydrocodone  . Lisinopril Cough    Developed ACE cough...  . Penicillins Itching and Rash    At injection site  . Tobramycin-Dexamethasone Rash    Objective:   VITALS: Per patient if applicable, see vitals. GENERAL: Alert, appears well and  in no acute distress. HEENT: Atraumatic, conjunctiva clear, no obvious abnormalities on inspection of external nose and ears. NECK: Normal movements of the head and neck. CARDIOPULMONARY: No increased WOB. Speaking in clear sentences. I:E ratio WNL.  MS: Moves all visible extremities without noticeable abnormality. PSYCH: Pleasant and cooperative, well-groomed. Speech normal rate and rhythm. Affect is appropriate. Insight and judgement are appropriate. Attention is focused, linear, and appropriate.  NEURO: CN grossly intact. Oriented as arrived to appointment on time with no prompting. Moves both UE equally.  SKIN: No obvious lesions, wounds, erythema, or cyanosis noted on face or hands.  Depression screen [facility] 2/9 04/13/2018 12/20/2015  Decreased Interest 0 0  Down, Depressed, Hopeless 0 0  PHQ - 2 Score 0 0  Some recent data might be hidden    Assessment and Plan:   [place] was seen today for pruritis.  Diagnoses and all  orders for this visit:  Itching -     Urinalysis, Routine w reflex microscopic -     Comprehensive metabolic panel -     Ammonia    . COVID-19 Education: The signs and symptoms of COVID-19 were discussed with the patient and how to seek care for testing if needed. The importance of social distancing was discussed today. . Reviewed expectations re: course of current medical issues. . Discussed self-management of symptoms. . Outlined signs and symptoms indicating need for more acute intervention. . Patient verbalized understanding and all questions were answered. [name] Health Maintenance issues including appropriate healthy diet, exercise, and smoking avoidance were discussed with patient. . See orders for this visit as documented in the electronic medical record.  Arnette Norris, MD  Records requested if needed. Time spent:25 minutes, of which >50% was spent in obtaining information about her symptoms, reviewing her previous labs, evaluations, and treatments, counseling her about her condition (please see the discussed topics above), and developing a plan to further investigate it; she had a number of questions which I addressed.

## 2018-11-26 NOTE — Assessment & Plan Note (Signed)
>  25 minutes spent in face to face time with patient, >50% spent in counselling or coordination of care Itching is relatively new- maybe 1-2 weeks and she thinks it is related to the infection she was seen for on 11/21/18.  No rash evident.  I would like to get some labs today and UA to make sure there is not something else contributing to this.  She will try benadryl tonight and hold her allegra- that has helped her in past.  Advised her of falling precautions. The patient indicates understanding of these issues and agrees with the plan. Orders Placed This Encounter  Procedures  . Urinalysis, Routine w reflex microscopic  . Comprehensive metabolic panel  . Ammonia

## 2018-11-27 ENCOUNTER — Emergency Department (HOSPITAL_BASED_OUTPATIENT_CLINIC_OR_DEPARTMENT_OTHER): Payer: Medicare Other

## 2018-11-27 ENCOUNTER — Other Ambulatory Visit: Payer: Self-pay

## 2018-11-27 ENCOUNTER — Telehealth: Payer: Self-pay

## 2018-11-27 ENCOUNTER — Encounter (HOSPITAL_BASED_OUTPATIENT_CLINIC_OR_DEPARTMENT_OTHER): Payer: Self-pay | Admitting: Adult Health

## 2018-11-27 ENCOUNTER — Inpatient Hospital Stay (HOSPITAL_BASED_OUTPATIENT_CLINIC_OR_DEPARTMENT_OTHER)
Admission: EM | Admit: 2018-11-27 | Discharge: 2018-11-29 | DRG: 435 | Disposition: A | Discharge status: Home / Self Care | Payer: Medicare Other | Attending: Internal Medicine | Admitting: Internal Medicine

## 2018-11-27 ENCOUNTER — Telehealth: Payer: Self-pay | Admitting: Family Medicine

## 2018-11-27 DIAGNOSIS — Z955 Presence of coronary angioplasty implant and graft: Secondary | ICD-10-CM

## 2018-11-27 DIAGNOSIS — K838 Other specified diseases of biliary tract: Secondary | ICD-10-CM | POA: Diagnosis not present

## 2018-11-27 DIAGNOSIS — G4733 Obstructive sleep apnea (adult) (pediatric): Secondary | ICD-10-CM | POA: Diagnosis not present

## 2018-11-27 DIAGNOSIS — Z20828 Contact with and (suspected) exposure to other viral communicable diseases: Secondary | ICD-10-CM | POA: Diagnosis present

## 2018-11-27 DIAGNOSIS — C801 Malignant (primary) neoplasm, unspecified: Secondary | ICD-10-CM | POA: Diagnosis not present

## 2018-11-27 DIAGNOSIS — E871 Hypo-osmolality and hyponatremia: Secondary | ICD-10-CM | POA: Diagnosis not present

## 2018-11-27 DIAGNOSIS — J45909 Unspecified asthma, uncomplicated: Secondary | ICD-10-CM | POA: Diagnosis not present

## 2018-11-27 DIAGNOSIS — Z881 Allergy status to other antibiotic agents status: Secondary | ICD-10-CM | POA: Diagnosis not present

## 2018-11-27 DIAGNOSIS — E119 Type 2 diabetes mellitus without complications: Secondary | ICD-10-CM | POA: Diagnosis not present

## 2018-11-27 DIAGNOSIS — K8689 Other specified diseases of pancreas: Secondary | ICD-10-CM

## 2018-11-27 DIAGNOSIS — L299 Pruritus, unspecified: Secondary | ICD-10-CM | POA: Diagnosis not present

## 2018-11-27 DIAGNOSIS — I2510 Atherosclerotic heart disease of native coronary artery without angina pectoris: Secondary | ICD-10-CM | POA: Diagnosis not present

## 2018-11-27 DIAGNOSIS — D490 Neoplasm of unspecified behavior of digestive system: Secondary | ICD-10-CM | POA: Diagnosis not present

## 2018-11-27 DIAGNOSIS — Z9989 Dependence on other enabling machines and devices: Secondary | ICD-10-CM

## 2018-11-27 DIAGNOSIS — Z885 Allergy status to narcotic agent status: Secondary | ICD-10-CM

## 2018-11-27 DIAGNOSIS — K831 Obstruction of bile duct: Secondary | ICD-10-CM | POA: Diagnosis not present

## 2018-11-27 DIAGNOSIS — Z8042 Family history of malignant neoplasm of prostate: Secondary | ICD-10-CM

## 2018-11-27 DIAGNOSIS — Z7982 Long term (current) use of aspirin: Secondary | ICD-10-CM | POA: Diagnosis not present

## 2018-11-27 DIAGNOSIS — R1011 Right upper quadrant pain: Secondary | ICD-10-CM

## 2018-11-27 DIAGNOSIS — Z803 Family history of malignant neoplasm of breast: Secondary | ICD-10-CM

## 2018-11-27 DIAGNOSIS — R05 Cough: Secondary | ICD-10-CM | POA: Diagnosis not present

## 2018-11-27 DIAGNOSIS — R7989 Other specified abnormal findings of blood chemistry: Secondary | ICD-10-CM

## 2018-11-27 DIAGNOSIS — I10 Essential (primary) hypertension: Secondary | ICD-10-CM | POA: Diagnosis present

## 2018-11-27 DIAGNOSIS — Z880 Allergy status to penicillin: Secondary | ICD-10-CM | POA: Diagnosis not present

## 2018-11-27 DIAGNOSIS — Z8249 Family history of ischemic heart disease and other diseases of the circulatory system: Secondary | ICD-10-CM

## 2018-11-27 DIAGNOSIS — Z951 Presence of aortocoronary bypass graft: Secondary | ICD-10-CM | POA: Diagnosis not present

## 2018-11-27 DIAGNOSIS — R1013 Epigastric pain: Secondary | ICD-10-CM | POA: Diagnosis not present

## 2018-11-27 DIAGNOSIS — K219 Gastro-esophageal reflux disease without esophagitis: Secondary | ICD-10-CM | POA: Diagnosis present

## 2018-11-27 DIAGNOSIS — C250 Malignant neoplasm of head of pancreas: Principal | ICD-10-CM | POA: Diagnosis present

## 2018-11-27 DIAGNOSIS — I25701 Atherosclerosis of coronary artery bypass graft(s), unspecified, with angina pectoris with documented spasm: Secondary | ICD-10-CM | POA: Diagnosis present

## 2018-11-27 DIAGNOSIS — Z823 Family history of stroke: Secondary | ICD-10-CM | POA: Diagnosis not present

## 2018-11-27 DIAGNOSIS — J8410 Pulmonary fibrosis, unspecified: Secondary | ICD-10-CM | POA: Diagnosis not present

## 2018-11-27 DIAGNOSIS — R945 Abnormal results of liver function studies: Secondary | ICD-10-CM | POA: Diagnosis not present

## 2018-11-27 DIAGNOSIS — Z833 Family history of diabetes mellitus: Secondary | ICD-10-CM | POA: Diagnosis not present

## 2018-11-27 DIAGNOSIS — E0590 Thyrotoxicosis, unspecified without thyrotoxic crisis or storm: Secondary | ICD-10-CM | POA: Diagnosis present

## 2018-11-27 DIAGNOSIS — Z853 Personal history of malignant neoplasm of breast: Secondary | ICD-10-CM | POA: Diagnosis not present

## 2018-11-27 DIAGNOSIS — Z8719 Personal history of other diseases of the digestive system: Secondary | ICD-10-CM

## 2018-11-27 LAB — CBC WITH DIFFERENTIAL/PLATELET
Abs Immature Granulocytes: 0.03 10*3/uL (ref 0.00–0.07)
Basophils Absolute: 0.1 10*3/uL (ref 0.0–0.1)
Basophils Relative: 1 %
Eosinophils Absolute: 0.3 10*3/uL (ref 0.0–0.5)
Eosinophils Relative: 4 %
HCT: 34.5 % — ABNORMAL LOW (ref 36.0–46.0)
Hemoglobin: 11.7 g/dL — ABNORMAL LOW (ref 12.0–15.0)
Immature Granulocytes: 0 %
Lymphocytes Relative: 32 %
Lymphs Abs: 2.2 10*3/uL (ref 0.7–4.0)
MCH: 30.4 pg (ref 26.0–34.0)
MCHC: 33.9 g/dL (ref 30.0–36.0)
MCV: 89.6 fL (ref 80.0–100.0)
Monocytes Absolute: 0.8 10*3/uL (ref 0.1–1.0)
Monocytes Relative: 11 %
Neutro Abs: 3.5 10*3/uL (ref 1.7–7.7)
Neutrophils Relative %: 52 %
Platelets: 504 10*3/uL — ABNORMAL HIGH (ref 150–400)
RBC: 3.85 MIL/uL — ABNORMAL LOW (ref 3.87–5.11)
RDW: 15.4 % (ref 11.5–15.5)
WBC: 6.8 10*3/uL (ref 4.0–10.5)
nRBC: 0 % (ref 0.0–0.2)

## 2018-11-27 LAB — URINALYSIS, MICROSCOPIC (REFLEX)
Bacteria, UA: NONE SEEN
WBC, UA: NONE SEEN WBC/hpf (ref 0–5)

## 2018-11-27 LAB — COMPREHENSIVE METABOLIC PANEL
ALT: 132 U/L — ABNORMAL HIGH (ref 0–44)
AST: 84 U/L — ABNORMAL HIGH (ref 15–41)
Albumin: 3.8 g/dL (ref 3.5–5.0)
Alkaline Phosphatase: 451 U/L — ABNORMAL HIGH (ref 38–126)
Anion gap: 11 (ref 5–15)
BUN: 11 mg/dL (ref 8–23)
CO2: 26 mmol/L (ref 22–32)
Calcium: 9.5 mg/dL (ref 8.9–10.3)
Chloride: 92 mmol/L — ABNORMAL LOW (ref 98–111)
Creatinine, Ser: 0.43 mg/dL — ABNORMAL LOW (ref 0.44–1.00)
GFR calc Af Amer: >60 mL/min (ref >60)
GFR calc non Af Amer: >60 mL/min (ref >60)
Glucose, Bld: 312 mg/dL — ABNORMAL HIGH (ref 70–99)
Potassium: 3.7 mmol/L (ref 3.5–5.1)
Sodium: 129 mmol/L — ABNORMAL LOW (ref 135–145)
Total Bilirubin: 9.1 mg/dL — ABNORMAL HIGH (ref 0.3–1.2)
Total Protein: 8.1 g/dL (ref 6.5–8.1)

## 2018-11-27 LAB — URINALYSIS, ROUTINE W REFLEX MICROSCOPIC
Glucose, UA: >=500 mg/dL — AB
Ketones, ur: NEGATIVE mg/dL
Leukocytes,Ua: NEGATIVE
Nitrite: NEGATIVE
Protein, ur: NEGATIVE mg/dL
Specific Gravity, Urine: <1.005 — ABNORMAL LOW (ref 1.005–1.030)
pH: 6.5 (ref 5.0–8.0)

## 2018-11-27 LAB — CBG MONITORING, ED: Glucose-Capillary: 308 mg/dL — ABNORMAL HIGH (ref 70–99)

## 2018-11-27 LAB — AMMONIA: Ammonia: 33 umol/L (ref 9–35)

## 2018-11-27 LAB — LIPASE, BLOOD: Lipase: 20 U/L (ref 11–51)

## 2018-11-27 LAB — GLUCOSE, CAPILLARY: Glucose-Capillary: 215 mg/dL — ABNORMAL HIGH (ref 70–99)

## 2018-11-27 MED ORDER — ONDANSETRON HCL 4 MG PO TABS: 4.0000 mg | ORAL_TABLET | Freq: Four times a day (QID) | ORAL | Status: DC | PRN

## 2018-11-27 MED ORDER — INSULIN ASPART 100 UNIT/ML ~~LOC~~ SOLN
0.0000 [IU] | SUBCUTANEOUS | Status: DC
  Administered 2018-11-28: 2 [IU] via SUBCUTANEOUS
  Administered 2018-11-28: 11 [IU] via SUBCUTANEOUS
  Administered 2018-11-28 (×2): 5 [IU] via SUBCUTANEOUS
  Administered 2018-11-29: 3 [IU] via SUBCUTANEOUS
  Administered 2018-11-29 (×2): 11 [IU] via SUBCUTANEOUS
  Administered 2018-11-29: 3 [IU] via SUBCUTANEOUS

## 2018-11-27 MED ORDER — ONDANSETRON HCL 4 MG/2ML IJ SOLN: 4.0000 mg | Freq: Four times a day (QID) | INTRAMUSCULAR | Status: DC | PRN

## 2018-11-27 MED ORDER — IOHEXOL 300 MG/ML  SOLN
80.0000 mL | Freq: Once | INTRAMUSCULAR | Status: AC | PRN
  Administered 2018-11-27: 80 mL via INTRAVENOUS

## 2018-11-27 MED ORDER — IOHEXOL 300 MG/ML  SOLN
30.0000 mL | Freq: Once | INTRAMUSCULAR | Status: AC | PRN
  Administered 2018-11-27: 21:00:00 30 mL via ORAL

## 2018-11-27 MED ORDER — SODIUM CHLORIDE 0.9 % IV SOLN
INTRAVENOUS | Status: DC
  Administered 2018-11-28: 01:00:00 via INTRAVENOUS

## 2018-11-27 MED ORDER — ACETAMINOPHEN 650 MG RE SUPP: 650.0000 mg | Freq: Four times a day (QID) | RECTAL | Status: DC | PRN

## 2018-11-27 MED ORDER — ACETAMINOPHEN 325 MG PO TABS: 650.0000 mg | ORAL_TABLET | Freq: Four times a day (QID) | ORAL | Status: DC | PRN

## 2018-11-27 NOTE — Telephone Encounter (Signed)
See result note. Pt directed to go to [facility].

## 2018-11-27 NOTE — ED Notes (Signed)
Hospitalist called via [place], spoke with Abbe Amsterdam

## 2018-11-27 NOTE — Consult Note (Signed)
    Spoke to Dr. Sherry Ruffing at [facility]  Patient has pancreatic mass and obstructive jaundice w/ pruritus  She will be admitted to [facility] and I will see her tomorrow -   Will need an ERCP and stent but would do a CT scan w/ contrast when she gets here and then do an ERCP after that  Could be tomorrow depending upon time course of things  Would make NPO after 0500  She does not need antibiotics for this  Gatha Mayer, MD, [facility] 11/27/2018 5:33 PM Pager [phone]

## 2018-11-27 NOTE — ED Notes (Signed)
See triage note-pt sent from PCP due to recent lab work levels. States recent itching that began all over body, loss of appetite, & gen unwell feeling since virus on 8/23.  Denies any new meds except increase in carafate by GI doc.

## 2018-11-27 NOTE — H&P (Signed)
History and Physical    [name] TTS:[id number] DOB: 1935/04/19 DOA: 11/27/2018  PCP: Lucille Passy, MD  Patient coming from: Home  I have personally briefly reviewed patient's old medical records in [place]  Chief Complaint: Jaundice  HPI: [name] is a 83 y.o. female with medical history significant of CAD s/p CABG, HTN.  Patient presents to the ED with jaundice, tbili 7.5, generalized puritis.  Patient reports that for the last 2 weeks she has had slowly worsening symptoms of abdominal pain and itching.  She reports that she has been tired and fatigued.  She reports that her daughter thought that she has looked more yellow in color in her eyes and skin for the last few days.  Patient was seen yesterday by her PCP where she had labs showing concern for elevated total bilirubin of 7.5 and elevated liver function.  Ammonia was also elevated at that time.  She reports her abdominal pain is fluctuating and intense.  Gets up to 10 of 10 in severity and is aching.  Is primarily in her upper abdomen and right upper quadrant.  She reports nausea no vomiting.  She reports she always has some bowel troubles but the constipation and diarrhea have been worsening for the last week or 2.  Does have cough + sputum, no fevers, does have chills, no CP, no SOB.   ED Course: T.bili 9.1, ammonia 33, ALK 451, AST 84, alt 132.  CT abd/pelvis reveals CBD obstruction secondary to pancreatic head mass c/w adenocarcinoma of pancreas.  No lymphadenopathy or evidence of metastatic disease.   Review of Systems: As per HPI, otherwise all review of systems negative.  Past Medical History:  Diagnosis Date   Asthma    Atrophic vaginitis    Breast cancer, Left 12/20/2010   NO BLOOD PRESSURE CHECKS OR STICKS IN LEFT ARM   CAD (coronary artery disease)    a. 01/19/2010 s/p CABG x 3, lima->lad, vg->diag, vg->om1;  b. 06/2011 :Lexi MV: EF 84%, No ischemia. c. 01/06/14 s/p negative  nuclear stress test with EF >70%   Cervical arthritis    Colonic polyp 04-27-2009   tubular adenoma   Diverticulosis of colon (without mention of hemorrhage)    Fibromyalgia    Gallstones    GERD (gastroesophageal reflux disease)    Glaucoma    Headache(784.0)    a. frequently assocaited with high BPs   Hiatal hernia    Hypercholesterolemia    Irritable bowel syndrome    Labile hypertension    Lymphedema    left arm   Pinched vertebral nerve     Past Surgical History:  Procedure Laterality Date   78 HOUR [place] STUDY N/A 01/08/2016   Procedure: 24 HOUR [place] STUDY;  Surgeon: Ladene Artist, MD;  Location: [facility];  Service: Endoscopy;  Laterality: N/A;   BREAST LUMPECTOMY Left 02/06/11   CATARACT EXTRACTION, BILATERAL     bilateral caaract removal,   CORONARY ANGIOPLASTY WITH STENT PLACEMENT     Stent 2007   CORONARY ARTERY BYPASS GRAFT     ESOPHAGEAL MANOMETRY N/A 01/08/2016   Procedure: ESOPHAGEAL MANOMETRY (EM);  Surgeon: Ladene Artist, MD;  Location: [facility];  Service: Endoscopy;  Laterality: N/A;   open heart surgery     01/19/2010     reports that she has never smoked. She has never used smokeless tobacco. She reports that she does not drink alcohol or use drugs.  Allergies  Allergen Reactions  Levaquin [Levofloxacin In D5w]     Elevated BP   Choline Fenofibrate Other (See Comments)     pt states INTOL to Trilipix w/ "thigh burning"   Hctz [Hydrochlorothiazide]     Causes hyponatremia   Simvastatin Other (See Comments)     pt states INTOL to STATINS' \\T'$ \ refuses to restart   Adhesive [Tape] Rash   Bentyl [Dicyclomine Hcl]     Made me feel weird and drained me   Ceclor [Cefaclor] Rash   Clarithromycin Rash   Codeine Nausea Only   Doxycycline Rash   Hydrocodone     Nightmare after taking cough syrup w/hydrocodone   Lisinopril Cough    Developed ACE cough...   Penicillins Itching and Rash    At injection site    Tobramycin-Dexamethasone Rash    Family History  Problem Relation Age of Onset   Stroke Mother    Stroke Father    Prostate cancer Father    Breast cancer Sister    Diabetes Sister    Cancer Brother        gland cancer   Heart disease Brother    Colon cancer Neg Hx    Stomach cancer Neg Hx    Pancreatic cancer Neg Hx    Kidney disease Neg Hx    Liver disease Neg Hx      Prior to Admission medications   Medication Sig Start Date End Date Taking? Authorizing Provider  albuterol (VENTOLIN HFA) 108 (90 Base) MCG/ACT inhaler Inhale 2 puffs into the lungs every 6 (six) hours as needed for wheezing or shortness of breath.    [provider]  AMBULATORY NON FORMULARY MEDICATION as needed. FDGARD    [provider]  aspirin EC 81 MG tablet Take 81 mg by mouth daily.    [provider]  CALCIUM PO Take by mouth.    [provider]  EPINEPHrine 0.3 mg/0.3 mL IJ SOAJ injection UAD for severe reaction 04/01/18   [provider]  fexofenadine (ALLEGRA) 180 MG tablet Take 180 mg by mouth daily.      [provider]  Glucosamine-Chondroit-Vit C-Mn (GLUCOSAMINE CHONDR 1500 COMPLX PO) Take 1 tablet by mouth 3 (three) times daily.      [provider]  hydrALAZINE (APRESOLINE) 50 MG tablet Take 0.5 tablets (25 mg total) by mouth 3 (three) times daily. 04/13/18   Lucille Passy, MD  latanoprost (XALATAN) 0.005 % ophthalmic solution Place 1 drop into both eyes at bedtime.  07/31/12   [provider]  losartan (COZAAR) 100 MG tablet TAKE 1/2 TABLET BY MOUTH THREE TIMES A DAY 08/06/18   Lucille Passy, MD  metoprolol tartrate (LOPRESSOR) 50 MG tablet TAKE 1 TABLET BY MOUTH THREE TIMES A DAY 12/23/17   Josue Hector, MD  nitroGLYCERIN (NITROSTAT) 0.4 MG SL tablet Place 1 tablet (0.4 mg total) under the tongue every 5 (five) minutes as needed for chest pain. 04/18/16   Josue Hector, MD  pantoprazole (PROTONIX) 40 MG tablet Take  1 tablet (40 mg total) by mouth 2 (two) times daily. Take 30-60 minutes before breakfast 06/24/18   Ladene Artist, MD  Psyllium (METAMUCIL FIBER PO) Take 1 tablet by mouth daily.     [provider]  rosuvastatin (CRESTOR) 5 MG tablet TAKE 1 TABLET BY MOUTH 3 TIMES A WEEK. (TAKE ON MONDAY, WEDNESDAY, AND FRIDAY) 04/13/18   Lucille Passy, MD  sucralfate (CARAFATE) 1 g tablet TAKE 1 TABLET BY MOUTH  BY MOUTH TWICE DAILY(ONE BEFORE DINNER) 10/27/18   Ladene Artist, MD  traMADol Veatrice Bourbon) 50 MG tablet Take 1/2 to 1 tablet by mouth three times daily as needed 06/19/16   Noralee Space, MD  vitamin B-12 (CYANOCOBALAMIN) 1000 MCG tablet Take 1,000 mcg by mouth daily.      [provider]  VITAMIN D PO Take by mouth.    [provider]    Physical Exam: Vitals:   11/27/18 1833 11/27/18 2130 11/27/18 2200 11/27/18 2319  BP: (!) 175/96  (!) 166/76 (!) 159/76  Pulse: 69 69 70 73  Resp: '15 14 17 17  '$ Temp:    97.8 F (36.6 C)  TempSrc:    Oral  SpO2: 98% 94% 96% 97%    Constitutional: NAD, calm, comfortable Eyes: PERRL, lids and conjunctivae normal ENMT: Mucous membranes are moist. Posterior pharynx clear of any exudate or lesions.Normal dentition.  Neck: normal, supple, no masses, no thyromegaly Respiratory: clear to auscultation bilaterally, no wheezing, no crackles. Normal respiratory effort. No accessory muscle use.  Cardiovascular: Regular rate and rhythm, no murmurs / rubs / gallops. No extremity edema. 2+ pedal pulses. No carotid bruits.  Abdomen: no tenderness, no masses palpated. No hepatosplenomegaly. Bowel sounds positive.  Musculoskeletal: no clubbing / cyanosis. No joint deformity upper and lower extremities. Good ROM, no contractures. Normal muscle tone.  Skin: no rashes, lesions, ulcers. No induration Neurologic: CN 2-12 grossly intact. Sensation intact, DTR normal. Strength 5/5 in all 4.  Psychiatric: Normal judgment and insight. Alert and oriented x 3.  Normal mood.    Labs on Admission: I have personally reviewed following labs and imaging studies  CBC: Recent Labs  Lab 11/26/18 1330 11/27/18 1440  WBC 5.8 6.8  NEUTROABS 2.6 3.5  HGB 10.7* 11.7*  HCT 31.8* 34.5*  MCV 92.0 89.6  PLT 405.0* 923*   Basic Metabolic Panel: Recent Labs  Lab 11/26/18 1330 11/27/18 1440  NA 127* 129*  K 3.8 3.7  CL 91* 92*  CO2 27 26  GLUCOSE 364* 312*  BUN 10 11  CREATININE 0.66 0.43*  CALCIUM 9.5 9.5   GFR: CrCl cannot be calculated (Unknown ideal weight.). Liver Function Tests: Recent Labs  Lab 11/26/18 1330 11/27/18 1440  AST 63* 84*  ALT 114* 132*  ALKPHOS 409* 451*  BILITOT 7.5* 9.1*  PROT 7.4 8.1  ALBUMIN 3.9 3.8   Recent Labs  Lab 11/27/18 1440  LIPASE 20   Recent Labs  Lab 11/26/18 1330 11/27/18 1517  AMMONIA 44* 33   Coagulation Profile: No results for input(s): INR, PROTIME in the last 168 hours. Cardiac Enzymes: No results for input(s): CKTOTAL, CKMB, CKMBINDEX, TROPONINI in the last 168 hours. BNP (last 3 results) No results for input(s): PROBNP in the last 8760 hours. HbA1C: No results for input(s): HGBA1C in the last 72 hours. CBG: Recent Labs  Lab 11/27/18 1440 11/27/18 2343  GLUCAP 308* 215*   Lipid Profile: No results for input(s): CHOL, HDL, LDLCALC, TRIG, CHOLHDL, LDLDIRECT in the last 72 hours. Thyroid Function Tests: No results for input(s): TSH, T4TOTAL, FREET4, T3FREE, THYROIDAB in the last 72 hours. Anemia Panel: No results for input(s): VITAMINB12, FOLATE, FERRITIN, TIBC, IRON, RETICCTPCT in the last 72 hours. Urine analysis:    Component Value Date/Time   COLORURINE YELLOW 11/27/2018 1451   APPEARANCEUR CLEAR 11/27/2018 1451   LABSPEC <1.005 (L) 11/27/2018 1451   PHURINE 6.5 11/27/2018 1451   GLUCOSEU >=500 (A) 11/27/2018 1451   GLUCOSEU >=1000 (A)  11/26/2018 1330   HGBUR TRACE (A) 11/27/2018 1451   BILIRUBINUR SMALL (A) 11/27/2018 1451   BILIRUBINUR neg 11/01/2015 1650    KETONESUR NEGATIVE 11/27/2018 1451   PROTEINUR NEGATIVE 11/27/2018 1451   UROBILINOGEN 0.2 11/26/2018 1330   NITRITE NEGATIVE 11/27/2018 1451   LEUKOCYTESUR NEGATIVE 11/27/2018 1451    Radiological Exams on Admission: Dg Chest 2 View  Result Date: 11/27/2018 CLINICAL DATA:  Chronic cough. EXAM: CHEST - 2 VIEW COMPARISON:  Radiographs of June 19, 2017. FINDINGS: The heart size and mediastinal contours are within normal limits. Both lungs are clear. Status post coronary bypass graft. No pneumothorax or pleural effusion is noted. The visualized skeletal structures are unremarkable. IMPRESSION: No active cardiopulmonary disease. Electronically Signed   By: Marijo Conception M.D.   On: 11/27/2018 16:34   Ct Abdomen Pelvis W Contrast  Result Date: 11/27/2018 CLINICAL DATA:  Abnormal ultrasound, biliary ductal dilatation, assess for pancreatic mass EXAM: CT ABDOMEN AND PELVIS WITH CONTRAST TECHNIQUE: Multidetector CT imaging of the abdomen and pelvis was performed using the standard protocol following bolus administration of intravenous contrast. CONTRAST:  57m OMNIPAQUE IOHEXOL 300 MG/ML SOLN, 364mOMNIPAQUE IOHEXOL 300 MG/ML SOLN, additional oral enteric contrast COMPARISON:  Same day right upper quadrant ultrasound, 11/27/2018, CT abdomen pelvis, 05/04/2014 FINDINGS: Lower chest: No acute abnormality. Calcification within the left breast. Hepatobiliary: No solid liver abnormality is seen. There is gross intrahepatic and extrahepatic biliary ductal dilatation with abrupt cut off of the distal common bile duct in the pancreatic head. Distention of the gallbladder. Pancreas: There is an ill-defined, hypodense mass of the pancreatic head measuring 1.9 x 1.6 x 1.6 cm (series 2, image 26, series 5, image 35). Gross dilatation of the pancreatic duct with abrupt cut off in the pancreatic head at the level of the mass. There may be minimal abutment of the SMV near the portal confluence without definite contact or  encasement (series 2, image 23, series 5, image 34). No abutment or encasement of other adjacent vessels. Spleen: Normal in size without significant abnormality. Adrenals/Urinary Tract: Adrenal glands are unremarkable. Kidneys are normal, without renal calculi, solid lesion, or hydronephrosis. Bladder is unremarkable. Stomach/Bowel: Stomach is within normal limits. Incidental diverticulum of the descending portion of the duodenum. Appendix is very diminutive (series 2, image 5570 No evidence of bowel wall thickening, distention, or inflammatory changes. Sigmoid diverticulosis. Vascular/Lymphatic: Aortic atherosclerosis. No enlarged abdominal or pelvic lymph nodes. Reproductive: No mass or other significant abnormality. The left ovary appears to be surgically absent. Other: No abdominal wall hernia or abnormality. No abdominopelvic ascites. Musculoskeletal: No acute or significant osseous findings. IMPRESSION: 1. There is an ill-defined, hypodense mass of the pancreatic head measuring 1.9 x 1.6 x 1.6 cm (series 2, image 26, series 5, image 35) with gross biliary and pancreatic ductal dilatation. Findings are consistent with pancreatic adenocarcinoma. There may be minimal abutment of the SMV near the portal confluence without definite contact or encasement (series 2, image 23, series 5, image 34). No abutment or encasement of other adjacent vessels. Pancreatic protocol MRI may be helpful to better assess for resectability if desired. 2.  No evidence of abdominal lymphadenopathy or metastatic disease. 3. Other chronic and incidental findings as detailed above. Aortic Atherosclerosis (ICD10-I70.0). Electronically Signed   By: AlEddie Candle.D.   On: 11/27/2018 21:06   UsKoreabdomen Limited Ruq  Result Date: 11/27/2018 CLINICAL DATA:  History of gallstones, jaundice, upper abdominal pain, nausea. EXAM: ULTRASOUND ABDOMEN LIMITED RIGHT UPPER QUADRANT COMPARISON:  Abdominal ultrasound 12/08/2015 FINDINGS: Gallbladder: The  gallbladder is distended. There is a mild amount of sludge. No gallbladder wall thickening. No sonographic Murphy sign noted by sonographer. Common bile duct: Diameter: 1.3 cm Liver: No focal lesion identified. Within normal limits in parenchymal echogenicity. Portal vein is patent on color Doppler imaging with normal direction of blood flow towards the liver. There is intrahepatic biliary duct dilation. Other: There is suggestion of a mass at the head of the pancreas measuring 2.6 x 2.8 by 3.1 cm. The main pancreatic duct is dilated measuring 1.1 cm. IMPRESSION: 1.  Intra and extrahepatic biliary duct dilation. 2. Suggestion of a mass at the head of the pancreas as well as dilation of the main pancreatic duct. Further evaluation with cross-sectional imaging is recommended. 3. Distended gallbladder with a mild amount of sludge. No significant inflammatory changes. Electronically Signed   By: Audie Pinto M.D.   On: 11/27/2018 16:33    EKG: Independently reviewed.  Assessment/Plan Principal Problem:   Pancreatic mass Active Problems:   CAD (coronary artery disease)   OSA on CPAP   HTN (hypertension)   Obstructive jaundice due to malignant neoplasm ([place])    1. Pancreatic mass with obstructive jaundice - 1. See Dr. Celesta Aver note 2. NPO after 5AM 3. IVF: NS at 75 cc/hr 4. ERCP w/ stent planned 5. No metastatic dz on CT 6. Checking CA19-9 7. Likely needs gen surg consult in AM re: Whipple candidate? 2. CAD - 1. Cont home statin + BP meds 2. Holding ASA 3. HTN - 1. Continue home BP meds 4. OSA - continue CPAP QHS 5. DM2 - apparent new diagnosis 1. SSI Q4H sensitive scale 2. Checking A1C  DVT prophylaxis: SCDs Code Status: Full Family Communication: No family in room Disposition Plan: Home after admit Consults called: None Admission status: Admit to inpatient  Severity of Illness: The appropriate patient status for this patient is INPATIENT. Inpatient status is judged to be  reasonable and necessary in order to provide the required intensity of service to ensure the patient's safety. The patient's presenting symptoms, physical exam findings, and initial radiographic and laboratory data in the context of their chronic comorbidities is felt to place them at high risk for further clinical deterioration. Furthermore, it is not anticipated that the patient will be medically stable for discharge from the hospital within 2 midnights of admission. The following factors support the patient status of inpatient.   IP status for obstructive jaundice requiring ERCP and stent.  Also has apparent pancreatic adenocarcinoma, new diagnosis.  * I certify that at the point of admission it is my clinical judgment that the patient will require inpatient hospital care spanning beyond 2 midnights from the point of admission due to high intensity of service, high risk for further deterioration and high frequency of surveillance required.*    Devinn Hurwitz M. DO [facility]  How to contact the [facility] Attending or Consulting provider [place] or covering provider during after hours [place], for this patient?  1. Check the care team in [facility] and look for a) attending/consulting [facility] provider listed and b) the [facility] team listed 2. Log into [email or web address]  Amion Physician Scheduling and messaging for groups and whole hospitals  On call and physician scheduling software for group practices, residents, hospitalists and other medical providers for call, clinic, rotation and shift schedules. OnCall Enterprise is a hospital-wide system for scheduling doctors and paging doctors on call. EasyPlot is for scientific plotting and data analysis.  [email or web address]  and use [facility]'s universal password to access. If you do not have the password, please contact the hospital operator.  3. Locate the [facility] provider you are looking for under [facility] and page to a number that you can be directly  reached. 4. If you still have difficulty reaching the provider, please page the [facility] (Director on Call) for the Hospitalists listed on amion for assistance.  11/27/2018, 11:52 PM

## 2018-11-27 NOTE — ED Notes (Signed)
Patient has ready bed at [facility] and [facility] called to get patient on the list for transport.

## 2018-11-27 NOTE — Telephone Encounter (Signed)
During this illness, did/does the patient experience any of the following symptoms? Fever >100.4F []  Yes [x]  No []  Unknown Subjective fever (felt feverish) []  Yes [x]  No []  Unknown Chills []  Yes [x]  No []  Unknown Muscle aches (myalgia) []  Yes [x]  No []  Unknown Runny nose (rhinorrhea) []  Yes [x]  No []  Unknown Sore throat []  Yes [x]  No []  Unknown Cough (new onset or worsening of chronic cough) []  Yes [x]  No []  Unknown Shortness of breath (dyspnea) []  Yes [x]  No []  Unknown Nausea or vomiting []  Yes [x]  No []  Unknown Headache []  Yes [x]  No []  Unknown Abdominal pain  []  Yes [x]  No []  Unknown Diarrhea (?3 loose/looser than normal stools/24hr period) []  Yes [x]  No []  Unknown Other, specify:  

## 2018-11-27 NOTE — ED Triage Notes (Addendum)
PT was sent from he MD at [facility] due to abnormal lab work yesterday. She has elevated ammonia levels, elevated glucose and elevated liver enzymes. Her Linus Orn test was negative. She endorses just feeling out of sorts and having losts of itching everywhere, especially her feet and hands.

## 2018-11-27 NOTE — Telephone Encounter (Signed)
Patient calling and states that she seen Dr Deborra Medina yesterday (11/26/2018) regarding the itching that she is having. States that she has taken benadryl and it does not seem to help. States that the itching is all over now, not just in her hands and legs like yesterday. Would like to know what she should do? [facility] RE:[id number] [address] [place], [place]  CB#: [phone]

## 2018-11-27 NOTE — ED Provider Notes (Signed)
[facility]ERGENC[facility] DEPARTMENT Provider Note   CSN: [id number] Arrival date & time: 11/27/18  1419     History   Chief Complaint Chief Complaint  Patient presents with   Abnormal Lab    HPI [name] is a 83 y.o. female.     The history is provided by the patient and medical records. No language interpreter was used.  Abdominal Pain Pain location:  RUQ and epigastric Pain quality: aching   Pain radiates to:  Does not radiate Pain severity:  Severe Onset quality:  Gradual Duration:  2 weeks Timing:  Intermittent Progression:  Waxing and waning Chronicity:  New Context: not previous surgeries   Relieved by:  Nothing Worsened by:  Nothing Ineffective treatments:  None tried Associated symptoms: chills, constipation, cough, diarrhea, fatigue and nausea   Associated symptoms: no chest pain, no dysuria, no fever, no shortness of breath and no vomiting   Risk factors: being elderly     Past Medical History:  Diagnosis Date   Asthma    Atrophic vaginitis    Breast cancer, Left 12/20/2010   NO BLOOD PRESSURE CHECKS OR STICKS IN LEFT ARM   CAD (coronary artery disease)    a. 01/19/2010 s/p CABG x 3, lima->lad, vg->diag, vg->om1;  b. 06/2011 :Lexi MV: EF 84%, No ischemia. c. 01/06/14 s/p negative nuclear stress test with EF >70%   Cervical arthritis    Colonic polyp 04-27-2009   tubular adenoma   Diverticulosis of colon (without mention of hemorrhage)    Fibromyalgia    Gallstones    GERD (gastroesophageal reflux disease)    Glaucoma    Headache(784.0)    a. frequently assocaited with high BPs   Hiatal hernia    Hypercholesterolemia    Irritable bowel syndrome    Labile hypertension    Lymphedema    left arm   Pinched vertebral nerve     Patient Active Problem List   Diagnosis Date Noted   Itching 11/26/2018   Suspected COVID-19 virus infection 11/21/2018   HTN (hypertension) 04/12/2018   Lumbar radiculopathy  04/12/2018   Degeneration of lumbar intervertebral disc 12/16/2017   Spinal stenosis of lumbar region 12/16/2017   Osteoarthritis of hip 09/25/2017   Low back pain 09/25/2017   S/P CABG (coronary artery bypass graft) 12/19/2016   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   Heartburn 05/23/2015   OSA on CPAP 11/23/2014   Generalized anxiety disorder 02/04/2014   Labile hypertension    Fibromyalgia    Lumbar adjacent segment disease with spondylolisthesis 12/24/2013   CAD (coronary artery disease) 12/24/2013   Bronchitis, chronic obstructive w acute bronchitis ([place]) 12/21/2013   Diverticulosis of colon without hemorrhage 11/15/2013   Hot flashes related to aromatase inhibitor therapy 07/27/2013   Osteopenia 07/27/2013   DJD (degenerative joint disease) 09/29/2012   Breast cancer, Left 12/20/2010   FATTY LIVER DISEASE 01/09/2010   Allergic rhinitis 02/18/2007   DIZZINESS, CHRONIC 02/18/2007   Headache in back of head 02/18/2007   Mixed hyperlipidemia 12/23/2006   GLAUCOMA 12/23/2006   Coronary atherosclerosis 12/23/2006   Asthma 12/23/2006   Gastroesophageal reflux disease 12/23/2006   IRRITABLE BOWEL SYNDROME 12/23/2006   Myalgia and myositis 12/23/2006    Past Surgical History:  Procedure Laterality Date   24 HOUR [place] STUDY N/A 01/08/2016   Procedure: 24 HOUR PH STUDY;  Surgeon: Ladene Artist, MD;  Location: [facility];  Service: Endoscopy;  Laterality: N/A;   BREAST LUMPECTOMY Left 02/06/11   CATARACT EXTRACTION, BILATERAL  bilateral caaract removal,   CORONARY ANGIOPLASTY WITH STENT PLACEMENT     Stent 2007   CORONARY ARTERY BYPASS GRAFT     ESOPHAGEAL MANOMETRY N/A 01/08/2016   Procedure: ESOPHAGEAL MANOMETRY (EM);  Surgeon: Ladene Artist, MD;  Location: [facility];  Service: Endoscopy;  Laterality: N/A;   open heart surgery     01/19/2010     OB History    Gravida  3   Para  2   Term  2   Preterm  0   AB  1    Living  2     SAB  0   TAB  0   Ectopic  0   Multiple  0   Live Births  2            Home Medications    Prior to Admission medications   Medication Sig Start Date End Date Taking? Authorizing Provider  albuterol (VENTOLIN HFA) 108 (90 Base) MCG/ACT inhaler Inhale 2 puffs into the lungs every 6 (six) hours as needed for wheezing or shortness of breath.    [provider]  AMBULATORY NON FORMULARY MEDICATION as needed. FDGARD    [provider]  aspirin EC 81 MG tablet Take 81 mg by mouth daily.    [provider]  CALCIUM PO Take by mouth.    [provider]  EPINEPHrine 0.3 mg/0.3 mL IJ SOAJ injection UAD for severe reaction 04/01/18   [provider]  fexofenadine (ALLEGRA) 180 MG tablet Take 180 mg by mouth daily.      [provider]  Glucosamine-Chondroit-Vit C-Mn (GLUCOSAMINE CHONDR 1500 COMPLX PO) Take 1 tablet by mouth 3 (three) times daily.      [provider]  hydrALAZINE (APRESOLINE) 50 MG tablet Take 0.5 tablets (25 mg total) by mouth 3 (three) times daily. 04/13/18   Lucille Passy, MD  latanoprost (XALATAN) 0.005 % ophthalmic solution Place 1 drop into both eyes at bedtime.  07/31/12   [provider]  losartan (COZAAR) 100 MG tablet TAKE 1/2 TABLET BY MOUTH THREE TIMES A DAY 08/06/18   Lucille Passy, MD  metoprolol tartrate (LOPRESSOR) 50 MG tablet TAKE 1 TABLET BY MOUTH THREE TIMES A DAY 12/23/17   Josue Hector, MD  nitroGLYCERIN (NITROSTAT) 0.4 MG SL tablet Place 1 tablet (0.4 mg total) under the tongue every 5 (five) minutes as needed for chest pain. 04/18/16   Josue Hector, MD  pantoprazole (PROTONIX) 40 MG tablet Take 1 tablet (40 mg total) by mouth 2 (two) times daily. Take 30-60 minutes before breakfast 06/24/18   Ladene Artist, MD  Psyllium (METAMUCIL FIBER PO) Take 1 tablet by mouth daily.     [provider]  rosuvastatin (CRESTOR) 5 MG tablet TAKE 1 TABLET BY MOUTH 3 TIMES  A WEEK. (TAKE ON MONDAY, WEDNESDAY, AND FRIDAY) 04/13/18   Lucille Passy, MD  sucralfate (CARAFATE) 1 g tablet TAKE 1 TABLET BY MOUTH BY MOUTH TWICE DAILY(ONE BEFORE DINNER) 10/27/18   Ladene Artist, MD  traMADol Veatrice Bourbon) 50 MG tablet Take 1/2 to 1 tablet by mouth three times daily as needed 06/19/16   Noralee Space, MD  vitamin B-12 (CYANOCOBALAMIN) 1000 MCG tablet Take 1,000 mcg by mouth daily.      [provider]  VITAMIN D PO Take by mouth.    [provider]    Family History Family History  Problem Relation Age of Onset   Stroke Mother  Stroke Father    Prostate cancer Father    Breast cancer Sister    Diabetes Sister    Cancer Brother        gland cancer   Heart disease Brother    Colon cancer Neg Hx    Stomach cancer Neg Hx    Pancreatic cancer Neg Hx    Kidney disease Neg Hx    Liver disease Neg Hx     Social History Social History   Tobacco Use   Smoking status: Never Smoker   Smokeless tobacco: Never Used  Substance Use Topics   Alcohol use: No    Alcohol/week: 0.0 standard drinks   Drug use: No     Allergies   Levaquin [levofloxacin in d5w], Choline fenofibrate, Hctz [hydrochlorothiazide], Simvastatin, Adhesive [tape], Bentyl [dicyclomine hcl], Ceclor [cefaclor], Clarithromycin, Codeine, Doxycycline, Hydrocodone, Lisinopril, Penicillins, and Tobramycin-dexamethasone   Review of Systems Review of Systems  Constitutional: Positive for chills and fatigue. Negative for diaphoresis and fever.  HENT: Positive for congestion.   Eyes: Negative for visual disturbance.  Respiratory: Positive for cough. Negative for chest tightness, shortness of breath and wheezing.   Cardiovascular: Negative for chest pain, palpitations and leg swelling.  Gastrointestinal: Positive for abdominal pain, constipation, diarrhea and nausea. Negative for abdominal distention and vomiting.  Genitourinary: Positive for urgency. Negative for dysuria,  flank pain and frequency.  Musculoskeletal: Negative for back pain, neck pain and neck stiffness.  Skin: Negative for rash and wound.  Neurological: Negative for seizures, weakness, light-headedness, numbness and headaches.  Psychiatric/Behavioral: Negative for agitation.  All other systems reviewed and are negative.    Physical Exam Updated Vital Signs BP (!) 161/133    Pulse 66    Temp 98 F (36.7 C) (Oral)    Resp 18    LMP 11/26/1990    SpO2 97%   Physical Exam Vitals signs and nursing note reviewed.  Constitutional:      General: She is not in acute distress.    Appearance: Normal appearance. She is well-developed. She is not ill-appearing, toxic-appearing or diaphoretic.  HENT:     Head: Normocephalic and atraumatic.     Right Ear: External ear normal.     Left Ear: External ear normal.     Nose: Nose normal.     Mouth/Throat:     Mouth: Mucous membranes are moist.     Pharynx: No oropharyngeal exudate or posterior oropharyngeal erythema.     Comments: Jaundiced  Eyes:     General: Scleral icterus present.     Extraocular Movements: Extraocular movements intact.     Pupils: Pupils are equal, round, and reactive to light.  Neck:     Musculoskeletal: Normal range of motion and neck supple. No muscular tenderness.  Cardiovascular:     Rate and Rhythm: Normal rate.     Pulses: Normal pulses.     Heart sounds: No murmur.  Pulmonary:     Effort: Pulmonary effort is normal. No respiratory distress.     Breath sounds: No stridor. No wheezing, rhonchi or rales.  Chest:     Chest wall: No tenderness.  Abdominal:     General: Abdomen is flat. There is no distension.     Tenderness: There is abdominal tenderness. There is no right CVA tenderness, left CVA tenderness or rebound.  Musculoskeletal:        General: No tenderness or signs of injury.  Skin:    General: Skin is warm.     Capillary Refill:  Capillary refill takes less than 2 seconds.     Findings: No erythema or  rash.  Neurological:     General: No focal deficit present.     Mental Status: She is alert and oriented to person, place, and time.     Motor: No abnormal muscle tone.     Deep Tendon Reflexes: Reflexes are normal and symmetric.  Psychiatric:        Mood and Affect: Mood normal.      ED Treatments / Results  Labs (all labs ordered are listed, but only abnormal results are displayed) Labs Reviewed  CBC WITH DIFFERENTIAL/PLATELET - Abnormal; Notable for the following components:      Result Value   RBC 3.85 (*)    Hemoglobin 11.7 (*)    HCT 34.5 (*)    Platelets 504 (*)    All other components within normal limits  COMPREHENSIVE METABOLIC PANEL - Abnormal; Notable for the following components:   Sodium 129 (*)    Chloride 92 (*)    Glucose, Bld 312 (*)    Creatinine, Ser 0.43 (*)    AST 84 (*)    ALT 132 (*)    Alkaline Phosphatase 451 (*)    Total Bilirubin 9.1 (*)    All other components within normal limits  URINALYSIS, ROUTINE W REFLEX MICROSCOPIC - Abnormal; Notable for the following components:   Specific Gravity, Urine <1.005 (*)    Glucose, UA >=500 (*)    Hgb urine dipstick TRACE (*)    Bilirubin Urine SMALL (*)    All other components within normal limits  GLUCOSE, CAPILLARY - Abnormal; Notable for the following components:   Glucose-Capillary 215 (*)    All other components within normal limits  CBG MONITORING, ED - Abnormal; Notable for the following components:   Glucose-Capillary 308 (*)    All other components within normal limits  URINE CULTURE  SARS CORONAVIRUS 2 (TAT 6-24 HRS)  LIPASE, BLOOD  AMMONIA  URINALYSIS, MICROSCOPIC (REFLEX)  HEMOGLOBIN A1C  COMPREHENSIVE METABOLIC PANEL  CANCER ANTIGEN 19-9    EKG None  Radiology Dg Chest 2 View  Result Date: 11/27/2018 CLINICAL DATA:  Chronic cough. EXAM: CHEST - 2 VIEW COMPARISON:  Radiographs of June 19, 2017. FINDINGS: The heart size and mediastinal contours are within normal limits. Both  lungs are clear. Status post coronary bypass graft. No pneumothorax or pleural effusion is noted. The visualized skeletal structures are unremarkable. IMPRESSION: No active cardiopulmonary disease. Electronically Signed   By: Marijo Conception M.D.   On: 11/27/2018 16:34   Ct Abdomen Pelvis W Contrast  Result Date: 11/27/2018 CLINICAL DATA:  Abnormal ultrasound, biliary ductal dilatation, assess for pancreatic mass EXAM: CT ABDOMEN AND PELVIS WITH CONTRAST TECHNIQUE: Multidetector CT imaging of the abdomen and pelvis was performed using the standard protocol following bolus administration of intravenous contrast. CONTRAST:  38m OMNIPAQUE IOHEXOL 300 MG/ML SOLN, 31mOMNIPAQUE IOHEXOL 300 MG/ML SOLN, additional oral enteric contrast COMPARISON:  Same day right upper quadrant ultrasound, 11/27/2018, CT abdomen pelvis, 05/04/2014 FINDINGS: Lower chest: No acute abnormality. Calcification within the left breast. Hepatobiliary: No solid liver abnormality is seen. There is gross intrahepatic and extrahepatic biliary ductal dilatation with abrupt cut off of the distal common bile duct in the pancreatic head. Distention of the gallbladder. Pancreas: There is an ill-defined, hypodense mass of the pancreatic head measuring 1.9 x 1.6 x 1.6 cm (series 2, image 26, series 5, image 35). Gross dilatation of the pancreatic duct with  abrupt cut off in the pancreatic head at the level of the mass. There may be minimal abutment of the SMV near the portal confluence without definite contact or encasement (series 2, image 23, series 5, image 34). No abutment or encasement of other adjacent vessels. Spleen: Normal in size without significant abnormality. Adrenals/Urinary Tract: Adrenal glands are unremarkable. Kidneys are normal, without renal calculi, solid lesion, or hydronephrosis. Bladder is unremarkable. Stomach/Bowel: Stomach is within normal limits. Incidental diverticulum of the descending portion of the duodenum. Appendix is  very diminutive (series 2, image 56). No evidence of bowel wall thickening, distention, or inflammatory changes. Sigmoid diverticulosis. Vascular/Lymphatic: Aortic atherosclerosis. No enlarged abdominal or pelvic lymph nodes. Reproductive: No mass or other significant abnormality. The left ovary appears to be surgically absent. Other: No abdominal wall hernia or abnormality. No abdominopelvic ascites. Musculoskeletal: No acute or significant osseous findings. IMPRESSION: 1. There is an ill-defined, hypodense mass of the pancreatic head measuring 1.9 x 1.6 x 1.6 cm (series 2, image 26, series 5, image 35) with gross biliary and pancreatic ductal dilatation. Findings are consistent with pancreatic adenocarcinoma. There may be minimal abutment of the SMV near the portal confluence without definite contact or encasement (series 2, image 23, series 5, image 34). No abutment or encasement of other adjacent vessels. Pancreatic protocol MRI may be helpful to better assess for resectability if desired. 2.  No evidence of abdominal lymphadenopathy or metastatic disease. 3. Other chronic and incidental findings as detailed above. Aortic Atherosclerosis (ICD10-I70.0). Electronically Signed   By: Eddie Candle M.D.   On: 11/27/2018 21:06   US Abdomen Limited Ruq  Result Date: 11/27/2018 CLINICAL DATA:  History of gallstones, jaundice, upper abdominal pain, nausea. EXAM: ULTRASOUND ABDOMEN LIMITED RIGHT UPPER QUADRANT COMPARISON:  Abdominal ultrasound 12/08/2015 FINDINGS: Gallbladder: The gallbladder is distended. There is a mild amount of sludge. No gallbladder wall thickening. No sonographic Murphy sign noted by sonographer. Common bile duct: Diameter: 1.3 cm Liver: No focal lesion identified. Within normal limits in parenchymal echogenicity. Portal vein is patent on color Doppler imaging with normal direction of blood flow towards the liver. There is intrahepatic biliary duct dilation. Other: There is suggestion of a mass  at the head of the pancreas measuring 2.6 x 2.8 by 3.1 cm. The main pancreatic duct is dilated measuring 1.1 cm. IMPRESSION: 1.  Intra and extrahepatic biliary duct dilation. 2. Suggestion of a mass at the head of the pancreas as well as dilation of the main pancreatic duct. Further evaluation with cross-sectional imaging is recommended. 3. Distended gallbladder with a mild amount of sludge. No significant inflammatory changes. Electronically Signed   By: Audie Pinto M.D.   On: 11/27/2018 16:33    Procedures Procedures (including critical care time)  Medications Ordered in ED Medications  insulin aspart (novoLOG) injection 0-15 Units (has no administration in time range)  0.9 %  sodium chloride infusion (has no administration in time range)  acetaminophen (TYLENOL) tablet 650 mg (has no administration in time range)    Or  acetaminophen (TYLENOL) suppository 650 mg (has no administration in time range)  ondansetron (ZOFRAN) tablet 4 mg (has no administration in time range)    Or  ondansetron (ZOFRAN) injection 4 mg (has no administration in time range)  morphine 2 MG/ML injection 2-4 mg (has no administration in time range)  iohexol (OMNIPAQUE) 300 MG/ML solution 80 mL (80 mLs Intravenous Contrast Given 11/27/18 2032)  iohexol (OMNIPAQUE) 300 MG/ML solution 30 mL (30 mLs Oral Contrast Given  11/27/18 2033)     Initial Impression / Assessment and Plan / ED Course  I have reviewed the triage vital signs and the nursing notes.  Pertinent labs & imaging results that were available during my care of the patient were reviewed by me and considered in my medical decision making (see chart for details).        [name] is an extremely pleasant 83 y.o. female with a past medical history significant for hyperlipidemia, irritable bowel syndrome, GERD, asthma, diverticulosis, hypertension, CAD status post CABG, fibromyalgia, anxiety, and prior gallstones who presents with abnormal lab,  intense pruritus, fatigue, malaise, nausea, fluctuating constipation/diarrhea, and upper abdominal pain.  Patient reports that for the last 2 weeks she has had slowly worsening symptoms of abdominal pain and itching.  She reports that she has been tired and fatigued.  She reports that her daughter thought that she has looked more yellow in color in her eyes and skin for the last few days.  Patient was seen yesterday by her PCP where she had labs showing concern for elevated total bilirubin of 7.5 and elevated liver function.  Ammonia was also elevated at that time.  Patient denies any rashes or any recent injuries.  She reports her abdominal pain is fluctuating and intense.  Gets up to 10 of 10 in severity and is aching.  Is primarily in her upper abdomen and right upper quadrant.  She reports nausea no vomiting.  She reports she always has some bowel troubles but the constipation and diarrhea have been worsening for the last week or 2.  She reports has had urinary urgency but denies dysuria.  She reports a productive cough with yellow sputum.  She denies fevers but does report some chills.  She denies any chest pain or shortness of breath.  On exam, upper abdomen is tender to palpation.  No guarding or rebound tenderness.  Bowel sounds are present.  Lungs were clear and chest was nontender.  Back and flanks nontender.  No CVA tenderness.  Patient does have scleral icterus and jaundice in her posterior oropharynx.  Vital signs showed hypertension on arrival.  Chart review shows that patient did have elevated LFTs, alk phos, and bilirubin yesterday.  Will repeat labs as well as ammonia.  Will check urine due to the urinary symptoms.  Will get chest x-ray due to the cough.  Will get right upper quadrant ultrasound to look for obstructive pathology as given her abdominal discomfort I suspect she has a gallstone causing her symptoms.  Anticipate touching base with gastroenterology if there is no cholecystitis or  general surgery if cholecystitis is found.  With her fatigue and malaise with the symptoms worsening over the last few days, anticipate patient will require admission.  Patient does not want pain and nausea medicine initially.  5:47 PM Work-up unfortunately began to return and is suggestive of a pancreatic cancer causing her symptoms.  Ultrasound shows sludge but no evidence of acute cholecystitis.  The intra-and extrahepatic biliary ducts are dilated.  Patient's labs show that bilirubin has increased from yesterday up from 7.5 up to 9.1.  AST, ALT, and alk phos are also elevated.  CBC shows mild anemia but no leukocytosis.  Urinalysis does not show infection.  Ammonia not elevated.  Chest x-ray unremarkable.  Spoke with Dr. Carlean Purl with [facility] who recommended she be admitted to the hospital service at [facility] and they will see her in the morning.  He is concerned she  has pancreatic cancer causing acute obstruction of the biliary duct.  He thinks she needs an MRI overnight.  He also requested she get a CT scan with contrast both oral and IV contrast while in the emergency department waiting her bed.  Patient is agreeable this plan and will be admitted.    Final Clinical Impressions(s) / ED Diagnoses   Final diagnoses:  RUQ abdominal pain  Hyperbilirubinemia  Dilated bile duct  Epigastric pain  LFT elevation    ED Discharge Orders    None      Clinical Impression: 1. Hyperbilirubinemia   2. RUQ abdominal pain   3. Dilated bile duct   4. Epigastric pain   5. LFT elevation     Disposition: Admit  This note was prepared with assistance of Dragon voice recognition software. Occasional wrong-word or sound-a-like substitutions may have occurred due to the inherent limitations of voice recognition software.     Wajiha Versteeg, Gwenyth Allegra, MD 11/28/18 [phone]

## 2018-11-28 ENCOUNTER — Inpatient Hospital Stay (HOSPITAL_COMMUNITY): Payer: Medicare Other

## 2018-11-28 ENCOUNTER — Encounter (HOSPITAL_COMMUNITY): Payer: Self-pay | Admitting: Internal Medicine

## 2018-11-28 ENCOUNTER — Inpatient Hospital Stay (HOSPITAL_COMMUNITY): Payer: Medicare Other | Admitting: Anesthesiology

## 2018-11-28 ENCOUNTER — Encounter (HOSPITAL_COMMUNITY)
Admission: EM | Disposition: A | Discharge status: Home / Self Care | Payer: Self-pay | Source: Home / Self Care | Attending: Internal Medicine

## 2018-11-28 DIAGNOSIS — D490 Neoplasm of unspecified behavior of digestive system: Secondary | ICD-10-CM

## 2018-11-28 HISTORY — PX: BILIARY STENT PLACEMENT: SHX5538

## 2018-11-28 HISTORY — PX: BILIARY BRUSHING: SHX6843

## 2018-11-28 HISTORY — PX: ERCP: SHX5425

## 2018-11-28 LAB — BASIC METABOLIC PANEL
Anion gap: 12 (ref 5–15)
Anion gap: 14 (ref 5–15)
BUN: 8 mg/dL (ref 8–23)
BUN: 5 mg/dL — ABNORMAL LOW (ref 8–23)
CO2: 22 mmol/L (ref 22–32)
CO2: 20 mmol/L — ABNORMAL LOW (ref 22–32)
Calcium: 8.7 mg/dL — ABNORMAL LOW (ref 8.9–10.3)
Calcium: 8.8 mg/dL — ABNORMAL LOW (ref 8.9–10.3)
Chloride: 94 mmol/L — ABNORMAL LOW (ref 98–111)
Chloride: 96 mmol/L — ABNORMAL LOW (ref 98–111)
Creatinine, Ser: 0.54 mg/dL (ref 0.44–1.00)
Creatinine, Ser: 0.56 mg/dL (ref 0.44–1.00)
GFR calc Af Amer: >60 mL/min (ref >60)
GFR calc Af Amer: >60 mL/min (ref >60)
GFR calc non Af Amer: >60 mL/min (ref >60)
GFR calc non Af Amer: >60 mL/min (ref >60)
Glucose, Bld: 207 mg/dL — ABNORMAL HIGH (ref 70–99)
Glucose, Bld: 137 mg/dL — ABNORMAL HIGH (ref 70–99)
Potassium: 3.4 mmol/L — ABNORMAL LOW (ref 3.5–5.1)
Potassium: 3.2 mmol/L — ABNORMAL LOW (ref 3.5–5.1)
Sodium: 128 mmol/L — ABNORMAL LOW (ref 135–145)
Sodium: 130 mmol/L — ABNORMAL LOW (ref 135–145)

## 2018-11-28 LAB — HEMOGLOBIN A1C
Hgb A1c MFr Bld: 7.5 % — ABNORMAL HIGH (ref 4.8–5.6)
Mean Plasma Glucose: 168.55 mg/dL

## 2018-11-28 LAB — MAGNESIUM: Magnesium: 1.8 mg/dL (ref 1.7–2.4)

## 2018-11-28 LAB — GLUCOSE, CAPILLARY
Glucose-Capillary: 219 mg/dL — ABNORMAL HIGH (ref 70–99)
Glucose-Capillary: 136 mg/dL — ABNORMAL HIGH (ref 70–99)
Glucose-Capillary: 116 mg/dL — ABNORMAL HIGH (ref 70–99)
Glucose-Capillary: 179 mg/dL — ABNORMAL HIGH (ref 70–99)
Glucose-Capillary: 303 mg/dL — ABNORMAL HIGH (ref 70–99)

## 2018-11-28 LAB — COMPREHENSIVE METABOLIC PANEL
ALT: 122 U/L — ABNORMAL HIGH (ref 0–44)
AST: 94 U/L — ABNORMAL HIGH (ref 15–41)
Albumin: 3.3 g/dL — ABNORMAL LOW (ref 3.5–5.0)
Alkaline Phosphatase: 429 U/L — ABNORMAL HIGH (ref 38–126)
Anion gap: 11 (ref 5–15)
BUN: 8 mg/dL (ref 8–23)
CO2: 25 mmol/L (ref 22–32)
Calcium: 9.1 mg/dL (ref 8.9–10.3)
Chloride: 94 mmol/L — ABNORMAL LOW (ref 98–111)
Creatinine, Ser: 0.5 mg/dL (ref 0.44–1.00)
GFR calc Af Amer: >60 mL/min (ref >60)
GFR calc non Af Amer: >60 mL/min (ref >60)
Glucose, Bld: 252 mg/dL — ABNORMAL HIGH (ref 70–99)
Potassium: 5.9 mmol/L — ABNORMAL HIGH (ref 3.5–5.1)
Sodium: 130 mmol/L — ABNORMAL LOW (ref 135–145)
Total Bilirubin: 12.1 mg/dL — ABNORMAL HIGH (ref 0.3–1.2)
Total Protein: 7.2 g/dL (ref 6.5–8.1)

## 2018-11-28 LAB — SARS CORONAVIRUS 2 (TAT 6-24 HRS): SARS Coronavirus 2: NEGATIVE

## 2018-11-28 LAB — OSMOLALITY, URINE: Osmolality, Ur: 608 mOsm/kg (ref 300–900)

## 2018-11-28 LAB — OSMOLALITY: Osmolality: 272 mOsm/kg — ABNORMAL LOW (ref 275–295)

## 2018-11-28 LAB — SODIUM, URINE, RANDOM: Sodium, Ur: 73 mmol/L

## 2018-11-28 LAB — PHOSPHORUS: Phosphorus: 3.9 mg/dL (ref 2.5–4.6)

## 2018-11-28 SURGERY — ERCP, WITH INTERVENTION IF INDICATED
Anesthesia: General

## 2018-11-28 MED ORDER — INDOMETHACIN 50 MG RE SUPP: 100.0000 mg | Freq: Once | RECTAL | Status: AC

## 2018-11-28 MED ORDER — PHENYLEPHRINE HCL (PRESSORS) 10 MG/ML IV SOLN
INTRAVENOUS | Status: DC | PRN
  Administered 2018-11-28: 40 ug via INTRAVENOUS

## 2018-11-28 MED ORDER — ONDANSETRON HCL 4 MG/2ML IJ SOLN
INTRAMUSCULAR | Status: DC | PRN
  Administered 2018-11-28: 4 mg via INTRAVENOUS

## 2018-11-28 MED ORDER — SUCCINYLCHOLINE CHLORIDE 200 MG/10ML IV SOSY
PREFILLED_SYRINGE | INTRAVENOUS | Status: DC | PRN
  Administered 2018-11-28: 100 mg via INTRAVENOUS

## 2018-11-28 MED ORDER — DIPHENHYDRAMINE HCL 25 MG PO CAPS
25.0000 mg | ORAL_CAPSULE | Freq: Once | ORAL | Status: AC
  Administered 2018-11-28: 25 mg via ORAL
  Filled 2018-11-28: qty 1

## 2018-11-28 MED ORDER — INDOMETHACIN 50 MG RE SUPP
RECTAL | Status: DC | PRN
  Administered 2018-11-28: 100 mg via RECTAL

## 2018-11-28 MED ORDER — GLUCAGON HCL RDNA (DIAGNOSTIC) 1 MG IJ SOLR
INTRAMUSCULAR | Status: AC
  Filled 2018-11-28: qty 1

## 2018-11-28 MED ORDER — DEXAMETHASONE SODIUM PHOSPHATE 10 MG/ML IJ SOLN
INTRAMUSCULAR | Status: DC | PRN
  Administered 2018-11-28: 10 mg via INTRAVENOUS

## 2018-11-28 MED ORDER — INDOMETHACIN 50 MG RE SUPP
RECTAL | Status: AC
  Filled 2018-11-28: qty 2

## 2018-11-28 MED ORDER — SODIUM CHLORIDE 0.9 % IV SOLN
INTRAVENOUS | Status: DC | PRN
  Administered 2018-11-28: 15 ug/min via INTRAVENOUS

## 2018-11-28 MED ORDER — CIPROFLOXACIN IN D5W 400 MG/200ML IV SOLN
INTRAVENOUS | Status: DC | PRN
  Administered 2018-11-28: 400 mg via INTRAVENOUS

## 2018-11-28 MED ORDER — CIPROFLOXACIN IN D5W 400 MG/200ML IV SOLN
INTRAVENOUS | Status: AC
  Filled 2018-11-28: qty 200

## 2018-11-28 MED ORDER — LIDOCAINE 2% (20 MG/ML) 5 ML SYRINGE
INTRAMUSCULAR | Status: DC | PRN
  Administered 2018-11-28: 60 mg via INTRAVENOUS

## 2018-11-28 MED ORDER — SODIUM CHLORIDE 0.9 % IV SOLN
INTRAVENOUS | Status: DC | PRN
  Administered 2018-11-28: 14:00:00 20 mL

## 2018-11-28 MED ORDER — SUGAMMADEX SODIUM 200 MG/2ML IV SOLN
INTRAVENOUS | Status: DC | PRN
  Administered 2018-11-28: 250 mg via INTRAVENOUS

## 2018-11-28 MED ORDER — ROCURONIUM BROMIDE 10 MG/ML (PF) SYRINGE
PREFILLED_SYRINGE | INTRAVENOUS | Status: DC | PRN
  Administered 2018-11-28: 50 mg via INTRAVENOUS

## 2018-11-28 MED ORDER — HYDRALAZINE HCL 20 MG/ML IJ SOLN
5.0000 mg | Freq: Once | INTRAMUSCULAR | Status: AC
  Administered 2018-11-28: 5 mg via INTRAVENOUS
  Filled 2018-11-28: qty 1

## 2018-11-28 MED ORDER — PHENOL 1.4 % MT LIQD
1.0000 | OROMUCOSAL | Status: DC | PRN
  Administered 2018-11-28: 1 via OROMUCOSAL
  Filled 2018-11-28: qty 177

## 2018-11-28 MED ORDER — FENTANYL CITRATE (PF) 100 MCG/2ML IJ SOLN
INTRAMUSCULAR | Status: AC
  Filled 2018-11-28: qty 2

## 2018-11-28 MED ORDER — FENTANYL CITRATE (PF) 250 MCG/5ML IJ SOLN
INTRAMUSCULAR | Status: DC | PRN
  Administered 2018-11-28: 25 ug via INTRAVENOUS
  Administered 2018-11-28: 50 ug via INTRAVENOUS
  Administered 2018-11-28: 25 ug via INTRAVENOUS

## 2018-11-28 MED ORDER — LACTATED RINGERS IV SOLN
INTRAVENOUS | Status: DC
  Administered 2018-11-28: 13:00:00 via INTRAVENOUS

## 2018-11-28 MED ORDER — HYDRALAZINE HCL 25 MG PO TABS
25.0000 mg | ORAL_TABLET | Freq: Two times a day (BID) | ORAL | Status: DC
  Administered 2018-11-28 – 2018-11-29 (×2): 25 mg via ORAL
  Filled 2018-11-28 (×2): qty 1

## 2018-11-28 MED ORDER — SODIUM CHLORIDE 0.9 % IV SOLN
INTRAVENOUS | Status: DC
  Administered 2018-11-28: 17:00:00 via INTRAVENOUS

## 2018-11-28 MED ORDER — PROPOFOL 10 MG/ML IV BOLUS
INTRAVENOUS | Status: DC | PRN
  Administered 2018-11-28: 120 mg via INTRAVENOUS

## 2018-11-28 MED ORDER — LACTATED RINGERS IV SOLN: INTRAVENOUS | Status: DC

## 2018-11-28 MED ORDER — DIPHENHYDRAMINE HCL 50 MG/ML IJ SOLN: 12.5000 mg | Freq: Once | INTRAMUSCULAR | Status: DC

## 2018-11-28 MED ORDER — MORPHINE SULFATE (PF) 2 MG/ML IV SOLN: 2.0000 mg | INTRAVENOUS | Status: DC | PRN

## 2018-11-28 NOTE — Anesthesia Preprocedure Evaluation (Signed)
Anesthesia Evaluation  Patient identified by MRN, date of birth, ID band Patient awake    Reviewed: Allergy & Precautions, NPO status , Patient's Chart, lab work & pertinent test results  Airway Mallampati: II  TM Distance: >3 FB     Dental  (+) Dental Advisory Given   Pulmonary asthma , sleep apnea , COPD,    breath sounds clear to auscultation       Cardiovascular hypertension, Pt. on medications and Pt. on home beta blockers + CAD and + CABG   Rhythm:Regular Rate:Normal     Neuro/Psych  Neuromuscular disease    GI/Hepatic hiatal hernia, GERD  ,Obstructive jaundice from pancreatic mass   Endo/Other  negative endocrine ROS  Renal/GU negative Renal ROS     Musculoskeletal  (+) Arthritis , Fibromyalgia -  Abdominal   Peds  Hematology negative hematology ROS (+)   Anesthesia Other Findings   Reproductive/Obstetrics                             Lab Results  Component Value Date   WBC 6.8 11/27/2018   HGB 11.7 (L) 11/27/2018   HCT 34.5 (L) 11/27/2018   MCV 89.6 11/27/2018   PLT 504 (H) 11/27/2018   Lab Results  Component Value Date   CREATININE 0.54 11/28/2018   BUN 8 11/28/2018   NA 128 (L) 11/28/2018   K 3.4 (L) 11/28/2018   CL 94 (L) 11/28/2018   CO2 22 11/28/2018    Anesthesia Physical Anesthesia Plan  ASA: III  Anesthesia Plan: General   Post-op Pain Management:    Induction: Intravenous  PONV Risk Score and Plan: 3 and Dexamethasone, Ondansetron and Treatment may vary due to age or medical condition  Airway Management Planned: Oral ETT  Additional Equipment:   Intra-op Plan:   Post-operative Plan: Extubation in OR  Informed Consent: I have reviewed the patients History and Physical, chart, labs and discussed the procedure including the risks, benefits and alternatives for the proposed anesthesia with the patient or authorized representative who has indicated  his/her understanding and acceptance.     Dental advisory given  Plan Discussed with: CRNA  Anesthesia Plan Comments:         Anesthesia Quick Evaluation

## 2018-11-28 NOTE — Anesthesia Postprocedure Evaluation (Signed)
Anesthesia Post Note  Patient: [name]  Procedure(s) Performed: ENDOSCOPIC RETROGRADE CHOLANGIOPANCREATOGRAPHY (ERCP) (N/A ) BILIARY BRUSHING BILIARY STENT PLACEMENT     Patient location during evaluation: PACU Anesthesia Type: General Level of consciousness: awake and alert Pain management: pain level controlled Vital Signs Assessment: post-procedure vital signs reviewed and stable Respiratory status: spontaneous breathing, nonlabored ventilation, respiratory function stable and patient connected to nasal cannula oxygen Cardiovascular status: blood pressure returned to baseline and stable Postop Assessment: no apparent nausea or vomiting Anesthetic complications: no    Last Vitals:  Vitals:   11/28/18 1520 11/28/18 1619  BP: (!) 168/73 (!) 171/79  Pulse: 83 79  Resp:  18  Temp:  36.8 C  SpO2: 95% 92%    Last Pain:  Vitals:   11/28/18 1619  TempSrc: Oral  PainSc:                  Tiajuana Amass

## 2018-11-28 NOTE — Progress Notes (Signed)
Patient complaints of increased itching on face, redness and swelling around mouth and eyes. MD paged and orders received. Will continue to monitor patient.

## 2018-11-28 NOTE — Progress Notes (Signed)
Patient refused CPAP for tonight. No respiratory distress noted. RT instructed patient to have RN call RT if she changes her mind. RT will monitor as needed.

## 2018-11-28 NOTE — Transfer of Care (Signed)
Immediate Anesthesia Transfer of Care Note  Patient: [name]  Procedure(s) Performed: ENDOSCOPIC RETROGRADE CHOLANGIOPANCREATOGRAPHY (ERCP) (N/A ) BILIARY BRUSHING BILIARY STENT PLACEMENT  Patient Location: Endoscopy Unit  Anesthesia Type:General  Level of Consciousness: awake, alert  and oriented  Airway & Oxygen Therapy: Patient Spontanous Breathing and Patient connected to nasal cannula oxygen  Post-op Assessment: Report given to RN and Post -op Vital signs reviewed and stable  Post vital signs: Reviewed and stable  Last Vitals:  Vitals Value Taken Time  BP 189/69 11/28/18 1429  Temp 37 C 11/28/18 1429  Pulse 80 11/28/18 1432  Resp 20 11/28/18 1432  SpO2 95 % 11/28/18 1432  Vitals shown include unvalidated device data.  Last Pain:  Vitals:   11/28/18 1429  TempSrc: Oral  PainSc: 2          Complications: No apparent anesthesia complications

## 2018-11-28 NOTE — Op Note (Addendum)
[facility] Patient Name: [name] Procedure Date : 11/28/2018 MRN: [id number] Attending MD: Gatha Mayer , MD Date of Birth: [birth year] CSN: [id number] Age: 83 Admit Type: Inpatient Procedure:                ERCP Indications:              Biliary dilation on Computed Tomogram Scan,                            Pancreatic tumor on Computed Tomogram Scan, Jaundice Providers:                Gatha Mayer, MD, Burtis Junes, RN, Raynelle Bring,                            RN, William Dalton, Technician Referring MD:              Medicines:                General Anesthesia, Cipro [id number] mg IV Complications:            No immediate complications. Estimated Blood Loss:     Estimated blood loss: none. Procedure:                Pre-Anesthesia Assessment:                           - Prior to the procedure, a History and Physical                            was performed, and patient medications and                            allergies were reviewed. The patient's tolerance of                            previous anesthesia was also reviewed. The risks                            and benefits of the procedure and the sedation                            options and risks were discussed with the patient.                            All questions were answered, and informed consent                            was obtained. Prior Anticoagulants: The patient has                            taken no previous anticoagulant or antiplatelet                            agents. ASA Grade Assessment: III - A patient with  severe systemic disease. After reviewing the risks                            and benefits, the patient was deemed in                            satisfactory condition to undergo the procedure.                           After obtaining informed consent, the scope was                            passed under direct vision. Throughout the            procedure, the patient's blood pressure, pulse, and                            oxygen saturations were monitored continuously. The                            [id number]) Olympus Duodensocope was                            introduced through the mouth, and used to inject                            contrast into and used to inject contrast into the                            bile duct. The ERCP was accomplished without                            difficulty. The patient tolerated the procedure                            well. Scope In: Scope Out: Findings:      The scout film was normal. The esophagus was successfully intubated       under direct vision. The scope was advanced to a normal major papilla in       the descending duodenum without detailed examination of the pharynx,       larynx and associated structures, and upper GI tract. The upper GI tract       was grossly normal. There was a small periampullary diverticulum with       papilla on edge.      CBD was cannulated deeply with wire via sphincterotome. Contrast       injected and showed a short distal CBD stricture with procimal extra-       and intrahepatic dilation., Maximal about 16 mm. Small amount of mucoid       material came out persistently c/w some cholangitis perhaps. Gallballder       did not fill but cystic duct did. Cytology brusging x 2 of the distal       CBD taken. Then a 5 cm 10 Fr plastic BS stent placed. No attempt at       pancreatogram. Impression:               -  A single biliary stricture was found in the lower                            third of the main bile duct. The stricture was                            malignant appearing. This was sampled by brushing.                            This stricture was treated with 5 cm 10 Fr plastic                            stent placement.                           - Small periampullary diverticulum Moderate Sedation:      Please see anesthesia  notes, moderate sedation not given Recommendation:           - Return patient to hospital ward for ongoing care.                           - Clear liquid diet.                           HOME TOMORROW IF OK AND I WILL ARRANGE NEXT STEPS                            WHICH WILL BE EUS, ONCOLOGY AND SURGERY APPOINTMENTS                           I SPOKE TO HUSBAND                           WILL DO CHEST CT TO STAGE FURTHER Procedure Code(s):        --- Professional ---                           [phone], Endoscopic retrograde                            cholangiopancreatography (ERCP); with placement of                            endoscopic stent into biliary or pancreatic duct,                            including pre- and post-dilation and guide wire                            passage, when performed, including sphincterotomy,                            when performed, each stent Diagnosis Code(s):        --- Professional ---  K83.1, Obstruction of bile duct                           D49.0, Neoplasm of unspecified behavior of                            digestive system                           R17, Unspecified jaundice                           K83.8, Other specified diseases of biliary tract CPT copyright 2019 [employer]. All rights reserved. The codes documented in this report are preliminary and upon coder review may  be revised to meet current compliance requirements. Gatha Mayer, MD 11/28/2018 2:19:31 PM This report has been signed electronically. Number of Addenda: 0

## 2018-11-28 NOTE — Anesthesia Procedure Notes (Signed)
Procedure Name: Intubation Date/Time: 11/28/2018 1:30 PM Performed by: Clearnce Sorrel, CRNA Pre-anesthesia Checklist: Patient identified, Emergency Drugs available, Suction available, Patient being monitored and Timeout performed Patient Re-evaluated:Patient Re-evaluated prior to induction Oxygen Delivery Method: Circle system utilized Preoxygenation: Pre-oxygenation with 100% oxygen Induction Type: IV induction Ventilation: Mask ventilation without difficulty Laryngoscope Size: Mac and 3 Grade View: Grade I Tube type: Oral Tube size: 7.0 mm Number of attempts: 2 Airway Equipment and Method: Stylet Placement Confirmation: ETT inserted through vocal cords under direct vision,  positive ETCO2 and breath sounds checked- equal and bilateral Secured at: 22 cm Tube secured with: Tape Dental Injury: Teeth and Oropharynx as per pre-operative assessment

## 2018-11-28 NOTE — Consult Note (Signed)
Consultation  Referring Provider:      Primary Care Physician:  Lucille Passy, MD Primary Gastroenterologist:        Fuller Plan Reason for Consultation:     Jaundice and pancreatic mass     Impression / Plan:   1) Mass in head of pancreas that is most likely adenocarcinoma  2) Obstructive jaundice and pruritis from mass  3) Hyponatremia - mild   1) ERCP with cytology brushings/stent today 1130 tentatively. The risks and benefits as well as alternatives of endoscopic procedure(s) have been discussed and reviewed. All questions answered. The patient agrees to proceed.  She understands there is a 3 to 5% chance of pancreatitis and other serious health problems related to the procedure.  2) If that goes well then home soon (tomorrow likely) and rest of w/u as outpatient  3) Will need an EUS, oncology consults and sugical oncology consult all of which best done outpatient due to specific MD's that handle those - assuming no other problems arise  4) I have given a brief outline of how are staging and work-up and treatment for pancreatic cancer work and explained a little bit to the husband over the phone.   I appreciate the opportunity to care for this patient. Gatha Mayer, MD, [facility] 11/28/2018 9:25 AM Pager [phone]    HPI:   [name] is a 83 y.o. female admitted overnight with a pancreatic mass jaundice and pruritus.  She has been having epigastric pain off and on for several months and has had both in person pre-COVID visit and then post [place] visits.  PPI was changed, FD guard was used, and she was feeling a little better at times and declined additional evaluations that included imaging and EGD.  Appetite has been off a bit and she started having itching which she thought was dry skin and then noticed she was jaundiced, scleral icterus, dark urine and acholic stools.  Also a gnawing abdominal pain.  Wt Readings from Last 3  Encounters:  11/28/18 58.6 kg  09/21/18 61.7 kg  08/24/18 61.7 kg     Past Medical History:  Diagnosis Date   Asthma    Atrophic vaginitis    Breast cancer, Left 12/20/2010   NO BLOOD PRESSURE CHECKS OR STICKS IN LEFT ARM   CAD (coronary artery disease)    a. 01/19/2010 s/p CABG x 3, lima->lad, vg->diag, vg->om1;  b. 06/2011 :Lexi MV: EF 84%, No ischemia. c. 01/06/14 s/p negative nuclear stress test with EF >70%   Cervical arthritis    Colonic polyp 04-27-2009   tubular adenoma   Diverticulosis of colon (without mention of hemorrhage)    Fibromyalgia    Gallstones    GERD (gastroesophageal reflux disease)    Glaucoma    Headache(784.0)    a. frequently assocaited with high BPs   Hiatal hernia    Hypercholesterolemia    Irritable bowel syndrome    Labile hypertension    Lymphedema    left arm   Pinched vertebral nerve     Past Surgical History:  Procedure Laterality Date   23 HOUR [place] STUDY N/A 01/08/2016   Procedure: 24 HOUR [place] STUDY;  Surgeon: Ladene Artist, MD;  Location: [facility] ENDOSCOPY;  Service: Endoscopy;  Laterality: N/A;   BREAST LUMPECTOMY Left 02/06/11   CATARACT EXTRACTION, BILATERAL     bilateral caaract removal,   COLONOSCOPY     CORONARY ANGIOPLASTY WITH STENT PLACEMENT  Stent 2007   CORONARY ARTERY BYPASS GRAFT     ESOPHAGEAL MANOMETRY N/A 01/08/2016   Procedure: ESOPHAGEAL MANOMETRY (EM);  Surgeon: Ladene Artist, MD;  Location: [facility] ENDOSCOPY;  Service: Endoscopy;  Laterality: N/A;   ESOPHAGOGASTRODUODENOSCOPY      Family History  Problem Relation Age of Onset   Stroke Mother    Stroke Father    Prostate cancer Father    Breast cancer Sister    Diabetes Sister    Cancer Brother        gland cancer   Heart disease Brother    Colon cancer Neg Hx    Stomach cancer Neg Hx    Pancreatic cancer Neg Hx    Kidney disease Neg Hx    Liver disease Neg Hx     Social History   Tobacco Use   Smoking status:  Never Smoker   Smokeless tobacco: Never Used  Substance Use Topics   Alcohol use: No    Alcohol/week: 0.0 standard drinks   Drug use: No   Social History   Social History Narrative   Patient lives at home with her husband Marcello Moores). 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    Caffeine- sometimes- One cup of coffee.   Right handed.   Two daughters   Four granddaughters.   No EtOH, Tobacco, drugs    Prior to Admission medications   Medication Sig Start Date End Date Taking? Authorizing Provider  albuterol (VENTOLIN HFA) 108 (90 Base) MCG/ACT inhaler Inhale 2 puffs into the lungs every 6 (six) hours as needed for wheezing or shortness of breath.    [provider]  AMBULATORY NON FORMULARY MEDICATION as needed. FDGARD    [provider]  aspirin EC 81 MG tablet Take 81 mg by mouth daily.    [provider]  CALCIUM PO Take by mouth.    [provider]  EPINEPHrine 0.3 mg/0.3 mL IJ SOAJ injection UAD for severe reaction 04/01/18   [provider]  fexofenadine (ALLEGRA) 180 MG tablet Take 180 mg by mouth daily.      [provider]  Glucosamine-Chondroit-Vit C-Mn (GLUCOSAMINE CHONDR 1500 COMPLX PO) Take 1 tablet by mouth 3 (three) times daily.      [provider]  hydrALAZINE (APRESOLINE) 50 MG tablet Take 0.5 tablets (25 mg total) by mouth 3 (three) times daily. 04/13/18   Lucille Passy, MD  latanoprost (XALATAN) 0.005 % ophthalmic solution Place 1 drop into both eyes at bedtime.  07/31/12   [provider]  losartan (COZAAR) 100 MG tablet TAKE 1/2 TABLET BY MOUTH THREE TIMES A DAY 08/06/18   Lucille Passy, MD  metoprolol tartrate (LOPRESSOR) 50 MG tablet TAKE 1 TABLET BY MOUTH THREE TIMES A DAY 12/23/17   Josue Hector, MD  nitroGLYCERIN (NITROSTAT) 0.4 MG SL tablet Place 1 tablet (0.4 mg total) under the tongue every 5 (five) minutes as needed for chest pain. 04/18/16    Josue Hector, MD  pantoprazole (PROTONIX) 40 MG tablet Take 1 tablet (40 mg total) by mouth 2 (two) times daily. Take 30-60 minutes before breakfast 06/24/18   Ladene Artist, MD  Psyllium (METAMUCIL FIBER PO) Take 1 tablet by mouth daily.     [provider]  rosuvastatin (CRESTOR) 5 MG tablet TAKE 1 TABLET BY MOUTH 3 TIMES A WEEK. (TAKE ON Adelfa Koh [facility], AND FRIDAY) 04/13/18   Lucille Passy, MD  sucralfate (CARAFATE) 1 g tablet TAKE 1 TABLET BY MOUTH BY MOUTH TWICE DAILY(ONE BEFORE DINNER) 10/27/18   Ladene Artist, MD  traMADol Veatrice Bourbon) 50 MG tablet Take 1/2 to 1 tablet by mouth three times daily as needed 06/19/16   Noralee Space, MD  vitamin B-12 (CYANOCOBALAMIN) 1000 MCG tablet Take 1,000 mcg by mouth daily.      [provider]  VITAMIN D PO Take by mouth.    [provider]    Current Facility-Administered Medications  Medication Dose Route Frequency Provider Last Rate Last Dose   0.9 %  sodium chloride infusion   Intravenous Continuous Gatha Mayer, MD 75 mL/hr at 11/28/18 0044     acetaminophen (TYLENOL) tablet 650 mg  650 mg Oral Q6H PRN Etta Quill, DO       Or   acetaminophen (TYLENOL) suppository 650 mg  650 mg Rectal Q6H PRN Etta Quill, DO       insulin aspart (novoLOG) injection 0-15 Units  0-15 Units Subcutaneous Q4H Etta Quill, DO   2 Units at 11/28/18 [id number]   morphine 2 MG/ML injection 2-4 mg  2-4 mg Intravenous Q4H PRN Etta Quill, DO       ondansetron [facility]) tablet 4 mg  4 mg Oral Q6H PRN Etta Quill, DO       Or   ondansetron [facility]) injection 4 mg  4 mg Intravenous Q6H PRN Etta Quill, DO        Allergies as of 11/27/2018 - Review Complete 11/27/2018  Allergen Reaction Noted   Levaquin [levofloxacin in d5w]  12/30/2013   Choline fenofibrate Other (See Comments)    Hctz [hydrochlorothiazide]  01/14/2014   Simvastatin Other (See Comments)    Adhesive [tape] Rash 01/14/2011   Bentyl  [dicyclomine hcl]  05/16/2015   Ceclor [cefaclor] Rash 07/25/2010   Clarithromycin Rash 05/10/2010   Codeine Nausea Only 04/13/2009   Doxycycline Rash 07/25/2010   Hydrocodone  02/06/2011   Lisinopril Cough    Penicillins Itching and Rash 04/13/2009   Tobramycin-dexamethasone Rash 07/25/2010     Review of Systems:    This is positive for those things mentioned in the HPI, also positive for mild fatigue All other review of systems are negative.       Physical Exam:  Vital signs in last 24 hours: Temp:  [97.8 F (36.6 C)-98.2 F (36.8 C)] 98.2 F (36.8 C) (10/03 0410) Pulse Rate:  [66-73] 72 (10/03 0410) Resp:  [14-18] 18 (10/03 0410) BP: (159-175)/(74-133) 161/74 (10/03 0410) SpO2:  [94 %-98 %] 94 % (10/03 0410) Weight:  [58.6 kg] 58.6 kg (10/03 0322)    General:  Well-developed, well-nourished and in no acute distress Eyes:  icteric.  Neck:   supple w/o thyromegaly or mass.  Lungs: Clear to auscultation bilaterally. Heart:  S1S2, no rubs, murmurs, gallops. Abdomen:  soft, mildly tender epigastrium , no hepatosplenomegaly, hernia, or mass and BS+.  Lymph:  no cervical or supraclavicular adenopathy. Extremities:   no edema Skin  jaundiced Neuro:  A&O x 3.  Psych:  appropriate mood and  Affect.   Data Reviewed:   LAB RESULTS: Recent Labs    11/26/18 1330 11/27/18 1440  WBC 5.8 6.8  HGB 10.7* 11.7*  HCT 31.8* 34.5*  PLT 405.0* 504*   BMET Recent Labs    11/27/18 1440 11/28/18 0036 11/28/18 0533  NA 129* 130* 128*  K 3.7 5.9* 3.4*  CL 92* 94* 94*  CO2  26 25 22   GLUCOSE 312* 252* 207*  BUN 11 8 8   CREATININE 0.43* 0.50 0.54  CALCIUM 9.5 9.1 8.7*   LFT Recent Labs    11/28/18 0036  PROT 7.2  ALBUMIN 3.3*  AST 94*  ALT 122*  ALKPHOS 429*  BILITOT 12.1*    STUDIES: images viewed by me Dg Chest 2 View  Result Date: 11/27/2018 CLINICAL DATA:  Chronic cough. EXAM: CHEST - 2 VIEW COMPARISON:  Radiographs of June 19, 2017. FINDINGS:  The heart size and mediastinal contours are within normal limits. Both lungs are clear. Status post coronary bypass graft. No pneumothorax or pleural effusion is noted. The visualized skeletal structures are unremarkable. IMPRESSION: No active cardiopulmonary disease. Electronically Signed   By: Marijo Conception M.D.   On: 11/27/2018 16:34   Ct Abdomen Pelvis W Contrast  Result Date: 11/27/2018 CLINICAL DATA:  Abnormal ultrasound, biliary ductal dilatation, assess for pancreatic mass EXAM: CT ABDOMEN AND PELVIS WITH CONTRAST TECHNIQUE: Multidetector CT imaging of the abdomen and pelvis was performed using the standard protocol following bolus administration of intravenous contrast. CONTRAST:  16mL OMNIPAQUE IOHEXOL 300 MG/ML SOLN, 26mL OMNIPAQUE IOHEXOL 300 MG/ML SOLN, additional oral enteric contrast COMPARISON:  Same day right upper quadrant ultrasound, 11/27/2018, CT abdomen pelvis, 05/04/2014 FINDINGS: Lower chest: No acute abnormality. Calcification within the left breast. Hepatobiliary: No solid liver abnormality is seen. There is gross intrahepatic and extrahepatic biliary ductal dilatation with abrupt cut off of the distal common bile duct in the pancreatic head. Distention of the gallbladder. Pancreas: There is an ill-defined, hypodense mass of the pancreatic head measuring 1.9 x 1.6 x 1.6 cm (series 2, image 26, series 5, image 35). Gross dilatation of the pancreatic duct with abrupt cut off in the pancreatic head at the level of the mass. There may be minimal abutment of the SMV near the portal confluence without definite contact or encasement (series 2, image 23, series 5, image 34). No abutment or encasement of other adjacent vessels. Spleen: Normal in size without significant abnormality. Adrenals/Urinary Tract: Adrenal glands are unremarkable. Kidneys are normal, without renal calculi, solid lesion, or hydronephrosis. Bladder is unremarkable. Stomach/Bowel: Stomach is within normal limits.  Incidental diverticulum of the descending portion of the duodenum. Appendix is very diminutive (series 2, image 87). No evidence of bowel wall thickening, distention, or inflammatory changes. Sigmoid diverticulosis. Vascular/Lymphatic: Aortic atherosclerosis. No enlarged abdominal or pelvic lymph nodes. Reproductive: No mass or other significant abnormality. The left ovary appears to be surgically absent. Other: No abdominal wall hernia or abnormality. No abdominopelvic ascites. Musculoskeletal: No acute or significant osseous findings. IMPRESSION: 1. There is an ill-defined, hypodense mass of the pancreatic head measuring 1.9 x 1.6 x 1.6 cm (series 2, image 26, series 5, image 35) with gross biliary and pancreatic ductal dilatation. Findings are consistent with pancreatic adenocarcinoma. There may be minimal abutment of the SMV near the portal confluence without definite contact or encasement (series 2, image 23, series 5, image 34). No abutment or encasement of other adjacent vessels. Pancreatic protocol MRI may be helpful to better assess for resectability if desired. 2.  No evidence of abdominal lymphadenopathy or metastatic disease. 3. Other chronic and incidental findings as detailed above. Aortic Atherosclerosis (ICD10-I70.0). Electronically Signed   By: Eddie Candle M.D.   On: 11/27/2018 21:06   US Abdomen Limited Ruq  Result Date: 11/27/2018 CLINICAL DATA:  History of gallstones, jaundice, upper abdominal pain, nausea. EXAM: ULTRASOUND ABDOMEN LIMITED RIGHT UPPER QUADRANT COMPARISON:  Abdominal ultrasound 12/08/2015 FINDINGS: Gallbladder: The gallbladder is distended. There is a mild amount of sludge. No gallbladder wall thickening. No sonographic Murphy sign noted by sonographer. Common bile duct: Diameter: 1.3 cm Liver: No focal lesion identified. Within normal limits in parenchymal echogenicity. Portal vein is patent on color Doppler imaging with normal direction of blood flow towards the liver.  There is intrahepatic biliary duct dilation. Other: There is suggestion of a mass at the head of the pancreas measuring 2.6 x 2.8 by 3.1 cm. The main pancreatic duct is dilated measuring 1.1 cm. IMPRESSION: 1.  Intra and extrahepatic biliary duct dilation. 2. Suggestion of a mass at the head of the pancreas as well as dilation of the main pancreatic duct. Further evaluation with cross-sectional imaging is recommended. 3. Distended gallbladder with a mild amount of sludge. No significant inflammatory changes. Electronically Signed   By: Audie Pinto M.D.   On: 11/27/2018 16:33        Thanks   LOS: 1 day   @Cambree Hendrix  Simonne Maffucci, MD, [facility] @  11/28/2018, 9:24 AM

## 2018-11-28 NOTE — Progress Notes (Addendum)
PROGRESS NOTE    [name]  EYE:[id number] DOB: October 31, 1935 DOA: 11/27/2018 PCP: Lucille Passy, MD   Brief Narrative:  This is an 83 year old female with past medical history of CAD status post CABG, hypertension who presented to the ED with worsening abdominal pain and itching found to have significantly abnormal LFTs by PCP and sent to [facility].  She was found to have a pancreatic mass with obstructive jaundice and underwent ERCP with stent on 11/28/2018.  General surgery was consulted for consideration of Whipple.   Assessment & Plan:   Principal Problem:   Pancreatic mass Active Problems:   CAD (coronary artery disease)   OSA on CPAP   HTN (hypertension)   Obstructive jaundice due to malignant neoplasm ([place])  1. Pancreatic mass with obstructive jaundice consistent with pancreatic adenocarcinoma s/p ERCP with stent (POD 0). Alk Phos 429, AST 94, ALT 122, T bili 12.1 today.  1. Continue following with GI 2. General surgery consulted for consideration for whipple - likely post discharge if planned 2. Hyponatremia possibly SIADH Na 128, sOsm 272, urine Osm 608, urine Na 73. Has been getting NS throughout the day. Patient is eating 1. Recheck BMP this afternoon and tomorrow 2. Stop fluids 3. TSH 3. CAD 1. Continue statin 2. Aspirin held for procedure 4. Hypertension 1. Restart home Hydralazine 5. Diabetes HA1c 7.5 1. Continue sensitive sliding scale   DVT prophylaxis: scd Code Status: Full code Family Communication: discussed with patient Disposition Plan: pending medical stability   Consultants:   GI, General surgery  Procedures: ERCP with stent  Antimicrobials: none  Subjective: This is a very pleasant 83 year old female with past medical history of CAD status post CABG, hypertension who presented to the ED with worsening abdominal pain and itching found to have significantly abnormal LFTs by PCP and sent to [facility].  She was found to have a pancreatic  mass with obstructive jaundice and underwent ERCP with stent on 11/28/2018.   Patient denies any other complaints at this time.  Objective: Vitals:   11/28/18 1448 11/28/18 1500 11/28/18 1520 11/28/18 1619  BP: (!) 170/76 (!) 169/82 (!) 168/73 (!) 171/79  Pulse: 78 82 83 79  Resp: '17 15  18  '$ Temp:    98.3 F (36.8 C)  TempSrc:    Oral  SpO2: 97% (!) 89% 95% 92%  Weight:      Height:        Intake/Output Summary (Last 24 hours) at 11/28/2018 1645 Last data filed at 11/28/2018 1425 Gross per 24 hour  Intake 787.86 ml  Output 250 ml  Net 537.86 ml   Filed Weights   11/28/18 0322  Weight: 58.6 kg    Examination:  Physical Exam Vitals signs and nursing note reviewed.  Constitutional:      General: She is not in acute distress. HENT:     Head: Normocephalic and atraumatic.     Comments: Scleral icterus    Mouth/Throat:     Mouth: Mucous membranes are moist.  Eyes:     Extraocular Movements: Extraocular movements intact.  Neck:     Musculoskeletal: Normal range of motion. No neck rigidity.  Cardiovascular:     Rate and Rhythm: Normal rate and regular rhythm.  Pulmonary:     Effort: Pulmonary effort is normal.     Breath sounds: Normal breath sounds.  Abdominal:     General: Abdomen is flat.     Palpations: Abdomen is soft. There is no mass.  Musculoskeletal:  Normal range of motion.        General: No swelling.  Skin:    General: Skin is warm.     Coloration: Skin is jaundiced.  Neurological:     Mental Status: She is alert. Mental status is at baseline.  Psychiatric:        Mood and Affect: Mood normal.        Behavior: Behavior normal.        Data Reviewed: I have personally reviewed following labs and imaging studies  CBC: Recent Labs  Lab 11/26/18 1330 11/27/18 1440  WBC 5.8 6.8  NEUTROABS 2.6 3.5  HGB 10.7* 11.7*  HCT 31.8* 34.5*  MCV 92.0 89.6  PLT 405.0* 675*   Basic Metabolic Panel: Recent Labs  Lab 11/26/18 1330 11/27/18 1440  11/28/18 0036 11/28/18 0533 11/28/18 0744  NA 127* 129* 130* 128*  --   K 3.8 3.7 5.9* 3.4*  --   CL 91* 92* 94* 94*  --   CO2 '27 26 25 22  '$ --   GLUCOSE 364* 312* 252* 207*  --   BUN '10 11 8 8  '$ --   CREATININE 0.66 0.43* 0.50 0.54  --   CALCIUM 9.5 9.5 9.1 8.7*  --   MG  --   --   --   --  1.8  PHOS  --   --   --   --  3.9   GFR: Estimated Creatinine Clearance: 41.8 mL/min (by C-G formula based on SCr of 0.54 mg/dL). Liver Function Tests: Recent Labs  Lab 11/26/18 1330 11/27/18 1440 11/28/18 0036  AST 63* 84* 94*  ALT 114* 132* 122*  ALKPHOS 409* 451* 429*  BILITOT 7.5* 9.1* 12.1*  PROT 7.4 8.1 7.2  ALBUMIN 3.9 3.8 3.3*   Recent Labs  Lab 11/27/18 1440  LIPASE 20   Recent Labs  Lab 11/26/18 1330 11/27/18 1517  AMMONIA 44* 33   Coagulation Profile: No results for input(s): INR, PROTIME in the last 168 hours. Cardiac Enzymes: No results for input(s): CKTOTAL, CKMB, CKMBINDEX, TROPONINI in the last 168 hours. BNP (last 3 results) No results for input(s): PROBNP in the last 8760 hours. HbA1C: Recent Labs    11/28/18 0036  HGBA1C 7.5*   CBG: Recent Labs  Lab 11/27/18 2343 11/28/18 0405 11/28/18 0725 11/28/18 1113 11/28/18 1618  GLUCAP 215* 219* 136* 116* 179*   Lipid Profile: No results for input(s): CHOL, HDL, LDLCALC, TRIG, CHOLHDL, LDLDIRECT in the last 72 hours. Thyroid Function Tests: No results for input(s): TSH, T4TOTAL, FREET4, T3FREE, THYROIDAB in the last 72 hours. Anemia Panel: No results for input(s): VITAMINB12, FOLATE, FERRITIN, TIBC, IRON, RETICCTPCT in the last 72 hours. Sepsis Labs: No results for input(s): PROCALCITON, LATICACIDVEN in the last 168 hours.  Recent Results (from the past 240 hour(s))  Novel Coronavirus, NAA ([facility])     Status: None   Collection Time: 11/23/18 12:00 AM   Specimen: Oropharyngeal(OP) collection in vial transport medium   OROPHARYNGEA  TESTING  Result Value Ref Range Status   SARS-CoV-2, NAA Not  Detected Not Detected Final    Comment: This nucleic acid amplification test was developed and its performance characteristics determined by [employer]. Nucleic acid amplification tests include PCR and TMA. This test has not been FDA cleared or approved. This test has been authorized by FDA under an Emergency Use Authorization (EUA). This test is only authorized for the duration of time the declaration that circumstances exist justifying the authorization of the  emergency use of in vitro diagnostic tests for detection of SARS-CoV-2 virus and/or diagnosis of COVID-19 infection under section 564(b)(1) of the Act, 21 U.S.C. 440NUU-7(O) (1), unless the authorization is terminated or revoked sooner. When diagnostic testing is negative, the possibility of a false negative result should be considered in the context of a patient's recent exposures and the presence of clinical signs and symptoms consistent with COVID-19. An individual without symptoms of COVID-19 and who is not shedding SARS-CoV-2 virus would  expect to have a negative (not detected) result in this assay.   SARS CORONAVIRUS 2 (TAT 6-24 HRS) Nasopharyngeal Nasopharyngeal Swab     Status: None   Collection Time: 11/28/18  1:45 AM   Specimen: Nasopharyngeal Swab  Result Value Ref Range Status   SARS Coronavirus 2 NEGATIVE NEGATIVE Final    Comment: (NOTE) SARS-CoV-2 target nucleic acids are NOT DETECTED. The SARS-CoV-2 RNA is generally detectable in upper and lower respiratory specimens during the acute phase of infection. Negative results do not preclude SARS-CoV-2 infection, do not rule out co-infections with other pathogens, and should not be used as the sole basis for treatment or other patient management decisions. Negative results must be combined with clinical observations, patient history, and epidemiological information. The expected result is Negative. Fact Sheet for Patients:  SugarRoll.be Fact Sheet for Healthcare Providers: https://www.woods-mathews.com/ This test is not yet approved or cleared by the [place] FDA and  has been authorized for detection and/or diagnosis of SARS-CoV-2 by FDA under an Emergency Use Authorization (EUA). This EUA will remain  in effect (meaning this test can be used) for the duration of the COVID-19 declaration under Section 56 4(b)(1) of the Act, 21 U.S.C. section 360bbb-3(b)(1), unless the authorization is terminated or revoked sooner. Performed at [facility], [place] [address] [place], [place] [address]          Radiology Studies: Dg Chest 2 View  Result Date: 11/27/2018 CLINICAL DATA:  Chronic cough. EXAM: CHEST - 2 VIEW COMPARISON:  Radiographs of June 19, 2017. FINDINGS: The heart size and mediastinal contours are within normal limits. Both lungs are clear. Status post coronary bypass graft. No pneumothorax or pleural effusion is noted. The visualized skeletal structures are unremarkable. IMPRESSION: No active cardiopulmonary disease. Electronically Signed   By: Marijo Conception M.D.   On: 11/27/2018 16:34   Ct Abdomen Pelvis W Contrast  Result Date: 11/27/2018 CLINICAL DATA:  Abnormal ultrasound, biliary ductal dilatation, assess for pancreatic mass EXAM: CT ABDOMEN AND PELVIS WITH CONTRAST TECHNIQUE: Multidetector CT imaging of the abdomen and pelvis was performed using the standard protocol following bolus administration of intravenous contrast. CONTRAST:  8m OMNIPAQUE IOHEXOL 300 MG/ML SOLN, 354mOMNIPAQUE IOHEXOL 300 MG/ML SOLN, additional oral enteric contrast COMPARISON:  Same day right upper quadrant ultrasound, 11/27/2018, CT abdomen pelvis, 05/04/2014 FINDINGS: Lower chest: No acute abnormality. Calcification within the left breast. Hepatobiliary: No solid liver abnormality is seen. There is gross intrahepatic and extrahepatic biliary ductal dilatation with  abrupt cut off of the distal common bile duct in the pancreatic head. Distention of the gallbladder. Pancreas: There is an ill-defined, hypodense mass of the pancreatic head measuring 1.9 x 1.6 x 1.6 cm (series 2, image 26, series 5, image 35). Gross dilatation of the pancreatic duct with abrupt cut off in the pancreatic head at the level of the mass. There may be minimal abutment of the SMV near the portal confluence without definite contact or encasement (series 2, image 23, series 5,  image 34). No abutment or encasement of other adjacent vessels. Spleen: Normal in size without significant abnormality. Adrenals/Urinary Tract: Adrenal glands are unremarkable. Kidneys are normal, without renal calculi, solid lesion, or hydronephrosis. Bladder is unremarkable. Stomach/Bowel: Stomach is within normal limits. Incidental diverticulum of the descending portion of the duodenum. Appendix is very diminutive (series 2, image 41). No evidence of bowel wall thickening, distention, or inflammatory changes. Sigmoid diverticulosis. Vascular/Lymphatic: Aortic atherosclerosis. No enlarged abdominal or pelvic lymph nodes. Reproductive: No mass or other significant abnormality. The left ovary appears to be surgically absent. Other: No abdominal wall hernia or abnormality. No abdominopelvic ascites. Musculoskeletal: No acute or significant osseous findings. IMPRESSION: 1. There is an ill-defined, hypodense mass of the pancreatic head measuring 1.9 x 1.6 x 1.6 cm (series 2, image 26, series 5, image 35) with gross biliary and pancreatic ductal dilatation. Findings are consistent with pancreatic adenocarcinoma. There may be minimal abutment of the SMV near the portal confluence without definite contact or encasement (series 2, image 23, series 5, image 34). No abutment or encasement of other adjacent vessels. Pancreatic protocol MRI may be helpful to better assess for resectability if desired. 2.  No evidence of abdominal  lymphadenopathy or metastatic disease. 3. Other chronic and incidental findings as detailed above. Aortic Atherosclerosis (ICD10-I70.0). Electronically Signed   By: Eddie Candle M.D.   On: 11/27/2018 21:06   Dg Ercp  Result Date: 11/28/2018 CLINICAL DATA:  83 year old female with a history of biliary obstruction EXAM: ERCP TECHNIQUE: Multiple spot images obtained with the fluoroscopic device and submitted for interpretation post-procedure. FLUOROSCOPY TIME:  Fluoroscopy Time: 1 minutes 35 seconds COMPARISON:  None FINDINGS: Limited images during ERCP. Initial image demonstrates endoscope projecting over the upper abdomen. There is then ampullary cannulation with a safety wire and partial opacification of the extrahepatic biliary system. Abrupt cutoff of the contrast column compatible with findings on prior CT. Final image demonstrates partial decompressed system with plastic biliary stent in position. IMPRESSION: Limited images during ERCP demonstrates treatment of bile duct obstruction with placement of a plastic biliary stent. Please refer to the dictated operative report for full details of intraoperative findings and procedure. Electronically Signed   By: Corrie Mckusick D.O.   On: 11/28/2018 14:40   US Abdomen Limited Ruq  Result Date: 11/27/2018 CLINICAL DATA:  History of gallstones, jaundice, upper abdominal pain, nausea. EXAM: ULTRASOUND ABDOMEN LIMITED RIGHT UPPER QUADRANT COMPARISON:  Abdominal ultrasound 12/08/2015 FINDINGS: Gallbladder: The gallbladder is distended. There is a mild amount of sludge. No gallbladder wall thickening. No sonographic Murphy sign noted by sonographer. Common bile duct: Diameter: 1.3 cm Liver: No focal lesion identified. Within normal limits in parenchymal echogenicity. Portal vein is patent on color Doppler imaging with normal direction of blood flow towards the liver. There is intrahepatic biliary duct dilation. Other: There is suggestion of a mass at the head of the  pancreas measuring 2.6 x 2.8 by 3.1 cm. The main pancreatic duct is dilated measuring 1.1 cm. IMPRESSION: 1.  Intra and extrahepatic biliary duct dilation. 2. Suggestion of a mass at the head of the pancreas as well as dilation of the main pancreatic duct. Further evaluation with cross-sectional imaging is recommended. 3. Distended gallbladder with a mild amount of sludge. No significant inflammatory changes. Electronically Signed   By: Audie Pinto M.D.   On: 11/27/2018 16:33        Scheduled Meds: . hydrALAZINE  5 mg Intravenous Once  . insulin aspart  0-15 Units Subcutaneous Q4H  Continuous Infusions: . sodium chloride 75 mL/hr at 11/28/18 1631     LOS: 1 day    Time spent: 30 minutes    Harold Hedge, MD [facility] Pager [phone]  If 7PM-7AM, please contact night-coverage [email or web address] Password [email or web address] 11/28/2018, 4:45 PM

## 2018-11-29 ENCOUNTER — Inpatient Hospital Stay (HOSPITAL_COMMUNITY): Payer: Medicare Other

## 2018-11-29 LAB — TSH: TSH: 0.281 u[IU]/mL — ABNORMAL LOW (ref 0.350–4.500)

## 2018-11-29 LAB — GLUCOSE, CAPILLARY
Glucose-Capillary: 165 mg/dL — ABNORMAL HIGH (ref 70–99)
Glucose-Capillary: 198 mg/dL — ABNORMAL HIGH (ref 70–99)
Glucose-Capillary: 322 mg/dL — ABNORMAL HIGH (ref 70–99)
Glucose-Capillary: 345 mg/dL — ABNORMAL HIGH (ref 70–99)

## 2018-11-29 LAB — COMPREHENSIVE METABOLIC PANEL
ALT: 111 U/L — ABNORMAL HIGH (ref 0–44)
AST: 62 U/L — ABNORMAL HIGH (ref 15–41)
Albumin: 3 g/dL — ABNORMAL LOW (ref 3.5–5.0)
Alkaline Phosphatase: 429 U/L — ABNORMAL HIGH (ref 38–126)
Anion gap: 13 (ref 5–15)
BUN: 13 mg/dL (ref 8–23)
CO2: 21 mmol/L — ABNORMAL LOW (ref 22–32)
Calcium: 9.2 mg/dL (ref 8.9–10.3)
Chloride: 98 mmol/L (ref 98–111)
Creatinine, Ser: 0.62 mg/dL (ref 0.44–1.00)
GFR calc Af Amer: >60 mL/min (ref >60)
GFR calc non Af Amer: >60 mL/min (ref >60)
Glucose, Bld: 198 mg/dL — ABNORMAL HIGH (ref 70–99)
Potassium: 3.6 mmol/L (ref 3.5–5.1)
Sodium: 132 mmol/L — ABNORMAL LOW (ref 135–145)
Total Bilirubin: 7.2 mg/dL — ABNORMAL HIGH (ref 0.3–1.2)
Total Protein: 7.1 g/dL (ref 6.5–8.1)

## 2018-11-29 LAB — CBC
HCT: 31.2 % — ABNORMAL LOW (ref 36.0–46.0)
Hemoglobin: 11.3 g/dL — ABNORMAL LOW (ref 12.0–15.0)
MCH: 31.2 pg (ref 26.0–34.0)
MCHC: 36.2 g/dL — ABNORMAL HIGH (ref 30.0–36.0)
MCV: 86.2 fL (ref 80.0–100.0)
Platelets: 538 10*3/uL — ABNORMAL HIGH (ref 150–400)
RBC: 3.62 MIL/uL — ABNORMAL LOW (ref 3.87–5.11)
RDW: 15.7 % — ABNORMAL HIGH (ref 11.5–15.5)
WBC: 6.1 10*3/uL (ref 4.0–10.5)
nRBC: 0 % (ref 0.0–0.2)

## 2018-11-29 LAB — URINE CULTURE: Culture: <10000 — AB

## 2018-11-29 LAB — LIPASE, BLOOD: Lipase: 44 U/L (ref 11–51)

## 2018-11-29 MED ORDER — IOHEXOL 300 MG/ML  SOLN
75.0000 mL | Freq: Once | INTRAMUSCULAR | Status: AC | PRN
  Administered 2018-11-29: 75 mL via INTRAVENOUS

## 2018-11-29 MED ORDER — DIPHENHYDRAMINE HCL 25 MG PO CAPS: 25.0000 mg | ORAL_CAPSULE | Freq: Every day | ORAL | Status: DC | PRN

## 2018-11-29 NOTE — H&P (View-Only) (Signed)
Patient ID: Kendra M Wuebker, female   DOB: 10/11/1935, 83 y.o.   MRN: [id number]    Progress Note   Subjective  Day # 2  CC: Jaundice and pancreatic mass  ERCP yesterday-short distal common bile duct stricture 5 French 10 cm stent placed and brushings done  Labs today T bili 7.2/alk phos 429/ALT 111/AST 62 WBC 6.1, hemoglobin 11.3 Lipase 44  Feels ok - she is tolerating a diet and wants to go home.   Objective   Vital signs in last 24 hours: Temp:  [98.2 F (36.8 C)-98.6 F (37 C)] 98.2 F (36.8 C) (10/04 0413) Pulse Rate:  [71-93] 71 (10/04 0413) Resp:  [14-21] 18 (10/04 0413) BP: (144-189)/(62-82) 147/62 (10/04 0413) SpO2:  [89 %-97 %] 93 % (10/04 0413) Weight:  [58.6 kg] 58.6 kg (10/03 2032)   General:    white female in NAD Heart:  Regular rate and rhythm; no murmurs Lungs: Respirations even and unlabored, lungs CTA bilaterally Abdomen:  Soft, nontender and nondistended. Normal bowel sounds. Extremities:  Without edema. Neurologic:  Alert and oriented,  grossly normal neurologically. Psych:  Cooperative. Normal mood and affect.  Intake/Output from previous day: 10/03 0701 - 10/04 0700 In: 724 [P.O.:360; I.V.:364] Out: 0   Lab Results: Recent Labs    11/26/18 1330 11/27/18 1440 11/29/18 0348  WBC 5.8 6.8 6.1  HGB 10.7* 11.7* 11.3*  HCT 31.8* 34.5* 31.2*  PLT 405.0* 504* 538*   BMET Recent Labs    11/28/18 0533 11/28/18 0744 11/29/18 0348  NA 128* 130* 132*  K 3.4* 3.2* 3.6  CL 94* 96* 98  CO2 22 20* 21*  GLUCOSE 207* 137* 198*  BUN 8 5* 13  CREATININE 0.54 0.56 0.62  CALCIUM 8.7* 8.8* 9.2   LFT Recent Labs    11/29/18 0348  PROT 7.1  ALBUMIN 3.0*  AST 62*  ALT 111*  ALKPHOS 429*  BILITOT 7.2*      Assessment / Plan:   Suspected pancreatic Cancer with treated obstructive jaundice   Home today I will take care of setting up endoscopic us and other f/u  Carl E. Gessner, MD, FACG [facility] 11/29/2018 1:59 PM  Pager [phone]    LOS: 2 days   Amy EsterwoodPA-C  11/29/2018, 10:35 AM  

## 2018-11-29 NOTE — Progress Notes (Signed)
Patient ID: [name], female   DOB: 09-13-1935, 83 y.o.   MRN: [id number]    Progress Note   Subjective  Day # 2  CC: Jaundice and pancreatic mass  ERCP yesterday-short distal common bile duct stricture 5 French 10 cm stent placed and brushings done  Labs today T bili 7.2/alk phos 429/ALT 111/AST 62 WBC 6.1, hemoglobin 11.3 Lipase 44  Feels ok - she is tolerating a diet and wants to go home.   Objective   Vital signs in last 24 hours: Temp:  [98.2 F (36.8 C)-98.6 F (37 C)] 98.2 F (36.8 C) (10/04 0413) Pulse Rate:  [71-93] 71 (10/04 0413) Resp:  [14-21] 18 (10/04 0413) BP: (144-189)/(62-82) 147/62 (10/04 0413) SpO2:  [89 %-97 %] 93 % (10/04 0413) Weight:  [58.6 kg] 58.6 kg (10/03 2032)   General:    white female in NAD Heart:  Regular rate and rhythm; no murmurs Lungs: Respirations even and unlabored, lungs CTA bilaterally Abdomen:  Soft, nontender and nondistended. Normal bowel sounds. Extremities:  Without edema. Neurologic:  Alert and oriented,  grossly normal neurologically. Psych:  Cooperative. Normal mood and affect.  Intake/Output from previous day: 10/03 0701 - 10/04 0700 In: 724 [P.O.:360; I.V.:364] Out: 0   Lab Results: Recent Labs    11/26/18 1330 11/27/18 1440 11/29/18 0348  WBC 5.8 6.8 6.1  HGB 10.7* 11.7* 11.3*  HCT 31.8* 34.5* 31.2*  PLT 405.0* 504* 538*   BMET Recent Labs    11/28/18 0533 11/28/18 0744 11/29/18 0348  NA 128* 130* 132*  K 3.4* 3.2* 3.6  CL 94* 96* 98  CO2 22 20* 21*  GLUCOSE 207* 137* 198*  BUN 8 5* 13  CREATININE 0.54 0.56 0.62  CALCIUM 8.7* 8.8* 9.2   LFT Recent Labs    11/29/18 0348  PROT 7.1  ALBUMIN 3.0*  AST 62*  ALT 111*  ALKPHOS 429*  BILITOT 7.2*      Assessment / Plan:   Suspected pancreatic Cancer with treated obstructive jaundice   Home today I will take care of setting up endoscopic [place] and other f/u  Gatha Mayer, MD, [facility] 11/29/2018 1:59 PM  Pager [phone]    LOS: 2 days   Amy Theressa Millard  11/29/2018, 10:35 AM

## 2018-11-29 NOTE — Progress Notes (Signed)
[name] to be discharged Home per MD order. Discussed medications and follow up appointments with the patient.  Medication list explained in detail. Patient verbalized understanding.  Skin clean, dry and intact without evidence of skin break down, no evidence of skin tears noted. IV catheter discontinued intact. Site without signs and symptoms of complications. Dressing and pressure applied. Pt denies pain at the site currently. No complaints noted.  Patient free of lines, drains, and wounds.   An After Visit Summary (AVS) was printed and given to the patient. Patient escorted via wheelchair, and discharged home via private auto.  Amaryllis Dyke, RN

## 2018-11-29 NOTE — Discharge Summary (Signed)
Physician Discharge Summary  [name] [id number] DOB: 23-Aug-1935 DOA: 11/27/2018  PCP: Lucille Passy, MD  Admit date: 11/27/2018 Discharge date: 11/29/2018  Admitted From: Home Disposition: Home  Recommendations for Outpatient Follow-up:  1. Follow up with PCP in 1 week 2. Follow-up CBC and CMP 3. Patient had abnormal TSH which was ordered to evaluate for cause of hyponatremia.  Recommend follow-up 4. Follow-up with GI  Home Health: No Equipment/Devices: No  Discharge Condition: Stable  CODE STATUS: Full Diet recommendation: Heart healthy  Brief/Interim Summary: This is an 83 year old female with past medical history of CAD status post CABG, hypertension who presented to the ED with worsening abdominal pain and itching found to have significantly abnormal LFTs by PCP and sent to [facility].  She was found to have a pancreatic mass with obstructive jaundice and underwent ERCP with stent on 11/28/2018.    1. Pancreatic mass with obstructive jaundice consistent with pancreatic adenocarcinoma s/p ERCP with stent (11/28/2018).  1. Will need an EUS, oncology and surgical consult outpatient 2. Follow-up with pathology 3. Follow-up with GI 4. Follow-up CMP in 1 week 2. Hyponatremia in setting of decreased p.o. 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 intake. sOsm 272, urine Osm 608, urine Na 73. Serum Na 127->132. 1. Follow up outpatient 3. Hyperthyroid asymptomatic. TSH 0.281. T4 ordered but patient left prior to obtaining lab 1. Follow up outpatient 4. CAD 1. Continue statin and aspirin 5. Hypertension 1. Continue home meds 6. Diabetes HA1c 7.5. patient tolerated sliding scale. 1. Follow up outpatient  Discharge Diagnoses:  Principal Problem:   Pancreatic mass Active Problems:   CAD (coronary artery disease)   OSA on CPAP   HTN (hypertension)   Obstructive jaundice due to malignant neoplasm [facility])  Physical Exam Vitals  signs and nursing note reviewed.  Constitutional:      General: She is not in acute distress.    Appearance: Normal appearance.  HENT:     Head: Normocephalic and atraumatic.     Mouth/Throat:     Mouth: Mucous membranes are moist.  Eyes:     Extraocular Movements: Extraocular movements intact.  Cardiovascular:     Rate and Rhythm: Normal rate and regular rhythm.  Pulmonary:     Effort: Pulmonary effort is normal.     Breath sounds: Normal breath sounds.  Abdominal:     General: Abdomen is flat. Bowel sounds are normal.  Musculoskeletal: Normal range of motion.        General: No swelling.  Skin:    Coloration: Skin is jaundiced.  Neurological:     General: No focal deficit present.     Mental Status: She is alert. Mental status is at baseline.  Psychiatric:        Mood and Affect: Mood normal.        Behavior: Behavior normal.        Thought Content: Thought content normal.        Judgment: Judgment normal.      Discharge Instructions  Discharge Instructions    Diet - low sodium heart healthy   Complete by: As directed    Discharge instructions   Complete by: As directed    You were seen in the hospital for jaundice and severe itching.  You underwent an ERCP and stent by GI to remove the blockage from a pancreatic mass.  You are being discharged in stable condition -Follow-up with your GI doctor as an outpatient -Follow-up with  your PCP in 1 week -Get repeat lab work prior to your PCP appointment   Increase activity slowly   Complete by: As directed      Allergies as of 11/29/2018      Reactions   Levaquin [levofloxacin In D5w]    Elevated BP   Choline Fenofibrate Other (See Comments)    pt states INTOL to Trilipix w/ "thigh burning"   Hctz [hydrochlorothiazide]    Causes hyponatremia   Simvastatin Other (See Comments)    pt states INTOL to STATINS \\T \ refuses to restart   Adhesive [tape] Rash   Bentyl [dicyclomine Hcl] Other (See Comments)   "Made me feel  weird and drained me"   Ceclor [cefaclor] Rash   Clarithromycin Rash   Codeine Nausea Only   Doxycycline Rash   Hydrocodone    Nightmare after taking cough syrup w/hydrocodone   Lisinopril Cough   Developed ACE cough...   Penicillins Itching, Rash, Other (See Comments)   At injection site Did it involve swelling of the face/tongue/throat, SOB, or low BP? No Did it involve sudden or severe rash/hives, skin peeling, or any reaction on the inside of your mouth or nose? No Did you need to seek medical attention at a hospital or doctor's office? No When did it last happen?"It was a long time ago" If all above answers are "NO", may proceed with cephalosporin use.   Tobramycin-dexamethasone Rash          Follow-up Information    Stark Klein, MD. Call in 1 day(s).   Specialty: General Surgery Why: Please call to schedule an appointment  Contact information: [address] [place] [address] [phone]          Allergies  Allergen Reactions  . Levaquin [Levofloxacin In D5w]     Elevated BP  . Choline Fenofibrate Other (See Comments)     pt states INTOL to Trilipix w/ "thigh burning"  . Hctz [Hydrochlorothiazide]     Causes hyponatremia  . Simvastatin Other (See Comments)     pt states INTOL to STATINS \\T \ refuses to restart  . Adhesive [Tape] Rash  . Bentyl [Dicyclomine Hcl] Other (See Comments)    "Made me feel weird and drained me"  . Ceclor [Cefaclor] Rash  . Clarithromycin Rash  . Codeine Nausea Only  . Doxycycline Rash  . Hydrocodone     Nightmare after taking cough syrup w/hydrocodone  . Lisinopril Cough    Developed ACE cough...  . Penicillins Itching, Rash and Other (See Comments)    At injection site Did it involve swelling of the face/tongue/throat, SOB, or low BP? No Did it involve sudden or severe rash/hives, skin peeling, or any reaction on the inside of your mouth or nose? No Did you need to seek medical attention at a hospital or  doctor's office? No When did it last happen?"It was a long time ago" If all above answers are "NO", may proceed with cephalosporin use.   . Tobramycin-Dexamethasone Rash    Consultations: Dr. Carlean Purl - [facility]  Procedures/Studies: Dg Chest 2 View  Result Date: 11/27/2018 CLINICAL DATA:  Chronic cough. EXAM: CHEST - 2 VIEW COMPARISON:  Radiographs of June 19, 2017. FINDINGS: The heart size and mediastinal contours are within normal limits. Both lungs are clear. Status post coronary bypass graft. No pneumothorax or pleural effusion is noted. The visualized skeletal structures are unremarkable. IMPRESSION: No active cardiopulmonary disease. Electronically Signed   By: Bobbe Medico.D.  On: 11/27/2018 16:34   Ct Chest W Contrast  Result Date: 11/29/2018 CLINICAL DATA:  Inpatient. Pancreatic mass concerning for malignancy on recent CT abdomen study. Staging evaluation of the chest requested. EXAM: CT CHEST WITH CONTRAST TECHNIQUE: Multidetector CT imaging of the chest was performed during intravenous contrast administration. CONTRAST:  14mL OMNIPAQUE IOHEXOL 300 MG/ML  SOLN COMPARISON:  11/27/2018 CT abdomen/pelvis and chest radiograph. FINDINGS: Cardiovascular: Normal heart size. No significant pericardial effusion/thickening. Left anterior descending coronary atherosclerosis status post CABG. Atherosclerotic nonaneurysmal thoracic aorta. Normal caliber pulmonary arteries. No central pulmonary emboli. Mediastinum/Nodes: No discrete thyroid nodules. Unremarkable esophagus. No pathologically enlarged axillary, mediastinal or hilar lymph nodes. Lungs/Pleura: No pneumothorax. No pleural effusion. Calcified subcentimeter peripheral left lower lobe granuloma. No acute consolidative airspace disease, lung masses or significant pulmonary nodules. Upper abdomen: Expected pneumobilia in the intrahepatic bile ducts status post CBD stent placement at ERCP 1 day prior. Main pancreatic duct dilation up to 9  mm in the pancreatic body as seen on recent CT abdomen study. Subcentimeter hypodense splenic lesions too small to characterize and unchanged. Musculoskeletal: No aggressive appearing focal osseous lesions. Intact sternotomy wires. Marked thoracic spondylosis. IMPRESSION: 1. No evidence of metastatic disease in the chest. 2. Expected pneumobilia in the intrahepatic bile ducts status post CBD stent placement ERCP 1 day prior. Redemonstration of main pancreatic duct dilation due to known pancreatic head mass. Aortic Atherosclerosis (ICD10-I70.0). Electronically Signed   By: Ilona Sorrel M.D.   On: 11/29/2018 10:24   Ct Abdomen Pelvis W Contrast  Result Date: 11/27/2018 CLINICAL DATA:  Abnormal ultrasound, biliary ductal dilatation, assess for pancreatic mass EXAM: CT ABDOMEN AND PELVIS WITH CONTRAST TECHNIQUE: Multidetector CT imaging of the abdomen and pelvis was performed using the standard protocol following bolus administration of intravenous contrast. CONTRAST:  32mL OMNIPAQUE IOHEXOL 300 MG/ML SOLN, 4mL OMNIPAQUE IOHEXOL 300 MG/ML SOLN, additional oral enteric contrast COMPARISON:  Same day right upper quadrant ultrasound, 11/27/2018, CT abdomen pelvis, 05/04/2014 FINDINGS: Lower chest: No acute abnormality. Calcification within the left breast. Hepatobiliary: No solid liver abnormality is seen. There is gross intrahepatic and extrahepatic biliary ductal dilatation with abrupt cut off of the distal common bile duct in the pancreatic head. Distention of the gallbladder. Pancreas: There is an ill-defined, hypodense mass of the pancreatic head measuring 1.9 x 1.6 x 1.6 cm (series 2, image 26, series 5, image 35). Gross dilatation of the pancreatic duct with abrupt cut off in the pancreatic head at the level of the mass. There may be minimal abutment of the SMV near the portal confluence without definite contact or encasement (series 2, image 23, series 5, image 34). No abutment or encasement of other adjacent  vessels. Spleen: Normal in size without significant abnormality. Adrenals/Urinary Tract: Adrenal glands are unremarkable. Kidneys are normal, without renal calculi, solid lesion, or hydronephrosis. Bladder is unremarkable. Stomach/Bowel: Stomach is within normal limits. Incidental diverticulum of the descending portion of the duodenum. Appendix is very diminutive (series 2, image 12). No evidence of bowel wall thickening, distention, or inflammatory changes. Sigmoid diverticulosis. Vascular/Lymphatic: Aortic atherosclerosis. No enlarged abdominal or pelvic lymph nodes. Reproductive: No mass or other significant abnormality. The left ovary appears to be surgically absent. Other: No abdominal wall hernia or abnormality. No abdominopelvic ascites. Musculoskeletal: No acute or significant osseous findings. IMPRESSION: 1. There is an ill-defined, hypodense mass of the pancreatic head measuring 1.9 x 1.6 x 1.6 cm (series 2, image 26, series 5, image 35) with gross biliary and pancreatic ductal dilatation. Findings  are consistent with pancreatic adenocarcinoma. There may be minimal abutment of the SMV near the portal confluence without definite contact or encasement (series 2, image 23, series 5, image 34). No abutment or encasement of other adjacent vessels. Pancreatic protocol MRI may be helpful to better assess for resectability if desired. 2.  No evidence of abdominal lymphadenopathy or metastatic disease. 3. Other chronic and incidental findings as detailed above. Aortic Atherosclerosis (ICD10-I70.0). Electronically Signed   By: Eddie Candle M.D.   On: 11/27/2018 21:06   Dg Ercp  Result Date: 11/28/2018 CLINICAL DATA:  83 year old female with a history of biliary obstruction EXAM: ERCP TECHNIQUE: Multiple spot images obtained with the fluoroscopic device and submitted for interpretation post-procedure. FLUOROSCOPY TIME:  Fluoroscopy Time: 1 minutes 35 seconds COMPARISON:  None FINDINGS: Limited images during ERCP.  Initial image demonstrates endoscope projecting over the upper abdomen. There is then ampullary cannulation with a safety wire and partial opacification of the extrahepatic biliary system. Abrupt cutoff of the contrast column compatible with findings on prior CT. Final image demonstrates partial decompressed system with plastic biliary stent in position. IMPRESSION: Limited images during ERCP demonstrates treatment of bile duct obstruction with placement of a plastic biliary stent. Please refer to the dictated operative report for full details of intraoperative findings and procedure. Electronically Signed   By: Corrie Mckusick D.O.   On: 11/28/2018 14:40   US Abdomen Limited Ruq  Result Date: 11/27/2018 CLINICAL DATA:  History of gallstones, jaundice, upper abdominal pain, nausea. EXAM: ULTRASOUND ABDOMEN LIMITED RIGHT UPPER QUADRANT COMPARISON:  Abdominal ultrasound 12/08/2015 FINDINGS: Gallbladder: The gallbladder is distended. There is a mild amount of sludge. No gallbladder wall thickening. No sonographic Murphy sign noted by sonographer. Common bile duct: Diameter: 1.3 cm Liver: No focal lesion identified. Within normal limits in parenchymal echogenicity. Portal vein is patent on color Doppler imaging with normal direction of blood flow towards the liver. There is intrahepatic biliary duct dilation. Other: There is suggestion of a mass at the head of the pancreas measuring 2.6 x 2.8 by 3.1 cm. The main pancreatic duct is dilated measuring 1.1 cm. IMPRESSION: 1.  Intra and extrahepatic biliary duct dilation. 2. Suggestion of a mass at the head of the pancreas as well as dilation of the main pancreatic duct. Further evaluation with cross-sectional imaging is recommended. 3. Distended gallbladder with a mild amount of sludge. No significant inflammatory changes. Electronically Signed   By: Audie Pinto M.D.   On: 11/27/2018 16:33     Subjective:   Discharge Exam: Vitals:   11/29/18 0413 11/29/18  0945  BP: (!) 147/62 140/69  Pulse: 71 74  Resp: 18 18  Temp: 98.2 F (36.8 C) 98 F (36.7 C)  SpO2: 93% 96%   Vitals:   11/28/18 1834 11/28/18 2032 11/29/18 0413 11/29/18 0945  BP: (!) 144/73 (!) 155/82 (!) 147/62 140/69  Pulse: 93 88 71 74  Resp:  16 18 18   Temp:  98.2 F (36.8 C) 98.2 F (36.8 C) 98 F (36.7 C)  TempSrc:  Oral  Oral  SpO2:  91% 93% 96%  Weight:  58.6 kg    Height:        General: Pt is alert, awake, not in acute distress Cardiovascular: RRR, S1/S2 +, no rubs, no gallops Respiratory: CTA bilaterally, no wheezing, no rhonchi Abdominal: Soft, NT, ND, bowel sounds + Extremities: no edema, no cyanosis    The results of significant diagnostics from this hospitalization (including imaging, microbiology, ancillary and laboratory)  are listed below for reference.     Microbiology: Recent Results (from the past 240 hour(s))  Novel Coronavirus, NAA ([facility])     Status: None   Collection Time: 11/23/18 12:00 AM   Specimen: Oropharyngeal(OP) collection in vial transport medium   OROPHARYNGEA  TESTING  Result Value Ref Range Status   SARS-CoV-2, NAA Not Detected Not Detected Final    Comment: This nucleic acid amplification test was developed and its performance characteristics determined by [employer]. Nucleic acid amplification tests include PCR and TMA. This test has not been FDA cleared or approved. This test has been authorized by FDA under an Emergency Use Authorization (EUA). This test is only authorized for the duration of time the declaration that circumstances exist justifying the authorization of the emergency use of in vitro diagnostic tests for detection of SARS-CoV-2 virus and/or diagnosis of COVID-19 infection under section 564(b)(1) of the Act, 21 U.S.C. PT:[id number]) (1), unless the authorization is terminated or revoked sooner. When diagnostic testing is negative, the possibility of a false negative result should be considered in  the context of a patient's recent exposures and the presence of clinical signs and symptoms consistent with COVID-19. An individual without symptoms of COVID-19 and who is not shedding SARS-CoV-2 virus would  expect to have a negative (not detected) result in this assay.   Urine culture     Status: Abnormal   Collection Time: 11/27/18  2:51 PM   Specimen: Urine, Random  Result Value Ref Range Status   Specimen Description   Final    URINE, RANDOM Performed at [facility], [address] [place], [place] [address]    Special Requests   Final    NONE Performed at [facility], [address] [place], [place] [address]    Culture (A)  Final    <10,000 COLONIES/mL INSIGNIFICANT GROWTH Performed at [facility], [place] [address] [place], [place] [address]    Report Status 11/29/2018 FINAL  Final  SARS CORONAVIRUS 2 (TAT 6-24 HRS) Nasopharyngeal Nasopharyngeal Swab     Status: None   Collection Time: 11/28/18  1:45 AM   Specimen: Nasopharyngeal Swab  Result Value Ref Range Status   SARS Coronavirus 2 NEGATIVE NEGATIVE Final    Comment: (NOTE) SARS-CoV-2 target nucleic acids are NOT DETECTED. The SARS-CoV-2 RNA is generally detectable in upper and lower respiratory specimens during the acute phase of infection. Negative results do not preclude SARS-CoV-2 infection, do not rule out co-infections with other pathogens, and should not be used as the sole basis for treatment or other patient management decisions. Negative results must be combined with clinical observations, patient history, and epidemiological information. The expected result is Negative. Fact Sheet for Patients: SugarRoll.be Fact Sheet for Healthcare Providers: https://www.woods-mathews.com/ This test is not yet approved or cleared by the [place] FDA and  has been authorized for detection and/or diagnosis of SARS-CoV-2 by FDA under an  Emergency Use Authorization (EUA). This EUA will remain  in effect (meaning this test can be used) for the duration of the COVID-19 declaration under Section 56 4(b)(1) of the Act, 21 U.S.C. section 360bbb-3(b)(1), unless the authorization is terminated or revoked sooner. Performed at [facility], [place] [address], [place], [place] [address]      Labs: BNP (last 3 results) No results for input(s): BNP in the last 8760 hours. Basic Metabolic Panel: Recent Labs  Lab 11/27/18 1440 11/28/18 0036 11/28/18 0533 11/28/18 0744 11/29/18  0348  NA 129* 130* 128* 130* 132*  K 3.7 5.9* 3.4* 3.2* 3.6  CL 92* 94* 94* 96* 98  CO2 26 25 22  20* 21*  GLUCOSE 312* 252* 207* 137* 198*  BUN 11 8 8  5* 13  CREATININE 0.43* 0.50 0.54 0.56 0.62  CALCIUM 9.5 9.1 8.7* 8.8* 9.2  MG  --   --   --  1.8  --   PHOS  --   --   --  3.9  --    Liver Function Tests: Recent Labs  Lab 11/26/18 1330 11/27/18 1440 11/28/18 0036 11/29/18 0348  AST 63* 84* 94* 62*  ALT 114* 132* 122* 111*  ALKPHOS 409* 451* 429* 429*  BILITOT 7.5* 9.1* 12.1* 7.2*  PROT 7.4 8.1 7.2 7.1  ALBUMIN 3.9 3.8 3.3* 3.0*   Recent Labs  Lab 11/27/18 1440 11/29/18 0348  LIPASE 20 44   Recent Labs  Lab 11/26/18 1330 11/27/18 1517  AMMONIA 44* 33   CBC: Recent Labs  Lab 11/26/18 1330 11/27/18 1440 11/29/18 0348  WBC 5.8 6.8 6.1  NEUTROABS 2.6 3.5  --   HGB 10.7* 11.7* 11.3*  HCT 31.8* 34.5* 31.2*  MCV 92.0 89.6 86.2  PLT 405.0* 504* 538*   Cardiac Enzymes: No results for input(s): CKTOTAL, CKMB, CKMBINDEX, TROPONINI in the last 168 hours. BNP: Invalid input(s): POCBNP CBG: Recent Labs  Lab 11/28/18 2033 11/29/18 0013 11/29/18 0412 11/29/18 0717 11/29/18 1118  GLUCAP 303* 345* 198* 165* 322*   D-Dimer No results for input(s): DDIMER in the last 72 hours. Hgb A1c Recent Labs    11/28/18 0036  HGBA1C 7.5*   Lipid Profile No results for input(s): CHOL, HDL, LDLCALC, TRIG, CHOLHDL, LDLDIRECT in  the last 72 hours. Thyroid function studies Recent Labs    11/29/18 0348  TSH 0.281*   Anemia work up No results for input(s): VITAMINB12, FOLATE, FERRITIN, TIBC, IRON, RETICCTPCT in the last 72 hours. Urinalysis    Component Value Date/Time   COLORURINE YELLOW 11/27/2018 1451   APPEARANCEUR CLEAR 11/27/2018 1451   LABSPEC <1.005 (L) 11/27/2018 1451   PHURINE 6.5 11/27/2018 1451   GLUCOSEU >=500 (A) 11/27/2018 1451   GLUCOSEU >=1000 (A) 11/26/2018 1330   HGBUR TRACE (A) 11/27/2018 1451   BILIRUBINUR SMALL (A) 11/27/2018 1451   BILIRUBINUR neg 11/01/2015 1650   KETONESUR NEGATIVE 11/27/2018 1451   PROTEINUR NEGATIVE 11/27/2018 1451   UROBILINOGEN 0.2 11/26/2018 1330   NITRITE NEGATIVE 11/27/2018 1451   LEUKOCYTESUR NEGATIVE 11/27/2018 1451   Sepsis Labs Invalid input(s): PROCALCITONIN,  WBC,  LACTICIDVEN Microbiology Recent Results (from the past 240 hour(s))  Novel Coronavirus, NAA ([facility])     Status: None   Collection Time: 11/23/18 12:00 AM   Specimen: Oropharyngeal(OP) collection in vial transport medium   OROPHARYNGEA  TESTING  Result Value Ref Range Status   SARS-CoV-2, NAA Not Detected Not Detected Final    Comment: This nucleic acid amplification test was developed and its performance characteristics determined by [employer]. Nucleic acid amplification tests include PCR and TMA. This test has not been FDA cleared or approved. This test has been authorized by FDA under an Emergency Use Authorization (EUA). This test is only authorized for the duration of time the declaration that circumstances exist justifying the authorization of the emergency use of in vitro diagnostic tests for detection of SARS-CoV-2 virus and/or diagnosis of COVID-19 infection under section 564(b)(1) of the Act, 21 U.S.C. [id number]) (1), unless the authorization is terminated or revoked sooner.  When diagnostic testing is negative, the possibility of a false negative result  should be considered in the context of a patient's recent exposures and the presence of clinical signs and symptoms consistent with COVID-19. An individual without symptoms of COVID-19 and who is not shedding SARS-CoV-2 virus would  expect to have a negative (not detected) result in this assay.   Urine culture     Status: Abnormal   Collection Time: 11/27/18  2:51 PM   Specimen: Urine, Random  Result Value Ref Range Status   Specimen Description   Final    URINE, RANDOM Performed at [facility], [address] [place], [place] [address]    Special Requests   Final    NONE Performed at [facility], [address] [place], [place] [address]    Culture (A)  Final    <10,000 COLONIES/mL INSIGNIFICANT GROWTH Performed at [facility], [place] [address] [place], [place] [address]    Report Status 11/29/2018 FINAL  Final  SARS CORONAVIRUS 2 (TAT 6-24 HRS) Nasopharyngeal Nasopharyngeal Swab     Status: None   Collection Time: 11/28/18  1:45 AM   Specimen: Nasopharyngeal Swab  Result Value Ref Range Status   SARS Coronavirus 2 NEGATIVE NEGATIVE Final    Comment: (NOTE) SARS-CoV-2 target nucleic acids are NOT DETECTED. The SARS-CoV-2 RNA is generally detectable in upper and lower respiratory specimens during the acute phase of infection. Negative results do not preclude SARS-CoV-2 infection, do not rule out co-infections with other pathogens, and should not be used as the sole basis for treatment or other patient management decisions. Negative results must be combined with clinical observations, patient history, and epidemiological information. The expected result is Negative. Fact Sheet for Patients: SugarRoll.be Fact Sheet for Healthcare Providers: https://www.woods-mathews.com/ This test is not yet approved or cleared by the [place] FDA and  has been authorized for detection and/or diagnosis of SARS-CoV-2  by FDA under an Emergency Use Authorization (EUA). This EUA will remain  in effect (meaning this test can be used) for the duration of the COVID-19 declaration under Section 56 4(b)(1) of the Act, 21 U.S.C. section 360bbb-3(b)(1), unless the authorization is terminated or revoked sooner. Performed at [facility], [place] [address], [place], [place] [address]      Time coordinating discharge: Over 30 minutes  SIGNED:   Harold Hedge, D.O. [facility] 11/29/2018, 2:27 PM

## 2018-11-30 ENCOUNTER — Other Ambulatory Visit: Payer: Medicare Other

## 2018-11-30 ENCOUNTER — Telehealth: Payer: Self-pay

## 2018-11-30 ENCOUNTER — Encounter (HOSPITAL_COMMUNITY): Payer: Self-pay | Admitting: Internal Medicine

## 2018-11-30 ENCOUNTER — Other Ambulatory Visit: Payer: Self-pay

## 2018-11-30 LAB — CANCER ANTIGEN 19-9: CA 19-9: 4 U/mL (ref 0–35)

## 2018-11-30 NOTE — Telephone Encounter (Signed)
Copied from [place] [id number]. Topic: General - Inquiry >> Nov 30, 2018  9:25 AM Virl Axe D wrote: Reason for CRM: Pt stated she was just released from the hospital last night. She has a lab appt today at 1:45 and would like to know if Dr. Deborra Medina needs her to have labs done today or could she wait a few days. Requesting CB. [phone]

## 2018-12-01 ENCOUNTER — Other Ambulatory Visit: Payer: Self-pay | Admitting: Internal Medicine

## 2018-12-01 DIAGNOSIS — R6889 Other general symptoms and signs: Secondary | ICD-10-CM

## 2018-12-01 DIAGNOSIS — K831 Obstruction of bile duct: Secondary | ICD-10-CM

## 2018-12-01 DIAGNOSIS — K8689 Other specified diseases of pancreas: Secondary | ICD-10-CM

## 2018-12-01 DIAGNOSIS — D649 Anemia, unspecified: Secondary | ICD-10-CM

## 2018-12-01 LAB — CYTOLOGY - NON PAP

## 2018-12-01 NOTE — Telephone Encounter (Signed)
TA- please see message below

## 2018-12-01 NOTE — Progress Notes (Signed)
I called patient and explained that cytology did not prove cancer. Her CA 19-9 is only 4 also  She is feeling "droopy" and also still itching.  I ordered labs for tomorrow (CMET, lipase, CBC, ferritin and B12 - is mildly anemic) and she will come  She is aware we will be calling her for the EUS appt - Dr. Clare Charon about this but I am ccing sheri also as Patty out through 10/9 - Dr. Ardis Hughs had suggested adding her in and moving a colonoscopy patient from 10/15

## 2018-12-02 ENCOUNTER — Telehealth: Payer: Self-pay

## 2018-12-02 ENCOUNTER — Other Ambulatory Visit: Payer: Self-pay

## 2018-12-02 ENCOUNTER — Other Ambulatory Visit (INDEPENDENT_AMBULATORY_CARE_PROVIDER_SITE_OTHER): Payer: Medicare Other

## 2018-12-02 ENCOUNTER — Encounter: Payer: Medicare Other | Admitting: Physical Therapy

## 2018-12-02 DIAGNOSIS — K8689 Other specified diseases of pancreas: Secondary | ICD-10-CM

## 2018-12-02 DIAGNOSIS — D649 Anemia, unspecified: Secondary | ICD-10-CM | POA: Diagnosis not present

## 2018-12-02 DIAGNOSIS — R6889 Other general symptoms and signs: Secondary | ICD-10-CM

## 2018-12-02 DIAGNOSIS — K831 Obstruction of bile duct: Secondary | ICD-10-CM | POA: Diagnosis not present

## 2018-12-02 LAB — CBC
HCT: 32.4 % — ABNORMAL LOW (ref 36.0–46.0)
Hemoglobin: 10.9 g/dL — ABNORMAL LOW (ref 12.0–15.0)
MCHC: 33.5 g/dL (ref 30.0–36.0)
MCV: 92.1 fl (ref 78.0–100.0)
Platelets: 576 10*3/uL — ABNORMAL HIGH (ref 150.0–400.0)
RBC: 3.52 Mil/uL — ABNORMAL LOW (ref 3.87–5.11)
RDW: 14.8 % (ref 11.5–15.5)
WBC: 9.7 10*3/uL (ref 4.0–10.5)

## 2018-12-02 LAB — COMPREHENSIVE METABOLIC PANEL
ALT: 79 U/L — ABNORMAL HIGH (ref 0–35)
AST: 50 U/L — ABNORMAL HIGH (ref 0–37)
Albumin: 3.7 g/dL (ref 3.5–5.2)
Alkaline Phosphatase: 298 U/L — ABNORMAL HIGH (ref 39–117)
BUN: 11 mg/dL (ref 6–23)
CO2: 29 mEq/L (ref 19–32)
Calcium: 9.4 mg/dL (ref 8.4–10.5)
Chloride: 94 mEq/L — ABNORMAL LOW (ref 96–112)
Creatinine, Ser: 0.68 mg/dL (ref 0.40–1.20)
GFR: 82.99 mL/min (ref >60.00)
Glucose, Bld: 228 mg/dL — ABNORMAL HIGH (ref 70–99)
Potassium: 4.9 mEq/L (ref 3.5–5.1)
Sodium: 130 mEq/L — ABNORMAL LOW (ref 135–145)
Total Bilirubin: 3.2 mg/dL — ABNORMAL HIGH (ref 0.2–1.2)
Total Protein: 7 g/dL (ref 6.0–8.3)

## 2018-12-02 LAB — VITAMIN B12: Vitamin B-12: >1500 pg/mL — ABNORMAL HIGH (ref 211–911)

## 2018-12-02 LAB — LIPASE: Lipase: 23 U/L (ref 11.0–59.0)

## 2018-12-02 LAB — FERRITIN: Ferritin: 443.1 ng/mL — ABNORMAL HIGH (ref 10.0–291.0)

## 2018-12-02 NOTE — Progress Notes (Signed)
Let patient know labs show stent is working  Not sure why she feels "droopy"  Hopefully itching will improve soon  She should check blood pressure at home - if it is low she should check with PCP and may be able to adjust BP meds

## 2018-12-02 NOTE — Telephone Encounter (Signed)
Patient has been scheduled to [facility] 12/10/18 7:30.  She verbalized understanding of all verbal instructions.  She understands to be NPO after midnight except to take her medications with a sip of water that morning.  She will arrive at 6:00 in admitting.

## 2018-12-03 ENCOUNTER — Encounter: Payer: Self-pay | Admitting: Family Medicine

## 2018-12-03 ENCOUNTER — Ambulatory Visit (INDEPENDENT_AMBULATORY_CARE_PROVIDER_SITE_OTHER): Payer: Medicare Other | Admitting: Family Medicine

## 2018-12-03 ENCOUNTER — Other Ambulatory Visit: Payer: Self-pay

## 2018-12-03 VITALS — BP 144/68 | HR 62

## 2018-12-03 DIAGNOSIS — E139 Other specified diabetes mellitus without complications: Secondary | ICD-10-CM | POA: Diagnosis not present

## 2018-12-03 DIAGNOSIS — L299 Pruritus, unspecified: Secondary | ICD-10-CM | POA: Diagnosis not present

## 2018-12-03 DIAGNOSIS — C801 Malignant (primary) neoplasm, unspecified: Secondary | ICD-10-CM

## 2018-12-03 DIAGNOSIS — K8689 Other specified diseases of pancreas: Secondary | ICD-10-CM | POA: Diagnosis not present

## 2018-12-03 DIAGNOSIS — I739 Peripheral vascular disease, unspecified: Secondary | ICD-10-CM

## 2018-12-03 DIAGNOSIS — I25701 Atherosclerosis of coronary artery bypass graft(s), unspecified, with angina pectoris with documented spasm: Secondary | ICD-10-CM

## 2018-12-03 DIAGNOSIS — J441 Chronic obstructive pulmonary disease with (acute) exacerbation: Secondary | ICD-10-CM

## 2018-12-03 HISTORY — DX: Other specified diabetes mellitus without complications: E13.9

## 2018-12-03 NOTE — Assessment & Plan Note (Signed)
She has EUS/biopsy scheduled for 10/15 and follow up with Dr. Fuller Plan 12/22/18 3:40.  We therefore moved her follow up with me to 10/21 (after her biopsy).

## 2018-12-03 NOTE — Progress Notes (Signed)
TELEPHONE ENCOUNTER   Patient verbally agreed to telephone visit and is aware that copayment and coinsurance may apply. Patient was treated using telemedicine according to accepted telemedicine protocols. Location of the patient: patient's home  Location of provider: provider's home Names of all persons participating in the telemedicine service and role in the encounter: Arnette Norris, MD  Laren Boom, [name] (her daughter)  Subjective:   Chief Complaint  Patient presents with   Hyperglycemia    Pt had a read of 220 for her blood glucose yesterday.     HPI   I saw pt for virtual visit on 11/26/18- at that time, she stated:  Pruritis on upper body and hands- Started last week.    Saw Dr. Caryl Bis on 11/21/18 for HA, cough, fever, sinus issues.   Note reviewed-  He advised quarantine precautions and sent her for covid testing, which was negative.  Since she was having itching without a rash and dry mouth, I asked her to come in for labs to rule out a liver issue/pancreatic issue- Labs did show the following:  UA- glucose > 1000  Liver function very high, ammonia slightly elevated, sodium and thyroid function off.  I therefore advised her to go to ED for fluids, new onset diabetes and further work up as to what was causing this.  She was admitted from 10/2- 11/29/18- notes reviewed.  Itching is some better today.  Hepatic Function Latest Ref Rng & Units 12/02/2018 11/29/2018 11/28/2018  Total Protein 6.0 - 8.3 g/dL 7.0 7.1 7.2  Albumin 3.5 - 5.2 g/dL 3.7 3.0(L) 3.3(L)  AST 0 - 37 U/L 50(H) 62(H) 94(H)  ALT 0 - 35 U/L 79(H) 111(H) 122(H)  Alk Phosphatase 39 - 117 U/L 298(H) 429(H) 429(H)  Total Bilirubin 0.2 - 1.2 mg/dL 3.2(H) 7.2(H) 12.1(H)  Bilirubin, Direct 0.0 - 0.3 mg/dL - - -   Lab Results  Component Value Date   WBC 9.7 12/02/2018   HGB 10.9 (L) 12/02/2018   HCT 32.4 (L) 12/02/2018   MCV 92.1 12/02/2018   PLT 576.0 (H) 12/02/2018     Hepatic Function  Latest Ref Rng & Units 12/02/2018 11/29/2018 11/28/2018  Total Protein 6.0 - 8.3 g/dL 7.0 7.1 7.2  Albumin 3.5 - 5.2 g/dL 3.7 3.0(L) 3.3(L)  AST 0 - 37 U/L 50(H) 62(H) 94(H)  ALT 0 - 35 U/L 79(H) 111(H) 122(H)  Alk Phosphatase 39 - 117 U/L 298(H) 429(H) 429(H)  Total Bilirubin 0.2 - 1.2 mg/dL 3.2(H) 7.2(H) 12.1(H)  Bilirubin, Direct 0.0 - 0.3 mg/dL - - -   Unfortunately obstructing pancreatic mass was found on imaging.  Brushing of bile duct revealed neg cytology and CA 9- 19 was 4.  Scheduled for EUS/biopsy done by Dr. Carlean Purl at [facility] on 12/10/18.  Dg Chest 2 View  Result Date: 11/27/2018 CLINICAL DATA:  Chronic cough. EXAM: CHEST - 2 VIEW COMPARISON:  Radiographs of June 19, 2017. FINDINGS: The heart size and mediastinal contours are within normal limits. Both lungs are clear. Status post coronary bypass graft. No pneumothorax or pleural effusion is noted. The visualized skeletal structures are unremarkable. IMPRESSION: No active cardiopulmonary disease. Electronically Signed   By: Marijo Conception M.D.   On: 11/27/2018 16:34   Ct Chest W Contrast  Result Date: 11/29/2018 CLINICAL DATA:  Inpatient. Pancreatic mass concerning for malignancy on recent CT abdomen study. Staging evaluation of the chest requested. EXAM: CT CHEST WITH CONTRAST TECHNIQUE: Multidetector CT imaging of the chest was performed during  intravenous contrast administration. CONTRAST:  58m OMNIPAQUE IOHEXOL 300 MG/ML  SOLN COMPARISON:  11/27/2018 CT abdomen/pelvis and chest radiograph. FINDINGS: Cardiovascular: Normal heart size. No significant pericardial effusion/thickening. Left anterior descending coronary atherosclerosis status post CABG. Atherosclerotic nonaneurysmal thoracic aorta. Normal caliber pulmonary arteries. No central pulmonary emboli. Mediastinum/Nodes: No discrete thyroid nodules. Unremarkable esophagus. No pathologically enlarged axillary, mediastinal or hilar lymph nodes. Lungs/Pleura: No pneumothorax.  No pleural effusion. Calcified subcentimeter peripheral left lower lobe granuloma. No acute consolidative airspace disease, lung masses or significant pulmonary nodules. Upper abdomen: Expected pneumobilia in the intrahepatic bile ducts status post CBD stent placement at ERCP 1 day prior. Main pancreatic duct dilation up to 9 mm in the pancreatic body as seen on recent CT abdomen study. Subcentimeter hypodense splenic lesions too small to characterize and unchanged. Musculoskeletal: No aggressive appearing focal osseous lesions. Intact sternotomy wires. Marked thoracic spondylosis. IMPRESSION: 1. No evidence of metastatic disease in the chest. 2. Expected pneumobilia in the intrahepatic bile ducts status post CBD stent placement ERCP 1 day prior. Redemonstration of main pancreatic duct dilation due to known pancreatic head mass. Aortic Atherosclerosis (ICD10-I70.0). Electronically Signed   By: JIlona SorrelM.D.   On: 11/29/2018 10:24   Ct Abdomen Pelvis W Contrast  Result Date: 11/27/2018 CLINICAL DATA:  Abnormal ultrasound, biliary ductal dilatation, assess for pancreatic mass EXAM: CT ABDOMEN AND PELVIS WITH CONTRAST TECHNIQUE: Multidetector CT imaging of the abdomen and pelvis was performed using the standard protocol following bolus administration of intravenous contrast. CONTRAST:  815mOMNIPAQUE IOHEXOL 300 MG/ML SOLN, 3025mMNIPAQUE IOHEXOL 300 MG/ML SOLN, additional oral enteric contrast COMPARISON:  Same day right upper quadrant ultrasound, 11/27/2018, CT abdomen pelvis, 05/04/2014 FINDINGS: Lower chest: No acute abnormality. Calcification within the left breast. Hepatobiliary: No solid liver abnormality is seen. There is gross intrahepatic and extrahepatic biliary ductal dilatation with abrupt cut off of the distal common bile duct in the pancreatic head. Distention of the gallbladder. Pancreas: There is an ill-defined, hypodense mass of the pancreatic head measuring 1.9 x 1.6 x 1.6 cm (series 2, image  26, series 5, image 35). Gross dilatation of the pancreatic duct with abrupt cut off in the pancreatic head at the level of the mass. There may be minimal abutment of the SMV near the portal confluence without definite contact or encasement (series 2, image 23, series 5, image 34). No abutment or encasement of other adjacent vessels. Spleen: Normal in size without significant abnormality. Adrenals/Urinary Tract: Adrenal glands are unremarkable. Kidneys are normal, without renal calculi, solid lesion, or hydronephrosis. Bladder is unremarkable. Stomach/Bowel: Stomach is within normal limits. Incidental diverticulum of the descending portion of the duodenum. Appendix is very diminutive (series 2, image 55)55No evidence of bowel wall thickening, distention, or inflammatory changes. Sigmoid diverticulosis. Vascular/Lymphatic: Aortic atherosclerosis. No enlarged abdominal or pelvic lymph nodes. Reproductive: No mass or other significant abnormality. The left ovary appears to be surgically absent. Other: No abdominal wall hernia or abnormality. No abdominopelvic ascites. Musculoskeletal: No acute or significant osseous findings. IMPRESSION: 1. There is an ill-defined, hypodense mass of the pancreatic head measuring 1.9 x 1.6 x 1.6 cm (series 2, image 26, series 5, image 35) with gross biliary and pancreatic ductal dilatation. Findings are consistent with pancreatic adenocarcinoma. There may be minimal abutment of the SMV near the portal confluence without definite contact or encasement (series 2, image 23, series 5, image 34). No abutment or encasement of other adjacent vessels. Pancreatic protocol MRI may be helpful to better assess for  resectability if desired. 2.  No evidence of abdominal lymphadenopathy or metastatic disease. 3. Other chronic and incidental findings as detailed above. Aortic Atherosclerosis (ICD10-I70.0). Electronically Signed   By: Eddie Candle M.D.   On: 11/27/2018 21:06   Dg Ercp  Result Date:  11/28/2018 CLINICAL DATA:  83 year old female with a history of biliary obstruction EXAM: ERCP TECHNIQUE: Multiple spot images obtained with the fluoroscopic device and submitted for interpretation post-procedure. FLUOROSCOPY TIME:  Fluoroscopy Time: 1 minutes 35 seconds COMPARISON:  None FINDINGS: Limited images during ERCP. Initial image demonstrates endoscope projecting over the upper abdomen. There is then ampullary cannulation with a safety wire and partial opacification of the extrahepatic biliary system. Abrupt cutoff of the contrast column compatible with findings on prior CT. Final image demonstrates partial decompressed system with plastic biliary stent in position. IMPRESSION: Limited images during ERCP demonstrates treatment of bile duct obstruction with placement of a plastic biliary stent. Please refer to the dictated operative report for full details of intraoperative findings and procedure. Electronically Signed   By: Corrie Mckusick D.O.   On: 11/28/2018 14:40   US Abdomen Limited Ruq  Result Date: 11/27/2018 CLINICAL DATA:  History of gallstones, jaundice, upper abdominal pain, nausea. EXAM: ULTRASOUND ABDOMEN LIMITED RIGHT UPPER QUADRANT COMPARISON:  Abdominal ultrasound 12/08/2015 FINDINGS: Gallbladder: The gallbladder is distended. There is a mild amount of sludge. No gallbladder wall thickening. No sonographic Murphy sign noted by sonographer. Common bile duct: Diameter: 1.3 cm Liver: No focal lesion identified. Within normal limits in parenchymal echogenicity. Portal vein is patent on color Doppler imaging with normal direction of blood flow towards the liver. There is intrahepatic biliary duct dilation. Other: There is suggestion of a mass at the head of the pancreas measuring 2.6 x 2.8 by 3.1 cm. The main pancreatic duct is dilated measuring 1.1 cm. IMPRESSION: 1.  Intra and extrahepatic biliary duct dilation. 2. Suggestion of a mass at the head of the pancreas as well as dilation of the  main pancreatic duct. Further evaluation with cross-sectional imaging is recommended. 3. Distended gallbladder with a mild amount of sludge. No significant inflammatory changes. Electronically Signed   By: Audie Pinto M.D.   On: 11/27/2018 16:33    Patient Active Problem List   Diagnosis Date Noted   Pancreatic mass 11/27/2018   Obstructive jaundice due to malignant neoplasm ([place]) 11/27/2018   Itching 11/26/2018   HTN (hypertension) 04/12/2018   Lumbar radiculopathy 04/12/2018   Degeneration of lumbar intervertebral disc 12/16/2017   Spinal stenosis of lumbar region 12/16/2017   Osteoarthritis of hip 09/25/2017   Low back pain 09/25/2017   S/P CABG (coronary artery bypass graft) 12/19/2016   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   Heartburn 05/23/2015   OSA on CPAP 11/23/2014   Generalized anxiety disorder 02/04/2014   Labile hypertension    Fibromyalgia    Lumbar adjacent segment disease with spondylolisthesis 12/24/2013   CAD (coronary artery disease) 12/24/2013   Bronchitis, chronic obstructive w acute bronchitis ([place]) 12/21/2013   Diverticulosis of colon without hemorrhage 11/15/2013   Hot flashes related to aromatase inhibitor therapy 07/27/2013   Osteopenia 07/27/2013   DJD (degenerative joint disease) 09/29/2012   Breast cancer, Left 12/20/2010   FATTY LIVER DISEASE 01/09/2010   Allergic rhinitis 02/18/2007   DIZZINESS, CHRONIC 02/18/2007   Headache in back of head 02/18/2007   Mixed hyperlipidemia 12/23/2006   GLAUCOMA 12/23/2006   Coronary atherosclerosis 12/23/2006   Asthma 12/23/2006   Gastroesophageal reflux disease 12/23/2006   IRRITABLE BOWEL  SYNDROME 12/23/2006   Myalgia and myositis 12/23/2006   Social History   Tobacco Use   Smoking status: Never Smoker   Smokeless tobacco: Never Used  Substance Use Topics   Alcohol use: No    Alcohol/week: 0.0 standard drinks    Current Outpatient Medications:     acetaminophen (TYLENOL) 650 MG CR tablet, Take 650 mg by mouth every 8 (eight) hours as needed for pain., Disp: , Rfl:    albuterol (VENTOLIN HFA) 108 (90 Base) MCG/ACT inhaler, Inhale 2 puffs into the lungs every 6 (six) hours as needed for wheezing or shortness of breath., Disp: , Rfl:    aspirin EC 81 MG tablet, Take 81 mg by mouth daily., Disp: , Rfl:    CALCIUM PO, Take 1 tablet by mouth daily with breakfast. , Disp: , Rfl:    EPINEPHrine 0.3 mg/0.3 mL IJ SOAJ injection, Inject 0.3 mg into the muscle as needed (as directed- FOR A SEVERE REACTION). , Disp: , Rfl:    fexofenadine (ALLEGRA) 180 MG tablet, Take 180 mg by mouth daily as needed (for seasonal allergies). , Disp: , Rfl:    fluticasone (FLONASE) 50 MCG/ACT nasal spray, Place 1-2 sprays into both nostrils daily as needed for allergies or rhinitis., Disp: , Rfl:    Glucosamine-Chondroit-Vit C-Mn (GLUCOSAMINE CHONDR 1500 COMPLX PO), Take 1 tablet by mouth 3 (three) times daily.  , Disp: , Rfl:    hydrALAZINE (APRESOLINE) 50 MG tablet, Take 0.5 tablets (25 mg total) by mouth 3 (three) times daily. (Patient taking differently: Take 25 mg by mouth 2 (two) times daily. ), Disp: 135 tablet, Rfl: 1   latanoprost (XALATAN) 0.005 % ophthalmic solution, Place 1 drop into both eyes at bedtime. , Disp: , Rfl:    losartan (COZAAR) 100 MG tablet, TAKE 1/2 TABLET BY MOUTH THREE TIMES A DAY (Patient taking differently: Take 50 mg by mouth 2 (two) times daily. ), Disp: 135 tablet, Rfl: 0   metoprolol tartrate (LOPRESSOR) 50 MG tablet, TAKE 1 TABLET BY MOUTH THREE TIMES A DAY (Patient taking differently: Take 50 mg by mouth 3 (three) times daily. ), Disp: 270 tablet, Rfl: 3   nitroGLYCERIN (NITROSTAT) 0.4 MG SL tablet, Place 1 tablet (0.4 mg total) under the tongue every 5 (five) minutes as needed for chest pain., Disp: 25 tablet, Rfl: 2   pantoprazole (PROTONIX) 40 MG tablet, Take 1 tablet (40 mg total) by mouth 2 (two) times daily. Take 30-60  minutes before breakfast, Disp: 60 tablet, Rfl: 11   Psyllium (METAMUCIL FIBER PO), Take by mouth See admin instructions. Mix one rounded teaspoonful into 8 oz water and drink once a day, Disp: , Rfl:    rosuvastatin (CRESTOR) 5 MG tablet, TAKE 1 TABLET BY MOUTH 3 TIMES A WEEK. (TAKE ON MONDAY, WEDNESDAY, AND FRIDAY) (Patient taking differently: Take 5 mg by mouth every Monday, Wednesday, and Friday. ), Disp: 30 tablet, Rfl: 5   sodium chloride (OCEAN) 0.65 % SOLN nasal spray, Place 1 spray into both nostrils as needed for congestion., Disp: , Rfl:    sucralfate (CARAFATE) 1 g tablet, TAKE 1 TABLET BY MOUTH BY MOUTH TWICE DAILY(ONE BEFORE DINNER) (Patient taking differently: Take 1 g by mouth 2 (two) times daily before a meal. ), Disp: 60 tablet, Rfl: 1   vitamin B-12 (CYANOCOBALAMIN) 1000 MCG tablet, Take 1,000 mcg by mouth daily.  , Disp: , Rfl:    VITAMIN D PO, Take 2 tablets by mouth daily with breakfast. , Disp: ,  Rfl:    AMBULATORY NON FORMULARY MEDICATION, See admin instructions. FDgard for Functional Dyspepsia capsules: Take 1 capsule by mouth two to three times a day, Disp: , Rfl:    traMADol (ULTRAM) 50 MG tablet, Take 1/2 to 1 tablet by mouth three times daily as needed (Patient not taking: Reported on 11/28/2018), Disp: 90 tablet, Rfl: 5 Allergies  Allergen Reactions   Levaquin [Levofloxacin In D5w]     Elevated BP   Choline Fenofibrate Other (See Comments)     pt states INTOL to Trilipix w/ "thigh burning"   Hctz [Hydrochlorothiazide]     Causes hyponatremia   Simvastatin Other (See Comments)     pt states INTOL to STATINS' \\T'$ \ refuses to restart   Adhesive [Tape] Rash   Bentyl [Dicyclomine Hcl] Other (See Comments)    "Made me feel weird and drained me"   Ceclor [Cefaclor] Rash   Clarithromycin Rash   Codeine Nausea Only   Doxycycline Rash   Hydrocodone     Nightmare after taking cough syrup w/hydrocodone   Lisinopril Cough    Developed ACE cough...    Penicillins Itching, Rash and Other (See Comments)    At injection site Did it involve swelling of the face/tongue/throat, SOB, or low BP? No Did it involve sudden or severe rash/hives, skin peeling, or any reaction on the inside of your mouth or nose? No Did you need to seek medical attention at a hospital or doctor's office? No When did it last happen?"It was a long time ago" If all above answers are "NO", may proceed with cephalosporin use.    Tobramycin-Dexamethasone Rash    Assessment & Plan:   No diagnosis found.  No orders of the defined types were placed in this encounter.  No orders of the defined types were placed in this encounter.   Arnette Norris, MD 12/03/2018  Time spent with the patient: 60 minutes, spent in obtaining information about her symptoms, reviewing her previous labs, evaluations, and treatments, counseling her about her condition (please see the discussed topics above), and developing a plan to further investigate it; she had a number of questions which I addressed.   [id number] physician/qualified health professional telephone evaluation 5 to 10 minutes [id number] physician/qualified help functional [place] evaluation for 11 to 20 minutes [id number] physician/qualify he will professional telephone evaluation for 21 to 30 minutes

## 2018-12-03 NOTE — Assessment & Plan Note (Signed)
She is coming in for labs on 12/07/18 in my office- currently she is avoiding starches and sugars and her blood sugar is already down in the low 200s.  Will await further lab work. The patient and her daughter indicate understanding of these issues and agrees with the plan.  Orders Placed This Encounter  Procedures  . CBC with Differential/Platelet  . Comprehensive metabolic panel  . Ferritin  . Hemoglobin A1c  . T3  . T4  . TSH

## 2018-12-05 DIAGNOSIS — Z23 Encounter for immunization: Secondary | ICD-10-CM | POA: Diagnosis not present

## 2018-12-07 ENCOUNTER — Other Ambulatory Visit: Payer: Self-pay

## 2018-12-07 ENCOUNTER — Telehealth: Payer: Self-pay | Admitting: Gastroenterology

## 2018-12-07 ENCOUNTER — Other Ambulatory Visit: Payer: Medicare Other

## 2018-12-07 ENCOUNTER — Other Ambulatory Visit (HOSPITAL_COMMUNITY)
Admission: RE | Admit: 2018-12-07 | Discharge: 2018-12-07 | Disposition: A | Discharge status: Home / Self Care | Payer: Medicare Other | Source: Ambulatory Visit | Attending: Gastroenterology | Admitting: Gastroenterology

## 2018-12-07 ENCOUNTER — Other Ambulatory Visit (INDEPENDENT_AMBULATORY_CARE_PROVIDER_SITE_OTHER): Payer: Medicare Other

## 2018-12-07 DIAGNOSIS — Z01812 Encounter for preprocedural laboratory examination: Secondary | ICD-10-CM | POA: Insufficient documentation

## 2018-12-07 DIAGNOSIS — E139 Other specified diabetes mellitus without complications: Secondary | ICD-10-CM

## 2018-12-07 DIAGNOSIS — Z20828 Contact with and (suspected) exposure to other viral communicable diseases: Secondary | ICD-10-CM | POA: Diagnosis not present

## 2018-12-07 LAB — CBC WITH DIFFERENTIAL/PLATELET
Basophils Absolute: 0.1 10*3/uL (ref 0.0–0.1)
Basophils Relative: 0.7 % (ref 0.0–3.0)
Eosinophils Absolute: 0.1 10*3/uL (ref 0.0–0.7)
Eosinophils Relative: 1.6 % (ref 0.0–5.0)
HCT: 33.1 % — ABNORMAL LOW (ref 36.0–46.0)
Hemoglobin: 11 g/dL — ABNORMAL LOW (ref 12.0–15.0)
Lymphocytes Relative: 24.9 % (ref 12.0–46.0)
Lymphs Abs: 2.2 10*3/uL (ref 0.7–4.0)
MCHC: 33.1 g/dL (ref 30.0–36.0)
MCV: 93.4 fl (ref 78.0–100.0)
Monocytes Absolute: 0.6 10*3/uL (ref 0.1–1.0)
Monocytes Relative: 6.9 % (ref 3.0–12.0)
Neutro Abs: 5.9 10*3/uL (ref 1.4–7.7)
Neutrophils Relative %: 65.9 % (ref 43.0–77.0)
Platelets: 495 10*3/uL — ABNORMAL HIGH (ref 150.0–400.0)
RBC: 3.55 Mil/uL — ABNORMAL LOW (ref 3.87–5.11)
RDW: 14.7 % (ref 11.5–15.5)
WBC: 8.9 10*3/uL (ref 4.0–10.5)

## 2018-12-07 LAB — COMPREHENSIVE METABOLIC PANEL
ALT: 297 U/L — ABNORMAL HIGH (ref 0–35)
AST: 309 U/L — ABNORMAL HIGH (ref 0–37)
Albumin: 4 g/dL (ref 3.5–5.2)
Alkaline Phosphatase: 334 U/L — ABNORMAL HIGH (ref 39–117)
BUN: 13 mg/dL (ref 6–23)
CO2: 25 mEq/L (ref 19–32)
Calcium: 9.5 mg/dL (ref 8.4–10.5)
Chloride: 97 mEq/L (ref 96–112)
Creatinine, Ser: 0.58 mg/dL (ref 0.40–1.20)
GFR: 99.71 mL/min (ref >60.00)
Glucose, Bld: 154 mg/dL — ABNORMAL HIGH (ref 70–99)
Potassium: 4.3 mEq/L (ref 3.5–5.1)
Sodium: 131 mEq/L — ABNORMAL LOW (ref 135–145)
Total Bilirubin: 2.8 mg/dL — ABNORMAL HIGH (ref 0.2–1.2)
Total Protein: 6.7 g/dL (ref 6.0–8.3)

## 2018-12-07 LAB — HEMOGLOBIN A1C: Hgb A1c MFr Bld: 7.9 % — ABNORMAL HIGH (ref 4.6–6.5)

## 2018-12-07 LAB — SARS CORONAVIRUS 2 (TAT 6-24 HRS): SARS Coronavirus 2: NEGATIVE

## 2018-12-07 LAB — TSH: TSH: 2.1 u[IU]/mL (ref 0.35–4.50)

## 2018-12-07 LAB — FERRITIN: Ferritin: 1054.2 ng/mL — ABNORMAL HIGH (ref 10.0–291.0)

## 2018-12-07 MED ORDER — TRAMADOL HCL 50 MG PO TABS: ORAL_TABLET | ORAL | 1 refills | Status: DC

## 2018-12-07 NOTE — Telephone Encounter (Signed)
Pt states that she has been having strong stomach pain on and off today. She would like some advise.

## 2018-12-08 ENCOUNTER — Telehealth: Payer: Self-pay | Admitting: Gastroenterology

## 2018-12-08 ENCOUNTER — Inpatient Hospital Stay: Payer: Medicare Other | Admitting: Family Medicine

## 2018-12-08 LAB — T3: T3, Total: 87 ng/dL (ref 76–181)

## 2018-12-08 LAB — T4: T4, Total: 8.8 ug/dL (ref 5.1–11.9)

## 2018-12-08 MED ORDER — TRAMADOL HCL 50 MG PO TABS: ORAL_TABLET | ORAL | 1 refills | Status: DC

## 2018-12-08 NOTE — Telephone Encounter (Signed)
Prescription re-faxed to pharmacy.

## 2018-12-08 NOTE — Telephone Encounter (Signed)
Late entry.  Patient reported abdominal pain yesterday 12/07/18 4:45 that wsa not controlled by Tylenol.  Discussed with Dr. Havery Moros and okay to send in refill of Tramadol untll her EUS on 12/10/18.  Prescription was faxed again this am.          See other phone notes for details.

## 2018-12-08 NOTE — Telephone Encounter (Signed)
Please send in tramadol.

## 2018-12-08 NOTE — Telephone Encounter (Signed)
Pt states that her pharmacy has not received prescription for tramadol yet, she called this morning. Pls send it again.

## 2018-12-08 NOTE — Telephone Encounter (Signed)
Patient called through answering service last evening.  It appears she called the office yesterday with abdominal pain, has EUS scheduled this week for pancreatic mass. Dr. Fuller Plan was off yesterday, prescription for tramadol sent, but patient reports pharmacy did not have the prescription.(? MFA issue for this med).  I did not send in any new prescription last evening.  Please address this morning to make sure this is the medicine and dosing you would like for her.

## 2018-12-09 ENCOUNTER — Ambulatory Visit: Payer: Medicare Other | Admitting: Physician Assistant

## 2018-12-09 NOTE — Progress Notes (Signed)
Pre call done.pt has been able to quarantine and has not had flu like symptoms or fever. Questions answered.

## 2018-12-10 ENCOUNTER — Ambulatory Visit (HOSPITAL_COMMUNITY): Payer: Medicare Other | Admitting: Anesthesiology

## 2018-12-10 ENCOUNTER — Encounter (HOSPITAL_COMMUNITY)
Admission: RE | Disposition: A | Discharge status: Home / Self Care | Payer: Self-pay | Source: Home / Self Care | Attending: Gastroenterology

## 2018-12-10 ENCOUNTER — Other Ambulatory Visit: Payer: Self-pay

## 2018-12-10 ENCOUNTER — Encounter (HOSPITAL_COMMUNITY): Payer: Self-pay | Admitting: *Deleted

## 2018-12-10 ENCOUNTER — Telehealth: Payer: Self-pay | Admitting: Gastroenterology

## 2018-12-10 ENCOUNTER — Ambulatory Visit (HOSPITAL_COMMUNITY)
Admission: RE | Admit: 2018-12-10 | Discharge: 2018-12-10 | Disposition: A | Discharge status: Home / Self Care | Payer: Medicare Other | Attending: Gastroenterology | Admitting: Gastroenterology

## 2018-12-10 DIAGNOSIS — I2510 Atherosclerotic heart disease of native coronary artery without angina pectoris: Secondary | ICD-10-CM | POA: Diagnosis not present

## 2018-12-10 DIAGNOSIS — K8689 Other specified diseases of pancreas: Secondary | ICD-10-CM

## 2018-12-10 DIAGNOSIS — K831 Obstruction of bile duct: Secondary | ICD-10-CM

## 2018-12-10 DIAGNOSIS — J449 Chronic obstructive pulmonary disease, unspecified: Secondary | ICD-10-CM | POA: Diagnosis not present

## 2018-12-10 DIAGNOSIS — E119 Type 2 diabetes mellitus without complications: Secondary | ICD-10-CM | POA: Insufficient documentation

## 2018-12-10 DIAGNOSIS — K869 Disease of pancreas, unspecified: Secondary | ICD-10-CM | POA: Diagnosis present

## 2018-12-10 DIAGNOSIS — I25119 Atherosclerotic heart disease of native coronary artery with unspecified angina pectoris: Secondary | ICD-10-CM | POA: Diagnosis not present

## 2018-12-10 DIAGNOSIS — R591 Generalized enlarged lymph nodes: Secondary | ICD-10-CM | POA: Diagnosis not present

## 2018-12-10 DIAGNOSIS — G4730 Sleep apnea, unspecified: Secondary | ICD-10-CM | POA: Insufficient documentation

## 2018-12-10 DIAGNOSIS — C250 Malignant neoplasm of head of pancreas: Secondary | ICD-10-CM | POA: Diagnosis not present

## 2018-12-10 DIAGNOSIS — I10 Essential (primary) hypertension: Secondary | ICD-10-CM | POA: Diagnosis not present

## 2018-12-10 HISTORY — PX: ESOPHAGOGASTRODUODENOSCOPY (EGD) WITH PROPOFOL: SHX5813

## 2018-12-10 HISTORY — PX: EUS: SHX5427

## 2018-12-10 HISTORY — PX: FINE NEEDLE ASPIRATION: SHX5430

## 2018-12-10 SURGERY — UPPER ENDOSCOPIC ULTRASOUND (EUS) RADIAL
Anesthesia: Monitor Anesthesia Care

## 2018-12-10 MED ORDER — LIDOCAINE HCL (CARDIAC) PF 100 MG/5ML IV SOSY
PREFILLED_SYRINGE | INTRAVENOUS | Status: DC | PRN
  Administered 2018-12-10: 50 mg via INTRAVENOUS

## 2018-12-10 MED ORDER — PROPOFOL 10 MG/ML IV BOLUS
INTRAVENOUS | Status: AC
  Filled 2018-12-10: qty 40

## 2018-12-10 MED ORDER — SODIUM CHLORIDE 0.9 % IV SOLN
INTRAVENOUS | Status: DC
  Administered 2018-12-10: 09:00:00 via INTRAVENOUS

## 2018-12-10 MED ORDER — GLYCOPYRROLATE 0.2 MG/ML IJ SOLN
INTRAMUSCULAR | Status: DC | PRN
  Administered 2018-12-10: 0.2 mg via INTRAVENOUS

## 2018-12-10 MED ORDER — PROPOFOL 10 MG/ML IV BOLUS
INTRAVENOUS | Status: AC
  Filled 2018-12-10: qty 20

## 2018-12-10 MED ORDER — PROPOFOL 500 MG/50ML IV EMUL
INTRAVENOUS | Status: DC | PRN
  Administered 2018-12-10: 200 ug/kg/min via INTRAVENOUS

## 2018-12-10 MED ORDER — LACTATED RINGERS IV SOLN
INTRAVENOUS | Status: DC
  Administered 2018-12-10: 1000 mL via INTRAVENOUS

## 2018-12-10 NOTE — Anesthesia Preprocedure Evaluation (Addendum)
Anesthesia Evaluation  Patient identified by MRN, date of birth, ID band Patient awake    Reviewed: Allergy & Precautions, NPO status , Patient's Chart, lab work & pertinent test results  History of Anesthesia Complications Negative for: history of anesthetic complications  Airway Mallampati: III  TM Distance: <3 FB Neck ROM: Full    Dental  (+) Dental Advisory Given   Pulmonary asthma , sleep apnea , COPD,    breath sounds clear to auscultation       Cardiovascular hypertension, + CAD   Rhythm:Regular     Neuro/Psych  Headaches, PSYCHIATRIC DISORDERS Anxiety  Neuromuscular disease    GI/Hepatic hiatal hernia, GERD  ,  Endo/Other  diabetes  Renal/GU      Musculoskeletal  (+) Arthritis , Fibromyalgia -  Abdominal   Peds  Hematology   Anesthesia Other Findings  - Left ventricle: The cavity size was normal. Wall thickness was  normal. Systolic function was normal. The estimated ejection  fraction was in the range of 60% to 65%. Wall motion was normal;  there were no regional wall motion abnormalities. Left  ventricular diastolic function parameters were normal.  - Aortic valve: There was trivial regurgitation.  - Atrial septum: No defect or patent foramen ovale was identified.   Reproductive/Obstetrics                            Anesthesia Physical Anesthesia Plan  ASA: III  Anesthesia Plan: MAC   Post-op Pain Management:    Induction: Intravenous  PONV Risk Score and Plan: 2 and Propofol infusion and Treatment may vary due to age or medical condition  Airway Management Planned: Nasal Cannula  Additional Equipment: None  Intra-op Plan:   Post-operative Plan:   Informed Consent: I have reviewed the patients History and Physical, chart, labs and discussed the procedure including the risks, benefits and alternatives for the proposed anesthesia with the patient or  authorized representative who has indicated his/her understanding and acceptance.     Dental advisory given  Plan Discussed with: CRNA and Surgeon  Anesthesia Plan Comments:         Anesthesia Quick Evaluation

## 2018-12-10 NOTE — Telephone Encounter (Signed)
I spoke with the patient and let her know that she will get a call from Oncology and they will set up an appt with her directly

## 2018-12-10 NOTE — Telephone Encounter (Signed)
Pt's daughter [name] called to request a referral to a different oncologist, pt wants to see Dr. Donneta Romberg.

## 2018-12-10 NOTE — Telephone Encounter (Signed)
Pt's daughter returned your call and I gave her your msg.

## 2018-12-10 NOTE — Transfer of Care (Signed)
Immediate Anesthesia Transfer of Care Note  Patient: LATICE WAITMAN  Procedure(s) Performed: ESOPHAGEAL ENDOSCOPIC ULTRASOUND (EUS) RADIAL (N/A ) UPPER ENDOSCOPIC ULTRASOUND (EUS) RADIAL (N/A ) FINE NEEDLE ASPIRATION (FNA) LINEAR (N/A )  Patient Location: PACU  Anesthesia Type:MAC  Level of Consciousness: awake, alert , oriented and patient cooperative  Airway & Oxygen Therapy: Patient Spontanous Breathing and Patient connected to face mask oxygen  Post-op Assessment: Report given to RN, Post -op Vital signs reviewed and stable and Patient moving all extremities X 4  Post vital signs: stable  Last Vitals:  Vitals Value Taken Time  BP 104/36 12/10/18 0905  Temp 36.4 C 12/10/18 0905  Pulse 47 12/10/18 0905  Resp 12 12/10/18 0905  SpO2 100 % 12/10/18 0905    Last Pain:  Vitals:   12/10/18 0905  TempSrc: Temporal  PainSc: 0-No pain         Complications: No apparent anesthesia complications

## 2018-12-10 NOTE — Telephone Encounter (Signed)
Left message for patient to call back  Referral placed

## 2018-12-10 NOTE — Interval H&P Note (Signed)
History and Physical Interval Note:  12/10/2018 7:07 AM  [name]  has presented today for surgery, with the diagnosis of pancreatic mass- malignant.  The various methods of treatment have been discussed with the patient and family. After consideration of risks, benefits and other options for treatment, the patient has consented to  Procedure(s): ESOPHAGEAL ENDOSCOPIC ULTRASOUND (EUS) RADIAL (N/A) UPPER ENDOSCOPIC ULTRASOUND (EUS) RADIAL (N/A) as a surgical intervention.  The patient's history has been reviewed, patient examined, no change in status, stable for surgery.  I have reviewed the patient's chart and labs.  Questions were answered to the patient's satisfaction.     [name]

## 2018-12-10 NOTE — Op Note (Signed)
[facility] Patient Name: [name] Procedure Date: 12/10/2018 MRN: [id number] Attending MD: [name] , MD Date of Birth: 06-Jan-1936 CSN: [id number] Age: 83 Admit Type: Outpatient Procedure:                Upper EUS Indications:              recent painless jaundice, CT scan shows mass in                            head of pancreas, s/p ERCP Dr. Carlean Purl 10 days ago                            showed distal CBD stricture, biliary brushing                            +atypical cells, stented with plastic biliary stent Providers:                [name], MD, Josie Dixon, RN, Baird Cancer, RN, Elspeth Cho Tech., Technician, Enrigue Catena, CRNA Referring MD:             Silvano Rusk, MD; Lucio Edward, MD Medicines:                Monitored Anesthesia Care Complications:            No immediate complications. Estimated blood loss:                            None. Estimated Blood Loss:     Estimated blood loss: none. Procedure:                Pre-Anesthesia Assessment:                           - Prior to the procedure, a History and Physical                            was performed, and patient medications and                            allergies were reviewed. The patient's tolerance of                            previous anesthesia was also reviewed. The risks                            and benefits of the procedure and the sedation                            options and risks were discussed with the patient.  All questions were answered, and informed consent                            was obtained. Prior Anticoagulants: The patient has                            taken no previous anticoagulant or antiplatelet                            agents. ASA Grade Assessment: III - A patient with                            severe systemic disease. After reviewing the risks                          and benefits, the patient was deemed in                            satisfactory condition to undergo the procedure.                           After obtaining informed consent, the endoscope was                            passed under direct vision. Throughout the                            procedure, the patient's blood pressure, pulse, and                            oxygen saturations were monitored continuously. The                            [id number] Olympus Radial EUS was                            introduced through the mouth, and advanced to the                            bulb of the duodenum. The upper EUS was                            accomplished without difficulty. The patient                            tolerated the procedure well. Scope In: Scope Out: Findings:      ENDOSCOPIC FINDING: :      The examined esophagus was endoscopically normal.      The entire examined stomach was endoscopically normal.      The examined duodenum was endoscopically normal.      ENDOSONOGRAPHIC FINDING: :      1. Hypoechoic, heterogeneous, indistinctly bordered solid mass in the       head of pancreas measuring 1.9cm by 2.5cm. The mass directly abuts the  SMV for at least 8-38mm, concerning for invasion. The mass causes main       pancreatic duct obstruction, dilation (53mm in the body). The previously       placed plastic biliary stent is in good position within a non-dilated       CBD. There was a single small but round and suspicious periportal       lymphnode, measuring 6.61mm across. I sampled with periportal lymphnode       with a single transduodenal pass with a 25 gauge EUS FNA needle.       Preliminary cytology reading showed no atypicall cells and so I turned       attention to the head of pancreas mass. I sampled the head of pancreas       mass with 3 duodenal bulb passes with a 25 gauge EUS FNA needle. Color       Doppler confirmed a small to  medium sized arterial flow vessel between       the scope tip and target lesion however I was able to find a small angle       that provided what appeared to be a safe approach.      2. Limited views of the liver, spleen were normal. Impression:               - 1.9cm by 2.5cm solid mass in the head of pancreas                            with associated, suspicious appearing 6.41mm                            periportal lymphnode. The mass is causing main                            pancreatic duct obstruction, dilation. Previously                            placed plastic biliary stent is in position in the                            CBD, I was never able to view the major papilla                            however to confirm the stent extends into the                            duodenum. The mass directly abuts the SMV,                            concerning for invasion. Rreliminary cytology                            review of the FNA of the periportal lymphnode was                            non-diagnotic. Preliminary cytology review of FNA  of the head of pancreas mass shows malignant cells                            (adenocarcinoma). Moderate Sedation:      Not Applicable - Patient had care per Anesthesia. Recommendation:           - Discharge patient to home.                           - Await final cytology results. Procedure Code(s):        --- Professional ---                           [phone], Esophagogastroduodenoscopy, flexible,                            transoral; with transendoscopic ultrasound-guided                            intramural or transmural fine needle                            aspiration/biopsy(s), (includes endoscopic                            ultrasound examination limited to the esophagus,                            stomach or duodenum, and adjacent structures) Diagnosis Code(s):        --- Professional ---                            K86.89, Other specified diseases of pancreas                           R59.1, Generalized enlarged lymph nodes CPT copyright 2019 [employer]. All rights reserved. The codes documented in this report are preliminary and upon coder review may  be revised to meet current compliance requirements. [name], MD 12/10/2018 9:11:29 AM This report has been signed electronically. Number of Addenda: 0

## 2018-12-10 NOTE — Discharge Instructions (Signed)
YOU HAD AN ENDOSCOPIC PROCEDURE TODAY: Refer to the procedure report and other information in the discharge instructions given to you for any specific questions about what was found during the examination. If this information does not answer your questions, please call [place] office at [phone] to clarify.   YOU SHOULD EXPECT: Some feelings of bloating in the abdomen. Passage of more gas than usual. Walking can help get rid of the air that was put into your GI tract during the procedure and reduce the bloating. If you had a lower endoscopy (such as a colonoscopy or flexible sigmoidoscopy) you may notice spotting of blood in your stool or on the toilet paper. Some abdominal soreness may be present for a day or two, also.  DIET: Your first meal following the procedure should be a light meal and then it is ok to progress to your normal diet. A half-sandwich or bowl of soup is an example of a good first meal. Heavy or fried foods are harder to digest and may make you feel nauseous or bloated. Drink plenty of fluids but you should avoid alcoholic beverages for 24 hours. If you had a esophageal dilation, please see attached instructions for diet.    ACTIVITY: Your care partner should take you home directly after the procedure. You should plan to take it easy, moving slowly for the rest of the day. You can resume normal activity the day after the procedure however YOU SHOULD NOT DRIVE, use power tools, machinery or perform tasks that involve climbing or major physical exertion for 24 hours (because of the sedation medicines used during the test).   SYMPTOMS TO REPORT IMMEDIATELY: A gastroenterologist can be reached at any hour. Please call [phone]  for any of the following symptoms:   Following upper endoscopy (EGD, EUS, ERCP, esophageal dilation) Vomiting of blood or coffee ground material  New, significant abdominal pain  New, significant chest pain or pain under the shoulder blades  Painful or  persistently difficult swallowing  New shortness of breath  Black, tarry-looking or red, bloody stools  FOLLOW UP:  If any biopsies were taken you will be contacted by phone or by letter within the next 1-3 weeks. Call [phone]  if you have not heard about the biopsies in 3 weeks.  Please also call with any specific questions about appointments or follow up tests.  

## 2018-12-10 NOTE — Anesthesia Procedure Notes (Signed)
Procedure Name: MAC Date/Time: 12/10/2018 7:32 AM Performed by: Lissa Morales, CRNA Pre-anesthesia Checklist: Patient identified, Emergency Drugs available, Suction available, Patient being monitored and Timeout performed Patient Re-evaluated:Patient Re-evaluated prior to induction Oxygen Delivery Method: Simple face mask Preoxygenation: Pre-oxygenation with 100% oxygen Placement Confirmation: positive ETCO2

## 2018-12-11 ENCOUNTER — Telehealth: Payer: Self-pay

## 2018-12-11 LAB — CYTOLOGY - NON PAP

## 2018-12-11 NOTE — Telephone Encounter (Signed)
  Oncology Nurse Navigator Documentation   Spoke with [name] to introduce myself as GI Navigator and to provide my direct contact information. Reviewed upcoming appointment and  explained vistor restrictions. She would like her daughter, [name], to be involved in appointment by phone. Will plan to met patient at appointment on 10/23. She understands that she can call with questions or concerns.

## 2018-12-11 NOTE — Telephone Encounter (Signed)
-----   Message from [name], MD sent at 12/11/2018  5:19 AM EDT ----- I meant Dr. Barry Dienes  Thanks  ----- Message ----- From: Marlon Pel, RN Sent: 12/10/2018   5:23 PM EDT To: [name], MD, Ladene Artist, MD, #  I made referral to medical oncology.  Did you mean [facility] Dr. Barry Dienes or is there surgeon at Oncology? ----- Message ----- From: Ladene Artist, MD Sent: 12/10/2018   9:23 AM EDT To: Marlon Pel, RN, [name], MD, #  Thank you Linna Hoff. Sheri please make referrals as below. MS ----- Message ----- From: [name], MD Sent: 12/10/2018   9:14 AM EDT To: Ladene Artist, MD, Gatha Mayer, MD  Jolene Provost,  Just completed EUS.  Tricky angles with an arterial vessel between the scope tip and mass.  See full report in Epic.  - 1.9cm by 2.5cm solid mass in the head of pancreas with associated, suspicious appearing 6.61mm periportal lymphnode. The mass is causing main pancreatic duct obstruction, dilation. Previously placed plastic biliary stent is in position in the CBD, I was never able to view the major papilla however to confirm the stent extends into the duodenum. The mass directly abuts the SMV, concerning for invasion.  Rreliminary cytology review of the FNA of the periportal lymphnode was non-diagnotic.  Preliminary cytology review of FNA of the head of pancreas mass shows malignant cells (adenocarcinoma).  She will need referrals to medical and surgical oncology; they will probably offer neoadjuvant chemo/XRT before reconsidering for surgery.   Thanks, I'll let you know final path results.   Radonna Ricker

## 2018-12-11 NOTE — Telephone Encounter (Signed)
Referrals faxed to Dr. Barry Dienes

## 2018-12-15 ENCOUNTER — Ambulatory Visit: Payer: Medicare Other | Admitting: Physical Therapy

## 2018-12-15 NOTE — Progress Notes (Signed)
Subjective:   Patient ID: [name], female    DOB: February 14, 1936, 83 y.o.   MRN: [id number]  [name] is a pleasant 83 y.o. year old female who presents to clinic today with Hospitalization Follow-up (Oct 2- 4th)  on 12/16/2018  HPI:  Unfortunately she was readmitted to the hospital on 12/10/18 for an EUS by Dr. Ardis Hughs.  EUS showed:  1.9cm by 2.5cm solid mass in the head of pancreas with associated, suspicious appearing 6.62mm periportal lymphnode. The mass is causing main pancreatic duct obstruction, dilation. Previously placed plastic biliary stent is in position in the CBD, I was never able to view the major papilla however to confirm the stent extends into the duodenum. The mass directly abuts the SMV, concerning for invasion.  Rreliminary cytology review of the FNA of the periportal lymphnode was non-diagnotic.  Preliminary cytology review of FNA of the head of pancreas mass shows malignant cells (adenocarcinoma).  Pathology showed:  Clinical History: 1.9 x 2.5 cm pancreatic mass  Specimen Submitted: A. PANCREAS, HEAD, FINE NEEDLE ASPIRATION:    DIAGNOSIS:  - Malignant cells consistent with adenocarcinoma   She was referred to see Dr. Barry Dienes- has appointment on 10/28.  Has appt with oncology on 10/23  Dg Chest 2 View  Result Date: 11/27/2018 CLINICAL DATA:  Chronic cough. EXAM: CHEST - 2 VIEW COMPARISON:  Radiographs of June 19, 2017. FINDINGS: The heart size and mediastinal contours are within normal limits. Both lungs are clear. Status post coronary bypass graft. No pneumothorax or pleural effusion is noted. The visualized skeletal structures are unremarkable. IMPRESSION: No active cardiopulmonary disease. Electronically Signed   By: Marijo Conception M.D.   On: 11/27/2018 16:34   Ct Chest W Contrast  Result Date: 11/29/2018 CLINICAL DATA:  Inpatient. Pancreatic mass concerning for malignancy on recent CT abdomen study. Staging evaluation of the chest  requested. EXAM: CT CHEST WITH CONTRAST TECHNIQUE: Multidetector CT imaging of the chest was performed during intravenous contrast administration. CONTRAST:  48mL OMNIPAQUE IOHEXOL 300 MG/ML  SOLN COMPARISON:  11/27/2018 CT abdomen/pelvis and chest radiograph. FINDINGS: Cardiovascular: Normal heart size. No significant pericardial effusion/thickening. Left anterior descending coronary atherosclerosis status post CABG. Atherosclerotic nonaneurysmal thoracic aorta. Normal caliber pulmonary arteries. No central pulmonary emboli. Mediastinum/Nodes: No discrete thyroid nodules. Unremarkable esophagus. No pathologically enlarged axillary, mediastinal or hilar lymph nodes. Lungs/Pleura: No pneumothorax. No pleural effusion. Calcified subcentimeter peripheral left lower lobe granuloma. No acute consolidative airspace disease, lung masses or significant pulmonary nodules. Upper abdomen: Expected pneumobilia in the intrahepatic bile ducts status post CBD stent placement at ERCP 1 day prior. Main pancreatic duct dilation up to 9 mm in the pancreatic body as seen on recent CT abdomen study. Subcentimeter hypodense splenic lesions too small to characterize and unchanged. Musculoskeletal: No aggressive appearing focal osseous lesions. Intact sternotomy wires. Marked thoracic spondylosis. IMPRESSION: 1. No evidence of metastatic disease in the chest. 2. Expected pneumobilia in the intrahepatic bile ducts status post CBD stent placement ERCP 1 day prior. Redemonstration of main pancreatic duct dilation due to known pancreatic head mass. Aortic Atherosclerosis (ICD10-I70.0). Electronically Signed   By: Ilona Sorrel M.D.   On: 11/29/2018 10:24   Ct Abdomen Pelvis W Contrast  Result Date: 11/27/2018 CLINICAL DATA:  Abnormal ultrasound, biliary ductal dilatation, assess for pancreatic mass EXAM: CT ABDOMEN AND PELVIS WITH CONTRAST TECHNIQUE: Multidetector CT imaging of the abdomen and pelvis was performed using the standard  protocol following bolus administration of intravenous contrast. CONTRAST:  28mL  OMNIPAQUE IOHEXOL 300 MG/ML SOLN, 25mL OMNIPAQUE IOHEXOL 300 MG/ML SOLN, additional oral enteric contrast COMPARISON:  Same day right upper quadrant ultrasound, 11/27/2018, CT abdomen pelvis, 05/04/2014 FINDINGS: Lower chest: No acute abnormality. Calcification within the left breast. Hepatobiliary: No solid liver abnormality is seen. There is gross intrahepatic and extrahepatic biliary ductal dilatation with abrupt cut off of the distal common bile duct in the pancreatic head. Distention of the gallbladder. Pancreas: There is an ill-defined, hypodense mass of the pancreatic head measuring 1.9 x 1.6 x 1.6 cm (series 2, image 26, series 5, image 35). Gross dilatation of the pancreatic duct with abrupt cut off in the pancreatic head at the level of the mass. There may be minimal abutment of the SMV near the portal confluence without definite contact or encasement (series 2, image 23, series 5, image 34). No abutment or encasement of other adjacent vessels. Spleen: Normal in size without significant abnormality. Adrenals/Urinary Tract: Adrenal glands are unremarkable. Kidneys are normal, without renal calculi, solid lesion, or hydronephrosis. Bladder is unremarkable. Stomach/Bowel: Stomach is within normal limits. Incidental diverticulum of the descending portion of the duodenum. Appendix is very diminutive (series 2, image 82). No evidence of bowel wall thickening, distention, or inflammatory changes. Sigmoid diverticulosis. Vascular/Lymphatic: Aortic atherosclerosis. No enlarged abdominal or pelvic lymph nodes. Reproductive: No mass or other significant abnormality. The left ovary appears to be surgically absent. Other: No abdominal wall hernia or abnormality. No abdominopelvic ascites. Musculoskeletal: No acute or significant osseous findings. IMPRESSION: 1. There is an ill-defined, hypodense mass of the pancreatic head measuring 1.9 x  1.6 x 1.6 cm (series 2, image 26, series 5, image 35) with gross biliary and pancreatic ductal dilatation. Findings are consistent with pancreatic adenocarcinoma. There may be minimal abutment of the SMV near the portal confluence without definite contact or encasement (series 2, image 23, series 5, image 34). No abutment or encasement of other adjacent vessels. Pancreatic protocol MRI may be helpful to better assess for resectability if desired. 2.  No evidence of abdominal lymphadenopathy or metastatic disease. 3. Other chronic and incidental findings as detailed above. Aortic Atherosclerosis (ICD10-I70.0). Electronically Signed   By: Eddie Candle M.D.   On: 11/27/2018 21:06   Dg Ercp  Result Date: 11/28/2018 CLINICAL DATA:  83 year old female with a history of biliary obstruction EXAM: ERCP TECHNIQUE: Multiple spot images obtained with the fluoroscopic device and submitted for interpretation post-procedure. FLUOROSCOPY TIME:  Fluoroscopy Time: 1 minutes 35 seconds COMPARISON:  None FINDINGS: Limited images during ERCP. Initial image demonstrates endoscope projecting over the upper abdomen. There is then ampullary cannulation with a safety wire and partial opacification of the extrahepatic biliary system. Abrupt cutoff of the contrast column compatible with findings on prior CT. Final image demonstrates partial decompressed system with plastic biliary stent in position. IMPRESSION: Limited images during ERCP demonstrates treatment of bile duct obstruction with placement of a plastic biliary stent. Please refer to the dictated operative report for full details of intraoperative findings and procedure. Electronically Signed   By: Corrie Mckusick D.O.   On: 11/28/2018 14:40   US Abdomen Limited Ruq  Result Date: 11/27/2018 CLINICAL DATA:  History of gallstones, jaundice, upper abdominal pain, nausea. EXAM: ULTRASOUND ABDOMEN LIMITED RIGHT UPPER QUADRANT COMPARISON:  Abdominal ultrasound 12/08/2015 FINDINGS:  Gallbladder: The gallbladder is distended. There is a mild amount of sludge. No gallbladder wall thickening. No sonographic Murphy sign noted by sonographer. Common bile duct: Diameter: 1.3 cm Liver: No focal lesion identified. Within normal limits in  parenchymal echogenicity. Portal vein is patent on color Doppler imaging with normal direction of blood flow towards the liver. There is intrahepatic biliary duct dilation. Other: There is suggestion of a mass at the head of the pancreas measuring 2.6 x 2.8 by 3.1 cm. The main pancreatic duct is dilated measuring 1.1 cm. IMPRESSION: 1.  Intra and extrahepatic biliary duct dilation. 2. Suggestion of a mass at the head of the pancreas as well as dilation of the main pancreatic duct. Further evaluation with cross-sectional imaging is recommended. 3. Distended gallbladder with a mild amount of sludge. No significant inflammatory changes. Electronically Signed   By: Audie Pinto M.D.   On: 11/27/2018 16:33    Lab Results  Component Value Date   ALT 297 (H) 12/07/2018   AST 309 (H) 12/07/2018   ALKPHOS 334 (H) 12/07/2018   BILITOT 2.8 (H) 12/07/2018   Lab Results  Component Value Date   LIPASE 23.0 12/02/2018   .No results found for: CA125 Lab Results  Component Value Date   LABCA2 20 11/07/2011   Secondary diabetes-  Lab Results  Component Value Date   HGBA1C 7.9 (H) 12/07/2018    Her daughter is a nutritionist so she is helping her with diet.  Today she actually feels "pretty good."  More energy, improved appetite.  Itching has resolved.  Wt Readings from Last 3 Encounters:  12/16/18 130 lb 3.2 oz (59.1 kg)  12/10/18 129 lb 3 oz (58.6 kg)  11/28/18 129 lb 3.4 oz (58.6 kg)      Current Outpatient Medications on File Prior to Visit  Medication Sig Dispense Refill  . acetaminophen (TYLENOL) 650 MG CR tablet Take 650 mg by mouth every 8 (eight) hours as needed for pain.    [name] albuterol (VENTOLIN HFA) 108 (90 Base) MCG/ACT inhaler  Inhale 2 puffs into the lungs every 6 (six) hours as needed for wheezing or shortness of breath.    [name] aspirin EC 81 MG tablet Take 81 mg by mouth daily.    [name] CALCIUM PO Take 1 tablet by mouth daily with breakfast.     . EPINEPHrine 0.3 mg/0.3 mL IJ SOAJ injection Inject 0.3 mg into the muscle as needed (as directed- FOR A SEVERE REACTION).     [name] fexofenadine (ALLEGRA) 180 MG tablet Take 180 mg by mouth daily as needed (for seasonal allergies).     . fluticasone (FLONASE) 50 MCG/ACT nasal spray Place 1-2 sprays into both nostrils daily as needed for allergies or rhinitis.    . Glucosamine-Chondroit-Vit C-Mn (GLUCOSAMINE CHONDR 1500 COMPLX PO) Take 1 tablet by mouth 3 (three) times daily.      . hydrALAZINE (APRESOLINE) 50 MG tablet Take 0.5 tablets (25 mg total) by mouth 3 (three) times daily. (Patient taking differently: Take 25 mg by mouth 2 (two) times daily. ) 135 tablet 1  . latanoprost (XALATAN) 0.005 % ophthalmic solution Place 1 drop into both eyes at bedtime.     [name] losartan (COZAAR) 100 MG tablet TAKE 1/2 TABLET BY MOUTH THREE TIMES A DAY (Patient taking differently: Take 50 mg by mouth 2 (two) times daily. ) 135 tablet 0  . metoprolol tartrate (LOPRESSOR) 50 MG tablet TAKE 1 TABLET BY MOUTH THREE TIMES A DAY (Patient taking differently: Take 50 mg by mouth 3 (three) times daily. ) 270 tablet 3  . nitroGLYCERIN (NITROSTAT) 0.4 MG SL tablet Place 1 tablet (0.4 mg total) under the tongue every 5 (five) minutes as needed for chest pain. 25  tablet 2  . pantoprazole (PROTONIX) 40 MG tablet Take 1 tablet (40 mg total) by mouth 2 (two) times daily. Take 30-60 minutes before breakfast 60 tablet 11  . Psyllium (METAMUCIL FIBER PO) Take by mouth See admin instructions. Mix one rounded teaspoonful into 8 oz water and drink once a day    . rosuvastatin (CRESTOR) 5 MG tablet TAKE 1 TABLET BY MOUTH 3 TIMES A WEEK. (TAKE ON MONDAY, WEDNESDAY, AND FRIDAY) (Patient taking differently: Take 5 mg by mouth every  Monday, Wednesday, and Friday. ) 30 tablet 5  . sodium chloride (OCEAN) 0.65 % SOLN nasal spray Place 1 spray into both nostrils as needed for congestion.    . sucralfate (CARAFATE) 1 g tablet TAKE 1 TABLET BY MOUTH BY MOUTH TWICE DAILY(ONE BEFORE DINNER) (Patient taking differently: Take 1 g by mouth 2 (two) times daily before a meal. ) 60 tablet 1  . traMADol (ULTRAM) 50 MG tablet Take 1/2 to 1 tablet by mouth three times daily as needed 30 tablet 1  . vitamin B-12 (CYANOCOBALAMIN) 1000 MCG tablet Take 1,000 mcg by mouth daily.      [name] VITAMIN D PO Take 2 tablets by mouth daily with breakfast.      No current facility-administered medications on file prior to visit.     Allergies  Allergen Reactions  . Levaquin [Levofloxacin In D5w]     Elevated BP  . Choline Fenofibrate Other (See Comments)     pt states INTOL to Trilipix w/ "thigh burning"  . Hctz [Hydrochlorothiazide]     Causes hyponatremia  . Simvastatin Other (See Comments)     pt states INTOL to STATINS \\T \ refuses to restart  . Adhesive [Tape] Rash  . Bentyl [Dicyclomine Hcl] Other (See Comments)    "Made me feel weird and drained me"  . Ceclor [Cefaclor] Rash  . Clarithromycin Rash  . Codeine Nausea Only  . Doxycycline Rash  . Hydrocodone     Nightmare after taking cough syrup w/hydrocodone  . Lisinopril Cough    Developed ACE cough...  . Penicillins Itching, Rash and Other (See Comments)    At injection site Did it involve swelling of the face/tongue/throat, SOB, or low BP? No Did it involve sudden or severe rash/hives, skin peeling, or any reaction on the inside of your mouth or nose? No Did you need to seek medical attention at a hospital or doctor's office? No When did it last happen?"It was a long time ago" If all above answers are "NO", may proceed with cephalosporin use.   . Tobramycin-Dexamethasone Rash    Past Medical History:  Diagnosis Date  . Asthma   . Atrophic vaginitis   . Breast cancer, Left  12/20/2010   NO BLOOD PRESSURE CHECKS OR STICKS IN LEFT ARM  . CAD (coronary artery disease)    a. 01/19/2010 s/p CABG x 3, lima->lad, vg->diag, vg->om1;  b. 06/2011 :Lexi MV: EF 84%, No ischemia. c. 01/06/14 s/p negative nuclear stress test with EF >70%  . Cervical arthritis   . Colonic polyp 04-27-2009   tubular adenoma  . Diverticulosis of colon (without mention of hemorrhage)   . Fibromyalgia   . Gallstones   . GERD (gastroesophageal reflux disease)   . Glaucoma   . Headache(784.0)    a. frequently assocaited with high BPs  . Hiatal hernia   . Hypercholesterolemia   . Irritable bowel syndrome   . Labile hypertension   . Lymphedema    left arm  .  Pinched vertebral nerve   . Secondary diabetes ([place]) 12/03/2018    Past Surgical History:  Procedure Laterality Date  . [place] STUDY N/A 01/08/2016   Procedure: [place] STUDY;  Surgeon: Ladene Artist, MD;  Location: [facility] ENDOSCOPY;  Service: Endoscopy;  Laterality: N/A;  . BILIARY BRUSHING  11/28/2018   Procedure: BILIARY BRUSHING;  Surgeon: Gatha Mayer, MD;  Location: [facility] ENDOSCOPY;  Service: Endoscopy;;  . BILIARY STENT PLACEMENT  11/28/2018   Procedure: BILIARY STENT PLACEMENT;  Surgeon: Gatha Mayer, MD;  Location: [facility] ENDOSCOPY;  Service: Endoscopy;;  . BREAST LUMPECTOMY Left 02/06/11  . CATARACT EXTRACTION, BILATERAL     bilateral caaract removal,  . COLONOSCOPY    . CORONARY ANGIOPLASTY WITH STENT PLACEMENT     Stent 2007  . CORONARY ARTERY BYPASS GRAFT    . ERCP N/A 11/28/2018   Procedure: ENDOSCOPIC RETROGRADE CHOLANGIOPANCREATOGRAPHY (ERCP);  Surgeon: Gatha Mayer, MD;  Location: [facility] ENDOSCOPY;  Service: Endoscopy;  Laterality: N/A;  . ESOPHAGEAL MANOMETRY N/A 01/08/2016   Procedure: ESOPHAGEAL MANOMETRY (EM);  Surgeon: Ladene Artist, MD;  Location: [facility] ENDOSCOPY;  Service: Endoscopy;  Laterality: N/A;  . ESOPHAGOGASTRODUODENOSCOPY    . ESOPHAGOGASTRODUODENOSCOPY (EGD) WITH PROPOFOL N/A 12/10/2018    Procedure: ESOPHAGOGASTRODUODENOSCOPY (EGD) WITH PROPOFOL;  Surgeon: [name], MD;  Location: [facility] ENDOSCOPY;  Service: Endoscopy;  Laterality: N/A;  . EUS N/A 12/10/2018   Procedure: UPPER ENDOSCOPIC ULTRASOUND (EUS) RADIAL;  Surgeon: [name], MD;  Location: [facility] ENDOSCOPY;  Service: Endoscopy;  Laterality: N/A;  . FINE NEEDLE ASPIRATION N/A 12/10/2018   Procedure: FINE NEEDLE ASPIRATION (FNA) LINEAR;  Surgeon: [name], MD;  Location: [facility] ENDOSCOPY;  Service: Endoscopy;  Laterality: N/A;    Family History  Problem Relation Age of Onset  . Stroke Mother   . Stroke Father   . Prostate cancer Father   . Breast cancer Sister   . Diabetes Sister   . Cancer Brother        gland cancer  . Heart disease Brother   . Colon cancer Neg Hx   . Stomach cancer Neg Hx   . Pancreatic cancer Neg Hx   . Kidney disease Neg Hx   . Liver disease Neg Hx     Social History   Socioeconomic History  . Marital status: Married    Spouse name: Gildardo Griffes. [place]  . Number of children: 2  . Years of education: 29  . Highest education level: Not on file  Occupational History  . Occupation: retired    Fish farm manager: [employer]  Social Needs  . Financial resource strain: Not on file  . Food insecurity    Worry: Not on file    Inability: Not on file  . Transportation needs    Medical: Not on file    Non-medical: Not on file  Tobacco Use  . Smoking status: Never Smoker  . Smokeless tobacco: Never Used  Substance and Sexual Activity  . Alcohol use: No    Alcohol/week: 0.0 standard drinks  . Drug use: No  . Sexual activity: Not Currently    Partners: Male    Birth control/protection: Post-menopausal  Lifestyle  . Physical activity    Days per week: Not on file    Minutes per session: Not on file  . Stress: Not on file  Relationships  . Social Herbalist on phone: Not on file    Gets together: Not on file  Attends religious service: Not on file     Active member of club or organization: Not on file    Attends meetings of clubs or organizations: Not on file    Relationship status: Not on file  . Intimate partner violence    Fear of current or ex partner: Not on file    Emotionally abused: Not on file    Physically abused: Not on file    Forced sexual activity: Not on file  Other Topics Concern  . Not on file  Social History Narrative   Patient lives at home with her husband Marcello Moores). 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    Caffeine- sometimes- One cup of coffee.   Right handed.   Two daughters   Four granddaughters.   No EtOH, Tobacco, drugs   The PMH, PSH, Social History, Family History, Medications, and allergies have been reviewed in [facility] and have been updated if relevant.   Review of Systems  Constitutional: Negative.   HENT: Negative.   Respiratory: Negative.   Cardiovascular: Negative.   Gastrointestinal: Negative.   Endocrine: Negative.   Genitourinary: Negative.   Musculoskeletal: Negative.   Allergic/Immunologic: Negative.   Neurological: Negative.   Hematological: Negative.   Psychiatric/Behavioral: Negative.   All other systems reviewed and are negative.      Objective:    BP 120/60 (BP Location: Right Arm, Patient Position: Sitting, Cuff Size: Normal)   Pulse (!) 51   Temp 97.9 F (36.6 C) (Oral)   Ht 4\' 10"  (1.473 m)   Wt 130 lb 3.2 oz (59.1 kg)   LMP 11/26/1990   SpO2 97%   BMI 27.21 kg/m   Wt Readings from Last 3 Encounters:  12/16/18 130 lb 3.2 oz (59.1 kg)  12/10/18 129 lb 3 oz (58.6 kg)  11/28/18 129 lb 3.4 oz (58.6 kg)    Physical Exam Vitals signs and nursing note reviewed.  Constitutional:      General: She is not in acute distress.    Appearance: Normal appearance.  HENT:     Head: Normocephalic and atraumatic.     Right Ear: External ear normal.     Left Ear: External ear normal.     Nose: Nose normal.     Mouth/Throat:      Mouth: Mucous membranes are moist.  Eyes:     General: No scleral icterus.    Extraocular Movements: Extraocular movements intact.  Cardiovascular:     Rate and Rhythm: Normal rate.  Pulmonary:     Effort: Pulmonary effort is normal.  Musculoskeletal: Normal range of motion.  Skin:    General: Skin is warm and dry.  Neurological:     General: No focal deficit present.     Mental Status: She is alert and oriented to person, place, and time.  Psychiatric:        Mood and Affect: Mood normal.        Behavior: Behavior normal.        Thought Content: Thought content normal.        Judgment: Judgment normal.           Assessment & Plan:   Adenocarcinoma of head of pancreas ([place]) - Plan: Comprehensive metabolic panel, Lipase, Ammonia, Microalbumin / creatinine urine ratio  Secondary diabetes ([place]) - Plan: Hemoglobin A1c  Itching  Transaminitis No follow-ups on file.

## 2018-12-15 NOTE — Progress Notes (Signed)
Patient ID: [name], female   DOB: March 31, 1935, 83 y.o.   MRN: [id number]     83 y.o. with HTN, HLD, Fibromyalgia, IBS and GERD. History of CAD and CABG 2011. 12/2013 nonischemic myovue and normal EF by echo. Tends to get hyponatremic with diuretics. Intolerant to statins and refuses to Take . Some issues with headaches and GERD. Seeing PT for RLE radiculopathy and leg pain Plain films From 09/18/17 reviewed and moderate osteoarthritic joint space disease   Recent w/u for jaundice and pancreatic mass Had ERCP with CBD stent d/c on 12/10/18  She feels much better. Seeing Dr Lorenso Courier and Barry Dienes this week Pruritis and jaundice are gone Some palpitations that sound benign No angina or dyspnea   ROS: Denies fever, malais, weight loss, blurry vision, decreased visual acuity, cough, sputum, SOB, hemoptysis, pleuritic pain, palpitaitons, heartburn, abdominal pain, melena, lower extremity edema, claudication, or rash.  All other systems reviewed and negative  General: Vitals:   12/18/18 0823  BP: (!) 142/7  Pulse: 67  SpO2: 99%    Affect appropriate Obese white female  HEENT: normal Neck supple with no adenopathy JVP normal no bruits no thyromegaly Lungs clear with no wheezing and good diaphragmatic motion Heart:  S1/S2 no murmur, no rub, gallop or click PMI normal Abdomen: benighn, BS positve, no tenderness, no AAA no bruit.  No HSM or HJR Distal pulses intact with no bruits No edema Neuro non-focal Skin warm and dry No muscular weakness     Current Outpatient Medications  Medication Sig Dispense Refill  . acetaminophen (TYLENOL) 650 MG CR tablet Take 650 mg by mouth every 8 (eight) hours as needed for pain.    [name] albuterol (VENTOLIN HFA) 108 (90 Base) MCG/ACT inhaler Inhale 2 puffs into the lungs every 6 (six) hours as needed for wheezing or shortness of breath.    [name] amLODipine (NORVASC) 5 MG tablet Take 5 mg by mouth daily.    [name] aspirin EC 81 MG tablet Take 81 mg by  mouth daily.    . blood glucose meter kit and supplies KIT Dispense based on patient and insurance preference.Check fasting blood sugars daily (FOR ICD-9 250.00, 250.01). 1 each 0  . CALCIUM PO Take 1 tablet by mouth daily with breakfast.     . EPINEPHrine 0.3 mg/0.3 mL IJ SOAJ injection Inject 0.3 mg into the muscle as needed (as directed- FOR A SEVERE REACTION).     [name] fexofenadine (ALLEGRA) 180 MG tablet Take 180 mg by mouth daily as needed (for seasonal allergies).     . fluticasone (FLONASE) 50 MCG/ACT nasal spray Place 1-2 sprays into both nostrils daily as needed for allergies or rhinitis.    . Glucosamine-Chondroit-Vit C-Mn (GLUCOSAMINE CHONDR 1500 COMPLX PO) Take 1 tablet by mouth 3 (three) times daily.      . hydrALAZINE (APRESOLINE) 50 MG tablet Take 0.5 tablets (25 mg total) by mouth 3 (three) times daily. (Patient taking differently: Take 25 mg by mouth 2 (two) times daily. ) 135 tablet 1  . latanoprost (XALATAN) 0.005 % ophthalmic solution Place 1 drop into both eyes at bedtime.     [name] losartan (COZAAR) 100 MG tablet TAKE 1/2 TABLET BY MOUTH THREE TIMES A DAY (Patient taking differently: Take 50 mg by mouth 2 (two) times daily. ) 135 tablet 0  . metoprolol tartrate (LOPRESSOR) 50 MG tablet TAKE 1 TABLET BY MOUTH THREE TIMES A DAY (Patient taking differently: Take 50 mg by mouth 3 (three) times daily. )  270 tablet 3  . nitroGLYCERIN (NITROSTAT) 0.4 MG SL tablet Place 1 tablet (0.4 mg total) under the tongue every 5 (five) minutes as needed for chest pain. 25 tablet 2  . pantoprazole (PROTONIX) 40 MG tablet Take 1 tablet (40 mg total) by mouth 2 (two) times daily. Take 30-60 minutes before breakfast 60 tablet 11  . Psyllium (METAMUCIL FIBER PO) Take by mouth See admin instructions. Mix one rounded teaspoonful into 8 oz water and drink once a day    . rosuvastatin (CRESTOR) 5 MG tablet TAKE 1 TABLET BY MOUTH 3 TIMES A WEEK. (TAKE ON MONDAY, WEDNESDAY, AND FRIDAY) (Patient taking differently:  Take 5 mg by mouth every Monday, Wednesday, and Friday. ) 30 tablet 5  . sodium chloride (OCEAN) 0.65 % SOLN nasal spray Place 1 spray into both nostrils as needed for congestion.    . sucralfate (CARAFATE) 1 g tablet TAKE 1 TABLET BY MOUTH BY MOUTH TWICE DAILY(ONE BEFORE DINNER) (Patient taking differently: Take 1 g by mouth 2 (two) times daily before a meal. ) 60 tablet 1  . traMADol (ULTRAM) 50 MG tablet Take 1/2 to 1 tablet by mouth three times daily as needed 30 tablet 1  . vitamin B-12 (CYANOCOBALAMIN) 1000 MCG tablet Take 1,000 mcg by mouth daily.      [name] VITAMIN D PO Take 2 tablets by mouth daily with breakfast.      No current facility-administered medications for this visit.     Allergies  Levaquin [levofloxacin in d5w], Choline fenofibrate, Hctz [hydrochlorothiazide], Simvastatin, Adhesive [tape], Bentyl [dicyclomine hcl], Ceclor [cefaclor], Clarithromycin, Codeine, Doxycycline, Hydrocodone, Lisinopril, Penicillins, and Tobramycin-dexamethasone  Electrocardiogram:   11/21/17 SR rate 57 nonspecific ST changes 12/17/18 SR arte 67 normal   Assessment and Plan  CAD/CABG: 2011  non ishcemic myovue 2015  She will call if has to take nitro continue ASA/Beta blocker Some esophageal dysmotility causes dysphagia and ? Chest pain that confuses picture  HTN:  Improved on amlodipine   Breast Cancer   F/U Magrinat No BP's on left arm  mamogram last year ok  Chol:   Lab Results  Component Value Date   LDLCALC 140 (H) 04/13/2018   Labs with primary Intolerant to statin    Pancreatic Cancer:  F/'u oncology and surgery clear to have Whipple or other Rx can be available at [facility] to help with any cardiac issues   F/U with me in 3 months or in hospital if needed   [facility]

## 2018-12-16 ENCOUNTER — Ambulatory Visit (INDEPENDENT_AMBULATORY_CARE_PROVIDER_SITE_OTHER): Payer: Medicare Other | Admitting: Family Medicine

## 2018-12-16 ENCOUNTER — Other Ambulatory Visit: Payer: Self-pay

## 2018-12-16 ENCOUNTER — Encounter: Payer: Self-pay | Admitting: Family Medicine

## 2018-12-16 VITALS — BP 120/60 | HR 51 | Temp 97.9°F | Ht <= 58 in | Wt 130.2 lb

## 2018-12-16 DIAGNOSIS — L299 Pruritus, unspecified: Secondary | ICD-10-CM | POA: Diagnosis not present

## 2018-12-16 DIAGNOSIS — C250 Malignant neoplasm of head of pancreas: Secondary | ICD-10-CM

## 2018-12-16 DIAGNOSIS — E139 Other specified diabetes mellitus without complications: Secondary | ICD-10-CM

## 2018-12-16 DIAGNOSIS — R7401 Elevation of levels of liver transaminase levels: Secondary | ICD-10-CM

## 2018-12-16 DIAGNOSIS — I25701 Atherosclerosis of coronary artery bypass graft(s), unspecified, with angina pectoris with documented spasm: Secondary | ICD-10-CM

## 2018-12-16 LAB — COMPREHENSIVE METABOLIC PANEL
ALT: 36 U/L — ABNORMAL HIGH (ref 0–35)
AST: 19 U/L (ref 0–37)
Albumin: 3.9 g/dL (ref 3.5–5.2)
Alkaline Phosphatase: 155 U/L — ABNORMAL HIGH (ref 39–117)
BUN: 11 mg/dL (ref 6–23)
CO2: 29 mEq/L (ref 19–32)
Calcium: 9.3 mg/dL (ref 8.4–10.5)
Chloride: 98 mEq/L (ref 96–112)
Creatinine, Ser: 0.63 mg/dL (ref 0.40–1.20)
GFR: 90.63 mL/min (ref >60.00)
Glucose, Bld: 136 mg/dL — ABNORMAL HIGH (ref 70–99)
Potassium: 4 mEq/L (ref 3.5–5.1)
Sodium: 134 mEq/L — ABNORMAL LOW (ref 135–145)
Total Bilirubin: 1.4 mg/dL — ABNORMAL HIGH (ref 0.2–1.2)
Total Protein: 6.8 g/dL (ref 6.0–8.3)

## 2018-12-16 LAB — MICROALBUMIN / CREATININE URINE RATIO
Creatinine,U: 51.6 mg/dL
Microalb Creat Ratio: 1.4 mg/g (ref 0.0–30.0)
Microalb, Ur: <0.7 mg/dL (ref 0.0–1.9)

## 2018-12-16 LAB — AMMONIA: Ammonia: 23 umol/L (ref 11–35)

## 2018-12-16 LAB — LIPASE: Lipase: 8 U/L — ABNORMAL LOW (ref 11.0–59.0)

## 2018-12-16 LAB — HEMOGLOBIN A1C: Hgb A1c MFr Bld: 7.6 % — ABNORMAL HIGH (ref 4.6–6.5)

## 2018-12-16 MED ORDER — BLOOD GLUCOSE MONITOR KIT: PACK | 0 refills | Status: DC

## 2018-12-16 NOTE — Assessment & Plan Note (Signed)
Repeat liver function tests.

## 2018-12-16 NOTE — Progress Notes (Signed)
[place] [phone]   Fax:([phone]) [place] NOTE  Patient Care Team: Lucille Passy, MD as PCP - General (Family Medicine) Josue Hector, MD as PCP - Cardiology (Cardiology) Neldon Mc, MD as Surgeon (General Surgery) Gery Pray, MD (Radiation Oncology) Magrinat, Virgie Dad, MD (Hematology and Oncology) Cameron Sprang, MD as Consulting Physician (Neurology) Arna Snipe, RN as Registered Nurse Arna Snipe, RN as Oncology Nurse Navigator  Hematological/Oncological History  # Pancreatic Adenocarcinoma Stage IA (T1N0M0) 1) 11/26/2018: patient had visit with PCP for pruritis, found to have elevated LFTs 2) 11/27/2018: patient presented to the ED. Underwent CT scan which revealed 1.9 x 1.6 x 1.6 cm cm mass in the head of the pancreas with biliary duct dilation 3) 11/28/2018: ERCP performed to place stent for biliary obstruction  4) 12/10/2018: EUS w/ FNA reveals adenocarcinoma of the pancreas 5) 12/18/2018: establish care with Dr. Lorenso Courier   #Breast Cancer T2 N0, stage IIA invasive ductal carcinoma, grade 3,  ER+/PR+/HER-2 not amplified 1) December 2012:  left lumpectomy and sentinel lymph node biopsy  2) March 2013: completed radiation therapy, started on letrozole 3) Feb 2018: completed letrozole therapy   CHIEF COMPLAINTS/PURPOSE OF CONSULTATION:  Newly diagnosed adenocarcinoma of the pancreas  HISTORY OF PRESENTING ILLNESS:  [name] 83 y.o. female with medical history significant for CAD s/p CABG in 2011, OSA on CPAP, Breast Cancer, and GERD who presents for evaluation of a newly diagnosed adenocarcinoma of the pancreas. [name] was in her normal state of health until 11/26/18 when she developed pruritis which was evaluated by her PCP with a labs. The CMP at the time noted marked elevations of the LFTs prompting her to recommend the patient go to the emergency room. In the emergency room the patient underwent a CT  C/A/P which revealed a 1.9 x 1.6 x 1.6 cm mass in the head of the pancreas with biliary obstruction. GI was consulted and performed ERCP with brushings. A stent was placed but the original brushings were negative for malignancy. Patient subsequently underwent an EUS w/ FNA which after pathological review was found to be consistent with adenocarcinoma of the pancreas. Mrs. Boies was referred to our clinic for further evaluation and management.  On exam today [name] notes she feels well and has been improving since the stent was placed. She is no longer jaundiced and has only mild abdominal pain across the top of her abdomen. She has a prescription of tramadol but has been avoiding it due to concerns about addiction. She does take tylenol 557m x 2 at night to help her sleep. She notes baseline nausea, but no diarrhea or constipation. She is accompanied by her daughter and notes she is nervous about all the information she has received about pancreatic cancer.   On further discussion Mrs. VKluesnernotes she has lived a good fulfilling life. She states she has had a good 83 years, but is in no rush to "leave". She has a good support system with her daughters and husband. She is open to the idea of comfort based care(and her daughter appears open to it as well).   A 10 point ROS was reviewed below.    MEDICAL HISTORY:  Past Medical History:  Diagnosis Date   Asthma    Atrophic vaginitis    Breast cancer, Left 12/20/2010   NO BLOOD PRESSURE CHECKS OR STICKS IN LEFT ARM   CAD (coronary artery disease)    a. 01/19/2010 s/p  CABG x 3, lima->lad, vg->diag, vg->om1;  b. 06/2011 :Lexi MV: EF 84%, No ischemia. c. 01/06/14 s/p negative nuclear stress test with EF >70%   Cervical arthritis    Colonic polyp 04-27-2009   tubular adenoma   Diverticulosis of colon (without mention of hemorrhage)    Fibromyalgia    Gallstones    GERD (gastroesophageal reflux disease)    Glaucoma     Headache(784.0)    a. frequently assocaited with high BPs   Hiatal hernia    Hypercholesterolemia    Irritable bowel syndrome    Labile hypertension    Lymphedema    left arm   Pinched vertebral nerve    Secondary diabetes ([place]) 12/03/2018    SURGICAL HISTORY: Past Surgical History:  Procedure Laterality Date   70 HOUR [place] STUDY N/A 01/08/2016   Procedure: 24 HOUR [place] STUDY;  Surgeon: Ladene Artist, MD;  Location: [facility] ENDOSCOPY;  Service: Endoscopy;  Laterality: N/A;   BILIARY BRUSHING  11/28/2018   Procedure: BILIARY BRUSHING;  Surgeon: Gatha Mayer, MD;  Location: [facility] ENDOSCOPY;  Service: Endoscopy;;   BILIARY STENT PLACEMENT  11/28/2018   Procedure: BILIARY STENT PLACEMENT;  Surgeon: Gatha Mayer, MD;  Location: [facility];  Service: Endoscopy;;   BREAST LUMPECTOMY Left 02/06/11   CATARACT EXTRACTION, BILATERAL     bilateral caaract removal,   COLONOSCOPY     CORONARY ANGIOPLASTY WITH STENT PLACEMENT     Stent 2007   CORONARY ARTERY BYPASS GRAFT     ERCP N/A 11/28/2018   Procedure: ENDOSCOPIC RETROGRADE CHOLANGIOPANCREATOGRAPHY (ERCP);  Surgeon: Gatha Mayer, MD;  Location: [facility] ENDOSCOPY;  Service: Endoscopy;  Laterality: N/A;   ESOPHAGEAL MANOMETRY N/A 01/08/2016   Procedure: ESOPHAGEAL MANOMETRY (EM);  Surgeon: Ladene Artist, MD;  Location: [facility] ENDOSCOPY;  Service: Endoscopy;  Laterality: N/A;   ESOPHAGOGASTRODUODENOSCOPY     ESOPHAGOGASTRODUODENOSCOPY (EGD) WITH PROPOFOL N/A 12/10/2018   Procedure: ESOPHAGOGASTRODUODENOSCOPY (EGD) WITH PROPOFOL;  Surgeon: [name], MD;  Location: [facility] ENDOSCOPY;  Service: Endoscopy;  Laterality: N/A;   EUS N/A 12/10/2018   Procedure: UPPER ENDOSCOPIC ULTRASOUND (EUS) RADIAL;  Surgeon: [name], MD;  Location: [facility] ENDOSCOPY;  Service: Endoscopy;  Laterality: N/A;   FINE NEEDLE ASPIRATION N/A 12/10/2018   Procedure: FINE NEEDLE ASPIRATION (FNA) LINEAR;  Surgeon: [name], MD;  Location: [facility]  ENDOSCOPY;  Service: Endoscopy;  Laterality: N/A;    SOCIAL HISTORY: Social History   Socioeconomic History   Marital status: Married    Spouse name: Gildardo Griffes. [place]   Number of children: 2   Years of education: 12   Highest education level: Not on file  Occupational History   Occupation: retired    Fish farm manager: [employer]  Social Needs   Financial resource strain: Not on file   Food insecurity    Worry: Not on file    Inability: Not on file   Transportation needs    Medical: Not on file    Non-medical: Not on file  Tobacco Use   Smoking status: Never Smoker   Smokeless tobacco: Never Used  Substance and Sexual Activity   Alcohol use: No    Alcohol/week: 0.0 standard drinks   Drug use: No   Sexual activity: Not Currently    Partners: Male    Birth control/protection: Post-menopausal  Lifestyle   Physical activity    Days per week: Not on file    Minutes per session: Not on file   Stress: Not on file  Relationships   Social Herbalist on phone: Not on file    Gets together: Not on file    Attends religious service: Not on file    Active member of club or organization: Not on file    Attends meetings of clubs or organizations: Not on file    Relationship status: Not on file   Intimate partner violence    Fear of current or ex partner: Not on file    Emotionally abused: Not on file    Physically abused: Not on file    Forced sexual activity: Not on file  Other Topics Concern   Not on file  Social History Narrative   Patient lives at home with her husband Marcello Moores). 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    Caffeine- sometimes- One cup of coffee.   Right handed.   Two daughters   Four granddaughters.   No EtOH, Tobacco, drugs    FAMILY HISTORY: Family History  Problem Relation Age of Onset   Stroke Mother    Stroke Father    Prostate cancer Father    Breast cancer  Sister    Diabetes Sister    Cancer Brother        gland cancer   Heart disease Brother    Colon cancer Neg Hx    Stomach cancer Neg Hx    Pancreatic cancer Neg Hx    Kidney disease Neg Hx    Liver disease Neg Hx     ALLERGIES:  is allergic to levaquin [levofloxacin in d5w]; choline fenofibrate; hctz [hydrochlorothiazide]; simvastatin; adhesive [tape]; bentyl [dicyclomine hcl]; ceclor [cefaclor]; clarithromycin; codeine; doxycycline; hydrocodone; lisinopril; penicillins; and tobramycin-dexamethasone.  MEDICATIONS:  Current Outpatient Medications  Medication Sig Dispense Refill   acetaminophen (TYLENOL) 650 MG CR tablet Take 650 mg by mouth every 8 (eight) hours as needed for pain.     albuterol (VENTOLIN HFA) 108 (90 Base) MCG/ACT inhaler Inhale 2 puffs into the lungs every 6 (six) hours as needed for wheezing or shortness of breath.     amLODipine (NORVASC) 5 MG tablet Take 1 tablet (5 mg total) by mouth daily. 90 tablet 3   aspirin EC 81 MG tablet Take 81 mg by mouth daily.     blood glucose meter kit and supplies KIT Dispense based on patient and insurance preference.Check fasting blood sugars daily (FOR ICD-9 250.00, 250.01). 1 each 0   CALCIUM PO Take 1 tablet by mouth daily with breakfast.      EPINEPHrine 0.3 mg/0.3 mL IJ SOAJ injection Inject 0.3 mg into the muscle as needed (as directed- FOR A SEVERE REACTION).      fexofenadine (ALLEGRA) 180 MG tablet Take 180 mg by mouth daily as needed (for seasonal allergies).      fluticasone (FLONASE) 50 MCG/ACT nasal spray Place 1-2 sprays into both nostrils daily as needed for allergies or rhinitis.     Glucosamine-Chondroit-Vit C-Mn (GLUCOSAMINE CHONDR 1500 COMPLX PO) Take 1 tablet by mouth 3 (three) times daily.       hydrALAZINE (APRESOLINE) 50 MG tablet Take 0.5 tablets (25 mg total) by mouth 3 (three) times daily. (Patient taking differently: Take 25 mg by mouth 2 (two) times daily. ) 135 tablet 1   latanoprost  (XALATAN) 0.005 % ophthalmic solution Place 1 drop into both eyes at bedtime.      losartan (COZAAR) 100 MG tablet TAKE 1/2 TABLET BY MOUTH THREE TIMES  A DAY (Patient taking differently: Take 50 mg by mouth 2 (two) times daily. ) 135 tablet 0   metoprolol tartrate (LOPRESSOR) 50 MG tablet TAKE 1 TABLET BY MOUTH THREE TIMES A DAY (Patient taking differently: Take 50 mg by mouth 3 (three) times daily. ) 270 tablet 3   nitroGLYCERIN (NITROSTAT) 0.4 MG SL tablet Place 1 tablet (0.4 mg total) under the tongue every 5 (five) minutes as needed for chest pain. 25 tablet 2   pantoprazole (PROTONIX) 40 MG tablet Take 1 tablet (40 mg total) by mouth 2 (two) times daily. Take 30-60 minutes before breakfast 60 tablet 11   Psyllium (METAMUCIL FIBER PO) Take by mouth See admin instructions. Mix one rounded teaspoonful into 8 oz water and drink once a day     rosuvastatin (CRESTOR) 5 MG tablet TAKE 1 TABLET BY MOUTH 3 TIMES A WEEK. (TAKE ON MONDAY, WEDNESDAY, AND FRIDAY) (Patient taking differently: Take 5 mg by mouth every Monday, Wednesday, and Friday. ) 30 tablet 5   sodium chloride (OCEAN) 0.65 % SOLN nasal spray Place 1 spray into both nostrils as needed for congestion.     sucralfate (CARAFATE) 1 g tablet TAKE 1 TABLET BY MOUTH BY MOUTH TWICE DAILY(ONE BEFORE DINNER) (Patient taking differently: Take 1 g by mouth 2 (two) times daily before a meal. ) 60 tablet 1   traMADol (ULTRAM) 50 MG tablet Take 1/2 to 1 tablet by mouth three times daily as needed 30 tablet 1   vitamin B-12 (CYANOCOBALAMIN) 1000 MCG tablet Take 1,000 mcg by mouth daily.       VITAMIN D PO Take 2 tablets by mouth daily with breakfast.      No current facility-administered medications for this visit.     REVIEW OF SYSTEMS:   Constitutional: ( - ) fevers, ( - )  chills , ( - ) night sweats Eyes: ( - ) blurriness of vision, ( - ) double vision, ( - ) watery eyes Ears, nose, mouth, throat, and face: ( - ) mucositis, ( - ) sore  throat Respiratory: ( - ) cough, ( - ) dyspnea, ( - ) wheezes Cardiovascular: ( - ) palpitation, ( - ) chest discomfort, ( - ) lower extremity swelling Gastrointestinal:  ( - ) nausea, ( - ) heartburn, ( - ) change in bowel habits, (+) abdominal pain Skin: ( - ) abnormal skin rashes (-) jaundice Lymphatics: ( - ) new lymphadenopathy, ( - ) easy bruising Neurological: ( - ) numbness, ( - ) tingling, ( - ) new weaknesses Behavioral/Psych: ( - ) mood change, ( - ) new changes  All other systems were reviewed with the patient and are negative.  PHYSICAL EXAMINATION: ECOG PERFORMANCE STATUS: 2 - Symptomatic, <50% confined to bed  Vitals:   12/18/18 1256  BP: (!) 148/56  Pulse: 62  Resp: 17  Temp: 98.3 F (36.8 C)  SpO2: 96%   Filed Weights   12/18/18 1256  Weight: 129 lb 11.2 oz (58.8 kg)    GENERAL: well appearing elderly Caucasian female in NAD  SKIN: skin color, texture, turgor are normal, no rashes or significant lesions. No jaundice appreciated.  EYES: conjunctiva are pink and non-injected, sclera clear LYMPH:  no palpable lymphadenopathy in the cervical, axillary or inguinal LUNGS: clear to auscultation and percussion with normal breathing effort HEART: regular rate & rhythm and no murmurs and no lower extremity edema ABDOMEN: soft, non-tender, non-distended, normal bowel sounds Musculoskeletal: no cyanosis of digits and no clubbing  PSYCH: alert & oriented x 3, fluent speech NEURO: no focal motor/sensory deficits  LABORATORY DATA:  I have reviewed the data as listed Lab Results  Component Value Date   WBC 8.9 12/07/2018   HGB 11.0 (L) 12/07/2018   HCT 33.1 (L) 12/07/2018   MCV 93.4 12/07/2018   PLT 495.0 (H) 12/07/2018   NEUTROABS 5.9 12/07/2018   PATHOLOGY: CYTOLOGY - NON PAP  CASE: [id number]  PATIENT: Daysia Reisen  Non-Gynecological Cytology Report   Clinical History: 1.9 x 2.5 cm pancreatic mass  Specimen Submitted: A. PANCREAS, HEAD, FINE NEEDLE  ASPIRATION:    DIAGNOSIS:  - Malignant cells consistent with adenocarcinoma   SPECIMEN ADEQUACY:  Satisfactory for evaluation   IMMEDIATE EVALUATION:  1-2) RARE ATYPICAL FRAGMENT (JSM)  3) ADEQUATE (JSM)   DIAGNOSTIC COMMENTS:  Dr. Lyndon Code has reviewed the case.    RADIOGRAPHIC STUDIES: I have personally reviewed the radiological images of CT Chest and CT abdomen from 11/27/2018. I agree with the documented findings in the radiology reports: mass in the head of the pancreas consist with pancreatic cancer.   Dg Chest 2 View  Result Date: 11/27/2018 CLINICAL DATA:  Chronic cough. EXAM: CHEST - 2 VIEW COMPARISON:  Radiographs of June 19, 2017. FINDINGS: The heart size and mediastinal contours are within normal limits. Both lungs are clear. Status post coronary bypass graft. No pneumothorax or pleural effusion is noted. The visualized skeletal structures are unremarkable. IMPRESSION: No active cardiopulmonary disease. Electronically Signed   By: Marijo Conception M.D.   On: 11/27/2018 16:34   Ct Chest W Contrast  Result Date: 11/29/2018 CLINICAL DATA:  Inpatient. Pancreatic mass concerning for malignancy on recent CT abdomen study. Staging evaluation of the chest requested. EXAM: CT CHEST WITH CONTRAST TECHNIQUE: Multidetector CT imaging of the chest was performed during intravenous contrast administration. CONTRAST:  93m OMNIPAQUE IOHEXOL 300 MG/ML  SOLN COMPARISON:  11/27/2018 CT abdomen/pelvis and chest radiograph. FINDINGS: Cardiovascular: Normal heart size. No significant pericardial effusion/thickening. Left anterior descending coronary atherosclerosis status post CABG. Atherosclerotic nonaneurysmal thoracic aorta. Normal caliber pulmonary arteries. No central pulmonary emboli. Mediastinum/Nodes: No discrete thyroid nodules. Unremarkable esophagus. No pathologically enlarged axillary, mediastinal or hilar lymph nodes. Lungs/Pleura: No pneumothorax. No pleural effusion. Calcified  subcentimeter peripheral left lower lobe granuloma. No acute consolidative airspace disease, lung masses or significant pulmonary nodules. Upper abdomen: Expected pneumobilia in the intrahepatic bile ducts status post CBD stent placement at ERCP 1 day prior. Main pancreatic duct dilation up to 9 mm in the pancreatic body as seen on recent CT abdomen study. Subcentimeter hypodense splenic lesions too small to characterize and unchanged. Musculoskeletal: No aggressive appearing focal osseous lesions. Intact sternotomy wires. Marked thoracic spondylosis. IMPRESSION: 1. No evidence of metastatic disease in the chest. 2. Expected pneumobilia in the intrahepatic bile ducts status post CBD stent placement ERCP 1 day prior. Redemonstration of main pancreatic duct dilation due to known pancreatic head mass. Aortic Atherosclerosis (ICD10-I70.0). Electronically Signed   By: JIlona SorrelM.D.   On: 11/29/2018 10:24   Ct Abdomen Pelvis W Contrast  Result Date: 11/27/2018 CLINICAL DATA:  Abnormal ultrasound, biliary ductal dilatation, assess for pancreatic mass EXAM: CT ABDOMEN AND PELVIS WITH CONTRAST TECHNIQUE: Multidetector CT imaging of the abdomen and pelvis was performed using the standard protocol following bolus administration of intravenous contrast. CONTRAST:  837mOMNIPAQUE IOHEXOL 300 MG/ML SOLN, 3073mMNIPAQUE IOHEXOL 300 MG/ML SOLN, additional oral enteric contrast COMPARISON:  Same day right upper quadrant ultrasound, 11/27/2018, CT abdomen pelvis, 05/04/2014  FINDINGS: Lower chest: No acute abnormality. Calcification within the left breast. Hepatobiliary: No solid liver abnormality is seen. There is gross intrahepatic and extrahepatic biliary ductal dilatation with abrupt cut off of the distal common bile duct in the pancreatic head. Distention of the gallbladder. Pancreas: There is an ill-defined, hypodense mass of the pancreatic head measuring 1.9 x 1.6 x 1.6 cm (series 2, image 26, series 5, image 35). Gross  dilatation of the pancreatic duct with abrupt cut off in the pancreatic head at the level of the mass. There may be minimal abutment of the SMV near the portal confluence without definite contact or encasement (series 2, image 23, series 5, image 34). No abutment or encasement of other adjacent vessels. Spleen: Normal in size without significant abnormality. Adrenals/Urinary Tract: Adrenal glands are unremarkable. Kidneys are normal, without renal calculi, solid lesion, or hydronephrosis. Bladder is unremarkable. Stomach/Bowel: Stomach is within normal limits. Incidental diverticulum of the descending portion of the duodenum. Appendix is very diminutive (series 2, image 88). No evidence of bowel wall thickening, distention, or inflammatory changes. Sigmoid diverticulosis. Vascular/Lymphatic: Aortic atherosclerosis. No enlarged abdominal or pelvic lymph nodes. Reproductive: No mass or other significant abnormality. The left ovary appears to be surgically absent. Other: No abdominal wall hernia or abnormality. No abdominopelvic ascites. Musculoskeletal: No acute or significant osseous findings. IMPRESSION: 1. There is an ill-defined, hypodense mass of the pancreatic head measuring 1.9 x 1.6 x 1.6 cm (series 2, image 26, series 5, image 35) with gross biliary and pancreatic ductal dilatation. Findings are consistent with pancreatic adenocarcinoma. There may be minimal abutment of the SMV near the portal confluence without definite contact or encasement (series 2, image 23, series 5, image 34). No abutment or encasement of other adjacent vessels. Pancreatic protocol MRI may be helpful to better assess for resectability if desired. 2.  No evidence of abdominal lymphadenopathy or metastatic disease. 3. Other chronic and incidental findings as detailed above. Aortic Atherosclerosis (ICD10-I70.0). Electronically Signed   By: Eddie Candle M.D.   On: 11/27/2018 21:06   Dg Ercp  Result Date: 11/28/2018 CLINICAL DATA:   83 year old female with a history of biliary obstruction EXAM: ERCP TECHNIQUE: Multiple spot images obtained with the fluoroscopic device and submitted for interpretation post-procedure. FLUOROSCOPY TIME:  Fluoroscopy Time: 1 minutes 35 seconds COMPARISON:  None FINDINGS: Limited images during ERCP. Initial image demonstrates endoscope projecting over the upper abdomen. There is then ampullary cannulation with a safety wire and partial opacification of the extrahepatic biliary system. Abrupt cutoff of the contrast column compatible with findings on prior CT. Final image demonstrates partial decompressed system with plastic biliary stent in position. IMPRESSION: Limited images during ERCP demonstrates treatment of bile duct obstruction with placement of a plastic biliary stent. Please refer to the dictated operative report for full details of intraoperative findings and procedure. Electronically Signed   By: Corrie Mckusick D.O.   On: 11/28/2018 14:40   US Abdomen Limited Ruq  Result Date: 11/27/2018 CLINICAL DATA:  History of gallstones, jaundice, upper abdominal pain, nausea. EXAM: ULTRASOUND ABDOMEN LIMITED RIGHT UPPER QUADRANT COMPARISON:  Abdominal ultrasound 12/08/2015 FINDINGS: Gallbladder: The gallbladder is distended. There is a mild amount of sludge. No gallbladder wall thickening. No sonographic Murphy sign noted by sonographer. Common bile duct: Diameter: 1.3 cm Liver: No focal lesion identified. Within normal limits in parenchymal echogenicity. Portal vein is patent on color Doppler imaging with normal direction of blood flow towards the liver. There is intrahepatic biliary duct dilation. Other: There is  suggestion of a mass at the head of the pancreas measuring 2.6 x 2.8 by 3.1 cm. The main pancreatic duct is dilated measuring 1.1 cm. IMPRESSION: 1.  Intra and extrahepatic biliary duct dilation. 2. Suggestion of a mass at the head of the pancreas as well as dilation of the main pancreatic duct.  Further evaluation with cross-sectional imaging is recommended. 3. Distended gallbladder with a mild amount of sludge. No significant inflammatory changes. Electronically Signed   By: Audie Pinto M.D.   On: 11/27/2018 16:33    ASSESSMENT & PLAN Mrs. Blakelee Allington. Leiner is an 83 year old female with medical history significant for CAD s/p CABG in 2011, OSA on CPAP, Breast Cancer, and GERD who presents for evaluation of a newly diagnosed adenocarcinoma of the pancreas. Initial review of the scan indicate that the mass is isolated to the head of the pancreas and has the potential to be resectable, however the resection of a pancreatic head mass (likely a Whipple) would be a massive surgery for an 83 year old patient with her co-morbidities (including CAD s/p CABG and OSA). The patient has an ECOG of 2, which would make her a feasible candidate for monotherapy gemcitabine, though as I discussed with the patient this is a palliative chemotherapy that has relatively low efficacy, with an OS of 7.1 months (Journal of Clinical Oncology 2005 23:15, [id number]).   Based on the current imaging the patient is a Stage IA (T1N0M0) pancreatic adenocarcinoma. Despite early stage, the prognosis is still markedly poor (a 5 year survival rate of 5% among all patients with pancreatic cancer). Interestingly the patient has no CA 19-9 elevation, however up to 30% of pancreatic adenocarcinomas present with no elevations of the CA 19-9 ([facility] Handbook Clinical Oncology).   The major divergence in the current treatment plan of this patient is deciding whether or not she is a surgical candidate. I would favor a palliative approach in this patient, however we will give the patient time to discuss with her family and surgery before deciding on a treatment plan.   #Newly Diagnosed Adenocarcinoma of the Pancreas, Stage IA (T1N0M0) --appreciate surgical evaluation by Dr. Barry Dienes for consideration of resection. Patient is elderly  with co-morbidities and may not be a surgical candidate --after reviewing patients functional status and ECOG 2 I think the patient is possible a candidate for chemotherapy (likely only monotherapy gemcitabine (J Clin Oncol. 1997 Jun; 15(6):2403-13.)), though I do not favor that approach --I discussed with her the overall poor prognosis in addition to the toxicity of the chemotherapy and intensive surgery required. She and her family will consider the options moving forward.  --also discussed palliative only care moving forward. We discussed that if she did not wish or was not able to pursue chemotherapy and surgery that we would focus on treating her symptoms with supportive care only --RTC in 2 weeks following discussion with surgery.   #Breast Cancer T2 N0, stage IIA invasive ductal carcinoma, grade 3,  ER+/PR+/HER-2 not amplified --completed 83 year old letrozole therapy in Feb 2018 --previously followed by [facility]. Gunnar Bulla [facility]at [facility] [facility] Related Pain --reports 1/10 pain today across the top of the abdomen. --has a prescription for tramadol and takes 2 x 551m tylenol prior to bed --LFTs have normalized, OK to take tylenol at above dosage --will continue to monitor for abdominal pain  #Symptom Management --no symptoms requiring management today --mild nausea, at patient's baseline. No diarrhea  #Goals Of Care --introduced the idea of  support care only palliative approach to her care --patient and family will think about how to move forward after discussion with surgery.   All questions were answered. The patient knows to call the clinic with any problems, questions or concerns.  A total of more than 60 minutes were spent face-to-face with the patient during this encounter and over half of that time was spent on counseling and coordination of care as outlined above.   Ledell Peoples, MD Department of Hematology/Oncology [place] at [facility] Phone:  [phone] Pager: [phone] Email: [email or web address]   12/18/2018 2:07 PM   Literature Support  Gemcitabine in Combination With Oxaliplatin Compared With Gemcitabine Alone in Locally Advanced or Metastatic Pancreatic Cancer: Results of a GERCOR and GISCAD Phase III Trial C. Louvet, R. Labianca, P. Hammel, G. Lledo, M.G. Zampino, T. Andr, A. Zaniboni, M. Ducreux, E. Aitini, J. Taeb, R. Faroux, C. Dulce Sellar, and ASilas [place] Journal of Clinical Oncology 2005 23:15, [phone]  --Median overall survival (OS) for GemOx and Gem was 9.0 and 7.1 months, respectively (P = .13)  Burris HA 3rd, Moore MJ, [employer], Green MR, [employer], Modiano MR, Cripps MC, Portenoy RK, Storniolo AM, Tarassoff P, [employer], Dorr FA, Minette Brine CD, Von Hoff DD. Improvements in survival and clinical benefit with gemcitabine as first-line therapy for patients with advanced pancreas cancer: a randomized trial. J Clin Oncol. 1997 Jun;15(6):2403-13. doi: 10.1200/JCO.1997.15.6.2403. PMID: [id number].  --Clinical benefit response was experienced by 23.8% of gemcitabine-treated patients compared with 4.8% of 5-FU-treated patients (P = .0022). The median survival durations were 5.65 and 4.41 months for gemcitabine-treated and 5-FU-treated patients, respectively (P = .0025).

## 2018-12-16 NOTE — Assessment & Plan Note (Signed)
We talked at length about diet- given eat right diet, daughter is nutritionist.  As long as a1 remains around 8 or below, I do not feel strongly about starting medication as this will fluctuate with her surgeries, etc.  She declined diabetic teaching but did as for a glucometer which I did sent to her pharmacy and she will update mw fasting blood sugars at least 3 times weekly or if she feels shaky or "off." The patient indicates understanding of these issues and agrees with the plan. Orders Placed This Encounter  Procedures  . Comprehensive metabolic panel  . Lipase  . Ammonia  . Hemoglobin A1c  . Microalbumin / creatinine urine ratio

## 2018-12-16 NOTE — Patient Instructions (Addendum)
Great to see you. I will call you with your lab results from today and you can view them online.   1.9cm by 2.5cm solid mass in the head of pancreas with associated, suspicious appearing 6.34mm periportal lymphnode. The mass is causing main pancreatic duct obstruction, dilation. Previously placed plastic biliary stent is in position in the CBD, I was never able to view the major papilla however to confirm the stent extends into the duodenum. The mass directly abuts the SMV, concerning for invasion.  Rreliminary cytology review of the FNA of the periportal lymphnode was non-diagnotic.  Preliminary cytology review of FNA of the head of pancreas mass shows malignant cells (adenocarcinoma).  Pathology showed:  Clinical History: 1.9 x 2.5 cm pancreatic mass  Specimen Submitted: A. PANCREAS, HEAD, FINE NEEDLE ASPIRATION:    DIAGNOSIS:  - Malignant cells consistent with adenocarcinoma    We are rechecking your liver function, blood sugar and other labs today.  I have sent a glucometer to your pharmacy. Blood Glucose Monitoring, Adult Monitoring your blood sugar (glucose) is an important part of managing your diabetes (diabetes mellitus). Blood glucose monitoring involves checking your blood glucose as often as directed and keeping a record (log) of your results over time. Checking your blood glucose regularly and keeping a blood glucose log can:  Help you and your health care provider adjust your diabetes management plan as needed, including your medicines or insulin.  Help you understand how food, exercise, illnesses, and medicines affect your blood glucose.  Let you know what your blood glucose is at any time. You can quickly find out if you have low blood glucose (hypoglycemia) or high blood glucose (hyperglycemia). Your health care provider will set individualized treatment goals for you. Your goals will be based on your age, other medical conditions you have, and how you respond to diabetes  treatment. Generally, the goal of treatment is to maintain the following blood glucose levels:  Before meals (preprandial): 80-130 mg/dL (4.4-7.2 mmol/L).  After meals (postprandial): below 180 mg/dL (10 mmol/L).  A1c level: less than 7%. Supplies needed:  Blood glucose meter.  Test strips for your meter. Each meter has its own strips. You must use the strips that came with your meter.  A needle to prick your finger (lancet). Do not use a lancet more than one time.  A device that holds the lancet (lancing device).  A journal or log book to write down your results. How to check your blood glucose  1. Wash your hands with soap and water. 2. Prick the side of your finger (not the tip) with the lancet. Use a different finger each time. 3. Gently rub the finger until a small drop of blood appears. 4. Follow instructions that come with your meter for inserting the test strip, applying blood to the strip, and using your blood glucose meter. 5. Write down your result and any notes. Some meters allow you to use areas of your body other than your finger (alternative sites) to test your blood. The most common alternative sites are:  Forearm.  Thigh.  Palm of the hand. If you think you may have hypoglycemia, or if you have a history of not knowing when your blood glucose is getting low (hypoglycemia unawareness), do not use alternative sites. Use your finger instead. Alternative sites may not be as accurate as the fingers, because blood flow is slower in these areas. This means that the result you get may be delayed, and it may be different  from the result that you would get from your finger. Follow these instructions at home: Blood glucose log   Every time you check your blood glucose, write down your result. Also write down any notes about things that may be affecting your blood glucose, such as your diet and exercise for the day. This information can help you and your health care  provider: ? Look for patterns in your blood glucose over time. ? Adjust your diabetes management plan as needed.  Check if your meter allows you to download your records to a computer. Most glucose meters store a record of glucose readings in the meter. If you have type 1 diabetes:  Check your blood glucose 2 or more times a day.  Also check your blood glucose: ? Before every insulin injection. ? Before and after exercise. ? Before meals. ? 2 hours after a meal. ? Occasionally between 2:00 a.m. and 3:00 a.m., as directed. ? Before potentially dangerous tasks, like driving or using heavy machinery. ? At bedtime.  You may need to check your blood glucose more often, up to 6-10 times a day, if you: ? Use an insulin pump. ? Need multiple daily injections (MDI). ? Have diabetes that is not well-controlled. ? Are ill. ? Have a history of severe hypoglycemia. ? Have hypoglycemia unawareness. If you have type 2 diabetes:  If you take insulin or other diabetes medicines, check your blood glucose 2 or more times a day.  If you are on intensive insulin therapy, check your blood glucose 4 or more times a day. Occasionally, you may also need to check between 2:00 a.m. and 3:00 a.m., as directed.  Also check your blood glucose: ? Before and after exercise. ? Before potentially dangerous tasks, like driving or using heavy machinery.  You may need to check your blood glucose more often if: ? Your medicine is being adjusted. ? Your diabetes is not well-controlled. ? You are ill. General tips  Always keep your supplies with you.  If you have questions or need help, all blood glucose meters have a 24-hour "hotline" phone number that you can call. You may also contact your health care provider.  After you use a few boxes of test strips, adjust (calibrate) your blood glucose meter by following instructions that came with your meter. Contact a health care provider if:  Your blood glucose is  at or above 240 mg/dL (13.3 mmol/L) for 2 days in a row.  You have been sick or have had a fever for 2 days or longer, and you are not getting better.  You have any of the following problems for more than 6 hours: ? You cannot eat or drink. ? You have nausea or vomiting. ? You have diarrhea. Get help right away if:  Your blood glucose is lower than 54 mg/dL (3 mmol/L).  You become confused or you have trouble thinking clearly.  You have difficulty breathing.  You have moderate or large ketone levels in your urine. Summary  Monitoring your blood sugar (glucose) is an important part of managing your diabetes (diabetes mellitus).  Blood glucose monitoring involves checking your blood glucose as often as directed and keeping a record (log) of your results over time.  Your health care provider will set individualized treatment goals for you. Your goals will be based on your age, other medical conditions you have, and how you respond to diabetes treatment.  Every time you check your blood glucose, write down your result. Also write  down any notes about things that may be affecting your blood glucose, such as your diet and exercise for the day. This information is not intended to replace advice given to you by your health care provider. Make sure you discuss any questions you have with your health care provider. Document Released: 02/14/2003 Document Revised: 12/05/2017 Document Reviewed: 07/24/2015 Elsevier Patient Education  2020 [employer].

## 2018-12-16 NOTE — Assessment & Plan Note (Addendum)
>  40 minutes spent in face to face time with patient, >50% spent in counselling or coordination of care discussing adenocarcinoma of head of pancreas, secondary diabetes, liver function.  She feels good- she is seeing oncology this week and surgery next week.  Has not lost weight- we discussed glucerna shakes.  She is trying to stay optimistic.

## 2018-12-17 ENCOUNTER — Telehealth: Payer: Self-pay

## 2018-12-17 NOTE — Telephone Encounter (Signed)
Spoke with [name]. She called to discuss possibility of daughter coming in with her to initial consult with Dr. Lorenso Courier. I discussed that [place] is relaxing visitor restriction to one patient at clinic visits when appropriate. I let her know that she can bring one of her daughters with her to appointment on 10/23. Advised that they will both be screened for Covid-19 upon entrance to [facility]. She voiced much appreciation.

## 2018-12-17 NOTE — Anesthesia Postprocedure Evaluation (Addendum)
Anesthesia Post Note  Patient: IDA MILBRATH  Procedure(s) Performed: UPPER ENDOSCOPIC ULTRASOUND (EUS) RADIAL (N/A ) FINE NEEDLE ASPIRATION (FNA) LINEAR (N/A ) ESOPHAGOGASTRODUODENOSCOPY (EGD) WITH PROPOFOL (N/A )     Patient location during evaluation: Endoscopy Anesthesia Type: MAC Level of consciousness: awake and patient cooperative Pain management: pain level controlled Vital Signs Assessment: post-procedure vital signs reviewed and stable Respiratory status: spontaneous breathing, nonlabored ventilation, respiratory function stable and patient connected to nasal cannula oxygen Cardiovascular status: stable and blood pressure returned to baseline Postop Assessment: no apparent nausea or vomiting Anesthetic complications: no    Last Vitals:  Vitals:   12/10/18 0920 12/10/18 0930  BP: (!) 131/44 (!) 127/47  Pulse: (!) 47 (!) 49  Resp: 15 15  Temp:    SpO2: 96% 95%    Last Pain:  Vitals:   12/10/18 0930  TempSrc:   PainSc: 0-No pain                 Diella Gillingham

## 2018-12-18 ENCOUNTER — Encounter

## 2018-12-18 ENCOUNTER — Inpatient Hospital Stay: Payer: Medicare Other | Attending: Hematology and Oncology | Admitting: Hematology and Oncology

## 2018-12-18 ENCOUNTER — Ambulatory Visit (INDEPENDENT_AMBULATORY_CARE_PROVIDER_SITE_OTHER): Payer: Medicare Other | Admitting: Cardiovascular Disease

## 2018-12-18 ENCOUNTER — Encounter: Payer: Self-pay | Admitting: Cardiovascular Disease

## 2018-12-18 ENCOUNTER — Other Ambulatory Visit: Payer: Self-pay

## 2018-12-18 VITALS — BP 142/7 | HR 67 | Ht <= 58 in | Wt 130.0 lb

## 2018-12-18 VITALS — BP 148/56 | HR 62 | Temp 98.3°F | Resp 17 | Ht <= 58 in | Wt 129.7 lb

## 2018-12-18 DIAGNOSIS — Z853 Personal history of malignant neoplasm of breast: Secondary | ICD-10-CM | POA: Insufficient documentation

## 2018-12-18 DIAGNOSIS — K219 Gastro-esophageal reflux disease without esophagitis: Secondary | ICD-10-CM | POA: Diagnosis not present

## 2018-12-18 DIAGNOSIS — I25810 Atherosclerosis of coronary artery bypass graft(s) without angina pectoris: Secondary | ICD-10-CM | POA: Diagnosis not present

## 2018-12-18 DIAGNOSIS — K589 Irritable bowel syndrome without diarrhea: Secondary | ICD-10-CM | POA: Insufficient documentation

## 2018-12-18 DIAGNOSIS — R109 Unspecified abdominal pain: Secondary | ICD-10-CM | POA: Diagnosis not present

## 2018-12-18 DIAGNOSIS — Z951 Presence of aortocoronary bypass graft: Secondary | ICD-10-CM

## 2018-12-18 DIAGNOSIS — Z803 Family history of malignant neoplasm of breast: Secondary | ICD-10-CM

## 2018-12-18 DIAGNOSIS — Z823 Family history of stroke: Secondary | ICD-10-CM | POA: Insufficient documentation

## 2018-12-18 DIAGNOSIS — G4733 Obstructive sleep apnea (adult) (pediatric): Secondary | ICD-10-CM | POA: Insufficient documentation

## 2018-12-18 DIAGNOSIS — Z880 Allergy status to penicillin: Secondary | ICD-10-CM | POA: Insufficient documentation

## 2018-12-18 DIAGNOSIS — C250 Malignant neoplasm of head of pancreas: Secondary | ICD-10-CM | POA: Diagnosis not present

## 2018-12-18 DIAGNOSIS — K5730 Diverticulosis of large intestine without perforation or abscess without bleeding: Secondary | ICD-10-CM | POA: Diagnosis not present

## 2018-12-18 DIAGNOSIS — Z170 Estrogen receptor positive status [ER+]: Secondary | ICD-10-CM | POA: Diagnosis not present

## 2018-12-18 DIAGNOSIS — R110 Nausea: Secondary | ICD-10-CM | POA: Diagnosis not present

## 2018-12-18 DIAGNOSIS — Z8719 Personal history of other diseases of the digestive system: Secondary | ICD-10-CM | POA: Diagnosis not present

## 2018-12-18 DIAGNOSIS — R220 Localized swelling, mass and lump, head: Secondary | ICD-10-CM

## 2018-12-18 DIAGNOSIS — G893 Neoplasm related pain (acute) (chronic): Secondary | ICD-10-CM | POA: Diagnosis not present

## 2018-12-18 DIAGNOSIS — Z79899 Other long term (current) drug therapy: Secondary | ICD-10-CM | POA: Insufficient documentation

## 2018-12-18 DIAGNOSIS — Z881 Allergy status to other antibiotic agents status: Secondary | ICD-10-CM | POA: Diagnosis not present

## 2018-12-18 DIAGNOSIS — Z8249 Family history of ischemic heart disease and other diseases of the circulatory system: Secondary | ICD-10-CM | POA: Diagnosis not present

## 2018-12-18 DIAGNOSIS — E139 Other specified diabetes mellitus without complications: Secondary | ICD-10-CM

## 2018-12-18 DIAGNOSIS — C259 Malignant neoplasm of pancreas, unspecified: Secondary | ICD-10-CM

## 2018-12-18 DIAGNOSIS — I10 Essential (primary) hypertension: Secondary | ICD-10-CM | POA: Insufficient documentation

## 2018-12-18 DIAGNOSIS — I2510 Atherosclerotic heart disease of native coronary artery without angina pectoris: Secondary | ICD-10-CM | POA: Diagnosis not present

## 2018-12-18 DIAGNOSIS — I700 Atherosclerosis of aorta: Secondary | ICD-10-CM | POA: Diagnosis not present

## 2018-12-18 DIAGNOSIS — Z833 Family history of diabetes mellitus: Secondary | ICD-10-CM

## 2018-12-18 DIAGNOSIS — Z8042 Family history of malignant neoplasm of prostate: Secondary | ICD-10-CM | POA: Insufficient documentation

## 2018-12-18 DIAGNOSIS — K831 Obstruction of bile duct: Secondary | ICD-10-CM | POA: Diagnosis not present

## 2018-12-18 DIAGNOSIS — Z885 Allergy status to narcotic agent status: Secondary | ICD-10-CM

## 2018-12-18 DIAGNOSIS — Z808 Family history of malignant neoplasm of other organs or systems: Secondary | ICD-10-CM | POA: Insufficient documentation

## 2018-12-18 DIAGNOSIS — K828 Other specified diseases of gallbladder: Secondary | ICD-10-CM | POA: Insufficient documentation

## 2018-12-18 MED ORDER — NITROGLYCERIN 0.4 MG SL SUBL: 0.4000 mg | SUBLINGUAL_TABLET | SUBLINGUAL | 2 refills | Status: DC | PRN

## 2018-12-18 MED ORDER — AMLODIPINE BESYLATE 5 MG PO TABS: 5.0000 mg | ORAL_TABLET | Freq: Every day | ORAL | 3 refills | Status: DC

## 2018-12-18 MED FILL — AMLODIPINE BESYLATE 5 MG TA: 5 | 90 days supply | Qty: 90 | Fill #0

## 2018-12-18 MED FILL — NITROGLYCERIN 0.4 MG TAB SL: 0.4 | 10 days supply | Qty: 25 | Fill #0

## 2018-12-18 NOTE — Progress Notes (Signed)
  Oncology Nurse Navigator Documentation   Met briefly with Mrs. Reznick, who is familiar to me, and provided educational booklet: An Overview Of Pancreatic Cancer: A Guide To Understanding A Complex Disease, published by Pancreatic [employer]. No barriers to treatment identified.Patient has my direct contact information for questions and concerns. I will follow as needed.       Arna Snipe, MS Ed.S, RN    Gastrointestinal Oncology Nurse [place] at [place]   Phone: [phone]

## 2018-12-18 NOTE — Progress Notes (Signed)
Patient scheduled to see Dr. Barry Dienes for surgical consult on 12/23/18.

## 2018-12-18 NOTE — Patient Instructions (Signed)
Medication Instructions:   *If you need a refill on your cardiac medications before your next appointment, please call your pharmacy*  Lab Work:  If you have labs (blood work) drawn today and your tests are completely normal, you will receive your results only by: [name] MyChart Message (if you have MyChart) OR . A paper copy in the mail If you have any lab test that is abnormal or we need to change your treatment, we will call you to review the results.  Testing/Procedures: None ordered today.  Follow-Up: At [facility], you and your health needs are our priority.  As part of our continuing mission to provide you with exceptional heart care, we have created designated Provider Care Teams.  These Care Teams include your primary Cardiologist (physician) and Advanced Practice Providers (APPs -  Physician Assistants and Nurse Practitioners) who all work together to provide you with the care you need, when you need it.  Your next appointment:   3 months  The format for your next appointment:   In Person  Provider:   You may see Jenkins Rouge, MD or one of the following Advanced Practice Providers on your designated Care Team:    Truitt Merle, NP  Cecilie Kicks, NP  Kathyrn Drown, NP

## 2018-12-21 ENCOUNTER — Telehealth: Payer: Self-pay | Admitting: Hematology and Oncology

## 2018-12-21 NOTE — Telephone Encounter (Signed)
Scheduled appt per 10/23 los.  Spoke with pt and she is aware of the appt date and time.

## 2018-12-22 ENCOUNTER — Ambulatory Visit (INDEPENDENT_AMBULATORY_CARE_PROVIDER_SITE_OTHER): Payer: Medicare Other | Admitting: Gastroenterology

## 2018-12-22 ENCOUNTER — Encounter: Payer: Self-pay | Admitting: Gastroenterology

## 2018-12-22 VITALS — BP 128/60 | HR 57 | Temp 97.4°F | Ht <= 58 in | Wt 129.0 lb

## 2018-12-22 DIAGNOSIS — I25810 Atherosclerosis of coronary artery bypass graft(s) without angina pectoris: Secondary | ICD-10-CM | POA: Diagnosis not present

## 2018-12-22 DIAGNOSIS — K831 Obstruction of bile duct: Secondary | ICD-10-CM | POA: Diagnosis not present

## 2018-12-22 DIAGNOSIS — C250 Malignant neoplasm of head of pancreas: Secondary | ICD-10-CM

## 2018-12-22 DIAGNOSIS — Z9889 Other specified postprocedural states: Secondary | ICD-10-CM

## 2018-12-22 NOTE — Patient Instructions (Signed)
Due to recent COVID-19 restrictions implemented by our local and state authorities and in an effort to keep both patients and staff as safe as possible, our hospital system now requires COVID-19 testing prior to any scheduled hospital procedure. Please go to our [facility] location drive thru testing site ([address] [place], [place] [address]) on 02/11/19 at 10:00am. There will be multiple testing areas, the first checkpoint being for pre-procedure/surgery testing. Get into the right (yellow) lane that leads to the PAT testing team. You will not be billed at the time of testing but may receive a bill later depending on your insurance. The approximate cost of the test is $100. You must agree to quarantine from the time of your testing until the procedure date on 02/15/19 . This should include staying at home with ONLY the people you live with. Avoid take-out, grocery store shopping or leaving the house for any non-emergent reason. Failure to have your COVID-19 test done on the date and time you have been scheduled will result in cancellation of procedure. Please call our office at [phone] if you have any questions.   You have been scheduled for an ERCP. Please follow written instructions given to you at your visit today. If you use inhalers (even only as needed), please bring them with you on the day of your procedure.  Thank you for choosing me and [facility].  Pricilla Riffle. Dagoberto Ligas., MD., [facility]

## 2018-12-22 NOTE — Progress Notes (Signed)
    History of Present Illness: This is an 83 year old female recently diagnosed with adenocarcinoma of the head of the pancreas.  She underwent CT AP, ERCP with plastic biliary stent placement, EUS with FNA. She was evaluated by Dr. Lorenso Courier on Friday.  She is considering her treatment options. She has an appointment with Dr. Barry Dienes tomorrow.  She notes mild epigastric pain, currently relieved by Tylenol. She has chronic back pain that is unchanged. Otherwise she feel quite well. No other GI complaints. LFTs have substantially improved with LFTs on 10/21 showing t bili=1.4, alk phos=155, ALT=36.  Current Medications, Allergies, Past Medical History, Past Surgical History, Family History and Social History were reviewed in [employer] record.   Physical Exam: General: Well developed, well nourished, no acute distress Head: Normocephalic and atraumatic Eyes:  sclerae anicteric, EOMI Ears: Normal auditory acuity Mouth: No deformity or lesions Lungs: Clear throughout to auscultation Heart: Regular rate and rhythm; no murmurs, rubs or bruits Abdomen: Soft, non tender and non distended. No masses, hepatosplenomegaly or hernias noted. Normal Bowel sounds Rectal: Not done Musculoskeletal: Symmetrical with no gross deformities  Pulses:  Normal pulses noted Extremities: No clubbing, cyanosis, edema or deformities noted Neurological: Alert oriented x 4, grossly nonfocal Psychological:  Alert and cooperative. Normal mood and affect   Assessment and Recommendations:  1.  Adenocarcinoma of the head of the pancreas stage 1A status post biliary stent placement for biliary obstruction. LFTs substantially improved. Mild epigastric pain persists and is reduced with Tylenol. Continue Tylenol tid prn and Tramadol 1/2 to 1 po tid prn if Tylenol is not effective. If decision is made not to pursue surgical management will plan to replace her plastic biliary stent with a SEMS in December. Pt  is advised of signs and symptoms of stent occlusion and to contact us if they occur. Dr. Barry Dienes appointment tomorrow. Dr. Lorenso Courier appointment on 11/6. REV with me in 6 weeks.

## 2018-12-23 DIAGNOSIS — C250 Malignant neoplasm of head of pancreas: Secondary | ICD-10-CM | POA: Diagnosis not present

## 2018-12-25 ENCOUNTER — Ambulatory Visit: Payer: Self-pay | Admitting: Family Medicine

## 2018-12-25 ENCOUNTER — Ambulatory Visit (INDEPENDENT_AMBULATORY_CARE_PROVIDER_SITE_OTHER): Payer: Medicare Other | Admitting: Nurse Practitioner

## 2018-12-25 ENCOUNTER — Telehealth: Payer: Self-pay

## 2018-12-25 ENCOUNTER — Encounter: Payer: Self-pay | Admitting: Nurse Practitioner

## 2018-12-25 VITALS — BP 140/64 | HR 52 | Temp 96.8°F | Ht <= 58 in | Wt 130.0 lb

## 2018-12-25 DIAGNOSIS — R5383 Other fatigue: Secondary | ICD-10-CM

## 2018-12-25 DIAGNOSIS — R001 Bradycardia, unspecified: Secondary | ICD-10-CM | POA: Diagnosis not present

## 2018-12-25 DIAGNOSIS — I25701 Atherosclerosis of coronary artery bypass graft(s), unspecified, with angina pectoris with documented spasm: Secondary | ICD-10-CM | POA: Diagnosis not present

## 2018-12-25 LAB — TROPONIN I (HIGH SENSITIVITY): High Sens Troponin I: 4 ng/L (ref 2–17)

## 2018-12-25 NOTE — Telephone Encounter (Signed)
fyi

## 2018-12-25 NOTE — Patient Instructions (Addendum)
Hold today's evening dose of metoprolol. If HR <55, continue to hold metoprolol and contact cardiology on Monday. Change positions slowly.  Go to ED if symptoms worsen. Negative troponin  Bradycardia, Adult Bradycardia is a slower-than-normal heartbeat. A normal resting heart rate for an adult ranges from 60 to 100 beats per minute. With bradycardia, the resting heart rate is less than 60 beats per minute. Bradycardia can prevent enough oxygen from reaching certain areas of your body when you are active. It can be serious if it keeps enough oxygen from reaching your brain and other parts of your body. Bradycardia is not a problem for everyone. For some healthy adults, a slow resting heart rate is normal. What are the causes? This condition may be caused by:  A problem with the heart, including: ? A problem with the heart's electrical system, such as a heart block. With a heart block, electrical signals between the chambers of the heart are partially or completely blocked, so they are not able to work as they should. ? A problem with the heart's natural pacemaker (sinus node). ? Heart disease. ? A heart attack. ? Heart damage. ? Lyme disease. ? A heart infection. ? A heart condition that is present at birth (congenital heart defect).  Certain medicines that treat heart conditions.  Certain conditions, such as hypothyroidism and obstructive sleep apnea.  Problems with the balance of chemicals and other substances, like potassium, in the blood.  Trauma.  Radiation therapy. What increases the risk? You are more likely to develop this condition if you:  Are age 51 or older.  Have high blood pressure (hypertension), high cholesterol (hyperlipidemia), or diabetes.  Drink heavily, use tobacco or nicotine products, or use drugs. What are the signs or symptoms? Symptoms of this condition include:  Light-headedness.  Feeling faint or fainting.  Fatigue and weakness.  Trouble  with activity or exercise.  Shortness of breath.  Chest pain (angina).  Drowsiness.  Confusion.  Dizziness. How is this diagnosed? This condition may be diagnosed based on:  Your symptoms.  Your medical history.  A physical exam. During the exam, your health care provider will listen to your heartbeat and check your pulse. To confirm the diagnosis, your health care provider may order tests, such as:  Blood tests.  An electrocardiogram (ECG). This test records the heart's electrical activity. The test can show how fast your heart is beating and whether the heartbeat is steady.  A test in which you wear a portable device (event recorder or Holter monitor) to record your heart's electrical activity while you go about your day.  Anexercise test. How is this treated? Treatment for this condition depends on the cause of the condition and how severe your symptoms are. Treatment may involve:  Treatment of the underlying condition.  Changing your medicines or how much medicine you take.  Having a small, battery-operated device called a pacemaker implanted under the skin. When bradycardia occurs, this device can be used to increase your heart rate and help your heart beat in a regular rhythm. Follow these instructions at home: Lifestyle   Manage any health conditions that contribute to bradycardia as told by your health care provider.  Follow a heart-healthy diet. A nutrition specialist (dietitian) can help educate you about healthy food options and changes.  Follow an exercise program that is approved by your health care provider.  Maintain a healthy weight.  Try to reduce or manage your stress, such as with yoga or meditation. If you  need help reducing stress, ask your health care provider.  Do not use any products that contain nicotine or tobacco, such as cigarettes, e-cigarettes, and chewing tobacco. If you need help quitting, ask your health care provider.  Do not use  illegal drugs.  Limit alcohol intake to no more than 1 drink a day for nonpregnant women and 2 drinks a day for men. Be aware of how much alcohol is in your drink. In the [place] one drink equals one 12 oz bottle of beer (355 mL), one 5 oz glass of wine (148 mL), or one 1 oz glass of hard liquor (44 mL). General instructions  Take over-the-counter and prescription medicines only as told by your health care provider.  Keep all follow-up visits as told by your health care provider. This is important. How is this prevented? In some cases, bradycardia may be prevented by:  Treating underlying medical problems.  Stopping behaviors or medicines that can trigger the condition. Contact a health care provider if you:  Feel light-headed or dizzy.  Almost faint.  Feel weak or are easily fatigued during physical activity.  Experience confusion or have memory problems. Get help right away if:  You faint.  You have: ? An irregular heartbeat (palpitations). ? Chest pain. ? Trouble breathing. Summary  Bradycardia is a slower-than-normal heartbeat. With bradycardia, the resting heart rate is less than 60 beats per minute.  Treatment for this condition depends on the cause.  Manage any health conditions that contribute to bradycardia as told by your health care provider.  Do not use any products that contain nicotine or tobacco, such as cigarettes, e-cigarettes, and chewing tobacco, and limit alcohol intake.  Keep all follow-up visits as told by your health care provider. This is important. This information is not intended to replace advice given to you by your health care provider. Make sure you discuss any questions you have with your health care provider. Document Released: 11/03/2001 Document Revised: 08/25/2017 Document Reviewed: 07/23/2017 Elsevier Patient Education  2020 [employer].

## 2018-12-25 NOTE — Telephone Encounter (Signed)

## 2018-12-25 NOTE — Progress Notes (Signed)
Subjective:  Patient ID: [name], female    DOB: 18-Dec-1935  Age: 83 y.o. MRN: [id number]  CC: Fatigue (pt is c/o fatigue and little nausea,low HR/1 day/)  HPI [name] present with low heart rate and fatigue since this morning, denies any chest pain or diaphoresis or dizziness or palpitation or numbness/tingling. She took AM and noon dose of metoprolol today.  Reviewed past Medical, Social and Family history today.  Outpatient Medications Prior to Visit  Medication Sig Dispense Refill  . acetaminophen (TYLENOL) 650 MG CR tablet Take 650 mg by mouth every 8 (eight) hours as needed for pain.    [name] amLODipine (NORVASC) 5 MG tablet Take 1 tablet (5 mg total) by mouth daily. 90 tablet 3  . aspirin EC 81 MG tablet Take 81 mg by mouth daily.    [name] CALCIUM PO Take 1 tablet by mouth daily with breakfast.     . EPINEPHrine 0.3 mg/0.3 mL IJ SOAJ injection Inject 0.3 mg into the muscle as needed (as directed- FOR A SEVERE REACTION).     [name] fexofenadine (ALLEGRA) 180 MG tablet Take 180 mg by mouth daily as needed (for seasonal allergies).     . fluticasone (FLONASE) 50 MCG/ACT nasal spray Place 1-2 sprays into both nostrils daily as needed for allergies or rhinitis.    . Glucosamine-Chondroit-Vit C-Mn (GLUCOSAMINE CHONDR 1500 COMPLX PO) Take 1 tablet by mouth 3 (three) times daily.      . hydrALAZINE (APRESOLINE) 50 MG tablet Take 0.5 tablets (25 mg total) by mouth 3 (three) times daily. (Patient taking differently: Take 25 mg by mouth 2 (two) times daily. ) 135 tablet 1  . latanoprost (XALATAN) 0.005 % ophthalmic solution Place 1 drop into both eyes at bedtime.     [name] losartan (COZAAR) 100 MG tablet TAKE 1/2 TABLET BY MOUTH THREE TIMES A DAY (Patient taking differently: Take 50 mg by mouth 2 (two) times daily. ) 135 tablet 0  . metoprolol tartrate (LOPRESSOR) 50 MG tablet TAKE 1 TABLET BY MOUTH THREE TIMES A DAY (Patient taking differently: Take 50 mg by mouth 3 (three) times daily. ) 270  tablet 3  . nitroGLYCERIN (NITROSTAT) 0.4 MG SL tablet Place 1 tablet (0.4 mg total) under the tongue every 5 (five) minutes as needed for chest pain. 25 tablet 2  . pantoprazole (PROTONIX) 40 MG tablet Take 1 tablet (40 mg total) by mouth 2 (two) times daily. Take 30-60 minutes before breakfast 60 tablet 11  . Psyllium (METAMUCIL FIBER PO) Take by mouth See admin instructions. Mix one rounded teaspoonful into 8 oz water and drink once a day    . rosuvastatin (CRESTOR) 5 MG tablet TAKE 1 TABLET BY MOUTH 3 TIMES A WEEK. (TAKE ON MONDAY, WEDNESDAY, AND FRIDAY) (Patient taking differently: Take 5 mg by mouth every Monday, Wednesday, and Friday. ) 30 tablet 5  . sodium chloride (OCEAN) 0.65 % SOLN nasal spray Place 1 spray into both nostrils as needed for congestion.    . sucralfate (CARAFATE) 1 g tablet TAKE 1 TABLET BY MOUTH BY MOUTH TWICE DAILY(ONE BEFORE DINNER) (Patient taking differently: Take 1 g by mouth 2 (two) times daily before a meal. ) 60 tablet 1  . traMADol (ULTRAM) 50 MG tablet Take 1/2 to 1 tablet by mouth three times daily as needed 30 tablet 1  . vitamin B-12 (CYANOCOBALAMIN) 1000 MCG tablet Take 1,000 mcg by mouth daily.      [name] VITAMIN D PO Take 2 tablets  by mouth daily with breakfast.     . albuterol (VENTOLIN HFA) 108 (90 Base) MCG/ACT inhaler Inhale 2 puffs into the lungs every 6 (six) hours as needed for wheezing or shortness of breath.    . blood glucose meter kit and supplies KIT Dispense based on patient and insurance preference.Check fasting blood sugars daily (FOR ICD-9 250.00, 250.01). (Patient not taking: Reported on 12/25/2018) 1 each 0   No facility-administered medications prior to visit.     ROS See HPI  Objective:  BP 140/64   Pulse (!) 52   Temp (!) 96.8 F (36 C) (Tympanic)   Ht 4' 10" (1.473 m)   Wt 130 lb (59 kg)   LMP 11/26/1990   SpO2 96%   BMI 27.17 kg/m   BP Readings from Last 3 Encounters:  12/25/18 140/64  12/22/18 128/60  12/18/18 (!)  148/56    Wt Readings from Last 3 Encounters:  12/25/18 130 lb (59 kg)  12/22/18 129 lb (58.5 kg)  12/18/18 129 lb 11.2 oz (58.8 kg)   ECG: S.Bradycardia, no ST segment of T wave abnormality.  Physical Exam Constitutional:      Appearance: She is obese.  HENT:     Right Ear: Tympanic membrane, ear canal and external ear normal.     Left Ear: Tympanic membrane, ear canal and external ear normal.  Cardiovascular:     Rate and Rhythm: Normal rate and regular rhythm.     Pulses: Normal pulses.     Heart sounds: Normal heart sounds.  Pulmonary:     Effort: Pulmonary effort is normal.     Breath sounds: Normal breath sounds.  Musculoskeletal:     Right lower leg: No edema.     Left lower leg: No edema.  Neurological:     Mental Status: She is alert and oriented to person, place, and time.  Psychiatric:        Mood and Affect: Mood normal.        Behavior: Behavior normal.     Lab Results  Component Value Date   WBC 8.9 12/07/2018   HGB 11.0 (L) 12/07/2018   HCT 33.1 (L) 12/07/2018   PLT 495.0 (H) 12/07/2018   GLUCOSE 136 (H) 12/16/2018   CHOL 222 (H) 04/13/2018   TRIG 98.0 04/13/2018   HDL 63.20 04/13/2018   LDLDIRECT 140.0 06/26/2016   LDLCALC 140 (H) 04/13/2018   ALT 36 (H) 12/16/2018   AST 19 12/16/2018   NA 134 (L) 12/16/2018   K 4.0 12/16/2018   CL 98 12/16/2018   CREATININE 0.63 12/16/2018   BUN 11 12/16/2018   CO2 29 12/16/2018   TSH 2.10 12/07/2018   INR 1.35 01/19/2010   HGBA1C 7.6 (H) 12/16/2018   MICROALBUR <0.7 12/16/2018    Assessment & Plan:   [name] was seen today for fatigue.  Diagnoses and all orders for this visit:  Bradycardia -     EKG 12-Lead -     Cancel: Troponin I -     Troponin I (High Sensitivity)  Fatigue, unspecified type -     Cancel: Troponin I -     Troponin I (High Sensitivity)  Atherosclerosis of coronary artery bypass graft of native heart with angina pectoris with documented spasm (HCC) -     Cancel: Troponin I  -     Troponin I (High Sensitivity)   I am having [name]. [name] maintain her fexofenadine, Glucosamine-Chondroit-Vit C-Mn (GLUCOSAMINE CHONDR 1500 COMPLX PO), vitamin B-12, latanoprost,  aspirin EC, albuterol, Psyllium (METAMUCIL FIBER PO), metoprolol tartrate, CALCIUM PO, VITAMIN D PO, EPINEPHrine, hydrALAZINE, rosuvastatin, pantoprazole, losartan, sucralfate, sodium chloride, fluticasone, acetaminophen, traMADol, blood glucose meter kit and supplies, nitroGLYCERIN, and amLODipine.  No orders of the defined types were placed in this encounter.   Problem List Items Addressed This Visit      Cardiovascular and Mediastinum   Atherosclerosis of coronary artery bypass graft of native heart with angina pectoris with documented spasm (HCC)   Relevant Orders   Troponin I (High Sensitivity) (Completed)    Other Visit Diagnoses    Bradycardia    -  Primary   Relevant Orders   EKG 12-Lead (Completed)   Troponin I (High Sensitivity) (Completed)   Fatigue, unspecified type       Relevant Orders   Troponin I (High Sensitivity) (Completed)      Follow-up: Return if symptoms worsen or fail to improve.  Wilfred Lacy, NP

## 2018-12-25 NOTE — Telephone Encounter (Signed)
Patient called stating that this morning she had a spell where she felt very weak.  She checked her BS 113  She ate and rechecked 184.  She checked her BP 123/70 HR60, and 122/64 HR56.  She states she take metoprolol in the am. She states that she waked a little sat down and slept. She has no other symptoms.  She denies chest pain.  She has a pancreatic tumor which she is being evaluated for treatment. Care advice read to patient. She verbalized understanding. Call was transferred to office for scheduling.  Reason for Disposition . [1] MODERATE weakness (i.e., interferes with work, school, normal activities) AND [2] persists > 3 days  Answer Assessment - Initial Assessment Questions 1. DESCRIPTION: "Describe how you are feeling."     weak 2. SEVERITY: "How bad is it?"  "Can you stand and walk?"   - MILD - Feels weak or tired, but does not interfere with work, school or normal activities   - [place] to stand and walk; weakness interferes with work, school, or normal activities   - SEVERE - Unable to stand or walk     moderate 3. ONSET:  "When did the weakness begin?"     Today in the am 4. CAUSE: "What do you think is causing the weakness?"     Unsure  5. MEDICINES: "Have you recently started a new medicine or had a change in the amount of a medicine?"    no 6. OTHER SYMPTOMS: "Do you have any other symptoms?" (e.g., chest pain, fever, cough, SOB, vomiting, diarrhea, bleeding, other areas of pain)     no 7. PREGNANCY: "Is there any chance you are pregnant?" "When was your last menstrual period?"    N/A  Protocols used: WEAKNESS (GENERALIZED) AND FATIGUE-A-AH

## 2018-12-28 ENCOUNTER — Encounter: Payer: Self-pay | Admitting: Nurse Practitioner

## 2018-12-30 ENCOUNTER — Other Ambulatory Visit: Payer: Self-pay

## 2018-12-30 ENCOUNTER — Ambulatory Visit
Admission: RE | Admit: 2018-12-30 | Discharge: 2018-12-30 | Disposition: A | Discharge status: Home / Self Care | Payer: Medicare Other | Source: Ambulatory Visit | Attending: Radiation Oncology | Admitting: Radiation Oncology

## 2018-12-30 ENCOUNTER — Encounter: Payer: Self-pay | Admitting: Radiation Oncology

## 2018-12-30 VITALS — BP 182/72 | HR 63 | Temp 98.9°F | Resp 18 | Ht <= 58 in | Wt 128.8 lb

## 2018-12-30 DIAGNOSIS — Z8719 Personal history of other diseases of the digestive system: Secondary | ICD-10-CM | POA: Insufficient documentation

## 2018-12-30 DIAGNOSIS — M797 Fibromyalgia: Secondary | ICD-10-CM | POA: Insufficient documentation

## 2018-12-30 DIAGNOSIS — C250 Malignant neoplasm of head of pancreas: Secondary | ICD-10-CM

## 2018-12-30 DIAGNOSIS — E119 Type 2 diabetes mellitus without complications: Secondary | ICD-10-CM | POA: Insufficient documentation

## 2018-12-30 DIAGNOSIS — I2510 Atherosclerotic heart disease of native coronary artery without angina pectoris: Secondary | ICD-10-CM | POA: Diagnosis not present

## 2018-12-30 DIAGNOSIS — K219 Gastro-esophageal reflux disease without esophagitis: Secondary | ICD-10-CM | POA: Insufficient documentation

## 2018-12-30 DIAGNOSIS — E7800 Pure hypercholesterolemia, unspecified: Secondary | ICD-10-CM | POA: Diagnosis not present

## 2018-12-30 DIAGNOSIS — J45909 Unspecified asthma, uncomplicated: Secondary | ICD-10-CM | POA: Diagnosis not present

## 2018-12-30 DIAGNOSIS — Z79899 Other long term (current) drug therapy: Secondary | ICD-10-CM | POA: Insufficient documentation

## 2018-12-30 DIAGNOSIS — Z853 Personal history of malignant neoplasm of breast: Secondary | ICD-10-CM | POA: Diagnosis not present

## 2018-12-30 DIAGNOSIS — Z7982 Long term (current) use of aspirin: Secondary | ICD-10-CM | POA: Diagnosis not present

## 2018-12-30 DIAGNOSIS — K589 Irritable bowel syndrome without diarrhea: Secondary | ICD-10-CM | POA: Insufficient documentation

## 2018-12-30 DIAGNOSIS — Z923 Personal history of irradiation: Secondary | ICD-10-CM | POA: Diagnosis not present

## 2018-12-30 HISTORY — DX: Malignant neoplasm of pancreas, unspecified: C25.9

## 2018-12-30 NOTE — Progress Notes (Signed)
GI Location of Tumor / Histology: adenocarcinoma of the pancreatic head  [name] presented with symptoms of: pruritis and jaundice  Biopsies revealed: 12/10/18: CYTOLOGY - NON PAP  CASE: [id number]  PATIENT: [name]  Non-Gynecological Cytology Report   Clinical History: 1.9 x 2.5 cm pancreatic mass  Specimen Submitted: A. PANCREAS, HEAD, FINE NEEDLE ASPIRATION:   DIAGNOSIS:  - Malignant cells consistent with adenocarcinoma   Past/Anticipated interventions by surgeon, if any: ERCP planned by Dr. Fuller Plan 02/15/19. None per Dr. Barry Dienes at this time.  Past/Anticipated interventions by medical oncology, if any: Initial consult with Dr. Lorenso Courier 01/01/19  Weight changes, if any: denies significant weight loss "maybe a few pounds"  Bowel/Bladder complaints, if any: no  Nausea / Vomiting, if [id number] some   Pain issues, if any: 6/10  Any blood per rectum:   no  SAFETY ISSUES: Prior radiation? TREATMENT DATES:  March 19, 2011, through May 06, 2011.  SITE/DOSE:  Left breast/5040 cGy in 28 fractions (180 cGy per fraction). The site of presentation in the lower outer quadrant of the left breast  was boosted to a cumulative dose of 6440 cGy.  Pacemaker/ICD? no  Possible current pregnancy? No  Is the patient on methotrexate? No  Current Complaints/Details: Patient in for consult for pancreatic cancer. Found by itching and yellow enzymes high. MRI found mass EGD with stent placement which relieved the itching and the jaundice.. BP (!) 182/72 (BP Location: Right Arm, Patient Position: Sitting, Cuff Size: Normal)   Pulse 63   Temp 98.9 F (37.2 C) (Temporal)   Resp 18   Ht 4\' 10"  (1.473 m)   Wt 128 lb 12.8 oz (58.4 kg)   LMP 11/26/1990   SpO2 98%   BMI 26.92 kg/m

## 2018-12-30 NOTE — Patient Instructions (Signed)
Coronavirus (COVID-19) Are you at risk?  Are you at risk for the Coronavirus (COVID-19)?  To be considered HIGH RISK for Coronavirus (COVID-19), you have to meet the following criteria:  . Traveled to [place], [place], [place], [place] or [place]; or in the [place] to [place], [place], [place], or [place]; and have fever, cough, and shortness of breath within the last 2 weeks of travel OR . Been in close contact with a person diagnosed with COVID-19 within the last 2 weeks and have fever, cough, and shortness of breath . IF YOU DO NOT MEET THESE CRITERIA, YOU ARE CONSIDERED LOW RISK FOR COVID-19.  What to do if you are HIGH RISK for COVID-19?  . If you are having a medical emergency, call 911. . Seek medical care right away. Before you go to a doctor's office, urgent care or emergency department, call ahead and tell them about your recent travel, contact with someone diagnosed with COVID-19, and your symptoms. You should receive instructions from your physician's office regarding next steps of care.  . When you arrive at healthcare provider, tell the healthcare staff immediately you have returned from visiting [place], [place], [place], [place] or [place]; or traveled in the [place] to [place], [place], [place], or [place]; in the last two weeks or you have been in close contact with a person diagnosed with COVID-19 in the last 2 weeks.   . Tell the health care staff about your symptoms: fever, cough and shortness of breath. . After you have been seen by a medical provider, you will be either: o Tested for (COVID-19) and discharged home on quarantine except to seek medical care if symptoms worsen, and asked to  - Stay home and avoid contact with others until you get your results (4-5 days)  - Avoid travel on public transportation if possible (such as bus, train, or airplane) or o Sent to the Emergency Department by EMS for evaluation, COVID-19 testing, and possible  admission depending on your condition and test results.  What to do if you are LOW RISK for COVID-19?  Reduce your risk of any infection by using the same precautions used for avoiding the common cold or flu:  . Wash your hands often with soap and warm water for at least 20 seconds.  If soap and water are not readily available, use an alcohol-based hand sanitizer with at least 60% alcohol.  . If coughing or sneezing, cover your mouth and nose by coughing or sneezing into the elbow areas of your shirt or coat, into a tissue or into your sleeve (not your hands). . Avoid shaking hands with others and consider head nods or verbal greetings only. . Avoid touching your eyes, nose, or mouth with unwashed hands.  . Avoid close contact with people who are sick. . Avoid places or events with large numbers of people in one location, like concerts or sporting events. . Carefully consider travel plans you have or are making. . If you are planning any travel outside or inside the [place], visit the CDC's Travelers' Health webpage for the latest health notices. . If you have some symptoms but not all symptoms, continue to monitor at home and seek medical attention if your symptoms worsen. . If you are having a medical emergency, call 911.   ADDITIONAL HEALTHCARE OPTIONS FOR PATIENTS  [facility] / e-Visit: [email or web address]         [facility]: [phone]  [facility]: [phone]                   [facility]: [phone]   

## 2018-12-30 NOTE — Progress Notes (Signed)
Radiation Oncology         [phone] ________________________________  Initial outpatient Consultation  Name: [name] MRN: [id number]  Date: 12/30/2018  DOB: [birth year]  IW:PYKD, Marciano Sequin, MD  Stark Klein, MD   REFERRING PHYSICIAN: Stark Klein, MD  DIAGNOSIS: The encounter diagnosis was Adenocarcinoma of head of pancreas [facility]).  Adenocarcinoma of the pancreatic head, uT2N1  HISTORY OF PRESENT ILLNESS::[name] is a 83 y.o. female who is accompanied by her daughter. The patient had been complaining of intermittent abdominal/epigastric pain since 05/2018. She was seen and evaluated by her PCP and GI several times and was treated for GERD and diverticulosis.  She began complaining of pruritis of hands on 11/26/2018. Labs ordered. Patient was admitted to hospital from ED on 11/27/2018 for RUQ abdominal pain, hyperbilirubinemia, dilated bile duct, epigastric pain, and LFT elevation. Abdominal ultrasound on 11/27/2018 showed suggestion of a mass at the head of the pancreas as well as dilation of the main pancreatic duct. CT of abdomen/pelvis showed an ill-defined, hypodense mass of the pancreatic head measuring 1.9 x 1.6 x 1.6 cm with gross biliary and pancreatic ductal dilatation. Findings consistent with pancreatic adenocarcinoma.   Chest CT on 11/29/2018 showed no evidence of metastatic disease in the chest.   She wad admitted to hospital on 12/10/2018 for upper endoscopic ultrasound, fine needle aspiration, and esophagogastroduodenoscopy. Biopsy (12/10/2018) of peri-pancreatic lymph node showed no malignant cells. Biopsy (12/10/2018) of pancreatic head showed malignant cells consistent with adenocarcinoma.  Patient has been seen by Dr. Barry Dienes and given the patient's advanced age and other medical issues she is not felt to be a good candidate for curative surgery.  Patient is also been seen by medical oncology and offered palliative chemotherapy.  Patient is now  seen in radiation oncology to explore radiation therapy options and management of what appears to be localized adenocarcinoma of the head of the pancreas.  PREVIOUS RADIATION THERAPY: Yes. 03/19/11 - 05/06/11  Left Breast: 64.4 Gy in 35 fractions  PAST MEDICAL HISTORY:  Past Medical History:  Diagnosis Date  . Asthma   . Atrophic vaginitis   . Breast cancer, Left 12/20/2010   NO BLOOD PRESSURE CHECKS OR STICKS IN LEFT ARM  . CAD (coronary artery disease)    a. 01/19/2010 s/p CABG x 3, lima->lad, vg->diag, vg->om1;  b. 06/2011 :Lexi MV: EF 84%, No ischemia. c. 01/06/14 s/p negative nuclear stress test with EF >70%  . Cervical arthritis   . Colonic polyp 04-27-2009   tubular adenoma  . Diverticulosis of colon (without mention of hemorrhage)   . Fibromyalgia   . Gallstones   . GERD (gastroesophageal reflux disease)   . Glaucoma   . Headache(784.0)    a. frequently assocaited with high BPs  . Hiatal hernia   . Hypercholesterolemia   . Irritable bowel syndrome   . Labile hypertension   . Lymphedema    left arm  . Pancreatic cancer ([place])   . Pinched vertebral nerve   . Secondary diabetes ([place]) 12/03/2018    PAST SURGICAL HISTORY: Past Surgical History:  Procedure Laterality Date  . [place] STUDY N/A 01/08/2016   Procedure: [place] STUDY;  Surgeon: Ladene Artist, MD;  Location: [facility] ENDOSCOPY;  Service: Endoscopy;  Laterality: N/A;  . BILIARY BRUSHING  11/28/2018   Procedure: BILIARY BRUSHING;  Surgeon: Gatha Mayer, MD;  Location[facility] ENDOSCOPY;  Service: Endoscopy;;  . BILIARY STENT PLACEMENT  11/28/2018   Procedure: BILIARY STENT PLACEMENT;  Surgeon: Gatha Mayer, MD;  Location: [facility] ENDOSCOPY;  Service: Endoscopy;;  . BREAST LUMPECTOMY Left 02/06/11  . CATARACT EXTRACTION, BILATERAL     bilateral caaract removal,  . COLONOSCOPY    . CORONARY ANGIOPLASTY WITH STENT PLACEMENT     Stent 2007  . CORONARY ARTERY BYPASS GRAFT    . ERCP N/A 11/28/2018   Procedure:  ENDOSCOPIC RETROGRADE CHOLANGIOPANCREATOGRAPHY (ERCP);  Surgeon: Gatha Mayer, MD;  Location: [facility] ENDOSCOPY;  Service: Endoscopy;  Laterality: N/A;  . ESOPHAGEAL MANOMETRY N/A 01/08/2016   Procedure: ESOPHAGEAL MANOMETRY (EM);  Surgeon: Ladene Artist, MD;  Location: [facility] ENDOSCOPY;  Service: Endoscopy;  Laterality: N/A;  . ESOPHAGOGASTRODUODENOSCOPY    . ESOPHAGOGASTRODUODENOSCOPY (EGD) WITH PROPOFOL N/A 12/10/2018   Procedure: ESOPHAGOGASTRODUODENOSCOPY (EGD) WITH PROPOFOL;  Surgeon: [name], MD;  Location: [facility] ENDOSCOPY;  Service: Endoscopy;  Laterality: N/A;  . EUS N/A 12/10/2018   Procedure: UPPER ENDOSCOPIC ULTRASOUND (EUS) RADIAL;  Surgeon: [name], MD;  Location: [facility] ENDOSCOPY;  Service: Endoscopy;  Laterality: N/A;  . FINE NEEDLE ASPIRATION N/A 12/10/2018   Procedure: FINE NEEDLE ASPIRATION (FNA) LINEAR;  Surgeon: [name], MD;  Location: [facility] ENDOSCOPY;  Service: Endoscopy;  Laterality: N/A;    FAMILY HISTORY:  Family History  Problem Relation Age of Onset  . Stroke Mother   . Stroke Father   . Prostate cancer Father   . Breast cancer Sister   . Diabetes Sister   . Cancer Brother        gland cancer  . Heart disease Brother   . Colon cancer Neg Hx   . Stomach cancer Neg Hx   . Pancreatic cancer Neg Hx   . Kidney disease Neg Hx   . Liver disease Neg Hx     SOCIAL HISTORY:  Social History   Tobacco Use  . Smoking status: Never Smoker  . Smokeless tobacco: Never Used  Substance Use Topics  . Alcohol use: No    Alcohol/week: 0.0 standard drinks  . Drug use: No    ALLERGIES:  Allergies  Allergen Reactions  . Levaquin [Levofloxacin In D5w]     Elevated BP  . Choline Fenofibrate Other (See Comments)     pt states INTOL to Trilipix w/ "thigh burning"  . Hctz [Hydrochlorothiazide]     Causes hyponatremia  . Simvastatin Other (See Comments)     pt states INTOL to STATINS \T_0 refuses to restart  . Adhesive [Tape] Rash  . Bentyl [Dicyclomine  Hcl] Other (See Comments)    "Made me feel weird and drained me"  . Ceclor [Cefaclor] Rash  . Clarithromycin Rash  . Codeine Nausea Only  . Doxycycline Rash  . Hydrocodone     Nightmare after taking cough syrup w/hydrocodone  . Lisinopril Cough    Developed ACE cough...  . Penicillins Itching, Rash and Other (See Comments)    At injection site Did it involve swelling of the face/tongue/throat, SOB, or low BP? No Did it involve sudden or severe rash/hives, skin peeling, or any reaction on the inside of your mouth or nose? No Did you need to seek medical attention at a hospital or doctor's office? No When did it last happen?"It was a long time ago" If all above answers are "NO", may proceed with cephalosporin use.   . Tobramycin-Dexamethasone Rash    MEDICATIONS:  Current Outpatient Medications  Medication Sig Dispense Refill  . acetaminophen (TYLENOL) 650 MG CR tablet Take 650 mg by mouth every 8 (eight)  hours as needed for pain.    [name] albuterol (VENTOLIN HFA) 108 (90 Base) MCG/ACT inhaler Inhale 2 puffs into the lungs every 6 (six) hours as needed for wheezing or shortness of breath.    [name] amLODipine (NORVASC) 5 MG tablet Take 1 tablet (5 mg total) by mouth daily. 90 tablet 3  . aspirin EC 81 MG tablet Take 81 mg by mouth daily.    . blood glucose meter kit and supplies KIT Dispense based on patient and insurance preference.Check fasting blood sugars daily (FOR ICD-9 250.00, 250.01). 1 each 0  . CALCIUM PO Take 1 tablet by mouth daily with breakfast.     . EPINEPHrine 0.3 mg/0.3 mL IJ SOAJ injection Inject 0.3 mg into the muscle as needed (as directed- FOR A SEVERE REACTION).     [name] fexofenadine (ALLEGRA) 180 MG tablet Take 180 mg by mouth daily as needed (for seasonal allergies).     . fluticasone (FLONASE) 50 MCG/ACT nasal spray Place 1-2 sprays into both nostrils daily as needed for allergies or rhinitis.    . Glucosamine-Chondroit-Vit C-Mn (GLUCOSAMINE CHONDR 1500 COMPLX PO) Take 1  tablet by mouth 3 (three) times daily.      . hydrALAZINE (APRESOLINE) 50 MG tablet Take 0.5 tablets (25 mg total) by mouth 3 (three) times daily. (Patient taking differently: Take 25 mg by mouth 2 (two) times daily. ) 135 tablet 1  . latanoprost (XALATAN) 0.005 % ophthalmic solution Place 1 drop into both eyes at bedtime.     [name] losartan (COZAAR) 100 MG tablet TAKE 1/2 TABLET BY MOUTH THREE TIMES A DAY (Patient taking differently: Take 50 mg by mouth 2 (two) times daily. ) 135 tablet 0  . metoprolol tartrate (LOPRESSOR) 50 MG tablet TAKE 1 TABLET BY MOUTH THREE TIMES A DAY (Patient taking differently: Take 50 mg by mouth 3 (three) times daily. ) 270 tablet 3  . nitroGLYCERIN (NITROSTAT) 0.4 MG SL tablet Place 1 tablet (0.4 mg total) under the tongue every 5 (five) minutes as needed for chest pain. 25 tablet 2  . pantoprazole (PROTONIX) 40 MG tablet Take 1 tablet (40 mg total) by mouth 2 (two) times daily. Take 30-60 minutes before breakfast 60 tablet 11  . Psyllium (METAMUCIL FIBER PO) Take by mouth See admin instructions. Mix one rounded teaspoonful into 8 oz water and drink once a day    . rosuvastatin (CRESTOR) 5 MG tablet TAKE 1 TABLET BY MOUTH 3 TIMES A WEEK. (TAKE ON MONDAY, WEDNESDAY, AND FRIDAY) (Patient taking differently: Take 5 mg by mouth every Monday, Wednesday, and Friday. ) 30 tablet 5  . sodium chloride (OCEAN) 0.65 % SOLN nasal spray Place 1 spray into both nostrils as needed for congestion.    . sucralfate (CARAFATE) 1 g tablet TAKE 1 TABLET BY MOUTH BY MOUTH TWICE DAILY(ONE BEFORE DINNER) (Patient taking differently: Take 1 g by mouth 2 (two) times daily before a meal. ) 60 tablet 1  . traMADol (ULTRAM) 50 MG tablet Take 1/2 to 1 tablet by mouth three times daily as needed 30 tablet 1  . vitamin B-12 (CYANOCOBALAMIN) 1000 MCG tablet Take 1,000 mcg by mouth daily.      [name] VITAMIN D PO Take 2 tablets by mouth daily with breakfast.      No current facility-administered medications for  this encounter.     REVIEW OF SYSTEMS:  A 10+ POINT REVIEW OF SYSTEMS WAS OBTAINED including neurology, dermatology, psychiatry, cardiac, respiratory, lymph, extremities, GI, GU, musculoskeletal, constitutional,  reproductive, HEENT.    PHYSICAL EXAM:  height is 4' 10" (1.473 m) and weight is 128 lb 12.8 oz (58.4 kg). Her temporal temperature is 98.9 F (37.2 C). Her blood pressure is 182/72 (abnormal) and her pulse is 63. Her respiration is 18 and oxygen saturation is 98%.   General: Alert and oriented, in no acute distress HEENT: Head is normocephalic. Extraocular movements are intact. Oropharynx is clear. Neck: Neck is supple, no palpable cervical or supraclavicular lymphadenopathy. Heart: Regular in rate and rhythm with no murmurs, rubs, or gallops. Chest: Clear to auscultation bilaterally, with no rhonchi, wheezes, or rales. Abdomen: Soft, nontender, nondistended, with no rigidity or guarding.  The patient points to discomfort in the epigastric region.  No palpable mass in this area. Extremities: No cyanosis or edema. Lymphatics: see Neck Exam Skin: No concerning lesions. Musculoskeletal: symmetric strength and muscle tone throughout. Neurologic: Cranial nerves II through XII are grossly intact. No obvious focalities. Speech is fluent. Coordination is intact. Psychiatric: Judgment and insight are intact. Affect is appropriate.   ECOG = 1  0 - Asymptomatic (Fully active, able to carry on all predisease activities without restriction)  1 - Symptomatic but completely ambulatory (Restricted in physically strenuous activity but ambulatory and able to carry out work of a light or sedentary nature. For example, light housework, office work)  2 - Symptomatic, <50% in bed during the day (Ambulatory and capable of all self care but unable to carry out any work activities. Up and about more than 50% of waking hours)  3 - Symptomatic, >50% in bed, but not bedbound (Capable of only limited  self-care, confined to bed or chair 50% or more of waking hours)  4 - Bedbound (Completely disabled. Cannot carry on any self-care. Totally confined to bed or chair)  5 - Death   Eustace Pen MM, Creech RH, Tormey DC, et al. [phone]). "Toxicity and response criteria of the [facility]". [place] Oncol. 5 (6): 649-55  LABORATORY DATA:  Lab Results  Component Value Date   WBC 8.9 12/07/2018   HGB 11.0 (L) 12/07/2018   HCT 33.1 (L) 12/07/2018   MCV 93.4 12/07/2018   PLT 495.0 (H) 12/07/2018   NEUTROABS 5.9 12/07/2018   Lab Results  Component Value Date   NA 134 (L) 12/16/2018   K 4.0 12/16/2018   CL 98 12/16/2018   CO2 29 12/16/2018   GLUCOSE 136 (H) 12/16/2018   CREATININE 0.63 12/16/2018   CALCIUM 9.3 12/16/2018      RADIOGRAPHY: No results found.    IMPRESSION/PLAN:  Adenocarcinoma of the pancreatic head, uT2N1.  Options for management include observation given patient's advanced age, chemotherapy followed by restaging and possible surgery, stereotactic body radiation therapy and finally palliative chemotherapy.  Patient had a long discussion with Dr. Barry Dienes and her recommendations were for observation or SBRT.  Patient has met with Dr. Lorenso Courier for detailed discussion of chemotherapy options.  I discussed in detail the radiation therapy options including fractionated radiation therapy or stereotactic body radiation therapy.  Patient appears to be symptomatic from her pancreatic mass with some upper abdominal pain and possibly mid back pain. We discussed the available radiation techniques, and focused on the details of logistics and delivery.  We reviewed the anticipated acute and late sequelae associated with radiation in this setting.  The patient was encouraged to ask questions that I answered to the best of my ability  Discussed with the patient that she would need to have fiducial  markers placed within the pancreas prior to proceeding with stereotactic body  radiation therapy.  After careful evaluation and discussion with her daughter and the patient has decided to proceed with SBRT for initial management.  The patient will be scheduled for placement of fiducial markers through gastroenterology.  After this is complete she will return for SBRT planning.  Anticipate 5-6 SBRT treatments depending on dose volume constraints related to the small bowel and other critical structures in this area.     ------------------------------------------------  Blair Promise, PhD, MD  This document serves as a record of services personally performed by Gery Pray, MD. It was created on his behalf by Clerance Lav, a trained medical scribe. The creation of this record is based on the scribe's personal observations and the provider's statements to them. This document has been checked and approved by the attending provider.

## 2018-12-31 NOTE — Progress Notes (Signed)
[name]   [phone]  PROGRESS NOTE  Patient Care Team: Lucille Passy, MD as PCP - General (Family Medicine) Josue Hector, MD as PCP - Cardiology (Cardiology) Neldon Mc, MD as Surgeon (General Surgery) Gery Pray, MD (Radiation Oncology) Magrinat, Virgie Dad, MD (Hematology and Oncology) Cameron Sprang, MD as Consulting Physician (Neurology) Arna Snipe, RN as Registered Nurse Arna Snipe, RN as Oncology Nurse Navigator  Hematological/Oncological History  # Pancreatic Adenocarcinoma Stage IA (T1N0M0) 1) 11/26/2018: patient had visit with PCP for pruritis, found to have elevated LFTs 2) 11/27/2018: patient presented to the ED. Underwent CT scan which revealed 1.9 x 1.6 x 1.6 cm cm mass in the head of the pancreas with biliary duct dilation 3) 11/28/2018: ERCP performed to place stent for biliary obstruction  4) 12/10/2018: EUS w/ FNA reveals adenocarcinoma of the pancreas 5) 12/18/2018: establish care with Dr. Lorenso Courier  6) 12/23/2018: surgical visit with Dr. Barry Dienes 7) 12/30/2018: radiation oncology visit with Dr. Sondra Come. Agreeable to SBRT to pancreatic mass.   #Breast Cancer T2 N0, stage IIA invasive ductal carcinoma, grade 3,  ER+/PR+/HER-2 not amplified 1) December 2012:  leftlumpectomy and sentinel lymph node biopsy  2) March 2013: completed radiation therapy, started on letrozole 3) Feb 2018: completed letrozole therapy   HISTORY OF PRESENTING ILLNESS:  [name] 83 y.o. female with medical history significant for CAD s/p CABG in 2011, OSA on CPAP, Breast Cancer, and GERD who presents for a follow up for a recently diagnosed adenocarcinoma of the pancreas.   In the interim [name] has been examined by both Dr. Barry Dienes (surgery) and Dr. Sondra Come (radiation oncology). Dr. Barry Dienes noted that surgery was technically feasible, though she was concerned about the tumors proximity to the vessels. She noted that  neoadjuvant chemotherapy prior to surgery would be preferable if surgery was the treatment of choice. Dr. Sondra Come examined the patient as well and offered palliative radiation to the mass. After considering her options [name] was agreeable to radiation therapy.  On exam today [name] notes her pain level is increasing. She is having the band of pain across her abdomen which is present in a colicky fashion. She notes having an increase in gas/belching, though she denies any diarrhea. She has begun taking tramadol for the pain at night, which has been helping. She continues to take tylenol during the day which has been helpful. Her appetite has been ok and her weight stable. She also notes a pain in the left side of her back which she notes feels like a muscle spasm.   A 10 point ROS was otherwise negative.    MEDICAL HISTORY:  Past Medical History:  Diagnosis Date   Asthma    Atrophic vaginitis    Breast cancer, Left 12/20/2010   NO BLOOD PRESSURE CHECKS OR STICKS IN LEFT ARM   CAD (coronary artery disease)    a. 01/19/2010 s/p CABG x 3, lima->lad, vg->diag, vg->om1;  b. 06/2011 :Lexi MV: EF 84%, No ischemia. c. 01/06/14 s/p negative nuclear stress test with EF >70%   Cervical arthritis    Colonic polyp 04-27-2009   tubular adenoma   Diverticulosis of colon (without mention of hemorrhage)    Fibromyalgia    Gallstones    GERD (gastroesophageal reflux disease)    Glaucoma    Headache(784.0)    a. frequently assocaited with high BPs   Hiatal hernia    Hypercholesterolemia    Irritable bowel syndrome  Labile hypertension    Lymphedema    left arm   Pancreatic cancer ([place])    Pinched vertebral nerve    Secondary diabetes ([place]) 12/03/2018    SURGICAL HISTORY: Past Surgical History:  Procedure Laterality Date   71 HOUR [place] STUDY N/A 01/08/2016   Procedure: 24 HOUR PH STUDY;  Surgeon: Ladene Artist, MD;  Location: [facility] ENDOSCOPY;  Service: Endoscopy;   Laterality: N/A;   BILIARY BRUSHING  11/28/2018   Procedure: BILIARY BRUSHING;  Surgeon: Gatha Mayer, MD;  Location: [facility] ENDOSCOPY;  Service: Endoscopy;;   BILIARY STENT PLACEMENT  11/28/2018   Procedure: BILIARY STENT PLACEMENT;  Surgeon: Gatha Mayer, MD;  Location: [place];  Service: Endoscopy;;   BREAST LUMPECTOMY Left 02/06/11   CATARACT EXTRACTION, BILATERAL     bilateral caaract removal,   COLONOSCOPY     CORONARY ANGIOPLASTY WITH STENT PLACEMENT     Stent 2007   CORONARY ARTERY BYPASS GRAFT     ERCP N/A 11/28/2018   Procedure: ENDOSCOPIC RETROGRADE CHOLANGIOPANCREATOGRAPHY (ERCP);  Surgeon: Gatha Mayer, MD;  Location: [facility] ENDOSCOPY;  Service: Endoscopy;  Laterality: N/A;   ESOPHAGEAL MANOMETRY N/A 01/08/2016   Procedure: ESOPHAGEAL MANOMETRY (EM);  Surgeon: Ladene Artist, MD;  Location: [facility]OS[facility];  Service: Endoscopy;  Laterality: N/A;   ESOPHAGOGASTRODUODENOSCOPY     ESOPHAGOGASTRODUODENOSCOPY (EGD) WITH PROPOFOL N/A 12/10/2018   Procedure: ESOPHAGOGASTRODUODENOSCOPY (EGD) WITH PROPOFOL;  Surgeon: [name], MD;  Location: [facility] ENDOS[facility];  Service: Endoscopy;  Laterality: N/A;   EUS N/A 12/10/2018   Procedure: UPPER ENDOSCOPIC ULTRASOUND (EUS) RADIAL;  Surgeon: [name], MD;  Location: [facility] ENDOS[facility];  Service: Endoscopy;  Laterality: N/A;   FINE NEEDLE ASPIRATION N/A 12/10/2018   Procedure: FINE NEEDLE ASPIRATION (FNA) LINEAR;  Surgeon: [name], MD;  Location: [facility]OS[facility];  Service: Endoscopy;  Laterality: N/A;    SOCIAL HISTORY: Social History   Socioeconomic History   Marital status: Married    Spouse name: Gildardo Griffes. McCaskill   Number of children: 2   Years of education: 12   Highest education level: Not on file  Occupational History   Occupation: retired    Fish farm manager: [employer]  Social Needs   Financial resource strain: Not on file   Food insecurity    Worry: Not on file    Inability: Not on  file   Transportation needs    Medical: Not on file    Non-medical: Not on file  Tobacco Use   Smoking status: Never Smoker   Smokeless tobacco: Never Used  Substance and Sexual Activity   Alcohol use: No    Alcohol/week: 0.0 standard drinks   Drug use: No   Sexual activity: Not Currently    Partners: Male    Birth control/protection: Post-menopausal  Lifestyle   Physical activity    Days per week: Not on file    Minutes per session: Not on file   Stress: Not on file  Relationships   Social connections    Talks on phone: Not on file    Gets together: Not on file    Attends religious service: Not on file    Active member of club or organization: Not on file    Attends meetings of clubs or organizations: Not on file    Relationship status: Not on file   Intimate partner violence    Fear of current or ex partner: Not on file    Emotionally abused: Not on file  Physically abused: Not on file    Forced sexual activity: Not on file  Other Topics Concern   Not on file  Social History Narrative   Patient lives at home with her husband Marcello Moores). 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    Caffeine- sometimes- One cup of coffee.   Right handed.   Two daughters   Four granddaughters.   No EtOH, Tobacco, drugs    FAMILY HISTORY: Family History  Problem Relation Age of Onset   Stroke Mother    Stroke Father    Prostate cancer Father    Breast cancer Sister    Diabetes Sister    Cancer Brother        gland cancer   Heart disease Brother    Colon cancer Neg Hx    Stomach cancer Neg Hx    Pancreatic cancer Neg Hx    Kidney disease Neg Hx    Liver disease Neg Hx     ALLERGIES:  is allergic to levaquin [levofloxacin in d5w]; choline fenofibrate; hctz [hydrochlorothiazide]; simvastatin; adhesive [tape]; bentyl [dicyclomine hcl]; ceclor [cefaclor]; clarithromycin; codeine; doxycycline; hydrocodone;  lisinopril; penicillins; and tobramycin-dexamethasone.  MEDICATIONS:  Current Outpatient Medications  Medication Sig Dispense Refill   acetaminophen (TYLENOL) 650 MG CR tablet Take 650 mg by mouth every 8 (eight) hours as needed for pain.     albuterol (VENTOLIN HFA) 108 (90 Base) MCG/ACT inhaler Inhale 2 puffs into the lungs every 6 (six) hours as needed for wheezing or shortness of breath.     amLODipine (NORVASC) 5 MG tablet Take 1 tablet (5 mg total) by mouth daily. 90 tablet 3   aspirin EC 81 MG tablet Take 81 mg by mouth daily.     blood glucose meter kit and supplies KIT Dispense based on patient and insurance preference.Check fasting blood sugars daily (FOR ICD-9 250.00, 250.01). 1 each 0   CALCIUM PO Take 1 tablet by mouth daily with breakfast.      EPINEPHrine 0.3 mg/0.3 mL IJ SOAJ injection Inject 0.3 mg into the muscle as needed (as directed- FOR A SEVERE REACTION).      fexofenadine (ALLEGRA) 180 MG tablet Take 180 mg by mouth daily as needed (for seasonal allergies).      fluticasone (FLONASE) 50 MCG/ACT nasal spray Place 1-2 sprays into both nostrils daily as needed for allergies or rhinitis.     Glucosamine-Chondroit-Vit C-Mn (GLUCOSAMINE CHONDR 1500 COMPLX PO) Take 1 tablet by mouth 3 (three) times daily.       hydrALAZINE (APRESOLINE) 50 MG tablet Take 0.5 tablets (25 mg total) by mouth 3 (three) times daily. (Patient taking differently: Take 25 mg by mouth 2 (two) times daily. ) 135 tablet 1   latanoprost (XALATAN) 0.005 % ophthalmic solution Place 1 drop into both eyes at bedtime.      losartan (COZAAR) 100 MG tablet TAKE 1/2 TABLET BY MOUTH THREE TIMES A DAY (Patient taking differently: Take 50 mg by mouth 2 (two) times daily. ) 135 tablet 0   metoprolol tartrate (LOPRESSOR) 50 MG tablet TAKE 1 TABLET BY MOUTH THREE TIMES A DAY (Patient taking differently: Take 50 mg by mouth 3 (three) times daily. ) 270 tablet 3   nitroGLYCERIN (NITROSTAT) 0.4 MG SL tablet  Place 1 tablet (0.4 mg total) under the tongue every 5 (five) minutes as needed for chest pain. 25 tablet 2   pantoprazole (PROTONIX) 40 MG tablet Take 1 tablet (40  mg total) by mouth 2 (two) times daily. Take 30-60 minutes before breakfast 60 tablet 11   Psyllium (METAMUCIL FIBER PO) Take by mouth See admin instructions. Mix one rounded teaspoonful into 8 oz water and drink once a day     rosuvastatin (CRESTOR) 5 MG tablet TAKE 1 TABLET BY MOUTH 3 TIMES A WEEK. (TAKE ON MONDAY, WEDNESDAY, AND FRIDAY) (Patient taking differently: Take 5 mg by mouth every Monday, Wednesday, and Friday. ) 30 tablet 5   sodium chloride (OCEAN) 0.65 % SOLN nasal spray Place 1 spray into both nostrils as needed for congestion.     sucralfate (CARAFATE) 1 g tablet TAKE 1 TABLET BY MOUTH BY MOUTH TWICE DAILY(ONE BEFORE DINNER) (Patient taking differently: Take 1 g by mouth 2 (two) times daily before a meal. ) 60 tablet 1   traMADol (ULTRAM) 50 MG tablet Take 1/2 to 1 tablet by mouth three times daily as needed 30 tablet 1   vitamin B-12 (CYANOCOBALAMIN) 1000 MCG tablet Take 1,000 mcg by mouth daily.       VITAMIN D PO Take 2 tablets by mouth daily with breakfast.      No current facility-administered medications for this visit.     REVIEW OF SYSTEMS:   Constitutional: ( - ) fevers, ( + )  chills , ( - ) night sweats Eyes: ( - ) blurriness of vision, ( - ) double vision, ( - ) watery eyes Ears, nose, mouth, throat, and face: ( - ) mucositis, ( - ) sore throat Respiratory: ( - ) cough, ( - ) dyspnea, ( - ) wheezes Cardiovascular: ( - ) palpitation, ( - ) chest discomfort, ( - ) lower extremity swelling Gastrointestinal:  ( - ) nausea, ( - ) heartburn, ( - ) change in bowel habits Skin: ( - ) abnormal skin rashes Lymphatics: ( - ) new lymphadenopathy, ( - ) easy bruising Neurological: ( - ) numbness, ( - ) tingling, ( - ) new weaknesses Behavioral/Psych: ( - ) mood change, ( - ) new changes  All other systems  were reviewed with the patient and are negative.  PHYSICAL EXAMINATION: ECOG PERFORMANCE STATUS: 2 - Symptomatic, <50% confined to bed  There were no vitals filed for this visit. There were no vitals filed for this visit.  GENERAL: pleasant, well appearing elderly Caucasian female in NAD  SKIN: skin color, texture, turgor are normal, no rashes or significant lesions EYES: conjunctiva are pink and non-injected. Mild jaundice evident in the sclera.  LYMPH:  no palpable lymphadenopathy in the cervical, axillary or inguinal LUNGS: clear to auscultation and percussion with normal breathing effort HEART: regular rate & rhythm and no murmurs and no lower extremity edema ABDOMEN: mild tenderness in the center of the abdomen, just below the xyphoid process.  Musculoskeletal: no cyanosis of digits and no clubbing  PSYCH: alert & oriented x 3, fluent speech NEURO: no focal motor/sensory deficits  LABORATORY DATA:  I have reviewed the data as listed Lab Results  Component Value Date   WBC 8.9 12/07/2018   HGB 11.0 (L) 12/07/2018   HCT 33.1 (L) 12/07/2018   MCV 93.4 12/07/2018   PLT 495.0 (H) 12/07/2018   NEUTROABS 5.9 12/07/2018    RADIOGRAPHIC STUDIES: No new interval imaging since last visit.  ASSESSMENT & PLAN JAMAR CASAGRANDE 83 y.o. female with medical history significant for CAD s/p CABG in 2011, OSA on CPAP, Breast Cancer, and GERD who presents for a follow up for a recently  diagnosed adenocarcinoma of the pancreas.  She has had the opportunity to meet with surgery, radiation oncology, and medical oncology. After weighing the options available she and her family were agreeable to pursuing treatment with SBRT. Today we discussed some of the risks/benefits of SBRT treatment. We discussed that this will likely extend the duration of her life, but that it is not a curative therapy. I re-iterated my concern about surgery and chemotherapy as treatment options in the future and she was in  agreement that pursuing these were no in line with her goals of care.   #Recently Diagnosed Adenocarcinoma of the Pancreas, Stage IA (T1N0M0) --In the interim Dr. Barry Dienes has had the opportunity to review the scans and discuss surgical treatment options with the patient. Per Dr. Barry Dienes the surgery is technically feasible, though she would prefer neoadjuvant chemotherapy to reduce the size of the tumor and insure no invasion of the vessels if surgery was to be performed.  --after reviewing patients functional status and ECOG 2 I think the patient is possible a candidate for chemotherapy (likely only monotherapy gemcitabine (J Clin Oncol. 1997 Jun; 15(6):2403-13.)), though I do not favor that approach. After discussion today the patient noted she would prefer treatment with radiation over chemotherapy.  --patient met with Dr. Sondra Come on 12/30/2018. She was offered SBRT as a palliative measure ( Cancer. 2017;123(18):3486-93.) and was agreeable to the treatment. She will be scheduled with GI for fiducial markers. I am in agreement with this management plan.  --RTC in 4 weeks to assess symptoms.   #Breast Cancer T2 N0, stage IIA invasive ductal carcinoma, grade 3,  ER+/PR+/HER-2 not amplified --completed 83 year old letrozole therapy in Feb 2018 --previously followed by Dr. Gunnar Bulla Magrinat at [facility] --in remission   #Cancer Related Pain --reports increased pain today across the top of the abdomen. --now taking nightly tramadol and takes 2 x '500mg'$  tylenol prior to bed --LFTs have normalized, OK to take tylenol at above dosage --will continue to monitor for abdominal pain  #Symptom Management --increased gas/belching, but no diarrhea. Possible initial signs of pancreatic insufficiency. Continue to monitor and consider pancreatic enzymes if progressive. --increased itching and mild jaundice. Continue to monitor. If worsening, could be sign of stent obstruction.  --mild nausea, at patient's baseline. No  diarrhea  #Goals Of Care --patient notes she wants to maintain a high quality of life and does not think that surgery/chemotherapy would allow that. I agree with her assessment --referral to [facility] for introductions, assistance with symptoms management, and preliminary hospice discussions.    All questions were answered. The patient knows to call the clinic with any problems, questions or concerns.  A total of more than 30 minutes were spent face-to-face with the patient during this encounter and over half of that time was spent on counseling and coordination of care as outlined above.   Ledell Peoples, MD Department of Hematology/Oncology [place] at [facility] Phone: [phone] Pager: [phone] Email: [email or web address]   12/31/2018 5:11 PM   Literature Support:  Nyra Capes, Switchenko Hewitt Shorts RJ, [place], [place] T, et al. Outcomes for patients with locally advanced pancreatic adenocarcinoma treated with stereotactic body radiation therapy versus conventionally fractionated radiation. Cancer. 2017;123(18):3486-93.  --Among 8450 patients, 7819 (92.5%) were treated with CFRT, and 631 (7.5%) underwent SBRT. Receipt of SBRT was associated with superior OS in the multivariate analysis (hazard ratio, 0.84; 95% confidence interval, 0.75?0.93; P?<?.001). With propensity score matching, 988 patients  in all were matched, with 494 patients in each cohort. Within the propensity?matched cohorts, the median OS (13.9 vs 11.6 months) and the 2?year OS rate (21.7% vs 16.5%) were significantly higher with SBRT versus CFRT (P?=?.0014).

## 2019-01-01 ENCOUNTER — Other Ambulatory Visit: Payer: Self-pay | Admitting: Gastroenterology

## 2019-01-01 ENCOUNTER — Inpatient Hospital Stay: Payer: Medicare Other | Attending: Hematology and Oncology | Admitting: Hematology and Oncology

## 2019-01-01 ENCOUNTER — Other Ambulatory Visit: Payer: Self-pay

## 2019-01-01 ENCOUNTER — Telehealth: Payer: Self-pay | Admitting: *Deleted

## 2019-01-01 ENCOUNTER — Telehealth: Payer: Self-pay | Admitting: Hematology and Oncology

## 2019-01-01 ENCOUNTER — Telehealth: Payer: Self-pay | Admitting: Licensed Clinical Social Worker

## 2019-01-01 VITALS — BP 145/62 | HR 71 | Temp 98.3°F | Resp 17 | Ht <= 58 in | Wt 128.1 lb

## 2019-01-01 DIAGNOSIS — Z8042 Family history of malignant neoplasm of prostate: Secondary | ICD-10-CM | POA: Diagnosis not present

## 2019-01-01 DIAGNOSIS — R110 Nausea: Secondary | ICD-10-CM | POA: Insufficient documentation

## 2019-01-01 DIAGNOSIS — R109 Unspecified abdominal pain: Secondary | ICD-10-CM | POA: Insufficient documentation

## 2019-01-01 DIAGNOSIS — C250 Malignant neoplasm of head of pancreas: Secondary | ICD-10-CM

## 2019-01-01 DIAGNOSIS — Z803 Family history of malignant neoplasm of breast: Secondary | ICD-10-CM | POA: Insufficient documentation

## 2019-01-01 DIAGNOSIS — I10 Essential (primary) hypertension: Secondary | ICD-10-CM | POA: Insufficient documentation

## 2019-01-01 DIAGNOSIS — Z881 Allergy status to other antibiotic agents status: Secondary | ICD-10-CM | POA: Insufficient documentation

## 2019-01-01 DIAGNOSIS — Z808 Family history of malignant neoplasm of other organs or systems: Secondary | ICD-10-CM | POA: Insufficient documentation

## 2019-01-01 DIAGNOSIS — G893 Neoplasm related pain (acute) (chronic): Secondary | ICD-10-CM | POA: Diagnosis not present

## 2019-01-01 DIAGNOSIS — K589 Irritable bowel syndrome without diarrhea: Secondary | ICD-10-CM | POA: Insufficient documentation

## 2019-01-01 DIAGNOSIS — I2510 Atherosclerotic heart disease of native coronary artery without angina pectoris: Secondary | ICD-10-CM | POA: Insufficient documentation

## 2019-01-01 DIAGNOSIS — Z79899 Other long term (current) drug therapy: Secondary | ICD-10-CM | POA: Diagnosis not present

## 2019-01-01 DIAGNOSIS — E119 Type 2 diabetes mellitus without complications: Secondary | ICD-10-CM | POA: Diagnosis not present

## 2019-01-01 DIAGNOSIS — C259 Malignant neoplasm of pancreas, unspecified: Secondary | ICD-10-CM | POA: Diagnosis not present

## 2019-01-01 DIAGNOSIS — Z880 Allergy status to penicillin: Secondary | ICD-10-CM | POA: Diagnosis not present

## 2019-01-01 DIAGNOSIS — Z8249 Family history of ischemic heart disease and other diseases of the circulatory system: Secondary | ICD-10-CM | POA: Diagnosis not present

## 2019-01-01 DIAGNOSIS — Z823 Family history of stroke: Secondary | ICD-10-CM | POA: Insufficient documentation

## 2019-01-01 DIAGNOSIS — Z853 Personal history of malignant neoplasm of breast: Secondary | ICD-10-CM | POA: Diagnosis not present

## 2019-01-01 DIAGNOSIS — Z833 Family history of diabetes mellitus: Secondary | ICD-10-CM | POA: Insufficient documentation

## 2019-01-01 DIAGNOSIS — Z170 Estrogen receptor positive status [ER+]: Secondary | ICD-10-CM | POA: Insufficient documentation

## 2019-01-01 DIAGNOSIS — K219 Gastro-esophageal reflux disease without esophagitis: Secondary | ICD-10-CM | POA: Diagnosis not present

## 2019-01-01 DIAGNOSIS — Z885 Allergy status to narcotic agent status: Secondary | ICD-10-CM | POA: Diagnosis not present

## 2019-01-01 DIAGNOSIS — G4733 Obstructive sleep apnea (adult) (pediatric): Secondary | ICD-10-CM | POA: Insufficient documentation

## 2019-01-01 NOTE — Telephone Encounter (Signed)
Scheduled per los. Called and left msg. Printout will be mailed

## 2019-01-01 NOTE — Telephone Encounter (Signed)
TCT [facility] Care department for palliative care referral for [name] for symptom management and discussions related to eventual Hospice services.. Left vm message with Quincy Sheehan regarding referral with call back # provided of [phone]

## 2019-01-01 NOTE — Telephone Encounter (Signed)
Palliative Care SW spoke with patient and scheduled a home visit for Tuesday, 11/10, at 1pm.

## 2019-01-04 ENCOUNTER — Ambulatory Visit: Payer: Medicare Other | Admitting: Family Medicine

## 2019-01-04 ENCOUNTER — Other Ambulatory Visit: Payer: Self-pay | Admitting: Hematology and Oncology

## 2019-01-04 ENCOUNTER — Telehealth: Payer: Self-pay | Admitting: *Deleted

## 2019-01-04 ENCOUNTER — Other Ambulatory Visit: Payer: Self-pay

## 2019-01-04 ENCOUNTER — Other Ambulatory Visit: Payer: Self-pay | Admitting: *Deleted

## 2019-01-04 ENCOUNTER — Telehealth: Payer: Self-pay

## 2019-01-04 ENCOUNTER — Other Ambulatory Visit (HOSPITAL_COMMUNITY)
Admission: RE | Admit: 2019-01-04 | Discharge: 2019-01-04 | Disposition: A | Discharge status: Home / Self Care | Payer: Medicare Other | Source: Ambulatory Visit | Attending: Gastroenterology | Admitting: Gastroenterology

## 2019-01-04 ENCOUNTER — Telehealth: Payer: Self-pay | Admitting: Hematology and Oncology

## 2019-01-04 ENCOUNTER — Inpatient Hospital Stay: Payer: Medicare Other

## 2019-01-04 DIAGNOSIS — K589 Irritable bowel syndrome without diarrhea: Secondary | ICD-10-CM | POA: Diagnosis not present

## 2019-01-04 DIAGNOSIS — C259 Malignant neoplasm of pancreas, unspecified: Secondary | ICD-10-CM | POA: Diagnosis not present

## 2019-01-04 DIAGNOSIS — K219 Gastro-esophageal reflux disease without esophagitis: Secondary | ICD-10-CM | POA: Diagnosis not present

## 2019-01-04 DIAGNOSIS — R110 Nausea: Secondary | ICD-10-CM | POA: Diagnosis not present

## 2019-01-04 DIAGNOSIS — R109 Unspecified abdominal pain: Secondary | ICD-10-CM | POA: Diagnosis not present

## 2019-01-04 DIAGNOSIS — Z01812 Encounter for preprocedural laboratory examination: Secondary | ICD-10-CM | POA: Diagnosis not present

## 2019-01-04 DIAGNOSIS — Z20828 Contact with and (suspected) exposure to other viral communicable diseases: Secondary | ICD-10-CM | POA: Diagnosis not present

## 2019-01-04 DIAGNOSIS — I2510 Atherosclerotic heart disease of native coronary artery without angina pectoris: Secondary | ICD-10-CM | POA: Diagnosis not present

## 2019-01-04 DIAGNOSIS — C250 Malignant neoplasm of head of pancreas: Secondary | ICD-10-CM

## 2019-01-04 LAB — CMP (CANCER CENTER ONLY)
ALT: 88 U/L — ABNORMAL HIGH (ref 0–44)
AST: 43 U/L — ABNORMAL HIGH (ref 15–41)
Albumin: 3.1 g/dL — ABNORMAL LOW (ref 3.5–5.0)
Alkaline Phosphatase: 411 U/L — ABNORMAL HIGH (ref 38–126)
Anion gap: 12 (ref 5–15)
BUN: 9 mg/dL (ref 8–23)
CO2: 27 mmol/L (ref 22–32)
Calcium: 9.3 mg/dL (ref 8.9–10.3)
Chloride: 95 mmol/L — ABNORMAL LOW (ref 98–111)
Creatinine: 0.72 mg/dL (ref 0.44–1.00)
GFR, Est AFR Am: >60 mL/min (ref >60)
GFR, Estimated: >60 mL/min (ref >60)
Glucose, Bld: 170 mg/dL — ABNORMAL HIGH (ref 70–99)
Potassium: 3.1 mmol/L — ABNORMAL LOW (ref 3.5–5.1)
Sodium: 134 mmol/L — ABNORMAL LOW (ref 135–145)
Total Bilirubin: 8 mg/dL (ref 0.3–1.2)
Total Protein: 7.1 g/dL (ref 6.5–8.1)

## 2019-01-04 LAB — CBC WITH DIFFERENTIAL (CANCER CENTER ONLY)
Abs Immature Granulocytes: 0.02 10*3/uL (ref 0.00–0.07)
Basophils Absolute: 0 10*3/uL (ref 0.0–0.1)
Basophils Relative: 1 %
Eosinophils Absolute: 0.2 10*3/uL (ref 0.0–0.5)
Eosinophils Relative: 3 %
HCT: 29.6 % — ABNORMAL LOW (ref 36.0–46.0)
Hemoglobin: 10.3 g/dL — ABNORMAL LOW (ref 12.0–15.0)
Immature Granulocytes: 0 %
Lymphocytes Relative: 31 %
Lymphs Abs: 2.4 10*3/uL (ref 0.7–4.0)
MCH: 30.2 pg (ref 26.0–34.0)
MCHC: 34.8 g/dL (ref 30.0–36.0)
MCV: 86.8 fL (ref 80.0–100.0)
Monocytes Absolute: 0.8 10*3/uL (ref 0.1–1.0)
Monocytes Relative: 10 %
Neutro Abs: 4.3 10*3/uL (ref 1.7–7.7)
Neutrophils Relative %: 55 %
Platelet Count: 398 10*3/uL (ref 150–400)
RBC: 3.41 MIL/uL — ABNORMAL LOW (ref 3.87–5.11)
RDW: 14.8 % (ref 11.5–15.5)
WBC Count: 7.7 10*3/uL (ref 4.0–10.5)
nRBC: 0 % (ref 0.0–0.2)

## 2019-01-04 MED ORDER — DIPHENHYDRAMINE HCL 50 MG PO TABS: 25.0000 mg | ORAL_TABLET | Freq: Two times a day (BID) | ORAL | 0 refills | Status: DC

## 2019-01-04 MED ORDER — POTASSIUM CHLORIDE CRYS ER 20 MEQ PO TBCR: 20.0000 meq | EXTENDED_RELEASE_TABLET | Freq: Every day | ORAL | 0 refills | Status: DC

## 2019-01-04 NOTE — Telephone Encounter (Signed)
Called [name] to discuss the results of her bloodwork on 01/04/19. She was last seen on Friday 01/01/19 and was having mild itching. We deferred labs at the time, noting that if symptoms worsened we could recheck bloodwork to assure the stent was working appropriately.   She called this AM noting that she had much worsening itching. We requested she have labs drawn to further assess.  Labs showed Tbili 8.0, AST 43, ALT 88, Alk phos 411. These are all markedly elevated from last measure on 12/16/2018.   [name] was called and notified. GI was also contacted on concern for stent obstruction and are currently in the process of trying to have her evaluated.   Ledell Peoples, MD Department of Hematology/Oncology [place] at [facility] Phone: [phone] Pager: [phone] Email: [email or web address]

## 2019-01-04 NOTE — Telephone Encounter (Signed)
Received call from patient. She states she has started itching pretty badly all over.. Last week she states she just felt itchy only a l;ittle bit, but now it is much worse.  She is unsure of any skin color changes in her sclera or palms of hands, but she does state that her urine has gotten much darker at times.  Denies fever, chills or any change in baseline RUQ and back pain.  Spoke with Dr. Lorenso Courier. He requests obtaining labs today, CMP, CBC and that pt can start Benadryl 25 mg BID for now for the itching. He will review labs later today.  Pt aware to come in for labs at 10:45 am today. Voices understanding regarding benadryl

## 2019-01-04 NOTE — Addendum Note (Signed)
Addended by: Marlon Pel on: 01/04/2019 03:49 PM   Modules accepted: Orders

## 2019-01-04 NOTE — Telephone Encounter (Signed)
Patient is notified of the recommendations for double procedure.  She verbalized understanding to arrive at 11:30 on 01/06/19.  She is advised to be NPO after midnight., She verbalized understanding of all instructions.

## 2019-01-04 NOTE — Telephone Encounter (Signed)
-----   Message from Ladene Artist, MD sent at 01/04/2019  2:15 PM EST ----- Regarding: FW: Treatment plan. Anysia Choi, This patient has an occluded biliary stent. She needs an ERCP/stent change with me in the next few day which I can do on Thursday at 12 noon, 12:30 pm or 1 pm. If that does not work I can do tomorrow sometime and cancel office patients. DJ is scheduling fiduciarl placement for SBRT soon however will not be until next week or the following week. MS ----- Message ----- From: [name], MD Sent: 12/24/2018   6:49 AM EST To: Ladene Artist, MD, Stark Klein, MD, # Subject: RE: Treatment plan.                            Okay, if the patient decides to go ahead with stereotactic radiation then either Gabe or I can proceed with fiducial placement and stent exchange at the same time.  If the patient decides not to go ahead with stereotactic radiation then she will not need fiducial placement and in that case Dr. Fuller Plan can change the stent at some point.   Thanks ----- Message ----- From: Stark Klein, MD Sent: 12/23/2018   4:27 PM EDT To: [name], MD, Ladene Artist, MD, # Subject: Treatment plan.                                I saw this very nice lady today.  From a surgical standpoint, I wouldn't say she is NOT a candidate for surgery, but it would be a very difficult recovery.  Their does appear to be possible invasion of the portal/SMV, so I would recommend neoadjuvant chemo (looks like single agent gemcitabine is what Dr. Lorenso Courier would consider) to make sure she doesn't have aggressive disease and to assist with getting negative margins without doing a vein resection.    I am referring her to discuss stereotactic radiation as a possible treatment.  Dr. Sondra Come, you treated her for breast cancer, and I didn't know if you do this or just Dr. Lisbeth Renshaw.    They are leaning away from surgery and chemo and more toward observation and/or XRT.    Dan/Carl/Malcolm, this would  mean stent change out if we are doing observation and fiducial placement.    Tx FB

## 2019-01-04 NOTE — Telephone Encounter (Signed)
Patient has been rescheduled for Wed to 01/06/19 1:00 at [facility] for ERCP and EUS with Dr. Rush Landmark.    Left message for patient to call back

## 2019-01-04 NOTE — Telephone Encounter (Signed)
ERCP scheduled for [facility] on 01/07/19.  She will go now for her COVID screen.     Left message for patient to call back to discuss the appt details.

## 2019-01-05 ENCOUNTER — Other Ambulatory Visit: Payer: Medicare Other | Admitting: *Deleted

## 2019-01-05 ENCOUNTER — Other Ambulatory Visit: Payer: Self-pay | Admitting: Radiation Oncology

## 2019-01-05 ENCOUNTER — Other Ambulatory Visit: Payer: Medicare Other | Admitting: Licensed Clinical Social Worker

## 2019-01-05 DIAGNOSIS — C250 Malignant neoplasm of head of pancreas: Secondary | ICD-10-CM

## 2019-01-05 DIAGNOSIS — Z515 Encounter for palliative care: Secondary | ICD-10-CM

## 2019-01-05 LAB — NOVEL CORONAVIRUS, NAA (HOSP ORDER, SEND-OUT TO REF LAB; TAT 18-24 HRS): SARS-CoV-2, NAA: NOT DETECTED

## 2019-01-05 NOTE — Progress Notes (Signed)
COMMUNITY PALLIATIVE CARE SW NOTE  PATIENT NAME: [name] DOB: 06-01-1935 MRN: [id number]  PRIMARY CARE PROVIDER: Lucille Passy, MD  RESPONSIBLE PARTY:  Acct ID - Guarantor Home Phone Work Phone Relationship Acct Type  [id number] [name]  Self P/F     [address], [place], [place] [address]     PLAN OF CARE and INTERVENTIONS:             1. GOALS OF CARE/ ADVANCE CARE PLANNING:  Patient's goal is to not be hospitalized in the future.  She is a full code. 2. SOCIAL/EMOTIONAL/SPIRITUAL ASSESSMENT/ INTERVENTIONS:  SW and Palliative Care RN, Daryl Eastern, met with patient and her husband, [name], in their home.  Tom had to leave for an MD appointment during the visit.  Patient has been married for 59 years.  The couple has two daughters and four grandchildren.  Patient also has two sisters and a brother that live in the area.  Patient spoke very fondly of her family.  Her mother was under Hospice care prior to her death.  Patient is knowledgeable of her cancer and addresses it openly.  Offered to assist with advanced care planning, but she declines at this time. 3. PATIENT/CAREGIVER EDUCATION/ COPING:  Patient copes by relying on her faith and church family. 4. PERSONAL EMERGENCY PLAN:  Family will contact EMS. 5. COMMUNITY RESOURCES COORDINATION/ HEALTH CARE NAVIGATION:  None. 6. FINANCIAL/LEGAL CONCERNS/INTERVENTIONS:  None.     SOCIAL HX:  Social History   Tobacco Use  . Smoking status: Never Smoker  . Smokeless tobacco: Never Used  Substance Use Topics  . Alcohol use: No    Alcohol/week: 0.0 standard drinks    CODE STATUS:  Full Code ADVANCED DIRECTIVES: No  MOST FORM COMPLETE:   No HOSPICE EDUCATION PROVIDED:  Yes PPS:  Patient's appetite is normal.  She can ambulate independently. Duration of visit and documentation:  75 minutes.      Creola Corn Judy Goodenow, LCSW

## 2019-01-05 NOTE — Progress Notes (Signed)
First attempt to call patient for Pre Procedure phone call with no answer. Left a VM. I also called the mobile number and was able to talk with her daughter who will check in with her mother and have her to call as well.

## 2019-01-06 ENCOUNTER — Encounter (HOSPITAL_COMMUNITY): Payer: Self-pay | Admitting: *Deleted

## 2019-01-06 ENCOUNTER — Encounter (HOSPITAL_COMMUNITY)
Admission: RE | Disposition: A | Discharge status: Home / Self Care | Payer: Self-pay | Source: Home / Self Care | Attending: Gastroenterology

## 2019-01-06 ENCOUNTER — Ambulatory Visit (HOSPITAL_COMMUNITY): Payer: Medicare Other | Admitting: Certified Registered"

## 2019-01-06 ENCOUNTER — Other Ambulatory Visit: Payer: Self-pay

## 2019-01-06 ENCOUNTER — Telehealth: Payer: Self-pay

## 2019-01-06 ENCOUNTER — Ambulatory Visit (HOSPITAL_COMMUNITY)
Admission: RE | Admit: 2019-01-06 | Discharge: 2019-01-06 | Disposition: A | Discharge status: Home / Self Care | Payer: Medicare Other | Attending: Gastroenterology | Admitting: Gastroenterology

## 2019-01-06 ENCOUNTER — Ambulatory Visit (HOSPITAL_COMMUNITY): Payer: Medicare Other

## 2019-01-06 DIAGNOSIS — K5710 Diverticulosis of small intestine without perforation or abscess without bleeding: Secondary | ICD-10-CM | POA: Diagnosis not present

## 2019-01-06 DIAGNOSIS — E139 Other specified diabetes mellitus without complications: Secondary | ICD-10-CM | POA: Diagnosis not present

## 2019-01-06 DIAGNOSIS — I10 Essential (primary) hypertension: Secondary | ICD-10-CM | POA: Insufficient documentation

## 2019-01-06 DIAGNOSIS — C259 Malignant neoplasm of pancreas, unspecified: Secondary | ICD-10-CM | POA: Diagnosis not present

## 2019-01-06 DIAGNOSIS — Z951 Presence of aortocoronary bypass graft: Secondary | ICD-10-CM | POA: Insufficient documentation

## 2019-01-06 DIAGNOSIS — Z79899 Other long term (current) drug therapy: Secondary | ICD-10-CM | POA: Diagnosis not present

## 2019-01-06 DIAGNOSIS — C250 Malignant neoplasm of head of pancreas: Secondary | ICD-10-CM

## 2019-01-06 DIAGNOSIS — K219 Gastro-esophageal reflux disease without esophagitis: Secondary | ICD-10-CM | POA: Diagnosis not present

## 2019-01-06 DIAGNOSIS — E089 Diabetes mellitus due to underlying condition without complications: Secondary | ICD-10-CM | POA: Diagnosis not present

## 2019-01-06 DIAGNOSIS — K831 Obstruction of bile duct: Secondary | ICD-10-CM | POA: Insufficient documentation

## 2019-01-06 DIAGNOSIS — H409 Unspecified glaucoma: Secondary | ICD-10-CM | POA: Diagnosis not present

## 2019-01-06 DIAGNOSIS — I2510 Atherosclerotic heart disease of native coronary artery without angina pectoris: Secondary | ICD-10-CM | POA: Diagnosis not present

## 2019-01-06 DIAGNOSIS — G4730 Sleep apnea, unspecified: Secondary | ICD-10-CM | POA: Insufficient documentation

## 2019-01-06 DIAGNOSIS — K8689 Other specified diseases of pancreas: Secondary | ICD-10-CM | POA: Diagnosis not present

## 2019-01-06 DIAGNOSIS — Z4659 Encounter for fitting and adjustment of other gastrointestinal appliance and device: Secondary | ICD-10-CM | POA: Insufficient documentation

## 2019-01-06 DIAGNOSIS — T859XXA Unspecified complication of internal prosthetic device, implant and graft, initial encounter: Secondary | ICD-10-CM

## 2019-01-06 DIAGNOSIS — G4733 Obstructive sleep apnea (adult) (pediatric): Secondary | ICD-10-CM | POA: Insufficient documentation

## 2019-01-06 DIAGNOSIS — J449 Chronic obstructive pulmonary disease, unspecified: Secondary | ICD-10-CM | POA: Insufficient documentation

## 2019-01-06 DIAGNOSIS — Z9889 Other specified postprocedural states: Secondary | ICD-10-CM

## 2019-01-06 DIAGNOSIS — R188 Other ascites: Secondary | ICD-10-CM | POA: Insufficient documentation

## 2019-01-06 DIAGNOSIS — E7800 Pure hypercholesterolemia, unspecified: Secondary | ICD-10-CM | POA: Diagnosis not present

## 2019-01-06 DIAGNOSIS — Z853 Personal history of malignant neoplasm of breast: Secondary | ICD-10-CM | POA: Insufficient documentation

## 2019-01-06 DIAGNOSIS — Z7982 Long term (current) use of aspirin: Secondary | ICD-10-CM | POA: Insufficient documentation

## 2019-01-06 HISTORY — PX: ESOPHAGOGASTRODUODENOSCOPY: SHX5428

## 2019-01-06 HISTORY — PX: ENDOSCOPIC RETROGRADE CHOLANGIOPANCREATOGRAPHY (ERCP) WITH PROPOFOL: SHX5810

## 2019-01-06 HISTORY — PX: EUS: SHX5427

## 2019-01-06 HISTORY — PX: STENT REMOVAL: SHX6421

## 2019-01-06 HISTORY — PX: FIDUCIAL MARKER PLACEMENT: SHX6858

## 2019-01-06 HISTORY — PX: BILIARY DILATION: SHX6850

## 2019-01-06 HISTORY — PX: BILIARY STENT PLACEMENT: SHX5538

## 2019-01-06 HISTORY — PX: SPHINCTEROTOMY: SHX5279

## 2019-01-06 LAB — GLUCOSE, CAPILLARY: Glucose-Capillary: 120 mg/dL — ABNORMAL HIGH (ref 70–99)

## 2019-01-06 SURGERY — ENDOSCOPIC RETROGRADE CHOLANGIOPANCREATOGRAPHY (ERCP) WITH PROPOFOL
Anesthesia: General

## 2019-01-06 MED ORDER — GLUCAGON HCL RDNA (DIAGNOSTIC) 1 MG IJ SOLR
INTRAMUSCULAR | Status: DC | PRN
  Administered 2019-01-06: 0.25 mg via INTRAVENOUS

## 2019-01-06 MED ORDER — SODIUM CHLORIDE 0.9 % IV SOLN: 1.5000 g | Freq: Once | INTRAVENOUS | Status: DC

## 2019-01-06 MED ORDER — ROCURONIUM BROMIDE 10 MG/ML (PF) SYRINGE
PREFILLED_SYRINGE | INTRAVENOUS | Status: DC | PRN
  Administered 2019-01-06: 30 mg via INTRAVENOUS

## 2019-01-06 MED ORDER — INDOMETHACIN 50 MG RE SUPP: 100.0000 mg | Freq: Once | RECTAL | Status: DC

## 2019-01-06 MED ORDER — SODIUM CHLORIDE 0.9 % IV SOLN: INTRAVENOUS | Status: DC

## 2019-01-06 MED ORDER — SODIUM CHLORIDE 0.9 % IV SOLN
INTRAVENOUS | Status: DC | PRN
  Administered 2019-01-06: 15:00:00 20 mL

## 2019-01-06 MED ORDER — SUGAMMADEX SODIUM 200 MG/2ML IV SOLN
INTRAVENOUS | Status: DC | PRN
  Administered 2019-01-06: 150 mg via INTRAVENOUS

## 2019-01-06 MED ORDER — LACTATED RINGERS IV SOLN
INTRAVENOUS | Status: DC
  Administered 2019-01-06: 1000 mL via INTRAVENOUS

## 2019-01-06 MED ORDER — INDOMETHACIN 50 MG RE SUPP
RECTAL | Status: DC | PRN
  Administered 2019-01-06: 100 mg via RECTAL

## 2019-01-06 MED ORDER — FENTANYL CITRATE (PF) 250 MCG/5ML IJ SOLN: INTRAMUSCULAR | Status: DC | PRN

## 2019-01-06 MED ORDER — SUCCINYLCHOLINE CHLORIDE 200 MG/10ML IV SOSY
PREFILLED_SYRINGE | INTRAVENOUS | Status: DC | PRN
  Administered 2019-01-06: 120 mg via INTRAVENOUS

## 2019-01-06 MED ORDER — CIPROFLOXACIN IN D5W 400 MG/200ML IV SOLN
400.0000 mg | Freq: Once | INTRAVENOUS | Status: AC
  Administered 2019-01-06: 400 mg via INTRAVENOUS

## 2019-01-06 MED ORDER — CIPROFLOXACIN HCL 500 MG PO TABS: 500.0000 mg | ORAL_TABLET | Freq: Two times a day (BID) | ORAL | 0 refills | Status: AC

## 2019-01-06 MED ORDER — PROPOFOL 10 MG/ML IV BOLUS
INTRAVENOUS | Status: DC | PRN
  Administered 2019-01-06: 100 mg via INTRAVENOUS

## 2019-01-06 MED ORDER — LIDOCAINE 2% (20 MG/ML) 5 ML SYRINGE
INTRAMUSCULAR | Status: DC | PRN
  Administered 2019-01-06: 50 mg via INTRAVENOUS
  Administered 2019-01-06: 30 mg via INTRAVENOUS

## 2019-01-06 MED ORDER — FENTANYL CITRATE (PF) 100 MCG/2ML IJ SOLN
INTRAMUSCULAR | Status: AC
  Filled 2019-01-06: qty 2

## 2019-01-06 MED ORDER — GLUCAGON HCL RDNA (DIAGNOSTIC) 1 MG IJ SOLR
INTRAMUSCULAR | Status: AC
  Filled 2019-01-06: qty 1

## 2019-01-06 MED ORDER — CIPROFLOXACIN IN D5W 400 MG/200ML IV SOLN
INTRAVENOUS | Status: AC
  Filled 2019-01-06: qty 200

## 2019-01-06 MED ORDER — FENTANYL CITRATE (PF) 100 MCG/2ML IJ SOLN
INTRAMUSCULAR | Status: DC | PRN
  Administered 2019-01-06: 50 ug via INTRAVENOUS
  Administered 2019-01-06 (×2): 25 ug via INTRAVENOUS

## 2019-01-06 MED ORDER — INDOMETHACIN 50 MG RE SUPP
RECTAL | Status: AC
  Filled 2019-01-06: qty 2

## 2019-01-06 NOTE — Anesthesia Preprocedure Evaluation (Signed)
Anesthesia Evaluation  Patient identified by MRN, date of birth, ID band Patient awake    Reviewed: Allergy & Precautions, H&P , NPO status , Patient's Chart, lab work & pertinent test results  Airway Mallampati: II   Neck ROM: full    Dental   Pulmonary asthma , sleep apnea , COPD,    breath sounds clear to auscultation       Cardiovascular hypertension, + CAD   Rhythm:regular Rate:Normal     Neuro/Psych  Headaches, PSYCHIATRIC DISORDERS Anxiety  Neuromuscular disease    GI/Hepatic GERD  ,  Endo/Other  diabetes, Type 2  Renal/GU      Musculoskeletal  (+) Arthritis , Fibromyalgia -  Abdominal   Peds  Hematology  (+) anemia ,   Anesthesia Other Findings   Reproductive/Obstetrics                             Anesthesia Physical Anesthesia Plan  ASA: III  Anesthesia Plan: General   Post-op Pain Management:    Induction: Intravenous  PONV Risk Score and Plan: 3 and Ondansetron, Dexamethasone and Treatment may vary due to age or medical condition  Airway Management Planned: Oral ETT  Additional Equipment:   Intra-op Plan:   Post-operative Plan: Extubation in OR  Informed Consent: I have reviewed the patients History and Physical, chart, labs and discussed the procedure including the risks, benefits and alternatives for the proposed anesthesia with the patient or authorized representative who has indicated his/her understanding and acceptance.       Plan Discussed with: CRNA, Anesthesiologist and Surgeon  Anesthesia Plan Comments:         Anesthesia Quick Evaluation

## 2019-01-06 NOTE — Progress Notes (Signed)
COMMUNITY PALLIATIVE CARE RN NOTE  PATIENT NAME: [name] DOB: 02/18/1936 MRN: [id number]  PRIMARY CARE PROVIDER: Lucille Passy, MD  RESPONSIBLE PARTY:  Acct ID - Guarantor Home Phone Work Phone Relationship Acct Type  [id number] [name]  Self P/F     [address], [place], [place] [address]   Covid-19 Pre-screening Negative  PLAN OF CARE and INTERVENTION:  1. ADVANCE CARE PLANNING/GOALS OF CARE: Goal is for patient to remain at home with her husband and avoid hospitalizations. She is a Full code. 2. PATIENT/CAREGIVER EDUCATION: Explained Palliative care services and [facility] program 3. DISEASE STATUS: Joint visit made with Palliative Care SW, Lynn Duffy. Met with patient and her husband, [name], in their home. Her husband had to leave visit early for an appointment. Patient is alert and oriented x 3. Pleasant and engaging. She denies pain at this time, but does report intermittent pain in her abdomen. Her current pain regimen is effective. She denies dyspnea and none noted throughout visit. She is ambulatory without assistive devices. She is able to provide all ADLs independently. She also is still able to cook/prepare meals. She feels that her appetite and fluid intake is adequate. Denies dysphagia. She does have occasional nausea, but denies vomiting. She is a diabetic and it is well controlled with her diet. She takes her blood sugar once daily in the am. She is continent of both bowel and bladder. She says that at times her stools are white in color. Educated that this is most likely due to the lack of bile and pancreatic cancer.  She has an appointment at the Cancer center to get her markings for radiation. She says the goal of radiation is to help with pain relief and shrink the tumor. She reports sleeping well during the night and will take a nap during the day. She feels that she is doing well at this time and has a positive attitude. She says that she takes one day at a  time. She is agreeable to future Palliative care visits. Will continue to monitor.   HISTORY OF PRESENT ILLNESS:  This is a 83 yo female who resides at home with her husband. Palliative care team asked to follow patient to assist with goals of care, symptom management and additional support. Will visit patient monthly and PRN.  CODE STATUS: Full Code ADVANCED DIRECTIVES: N MOST FORM: no PPS: 60%   PHYSICAL EXAM:   LUNGS: clear to auscultation  CARDIAC: Cor RRR EXTREMITIES: No edema SKIN: Exposed skin is dry and intact  NEURO: Alert and oriented x 3, pleasant mood, ambulatory   (Duration of visit and documentation 75 minutes)    Daryl Eastern, RN BSN

## 2019-01-06 NOTE — H&P (Signed)
GASTROENTEROLOGY PROCEDURE H&P NOTE   Primary Care Physician: Lucille Passy, MD  HPI: [name] is a 83 y.o. female who presents for EUS/ERCP.  Past Medical History:  Diagnosis Date  . Asthma   . Atrophic vaginitis   . Breast cancer, Left 12/20/2010   NO BLOOD PRESSURE CHECKS OR STICKS IN LEFT ARM  . CAD (coronary artery disease)    a. 01/19/2010 s/p CABG x 3, lima->lad, vg->diag, vg->om1;  b. 06/2011 :Lexi MV: EF 84%, No ischemia. c. 01/06/14 s/p negative nuclear stress test with EF >70%  . Cervical arthritis   . Colonic polyp 04-27-2009   tubular adenoma  . Diverticulosis of colon (without mention of hemorrhage)   . Fibromyalgia   . Gallstones   . GERD (gastroesophageal reflux disease)   . Glaucoma   . Headache(784.0)    a. frequently assocaited with high BPs  . Hiatal hernia   . Hypercholesterolemia   . Irritable bowel syndrome   . Labile hypertension   . Lymphedema    left arm  . Pancreatic cancer ([place])   . Pinched vertebral nerve   . Secondary diabetes ([place]) 12/03/2018   Past Surgical History:  Procedure Laterality Date  . [place] STUDY N/A 01/08/2016   Procedure: [place] STUDY;  Surgeon: Ladene Artist, MD;  Location: [facility] ENDOSCOPY;  Service: Endoscopy;  Laterality: N/A;  . BILIARY BRUSHING  11/28/2018   Procedure: BILIARY BRUSHING;  Surgeon: Gatha Mayer, MD;  Location[facility] ENDOSCOPY;  Service: Endoscopy;;  . BILIARY STENT PLACEMENT  11/28/2018   Procedure: BILIARY STENT PLACEMENT;  Surgeon: Gatha Mayer, MD;  Location: [facility] ENDOSCOPY;  Service: Endoscopy;;  . BREAST LUMPECTOMY Left 02/06/11  . CATARACT EXTRACTION, BILATERAL     bilateral caaract removal,  . COLONOSCOPY    . CORONARY ANGIOPLASTY WITH STENT PLACEMENT     Stent 2007  . CORONARY ARTERY BYPASS GRAFT    . ERCP N/A 11/28/2018   Procedure: ENDOSCOPIC RETROGRADE CHOLANGIOPANCREATOGRAPHY (ERCP);  Surgeon: Gatha Mayer, MD;  Location[facility] END[facility]COPY;  Service: Endoscopy;  Laterality:  N/A;  . ESOPHAGEAL MANOMETRY N/A 01/08/2016   Procedure: ESOPHAGEAL MANOMETRY (EM);  Surgeon: Ladene Artist, MD;  Location: [facility] ENDOSCOPY;  Service: Endoscopy;  Laterality: N/A;  . ESOPHAGOGASTRODUODENOSCOPY    . ESOPHAGOGASTRODUODENOSCOPY (EGD) WITH PROPOFOL N/A 12/10/2018   Procedure: ESOPHAGOGASTRODUODENOSCOPY (EGD) WITH PROPOFOL;  Surgeon: [name], MD;  Location: [facility] ENDOSCOPY;  Service: Endoscopy;  Laterality: N/A;  . EUS N/A 12/10/2018   Procedure: UPPER ENDOSCOPIC ULTRASOUND (EUS) RADIAL;  Surgeon: [name], MD;  Location: [facility] ENDOSCOPY;  Service: Endoscopy;  Laterality: N/A;  . FINE NEEDLE ASPIRATION N/A 12/10/2018   Procedure: FINE NEEDLE ASPIRATION (FNA) LINEAR;  Surgeon: [name], MD;  Location: [facility] ENDOSCOPY;  Service: Endoscopy;  Laterality: N/A;   Current Facility-Administered Medications  Medication Dose Route Frequency Provider Last Rate Last Dose  . 0.9 %  sodium chloride infusion   Intravenous Continuous Lucio Edward T, MD      . 0.9 %  sodium chloride infusion   Intravenous Continuous Lucio Edward T, MD      . 0.9 %  sodium chloride infusion   Intravenous Continuous Ladene Artist, MD      . ciprofloxacin (CIPRO) IVPB 400 mg  400 mg Intravenous Once Ladene Artist, MD      . indomethacin (INDOCIN) 50 MG suppository 100 mg  100 mg Rectal Once Ladene Artist, MD      .  lactated ringers infusion   Intravenous Continuous Mansouraty, Telford Nab., MD 20 mL/hr at 01/06/19 1221 1,000 mL at 01/06/19 1221   Allergies  Allergen Reactions  . Levaquin [Levofloxacin In D5w]     Elevated BP  . Choline Fenofibrate Other (See Comments)     pt states INTOL to Trilipix w/ "thigh burning"  . Hctz [Hydrochlorothiazide]     Causes hyponatremia  . Simvastatin Other (See Comments)     pt states INTOL to STATINS \\T \ refuses to restart  . Adhesive [Tape] Rash  . Bentyl [Dicyclomine Hcl] Other (See Comments)    "Made me feel weird and drained me"  . Ceclor  [Cefaclor] Rash  . Clarithromycin Rash  . Codeine Nausea Only  . Doxycycline Rash  . Hydrocodone     Nightmare after taking cough syrup w/hydrocodone  . Lisinopril Cough    Developed ACE cough...  . Penicillins Itching, Rash and Other (See Comments)    At injection site Did it involve swelling of the face/tongue/throat, SOB, or low BP? No Did it involve sudden or severe rash/hives, skin peeling, or any reaction on the inside of your mouth or nose? No Did you need to seek medical attention at a hospital or doctor's office? No When did it last happen?"It was a long time ago" If all above answers are "NO", may proceed with cephalosporin use.   . Tobramycin-Dexamethasone Rash   Family History  Problem Relation Age of Onset  . Stroke Mother   . Stroke Father   . Prostate cancer Father   . Breast cancer Sister   . Diabetes Sister   . Cancer Brother        gland cancer  . Heart disease Brother   . Colon cancer Neg Hx   . Stomach cancer Neg Hx   . Pancreatic cancer Neg Hx   . Kidney disease Neg Hx   . Liver disease Neg Hx    Social History   Socioeconomic History  . Marital status: Married    Spouse name: Gildardo Griffes. [place]  . Number of children: 2  . Years of education: 20  . Highest education level: Not on file  Occupational History  . Occupation: retired    Fish farm manager: [employer]  Social Needs  . Financial resource strain: Not on file  . Food insecurity    Worry: Not on file    Inability: Not on file  . Transportation needs    Medical: Not on file    Non-medical: Not on file  Tobacco Use  . Smoking status: Never Smoker  . Smokeless tobacco: Never Used  Substance and Sexual Activity  . Alcohol use: No    Alcohol/week: 0.0 standard drinks  . Drug use: No  . Sexual activity: Not Currently    Partners: Male    Birth control/protection: Post-menopausal  Lifestyle  . Physical activity    Days per week: Not on file    Minutes per session: Not on file   . Stress: Not on file  Relationships  . Social Herbalist on phone: Not on file    Gets together: Not on file    Attends religious service: Not on file    Active member of club or organization: Not on file    Attends meetings of clubs or organizations: Not on file    Relationship status: Not on file  . Intimate partner violence    Fear of current or ex partner: Not on file  Emotionally abused: Not on file    Physically abused: Not on file    Forced sexual activity: Not on file  Other Topics Concern  . Not on file  Social History Narrative   Patient lives at home with her husband Marcello Moores). 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    Caffeine- sometimes- One cup of coffee.   Right handed.   Two daughters   Four granddaughters.   No EtOH, Tobacco, drugs    Physical Exam: Vital signs in last 24 hours: Temp:  [98 F (36.7 C)] 98 F (36.7 C) (11/11 1210) Pulse Rate:  [69] 69 (11/11 1210) Resp:  [18] 18 (11/11 1210) BP: (145)/(51) 145/51 (11/11 1210) SpO2:  [97 %] 97 % (11/11 1210) Weight:  [58 kg] 58 kg (11/11 1210)   GEN: NAD EYE: Sclerae anicteric ENT: MMM CV: Non-tachycardic GI: Soft, NT/ND NEURO:  Alert & Oriented x 3  Lab Results: Recent Labs    01/04/19 1108  WBC 7.7  HGB 10.3*  HCT 29.6*  PLT 398   BMET Recent Labs    01/04/19 1108  NA 134*  K 3.1*  CL 95*  CO2 27  GLUCOSE 170*  BUN 9  CREATININE 0.72  CALCIUM 9.3   LFT Recent Labs    01/04/19 1108  PROT 7.1  ALBUMIN 3.1*  AST 43*  ALT 88*  ALKPHOS 411*  BILITOT 8.0*   PT/INR No results for input(s): LABPROT, INR in the last 72 hours.   Impression / Plan: This is a 83 y.o.female who presents for EUS/ERCP.  The risks and benefits of endoscopic evaluation were discussed with the patient; these include but are not limited to the risk of perforation, infection, bleeding, missed lesions, lack of diagnosis, severe illness requiring  hospitalization, as well as anesthesia and sedation related illnesses.  The patient is agreeable to proceed.    Justice Britain, MD [facility] Advanced Endoscopy Office # [phone]

## 2019-01-06 NOTE — Op Note (Signed)
[facility] Patient Name: [name] Procedure Date: 01/06/2019 MRN: [id number] Attending MD: Justice Britain , MD Date of Birth: Oct 06, 1935 CSN: [id number] Age: 83 Admit Type: Outpatient Procedure:                Upper EUS Indications:              Pancreatic adenocarcinoma, Fiducial Marker                            placement prior to SBRT Providers:                Justice Britain, MD, Burtis Junes, RN, Elspeth Cho Tech., Technician, Glenis Smoker, CRNA Referring MD:             Pricilla Riffle. Fuller Plan, MD, Blair Promise, Dr. Lorenso Courier,                            Dr. Ardis Hughs Medicines:                General Anesthesia Complications:            No immediate complications. Estimated Blood Loss:     Estimated blood loss was minimal. Procedure:                Pre-Anesthesia Assessment:                           - Prior to the procedure, a History and Physical                            was performed, and patient medications and                            allergies were reviewed. The patient's tolerance of                            previous anesthesia was also reviewed. The risks                            and benefits of the procedure and the sedation                            options and risks were discussed with the patient.                            All questions were answered, and informed consent                            was obtained. Prior Anticoagulants: The patient has                            taken no previous anticoagulant or antiplatelet  agents except for aspirin. ASA Grade Assessment:                            III - A patient with severe systemic disease. After                            reviewing the risks and benefits, the patient was                            deemed in satisfactory condition to undergo the                            procedure.                           After obtaining  informed consent, the endoscope was                            passed under direct vision. Throughout the                            procedure, the patient's blood pressure, pulse, and                            oxygen saturations were monitored continuously. The                            [id number] Olympus gastroscope was                            introduced through the mouth, and advanced to the                            second part of duodenum. The [id number]                            Olympus Linear EUS was introduced through the                            mouth, and advanced to the duodenum for ultrasound                            examination from the stomach and duodenum. The                            upper EUS was accomplished without difficulty. The                            patient tolerated the procedure. Scope In: Scope Out: Findings:      ENDOSCOPIC FINDING: :      No gross lesions were noted in the entire esophagus.      The Z-line was regular.      No gross lesions were noted in the entire examined stomach.      Small  non-bleeding diverticulae were found in the second portion of the       duodenum, in the area of the papilla and in the third portion of the       duodenum.      A previously placed plastic biliary stent was seen at the major papilla.       Removal of a stent was accomplished with a rat-toothed forceps and snare.      There was evidence of a patent small sphincterotomy in the major papilla.      ENDOSONOGRAPHIC FINDING: :      An irregular mass was identified in the pancreatic head. The mass was       hypoechoic. The mass measured 25 mm by 22 mm in maximal cross-sectional       diameter. The endosonographic borders were poorly-defined. The remainder       of the pancreas was examined. The endosonographic appearance of       parenchyma and the upstream pancreatic duct indicated duct dilation and       parenchymal atrophy. Significant  vessel involvement was noted as on       prior EUS. Small window allowed [place] to proceed with attempt at fiducial       marker placement. Once the target lesion in the pancreatic head was       identified a preloaded marker in a 22 gauge Sharkcore needle was then       deployed in and around the lesion. This was repeated for a total of four       markers.      There was dilation in the common bile duct with evidence of sludge.      Moderate hyperechoic material consistent with sludge was visualized       endosonographically in the common bile duct and in the gallbladder body.      A small amount of fluid, visualized as an anechoic feature, was found in       the peritoneal cavity.      Endosonographic imaging in the visualized portion of the liver showed no       mass.      The celiac region was visualized. Impression:               EGD Impression:                           - No gross lesions in esophagus. Z-line regular.                           - No gross lesions in the stomach.                           - Non-bleeding duodenal diverticulae noted.                           - Plastic biliary stent in the duodenum. Removed.                           - Patent sphincterotomy was found.                           EUS Impression:                           -  A mass was identified in the pancreatic head. A                            tissue diagnosis was obtained prior to this exam.                            This is of adenocarcinoma. Fiducial markers were                            deployed in a tight window.                           - There was dilation in the common bile duct.                           - Hyperechoic material consistent with sludge was                            visualized endosonographically in the common bile                            duct and in the gallbladder body.                           - Ascites was found on endosonographic examination                             of the peritoneal cavity. Moderate Sedation:      Not Applicable - Patient had care per Anesthesia. Recommendation:           - Proceed to scheduled ERCP.                           - The findings and recommendations were discussed                            with the patient.                           - The findings and recommendations were discussed                            with the patient's family. Procedure Code(s):        --- Professional ---                           [phone], Esophagogastroduodenoscopy, flexible,                            transoral; with transendoscopic ultrasound-guided                            transmural injection of diagnostic or therapeutic                            substance(s) (  eg, anesthetic, neurolytic agent) or                            fiducial marker(s) (includes endoscopic ultrasound                            examination of the esophagus, stomach, and either                            the duodenum or a surgically altered stomach where                            the jejunum is examined distal to the anastomosis)                           [id number], Esophagogastroduodenoscopy, flexible,                            transoral; with removal of foreign body(s) Diagnosis Code(s):        --- Professional ---                           [id number], Other specified postprocedural states                           Z46.59, Encounter for fitting and adjustment of                            other gastrointestinal appliance and device                           K86.89, Other specified diseases of pancreas                           K83.8, Other specified diseases of biliary tract                           C25.9, Malignant neoplasm of pancreas, unspecified                           K57.10, Diverticulosis of small intestine without                            perforation or abscess without bleeding CPT copyright 2019 American Medical Association. All rights reserved. The codes  documented in this report are preliminary and upon coder review may  be revised to meet current compliance requirements. Justice Britain, MD 01/06/2019 3:27:57 PM Number of Addenda: 0

## 2019-01-06 NOTE — Discharge Instructions (Signed)
YOU HAD AN ENDOSCOPIC PROCEDURE TODAY: Refer to the procedure report and other information in the discharge instructions given to you for any specific questions about what was found during the examination. If this information does not answer your questions, please call [facility] office at [phone] to clarify.  ° °YOU SHOULD EXPECT: Some feelings of bloating in the abdomen. Passage of more gas than usual. Walking can help get rid of the air that was put into your GI tract during the procedure and reduce the bloating. If you had a lower endoscopy (such as a colonoscopy or flexible sigmoidoscopy) you may notice spotting of blood in your stool or on the toilet paper. Some abdominal soreness may be present for a day or two, also. ° °DIET: Your first meal following the procedure should be a light meal and then it is ok to progress to your normal diet. A half-sandwich or bowl of soup is an example of a good first meal. Heavy or fried foods are harder to digest and may make you feel nauseous or bloated. Drink plenty of fluids but you should avoid alcoholic beverages for 24 hours. If you had a esophageal dilation, please see attached instructions for diet.   ° °ACTIVITY: Your care partner should take you home directly after the procedure. You should plan to take it easy, moving slowly for the rest of the day. You can resume normal activity the day after the procedure however YOU SHOULD NOT DRIVE, use power tools, machinery or perform tasks that involve climbing or major physical exertion for 24 hours (because of the sedation medicines used during the test).  ° °SYMPTOMS TO REPORT IMMEDIATELY: °A gastroenterologist can be reached at any hour. Please call [phone]  for any of the following symptoms:  °Following lower endoscopy (colonoscopy, flexible sigmoidoscopy) °Excessive amounts of blood in the stool  °Significant tenderness, worsening of abdominal pains  °Swelling of the abdomen that is new, acute  °Fever of 100° or  higher  °Following upper endoscopy (EGD, EUS, ERCP, esophageal dilation) °Vomiting of blood or coffee ground material  °New, significant abdominal pain  °New, significant chest pain or pain under the shoulder blades  °Painful or persistently difficult swallowing  °New shortness of breath  °Black, tarry-looking or red, bloody stools ° °FOLLOW UP:  °If any biopsies were taken you will be contacted by phone or by letter within the next 1-3 weeks. Call [phone]  if you have not heard about the biopsies in 3 weeks.  °Please also call with any specific questions about appointments or follow up tests. ° °

## 2019-01-06 NOTE — Transfer of Care (Signed)
Immediate Anesthesia Transfer of Care Note  Patient: [name]  Procedure(s) Performed: ENDOSCOPIC RETROGRADE CHOLANGIOPANCREATOGRAPHY (ERCP) WITH PROPOFOL (N/A ) FULL UPPER ENDOSCOPIC ULTRASOUND (EUS) RADIAL (N/A ) FIDUCIAL MARKER PLACEMENT STENT REMOVAL BILIARY STENT PLACEMENT (N/A ) BILIARY DILATION ESOPHAGOGASTRODUODENOSCOPY (EGD) (N/A ) SPHINCTEROTOMY  Patient Location: PACU and Endoscopy Unit  Anesthesia Type:General  Level of Consciousness: awake and alert   Airway & Oxygen Therapy: Patient Spontanous Breathing and Patient connected to face mask oxygen  Post-op Assessment: Report given to RN and Post -op Vital signs reviewed and stable  Post vital signs: Reviewed and stable  Last Vitals:  Vitals Value Taken Time  BP 157/54 01/06/19 1510  Temp    Pulse 66 01/06/19 1512  Resp 15 01/06/19 1512  SpO2 100 % 01/06/19 1512  Vitals shown include unvalidated device data.  Last Pain:  Vitals:   01/06/19 1510  TempSrc:   PainSc: 0-No pain         Complications: No apparent anesthesia complications

## 2019-01-06 NOTE — Telephone Encounter (Signed)
Called [name] to touch base after consult with [facility] team members. They will touch base with her next month to assess for needs. She understands that she can call them sooner if she has questions or concerns. Patient scheduled for endoscopic procedures with Dr. Rush Landmark today. Patient voiced her concerns about her husband, of 51 years, and how he processing her terminal illness. She endorses a strong social network that is supportive and adds that her faith in God is what is getting her through. Will contact [place]- for additional support. She is appreciative of the care she is receiving from providers and staff. Will continue to follow as needed.

## 2019-01-06 NOTE — Telephone Encounter (Signed)
Ladene Artist, MD sent to [place], Telford Nab., MD; Marlon Pel, RN        Lequisha Cammack,  Please set up HFP in lab in 1 week, an REV with me or APP in 4-6 weeks and please cancel any future ERCPs that are scheduled.  Thanks,  MS    Labs ordered for 1 week, ERCP cancelled for 02/15/19 Left message for patient to call back

## 2019-01-06 NOTE — Op Note (Signed)
[facility] Patient Name: [name] Procedure Date: 01/06/2019 MRN: [id number] Attending MD: Justice Britain , MD Date of Birth: 1935-08-03 CSN: [id number] Age: 83 Admit Type: Outpatient Procedure:                ERCP Indications:              Malignant stricture of the common bile duct,                            Jaundice, Abnormal liver function test, Malignant                            tumor of the head of pancreas, Stent change Providers:                Justice Britain, MD, Burtis Junes, RN, Elspeth Cho Tech., Technician, Glenis Smoker, CRNA Referring MD:             Blair Promise, Dr. Lorenso Courier, Dr. Fuller Plan, Dr. Ardis Hughs Medicines:                General Anesthesia, Indomethacin 100 mg PR,                            Glucagon 0.5 mg IV, Cipro 335 mg IV Complications:            No immediate complications. Estimated Blood Loss:     Estimated blood loss was minimal. Procedure:                Pre-Anesthesia Assessment:                           - Prior to the procedure, a History and Physical                            was performed, and patient medications and                            allergies were reviewed. The patient's tolerance of                            previous anesthesia was also reviewed. The risks                            and benefits of the procedure and the sedation                            options and risks were discussed with the patient.                            All questions were answered, and informed consent                            was obtained. Prior Anticoagulants: The patient has  taken no previous anticoagulant or antiplatelet                            agents except for aspirin. ASA Grade Assessment:                            III - A patient with severe systemic disease. After                            reviewing the risks and benefits, the patient was               deemed in satisfactory condition to undergo the                            procedure.                           After obtaining informed consent, the scope was                            passed under direct vision. Throughout the                            procedure, the patient's blood pressure, pulse, and                            oxygen saturations were monitored continuously. The                            TJF-Q180V ([id number]) Olympus duodenoscope was                            introduced through the mouth, and used to inject                            contrast into and used to inject contrast into the                            bile duct. The ERCP was accomplished without                            difficulty. The patient tolerated the procedure. Scope In: Scope Out: Findings:      The scout film was normal as previous biliary stent was removed at time       of EUS.      The esophagus was successfully intubated under direct vision without       detailed examination of the pharynx, larynx, and associated structures,       and upper GI tract. The major papilla was adjacent to a diverticulum. A       biliary sphincterotomy had been performed. The sphincterotomy appeared       open.      A short 0.035 inch Soft Jagwire was passed into the biliary tree. The       Autotome sphincterotome was passed over the guidewire and the bile duct  was then deeply cannulated. Contrast was injected. I personally       interpreted the bile duct images. Ductal flow of contrast was adequate.       Image quality was adequate. Contrast extended to the hepatic ducts.       Opacification of the entire biliary tree except for the cystic duct and       gallbladder was successful. The lower third of the main duct contained a       single severe stenosis 25 mm in length. The middle third of the main       bile duct, upper third of the main bile duct and left and right hepatic       ducts and all  intrahepatic branches were diffusely dilated, secondary to       a stricture. The largest diameter was 18 mm. The middle third of the       main bile duct contained filling defects thought to be sludge. The       biliary sphincterotomy was extended to a total of 8 mm in length with a       monofilament Autotome sphincterotome using ERBE electrocautery. There       was no post-sphincterotomy bleeding. To discover objects, the biliary       tree was swept with a retrieval balloon starting at the bifurcation. Pus       was swept from the duct. Sludge was swept from the duct. An occlusion       cholangiogram was performed that showed no further significant biliary       pathology. One [place] 10 mm by 6 cm uncovered metal biliary stent was       placed into the common bile duct. Bile and pus flowed through the stent.       The stent was in good position. Dilation of the stent within the CBD to       aid in expansion was performed with an 10-03-08 mm balloon (to a maximum       balloon size of 9 mm) dilator was successful. Complete removal of all       biliary contrast was noted at completion of this maneuver.      A pancreatogram was not performed.      The duodenoscope was withdrawn from the patient. Impression:               - The major papilla was adjacent to a diverticulum.                            Prior biliary sphincterotomy appeared open.                           - The fluroscopic examination was suspicious for                            sludge.                           - A single severe biliary stricture was found in                            the lower third of the main bile duct. The  stricture was malignant appearing.                           - The upper third of the main bile duct, middle                            third of the main bile duct and left and right                            hepatic ducts and all intrahepatic branches were                             dilated, secondary to aforementioned stricture.                           - A biliary sphincterotomy extension was performed.                           - The biliary tree was swept and pus and sludge                            were found.                           - One uncovered metal biliary stent was placed into                            the common bile duct. The stent was dilated to aid                            in expansion. Moderate Sedation:      Not Applicable - Patient had care per Anesthesia. Recommendation:           - The patient will be observed post-procedure,                            until all discharge criteria are met.                           - Discharge patient to home.                           - Patient has a contact number available for                            emergencies. The signs and symptoms of potential                            delayed complications were discussed with the                            patient. Return to normal activities tomorrow.  Written discharge instructions were provided to the                            patient.                           - Observe patient's clinical course.                           - Watch for pancreatitis, bleeding, perforation,                            and cholangitis.                           - Ciprofloxacin 500 mg BID x 3-days to aid and                            decrease risk of post-ERCP infectious complications                            but with open stent, things moved well through the                            biliary tree.                           - Check liver enzymes (AST, ALT, alkaline                            phosphatase, bilirubin) in 1 week.                           - Follow up with Oncology & Radiation Oncology for                            next steps in your therapy.                           - The findings and recommendations were discussed                             with the patient.                           - The findings and recommendations were discussed                            with the patient's family. Procedure Code(s):        --- Professional ---                           [id number], Endoscopic retrograde                            cholangiopancreatography (ERCP); with placement of  endoscopic stent into biliary or pancreatic duct,                            including pre- and post-dilation and guide wire                            passage, when performed, including sphincterotomy,                            when performed, each stent                           [id number], Endoscopic retrograde                            cholangiopancreatography (ERCP); with removal of                            calculi/debris from biliary/pancreatic duct(s) Diagnosis Code(s):        --- Professional ---                           K83.1, Obstruction of bile duct                           R17, Unspecified jaundice                           R94.5, Abnormal results of liver function studies                           C25.0, Malignant neoplasm of head of pancreas                           Z46.59, Encounter for fitting and adjustment of                            other gastrointestinal appliance and device CPT copyright 2019 American Medical Association. All rights reserved. The codes documented in this report are preliminary and upon coder review may  be revised to meet current compliance requirements. Justice Britain, MD 01/06/2019 3:36:42 PM Number of Addenda: 0

## 2019-01-06 NOTE — Anesthesia Procedure Notes (Signed)
Date/Time: 01/06/2019 3:03 PM Performed by: Cynda Familia, CRNA Oxygen Delivery Method: Simple face mask Placement Confirmation: positive ETCO2 and breath sounds checked- equal and bilateral Dental Injury: Teeth and Oropharynx as per pre-operative assessment

## 2019-01-06 NOTE — Anesthesia Procedure Notes (Signed)
Procedure Name: Intubation Date/Time: 01/06/2019 1:22 PM Performed by: Cynda Familia, CRNA Pre-anesthesia Checklist: Patient identified, Emergency Drugs available, Suction available and Patient being monitored Patient Re-evaluated:Patient Re-evaluated prior to induction Oxygen Delivery Method: Circle System Utilized Preoxygenation: Pre-oxygenation with 100% oxygen Induction Type: IV induction, Rapid sequence and Cricoid Pressure applied Ventilation: Mask ventilation without difficulty Laryngoscope Size: Miller and 2 Grade View: Grade I Tube type: Oral Number of attempts: 1 Airway Equipment and Method: Stylet Placement Confirmation: ETT inserted through vocal cords under direct vision,  positive ETCO2 and breath sounds checked- equal and bilateral Secured at: 21 cm Tube secured with: Tape Dental Injury: Teeth and Oropharynx as per pre-operative assessment  Comments: Smooth RSI- [place]  Intubation AM CRNA atraumatic-- teeth and mouth as preop  bilat BS

## 2019-01-07 ENCOUNTER — Encounter (HOSPITAL_COMMUNITY): Payer: Self-pay | Admitting: Gastroenterology

## 2019-01-07 ENCOUNTER — Encounter: Payer: Self-pay | Admitting: General Practice

## 2019-01-07 NOTE — Telephone Encounter (Signed)
Patient has follow up on 02/03/19 with Dr. Fuller Plan and she will come for labs next Wed

## 2019-01-07 NOTE — Anesthesia Postprocedure Evaluation (Signed)
Anesthesia Post Note  Patient: [name]  Procedure(s) Performed: ENDOSCOPIC RETROGRADE CHOLANGIOPANCREATOGRAPHY (ERCP) WITH PROPOFOL (N/A ) FULL UPPER ENDOSCOPIC ULTRASOUND (EUS) RADIAL (N/A ) FIDUCIAL MARKER PLACEMENT STENT REMOVAL BILIARY STENT PLACEMENT (N/A ) BILIARY DILATION ESOPHAGOGASTRODUODENOSCOPY (EGD) (N/A ) SPHINCTEROTOMY     Patient location during evaluation: Endoscopy Anesthesia Type: General Level of consciousness: awake and alert Pain management: pain level controlled Vital Signs Assessment: post-procedure vital signs reviewed and stable Respiratory status: spontaneous breathing, nonlabored ventilation, respiratory function stable and patient connected to nasal cannula oxygen Cardiovascular status: blood pressure returned to baseline and stable Postop Assessment: no apparent nausea or vomiting Anesthetic complications: no    Last Vitals:  Vitals:   01/06/19 1540 01/06/19 1550  BP: (!) 154/52 (!) 155/60  Pulse: 65 66  Resp: 14 15  Temp:  36.7 C  SpO2: 100% 94%    Last Pain:  Vitals:   01/07/19 1004  TempSrc:   PainSc: [place]

## 2019-01-07 NOTE — Progress Notes (Signed)
[place] Spiritual Care Note  Referred by [facility]/RN, GI navigator, for additional empathic listening and spiritual/emotional support. 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 All of these layers of support together put her in upbeat and grateful spirits. She has my direct dial number and knows to contact me anytime if future need/desire arises.  I will plan to reach out again in a couple weeks, but please also page if needs arise or circumstances change. Thank you.   [place], [place], [facility] Pager [phone] Voicemail [phone]

## 2019-01-08 ENCOUNTER — Telehealth: Payer: Self-pay | Admitting: Gastroenterology

## 2019-01-08 NOTE — Telephone Encounter (Signed)
Thank you for the update. I called the patient and the patient's daughter on separate occasions this afternoon after hearing about this. After speaking with the patient she states that she actually did well for approximately 16 hours after her procedure however yesterday she started to have inability to remain sleep start developing some insomnia she started developing some discomfort in the midportion of her abdomen that was also radiating at times into her back.  Interestingly, she has been able to eat and drink relatively well.  She drank at least 80 ounces of fluid yesterday.  She notices her urine is less dark than yesterday.  She has been able to eat and drink today although has not been as much she was able to have enough fluids and does not feel dehydrated at all.  She is still urinating.  Patient initially took tramadol 50 mg with minimal effect.  A few hours later she then took 1300 mg of Tylenol which was effective.  Tylenol 500 mg was administered by the patient at 1230 and this helped.  She is remained without a fever throughout this period in time.  She is more fatigued but feels this is a result of her not getting great sleep yesterday.  Her bowel movements are brown without any evidence of melena or hematochezia or maroon stools. After talking with the patient I do think that there is some concern for the possibility of post ERCP pancreatitis versus the possibility of post ERCP biliary stent dilation of the previous ductal stricture versus other potential complications. We discussed trying to get her under control and remain at home as long as she is eating and drinking appropriately and she would like to try and do that.  I think it is reasonable to try.  She is going to take 500 mg of Tylenol now at 5 PM, she would then take 50 mg of tramadol around 7 PM and then around 9 PM she will take another 500 mg of Tylenol.  My hope is that we can calm her effects from this process and she is still  eating and drinking and get her some rest. After speaking with the patient she would like to try this as she wants to try and minimize exposure in the hospital or emergency department. She and her daughter both understand and have my understanding that there needs to be a very low threshold for her if she is not improving with these outpatient medical regimens for her to come in to be further evaluated and potentially ensure that she does not have post ERCP pancreatitis or complication post procedure.  Since she is able to eat and drink I suspect this is more a result of the biliary stent fully expanding but we obviously have to be mindful and thoughtful. Hopefully by tomorrow she will be able to stagger Tylenol and tramadol at a more extensive period in time.  The following are my thresholds for her to come in to the emergency department: 1) progressive pain not being tolerated with oral medications as outlined above 2) inability to tolerate oral intake with significant nausea and vomiting developing and becoming dehydrated 3) progressive insomnia due to inability to tolerate sleep because of pain or discomfort 4) progressive fatigue and/or presyncope/syncope 5) change in bowel habits having dark black or maroon stools  The patient and daughter appreciative for our discussion today and hopeful that she can stay at home but they understand that if things progress they should have a low threshold  to call our office to let our covering provider know (Dr. Carol Ada over the weekend) and to come to the ED for fluids/hydration/pain control and rule out other complications.  I will send a message to Dr. Benson Norway as well as to the patient's primary gastroenterologist Dr. Fuller Plan and to RN Ronnald Ramp.   Justice Britain, MD [facility] # [phone]

## 2019-01-08 NOTE — Telephone Encounter (Signed)
Patient states she is hurting in her stomach and her back. She said since her procedure on Wednesday she has not felt okay.

## 2019-01-11 NOTE — Telephone Encounter (Signed)
She reports that the pain had resolved, but is having a little pain today.  She just took some Tylenol.  She is eating and drinking well.  " had homemade chicken noodle soup today"  .  She reports that she is "just a little droopy".  She is in good spirits.  She is doing some light house work and is tolerating that.  She will come for the ordered labs on Wed

## 2019-01-11 NOTE — Telephone Encounter (Signed)
See GM's note. Please call to check on her today.

## 2019-01-11 NOTE — Telephone Encounter (Signed)
Continue current mgmt and will review her labs on Wednesday.

## 2019-01-13 ENCOUNTER — Other Ambulatory Visit (INDEPENDENT_AMBULATORY_CARE_PROVIDER_SITE_OTHER): Payer: Medicare Other

## 2019-01-13 DIAGNOSIS — C250 Malignant neoplasm of head of pancreas: Secondary | ICD-10-CM | POA: Diagnosis not present

## 2019-01-13 LAB — HEPATIC FUNCTION PANEL
ALT: 41 U/L — ABNORMAL HIGH (ref 0–35)
AST: 25 U/L (ref 0–37)
Albumin: 3.8 g/dL (ref 3.5–5.2)
Alkaline Phosphatase: 219 U/L — ABNORMAL HIGH (ref 39–117)
Bilirubin, Direct: 1 mg/dL — ABNORMAL HIGH (ref 0.0–0.3)
Total Bilirubin: 2 mg/dL — ABNORMAL HIGH (ref 0.2–1.2)
Total Protein: 7.1 g/dL (ref 6.0–8.3)

## 2019-01-15 ENCOUNTER — Telehealth: Payer: Self-pay | Admitting: *Deleted

## 2019-01-15 ENCOUNTER — Telehealth: Payer: Self-pay

## 2019-01-15 NOTE — Telephone Encounter (Signed)
Contacted pt's daughter to convey this RN would have CT Sim schedule pt for CT Sim at next available. Daughter requested CT Sim staff contact pt. Daughter verbalized understanding and agreement. Loma Sousa, RN BSN

## 2019-01-15 NOTE — Telephone Encounter (Signed)
Received call from patient's daughter, [name].  She had questions about her mother's pain and pain management. Pt has had chronic back pain for 40 years but now with pancreatic cancer, biliary stents etc, she has increased back pain at times.  Reviewed pain meds (Tylenol and Tramadol) Advised to try to alternate these so that her mother has something to take every 4 hours or so.  Tramadol is every 8 hours and tylenol can be every 6 hours.  Also advised to call back if these measures do not control pain. Also discussed the palliative nature of treatment plan, not curative.  [name] voiced understanding and will reinforce this with her mother.  They have not heard back from Radiation Oncology yet about radiation treatments.  Pt had markers placed when her biliary stent was replaced on 01/06/19  This message forwarded to Dr. Clabe Seal nurse.

## 2019-01-18 ENCOUNTER — Other Ambulatory Visit: Payer: Self-pay | Admitting: Cardiovascular Disease

## 2019-01-18 MED ORDER — METOPROLOL TARTRATE 50 MG PO TABS: 50.0000 mg | ORAL_TABLET | Freq: Three times a day (TID) | ORAL | 3 refills | Status: DC

## 2019-01-20 ENCOUNTER — Encounter: Payer: Self-pay | Admitting: General Practice

## 2019-01-20 NOTE — Progress Notes (Signed)
[place] Spiritual Care Note  Reached Ms Eshleman by phone for follow-up support and encouragement. She was in good spirits, feeling "grateful and blessed" because she is receiving so much love and support from friends, daughters, and her four granddaughters. "Folks just want to come and do" for her, and she feels the caring in every gesture. We plan to follow up by phone in ca two weeks for another pastoral check-in.   [place], [place], [facility] Pager [phone] Voicemail [phone]

## 2019-01-25 ENCOUNTER — Other Ambulatory Visit: Payer: Self-pay

## 2019-01-25 ENCOUNTER — Ambulatory Visit
Admission: RE | Admit: 2019-01-25 | Discharge: 2019-01-25 | Disposition: A | Discharge status: Home / Self Care | Payer: Medicare Other | Source: Ambulatory Visit | Attending: Radiation Oncology | Admitting: Radiation Oncology

## 2019-01-25 ENCOUNTER — Other Ambulatory Visit: Payer: Self-pay | Admitting: *Deleted

## 2019-01-25 VITALS — BP 142/56 | HR 65 | Temp 97.6°F | Resp 20 | Wt 125.6 lb

## 2019-01-25 DIAGNOSIS — C250 Malignant neoplasm of head of pancreas: Secondary | ICD-10-CM | POA: Diagnosis not present

## 2019-01-25 NOTE — Progress Notes (Signed)
  Radiation Oncology         [phone] ________________________________  Name: [name] MRN: [id number]  Date: 01/25/2019  DOB: 1935/07/15   STEREOTACTIC BODY RADIOTHERAPY SIMULATION AND TREATMENT PLANNING NOTE    DIAGNOSIS: Adenocarcinoma of the pancreatic head, uT2N1  NARRATIVE:  The patient was brought to the [facility].  Identity was confirmed.  All relevant records and images related to the planned course of therapy were reviewed.  The patient freely provided informed written consent to proceed with treatment after reviewing the details related to the planned course of therapy. The consent form was witnessed and verified by the simulation staff.  Then, the patient was set-up in a stable reproducible  supine position for radiation therapy.  A BodyFix immobilization pillow was fabricated for reproducible positioning.  Then I personally applied the abdominal compression paddle to limit respiratory excursion.  4D respiratoy motion management CT images were obtained.  Surface markings were placed.  The CT images were loaded into the planning software.  Then, using Cine, MIP, and standard views, the internal target volume (ITV) and planning target volumes (PTV) were delinieated, and avoidance structures were contoured.  Treatment planning then occurred.  The radiation prescription was entered and confirmed.  A total of two complex treatment devices were fabricated in the form of the BodyFix immobilization pillow and a neck accuform cushion.  I have requested : 3D Simulation  I have requested a DVH of the following structures:GTV, PTV, small bowel, stomach, liver, spinal cord.  PLAN:  The patient will receive 33 Gy in 5 fractions (6.6 Gy/fraction) using stereotactic body radiation therapy techniques.  -----------------------------------  Blair Promise, PhD, MD  This document serves as a record of services personally performed by Gery Pray, MD. It was created on  his behalf by Clerance Lav, a trained medical scribe. The creation of this record is based on the scribe's personal observations and the provider's statements to them. This document has been checked and approved by the attending provider.

## 2019-01-25 NOTE — Progress Notes (Signed)
Has armband been applied?  Yes.    Does patient have an allergy to IV contrast dye?: No.   Has patient ever received premedication for IV contrast dye?: No.   Does patient take metformin?: No.  If patient does take metformin when was the last dose: N/A  Date of lab work: 01/04/2019 1108 BUN: 9 CR: 0.72  IV site: forearm right, condition patent and no redness  Has IV site been added to flowsheet?  Yes.    LMP 11/26/1990   See CT Simulation encounter for VS.   Loma Sousa, RN BSN

## 2019-01-27 ENCOUNTER — Ambulatory Visit: Payer: Medicare Other | Admitting: Certified Nurse Midwife

## 2019-01-28 ENCOUNTER — Telehealth: Payer: Self-pay | Admitting: Hematology and Oncology

## 2019-01-28 ENCOUNTER — Telehealth: Payer: Self-pay | Admitting: Licensed Clinical Social Worker

## 2019-01-28 DIAGNOSIS — U071 COVID-19: Secondary | ICD-10-CM | POA: Diagnosis not present

## 2019-01-28 NOTE — Telephone Encounter (Signed)
Patient returned phone call regarding voicemail that was left, per patient's request 12/04 appointment ha been cancelled. Patient will need to call back when ready to reschedule.

## 2019-01-28 NOTE — Telephone Encounter (Signed)
Palliative Care SW left a vm with patient to schedule a home visit. 

## 2019-01-28 NOTE — Telephone Encounter (Signed)
Returned patient's phone call regarding rescheduling an appointment, left a voicemail. 

## 2019-01-29 ENCOUNTER — Inpatient Hospital Stay: Payer: Medicare Other | Admitting: Hematology and Oncology

## 2019-01-29 ENCOUNTER — Inpatient Hospital Stay: Payer: Medicare Other

## 2019-01-29 ENCOUNTER — Telehealth: Payer: Self-pay

## 2019-01-29 ENCOUNTER — Other Ambulatory Visit: Payer: Self-pay

## 2019-01-29 ENCOUNTER — Telehealth: Payer: Self-pay | Admitting: *Deleted

## 2019-01-29 ENCOUNTER — Telehealth: Payer: Self-pay | Admitting: Family Medicine

## 2019-01-29 DIAGNOSIS — K8689 Other specified diseases of pancreas: Secondary | ICD-10-CM

## 2019-01-29 NOTE — Telephone Encounter (Signed)
Patient's daughter [name]) called and states her Mom tested positive for COVID yesterday. Also he husband is in the hospital with [facility]. She needs to cancel the office visit with Dr. Fuller Plan on 02/03/19 and will call back to reschedule when able

## 2019-01-29 NOTE — Telephone Encounter (Signed)
Spoke with patients daughter she and her husband have tested positive for covid daughter will call back in 14 days to reschedule.

## 2019-01-29 NOTE — Telephone Encounter (Signed)
Please reschedule her appt for at least 14 days after all covid-19 symptoms have resolved.

## 2019-01-29 NOTE — Telephone Encounter (Signed)
TCT patient home, spoke with daughter, [name].   Called to check on patient as she is unable to come to appt with Dr. Lorenso Courier today due to testing positive for Covid 19.  Pt's husband tested positive first and is currently in the hospital.  [name] only symptom at this point is some sinus drainage.  Otherwise she feels well. [name] states her mother has had more energy in the last few days. Denies any itching, jaundice or change in pain. She does c/o increase bloating/gas. Advised to to try OTC Gas-X/simethicone to see if that would help her gas complaints.  Pt denies fever, chills, SOB and she is eating well. Pt's radiation treatments have had to be postponed due to her + Covid test.  Res-scheduled to start on 02/23/19 [place] re-scheduling message for pt to be seen by Dr. Lorenso Courier the 1st week of January with labs

## 2019-01-29 NOTE — Telephone Encounter (Signed)
-----   Message from Hughie Closs, RN sent at 01/13/2019  2:36 PM EST ----- Pur order in Epic and call patient to come in for LFTs right before ov with Dr. Fuller Plan on 02/03/19

## 2019-01-29 NOTE — Telephone Encounter (Signed)
Left message to please call back. °

## 2019-01-29 NOTE — Telephone Encounter (Signed)
Pt's daughter [name] stated that the pt tested positive for Covid 19. She would like to know if there is any medication Dr. Deborra Medina can prescribe for pt. She does not have any symptoms other than drainage which daughter stated was not new. Pt would like to know if she should keep taking her allergy medication. Please advise. Okay to leave vm. [phone]

## 2019-02-01 ENCOUNTER — Other Ambulatory Visit: Payer: Medicare Other | Admitting: Licensed Clinical Social Worker

## 2019-02-01 ENCOUNTER — Other Ambulatory Visit: Payer: Self-pay

## 2019-02-01 DIAGNOSIS — Z515 Encounter for palliative care: Secondary | ICD-10-CM

## 2019-02-01 NOTE — Telephone Encounter (Signed)
I'm so sorry to hear this.  Is she having any respiratory symptoms at all?

## 2019-02-01 NOTE — Telephone Encounter (Signed)
Please see message and advise.  Thank you.  Pt is currently taking Allegra.

## 2019-02-02 ENCOUNTER — Encounter: Payer: Self-pay | Admitting: *Deleted

## 2019-02-02 ENCOUNTER — Ambulatory Visit: Payer: Medicare Other | Admitting: Radiation Oncology

## 2019-02-02 NOTE — Progress Notes (Signed)
COMMUNITY PALLIATIVE CARE SW NOTE  PATIENT NAME: [name] DOB: 05/23/1935 MRN: [id number]  PRIMARY CARE PROVIDER: Lucille Passy, MD  RESPONSIBLE PARTY:  Acct ID - Guarantor Home Phone Work Phone Relationship Acct Type  [id number] [name]  Self P/F     [employer], [place], [place] [address]   Due to the COVID-19 crisis, this virtual check-in visit was done via telephone from my office and it was initiated and consent by this patientand orfamily.    PLAN OF CARE and INTERVENTIONS:             1. GOALS OF CARE/ ADVANCE CARE PLANNING:  Goal is for patient not to be hospitalized.  Patient is a full code. 2. SOCIAL/EMOTIONAL/SPIRITUAL ASSESSMENT/ INTERVENTIONS:  SW conducted a Sales executive visit with patient's daughter, [name].  Patient's husband is in ICU at [facility] with COVID-19.  Patient has tested positive for COVID-19 also, but is only displaying allergy-like symptoms.  [name] is now staying with patient 24/7.  Patient's cancer treatments are currently on hold.  [name] reports patient as being strong.  The family has extensive support. 3. PATIENT/CAREGIVER EDUCATION/ COPING:  Daughter copes by problem-solving. 4. PERSONAL EMERGENCY PLAN:  Patient will rest when she becomes tired.  Family will contact EMS if needed. 5. COMMUNITY RESOURCES COORDINATION/ HEALTH CARE NAVIGATION:  None. 6. FINANCIAL/LEGAL CONCERNS/INTERVENTIONS:  None.     SOCIAL HX:  Social History   Tobacco Use  . Smoking status: Never Smoker  . Smokeless tobacco: Never Used  Substance Use Topics  . Alcohol use: No    Alcohol/week: 0.0 standard drinks    CODE STATUS:  Full Code ADVANCED DIRECTIVES: No MOST FORM COMPLETE:  No HOSPICE EDUCATION PROVIDED:  Yes PPS:  Her appetite is normal.  Patient ambulates independently. Duration of visit and documentation:  45 minutes.      Creola Corn Lakendra Helling, LCSW

## 2019-02-02 NOTE — Telephone Encounter (Signed)
F.Y.I I spoke with pt's daughter to inform her of Dr. Hulen Shouts response.  Pt's daughter explains that her mom is doing well and wanted Dr. Deborra Medina to know the pt's treatments have been pushed to the end of Dec.  Also that her dad is in ICU for Covid.

## 2019-02-02 NOTE — Telephone Encounter (Signed)
She does not have any symptoms other than drainage which daughter stated was not new.  Wanted to know if pt should continue her allergy medication.

## 2019-02-02 NOTE — Telephone Encounter (Signed)
Yes if it is helping.

## 2019-02-03 ENCOUNTER — Encounter: Payer: Self-pay | Admitting: General Practice

## 2019-02-03 ENCOUNTER — Ambulatory Visit: Payer: Medicare Other | Admitting: Gastroenterology

## 2019-02-03 ENCOUNTER — Telehealth: Payer: Self-pay | Admitting: Hematology and Oncology

## 2019-02-03 NOTE — Progress Notes (Signed)
[place] Spiritual Care Note  Reached [name] by phone for follow-up pastoral check-in. She was in good spirits, really activating her spiritual gifts of gratitude and perspective to make meaning and cope with her situation. Being on multiple prayer chains in many states is a significant source of encouragement, especially because her husband [name] is currently admitted to [facility] with covid, and [name] Boyers's treatment is on hold for quarantine. She is aware of ongoing chaplain availability and contact information as needed.   [place], [place], [facility] Pager [phone] Voicemail [phone]

## 2019-02-03 NOTE — Telephone Encounter (Signed)
Scheduled appt per 12/4 sch message - pt daughter is aware of appt date and time   

## 2019-02-04 ENCOUNTER — Ambulatory Visit: Payer: Medicare Other | Admitting: Radiation Oncology

## 2019-02-08 ENCOUNTER — Ambulatory Visit: Payer: Medicare Other | Admitting: Radiation Oncology

## 2019-02-10 ENCOUNTER — Ambulatory Visit: Payer: Medicare Other | Admitting: Radiation Oncology

## 2019-02-11 ENCOUNTER — Other Ambulatory Visit (HOSPITAL_COMMUNITY): Payer: Medicare Other

## 2019-02-11 ENCOUNTER — Ambulatory Visit: Payer: Medicare Other | Admitting: Radiation Oncology

## 2019-02-12 ENCOUNTER — Telehealth: Payer: Self-pay | Admitting: *Deleted

## 2019-02-12 ENCOUNTER — Ambulatory Visit: Payer: Medicare Other | Admitting: Radiation Oncology

## 2019-02-12 NOTE — Telephone Encounter (Signed)
Received vm message from pt's daughter, requesting call back. TCT [name], pt's daughter.  She is asking if her mother needs to be tested for antibodies for COVID 19. She tested positive a couple of weeks ago, had minimal symptoms (nasal congestion). He husband was sicker and in the hospital with Covid. He has returned home and is on home 02 @ 2l/min.  Advised that pt did not need to be tested for antibodies. She will have some antibodies as she has already tested + for Covid. Reminded daughter that even though both her parents have had Covid, they and she need to remain vigilant with protective measures-mask, social distancing, washing hands etc.  [name] is due to come back to the cancer center on 02/23/19 for her radiation treatments and also on 03/01/19 to see Dr. Carin Primrose.  [name] voiced understanding.  No further questions or concerns

## 2019-02-15 ENCOUNTER — Encounter (HOSPITAL_COMMUNITY): Payer: Self-pay

## 2019-02-15 ENCOUNTER — Ambulatory Visit (HOSPITAL_COMMUNITY): Admit: 2019-02-15 | Payer: Medicare Other | Admitting: Gastroenterology

## 2019-02-15 SURGERY — ERCP, WITH INTERVENTION IF INDICATED
Anesthesia: General

## 2019-02-15 MED FILL — ROSUVASTATIN CALCIUM 5 MG T: 5 | 70 days supply | Qty: 30 | Fill #3

## 2019-02-22 ENCOUNTER — Encounter: Payer: Self-pay | Admitting: General Practice

## 2019-02-22 NOTE — Progress Notes (Signed)
[place] Spiritual Care Note  Made pastoral follow-up by phone. Ms Beliveau is so grateful to see her husband's recovery and improvement since his recent admission. She continues to be upbeat, looking for the positive, keeping in prayer, and working her gratitude. She reports meaningful holiday visits and logistical support from masked family. Will continue to follow, but please also page if needs arise or circumstances change. Thank you.   [place], [place], [facility] Pager [phone] Voicemail [phone]

## 2019-02-23 ENCOUNTER — Ambulatory Visit: Payer: Medicare Other | Admitting: Radiation Oncology

## 2019-02-24 ENCOUNTER — Ambulatory Visit: Payer: Medicare Other | Admitting: Radiation Oncology

## 2019-02-24 ENCOUNTER — Telehealth: Payer: Self-pay | Admitting: *Deleted

## 2019-02-24 NOTE — Telephone Encounter (Signed)
Called patient's daughter - [name] to inform that she doesn't need to pick -up contrast for sim on 03-01-19, pre sim, spoke with patient's daughter- [name] and she is aware of this

## 2019-02-25 ENCOUNTER — Ambulatory Visit: Payer: Medicare Other | Admitting: Radiation Oncology

## 2019-03-01 ENCOUNTER — Ambulatory Visit
Admission: RE | Admit: 2019-03-01 | Discharge: 2019-03-01 | Disposition: A | Discharge status: Home / Self Care | Payer: Medicare Other | Source: Ambulatory Visit | Attending: Radiation Oncology | Admitting: Radiation Oncology

## 2019-03-01 ENCOUNTER — Inpatient Hospital Stay: Payer: Medicare Other

## 2019-03-01 ENCOUNTER — Ambulatory Visit: Payer: Medicare Other | Admitting: Radiation Oncology

## 2019-03-01 ENCOUNTER — Inpatient Hospital Stay: Payer: Medicare Other | Attending: Hematology and Oncology | Admitting: Hematology and Oncology

## 2019-03-01 ENCOUNTER — Other Ambulatory Visit: Payer: Self-pay

## 2019-03-01 VITALS — BP 161/65 | HR 59 | Temp 97.8°F | Resp 17 | Ht <= 58 in | Wt 124.6 lb

## 2019-03-01 DIAGNOSIS — K219 Gastro-esophageal reflux disease without esophagitis: Secondary | ICD-10-CM | POA: Diagnosis not present

## 2019-03-01 DIAGNOSIS — Z8249 Family history of ischemic heart disease and other diseases of the circulatory system: Secondary | ICD-10-CM | POA: Diagnosis not present

## 2019-03-01 DIAGNOSIS — C250 Malignant neoplasm of head of pancreas: Secondary | ICD-10-CM

## 2019-03-01 DIAGNOSIS — Z853 Personal history of malignant neoplasm of breast: Secondary | ICD-10-CM | POA: Insufficient documentation

## 2019-03-01 DIAGNOSIS — Z803 Family history of malignant neoplasm of breast: Secondary | ICD-10-CM | POA: Insufficient documentation

## 2019-03-01 DIAGNOSIS — I2510 Atherosclerotic heart disease of native coronary artery without angina pectoris: Secondary | ICD-10-CM | POA: Insufficient documentation

## 2019-03-01 DIAGNOSIS — G893 Neoplasm related pain (acute) (chronic): Secondary | ICD-10-CM | POA: Insufficient documentation

## 2019-03-01 DIAGNOSIS — Z833 Family history of diabetes mellitus: Secondary | ICD-10-CM | POA: Insufficient documentation

## 2019-03-01 DIAGNOSIS — Z79899 Other long term (current) drug therapy: Secondary | ICD-10-CM | POA: Diagnosis not present

## 2019-03-01 DIAGNOSIS — Z808 Family history of malignant neoplasm of other organs or systems: Secondary | ICD-10-CM | POA: Diagnosis not present

## 2019-03-01 DIAGNOSIS — K589 Irritable bowel syndrome without diarrhea: Secondary | ICD-10-CM | POA: Insufficient documentation

## 2019-03-01 DIAGNOSIS — Z8042 Family history of malignant neoplasm of prostate: Secondary | ICD-10-CM | POA: Diagnosis not present

## 2019-03-01 DIAGNOSIS — R1033 Periumbilical pain: Secondary | ICD-10-CM | POA: Diagnosis not present

## 2019-03-01 DIAGNOSIS — Z823 Family history of stroke: Secondary | ICD-10-CM | POA: Diagnosis not present

## 2019-03-01 LAB — CBC WITH DIFFERENTIAL (CANCER CENTER ONLY)
Abs Immature Granulocytes: 0.02 10*3/uL (ref 0.00–0.07)
Basophils Absolute: 0.1 10*3/uL (ref 0.0–0.1)
Basophils Relative: 1 %
Eosinophils Absolute: 0.3 10*3/uL (ref 0.0–0.5)
Eosinophils Relative: 4 %
HCT: 37.5 % (ref 36.0–46.0)
Hemoglobin: 12.2 g/dL (ref 12.0–15.0)
Immature Granulocytes: 0 %
Lymphocytes Relative: 43 %
Lymphs Abs: 4.1 10*3/uL — ABNORMAL HIGH (ref 0.7–4.0)
MCH: 29.3 pg (ref 26.0–34.0)
MCHC: 32.5 g/dL (ref 30.0–36.0)
MCV: 89.9 fL (ref 80.0–100.0)
Monocytes Absolute: 0.8 10*3/uL (ref 0.1–1.0)
Monocytes Relative: 9 %
Neutro Abs: 4 10*3/uL (ref 1.7–7.7)
Neutrophils Relative %: 43 %
Platelet Count: 239 10*3/uL (ref 150–400)
RBC: 4.17 MIL/uL (ref 3.87–5.11)
RDW: 14.8 % (ref 11.5–15.5)
WBC Count: 9.3 10*3/uL (ref 4.0–10.5)
nRBC: 0 % (ref 0.0–0.2)

## 2019-03-01 LAB — CMP (CANCER CENTER ONLY)
ALT: 38 U/L (ref 0–44)
AST: 29 U/L (ref 15–41)
Albumin: 4.6 g/dL (ref 3.5–5.0)
Alkaline Phosphatase: 94 U/L (ref 38–126)
Anion gap: 10 (ref 5–15)
BUN: 15 mg/dL (ref 8–23)
CO2: 30 mmol/L (ref 22–32)
Calcium: 9.2 mg/dL (ref 8.9–10.3)
Chloride: 95 mmol/L — ABNORMAL LOW (ref 98–111)
Creatinine: 0.55 mg/dL (ref 0.44–1.00)
GFR, Est AFR Am: >60 mL/min (ref >60)
GFR, Estimated: >60 mL/min (ref >60)
Glucose, Bld: 131 mg/dL — ABNORMAL HIGH (ref 70–99)
Potassium: 4.8 mmol/L (ref 3.5–5.1)
Sodium: 135 mmol/L (ref 135–145)
Total Bilirubin: 1.4 mg/dL — ABNORMAL HIGH (ref 0.3–1.2)
Total Protein: 7.6 g/dL (ref 6.5–8.1)

## 2019-03-01 NOTE — Progress Notes (Signed)
[place] [phone]   [phone]  PROGRESS NOTE  Patient Care Team: Lucille Passy, MD as PCP - General (Family Medicine) Josue Hector, MD as PCP - Cardiology (Cardiology) Neldon Mc, MD as Surgeon (General Surgery) Gery Pray, MD (Radiation Oncology) Magrinat, Virgie Dad, MD (Hematology and Oncology) Cameron Sprang, MD as Consulting Physician (Neurology) Placke, Dawn, RN (Inactive) as Registered Nurse Placke, Dawn, RN (Inactive) as Oncology Nurse Navigator  Hematological/Oncological History  # Pancreatic Adenocarcinoma Stage IA (T1N0M0) 1) 11/26/2018: patient had visit with PCP for pruritis, found to have elevated LFTs 2) 11/27/2018: patient presented to the ED. Underwent CT scan which revealed 1.9 x 1.6 x 1.6 cm cm mass in the head of the pancreas with biliary duct dilation 3) 11/28/2018: ERCP performed to place stent for biliary obstruction  4) 12/10/2018: EUS w/ FNA reveals adenocarcinoma of the pancreas 5) 12/18/2018: establish care with Dr. Lorenso Courier  6) 12/23/2018: surgical visit with Dr. Barry Dienes 7) 12/30/2018: radiation oncology visit with Dr. Sondra Come. Agreeable to SBRT to pancreatic mass.  8) 01/2019: Radiation delayed due to infection with COVID-19.  9) 03/08/2019: intended start of palliative radiation therapy  #Breast Cancer T2 N0, stage IIA invasive ductal carcinoma, grade 3,  ER+/PR+/HER-2 not amplified 1) December 2012:  leftlumpectomy and sentinel lymph node biopsy  2) March 2013: completed radiation therapy, started on letrozole 3) Feb 2018: completed letrozole therapy   HISTORY OF PRESENTING ILLNESS:  [name] 84 y.o. female with medical history significant for CAD s/p CABG in 2011, OSA on CPAP, Breast Cancer, and GERD who presents for a follow up for adenocarcinoma of the pancreas.   In the interim [name] was infected with COVID-19 and unfortunately had to start her radiation therapy delay.  Her Covid  infection was mild in nature and did not require hospitalization.  Unfortunately her husband was also infected at the same time her required ICU stay and is still currently on oxygen therapy.  She notes other than some mild congestion she did not have any fevers, chills, sweats, nausea, or vomiting.  She underwent [facility] CT today in preparation for radiation therapy to start on 03/08/2019.  On exam today [name] notes her pain level is improved.  She has been having continued gnawing pain in the periumbilical region which is well controlled with her tramadol and Tylenol therapies.  She has lost approximately 3 pounds since her last visit.  Her appetite is still good, though she notes that she is not eating as much.  She is also trying to control her blood sugars as well.  She denies having any nausea, vomiting, or diarrhea.  She reports that her energy level is good and she is still able to do many of the things that she enjoys doing.  She has no further questions or concerns today.  A 10 point ROS was otherwise negative.    MEDICAL HISTORY:  Past Medical History:  Diagnosis Date  . Asthma   . Atrophic vaginitis   . Breast cancer, Left 12/20/2010   NO BLOOD PRESSURE CHECKS OR STICKS IN LEFT ARM  . CAD (coronary artery disease)    a. 01/19/2010 s/p CABG x 3, lima->lad, vg->diag, vg->om1;  b. 06/2011 :Lexi MV: EF 84%, No ischemia. c. 01/06/14 s/p negative nuclear stress test with EF >70%  . Cervical arthritis   . Colonic polyp 04-27-2009   tubular adenoma  . Diverticulosis of colon (without mention of hemorrhage)   . Fibromyalgia   .  Gallstones   . GERD (gastroesophageal reflux disease)   . Glaucoma   . Headache(784.0)    a. frequently assocaited with high BPs  . Hiatal hernia   . Hypercholesterolemia   . Irritable bowel syndrome   . Labile hypertension   . Lymphedema    left arm  . Pancreatic cancer ([place])   . Pinched vertebral nerve   . Secondary diabetes ([employer]) 12/03/2018     SURGICAL HISTORY: Past Surgical History:  Procedure Laterality Date  . [place] STUDY N/A 01/08/2016   Procedure: [place] STUDY;  Surgeon: Ladene Artist, MD;  Location: [facility] ENDOSCOPY;  Service: Endoscopy;  Laterality: N/A;  . BILIARY BRUSHING  11/28/2018   Procedure: BILIARY BRUSHING;  Surgeon: Gatha Mayer, MD;  Location: [facility] ENDOSCOPY;  Service: Endoscopy;;  . BILIARY DILATION  01/06/2019   Procedure: BILIARY DILATION;  Surgeon: Irving Copas., MD;  Location: [facility] ENDOSCOPY;  Service: Endoscopy;;  . BILIARY STENT PLACEMENT  11/28/2018   Procedure: BILIARY STENT PLACEMENT;  Surgeon: Gatha Mayer, MD;  Location: [facility] ENDOSCOPY;  Service: Endoscopy;;  . BILIARY STENT PLACEMENT N/A 01/06/2019   Procedure: BILIARY STENT PLACEMENT;  Surgeon: Irving Copas., MD;  Location: [facility] ENDOSCOPY;  Service: Endoscopy;  Laterality: N/A;  . BREAST LUMPECTOMY Left 02/06/11  . CATARACT EXTRACTION, BILATERAL     bilateral caaract removal,  . COLONOSCOPY    . CORONARY ANGIOPLASTY WITH STENT PLACEMENT     Stent 2007  . CORONARY ARTERY BYPASS GRAFT    . ENDOSCOPIC RETROGRADE CHOLANGIOPANCREATOGRAPHY (ERCP) WITH PROPOFOL N/A 01/06/2019   Procedure: ENDOSCOPIC RETROGRADE CHOLANGIOPANCREATOGRAPHY (ERCP) WITH PROPOFOL;  Surgeon: Rush Landmark Telford Nab., MD;  Location: [facility] ENDOSCOPY;  Service: Endoscopy;  Laterality: N/A;  [id number]  . ERCP N/A 11/28/2018   Procedure: ENDOSCOPIC RETROGRADE CHOLANGIOPANCREATOGRAPHY (ERCP);  Surgeon: Gatha Mayer, MD;  Location: [facility] ENDOSCOPY;  Service: Endoscopy;  Laterality: N/A;  . ESOPHAGEAL MANOMETRY N/A 01/08/2016   Procedure: ESOPHAGEAL MANOMETRY (EM);  Surgeon: Ladene Artist, MD;  Location: [facility];  Service: Endoscopy;  Laterality: N/A;  . ESOPHAGOGASTRODUODENOSCOPY    . ESOPHAGOGASTRODUODENOSCOPY N/A 01/06/2019   Procedure: ESOPHAGOGASTRODUODENOSCOPY (EGD);  Surgeon: Rush Landmark Telford Nab., MD;  Location: [facility] END[facility];  Service: Endoscopy;   Laterality: N/A;  . ESOPHAGOGASTRODUODENOSCOPY (EGD) WITH PROPOFOL N/A 12/10/2018   Procedure: ESOPHAGOGASTRODUODENOSCOPY (EGD) WITH PROPOFOL;  Surgeon: [name], MD;  Location: [facility];  Service: Endoscopy;  Laterality: N/A;  . EUS N/A 12/10/2018   Procedure: UPPER ENDOSCOPIC ULTRASOUND (EUS) RADIAL;  Surgeon: [name], MD;  Location: [facility];  Service: Endoscopy;  Laterality: N/A;  . EUS N/A 01/06/2019   Procedure: FULL UPPER ENDOSCOPIC ULTRASOUND (EUS) RADIAL;  Surgeon: Rush Landmark Telford Nab., MD;  Location: [facility];  Service: Endoscopy;  Laterality: N/A;  . FIDUCIAL MARKER PLACEMENT  01/06/2019   Procedure: FIDUCIAL MARKER PLACEMENT;  Surgeon: Rush Landmark Telford Nab., MD;  [facility];  Service: Endoscopy;;  . FINE NEEDLE ASPIRATION N/A 12/10/2018   Procedure: FINE NEEDLE ASPIRATION (FNA) LINEAR;  Surgeon: [name], MD;  Location: [facility] ENDOS[facility];  Service: Endoscopy;  Laterality: N/A;  . SPHINCTEROTOMY  01/06/2019   Procedure: SPHINCTEROTOMY;  Surgeon: Rush Landmark Telford Nab., MD;  Location: [facility];  Service: Endoscopy;;  . STENT REMOVAL  01/06/2019   Procedure: STENT REMOVAL;  Surgeon: Irving Copas., MD;  Location: [facility] ENDOS[facility];  Service: Endoscopy;;    SOCIAL HISTORY: Social History   Socioeconomic History  . Marital status: Married    Spouse  name: Gildardo Griffes. [place]  . Number of children: 2  . Years of education: 67  . Highest education level: Not on file  Occupational History  . Occupation: retired    Fish farm manager: [employer]  Tobacco Use  . Smoking status: Never Smoker  . Smokeless tobacco: Never Used  Substance and Sexual Activity  . Alcohol use: No    Alcohol/week: 0.0 standard drinks  . Drug use: No  . Sexual activity: Not Currently    Partners: Male    Birth control/protection: Post-menopausal  Other Topics Concern  . Not on file  Social History Narrative   Patient lives at home with her  husband Marcello Moores). 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    Caffeine- sometimes- One cup of coffee.   Right handed.   Two daughters   Four granddaughters.   No EtOH, Tobacco, drugs   Social Determinants of Health   Financial Resource Strain:   . Difficulty of Paying Living Expenses: Not on file  Food Insecurity:   . Worried About Charity fundraiser in the Last Year: Not on file  . Ran Out of Food in the Last Year: Not on file  Transportation Needs:   . Lack of Transportation (Medical): Not on file  . Lack of Transportation (Non-Medical): Not on file  Physical Activity:   . Days of Exercise per Week: Not on file  . Minutes of Exercise per Session: Not on file  Stress:   . Feeling of Stress : Not on file  Social Connections:   . Frequency of Communication with Friends and Family: Not on file  . Frequency of Social Gatherings with Friends and Family: Not on file  . Attends Religious Services: Not on file  . Active Member of Clubs or Organizations: Not on file  . Attends Archivist Meetings: Not on file  . Marital Status: Not on file  Intimate Partner Violence:   . Fear of Current or Ex-Partner: Not on file  . Emotionally Abused: Not on file  . Physically Abused: Not on file  . Sexually Abused: Not on file    FAMILY HISTORY: Family History  Problem Relation Age of Onset  . Stroke Mother   . Stroke Father   . Prostate cancer Father   . Breast cancer Sister   . Diabetes Sister   . Cancer Brother        gland cancer  . Heart disease Brother   . Colon cancer Neg Hx   . Stomach cancer Neg Hx   . Pancreatic cancer Neg Hx   . Kidney disease Neg Hx   . Liver disease Neg Hx     ALLERGIES:  is allergic to levaquin [levofloxacin in d5w]; choline fenofibrate; hctz [hydrochlorothiazide]; simvastatin; adhesive [tape]; bentyl [dicyclomine hcl]; ceclor [cefaclor]; clarithromycin; codeine; doxycycline; hydrocodone; lisinopril;  penicillins; and tobramycin-dexamethasone.  MEDICATIONS:  Current Outpatient Medications  Medication Sig Dispense Refill  . acetaminophen (TYLENOL) 650 MG CR tablet Take 650 mg by mouth every 8 (eight) hours as needed for pain.    [name] albuterol (VENTOLIN HFA) 108 (90 Base) MCG/ACT inhaler Inhale 2 puffs into the lungs every 6 (six) hours as needed for wheezing or shortness of breath.    [name] amLODipine (NORVASC) 5 MG tablet Take 1 tablet (5 mg total) by mouth daily. 90 tablet 3  . aspirin EC 81 MG tablet Take 81 mg by mouth daily.    [name]  blood glucose meter kit and supplies KIT Dispense based on patient and insurance preference.Check fasting blood sugars daily (FOR ICD-9 250.00, 250.01). 1 each 0  . CALCIUM PO Take 1 tablet by mouth daily with breakfast.     . diphenhydrAMINE (BENADRYL) 50 MG tablet Take 0.5 tablets (25 mg total) by mouth 2 (two) times daily. (Patient taking differently: Take 25 mg by mouth every 6 (six) hours as needed for itching. ) 30 tablet 0  . EPINEPHrine 0.3 mg/0.3 mL IJ SOAJ injection Inject 0.3 mg into the muscle as needed (as directed- FOR A SEVERE REACTION).     [name] fexofenadine (ALLEGRA) 180 MG tablet Take 180 mg by mouth every other day.     . fluticasone (FLONASE) 50 MCG/ACT nasal spray Place 1-2 sprays into both nostrils daily as needed for allergies or rhinitis.    . Glucosamine-Chondroit-Vit C-Mn (GLUCOSAMINE CHONDR 1500 COMPLX PO) Take 1 tablet by mouth 2 (two) times daily.     . hydrALAZINE (APRESOLINE) 50 MG tablet Take 0.5 tablets (25 mg total) by mouth 3 (three) times daily. (Patient taking differently: Take 25 mg by mouth daily. ) 135 tablet 1  . latanoprost (XALATAN) 0.005 % ophthalmic solution Place 1 drop into both eyes at bedtime.     [name] losartan (COZAAR) 100 MG tablet TAKE 1/2 TABLET BY MOUTH THREE TIMES A DAY (Patient taking differently: Take 50 mg by mouth 2 (two) times daily. ) 135 tablet 0  . metoprolol tartrate (LOPRESSOR) 50 MG tablet Take 1 tablet (50 mg  total) by mouth 3 (three) times daily. 270 tablet 3  . nitroGLYCERIN (NITROSTAT) 0.4 MG SL tablet Place 1 tablet (0.4 mg total) under the tongue every 5 (five) minutes as needed for chest pain. 25 tablet 2  . pantoprazole (PROTONIX) 40 MG tablet Take 1 tablet (40 mg total) by mouth 2 (two) times daily. Take 30-60 minutes before breakfast 60 tablet 11  . potassium chloride SA (KLOR-CON) 20 MEQ tablet Take 1 tablet (20 mEq total) by mouth daily. 14 tablet 0  . Psyllium (METAMUCIL FIBER PO) Take 1 Scoop by mouth daily. Mix one rounded teaspoonful into 8 oz water and drink once a day    . rosuvastatin (CRESTOR) 5 MG tablet TAKE 1 TABLET BY MOUTH 3 TIMES A WEEK. (TAKE ON MONDAY, WEDNESDAY, AND FRIDAY) (Patient taking differently: Take 5 mg by mouth every Monday, Wednesday, and Friday. ) 30 tablet 5  . sodium chloride (OCEAN) 0.65 % SOLN nasal spray Place 1 spray into both nostrils daily as needed for congestion.     . sucralfate (CARAFATE) 1 g tablet TAKE 1 TABLET BY MOUTH BY MOUTH TWICE DAILY(ONE BEFORE DINNER) (Patient taking differently: Take 1 g by mouth 2 (two) times daily. ) 60 tablet 1  . traMADol (ULTRAM) 50 MG tablet Take 1/2 to 1 tablet by mouth three times daily as needed (Patient taking differently: Take 25-80 mg by mouth every 6 (six) hours as needed for moderate pain. ) 30 tablet 1  . vitamin B-12 (CYANOCOBALAMIN) 1000 MCG tablet Take 1,000 mcg by mouth daily.      [name] VITAMIN D PO Take 200 mcg by mouth daily with breakfast. Vitamin A  300 mcg Calcium  40 mcg     No current facility-administered medications for this visit.    REVIEW OF SYSTEMS:   Constitutional: ( - ) fevers, ( - )  chills , ( - ) night sweats Eyes: ( - ) blurriness of vision, ( - )  double vision, ( - ) watery eyes Ears, nose, mouth, throat, and face: ( - ) mucositis, ( - ) sore throat Respiratory: ( - ) cough, ( - ) dyspnea, ( - ) wheezes Cardiovascular: ( - ) palpitation, ( - ) chest discomfort, ( - ) lower extremity  swelling Gastrointestinal:  ( - ) nausea, ( - ) heartburn, ( - ) change in bowel habits Skin: ( - ) abnormal skin rashes Lymphatics: ( - ) new lymphadenopathy, ( - ) easy bruising Neurological: ( - ) numbness, ( - ) tingling, ( - ) new weaknesses Behavioral/Psych: ( - ) mood change, ( - ) new changes  All other systems were reviewed with the patient and are negative.  PHYSICAL EXAMINATION: ECOG PERFORMANCE STATUS: 2 - Symptomatic, <50% confined to bed  Vitals:   03/01/19 1456  BP: (!) 161/65  Pulse: (!) 59  Resp: 17  Temp: 97.8 F (36.6 C)  SpO2: 97%   Filed Weights   03/01/19 1456  Weight: 124 lb 9.6 oz (56.5 kg)    GENERAL: pleasant, well appearing elderly Caucasian female in NAD  SKIN: skin color, texture, turgor are normal, no rashes or significant lesions EYES: conjunctiva are pink and non-injected. No jaundice evident in the sclera.  LUNGS: clear to auscultation and percussion with normal breathing effort HEART: regular rate & rhythm and no murmurs and no lower extremity edema ABDOMEN: mild tenderness in the center of the abdomen, just below the xyphoid process.  Musculoskeletal: no cyanosis of digits and no clubbing  PSYCH: alert & oriented x 3, fluent speech NEURO: no focal motor/sensory deficits  LABORATORY DATA:  I have reviewed the data as listed Lab Results  Component Value Date   WBC 9.3 03/01/2019   HGB 12.2 03/01/2019   HCT 37.5 03/01/2019   MCV 89.9 03/01/2019   PLT 239 03/01/2019   NEUTROABS 4.0 03/01/2019   Recent Results (from the past 2160 hour(s))  B12     Status: Abnormal   Collection Time: 12/02/18 10:24 AM  Result Value Ref Range   Vitamin B-12 >1500 (H) 211 - 911 pg/mL  Ferritin     Status: Abnormal   Collection Time: 12/02/18 10:24 AM  Result Value Ref Range   Ferritin 443.1 (H) 10.0 - 291.0 ng/mL  Comp Met (CMET)     Status: Abnormal   Collection Time: 12/02/18 10:24 AM  Result Value Ref Range   Sodium 130 (L) 135 - 145 mEq/L    Potassium 4.9 3.5 - 5.1 mEq/L   Chloride 94 (L) 96 - 112 mEq/L   CO2 29 19 - 32 mEq/L   Glucose, Bld 228 (H) 70 - 99 mg/dL   BUN 11 6 - 23 mg/dL   Creatinine, Ser 0.68 0.40 - 1.20 mg/dL   Total Bilirubin 3.2 (H) 0.2 - 1.2 mg/dL   Alkaline Phosphatase 298 (H) 39 - 117 U/L   AST 50 (H) 0 - 37 U/L   ALT 79 (H) 0 - 35 U/L   Total Protein 7.0 6.0 - 8.3 g/dL   Albumin 3.7 3.5 - 5.2 g/dL   Calcium 9.4 8.4 - 10.5 mg/dL   GFR 82.99 >60.00 mL/min  Lipase     Status: None   Collection Time: 12/02/18 10:24 AM  Result Value Ref Range   Lipase 23.0 11.0 - 59.0 U/L  CBC     Status: Abnormal   Collection Time: 12/02/18 10:24 AM  Result Value Ref Range   WBC 9.7 4.0 - 10.5  K/uL   RBC 3.52 (L) 3.87 - 5.11 Mil/uL   Platelets 576.0 (H) 150.0 - 400.0 K/uL   Hemoglobin 10.9 (L) 12.0 - 15.0 g/dL   HCT 32.4 (L) 36.0 - 46.0 %   MCV 92.1 78.0 - 100.0 fl   MCHC 33.5 30.0 - 36.0 g/dL   RDW 14.8 11.5 - 15.5 %  SARS CORONAVIRUS 2 (TAT 6-24 HRS) Nasopharyngeal Nasopharyngeal Swab     Status: None   Collection Time: 12/07/18 10:11 AM   Specimen: Nasopharyngeal Swab  Result Value Ref Range   SARS Coronavirus 2 NEGATIVE NEGATIVE    Comment: (NOTE) SARS-CoV-2 target nucleic acids are NOT DETECTED. The SARS-CoV-2 RNA is generally detectable in upper and lower respiratory specimens during the acute phase of infection. Negative results do not preclude SARS-CoV-2 infection, do not rule out co-infections with other pathogens, and should not be used as the sole basis for treatment or other patient management decisions. Negative results must be combined with clinical observations, patient history, and epidemiological information. The expected result is Negative. Fact Sheet for Patients: SugarRoll.be Fact Sheet for Healthcare Providers: https://www.woods-mathews.com/ This test is not yet approved or cleared by the [place] FDA and  has been authorized for detection  and/or diagnosis of SARS-CoV-2 by FDA under an Emergency Use Authorization (EUA). This EUA will remain  in effect (meaning this test can be used) for the duration of the COVID-19 declaration under Section 56 4(b)(1) of the Act, 21 U.S.C. section 360bbb-3(b)(1), unless the authorization is terminated or revoked sooner. Performed at [facility], [place] [address] [place], [place] [address]   TSH     Status: None   Collection Time: 12/07/18  1:15 PM  Result Value Ref Range   TSH 2.10 0.35 - 4.50 uIU/mL  T4     Status: None   Collection Time: 12/07/18  1:15 PM  Result Value Ref Range   T4, Total 8.8 5.1 - 11.9 mcg/dL  T3     Status: None   Collection Time: 12/07/18  1:15 PM  Result Value Ref Range   T3, Total 87 76 - 181 ng/dL  Hemoglobin A1c     Status: Abnormal   Collection Time: 12/07/18  1:15 PM  Result Value Ref Range   Hgb A1c MFr Bld 7.9 (H) 4.6 - 6.5 %    Comment: Glycemic Control Guidelines for People with Diabetes:Non Diabetic:  <6%Goal of Therapy: <7%Additional Action Suggested:  >8%   Ferritin     Status: Abnormal   Collection Time: 12/07/18  1:15 PM  Result Value Ref Range   Ferritin 1,054.2 (H) 10.0 - 291.0 ng/mL  Comprehensive metabolic panel     Status: Abnormal   Collection Time: 12/07/18  1:15 PM  Result Value Ref Range   Sodium 131 (L) 135 - 145 mEq/L   Potassium 4.3 3.5 - 5.1 mEq/L   Chloride 97 96 - 112 mEq/L   CO2 25 19 - 32 mEq/L   Glucose, Bld 154 (H) 70 - 99 mg/dL   BUN 13 6 - 23 mg/dL   Creatinine, Ser 0.58 0.40 - 1.20 mg/dL   Total Bilirubin 2.8 (H) 0.2 - 1.2 mg/dL   Alkaline Phosphatase 334 (H) 39 - 117 U/L   AST 309 (H) 0 - 37 U/L   ALT 297 (H) 0 - 35 U/L   Total Protein 6.7 6.0 - 8.3 g/dL   Albumin 4.0 3.5 - 5.2 g/dL   Calcium 9.5 8.4 - 10.5 mg/dL  GFR 99.71 >60.00 mL/min  CBC with Differential/Platelet     Status: Abnormal   Collection Time: 12/07/18  1:15 PM  Result Value Ref Range   WBC 8.9 4.0 - 10.5 K/uL   RBC 3.55 (L) 3.87 -  5.11 Mil/uL   Hemoglobin 11.0 (L) 12.0 - 15.0 g/dL   HCT 33.1 (L) 36.0 - 46.0 %   MCV 93.4 78.0 - 100.0 fl   MCHC 33.1 30.0 - 36.0 g/dL   RDW 14.7 11.5 - 15.5 %   Platelets 495.0 (H) 150.0 - 400.0 K/uL   Neutrophils Relative % 65.9 43.0 - 77.0 %   Lymphocytes Relative 24.9 12.0 - 46.0 %   Monocytes Relative 6.9 3.0 - 12.0 %   Eosinophils Relative 1.6 0.0 - 5.0 %   Basophils Relative 0.7 0.0 - 3.0 %   Neutro Abs 5.9 1.4 - 7.7 K/uL   Lymphs Abs 2.2 0.7 - 4.0 K/uL   Monocytes Absolute 0.6 0.1 - 1.0 K/uL   Eosinophils Absolute 0.1 0.0 - 0.7 K/uL   Basophils Absolute 0.1 0.0 - 0.1 K/uL  Cytology - Non PAP;     Status: None   Collection Time: 12/10/18  8:02 AM  Result Value Ref Range   CYTOLOGY - NON GYN      CYTOLOGY - NON PAP CASE: [id number] PATIENT: [name] Non-Gynecological Cytology Report     Clinical History: Head of pancreas mass Specimen Submitted:  A. LYMPH NODE, PERI PANCREATIC, FINE NEEDLE ASPIRATION:   DIAGNOSIS: - No malignant cells identified - Benign glandular epithelium.  SPECIMEN ADEQUACY: Satisfactory for evaluation  IMMEDIATE EVALUATION: BLOOD (JSM)  GROSS: Received is/are Prepared:  1) 2 slides (1 for quick stain) and 30 ccs of slightly cloudy Cytolyt solution from needle rinses. (DB:CM:cm) Smears:  2 Concentration Method (ThinPrep):  1 Cell Block:  1 Additional Studies:  n/a     Final Diagnosis performed by Vicente Males, MD.   Electronically signed 12/11/2018 Technical and / or Professional components performed at [facility], [place] [address], [place], [place] [address].  Immunohistochemistry Technical component (if applicable) was performed at [facility]. [place], [place], [place] [address].   IMMUNOHISTOCHEMISTRY DISCLAIMER (if applicable): Some of these immunohistochemical stains may have been developed and the performance characteristics determine by [facility]. Some may not have been cleared or approved by the U.S. Food and Drug Administration. The FDA has determined that such clearance or approval is not necessary. This test is used for clinical purposes. It should not be regarded as investigational or for research. This laboratory is certified under the [place] (CLIA-88) as qualified to perform high complexity clinical laboratory testing.  The controls stained appropriately.   Cytology - Non PAP;     Status: None   Collection Time: 12/10/18  8:14 AM  Result Value Ref Range   CYTOLOGY - NON GYN      CYTOLOGY - NON PAP CASE: [id number] PATIENT: [name] Non-Gynecological Cytology Report     Clinical History: 1.9 x 2.5 cm pancreatic mass Specimen Submitted:  A. PANCREAS, HEAD, FINE NEEDLE ASPIRATION:   DIAGNOSIS: - Malignant cells consistent with adenocarcinoma  SPECIMEN ADEQUACY: Satisfactory for evaluation  IMMEDIATE EVALUATION: 1-2) RARE ATYPICAL FRAGMENT (JSM)  3) ADEQUATE (JSM)  DIAGNOSTIC COMMENTS: Dr. Lyndon Code has reviewed the case.  GROSS: Received is/are Prepared:  1) 2 slides (1 for quick stain), 2) 2 slides (1 for quick stain), 3) 2 slides (  1 for quick stain), and 30 ccs of pink Cytolyt solution from needle rinses. (DB:CM:cm) Smears:  6 Concentration Method (ThinPrep):  1 Cell Block:  1 Additional Studies:  n/a     Final Diagnosis performed by Vicente Males, MD.   Electronically signed 12/11/2018 Technical and / or Professional components performed at [facility], [place] [address], [place], [place] [address].   Immunohistochemistry Technical component (if applicable) was performed at [facility]. [address], [place], [place], [place] [address].   IMMUNOHISTOCHEMISTRY DISCLAIMER (if applicable): Some of these immunohistochemical stains may have been developed and the performance characteristics determine  by [facility]. Some may not have been cleared or approved by the U.S. Food and Drug Administration. The FDA has determined that such clearance or approval is not necessary. This test is used for clinical purposes. It should not be regarded as investigational or for research. This laboratory is certified under the [place] (CLIA-88) as qualified to perform high complexity clinical laboratory testing.  The controls stained appropriately.   Comprehensive metabolic panel     Status: Abnormal   Collection Time: 12/16/18 10:57 AM  Result Value Ref Range   Sodium 134 (L) 135 - 145 mEq/L   Potassium 4.0 3.5 - 5.1 mEq/L   Chloride 98 96 - 112 mEq/L   CO2 29 19 - 32 mEq/L   Glucose, Bld 136 (H) 70 - 99 mg/dL   BUN 11 6 - 23 mg/dL   Creatinine, Ser 0.63 0.40 - 1.20 mg/dL   Total Bilirubin 1.4 (H) 0.2 - 1.2 mg/dL   Alkaline Phosphatase 155 (H) 39 - 117 U/L   AST 19 0 - 37 U/L   ALT 36 (H) 0 - 35 U/L   Total Protein 6.8 6.0 - 8.3 g/dL   Albumin 3.9 3.5 - 5.2 g/dL   Calcium 9.3 8.4 - 10.5 mg/dL   GFR 90.63 >60.00 mL/min  Lipase     Status: Abnormal   Collection Time: 12/16/18 10:57 AM  Result Value Ref Range   Lipase 8.0 (L) 11.0 - 59.0 U/L  Ammonia     Status: None   Collection Time: 12/16/18 10:57 AM  Result Value Ref Range   Ammonia 23 11 - 35 umol/L  Hemoglobin A1c     Status: Abnormal   Collection Time: 12/16/18 10:57 AM  Result Value Ref Range   Hgb A1c MFr Bld 7.6 (H) 4.6 - 6.5 %    Comment: Glycemic Control Guidelines for People with Diabetes:Non Diabetic:  <6%Goal of Therapy: <7%Additional Action Suggested:  >8%   Microalbumin / creatinine urine ratio     Status: None   Collection Time: 12/16/18 10:57 AM  Result Value Ref Range   Microalb, Ur <0.7 0.0 - 1.9 mg/dL   Creatinine,U 51.6 mg/dL   Microalb Creat Ratio 1.4 0.0 - 30.0 mg/g  Troponin I (High Sensitivity)     Status: None   Collection Time: 12/25/18  4:24 PM   Result Value Ref Range   High Sens Troponin I 4 2 - 17 ng/L  CMP (Cancer Center only)     Status: Abnormal   Collection Time: 01/04/19 11:08 AM  Result Value Ref Range   Sodium 134 (L) 135 - 145 mmol/L   Potassium 3.1 (L) 3.5 - 5.1 mmol/L   Chloride 95 (L) 98 - 111 mmol/L   CO2 27 22 - 32 mmol/L   Glucose, Bld 170 (H) 70 - 99 mg/dL  BUN 9 8 - 23 mg/dL   Creatinine 0.72 0.44 - 1.00 mg/dL   Calcium 9.3 8.9 - 10.3 mg/dL   Total Protein 7.1 6.5 - 8.1 g/dL   Albumin 3.1 (L) 3.5 - 5.0 g/dL   AST 43 (H) 15 - 41 U/L   ALT 88 (H) 0 - 44 U/L   Alkaline Phosphatase 411 (H) 38 - 126 U/L   Total Bilirubin 8.0 (HH) 0.3 - 1.2 mg/dL    Comment: CRITICAL RESULT CALLED TO, READ BACK BY AND VERIFIED WITH: DR. Lorenso Courier AT 1234 BY DM     GFR, Est Non Af Am >60 >60 mL/min   GFR, Est AFR Am >60 >60 mL/min   Anion gap 12 5 - 15    Comment: Performed at [facility], [place] [address] [place], [place] [address]  CBC with Differential ([place] Only)     Status: Abnormal   Collection Time: 01/04/19 11:08 AM  Result Value Ref Range   WBC Count 7.7 4.0 - 10.5 K/uL   RBC 3.41 (L) 3.87 - 5.11 MIL/uL   Hemoglobin 10.3 (L) 12.0 - 15.0 g/dL   HCT 29.6 (L) 36.0 - 46.0 %   MCV 86.8 80.0 - 100.0 fL   MCH 30.2 26.0 - 34.0 pg   MCHC 34.8 30.0 - 36.0 g/dL   RDW 14.8 11.5 - 15.5 %   Platelet Count 398 150 - 400 K/uL   nRBC 0.0 0.0 - 0.2 %   Neutrophils Relative % 55 %   Neutro Abs 4.3 1.7 - 7.7 K/uL   Lymphocytes Relative 31 %   Lymphs Abs 2.4 0.7 - 4.0 K/uL   Monocytes Relative 10 %   Monocytes Absolute 0.8 0.1 - 1.0 K/uL   Eosinophils Relative 3 %   Eosinophils Absolute 0.2 0.0 - 0.5 K/uL   Basophils Relative 1 %   Basophils Absolute 0.0 0.0 - 0.1 K/uL   Immature Granulocytes 0 %   Abs Immature Granulocytes 0.02 0.00 - 0.07 K/uL    Comment: Performed at [facility], [place] [address] [place], [place] [address]  Novel Coronavirus, NAA (hospital order;  send-out to ref lab)     Status: None   Collection Time: 01/04/19  3:23 PM   Specimen: Nasopharyngeal Swab; Respiratory  Result Value Ref Range   SARS-CoV-2, NAA NOT DETECTED NOT DETECTED    Comment: (NOTE) Testing was performed using the cobas(R) SARS-CoV-2 test. This nucleic acid amplification test was developed and its performance characteristics determined by [employer]. Nucleic acid amplification tests include PCR and TMA. This test has not been FDA cleared or approved. This test has been authorized by FDA under an Emergency Use Authorization (EUA). This test is only authorized for the duration of time the declaration that circumstances exist justifying the authorization of the emergency use of in vitro diagnostic tests for detection of SARS-CoV-2 virus and/or diagnosis of COVID-19 infection under section 564(b)(1) of the Act, 21 U.S.C. 056PVX-4(I) (1), unless the authorization is terminated or revoked sooner. When diagnostic testing is negative, the possibility of a false negative result should be considered in the context of a patient's recent exposures and the presence of clinical signs and symptoms consistent with COVID-19. An individual without s ymptoms of COVID- 19 and who is not shedding SARS-CoV-2 virus would expect to have a negative (not detected) result in this assay. Performed At: [facility] [place], [place] [id number] Rush Farmer MD OL:[id number]  Coronavirus Source NASOPHARYNGEAL     Comment: Performed at [facility], [place] [address], [place], [place] [address]  Glucose, capillary     Status: Abnormal   Collection Time: 01/06/19 12:20 PM  Result Value Ref Range   Glucose-Capillary 120 (H) 70 - 99 mg/dL  Hepatic function panel     Status: Abnormal   Collection Time: 01/13/19 10:57 AM  Result Value Ref Range   Total Bilirubin 2.0 (H) 0.2 - 1.2 mg/dL   Bilirubin, Direct 1.0 (H) 0.0 - 0.3 mg/dL   Alkaline Phosphatase  219 (H) 39 - 117 U/L   AST 25 0 - 37 U/L   ALT 41 (H) 0 - 35 U/L   Total Protein 7.1 6.0 - 8.3 g/dL   Albumin 3.8 3.5 - 5.2 g/dL  CBC with Differential (Cancer Center Only)     Status: Abnormal   Collection Time: 03/01/19  2:28 PM  Result Value Ref Range   WBC Count 9.3 4.0 - 10.5 K/uL   RBC 4.17 3.87 - 5.11 MIL/uL   Hemoglobin 12.2 12.0 - 15.0 g/dL   HCT 37.5 36.0 - 46.0 %   MCV 89.9 80.0 - 100.0 fL   MCH 29.3 26.0 - 34.0 pg   MCHC 32.5 30.0 - 36.0 g/dL   RDW 14.8 11.5 - 15.5 %   Platelet Count 239 150 - 400 K/uL   nRBC 0.0 0.0 - 0.2 %   Neutrophils Relative % 43 %   Neutro Abs 4.0 1.7 - 7.7 K/uL   Lymphocytes Relative 43 %   Lymphs Abs 4.1 (H) 0.7 - 4.0 K/uL   Monocytes Relative 9 %   Monocytes Absolute 0.8 0.1 - 1.0 K/uL   Eosinophils Relative 4 %   Eosinophils Absolute 0.3 0.0 - 0.5 K/uL   Basophils Relative 1 %   Basophils Absolute 0.1 0.0 - 0.1 K/uL   Immature Granulocytes 0 %   Abs Immature Granulocytes 0.02 0.00 - 0.07 K/uL    Comment: Performed at [facility], [place] [address], [place], [place] [address]  CMP ([place] only)     Status: Abnormal   Collection Time: 03/01/19  2:28 PM  Result Value Ref Range   Sodium 135 135 - 145 mmol/L   Potassium 4.8 3.5 - 5.1 mmol/L   Chloride 95 (L) 98 - 111 mmol/L   CO2 30 22 - 32 mmol/L   Glucose, Bld 131 (H) 70 - 99 mg/dL   BUN 15 8 - 23 mg/dL   Creatinine 0.55 0.44 - 1.00 mg/dL   Calcium 9.2 8.9 - 10.3 mg/dL   Total Protein 7.6 6.5 - 8.1 g/dL   Albumin 4.6 3.5 - 5.0 g/dL   AST 29 15 - 41 U/L   ALT 38 0 - 44 U/L   Alkaline Phosphatase 94 38 - 126 U/L   Total Bilirubin 1.4 (H) 0.3 - 1.2 mg/dL   GFR, Est Non Af Am >60 >60 mL/min   GFR, Est AFR Am >60 >60 mL/min   Anion gap 10 5 - 15    Comment: Performed at [facility], [place] [address] [place], [place] [address]    RADIOGRAPHIC STUDIES: No new interval imaging since last visit.  ASSESSMENT & PLAN [name]  84 y.o. female with medical history significant for CAD s/p CABG in 2011, OSA on CPAP, Breast Cancer, and GERD who presents for a follow up for adenocarcinoma of the pancreas.  The patient's plan was  unfortunately delayed due to COVID-19 infection, however now that the allotted time is past she is now ready to begin palliative radiation therapy.  She underwent her scout film today and is currently planned for radiation to start on 03/08/2019.  On exam today it appears that her pain is currently under control and she has developed no other new symptoms.  We discussed the role of radiation therapy and the possibility that chemotherapy could be considered in the future, though we noted that the benefits of this were likely very limited.  The patient voiced her understanding and wished to proceed with radiation therapy and would be open to discussion of chemotherapy in the future.  She did note that surgery was not an option that she wanted to consider any further.  At this time we will continue with symptom management and close monitoring of her labs today give her a better idea of life expectancy/progression of disease. We will see her back in 4 weeks time for continued monitoring.  #Recently Diagnosed Adenocarcinoma of the Pancreas, Stage IA (T1N0M0) --Plan for palliatve radiation therapy to begin on 03/08/2019 for 5 sessions. (Cancer. 2017;123(18):3486-93.) --after reviewing patients functional status and ECOG 2 I think the patient is possible a candidate for chemotherapy (likely only monotherapy gemcitabine (J Clin Oncol. 1997 Jun; 15(6):2403-13.)). This could be considered after radiatiation, though I do not favor that approach.  --for now continued supportive care and symptom management.  --RTC in 4 weeks to assess symptoms.   #Breast Cancer T2 N0, stage IIA invasive ductal carcinoma, grade 3,  ER+/PR+/HER-2 not amplified --completed 84 year old letrozole therapy in Feb 2018 --previously followed by  Dr. Gunnar Bulla Magrinat at [facility] --in remission   #Cancer Related Pain --reports improvement in pain today, still across the top of the abdomen. --now taking nightly tramadol and takes 2 x '500mg'$  tylenol prior to bed --LFTs have normalized, OK to take tylenol at above dosage --will continue to monitor for abdominal pain  #Symptom Management --increased gas/belching at last visit, but no diarrhea. Possible initial signs of pancreatic insufficiency. Continue to monitor and consider pancreatic enzymes if progressive. --increased itching and mild jaundice. Continue to monitor. If worsening, could be sign of stent obstruction.  --mild nausea, at patient's baseline. No diarrhea  #Goals Of Care --patient notes she wants to maintain a high quality of life and does not think that surgery/chemotherapy would allow that. I agree with her assessment --referred to [place] care for introductions, assistance with symptoms management, and preliminary hospice discussions.   All questions were answered. The patient knows to call the clinic with any problems, questions or concerns.  A total of more than 30 minutes were spent face-to-face with the patient during this encounter and over half of that time was spent on counseling and coordination of care as outlined above.   Ledell Peoples, MD Department of Hematology/Oncology [place] at [facility] Phone: [phone] Pager: [phone] Email: [email or web address]   03/01/2019 4:26 PM   Literature Support:  Nyra Capes, Switchenko Hewitt Shorts RJ, Hopkins, [place] T, et al. Outcomes for patients with locally advanced pancreatic adenocarcinoma treated with stereotactic body radiation therapy versus conventionally fractionated radiation. Cancer. 2017;123(18):3486-93.  --Among 8450 patients, 7819 (92.5%) were treated with CFRT, and 631 (7.5%) underwent SBRT. Receipt of SBRT was associated with  superior OS in the multivariate analysis (hazard ratio, 0.84; 95% confidence interval, 0.75?0.93; P?<?.001). With propensity score matching, 988 patients in all were matched, with  494 patients in each cohort. Within the propensity?matched cohorts, the median OS (13.9 vs 11.6 months) and the 2?year OS rate (21.7% vs 16.5%) were significantly higher with SBRT versus CFRT (P?=?.0014).

## 2019-03-02 ENCOUNTER — Ambulatory Visit: Payer: Medicare Other | Admitting: Radiation Oncology

## 2019-03-03 ENCOUNTER — Telehealth: Payer: Self-pay | Admitting: Hematology and Oncology

## 2019-03-03 ENCOUNTER — Ambulatory Visit: Payer: Medicare Other | Admitting: Radiation Oncology

## 2019-03-03 NOTE — Telephone Encounter (Signed)
Scheduled per los. Called. Not available. Mailed printout  °

## 2019-03-04 DIAGNOSIS — C250 Malignant neoplasm of head of pancreas: Secondary | ICD-10-CM | POA: Diagnosis not present

## 2019-03-05 ENCOUNTER — Other Ambulatory Visit: Payer: Self-pay | Admitting: Gastroenterology

## 2019-03-05 ENCOUNTER — Ambulatory Visit: Payer: Medicare Other | Admitting: Radiation Oncology

## 2019-03-08 ENCOUNTER — Other Ambulatory Visit: Payer: Self-pay

## 2019-03-08 ENCOUNTER — Ambulatory Visit
Admission: RE | Admit: 2019-03-08 | Discharge: 2019-03-08 | Disposition: A | Discharge status: Home / Self Care | Payer: Medicare Other | Source: Ambulatory Visit | Attending: Radiation Oncology | Admitting: Radiation Oncology

## 2019-03-08 DIAGNOSIS — C250 Malignant neoplasm of head of pancreas: Secondary | ICD-10-CM

## 2019-03-08 NOTE — Progress Notes (Signed)
  Radiation Oncology         [phone] ________________________________  Name: [name] MRN: [id number]  Date: 03/08/2019  DOB: 08/31/1935  Stereotactic Body Radiotherapy Treatment Procedure Note  NARRATIVE:  [name] was brought to the stereotactic radiation treatment machine and placed supine on the CT couch. The patient was set up for stereotactic body radiotherapy on the body fix pillow.  3D TREATMENT PLANNING AND DOSIMETRY:  The patient's radiation plan was reviewed and approved prior to starting treatment.  It showed 3-dimensional radiation distributions overlaid onto the planning CT.  The [facility] for the target structures as well as the organs at risk were reviewed. The documentation of this is filed in the radiation oncology EMR.  SIMULATION VERIFICATION:  The patient underwent CT imaging on the treatment unit.  These were carefully aligned to document that the ablative radiation dose would cover the target volume and maximally spare the nearby organs at risk according to the planned distribution.  SPECIAL TREATMENT PROCEDURE: [name] received high dose ablative stereotactic body radiotherapy to the planned target volume without unforeseen complications. Treatment was delivered uneventfully. The high doses associated with stereotactic body radiotherapy and the significant potential risks require careful treatment set up and patient monitoring constituting a special treatment procedure   STEREOTACTIC TREATMENT MANAGEMENT:  Following delivery, the patient was evaluated clinically. The patient tolerated treatment without significant acute effects, and was discharged to home in stable condition.    PLAN: Continue treatment as planned.  ________________________________  Blair Promise, PhD, MD   This document serves as a record of services personally performed by Gery Pray, MD. It was created on his behalf by Clerance Lav, a trained medical scribe. The  creation of this record is based on the scribe's personal observations and the provider's statements to them. This document has been checked and approved by the attending provider.

## 2019-03-08 NOTE — Progress Notes (Signed)
  Radiation Oncology         [phone] ________________________________  Name: [name] MRN: [id number]  Date: 03/10/2019  DOB: Nov 07, 1935  Stereotactic Body Radiotherapy Treatment Procedure Note  NARRATIVE:  [name] was brought to the stereotactic radiation treatment machine and placed supine on the CT couch. The patient was set up for stereotactic body radiotherapy on the body fix pillow.  3D TREATMENT PLANNING AND DOSIMETRY:  The patient's radiation plan was reviewed and approved prior to starting treatment.  It showed 3-dimensional radiation distributions overlaid onto the planning CT.  The [facility] for the target structures as well as the organs at risk were reviewed. The documentation of this is filed in the radiation oncology EMR.  SIMULATION VERIFICATION:  The patient underwent CT imaging on the treatment unit.  These were carefully aligned to document that the ablative radiation dose would cover the target volume and maximally spare the nearby organs at risk according to the planned distribution.  SPECIAL TREATMENT PROCEDURE: [name] received high dose ablative stereotactic body radiotherapy to the planned target volume without unforeseen complications. Treatment was delivered uneventfully. The high doses associated with stereotactic body radiotherapy and the significant potential risks require careful treatment set up and patient monitoring constituting a special treatment procedure   STEREOTACTIC TREATMENT MANAGEMENT:  Following delivery, the patient was evaluated clinically. The patient tolerated treatment without significant acute effects, and was discharged to home in stable condition.    PLAN: Continue treatment as planned.  ________________________________  Blair Promise, PhD, MD   This document serves as a record of services personally performed by Gery Pray, MD. It was created on his behalf by Clerance Lav, a trained medical scribe. The  creation of this record is based on the scribe's personal observations and the provider's statements to them. This document has been checked and approved by the attending provider.

## 2019-03-09 ENCOUNTER — Encounter: Payer: Self-pay | Admitting: General Practice

## 2019-03-09 NOTE — Progress Notes (Signed)
[facility]ual Care Note  Mailed Ms Can a handwritten pastoral note of encouragement.   [place], [place], [facility] Pager [phone] Voicemail [phone]

## 2019-03-10 ENCOUNTER — Other Ambulatory Visit: Payer: Self-pay

## 2019-03-10 ENCOUNTER — Ambulatory Visit
Admission: RE | Admit: 2019-03-10 | Discharge: 2019-03-10 | Disposition: A | Discharge status: Home / Self Care | Payer: Medicare Other | Source: Ambulatory Visit | Attending: Radiation Oncology | Admitting: Radiation Oncology

## 2019-03-10 DIAGNOSIS — C250 Malignant neoplasm of head of pancreas: Secondary | ICD-10-CM | POA: Diagnosis not present

## 2019-03-11 ENCOUNTER — Telehealth: Payer: Self-pay | Admitting: Family Medicine

## 2019-03-11 NOTE — Telephone Encounter (Signed)
TA-Patient called asking if you still want her to continue testing glucose/plz advise/thx dmf

## 2019-03-11 NOTE — Telephone Encounter (Signed)
If they have been stable, she does not need to keep checking them.

## 2019-03-11 NOTE — Telephone Encounter (Signed)
Patient is calling and wanting to know if you want her to continue testing her blood and if so she needs refill and testing supplies. Please call patient at [phone].

## 2019-03-11 NOTE — Telephone Encounter (Signed)
Pt aware/thx dmf 

## 2019-03-12 ENCOUNTER — Other Ambulatory Visit: Payer: Self-pay

## 2019-03-12 ENCOUNTER — Ambulatory Visit
Admission: RE | Admit: 2019-03-12 | Discharge: 2019-03-12 | Disposition: A | Discharge status: Home / Self Care | Payer: Medicare Other | Source: Ambulatory Visit | Attending: Radiation Oncology | Admitting: Radiation Oncology

## 2019-03-12 DIAGNOSIS — C250 Malignant neoplasm of head of pancreas: Secondary | ICD-10-CM | POA: Diagnosis not present

## 2019-03-15 NOTE — Progress Notes (Signed)
  Radiation Oncology         [phone] ________________________________  Name: [name] MRN: [id number]  Date: 03/16/2019  DOB: 03-19-1935  Stereotactic Body Radiotherapy Treatment Procedure Note  NARRATIVE:  CESLEY BRACCIA was brought to the stereotactic radiation treatment machine and placed supine on the CT couch. The patient was set up for stereotactic body radiotherapy on the body fix pillow.  3D TREATMENT PLANNING AND DOSIMETRY:  The patient's radiation plan was reviewed and approved prior to starting treatment.  It showed 3-dimensional radiation distributions overlaid onto the planning CT.  The [facility] for the target structures as well as the organs at risk were reviewed. The documentation of this is filed in the radiation oncology EMR.  SIMULATION VERIFICATION:  The patient underwent CT imaging on the treatment unit.  These were carefully aligned to document that the ablative radiation dose would cover the target volume and maximally spare the nearby organs at risk according to the planned distribution.  SPECIAL TREATMENT PROCEDURE: RONDI MCNETT received high dose ablative stereotactic body radiotherapy to the planned target volume without unforeseen complications. Treatment was delivered uneventfully. The high doses associated with stereotactic body radiotherapy and the significant potential risks require careful treatment set up and patient monitoring constituting a special treatment procedure   STEREOTACTIC TREATMENT MANAGEMENT:  Following delivery, the patient was evaluated clinically. The patient tolerated treatment without significant acute effects, and was discharged to home in stable condition.    PLAN: Continue treatment as planned.  ________________________________  Blair Promise, PhD, MD   This document serves as a record of services personally performed by Gery Pray, MD. It was created on his behalf by Clerance Lav, a trained medical scribe. The  creation of this record is based on the scribe's personal observations and the provider's statements to them. This document has been checked and approved by the attending provider.

## 2019-03-16 ENCOUNTER — Other Ambulatory Visit: Payer: Self-pay

## 2019-03-16 ENCOUNTER — Ambulatory Visit
Admission: RE | Admit: 2019-03-16 | Discharge: 2019-03-16 | Disposition: A | Discharge status: Home / Self Care | Payer: Medicare Other | Source: Ambulatory Visit | Attending: Radiation Oncology | Admitting: Radiation Oncology

## 2019-03-16 ENCOUNTER — Ambulatory Visit: Payer: Medicare Other | Admitting: Radiation Oncology

## 2019-03-16 DIAGNOSIS — C250 Malignant neoplasm of head of pancreas: Secondary | ICD-10-CM | POA: Diagnosis not present

## 2019-03-16 MED FILL — AMLODIPINE BESYLATE 5 MG TA: 5 | 90 days supply | Qty: 90 | Fill #1

## 2019-03-17 ENCOUNTER — Ambulatory Visit: Payer: Medicare Other | Admitting: Cardiovascular Disease

## 2019-03-17 MED ORDER — BLOOD GLUCOSE MONITOR KIT: PACK | 0 refills | Status: DC

## 2019-03-17 NOTE — Telephone Encounter (Signed)
Patient is requesting for Rx to be sent in for 90 day supply for the glucose machine as a back up.

## 2019-03-17 NOTE — Addendum Note (Signed)
Addended by: Marrion Coy on: 03/17/2019 04:39 PM   Modules accepted: Orders

## 2019-03-18 ENCOUNTER — Ambulatory Visit: Payer: Medicare Other | Admitting: Radiation Oncology

## 2019-03-18 ENCOUNTER — Ambulatory Visit
Admission: RE | Admit: 2019-03-18 | Discharge: 2019-03-18 | Disposition: A | Discharge status: Home / Self Care | Payer: Medicare Other | Source: Ambulatory Visit | Attending: Radiation Oncology | Admitting: Radiation Oncology

## 2019-03-18 ENCOUNTER — Other Ambulatory Visit: Payer: Self-pay

## 2019-03-18 ENCOUNTER — Encounter: Payer: Self-pay | Admitting: Radiation Oncology

## 2019-03-18 DIAGNOSIS — C250 Malignant neoplasm of head of pancreas: Secondary | ICD-10-CM | POA: Diagnosis not present

## 2019-03-18 NOTE — Progress Notes (Signed)
  Radiation Oncology         [phone] ________________________________  Name: RYLYNNE SAAM MRN: [id number]  Date: 03/18/2019  DOB: 29-Jan-1936  Stereotactic Body Radiotherapy Treatment Procedure Note  NARRATIVE:  MARYLOU PASKE was brought to the stereotactic radiation treatment machine and placed supine on the CT couch. The patient was set up for stereotactic body radiotherapy on the body fix pillow.  3D TREATMENT PLANNING AND DOSIMETRY:  The patient's radiation plan was reviewed and approved prior to starting treatment.  It showed 3-dimensional radiation distributions overlaid onto the planning CT.  The [facility] for the target structures as well as the organs at risk were reviewed. The documentation of this is filed in the radiation oncology EMR.  SIMULATION VERIFICATION:  The patient underwent CT imaging on the treatment unit.  These were carefully aligned to document that the ablative radiation dose would cover the target volume and maximally spare the nearby organs at risk according to the planned distribution.  SPECIAL TREATMENT PROCEDURE: JORLEY DELCARMEN received high dose ablative stereotactic body radiotherapy to the planned target volume without unforeseen complications. Treatment was delivered uneventfully. The high doses associated with stereotactic body radiotherapy and the significant potential risks require careful treatment set up and patient monitoring constituting a special treatment procedure   STEREOTACTIC TREATMENT MANAGEMENT:  Following delivery, the patient was evaluated clinically. The patient tolerated treatment without significant acute effects, and was discharged to home in stable condition.    PLAN: Continue treatment as planned.  ________________________________  Blair Promise, PhD, MD

## 2019-03-25 ENCOUNTER — Other Ambulatory Visit: Payer: Medicare Other | Admitting: *Deleted

## 2019-03-25 DIAGNOSIS — Z515 Encounter for palliative care: Secondary | ICD-10-CM

## 2019-03-26 ENCOUNTER — Other Ambulatory Visit: Payer: Self-pay

## 2019-03-29 ENCOUNTER — Other Ambulatory Visit: Payer: Self-pay | Admitting: Hematology and Oncology

## 2019-03-29 ENCOUNTER — Encounter: Payer: Self-pay | Admitting: Hematology and Oncology

## 2019-03-29 ENCOUNTER — Other Ambulatory Visit: Payer: Self-pay | Admitting: *Deleted

## 2019-03-29 ENCOUNTER — Inpatient Hospital Stay: Payer: Medicare Other | Attending: Hematology and Oncology | Admitting: Hematology and Oncology

## 2019-03-29 ENCOUNTER — Inpatient Hospital Stay: Payer: Medicare Other

## 2019-03-29 ENCOUNTER — Other Ambulatory Visit: Payer: Self-pay

## 2019-03-29 VITALS — BP 155/70 | HR 67 | Temp 98.1°F | Resp 16 | Ht <= 58 in | Wt 123.7 lb

## 2019-03-29 DIAGNOSIS — K219 Gastro-esophageal reflux disease without esophagitis: Secondary | ICD-10-CM | POA: Diagnosis not present

## 2019-03-29 DIAGNOSIS — Z8719 Personal history of other diseases of the digestive system: Secondary | ICD-10-CM | POA: Diagnosis not present

## 2019-03-29 DIAGNOSIS — Z833 Family history of diabetes mellitus: Secondary | ICD-10-CM | POA: Insufficient documentation

## 2019-03-29 DIAGNOSIS — Z880 Allergy status to penicillin: Secondary | ICD-10-CM | POA: Diagnosis not present

## 2019-03-29 DIAGNOSIS — Z853 Personal history of malignant neoplasm of breast: Secondary | ICD-10-CM | POA: Diagnosis not present

## 2019-03-29 DIAGNOSIS — Z79899 Other long term (current) drug therapy: Secondary | ICD-10-CM | POA: Insufficient documentation

## 2019-03-29 DIAGNOSIS — E119 Type 2 diabetes mellitus without complications: Secondary | ICD-10-CM | POA: Diagnosis not present

## 2019-03-29 DIAGNOSIS — Z885 Allergy status to narcotic agent status: Secondary | ICD-10-CM | POA: Diagnosis not present

## 2019-03-29 DIAGNOSIS — Z8616 Personal history of COVID-19: Secondary | ICD-10-CM | POA: Insufficient documentation

## 2019-03-29 DIAGNOSIS — G893 Neoplasm related pain (acute) (chronic): Secondary | ICD-10-CM | POA: Insufficient documentation

## 2019-03-29 DIAGNOSIS — R109 Unspecified abdominal pain: Secondary | ICD-10-CM | POA: Insufficient documentation

## 2019-03-29 DIAGNOSIS — K589 Irritable bowel syndrome without diarrhea: Secondary | ICD-10-CM | POA: Diagnosis not present

## 2019-03-29 DIAGNOSIS — C250 Malignant neoplasm of head of pancreas: Secondary | ICD-10-CM | POA: Insufficient documentation

## 2019-03-29 DIAGNOSIS — Z888 Allergy status to other drugs, medicaments and biological substances status: Secondary | ICD-10-CM | POA: Diagnosis not present

## 2019-03-29 DIAGNOSIS — Z8249 Family history of ischemic heart disease and other diseases of the circulatory system: Secondary | ICD-10-CM | POA: Insufficient documentation

## 2019-03-29 DIAGNOSIS — Z803 Family history of malignant neoplasm of breast: Secondary | ICD-10-CM | POA: Diagnosis not present

## 2019-03-29 DIAGNOSIS — G4733 Obstructive sleep apnea (adult) (pediatric): Secondary | ICD-10-CM | POA: Diagnosis not present

## 2019-03-29 DIAGNOSIS — R110 Nausea: Secondary | ICD-10-CM | POA: Insufficient documentation

## 2019-03-29 DIAGNOSIS — I10 Essential (primary) hypertension: Secondary | ICD-10-CM | POA: Insufficient documentation

## 2019-03-29 DIAGNOSIS — I2510 Atherosclerotic heart disease of native coronary artery without angina pectoris: Secondary | ICD-10-CM | POA: Diagnosis not present

## 2019-03-29 DIAGNOSIS — R6883 Chills (without fever): Secondary | ICD-10-CM | POA: Insufficient documentation

## 2019-03-29 DIAGNOSIS — R197 Diarrhea, unspecified: Secondary | ICD-10-CM | POA: Insufficient documentation

## 2019-03-29 DIAGNOSIS — C259 Malignant neoplasm of pancreas, unspecified: Secondary | ICD-10-CM

## 2019-03-29 DIAGNOSIS — Z823 Family history of stroke: Secondary | ICD-10-CM | POA: Insufficient documentation

## 2019-03-29 DIAGNOSIS — Z881 Allergy status to other antibiotic agents status: Secondary | ICD-10-CM | POA: Insufficient documentation

## 2019-03-29 DIAGNOSIS — Z8042 Family history of malignant neoplasm of prostate: Secondary | ICD-10-CM | POA: Diagnosis not present

## 2019-03-29 LAB — CBC WITH DIFFERENTIAL (CANCER CENTER ONLY)
Abs Immature Granulocytes: 0 10*3/uL (ref 0.00–0.07)
Basophils Absolute: 0.1 10*3/uL (ref 0.0–0.1)
Basophils Relative: 1 %
Eosinophils Absolute: 0.3 10*3/uL (ref 0.0–0.5)
Eosinophils Relative: 5 %
HCT: 38.1 % (ref 36.0–46.0)
Hemoglobin: 12.3 g/dL (ref 12.0–15.0)
Immature Granulocytes: 0 %
Lymphocytes Relative: 41 %
Lymphs Abs: 2.3 10*3/uL (ref 0.7–4.0)
MCH: 29.1 pg (ref 26.0–34.0)
MCHC: 32.3 g/dL (ref 30.0–36.0)
MCV: 90.1 fL (ref 80.0–100.0)
Monocytes Absolute: 0.6 10*3/uL (ref 0.1–1.0)
Monocytes Relative: 11 %
Neutro Abs: 2.3 10*3/uL (ref 1.7–7.7)
Neutrophils Relative %: 42 %
Platelet Count: 202 10*3/uL (ref 150–400)
RBC: 4.23 MIL/uL (ref 3.87–5.11)
RDW: 14.8 % (ref 11.5–15.5)
WBC Count: 5.6 10*3/uL (ref 4.0–10.5)
nRBC: 0 % (ref 0.0–0.2)

## 2019-03-29 LAB — CMP (CANCER CENTER ONLY)
ALT: 25 U/L (ref 0–44)
AST: 19 U/L (ref 15–41)
Albumin: 4.1 g/dL (ref 3.5–5.0)
Alkaline Phosphatase: 93 U/L (ref 38–126)
Anion gap: 7 (ref 5–15)
BUN: 8 mg/dL (ref 8–23)
CO2: 30 mmol/L (ref 22–32)
Calcium: 9.1 mg/dL (ref 8.9–10.3)
Chloride: 100 mmol/L (ref 98–111)
Creatinine: 0.93 mg/dL (ref 0.44–1.00)
GFR, Est AFR Am: >60 mL/min (ref >60)
GFR, Estimated: 58 mL/min — ABNORMAL LOW (ref >60)
Glucose, Bld: 152 mg/dL — ABNORMAL HIGH (ref 70–99)
Potassium: 4.3 mmol/L (ref 3.5–5.1)
Sodium: 137 mmol/L (ref 135–145)
Total Bilirubin: 0.8 mg/dL (ref 0.3–1.2)
Total Protein: 7.2 g/dL (ref 6.5–8.1)

## 2019-03-29 MED ORDER — BLOOD GLUCOSE MONITOR KIT: PACK | 0 refills | Status: DC

## 2019-03-29 NOTE — Progress Notes (Signed)
[place] [phone]   [phone]  PROGRESS NOTE  Patient Care Team: Lucille Passy, MD as PCP - General (Family Medicine) Josue Hector, MD as PCP - Cardiology (Cardiology) Neldon Mc, MD as Surgeon (General Surgery) Gery Pray, MD (Radiation Oncology) Magrinat, Virgie Dad, MD (Hematology and Oncology) Cameron Sprang, MD as Consulting Physician (Neurology) Placke, Dawn, RN (Inactive) as Registered Nurse Placke, Dawn, RN (Inactive) as Oncology Nurse Navigator  Hematological/Oncological History  # Pancreatic Adenocarcinoma Stage IA (T1N0M0) 1) 11/26/2018: patient had visit with PCP for pruritis, found to have elevated LFTs 2) 11/27/2018: patient presented to the ED. Underwent CT scan which revealed 1.9 x 1.6 x 1.6 cm cm mass in the head of the pancreas with biliary duct dilation 3) 11/28/2018: ERCP performed to place stent for biliary obstruction  4) 12/10/2018: EUS w/ FNA reveals adenocarcinoma of the pancreas 5) 12/18/2018: establish care with Dr. Lorenso Courier  6) 12/23/2018: surgical visit with Dr. Barry Dienes 7) 12/30/2018: radiation oncology visit with Dr. Sondra Come. Agreeable to SBRT to pancreatic mass.  8) 01/2019: Radiation delayed due to infection with COVID-19.  9) 03/08/2019-03/18/2019: Palliative radiation therapy to pancreatic mass  #Breast Cancer T2 N0, stage IIA invasive ductal carcinoma, grade 3,  ER+/PR+/HER-2 not amplified 1) December 2012:  leftlumpectomy and sentinel lymph node biopsy  2) March 2013: completed radiation therapy, started on letrozole 3) Feb 2018: completed letrozole therapy   HISTORY OF PRESENTING ILLNESS:  [name] 84 y.o. female with medical history significant for CAD s/p CABG in 2011, OSA on CPAP, Breast Cancer, and GERD who presents for a follow up for adenocarcinoma of the pancreas. She was last seen in our clinic on 03/01/2019.   In the interim [name] has undergone palliative radiation therapy  to the pancreas.  She notes that she did have some diarrhea following the radiation and continues to have the gnawing central abdominal pain.  She notes that she has not had the "spasms" in her abdomen in while.  She notes that her pain has been relatively well controlled with Tylenol 500 mg p.o. as needed.  She notes that she rarely takes more than 1/day.  She does have occasional nausea for which she chews ginger.  She does note that her appetite is overall pretty good and that she has only lost 1 pound since her last visit in early January.  She also endorses having occasional chills but has been otherwise well.  She and her husband have both recovered well from their Covid infection.  She and her daughter had no additional questions or concerns today.  A full 10 point ROS was otherwise negative.    MEDICAL HISTORY:  Past Medical History:  Diagnosis Date  . Asthma   . Atrophic vaginitis   . Breast cancer, Left 12/20/2010   NO BLOOD PRESSURE CHECKS OR STICKS IN LEFT ARM  . CAD (coronary artery disease)    a. 01/19/2010 s/p CABG x 3, lima->lad, vg->diag, vg->om1;  b. 06/2011 :Lexi MV: EF 84%, No ischemia. c. 01/06/14 s/p negative nuclear stress test with EF >70%  . Cervical arthritis   . Colonic polyp 04-27-2009   tubular adenoma  . Diverticulosis of colon (without mention of hemorrhage)   . Fibromyalgia   . Gallstones   . GERD (gastroesophageal reflux disease)   . Glaucoma   . Headache(784.0)    a. frequently assocaited with high BPs  . Hiatal hernia   . Hypercholesterolemia   . Irritable bowel syndrome   .  Labile hypertension   . Lymphedema    left arm  . Pancreatic cancer ([place])   . Pinched vertebral nerve   . Secondary diabetes ([place]) 12/03/2018    SURGICAL HISTORY: Past Surgical History:  Procedure Laterality Date  . [place] STUDY N/A 01/08/2016   Procedure: [place] STUDY;  Surgeon: Ladene Artist, MD;  Location: [facility] ENDOSCOPY;  Service: Endoscopy;  Laterality: N/A;  .  BILIARY BRUSHING  11/28/2018   Procedure: BILIARY BRUSHING;  Surgeon: Gatha Mayer, MD;  Location: [facility] ENDOSCOPY;  Service: Endoscopy;;  . BILIARY DILATION  01/06/2019   Procedure: BILIARY DILATION;  Surgeon: Irving Copas., MD;  Location: [facility] ENDOSCOPY;  Service: Endoscopy;;  . BILIARY STENT PLACEMENT  11/28/2018   Procedure: BILIARY STENT PLACEMENT;  Surgeon: Gatha Mayer, MD;  Location: [facility] ENDOSCOPY;  Service: Endoscopy;;  . BILIARY STENT PLACEMENT N/A 01/06/2019   Procedure: BILIARY STENT PLACEMENT;  Surgeon: Irving Copas., MD;  Location: [facility] ENDOSCOPY;  Service: Endoscopy;  Laterality: N/A;  . BREAST LUMPECTOMY Left 02/06/11  . CATARACT EXTRACTION, BILATERAL     bilateral caaract removal,  . COLONOSCOPY    . CORONARY ANGIOPLASTY WITH STENT PLACEMENT     Stent 2007  . CORONARY ARTERY BYPASS GRAFT    . ENDOSCOPIC RETROGRADE CHOLANGIOPANCREATOGRAPHY (ERCP) WITH PROPOFOL N/A 01/06/2019   Procedure: ENDOSCOPIC RETROGRADE CHOLANGIOPANCREATOGRAPHY (ERCP) WITH PROPOFOL;  Surgeon: Rush Landmark Telford Nab., MD;  Location: [facility] ENDOSCOPY;  Service: Endoscopy;  Laterality: N/A;  [id number]  . ERCP N/A 11/28/2018   Procedure: ENDOSCOPIC RETROGRADE CHOLANGIOPANCREATOGRAPHY (ERCP);  Surgeon: Gatha Mayer, MD;  Location[facility] ENDOSCOPY;  Service: Endoscopy;  Laterality: N/A;  . ESOPHAGEAL MANOMETRY N/A 01/08/2016   Procedure: ESOPHAGEAL MANOMETRY (EM);  Surgeon: Ladene Artist, MD;  Location: [facility] ENDOSCOPY;  Service: Endoscopy;  Laterality: N/A;  . ESOPHAGOGASTRODUODENOSCOPY    . ESOPHAGOGASTRODUODENOSCOPY N/A 01/06/2019   Procedure: ESOPHAGOGASTRODUODENOSCOPY (EGD);  Surgeon: Rush Landmark Telford Nab., MD;  Location: [facility] END[facility];  Service: Endoscopy;  Laterality: N/A;  . ESOPHAGOGASTRODUODENOSCOPY (EGD) WITH PROPOFOL N/A 12/10/2018   Procedure: ESOPHAGOGASTRODUODENOSCOPY (EGD) WITH PROPOFOL;  Surgeon: [name], MD;  Location: [facility] ENDOSCOPY;  Service: Endoscopy;  Laterality: N/A;  .  EUS N/A 12/10/2018   Procedure: UPPER ENDOSCOPIC ULTRASOUND (EUS) RADIAL;  Surgeon: [name], MD;  Location: [facility] ENDOSCOPY;  Service: Endoscopy;  Laterality: N/A;  . EUS N/A 01/06/2019   Procedure: FULL UPPER ENDOSCOPIC ULTRASOUND (EUS) RADIAL;  Surgeon: Rush Landmark Telford Nab., MD;  Location: [facility] ENDOSCOPY;  Service: Endoscopy;  Laterality: N/A;  . FIDUCIAL MARKER PLACEMENT  01/06/2019   Procedure: FIDUCIAL MARKER PLACEMENT;  Surgeon: Rush Landmark Telford Nab., MD;  Location: [facility] ENDOSCOPY;  Service: Endoscopy;;  . FINE NEEDLE ASPIRATION N/A 12/10/2018   Procedure: FINE NEEDLE ASPIRATION (FNA) LINEAR;  Surgeon: [name], MD;  Location: [facility] ENDOSCOPY;  Service: Endoscopy;  Laterality: N/A;  . SPHINCTEROTOMY  01/06/2019   Procedure: SPHINCTEROTOMY;  Surgeon: Rush Landmark Telford Nab., MD;  Location: [facility] ENDOSCOPY;  Service: Endoscopy;;  . STENT REMOVAL  01/06/2019   Procedure: STENT REMOVAL;  Surgeon: Irving Copas., MD;  Location: [facility] ENDOSCOPY;  Service: Endoscopy;;    SOCIAL HISTORY: Social History   Socioeconomic History  . Marital status: Married    Spouse name: Gildardo Griffes. Ferdinand  . Number of children: 2  . Years of education: 44  . Highest education level: Not on file  Occupational History  . Occupation: retired    Fish farm manager: [employer]  Tobacco Use  .  Smoking status: Never Smoker  . Smokeless tobacco: Never Used  Substance and Sexual Activity  . Alcohol use: No    Alcohol/week: 0.0 standard drinks  . Drug use: No  . Sexual activity: Not Currently    Partners: Male    Birth control/protection: Post-menopausal  Other Topics Concern  . Not on file  Social History Narrative   Patient lives at home with her husband Marcello Moores). 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    Caffeine- sometimes- One cup of coffee.   Right handed.   Two daughters   Four granddaughters.   No EtOH, Tobacco, drugs   Social  Determinants of Health   Financial Resource Strain:   . Difficulty of Paying Living Expenses: Not on file  Food Insecurity:   . Worried About Charity fundraiser in the Last Year: Not on file  . Ran Out of Food in the Last Year: Not on file  Transportation Needs:   . Lack of Transportation (Medical): Not on file  . Lack of Transportation (Non-Medical): Not on file  Physical Activity:   . Days of Exercise per Week: Not on file  . Minutes of Exercise per Session: Not on file  Stress:   . Feeling of Stress : Not on file  Social Connections:   . Frequency of Communication with Friends and Family: Not on file  . Frequency of Social Gatherings with Friends and Family: Not on file  . Attends Religious Services: Not on file  . Active Member of Clubs or Organizations: Not on file  . Attends Archivist Meetings: Not on file  . Marital Status: Not on file  Intimate Partner Violence:   . Fear of Current or Ex-Partner: Not on file  . Emotionally Abused: Not on file  . Physically Abused: Not on file  . Sexually Abused: Not on file    FAMILY HISTORY: Family History  Problem Relation Age of Onset  . Stroke Mother   . Stroke Father   . Prostate cancer Father   . Breast cancer Sister   . Diabetes Sister   . Cancer Brother        gland cancer  . Heart disease Brother   . Colon cancer Neg Hx   . Stomach cancer Neg Hx   . Pancreatic cancer Neg Hx   . Kidney disease Neg Hx   . Liver disease Neg Hx     ALLERGIES:  is allergic to levaquin [levofloxacin in d5w]; choline fenofibrate; hctz [hydrochlorothiazide]; simvastatin; adhesive [tape]; bentyl [dicyclomine hcl]; ceclor [cefaclor]; clarithromycin; codeine; doxycycline; hydrocodone; lisinopril; penicillins; and tobramycin-dexamethasone.  MEDICATIONS:  Current Outpatient Medications  Medication Sig Dispense Refill  . acetaminophen (TYLENOL) 650 MG CR tablet Take 650 mg by mouth every 8 (eight) hours as needed for pain.    [name]  albuterol (VENTOLIN HFA) 108 (90 Base) MCG/ACT inhaler Inhale 2 puffs into the lungs every 6 (six) hours as needed for wheezing or shortness of breath.    [name] amLODipine (NORVASC) 5 MG tablet Take 1 tablet (5 mg total) by mouth daily. 90 tablet 3  . aspirin EC 81 MG tablet Take 81 mg by mouth daily.    . blood glucose meter kit and supplies KIT Dispense based on patient and insurance preference.Check fasting blood sugars daily (FOR ICD-9 250.00, 250.01). 1 each 0  . CALCIUM PO Take 1 tablet by mouth daily with breakfast.     . diphenhydrAMINE (  BENADRYL) 50 MG tablet Take 0.5 tablets (25 mg total) by mouth 2 (two) times daily. (Patient not taking: Reported on 03/29/2019) 30 tablet 0  . EPINEPHrine 0.3 mg/0.3 mL IJ SOAJ injection Inject 0.3 mg into the muscle as needed (as directed- FOR A SEVERE REACTION).     [name] fexofenadine (ALLEGRA) 180 MG tablet Take 180 mg by mouth every other day.     . fluticasone (FLONASE) 50 MCG/ACT nasal spray Place 1-2 sprays into both nostrils daily as needed for allergies or rhinitis.    . Glucosamine-Chondroit-Vit C-Mn (GLUCOSAMINE CHONDR 1500 COMPLX PO) Take 1 tablet by mouth 2 (two) times daily.     . hydrALAZINE (APRESOLINE) 50 MG tablet Take 0.5 tablets (25 mg total) by mouth 3 (three) times daily. (Patient taking differently: Take 25 mg by mouth daily. ) 135 tablet 1  . latanoprost (XALATAN) 0.005 % ophthalmic solution Place 1 drop into both eyes at bedtime.     [name] losartan (COZAAR) 100 MG tablet TAKE 1/2 TABLET BY MOUTH THREE TIMES A DAY (Patient taking differently: Take 50 mg by mouth daily. ) 135 tablet 0  . metoprolol tartrate (LOPRESSOR) 50 MG tablet Take 1 tablet (50 mg total) by mouth 3 (three) times daily. 270 tablet 3  . nitroGLYCERIN (NITROSTAT) 0.4 MG SL tablet Place 1 tablet (0.4 mg total) under the tongue every 5 (five) minutes as needed for chest pain. 25 tablet 2  . pantoprazole (PROTONIX) 40 MG tablet Take 1 tablet (40 mg total) by mouth 2 (two) times daily.  Take 30-60 minutes before breakfast 60 tablet 11  . potassium chloride SA (KLOR-CON) 20 MEQ tablet Take 1 tablet (20 mEq total) by mouth daily. (Patient not taking: Reported on 03/29/2019) 14 tablet 0  . Psyllium (METAMUCIL FIBER PO) Take 1 Scoop by mouth daily. Mix one rounded teaspoonful into 8 oz water and drink once a day    . rosuvastatin (CRESTOR) 5 MG tablet TAKE 1 TABLET BY MOUTH 3 TIMES A WEEK. (TAKE ON MONDAY, WEDNESDAY, AND FRIDAY) (Patient taking differently: Take 5 mg by mouth every Monday, Wednesday, and Friday. ) 30 tablet 5  . sodium chloride (OCEAN) 0.65 % SOLN nasal spray Place 1 spray into both nostrils daily as needed for congestion.     . sucralfate (CARAFATE) 1 g tablet TAKE 1 TABLET BY MOUTH BY MOUTH TWICE DAILY(ONE BEFORE DINNER) 60 tablet 1  . traMADol (ULTRAM) 50 MG tablet Take 1/2 to 1 tablet by mouth three times daily as needed (Patient taking differently: Take 25-80 mg by mouth every 6 (six) hours as needed for moderate pain. ) 30 tablet 1  . vitamin B-12 (CYANOCOBALAMIN) 1000 MCG tablet Take 1,000 mcg by mouth daily.      [name] VITAMIN D PO Take 200 mcg by mouth daily with breakfast. Vitamin A  300 mcg Calcium  40 mcg     No current facility-administered medications for this visit.    REVIEW OF SYSTEMS:   Constitutional: ( - ) fevers, ( - )  chills , ( - ) night sweats Eyes: ( - ) blurriness of vision, ( - ) double vision, ( - ) watery eyes Ears, nose, mouth, throat, and face: ( - ) mucositis, ( - ) sore throat Respiratory: ( - ) cough, ( - ) dyspnea, ( - ) wheezes Cardiovascular: ( - ) palpitation, ( - ) chest discomfort, ( - ) lower extremity swelling Gastrointestinal:  ( - ) nausea, ( - ) heartburn, ( - )  change in bowel habits Skin: ( - ) abnormal skin rashes Lymphatics: ( - ) new lymphadenopathy, ( - ) easy bruising Neurological: ( - ) numbness, ( - ) tingling, ( - ) new weaknesses Behavioral/Psych: ( - ) mood change, ( - ) new changes  All other systems were  reviewed with the patient and are negative.  PHYSICAL EXAMINATION: ECOG PERFORMANCE STATUS: 2 - Symptomatic, <50% confined to bed  Vitals:   03/29/19 1457  BP: (!) 155/70  Pulse: 67  Resp: 16  Temp: 98.1 F (36.7 C)  SpO2: 97%   Filed Weights   03/29/19 1457  Weight: 123 lb 11.2 oz (56.1 kg)    GENERAL: pleasant, well appearing elderly Caucasian female in NAD  SKIN: skin color, texture, turgor are normal, no rashes or significant lesions EYES: conjunctiva are pink and non-injected. No jaundice evident in the sclera.  LUNGS: clear to auscultation and percussion with normal breathing effort HEART: regular rate & rhythm and no murmurs and no lower extremity edema ABDOMEN: mild tenderness in the center of the abdomen, just below the xyphoid process.  Musculoskeletal: no cyanosis of digits and no clubbing  PSYCH: alert & oriented x 3, fluent speech NEURO: no focal motor/sensory deficits  LABORATORY DATA:  I have reviewed the data as listed CMP Latest Ref Rng & Units 03/29/2019 03/01/2019 01/13/2019  Glucose 70 - 99 mg/dL 152(H) 131(H) -  BUN 8 - 23 mg/dL 8 15 -  Creatinine 0.44 - 1.00 mg/dL 0.93 0.55 -  Sodium 135 - 145 mmol/L 137 135 -  Potassium 3.5 - 5.1 mmol/L 4.3 4.8 -  Chloride 98 - 111 mmol/L 100 95(L) -  CO2 22 - 32 mmol/L 30 30 -  Calcium 8.9 - 10.3 mg/dL 9.1 9.2 -  Total Protein 6.5 - 8.1 g/dL 7.2 7.6 7.1  Total Bilirubin 0.3 - 1.2 mg/dL 0.8 1.4(H) 2.0(H)  Alkaline Phos 38 - 126 U/L 93 94 219(H)  AST 15 - 41 U/L '19 29 25  '$ ALT 0 - 44 U/L 25 38 41(H)   CBC Latest Ref Rng & Units 03/29/2019 03/01/2019 01/04/2019  WBC 4.0 - 10.5 K/uL 5.6 9.3 7.7  Hemoglobin 12.0 - 15.0 g/dL 12.3 12.2 10.3(L)  Hematocrit 36.0 - 46.0 % 38.1 37.5 29.6(L)  Platelets 150 - 400 K/uL 202 239 398    RADIOGRAPHIC STUDIES: No new interval imaging since last visit.  ASSESSMENT & PLAN SCHERRIE SENECA 84 y.o. female with medical history significant for CAD s/p CABG in 2011, OSA on CPAP, Breast  Cancer, and GERD who presents for a follow up for adenocarcinoma of the pancreas.  After review the labs and discussion with [name] her situation is currently stable.  Her bilirubin and LFTs are within normal limits.  Her CBC is also perfectly normal.  Symptomatically she is continuing to have pain but this is well controlled with as needed Tylenol 500 mg.  At this time there is no need for a change in her current plan.  I noted that in the event that her pain worsened we would be willing to prescribe oxycodone.  She noted she would want to avoid this for the time being.  Additionally if she had any other symptoms that were to occur in the meantime we would certainly consider medical intervention.  She knows to call us in the event that she does develop any new symptoms of concern.  At this time we will continue with symptom management and close monitoring of her  labs today give her a better idea of life expectancy/progression of disease. We will see her back in 4 weeks time for continued monitoring.  #Recently Diagnosed Adenocarcinoma of the Pancreas, Stage IA (T1N0M0) --Completed palliatve radiation therapy on 03/18/2019  --after reviewing patients functional status and ECOG 2 I think the patient is possible a candidate for chemotherapy (likely only monotherapy gemcitabine (J Clin Oncol. 1997 Jun; 15(6):2403-13.)). This could be considered after radiatiation, though I do not favor that approach. The patient and her daughter are OK with continued comfort based care.  --for now continued supportive care and symptom management.  --RTC in 4 weeks to assess symptoms.   #Breast Cancer T2 N0, stage IIA invasive ductal carcinoma, grade 3,  ER+/PR+/HER-2 not amplified --completed 84 year old letrozole therapy in Feb 2018 --previously followed by Dr. Gunnar Bulla Magrinat [facility] --in remission   #Cancer Related Pain --reports improvement in pain today, still across the top of the abdomen. --now taking '500mg'$   tylenol PRN --LFTs have normalized, OK to take tylenol at above dosage --will continue to monitor for abdominal pain  #Symptom Management --notes light colored stools that float, but no diarrhea. Possible initial signs of pancreatic insufficiency. Continue to monitor and consider pancreatic enzymes vs low fat diet if progressive. --no itching or jaundice today. Continue to monitor. If worsening, could be sign of stent obstruction.  --mild nausea, at patient's baseline. No diarrhea  #Goals Of Care --patient notes she wants to maintain a high quality of life and does not think that surgery/chemotherapy would allow that. I agree with her assessment --referred to [place] care for introductions, assistance with symptoms management, and preliminary hospice discussions.   All questions were answered. The patient knows to call the clinic with any problems, questions or concerns.  A total of more than 30 minutes were spent face-to-face with the patient during this encounter and over half of that time was spent on counseling and coordination of care as outlined above.   Ledell Peoples, MD Department of Hematology/Oncology [facility] Phone: [phone] Pager: [phone] Email: [email or web address]   03/29/2019 3:25 PM   Literature Support:  Nyra Capes, Switchenko Hewitt Shorts RJ, [place], [place] T, et al. Outcomes for patients with locally advanced pancreatic adenocarcinoma treated with stereotactic body radiation therapy versus conventionally fractionated radiation. Cancer. 2017;123(18):3486-93.  --Among 8450 patients, 7819 (92.5%) were treated with CFRT, and 631 (7.5%) underwent SBRT. Receipt of SBRT was associated with superior OS in the multivariate analysis (hazard ratio, 0.84; 95% confidence interval, 0.75?0.93; P?<?.001). With propensity score matching, 988 patients in all were matched, with 494 patients in  each cohort. Within the propensity?matched cohorts, the median OS (13.9 vs 11.6 months) and the 2?year OS rate (21.7% vs 16.5%) were significantly higher with SBRT versus CFRT (P?=?.0014).

## 2019-03-30 ENCOUNTER — Telehealth: Payer: Self-pay | Admitting: Neurology

## 2019-03-30 NOTE — Progress Notes (Signed)
COMMUNITY PALLIATIVE CARE RN NOTE  PATIENT NAME: [name] DOB: Jul 01, 1935 MRN: [id number]  PRIMARY CARE PROVIDER: Lucille Passy, MD  RESPONSIBLE PARTY:  Acct ID - Guarantor Home Phone Work Phone Relationship Acct Type  [id number] [name]  Self P/F     [address], [place], [place] [address]   Due to the COVID-19 crisis, this virtual check-in visit was done via telephone from my office and it was initiated and consent by this patient and or family.  PLAN OF CARE and INTERVENTION:  1. ADVANCE CARE PLANNING/GOALS OF CARE: Goal is for patient to remain at home with her husband.  2. PATIENT/CAREGIVER EDUCATION: N/A 3. DISEASE STATUS: Virtual check-in visit completed via telephone. Patient denies pain at this time. She will have occasional pain from an old back injury. She recently completed radiation therapy for her pancreatic cancer last week. She had a total of 5 radiation treatments. She tolerated them well. She is maintaining her strength. She remains ambulatory and able to perform all ADLs independently. Her appetite is fair, but not as good as it used to be. She has lost some weight, but is unsure of how much. She drinks fluids well. She denies any nausea and no dysphagia. She takes all of her medications without difficulty. They still travel outside of the home at times. She is having dinner with her daughter tonight for her grand-daughter's 18th birthday. She did test positive for Covid-19 in December 2020, but remained asymptomatic. She has another scan scheduled for next month. Overall, she feels that she is stable at this time. Will continue to monitor.    HISTORY OF PRESENT ILLNESS:  This is a 84 yo female who resides at home with her husband. Palliative care team continues to follow patient. Team to continue to visit monthly and PRN.  CODE STATUS: Full Code ADVANCED DIRECTIVES: N MOST FORM: no PPS: 50%   (Duration of visit and documentation 45  minutes)   Daryl Eastern, RN BSN

## 2019-03-30 NOTE — Telephone Encounter (Signed)
Daughter is calling in that patient has pancreatic cancer and they have given her a year to live. She is scheduled for an appt 04/13/19 and we had called to flip to VV. But patient's daughter is wanting to find out if this is necessary with all the recent news of patient. Plus let us know and we can call daughter about appt. Thanks!

## 2019-03-30 NOTE — Telephone Encounter (Signed)
We can certainly just do as needed visits, pls let daughter know I am thinking of the patient and that if there is anything we can do, to reach out. Thanks

## 2019-03-31 ENCOUNTER — Telehealth: Payer: Self-pay | Admitting: Hematology and Oncology

## 2019-03-31 NOTE — Telephone Encounter (Signed)
Scheduled per los. Called and spoke with patients daughter. Confirmed appt  °

## 2019-03-31 NOTE — Progress Notes (Incomplete)
  Patient Name: [name] MRN: [id number] DOB: 1935/09/08 Referring Physician: Stark Klein (Profile Not Attached) Date of Service: 03/18/2019 [facility], [place]                                                        End Of Treatment Note  Diagnoses: C25.0-Malignant neoplasm of head of pancreas  Cancer Staging: Adenocarcinoma of the pancreatic head, uT2N1  Intent: Curative  Radiation Treatment Dates: 03/08/2019 through 03/18/2019 Site Technique Total Dose (Gy) Dose per Fx (Gy) Completed Fx Beam Energies  Pancreas: Pancreas IMRT 33/33 6.6 5/5 6XFFF   Narrative: The patient tolerated radiation therapy relatively well. The patient reported some moderate fatigue throughout treatment. She denied constipation and hematuria. Skin was intact with no irritation at treatment site.  Plan: The patient will follow-up with radiation oncology in one month.  ________________________________________________   Blair Promise, PhD, MD  This document serves as a record of services personally performed by Gery Pray, MD. It was created on his behalf by Clerance Lav, a trained medical scribe. The creation of this record is based on the scribe's personal observations and the provider's statements to them. This document has been checked and approved by the attending provider.

## 2019-04-06 ENCOUNTER — Other Ambulatory Visit: Payer: Medicare Other | Admitting: Licensed Clinical Social Worker

## 2019-04-06 ENCOUNTER — Other Ambulatory Visit: Payer: Self-pay

## 2019-04-06 ENCOUNTER — Telehealth: Payer: Self-pay

## 2019-04-06 DIAGNOSIS — Z515 Encounter for palliative care: Secondary | ICD-10-CM

## 2019-04-06 NOTE — Telephone Encounter (Signed)
Pt called asking if she still needed to check her blood glucose at home.

## 2019-04-07 NOTE — Progress Notes (Signed)
COMMUNITY PALLIATIVE CARE SW NOTE  PATIENT NAME: [name] DOB: 1936-02-23 MRN: [id number]  PRIMARY CARE PROVIDER: Lucille Passy, MD  RESPONSIBLE PARTY:  Acct ID - Guarantor Home Phone Work Phone Relationship Acct Type  [id number] [name] [phone]  Self P/F     [address], [place] [address]   Due to the COVID-19 crisis, this virtual check-in visit was done via telephone from my office and it was initiated and consent by this patientand orfamily.  PLAN OF CARE and INTERVENTIONS:             1. GOALS OF CARE/ ADVANCE CARE PLANNING:  Patient's goal is not to be hospitalized.  She is a full code. 2. SOCIAL/EMOTIONAL/SPIRITUAL ASSESSMENT/ INTERVENTIONS:  SW conducted a Sales executive visit with patient's daughter, [name].  She reports her parents have both recovered from Bertha.  The patient's daughters no longer are staying with her, but visit daily.  Patient's husband can also drive again, which is helpful.  [name] reports that the family continues to rely heavily on their faith.  SW provided active listening and supportive counseling. 3. PATIENT/CAREGIVER EDUCATION/ COPING:  Patient copes by problem-solving. 4. PERSONAL EMERGENCY PLAN:  Family will contact EMS. 5. COMMUNITY RESOURCES COORDINATION/ HEALTH CARE NAVIGATION:  None. 6. FINANCIAL/LEGAL CONCERNS/INTERVENTIONS:  None.     SOCIAL HX:  Social History   Tobacco Use  . Smoking status: Never Smoker  . Smokeless tobacco: Never Used  Substance Use Topics  . Alcohol use: No    Alcohol/week: 0.0 standard drinks    CODE STATUS:  Full Code  ADVANCED DIRECTIVES: N MOST FORM COMPLETE:  N HOSPICE EDUCATION PROVIDED:  No PPS:  Patient's appetite is normal.  She walks independently. Duration of visit and documentation:  30 minutes.      [name] [name] Leanos, LCSW

## 2019-04-08 ENCOUNTER — Ambulatory Visit: Payer: Medicare Other | Admitting: Neurology

## 2019-04-18 NOTE — Progress Notes (Signed)
Radiation Oncology         ([phone] ________________________________  Name: [name] MRN: [id number]  Date: 04/19/2019  DOB: 1935-06-01  Follow-Up Visit Note  CC: Lucille Passy, MD  Stark Klein, MD    ICD-10-CM   1. Adenocarcinoma of head of pancreas ([place])  C25.0     Diagnosis: Adenocarcinoma of the pancreatic head,uT2N1  Interval Since Last Radiation: One month and one day.   Radiation Treatment Dates: 03/08/2019 through 03/18/2019 Site Technique Total Dose (Gy) Dose per Fx (Gy) Completed Fx Beam Energies  Pancreas: Pancreas IMRT 33/33 6.6 5/5 6XFFF    Narrative:  The patient returns today for routine follow-up. Since the end of treatment, she followed up with Dr. Lorenso Courier on 03/29/2019, during which time they discussed chemotherapy versus comfort based care. They agreed on continued supportive care and symptom management at that time.  On review of systems, the patient reports persistent fatigue this issue has improved. The patient denies problems with diarrhea.  Patient has occasional abdominal bloating depending on food intake.  She has minimal abdominal/back pain for which she takes a Tylenol.  She had blood work earlier this month with Dr. Libby Maw follow-up showing no issues  ALLERGIES:  is allergic to levaquin [levofloxacin in d5w]; choline fenofibrate; hctz [hydrochlorothiazide]; simvastatin; adhesive [tape]; bentyl [dicyclomine hcl]; ceclor [cefaclor]; clarithromycin; codeine; doxycycline; hydrocodone; lisinopril; penicillins; and tobramycin-dexamethasone.  Meds: Current Outpatient Medications  Medication Sig Dispense Refill  . acetaminophen (TYLENOL) 650 MG CR tablet Take 650 mg by mouth every 8 (eight) hours as needed for pain.    [name] albuterol (VENTOLIN HFA) 108 (90 Base) MCG/ACT inhaler Inhale 2 puffs into the lungs every 6 (six) hours as needed for wheezing or shortness of breath.    [name] amLODipine (NORVASC) 5 MG tablet Take 1 tablet (5 mg total) by mouth  daily. 90 tablet 3  . aspirin EC 81 MG tablet Take 81 mg by mouth daily.    . blood glucose meter kit and supplies KIT Dispense based on patient and insurance preference.Check fasting blood sugars daily (FOR ICD-9 250.00, 250.01). 1 each 0  . CALCIUM PO Take 1 tablet by mouth daily with breakfast.     . EPINEPHrine 0.3 mg/0.3 mL IJ SOAJ injection Inject 0.3 mg into the muscle as needed (as directed- FOR A SEVERE REACTION).     [name] fexofenadine (ALLEGRA) 180 MG tablet Take 180 mg by mouth every other day.     . fluticasone (FLONASE) 50 MCG/ACT nasal spray Place 1-2 sprays into both nostrils daily as needed for allergies or rhinitis.    . Glucosamine-Chondroit-Vit C-Mn (GLUCOSAMINE CHONDR 1500 COMPLX PO) Take 1 tablet by mouth 2 (two) times daily.     . hydrALAZINE (APRESOLINE) 50 MG tablet Take 0.5 tablets (25 mg total) by mouth 3 (three) times daily. (Patient taking differently: Take 25 mg by mouth daily. ) 135 tablet 1  . latanoprost (XALATAN) 0.005 % ophthalmic solution Place 1 drop into both eyes at bedtime.     [name] losartan (COZAAR) 100 MG tablet TAKE 1/2 TABLET BY MOUTH THREE TIMES A DAY (Patient taking differently: Take 50 mg by mouth daily. ) 135 tablet 0  . metoprolol tartrate (LOPRESSOR) 50 MG tablet Take 1 tablet (50 mg total) by mouth 3 (three) times daily. 270 tablet 3  . nitroGLYCERIN (NITROSTAT) 0.4 MG SL tablet Place 1 tablet (0.4 mg total) under the tongue every 5 (five) minutes as needed for chest pain. 25 tablet 2  .  pantoprazole (PROTONIX) 40 MG tablet Take 1 tablet (40 mg total) by mouth 2 (two) times daily. Take 30-60 minutes before breakfast 60 tablet 11  . Psyllium (METAMUCIL FIBER PO) Take 1 Scoop by mouth daily. Mix one rounded teaspoonful into 8 oz water and drink once a day    . rosuvastatin (CRESTOR) 5 MG tablet TAKE 1 TABLET BY MOUTH 3 TIMES A WEEK. (TAKE ON MONDAY, WEDNESDAY, AND FRIDAY) (Patient taking differently: Take 5 mg by mouth every Monday, Wednesday, and Friday. ) 30  tablet 5  . sodium chloride (OCEAN) 0.65 % SOLN nasal spray Place 1 spray into both nostrils daily as needed for congestion.     . sucralfate (CARAFATE) 1 g tablet TAKE 1 TABLET BY MOUTH BY MOUTH TWICE DAILY(ONE BEFORE DINNER) 60 tablet 1  . traMADol (ULTRAM) 50 MG tablet Take 1/2 to 1 tablet by mouth three times daily as needed (Patient taking differently: Take 25-80 mg by mouth every 6 (six) hours as needed for moderate pain. ) 30 tablet 1  . vitamin B-12 (CYANOCOBALAMIN) 1000 MCG tablet Take 1,000 mcg by mouth daily.      [name] VITAMIN D PO Take 200 mcg by mouth daily with breakfast. Vitamin A  300 mcg Calcium  40 mcg    . diphenhydrAMINE (BENADRYL) 50 MG tablet Take 0.5 tablets (25 mg total) by mouth 2 (two) times daily. (Patient not taking: Reported on 03/29/2019) 30 tablet 0  . potassium chloride SA (KLOR-CON) 20 MEQ tablet Take 1 tablet (20 mEq total) by mouth daily. (Patient not taking: Reported on 03/29/2019) 14 tablet 0   No current facility-administered medications for this encounter.    Physical Findings: The patient is in no acute distress. Patient is alert and oriented.  weight is 123 lb 6.4 oz (56 kg). Her temperature is 97.8 F (36.6 C). Her blood pressure is 133/62 and her pulse is 57 (abnormal). Her respiration is 18 and oxygen saturation is 96%. .   Lungs are clear to auscultation bilaterally. Heart has regular rate and rhythm. No palpable cervical, supraclavicular, or axillary adenopathy. Abdomen soft, non-tender, normal bowel sounds.  No palpable epigastric mass.   Lab Findings: Lab Results  Component Value Date   WBC 5.6 03/29/2019   HGB 12.3 03/29/2019   HCT 38.1 03/29/2019   MCV 90.1 03/29/2019   PLT 202 03/29/2019    Radiographic Findings: No results found.  Impression: Adenocarcinoma of the pancreatic head,uT2N1  The patient is recovering from the effects of radiation.  She is now ready to proceed with imaging and will order a CT scan of the abdomen and  pelvis.  Plan: Follow-up with radiation oncology in three months.  Patient is scheduled to see Dr. Lorenso Courier on 04/26/2019.  ____________________________________   Blair Promise, PhD, MD  This document serves as a record of services personally performed by Gery Pray, MD. It was created on his behalf by Clerance Lav, a trained medical scribe. The creation of this record is based on the scribe's personal observations and the provider's statements to them. This document has been checked and approved by the attending provider.

## 2019-04-19 ENCOUNTER — Ambulatory Visit
Admission: RE | Admit: 2019-04-19 | Discharge: 2019-04-19 | Disposition: A | Discharge status: Home / Self Care | Payer: Medicare Other | Source: Ambulatory Visit | Attending: Radiation Oncology | Admitting: Radiation Oncology

## 2019-04-19 ENCOUNTER — Other Ambulatory Visit: Payer: Self-pay

## 2019-04-19 ENCOUNTER — Encounter: Payer: Self-pay | Admitting: Radiation Oncology

## 2019-04-19 VITALS — BP 133/62 | HR 57 | Temp 97.8°F | Resp 18 | Wt 123.4 lb

## 2019-04-19 DIAGNOSIS — C250 Malignant neoplasm of head of pancreas: Secondary | ICD-10-CM | POA: Diagnosis not present

## 2019-04-19 DIAGNOSIS — Z79899 Other long term (current) drug therapy: Secondary | ICD-10-CM | POA: Insufficient documentation

## 2019-04-19 DIAGNOSIS — Z923 Personal history of irradiation: Secondary | ICD-10-CM | POA: Insufficient documentation

## 2019-04-19 DIAGNOSIS — Z7982 Long term (current) use of aspirin: Secondary | ICD-10-CM | POA: Insufficient documentation

## 2019-04-19 NOTE — Progress Notes (Signed)
[name] presents today for f/u with Dr. Sondra Come. Pt is accompanied by daughter, [name]. Pt reports a couple weeks ago, pt felt like "stomach rolled like a deflated ball". Pt reports fatigue is lingering. Pt reports nausea, relieved by ginger. Denies vomiting. Pt denies any recent diarrhea. Pt reports occasional abdominal bloating and cramping "its according to what I eat and I chew gas-ex".   BP 133/62 (BP Location: Right Arm, Patient Position: Sitting, Cuff Size: Normal)   Pulse (!) 57   Temp 97.8 F (36.6 C)   Resp 18   Wt 123 lb 6.4 oz (56 kg)   LMP 11/26/1990   SpO2 96%   BMI 25.79 kg/m   Wt Readings from Last 3 Encounters:  04/19/19 123 lb 6.4 oz (56 kg)  03/29/19 123 lb 11.2 oz (56.1 kg)  03/01/19 124 lb 9.6 oz (56.5 kg)   Loma Sousa, RN BSN

## 2019-04-19 NOTE — Patient Instructions (Signed)
Coronavirus (COVID-19) Are you at risk?  Are you at risk for the Coronavirus (COVID-19)?  To be considered HIGH RISK for Coronavirus (COVID-19), you have to meet the following criteria:  . Traveled to [place], [place], [place], [place] or [place]; or in the [place] to [place], [place], [place], or [place]; and have fever, cough, and shortness of breath within the last 2 weeks of travel OR . Been in close contact with a person diagnosed with COVID-19 within the last 2 weeks and have fever, cough, and shortness of breath . IF YOU DO NOT MEET THESE CRITERIA, YOU ARE CONSIDERED LOW RISK FOR COVID-19.  What to do if you are HIGH RISK for COVID-19?  . If you are having a medical emergency, call 911. . Seek medical care right away. Before you go to a doctor's office, urgent care or emergency department, call ahead and tell them about your recent travel, contact with someone diagnosed with COVID-19, and your symptoms. You should receive instructions from your physician's office regarding next steps of care.  . When you arrive at healthcare provider, tell the healthcare staff immediately you have returned from visiting [place], [place], [place], [place] or [place]; or traveled in the [place] to [place], [place], [place], or [place]; in the last two weeks or you have been in close contact with a person diagnosed with COVID-19 in the last 2 weeks.   . Tell the health care staff about your symptoms: fever, cough and shortness of breath. . After you have been seen by a medical provider, you will be either: o Tested for (COVID-19) and discharged home on quarantine except to seek medical care if symptoms worsen, and asked to  - Stay home and avoid contact with others until you get your results (4-5 days)  - Avoid travel on public transportation if possible (such as bus, train, or airplane) or o Sent to the Emergency Department by EMS for evaluation, COVID-19 testing, and possible  admission depending on your condition and test results.  What to do if you are LOW RISK for COVID-19?  Reduce your risk of any infection by using the same precautions used for avoiding the common cold or flu:  . Wash your hands often with soap and warm water for at least 20 seconds.  If soap and water are not readily available, use an alcohol-based hand sanitizer with at least 60% alcohol.  . If coughing or sneezing, cover your mouth and nose by coughing or sneezing into the elbow areas of your shirt or coat, into a tissue or into your sleeve (not your hands). . Avoid shaking hands with others and consider head nods or verbal greetings only. . Avoid touching your eyes, nose, or mouth with unwashed hands.  . Avoid close contact with people who are sick. . Avoid places or events with large numbers of people in one location, like concerts or sporting events. . Carefully consider travel plans you have or are making. . If you are planning any travel outside or inside the [place], visit the CDC's Travelers' Health webpage for the latest health notices. . If you have some symptoms but not all symptoms, continue to monitor at home and seek medical attention if your symptoms worsen. . If you are having a medical emergency, call 911.   ADDITIONAL HEALTHCARE OPTIONS FOR PATIENTS  [facility] / e-Visit: [email or web address]         [facility]: [phone]  [facility]: [phone]                   [facility]: [phone]   

## 2019-04-26 ENCOUNTER — Other Ambulatory Visit: Payer: Self-pay

## 2019-04-26 ENCOUNTER — Inpatient Hospital Stay: Payer: Medicare Other

## 2019-04-26 ENCOUNTER — Other Ambulatory Visit: Payer: Self-pay | Admitting: Hematology and Oncology

## 2019-04-26 ENCOUNTER — Encounter: Payer: Self-pay | Admitting: Hematology and Oncology

## 2019-04-26 ENCOUNTER — Inpatient Hospital Stay: Payer: Medicare Other | Attending: Hematology and Oncology | Admitting: Hematology and Oncology

## 2019-04-26 VITALS — BP 136/68 | HR 65 | Temp 97.8°F | Resp 18 | Ht <= 58 in | Wt 124.0 lb

## 2019-04-26 DIAGNOSIS — E139 Other specified diabetes mellitus without complications: Secondary | ICD-10-CM | POA: Diagnosis not present

## 2019-04-26 DIAGNOSIS — Z833 Family history of diabetes mellitus: Secondary | ICD-10-CM | POA: Diagnosis not present

## 2019-04-26 DIAGNOSIS — Z880 Allergy status to penicillin: Secondary | ICD-10-CM | POA: Insufficient documentation

## 2019-04-26 DIAGNOSIS — R110 Nausea: Secondary | ICD-10-CM | POA: Insufficient documentation

## 2019-04-26 DIAGNOSIS — Z8042 Family history of malignant neoplasm of prostate: Secondary | ICD-10-CM | POA: Insufficient documentation

## 2019-04-26 DIAGNOSIS — Z8719 Personal history of other diseases of the digestive system: Secondary | ICD-10-CM | POA: Insufficient documentation

## 2019-04-26 DIAGNOSIS — C259 Malignant neoplasm of pancreas, unspecified: Secondary | ICD-10-CM | POA: Diagnosis not present

## 2019-04-26 DIAGNOSIS — G4733 Obstructive sleep apnea (adult) (pediatric): Secondary | ICD-10-CM | POA: Insufficient documentation

## 2019-04-26 DIAGNOSIS — Z823 Family history of stroke: Secondary | ICD-10-CM | POA: Insufficient documentation

## 2019-04-26 DIAGNOSIS — K589 Irritable bowel syndrome without diarrhea: Secondary | ICD-10-CM | POA: Diagnosis not present

## 2019-04-26 DIAGNOSIS — Z808 Family history of malignant neoplasm of other organs or systems: Secondary | ICD-10-CM | POA: Diagnosis not present

## 2019-04-26 DIAGNOSIS — I2510 Atherosclerotic heart disease of native coronary artery without angina pectoris: Secondary | ICD-10-CM | POA: Insufficient documentation

## 2019-04-26 DIAGNOSIS — Z79899 Other long term (current) drug therapy: Secondary | ICD-10-CM | POA: Diagnosis not present

## 2019-04-26 DIAGNOSIS — R109 Unspecified abdominal pain: Secondary | ICD-10-CM | POA: Diagnosis not present

## 2019-04-26 DIAGNOSIS — Z881 Allergy status to other antibiotic agents status: Secondary | ICD-10-CM | POA: Insufficient documentation

## 2019-04-26 DIAGNOSIS — Z8249 Family history of ischemic heart disease and other diseases of the circulatory system: Secondary | ICD-10-CM | POA: Insufficient documentation

## 2019-04-26 DIAGNOSIS — G893 Neoplasm related pain (acute) (chronic): Secondary | ICD-10-CM | POA: Insufficient documentation

## 2019-04-26 DIAGNOSIS — C250 Malignant neoplasm of head of pancreas: Secondary | ICD-10-CM | POA: Insufficient documentation

## 2019-04-26 DIAGNOSIS — I10 Essential (primary) hypertension: Secondary | ICD-10-CM | POA: Insufficient documentation

## 2019-04-26 DIAGNOSIS — D649 Anemia, unspecified: Secondary | ICD-10-CM | POA: Diagnosis not present

## 2019-04-26 DIAGNOSIS — Z885 Allergy status to narcotic agent status: Secondary | ICD-10-CM | POA: Diagnosis not present

## 2019-04-26 DIAGNOSIS — K219 Gastro-esophageal reflux disease without esophagitis: Secondary | ICD-10-CM | POA: Insufficient documentation

## 2019-04-26 DIAGNOSIS — Z803 Family history of malignant neoplasm of breast: Secondary | ICD-10-CM | POA: Insufficient documentation

## 2019-04-26 DIAGNOSIS — Z888 Allergy status to other drugs, medicaments and biological substances status: Secondary | ICD-10-CM | POA: Diagnosis not present

## 2019-04-26 DIAGNOSIS — Z853 Personal history of malignant neoplasm of breast: Secondary | ICD-10-CM | POA: Insufficient documentation

## 2019-04-26 LAB — CBC WITH DIFFERENTIAL (CANCER CENTER ONLY)
Abs Immature Granulocytes: 0.01 10*3/uL (ref 0.00–0.07)
Basophils Absolute: 0.1 10*3/uL (ref 0.0–0.1)
Basophils Relative: 1 %
Eosinophils Absolute: 0.2 10*3/uL (ref 0.0–0.5)
Eosinophils Relative: 5 %
HCT: 35.1 % — ABNORMAL LOW (ref 36.0–46.0)
Hemoglobin: 11.6 g/dL — ABNORMAL LOW (ref 12.0–15.0)
Immature Granulocytes: 0 %
Lymphocytes Relative: 42 %
Lymphs Abs: 2.3 10*3/uL (ref 0.7–4.0)
MCH: 29.7 pg (ref 26.0–34.0)
MCHC: 33 g/dL (ref 30.0–36.0)
MCV: 89.8 fL (ref 80.0–100.0)
Monocytes Absolute: 0.6 10*3/uL (ref 0.1–1.0)
Monocytes Relative: 11 %
Neutro Abs: 2.2 10*3/uL (ref 1.7–7.7)
Neutrophils Relative %: 41 %
Platelet Count: 227 10*3/uL (ref 150–400)
RBC: 3.91 MIL/uL (ref 3.87–5.11)
RDW: 15.4 % (ref 11.5–15.5)
WBC Count: 5.4 10*3/uL (ref 4.0–10.5)
nRBC: 0 % (ref 0.0–0.2)

## 2019-04-26 LAB — CMP (CANCER CENTER ONLY)
ALT: 28 U/L (ref 0–44)
AST: 20 U/L (ref 15–41)
Albumin: 3.9 g/dL (ref 3.5–5.0)
Alkaline Phosphatase: 108 U/L (ref 38–126)
Anion gap: 6 (ref 5–15)
BUN: 8 mg/dL (ref 8–23)
CO2: 30 mmol/L (ref 22–32)
Calcium: 8.9 mg/dL (ref 8.9–10.3)
Chloride: 99 mmol/L (ref 98–111)
Creatinine: 1 mg/dL (ref 0.44–1.00)
GFR, Est AFR Am: >60 mL/min (ref >60)
GFR, Estimated: 53 mL/min — ABNORMAL LOW (ref >60)
Glucose, Bld: 136 mg/dL — ABNORMAL HIGH (ref 70–99)
Potassium: 4 mmol/L (ref 3.5–5.1)
Sodium: 135 mmol/L (ref 135–145)
Total Bilirubin: 0.8 mg/dL (ref 0.3–1.2)
Total Protein: 7 g/dL (ref 6.5–8.1)

## 2019-04-26 NOTE — Progress Notes (Signed)
[place] [phone]   [phone]  PROGRESS NOTE  Patient Care Team: Lucille Passy, MD as PCP - General (Family Medicine) Josue Hector, MD as PCP - Cardiology (Cardiology) Neldon Mc, MD as Surgeon (General Surgery) Gery Pray, MD (Radiation Oncology) Magrinat, Virgie Dad, MD (Hematology and Oncology) Cameron Sprang, MD as Consulting Physician (Neurology) Placke, Dawn, RN (Inactive) as Registered Nurse Placke, Dawn, RN (Inactive) as Oncology Nurse Navigator  Hematological/Oncological History  # Pancreatic Adenocarcinoma Stage IA (T1N0M0) 1) 11/26/2018: patient had visit with PCP for pruritis, found to have elevated LFTs 2) 11/27/2018: patient presented to the ED. Underwent CT scan which revealed 1.9 x 1.6 x 1.6 cm cm mass in the head of the pancreas with biliary duct dilation 3) 11/28/2018: ERCP performed to place stent for biliary obstruction  4) 12/10/2018: EUS w/ FNA reveals adenocarcinoma of the pancreas 5) 12/18/2018: establish care with Dr. Lorenso Courier  6) 12/23/2018: surgical visit with Dr. Barry Dienes 7) 12/30/2018: radiation oncology visit with Dr. Sondra Come. Agreeable to SBRT to pancreatic mass.  8) 01/2019: Radiation delayed due to infection with COVID-19.  9) 03/08/2019-03/18/2019: Palliative radiation therapy to pancreatic mass  #Breast Cancer T2 N0, stage IIA invasive ductal carcinoma, grade 3,  ER+/PR+/HER-2 not amplified 1) December 2012:  leftlumpectomy and sentinel lymph node biopsy  2) March 2013: completed radiation therapy, started on letrozole 3) Feb 2018: completed letrozole therapy   HISTORY OF PRESENTING ILLNESS:  [name] 84 y.o. female with medical history significant for CAD s/p CABG in 2011, OSA on CPAP, Breast Cancer, and GERD who presents for a follow up for adenocarcinoma of the pancreas. She was last seen in our clinic on 03/29/2019.   In the interim [name] has been well.  She notes that she has had  some "weird feelings" in her stomach that feel like her stomach is rolling, she is having mild abdominal pain that has been well treated with Tylenol therapy.  She notes that her energy level has been so-so and that she often requires long naps after doing physical activity such as vacuuming the house.  She reports that she does have nausea, but that she chews ginger in order to try to relieve this.  She also notes that she has had good appetite and her weight has been stable.  She denies having any itching or jaundice of the skin.  She does have some occasional gas but has not been having any diarrhea or loose stools.A full 10 point ROS was otherwise negative.    MEDICAL HISTORY:  Past Medical History:  Diagnosis Date  . Asthma   . Atrophic vaginitis   . Breast cancer, Left 12/20/2010   NO BLOOD PRESSURE CHECKS OR STICKS IN LEFT ARM  . CAD (coronary artery disease)    a. 01/19/2010 s/p CABG x 3, lima->lad, vg->diag, vg->om1;  b. 06/2011 :Lexi MV: EF 84%, No ischemia. c. 01/06/14 s/p negative nuclear stress test with EF >70%  . Cervical arthritis   . Colonic polyp 04-27-2009   tubular adenoma  . Diverticulosis of colon (without mention of hemorrhage)   . Fibromyalgia   . Gallstones   . GERD (gastroesophageal reflux disease)   . Glaucoma   . Headache(784.0)    a. frequently assocaited with high BPs  . Hiatal hernia   . Hypercholesterolemia   . Irritable bowel syndrome   . Labile hypertension   . Lymphedema    left arm  . Pancreatic cancer ([place])   .  Pinched vertebral nerve   . Secondary diabetes ([place]) 12/03/2018    SURGICAL HISTORY: Past Surgical History:  Procedure Laterality Date  . [place] STUDY N/A 01/08/2016   Procedure: [place] STUDY;  Surgeon: Ladene Artist, MD;  Location: [facility] ENDOSCOPY;  Service: Endoscopy;  Laterality: N/A;  . BILIARY BRUSHING  11/28/2018   Procedure: BILIARY BRUSHING;  Surgeon: Gatha Mayer, MD;  Location: [facility] ENDOSCOPY;  Service: Endoscopy;;  .  BILIARY DILATION  01/06/2019   Procedure: BILIARY DILATION;  Surgeon: Irving Copas., MD;  Location: [facility] ENDOSCOPY;  Service: Endoscopy;;  . BILIARY STENT PLACEMENT  11/28/2018   Procedure: BILIARY STENT PLACEMENT;  Surgeon: Gatha Mayer, MD;  Location: [facility] ENDOSCOPY;  Service: Endoscopy;;  . BILIARY STENT PLACEMENT N/A 01/06/2019   Procedure: BILIARY STENT PLACEMENT;  Surgeon: Irving Copas., MD;  Location: [facility] ENDOSCOPY;  Service: Endoscopy;  Laterality: N/A;  . BREAST LUMPECTOMY Left 02/06/11  . CATARACT EXTRACTION, BILATERAL     bilateral caaract removal,  . COLONOSCOPY    . CORONARY ANGIOPLASTY WITH STENT PLACEMENT     Stent 2007  . CORONARY ARTERY BYPASS GRAFT    . ENDOSCOPIC RETROGRADE CHOLANGIOPANCREATOGRAPHY (ERCP) WITH PROPOFOL N/A 01/06/2019   Procedure: ENDOSCOPIC RETROGRADE CHOLANGIOPANCREATOGRAPHY (ERCP) WITH PROPOFOL;  Surgeon: Rush Landmark Telford Nab., MD;  Location: [facility] ENDOSCOPY;  Service: Endoscopy;  Laterality: N/A;  [id number]  . ERCP N/A 11/28/2018   Procedure: ENDOSCOPIC RETROGRADE CHOLANGIOPANCREATOGRAPHY (ERCP);  Surgeon: Gatha Mayer, MD;  Location: [facility] END[facility]COPY;  Service: Endoscopy;  Laterality: N/A;  . ESOPHAGEAL MANOMETRY N/A 01/08/2016   Procedure: ESOPHAGEAL MANOMETRY (EM);  Surgeon: Ladene Artist, MD;  Location: [facility] ENDOSCOPY;  Service: Endoscopy;  Laterality: N/A;  . ESOPHAGOGASTRODUODENOSCOPY    . ESOPHAGOGASTRODUODENOSCOPY N/A 01/06/2019   Procedure: ESOPHAGOGASTRODUODENOSCOPY (EGD);  Surgeon: Rush Landmark Telford Nab., MD;  Location: [facility] ENDOSCOPY;  Service: Endoscopy;  Laterality: N/A;  . ESOPHAGOGASTRODUODENOSCOPY (EGD) WITH PROPOFOL N/A 12/10/2018   Procedure: ESOPHAGOGASTRODUODENOSCOPY (EGD) WITH PROPOFOL;  Surgeon: [name], MD;  Location: [facility] ENDOSCOPY;  Service: Endoscopy;  Laterality: N/A;  . EUS N/A 12/10/2018   Procedure: UPPER ENDOSCOPIC ULTRASOUND (EUS) RADIAL;  Surgeon: [name], MD;  Location: [facility] ENDOSCOPY;  Service:  Endoscopy;  Laterality: N/A;  . EUS N/A 01/06/2019   Procedure: FULL UPPER ENDOSCOPIC ULTRASOUND (EUS) RADIAL;  Surgeon: Rush Landmark Telford Nab., MD;  Location: [facility] ENDOSCOPY;  Service: Endoscopy;  Laterality: N/A;  . FIDUCIAL MARKER PLACEMENT  01/06/2019   Procedure: FIDUCIAL MARKER PLACEMENT;  Surgeon: Rush Landmark Telford Nab., MD;  Location: [facility] ENDOSCOPY;  Service: Endoscopy;;  . FINE NEEDLE ASPIRATION N/A 12/10/2018   Procedure: FINE NEEDLE ASPIRATION (FNA) LINEAR;  Surgeon: [name], MD;  Location: [facility] ENDOSCOPY;  Service: Endoscopy;  Laterality: N/A;  . SPHINCTEROTOMY  01/06/2019   Procedure: SPHINCTEROTOMY;  Surgeon: Rush Landmark Telford Nab., MD;  Location: [facility] ENDOSCOPY;  Service: Endoscopy;;  . STENT REMOVAL  01/06/2019   Procedure: STENT REMOVAL;  Surgeon: Irving Copas., MD;  Location: [facility] ENDOSCOPY;  Service: Endoscopy;;    SOCIAL HISTORY: Social History   Socioeconomic History  . Marital status: Married    Spouse name: Gildardo Griffes. Setterlund  . Number of children: 2  . Years of education: 46  . Highest education level: Not on file  Occupational History  . Occupation: retired    Fish farm manager: [employer]  Tobacco Use  . Smoking status: Never Smoker  . Smokeless tobacco: Never Used  Substance and Sexual Activity  . Alcohol  use: No    Alcohol/week: 0.0 standard drinks  . Drug use: No  . Sexual activity: Not Currently    Partners: Male    Birth control/protection: Post-menopausal  Other Topics Concern  . Not on file  Social History Narrative   Patient lives at home with her husband Marcello Moores). 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    Caffeine- sometimes- One cup of coffee.   Right handed.   Two daughters   Four granddaughters.   No EtOH, Tobacco, drugs   Social Determinants of Health   Financial Resource Strain:   . Difficulty of Paying Living Expenses: Not on file  Food Insecurity:   . Worried About  Charity fundraiser in the Last Year: Not on file  . Ran Out of Food in the Last Year: Not on file  Transportation Needs:   . Lack of Transportation (Medical): Not on file  . Lack of Transportation (Non-Medical): Not on file  Physical Activity:   . Days of Exercise per Week: Not on file  . Minutes of Exercise per Session: Not on file  Stress:   . Feeling of Stress : Not on file  Social Connections:   . Frequency of Communication with Friends and Family: Not on file  . Frequency of Social Gatherings with Friends and Family: Not on file  . Attends Religious Services: Not on file  . Active Member of Clubs or Organizations: Not on file  . Attends Archivist Meetings: Not on file  . Marital Status: Not on file  Intimate Partner Violence:   . Fear of Current or Ex-Partner: Not on file  . Emotionally Abused: Not on file  . Physically Abused: Not on file  . Sexually Abused: Not on file    FAMILY HISTORY: Family History  Problem Relation Age of Onset  . Stroke Mother   . Stroke Father   . Prostate cancer Father   . Breast cancer Sister   . Diabetes Sister   . Cancer Brother        gland cancer  . Heart disease Brother   . Colon cancer Neg Hx   . Stomach cancer Neg Hx   . Pancreatic cancer Neg Hx   . Kidney disease Neg Hx   . Liver disease Neg Hx     ALLERGIES:  is allergic to levaquin [levofloxacin in d5w]; choline fenofibrate; hctz [hydrochlorothiazide]; simvastatin; adhesive [tape]; bentyl [dicyclomine hcl]; ceclor [cefaclor]; clarithromycin; codeine; doxycycline; hydrocodone; lisinopril; penicillins; and tobramycin-dexamethasone.  MEDICATIONS:  Current Outpatient Medications  Medication Sig Dispense Refill  . acetaminophen (TYLENOL) 650 MG CR tablet Take 650 mg by mouth every 8 (eight) hours as needed for pain.    [name] albuterol (VENTOLIN HFA) 108 (90 Base) MCG/ACT inhaler Inhale 2 puffs into the lungs every 6 (six) hours as needed for wheezing or shortness of breath.     [name] amLODipine (NORVASC) 5 MG tablet Take 1 tablet (5 mg total) by mouth daily. 90 tablet 3  . aspirin EC 81 MG tablet Take 81 mg by mouth daily.    . blood glucose meter kit and supplies KIT Dispense based on patient and insurance preference.Check fasting blood sugars daily (FOR ICD-9 250.00, 250.01). 1 each 0  . CALCIUM PO Take 1 tablet by mouth daily with breakfast.     . diphenhydrAMINE (BENADRYL) 50 MG tablet Take 0.5 tablets (25 mg total) by mouth 2 (two) times daily. (Patient not  taking: Reported on 03/29/2019) 30 tablet 0  . EPINEPHrine 0.3 mg/0.3 mL IJ SOAJ injection Inject 0.3 mg into the muscle as needed (as directed- FOR A SEVERE REACTION).     [name] fexofenadine (ALLEGRA) 180 MG tablet Take 180 mg by mouth every other day.     . fluticasone (FLONASE) 50 MCG/ACT nasal spray Place 1-2 sprays into both nostrils daily as needed for allergies or rhinitis.    . Glucosamine-Chondroit-Vit C-Mn (GLUCOSAMINE CHONDR 1500 COMPLX PO) Take 1 tablet by mouth 2 (two) times daily.     . hydrALAZINE (APRESOLINE) 50 MG tablet Take 0.5 tablets (25 mg total) by mouth 3 (three) times daily. (Patient taking differently: Take 25 mg by mouth daily. ) 135 tablet 1  . latanoprost (XALATAN) 0.005 % ophthalmic solution Place 1 drop into both eyes at bedtime.     [name] losartan (COZAAR) 100 MG tablet TAKE 1/2 TABLET BY MOUTH THREE TIMES A DAY (Patient taking differently: Take 50 mg by mouth daily. ) 135 tablet 0  . metoprolol tartrate (LOPRESSOR) 50 MG tablet Take 1 tablet (50 mg total) by mouth 3 (three) times daily. 270 tablet 3  . nitroGLYCERIN (NITROSTAT) 0.4 MG SL tablet Place 1 tablet (0.4 mg total) under the tongue every 5 (five) minutes as needed for chest pain. 25 tablet 2  . pantoprazole (PROTONIX) 40 MG tablet Take 1 tablet (40 mg total) by mouth 2 (two) times daily. Take 30-60 minutes before breakfast 60 tablet 11  . potassium chloride SA (KLOR-CON) 20 MEQ tablet Take 1 tablet (20 mEq total) by mouth daily.  (Patient not taking: Reported on 03/29/2019) 14 tablet 0  . Psyllium (METAMUCIL FIBER PO) Take 1 Scoop by mouth daily. Mix one rounded teaspoonful into 8 oz water and drink once a day    . rosuvastatin (CRESTOR) 5 MG tablet TAKE 1 TABLET BY MOUTH 3 TIMES A WEEK. (TAKE ON MONDAY, WEDNESDAY, AND FRIDAY) (Patient taking differently: Take 5 mg by mouth every Monday, Wednesday, and Friday. ) 30 tablet 5  . sodium chloride (OCEAN) 0.65 % SOLN nasal spray Place 1 spray into both nostrils daily as needed for congestion.     . sucralfate (CARAFATE) 1 g tablet TAKE 1 TABLET BY MOUTH BY MOUTH TWICE DAILY(ONE BEFORE DINNER) 60 tablet 1  . traMADol (ULTRAM) 50 MG tablet Take 1/2 to 1 tablet by mouth three times daily as needed (Patient taking differently: Take 25-80 mg by mouth every 6 (six) hours as needed for moderate pain. ) 30 tablet 1  . vitamin B-12 (CYANOCOBALAMIN) 1000 MCG tablet Take 1,000 mcg by mouth daily.      [name] VITAMIN D PO Take 200 mcg by mouth daily with breakfast. Vitamin A  300 mcg Calcium  40 mcg     No current facility-administered medications for this visit.    REVIEW OF SYSTEMS:   Constitutional: ( - ) fevers, ( - )  chills , ( - ) night sweats Eyes: ( - ) blurriness of vision, ( - ) double vision, ( - ) watery eyes Ears, nose, mouth, throat, and face: ( - ) mucositis, ( - ) sore throat Respiratory: ( - ) cough, ( - ) dyspnea, ( - ) wheezes Cardiovascular: ( - ) palpitation, ( - ) chest discomfort, ( - ) lower extremity swelling Gastrointestinal:  ( + ) nausea, ( - ) heartburn, ( - ) change in bowel habits Skin: ( - ) abnormal skin rashes Lymphatics: ( - ) new lymphadenopathy, ( - )  easy bruising Neurological: ( - ) numbness, ( - ) tingling, ( - ) new weaknesses Behavioral/Psych: ( - ) mood change, ( - ) new changes  All other systems were reviewed with the patient and are negative.  PHYSICAL EXAMINATION: ECOG PERFORMANCE STATUS: 2 - Symptomatic, <50% confined to bed  Vitals:    04/26/19 1458  BP: 136/68  Pulse: 65  Resp: 18  Temp: 97.8 F (36.6 C)  SpO2: 96%   Filed Weights   04/26/19 1458  Weight: 124 lb (56.2 kg)    GENERAL: pleasant, well appearing elderly Caucasian female in NAD  SKIN: skin color, texture, turgor are normal, no rashes or significant lesions EYES: conjunctiva are pink and non-injected. No jaundice evident in the sclera.  LUNGS: clear to auscultation and percussion with normal breathing effort HEART: regular rate & rhythm and no murmurs and no lower extremity edema ABDOMEN: soft and non-distended.  Musculoskeletal: no cyanosis of digits and no clubbing  PSYCH: alert & oriented x 3, fluent speech NEURO: no focal motor/sensory deficits  LABORATORY DATA:  I have reviewed the data as listed CMP Latest Ref Rng & Units 04/26/2019 03/29/2019 03/01/2019  Glucose 70 - 99 mg/dL 136(H) 152(H) 131(H)  BUN 8 - 23 mg/dL '8 8 15  '$ Creatinine 0.44 - 1.00 mg/dL 1.00 0.93 0.55  Sodium 135 - 145 mmol/L 135 137 135  Potassium 3.5 - 5.1 mmol/L 4.0 4.3 4.8  Chloride 98 - 111 mmol/L 99 100 95(L)  CO2 22 - 32 mmol/L '30 30 30  '$ Calcium 8.9 - 10.3 mg/dL 8.9 9.1 9.2  Total Protein 6.5 - 8.1 g/dL 7.0 7.2 7.6  Total Bilirubin 0.3 - 1.2 mg/dL 0.8 0.8 1.4(H)  Alkaline Phos 38 - 126 U/L 108 93 94  AST 15 - 41 U/L '20 19 29  '$ ALT 0 - 44 U/L 28 25 38   CBC Latest Ref Rng & Units 04/26/2019 03/29/2019 03/01/2019  WBC 4.0 - 10.5 K/uL 5.4 5.6 9.3  Hemoglobin 12.0 - 15.0 g/dL 11.6(L) 12.3 12.2  Hematocrit 36.0 - 46.0 % 35.1(L) 38.1 37.5  Platelets 150 - 400 K/uL 227 202 239    RADIOGRAPHIC STUDIES: No new interval imaging since last visit. Next CT scan due 05/06/2019  ASSESSMENT & PLAN EMALENE WELTE 84 y.o. female with medical history significant for CAD s/p CABG in 2011, OSA on CPAP, Breast Cancer, and GERD who presents for a follow up for adenocarcinoma of the pancreas.  After review the labs and discussion with [name] her situation appears stable.  Her  bilirubin and LFTs are within normal limits.  Her CBC shows a mild anemia, but is otherwise normal.  Symptomatically she is continuing to have some pain but this is well controlled with as needed Tylenol 500 mg.  At this time there is no need for a change in her current plan.   At this time we will continue with symptom management and close monitoring of her labs today give her a better idea of life expectancy/progression of disease. We will see her back in 6 weeks time for continued monitoring (6 weeks per her request).   #Recently Diagnosed Adenocarcinoma of the Pancreas, Stage IA (T1N0M0) --Completed palliatve radiation therapy on 03/18/2019  --after reviewing patients functional status and ECOG 2 I think the patient is possible a candidate for chemotherapy (likely only monotherapy gemcitabine (J Clin Oncol. 1997 Jun; 15(6):2403-13.)). This could be considered after radiatiation, though I do not favor that approach. The patient and her daughter  are OK with continued comfort based care.  --for now continued supportive care and symptom management.  --CT Scan of the abdomen due 05/06/2019 --RTC in 6 weeks to assess symptoms.   #Breast Cancer T2 N0, stage IIA invasive ductal carcinoma, grade 3,  ER+/PR+/HER-2 not amplified --completed 84 year old letrozole therapy in Feb 2018 --previously followed by Dr. Gunnar Bulla Magrinat [facility] --in remission   #Cancer Related Pain --reports stable pain today, still across the top of the abdomen. --now taking '500mg'$  tylenol PRN --LFTs have normalized, OK to take tylenol at above dosage --will continue to monitor for abdominal pain  #Symptom Management --notes light colored stools that float, but no diarrhea. Possible initial signs of pancreatic insufficiency. Continue to monitor and consider pancreatic enzymes vs low fat diet if progressive. --no itching or jaundice today. Continue to monitor. If worsening, could be sign of stent obstruction.  --mild nausea, at  patient's baseline. Treated with ginger. No diarrhea  #Goals Of Care --patient notes she wants to maintain a high quality of life and does not think that surgery/chemotherapy would allow that. I agree with her assessment --referred to [facility] for introductions, assistance with symptoms management, and preliminary hospice discussions.   All questions were answered. The patient knows to call the clinic with any problems, questions or concerns.  A total of more than 20 minutes were spent face-to-face with the patient during this encounter and over half of that time was spent on counseling and coordination of care as outlined above.   Ledell Peoples, MD Department of Hematology/Oncology [place] at [facility] Phone: [phone] Pager: [phone] Email: [email or web address]   04/26/2019 4:17 PM   Literature Support:  Nyra Capes, Switchenko Hewitt Shorts RJ, [place], [place] T, et al. Outcomes for patients with locally advanced pancreatic adenocarcinoma treated with stereotactic body radiation therapy versus conventionally fractionated radiation. Cancer. 2017;123(18):3486-93.  --Among 8450 patients, 7819 (92.5%) were treated with CFRT, and 631 (7.5%) underwent SBRT. Receipt of SBRT was associated with superior OS in the multivariate analysis (hazard ratio, 0.84; 95% confidence interval, 0.75?0.93; P?<?.001). With propensity score matching, 988 patients in all were matched, with 494 patients in each cohort. Within the propensity?matched cohorts, the median OS (13.9 vs 11.6 months) and the 2?year OS rate (21.7% vs 16.5%) were significantly higher with SBRT versus CFRT (P?=?.0014).

## 2019-04-27 ENCOUNTER — Telehealth: Payer: Self-pay | Admitting: Hematology and Oncology

## 2019-04-27 NOTE — Telephone Encounter (Signed)
Scheduled per 3/1 los. Called and spoke with patient. Confirmed appt  

## 2019-04-29 ENCOUNTER — Other Ambulatory Visit: Payer: Self-pay

## 2019-04-29 ENCOUNTER — Telehealth: Payer: Self-pay | Admitting: General Practice

## 2019-04-29 MED ORDER — LOSARTAN POTASSIUM 100 MG PO TABS: ORAL_TABLET | ORAL | 0 refills | Status: DC

## 2019-04-29 NOTE — Telephone Encounter (Signed)
Rx refilled.

## 2019-04-29 NOTE — Telephone Encounter (Signed)
Patient is calling and is requesting a refill for Losartan sent to [facility] on [address]. Pt has scheduled a TOC appointment scheduled with Dr. Loletha Grayer on 3/18/201. CB [phone]

## 2019-04-29 NOTE — Telephone Encounter (Signed)
Last OV 12/16/18 Last fill 08/06/18  #135/0 Next OV 05/13/19

## 2019-05-04 NOTE — Progress Notes (Signed)
Patient ID: [name], female   DOB: [birth year], 84 y.o.   MRN: [id number]     84 y.o. with HTN, HLD, Fibromyalgia, IBS and GERD. History of CAD and CABG 2011. 12/2013 nonischemic myovue and normal EF by echo. Intolerant to statins  W/u for jaundice and pancreatic mass Had ERCP with CBD stent d/c on 12/10/18  She had COVID infection December and radiation Rx posponed but eventually done 1/11-1/21/21 Her husband also had COVID and was hospitalized for 12 days  She has lost about 10 lbs. Jaundice and pruritis are better Agree with her and Dr Marlowe Aschoff assessment that Whipple should not be performed at her age   ROS: Denies fever, malais, weight loss, blurry vision, decreased visual acuity, cough, sputum, SOB, hemoptysis, pleuritic pain, palpitaitons, heartburn, abdominal pain, melena, lower extremity edema, claudication, or rash.  All other systems reviewed and negative  General: Vitals:   05/18/19 1359  BP: (!) 118/50  Pulse: 61  SpO2: 94%    Affect appropriate Obese white female  HEENT: normal Neck supple with no adenopathy JVP normal no bruits no thyromegaly Lungs clear with no wheezing and good diaphragmatic motion Heart:  S1/S2 no murmur, no rub, gallop or click PMI normal Abdomen: benighn, BS positve, no tenderness, no AAA no bruit.  No HSM or HJR Distal pulses intact with no bruits No edema Neuro non-focal Skin warm and dry No muscular weakness    Current Outpatient Medications  Medication Sig Dispense Refill  . acetaminophen (TYLENOL) 650 MG CR tablet Take 650 mg by mouth every 8 (eight) hours as needed for pain.    [name] albuterol (VENTOLIN HFA) 108 (90 Base) MCG/ACT inhaler Inhale 2 puffs into the lungs every 6 (six) hours as needed for wheezing or shortness of breath.    [name] amLODipine (NORVASC) 5 MG tablet Take 1 tablet (5 mg total) by mouth daily. 90 tablet 3  . aspirin EC 81 MG tablet Take 81 mg by mouth daily.    [name] CALCIUM PO Take 1 tablet by mouth daily with  breakfast.     . diphenhydrAMINE (BENADRYL) 50 MG tablet Take 0.5 tablets (25 mg total) by mouth 2 (two) times daily. 30 tablet 0  . EPINEPHrine 0.3 mg/0.3 mL IJ SOAJ injection Inject 0.3 mg into the muscle as needed (as directed- FOR A SEVERE REACTION).     [name] fexofenadine (ALLEGRA) 180 MG tablet Take 180 mg by mouth every other day.     . fluticasone (FLONASE) 50 MCG/ACT nasal spray Place 1-2 sprays into both nostrils daily as needed for allergies or rhinitis.    . Glucosamine-Chondroit-Vit C-Mn (GLUCOSAMINE CHONDR 1500 COMPLX PO) Take 1 tablet by mouth 2 (two) times daily.     . hydrALAZINE (APRESOLINE) 50 MG tablet Take 0.5 tablets (25 mg total) by mouth 3 (three) times daily. (Patient taking differently: Take 25 mg by mouth daily. ) 135 tablet 1  . latanoprost (XALATAN) 0.005 % ophthalmic solution Place 1 drop into both eyes at bedtime.     [name] losartan (COZAAR) 100 MG tablet TAKE 1/2 TABLET BY MOUTH THREE TIMES A DAY (Patient taking differently: 50 mg. TAKE 1/2 TABLET BY MOUTH QD) 90 tablet 0  . metoprolol tartrate (LOPRESSOR) 50 MG tablet Take 1 tablet (50 mg total) by mouth 3 (three) times daily. 270 tablet 3  . nitroGLYCERIN (NITROSTAT) 0.4 MG SL tablet Place 1 tablet (0.4 mg total) under the tongue every 5 (five) minutes as needed for chest pain. 25 tablet 2  .  pantoprazole (PROTONIX) 40 MG tablet Take 1 tablet (40 mg total) by mouth 2 (two) times daily. Take 30-60 minutes before breakfast 60 tablet 11  . Psyllium (METAMUCIL FIBER PO) Take 1 Scoop by mouth daily. Mix one rounded teaspoonful into 8 oz water and drink once a day    . rosuvastatin (CRESTOR) 5 MG tablet TAKE 1 TABLET BY MOUTH 3 TIMES A WEEK. (TAKE ON MONDAY, WEDNESDAY, AND FRIDAY) 36 tablet 3  . sodium chloride (OCEAN) 0.65 % SOLN nasal spray Place 1 spray into both nostrils daily as needed for congestion.     . sucralfate (CARAFATE) 1 g tablet TAKE 1 TABLET BY MOUTH BY MOUTH TWICE DAILY(ONE BEFORE DINNER) 60 tablet 1  . traMADol  (ULTRAM) 50 MG tablet Take 1/2 to 1 tablet by mouth three times daily as needed (Patient taking differently: Take 25-80 mg by mouth every 6 (six) hours as needed for moderate pain. ) 30 tablet 1  . vitamin B-12 (CYANOCOBALAMIN) 1000 MCG tablet Take 1,000 mcg by mouth daily.      [name] VITAMIN D PO Take 200 mcg by mouth daily with breakfast. Vitamin A  300 mcg Calcium  40 mcg     No current facility-administered medications for this visit.    Allergies  Levaquin [levofloxacin in d5w], Choline fenofibrate, Hctz [hydrochlorothiazide], Simvastatin, Adhesive [tape], Bentyl [dicyclomine hcl], Ceclor [cefaclor], Clarithromycin, Codeine, Doxycycline, Hydrocodone, Lisinopril, Penicillins, and Tobramycin-dexamethasone  Electrocardiogram:   11/21/17 SR rate 57 nonspecific ST changes 12/17/18 SR arte 67 normal   Assessment and Plan  CAD/CABG: 2011  non ishcemic myovue 2015  She will call if has to take nitro continue ASA/Beta blockerSome esophageal dysmotility causes dysphagia and ? Chest pain that confuses picture  HTN:  Improved on amlodipine   Breast Cancer   F/U Magrinat No BP's on left arm  mamogram last year ok  Chol:  Not at goal intolerant to statins given age would not suggest PSK-9 or Nexlitol   Pancreatic Cancer:  F/'u oncology and surgery Has had ERCP and stent Finished palliative radiation Rx 03/18/19 F/U CT scheduled this month   F/U  6 months   Jenkins Rouge

## 2019-05-05 ENCOUNTER — Other Ambulatory Visit: Payer: Self-pay

## 2019-05-05 ENCOUNTER — Other Ambulatory Visit: Payer: Medicare Other | Admitting: *Deleted

## 2019-05-05 DIAGNOSIS — Z515 Encounter for palliative care: Secondary | ICD-10-CM

## 2019-05-06 ENCOUNTER — Ambulatory Visit (HOSPITAL_COMMUNITY)
Admission: RE | Admit: 2019-05-06 | Discharge: 2019-05-06 | Disposition: A | Discharge status: Home / Self Care | Payer: Medicare Other | Source: Ambulatory Visit | Attending: Radiation Oncology | Admitting: Radiation Oncology

## 2019-05-06 ENCOUNTER — Other Ambulatory Visit: Payer: Self-pay

## 2019-05-06 DIAGNOSIS — C250 Malignant neoplasm of head of pancreas: Secondary | ICD-10-CM

## 2019-05-06 MED ORDER — SODIUM CHLORIDE (PF) 0.9 % IJ SOLN
INTRAMUSCULAR | Status: AC
  Filled 2019-05-06: qty 50

## 2019-05-06 MED ORDER — IOHEXOL 300 MG/ML  SOLN
100.0000 mL | Freq: Once | INTRAMUSCULAR | Status: AC | PRN
  Administered 2019-05-06: 100 mL via INTRAVENOUS

## 2019-05-10 ENCOUNTER — Other Ambulatory Visit: Payer: Self-pay | Admitting: Gastroenterology

## 2019-05-10 NOTE — Progress Notes (Signed)
COMMUNITY PALLIATIVE CARE RN NOTE  PATIENT NAME: [name] DOB: 1935/06/22 MRN: [id number]  PRIMARY CARE PROVIDER: Lucille Passy, MD  RESPONSIBLE PARTY:  Acct ID - Guarantor Home Phone Work Phone Relationship Acct Type  [id number] [name]  Self P/F     [address], [place], [place] [address]   Due to the COVID-19 crisis, this virtual check-in visit was done via telephone from my office and it was initiated and consent by this patient and or family.  PLAN OF CARE and INTERVENTION:  1. ADVANCE CARE PLANNING/GOALS OF CARE: Goal is for patient to remain at home with her husband.  2. PATIENT/CAREGIVER EDUCATION: Symptom management 3. DISEASE STATUS: Virtual check-in visit completed via telephone. Patient does experience some occasional mild lower abdominal "sensations" in her lower right and left abdomen. They are tolerable and she does not feel it requires pain medication. She does have Tylenol on hand if needed. Her breathing is good. She had a doctor's appointment last week with her Oncologist, and there were no issues. She remains independent with bathing, dressing, toileting and feeding. She is able to prepare some meals and perform household chores. She is ambulatory without the use of assistive devices. She takes walks around her home to maintain her strength. Her appetite is good. No reports of nausea/vomiting. She does have to watch herself when eating food high in fat content, as this may cause some mild nausea. No dysphagia. Her blood sugars are controlled well. Last CBG 136. She goes tomorrow for a follow-up CT scan s/p radiation to pancreatic mass. Will continue to monitor.   HISTORY OF PRESENT ILLNESS:  This is a 84 yo female with a diagnosis of Pancreatic cancer Stage 1A, who resides at home with her husband. She has a very supportive family. Palliative care continues to follow patient and visits monthly and PRN.  CODE STATUS: Full code ADVANCED DIRECTIVES:  N MOST FORM: no PPS: 50%   (Duration of visit and documentation 45 minutes)   Daryl Eastern, RN BSN

## 2019-05-13 ENCOUNTER — Encounter: Payer: Self-pay | Admitting: Radiation Oncology

## 2019-05-13 ENCOUNTER — Other Ambulatory Visit: Payer: Self-pay

## 2019-05-13 ENCOUNTER — Ambulatory Visit
Admission: RE | Admit: 2019-05-13 | Discharge: 2019-05-13 | Disposition: A | Discharge status: Home / Self Care | Payer: Medicare Other | Source: Ambulatory Visit | Attending: Radiation Oncology | Admitting: Radiation Oncology

## 2019-05-13 ENCOUNTER — Ambulatory Visit: Payer: Medicare Other | Admitting: Family Medicine

## 2019-05-13 ENCOUNTER — Ambulatory Visit (INDEPENDENT_AMBULATORY_CARE_PROVIDER_SITE_OTHER): Payer: Medicare Other | Admitting: Family Medicine

## 2019-05-13 ENCOUNTER — Encounter: Payer: Self-pay | Admitting: Family Medicine

## 2019-05-13 VITALS — BP 120/64 | HR 61 | Temp 98.1°F | Ht <= 58 in | Wt 124.0 lb

## 2019-05-13 VITALS — BP 147/59 | Temp 97.8°F | Resp 20 | Wt 123.4 lb

## 2019-05-13 DIAGNOSIS — K5730 Diverticulosis of large intestine without perforation or abscess without bleeding: Secondary | ICD-10-CM | POA: Diagnosis not present

## 2019-05-13 DIAGNOSIS — C250 Malignant neoplasm of head of pancreas: Secondary | ICD-10-CM

## 2019-05-13 DIAGNOSIS — G8929 Other chronic pain: Secondary | ICD-10-CM

## 2019-05-13 DIAGNOSIS — E782 Mixed hyperlipidemia: Secondary | ICD-10-CM

## 2019-05-13 DIAGNOSIS — I700 Atherosclerosis of aorta: Secondary | ICD-10-CM | POA: Diagnosis not present

## 2019-05-13 DIAGNOSIS — Z923 Personal history of irradiation: Secondary | ICD-10-CM | POA: Insufficient documentation

## 2019-05-13 DIAGNOSIS — E139 Other specified diabetes mellitus without complications: Secondary | ICD-10-CM

## 2019-05-13 DIAGNOSIS — M4316 Spondylolisthesis, lumbar region: Secondary | ICD-10-CM | POA: Insufficient documentation

## 2019-05-13 DIAGNOSIS — M545 Low back pain, unspecified: Secondary | ICD-10-CM

## 2019-05-13 DIAGNOSIS — R110 Nausea: Secondary | ICD-10-CM | POA: Diagnosis not present

## 2019-05-13 DIAGNOSIS — Z8616 Personal history of COVID-19: Secondary | ICD-10-CM | POA: Insufficient documentation

## 2019-05-13 DIAGNOSIS — Z79899 Other long term (current) drug therapy: Secondary | ICD-10-CM | POA: Diagnosis not present

## 2019-05-13 DIAGNOSIS — M47816 Spondylosis without myelopathy or radiculopathy, lumbar region: Secondary | ICD-10-CM | POA: Insufficient documentation

## 2019-05-13 DIAGNOSIS — Z7982 Long term (current) use of aspirin: Secondary | ICD-10-CM | POA: Insufficient documentation

## 2019-05-13 MED ORDER — ROSUVASTATIN CALCIUM 5 MG PO TABS: ORAL_TABLET | ORAL | 3 refills | Status: DC

## 2019-05-13 MED FILL — ROSUVASTATIN CALCIUM 5 MG T: 5 | 90 days supply | Qty: 36 | Fill #0

## 2019-05-13 NOTE — Patient Instructions (Signed)
Coronavirus (COVID-19) Are you at risk?  Are you at risk for the Coronavirus (COVID-19)?  To be considered HIGH RISK for Coronavirus (COVID-19), you have to meet the following criteria:  . Traveled to [place], [place], [place], [place] or [place]; or in the [place] to [place], [place], [place], or [place]; and have fever, cough, and shortness of breath within the last 2 weeks of travel OR . Been in close contact with a person diagnosed with COVID-19 within the last 2 weeks and have fever, cough, and shortness of breath . IF YOU DO NOT MEET THESE CRITERIA, YOU ARE CONSIDERED LOW RISK FOR COVID-19.  What to do if you are HIGH RISK for COVID-19?  . If you are having a medical emergency, call 911. . Seek medical care right away. Before you go to a doctor's office, urgent care or emergency department, call ahead and tell them about your recent travel, contact with someone diagnosed with COVID-19, and your symptoms. You should receive instructions from your physician's office regarding next steps of care.  . When you arrive at healthcare provider, tell the healthcare staff immediately you have returned from visiting [place], [place], [place], [place] or [place]; or traveled in the [place] to [place], [place], [place], or [place]; in the last two weeks or you have been in close contact with a person diagnosed with COVID-19 in the last 2 weeks.   . Tell the health care staff about your symptoms: fever, cough and shortness of breath. . After you have been seen by a medical provider, you will be either: o Tested for (COVID-19) and discharged home on quarantine except to seek medical care if symptoms worsen, and asked to  - Stay home and avoid contact with others until you get your results (4-5 days)  - Avoid travel on public transportation if possible (such as bus, train, or airplane) or o Sent to the Emergency Department by EMS for evaluation, COVID-19 testing, and possible  admission depending on your condition and test results.  What to do if you are LOW RISK for COVID-19?  Reduce your risk of any infection by using the same precautions used for avoiding the common cold or flu:  . Wash your hands often with soap and warm water for at least 20 seconds.  If soap and water are not readily available, use an alcohol-based hand sanitizer with at least 60% alcohol.  . If coughing or sneezing, cover your mouth and nose by coughing or sneezing into the elbow areas of your shirt or coat, into a tissue or into your sleeve (not your hands). . Avoid shaking hands with others and consider head nods or verbal greetings only. . Avoid touching your eyes, nose, or mouth with unwashed hands.  . Avoid close contact with people who are sick. . Avoid places or events with large numbers of people in one location, like concerts or sporting events. . Carefully consider travel plans you have or are making. . If you are planning any travel outside or inside the [place], visit the CDC's Travelers' Health webpage for the latest health notices. . If you have some symptoms but not all symptoms, continue to monitor at home and seek medical attention if your symptoms worsen. . If you are having a medical emergency, call 911.   ADDITIONAL HEALTHCARE OPTIONS FOR PATIENTS  [facility] / e-Visit: [email or web address]         [facility]: [phone]  [facility]: [phone]                   [facility]: [phone]   

## 2019-05-13 NOTE — Progress Notes (Signed)
Ms. Clouthier presents today for CT scan results. Pt reports mid-line abdominal pain, intermittent. Pt denies N/V. Pt with occasional nausea that is relieved by chewing ginger. Pt denies diarrhea.  BP (!) 147/59 (BP Location: Right Arm, Patient Position: Sitting, Cuff Size: Normal)   Temp 97.8 F (36.6 C)   Resp 20   Wt 123 lb 6.4 oz (56 kg)   LMP 11/26/1990   SpO2 97%   BMI 25.79 kg/m   Wt Readings from Last 3 Encounters:  05/13/19 123 lb 6.4 oz (56 kg)  05/13/19 124 lb (56.2 kg)  04/26/19 124 lb (56.2 kg)   Loma Sousa, RN BSN

## 2019-05-13 NOTE — Progress Notes (Signed)
Radiation Oncology         [phone]) [phone] ________________________________  Name: [name] MRN: [id number]  Date: 05/13/2019  DOB: November 29, 1935  Follow-Up Visit Note  CC: Lucille Passy, MD  Stark Klein, MD    ICD-10-CM   1. Adenocarcinoma of head of pancreas ([name])  C25.0     Diagnosis: Adenocarcinoma of the pancreatic head,uT2N1  Interval Since Last Radiation: 2 months   Radiation Treatment Dates: 03/08/2019 through 03/18/2019 Site Technique Total Dose (Gy) Dose per Fx (Gy) Completed Fx Beam Energies  Pancreas: Pancreas IMRT 33/33 6.6 5/5 6XFFF    Narrative:  The patient returns today for routine follow-up. She last saw Dr. Lorenso Courier for follow up on 04/26/2019.  Since her last visit, she underwent restaging CT abdomen/pelvis on 05/06/2019 showing: mild apparent increase in size of pancreatic head neoplasm; no compelling findings of metastatic disease.  On review of systems, the patient reports occasional discomfort in the upper abdomen nodule consistent.  She also has occasional pain in the mid back region along the left paraspinal area.  Her discomfort is controlled with Tylenol.  She occasionally will get nausea control by chewing ginger  ALLERGIES:  is allergic to levaquin [levofloxacin in d5w]; choline fenofibrate; hctz [hydrochlorothiazide]; simvastatin; adhesive [tape]; bentyl [dicyclomine hcl]; ceclor [cefaclor]; clarithromycin; codeine; doxycycline; hydrocodone; lisinopril; penicillins; and tobramycin-dexamethasone.  Meds: Current Outpatient Medications  Medication Sig Dispense Refill  . acetaminophen (TYLENOL) 650 MG CR tablet Take 650 mg by mouth every 8 (eight) hours as needed for pain.    [name] albuterol (VENTOLIN HFA) 108 (90 Base) MCG/ACT inhaler Inhale 2 puffs into the lungs every 6 (six) hours as needed for wheezing or shortness of breath.    [name] amLODipine (NORVASC) 5 MG tablet Take 1 tablet (5 mg total) by mouth daily. 90 tablet 3  . aspirin EC 81 MG tablet Take  81 mg by mouth daily.    . blood glucose meter kit and supplies KIT Dispense based on patient and insurance preference.Check fasting blood sugars daily (FOR ICD-9 250.00, 250.01). 1 each 0  . CALCIUM PO Take 1 tablet by mouth daily with breakfast.     . diphenhydrAMINE (BENADRYL) 50 MG tablet Take 0.5 tablets (25 mg total) by mouth 2 (two) times daily. 30 tablet 0  . EPINEPHrine 0.3 mg/0.3 mL IJ SOAJ injection Inject 0.3 mg into the muscle as needed (as directed- FOR A SEVERE REACTION).     [name] fexofenadine (ALLEGRA) 180 MG tablet Take 180 mg by mouth every other day.     . fluticasone (FLONASE) 50 MCG/ACT nasal spray Place 1-2 sprays into both nostrils daily as needed for allergies or rhinitis.    . Glucosamine-Chondroit-Vit C-Mn (GLUCOSAMINE CHONDR 1500 COMPLX PO) Take 1 tablet by mouth 2 (two) times daily.     . hydrALAZINE (APRESOLINE) 50 MG tablet Take 0.5 tablets (25 mg total) by mouth 3 (three) times daily. (Patient taking differently: Take 25 mg by mouth daily. ) 135 tablet 1  . latanoprost (XALATAN) 0.005 % ophthalmic solution Place 1 drop into both eyes at bedtime.     [name] losartan (COZAAR) 100 MG tablet TAKE 1/2 TABLET BY MOUTH THREE TIMES A DAY (Patient taking differently: 50 mg. TAKE 1/2 TABLET BY MOUTH QD) 90 tablet 0  . metoprolol tartrate (LOPRESSOR) 50 MG tablet Take 1 tablet (50 mg total) by mouth 3 (three) times daily. 270 tablet 3  . nitroGLYCERIN (NITROSTAT) 0.4 MG SL tablet Place 1 tablet (0.4 mg total) under the  tongue every 5 (five) minutes as needed for chest pain. 25 tablet 2  . pantoprazole (PROTONIX) 40 MG tablet Take 1 tablet (40 mg total) by mouth 2 (two) times daily. Take 30-60 minutes before breakfast 60 tablet 11  . Psyllium (METAMUCIL FIBER PO) Take 1 Scoop by mouth daily. Mix one rounded teaspoonful into 8 oz water and drink once a day    . rosuvastatin (CRESTOR) 5 MG tablet TAKE 1 TABLET BY MOUTH 3 TIMES A WEEK. (TAKE ON MONDAY, WEDNESDAY, AND FRIDAY) 36 tablet 3  .  sodium chloride (OCEAN) 0.65 % SOLN nasal spray Place 1 spray into both nostrils daily as needed for congestion.     . sucralfate (CARAFATE) 1 g tablet TAKE 1 TABLET BY MOUTH BY MOUTH TWICE DAILY(ONE BEFORE DINNER) 60 tablet 1  . traMADol (ULTRAM) 50 MG tablet Take 1/2 to 1 tablet by mouth three times daily as needed (Patient taking differently: Take 25-80 mg by mouth every 6 (six) hours as needed for moderate pain. ) 30 tablet 1  . vitamin B-12 (CYANOCOBALAMIN) 1000 MCG tablet Take 1,000 mcg by mouth daily.      [name] VITAMIN D PO Take 200 mcg by mouth daily with breakfast. Vitamin A  300 mcg Calcium  40 mcg     No current facility-administered medications for this encounter.    Physical Findings: The patient is in no acute distress. Patient is alert and oriented.  weight is 123 lb 6.4 oz (56 kg). Her temperature is 97.8 F (36.6 C). Her blood pressure is 147/59 (abnormal). Her respiration is 20 and oxygen saturation is 97%. .   Lungs are clear to auscultation bilaterally. Heart has regular rate and rhythm. No palpable cervical, supraclavicular, or axillary adenopathy. Abdomen soft, non-tender, normal bowel sounds.  No palpable epigastric mass.   Lab Findings: Lab Results  Component Value Date   WBC 5.4 04/26/2019   HGB 11.6 (L) 04/26/2019   HCT 35.1 (L) 04/26/2019   MCV 89.8 04/26/2019   PLT 227 04/26/2019    Radiographic Findings: CT Abdomen Pelvis W Contrast  Result Date: 05/06/2019 CLINICAL DATA:  Stage I A pancreatic adenocarcinoma in the head of the pancreas, status post palliative SBRT in mid January 2021. History of breast cancer, prior radiation therapy and prior letrozole therapy, finished February 2018. EXAM: CT ABDOMEN AND PELVIS WITH CONTRAST TECHNIQUE: Multidetector CT imaging of the abdomen and pelvis was performed using the standard protocol following bolus administration of intravenous contrast. CONTRAST:  165m OMNIPAQUE IOHEXOL 300 MG/ML  SOLN COMPARISON:  11/27/2018  FINDINGS: Lower chest: Linear subsegmental atelectasis or scarring in the posterior basal segments of both lower lobes. Small calcified granuloma in the left lower lobe on image 18/2, benign. Descending thoracic aortic atherosclerotic calcification. Hepatobiliary: 3.4 by 1.6 cm wedge-shaped focus of peripheral arterial phase enhancement in the right hepatic lobe on image 21/3, not readily apparent on the portal venous phase images, much of this is likely from transient hepatic attenuation difference ("THAD"), although an underlying lesion as a cause for the THAD is not well seen separately. There is some additional scattered arterial phase heterogeneity in the liver which is not reflected on the portal venous phase images, and which may primarily be due to the early contrast phase rather than underlying lesions. Pneumobilia is present with a expandable stent extending from the common hepatic duct into the duodenum, traversing the pancreatic head mass. This has relieved the prior biliary dilatation. Borderline gallbladder wall thickening. Pancreas: Hypoenhancing tissue in the  pancreatic head surrounds the biliary stent and measures roughly 2.3 by 2.3 cm on image 45/8, mildly increased from my measurements of 1.8 by 1.9 cm on 11/27/2018. Hypoenhancing tumor abuts a approximately 90 degree arc of the right-side of the superior mesenteric vein without discrete invasion of the SMV. No current tumor along the celiac trunk or SMA. Conventional hepatic artery branching. There continues to be prominent dilatation of dorsal pancreatic duct in the pancreatic body and tail due to the obstructing tumor. Three fiducial clips are present along the pancreatic tumor. There is ill definition of fat planes adjacent to the pancreatic head tumor which could be due to inflammation or tumor infiltration. Spleen: 2 small hypodense splenic lesions on image 42/9 are unchanged and technically too small to characterize although statistically  likely to be benign. Adrenals/Urinary Tract: Unremarkable Stomach/Bowel: The pancreatic mass is in close proximity to the descending duodenum, with poor definition of fat planes separating the structures. Mild stranding around the transverse duodenum for example on image 57/8, possibly related to local inflammation from radiation therapy. On image 57/9, we demonstrate a small duodenal diverticulum of the transverse duodenum. Although this is in the vicinity of the inflammatory stranding, this diverticulum was present on 11/27/2018 and hence I do not suspect this a being a ulcer or micro perforation. Sigmoid colon diverticulosis is present. Vascular/Lymphatic: Aortoiliac atherosclerotic vascular disease. Patent portal vein, splenic vein, and SMV. Reproductive: Unremarkable Other: Small amount of free pelvic fluid in the cul-de-sac. Musculoskeletal: Degenerative arthropathy of both hips. 7 mm degenerative anterolisthesis at L4-5. Lumbar spondylosis and degenerative disc disease. Bilateral foraminal impingement at L5-S1. Central narrowing of the thecal sac at L4-5. IMPRESSION: 1. Mild apparent increase in size of the pancreatic head neoplasm, currently about 2.3 by 2.3 cm. Some of this hypodensity might be attributable to surrounding inflammatory findings related to SBRT, and there is some indistinctness of surrounding fat planes. Also there is a new expandable stent extending through the tumor, which might spuriously increased measured tumor dimension. Overall, however, I favor mild progression in tumor size. 2. No compelling findings of metastatic disease. There is a focus of apparent transient hepatic attenuation difference in the right hepatic lobe but without visible lesion on the portal venous phase images, and this may well simply be incidental. 3. Two small stable hypodense splenic lesions are likely benign but technically too small to characterize. 4. There is some stranding around the transverse duodenum,  probably related to SBRT. Mild local enteritis not excluded. 5. Other imaging findings of potential clinical significance: Aortic Atherosclerosis (ICD10-I70.0). Sigmoid colon diverticulosis. Small amount of free pelvic fluid in the cul-de-sac, cause uncertain. Lumbar spondylosis and degenerative disc disease causing impingement at L4-5 at L5-S1. Electronically Signed   By: Van Clines M.D.   On: 05/06/2019 15:30    Impression: Adenocarcinoma of the pancreatic head,uT2N1, s/p SBRT  The patient is recovering from the effects of radiation.  Scanned questions local progression but this is difficult to interpret.  Her images are compared to her last CT scan 5 months ago.  Treatments were delayed secondary to her developing COVID-19 infection.  She also had a stent placed in this area making imaging measurements difficult.  Overall I feel this is a reasonable result since there is no evidence of metastatic disease in the lymph nodes or liver.  Clinically she seems to be doing well at this time.  She understands that her disease will eventually progress but she is pleased with how she feels at this  time.  Plan: Follow-up with radiation oncology in three months.  Patient will meet with medical oncology later this month.  ____________________________________   Blair Promise, PhD, MD  This document serves as a record of services personally performed by Gery Pray, MD. It was created on his behalf by Wilburn Mylar, a trained medical scribe. The creation of this record is based on the scribe's personal observations and the provider's statements to them. This document has been checked and approved by the attending provider.

## 2019-05-13 NOTE — Progress Notes (Signed)
Virtual Visit via Telephone Note  I connected with [name] on 05/13/19 at 11:00 AM EDT by telephone and verified that I am speaking with the correct person using two identifiers.   I discussed the limitations, risks, security and privacy concerns of performing an evaluation and management service by telephone and the availability of in person appointments. I also discussed with the patient that there may be a patient responsible charge related to this service. The patient expressed understanding and agreed to proceed.  Location patient: home Location provider: home office Participants present for the call: patient, provider Patient did not have a visit in the prior 7 days to address this/these issue(s).  Chief Complaint  Patient presents with  . Establish Care    Medication refill, Crestor.  Pt c/o lower back pain, lt side.    History of Present Illness: [name] is a 84 y.o. female who is former patient of Dr. Deborra Medina. She is married, lives with her husband who is 38yo. She has 2 daughters - [name] and [name] - and 4 grand daughters. She needs a refill of her crestor '5mg'$  which she takes M,W,F.   She was diagnosed with pancreatic adenocarcinoma in 11/2018. She is currently undergoing radiation. She has appt today to go over recent CT scan. DM secondary to pancreatic adenocarcinoma.  Appetite is ok, not great as per daughter. Weight has lost 10lbs since 11/2018.   Last labs with PCP in 11/2018. Pt had covid vaccine series.  Chronic LBP, h/o lumbar DDD but pt also states she gets muscle spasms in low back. Sees chiropractor, does heat, stretching, takes tylenol.   Cardio - Dr. Johnsie Cancel GI - Dr. Fuller Plan Oncology - Dr. Lorenso Courier Neuro - Dr. Delice Lesch Sleep - Dr. Rexene Alberts with [facility]  Past Medical History:  Diagnosis Date  . Asthma   . Atrophic vaginitis   . Breast cancer, Left 12/20/2010   NO BLOOD PRESSURE CHECKS OR STICKS IN LEFT ARM  . CAD (coronary artery  disease)    a. 01/19/2010 s/p CABG x 3, [name]->lad, vg->diag, vg->om1;  b. 06/2011 :Lexi MV: EF 84%, No ischemia. c. 01/06/14 s/p negative nuclear stress test with EF >70%  . Cervical arthritis   . Colonic polyp 04-27-2009   tubular adenoma  . Diverticulosis of colon (without mention of hemorrhage)   . Fibromyalgia   . Gallstones   . GERD (gastroesophageal reflux disease)   . Glaucoma   . Headache(784.0)    a. frequently assocaited with high BPs  . Hiatal hernia   . Hypercholesterolemia   . Irritable bowel syndrome   . Labile hypertension   . Lymphedema    left arm  . Pancreatic cancer ([place])   . Pinched vertebral nerve   . Secondary diabetes ([place]) 12/03/2018    Past Surgical History:  Procedure Laterality Date  . [place] STUDY N/A 01/08/2016   Procedure: [place] STUDY;  Surgeon: Ladene Artist, MD;  Location: [facility] ENDOSCOPY;  Service: Endoscopy;  Laterality: N/A;  . BILIARY BRUSHING  11/28/2018   Procedure: BILIARY BRUSHING;  Surgeon: Gatha Mayer, MD;  Location: [facility] ENDOSCOPY;  Service: Endoscopy;;  . BILIARY DILATION  01/06/2019   Procedure: BILIARY DILATION;  Surgeon: Irving Copas., MD;  Location: [facility] ENDOSCOPY;  Service: Endoscopy;;  . BILIARY STENT PLACEMENT  11/28/2018   Procedure: BILIARY STENT PLACEMENT;  Surgeon: Gatha Mayer, MD;  Location: [facility] ENDOSCOPY;  Service: Endoscopy;;  . BILIARY STENT PLACEMENT N/A 01/06/2019  Procedure: BILIARY STENT PLACEMENT;  Surgeon: Rush Landmark [place] Nab., MD;  Location: [facility] ENDOSCOPY;  Service: Endoscopy;  Laterality: N/A;  . BREAST LUMPECTOMY Left 02/06/11  . CATARACT EXTRACTION, BILATERAL     bilateral caaract removal,  . COLONOSCOPY    . CORONARY ANGIOPLASTY WITH STENT PLACEMENT     Stent 2007  . CORONARY ARTERY BYPASS GRAFT    . ENDOSCOPIC RETROGRADE CHOLANGIOPANCREATOGRAPHY (ERCP) WITH PROPOFOL N/A 01/06/2019   Procedure: ENDOSCOPIC RETROGRADE CHOLANGIOPANCREATOGRAPHY (ERCP) WITH PROPOFOL;  Surgeon: Rush Landmark  [place] Nab., MD;  Location: [facility] ENDOSCOPY;  Service: Endoscopy;  Laterality: N/A;  [id number]  . ERCP N/A 11/28/2018   Procedure: ENDOSCOPIC RETROGRADE CHOLANGIOPANCREATOGRAPHY (ERCP);  Surgeon: Gatha Mayer, MD;  Location: [facility] ENDOSCOPY;  Service: Endoscopy;  Laterality: N/A;  . ESOPHAGEAL MANOMETRY N/A 01/08/2016   Procedure: ESOPHAGEAL MANOMETRY (EM);  Surgeon: Ladene Artist, MD;  Location: [facility]OS[facility];  Service: Endoscopy;  Laterality: N/A;  . ESOPHAGOGASTRODUODENOSCOPY    . ESOPHAGOGASTRODUODENOSCOPY N/A 01/06/2019   Procedure: ESOPHAGOGASTRODUODENOSCOPY (EGD);  Surgeon: Rush Landmark [place] Nab., MD;  Location: [facility] ENDOS[facility];  Service: Endoscopy;  Laterality: N/A;  . ESOPHAGOGASTRODUODENOSCOPY (EGD) WITH PROPOFOL N/A 12/10/2018   Procedure: ESOPHAGOGASTRODUODENOSCOPY (EGD) WITH PROPOFOL;  Surgeon: [name], MD;  Location: [facility] ENDOSCOPY;  Service: Endoscopy;  Laterality: N/A;  . EUS N/A 12/10/2018   Procedure: UPPER ENDOSCOPIC ULTRASOUND (EUS) RADIAL;  Surgeon: [name], MD;  Location: [facility] ENDOSCOPY;  Service: Endoscopy;  Laterality: N/A;  . EUS N/A 01/06/2019   Procedure: FULL UPPER ENDOSCOPIC ULTRASOUND (EUS) RADIAL;  Surgeon: Rush Landmark [place] Nab., MD;  Location: [facility] ENDOSCOPY;  Service: Endoscopy;  Laterality: N/A;  . FIDUCIAL MARKER PLACEMENT  01/06/2019   Procedure: FIDUCIAL MARKER PLACEMENT;  Surgeon: Rush Landmark [place] Nab., MD;  Location: [facility] ENDOS[facility];  Service: Endoscopy;;  . FINE NEEDLE ASPIRATION N/A 12/10/2018   Procedure: FINE NEEDLE ASPIRATION (FNA) LINEAR;  Surgeon: [name], MD;  Location: [facility] ENDOSCOPY;  Service: Endoscopy;  Laterality: N/A;  . SPHINCTEROTOMY  01/06/2019   Procedure: SPHINCTEROTOMY;  Surgeon: Rush Landmark [place] Nab., MD;  Location: [facility] ENDOS[facility];  Service: Endoscopy;;  . STENT REMOVAL  01/06/2019   Procedure: STENT REMOVAL;  Surgeon: Irving Copas., MD;  Location: [facility] ENDOSCOPY;  Service: Endoscopy;;    Social History   Tobacco  Use  . Smoking status: Never Smoker  . Smokeless tobacco: Never Used  Substance Use Topics  . Alcohol use: No    Alcohol/week: 0.0 standard drinks  . Drug use: No    Family History  Problem Relation Age of Onset  . Stroke Mother   . Stroke Father   . Prostate cancer Father   . Breast cancer Sister   . Diabetes Sister   . Cancer Brother        gland cancer  . Heart disease Brother   . Colon cancer Neg Hx   . Stomach cancer Neg Hx   . Pancreatic cancer Neg Hx   . Kidney disease Neg Hx   . Liver disease Neg Hx     Outpatient Encounter Medications as of 05/13/2019  Medication Sig Note  . acetaminophen (TYLENOL) 650 MG CR tablet Take 650 mg by mouth every 8 (eight) hours as needed for pain.   [name] albuterol (VENTOLIN HFA) 108 (90 Base) MCG/ACT inhaler Inhale 2 puffs into the lungs every 6 (six) hours as needed for wheezing or shortness of breath.   [name] amLODipine (NORVASC) 5 MG tablet Take 1 tablet (5 mg total) by mouth daily.   [name]  aspirin EC 81 MG tablet Take 81 mg by mouth daily.   . blood glucose meter kit and supplies KIT Dispense based on patient and insurance preference.Check fasting blood sugars daily (FOR ICD-9 250.00, 250.01).   [name] diphenhydrAMINE (BENADRYL) 50 MG tablet Take 0.5 tablets (25 mg total) by mouth 2 (two) times daily.   [name] EPINEPHrine 0.3 mg/0.3 mL IJ SOAJ injection Inject 0.3 mg into the muscle as needed (as directed- FOR A SEVERE REACTION).    [name] fexofenadine (ALLEGRA) 180 MG tablet Take 180 mg by mouth every other day.    . fluticasone (FLONASE) 50 MCG/ACT nasal spray Place 1-2 sprays into both nostrils daily as needed for allergies or rhinitis.   . Glucosamine-Chondroit-Vit C-Mn (GLUCOSAMINE CHONDR 1500 COMPLX PO) Take 1 tablet by mouth 2 (two) times daily.    . hydrALAZINE (APRESOLINE) 50 MG tablet Take 0.5 tablets (25 mg total) by mouth 3 (three) times daily. (Patient taking differently: Take 25 mg by mouth daily. )   . latanoprost (XALATAN) 0.005 % ophthalmic  solution Place 1 drop into both eyes at bedtime.    [name] losartan (COZAAR) 100 MG tablet TAKE 1/2 TABLET BY MOUTH THREE TIMES A DAY (Patient taking differently: 50 mg. TAKE 1/2 TABLET BY MOUTH QD)   . metoprolol tartrate (LOPRESSOR) 50 MG tablet Take 1 tablet (50 mg total) by mouth 3 (three) times daily.   . nitroGLYCERIN (NITROSTAT) 0.4 MG SL tablet Place 1 tablet (0.4 mg total) under the tongue every 5 (five) minutes as needed for chest pain.   . pantoprazole (PROTONIX) 40 MG tablet Take 1 tablet (40 mg total) by mouth 2 (two) times daily. Take 30-60 minutes before breakfast   . Psyllium (METAMUCIL FIBER PO) Take 1 Scoop by mouth daily. Mix one rounded teaspoonful into 8 oz water and drink once a day   . rosuvastatin (CRESTOR) 5 MG tablet TAKE 1 TABLET BY MOUTH 3 TIMES A WEEK. (TAKE ON MONDAY, WEDNESDAY, AND FRIDAY) (Patient taking differently: Take 5 mg by mouth every Monday, Wednesday, and Friday. )   . sodium chloride (OCEAN) 0.65 % SOLN nasal spray Place 1 spray into both nostrils daily as needed for congestion.    . sucralfate (CARAFATE) 1 g tablet TAKE 1 TABLET BY MOUTH BY MOUTH TWICE DAILY(ONE BEFORE DINNER)   . traMADol (ULTRAM) 50 MG tablet Take 1/2 to 1 tablet by mouth three times daily as needed (Patient taking differently: Take 25-80 mg by mouth every 6 (six) hours as needed for moderate pain. )   . vitamin B-12 (CYANOCOBALAMIN) 1000 MCG tablet Take 1,000 mcg by mouth daily.     [name] VITAMIN D PO Take 200 mcg by mouth daily with breakfast. Vitamin A  300 mcg Calcium  40 mcg   . CALCIUM PO Take 1 tablet by mouth daily with breakfast.    . [DISCONTINUED] potassium chloride SA (KLOR-CON) 20 MEQ tablet Take 1 tablet (20 mEq total) by mouth daily. (Patient not taking: Reported on 03/29/2019) 01/05/2019: Have not started yet   No facility-administered encounter medications on file as of 05/13/2019.     Allergies  Allergen Reactions  . Levaquin [Levofloxacin In D5w]     Elevated BP  . Choline  Fenofibrate Other (See Comments)     pt states INTOL to Trilipix w/ "thigh burning"  . Hctz [Hydrochlorothiazide]     Causes hyponatremia  . Simvastatin Other (See Comments)     pt states INTOL to STATINS' \\T'$ \ refuses to restart  .  Adhesive [Tape] Rash  . Bentyl [Dicyclomine Hcl] Other (See Comments)    "Made me feel weird and drained me"  . Ceclor [Cefaclor] Rash  . Clarithromycin Rash  . Codeine Nausea Only  . Doxycycline Rash  . Hydrocodone     Nightmare after taking cough syrup w/hydrocodone  . Lisinopril Cough    Developed ACE cough...  . Penicillins Itching, Rash and Other (See Comments)    At injection site Did it involve swelling of the face/tongue/throat, SOB, or low BP? No Did it involve sudden or severe rash/hives, skin peeling, or any reaction on the inside of your mouth or nose? No Did you need to seek medical attention at a hospital or doctor's office? No When did it last happen?"It was a long time ago" If all above answers are "NO", may proceed with cephalosporin use.   . Tobramycin-Dexamethasone Rash      ROS: See pertinent positives and negatives per HPI.   Observations/Objective: Wt Readings from Last 3 Encounters:  05/13/19 124 lb (56.2 kg)  04/26/19 124 lb (56.2 kg)  04/19/19 123 lb 6.4 oz (56 kg)    Patient sounds cheerful and well on the phone. I do not appreciate any SOB. Speech and thought processing are grossly intact. Patient reported vitals:  BP 120/64 (BP Location: Right Arm, Patient Position: Sitting, Cuff Size: Normal)   Pulse 61   Temp 98.1 F (36.7 C) (Oral)   Ht '4\' 10"'$  (1.473 m)   Wt 124 lb (56.2 kg)   LMP 11/26/1990   BMI 25.92 kg/m   Assessment and Plan:  1. Mixed hyperlipidemia Refill: - rosuvastatin (CRESTOR) 5 MG tablet; TAKE 1 TABLET BY MOUTH 3 TIMES A WEEK. (TAKE ON MONDAY, WEDNESDAY, AND FRIDAY)  Dispense: 36 tablet; Refill: 3  2. Secondary diabetes ([place]) - lat A1C in 12/2018 - Hemoglobin A1c; Future  3.  Adenocarcinoma of head of pancreas ([place]) - cont current f/u and treatment regimen with oncology  4. Chronic left-sided low back pain without sciatica - pt and daughter not h/o lumbar DDD - pt feels she also has MSK low back pain. She sees a Restaurant manager, fast food, has had massage, applies ice/heat, takes tylenol PRN. She requests info about a "doctor who specializes in muscles". Discussed possible referral to sports med. Pt will think it over and let me know  I did not refer this patient for an OV in the next 24 hours for this/these issue(s).  I discussed the assessment and treatment plan with the patient. The patient was provided an opportunity to ask questions and all were answered. The patient agreed with the plan and demonstrated an understanding of the instructions.   The patient was advised to call back or seek an in-person evaluation if the symptoms worsen or if the condition fails to improve as anticipated.  I provided 26 minutes of non-face-to-face time during this encounter.   Letta Median, DO

## 2019-05-18 ENCOUNTER — Other Ambulatory Visit: Payer: Self-pay

## 2019-05-18 ENCOUNTER — Ambulatory Visit (INDEPENDENT_AMBULATORY_CARE_PROVIDER_SITE_OTHER): Payer: Medicare Other | Admitting: Cardiovascular Disease

## 2019-05-18 ENCOUNTER — Encounter: Payer: Self-pay | Admitting: Certified Nurse Midwife

## 2019-05-18 ENCOUNTER — Encounter: Payer: Self-pay | Admitting: Cardiovascular Disease

## 2019-05-18 VITALS — BP 118/50 | HR 61 | Ht <= 58 in | Wt 126.0 lb

## 2019-05-18 DIAGNOSIS — Z951 Presence of aortocoronary bypass graft: Secondary | ICD-10-CM | POA: Diagnosis not present

## 2019-05-18 NOTE — Patient Instructions (Signed)

## 2019-06-04 ENCOUNTER — Other Ambulatory Visit: Payer: Self-pay | Admitting: *Deleted

## 2019-06-04 DIAGNOSIS — C259 Malignant neoplasm of pancreas, unspecified: Secondary | ICD-10-CM

## 2019-06-07 ENCOUNTER — Inpatient Hospital Stay: Payer: Medicare Other

## 2019-06-07 ENCOUNTER — Inpatient Hospital Stay: Payer: Medicare Other | Attending: Hematology and Oncology | Admitting: Hematology and Oncology

## 2019-06-07 ENCOUNTER — Encounter: Payer: Self-pay | Admitting: Hematology and Oncology

## 2019-06-07 ENCOUNTER — Other Ambulatory Visit: Payer: Self-pay

## 2019-06-07 VITALS — BP 161/65 | HR 57 | Temp 98.0°F | Resp 17 | Ht <= 58 in | Wt 123.6 lb

## 2019-06-07 DIAGNOSIS — D649 Anemia, unspecified: Secondary | ICD-10-CM | POA: Insufficient documentation

## 2019-06-07 DIAGNOSIS — Z853 Personal history of malignant neoplasm of breast: Secondary | ICD-10-CM | POA: Diagnosis not present

## 2019-06-07 DIAGNOSIS — I2510 Atherosclerotic heart disease of native coronary artery without angina pectoris: Secondary | ICD-10-CM | POA: Diagnosis not present

## 2019-06-07 DIAGNOSIS — Z888 Allergy status to other drugs, medicaments and biological substances status: Secondary | ICD-10-CM | POA: Diagnosis not present

## 2019-06-07 DIAGNOSIS — M797 Fibromyalgia: Secondary | ICD-10-CM | POA: Insufficient documentation

## 2019-06-07 DIAGNOSIS — K831 Obstruction of bile duct: Secondary | ICD-10-CM

## 2019-06-07 DIAGNOSIS — Z880 Allergy status to penicillin: Secondary | ICD-10-CM | POA: Insufficient documentation

## 2019-06-07 DIAGNOSIS — K589 Irritable bowel syndrome without diarrhea: Secondary | ICD-10-CM | POA: Diagnosis not present

## 2019-06-07 DIAGNOSIS — I10 Essential (primary) hypertension: Secondary | ICD-10-CM | POA: Insufficient documentation

## 2019-06-07 DIAGNOSIS — C801 Malignant (primary) neoplasm, unspecified: Secondary | ICD-10-CM | POA: Diagnosis not present

## 2019-06-07 DIAGNOSIS — G4733 Obstructive sleep apnea (adult) (pediatric): Secondary | ICD-10-CM | POA: Insufficient documentation

## 2019-06-07 DIAGNOSIS — Z79899 Other long term (current) drug therapy: Secondary | ICD-10-CM | POA: Insufficient documentation

## 2019-06-07 DIAGNOSIS — G893 Neoplasm related pain (acute) (chronic): Secondary | ICD-10-CM | POA: Insufficient documentation

## 2019-06-07 DIAGNOSIS — E785 Hyperlipidemia, unspecified: Secondary | ICD-10-CM | POA: Insufficient documentation

## 2019-06-07 DIAGNOSIS — C250 Malignant neoplasm of head of pancreas: Secondary | ICD-10-CM | POA: Insufficient documentation

## 2019-06-07 DIAGNOSIS — R110 Nausea: Secondary | ICD-10-CM | POA: Diagnosis not present

## 2019-06-07 DIAGNOSIS — Z923 Personal history of irradiation: Secondary | ICD-10-CM | POA: Diagnosis not present

## 2019-06-07 DIAGNOSIS — Z881 Allergy status to other antibiotic agents status: Secondary | ICD-10-CM | POA: Diagnosis not present

## 2019-06-07 DIAGNOSIS — K219 Gastro-esophageal reflux disease without esophagitis: Secondary | ICD-10-CM | POA: Diagnosis not present

## 2019-06-07 DIAGNOSIS — Z885 Allergy status to narcotic agent status: Secondary | ICD-10-CM | POA: Insufficient documentation

## 2019-06-07 DIAGNOSIS — R109 Unspecified abdominal pain: Secondary | ICD-10-CM | POA: Insufficient documentation

## 2019-06-07 DIAGNOSIS — Z8719 Personal history of other diseases of the digestive system: Secondary | ICD-10-CM | POA: Diagnosis not present

## 2019-06-07 DIAGNOSIS — C259 Malignant neoplasm of pancreas, unspecified: Secondary | ICD-10-CM

## 2019-06-07 LAB — CMP (CANCER CENTER ONLY)
ALT: 19 U/L (ref 0–44)
AST: 18 U/L (ref 15–41)
Albumin: 3.7 g/dL (ref 3.5–5.0)
Alkaline Phosphatase: 115 U/L (ref 38–126)
Anion gap: 9 (ref 5–15)
BUN: 9 mg/dL (ref 8–23)
CO2: 29 mmol/L (ref 22–32)
Calcium: 8.9 mg/dL (ref 8.9–10.3)
Chloride: 98 mmol/L (ref 98–111)
Creatinine: 0.78 mg/dL (ref 0.44–1.00)
GFR, Est AFR Am: >60 mL/min (ref >60)
GFR, Estimated: >60 mL/min (ref >60)
Glucose, Bld: 158 mg/dL — ABNORMAL HIGH (ref 70–99)
Potassium: 4.5 mmol/L (ref 3.5–5.1)
Sodium: 136 mmol/L (ref 135–145)
Total Bilirubin: 1 mg/dL (ref 0.3–1.2)
Total Protein: 6.9 g/dL (ref 6.5–8.1)

## 2019-06-07 LAB — CBC WITH DIFFERENTIAL (CANCER CENTER ONLY)
Abs Immature Granulocytes: 0.01 10*3/uL (ref 0.00–0.07)
Basophils Absolute: 0 10*3/uL (ref 0.0–0.1)
Basophils Relative: 1 %
Eosinophils Absolute: 0.2 10*3/uL (ref 0.0–0.5)
Eosinophils Relative: 3 %
HCT: 34.4 % — ABNORMAL LOW (ref 36.0–46.0)
Hemoglobin: 11.2 g/dL — ABNORMAL LOW (ref 12.0–15.0)
Immature Granulocytes: 0 %
Lymphocytes Relative: 32 %
Lymphs Abs: 1.8 10*3/uL (ref 0.7–4.0)
MCH: 30 pg (ref 26.0–34.0)
MCHC: 32.6 g/dL (ref 30.0–36.0)
MCV: 92.2 fL (ref 80.0–100.0)
Monocytes Absolute: 0.6 10*3/uL (ref 0.1–1.0)
Monocytes Relative: 11 %
Neutro Abs: 3.1 10*3/uL (ref 1.7–7.7)
Neutrophils Relative %: 53 %
Platelet Count: 268 10*3/uL (ref 150–400)
RBC: 3.73 MIL/uL — ABNORMAL LOW (ref 3.87–5.11)
RDW: 15 % (ref 11.5–15.5)
WBC Count: 5.8 10*3/uL (ref 4.0–10.5)
nRBC: 0 % (ref 0.0–0.2)

## 2019-06-07 NOTE — Progress Notes (Signed)
[place] [phone]   [phone]  PROGRESS NOTE  Patient Care Team: Ronnald Nian, DO as PCP - General (Family Medicine) Josue Hector, MD as PCP - Cardiology (Cardiology) Neldon Mc, MD as Surgeon (General Surgery) Gery Pray, MD (Radiation Oncology) Magrinat, Virgie Dad, MD (Hematology and Oncology) Cameron Sprang, MD as Consulting Physician (Neurology) Placke, Dawn, RN (Inactive) as Registered Nurse Placke, Dawn, RN (Inactive) as Oncology Nurse Navigator  Hematological/Oncological History  # Pancreatic Adenocarcinoma Stage IA (T1N0M0) 1) 11/26/2018: patient had visit with PCP for pruritis, found to have elevated LFTs 2) 11/27/2018: patient presented to the ED. Underwent CT scan which revealed 1.9 x 1.6 x 1.6 cm cm mass in the head of the pancreas with biliary duct dilation 3) 11/28/2018: ERCP performed to place stent for biliary obstruction  4) 12/10/2018: EUS w/ FNA reveals adenocarcinoma of the pancreas 5) 12/18/2018: establish care with Dr. Lorenso Courier  6) 12/23/2018: surgical visit with Dr. Barry Dienes 7) 12/30/2018: radiation oncology visit with Dr. Sondra Come. Agreeable to SBRT to pancreatic mass.  8) 01/2019: Radiation delayed due to infection with COVID-19.  9) 03/08/2019-03/18/2019: Palliative radiation therapy to pancreatic mass 10) 05/06/2019: CT scan abdomen shows modest increase in tumor size with no clear evidence of metastatic spread.   #Breast Cancer T2 N0, stage IIA invasive ductal carcinoma, grade 3,  ER+/PR+/HER-2 not amplified 1) December 2012:  leftlumpectomy and sentinel lymph node biopsy  2) March 2013: completed radiation therapy, started on letrozole 3) Feb 2018: completed letrozole therapy   HISTORY OF PRESENTING ILLNESS:  [name] 84 y.o. female with medical history significant for CAD s/p CABG in 2011, OSA on CPAP, Breast Cancer, and GERD who presents for a follow up for adenocarcinoma of the pancreas.  She was last seen in our clinic on 04/26/2019. In the interim she had a CT scan performed on 05/06/2019 which revealed a mild increase in the size of the mass to 2.3 x 2.3 cm.   On exam today [name] notes that she feels well overall, but that her main concern is that she has low energy and feels "drained".  She notes that she has been getting a good sleep at night and that her appetite has been good.  She continues to have an occasional bandlike pain across her abdomen which has been responding well to Tylenol 500 mg to 1000 mg.  She notes that she does not have to take this more than once per day.  She also notes that she continues to have some gas and floating stools, but overall has had very stable GI symptoms.  The patient reports that she has not had any issues with jaundice, itching, or worsening nausea/vomiting.  A full 10 point ROS is listed below.   MEDICAL HISTORY:  Past Medical History:  Diagnosis Date  . Asthma   . Atrophic vaginitis   . Breast cancer, Left 12/20/2010   NO BLOOD PRESSURE CHECKS OR STICKS IN LEFT ARM  . CAD (coronary artery disease)    a. 01/19/2010 s/p CABG x 3, lima->lad, vg->diag, vg->om1;  b. 06/2011 :Lexi MV: EF 84%, No ischemia. c. 01/06/14 s/p negative nuclear stress test with EF >70%  . Cervical arthritis   . Colonic polyp 04-27-2009   tubular adenoma  . Diverticulosis of colon (without mention of hemorrhage)   . Fibromyalgia   . Gallstones   . GERD (gastroesophageal reflux disease)   . Glaucoma   . Headache(784.0)    a. frequently  assocaited with high BPs  . Hiatal hernia   . Hypercholesterolemia   . Irritable bowel syndrome   . Labile hypertension   . Lymphedema    left arm  . Pancreatic cancer ([place])   . Pinched vertebral nerve   . Secondary diabetes ([place]) 12/03/2018    SURGICAL HISTORY: Past Surgical History:  Procedure Laterality Date  . [place] STUDY N/A 01/08/2016   Procedure: [place] STUDY;  Surgeon: Ladene Artist, MD;   Location: [facility] ENDOSCOPY;  Service: Endoscopy;  Laterality: N/A;  . BILIARY BRUSHING  11/28/2018   Procedure: BILIARY BRUSHING;  Surgeon: Gatha Mayer, MD;  Location: [facility] ENDOSCOPY;  Service: Endoscopy;;  . BILIARY DILATION  01/06/2019   Procedure: BILIARY DILATION;  Surgeon: Irving Copas., MD;  Location: [facility] ENDOSCOPY;  Service: Endoscopy;;  . BILIARY STENT PLACEMENT  11/28/2018   Procedure: BILIARY STENT PLACEMENT;  Surgeon: Gatha Mayer, MD;  Location: [facility] ENDOSCOPY;  Service: Endoscopy;;  . BILIARY STENT PLACEMENT N/A 01/06/2019   Procedure: BILIARY STENT PLACEMENT;  Surgeon: Irving Copas., MD;  Location: [facility] ENDOSCOPY;  Service: Endoscopy;  Laterality: N/A;  . BREAST LUMPECTOMY Left 02/06/11  . CATARACT EXTRACTION, BILATERAL     bilateral caaract removal,  . COLONOSCOPY    . CORONARY ANGIOPLASTY WITH STENT PLACEMENT     Stent 2007  . CORONARY ARTERY BYPASS GRAFT    . ENDOSCOPIC RETROGRADE CHOLANGIOPANCREATOGRAPHY (ERCP) WITH PROPOFOL N/A 01/06/2019   Procedure: ENDOSCOPIC RETROGRADE CHOLANGIOPANCREATOGRAPHY (ERCP) WITH PROPOFOL;  Surgeon: Rush Landmark Telford Nab., MD;  Location: [facility] ENDOSCOPY;  Service: Endoscopy;  Laterality: N/A;  [id number]  . ERCP N/A 11/28/2018   Procedure: ENDOSCOPIC RETROGRADE CHOLANGIOPANCREATOGRAPHY (ERCP);  Surgeon: Gatha Mayer, MD;  Location: [facility] ENDOSCOPY;  Service: Endoscopy;  Laterality: N/A;  . ESOPHAGEAL MANOMETRY N/A 01/08/2016   Procedure: ESOPHAGEAL MANOMETRY (EM);  Surgeon: Ladene Artist, MD;  Location: [facility];  Service: Endoscopy;  Laterality: N/A;  . ESOPHAGOGASTRODUODENOSCOPY    . ESOPHAGOGASTRODUODENOSCOPY N/A 01/06/2019   Procedure: ESOPHAGOGASTRODUODENOSCOPY (EGD);  Surgeon: Rush Landmark Telford Nab., MD;  Location: [facility] END[facility];  Service: Endoscopy;  Laterality: N/A;  . ESOPHAGOGASTRODUODENOSCOPY (EGD) WITH PROPOFOL N/A 12/10/2018   Procedure: ESOPHAGOGASTRODUODENOSCOPY (EGD) WITH PROPOFOL;  Surgeon: [name], MD;   Location: [facility] ENDOS[facility];  Service: Endoscopy;  Laterality: N/A;  . EUS N/A 12/10/2018   Procedure: UPPER ENDOSCOPIC ULTRASOUND (EUS) RADIAL;  Surgeon: [name], MD;  Location: [facility] END[facility];  Service: Endoscopy;  Laterality: N/A;  . EUS N/A 01/06/2019   Procedure: FULL UPPER ENDOSCOPIC ULTRASOUND (EUS) RADIAL;  Surgeon: Rush Landmark Telford Nab., MD;  Location: [facility] END[facility];  Service: Endoscopy;  Laterality: N/A;  . FIDUCIAL MARKER PLACEMENT  01/06/2019   Procedure: FIDUCIAL MARKER PLACEMENT;  Surgeon: Rush Landmark Telford Nab., MD;  Location: [facility] ENDOSCOPY;  Service: Endoscopy;;  . FINE NEEDLE ASPIRATION N/A 12/10/2018   Procedure: FINE NEEDLE ASPIRATION (FNA) LINEAR;  Surgeon: [name], MD;  Location: [facility] ENDOSCOPY;  Service: Endoscopy;  Laterality: N/A;  . SPHINCTEROTOMY  01/06/2019   Procedure: SPHINCTEROTOMY;  Surgeon: Mansouraty, Telford Nab., MD;  Location: [facility] ENDOSCOPY;  Service: Endoscopy;;  . STENT REMOVAL  01/06/2019   Procedure: STENT REMOVAL;  Surgeon: Irving Copas., MD;  Location: [facility] ENDOSCOPY;  Service: Endoscopy;;    ALLERGIES:  is allergic to levaquin [levofloxacin in d5w]; choline fenofibrate; hctz [hydrochlorothiazide]; simvastatin; adhesive [tape]; bentyl [dicyclomine hcl]; ceclor [cefaclor]; clarithromycin; codeine; doxycycline; hydrocodone; lisinopril; penicillins; and tobramycin-dexamethasone.  MEDICATIONS:  Current Outpatient Medications  Medication Sig  Dispense Refill  . acetaminophen (TYLENOL) 650 MG CR tablet Take 650 mg by mouth every 8 (eight) hours as needed for pain.    [name] albuterol (VENTOLIN HFA) 108 (90 Base) MCG/ACT inhaler Inhale 2 puffs into the lungs every 6 (six) hours as needed for wheezing or shortness of breath.    [name] amLODipine (NORVASC) 5 MG tablet Take 1 tablet (5 mg total) by mouth daily. 90 tablet 3  . aspirin EC 81 MG tablet Take 81 mg by mouth daily.    [name] CALCIUM PO Take 1 tablet by mouth daily with breakfast.     .  diphenhydrAMINE (BENADRYL) 50 MG tablet Take 0.5 tablets (25 mg total) by mouth 2 (two) times daily. 30 tablet 0  . EPINEPHrine 0.3 mg/0.3 mL IJ SOAJ injection Inject 0.3 mg into the muscle as needed (as directed- FOR A SEVERE REACTION).     [name] fexofenadine (ALLEGRA) 180 MG tablet Take 180 mg by mouth every other day.     . fluticasone (FLONASE) 50 MCG/ACT nasal spray Place 1-2 sprays into both nostrils daily as needed for allergies or rhinitis.    . Glucosamine-Chondroit-Vit C-Mn (GLUCOSAMINE CHONDR 1500 COMPLX PO) Take 1 tablet by mouth 2 (two) times daily.     . hydrALAZINE (APRESOLINE) 50 MG tablet Take 0.5 tablets (25 mg total) by mouth 3 (three) times daily. (Patient taking differently: Take 25 mg by mouth daily. ) 135 tablet 1  . latanoprost (XALATAN) 0.005 % ophthalmic solution Place 1 drop into both eyes at bedtime.     [name] losartan (COZAAR) 100 MG tablet TAKE 1/2 TABLET BY MOUTH THREE TIMES A DAY (Patient taking differently: 50 mg. TAKE 1/2 TABLET BY MOUTH QD) 90 tablet 0  . metoprolol tartrate (LOPRESSOR) 50 MG tablet Take 1 tablet (50 mg total) by mouth 3 (three) times daily. 270 tablet 3  . nitroGLYCERIN (NITROSTAT) 0.4 MG SL tablet Place 1 tablet (0.4 mg total) under the tongue every 5 (five) minutes as needed for chest pain. 25 tablet 2  . pantoprazole (PROTONIX) 40 MG tablet Take 1 tablet (40 mg total) by mouth 2 (two) times daily. Take 30-60 minutes before breakfast 60 tablet 11  . Psyllium (METAMUCIL FIBER PO) Take 1 Scoop by mouth daily. Mix one rounded teaspoonful into 8 oz water and drink once a day    . rosuvastatin (CRESTOR) 5 MG tablet TAKE 1 TABLET BY MOUTH 3 TIMES A WEEK. (TAKE ON MONDAY, WEDNESDAY, AND FRIDAY) 36 tablet 3  . sodium chloride (OCEAN) 0.65 % SOLN nasal spray Place 1 spray into both nostrils daily as needed for congestion.     . sucralfate (CARAFATE) 1 g tablet TAKE 1 TABLET BY MOUTH BY MOUTH TWICE DAILY(ONE BEFORE DINNER) 60 tablet 1  . traMADol (ULTRAM) 50 MG  tablet Take 1/2 to 1 tablet by mouth three times daily as needed (Patient taking differently: Take 25-80 mg by mouth every 6 (six) hours as needed for moderate pain. ) 30 tablet 1  . vitamin B-12 (CYANOCOBALAMIN) 1000 MCG tablet Take 1,000 mcg by mouth daily.      [name] VITAMIN D PO Take 200 mcg by mouth daily with breakfast. Vitamin A  300 mcg Calcium  40 mcg     No current facility-administered medications for this visit.    REVIEW OF SYSTEMS:   Constitutional: ( - ) fevers, ( - )  chills , ( - ) night sweats Eyes: ( - ) blurriness of vision, ( - ) double vision, ( - )  watery eyes Ears, nose, mouth, throat, and face: ( - ) mucositis, ( - ) sore throat Respiratory: ( - ) cough, ( - ) dyspnea, ( - ) wheezes Cardiovascular: ( - ) palpitation, ( - ) chest discomfort, ( - ) lower extremity swelling Gastrointestinal:  ( + ) nausea, ( - ) heartburn, ( - ) change in bowel habits Skin: ( - ) abnormal skin rashes Lymphatics: ( - ) new lymphadenopathy, ( - ) easy bruising Neurological: ( - ) numbness, ( - ) tingling, ( - ) new weaknesses Behavioral/Psych: ( - ) mood change, ( - ) new changes  All other systems were reviewed with the patient and are negative.  PHYSICAL EXAMINATION: ECOG PERFORMANCE STATUS: 2 - Symptomatic, <50% confined to bed  Vitals:   06/07/19 1123  BP: (!) 161/65  Pulse: (!) 57  Resp: 17  Temp: 98 F (36.7 C)  SpO2: 98%   Filed Weights   06/07/19 1123  Weight: 123 lb 9.6 oz (56.1 kg)    GENERAL: pleasant, well appearing elderly Caucasian female in NAD  SKIN: skin color, texture, turgor are normal, no rashes or significant lesions EYES: conjunctiva are pink and non-injected. No jaundice evident in the sclera.  LUNGS: clear to auscultation and percussion with normal breathing effort HEART: regular rate & rhythm and no murmurs and no lower extremity edema ABDOMEN: soft and non-distended.  Musculoskeletal: no cyanosis of digits and no clubbing  PSYCH: alert & oriented  x 3, fluent speech NEURO: no focal motor/sensory deficits  LABORATORY DATA:  I have reviewed the data as listed CMP Latest Ref Rng & Units 06/07/2019 04/26/2019 03/29/2019  Glucose 70 - 99 mg/dL 158(H) 136(H) 152(H)  BUN 8 - 23 mg/dL '9 8 8  '$ Creatinine 0.44 - 1.00 mg/dL 0.78 1.00 0.93  Sodium 135 - 145 mmol/L 136 135 137  Potassium 3.5 - 5.1 mmol/L 4.5 4.0 4.3  Chloride 98 - 111 mmol/L 98 99 100  CO2 22 - 32 mmol/L '29 30 30  '$ Calcium 8.9 - 10.3 mg/dL 8.9 8.9 9.1  Total Protein 6.5 - 8.1 g/dL 6.9 7.0 7.2  Total Bilirubin 0.3 - 1.2 mg/dL 1.0 0.8 0.8  Alkaline Phos 38 - 126 U/L 115 108 93  AST 15 - 41 U/L '18 20 19  '$ ALT 0 - 44 U/L '19 28 25   '$ CBC Latest Ref Rng & Units 06/07/2019 04/26/2019 03/29/2019  WBC 4.0 - 10.5 K/uL 5.8 5.4 5.6  Hemoglobin 12.0 - 15.0 g/dL 11.2(L) 11.6(L) 12.3  Hematocrit 36.0 - 46.0 % 34.4(L) 35.1(L) 38.1  Platelets 150 - 400 K/uL 268 227 202    RADIOGRAPHIC STUDIES: No new interval imaging since last visit. Next CT scan due 05/06/2019  ASSESSMENT & PLAN ABIGAEL MOGLE 84 y.o. female with medical history significant for CAD s/p CABG in 2011, OSA on CPAP, Breast Cancer, and GERD who presents for a follow up for localized adenocarcinoma of the pancreas.  After review the labs and discussion with [name] her clinical status appears stable.  Her bilirubin and LFTs are within normal limits.  Her CBC shows a mild anemia which has worsened slightly, but is otherwise normal.  Symptomatically she is continuing to have some bandlike abdominal pain but this is well controlled with as needed Tylenol [phone] mg PRN.  At this time there is no need for a change in her current plan.   At this time we will continue with symptom management and close monitoring of her labs today  give her a better idea of life expectancy/progression of disease. We will see her back in 6 weeks time for continued monitoring (6 weeks per her request).   #Localized Adenocarcinoma of the Pancreas, Stage  IA (T1N0M0) --Completed palliatve radiation therapy on 03/18/2019  --after reviewing patients functional status and ECOG 2 I think the patient is possible a candidate for chemotherapy (likely only monotherapy gemcitabine (J Clin Oncol. 1997 Jun; 15(6):2403-13.)). This could be considered after radiatiation, though I do not favor that approach. The patient and her daughter are OK with continued comfort based care. --last CT scan on 05/06/2019 showed  Mild increase in size of tumor (2.3 x 2.3 cm)  --for now continued supportive care and symptom management.  --RTC in 6 weeks to assess symptoms.   #Breast Cancer T2 N0, stage IIA invasive ductal carcinoma, grade 3,  ER+/PR+/HER-2 not amplified --completed 84 year old letrozole therapy in Feb 2018 --previously followed by Dr. Gunnar Bulla Magrinat at [facility] --in remission   #Cancer Related Pain, stable --reports stable pain today, still across the top of the abdomen. --now taking [phone] mg tylenol PRN --LFTs have normalized, OK to take tylenol at above dosage --will continue to monitor for abdominal pain  #Symptom Management --notes light colored stools that float, but no diarrhea. Possible initial signs of pancreatic insufficiency. Continue to monitor and consider pancreatic enzymes vs low fat diet if progressive. --no itching or jaundice today. Continue to monitor. If worsening, could be sign of stent obstruction.  --mild nausea, at patient's baseline. Treated with ginger. No diarrhea  #Goals Of Care --patient notes she wants to maintain a high quality of life and does not think that surgery/chemotherapy would allow that. I agree with her assessment --referred to [place] care for introductions, assistance with symptoms management, and preliminary hospice discussions.   All questions were answered. The patient knows to call the clinic with any problems, questions or concerns.  A total of more than 30 minutes were spent  face-to-face with the patient during this encounter and over half of that time was spent on counseling and coordination of care as outlined above.   Ledell Peoples, MD Department of Hematology/Oncology [place] at [facility] Phone: [phone] Pager: [phone] Email: [email or web address]   06/07/2019 11:47 AM   Literature Support:  Nyra Capes, Switchenko Hewitt Shorts RJ, Grant, Luther T, et al. Outcomes for patients with locally advanced pancreatic adenocarcinoma treated with stereotactic body radiation therapy versus conventionally fractionated radiation. Cancer. 2017;123(18):3486-93.  --Among 8450 patients, 7819 (92.5%) were treated with CFRT, and 631 (7.5%) underwent SBRT. Receipt of SBRT was associated with superior OS in the multivariate analysis (hazard ratio, 0.84; 95% confidence interval, 0.75?0.93; P?<?.001). With propensity score matching, 988 patients in all were matched, with 494 patients in each cohort. Within the propensity?matched cohorts, the median OS (13.9 vs 11.6 months) and the 2?year OS rate (21.7% vs 16.5%) were significantly higher with SBRT versus CFRT (P?=?.0014).

## 2019-06-08 ENCOUNTER — Telehealth: Payer: Self-pay | Admitting: Hematology and Oncology

## 2019-06-08 NOTE — Telephone Encounter (Signed)
Scheduled per los. Called and spoke with patient. Confirmed appt 

## 2019-06-15 DIAGNOSIS — J4520 Mild intermittent asthma, uncomplicated: Secondary | ICD-10-CM | POA: Diagnosis not present

## 2019-06-15 DIAGNOSIS — J301 Allergic rhinitis due to pollen: Secondary | ICD-10-CM | POA: Diagnosis not present

## 2019-06-15 DIAGNOSIS — K219 Gastro-esophageal reflux disease without esophagitis: Secondary | ICD-10-CM | POA: Diagnosis not present

## 2019-06-15 DIAGNOSIS — J3089 Other allergic rhinitis: Secondary | ICD-10-CM | POA: Diagnosis not present

## 2019-06-17 MED FILL — AMLODIPINE BESYLATE 5 MG TA: 5 | 90 days supply | Qty: 90 | Fill #2

## 2019-06-23 ENCOUNTER — Other Ambulatory Visit: Payer: Medicare Other | Admitting: *Deleted

## 2019-06-23 ENCOUNTER — Other Ambulatory Visit: Payer: Self-pay

## 2019-06-23 DIAGNOSIS — Z515 Encounter for palliative care: Secondary | ICD-10-CM

## 2019-06-27 NOTE — Progress Notes (Signed)
COMMUNITY PALLIATIVE CARE RN NOTE  PATIENT NAME: [name] DOB: 10/12/1935 MRN: [id number]  PRIMARY CARE PROVIDER: Ronnald Nian, DO  RESPONSIBLE PARTY:  Acct ID - Guarantor Home Phone Work Phone Relationship Acct Type  [id number] [name]  Self P/F     [address], [place], [place] [address]   Due to the COVID-19 crisis, this virtual check-in visit was done via telephone from my office and it was initiated and consent by this patient and or family.  PLAN OF CARE and INTERVENTION:  1. ADVANCE CARE PLANNING/GOALS OF CARE: Goal is for patient to remain at home with her husband. She wishes that she had more energy.  2. PATIENT/CAREGIVER EDUCATION: Symptom management 3. DISEASE STATUS: Virtual check-in visit completed via telephone. She reports that she does experience pain around her abdomen. She says that they feel like spasms at times. She says that it is difficult to tell what is causing the pain between her pancreatic cancer, IBS and diverticulitis. She will take Tylenol if pain becomes less tolerable, and it does help some where she is able to rest. She denies dyspnea. Her biggest concern is becoming tired so easily. She walked outside up and down her street today and was exhausted afterwards. She is sleeping well during the night most of the time. If she does not sleep well, she will wake up and eat a snack and drink warm milk and is able to eventually fall back asleep. She does takes naps during the day. She is able to perform all ADLs independently. She has a good appetite. She does become nauseated at times. She will usually chew crystallized ginger which helps. However, last week she says that it took longer than usual to get relief. She has been trying to make sure that she stays hydrated. She drank some Gatorade this am. She is drinking a Protein drink at times for nutritional supplementation. Her bowel movement have been ok. She does have occasional diarrhea.  She had a recent follow-up appointment with her Oncologist. Labs were drawn. Most of her labwork was unremarkable. Glucose 158, Hgb 11.2, Hct 34.4. She had a CT scan last month which showed a slight increase in her pancreatic tumor size to 2.3 cm x 2.3 cm. Overall, her condition is stable. Her next follow-up appointment is in 6 weeks. Will continue to monitor.    HISTORY OF PRESENT ILLNESS:  This is a 84 yo female with a h/o pancreatic cancer stage 1A, CAD s/p CABG, Breast CA, OSA and GERD. Palliative care team continues to follow patient for additional support and visits monthly and PRN.  CODE STATUS: Full code ADVANCED DIRECTIVES: N MOST FORM: no PPS: 50%   (Duration of visit and documentation 30 minutes)   Daryl Eastern, RN BSN

## 2019-07-02 ENCOUNTER — Other Ambulatory Visit: Payer: Self-pay | Admitting: Gastroenterology

## 2019-07-07 ENCOUNTER — Other Ambulatory Visit: Payer: Medicare Other

## 2019-07-07 ENCOUNTER — Other Ambulatory Visit: Payer: Self-pay

## 2019-07-07 ENCOUNTER — Other Ambulatory Visit: Payer: Medicare Other | Admitting: *Deleted

## 2019-07-07 DIAGNOSIS — Z515 Encounter for palliative care: Secondary | ICD-10-CM

## 2019-07-11 NOTE — Progress Notes (Signed)
COMMUNITY PALLIATIVE CARE RN NOTE  PATIENT NAME: [name] DOB: 06/23/1935 MRN: [id number]  PRIMARY CARE PROVIDER: Ronnald Nian, DO  RESPONSIBLE PARTY:  Acct ID - Guarantor Home Phone Work Phone Relationship Acct Type  [id number] [name]  Self P/F     [address], [place], [place] [address]   Covid-19 Pre-screening Negative  PLAN OF CARE and INTERVENTION:  1. ADVANCE CARE PLANNING/GOALS OF CARE: Goal is for patient to remain at home with her husband and avoid hospitalizations. She is a Full code 2. PATIENT/CAREGIVER EDUCATION: Symptom management, pain management, safe mobility 3. DISEASE STATUS: Joint visit made with Palliative care SW, Monica Lonon. Met with patient, her husband and daughter, [name]. Patient remains alert and oriented x 3, pleasant and engaging. She does experience pain in her stomach and lower back. She continues to feel that Tylenol helps minimize her pain and make it more tolerable. She does not have to take Tylenol every day. She also has some nausea at times. She chews on a crystallized ginger stick to help. She is not interested in medications for nausea as of yet. She continues with a good appetite. She is taking all of her medications without difficulty and denies dysphagia. She remains ambulatory and able to perform ADLs independently. She says that in the mornings, her energy level seems to be ok. But as the day progresses she feels more tired and fatigued. She does take naps when she needs to. We had a lengthy family discussion regarding Advanced Directives. Assisted in helping patient know how to fill out necessary forms. She has not decided whether or not she wants to remain a Full code or become a DNR. She wants more time to think about this. She does say that she does NOT want a feeding tube. Also reviewed the MOST form and left a blank copy for both her and her husband in the home to discuss. Will continue to monitor.   HISTORY OF  PRESENT ILLNESS:  This is a 84 yo female with a diagnosis of Pancreatic Cancer. Palliative care team continues to follow patient and visits monthly and PRN.  CODE STATUS: Full code  ADVANCED DIRECTIVES: N  MOST FORM: no (form left in the home for pt/family to review) PPS: 50%   PHYSICAL EXAM:   LUNGS: clear to auscultation  CARDIAC: Cor RRR EXTREMITIES: No edema SKIN: Exposed skin is dry and intact  NEURO: Alert and oriented x 3, pleasant mood, ambulatory   (Duration of visit and documentation    Daryl Eastern, RN BSN

## 2019-07-12 NOTE — Progress Notes (Signed)
COMMUNITY PALLIATIVE CARE SW NOTE  PATIENT NAME: [name] DOB: December 03, 1935 MRN: [id number]  PRIMARY CARE PROVIDER: Ronnald Nian, DO  RESPONSIBLE PARTY:  Acct ID - Guarantor Home Phone Work Phone Relationship Acct Type  [id number] [name]  Self P/F     [address], [place], [place] [address]     PLAN OF CARE and INTERVENTIONS:             1. GOALS OF CARE/ ADVANCE CARE PLANNING: Patient's  goal to remain at home with her husband, as independent as possible. Patient would like to avoid any hospitalizations. Patient is a FULL CODE. 2. SOCIAL/EMOTIONAL/SPIRITUAL ASSESSMENT/ INTERVENTIONS:  SW and RN-Monishia .Howard completed face-to-face visit with patient as her home where she was present with her husband and daughter-Lisa. Patient is alert and oriented x3. She was pleasantly engaged and receptive to team. She report ongoing pain to there back and stomach that is being managed with Tylenol. She is also experiencing some intermittent nausea that she remedies by chewing on a crystallized ginger stick. She reports decreased energy. She is sleeping well most nights, but has some nights were she has difficulty staying asleep.She reports that her appetite is good, but often feels empty. Patient is independent of ADL's. SW provided education on advance directives, reviewing the healthcare power of attorney, living will and MOST form with her. The dialogue was in dept and the patient/family was able to ask question, clarify goals and understand how to fill the forms out-each section was reviewed and discussed at length. Patient's daughter took notes on the forms as they prepare to have the forms completed. Patient's code status was also discussed and education provided. Patient stated she will take time to think about it more and discuss it with her family before deciding on changing her code status. Patient remains a FULL CODE. 3. PATIENT/CAREGIVER EDUCATION/ COPING: Education  was provided on advanced directives: health care power of attorney, living will, MOST form and code status. Patient and her family seemed receptive and open to discussing advanced directives and expressed gratitude for the team spending time with them to ensure their questions were answer and having the conversation. Overall, patient and her family report that they are coping well. They appear to be a closely knit, loving and supportive of each other. No coping issues.  4. PERSONAL EMERGENCY PLAN: 911 can be activated for emergencies. 5. COMMUNITY RESOURCES COORDINATION/ HEALTH CARE NAVIGATION:  None at this time. 6. FINANCIAL/LEGAL CONCERNS/INTERVENTIONS:  No legal or financial concerns.      SOCIAL HX:  Social History   Tobacco Use  . Smoking status: Never Smoker  . Smokeless tobacco: Never Used  Substance Use Topics  . Alcohol use: No    Alcohol/week: 0.0 standard drinks    CODE STATUS:   Code Status: Prior FULL CODE ADVANCED DIRECTIVES: N Education provided MOST FORM COMPLETE: No, education provided. Form left in the home for review HOSPICE EDUCATION PROVIDED: No  PPS: Patient ambulates independently. She is independent for all ADL's. Patient is having increased fatigue.    Duration of visit and documentation: 90 minutes.    [address] [place], [place]

## 2019-07-15 ENCOUNTER — Other Ambulatory Visit: Payer: Self-pay | Admitting: Gastroenterology

## 2019-07-19 ENCOUNTER — Encounter: Payer: Self-pay | Admitting: Hematology and Oncology

## 2019-07-19 ENCOUNTER — Other Ambulatory Visit: Payer: Self-pay | Admitting: Hematology and Oncology

## 2019-07-19 ENCOUNTER — Other Ambulatory Visit: Payer: Self-pay

## 2019-07-19 ENCOUNTER — Inpatient Hospital Stay: Payer: Medicare Other | Attending: Hematology and Oncology | Admitting: Hematology and Oncology

## 2019-07-19 ENCOUNTER — Inpatient Hospital Stay: Payer: Medicare Other

## 2019-07-19 VITALS — BP 135/62 | HR 63 | Temp 97.9°F | Resp 128 | Ht <= 58 in | Wt 123.3 lb

## 2019-07-19 DIAGNOSIS — Z853 Personal history of malignant neoplasm of breast: Secondary | ICD-10-CM | POA: Diagnosis not present

## 2019-07-19 DIAGNOSIS — Z888 Allergy status to other drugs, medicaments and biological substances status: Secondary | ICD-10-CM | POA: Insufficient documentation

## 2019-07-19 DIAGNOSIS — D649 Anemia, unspecified: Secondary | ICD-10-CM | POA: Insufficient documentation

## 2019-07-19 DIAGNOSIS — M549 Dorsalgia, unspecified: Secondary | ICD-10-CM | POA: Insufficient documentation

## 2019-07-19 DIAGNOSIS — Z885 Allergy status to narcotic agent status: Secondary | ICD-10-CM | POA: Diagnosis not present

## 2019-07-19 DIAGNOSIS — E119 Type 2 diabetes mellitus without complications: Secondary | ICD-10-CM | POA: Diagnosis not present

## 2019-07-19 DIAGNOSIS — K219 Gastro-esophageal reflux disease without esophagitis: Secondary | ICD-10-CM | POA: Diagnosis not present

## 2019-07-19 DIAGNOSIS — Z8719 Personal history of other diseases of the digestive system: Secondary | ICD-10-CM | POA: Insufficient documentation

## 2019-07-19 DIAGNOSIS — E785 Hyperlipidemia, unspecified: Secondary | ICD-10-CM | POA: Diagnosis not present

## 2019-07-19 DIAGNOSIS — R109 Unspecified abdominal pain: Secondary | ICD-10-CM | POA: Diagnosis not present

## 2019-07-19 DIAGNOSIS — R197 Diarrhea, unspecified: Secondary | ICD-10-CM | POA: Insufficient documentation

## 2019-07-19 DIAGNOSIS — G4733 Obstructive sleep apnea (adult) (pediatric): Secondary | ICD-10-CM | POA: Insufficient documentation

## 2019-07-19 DIAGNOSIS — Z79899 Other long term (current) drug therapy: Secondary | ICD-10-CM | POA: Diagnosis not present

## 2019-07-19 DIAGNOSIS — I2510 Atherosclerotic heart disease of native coronary artery without angina pectoris: Secondary | ICD-10-CM | POA: Insufficient documentation

## 2019-07-19 DIAGNOSIS — C250 Malignant neoplasm of head of pancreas: Secondary | ICD-10-CM

## 2019-07-19 DIAGNOSIS — G893 Neoplasm related pain (acute) (chronic): Secondary | ICD-10-CM | POA: Insufficient documentation

## 2019-07-19 DIAGNOSIS — Z881 Allergy status to other antibiotic agents status: Secondary | ICD-10-CM | POA: Diagnosis not present

## 2019-07-19 DIAGNOSIS — C259 Malignant neoplasm of pancreas, unspecified: Secondary | ICD-10-CM

## 2019-07-19 DIAGNOSIS — Z880 Allergy status to penicillin: Secondary | ICD-10-CM | POA: Insufficient documentation

## 2019-07-19 LAB — CBC WITH DIFFERENTIAL (CANCER CENTER ONLY)
Abs Immature Granulocytes: 0.01 10*3/uL (ref 0.00–0.07)
Basophils Absolute: 0.1 10*3/uL (ref 0.0–0.1)
Basophils Relative: 1 %
Eosinophils Absolute: 0.2 10*3/uL (ref 0.0–0.5)
Eosinophils Relative: 4 %
HCT: 33.7 % — ABNORMAL LOW (ref 36.0–46.0)
Hemoglobin: 11.1 g/dL — ABNORMAL LOW (ref 12.0–15.0)
Immature Granulocytes: 0 %
Lymphocytes Relative: 29 %
Lymphs Abs: 1.7 10*3/uL (ref 0.7–4.0)
MCH: 30.3 pg (ref 26.0–34.0)
MCHC: 32.9 g/dL (ref 30.0–36.0)
MCV: 92.1 fL (ref 80.0–100.0)
Monocytes Absolute: 0.7 10*3/uL (ref 0.1–1.0)
Monocytes Relative: 12 %
Neutro Abs: 3.1 10*3/uL (ref 1.7–7.7)
Neutrophils Relative %: 54 %
Platelet Count: 249 10*3/uL (ref 150–400)
RBC: 3.66 MIL/uL — ABNORMAL LOW (ref 3.87–5.11)
RDW: 13.8 % (ref 11.5–15.5)
WBC Count: 5.7 10*3/uL (ref 4.0–10.5)
nRBC: 0 % (ref 0.0–0.2)

## 2019-07-19 LAB — CMP (CANCER CENTER ONLY)
ALT: 22 U/L (ref 0–44)
AST: 23 U/L (ref 15–41)
Albumin: 3.6 g/dL (ref 3.5–5.0)
Alkaline Phosphatase: 114 U/L (ref 38–126)
Anion gap: 7 (ref 5–15)
BUN: 8 mg/dL (ref 8–23)
CO2: 29 mmol/L (ref 22–32)
Calcium: 9.1 mg/dL (ref 8.9–10.3)
Chloride: 99 mmol/L (ref 98–111)
Creatinine: 0.75 mg/dL (ref 0.44–1.00)
GFR, Est AFR Am: >60 mL/min (ref >60)
GFR, Estimated: >60 mL/min (ref >60)
Glucose, Bld: 99 mg/dL (ref 70–99)
Potassium: 4.6 mmol/L (ref 3.5–5.1)
Sodium: 135 mmol/L (ref 135–145)
Total Bilirubin: 0.8 mg/dL (ref 0.3–1.2)
Total Protein: 6.9 g/dL (ref 6.5–8.1)

## 2019-07-19 LAB — LACTATE DEHYDROGENASE: LDH: 152 U/L (ref 98–192)

## 2019-07-19 NOTE — Progress Notes (Signed)
[facility] Telephone:([phone]) [phone]   Fax:(336) [phone]  PROGRESS NOTE  Patient Care Team: Ronnald Nian, DO as PCP - General (Family Medicine) Josue Hector, MD as PCP - Cardiology (Cardiology) Neldon Mc, MD as Surgeon (General Surgery) Gery Pray, MD (Radiation Oncology) Magrinat, Virgie Dad, MD (Hematology and Oncology) Cameron Sprang, MD as Consulting Physician (Neurology) Placke, Dawn, RN (Inactive) as Registered Nurse Placke, Dawn, RN (Inactive) as Oncology Nurse Navigator  Hematological/Oncological History  # Pancreatic Adenocarcinoma Stage IA (T1N0M0) 1) 11/26/2018: patient had visit with PCP for pruritis, found to have elevated LFTs 2) 11/27/2018: patient presented to the ED. Underwent CT scan which revealed 1.9 x 1.6 x 1.6 cm cm mass in the head of the pancreas with biliary duct dilation 3) 11/28/2018: ERCP performed to place stent for biliary obstruction  4) 12/10/2018: EUS w/ FNA reveals adenocarcinoma of the pancreas 5) 12/18/2018: establish care with Dr. Lorenso Courier  6) 12/23/2018: surgical visit with Dr. Barry Dienes 7) 12/30/2018: radiation oncology visit with Dr. Sondra Come. Agreeable to SBRT to pancreatic mass.  8) 01/2019: Radiation delayed due to infection with COVID-19.  9) 03/08/2019-03/18/2019: Palliative radiation therapy to pancreatic mass 10) 05/06/2019: CT scan abdomen shows modest increase in tumor size with no clear evidence of metastatic spread.   #Breast Cancer T2 N0, stage IIA invasive ductal carcinoma, grade 3,  ER+/PR+/HER-2 not amplified 1) December 2012:  leftlumpectomy and sentinel lymph node biopsy  2) March 2013: completed radiation therapy, started on letrozole 3) Feb 2018: completed letrozole therapy   HISTORY OF PRESENTING ILLNESS:  [name] 84 y.o. female with medical history significant for CAD s/p CABG in 2011, OSA on CPAP, Breast Cancer, and GERD who presents for a follow up for adenocarcinoma of the pancreas.  She was last seen in our clinic on 06/07/2019. In the interim she had no hospitalizations, ED visits, or imaging.  On exam today [name] notes she has been well overall since her last visit.  She notes that she does occasionally have a little bit of diarrhea, but that does not occur every single day.  She notes that she does continue to have a "empty" feeling within her stomach and that she does occasionally have some belly pain in the subxiphoid area.  She reports that she does have some continued back pain but this is diffuse and chronic.  She endorses having a good appetite and a stable weight since our last visit.  She has been taking Mylanta as well as simethicone as an attempt to decrease her gas and occasional stomach upset.    Her pain is currently well managed with Tylenol therapy alone.  She is currently taking 325 mg acetaminophen every 6 hours and is considering taking an additional 650 mg prior to bed.  Overall she otherwise denies any fevers, chills, sweats, nausea, vomiting or dark stools.  She has not had any itching or jaundice of the skin.  A full 10 point ROS elicited below.   MEDICAL HISTORY:  Past Medical History:  Diagnosis Date  . Asthma   . Atrophic vaginitis   . Breast cancer, Left 12/20/2010   NO BLOOD PRESSURE CHECKS OR STICKS IN LEFT ARM  . CAD (coronary artery disease)    a. 01/19/2010 s/p CABG x 3, lima->lad, vg->diag, vg->om1;  b. 06/2011 :Lexi MV: EF 84%, No ischemia. c. 01/06/14 s/p negative nuclear stress test with EF >70%  . Cervical arthritis   . Colonic polyp 04-27-2009   tubular adenoma  .  Diverticulosis of colon (without mention of hemorrhage)   . Fibromyalgia   . Gallstones   . GERD (gastroesophageal reflux disease)   . Glaucoma   . Headache(784.0)    a. frequently assocaited with high BPs  . Hiatal hernia   . Hypercholesterolemia   . Irritable bowel syndrome   . Labile hypertension   . Lymphedema    left arm  . Pancreatic cancer ([facility])   .  Pinched vertebral nerve   . Secondary diabetes ([facility]) 12/03/2018    SURGICAL HISTORY: Past Surgical History:  Procedure Laterality Date  . [place] STUDY N/A 01/08/2016   Procedure: [place] STUDY;  Surgeon: Ladene Artist, MD;  Location: [facility] ENDOSCOPY;  Service: Endoscopy;  Laterality: N/A;  . BILIARY BRUSHING  11/28/2018   Procedure: BILIARY BRUSHING;  Surgeon: Gatha Mayer, MD;  Location: [facility] ENDOSCOPY;  Service: Endoscopy;;  . BILIARY DILATION  01/06/2019   Procedure: BILIARY DILATION;  Surgeon: Irving Copas., MD;  Location: [facility] ENDOSCOPY;  Service: Endoscopy;;  . BILIARY STENT PLACEMENT  11/28/2018   Procedure: BILIARY STENT PLACEMENT;  Surgeon: Gatha Mayer, MD;  Location: [facility] ENDOSCOPY;  Service: Endoscopy;;  . BILIARY STENT PLACEMENT N/A 01/06/2019   Procedure: BILIARY STENT PLACEMENT;  Surgeon: Irving Copas., MD;  Location: [facility] ENDOSCOPY;  Service: Endoscopy;  Laterality: N/A;  . BREAST LUMPECTOMY Left 02/06/11  . CATARACT EXTRACTION, BILATERAL     bilateral caaract removal,  . COLONOSCOPY    . CORONARY ANGIOPLASTY WITH STENT PLACEMENT     Stent 2007  . CORONARY ARTERY BYPASS GRAFT    . ENDOSCOPIC RETROGRADE CHOLANGIOPANCREATOGRAPHY (ERCP) WITH PROPOFOL N/A 01/06/2019   Procedure: ENDOSCOPIC RETROGRADE CHOLANGIOPANCREATOGRAPHY (ERCP) WITH PROPOFOL;  Surgeon: Rush Landmark Telford Nab., MD;  Location: [facility] ENDOSCOPY;  Service: Endoscopy;  Laterality: N/A;  [id number]  . ERCP N/A 11/28/2018   Procedure: ENDOSCOPIC RETROGRADE CHOLANGIOPANCREATOGRAPHY (ERCP);  Surgeon: Gatha Mayer, MD;  Location: [facility] ENDOSCOPY;  Service: Endoscopy;  Laterality: N/A;  . ESOPHAGEAL MANOMETRY N/A 01/08/2016   Procedure: ESOPHAGEAL MANOMETRY (EM);  Surgeon: Ladene Artist, MD;  Location: [facility] ENDOSCOPY;  Service: Endoscopy;  Laterality: N/A;  . ESOPHAGOGASTRODUODENOSCOPY    . ESOPHAGOGASTRODUODENOSCOPY N/A 01/06/2019   Procedure: ESOPHAGOGASTRODUODENOSCOPY (EGD);  Surgeon: Rush Landmark  Telford Nab., MD;  Location: [facility];  Service: Endoscopy;  Laterality: N/A;  . ESOPHAGOGASTRODUODENOSCOPY (EGD) WITH PROPOFOL N/A 12/10/2018   Procedure: ESOPHAGOGASTRODUODENOSCOPY (EGD) WITH PROPOFOL;  Surgeon: [name], MD;  Location: [facility] ENDOS[facility];  Service: Endoscopy;  Laterality: N/A;  . EUS N/A 12/10/2018   Procedure: UPPER ENDOSCOPIC ULTRASOUND (EUS) RADIAL;  Surgeon: [name], MD;  Location: [facility] END[facility];  Service: Endoscopy;  Laterality: N/A;  . EUS N/A 01/06/2019   Procedure: FULL UPPER ENDOSCOPIC ULTRASOUND (EUS) RADIAL;  Surgeon: Rush Landmark Telford Nab., MD;  Location: [facility] END[facility];  Service: Endoscopy;  Laterality: N/A;  . FIDUCIAL MARKER PLACEMENT  01/06/2019   Procedure: FIDUCIAL MARKER PLACEMENT;  Surgeon: Rush Landmark Telford Nab., MD;  Location: [facility] ENDOSCOPY;  Service: Endoscopy;;  . FINE NEEDLE ASPIRATION N/A 12/10/2018   Procedure: FINE NEEDLE ASPIRATION (FNA) LINEAR;  Surgeon: [name], MD;  Location: [facility] ENDOSCOPY;  Service: Endoscopy;  Laterality: N/A;  . SPHINCTEROTOMY  01/06/2019   Procedure: SPHINCTEROTOMY;  Surgeon: Rush Landmark Telford Nab., MD;  Location: [facility] ENDOSCOPY;  Service: Endoscopy;;  . STENT REMOVAL  01/06/2019   Procedure: STENT REMOVAL;  Surgeon: Irving Copas., MD;  Location: [facility] ENDOSCOPY;  Service: Endoscopy;;    ALLERGIES:  is  allergic to levaquin [levofloxacin in d5w]; choline fenofibrate; hctz [hydrochlorothiazide]; simvastatin; adhesive [tape]; bentyl [dicyclomine hcl]; ceclor [cefaclor]; clarithromycin; codeine; doxycycline; hydrocodone; lisinopril; penicillins; and tobramycin-dexamethasone.  MEDICATIONS:  Current Outpatient Medications  Medication Sig Dispense Refill  . acetaminophen (TYLENOL) 325 MG tablet Take 650 mg by mouth every 6 (six) hours as needed.    . simethicone (MYLICON) 921 MG chewable tablet Chew 125 mg by mouth every 6 (six) hours as needed for flatulence.    [name] albuterol (VENTOLIN HFA) 108 (90  Base) MCG/ACT inhaler Inhale 2 puffs into the lungs every 6 (six) hours as needed for wheezing or shortness of breath.     [name] amLODipine (NORVASC) 5 MG tablet Take 1 tablet (5 mg total) by mouth daily. 90 tablet 3  . aspirin EC 81 MG tablet Take 81 mg by mouth daily.    [name] CALCIUM PO Take 1 tablet by mouth daily with breakfast.     . diphenhydrAMINE (BENADRYL) 50 MG tablet Take 0.5 tablets (25 mg total) by mouth 2 (two) times daily. 30 tablet 0  . EPINEPHrine 0.3 mg/0.3 mL IJ SOAJ injection Inject 0.3 mg into the muscle as needed (as directed- FOR A SEVERE REACTION).     [name] fexofenadine (ALLEGRA) 180 MG tablet Take 180 mg by mouth every other day.     . fluticasone (FLONASE) 50 MCG/ACT nasal spray Place 1-2 sprays into both nostrils daily as needed for allergies or rhinitis.    . Glucosamine-Chondroit-Vit C-Mn (GLUCOSAMINE CHONDR 1500 COMPLX PO) Take 1 tablet by mouth 2 (two) times daily.     . hydrALAZINE (APRESOLINE) 50 MG tablet Take 0.5 tablets (25 mg total) by mouth 3 (three) times daily. (Patient taking differently: Take 25 mg by mouth daily. ) 135 tablet 1  . latanoprost (XALATAN) 0.005 % ophthalmic solution Place 1 drop into both eyes at bedtime.     [name] losartan (COZAAR) 100 MG tablet TAKE 1/2 TABLET BY MOUTH THREE TIMES A DAY (Patient taking differently: 50 mg. TAKE 1/2 TABLET BY MOUTH QD) 90 tablet 0  . metoprolol tartrate (LOPRESSOR) 50 MG tablet Take 1 tablet (50 mg total) by mouth 3 (three) times daily. 270 tablet 3  . nitroGLYCERIN (NITROSTAT) 0.4 MG SL tablet Place 1 tablet (0.4 mg total) under the tongue every 5 (five) minutes as needed for chest pain. 25 tablet 2  . pantoprazole (PROTONIX) 40 MG tablet TAKE 1 TABLET BY MOUTH TWICE DAILY( 30 TO 60 MINUTES BEFORE BREAKFAST) 60 tablet 5  . Psyllium (METAMUCIL FIBER PO) Take 1 Scoop by mouth daily. Mix one rounded teaspoonful into 8 oz water and drink once a day    . rosuvastatin (CRESTOR) 5 MG tablet TAKE 1 TABLET BY MOUTH 3 TIMES A WEEK.  (TAKE ON MONDAY, WEDNESDAY, AND FRIDAY) 36 tablet 3  . sodium chloride (OCEAN) 0.65 % SOLN nasal spray Place 1 spray into both nostrils daily as needed for congestion.     . sucralfate (CARAFATE) 1 g tablet TAKE 1 TABLET BY MOUTH BY MOUTH TWICE DAILY(ONE BEFORE DINNER) 60 tablet 1  . traMADol (ULTRAM) 50 MG tablet Take 1/2 to 1 tablet by mouth three times daily as needed (Patient taking differently: Take 25-80 mg by mouth every 6 (six) hours as needed for moderate pain. ) 30 tablet 1  . vitamin B-12 (CYANOCOBALAMIN) 1000 MCG tablet Take 1,000 mcg by mouth daily.      [name] VITAMIN D PO Take 200 mcg by mouth daily with breakfast. Vitamin A  300  mcg Calcium  40 mcg     No current facility-administered medications for this visit.    REVIEW OF SYSTEMS:   Constitutional: ( - ) fevers, ( - )  chills , ( - ) night sweats Eyes: ( - ) blurriness of vision, ( - ) double vision, ( - ) watery eyes Ears, nose, mouth, throat, and face: ( - ) mucositis, ( - ) sore throat Respiratory: ( - ) cough, ( - ) dyspnea, ( - ) wheezes Cardiovascular: ( - ) palpitation, ( - ) chest discomfort, ( - ) lower extremity swelling Gastrointestinal:  ( + ) nausea, ( - ) heartburn, ( - ) change in bowel habits Skin: ( - ) abnormal skin rashes Lymphatics: ( - ) new lymphadenopathy, ( - ) easy bruising Neurological: ( - ) numbness, ( - ) tingling, ( - ) new weaknesses Behavioral/Psych: ( - ) mood change, ( - ) new changes  All other systems were reviewed with the patient and are negative.  PHYSICAL EXAMINATION: ECOG PERFORMANCE STATUS: 2 - Symptomatic, <50% confined to bed  Vitals:   07/19/19 1051  BP: 135/62  Pulse: 63  Resp: (!) 128  Temp: 97.9 F (36.6 C)  SpO2: 98%   Filed Weights   07/19/19 1051  Weight: 123 lb 4.8 oz (55.9 kg)    GENERAL: pleasant, well appearing elderly Caucasian female in NAD  SKIN: skin color, texture, turgor are normal, no rashes or significant lesions EYES: conjunctiva are pink and  non-injected. No jaundice evident in the sclera.  LUNGS: clear to auscultation and percussion with normal breathing effort HEART: regular rate & rhythm and no murmurs and no lower extremity edema ABDOMEN: soft and non-distended.  Musculoskeletal: no cyanosis of digits and no clubbing  PSYCH: alert & oriented x 3, fluent speech NEURO: no focal motor/sensory deficits  LABORATORY DATA:  I have reviewed the data as listed CMP Latest Ref Rng & Units 07/19/2019 06/07/2019 04/26/2019  Glucose 70 - 99 mg/dL 99 158(H) 136(H)  BUN 8 - 23 mg/dL _0 Creatinine 0.44 - 1.00 mg/dL 0.75 0.78 1.00  Sodium 135 - 145 mmol/L 135 136 135  Potassium 3.5 - 5.1 mmol/L 4.6 4.5 4.0  Chloride 98 - 111 mmol/L 99 98 99  CO2 22 - 32 mmol/L _1 Calcium 8.9 - 10.3 mg/dL 9.1 8.9 8.9  Total Protein 6.5 - 8.1 g/dL 6.9 6.9 7.0  Total Bilirubin 0.3 - 1.2 mg/dL 0.8 1.0 0.8  Alkaline Phos 38 - 126 U/L 114 115 108  AST 15 - 41 U/L _2 ALT 0 - 44 U/L _3 CBC Latest Ref Rng & Units 07/19/2019 06/07/2019 04/26/2019  WBC 4.0 - 10.5 K/uL 5.7 5.8 5.4  Hemoglobin 12.0 - 15.0 g/dL 11.1(L) 11.2(L) 11.6(L)  Hematocrit 36.0 - 46.0 % 33.7(L) 34.4(L) 35.1(L)  Platelets 150 - 400 K/uL 249 268 227    RADIOGRAPHIC STUDIES: No new interval imaging since last visit. Next CT scan due 08/03/2019  ASSESSMENT & PLAN [name] 84 y.o. female with medical history significant for CAD s/p CABG in 2011, OSA on CPAP, Breast Cancer, and GERD who presents for a follow up for localized adenocarcinoma of the pancreas.  After review the labs and discussion with [name] her clinical status appears stable.  Her bilirubin and LFTs are within normal limits.  Her CBC shows a stable mild anemia, but is otherwise normal.  Symptomatically she is continuing  to have some bandlike abdominal pain but this is well controlled with as needed Tylenol [phone] mg PRN.  At this time there is no need for a change in her current plan.   We  will continue with symptom management and close monitoring of her labs today give her a better idea of life expectancy/progression of disease. We will see her back in 6 weeks time for continued monitoring (6 weeks per her request).   #Localized Adenocarcinoma of the Pancreas, Stage IA (T1N0M0) --Completed palliatve radiation therapy on 03/18/2019  --after reviewing patients functional status and ECOG 2 I think the patient is possible a candidate for chemotherapy (likely only monotherapy gemcitabine (J Clin Oncol. 1997 Jun; 15(6):2403-13.)). This could be considered after radiatiation, though I do not favor that approach. The patient and her daughter are OK with continued comfort based care. --last CT scan on 05/06/2019 showed  Mild increase in size of tumor (2.3 x 2.3 cm). Repeat CT scan ordered for June 2021, per patient request.  --for now continued supportive care and symptom management.  --RTC in 6 weeks to assess symptoms.   #Breast Cancer T2 N0, stage IIA invasive ductal carcinoma, grade 3,  ER+/PR+/HER-2 not amplified --completed 84 year old letrozole therapy in Feb 2018 --previously followed by Dr. Gunnar Bulla Magrinat at [facility] --in remission   #Cancer Related Pain, stable --reports stable pain today, still across the top of the abdomen. --now taking [phone] mg tylenol PRN --LFTs have normalized, OK to take tylenol at above dosage --will continue to monitor for abdominal pain  #Symptom Management --notes light colored stools that float, but no diarrhea. Possible initial signs of pancreatic insufficiency. Continue to monitor and consider pancreatic enzymes vs low fat diet.  --no itching or jaundice today. Continue to monitor. If worsening, could be sign of stent obstruction.  --mild nausea, at patient's baseline. Treated with ginger. No diarrhea  #Goals Of Care --patient notes she wants to maintain a high quality of life and does not think that surgery/chemotherapy would allow that. I agree  with her assessment --referred to [facility] care for introductions, assistance with symptoms management, and preliminary hospice discussions.   All questions were answered. The patient knows to call the clinic with any problems, questions or concerns.  A total of more than 30 minutes were spent face-to-face with the patient during this encounter and over half of that time was spent on counseling and coordination of care as outlined above.   Ledell Peoples, MD Department of Hematology/Oncology [place] [facility] Phone: [phone] Pager: [phone] Email: [email or web address]   07/19/2019 3:57 PM   Literature Support:  Nyra Capes, Switchenko Hewitt Shorts RJ, [place], [place] T, et al. Outcomes for patients with locally advanced pancreatic adenocarcinoma treated with stereotactic body radiation therapy versus conventionally fractionated radiation. Cancer. 2017;123(18):3486-93.  --Among 8450 patients, 7819 (92.5%) were treated with CFRT, and 631 (7.5%) underwent SBRT. Receipt of SBRT was associated with superior OS in the multivariate analysis (hazard ratio, 0.84; 95% confidence interval, 0.75?0.93; P?<?.001). With propensity score matching, 988 patients in all were matched, with 494 patients in each cohort. Within the propensity?matched cohorts, the median OS (13.9 vs 11.6 months) and the 2?year OS rate (21.7% vs 16.5%) were significantly higher with SBRT versus CFRT (P?=?.0014).

## 2019-07-27 ENCOUNTER — Other Ambulatory Visit: Payer: Self-pay

## 2019-07-28 ENCOUNTER — Ambulatory Visit (INDEPENDENT_AMBULATORY_CARE_PROVIDER_SITE_OTHER): Payer: Medicare Other | Admitting: Family Medicine

## 2019-07-28 ENCOUNTER — Encounter: Payer: Self-pay | Admitting: Family Medicine

## 2019-07-28 VITALS — BP 110/60 | HR 91 | Temp 97.7°F | Ht <= 58 in | Wt 122.6 lb

## 2019-07-28 DIAGNOSIS — K208 Other esophagitis without bleeding: Secondary | ICD-10-CM | POA: Diagnosis not present

## 2019-07-28 DIAGNOSIS — K2100 Gastro-esophageal reflux disease with esophagitis, without bleeding: Secondary | ICD-10-CM

## 2019-07-28 DIAGNOSIS — R1310 Dysphagia, unspecified: Secondary | ICD-10-CM | POA: Diagnosis not present

## 2019-07-28 DIAGNOSIS — T50905A Adverse effect of unspecified drugs, medicaments and biological substances, initial encounter: Secondary | ICD-10-CM | POA: Diagnosis not present

## 2019-07-28 NOTE — Progress Notes (Signed)
[name] is a 84 y.o. female  Chief Complaint  Patient presents with   Throat feeling tight and coughing    Monday evening it felt like her medication got caught in her throat, with a burning sensation.    HPI: [name] is a 84 y.o. female who states when she took her evening meds 2 nights ago she felt like a pill (amlodipine) "got caught" in her throat. She drank a lot of water and she felt like the pill dissolved. She was left with a residual "burning" sensation and a "tightness". She ate some yogurt which helped soothe it a bit. Denies SOB, CP. She has since been able to tolerate foods, liquids, meds without issue. She states she has been careful to chew food well.   She notes 3 episodes of a piece of food "getting hung up" in her throat prior to above incident.  She has a h/o GERD, she follows with Dr. Fuller Plan w/ [facility].    Past Medical History:  Diagnosis Date   Asthma    Atrophic vaginitis    Breast cancer, Left 12/20/2010   NO BLOOD PRESSURE CHECKS OR STICKS IN LEFT ARM   CAD (coronary artery disease)    a. 01/19/2010 s/p CABG x 3, lima->lad, vg->diag, vg->om1;  b. 06/2011 :Lexi MV: EF 84%, No ischemia. c. 01/06/14 s/p negative nuclear stress test with EF >70%   Cervical arthritis    Colonic polyp 04-27-2009   tubular adenoma   Diverticulosis of colon (without mention of hemorrhage)    Fibromyalgia    Gallstones    GERD (gastroesophageal reflux disease)    Glaucoma    Headache(784.0)    a. frequently assocaited with high BPs   Hiatal hernia    Hypercholesterolemia    Irritable bowel syndrome    Labile hypertension    Lymphedema    left arm   Pancreatic cancer ([place])    Pinched vertebral nerve    Secondary diabetes ([place]) 12/03/2018    Past Surgical History:  Procedure Laterality Date   15 HOUR [place] STUDY N/A 01/08/2016   Procedure: 24 HOUR [place] STUDY;  Surgeon: Ladene Artist, MD;  Location: [facility] ENDOSCOPY;  Service: Endoscopy;   Laterality: N/A;   BILIARY BRUSHING  11/28/2018   Procedure: BILIARY BRUSHING;  Surgeon: Gatha Mayer, MD;  Location: [facility] ENDOSCOPY;  Service: Endoscopy;;   BILIARY DILATION  01/06/2019   Procedure: BILIARY DILATION;  Surgeon: Irving Copas., MD;  Location: [facility]OSCOPY;  Service: Endoscopy;;   BILIARY STENT PLACEMENT  11/28/2018   Procedure: BILIARY STENT PLACEMENT;  Surgeon: Gatha Mayer, MD;  Location: [place];  Service: Endoscopy;;   BILIARY STENT PLACEMENT N/A 01/06/2019   Procedure: BILIARY STENT PLACEMENT;  Surgeon: Irving Copas., MD;  Location: [facility];  Service: Endoscopy;  Laterality: N/A;   BREAST LUMPECTOMY Left 02/06/11   CATARACT EXTRACTION, BILATERAL     bilateral caaract removal,   COLONOSCOPY     CORONARY ANGIOPLASTY WITH STENT PLACEMENT     Stent 2007   CORONARY ARTERY BYPASS GRAFT     ENDOSCOPIC RETROGRADE CHOLANGIOPANCREATOGRAPHY (ERCP) WITH PROPOFOL N/A 01/06/2019   Procedure: ENDOSCOPIC RETROGRADE CHOLANGIOPANCREATOGRAPHY (ERCP) WITH PROPOFOL;  Surgeon: Irving Copas., MD;  Location: [facility];  Service: Endoscopy;  Laterality: N/A;  [id number]   ERCP N/A 11/28/2018   Procedure: ENDOSCOPIC RETROGRADE CHOLANGIOPANCREATOGRAPHY (ERCP);  Surgeon: Gatha Mayer, MD;  Location: [facility] ENDOSCOPY;  Service: Endoscopy;  Laterality: N/A;   ESOPHAGEAL MANOMETRY  N/A 01/08/2016   Procedure: ESOPHAGEAL MANOMETRY (EM);  Surgeon: Ladene Artist, MD;  Location: [facility];  Service: Endoscopy;  Laterality: N/A;   ESOPHAGOGASTRODUODENOSCOPY     ESOPHAGOGASTRODUODENOSCOPY N/A 01/06/2019   Procedure: ESOPHAGOGASTRODUODENOSCOPY (EGD);  Surgeon: Rush Landmark Telford Nab., MD;  Location: [facility];  Service: Endoscopy;  Laterality: N/A;   ESOPHAGOGASTRODUODENOSCOPY (EGD) WITH PROPOFOL N/A 12/10/2018   Procedure: ESOPHAGOGASTRODUODENOSCOPY (EGD) WITH PROPOFOL;  Surgeon: [name], MD;  Location: [facility];  Service: Endoscopy;   Laterality: N/A;   EUS N/A 12/10/2018   Procedure: UPPER ENDOSCOPIC ULTRASOUND (EUS) RADIAL;  Surgeon: [name], MD;  Location: [facility];  Service: Endoscopy;  Laterality: N/A;   EUS N/A 01/06/2019   Procedure: FULL UPPER ENDOSCOPIC ULTRASOUND (EUS) RADIAL;  Surgeon: Rush Landmark Telford Nab., MD;  Location: [facility];  Service: Endoscopy;  Laterality: N/A;   FIDUCIAL MARKER PLACEMENT  01/06/2019   Procedure: FIDUCIAL MARKER PLACEMENT;  Surgeon: Irving Copas., MD;  Location: [facility];  Service: Endoscopy;;   FINE NEEDLE ASPIRATION N/A 12/10/2018   Procedure: FINE NEEDLE ASPIRATION (FNA) LINEAR;  Surgeon: [name], MD;  Location: [facility];  Service: Endoscopy;  Laterality: N/A;   SPHINCTEROTOMY  01/06/2019   Procedure: SPHINCTEROTOMY;  Surgeon: Mansouraty, Telford Nab., MD;  Location: [facility];  Service: Endoscopy;;   STENT REMOVAL  01/06/2019   Procedure: STENT REMOVAL;  Surgeon: Irving Copas., MD;  Location: [facility];  Service: Endoscopy;;    Social History   Socioeconomic History   Marital status: Married    Spouse name: Gildardo Griffes. [place]   Number of children: 2   Years of education: 12   Highest education level: Not on file  Occupational History   Occupation: retired    Fish farm manager: [employer]  Tobacco Use   Smoking status: Never Smoker   Smokeless tobacco: Never Used  Substance and Sexual Activity   Alcohol use: No    Alcohol/week: 0.0 standard drinks   Drug use: No   Sexual activity: Not Currently    Partners: Male    Birth control/protection: Post-menopausal  Other Topics Concern   Not on file  Social History Narrative   Patient lives at home with her husband Marcello Moores). 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    Caffeine- sometimes- One cup of coffee.   Right handed.   Two daughters   Four granddaughters.   No EtOH, Tobacco, drugs   Social  Determinants of Health   Financial Resource Strain:    Difficulty of Paying Living Expenses:   Food Insecurity:    Worried About Charity fundraiser in the Last Year:    Arboriculturist in the Last Year:   Transportation Needs:    Film/video editor (Medical):    Lack of Transportation (Non-Medical):   Physical Activity:    Days of Exercise per Week:    Minutes of Exercise per Session:   Stress:    Feeling of Stress :   Social Connections:    Frequency of Communication with Friends and Family:    Frequency of Social Gatherings with Friends and Family:    Attends Religious Services:    Active Member of Clubs or Organizations:    Attends Archivist Meetings:    Marital Status:   Intimate Partner Violence:    Fear of Current or Ex-Partner:    Emotionally Abused:    Physically Abused:  Sexually Abused:     Family History  Problem Relation Age of Onset   Stroke Mother    Stroke Father    Prostate cancer Father    Breast cancer Sister    Diabetes Sister    Cancer Brother        gland cancer   Heart disease Brother    Colon cancer Neg Hx    Stomach cancer Neg Hx    Pancreatic cancer Neg Hx    Kidney disease Neg Hx    Liver disease Neg Hx      Immunization History  Administered Date(s) Administered   Influenza Split 12/07/2010, 11/29/2011, 11/25/2012, 11/25/2013   Influenza Whole 12/19/2005, 11/25/2007, 12/08/2008, 12/20/2009   Influenza, High Dose Seasonal PF 12/20/2015, 12/19/2016   Influenza,inj,Quad PF,6+ Mos 12/20/2014   Influenza,inj,quad, With Preservative 11/25/2016   Influenza-Unspecified 12/09/2017   Pneumococcal Conjugate-13 04/13/2014   Pneumococcal Polysaccharide-23 02/25/2005   Td 07/25/2009   Zoster 03/08/2013    Outpatient Encounter Medications as of 07/28/2019  Medication Sig   acetaminophen (TYLENOL) 325 MG tablet Take 650 mg by mouth every 6 (six) hours as needed.   albuterol (VENTOLIN  HFA) 108 (90 Base) MCG/ACT inhaler Inhale 2 puffs into the lungs every 6 (six) hours as needed for wheezing or shortness of breath.    amLODipine (NORVASC) 5 MG tablet Take 1 tablet (5 mg total) by mouth daily.   aspirin EC 81 MG tablet Take 81 mg by mouth daily.   diphenhydrAMINE (BENADRYL) 50 MG tablet Take 0.5 tablets (25 mg total) by mouth 2 (two) times daily.   EPINEPHrine 0.3 mg/0.3 mL IJ SOAJ injection Inject 0.3 mg into the muscle as needed (as directed- FOR A SEVERE REACTION).    fexofenadine (ALLEGRA) 180 MG tablet Take 180 mg by mouth every other day.    fluticasone (FLONASE) 50 MCG/ACT nasal spray Place 1-2 sprays into both nostrils daily as needed for allergies or rhinitis.   hydrALAZINE (APRESOLINE) 50 MG tablet Take 0.5 tablets (25 mg total) by mouth 3 (three) times daily. (Patient taking differently: Take 25 mg by mouth daily. )   latanoprost (XALATAN) 0.005 % ophthalmic solution Place 1 drop into both eyes at bedtime.    losartan (COZAAR) 100 MG tablet TAKE 1/2 TABLET BY MOUTH THREE TIMES A DAY (Patient taking differently: 50 mg. TAKE 1/2 TABLET BY MOUTH QD)   metoprolol tartrate (LOPRESSOR) 50 MG tablet Take 1 tablet (50 mg total) by mouth 3 (three) times daily.   nitroGLYCERIN (NITROSTAT) 0.4 MG SL tablet Place 1 tablet (0.4 mg total) under the tongue every 5 (five) minutes as needed for chest pain.   pantoprazole (PROTONIX) 40 MG tablet TAKE 1 TABLET BY MOUTH TWICE DAILY( 30 TO 60 MINUTES BEFORE BREAKFAST)   Psyllium (METAMUCIL FIBER PO) Take 1 Scoop by mouth daily. Mix one rounded teaspoonful into 8 oz water and drink once a day   rosuvastatin (CRESTOR) 5 MG tablet TAKE 1 TABLET BY MOUTH 3 TIMES A WEEK. (TAKE ON MONDAY, WEDNESDAY, AND FRIDAY)   simethicone (MYLICON) 0000000 MG chewable tablet Chew 125 mg by mouth every 6 (six) hours as needed for flatulence.   sodium chloride (OCEAN) 0.65 % SOLN nasal spray Place 1 spray into both nostrils daily as needed for  congestion.    sucralfate (CARAFATE) 1 g tablet TAKE 1 TABLET BY MOUTH BY MOUTH TWICE DAILY(ONE BEFORE DINNER)   traMADol (ULTRAM) 50 MG tablet Take 1/2 to 1 tablet by mouth three times daily as needed (  Patient taking differently: Take 25-80 mg by mouth every 6 (six) hours as needed for moderate pain. )   vitamin B-12 (CYANOCOBALAMIN) 1000 MCG tablet Take 1,000 mcg by mouth daily.     VITAMIN D PO Take 200 mcg by mouth daily with breakfast. Vitamin A  300 mcg Calcium  40 mcg   CALCIUM PO Take 1 tablet by mouth daily with breakfast.    Glucosamine-Chondroit-Vit C-Mn (GLUCOSAMINE CHONDR 1500 COMPLX PO) Take 1 tablet by mouth 2 (two) times daily.    ipratropium (ATROVENT) 0.03 % nasal spray    No facility-administered encounter medications on file as of 07/28/2019.     ROS: Pertinent positives and negatives noted in HPI. Remainder of ROS non-contributory    Allergies  Allergen Reactions   Levaquin [Levofloxacin In D5w]     Elevated BP   Choline Fenofibrate Other (See Comments)     pt states INTOL to Trilipix w/ "thigh burning"   Hctz [Hydrochlorothiazide]     Causes hyponatremia   Simvastatin Other (See Comments)     pt states INTOL to STATINS \\T \ refuses to restart   Adhesive [Tape] Rash   Bentyl [Dicyclomine Hcl] Other (See Comments)    "Made me feel weird and drained me"   Ceclor [Cefaclor] Rash   Clarithromycin Rash   Codeine Nausea Only   Doxycycline Rash   Hydrocodone     Nightmare after taking cough syrup w/hydrocodone   Lisinopril Cough    Developed ACE cough...   Penicillins Itching, Rash and Other (See Comments)    At injection site Did it involve swelling of the face/tongue/throat, SOB, or low BP? No Did it involve sudden or severe rash/hives, skin peeling, or any reaction on the inside of your mouth or nose? No Did you need to seek medical attention at a hospital or doctor's office? No When did it last happen?"It was a long time ago" If all  above answers are "NO", may proceed with cephalosporin use.    Tobramycin-Dexamethasone Rash    LMP 11/26/1990   Physical Exam  Constitutional: She is oriented to person, place, and time. She appears well-developed and well-nourished.  Pulmonary/Chest: No stridor.  Abdominal: Soft. Bowel sounds are normal. She exhibits no distension. There is no abdominal tenderness.  Musculoskeletal:     Cervical back: Neck supple.  Lymphadenopathy:    She has no cervical adenopathy.  Neurological: She is alert and oriented to person, place, and time.  Psychiatric: She has a normal mood and affect. Her behavior is normal.     A/P:  1. Pill esophagitis 2. Gastroesophageal reflux disease with esophagitis without hemorrhage 3. Dysphagia, unspecified type - 3 episodes of dysphagia with food, 1 with med (amlodipine) in the past few months, most recent 2 days ago - pt with h/o GERD, on pantoprazole and will continue  - follows with GI and recommended f/u with Dr. Fuller Plan. Pt will call [facility] to schedule   This visit occurred during the SARS-CoV-2 public health emergency.  Safety protocols were in place, including screening questions prior to the visit, additional usage of staff PPE, and extensive cleaning of exam room while observing appropriate contact time as indicated for disinfecting solutions.

## 2019-08-03 ENCOUNTER — Ambulatory Visit (HOSPITAL_COMMUNITY)
Admission: RE | Admit: 2019-08-03 | Discharge: 2019-08-03 | Disposition: A | Discharge status: Home / Self Care | Payer: Medicare Other | Source: Ambulatory Visit | Attending: Hematology and Oncology | Admitting: Hematology and Oncology

## 2019-08-03 ENCOUNTER — Other Ambulatory Visit: Payer: Self-pay

## 2019-08-03 ENCOUNTER — Encounter (HOSPITAL_COMMUNITY): Payer: Self-pay

## 2019-08-03 DIAGNOSIS — C259 Malignant neoplasm of pancreas, unspecified: Secondary | ICD-10-CM

## 2019-08-03 MED ORDER — IOHEXOL 300 MG/ML  SOLN
100.0000 mL | Freq: Once | INTRAMUSCULAR | Status: AC | PRN
  Administered 2019-08-03: 100 mL via INTRAVENOUS

## 2019-08-03 MED ORDER — SODIUM CHLORIDE (PF) 0.9 % IJ SOLN
INTRAMUSCULAR | Status: AC
  Filled 2019-08-03: qty 50

## 2019-08-04 ENCOUNTER — Telehealth: Payer: Self-pay

## 2019-08-04 ENCOUNTER — Telehealth: Payer: Self-pay | Admitting: *Deleted

## 2019-08-04 NOTE — Telephone Encounter (Signed)
-----   Message from Orson Slick, MD sent at 08/04/2019 10:21 AM EDT ----- Please let [name] know we have the results of her CT scan. Findings show her tumor has decreased in size with no evidence of spread elsewhere. The tumor was  2.3 x 2.3 cm but is now 2.1 x 1.7 cm. Overall this is good news, showing local control of the tumor with radiation therapy.   Colan Neptune  ----- Message ----- From: Buel Ream, Rad Results In Sent: 08/03/2019   1:38 PM EDT To: Orson Slick, MD

## 2019-08-04 NOTE — Telephone Encounter (Signed)
Patient's daughter called and LVM to obtain pt's CT Abdomen results from 08/03/2019. Patient called and LVM on her machine that Dr. Sondra Come was given the message.

## 2019-08-04 NOTE — Telephone Encounter (Signed)
TCT patient regarding results of her recent CT scan. Spoke with patient and informed her that her pancreas tumor has decreased in size and that there is no evidence of tumor spread elsewhere. Advised that this shows local control of tumor growth with previous radiation therapy.  [name] asked about chemo therapy possibilities.  Advised that it was not off the table  For consideration and that [name] can discuss with her at her next  appt on 09/01/19. She does have an appt with [name] on 08/12/19 as well. Pt voiced understanding to all of the above. Overall she feels well, though the scan itself and the contrast wore her out.  She is better today.

## 2019-08-11 NOTE — Progress Notes (Signed)
Radiation Oncology         [phone]) [id number] ________________________________  Name: [name] MRN: [id number]  Date: 08/12/2019  DOB: 1935-11-10  Follow-Up Visit Note  CC: Ronnald Nian, DO  Stark Klein, MD    ICD-10-CM   1. Adenocarcinoma of head of pancreas ([place])  C25.0     Diagnosis: Adenocarcinoma of the pancreatic head,uT2N1  Interval Since Last Radiation: Four months, three weeks, and six days  Radiation Treatment Dates: 03/08/2019 through 03/18/2019 Site Technique Total Dose (Gy) Dose per Fx (Gy) Completed Fx Beam Energies  Pancreas: Pancreas IMRT 33/33 6.6 5/5 6XFFF    Narrative:  The patient returns today for routine follow-up. Since her last visit, she has been seen by Dr. Lorenso Courier on two separate occasions, 06/07/2019 and 07/19/2019. They will continue supportive care and symptom management.  The patient underwent a CT of chest/abdomen/pelvis on 08/03/2019 for ongoing upper abdominal pain, nausea, weight loss, and diarrhea. Results showed a slight decrease in size of the pancreatic head mass. There was noted to be heterogeneous arterial phase enhancement throughout the liver, favored to be related to transient hepatic attenuation differences; the more nodular arterial phase hyper-enhancement within the right hepatic lobe on the prior exam was not apparent on this exam. 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 There was no evidence of metastatic disease in the chest.  On review of systems, she reports increased upper abdominal pain but this is controlled well with Tylenol.  She does have tramadol available but does not use this medication.   She reports that she has a feeling of an empty stomach which triggers her discomfort.  Patient did test positive for COVID-19 but not did not have any significant symptoms.   She did undergo the [place] vaccine on February 13 and March 10.  ALLERGIES:  is allergic to levaquin [levofloxacin in d5w], choline fenofibrate, hctz [hydrochlorothiazide], simvastatin, adhesive [tape], bentyl [dicyclomine hcl], ceclor [cefaclor], clarithromycin, codeine, doxycycline, hydrocodone, lisinopril, penicillins, and tobramycin-dexamethasone.  Meds: Current Outpatient Medications  Medication Sig Dispense Refill  . acetaminophen (TYLENOL) 325 MG tablet Take 650 mg by mouth every 6 (six) hours as needed.    [name] albuterol (VENTOLIN HFA) 108 (90 Base) MCG/ACT inhaler Inhale 2 puffs into the lungs every 6 (six) hours as needed for wheezing or shortness of breath.     [name] amLODipine (NORVASC) 5 MG tablet Take 1 tablet (5 mg total) by mouth daily. 90 tablet 3  . aspirin EC 81 MG tablet Take 81 mg by mouth daily.    [name] CALCIUM PO Take 1 tablet by mouth daily with breakfast.     . diphenhydrAMINE (BENADRYL) 50 MG tablet Take 0.5 tablets (25 mg total) by mouth 2 (two) times daily. 30 tablet 0  . EPINEPHrine 0.3 mg/0.3 mL IJ SOAJ injection Inject 0.3 mg into the muscle as needed (as directed- FOR A SEVERE REACTION).     [name] fexofenadine (ALLEGRA) 180 MG tablet Take 180 mg by mouth every other day.     . fluticasone (FLONASE) 50 MCG/ACT nasal spray Place 1-2 sprays into both nostrils daily as needed for allergies or rhinitis.    . Glucosamine-Chondroit-Vit C-Mn (GLUCOSAMINE CHONDR 1500 COMPLX PO) Take 1 tablet by mouth 2 (two) times daily.     . hydrALAZINE (APRESOLINE) 50 MG tablet Take 0.5 tablets (25 mg total) by mouth 3 (three) times daily. (Patient  taking differently: Take 25 mg by mouth daily. ) 135 tablet 1  . ipratropium (ATROVENT) 0.03 % nasal spray     . latanoprost (XALATAN) 0.005 % ophthalmic solution Place 1 drop into both eyes at bedtime.     [name] losartan (COZAAR) 100 MG tablet TAKE 1/2 TABLET BY MOUTH THREE TIMES A DAY (Patient taking differently: 50 mg. TAKE 1/2 TABLET BY MOUTH QD) 90 tablet  0  . metoprolol tartrate (LOPRESSOR) 50 MG tablet Take 1 tablet (50 mg total) by mouth 3 (three) times daily. 270 tablet 3  . nitroGLYCERIN (NITROSTAT) 0.4 MG SL tablet Place 1 tablet (0.4 mg total) under the tongue every 5 (five) minutes as needed for chest pain. 25 tablet 2  . pantoprazole (PROTONIX) 40 MG tablet TAKE 1 TABLET BY MOUTH TWICE DAILY( 30 TO 60 MINUTES BEFORE BREAKFAST) 60 tablet 5  . Psyllium (METAMUCIL FIBER PO) Take 1 Scoop by mouth daily. Mix one rounded teaspoonful into 8 oz water and drink once a day    . rosuvastatin (CRESTOR) 5 MG tablet TAKE 1 TABLET BY MOUTH 3 TIMES A WEEK. (TAKE ON MONDAY, WEDNESDAY, AND FRIDAY) 36 tablet 3  . simethicone (MYLICON) 408 MG chewable tablet Chew 125 mg by mouth every 6 (six) hours as needed for flatulence.    . sodium chloride (OCEAN) 0.65 % SOLN nasal spray Place 1 spray into both nostrils daily as needed for congestion.     . sucralfate (CARAFATE) 1 g tablet TAKE 1 TABLET BY MOUTH BY MOUTH TWICE DAILY(ONE BEFORE DINNER) 60 tablet 1  . vitamin B-12 (CYANOCOBALAMIN) 1000 MCG tablet Take 1,000 mcg by mouth daily.      [name] VITAMIN D PO Take 200 mcg by mouth daily with breakfast. Vitamin A  300 mcg Calcium  40 mcg    . traMADol (ULTRAM) 50 MG tablet Take 1/2 to 1 tablet by mouth three times daily as needed (Patient not taking: Reported on 08/12/2019) 30 tablet 1   No current facility-administered medications for this encounter.    Physical Findings: The patient is in no acute distress. Patient is alert and oriented.  height is 4\' 10"  (1.473 m) and weight is 124 lb 2 oz (56.3 kg). Her temporal temperature is 97.4 F (36.3 C) (abnormal). Her blood pressure is 155/66 (abnormal) and her pulse is 58 (abnormal). Her respiration is 18 and oxygen saturation is 98%. .   Lungs are clear to auscultation bilaterally. Heart has regular rate and rhythm. No palpable cervical, supraclavicular, or axillary adenopathy. Abdomen soft, non-tender, normal bowel  sounds.  No rebound or guarding, no palpable epigastric mass.  Mild discomfort with palpation in the upper abdominal region   Lab Findings: Lab Results  Component Value Date   WBC 5.7 07/19/2019   HGB 11.1 (L) 07/19/2019   HCT 33.7 (L) 07/19/2019   MCV 92.1 07/19/2019   PLT 249 07/19/2019    Radiographic Findings: CT Chest W Contrast  Result Date: 08/03/2019 CLINICAL DATA:  Pancreatic adenocarcinoma. Evaluate treatment response. Diagnosed in January. Ongoing upper abdominal pain since diagnosis. Nausea. Weight loss. Diarrhea. History of breast cancer 8 years ago. EXAM: CT CHEST, ABDOMEN, AND PELVIS WITH CONTRAST TECHNIQUE: Multidetector CT imaging of the chest, abdomen and pelvis was performed following the standard protocol during bolus administration of intravenous contrast. CONTRAST:  151mL OMNIPAQUE IOHEXOL 300 MG/ML  SOLN COMPARISON:  05/06/2019 abdominopelvic CT. Most recent chest CT 11/29/2018. FINDINGS: CT CHEST FINDINGS Cardiovascular: Aortic atherosclerosis. Tortuous thoracic aorta. Normal heart size, without pericardial  effusion. Median sternotomy for CABG with native coronary artery atherosclerosis. No central pulmonary embolism, on this non-dedicated study. Mediastinum/Nodes: Small low left jugular nodes of maximally 4 mm are similar. No mediastinal or hilar adenopathy. Lungs/Pleura: No pleural fluid. Left lower lobe calcified granuloma. Musculoskeletal: No acute osseous abnormality. Presumably dystrophic calcifications within the left breast. CT ABDOMEN PELVIS FINDINGS Hepatobiliary: Heterogeneous arterial phase enhancement throughout the liver, including hyperenhancement within the posterior aspect of segment 4. Favored to be related to transient hepatic attenuation differences. The more nodular arterial phase hyperenhancement within the right hepatic lobe is not apparent today. No typical findings of hepatic metastasis. Focal steatosis adjacent the falciform ligament. No calcified  gallstones or acute cholecystitis. Pneumobilia.  Common duct stent appropriately position. Pancreas: Pancreatic atrophy and duct dilatation involving the body and neck. Pancreatic duct measures maximally 1.0 cm on 104/7 today versus 1.2 cm on the prior. Radiation fiducials within the pancreatic head. The pancreatic head mass is favored to be identified as an area of hypoenhancement including at 2.1 x 1.7 cm on 113/7. Felt to be slightly decreased compared to 2.3 x 2.3 cm on the prior exam. Peripancreatic fat planes are again ill-defined within the region of the head, most likely radiation induced. No upstream pancreatitis. Spleen: Subcentimeter low-density splenic lesions are similar and of doubtful clinical significance. Adrenals/Urinary Tract: Normal adrenal glands. Normal kidneys, without hydronephrosis. Normal urinary bladder. Stomach/Bowel: Proximal gastric underdistention. Extensive colonic diverticulosis. Normal terminal ileum. Descending and proximal transverse duodenal inflammation or similar and likely radiation induced. Otherwise normal small bowel. Vascular/Lymphatic: Aortic and branch vessel atherosclerosis. No arterial encasement. The superior mesenteric vein abuts the junction of the pancreatic duct dilatation and head lesion, including on 107/7. No acute thrombus. Portacaval node measures 8 mm on 97/7 versus 7 mm on the prior. No pelvic sidewall adenopathy. Reproductive: Normal uterus.  Prominent gonadal veins. Other: No significant free fluid. Moderate pelvic floor laxity. No abdominal ascites. No free intraperitoneal air. No evidence of omental or peritoneal disease. Musculoskeletal: Degenerative changes of both hips. Mild osteopenia. L4-5 grade 1 anterolisthesis. IMPRESSION: 1. Slight decrease in size of a pancreatic head mass. 2. No evidence of metastatic disease in the chest. 3. Heterogeneous arterial phase enhancement throughout the liver, favored to be related to transient hepatic attenuation  differences. The more nodular arterial phase hyperenhancement within the right hepatic lobe on the prior exam is not apparent today. 4. Suspect similar radiation induced pancreatitis and duodenitis. 5. Pelvic floor laxity. 6. Prominent gonadal veins, as can be seen with pelvic congestion syndrome. 7. Aortic Atherosclerosis (ICD10-I70.0). Electronically Signed   By: Abigail Miyamoto M.D.   On: 08/03/2019 13:35   CT Abdomen Pelvis W Contrast  Result Date: 08/03/2019 CLINICAL DATA:  Pancreatic adenocarcinoma. Evaluate treatment response. Diagnosed in January. Ongoing upper abdominal pain since diagnosis. Nausea. Weight loss. Diarrhea. History of breast cancer 8 years ago. EXAM: CT CHEST, ABDOMEN, AND PELVIS WITH CONTRAST TECHNIQUE: Multidetector CT imaging of the chest, abdomen and pelvis was performed following the standard protocol during bolus administration of intravenous contrast. CONTRAST:  154mL OMNIPAQUE IOHEXOL 300 MG/ML  SOLN COMPARISON:  05/06/2019 abdominopelvic CT. Most recent chest CT 11/29/2018. FINDINGS: CT CHEST FINDINGS Cardiovascular: Aortic atherosclerosis. Tortuous thoracic aorta. Normal heart size, without pericardial effusion. Median sternotomy for CABG with native coronary artery atherosclerosis. No central pulmonary embolism, on this non-dedicated study. Mediastinum/Nodes: Small low left jugular nodes of maximally 4 mm are similar. No mediastinal or hilar adenopathy. Lungs/Pleura: No pleural fluid. Left lower lobe calcified granuloma.  Musculoskeletal: No acute osseous abnormality. Presumably dystrophic calcifications within the left breast. CT ABDOMEN PELVIS FINDINGS Hepatobiliary: Heterogeneous arterial phase enhancement throughout the liver, including hyperenhancement within the posterior aspect of segment 4. Favored to be related to transient hepatic attenuation differences. The more nodular arterial phase hyperenhancement within the right hepatic lobe is not apparent today. No typical  findings of hepatic metastasis. Focal steatosis adjacent the falciform ligament. No calcified gallstones or acute cholecystitis. Pneumobilia.  Common duct stent appropriately position. Pancreas: Pancreatic atrophy and duct dilatation involving the body and neck. Pancreatic duct measures maximally 1.0 cm on 104/7 today versus 1.2 cm on the prior. Radiation fiducials within the pancreatic head. The pancreatic head mass is favored to be identified as an area of hypoenhancement including at 2.1 x 1.7 cm on 113/7. Felt to be slightly decreased compared to 2.3 x 2.3 cm on the prior exam. Peripancreatic fat planes are again ill-defined within the region of the head, most likely radiation induced. No upstream pancreatitis. Spleen: Subcentimeter low-density splenic lesions are similar and of doubtful clinical significance. Adrenals/Urinary Tract: Normal adrenal glands. Normal kidneys, without hydronephrosis. Normal urinary bladder. Stomach/Bowel: Proximal gastric underdistention. Extensive colonic diverticulosis. Normal terminal ileum. Descending and proximal transverse duodenal inflammation or similar and likely radiation induced. Otherwise normal small bowel. Vascular/Lymphatic: Aortic and branch vessel atherosclerosis. No arterial encasement. The superior mesenteric vein abuts the junction of the pancreatic duct dilatation and head lesion, including on 107/7. No acute thrombus. Portacaval node measures 8 mm on 97/7 versus 7 mm on the prior. No pelvic sidewall adenopathy. Reproductive: Normal uterus.  Prominent gonadal veins. Other: No significant free fluid. Moderate pelvic floor laxity. No abdominal ascites. No free intraperitoneal air. No evidence of omental or peritoneal disease. Musculoskeletal: Degenerative changes of both hips. Mild osteopenia. L4-5 grade 1 anterolisthesis. IMPRESSION: 1. Slight decrease in size of a pancreatic head mass. 2. No evidence of metastatic disease in the chest. 3. Heterogeneous arterial  phase enhancement throughout the liver, favored to be related to transient hepatic attenuation differences. The more nodular arterial phase hyperenhancement within the right hepatic lobe on the prior exam is not apparent today. 4. Suspect similar radiation induced pancreatitis and duodenitis. 5. Pelvic floor laxity. 6. Prominent gonadal veins, as can be seen with pelvic congestion syndrome. 7. Aortic Atherosclerosis (ICD10-I70.0). Electronically Signed   By: Abigail Miyamoto M.D.   On: 08/03/2019 13:35    Impression: Adenocarcinoma of the pancreatic head,uT2N1, s/p SBRT  Recent CT scan shows mild shrinkage of the pancreatic mass without any new areas of spread.  The patient is happy with these results.  Plan: Follow-up with radiation oncology in as needed basis.  The patient is scheduled to see Dr. Lorenso Courier on 09/01/2019, during which time they will discuss possible chemotherapy options.  Patient will also be scheduling a follow-up in gastroenterology concerning reflux issues and discussion of possible pancreatic enzymes    ____________________________________   Blair Promise, PhD, MD  This document serves as a record of services personally performed by Gery Pray, MD. It was created on his behalf by Clerance Lav, a trained medical scribe. The creation of this record is based on the scribe's personal observations and the provider's statements to them. This document has been checked and approved by the attending provider.

## 2019-08-12 ENCOUNTER — Other Ambulatory Visit: Payer: Self-pay

## 2019-08-12 ENCOUNTER — Ambulatory Visit
Admission: RE | Admit: 2019-08-12 | Discharge: 2019-08-12 | Disposition: A | Discharge status: Home / Self Care | Payer: Medicare Other | Source: Ambulatory Visit | Attending: Radiation Oncology | Admitting: Radiation Oncology

## 2019-08-12 ENCOUNTER — Encounter: Payer: Self-pay | Admitting: Radiation Oncology

## 2019-08-12 VITALS — BP 155/66 | HR 58 | Temp 97.4°F | Resp 18 | Ht <= 58 in | Wt 124.1 lb

## 2019-08-12 DIAGNOSIS — C250 Malignant neoplasm of head of pancreas: Secondary | ICD-10-CM | POA: Diagnosis not present

## 2019-08-12 DIAGNOSIS — Z923 Personal history of irradiation: Secondary | ICD-10-CM | POA: Insufficient documentation

## 2019-08-12 DIAGNOSIS — Z7982 Long term (current) use of aspirin: Secondary | ICD-10-CM | POA: Diagnosis not present

## 2019-08-12 DIAGNOSIS — R110 Nausea: Secondary | ICD-10-CM | POA: Diagnosis not present

## 2019-08-12 DIAGNOSIS — K5730 Diverticulosis of large intestine without perforation or abscess without bleeding: Secondary | ICD-10-CM | POA: Insufficient documentation

## 2019-08-12 DIAGNOSIS — Z79899 Other long term (current) drug therapy: Secondary | ICD-10-CM | POA: Insufficient documentation

## 2019-08-12 DIAGNOSIS — I700 Atherosclerosis of aorta: Secondary | ICD-10-CM | POA: Insufficient documentation

## 2019-08-12 DIAGNOSIS — Z8616 Personal history of COVID-19: Secondary | ICD-10-CM | POA: Diagnosis not present

## 2019-08-12 DIAGNOSIS — I2510 Atherosclerotic heart disease of native coronary artery without angina pectoris: Secondary | ICD-10-CM | POA: Diagnosis not present

## 2019-08-12 NOTE — Progress Notes (Signed)
Patient in for follow up doing well. She is having some increased pain and is curious as to why. No other issues at this time. BP (!) 155/66 (BP Location: Right Arm, Patient Position: Sitting)   Pulse (!) 58   Temp (!) 97.4 F (36.3 C) (Temporal)   Resp 18   Ht 4\' 10"  (1.473 m)   Wt 124 lb 2 oz (56.3 kg)   LMP 11/26/1990   SpO2 98%   BMI 25.94 kg/m  Filed Weights   08/12/19 1040  Weight: 124 lb 2 oz (56.3 kg)

## 2019-08-12 NOTE — Patient Instructions (Signed)
Coronavirus (COVID-19) Are you at risk?  Are you at risk for the Coronavirus (COVID-19)?  To be considered HIGH RISK for Coronavirus (COVID-19), you have to meet the following criteria:  . Traveled to [place], [place], [place], [place] or [place]; or in the [place] to [place], [place], [place], or [place]; and have fever, cough, and shortness of breath within the last 2 weeks of travel OR . Been in close contact with a person diagnosed with COVID-19 within the last 2 weeks and have fever, cough, and shortness of breath . IF YOU DO NOT MEET THESE CRITERIA, YOU ARE CONSIDERED LOW RISK FOR COVID-19.  What to do if you are HIGH RISK for COVID-19?  . If you are having a medical emergency, call 911. . Seek medical care right away. Before you go to a doctor's office, urgent care or emergency department, call ahead and tell them about your recent travel, contact with someone diagnosed with COVID-19, and your symptoms. You should receive instructions from your physician's office regarding next steps of care.  . When you arrive at healthcare provider, tell the healthcare staff immediately you have returned from visiting [place], [place], [place], [place] or [place]; or traveled in the [place] to [place], [place], [place], or [place]; in the last two weeks or you have been in close contact with a person diagnosed with COVID-19 in the last 2 weeks.   . Tell the health care staff about your symptoms: fever, cough and shortness of breath. . After you have been seen by a medical provider, you will be either: o Tested for (COVID-19) and discharged home on quarantine except to seek medical care if symptoms worsen, and asked to  - Stay home and avoid contact with others until you get your results (4-5 days)  - Avoid travel on public transportation if possible (such as bus, train, or airplane) or o Sent to the Emergency Department by EMS for evaluation, COVID-19 testing, and possible  admission depending on your condition and test results.  What to do if you are LOW RISK for COVID-19?  Reduce your risk of any infection by using the same precautions used for avoiding the common cold or flu:  . Wash your hands often with soap and warm water for at least 20 seconds.  If soap and water are not readily available, use an alcohol-based hand sanitizer with at least 60% alcohol.  . If coughing or sneezing, cover your mouth and nose by coughing or sneezing into the elbow areas of your shirt or coat, into a tissue or into your sleeve (not your hands). . Avoid shaking hands with others and consider head nods or verbal greetings only. . Avoid touching your eyes, nose, or mouth with unwashed hands.  . Avoid close contact with people who are sick. . Avoid places or events with large numbers of people in one location, like concerts or sporting events. . Carefully consider travel plans you have or are making. . If you are planning any travel outside or inside the [place], visit the CDC's Travelers' Health webpage for the latest health notices. . If you have some symptoms but not all symptoms, continue to monitor at home and seek medical attention if your symptoms worsen. . If you are having a medical emergency, call 911.   ADDITIONAL HEALTHCARE OPTIONS FOR PATIENTS   Telehealth / e-Visit: [email or web address]         [facility]: [phone]  [facility]: [phone]                   [facility]: [phone]   

## 2019-08-20 ENCOUNTER — Telehealth: Payer: Self-pay

## 2019-08-20 NOTE — Telephone Encounter (Signed)
Patient called asking if it was okay to have an xray after radiation. She reports having some pain that is worsening from an old injury. She states the pain goes from her neck to her lower back and the pain is mostly between her shoulder blades. Her chiropractor told her to call Dr. Sondra Come to see if it was okay. Patient advised will ask him and will be back in touch as he is out of town until 6/28.

## 2019-08-23 ENCOUNTER — Inpatient Hospital Stay: Payer: Medicare Other | Attending: Hematology and Oncology

## 2019-08-23 ENCOUNTER — Other Ambulatory Visit: Payer: Self-pay | Admitting: *Deleted

## 2019-08-23 ENCOUNTER — Other Ambulatory Visit: Payer: Self-pay

## 2019-08-23 ENCOUNTER — Telehealth: Payer: Self-pay | Admitting: *Deleted

## 2019-08-23 DIAGNOSIS — R1013 Epigastric pain: Secondary | ICD-10-CM | POA: Diagnosis not present

## 2019-08-23 DIAGNOSIS — T8579XA Infection and inflammatory reaction due to other internal prosthetic devices, implants and grafts, initial encounter: Secondary | ICD-10-CM | POA: Diagnosis not present

## 2019-08-23 DIAGNOSIS — C250 Malignant neoplasm of head of pancreas: Secondary | ICD-10-CM

## 2019-08-23 DIAGNOSIS — Z03818 Encounter for observation for suspected exposure to other biological agents ruled out: Secondary | ICD-10-CM | POA: Diagnosis not present

## 2019-08-23 DIAGNOSIS — R945 Abnormal results of liver function studies: Secondary | ICD-10-CM | POA: Diagnosis not present

## 2019-08-23 LAB — CBC WITH DIFFERENTIAL (CANCER CENTER ONLY)
Abs Immature Granulocytes: 0.02 10*3/uL (ref 0.00–0.07)
Basophils Absolute: 0.1 10*3/uL (ref 0.0–0.1)
Basophils Relative: 1 %
Eosinophils Absolute: 0.2 10*3/uL (ref 0.0–0.5)
Eosinophils Relative: 3 %
HCT: 32.5 % — ABNORMAL LOW (ref 36.0–46.0)
Hemoglobin: 10.8 g/dL — ABNORMAL LOW (ref 12.0–15.0)
Immature Granulocytes: 0 %
Lymphocytes Relative: 27 %
Lymphs Abs: 1.9 10*3/uL (ref 0.7–4.0)
MCH: 30.7 pg (ref 26.0–34.0)
MCHC: 33.2 g/dL (ref 30.0–36.0)
MCV: 92.3 fL (ref 80.0–100.0)
Monocytes Absolute: 0.8 10*3/uL (ref 0.1–1.0)
Monocytes Relative: 11 %
Neutro Abs: 3.9 10*3/uL (ref 1.7–7.7)
Neutrophils Relative %: 58 %
Platelet Count: 274 10*3/uL (ref 150–400)
RBC: 3.52 MIL/uL — ABNORMAL LOW (ref 3.87–5.11)
RDW: 13.8 % (ref 11.5–15.5)
WBC Count: 6.9 10*3/uL (ref 4.0–10.5)
nRBC: 0 % (ref 0.0–0.2)

## 2019-08-23 LAB — CMP (CANCER CENTER ONLY)
ALT: 24 U/L (ref 0–44)
AST: 20 U/L (ref 15–41)
Albumin: 3.8 g/dL (ref 3.5–5.0)
Alkaline Phosphatase: 108 U/L (ref 38–126)
Anion gap: 9 (ref 5–15)
BUN: 13 mg/dL (ref 8–23)
CO2: 28 mmol/L (ref 22–32)
Calcium: 9.4 mg/dL (ref 8.9–10.3)
Chloride: 97 mmol/L — ABNORMAL LOW (ref 98–111)
Creatinine: 0.82 mg/dL (ref 0.44–1.00)
GFR, Est AFR Am: >60 mL/min (ref >60)
GFR, Estimated: >60 mL/min (ref >60)
Glucose, Bld: 123 mg/dL — ABNORMAL HIGH (ref 70–99)
Potassium: 4.6 mmol/L (ref 3.5–5.1)
Sodium: 134 mmol/L — ABNORMAL LOW (ref 135–145)
Total Bilirubin: 0.8 mg/dL (ref 0.3–1.2)
Total Protein: 7.4 g/dL (ref 6.5–8.1)

## 2019-08-23 LAB — SAMPLE TO BLOOD BANK

## 2019-08-23 NOTE — Telephone Encounter (Signed)
Pt here for labs and review of results. Dr. Lorenso Courier aware of results. Nothing remarkable noted. Very slight decrease in HGB tom 10.8 from 11.1  Spoke with patient and her daughter. Advised that labs were fine.  Copy given to her to share with Dr. Fuller Plan at her appt tomorrow. Advised that we would re-check next week when she sees Dr. Lorenso Courier on 09/01/19.  [name] and her daughter voiced understanding.

## 2019-08-23 NOTE — Telephone Encounter (Signed)
Received call from patient stating that she is experiencing worsening fatigue, no energy. She states she doesn't have the energy to do things that she had been doing recently like going to church.  She also states she is seeing blood in the toilet after a bowel movement. Denies dark stool, jaundice or change in appetite. Denies nausea/vomiting/diarrhea./fever/chills.  She sees Dr. Lynne Leader NP tomorrow. Discussed with Dr. Lorenso Courier. He would like her to get labs checked today. Advised pt to come in today for labs and wait for CBC results. She states she can come in @ 11 am.and states she will wait for this RN to advise her of CBC results.

## 2019-08-24 ENCOUNTER — Emergency Department (HOSPITAL_COMMUNITY): Payer: Medicare Other

## 2019-08-24 ENCOUNTER — Other Ambulatory Visit: Payer: Self-pay

## 2019-08-24 ENCOUNTER — Inpatient Hospital Stay (HOSPITAL_COMMUNITY)
Admission: EM | Admit: 2019-08-24 | Discharge: 2019-08-28 | DRG: 919 | Disposition: A | Discharge status: Home / Self Care | Payer: Medicare Other | Attending: Internal Medicine | Admitting: Internal Medicine

## 2019-08-24 ENCOUNTER — Encounter: Payer: Self-pay | Admitting: Gastroenterology

## 2019-08-24 ENCOUNTER — Ambulatory Visit (INDEPENDENT_AMBULATORY_CARE_PROVIDER_SITE_OTHER): Payer: Medicare Other | Admitting: Gastroenterology

## 2019-08-24 ENCOUNTER — Encounter (HOSPITAL_COMMUNITY): Payer: Self-pay

## 2019-08-24 ENCOUNTER — Telehealth: Payer: Self-pay

## 2019-08-24 ENCOUNTER — Ambulatory Visit: Payer: Medicare Other | Admitting: Neurology

## 2019-08-24 VITALS — BP 140/62 | HR 72 | Ht <= 58 in | Wt 122.0 lb

## 2019-08-24 DIAGNOSIS — Z7982 Long term (current) use of aspirin: Secondary | ICD-10-CM

## 2019-08-24 DIAGNOSIS — C50512 Malignant neoplasm of lower-outer quadrant of left female breast: Secondary | ICD-10-CM | POA: Diagnosis not present

## 2019-08-24 DIAGNOSIS — T8579XA Infection and inflammatory reaction due to other internal prosthetic devices, implants and grafts, initial encounter: Principal | ICD-10-CM | POA: Diagnosis present

## 2019-08-24 DIAGNOSIS — Z86010 Personal history of colonic polyps: Secondary | ICD-10-CM

## 2019-08-24 DIAGNOSIS — Z881 Allergy status to other antibiotic agents status: Secondary | ICD-10-CM

## 2019-08-24 DIAGNOSIS — H409 Unspecified glaucoma: Secondary | ICD-10-CM | POA: Diagnosis present

## 2019-08-24 DIAGNOSIS — R7401 Elevation of levels of liver transaminase levels: Secondary | ICD-10-CM

## 2019-08-24 DIAGNOSIS — I2510 Atherosclerotic heart disease of native coronary artery without angina pectoris: Secondary | ICD-10-CM | POA: Diagnosis present

## 2019-08-24 DIAGNOSIS — R1013 Epigastric pain: Secondary | ICD-10-CM

## 2019-08-24 DIAGNOSIS — G4733 Obstructive sleep apnea (adult) (pediatric): Secondary | ICD-10-CM | POA: Diagnosis not present

## 2019-08-24 DIAGNOSIS — Z803 Family history of malignant neoplasm of breast: Secondary | ICD-10-CM

## 2019-08-24 DIAGNOSIS — Z853 Personal history of malignant neoplasm of breast: Secondary | ICD-10-CM

## 2019-08-24 DIAGNOSIS — Z8507 Personal history of malignant neoplasm of pancreas: Secondary | ICD-10-CM | POA: Diagnosis not present

## 2019-08-24 DIAGNOSIS — J9601 Acute respiratory failure with hypoxia: Secondary | ICD-10-CM | POA: Diagnosis present

## 2019-08-24 DIAGNOSIS — I10 Essential (primary) hypertension: Secondary | ICD-10-CM | POA: Diagnosis present

## 2019-08-24 DIAGNOSIS — K922 Gastrointestinal hemorrhage, unspecified: Secondary | ICD-10-CM | POA: Insufficient documentation

## 2019-08-24 DIAGNOSIS — Z03818 Encounter for observation for suspected exposure to other biological agents ruled out: Secondary | ICD-10-CM | POA: Diagnosis not present

## 2019-08-24 DIAGNOSIS — Z170 Estrogen receptor positive status [ER+]: Secondary | ICD-10-CM | POA: Diagnosis not present

## 2019-08-24 DIAGNOSIS — A419 Sepsis, unspecified organism: Secondary | ICD-10-CM | POA: Diagnosis present

## 2019-08-24 DIAGNOSIS — D62 Acute posthemorrhagic anemia: Secondary | ICD-10-CM | POA: Diagnosis present

## 2019-08-24 DIAGNOSIS — K838 Other specified diseases of biliary tract: Secondary | ICD-10-CM | POA: Diagnosis not present

## 2019-08-24 DIAGNOSIS — Z9841 Cataract extraction status, right eye: Secondary | ICD-10-CM

## 2019-08-24 DIAGNOSIS — K831 Obstruction of bile duct: Secondary | ICD-10-CM | POA: Diagnosis present

## 2019-08-24 DIAGNOSIS — I25701 Atherosclerosis of coronary artery bypass graft(s), unspecified, with angina pectoris with documented spasm: Secondary | ICD-10-CM | POA: Diagnosis present

## 2019-08-24 DIAGNOSIS — Y732 Prosthetic and other implants, materials and accessory gastroenterology and urology devices associated with adverse incidents: Secondary | ICD-10-CM | POA: Diagnosis present

## 2019-08-24 DIAGNOSIS — Z951 Presence of aortocoronary bypass graft: Secondary | ICD-10-CM | POA: Diagnosis not present

## 2019-08-24 DIAGNOSIS — K920 Hematemesis: Secondary | ICD-10-CM | POA: Diagnosis present

## 2019-08-24 DIAGNOSIS — Z888 Allergy status to other drugs, medicaments and biological substances status: Secondary | ICD-10-CM

## 2019-08-24 DIAGNOSIS — C250 Malignant neoplasm of head of pancreas: Secondary | ICD-10-CM | POA: Diagnosis present

## 2019-08-24 DIAGNOSIS — Z79899 Other long term (current) drug therapy: Secondary | ICD-10-CM

## 2019-08-24 DIAGNOSIS — Z20822 Contact with and (suspected) exposure to covid-19: Secondary | ICD-10-CM | POA: Diagnosis present

## 2019-08-24 DIAGNOSIS — E7800 Pure hypercholesterolemia, unspecified: Secondary | ICD-10-CM | POA: Diagnosis present

## 2019-08-24 DIAGNOSIS — Z8249 Family history of ischemic heart disease and other diseases of the circulatory system: Secondary | ICD-10-CM

## 2019-08-24 DIAGNOSIS — R7989 Other specified abnormal findings of blood chemistry: Secondary | ICD-10-CM

## 2019-08-24 DIAGNOSIS — K921 Melena: Secondary | ICD-10-CM | POA: Diagnosis not present

## 2019-08-24 DIAGNOSIS — Z823 Family history of stroke: Secondary | ICD-10-CM

## 2019-08-24 DIAGNOSIS — C50519 Malignant neoplasm of lower-outer quadrant of unspecified female breast: Secondary | ICD-10-CM | POA: Diagnosis present

## 2019-08-24 DIAGNOSIS — J45909 Unspecified asthma, uncomplicated: Secondary | ICD-10-CM | POA: Diagnosis present

## 2019-08-24 DIAGNOSIS — R509 Fever, unspecified: Secondary | ICD-10-CM

## 2019-08-24 DIAGNOSIS — Z8042 Family history of malignant neoplasm of prostate: Secondary | ICD-10-CM

## 2019-08-24 DIAGNOSIS — K8309 Other cholangitis: Secondary | ICD-10-CM | POA: Diagnosis present

## 2019-08-24 DIAGNOSIS — J449 Chronic obstructive pulmonary disease, unspecified: Secondary | ICD-10-CM | POA: Diagnosis present

## 2019-08-24 DIAGNOSIS — Z9842 Cataract extraction status, left eye: Secondary | ICD-10-CM

## 2019-08-24 DIAGNOSIS — Z9989 Dependence on other enabling machines and devices: Secondary | ICD-10-CM | POA: Diagnosis not present

## 2019-08-24 DIAGNOSIS — D630 Anemia in neoplastic disease: Secondary | ICD-10-CM | POA: Diagnosis present

## 2019-08-24 DIAGNOSIS — Z880 Allergy status to penicillin: Secondary | ICD-10-CM

## 2019-08-24 DIAGNOSIS — R945 Abnormal results of liver function studies: Secondary | ICD-10-CM | POA: Diagnosis not present

## 2019-08-24 DIAGNOSIS — Z923 Personal history of irradiation: Secondary | ICD-10-CM

## 2019-08-24 DIAGNOSIS — C259 Malignant neoplasm of pancreas, unspecified: Secondary | ICD-10-CM

## 2019-08-24 DIAGNOSIS — G893 Neoplasm related pain (acute) (chronic): Secondary | ICD-10-CM | POA: Diagnosis present

## 2019-08-24 DIAGNOSIS — D509 Iron deficiency anemia, unspecified: Secondary | ICD-10-CM | POA: Diagnosis present

## 2019-08-24 DIAGNOSIS — K219 Gastro-esophageal reflux disease without esophagitis: Secondary | ICD-10-CM | POA: Diagnosis present

## 2019-08-24 DIAGNOSIS — E119 Type 2 diabetes mellitus without complications: Secondary | ICD-10-CM | POA: Diagnosis present

## 2019-08-24 DIAGNOSIS — Z833 Family history of diabetes mellitus: Secondary | ICD-10-CM

## 2019-08-24 DIAGNOSIS — J4520 Mild intermittent asthma, uncomplicated: Secondary | ICD-10-CM | POA: Diagnosis not present

## 2019-08-24 DIAGNOSIS — M797 Fibromyalgia: Secondary | ICD-10-CM | POA: Diagnosis present

## 2019-08-24 HISTORY — DX: Sleep apnea, unspecified: G47.30

## 2019-08-24 LAB — LIPASE, BLOOD: Lipase: 14 U/L (ref 11–51)

## 2019-08-24 LAB — SARS CORONAVIRUS 2 BY RT PCR (HOSPITAL ORDER, PERFORMED IN ~~LOC~~ HOSPITAL LAB): SARS Coronavirus 2: NEGATIVE

## 2019-08-24 LAB — COMPREHENSIVE METABOLIC PANEL
ALT: 273 U/L — ABNORMAL HIGH (ref 0–44)
AST: 552 U/L — ABNORMAL HIGH (ref 15–41)
Albumin: 4.1 g/dL (ref 3.5–5.0)
Alkaline Phosphatase: 154 U/L — ABNORMAL HIGH (ref 38–126)
Anion gap: 11 (ref 5–15)
BUN: 11 mg/dL (ref 8–23)
CO2: 28 mmol/L (ref 22–32)
Calcium: 9 mg/dL (ref 8.9–10.3)
Chloride: 96 mmol/L — ABNORMAL LOW (ref 98–111)
Creatinine, Ser: 0.58 mg/dL (ref 0.44–1.00)
GFR calc Af Amer: >60 mL/min (ref >60)
GFR calc non Af Amer: >60 mL/min (ref >60)
Glucose, Bld: 115 mg/dL — ABNORMAL HIGH (ref 70–99)
Potassium: 4.2 mmol/L (ref 3.5–5.1)
Sodium: 135 mmol/L (ref 135–145)
Total Bilirubin: 1.3 mg/dL — ABNORMAL HIGH (ref 0.3–1.2)
Total Protein: 7.5 g/dL (ref 6.5–8.1)

## 2019-08-24 LAB — HEPATITIS PANEL, ACUTE
HCV Ab: NONREACTIVE
Hep A IgM: NONREACTIVE
Hep B C IgM: NONREACTIVE
Hepatitis B Surface Ag: NONREACTIVE

## 2019-08-24 LAB — URINALYSIS, ROUTINE W REFLEX MICROSCOPIC
Bilirubin Urine: NEGATIVE
Glucose, UA: NEGATIVE mg/dL
Hgb urine dipstick: NEGATIVE
Ketones, ur: NEGATIVE mg/dL
Nitrite: NEGATIVE
Protein, ur: NEGATIVE mg/dL
Specific Gravity, Urine: 1.005 (ref 1.005–1.030)
pH: 7 (ref 5.0–8.0)

## 2019-08-24 LAB — CBC
HCT: 32.1 % — ABNORMAL LOW (ref 36.0–46.0)
Hemoglobin: 10.7 g/dL — ABNORMAL LOW (ref 12.0–15.0)
MCH: 30.7 pg (ref 26.0–34.0)
MCHC: 33.3 g/dL (ref 30.0–36.0)
MCV: 92 fL (ref 80.0–100.0)
Platelets: 272 10*3/uL (ref 150–400)
RBC: 3.49 MIL/uL — ABNORMAL LOW (ref 3.87–5.11)
RDW: 13.9 % (ref 11.5–15.5)
WBC: 7.5 10*3/uL (ref 4.0–10.5)
nRBC: 0 % (ref 0.0–0.2)

## 2019-08-24 LAB — ACETAMINOPHEN LEVEL: Acetaminophen (Tylenol), Serum: <10 ug/mL — ABNORMAL LOW (ref 10–30)

## 2019-08-24 LAB — HEMOGLOBIN AND HEMATOCRIT, BLOOD
HCT: 29.5 % — ABNORMAL LOW (ref 36.0–46.0)
Hemoglobin: 9.7 g/dL — ABNORMAL LOW (ref 12.0–15.0)

## 2019-08-24 MED ORDER — ONDANSETRON HCL 4 MG/2ML IJ SOLN
4.0000 mg | Freq: Once | INTRAMUSCULAR | Status: AC
  Administered 2019-08-24: 4 mg via INTRAVENOUS
  Filled 2019-08-24: qty 2

## 2019-08-24 MED ORDER — SODIUM CHLORIDE (PF) 0.9 % IJ SOLN
INTRAMUSCULAR | Status: AC
  Filled 2019-08-24: qty 50

## 2019-08-24 MED ORDER — VITAMIN B-12 1000 MCG PO TABS
1000.0000 ug | ORAL_TABLET | Freq: Every day | ORAL | Status: DC
  Administered 2019-08-24 – 2019-08-28 (×4): 1000 ug via ORAL
  Filled 2019-08-24 (×4): qty 1

## 2019-08-24 MED ORDER — MORPHINE SULFATE (PF) 2 MG/ML IV SOLN
2.0000 mg | INTRAVENOUS | Status: DC | PRN
  Administered 2019-08-25: 2 mg via INTRAVENOUS
  Filled 2019-08-24: qty 1

## 2019-08-24 MED ORDER — FAMOTIDINE IN NACL 20-0.9 MG/50ML-% IV SOLN
20.0000 mg | Freq: Once | INTRAVENOUS | Status: AC
  Administered 2019-08-24: 20 mg via INTRAVENOUS
  Filled 2019-08-24: qty 50

## 2019-08-24 MED ORDER — ALBUTEROL SULFATE (2.5 MG/3ML) 0.083% IN NEBU: 2.5000 mg | INHALATION_SOLUTION | RESPIRATORY_TRACT | Status: DC | PRN

## 2019-08-24 MED ORDER — METOPROLOL TARTRATE 5 MG/5ML IV SOLN
5.0000 mg | Freq: Four times a day (QID) | INTRAVENOUS | Status: DC | PRN
  Administered 2019-08-26 (×2): 5 mg via INTRAVENOUS
  Filled 2019-08-24 (×2): qty 5

## 2019-08-24 MED ORDER — MORPHINE SULFATE (PF) 4 MG/ML IV SOLN
4.0000 mg | Freq: Once | INTRAVENOUS | Status: AC
  Administered 2019-08-24: 4 mg via INTRAVENOUS
  Filled 2019-08-24: qty 1

## 2019-08-24 MED ORDER — TRAMADOL HCL 50 MG PO TABS
50.0000 mg | ORAL_TABLET | Freq: Three times a day (TID) | ORAL | Status: DC | PRN
  Administered 2019-08-27 (×2): 50 mg via ORAL
  Filled 2019-08-24 (×2): qty 1

## 2019-08-24 MED ORDER — ONDANSETRON HCL 4 MG PO TABS: 4.0000 mg | ORAL_TABLET | Freq: Four times a day (QID) | ORAL | Status: DC | PRN

## 2019-08-24 MED ORDER — IOHEXOL 300 MG/ML  SOLN
100.0000 mL | Freq: Once | INTRAMUSCULAR | Status: AC | PRN
  Administered 2019-08-24: 100 mL via INTRAVENOUS

## 2019-08-24 MED ORDER — SODIUM CHLORIDE 0.9 % IV BOLUS
1000.0000 mL | Freq: Once | INTRAVENOUS | Status: AC
  Administered 2019-08-24: 1000 mL via INTRAVENOUS

## 2019-08-24 MED ORDER — HYDRALAZINE HCL 25 MG PO TABS
25.0000 mg | ORAL_TABLET | Freq: Three times a day (TID) | ORAL | Status: DC
  Administered 2019-08-24: 25 mg via ORAL
  Filled 2019-08-24: qty 1

## 2019-08-24 MED ORDER — SODIUM CHLORIDE 0.45 % IV SOLN: INTRAVENOUS | Status: DC

## 2019-08-24 MED ORDER — ONDANSETRON HCL 4 MG/2ML IJ SOLN
4.0000 mg | Freq: Four times a day (QID) | INTRAMUSCULAR | Status: DC | PRN
  Administered 2019-08-25: 4 mg via INTRAVENOUS
  Filled 2019-08-24: qty 2

## 2019-08-24 MED ORDER — AMLODIPINE BESYLATE 5 MG PO TABS
5.0000 mg | ORAL_TABLET | Freq: Every evening | ORAL | Status: DC
  Administered 2019-08-24: 5 mg via ORAL
  Filled 2019-08-24: qty 1

## 2019-08-24 MED ORDER — PANTOPRAZOLE SODIUM 40 MG PO TBEC
40.0000 mg | DELAYED_RELEASE_TABLET | Freq: Two times a day (BID) | ORAL | Status: DC
  Administered 2019-08-24: 40 mg via ORAL
  Filled 2019-08-24: qty 1

## 2019-08-24 MED ORDER — LATANOPROST 0.005 % OP SOLN
1.0000 [drp] | Freq: Every day | OPHTHALMIC | Status: DC
  Administered 2019-08-24 – 2019-08-27 (×4): 1 [drp] via OPHTHALMIC
  Filled 2019-08-24: qty 2.5

## 2019-08-24 MED ORDER — SODIUM CHLORIDE 0.9% FLUSH
3.0000 mL | Freq: Once | INTRAVENOUS | Status: AC
  Administered 2019-08-24: 3 mL via INTRAVENOUS

## 2019-08-24 MED ORDER — PIPERACILLIN-TAZOBACTAM 3.375 G IVPB
3.3750 g | Freq: Three times a day (TID) | INTRAVENOUS | Status: DC
  Administered 2019-08-25 – 2019-08-28 (×10): 3.375 g via INTRAVENOUS
  Filled 2019-08-24 (×9): qty 50

## 2019-08-24 MED ORDER — PIPERACILLIN-TAZOBACTAM 3.375 G IVPB 30 MIN
3.3750 g | Freq: Once | INTRAVENOUS | Status: AC
  Administered 2019-08-27: 3.375 g via INTRAVENOUS
  Filled 2019-08-24: qty 50

## 2019-08-24 MED ORDER — METOPROLOL TARTRATE 25 MG PO TABS
50.0000 mg | ORAL_TABLET | Freq: Two times a day (BID) | ORAL | Status: DC
  Administered 2019-08-24: 50 mg via ORAL
  Filled 2019-08-24: qty 1

## 2019-08-24 MED ORDER — PIPERACILLIN-TAZOBACTAM 3.375 G IVPB: 3.3750 g | Freq: Three times a day (TID) | INTRAVENOUS | Status: DC

## 2019-08-24 MED ORDER — SUCRALFATE 1 G PO TABS
1.0000 g | ORAL_TABLET | Freq: Two times a day (BID) | ORAL | Status: DC
  Administered 2019-08-24: 1 g via ORAL
  Filled 2019-08-24: qty 1

## 2019-08-24 NOTE — Progress Notes (Addendum)
Patient ID: [name], female   DOB: [birth year], 84 y.o.   MRN: [id number]    Progress Note   Subjective  Patient sent to the ER from the office today with complaints of severe abdominal pain and onset of rectal bleeding today. Patient with history of pancreatic adenocarcinoma, diagnosed 12/2018.  She has an uncovered metal biliary stent in place for malignant stricture.  She is being followed by oncology and completed radiation 03/2019 .   Most recent CT 08/03/2019 showed slight decrease in size of pancreatic head mass, suspect radiation induced pancreatitis and duodenitis.  SMV abuts the junction of the pancreatic duct dilation and had lesion.  Labs-WBC 7.5, hemoglobin 10.7 stable, was 11.29-monthago T bili 1.3/alk phos 154/ALT 273/AST 552-normal yesterday Lipase wnl  CT of the abdomen and pelvis  today - biliary stent in place, infiltrating pancreatic mass in pancreatic head surrounds the stent., overall mass size unchanged. There is indistinctness of tissue planes around duodenum,-pancreatitis /duodenitis not excluded  Patient has been hemodynamically stable in the ER.  Currently comfortable after analgesic, and denying nausea.  She did have what sounds like a shaking chill earlier this afternoon, no fever.  No further bowel movements or bleeding.     Objective   Vital signs in last 24 hours: Temp:  [98.1 F (36.7 C)] 98.1 F (36.7 C) (06/29 1020) Pulse Rate:  [67-85] 84 (06/29 1255) Resp:  [11-22] 13 (06/29 1255) BP: (140-177)/(35-72) 177/35 (06/29 1255) SpO2:  [85 %-100 %] 98 % (06/29 1255) Weight:  [55.3 kg] 55.3 kg (06/29 1023)   General:  eld  white female in NAD, husband at bedside Heart:  Regular rate and rhythm; no murmurs Lungs: Respirations even and unlabored, lungs CTA bilaterally Abdomen:  Soft,tender across upper abdomen,nondistended. Normal bowel sounds. Extremities:  Without edema. Neurologic:  Alert and oriented,  grossly normal neurologically. Psych:   Cooperative. Normal mood and affect.  Intake/Output from previous day: No intake/output data recorded. Intake/Output this shift: No intake/output data recorded.  Lab Results: Recent Labs    08/23/19 1116 08/24/19 1148  WBC 6.9 7.5  HGB 10.8* 10.7*  HCT 32.5* 32.1*  PLT 274 272   BMET Recent Labs    08/23/19 1116 08/24/19 1148  NA 134* 135  K 4.6 4.2  CL 97* 96*  CO2 28 28  GLUCOSE 123* 115*  BUN 13 11  CREATININE 0.82 0.58  CALCIUM 9.4 9.0   LFT Recent Labs    08/24/19 1148  PROT 7.5  ALBUMIN 4.1  AST 552*  ALT 273*  ALKPHOS 154*  BILITOT 1.3*   PT/INR No results for input(s): LABPROT, INR in the last 72 hours.  Studies/Results: No results found.     Assessment / Plan:    #[age]year old white female with pancreatic adenocarcinoma who completed radiation February 2021.  She had biliary obstruction at diagnosis and currently has an uncovered metal stent. Presented today with acute onset of abdominal pain, severe this morning.  She had had one episode of rectal bleeding over the weekend, and had another episode of dark red blood per rectum today.  Work-up in the ER with labs and CT, most consistent with stent occlusion.  She may have had bleeding secondary to hemobilia, or inflammatory process in the duodenum, contributing to impending biliary obstruction, no evidence by CT but also consider erosion of tumor into the duodenum.  #2 mild anemia stable  Plan; clear liquids this evening, n.p.o. in a.m. Pharmacy has consulted for antibiotics due  to multiple allergies, placed on Zosyn Repeat labs in a.m. to include INR Patient has been scheduled for EGD and ERCP with Dr. Lyndel Safe for 130 tomorrow afternoon.  Procedure was discussed in detail with the patient and her husband including indications risks and benefits and she is agreeable to proceed. Thank you we will follow with you       LOS: 0 days   Amy EsterwoodPA-C  08/24/2019, 2:04 PM    Attending  physician's note   I have taken a history, examined the patient and reviewed the chart. I agree with the Advanced Practitioner's note, impression and recommendations.  84 year old female with pancreatic head adenocarcinoma, non operable s/p uncovered metal biliary stent placement for malignant stricture November 2020 admitted with worsening abdominal pain and hematochezia  CT abdomen pelvis showed infiltrative pancreatic head lesion surrounding the stent and eroding into the duodenum  Elevated LFTs  Plan for EGD and ERCP with Dr. Lyndel Safe tomorrow for evaluation and to debride the biliary stent if needed with additional stent placement  IV Zosyn  N.p.o. after 5 AM   K. Denzil Magnuson , MD [phone]

## 2019-08-24 NOTE — ED Triage Notes (Signed)
Pt presents with c/o abdominal pain. Pt reports she has pancreatic cancer, hx of radiation, last one in January. Pt reports her MD diagnosed her with pancreatitis today and wanted her to be admitted. Pt also reports blood in her stool.

## 2019-08-24 NOTE — Progress Notes (Signed)
08/24/2019 [name] [id number] 1935-08-04   HISTORY OF PRESENT ILLNESS: This is an 84 year old female who is a patient of Dr. Lynne Leader.  She followed here in the past for issues with reflux, swallowing, etc.  She was then diagnosed with stage Ia pancreatic adenocarcinoma in October/November 2020.  January 06, 2019 she underwent ERCP/EUS with Dr. Rush Landmark at which time she had an uncovered metal biliary stent placed for a severe malignant biliary stricture.  Pus and sludge were removed from the bile duct at that time.  She has not returned to see [place] since then.  She has been under the care of Dr. Lorenso Courier with oncology and Dr. Sondra Come of radiation oncology.  CT scan of the abdomen and pelvis with contrast on June 8 showed the following:  IMPRESSION: 1. Slight decrease in size of a pancreatic head mass. 2. No evidence of metastatic disease in the chest. 3. Heterogeneous arterial phase enhancement throughout the liver, favored to be related to transient hepatic attenuation differences. The more nodular arterial phase hyperenhancement within the right hepatic lobe on the prior exam is not apparent today. 4. Suspect similar radiation induced pancreatitis and duodenitis. 5. Pelvic floor laxity. 6. Prominent gonadal veins, as can be seen with pelvic congestion syndrome. 7. Aortic Atherosclerosis (ICD10-I70.0).   She is here today with her daughter.  She initially was coming to this appointment for her regular issues with swallowing, etc., but then this morning she had sudden onset of severe epigastric abdominal pain, she says greater than 10 out of 10 on the pain scale.  They actually called EMS but when she told them that she had this visit scheduled they recommended that she come to this appointment for evaluation.  As of yesterday her LFTs were normal.  CBC showed hemoglobin was fairly stable at 10.8 g.  She tells me that she believes she saw some blood in her stool on Sunday and  then after EMS left this morning she had sudden onset of a large bloody bowel movement.  She is on pantoprazole 40 mg twice daily and Carafate tablet twice daily.  She tells me that she has just really been feeling poorly, like somebody pulled the plug and drain all of her energy.   Past Medical History:  Diagnosis Date  . Asthma   . Atrophic vaginitis   . Breast cancer, Left 12/20/2010   NO BLOOD PRESSURE CHECKS OR STICKS IN LEFT ARM  . CAD (coronary artery disease)    a. 01/19/2010 s/p CABG x 3, lima->lad, vg->diag, vg->om1;  b. 06/2011 :Lexi MV: EF 84%, No ischemia. c. 01/06/14 s/p negative nuclear stress test with EF >70%  . Cervical arthritis   . Colonic polyp 04-27-2009   tubular adenoma  . Diverticulosis of colon (without mention of hemorrhage)   . Fibromyalgia   . Gallstones   . GERD (gastroesophageal reflux disease)   . Glaucoma   . Headache(784.0)    a. frequently assocaited with high BPs  . Hiatal hernia   . Hypercholesterolemia   . Irritable bowel syndrome   . Labile hypertension   . Lymphedema    left arm  . Pancreatic cancer ([place]) dx'd 11/2018  . Pinched vertebral nerve   . Secondary diabetes ([place]) 12/03/2018   Past Surgical History:  Procedure Laterality Date  . [place] STUDY N/A 01/08/2016   Procedure: [place] STUDY;  Surgeon: Ladene Artist, MD;  Location: [facility] ENDOSCOPY;  Service: Endoscopy;  Laterality: N/A;  .  BILIARY BRUSHING  11/28/2018   Procedure: BILIARY BRUSHING;  Surgeon: Gatha Mayer, MD;  Location: [facility] END[facility]COPY;  Service: Endoscopy;;  . BILIARY DILATION  01/06/2019   Procedure: BILIARY DILATION;  Surgeon: Irving Copas., MD;  Location: [facility] ENDOSCOPY;  Service: Endoscopy;;  . BILIARY STENT PLACEMENT  11/28/2018   Procedure: BILIARY STENT PLACEMENT;  Surgeon: Gatha Mayer, MD;  Location: [facility] ENDOSCOPY;  Service: Endoscopy;;  . BILIARY STENT PLACEMENT N/A 01/06/2019   Procedure: BILIARY STENT PLACEMENT;  Surgeon: Irving Copas.,  MD;  Location: [facility] ENDOSCOPY;  Service: Endoscopy;  Laterality: N/A;  . BREAST LUMPECTOMY Left 02/06/11  . CATARACT EXTRACTION, BILATERAL     bilateral caaract removal,  . COLONOSCOPY    . CORONARY ANGIOPLASTY WITH STENT PLACEMENT     Stent 2007  . CORONARY ARTERY BYPASS GRAFT    . ENDOSCOPIC RETROGRADE CHOLANGIOPANCREATOGRAPHY (ERCP) WITH PROPOFOL N/A 01/06/2019   Procedure: ENDOSCOPIC RETROGRADE CHOLANGIOPANCREATOGRAPHY (ERCP) WITH PROPOFOL;  Surgeon: Rush Landmark Telford Nab., MD;  Location: [facility] ENDOSCOPY;  Service: Endoscopy;  Laterality: N/A;  [id number]  . ERCP N/A 11/28/2018   Procedure: ENDOSCOPIC RETROGRADE CHOLANGIOPANCREATOGRAPHY (ERCP);  Surgeon: Gatha Mayer, MD;  Location[facility] ENDOSCOPY;  Service: Endoscopy;  Laterality: N/A;  . ESOPHAGEAL MANOMETRY N/A 01/08/2016   Procedure: ESOPHAGEAL MANOMETRY (EM);  Surgeon: Ladene Artist, MD;  Location: [facility] ENDOSCOPY;  Service: Endoscopy;  Laterality: N/A;  . ESOPHAGOGASTRODUODENOSCOPY    . ESOPHAGOGASTRODUODENOSCOPY N/A 01/06/2019   Procedure: ESOPHAGOGASTRODUODENOSCOPY (EGD);  Surgeon: Rush Landmark Telford Nab., MD;  Location: [facility] ENDOS[facility];  Service: Endoscopy;  Laterality: N/A;  . ESOPHAGOGASTRODUODENOSCOPY (EGD) WITH PROPOFOL N/A 12/10/2018   Procedure: ESOPHAGOGASTRODUODENOSCOPY (EGD) WITH PROPOFOL;  Surgeon: [name], MD;  Location: [facility] ENDOSCOPY;  Service: Endoscopy;  Laterality: N/A;  . EUS N/A 12/10/2018   Procedure: UPPER ENDOSCOPIC ULTRASOUND (EUS) RADIAL;  Surgeon: [name], MD;  Location: [facility] ENDOSCOPY;  Service: Endoscopy;  Laterality: N/A;  . EUS N/A 01/06/2019   Procedure: FULL UPPER ENDOSCOPIC ULTRASOUND (EUS) RADIAL;  Surgeon: Rush Landmark Telford Nab., MD;  Location: [facility] ENDOSCOPY;  Service: Endoscopy;  Laterality: N/A;  . FIDUCIAL MARKER PLACEMENT  01/06/2019   Procedure: FIDUCIAL MARKER PLACEMENT;  Surgeon: Rush Landmark Telford Nab., MD;  Location: [facility] ENDOSCOPY;  Service: Endoscopy;;  . FINE NEEDLE ASPIRATION N/A  12/10/2018   Procedure: FINE NEEDLE ASPIRATION (FNA) LINEAR;  Surgeon: [name], MD;  Location: [facility] ENDOSCOPY;  Service: Endoscopy;  Laterality: N/A;  . SPHINCTEROTOMY  01/06/2019   Procedure: SPHINCTEROTOMY;  Surgeon: Rush Landmark Telford Nab., MD;  Location: [facility] ENDOSCOPY;  Service: Endoscopy;;  . STENT REMOVAL  01/06/2019   Procedure: STENT REMOVAL;  Surgeon: Irving Copas., MD;  Location: [facility] ENDOSCOPY;  Service: Endoscopy;;    reports that she has never smoked. She has never used smokeless tobacco. She reports that she does not drink alcohol and does not use drugs. family history includes Breast cancer in her sister; Cancer in her brother; Diabetes in her sister; Heart disease in her brother; Prostate cancer in her father; Stroke in her father and mother. Allergies  Allergen Reactions  . Levaquin [Levofloxacin In D5w]     Elevated BP  . Choline Fenofibrate Other (See Comments)     pt states INTOL to Trilipix w/ "thigh burning"  . Hctz [Hydrochlorothiazide]     Causes hyponatremia  . Simvastatin Other (See Comments)     pt states INTOL to STATINS \\T \ refuses to restart  . Adhesive [Tape] Rash  . Bentyl [Dicyclomine Hcl] Other (See  Comments)    "Made me feel weird and drained me"  . Ceclor [Cefaclor] Rash  . Clarithromycin Rash  . Codeine Nausea Only  . Doxycycline Rash  . Hydrocodone     Nightmare after taking cough syrup w/hydrocodone  . Lisinopril Cough    Developed ACE cough...  . Penicillins Itching, Rash and Other (See Comments)    At injection site Did it involve swelling of the face/tongue/throat, SOB, or low BP? No Did it involve sudden or severe rash/hives, skin peeling, or any reaction on the inside of your mouth or nose? No Did you need to seek medical attention at a hospital or doctor's office? No When did it last happen?"It was a long time ago" If all above answers are "NO", may proceed with cephalosporin use.   . Tobramycin-Dexamethasone Rash       Outpatient Encounter Medications as of 08/24/2019  Medication Sig  . acetaminophen (TYLENOL) 325 MG tablet Take 650 mg by mouth every 6 (six) hours as needed.  [name] albuterol (VENTOLIN HFA) 108 (90 Base) MCG/ACT inhaler Inhale 2 puffs into the lungs every 6 (six) hours as needed for wheezing or shortness of breath.   [name] amLODipine (NORVASC) 5 MG tablet Take 1 tablet (5 mg total) by mouth daily.  [name] aspirin EC 81 MG tablet Take 81 mg by mouth daily.  [name] CALCIUM PO Take 1 tablet by mouth daily with breakfast.   . diphenhydrAMINE (BENADRYL) 50 MG tablet Take 0.5 tablets (25 mg total) by mouth 2 (two) times daily.  [name] EPINEPHrine 0.3 mg/0.3 mL IJ SOAJ injection Inject 0.3 mg into the muscle as needed (as directed- FOR A SEVERE REACTION).   [name] fexofenadine (ALLEGRA) 180 MG tablet Take 180 mg by mouth every other day.   . fluticasone (FLONASE) 50 MCG/ACT nasal spray Place 1-2 sprays into both nostrils daily as needed for allergies or rhinitis.  . Glucosamine-Chondroit-Vit C-Mn (GLUCOSAMINE CHONDR 1500 COMPLX PO) Take 1 tablet by mouth 2 (two) times daily.   . hydrALAZINE (APRESOLINE) 50 MG tablet Take 0.5 tablets (25 mg total) by mouth 3 (three) times daily. (Patient taking differently: Take 25 mg by mouth daily. )  . ipratropium (ATROVENT) 0.03 % nasal spray   . latanoprost (XALATAN) 0.005 % ophthalmic solution Place 1 drop into both eyes at bedtime.   [name] losartan (COZAAR) 100 MG tablet TAKE 1/2 TABLET BY MOUTH THREE TIMES A DAY (Patient taking differently: 50 mg. TAKE 1/2 TABLET BY MOUTH QD)  . metoprolol tartrate (LOPRESSOR) 50 MG tablet Take 1 tablet (50 mg total) by mouth 3 (three) times daily.  . nitroGLYCERIN (NITROSTAT) 0.4 MG SL tablet Place 1 tablet (0.4 mg total) under the tongue every 5 (five) minutes as needed for chest pain.  . pantoprazole (PROTONIX) 40 MG tablet TAKE 1 TABLET BY MOUTH TWICE DAILY( 30 TO 60 MINUTES BEFORE BREAKFAST)  . Psyllium (METAMUCIL FIBER PO) Take 1 Scoop by mouth  daily. Mix one rounded teaspoonful into 8 oz water and drink once a day  . rosuvastatin (CRESTOR) 5 MG tablet TAKE 1 TABLET BY MOUTH 3 TIMES A WEEK. (TAKE ON MONDAY, [place], AND FRIDAY)  . simethicone (MYLICON) 283 MG chewable tablet Chew 125 mg by mouth every 6 (six) hours as needed for flatulence.  . sodium chloride (OCEAN) 0.65 % SOLN nasal spray Place 1 spray into both nostrils daily as needed for congestion.   . sucralfate (CARAFATE) 1 g tablet TAKE 1 TABLET BY MOUTH BY MOUTH TWICE DAILY(ONE BEFORE DINNER)  .  traMADol (ULTRAM) 50 MG tablet Take 1/2 to 1 tablet by mouth three times daily as needed  . vitamin B-12 (CYANOCOBALAMIN) 1000 MCG tablet Take 1,000 mcg by mouth daily.    [name] VITAMIN D PO Take 200 mcg by mouth daily with breakfast. Vitamin A  300 mcg Calcium  40 mcg   No facility-administered encounter medications on file as of 08/24/2019.     REVIEW OF SYSTEMS  : All other systems reviewed and negative except where noted in the History of Present Illness.   PHYSICAL EXAM: BP 140/62   Pulse 72   Ht 4\' 10"  (1.473 m)   Wt 122 lb (55.3 kg)   LMP 11/26/1990   BMI 25.50 kg/m  General: Well developed white female in no acute distress Head: Normocephalic and atraumatic Eyes:  Sclerae anicteric, conjunctiva pink. Ears: Normal auditory acuity Lungs: Clear throughout to auscultation; no increased WOB. Heart: Regular rate and rhythm; no M/R/G. Abdomen: Soft, non-distended.  BS present.  Moderate epigastric TTP. Rectal:  No external abnormalities noted.  DRE revealed dark bloody stool. Musculoskeletal: Symmetrical with no gross deformities  Skin: No lesions on visible extremities Extremities: No edema  Neurological: Alert oriented x 4, grossly non-focal Psychological:  Alert and cooperative. Normal mood and affect  ASSESSMENT AND PLAN: *84 year old female with pancreatic adenocarcinoma stage IA status post radiation.  Has malignant biliary stricture for which she had an  uncovered metal biliary stent placed last on January 06, 2019 (pus and sludge were removed from the biliary tree).  Now complaining of severe epigastric abdominal pain that really began this morning.  CT scan earlier this month suggested pancreatitis and duodenitis possibly secondary to radiation therapy.  LFTs are normal as of yesterday. *GI bleed: Had a large bloody bowel movement this morning and had dark bloody stool on rectal exam today.  This could possibly be from the duodenitis, possibly an ulcer, question other issues related to her malignancy, etc.  **I discussed with Dr. Fuller Plan.  We agree that due to her age and her medical conditions that she should proceed to [facility] long emergency department for evaluation and hospital admission where we can expedite her care.  She will need labs, IV hydration, likely repeat CT scan, and inpatient endoscopy.  Our hospital team was made aware.   CC:  Ronnald Nian, DO

## 2019-08-24 NOTE — ED Provider Notes (Signed)
[place] DEPT Provider Note   CSN: [id number] Arrival date & time: 08/24/19  1008     History Chief Complaint  Patient presents with   Abdominal Pain    [name] is a 84 y.o. female with past medical history significant for pancreatic cancer, s/p radiation, no longer on treatment who presents for evaluation of abdominal pain.  Patient with epigastric abdominal pain.  Has had pain times the last 2 weeks however acutely worsening over the last 24 hours.  Pain will occasionally radiate into her back.  Has had some nausea however no emesis.  Patient states she has had diarrhea.  Initially had bright red blood per rectum however she was seen by GI earlier today and they noted she had dark stool on occult.  They sent her here to the emergency department for further evaluation and likely admission.  No fever, chills, chest pain, shortness of breath, dysuria, rashes, lesions.  Denies additional aggravating or alleviating factors.  Rates her current pain a 6/10.  Patient states pain constant in nature however has intermittent worsening of pain.  Denies additional aggravating or alleviating factors.  No prior history of pancreatitis.  No chronic NSAID use, anticoagulation, alcohol use.  She states she does have generalized weakness however no dizziness, headache.  No syncope or near syncopal events.  History obtained from patient and past medical records.  No interpreter used.  HPI     Past Medical History:  Diagnosis Date   Asthma    Atrophic vaginitis    Breast cancer, Left 12/20/2010   NO BLOOD PRESSURE CHECKS OR STICKS IN LEFT ARM   CAD (coronary artery disease)    a. 01/19/2010 s/p CABG x 3, lima->lad, vg->diag, vg->om1;  b. 06/2011 :Lexi MV: EF 84%, No ischemia. c. 01/06/14 s/p negative nuclear stress test with EF >70%   Cervical arthritis    Colonic polyp 04-27-2009   tubular adenoma   Diverticulosis of colon (without mention of  hemorrhage)    Fibromyalgia    Gallstones    GERD (gastroesophageal reflux disease)    Glaucoma    Headache(784.0)    a. frequently assocaited with high BPs   Hiatal hernia    Hypercholesterolemia    Irritable bowel syndrome    Labile hypertension    Lymphedema    left arm   Pancreatic cancer ([place]) dx'd 11/2018   Pinched vertebral nerve    Secondary diabetes ([place]) 12/03/2018    Patient Active Problem List   Diagnosis Date Noted   Gastrointestinal hemorrhage 08/24/2019   Transaminitis 12/16/2018   Secondary diabetes ([place]) 12/03/2018   Adenocarcinoma of head of pancreas ([place]) 11/27/2018   Obstructive jaundice due to malignant neoplasm ([place]) 11/27/2018   Itching 11/26/2018   HTN (hypertension) 04/12/2018   Lumbar radiculopathy 04/12/2018   Degeneration of lumbar intervertebral disc 12/16/2017   Spinal stenosis of lumbar region 12/16/2017   Osteoarthritis of hip 09/25/2017   Low back pain 09/25/2017   S/P CABG (coronary artery bypass graft) 12/19/2016   Epigastric pain 04/11/2016   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   Heartburn 05/23/2015   OSA on CPAP 11/23/2014   Generalized anxiety disorder 02/04/2014   Labile hypertension    Fibromyalgia    Lumbar adjacent segment disease with spondylolisthesis 12/24/2013   Atherosclerosis of coronary artery bypass graft of native heart with angina pectoris with documented spasm ([place]) 12/24/2013   Bronchitis, chronic obstructive w acute bronchitis ([place]) 12/21/2013   Diverticulosis of colon without hemorrhage 11/15/2013  Hot flashes related to aromatase inhibitor therapy 07/27/2013   Osteopenia 07/27/2013   DJD (degenerative joint disease) 09/29/2012   Breast cancer, Left 12/20/2010   FATTY LIVER DISEASE 01/09/2010   Allergic rhinitis 02/18/2007   DIZZINESS, CHRONIC 02/18/2007   Headache in back of head 02/18/2007   Mixed hyperlipidemia 12/23/2006   GLAUCOMA 12/23/2006   Coronary  atherosclerosis 12/23/2006   Asthma 12/23/2006   Gastroesophageal reflux disease 12/23/2006   IRRITABLE BOWEL SYNDROME 12/23/2006   Claudication ([place]) 12/23/2006    Past Surgical History:  Procedure Laterality Date   24 HOUR [place] STUDY N/A 01/08/2016   Procedure: [place] STUDY;  Surgeon: Ladene Artist, MD;  Location: [facility] ENDOSCOPY;  Service: Endoscopy;  Laterality: N/A;   BILIARY BRUSHING  11/28/2018   Procedure: BILIARY BRUSHING;  Surgeon: Gatha Mayer, MD;  Location: [facility] ENDOSCOPY;  Service: Endoscopy;;   BILIARY DILATION  01/06/2019   Procedure: BILIARY DILATION;  Surgeon: Irving Copas., MD;  Location: [facility] ENDOS[facility];  Service: Endoscopy;;   BILIARY STENT PLACEMENT  11/28/2018   Procedure: BILIARY STENT PLACEMENT;  Surgeon: Gatha Mayer, MD;  Location: [facility];  Service: Endoscopy;;   BILIARY STENT PLACEMENT N/A 01/06/2019   Procedure: BILIARY STENT PLACEMENT;  Surgeon: Irving Copas., MD;  Location: [facility];  Service: Endoscopy;  Laterality: N/A;   BREAST LUMPECTOMY Left 02/06/11   CATARACT EXTRACTION, BILATERAL     bilateral caaract removal,   COLONOSCOPY     CORONARY ANGIOPLASTY WITH STENT PLACEMENT     Stent 2007   CORONARY ARTERY BYPASS GRAFT     ENDOSCOPIC RETROGRADE CHOLANGIOPANCREATOGRAPHY (ERCP) WITH PROPOFOL N/A 01/06/2019   Procedure: ENDOSCOPIC RETROGRADE CHOLANGIOPANCREATOGRAPHY (ERCP) WITH PROPOFOL;  Surgeon: Irving Copas., MD;  Location: [facility];  Service: Endoscopy;  Laterality: N/A;  [id number]   ERCP N/A 11/28/2018   Procedure: ENDOSCOPIC RETROGRADE CHOLANGIOPANCREATOGRAPHY (ERCP);  Surgeon: Gatha Mayer, MD;  Location: [facility] ENDOSCOPY;  Service: Endoscopy;  Laterality: N/A;   ESOPHAGEAL MANOMETRY N/A 01/08/2016   Procedure: ESOPHAGEAL MANOMETRY (EM);  Surgeon: Ladene Artist, MD;  Location: [facility];  Service: Endoscopy;  Laterality: N/A;   ESOPHAGOGASTRODUODENOSCOPY     ESOPHAGOGASTRODUODENOSCOPY  N/A 01/06/2019   Procedure: ESOPHAGOGASTRODUODENOSCOPY (EGD);  Surgeon: Rush Landmark Telford Nab., MD;  Location: [facility];  Service: Endoscopy;  Laterality: N/A;   ESOPHAGOGASTRODUODENOSCOPY (EGD) WITH PROPOFOL N/A 12/10/2018   Procedure: ESOPHAGOGASTRODUODENOSCOPY (EGD) WITH PROPOFOL;  Surgeon: [name], MD;  Location: [facility];  Service: Endoscopy;  Laterality: N/A;   EUS N/A 12/10/2018   Procedure: UPPER ENDOSCOPIC ULTRASOUND (EUS) RADIAL;  Surgeon: [name], MD;  Location: [facility];  Service: Endoscopy;  Laterality: N/A;   EUS N/A 01/06/2019   Procedure: FULL UPPER ENDOSCOPIC ULTRASOUND (EUS) RADIAL;  Surgeon: Rush Landmark Telford Nab., MD;  Location[facility];  Service: Endoscopy;  Laterality: N/A;   FIDUCIAL MARKER PLACEMENT  01/06/2019   Procedure: FIDUCIAL MARKER PLACEMENT;  Surgeon: Irving Copas., MD;  Location: [facility];  Service: Endoscopy;;   FINE NEEDLE ASPIRATION N/A 12/10/2018   Procedure: FINE NEEDLE ASPIRATION (FNA) LINEAR;  Surgeon: [name], MD;  Location[facility];  Service: Endoscopy;  Laterality: N/A;   SPHINCTEROTOMY  01/06/2019   Procedure: SPHINCTEROTOMY;  Surgeon: Mansouraty, Telford Nab., MD;  Location: [facility];  Service: Endoscopy;;   STENT REMOVAL  01/06/2019   Procedure: STENT REMOVAL;  Surgeon: Irving Copas., MD;  [facility];  Service: Endoscopy;;     OB History  Gravida  3   Para  2   Term  2   Preterm  0   AB  1   Living  2     SAB  0   TAB  0   Ectopic  0   Multiple  0   Live Births  2           Family History  Problem Relation Age of Onset   Stroke Mother    Stroke Father    Prostate cancer Father    Breast cancer Sister    Diabetes Sister    Cancer Brother        gland cancer   Heart disease Brother    Colon cancer Neg Hx    Stomach cancer Neg Hx    Pancreatic cancer Neg Hx    Kidney disease Neg Hx    Liver disease Neg  Hx     Social History   Tobacco Use   Smoking status: Never Smoker   Smokeless tobacco: Never Used  Vaping Use   Vaping Use: Never assessed  Substance Use Topics   Alcohol use: No    Alcohol/week: 0.0 standard drinks   Drug use: No    Home Medications Prior to Admission medications   Medication Sig Start Date End Date Taking? Authorizing Provider  acetaminophen (TYLENOL) 325 MG tablet Take 650 mg by mouth every 6 (six) hours as needed.   Yes [provider]  albuterol (VENTOLIN HFA) 108 (90 Base) MCG/ACT inhaler Inhale 2 puffs into the lungs every 6 (six) hours as needed for wheezing or shortness of breath.    Yes [provider]  amLODipine (NORVASC) 5 MG tablet Take 1 tablet (5 mg total) by mouth daily. Patient taking differently: Take 5 mg by mouth every evening.  12/18/18  Yes Josue Hector, MD  aspirin EC 81 MG tablet Take 81 mg by mouth daily.   Yes [provider]  CALCIUM PO Take 1 tablet by mouth daily with breakfast.    Yes [provider]  EPINEPHrine 0.3 mg/0.3 mL IJ SOAJ injection Inject 0.3 mg into the muscle as needed (as directed- FOR A SEVERE REACTION).  04/01/18  Yes [provider]  fexofenadine (ALLEGRA) 180 MG tablet Take 180 mg by mouth every other day.    Yes [provider]  fluticasone (FLONASE) 50 MCG/ACT nasal spray Place 1-2 sprays into both nostrils daily as needed for allergies or rhinitis.   Yes [provider]  Glucosamine-Chondroit-Vit C-Mn (GLUCOSAMINE CHONDR 1500 COMPLX PO) Take 1 tablet by mouth 2 (two) times daily.    Yes [provider]  hydrALAZINE (APRESOLINE) 50 MG tablet Take 0.5 tablets (25 mg total) by mouth 3 (three) times daily. Patient taking differently: Take 25 mg by mouth daily.  04/13/18  Yes Lucille Passy, MD  latanoprost (XALATAN) 0.005 % ophthalmic solution Place 1 drop into both eyes at bedtime.  07/31/12  Yes [provider]  losartan (COZAAR) 100  MG tablet TAKE 1/2 TABLET BY MOUTH THREE TIMES A DAY Patient taking differently: Take 50 mg by mouth daily.  04/29/19  Yes Cirigliano, Mary K, DO  metoprolol tartrate (LOPRESSOR) 50 MG tablet Take 1 tablet (50 mg total) by mouth 3 (three) times daily. 01/18/19  Yes Josue Hector, MD  nitroGLYCERIN (NITROSTAT) 0.4 MG SL tablet Place 1 tablet (0.4 mg total) under the tongue every 5 (five) minutes as needed for chest pain. 12/18/18  Yes Josue Hector,  MD  pantoprazole (PROTONIX) 40 MG tablet TAKE 1 TABLET BY MOUTH TWICE DAILY( 30 TO 60 MINUTES BEFORE BREAKFAST) Patient taking differently: Take 40 mg by mouth 2 (two) times daily.  07/02/19  Yes Ladene Artist, MD  Psyllium (METAMUCIL FIBER PO) Take 1 Scoop by mouth daily. Mix one rounded teaspoonful into 8 oz water and drink once a day   Yes [provider]  rosuvastatin (CRESTOR) 5 MG tablet TAKE 1 TABLET BY MOUTH 3 TIMES A WEEK. (TAKE ON MONDAY, WEDNESDAY, AND FRIDAY) Patient taking differently: Take 5 mg by mouth every Monday, Wednesday, and Friday.  05/13/19  Yes Cirigliano, Mary K, DO  simethicone (MYLICON) 732 MG chewable tablet Chew 125 mg by mouth every 6 (six) hours as needed for flatulence.   Yes [provider]  sodium chloride (OCEAN) 0.65 % SOLN nasal spray Place 1 spray into both nostrils daily as needed for congestion.    Yes [provider]  sucralfate (CARAFATE) 1 g tablet TAKE 1 TABLET BY MOUTH BY MOUTH TWICE DAILY(ONE BEFORE DINNER) Patient taking differently: Take 1 g by mouth 2 (two) times daily.  07/15/19  Yes Ladene Artist, MD  traMADol (ULTRAM) 50 MG tablet Take 1/2 to 1 tablet by mouth three times daily as needed Patient taking differently: Take 25-50 mg by mouth 3 (three) times daily as needed for moderate pain.  12/08/18  Yes Ladene Artist, MD  vitamin B-12 (CYANOCOBALAMIN) 1000 MCG tablet Take 1,000 mcg by mouth daily.     Yes [provider]  VITAMIN D PO Take 200 mcg by mouth daily  with breakfast. Vitamin A  300 mcg Calcium  40 mcg   Yes [provider]  diphenhydrAMINE (BENADRYL) 50 MG tablet Take 0.5 tablets (25 mg total) by mouth 2 (two) times daily. Patient not taking: Reported on 08/24/2019 01/04/19   Orson Slick, MD    Allergies    Levaquin [levofloxacin in d5w], Choline fenofibrate, Hctz [hydrochlorothiazide], Simvastatin, Adhesive [tape], Bentyl [dicyclomine hcl], Ceclor [cefaclor], Clarithromycin, Codeine, Doxycycline, Hydrocodone, Lisinopril, Penicillins, and Tobramycin-dexamethasone  Review of Systems   Review of Systems  Constitutional: Negative.   HENT: Negative.   Respiratory: Negative.   Cardiovascular: Negative.   Gastrointestinal: Positive for abdominal pain, blood in stool, diarrhea and nausea. Negative for abdominal distention, anal bleeding, constipation, rectal pain and vomiting.  Genitourinary: Negative.   Musculoskeletal: Negative.   Skin: Negative.   Neurological: Negative.   All other systems reviewed and are negative.   Physical Exam Updated Vital Signs BP (!) 154/70    Pulse 84    Temp 98.1 F (36.7 C) (Oral)    Resp (!) 22    Ht '4\' 10"'$  (1.473 m)    Wt 55.3 kg    LMP 11/26/1990    SpO2 96%    BMI 25.50 kg/m   Physical Exam Vitals and nursing note reviewed.  Constitutional:      General: She is not in acute distress.    Appearance: She is well-developed. She is not ill-appearing, toxic-appearing or diaphoretic.  HENT:     Head: Normocephalic and atraumatic.     Mouth/Throat:     Mouth: Mucous membranes are moist.  Eyes:     Pupils: Pupils are equal, round, and reactive to light.  Cardiovascular:     Rate and Rhythm: Normal rate.     Heart sounds: Normal heart sounds.  Pulmonary:     Effort: Pulmonary effort is normal. No respiratory distress.  Breath sounds: Normal breath sounds.     Comments: Speaks in full sentences without difficulty. Abdominal:     General: Bowel sounds are normal. There is no  distension.     Palpations: Abdomen is soft.     Tenderness: There is abdominal tenderness in the epigastric area. There is guarding. There is no right CVA tenderness, left CVA tenderness or rebound. Negative signs include Murphy's sign and McBurney's sign.     Hernia: No hernia is present.  Musculoskeletal:        General: Normal range of motion.     Cervical back: Normal range of motion.     Comments: No bony tenderness.  Compartments soft.  Bevelyn Buckles' sign negative.  Moves all 4 extremities without difficulty.  Skin:    General: Skin is warm and dry.     Capillary Refill: Capillary refill takes less than 2 seconds.     Comments: No edema, erythema or warmth.  No fluctuance or induration  Neurological:     General: No focal deficit present.     Mental Status: She is alert.     Cranial Nerves: Cranial nerves are intact.     Sensory: Sensation is intact.     Motor: Motor function is intact.     Coordination: Coordination is intact.     Gait: Gait is intact.     Comments: CN 2-12 grossly intact.  Ambulatory without difficulty    ED Results / Procedures / Treatments   Labs (all labs ordered are listed, but only abnormal results are displayed) Labs Reviewed  COMPREHENSIVE METABOLIC PANEL - Abnormal; Notable for the following components:      Result Value   Chloride 96 (*)    Glucose, Bld 115 (*)    AST 552 (*)    ALT 273 (*)    Alkaline Phosphatase 154 (*)    Total Bilirubin 1.3 (*)    All other components within normal limits  CBC - Abnormal; Notable for the following components:   RBC 3.49 (*)    Hemoglobin 10.7 (*)    HCT 32.1 (*)    All other components within normal limits  URINALYSIS, ROUTINE W REFLEX MICROSCOPIC - Abnormal; Notable for the following components:   Leukocytes,Ua TRACE (*)    Bacteria, UA RARE (*)    All other components within normal limits  ACETAMINOPHEN LEVEL - Abnormal; Notable for the following components:   Acetaminophen (Tylenol), Serum <10 (*)     All other components within normal limits  SARS CORONAVIRUS 2 BY RT PCR (HOSPITAL ORDER, [facility])  LIPASE, BLOOD  HEPATITIS PANEL, ACUTE  POC OCCULT BLOOD, ED    EKG EKG Interpretation  Date/Time:  Tuesday August 24 2019 11:45:12 EDT Ventricular Rate:  79 PR Interval:    QRS Duration: 83 QT Interval:  385 QTC Calculation: 442 R Axis:   18 Text Interpretation: Sinus rhythm Abnormal R-wave progression, early transition No significant change since prior 10/17 Confirmed by Aletta Edouard [phone]) on 08/24/2019 12:06:08 PM   Radiology CT ABDOMEN PELVIS W CONTRAST  Result Date: 08/24/2019 CLINICAL DATA:  Epigastric abdominal pain. Blood in stool. Pancreatic adenocarcinoma. Remote history of breast cancer. EXAM: CT ABDOMEN AND PELVIS WITH CONTRAST TECHNIQUE: Multidetector CT imaging of the abdomen and pelvis was performed using the standard protocol following bolus administration of intravenous contrast. CONTRAST:  13m OMNIPAQUE IOHEXOL 300 MG/ML  SOLN COMPARISON:  08/03/2019 FINDINGS: Lower chest: Stable scarring or subsegmental atelectasis in the lung bases.  Centrilobular emphysema. Old granulomatous disease. Descending thoracic aortic atherosclerotic calcification. Hepatobiliary: Pneumobilia. Biliary stent remains in place. No compelling findings of metastatic disease to the liver. No overt gallbladder wall thickening. Pancreas: Prominently dilated dorsal pancreatic duct similar to prior, extending to an infiltrative mass in the head of the pancreas which surrounds the stent. Several fiducials remain in place. Overall the pancreatic mass does not appear significantly changed, measuring roughly 3.1 by 2.8 cm on image 26/2. Mild surrounding inflammatory stranding along the pancreatic head. Spleen: Unchanged with 3 small hypodense lesions which are probably benign but technically nonspecific. Adrenals/Urinary Tract: Unremarkable Stomach/Bowel: Indistinctness of tissue  planes around the pancreatic head and adjacent portions of the duodenum, pancreatitis and duodenitis not exclude and some of this may be related to prior radiation therapy. Overall the degree of stranding around the descending and transverse duodenum is similar to 08/03/2019 and no dilation of the stomach or bowel is observed. Sigmoid colon diverticulosis is present. No active diverticulitis identified. Vascular/Lymphatic: The pancreatic head mass narrows the confluence of the splenic vein and the SMV into the portal vein. Aortoiliac atherosclerotic vascular disease. Stable prominence of parametrial venous structures, left greater than right, pelvic congestion syndrome not excluded. Reproductive: Unremarkable Other: Low-grade stranding in the central mesentery, minimally increased from prior, nonspecific. Musculoskeletal: Degenerative arthropathy of both hips. Grade 1 degenerative anterolisthesis at L4-5. Multilevel lumbar spondylosis and degenerative disc disease with left greater than right foraminal impingement at L5-S1. central narrowing of the thecal sac at the L4-5 level. IMPRESSION: 1. Similar appearance of the infiltrative mass in the head of the pancreas with surrounding fiducials. The mass narrows the confluence of the splenic vein and the SMV into the portal vein. 2. Indistinctness of tissue planes around the pancreatic head and adjacent portions of the duodenum, pancreatitis and duodenitis could cause this appearance, and radiation therapy in the past may be predisposing. 3. Other imaging findings of potential clinical significance: Sigmoid colon diverticulosis. Stable prominence of parametrial venous structures, left greater than right, pelvic congestion syndrome not excluded. Multilevel lumbar spondylosis and degenerative disc disease causing left greater than right foraminal impingement at L5-S1. 4. Emphysema and aortic atherosclerosis. Aortic Atherosclerosis (ICD10-I70.0) and Emphysema (ICD10-J43.9).  Electronically Signed   By: Van Clines M.D.   On: 08/24/2019 14:58    Procedures Procedures (including critical care time)  Medications Ordered in ED Medications  sodium chloride (PF) 0.9 % injection (has no administration in time range)  piperacillin-tazobactam (ZOSYN) IVPB 3.375 g (has no administration in time range)  sodium chloride flush (NS) 0.9 % injection 3 mL (3 mLs Intravenous Given 08/24/19 1158)  sodium chloride 0.9 % bolus 1,000 mL (0 mLs Intravenous Stopped 08/24/19 1449)  ondansetron (ZOFRAN) injection 4 mg (4 mg Intravenous Given 08/24/19 1200)  morphine 4 MG/ML injection 4 mg (4 mg Intravenous Given 08/24/19 1202)  famotidine (PEPCID) IVPB 20 mg premix (0 mg Intravenous Stopped 08/24/19 1258)  iohexol (OMNIPAQUE) 300 MG/ML solution 100 mL (100 mLs Intravenous Contrast Given 08/24/19 1429)    ED Course  I have reviewed the triage vital signs and the nursing notes.  Pertinent labs & imaging results that were available during my care of the patient were reviewed by me and considered in my medical decision making (see chart for details).  84 year old presents for evaluation of epigastric pain.  History of pancreatic cancer followed by oncology, s/p radiation however not currently undergoing treatment.  Has had some weakness as well as diarrhea.  Initially had bright red blood in  her stool however states she was seen by GI earlier today and noted to have dark stool on occult.  She has some tenderness and guarding to her epigastric area.  Negative Murphy sign.  No chronic NSAID use, alcohol use, anticoagulants.  Denies prior history of ulcers.  Plan labs, imaging and reassess  Labs and imaging personally reviewed and interpreted: CBC without leukocytosis, hemoglobin 10.7 similar to previous labs Lipase 14 Metabolic panel with transaminitis with elevated alk phos and T bili 1.3 Hepatitis panel pending Acetaminophen negative COVID negative UA with trace leuks rare bacteria,  she does not have any urinary complaints Patient refused repeat occult here in the ED as she states this was done earlier at her GI office.  Per GI note patient with gross blood per rectum EKG without ischemia CT abdomen pelvis reviewed  Patient reassessed.  Pain controlled. No emesis in ED. Pending CT scan, unfortunately they are behind.  Discern for recurrent biliary obstruction given known history of biliary stent placement.  November 2020 she underwent ERCP, EUS with Dr. Stefani Dama Roddy which she had uncovered metal biliary stent for severe malignant biliary stricture apparently had pus and sludge was removed from the duct at that time.  Per GI note they recommend labs, IVF, repeat CT scan, inpatient endoscopy.  Inpatient GI team was made aware per Zehr, PA-C note.  CONSULT with [place] with [place], GI.  They recommend starting on antibiotics and they will see patient.  She may have clear liquids tonight and n.p.o. after midnight.  Likely further imaging, scope tomorrow.  They will be down to see her in the ED within the hour.  CONSULT with Dr. [place] Pink with [facility] who will evaluate patient for admission.  Patient with multiple medication allergies.  I discussed with Lilia Pro, pharmacist in the ED.  She recommends to proceed with Zosyn for antibiotics given her only prior reaction was a rash.   The patient appears reasonably stabilized for admission considering the current resources, flow, and capabilities available in the ED at this time, and I doubt any other [facility] requiring further screening and/or treatment in the ED prior to admission. Clinical Course as of Aug 23 1541  Tue Aug 23, 1269  2853 84 year old female here with acute onset of upper abdominal pain.  She has a history of pancreatic cancer and has a biliary stent.  Had labs that were fairly unremarkable yesterday.  New elevations in her LFTs.  Will review with GI.  Will need admission to the hospital.   [MB]    Clinical Course User  Index [MB] Hayden Rasmussen, MD   MDM Rules/Calculators/A&P                           Final Clinical Impression(s) / ED Diagnoses Final diagnoses:  Epigastric pain  Elevated LFTs  History of pancreatic cancer    Rx / DC Orders ED Discharge Orders    None       Silvina Hackleman A, PA-C 08/24/19 1543    Hayden Rasmussen, MD 08/24/19 1759

## 2019-08-24 NOTE — Progress Notes (Signed)
Pt arrived to unit via stretcher with one ED staff member. A&O x4 ambulatory. Oriented to room call light in reach

## 2019-08-24 NOTE — Patient Instructions (Signed)
If you are age 82 or older, your body mass index should be between 23-30. Your Body mass index is 25.5 kg/m. If this is out of the aforementioned range listed, please consider follow up with your Primary Care Provider.  If you are age 28 or younger, your body mass index should be between 19-25. Your Body mass index is 25.5 kg/m. If this is out of the aformentioned range listed, please consider follow up with your Primary Care Provider.   Proceed to the ED.  You will be advised when to follow up by the Hospital.

## 2019-08-24 NOTE — H&P (Signed)
[facility] History and Physical  [name] UKG:[id number] DOB: 03-24-1935 DOA: 08/24/2019   PCP: Ronnald Nian, DO  Specialists: Followed by Dr. Fuller Plan with gastroenterology.  Also followed by Dr. Lorenso Courier with medical oncology and Dr. Sondra Come with radiation oncology.  Chief Complaint: Abdominal pain with nausea ongoing for a few days  HPI: [name] is a 84 y.o. female with past medical history of coronary artery disease status post CABG, history of breast cancer status post lumpectomy, completed treatment in 2018.  She was found to have abnormal LFTs in October 2020, pancreatic cancer diagnosed in October 2020.  Underwent CT scan which revealed a mass in the head of the pancreas.  She underwent stent placement.  Underwent biopsy which revealed adenocarcinoma.  Apparently she experienced some complication with the stent and so a metal stent was placed in November 2020.  She is followed by medical oncology as well as radiation oncology.  She is also followed by [facility].  She was in her usual state of health till this past weekend when she started developing nausea.  Never had any vomiting.  This was followed by onset of abdominal pain in the epigastric area.  The pain got worse up to 10 out of 10 in intensity.  Gnawing pain located in the upper abdomen with radiation to the back.  She also had 3 loose stools this morning the last 1 was associated with some blood in the stool.  Has had some chills but denies any fever.  No chest pain or shortness of breath.  She was initially seen in the gastroenterology office where she had an appointment this morning.  She mentioned her symptoms.  She was sent over to the emergency department for further evaluation.  She was found to have a significantly elevated transaminases.  She underwent a CT scan but somewhat stable appearance of the mass but other nonspecific finding suggesting inflammation.  She will need to be  brought into the hospital for further evaluation.  Home Medications: Prior to Admission medications   Medication Sig Start Date End Date Taking? Authorizing Provider  acetaminophen (TYLENOL) 325 MG tablet Take 650 mg by mouth every 6 (six) hours as needed.   Yes [provider]  albuterol (VENTOLIN HFA) 108 (90 Base) MCG/ACT inhaler Inhale 2 puffs into the lungs every 6 (six) hours as needed for wheezing or shortness of breath.    Yes [provider]  amLODipine (NORVASC) 5 MG tablet Take 1 tablet (5 mg total) by mouth daily. Patient taking differently: Take 5 mg by mouth every evening.  12/18/18  Yes Josue Hector, MD  aspirin EC 81 MG tablet Take 81 mg by mouth daily.   Yes [provider]  CALCIUM PO Take 1 tablet by mouth daily with breakfast.    Yes [provider]  EPINEPHrine 0.3 mg/0.3 mL IJ SOAJ injection Inject 0.3 mg into the muscle as needed (as directed- FOR A SEVERE REACTION).  04/01/18  Yes [provider]  fexofenadine (ALLEGRA) 180 MG tablet Take 180 mg by mouth every other day.    Yes [provider]  fluticasone (FLONASE) 50 MCG/ACT nasal spray Place 1-2 sprays into both nostrils daily as needed for allergies or rhinitis.   Yes [provider]  Glucosamine-Chondroit-Vit C-Mn (GLUCOSAMINE CHONDR 1500 COMPLX PO) Take 1 tablet by mouth 2 (two) times daily.    Yes [provider]  hydrALAZINE (APRESOLINE) 50 MG tablet Take 0.5 tablets (25 mg  total) by mouth 3 (three) times daily. Patient taking differently: Take 25 mg by mouth daily.  04/13/18  Yes Lucille Passy, MD  latanoprost (XALATAN) 0.005 % ophthalmic solution Place 1 drop into both eyes at bedtime.  07/31/12  Yes [provider]  losartan (COZAAR) 100 MG tablet TAKE 1/2 TABLET BY MOUTH THREE TIMES A DAY Patient taking differently: Take 50 mg by mouth daily.  04/29/19  Yes Cirigliano, Mary K, DO  metoprolol tartrate (LOPRESSOR) 50 MG tablet Take 1  tablet (50 mg total) by mouth 3 (three) times daily. 01/18/19  Yes Josue Hector, MD  nitroGLYCERIN (NITROSTAT) 0.4 MG SL tablet Place 1 tablet (0.4 mg total) under the tongue every 5 (five) minutes as needed for chest pain. 12/18/18  Yes Josue Hector, MD  pantoprazole (PROTONIX) 40 MG tablet TAKE 1 TABLET BY MOUTH TWICE DAILY( 30 TO 60 MINUTES BEFORE BREAKFAST) Patient taking differently: Take 40 mg by mouth 2 (two) times daily.  07/02/19  Yes Ladene Artist, MD  Psyllium (METAMUCIL FIBER PO) Take 1 Scoop by mouth daily. Mix one rounded teaspoonful into 8 oz water and drink once a day   Yes [provider]  rosuvastatin (CRESTOR) 5 MG tablet TAKE 1 TABLET BY MOUTH 3 TIMES A WEEK. (TAKE ON MONDAY, WEDNESDAY, AND FRIDAY) Patient taking differently: Take 5 mg by mouth every Monday, Wednesday, and Friday.  05/13/19  Yes Cirigliano, Mary K, DO  simethicone (MYLICON) 947 MG chewable tablet Chew 125 mg by mouth every 6 (six) hours as needed for flatulence.   Yes [provider]  sodium chloride (OCEAN) 0.65 % SOLN nasal spray Place 1 spray into both nostrils daily as needed for congestion.    Yes [provider]  sucralfate (CARAFATE) 1 g tablet TAKE 1 TABLET BY MOUTH BY MOUTH TWICE DAILY(ONE BEFORE DINNER) Patient taking differently: Take 1 g by mouth 2 (two) times daily.  07/15/19  Yes Ladene Artist, MD  traMADol (ULTRAM) 50 MG tablet Take 1/2 to 1 tablet by mouth three times daily as needed Patient taking differently: Take 25-50 mg by mouth 3 (three) times daily as needed for moderate pain.  12/08/18  Yes Ladene Artist, MD  vitamin B-12 (CYANOCOBALAMIN) 1000 MCG tablet Take 1,000 mcg by mouth daily.     Yes [provider]  VITAMIN D PO Take 200 mcg by mouth daily with breakfast. Vitamin A  300 mcg Calcium  40 mcg   Yes [provider]  diphenhydrAMINE (BENADRYL) 50 MG tablet Take 0.5 tablets (25 mg total) by mouth 2 (two) times daily. Patient not  taking: Reported on 08/24/2019 01/04/19   Orson Slick, MD    Allergies:  Allergies  Allergen Reactions  . Levaquin [Levofloxacin In D5w]     Elevated BP  . Choline Fenofibrate Other (See Comments)     pt states INTOL to Trilipix w/ "thigh burning"  . Hctz [Hydrochlorothiazide]     Causes hyponatremia  . Simvastatin Other (See Comments)     pt states INTOL to STATINS \\T \ refuses to restart  . Adhesive [Tape] Rash  . Bentyl [Dicyclomine Hcl] Other (See Comments)    "Made me feel weird and drained me"  . Ceclor [Cefaclor] Rash  . Clarithromycin Rash  . Codeine Nausea Only  . Doxycycline Rash  . Hydrocodone     Nightmare after taking cough syrup w/hydrocodone  . Lisinopril Cough    Developed ACE cough...  . Penicillins Itching, Rash  and Other (See Comments)    At injection site Did it involve swelling of the face/tongue/throat, SOB, or low BP? No Did it involve sudden or severe rash/hives, skin peeling, or any reaction on the inside of your mouth or nose? No Did you need to seek medical attention at a hospital or doctor's office? No When did it last happen?"It was a long time ago" If all above answers are "NO", may proceed with cephalosporin use.   . Tobramycin-Dexamethasone Rash    Past Medical History: Past Medical History:  Diagnosis Date  . Asthma   . Atrophic vaginitis   . Breast cancer, Left 12/20/2010   NO BLOOD PRESSURE CHECKS OR STICKS IN LEFT ARM  . CAD (coronary artery disease)    a. 01/19/2010 s/p CABG x 3, lima->lad, vg->diag, vg->om1;  b. 06/2011 :Lexi MV: EF 84%, No ischemia. c. 01/06/14 s/p negative nuclear stress test with EF >70%  . Cervical arthritis   . Colonic polyp 04-27-2009   tubular adenoma  . Diverticulosis of colon (without mention of hemorrhage)   . Fibromyalgia   . Gallstones   . GERD (gastroesophageal reflux disease)   . Glaucoma   . Headache(784.0)    a. frequently assocaited with high BPs  . Hiatal hernia   . Hypercholesterolemia     . Irritable bowel syndrome   . Labile hypertension   . Lymphedema    left arm  . Pancreatic cancer ([place]) dx'd 11/2018  . Pinched vertebral nerve   . Secondary diabetes ([place]) 12/03/2018    Past Surgical History:  Procedure Laterality Date  . [place] STUDY N/A 01/08/2016   Procedure: [place] STUDY;  Surgeon: Ladene Artist, MD;  Location: [facility] ENDOSCOPY;  Service: Endoscopy;  Laterality: N/A;  . BILIARY BRUSHING  11/28/2018   Procedure: BILIARY BRUSHING;  Surgeon: Gatha Mayer, MD;  Location: [facility] ENDOSCOPY;  Service: Endoscopy;;  . BILIARY DILATION  01/06/2019   Procedure: BILIARY DILATION;  Surgeon: Irving Copas., MD;  Location: [facility] ENDOSCOPY;  Service: Endoscopy;;  . BILIARY STENT PLACEMENT  11/28/2018   Procedure: BILIARY STENT PLACEMENT;  Surgeon: Gatha Mayer, MD;  Location[facility] ENDOSCOPY;  Service: Endoscopy;;  . BILIARY STENT PLACEMENT N/A 01/06/2019   Procedure: BILIARY STENT PLACEMENT;  Surgeon: Irving Copas., MD;  Location: [facility] ENDOSCOPY;  Service: Endoscopy;  Laterality: N/A;  . BREAST LUMPECTOMY Left 02/06/11  . CATARACT EXTRACTION, BILATERAL     bilateral caaract removal,  . COLONOSCOPY    . CORONARY ANGIOPLASTY WITH STENT PLACEMENT     Stent 2007  . CORONARY ARTERY BYPASS GRAFT    . ENDOSCOPIC RETROGRADE CHOLANGIOPANCREATOGRAPHY (ERCP) WITH PROPOFOL N/A 01/06/2019   Procedure: ENDOSCOPIC RETROGRADE CHOLANGIOPANCREATOGRAPHY (ERCP) WITH PROPOFOL;  Surgeon: Rush Landmark Telford Nab., MD;  Location: [facility] ENDOSCOPY;  Service: Endoscopy;  Laterality: N/A;  [id number]  . ERCP N/A 11/28/2018   Procedure: ENDOSCOPIC RETROGRADE CHOLANGIOPANCREATOGRAPHY (ERCP);  Surgeon: Gatha Mayer, MD;  Location: [facility] ENDOSCOPY;  Service: Endoscopy;  Laterality: N/A;  . ESOPHAGEAL MANOMETRY N/A 01/08/2016   Procedure: ESOPHAGEAL MANOMETRY (EM);  Surgeon: Ladene Artist, MD;  Location: [facility] ENDOSCOPY;  Service: Endoscopy;  Laterality: N/A;  . ESOPHAGOGASTRODUODENOSCOPY    .  ESOPHAGOGASTRODUODENOSCOPY N/A 01/06/2019   Procedure: ESOPHAGOGASTRODUODENOSCOPY (EGD);  Surgeon: Rush Landmark Telford Nab., MD;  Location: [facility];  Service: Endoscopy;  Laterality: N/A;  . ESOPHAGOGASTRODUODENOSCOPY (EGD) WITH PROPOFOL N/A 12/10/2018   Procedure: ESOPHAGOGASTRODUODENOSCOPY (EGD) WITH PROPOFOL;  Surgeon: [name], MD;  Location: [facility] ENDOSCOPY;  Service: Endoscopy;  Laterality: N/A;  . EUS N/A 12/10/2018   Procedure: UPPER ENDOSCOPIC ULTRASOUND (EUS) RADIAL;  Surgeon: [name], MD;  Location: [facility] ENDOSCOPY;  Service: Endoscopy;  Laterality: N/A;  . EUS N/A 01/06/2019   Procedure: FULL UPPER ENDOSCOPIC ULTRASOUND (EUS) RADIAL;  Surgeon: Rush Landmark Telford Nab., MD;  Location: [facility] ENDOSCOPY;  Service: Endoscopy;  Laterality: N/A;  . FIDUCIAL MARKER PLACEMENT  01/06/2019   Procedure: FIDUCIAL MARKER PLACEMENT;  Surgeon: Rush Landmark Telford Nab., MD;  Location: [facility] ENDOSCOPY;  Service: Endoscopy;;  . FINE NEEDLE ASPIRATION N/A 12/10/2018   Procedure: FINE NEEDLE ASPIRATION (FNA) LINEAR;  Surgeon: [name], MD;  Location: [facility] ENDOSCOPY;  Service: Endoscopy;  Laterality: N/A;  . SPHINCTEROTOMY  01/06/2019   Procedure: SPHINCTEROTOMY;  Surgeon: Rush Landmark Telford Nab., MD;  Location: [facility] ENDOSCOPY;  Service: Endoscopy;;  . STENT REMOVAL  01/06/2019   Procedure: STENT REMOVAL;  Surgeon: Irving Copas., MD;  Location: [facility] ENDOSCOPY;  Service: Endoscopy;;    Social History: Lives with her husband.  Denies smoking alcohol use illicit drug use.  Usually independent with daily activities.  Family History:  Family History  Problem Relation Age of Onset  . Stroke Mother   . Stroke Father   . Prostate cancer Father   . Breast cancer Sister   . Diabetes Sister   . Cancer Brother        gland cancer  . Heart disease Brother   . Colon cancer Neg Hx   . Stomach cancer Neg Hx   . Pancreatic cancer Neg Hx   . Kidney disease Neg Hx   . Liver disease Neg Hx        Review of Systems - History obtained from the patient General ROS: positive for  - fatigue Psychological ROS: negative Ophthalmic ROS: negative ENT ROS: negative Allergy and Immunology ROS: negative Hematological and Lymphatic ROS: negative Endocrine ROS: negative Respiratory ROS: no cough, shortness of breath, or wheezing Cardiovascular ROS: no chest pain or dyspnea on exertion Gastrointestinal ROS: As in HPI Genito-Urinary ROS: no dysuria, trouble voiding, or hematuria Musculoskeletal ROS: negative Neurological ROS: no TIA or stroke symptoms Dermatological ROS: negative  Physical Examination  Vitals:   08/24/19 1445 08/24/19 1446 08/24/19 1450 08/24/19 1545  BP:   (!) 154/70 (!) 168/90  Pulse: 91 88 84 86  Resp: (!) 22 (!) 21 (!) 22 15  Temp:      TempSrc:      SpO2: 97% 97% 96% 97%  Weight:      Height:        BP (!) 168/90   Pulse 86   Temp 98.1 F (36.7 C) (Oral)   Resp 15   Ht 4\' 10"  (1.473 m)   Wt 55.3 kg   LMP 11/26/1990   SpO2 97%   BMI 25.50 kg/m   General appearance: alert, cooperative, appears stated age and no distress Head: Normocephalic, without obvious abnormality, atraumatic Eyes: conjunctivae/corneas clear. PERRL, EOM's intact.  Throat: lips, mucosa, and tongue normal; teeth and gums normal Neck: no adenopathy, no carotid bruit, no JVD, supple, symmetrical, trachea midline and thyroid not enlarged, symmetric, no tenderness/mass/nodules Resp: clear to auscultation bilaterally Cardio: regular rate and rhythm, S1, S2 normal, no murmur, click, rub or gallop GI: Abdomen is soft.  Tenderness appreciated in the epigastric area without any rebound rigidity or guarding.  No masses organomegaly.  Bowel sounds present. Extremities: extremities normal, atraumatic, no cyanosis or edema Pulses: 2+ and symmetric Skin: Skin color, texture,  turgor normal. No rashes or lesions Lymph nodes: Cervical, supraclavicular, and axillary nodes normal. Neurologic:  Alert and oriented x3.  No obvious focal neurological deficits.    Labs on Admission: I have personally reviewed following labs and imaging studies  CBC: Recent Labs  Lab 08/23/19 1116 08/24/19 1148  WBC 6.9 7.5  NEUTROABS 3.9  --   HGB 10.8* 10.7*  HCT 32.5* 32.1*  MCV 92.3 92.0  PLT 274 381   Basic Metabolic Panel: Recent Labs  Lab 08/23/19 1116 08/24/19 1148  NA 134* 135  K 4.6 4.2  CL 97* 96*  CO2 28 28  GLUCOSE 123* 115*  BUN 13 11  CREATININE 0.82 0.58  CALCIUM 9.4 9.0   GFR: Estimated Creatinine Clearance: 40.7 mL/min (by C-G formula based on SCr of 0.58 mg/dL). Liver Function Tests: Recent Labs  Lab 08/23/19 1116 08/24/19 1148  AST 20 552*  ALT 24 273*  ALKPHOS 108 154*  BILITOT 0.8 1.3*  PROT 7.4 7.5  ALBUMIN 3.8 4.1   Recent Labs  Lab 08/24/19 1148  LIPASE 14    Radiological Exams on Admission: CT ABDOMEN PELVIS W CONTRAST  Result Date: 08/24/2019 CLINICAL DATA:  Epigastric abdominal pain. Blood in stool. Pancreatic adenocarcinoma. Remote history of breast cancer. EXAM: CT ABDOMEN AND PELVIS WITH CONTRAST TECHNIQUE: Multidetector CT imaging of the abdomen and pelvis was performed using the standard protocol following bolus administration of intravenous contrast. CONTRAST:  169mL OMNIPAQUE IOHEXOL 300 MG/ML  SOLN COMPARISON:  08/03/2019 FINDINGS: Lower chest: Stable scarring or subsegmental atelectasis in the lung bases. Centrilobular emphysema. Old granulomatous disease. Descending thoracic aortic atherosclerotic calcification. Hepatobiliary: Pneumobilia. Biliary stent remains in place. No compelling findings of metastatic disease to the liver. No overt gallbladder wall thickening. Pancreas: Prominently dilated dorsal pancreatic duct similar to prior, extending to an infiltrative mass in the head of the pancreas which surrounds the stent. Several fiducials remain in place. Overall the pancreatic mass does not appear significantly changed, measuring  roughly 3.1 by 2.8 cm on image 26/2. Mild surrounding inflammatory stranding along the pancreatic head. Spleen: Unchanged with 3 small hypodense lesions which are probably benign but technically nonspecific. Adrenals/Urinary Tract: Unremarkable Stomach/Bowel: Indistinctness of tissue planes around the pancreatic head and adjacent portions of the duodenum, pancreatitis and duodenitis not exclude and some of this may be related to prior radiation therapy. Overall the degree of stranding around the descending and transverse duodenum is similar to 08/03/2019 and no dilation of the stomach or bowel is observed. Sigmoid colon diverticulosis is present. No active diverticulitis identified. Vascular/Lymphatic: The pancreatic head mass narrows the confluence of the splenic vein and the SMV into the portal vein. Aortoiliac atherosclerotic vascular disease. Stable prominence of parametrial venous structures, left greater than right, pelvic congestion syndrome not excluded. Reproductive: Unremarkable Other: Low-grade stranding in the central mesentery, minimally increased from prior, nonspecific. Musculoskeletal: Degenerative arthropathy of both hips. Grade 1 degenerative anterolisthesis at L4-5. Multilevel lumbar spondylosis and degenerative disc disease with left greater than right foraminal impingement at L5-S1. central narrowing of the thecal sac at the L4-5 level. IMPRESSION: 1. Similar appearance of the infiltrative mass in the head of the pancreas with surrounding fiducials. The mass narrows the confluence of the splenic vein and the SMV into the portal vein. 2. Indistinctness of tissue planes around the pancreatic head and adjacent portions of the duodenum, pancreatitis and duodenitis could cause this appearance, and radiation therapy in the past may be predisposing. 3. Other imaging findings of potential clinical significance:  Sigmoid colon diverticulosis. Stable prominence of parametrial venous structures, left  greater than right, pelvic congestion syndrome not excluded. Multilevel lumbar spondylosis and degenerative disc disease causing left greater than right foraminal impingement at L5-S1. 4. Emphysema and aortic atherosclerosis. Aortic Atherosclerosis (ICD10-I70.0) and Emphysema (ICD10-J43.9). Electronically Signed   By: Van Clines M.D.   On: 08/24/2019 14:58    My interpretation of Electrocardiogram: Sinus rhythm in the 70s.  Normal axis.  No concerning ST or T wave changes.  Intervals are normal.  Similar to previous EKG   Problem List  Principal Problem:   Epigastric pain Active Problems:   Asthma   Breast cancer, Left   Atherosclerosis of coronary artery bypass graft of native heart with angina pectoris with documented spasm (HCC)   OSA on CPAP   Adenocarcinoma of head of pancreas (HCC)   Transaminitis   Assessment: This is a 84 year old Caucasian female with past medical history significant for pancreatic cancer, mass in the head of pancreas, coronary artery disease who comes in with abdominal pain and nausea ongoing for 2 to 3 days.  She is found to have elevated transaminases with mildly abnormal bilirubin and alkaline phosphatase.  Nonspecific findings noted on CT scan.  She has a biliary stent.  Differential diagnosis include pancreatitis even though her lipase level is normal.  Gallbladder issues are a consideration although gallbladder was not abnormal on CT scan.  Cholangitis is another possibility.  There could be obstruction of the biliary stent.  The alkaline phosphatase and bilirubin are only minimally elevated.  Plan: 1. Epigastric abdominal pain in the setting of pancreatic cancer: Patient has a biliary stent which was placed in November 2020.  Gastroenterology has been consulted and will follow the patient.  Clear liquid diet for now.  N.p.o. past midnight.  Continue with pain medications.  Antibiotics recommended by gastroenterology.  2.  Transaminitis: Recheck her  liver function test tomorrow.  Tylenol level was less than 10.  Avoid hepatotoxic agents.  Check coags.  3.  Pancreatic cancer with mass in the head of pancreas with biliary stent in situ: Followed by medical and radiation oncology.  She has completed radiation treatments.  Has elected not to do chemotherapy.  Currently getting supportive treatment.  4. Previous history of breast cancer in the left breast status post lumpectomy: She completed treatment in 2018.  5.  Cancer related pain: As above.  Pain medications.  Bowel regimen.  6.  Hematochezia: She mentioned that she saw some blood in her stool this morning.  Rectal exam done in the gastroenterology office revealed dark bloody stool.  No external abnormalities noted.  Reason for hematochezia is not entirely clear.  CT scan did show some diverticulosis.  Defer this to gastroenterology.  Check hemoglobin tomorrow morning.  7.  History of coronary artery disease status post CABG: Cardiac status seems to be stable.  EKG was reviewed.  Continue her beta-blocker.  Hold statin due to elevated LFTs.  8.  Essential hypertension: Blood pressure noted to be elevated.  Continue her antihypertensives.  9. Normocytic anemia: Hemoglobin noted to be close to her baseline.  Continue to monitor.   DVT Prophylaxis: SCDs for now Code Status: Full Code.  Discussed with patient and her husband.  She would like CPR intubation but not kept on it if there is no meaningful chance of recovery. Family Communication: Discussed with the patient and her husband Disposition: Hopefully home when ready for discharge Consults called: Gastroenterology.  Will notify her medical oncologist  as well. Admission Status: Status is: Inpatient  Remains inpatient appropriate because:Ongoing active pain requiring inpatient pain management, IV treatments appropriate due to intensity of illness or inability to take PO and Inpatient level of care appropriate due to severity of  illness   Dispo: The patient is from: Home              Anticipated d/c is to: Home              Anticipated d/c date is: 2 days              Patient currently is not medically stable to d/c.      Severity of Illness: The appropriate patient status for this patient is INPATIENT. Inpatient status is judged to be reasonable and necessary in order to provide the required intensity of service to ensure the patient's safety. The patient's presenting symptoms, physical exam findings, and initial radiographic and laboratory data in the context of their chronic comorbidities is felt to place them at high risk for further clinical deterioration. Furthermore, it is not anticipated that the patient will be medically stable for discharge from the hospital within 2 midnights of admission. The following factors support the patient status of inpatient.   " The patient's presenting symptoms include abdominal pain. " The worrisome physical exam findings include abdominal tenderness. " The initial radiographic and laboratory data are worrisome because of transaminitis. " The chronic co-morbidities include pancreatic cancer.   * I certify that at the point of admission it is my clinical judgment that the patient will require inpatient hospital care spanning beyond 2 midnights from the point of admission due to high intensity of service, high risk for further deterioration and high frequency of surveillance required.*    Further management decisions will depend on results of further testing and patient's response to treatment.   Lowen Mansouri [employer]  [facility] operational officer on [email or web address]  08/24/2019, 3:55 PM

## 2019-08-24 NOTE — Progress Notes (Signed)
Reviewed and agree with management plan.  Ashleyanne Hemmingway T. Regene Mccarthy, MD FACG [facility]  

## 2019-08-24 NOTE — H&P (View-Only) (Signed)
Patient ID: [name], female   DOB: 05/09/1935, 84 y.o.   MRN: [id number]    Progress Note   Subjective  Patient sent to the ER from the office today with complaints of severe abdominal pain and onset of rectal bleeding today. Patient with history of pancreatic adenocarcinoma, diagnosed 12/2018.  She has an uncovered metal biliary stent in place for malignant stricture.  She is being followed by oncology and completed radiation 03/2019 .   Most recent CT 08/03/2019 showed slight decrease in size of pancreatic head mass, suspect radiation induced pancreatitis and duodenitis.  SMV abuts the junction of the pancreatic duct dilation and had lesion.  Labs-WBC 7.5, hemoglobin 10.7 stable, was 11.65-monthago T bili 1.3/alk phos 154/ALT 273/AST 552-normal yesterday Lipase wnl  CT of the abdomen and pelvis  today - biliary stent in place, infiltrating pancreatic mass in pancreatic head surrounds the stent., overall mass size unchanged. There is indistinctness of tissue planes around duodenum,-pancreatitis /duodenitis not excluded  Patient has been hemodynamically stable in the ER.  Currently comfortable after analgesic, and denying nausea.  She did have what sounds like a shaking chill earlier this afternoon, no fever.  No further bowel movements or bleeding.     Objective   Vital signs in last 24 hours: Temp:  [98.1 F (36.7 C)] 98.1 F (36.7 C) (06/29 1020) Pulse Rate:  [67-85] 84 (06/29 1255) Resp:  [11-22] 13 (06/29 1255) BP: (140-177)/(35-72) 177/35 (06/29 1255) SpO2:  [85 %-100 %] 98 % (06/29 1255) Weight:  [55.3 kg] 55.3 kg (06/29 1023)   General:  eld  white female in NAD, husband at bedside Heart:  Regular rate and rhythm; no murmurs Lungs: Respirations even and unlabored, lungs CTA bilaterally Abdomen:  Soft,tender across upper abdomen,nondistended. Normal bowel sounds. Extremities:  Without edema. Neurologic:  Alert and oriented,  grossly normal neurologically. Psych:   Cooperative. Normal mood and affect.  Intake/Output from previous day: No intake/output data recorded. Intake/Output this shift: No intake/output data recorded.  Lab Results: Recent Labs    08/23/19 1116 08/24/19 1148  WBC 6.9 7.5  HGB 10.8* 10.7*  HCT 32.5* 32.1*  PLT 274 272   BMET Recent Labs    08/23/19 1116 08/24/19 1148  NA 134* 135  K 4.6 4.2  CL 97* 96*  CO2 28 28  GLUCOSE 123* 115*  BUN 13 11  CREATININE 0.82 0.58  CALCIUM 9.4 9.0   LFT Recent Labs    08/24/19 1148  PROT 7.5  ALBUMIN 4.1  AST 552*  ALT 273*  ALKPHOS 154*  BILITOT 1.3*   PT/INR No results for input(s): LABPROT, INR in the last 72 hours.  Studies/Results: No results found.     Assessment / Plan:    #[age]year old white female with pancreatic adenocarcinoma who completed radiation February 2021.  She had biliary obstruction at diagnosis and currently has an uncovered metal stent. Presented today with acute onset of abdominal pain, severe this morning.  She had had one episode of rectal bleeding over the weekend, and had another episode of dark red blood per rectum today.  Work-up in the ER with labs and CT, most consistent with stent occlusion.  She may have had bleeding secondary to hemobilia, or inflammatory process in the duodenum, contributing to impending biliary obstruction, no evidence by CT but also consider erosion of tumor into the duodenum.  #2 mild anemia stable  Plan; clear liquids this evening, n.p.o. in a.m. Pharmacy has consulted for antibiotics due  to multiple allergies, placed on Zosyn Repeat labs in a.m. to include INR Patient has been scheduled for EGD and ERCP with Dr. Lyndel Safe for 130 tomorrow afternoon.  Procedure was discussed in detail with the patient and her husband including indications risks and benefits and she is agreeable to proceed. Thank you we will follow with you       LOS: 0 days   Amy EsterwoodPA-C  08/24/2019, 2:04 PM    Attending  physician's note   I have taken a history, examined the patient and reviewed the chart. I agree with the Advanced Practitioner's note, impression and recommendations.  84 year old female with pancreatic head adenocarcinoma, non operable s/p uncovered metal biliary stent placement for malignant stricture November 2020 admitted with worsening abdominal pain and hematochezia  CT abdomen pelvis showed infiltrative pancreatic head lesion surrounding the stent and eroding into the duodenum  Elevated LFTs  Plan for EGD and ERCP with Dr. Lyndel Safe tomorrow for evaluation and to debride the biliary stent if needed with additional stent placement  IV Zosyn  N.p.o. after 5 AM   K. Denzil Magnuson , MD [phone]

## 2019-08-24 NOTE — Progress Notes (Signed)
Pharmacy Antibiotic Note  [name] is a 84 y.o. female with pancreatic cancer with biliary stent admitted on 08/24/2019 with epigastric pain.  Pharmacy has been consulted for Zosyn dosing for IAI.  Plan: Zosyn 3.375gm IV q8h (4hr extended infusions) Follow up renal function & cultures if available   Height: 4\' 10"  (147.3 cm) Weight: 55.3 kg (122 lb) IBW/kg (Calculated) : 40.9  Temp (24hrs), Avg:98.1 F (36.7 C), Min:98.1 F (36.7 C), Max:98.1 F (36.7 C)  Recent Labs  Lab 08/23/19 1116 08/24/19 1148  WBC 6.9 7.5  CREATININE 0.82 0.58    Estimated Creatinine Clearance: 40.7 mL/min (by C-G formula based on SCr of 0.58 mg/dL).    Allergies  Allergen Reactions  . Levaquin [Levofloxacin In D5w]     Elevated BP  . Choline Fenofibrate Other (See Comments)     pt states INTOL to Trilipix w/ "thigh burning"  . Hctz [Hydrochlorothiazide]     Causes hyponatremia  . Simvastatin Other (See Comments)     pt states INTOL to STATINS \\T \ refuses to restart  . Adhesive [Tape] Rash  . Bentyl [Dicyclomine Hcl] Other (See Comments)    "Made me feel weird and drained me"  . Ceclor [Cefaclor] Rash  . Clarithromycin Rash  . Codeine Nausea Only  . Doxycycline Rash  . Hydrocodone     Nightmare after taking cough syrup w/hydrocodone  . Lisinopril Cough    Developed ACE cough...  . Penicillins Itching, Rash and Other (See Comments)    At injection site Did it involve swelling of the face/tongue/throat, SOB, or low BP? No Did it involve sudden or severe rash/hives, skin peeling, or any reaction on the inside of your mouth or nose? No Did you need to seek medical attention at a hospital or doctor's office? No When did it last happen?"It was a long time ago" If all above answers are "NO", may proceed with cephalosporin use.   . Tobramycin-Dexamethasone Rash    Antimicrobials this admission: 6/29 Zosyn >>  Dose adjustments this admission:   Microbiology results: 6/29  COVID: neg  Thank you for allowing pharmacy to be a part of this patient's care.  Peggyann Juba, PharmD, BCPS Pharmacy: [phone] 08/24/2019 4:02 PM

## 2019-08-24 NOTE — Progress Notes (Signed)
Pt declined use of cpap tonight.  Pt was encouraged to call should she change her mind.

## 2019-08-24 NOTE — Telephone Encounter (Signed)
Received TC from patients daughter [name] stating that patient was admitted this morning. Patient was having bloody diarrhea as well as severe abdominal pain. Sent staff message to Dr Lorenso Courier to make him aware. Also cc: his nurse Engineer, agricultural. Let them both know that patient's daughter [name] can be reached at [phone] if they have any questions.

## 2019-08-25 ENCOUNTER — Inpatient Hospital Stay (HOSPITAL_COMMUNITY): Payer: Medicare Other | Admitting: Anesthesiology

## 2019-08-25 ENCOUNTER — Inpatient Hospital Stay (HOSPITAL_COMMUNITY): Payer: Medicare Other

## 2019-08-25 ENCOUNTER — Encounter (HOSPITAL_COMMUNITY)
Admission: EM | Disposition: A | Discharge status: Home / Self Care | Payer: Self-pay | Source: Home / Self Care | Attending: Internal Medicine

## 2019-08-25 ENCOUNTER — Encounter (HOSPITAL_COMMUNITY): Payer: Self-pay | Admitting: Internal Medicine

## 2019-08-25 DIAGNOSIS — C50512 Malignant neoplasm of lower-outer quadrant of left female breast: Secondary | ICD-10-CM

## 2019-08-25 DIAGNOSIS — K831 Obstruction of bile duct: Secondary | ICD-10-CM

## 2019-08-25 DIAGNOSIS — I10 Essential (primary) hypertension: Secondary | ICD-10-CM

## 2019-08-25 DIAGNOSIS — K922 Gastrointestinal hemorrhage, unspecified: Secondary | ICD-10-CM

## 2019-08-25 DIAGNOSIS — Z170 Estrogen receptor positive status [ER+]: Secondary | ICD-10-CM

## 2019-08-25 DIAGNOSIS — J4520 Mild intermittent asthma, uncomplicated: Secondary | ICD-10-CM

## 2019-08-25 HISTORY — PX: REMOVAL OF STONES: SHX5545

## 2019-08-25 HISTORY — PX: BILIARY STENT PLACEMENT: SHX5538

## 2019-08-25 HISTORY — PX: ESOPHAGOGASTRODUODENOSCOPY: SHX5428

## 2019-08-25 HISTORY — PX: ERCP: SHX5425

## 2019-08-25 LAB — TRANSFERRIN: Transferrin: 187 mg/dL — ABNORMAL LOW (ref 192–382)

## 2019-08-25 LAB — IRON AND TIBC
Iron: 16 ug/dL — ABNORMAL LOW (ref 28–170)
Saturation Ratios: 6 % — ABNORMAL LOW (ref 10.4–31.8)
TIBC: 261 ug/dL (ref 250–450)
UIBC: 245 ug/dL

## 2019-08-25 LAB — COMPREHENSIVE METABOLIC PANEL
ALT: 338 U/L — ABNORMAL HIGH (ref 0–44)
AST: 326 U/L — ABNORMAL HIGH (ref 15–41)
Albumin: 3.6 g/dL (ref 3.5–5.0)
Alkaline Phosphatase: 256 U/L — ABNORMAL HIGH (ref 38–126)
Anion gap: 8 (ref 5–15)
BUN: 10 mg/dL (ref 8–23)
CO2: 29 mmol/L (ref 22–32)
Calcium: 8.7 mg/dL — ABNORMAL LOW (ref 8.9–10.3)
Chloride: 96 mmol/L — ABNORMAL LOW (ref 98–111)
Creatinine, Ser: 0.69 mg/dL (ref 0.44–1.00)
GFR calc Af Amer: >60 mL/min (ref >60)
GFR calc non Af Amer: >60 mL/min (ref >60)
Glucose, Bld: 184 mg/dL — ABNORMAL HIGH (ref 70–99)
Potassium: 4.3 mmol/L (ref 3.5–5.1)
Sodium: 133 mmol/L — ABNORMAL LOW (ref 135–145)
Total Bilirubin: 3.1 mg/dL — ABNORMAL HIGH (ref 0.3–1.2)
Total Protein: 6.8 g/dL (ref 6.5–8.1)

## 2019-08-25 LAB — CBC WITH DIFFERENTIAL/PLATELET
Abs Immature Granulocytes: 0.02 10*3/uL (ref 0.00–0.07)
Basophils Absolute: 0 10*3/uL (ref 0.0–0.1)
Basophils Relative: 0 %
Eosinophils Absolute: 0 10*3/uL (ref 0.0–0.5)
Eosinophils Relative: 0 %
HCT: 26.4 % — ABNORMAL LOW (ref 36.0–46.0)
Hemoglobin: 8.7 g/dL — ABNORMAL LOW (ref 12.0–15.0)
Immature Granulocytes: 0 %
Lymphocytes Relative: 24 %
Lymphs Abs: 2.2 10*3/uL (ref 0.7–4.0)
MCH: 30.7 pg (ref 26.0–34.0)
MCHC: 33 g/dL (ref 30.0–36.0)
MCV: 93.3 fL (ref 80.0–100.0)
Monocytes Absolute: 0.2 10*3/uL (ref 0.1–1.0)
Monocytes Relative: 2 %
Neutro Abs: 6.6 10*3/uL (ref 1.7–7.7)
Neutrophils Relative %: 74 %
Platelets: 277 10*3/uL (ref 150–400)
RBC: 2.83 MIL/uL — ABNORMAL LOW (ref 3.87–5.11)
RDW: 14.3 % (ref 11.5–15.5)
WBC: 9 10*3/uL (ref 4.0–10.5)
nRBC: 0 % (ref 0.0–0.2)

## 2019-08-25 LAB — CBC
HCT: 25.1 % — ABNORMAL LOW (ref 36.0–46.0)
Hemoglobin: 8.3 g/dL — ABNORMAL LOW (ref 12.0–15.0)
MCH: 30.7 pg (ref 26.0–34.0)
MCHC: 33.1 g/dL (ref 30.0–36.0)
MCV: 93 fL (ref 80.0–100.0)
Platelets: 201 10*3/uL (ref 150–400)
RBC: 2.7 MIL/uL — ABNORMAL LOW (ref 3.87–5.11)
RDW: 14.6 % (ref 11.5–15.5)
WBC: 10.1 10*3/uL (ref 4.0–10.5)
nRBC: 0 % (ref 0.0–0.2)

## 2019-08-25 LAB — HEMOGLOBIN AND HEMATOCRIT, BLOOD
HCT: 22.4 % — ABNORMAL LOW (ref 36.0–46.0)
Hemoglobin: 7.2 g/dL — ABNORMAL LOW (ref 12.0–15.0)

## 2019-08-25 LAB — HEPATIC FUNCTION PANEL
ALT: 335 U/L — ABNORMAL HIGH (ref 0–44)
AST: 324 U/L — ABNORMAL HIGH (ref 15–41)
Albumin: 3.7 g/dL (ref 3.5–5.0)
Alkaline Phosphatase: 243 U/L — ABNORMAL HIGH (ref 38–126)
Bilirubin, Direct: 1.8 mg/dL — ABNORMAL HIGH (ref 0.0–0.2)
Indirect Bilirubin: 1.2 mg/dL — ABNORMAL HIGH (ref 0.3–0.9)
Total Bilirubin: 3 mg/dL — ABNORMAL HIGH (ref 0.3–1.2)
Total Protein: 6.5 g/dL (ref 6.5–8.1)

## 2019-08-25 LAB — LACTIC ACID, PLASMA
Lactic Acid, Venous: 3.3 mmol/L (ref 0.5–1.9)
Lactic Acid, Venous: 2.5 mmol/L (ref 0.5–1.9)

## 2019-08-25 LAB — PROTIME-INR
INR: 1.1 (ref 0.8–1.2)
Prothrombin Time: 14.2 seconds (ref 11.4–15.2)

## 2019-08-25 LAB — FERRITIN: Ferritin: 334 ng/mL — ABNORMAL HIGH (ref 11–307)

## 2019-08-25 LAB — ABO/RH: ABO/RH(D): A POS

## 2019-08-25 LAB — GLUCOSE, CAPILLARY
Glucose-Capillary: 158 mg/dL — ABNORMAL HIGH (ref 70–99)
Glucose-Capillary: 128 mg/dL — ABNORMAL HIGH (ref 70–99)

## 2019-08-25 SURGERY — ERCP, WITH INTERVENTION IF INDICATED
Anesthesia: General

## 2019-08-25 MED ORDER — PROPOFOL 10 MG/ML IV BOLUS
INTRAVENOUS | Status: AC
  Filled 2019-08-25: qty 20

## 2019-08-25 MED ORDER — PHENYLEPHRINE 40 MCG/ML (10ML) SYRINGE FOR IV PUSH (FOR BLOOD PRESSURE SUPPORT)
PREFILLED_SYRINGE | INTRAVENOUS | Status: DC | PRN
  Administered 2019-08-25 (×2): 80 ug via INTRAVENOUS

## 2019-08-25 MED ORDER — INDOMETHACIN 50 MG RE SUPP
RECTAL | Status: AC
  Filled 2019-08-25: qty 2

## 2019-08-25 MED ORDER — SODIUM CHLORIDE 0.9 % IV SOLN: INTRAVENOUS | Status: DC | PRN

## 2019-08-25 MED ORDER — ONDANSETRON HCL 4 MG/2ML IJ SOLN
INTRAMUSCULAR | Status: DC | PRN
  Administered 2019-08-25: 4 mg via INTRAVENOUS

## 2019-08-25 MED ORDER — ROCURONIUM BROMIDE 10 MG/ML (PF) SYRINGE
PREFILLED_SYRINGE | INTRAVENOUS | Status: DC | PRN
  Administered 2019-08-25: 40 mg via INTRAVENOUS

## 2019-08-25 MED ORDER — SODIUM CHLORIDE 0.9 % IV SOLN
INTRAVENOUS | Status: DC | PRN
  Administered 2019-08-25: 20 mL

## 2019-08-25 MED ORDER — ORAL CARE MOUTH RINSE
15.0000 mL | Freq: Two times a day (BID) | OROMUCOSAL | Status: DC
  Administered 2019-08-25 – 2019-08-28 (×7): 15 mL via OROMUCOSAL

## 2019-08-25 MED ORDER — DEXAMETHASONE SODIUM PHOSPHATE 10 MG/ML IJ SOLN
INTRAMUSCULAR | Status: DC | PRN
  Administered 2019-08-25: 4 mg via INTRAVENOUS

## 2019-08-25 MED ORDER — CHLORHEXIDINE GLUCONATE CLOTH 2 % EX PADS: 6.0000 | MEDICATED_PAD | Freq: Every day | CUTANEOUS | Status: DC

## 2019-08-25 MED ORDER — VANCOMYCIN HCL IN DEXTROSE 1-5 GM/200ML-% IV SOLN
1000.0000 mg | Freq: Once | INTRAVENOUS | Status: AC
  Administered 2019-08-25: 1000 mg via INTRAVENOUS
  Filled 2019-08-25: qty 200

## 2019-08-25 MED ORDER — CHLORHEXIDINE GLUCONATE CLOTH 2 % EX PADS
6.0000 | MEDICATED_PAD | Freq: Every day | CUTANEOUS | Status: DC
  Administered 2019-08-25 – 2019-08-28 (×3): 6 via TOPICAL

## 2019-08-25 MED ORDER — EPHEDRINE SULFATE-NACL 50-0.9 MG/10ML-% IV SOSY
PREFILLED_SYRINGE | INTRAVENOUS | Status: DC | PRN
  Administered 2019-08-25: 10 mg via INTRAVENOUS

## 2019-08-25 MED ORDER — SUGAMMADEX SODIUM 200 MG/2ML IV SOLN
INTRAVENOUS | Status: DC | PRN
  Administered 2019-08-25: 200 mg via INTRAVENOUS

## 2019-08-25 MED ORDER — FENTANYL CITRATE (PF) 100 MCG/2ML IJ SOLN
INTRAMUSCULAR | Status: AC
  Filled 2019-08-25: qty 2

## 2019-08-25 MED ORDER — PANTOPRAZOLE SODIUM 40 MG IV SOLR
40.0000 mg | Freq: Two times a day (BID) | INTRAVENOUS | Status: DC
  Administered 2019-08-25 (×2): 40 mg via INTRAVENOUS
  Filled 2019-08-25 (×2): qty 40

## 2019-08-25 MED ORDER — SODIUM CHLORIDE 0.9 % IV SOLN: INTRAVENOUS | Status: DC

## 2019-08-25 MED ORDER — PROPOFOL 10 MG/ML IV BOLUS
INTRAVENOUS | Status: DC | PRN
  Administered 2019-08-25: 90 mg via INTRAVENOUS

## 2019-08-25 MED ORDER — GLUCAGON HCL RDNA (DIAGNOSTIC) 1 MG IJ SOLR
INTRAMUSCULAR | Status: AC
  Filled 2019-08-25: qty 1

## 2019-08-25 MED ORDER — LIDOCAINE 2% (20 MG/ML) 5 ML SYRINGE
INTRAMUSCULAR | Status: DC | PRN
  Administered 2019-08-25: 60 mg via INTRAVENOUS

## 2019-08-25 MED ORDER — FENTANYL CITRATE (PF) 250 MCG/5ML IJ SOLN
INTRAMUSCULAR | Status: DC | PRN
  Administered 2019-08-25 (×2): 50 ug via INTRAVENOUS

## 2019-08-25 MED ORDER — VANCOMYCIN HCL 750 MG/150ML IV SOLN: 750.0000 mg | INTRAVENOUS | Status: DC

## 2019-08-25 MED ORDER — PHENYLEPHRINE HCL-NACL 10-0.9 MG/250ML-% IV SOLN
INTRAVENOUS | Status: DC | PRN
Start: 2019-08-25 — End: 2019-08-25
  Administered 2019-08-25: 25 ug/min via INTRAVENOUS

## 2019-08-25 NOTE — Progress Notes (Signed)
NT reported patient is vomiting blood , patient seems like lethargic and response was delayed. RRN was called notified  Also on call provider Dr. Hal Hope made aware and came to the floor to  checked the patient after further assesement by Dr. Hal Hope he verbaly gave an order to transfer the patient to ICU. Patient was informed about the plan. Report was given to the ICU nurse Brantley.

## 2019-08-25 NOTE — Op Note (Addendum)
[facility] Patient Name: [name] Procedure Date: 08/25/2019 MRN: [id number] Attending MD: Jackquline Denmark , MD Date of Birth: 23-Jan-1936 CSN: [id number] Age: 84 Admit Type: Outpatient Procedure:                EGD followed by ERCP Indications:              Inoperable HOP AdenoCa s/p uncovered biliary stent                            Nov 2020, S/P SBRT, now with UGI bleeding,                            ascending cholangitis with increasing LFTs. Providers:                Jackquline Denmark, MD, Baird Cancer, RN, Theodora Blow,                            Technician Referring MD:              Medicines:                General Anesthesia Complications:            No immediate complications. Estimated Blood Loss:     Estimated blood loss was minimal. Procedure:                Pre-Anesthesia Assessment:                           - Prior to the procedure, a History and Physical                            was performed, and patient medications and                            allergies were reviewed. The patient's tolerance of                            previous anesthesia was also reviewed. The risks                            and benefits of the procedure and the sedation                            options and risks were discussed with the patient.                            All questions were answered, and informed consent                            was obtained. Prior Anticoagulants: The patient has                            taken no previous anticoagulant or antiplatelet  agents. ASA Grade Assessment: IV - A patient with                            severe systemic disease that is a constant threat                            to life. After reviewing the risks and benefits,                            the patient was deemed in satisfactory condition to                            undergo the procedure.                           After obtaining informed  consent, the scope was                            passed under direct vision. Throughout the                            procedure, the patient's blood pressure, pulse, and                            oxygen saturations were monitored continuously. The                            [id number]-Q180V ([id number]) Olympus Duodenoscope was                            introduced through the mouth, and used to inject                            contrast into and used to inject contrast into the                            bile duct. The [id number]-H190 ([id number]) Olympus                            gastroscope was introduced through the mouth, and                            used to inject contrast into. The ERCP was                            accomplished without difficulty. The patient                            tolerated the procedure well. Scope In: Scope Out: Findings:      EGD findings: The esophagus, stomach was normal. Fresh blood was noted       in the second portion of the duodenum. No duodenal ulcers or tumor       invasion. A metal stent was noted in the second  portion of the duodenum.      ERCP findings: A previously placed uncovered biliary stent was visible       on the scout film. The esophagus was successfully intubated under direct       vision. The scope was advanced to a normal major papilla in the       descending duodenum without detailed examination of the pharynx, larynx       and associated structures, and upper GI tract. The upper GI tract was       grossly normal. The bile duct was deeply cannulated with the short-nosed       traction sphincterotome through the existing stent. Contrast was       injected. I personally interpreted the bile duct images. Ductal flow of       contrast was adequate. Image quality was adequate.      The stent was partially occluded due to tumor ingrowth. The biliary tree       was swept with a 9-10 mm balloon starting at the proximal margin of the       stent several  times. This resulted in extraction of significant pus,       clots and debris.One 10 mm by 6 cm fully covered metal stent was placed       into the common bile duct through the existing stent with good results.       Bile flowed through the stent. The stent was in good position.      No bleeding towards the end of procedure.      Pancreatic duct was intentionally not cannulated. Impression:               -Tumor ingrowth into existing uncovered biliary                            stent s/p balloon sweep followed by overlapping 10                            mm x 6 cm fully covered biliary stent.                           -UGI bleeding d/t tumor ingrowth/progression.                           -Ascending cholangitis d/t stent occlusion. Moderate Sedation:      none Recommendation:           - Return patient to ICU for ongoing care.                           - Clear liquid diet in 4 hrs, then low-fat diet.                            Advance diet by a.m. as tolerated.                           - Watch for pancreatitis, bleeding, perforation,                            and cholangitis.                           -  Agree with broad-spectrum antibiotics. She would                            require antibiotics for 7 to 10 days.                           - If there is any further bleeding, recommend IR                            intervention with possible embolization.                           - Trend CBC, CMP.                           - Rectal indomethacin was intentionally not given                            d/t H/O significant UGI bleeding this morning                           - Can change to Protonix 40 mg p.o. QD once able to                            take p.o.                           - The findings and recommendations were discussed                            with the patient's daughter [name] [phone].                           - Will follow along. Procedure Code(s):        ---  Professional ---                           [phone], Endoscopic retrograde                            cholangiopancreatography (ERCP); with placement of                            endoscopic stent into biliary or pancreatic duct,                            including pre- and post-dilation and guide wire                            passage, when performed, including sphincterotomy,                            when performed, each stent                           [id number], Endoscopic retrograde  cholangiopancreatography (ERCP); with removal of                            calculi/debris from biliary/pancreatic duct(s)                           [phone], Endoscopic catheterization of the biliary                            ductal system, radiological supervision and                            interpretation Diagnosis Code(s):        --- Professional ---                           K83.1, Obstruction of bile duct                           C25.0, Malignant neoplasm of head of pancreas CPT copyright 2019 [employer]. All rights reserved. The codes documented in this report are preliminary and upon coder review may  be revised to meet current compliance requirements. Jackquline Denmark, MD 08/25/2019 3:22:19 PM This report has been signed electronically. Number of Addenda: 0

## 2019-08-25 NOTE — Anesthesia Postprocedure Evaluation (Signed)
Anesthesia Post Note  Patient: [name]  Procedure(s) Performed: ERCP with stent   (N/A ) ESOPHAGOGASTRODUODENOSCOPY (EGD) (N/A ) REMOVAL OF STONES BILIARY STENT PLACEMENT (N/A )     Patient location during evaluation: PACU Anesthesia Type: General Level of consciousness: awake and alert and oriented Pain management: pain level controlled Vital Signs Assessment: post-procedure vital signs reviewed and stable Respiratory status: spontaneous breathing, nonlabored ventilation and respiratory function stable Cardiovascular status: blood pressure returned to baseline Postop Assessment: no apparent nausea or vomiting Anesthetic complications: no   No complications documented.  Last Vitals:  Vitals:   08/25/19 1600 08/25/19 1612  BP: (!) 136/46   Pulse: 70 68  Resp: 18 15  Temp: (!) 36.4 C   SpO2: 100% 96%    Last Pain:  Vitals:   08/25/19 1600  TempSrc: Oral  PainSc: 0-No pain                 Brennan Bailey

## 2019-08-25 NOTE — Progress Notes (Signed)
Pt continues to refuse CPAP QHS, RN aware.  RT to monitor and assess as needed.

## 2019-08-25 NOTE — Progress Notes (Addendum)
Patient ID: [name], female   DOB: November 12, 1935, 84 y.o.   MRN: [id number]    Progress Note   Subjective   Day # 2  CC ; pancreatic cancer, acute severe upper abdominal pain, rectal bleeding  Patient transferred to the ICU early this morning.  She had an episode of hematemesis, fairly large amount per nursing, then had some lethargy and transient hypoxia.  She has stabilized in the ICU, O2 weaned to 2 L and maintaining sats in the 90s.  She has not had any further vomiting over the past couple of hours, no bowel movements.  She continues to complain of epigastric pain.  Febrile last night as well  WBC 9.0, hemoglobin 8.7 down from 10.7 on admit T bili 3.1/alk phos 256/ALT 338/AST 326 INR 1.1 Lactate 3.3 this a.m.  On Vanco and Zosyn   Objective   Vital signs in last 24 hours: Temp:  [97.2 F (36.2 C)-102.1 F (38.9 C)] 97.2 F (36.2 C) (06/30 0800) Pulse Rate:  [61-97] 97 (06/30 0439) Resp:  [10-22] 15 (06/30 0600) BP: (103-177)/(35-90) 103/39 (06/30 0600) SpO2:  [85 %-100 %] 90 % (06/30 0439) Weight:  [55.3 kg] 55.3 kg (06/29 1023) Last BM Date: 08/24/19 General: Very pleasant elderly   white female in NAD-family at bedside Heart:  Regular rate and rhythm; no murmurs Lungs: Respirations even and unlabored, lungs CTA bilaterally Abdomen:  Soft, tender across the epigastrium ,nondistended. Normal bowel sounds. Extremities:  Without edema. Neurologic:  Alert and oriented,  grossly normal neurologically. Psych:  Cooperative. Normal mood and affect.  Intake/Output from previous day: 06/29 0701 - 06/30 0700 In: 2068.4 [P.O.:620; I.V.:433.1; IV Piggyback:1015.4] Out: 1000 [Urine:800; Stool:200] Intake/Output this shift: No intake/output data recorded.  Lab Results: Recent Labs    08/23/19 1116 08/23/19 1116 08/24/19 1148 08/24/19 1915 08/25/19 0507  WBC 6.9  --  7.5  --  9.0  HGB 10.8*  --  10.7* 9.7* 8.7*  HCT 32.5*   < > 32.1* 29.5* 26.4*  PLT 274  --  272   --  277   < > = values in this interval not displayed.   BMET Recent Labs    08/23/19 1116 08/24/19 1148 08/25/19 0507  NA 134* 135 133*  K 4.6 4.2 4.3  CL 97* 96* 96*  CO2 '28 28 29  '$ GLUCOSE 123* 115* 184*  BUN '13 11 10  '$ CREATININE 0.82 0.58 0.69  CALCIUM 9.4 9.0 8.7*   LFT Recent Labs    08/25/19 0507  PROT 6.8  6.5  ALBUMIN 3.6  3.7  AST 326*  324*  ALT 338*  335*  ALKPHOS 256*  243*  BILITOT 3.1*  3.0*  BILIDIR 1.8*  IBILI 1.2*   PT/INR Recent Labs    08/25/19 0507  LABPROT 14.2  INR 1.1    Studies/Results: CT ABDOMEN PELVIS W CONTRAST  Result Date: 08/24/2019 CLINICAL DATA:  Epigastric abdominal pain. Blood in stool. Pancreatic adenocarcinoma. Remote history of breast cancer. EXAM: CT ABDOMEN AND PELVIS WITH CONTRAST TECHNIQUE: Multidetector CT imaging of the abdomen and pelvis was performed using the standard protocol following bolus administration of intravenous contrast. CONTRAST:  162m OMNIPAQUE IOHEXOL 300 MG/ML  SOLN COMPARISON:  08/03/2019 FINDINGS: Lower chest: Stable scarring or subsegmental atelectasis in the lung bases. Centrilobular emphysema. Old granulomatous disease. Descending thoracic aortic atherosclerotic calcification. Hepatobiliary: Pneumobilia. Biliary stent remains in place. No compelling findings of metastatic disease to the liver. No overt gallbladder wall thickening. Pancreas: Prominently dilated dorsal  pancreatic duct similar to prior, extending to an infiltrative mass in the head of the pancreas which surrounds the stent. Several fiducials remain in place. Overall the pancreatic mass does not appear significantly changed, measuring roughly 3.1 by 2.8 cm on image 26/2. Mild surrounding inflammatory stranding along the pancreatic head. Spleen: Unchanged with 3 small hypodense lesions which are probably benign but technically nonspecific. Adrenals/Urinary Tract: Unremarkable Stomach/Bowel: Indistinctness of tissue planes around the  pancreatic head and adjacent portions of the duodenum, pancreatitis and duodenitis not exclude and some of this may be related to prior radiation therapy. Overall the degree of stranding around the descending and transverse duodenum is similar to 08/03/2019 and no dilation of the stomach or bowel is observed. Sigmoid colon diverticulosis is present. No active diverticulitis identified. Vascular/Lymphatic: The pancreatic head mass narrows the confluence of the splenic vein and the SMV into the portal vein. Aortoiliac atherosclerotic vascular disease. Stable prominence of parametrial venous structures, left greater than right, pelvic congestion syndrome not excluded. Reproductive: Unremarkable Other: Low-grade stranding in the central mesentery, minimally increased from prior, nonspecific. Musculoskeletal: Degenerative arthropathy of both hips. Grade 1 degenerative anterolisthesis at L4-5. Multilevel lumbar spondylosis and degenerative disc disease with left greater than right foraminal impingement at L5-S1. central narrowing of the thecal sac at the L4-5 level. IMPRESSION: 1. Similar appearance of the infiltrative mass in the head of the pancreas with surrounding fiducials. The mass narrows the confluence of the splenic vein and the SMV into the portal vein. 2. Indistinctness of tissue planes around the pancreatic head and adjacent portions of the duodenum, pancreatitis and duodenitis could cause this appearance, and radiation therapy in the past may be predisposing. 3. Other imaging findings of potential clinical significance: Sigmoid colon diverticulosis. Stable prominence of parametrial venous structures, left greater than right, pelvic congestion syndrome not excluded. Multilevel lumbar spondylosis and degenerative disc disease causing left greater than right foraminal impingement at L5-S1. 4. Emphysema and aortic atherosclerosis. Aortic Atherosclerosis (ICD10-I70.0) and Emphysema (ICD10-J43.9). Electronically  Signed   By: Van Clines M.D.   On: 08/24/2019 14:58   DG CHEST PORT 1 VIEW  Result Date: 08/25/2019 CLINICAL DATA:  Fever this morning EXAM: PORTABLE CHEST 1 VIEW COMPARISON:  Abdominal CT from yesterday FINDINGS: Vague density over the right base is favored to reflect a skin fold given the discrete lateral appearance and hazy appearance. No opacity seen in this area on recent abdominal CT. Normal heart size and mediastinal contours. CABG. IMPRESSION: 1. Vague opacity at the right base favored to reflect a skin fold. No opacity in this area on CT from yesterday. 2. Otherwise unremarkable study. Electronically Signed   By: Monte Fantasia M.D.   On: 08/25/2019 07:06       Assessment / Plan:    #24 84 year old white female diagnosed with pancreatic cancer fall 2020 with biliary obstruction.  She has indwelling uncovered metal stent.  She completed palliative radiation February 2021 Presented yesterday with acute severe epigastric pain and dark red rectal bleeding.  Patient developed fever and chills yesterday, abrupt elevation of LFTs, no further bleeding yesterday but had an episode of medium to large volume hematemesis this morning and drop in hemoglobin 2 g since admit  Concerned that she may have erosion of her pancreatic tumor into the duodenum as source for bleeding and possibly also leading to occlusion of stent, and cholangitis. May also have had hemobilia with occlusion of stent.  #2 anemia-secondary to above #3 coronary artery disease status post CABG  Plan; continue Vanco and Zosyn, await blood cultures A second IV Start twice daily PPI IV Await stat hemoglobin suspect she will need transfusions today, every 6 hour hemoglobins Plan to proceed with EGD and ERCP early this afternoon with Dr. Lyndel Safe, further recommendations pending findings. Patient is currently a full code, this will need to be readdressed    Principal Problem:   Epigastric pain Active Problems:    Asthma   Breast cancer, Left   Atherosclerosis of coronary artery bypass graft of native heart with angina pectoris with documented spasm (HCC)   OSA on CPAP   Adenocarcinoma of head of pancreas (HCC)   Transaminitis   Elevated LFTs   Hematochezia     LOS: 1 day   Amy EsterwoodPA-C  08/25/2019, 9:11 AM    Attending physician's note   I have taken an interval history, reviewed the chart and examined the patient. I agree with the Advanced Practitioner's note, impression and recommendations.   For EGD and ERCP today. Explained risks and benefits including risks of pancreatitis, bleeding, perforation Plan to trend CBC.  CMP. Transfuse as needed Agree with broad-spectrum antibiotics.  Carmell Austria, MD Velora Heckler Fabienne Bruns [phone].

## 2019-08-25 NOTE — Progress Notes (Signed)
POST ERCP NOTE:  Pt doing well. No abdominal pain or nausea or melena. Vitals stable. Abdominal Exam: soft, nontender. Discussed with pt. Advance diet  [name]

## 2019-08-25 NOTE — Progress Notes (Signed)
Patient reported pain of 8/10 and was given IV morphine 2 mg,called after 25 minutes and complained of nausea upon assesement patient vomited and was given nausea medicine.   NT saw that patient is  bleeding on her nose and was assisted by RN and NT, check vital signs and noticed a drop of Oxygen level between 70's- 80's patient was placed on nasal cannula @ 7L, patient oxygen level slowly comes back up to 90's and oxygen level was tapered all the way down to 3L . Patient denies chest pain and shortness of breathing . We will continue to monitor.

## 2019-08-25 NOTE — TOC Initial Note (Signed)
Transition of Care [facility]) - Initial/Assessment Note    Patient Details  Name: [name] MRN: [id number] Date of Birth: 02/15/1936  [facility]) CM/SW Contact:    Leeroy Cha, RN Phone Number: 08/25/2019, 8:51 AM  Clinical Narrative:                 Discharge Readiness Return to top of Abdominal Pain, Undiagnosed RRG - [place]  Discharge readiness is indicated by patient meeting Recovery Milestones, including ALL of the following: ? Hemodynamic stability yes ? Pain absent or managed  no ? Etiology or finding requiring inpatient treatment absent   pending ? Ambulatory or acceptable for next level of care[N] not at this time ? Oral hydration[O] npo ? Oral medications or regimen acceptable for next level of care yes ? Oral diet or acceptable for next level of care ? Discharge plans and education understood   Not at this time  Expected Discharge Plan: Home/Self Care Barriers to Discharge: Continued Medical Work up   Patient Goals and CMS Choice Patient states their goals for this hospitalization and ongoing recovery are:: to go home CMS Medicare.gov Compare Post Acute Care list provided to:: Patient Choice offered to / list presented to : Patient  Expected Discharge Plan and Services Expected Discharge Plan: Home/Self Care   Discharge Planning Services: CM Consult   Living arrangements for the past 2 months: Single Family Home                                      Prior Living Arrangements/Services Living arrangements for the past 2 months: Single Family Home Lives with:: Spouse Patient language and need for interpreter reviewed:: Yes Do you feel safe going back to the place where you live?: Yes      Need for Family Participation in Patient Care: Yes (Comment) Care giver support system in place?: Yes (comment)   Criminal Activity/Legal Involvement Pertinent to Current Situation/Hospitalization: No - Comment as needed  Activities of Daily  Living Home Assistive Devices/Equipment: None ADL Screening (condition at time of admission) Patient's cognitive ability adequate to safely complete daily activities?: Yes Is the patient deaf or have difficulty hearing?: No Does the patient have difficulty seeing, even when wearing glasses/contacts?: No Does the patient have difficulty concentrating, remembering, or making decisions?: No Patient able to express need for assistance with ADLs?: No Does the patient have difficulty dressing or bathing?: No Independently performs ADLs?: Yes (appropriate for developmental age) Does the patient have difficulty walking or climbing stairs?: No Weakness of Legs: None Weakness of Arms/Hands: None  Permission Sought/Granted                  Emotional Assessment Appearance:: Appears stated age     Orientation: : Oriented to Self, Oriented to Place, Oriented to  Time, Oriented to Situation Alcohol / Substance Use: Not Applicable Psych Involvement: No (comment)  Admission diagnosis:  Epigastric pain [R10.13] Elevated LFTs [R79.89] History of pancreatic cancer [Z85.07] Patient Active Problem List   Diagnosis Date Noted  . Gastrointestinal hemorrhage 08/24/2019  . Elevated LFTs   . Hematochezia   . Transaminitis 12/16/2018  . Secondary diabetes ([place]) 12/03/2018  . Adenocarcinoma of head of pancreas ([place]) 11/27/2018  . Obstructive jaundice due to malignant neoplasm ([place]) 11/27/2018  . Itching 11/26/2018  . HTN (hypertension) 04/12/2018  . Lumbar radiculopathy 04/12/2018  . Degeneration of lumbar intervertebral disc  12/16/2017  . Spinal stenosis of lumbar region 12/16/2017  . Osteoarthritis of hip 09/25/2017  . Low back pain 09/25/2017  . S/P CABG (coronary artery bypass graft) 12/19/2016  . Epigastric pain 04/11/2016  . 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  . Heartburn 05/23/2015  . OSA on CPAP 11/23/2014  . Generalized anxiety disorder 02/04/2014  . Labile hypertension   .  Fibromyalgia   . Lumbar adjacent segment disease with spondylolisthesis 12/24/2013  . Atherosclerosis of coronary artery bypass graft of native heart with angina pectoris with documented spasm ([place]) 12/24/2013  . Bronchitis, chronic obstructive w acute bronchitis ([place]) 12/21/2013  . Diverticulosis of colon without hemorrhage 11/15/2013  . Hot flashes related to aromatase inhibitor therapy 07/27/2013  . Osteopenia 07/27/2013  . DJD (degenerative joint disease) 09/29/2012  . Breast cancer, Left 12/20/2010  . FATTY LIVER DISEASE 01/09/2010  . Allergic rhinitis 02/18/2007  . DIZZINESS, CHRONIC 02/18/2007  . Headache in back of head 02/18/2007  . Mixed hyperlipidemia 12/23/2006  . GLAUCOMA 12/23/2006  . Coronary atherosclerosis 12/23/2006  . Asthma 12/23/2006  . Gastroesophageal reflux disease 12/23/2006  . IRRITABLE BOWEL SYNDROME 12/23/2006  . Claudication ([place]) 12/23/2006   PCP:  Ronnald Nian, DO Pharmacy:   [facility] [phone] [place], [place] [address] [place] [address] Phone: [phone] Fax: [phone]     Social Determinants of Health (SDOH) Interventions    Readmission Risk Interventions No flowsheet data found.

## 2019-08-25 NOTE — Interval H&P Note (Signed)
History and Physical Interval Note:  08/25/2019 1:55 PM  [name]  has presented today for surgery, with the diagnosis of pancreatic CA  with stent, elevated LFT's , acute pain and bleeding.  The various methods of treatment have been discussed with the patient and family. After consideration of risks, benefits and other options for treatment, the patient has consented to  Procedure(s): ERCP with stent   (N/A) ESOPHAGOGASTRODUODENOSCOPY (EGD) (N/A) as a surgical intervention.  The patient's history has been reviewed, patient examined, no change in status, stable for surgery.  I have reviewed the patient's chart and labs.  Questions were answered to the patient's satisfaction.     Jackquline Denmark

## 2019-08-25 NOTE — Plan of Care (Signed)
Pt rec'd from floor after rapid response. Arrived via bed with primary RN and rapid RN on 3L [place], IV pole and vitals monitor. Pt assessed, attached to ICU monitoring equipment. Pt lethargic, but arouses to pain and able to intermittently answer orientation questions. Temp 102.1 oral on arrival. On call physician Dr. Hal Hope at bedside shortly after arrival to assess. VSS at this time. Will continue to monitor.

## 2019-08-25 NOTE — Progress Notes (Signed)
Pharmacy Antibiotic Note  [name] is a 84 y.o. female admitted on 08/24/2019 with sepsis.  Pharmacy has been consulted for vancomycin, zosyn dosing.  Plan: Start vancomycin 1gm iv x1, then 750mg  iv q24hr Vancomycin trough goal 15-20  Zosyn 3.375g IV Q8H infused over 4hrs.   Height: 4\' 10"  (147.3 cm) Weight: 55.3 kg (122 lb) IBW/kg (Calculated) : 40.9  Temp (24hrs), Avg:99.6 F (37.6 C), Min:98.1 F (36.7 C), Max:102.1 F (38.9 C)  Recent Labs  Lab 08/23/19 1116 08/24/19 1148 08/25/19 0507  WBC 6.9 7.5 9.0  CREATININE 0.82 0.58 0.69    Estimated Creatinine Clearance: 40.7 mL/min (by C-G formula based on SCr of 0.69 mg/dL).    Allergies  Allergen Reactions  . Levaquin [Levofloxacin In D5w]     Elevated BP  . Choline Fenofibrate Other (See Comments)     pt states INTOL to Trilipix w/ "thigh burning"  . Hctz [Hydrochlorothiazide]     Causes hyponatremia  . Simvastatin Other (See Comments)     pt states INTOL to STATINS \\T \ refuses to restart  . Adhesive [Tape] Rash  . Bentyl [Dicyclomine Hcl] Other (See Comments)    "Made me feel weird and drained me"  . Ceclor [Cefaclor] Rash  . Clarithromycin Rash  . Codeine Nausea Only  . Doxycycline Rash  . Hydrocodone     Nightmare after taking cough syrup w/hydrocodone  . Lisinopril Cough    Developed ACE cough...  . Penicillins Itching, Rash and Other (See Comments)    At injection site Did it involve swelling of the face/tongue/throat, SOB, or low BP? No Did it involve sudden or severe rash/hives, skin peeling, or any reaction on the inside of your mouth or nose? No Did you need to seek medical attention at a hospital or doctor's office? No When did it last happen?"It was a long time ago" If all above answers are "NO", may proceed with cephalosporin use.   . Tobramycin-Dexamethasone Rash    Antimicrobials this admission: Vancomycin 08/25/2019 >> Zosyn 08/24/2019 >>   Dose adjustments this  admission: -  Microbiology results: -  Thank you for allowing pharmacy to be a part of this patient's care.  Nani Skillern Crowford 08/25/2019 6:39 AM

## 2019-08-25 NOTE — Progress Notes (Signed)
PROGRESS NOTE    [name]  TKP:[id number] DOB: 1935/03/06 DOA: 08/24/2019 PCP: Ronnald Nian, DO    Brief Narrative:  Patient was admitted due to epigastric abdominal pain and elevation of liver enzymes.  84 year old female who presented with abdominal pain and nausea for 3 days. She does have significant past medical history for coronary artery disease status post bypass grafting, and breast cancer status post lumpectomy. Recently diagnosed with pancreatic adenocarcinoma. He had a pancreatic stent placed in 11/20. She reported severe epigastric abdominal pain, 10/10, radiating to her back, associated with nausea. On the day of admission she was seen by the outpatient GI clinic, because of her symptoms she was referred to the ED. On her initial physical examination blood pressure 154/70 heart rate 84, respiratory rate 20 oxygen saturation 96% her lungs are clear to auscultation bilaterally heart abdomen was soft tender in the epigastric area with no guarding or rebound. No lower extremity edema.  Sodium 135 potassium 4.1 chloride 96 bicarb 20 glucose 115, BUN 11, creatinine 0.5 lipase 14, AST 552, ALT 273, total bilirubin is 1.3, white count 7.5, hemoglobin 10.7, hematocrit 32.1, platelets 272. SARS COVID-19 negative. Urine analysis negative for infection. CT of the abdomen with contrast with mass in the head of the pancreas with surrounding fiducials. Chest radiograph no infiltrates. EKG 79 bpm normal axis, normal intervals, sinus rhythm no significant ST segment or T wave changes.  Patient developed fever and acute hypoxemia, along with lethargy. Patient was placed on antibiotic therapy and transferred to ICU.   Assessment & Plan:   Principal Problem:   Upper GI bleed Active Problems:   Asthma   Breast cancer, Left   Atherosclerosis of coronary artery bypass graft of native heart with angina pectoris with documented spasm (HCC)   OSA on CPAP   Epigastric pain   S/P CABG  (coronary artery bypass graft)   HTN (hypertension)   Adenocarcinoma of head of pancreas (HCC)   Elevated LFTs   1. Acute blood loss anemia due upper GI bleed.   Her hgb has been stable this am 8.7 to 8,3, but dropped from admission from 10,7. Last episode of hematemesis this am. Her blood pressure is 135 to 517 mmHg systolic.   Will continue pantoprazole IV 40 mg bid, IV fluids with isotonic saline, NPO and as needed antiemetics and analgesics. Case dicussed with GI Annia Friendly [place] PA) at the bedside, plan for endoscopic procedure today with possible ERCP.   Check H&H this pm, if less than 7.0 will order PRBC transfusion.   2. Adenocarcinoma of the pancreas sp uncovered metal biliary stent placement for severe malignant biliary stricture, complicated acute cholangitis/ sepsis (endorgan failure lactic acidosis/ transient acute hypoxic respiratory failure) present on admission. T max 102.1 this am, total bilirubin is elevated at 3,1 and patient has icterus. Persistent elevation of AST at 326 and ALT 338. Lactic acid is 3,3. Oxygenation this am is 100 on 2 L/ min per [place].   Will continue antibiotic therapy with IV Zosyn (dc vancomycin), continue isotonic fluids IV at 75 ml per H and follow up blood cultures. Patient will have ERCP during this admission, metallic biliary stent will need to be revised.   3. HTN. Continue to hold on antihypertensive medications (amlodipine, hydralazine and metoprolol) due to risk of hypotension.   4. CAD. No active chest pain. Holding on antiplatelet therapy.   5. Chronic anemia. Will check iron panel in am.   6. Asthma. No exacerbation, will continue  as needed albuterol.   Patient continue to be at high risk for worsening GI bleeding and sepsis   Status is: Inpatient  Remains inpatient appropriate because:IV treatments appropriate due to intensity of illness or inability to take PO   Dispo: The patient is from: Home              Anticipated d/c is to:  Home              Anticipated d/c date is: 3 days              Patient currently is not medically stable to d/c.   DVT prophylaxis: scd   Code Status:    full Family Communication:  I spoke with patient's daughter and husband at the bedside, we talked in detail about patient's condition, plan of care and prognosis and all questions were addressed.     Consultants:   GI   Procedures:     Antimicrobials:   IV zosyn     Subjective: Patient continue to have epigastric abdominal pain, today had moderate to large hematemesis, along with hypoxemia and change in mentation. This am more awake and alert.   Objective: Vitals:   08/24/19 2140 08/25/19 0439 08/25/19 0500 08/25/19 0600  BP:  (!) 156/56  (!) 103/39  Pulse: 94 97    Resp: 16 20  15   Temp:  99.7 F (37.6 C) (!) 102.1 F (38.9 C)   TempSrc:  Oral Oral   SpO2: 94% 90%    Weight:      Height:        Intake/Output Summary (Last 24 hours) at 08/25/2019 0900 Last data filed at 08/25/2019 0500 Gross per 24 hour  Intake 2068.4 ml  Output 1000 ml  Net 1068.4 ml   Filed Weights   08/24/19 1023  Weight: 55.3 kg    Examination:   General: deconditioned and ill looking appearing  Neurology: Awake and alert, non focal  E ENT: positive pallor, positive icterus, oral mucosa moist Cardiovascular: No JVD. S1-S2 present, rhythmic, no gallops, rubs, or murmurs. No lower extremity edema. Pulmonary: positive  breath sounds bilaterally, adequate air movement, no wheezing, rhonchi or rales. Gastrointestinal. Abdomen mild distended, tender to superficial and deep palpation at the epigastrium with no rebound or guarding Skin. No rashes Musculoskeletal: no joint deformities     Data Reviewed: I have personally reviewed following labs and imaging studies  CBC: Recent Labs  Lab 08/23/19 1116 08/24/19 1148 08/24/19 1915 08/25/19 0507  WBC 6.9 7.5  --  9.0  NEUTROABS 3.9  --   --  6.6  HGB 10.8* 10.7* 9.7* 8.7*  HCT  32.5* 32.1* 29.5* 26.4*  MCV 92.3 92.0  --  93.3  PLT 274 272  --  409   Basic Metabolic Panel: Recent Labs  Lab 08/23/19 1116 08/24/19 1148 08/25/19 0507  NA 134* 135 133*  K 4.6 4.2 4.3  CL 97* 96* 96*  CO2 28 28 29   GLUCOSE 123* 115* 184*  BUN 13 11 10   CREATININE 0.82 0.58 0.69  CALCIUM 9.4 9.0 8.7*   GFR: Estimated Creatinine Clearance: 40.7 mL/min (by C-G formula based on SCr of 0.69 mg/dL). Liver Function Tests: Recent Labs  Lab 08/23/19 1116 08/24/19 1148 08/25/19 0507  AST 20 552* 326*  324*  ALT 24 273* 338*  335*  ALKPHOS 108 154* 256*  243*  BILITOT 0.8 1.3* 3.1*  3.0*  PROT 7.4 7.5 6.8  6.5  ALBUMIN 3.8 4.1  3.6  3.7   Recent Labs  Lab 08/24/19 1148  LIPASE 14   No results for input(s): AMMONIA in the last 168 hours. Coagulation Profile: Recent Labs  Lab 08/25/19 0507  INR 1.1   Cardiac Enzymes: No results for input(s): CKTOTAL, CKMB, CKMBINDEX, TROPONINI in the last 168 hours. BNP (last 3 results) No results for input(s): PROBNP in the last 8760 hours. HbA1C: No results for input(s): HGBA1C in the last 72 hours. CBG: Recent Labs  Lab 08/25/19 0520  GLUCAP 158*   Lipid Profile: No results for input(s): CHOL, HDL, LDLCALC, TRIG, CHOLHDL, LDLDIRECT in the last 72 hours. Thyroid Function Tests: No results for input(s): TSH, T4TOTAL, FREET4, T3FREE, THYROIDAB in the last 72 hours. Anemia Panel: No results for input(s): VITAMINB12, FOLATE, FERRITIN, TIBC, IRON, RETICCTPCT in the last 72 hours.    Radiology Studies: I have reviewed all of the imaging during this hospital visit personally     Scheduled Meds: . amLODipine  5 mg Oral QPM  . Chlorhexidine Gluconate Cloth  6 each Topical Daily  . hydrALAZINE  25 mg Oral TID  . latanoprost  1 drop Both Eyes QHS  . mouth rinse  15 mL Mouth Rinse BID  . metoprolol tartrate  50 mg Oral BID  . pantoprazole  40 mg Oral BID  . sucralfate  1 g Oral BID  . vitamin B-12  1,000 mcg Oral  Daily   Continuous Infusions: . sodium chloride 50 mL/hr at 08/25/19 0732  . piperacillin-tazobactam    . piperacillin-tazobactam (ZOSYN)  IV 12.5 mL/hr at 08/25/19 0500  . [START ON 08/26/2019] vancomycin       LOS: 1 day        Anelis Hrivnak Gerome Apley, MD

## 2019-08-25 NOTE — Progress Notes (Signed)
Called patients husband and daughter about patient being transferred to ICU.

## 2019-08-25 NOTE — Transfer of Care (Signed)
Immediate Anesthesia Transfer of Care Note  Patient: SAMYUKTA CURA  Procedure(s) Performed: ERCP with stent   (N/A ) ESOPHAGOGASTRODUODENOSCOPY (EGD) (N/A ) REMOVAL OF STONES BILIARY STENT PLACEMENT (N/A )  Patient Location: Endoscopy Unit  Anesthesia Type:General  Level of Consciousness: awake, alert , oriented and patient cooperative  Airway & Oxygen Therapy: Patient Spontanous Breathing and Patient connected to face mask oxygen  Post-op Assessment: Report given to RN, Post -op Vital signs reviewed and stable and Patient moving all extremities  Post vital signs: Reviewed and stable  Last Vitals:  Vitals Value Taken Time  BP 144/37 08/25/19 1510  Temp 36.6 C 08/25/19 1513  Pulse 74 08/25/19 1515  Resp 16 08/25/19 1515  SpO2 92 % 08/25/19 1515  Vitals shown include unvalidated device data.  Last Pain:  Vitals:   08/25/19 1513  TempSrc: Oral  PainSc: 0-No pain      Patients Stated Pain Goal: 0 (87/21/58 [id number])  Complications: No complications documented.

## 2019-08-25 NOTE — Anesthesia Procedure Notes (Signed)
Procedure Name: Intubation Date/Time: 08/25/2019 2:05 PM Performed by: Mitzie Na, CRNA Pre-anesthesia Checklist: Patient identified, Emergency Drugs available, Suction available and Patient being monitored Patient Re-evaluated:Patient Re-evaluated prior to induction Oxygen Delivery Method: Circle system utilized Preoxygenation: Pre-oxygenation with 100% oxygen Induction Type: IV induction Ventilation: Mask ventilation without difficulty Laryngoscope Size: Mac and 3 Grade View: Grade I Tube type: Oral Tube size: 7.0 mm Number of attempts: 1 Airway Equipment and Method: Stylet and Oral airway Placement Confirmation: ETT inserted through vocal cords under direct vision,  positive ETCO2 and breath sounds checked- equal and bilateral Secured at: 23 cm Tube secured with: Tape Dental Injury: Teeth and Oropharynx as per pre-operative assessment

## 2019-08-25 NOTE — Anesthesia Preprocedure Evaluation (Addendum)
Anesthesia Evaluation  Patient identified by MRN, date of birth, ID band Patient awake    Reviewed: Allergy & Precautions, NPO status , Patient's Chart, lab work & pertinent test results, reviewed documented beta blocker date and time   History of Anesthesia Complications Negative for: history of anesthetic complications  Airway Mallampati: II  TM Distance: >3 FB Neck ROM: Full    Dental  (+) Missing,    Pulmonary asthma , sleep apnea , COPD,    Pulmonary exam normal        Cardiovascular hypertension, Pt. on medications and Pt. on home beta blockers + CAD and + CABG (2011)  Normal cardiovascular exam  TTE 2015: EF 60-65%, valves ok   Neuro/Psych Anxiety negative neurological ROS     GI/Hepatic Neg liver ROS, hiatal hernia, GERD  Medicated and Controlled,Pancreatic ca   Endo/Other  diabetes  Renal/GU negative Renal ROS  negative genitourinary   Musculoskeletal  (+) Arthritis , Fibromyalgia -  Abdominal   Peds  Hematology  (+) anemia , Hgb 8.3   Anesthesia Other Findings Hx of breast ca s/p lumpectomy  Reproductive/Obstetrics negative OB ROS                            Anesthesia Physical Anesthesia Plan  ASA: III  Anesthesia Plan: General   Post-op Pain Management:    Induction: Intravenous  PONV Risk Score and Plan: 3 and Treatment may vary due to age or medical condition, Ondansetron and Dexamethasone  Airway Management Planned: Oral ETT  Additional Equipment: None  Intra-op Plan:   Post-operative Plan: Extubation in OR  Informed Consent: I have reviewed the patients History and Physical, chart, labs and discussed the procedure including the risks, benefits and alternatives for the proposed anesthesia with the patient or authorized representative who has indicated his/her understanding and acceptance.     Dental advisory given  Plan Discussed with: CRNA  Anesthesia  Plan Comments:        Anesthesia Quick Evaluation

## 2019-08-25 NOTE — Significant Event (Signed)
Rapid Response Event Note   Overview:  Called d/t pt vomiting blood   Initial Focused Assessment:  Found pt to be oriented to name and place.  Follow simple commands.  Breath sounds clear and decreased.   Abd soft Pt on 3 L [place]  Interventions: Pt placed on monitor VSS. Labs collected per MD orders.    Plan of Care (if not transferred): Pt to be transferred to [facility] per MD orders for closer monitoring                                                                                                                                                                                                                                                                                      Garnet Koyanagi, Hezzie Bump

## 2019-08-26 ENCOUNTER — Encounter (HOSPITAL_COMMUNITY): Payer: Self-pay | Admitting: Gastroenterology

## 2019-08-26 DIAGNOSIS — Z8507 Personal history of malignant neoplasm of pancreas: Secondary | ICD-10-CM

## 2019-08-26 DIAGNOSIS — K922 Gastrointestinal hemorrhage, unspecified: Secondary | ICD-10-CM

## 2019-08-26 LAB — CBC
HCT: 22 % — ABNORMAL LOW (ref 36.0–46.0)
Hemoglobin: 7.1 g/dL — ABNORMAL LOW (ref 12.0–15.0)
MCH: 30.2 pg (ref 26.0–34.0)
MCHC: 32.3 g/dL (ref 30.0–36.0)
MCV: 93.6 fL (ref 80.0–100.0)
Platelets: 206 10*3/uL (ref 150–400)
RBC: 2.35 MIL/uL — ABNORMAL LOW (ref 3.87–5.11)
RDW: 14.8 % (ref 11.5–15.5)
WBC: 10.2 10*3/uL (ref 4.0–10.5)
nRBC: 0 % (ref 0.0–0.2)

## 2019-08-26 LAB — BASIC METABOLIC PANEL
Anion gap: 7 (ref 5–15)
BUN: 11 mg/dL (ref 8–23)
CO2: 25 mmol/L (ref 22–32)
Calcium: 8.4 mg/dL — ABNORMAL LOW (ref 8.9–10.3)
Chloride: 98 mmol/L (ref 98–111)
Creatinine, Ser: 0.56 mg/dL (ref 0.44–1.00)
GFR calc Af Amer: >60 mL/min (ref >60)
GFR calc non Af Amer: >60 mL/min (ref >60)
Glucose, Bld: 230 mg/dL — ABNORMAL HIGH (ref 70–99)
Potassium: 3.7 mmol/L (ref 3.5–5.1)
Sodium: 130 mmol/L — ABNORMAL LOW (ref 135–145)

## 2019-08-26 LAB — URINALYSIS, ROUTINE W REFLEX MICROSCOPIC
Bacteria, UA: NONE SEEN
Bilirubin Urine: NEGATIVE
Glucose, UA: >=500 mg/dL — AB
Ketones, ur: NEGATIVE mg/dL
Leukocytes,Ua: NEGATIVE
Nitrite: NEGATIVE
Protein, ur: NEGATIVE mg/dL
Specific Gravity, Urine: 1.015 (ref 1.005–1.030)
pH: 5 (ref 5.0–8.0)

## 2019-08-26 LAB — PREPARE RBC (CROSSMATCH)

## 2019-08-26 MED ORDER — SODIUM CHLORIDE 0.9% IV SOLUTION: Freq: Once | INTRAVENOUS | Status: AC

## 2019-08-26 MED ORDER — ACETAMINOPHEN 325 MG PO TABS
650.0000 mg | ORAL_TABLET | Freq: Four times a day (QID) | ORAL | Status: DC | PRN
  Administered 2019-08-26 – 2019-08-27 (×4): 650 mg via ORAL
  Filled 2019-08-26 (×4): qty 2

## 2019-08-26 MED ORDER — PANTOPRAZOLE SODIUM 40 MG PO TBEC
40.0000 mg | DELAYED_RELEASE_TABLET | Freq: Every day | ORAL | Status: DC
  Administered 2019-08-26 – 2019-08-28 (×3): 40 mg via ORAL
  Filled 2019-08-26 (×3): qty 1

## 2019-08-26 MED ORDER — POLYVINYL ALCOHOL 1.4 % OP SOLN
1.0000 [drp] | OPHTHALMIC | Status: DC | PRN
  Administered 2019-08-26: 1 [drp] via OPHTHALMIC
  Filled 2019-08-26: qty 15

## 2019-08-26 NOTE — Progress Notes (Signed)
Progress Note  CC:    Abdominal pain       ASSESSMENT AND PLAN:   # pancreatic cancer with recurrent biliary obstruction / cholangitiis secondary to tumor ingrowth into existing stent --s/p ERCP yesterday with removal of debris / pus from existing stent and placement of overlapping stent.  Bile flowed through stent.  --Repeat LFTs tomorrow.  --Continue broad spectrum antibiotics for additional 7-10 days. --Can advance to low fat diet at any time.    # GI bleeding with hematemesis / rectal bleeding --secondary to tumor ingrowth / disease progression into existing stent.     SUBJECTIVE   Abdominal pain much better, just mild "spasm" after eating. Tolerating     OBJECTIVE:     Vital signs in last 24 hours: Temp:  [96.5 F (35.8 C)-97.9 F (36.6 C)] 97.1 F (36.2 C) (07/01 1200) Pulse Rate:  [37-97] 73 (07/01 1200) Resp:  [0-25] 20 (07/01 1200) BP: (116-192)/(37-82) 149/66 (07/01 1200) SpO2:  [79 %-100 %] 100 % (07/01 1200) Last BM Date: 08/26/19 General:   Alert, in NAD Heart:  Regular rate and rhythm.  No lower extremity edema   Pulm: Normal respiratory effort   Abdomen:  Soft,  Mild RUQ tenderness,  nondistended.  Normal bowel sounds.          Neurologic:  Alert and  oriented,  grossly normal neurologically. Psych:  Pleasant, cooperative.  Normal mood and affect.   Intake/Output from previous day: 06/30 0701 - 07/01 0700 In: 8416 [P.O.:240; I.V.:1131.5; IV Piggyback:289.5] Out: 750 [Urine:750] Intake/Output this shift: Total I/O In: 311.7 [I.V.:275.6; IV Piggyback:36.2] Out: -   Lab Results: Recent Labs    08/25/19 0507 08/25/19 0507 08/25/19 0926 08/25/19 1619 08/26/19 0758  WBC 9.0  --  10.1  --  10.2  HGB 8.7*   < > 8.3* 7.2* 7.1*  HCT 26.4*   < > 25.1* 22.4* 22.0*  PLT 277  --  201  --  206   < > = values in this interval not displayed.   BMET Recent Labs    08/24/19 1148 08/25/19 0507 08/26/19 0128  NA 135 133* 130*  K 4.2 4.3 3.7    CL 96* 96* 98  CO2 28 29 25   GLUCOSE 115* 184* 230*  BUN 11 10 11   CREATININE 0.58 0.69 0.56  CALCIUM 9.0 8.7* 8.4*   LFT Recent Labs    08/25/19 0507  PROT 6.8  6.5  ALBUMIN 3.6  3.7  AST 326*  324*  ALT 338*  335*  ALKPHOS 256*  243*  BILITOT 3.1*  3.0*  BILIDIR 1.8*  IBILI 1.2*   PT/INR Recent Labs    08/25/19 0507  LABPROT 14.2  INR 1.1   Hepatitis Panel Recent Labs    08/24/19 1321  HEPBSAG NON REACTIVE  HCVAB NON REACTIVE  HEPAIGM NON REACTIVE  HEPBIGM NON REACTIVE    CT ABDOMEN PELVIS W CONTRAST  Result Date: 08/24/2019 CLINICAL DATA:  Epigastric abdominal pain. Blood in stool. Pancreatic adenocarcinoma. Remote history of breast cancer. EXAM: CT ABDOMEN AND PELVIS WITH CONTRAST TECHNIQUE: Multidetector CT imaging of the abdomen and pelvis was performed using the standard protocol following bolus administration of intravenous contrast. CONTRAST:  155mL OMNIPAQUE IOHEXOL 300 MG/ML  SOLN COMPARISON:  08/03/2019 FINDINGS: Lower chest: Stable scarring or subsegmental atelectasis in the lung bases. Centrilobular emphysema. Old granulomatous disease. Descending thoracic aortic atherosclerotic calcification. Hepatobiliary: Pneumobilia. Biliary stent remains in place. No compelling findings of metastatic disease  to the liver. No overt gallbladder wall thickening. Pancreas: Prominently dilated dorsal pancreatic duct similar to prior, extending to an infiltrative mass in the head of the pancreas which surrounds the stent. Several fiducials remain in place. Overall the pancreatic mass does not appear significantly changed, measuring roughly 3.1 by 2.8 cm on image 26/2. Mild surrounding inflammatory stranding along the pancreatic head. Spleen: Unchanged with 3 small hypodense lesions which are probably benign but technically nonspecific. Adrenals/Urinary Tract: Unremarkable Stomach/Bowel: Indistinctness of tissue planes around the pancreatic head and adjacent portions of the  duodenum, pancreatitis and duodenitis not exclude and some of this may be related to prior radiation therapy. Overall the degree of stranding around the descending and transverse duodenum is similar to 08/03/2019 and no dilation of the stomach or bowel is observed. Sigmoid colon diverticulosis is present. No active diverticulitis identified. Vascular/Lymphatic: The pancreatic head mass narrows the confluence of the splenic vein and the SMV into the portal vein. Aortoiliac atherosclerotic vascular disease. Stable prominence of parametrial venous structures, left greater than right, pelvic congestion syndrome not excluded. Reproductive: Unremarkable Other: Low-grade stranding in the central mesentery, minimally increased from prior, nonspecific. Musculoskeletal: Degenerative arthropathy of both hips. Grade 1 degenerative anterolisthesis at L4-5. Multilevel lumbar spondylosis and degenerative disc disease with left greater than right foraminal impingement at L5-S1. central narrowing of the thecal sac at the L4-5 level. IMPRESSION: 1. Similar appearance of the infiltrative mass in the head of the pancreas with surrounding fiducials. The mass narrows the confluence of the splenic vein and the SMV into the portal vein. 2. Indistinctness of tissue planes around the pancreatic head and adjacent portions of the duodenum, pancreatitis and duodenitis could cause this appearance, and radiation therapy in the past may be predisposing. 3. Other imaging findings of potential clinical significance: Sigmoid colon diverticulosis. Stable prominence of parametrial venous structures, left greater than right, pelvic congestion syndrome not excluded. Multilevel lumbar spondylosis and degenerative disc disease causing left greater than right foraminal impingement at L5-S1. 4. Emphysema and aortic atherosclerosis. Aortic Atherosclerosis (ICD10-I70.0) and Emphysema (ICD10-J43.9). Electronically Signed   By: Van Clines M.D.   On:  08/24/2019 14:58   DG CHEST PORT 1 VIEW  Result Date: 08/25/2019 CLINICAL DATA:  Fever this morning EXAM: PORTABLE CHEST 1 VIEW COMPARISON:  Abdominal CT from yesterday FINDINGS: Vague density over the right base is favored to reflect a skin fold given the discrete lateral appearance and hazy appearance. No opacity seen in this area on recent abdominal CT. Normal heart size and mediastinal contours. CABG. IMPRESSION: 1. Vague opacity at the right base favored to reflect a skin fold. No opacity in this area on CT from yesterday. 2. Otherwise unremarkable study. Electronically Signed   By: Monte Fantasia M.D.   On: 08/25/2019 07:06   DG ERCP  Result Date: 08/25/2019 CLINICAL DATA:  ERCP with stent placement. EXAM: ERCP TECHNIQUE: Multiple spot images obtained with the fluoroscopic device and submitted for interpretation post-procedure. FLUOROSCOPY TIME:  Fluoroscopy Time:  2 minutes and 56 seconds Number of Acquired Spot Images: 26 COMPARISON:  CT dated August 24, 2019 FINDINGS: Initial images demonstrate a metallic common bile duct stent. Subsequent images demonstrate cannulation of the common bile duct with injection of contrast. There is some mild intrahepatic biliary ductal dilatation. There is minimal contrast flowing through the stent. Subsequent images demonstrate placement of an additional metallic stent through the pre-existing stent. IMPRESSION: ERCP with stent placement as above. These images were submitted for radiologic interpretation only. Please see  the procedural report for the amount of contrast and the fluoroscopy time utilized. Electronically Signed   By: Constance Holster M.D.   On: 08/25/2019 16:19      Principal Problem:   Upper GI bleed Active Problems:   Asthma   Breast cancer, Left   Atherosclerosis of coronary artery bypass graft of native heart with angina pectoris with documented spasm (HCC)   OSA on CPAP   Epigastric pain   S/P CABG (coronary artery bypass graft)    HTN (hypertension)   Adenocarcinoma of head of pancreas (HCC)   Elevated LFTs     LOS: 2 days   Tye Savoy ,NP 08/26/2019, 1:23 PM

## 2019-08-26 NOTE — Progress Notes (Signed)
PIV consult: Discussed need for second PIV site with RN. Suggested transfusing blood after antibiotic complete. Pt has not received any other IV meds in past 24 hours. RN will re-enter consult if pt's needs change.

## 2019-08-26 NOTE — Progress Notes (Signed)
PROGRESS NOTE    [name]  OJJ:[id number] DOB: Oct 09, 1935 DOA: 08/24/2019 PCP: Ronnald Nian, DO    Brief Narrative:  Patient was admitted due to epigastric abdominal pain and elevation of liver enzymes, complicated with upper GI bleed due to tumor ingrowth/ progression into the biliary stent.   84 year old female who presented with abdominal pain and nausea for 3 days. She does have significant past medical history for coronary artery disease status post bypass grafting, and breast cancer status post lumpectomy. Recently diagnosed with pancreatic adenocarcinoma. He had a pancreatic stent placed in 11/20. She reported severe epigastric abdominal pain, 10/10, radiating to her back, associated with nausea. On the day of admission she was seen by the outpatient GI clinic, because of her symptoms she was referred to the ED. On her initial physical examination blood pressure 154/70 heart rate 84, respiratory rate 20 oxygen saturation 96% her lungs are clear to auscultation bilaterally heart abdomen was soft tender in the epigastric area with no guarding or rebound. No lower extremity edema.  Sodium 135 potassium 4.1 chloride 96 bicarb 20 glucose 115, BUN 11, creatinine 0.5 lipase 14, AST 552, ALT 273, total bilirubin is 1.3, white count 7.5, hemoglobin 10.7, hematocrit 32.1, platelets 272. SARS COVID-19 negative. Urine analysis negative for infection. CT of the abdomen with contrast with mass in the head of the pancreas with surrounding fiducials. Chest radiograph no infiltrates. EKG 79 bpm normal axis, normal intervals, sinus rhythm no significant ST segment or T wave changes.  Patient developed fever and acute hypoxemia, along with lethargy. Patient was placed on antibiotic therapy and transferred to ICU.   She developed hematemesis with acute blood loss anemia.   06/30 Patient underwent ERCP finding tumor ingrowth into the biliary stent, producing bleeding and ascending cholangitis.  Patient had a balloon sweeping and placed a overlapping stent.    Assessment & Plan:   Principal Problem:   Upper GI bleed Active Problems:   Asthma   Breast cancer, Left   Atherosclerosis of coronary artery bypass graft of native heart with angina pectoris with documented spasm (HCC)   OSA on CPAP   Epigastric pain   S/P CABG (coronary artery bypass graft)   HTN (hypertension)   Adenocarcinoma of head of pancreas (HCC)   Elevated LFTs   1. Acute blood loss anemia due upper GI bleed/ tumor ingrowth into the biliary stent.  Patient with drooping Hgb down to 8,3 then 7,2 and ths am at 7,1. Blood pressure systolic 937 mmHg and HR 97 bpm.   Will transfuse 2 units PRBC today and continue close H&H monitoring. Continue pantoprazole po. Continue with full liquid diet for now. Hold on IV fluids for now.   Out of bed to chair TID with meals.   2. Adenocarcinoma of the pancreas sp uncovered metal biliary stent placement for severe malignant biliary stricture ([id number]/[id number]), complicated with acute cholangitis/ sepsis (endorgan failure lactic acidosis/ transient acute hypoxic respiratory failure) present on admission. Patient is sp balloon sweeping and new stent placement. Patient has been afebrile over last 24 H. Cultures continue with no growth.   Continue antibiotic therapy with IV Zosyn and discontinue IV fluids. Patient will need 10 days antibiotic regimen per GI recommendations.   Will check LFT in am.   3. HTN. Blood pressure stable, target systolic blood pressure 938 to 180 in the setting of acute upper GI bleed due to risk of hypotension. Continue to hold on antihypertensive medications (amlodipine, hydralazine and metoprolol) due to  risk of hypotension.   4. CAD. No chest pain, hold on asa.   5. Chronic anemia with iron deficiency. Iron panel with serum iron of 16, tibc 261, transferrin saturation 6 and ferritin 334, with transferring 187.  Will plan for IV iron on this admission,  once sepsis more resolved. Continue to monitor H&H, keep Hgb above 7.0   6. Asthma. No signs of exacerbation,on as needed albuterol   Patient continue to be at high risk for recurrent bleeding and anemia/ worsening sepsis.   Status is: Inpatient  Remains inpatient appropriate because:IV treatments appropriate due to intensity of illness or inability to take PO   Dispo: The patient is from: Home              Anticipated d/c is to: Home              Anticipated d/c date is: 3 days              Patient currently is not medically stable to d/c.   DVT prophylaxis: scd   Code Status:    full  Family Communication:  No family at the bedside       Consultants:   GI   Procedures:   ERCP / balloon sweeping and new biliary stent placement,.   Antimicrobials:   IV Zosyn,     Subjective: Patient continue to be very weak and deconditioned, able to tolerate full liquid this am, she continue to have melanotic stools, moderate in intensity, no dizziness or lightheadedness. No nausea or vomiting.   Objective: Vitals:   08/26/19 0500 08/26/19 0600 08/26/19 0700 08/26/19 0800  BP: (!) 135/47 (!) 128/52 (!) 140/54 (!) 171/82  Pulse: 75 71 78 97  Resp: (!) 0 (!) 9 12 14   Temp:    (!) 96.5 F (35.8 C)  TempSrc:    Axillary  SpO2: 100% 100% 100% 91%  Weight:      Height:        Intake/Output Summary (Last 24 hours) at 08/26/2019 0841 Last data filed at 08/26/2019 0800 Gross per 24 hour  Intake 1420.37 ml  Output 750 ml  Net 670.37 ml   Filed Weights   08/24/19 1023  Weight: 55.3 kg    Examination:   General: deconditioned and ill looking appearing  Neurology: Awake and alert, non focal  E ENT: positive pallor, improved icterus, oral mucosa moist Cardiovascular: No JVD. S1-S2 present, rhythmic, no gallops, rubs, or murmurs. No lower extremity edema. Pulmonary: positive breath sounds bilaterally, adequate air movement, no wheezing, rhonchi or rales. Gastrointestinal.  Abdomen soft with no organomegaly, non tender, no rebound or guarding Skin. No rashes Musculoskeletal: no joint deformities     Data Reviewed: I have personally reviewed following labs and imaging studies  CBC: Recent Labs  Lab 08/23/19 1116 08/23/19 1116 08/24/19 1148 08/24/19 1148 08/24/19 1915 08/25/19 0507 08/25/19 0926 08/25/19 1619 08/26/19 0758  WBC 6.9  --  7.5  --   --  9.0 10.1  --  10.2  NEUTROABS 3.9  --   --   --   --  6.6  --   --   --   HGB 10.8*  --  10.7*   < > 9.7* 8.7* 8.3* 7.2* 7.1*  HCT 32.5*   < > 32.1*   < > 29.5* 26.4* 25.1* 22.4* 22.0*  MCV 92.3  --  92.0  --   --  93.3 93.0  --  93.6  PLT 274  --  272  --   --  277 201  --  206   < > = values in this interval not displayed.   Basic Metabolic Panel: Recent Labs  Lab 08/23/19 1116 08/24/19 1148 08/25/19 0507 08/26/19 0128  NA 134* 135 133* 130*  K 4.6 4.2 4.3 3.7  CL 97* 96* 96* 98  CO2 28 28 29 25   GLUCOSE 123* 115* 184* 230*  BUN 13 11 10 11   CREATININE 0.82 0.58 0.69 0.56  CALCIUM 9.4 9.0 8.7* 8.4*   GFR: Estimated Creatinine Clearance: 40.7 mL/min (by C-G formula based on SCr of 0.56 mg/dL). Liver Function Tests: Recent Labs  Lab 08/23/19 1116 08/24/19 1148 08/25/19 0507  AST 20 552* 326*  324*  ALT 24 273* 338*  335*  ALKPHOS 108 154* 256*  243*  BILITOT 0.8 1.3* 3.1*  3.0*  PROT 7.4 7.5 6.8  6.5  ALBUMIN 3.8 4.1 3.6  3.7   Recent Labs  Lab 08/24/19 1148  LIPASE 14   No results for input(s): AMMONIA in the last 168 hours. Coagulation Profile: Recent Labs  Lab 08/25/19 0507  INR 1.1   Cardiac Enzymes: No results for input(s): CKTOTAL, CKMB, CKMBINDEX, TROPONINI in the last 168 hours. BNP (last 3 results) No results for input(s): PROBNP in the last 8760 hours. HbA1C: No results for input(s): HGBA1C in the last 72 hours. CBG: Recent Labs  Lab 08/25/19 0520 08/25/19 1346  GLUCAP 158* 128*   Lipid Profile: No results for input(s): CHOL, HDL, LDLCALC,  TRIG, CHOLHDL, LDLDIRECT in the last 72 hours. Thyroid Function Tests: No results for input(s): TSH, T4TOTAL, FREET4, T3FREE, THYROIDAB in the last 72 hours. Anemia Panel: Recent Labs    08/25/19 1619  FERRITIN 334*  TIBC 261  IRON 16*      Radiology Studies: I have reviewed all of the imaging during this hospital visit personally     Scheduled Meds: . Chlorhexidine Gluconate Cloth  6 each Topical Daily  . latanoprost  1 drop Both Eyes QHS  . mouth rinse  15 mL Mouth Rinse BID  . pantoprazole (PROTONIX) IV  40 mg Intravenous Q12H  . vitamin B-12  1,000 mcg Oral Daily   Continuous Infusions: . sodium chloride 75 mL/hr at 08/26/19 0800  . piperacillin-tazobactam    . piperacillin-tazobactam (ZOSYN)  IV Stopped (08/26/19 9242)     LOS: 2 days        Olufemi Mofield Gerome Apley, MD

## 2019-08-26 NOTE — Progress Notes (Signed)
Pt refused CPAP qhs. Pt encouraged to contact RT if she changed her mind.

## 2019-08-27 ENCOUNTER — Telehealth: Payer: Self-pay | Admitting: *Deleted

## 2019-08-27 ENCOUNTER — Telehealth: Payer: Self-pay

## 2019-08-27 LAB — BPAM RBC
Blood Product Expiration Date: 202107062359
Blood Product Expiration Date: 202107142359
ISSUE DATE / TIME: 202107011524
ISSUE DATE / TIME: 202107011758
Unit Type and Rh: 6200
Unit Type and Rh: 6200

## 2019-08-27 LAB — COMPREHENSIVE METABOLIC PANEL
ALT: 129 U/L — ABNORMAL HIGH (ref 0–44)
AST: 41 U/L (ref 15–41)
Albumin: 3.2 g/dL — ABNORMAL LOW (ref 3.5–5.0)
Alkaline Phosphatase: 164 U/L — ABNORMAL HIGH (ref 38–126)
Anion gap: 12 (ref 5–15)
BUN: 14 mg/dL (ref 8–23)
CO2: 26 mmol/L (ref 22–32)
Calcium: 8.6 mg/dL — ABNORMAL LOW (ref 8.9–10.3)
Chloride: 99 mmol/L (ref 98–111)
Creatinine, Ser: 0.53 mg/dL (ref 0.44–1.00)
GFR calc Af Amer: >60 mL/min (ref >60)
GFR calc non Af Amer: >60 mL/min (ref >60)
Glucose, Bld: 119 mg/dL — ABNORMAL HIGH (ref 70–99)
Potassium: 3.7 mmol/L (ref 3.5–5.1)
Sodium: 137 mmol/L (ref 135–145)
Total Bilirubin: 3.2 mg/dL — ABNORMAL HIGH (ref 0.3–1.2)
Total Protein: 6.4 g/dL — ABNORMAL LOW (ref 6.5–8.1)

## 2019-08-27 LAB — TYPE AND SCREEN
ABO/RH(D): A POS
Antibody Screen: NEGATIVE
Unit division: 0
Unit division: 0

## 2019-08-27 LAB — CBC WITH DIFFERENTIAL/PLATELET
Abs Immature Granulocytes: 0.06 10*3/uL (ref 0.00–0.07)
Basophils Absolute: 0 10*3/uL (ref 0.0–0.1)
Basophils Relative: 0 %
Eosinophils Absolute: 0 10*3/uL (ref 0.0–0.5)
Eosinophils Relative: 0 %
HCT: 30 % — ABNORMAL LOW (ref 36.0–46.0)
Hemoglobin: 10.2 g/dL — ABNORMAL LOW (ref 12.0–15.0)
Immature Granulocytes: 1 %
Lymphocytes Relative: 14 %
Lymphs Abs: 1.3 10*3/uL (ref 0.7–4.0)
MCH: 30.1 pg (ref 26.0–34.0)
MCHC: 34 g/dL (ref 30.0–36.0)
MCV: 88.5 fL (ref 80.0–100.0)
Monocytes Absolute: 0.6 10*3/uL (ref 0.1–1.0)
Monocytes Relative: 7 %
Neutro Abs: 7.1 10*3/uL (ref 1.7–7.7)
Neutrophils Relative %: 78 %
Platelets: 186 10*3/uL (ref 150–400)
RBC: 3.39 MIL/uL — ABNORMAL LOW (ref 3.87–5.11)
RDW: 14.9 % (ref 11.5–15.5)
WBC: 9.1 10*3/uL (ref 4.0–10.5)
nRBC: 0 % (ref 0.0–0.2)

## 2019-08-27 MED ORDER — ACETAMINOPHEN 325 MG PO TABS
650.0000 mg | ORAL_TABLET | Freq: Four times a day (QID) | ORAL | Status: DC
  Administered 2019-08-27 – 2019-08-28 (×2): 650 mg via ORAL
  Filled 2019-08-27 (×3): qty 2

## 2019-08-27 MED ORDER — AMLODIPINE BESYLATE 5 MG PO TABS
5.0000 mg | ORAL_TABLET | Freq: Every day | ORAL | Status: DC
  Administered 2019-08-27 – 2019-08-28 (×2): 5 mg via ORAL
  Filled 2019-08-27 (×2): qty 1

## 2019-08-27 MED ORDER — SALINE SPRAY 0.65 % NA SOLN
1.0000 | NASAL | Status: DC | PRN
  Filled 2019-08-27: qty 44

## 2019-08-27 NOTE — Progress Notes (Signed)
Pt. Offered CPAP device which patient refuses.  Will be available if patient changes her mind regarding usage.

## 2019-08-27 NOTE — Progress Notes (Signed)
Progress Note  CC:    Abdominal pain  / GI bleeding         ASSESSMENT AND PLAN:   # pancreatic cancer with recurrent biliary obstruction / cholangitiis secondary to tumor ingrowth into existing stent --s/p ERCP this admission with removal of debris / pus from existing stent and placement of overlapping stent with bile flow through stent.  --LFTs significantly improved  --Continue broad spectrum antibiotics for additional 7-10 days. --Will advance to low fat diet    # GI bleeding with hematemesis / rectal bleeding --No further hematemesis. Still some blood in stool ( fresh and old as patient describes it) --Bleeding secondary to tumor ingrowth / disease progression into existing stent.   # ABL anemia --Hgb up from 7 to 10 after 2 [facility] yesterday.        SUBJECTIVE    Mild postprandial mid upper abdominal pain. Has had two stools with dark and more fresh appearing blood since I saw her yesterday.    OBJECTIVE:     Vital signs in last 24 hours: Temp:  [97.1 F (36.2 C)-98.1 F (36.7 C)] 98 F (36.7 C) (07/02 0506) Pulse Rate:  [65-90] 68 (07/02 0506) Resp:  [12-25] 16 (07/02 0506) BP: (144-192)/(56-76) 166/68 (07/02 0506) SpO2:  [91 %-100 %] 91 % (07/02 0506) Last BM Date: 08/26/19 General:   Alert, in NAD Heart:  Regular rate and rhythm.  No lower extremity edema   Pulm: Normal respiratory effort   Abdomen:  Soft,  nontender, nondistended.  Normal bowel sounds.          Neurologic:  Alert and  oriented,  grossly normal neurologically. Psych:  Pleasant, cooperative.  Normal mood and affect.   Intake/Output from previous day: 07/01 0701 - 07/02 0700 In: 1616.7 [I.V.:552.2; Blood:936; IV Piggyback:128.5] Out: -  Intake/Output this shift: No intake/output data recorded.  Lab Results: Recent Labs    08/25/19 0926 08/25/19 0926 08/25/19 1619 08/26/19 0758 08/27/19 0312  WBC 10.1  --   --  10.2 9.1  HGB 8.3*   < > 7.2* 7.1* 10.2*  HCT 25.1*   < >  22.4* 22.0* 30.0*  PLT 201  --   --  206 186   < > = values in this interval not displayed.   BMET Recent Labs    08/25/19 0507 08/26/19 0128 08/27/19 0312  NA 133* 130* 137  K 4.3 3.7 3.7  CL 96* 98 99  CO2 29 25 26   GLUCOSE 184* 230* 119*  BUN 10 11 14   CREATININE 0.69 0.56 0.53  CALCIUM 8.7* 8.4* 8.6*   LFT Recent Labs    08/25/19 0507 08/25/19 0507 08/27/19 0312  PROT 6.8  6.5   < > 6.4*  ALBUMIN 3.6  3.7   < > 3.2*  AST 326*  324*   < > 41  ALT 338*  335*   < > 129*  ALKPHOS 256*  243*   < > 164*  BILITOT 3.1*  3.0*   < > 3.2*  BILIDIR 1.8*  --   --   IBILI 1.2*  --   --    < > = values in this interval not displayed.   PT/INR Recent Labs    08/25/19 0507  LABPROT 14.2  INR 1.1   Hepatitis Panel Recent Labs    08/24/19 1321  HEPBSAG NON REACTIVE  HCVAB NON REACTIVE  HEPAIGM NON REACTIVE  HEPBIGM NON REACTIVE    DG ERCP  Result Date: 08/25/2019 CLINICAL DATA:  ERCP with stent placement. EXAM: ERCP TECHNIQUE: Multiple spot images obtained with the fluoroscopic device and submitted for interpretation post-procedure. FLUOROSCOPY TIME:  Fluoroscopy Time:  2 minutes and 56 seconds Number of Acquired Spot Images: 26 COMPARISON:  CT dated August 24, 2019 FINDINGS: Initial images demonstrate a metallic common bile duct stent. Subsequent images demonstrate cannulation of the common bile duct with injection of contrast. There is some mild intrahepatic biliary ductal dilatation. There is minimal contrast flowing through the stent. Subsequent images demonstrate placement of an additional metallic stent through the pre-existing stent. IMPRESSION: ERCP with stent placement as above. These images were submitted for radiologic interpretation only. Please see the procedural report for the amount of contrast and the fluoroscopy time utilized. Electronically Signed   By: Constance Holster M.D.   On: 08/25/2019 16:19    Principal Problem:   Upper GI bleed Active  Problems:   Asthma   Breast cancer, Left   Atherosclerosis of coronary artery bypass graft of native heart with angina pectoris with documented spasm (HCC)   OSA on CPAP   Epigastric pain   S/P CABG (coronary artery bypass graft)   HTN (hypertension)   Adenocarcinoma of head of pancreas (HCC)   Elevated LFTs   History of pancreatic cancer     LOS: 3 days   [name] ,NP 08/27/2019, 9:44 AM

## 2019-08-27 NOTE — Telephone Encounter (Signed)
Patient called to tell her Dr. Sondra Come states it is okay to have an xray of her back per her previous call on 08/20/19. Pt's daughter answered the phone and reports she is currently is an inpatient at [facility] as her stent in her pancreas was infected and the tumor has grown. Pt's daughter advised Dr. Sondra Come will be updated on this.

## 2019-08-27 NOTE — Care Management Important Message (Signed)
Important Message  Patient Details IM Letter presented to the Patient Name: [name] MRN: [id number] Date of Birth: 09-01-1935   Medicare Important Message Given:  Yes     Kerin Salen 08/27/2019, 9:51 AM

## 2019-08-27 NOTE — Progress Notes (Signed)
Pharmacy Antibiotic Note  [name] is a 84 y.o. female admitted on 08/24/2019 with sepsis.  Pharmacy originally consulted for vancomycin, zosyn dosing.  Vanc stopped, pt  Still on zosyn.  08/27/2019 Scr 0.53 stable WBC 9.1  Plan: Continue Zosyn 3.375g IV Q8H infused over 4hrs, thru 7/3 Pharmacy will sign off   Height: 4\' 10"  (147.3 cm) Weight: 55.3 kg (122 lb) IBW/kg (Calculated) : 40.9  Temp (24hrs), Avg:97.8 F (36.6 C), Min:97.1 F (36.2 C), Max:98.1 F (36.7 C)  Recent Labs  Lab 08/23/19 1116 08/24/19 1148 08/25/19 0507 08/25/19 0926 08/26/19 0128 08/26/19 0758 08/27/19 0312  WBC 6.9 7.5 9.0 10.1  --  10.2 9.1  CREATININE 0.82 0.58 0.69  --  0.56  --  0.53  LATICACIDVEN  --   --  3.3* 2.5*  --   --   --     Estimated Creatinine Clearance: 40.7 mL/min (by C-G formula based on SCr of 0.53 mg/dL).    Allergies  Allergen Reactions  . Choline Fenofibrate Other (See Comments)     pt states INTOL to Trilipix w/ "thigh burning"  . Hctz [Hydrochlorothiazide]     Causes hyponatremia  . Simvastatin Other (See Comments)     pt states INTOL to STATINS \\T \ refuses to restart  . Levaquin [Levofloxacin In D5w]     Elevated BP  . Adhesive [Tape] Rash  . Bentyl [Dicyclomine Hcl] Other (See Comments)    "Made me feel weird and drained me"  . Ceclor [Cefaclor] Rash  . Clarithromycin Rash  . Codeine Nausea Only  . Doxycycline Rash  . Hydrocodone     Nightmare after taking cough syrup w/hydrocodone  . Lisinopril Cough    Developed ACE cough...  . Penicillins Itching, Rash and Other (See Comments)    At injection site Did it involve swelling of the face/tongue/throat, SOB, or low BP? No Did it involve sudden or severe rash/hives, skin peeling, or any reaction on the inside of your mouth or nose? No Did you need to seek medical attention at a hospital or doctor's office? No When did it last happen?"It was a long time ago" If all above answers are "NO", may proceed  with cephalosporin use.   . Tobramycin-Dexamethasone Rash    Antimicrobials this admission: Vancomycin 08/25/2019 >> 6/30 Zosyn 08/24/2019 >>   Dose adjustments this admission: -  Microbiology results: -  Thank you for allowing pharmacy to be a part of this patient's care.  Dolly Rias RPh 08/27/2019, 9:27 AM

## 2019-08-27 NOTE — Progress Notes (Signed)
PROGRESS NOTE    [name]  NLZ:[id number] DOB: Mar 07, 1935 DOA: 08/24/2019 PCP: Ronnald Nian, DO    Brief Narrative:  Patient was admitted due to epigastric abdominal pain and elevation of liver enzymes, complicated with upper GI bleed due to tumor ingrowth/ progression into the biliary stent.   84 year old female who presented with abdominal pain and nausea for 3 days. She does have significant past medical history for coronary artery disease status post bypass grafting,andbreast cancer status postlumpectomy.Recently diagnosed with pancreatic adenocarcinoma. He had a pancreatic stent placedin 11/20.She reported severe epigastric abdominal pain, 10/10, radiating to her back, associated with nausea. On the day of admission she was seen by the outpatient GI clinic, because of her symptoms she was referred to the ED. On her initial physical examination blood pressure 154/70 heart rate 84, respiratory rate 20 oxygen saturation 96% her lungs are clear to auscultation bilaterally heart abdomen was soft tender in the epigastric area with no guarding or rebound. No lower extremity edema. Sodium 135 potassium 4.1 chloride 96 bicarb 20 glucose 115, BUN 11, creatinine 0.5 lipase 14, AST 552, ALT 273, total bilirubin is 1.3, white count 7.5, hemoglobin 10.7, hematocrit 32.1, platelets 272. SARS COVID-19 negative. Urine analysis negative for infection. CT of the abdomen with contrastwith mass in the head of the pancreas with surroundingfiducials.Chest radiograph no infiltrates. EKG 79 bpm normal axis, normal intervals, sinus rhythm no significant ST segment or T wave changes.  Patient developed fever and acute hypoxemia, along with lethargy. Patient was placed on antibiotic therapy and transferred to ICU.  She developed hematemesis with acute blood loss anemia.   06/30 Patient underwent ERCP finding tumor ingrowth into the biliary stent, producing bleeding and ascending  cholangitis. Patient had a balloon sweeping and placed a overlapping stent.     Assessment & Plan:   Principal Problem:   Upper GI bleed Active Problems:   Asthma   Breast cancer, Left   Atherosclerosis of coronary artery bypass graft of native heart with angina pectoris with documented spasm (HCC)   OSA on CPAP   Epigastric pain   S/P CABG (coronary artery bypass graft)   HTN (hypertension)   Adenocarcinoma of head of pancreas (HCC)   Elevated LFTs   History of pancreatic cancer   1. Acute blood loss anemia due upper GI bleed/ tumor ingrowth into the biliary stent. sp 2 units PRBC transfusion, Hgb is 10.2 and Hct at 30.0. She continue to have melanotic stools.   Continue proton pump inhibitor with daily pantoprazole, will advance diet to soft and continue  Monitoring Hgb and Hct   Out of bed to chair TID with meals, consult PT and OT. Ok to discontinue telemetry.   2. Adenocarcinoma of the pancreas sp uncovered metal biliary stent placement for severe malignant biliary stricture (90/2409), complicated with acute cholangitis/ sepsis (endorgan failure lactic acidosis/ transient acute hypoxic respiratory failure) present on admission.Patient is sp balloon sweeping and new stent placement.   AST is 41, ALT 129, improved, stable T Bil at 3.2, but improved icterus.   Continue with IV Zosyn while hospitalized, and will transition to po antibiotic therapy at discharge, total 10 days.   Patient continue to have epigastric abdominal pain, moderate in intensity, will continue pain control with scheduled acetaminophen, as needed tramadol and for sever pain IV morphine.   3. HTN. At home patient on amlodipine, hydralazine and metoprolol) due to risk of hypotension. Her blood pressure has been 140 to 735 mmHg systolic, will  resume amlodipine for now and continue monitoring.   4. CAD. Continue holding on asa.   5. Chronic anemia with iron deficiency. Iron panel with serum iron of 16,  tibc 261, transferrin saturation 6 and ferritin 334, with transferring 187.  Will proceed and order one dose of IV iron, follow iron stores as outpatient,   6. Asthma. No clinical signs of acute exacerbation. On PRN albuterol.   Patient continue to be at high risk for worsening abdominal pain or recurrent bleeding.   Status is: Inpatient  Remains inpatient appropriate because:Inpatient level of care appropriate due to severity of illness   Dispo: The patient is from: Home              Anticipated d/c is to: Home              Anticipated d/c date is: 2 days              Patient currently is not medically stable to d/c.    DVT prophylaxis: scd   Code Status:   full  Family Communication:  I spoke with patient's daughter  at the bedside, we talked in detail about patient's condition, plan of care and prognosis and all questions were addressed.      Consultants:   GI   Procedures:   ERCP stent placement   Antimicrobials:   IV Zosyn     Subjective: Patient continue to have abdominal pain, moderate in intensity, epigastric improved with analgesics, no associated with nausea or vomiting, continue to have melena.   Objective: Vitals:   08/26/19 2050 08/26/19 2200 08/27/19 0119 08/27/19 0506  BP: (!) 189/76 (!) 145/68 (!) 163/58 (!) 166/68  Pulse: 79 65 68 68  Resp: 16  16 16   Temp: 98 F (36.7 C)  98 F (36.7 C) 98 F (36.7 C)  TempSrc:      SpO2: 95%  94% 91%  Weight:      Height:        Intake/Output Summary (Last 24 hours) at 08/27/2019 1348 Last data filed at 08/27/2019 0960 Gross per 24 hour  Intake 1658.95 ml  Output --  Net 1658.95 ml   Filed Weights   08/24/19 1023  Weight: 55.3 kg    Examination:   General: Not in pain or dyspnea, deconditioned and ill looking appearing  Neurology: Awake and alert, non focal  E ENT: positive pallor,  Improved icterus, oral mucosa moist Cardiovascular: No JVD. S1-S2 present, rhythmic, no gallops, rubs, or  murmurs. No lower extremity edema. Pulmonary: positive breath sounds bilaterally, adequate air movement, no wheezing, rhonchi or rales. Gastrointestinal. Abdomen flat, no organomegaly, non tender, no rebound or guarding Skin. No rashes Musculoskeletal: no joint deformities     Data Reviewed: I have personally reviewed following labs and imaging studies  CBC: Recent Labs  Lab 08/23/19 1116 08/23/19 1116 08/24/19 1148 08/24/19 1915 08/25/19 0507 08/25/19 0926 08/25/19 1619 08/26/19 0758 08/27/19 0312  WBC 6.9  --  7.5  --  9.0 10.1  --  10.2 9.1  NEUTROABS 3.9  --   --   --  6.6  --   --   --  7.1  HGB 10.8*  --  10.7*   < > 8.7* 8.3* 7.2* 7.1* 10.2*  HCT 32.5*   < > 32.1*   < > 26.4* 25.1* 22.4* 22.0* 30.0*  MCV 92.3   < > 92.0  --  93.3 93.0  --  93.6 88.5  PLT 274  --  272  --  277 201  --  206 186   < > = values in this interval not displayed.   Basic Metabolic Panel: Recent Labs  Lab 08/23/19 1116 08/24/19 1148 08/25/19 0507 08/26/19 0128 08/27/19 0312  NA 134* 135 133* 130* 137  K 4.6 4.2 4.3 3.7 3.7  CL 97* 96* 96* 98 99  CO2 28 28 29 25 26   GLUCOSE 123* 115* 184* 230* 119*  BUN 13 11 10 11 14   CREATININE 0.82 0.58 0.69 0.56 0.53  CALCIUM 9.4 9.0 8.7* 8.4* 8.6*   GFR: Estimated Creatinine Clearance: 40.7 mL/min (by C-G formula based on SCr of 0.53 mg/dL). Liver Function Tests: Recent Labs  Lab 08/23/19 1116 08/24/19 1148 08/25/19 0507 08/27/19 0312  AST 20 552* 326*  324* 41  ALT 24 273* 338*  335* 129*  ALKPHOS 108 154* 256*  243* 164*  BILITOT 0.8 1.3* 3.1*  3.0* 3.2*  PROT 7.4 7.5 6.8  6.5 6.4*  ALBUMIN 3.8 4.1 3.6  3.7 3.2*   Recent Labs  Lab 08/24/19 1148  LIPASE 14   No results for input(s): AMMONIA in the last 168 hours. Coagulation Profile: Recent Labs  Lab 08/25/19 0507  INR 1.1   Cardiac Enzymes: No results for input(s): CKTOTAL, CKMB, CKMBINDEX, TROPONINI in the last 168 hours. BNP (last 3 results) No results for  input(s): PROBNP in the last 8760 hours. HbA1C: No results for input(s): HGBA1C in the last 72 hours. CBG: Recent Labs  Lab 08/25/19 0520 08/25/19 1346  GLUCAP 158* 128*   Lipid Profile: No results for input(s): CHOL, HDL, LDLCALC, TRIG, CHOLHDL, LDLDIRECT in the last 72 hours. Thyroid Function Tests: No results for input(s): TSH, T4TOTAL, FREET4, T3FREE, THYROIDAB in the last 72 hours. Anemia Panel: Recent Labs    08/25/19 1619  FERRITIN 334*  TIBC 261  IRON 16*      Radiology Studies: I have reviewed all of the imaging during this hospital visit personally     Scheduled Meds: . Chlorhexidine Gluconate Cloth  6 each Topical Daily  . latanoprost  1 drop Both Eyes QHS  . mouth rinse  15 mL Mouth Rinse BID  . pantoprazole  40 mg Oral Daily  . vitamin B-12  1,000 mcg Oral Daily   Continuous Infusions: . piperacillin-tazobactam    . piperacillin-tazobactam (ZOSYN)  IV 3.375 g (08/27/19 0534)     LOS: 3 days        Deijah Spikes Gerome Apley, MD

## 2019-08-27 NOTE — Telephone Encounter (Signed)
TCT pt's daughter, [name].  Spoke with her. She is with her mom as we speak. Pt remains hospitalized but is doing better. They are hoping she will go home tomorrow.  [name] aware of f/u appt with Dr. Lorenso Courier on 09/01/19.  [name] nad pt expressed gratitude for call.

## 2019-08-28 DIAGNOSIS — Z9989 Dependence on other enabling machines and devices: Secondary | ICD-10-CM

## 2019-08-28 DIAGNOSIS — G4733 Obstructive sleep apnea (adult) (pediatric): Secondary | ICD-10-CM

## 2019-08-28 LAB — CBC WITH DIFFERENTIAL/PLATELET
Abs Immature Granulocytes: 0.06 10*3/uL (ref 0.00–0.07)
Basophils Absolute: 0 10*3/uL (ref 0.0–0.1)
Basophils Relative: 1 %
Eosinophils Absolute: 0.2 10*3/uL (ref 0.0–0.5)
Eosinophils Relative: 4 %
HCT: 33.9 % — ABNORMAL LOW (ref 36.0–46.0)
Hemoglobin: 11.3 g/dL — ABNORMAL LOW (ref 12.0–15.0)
Immature Granulocytes: 1 %
Lymphocytes Relative: 26 %
Lymphs Abs: 1.5 10*3/uL (ref 0.7–4.0)
MCH: 30.1 pg (ref 26.0–34.0)
MCHC: 33.3 g/dL (ref 30.0–36.0)
MCV: 90.4 fL (ref 80.0–100.0)
Monocytes Absolute: 0.6 10*3/uL (ref 0.1–1.0)
Monocytes Relative: 11 %
Neutro Abs: 3.5 10*3/uL (ref 1.7–7.7)
Neutrophils Relative %: 57 %
Platelets: 202 10*3/uL (ref 150–400)
RBC: 3.75 MIL/uL — ABNORMAL LOW (ref 3.87–5.11)
RDW: 15.1 % (ref 11.5–15.5)
WBC: 6 10*3/uL (ref 4.0–10.5)
nRBC: 0 % (ref 0.0–0.2)

## 2019-08-28 LAB — COMPREHENSIVE METABOLIC PANEL
ALT: 85 U/L — ABNORMAL HIGH (ref 0–44)
AST: 24 U/L (ref 15–41)
Albumin: 3.2 g/dL — ABNORMAL LOW (ref 3.5–5.0)
Alkaline Phosphatase: 141 U/L — ABNORMAL HIGH (ref 38–126)
Anion gap: 8 (ref 5–15)
BUN: 12 mg/dL (ref 8–23)
CO2: 29 mmol/L (ref 22–32)
Calcium: 8.6 mg/dL — ABNORMAL LOW (ref 8.9–10.3)
Chloride: 98 mmol/L (ref 98–111)
Creatinine, Ser: 0.53 mg/dL (ref 0.44–1.00)
GFR calc Af Amer: >60 mL/min (ref >60)
GFR calc non Af Amer: >60 mL/min (ref >60)
Glucose, Bld: 99 mg/dL (ref 70–99)
Potassium: 3.5 mmol/L (ref 3.5–5.1)
Sodium: 135 mmol/L (ref 135–145)
Total Bilirubin: 1.5 mg/dL — ABNORMAL HIGH (ref 0.3–1.2)
Total Protein: 6.2 g/dL — ABNORMAL LOW (ref 6.5–8.1)

## 2019-08-28 MED ORDER — TRAMADOL HCL 50 MG PO TABS: 50.0000 mg | ORAL_TABLET | Freq: Four times a day (QID) | ORAL | 0 refills | Status: DC | PRN

## 2019-08-28 MED ORDER — AMOXICILLIN-POT CLAVULANATE 875-125 MG PO TABS
1.0000 | ORAL_TABLET | Freq: Two times a day (BID) | ORAL | Status: DC
  Filled 2019-08-28: qty 1

## 2019-08-28 MED ORDER — PANTOPRAZOLE SODIUM 40 MG PO TBEC: 40.0000 mg | DELAYED_RELEASE_TABLET | Freq: Every day | ORAL | 0 refills | Status: DC

## 2019-08-28 MED ORDER — AMOXICILLIN-POT CLAVULANATE 875-125 MG PO TABS: 1.0000 | ORAL_TABLET | Freq: Two times a day (BID) | ORAL | 0 refills | Status: AC

## 2019-08-28 NOTE — Discharge Summary (Signed)
Physician Discharge Summary  [name] XKP:[id number] DOB: 1935-05-14 DOA: 08/24/2019  PCP: Ronnald Nian, DO  Admit date: 08/24/2019 Discharge date: 08/28/2019  Admitted From: Home  Disposition:  Home   Recommendations for Outpatient Follow-up and new medication changes:  1. Follow up with Dr. Bryan Lemma in 7 days.  2. Continue antibiotic therapy with Augmentin  3. Pain control with acetaminophen and tramadol as needed. 4. Discontinued aspirin.   Home Health: no   Equipment/Devices: no    Discharge Condition: stable  CODE STATUS: full  Diet recommendation: soft and advance as tolerated.   Brief/Interim Summary: Patient was admitted to the hospital due to upper GI bleed due to tumor ingrowth/ progression into the biliary stent/ complicated with cholangitis  84 year old female who presented with abdominal pain and nausea for 3 days. She does have significant past medical history for coronary artery disease status post bypass grafting,andbreast cancer status postlumpectomy.Recently diagnosed with pancreatic adenocarcinoma. She had a pancreatic stent placedon 11/20. At home she reported severe epigastric abdominal pain, 10/10, radiating to her back, associated with nausea. On the day of admission she was seen by the outpatient GI clinic, because of her persistent and worsening symptoms and she was referred to the ED for further evaluation. On her initial physical examination blood pressure 154/70 heart rate 84, respiratory rate 20 oxygen saturation 96%, she had pallor and icterus, her lungs were clear to auscultation bilaterally, heart S1 and S2 present and rhythmic, her abdomen was soft but tender in the epigastric area with no guarding or rebound. No lower extremity edema.  Sodium 135 potassium 4.1 chloride 96 bicarb 20 glucose 115, BUN 11, creatinine 0.5 lipase 14, AST 552, ALT 273, total bilirubin is 1.3, white count 7.5, hemoglobin 10.7, hematocrit 32.1, platelets  272. SARS COVID-19 negative. Urine analysis negative for infection. CT of the abdomen with contrastwith mass in the head of the pancreas with surroundingfiducials.Chest radiograph no infiltrates. EKG 79 bpm normal axis, normal intervals, sinus rhythm no significant ST segment or T wave changes.  Patient developed fever and acute hypoxemia, along with lethargy. Patient was placed on antibiotic therapy and transferred to ICU.  She developed hematemesis with acute blood loss anemia.   06/30 Patient underwent ERCP finding tumor ingrowth into the biliary stent, producing bleeding and ascending cholangitis. Patient had a balloon sweeping and placed a overlapping stent.  1. Acute blood loss anemia due to upper GI bleed, tumor ingrowth into the biliary stent. Patient received 2 units packed red blood cells obliteration. Patient was placed on intravenous pantoprazole and kept nothing by mouth. Patient underwent ERCP, source of bleeding at that prior biliary stent. Her stent was exchanged with no further signs of bleeding. Her discharge hemoglobin is 11.3, hematocrit 33.9 and platelets 202.  2. Adenocarcinoma of the pancreas status post uncovered metal biliary stent placement for severe malignant biliary stricture, on November 8675, now complicated with acute cholangitis/sepsis (endorgan failure lactic acidosis in transit hypoxic respiratory failure) present on admission. Patient was transferred to the stepdown unit for close monitoring, she received intravenous fluids and broad-spectrum antibiotic therapy with IV Zosyn.  She underwent ERCP, and she was found to have ingrowth tumor into the prior biliary stent producing bleeding and ascending cholangitis. Patient had balloon sweeping and had an overlapping stent placed.  Her blood cultures remain no growth, she had improvement on her liver profile, discharge alkaline phosphatase 141, AST 24, ALT 85, total bilirubin is 1.5. Patient will continue  antibiotic therapy with Augmentin for 7  more days. Her diet has been advanced to soft with good toleration.  3. Hypertension. Initially her antihypertensive agents were held, progressively amlodipine was reintroduced, at home will continue with her antihypertensive regimen with hydralazine, metoprolol, losartan along with amlodipine.   4. Coronary artery disease. No active chest pain, aspirin has been discontinued.  5. Chronic anemia with iron deficiency. Iron stores showed a serum iron of 16, TIBC 261, transferrin saturation 6 and ferritin 334 with transferrin 187.  Patient received 1 dose of IV iron while hospitalized, follow-up iron panel as an outpatient.  6. Asthma. No signs of acute exacerbation, continue albuterol as needed.   Discharge Diagnoses:  Principal Problem:   Upper GI bleed Active Problems:   Asthma   Breast cancer, Left   Atherosclerosis of coronary artery bypass graft of native heart with angina pectoris with documented spasm (HCC)   OSA on CPAP   Epigastric pain   S/P CABG (coronary artery bypass graft)   HTN (hypertension)   Adenocarcinoma of head of pancreas (HCC)   Elevated LFTs   History of pancreatic cancer    Discharge Instructions   Allergies as of 08/28/2019      Reactions   Choline Fenofibrate Other (See Comments)    pt states INTOL to Trilipix w/ "thigh burning"   Hctz [hydrochlorothiazide]    Causes hyponatremia   Simvastatin Other (See Comments)    pt states INTOL to STATINS \\T \ refuses to restart   Levaquin [levofloxacin In D5w]    Elevated BP   Adhesive [tape] Rash   Bentyl [dicyclomine Hcl] Other (See Comments)   "Made me feel weird and drained me"   Ceclor [cefaclor] Rash   Clarithromycin Rash   Codeine Nausea Only   Doxycycline Rash   Hydrocodone    Nightmare after taking cough syrup w/hydrocodone   Lisinopril Cough   Developed ACE cough...   Penicillins Itching, Rash, Other (See Comments)   At injection site Did it involve  swelling of the face/tongue/throat, SOB, or low BP? No Did it involve sudden or severe rash/hives, skin peeling, or any reaction on the inside of your mouth or nose? No Did you need to seek medical attention at a hospital or doctor's office? No When did it last happen?"It was a long time ago" If all above answers are "NO", may proceed with cephalosporin use.   Tobramycin-dexamethasone Rash      Medication List    STOP taking these medications   aspirin EC 81 MG tablet   diphenhydrAMINE 50 MG tablet Commonly known as: BENADRYL     TAKE these medications   acetaminophen 325 MG tablet Commonly known as: TYLENOL Take 650 mg by mouth every 6 (six) hours as needed.   Allegra 180 MG tablet Generic drug: fexofenadine Take 180 mg by mouth every other day.   amLODipine 5 MG tablet Commonly known as: NORVASC Take 1 tablet (5 mg total) by mouth daily. What changed: when to take this   amoxicillin-clavulanate 875-125 MG tablet Commonly known as: AUGMENTIN Take 1 tablet by mouth every 12 (twelve) hours for 7 days.   CALCIUM PO Take 1 tablet by mouth daily with breakfast.   EPINEPHrine 0.3 mg/0.3 mL Soaj injection Commonly known as: EPI-PEN Inject 0.3 mg into the muscle as needed (as directed- FOR A SEVERE REACTION).   fluticasone 50 MCG/ACT nasal spray Commonly known as: FLONASE Place 1-2 sprays into both nostrils daily as needed for allergies or rhinitis.   GLUCOSAMINE CHONDR 1500 COMPLX PO Take  1 tablet by mouth 2 (two) times daily.   hydrALAZINE 50 MG tablet Commonly known as: APRESOLINE Take 0.5 tablets (25 mg total) by mouth 3 (three) times daily. What changed: when to take this   latanoprost 0.005 % ophthalmic solution Commonly known as: XALATAN Place 1 drop into both eyes at bedtime.   losartan 100 MG tablet Commonly known as: COZAAR TAKE 1/2 TABLET BY MOUTH THREE TIMES A DAY What changed:   how much to take  how to take this  when to take  this  additional instructions   METAMUCIL FIBER PO Take 1 Scoop by mouth daily. Mix one rounded teaspoonful into 8 oz water and drink once a day   metoprolol tartrate 50 MG tablet Commonly known as: LOPRESSOR Take 1 tablet (50 mg total) by mouth 3 (three) times daily.   nitroGLYCERIN 0.4 MG SL tablet Commonly known as: NITROSTAT Place 1 tablet (0.4 mg total) under the tongue every 5 (five) minutes as needed for chest pain.   pantoprazole 40 MG tablet Commonly known as: PROTONIX Take 1 tablet (40 mg total) by mouth daily. Start taking on: August 29, 2019 What changed: See the new instructions.   rosuvastatin 5 MG tablet Commonly known as: CRESTOR TAKE 1 TABLET BY MOUTH 3 TIMES A WEEK. (TAKE ON MONDAY, WEDNESDAY, AND FRIDAY) What changed:   how much to take  how to take this  when to take this  additional instructions   simethicone 125 MG chewable tablet Commonly known as: MYLICON Chew 350 mg by mouth every 6 (six) hours as needed for flatulence.   sodium chloride 0.65 % Soln nasal spray Commonly known as: OCEAN Place 1 spray into both nostrils daily as needed for congestion.   sucralfate 1 g tablet Commonly known as: CARAFATE TAKE 1 TABLET BY MOUTH BY MOUTH TWICE DAILY(ONE BEFORE DINNER) What changed: See the new instructions.   traMADol 50 MG tablet Commonly known as: ULTRAM Take 1 tablet (50 mg total) by mouth every 6 (six) hours as needed for severe pain. What changed:   how much to take  how to take this  when to take this  reasons to take this  additional instructions   Ventolin HFA 108 (90 Base) MCG/ACT inhaler Generic drug: albuterol Inhale 2 puffs into the lungs every 6 (six) hours as needed for wheezing or shortness of breath.   vitamin B-12 1000 MCG tablet Commonly known as: CYANOCOBALAMIN Take 1,000 mcg by mouth daily.   VITAMIN D PO Take 200 mcg by mouth daily with breakfast. Vitamin A  300 mcg Calcium  40 mcg       Allergies   Allergen Reactions  . Choline Fenofibrate Other (See Comments)     pt states INTOL to Trilipix w/ "thigh burning"  . Hctz [Hydrochlorothiazide]     Causes hyponatremia  . Simvastatin Other (See Comments)     pt states INTOL to STATINS \\T \ refuses to restart  . Levaquin [Levofloxacin In D5w]     Elevated BP  . Adhesive [Tape] Rash  . Bentyl [Dicyclomine Hcl] Other (See Comments)    "Made me feel weird and drained me"  . Ceclor [Cefaclor] Rash  . Clarithromycin Rash  . Codeine Nausea Only  . Doxycycline Rash  . Hydrocodone     Nightmare after taking cough syrup w/hydrocodone  . Lisinopril Cough    Developed ACE cough...  . Penicillins Itching, Rash and Other (See Comments)    At injection site Did it involve swelling  of the face/tongue/throat, SOB, or low BP? No Did it involve sudden or severe rash/hives, skin peeling, or any reaction on the inside of your mouth or nose? No Did you need to seek medical attention at a hospital or doctor's office? No When did it last happen?"It was a long time ago" If all above answers are "NO", may proceed with cephalosporin use.   . Tobramycin-Dexamethasone Rash    Consultations:  GI    Procedures/Studies: CT Chest W Contrast  Result Date: 08/03/2019 CLINICAL DATA:  Pancreatic adenocarcinoma. Evaluate treatment response. Diagnosed in January. Ongoing upper abdominal pain since diagnosis. Nausea. Weight loss. Diarrhea. History of breast cancer 8 years ago. EXAM: CT CHEST, ABDOMEN, AND PELVIS WITH CONTRAST TECHNIQUE: Multidetector CT imaging of the chest, abdomen and pelvis was performed following the standard protocol during bolus administration of intravenous contrast. CONTRAST:  132mL OMNIPAQUE IOHEXOL 300 MG/ML  SOLN COMPARISON:  05/06/2019 abdominopelvic CT. Most recent chest CT 11/29/2018. FINDINGS: CT CHEST FINDINGS Cardiovascular: Aortic atherosclerosis. Tortuous thoracic aorta. Normal heart size, without pericardial effusion. Median  sternotomy for CABG with native coronary artery atherosclerosis. No central pulmonary embolism, on this non-dedicated study. Mediastinum/Nodes: Small low left jugular nodes of maximally 4 mm are similar. No mediastinal or hilar adenopathy. Lungs/Pleura: No pleural fluid. Left lower lobe calcified granuloma. Musculoskeletal: No acute osseous abnormality. Presumably dystrophic calcifications within the left breast. CT ABDOMEN PELVIS FINDINGS Hepatobiliary: Heterogeneous arterial phase enhancement throughout the liver, including hyperenhancement within the posterior aspect of segment 4. Favored to be related to transient hepatic attenuation differences. The more nodular arterial phase hyperenhancement within the right hepatic lobe is not apparent today. No typical findings of hepatic metastasis. Focal steatosis adjacent the falciform ligament. No calcified gallstones or acute cholecystitis. Pneumobilia.  Common duct stent appropriately position. Pancreas: Pancreatic atrophy and duct dilatation involving the body and neck. Pancreatic duct measures maximally 1.0 cm on 104/7 today versus 1.2 cm on the prior. Radiation fiducials within the pancreatic head. The pancreatic head mass is favored to be identified as an area of hypoenhancement including at 2.1 x 1.7 cm on 113/7. Felt to be slightly decreased compared to 2.3 x 2.3 cm on the prior exam. Peripancreatic fat planes are again ill-defined within the region of the head, most likely radiation induced. No upstream pancreatitis. Spleen: Subcentimeter low-density splenic lesions are similar and of doubtful clinical significance. Adrenals/Urinary Tract: Normal adrenal glands. Normal kidneys, without hydronephrosis. Normal urinary bladder. Stomach/Bowel: Proximal gastric underdistention. Extensive colonic diverticulosis. Normal terminal ileum. Descending and proximal transverse duodenal inflammation or similar and likely radiation induced. Otherwise normal small bowel.  Vascular/Lymphatic: Aortic and branch vessel atherosclerosis. No arterial encasement. The superior mesenteric vein abuts the junction of the pancreatic duct dilatation and head lesion, including on 107/7. No acute thrombus. Portacaval node measures 8 mm on 97/7 versus 7 mm on the prior. No pelvic sidewall adenopathy. Reproductive: Normal uterus.  Prominent gonadal veins. Other: No significant free fluid. Moderate pelvic floor laxity. No abdominal ascites. No free intraperitoneal air. No evidence of omental or peritoneal disease. Musculoskeletal: Degenerative changes of both hips. Mild osteopenia. L4-5 grade 1 anterolisthesis. IMPRESSION: 1. Slight decrease in size of a pancreatic head mass. 2. No evidence of metastatic disease in the chest. 3. Heterogeneous arterial phase enhancement throughout the liver, favored to be related to transient hepatic attenuation differences. The more nodular arterial phase hyperenhancement within the right hepatic lobe on the prior exam is not apparent today. 4. Suspect similar radiation induced pancreatitis and duodenitis. 5. Pelvic  floor laxity. 6. Prominent gonadal veins, as can be seen with pelvic congestion syndrome. 7. Aortic Atherosclerosis (ICD10-I70.0). Electronically Signed   By: Abigail Miyamoto M.D.   On: 08/03/2019 13:35   CT ABDOMEN PELVIS W CONTRAST  Result Date: 08/24/2019 CLINICAL DATA:  Epigastric abdominal pain. Blood in stool. Pancreatic adenocarcinoma. Remote history of breast cancer. EXAM: CT ABDOMEN AND PELVIS WITH CONTRAST TECHNIQUE: Multidetector CT imaging of the abdomen and pelvis was performed using the standard protocol following bolus administration of intravenous contrast. CONTRAST:  152mL OMNIPAQUE IOHEXOL 300 MG/ML  SOLN COMPARISON:  08/03/2019 FINDINGS: Lower chest: Stable scarring or subsegmental atelectasis in the lung bases. Centrilobular emphysema. Old granulomatous disease. Descending thoracic aortic atherosclerotic calcification. Hepatobiliary:  Pneumobilia. Biliary stent remains in place. No compelling findings of metastatic disease to the liver. No overt gallbladder wall thickening. Pancreas: Prominently dilated dorsal pancreatic duct similar to prior, extending to an infiltrative mass in the head of the pancreas which surrounds the stent. Several fiducials remain in place. Overall the pancreatic mass does not appear significantly changed, measuring roughly 3.1 by 2.8 cm on image 26/2. Mild surrounding inflammatory stranding along the pancreatic head. Spleen: Unchanged with 3 small hypodense lesions which are probably benign but technically nonspecific. Adrenals/Urinary Tract: Unremarkable Stomach/Bowel: Indistinctness of tissue planes around the pancreatic head and adjacent portions of the duodenum, pancreatitis and duodenitis not exclude and some of this may be related to prior radiation therapy. Overall the degree of stranding around the descending and transverse duodenum is similar to 08/03/2019 and no dilation of the stomach or bowel is observed. Sigmoid colon diverticulosis is present. No active diverticulitis identified. Vascular/Lymphatic: The pancreatic head mass narrows the confluence of the splenic vein and the SMV into the portal vein. Aortoiliac atherosclerotic vascular disease. Stable prominence of parametrial venous structures, left greater than right, pelvic congestion syndrome not excluded. Reproductive: Unremarkable Other: Low-grade stranding in the central mesentery, minimally increased from prior, nonspecific. Musculoskeletal: Degenerative arthropathy of both hips. Grade 1 degenerative anterolisthesis at L4-5. Multilevel lumbar spondylosis and degenerative disc disease with left greater than right foraminal impingement at L5-S1. central narrowing of the thecal sac at the L4-5 level. IMPRESSION: 1. Similar appearance of the infiltrative mass in the head of the pancreas with surrounding fiducials. The mass narrows the confluence of the  splenic vein and the SMV into the portal vein. 2. Indistinctness of tissue planes around the pancreatic head and adjacent portions of the duodenum, pancreatitis and duodenitis could cause this appearance, and radiation therapy in the past may be predisposing. 3. Other imaging findings of potential clinical significance: Sigmoid colon diverticulosis. Stable prominence of parametrial venous structures, left greater than right, pelvic congestion syndrome not excluded. Multilevel lumbar spondylosis and degenerative disc disease causing left greater than right foraminal impingement at L5-S1. 4. Emphysema and aortic atherosclerosis. Aortic Atherosclerosis (ICD10-I70.0) and Emphysema (ICD10-J43.9). Electronically Signed   By: Van Clines M.D.   On: 08/24/2019 14:58   CT Abdomen Pelvis W Contrast  Result Date: 08/03/2019 CLINICAL DATA:  Pancreatic adenocarcinoma. Evaluate treatment response. Diagnosed in January. Ongoing upper abdominal pain since diagnosis. Nausea. Weight loss. Diarrhea. History of breast cancer 8 years ago. EXAM: CT CHEST, ABDOMEN, AND PELVIS WITH CONTRAST TECHNIQUE: Multidetector CT imaging of the chest, abdomen and pelvis was performed following the standard protocol during bolus administration of intravenous contrast. CONTRAST:  172mL OMNIPAQUE IOHEXOL 300 MG/ML  SOLN COMPARISON:  05/06/2019 abdominopelvic CT. Most recent chest CT 11/29/2018. FINDINGS: CT CHEST FINDINGS Cardiovascular: Aortic atherosclerosis. Tortuous thoracic aorta.  Normal heart size, without pericardial effusion. Median sternotomy for CABG with native coronary artery atherosclerosis. No central pulmonary embolism, on this non-dedicated study. Mediastinum/Nodes: Small low left jugular nodes of maximally 4 mm are similar. No mediastinal or hilar adenopathy. Lungs/Pleura: No pleural fluid. Left lower lobe calcified granuloma. Musculoskeletal: No acute osseous abnormality. Presumably dystrophic calcifications within the left  breast. CT ABDOMEN PELVIS FINDINGS Hepatobiliary: Heterogeneous arterial phase enhancement throughout the liver, including hyperenhancement within the posterior aspect of segment 4. Favored to be related to transient hepatic attenuation differences. The more nodular arterial phase hyperenhancement within the right hepatic lobe is not apparent today. No typical findings of hepatic metastasis. Focal steatosis adjacent the falciform ligament. No calcified gallstones or acute cholecystitis. Pneumobilia.  Common duct stent appropriately position. Pancreas: Pancreatic atrophy and duct dilatation involving the body and neck. Pancreatic duct measures maximally 1.0 cm on 104/7 today versus 1.2 cm on the prior. Radiation fiducials within the pancreatic head. The pancreatic head mass is favored to be identified as an area of hypoenhancement including at 2.1 x 1.7 cm on 113/7. Felt to be slightly decreased compared to 2.3 x 2.3 cm on the prior exam. Peripancreatic fat planes are again ill-defined within the region of the head, most likely radiation induced. No upstream pancreatitis. Spleen: Subcentimeter low-density splenic lesions are similar and of doubtful clinical significance. Adrenals/Urinary Tract: Normal adrenal glands. Normal kidneys, without hydronephrosis. Normal urinary bladder. Stomach/Bowel: Proximal gastric underdistention. Extensive colonic diverticulosis. Normal terminal ileum. Descending and proximal transverse duodenal inflammation or similar and likely radiation induced. Otherwise normal small bowel. Vascular/Lymphatic: Aortic and branch vessel atherosclerosis. No arterial encasement. The superior mesenteric vein abuts the junction of the pancreatic duct dilatation and head lesion, including on 107/7. No acute thrombus. Portacaval node measures 8 mm on 97/7 versus 7 mm on the prior. No pelvic sidewall adenopathy. Reproductive: Normal uterus.  Prominent gonadal veins. Other: No significant free fluid.  Moderate pelvic floor laxity. No abdominal ascites. No free intraperitoneal air. No evidence of omental or peritoneal disease. Musculoskeletal: Degenerative changes of both hips. Mild osteopenia. L4-5 grade 1 anterolisthesis. IMPRESSION: 1. Slight decrease in size of a pancreatic head mass. 2. No evidence of metastatic disease in the chest. 3. Heterogeneous arterial phase enhancement throughout the liver, favored to be related to transient hepatic attenuation differences. The more nodular arterial phase hyperenhancement within the right hepatic lobe on the prior exam is not apparent today. 4. Suspect similar radiation induced pancreatitis and duodenitis. 5. Pelvic floor laxity. 6. Prominent gonadal veins, as can be seen with pelvic congestion syndrome. 7. Aortic Atherosclerosis (ICD10-I70.0). Electronically Signed   By: Abigail Miyamoto M.D.   On: 08/03/2019 13:35   DG CHEST PORT 1 VIEW  Result Date: 08/25/2019 CLINICAL DATA:  Fever this morning EXAM: PORTABLE CHEST 1 VIEW COMPARISON:  Abdominal CT from yesterday FINDINGS: Vague density over the right base is favored to reflect a skin fold given the discrete lateral appearance and hazy appearance. No opacity seen in this area on recent abdominal CT. Normal heart size and mediastinal contours. CABG. IMPRESSION: 1. Vague opacity at the right base favored to reflect a skin fold. No opacity in this area on CT from yesterday. 2. Otherwise unremarkable study. Electronically Signed   By: Monte Fantasia M.D.   On: 08/25/2019 07:06   DG ERCP  Result Date: 08/25/2019 CLINICAL DATA:  ERCP with stent placement. EXAM: ERCP TECHNIQUE: Multiple spot images obtained with the fluoroscopic device and submitted for interpretation post-procedure. FLUOROSCOPY TIME:  Fluoroscopy Time:  2 minutes and 56 seconds Number of Acquired Spot Images: 26 COMPARISON:  CT dated August 24, 2019 FINDINGS: Initial images demonstrate a metallic common bile duct stent. Subsequent images demonstrate  cannulation of the common bile duct with injection of contrast. There is some mild intrahepatic biliary ductal dilatation. There is minimal contrast flowing through the stent. Subsequent images demonstrate placement of an additional metallic stent through the pre-existing stent. IMPRESSION: ERCP with stent placement as above. These images were submitted for radiologic interpretation only. Please see the procedural report for the amount of contrast and the fluoroscopy time utilized. Electronically Signed   By: Constance Holster M.D.   On: 08/25/2019 16:19      Procedures: ERCP   Subjective: Patient is feeling better, her abdominal pain is controlled with analgesics, this am she has neck pain that is improved with local measures.   Discharge Exam: Vitals:   08/27/19 2119 08/28/19 0157  BP: (!) 168/61 (!) 153/54  Pulse: 73 63  Resp: 16 16  Temp: 97.8 F (36.6 C) 98 F (36.7 C)  SpO2: 94% 92%   Vitals:   08/27/19 0506 08/27/19 1544 08/27/19 2119 08/28/19 0157  BP: (!) 166/68 (!) 171/71 (!) 168/61 (!) 153/54  Pulse: 68 68 73 63  Resp: 16 20 16 16   Temp: 98 F (36.7 C) (!) 97.5 F (36.4 C) 97.8 F (36.6 C) 98 F (36.7 C)  TempSrc:  Oral    SpO2: 91% 95% 94% 92%  Weight:      Height:        General: Not in pain or dyspnea.  Neurology: Awake and alert, non focal  E ENT: no pallor, no icterus, oral mucosa moist Cardiovascular: No JVD. S1-S2 present, rhythmic, no gallops, rubs, or murmurs. No lower extremity edema. Pulmonary: positive breath sounds bilaterally, adequate air movement, no wheezing, rhonchi or rales. Gastrointestinal. Abdomen soft with no organomegaly, non tender, no rebound or guarding Skin. No rashes Musculoskeletal: no joint deformities   The results of significant diagnostics from this hospitalization (including imaging, microbiology, ancillary and laboratory) are listed below for reference.     Microbiology: Recent Results (from the past 240 hour(s))   SARS Coronavirus 2 by RT PCR (hospital order, performed in [facility] hospital lab) Nasopharyngeal Nasopharyngeal Swab     Status: None   Collection Time: 08/24/19 11:39 AM   Specimen: Nasopharyngeal Swab  Result Value Ref Range Status   SARS Coronavirus 2 NEGATIVE NEGATIVE Final    Comment: (NOTE) SARS-CoV-2 target nucleic acids are NOT DETECTED.  The SARS-CoV-2 RNA is generally detectable in upper and lower respiratory specimens during the acute phase of infection. The lowest concentration of SARS-CoV-2 viral copies this assay can detect is 250 copies / mL. A negative result does not preclude SARS-CoV-2 infection and should not be used as the sole basis for treatment or other patient management decisions.  A negative result may occur with improper specimen collection / handling, submission of specimen other than nasopharyngeal swab, presence of viral mutation(s) within the areas targeted by this assay, and inadequate number of viral copies (<250 copies / mL). A negative result must be combined with clinical observations, patient history, and epidemiological information.  Fact Sheet for Patients:   StrictlyIdeas.no  Fact Sheet for Healthcare Providers: BankingDealers.co.za  This test is not yet approved or  cleared by the [place] FDA and has been authorized for detection and/or diagnosis of SARS-CoV-2 by FDA under an Emergency Use Authorization (EUA).  This  EUA will remain in effect (meaning this test can be used) for the duration of the COVID-19 declaration under Section 564(b)(1) of the Act, 21 U.S.C. section 360bbb-3(b)(1), unless the authorization is terminated or revoked sooner.  Performed at [facility], [place] [address] [place], [place] [address]   Culture, blood (routine x 2)     Status: None (Preliminary result)   Collection Time: 08/25/19  7:21 AM   Specimen: BLOOD  Result Value Ref Range  Status   Specimen Description   Final    BLOOD LEFT ARM Performed at [facility] [address] [place], [place] [address]    Special Requests   Final    BOTTLES DRAWN AEROBIC AND ANAEROBIC Blood Culture results may not be optimal due to an inadequate volume of blood received in culture bottles Performed at [facility] [address] [place], [place] [address]    Culture   Final    NO GROWTH 2 DAYS Performed at [facility] [address], [place], [place] [address]    Report Status PENDING  Incomplete  Culture, blood (routine x 2)     Status: None (Preliminary result)   Collection Time: 08/25/19  9:26 AM   Specimen: BLOOD  Result Value Ref Range Status   Specimen Description   Final    BLOOD RIGHT ARM Performed at [facility] [address], [place], [place] [address]    Special Requests   Final    BOTTLES DRAWN AEROBIC ONLY Blood Culture adequate volume Performed at [facility] [address] [place], [place] [address]    Culture   Final    NO GROWTH 2 DAYS Performed at [facility] [address], [place],  [address]    Report Status PENDING  Incomplete     Labs: BNP (last 3 results) No results for input(s): BNP in the last 8760 hours. Basic Metabolic Panel: Recent Labs  Lab 08/24/19 1148 08/25/19 0507 08/26/19 0128 08/27/19 0312 08/28/19 0324  NA 135 133* 130* 137 135  K 4.2 4.3 3.7 3.7 3.5  CL 96* 96* 98 99 98  CO2 28 29 25 26 29   GLUCOSE 115* 184* 230* 119* 99  BUN 11 10 11 14 12   CREATININE 0.58 0.69 0.56 0.53 0.53  CALCIUM 9.0 8.7* 8.4* 8.6* 8.6*   Liver Function Tests: Recent Labs  Lab 08/23/19 1116 08/24/19 1148 08/25/19 0507 08/27/19 0312 08/28/19 0324  AST 20 552* 326*  324* 41 24  ALT 24 273* 338*  335* 129* 85*  ALKPHOS 108 154* 256*  243* 164* 141*  BILITOT 0.8 1.3* 3.1*  3.0* 3.2* 1.5*  PROT 7.4 7.5 6.8  6.5 6.4* 6.2*   ALBUMIN 3.8 4.1 3.6  3.7 3.2* 3.2*   Recent Labs  Lab 08/24/19 1148  LIPASE 14   No results for input(s): AMMONIA in the last 168 hours. CBC: Recent Labs  Lab 08/23/19 1116 08/24/19 1148 08/25/19 0507 08/25/19 0507 08/25/19 0926 08/25/19 1619 08/26/19 0758 08/27/19 0312 08/28/19 0324  WBC 6.9   < > 9.0  --  10.1  --  10.2 9.1 6.0  NEUTROABS 3.9  --  6.6  --   --   --   --  7.1 3.5  HGB 10.8*   < > 8.7*   < > 8.3* 7.2* 7.1* 10.2* 11.3*  HCT 32.5*   < > 26.4*   < > 25.1* 22.4* 22.0*  30.0* 33.9*  MCV 92.3   < > 93.3  --  93.0  --  93.6 88.5 90.4  PLT 274   < > 277  --  201  --  206 186 202   < > = values in this interval not displayed.   Cardiac Enzymes: No results for input(s): CKTOTAL, CKMB, CKMBINDEX, TROPONINI in the last 168 hours. BNP: Invalid input(s): POCBNP CBG: Recent Labs  Lab 08/25/19 0520 08/25/19 1346  GLUCAP 158* 128*   D-Dimer No results for input(s): DDIMER in the last 72 hours. Hgb A1c No results for input(s): HGBA1C in the last 72 hours. Lipid Profile No results for input(s): CHOL, HDL, LDLCALC, TRIG, CHOLHDL, LDLDIRECT in the last 72 hours. Thyroid function studies No results for input(s): TSH, T4TOTAL, T3FREE, THYROIDAB in the last 72 hours.  Invalid input(s): FREET3 Anemia work up Recent Labs    08/25/19 1619  FERRITIN 334*  TIBC 261  IRON 16*   Urinalysis    Component Value Date/Time   COLORURINE YELLOW 08/26/2019 0100   APPEARANCEUR CLEAR 08/26/2019 0100   LABSPEC 1.015 08/26/2019 0100   PHURINE 5.0 08/26/2019 0100   GLUCOSEU >=500 (A) 08/26/2019 0100   GLUCOSEU >=1000 (A) 11/26/2018 1330   HGBUR MODERATE (A) 08/26/2019 0100   BILIRUBINUR NEGATIVE 08/26/2019 0100   BILIRUBINUR neg 11/01/2015 1650   KETONESUR NEGATIVE 08/26/2019 0100   PROTEINUR NEGATIVE 08/26/2019 0100   UROBILINOGEN 0.2 11/26/2018 1330   NITRITE NEGATIVE 08/26/2019 0100   LEUKOCYTESUR NEGATIVE 08/26/2019 0100   Sepsis Labs Invalid input(s):  PROCALCITONIN,  WBC,  LACTICIDVEN Microbiology Recent Results (from the past 240 hour(s))  SARS Coronavirus 2 by RT PCR (hospital order, performed in [facility] hospital lab) Nasopharyngeal Nasopharyngeal Swab     Status: None   Collection Time: 08/24/19 11:39 AM   Specimen: Nasopharyngeal Swab  Result Value Ref Range Status   SARS Coronavirus 2 NEGATIVE NEGATIVE Final    Comment: (NOTE) SARS-CoV-2 target nucleic acids are NOT DETECTED.  The SARS-CoV-2 RNA is generally detectable in upper and lower respiratory specimens during the acute phase of infection. The lowest concentration of SARS-CoV-2 viral copies this assay can detect is 250 copies / mL. A negative result does not preclude SARS-CoV-2 infection and should not be used as the sole basis for treatment or other patient management decisions.  A negative result may occur with improper specimen collection / handling, submission of specimen other than nasopharyngeal swab, presence of viral mutation(s) within the areas targeted by this assay, and inadequate number of viral copies (<250 copies / mL). A negative result must be combined with clinical observations, patient history, and epidemiological information.  Fact Sheet for Patients:   StrictlyIdeas.no  Fact Sheet for Healthcare Providers: BankingDealers.co.za  This test is not yet approved or  cleared by the [place] FDA and has been authorized for detection and/or diagnosis of SARS-CoV-2 by FDA under an Emergency Use Authorization (EUA).  This EUA will remain in effect (meaning this test can be used) for the duration of the COVID-19 declaration under Section 564(b)(1) of the Act, 21 U.S.C. section 360bbb-3(b)(1), unless the authorization is terminated or revoked sooner.  Performed at [facility], [place] [address], [place], [place] [address]   Culture, blood (routine x 2)     Status: None (Preliminary  result)   Collection Time: 08/25/19  7:21 AM   Specimen: BLOOD  Result Value Ref Range Status   Specimen Description   Final    BLOOD  LEFT ARM Performed at [facility], [place] [address] [place], [place] [address]    Special Requests   Final    BOTTLES DRAWN AEROBIC AND ANAEROBIC Blood Culture results may not be optimal due to an inadequate volume of blood received in culture bottles Performed at [facility] [address], [place], [place] [address]    Culture   Final    NO GROWTH 2 DAYS Performed at [facility] [address] [place], [place] [address]    Report Status PENDING  Incomplete  Culture, blood (routine x 2)     Status: None (Preliminary result)   Collection Time: 08/25/19  9:26 AM   Specimen: BLOOD  Result Value Ref Range Status   Specimen Description   Final    BLOOD RIGHT ARM Performed at [facility] [address], [place], [place] [address]    Special Requests   Final    BOTTLES DRAWN AEROBIC ONLY Blood Culture adequate volume Performed at [facility] [address] [place], [place] [address]    Culture   Final    NO GROWTH 2 DAYS Performed at [facility] [address] [place], [place] [address]    Report Status PENDING  Incomplete     Time coordinating discharge: 45 minutes  SIGNED:   Tawni Millers, MD  [facility] 08/28/2019, 11:40 AM

## 2019-08-28 NOTE — Evaluation (Signed)
Physical Therapy Evaluation Patient Details Name: [name] MRN: [id number] DOB: Apr 23, 1935 Today's Date: 08/28/2019   History of Present Illness  Pt is 84 yo female with PMH of CAD with CABG, breast CA s/p lumpectomy, pancreatic adenocarcinoma with pancreatic stent on 11/20.  Pt admitted with epigastric abdominal pain and elevation of liver enzymes, complicated with upper GI bleed due to tumor ingrowth/ progression into the biliary stent.  Pt s/p ERCP with balloon sweeping and placed overlapping pancreatic stent.  Clinical Impression  Pt admitted with above diagnosis.  She demonstrated safe transfers and gait independently (had supervision during eval but capable of independence).  She was able to perform tolieting ADLs without UE support and steady balance.  Pt did have some c/o chronic neck stiffness and was educated on and demonstrated ROM and exercises to promote increased blood flow and ROM.  No further acute PT indicated.     Follow Up Recommendations No PT follow up    Equipment Recommendations  None recommended by PT    Recommendations for Other Services       Precautions / Restrictions Precautions Precautions: None      Mobility  Bed Mobility Overal bed mobility: Independent             General bed mobility comments: in chair at arrival  Transfers Overall transfer level: Needs assistance Equipment used: None Transfers: Sit to/from Stand Sit to Stand: Supervision         General transfer comment: Pt was able to perform sit to stand from toilet and recliner safely today.  Additionally, able to perform toielting ADLs.  Ambulation/Gait Ambulation/Gait assistance: Supervision Gait Distance (Feet): 400 Feet Assistive device: None Gait Pattern/deviations: WFL(Within Functional Limits) Gait velocity: normal   General Gait Details: steady gait, no LOB: able to look up/down/L/R, stop, turn, navigate around objects and step over objects without  LOB  Stairs            Wheelchair Mobility    Modified Rankin (Stroke Patients Only)       Balance Overall balance assessment: Independent                                           Pertinent Vitals/Pain Pain Assessment: 0-10 Pain Score: 1  Pain Location: neck stiff Pain Descriptors / Indicators: Discomfort (stiff) Pain Intervention(s): Limited activity within patient's tolerance;Relaxation    Home Living Family/patient expects to be discharged to:: Private residence Living Arrangements: Spouse/significant other Available Help at Discharge: Family;Available 24 hours/day Type of Home: House Home Access: Stairs to enter Entrance Stairs-Rails: None Entrance Stairs-Number of Steps: 2 (step on porch then step in house) Home Layout: One level Home Equipment: Clinical cytogeneticist - 2 wheels      Prior Function Level of Independence: Independent         Comments: Pt reports independent with community ambulation, driving, IADLs and ADLs     Hand Dominance        Extremity/Trunk Assessment   Upper Extremity Assessment Upper Extremity Assessment: Overall WFL for tasks assessed    Lower Extremity Assessment Lower Extremity Assessment: Overall WFL for tasks assessed    Cervical / Trunk Assessment Cervical / Trunk Assessment: Normal  Communication   Communication: No difficulties  Cognition Arousal/Alertness: Awake/alert Behavior During Therapy: WFL for tasks assessed/performed Overall Cognitive Status: Within Functional Limits for tasks assessed  General Comments General comments (skin integrity, edema, etc.): No c/o dizziness at this time.  Reports did have some dizziness associated with pain meds yesterday.  She did have c/o chronic neck and shoulder stiffiness.  Pt pointed at sub occipital , paraspinal, and upper trap muscles.  Reports that she occasionally gets massages and she  currently had hot packs in place.  Described as stiffness and annoyance, not signficant pain.  Educated on and demonstrated soft tissue mobilization, upper trap stretches and chin tuck; and continued use of heat as needed.    Exercises     Assessment/Plan    PT Assessment Patent does not need any further PT services  PT Problem List         PT Treatment Interventions      PT Goals (Current goals can be found in the Care Plan section)  Acute Rehab PT Goals Patient Stated Goal: return home PT Goal Formulation: All assessment and education complete, DC therapy Time For Goal Achievement: 08/28/19 Potential to Achieve Goals: Good    Frequency     Barriers to discharge        Co-evaluation               AM-PAC PT "6 Clicks" Mobility  Outcome Measure Help needed turning from your back to your side while in a flat bed without using bedrails?: None Help needed moving from lying on your back to sitting on the side of a flat bed without using bedrails?: None Help needed moving to and from a bed to a chair (including a wheelchair)?: None Help needed standing up from a chair using your arms (e.g., wheelchair or bedside chair)?: None Help needed to walk in hospital room?: None Help needed climbing 3-5 steps with a railing? : None 6 Click Score: 24    End of Session   Activity Tolerance: Patient tolerated treatment well Patient left: in chair;with call bell/phone within reach Nurse Communication: Mobility status      Time: 1030-1100 PT Time Calculation (min) (ACUTE ONLY): 30 min   Charges:   PT Evaluation $PT Eval Low Complexity: 1 Low PT Treatments $Therapeutic Exercise: 8-22 mins        Abran Richard, PT Acute Rehab Services Pager [phone] [facility] [phone]    Karlton Lemon 08/28/2019, 11:08 AM

## 2019-08-29 ENCOUNTER — Other Ambulatory Visit: Payer: Self-pay | Admitting: Cardiology

## 2019-08-29 ENCOUNTER — Telehealth: Payer: Self-pay | Admitting: Cardiology

## 2019-08-29 NOTE — Telephone Encounter (Signed)
Pt just discharged on Sat and now heart rate faster, 114 then up to 120, she will take her lopressor that she has not taken if she does not feel better she will go to urgent care.  No hx of a fib - and she feels this is regular.  Not on anticoagulation due to GI bleed.  ASA was stopped.

## 2019-08-30 LAB — CULTURE, BLOOD (ROUTINE X 2)
Culture: NO GROWTH
Culture: NO GROWTH
Special Requests: ADEQUATE

## 2019-08-31 ENCOUNTER — Telehealth: Payer: Self-pay | Admitting: Family Medicine

## 2019-08-31 ENCOUNTER — Telehealth: Payer: Self-pay | Admitting: *Deleted

## 2019-08-31 NOTE — Telephone Encounter (Signed)
Patient called after hours service 08/29/19 8:27 am: Stated she came home from hospital yesterday for GI bleed, meds changed while in hospital. They took her off metoprolol. HR is 114-120, patient feels shaky and weak. BP/Hr meds were prescribed by her cardiologist. She is going to attempt to reach that office. If she cannot reach them fairly quickly or if she starts feeling worse, she will go right to the ER.

## 2019-08-31 NOTE — Telephone Encounter (Signed)
I see cardio note from 08/29/19 stating pt is going to restart her lopressor

## 2019-08-31 NOTE — Telephone Encounter (Signed)
Dr. Loletha Grayer see pt message as a FYI.  Left pt voicemail for her to call us back so we could see how she was doing and to check in on her.

## 2019-08-31 NOTE — Telephone Encounter (Signed)
Attempted to call the pt to inquire how her symptoms are, per Cecilie Kicks NP, and phone was busy.  Will route to triage to follow-up with the pt tomorrow.

## 2019-08-31 NOTE — Telephone Encounter (Signed)
-----   Message from Isaiah Serge, NP sent at 08/29/2019  9:13 AM EDT ----- Please check with pt on the 6th to see if feeling better, if not may need appt

## 2019-09-01 ENCOUNTER — Other Ambulatory Visit: Payer: Self-pay | Admitting: Hematology and Oncology

## 2019-09-01 ENCOUNTER — Inpatient Hospital Stay: Payer: Medicare Other

## 2019-09-01 ENCOUNTER — Other Ambulatory Visit: Payer: Self-pay

## 2019-09-01 ENCOUNTER — Inpatient Hospital Stay: Payer: Medicare Other | Attending: Hematology and Oncology | Admitting: Hematology and Oncology

## 2019-09-01 VITALS — BP 154/57 | HR 57 | Temp 97.2°F | Resp 16 | Ht <= 58 in | Wt 119.1 lb

## 2019-09-01 DIAGNOSIS — K219 Gastro-esophageal reflux disease without esophagitis: Secondary | ICD-10-CM | POA: Insufficient documentation

## 2019-09-01 DIAGNOSIS — K831 Obstruction of bile duct: Secondary | ICD-10-CM | POA: Diagnosis not present

## 2019-09-01 DIAGNOSIS — R1013 Epigastric pain: Secondary | ICD-10-CM | POA: Diagnosis not present

## 2019-09-01 DIAGNOSIS — D649 Anemia, unspecified: Secondary | ICD-10-CM | POA: Insufficient documentation

## 2019-09-01 DIAGNOSIS — M5137 Other intervertebral disc degeneration, lumbosacral region: Secondary | ICD-10-CM | POA: Insufficient documentation

## 2019-09-01 DIAGNOSIS — Z888 Allergy status to other drugs, medicaments and biological substances status: Secondary | ICD-10-CM | POA: Diagnosis not present

## 2019-09-01 DIAGNOSIS — E119 Type 2 diabetes mellitus without complications: Secondary | ICD-10-CM | POA: Insufficient documentation

## 2019-09-01 DIAGNOSIS — G893 Neoplasm related pain (acute) (chronic): Secondary | ICD-10-CM | POA: Insufficient documentation

## 2019-09-01 DIAGNOSIS — I2510 Atherosclerotic heart disease of native coronary artery without angina pectoris: Secondary | ICD-10-CM | POA: Diagnosis not present

## 2019-09-01 DIAGNOSIS — M47816 Spondylosis without myelopathy or radiculopathy, lumbar region: Secondary | ICD-10-CM | POA: Diagnosis not present

## 2019-09-01 DIAGNOSIS — Z885 Allergy status to narcotic agent status: Secondary | ICD-10-CM | POA: Diagnosis not present

## 2019-09-01 DIAGNOSIS — Z880 Allergy status to penicillin: Secondary | ICD-10-CM | POA: Diagnosis not present

## 2019-09-01 DIAGNOSIS — K2980 Duodenitis without bleeding: Secondary | ICD-10-CM | POA: Insufficient documentation

## 2019-09-01 DIAGNOSIS — C259 Malignant neoplasm of pancreas, unspecified: Secondary | ICD-10-CM

## 2019-09-01 DIAGNOSIS — K921 Melena: Secondary | ICD-10-CM | POA: Insufficient documentation

## 2019-09-01 DIAGNOSIS — D500 Iron deficiency anemia secondary to blood loss (chronic): Secondary | ICD-10-CM

## 2019-09-01 DIAGNOSIS — C250 Malignant neoplasm of head of pancreas: Secondary | ICD-10-CM | POA: Diagnosis not present

## 2019-09-01 DIAGNOSIS — R110 Nausea: Secondary | ICD-10-CM | POA: Diagnosis not present

## 2019-09-01 DIAGNOSIS — Z881 Allergy status to other antibiotic agents status: Secondary | ICD-10-CM | POA: Insufficient documentation

## 2019-09-01 DIAGNOSIS — Z853 Personal history of malignant neoplasm of breast: Secondary | ICD-10-CM | POA: Insufficient documentation

## 2019-09-01 DIAGNOSIS — Z8719 Personal history of other diseases of the digestive system: Secondary | ICD-10-CM | POA: Insufficient documentation

## 2019-09-01 DIAGNOSIS — C801 Malignant (primary) neoplasm, unspecified: Secondary | ICD-10-CM | POA: Diagnosis not present

## 2019-09-01 DIAGNOSIS — I700 Atherosclerosis of aorta: Secondary | ICD-10-CM | POA: Diagnosis not present

## 2019-09-01 DIAGNOSIS — Z79899 Other long term (current) drug therapy: Secondary | ICD-10-CM | POA: Insufficient documentation

## 2019-09-01 DIAGNOSIS — G4733 Obstructive sleep apnea (adult) (pediatric): Secondary | ICD-10-CM | POA: Insufficient documentation

## 2019-09-01 DIAGNOSIS — J439 Emphysema, unspecified: Secondary | ICD-10-CM | POA: Diagnosis not present

## 2019-09-01 DIAGNOSIS — K859 Acute pancreatitis without necrosis or infection, unspecified: Secondary | ICD-10-CM | POA: Diagnosis not present

## 2019-09-01 LAB — CBC WITH DIFFERENTIAL (CANCER CENTER ONLY)
Abs Immature Granulocytes: 0.03 10*3/uL (ref 0.00–0.07)
Basophils Absolute: 0 10*3/uL (ref 0.0–0.1)
Basophils Relative: 1 %
Eosinophils Absolute: 0.2 10*3/uL (ref 0.0–0.5)
Eosinophils Relative: 4 %
HCT: 34.2 % — ABNORMAL LOW (ref 36.0–46.0)
Hemoglobin: 11.3 g/dL — ABNORMAL LOW (ref 12.0–15.0)
Immature Granulocytes: 1 %
Lymphocytes Relative: 25 %
Lymphs Abs: 1.3 10*3/uL (ref 0.7–4.0)
MCH: 30.2 pg (ref 26.0–34.0)
MCHC: 33 g/dL (ref 30.0–36.0)
MCV: 91.4 fL (ref 80.0–100.0)
Monocytes Absolute: 0.5 10*3/uL (ref 0.1–1.0)
Monocytes Relative: 10 %
Neutro Abs: 3.1 10*3/uL (ref 1.7–7.7)
Neutrophils Relative %: 59 %
Platelet Count: 294 10*3/uL (ref 150–400)
RBC: 3.74 MIL/uL — ABNORMAL LOW (ref 3.87–5.11)
RDW: 13.7 % (ref 11.5–15.5)
WBC Count: 5.2 10*3/uL (ref 4.0–10.5)
nRBC: 0 % (ref 0.0–0.2)

## 2019-09-01 LAB — IRON AND TIBC
Iron: 40 ug/dL — ABNORMAL LOW (ref 41–142)
Saturation Ratios: 12 % — ABNORMAL LOW (ref 21–57)
TIBC: 321 ug/dL (ref 236–444)
UIBC: 281 ug/dL (ref 120–384)

## 2019-09-01 LAB — CMP (CANCER CENTER ONLY)
ALT: 37 U/L (ref 0–44)
AST: 21 U/L (ref 15–41)
Albumin: 3.4 g/dL — ABNORMAL LOW (ref 3.5–5.0)
Alkaline Phosphatase: 136 U/L — ABNORMAL HIGH (ref 38–126)
Anion gap: 9 (ref 5–15)
BUN: 9 mg/dL (ref 8–23)
CO2: 28 mmol/L (ref 22–32)
Calcium: 9.3 mg/dL (ref 8.9–10.3)
Chloride: 98 mmol/L (ref 98–111)
Creatinine: 0.76 mg/dL (ref 0.44–1.00)
GFR, Est AFR Am: >60 mL/min (ref >60)
GFR, Estimated: >60 mL/min (ref >60)
Glucose, Bld: 183 mg/dL — ABNORMAL HIGH (ref 70–99)
Potassium: 3.9 mmol/L (ref 3.5–5.1)
Sodium: 135 mmol/L (ref 135–145)
Total Bilirubin: 0.6 mg/dL (ref 0.3–1.2)
Total Protein: 7.2 g/dL (ref 6.5–8.1)

## 2019-09-01 LAB — RETIC PANEL
Immature Retic Fract: 8.9 % (ref 2.3–15.9)
RBC.: 3.75 MIL/uL — ABNORMAL LOW (ref 3.87–5.11)
Retic Count, Absolute: 56.6 10*3/uL (ref 19.0–186.0)
Retic Ct Pct: 1.5 % (ref 0.4–3.1)
Reticulocyte Hemoglobin: 31.6 pg (ref >27.9)

## 2019-09-01 LAB — FERRITIN: Ferritin: 230 ng/mL (ref 11–307)

## 2019-09-01 LAB — LACTATE DEHYDROGENASE: LDH: 155 U/L (ref 98–192)

## 2019-09-01 MED ORDER — OXYCODONE HCL 5 MG PO TABS: 5.0000 mg | ORAL_TABLET | Freq: Four times a day (QID) | ORAL | 0 refills | Status: DC | PRN

## 2019-09-01 MED FILL — oxyCODONE HCL 5 MG TABS: 5 | 7 days supply | Qty: 28 | Fill #0

## 2019-09-01 NOTE — Telephone Encounter (Signed)
Called and spoke to patient. She states that she is feeling better. Denies palpitations. She is taking her lopressor. No complaints at this time. Instructed the patient to let us know if her Sx return. She verbalized understanding.

## 2019-09-01 NOTE — Progress Notes (Signed)
[place] [phone]   [phone]  PROGRESS NOTE  Patient Care Team: Ronnald Nian, DO as PCP - General (Family Medicine) Josue Hector, MD as PCP - Cardiology (Cardiology) Neldon Mc, MD as Surgeon (General Surgery) Gery Pray, MD (Radiation Oncology) Magrinat, Virgie Dad, MD (Hematology and Oncology) Cameron Sprang, MD as Consulting Physician (Neurology) Placke, Dawn, RN (Inactive) as Registered Nurse Placke, Dawn, RN (Inactive) as Oncology Nurse Navigator  Hematological/Oncological History  # Pancreatic Adenocarcinoma Stage IA (T1N0M0) 1) 11/26/2018: patient had visit with PCP for pruritis, found to have elevated LFTs 2) 11/27/2018: patient presented to the ED. Underwent CT scan which revealed 1.9 x 1.6 x 1.6 cm cm mass in the head of the pancreas with biliary duct dilation 3) 11/28/2018: ERCP performed to place stent for biliary obstruction  4) 12/10/2018: EUS w/ FNA reveals adenocarcinoma of the pancreas 5) 12/18/2018: establish care with Dr. Lorenso Courier  6) 12/23/2018: surgical visit with Dr. Barry Dienes 7) 12/30/2018: radiation oncology visit with Dr. Sondra Come. Agreeable to SBRT to pancreatic mass.  8) 01/2019: Radiation delayed due to infection with COVID-19.  9) 03/08/2019-03/18/2019: Palliative radiation therapy to pancreatic mass 10) 05/06/2019: CT scan abdomen shows modest increase in tumor size with no clear evidence of metastatic spread.  11) 08/25/2019: ERCP performed while admitted due to acute drop in Hgb and marked increase in LFTs.   #Breast Cancer T2 N0, stage IIA invasive ductal carcinoma, grade 3,  ER+/PR+/HER-2 not amplified 1) December 2012:  leftlumpectomy and sentinel lymph node biopsy  2) March 2013: completed radiation therapy, started on letrozole 3) Feb 2018: completed letrozole therapy   HISTORY OF PRESENTING ILLNESS:  [name] 84 y.o. female with medical history significant for CAD s/p CABG in 2011,  OSA on CPAP, Breast Cancer, and GERD who presents for a follow up for adenocarcinoma of the pancreas. She was last seen in our clinic on 07/19/2019. In the interim she developed a marked anemia and elevation in LFTs which prompted admission to [facility] hospital. She would found to have stent obstruction and underwent ERCP with new stent placement on 08/25/2019.   On exam today [name] reports that she has had no signs of bleeding since her discharge from the hospital.  She has that she is had no dark bowel movements and no episodes of hematemesis.  She notes that her stools are of lighter color and they tend to be yellowish or green.  She also notes that she has not been able to sleep well since Sunday night.  She reports that she has been taking her Tylenol and tramadol with very little relief and frequent awakening from sleep.  She notes that her pain can escalate to 8 out of 10 in severity and can occasionally go up to 9 out of 10.  She is open to trying increased dose of the tramadol versus oxycodone to better control her pain.  Otherwise she notes that her appetite is good and her energy levels are rebounding from her hospitalization.  She denies having any issues with fevers, chills, sweats, nausea, vomiting or diarrhea.  A full 10 point ROS is listed below.   MEDICAL HISTORY:  Past Medical History:  Diagnosis Date  . Asthma   . Atrophic vaginitis   . Breast cancer, Left 12/20/2010   NO BLOOD PRESSURE CHECKS OR STICKS IN LEFT ARM  . CAD (coronary artery disease)    a. 01/19/2010 s/p CABG x 3, lima->lad, vg->diag, vg->om1;  b. 06/2011 :  Lexi MV: EF 84%, No ischemia. c. 01/06/14 s/p negative nuclear stress test with EF >70%  . Cervical arthritis   . Colonic polyp 04-27-2009   tubular adenoma  . Diverticulosis of colon (without mention of hemorrhage)   . Fibromyalgia   . Gallstones   . GERD (gastroesophageal reflux disease)   . Glaucoma   . Headache(784.0)    a. frequently assocaited with high BPs   . Hiatal hernia   . Hypercholesterolemia   . Irritable bowel syndrome   . Labile hypertension   . Lymphedema    left arm  . Pancreatic cancer ([place]) dx'd 11/2018  . Pinched vertebral nerve   . Secondary diabetes ([place]) 12/03/2018  . Sleep apnea     SURGICAL HISTORY: Past Surgical History:  Procedure Laterality Date  . [place] STUDY N/A 01/08/2016   Procedure: [place] STUDY;  Surgeon: Ladene Artist, MD;  Location: [facility] ENDOSCOPY;  Service: Endoscopy;  Laterality: N/A;  . BILIARY BRUSHING  11/28/2018   Procedure: BILIARY BRUSHING;  Surgeon: Gatha Mayer, MD;  Location: [facility] ENDOSCOPY;  Service: Endoscopy;;  . BILIARY DILATION  01/06/2019   Procedure: BILIARY DILATION;  Surgeon: Irving Copas., MD;  Location: [facility] ENDOSCOPY;  Service: Endoscopy;;  . BILIARY STENT PLACEMENT  11/28/2018   Procedure: BILIARY STENT PLACEMENT;  Surgeon: Gatha Mayer, MD;  Location: [facility] ENDOSCOPY;  Service: Endoscopy;;  . BILIARY STENT PLACEMENT N/A 01/06/2019   Procedure: BILIARY STENT PLACEMENT;  Surgeon: Irving Copas., MD;  Location: [facility] ENDOSCOPY;  Service: Endoscopy;  Laterality: N/A;  . BILIARY STENT PLACEMENT N/A 08/25/2019   Procedure: BILIARY STENT PLACEMENT;  Surgeon: Jackquline Denmark, MD;  Location: [facility] ENDOSCOPY;  Service: Endoscopy;  Laterality: N/A;  . BREAST LUMPECTOMY Left 02/06/11  . CATARACT EXTRACTION, BILATERAL     bilateral caaract removal,  . COLONOSCOPY    . CORONARY ANGIOPLASTY WITH STENT PLACEMENT     Stent 2007  . CORONARY ARTERY BYPASS GRAFT    . ENDOSCOPIC RETROGRADE CHOLANGIOPANCREATOGRAPHY (ERCP) WITH PROPOFOL N/A 01/06/2019   Procedure: ENDOSCOPIC RETROGRADE CHOLANGIOPANCREATOGRAPHY (ERCP) WITH PROPOFOL;  Surgeon: Rush Landmark Telford Nab., MD;  Location: [facility] ENDOSCOPY;  Service: Endoscopy;  Laterality: N/A;  [id number]  . ERCP N/A 11/28/2018   Procedure: ENDOSCOPIC RETROGRADE CHOLANGIOPANCREATOGRAPHY (ERCP);  Surgeon: Gatha Mayer, MD;  Location: [facility] ENDOSCOPY;   Service: Endoscopy;  Laterality: N/A;  . ERCP N/A 08/25/2019   Procedure: ERCP with stent  ;  Surgeon: Jackquline Denmark, MD;  Location: [facility] ENDOSCOPY;  Service: Endoscopy;  Laterality: N/A;  . ESOPHAGEAL MANOMETRY N/A 01/08/2016   Procedure: ESOPHAGEAL MANOMETRY (EM);  Surgeon: Ladene Artist, MD;  Location: [facility]OS[facility];  Service: Endoscopy;  Laterality: N/A;  . ESOPHAGOGASTRODUODENOSCOPY    . ESOPHAGOGASTRODUODENOSCOPY N/A 01/06/2019   Procedure: ESOPHAGOGASTRODUODENOSCOPY (EGD);  Surgeon: Rush Landmark Telford Nab., MD;  Location: [facility] END[facility];  Service: Endoscopy;  Laterality: N/A;  . ESOPHAGOGASTRODUODENOSCOPY N/A 08/25/2019   Procedure: ESOPHAGOGASTRODUODENOSCOPY (EGD);  Surgeon: Jackquline Denmark, MD;  Location: [facility] ENDOSCOPY;  Service: Endoscopy;  Laterality: N/A;  . ESOPHAGOGASTRODUODENOSCOPY (EGD) WITH PROPOFOL N/A 12/10/2018   Procedure: ESOPHAGOGASTRODUODENOSCOPY (EGD) WITH PROPOFOL;  Surgeon: [name], MD;  [facility] END[facility];  Service: Endoscopy;  Laterality: N/A;  . EUS N/A 12/10/2018   Procedure: UPPER ENDOSCOPIC ULTRASOUND (EUS) RADIAL;  Surgeon: [name], MD;  Location: [facility] END[facility];  Service: Endoscopy;  Laterality: N/A;  . EUS N/A 01/06/2019   Procedure: FULL UPPER ENDOSCOPIC ULTRASOUND (EUS) RADIAL;  Surgeon: Rush Landmark Telford Nab., MD;  Location: [facility] ENDOS[facility];  Service: Endoscopy;  Laterality: N/A;  . FIDUCIAL MARKER PLACEMENT  01/06/2019   Procedure: FIDUCIAL MARKER PLACEMENT;  Surgeon: Rush Landmark Telford Nab., MD;  Location: [facility] ENDOS[facility];  Service: Endoscopy;;  . FINE NEEDLE ASPIRATION N/A 12/10/2018   Procedure: FINE NEEDLE ASPIRATION (FNA) LINEAR;  Surgeon: [name], MD;  Location: [facility] ENDOS[facility];  Service: Endoscopy;  Laterality: N/A;  . REMOVAL OF STONES  08/25/2019   Procedure: REMOVAL OF STONES;  Surgeon: Jackquline Denmark, MD;  Location: [facility] ENDOSCOPY;  Service: Endoscopy;;  . [name]  01/06/2019   Procedure: [name];  Surgeon: Mansouraty,  Telford Nab., MD;  Location: [facility] ENDOSCOPY;  Service: Endoscopy;;  . STENT REMOVAL  01/06/2019   Procedure: STENT REMOVAL;  Surgeon: Irving Copas., MD;  Location: [facility] ENDOSCOPY;  Service: Endoscopy;;    ALLERGIES:  is allergic to choline fenofibrate, hctz [hydrochlorothiazide], simvastatin, levaquin [levofloxacin in d5w], adhesive [tape], bentyl [dicyclomine hcl], ceclor [cefaclor], clarithromycin, codeine, doxycycline, hydrocodone, lisinopril, penicillins, and tobramycin-dexamethasone.  MEDICATIONS:  Current Outpatient Medications  Medication Sig Dispense Refill  . acetaminophen (TYLENOL) 325 MG tablet Take 650 mg by mouth every 6 (six) hours as needed.    [name] albuterol (VENTOLIN HFA) 108 (90 Base) MCG/ACT inhaler Inhale 2 puffs into the lungs every 6 (six) hours as needed for wheezing or shortness of breath.     [name] amLODipine (NORVASC) 5 MG tablet Take 1 tablet (5 mg total) by mouth daily. (Patient taking differently: Take 5 mg by mouth every evening. ) 90 tablet 3  . amoxicillin-clavulanate (AUGMENTIN) 875-125 MG tablet Take 1 tablet by mouth every 12 (twelve) hours for 7 days. 14 tablet 0  . CALCIUM PO Take 1 tablet by mouth daily with breakfast.     . EPINEPHrine 0.3 mg/0.3 mL IJ SOAJ injection Inject 0.3 mg into the muscle as needed (as directed- FOR A SEVERE REACTION).     [name] fexofenadine (ALLEGRA) 180 MG tablet Take 180 mg by mouth every other day.     . fluticasone (FLONASE) 50 MCG/ACT nasal spray Place 1-2 sprays into both nostrils daily as needed for allergies or rhinitis.    . Glucosamine-Chondroit-Vit C-Mn (GLUCOSAMINE CHONDR 1500 COMPLX PO) Take 1 tablet by mouth 2 (two) times daily.     . hydrALAZINE (APRESOLINE) 50 MG tablet Take 0.5 tablets (25 mg total) by mouth 3 (three) times daily. (Patient taking differently: Take 25 mg by mouth daily. ) 135 tablet 1  . latanoprost (XALATAN) 0.005 % ophthalmic solution Place 1 drop into both eyes at bedtime.     [name] losartan (COZAAR) 100  MG tablet TAKE 1/2 TABLET BY MOUTH THREE TIMES A DAY (Patient taking differently: Take 50 mg by mouth daily. ) 90 tablet 0  . metoprolol tartrate (LOPRESSOR) 50 MG tablet Take 1 tablet (50 mg total) by mouth 3 (three) times daily. 270 tablet 3  . nitroGLYCERIN (NITROSTAT) 0.4 MG SL tablet Place 1 tablet (0.4 mg total) under the tongue every 5 (five) minutes as needed for chest pain. 25 tablet 2  . pantoprazole (PROTONIX) 40 MG tablet Take 1 tablet (40 mg total) by mouth daily. 30 tablet 0  . Psyllium (METAMUCIL FIBER PO) Take 1 Scoop by mouth daily. Mix one rounded teaspoonful into 8 oz water and drink once a day    . rosuvastatin (CRESTOR) 5 MG tablet TAKE 1 TABLET BY MOUTH 3 TIMES A WEEK. (TAKE ON MONDAY, WEDNESDAY, AND FRIDAY) (Patient taking differently: Take 5 mg by mouth every Monday, Wednesday, and  Friday. ) 36 tablet 3  . simethicone (MYLICON) 462 MG chewable tablet Chew 125 mg by mouth every 6 (six) hours as needed for flatulence.    . sodium chloride (OCEAN) 0.65 % SOLN nasal spray Place 1 spray into both nostrils daily as needed for congestion.     . sucralfate (CARAFATE) 1 g tablet TAKE 1 TABLET BY MOUTH BY MOUTH TWICE DAILY(ONE BEFORE DINNER) (Patient taking differently: Take 1 g by mouth 2 (two) times daily. ) 60 tablet 1  . traMADol (ULTRAM) 50 MG tablet Take 1 tablet (50 mg total) by mouth every 6 (six) hours as needed for severe pain. 10 tablet 0  . vitamin B-12 (CYANOCOBALAMIN) 1000 MCG tablet Take 1,000 mcg by mouth daily.      [name] VITAMIN D PO Take 200 mcg by mouth daily with breakfast. Vitamin A  300 mcg Calcium  40 mcg     No current facility-administered medications for this visit.    REVIEW OF SYSTEMS:   Constitutional: ( - ) fevers, ( - )  chills , ( - ) night sweats Eyes: ( - ) blurriness of vision, ( - ) double vision, ( - ) watery eyes Ears, nose, mouth, throat, and face: ( - ) mucositis, ( - ) sore throat Respiratory: ( - ) cough, ( - ) dyspnea, ( - )  wheezes Cardiovascular: ( - ) palpitation, ( - ) chest discomfort, ( - ) lower extremity swelling Gastrointestinal:  ( + ) nausea, ( - ) heartburn, ( - ) change in bowel habits Skin: ( - ) abnormal skin rashes Lymphatics: ( - ) new lymphadenopathy, ( - ) easy bruising Neurological: ( - ) numbness, ( - ) tingling, ( - ) new weaknesses Behavioral/Psych: ( - ) mood change, ( - ) new changes  All other systems were reviewed with the patient and are negative.  PHYSICAL EXAMINATION: ECOG PERFORMANCE STATUS: 2 - Symptomatic, <50% confined to bed  Vitals:   09/01/19 1055  BP: (!) 154/57  Pulse: (!) 57  Resp: 16  Temp: (!) 97.2 F (36.2 C)  SpO2: 97%   Filed Weights   09/01/19 1055  Weight: 119 lb 1.6 oz (54 kg)    GENERAL: pleasant, well appearing elderly Caucasian female in NAD  SKIN: skin color, texture, turgor are normal, no rashes or significant lesions EYES: conjunctiva are pink and non-injected. No jaundice evident in the sclera.  LUNGS: clear to auscultation and percussion with normal breathing effort HEART: regular rate & rhythm and no murmurs and no lower extremity edema ABDOMEN: soft and non-distended.  Musculoskeletal: no cyanosis of digits and no clubbing  PSYCH: alert & oriented x 3, fluent speech NEURO: no focal motor/sensory deficits  LABORATORY DATA:  I have reviewed the data as listed CMP Latest Ref Rng & Units 09/01/2019 08/28/2019 08/27/2019  Glucose 70 - 99 mg/dL 183(H) 99 119(H)  BUN 8 - 23 mg/dL '9 12 14  '$ Creatinine 0.44 - 1.00 mg/dL 0.76 0.53 0.53  Sodium 135 - 145 mmol/L 135 135 137  Potassium 3.5 - 5.1 mmol/L 3.9 3.5 3.7  Chloride 98 - 111 mmol/L 98 98 99  CO2 22 - 32 mmol/L '28 29 26  '$ Calcium 8.9 - 10.3 mg/dL 9.3 8.6(L) 8.6(L)  Total Protein 6.5 - 8.1 g/dL 7.2 6.2(L) 6.4(L)  Total Bilirubin 0.3 - 1.2 mg/dL 0.6 1.5(H) 3.2(H)  Alkaline Phos 38 - 126 U/L 136(H) 141(H) 164(H)  AST 15 - 41 U/L 21 24 41  ALT 0 -  44 U/L 37 85(H) 129(H)   CBC Latest Ref Rng &  Units 09/01/2019 08/28/2019 08/27/2019  WBC 4.0 - 10.5 K/uL 5.2 6.0 9.1  Hemoglobin 12.0 - 15.0 g/dL 11.3(L) 11.3(L) 10.2(L)  Hematocrit 36 - 46 % 34.2(L) 33.9(L) 30.0(L)  Platelets 150 - 400 K/uL 294 202 186    RADIOGRAPHIC STUDIES:  CLINICAL DATA:  Epigastric abdominal pain. Blood in stool. Pancreatic adenocarcinoma. Remote history of breast cancer.  EXAM: CT ABDOMEN AND PELVIS WITH CONTRAST  TECHNIQUE: Multidetector CT imaging of the abdomen and pelvis was performed using the standard protocol following bolus administration of intravenous contrast.  CONTRAST:  149m OMNIPAQUE IOHEXOL 300 MG/ML  SOLN  COMPARISON:  08/03/2019  FINDINGS: Lower chest: Stable scarring or subsegmental atelectasis in the lung bases. Centrilobular emphysema. Old granulomatous disease. Descending thoracic aortic atherosclerotic calcification.  Hepatobiliary: Pneumobilia. Biliary stent remains in place. No compelling findings of metastatic disease to the liver. No overt gallbladder wall thickening.  Pancreas: Prominently dilated dorsal pancreatic duct similar to prior, extending to an infiltrative mass in the head of the pancreas which surrounds the stent. Several fiducials remain in place. Overall the pancreatic mass does not appear significantly changed, measuring roughly 3.1 by 2.8 cm on image 26/2. Mild surrounding inflammatory stranding along the pancreatic head.  Spleen: Unchanged with 3 small hypodense lesions which are probably benign but technically nonspecific.  Adrenals/Urinary Tract: Unremarkable  Stomach/Bowel: Indistinctness of tissue planes around the pancreatic head and adjacent portions of the duodenum, pancreatitis and duodenitis not exclude and some of this may be related to prior radiation therapy. Overall the degree of stranding around the descending and transverse duodenum is similar to 08/03/2019 and no dilation of the stomach or bowel is observed.  Sigmoid  colon diverticulosis is present. No active diverticulitis identified.  Vascular/Lymphatic: The pancreatic head mass narrows the confluence of the splenic vein and the SMV into the portal vein. Aortoiliac atherosclerotic vascular disease. Stable prominence of parametrial venous structures, left greater than right, pelvic congestion syndrome not excluded.  Reproductive: Unremarkable  Other: Low-grade stranding in the central mesentery, minimally increased from prior, nonspecific.  Musculoskeletal: Degenerative arthropathy of both hips. Grade 1 degenerative anterolisthesis at L4-5. Multilevel lumbar spondylosis and degenerative disc disease with left greater than right foraminal impingement at L5-S1. central narrowing of the thecal sac at the L4-5 level.  IMPRESSION: 1. Similar appearance of the infiltrative mass in the head of the pancreas with surrounding fiducials. The mass narrows the confluence of the splenic vein and the SMV into the portal vein. 2. Indistinctness of tissue planes around the pancreatic head and adjacent portions of the duodenum, pancreatitis and duodenitis could cause this appearance, and radiation therapy in the past may be predisposing. 3. Other imaging findings of potential clinical significance: Sigmoid colon diverticulosis. Stable prominence of parametrial venous structures, left greater than right, pelvic congestion syndrome not excluded. Multilevel lumbar spondylosis and degenerative disc disease causing left greater than right foraminal impingement at L5-S1. 4. Emphysema and aortic atherosclerosis.  Aortic Atherosclerosis (ICD10-I70.0) and Emphysema (ICD10-J43.9).   Electronically Signed   By: WVan ClinesM.D.   On: 08/24/2019 14:58  ASSESSMENT & PLAN [name] FRISKE89y.o. female with medical history significant for CAD s/p CABG in 2011, OSA on CPAP, Breast Cancer, and GERD who presents for a follow up for localized  adenocarcinoma of the pancreas.  After review the labs and discussion with Mrs. VRiccardiher clinical status appears to be improving from her hospitailzation.  Her bilirubin and  LFTs are normalizing and her Hgb is rising. Her pain is increasing and for that we will prescribe oxycodone '5mg'$  PO as needed q6H.    We will continue with symptom management and close monitoring of her labs today give her a better idea of life expectancy/progression of disease. We will see her back in 6 weeks time for continued monitoring (6 weeks per her request).   #Localized Adenocarcinoma of the Pancreas, Stage IA (T1N0M0) --Completed palliatve radiation therapy on 03/18/2019  --after reviewing patients functional status and ECOG 2 I think the patient is possible a candidate for chemotherapy (likely only monotherapy gemcitabine (J Clin Oncol. 1997 Jun; 15(6):2403-13.)). This could be considered after radiatiation, though I do not favor that approach. The patient and her daughter are OK with continued comfort based care. --last CT scan on 08/24/2019 showed increase in size of tumor (3.1 by 2.8 cm).  --for now continued supportive care and symptom management.  --RTC in 6 weeks to assess symptoms.   #Breast Cancer T2 N0, stage IIA invasive ductal carcinoma, grade 3,  ER+/PR+/HER-2 not amplified --completed 84 year old letrozole therapy in Feb 2018 --previously followed by Dr. Gunnar Bulla Magrinat at [facility] --in remission   #Cancer Related Pain, worsening --worsening pain today, still across the top of the abdomen. --now taking [id number] mg tylenol PRN and tramadol '50mg'$  PO q8H PRN.  --patient can increase tramadol to '100mg'$  PO at night, and we will prescribe oxycodone '5mg'$  PO q6H PRN if that is ineffective --LFTs have normalized, OK to take tylenol at above dosage --will continue to monitor abdominal pain  #Symptom Management --notes light colored stools that float, but no diarrhea. Possible initial signs of pancreatic  insufficiency. Continue to monitor and consider pancreatic enzymes vs low fat diet.  --no itching or jaundice today. Continue to monitor. If worsening, could be sign of stent obstruction.  --mild nausea, at patient's baseline. Treated with ginger. No diarrhea. Will provide zofran '8mg'$  PO PRN for nausea.   #Goals Of Care --patient notes she wants to maintain a high quality of life and does not think that surgery/chemotherapy would allow that. I agree with her assessment --referred to [facility] for introductions, assistance with symptoms management, and preliminary hospice discussions.   All questions were answered. The patient knows to call the clinic with any problems, questions or concerns.  A total of more than 30 minutes were spent face-to-face with the patient during this encounter and over half of that time was spent on counseling and coordination of care as outlined above.   Ledell Peoples, MD Department of Hematology/Oncology [place] at [facility] Phone: [phone] Pager: [phone] Email: [email or web address]   09/01/2019 11:24 AM   Literature Support:  Nyra Capes, Switchenko Hewitt Shorts RJ, Piqua, [place] T, et al. Outcomes for patients with locally advanced pancreatic adenocarcinoma treated with stereotactic body radiation therapy versus conventionally fractionated radiation. Cancer. 2017;123(18):3486-93.  --Among 8450 patients, 7819 (92.5%) were treated with CFRT, and 631 (7.5%) underwent SBRT. Receipt of SBRT was associated with superior OS in the multivariate analysis (hazard ratio, 0.84; 95% confidence interval, 0.75?0.93; P?<?.001). With propensity score matching, 988 patients in all were matched, with 494 patients in each cohort. Within the propensity?matched cohorts, the median OS (13.9 vs 11.6 months) and the 2?year OS rate (21.7% vs 16.5%) were significantly higher with SBRT versus CFRT  (P?=?.0014).

## 2019-09-02 ENCOUNTER — Telehealth: Payer: Self-pay | Admitting: *Deleted

## 2019-09-02 NOTE — Telephone Encounter (Signed)
Called and spoke with patient to schedule a Palliative care home visit. Visit scheduled for 09/07/19@1 :30p.

## 2019-09-03 MED ORDER — TRAMADOL HCL 50 MG PO TABS: 50.0000 mg | ORAL_TABLET | Freq: Four times a day (QID) | ORAL | 0 refills | Status: DC | PRN

## 2019-09-03 MED ORDER — ONDANSETRON HCL 8 MG PO TABS: 8.0000 mg | ORAL_TABLET | Freq: Three times a day (TID) | ORAL | 1 refills | Status: AC | PRN

## 2019-09-03 MED FILL — ONDANSETRON HCL 8 MG TABLET: 8 | 10 days supply | Qty: 30 | Fill #0

## 2019-09-03 MED FILL — traMADol HCL 50 MG TABS: 50 | 5 days supply | Qty: 40 | Fill #0

## 2019-09-06 ENCOUNTER — Telehealth: Payer: Self-pay | Admitting: Cardiovascular Disease

## 2019-09-06 ENCOUNTER — Telehealth: Payer: Self-pay | Admitting: *Deleted

## 2019-09-06 ENCOUNTER — Other Ambulatory Visit: Payer: Self-pay | Admitting: Hematology and Oncology

## 2019-09-06 NOTE — Telephone Encounter (Signed)
Daughter called for patient. Patient was told by her oncologist to ask Dr. Johnsie Cancel if she needs to go back on ASA. Patient's daughter stated patient has pancreatic cancer and the tumor it bigger and they are looking at hospice in the next couple of months. Patient's daughter does not want patient to take any medications that is not neccessary due to patient's recent GI bleed and progression of her cancer. Patient has stopped taking her Crestor already, will update patient's medication list. Will forward to Dr. Johnsie Cancel for further advisement.

## 2019-09-06 NOTE — Telephone Encounter (Signed)
New message   Pt c/o medication issue:  1. Name of Medication: Aspirin 81 mg  2. How are you currently taking this medication (dosage and times per day)? As written  3. Are you having a reaction (difficulty breathing--STAT)? No   4. What is your medication issue? Patient daughter needs to know if to continue to take medication. Please advise.

## 2019-09-06 NOTE — Telephone Encounter (Signed)
Patients daughter is aware

## 2019-09-06 NOTE — Telephone Encounter (Signed)
Can stop ASA

## 2019-09-06 NOTE — Telephone Encounter (Signed)
Received call from patient. She states she has been having diarrhea on and off over the weekend-more than 4 or 5 times a day. She has been taking pepto bismal without any relief. She is taking in at least 64 oz of fluid per day. Advised to start Imodium -instructed on use of Imodium.  Encouraged oral fluids to prevent dehydration.  She also asked about refills on her Tramadol and the new prescription for anti-emetic. Advised that both of these were called in to [facility] on 09/03/19. Confirmed with pharmacy that they had those prescrip[tions and they are ready to be picked up. Pt voiced understanding to all of the above instructions.

## 2019-09-07 ENCOUNTER — Other Ambulatory Visit: Payer: Self-pay

## 2019-09-07 ENCOUNTER — Encounter: Payer: Self-pay | Admitting: Gastroenterology

## 2019-09-07 ENCOUNTER — Other Ambulatory Visit: Payer: Medicare Other | Admitting: *Deleted

## 2019-09-07 ENCOUNTER — Ambulatory Visit (INDEPENDENT_AMBULATORY_CARE_PROVIDER_SITE_OTHER): Payer: Medicare Other | Admitting: Gastroenterology

## 2019-09-07 VITALS — BP 110/50 | HR 64 | Ht <= 58 in | Wt 121.0 lb

## 2019-09-07 DIAGNOSIS — K922 Gastrointestinal hemorrhage, unspecified: Secondary | ICD-10-CM | POA: Diagnosis not present

## 2019-09-07 DIAGNOSIS — C259 Malignant neoplasm of pancreas, unspecified: Secondary | ICD-10-CM | POA: Diagnosis not present

## 2019-09-07 DIAGNOSIS — Z515 Encounter for palliative care: Secondary | ICD-10-CM

## 2019-09-07 NOTE — Patient Instructions (Signed)
Stop taking your Augmentin.   Continue Protonix and IBgard.  Please follow up with Dr. Fuller Plan in 2 months.   Thank you for choosing me and [facility].  Pricilla Riffle. Dagoberto Ligas., MD., Marval Regal

## 2019-09-07 NOTE — Progress Notes (Signed)
    History of Present Illness: This is an 84 year old female with pancreatic adenocarcinoma stage IA.  She is accompanied by her daughter. She has a biliary stent in place for obstructive jaundice.  She was hospitalized in late June for epigastric pain, GI bleeding and cholangitis felt secondary to hemobilia from tumor ingrowth.  A covered metal mesh stent was placed inside an uncovered metal stent.  She was treated with IV antibiotics and changed to oral Augmentin at discharge.  She reports frequent epigastric pain abdominal pain for months. She had mild diarrhea yesterday that responded to Imodium. Intermittent mild lower abdominal pain improves with IBgard  Current Medications, Allergies, Past Medical History, Past Surgical History, Family History and Social History were reviewed in [employer] record.   Physical Exam: General: Well developed, well nourished, no acute distress Head: Normocephalic and atraumatic Eyes:  sclerae anicteric, EOMI Ears: Normal auditory acuity Mouth: Not examined, mask on during Covid-19 pandemic Lungs: Clear throughout to auscultation Heart: Regular rate and rhythm; no murmurs, rubs or bruits Abdomen: Soft, non tender and non distended. No masses, hepatosplenomegaly or hernias noted. Normal Bowel sounds Rectal: Not done Musculoskeletal: Symmetrical with no gross deformities  Pulses:  Normal pulses noted Extremities: No clubbing, cyanosis, edema or deformities noted Neurological: Alert oriented x 4, grossly nonfocal Psychological:  Alert and cooperative. Normal mood and affect   Assessment and Recommendations:  1. Pancreatic adenocarcinoma with covered metal mesh biliary stent inside an uncovered metal mesh stent. Recent UGI bleed felt secondary to hemobilia from tumor ingrowth and recent cholangitis have resolved. Mild diarrhea likely from Augmentin. DC Augmentin. LFTs improved. Hgb stable at 11.3. REV 2 months.   2. IBS. IBgard 1-2  po tid prn.

## 2019-09-14 MED FILL — PANTOPRAZOLE SOD DR 40 MG T: 40 | 30 days supply | Qty: 30 | Fill #0

## 2019-09-16 ENCOUNTER — Encounter: Payer: Self-pay | Admitting: Neurology

## 2019-09-16 ENCOUNTER — Ambulatory Visit (INDEPENDENT_AMBULATORY_CARE_PROVIDER_SITE_OTHER): Payer: Medicare Other | Admitting: Neurology

## 2019-09-16 ENCOUNTER — Other Ambulatory Visit: Payer: Self-pay

## 2019-09-16 VITALS — BP 122/56 | HR 60 | Ht <= 58 in | Wt 120.0 lb

## 2019-09-16 DIAGNOSIS — G4733 Obstructive sleep apnea (adult) (pediatric): Secondary | ICD-10-CM

## 2019-09-16 DIAGNOSIS — Z9989 Dependence on other enabling machines and devices: Secondary | ICD-10-CM

## 2019-09-16 NOTE — Patient Instructions (Signed)
It is always good to see you.  I hope you continue to do well and stay strong. You have always been compliant with your CPAP therapy, as discussed, we can try to reduce the pressure to 4 cm for better tolerance.  I think, for your convenience, it would be good if we could do a virtual visit next time, we will arrange for a MyChart virtual follow-up visit in 6 months with me.  One of your daughters her grandchildren may be able to help you with the visit set up.

## 2019-09-16 NOTE — Progress Notes (Signed)
Subjective:    Patient ID: [name] is a 84 y.o. female.  HPI     Interim history:  [name] is a very pleasant 84 year old right-handed woman with an underlying medical history of fibromyalgia, coronary artery disease, status post CABG, irritable bowel syndrome, diverticulosis, breast cancer, hyperlipidemia, glaucoma, hypertension, overweight state, and pancreatic cancer, who presents for follow-up consultation of her obstructive sleep apnea, on treatment with CPAP, for her yearly check up. The patient is unaccompanied today. I last saw her on 08/20/2018, at which time she was compliant with her CPAP.  She was advised to follow-up in 1 year.  Today, 09/16/2019: She reports, doing reasonably well.  She had some nausea and indigestion today, unfortunately, she has been diagnosed with pancreatic cancer.  She had itching and initial symptoms in or around September 2020 and had jaundice.  Work-up revealed a pancreatic head cancer.  She had 4 treatments with radiation in January 2021.  Altogether, she had her third ERCP in June.  Chemotherapy has been discussed and she has not decided to proceed with it.  This has been of course a major stressor in her life and her family, while being very supportive, is obviously also stressed over this.  In addition, her husband of 29 years had Covid in December and it was touching go for him.  On the positive side, they celebrated their 60th anniversary this month.  She also turned 30 recently and had a good family get together recently.  She is hanging in there. Sometimes she wakes up with a dry mouth and excess mucus.  She tolerates the CPAP, does not love it but is motivated to continue with it. I reviewed her CPAP compliance data from 08/16/2019 through 09/14/2019, which is a total of 30 days, during which time she used her machine 26 days, residual AHI at goal, average usage of nearly 6-1/2 hours, leak acceptable, pressure of 6.  She was hospitalized in late  June, underwent ERCP and this explains the gap in her treatment.  She has lost weight.    The patient's allergies, current medications, family history, past medical history, past social history, past surgical history and problem list were reviewed and updated as appropriate.    Previously (copied from previous notes for reference):    I saw her on 05/21/2017, at which time she was fully compliant with her CPAP and advised to follow-up in 1 year.    I reviewed her CPAP compliance data from 07/20/2018 through 08/18/2018 which is a total of 30 days, during which time she used her machine every night with percent used days greater than 4 hours at 97%, indicating excellent compliance with an average usage of 7 hours and 22 minutes, residual AHI at goal at 0.5/h, leak on the low side with a 95th percentile at 4.1 L/min on a pressure of 6 cm with EPR.      I saw her on 04/25/2017, at which time she was compliant with CPAP therapy. She reported that she sometimes would forget to put the mask back on but generally she was doing okay. She was finishing her tamoxifen after being on it for 5 years. She had some weight loss.    I reviewed her CPAP compliance data from 04/20/2017 through 05/19/2017 which is a total of 30 days, during which time she used her machine every night with percent used days greater than 4 hours at 100%, indicating superb compliance with an average usage of 7 hours and 30  minutes, residual AHI at goal at 0.6 per hour, leak from the very low side with the 95th percentile at 0.7 L/m on a pressure of 6 cm with EPR of 3.    I saw her on 04/26/2015, at which time she was compliant with CPAP therapy, she was doing fairly well. We talked about her sleep study results from 04/25/2014, she had trigger point injection under Dr. Everlena Cooper, was referred to physical therapy and had 2 treatments with dry needling. I suggested a new checkup from the sleep apnea standpoint. She had an appointment pending with  Dr. Karel Jarvis.    I reviewed her CPAP compliance data from 03/25/2016 through 04/23/2016, which is a total of 30 days, during which time she used her CPAP every night with percent used days greater than 4 hours at 93%, indicating excellent compliance with an average usage of 6 hours and 49 minutes, residual AHI low at 0.5 per hour, leak low for the 95th percentile at 1.4 L/m on a pressure of 6 cm with EPR.    I first met her on 10/27/2014 at the request of Dr. Terrace Arabia and Darrol Angel, at which time the patient was advised regarding her split-night sleep study results from 04/25/2014. She had evidence of moderate obstructive sleep apnea. She did well with CPAP. She was compliant with CPAP therapy at the time and indicated improvement of her sleep including feeling better rested and having less daytime somnolence. Headaches were about the same and she was supposed to start seeing Dr. Karel Jarvis for this.     I reviewed her CPAP compliance data from 03/26/2015 through 04/24/2015 which is a total of 30 days during which time she used her machine every night with percent used days greater than 4 hours at 100%, indicating superb compliance with an average usage of 7 hours and 1 minute, residual AHI low at 0.6 per hour, leak low with the 95th percentile at 0.9 L/m at a pressure of 6 cm with EPR.   10/27/2014: She was referred for a sleep study. She reported morning headaches, nocturia, nonrestorative sleep and excessive daytime somnolence as well as snoring. She had a split-night sleep study on 04/25/2014 and I went over her test results with her in detail today. Her baseline sleep efficiency was reduced at 53.5% with a latency to sleep of 42.5 minutes and wake after sleep onset of 61 minutes with moderate to severe sleep fragmentation noted. She had a markedly elevated arousal index. She had an increased percentage of light stage sleep and absence of slow-wave sleep and REM sleep. She had PLMS at 62 per hour, with minimal  arousals. She had mild to moderate snoring. Total AHI was 16.1 per hour. Average oxygen saturation was 95%, nadir was 85%. She was then titrated on CPAP. Sleep efficiency was 83.3%, latency to sleep 9 minutes and wake after sleep onset 16.5 minutes with mild sleep fragmentation noted. She had slow-wave sleep at 8.3% in rem sleep at 25.6%. Average oxygen saturation was 98%, nadir was 96%. Snoring was eliminated. She had a PLM index of 43.5 per hour with no arousals. She was titrated from 5 cm to 6 cm. AHI was 0 per hour on the final pressure with supine REM sleep achieved. Based on her test results are prescribed CPAP therapy for home use.   I reviewed her CPAP compliance data from 09/26/2014 through 10/25/2014 which is a total of 30 days during which time she used her machine every night with percent used days  greater than 4 hours at 97%, indicating excellent compliance with an average usage of 7 hours and 4 minutes, residual AHI low at syrup 0.5 per hour, leak low with the 95th percentile at 3.8 L/m on a pressure of 6 cm.   She reports doing fairly well. Overall, she feels a little bit better rested when she uses CPAP and still is struggling somewhat with the mask. She uses nasal pillows. She started CPAP therapy in mid June. She had some questions about the machine and how to clean the house and so forth and I tried to answer them but I also asked her to get in touch with her DME company, [employer]. As far as her bedtime goes, she tries to be in bed between 10 and 10:30. Her rise time is 6 to 6:30 AM. She used to have restless leg symptoms even as a child. Her husband has reported to her that her twitching has improved. She does not endorse bothersome restless leg symptoms at this time. Her headaches are about the same. Her nocturia is still about the same, 2-3 times in an average night. She does not drink alcohol and does not drink caffeine on a regular basis and is a nonsmoker. She lives with her husband. She  has 2 grown daughters and 4 granddaughters.  Her Past Medical History Is Significant For: Past Medical History:  Diagnosis Date  . Asthma   . Atrophic vaginitis   . Breast cancer, Left 12/20/2010   NO BLOOD PRESSURE CHECKS OR STICKS IN LEFT ARM  . CAD (coronary artery disease)    a. 01/19/2010 s/p CABG x 3, lima->lad, vg->diag, vg->om1;  b. 06/2011 :Lexi MV: EF 84%, No ischemia. c. 01/06/14 s/p negative nuclear stress test with EF >70%  . Cervical arthritis   . Colonic polyp 04-27-2009   tubular adenoma  . Diverticulosis of colon (without mention of hemorrhage)   . Fibromyalgia   . Gallstones   . GERD (gastroesophageal reflux disease)   . Glaucoma   . Headache(784.0)    a. frequently assocaited with high BPs  . Hiatal hernia   . Hypercholesterolemia   . Irritable bowel syndrome   . Labile hypertension   . Lymphedema    left arm  . Pancreatic cancer ([place]) dx'd 11/2018  . Pinched vertebral nerve   . Secondary diabetes ([place]) 12/03/2018  . Sleep apnea     Her Past Surgical History Is Significant For: Past Surgical History:  Procedure Laterality Date  . [place] STUDY N/A 01/08/2016   Procedure: [place] STUDY;  Surgeon: Ladene Artist, MD;  Location: [facility] ENDOSCOPY;  Service: Endoscopy;  Laterality: N/A;  . BILIARY BRUSHING  11/28/2018   Procedure: BILIARY BRUSHING;  Surgeon: Gatha Mayer, MD;  Location: [facility] ENDOSCOPY;  Service: Endoscopy;;  . BILIARY DILATION  01/06/2019   Procedure: BILIARY DILATION;  Surgeon: Irving Copas., MD;  Location: [facility]OSCOPY;  Service: Endoscopy;;  . BILIARY STENT PLACEMENT  11/28/2018   Procedure: BILIARY STENT PLACEMENT;  Surgeon: Gatha Mayer, MD;  Location: [facility] ENDOSCOPY;  Service: Endoscopy;;  . BILIARY STENT PLACEMENT N/A 01/06/2019   Procedure: BILIARY STENT PLACEMENT;  Surgeon: Irving Copas., MD;  Location: [facility]OS[facility];  Service: Endoscopy;  Laterality: N/A;  . BILIARY STENT PLACEMENT N/A 08/25/2019   Procedure:  BILIARY STENT PLACEMENT;  Surgeon: Jackquline Denmark, MD;  Location: [facility]OS[facility];  Service: Endoscopy;  Laterality: N/A;  . BREAST LUMPECTOMY Left 02/06/11  . CATARACT EXTRACTION, BILATERAL  bilateral caaract removal,  . COLONOSCOPY    . CORONARY ANGIOPLASTY WITH STENT PLACEMENT     Stent 2007  . CORONARY ARTERY BYPASS GRAFT    . ENDOSCOPIC RETROGRADE CHOLANGIOPANCREATOGRAPHY (ERCP) WITH PROPOFOL N/A 01/06/2019   Procedure: ENDOSCOPIC RETROGRADE CHOLANGIOPANCREATOGRAPHY (ERCP) WITH PROPOFOL;  Surgeon: Rush Landmark Telford Nab., MD;  Location: [facility];  Service: Endoscopy;  Laterality: N/A;  [id number]  . ERCP N/A 11/28/2018   Procedure: ENDOSCOPIC RETROGRADE CHOLANGIOPANCREATOGRAPHY (ERCP);  Surgeon: Gatha Mayer, MD;  Location: [facility] ENDOSCOPY;  Service: Endoscopy;  Laterality: N/A;  . ERCP N/A 08/25/2019   Procedure: ERCP with stent  ;  Surgeon: Jackquline Denmark, MD;  Location: [facility]OS[facility];  Service: Endoscopy;  Laterality: N/A;  . ESOPHAGEAL MANOMETRY N/A 01/08/2016   Procedure: ESOPHAGEAL MANOMETRY (EM);  Surgeon: Ladene Artist, MD;  Location: [facility];  Service: Endoscopy;  Laterality: N/A;  . ESOPHAGOGASTRODUODENOSCOPY    . ESOPHAGOGASTRODUODENOSCOPY N/A 01/06/2019   Procedure: ESOPHAGOGASTRODUODENOSCOPY (EGD);  Surgeon: Rush Landmark Telford Nab., MD;  Location: [facility] ENDOSCOPY;  Service: Endoscopy;  Laterality: N/A;  . ESOPHAGOGASTRODUODENOSCOPY N/A 08/25/2019   Procedure: ESOPHAGOGASTRODUODENOSCOPY (EGD);  Surgeon: Jackquline Denmark, MD;  Location: [facility] ENDOSCOPY;  Service: Endoscopy;  Laterality: N/A;  . ESOPHAGOGASTRODUODENOSCOPY (EGD) WITH PROPOFOL N/A 12/10/2018   Procedure: ESOPHAGOGASTRODUODENOSCOPY (EGD) WITH PROPOFOL;  Surgeon: [name], MD;  Location: [facility];  Service: Endoscopy;  Laterality: N/A;  . EUS N/A 12/10/2018   Procedure: UPPER ENDOSCOPIC ULTRASOUND (EUS) RADIAL;  Surgeon: [name], MD;  Location: [facility];  Service: Endoscopy;  Laterality: N/A;  .  EUS N/A 01/06/2019   Procedure: FULL UPPER ENDOSCOPIC ULTRASOUND (EUS) RADIAL;  Surgeon: Rush Landmark Telford Nab., MD;  Location: [facility];  Service: Endoscopy;  Laterality: N/A;  . FIDUCIAL MARKER PLACEMENT  01/06/2019   Procedure: FIDUCIAL MARKER PLACEMENT;  Surgeon: Rush Landmark Telford Nab., MD;  Location: [facility];  Service: Endoscopy;;  . FINE NEEDLE ASPIRATION N/A 12/10/2018   Procedure: FINE NEEDLE ASPIRATION (FNA) LINEAR;  Surgeon: [name], MD;  Location: [facility];  Service: Endoscopy;  Laterality: N/A;  . REMOVAL OF STONES  08/25/2019   Procedure: REMOVAL OF STONES;  Surgeon: Jackquline Denmark, MD;  Location: [facility];  Service: Endoscopy;;  . [name]  01/06/2019   Procedure: [name];  Surgeon: Mansouraty, Telford Nab., MD;  Location: [facility] END[facility];  Service: Endoscopy;;  . STENT REMOVAL  01/06/2019   Procedure: STENT REMOVAL;  Surgeon: Irving Copas., MD;  Location: [facility] END[facility];  Service: Endoscopy;;    Her Family History Is Significant For: Family History  Problem Relation Age of Onset  . Stroke Mother   . Stroke Father   . Prostate cancer Father   . Breast cancer Sister   . Diabetes Sister   . Cancer Brother        gland cancer  . Heart disease Brother   . Colon cancer Neg Hx   . Stomach cancer Neg Hx   . Pancreatic cancer Neg Hx   . Kidney disease Neg Hx   . Liver disease Neg Hx     Her Social History Is Significant For: Social History   Socioeconomic History  . Marital status: Married    Spouse name: Gildardo Griffes. Lumbra  . Number of children: 2  . Years of education: 18  . Highest education level: Not on file  Occupational History  . Occupation: retired    Fish farm manager: [employer]  Tobacco Use  . Smoking status: Never Smoker  . Smokeless tobacco: Never Used  Vaping Use  . Vaping Use: Never assessed  Substance and Sexual Activity  . Alcohol use: No    Alcohol/week: 0.0 standard drinks  . Drug use: No  .  Sexual activity: Not Currently    Partners: Male    Birth control/protection: Post-menopausal  Other Topics Concern  . Not on file  Social History Narrative   Patient lives at home with her husband Marcello Moores). 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    Caffeine- sometimes- One cup of coffee.   Right handed.   Two daughters   Four granddaughters.   No EtOH, Tobacco, drugs   Social Determinants of Health   Financial Resource Strain:   . Difficulty of Paying Living Expenses:   Food Insecurity:   . Worried About Charity fundraiser in the Last Year:   . Arboriculturist in the Last Year:   Transportation Needs:   . Film/video editor (Medical):   [name] Lack of Transportation (Non-Medical):   Physical Activity:   . Days of Exercise per Week:   . Minutes of Exercise per Session:   Stress:   . Feeling of Stress :   Social Connections:   . Frequency of Communication with Friends and Family:   . Frequency of Social Gatherings with Friends and Family:   . Attends Religious Services:   . Active Member of Clubs or Organizations:   . Attends Archivist Meetings:   [name] Marital Status:     Her Allergies Are:  Allergies  Allergen Reactions  . Choline Fenofibrate Other (See Comments)     pt states INTOL to Trilipix w/ "thigh burning"  . Hctz [Hydrochlorothiazide]     Causes hyponatremia  . Simvastatin Other (See Comments)     pt states INTOL to STATINS' \\T'$ \ refuses to restart  . Levaquin [Levofloxacin In D5w]     Elevated BP  . Adhesive [Tape] Rash  . Bentyl [Dicyclomine Hcl] Other (See Comments)    "Made me feel weird and drained me"  . Ceclor [Cefaclor] Rash  . Clarithromycin Rash  . Codeine Nausea Only  . Doxycycline Rash  . Hydrocodone     Nightmare after taking cough syrup w/hydrocodone  . Lisinopril Cough    Developed ACE cough...  . Penicillins Itching, Rash and Other (See Comments)    At injection site Did it involve  swelling of the face/tongue/throat, SOB, or low BP? No Did it involve sudden or severe rash/hives, skin peeling, or any reaction on the inside of your mouth or nose? No Did you need to seek medical attention at a hospital or doctor's office? No When did it last happen?"It was a long time ago" If all above answers are "NO", may proceed with cephalosporin use.   . Tobramycin-Dexamethasone Rash  :   Her Current Medications Are:  Outpatient Encounter Medications as of 09/16/2019  Medication Sig  . acetaminophen (TYLENOL) 325 MG tablet Take 650 mg by mouth every 6 (six) hours as needed.  [name] albuterol (VENTOLIN HFA) 108 (90 Base) MCG/ACT inhaler Inhale 2 puffs into the lungs every 6 (six) hours as needed for wheezing or shortness of breath.   [name] amLODipine (NORVASC) 5 MG tablet Take 1 tablet (5 mg total) by mouth daily. (Patient taking differently: Take 5 mg by mouth every evening. )  . CALCIUM PO Take 1 tablet by mouth daily with breakfast.   . EPINEPHrine 0.3 mg/0.3 mL IJ  SOAJ injection Inject 0.3 mg into the muscle as needed (as directed- FOR A SEVERE REACTION).   [name] fexofenadine (ALLEGRA) 180 MG tablet Take 180 mg by mouth every other day.   . fluticasone (FLONASE) 50 MCG/ACT nasal spray Place 1-2 sprays into both nostrils daily as needed for allergies or rhinitis.  . Glucosamine-Chondroit-Vit C-Mn (GLUCOSAMINE CHONDR 1500 COMPLX PO) Take 1 tablet by mouth 2 (two) times daily.   . hydrALAZINE (APRESOLINE) 50 MG tablet Take 0.5 tablets (25 mg total) by mouth 3 (three) times daily. (Patient taking differently: Take 25 mg by mouth daily. )  . latanoprost (XALATAN) 0.005 % ophthalmic solution Place 1 drop into both eyes at bedtime.   [name] losartan (COZAAR) 100 MG tablet TAKE 1/2 TABLET BY MOUTH THREE TIMES A DAY (Patient taking differently: Take 50 mg by mouth daily. )  . metoprolol tartrate (LOPRESSOR) 50 MG tablet Take 1 tablet (50 mg total) by mouth 3 (three) times daily.  . nitroGLYCERIN (NITROSTAT)  0.4 MG SL tablet Place 1 tablet (0.4 mg total) under the tongue every 5 (five) minutes as needed for chest pain.  [name] ondansetron (ZOFRAN) 8 MG tablet Take 1 tablet (8 mg total) by mouth every 8 (eight) hours as needed for nausea or vomiting.  [name] oxyCODONE (OXY IR/ROXICODONE) 5 MG immediate release tablet Take 1 tablet (5 mg total) by mouth every 6 (six) hours as needed for severe pain.  . pantoprazole (PROTONIX) 40 MG tablet Take 1 tablet (40 mg total) by mouth daily.  . Psyllium (METAMUCIL FIBER PO) Take 1 Scoop by mouth daily. Mix one rounded teaspoonful into 8 oz water and drink once a day  . simethicone (MYLICON) 578 MG chewable tablet Chew 125 mg by mouth every 6 (six) hours as needed for flatulence.  . sodium chloride (OCEAN) 0.65 % SOLN nasal spray Place 1 spray into both nostrils daily as needed for congestion.   . sucralfate (CARAFATE) 1 g tablet TAKE 1 TABLET BY MOUTH BY MOUTH TWICE DAILY(ONE BEFORE DINNER) (Patient taking differently: Take 1 g by mouth 2 (two) times daily. )  . traMADol (ULTRAM) 50 MG tablet Take 1-2 tablets (50-100 mg total) by mouth every 6 (six) hours as needed for severe pain.  . vitamin B-12 (CYANOCOBALAMIN) 1000 MCG tablet Take 1,000 mcg by mouth daily.    [name] VITAMIN D PO Take 200 mcg by mouth daily with breakfast. Vitamin A  300 mcg Calcium  40 mcg   No facility-administered encounter medications on file as of 09/16/2019.  :  Review of Systems:  Out of a complete 14 point review of systems, all are reviewed and negative with the exception of these symptoms as listed below:   Review of Systems  Neurological:       Here for 1 year f/u. Reports she has been doing well on cpap.  Did want to mention she has been dx with pancreatic cancer and has received radiation.     Objective:  Neurological Exam  Physical Exam Physical Examination:   Vitals:   09/16/19 0914  BP: (!) 122/56  Pulse: 60  SpO2: 98%    General Examination: The patient is a very pleasant  84 y.o. female in no acute distress. She appears more frail, lost weight. Well groomed.   HEENT:Normocephalic, atraumatic, pupils are equal, round and reactive to light and accommodation.Corrective eye glasses in place.Extraocular tracking is good without limitation to gaze excursion or nystagmus noted. Normal smooth pursuit is noted. Hearing is grossly intact. Face  is symmetric with normal facial animation and normal facial sensation. Speech is clear with no dysarthria noted. There is no hypophonia. There is no lip, neck/head, jaw or voice tremor. Neck shows fairly good ROM. Oropharynx exam reveals:moderate mouth dryness, adequatedental hygiene and mildairway crowding. Mallampati is class II. Tongue protrudes centrally and palate elevates symmetrically.  Chest:Clear to auscultation without wheezing, rhonchi or crackles noted.  Heart:S1+S2+0, regular and normal without murmurs, rubs or gallops noted.   Abdomen:Soft, non-tender and non-distended with normal bowel sounds appreciated on auscultation.  Extremities:There isnopitting edema in the distal lower extremities bilaterally.   Skin: Warm and dry without trophic changes noted.  Musculoskeletal: exam reveals no obvious joint deformities, tenderness or joint swelling or erythema.   Neurologically:  Mental status: The patient is awake, alert and oriented in all 4 spheres.Herimmediate and remote memory, attention, language skills and fund of knowledge are appropriate. There is no evidence of aphasia, agnosia, apraxia or anomia. Speech is clear with normal prosody and enunciation. Thought process is linear. Mood is normaland affect is normal, good spirits.  Cranial nerves II - XII are as described above under HEENT exam. In addition: shoulder shrug is normal with equal shoulder height noted. Motor exam: Normal bulk, strength and tone is noted. There is no tremor.Fine motor skills and coordination: grosslyintact.  Romberg is  negative. Cerebellar testing: No dysmetria or intention tremor. No gait ataxia.  Sensory exam: intact to light touch in the upper and lower extremities.  Gait, station and balance:Shestands easily. No veering to one side is noted. No leaning to one side is noted. Posture is age-appropriate and stance is narrow based. Gait showsnormalstride length and normalpace.   Assessmentand Plan:  In summary,Graclyn M Venableis a very pleasant 18 year oldfemalewith anunderlying medical history of coronary artery disease with status post CABG, irritable bowel syndrome, reflux disease, diverticulosis, history of breast cancer, fibromyalgia, hyperlipidemia, neck pain, glaucoma, hypertension, recurrent headaches and recent diagnosis of pancreatic cancer with status post radiation therapy and recent ERCP for stent placement, who presents for follow-up consultation of her sleep apnea, on CPAP of 6 cm.  She continues to be compliant with treatment.  She does have some difficulty with tolerance of the pressure, we mutually agreed to reduce her CPAP pressure to 4 cm at this time.  She is commended for her treatment adherence.  She is in good spirits, holding up okay despite recent significant stressors in her life, such an admirable woman!  We mutually agreed to pursue a virtual visit Through MyChart, she is confident that she can do this with the help of one of her daughters her grandchildren.  Ianswered all her questions today and she was in agreement. She is encouraged to call for any interim problems with her CPAP machine or sleep apnea. I spent 25 minutes in total face-to-face time and in reviewing records during pre-charting, more than 50% of which was spent in counseling and coordination of care, reviewing test results, reviewing medications and treatment regimen and/or in discussing or reviewing the diagnosis of OSA, the prognosis and treatment options. Pertinent laboratory and imaging test results that were  available during this visit with the patient were reviewed by me and considered in my medical decision making (see chart for details).

## 2019-09-21 ENCOUNTER — Encounter: Payer: Self-pay | Admitting: *Deleted

## 2019-09-21 ENCOUNTER — Other Ambulatory Visit: Payer: Self-pay | Admitting: *Deleted

## 2019-09-21 NOTE — Patient Outreach (Signed)
[facility]) Care Management  09/21/2019  [name] Oct 24, 1935 [id number]   Subjective:  Telephone call to patient's home / mobile number, spoke with patient, and HIPAA verified.  Discussed [facility] Care Management Medicare EMMI General Discharge Red Flag Alert  follow up, patient voiced understanding, and is in agreement to follow up. States she does not remember receiving EMMI automated calls.   Patient states she is doing well, considering her cancer diagnosis, and is still able to do activities of daily living.  Patient states she is able to manage self care and has assistance as needed. Patient voices understanding of medical diagnosis and treatment plan.  States she is receiving palliative care services through [facility], services are going well, and next visit will be on 09/28/2019.  States she has had follow up visits with neurologist on 09/16/2019, with gastroenterologist on 09/07/2019, and with oncologist on 09/01/2019, all appointments went well.  States she does not have a follow up appointment scheduled with primary MD.   Discussed importance of hospital follow up with primary MD, patient voices understanding, and states she will follow up as appropriate, will call office on 09/22/2019 to schedule follow up.  Patient states she is aware of signs/ symptoms to report, how to reach provider if needed after hours, when to go to ED, and / or call 911.   Patient states she has received Covid 19 vaccine.  States she monitors and records blood pressure daily, today's was 113/58, felt a little weak with this pressure, feels better now, blood pressure was a little higher than that when she was in pain, now closer to her average, and her average is 120/64.  States she is accessing her Medicare benefits as needed via member services number on back of card. Discussed Advanced Directives, advised of [employer] document scanning  system via local providers office or hospitals, patient voices  understanding, and she will discuss accessing through patient's primary provider or palliative care if needed in the future.   States palliative care is assisting her with getting her Advanced Directives paperwork in place.   Patient states she does not have any education material, EMMI follow up, care coordination, care management, disease monitoring, transportation, community resource, or pharmacy needs at this time.  Statess she is very appreciative of the follow up, is in agreement  to receive 1 additional follow up call to assess for further CM needs, and is in agreement to receive [facility] EMMI follow up calls as needed.  States she has this RNCM's and [facility] contact information, will call if assistance needed prior to next patent outreach.       Objective: Per KPN (Knowledge Performance Now, point of care tool) and chart review, patient hospitalized 08/24/2019 - 08/28/2019 for upper GI bleed due to tumor ingrowth/ progression into the biliary stent/ complicated with cholangitis, Adenocarcinoma of the pancreas status post uncovered metal biliary stent placement for severe malignant biliary stricture, on November 8250, now complicated with acute cholangitis/sepsis (endorgan failure lactic acidosis in transit hypoxic respiratory failure).    Patient also has a history the following: hypertension, CAD status post bypass grafting,breast cancer status postlumpectomy, Asthma, OSA on CPAP, pancreatic adenocarcinoma, and Chronic anemia with iron deficiency.    Assessment: Received Medicare EMMI General Discharge Red Flag Alert follow up referral on 09/03/2019.    Red Flag Alert Trigger, Day #4, patient answered no to the following question: Scheduled follow-up?   EMMI follow up completed and will follow up  to assess further care management needs.      Plan: RNCM will call patient for telephone outreach attempt, within 30 business days, EMMI follow up, to assess for further CM needs,  and proceed with case closure, after 4th unsuccessful outreach call.         Xzavior Reinig H. Annia Friendly, BSN, [place] [facility] Telephonic CM Phone: [phone] Fax: [phone]

## 2019-09-22 ENCOUNTER — Telehealth: Payer: Self-pay | Admitting: Hematology and Oncology

## 2019-09-22 NOTE — Telephone Encounter (Signed)
Scheduled per 7/28 sch message. Unable to reach pt. Left voicemail with appt time and date.

## 2019-09-23 ENCOUNTER — Other Ambulatory Visit: Payer: Self-pay | Admitting: Hematology and Oncology

## 2019-09-23 ENCOUNTER — Other Ambulatory Visit: Payer: Self-pay | Admitting: Gastroenterology

## 2019-09-23 MED FILL — traMADol HCL 50 MG TABS: 50 | 5 days supply | Qty: 40 | Fill #0

## 2019-09-24 ENCOUNTER — Other Ambulatory Visit: Payer: Self-pay

## 2019-09-24 MED ORDER — HYDRALAZINE HCL 50 MG PO TABS: 25.0000 mg | ORAL_TABLET | Freq: Three times a day (TID) | ORAL | 1 refills | Status: AC

## 2019-09-24 NOTE — Telephone Encounter (Signed)
Last OV 05/13/19 Last fill 04/13/18  #135/1

## 2019-09-27 NOTE — Progress Notes (Signed)
COMMUNITY PALLIATIVE CARE RN NOTE  PATIENT NAME: [name] DOB: 13-Feb-1936 MRN: [id number]  PRIMARY CARE PROVIDER: Ronnald Nian, DO  RESPONSIBLE PARTY:  Acct ID - Guarantor Home Phone Work Phone Relationship Acct Type  [id number] [name]  Self P/F     [address], [place], [place] [address]   Covid-19 Pre-screening Negative  PLAN OF CARE and INTERVENTION:  1. ADVANCE CARE PLANNING/GOALS OF CARE: Goal is for patient to remain at home with her husband. She requested a DNR. Provided signed copy for her to keep in her home.  2. PATIENT/CAREGIVER EDUCATION: Symptom management (pain, nausea), safe mobility 3. DISEASE STATUS: Met with patient, her husband and two daughters in patient's home. Upon arrival, patient is sitting up in her chair. She remains alert and oriented x 3. She is very pleasant and engaging. She has been experiencing increased pain across her stomach. She is now taking Tramadol and Tylenol to control her pain. She also has Oxycodone 5 mg tablets available for more severe pain. She is also now taking Protonix and IBGard. She was recently hospitalized from 08/24/19 to 08/28/19 for an upper GI bleed that resulted from her tumor growth progression into her biliary stent. She received 2 units of PRBCs and an iron infusion. She was also placed on antibiotics due to developing a fever and hypoxemia. Her Aspirin has been discontinued. She has been experiencing some increased nausea and now has agreed to have Ondansetron available. She has not taken this medication as of yet. She had a follow up appointment this morning with her GI MD. She is sleeping well throughout the night most nights. Her biggest complaint is not having much energy. She is taking 2-3 naps per day. She remains ambulatory and independent with ADLs. Her intake remains good. She has had some recent issues with diarrhea, but Imodium does help. Reinforced the importance of good pain management to keep  pain under control. She just recently celebrated her anniversary and birthday so shared with me the cards and presents she received. Will continue to monitor.   HISTORY OF PRESENT ILLNESS:  This is a 84 yo female with a diagnosis of Pancreatic cancer. Palliative care team continues to follow patient and visits monthly and PRN.  CODE STATUS: DNR ADVANCED DIRECTIVES: N MOST FORM: no PPS: 50%   PHYSICAL EXAM:   LUNGS: clear to auscultation  CARDIAC: Cor RRR EXTREMITIES: No edema SKIN: Exposed skin is dry and intact  NEURO: Alert and oriented x 3, pleasant and engaging, generalized weakness, ambulatory    (Duration of visit and documentation 60 minutes)   Daryl Eastern, RN BSN

## 2019-09-28 ENCOUNTER — Other Ambulatory Visit: Payer: Medicare Other | Admitting: *Deleted

## 2019-09-28 ENCOUNTER — Other Ambulatory Visit: Payer: Self-pay

## 2019-09-28 DIAGNOSIS — Z515 Encounter for palliative care: Secondary | ICD-10-CM

## 2019-09-29 ENCOUNTER — Telehealth: Payer: Self-pay | Admitting: Gastroenterology

## 2019-09-29 MED ORDER — PANTOPRAZOLE SODIUM 40 MG PO TBEC: 40.0000 mg | DELAYED_RELEASE_TABLET | Freq: Every day | ORAL | 5 refills | Status: DC

## 2019-09-29 NOTE — Telephone Encounter (Signed)
Pantoprazole refill sent to patients pharmacy.

## 2019-09-29 NOTE — Telephone Encounter (Signed)
Requesting refill on Protonix

## 2019-09-29 NOTE — Progress Notes (Signed)
COMMUNITY PALLIATIVE CARE RN NOTE  PATIENT NAME: [name] DOB: 05/19/1935 MRN: [id number]  PRIMARY CARE PROVIDER: Cirigliano, Mary K, DO  RESPONSIBLE PARTY: [name] (daughter) Acct ID - Guarantor Home Phone Work Phone Relationship Acct Type  [id number] - Holzhauer,PAT* [phone]  Self P/F     [address], [place], [place] [address]   Covid-19 Pre-screening Negative  PLAN OF CARE and INTERVENTION:  1. ADVANCE CARE PLANNING/GOALS OF CARE: Goal is for patient to remain at home with her husband. She has a DNR and completed we completed a MOST form today (DNR, Comfort Measures, No IV fluids, No antibiotics, No feeding tube) 2. PATIENT/CAREGIVER EDUCATION: Symptom management 3. DISEASE STATUS: Met with patient, husband and two daughter's in their home. Patient continues to report feeling tired and fatigued with activity. She remains able to perform all ADLs independently. She is able to prepare meals at times and still does the laundry. She does take rest periods and is napping more during the day. She reports pain across her stomach and lower back. Last pm, she took Tramadol 2 tabs vs 1 at bedtime and reports that she was able to rest better. When taking only 1 tablet along with Tylenol, she would wake up during the night in pain. She is taking Tramadol during the day prn. Recommended that she routinely take Tramadol 1-2 tabs in the morning and 2 tabs at bedtime to keep pain more controlled and at other times prn to see how this does. She has Oxycodone available for more severe pain. She does have intermittent nausea, but states it is relieved by her chewing on ginger sticks. She has Zofran available if needed. Her appetite remains good. No reports of dysphagia. She now eats smaller meals several times throughout the day. Her bowel movements have been soft. No recent issues with diarrhea. No reports of bloody, black or tarry stools. She has an appointment to have her labs drawn next week. We  reviewed and completed the MOST form. Selections noted above. Will continue to monitor.   HISTORY OF PRESENT ILLNESS: This is a 84 yo female with a diagnosis of Pancreatic cancer. Palliative care team continues to follow patient and visits monthly and PRN.   CODE STATUS: DNR ADVANCED DIRECTIVES: N MOST FORM: yes PPS: 50%   PHYSICAL EXAM:   LUNGS: clear to auscultation  CARDIAC: Cor RRR}  EXTREMITIES: No edema SKIN: Exposed skin is dry and intact  NEURO: Alert and oriented x 3, pleasant mood, generalized weakness, ambulatory   (Duration of visit and documentation 75 minutes)   Monishia Howard, RN BSN 

## 2019-10-05 ENCOUNTER — Other Ambulatory Visit: Payer: Self-pay | Admitting: Hematology and Oncology

## 2019-10-05 DIAGNOSIS — D500 Iron deficiency anemia secondary to blood loss (chronic): Secondary | ICD-10-CM

## 2019-10-05 DIAGNOSIS — C259 Malignant neoplasm of pancreas, unspecified: Secondary | ICD-10-CM

## 2019-10-05 MED FILL — AMLODIPINE BESYLATE 5 MG TA: 5 | 90 days supply | Qty: 90 | Fill #3

## 2019-10-05 NOTE — Progress Notes (Signed)
[place] [phone]   [phone]  PROGRESS NOTE  Patient Care Team: Ronnald Nian, DO as PCP - General (Family Medicine) Josue Hector, MD as PCP - Cardiology (Cardiology) Neldon Mc, MD as Surgeon (General Surgery) Gery Pray, MD (Radiation Oncology) Magrinat, Virgie Dad, MD (Hematology and Oncology) Cameron Sprang, MD as Consulting Physician (Neurology) Placke, Dawn, RN (Inactive) as Registered Nurse Placke, Dawn, RN (Inactive) as Oncology Nurse Navigator Serena Colonel, RN as [place] [facility]  Hematological/Oncological History  # Pancreatic Adenocarcinoma Stage IA (T1N0M0). Locally Advanced, Unresectable 1) 11/26/2018: patient had visit with PCP for pruritis, found to have elevated LFTs 2) 11/27/2018: patient presented to the ED. Underwent CT scan which revealed 1.9 x 1.6 x 1.6 cm cm mass in the head of the pancreas with biliary duct dilation 3) 11/28/2018: ERCP performed to place stent for biliary obstruction  4) 12/10/2018: EUS w/ FNA reveals adenocarcinoma of the pancreas 5) 12/18/2018: establish care with Dr. Lorenso Courier  6) 12/23/2018: surgical visit with Dr. Barry Dienes 7) 12/30/2018: radiation oncology visit with Dr. Sondra Come. Agreeable to SBRT to pancreatic mass.  8) 01/2019: Radiation delayed due to infection with COVID-19.  9) 03/08/2019-03/18/2019: Palliative radiation therapy to pancreatic mass 10) 05/06/2019: CT scan abdomen shows modest increase in tumor size with no clear evidence of metastatic spread.  11) 08/25/2019: ERCP performed while admitted due to acute drop in Hgb and marked increase in LFTs.   #Breast Cancer T2 N0, stage IIA invasive ductal carcinoma, grade 3,  ER+/PR+/HER-2 not amplified 1) December 2012:  leftlumpectomy and sentinel lymph node biopsy  2) March 2013: completed radiation therapy, started on letrozole 3) Feb 2018: completed letrozole therapy   HISTORY OF PRESENTING  ILLNESS:  [name] 84 y.o. female with medical history significant for CAD s/p CABG in 2011, OSA on CPAP, Breast Cancer, and GERD who presents for a follow up for adenocarcinoma of the pancreas. She was last seen in our clinic on 09/01/2019. In the interim she has had no hospitalizations or ED visits.   On exam today [name] reports her energy levels have been declining since her last visit.  She reports that she does not get as much done as she used to.  She states that she did feel like cooking last night and was able to cook some okra.  Otherwise she is continuing to have some occasional spasms of pain in her abdomen which can reach 6 or 7 of the 10 in severity.  She notes that they hit fast and the ease off.  She notes that she has not required or has not used much of the oxycodone since her last visit, though was agreeable to starting them at nighttime.  Furthermore she has been sparing with her use of Tylenol and tramadol.  She has not been having any issues with nausea, vomiting, or diarrhea.  She had no additional questions or concerns today.  A full 10 point ROS is listed below.   MEDICAL HISTORY:  Past Medical History:  Diagnosis Date  . Asthma   . Atrophic vaginitis   . Breast cancer, Left 12/20/2010   NO BLOOD PRESSURE CHECKS OR STICKS IN LEFT ARM  . CAD (coronary artery disease)    a. 01/19/2010 s/p CABG x 3, lima->lad, vg->diag, vg->om1;  b. 06/2011 :Lexi MV: EF 84%, No ischemia. c. 01/06/14 s/p negative nuclear stress test with EF >70%  . Cervical arthritis   . Colonic polyp 04-27-2009  tubular adenoma  . Diverticulosis of colon (without mention of hemorrhage)   . Fibromyalgia   . Gallstones   . GERD (gastroesophageal reflux disease)   . Glaucoma   . Headache(784.0)    a. frequently assocaited with high BPs  . Hiatal hernia   . Hypercholesterolemia   . Irritable bowel syndrome   . Labile hypertension   . Lymphedema    left arm  . Pancreatic cancer ([place]) dx'd  11/2018  . Pinched vertebral nerve   . Secondary diabetes ([place]) 12/03/2018  . Sleep apnea     SURGICAL HISTORY: Past Surgical History:  Procedure Laterality Date  . [place] STUDY N/A 01/08/2016   Procedure: [place] STUDY;  Surgeon: Ladene Artist, MD;  Location: [facility] ENDOSCOPY;  Service: Endoscopy;  Laterality: N/A;  . BILIARY BRUSHING  11/28/2018   Procedure: BILIARY BRUSHING;  Surgeon: Gatha Mayer, MD;  Location: [facility] ENDOSCOPY;  Service: Endoscopy;;  . BILIARY DILATION  01/06/2019   Procedure: BILIARY DILATION;  Surgeon: Irving Copas., MD;  Location: [facility] ENDOSCOPY;  Service: Endoscopy;;  . BILIARY STENT PLACEMENT  11/28/2018   Procedure: BILIARY STENT PLACEMENT;  Surgeon: Gatha Mayer, MD;  Location: [facility] ENDOSCOPY;  Service: Endoscopy;;  . BILIARY STENT PLACEMENT N/A 01/06/2019   Procedure: BILIARY STENT PLACEMENT;  Surgeon: Irving Copas., MD;  [facility]: [facility] ENDOS[facility];  Service: Endoscopy;  Laterality: N/A;  . BILIARY STENT PLACEMENT N/A 08/25/2019   Procedure: BILIARY STENT PLACEMENT;  Surgeon: Jackquline Denmark, MD;  Location: [facility] ENDOS[facility];  Service: Endoscopy;  Laterality: N/A;  . BREAST LUMPECTOMY Left 02/06/11  . CATARACT EXTRACTION, BILATERAL     bilateral caaract removal,  . COLONOSCOPY    . CORONARY ANGIOPLASTY WITH STENT PLACEMENT     Stent 2007  . CORONARY ARTERY BYPASS GRAFT    . ENDOSCOPIC RETROGRADE CHOLANGIOPANCREATOGRAPHY (ERCP) WITH PROPOFOL N/A 01/06/2019   Procedure: ENDOSCOPIC RETROGRADE CHOLANGIOPANCREATOGRAPHY (ERCP) WITH PROPOFOL;  Surgeon: Rush Landmark Telford Nab., MD;  Location: [facility];  Service: Endoscopy;  Laterality: N/A;  [id number]  . ERCP N/A 11/28/2018   Procedure: ENDOSCOPIC RETROGRADE CHOLANGIOPANCREATOGRAPHY (ERCP);  Surgeon: Gatha Mayer, MD;  Location: [facility] ENDOSCOPY;  Service: Endoscopy;  Laterality: N/A;  . ERCP N/A 08/25/2019   Procedure: ERCP with stent  ;  Surgeon: Jackquline Denmark, MD;  [facility] ENDOS[facility];  Service:  Endoscopy;  Laterality: N/A;  . ESOPHAGEAL MANOMETRY N/A 01/08/2016   Procedure: ESOPHAGEAL MANOMETRY (EM);  Surgeon: Ladene Artist, MD;  [facility]: [facility];  Service: Endoscopy;  Laterality: N/A;  . ESOPHAGOGASTRODUODENOSCOPY    . ESOPHAGOGASTRODUODENOSCOPY N/A 01/06/2019   Procedure: ESOPHAGOGASTRODUODENOSCOPY (EGD);  Surgeon: Rush Landmark Telford Nab., MD;  Location: [facility] END[facility];  Service: Endoscopy;  Laterality: N/A;  . ESOPHAGOGASTRODUODENOSCOPY N/A 08/25/2019   Procedure: ESOPHAGOGASTRODUODENOSCOPY (EGD);  Surgeon: Jackquline Denmark, MD;  Location: [facility];  Service: Endoscopy;  Laterality: N/A;  . ESOPHAGOGASTRODUODENOSCOPY (EGD) WITH PROPOFOL N/A 12/10/2018   Procedure: ESOPHAGOGASTRODUODENOSCOPY (EGD) WITH PROPOFOL;  Surgeon: [name], MD;  [facility];  Service: Endoscopy;  Laterality: N/A;  . EUS N/A 12/10/2018   Procedure: UPPER ENDOSCOPIC ULTRASOUND (EUS) RADIAL;  Surgeon: [name], MD;  [facility];  Service: Endoscopy;  Laterality: N/A;  . EUS N/A 01/06/2019   Procedure: FULL UPPER ENDOSCOPIC ULTRASOUND (EUS) RADIAL;  Surgeon: Rush Landmark Telford Nab., MD;  Location: [facility] END[facility];  Service: Endoscopy;  Laterality: N/A;  . FIDUCIAL MARKER PLACEMENT  01/06/2019   Procedure: FIDUCIAL MARKER PLACEMENT;  Surgeon: Irving Copas.,  MD;  Location: [facility] ENDOS[facility];  Service: Endoscopy;;  . FINE NEEDLE ASPIRATION N/A 12/10/2018   Procedure: FINE NEEDLE ASPIRATION (FNA) LINEAR;  Surgeon: [name], MD;  [facility];  Service: Endoscopy;  Laterality: N/A;  . REMOVAL OF STONES  08/25/2019   Procedure: REMOVAL OF STONES;  Surgeon: Jackquline Denmark, MD;  [facility];  Service: Endoscopy;;  . [name]  01/06/2019   Procedure: [name];  Surgeon: Mansouraty, Telford Nab., MD;  [facility]: [facility];  Service: Endoscopy;;  . STENT REMOVAL  01/06/2019   Procedure: STENT REMOVAL;  Surgeon: Irving Copas.,  MD;  [facility];  Service: Endoscopy;;    ALLERGIES:  is allergic to choline fenofibrate, hctz [hydrochlorothiazide], simvastatin, levaquin [levofloxacin in d5w], adhesive [tape], bentyl [dicyclomine hcl], ceclor [cefaclor], clarithromycin, codeine, doxycycline, hydrocodone, lisinopril, penicillins, and tobramycin-dexamethasone.  MEDICATIONS:  Current Outpatient Medications  Medication Sig Dispense Refill  . oxyCODONE (OXY IR/ROXICODONE) 5 MG immediate release tablet Take 1 tablet (5 mg total) by mouth every 6 (six) hours as needed for severe pain. 28 tablet 0  . acetaminophen (TYLENOL) 325 MG tablet Take 650 mg by mouth every 6 (six) hours as needed.    [name] albuterol (VENTOLIN HFA) 108 (90 Base) MCG/ACT inhaler Inhale 2 puffs into the lungs every 6 (six) hours as needed for wheezing or shortness of breath.     [name] amLODipine (NORVASC) 5 MG tablet Take 1 tablet (5 mg total) by mouth daily. (Patient taking differently: Take 5 mg by mouth every evening. ) 90 tablet 3  . CALCIUM PO Take 1 tablet by mouth daily with breakfast.  (Patient not taking: Reported on 09/21/2019)    . EPINEPHrine 0.3 mg/0.3 mL IJ SOAJ injection Inject 0.3 mg into the muscle as needed (as directed- FOR A SEVERE REACTION).  (Patient not taking: Reported on 09/21/2019)    . ferrous sulfate 325 (65 FE) MG tablet Take 1 tablet (325 mg total) by mouth daily with breakfast. Please take with a source of Vitamin C 90 tablet 3  . fexofenadine (ALLEGRA) 180 MG tablet Take 180 mg by mouth every other day.     . fluticasone (FLONASE) 50 MCG/ACT nasal spray Place 1-2 sprays into both nostrils daily as needed for allergies or rhinitis.    . Glucosamine-Chondroit-Vit C-Mn (GLUCOSAMINE CHONDR 1500 COMPLX PO) Take 1 tablet by mouth 2 (two) times daily.     . hydrALAZINE (APRESOLINE) 50 MG tablet Take 0.5 tablets (25 mg total) by mouth 3 (three) times daily. 135 tablet 1  . latanoprost (XALATAN) 0.005 % ophthalmic solution Place 1 drop  into both eyes at bedtime.     [name] losartan (COZAAR) 100 MG tablet TAKE 1/2 TABLET BY MOUTH THREE TIMES A DAY (Patient taking differently: Take 50 mg by mouth daily. ) 90 tablet 0  . metoprolol tartrate (LOPRESSOR) 50 MG tablet Take 1 tablet (50 mg total) by mouth 3 (three) times daily. 270 tablet 3  . nitroGLYCERIN (NITROSTAT) 0.4 MG SL tablet Place 1 tablet (0.4 mg total) under the tongue every 5 (five) minutes as needed for chest pain. 25 tablet 2  . ondansetron (ZOFRAN) 8 MG tablet Take 1 tablet (8 mg total) by mouth every 8 (eight) hours as needed for nausea or vomiting. 30 tablet 1  . pantoprazole (PROTONIX) 40 MG tablet Take 1 tablet (40 mg total) by mouth daily. 30 tablet 5  . Psyllium (METAMUCIL FIBER PO) Take 1 Scoop by mouth daily. Mix one rounded teaspoonful into 8 oz  water and drink once a day    . simethicone (MYLICON) 818 MG chewable tablet Chew 125 mg by mouth every 6 (six) hours as needed for flatulence.    . sodium chloride (OCEAN) 0.65 % SOLN nasal spray Place 1 spray into both nostrils daily as needed for congestion.     . sucralfate (CARAFATE) 1 g tablet TAKE 1 TABLET(1 GRAM) BY MOUTH TWICE DAILY 180 tablet 0  . traMADol (ULTRAM) 50 MG tablet Take 1 tablet (50 mg total) by mouth every 6 (six) hours as needed. 90 tablet 0  . vitamin B-12 (CYANOCOBALAMIN) 1000 MCG tablet Take 1,000 mcg by mouth daily.      [name] VITAMIN D PO Take 200 mcg by mouth daily with breakfast. Vitamin A  300 mcg Calcium  40 mcg     No current facility-administered medications for this visit.    REVIEW OF SYSTEMS:   Constitutional: ( - ) fevers, ( - )  chills , ( - ) night sweats Eyes: ( - ) blurriness of vision, ( - ) double vision, ( - ) watery eyes Ears, nose, mouth, throat, and face: ( - ) mucositis, ( - ) sore throat Respiratory: ( - ) cough, ( - ) dyspnea, ( - ) wheezes Cardiovascular: ( - ) palpitation, ( - ) chest discomfort, ( - ) lower extremity swelling Gastrointestinal:  ( + ) nausea, ( - )  heartburn, ( - ) change in bowel habits Skin: ( - ) abnormal skin rashes Lymphatics: ( - ) new lymphadenopathy, ( - ) easy bruising Neurological: ( - ) numbness, ( - ) tingling, ( - ) new weaknesses Behavioral/Psych: ( - ) mood change, ( - ) new changes  All other systems were reviewed with the patient and are negative.  PHYSICAL EXAMINATION: ECOG PERFORMANCE STATUS: 2 - Symptomatic, <50% confined to bed  Vitals:   10/06/19 1342  BP: (!) 131/54  Pulse: (!) 59  Resp: 18  Temp: (!) 96.2 F (35.7 C)  SpO2: 97%   Filed Weights   10/06/19 1342  Weight: 120 lb 11.2 oz (54.7 kg)    GENERAL: pleasant, well appearing elderly Caucasian female in NAD  SKIN: skin color, texture, turgor are normal, no rashes or significant lesions EYES: conjunctiva are pink and non-injected. No jaundice evident in the sclera.  LUNGS: clear to auscultation and percussion with normal breathing effort HEART: regular rate & rhythm and no murmurs and no lower extremity edema ABDOMEN: soft and non-distended.  Musculoskeletal: no cyanosis of digits and no clubbing  PSYCH: alert & oriented x 3, fluent speech NEURO: no focal motor/sensory deficits  LABORATORY DATA:  I have reviewed the data as listed CMP Latest Ref Rng & Units 10/06/2019 09/01/2019 08/28/2019  Glucose 70 - 99 mg/dL 155(H) 183(H) 99  BUN 8 - 23 mg/dL '9 9 12  '$ Creatinine 0.44 - 1.00 mg/dL 0.74 0.76 0.53  Sodium 135 - 145 mmol/L 133(L) 135 135  Potassium 3.5 - 5.1 mmol/L 4.2 3.9 3.5  Chloride 98 - 111 mmol/L 97(L) 98 98  CO2 22 - 32 mmol/L '27 28 29  '$ Calcium 8.9 - 10.3 mg/dL 9.6 9.3 8.6(L)  Total Protein 6.5 - 8.1 g/dL 7.0 7.2 6.2(L)  Total Bilirubin 0.3 - 1.2 mg/dL 0.8 0.6 1.5(H)  Alkaline Phos 38 - 126 U/L 124 136(H) 141(H)  AST 15 - 41 U/L '22 21 24  '$ ALT 0 - 44 U/L 21 37 85(H)   CBC Latest Ref Rng & Units 10/06/2019 09/01/2019  08/28/2019  WBC 4.0 - 10.5 K/uL 4.8 5.2 6.0  Hemoglobin 12.0 - 15.0 g/dL 10.5(L) 11.3(L) 11.3(L)  Hematocrit 36 - 46 %  31.3(L) 34.2(L) 33.9(L)  Platelets 150 - 400 K/uL 258 294 202    RADIOGRAPHIC STUDIES:  CLINICAL DATA:  Epigastric abdominal pain. Blood in stool. Pancreatic adenocarcinoma. Remote history of breast cancer.  EXAM: CT ABDOMEN AND PELVIS WITH CONTRAST  TECHNIQUE: Multidetector CT imaging of the abdomen and pelvis was performed using the standard protocol following bolus administration of intravenous contrast.  CONTRAST:  121m OMNIPAQUE IOHEXOL 300 MG/ML  SOLN  COMPARISON:  08/03/2019  FINDINGS: Lower chest: Stable scarring or subsegmental atelectasis in the lung bases. Centrilobular emphysema. Old granulomatous disease. Descending thoracic aortic atherosclerotic calcification.  Hepatobiliary: Pneumobilia. Biliary stent remains in place. No compelling findings of metastatic disease to the liver. No overt gallbladder wall thickening.  Pancreas: Prominently dilated dorsal pancreatic duct similar to prior, extending to an infiltrative mass in the head of the pancreas which surrounds the stent. Several fiducials remain in place. Overall the pancreatic mass does not appear significantly changed, measuring roughly 3.1 by 2.8 cm on image 26/2. Mild surrounding inflammatory stranding along the pancreatic head.  Spleen: Unchanged with 3 small hypodense lesions which are probably benign but technically nonspecific.  Adrenals/Urinary Tract: Unremarkable  Stomach/Bowel: Indistinctness of tissue planes around the pancreatic head and adjacent portions of the duodenum, pancreatitis and duodenitis not exclude and some of this may be related to prior radiation therapy. Overall the degree of stranding around the descending and transverse duodenum is similar to 08/03/2019 and no dilation of the stomach or bowel is observed.  Sigmoid colon diverticulosis is present. No active diverticulitis identified.  Vascular/Lymphatic: The pancreatic head mass narrows the confluence of  the splenic vein and the SMV into the portal vein. Aortoiliac atherosclerotic vascular disease. Stable prominence of parametrial venous structures, left greater than right, pelvic congestion syndrome not excluded.  Reproductive: Unremarkable  Other: Low-grade stranding in the central mesentery, minimally increased from prior, nonspecific.  Musculoskeletal: Degenerative arthropathy of both hips. Grade 1 degenerative anterolisthesis at L4-5. Multilevel lumbar spondylosis and degenerative disc disease with left greater than right foraminal impingement at L5-S1. central narrowing of the thecal sac at the L4-5 level.  IMPRESSION: 1. Similar appearance of the infiltrative mass in the head of the pancreas with surrounding fiducials. The mass narrows the confluence of the splenic vein and the SMV into the portal vein. 2. Indistinctness of tissue planes around the pancreatic head and adjacent portions of the duodenum, pancreatitis and duodenitis could cause this appearance, and radiation therapy in the past may be predisposing. 3. Other imaging findings of potential clinical significance: Sigmoid colon diverticulosis. Stable prominence of parametrial venous structures, left greater than right, pelvic congestion syndrome not excluded. Multilevel lumbar spondylosis and degenerative disc disease causing left greater than right foraminal impingement at L5-S1. 4. Emphysema and aortic atherosclerosis.  Aortic Atherosclerosis (ICD10-I70.0) and Emphysema (ICD10-J43.9).   Electronically Signed   By: WVan ClinesM.D.   On: 08/24/2019 14:58  ASSESSMENT & PLAN PNITASHA [id number].o. female with medical history significant for CAD s/p CABG in 2011, OSA on CPAP, Breast Cancer, and GERD who presents for a follow up for localized adenocarcinoma of the pancreas.  On exam today Ms. VDockeryis having a decline in her energy levels and some increase in her abdominal pain, but is  otherwise quite stable.  We will continue with symptom management and close monitoring of her labs  today give her a better idea of life expectancy/progression of disease. We will see her back in 6 weeks time for continued monitoring.  #Localized Adenocarcinoma of the Pancreas, Stage IA (T1N0M0) --Completed palliatve radiation therapy on 03/18/2019  --after reviewing patients functional status and ECOG 2 I think the patient is possible a candidate for chemotherapy (likely only monotherapy gemcitabine (J Clin Oncol. 1997 Jun; 15(6):2403-13.)). This could be considered, though I do not favor that approach. The patient and her daughter are OK with continued comfort based care. --last CT scan on 08/24/2019 showed increase in size of tumor (3.1 by 2.8 cm).  --for now continued supportive care and symptom management.  --RTC in 6 weeks to assess symptoms.   #Breast Cancer T2 N0, stage IIA invasive ductal carcinoma, grade 3,  ER+/PR+/HER-2 not amplified --completed 84 year old letrozole therapy in Feb 2018 --previously followed by Dr. Gunnar Bulla Magrinat at [facility] --in remission   #Cancer Related Pain, worsening --worsening pain today, still across the top of the abdomen. --now taking [id number] mg tylenol PRN and tramadol '50mg'$  PO q8H PRN.  --patient can increase tramadol to '100mg'$  PO at night, and we will prescribe oxycodone '5mg'$  PO q6H PRN if that is ineffective --LFTs have normalized, OK to take tylenol at above dosage --will continue to monitor abdominal pain  #Symptom Management --notes light colored stools that float, but no diarrhea. Possible initial signs of pancreatic insufficiency. Continue to monitor and consider pancreatic enzymes vs low fat diet.  --no itching or jaundice today. Continue to monitor. If worsening, could be sign of stent obstruction.  --mild nausea, at patient's baseline. Treated with ginger. No diarrhea. Will provide zofran '8mg'$  PO PRN for nausea.   #Goals Of Care --patient notes  she wants to maintain a high quality of life and does not think that surgery/chemotherapy would allow that. I agree with her assessment --referred to [place] [facility] for introductions, assistance with symptoms management, and preliminary hospice discussions.   All questions were answered. The patient knows to call the clinic with any problems, questions or concerns.  A total of more than 30 minutes were spent face-to-face with the patient during this encounter and over half of that time was spent on counseling and coordination of care as outlined above.   Ledell Peoples, MD Department of Hematology/Oncology [place] at [facility] Phone: [phone] Pager: [phone] Email: [email or web address]   10/06/2019 5:45 PM   Literature Support:  Nyra Capes, Switchenko Hewitt Shorts RJ, [place], Lillian T, et al. Outcomes for patients with locally advanced pancreatic adenocarcinoma treated with stereotactic body radiation therapy versus conventionally fractionated radiation. Cancer. 2017;123(18):3486-93.  --Among 8450 patients, 7819 (92.5%) were treated with CFRT, and 631 (7.5%) underwent SBRT. Receipt of SBRT was associated with superior OS in the multivariate analysis (hazard ratio, 0.84; 95% confidence interval, 0.75?0.93; P?<?.001). With propensity score matching, 988 patients in all were matched, with 494 patients in each cohort. Within the propensity?matched cohorts, the median OS (13.9 vs 11.6 months) and the 2?year OS rate (21.7% vs 16.5%) were significantly higher with SBRT versus CFRT (P?=?.0014).

## 2019-10-06 ENCOUNTER — Inpatient Hospital Stay: Payer: Medicare Other | Attending: Hematology and Oncology | Admitting: Hematology and Oncology

## 2019-10-06 ENCOUNTER — Encounter: Payer: Self-pay | Admitting: Hematology and Oncology

## 2019-10-06 ENCOUNTER — Inpatient Hospital Stay: Payer: Medicare Other

## 2019-10-06 ENCOUNTER — Other Ambulatory Visit: Payer: Self-pay

## 2019-10-06 VITALS — BP 131/54 | HR 59 | Temp 96.2°F | Resp 18 | Ht <= 58 in | Wt 120.7 lb

## 2019-10-06 DIAGNOSIS — K219 Gastro-esophageal reflux disease without esophagitis: Secondary | ICD-10-CM | POA: Diagnosis not present

## 2019-10-06 DIAGNOSIS — I700 Atherosclerosis of aorta: Secondary | ICD-10-CM | POA: Insufficient documentation

## 2019-10-06 DIAGNOSIS — K831 Obstruction of bile duct: Secondary | ICD-10-CM | POA: Diagnosis not present

## 2019-10-06 DIAGNOSIS — Z885 Allergy status to narcotic agent status: Secondary | ICD-10-CM | POA: Diagnosis not present

## 2019-10-06 DIAGNOSIS — G893 Neoplasm related pain (acute) (chronic): Secondary | ICD-10-CM | POA: Diagnosis not present

## 2019-10-06 DIAGNOSIS — M5137 Other intervertebral disc degeneration, lumbosacral region: Secondary | ICD-10-CM | POA: Diagnosis not present

## 2019-10-06 DIAGNOSIS — G4733 Obstructive sleep apnea (adult) (pediatric): Secondary | ICD-10-CM | POA: Insufficient documentation

## 2019-10-06 DIAGNOSIS — I2510 Atherosclerotic heart disease of native coronary artery without angina pectoris: Secondary | ICD-10-CM | POA: Diagnosis not present

## 2019-10-06 DIAGNOSIS — D500 Iron deficiency anemia secondary to blood loss (chronic): Secondary | ICD-10-CM | POA: Diagnosis not present

## 2019-10-06 DIAGNOSIS — Z79899 Other long term (current) drug therapy: Secondary | ICD-10-CM | POA: Insufficient documentation

## 2019-10-06 DIAGNOSIS — C259 Malignant neoplasm of pancreas, unspecified: Secondary | ICD-10-CM | POA: Diagnosis not present

## 2019-10-06 DIAGNOSIS — J439 Emphysema, unspecified: Secondary | ICD-10-CM | POA: Diagnosis not present

## 2019-10-06 DIAGNOSIS — Z923 Personal history of irradiation: Secondary | ICD-10-CM | POA: Insufficient documentation

## 2019-10-06 DIAGNOSIS — Z881 Allergy status to other antibiotic agents status: Secondary | ICD-10-CM | POA: Insufficient documentation

## 2019-10-06 DIAGNOSIS — K921 Melena: Secondary | ICD-10-CM | POA: Insufficient documentation

## 2019-10-06 DIAGNOSIS — C801 Malignant (primary) neoplasm, unspecified: Secondary | ICD-10-CM | POA: Diagnosis not present

## 2019-10-06 DIAGNOSIS — C250 Malignant neoplasm of head of pancreas: Secondary | ICD-10-CM | POA: Insufficient documentation

## 2019-10-06 DIAGNOSIS — Z888 Allergy status to other drugs, medicaments and biological substances status: Secondary | ICD-10-CM | POA: Insufficient documentation

## 2019-10-06 DIAGNOSIS — K2980 Duodenitis without bleeding: Secondary | ICD-10-CM | POA: Insufficient documentation

## 2019-10-06 DIAGNOSIS — Z880 Allergy status to penicillin: Secondary | ICD-10-CM | POA: Diagnosis not present

## 2019-10-06 DIAGNOSIS — E119 Type 2 diabetes mellitus without complications: Secondary | ICD-10-CM | POA: Diagnosis not present

## 2019-10-06 DIAGNOSIS — Z853 Personal history of malignant neoplasm of breast: Secondary | ICD-10-CM | POA: Insufficient documentation

## 2019-10-06 DIAGNOSIS — M47816 Spondylosis without myelopathy or radiculopathy, lumbar region: Secondary | ICD-10-CM | POA: Diagnosis not present

## 2019-10-06 DIAGNOSIS — Z8719 Personal history of other diseases of the digestive system: Secondary | ICD-10-CM | POA: Diagnosis not present

## 2019-10-06 DIAGNOSIS — K859 Acute pancreatitis without necrosis or infection, unspecified: Secondary | ICD-10-CM | POA: Diagnosis not present

## 2019-10-06 LAB — CBC WITH DIFFERENTIAL (CANCER CENTER ONLY)
Abs Immature Granulocytes: 0.03 10*3/uL (ref 0.00–0.07)
Basophils Absolute: 0 10*3/uL (ref 0.0–0.1)
Basophils Relative: 1 %
Eosinophils Absolute: 0.3 10*3/uL (ref 0.0–0.5)
Eosinophils Relative: 6 %
HCT: 31.3 % — ABNORMAL LOW (ref 36.0–46.0)
Hemoglobin: 10.5 g/dL — ABNORMAL LOW (ref 12.0–15.0)
Immature Granulocytes: 1 %
Lymphocytes Relative: 30 %
Lymphs Abs: 1.4 10*3/uL (ref 0.7–4.0)
MCH: 30 pg (ref 26.0–34.0)
MCHC: 33.5 g/dL (ref 30.0–36.0)
MCV: 89.4 fL (ref 80.0–100.0)
Monocytes Absolute: 0.5 10*3/uL (ref 0.1–1.0)
Monocytes Relative: 11 %
Neutro Abs: 2.5 10*3/uL (ref 1.7–7.7)
Neutrophils Relative %: 51 %
Platelet Count: 258 10*3/uL (ref 150–400)
RBC: 3.5 MIL/uL — ABNORMAL LOW (ref 3.87–5.11)
RDW: 14.2 % (ref 11.5–15.5)
WBC Count: 4.8 10*3/uL (ref 4.0–10.5)
nRBC: 0 % (ref 0.0–0.2)

## 2019-10-06 LAB — CMP (CANCER CENTER ONLY)
ALT: 21 U/L (ref 0–44)
AST: 22 U/L (ref 15–41)
Albumin: 3.6 g/dL (ref 3.5–5.0)
Alkaline Phosphatase: 124 U/L (ref 38–126)
Anion gap: 9 (ref 5–15)
BUN: 9 mg/dL (ref 8–23)
CO2: 27 mmol/L (ref 22–32)
Calcium: 9.6 mg/dL (ref 8.9–10.3)
Chloride: 97 mmol/L — ABNORMAL LOW (ref 98–111)
Creatinine: 0.74 mg/dL (ref 0.44–1.00)
GFR, Est AFR Am: >60 mL/min (ref >60)
GFR, Estimated: >60 mL/min (ref >60)
Glucose, Bld: 155 mg/dL — ABNORMAL HIGH (ref 70–99)
Potassium: 4.2 mmol/L (ref 3.5–5.1)
Sodium: 133 mmol/L — ABNORMAL LOW (ref 135–145)
Total Bilirubin: 0.8 mg/dL (ref 0.3–1.2)
Total Protein: 7 g/dL (ref 6.5–8.1)

## 2019-10-06 LAB — RETIC PANEL
Immature Retic Fract: 14.9 % (ref 2.3–15.9)
RBC.: 3.49 MIL/uL — ABNORMAL LOW (ref 3.87–5.11)
Retic Count, Absolute: 61.8 10*3/uL (ref 19.0–186.0)
Retic Ct Pct: 1.8 % (ref 0.4–3.1)
Reticulocyte Hemoglobin: 35.1 pg (ref >27.9)

## 2019-10-06 LAB — IRON AND TIBC
Iron: 48 ug/dL (ref 41–142)
Saturation Ratios: 15 % — ABNORMAL LOW (ref 21–57)
TIBC: 309 ug/dL (ref 236–444)
UIBC: 261 ug/dL (ref 120–384)

## 2019-10-06 LAB — FERRITIN: Ferritin: 218 ng/mL (ref 11–307)

## 2019-10-06 MED ORDER — PANTOPRAZOLE SODIUM 40 MG PO TBEC: 40.0000 mg | DELAYED_RELEASE_TABLET | Freq: Every day | ORAL | 5 refills | Status: AC

## 2019-10-06 MED ORDER — TRAMADOL HCL 50 MG PO TABS: 50.0000 mg | ORAL_TABLET | Freq: Four times a day (QID) | ORAL | 0 refills | Status: DC | PRN

## 2019-10-06 MED ORDER — FERROUS SULFATE 325 (65 FE) MG PO TABS
325.0000 mg | ORAL_TABLET | Freq: Every day | ORAL | 3 refills | Status: AC
Start: 2019-10-06 — End: ?

## 2019-10-06 MED FILL — FERROUS SULFATE 325 MG TAB: 325 (65 FE) | 90 days supply | Qty: 90 | Fill #0

## 2019-10-06 MED FILL — traMADol HCL 50 MG TABS: 50 | 30 days supply | Qty: 30 | Fill #0

## 2019-10-07 ENCOUNTER — Telehealth: Payer: Self-pay | Admitting: Hematology and Oncology

## 2019-10-07 NOTE — Telephone Encounter (Signed)
Scheduled per los. Called and spoke with patient. Confirmed appt 

## 2019-10-12 ENCOUNTER — Ambulatory Visit: Payer: Self-pay | Admitting: *Deleted

## 2019-10-13 ENCOUNTER — Telehealth: Payer: Self-pay | Admitting: Cardiovascular Disease

## 2019-10-13 NOTE — Telephone Encounter (Signed)
Call sent straight to nurse. Patient's daughter [name] is concerned about her mom's BP being low. Patient's lowest number was BP 109/59 HR 59. Patient did not take her morning dose of Metoprolol, due to not feeling so well. Patient's daughter stated that patient rarely has these episodes and when she does she lays down and feels better. Encouraged patient's daughter to give patient a small salty snack and some fluids if she has these episodes again and see if that helps. Patient denies any chest pain or SOB and feels fine at this time. Encouraged patient's daughter to consult PCP about BP issues if they continue. Will forward to Dr. Johnsie Cancel for further advisement.

## 2019-10-13 NOTE — Telephone Encounter (Signed)
Pt c/o BP issue: STAT if pt c/o blurred vision, one-sided weakness or slurred speech  1. What are your last 5 BP readings?  10/13/19  7:15 am: 115/59  9:00 am: 109/59  11:00 am 133/59   2. Are you having any other symptoms (ex. Dizziness, headache, blurred vision, passed out)? no  3. What is your BP issue? The daughter of the patient states when the patient's BP goes low like this the patient just needs to lay down. The Daughter is concerned and just wanted to run this past Dr. Johnsie Cancel for advice

## 2019-10-13 NOTE — Telephone Encounter (Signed)
Should f/u with PCP

## 2019-10-14 MED FILL — PANTOPRAZOLE SOD DR 40 MG T: 40 | 30 days supply | Qty: 30 | Fill #0

## 2019-10-14 NOTE — Telephone Encounter (Signed)
Patient's daughter aware of Dr. Kyla Balzarine recommendations.

## 2019-10-19 ENCOUNTER — Other Ambulatory Visit: Payer: Self-pay | Admitting: *Deleted

## 2019-10-19 NOTE — Patient Outreach (Signed)
[facility]) Care Management  10/19/2019  [name] 07/10/1935 [id number]    Subjective:  Telephone call to patient's home number, spoke with patient, verified name, states she is currently eating lunch, and requested call back at a later time.   Objective: Per KPN (Knowledge Performance Now, point of care tool) and chart review, patient hospitalized 08/24/2019 - 08/28/2019 for upper GI bleed due to tumor ingrowth/ progression into the biliary stent/ complicated with cholangitis, Adenocarcinoma of the pancreas status post uncovered metal biliary stent placement for severe malignant biliary stricture,onNovember 5379, now complicated with acute cholangitis/sepsis (endorgan failure lactic acidosis in transit hypoxic respiratory failure).    Patient also has a history the following: hypertension, CAD status post bypass grafting,breast cancer status postlumpectomy, Asthma, OSA on CPAP, pancreatic adenocarcinoma, and Chronic anemia with iron deficiency.    Assessment: Received Medicare EMMI General Discharge Red Flag Alert follow up referral on 09/03/2019. Red Flag Alert Trigger, Day #4, patient answered no to the following question: Scheduled follow-up?   EMMI follow up completed and will follow up to assess further care management needs.      Plan: RNCM will call patient for 2nd telephone outreach attempt, within 7 business days, EMMI follow up, to assess for further CM needs, and proceed with case closure, after 4th unsuccessful outreach call.        Aundraya Dripps H. Annia Friendly, BSN, [place] Telephonic CM Phone: [phone] Fax: [phone]

## 2019-10-20 ENCOUNTER — Encounter: Payer: Self-pay | Admitting: *Deleted

## 2019-10-20 ENCOUNTER — Other Ambulatory Visit: Payer: Self-pay | Admitting: *Deleted

## 2019-10-20 NOTE — Patient Outreach (Signed)
Referral from Sonda Rumble, RN to [place] for Medication assistance.

## 2019-10-20 NOTE — Patient Outreach (Addendum)
[facility]) Care Management  [place]  10/20/2019   [name] 20-Apr-1935 [id number]  Subjective: Received voicemail message from [place], states patient's daughter / designated party release [name]) called and is requesting call back.    Telephone call from patient's daughter [name]) home / mobile number  spoke with daughter, she stated patient's name, date of birth, and address.   Discussed [facility] Care Management Medicare EMMI General Discharge Red Flag Alert follow up, daughter voiced understanding, and is in agreement to follow up on patient's behalf.   Daughter states patient is doing well and is calling on patient's behalf.   Daughter requested to be contact for patient going forward, states patient does not always realize the severity of her illness, and causes patient concern to receive several phones regarding her healthcare.    Daughter states patient may need assistance with affording medications due to the number of medications she is currently taking, does not have a medication specific need, and would welcome any medication assistance.   Daughter in agreement to referral to [place] team for medication review and patient assistance program identification.    Daughter states her father [name], date of birth 01/06/1934) may be interested in the [facility] [facility] services, does not have father's insurance information, and will give information to this RNCM at a later time.  Daughter states patient has the following schedule appointments per appointment desk: with primary MD on 10/21/2019, with gastroenterologist on 11/08/2019,  with oncologist on 11/17/2019, and with cardiologist on 12/14/2019.    States she will be meeting with primary MD to discuss consolidating hypertension medication management.   States patient does have increased blood sugar at times due to pancreatic cancer and does not wish to be on medication at this time,  continues to monitor, as needed.  States patient's blood pressure is being monitored daily and within target range.  Daughter states no education needed regarding hypertension or blood sugar management.   Patient will continue to receive services through Palliative Care and [facility] will provide pancreatic cancer care coordination, other disease education/ monitoring as needed, and patient's daughter in agreement.   Daughter states patient does not have any education material, EMMI follow up, transportation, or community resource needs at this time.  Daughter is aware 30 day EMMI automated call follow up is completed.  States she is very appreciative of the follow up and is in agreement to continue to receive [facility] [facility] information / services on patient's behalf.  Telephone to patient's daughter's [name]) home / mobile number per daughter's request, left HIPAA compliant voicemail message, and requested call back.    Objective: Per KPN (Knowledge Performance Now, point of care tool) and chart review,patient hospitalized 08/24/2019 - 08/28/2019 forupper GI bleed due to tumor ingrowth/ progression into the biliary stent/ complicated with cholangitis,Adenocarcinoma of the pancreas status post uncovered metal biliary stent placement for severe malignant biliary stricture,onNovember 7829, now complicated with acute cholangitis/sepsis (endorgan failure lactic acidosis in transit hypoxic respiratory failure). Patient also has a history the following: hypertension, CAD status post bypass grafting,breast cancer status postlumpectomy, Asthma, OSA on CPAP,pancreatic adenocarcinoma, andChronic anemia with iron deficiency.    Encounter Medications:  Outpatient Encounter Medications as of 10/20/2019  Medication Sig Note  . acetaminophen (TYLENOL) 325 MG tablet Take 650 mg by mouth every 6 (six) hours as needed.   [name] albuterol (VENTOLIN HFA) 108 (90 Base) MCG/ACT inhaler Inhale 2 puffs  into the lungs every 6 (six) hours as needed for wheezing or shortness of breath.    [name] amLODipine (NORVASC) 5 MG tablet Take 1 tablet (5 mg total) by mouth daily. (Patient taking differently: Take 5 mg by mouth every evening. )   . CALCIUM PO Take 1 tablet by mouth daily with breakfast.  09/21/2019: States currently out and planning to buy more in the near future.   . fexofenadine (ALLEGRA) 180 MG tablet Take 180 mg by mouth every other day.    . fluticasone (FLONASE) 50 MCG/ACT nasal spray Place 1-2 sprays into both nostrils daily as needed for allergies or rhinitis.   . hydrALAZINE (APRESOLINE) 50 MG tablet Take 0.5 tablets (25 mg total) by mouth 3 (three) times daily. 10/06/2019: 1/2 tablet at night only  . latanoprost (XALATAN) 0.005 % ophthalmic solution Place 1 drop into both eyes at bedtime.    [name] losartan (COZAAR) 100 MG tablet TAKE 1/2 TABLET BY MOUTH THREE TIMES A DAY (Patient taking differently: Take 50 mg by mouth daily. )   . metoprolol tartrate (LOPRESSOR) 50 MG tablet Take 1 tablet (50 mg total) by mouth 3 (three) times daily.   . ondansetron (ZOFRAN) 8 MG tablet Take 1 tablet (8 mg total) by mouth every 8 (eight) hours as needed for nausea or vomiting.   [name] oxyCODONE (OXY IR/ROXICODONE) 5 MG immediate release tablet Take 1 tablet (5 mg total) by mouth every 6 (six) hours as needed for severe pain.   [name] Psyllium (METAMUCIL FIBER PO) Take 1 Scoop by mouth daily. Mix one rounded teaspoonful into 8 oz water and drink once a day   . simethicone (MYLICON) 209 MG chewable tablet Chew 125 mg by mouth every 6 (six) hours as needed for flatulence.   . sodium chloride (OCEAN) 0.65 % SOLN nasal spray Place 1 spray into both nostrils daily as needed for congestion.    . sucralfate (CARAFATE) 1 g tablet TAKE 1 TABLET(1 GRAM) BY MOUTH TWICE DAILY   . traMADol (ULTRAM) 50 MG tablet Take 1 tablet (50 mg total) by mouth every 6 (six) hours as needed.   [name] EPINEPHrine 0.3 mg/0.3 mL IJ SOAJ injection Inject  0.3 mg into the muscle as needed (as directed- FOR A SEVERE REACTION).  (Patient not taking: Reported on 09/21/2019) 10/20/2019: Daughter states has not needed.   . ferrous sulfate 325 (65 FE) MG tablet Take 1 tablet (325 mg total) by mouth daily with breakfast. Please take with a source of Vitamin C 10/20/2019: Daughter states not sure, will verify.   . Glucosamine-Chondroit-Vit C-Mn (GLUCOSAMINE CHONDR 1500 COMPLX PO) Take 1 tablet by mouth 2 (two) times daily.  10/20/2019: Daughter states not sure will check on.   . nitroGLYCERIN (NITROSTAT) 0.4 MG SL tablet Place 1 tablet (0.4 mg total) under the tongue every 5 (five) minutes as needed for chest pain. (Patient not taking: Reported on 10/20/2019) 10/20/2019: Daughter states has not needed.   . pantoprazole (PROTONIX) 40 MG tablet Take 1 tablet (40 mg total) by mouth daily. (Patient not taking: Reported on 10/20/2019) 10/20/2019: Daughter states not sure, will verify.   . vitamin B-12 (CYANOCOBALAMIN) 1000 MCG tablet Take 1,000 mcg by mouth daily.   10/20/2019: Daughter not sure will follow up.   [name] VITAMIN D PO Take 200 mcg by mouth daily with breakfast. Vitamin A  300 mcg Calcium  40 mcg 10/20/2019: Daughter not sure will follow up.    No facility-administered encounter medications on file as of  10/20/2019.    Functional Status:  In your present state of health, do you have any difficulty performing the following activities: 08/24/2019 08/24/2019  Hearing? - N  Vision? - N  Difficulty concentrating or making decisions? - N  Walking or climbing stairs? - N  Dressing or bathing? - N  Doing errands, shopping? N -  Some recent data might be hidden    Fall/Depression Screening: Fall Risk  10/20/2019 09/21/2019 09/21/2018  Falls in the past year? 0 0 0  Number falls in past yr: 0 0 0  Injury with Fall? 0 0 0  Risk for fall due to : No Fall Risks Other (Comment) -  Risk for fall due to: Comment - Recent hospitalization -  Follow up Falls evaluation  completed;Education provided Falls evaluation completed;Education provided;Falls prevention discussed Falls evaluation completed   PHQ 2/9 Scores 09/21/2019 05/13/2019 04/13/2018 12/20/2015 08/11/2013 07/03/2012  PHQ - 2 Score 0 0 0 0 0 1    Assessment: Received Medicare EMMI General Discharge Red Flag Alert follow up referral on 09/03/2019. Red Flag Alert Trigger, Day #4, patient answered no to the following question: Scheduled follow-up?EMMI follow up completed and will continue to follow up for pancreatic cancer care coordination, and other disease education/ monitoring as needed   Goals    .  HDL > 40    .  LDL CALC < 70    .  Patient's daughter stated goal is to maintain patient's quality of life. (pt-stated)      CARE PLAN ENTRY (see longitudinal plan of care for additional care plan information)  Current Barriers:  [name] Knowledge Deficits related to pancreatic cancer.  Nurse Case Manager Clinical Goal(s):  [name] Over the next 60 days, patient will not experience hospital admission. Hospital Admissions in last 6 months = 2 . Over the next 30 days, patient will attend all scheduled medical appointments: primary MD, oncologist, and gastroenterologist.  Interventions:  . Inter-disciplinary care team collaboration (see longitudinal plan of care) . Reviewed medications with patient and discussed with patient's daughter. . Advised patient, providing education and rationale, to monitor blood pressure daily and record, calling primary MD for findings outside established parameters.  . Pharmacy referral for medication review and patient assistance program identification.  Patient Self Care Activities:  . Attends all scheduled provider appointments . Performs ADL's independently . Per patient's daughter, patient may not be aware of severity of illness at all times.   Initial goal documentation        Plan: RNCM will send patient welcome outreach letter, welcome packet, consent, [facility] pamphlet,  and magnet. RNCM will send primary MD barrier/ involvement letter and route assessment.  RNCM will call patient's daughter / designated party release [name]) for telephone outreach attempt, within 30 business days, other diagnosis care coordination follow up. and proceed with case closure, after 4th unsuccessful outreach call.      Celica Kotowski H. Annia Friendly, BSN, [place] [facility] Telephonic CM Phone: [phone] Fax: [phone]

## 2019-10-21 ENCOUNTER — Telehealth (INDEPENDENT_AMBULATORY_CARE_PROVIDER_SITE_OTHER): Payer: Medicare Other | Admitting: Family Medicine

## 2019-10-21 ENCOUNTER — Encounter: Payer: Self-pay | Admitting: Family Medicine

## 2019-10-21 VITALS — BP 118/68 | HR 60 | Ht <= 58 in | Wt 120.0 lb

## 2019-10-21 DIAGNOSIS — I10 Essential (primary) hypertension: Secondary | ICD-10-CM | POA: Diagnosis not present

## 2019-10-21 MED ORDER — METOPROLOL TARTRATE 50 MG PO TABS: 50.0000 mg | ORAL_TABLET | Freq: Three times a day (TID) | ORAL | 3 refills | Status: AC

## 2019-10-21 MED ORDER — METOPROLOL TARTRATE 50 MG PO TABS: 50.0000 mg | ORAL_TABLET | Freq: Three times a day (TID) | ORAL | 3 refills | Status: DC

## 2019-10-21 MED ORDER — LOSARTAN POTASSIUM 50 MG PO TABS: 50.0000 mg | ORAL_TABLET | Freq: Every day | ORAL | 3 refills | Status: AC

## 2019-10-21 MED FILL — METOPROLOL TARTRATE 50 MG T: 50 | 90 days supply | Qty: 270 | Fill #0

## 2019-10-21 MED FILL — LOSARTAN POTASSIUM 50 MG TA: 50 | 90 days supply | Qty: 90 | Fill #0

## 2019-10-21 NOTE — Progress Notes (Signed)
Virtual Visit via Video Note  I connected with [name] on 10/21/19 at 11:30 AM EDT by a video enabled telemedicine application and verified that I am speaking with the correct person using two identifiers. Location patient: home Location provider: work  Persons participating in the virtual visit: patient, provider  I discussed the limitations of evaluation and management by telemedicine and the availability of in person appointments. The patient expressed understanding and agreed to proceed.  Chief Complaint  Patient presents with  . Follow-up    Bp med refills and discussion//pt states top number has been running low and bottom is ok     HPI: [name] is a 84 y.o. female with a h/o HTN who states her SBP readings have been running low. She is on cozaar 50mg  daily, hydralazine 25mg  TID, norvasc 5mg  daily, metoprolol 50mg  TID. Pt states she just "doesn't feel good". Occasional lightheadedness.  Home BP - 118/86 with HR 60; 106/57 with HR 67   Past Medical History:  Diagnosis Date  . Asthma   . Atrophic vaginitis   . Breast cancer, Left 12/20/2010   NO BLOOD PRESSURE CHECKS OR STICKS IN LEFT ARM  . CAD (coronary artery disease)    a. 01/19/2010 s/p CABG x 3, lima->lad, vg->diag, vg->om1;  b. 06/2011 :Lexi MV: EF 84%, No ischemia. c. 01/06/14 s/p negative nuclear stress test with EF >70%  . Cervical arthritis   . Colonic polyp 04-27-2009   tubular adenoma  . Diverticulosis of colon (without mention of hemorrhage)   . Fibromyalgia   . Gallstones   . GERD (gastroesophageal reflux disease)   . Glaucoma   . Headache(784.0)    a. frequently assocaited with high BPs  . Hiatal hernia   . Hypercholesterolemia   . Irritable bowel syndrome   . Labile hypertension   . Lymphedema    left arm  . Pancreatic cancer ([place]) dx'd 11/2018  . Pinched vertebral nerve   . Secondary diabetes ([place]) 12/03/2018  . Sleep apnea     Past Surgical History:  Procedure Laterality  Date  . [place] STUDY N/A 01/08/2016   Procedure: [place] STUDY;  Surgeon: Ladene Artist, MD;  Location: [facility]COPY;  Service: Endoscopy;  Laterality: N/A;  . BILIARY BRUSHING  11/28/2018   Procedure: BILIARY BRUSHING;  Surgeon: Gatha Mayer, MD;  Location: [facility] ENDOSCOPY;  Service: Endoscopy;;  . BILIARY DILATION  01/06/2019   Procedure: BILIARY DILATION;  Surgeon: Irving Copas., MD;  Location: [facility]OS[facility];  Service: Endoscopy;;  . BILIARY STENT PLACEMENT  11/28/2018   Procedure: BILIARY STENT PLACEMENT;  Surgeon: Gatha Mayer, MD;  Location: [facility] ENDOSCOPY;  Service: Endoscopy;;  . BILIARY STENT PLACEMENT N/A 01/06/2019   Procedure: BILIARY STENT PLACEMENT;  Surgeon: Irving Copas., MD;  Location: [facility];  Service: Endoscopy;  Laterality: N/A;  . BILIARY STENT PLACEMENT N/A 08/25/2019   Procedure: BILIARY STENT PLACEMENT;  Surgeon: Jackquline Denmark, MD;  Location: [facility];  Service: Endoscopy;  Laterality: N/A;  . BREAST LUMPECTOMY Left 02/06/11  . CATARACT EXTRACTION, BILATERAL     bilateral caaract removal,  . COLONOSCOPY    . CORONARY ANGIOPLASTY WITH STENT PLACEMENT     Stent 2007  . CORONARY ARTERY BYPASS GRAFT    . ENDOSCOPIC RETROGRADE CHOLANGIOPANCREATOGRAPHY (ERCP) WITH PROPOFOL N/A 01/06/2019   Procedure: ENDOSCOPIC RETROGRADE CHOLANGIOPANCREATOGRAPHY (ERCP) WITH PROPOFOL;  Surgeon: Rush Landmark Telford Nab., MD;  Location: [facility];  Service: Endoscopy;  Laterality: N/A;  [id number]  . ERCP N/A 11/28/2018   Procedure: ENDOSCOPIC RETROGRADE CHOLANGIOPANCREATOGRAPHY (ERCP);  Surgeon: Gatha Mayer, MD;  Location: [facility] ENDOSCOPY;  Service: Endoscopy;  Laterality: N/A;  . ERCP N/A 08/25/2019   Procedure: ERCP with stent  ;  Surgeon: Jackquline Denmark, MD;  Location: [facility];  Service: Endoscopy;  Laterality: N/A;  . ESOPHAGEAL MANOMETRY N/A 01/08/2016   Procedure: ESOPHAGEAL MANOMETRY (EM);  Surgeon: Ladene Artist, MD;  Location: [facility];   Service: Endoscopy;  Laterality: N/A;  . ESOPHAGOGASTRODUODENOSCOPY    . ESOPHAGOGASTRODUODENOSCOPY N/A 01/06/2019   Procedure: ESOPHAGOGASTRODUODENOSCOPY (EGD);  Surgeon: Rush Landmark Telford Nab., MD;  Location: [facility] END[facility];  Service: Endoscopy;  Laterality: N/A;  . ESOPHAGOGASTRODUODENOSCOPY N/A 08/25/2019   Procedure: ESOPHAGOGASTRODUODENOSCOPY (EGD);  Surgeon: Jackquline Denmark, MD;  Location: [facility] ENDOSCOPY;  Service: Endoscopy;  Laterality: N/A;  . ESOPHAGOGASTRODUODENOSCOPY (EGD) WITH PROPOFOL N/A 12/10/2018   Procedure: ESOPHAGOGASTRODUODENOSCOPY (EGD) WITH PROPOFOL;  Surgeon: [name], MD;  Location: [facility];  Service: Endoscopy;  Laterality: N/A;  . EUS N/A 12/10/2018   Procedure: UPPER ENDOSCOPIC ULTRASOUND (EUS) RADIAL;  Surgeon: [name], MD;  Location: [facility];  Service: Endoscopy;  Laterality: N/A;  . EUS N/A 01/06/2019   Procedure: FULL UPPER ENDOSCOPIC ULTRASOUND (EUS) RADIAL;  Surgeon: Rush Landmark Telford Nab., MD;  Location: [facility];  Service: Endoscopy;  Laterality: N/A;  . FIDUCIAL MARKER PLACEMENT  01/06/2019   Procedure: FIDUCIAL MARKER PLACEMENT;  Surgeon: Rush Landmark Telford Nab., MD;  Location: [facility];  Service: Endoscopy;;  . FINE NEEDLE ASPIRATION N/A 12/10/2018   Procedure: FINE NEEDLE ASPIRATION (FNA) LINEAR;  Surgeon: [name], MD;  Location: [facility];  Service: Endoscopy;  Laterality: N/A;  . REMOVAL OF STONES  08/25/2019   Procedure: REMOVAL OF STONES;  Surgeon: Jackquline Denmark, MD;  Location: [facility];  Service: Endoscopy;;  . [name]  01/06/2019   Procedure: [name];  Surgeon: Irving Copas., MD;  Location: [facility];  Service: Endoscopy;;  . STENT REMOVAL  01/06/2019   Procedure: STENT REMOVAL;  Surgeon: Irving Copas., MD;  Location: [facility];  Service: Endoscopy;;    Family History  Problem Relation Age of Onset  . Stroke Mother   . Stroke Father   . Prostate cancer Father    . Breast cancer Sister   . Diabetes Sister   . Cancer Brother        gland cancer  . Heart disease Brother   . Colon cancer Neg Hx   . Stomach cancer Neg Hx   . Pancreatic cancer Neg Hx   . Kidney disease Neg Hx   . Liver disease Neg Hx     Social History   Tobacco Use  . Smoking status: Never Smoker  . Smokeless tobacco: Never Used  Vaping Use  . Vaping Use: Never assessed  Substance Use Topics  . Alcohol use: No    Alcohol/week: 0.0 standard drinks  . Drug use: No     Current Outpatient Medications:  .  acetaminophen (TYLENOL) 325 MG tablet, Take 650 mg by mouth every 6 (six) hours as needed., Disp: , Rfl:  .  albuterol (VENTOLIN HFA) 108 (90 Base) MCG/ACT inhaler, Inhale 2 puffs into the lungs every 6 (six) hours as needed for wheezing or shortness of breath. , Disp: , Rfl:  .  CALCIUM PO, Take 1 tablet by mouth daily with breakfast. , Disp: , Rfl:  .  EPINEPHrine 0.3 mg/0.3 mL IJ SOAJ injection, Inject 0.3 mg into the muscle as  needed (as directed- FOR A SEVERE REACTION). , Disp: , Rfl:  .  ferrous sulfate 325 (65 FE) MG tablet, Take 1 tablet (325 mg total) by mouth daily with breakfast. Please take with a source of Vitamin C, Disp: 90 tablet, Rfl: 3 .  fexofenadine (ALLEGRA) 180 MG tablet, Take 180 mg by mouth every other day. , Disp: , Rfl:  .  Glucosamine-Chondroit-Vit C-Mn (GLUCOSAMINE CHONDR 1500 COMPLX PO), Take 1 tablet by mouth 2 (two) times daily. , Disp: , Rfl:  .  hydrALAZINE (APRESOLINE) 50 MG tablet, Take 0.5 tablets (25 mg total) by mouth 3 (three) times daily., Disp: 135 tablet, Rfl: 1 .  latanoprost (XALATAN) 0.005 % ophthalmic solution, Place 1 drop into both eyes at bedtime. , Disp: , Rfl:  .  losartan (COZAAR) 50 MG tablet, Take 1 tablet (50 mg total) by mouth daily., Disp: 90 tablet, Rfl: 3 .  metoprolol tartrate (LOPRESSOR) 50 MG tablet, Take 1 tablet (50 mg total) by mouth 3 (three) times daily., Disp: 270 tablet, Rfl: 3 .  nitroGLYCERIN (NITROSTAT)  0.4 MG SL tablet, Place 1 tablet (0.4 mg total) under the tongue every 5 (five) minutes as needed for chest pain., Disp: 25 tablet, Rfl: 2 .  ondansetron (ZOFRAN) 8 MG tablet, Take 1 tablet (8 mg total) by mouth every 8 (eight) hours as needed for nausea or vomiting., Disp: 30 tablet, Rfl: 1 .  oxyCODONE (OXY IR/ROXICODONE) 5 MG immediate release tablet, Take 1 tablet (5 mg total) by mouth every 6 (six) hours as needed for severe pain., Disp: 28 tablet, Rfl: 0 .  pantoprazole (PROTONIX) 40 MG tablet, Take 1 tablet (40 mg total) by mouth daily., Disp: 30 tablet, Rfl: 5 .  Psyllium (METAMUCIL FIBER PO), Take 1 Scoop by mouth daily. Mix one rounded teaspoonful into 8 oz water and drink once a day, Disp: , Rfl:  .  simethicone (MYLICON) 960 MG chewable tablet, Chew 125 mg by mouth every 6 (six) hours as needed for flatulence., Disp: , Rfl:  .  sodium chloride (OCEAN) 0.65 % SOLN nasal spray, Place 1 spray into both nostrils daily as needed for congestion. , Disp: , Rfl:  .  sucralfate (CARAFATE) 1 g tablet, TAKE 1 TABLET(1 GRAM) BY MOUTH TWICE DAILY, Disp: 180 tablet, Rfl: 0 .  traMADol (ULTRAM) 50 MG tablet, Take 1 tablet (50 mg total) by mouth every 6 (six) hours as needed., Disp: 90 tablet, Rfl: 0 .  vitamin B-12 (CYANOCOBALAMIN) 1000 MCG tablet, Take 1,000 mcg by mouth daily.  , Disp: , Rfl:  .  VITAMIN D PO, Take 200 mcg by mouth daily with breakfast. Vitamin A  300 mcg Calcium  40 mcg, Disp: , Rfl:   Allergies  Allergen Reactions  . Choline Fenofibrate Other (See Comments)     pt states INTOL to Trilipix w/ "thigh burning"  . Hctz [Hydrochlorothiazide]     Causes hyponatremia  . Simvastatin Other (See Comments)     pt states INTOL to STATINS \\T \ refuses to restart  . Levaquin [Levofloxacin In D5w]     Elevated BP  . Adhesive [Tape] Rash  . Bentyl [Dicyclomine Hcl] Other (See Comments)    "Made me feel weird and drained me"  . Ceclor [Cefaclor] Rash  . Clarithromycin Rash  . Codeine  Nausea Only  . Doxycycline Rash  . Hydrocodone     Nightmare after taking cough syrup w/hydrocodone  . Lisinopril Cough    Developed ACE cough...  . Penicillins Itching,  Rash and Other (See Comments)    At injection site Did it involve swelling of the face/tongue/throat, SOB, or low BP? No Did it involve sudden or severe rash/hives, skin peeling, or any reaction on the inside of your mouth or nose? No Did you need to seek medical attention at a hospital or doctor's office? No When did it last happen?"It was a long time ago" If all above answers are "NO", may proceed with cephalosporin use.   . Tobramycin-Dexamethasone Rash      ROS: See pertinent positives and negatives per HPI.   EXAM:  VITALS per patient if applicable: BP 638/17 Comment: pt reported  Pulse 60 Comment: pt reported  Ht 4\' 10"  (1.473 m)   Wt 120 lb (54.4 kg) Comment: pt reported  LMP 11/26/1990   BMI 25.08 kg/m    GENERAL: alert, oriented, appears well and in no acute distress  NECK: normal movements of the head and neck  LUNGS: on inspection no signs of respiratory distress, breathing rate appears normal, no obvious gross SOB, gasping or wheezing, no conversational dyspnea  CV: no obvious cyanosis  PSYCH/NEURO: pleasant and cooperative, no obvious depression or anxiety, speech and thought processing grossly intact   ASSESSMENT AND PLAN: 1. Essential hypertension - SBPs have been running low at home and pt not feeling well - d/c amlodipine  Refill: - losartan (COZAAR) 50 MG tablet; Take 1 tablet (50 mg total) by mouth daily.  Dispense: 90 tablet; Refill: 3 - metoprolol tartrate (LOPRESSOR) 50 MG tablet; Take 1 tablet (50 mg total) by mouth 3 (three) times daily.  Dispense: 270 tablet; Refill: 3 Cont: - cont hydralazine 25mg  TID - check BP at home x 1-2 wks and f/u if SBPs still running low and/or pt symptomatic    I discussed the assessment and treatment plan with the patient. The patient was  provided an opportunity to ask questions and all were answered. The patient agreed with the plan and demonstrated an understanding of the instructions.   The patient was advised to call back or seek an in-person evaluation if the symptoms worsen or if the condition fails to improve as anticipated.   Letta Median, DO

## 2019-10-23 ENCOUNTER — Encounter (HOSPITAL_BASED_OUTPATIENT_CLINIC_OR_DEPARTMENT_OTHER): Payer: Self-pay | Admitting: Emergency Medicine

## 2019-10-23 ENCOUNTER — Other Ambulatory Visit: Payer: Self-pay

## 2019-10-23 ENCOUNTER — Emergency Department (HOSPITAL_BASED_OUTPATIENT_CLINIC_OR_DEPARTMENT_OTHER)
Admission: EM | Admit: 2019-10-23 | Discharge: 2019-10-23 | Disposition: A | Discharge status: Home / Self Care | Payer: Medicare Other | Attending: Emergency Medicine | Admitting: Emergency Medicine

## 2019-10-23 ENCOUNTER — Emergency Department (HOSPITAL_BASED_OUTPATIENT_CLINIC_OR_DEPARTMENT_OTHER): Payer: Medicare Other

## 2019-10-23 DIAGNOSIS — Y939 Activity, unspecified: Secondary | ICD-10-CM | POA: Insufficient documentation

## 2019-10-23 DIAGNOSIS — S39012A Strain of muscle, fascia and tendon of lower back, initial encounter: Secondary | ICD-10-CM | POA: Diagnosis not present

## 2019-10-23 DIAGNOSIS — M419 Scoliosis, unspecified: Secondary | ICD-10-CM | POA: Diagnosis not present

## 2019-10-23 DIAGNOSIS — I25810 Atherosclerosis of coronary artery bypass graft(s) without angina pectoris: Secondary | ICD-10-CM | POA: Insufficient documentation

## 2019-10-23 DIAGNOSIS — M533 Sacrococcygeal disorders, not elsewhere classified: Secondary | ICD-10-CM | POA: Insufficient documentation

## 2019-10-23 DIAGNOSIS — J45909 Unspecified asthma, uncomplicated: Secondary | ICD-10-CM | POA: Insufficient documentation

## 2019-10-23 DIAGNOSIS — W133XXA Fall through floor, initial encounter: Secondary | ICD-10-CM | POA: Diagnosis not present

## 2019-10-23 DIAGNOSIS — M25551 Pain in right hip: Secondary | ICD-10-CM | POA: Diagnosis not present

## 2019-10-23 DIAGNOSIS — M4316 Spondylolisthesis, lumbar region: Secondary | ICD-10-CM | POA: Diagnosis not present

## 2019-10-23 DIAGNOSIS — T148XXA Other injury of unspecified body region, initial encounter: Secondary | ICD-10-CM

## 2019-10-23 DIAGNOSIS — M545 Low back pain: Secondary | ICD-10-CM | POA: Insufficient documentation

## 2019-10-23 DIAGNOSIS — Y929 Unspecified place or not applicable: Secondary | ICD-10-CM | POA: Insufficient documentation

## 2019-10-23 DIAGNOSIS — Y999 Unspecified external cause status: Secondary | ICD-10-CM | POA: Insufficient documentation

## 2019-10-23 DIAGNOSIS — S76011A Strain of muscle, fascia and tendon of right hip, initial encounter: Secondary | ICD-10-CM | POA: Diagnosis not present

## 2019-10-23 DIAGNOSIS — Z79899 Other long term (current) drug therapy: Secondary | ICD-10-CM | POA: Diagnosis not present

## 2019-10-23 DIAGNOSIS — I119 Hypertensive heart disease without heart failure: Secondary | ICD-10-CM | POA: Insufficient documentation

## 2019-10-23 DIAGNOSIS — W19XXXA Unspecified fall, initial encounter: Secondary | ICD-10-CM

## 2019-10-23 DIAGNOSIS — Z043 Encounter for examination and observation following other accident: Secondary | ICD-10-CM | POA: Diagnosis not present

## 2019-10-23 DIAGNOSIS — M1611 Unilateral primary osteoarthritis, right hip: Secondary | ICD-10-CM | POA: Diagnosis not present

## 2019-10-23 DIAGNOSIS — S79911A Unspecified injury of right hip, initial encounter: Secondary | ICD-10-CM | POA: Diagnosis present

## 2019-10-23 LAB — URINALYSIS, ROUTINE W REFLEX MICROSCOPIC
Bilirubin Urine: NEGATIVE
Glucose, UA: NEGATIVE mg/dL
Hgb urine dipstick: NEGATIVE
Ketones, ur: NEGATIVE mg/dL
Leukocytes,Ua: NEGATIVE
Nitrite: NEGATIVE
Protein, ur: NEGATIVE mg/dL
Specific Gravity, Urine: 1.01 (ref 1.005–1.030)
pH: 6.5 (ref 5.0–8.0)

## 2019-10-23 MED ORDER — ACETAMINOPHEN 500 MG PO TABS
500.0000 mg | ORAL_TABLET | Freq: Once | ORAL | Status: AC
  Administered 2019-10-23: 500 mg via ORAL
  Filled 2019-10-23: qty 1

## 2019-10-23 MED ORDER — TRAMADOL HCL 50 MG PO TABS
50.0000 mg | ORAL_TABLET | Freq: Four times a day (QID) | ORAL | 0 refills | Status: DC | PRN
Start: 2019-10-23 — End: 2019-10-26

## 2019-10-23 NOTE — Discharge Instructions (Addendum)
Please follow-up with primary doctor regarding your symptoms today.  You can take the prescribed tramadol for breakthrough pain otherwise recommend Tylenol and Motrin.  If you have uncontrolled pain, fevers, abdominal pain, numbness or weakness, return to ER for reassessment.

## 2019-10-23 NOTE — ED Provider Notes (Signed)
[place] [facility] EMERGENCY DEPARTMENT Provider Note   CSN: [id number] Arrival date & time: 10/23/19  1123     History Chief Complaint  Patient presents with  . Fall    [name] is a 84 y.o. female.  Presents to ER after fall.  Reports that she fell on a hardwood floor earlier today, no syncopal episode, landed on her right side.  Denies any head trauma.  She reports some pain over her right buttocks, right hip.  She has been able to bear weight but has had some pain.  Currently mild pain at rest, worse with movement, pain resolved after taking tramadol this morning.  HPI     Past Medical History:  Diagnosis Date  . Asthma   . Atrophic vaginitis   . Breast cancer, Left 12/20/2010   NO BLOOD PRESSURE CHECKS OR STICKS IN LEFT ARM  . CAD (coronary artery disease)    a. 01/19/2010 s/p CABG x 3, lima->lad, vg->diag, vg->om1;  b. 06/2011 :Lexi MV: EF 84%, No ischemia. c. 01/06/14 s/p negative nuclear stress test with EF >70%  . Cervical arthritis   . Colonic polyp 04-27-2009   tubular adenoma  . Diverticulosis of colon (without mention of hemorrhage)   . Fibromyalgia   . Gallstones   . GERD (gastroesophageal reflux disease)   . Glaucoma   . Headache(784.0)    a. frequently assocaited with high BPs  . Hiatal hernia   . Hypercholesterolemia   . Irritable bowel syndrome   . Labile hypertension   . Lymphedema    left arm  . Pancreatic cancer ([place]) dx'd 11/2018  . Pinched vertebral nerve   . Secondary diabetes ([place]) 12/03/2018  . Sleep apnea     Patient Active Problem List   Diagnosis Date Noted  . History of pancreatic cancer   . Upper GI bleed 08/25/2019  . Gastrointestinal hemorrhage 08/24/2019  . Elevated LFTs   . Hematochezia   . Transaminitis 12/16/2018  . Secondary diabetes ([place]) 12/03/2018  . Adenocarcinoma of head of pancreas ([place]) 11/27/2018  . Obstructive jaundice due to malignant neoplasm ([place]) 11/27/2018  . Itching 11/26/2018  . HTN  (hypertension) 04/12/2018  . Lumbar radiculopathy 04/12/2018  . Degeneration of lumbar intervertebral disc 12/16/2017  . Spinal stenosis of lumbar region 12/16/2017  . Osteoarthritis of hip 09/25/2017  . Low back pain 09/25/2017  . S/P CABG (coronary artery bypass graft) 12/19/2016  . Epigastric pain 04/11/2016  . 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  . Heartburn 05/23/2015  . OSA on CPAP 11/23/2014  . Generalized anxiety disorder 02/04/2014  . Labile hypertension   . Fibromyalgia   . Lumbar adjacent segment disease with spondylolisthesis 12/24/2013  . Atherosclerosis of coronary artery bypass graft of native heart with angina pectoris with documented spasm ([place]) 12/24/2013  . Bronchitis, chronic obstructive w acute bronchitis ([place]) 12/21/2013  . Diverticulosis of colon without hemorrhage 11/15/2013  . Hot flashes related to aromatase inhibitor therapy 07/27/2013  . Osteopenia 07/27/2013  . DJD (degenerative joint disease) 09/29/2012  . Breast cancer, Left 12/20/2010  . FATTY LIVER DISEASE 01/09/2010  . Allergic rhinitis 02/18/2007  . DIZZINESS, CHRONIC 02/18/2007  . Headache in back of head 02/18/2007  . Mixed hyperlipidemia 12/23/2006  . GLAUCOMA 12/23/2006  . Coronary atherosclerosis 12/23/2006  . Asthma 12/23/2006  . Gastroesophageal reflux disease 12/23/2006  . IRRITABLE BOWEL SYNDROME 12/23/2006  . Claudication ([place]) 12/23/2006    Past Surgical History:  Procedure Laterality Date  . [place] STUDY N/A 01/08/2016  Procedure: [place] STUDY;  Surgeon: Ladene Artist, MD;  Location: [facility] ENDOSCOPY;  Service: Endoscopy;  Laterality: N/A;  . BILIARY BRUSHING  11/28/2018   Procedure: BILIARY BRUSHING;  Surgeon: Gatha Mayer, MD;  Location: [facility] ENDOSCOPY;  Service: Endoscopy;;  . BILIARY DILATION  01/06/2019   Procedure: BILIARY DILATION;  Surgeon: Irving Copas., MD;  Location: [facility] ENDOSCOPY;  Service: Endoscopy;;  . BILIARY STENT PLACEMENT  11/28/2018    Procedure: BILIARY STENT PLACEMENT;  Surgeon: Gatha Mayer, MD;  Location: [facility] ENDOSCOPY;  Service: Endoscopy;;  . BILIARY STENT PLACEMENT N/A 01/06/2019   Procedure: BILIARY STENT PLACEMENT;  Surgeon: Irving Copas., MD;  Location: [facility] ENDOSCOPY;  Service: Endoscopy;  Laterality: N/A;  . BILIARY STENT PLACEMENT N/A 08/25/2019   Procedure: BILIARY STENT PLACEMENT;  Surgeon: Jackquline Denmark, MD;  Location: [facility] ENDOSCOPY;  Service: Endoscopy;  Laterality: N/A;  . BREAST LUMPECTOMY Left 02/06/11  . CATARACT EXTRACTION, BILATERAL     bilateral caaract removal,  . COLONOSCOPY    . CORONARY ANGIOPLASTY WITH STENT PLACEMENT     Stent 2007  . CORONARY ARTERY BYPASS GRAFT    . ENDOSCOPIC RETROGRADE CHOLANGIOPANCREATOGRAPHY (ERCP) WITH PROPOFOL N/A 01/06/2019   Procedure: ENDOSCOPIC RETROGRADE CHOLANGIOPANCREATOGRAPHY (ERCP) WITH PROPOFOL;  Surgeon: Rush Landmark Telford Nab., MD;  Location: [facility] ENDOSCOPY;  Service: Endoscopy;  Laterality: N/A;  [id number]  . ERCP N/A 11/28/2018   Procedure: ENDOSCOPIC RETROGRADE CHOLANGIOPANCREATOGRAPHY (ERCP);  Surgeon: Gatha Mayer, MD;  Location: [facility] ENDOSCOPY;  Service: Endoscopy;  Laterality: N/A;  . ERCP N/A 08/25/2019   Procedure: ERCP with stent  ;  Surgeon: Jackquline Denmark, MD;  Location: [facility] ENDOSCOPY;  Service: Endoscopy;  Laterality: N/A;  . ESOPHAGEAL MANOMETRY N/A 01/08/2016   Procedure: ESOPHAGEAL MANOMETRY (EM);  Surgeon: Ladene Artist, MD;  Location: [facility] ENDOSCOPY;  Service: Endoscopy;  Laterality: N/A;  . ESOPHAGOGASTRODUODENOSCOPY    . ESOPHAGOGASTRODUODENOSCOPY N/A 01/06/2019   Procedure: ESOPHAGOGASTRODUODENOSCOPY (EGD);  Surgeon: Rush Landmark Telford Nab., MD;  Location: [facility] ENDOSCOPY;  Service: Endoscopy;  Laterality: N/A;  . ESOPHAGOGASTRODUODENOSCOPY N/A 08/25/2019   Procedure: ESOPHAGOGASTRODUODENOSCOPY (EGD);  Surgeon: Jackquline Denmark, MD;  Location: [facility] ENDOSCOPY;  Service: Endoscopy;  Laterality: N/A;  . ESOPHAGOGASTRODUODENOSCOPY (EGD) WITH PROPOFOL  N/A 12/10/2018   Procedure: ESOPHAGOGASTRODUODENOSCOPY (EGD) WITH PROPOFOL;  Surgeon: [name], MD;  Location: [facility] ENDOSCOPY;  Service: Endoscopy;  Laterality: N/A;  . EUS N/A 12/10/2018   Procedure: UPPER ENDOSCOPIC ULTRASOUND (EUS) RADIAL;  Surgeon: [name], MD;  Location: [facility] ENDOSCOPY;  Service: Endoscopy;  Laterality: N/A;  . EUS N/A 01/06/2019   Procedure: FULL UPPER ENDOSCOPIC ULTRASOUND (EUS) RADIAL;  Surgeon: Rush Landmark Telford Nab., MD;  Location: [facility] ENDOSCOPY;  Service: Endoscopy;  Laterality: N/A;  . FIDUCIAL MARKER PLACEMENT  01/06/2019   Procedure: FIDUCIAL MARKER PLACEMENT;  Surgeon: Rush Landmark Telford Nab., MD;  Location: [facility] ENDOSCOPY;  Service: Endoscopy;;  . FINE NEEDLE ASPIRATION N/A 12/10/2018   Procedure: FINE NEEDLE ASPIRATION (FNA) LINEAR;  Surgeon: [name], MD;  Location: [facility] ENDOSCOPY;  Service: Endoscopy;  Laterality: N/A;  . REMOVAL OF STONES  08/25/2019   Procedure: REMOVAL OF STONES;  Surgeon: Jackquline Denmark, MD;  Location: [facility] ENDOSCOPY;  Service: Endoscopy;;  . [name]  01/06/2019   Procedure: [name];  Surgeon: Irving Copas., MD;  Location: [facility] ENDOSCOPY;  Service: Endoscopy;;  . STENT REMOVAL  01/06/2019   Procedure: STENT REMOVAL;  Surgeon: Irving Copas., MD;  Location: [facility] ENDOSCOPY;  Service: Endoscopy;;     OB History  Gravida  3   Para  2   Term  2   Preterm  0   AB  1   Living  2     SAB  0   TAB  0   Ectopic  0   Multiple  0   Live Births  2           Family History  Problem Relation Age of Onset  . Stroke Mother   . Stroke Father   . Prostate cancer Father   . Breast cancer Sister   . Diabetes Sister   . Cancer Brother        gland cancer  . Heart disease Brother   . Colon cancer Neg Hx   . Stomach cancer Neg Hx   . Pancreatic cancer Neg Hx   . Kidney disease Neg Hx   . Liver disease Neg Hx     Social History   Tobacco Use  . Smoking status: Never Smoker    . Smokeless tobacco: Never Used  Vaping Use  . Vaping Use: Never assessed  Substance Use Topics  . Alcohol use: No    Alcohol/week: 0.0 standard drinks  . Drug use: No    Home Medications Prior to Admission medications   Medication Sig Start Date End Date Taking? Authorizing Provider  acetaminophen (TYLENOL) 325 MG tablet Take 650 mg by mouth every 6 (six) hours as needed.    [provider]  albuterol (VENTOLIN HFA) 108 (90 Base) MCG/ACT inhaler Inhale 2 puffs into the lungs every 6 (six) hours as needed for wheezing or shortness of breath.     [provider]  CALCIUM PO Take 1 tablet by mouth daily with breakfast.     [provider]  EPINEPHrine 0.3 mg/0.3 mL IJ SOAJ injection Inject 0.3 mg into the muscle as needed (as directed- FOR A SEVERE REACTION).  04/01/18   [provider]  ferrous sulfate 325 (65 FE) MG tablet Take 1 tablet (325 mg total) by mouth daily with breakfast. Please take with a source of Vitamin C 10/06/19   Orson Slick, MD  fexofenadine (ALLEGRA) 180 MG tablet Take 180 mg by mouth every other day.     [provider]  Glucosamine-Chondroit-Vit C-Mn (GLUCOSAMINE CHONDR 1500 COMPLX PO) Take 1 tablet by mouth 2 (two) times daily.     [provider]  hydrALAZINE (APRESOLINE) 50 MG tablet Take 0.5 tablets (25 mg total) by mouth 3 (three) times daily. 09/24/19   Cirigliano, Mary K, DO  latanoprost (XALATAN) 0.005 % ophthalmic solution Place 1 drop into both eyes at bedtime.  07/31/12   [provider]  losartan (COZAAR) 50 MG tablet Take 1 tablet (50 mg total) by mouth daily. 10/21/19   Cirigliano, Garvin Fila, DO  metoprolol tartrate (LOPRESSOR) 50 MG tablet Take 1 tablet (50 mg total) by mouth 3 (three) times daily. 10/21/19   Cirigliano, Garvin Fila, DO  nitroGLYCERIN (NITROSTAT) 0.4 MG SL tablet Place 1 tablet (0.4 mg total) under the tongue every 5 (five) minutes as needed for chest pain. 12/18/18   Josue Hector,  MD  ondansetron (ZOFRAN) 8 MG tablet Take 1 tablet (8 mg total) by mouth every 8 (eight) hours as needed for nausea or vomiting. 09/03/19   Orson Slick, MD  oxyCODONE (OXY IR/ROXICODONE) 5 MG immediate release tablet Take 1 tablet (5 mg total) by mouth every 6 (six) hours as needed for severe pain. 09/01/19  Orson Slick, MD  pantoprazole (PROTONIX) 40 MG tablet Take 1 tablet (40 mg total) by mouth daily. 10/06/19 11/05/19  Orson Slick, MD  Psyllium (METAMUCIL FIBER PO) Take 1 Scoop by mouth daily. Mix one rounded teaspoonful into 8 oz water and drink once a day    [provider]  simethicone (MYLICON) 956 MG chewable tablet Chew 125 mg by mouth every 6 (six) hours as needed for flatulence.    [provider]  sodium chloride (OCEAN) 0.65 % SOLN nasal spray Place 1 spray into both nostrils daily as needed for congestion.     [provider]  sucralfate (CARAFATE) 1 g tablet TAKE 1 TABLET(1 GRAM) BY MOUTH TWICE DAILY 09/23/19   Ladene Artist, MD  traMADol (ULTRAM) 50 MG tablet Take 1 tablet (50 mg total) by mouth every 6 (six) hours as needed. 10/06/19   Orson Slick, MD  vitamin B-12 (CYANOCOBALAMIN) 1000 MCG tablet Take 1,000 mcg by mouth daily.      [provider]  VITAMIN D PO Take 200 mcg by mouth daily with breakfast. Vitamin A  300 mcg Calcium  40 mcg    [provider]    Allergies    Choline fenofibrate, Hctz [hydrochlorothiazide], Simvastatin, Levaquin [levofloxacin in d5w], Adhesive [tape], Bentyl [dicyclomine hcl], Ceclor [cefaclor], Clarithromycin, Codeine, Doxycycline, Hydrocodone, Lisinopril, Penicillins, and Tobramycin-dexamethasone  Review of Systems   Review of Systems  Constitutional: Negative for chills and fever.  HENT: Negative for ear pain and sore throat.   Eyes: Negative for pain and visual disturbance.  Respiratory: Negative for cough and shortness of breath.   Cardiovascular: Negative for chest pain and  palpitations.  Gastrointestinal: Negative for abdominal pain and vomiting.  Genitourinary: Negative for dysuria and hematuria.  Musculoskeletal: Positive for arthralgias and back pain.  Skin: Negative for color change and rash.  Neurological: Negative for seizures and syncope.  All other systems reviewed and are negative.   Physical Exam Updated Vital Signs BP (!) 178/76 (BP Location: Right Arm)   Pulse 61   Temp 98.3 F (36.8 C) (Oral)   Resp 16   Ht 4\' 10"  (1.473 m)   Wt 54.4 kg   LMP 11/26/1990   SpO2 99%   BMI 25.08 kg/m   Physical Exam Vitals and nursing note reviewed.  Constitutional:      General: She is not in acute distress.    Appearance: She is well-developed.  HENT:     Head: Normocephalic and atraumatic.  Eyes:     Conjunctiva/sclera: Conjunctivae normal.  Cardiovascular:     Rate and Rhythm: Normal rate and regular rhythm.     Heart sounds: No murmur heard.   Pulmonary:     Effort: Pulmonary effort is normal. No respiratory distress.     Breath sounds: Normal breath sounds.  Abdominal:     Palpations: Abdomen is soft.     Tenderness: There is no abdominal tenderness.  Musculoskeletal:     Cervical back: Neck supple.     Comments: Back: some TTP over lower L spine, no C, T spine TTP, no step off or deformity RUE: no TTP throughout, no deformity, normal joint ROM, radial pulse intact, distal sensation and motor intact LUE: no TTP throughout, no deformity, normal joint ROM, radial pulse intact, distal sensation and motor intact RLE:  TTP over R hip, no deformity, normal joint ROM, distal pulse, sensation and motor intact LLE: no TTP throughout, no deformity, normal joint  ROM, distal pulse, sensation and motor intact  Skin:    General: Skin is warm and dry.  Neurological:     Mental Status: She is alert.     ED Results / Procedures / Treatments   Labs (all labs ordered are listed, but only abnormal results are displayed) Labs Reviewed  URINALYSIS,  ROUTINE W REFLEX MICROSCOPIC    EKG None  Radiology DG Lumbar Spine Complete  Result Date: 10/23/2019 CLINICAL DATA:  Post fall, now with pain involving the low back, right hip and coccyx. EXAM: LUMBAR SPINE - COMPLETE 4+ VIEW COMPARISON:  Right hip radiographs-earlier same day; CT abdomen pelvis-08/24/2019 FINDINGS: There are 5 non rib-bearing lumbar type vertebral bodies Mild scoliotic curvature of the thoracolumbar spine with dominant caudal component convex to the right measuring approximately 6 degrees (as measured from the superior endplate of L2 to the inferior endplate of L4). Grade 1 anterolisthesis of L4 upon L5 measuring approximately 7 mm without associated pars defects. No retrolisthesis. Mild (approximately 25%) compression deformity involving the L5 vertebral body appears similar to abdominal CT performed 08/24/2019. Remaining lumbar vertebral body heights appear preserved. Mild to moderate multilevel lumbar spine DDD, worse at L1-L2, L4-L5 and L5-S1 with disc space height loss, endplate irregularity and sclerosis. Limited visualization of the bilateral SI joints, sacrum and coccyx is normal. A biliary stent overlies expected location of the CBD with expected pneumobilia. Nonobstructive bowel gas pattern. IMPRESSION: 1. No definite acute findings. 2. Mild-to-moderate multilevel lumbar spine DDD worse at L1-L2, L4-L5 and L5-S1. Electronically Signed   By: Sandi Mariscal M.D.   On: 10/23/2019 13:47   DG Hip Unilat W or Wo Pelvis 1 View Right  Result Date: 10/23/2019 CLINICAL DATA:  Post fall, now with right hip pain. EXAM: DG HIP (WITH OR WITHOUT PELVIS) 1V RIGHT COMPARISON:  CT abdomen pelvis-08/24/2019; chest radiograph-08/25/2019 FINDINGS: No fracture or dislocation. Moderate degenerative change of the right hip with joint space loss, subchondral sclerosis and osteophytosis. No evidence of avascular necrosis. Limited visualization of the pelvis is normal. Similar moderate degenerative change  of the contralateral left hip is suspected though incompletely evaluated. Degenerative change of the lower lumbar spine is suspected though incompletely evaluated. Surgical clips overlie the medial aspect of the right thigh likely the sequela of previous venous harvesting in the setting of known CABG. Regional soft tissues appear otherwise normal. IMPRESSION: 1. No acute findings. 2. Moderate degenerative change of the right hip. Electronically Signed   By: Sandi Mariscal M.D.   On: 10/23/2019 13:49    Procedures Procedures (including critical care time)  Medications Ordered in ED Medications  acetaminophen (TYLENOL) tablet 500 mg (500 mg Oral Given 10/23/19 1430)    ED Course  I have reviewed the triage vital signs and the nursing notes.  Pertinent labs & imaging results that were available during my care of the patient were reviewed by me and considered in my medical decision making (see chart for details).    MDM Rules/Calculators/A&P                         84 year old lady presents to ER with concern for fall.  Denies syncope, head trauma. On exam, noted some tenderness over her lower back, right hip.  Patient is otherwise well-appearing, ambulatory without difficulty.  Plain films were negative.  Pain is currently well controlled, will discharge home, recommended PCP follow-up.    After the discussed management above, the patient was determined to be  safe for discharge.  The patient was in agreement with this plan and all questions regarding their care were answered.  ED return precautions were discussed and the patient will return to the ED with any significant worsening of condition.  Final Clinical Impression(s) / ED Diagnoses Final diagnoses:  Fall, initial encounter  Muscle strain    Rx / DC Orders ED Discharge Orders    None       Lucrezia Starch, MD 10/24/19 1037

## 2019-10-23 NOTE — ED Triage Notes (Addendum)
Fell on [employer] floor today. C/o coccyx pain and low back pain. Also reports R leg pain. She took a tramadol at 9am.

## 2019-10-25 ENCOUNTER — Telehealth: Payer: Self-pay | Admitting: *Deleted

## 2019-10-25 ENCOUNTER — Other Ambulatory Visit: Payer: Self-pay | Admitting: *Deleted

## 2019-10-25 NOTE — Patient Outreach (Addendum)
[place] [facility]) Care Management  [name]  10/25/2019   [name] 07-23-1935 [id number]  Subjective:  Received voicemail message from patient's daughter [name]), states her father's insurance is through [employer].  Sent emails to [facility] Clinical Director and [facility] regarding verification of patient's spouse [name]) eligibility for [place] Management program. RNCM advised patient's spouse not eligible for [facility] Care Management program and to reiterate community pharmacy resources, such as [facility],  patient assistance programs, and not eligible for [facility] pharmacy resources.    Telephone call from patient's daughter [name]) home / mobile number  spoke with daughter, she stated patient's name, date of birth, and address.  Daughter advised of above update regarding patient's spouse eligibility status with [facility] Care Management and available community pharmacy resources, daughter voiced understanding.    Daughter states patient is doing ok, having pain, pain being managed with medication,  had recent fall on 10/23/2019 with ED visit evaluation, and will have a follow up visit with primary MD on 10/27/2019.    Daughter states that 10/21/2019 primary MD visit went well, 1 blood pressure medication discontinued, and blood pressures will continue to be monitored.  Discussed patient's pharmacy referral was completed.   Daughter  states she is aware of signs/ symptoms to report, how to reach provider if needed after hours, when to go to ED, and / or call 911.   Daughter states patient does not have any education material, EMMI follow up, care coordination, care management, disease monitoring, transportation, community resource, or pharmacy needs at this time.  States she is very appreciative of the follow up, is in agreement  to receive 1 additional follow up call to assess for further CM needs, and is in agreement to receive  [place] Management EMMI follow up calls as needed.  Daughter has questions regarding [place] [facility] program value and RNCM answered daughter's questions.   Daughter states she is not sure if she wants to continue to follow up on patient's behalf for continued program participation and will assess on next patient's outreach.      Objective: Per KPN (Knowledge Performance Now, point of care tool) and chart review,patient hospitalized 08/24/2019 - 08/28/2019 forupper GI bleed due to tumor ingrowth/ progression into the biliary stent/ complicated with cholangitis,Adenocarcinoma of the pancreas status post uncovered metal biliary stent placement for severe malignant biliary stricture,onNovember 0160, now complicated with acute cholangitis/sepsis (endorgan failure lactic acidosis in transit hypoxic respiratory failure). Patient also has a history the following: hypertension, CAD status post bypass grafting,breast cancer status postlumpectomy, Asthma, OSA on CPAP,pancreatic adenocarcinoma, andChronic anemia with iron deficiency.    Encounter Medications:  Outpatient Encounter Medications as of 10/25/2019  Medication Sig Note  . acetaminophen (TYLENOL) 325 MG tablet Take 650 mg by mouth every 6 (six) hours as needed.   [name] albuterol (VENTOLIN HFA) 108 (90 Base) MCG/ACT inhaler Inhale 2 puffs into the lungs every 6 (six) hours as needed for wheezing or shortness of breath.    . ferrous sulfate 325 (65 FE) MG tablet Take 1 tablet (325 mg total) by mouth daily with breakfast. Please take with a source of Vitamin C 10/20/2019: Daughter states not sure, will verify.   . fexofenadine (ALLEGRA) 180 MG tablet Take 180 mg by mouth every other day.    . Glucosamine-Chondroit-Vit C-Mn (GLUCOSAMINE CHONDR 1500 COMPLX PO) Take 1 tablet by mouth 2 (two) times daily.  10/20/2019: Daughter states not sure  will check on.   . hydrALAZINE (APRESOLINE) 50 MG tablet Take 0.5 tablets (25 mg total) by mouth 3 (three)  times daily. 10/06/2019: 1/2 tablet at night only  . latanoprost (XALATAN) 0.005 % ophthalmic solution Place 1 drop into both eyes at bedtime.    [name] losartan (COZAAR) 50 MG tablet Take 1 tablet (50 mg total) by mouth daily.   . metoprolol tartrate (LOPRESSOR) 50 MG tablet Take 1 tablet (50 mg total) by mouth 3 (three) times daily.   . ondansetron (ZOFRAN) 8 MG tablet Take 1 tablet (8 mg total) by mouth every 8 (eight) hours as needed for nausea or vomiting.   [name] oxyCODONE (OXY IR/ROXICODONE) 5 MG immediate release tablet Take 1 tablet (5 mg total) by mouth every 6 (six) hours as needed for severe pain.   . pantoprazole (PROTONIX) 40 MG tablet Take 1 tablet (40 mg total) by mouth daily. 10/20/2019: Daughter states not sure, will verify.   . Psyllium (METAMUCIL FIBER PO) Take 1 Scoop by mouth daily. Mix one rounded teaspoonful into 8 oz water and drink once a day   . simethicone (MYLICON) 161 MG chewable tablet Chew 125 mg by mouth every 6 (six) hours as needed for flatulence.   . sodium chloride (OCEAN) 0.65 % SOLN nasal spray Place 1 spray into both nostrils daily as needed for congestion.    . sucralfate (CARAFATE) 1 g tablet TAKE 1 TABLET(1 GRAM) BY MOUTH TWICE DAILY   . traMADol (ULTRAM) 50 MG tablet Take 1 tablet (50 mg total) by mouth every 6 (six) hours as needed for up to 3 days.   . vitamin B-12 (CYANOCOBALAMIN) 1000 MCG tablet Take 1,000 mcg by mouth daily.   10/20/2019: Daughter not sure will follow up.   [name] VITAMIN D PO Take 200 mcg by mouth daily with breakfast. Vitamin A  300 mcg Calcium  40 mcg 10/20/2019: Daughter not sure will follow up.   [name] CALCIUM PO Take 1 tablet by mouth daily with breakfast.  (Patient not taking: Reported on 10/25/2019) 09/21/2019: States currently out and planning to buy more in the near future.   [name] EPINEPHrine 0.3 mg/0.3 mL IJ SOAJ injection Inject 0.3 mg into the muscle as needed (as directed- FOR A SEVERE REACTION).  (Patient not taking: Reported on 10/25/2019)  10/20/2019: Daughter states has not needed.   . nitroGLYCERIN (NITROSTAT) 0.4 MG SL tablet Place 1 tablet (0.4 mg total) under the tongue every 5 (five) minutes as needed for chest pain. (Patient not taking: Reported on 10/25/2019) 10/20/2019: Daughter states has not needed.    No facility-administered encounter medications on file as of 10/25/2019.    Functional Status:  In your present state of health, do you have any difficulty performing the following activities: 08/24/2019 08/24/2019  Hearing? - N  Vision? - N  Difficulty concentrating or making decisions? - N  Walking or climbing stairs? - N  Dressing or bathing? - N  Doing errands, shopping? N -  Some recent data might be hidden    Fall/Depression Screening: Fall Risk  10/20/2019 09/21/2019 09/21/2018  Falls in the past year? 0 0 0  Number falls in past yr: 0 0 0  Injury with Fall? 0 0 0  Risk for fall due to : No Fall Risks Other (Comment) -  Risk for fall due to: Comment - Recent hospitalization -  Follow up Falls evaluation completed;Education provided Falls evaluation completed;Education provided;Falls prevention discussed Falls evaluation completed   PHQ 2/9 Scores 09/21/2019  05/13/2019 04/13/2018 12/20/2015 08/11/2013 07/03/2012  PHQ - 2 Score 0 0 0 0 0 1    Assessment: Received Medicare EMMI General Discharge Red Flag Alert follow up referral on 09/03/2019. Red Flag Alert Trigger, Day #4, patient answered no to the following question: Scheduled follow-up?EMMI follow up completed and will continue to follow up for pancreatic cancer care coordination, and other disease education/ monitoring as needed.   Goals    .  HDL > 40    .  LDL CALC < 70    .  Patient's daughter stated goal is to maintain patient's quality of life. (pt-stated)      CARE PLAN ENTRY (see longitudinal plan of care for additional care plan information)  Current Barriers:  [name] Knowledge Deficits related to pancreatic cancer.  Nurse Case Manager Clinical  Goal(s):  [name] Over the next 60 days, patient will not experience hospital admission. Hospital Admissions in last 6 months = 2 . Over the next 30 days, patient will attend all scheduled medical appointments: primary MD, oncologist, and gastroenterologist.  Interventions:  . Inter-disciplinary care team collaboration (see longitudinal plan of care) . Reviewed medications with patient and discussed with patient's daughter. . Advised patient, providing education and rationale, to monitor blood pressure daily and record, calling primary MD for findings outside established parameters.  . Reviewed scheduled/upcoming provider appointments including:  . Pharmacy referral for medication review and patient assistance program identification.  Patient Self Care Activities:  . Self administers medications as prescribed . Attends all scheduled provider appointments . Performs ADL's independently . Per patient's daughter, patient may not be aware of severity of illness at all times.   Updated 10/25/2019          Plan: RNCM will route assessment today due to updated assessment.  RNCM will call patient's daughter / designated party release [name]) for telephone outreach attempt, within 30 business days, other diagnosis care coordination follow up. and proceed with case closure, after 4th unsuccessful outreach call.       Abdelaziz Westenberger H. Annia Friendly, BSN, [place] [facility] Telephonic CM Phone: [phone] Fax: [phone]

## 2019-10-25 NOTE — Telephone Encounter (Signed)
Received call from pt's daughter, [name].   She states that her mom had a fall over the weekend and went to the ED. No fractures but has some strained muscles in her hip.  [name] was asking about refills on her mother' Tramadol. Advised that it appears it was sent to [facility] on [address], not [employer].  Advised that only 8 tablets were sent in a 10/23/19.  [name]  States that they will likely need a refill by Wednesday or Thursday. Advised that she can also use the oxycodone for severe pain. [name] voiced understanding.  Advised that I would let Dr. Lorenso Courier know of  Refill need for later in the week.

## 2019-10-26 ENCOUNTER — Other Ambulatory Visit: Payer: Self-pay

## 2019-10-26 ENCOUNTER — Other Ambulatory Visit: Payer: Self-pay | Admitting: Hematology and Oncology

## 2019-10-26 MED ORDER — TRAMADOL HCL 50 MG PO TABS
50.0000 mg | ORAL_TABLET | Freq: Four times a day (QID) | ORAL | 0 refills | Status: DC | PRN
Start: 2019-10-26 — End: 2019-11-10

## 2019-10-27 ENCOUNTER — Encounter: Payer: Self-pay | Admitting: Family Medicine

## 2019-10-27 ENCOUNTER — Ambulatory Visit (INDEPENDENT_AMBULATORY_CARE_PROVIDER_SITE_OTHER): Payer: Medicare Other | Admitting: Family Medicine

## 2019-10-27 VITALS — BP 132/68 | HR 66 | Temp 97.8°F | Ht <= 58 in | Wt 119.8 lb

## 2019-10-27 DIAGNOSIS — W19XXXS Unspecified fall, sequela: Secondary | ICD-10-CM

## 2019-10-27 DIAGNOSIS — M533 Sacrococcygeal disorders, not elsewhere classified: Secondary | ICD-10-CM | POA: Diagnosis not present

## 2019-10-27 NOTE — Progress Notes (Addendum)
[name] is a 84 y.o. female  Chief Complaint  Patient presents with  . Hospitalization Follow-up    fall-x-rays at med center//right side pain and head tightness//pt said thighs, hips an mid back hurts    HPI: [name] MUHLBAUER is a 84 y.o. female here for ER f/u after a fall on 10/23/19. She was at home, lost her balance trying to take socks off while standing up, fell on hardwood floor and landed on her Rt side. At [facility], pt has xrays of lumbar spine and Rt hip/pelvis done - no fracture. These images along with notes from ER have been reviewed by me today.   Today, pt reports feeling sore in her low back and tailbone. She is taking tramadol for pain and also takes oxycodone at night for pain and sleep. This has been her regimen per oncology even prior to fall. She is also using heating pad and topical muscle rub.  Pt denies usually feeling unsteady or off balance. She denies dizziness or lightheadedness.   Past Medical History:  Diagnosis Date  . Asthma   . Atrophic vaginitis   . Breast cancer, Left 12/20/2010   NO BLOOD PRESSURE CHECKS OR STICKS IN LEFT ARM  . CAD (coronary artery disease)    a. 01/19/2010 s/p CABG x 3, lima->lad, vg->diag, vg->om1;  b. 06/2011 :Lexi MV: EF 84%, No ischemia. c. 01/06/14 s/p negative nuclear stress test with EF >70%  . Cervical arthritis   . Colonic polyp 04-27-2009   tubular adenoma  . Diverticulosis of colon (without mention of hemorrhage)   . Fibromyalgia   . Gallstones   . GERD (gastroesophageal reflux disease)   . Glaucoma   . Headache(784.0)    a. frequently assocaited with high BPs  . Hiatal hernia   . Hypercholesterolemia   . Irritable bowel syndrome   . Labile hypertension   . Lymphedema    left arm  . Pancreatic cancer ([place]) dx'd 11/2018  . Pinched vertebral nerve   . Secondary diabetes ([place]) 12/03/2018  . Sleep apnea     Past Surgical History:  Procedure Laterality Date  . [place] STUDY N/A 01/08/2016     Procedure: [place] STUDY;  Surgeon: Ladene Artist, MD;  Location: [facility] ENDOSCOPY;  Service: Endoscopy;  Laterality: N/A;  . BILIARY BRUSHING  11/28/2018   Procedure: BILIARY BRUSHING;  Surgeon: Gatha Mayer, MD;  Location: [facility] ENDOSCOPY;  Service: Endoscopy;;  . BILIARY DILATION  01/06/2019   Procedure: BILIARY DILATION;  Surgeon: Irving Copas., MD;  Location: [facility] ENDOSCOPY;  Service: Endoscopy;;  . BILIARY STENT PLACEMENT  11/28/2018   Procedure: BILIARY STENT PLACEMENT;  Surgeon: Gatha Mayer, MD;  Location: [facility] ENDOSCOPY;  Service: Endoscopy;;  . BILIARY STENT PLACEMENT N/A 01/06/2019   Procedure: BILIARY STENT PLACEMENT;  Surgeon: Irving Copas., MD;  Location: [facility] ENDOSCOPY;  Service: Endoscopy;  Laterality: N/A;  . BILIARY STENT PLACEMENT N/A 08/25/2019   Procedure: BILIARY STENT PLACEMENT;  Surgeon: Jackquline Denmark, MD;  Location: [facility] ENDOSCOPY;  Service: Endoscopy;  Laterality: N/A;  . BREAST LUMPECTOMY Left 02/06/11  . CATARACT EXTRACTION, BILATERAL     bilateral caaract removal,  . COLONOSCOPY    . CORONARY ANGIOPLASTY WITH STENT PLACEMENT     Stent 2007  . CORONARY ARTERY BYPASS GRAFT    . ENDOSCOPIC RETROGRADE CHOLANGIOPANCREATOGRAPHY (ERCP) WITH PROPOFOL N/A 01/06/2019   Procedure: ENDOSCOPIC RETROGRADE CHOLANGIOPANCREATOGRAPHY (ERCP) WITH PROPOFOL;  Surgeon: Irving Copas., MD;  Location: [facility] ENDOS[facility];  Service: Endoscopy;  Laterality: N/A;  [id number]  . ERCP N/A 11/28/2018   Procedure: ENDOSCOPIC RETROGRADE CHOLANGIOPANCREATOGRAPHY (ERCP);  Surgeon: Gatha Mayer, MD;  Location: [facility] ENDOSCOPY;  Service: Endoscopy;  Laterality: N/A;  . ERCP N/A 08/25/2019   Procedure: ERCP with stent  ;  Surgeon: Jackquline Denmark, MD;  Location: [facility] ENDOS[facility];  Service: Endoscopy;  Laterality: N/A;  . ESOPHAGEAL MANOMETRY N/A 01/08/2016   Procedure: ESOPHAGEAL MANOMETRY (EM);  Surgeon: Ladene Artist, MD;  Location: [facility];  Service: Endoscopy;  Laterality: N/A;  .  ESOPHAGOGASTRODUODENOSCOPY    . ESOPHAGOGASTRODUODENOSCOPY N/A 01/06/2019   Procedure: ESOPHAGOGASTRODUODENOSCOPY (EGD);  Surgeon: Rush Landmark Telford Nab., MD;  Location: [facility] ENDOSCOPY;  Service: Endoscopy;  Laterality: N/A;  . ESOPHAGOGASTRODUODENOSCOPY N/A 08/25/2019   Procedure: ESOPHAGOGASTRODUODENOSCOPY (EGD);  Surgeon: Jackquline Denmark, MD;  Location: [facility] ENDOSCOPY;  Service: Endoscopy;  Laterality: N/A;  . ESOPHAGOGASTRODUODENOSCOPY (EGD) WITH PROPOFOL N/A 12/10/2018   Procedure: ESOPHAGOGASTRODUODENOSCOPY (EGD) WITH PROPOFOL;  Surgeon: [name], MD;  Location: [facility] END[facility];  Service: Endoscopy;  Laterality: N/A;  . EUS N/A 12/10/2018   Procedure: UPPER ENDOSCOPIC ULTRASOUND (EUS) RADIAL;  Surgeon: [name], MD;  Location: [facility] ENDOSCOPY;  Service: Endoscopy;  Laterality: N/A;  . EUS N/A 01/06/2019   Procedure: FULL UPPER ENDOSCOPIC ULTRASOUND (EUS) RADIAL;  Surgeon: Rush Landmark Telford Nab., MD;  Location: [facility];  Service: Endoscopy;  Laterality: N/A;  . FIDUCIAL MARKER PLACEMENT  01/06/2019   Procedure: FIDUCIAL MARKER PLACEMENT;  Surgeon: Rush Landmark Telford Nab., MD;  Location: [facility] ENDOSCOPY;  Service: Endoscopy;;  . FINE NEEDLE ASPIRATION N/A 12/10/2018   Procedure: FINE NEEDLE ASPIRATION (FNA) LINEAR;  Surgeon: [name], MD;  Location: [facility] ENDOS[facility];  Service: Endoscopy;  Laterality: N/A;  . REMOVAL OF STONES  08/25/2019   Procedure: REMOVAL OF STONES;  Surgeon: Jackquline Denmark, MD;  Location: [facility] ENDOS[facility];  Service: Endoscopy;;  . [name]  01/06/2019   Procedure: [name];  Surgeon: Irving Copas., MD;  Location: [facility] END[facility];  Service: Endoscopy;;  . STENT REMOVAL  01/06/2019   Procedure: STENT REMOVAL;  Surgeon: Irving Copas., MD;  Location: [facility] ENDOSCOPY;  Service: Endoscopy;;    Social History   Socioeconomic History  . Marital status: Married    Spouse name: Gildardo Griffes. [place]  . Number of children: 2  . Years of education:  11  . Highest education level: Not on file  Occupational History  . Occupation: retired    Fish farm manager: [employer]  Tobacco Use  . Smoking status: Never Smoker  . Smokeless tobacco: Never Used  Vaping Use  . Vaping Use: Never assessed  Substance and Sexual Activity  . Alcohol use: No    Alcohol/week: 0.0 standard drinks  . Drug use: No  . Sexual activity: Not Currently    Partners: Male    Birth control/protection: Post-menopausal  Other Topics Concern  . Not on file  Social History Narrative   Patient lives at home with her husband Marcello Moores). 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    Caffeine- sometimes- One cup of coffee.   Right handed.   Two daughters   Four granddaughters.   No EtOH, Tobacco, drugs   Social Determinants of Health   Financial Resource Strain:   . Difficulty of Paying Living Expenses: Not on file  Food Insecurity:   . Worried About Charity fundraiser in the Last Year: Not on file  .  Ran Out of Food in the Last Year: Not on file  Transportation Needs: No Transportation Needs  . Lack of Transportation (Medical): No  . Lack of Transportation (Non-Medical): No  Physical Activity:   . Days of Exercise per Week: Not on file  . Minutes of Exercise per Session: Not on file  Stress:   . Feeling of Stress : Not on file  Social Connections:   . Frequency of Communication with Friends and Family: Not on file  . Frequency of Social Gatherings with Friends and Family: Not on file  . Attends Religious Services: Not on file  . Active Member of Clubs or Organizations: Not on file  . Attends Archivist Meetings: Not on file  . Marital Status: Not on file  Intimate Partner Violence:   . Fear of Current or Ex-Partner: Not on file  . Emotionally Abused: Not on file  . Physically Abused: Not on file  . Sexually Abused: Not on file    Family History  Problem Relation Age of Onset  . Stroke Mother    . Stroke Father   . Prostate cancer Father   . Breast cancer Sister   . Diabetes Sister   . Cancer Brother        gland cancer  . Heart disease Brother   . Colon cancer Neg Hx   . Stomach cancer Neg Hx   . Pancreatic cancer Neg Hx   . Kidney disease Neg Hx   . Liver disease Neg Hx      Immunization History  Administered Date(s) Administered  . Influenza Split 12/07/2010, 11/29/2011, 11/25/2012, 11/25/2013  . Influenza Whole 12/19/2005, 11/25/2007, 12/08/2008, 12/20/2009  . Influenza, High Dose Seasonal PF 12/20/2015, 12/19/2016  . Influenza,inj,Quad PF,6+ Mos 12/20/2014  . Influenza,inj,quad, With Preservative 11/25/2016  . Influenza-Unspecified 12/09/2017  . PFIZER SARS-COV-2 Vaccination 04/10/2019, 05/05/2019  . Pneumococcal Conjugate-13 04/13/2014  . Pneumococcal Polysaccharide-23 02/25/2005  . Td 07/25/2009  . Zoster 03/08/2013    Outpatient Encounter Medications as of 10/27/2019  Medication Sig Note  . acetaminophen (TYLENOL) 325 MG tablet Take 650 mg by mouth every 6 (six) hours as needed.   [name] albuterol (VENTOLIN HFA) 108 (90 Base) MCG/ACT inhaler Inhale 2 puffs into the lungs every 6 (six) hours as needed for wheezing or shortness of breath.    [name] CALCIUM PO Take 1 tablet by mouth daily with breakfast.  09/21/2019: States currently out and planning to buy more in the near future.   [name] EPINEPHrine 0.3 mg/0.3 mL IJ SOAJ injection Inject 0.3 mg into the muscle as needed (as directed- FOR A SEVERE REACTION).  10/20/2019: Daughter states has not needed.   . ferrous sulfate 325 (65 FE) MG tablet Take 1 tablet (325 mg total) by mouth daily with breakfast. Please take with a source of Vitamin C 10/20/2019: Daughter states not sure, will verify.   . fexofenadine (ALLEGRA) 180 MG tablet Take 180 mg by mouth every other day.    . Glucosamine-Chondroit-Vit C-Mn (GLUCOSAMINE CHONDR 1500 COMPLX PO) Take 1 tablet by mouth 2 (two) times daily.  10/20/2019: Daughter states not sure will check  on.   . hydrALAZINE (APRESOLINE) 50 MG tablet Take 0.5 tablets (25 mg total) by mouth 3 (three) times daily. 10/06/2019: 1/2 tablet at night only  . latanoprost (XALATAN) 0.005 % ophthalmic solution Place 1 drop into both eyes at bedtime.    [name] losartan (COZAAR) 50 MG tablet Take 1 tablet (50 mg total) by mouth daily.   [name]  metoprolol tartrate (LOPRESSOR) 50 MG tablet Take 1 tablet (50 mg total) by mouth 3 (three) times daily.   . nitroGLYCERIN (NITROSTAT) 0.4 MG SL tablet Place 1 tablet (0.4 mg total) under the tongue every 5 (five) minutes as needed for chest pain. 10/20/2019: Daughter states has not needed.   . ondansetron (ZOFRAN) 8 MG tablet Take 1 tablet (8 mg total) by mouth every 8 (eight) hours as needed for nausea or vomiting.   [name] oxyCODONE (OXY IR/ROXICODONE) 5 MG immediate release tablet Take 1 tablet (5 mg total) by mouth every 6 (six) hours as needed for severe pain.   . pantoprazole (PROTONIX) 40 MG tablet Take 1 tablet (40 mg total) by mouth daily. 10/20/2019: Daughter states not sure, will verify.   . Psyllium (METAMUCIL FIBER PO) Take 1 Scoop by mouth daily. Mix one rounded teaspoonful into 8 oz water and drink once a day   . simethicone (MYLICON) 710 MG chewable tablet Chew 125 mg by mouth every 6 (six) hours as needed for flatulence.   . sodium chloride (OCEAN) 0.65 % SOLN nasal spray Place 1 spray into both nostrils daily as needed for congestion.    . sucralfate (CARAFATE) 1 g tablet TAKE 1 TABLET(1 GRAM) BY MOUTH TWICE DAILY   . traMADol (ULTRAM) 50 MG tablet Take 1 tablet (50 mg total) by mouth every 6 (six) hours as needed.   . vitamin B-12 (CYANOCOBALAMIN) 1000 MCG tablet Take 1,000 mcg by mouth daily.   10/20/2019: Daughter not sure will follow up.   [name] VITAMIN D PO Take 200 mcg by mouth daily with breakfast. Vitamin A  300 mcg Calcium  40 mcg 10/20/2019: Daughter not sure will follow up.    No facility-administered encounter medications on file as of 10/27/2019.      ROS: Pertinent positives and negatives noted in HPI. Remainder of ROS non-contributory    Allergies  Allergen Reactions  . Choline Fenofibrate Other (See Comments)     pt states INTOL to Trilipix w/ "thigh burning"  . Hctz [Hydrochlorothiazide]     Causes hyponatremia  . Simvastatin Other (See Comments)     pt states INTOL to STATINS \\T \ refuses to restart  . Levaquin [Levofloxacin In D5w]     Elevated BP  . Adhesive [Tape] Rash  . Bentyl [Dicyclomine Hcl] Other (See Comments)    "Made me feel weird and drained me"  . Ceclor [Cefaclor] Rash  . Clarithromycin Rash  . Codeine Nausea Only  . Doxycycline Rash  . Hydrocodone     Nightmare after taking cough syrup w/hydrocodone  . Lisinopril Cough    Developed ACE cough...  . Penicillins Itching, Rash and Other (See Comments)    At injection site Did it involve swelling of the face/tongue/throat, SOB, or low BP? No Did it involve sudden or severe rash/hives, skin peeling, or any reaction on the inside of your mouth or nose? No Did you need to seek medical attention at a hospital or doctor's office? No When did it last happen?"It was a long time ago" If all above answers are "NO", may proceed with cephalosporin use.   . Tobramycin-Dexamethasone Rash    BP 132/68 (BP Location: Right Arm, Patient Position: Sitting, Cuff Size: Normal)   Pulse 66   Temp 97.8 F (36.6 C) (Temporal)   Ht 4\' 10"  (1.473 m)   Wt 119 lb 12.8 oz (54.3 kg)   LMP 11/26/1990   SpO2 98%   BMI 25.04 kg/m   Physical Exam  Constitutional:      General: She is not in acute distress.    Appearance: She is not ill-appearing.  Cardiovascular:     Rate and Rhythm: Normal rate and regular rhythm.     Pulses: Normal pulses.  Pulmonary:     Effort: Pulmonary effort is normal. No respiratory distress.  Neurological:     General: No focal deficit present.     Mental Status: She is alert and oriented to person, place, and time.     Coordination:  Coordination normal.     Gait: Gait normal.  Psychiatric:        Mood and Affect: Mood normal.        Behavior: Behavior normal.      EXAM: DG HIP (WITH OR WITHOUT PELVIS) 1V RIGHT  COMPARISON:  CT abdomen pelvis-08/24/2019; chest radiograph-08/25/2019  FINDINGS: No fracture or dislocation. Moderate degenerative change of the right hip with joint space loss, subchondral sclerosis and osteophytosis. No evidence of avascular necrosis.  Limited visualization of the pelvis is normal. Similar moderate degenerative change of the contralateral left hip is suspected though incompletely evaluated. Degenerative change of the lower lumbar spine is suspected though incompletely evaluated.  Surgical clips overlie the medial aspect of the right thigh likely the sequela of previous venous harvesting in the setting of known CABG. Regional soft tissues appear otherwise normal.  IMPRESSION: 1. No acute findings. 2. Moderate degenerative change of the right hip.   EXAM: LUMBAR SPINE - COMPLETE 4+ VIEW  FINDINGS: There are 5 non rib-bearing lumbar type vertebral bodies  Mild scoliotic curvature of the thoracolumbar spine with dominant caudal component convex to the right measuring approximately 6 degrees (as measured from the superior endplate of L2 to the inferior endplate of L4).  Grade 1 anterolisthesis of L4 upon L5 measuring approximately 7 mm without associated pars defects. No retrolisthesis.  Mild (approximately 25%) compression deformity involving the L5 vertebral body appears similar to abdominal CT performed 08/24/2019. Remaining lumbar vertebral body heights appear preserved.  Mild to moderate multilevel lumbar spine DDD, worse at L1-L2, L4-L5 and L5-S1 with disc space height loss, endplate irregularity and sclerosis.  Limited visualization of the bilateral SI joints, sacrum and coccyx is normal.  A biliary stent overlies expected location of the CBD with  expected pneumobilia. Nonobstructive bowel gas pattern.  IMPRESSION: 1. No definite acute findings. 2. Mild-to-moderate multilevel lumbar spine DDD worse at L1-L2, L4-L5 and L5-S1.    A/P:  1. Coccyodynia 2. Cause of injury, accidental fall, sequela - xrays negative at [facility] - cont with heating pad, muscle rub, pain meds as previously scheduled  - f/u if symptoms worsen or do not continue to slowly improve  Pt asks me to call her daughter [phone] (cell) to discuss OV from today   This visit occurred during the SARS-CoV-2 public health emergency.  Safety protocols were in place, including screening questions prior to the visit, additional usage of staff PPE, and extensive cleaning of exam room while observing appropriate contact time as indicated for disinfecting solutions.

## 2019-10-28 MED FILL — traMADol HCL 50 MG TABS: 50 | 7 days supply | Qty: 30 | Fill #1

## 2019-10-29 ENCOUNTER — Other Ambulatory Visit: Payer: Self-pay | Admitting: Hematology and Oncology

## 2019-10-29 MED FILL — oxyCODONE HCL 5 MG TABS: 5 | 7 days supply | Qty: 28 | Fill #0

## 2019-11-03 ENCOUNTER — Other Ambulatory Visit: Payer: Medicare Other | Admitting: *Deleted

## 2019-11-03 ENCOUNTER — Other Ambulatory Visit: Payer: Self-pay

## 2019-11-03 DIAGNOSIS — Z515 Encounter for palliative care: Secondary | ICD-10-CM

## 2019-11-04 ENCOUNTER — Other Ambulatory Visit: Payer: Self-pay | Admitting: *Deleted

## 2019-11-04 ENCOUNTER — Ambulatory Visit: Payer: Medicare Other | Admitting: *Deleted

## 2019-11-04 NOTE — Patient Outreach (Addendum)
[facility]) Care Management  11/04/2019  [name] 28-Apr-1935 [id number]   Received [facility] Pharmacist case status update via [place] [facility]/ Epic in-basket message.  No patient outreach needed at this time.   RNCM will follow up with patient's daughter / designated party release [name])for telephone outreach attempt, within30business days, other diagnosis care coordinationfollow up,and proceed with case closure, after 4th unsuccessful outreach call.   [name] H. Annia Friendly, BSN, [place] [facility] Telephonic CM Phone: [phone] Fax: [phone]

## 2019-11-08 ENCOUNTER — Ambulatory Visit (INDEPENDENT_AMBULATORY_CARE_PROVIDER_SITE_OTHER): Payer: Medicare Other | Admitting: Gastroenterology

## 2019-11-08 ENCOUNTER — Encounter: Payer: Self-pay | Admitting: Gastroenterology

## 2019-11-08 VITALS — BP 130/58 | HR 64 | Ht <= 58 in | Wt 119.5 lb

## 2019-11-08 DIAGNOSIS — K831 Obstruction of bile duct: Secondary | ICD-10-CM

## 2019-11-08 DIAGNOSIS — Z9889 Other specified postprocedural states: Secondary | ICD-10-CM

## 2019-11-08 DIAGNOSIS — C259 Malignant neoplasm of pancreas, unspecified: Secondary | ICD-10-CM | POA: Diagnosis not present

## 2019-11-08 MED FILL — PANTOPRAZOLE SOD DR 40 MG T: 40 | 30 days supply | Qty: 30 | Fill #1

## 2019-11-08 NOTE — Patient Instructions (Signed)
If you are age 82 or older, your body mass index should be between 23-30. Your Body mass index is 25.41 kg/m. If this is out of the aforementioned range listed, please consider follow up with your Primary Care Provider.  If you are age 81 or younger, your body mass index should be between 19-25. Your Body mass index is 25.41 kg/m. If this is out of the aformentioned range listed, please consider follow up with your Primary Care Provider.   Stop Metamucil when you are having loose stools  Follow up in three months   Thank you for choosing me and [facility].  Pricilla Riffle. Dagoberto Ligas., MD., [facility]

## 2019-11-08 NOTE — Progress Notes (Signed)
    History of Present Illness: This is an 84 year old female with stage IA pancreatic adenocarcinoma and a biliary stent.  She is accompanied by her daughter.  She has biliary obstruction from her pancreatic cancer and had 2 episodes of cholangitis from obstruction.  A plastic biliary stent was placed in October 2020.  An uncovered metal stent was placed in November 2020.  A fully covered metal stent was placed inside the uncovered metal stent in June.  She is followed by Dr. Lorenso Courier.  LFTs on August 11 were normal.  CBC on August 11 showed a persistent mild anemia, hemoglobin 10.5 and iron saturation was 15%.  She notes ongoing fatigue, intermittent abdominal pain, mid back pain, occasional diarrhea.  No recent antibiotics.  Current Medications, Allergies, Past Medical History, Past Surgical History, Family History and Social History were reviewed in [employer] record.   Physical Exam: General: Well developed, well nourished, no acute distress Head: Normocephalic and atraumatic Eyes:  sclerae anicteric, EOMI Ears: Normal auditory acuity Mouth: Not examined, mask on during Covid-19 pandemic Lungs: Clear throughout to auscultation Heart: Regular rate and rhythm; no murmurs, rubs or bruits Abdomen: Soft, non tender and non distended. No masses, hepatosplenomegaly or hernias noted. Normal Bowel sounds Rectal: Not done Musculoskeletal: Symmetrical with no gross deformities  Pulses:  Normal pulses noted Extremities: No clubbing, cyanosis, edema or deformities noted Neurological: Alert oriented x 4, grossly nonfocal Psychological:  Alert and cooperative. Normal mood and affect   Assessment and Recommendations:  1.  Stage IA pancreatic adenocarcinoma with biliary obstruction.  Stent appears to be patent and functioning.  No clinical signs of obstruction at this time.  Intermittent abdominal pain, mid back pain and diarrhea likely related to pancreatic cancer.  Discontinue  Metamucil and resume if constipation occurs.  Encouraged to use IBgard 1-2 taken 3 times daily as needed.  Continue pantoprazole 40 mg daily.  Adjust diet to avoid any foods, beverages that exacerbate diarrhea.  Imodium 1-2 twice daily as needed.  Ongoing pain management with tramadol and oxycodone per her oncologist.  Advised on signs and symptoms of biliary obstruction and to contact us promptly for occur. REV in 6 months.  2. IDA.  Continue ferrous sulfate 325 mg daily.

## 2019-11-10 ENCOUNTER — Other Ambulatory Visit: Payer: Self-pay | Admitting: Hematology and Oncology

## 2019-11-10 MED FILL — traMADol HCL 50 MG TABS: 50 | 7 days supply | Qty: 30 | Fill #0

## 2019-11-11 ENCOUNTER — Telehealth: Payer: Self-pay | Admitting: *Deleted

## 2019-11-11 NOTE — Telephone Encounter (Signed)
Received call from pt's daughter, [name]. She is calling because her mother's abdominal pain has increased some and she has not been able to sleep well at night, for the last 2 nights. She is currently taking oxycodone 5 mg @ around 9pm and has not been able to fall asleep or sleep well due pain. She takes Tramadol during the day.  Discussed with Dr. Lorenso Courier.  He recommended going to 2 tablets of oxycodone @ bedtime and see how this works.  We are seeing [name] next week and can re-evaluate her pain @ that time.  TCT [employer] and advised of the above. Vm message left with the above information.  Advised that she can call back if any further questions or concerns.

## 2019-11-16 ENCOUNTER — Other Ambulatory Visit: Payer: Self-pay | Admitting: Hematology and Oncology

## 2019-11-16 ENCOUNTER — Telehealth: Payer: Self-pay

## 2019-11-16 DIAGNOSIS — C259 Malignant neoplasm of pancreas, unspecified: Secondary | ICD-10-CM

## 2019-11-16 NOTE — Telephone Encounter (Signed)
Sent a in basket to Dr Lorenso Courier.  Patient's daughter called stating that patient is having light headiness and is holding on to the wall while walking. She states this has been going on since she increased her pain medication. Patient is taking Tramadol Q3hrs and two Oxycodone before bed. She checked her BP while she was light headed and it was 149/70. Her daughter was going over to check on her today and she was going to try to hold out on some of her pain medication to see if her light headiness improves. States patient has an appointment to more but wanted to notify you today.  Advised to reach out to the office with any further concerns.

## 2019-11-16 NOTE — Progress Notes (Signed)
[place] [phone]   [phone]  PROGRESS NOTE  Patient Care Team: Ronnald Nian, DO as PCP - General (Family Medicine) Josue Hector, MD as PCP - Cardiology (Cardiology) Neldon Mc, MD as Surgeon (General Surgery) Gery Pray, MD (Radiation Oncology) Magrinat, Virgie Dad, MD (Hematology and Oncology) Cameron Sprang, MD as Consulting Physician (Neurology) Placke, Dawn, RN (Inactive) as Registered Nurse Placke, Dawn, RN (Inactive) as Oncology Nurse Navigator Serena Colonel, RN as [place] Management  Hematological/Oncological History  # Pancreatic Adenocarcinoma Stage IA (T1N0M0). Locally Advanced, Unresectable 1) 11/26/2018: patient had visit with PCP for pruritis, found to have elevated LFTs 2) 11/27/2018: patient presented to the ED. Underwent CT scan which revealed 1.9 x 1.6 x 1.6 cm cm mass in the head of the pancreas with biliary duct dilation 3) 11/28/2018: ERCP performed to place stent for biliary obstruction  4) 12/10/2018: EUS w/ FNA reveals adenocarcinoma of the pancreas 5) 12/18/2018: establish care with Dr. Lorenso Courier  6) 12/23/2018: surgical visit with Dr. Barry Dienes 7) 12/30/2018: radiation oncology visit with Dr. Sondra Come. Agreeable to SBRT to pancreatic mass.  8) 01/2019: Radiation delayed due to infection with COVID-19.  9) 03/08/2019-03/18/2019: Palliative radiation therapy to pancreatic mass 10) 05/06/2019: CT scan abdomen shows modest increase in tumor size with no clear evidence of metastatic spread.  11) 08/25/2019: ERCP performed while admitted due to acute drop in Hgb and marked increase in LFTs.   #Breast Cancer T2 N0, stage IIA invasive ductal carcinoma, grade 3,  ER+/PR+/HER-2 not amplified 1) December 2012:  leftlumpectomy and sentinel lymph node biopsy  2) March 2013: completed radiation therapy, started on letrozole 3) Feb 2018: completed letrozole therapy   HISTORY OF PRESENTING  ILLNESS:  [name] 84 y.o. female with medical history significant for CAD s/p CABG in 2011, OSA on CPAP, Breast Cancer, and GERD who presents for a follow up for adenocarcinoma of the pancreas. She was last seen in our clinic on 10/06/2019. In the interim she has had an ED visit on 10/23/2019 for a fall from standing. She was discharged after plain films were negative.   On exam today [name] unfortunately notes that her abdominal pain has been progressively worsening.  She notes that she is having that continued gnawing abdominal pain, but that is worsening in character.  She was taking tramadol 50 mg every 3 hours in order to help try to control the pain but was not having much in the way of progress.  She also notes that her energy is rapidly declining and then when she tries to eat she is rapidly feeling up.  She notes that she has been eating about 3 meals a day, but is also been "grazing" and eating a little bit of food periodically.  She notes that her bowels are still moving well and she has not noticed any overt signs of bleeding.  Overall she denies having any fevers, chills, sweats, nausea, vomiting, or diarrhea.  Her weight has also been stable at 119 pounds.  A full 10 point ROS is listed below.  MEDICAL HISTORY:  Past Medical History:  Diagnosis Date  . Asthma   . Atrophic vaginitis   . Breast cancer, Left 12/20/2010   NO BLOOD PRESSURE CHECKS OR STICKS IN LEFT ARM  . CAD (coronary artery disease)    a. 01/19/2010 s/p CABG x 3, lima->lad, vg->diag, vg->om1;  b. 06/2011 :Lexi MV: EF 84%, No ischemia. c. 01/06/14 s/p negative nuclear  stress test with EF >70%  . Cervical arthritis   . Colonic polyp 04-27-2009   tubular adenoma  . Diverticulosis of colon (without mention of hemorrhage)   . Fibromyalgia   . Gallstones   . GERD (gastroesophageal reflux disease)   . Glaucoma   . Headache(784.0)    a. frequently assocaited with high BPs  . Hiatal hernia   .  Hypercholesterolemia   . Irritable bowel syndrome   . Labile hypertension   . Lymphedema    left arm  . Pancreatic cancer ([place]) dx'd 11/2018  . Pinched vertebral nerve   . Secondary diabetes ([place]) 12/03/2018  . Sleep apnea     SURGICAL HISTORY: Past Surgical History:  Procedure Laterality Date  . [place] STUDY N/A 01/08/2016   Procedure: [place] STUDY;  Surgeon: Ladene Artist, MD;  Location: [facility] ENDOSCOPY;  Service: Endoscopy;  Laterality: N/A;  . BILIARY BRUSHING  11/28/2018   Procedure: BILIARY BRUSHING;  Surgeon: Gatha Mayer, MD;  Location: [facility] ENDOSCOPY;  Service: Endoscopy;;  . BILIARY DILATION  01/06/2019   Procedure: BILIARY DILATION;  Surgeon: Irving Copas., MD;  Location: [facility] ENDOSCOPY;  Service: Endoscopy;;  . BILIARY STENT PLACEMENT  11/28/2018   Procedure: BILIARY STENT PLACEMENT;  Surgeon: Gatha Mayer, MD;  Location: [facility] ENDOSCOPY;  Service: Endoscopy;;  . BILIARY STENT PLACEMENT N/A 01/06/2019   Procedure: BILIARY STENT PLACEMENT;  Surgeon: Irving Copas., MD;  Location: [facility] ENDOSCOPY;  Service: Endoscopy;  Laterality: N/A;  . BILIARY STENT PLACEMENT N/A 08/25/2019   Procedure: BILIARY STENT PLACEMENT;  Surgeon: Jackquline Denmark, MD;  Location: [facility] ENDOSCOPY;  Service: Endoscopy;  Laterality: N/A;  . BREAST LUMPECTOMY Left 02/06/11  . CATARACT EXTRACTION, BILATERAL     bilateral caaract removal,  . COLONOSCOPY    . CORONARY ANGIOPLASTY WITH STENT PLACEMENT     Stent 2007  . CORONARY ARTERY BYPASS GRAFT    . ENDOSCOPIC RETROGRADE CHOLANGIOPANCREATOGRAPHY (ERCP) WITH PROPOFOL N/A 01/06/2019   Procedure: ENDOSCOPIC RETROGRADE CHOLANGIOPANCREATOGRAPHY (ERCP) WITH PROPOFOL;  Surgeon: Rush Landmark Telford Nab., MD;  Location: [facility] ENDOSCOPY;  Service: Endoscopy;  Laterality: N/A;  [id number]  . ERCP N/A 11/28/2018   Procedure: ENDOSCOPIC RETROGRADE CHOLANGIOPANCREATOGRAPHY (ERCP);  Surgeon: Gatha Mayer, MD;  Location: [facility] ENDOSCOPY;  Service: Endoscopy;   Laterality: N/A;  . ERCP N/A 08/25/2019   Procedure: ERCP with stent  ;  Surgeon: Jackquline Denmark, MD;  Location: [facility] ENDOSCOPY;  Service: Endoscopy;  Laterality: N/A;  . ESOPHAGEAL MANOMETRY N/A 01/08/2016   Procedure: ESOPHAGEAL MANOMETRY (EM);  Surgeon: Ladene Artist, MD;  Location: [facility] ENDOSCOPY;  Service: Endoscopy;  Laterality: N/A;  . ESOPHAGOGASTRODUODENOSCOPY    . ESOPHAGOGASTRODUODENOSCOPY N/A 01/06/2019   Procedure: ESOPHAGOGASTRODUODENOSCOPY (EGD);  Surgeon: Rush Landmark Telford Nab., MD;  Location: [facility] ENDOSCOPY;  Service: Endoscopy;  Laterality: N/A;  . ESOPHAGOGASTRODUODENOSCOPY N/A 08/25/2019   Procedure: ESOPHAGOGASTRODUODENOSCOPY (EGD);  Surgeon: Jackquline Denmark, MD;  Location: [facility] ENDOSCOPY;  Service: Endoscopy;  Laterality: N/A;  . ESOPHAGOGASTRODUODENOSCOPY (EGD) WITH PROPOFOL N/A 12/10/2018   Procedure: ESOPHAGOGASTRODUODENOSCOPY (EGD) WITH PROPOFOL;  Surgeon: [name], MD;  Location: [facility] ENDOSCOPY;  Service: Endoscopy;  Laterality: N/A;  . EUS N/A 12/10/2018   Procedure: UPPER ENDOSCOPIC ULTRASOUND (EUS) RADIAL;  Surgeon: [name], MD;  Location: [facility] ENDOSCOPY;  Service: Endoscopy;  Laterality: N/A;  . EUS N/A 01/06/2019   Procedure: FULL UPPER ENDOSCOPIC ULTRASOUND (EUS) RADIAL;  Surgeon: Rush Landmark Telford Nab., MD;  Location: [facility] ENDOSCOPY;  Service: Endoscopy;  Laterality: N/A;  .  FIDUCIAL MARKER PLACEMENT  01/06/2019   Procedure: FIDUCIAL MARKER PLACEMENT;  Surgeon: Irving Copas., MD;  Location: [facility] ENDOSCOPY;  Service: Endoscopy;;  . FINE NEEDLE ASPIRATION N/A 12/10/2018   Procedure: FINE NEEDLE ASPIRATION (FNA) LINEAR;  Surgeon: [name], MD;  Location: [facility] ENDOSCOPY;  Service: Endoscopy;  Laterality: N/A;  . REMOVAL OF STONES  08/25/2019   Procedure: REMOVAL OF STONES;  Surgeon: Jackquline Denmark, MD;  Location: [facility] ENDOSCOPY;  Service: Endoscopy;;  . [name]  01/06/2019   Procedure: [name];  Surgeon: Mansouraty, Telford Nab., MD;   Location: [facility] ENDOSCOPY;  Service: Endoscopy;;  . STENT REMOVAL  01/06/2019   Procedure: STENT REMOVAL;  Surgeon: Irving Copas., MD;  Location: [facility] ENDOSCOPY;  Service: Endoscopy;;    ALLERGIES:  is allergic to choline fenofibrate, hctz [hydrochlorothiazide], simvastatin, levaquin [levofloxacin in d5w], adhesive [tape], bentyl [dicyclomine hcl], ceclor [cefaclor], clarithromycin, codeine, doxycycline, hydrocodone, lisinopril, penicillins, and tobramycin-dexamethasone.  MEDICATIONS:  Current Outpatient Medications  Medication Sig Dispense Refill  . acetaminophen (TYLENOL) 325 MG tablet Take 650 mg by mouth every 6 (six) hours as needed.    [name] albuterol (VENTOLIN HFA) 108 (90 Base) MCG/ACT inhaler Inhale 2 puffs into the lungs every 6 (six) hours as needed for wheezing or shortness of breath.     . EPINEPHrine 0.3 mg/0.3 mL IJ SOAJ injection Inject 0.3 mg into the muscle as needed (as directed- FOR A SEVERE REACTION).  (Patient not taking: Reported on 11/08/2019)    . ferrous sulfate 325 (65 FE) MG tablet Take 1 tablet (325 mg total) by mouth daily with breakfast. Please take with a source of Vitamin C 90 tablet 3  . fexofenadine (ALLEGRA) 180 MG tablet Take 180 mg by mouth every other day.     . Glucosamine-Chondroit-Vit C-Mn (GLUCOSAMINE CHONDR 1500 COMPLX PO) Take 1 tablet by mouth 2 (two) times daily.     . hydrALAZINE (APRESOLINE) 50 MG tablet Take 0.5 tablets (25 mg total) by mouth 3 (three) times daily. 135 tablet 1  . latanoprost (XALATAN) 0.005 % ophthalmic solution Place 1 drop into both eyes at bedtime.     [name] losartan (COZAAR) 50 MG tablet Take 1 tablet (50 mg total) by mouth daily. 90 tablet 3  . metoprolol tartrate (LOPRESSOR) 50 MG tablet Take 1 tablet (50 mg total) by mouth 3 (three) times daily. 270 tablet 3  . nitroGLYCERIN (NITROSTAT) 0.4 MG SL tablet Place 1 tablet (0.4 mg total) under the tongue every 5 (five) minutes as needed for chest pain. (Patient not taking: Reported  on 11/08/2019) 25 tablet 2  . ondansetron (ZOFRAN) 8 MG tablet Take 1 tablet (8 mg total) by mouth every 8 (eight) hours as needed for nausea or vomiting. 30 tablet 1  . oxyCODONE (OXY IR/ROXICODONE) 5 MG immediate release tablet Take 1 tablet (5 mg total) by mouth every 6 (six) hours as needed for severe pain. 60 tablet 0  . pantoprazole (PROTONIX) 40 MG tablet Take 1 tablet (40 mg total) by mouth daily. 30 tablet 5  . Peppermint Oil (IBGARD PO) Take by mouth. 1-2 two times a day    . Psyllium (METAMUCIL FIBER PO) Take 1 Scoop by mouth daily. Mix one rounded teaspoonful into 8 oz water and drink once a day    . simethicone (MYLICON) 937 MG chewable tablet Chew 125 mg by mouth every 6 (six) hours as needed for flatulence.    . sodium chloride (OCEAN) 0.65 % SOLN nasal spray Place 1 spray into both  nostrils daily as needed for congestion.     . sucralfate (CARAFATE) 1 g tablet TAKE 1 TABLET(1 GRAM) BY MOUTH TWICE DAILY 180 tablet 0  . traMADol (ULTRAM) 50 MG tablet Take 1-2 tablets (50-100 mg total) by mouth every 6 (six) hours as needed. 60 tablet 0  . vitamin B-12 (CYANOCOBALAMIN) 1000 MCG tablet Take 1,000 mcg by mouth daily.      [name] VITAMIN D PO Take 200 mcg by mouth daily with breakfast. Vitamin A  300 mcg Calcium  40 mcg     No current facility-administered medications for this visit.    REVIEW OF SYSTEMS:   Constitutional: ( - ) fevers, ( - )  chills , ( - ) night sweats Eyes: ( - ) blurriness of vision, ( - ) double vision, ( - ) watery eyes Ears, nose, mouth, throat, and face: ( - ) mucositis, ( - ) sore throat Respiratory: ( - ) cough, ( - ) dyspnea, ( - ) wheezes Cardiovascular: ( - ) palpitation, ( - ) chest discomfort, ( - ) lower extremity swelling Gastrointestinal:  ( - ) nausea, ( - ) heartburn, ( - ) change in bowel habits Skin: ( - ) abnormal skin rashes Lymphatics: ( - ) new lymphadenopathy, ( - ) easy bruising Neurological: ( - ) numbness, ( - ) tingling, ( - ) new  weaknesses Behavioral/Psych: ( - ) mood change, ( - ) new changes  All other systems were reviewed with the patient and are negative.  PHYSICAL EXAMINATION: ECOG PERFORMANCE STATUS: 2 - Symptomatic, <50% confined to bed  Vitals:   11/17/19 1115  BP: (!) 171/75  Pulse: 61  Resp: 18  Temp: 97.7 F (36.5 C)  SpO2: 96%   Filed Weights   11/17/19 1115  Weight: 119 lb 12.8 oz (54.3 kg)    GENERAL: pleasant, well appearing elderly Caucasian female in NAD  SKIN: skin color, texture, turgor are normal, no rashes or significant lesions EYES: conjunctiva are pink and non-injected. No jaundice evident in the sclera.  LUNGS: clear to auscultation and percussion with normal breathing effort HEART: regular rate & rhythm and no murmurs and no lower extremity edema ABDOMEN: soft and non-distended. Palpable epigastric abdominal mass.  Musculoskeletal: no cyanosis of digits and no clubbing  PSYCH: alert & oriented x 3, fluent speech NEURO: no focal motor/sensory deficits  LABORATORY DATA:  I have reviewed the data as listed CMP Latest Ref Rng & Units 11/17/2019 10/06/2019 09/01/2019  Glucose 70 - 99 mg/dL 148(H) 155(H) 183(H)  BUN 8 - 23 mg/dL 7(L) 9 9  Creatinine 0.44 - 1.00 mg/dL 0.73 0.74 0.76  Sodium 135 - 145 mmol/L 133(L) 133(L) 135  Potassium 3.5 - 5.1 mmol/L 4.4 4.2 3.9  Chloride 98 - 111 mmol/L 94(L) 97(L) 98  CO2 22 - 32 mmol/L $RemoveB'31 27 28  'GlMtVsUg$ Calcium 8.9 - 10.3 mg/dL 9.6 9.6 9.3  Total Protein 6.5 - 8.1 g/dL 7.3 7.0 7.2  Total Bilirubin 0.3 - 1.2 mg/dL 0.9 0.8 0.6  Alkaline Phos 38 - 126 U/L 155(H) 124 136(H)  AST 15 - 41 U/L $Remo'27 22 21  'GnhUE$ ALT 0 - 44 U/L 30 21 37   CBC Latest Ref Rng & Units 11/17/2019 10/06/2019 09/01/2019  WBC 4.0 - 10.5 K/uL 5.9 4.8 5.2  Hemoglobin 12.0 - 15.0 g/dL 10.8(L) 10.5(L) 11.3(L)  Hematocrit 36 - 46 % 32.9(L) 31.3(L) 34.2(L)  Platelets 150 - 400 K/uL 296 258 294    RADIOGRAPHIC STUDIES:  CLINICAL  DATA:  Epigastric abdominal pain. Blood in stool. Pancreatic  adenocarcinoma. Remote history of breast cancer.  EXAM: CT ABDOMEN AND PELVIS WITH CONTRAST  TECHNIQUE: Multidetector CT imaging of the abdomen and pelvis was performed using the standard protocol following bolus administration of intravenous contrast.  CONTRAST:  189mL OMNIPAQUE IOHEXOL 300 MG/ML  SOLN  COMPARISON:  08/03/2019  FINDINGS: Lower chest: Stable scarring or subsegmental atelectasis in the lung bases. Centrilobular emphysema. Old granulomatous disease. Descending thoracic aortic atherosclerotic calcification.  Hepatobiliary: Pneumobilia. Biliary stent remains in place. No compelling findings of metastatic disease to the liver. No overt gallbladder wall thickening.  Pancreas: Prominently dilated dorsal pancreatic duct similar to prior, extending to an infiltrative mass in the head of the pancreas which surrounds the stent. Several fiducials remain in place. Overall the pancreatic mass does not appear significantly changed, measuring roughly 3.1 by 2.8 cm on image 26/2. Mild surrounding inflammatory stranding along the pancreatic head.  Spleen: Unchanged with 3 small hypodense lesions which are probably benign but technically nonspecific.  Adrenals/Urinary Tract: Unremarkable  Stomach/Bowel: Indistinctness of tissue planes around the pancreatic head and adjacent portions of the duodenum, pancreatitis and duodenitis not exclude and some of this may be related to prior radiation therapy. Overall the degree of stranding around the descending and transverse duodenum is similar to 08/03/2019 and no dilation of the stomach or bowel is observed.  Sigmoid colon diverticulosis is present. No active diverticulitis identified.  Vascular/Lymphatic: The pancreatic head mass narrows the confluence of the splenic vein and the SMV into the portal vein. Aortoiliac atherosclerotic vascular disease. Stable prominence of parametrial venous structures, left greater  than right, pelvic congestion syndrome not excluded.  Reproductive: Unremarkable  Other: Low-grade stranding in the central mesentery, minimally increased from prior, nonspecific.  Musculoskeletal: Degenerative arthropathy of both hips. Grade 1 degenerative anterolisthesis at L4-5. Multilevel lumbar spondylosis and degenerative disc disease with left greater than right foraminal impingement at L5-S1. central narrowing of the thecal sac at the L4-5 level.  IMPRESSION: 1. Similar appearance of the infiltrative mass in the head of the pancreas with surrounding fiducials. The mass narrows the confluence of the splenic vein and the SMV into the portal vein. 2. Indistinctness of tissue planes around the pancreatic head and adjacent portions of the duodenum, pancreatitis and duodenitis could cause this appearance, and radiation therapy in the past may be predisposing. 3. Other imaging findings of potential clinical significance: Sigmoid colon diverticulosis. Stable prominence of parametrial venous structures, left greater than right, pelvic congestion syndrome not excluded. Multilevel lumbar spondylosis and degenerative disc disease causing left greater than right foraminal impingement at L5-S1. 4. Emphysema and aortic atherosclerosis.  Aortic Atherosclerosis (ICD10-I70.0) and Emphysema (ICD10-J43.9).   Electronically Signed   By: Van Clines M.D.   On: 08/24/2019 14:58  ASSESSMENT & PLAN YENI JIGGETTS 84 y.o. female with medical history significant for CAD s/p CABG in 2011, OSA on CPAP, Breast Cancer, and GERD who presents for a follow up for localized adenocarcinoma of the pancreas.  On exam today [name] is having worsening abdominal pain, but is otherwise quite stable in terms of weight, appetite, and labs.  I would encourage her to follow the pain regimen as recommended by nurse [facility] which included 100 mg tramadol every 8 hours staggered with 1000 mg  Tylenol every 8 hours and the addition of 10 mg oxycodone at night or substituted for the tramadol in the event that her pain is severe.  We will continue with symptom management  and close monitoring of her labs today give her a better idea of life expectancy/progression of disease. We will see her back in 6 weeks time for continued monitoring.  #Localized Adenocarcinoma of the Pancreas, Stage IA (T1N0M0) --Completed palliatve radiation therapy on 03/18/2019  --after reviewing patients functional status and ECOG 2 I think the patient is possible a candidate for chemotherapy (likely only monotherapy gemcitabine (J Clin Oncol. 1997 Jun; 15(6):2403-13.)). This could be considered, though I do not favor that approach. The patient and her daughter are OK with continued comfort based care. --last CT scan on 08/24/2019 showed increase in size of tumor (3.1 by 2.8 cm).  --for now continued supportive care and symptom management.  --RTC in 6 weeks to assess symptoms.   #Breast Cancer T2 N0, stage IIA invasive ductal carcinoma, grade 3,  ER+/PR+/HER-2 not amplified --completed 84 year old letrozole therapy in Feb 2018 --previously followed by Dr. Gunnar Bulla Magrinat at [facility] --in remission   #Cancer Related Pain, worsening --worsening pain today, still across the top of the abdomen. --recommend taking 1000 mg tylenol PRN starggered tramadol 100mg  PO q8H PRN.  --patient can take oxycodone 5-10mg  PO q6H PRN for severe pain. She has been taking 10mg  PO oxycodone at night.  --LFTs have normalized, OK to take tylenol at above dosage --encourage stool softener/miralx if BM becomes less regular.  --will continue to monitor abdominal pain  #Symptom Management --notes light colored stools that float, but no diarrhea. Possible initial signs of pancreatic insufficiency. Continue to monitor and consider pancreatic enzymes vs low fat diet.  --no itching or jaundice today. Continue to monitor. If worsening, could be sign  of stent obstruction.  --mild nausea, at patient's baseline. Treated with ginger. No diarrhea. Will provide zofran 8mg  PO PRN for nausea.   #Goals Of Care --patient notes she wants to maintain a high quality of life and does not think that surgery/chemotherapy would allow that. I agree with her assessment --referred to [facility] care for introductions, assistance with symptoms management, and preliminary hospice discussions.   All questions were answered. The patient knows to call the clinic with any problems, questions or concerns.  A total of more than 30 minutes were spent face-to-face with the patient during this encounter and over half of that time was spent on counseling and coordination of care as outlined above.   Ledell Peoples, MD Department of Hematology/Oncology [place] [facility] Phone: [phone] Pager: [phone] Email: [email or web address]   11/21/2019 12:36 PM   Literature Support:  Nyra Capes, Switchenko Hewitt Shorts RJ, [place], [place] T, et al. Outcomes for patients with locally advanced pancreatic adenocarcinoma treated with stereotactic body radiation therapy versus conventionally fractionated radiation. Cancer. 2017;123(18):3486-93.  --Among 8450 patients, 7819 (92.5%) were treated with CFRT, and 631 (7.5%) underwent SBRT. Receipt of SBRT was associated with superior OS in the multivariate analysis (hazard ratio, 0.84; 95% confidence interval, 0.75?0.93; P?<?.001). With propensity score matching, 988 patients in all were matched, with 494 patients in each cohort. Within the propensity?matched cohorts, the median OS (13.9 vs 11.6 months) and the 2?year OS rate (21.7% vs 16.5%) were significantly higher with SBRT versus CFRT (P?=?.0014).

## 2019-11-17 ENCOUNTER — Other Ambulatory Visit: Payer: Self-pay

## 2019-11-17 ENCOUNTER — Inpatient Hospital Stay: Payer: Medicare Other | Attending: Hematology and Oncology | Admitting: Hematology and Oncology

## 2019-11-17 ENCOUNTER — Inpatient Hospital Stay: Payer: Medicare Other

## 2019-11-17 VITALS — BP 171/75 | HR 61 | Temp 97.7°F | Resp 18 | Ht <= 58 in | Wt 119.8 lb

## 2019-11-17 DIAGNOSIS — Z888 Allergy status to other drugs, medicaments and biological substances status: Secondary | ICD-10-CM | POA: Insufficient documentation

## 2019-11-17 DIAGNOSIS — Z8719 Personal history of other diseases of the digestive system: Secondary | ICD-10-CM | POA: Insufficient documentation

## 2019-11-17 DIAGNOSIS — I10 Essential (primary) hypertension: Secondary | ICD-10-CM | POA: Insufficient documentation

## 2019-11-17 DIAGNOSIS — Z853 Personal history of malignant neoplasm of breast: Secondary | ICD-10-CM | POA: Diagnosis not present

## 2019-11-17 DIAGNOSIS — M47816 Spondylosis without myelopathy or radiculopathy, lumbar region: Secondary | ICD-10-CM | POA: Diagnosis not present

## 2019-11-17 DIAGNOSIS — J439 Emphysema, unspecified: Secondary | ICD-10-CM | POA: Insufficient documentation

## 2019-11-17 DIAGNOSIS — G4733 Obstructive sleep apnea (adult) (pediatric): Secondary | ICD-10-CM | POA: Insufficient documentation

## 2019-11-17 DIAGNOSIS — I700 Atherosclerosis of aorta: Secondary | ICD-10-CM | POA: Diagnosis not present

## 2019-11-17 DIAGNOSIS — Z880 Allergy status to penicillin: Secondary | ICD-10-CM | POA: Diagnosis not present

## 2019-11-17 DIAGNOSIS — C259 Malignant neoplasm of pancreas, unspecified: Secondary | ICD-10-CM

## 2019-11-17 DIAGNOSIS — Z881 Allergy status to other antibiotic agents status: Secondary | ICD-10-CM | POA: Diagnosis not present

## 2019-11-17 DIAGNOSIS — C250 Malignant neoplasm of head of pancreas: Secondary | ICD-10-CM | POA: Diagnosis not present

## 2019-11-17 DIAGNOSIS — K219 Gastro-esophageal reflux disease without esophagitis: Secondary | ICD-10-CM | POA: Diagnosis not present

## 2019-11-17 DIAGNOSIS — Z885 Allergy status to narcotic agent status: Secondary | ICD-10-CM | POA: Insufficient documentation

## 2019-11-17 DIAGNOSIS — E139 Other specified diabetes mellitus without complications: Secondary | ICD-10-CM | POA: Insufficient documentation

## 2019-11-17 DIAGNOSIS — G893 Neoplasm related pain (acute) (chronic): Secondary | ICD-10-CM | POA: Insufficient documentation

## 2019-11-17 DIAGNOSIS — M5137 Other intervertebral disc degeneration, lumbosacral region: Secondary | ICD-10-CM | POA: Diagnosis not present

## 2019-11-17 DIAGNOSIS — Z79899 Other long term (current) drug therapy: Secondary | ICD-10-CM | POA: Insufficient documentation

## 2019-11-17 DIAGNOSIS — Z923 Personal history of irradiation: Secondary | ICD-10-CM | POA: Diagnosis not present

## 2019-11-17 DIAGNOSIS — I2510 Atherosclerotic heart disease of native coronary artery without angina pectoris: Secondary | ICD-10-CM | POA: Diagnosis not present

## 2019-11-17 LAB — CMP (CANCER CENTER ONLY)
ALT: 30 U/L (ref 0–44)
AST: 27 U/L (ref 15–41)
Albumin: 3.5 g/dL (ref 3.5–5.0)
Alkaline Phosphatase: 155 U/L — ABNORMAL HIGH (ref 38–126)
Anion gap: 8 (ref 5–15)
BUN: 7 mg/dL — ABNORMAL LOW (ref 8–23)
CO2: 31 mmol/L (ref 22–32)
Calcium: 9.6 mg/dL (ref 8.9–10.3)
Chloride: 94 mmol/L — ABNORMAL LOW (ref 98–111)
Creatinine: 0.73 mg/dL (ref 0.44–1.00)
GFR, Est AFR Am: >60 mL/min (ref >60)
GFR, Estimated: >60 mL/min (ref >60)
Glucose, Bld: 148 mg/dL — ABNORMAL HIGH (ref 70–99)
Potassium: 4.4 mmol/L (ref 3.5–5.1)
Sodium: 133 mmol/L — ABNORMAL LOW (ref 135–145)
Total Bilirubin: 0.9 mg/dL (ref 0.3–1.2)
Total Protein: 7.3 g/dL (ref 6.5–8.1)

## 2019-11-17 LAB — CBC WITH DIFFERENTIAL (CANCER CENTER ONLY)
Abs Immature Granulocytes: 0.01 10*3/uL (ref 0.00–0.07)
Basophils Absolute: 0 10*3/uL (ref 0.0–0.1)
Basophils Relative: 1 %
Eosinophils Absolute: 0.2 10*3/uL (ref 0.0–0.5)
Eosinophils Relative: 3 %
HCT: 32.9 % — ABNORMAL LOW (ref 36.0–46.0)
Hemoglobin: 10.8 g/dL — ABNORMAL LOW (ref 12.0–15.0)
Immature Granulocytes: 0 %
Lymphocytes Relative: 23 %
Lymphs Abs: 1.4 10*3/uL (ref 0.7–4.0)
MCH: 29.3 pg (ref 26.0–34.0)
MCHC: 32.8 g/dL (ref 30.0–36.0)
MCV: 89.4 fL (ref 80.0–100.0)
Monocytes Absolute: 0.8 10*3/uL (ref 0.1–1.0)
Monocytes Relative: 13 %
Neutro Abs: 3.5 10*3/uL (ref 1.7–7.7)
Neutrophils Relative %: 60 %
Platelet Count: 296 10*3/uL (ref 150–400)
RBC: 3.68 MIL/uL — ABNORMAL LOW (ref 3.87–5.11)
RDW: 13.5 % (ref 11.5–15.5)
WBC Count: 5.9 10*3/uL (ref 4.0–10.5)
nRBC: 0 % (ref 0.0–0.2)

## 2019-11-17 LAB — LACTATE DEHYDROGENASE: LDH: 172 U/L (ref 98–192)

## 2019-11-17 MED ORDER — TRAMADOL HCL 50 MG PO TABS: 50.0000 mg | ORAL_TABLET | Freq: Four times a day (QID) | ORAL | 0 refills | Status: AC | PRN

## 2019-11-17 MED ORDER — OXYCODONE HCL 5 MG PO TABS
5.0000 mg | ORAL_TABLET | Freq: Four times a day (QID) | ORAL | 0 refills | Status: DC | PRN
Start: 2019-11-17 — End: 2019-12-02

## 2019-11-17 MED FILL — traMADol HCL 50 MG TABS: 50 | 7 days supply | Qty: 60 | Fill #0

## 2019-11-17 MED FILL — oxyCODONE HCL 5 MG TABS: 5 | 15 days supply | Qty: 60 | Fill #0

## 2019-11-22 ENCOUNTER — Telehealth: Payer: Self-pay | Admitting: Hematology and Oncology

## 2019-11-22 NOTE — Telephone Encounter (Signed)
Scheduled appt per 9/26 sch msg - pt is aware of appt date and time

## 2019-11-23 ENCOUNTER — Other Ambulatory Visit: Payer: Self-pay | Admitting: *Deleted

## 2019-11-23 ENCOUNTER — Telehealth: Payer: Self-pay | Admitting: *Deleted

## 2019-11-23 NOTE — Patient Outreach (Addendum)
[facility]) Care Management  11/23/2019  [name] 11-29-1935 [id number]   Subjective: Telephone call to patient's daughter/ designated party release [name]) home  / mobile number, no answer, left HIPAA compliant voicemail message, and requested call back. Telephone call from patient's daughter/ designated party release [name]) home  / mobile number, stated patient name, date of birth, and address.  States she returned call in error, unable to talk at this time,  and will touch base with RNCM at a later time.     Objective: Per KPN (Knowledge Performance Now, point of care tool) and chart review,patient hospitalized 08/24/2019 - 08/28/2019 forupper GI bleed due to tumor ingrowth/ progression into the biliary stent/ complicated with cholangitis,Adenocarcinoma of the pancreas status post uncovered metal biliary stent placement for severe malignant biliary stricture,onNovember 4825, now complicated with acute cholangitis/sepsis (endorgan failure lactic acidosis in transit hypoxic respiratory failure). Patient also has a history the following: hypertension, CAD status post bypass grafting,breast cancer status postlumpectomy, Asthma, OSA on CPAP,pancreatic adenocarcinoma, andChronic anemia with iron deficiency.   Assessment: Received Medicare EMMI General Discharge Red Flag Alert follow up referral on 09/03/2019. Red Flag Alert Trigger, Day #4, patient answered no to the following question: Scheduled follow-up?EMMI follow up completed.  Willcontinue to follow up for pancreatic cancer care coordination and other disease education/ monitoring as needed.     Plan: RNCM will call patient's daughter / designated party release [name])for  2nd telephone outreach attempt, within4business days, other diagnosis care coordinationfollow up, and proceed with case closure, after 4th unsuccessful outreach call.       [name] H. Annia Friendly,  BSN, [facility] Telephonic CM Phone: [phone] Fax: [phone]

## 2019-11-23 NOTE — Telephone Encounter (Signed)
Received call from patient's daughter, [name]. [name] was with her.  They both said that the daytime tramadol was helping as much for the last few days.  [name] has been taking the oxycodone only at night.  She states the tramadol is not as effective as it used to be. Advised to take the oxycodone 5 mg instead of the Tramadol every 4-6 hours with Tylenol in between as needed. Reminded them about the sleepiness and constipation issues with the oxycodone. They both voiced understanding and [name] is willing to try the oxycodone during the day.  Advised to call back with any questions or concerns or if the oxycodone does not help her pain.

## 2019-11-25 ENCOUNTER — Other Ambulatory Visit: Payer: Self-pay | Admitting: *Deleted

## 2019-11-25 NOTE — Patient Outreach (Signed)
[facility]) Care Management  11/25/2019  [name] 1935/12/27 [id number]   Subjective: Telephone call to patient's daughter/ designated party release [name]) home  / mobile number, spoke with [name] Haw, states she is unable to talk at this time, and requested call back at a later time.      Objective:Per KPN (Knowledge Performance Now, point of care tool) and chart review,patient hospitalized 08/24/2019 - 08/28/2019 forupper GI bleed due to tumor ingrowth/ progression into the biliary stent/ complicated with cholangitis,Adenocarcinoma of the pancreas status post uncovered metal biliary stent placement for severe malignant biliary stricture,onNovember 4627, now complicated with acute cholangitis/sepsis (endorgan failure lactic acidosis in transit hypoxic respiratory failure). Patient also has a history the following: hypertension, CAD status post bypass grafting,breast cancer status postlumpectomy, Asthma, OSA on CPAP,pancreatic adenocarcinoma, andChronic anemia with iron deficiency.   Assessment:Received Medicare EMMI General Discharge Red Flag Alert follow up referral on 09/03/2019. Red Flag Alert Trigger, Day #4, patient answered no to the following question: Scheduled follow-up?EMMI follow up completed.  Willcontinue to follow up for pancreatic cancer care coordination and other disease education/ monitoring as needed.    Plan:RNCM will call patient's daughter / designated party release [name])for  3rd telephone outreach attempt, within4business days, other diagnosis care coordinationfollow up, and proceed with case closure, after 4th unsuccessful outreach call.       Kristeen Lantz H. Annia Friendly, BSN, [place] [facility] Telephonic CM Phone: [phone] Fax: [phone]

## 2019-11-25 NOTE — Progress Notes (Signed)
COMMUNITY PALLIATIVE CARE RN NOTE  PATIENT NAME: [name] DOB: Jun 28, 1935 MRN: [id number]  PRIMARY CARE PROVIDER: Ronnald Nian, DO  RESPONSIBLE PARTY:  Acct ID - Guarantor Home Phone Work Phone Relationship Acct Type  [id number] [name] [phone]  Self P/F     [address], [place], [place] [address]   Covid-19 Pre-screening Negative  PLAN OF CARE and INTERVENTION:  1. ADVANCE CARE PLANNING/GOALS OF CARE: Goal is for patient to remain at home with her husband and avoid hospitalizations. She has a DNR and MOST form. 2. PATIENT/CAREGIVER EDUCATION: Symptom management, pain management, safe mobility 3. DISEASE STATUS: Met with patient, her husband and daughter in their home. She has been experiencing some pain in her lower back. She tells me that she had a fall on 8/28 trying to take her socks off while standing up. She went to [employer] point for x-rays. Her daughter says she was found to have a sprain in her back. She has been taking Tramadol and Tylenol on a scheduled during the day and takes Oxycodone at night. She does have some abdominal pain also, and she feels her current regimen is managing her pain well. She talks about going to a wedding recently. She really enjoyed this. She denies shortness of breath, but does feel tired most of the time. She is sleeping well during the night and takes naps during the day. She remains ambulatory without the use of assistive devices. She is independent with ADLs. Her appetite is diminished some but she is eating 3 meals/day of small portions and does have snacks in between. She does have intermittent diarrhea and takes Imodium when needed to help. She does have intermittent nausea and continues to use ginger to help. She has Zofran available, but says she has not needed to use this as of yet. Reviewed her medications and assisted daughter in figuring out a method of filling her pill box since she takes medications at 5 different  times throughout the day. Will continue to monitor.  HISTORY OF PRESENT ILLNESS: This is a 84 yo female with a diagnosis of Pancreatic cancer. Palliative care team continues to follow patient and visits monthly and PRN.    CODE STATUS: DNR  ADVANCED DIRECTIVES: N MOST FORM: yes PPS: 60%   PHYSICAL EXAM:   LUNGS: clear to auscultation  CARDIAC: Cor RRR EXTREMITIES: No edema SKIN: Exposed skin is dry and intact  NEURO: Alert and oriented x 4, pleasant mood, ambulatory   (Duration of visit and documentation 75 minutes)   Daryl Eastern, RN BSN

## 2019-11-26 ENCOUNTER — Telehealth: Payer: Self-pay | Admitting: *Deleted

## 2019-11-26 NOTE — Telephone Encounter (Signed)
Received vm message from patient's daughter, [name]. She states [name] pain is increasing and is asking to increase her oxycodone  A lot of her pain is in her back at this time. Bowels are working well. LBM was today. .Per Dr. Lorenso Courier, she can take 2 oxycodone 5 mg tabs every 4-6 hours as needed for pain.   [name] voiced understanding. Encouraged her to call back Monday  (or sooner if needed) regarding pain control.

## 2019-11-30 ENCOUNTER — Telehealth: Payer: Self-pay | Admitting: *Deleted

## 2019-11-30 NOTE — Telephone Encounter (Signed)
Received vm message from pt's daughter, [name]. She called to let us know that the 2 oxycodone tablets every 4 -6 hours has been effective in pain control. She did not need a call back.

## 2019-12-01 ENCOUNTER — Telehealth: Payer: Self-pay | Admitting: Family Medicine

## 2019-12-01 ENCOUNTER — Other Ambulatory Visit: Payer: Self-pay | Admitting: *Deleted

## 2019-12-01 ENCOUNTER — Other Ambulatory Visit: Payer: Self-pay

## 2019-12-01 ENCOUNTER — Telehealth: Payer: Self-pay | Admitting: *Deleted

## 2019-12-01 ENCOUNTER — Other Ambulatory Visit: Payer: Medicare Other

## 2019-12-01 DIAGNOSIS — Z515 Encounter for palliative care: Secondary | ICD-10-CM

## 2019-12-01 NOTE — Telephone Encounter (Signed)
Spoke to daughter per DPR who advised me she would call back when mother isn't near to ask some questions about her medications.

## 2019-12-01 NOTE — Telephone Encounter (Signed)
Patient daughter is calling and requesting a call back regarding hydralazine, please advise. CB is [phone]

## 2019-12-01 NOTE — Progress Notes (Deleted)
Patient ID: [name], female   DOB: 1936/01/19, 84 y.o.   MRN: [id number]     84 y.o. with HTN, HLD, Fibromyalgia, IBS and GERD. History of CAD and CABG 2011. 12/2013 nonischemic myovue and normal EF by echo. Intolerant to statins  W/u for jaundice and pancreatic mass Had ERCP with CBD stent d/c on 12/10/18 Had repeat ERCP with stent 08/25/19   She had COVID infection December and radiation Rx posponed but eventually done 1/11-1/21/21 Her husband also had COVID and was hospitalized for 12 days  She has lost about 10 lbs. Jaundice and pruritis are better Agree with her and Dr Marlowe Aschoff assessment that Whipple should not be performed at her age She is using tramadol, tylenol and occasional oxycodone for pain.   She is more comfort care at this time  ***  ROS: Denies fever, malais, weight loss, blurry vision, decreased visual acuity, cough, sputum, SOB, hemoptysis, pleuritic pain, palpitaitons, heartburn, abdominal pain, melena, lower extremity edema, claudication, or rash.  All other systems reviewed and negative  General: There were no vitals filed for this visit.  Affect appropriate Obese white female  HEENT: normal Neck supple with no adenopathy JVP normal no bruits no thyromegaly Lungs clear with no wheezing and good diaphragmatic motion Heart:  S1/S2 no murmur, no rub, gallop or click PMI normal Abdomen: benighn, BS positve, no tenderness, no AAA no bruit.  No HSM or HJR Distal pulses intact with no bruits No edema Neuro non-focal Skin warm and dry No muscular weakness    Current Outpatient Medications  Medication Sig Dispense Refill   acetaminophen (TYLENOL) 325 MG tablet Take 650 mg by mouth every 6 (six) hours as needed.     albuterol (VENTOLIN HFA) 108 (90 Base) MCG/ACT inhaler Inhale 2 puffs into the lungs every 6 (six) hours as needed for wheezing or shortness of breath.      EPINEPHrine 0.3 mg/0.3 mL IJ SOAJ injection Inject 0.3 mg into the muscle as  needed (as directed- FOR A SEVERE REACTION).  (Patient not taking: Reported on 11/08/2019)     ferrous sulfate 325 (65 FE) MG tablet Take 1 tablet (325 mg total) by mouth daily with breakfast. Please take with a source of Vitamin C 90 tablet 3   fexofenadine (ALLEGRA) 180 MG tablet Take 180 mg by mouth every other day.      Glucosamine-Chondroit-Vit C-Mn (GLUCOSAMINE CHONDR 1500 COMPLX PO) Take 1 tablet by mouth 2 (two) times daily.      hydrALAZINE (APRESOLINE) 50 MG tablet Take 0.5 tablets (25 mg total) by mouth 3 (three) times daily. 135 tablet 1   latanoprost (XALATAN) 0.005 % ophthalmic solution Place 1 drop into both eyes at bedtime.      losartan (COZAAR) 50 MG tablet Take 1 tablet (50 mg total) by mouth daily. 90 tablet 3   metoprolol tartrate (LOPRESSOR) 50 MG tablet Take 1 tablet (50 mg total) by mouth 3 (three) times daily. 270 tablet 3   nitroGLYCERIN (NITROSTAT) 0.4 MG SL tablet Place 1 tablet (0.4 mg total) under the tongue every 5 (five) minutes as needed for chest pain. (Patient not taking: Reported on 11/08/2019) 25 tablet 2   ondansetron (ZOFRAN) 8 MG tablet Take 1 tablet (8 mg total) by mouth every 8 (eight) hours as needed for nausea or vomiting. 30 tablet 1   oxyCODONE (OXY IR/ROXICODONE) 5 MG immediate release tablet Take 1 tablet (5 mg total) by mouth every 6 (six) hours as needed for severe pain.  60 tablet 0   pantoprazole (PROTONIX) 40 MG tablet Take 1 tablet (40 mg total) by mouth daily. 30 tablet 5   Peppermint Oil (IBGARD PO) Take by mouth. 1-2 two times a day     Psyllium (METAMUCIL FIBER PO) Take 1 Scoop by mouth daily. Mix one rounded teaspoonful into 8 oz water and drink once a day     simethicone (MYLICON) 902 MG chewable tablet Chew 125 mg by mouth every 6 (six) hours as needed for flatulence.     sodium chloride (OCEAN) 0.65 % SOLN nasal spray Place 1 spray into both nostrils daily as needed for congestion.      sucralfate (CARAFATE) 1 g tablet TAKE 1  TABLET(1 GRAM) BY MOUTH TWICE DAILY 180 tablet 0   traMADol (ULTRAM) 50 MG tablet Take 1-2 tablets (50-100 mg total) by mouth every 6 (six) hours as needed. 60 tablet 0   vitamin B-12 (CYANOCOBALAMIN) 1000 MCG tablet Take 1,000 mcg by mouth daily.       VITAMIN D PO Take 200 mcg by mouth daily with breakfast. Vitamin A  300 mcg Calcium  40 mcg     No current facility-administered medications for this visit.    Allergies  Choline fenofibrate, Hctz [hydrochlorothiazide], Simvastatin, Levaquin [levofloxacin in d5w], Adhesive [tape], Bentyl [dicyclomine hcl], Ceclor [cefaclor], Clarithromycin, Codeine, Doxycycline, Hydrocodone, Lisinopril, Penicillins, and Tobramycin-dexamethasone  Electrocardiogram:   11/21/17 SR rate 57 nonspecific ST changes 12/17/18 SR arte 67 normal   Assessment and Plan  CAD/CABG: 2011  non ishcemic myovue 2015  She will call if has to take nitro continue ASA/Beta blockerSome esophageal dysmotility causes dysphagia and ? Chest pain that confuses picture  HTN:  Improved on amlodipine   Breast Cancer   F/U Magrinat No BP's on left arm  mamogram last year ok  Chol:  Not at goal intolerant to statins given age would not suggest PSK-9 or Nexlitol   Pancreatic Cancer:  F/'u oncology and surgery Has had ERCP and stent Finished palliative radiation Rx 03/18/19 F/U CT June 29/21 with similar appearance of infiltrative mass in head of pancrease pain management per oncology   F/U  6 months   Jenkins Rouge

## 2019-12-01 NOTE — Patient Outreach (Addendum)
[facility]) Care Management  [place]  12/01/2019   [name] 19-Jan-1936 [id number]  Subjective: Telephone call from patient's daughter/ designated party release [name]) home  / mobile number, times 2, stated patient name, date of birth, and address.  States patient is doing okay and is now receiving palliative care services.   Daughter aware that patient can receive [place] Management services and palliative care services.  Daughter states patient does not have any education material, transition of care, care coordination, disease management, disease monitoring, transportation, or pharmacy needs at this time.  RNCM advised daughter, moving to another position after 12/09/2019.    States she is very appreciative of the follow up, resources provided,  and will contact [place] Management if services needed in the future.  RNCM advised daughter,  patient and patient's primary MD will receive case closure letter, daughter voices understanding.     Objective: Per KPN (Knowledge Performance Now, point of care tool) and chart review,patient hospitalized 08/24/2019 - 08/28/2019 forupper GI bleed due to tumor ingrowth/ progression into the biliary stent/ complicated with cholangitis,Adenocarcinoma of the pancreas status post uncovered metal biliary stent placement for severe malignant biliary stricture,onNovember 7096, now complicated with acute cholangitis/sepsis (endorgan failure lactic acidosis in transit hypoxic respiratory failure). Patient also has a history the following: hypertension, CAD status post bypass grafting,breast cancer status postlumpectomy, Asthma, OSA on CPAP,pancreatic adenocarcinoma, andChronic anemia with iron deficiency.    Encounter Medications:  Outpatient Encounter Medications as of 12/01/2019  Medication Sig Note  . acetaminophen (TYLENOL) 325 MG tablet Take 650 mg by mouth every 6 (six) hours as needed.   [name] albuterol  (VENTOLIN HFA) 108 (90 Base) MCG/ACT inhaler Inhale 2 puffs into the lungs every 6 (six) hours as needed for wheezing or shortness of breath.    . EPINEPHrine 0.3 mg/0.3 mL IJ SOAJ injection Inject 0.3 mg into the muscle as needed (as directed- FOR A SEVERE REACTION).  (Patient not taking: Reported on 11/08/2019) 11/08/2019: On hand  . ferrous sulfate 325 (65 FE) MG tablet Take 1 tablet (325 mg total) by mouth daily with breakfast. Please take with a source of Vitamin C   . fexofenadine (ALLEGRA) 180 MG tablet Take 180 mg by mouth every other day.    . Glucosamine-Chondroit-Vit C-Mn (GLUCOSAMINE CHONDR 1500 COMPLX PO) Take 1 tablet by mouth 2 (two) times daily.    . hydrALAZINE (APRESOLINE) 50 MG tablet Take 0.5 tablets (25 mg total) by mouth 3 (three) times daily. 10/06/2019: 1/2 tablet at night only  . latanoprost (XALATAN) 0.005 % ophthalmic solution Place 1 drop into both eyes at bedtime.    [name] losartan (COZAAR) 50 MG tablet Take 1 tablet (50 mg total) by mouth daily.   . metoprolol tartrate (LOPRESSOR) 50 MG tablet Take 1 tablet (50 mg total) by mouth 3 (three) times daily.   . nitroGLYCERIN (NITROSTAT) 0.4 MG SL tablet Place 1 tablet (0.4 mg total) under the tongue every 5 (five) minutes as needed for chest pain. (Patient not taking: Reported on 11/08/2019) 11/08/2019: On hand  . ondansetron (ZOFRAN) 8 MG tablet Take 1 tablet (8 mg total) by mouth every 8 (eight) hours as needed for nausea or vomiting.   [name] oxyCODONE (OXY IR/ROXICODONE) 5 MG immediate release tablet Take 1 tablet (5 mg total) by mouth every 6 (six) hours as needed for severe pain.   . pantoprazole (PROTONIX) 40 MG tablet Take 1 tablet (40 mg total) by mouth daily.   [name]  Peppermint Oil (IBGARD PO) Take by mouth. 1-2 two times a day   . Psyllium (METAMUCIL FIBER PO) Take 1 Scoop by mouth daily. Mix one rounded teaspoonful into 8 oz water and drink once a day   . simethicone (MYLICON) 474 MG chewable tablet Chew 125 mg by mouth every 6  (six) hours as needed for flatulence.   . sodium chloride (OCEAN) 0.65 % SOLN nasal spray Place 1 spray into both nostrils daily as needed for congestion.    . sucralfate (CARAFATE) 1 g tablet TAKE 1 TABLET(1 GRAM) BY MOUTH TWICE DAILY   . traMADol (ULTRAM) 50 MG tablet Take 1-2 tablets (50-100 mg total) by mouth every 6 (six) hours as needed.   . vitamin B-12 (CYANOCOBALAMIN) 1000 MCG tablet Take 1,000 mcg by mouth daily.     [name] VITAMIN D PO Take 200 mcg by mouth daily with breakfast. Vitamin A  300 mcg Calcium  40 mcg    No facility-administered encounter medications on file as of 12/01/2019.    Functional Status:  In your present state of health, do you have any difficulty performing the following activities: 08/24/2019 08/24/2019  Hearing? - N  Vision? - N  Difficulty concentrating or making decisions? - N  Walking or climbing stairs? - N  Dressing or bathing? - N  Doing errands, shopping? N -  Some recent data might be hidden    Fall/Depression Screening: Fall Risk  10/20/2019 09/21/2019 09/21/2018  Falls in the past year? 0 0 0  Number falls in past yr: 0 0 0  Injury with Fall? 0 0 0  Risk for fall due to : No Fall Risks Other (Comment) -  Risk for fall due to: Comment - Recent hospitalization -  Follow up Falls evaluation completed;Education provided Falls evaluation completed;Education provided;Falls prevention discussed Falls evaluation completed   PHQ 2/9 Scores 09/21/2019 05/13/2019 04/13/2018 12/20/2015 08/11/2013 07/03/2012  PHQ - 2 Score 0 0 0 0 0 1    Assessment: Received Medicare EMMI General Discharge Red Flag Alert follow up referral on 09/03/2019. Red Flag Alert Trigger, Day #4, patient answered no to the following question: Scheduled follow-up?EMMI follow up completed. Pancreatic cancer care coordination and other disease education/ monitoring completed and no further care management needs, per patient's daughter.    Goals Addressed              This Visit's  Progress   .  COMPLETED: Patient's daughter stated goal is to maintain patient's quality of life. (pt-stated)   On track     CARE PLAN ENTRY (see longitudinal plan of care for additional care plan information)  Current Barriers:  [name] Knowledge Deficits related to pancreatic cancer.  Nurse Case Manager Clinical Goal(s):  [name] Over the next 60 days, patient will not experience hospital admission. Hospital Admissions in last 6 months = 2 . Over the next 30 days, patient will attend all scheduled medical appointments: primary MD, oncologist, and gastroenterologist.  Interventions:  . Inter-disciplinary care team collaboration (see longitudinal plan of care) . Reviewed medications with patient and discussed with patient's daughter. . Advised patient, providing education and rationale, to monitor blood pressure daily and record, calling primary MD for findings outside established parameters.  . Reviewed scheduled/upcoming provider appointments including:  . Pharmacy referral for medication review and patient assistance program identification. . Patient's daughter advised [facility] Care Management can coordinate services with Palliative Care as needed  Patient Self Care Activities:  . Self administers medications as prescribed . Attends  all scheduled provider appointments . Performs ADL's independently . Patient's daughter will contact [place] Management if services needed in the future . Per patient's daughter, patient may not be aware of severity of illness at all times.   Updated 12/01/2019          Plan: RNCM will complete case closure due to follow up completed / no care management needs.  RNCM will send MD case closure letter.  RNCM will send patient case closure letter.          Shayleigh Bouldin H. Annia Friendly, BSN, [place] Management [facility] Telephonic CM Phone: [phone] Fax: [phone]

## 2019-12-01 NOTE — Telephone Encounter (Signed)
Dr. Loletha Grayer please advise.  Pt's daughter Arizona Constable called stating that her mother had recently been diagnosed with cancer. But she was concerned because she was taking her hydralazine wrong she said her mother has been taking just 1/2 tab a day instead of the 3 tabs a day. She had also been taking Metoprolol Tartate one tab a day instead of the 3 a day. When asked she told her daughter she had been doing that for years. She wanted to make sure to hear directions from the PCP so her mother can be taking medications right. She was also concerned because pt's BP had been running higher. She reports today was ok around 130/72 but on October 4th it was 155/78 an that was a morning reading. She said that it has been as high as 165-182 for the top number. Daughter requests call back tomorrow between 9-2pm on phone [phone] due to her mother overlistening.   Last OV was 10/27/2019, pt need an appointment for med verification?

## 2019-12-01 NOTE — Telephone Encounter (Signed)
Pt was diagnosed with pancreatic cancer in fall of 2020 (about 1 year ago). Yes she is to be taking hydralazine 25mg  3x/day and metoprolol 50mg  3x/day. If there are additional concerns to address, please schedule VV appt as I cannot guarantee I can call daughter during the specific time window given unless appt is scheduled

## 2019-12-01 NOTE — Addendum Note (Signed)
Addended by: Ronnald Nian on: 12/01/2019 05:10 PM   Modules accepted: Orders

## 2019-12-01 NOTE — Telephone Encounter (Signed)
REceived call from patient's daughter, [name]. She is requesting refill of pt's oxycodone 5 mg tabs. She is taking 1 - 2 tablets every 4 hours.  Dr. Lorenso Courier made aware.

## 2019-12-02 ENCOUNTER — Other Ambulatory Visit: Payer: Self-pay | Admitting: Hematology and Oncology

## 2019-12-02 MED ORDER — OXYCODONE HCL 5 MG PO TABS
5.0000 mg | ORAL_TABLET | Freq: Four times a day (QID) | ORAL | 0 refills | Status: DC | PRN
Start: 2019-12-02 — End: 2019-12-22

## 2019-12-02 MED FILL — oxyCODONE HCL 5 MG TABS: 5 | 23 days supply | Qty: 90 | Fill #0

## 2019-12-02 NOTE — Telephone Encounter (Signed)
Pt's daughter Arizona Constable per DPR was notified and was given the instructions on her Hydralazine and Metoprolol. Pt's daughter understood I also asked if she felt like they needed a virtual visit with Dr. Loletha Grayer to go over any issues and she didn't think so that she would call back an make an appointment if needed.

## 2019-12-03 ENCOUNTER — Telehealth: Payer: Self-pay | Admitting: *Deleted

## 2019-12-03 ENCOUNTER — Other Ambulatory Visit: Payer: Self-pay

## 2019-12-03 ENCOUNTER — Encounter: Payer: Self-pay | Admitting: Family Medicine

## 2019-12-03 ENCOUNTER — Ambulatory Visit (INDEPENDENT_AMBULATORY_CARE_PROVIDER_SITE_OTHER): Payer: Medicare Other | Admitting: Family Medicine

## 2019-12-03 VITALS — BP 180/76 | HR 64 | Temp 97.2°F | Ht <= 58 in | Wt 117.2 lb

## 2019-12-03 DIAGNOSIS — A6004 Herpesviral vulvovaginitis: Secondary | ICD-10-CM | POA: Diagnosis not present

## 2019-12-03 MED ORDER — VALACYCLOVIR HCL 1 G PO TABS: 1000.0000 mg | ORAL_TABLET | Freq: Two times a day (BID) | ORAL | 0 refills | Status: AC

## 2019-12-03 MED FILL — valACYclovir HCL 1 GM TABS: 1 | 10 days supply | Qty: 20 | Fill #0

## 2019-12-03 NOTE — Telephone Encounter (Signed)
Received call from pt's daughter, [name]. She is asking if it is ok for her mother to take ondansetron and oxycodone together. Advised that it is ok to take them together, The ondansetron will not make her more sleepy. [name] voiced understanding.

## 2019-12-03 NOTE — Progress Notes (Signed)
Patient: [name] MRN: [id number] DOB: 11-28-1935 PCP: Ronnald Nian, DO     Subjective:  Chief Complaint  Patient presents with   Rash    Vulva    HPI: The patient is a 84 y.o. female who presents today for irritation on her vulva noticing 2 weeks ago. She also feels bumps, but can't really see what is down there. Her daughter is here and states it looks like she has a rash on her bottom.   She has tried Monistat, but has not helped. She has no burning with urination and no blood. No issues urinating. She states it doesn't really itch. She has history of pancreatic and breast cancer. She has not wanted to eat today and had some nausea which is typical for her. She has no problems with bowel movements.  She denies any prodromal symptoms 2 weeks ago, but also feels bad. She has been married x 60 years, but is not sexually active at this time.   Review of Systems  Constitutional: Negative for chills and fever.  Genitourinary: Positive for genital sores and vaginal pain. Negative for vaginal bleeding and vaginal discharge.  Neurological: Negative for dizziness and headaches.    Allergies Patient is allergic to choline fenofibrate, hctz [hydrochlorothiazide], simvastatin, levaquin [levofloxacin in d5w], adhesive [tape], bentyl [dicyclomine hcl], ceclor [cefaclor], clarithromycin, codeine, doxycycline, hydrocodone, lisinopril, penicillins, and tobramycin-dexamethasone.  Past Medical History Patient  has a past medical history of Asthma, Atrophic vaginitis, Breast cancer, Left (12/20/2010), CAD (coronary artery disease), Cervical arthritis, Colonic polyp (04-27-2009), Diverticulosis of colon (without mention of hemorrhage), Fibromyalgia, Gallstones, GERD (gastroesophageal reflux disease), Glaucoma, Headache(784.0), Hiatal hernia, Hypercholesterolemia, Irritable bowel syndrome, Labile hypertension, Lymphedema, Pancreatic cancer ([place]) (dx'd 11/2018), Pinched vertebral nerve, Secondary  diabetes ([place]) (12/03/2018), and Sleep apnea.  Surgical History Patient  has a past surgical history that includes Coronary angioplasty with stent; Coronary artery bypass graft; Cataract extraction, bilateral; Esophageal manometry (N/A, 01/08/2016); 24 hour ph study (N/A, 01/08/2016); Colonoscopy; Esophagogastroduodenoscopy; ERCP (N/A, 11/28/2018); Biliary brushing (11/28/2018); biliary stent placement (11/28/2018); EUS (N/A, 12/10/2018); Fine needle aspiration (N/A, 12/10/2018); Esophagogastroduodenoscopy (egd) with propofol (N/A, 12/10/2018); Breast lumpectomy (Left, 02/06/11); Endoscopic retrograde cholangiopancreatography (ercp) with propofol (N/A, 01/06/2019); EUS (N/A, 01/06/2019); Fiducial marker placement (01/06/2019); Stent removal (01/06/2019); biliary stent placement (N/A, 01/06/2019); Biliary dilation (01/06/2019); Esophagogastroduodenoscopy (N/A, 01/06/2019); Sphincterotomy (01/06/2019); ERCP (N/A, 08/25/2019); Esophagogastroduodenoscopy (N/A, 08/25/2019); removal of stones (08/25/2019); and biliary stent placement (N/A, 08/25/2019).  Family History Pateint's family history includes Breast cancer in her sister; Cancer in her brother; Diabetes in her sister; Heart disease in her brother; Prostate cancer in her father; Stroke in her father and mother.  Social History Patient  reports that she has never smoked. She has never used smokeless tobacco. She reports that she does not drink alcohol and does not use drugs.    Objective: Vitals:   12/03/19 1405  BP: (!) 180/76  Pulse: 64  Temp: (!) 97.2 F (36.2 C)  TempSrc: Temporal  SpO2: 98%  Weight: 117 lb 3.2 oz (53.2 kg)  Height: 4' 9.5" (1.461 m)    Body mass index is 24.92 kg/m.  Physical Exam Vitals reviewed. Exam conducted with a chaperone present.  Constitutional:      Appearance: Normal appearance. She is obese.  HENT:     Head: Normocephalic and atraumatic.  Pulmonary:     Effort: Pulmonary effort is normal.  Genitourinary:     Labia:        Right: Rash and lesion present.  Comments: Vesicular lesions in cluster on erythematous base on mons pubis and in left groin crease into perneal area and around anus. Has clustered, white plaque like vesicles on left labia and right labia on erythematous base.  Skin:    Capillary Refill: Capillary refill takes less than 2 seconds.  Neurological:     General: No focal deficit present.     Mental Status: She is alert and oriented to person, place, and time.    Verbal consent obtained. 11 blade scalpel used to unroof lesion and culture taken.     Assessment/plan: 1. Herpes simplex vulvovaginitis HSV culture sent off. Pretty severe disease and she is immunocompromised.  10 day course of valacyclovir. Renal/liver function wnl. discussed disease in detail. She is not currently sexually active. precautions given for fever/confusion. They would like close f/u with gyn next week. Referral placed. Already on oxycodone for pain.  - Herpes simplex virus culture; Future - Ambulatory referral to Gynecology  Total time of encounter: 40 minutes total time of encounter, including 35 minutes spent in face-to-face patient care. This time includes coordination of care and counseling regarding new diagnosis, treatment, plan of care. Remainder of non-face-to-face time involved reviewing chart documents/testing relevant to the patient encounter and documentation in the medical record.  This visit occurred during the SARS-CoV-2 public health emergency.  Safety protocols were in place, including screening questions prior to the visit, additional usage of staff PPE, and extensive cleaning of exam room while observing appropriate contact time as indicated for disinfecting solutions.     Return if symptoms worsen or fail to improve.    Orma Flaming, MD [place]   12/03/2019

## 2019-12-03 NOTE — Progress Notes (Signed)
COMMUNITY PALLIATIVE CARE SW NOTE  PATIENT NAME: [name] DOB: 1935-03-12 MRN: [id number]  PRIMARY CARE PROVIDER: Ronnald Nian, DO  RESPONSIBLE PARTY:  Acct ID - Guarantor Home Phone Work Phone Relationship Acct Type  [id number] [name]  Self P/F     [address], [place], [place] [address]     PLAN OF CARE and INTERVENTIONS:             1. GOALS OF CARE/ ADVANCE CARE PLANNING: Patient's  goal to remain at home with her husband, as independent as possible. Patient would like to avoid any hospitalizations. Patient is a DNR.  2. SOCIAL/EMOTIONAL/SPIRITUAL ASSESSMENT/ INTERVENTIONS:  SW completed a home visit with patient with patient where she was present with her husband and daughter. Patient report that she is having increased fatigue and low energy.  She has resting a lot more overall. Her meals are smaller and she is having more gas. She is having increased pain where she is taking up to two oxycodone. Patient report that her pain is more manageable since her medication has changed. Patient has not had any falls since the last reported fall 3 weeks ago. She is scheduled to see Dr. Lorenso Courier on 10/27. Patient remains independent for personal care needs. SW provided supportive presence, active listening, assessed her needs and comfort, and coping of PCG. 3. PATIENT/CAREGIVER EDUCATION/ COPING: Patient and her family continue to report report that they are coping well. They appear to be a closely knit, loving and supportive of each other. No coping issues.  4. PERSONAL EMERGENCY PLAN:  911 can be activated for emergencies.  5. COMMUNITY RESOURCES COORDINATION/ HEALTH CARE NAVIGATION: SW provided reassurance of support.  6. FINANCIAL/LEGAL CONCERNS/INTERVENTIONS: None.      SOCIAL HX:  Social History   Tobacco Use  . Smoking status: Never Smoker  . Smokeless tobacco: Never Used  Substance Use Topics  . Alcohol use: No    Alcohol/week: 0.0 standard drinks     CODE STATUS: DNR ADVANCED DIRECTIVES: No  MOST FORM COMPLETE:  No HOSPICE EDUCATION PROVIDED: No  PPS: Patient ambulates independently. She is independent for all ADL's. Patient is having increased fatigue.    Duration of visit and documentation: 60 minutes.       [address] [place], [place]

## 2019-12-03 NOTE — Patient Instructions (Signed)
im doing a culture to confirm this, but clinically this appears to be herpes.  Treating you with medicine called valacyclovir. Take twice a day for 10 days.  If not getting better please f/u with pcp or if worsening symptoms.   Genital Herpes Genital herpes is a common sexually transmitted infection (STI) that is caused by a virus. The virus spreads from person to person through sexual contact. Infection can cause itching, blisters, and sores around the genitals or rectum. Symptoms may last several days and then go away This is called an outbreak. However, the virus remains in your body, so you may have more outbreaks in the future. The time between outbreaks varies and can be months or years. Genital herpes affects men and women. It is particularly concerning for pregnant women because the virus can be passed to the baby during delivery and can cause serious problems. Genital herpes is also a concern for people who have a weak disease-fighting (immune) system. What are the causes? This condition is caused by the herpes simplex virus (HSV) type 1 or type 2. The virus may spread through:  Sexual contact with an infected person, including vaginal, anal, and oral sex.  Contact with fluid from a herpes sore.  The skin. This means that you can get herpes from an infected partner even if he or she does not have a visible sore or does not know that he or she is infected. What increases the risk? You are more likely to develop this condition if:  You have sex with many partners.  You do not use latex condoms during sex. What are the signs or symptoms? Most people do not have symptoms (asymptomatic) or have mild symptoms that may be mistaken for other skin problems. Symptoms may include:  Small red bumps near the genitals, rectum, or mouth. These bumps turn into blisters and then turn into sores.  Flu-like symptoms, including: ? Fever. ? Body aches. ? Swollen lymph nodes. ? Headache.  Painful  urination.  Pain and itching in the genital area or rectal area.  Vaginal discharge.  Tingling or shooting pain in the legs and buttocks. Generally, symptoms are more severe and last longer during the first (primary) outbreak. Flu-like symptoms are also more common during the primary outbreak. How is this diagnosed? Genital herpes may be diagnosed based on:  A physical exam.  Your medical history.  Blood tests.  A test of a fluid sample (culture) from an open sore. How is this treated? There is no cure for this condition, but treatment with antiviral medicines that are taken by mouth (orally) can do the following:  Speed up healing and relieve symptoms.  Help to reduce the spread of the virus to sexual partners.  Limit the chance of future outbreaks, or make future outbreaks shorter.  Lessen symptoms of future outbreaks. Your health care provider may also recommend pain relief medicines, such as aspirin or ibuprofen. Follow these instructions at home: Sexual activity  Do not have sexual contact during active outbreaks.  Practice safe sex. Latex condoms and female condoms may help prevent the spread of the herpes virus. General instructions  Keep the affected areas dry and clean.  Take over-the-counter and prescription medicines only as told by your health care provider.  Avoid rubbing or touching blisters and sores. If you do touch blisters or sores: ? Wash your hands thoroughly with soap and water. ? Do not touch your eyes afterward.  To help relieve pain or itching, you may  take the following actions as directed by your health care provider: ? Apply a cold, wet cloth (cold compress) to affected areas 4-6 times a day. ? Apply a substance that protects your skin and reduces bleeding (astringent). ? Apply a gel that helps relieve pain around sores (lidocaine gel). ? Take a warm, shallow bath that cleans the genital area (sitz bath).  Keep all follow-up visits as told  by your health care provider. This is important. How is this prevented?  Use condoms. Although anyone can get genital herpes during sexual contact, even with the use of a condom, a condom can provide some protection.  Avoid having multiple sexual partners.  Talk with your sexual partner about any symptoms either of you may have. Also, talk with your partner about any history of STIs.  Get tested for STIs before you have sex. Ask your partner to do the same.  Do not have sexual contact if you have symptoms of genital herpes. Contact a health care provider if:  Your symptoms are not improving with medicine.  Your symptoms return.  You have new symptoms.  You have a fever.  You have abdominal pain.  You have redness, swelling, or pain in your eye.  You notice new sores on other parts of your body.  You are a woman and experience bleeding between menstrual periods.  You have had herpes and you become pregnant or plan to become pregnant. Summary  Genital herpes is a common sexually transmitted infection (STI) that is caused by the herpes simplex virus (HSV) type 1 or type 2.  These viruses are most often spread through sexual contact with an infected person.  You are more likely to develop this condition if you have sex with many partners or you have unprotected sex.  Most people do not have symptoms (asymptomatic) or have mild symptoms that may be mistaken for other skin problems. Symptoms occur as outbreaks that may happen months or years apart.  There is no cure for this condition, but treatment with oral antiviral medicines can reduce symptoms, reduce the chance of spreading the virus to a partner, prevent future outbreaks, or shorten future outbreaks. This information is not intended to replace advice given to you by your health care provider. Make sure you discuss any questions you have with your health care provider. Document Revised: 08/18/2017 Document Reviewed:  01/12/2016 Elsevier Patient Education  [place].

## 2019-12-06 ENCOUNTER — Telehealth: Payer: Self-pay

## 2019-12-06 NOTE — Telephone Encounter (Signed)
Called and spoke with pt daughter and she states Dr. Rogers Blocker had called and answered her questions.

## 2019-12-06 NOTE — Telephone Encounter (Signed)
Patients daughter has a few questions regarding her mothers appointment she had with Dr.Wolfe on Friday, Daughter is asking for a call back when able ?

## 2019-12-06 NOTE — Telephone Encounter (Signed)
Called and talked with lisa. Very concerned regarding possible diagnosis. Discussed [facility] can not do culture and we are trying to see if [facility] will. If not we can send to [facility] and they can come back for another culture if that is something they want to do vs. Follow up with gyn.  Im so sorry about all of the mix up with lab/returning/etc. This is not easy.  Dr. Rogers Blocker

## 2019-12-07 ENCOUNTER — Telehealth: Payer: Self-pay | Admitting: Family Medicine

## 2019-12-07 LAB — TIQ-NTM

## 2019-12-07 LAB — HERPES SIMPLEX VIRUS CULTURE

## 2019-12-07 NOTE — Telephone Encounter (Signed)
Patient daughter is calling and requesting a call back regarding recent diagnosis, please advise. CB is [phone]

## 2019-12-07 NOTE — Telephone Encounter (Signed)
Hey Dr Loletha Grayer,  Called patient's daughter, [name], at [phone], she would like if you would review patient's visit with Dr Rogers Blocker on 12/03/19. Daughter wants to know if she needs to go to the OBGYN (appt 12/09/19)or can you take care of this.  There has been a slight improvement since starting the Valtrex and daughter was advised that medication starts working that it probably is the diagnosis that was put in at that [facility].  Culture wasn't done due to lab ?'s and daughter would like to know if another culture needs done if things have started to get better.   Pt's daughter will need to call and cancel the OBGYN appt by 10 am tomorrow if you will take over the treatment.   Please and review and advise on sensitive information.   Thanks. Dm/cma

## 2019-12-08 MED FILL — PANTOPRAZOLE SOD DR 40 MG T: 40 | 30 days supply | Qty: 30 | Fill #2

## 2019-12-08 NOTE — Telephone Encounter (Signed)
Spoke to pt's daughter, [name], advised to push out the GYN appt till after the full treatment of medication is finished.  Then if not completely over symptoms then keep the GYN appt and if it clears she can cancel it..  Pt's daughter is in agreement with plan and says thank you for the advise. Dm/cma

## 2019-12-08 NOTE — Telephone Encounter (Signed)
Pt should keep GYN appt

## 2019-12-09 ENCOUNTER — Ambulatory Visit (INDEPENDENT_AMBULATORY_CARE_PROVIDER_SITE_OTHER): Payer: Medicare Other | Admitting: Obstetrics and Gynecology

## 2019-12-09 ENCOUNTER — Encounter: Payer: Self-pay | Admitting: Obstetrics and Gynecology

## 2019-12-09 ENCOUNTER — Other Ambulatory Visit: Payer: Self-pay

## 2019-12-09 VITALS — BP 134/68 | HR 75 | Ht <= 58 in | Wt 115.4 lb

## 2019-12-09 DIAGNOSIS — N766 Ulceration of vulva: Secondary | ICD-10-CM

## 2019-12-09 MED ORDER — LIDOCAINE 5 % EX OINT: 1.0000 "application " | TOPICAL_OINTMENT | Freq: Four times a day (QID) | CUTANEOUS | 0 refills | Status: AC | PRN

## 2019-12-09 NOTE — Progress Notes (Signed)
84 y.o. [id number] Married White or Caucasian Not Hispanic or Latino female here for HSV diagnosis and vulvoginitis.    She was seen by Dr Rogers Blocker on 12/03/19 and started on valtrex for presumed HSV.  The rash started approximately 3 weeks ago. She tried monistat which didn't help. She felt some bumps. Doesn't hurt to void.  She is feeling better since she started the valtrex.  She hasn't been sexually active for several years. States her husband does have a h/o cold sores.   She is on oxycodone for her pancreatic cancer.   H/O pancreatic and breast cancer.     Patient's last menstrual period was 11/26/1990.          Sexually active: No.  The current method of family planning is post menopausal status.    Exercising: Yes.    walking 3xweekly  Smoker:  no  Health Maintenance: Pap:  20198 WNL  History of abnormal Pap:  no MMG:  2019 Bi-rads 2 benign  BMD: 04/16/17   Low bone mass  Colonoscopy: 2016 no repeat due to age.  TDaP:  2011 Gardasil: NA   reports that she has never smoked. She has never used smokeless tobacco. She reports that she does not drink alcohol and does not use drugs.  Past Medical History:  Diagnosis Date  . Asthma   . Atrophic vaginitis   . Breast cancer, Left 12/20/2010   NO BLOOD PRESSURE CHECKS OR STICKS IN LEFT ARM  . CAD (coronary artery disease)    a. 01/19/2010 s/p CABG x 3, lima->lad, vg->diag, vg->om1;  b. 06/2011 :Lexi MV: EF 84%, No ischemia. c. 01/06/14 s/p negative nuclear stress test with EF >70%  . Cervical arthritis   . Colonic polyp 04-27-2009   tubular adenoma  . Diverticulosis of colon (without mention of hemorrhage)   . Fibromyalgia   . Gallstones   . GERD (gastroesophageal reflux disease)   . Glaucoma   . Headache(784.0)    a. frequently assocaited with high BPs  . Hiatal hernia   . Hypercholesterolemia   . Irritable bowel syndrome   . Labile hypertension   . Lymphedema    left arm  . Pancreatic cancer ([place]) dx'd 11/2018  . Pinched  vertebral nerve   . Secondary diabetes ([place]) 12/03/2018  . Sleep apnea     Past Surgical History:  Procedure Laterality Date  . [place] STUDY N/A 01/08/2016   Procedure: [place] STUDY;  Surgeon: Ladene Artist, MD;  Location: [facility] ENDOSCOPY;  Service: Endoscopy;  Laterality: N/A;  . BILIARY BRUSHING  11/28/2018   Procedure: BILIARY BRUSHING;  Surgeon: Gatha Mayer, MD;  Location: [facility] ENDOSCOPY;  Service: Endoscopy;;  . BILIARY DILATION  01/06/2019   Procedure: BILIARY DILATION;  Surgeon: Irving Copas., MD;  Location: [facility] ENDOSCOPY;  Service: Endoscopy;;  . BILIARY STENT PLACEMENT  11/28/2018   Procedure: BILIARY STENT PLACEMENT;  Surgeon: Gatha Mayer, MD;  Location: [facility] ENDOSCOPY;  Service: Endoscopy;;  . BILIARY STENT PLACEMENT N/A 01/06/2019   Procedure: BILIARY STENT PLACEMENT;  Surgeon: Irving Copas., MD;  Location: [facility] ENDOSCOPY;  Service: Endoscopy;  Laterality: N/A;  . BILIARY STENT PLACEMENT N/A 08/25/2019   Procedure: BILIARY STENT PLACEMENT;  Surgeon: Jackquline Denmark, MD;  Location: [facility] ENDOSCOPY;  Service: Endoscopy;  Laterality: N/A;  . BREAST LUMPECTOMY Left 02/06/11  . CATARACT EXTRACTION, BILATERAL     bilateral caaract removal,  . COLONOSCOPY    . CORONARY ANGIOPLASTY WITH STENT PLACEMENT  Stent 2007  . CORONARY ARTERY BYPASS GRAFT    . ENDOSCOPIC RETROGRADE CHOLANGIOPANCREATOGRAPHY (ERCP) WITH PROPOFOL N/A 01/06/2019   Procedure: ENDOSCOPIC RETROGRADE CHOLANGIOPANCREATOGRAPHY (ERCP) WITH PROPOFOL;  Surgeon: Rush Landmark Telford Nab., MD;  Location: [facility]OS[facility];  Service: Endoscopy;  Laterality: N/A;  [id number]  . ERCP N/A 11/28/2018   Procedure: ENDOSCOPIC RETROGRADE CHOLANGIOPANCREATOGRAPHY (ERCP);  Surgeon: Gatha Mayer, MD;  Location: [facility] ENDOSCOPY;  Service: Endoscopy;  Laterality: N/A;  . ERCP N/A 08/25/2019   Procedure: ERCP with stent  ;  Surgeon: Jackquline Denmark, MD;  Location: [facility] ENDOS[facility];  Service: Endoscopy;  Laterality: N/A;  . ESOPHAGEAL  MANOMETRY N/A 01/08/2016   Procedure: ESOPHAGEAL MANOMETRY (EM);  Surgeon: Ladene Artist, MD;  Location: [facility]OS[facility];  Service: Endoscopy;  Laterality: N/A;  . ESOPHAGOGASTRODUODENOSCOPY    . ESOPHAGOGASTRODUODENOSCOPY N/A 01/06/2019   Procedure: ESOPHAGOGASTRODUODENOSCOPY (EGD);  Surgeon: Rush Landmark Telford Nab., MD;  Location: [facility] END[facility];  Service: Endoscopy;  Laterality: N/A;  . ESOPHAGOGASTRODUODENOSCOPY N/A 08/25/2019   Procedure: ESOPHAGOGASTRODUODENOSCOPY (EGD);  Surgeon: Jackquline Denmark, MD;  Location: [facility] ENDOSCOPY;  Service: Endoscopy;  Laterality: N/A;  . ESOPHAGOGASTRODUODENOSCOPY (EGD) WITH PROPOFOL N/A 12/10/2018   Procedure: ESOPHAGOGASTRODUODENOSCOPY (EGD) WITH PROPOFOL;  Surgeon: [name], MD;  Location: [facility] END[facility];  Service: Endoscopy;  Laterality: N/A;  . EUS N/A 12/10/2018   Procedure: UPPER ENDOSCOPIC ULTRASOUND (EUS) RADIAL;  Surgeon: [name], MD;  Location: [facility];  Service: Endoscopy;  Laterality: N/A;  . EUS N/A 01/06/2019   Procedure: FULL UPPER ENDOSCOPIC ULTRASOUND (EUS) RADIAL;  Surgeon: Rush Landmark Telford Nab., MD;  Location: [facility];  Service: Endoscopy;  Laterality: N/A;  . FIDUCIAL MARKER PLACEMENT  01/06/2019   Procedure: FIDUCIAL MARKER PLACEMENT;  Surgeon: Rush Landmark Telford Nab., MD;  Location: [facility] ENDOS[facility];  Service: Endoscopy;;  . FINE NEEDLE ASPIRATION N/A 12/10/2018   Procedure: FINE NEEDLE ASPIRATION (FNA) LINEAR;  Surgeon: [name], MD;  Location: [facility] ENDOS[facility];  Service: Endoscopy;  Laterality: N/A;  . REMOVAL OF STONES  08/25/2019   Procedure: REMOVAL OF STONES;  Surgeon: Jackquline Denmark, MD;  Location: [facility] ENDOS[facility];  Service: Endoscopy;;  . [name]  01/06/2019   Procedure: [name];  Surgeon: Irving Copas., MD;  Location: [facility] ENDOS[facility];  Service: Endoscopy;;  . STENT REMOVAL  01/06/2019   Procedure: STENT REMOVAL;  Surgeon: Irving Copas., MD;  Location: [facility] ENDOSCOPY;  Service:  Endoscopy;;    Current Outpatient Medications  Medication Sig Dispense Refill  . acetaminophen (TYLENOL) 325 MG tablet Take 650 mg by mouth every 6 (six) hours as needed.    [name] albuterol (VENTOLIN HFA) 108 (90 Base) MCG/ACT inhaler Inhale 2 puffs into the lungs every 6 (six) hours as needed for wheezing or shortness of breath.     . EPINEPHrine 0.3 mg/0.3 mL IJ SOAJ injection Inject 0.3 mg into the muscle as needed (as directed- FOR A SEVERE REACTION).     . ferrous sulfate 325 (65 FE) MG tablet Take 1 tablet (325 mg total) by mouth daily with breakfast. Please take with a source of Vitamin C 90 tablet 3  . fexofenadine (ALLEGRA) 180 MG tablet Take 180 mg by mouth every other day.     . Glucosamine-Chondroit-Vit C-Mn (GLUCOSAMINE CHONDR 1500 COMPLX PO) Take 1 tablet by mouth 2 (two) times daily.     . hydrALAZINE (APRESOLINE) 50 MG tablet Take 0.5 tablets (25 mg total) by mouth 3 (three) times daily. 135 tablet 1  . latanoprost (XALATAN) 0.005 % ophthalmic solution Place 1 drop into both  eyes at bedtime.     [name] losartan (COZAAR) 50 MG tablet Take 1 tablet (50 mg total) by mouth daily. 90 tablet 3  . metoprolol tartrate (LOPRESSOR) 50 MG tablet Take 1 tablet (50 mg total) by mouth 3 (three) times daily. 270 tablet 3  . ondansetron (ZOFRAN) 8 MG tablet Take 1 tablet (8 mg total) by mouth every 8 (eight) hours as needed for nausea or vomiting. 30 tablet 1  . oxyCODONE (OXY IR/ROXICODONE) 5 MG immediate release tablet Take 1 tablet (5 mg total) by mouth every 6 (six) hours as needed for severe pain. 90 tablet 0  . Peppermint Oil (IBGARD PO) Take by mouth. 1-2 two times a day    . Psyllium (METAMUCIL FIBER PO) Take 1 Scoop by mouth daily. Mix one rounded teaspoonful into 8 oz water and drink once a day    . simethicone (MYLICON) 001 MG chewable tablet Chew 125 mg by mouth every 6 (six) hours as needed for flatulence.    . sodium chloride (OCEAN) 0.65 % SOLN nasal spray Place 1 spray into both nostrils  daily as needed for congestion.     . sucralfate (CARAFATE) 1 g tablet TAKE 1 TABLET(1 GRAM) BY MOUTH TWICE DAILY 180 tablet 0  . traMADol (ULTRAM) 50 MG tablet Take 1-2 tablets (50-100 mg total) by mouth every 6 (six) hours as needed. 60 tablet 0  . valACYclovir (VALTREX) 1000 MG tablet Take 1 tablet (1,000 mg total) by mouth 2 (two) times daily. 20 tablet 0  . vitamin B-12 (CYANOCOBALAMIN) 1000 MCG tablet Take 1,000 mcg by mouth daily.      [name] VITAMIN D PO Take 200 mcg by mouth daily with breakfast. Vitamin A  300 mcg Calcium  40 mcg    . pantoprazole (PROTONIX) 40 MG tablet Take 1 tablet (40 mg total) by mouth daily. 30 tablet 5   No current facility-administered medications for this visit.    Family History  Problem Relation Age of Onset  . Stroke Mother   . Stroke Father   . Prostate cancer Father   . Breast cancer Sister   . Diabetes Sister   . Cancer Brother        gland cancer  . Heart disease Brother   . Colon cancer Neg Hx   . Stomach cancer Neg Hx   . Pancreatic cancer Neg Hx   . Kidney disease Neg Hx   . Liver disease Neg Hx     Review of Systems  Genitourinary: Positive for genital sores.  All other systems reviewed and are negative.   Exam:   BP 134/68   Pulse 75   Ht 4\' 10"  (1.473 m)   Wt 115 lb 6.4 oz (52.3 kg)   LMP 11/26/1990   SpO2 97%   BMI 24.12 kg/m   Weight change: @WEIGHTCHANGE @ Height:   Height: 4\' 10"  (147.3 cm)  Ht Readings from Last 3 Encounters:  12/09/19 4\' 10"  (1.473 m)  12/03/19 4' 9.5" (1.461 m)  11/17/19 4' 9.5" (1.461 m)    General appearance: alert, cooperative and appears stated age Head: Normocephalic, without obvious abnormality, atraumatic   Pelvic: External genitalia:  Multiple ulcers in various stages of healing on the right vulva and inner buttock.              Urethra:  normal appearing urethra with no masses, tenderness or lesions              Bartholins and Skenes: normal  Vagina: normal appearing  vagina with normal color and discharge, no lesions              Cervix: no lesions                Terence Lux chaperoned for the exam.  A:  Vulvar ulcers c/w primary genital hsv infection, also discussed the possibility of herpes zoster (she has been vaccinated)  P:   HSV PCR collected  Agree with Dr Shelby Mattocks management, continue the Valtrex  Given a script for topical lidocaine ointment  Discussed herpes, reviewed transmission, discussed that she may or may not have recurring outbreaks. Discussed possible prn treatment vs suppression depending on how she does.  Declines STD testing  The patient was seen with her daughter

## 2019-12-09 NOTE — Patient Instructions (Signed)

## 2019-12-10 ENCOUNTER — Telehealth: Payer: Self-pay

## 2019-12-10 NOTE — Telephone Encounter (Signed)
Spoke with pt's daughter [name]. Ok per [employer]. Daughter states wanting to know results from pt on 10/14 visit. [name] was at [place] with pt yesterday.   [name] advised results not back yet. Will call when resulted. [name] states would like to be the ONLY one called instead of mother(pt) for results due to current health issues. [name] is on pt's DPR.   Advised will send message to Dr Talbert Nan, triage float pool and Eustace Pen to make aware. [name] thankful for call returned.   Routing to Dr Talbert Nan, Woody Seller, RN for [facility]

## 2019-12-10 NOTE — Telephone Encounter (Signed)
Patient's daughter [name] (ok per dpr) calling to speak with nurse regarding test results and what they may show.

## 2019-12-14 ENCOUNTER — Ambulatory Visit: Payer: Medicare Other | Admitting: Cardiovascular Disease

## 2019-12-14 LAB — HSV NAA
HSV 1 NAA: NEGATIVE
HSV 2 NAA: NEGATIVE

## 2019-12-14 NOTE — Telephone Encounter (Signed)
HSV results are in Epic   Routing to Dr Talbert Nan for review and recommendations.

## 2019-12-14 NOTE — Telephone Encounter (Signed)
See result note on 12/14/19. Encounter closed

## 2019-12-15 ENCOUNTER — Encounter: Payer: Self-pay | Admitting: Family Medicine

## 2019-12-21 ENCOUNTER — Other Ambulatory Visit: Payer: Self-pay | Admitting: Hematology and Oncology

## 2019-12-21 DIAGNOSIS — C259 Malignant neoplasm of pancreas, unspecified: Secondary | ICD-10-CM

## 2019-12-22 ENCOUNTER — Other Ambulatory Visit: Payer: Self-pay

## 2019-12-22 ENCOUNTER — Inpatient Hospital Stay: Payer: Medicare Other | Attending: Hematology and Oncology | Admitting: Hematology and Oncology

## 2019-12-22 ENCOUNTER — Inpatient Hospital Stay: Payer: Medicare Other

## 2019-12-22 ENCOUNTER — Encounter: Payer: Self-pay | Admitting: Hematology and Oncology

## 2019-12-22 VITALS — BP 156/70 | Temp 97.5°F | Resp 18 | Ht <= 58 in | Wt 113.3 lb

## 2019-12-22 DIAGNOSIS — D500 Iron deficiency anemia secondary to blood loss (chronic): Secondary | ICD-10-CM | POA: Diagnosis not present

## 2019-12-22 DIAGNOSIS — Z888 Allergy status to other drugs, medicaments and biological substances status: Secondary | ICD-10-CM | POA: Insufficient documentation

## 2019-12-22 DIAGNOSIS — C250 Malignant neoplasm of head of pancreas: Secondary | ICD-10-CM | POA: Diagnosis not present

## 2019-12-22 DIAGNOSIS — Z923 Personal history of irradiation: Secondary | ICD-10-CM | POA: Insufficient documentation

## 2019-12-22 DIAGNOSIS — K921 Melena: Secondary | ICD-10-CM | POA: Insufficient documentation

## 2019-12-22 DIAGNOSIS — G4733 Obstructive sleep apnea (adult) (pediatric): Secondary | ICD-10-CM | POA: Insufficient documentation

## 2019-12-22 DIAGNOSIS — K219 Gastro-esophageal reflux disease without esophagitis: Secondary | ICD-10-CM | POA: Insufficient documentation

## 2019-12-22 DIAGNOSIS — C787 Secondary malignant neoplasm of liver and intrahepatic bile duct: Secondary | ICD-10-CM | POA: Insufficient documentation

## 2019-12-22 DIAGNOSIS — J439 Emphysema, unspecified: Secondary | ICD-10-CM | POA: Diagnosis not present

## 2019-12-22 DIAGNOSIS — C259 Malignant neoplasm of pancreas, unspecified: Secondary | ICD-10-CM

## 2019-12-22 DIAGNOSIS — I700 Atherosclerosis of aorta: Secondary | ICD-10-CM | POA: Insufficient documentation

## 2019-12-22 DIAGNOSIS — Z885 Allergy status to narcotic agent status: Secondary | ICD-10-CM | POA: Diagnosis not present

## 2019-12-22 DIAGNOSIS — Z79899 Other long term (current) drug therapy: Secondary | ICD-10-CM | POA: Insufficient documentation

## 2019-12-22 DIAGNOSIS — E139 Other specified diabetes mellitus without complications: Secondary | ICD-10-CM | POA: Diagnosis not present

## 2019-12-22 DIAGNOSIS — G893 Neoplasm related pain (acute) (chronic): Secondary | ICD-10-CM | POA: Insufficient documentation

## 2019-12-22 DIAGNOSIS — Z881 Allergy status to other antibiotic agents status: Secondary | ICD-10-CM | POA: Insufficient documentation

## 2019-12-22 DIAGNOSIS — K859 Acute pancreatitis without necrosis or infection, unspecified: Secondary | ICD-10-CM | POA: Diagnosis not present

## 2019-12-22 DIAGNOSIS — M47816 Spondylosis without myelopathy or radiculopathy, lumbar region: Secondary | ICD-10-CM | POA: Diagnosis not present

## 2019-12-22 DIAGNOSIS — Z853 Personal history of malignant neoplasm of breast: Secondary | ICD-10-CM | POA: Diagnosis not present

## 2019-12-22 DIAGNOSIS — I2510 Atherosclerotic heart disease of native coronary artery without angina pectoris: Secondary | ICD-10-CM | POA: Insufficient documentation

## 2019-12-22 DIAGNOSIS — M5137 Other intervertebral disc degeneration, lumbosacral region: Secondary | ICD-10-CM | POA: Insufficient documentation

## 2019-12-22 DIAGNOSIS — K2980 Duodenitis without bleeding: Secondary | ICD-10-CM | POA: Diagnosis not present

## 2019-12-22 DIAGNOSIS — Z880 Allergy status to penicillin: Secondary | ICD-10-CM | POA: Diagnosis not present

## 2019-12-22 DIAGNOSIS — Z8719 Personal history of other diseases of the digestive system: Secondary | ICD-10-CM | POA: Diagnosis not present

## 2019-12-22 DIAGNOSIS — J432 Centrilobular emphysema: Secondary | ICD-10-CM | POA: Insufficient documentation

## 2019-12-22 LAB — CMP (CANCER CENTER ONLY)
ALT: 12 U/L (ref 0–44)
AST: 15 U/L (ref 15–41)
Albumin: 3.4 g/dL — ABNORMAL LOW (ref 3.5–5.0)
Alkaline Phosphatase: 124 U/L (ref 38–126)
Anion gap: 8 (ref 5–15)
BUN: <4 mg/dL — ABNORMAL LOW (ref 8–23)
CO2: 29 mmol/L (ref 22–32)
Calcium: 9.5 mg/dL (ref 8.9–10.3)
Chloride: 94 mmol/L — ABNORMAL LOW (ref 98–111)
Creatinine: 0.68 mg/dL (ref 0.44–1.00)
GFR, Estimated: >60 mL/min (ref >60)
Glucose, Bld: 168 mg/dL — ABNORMAL HIGH (ref 70–99)
Potassium: 3.9 mmol/L (ref 3.5–5.1)
Sodium: 131 mmol/L — ABNORMAL LOW (ref 135–145)
Total Bilirubin: 0.8 mg/dL (ref 0.3–1.2)
Total Protein: 7.2 g/dL (ref 6.5–8.1)

## 2019-12-22 LAB — CBC WITH DIFFERENTIAL (CANCER CENTER ONLY)
Abs Immature Granulocytes: 0.01 10*3/uL (ref 0.00–0.07)
Basophils Absolute: 0.1 10*3/uL (ref 0.0–0.1)
Basophils Relative: 1 %
Eosinophils Absolute: 0.1 10*3/uL (ref 0.0–0.5)
Eosinophils Relative: 2 %
HCT: 35.3 % — ABNORMAL LOW (ref 36.0–46.0)
Hemoglobin: 11.6 g/dL — ABNORMAL LOW (ref 12.0–15.0)
Immature Granulocytes: 0 %
Lymphocytes Relative: 18 %
Lymphs Abs: 1.1 10*3/uL (ref 0.7–4.0)
MCH: 28.9 pg (ref 26.0–34.0)
MCHC: 32.9 g/dL (ref 30.0–36.0)
MCV: 87.8 fL (ref 80.0–100.0)
Monocytes Absolute: 0.5 10*3/uL (ref 0.1–1.0)
Monocytes Relative: 9 %
Neutro Abs: 4.2 10*3/uL (ref 1.7–7.7)
Neutrophils Relative %: 70 %
Platelet Count: 310 10*3/uL (ref 150–400)
RBC: 4.02 MIL/uL (ref 3.87–5.11)
RDW: 14.2 % (ref 11.5–15.5)
WBC Count: 5.9 10*3/uL (ref 4.0–10.5)
nRBC: 0 % (ref 0.0–0.2)

## 2019-12-22 LAB — IRON AND TIBC
Iron: 37 ug/dL — ABNORMAL LOW (ref 41–142)
Saturation Ratios: 15 % — ABNORMAL LOW (ref 21–57)
TIBC: 250 ug/dL (ref 236–444)
UIBC: 213 ug/dL (ref 120–384)

## 2019-12-22 LAB — FERRITIN: Ferritin: 179 ng/mL (ref 11–307)

## 2019-12-22 MED ORDER — OXYCODONE HCL ER 15 MG PO T12A
15.0000 mg | EXTENDED_RELEASE_TABLET | Freq: Two times a day (BID) | ORAL | 0 refills | Status: DC
Start: 2019-12-22 — End: 2019-12-23

## 2019-12-22 MED ORDER — OXYCODONE HCL 5 MG PO TABS: 5.0000 mg | ORAL_TABLET | Freq: Four times a day (QID) | ORAL | 0 refills | Status: DC | PRN

## 2019-12-22 MED ORDER — PROCHLORPERAZINE MALEATE 10 MG PO TABS: 10.0000 mg | ORAL_TABLET | Freq: Four times a day (QID) | ORAL | 0 refills | Status: AC | PRN

## 2019-12-22 MED FILL — PROCHLORPERAZINE 10 MG TAB: 10 | 7 days supply | Qty: 30 | Fill #0

## 2019-12-22 MED FILL — oxyCODONE HCL 5 MG TABS: 5 | 22 days supply | Qty: 90 | Fill #0

## 2019-12-22 NOTE — Progress Notes (Signed)
[place] [phone]   [phone]  PROGRESS NOTE  Patient Care Team: Ronnald Nian, DO as PCP - General (Family Medicine) Josue Hector, MD as PCP - Cardiology (Cardiology) Neldon Mc, MD as Surgeon (General Surgery) Gery Pray, MD (Radiation Oncology) Magrinat, Virgie Dad, MD (Hematology and Oncology) Cameron Sprang, MD as Consulting Physician (Neurology) Placke, Dawn, RN (Inactive) as Registered Nurse Placke, Dawn, RN (Inactive) as Oncology Nurse Navigator  Hematological/Oncological History  # Pancreatic Adenocarcinoma Stage IA (T1N0M0). Locally Advanced, Unresectable 1) 11/26/2018: patient had visit with PCP for pruritis, found to have elevated LFTs 2) 11/27/2018: patient presented to the ED. Underwent CT scan which revealed 1.9 x 1.6 x 1.6 cm cm mass in the head of the pancreas with biliary duct dilation 3) 11/28/2018: ERCP performed to place stent for biliary obstruction  4) 12/10/2018: EUS w/ FNA reveals adenocarcinoma of the pancreas 5) 12/18/2018: establish care with Dr. Lorenso Courier  6) 12/23/2018: surgical visit with Dr. Barry Dienes 7) 12/30/2018: radiation oncology visit with Dr. Sondra Come. Agreeable to SBRT to pancreatic mass.  8) 01/2019: Radiation delayed due to infection with COVID-19.  9) 03/08/2019-03/18/2019: Palliative radiation therapy to pancreatic mass 10) 05/06/2019: CT scan abdomen shows modest increase in tumor size with no clear evidence of metastatic spread.  11) 08/25/2019: ERCP performed while admitted due to acute drop in Hgb and marked increase in LFTs.   #Breast Cancer T2 N0, stage IIA invasive ductal carcinoma, grade 3,  ER+/PR+/HER-2 not amplified 1) December 2012:  leftlumpectomy and sentinel lymph node biopsy  2) March 2013: completed radiation therapy, started on letrozole 3) Feb 2018: completed letrozole therapy   HISTORY OF PRESENTING ILLNESS:  [name] 84 y.o. female with medical history  significant for CAD s/p CABG in 2011, OSA on CPAP, Breast Cancer, and GERD who presents for a follow up for adenocarcinoma of the pancreas. She was last seen in our clinic on 11/17/2019. In the interim she has had a shingles infection.    On exam today [name] unfortunately notes that her abdominal pain has been continuing to progressively worsen.  She notes that the "squeezing" pain has become more severe since our last visit.  She is currently taking approximately 25 to 30 mg of the oxycodone daily.  She reports that currently as she is sitting her pain is 8 out of 10 in severity and that it is about 3-4 at its best.  She reports that she is also not eating well and having some difficulty sleeping at night.  She has had an increase in nausea and has been trying to take Zofran in order to improve this in addition to her ginger.  She has been waking up at approximately 3 AM throughout the night with pain.  Otherwise she currently denies any fevers, chills, sweats, constipation, diarrhea, or other symptoms at this time.  A full 10 point ROS is listed below.  MEDICAL HISTORY:  Past Medical History:  Diagnosis Date  . Asthma   . Atrophic vaginitis   . Breast cancer, Left 12/20/2010   NO BLOOD PRESSURE CHECKS OR STICKS IN LEFT ARM  . CAD (coronary artery disease)    a. 01/19/2010 s/p CABG x 3, lima->lad, vg->diag, vg->om1;  b. 06/2011 :Lexi MV: EF 84%, No ischemia. c. 01/06/14 s/p negative nuclear stress test with EF >70%  . Cervical arthritis   . Colonic polyp 04-27-2009   tubular adenoma  . Diverticulosis of colon (without mention of hemorrhage)   .  Fibromyalgia   . Gallstones   . GERD (gastroesophageal reflux disease)   . Glaucoma   . Headache(784.0)    a. frequently assocaited with high BPs  . Hiatal hernia   . Hypercholesterolemia   . Irritable bowel syndrome   . Labile hypertension   . Lymphedema    left arm  . Pancreatic cancer ([place]) dx'd 11/2018  . Pinched vertebral nerve   .  Secondary diabetes ([place]) 12/03/2018  . Sleep apnea     SURGICAL HISTORY: Past Surgical History:  Procedure Laterality Date  . [place] STUDY N/A 01/08/2016   Procedure: [place] STUDY;  Surgeon: Ladene Artist, MD;  Location: [facility] ENDOSCOPY;  Service: Endoscopy;  Laterality: N/A;  . BILIARY BRUSHING  11/28/2018   Procedure: BILIARY BRUSHING;  Surgeon: Gatha Mayer, MD;  Location: [facility] ENDOSCOPY;  Service: Endoscopy;;  . BILIARY DILATION  01/06/2019   Procedure: BILIARY DILATION;  Surgeon: Irving Copas., MD;  Location: [facility] ENDOSCOPY;  Service: Endoscopy;;  . BILIARY STENT PLACEMENT  11/28/2018   Procedure: BILIARY STENT PLACEMENT;  Surgeon: Gatha Mayer, MD;  Location: [facility] ENDOSCOPY;  Service: Endoscopy;;  . BILIARY STENT PLACEMENT N/A 01/06/2019   Procedure: BILIARY STENT PLACEMENT;  Surgeon: Irving Copas., MD;  Location: [facility] ENDOSCOPY;  Service: Endoscopy;  Laterality: N/A;  . BILIARY STENT PLACEMENT N/A 08/25/2019   Procedure: BILIARY STENT PLACEMENT;  Surgeon: Jackquline Denmark, MD;  Location: [facility]OSCOPY;  Service: Endoscopy;  Laterality: N/A;  . BREAST LUMPECTOMY Left 02/06/11  . CATARACT EXTRACTION, BILATERAL     bilateral caaract removal,  . COLONOSCOPY    . CORONARY ANGIOPLASTY WITH STENT PLACEMENT     Stent 2007  . CORONARY ARTERY BYPASS GRAFT    . ENDOSCOPIC RETROGRADE CHOLANGIOPANCREATOGRAPHY (ERCP) WITH PROPOFOL N/A 01/06/2019   Procedure: ENDOSCOPIC RETROGRADE CHOLANGIOPANCREATOGRAPHY (ERCP) WITH PROPOFOL;  Surgeon: Rush Landmark Telford Nab., MD;  Location: [facility]OS[facility];  Service: Endoscopy;  Laterality: N/A;  [id number]  . ERCP N/A 11/28/2018   Procedure: ENDOSCOPIC RETROGRADE CHOLANGIOPANCREATOGRAPHY (ERCP);  Surgeon: Gatha Mayer, MD;  Location: [facility] ENDOSCOPY;  Service: Endoscopy;  Laterality: N/A;  . ERCP N/A 08/25/2019   Procedure: ERCP with stent  ;  Surgeon: Jackquline Denmark, MD;  Location: [facility] ENDOS[facility];  Service: Endoscopy;  Laterality: N/A;  . ESOPHAGEAL  MANOMETRY N/A 01/08/2016   Procedure: ESOPHAGEAL MANOMETRY (EM);  Surgeon: Ladene Artist, MD;  Location: [facility]OS[facility];  Service: Endoscopy;  Laterality: N/A;  . ESOPHAGOGASTRODUODENOSCOPY    . ESOPHAGOGASTRODUODENOSCOPY N/A 01/06/2019   Procedure: ESOPHAGOGASTRODUODENOSCOPY (EGD);  Surgeon: Rush Landmark Telford Nab., MD;  Location: [facility] END[facility];  Service: Endoscopy;  Laterality: N/A;  . ESOPHAGOGASTRODUODENOSCOPY N/A 08/25/2019   Procedure: ESOPHAGOGASTRODUODENOSCOPY (EGD);  Surgeon: Jackquline Denmark, MD;  Location: [facility] ENDOSCOPY;  Service: Endoscopy;  Laterality: N/A;  . ESOPHAGOGASTRODUODENOSCOPY (EGD) WITH PROPOFOL N/A 12/10/2018   Procedure: ESOPHAGOGASTRODUODENOSCOPY (EGD) WITH PROPOFOL;  Surgeon: [name], MD;  Location: [facility];  Service: Endoscopy;  Laterality: N/A;  . EUS N/A 12/10/2018   Procedure: UPPER ENDOSCOPIC ULTRASOUND (EUS) RADIAL;  Surgeon: [name], MD;  Location: [facility];  Service: Endoscopy;  Laterality: N/A;  . EUS N/A 01/06/2019   Procedure: FULL UPPER ENDOSCOPIC ULTRASOUND (EUS) RADIAL;  Surgeon: Rush Landmark Telford Nab., MD;  Location: [facility] END[facility];  Service: Endoscopy;  Laterality: N/A;  . FIDUCIAL MARKER PLACEMENT  01/06/2019   Procedure: FIDUCIAL MARKER PLACEMENT;  Surgeon: Rush Landmark Telford Nab., MD;  Location: [facility];  Service: Endoscopy;;  . FINE NEEDLE ASPIRATION N/A  12/10/2018   Procedure: FINE NEEDLE ASPIRATION (FNA) LINEAR;  Surgeon: [name], MD;  Location: [facility] ENDOS[facility];  Service: Endoscopy;  Laterality: N/A;  . REMOVAL OF STONES  08/25/2019   Procedure: REMOVAL OF STONES;  Surgeon: Jackquline Denmark, MD;  Location: [facility] ENDOS[facility];  Service: Endoscopy;;  . [name]  01/06/2019   Procedure: [name];  Surgeon: Mansouraty, Telford Nab., MD;  Location: [facility] ENDOS[facility];  Service: Endoscopy;;  . STENT REMOVAL  01/06/2019   Procedure: STENT REMOVAL;  Surgeon: Irving Copas., MD;  Location: [facility] ENDOSCOPY;  Service:  Endoscopy;;    ALLERGIES:  is allergic to choline fenofibrate, hctz [hydrochlorothiazide], simvastatin, levaquin [levofloxacin in d5w], adhesive [tape], bentyl [dicyclomine hcl], ceclor [cefaclor], clarithromycin, codeine, doxycycline, hydrocodone, lisinopril, penicillins, and tobramycin-dexamethasone.  MEDICATIONS:  Current Outpatient Medications  Medication Sig Dispense Refill  . acetaminophen (TYLENOL) 325 MG tablet Take 650 mg by mouth every 6 (six) hours as needed.    [name] albuterol (VENTOLIN HFA) 108 (90 Base) MCG/ACT inhaler Inhale 2 puffs into the lungs every 6 (six) hours as needed for wheezing or shortness of breath.     . EPINEPHrine 0.3 mg/0.3 mL IJ SOAJ injection Inject 0.3 mg into the muscle as needed (as directed- FOR A SEVERE REACTION).     . ferrous sulfate 325 (65 FE) MG tablet Take 1 tablet (325 mg total) by mouth daily with breakfast. Please take with a source of Vitamin C 90 tablet 3  . fexofenadine (ALLEGRA) 180 MG tablet Take 180 mg by mouth every other day.     . Glucosamine-Chondroit-Vit C-Mn (GLUCOSAMINE CHONDR 1500 COMPLX PO) Take 1 tablet by mouth 2 (two) times daily.     . hydrALAZINE (APRESOLINE) 50 MG tablet Take 0.5 tablets (25 mg total) by mouth 3 (three) times daily. 135 tablet 1  . latanoprost (XALATAN) 0.005 % ophthalmic solution Place 1 drop into both eyes at bedtime.     . lidocaine (XYLOCAINE) 5 % ointment Apply 1 application topically 4 (four) times daily as needed. Apply a pea sized amount. 30 g 0  . losartan (COZAAR) 50 MG tablet Take 1 tablet (50 mg total) by mouth daily. 90 tablet 3  . metoprolol tartrate (LOPRESSOR) 50 MG tablet Take 1 tablet (50 mg total) by mouth 3 (three) times daily. 270 tablet 3  . ondansetron (ZOFRAN) 8 MG tablet Take 1 tablet (8 mg total) by mouth every 8 (eight) hours as needed for nausea or vomiting. 30 tablet 1  . oxyCODONE (OXY IR/ROXICODONE) 5 MG immediate release tablet Take 1 tablet (5 mg total) by mouth every 6 (six) hours  as needed for severe pain. 90 tablet 0  . oxyCODONE (OXYCONTIN) 15 mg 12 hr tablet Take 1 tablet (15 mg total) by mouth every 12 (twelve) hours. 90 tablet 0  . pantoprazole (PROTONIX) 40 MG tablet Take 1 tablet (40 mg total) by mouth daily. 30 tablet 5  . Peppermint Oil (IBGARD PO) Take by mouth. 1-2 two times a day    . prochlorperazine (COMPAZINE) 10 MG tablet Take 1 tablet (10 mg total) by mouth every 6 (six) hours as needed for nausea or vomiting. 30 tablet 0  . Psyllium (METAMUCIL FIBER PO) Take 1 Scoop by mouth daily. Mix one rounded teaspoonful into 8 oz water and drink once a day    . simethicone (MYLICON) 096 MG chewable tablet Chew 125 mg by mouth every 6 (six) hours as needed for flatulence.    . sodium chloride (OCEAN) 0.65 % SOLN nasal  spray Place 1 spray into both nostrils daily as needed for congestion.     . sucralfate (CARAFATE) 1 g tablet TAKE 1 TABLET(1 GRAM) BY MOUTH TWICE DAILY 180 tablet 0  . traMADol (ULTRAM) 50 MG tablet Take 1-2 tablets (50-100 mg total) by mouth every 6 (six) hours as needed. 60 tablet 0  . valACYclovir (VALTREX) 1000 MG tablet Take 1 tablet (1,000 mg total) by mouth 2 (two) times daily. 20 tablet 0  . vitamin B-12 (CYANOCOBALAMIN) 1000 MCG tablet Take 1,000 mcg by mouth daily.      [name] VITAMIN D PO Take 200 mcg by mouth daily with breakfast. Vitamin A  300 mcg Calcium  40 mcg     No current facility-administered medications for this visit.    REVIEW OF SYSTEMS:   Constitutional: ( - ) fevers, ( - )  chills , ( - ) night sweats Eyes: ( - ) blurriness of vision, ( - ) double vision, ( - ) watery eyes Ears, nose, mouth, throat, and face: ( - ) mucositis, ( - ) sore throat Respiratory: ( - ) cough, ( - ) dyspnea, ( - ) wheezes Cardiovascular: ( - ) palpitation, ( - ) chest discomfort, ( - ) lower extremity swelling Gastrointestinal:  ( - ) nausea, ( - ) heartburn, ( - ) change in bowel habits Skin: ( - ) abnormal skin rashes Lymphatics: ( - ) new  lymphadenopathy, ( - ) easy bruising Neurological: ( - ) numbness, ( - ) tingling, ( - ) new weaknesses Behavioral/Psych: ( - ) mood change, ( - ) new changes  All other systems were reviewed with the patient and are negative.  PHYSICAL EXAMINATION: ECOG PERFORMANCE STATUS: 2 - Symptomatic, <50% confined to bed  Vitals:   12/22/19 1040  BP: (!) 156/70  Resp: 18  Temp: (!) 97.5 F (36.4 C)  SpO2: 100%   Filed Weights   12/22/19 1040  Weight: 113 lb 4.8 oz (51.4 kg)    GENERAL: pleasant, well appearing elderly Caucasian female in NAD  SKIN: skin color, texture, turgor are normal, no rashes or significant lesions EYES: conjunctiva are pink and non-injected. No jaundice evident in the sclera.  LUNGS: clear to auscultation and percussion with normal breathing effort HEART: regular rate & rhythm and no murmurs and no lower extremity edema ABDOMEN: soft and non-distended. Palpable epigastric abdominal mass.  Musculoskeletal: no cyanosis of digits and no clubbing  PSYCH: alert & oriented x 3, fluent speech NEURO: no focal motor/sensory deficits  LABORATORY DATA:  I have reviewed the data as listed CMP Latest Ref Rng & Units 12/22/2019 11/17/2019 10/06/2019  Glucose 70 - 99 mg/dL 168(H) 148(H) 155(H)  BUN 8 - 23 mg/dL <4(L) 7(L) 9  Creatinine 0.44 - 1.00 mg/dL 0.68 0.73 0.74  Sodium 135 - 145 mmol/L 131(L) 133(L) 133(L)  Potassium 3.5 - 5.1 mmol/L 3.9 4.4 4.2  Chloride 98 - 111 mmol/L 94(L) 94(L) 97(L)  CO2 22 - 32 mmol/L $RemoveB'29 31 27  'JyaFhKAV$ Calcium 8.9 - 10.3 mg/dL 9.5 9.6 9.6  Total Protein 6.5 - 8.1 g/dL 7.2 7.3 7.0  Total Bilirubin 0.3 - 1.2 mg/dL 0.8 0.9 0.8  Alkaline Phos 38 - 126 U/L 124 155(H) 124  AST 15 - 41 U/L $Remo'15 27 22  'DTAfF$ ALT 0 - 44 U/L $Remo'12 30 21   'sKVOS$ CBC Latest Ref Rng & Units 12/22/2019 11/17/2019 10/06/2019  WBC 4.0 - 10.5 K/uL 5.9 5.9 4.8  Hemoglobin 12.0 - 15.0 g/dL 11.6(L) 10.8(L)  10.5(L)  Hematocrit 36 - 46 % 35.3(L) 32.9(L) 31.3(L)  Platelets 150 - 400 K/uL 310 296 258     RADIOGRAPHIC STUDIES:  CLINICAL DATA:  Epigastric abdominal pain. Blood in stool. Pancreatic adenocarcinoma. Remote history of breast cancer.  EXAM: CT ABDOMEN AND PELVIS WITH CONTRAST  TECHNIQUE: Multidetector CT imaging of the abdomen and pelvis was performed using the standard protocol following bolus administration of intravenous contrast.  CONTRAST:  131mL OMNIPAQUE IOHEXOL 300 MG/ML  SOLN  COMPARISON:  08/03/2019  FINDINGS: Lower chest: Stable scarring or subsegmental atelectasis in the lung bases. Centrilobular emphysema. Old granulomatous disease. Descending thoracic aortic atherosclerotic calcification.  Hepatobiliary: Pneumobilia. Biliary stent remains in place. No compelling findings of metastatic disease to the liver. No overt gallbladder wall thickening.  Pancreas: Prominently dilated dorsal pancreatic duct similar to prior, extending to an infiltrative mass in the head of the pancreas which surrounds the stent. Several fiducials remain in place. Overall the pancreatic mass does not appear significantly changed, measuring roughly 3.1 by 2.8 cm on image 26/2. Mild surrounding inflammatory stranding along the pancreatic head.  Spleen: Unchanged with 3 small hypodense lesions which are probably benign but technically nonspecific.  Adrenals/Urinary Tract: Unremarkable  Stomach/Bowel: Indistinctness of tissue planes around the pancreatic head and adjacent portions of the duodenum, pancreatitis and duodenitis not exclude and some of this may be related to prior radiation therapy. Overall the degree of stranding around the descending and transverse duodenum is similar to 08/03/2019 and no dilation of the stomach or bowel is observed.  Sigmoid colon diverticulosis is present. No active diverticulitis identified.  Vascular/Lymphatic: The pancreatic head mass narrows the confluence of the splenic vein and the SMV into the portal vein.  Aortoiliac atherosclerotic vascular disease. Stable prominence of parametrial venous structures, left greater than right, pelvic congestion syndrome not excluded.  Reproductive: Unremarkable  Other: Low-grade stranding in the central mesentery, minimally increased from prior, nonspecific.  Musculoskeletal: Degenerative arthropathy of both hips. Grade 1 degenerative anterolisthesis at L4-5. Multilevel lumbar spondylosis and degenerative disc disease with left greater than right foraminal impingement at L5-S1. central narrowing of the thecal sac at the L4-5 level.  IMPRESSION: 1. Similar appearance of the infiltrative mass in the head of the pancreas with surrounding fiducials. The mass narrows the confluence of the splenic vein and the SMV into the portal vein. 2. Indistinctness of tissue planes around the pancreatic head and adjacent portions of the duodenum, pancreatitis and duodenitis could cause this appearance, and radiation therapy in the past may be predisposing. 3. Other imaging findings of potential clinical significance: Sigmoid colon diverticulosis. Stable prominence of parametrial venous structures, left greater than right, pelvic congestion syndrome not excluded. Multilevel lumbar spondylosis and degenerative disc disease causing left greater than right foraminal impingement at L5-S1. 4. Emphysema and aortic atherosclerosis.  Aortic Atherosclerosis (ICD10-I70.0) and Emphysema (ICD10-J43.9).   Electronically Signed   By: Van Clines M.D.   On: 08/24/2019 14:58  ASSESSMENT & PLAN AVAYA MCJUNKINS 84 y.o. female with medical history significant for CAD s/p CABG in 2011, OSA on CPAP, Breast Cancer, and GERD who presents for a follow up for localized adenocarcinoma of the pancreas.  On exam today [name] is continuing to have worsening abdominal pain, but is otherwise quite stable in terms of weight, appetite, and labs.  We will be adding oxycontin  $RemoveBe'15mg'VPRvTQalS$  BID for better long term pain control. We will be prescribing compazine as well to alternate with zofran for pain control.   We will  continue with symptom management and close monitoring of her labs today give her a better idea of life expectancy/progression of disease. We will see her back in 4 weeks time for continued monitoring.  #Localized Adenocarcinoma of the Pancreas, Stage IA (T1N0M0) --Completed palliatve radiation therapy on 03/18/2019  --after reviewing patients functional status and ECOG 2 I think the patient is possible a candidate for chemotherapy (likely only monotherapy gemcitabine (J Clin Oncol. 1997 Jun; 15(6):2403-13.)). This could be considered, though I do not favor that approach. The patient and her daughter are OK with continued comfort based care. --last CT scan on 08/24/2019 showed increase in size of tumor (3.1 by 2.8 cm).  --for now continued supportive care and symptom management.  --RTC in 4 weeks to assess symptoms.   #Breast Cancer T2 N0, stage IIA invasive ductal carcinoma, grade 3,  ER+/PR+/HER-2 not amplified --completed 84 year old letrozole therapy in Feb 2018 --previously followed by Dr. Gunnar Bulla Magrinat at [facility] --in remission   #Cancer Related Pain, worsening --worsening pain today, still across the top of the abdomen. --recommend taking oxycontin 15mg  BID --patient can take oxycodone 5-10mg  PO q4H PRN for severe pain. She has been taking 10mg  PO oxycodone at night.  --LFTs have normalized, OK to take tylenol up to 3000mg  daily.  --encourage stool softener/miralx if BM becomes less regular.  --will continue to monitor abdominal pain  #Symptom Management --notes light colored stools that float, but no diarrhea. Possible initial signs of pancreatic insufficiency. Continue to monitor and consider pancreatic enzymes vs low fat diet.  --no itching or jaundice today. Continue to monitor. If worsening, could be sign of stent obstruction.  --mild nausea, at  patient's baseline. Treated with ginger. No diarrhea. Will provide zofran 8mg  PO and compazine 10mg  q6H  PRN for nausea.   #Goals Of Care --patient notes she wants to maintain a high quality of life and does not think that surgery/chemotherapy would allow that. I agree with her assessment --referred to [facility] for introductions, assistance with symptoms management, and preliminary hospice discussions.   All questions were answered. The patient knows to call the clinic with any problems, questions or concerns.  A total of more than 30 minutes were spent face-to-face with the patient during this encounter and over half of that time was spent on counseling and coordination of care as outlined above.   Ledell Peoples, MD Department of Hematology/Oncology [place] [facility] Phone: [phone] Pager: [phone] Email: [email or web address]   12/22/2019 11:50 AM

## 2019-12-23 ENCOUNTER — Other Ambulatory Visit: Payer: Self-pay | Admitting: Gastroenterology

## 2019-12-23 ENCOUNTER — Other Ambulatory Visit: Payer: Self-pay | Admitting: Hematology and Oncology

## 2019-12-23 ENCOUNTER — Telehealth: Payer: Self-pay | Admitting: Hematology and Oncology

## 2019-12-23 MED ORDER — XTAMPZA ER 13.5 MG PO C12A: 13.5000 mg | EXTENDED_RELEASE_CAPSULE | Freq: Two times a day (BID) | ORAL | 0 refills | Status: AC

## 2019-12-23 MED FILL — XTAMPZA ER 13.5 MG C12A: 13.5 | 15 days supply | Qty: 30 | Fill #0

## 2019-12-23 NOTE — Telephone Encounter (Signed)
Scheduled per los. Called and spoke with patients daughter. Confirmed appt  °

## 2019-12-28 ENCOUNTER — Telehealth: Payer: Self-pay | Admitting: *Deleted

## 2019-12-28 NOTE — Telephone Encounter (Signed)
Received a call from patient's daughter, [name]. She states that patient is experiencing more pain in her abdomen from her pancreatic cancer. She is taking Oxycodone on a more regular basis. She was prescribed an extended release pain medication to help. She has also been having more issues with nausea. Her order for Zofran was for every 8 hours but was not lasting the full time. Another nausea medication has been added in between Zofran doses but daughter couldn't think of the name of this. She is also starting with some confusion. She says that her Oncologist mentioned hospice on their last visit just to introduce them to the idea. Daughter is thinking that it may be time to transition to hospice care, but wants me to have this conversation with patient in person. Palliative care visit scheduled for 12/29/19 at 4p.

## 2019-12-29 ENCOUNTER — Other Ambulatory Visit: Payer: Self-pay

## 2019-12-29 ENCOUNTER — Other Ambulatory Visit: Payer: Medicare Other | Admitting: *Deleted

## 2019-12-29 DIAGNOSIS — Z515 Encounter for palliative care: Secondary | ICD-10-CM

## 2019-12-30 ENCOUNTER — Other Ambulatory Visit: Payer: Self-pay | Admitting: Gastroenterology

## 2019-12-30 ENCOUNTER — Telehealth: Payer: Self-pay | Admitting: *Deleted

## 2019-12-30 NOTE — Telephone Encounter (Signed)
Received call from Wendall Stade, RN with [facility], requesting ok for hospice referral per pt/family request.  Provided verbal order for Hospice services.  Dr. Lorenso Courier will be the attending for this.  [facility] will send the referral to the Hospice Team.  Dr. Lorenso Courier aware.

## 2020-01-03 NOTE — Progress Notes (Signed)
COMMUNITY PALLIATIVE CARE RN NOTE  PATIENT NAME: [name] DOB: [birth year] MRN: [id number]  PRIMARY CARE PROVIDER: Ronnald Nian, DO  RESPONSIBLE PARTY: [name] (daughter) Acct ID - Guarantor Home Phone Work Phone Relationship Acct Type  [id number] ADAH, [phone]  Self P/F     [address], [place], [place] [address]   Covid-19 Pre-screening Negative  PLAN OF CARE and INTERVENTION:  1. ADVANCE CARE PLANNING/GOALS OF CARE: Goal is for patient to remain at home with her husband. She has a DNR. 2. PATIENT/CAREGIVER EDUCATION: Hospice educations, pain and nausea management, safe mobility 3. DISEASE STATUS: Met with patient, husband and daughter in patient's home. Patient remains alert and oriented x 4, but daughter is noticing some confusion and forgetfulness. She is having more difficulties keeping up with her medications despite the fact that her daughter manages her pill box weekly. She is experiencing stronger, more severe pains in her abdomen from her pancreatic cancer. She is no longer taking Tramadol, as it is ineffective in managing her pain. She is taking Oxycodone 5 mg, 1-2 tablets every 4 hours as needed. Today she has taken 2 tablets at 5am, 1 tablet at 9am and another tablet at 2p, and it is now 4p and her pain is starting to increase again. She takes 2 tablets at bedtime and will still wake up in the middle of the night in pain. She had a recent appointment with her Oncologist where he ordered her Oxycontin, however her insurance denied this medication. He then wrote a script for another pain medication (Oxycodone ER 13.5 mg tabs) and even with her insurance it is $250, so they did not pick up this medication as they are unable to afford this. She is also experiencing more nausea. She used to manage this by chewing on a ginger stick, however she is now requiring Zofran every 8 hours as needed. Zofran was not lasting a full 8 hours so Compazine has been added every  6 hours as needed. She has not vomited, but is currently feeling slight nausea during visit. Her appetite is decreasing. She is only able to now eat small portions and food doesn't taste the same. She has lost 8 lbs over the past 4 months. Current weight 113 lbs. She appears thinner overall, especially in her face, neck and upper body. Her clothes are fitting more loosely. She remains ambulatory and able to perform ADLs independently, but these tasks are becoming more difficult for her to complete. She is taking more naps during the day and feels tired the majority of the time. She states that Dr. Lorenso Courier (Oncologist) has mentioned hospice in the past to introduce this idea to patient and she is now ready to transition to hospice care since her symptoms are worsening. Will contact Dr. Libby Maw office in the am to request an order for a hospice consult since it is now 5p.   HISTORY OF PRESENT ILLNESS: This is a 84 yo female with a diagnosis of Pancreatic cancer. Palliative care team continues to follow patient at this time, but will contact Dr. Lorenso Courier for hospice consult order in the am.   CODE STATUS: DNR ADVANCED DIRECTIVES: N MOST FORM: yes PPS: 50%   PHYSICAL EXAM:   LUNGS: clear to auscultation  CARDIAC: Cor RRR EXTREMITIES: No edema SKIN: Exposed skin is dry and intact  NEURO: Alert and oriented x 4, forgetful, increased generalized weakness, ambulatory    (Duration of visit and documentation 75 minutes)   Daryl Eastern, RN  BSN

## 2020-01-04 ENCOUNTER — Other Ambulatory Visit: Payer: Self-pay | Admitting: Hematology and Oncology

## 2020-01-04 ENCOUNTER — Telehealth: Payer: Self-pay | Admitting: *Deleted

## 2020-01-04 MED ORDER — OXYCODONE HCL 5 MG PO TABS
5.0000 mg | ORAL_TABLET | Freq: Four times a day (QID) | ORAL | 0 refills | Status: AC | PRN
Start: 2020-01-04 — End: ?

## 2020-01-04 MED FILL — oxyCODONE HCL 5 MG TABS: 5 | 15 days supply | Qty: 120 | Fill #0

## 2020-01-04 NOTE — Telephone Encounter (Signed)
Patients daughter [name] called.  Patient was presribed Oxycodone 5 mg for pain.  She was also prescribed Oxycodone ER but was unable to afford that.  Daughter states that they have been dosing 1-2 tablets every 6hours which is not as written.  Patient is complaining of severe pain in her lower back and abdomen.    Only has enough pills to last for this evening.   Patient is enrolling in hospice today.  Hospice was at home while she called into our office.  She is unsure if Hospice can order the pain medication this quickly since she is enrolling today.  Rx would need to be sent to [facility].    Routed to MD to review and advise.

## 2020-01-05 MED FILL — PANTOPRAZOLE SOD DR 40 MG T: 40 | 30 days supply | Qty: 30 | Fill #3

## 2020-01-14 MED FILL — MORPHINE SULF ER 15 MG TAB: 15 | 15 days supply | Qty: 30 | Fill #0

## 2020-01-14 MED FILL — LOSARTAN POTASSIUM 50 MG TA: 50 | 90 days supply | Qty: 90 | Fill #1

## 2020-01-21 MED FILL — PROCHLORPERAZINE 10 MG TAB: 10 | 15 days supply | Qty: 60 | Fill #0

## 2020-01-26 MED FILL — MORPHINE SULF ER 15 MG TAB: 15 | 30 days supply | Qty: 60 | Fill #0

## 2020-01-28 ENCOUNTER — Inpatient Hospital Stay: Payer: Medicare Other

## 2020-01-28 ENCOUNTER — Inpatient Hospital Stay: Payer: Medicare Other | Admitting: Hematology and Oncology

## 2020-01-31 MED FILL — oxyCODONE HCL 5 MG TABS: 5 | 20 days supply | Qty: 120 | Fill #0

## 2020-01-31 MED FILL — PANTOPRAZOLE SOD DR 40 MG T: 40 | 30 days supply | Qty: 30 | Fill #4

## 2020-02-08 MED FILL — METOPROLOL TARTRATE 50 MG T: 50 | 90 days supply | Qty: 270 | Fill #1

## 2020-02-24 MED FILL — ONDANSETRON HCL 8 MG TABLET: 8 | 10 days supply | Qty: 30 | Fill #1

## 2020-02-25 MED FILL — PROCHLORPERAZINE 10 MG TAB: 10 | 15 days supply | Qty: 60 | Fill #1

## 2020-02-25 MED FILL — MORPHINE SULF ER 15 MG TAB: 15 | 30 days supply | Qty: 60 | Fill #0

## 2020-03-03 MED FILL — PANTOPRAZOLE SOD DR 40 MG T: 40 | 30 days supply | Qty: 30 | Fill #5

## 2020-03-08 ENCOUNTER — Ambulatory Visit: Payer: Medicare Other | Admitting: Cardiovascular Disease

## 2020-03-15 MED FILL — LORazepam 0.5 MG TABS: 0.5 | 5 days supply | Qty: 30 | Fill #0

## 2020-03-15 MED FILL — MORPHINE SULF 100 MG/5 ML S: 100 | 20 days supply | Qty: 30 | Fill #0

## 2020-03-16 MED FILL — HYOSCYAMINE SULFATE 0.125 M: 0.125 | 5 days supply | Qty: 30 | Fill #0

## 2020-03-21 MED FILL — OSCIMIN 0.125 MG SUBL: 0.125 | 5 days supply | Qty: 30 | Fill #0

## 2020-03-22 ENCOUNTER — Telehealth: Payer: Self-pay | Admitting: Neurology

## 2020-03-22 MED FILL — LORazepam 0.5 MG TABS: 0.5 | 5 days supply | Qty: 30 | Fill #1

## 2020-03-23 ENCOUNTER — Telehealth: Payer: Medicare Other | Admitting: Neurology

## 2020-03-24 ENCOUNTER — Telehealth: Payer: Self-pay | Admitting: *Deleted

## 2020-03-24 NOTE — Telephone Encounter (Signed)
Received call from pt's daughter, [name].  She is asking about the process for obtaining FMLA from her work to help take care of her mother.  Advised to fax the forms to [place] @ [phone] or [place] states her mom continues to be with hospice and is currently unresponsive. She has gone 9 days without any food/water. They are expecting her to pass away in the next few days. Expressed to her that Dr. Lorenso Courier and I think of her often and we enjoyed seeing her in the office.  Asked to have her or her sister, [name] to call us with any needs, and when she passes away. Izora Gala said she would.  TCT daughter, [name] and spoke with her. She is grateful the call and grateful for the care she has received from Dr. Lorenso Courier and Valerie Roys says it is just a matter of days now as her breathing has changed and is having more pauses in her breathing. She said she would call us next week to let us know how things are.

## 2020-03-25 MED FILL — MORPHINE SULF 100 MG/5 ML S: 100 | 5 days supply | Qty: 30 | Fill #0

## 2020-03-27 MED FILL — LORazepam 0.5 MG TABS: 0.5 | 5 days supply | Qty: 30 | Fill #2

## 2020-03-29 MED FILL — MORPHINE SULF 100 MG/5 ML S: 100 | 3 days supply | Qty: 30 | Fill #0

## 2020-03-29 MED FILL — ATROPINE 1% EYE DROPS: 1 | 4 days supply | Qty: 5 | Fill #0

## 2020-04-25 DEATH — deceased

## 2020-11-15 IMAGING — CR DG LUMBAR SPINE COMPLETE 4+V
5 series · 5 of 5 positions shown · non-contrast
Comparison: Right hip radiographs-earlier same day; CT abdomen
pelvis-08/24/2019

CLINICAL DATA: Post fall, now with pain involving the low back,
right hip and coccyx.

EXAM:
LUMBAR SPINE - COMPLETE 4+ VIEW

[t l-spine a.p.]
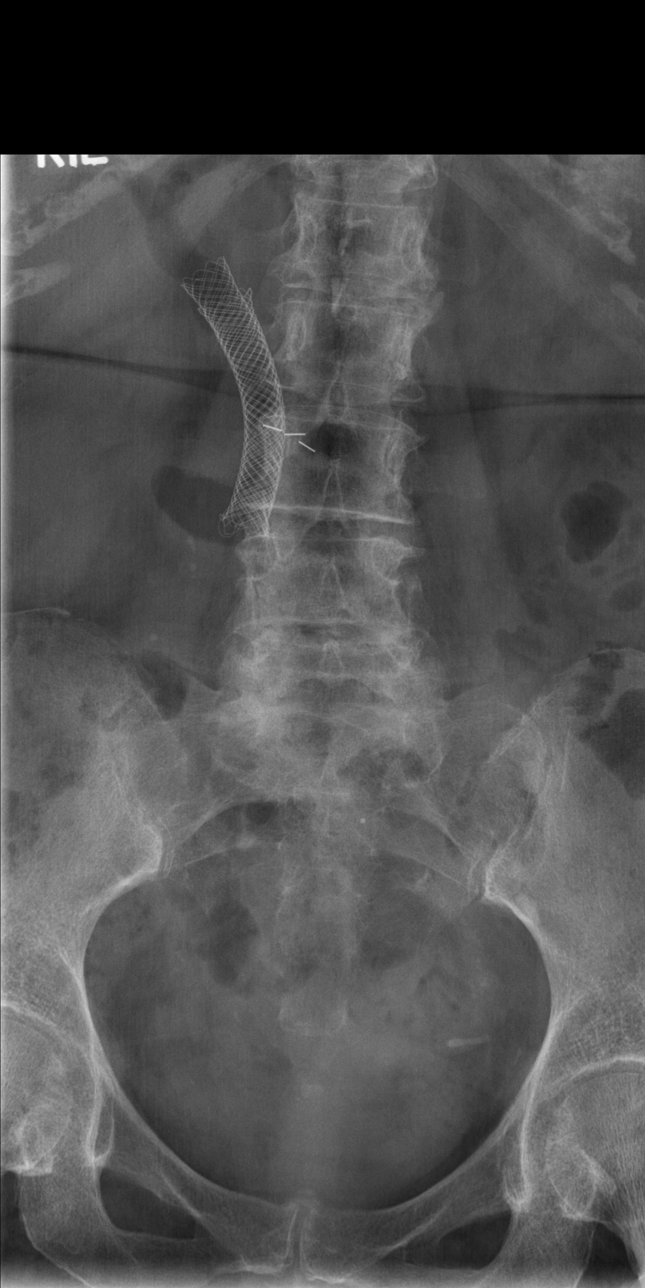

[t l-spine oblique exposure (1 of 2)]
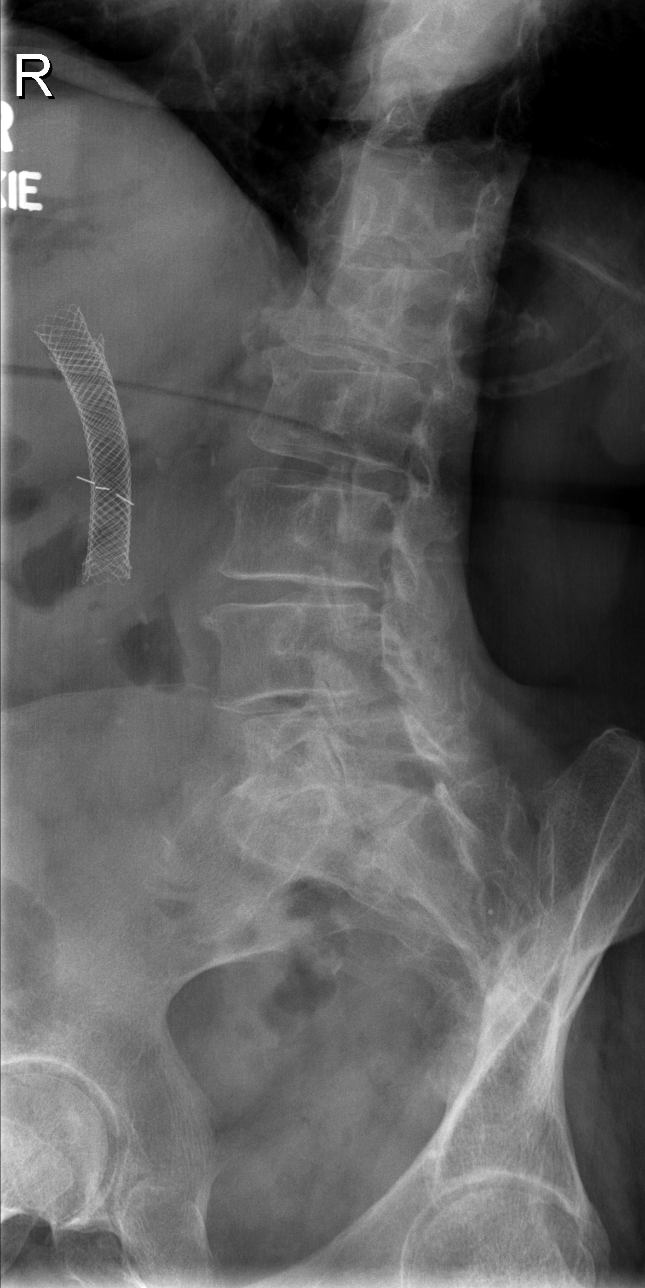

[t l-spine oblique exposure (2 of 2)]
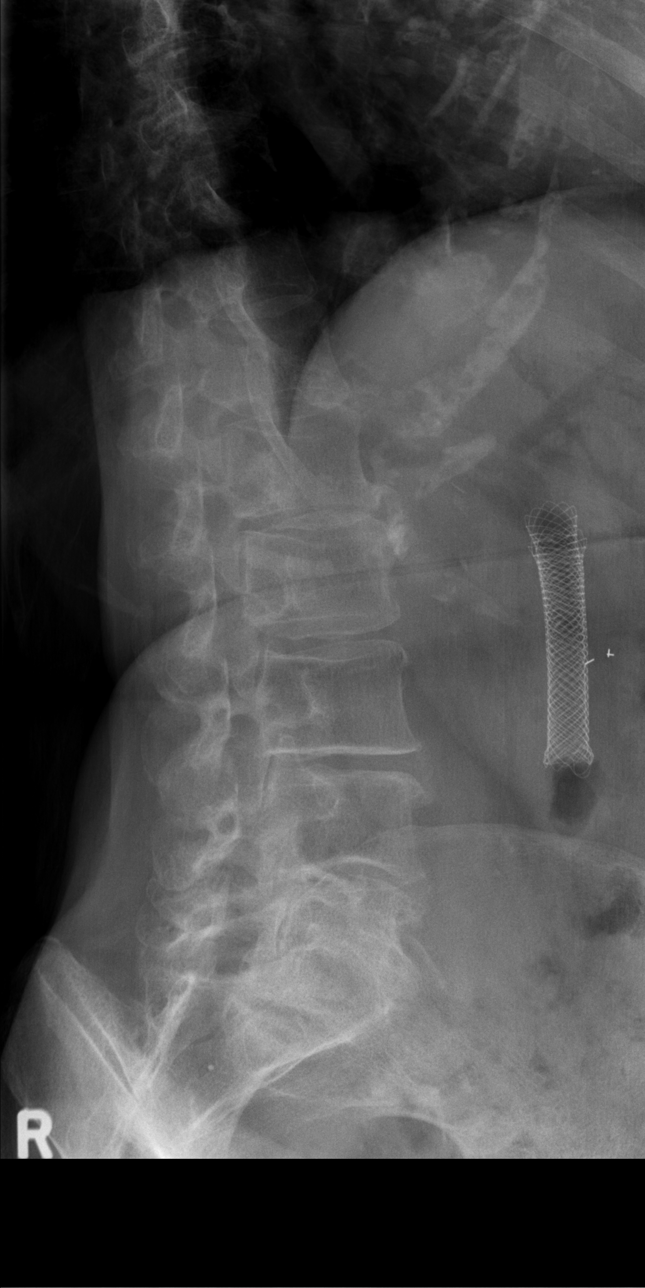

[t l-spine lat]
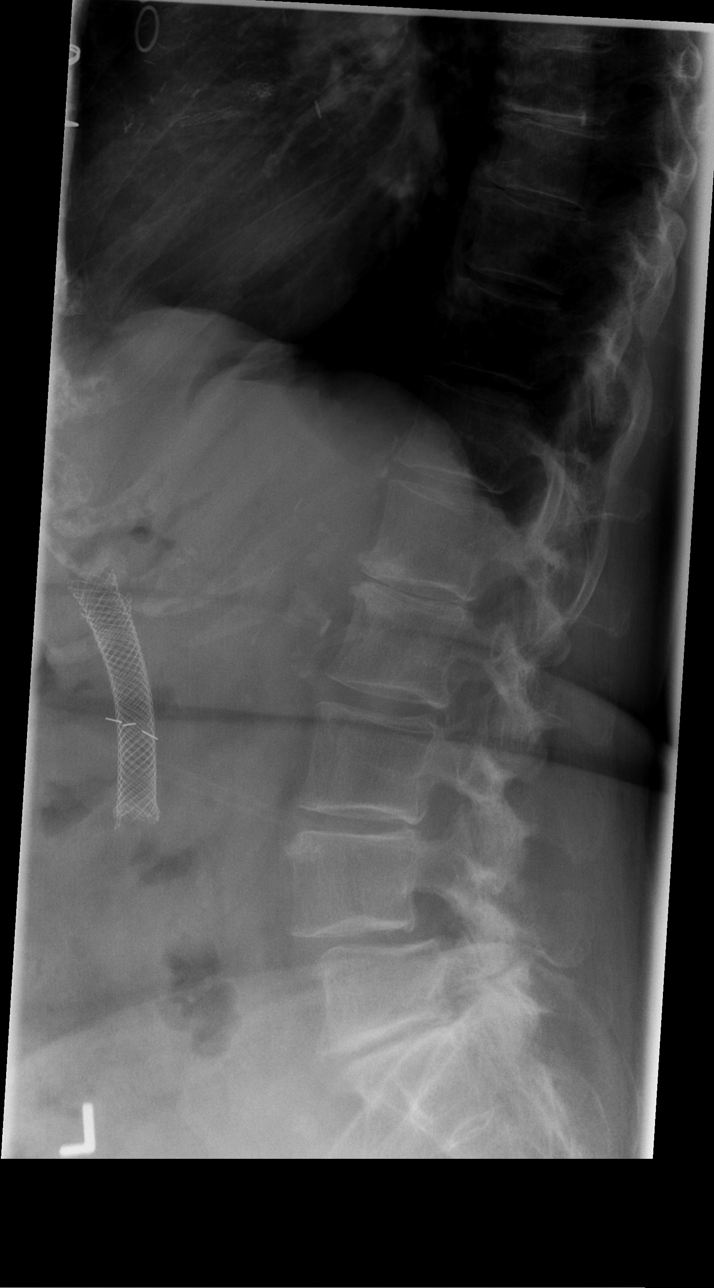

[t l-spine l5-s1 spot]
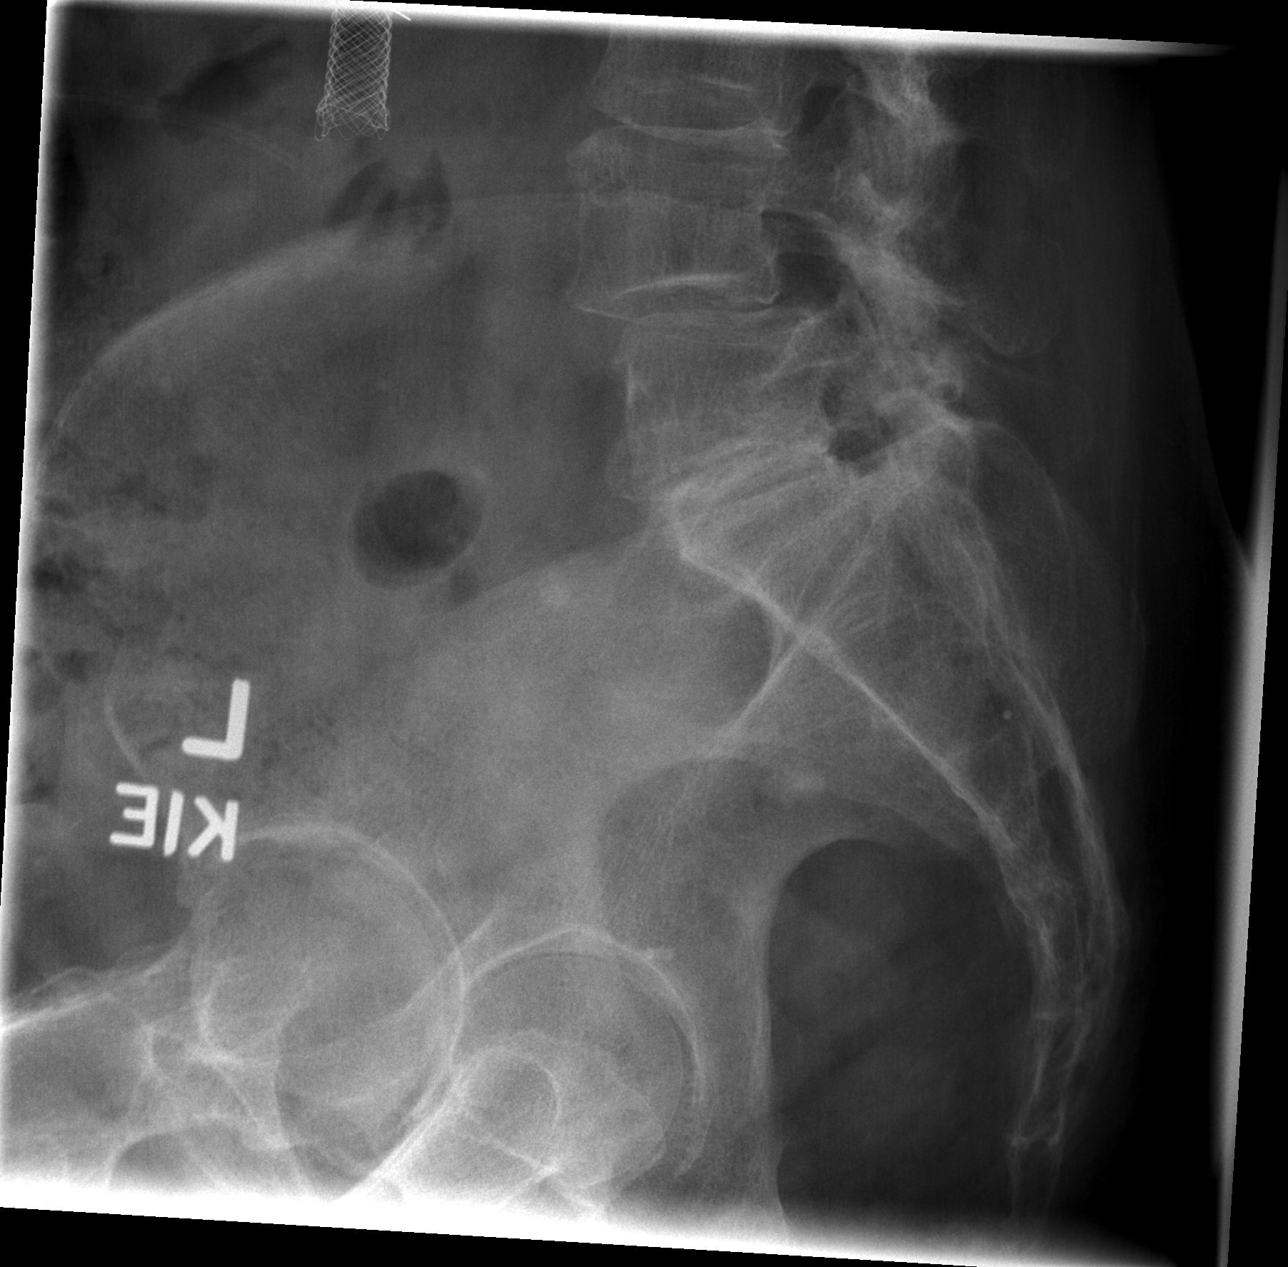

[5 of 5 positions shown; findings below may reference images not displayed]

FINDINGS: There are 5 non rib-bearing lumbar type vertebral bodies

Mild scoliotic curvature of the thoracolumbar spine with dominant
caudal component convex to the right measuring approximately 6
degrees (as measured from the superior endplate of L2 to the
inferior endplate of L4).

Grade 1 anterolisthesis of L4 upon L5 measuring approximately 7 mm
without associated pars defects. No retrolisthesis.

Mild (approximately 25%) compression deformity involving the L5
vertebral body appears similar to abdominal CT performed 08/24/2019.
Remaining lumbar vertebral body heights appear preserved.

Mild to moderate multilevel lumbar spine DDD, worse at L1-L2, L4-L5
and L5-S1 with disc space height loss, endplate irregularity and
sclerosis.

Limited visualization of the bilateral SI joints, sacrum and coccyx
is normal.

A biliary stent overlies expected location of the CBD with expected
pneumobilia. Nonobstructive bowel gas pattern.
IMPRESSION: 1. No definite acute findings.
2. Mild-to-moderate multilevel lumbar spine DDD worse at L1-L2,
L4-L5 and L5-S1.
